# Patient Record
Sex: Male | Born: 1945
Health system: Southern US, Community
[De-identification: ages and names within clinical notes are randomized; demographics above are authoritative.]

## PROBLEM LIST (undated history)

## (undated) DIAGNOSIS — I5022 Chronic systolic (congestive) heart failure: Secondary | ICD-10-CM

## (undated) DIAGNOSIS — R042 Hemoptysis: Secondary | ICD-10-CM

## (undated) DIAGNOSIS — J9620 Acute and chronic respiratory failure, unspecified whether with hypoxia or hypercapnia: Secondary | ICD-10-CM

## (undated) DIAGNOSIS — E119 Type 2 diabetes mellitus without complications: Secondary | ICD-10-CM

## (undated) DIAGNOSIS — J90 Pleural effusion, not elsewhere classified: Secondary | ICD-10-CM

## (undated) DIAGNOSIS — D72829 Elevated white blood cell count, unspecified: Secondary | ICD-10-CM

## (undated) DIAGNOSIS — I472 Ventricular tachycardia, unspecified: Secondary | ICD-10-CM

## (undated) DIAGNOSIS — I739 Peripheral vascular disease, unspecified: Secondary | ICD-10-CM

## (undated) DIAGNOSIS — I2510 Atherosclerotic heart disease of native coronary artery without angina pectoris: Secondary | ICD-10-CM

## (undated) DIAGNOSIS — E785 Hyperlipidemia, unspecified: Secondary | ICD-10-CM

## (undated) DIAGNOSIS — I10 Essential (primary) hypertension: Secondary | ICD-10-CM

## (undated) DIAGNOSIS — I2699 Other pulmonary embolism without acute cor pulmonale: Secondary | ICD-10-CM

## (undated) DIAGNOSIS — I959 Hypotension, unspecified: Secondary | ICD-10-CM

## (undated) DIAGNOSIS — I255 Ischemic cardiomyopathy: Secondary | ICD-10-CM

## (undated) DIAGNOSIS — R7989 Other specified abnormal findings of blood chemistry: Secondary | ICD-10-CM

## (undated) DIAGNOSIS — N200 Calculus of kidney: Secondary | ICD-10-CM

## (undated) DIAGNOSIS — I82401 Acute embolism and thrombosis of unspecified deep veins of right lower extremity: Secondary | ICD-10-CM

## (undated) DIAGNOSIS — I779 Disorder of arteries and arterioles, unspecified: Secondary | ICD-10-CM

## (undated) DIAGNOSIS — M459 Ankylosing spondylitis of unspecified sites in spine: Secondary | ICD-10-CM

---

## 2011-07-17 ENCOUNTER — Inpatient Hospital Stay: Payer: Self-pay | Admitting: Internal Medicine

## 2011-07-17 LAB — CK TOTAL AND CKMB (NOT AT ARMC): CK, Total: 123 U/L (ref 35–232)

## 2011-07-17 LAB — CBC
HCT: 43.1 % (ref 40.0–52.0)
HGB: 14.3 g/dL (ref 13.0–18.0)
RBC: 4.53 10*6/uL (ref 4.40–5.90)
WBC: 13.5 10*3/uL — ABNORMAL HIGH (ref 3.8–10.6)

## 2011-07-17 LAB — PROTIME-INR
INR: 0.9
Prothrombin Time: 12.4 secs (ref 11.5–14.7)

## 2011-07-17 LAB — COMPREHENSIVE METABOLIC PANEL
Albumin: 4.1 g/dL (ref 3.4–5.0)
Alkaline Phosphatase: 102 U/L (ref 50–136)
BUN: 23 mg/dL — ABNORMAL HIGH (ref 7–18)
Bilirubin,Total: 0.4 mg/dL (ref 0.2–1.0)
Calcium, Total: 9.2 mg/dL (ref 8.5–10.1)
Chloride: 105 mmol/L (ref 98–107)
Co2: 27 mmol/L (ref 21–32)
Creatinine: 0.94 mg/dL (ref 0.60–1.30)
EGFR (Non-African Amer.): >60
Osmolality: 292 (ref 275–301)
Potassium: 3.9 mmol/L (ref 3.5–5.1)
Sodium: 143 mmol/L (ref 136–145)
Total Protein: 8.1 g/dL (ref 6.4–8.2)

## 2011-07-17 LAB — APTT: Activated PTT: 26.6 secs (ref 23.6–35.9)

## 2011-07-17 LAB — TROPONIN I: Troponin-I: 0.32 ng/mL — ABNORMAL HIGH

## 2011-07-17 LAB — LIPASE, BLOOD: Lipase: 133 U/L (ref 73–393)

## 2011-07-18 LAB — BASIC METABOLIC PANEL
Anion Gap: 13 (ref 7–16)
Calcium, Total: 8.4 mg/dL — ABNORMAL LOW (ref 8.5–10.1)
Co2: 25 mmol/L (ref 21–32)
Creatinine: 0.87 mg/dL (ref 0.60–1.30)
Osmolality: 286 (ref 275–301)

## 2011-07-18 LAB — CK TOTAL AND CKMB (NOT AT ARMC)
CK, Total: 2934 U/L — ABNORMAL HIGH (ref 35–232)
CK-MB: 406.1 ng/mL — ABNORMAL HIGH (ref 0.5–3.6)
CK-MB: 314.3 ng/mL — ABNORMAL HIGH (ref 0.5–3.6)

## 2011-07-18 LAB — TROPONIN I: Troponin-I: >40 ng/mL

## 2011-08-07 ENCOUNTER — Ambulatory Visit: Payer: Self-pay | Admitting: Internal Medicine

## 2011-08-16 ENCOUNTER — Other Ambulatory Visit: Payer: Self-pay | Admitting: Internal Medicine

## 2011-08-16 LAB — COMPREHENSIVE METABOLIC PANEL
Albumin: 4.1 g/dL (ref 3.4–5.0)
Alkaline Phosphatase: 112 U/L (ref 50–136)
Anion Gap: 6 — ABNORMAL LOW (ref 7–16)
Bilirubin,Total: 0.4 mg/dL (ref 0.2–1.0)
Co2: 25 mmol/L (ref 21–32)
Creatinine: 0.98 mg/dL (ref 0.60–1.30)
EGFR (Non-African Amer.): >60
Glucose: 118 mg/dL — ABNORMAL HIGH (ref 65–99)
Osmolality: 280 (ref 275–301)
SGOT(AST): 31 U/L (ref 15–37)
Sodium: 138 mmol/L (ref 136–145)

## 2011-08-16 LAB — LIPID PANEL
HDL Cholesterol: 47 mg/dL (ref 40–60)
VLDL Cholesterol, Calc: 17 mg/dL (ref 5–40)

## 2012-01-30 ENCOUNTER — Other Ambulatory Visit: Payer: Self-pay | Admitting: Internal Medicine

## 2012-01-30 LAB — BASIC METABOLIC PANEL
Anion Gap: 7 (ref 7–16)
BUN: 26 mg/dL — ABNORMAL HIGH (ref 7–18)
Calcium, Total: 9.3 mg/dL (ref 8.5–10.1)
Co2: 28 mmol/L (ref 21–32)
Creatinine: 1.06 mg/dL (ref 0.60–1.30)
EGFR (African American): >60
EGFR (Non-African Amer.): >60
Osmolality: 293 (ref 275–301)
Potassium: 5 mmol/L (ref 3.5–5.1)
Sodium: 144 mmol/L (ref 136–145)

## 2012-01-30 LAB — LIPID PANEL
HDL Cholesterol: 47 mg/dL (ref 40–60)
Ldl Cholesterol, Calc: 134 mg/dL — ABNORMAL HIGH (ref 0–100)
Triglycerides: 66 mg/dL (ref 0–200)
VLDL Cholesterol, Calc: 13 mg/dL (ref 5–40)

## 2012-01-31 LAB — HEPATIC FUNCTION PANEL A (ARMC)
Alkaline Phosphatase: 127 U/L (ref 50–136)
SGOT(AST): 41 U/L — ABNORMAL HIGH (ref 15–37)
SGPT (ALT): 45 U/L
Total Protein: 8.2 g/dL (ref 6.4–8.2)

## 2012-08-05 ENCOUNTER — Other Ambulatory Visit: Payer: Self-pay | Admitting: Internal Medicine

## 2012-08-05 LAB — LIPID PANEL
Ldl Cholesterol, Calc: 126 mg/dL — ABNORMAL HIGH (ref 0–100)
Triglycerides: 105 mg/dL (ref 0–200)

## 2012-08-05 LAB — CBC WITH DIFFERENTIAL/PLATELET
Eosinophil #: 0.2 10*3/uL (ref 0.0–0.7)
Eosinophil %: 2.4 %
HCT: 42.4 % (ref 40.0–52.0)
HGB: 14.5 g/dL (ref 13.0–18.0)
Lymphocyte #: 1.9 10*3/uL (ref 1.0–3.6)
Lymphocyte %: 25.3 %
MCH: 31.7 pg (ref 26.0–34.0)
MCHC: 34.1 g/dL (ref 32.0–36.0)
Monocyte %: 8.4 %
Neutrophil #: 4.8 10*3/uL (ref 1.4–6.5)
Platelet: 456 10*3/uL — ABNORMAL HIGH (ref 150–440)
RBC: 4.56 10*6/uL (ref 4.40–5.90)

## 2012-08-05 LAB — COMPREHENSIVE METABOLIC PANEL
Alkaline Phosphatase: 146 U/L — ABNORMAL HIGH (ref 50–136)
Anion Gap: 8 (ref 7–16)
BUN: 16 mg/dL (ref 7–18)
Bilirubin,Total: 0.4 mg/dL (ref 0.2–1.0)
Calcium, Total: 9.1 mg/dL (ref 8.5–10.1)
Chloride: 104 mmol/L (ref 98–107)
Co2: 27 mmol/L (ref 21–32)
EGFR (Non-African Amer.): >60
Glucose: 119 mg/dL — ABNORMAL HIGH (ref 65–99)
Potassium: 3.9 mmol/L (ref 3.5–5.1)
SGOT(AST): 31 U/L (ref 15–37)

## 2012-08-06 LAB — PSA: PSA: 1.3 ng/mL (ref 0.0–4.0)

## 2013-03-21 LAB — COMPREHENSIVE METABOLIC PANEL
Alkaline Phosphatase: 131 U/L (ref 50–136)
Anion Gap: 7 (ref 7–16)
Bilirubin,Total: 0.4 mg/dL (ref 0.2–1.0)
Chloride: 107 mmol/L (ref 98–107)
Co2: 23 mmol/L (ref 21–32)
Creatinine: 1.1 mg/dL (ref 0.60–1.30)
EGFR (African American): >60
EGFR (Non-African Amer.): >60
Glucose: 150 mg/dL — ABNORMAL HIGH (ref 65–99)
Osmolality: 278 (ref 275–301)
Potassium: 3.9 mmol/L (ref 3.5–5.1)
SGOT(AST): 38 U/L — ABNORMAL HIGH (ref 15–37)
Total Protein: 8 g/dL (ref 6.4–8.2)

## 2013-03-21 LAB — APTT: Activated PTT: 29.3 secs (ref 23.6–35.9)

## 2013-03-21 LAB — CK TOTAL AND CKMB (NOT AT ARMC)
CK, Total: 249 U/L — ABNORMAL HIGH (ref 35–232)
CK, Total: 195 U/L (ref 35–232)
CK-MB: 1.8 ng/mL (ref 0.5–3.6)

## 2013-03-21 LAB — CBC
HGB: 14.4 g/dL (ref 13.0–18.0)
MCH: 32.2 pg (ref 26.0–34.0)
Platelet: 380 10*3/uL (ref 150–440)
RDW: 13.5 % (ref 11.5–14.5)

## 2013-03-21 LAB — TROPONIN I
Troponin-I: 0.03 ng/mL
Troponin-I: 0.16 ng/mL — ABNORMAL HIGH

## 2013-03-22 ENCOUNTER — Inpatient Hospital Stay: Payer: Self-pay | Admitting: Internal Medicine

## 2013-03-22 LAB — TSH: Thyroid Stimulating Horm: 4.12 u[IU]/mL

## 2013-03-22 LAB — CK TOTAL AND CKMB (NOT AT ARMC): CK-MB: 1.8 ng/mL (ref 0.5–3.6)

## 2013-03-22 LAB — TROPONIN I: Troponin-I: 0.12 ng/mL — ABNORMAL HIGH

## 2013-03-23 LAB — BASIC METABOLIC PANEL
Anion Gap: 7 (ref 7–16)
Calcium, Total: 8.9 mg/dL (ref 8.5–10.1)
Chloride: 106 mmol/L (ref 98–107)
Co2: 24 mmol/L (ref 21–32)
EGFR (African American): >60
Osmolality: 276 (ref 275–301)
Potassium: 4.1 mmol/L (ref 3.5–5.1)
Sodium: 137 mmol/L (ref 136–145)

## 2013-03-23 LAB — CBC WITH DIFFERENTIAL/PLATELET
Basophil #: 0.1 10*3/uL (ref 0.0–0.1)
Basophil %: 0.7 %
Eosinophil #: 0.2 10*3/uL (ref 0.0–0.7)
HCT: 38.8 % — ABNORMAL LOW (ref 40.0–52.0)
HGB: 13.5 g/dL (ref 13.0–18.0)
Lymphocyte #: 3.1 10*3/uL (ref 1.0–3.6)
Lymphocyte %: 28.6 %
MCHC: 34.8 g/dL (ref 32.0–36.0)
MCV: 93 fL (ref 80–100)
Monocyte %: 9.4 %
Neutrophil #: 6.4 10*3/uL (ref 1.4–6.5)
RBC: 4.19 10*6/uL — ABNORMAL LOW (ref 4.40–5.90)

## 2013-03-23 LAB — LIPID PANEL
Ldl Cholesterol, Calc: 93 mg/dL (ref 0–100)
VLDL Cholesterol, Calc: 23 mg/dL (ref 5–40)

## 2013-03-24 LAB — BASIC METABOLIC PANEL
Glucose: 110 mg/dL — ABNORMAL HIGH (ref 65–99)
Osmolality: 277 (ref 275–301)
Potassium: 4.2 mmol/L (ref 3.5–5.1)

## 2013-09-28 DIAGNOSIS — I209 Angina pectoris, unspecified: Secondary | ICD-10-CM | POA: Diagnosis not present

## 2013-09-28 DIAGNOSIS — I509 Heart failure, unspecified: Secondary | ICD-10-CM | POA: Diagnosis not present

## 2013-09-28 DIAGNOSIS — I10 Essential (primary) hypertension: Secondary | ICD-10-CM | POA: Diagnosis not present

## 2013-11-11 DIAGNOSIS — E785 Hyperlipidemia, unspecified: Secondary | ICD-10-CM | POA: Diagnosis not present

## 2013-11-11 DIAGNOSIS — Z7982 Long term (current) use of aspirin: Secondary | ICD-10-CM | POA: Diagnosis not present

## 2013-11-11 DIAGNOSIS — I10 Essential (primary) hypertension: Secondary | ICD-10-CM | POA: Diagnosis not present

## 2013-11-18 DIAGNOSIS — I10 Essential (primary) hypertension: Secondary | ICD-10-CM | POA: Diagnosis not present

## 2013-11-18 DIAGNOSIS — I2510 Atherosclerotic heart disease of native coronary artery without angina pectoris: Secondary | ICD-10-CM | POA: Diagnosis not present

## 2013-11-18 DIAGNOSIS — E785 Hyperlipidemia, unspecified: Secondary | ICD-10-CM | POA: Diagnosis not present

## 2014-03-10 DIAGNOSIS — H8110 Benign paroxysmal vertigo, unspecified ear: Secondary | ICD-10-CM | POA: Diagnosis not present

## 2014-03-10 DIAGNOSIS — R42 Dizziness and giddiness: Secondary | ICD-10-CM | POA: Diagnosis not present

## 2014-03-10 DIAGNOSIS — E785 Hyperlipidemia, unspecified: Secondary | ICD-10-CM | POA: Diagnosis not present

## 2014-03-10 DIAGNOSIS — I10 Essential (primary) hypertension: Secondary | ICD-10-CM | POA: Diagnosis not present

## 2014-03-24 DIAGNOSIS — R42 Dizziness and giddiness: Secondary | ICD-10-CM | POA: Diagnosis not present

## 2014-04-06 ENCOUNTER — Ambulatory Visit: Payer: Self-pay | Admitting: Neurology

## 2014-04-09 DIAGNOSIS — R42 Dizziness and giddiness: Secondary | ICD-10-CM | POA: Diagnosis not present

## 2014-05-06 DIAGNOSIS — R569 Unspecified convulsions: Secondary | ICD-10-CM | POA: Diagnosis not present

## 2014-05-06 DIAGNOSIS — R42 Dizziness and giddiness: Secondary | ICD-10-CM | POA: Diagnosis not present

## 2014-05-24 DIAGNOSIS — I10 Essential (primary) hypertension: Secondary | ICD-10-CM | POA: Diagnosis not present

## 2014-06-01 DIAGNOSIS — I209 Angina pectoris, unspecified: Secondary | ICD-10-CM | POA: Diagnosis not present

## 2014-06-01 DIAGNOSIS — R1013 Epigastric pain: Secondary | ICD-10-CM | POA: Diagnosis not present

## 2014-06-01 DIAGNOSIS — I10 Essential (primary) hypertension: Secondary | ICD-10-CM | POA: Diagnosis not present

## 2014-06-09 DIAGNOSIS — I2510 Atherosclerotic heart disease of native coronary artery without angina pectoris: Secondary | ICD-10-CM | POA: Diagnosis not present

## 2014-06-09 DIAGNOSIS — I429 Cardiomyopathy, unspecified: Secondary | ICD-10-CM | POA: Diagnosis not present

## 2014-06-09 DIAGNOSIS — I200 Unstable angina: Secondary | ICD-10-CM | POA: Diagnosis not present

## 2014-06-09 DIAGNOSIS — R079 Chest pain, unspecified: Secondary | ICD-10-CM | POA: Diagnosis not present

## 2014-06-12 ENCOUNTER — Inpatient Hospital Stay (HOSPITAL_COMMUNITY)
Admission: AD | Admit: 2014-06-12 | Discharge: 2014-06-17 | DRG: 270 | Disposition: A | Discharge status: Home / Self Care | Payer: Medicare Other | Source: Other Acute Inpatient Hospital | Attending: Cardiovascular Disease | Admitting: Cardiovascular Disease

## 2014-06-12 ENCOUNTER — Inpatient Hospital Stay (HOSPITAL_COMMUNITY): Payer: Medicare Other

## 2014-06-12 ENCOUNTER — Encounter (HOSPITAL_COMMUNITY)
Admission: AD | Disposition: A | Discharge status: Home / Self Care | Payer: Medicare Other | Source: Other Acute Inpatient Hospital | Attending: Cardiovascular Disease

## 2014-06-12 ENCOUNTER — Other Ambulatory Visit: Payer: Self-pay

## 2014-06-12 ENCOUNTER — Emergency Department: Payer: Self-pay | Admitting: Emergency Medicine

## 2014-06-12 ENCOUNTER — Encounter (HOSPITAL_COMMUNITY): Payer: Self-pay | Admitting: Cardiology

## 2014-06-12 DIAGNOSIS — J9600 Acute respiratory failure, unspecified whether with hypoxia or hypercapnia: Secondary | ICD-10-CM | POA: Diagnosis not present

## 2014-06-12 DIAGNOSIS — E875 Hyperkalemia: Secondary | ICD-10-CM | POA: Diagnosis present

## 2014-06-12 DIAGNOSIS — K7200 Acute and subacute hepatic failure without coma: Secondary | ICD-10-CM | POA: Diagnosis not present

## 2014-06-12 DIAGNOSIS — Z7982 Long term (current) use of aspirin: Secondary | ICD-10-CM | POA: Diagnosis not present

## 2014-06-12 DIAGNOSIS — M459 Ankylosing spondylitis of unspecified sites in spine: Secondary | ICD-10-CM | POA: Diagnosis present

## 2014-06-12 DIAGNOSIS — Z955 Presence of coronary angioplasty implant and graft: Secondary | ICD-10-CM

## 2014-06-12 DIAGNOSIS — E119 Type 2 diabetes mellitus without complications: Secondary | ICD-10-CM | POA: Diagnosis present

## 2014-06-12 DIAGNOSIS — I252 Old myocardial infarction: Secondary | ICD-10-CM | POA: Diagnosis not present

## 2014-06-12 DIAGNOSIS — K922 Gastrointestinal hemorrhage, unspecified: Secondary | ICD-10-CM | POA: Diagnosis present

## 2014-06-12 DIAGNOSIS — I2582 Chronic total occlusion of coronary artery: Secondary | ICD-10-CM | POA: Diagnosis present

## 2014-06-12 DIAGNOSIS — I213 ST elevation (STEMI) myocardial infarction of unspecified site: Secondary | ICD-10-CM | POA: Diagnosis not present

## 2014-06-12 DIAGNOSIS — Z0189 Encounter for other specified special examinations: Secondary | ICD-10-CM

## 2014-06-12 DIAGNOSIS — R570 Cardiogenic shock: Secondary | ICD-10-CM | POA: Diagnosis present

## 2014-06-12 DIAGNOSIS — I2102 ST elevation (STEMI) myocardial infarction involving left anterior descending coronary artery: Secondary | ICD-10-CM | POA: Diagnosis not present

## 2014-06-12 DIAGNOSIS — E872 Acidosis: Secondary | ICD-10-CM | POA: Diagnosis present

## 2014-06-12 DIAGNOSIS — J9601 Acute respiratory failure with hypoxia: Secondary | ICD-10-CM | POA: Diagnosis not present

## 2014-06-12 DIAGNOSIS — J9602 Acute respiratory failure with hypercapnia: Secondary | ICD-10-CM | POA: Insufficient documentation

## 2014-06-12 DIAGNOSIS — Z452 Encounter for adjustment and management of vascular access device: Secondary | ICD-10-CM | POA: Diagnosis not present

## 2014-06-12 DIAGNOSIS — R100 Acute abdomen: Secondary | ICD-10-CM | POA: Diagnosis not present

## 2014-06-12 DIAGNOSIS — I10 Essential (primary) hypertension: Secondary | ICD-10-CM | POA: Diagnosis present

## 2014-06-12 DIAGNOSIS — I472 Ventricular tachycardia, unspecified: Secondary | ICD-10-CM | POA: Insufficient documentation

## 2014-06-12 DIAGNOSIS — I2510 Atherosclerotic heart disease of native coronary artery without angina pectoris: Secondary | ICD-10-CM | POA: Diagnosis present

## 2014-06-12 DIAGNOSIS — I517 Cardiomegaly: Secondary | ICD-10-CM | POA: Diagnosis not present

## 2014-06-12 DIAGNOSIS — R109 Unspecified abdominal pain: Secondary | ICD-10-CM | POA: Insufficient documentation

## 2014-06-12 DIAGNOSIS — I2129 ST elevation (STEMI) myocardial infarction involving other sites: Secondary | ICD-10-CM | POA: Diagnosis not present

## 2014-06-12 DIAGNOSIS — R61 Generalized hyperhidrosis: Secondary | ICD-10-CM | POA: Diagnosis not present

## 2014-06-12 DIAGNOSIS — E785 Hyperlipidemia, unspecified: Secondary | ICD-10-CM | POA: Diagnosis present

## 2014-06-12 DIAGNOSIS — N200 Calculus of kidney: Secondary | ICD-10-CM | POA: Diagnosis not present

## 2014-06-12 DIAGNOSIS — J810 Acute pulmonary edema: Secondary | ICD-10-CM | POA: Diagnosis not present

## 2014-06-12 DIAGNOSIS — I5021 Acute systolic (congestive) heart failure: Secondary | ICD-10-CM | POA: Diagnosis present

## 2014-06-12 DIAGNOSIS — R001 Bradycardia, unspecified: Secondary | ICD-10-CM | POA: Diagnosis not present

## 2014-06-12 DIAGNOSIS — I2109 ST elevation (STEMI) myocardial infarction involving other coronary artery of anterior wall: Principal | ICD-10-CM | POA: Diagnosis present

## 2014-06-12 DIAGNOSIS — J9690 Respiratory failure, unspecified, unspecified whether with hypoxia or hypercapnia: Secondary | ICD-10-CM

## 2014-06-12 DIAGNOSIS — I270 Primary pulmonary hypertension: Secondary | ICD-10-CM | POA: Diagnosis not present

## 2014-06-12 DIAGNOSIS — J811 Chronic pulmonary edema: Secondary | ICD-10-CM | POA: Diagnosis not present

## 2014-06-12 HISTORY — DX: Hyperlipidemia, unspecified: E78.5

## 2014-06-12 HISTORY — DX: Type 2 diabetes mellitus without complications: E11.9

## 2014-06-12 HISTORY — DX: Essential (primary) hypertension: I10

## 2014-06-12 HISTORY — PX: LEFT HEART CATHETERIZATION WITH CORONARY ANGIOGRAM: SHX5451

## 2014-06-12 LAB — COMPREHENSIVE METABOLIC PANEL
ALBUMIN: 3.9 g/dL (ref 3.4–5.0)
ALBUMIN: 3.7 g/dL (ref 3.5–5.2)
ALK PHOS: 119 U/L — AB
ALT: 74 U/L — AB (ref 0–53)
ANION GAP: 7 (ref 7–16)
AST: 20 U/L (ref 15–37)
AST: 354 U/L — ABNORMAL HIGH (ref 0–37)
Alkaline Phosphatase: 123 U/L — ABNORMAL HIGH (ref 39–117)
Anion gap: 15 (ref 5–15)
BILIRUBIN TOTAL: 0.4 mg/dL (ref 0.2–1.0)
BUN: 20 mg/dL — AB (ref 7–18)
BUN: 19 mg/dL (ref 6–23)
CO2: 19 mEq/L (ref 19–32)
Calcium, Total: 8.8 mg/dL (ref 8.5–10.1)
Calcium: 7.8 mg/dL — ABNORMAL LOW (ref 8.4–10.5)
Chloride: 106 mmol/L (ref 98–107)
Chloride: 104 mEq/L (ref 96–112)
Co2: 23 mmol/L (ref 21–32)
Creatinine, Ser: 1.01 mg/dL (ref 0.50–1.35)
Creatinine: 1.35 mg/dL — ABNORMAL HIGH (ref 0.60–1.30)
EGFR (African American): >60
EGFR (Non-African Amer.): 56 — ABNORMAL LOW
GFR calc Af Amer: 87 mL/min — ABNORMAL LOW (ref >90)
GFR calc non Af Amer: 75 mL/min — ABNORMAL LOW (ref >90)
GLUCOSE: 180 mg/dL — AB (ref 65–99)
Glucose, Bld: 211 mg/dL — ABNORMAL HIGH (ref 70–99)
Osmolality: 279 (ref 275–301)
POTASSIUM: 3.8 mmol/L (ref 3.5–5.1)
POTASSIUM: 4.5 meq/L (ref 3.7–5.3)
SGPT (ALT): 35 U/L
SODIUM: 136 mmol/L (ref 136–145)
SODIUM: 138 meq/L (ref 137–147)
TOTAL PROTEIN: 7.2 g/dL (ref 6.0–8.3)
Total Bilirubin: 0.4 mg/dL (ref 0.3–1.2)
Total Protein: 7.8 g/dL (ref 6.4–8.2)

## 2014-06-12 LAB — CBC WITH DIFFERENTIAL/PLATELET
BASOS ABS: 0 10*3/uL (ref 0.0–0.1)
BASOS PCT: 0 % (ref 0–1)
Basophils Absolute: 0 10*3/uL (ref 0.0–0.1)
Basophils Relative: 0 % (ref 0–1)
EOS ABS: 0 10*3/uL (ref 0.0–0.7)
EOS ABS: 0 10*3/uL (ref 0.0–0.7)
EOS PCT: 0 % (ref 0–5)
Eosinophils Relative: 0 % (ref 0–5)
HCT: 40.4 % (ref 39.0–52.0)
HCT: 37.8 % — ABNORMAL LOW (ref 39.0–52.0)
Hemoglobin: 13.6 g/dL (ref 13.0–17.0)
Hemoglobin: 12.7 g/dL — ABNORMAL LOW (ref 13.0–17.0)
LYMPHS PCT: 7 % — AB (ref 12–46)
Lymphocytes Relative: 4 % — ABNORMAL LOW (ref 12–46)
Lymphs Abs: 0.8 10*3/uL (ref 0.7–4.0)
Lymphs Abs: 1.5 10*3/uL (ref 0.7–4.0)
MCH: 31.7 pg (ref 26.0–34.0)
MCH: 31.4 pg (ref 26.0–34.0)
MCHC: 33.7 g/dL (ref 30.0–36.0)
MCHC: 33.6 g/dL (ref 30.0–36.0)
MCV: 94.2 fL (ref 78.0–100.0)
MCV: 93.3 fL (ref 78.0–100.0)
Monocytes Absolute: 0.6 10*3/uL (ref 0.1–1.0)
Monocytes Absolute: 2 10*3/uL — ABNORMAL HIGH (ref 0.1–1.0)
Monocytes Relative: 3 % (ref 3–12)
Monocytes Relative: 9 % (ref 3–12)
NEUTROS PCT: 93 % — AB (ref 43–77)
Neutro Abs: 18.1 10*3/uL — ABNORMAL HIGH (ref 1.7–7.7)
Neutro Abs: 18.8 10*3/uL — ABNORMAL HIGH (ref 1.7–7.7)
Neutrophils Relative %: 84 % — ABNORMAL HIGH (ref 43–77)
PLATELETS: 422 10*3/uL — AB (ref 150–400)
PLATELETS: 412 10*3/uL — AB (ref 150–400)
RBC: 4.29 MIL/uL (ref 4.22–5.81)
RBC: 4.05 MIL/uL — ABNORMAL LOW (ref 4.22–5.81)
RDW: 13.3 % (ref 11.5–15.5)
RDW: 13.4 % (ref 11.5–15.5)
WBC: 19.6 10*3/uL — ABNORMAL HIGH (ref 4.0–10.5)
WBC: 22.3 10*3/uL — AB (ref 4.0–10.5)

## 2014-06-12 LAB — CBC
HCT: 41.4 % (ref 40.0–52.0)
HCT: 31.1 % — ABNORMAL LOW (ref 39.0–52.0)
HGB: 13.6 g/dL (ref 13.0–18.0)
Hemoglobin: 10.4 g/dL — ABNORMAL LOW (ref 13.0–17.0)
MCH: 31.7 pg (ref 26.0–34.0)
MCH: 31.4 pg (ref 26.0–34.0)
MCHC: 32.9 g/dL (ref 32.0–36.0)
MCHC: 33.4 g/dL (ref 30.0–36.0)
MCV: 96 fL (ref 80–100)
MCV: 94 fL (ref 78.0–100.0)
PLATELETS: 431 10*3/uL (ref 150–440)
Platelets: 326 10*3/uL (ref 150–400)
RBC: 4.3 10*6/uL — ABNORMAL LOW (ref 4.40–5.90)
RBC: 3.31 MIL/uL — ABNORMAL LOW (ref 4.22–5.81)
RDW: 13.3 % (ref 11.5–14.5)
RDW: 13.3 % (ref 11.5–15.5)
WBC: 10.1 10*3/uL (ref 3.8–10.6)
WBC: 17.4 10*3/uL — ABNORMAL HIGH (ref 4.0–10.5)

## 2014-06-12 LAB — GLUCOSE, CAPILLARY
GLUCOSE-CAPILLARY: 237 mg/dL — AB (ref 70–99)
Glucose-Capillary: 242 mg/dL — ABNORMAL HIGH (ref 70–99)
Glucose-Capillary: 240 mg/dL — ABNORMAL HIGH (ref 70–99)

## 2014-06-12 LAB — MRSA PCR SCREENING: MRSA BY PCR: NEGATIVE

## 2014-06-12 LAB — POCT I-STAT 3, ART BLOOD GAS (G3+)
Acid-base deficit: 8 mmol/L — ABNORMAL HIGH (ref 0.0–2.0)
Bicarbonate: 18.9 mEq/L — ABNORMAL LOW (ref 20.0–24.0)
O2 Saturation: 93 %
PCO2 ART: 41.7 mmHg (ref 35.0–45.0)
PH ART: 7.266 — AB (ref 7.350–7.450)
PO2 ART: 78 mmHg — AB (ref 80.0–100.0)
Patient temperature: 98.7
TCO2: 20 mmol/L (ref 0–100)

## 2014-06-12 LAB — BASIC METABOLIC PANEL
Anion gap: 13 (ref 5–15)
BUN: 14 mg/dL (ref 6–23)
CALCIUM: 5.7 mg/dL — AB (ref 8.4–10.5)
CO2: 14 mEq/L — ABNORMAL LOW (ref 19–32)
Chloride: 104 mEq/L (ref 96–112)
Creatinine, Ser: 0.68 mg/dL (ref 0.50–1.35)
GFR calc Af Amer: >90 mL/min (ref >90)
GLUCOSE: 175 mg/dL — AB (ref 70–99)
Potassium: 2.9 mEq/L — CL (ref 3.7–5.3)
SODIUM: 131 meq/L — AB (ref 137–147)

## 2014-06-12 LAB — TROPONIN I
Troponin I: >20 ng/mL (ref <0.30)
Troponin-I: <0.02 ng/mL

## 2014-06-12 LAB — LACTIC ACID, PLASMA: Lactic Acid, Venous: 2.9 mmol/L — ABNORMAL HIGH (ref 0.5–2.2)

## 2014-06-12 LAB — MAGNESIUM: Magnesium: 1.8 mg/dL (ref 1.5–2.5)

## 2014-06-12 LAB — PROTIME-INR
INR: 0.9
Prothrombin Time: 12.1 secs (ref 11.5–14.7)

## 2014-06-12 LAB — PHOSPHORUS: PHOSPHORUS: 3.6 mg/dL (ref 2.3–4.6)

## 2014-06-12 LAB — CK TOTAL AND CKMB (NOT AT ARMC)
CK, TOTAL: 111 U/L (ref 39–308)
CK-MB: 1.8 ng/mL (ref 0.5–3.6)

## 2014-06-12 LAB — APTT: ACTIVATED PTT: 23.6 s (ref 23.6–35.9)

## 2014-06-12 SURGERY — LEFT HEART CATHETERIZATION WITH CORONARY ANGIOGRAM
Anesthesia: LOCAL

## 2014-06-12 MED ORDER — SODIUM CHLORIDE 0.9 % IJ SOLN: 10.0000 mL | INTRAMUSCULAR | Status: DC | PRN

## 2014-06-12 MED ORDER — MIDAZOLAM HCL 2 MG/2ML IJ SOLN
2.0000 mg | INTRAMUSCULAR | Status: DC | PRN
  Administered 2014-06-12: 2 mg via INTRAVENOUS

## 2014-06-12 MED ORDER — MIDAZOLAM HCL 2 MG/2ML IJ SOLN
INTRAMUSCULAR | Status: AC
  Administered 2014-06-12: 14:00:00
  Filled 2014-06-12: qty 2

## 2014-06-12 MED ORDER — DEXTROSE 50 % IV SOLN
INTRAVENOUS | Status: AC
  Administered 2014-06-12: 50 mL via INTRAVENOUS
  Filled 2014-06-12: qty 50

## 2014-06-12 MED ORDER — AMIODARONE LOAD VIA INFUSION
150.0000 mg | Freq: Once | INTRAVENOUS | Status: AC
  Administered 2014-06-12: 150 mg via INTRAVENOUS
  Filled 2014-06-12: qty 83.34

## 2014-06-12 MED ORDER — ETOMIDATE 2 MG/ML IV SOLN
INTRAVENOUS | Status: AC
  Administered 2014-06-12: 20 mg via INTRAVENOUS
  Filled 2014-06-12: qty 20

## 2014-06-12 MED ORDER — FENTANYL BOLUS VIA INFUSION
25.0000 ug | INTRAVENOUS | Status: DC | PRN
  Filled 2014-06-12: qty 25

## 2014-06-12 MED ORDER — SODIUM CHLORIDE 0.9 % IV SOLN
0.2500 mg/kg/h | INTRAVENOUS | Status: DC
  Filled 2014-06-12: qty 250

## 2014-06-12 MED ORDER — CLOPIDOGREL BISULFATE 75 MG PO TABS
75.0000 mg | ORAL_TABLET | Freq: Every day | ORAL | Status: DC
  Administered 2014-06-13 – 2014-06-17 (×5): 75 mg via ORAL
  Filled 2014-06-12 (×6): qty 1

## 2014-06-12 MED ORDER — VASOPRESSIN 20 UNIT/ML IV SOLN
0.0300 [IU]/min | INTRAVENOUS | Status: DC
  Administered 2014-06-12: 0.03 [IU]/min via INTRAVENOUS
  Filled 2014-06-12: qty 2

## 2014-06-12 MED ORDER — AMIODARONE HCL IN DEXTROSE 360-4.14 MG/200ML-% IV SOLN
30.0000 mg/h | INTRAVENOUS | Status: DC
Start: 2014-06-12 — End: 2014-06-14
  Administered 2014-06-13 – 2014-06-14 (×3): 30 mg/h via INTRAVENOUS
  Filled 2014-06-12 (×4): qty 200

## 2014-06-12 MED ORDER — FENTANYL CITRATE 0.05 MG/ML IJ SOLN: 50.0000 ug | Freq: Once | INTRAMUSCULAR | Status: DC

## 2014-06-12 MED ORDER — SODIUM CHLORIDE 0.9 % IV SOLN
1.7500 mg/kg/h | Freq: Once | INTRAVENOUS | Status: AC
  Administered 2014-06-12: 1.75 mg/kg/h via INTRAVENOUS
  Filled 2014-06-12: qty 250

## 2014-06-12 MED ORDER — MIDAZOLAM HCL 2 MG/2ML IJ SOLN
INTRAMUSCULAR | Status: AC
  Filled 2014-06-12: qty 2

## 2014-06-12 MED ORDER — CHLORHEXIDINE GLUCONATE 0.12 % MT SOLN
15.0000 mL | Freq: Two times a day (BID) | OROMUCOSAL | Status: DC
Start: 2014-06-12 — End: 2014-06-14
  Administered 2014-06-12 – 2014-06-13 (×3): 15 mL via OROMUCOSAL
  Filled 2014-06-12 (×3): qty 15

## 2014-06-12 MED ORDER — MAGNESIUM SULFATE 2 GM/50ML IV SOLN: 2.0000 g | Freq: Once | INTRAVENOUS | Status: DC

## 2014-06-12 MED ORDER — PANTOPRAZOLE SODIUM 40 MG IV SOLR: 40.0000 mg | Freq: Two times a day (BID) | INTRAVENOUS | Status: DC

## 2014-06-12 MED ORDER — MIDAZOLAM HCL 2 MG/2ML IJ SOLN
2.0000 mg | Freq: Once | INTRAMUSCULAR | Status: AC
  Administered 2014-06-12: 2 mg via INTRAVENOUS

## 2014-06-12 MED ORDER — INSULIN ASPART 100 UNIT/ML ~~LOC~~ SOLN
10.0000 [IU] | Freq: Once | SUBCUTANEOUS | Status: AC
Start: 2014-06-12 — End: 2014-06-12
  Administered 2014-06-12: 10 [IU] via INTRAVENOUS

## 2014-06-12 MED ORDER — NOREPINEPHRINE BITARTRATE 1 MG/ML IV SOLN
0.0000 ug/min | INTRAVENOUS | Status: DC
  Administered 2014-06-12: 50 ug/min via INTRAVENOUS
  Administered 2014-06-12: 45 ug/min via INTRAVENOUS
  Administered 2014-06-12: 20 ug/min via INTRAVENOUS
  Administered 2014-06-13: 30 ug/min via INTRAVENOUS
  Administered 2014-06-13: 19 ug/min via INTRAVENOUS
  Filled 2014-06-12 (×7): qty 16

## 2014-06-12 MED ORDER — SODIUM BICARBONATE 8.4 % IV SOLN
50.0000 meq | Freq: Once | INTRAVENOUS | Status: AC
  Administered 2014-06-12: 50 meq via INTRAVENOUS
  Filled 2014-06-12: qty 50

## 2014-06-12 MED ORDER — TIROFIBAN HCL IV 5 MG/100ML
0.1500 ug/kg/min | INTRAVENOUS | Status: DC
  Administered 2014-06-12 (×2): 0.15 ug/kg/min via INTRAVENOUS
  Filled 2014-06-12 (×4): qty 100

## 2014-06-12 MED ORDER — FENTANYL CITRATE 0.05 MG/ML IJ SOLN
25.0000 ug/h | INTRAMUSCULAR | Status: DC
  Administered 2014-06-12: 25 ug/h via INTRAVENOUS
  Filled 2014-06-12 (×2): qty 50

## 2014-06-12 MED ORDER — SODIUM BICARBONATE 8.4 % IV SOLN
INTRAVENOUS | Status: AC
  Administered 2014-06-12: 50 meq via INTRAVENOUS
  Filled 2014-06-12: qty 50

## 2014-06-12 MED ORDER — PANTOPRAZOLE SODIUM 40 MG IV SOLR
40.0000 mg | INTRAVENOUS | Status: DC
  Administered 2014-06-12: 40 mg via INTRAVENOUS
  Filled 2014-06-12: qty 40

## 2014-06-12 MED ORDER — AMIODARONE IV BOLUS ONLY 150 MG/100ML: 150.0000 mg | Freq: Once | INTRAVENOUS | Status: DC

## 2014-06-12 MED ORDER — FUROSEMIDE 10 MG/ML IJ SOLN
INTRAMUSCULAR | Status: AC
  Administered 2014-06-12: 80 mg via INTRAVENOUS
  Filled 2014-06-12: qty 8

## 2014-06-12 MED ORDER — MIDAZOLAM HCL 2 MG/2ML IJ SOLN
INTRAMUSCULAR | Status: AC
  Administered 2014-06-12: 2 mg via INTRAVENOUS
  Filled 2014-06-12: qty 2

## 2014-06-12 MED ORDER — FENTANYL CITRATE 0.05 MG/ML IJ SOLN
INTRAMUSCULAR | Status: AC
  Filled 2014-06-12: qty 2

## 2014-06-12 MED ORDER — ATORVASTATIN CALCIUM 80 MG PO TABS
80.0000 mg | ORAL_TABLET | Freq: Every day | ORAL | Status: DC
  Filled 2014-06-12: qty 1

## 2014-06-12 MED ORDER — FENTANYL CITRATE 0.05 MG/ML IJ SOLN
100.0000 ug | Freq: Once | INTRAMUSCULAR | Status: AC
Start: 2014-06-12 — End: 2014-06-12
  Administered 2014-06-12: 100 ug via INTRAVENOUS
  Filled 2014-06-12: qty 2

## 2014-06-12 MED ORDER — INSULIN ASPART 100 UNIT/ML ~~LOC~~ SOLN
0.0000 [IU] | SUBCUTANEOUS | Status: DC
  Administered 2014-06-12 (×3): 3 [IU] via SUBCUTANEOUS
  Administered 2014-06-13: 2 [IU] via SUBCUTANEOUS
  Administered 2014-06-13 (×2): 1 [IU] via SUBCUTANEOUS
  Administered 2014-06-13 (×2): 2 [IU] via SUBCUTANEOUS
  Administered 2014-06-14 (×2): 1 [IU] via SUBCUTANEOUS

## 2014-06-12 MED ORDER — LIDOCAINE HCL (PF) 2 % IJ SOLN: 100.0000 mg | Freq: Once | INTRAMUSCULAR | Status: DC

## 2014-06-12 MED ORDER — FENTANYL CITRATE 0.05 MG/ML IJ SOLN
100.0000 ug | Freq: Once | INTRAMUSCULAR | Status: AC
  Administered 2014-06-12: 100 ug via INTRAVENOUS

## 2014-06-12 MED ORDER — AMIODARONE HCL IN DEXTROSE 360-4.14 MG/200ML-% IV SOLN
60.0000 mg/h | INTRAVENOUS | Status: AC
Start: 2014-06-12 — End: 2014-06-12
  Administered 2014-06-12: 60 mg/h via INTRAVENOUS
  Filled 2014-06-12 (×2): qty 200

## 2014-06-12 MED ORDER — SODIUM CHLORIDE 0.9 % IV BOLUS (SEPSIS)
500.0000 mL | Freq: Once | INTRAVENOUS | Status: AC
  Administered 2014-06-12: 500 mL via INTRAVENOUS

## 2014-06-12 MED ORDER — CALCIUM CHLORIDE 10 % IV SOLN
1.0000 g | Freq: Once | INTRAVENOUS | Status: AC
  Administered 2014-06-12: 1 g via INTRAVENOUS

## 2014-06-12 MED ORDER — DEXTROSE 50 % IV SOLN
1.0000 | Freq: Once | INTRAVENOUS | Status: AC
  Administered 2014-06-12: 50 mL via INTRAVENOUS

## 2014-06-12 MED ORDER — ETOMIDATE 2 MG/ML IV SOLN
20.0000 mg | Freq: Once | INTRAVENOUS | Status: AC
  Administered 2014-06-12: 20 mg via INTRAVENOUS

## 2014-06-12 MED ORDER — ACETAMINOPHEN 325 MG PO TABS
650.0000 mg | ORAL_TABLET | ORAL | Status: DC | PRN
  Filled 2014-06-12: qty 2

## 2014-06-12 MED ORDER — ASPIRIN EC 81 MG PO TBEC: 81.0000 mg | DELAYED_RELEASE_TABLET | Freq: Every day | ORAL | Status: DC

## 2014-06-12 MED ORDER — MAGNESIUM SULFATE 2 GM/50ML IV SOLN
2.0000 g | Freq: Once | INTRAVENOUS | Status: AC
  Administered 2014-06-12: 2 g via INTRAVENOUS

## 2014-06-12 MED ORDER — SODIUM BICARBONATE 8.4 % IV SOLN
50.0000 meq | Freq: Once | INTRAVENOUS | Status: AC
  Administered 2014-06-12: 50 meq via INTRAVENOUS

## 2014-06-12 MED ORDER — SODIUM CHLORIDE 0.9 % IV SOLN
8.0000 mg/h | INTRAVENOUS | Status: DC
  Administered 2014-06-12 – 2014-06-14 (×4): 8 mg/h via INTRAVENOUS
  Filled 2014-06-12 (×8): qty 80

## 2014-06-12 MED ORDER — PANTOPRAZOLE SODIUM 40 MG IV SOLR
40.0000 mg | Freq: Once | INTRAVENOUS | Status: AC
  Administered 2014-06-12: 40 mg via INTRAVENOUS
  Filled 2014-06-12: qty 40

## 2014-06-12 MED ORDER — SUCCINYLCHOLINE CHLORIDE 20 MG/ML IJ SOLN
INTRAMUSCULAR | Status: AC
  Filled 2014-06-12: qty 1

## 2014-06-12 MED ORDER — HEPARIN (PORCINE) IN NACL 100-0.45 UNIT/ML-% IJ SOLN
1050.0000 [IU]/h | INTRAMUSCULAR | Status: DC
  Administered 2014-06-12: 800 [IU]/h via INTRAVENOUS
  Administered 2014-06-13: 950 [IU]/h via INTRAVENOUS
  Filled 2014-06-12 (×3): qty 250

## 2014-06-12 MED ORDER — HEPARIN (PORCINE) IN NACL 100-0.45 UNIT/ML-% IJ SOLN
800.0000 [IU]/h | INTRAMUSCULAR | Status: DC
  Administered 2014-06-12: 800 [IU]/h via INTRAVENOUS
  Filled 2014-06-12 (×2): qty 250

## 2014-06-12 MED ORDER — NOREPINEPHRINE BITARTRATE 1 MG/ML IV SOLN
0.0000 ug/min | Freq: Once | INTRAVENOUS | Status: AC
Start: 2014-06-12 — End: 2014-06-12
  Administered 2014-06-12: 20 ug/min via INTRAVENOUS
  Filled 2014-06-12: qty 4

## 2014-06-12 MED ORDER — CETYLPYRIDINIUM CHLORIDE 0.05 % MT LIQD
7.0000 mL | Freq: Four times a day (QID) | OROMUCOSAL | Status: DC
Start: 2014-06-13 — End: 2014-06-14
  Administered 2014-06-13 – 2014-06-14 (×6): 7 mL via OROMUCOSAL

## 2014-06-12 MED ORDER — SODIUM CHLORIDE 0.9 % IV SOLN
25.0000 ug/h | INTRAVENOUS | Status: DC
  Filled 2014-06-12 (×2): qty 50

## 2014-06-12 MED ORDER — MIDAZOLAM HCL 2 MG/2ML IJ SOLN
1.0000 mg | INTRAMUSCULAR | Status: DC | PRN
  Administered 2014-06-12: 1 mg via INTRAVENOUS
  Filled 2014-06-12: qty 2

## 2014-06-12 MED ORDER — LIDOCAINE HCL (CARDIAC) 20 MG/ML IV SOLN
INTRAVENOUS | Status: AC
  Filled 2014-06-12: qty 5

## 2014-06-12 MED ORDER — HEPARIN (PORCINE) IN NACL 100-0.45 UNIT/ML-% IJ SOLN: 900.0000 [IU]/h | INTRAMUSCULAR | Status: DC

## 2014-06-12 MED ORDER — POTASSIUM CHLORIDE 10 MEQ/50ML IV SOLN
10.0000 meq | INTRAVENOUS | Status: AC
  Administered 2014-06-12 – 2014-06-13 (×4): 10 meq via INTRAVENOUS
  Filled 2014-06-12 (×4): qty 50

## 2014-06-12 MED ORDER — FUROSEMIDE 10 MG/ML IJ SOLN
80.0000 mg | Freq: Once | INTRAMUSCULAR | Status: AC
  Administered 2014-06-12: 80 mg via INTRAVENOUS

## 2014-06-12 MED ORDER — MIDAZOLAM HCL 2 MG/2ML IJ SOLN
2.0000 mg | Freq: Once | INTRAMUSCULAR | Status: AC
  Administered 2014-06-12: 2 mg via INTRAVENOUS
  Filled 2014-06-12: qty 2

## 2014-06-12 MED ORDER — PANTOPRAZOLE SODIUM 40 MG PO TBEC: 80.0000 mg | DELAYED_RELEASE_TABLET | Freq: Every day | ORAL | Status: DC

## 2014-06-12 MED ORDER — ROCURONIUM BROMIDE 50 MG/5ML IV SOLN
INTRAVENOUS | Status: AC
  Filled 2014-06-12: qty 2

## 2014-06-12 MED ORDER — SODIUM CHLORIDE 0.9 % IJ SOLN
10.0000 mL | Freq: Two times a day (BID) | INTRAMUSCULAR | Status: DC
  Administered 2014-06-12: 30 mL
  Administered 2014-06-12 – 2014-06-13 (×2): 10 mL

## 2014-06-12 MED ORDER — FENTANYL CITRATE 0.05 MG/ML IJ SOLN
INTRAMUSCULAR | Status: AC
  Administered 2014-06-12: 100 ug via INTRAVENOUS
  Filled 2014-06-12: qty 2

## 2014-06-12 MED ORDER — MIDAZOLAM HCL 2 MG/2ML IJ SOLN: 1.0000 mg | INTRAMUSCULAR | Status: DC | PRN

## 2014-06-12 MED ORDER — MORPHINE SULFATE 2 MG/ML IJ SOLN: 2.0000 mg | INTRAMUSCULAR | Status: DC | PRN

## 2014-06-12 MED ORDER — DOCUSATE SODIUM 50 MG/5ML PO LIQD
100.0000 mg | Freq: Two times a day (BID) | ORAL | Status: DC | PRN
  Filled 2014-06-12: qty 10

## 2014-06-12 MED ORDER — PROPOFOL 10 MG/ML IV EMUL
5.0000 ug/kg/min | INTRAVENOUS | Status: DC
  Administered 2014-06-12: 10 ug/kg/min via INTRAVENOUS

## 2014-06-12 MED ORDER — SODIUM CHLORIDE 0.9 % IV SOLN: INTRAVENOUS | Status: DC

## 2014-06-12 MED ORDER — PROPOFOL 10 MG/ML IV EMUL
INTRAVENOUS | Status: AC
  Administered 2014-06-12: 10 ug/kg/min via INTRAVENOUS
  Filled 2014-06-12: qty 100

## 2014-06-12 MED ORDER — SODIUM CHLORIDE 0.9 % IV SOLN
1.0000 g | Freq: Once | INTRAVENOUS | Status: DC
  Filled 2014-06-12: qty 10

## 2014-06-12 MED ORDER — FENTANYL CITRATE 0.05 MG/ML IJ SOLN
INTRAMUSCULAR | Status: AC
  Administered 2014-06-12: 100 ug via INTRAVENOUS
  Filled 2014-06-12: qty 4

## 2014-06-12 MED ORDER — SODIUM CHLORIDE 0.9 % IV SOLN
1.0000 g | Freq: Once | INTRAVENOUS | Status: AC
  Administered 2014-06-12: 1 g via INTRAVENOUS
  Filled 2014-06-12: qty 10

## 2014-06-12 MED ORDER — LIDOCAINE HCL (CARDIAC) 20 MG/ML IV SOLN
INTRAVENOUS | Status: AC
  Administered 2014-06-12: 100 mg
  Filled 2014-06-12: qty 5

## 2014-06-12 MED ORDER — ASPIRIN 81 MG PO CHEW: 81.0000 mg | CHEWABLE_TABLET | Freq: Every day | ORAL | Status: DC

## 2014-06-12 MED ORDER — MAGNESIUM SULFATE 2 GM/50ML IV SOLN
INTRAVENOUS | Status: AC
Start: 2014-06-12 — End: 2014-06-13
  Filled 2014-06-12: qty 50

## 2014-06-12 MED ORDER — SODIUM CHLORIDE 0.9 % IJ SOLN
10.0000 mL | Freq: Two times a day (BID) | INTRAMUSCULAR | Status: DC
  Administered 2014-06-12 – 2014-06-13 (×2): 10 mL

## 2014-06-12 MED ORDER — NOREPINEPHRINE BITARTRATE 1 MG/ML IV SOLN
0.0000 ug/min | INTRAVENOUS | Status: DC
  Administered 2014-06-12: 20 ug/min via INTRAVENOUS
  Filled 2014-06-12: qty 4

## 2014-06-12 MED ORDER — ONDANSETRON HCL 4 MG/2ML IJ SOLN: 4.0000 mg | Freq: Four times a day (QID) | INTRAMUSCULAR | Status: DC | PRN

## 2014-06-12 MED FILL — Medication: Qty: 1 | Status: AC

## 2014-06-12 NOTE — Progress Notes (Signed)
  Upon returning from cath lab patient developed sustained ventricular tachycardia in the 140-160 range. Became progressively unstable with worsening respiratory status and hypotension.  He was seen by both myself and Dr. Rexene Agent. Treated with IV amiodarone and lidocaine with no response.   We then sedated him and cardioverted him emergently with 150J synchronized shock. He was intubated by Dr. Titus Mould.  He then developed progressive hypotension and was started on levophed.    I placed a RIJ central line for access and hemodynamic monitoring.  Total CCT 45 minutes not including procedure time.   Kaisei Garner Dullea,MD 4:12 PM

## 2014-06-12 NOTE — Progress Notes (Signed)
eLink Physician-Brief Progress Note Patient Name: ZADEN SAKO DOB: Jan 21, 1946 MRN: [id number]   Date of Service  06/12/2014  HPI/Events of Note  K 2.9  Ca 5.7  eICU Interventions  IV KCL and Ca gluconate ordered     Intervention Category Major Interventions: Electrolyte abnormality - evaluation and management  Asencion Noble 06/12/2014, 7:58 PM

## 2014-06-12 NOTE — Progress Notes (Signed)
Patient vomited approximately 50 ml of gross blood around ETT and OGT. Dr. Joya Gaskins notified and Dr. Jenkins Rouge notified. Order obtained to stop Aggrastat. Heparin level sent. Will continue to monitor.

## 2014-06-12 NOTE — CV Procedure (Signed)
Scott [name] is a 68 y.o. male    [id number] LOCATION:  FACILITY: [facility]  PHYSICIAN: Quay Burow, M.D. Oct 11, 1945   DATE OF PROCEDURE:  06/12/2014  DATE OF DISCHARGE:     CARDIAC CATHETERIZATION     History obtained from chart review. Mr. Rawlins is a 68 year old moderately overweight married Caucasian male who lives in [place]. He presents now with an anterior STEMI for emergency cardiac catheterization. He has a history of prior stenting by Dr. Wilburn Cornelia several years ago to his circumflex coronary artery. He has a history of hypertension, hyperlipidemia and diabetes. He does not smoke. He developed chest pain beginning at 7:30 this morning and went to [facility] where he was diagnosed as having an anterolateral myocardial infarction. He with IV heparin and aspirin and transported to [place]:   The patient was brought to the second floor [facility] cath lab in the postabsorptive state. He was not premedicated . His right groinwas prepped and shaved in usual sterile fashion. Xylocaine 1% was used for local anesthesia. A 6 French sheath was inserted into the right common femoral artery and right common femoral vein using standard Seldinger technique.5 French right and left Judkins diagnostic catheters along with a 5 French pigtail catheter were used for selective coronary angiography and obtaining left heart pressures. The patient was used for the entirety of the case. Retrograde aortic, left ventriculogram and pullback pressures were recorded.   HEMODYNAMICS:    AO SYSTOLIC/AO DIASTOLIC: 696/78   LV SYSTOLIC/LV DIASTOLIC: 938/10  ANGIOGRAPHIC RESULTS:   1. Left main; normal  2. LAD; occluded after the first diagonal branch. 3. Left circumflex; nondominant with a patent stent in the mid AV groove circumflex. There was 50% stenosis in the proximal obtuse marginal branch as well as in the AV groove  circumflex beyond obtuse marginal branch takeoff..  4. Right coronary artery; dominant and free of significant disease 5. Left ventriculography; not performed  IMPRESSION:Mr. Harriger has an occluded proximal LAD with an anterolateral STEMI and cardiogenic shock. He has acute pulmonary edema as well. We will proceed with PCI and stenting using drug-eluting stent, Angiomax and antiplatelet therapy. The patient received 600 mg of Plavix by mouth at [facility]    Procedure description: The patient received Angiomax bolus with an ACT of 372. Total contrast administered the patient was 200 mL. The patient did receive amiodarone bolus and infusion because of brief runs of wide-complex tachycardia. In addition, he received Aggrastat bolus and infusion because of visible thrombus within the stented segment he was initially placed on IV dopamine because of hypotension which resulted in tachycardia and this was later changed to Levophed  with improvement in his blood pressure.   Using a 6 [place] XB LAD 3.5 cm guide catheter along with an 014/190 mm long pro-water guidewire and a 2 mm x 12 mm long balloon predilatation was performed. Antegrade flow was reestablished with a door to balloon time of 24 minutes. The balloon was then upgraded to a 2.5 mm x 15 mm long balloon. There was initially slow flow down the LAD. I then placed a 3.5 mm x 18 mm long Xience alpine drug-eluting stent deployed at 16 atm. I attempted to perform IVUS however the machine had technical difficulties. I then postdilated with a 3.75 mm x 15 mm long noncompliant balloon at 16 atm (3.9 mm) resulting in reduction of a total occlusion to 0% residual with TIMI-3 flow in both  the LAD and diagonal branch. The diagonal branch was a 2 mm vessel and to have a high-grade ostial stenosis.  Because of the patient's hemodynamic instability with severe pulmonary edema, and acidosis I decided to place an intra-aortic balloon pump 1:2. This  resulted in an augmented pressure in the 150 mmHg range. I then tied down titrated to levophed. The patient was acidotic on a nonrebreathing mask and he was switched to BiPAP which resulted in improvement in his oxygenation. He received 80 mg of Lasix IV. A Foley catheter was placed in the Cath Lab. He also received IV morphine  Final impression: Acute anterolateral STEMI secondary to occlusion of the proximal LAD competent by cardiogenic shock, ventricular tachyarrhythmias and acute probably edema. He had successful reperfusion and stenting using a drug-eluting stent, Angiomax and Platelet therapy. His door to balloon time was 24 minutes. He left the lab in stable condition, mentating and conversant with an intra-aortic balloon pump, BiPAP on low-dose levo fed, amiodarone, Angiomax, and Aggrastat. We will get a 2-D echocardiogram for LV function. His pressors and balloon pump will be weaned as tolerated. Ultimately he will need to be treated with beta blocker, ACE inhibitor, high-dose statin drugs. We may need to transition him from Plavix to [place].  Lorretta Harp MD, [facility] 06/12/2014 11:39 AM

## 2014-06-12 NOTE — Progress Notes (Signed)
CRITICAL VALUE ALERT  Critical value received:  K. 2.9, Ca 5.7  Date of notification:  06/12/14  Time of notification:  1950  Critical value read back:Yes.    Nurse who received alert:  Renee Rival   MD notified (1st page):  Dr. Joya Gaskins  Time of first page:  1953  MD notified (2nd page):  Time of second page:  Responding MD:  Dr. Joya Gaskins  Time MD responded:  [phone]

## 2014-06-12 NOTE — H&P (Signed)
Chief Complaint: Chest Pain; Code STEMI  HPI: The patient is a 68 y/o married male with known history of CAD admitted to [facility] as a CODE STEMI. PMH is also significant for HTN, HLD and diabetes. He is a non smoker. Cardiac history is significant for prior MI s/p coronary stenting 3 years ago by Dr. Clayborn Bigness. He also has history of carotid artery stenting.   He was in his usual state of health until developing severe substernal chest pain around 7:30 am today. Pain was not relieved with SL NTG. Patient was initially seen at [facility]. There, EKG demonstrated lateral ST elevations. He was transported urgently to [facility]. En route, he developed bradycardia with HR as low as 28 bpm. He was given atropine x 1 and HR improved. He was also hypotensive and received 500 cc of saline. BP was stabilized.  On arrival to [facility], he had 8/10 ongoing CP.    No past medical history on file.  No past surgical history on file.  Family History  Problem Relation Age of Onset  . Hypertension Mother    Social History:  has no tobacco, alcohol, and drug history on file.  Allergies: No Known Allergies  Medications Prior to Admission  Medication Sig Dispense Refill  . amLODipine (NORVASC) 10 MG tablet Take 10 mg by mouth daily.    Marland Kitchen aspirin EC 81 MG tablet Take 81 mg by mouth daily.    Marland Kitchen esomeprazole (NEXIUM) 40 MG capsule Take 40 mg by mouth every Monday, Wednesday, and Friday.    . hydrALAZINE (APRESOLINE) 25 MG tablet Take 25 mg by mouth 3 (three) times daily.    . indomethacin (INDOCIN SR) 75 MG CR capsule Take 75 mg by mouth 2 (two) times daily with a meal.    . isosorbide mononitrate (IMDUR) 30 MG 24 hr tablet Take 30 mg by mouth daily.    Marland Kitchen losartan (COZAAR) 100 MG tablet Take 100 mg by mouth daily.    . metaxalone (SKELAXIN) 800 MG tablet Take 800 mg by mouth daily.    . Multiple Vitamins-Minerals (MULTIVITAMIN WITH MINERALS) tablet Take 1 tablet by mouth daily.    . rosuvastatin (CRESTOR) 10 MG tablet Take 10  mg by mouth daily.    . meclizine (ANTIVERT) 25 MG tablet Take 25 mg by mouth 3 (three) times daily as needed for dizziness.      No results found for this or any previous visit (from the past 48 hour(s)). No results found.  Review of Systems  Respiratory: Negative for shortness of breath.   Cardiovascular: Positive for chest pain.  All other systems reviewed and are negative.   Height 5\' 8"  (1.727 m), weight 213 lb 13.5 oz (97 kg). Physical Exam  Constitutional: He is oriented to person, place, and time. He appears well-developed and well-nourished. No distress.  Cardiovascular: Normal rate and regular rhythm.  Exam reveals no gallop and no friction rub.   No murmur heard. Respiratory: Effort normal and breath sounds normal. No respiratory distress. He has no wheezes. He has no rales.  Musculoskeletal: He exhibits no edema.  Neurological: He is alert and oriented to person, place, and time.  Skin: Skin is warm and dry.  Psychiatric: He has a normal mood and affect. His behavior is normal.     Assessment/Plan Active Problems:   STEMI (ST elevation myocardial infarction)   Cardiogenic shock  1. Lateral Wall STEMI/Cardiogentic Shock:  Emergent LHC with LAD stenting and insertion of IABP. On  Levophed. Will obtain STAT 2D echo.   2. Acute Pulmonary Edema: IV Lasix given. Respiratory therapy consulted for STAT BiPAP.  SIMMONS, [place] 06/12/2014, 11:16 AM   Agree with note written by Ellen Henri  PAC  Anterolateral wall MI with CGS/APE for acute cath +/- intervention.    Lorretta Harp 06/12/2014 4:23 PM

## 2014-06-12 NOTE — Progress Notes (Addendum)
eLink Physician-Brief Progress Note Patient Name: Scott [name] DOB: 1946-05-28 MRN: [id number]   Date of Service  06/12/2014  HPI/Events of Note  ongong bleeding from upper gi source  eICU Interventions  D/c asa and aggrastat    Cont hep drip Call cards, prob needs gi eval, start PPI drip     Intervention Category Intermediate Interventions: Bleeding - evaluation and treatment with blood products  Asencion Noble 06/12/2014, 9:07 PM

## 2014-06-12 NOTE — Consult Note (Signed)
PULMONARY / CRITICAL CARE MEDICINE   Name: Scott [name] MRN: [id number] DOB: Jan 27, 1946    ADMISSION DATE:  06/12/2014 CONSULTATION DATE:  12/12  REFERRING MD :  Gwenlyn Found   CHIEF COMPLAINT:  Respiratory failure   INITIAL PRESENTATION: 68yo male with hx CAD admitted 12/12 with chest pain as code STEMI from [facility].  Hypotensive, bradycardic with lateral ST elevation on EKG.  He was taken urgently to cath lab and found to have occluded LAD and DES placed, IABP placed. Had marginal respiratory status throughout requiring bipap and PCCM called.  On arrival to ICU had recurrent, symptomatic VT and worsening respiratory status requiring intubation.    STUDIES:  2D echo 12/12>>>  SIGNIFICANT EVENTS: 12/12 cath>> occluded proximal LAD, DES, angiomax 12/12 recurrent VT, shocked, intubated   HISTORY OF PRESENT ILLNESS:   68yo male with hx CAD admitted 12/12 with chest pain as code STEMI from [place].  Hypotensive, bradycardic with lateral ST elevation on EKG.  He was taken urgently to cath lab and found to have occluded LAD and DES placed. Had marginal respiratory status throughout requiring bipap and PCCM called.  On arrival to ICU had recurrent, symptomatic VT and worsening respiratory status requiring intubation.  VT was shocked with return of ST.  Remains hypotensive.    PAST MEDICAL HISTORY :   has a past medical history of Hyperlipidemia; Hypertension; and Diabetes.  has no past surgical history on file. Prior to Admission medications   Medication Sig Start Date End Date Taking? Authorizing Provider  amLODipine (NORVASC) 10 MG tablet Take 10 mg by mouth daily.   Yes Historical Provider, MD  aspirin EC 81 MG tablet Take 81 mg by mouth daily.   Yes Historical Provider, MD  esomeprazole (NEXIUM) 40 MG capsule Take 40 mg by mouth every Monday, Wednesday, and Friday.   Yes Historical Provider, MD  hydrALAZINE (APRESOLINE) 25 MG tablet Take 25 mg by mouth 3 (three) times daily.   Yes  Historical Provider, MD  indomethacin (INDOCIN SR) 75 MG CR capsule Take 75 mg by mouth 2 (two) times daily with a meal.   Yes Historical Provider, MD  isosorbide mononitrate (IMDUR) 30 MG 24 hr tablet Take 30 mg by mouth daily.   Yes Historical Provider, MD  losartan (COZAAR) 100 MG tablet Take 100 mg by mouth daily.   Yes Historical Provider, MD  metaxalone (SKELAXIN) 800 MG tablet Take 800 mg by mouth daily.   Yes Historical Provider, MD  Multiple Vitamins-Minerals (MULTIVITAMIN WITH MINERALS) tablet Take 1 tablet by mouth daily.   Yes Historical Provider, MD  rosuvastatin (CRESTOR) 10 MG tablet Take 10 mg by mouth daily.   Yes Historical Provider, MD  meclizine (ANTIVERT) 25 MG tablet Take 25 mg by mouth 3 (three) times daily as needed for dizziness.    Historical Provider, MD   No Known Allergies  FAMILY HISTORY:  has no family status information on file.  SOCIAL HISTORY:    REVIEW OF SYSTEMS:  Unable.    SUBJECTIVE: coding VT  VITAL SIGNS: Pulse Rate:  [50-98] 50 (12/12 1300) Resp:  [24] 24 (12/12 1300) BP: (70)/(41) 70/41 mmHg (12/12 1300) SpO2:  [93 %] 93 % (12/12 1300) FiO2 (%):  [100 %] 100 % (12/12 1300) Weight:  [213 lb 13.5 oz (97 kg)] 213 lb 13.5 oz (97 kg) (12/12 1100) HEMODYNAMICS:   VENTILATOR SETTINGS: Vent Mode:  [-] PRVC FiO2 (%):  [100 %] 100 % Set Rate:  [24 bmp] 24 bmp Vt Set:  [  600 mL] 600 mL PEEP:  [5 cmH20] 5 cmH20 Plateau Pressure:  [32 cmH20] 32 cmH20 INTAKE / OUTPUT: No intake or output data in the 24 hours ending 06/12/14 1308  PHYSICAL EXAMINATION: General:  Chronically ill appearing male, critically ill  Neuro:  Awake but somnolent on bipap, now sedated on vent post intubation and shock  HEENT:  Mm dry, ETT  Cardiovascular:  s1s2 irreg, now out of VT, ST  Lungs:  resps even non labored on vent, rales, pink frothy sputum  Abdomen:  Round, distended  Musculoskeletal:  Pale, warm and dry, no sig edema, R groin IABP   LABS:  CBC  Recent  Labs Lab 06/12/14 1243  WBC 19.6*  HGB 13.6  HCT 40.4  PLT 422*   Coag's No results for input(s): APTT, INR in the last 168 hours. BMET No results for input(s): NA, K, CL, CO2, BUN, CREATININE, GLUCOSE in the last 168 hours. Electrolytes No results for input(s): CALCIUM, MG, PHOS in the last 168 hours. Sepsis Markers No results for input(s): LATICACIDVEN, PROCALCITON, O2SATVEN in the last 168 hours. ABG No results for input(s): PHART, PCO2ART, PO2ART in the last 168 hours. Liver Enzymes No results for input(s): AST, ALT, ALKPHOS, BILITOT, ALBUMIN in the last 168 hours. Cardiac Enzymes No results for input(s): TROPONINI, PROBNP in the last 168 hours. Glucose No results for input(s): GLUCAP in the last 168 hours.  Imaging No results found.   ASSESSMENT / PLAN:  PULMONARY OETT 12/12>>> Acute respiratory failure - r/t pulm edema in setting STEMI, VT Mixed respiratory/ metabolic acidosis  P:   Vent support 8 cc/kg, rate 24, 100% , peep increase to 10 F/u ABG in 30 min after emergent ETT  PRN BD  Daily SBT unable until hemodynamics , arrhythmia and ph corrected (MV needs) F/u CXR  Lasix to neg balance  CARDIOVASCULAR CVL 12/12 (DB)>>> STEMI - occluded LAD, DES placed 12/12 CAD  VT  Hypotension - cardiogenic shock  P:  S/p cath with stent placement  Cont anticoagulation per cards  Amiodarone  F/u chem  Echo pending  Levophed gtt for MAP >65 Lidocaine gtt per cards Get cvp   RENAL Metabolic acidosis  P:   Stat chem  rx hyperkalemia suspected in setting acidosis (treated in code) Replace electrolytes  Lactic acid If K up still will need bicarb drip  GASTROINTESTINAL No active issue  P:   OG tube to suction  Consider TF if no extubation in am  PPI  Get LFt for risk shock liver  HEMATOLOGIC STEMI P:  Monitor cbc on anticoagulation  Cbc now and in am   INFECTIOUS leukocytosis P:   Monitor wbc, fever curve off abx   ENDOCRINE DM , r/o Rel  AI P:   SSI  Get cortisol  NEUROLOGIC AMS - r/t hypotension in setting STEMI, shock  P:   RASS goal: -1 Fentanyl gtt  No WUA today  FAMILY  - Updates: no family available 12/12 - updated by Dr. Sherrilee Gilles, NP 06/12/2014  1:08 PM Pager: [phone] or [phone]   STAFF NOTE: Linwood Dibbles, MD FACP have personally reviewed patient's available data, including medical history, events of note, physical examination and test results as part of my evaluation. I have discussed with resident/NP and other care providers such as pharmacist, RN and RRT. In addition, I personally evaluated patient and elicited key findings of: distress, moves all ext equally, coarse BS, needs emergent intubation, treating empiric k  in Vt now, keep up high MV, lasix, levo, cvp , line needed, stat pcxr, abg to follow The patient is critically ill with multiple organ systems failure and requires high complexity decision making for assessment and support, frequent evaluation and titration of therapies, application of advanced monitoring technologies and extensive interpretation of multiple databases.   Critical Care Time devoted to patient care services described in this note is85 Minutes. This time reflects time of care of this signee: Merrie Roof, MD FACP. This critical care time does not reflect procedure time, or teaching time or supervisory time of PA/NP/Med student/Med Resident etc but could involve care discussion time. Rest per NP/medical resident whose note is outlined above and that I agree with   Lavon Paganini. Titus Mould, MD, [place] Pgr: [facility] 06/12/2014 1:08 PM

## 2014-06-12 NOTE — Procedures (Signed)
Intubation Procedure Note Scott [name] [id number] 1945/09/26  Procedure: Intubation Indications: Respiratory insufficiency, Vt arresting  Procedure Details Consent: Unable to obtain consent because of emergent medical necessity. Time Out: Verified patient identification, verified procedure, site/side was marked, verified correct patient position, special equipment/implants available, medications/allergies/relevent history reviewed, required imaging and test results available.  Performed  Maximum sterile technique was used including gloves, gown, hand hygiene and mask.  MAC and 4 Glide as severe ankylosing spond stiff neck    Evaluation Hemodynamic Status: BP stable throughout; O2 sats: stable throughout Patient's Current Condition: unstable Complications: No apparent complications Patient did tolerate procedure well. Chest X-ray ordered to verify placement.  CXR: pending.   Scott [name]. 06/12/2014   NOTE stiff neck, no view with any mach blade Glide wroked well

## 2014-06-12 NOTE — Procedures (Signed)
Electrical Cardioversion Procedure Note Scott [name] [id number] 1945-08-05  Procedure: Electrical Cardioversion Indications:  Ventricular Tachycardia  Procedure Details Consent: Unable to obtain consent because of emergent medical necessity. Time Out: Verified patient identification, verified procedure, site/side was marked, verified correct patient position, special equipment/implants available, medications/allergies/relevent history reviewed, required imaging and test results available.  Performed  Patient placed on cardiac monitor, pulse oximetry, supplemental oxygen as necessary.  Sedation given: Etomidate Pacer pads placed anterior and posterior chest.  Cardioverted 1 time(s).  Cardioverted at 150J.  Evaluation Findings: Post procedure EKG shows: NSR Complications: None Patient did tolerate procedure well.   Scott Bickers MD 06/12/2014, 4:13 PM

## 2014-06-12 NOTE — Progress Notes (Signed)
Upon arriving from cath lab, pt started to deteriorate. MD at bedside. Please see MD notes.

## 2014-06-12 NOTE — Progress Notes (Signed)
Orthopedic Tech Progress Note Patient Details:  Scott [name] 1945-11-25 [id number]  Ortho Devices Type of Ortho Device: Knee Immobilizer Ortho Device/Splint Location: rle Ortho Device/Splint Interventions: Application   Hildred Priest 06/12/2014, 12:31 PM

## 2014-06-12 NOTE — Progress Notes (Addendum)
ANTICOAGULATION CONSULT NOTE - Initial Consult  Pharmacy Consult for Tirofiban > heparin  Indication: chest pain/ACS / IABP  No Known Allergies  Patient Measurements: Height: 5\' 8"  (172.7 cm) Weight: 213 lb 13.5 oz (97 kg) IBW/kg (Calculated) : 68.4 Heparin dosing Wt 90kg  Vital Signs: BP: 70/41 mmHg (12/12 1300) Pulse Rate: 50 (12/12 1300)  Labs:  Recent Labs  06/12/14 1243  HGB 13.6  HCT 40.4  PLT 422*  CREATININE 1.01  TROPONINI >20.00*    Estimated Creatinine Clearance: 81.2 mL/min (by C-G formula based on Cr of 1.01).   Medical History: Past Medical History  Diagnosis Date  . Hyperlipidemia   . Hypertension   . Diabetes       Assessment: CC:  known history of CAD - with ongoing CP x1 week worse today >admitted to [facility]> transferred to [facility] as a CODE STEMI Received clop 600mg  x1 at [place]  PMH: MI s/p coronary stenting 3 years ago, DM, HLD  AC: None pta, in lab angiomax - run 4hr post case 0.25 mg/kg/hr end at 1600 > when finished begin heparin drip for IABP 2:1, heparin drip 800 uts/hr large clot burden in stent > started aggrastat at end of case run x18hr at 0.15 mcg/kg/min ( after 42mcg/kg bolus)  cbc stable currently  ID:  Slight bump wbc 19 - acute phase reactant, afebrile, no abx - when finished begin heparin drip for IABP 2:1 CV: Hx CAD with 3 stents - STEMI 12/12 occluded LAD > PCI with DES-angiomax 0.25mg /kg/hr x4hr post cath>  when finished begin heparin drip for IABP 2:1 tirofiban 0.15mcg/kg/min - for large in stent clot burden Runs VT in lab but came out on own - started amio bolus 150 x1 then drip On floor NSVT > shocked out > intubated sedated  Continue amio drip, NE drip, asa81, statin, clop - may change to ticagrelor next week after tirofiban off  Endo:   GI/Nutr:  ppi iv  Neuro: sedated on vent  Renal: Cr stable at [place] 1.38 K 3.8 at [place], [place] unk replaced during VT  Pulm: intubated post STEMI - d/t acute pulm edema during  cath   Other/PTA meds  Best practices: ppi, hep to start,     Goal of Therapy:  Heparin level 0.2-0.5 units/ml - when heparin started Monitor platelets by anticoagulation protocol: Yes   Plan:  angiomax 0.25 mg/kg/hr end 1600 aggrastat 0.36mcg/kg/min x18hr Begin heparin drip 800 uts/hr at 1600 HL at 2200 [place] HL, CBC daily  Bonnita Nasuti Pharm.D. CPP, BCPS Clinical Pharmacist [phone] 06/12/2014 1:33 PM

## 2014-06-12 NOTE — Progress Notes (Signed)
eLink Physician-Brief Progress Note Patient Name: Scott [name] DOB: 02-08-1946 MRN: [id number]   Date of Service  06/12/2014  HPI/Events of Note  Agitation and shock state  eICU Interventions  Add vasopressin, chk cvp, change to PAD protocol d/c propofol, use fentanyl drip and prn versed      Intervention Category Major Interventions: Shock - evaluation and management;Delirium, psychosis, severe agitation - evaluation and management  Asencion Noble 06/12/2014, 3:23 PM

## 2014-06-12 NOTE — Procedures (Signed)
Central Venous Catheter Insertion Procedure Note Scott [name] [id number] 1945/10/25  Procedure: Insertion of Central Venous Catheter Indications: Drug and/or fluid administration  Procedure Details Consent: Risks of procedure as well as the alternatives and risks of each were explained to the (patient/caregiver).  Consent for procedure obtained. and Unable to obtain consent because of emergent medical necessity. Time Out: Verified patient identification, verified procedure, site/side was marked, verified correct patient position, special equipment/implants available, medications/allergies/relevent history reviewed, required imaging and test results available.  Performed  Maximum sterile technique was used including antiseptics, cap, gloves, gown, hand hygiene, mask and sheet. Skin prep: Chlorhexidine; local anesthetic administered A antimicrobial bonded/coated triple lumen catheter was placed in the right internal jugular vein using the Seldinger technique.  Evaluation Blood flow good Complications: No apparent complications Patient did tolerate procedure well. Chest X-ray ordered to verify placement.  CXR: normal.  Scott Bickers MD 06/12/2014, 4:12 PM

## 2014-06-12 NOTE — Progress Notes (Signed)
Echo Lab  2D Echocardiogram completed.  [place], RDCS 06/12/2014 3:21 PM

## 2014-06-13 ENCOUNTER — Inpatient Hospital Stay (HOSPITAL_COMMUNITY): Payer: Medicare Other

## 2014-06-13 DIAGNOSIS — R100 Acute abdomen: Secondary | ICD-10-CM

## 2014-06-13 DIAGNOSIS — R109 Unspecified abdominal pain: Secondary | ICD-10-CM | POA: Insufficient documentation

## 2014-06-13 DIAGNOSIS — J9601 Acute respiratory failure with hypoxia: Secondary | ICD-10-CM

## 2014-06-13 LAB — POCT I-STAT 3, ART BLOOD GAS (G3+)
ACID-BASE DEFICIT: 14 mmol/L — AB (ref 0.0–2.0)
ACID-BASE DEFICIT: 11 mmol/L — AB (ref 0.0–2.0)
Acid-base deficit: 3 mmol/L — ABNORMAL HIGH (ref 0.0–2.0)
BICARBONATE: 17 meq/L — AB (ref 20.0–24.0)
BICARBONATE: 14.9 meq/L — AB (ref 20.0–24.0)
BICARBONATE: 20.5 meq/L (ref 20.0–24.0)
O2 SAT: 89 %
O2 Saturation: 94 %
O2 Saturation: 100 %
PO2 ART: 81 mmHg (ref 80.0–100.0)
PO2 ART: 79 mmHg — AB (ref 80.0–100.0)
PO2 ART: 220 mmHg — AB (ref 80.0–100.0)
Patient temperature: 98.7
Patient temperature: 98.6
TCO2: 19 mmol/L (ref 0–100)
TCO2: 16 mmol/L (ref 0–100)
TCO2: 21 mmol/L (ref 0–100)
pCO2 arterial: 61.6 mmHg (ref 35.0–45.0)
pCO2 arterial: 32.4 mmHg — ABNORMAL LOW (ref 35.0–45.0)
pCO2 arterial: 30.2 mmHg — ABNORMAL LOW (ref 35.0–45.0)
pH, Arterial: 7.048 — CL (ref 7.350–7.450)
pH, Arterial: 7.271 — ABNORMAL LOW (ref 7.350–7.450)
pH, Arterial: 7.441 (ref 7.350–7.450)

## 2014-06-13 LAB — COMPREHENSIVE METABOLIC PANEL
ALBUMIN: 3.3 g/dL — AB (ref 3.5–5.2)
ALK PHOS: 110 U/L (ref 39–117)
ALT: 203 U/L — AB (ref 0–53)
AST: 959 U/L — ABNORMAL HIGH (ref 0–37)
Anion gap: 15 (ref 5–15)
BILIRUBIN TOTAL: 0.5 mg/dL (ref 0.3–1.2)
BUN: 21 mg/dL (ref 6–23)
CHLORIDE: 104 meq/L (ref 96–112)
CO2: 21 mEq/L (ref 19–32)
Calcium: 8.8 mg/dL (ref 8.4–10.5)
Creatinine, Ser: 0.99 mg/dL (ref 0.50–1.35)
GFR calc Af Amer: >90 mL/min (ref >90)
GFR calc non Af Amer: 83 mL/min — ABNORMAL LOW (ref >90)
GLUCOSE: 204 mg/dL — AB (ref 70–99)
POTASSIUM: 4.8 meq/L (ref 3.7–5.3)
SODIUM: 140 meq/L (ref 137–147)
Total Protein: 6.6 g/dL (ref 6.0–8.3)

## 2014-06-13 LAB — CBC WITH DIFFERENTIAL/PLATELET
BASOS PCT: 0 % (ref 0–1)
Basophils Absolute: 0 10*3/uL (ref 0.0–0.1)
EOS ABS: 0 10*3/uL (ref 0.0–0.7)
EOS PCT: 0 % (ref 0–5)
HEMATOCRIT: 37.6 % — AB (ref 39.0–52.0)
Hemoglobin: 12.7 g/dL — ABNORMAL LOW (ref 13.0–17.0)
Lymphocytes Relative: 8 % — ABNORMAL LOW (ref 12–46)
Lymphs Abs: 1.6 10*3/uL (ref 0.7–4.0)
MCH: 31.4 pg (ref 26.0–34.0)
MCHC: 33.8 g/dL (ref 30.0–36.0)
MCV: 93.1 fL (ref 78.0–100.0)
MONO ABS: 2 10*3/uL — AB (ref 0.1–1.0)
MONOS PCT: 10 % (ref 3–12)
Neutro Abs: 16.1 10*3/uL — ABNORMAL HIGH (ref 1.7–7.7)
Neutrophils Relative %: 82 % — ABNORMAL HIGH (ref 43–77)
Platelets: 411 10*3/uL — ABNORMAL HIGH (ref 150–400)
RBC: 4.04 MIL/uL — ABNORMAL LOW (ref 4.22–5.81)
RDW: 13.6 % (ref 11.5–15.5)
WBC: 19.8 10*3/uL — ABNORMAL HIGH (ref 4.0–10.5)

## 2014-06-13 LAB — HEPARIN LEVEL (UNFRACTIONATED)
HEPARIN UNFRACTIONATED: 0.23 [IU]/mL — AB (ref 0.30–0.70)
Heparin Unfractionated: 0.15 IU/mL — ABNORMAL LOW (ref 0.30–0.70)

## 2014-06-13 LAB — CBC
HCT: 34.2 % — ABNORMAL LOW (ref 39.0–52.0)
Hemoglobin: 11.4 g/dL — ABNORMAL LOW (ref 13.0–17.0)
MCH: 31.1 pg (ref 26.0–34.0)
MCHC: 33.3 g/dL (ref 30.0–36.0)
MCV: 93.4 fL (ref 78.0–100.0)
PLATELETS: 343 10*3/uL (ref 150–400)
RBC: 3.66 MIL/uL — AB (ref 4.22–5.81)
RDW: 13.8 % (ref 11.5–15.5)
WBC: 18 10*3/uL — ABNORMAL HIGH (ref 4.0–10.5)

## 2014-06-13 LAB — TSH: TSH: 3.55 u[IU]/mL (ref 0.350–4.500)

## 2014-06-13 LAB — TROPONIN I

## 2014-06-13 LAB — PROTIME-INR
INR: 1.13 (ref 0.00–1.49)
Prothrombin Time: 14.7 seconds (ref 11.6–15.2)

## 2014-06-13 LAB — CARBOXYHEMOGLOBIN
CARBOXYHEMOGLOBIN: 1 % (ref 0.5–1.5)
Methemoglobin: 1.1 % (ref 0.0–1.5)
O2 Saturation: 68.2 %
Total hemoglobin: 11.5 g/dL — ABNORMAL LOW (ref 13.5–18.0)

## 2014-06-13 LAB — GLUCOSE, CAPILLARY
Glucose-Capillary: 197 mg/dL — ABNORMAL HIGH (ref 70–99)
Glucose-Capillary: 232 mg/dL — ABNORMAL HIGH (ref 70–99)
Glucose-Capillary: 162 mg/dL — ABNORMAL HIGH (ref 70–99)
Glucose-Capillary: 122 mg/dL — ABNORMAL HIGH (ref 70–99)
Glucose-Capillary: 193 mg/dL — ABNORMAL HIGH (ref 70–99)
Glucose-Capillary: 141 mg/dL — ABNORMAL HIGH (ref 70–99)

## 2014-06-13 LAB — APTT: aPTT: 55 seconds — ABNORMAL HIGH (ref 24–37)

## 2014-06-13 MED ORDER — FUROSEMIDE 10 MG/ML IJ SOLN
40.0000 mg | Freq: Once | INTRAMUSCULAR | Status: AC
  Administered 2014-06-13: 40 mg via INTRAVENOUS

## 2014-06-13 MED ORDER — DIGOXIN 125 MCG PO TABS
0.1250 mg | ORAL_TABLET | Freq: Every day | ORAL | Status: DC
  Administered 2014-06-13 – 2014-06-17 (×5): 0.125 mg via ORAL
  Filled 2014-06-13 (×5): qty 1

## 2014-06-13 MED ORDER — HYDROCORTISONE NA SUCCINATE PF 100 MG IJ SOLR
50.0000 mg | Freq: Four times a day (QID) | INTRAMUSCULAR | Status: DC
  Administered 2014-06-13 – 2014-06-14 (×4): 50 mg via INTRAVENOUS
  Filled 2014-06-13 (×8): qty 1

## 2014-06-13 NOTE — Progress Notes (Signed)
PULMONARY / CRITICAL CARE MEDICINE   Name: Scott [name] MRN: [id number] DOB: 06/17/1946    ADMISSION DATE:  06/12/2014 CONSULTATION DATE:  12/12  REFERRING MD :  Gwenlyn Found   CHIEF COMPLAINT:  Respiratory failure   INITIAL PRESENTATION: 68yo male with hx CAD admitted 12/12 with chest pain as code STEMI from [place].  Hypotensive, bradycardic with lateral ST elevation on EKG.  He was taken urgently to cath lab and found to have occluded LAD and DES placed, IABP placed. Had marginal respiratory status throughout requiring bipap and PCCM called.  On arrival to ICU had recurrent, symptomatic VT and worsening respiratory status requiring intubation.    STUDIES:  2D echo 12/12>>>20%. The cavity size was severely dilated. Wall thickness was increased in a patternof mild LVH. - Right ventricle: The cavity size was mildly dilated. Systolic function was mildly to moderately reduced.  SIGNIFICANT EVENTS: 12/12 cath>> occluded proximal LAD, DES, angiomax 12/12 recurrent VT, shocked, intubated   SUBJECTIVE: concerns GI bleeding, pressors required  VITAL SIGNS: Temp:  [97.6 F (36.4 C)-99.3 F (37.4 C)] 99.3 F (37.4 C) (12/13 0400) Pulse Rate:  [26-98] 59 (12/13 0700) Resp:  [15-28] 23 (12/13 0700) BP: (38-126)/(17-88) 99/50 mmHg (12/13 0700) SpO2:  [83 %-100 %] 100 % (12/13 0700) FiO2 (%):  [60 %-100 %] 60 % (12/13 0741) Weight:  [97 kg (213 lb 13.5 oz)-103 kg (227 lb 1.2 oz)] 103 kg (227 lb 1.2 oz) (12/12 1600) HEMODYNAMICS: CVP:  [8 mmHg-10 mmHg] 8 mmHg VENTILATOR SETTINGS: Vent Mode:  [-] PRVC FiO2 (%):  [60 %-100 %] 60 % Set Rate:  [24 bmp] 24 bmp Vt Set:  [600 mL] 600 mL PEEP:  [5 cmH20-10 cmH20] 10 cmH20 Plateau Pressure:  [28 cmH20-32 cmH20] 29 cmH20 INTAKE / OUTPUT:  Intake/Output Summary (Last 24 hours) at 06/13/14 0811 Last data filed at 06/13/14 0700  Gross per 24 hour  Intake 3044.16 ml  Output   3690 ml  Net -645.84 ml    PHYSICAL EXAMINATION: General:   Chronically ill appearing male, critically ill  Neuro:  rass 0, alert, follows commands HEENT:  jvd present ETT  Cardiovascular:  s1s2 RRR Lungs:  coarse Abdomen:  Round, distended , no r/g Musculoskeletal:  No edema   LABS:  CBC  Recent Labs Lab 06/12/14 1841 06/12/14 2131 06/13/14 0428  WBC 17.4* 22.3* 19.8*  HGB 10.4* 12.7* 12.7*  HCT 31.1* 37.8* 37.6*  PLT 326 412* 411*   Coag's No results for input(s): APTT, INR in the last 168 hours. BMET  Recent Labs Lab 06/12/14 1243 06/12/14 1841 06/13/14 0428  NA 138 131* 140  K 4.5 2.9* 4.8  CL 104 104 104  CO2 19 14* 21  BUN 19 14 21   CREATININE 1.01 0.68 0.99  GLUCOSE 211* 175* 204*   Electrolytes  Recent Labs Lab 06/12/14 1243 06/12/14 1841 06/13/14 0428  CALCIUM 7.8* 5.7* 8.8  MG 1.8  --   --   PHOS 3.6  --   --    Sepsis Markers  Recent Labs Lab 06/12/14 1514  LATICACIDVEN 2.9*   ABG  Recent Labs Lab 06/12/14 1235 06/12/14 1536 06/13/14 0445  PHART 7.271* 7.266* 7.441  PCO2ART 32.4* 41.7 30.2*  PO2ART 79.0* 78.0* 220.0*   Liver Enzymes  Recent Labs Lab 06/12/14 1243 06/13/14 0428  AST 354* 959*  ALT 74* 203*  ALKPHOS 123* 110  BILITOT 0.4 0.5  ALBUMIN 3.7 3.3*   Cardiac Enzymes  Recent Labs Lab 06/12/14 1243 06/12/14  1830 06/12/14 2131  TROPONINI >20.00* >20.00* >20.00*   Glucose  Recent Labs Lab 06/12/14 1318 06/12/14 1516 06/12/14 2007 06/12/14 2341 06/13/14 0418  GLUCAP 242* 240* 237* 232* 197*    Imaging Dg Chest Port 1 View  06/12/2014   CLINICAL DATA:  68 year old male with ST elevation MI in an intra-aortic balloon pump in place. The balloon pump has been adjusted.  EXAM: PORTABLE CHEST - 1 VIEW  COMPARISON:  Prior chest x-ray obtained earlier today at 14:29 p.m.  FINDINGS: The endotracheal tube remains 4.3 cm above the carina. Stable position of right IJ central venous catheter with the tip overlying the superior cavoatrial junction. The metallic marker of  an intra-aortic balloon pump projects over the transverse aorta. This is likely in the most proximal descending thoracic aorta. Unchanged cardiac and mediastinal contours. Diffuse pulmonary edema is unchanged. No pneumothorax. No acute osseous abnormality.  IMPRESSION: 1. The intra-aortic balloon pump has been advanced. The tip now overlies the transverse aorta and is likely within the most proximal descending thoracic aorta. 2. Otherwise, unchanged chest x-ray.   Electronically Signed   By: Jacqulynn Cadet M.D.   On: 06/12/2014 14:59   Dg Chest Port 1 View  06/12/2014   CLINICAL DATA:  Acute pulmonary edema and respiratory distress. Status post endotracheal tube and central line placement.  EXAM: PORTABLE CHEST - 1 VIEW  COMPARISON:  03/21/2013.  FINDINGS: Endotracheal tube tip projects 5 cm above the carina.  Right internal jugular central venous line tip lies in the lower superior vena cava just above the caval atrial junction. No pneumothorax.  Nasogastric tube passes below the diaphragm and below the included field of view, into the stomach.  There is hazy airspace opacity in a left perihilar and more diffuse central right lung distribution. This is consistent with pulmonary edema, asymmetric.  Cardiac silhouette is normal in size.  IMPRESSION: 1. Endotracheal tube tip projects 5 cm above the carina. 2. Right internal jugular central venous line tip lies in the lower superior vena cava just above the caval atrial junction. 3. Nasogastric tube passes into the stomach. 4. Hazy asymmetric airspace opacity, right greater than left, consistent with pulmonary edema. 5. No pneumothorax.   Electronically Signed   By: Lajean Manes M.D.   On: 06/12/2014 14:49     ASSESSMENT / PLAN:  PULMONARY OETT 12/12>>> Acute respiratory failure - r/t pulm edema in setting STEMI, VT Mixed respiratory/ metabolic acidosis  Ankylosing spond = difficult airway (glide scope mandatory) P:   ABG reviewed, reduce rate Wean  is plan after further peep reduction To peep 5 then wean cpap 5 PS 5 Neg balance goals  CARDIOVASCULAR CVL 12/12 (DB)>>> STEMI - occluded LAD, DES placed 12/12 CAD  VT  Hypotension - cardiogenic shock  P:  S/p cath with stent placement  Cont anticoagulation per cards  Amiodarone per cards, brady noted Levophed gtt for MAP >65 Lidocaine gtt per cards Vaso can remain Balloon pump heparin may need to dc pump and heparin  RENAL Metabolic acidosis  P:   Stat chem  rx hyperkalemia suspected in setting acidosis (treated in code) Replace electrolytes  Lactic acid If K up still will need bicarb drip  GASTROINTESTINAL GI bleed (on multiple anticoagulants, anti plat), abdo distention, shock liver P:   OG tube to suction  KUB PPI drip, maintain Likely need GI consult to determine safety of such important anticoagulants (at some point), no sig drop in hct Cbc following LFt to follow Need  coags PT for innate liver fxn affect  HEMATOLOGIC STEMI, GI bleed (no drop hgb) P:  Monitor cbc q12h plavix important for stent Hep for pump  INFECTIOUS Leukocytosis improved P:   Follow fever curve  ENDOCRINE DM , r/o Rel AI P:   SSI  Get cortisol pending Add stress roids as on levo 30 Get tsh  NEUROLOGIC AMS - r/t hypotension in setting STEMI, shock  P:   RASS goal: -1 Fentanyl gtt  - dc as able WUA  FAMILY  - Updates: no family available 12/13, wife  Global: wean, get cortisol, like to extubate, consider dc hep with pump, plavix important, cbc in afternoon  Ccm time 30 min  Lavon Paganini. Titus Mould, MD, [place] Pgr: [facility] 06/13/2014 8:11 AM

## 2014-06-13 NOTE — Progress Notes (Signed)
Pt made RN aware of trouble with elevated liver enzymes in the past on lipitor. Dr. Wynonia Lawman notified. Held dose tonight per MD. Will defer decision on cholesterol med to primary cardiology.   Dakota Vanwart K, RN

## 2014-06-13 NOTE — Progress Notes (Signed)
Wasted 50 mL of fentanyl gtt with Lillia Abed. Scott [name]

## 2014-06-13 NOTE — Procedures (Signed)
Extubation Procedure Note  Patient Details:   Name: Scott [name] DOB: Nov 02, 1945 MRN: [id number]   Airway Documentation:     Evaluation  O2 sats: stable throughout Complications: No apparent complications Patient did tolerate procedure well. Bilateral Breath Sounds: Rhonchi Suctioning: Airway Yes  Maree Erie Latrice 06/13/2014, 10:56 AM

## 2014-06-13 NOTE — Progress Notes (Signed)
ANTICOAGULATION CONSULT NOTE  Pharmacy Consult for  heparin  Indication:  IABP s/p cath  No Known Allergies  Patient Measurements: Height: 5\' 8"  (172.7 cm) Weight: 227 lb 1.2 oz (103 kg) IBW/kg (Calculated) : 68.4 Heparin dosing Wt 90kg  Vital Signs: Temp: 98.8 F (37.1 C) (12/13 1200) Temp Source: Oral (12/13 1200) BP: 104/43 mmHg (12/13 1500) Pulse Rate: 87 (12/13 1500)  Labs:  Recent Labs  06/12/14 1243 06/12/14 1830 06/12/14 1841 06/12/14 2125 06/12/14 2131 06/13/14 0428 06/13/14 0955 06/13/14 1400  HGB 13.6  --  10.4*  --  12.7* 12.7*  --   --   HCT 40.4  --  31.1*  --  37.8* 37.6*  --   --   PLT 422*  --  326  --  412* 411*  --   --   APTT  --   --   --   --   --   --  55*  --   LABPROT  --   --   --   --   --   --  14.7  --   INR  --   --   --   --   --   --  1.13  --   HEPARINUNFRC  --   --   --  0.15*  --  <0.10*  --  0.23*  CREATININE 1.01  --  0.68  --   --  0.99  --   --   TROPONINI >20.00* >20.00*  --   --  >20.00*  --   --   --     Estimated Creatinine Clearance: 85.3 mL/min (by C-G formula based on Cr of 0.99).  Assessment: 68 yo male with STEMI s/p PCI, now with IABP, on heparin.  Aggrastat  stopped due to hematemesis 12/12 pm now improved and extubated.  Heparin drip 950 uts/hr HL 0.23 at goal, CBC stable.    Goal of Therapy:  Heparin level 0.2-0.5 units/ml  Monitor platelets by anticoagulation protocol: Yes   Plan:  Continue heparin 950 uts/hr  Daily CBC, HL  Bonnita Nasuti Pharm.D. CPP, BCPS Clinical Pharmacist [phone] 06/13/2014 3:19 PM

## 2014-06-13 NOTE — Progress Notes (Signed)
Subjective:   68 y/o with h/o CAD, DM2, carotid stenting. Admitted 12/12 with anterior STEMI. S/p PCI LAD. IABP placed.  Developed VT and respiratory failure post cath. Cardioverted and intubated. Amio started   Was on aggrastat for heavy thrombus burden. Stopped due to oral bleeding.  Remains intubated. Awake on vent. Following commands. Weaning now.  Remains on levophed 26. MAP 70-80s. Occasional runs NSVT on amio.   Echo with EF 20%. IABP placement ok by CXR. CVP 10.     Intake/Output Summary (Last 24 hours) at 06/13/14 1029 Last data filed at 06/13/14 1000  Gross per 24 hour  Intake 3368.21 ml  Output   3925 ml  Net -556.79 ml    Current meds: . amiodarone  150 mg Intravenous Once  . antiseptic oral rinse  7 mL Mouth Rinse QID  . atorvastatin  80 mg Oral q1800  . chlorhexidine  15 mL Mouth Rinse BID  . clopidogrel  75 mg Oral Q breakfast  . fentaNYL  50 mcg Intravenous Once  . hydrocortisone sodium succinate  50 mg Intravenous Q6H  . insulin aspart  0-9 Units Subcutaneous 6 times per day  . lidocaine  100 mg Other Once  . [START ON 06/16/2014] pantoprazole (PROTONIX) IV  40 mg Intravenous Q12H  . sodium chloride  10-40 mL Intracatheter Q12H  . sodium chloride  10-40 mL Intracatheter Q12H   Infusions: . sodium chloride 10 mL/hr at 06/13/14 0800  . amiodarone 30 mg/hr (06/13/14 0800)  . fentaNYL infusion INTRAVENOUS 50 mcg/hr (06/13/14 0900)  . heparin 950 Units/hr (06/13/14 0800)  . norepinephrine (LEVOPHED) Adult infusion 26 mcg/min (06/13/14 0930)  . pantoprozole (PROTONIX) infusion 8 mg/hr (06/13/14 0800)  . vasopressin (PITRESSIN) infusion - *FOR SHOCK* 0.03 Units/min (06/13/14 0800)     Objective:  Blood pressure 100/54, pulse 76, temperature 99.3 F (37.4 C), temperature source Oral, resp. rate 15, height 5\' 8"  (1.727 m), weight 103 kg (227 lb 1.2 oz), SpO2 95 %. Weight change:  CVP 10 Physical Exam: General:  Intubated. Awake following commands. Asking  for ETT to come out.  HEENT: normal Neck: supple. RIJ TLC . Carotids 2+ bilat; no bruits. No lymphadenopathy or thryomegaly appreciated. Cor: PMI nondisplaced. Regular rate & rhythm. No rubs, gallops or murmurs. Lungs: clear Abdomen: soft, nontender, nondistended. No hepatosplenomegaly. No bruits or masses. Good bowel sounds. Extremities: no cyanosis, clubbing, rash, edema IABP site ok.  Neuro: alert & orientedx3, cranial nerves grossly intact. moves all 4 extremities w/o difficulty.   Telemetry: SR occasional NSVT  Lab Results: Basic Metabolic Panel:  Recent Labs Lab 06/12/14 1243 06/12/14 1841 06/13/14 0428  NA 138 131* 140  K 4.5 2.9* 4.8  CL 104 104 104  CO2 19 14* 21  GLUCOSE 211* 175* 204*  BUN 19 14 21   CREATININE 1.01 0.68 0.99  CALCIUM 7.8* 5.7* 8.8  MG 1.8  --   --   PHOS 3.6  --   --    Liver Function Tests:  Recent Labs Lab 06/12/14 1243 06/13/14 0428  AST 354* 959*  ALT 74* 203*  ALKPHOS 123* 110  BILITOT 0.4 0.5  PROT 7.2 6.6  ALBUMIN 3.7 3.3*   No results for input(s): LIPASE, AMYLASE in the last 168 hours. No results for input(s): AMMONIA in the last 168 hours. CBC:  Recent Labs Lab 06/12/14 1243 06/12/14 1841 06/12/14 2131 06/13/14 0428  WBC 19.6* 17.4* 22.3* 19.8*  NEUTROABS 18.1*  --  18.8* 16.1*  HGB 13.6  10.4* 12.7* 12.7*  HCT 40.4 31.1* 37.8* 37.6*  MCV 94.2 94.0 93.3 93.1  PLT 422* 326 412* 411*   Cardiac Enzymes:  Recent Labs Lab 06/12/14 1243 06/12/14 1830 06/12/14 2131  TROPONINI >20.00* >20.00* >20.00*   BNP: Invalid input(s): POCBNP CBG:  Recent Labs Lab 06/12/14 1318 06/12/14 1516 06/12/14 2007 06/12/14 2341 06/13/14 0418  GLUCAP 242* 240* 237* 232* 197*   Microbiology: No results found for: CULT No results for input(s): CULT, SDES in the last 168 hours.  Imaging: Dg Abd 1 View  06/13/2014   CLINICAL DATA:  68 year old male with acute abdominal pain and possible GI bleed  EXAM: ABDOMEN - 1 VIEW   COMPARISON:  Prior chest x-ray obtained earlier today  FINDINGS: The bowel gas pattern is not obstructed. No large free air on the supine radiograph. 3 mm radiopacity projecting over the lower pole of the left renal shadow consistent with nephrolithiasis. Additionally, the gallbladder is dense suggesting multiple small stones or radiopaque sludge. A nasogastric tube is present. The tip is in the gastric antrum. No evidence of large volume ascites. Tubing overlies the right inguinal region. These may represent vascular catheters were tubing external to the patient. Multilevel degenerative disc disease. L2 and L3 compression fractures do not appear acute.  IMPRESSION: 1. Left nephrolithiasis.  3 mm stone overlies the lower pole. 2. Dense gallbladder in the right upper quadrant may reflect underlying sludge and/or small stones. 3. Nasogastric tube within the stomach. The tip is in the gastric antrum. 4. Nonobstructed bowel gas pattern. 5. Chronic appearing L2 and L3 compression fractures. 6. Tubing overlying the right inguinal region may represent femoral vascular catheters or tubing external to the patient.   Electronically Signed   By: Jacqulynn Cadet M.D.   On: 06/13/2014 10:04   Dg Chest Port 1 View  06/13/2014   CLINICAL DATA:  68 year old male with acute pulmonary edema and ST elevation myocardial infarction  EXAM: PORTABLE CHEST - 1 VIEW  COMPARISON:  Prior chest x-ray 06/12/2014  FINDINGS: The endotracheal tube is 4.4 cm above the carina. A right IJ central venous catheter remains in place with the tip overlying the superior cavoatrial junction. The metallic marker of an intra-aortic balloon pump projects over the proximal descending thoracic aorta. Nasogastric tube is present but incompletely imaged. The tip lies off the field of view presumably within the stomach.  Persistent low inspiratory volumes. Improving pulmonary edema. Stable cardiomegaly. No pneumothorax or large effusion. No acute osseous  abnormality.  IMPRESSION: 1. Stable and satisfactory support apparatus. 2. Persistent but improving pulmonary edema.   Electronically Signed   By: Jacqulynn Cadet M.D.   On: 06/13/2014 07:49   Dg Chest Port 1 View  06/12/2014   CLINICAL DATA:  68 year old male with ST elevation MI in an intra-aortic balloon pump in place. The balloon pump has been adjusted.  EXAM: PORTABLE CHEST - 1 VIEW  COMPARISON:  Prior chest x-ray obtained earlier today at 14:29 p.m.  FINDINGS: The endotracheal tube remains 4.3 cm above the carina. Stable position of right IJ central venous catheter with the tip overlying the superior cavoatrial junction. The metallic marker of an intra-aortic balloon pump projects over the transverse aorta. This is likely in the most proximal descending thoracic aorta. Unchanged cardiac and mediastinal contours. Diffuse pulmonary edema is unchanged. No pneumothorax. No acute osseous abnormality.  IMPRESSION: 1. The intra-aortic balloon pump has been advanced. The tip now overlies the transverse aorta and is likely within the most proximal  descending thoracic aorta. 2. Otherwise, unchanged chest x-ray.   Electronically Signed   By: Jacqulynn Cadet M.D.   On: 06/12/2014 14:59   Dg Chest Port 1 View  06/12/2014   CLINICAL DATA:  Acute pulmonary edema and respiratory distress. Status post endotracheal tube and central line placement.  EXAM: PORTABLE CHEST - 1 VIEW  COMPARISON:  03/21/2013.  FINDINGS: Endotracheal tube tip projects 5 cm above the carina.  Right internal jugular central venous line tip lies in the lower superior vena cava just above the caval atrial junction. No pneumothorax.  Nasogastric tube passes below the diaphragm and below the included field of view, into the stomach.  There is hazy airspace opacity in a left perihilar and more diffuse central right lung distribution. This is consistent with pulmonary edema, asymmetric.  Cardiac silhouette is normal in size.  IMPRESSION: 1.  Endotracheal tube tip projects 5 cm above the carina. 2. Right internal jugular central venous line tip lies in the lower superior vena cava just above the caval atrial junction. 3. Nasogastric tube passes into the stomach. 4. Hazy asymmetric airspace opacity, right greater than left, consistent with pulmonary edema. 5. No pneumothorax.   Electronically Signed   By: Lajean Manes M.D.   On: 06/12/2014 14:49     ASSESSMENT:  1. Anterior STEMI s/p LAD stent 12/12 2. CAD 3. Acute systolic HF 4. Ventricular tachycardia s/p DC-CV on 12/12 5. Acute VDRF 6. Oral bleeding 7. Shock liver 8. Ankylosing spondylitis   PLAN/DISCUSSION:  Much improved this am. Weaning from vent. He now has severe LV dysfunction. Would continue IABP for at least 24 more hours. Will try to wean levophed slowly. Continue heparin, ASA, statin and Plavix. Bleeding is likely oral and not GI. CVP ok. Will not diurese further today. Will likely restart lasix in am. Start digoxin. BP too soft for ACE/BB. Continue IV amio today.   The HF team is happy to follow.   The patient is critically ill with multiple organ systems failure and requires high complexity decision making for assessment and support, frequent evaluation and titration of therapies, application of advanced monitoring technologies and extensive interpretation of multiple databases.   Critical Care Time devoted to patient care services described in this note is 40 Minutes.    LOS: 1 day  [name], MD 06/13/2014, 10:29 AM

## 2014-06-13 NOTE — Progress Notes (Addendum)
ANTICOAGULATION CONSULT NOTE  Pharmacy Consult for  heparin  Indication:  IABP s/p cath  No Known Allergies  Patient Measurements: Height: 5\' 8"  (172.7 cm) Weight: 227 lb 1.2 oz (103 kg) IBW/kg (Calculated) : 68.4 Heparin dosing Wt 90kg  Vital Signs: Temp: 97.6 F (36.4 C) (12/12 2000) Temp Source: Axillary (12/12 2000) BP: 107/71 mmHg (12/13 0047) Pulse Rate: 66 (12/13 0047)  Labs:  Recent Labs  06/12/14 1243 06/12/14 1830 06/12/14 1841 06/12/14 2125 06/12/14 2131  HGB 13.6  --  10.4*  --  12.7*  HCT 40.4  --  31.1*  --  37.8*  PLT 422*  --  326  --  412*  HEPARINUNFRC  --   --   --  0.15*  --   CREATININE 1.01  --  0.68  --   --   TROPONINI >20.00* >20.00*  --   --   --     Estimated Creatinine Clearance: 105.6 mL/min (by C-G formula based on Cr of 0.68).  Assessment: 68 yo male with STEMI s/p PCI, now with IABP, for heparin.  Aggrastat and ASA stopped due to hematemesis  Goal of Therapy:  Heparin level 0.2-0.5 units/ml  Monitor platelets by anticoagulation protocol: Yes   Plan:  As Heparin had only been infusing for 5 hours prior to level and was off briefly just prior to blood draw, will continue heparin at current rate for now. Recheck level with AM labs  Phillis Knack, PharmD, BCPS  06/13/2014 1:05 AM     Addendum: Heparin level this morning now < 0 .1.  Bleeding issues have improved per RN.  Hemoglobin is stable.  Will increase heparin 950 units/hr  Check heparin level in 8 hours.  Phillis Knack, PharmD, BCPS 06/13/2014 6:09 AM

## 2014-06-14 ENCOUNTER — Other Ambulatory Visit: Payer: Self-pay

## 2014-06-14 DIAGNOSIS — E872 Acidosis: Secondary | ICD-10-CM

## 2014-06-14 DIAGNOSIS — I2109 ST elevation (STEMI) myocardial infarction involving other coronary artery of anterior wall: Principal | ICD-10-CM

## 2014-06-14 DIAGNOSIS — R570 Cardiogenic shock: Secondary | ICD-10-CM

## 2014-06-14 LAB — POCT ACTIVATED CLOTTING TIME
ACTIVATED CLOTTING TIME: 104 s
Activated Clotting Time: 374 seconds

## 2014-06-14 LAB — COMPREHENSIVE METABOLIC PANEL
ALT: 151 U/L — ABNORMAL HIGH (ref 0–53)
AST: 451 U/L — ABNORMAL HIGH (ref 0–37)
Albumin: 3.3 g/dL — ABNORMAL LOW (ref 3.5–5.2)
Alkaline Phosphatase: 104 U/L (ref 39–117)
Anion gap: 12 (ref 5–15)
BUN: 16 mg/dL (ref 6–23)
CO2: 25 mEq/L (ref 19–32)
Calcium: 8.8 mg/dL (ref 8.4–10.5)
Chloride: 103 mEq/L (ref 96–112)
Creatinine, Ser: 0.94 mg/dL (ref 0.50–1.35)
GFR calc Af Amer: >90 mL/min (ref >90)
GFR calc non Af Amer: 85 mL/min — ABNORMAL LOW (ref >90)
Glucose, Bld: 142 mg/dL — ABNORMAL HIGH (ref 70–99)
Potassium: 3.7 mEq/L (ref 3.7–5.3)
Sodium: 140 mEq/L (ref 137–147)
Total Bilirubin: 0.5 mg/dL (ref 0.3–1.2)
Total Protein: 6.9 g/dL (ref 6.0–8.3)

## 2014-06-14 LAB — CARBOXYHEMOGLOBIN
Carboxyhemoglobin: 0.9 % (ref 0.5–1.5)
Methemoglobin: 0.9 % (ref 0.0–1.5)
O2 Saturation: 65.6 %
Total hemoglobin: 15.4 g/dL (ref 13.5–18.0)

## 2014-06-14 LAB — CBC
HCT: 35.5 % — ABNORMAL LOW (ref 39.0–52.0)
Hemoglobin: 11.7 g/dL — ABNORMAL LOW (ref 13.0–17.0)
MCH: 31 pg (ref 26.0–34.0)
MCHC: 33 g/dL (ref 30.0–36.0)
MCV: 93.9 fL (ref 78.0–100.0)
Platelets: 334 10*3/uL (ref 150–400)
RBC: 3.78 MIL/uL — ABNORMAL LOW (ref 4.22–5.81)
RDW: 13.8 % (ref 11.5–15.5)
WBC: 20.5 10*3/uL — ABNORMAL HIGH (ref 4.0–10.5)

## 2014-06-14 LAB — GLUCOSE, CAPILLARY
GLUCOSE-CAPILLARY: 131 mg/dL — AB (ref 70–99)
GLUCOSE-CAPILLARY: 108 mg/dL — AB (ref 70–99)
Glucose-Capillary: 106 mg/dL — ABNORMAL HIGH (ref 70–99)
Glucose-Capillary: 111 mg/dL — ABNORMAL HIGH (ref 70–99)
Glucose-Capillary: 118 mg/dL — ABNORMAL HIGH (ref 70–99)
Glucose-Capillary: 134 mg/dL — ABNORMAL HIGH (ref 70–99)

## 2014-06-14 LAB — HEPARIN LEVEL (UNFRACTIONATED): Heparin Unfractionated: 0.17 IU/mL — ABNORMAL LOW (ref 0.30–0.70)

## 2014-06-14 LAB — CORTISOL: CORTISOL PLASMA: 44.5 ug/dL

## 2014-06-14 MED ORDER — SPIRONOLACTONE 12.5 MG HALF TABLET
12.5000 mg | ORAL_TABLET | Freq: Every day | ORAL | Status: DC
  Administered 2014-06-14 – 2014-06-17 (×4): 12.5 mg via ORAL
  Filled 2014-06-14 (×4): qty 1

## 2014-06-14 MED ORDER — ROSUVASTATIN CALCIUM 40 MG PO TABS
40.0000 mg | ORAL_TABLET | Freq: Every day | ORAL | Status: DC
  Administered 2014-06-14 – 2014-06-16 (×3): 40 mg via ORAL
  Filled 2014-06-14 (×5): qty 1

## 2014-06-14 MED ORDER — AMIODARONE HCL 200 MG PO TABS
400.0000 mg | ORAL_TABLET | Freq: Two times a day (BID) | ORAL | Status: DC
  Administered 2014-06-14 – 2014-06-17 (×6): 400 mg via ORAL
  Filled 2014-06-14 (×8): qty 2

## 2014-06-14 MED ORDER — HEPARIN SODIUM (PORCINE) 5000 UNIT/ML IJ SOLN
5000.0000 [IU] | Freq: Three times a day (TID) | INTRAMUSCULAR | Status: DC
  Administered 2014-06-14 – 2014-06-17 (×8): 5000 [IU] via SUBCUTANEOUS
  Filled 2014-06-14 (×11): qty 1

## 2014-06-14 MED ORDER — ASPIRIN EC 81 MG PO TBEC
81.0000 mg | DELAYED_RELEASE_TABLET | Freq: Every day | ORAL | Status: DC
  Administered 2014-06-14 – 2014-06-17 (×4): 81 mg via ORAL
  Filled 2014-06-14 (×4): qty 1

## 2014-06-14 MED ORDER — INSULIN ASPART 100 UNIT/ML ~~LOC~~ SOLN: 0.0000 [IU] | Freq: Three times a day (TID) | SUBCUTANEOUS | Status: DC

## 2014-06-14 MED ORDER — INSULIN ASPART 100 UNIT/ML ~~LOC~~ SOLN: 0.0000 [IU] | Freq: Every day | SUBCUTANEOUS | Status: DC

## 2014-06-14 MED ORDER — PANTOPRAZOLE SODIUM 40 MG PO TBEC
40.0000 mg | DELAYED_RELEASE_TABLET | Freq: Every day | ORAL | Status: DC
  Administered 2014-06-15 – 2014-06-17 (×3): 40 mg via ORAL
  Filled 2014-06-14 (×3): qty 1

## 2014-06-14 MED FILL — Furosemide Inj 10 MG/ML: INTRAMUSCULAR | Qty: 4 | Status: AC

## 2014-06-14 MED FILL — Amiodarone HCl Inj 150 MG/3ML (50 MG/ML): INTRAVENOUS | Qty: 3 | Status: AC

## 2014-06-14 MED FILL — Tirofiban HCl in NaCl IV Soln 25 MG/500ML (Base Equiv): INTRAVENOUS | Qty: 100 | Status: AC

## 2014-06-14 MED FILL — Bivalirudin For IV Soln 250 MG: INTRAVENOUS | Qty: 250 | Status: AC

## 2014-06-14 MED FILL — Heparin Sodium (Porcine) 2 Unit/ML in Sodium Chloride 0.9%: INTRAMUSCULAR | Qty: 2000 | Status: AC

## 2014-06-14 MED FILL — Nitroglycerin IV Soln 100 MCG/ML in D5W: INTRA_ARTERIAL | Qty: 10 | Status: AC

## 2014-06-14 MED FILL — Morphine Sulfate Inj 10 MG/ML: INTRAMUSCULAR | Qty: 1 | Status: AC

## 2014-06-14 MED FILL — Lidocaine HCl Local Preservative Free (PF) Inj 1%: INTRAMUSCULAR | Qty: 30 | Status: AC

## 2014-06-14 MED FILL — Sodium Chloride IV Soln 0.9%: INTRAVENOUS | Qty: 50 | Status: AC

## 2014-06-14 MED FILL — Norepinephrine Bitartrate IV Soln 1 MG/ML (Base Equivalent): INTRAVENOUS | Qty: 4 | Status: AC

## 2014-06-14 MED FILL — Dopamine HCl Inj 40 MG/ML: INTRAVENOUS | Qty: 10 | Status: AC

## 2014-06-14 NOTE — Progress Notes (Signed)
PT Cancellation Note  Patient Details Name: CAROLS CLEMENCE MRN: [id number] DOB: [birth year]   Cancelled Treatment:    Reason Eval/Treat Not Completed: Medical issues which prohibited therapy (Pt still on balloon pump.)   Anyi Fels 06/14/2014, 10:44 AM

## 2014-06-14 NOTE — Progress Notes (Signed)
ANTICOAGULATION CONSULT NOTE - Follow Up Consult  Pharmacy Consult for Heparin  Indication: IABP  No Known Allergies  Patient Measurements: Height: 5\' 8"  (172.7 cm) Weight: 227 lb 1.2 oz (103 kg) IBW/kg (Calculated) : 68.4   Vital Signs: Temp: 98.1 F (36.7 C) (12/14 0400) Temp Source: Oral (12/14 0400) BP: 114/68 mmHg (12/14 0400) Pulse Rate: 91 (12/14 0400)  Labs:  Recent Labs  06/12/14 1243 06/12/14 1830 06/12/14 1841  06/12/14 2131 06/13/14 0428 06/13/14 0955 06/13/14 1400 06/13/14 1600 06/14/14 0430  HGB 13.6  --  10.4*  --  12.7* 12.7*  --   --  11.4* 11.7*  HCT 40.4  --  31.1*  --  37.8* 37.6*  --   --  34.2* 35.5*  PLT 422*  --  326  --  412* 411*  --   --  343 334  APTT  --   --   --   --   --   --  55*  --   --   --   LABPROT  --   --   --   --   --   --  14.7  --   --   --   INR  --   --   --   --   --   --  1.13  --   --   --   HEPARINUNFRC  --   --   --   < >  --  <0.10*  --  0.23*  --  0.17*  CREATININE 1.01  --  0.68  --   --  0.99  --   --   --  0.94  TROPONINI >20.00* >20.00*  --   --  >20.00*  --   --   --   --   --   < > = values in this interval not displayed.  Estimated Creatinine Clearance: 89.9 mL/min (by C-G formula based on Cr of 0.94).   Assessment: IABP, slightly sub-therapeutic heparin level, Hgb fairly stable, bleeding issues seem to be resolved.   Goal of Therapy:  Heparin level 0.2-0.5 units/ml Monitor platelets by anticoagulation protocol: Yes   Plan:  -Increase heparin drip to 1050 units/hr -1300 HL -Daily CBC/HL -Monitor for bleeding  Narda Bonds 06/14/2014,5:23 AM

## 2014-06-14 NOTE — Progress Notes (Signed)
Site area right groin, IABP  - rt. femoral artery  and rt venous sheath - rt. femoral vein  Site Prior to Removal:  Level 0  Pressure Applied For 35 MINUTES    Minutes Beginning at:11:05  Manual:   Yes.    Patient Status During Pull:  Stable and Scott [name] at bedside during sheath pull    Post Pull Groin Site:  Level 0  Post Pull Instructions Given:  Yes.    Post Pull Pulses Present:  Yes , by doppler  Dressing Applied:  Yes.    Comments: Sheath pull was done MWRight RT-R

## 2014-06-14 NOTE — Plan of Care (Signed)
Problem: Phase I Progression Outcomes Goal: Vascular site scale level 0 - I Vascular Site Scale Level 0: No bruising/bleeding/hematoma Level I (Mild): Bruising/Ecchymosis, minimal bleeding/ooozing, palpable hematoma < 3 cm Level II (Moderate): Bleeding not affecting hemodynamic parameters, pseudoaneurysm, palpable hematoma > 3 cm Level III (Severe) Bleeding which affects hemodynamic parameters or retroperitoneal hemorrhage  Outcome: Completed/Met Date Met:  06/14/14 Sheath and IABP removed, slight bruising at site.

## 2014-06-14 NOTE — Progress Notes (Signed)
Utilization review complete. Natividad Schlosser RN CCM Case Mgmt phone [phone] 

## 2014-06-14 NOTE — Progress Notes (Signed)
PCCM Interval Note  Follow up with pt s/p extubation on 12/13. He has tolerated, is improving significantly. IABP weaning to begin today. Cardiology managing his issues now. PCCM will sign off. Please call if we can help in any way.   Baltazar Apo, MD, PhD 06/14/2014, 11:17 AM [facility] [phone] or if no answer [phone]

## 2014-06-14 NOTE — Progress Notes (Signed)
Subjective:   68 y/o with h/o CAD, DM2, carotid stenting. Admitted 12/12 with anterior STEMI. S/p PCI LAD. IABP placed.  Developed VT and respiratory failure post cath. Cardioverted and intubated. Amio started   Extubated yesterday.  Feels well. IABP still in on 1:1. On levophed at 21 (weaning). MAPs ~100. . WBC remains high. No fevers  Echo with EF 20%. IABP placement ok by CXR. CVP 10.     Intake/Output Summary (Last 24 hours) at 06/14/14 2025 Last data filed at 06/14/14 0700  Gross per 24 hour  Intake 1902.18 ml  Output   3910 ml  Net -2007.82 ml    Current meds: . amiodarone  150 mg Intravenous Once  . antiseptic oral rinse  7 mL Mouth Rinse QID  . atorvastatin  80 mg Oral q1800  . chlorhexidine  15 mL Mouth Rinse BID  . clopidogrel  75 mg Oral Q breakfast  . digoxin  0.125 mg Oral Daily  . fentaNYL  50 mcg Intravenous Once  . hydrocortisone sodium succinate  50 mg Intravenous Q6H  . insulin aspart  0-9 Units Subcutaneous 6 times per day  . lidocaine  100 mg Other Once  . [START ON 06/16/2014] pantoprazole (PROTONIX) IV  40 mg Intravenous Q12H  . sodium chloride  10-40 mL Intracatheter Q12H  . sodium chloride  10-40 mL Intracatheter Q12H   Infusions: . sodium chloride 10 mL/hr at 06/13/14 0800  . amiodarone 30 mg/hr (06/14/14 0129)  . fentaNYL infusion INTRAVENOUS Stopped (06/13/14 1100)  . heparin 1,050 Units/hr (06/14/14 0535)  . norepinephrine (LEVOPHED) Adult infusion 14 mcg/min (06/14/14 0600)  . pantoprozole (PROTONIX) infusion 8 mg/hr (06/14/14 0401)  . vasopressin (PITRESSIN) infusion - *FOR SHOCK* Stopped (06/13/14 1100)     Objective:  Blood pressure 101/58, pulse 92, temperature 98.2 F (36.8 C), temperature source Oral, resp. rate 21, height 5\' 8"  (1.727 m), weight 99 kg (218 lb 4.1 oz), SpO2 93 %. Weight change: 2 kg (4 lb 6.6 oz) CVP 10 Physical Exam: General: lying in bed awake  HEENT: normal Neck: supple. RIJ TLC . Carotids 2+ bilat; no  bruits. No lymphadenopathy or thryomegaly appreciated. Cor: PMI nondisplaced. Regular rate & rhythm. No rubs, gallops or murmurs. Lungs: clear Abdomen: soft, nontender, nondistended. No hepatosplenomegaly. No bruits or masses. Good bowel sounds. Extremities: no cyanosis, clubbing, rash, edema IABP site ok.  Neuro: alert & orientedx3, cranial nerves grossly intact. moves all 4 extremities w/o difficulty.   Telemetry: SR occasional NSVT. Persistent ST elevation  Lab Results: Basic Metabolic Panel:  Recent Labs Lab 06/12/14 1243 06/12/14 1841 06/13/14 0428 06/14/14 0430  NA 138 131* 140 140  K 4.5 2.9* 4.8 3.7  CL 104 104 104 103  CO2 19 14* 21 25  GLUCOSE 211* 175* 204* 142*  BUN 19 14 21 16   CREATININE 1.01 0.68 0.99 0.94  CALCIUM 7.8* 5.7* 8.8 8.8  MG 1.8  --   --   --   PHOS 3.6  --   --   --    Liver Function Tests:  Recent Labs Lab 06/12/14 1243 06/13/14 0428 06/14/14 0430  AST 354* 959* 451*  ALT 74* 203* 151*  ALKPHOS 123* 110 104  BILITOT 0.4 0.5 0.5  PROT 7.2 6.6 6.9  ALBUMIN 3.7 3.3* 3.3*   No results for input(s): LIPASE, AMYLASE in the last 168 hours. No results for input(s): AMMONIA in the last 168 hours. CBC:  Recent Labs Lab 06/12/14 1243 06/12/14 1841 06/12/14 2131  06/13/14 0428 06/13/14 1600 06/14/14 0430  WBC 19.6* 17.4* 22.3* 19.8* 18.0* 20.5*  NEUTROABS 18.1*  --  18.8* 16.1*  --   --   HGB 13.6 10.4* 12.7* 12.7* 11.4* 11.7*  HCT 40.4 31.1* 37.8* 37.6* 34.2* 35.5*  MCV 94.2 94.0 93.3 93.1 93.4 93.9  PLT 422* 326 412* 411* 343 334   Cardiac Enzymes:  Recent Labs Lab 06/12/14 1243 06/12/14 1830 06/12/14 2131  TROPONINI >20.00* >20.00* >20.00*   BNP: Invalid input(s): POCBNP CBG:  Recent Labs Lab 06/13/14 1139 06/13/14 1601 06/13/14 2050 06/14/14 0014 06/14/14 0409  GLUCAP 162* 122* 141* 111* 131*   Microbiology: No results found for: CULT No results for input(s): CULT, SDES in the last 168 hours.  Imaging: Dg Abd  1 View  06/13/2014   CLINICAL DATA:  68 year old male with acute abdominal pain and possible GI bleed  EXAM: ABDOMEN - 1 VIEW  COMPARISON:  Prior chest x-ray obtained earlier today  FINDINGS: The bowel gas pattern is not obstructed. No large free air on the supine radiograph. 3 mm radiopacity projecting over the lower pole of the left renal shadow consistent with nephrolithiasis. Additionally, the gallbladder is dense suggesting multiple small stones or radiopaque sludge. A nasogastric tube is present. The tip is in the gastric antrum. No evidence of large volume ascites. Tubing overlies the right inguinal region. These may represent vascular catheters were tubing external to the patient. Multilevel degenerative disc disease. L2 and L3 compression fractures do not appear acute.  IMPRESSION: 1. Left nephrolithiasis.  3 mm stone overlies the lower pole. 2. Dense gallbladder in the right upper quadrant may reflect underlying sludge and/or small stones. 3. Nasogastric tube within the stomach. The tip is in the gastric antrum. 4. Nonobstructed bowel gas pattern. 5. Chronic appearing L2 and L3 compression fractures. 6. Tubing overlying the right inguinal region may represent femoral vascular catheters or tubing external to the patient.   Electronically Signed   By: Jacqulynn Cadet M.D.   On: 06/13/2014 10:04   Dg Chest Port 1 View  06/13/2014   CLINICAL DATA:  68 year old male with acute pulmonary edema and ST elevation myocardial infarction  EXAM: PORTABLE CHEST - 1 VIEW  COMPARISON:  Prior chest x-ray 06/12/2014  FINDINGS: The endotracheal tube is 4.4 cm above the carina. A right IJ central venous catheter remains in place with the tip overlying the superior cavoatrial junction. The metallic marker of an intra-aortic balloon pump projects over the proximal descending thoracic aorta. Nasogastric tube is present but incompletely imaged. The tip lies off the field of view presumably within the stomach.  Persistent  low inspiratory volumes. Improving pulmonary edema. Stable cardiomegaly. No pneumothorax or large effusion. No acute osseous abnormality.  IMPRESSION: 1. Stable and satisfactory support apparatus. 2. Persistent but improving pulmonary edema.   Electronically Signed   By: Jacqulynn Cadet M.D.   On: 06/13/2014 07:49   Dg Chest Port 1 View  06/12/2014   CLINICAL DATA:  68 year old male with ST elevation MI in an intra-aortic balloon pump in place. The balloon pump has been adjusted.  EXAM: PORTABLE CHEST - 1 VIEW  COMPARISON:  Prior chest x-ray obtained earlier today at 14:29 p.m.  FINDINGS: The endotracheal tube remains 4.3 cm above the carina. Stable position of right IJ central venous catheter with the tip overlying the superior cavoatrial junction. The metallic marker of an intra-aortic balloon pump projects over the transverse aorta. This is likely in the most proximal descending thoracic aorta. Unchanged  cardiac and mediastinal contours. Diffuse pulmonary edema is unchanged. No pneumothorax. No acute osseous abnormality.  IMPRESSION: 1. The intra-aortic balloon pump has been advanced. The tip now overlies the transverse aorta and is likely within the most proximal descending thoracic aorta. 2. Otherwise, unchanged chest x-ray.   Electronically Signed   By: Jacqulynn Cadet M.D.   On: 06/12/2014 14:59   Dg Chest Port 1 View  06/12/2014   CLINICAL DATA:  Acute pulmonary edema and respiratory distress. Status post endotracheal tube and central line placement.  EXAM: PORTABLE CHEST - 1 VIEW  COMPARISON:  03/21/2013.  FINDINGS: Endotracheal tube tip projects 5 cm above the carina.  Right internal jugular central venous line tip lies in the lower superior vena cava just above the caval atrial junction. No pneumothorax.  Nasogastric tube passes below the diaphragm and below the included field of view, into the stomach.  There is hazy airspace opacity in a left perihilar and more diffuse central right lung  distribution. This is consistent with pulmonary edema, asymmetric.  Cardiac silhouette is normal in size.  IMPRESSION: 1. Endotracheal tube tip projects 5 cm above the carina. 2. Right internal jugular central venous line tip lies in the lower superior vena cava just above the caval atrial junction. 3. Nasogastric tube passes into the stomach. 4. Hazy asymmetric airspace opacity, right greater than left, consistent with pulmonary edema. 5. No pneumothorax.   Electronically Signed   By: Lajean Manes M.D.   On: 06/12/2014 14:49     ASSESSMENT:  1. Anterior STEMI s/p LAD stent 12/12 2. CAD 3. Acute systolic HF 4. Ventricular tachycardia s/p DC-CV on 12/12 5. Acute VDRF 6. Oral bleeding 7. Shock liver 8. Ankylosing spondylitis  9. H/o transamiitis on lipitor in past  PLAN/DISCUSSION:  Much improved this am. He now has severe LV dysfunction. Will attempt to wean IABP and levophed today. Continue heparin, ASA, statin and Plavix.  CVP 9. Will not diurese further today. Continue digoxin. Switch amio to po. BP too soft for ACE/BB. Switch amio to po. Switch lipitor to crestor due to transaminitis with lipitor in past. Start carvedilol in am. Low dose spiro today.    The patient is critically ill with multiple organ systems failure and requires high complexity decision making for assessment and support, frequent evaluation and titration of therapies, application of advanced monitoring technologies and extensive interpretation of multiple databases.   Critical Care Time devoted to patient care services described in this note is 40 Minutes.    LOS: 2 days  [name], MD 06/14/2014, 8:12 AM

## 2014-06-15 LAB — GLUCOSE, CAPILLARY
GLUCOSE-CAPILLARY: 98 mg/dL (ref 70–99)
GLUCOSE-CAPILLARY: 124 mg/dL — AB (ref 70–99)
Glucose-Capillary: 106 mg/dL — ABNORMAL HIGH (ref 70–99)
Glucose-Capillary: 111 mg/dL — ABNORMAL HIGH (ref 70–99)

## 2014-06-15 LAB — CBC
HCT: 33.5 % — ABNORMAL LOW (ref 39.0–52.0)
Hemoglobin: 11.1 g/dL — ABNORMAL LOW (ref 13.0–17.0)
MCH: 31.3 pg (ref 26.0–34.0)
MCHC: 33.1 g/dL (ref 30.0–36.0)
MCV: 94.4 fL (ref 78.0–100.0)
Platelets: 273 10*3/uL (ref 150–400)
RBC: 3.55 MIL/uL — ABNORMAL LOW (ref 4.22–5.81)
RDW: 13.6 % (ref 11.5–15.5)
WBC: 14.5 10*3/uL — ABNORMAL HIGH (ref 4.0–10.5)

## 2014-06-15 LAB — COMPREHENSIVE METABOLIC PANEL
ALK PHOS: 97 U/L (ref 39–117)
ALT: 95 U/L — AB (ref 0–53)
AST: 176 U/L — AB (ref 0–37)
Albumin: 2.9 g/dL — ABNORMAL LOW (ref 3.5–5.2)
Anion gap: 13 (ref 5–15)
BUN: 19 mg/dL (ref 6–23)
CHLORIDE: 104 meq/L (ref 96–112)
CO2: 22 meq/L (ref 19–32)
CREATININE: 0.91 mg/dL (ref 0.50–1.35)
Calcium: 8.8 mg/dL (ref 8.4–10.5)
GFR calc Af Amer: >90 mL/min (ref >90)
GFR, EST NON AFRICAN AMERICAN: 86 mL/min — AB (ref >90)
Glucose, Bld: 109 mg/dL — ABNORMAL HIGH (ref 70–99)
POTASSIUM: 3.7 meq/L (ref 3.7–5.3)
SODIUM: 139 meq/L (ref 137–147)
Total Bilirubin: 0.6 mg/dL (ref 0.3–1.2)
Total Protein: 6.5 g/dL (ref 6.0–8.3)

## 2014-06-15 MED ORDER — CARVEDILOL 3.125 MG PO TABS
3.1250 mg | ORAL_TABLET | Freq: Two times a day (BID) | ORAL | Status: DC
  Administered 2014-06-15 – 2014-06-17 (×4): 3.125 mg via ORAL
  Filled 2014-06-15 (×7): qty 1

## 2014-06-15 MED ORDER — LOSARTAN POTASSIUM 25 MG PO TABS
25.0000 mg | ORAL_TABLET | Freq: Every day | ORAL | Status: DC
  Administered 2014-06-15 – 2014-06-17 (×2): 25 mg via ORAL
  Filled 2014-06-15 (×4): qty 1

## 2014-06-15 NOTE — Plan of Care (Signed)
Problem: Food- and Nutrition-Related Knowledge Deficit (NB-1.1) Goal: Nutrition education Formal process to instruct or train a patient/client in a skill or to impart knowledge to help patients/clients voluntarily manage or modify food choices and eating behavior to maintain or improve health. Outcome: Completed/Met Date Met:  06/15/14 Consult received for [facility] diet education.  When RD entered room pt declined any education stating that this was not his first heart attack and that he had been through all this before.  Offered to answer any questions and/or provide support for diet changes.  Pt and wife did ask a few questions. Wife continues to cook with bacon fat and fry foods. Pt kept his eyes closed during most of conversation. Neither seemed willing to make changes at this time. Encouraged pt to follow up with outpatient RD with questions after d/c.   [place], [place], [place] Pager [phone] After Hours Pager

## 2014-06-15 NOTE — Progress Notes (Signed)
EKG CRITICAL VALUE     12 lead EKG performed.  Critical value noted.  Newman Nickels, RN notified.   Laurell Josephs, [place] 06/15/2014 7:10 AM

## 2014-06-15 NOTE — Progress Notes (Signed)
PT Cancellation Note  Patient Details Name: Scott [name] MRN: [id number] DOB: April 29, 1946   Cancelled Treatment:    Reason Eval/Treat Not Completed: Other (comment) (refused earlier due to tired after walking to bathroom. This PM amb with cardiac rehab.) Will follow up tomorrow.   Caleb Decock 06/15/2014, 3:23 PM

## 2014-06-15 NOTE — Progress Notes (Signed)
I assessed pts need for foley catheter and asked the pt if we could remove the foley. He stated "I don't want you to remove the foley." I educated the risk for infection with keeping the foley in longer he understood. Will try to remove foley at a later time.

## 2014-06-15 NOTE — Care Management Note (Addendum)
    Page 1 of 2   06/16/2014     5:49:59 PM CARE MANAGEMENT NOTE 06/16/2014  Patient:  Scott [name], Scott [name]   Account Number:  [id number]  Date Initiated:  06/14/2014  Documentation initiated by:  [facility]  Subjective/Objective Assessment:   STEMI     Action/Plan:   CM to follow for disposition needs   Anticipated DC Date:  06/17/2014   Anticipated DC Plan:  [place]  CM consult  Other      Choice offered to / List presented to:     DME arranged  VEST - LIFE VEST      DME agency  OTHER - SEE NOTE        Status of service:  Completed, signed off Medicare Important Message given?  YES (If response is "NO", the following Medicare IM given date fields will be blank) Date Medicare IM given:  06/15/2014 Medicare IM given by:  Elissa Hefty Date Additional Medicare IM given:   Additional Medicare IM given by:    Discharge Disposition:  HOME/SELF CARE  Per UR Regulation:  Reviewed for med. necessity/level of care/duration of stay  If discussed at [place] of Stay Meetings, dates discussed:   06/17/2014    Comments:  Mariann Laster RN, BSN, MSHL, CCM  Nurse - Case Manager,  ([facility])  [phone]  06/16/2014 PT RECS:  none Patient to be fitted for LIFE VEST this pm.   12/15  1533 debbie dowell rn,bsn pt will need lifevest per phy assist. form for zoll filled out. [facility]enn w zoll alerted and will process form. poss dc on thurs 12-17.  06/14/2014 1538 Chart review. UR complete. Jonnie Finner RN CCM Case Mgmt phone [phone]

## 2014-06-15 NOTE — Progress Notes (Signed)
CARDIAC REHAB PHASE I   PRE:  Rate/Rhythm: 98 SR    BP: sitting 103/67    SaO2: 86-89 RA, 92 2L  MODE:  Ambulation: 410 ft   POST:  Rate/Rhythm: 113 ST    BP: sitting 118/72     SaO2: 94 2L  Pt SaO2 low in bed upon arrival, still low sitting on EOB. Better with 2L. Able to walk pushing w/c which seemed to support his ankylosing spondylosis. Actually walked much farther than he initially planned. Return to bed and discussed MI, stent, Plavix, restrictions. Voiced understanding. Generally seems flat and grumpy. [place], [place], ACSM 06/15/2014 3:22 PM

## 2014-06-15 NOTE — Progress Notes (Signed)
Subjective:   68 y/o with h/o CAD, DM2, carotid stenting. Admitted 12/12 with anterior STEMI. S/p PCI LAD. IABP placed.  Developed VT and respiratory failure post cath. Cardioverted and intubated. Amio started   Extubated 06/13/14 . Yesterday IABP removed and levophed was weaned off. He was also switched to po amiodarone and spironolactone. Refused foley removal.   Denies SOB/CP. Feels weak.  Echo with EF 20%.      Intake/Output Summary (Last 24 hours) at 06/15/14 0736 Last data filed at 06/15/14 0600  Gross per 24 hour  Intake 1016.97 ml  Output   1825 ml  Net -808.03 ml    Current meds: . amiodarone  400 mg Oral BID  . aspirin EC  81 mg Oral Daily  . clopidogrel  75 mg Oral Q breakfast  . digoxin  0.125 mg Oral Daily  . heparin subcutaneous  5,000 Units Subcutaneous 3 times per day  . insulin aspart  0-5 Units Subcutaneous QHS  . insulin aspart  0-9 Units Subcutaneous TID WC  . pantoprazole  40 mg Oral Daily  . rosuvastatin  40 mg Oral q1800  . spironolactone  12.5 mg Oral Daily   Infusions: . sodium chloride Stopped (06/14/14 1449)     Objective:  Blood pressure 94/46, pulse 88, temperature 98.5 F (36.9 C), temperature source Oral, resp. rate 27, height 5\' 8"  (1.727 m), weight 218 lb 4.1 oz (99 kg), SpO2 93 %. Weight change:   Physical Exam: General: lying in bed awake  HEENT: normal Neck: supple.  Carotids 2+ bilat; no bruits. No lymphadenopathy or thryomegaly appreciated. Cor: PMI nondisplaced. Regular rate & rhythm. No rubs, gallops or murmurs. Lungs: clear on 2 liters.  Abdomen: soft, nontender, nondistended. No hepatosplenomegaly. No bruits or masses. Good bowel sounds. Extremities: no cyanosis, clubbing, rash, edema  R groin site ok. No hematoma or bruit Neuro: alert & orientedx3, cranial nerves grossly intact. moves all 4 extremities w/o difficulty.  GU: Foley.  Telemetry: SR  Lab Results: Basic Metabolic Panel:  Recent Labs Lab 06/12/14 1243  06/12/14 1841 06/13/14 0428 06/14/14 0430 06/15/14 0340  NA 138 131* 140 140 139  K 4.5 2.9* 4.8 3.7 3.7  CL 104 104 104 103 104  CO2 19 14* 21 25 22   GLUCOSE 211* 175* 204* 142* 109*  BUN 19 14 21 16 19   CREATININE 1.01 0.68 0.99 0.94 0.91  CALCIUM 7.8* 5.7* 8.8 8.8 8.8  MG 1.8  --   --   --   --   PHOS 3.6  --   --   --   --    Liver Function Tests:  Recent Labs Lab 06/12/14 1243 06/13/14 0428 06/14/14 0430 06/15/14 0340  AST 354* 959* 451* 176*  ALT 74* 203* 151* 95*  ALKPHOS 123* 110 104 97  BILITOT 0.4 0.5 0.5 0.6  PROT 7.2 6.6 6.9 6.5  ALBUMIN 3.7 3.3* 3.3* 2.9*   No results for input(s): LIPASE, AMYLASE in the last 168 hours. No results for input(s): AMMONIA in the last 168 hours. CBC:  Recent Labs Lab 06/12/14 1243  06/12/14 2131 06/13/14 0428 06/13/14 1600 06/14/14 0430 06/15/14 0340  WBC 19.6*  < > 22.3* 19.8* 18.0* 20.5* 14.5*  NEUTROABS 18.1*  --  18.8* 16.1*  --   --   --   HGB 13.6  < > 12.7* 12.7* 11.4* 11.7* 11.1*  HCT 40.4  < > 37.8* 37.6* 34.2* 35.5* 33.5*  MCV 94.2  < > 93.3 93.1  93.4 93.9 94.4  PLT 422*  < > 412* 411* 343 334 273  < > = values in this interval not displayed. Cardiac Enzymes:  Recent Labs Lab 06/12/14 1243 06/12/14 1830 06/12/14 2131  TROPONINI >20.00* >20.00* >20.00*   BNP: Invalid input(s): POCBNP CBG:  Recent Labs Lab 06/14/14 0409 06/14/14 0815 06/14/14 1145 06/14/14 1721 06/14/14 1948  GLUCAP 131* 134* 106* 108* 118*   Microbiology: No results found for: CULT No results for input(s): CULT, SDES in the last 168 hours.  Imaging: Dg Abd 1 View  06/13/2014   CLINICAL DATA:  68 year old male with acute abdominal pain and possible GI bleed  EXAM: ABDOMEN - 1 VIEW  COMPARISON:  Prior chest x-ray obtained earlier today  FINDINGS: The bowel gas pattern is not obstructed. No large free air on the supine radiograph. 3 mm radiopacity projecting over the lower pole of the left renal shadow consistent with  nephrolithiasis. Additionally, the gallbladder is dense suggesting multiple small stones or radiopaque sludge. A nasogastric tube is present. The tip is in the gastric antrum. No evidence of large volume ascites. Tubing overlies the right inguinal region. These may represent vascular catheters were tubing external to the patient. Multilevel degenerative disc disease. L2 and L3 compression fractures do not appear acute.  IMPRESSION: 1. Left nephrolithiasis.  3 mm stone overlies the lower pole. 2. Dense gallbladder in the right upper quadrant may reflect underlying sludge and/or small stones. 3. Nasogastric tube within the stomach. The tip is in the gastric antrum. 4. Nonobstructed bowel gas pattern. 5. Chronic appearing L2 and L3 compression fractures. 6. Tubing overlying the right inguinal region may represent femoral vascular catheters or tubing external to the patient.   Electronically Signed   By: Jacqulynn Cadet M.D.   On: 06/13/2014 10:04     ASSESSMENT:  1. Anterior STEMI s/p LAD stent 12/12 2. CAD 3. Acute systolic HF 4. Ventricular tachycardia s/p DC-CV on 12/12 5. Acute VDRF 6. Oral bleeding 7. Shock liver 8. Ankylosing spondylitis  9. H/o transamiitis on lipitor in past  PLAN/DISCUSSION:  Much improved this am. He now has severe LV dysfunction. ECHO EF 20% . Continue digoxin. Start  Ace/BB. Will need to consider Life Vest.   Continue  Sub cutaneous heparin, ASA, statin and Plavix.    Maintaining SR. Continue po amiodarone 400 mg twice a day.   Consult cardiac rehab and dietitian.   Transfer to telemetry.    LOS: 3 days  CLEGG,AMY, NP 06/15/2014, 7:36 AM  Patient seen and examined with Darrick Grinder, NP. We discussed all aspects of the encounter. I agree with the assessment and plan as stated above.   He is much improved this am. Can go to tele. Will start low-dose ACE and b-blocker. Long talk about LifeVest and he wants to proceed. Feels weak. Needs PT and CR. Can stop amio  as VT likely reperfusion rhythm. Continue ASA, Plavix, crestor. Remove Foley.   [place] Alashia [place],MD 9:09 AM

## 2014-06-16 LAB — COMPREHENSIVE METABOLIC PANEL
ALBUMIN: 3 g/dL — AB (ref 3.5–5.2)
ALK PHOS: 126 U/L — AB (ref 39–117)
ALT: 70 U/L — ABNORMAL HIGH (ref 0–53)
ANION GAP: 14 (ref 5–15)
AST: 87 U/L — ABNORMAL HIGH (ref 0–37)
BILIRUBIN TOTAL: 0.6 mg/dL (ref 0.3–1.2)
BUN: 20 mg/dL (ref 6–23)
CHLORIDE: 104 meq/L (ref 96–112)
CO2: 21 meq/L (ref 19–32)
Calcium: 9 mg/dL (ref 8.4–10.5)
Creatinine, Ser: 0.86 mg/dL (ref 0.50–1.35)
GFR calc Af Amer: >90 mL/min (ref >90)
GFR, EST NON AFRICAN AMERICAN: 88 mL/min — AB (ref >90)
Glucose, Bld: 87 mg/dL (ref 70–99)
POTASSIUM: 3.5 meq/L — AB (ref 3.7–5.3)
Sodium: 139 mEq/L (ref 137–147)
Total Protein: 6.9 g/dL (ref 6.0–8.3)

## 2014-06-16 LAB — CBC
HEMATOCRIT: 33.8 % — AB (ref 39.0–52.0)
Hemoglobin: 11.2 g/dL — ABNORMAL LOW (ref 13.0–17.0)
MCH: 31.2 pg (ref 26.0–34.0)
MCHC: 33.1 g/dL (ref 30.0–36.0)
MCV: 94.2 fL (ref 78.0–100.0)
Platelets: 318 10*3/uL (ref 150–400)
RBC: 3.59 MIL/uL — AB (ref 4.22–5.81)
RDW: 13.7 % (ref 11.5–15.5)
WBC: 12.4 10*3/uL — AB (ref 4.0–10.5)

## 2014-06-16 LAB — MAGNESIUM: Magnesium: 2.2 mg/dL (ref 1.5–2.5)

## 2014-06-16 LAB — GLUCOSE, CAPILLARY
GLUCOSE-CAPILLARY: 86 mg/dL (ref 70–99)
Glucose-Capillary: 98 mg/dL (ref 70–99)
Glucose-Capillary: 106 mg/dL — ABNORMAL HIGH (ref 70–99)
Glucose-Capillary: 108 mg/dL — ABNORMAL HIGH (ref 70–99)

## 2014-06-16 MED ORDER — POTASSIUM CHLORIDE CRYS ER 20 MEQ PO TBCR
40.0000 meq | EXTENDED_RELEASE_TABLET | ORAL | Status: AC
  Administered 2014-06-16 (×2): 40 meq via ORAL
  Filled 2014-06-16 (×2): qty 2

## 2014-06-16 MED ORDER — INDOMETHACIN ER 75 MG PO CPCR
75.0000 mg | ORAL_CAPSULE | Freq: Two times a day (BID) | ORAL | Status: DC
  Administered 2014-06-16 – 2014-06-17 (×2): 75 mg via ORAL
  Filled 2014-06-16 (×5): qty 1

## 2014-06-16 NOTE — Progress Notes (Signed)
CARDIAC REHAB PHASE I   PRE:  Rate/Rhythm: 85 SR    BP: sitting 106/64    SaO2: 97 2L, 94 RA  MODE:  Ambulation: 380 ft   POST:  Rate/Rhythm: 108 ST    BP: sitting 114/69     SaO2: 97 Ra  Pt able to walk without RW today, steady. Able to go without O2, SaO2 mid to high 90s. Pt SOB toward end of walk, sts this is new for him. Pt reapplied O2 on return to room. He is upset that he has not been able to sleep (which is a problem PTA). Pt sts I can talk to his wife for education. Began ed process, discussing HF and booklet, daily wts, low sodium, ex gl. Pt was listening and gave feedback occasionally. Wife very attentive. I began discussing NTG but pt became irate when discussing calling 911 instead of having his wife drive him. Instructed me to leave the room. Did not have the opportunity to discuss CRPII or set up lifevest video. [phone]  Josephina Shih [place] CES, ACSM 06/16/2014 11:43 AM

## 2014-06-16 NOTE — Progress Notes (Signed)
Subjective:   68 y/o with h/o CAD, DM2, carotid stenting. Admitted 12/12 with anterior STEMI. S/p PCI LAD. IABP placed.  Developed VT and respiratory failure post cath. Cardioverted and intubated. Amio started   Extubated 06/13/14 . On 06/14/14 IABP removed and levophed was weaned off. Yesterday he started on ARB/BB.   Complains of fatigue. Denies SOB.   Echo with EF 20%.    Intake/Output Summary (Last 24 hours) at 06/16/14 1155 Last data filed at 06/16/14 1000  Gross per 24 hour  Intake    780 ml  Output   1425 ml  Net   -645 ml    Current meds: . amiodarone  400 mg Oral BID  . aspirin EC  81 mg Oral Daily  . carvedilol  3.125 mg Oral BID WC  . clopidogrel  75 mg Oral Q breakfast  . digoxin  0.125 mg Oral Daily  . heparin subcutaneous  5,000 Units Subcutaneous 3 times per day  . insulin aspart  0-5 Units Subcutaneous QHS  . insulin aspart  0-9 Units Subcutaneous TID WC  . losartan  25 mg Oral Daily  . pantoprazole  40 mg Oral Daily  . rosuvastatin  40 mg Oral q1800  . spironolactone  12.5 mg Oral Daily   Infusions: . sodium chloride Stopped (06/14/14 1449)     Objective:  Blood pressure 94/54, pulse 100, temperature 98.2 F (36.8 C), temperature source Oral, resp. rate 17, height 5\' 9"  (1.753 m), weight 212 lb 4.9 oz (96.3 kg), SpO2 95 %. Weight change:   Physical Exam: General: Sitting in chair.   HEENT: normal Neck: supple.  Carotids 2+ bilat; no bruits. No lymphadenopathy or thryomegaly appreciated. Cor: PMI nondisplaced. Regular rate & rhythm. No rubs, gallops or murmurs. Lungs: clear on 2 liters.  Abdomen: soft, nontender, nondistended. No hepatosplenomegaly. No bruits or masses. Good bowel sounds. Extremities: no cyanosis, clubbing, rash, edema  R groin site ok. No hematoma or bruit Neuro: alert & orientedx3, cranial nerves grossly intact. moves all 4 extremities w/o difficulty.    Telemetry: SR 80s  Lab Results: Basic Metabolic Panel:  Recent  Labs Lab 06/12/14 1243 06/12/14 1841 06/13/14 0428 06/14/14 0430 06/15/14 0340 06/16/14 0347  NA 138 131* 140 140 139 139  K 4.5 2.9* 4.8 3.7 3.7 3.5*  CL 104 104 104 103 104 104  CO2 19 14* 21 25 22 21   GLUCOSE 211* 175* 204* 142* 109* 87  BUN 19 14 21 16 19 20   CREATININE 1.01 0.68 0.99 0.94 0.91 0.86  CALCIUM 7.8* 5.7* 8.8 8.8 8.8 9.0  MG 1.8  --   --   --   --  2.2  PHOS 3.6  --   --   --   --   --    Liver Function Tests:  Recent Labs Lab 06/12/14 1243 06/13/14 0428 06/14/14 0430 06/15/14 0340 06/16/14 0347  AST 354* 959* 451* 176* 87*  ALT 74* 203* 151* 95* 70*  ALKPHOS 123* 110 104 97 126*  BILITOT 0.4 0.5 0.5 0.6 0.6  PROT 7.2 6.6 6.9 6.5 6.9  ALBUMIN 3.7 3.3* 3.3* 2.9* 3.0*   No results for input(s): LIPASE, AMYLASE in the last 168 hours. No results for input(s): AMMONIA in the last 168 hours. CBC:  Recent Labs Lab 06/12/14 1243  06/12/14 2131 06/13/14 0428 06/13/14 1600 06/14/14 0430 06/15/14 0340 06/16/14 0347  WBC 19.6*  < > 22.3* 19.8* 18.0* 20.5* 14.5* 12.4*  NEUTROABS 18.1*  --  18.8* 16.1*  --   --   --   --   HGB 13.6  < > 12.7* 12.7* 11.4* 11.7* 11.1* 11.2*  HCT 40.4  < > 37.8* 37.6* 34.2* 35.5* 33.5* 33.8*  MCV 94.2  < > 93.3 93.1 93.4 93.9 94.4 94.2  PLT 422*  < > 412* 411* 343 334 273 318  < > = values in this interval not displayed. Cardiac Enzymes:  Recent Labs Lab 06/12/14 1243 06/12/14 1830 06/12/14 2131  TROPONINI >20.00* >20.00* >20.00*   BNP: Invalid input(s): POCBNP CBG:  Recent Labs Lab 06/15/14 1149 06/15/14 1648 06/15/14 2126 06/16/14 0550 06/16/14 1130  GLUCAP 98 111* 124* 98 106*   Microbiology: No results found for: CULT No results for input(s): CULT, SDES in the last 168 hours.  Imaging: No results found.   ASSESSMENT:  1. Anterior STEMI s/p LAD stent 12/12 2. CAD 3. Acute systolic HF 4. Ventricular tachycardia s/p DC-CV on 12/12 5. Acute VDRF 6. Oral bleeding 7. Shock liver 8. Ankylosing  spondylitis  9. H/o transamiitis on lipitor in past  PLAN/DISCUSSION:  He now has severe LV dysfunction. ECHO EF 20%. Continue digoxin. BP soft continue low dose  Arb/BB. Weight down 2 pounds. On 12.5 mg spironolactone daily. Renal function stable. Supplement K.    Will need LifeVest for d/c and it has been requested.   Continue Subcutaneous heparin, ASA, statin and Plavix.    Maintaining SR. Continue po amiodarone 400 mg twice a day.   Continue cardiac rehab.   Anticipate home tomorrow. Will set up follow up in HF clinic next week.    LOS: 4 days  CLEGG,AMY, NP 06/16/2014, 11:55 AM  Patient seen and examined with Darrick Grinder, NP. We discussed all aspects of the encounter. I agree with the assessment and plan as stated above.   Doing well. On good meds. Volume status looks stable. Continue CR. Place LifeVest. Home in am. With Plavix will switch nexium to Protonix  Karsyn Bensimhon,MD 12:23 PM

## 2014-06-16 NOTE — Evaluation (Signed)
Physical Therapy Evaluation Patient Details Name: Scott [name] MRN: [id number] DOB: 08/26/1945 Today's Date: 06/16/2014   History of Present Illness  68 y/o with h/o CAD, DM2, carotid stenting. Admitted 12/12 with anterior STEMI. S/p PCI LAD. IABP placed.  Developed VT and respiratory failure post cath. Cardioverted and intubated. Extubated 06/13/14 . IABP removed 12/14.  Clinical Impression  Pt doing well with mobility and no further PT needed.  Ready for dc from PT standpoint.       Follow Up Recommendations No PT follow up    Equipment Recommendations  None recommended by PT    Recommendations for Other Services       Precautions / Restrictions Precautions Precautions: None Restrictions Weight Bearing Restrictions: No      Mobility  Bed Mobility                  Transfers Overall transfer level: Independent                  Ambulation/Gait Ambulation/Gait assistance: Modified independent (Device/Increase time) Ambulation Distance (Feet): 600 Feet Assistive device: 4-wheeled walker;None       General Gait Details: Only pushing rollator with one hand. Pt able to amb safely without rollator.  Stairs            Wheelchair Mobility    Modified Rankin (Stroke Patients Only)       Balance Overall balance assessment: No apparent balance deficits (not formally assessed)                                           Pertinent Vitals/Pain Pain Assessment: No/denies pain    Home Living Family/patient expects to be discharged to:: Private residence Living Arrangements: Spouse/significant other Available Help at Discharge: Family Type of Home: House Home Access: Stairs to enter Entrance Stairs-Rails: None Entrance Stairs-Number of Steps: 1-2 Home Layout: Multi-level;Able to live on main level with bedroom/bathroom Home Equipment: None      Prior Function Level of Independence: Independent                Hand Dominance        Extremity/Trunk Assessment   Upper Extremity Assessment: Overall WFL for tasks assessed           Lower Extremity Assessment: Overall WFL for tasks assessed         Communication   Communication: No difficulties  Cognition Arousal/Alertness: Awake/alert Behavior During Therapy: WFL for tasks assessed/performed Overall Cognitive Status: Within Functional Limits for tasks assessed                      General Comments      Exercises        Assessment/Plan    PT Assessment Patent does not need any further PT services  PT Diagnosis Difficulty walking   PT Problem List    PT Treatment Interventions     PT Goals (Current goals can be found in the Care Plan section) Acute Rehab PT Goals PT Goal Formulation: All assessment and education complete, DC therapy    Frequency     Barriers to discharge        Co-evaluation               End of Session Equipment Utilized During Treatment: Oxygen Activity Tolerance: Patient tolerated treatment well Patient left: in chair;with call bell/phone  within reach;with family/visitor present Nurse Communication: Mobility status         Time: 0910-0925 PT Time Calculation (min) (ACUTE ONLY): 15 min   Charges:   PT Evaluation $Initial PT Evaluation Tier I: 1 Procedure     PT G Codes:          [name] 03-Jul-2014, 10:06 AM  [name] PT [phone]

## 2014-06-17 LAB — CBC
HEMATOCRIT: 36 % — AB (ref 39.0–52.0)
HEMOGLOBIN: 11.6 g/dL — AB (ref 13.0–17.0)
MCH: 30.8 pg (ref 26.0–34.0)
MCHC: 32.2 g/dL (ref 30.0–36.0)
MCV: 95.5 fL (ref 78.0–100.0)
Platelets: 326 10*3/uL (ref 150–400)
RBC: 3.77 MIL/uL — AB (ref 4.22–5.81)
RDW: 13.8 % (ref 11.5–15.5)
WBC: 10 10*3/uL (ref 4.0–10.5)

## 2014-06-17 LAB — COMPREHENSIVE METABOLIC PANEL
ALK PHOS: 131 U/L — AB (ref 39–117)
ALT: 66 U/L — AB (ref 0–53)
AST: 58 U/L — ABNORMAL HIGH (ref 0–37)
Albumin: 3 g/dL — ABNORMAL LOW (ref 3.5–5.2)
Anion gap: 13 (ref 5–15)
BILIRUBIN TOTAL: 0.6 mg/dL (ref 0.3–1.2)
BUN: 25 mg/dL — AB (ref 6–23)
CHLORIDE: 106 meq/L (ref 96–112)
CO2: 21 meq/L (ref 19–32)
Calcium: 8.9 mg/dL (ref 8.4–10.5)
Creatinine, Ser: 0.93 mg/dL (ref 0.50–1.35)
GFR calc non Af Amer: 86 mL/min — ABNORMAL LOW (ref >90)
Glucose, Bld: 96 mg/dL (ref 70–99)
POTASSIUM: 4.1 meq/L (ref 3.7–5.3)
Sodium: 140 mEq/L (ref 137–147)
TOTAL PROTEIN: 7 g/dL (ref 6.0–8.3)

## 2014-06-17 LAB — GLUCOSE, CAPILLARY
GLUCOSE-CAPILLARY: 102 mg/dL — AB (ref 70–99)
Glucose-Capillary: 90 mg/dL (ref 70–99)

## 2014-06-17 LAB — DIGOXIN LEVEL: Digoxin Level: 0.4 ng/mL — ABNORMAL LOW (ref 0.8–2.0)

## 2014-06-17 MED ORDER — AMIODARONE HCL 200 MG PO TABS: 200.0000 mg | ORAL_TABLET | Freq: Two times a day (BID) | ORAL | Status: DC

## 2014-06-17 MED ORDER — PANTOPRAZOLE SODIUM 40 MG PO TBEC: 40.0000 mg | DELAYED_RELEASE_TABLET | Freq: Every day | ORAL | Status: DC

## 2014-06-17 MED ORDER — CARVEDILOL 3.125 MG PO TABS: 3.1250 mg | ORAL_TABLET | Freq: Two times a day (BID) | ORAL | Status: DC

## 2014-06-17 MED ORDER — SPIRONOLACTONE 25 MG PO TABS: 12.5000 mg | ORAL_TABLET | Freq: Every day | ORAL | Status: DC

## 2014-06-17 MED ORDER — DIGOXIN 125 MCG PO TABS: 0.1250 mg | ORAL_TABLET | Freq: Every day | ORAL | Status: DC

## 2014-06-17 MED ORDER — LOSARTAN POTASSIUM 25 MG PO TABS: 25.0000 mg | ORAL_TABLET | Freq: Every day | ORAL | Status: DC

## 2014-06-17 MED ORDER — CLOPIDOGREL BISULFATE 75 MG PO TABS: 75.0000 mg | ORAL_TABLET | Freq: Every day | ORAL | Status: DC

## 2014-06-17 NOTE — Progress Notes (Signed)
Subjective:   67 y/o with h/o CAD, DM2, carotid stenting. Admitted 12/12 with anterior STEMI. S/p PCI LAD. IABP placed.  Developed VT and respiratory failure post cath. Cardioverted and intubated. Amio started   Extubated 06/13/14 . On 06/14/14 IABP removed and levophed was weaned off. Transferred to tele and HF meds started.   Stable over night. Denies SOB/Orthopnea/dizziness.    Echo with EF 20%.    Intake/Output Summary (Last 24 hours) at 06/17/14 1245 Last data filed at 06/17/14 1100  Gross per 24 hour  Intake   1320 ml  Output    850 ml  Net    470 ml    Current meds: . amiodarone  400 mg Oral BID  . aspirin EC  81 mg Oral Daily  . carvedilol  3.125 mg Oral BID WC  . clopidogrel  75 mg Oral Q breakfast  . digoxin  0.125 mg Oral Daily  . heparin subcutaneous  5,000 Units Subcutaneous 3 times per day  . indomethacin  75 mg Oral BID WC  . insulin aspart  0-5 Units Subcutaneous QHS  . insulin aspart  0-9 Units Subcutaneous TID WC  . losartan  25 mg Oral Daily  . pantoprazole  40 mg Oral Daily  . rosuvastatin  40 mg Oral q1800  . spironolactone  12.5 mg Oral Daily   Infusions: . sodium chloride Stopped (06/14/14 1449)     Objective:  Blood pressure 124/70, pulse 94, temperature 97.5 F (36.4 C), temperature source Oral, resp. rate 20, height 5\' 9"  (1.753 m), weight 212 lb 4.9 oz (96.3 kg), SpO2 95 %. Weight change: -2 lb 4.8 oz (-1.042 kg)  Physical Exam: General: Sitting in chair.  Wife present.  HEENT: normal Neck: supple.  Carotids 2+ bilat; no bruits. No lymphadenopathy or thryomegaly appreciated. Cor: PMI nondisplaced. Regular rate & rhythm. No rubs, gallops or murmurs. Lungs: clear on 2 liters.  Abdomen: soft, nontender, nondistended. No hepatosplenomegaly. No bruits or masses. Good bowel sounds. Extremities: no cyanosis, clubbing, rash, edema  R groin site ok. No hematoma or bruit Neuro: alert & orientedx3, cranial nerves grossly intact. moves all 4  extremities w/o difficulty.    Telemetry: SR 70s  Lab Results: Basic Metabolic Panel:  Recent Labs Lab 06/12/14 1243  06/13/14 0428 06/14/14 0430 06/15/14 0340 06/16/14 0347 06/17/14 0550  NA 138  < > 140 140 139 139 140  K 4.5  < > 4.8 3.7 3.7 3.5* 4.1  CL 104  < > 104 103 104 104 106  CO2 19  < > 21 25 22 21 21   GLUCOSE 211*  < > 204* 142* 109* 87 96  BUN 19  < > 21 16 19 20  25*  CREATININE 1.01  < > 0.99 0.94 0.91 0.86 0.93  CALCIUM 7.8*  < > 8.8 8.8 8.8 9.0 8.9  MG 1.8  --   --   --   --  2.2  --   PHOS 3.6  --   --   --   --   --   --   < > = values in this interval not displayed. Liver Function Tests:  Recent Labs Lab 06/13/14 0428 06/14/14 0430 06/15/14 0340 06/16/14 0347 06/17/14 0550  AST 959* 451* 176* 87* 58*  ALT 203* 151* 95* 70* 66*  ALKPHOS 110 104 97 126* 131*  BILITOT 0.5 0.5 0.6 0.6 0.6  PROT 6.6 6.9 6.5 6.9 7.0  ALBUMIN 3.3* 3.3* 2.9* 3.0* 3.0*  No results for input(s): LIPASE, AMYLASE in the last 168 hours. No results for input(s): AMMONIA in the last 168 hours. CBC:  Recent Labs Lab 06/12/14 1243  06/12/14 2131 06/13/14 0428 06/13/14 1600 06/14/14 0430 06/15/14 0340 06/16/14 0347 06/17/14 0550  WBC 19.6*  < > 22.3* 19.8* 18.0* 20.5* 14.5* 12.4* 10.0  NEUTROABS 18.1*  --  18.8* 16.1*  --   --   --   --   --   HGB 13.6  < > 12.7* 12.7* 11.4* 11.7* 11.1* 11.2* 11.6*  HCT 40.4  < > 37.8* 37.6* 34.2* 35.5* 33.5* 33.8* 36.0*  MCV 94.2  < > 93.3 93.1 93.4 93.9 94.4 94.2 95.5  PLT 422*  < > 412* 411* 343 334 273 318 326  < > = values in this interval not displayed. Cardiac Enzymes:  Recent Labs Lab 06/12/14 1243 06/12/14 1830 06/12/14 2131  TROPONINI >20.00* >20.00* >20.00*   BNP: Invalid input(s): POCBNP CBG:  Recent Labs Lab 06/16/14 0550 06/16/14 1130 06/16/14 1638 06/16/14 2046 06/17/14 0621  GLUCAP 98 106* 86 108* 102*   Microbiology: No results found for: CULT No results for input(s): CULT, SDES in the last  168 hours.  Imaging: No results found.   ASSESSMENT:  1. Anterior STEMI s/p LAD stent 12/12 2. CAD 3. Acute systolic HF 4. Ventricular tachycardia s/p DC-CV on 12/12 5. Acute VDRF 6. Oral bleeding 7. Shock liver 8. Ankylosing spondylitis  9. H/o transamiitis on lipitor in past  PLAN/DISCUSSION:  He now has severe LV dysfunction. ECHO EF 20%. Continue digoxin. BP soft continue low dose  Arb/BB. Weight unchanged. On 12.5 mg spironolactone daily. Renal function stable.     Will need LifeVest for d/c and it has been requested.   Continue Subcutaneous heparin, ASA, statin and Plavix.    Maintaining SR. Continue po amiodarone 200 mg twice a day.    Has follow up in HF clinic 06/22/14 at 10 am.    LOS: 5 days  CLEGG,AMY, NP 06/17/2014, 12:45 PM  Patient seen and examined with Darrick Grinder, NP. We discussed all aspects of the encounter. I agree with the assessment and plan as stated above.   He is ready for d/c today with LifeVest. Will need f/u in clinic next week. Enroll in CR. Can stop amio at outpatient visit if no ectopy.   Masiah Bensimhon,MD 2:15 PM

## 2014-06-17 NOTE — Progress Notes (Signed)
Discharged patient per MD orders. Reviewed home medication list, upcoming doctors appointments and post MI instructions with patient and his wife. They verbalized understanding. IV and telemetry removed. Assisted patient with putting on life vest. NA to take patient down via wheelchair to discharge area.

## 2014-06-18 NOTE — Discharge Summary (Signed)
Advanced Heart Failure Team  Discharge Summary   Patient ID: Scott [name] MRN: [id number], DOB/AGE: 1946-05-21 68 y.o. Admit date: 06/12/2014 D/C date:     06/18/2014   Primary Discharge Diagnoses:  1. Anterior STEMI s/p LAD stent 12/12 2. CAD 3. Acute systolic HF, ICM  4. Ventricular tachycardia s/p DC-CV on 12/12 5. Acute VDRF 6. Oral bleeding 7. Shock liver 8. Ankylosing spondylitis  9. H/o transamiitis on lipitor in past  Hospital Course:  [name] is 68 y/o with h/o CAD, DM2, carotid stenting. Admitted 06/12/2014 with anterior STEMI and taken to the cath lab emergently with Dr. Gwenlyn Found and underwent  PCI to LAD. In the lab LVEDP was very high and BP was low so IABP placed. He was transferred to the CCU. On arrival to CCU, he developed VT and respiratory failure post cath that required emergent cariovesions and short term intubation.He was placed and amiodarone drip and after he was adequately loaded he was  transitioned to po. At the time of discharge he maintained NSR.   He has severe LV dysfunction on ECHO with EF 20%. He was started on low dose carvedilol, spironolactone, however arb was not added due to soft bp. He was discharged with Life Vest and will continue until he has repeat ECHO. He will continue to be followed closely in the HF clinic and has follow up 06/22/14 at 10 am. He has been referred to cardiac rehab.    Discharge Weight Range:  212 pounds Discharge Vitals: Blood pressure 124/70, pulse 94, temperature 97.5 F (36.4 C), temperature source Oral, resp. rate 20, height 5\' 9"  (1.753 m), weight 212 lb 4.9 oz (96.3 kg), SpO2 95 %.  Labs: Lab Results  Component Value Date   WBC 10.0 06/17/2014   HGB 11.6* 06/17/2014   HCT 36.0* 06/17/2014   MCV 95.5 06/17/2014   PLT 326 06/17/2014    Recent Labs Lab 06/17/14 0550  NA 140  K 4.1  CL 106  CO2 21  BUN 25*  CREATININE 0.93  CALCIUM 8.9  PROT 7.0  BILITOT 0.6  ALKPHOS 131*  ALT 66*  AST 58*   GLUCOSE 96   No results found for: CHOL, HDL, LDLCALC, TRIG BNP (last 3 results) No results for input(s): PROBNP in the last 8760 hours.  Diagnostic Studies/Procedures   No results found.  Discharge Medications     Medication List    STOP taking these medications        esomeprazole 40 MG capsule  Commonly known as:  NEXIUM     hydrALAZINE 25 MG tablet  Commonly known as:  APRESOLINE     isosorbide mononitrate 30 MG 24 hr tablet  Commonly known as:  IMDUR     meclizine 25 MG tablet  Commonly known as:  ANTIVERT      TAKE these medications        amiodarone 200 MG tablet  Commonly known as:  PACERONE  Take 1 tablet (200 mg total) by mouth 2 (two) times daily.     amLODipine 10 MG tablet  Commonly known as:  NORVASC  Take 10 mg by mouth daily.     aspirin EC 81 MG tablet  Take 81 mg by mouth daily.     carvedilol 3.125 MG tablet  Commonly known as:  COREG  Take 1 tablet (3.125 mg total) by mouth 2 (two) times daily with a meal.     clopidogrel 75 MG tablet  Commonly known as:  PLAVIX  Take 1 tablet (75 mg total) by mouth daily with breakfast.     digoxin 0.125 MG tablet  Commonly known as:  LANOXIN  Take 1 tablet (0.125 mg total) by mouth daily.     indomethacin 75 MG CR capsule  Commonly known as:  INDOCIN SR  Take 75 mg by mouth 2 (two) times daily with a meal.     losartan 25 MG tablet  Commonly known as:  COZAAR  Take 1 tablet (25 mg total) by mouth daily.     metaxalone 800 MG tablet  Commonly known as:  SKELAXIN  Take 800 mg by mouth daily.     multivitamin with minerals tablet  Take 1 tablet by mouth daily.     pantoprazole 40 MG tablet  Commonly known as:  PROTONIX  Take 1 tablet (40 mg total) by mouth daily.     rosuvastatin 10 MG tablet  Commonly known as:  CRESTOR  Take 10 mg by mouth daily.     spironolactone 25 MG tablet  Commonly known as:  ALDACTONE  Take 0.5 tablets (12.5 mg total) by mouth daily.        Disposition    The patient will be discharged in stable condition to home.     Discharge Instructions    ACE Inhibitor / ARB already ordered    Complete by:  As directed      Diet - low sodium heart healthy    Complete by:  As directed      Heart Failure patients record your daily weight using the same scale at the same time of day    Complete by:  As directed      Increase activity slowly    Complete by:  As directed           Follow-up Information    Follow up with [name], MD On 06/22/2014.   Specialty:  Cardiology   Why:  10 AM in the Advanced Heart Failure Clinic--Gate code 0006-- Please all medications to appt   Contact information:   [place] [address] [phone]         Duration of Discharge Encounter: Greater than 35 minutes   Signed, CLEGG,AMY NP-C  06/18/2014, 2:21 PM   Patient seen and examined with Darrick Grinder, NP. We discussed all aspects of the encounter. I agree with the assessment and plan as stated above. He is much improved. Will need close f/u in HF Clinic. LifeVest has been placed. Will stop amlodipine in Clinic. If no VT on LifeVest can stop amio as well. Will need echo in 3 months for ICD.   Benay Spice 3:48 PM

## 2014-06-22 ENCOUNTER — Ambulatory Visit (HOSPITAL_COMMUNITY)
Admission: RE | Admit: 2014-06-22 | Discharge: 2014-06-22 | Disposition: A | Discharge status: Home / Self Care | Payer: Medicare Other | Source: Ambulatory Visit | Attending: Internal Medicine | Admitting: Internal Medicine

## 2014-06-22 ENCOUNTER — Encounter (HOSPITAL_COMMUNITY): Payer: Self-pay

## 2014-06-22 VITALS — BP 106/62 | HR 80 | Wt 216.8 lb

## 2014-06-22 DIAGNOSIS — E119 Type 2 diabetes mellitus without complications: Secondary | ICD-10-CM | POA: Diagnosis not present

## 2014-06-22 DIAGNOSIS — I5022 Chronic systolic (congestive) heart failure: Secondary | ICD-10-CM | POA: Insufficient documentation

## 2014-06-22 DIAGNOSIS — Z955 Presence of coronary angioplasty implant and graft: Secondary | ICD-10-CM | POA: Diagnosis not present

## 2014-06-22 DIAGNOSIS — Z7901 Long term (current) use of anticoagulants: Secondary | ICD-10-CM | POA: Diagnosis not present

## 2014-06-22 DIAGNOSIS — Z7982 Long term (current) use of aspirin: Secondary | ICD-10-CM | POA: Insufficient documentation

## 2014-06-22 DIAGNOSIS — Z87442 Personal history of urinary calculi: Secondary | ICD-10-CM | POA: Insufficient documentation

## 2014-06-22 DIAGNOSIS — I2510 Atherosclerotic heart disease of native coronary artery without angina pectoris: Secondary | ICD-10-CM | POA: Insufficient documentation

## 2014-06-22 DIAGNOSIS — E785 Hyperlipidemia, unspecified: Secondary | ICD-10-CM | POA: Diagnosis not present

## 2014-06-22 DIAGNOSIS — N189 Chronic kidney disease, unspecified: Secondary | ICD-10-CM | POA: Diagnosis not present

## 2014-06-22 DIAGNOSIS — I252 Old myocardial infarction: Secondary | ICD-10-CM | POA: Diagnosis not present

## 2014-06-22 DIAGNOSIS — M459 Ankylosing spondylitis of unspecified sites in spine: Secondary | ICD-10-CM | POA: Diagnosis not present

## 2014-06-22 DIAGNOSIS — Z79899 Other long term (current) drug therapy: Secondary | ICD-10-CM | POA: Diagnosis not present

## 2014-06-22 DIAGNOSIS — I129 Hypertensive chronic kidney disease with stage 1 through stage 4 chronic kidney disease, or unspecified chronic kidney disease: Secondary | ICD-10-CM | POA: Insufficient documentation

## 2014-06-22 DIAGNOSIS — I2102 ST elevation (STEMI) myocardial infarction involving left anterior descending coronary artery: Secondary | ICD-10-CM | POA: Diagnosis not present

## 2014-06-22 DIAGNOSIS — Z87891 Personal history of nicotine dependence: Secondary | ICD-10-CM | POA: Diagnosis not present

## 2014-06-22 LAB — BASIC METABOLIC PANEL
ANION GAP: 10 (ref 5–15)
BUN: 23 mg/dL (ref 6–23)
CO2: 20 mmol/L (ref 19–32)
Calcium: 9 mg/dL (ref 8.4–10.5)
Chloride: 109 mEq/L (ref 96–112)
Creatinine, Ser: 1.06 mg/dL (ref 0.50–1.35)
GFR calc non Af Amer: 71 mL/min — ABNORMAL LOW (ref >90)
GFR, EST AFRICAN AMERICAN: 83 mL/min — AB (ref >90)
Glucose, Bld: 99 mg/dL (ref 70–99)
POTASSIUM: 4.4 mmol/L (ref 3.5–5.1)
Sodium: 139 mmol/L (ref 135–145)

## 2014-06-22 LAB — CBC
HEMATOCRIT: 36.2 % — AB (ref 39.0–52.0)
HEMOGLOBIN: 11.8 g/dL — AB (ref 13.0–17.0)
MCH: 31.1 pg (ref 26.0–34.0)
MCHC: 32.6 g/dL (ref 30.0–36.0)
MCV: 95.5 fL (ref 78.0–100.0)
Platelets: 467 10*3/uL — ABNORMAL HIGH (ref 150–400)
RBC: 3.79 MIL/uL — AB (ref 4.22–5.81)
RDW: 14 % (ref 11.5–15.5)
WBC: 11.5 10*3/uL — ABNORMAL HIGH (ref 4.0–10.5)

## 2014-06-22 LAB — DIGOXIN LEVEL: DIGOXIN LVL: 0.4 ng/mL — AB (ref 0.8–2.0)

## 2014-06-22 MED ORDER — CARVEDILOL 6.25 MG PO TABS: 6.2500 mg | ORAL_TABLET | Freq: Two times a day (BID) | ORAL | Status: DC

## 2014-06-22 MED ORDER — AMIODARONE HCL 200 MG PO TABS: 200.0000 mg | ORAL_TABLET | Freq: Every day | ORAL | Status: DC

## 2014-06-22 NOTE — Progress Notes (Signed)
Patient ID: Scott [name], male   DOB: [birth year], 68 y.o.   MRN: [id number] PCP: Primary Cardiologist:  HPI:  68 y/o with h/o CAD with stenting of LCX 2013, DM2, carotid stenting and ankylosing spondylitis.. Admitted 06/1214 with anterior STEMI with occluded LAD. Post-cath developed shock and IABP placed. EF 20%. Then developed VT and respiratory failure.    IABP placed. Developed VT and respiratory failure post cath. Cardioverted and intubated. Amio started   Discharged home 12/17. Weight 212. Feeling tired. Mild DOE. No edema, orthopnea or PND. Wearing LifeVest. Weighing daily. Losing about a pound every day. Weight now 208. No dizziness. Not taking lasix   Labs:   12/17 K+ 4.1 Creatinine 0.93  ROS: All systems negative except as listed in HPI, PMH and Problem List.  SH:  History   Social History  . Marital Status: Married    Spouse Name: N/A    Number of Children: N/A  . Years of Education: N/A   Occupational History  . Not on file.   Social History Main Topics  . Smoking status: Former Smoker    Quit date: 06/12/1973  . Smokeless tobacco: Not on file  . Alcohol Use: No  . Drug Use: No  . Sexual Activity: Not on file   Other Topics Concern  . Not on file   Social History Narrative    FH:  Family History  Problem Relation Age of Onset  . Hypertension Mother     Past Medical History  Diagnosis Date  . Hyperlipidemia   . Hypertension   . Diabetes   . Chronic kidney disease     hx kidney stones  . Spondylitis     Current Outpatient Prescriptions  Medication Sig Dispense Refill  . amiodarone (PACERONE) 200 MG tablet Take 1 tablet (200 mg total) by mouth 2 (two) times daily. 60 tablet 6  . amLODipine (NORVASC) 10 MG tablet Take 10 mg by mouth daily.    Marland Kitchen aspirin EC 81 MG tablet Take 81 mg by mouth daily.    . carvedilol (COREG) 3.125 MG tablet Take 1 tablet (3.125 mg total) by mouth 2 (two) times daily with a meal. 60 tablet 6  . clopidogrel  (PLAVIX) 75 MG tablet Take 1 tablet (75 mg total) by mouth daily with breakfast. 30 tablet 6  . digoxin (LANOXIN) 0.125 MG tablet Take 1 tablet (0.125 mg total) by mouth daily. 30 tablet 6  . indomethacin (INDOCIN SR) 75 MG CR capsule Take 75 mg by mouth 2 (two) times daily with a meal.    . losartan (COZAAR) 25 MG tablet Take 1 tablet (25 mg total) by mouth daily. 30 tablet 6  . metaxalone (SKELAXIN) 800 MG tablet Take 800 mg by mouth daily.    . Multiple Vitamins-Minerals (MULTIVITAMIN WITH MINERALS) tablet Take 1 tablet by mouth daily.    . pantoprazole (PROTONIX) 40 MG tablet Take 1 tablet (40 mg total) by mouth daily. 30 tablet 6  . rosuvastatin (CRESTOR) 10 MG tablet Take 10 mg by mouth daily.    Marland Kitchen spironolactone (ALDACTONE) 25 MG tablet Take 0.5 tablets (12.5 mg total) by mouth daily. 30 tablet 6   No current facility-administered medications for this encounter.    Filed Vitals:   06/22/14 1004  BP: 106/62  Pulse: 80  Weight: 216 lb 12.8 oz (98.34 kg)  SpO2: 94%    PHYSICAL EXAM:  General:  Well appearing. No resp difficulty HEENT: normal Neck: supple. JVP 6. Carotids 2+ bilaterally;  no bruits. No lymphadenopathy or thryomegaly appreciated. Cor: PMI normal. Regular rate & rhythm. No rubs, gallops or murmurs. + lifevest on Lungs: clear Abdomen: soft, nontender, nondistended. No hepatosplenomegaly. No bruits or masses. Good bowel sounds. Extremities: no cyanosis, clubbing, rash, edema Neuro: alert & orientedx3, cranial nerves grossly intact. Moves all 4 extremities w/o difficulty. Affect pleasant.   ASSESSMENT & PLAN: 1. Chronic systolic HF. EF 20% 2. CAD s/p Anterior STEMI on 06/12/14 with stenting of LAD 3. VT in setting of STEMI 4. Ankylosing spondylitis.  Looks very good. Will stop amlodipine. Increase carvedilol to 6.25 bid. Wean amio to 200 daily and stop at next visit if no ectopy on LifeVest. Discussed risk of bleeding on indomethacin, ASA and Plavix. He takes  indomethacin for ankylosing spondylitis. Will f/u with his PCP to see if he can switch off NSAIDs. Check labs today.   Refer to cardiac rehab at [facility]. Will need echo in 3 months to decide about ICD.   If swelling or weight going up. He is to call us.   Jasun Jaidy Cottam,MD 10:54 AM

## 2014-06-22 NOTE — Addendum Note (Signed)
Encounter addended by: Scarlette Calico, RN on: 06/22/2014 11:06 AM<BR>     Documentation filed: Medications, Dx Association, Patient Instructions Section, Orders

## 2014-06-22 NOTE — Patient Instructions (Signed)
Stop Amlodipine  Decrease Amiodarone to 200 mg daily  Increase Carvedilol to 6.25 mg Twice daily, you can take your 3.125 mg tablets 2 tabs Twice daily until you run out, then we have sent you in a new prescription for 6.25 mg tablets  Labs today  You have been referred to Cardiac Rehab at [facility], they will contact you to schedule  Your physician recommends that you schedule a follow-up appointment in: 1 month

## 2014-06-26 ENCOUNTER — Telehealth: Payer: Self-pay | Admitting: Cardiology

## 2014-06-26 ENCOUNTER — Observation Stay: Payer: Self-pay | Admitting: Internal Medicine

## 2014-06-26 DIAGNOSIS — I5023 Acute on chronic systolic (congestive) heart failure: Secondary | ICD-10-CM | POA: Diagnosis not present

## 2014-06-26 DIAGNOSIS — I10 Essential (primary) hypertension: Secondary | ICD-10-CM | POA: Diagnosis not present

## 2014-06-26 DIAGNOSIS — Z955 Presence of coronary angioplasty implant and graft: Secondary | ICD-10-CM | POA: Diagnosis not present

## 2014-06-26 DIAGNOSIS — I952 Hypotension due to drugs: Secondary | ICD-10-CM | POA: Diagnosis not present

## 2014-06-26 DIAGNOSIS — M459 Ankylosing spondylitis of unspecified sites in spine: Secondary | ICD-10-CM | POA: Diagnosis not present

## 2014-06-26 DIAGNOSIS — I959 Hypotension, unspecified: Secondary | ICD-10-CM | POA: Diagnosis not present

## 2014-06-26 DIAGNOSIS — I2510 Atherosclerotic heart disease of native coronary artery without angina pectoris: Secondary | ICD-10-CM | POA: Diagnosis not present

## 2014-06-26 DIAGNOSIS — R748 Abnormal levels of other serum enzymes: Secondary | ICD-10-CM | POA: Diagnosis not present

## 2014-06-26 DIAGNOSIS — I2109 ST elevation (STEMI) myocardial infarction involving other coronary artery of anterior wall: Secondary | ICD-10-CM | POA: Diagnosis not present

## 2014-06-26 DIAGNOSIS — Z7902 Long term (current) use of antithrombotics/antiplatelets: Secondary | ICD-10-CM | POA: Diagnosis not present

## 2014-06-26 DIAGNOSIS — J9811 Atelectasis: Secondary | ICD-10-CM | POA: Diagnosis not present

## 2014-06-26 DIAGNOSIS — Z8249 Family history of ischemic heart disease and other diseases of the circulatory system: Secondary | ICD-10-CM | POA: Diagnosis not present

## 2014-06-26 DIAGNOSIS — E782 Mixed hyperlipidemia: Secondary | ICD-10-CM | POA: Diagnosis not present

## 2014-06-26 DIAGNOSIS — I340 Nonrheumatic mitral (valve) insufficiency: Secondary | ICD-10-CM | POA: Diagnosis not present

## 2014-06-26 DIAGNOSIS — I5021 Acute systolic (congestive) heart failure: Secondary | ICD-10-CM | POA: Diagnosis not present

## 2014-06-26 DIAGNOSIS — Z7982 Long term (current) use of aspirin: Secondary | ICD-10-CM | POA: Diagnosis not present

## 2014-06-26 LAB — COMPREHENSIVE METABOLIC PANEL
ALBUMIN: 3.3 g/dL — AB (ref 3.4–5.0)
ALT: 72 U/L — AB
ANION GAP: 9 (ref 7–16)
Alkaline Phosphatase: 173 U/L — ABNORMAL HIGH
BILIRUBIN TOTAL: 0.4 mg/dL (ref 0.2–1.0)
BUN: 23 mg/dL — AB (ref 7–18)
CALCIUM: 8.4 mg/dL — AB (ref 8.5–10.1)
CHLORIDE: 105 mmol/L (ref 98–107)
CREATININE: 1.24 mg/dL (ref 0.60–1.30)
Co2: 22 mmol/L (ref 21–32)
Glucose: 182 mg/dL — ABNORMAL HIGH (ref 65–99)
OSMOLALITY: 280 (ref 275–301)
POTASSIUM: 3.9 mmol/L (ref 3.5–5.1)
SGOT(AST): 36 U/L (ref 15–37)
Sodium: 136 mmol/L (ref 136–145)
Total Protein: 7.6 g/dL (ref 6.4–8.2)

## 2014-06-26 LAB — CK-MB
CK-MB: 4.2 ng/mL — AB (ref 0.5–3.6)
CK-MB: 4.9 ng/mL — AB (ref 0.5–3.6)
CK-MB: 4.7 ng/mL — AB (ref 0.5–3.6)

## 2014-06-26 LAB — TROPONIN I
TROPONIN-I: 2.1 ng/mL — AB
Troponin-I: 2.5 ng/mL — ABNORMAL HIGH
Troponin-I: 2.3 ng/mL — ABNORMAL HIGH

## 2014-06-26 LAB — CBC
HCT: 38.3 % — AB (ref 40.0–52.0)
HGB: 12.6 g/dL — AB (ref 13.0–18.0)
MCH: 32 pg (ref 26.0–34.0)
MCHC: 32.9 g/dL (ref 32.0–36.0)
MCV: 98 fL (ref 80–100)
Platelet: 563 10*3/uL — ABNORMAL HIGH (ref 150–440)
RBC: 3.93 10*6/uL — ABNORMAL LOW (ref 4.40–5.90)
RDW: 13.9 % (ref 11.5–14.5)
WBC: 14.5 10*3/uL — ABNORMAL HIGH (ref 3.8–10.6)

## 2014-06-26 LAB — URINALYSIS, COMPLETE
BACTERIA: NONE SEEN
BILIRUBIN, UR: NEGATIVE
Blood: NEGATIVE
Glucose,UR: 50 mg/dL (ref 0–75)
Nitrite: NEGATIVE
Ph: 5 (ref 4.5–8.0)
Protein: 30
RBC,UR: 11 /HPF (ref 0–5)
SPECIFIC GRAVITY: 1.024 (ref 1.003–1.030)
Squamous Epithelial: NONE SEEN
WBC UR: 9 /HPF (ref 0–5)

## 2014-06-26 LAB — CK TOTAL AND CKMB (NOT AT ARMC)
CK, Total: 78 U/L (ref 39–308)
CK-MB: 3.3 ng/mL (ref 0.5–3.6)

## 2014-06-26 LAB — DIGOXIN LEVEL: Digoxin: 1.2 ng/mL

## 2014-06-26 NOTE — Telephone Encounter (Signed)
Pt's wife called to inform us that his BP had become low with increased dose of coreg.  He became so weak he could hardly walk around.  They have decreased to 3.125 BID.  Today his BP is 86/40 he has taken coreg but I have asked him to hold losartan for today only.  Resume tomorrow unless his BP is low.  His HR is 62 on BP check.    He also has trouble taking a breath with lying down which they attribute to the low BP.  I asked them to call back if this continues despite better BP.  Otherwise they will call DR Bensimhon's  Clinic on Monday.

## 2014-06-27 DIAGNOSIS — R531 Weakness: Secondary | ICD-10-CM | POA: Diagnosis not present

## 2014-06-27 DIAGNOSIS — R42 Dizziness and giddiness: Secondary | ICD-10-CM | POA: Diagnosis not present

## 2014-06-27 DIAGNOSIS — I959 Hypotension, unspecified: Secondary | ICD-10-CM | POA: Diagnosis not present

## 2014-06-27 DIAGNOSIS — I213 ST elevation (STEMI) myocardial infarction of unspecified site: Secondary | ICD-10-CM | POA: Diagnosis not present

## 2014-06-27 LAB — BASIC METABOLIC PANEL
Anion Gap: 6 — ABNORMAL LOW (ref 7–16)
BUN: 19 mg/dL — AB (ref 7–18)
CHLORIDE: 105 mmol/L (ref 98–107)
Calcium, Total: 8.4 mg/dL — ABNORMAL LOW (ref 8.5–10.1)
Co2: 27 mmol/L (ref 21–32)
Creatinine: 1.2 mg/dL (ref 0.60–1.30)
EGFR (Non-African Amer.): >60
GLUCOSE: 96 mg/dL (ref 65–99)
Osmolality: 278 (ref 275–301)
Potassium: 4 mmol/L (ref 3.5–5.1)
Sodium: 138 mmol/L (ref 136–145)

## 2014-06-27 LAB — CBC WITH DIFFERENTIAL/PLATELET
BASOS ABS: 0.1 10*3/uL (ref 0.0–0.1)
BASOS PCT: 0.4 %
EOS ABS: 0.1 10*3/uL (ref 0.0–0.7)
Eosinophil %: 0.8 %
HCT: 36.1 % — ABNORMAL LOW (ref 40.0–52.0)
HGB: 11.8 g/dL — ABNORMAL LOW (ref 13.0–18.0)
LYMPHS PCT: 12.4 %
Lymphocyte #: 1.5 10*3/uL (ref 1.0–3.6)
MCH: 31.9 pg (ref 26.0–34.0)
MCHC: 32.6 g/dL (ref 32.0–36.0)
MCV: 98 fL (ref 80–100)
Monocyte #: 1.1 x10 3/mm — ABNORMAL HIGH (ref 0.2–1.0)
Monocyte %: 9.1 %
Neutrophil #: 9.3 10*3/uL — ABNORMAL HIGH (ref 1.4–6.5)
Neutrophil %: 77.3 %
PLATELETS: 538 10*3/uL — AB (ref 150–440)
RBC: 3.69 10*6/uL — ABNORMAL LOW (ref 4.40–5.90)
RDW: 14 % (ref 11.5–14.5)
WBC: 12 10*3/uL — ABNORMAL HIGH (ref 3.8–10.6)

## 2014-06-28 ENCOUNTER — Other Ambulatory Visit (HOSPITAL_COMMUNITY): Payer: Self-pay

## 2014-06-28 DIAGNOSIS — I213 ST elevation (STEMI) myocardial infarction of unspecified site: Secondary | ICD-10-CM | POA: Diagnosis not present

## 2014-06-28 DIAGNOSIS — R42 Dizziness and giddiness: Secondary | ICD-10-CM | POA: Diagnosis not present

## 2014-06-28 DIAGNOSIS — I959 Hypotension, unspecified: Secondary | ICD-10-CM | POA: Diagnosis not present

## 2014-06-28 DIAGNOSIS — I2109 ST elevation (STEMI) myocardial infarction involving other coronary artery of anterior wall: Secondary | ICD-10-CM | POA: Diagnosis not present

## 2014-06-28 DIAGNOSIS — R531 Weakness: Secondary | ICD-10-CM | POA: Diagnosis not present

## 2014-06-28 DIAGNOSIS — I10 Essential (primary) hypertension: Secondary | ICD-10-CM | POA: Diagnosis not present

## 2014-06-28 MED ORDER — CARVEDILOL 3.125 MG PO TABS: 3.1250 mg | ORAL_TABLET | Freq: Two times a day (BID) | ORAL | Status: DC

## 2014-06-29 DIAGNOSIS — I2109 ST elevation (STEMI) myocardial infarction involving other coronary artery of anterior wall: Secondary | ICD-10-CM | POA: Diagnosis not present

## 2014-06-29 DIAGNOSIS — I5023 Acute on chronic systolic (congestive) heart failure: Secondary | ICD-10-CM | POA: Diagnosis not present

## 2014-06-29 DIAGNOSIS — I429 Cardiomyopathy, unspecified: Secondary | ICD-10-CM | POA: Diagnosis not present

## 2014-06-29 DIAGNOSIS — I2510 Atherosclerotic heart disease of native coronary artery without angina pectoris: Secondary | ICD-10-CM | POA: Diagnosis not present

## 2014-07-08 DIAGNOSIS — I714 Abdominal aortic aneurysm, without rupture: Secondary | ICD-10-CM | POA: Diagnosis not present

## 2014-07-08 DIAGNOSIS — M459 Ankylosing spondylitis of unspecified sites in spine: Secondary | ICD-10-CM | POA: Diagnosis not present

## 2014-07-20 ENCOUNTER — Encounter (HOSPITAL_COMMUNITY): Payer: Self-pay

## 2014-07-20 ENCOUNTER — Telehealth (HOSPITAL_COMMUNITY): Payer: Self-pay | Admitting: Vascular Surgery

## 2014-07-20 ENCOUNTER — Ambulatory Visit (HOSPITAL_COMMUNITY)
Admission: RE | Admit: 2014-07-20 | Discharge: 2014-07-20 | Disposition: A | Discharge status: Home / Self Care | Payer: Medicare Other | Source: Ambulatory Visit | Attending: Internal Medicine | Admitting: Internal Medicine

## 2014-07-20 VITALS — BP 92/58 | HR 74 | Wt 203.8 lb

## 2014-07-20 DIAGNOSIS — I252 Old myocardial infarction: Secondary | ICD-10-CM | POA: Diagnosis not present

## 2014-07-20 DIAGNOSIS — Z7902 Long term (current) use of antithrombotics/antiplatelets: Secondary | ICD-10-CM | POA: Insufficient documentation

## 2014-07-20 DIAGNOSIS — I959 Hypotension, unspecified: Secondary | ICD-10-CM | POA: Diagnosis not present

## 2014-07-20 DIAGNOSIS — Z87891 Personal history of nicotine dependence: Secondary | ICD-10-CM | POA: Diagnosis not present

## 2014-07-20 DIAGNOSIS — Z79899 Other long term (current) drug therapy: Secondary | ICD-10-CM | POA: Insufficient documentation

## 2014-07-20 DIAGNOSIS — E785 Hyperlipidemia, unspecified: Secondary | ICD-10-CM | POA: Insufficient documentation

## 2014-07-20 DIAGNOSIS — I129 Hypertensive chronic kidney disease with stage 1 through stage 4 chronic kidney disease, or unspecified chronic kidney disease: Secondary | ICD-10-CM | POA: Diagnosis not present

## 2014-07-20 DIAGNOSIS — M459 Ankylosing spondylitis of unspecified sites in spine: Secondary | ICD-10-CM | POA: Diagnosis not present

## 2014-07-20 DIAGNOSIS — G4700 Insomnia, unspecified: Secondary | ICD-10-CM | POA: Diagnosis not present

## 2014-07-20 DIAGNOSIS — Z7982 Long term (current) use of aspirin: Secondary | ICD-10-CM | POA: Insufficient documentation

## 2014-07-20 DIAGNOSIS — I2510 Atherosclerotic heart disease of native coronary artery without angina pectoris: Secondary | ICD-10-CM | POA: Insufficient documentation

## 2014-07-20 DIAGNOSIS — I5022 Chronic systolic (congestive) heart failure: Secondary | ICD-10-CM | POA: Insufficient documentation

## 2014-07-20 DIAGNOSIS — N189 Chronic kidney disease, unspecified: Secondary | ICD-10-CM | POA: Diagnosis not present

## 2014-07-20 DIAGNOSIS — E119 Type 2 diabetes mellitus without complications: Secondary | ICD-10-CM | POA: Diagnosis not present

## 2014-07-20 DIAGNOSIS — R0600 Dyspnea, unspecified: Secondary | ICD-10-CM

## 2014-07-20 LAB — BASIC METABOLIC PANEL
ANION GAP: 6 (ref 5–15)
BUN: 10 mg/dL (ref 6–23)
CALCIUM: 9.5 mg/dL (ref 8.4–10.5)
CO2: 25 mmol/L (ref 19–32)
Chloride: 106 mEq/L (ref 96–112)
Creatinine, Ser: 1.23 mg/dL (ref 0.50–1.35)
GFR, EST AFRICAN AMERICAN: 69 mL/min — AB (ref >90)
GFR, EST NON AFRICAN AMERICAN: 59 mL/min — AB (ref >90)
GLUCOSE: 149 mg/dL — AB (ref 70–99)
POTASSIUM: 4.4 mmol/L (ref 3.5–5.1)
SODIUM: 137 mmol/L (ref 135–145)

## 2014-07-20 LAB — BRAIN NATRIURETIC PEPTIDE: B Natriuretic Peptide: 773.6 pg/mL — ABNORMAL HIGH (ref 0.0–100.0)

## 2014-07-20 LAB — DIGOXIN LEVEL: Digoxin Level: 0.5 ng/mL — ABNORMAL LOW (ref 0.8–2.0)

## 2014-07-20 MED ORDER — ZOLPIDEM TARTRATE 10 MG PO TABS: 10.0000 mg | ORAL_TABLET | Freq: Every evening | ORAL | Status: DC | PRN

## 2014-07-20 MED ORDER — DIGOXIN 125 MCG PO TABS: 0.1250 mg | ORAL_TABLET | Freq: Every day | ORAL | Status: DC

## 2014-07-20 MED ORDER — CLOPIDOGREL BISULFATE 75 MG PO TABS: 75.0000 mg | ORAL_TABLET | Freq: Every day | ORAL | Status: DC

## 2014-07-20 MED ORDER — CARVEDILOL 6.25 MG PO TABS: 3.1250 mg | ORAL_TABLET | Freq: Two times a day (BID) | ORAL | Status: DC

## 2014-07-20 MED ORDER — PANTOPRAZOLE SODIUM 40 MG PO TBEC: 40.0000 mg | DELAYED_RELEASE_TABLET | Freq: Every day | ORAL | Status: DC

## 2014-07-20 NOTE — Addendum Note (Signed)
Encounter addended by: Scarlette Calico, RN on: 07/20/2014 10:54 AM<BR>     Documentation filed: Visit Diagnoses, Dx Association, Patient Instructions Section, Orders

## 2014-07-20 NOTE — Telephone Encounter (Signed)
PT wife called they were here this morning.. Wife states [name] told pt to stop taking the Amiodirone but the medication is still on medication list wife needs to clarify.. Please advise

## 2014-07-20 NOTE — Progress Notes (Signed)
Patient ID: Scott [name], male   DOB: 03/16/1946, 69 y.o.   MRN: [id number] PCP: Primary Cardiologist:  HPI:  69 y/o with h/o CAD with stenting of LCX 2013, DM2, carotid stenting and ankylosing spondylitis.. Admitted 06/1214 with anterior STEMI with occluded LAD. Post-cath developed shock and IABP placed. EF 20%. Then developed VT and respiratory failure.    IABP placed. Developed VT and respiratory failure post cath. Cardioverted and intubated. Amio started   Discharged home 12/17. Weight 212.   Follow-up: At last visit on 12/22 we increased carvedilol to 6.25 bid but he was unable to tolerate due to severe fatigue and hypotension with BPs in 80s. Has cut back to 3.125 bid. Was admitted to [facility] and spironolactone stopped. Also saw Dr. Clayborn Bigness on 12/29. Losartan eventually cut in half to 12.5 qhs. Taking BP several times per day. SBP running 84-104 (mostly in low 90s). Feeling tired/lousy. Trying to walk 15 mins bid. No CP.  No edema, orthopnea or PND. Wearing LifeVest. Not sleeping well. Denies PND/orthopnea. Has tried melatonin with no help. Weight down 210-> 197. Poor appetite.   Labs:   12/17 K+ 4.1 Creatinine 0.93 12/28 K+ 4.0 Creatinine 1.20   ROS: All systems negative except as listed in HPI, PMH and Problem List.  SH:  History   Social History  . Marital Status: Married    Spouse Name: N/A    Number of Children: N/A  . Years of Education: N/A   Occupational History  . Not on file.   Social History Main Topics  . Smoking status: Former Smoker    Quit date: 06/12/1973  . Smokeless tobacco: Not on file  . Alcohol Use: No  . Drug Use: No  . Sexual Activity: Not on file   Other Topics Concern  . Not on file   Social History Narrative    FH:  Family History  Problem Relation Age of Onset  . Hypertension Mother     Past Medical History  Diagnosis Date  . Hyperlipidemia   . Hypertension   . Diabetes   . Chronic kidney disease     hx kidney stones  .  Spondylitis     Current Outpatient Prescriptions  Medication Sig Dispense Refill  . amiodarone (PACERONE) 200 MG tablet Take 1 tablet (200 mg total) by mouth daily. 60 tablet 6  . aspirin EC 81 MG tablet Take 81 mg by mouth daily.    . carvedilol (COREG) 3.125 MG tablet Take 1 tablet (3.125 mg total) by mouth 2 (two) times daily with a meal. (Patient taking differently: Take 6.25 mg by mouth 2 (two) times daily with a meal. ) 60 tablet 3  . clopidogrel (PLAVIX) 75 MG tablet Take 1 tablet (75 mg total) by mouth daily with breakfast. 30 tablet 6  . digoxin (LANOXIN) 0.125 MG tablet Take 1 tablet (0.125 mg total) by mouth daily. 30 tablet 6  . losartan (COZAAR) 25 MG tablet Take 1 tablet (25 mg total) by mouth daily. 30 tablet 6  . metaxalone (SKELAXIN) 800 MG tablet Take 800 mg by mouth daily.    . Multiple Vitamins-Minerals (MULTIVITAMIN WITH MINERALS) tablet Take 1 tablet by mouth daily.    . pantoprazole (PROTONIX) 40 MG tablet Take 1 tablet (40 mg total) by mouth daily. 30 tablet 6  . rosuvastatin (CRESTOR) 10 MG tablet Take 10 mg by mouth daily.    Marland Kitchen sulfaSALAzine (AZULFIDINE) 500 MG tablet Take 500 mg by mouth 2 (two) times daily.  No current facility-administered medications for this encounter.    Filed Vitals:   07/20/14 0948  BP: 92/58  Pulse: 74  Weight: 203 lb 12.8 oz (92.443 kg)  SpO2: 96%    PHYSICAL EXAM:  General:  Fatigued appearing. No resp difficulty HEENT: normal Neck: supple. JVP flat. Carotids 2+ bilaterally; no bruits. No lymphadenopathy or thryomegaly appreciated. Cor: PMI normal. Regular rate & rhythm. No rubs, gallops or murmurs. + lifevest on Lungs: clear Abdomen: soft, nontender, nondistended. No hepatosplenomegaly. No bruits or masses. Good bowel sounds. Extremities: no cyanosis, clubbing, rash, edema Neuro: alert & orientedx3, cranial nerves grossly intact. Moves all 4 extremities w/o difficulty. Affect pleasant.   ASSESSMENT & PLAN: 1. Chronic  systolic HF. EF 20% 2. CAD s/p Anterior STEMI on 06/12/14 with stenting of LAD 3. VT in setting of STEMI 4. Ankylosing spondylitis. 5. Insomnia  Currently struggling with low BP and NYHA III symptoms. He has been unable to tolerate and titration of his medicines and in fact is having to cut back due to low BP. I have concern over possible low output state. We will proceed with CPX testing to further evaluate. Volume status looks of of lasix. For now will continue carvedilol 3.125 bid and losartan 12.5 qhs. He is to hold losartan if SBP < 90. Can stop amiodarone. Will need repeat echo 1-2 months. Try ambien to help with sleep. Continue statin and Plavix. Corlanor may be an option.  Check BMET, BNP, digoxin  Total time spent 35 minutes. Over half that time spent discussing above.   Sevin Bensimhon,MD 10:25 AM

## 2014-07-20 NOTE — Patient Instructions (Signed)
We have given you a prescription for Ambien to help you sleep  Labs today  Your physician has recommended that you have a cardiopulmonary stress test (CPX). CPX testing is a non-invasive measurement of heart and lung function. It replaces a traditional treadmill stress test. This type of test provides a tremendous amount of information that relates not only to your present condition but also for future outcomes. This test combines measurements of you ventilation, respiratory gas exchange in the lungs, electrocardiogram (EKG), blood pressure and physical response before, during, and following an exercise protocol.  Your physician recommends that you schedule a follow-up appointment in: 3-4 weeks

## 2014-07-20 NOTE — Telephone Encounter (Signed)
Per Dr Haroldine Laws pt is suppose to d/c Amio, pt's wife is aware and agreeable, med removed from med list, [facility]

## 2014-08-06 ENCOUNTER — Telehealth (HOSPITAL_COMMUNITY): Payer: Self-pay | Admitting: Vascular Surgery

## 2014-08-06 NOTE — Telephone Encounter (Signed)
Pt wife called his pulse rate was 105 on Tues it been running high all week in the mid to high 90s.. Please advise

## 2014-08-06 NOTE — Telephone Encounter (Signed)
Pt wife is concerned about his pulse rate on Tues it was 105 its been running high all week please advise

## 2014-08-06 NOTE — Telephone Encounter (Signed)
Patient was recently DC'd off of amiodarone 2 weeks ago, will forward to MD for review and further instruction.

## 2014-08-08 NOTE — Telephone Encounter (Signed)
Lets bring in for an ECG.

## 2014-08-09 ENCOUNTER — Ambulatory Visit (HOSPITAL_COMMUNITY)
Admission: RE | Admit: 2014-08-09 | Discharge: 2014-08-09 | Disposition: A | Discharge status: Home / Self Care | Payer: Medicare Other | Source: Ambulatory Visit | Attending: Internal Medicine | Admitting: Internal Medicine

## 2014-08-09 DIAGNOSIS — R9431 Abnormal electrocardiogram [ECG] [EKG]: Secondary | ICD-10-CM | POA: Diagnosis not present

## 2014-08-09 NOTE — Telephone Encounter (Signed)
Pt aware.

## 2014-08-17 ENCOUNTER — Ambulatory Visit (HOSPITAL_COMMUNITY)
Admission: RE | Admit: 2014-08-17 | Discharge: 2014-08-17 | Disposition: A | Discharge status: Home / Self Care | Payer: Medicare Other | Source: Ambulatory Visit | Attending: Internal Medicine | Admitting: Internal Medicine

## 2014-08-17 ENCOUNTER — Encounter (HOSPITAL_COMMUNITY): Payer: Self-pay

## 2014-08-17 DIAGNOSIS — Z7982 Long term (current) use of aspirin: Secondary | ICD-10-CM | POA: Diagnosis not present

## 2014-08-17 DIAGNOSIS — Z79899 Other long term (current) drug therapy: Secondary | ICD-10-CM | POA: Diagnosis not present

## 2014-08-17 DIAGNOSIS — N189 Chronic kidney disease, unspecified: Secondary | ICD-10-CM | POA: Diagnosis not present

## 2014-08-17 DIAGNOSIS — I129 Hypertensive chronic kidney disease with stage 1 through stage 4 chronic kidney disease, or unspecified chronic kidney disease: Secondary | ICD-10-CM | POA: Diagnosis not present

## 2014-08-17 DIAGNOSIS — Z7902 Long term (current) use of antithrombotics/antiplatelets: Secondary | ICD-10-CM | POA: Diagnosis not present

## 2014-08-17 DIAGNOSIS — I2510 Atherosclerotic heart disease of native coronary artery without angina pectoris: Secondary | ICD-10-CM | POA: Diagnosis not present

## 2014-08-17 DIAGNOSIS — M459 Ankylosing spondylitis of unspecified sites in spine: Secondary | ICD-10-CM | POA: Insufficient documentation

## 2014-08-17 DIAGNOSIS — E785 Hyperlipidemia, unspecified: Secondary | ICD-10-CM | POA: Insufficient documentation

## 2014-08-17 DIAGNOSIS — I5022 Chronic systolic (congestive) heart failure: Secondary | ICD-10-CM | POA: Diagnosis not present

## 2014-08-17 DIAGNOSIS — I252 Old myocardial infarction: Secondary | ICD-10-CM | POA: Diagnosis not present

## 2014-08-17 DIAGNOSIS — E119 Type 2 diabetes mellitus without complications: Secondary | ICD-10-CM | POA: Insufficient documentation

## 2014-08-17 DIAGNOSIS — Z87891 Personal history of nicotine dependence: Secondary | ICD-10-CM | POA: Insufficient documentation

## 2014-08-17 LAB — BASIC METABOLIC PANEL
ANION GAP: 7 (ref 5–15)
BUN: 17 mg/dL (ref 6–23)
CO2: 25 mmol/L (ref 19–32)
Calcium: 9.1 mg/dL (ref 8.4–10.5)
Chloride: 109 mmol/L (ref 96–112)
Creatinine, Ser: 1.25 mg/dL (ref 0.50–1.35)
GFR calc Af Amer: 68 mL/min — ABNORMAL LOW (ref >90)
GFR calc non Af Amer: 58 mL/min — ABNORMAL LOW (ref >90)
Glucose, Bld: 116 mg/dL — ABNORMAL HIGH (ref 70–99)
POTASSIUM: 4.6 mmol/L (ref 3.5–5.1)
SODIUM: 141 mmol/L (ref 135–145)

## 2014-08-17 MED ORDER — IVABRADINE HCL 5 MG PO TABS: 5.0000 mg | ORAL_TABLET | Freq: Two times a day (BID) | ORAL | Status: DC

## 2014-08-17 MED ORDER — LOSARTAN POTASSIUM 25 MG PO TABS: 12.5000 mg | ORAL_TABLET | Freq: Every day | ORAL | Status: DC

## 2014-08-17 NOTE — Progress Notes (Signed)
Patient ID: Scott [name], male   DOB: 1945-10-12, 69 y.o.   MRN: [id number] PCP: Primary Cardiologist:  HPI:  69 y/o with h/o CAD with stenting of LCX 2013, DM2, carotid stenting and ankylosing spondylitis.. Admitted 06/1214 with anterior STEMI with occluded LAD. Post-cath developed shock and IABP placed. EF 20%. Then developed VT and respiratory failure.    IABP placed. Developed VT and respiratory failure post cath. Cardioverted and intubated. Amio started   Discharged home 12/17. Weight 212.   Previously we increased carvedilol to 6.25 bid but he was unable to tolerate due to severe fatigue and hypotension with BPs in 80s. Has cut back to 3.125 bid.  Follow-up: Says he was doing well until Feb 2 when HR jumped from the 60s to 65-105. Had ECG on Feb 8th which showed NSR at 94. Was having more pain in sternum from ankylosing spondylosis. Sulfasalazine not helping. Started back on 1 indomethacin pill again and HR came back to 70-80s. Stopped indomethacin and HR back up. Continues to wear LifeVest. No firings. SBP. SBP running 84-104 (mostly in low 90s). Holds losartan if BP < 90. No CP.  No edema, orthopnea or PND. Has good days and bad days. When he feels good can walk 15 mins in am and 15 mins in pm. Does that about 3x/week. Weight stable 194-196. Is pending CPX testing. Has not heard from [facility].   Labs:   12/17 K+ 4.1 Creatinine 0.93 12/28 K+ 4.0 Creatinine 1.20   ROS: All systems negative except as listed in HPI, PMH and Problem List.  SH:  History   Social History  . Marital Status: Married    Spouse Name: N/A  . Number of Children: N/A  . Years of Education: N/A   Occupational History  . Not on file.   Social History Main Topics  . Smoking status: Former Smoker    Quit date: 06/12/1973  . Smokeless tobacco: Not on file  . Alcohol Use: No  . Drug Use: No  . Sexual Activity: Not on file   Other Topics Concern  . Not on file   Social History Narrative     FH:  Family History  Problem Relation Age of Onset  . Hypertension Mother     Past Medical History  Diagnosis Date  . Hyperlipidemia   . Hypertension   . Diabetes   . Chronic kidney disease     hx kidney stones  . Spondylitis     Current Outpatient Prescriptions  Medication Sig Dispense Refill  . aspirin EC 81 MG tablet Take 81 mg by mouth daily.    . carvedilol (COREG) 6.25 MG tablet Take 0.5 tablets (3.125 mg total) by mouth 2 (two) times daily. 180 tablet 3  . clopidogrel (PLAVIX) 75 MG tablet Take 1 tablet (75 mg total) by mouth daily with breakfast. 90 tablet 3  . digoxin (LANOXIN) 0.125 MG tablet Take 1 tablet (0.125 mg total) by mouth daily. 90 tablet 3  . losartan (COZAAR) 25 MG tablet Take 1 tablet (25 mg total) by mouth daily. (Patient taking differently: Take 12.5 mg by mouth daily. Only takes when BP is higher then 90 SBP) 30 tablet 6  . metaxalone (SKELAXIN) 800 MG tablet Take 800 mg by mouth daily. As needed    . Multiple Vitamins-Minerals (MULTIVITAMIN WITH MINERALS) tablet Take 1 tablet by mouth daily.    . pantoprazole (PROTONIX) 40 MG tablet Take 1 tablet (40 mg total) by mouth daily. 90 tablet 3  .  rosuvastatin (CRESTOR) 10 MG tablet Take 10 mg by mouth daily.    Marland Kitchen sulfaSALAzine (AZULFIDINE) 500 MG tablet Take 500 mg by mouth 2 (two) times daily.    Marland Kitchen zolpidem (AMBIEN) 10 MG tablet Take 1 tablet (10 mg total) by mouth at bedtime as needed for sleep. 20 tablet 3   No current facility-administered medications for this encounter.    Filed Vitals:   08/17/14 1017  BP: 98/72  Pulse: 104  Weight: 200 lb 12.8 oz (91.082 kg)  SpO2: 93%    PHYSICAL EXAM:  General:  Well appearing. No resp difficulty HEENT: normal Neck: supple. JVP flat. Carotids 2+ bilaterally; no bruits. No lymphadenopathy or thryomegaly appreciated. Cor: PMI normal. Regular rate & rhythm. No rubs, gallops or murmurs. + lifevest on Lungs: clear Abdomen: soft, nontender, nondistended.  No hepatosplenomegaly. No bruits or masses. Good bowel sounds. Extremities: no cyanosis, clubbing, rash, edema Neuro: alert & orientedx3, cranial nerves grossly intact. Moves all 4 extremities w/o difficulty. Affect pleasant.   ASSESSMENT & PLAN: 1. Chronic systolic HF. EF 20% 2. CAD s/p Anterior STEMI on 06/12/14 with stenting of LAD 3. VT in setting of STEMI 4. Ankylosing spondylitis.  Improving slowly but still struggling with low BP and NYHA III symptoms. He has been unable to tolerate and titration of his medicines and in fact is having to cut back due to low BP.  Volume status looks of off lasix. We will proceed with CPX testing to further evaluate.For now will continue carvedilol 3.125 bid and losartan 12.5 qhs. He is to hold losartan if SBP < 90. Continue statin and Plavix.  Suspect HR is up due to pain and we have discussed CV risks benefits of indomethacin and he will try to cut back to 50mg  and use as needed. Will start corlanor 5 bid.   Will repeat echo at next visit. If EF <= 35% will need referral for ICD.   Check BMET.  Total time spent 40 minutes. Over half that time spent discussing above with him and his wife.   Merdith Bensimhon,MD 10:30 AM

## 2014-08-17 NOTE — Patient Instructions (Signed)
Start Corlanor 5 mg Twice daily   Lab today  Your physician has recommended that you have a cardiopulmonary stress test (CPX). CPX testing is a non-invasive measurement of heart and lung function. It replaces a traditional treadmill stress test. This type of test provides a tremendous amount of information that relates not only to your present condition but also for future outcomes. This test combines measurements of you ventilation, respiratory gas exchange in the lungs, electrocardiogram (EKG), blood pressure and physical response before, during, and following an exercise protocol.  Your physician recommends that you schedule a follow-up appointment in: 1 month with echocardiogram

## 2014-08-19 ENCOUNTER — Telehealth (HOSPITAL_COMMUNITY): Payer: Self-pay | Admitting: Vascular Surgery

## 2014-08-19 DIAGNOSIS — G479 Sleep disorder, unspecified: Secondary | ICD-10-CM | POA: Diagnosis not present

## 2014-08-19 NOTE — Telephone Encounter (Signed)
Pt wife called Dr. Haroldine Laws put pt on a small dosage indindomethacin  , pt rhemo doctor does not want him to take this medicaine .Marland Kitchen Please advise

## 2014-08-20 ENCOUNTER — Telehealth (HOSPITAL_COMMUNITY): Payer: Self-pay | Admitting: *Deleted

## 2014-08-20 NOTE — Telephone Encounter (Signed)
Obtained PA for pt's Corlanor, med was approved 07/20/14-08/19/15

## 2014-08-20 NOTE — Telephone Encounter (Signed)
Per Dr Haroldine Laws rheumatologist should handle, spoke w/pt's wife she states pt was on sulfasalazine but they had to stop it this week due to pt unable to tolerate side effects and Dr Jefm Bryant the rhemo was reluctant to put pt on indomethacin, advised I would fax Dr Bensimhon's ov note from 2/16 where he states ok to use prn.  She also states pt was started on Trazodone by pcp to help him relax and sleep and it has really helped so far.  Med list updated and note faxed

## 2014-08-22 ENCOUNTER — Inpatient Hospital Stay: Payer: Self-pay | Admitting: Infectious Diseases

## 2014-08-22 ENCOUNTER — Telehealth: Payer: Self-pay | Admitting: Physician Assistant

## 2014-08-22 DIAGNOSIS — I2510 Atherosclerotic heart disease of native coronary artery without angina pectoris: Secondary | ICD-10-CM | POA: Diagnosis not present

## 2014-08-22 DIAGNOSIS — Z87442 Personal history of urinary calculi: Secondary | ICD-10-CM | POA: Diagnosis not present

## 2014-08-22 DIAGNOSIS — I82401 Acute embolism and thrombosis of unspecified deep veins of right lower extremity: Secondary | ICD-10-CM | POA: Diagnosis present

## 2014-08-22 DIAGNOSIS — E119 Type 2 diabetes mellitus without complications: Secondary | ICD-10-CM | POA: Diagnosis not present

## 2014-08-22 DIAGNOSIS — Z79899 Other long term (current) drug therapy: Secondary | ICD-10-CM | POA: Diagnosis not present

## 2014-08-22 DIAGNOSIS — D72829 Elevated white blood cell count, unspecified: Secondary | ICD-10-CM | POA: Diagnosis not present

## 2014-08-22 DIAGNOSIS — R0602 Shortness of breath: Secondary | ICD-10-CM | POA: Diagnosis not present

## 2014-08-22 DIAGNOSIS — I252 Old myocardial infarction: Secondary | ICD-10-CM | POA: Diagnosis not present

## 2014-08-22 DIAGNOSIS — I25119 Atherosclerotic heart disease of native coronary artery with unspecified angina pectoris: Secondary | ICD-10-CM | POA: Diagnosis present

## 2014-08-22 DIAGNOSIS — Z7902 Long term (current) use of antithrombotics/antiplatelets: Secondary | ICD-10-CM | POA: Diagnosis not present

## 2014-08-22 DIAGNOSIS — I5022 Chronic systolic (congestive) heart failure: Secondary | ICD-10-CM | POA: Diagnosis not present

## 2014-08-22 DIAGNOSIS — Z8249 Family history of ischemic heart disease and other diseases of the circulatory system: Secondary | ICD-10-CM | POA: Diagnosis not present

## 2014-08-22 DIAGNOSIS — I5023 Acute on chronic systolic (congestive) heart failure: Secondary | ICD-10-CM | POA: Diagnosis not present

## 2014-08-22 DIAGNOSIS — E780 Pure hypercholesterolemia: Secondary | ICD-10-CM | POA: Diagnosis present

## 2014-08-22 DIAGNOSIS — Z955 Presence of coronary angioplasty implant and graft: Secondary | ICD-10-CM | POA: Diagnosis not present

## 2014-08-22 DIAGNOSIS — I255 Ischemic cardiomyopathy: Secondary | ICD-10-CM | POA: Diagnosis present

## 2014-08-22 DIAGNOSIS — J189 Pneumonia, unspecified organism: Secondary | ICD-10-CM | POA: Diagnosis present

## 2014-08-22 DIAGNOSIS — I82411 Acute embolism and thrombosis of right femoral vein: Secondary | ICD-10-CM | POA: Diagnosis not present

## 2014-08-22 DIAGNOSIS — N179 Acute kidney failure, unspecified: Secondary | ICD-10-CM | POA: Diagnosis present

## 2014-08-22 DIAGNOSIS — Z7982 Long term (current) use of aspirin: Secondary | ICD-10-CM | POA: Diagnosis not present

## 2014-08-22 DIAGNOSIS — R7989 Other specified abnormal findings of blood chemistry: Secondary | ICD-10-CM | POA: Diagnosis not present

## 2014-08-22 DIAGNOSIS — I313 Pericardial effusion (noninflammatory): Secondary | ICD-10-CM | POA: Diagnosis not present

## 2014-08-22 DIAGNOSIS — I10 Essential (primary) hypertension: Secondary | ICD-10-CM | POA: Diagnosis not present

## 2014-08-22 DIAGNOSIS — I2699 Other pulmonary embolism without acute cor pulmonale: Secondary | ICD-10-CM | POA: Diagnosis not present

## 2014-08-22 DIAGNOSIS — M459 Ankylosing spondylitis of unspecified sites in spine: Secondary | ICD-10-CM | POA: Diagnosis present

## 2014-08-22 NOTE — Telephone Encounter (Signed)
    Was having issues with ankylosing spondylitis pain and not supposed take indocin due to heart issues. He took extra sulfalzine as directed by his rheumatologist which made him sick. He has been increasingly SOB and having weight gain, orthopnea and PND. [facility] is much easier for them to get to and wife was wondering if it was okay to go to that hospital. I told him that was okay and agreed that he go to the ED for evaluation.    Angelena Form PA-C  [facility]

## 2014-08-23 ENCOUNTER — Other Ambulatory Visit: Payer: Self-pay | Admitting: Physician Assistant

## 2014-08-23 DIAGNOSIS — I2699 Other pulmonary embolism without acute cor pulmonale: Secondary | ICD-10-CM | POA: Diagnosis not present

## 2014-08-23 DIAGNOSIS — I2510 Atherosclerotic heart disease of native coronary artery without angina pectoris: Secondary | ICD-10-CM

## 2014-08-23 DIAGNOSIS — I313 Pericardial effusion (noninflammatory): Secondary | ICD-10-CM

## 2014-08-23 DIAGNOSIS — I10 Essential (primary) hypertension: Secondary | ICD-10-CM

## 2014-08-23 DIAGNOSIS — I5022 Chronic systolic (congestive) heart failure: Secondary | ICD-10-CM

## 2014-08-25 DIAGNOSIS — I2699 Other pulmonary embolism without acute cor pulmonale: Secondary | ICD-10-CM | POA: Diagnosis not present

## 2014-08-26 ENCOUNTER — Inpatient Hospital Stay (HOSPITAL_COMMUNITY): Payer: Medicare Other

## 2014-08-26 ENCOUNTER — Other Ambulatory Visit: Payer: Self-pay | Admitting: Physician Assistant

## 2014-08-26 ENCOUNTER — Encounter (HOSPITAL_COMMUNITY): Payer: Self-pay | Admitting: Physician Assistant

## 2014-08-26 ENCOUNTER — Inpatient Hospital Stay (HOSPITAL_COMMUNITY)
Admission: AD | Admit: 2014-08-26 | Discharge: 2014-09-02 | DRG: 166 | Disposition: A | Discharge status: Home / Self Care | Payer: Medicare Other | Source: Other Acute Inpatient Hospital | Attending: Cardiology | Admitting: Cardiology

## 2014-08-26 DIAGNOSIS — Z7902 Long term (current) use of antithrombotics/antiplatelets: Secondary | ICD-10-CM

## 2014-08-26 DIAGNOSIS — E1122 Type 2 diabetes mellitus with diabetic chronic kidney disease: Secondary | ICD-10-CM | POA: Diagnosis present

## 2014-08-26 DIAGNOSIS — I5022 Chronic systolic (congestive) heart failure: Secondary | ICD-10-CM | POA: Diagnosis present

## 2014-08-26 DIAGNOSIS — J90 Pleural effusion, not elsewhere classified: Secondary | ICD-10-CM | POA: Diagnosis not present

## 2014-08-26 DIAGNOSIS — T45515A Adverse effect of anticoagulants, initial encounter: Secondary | ICD-10-CM | POA: Diagnosis present

## 2014-08-26 DIAGNOSIS — I2510 Atherosclerotic heart disease of native coronary artery without angina pectoris: Secondary | ICD-10-CM | POA: Diagnosis present

## 2014-08-26 DIAGNOSIS — Z955 Presence of coronary angioplasty implant and graft: Secondary | ICD-10-CM

## 2014-08-26 DIAGNOSIS — I2699 Other pulmonary embolism without acute cor pulmonale: Principal | ICD-10-CM

## 2014-08-26 DIAGNOSIS — Z7982 Long term (current) use of aspirin: Secondary | ICD-10-CM

## 2014-08-26 DIAGNOSIS — J9621 Acute and chronic respiratory failure with hypoxia: Secondary | ICD-10-CM | POA: Diagnosis not present

## 2014-08-26 DIAGNOSIS — I5023 Acute on chronic systolic (congestive) heart failure: Secondary | ICD-10-CM | POA: Diagnosis not present

## 2014-08-26 DIAGNOSIS — J9601 Acute respiratory failure with hypoxia: Secondary | ICD-10-CM

## 2014-08-26 DIAGNOSIS — R0602 Shortness of breath: Secondary | ICD-10-CM | POA: Diagnosis not present

## 2014-08-26 DIAGNOSIS — I82401 Acute embolism and thrombosis of unspecified deep veins of right lower extremity: Secondary | ICD-10-CM | POA: Diagnosis present

## 2014-08-26 DIAGNOSIS — I82411 Acute embolism and thrombosis of right femoral vein: Secondary | ICD-10-CM | POA: Diagnosis not present

## 2014-08-26 DIAGNOSIS — I129 Hypertensive chronic kidney disease with stage 1 through stage 4 chronic kidney disease, or unspecified chronic kidney disease: Secondary | ICD-10-CM | POA: Diagnosis present

## 2014-08-26 DIAGNOSIS — I252 Old myocardial infarction: Secondary | ICD-10-CM | POA: Diagnosis not present

## 2014-08-26 DIAGNOSIS — I2102 ST elevation (STEMI) myocardial infarction involving left anterior descending coronary artery: Secondary | ICD-10-CM

## 2014-08-26 DIAGNOSIS — E785 Hyperlipidemia, unspecified: Secondary | ICD-10-CM | POA: Diagnosis present

## 2014-08-26 DIAGNOSIS — Z87891 Personal history of nicotine dependence: Secondary | ICD-10-CM

## 2014-08-26 DIAGNOSIS — J810 Acute pulmonary edema: Secondary | ICD-10-CM | POA: Diagnosis present

## 2014-08-26 DIAGNOSIS — R042 Hemoptysis: Secondary | ICD-10-CM | POA: Diagnosis not present

## 2014-08-26 DIAGNOSIS — N189 Chronic kidney disease, unspecified: Secondary | ICD-10-CM | POA: Diagnosis present

## 2014-08-26 DIAGNOSIS — R0902 Hypoxemia: Secondary | ICD-10-CM

## 2014-08-26 DIAGNOSIS — I255 Ischemic cardiomyopathy: Secondary | ICD-10-CM | POA: Diagnosis present

## 2014-08-26 DIAGNOSIS — D72829 Elevated white blood cell count, unspecified: Secondary | ICD-10-CM | POA: Diagnosis not present

## 2014-08-26 DIAGNOSIS — R918 Other nonspecific abnormal finding of lung field: Secondary | ICD-10-CM | POA: Diagnosis not present

## 2014-08-26 HISTORY — DX: Pleural effusion, not elsewhere classified: J90

## 2014-08-26 HISTORY — DX: Disorder of arteries and arterioles, unspecified: I77.9

## 2014-08-26 HISTORY — DX: Acute embolism and thrombosis of unspecified deep veins of right lower extremity: I82.401

## 2014-08-26 HISTORY — DX: Peripheral vascular disease, unspecified: I73.9

## 2014-08-26 HISTORY — DX: Ankylosing spondylitis of unspecified sites in spine: M45.9

## 2014-08-26 HISTORY — DX: Ischemic cardiomyopathy: I25.5

## 2014-08-26 HISTORY — DX: Other pulmonary embolism without acute cor pulmonale: I26.99

## 2014-08-26 HISTORY — DX: Ventricular tachycardia, unspecified: I47.20

## 2014-08-26 HISTORY — DX: Calculus of kidney: N20.0

## 2014-08-26 HISTORY — DX: Atherosclerotic heart disease of native coronary artery without angina pectoris: I25.10

## 2014-08-26 HISTORY — DX: Acute and chronic respiratory failure, unspecified whether with hypoxia or hypercapnia: J96.20

## 2014-08-26 HISTORY — DX: Hypotension, unspecified: I95.9

## 2014-08-26 HISTORY — DX: Chronic systolic (congestive) heart failure: I50.22

## 2014-08-26 HISTORY — DX: Elevated white blood cell count, unspecified: D72.829

## 2014-08-26 HISTORY — DX: Hemoptysis: R04.2

## 2014-08-26 HISTORY — DX: Ventricular tachycardia: I47.2

## 2014-08-26 LAB — BLOOD GAS, ARTERIAL
Acid-base deficit: 0.8 mmol/L (ref 0.0–2.0)
BICARBONATE: 23.9 meq/L (ref 20.0–24.0)
Drawn by: 39866
FIO2: 1 %
O2 Saturation: 99.1 %
PATIENT TEMPERATURE: 98.7
PH ART: 7.355 (ref 7.350–7.450)
PO2 ART: 183 mmHg — AB (ref 80.0–100.0)
TCO2: 25.3 mmol/L (ref 0–100)
pCO2 arterial: 44.1 mmHg (ref 35.0–45.0)

## 2014-08-26 MED ORDER — TRAZODONE HCL 50 MG PO TABS
50.0000 mg | ORAL_TABLET | Freq: Every evening | ORAL | Status: DC | PRN
  Filled 2014-08-26: qty 1

## 2014-08-26 MED ORDER — ONDANSETRON HCL 4 MG/2ML IJ SOLN: 4.0000 mg | Freq: Four times a day (QID) | INTRAMUSCULAR | Status: DC | PRN

## 2014-08-26 MED ORDER — AZITHROMYCIN 250 MG PO TABS
250.0000 mg | ORAL_TABLET | Freq: Every day | ORAL | Status: DC
  Administered 2014-08-27 – 2014-08-30 (×4): 250 mg via ORAL
  Filled 2014-08-26 (×5): qty 1

## 2014-08-26 MED ORDER — DIGOXIN 125 MCG PO TABS
0.1250 mg | ORAL_TABLET | Freq: Every day | ORAL | Status: DC
  Administered 2014-08-27 – 2014-09-02 (×7): 0.125 mg via ORAL
  Filled 2014-08-26 (×7): qty 1

## 2014-08-26 MED ORDER — PANTOPRAZOLE SODIUM 40 MG PO TBEC
40.0000 mg | DELAYED_RELEASE_TABLET | Freq: Every day | ORAL | Status: DC
  Administered 2014-08-26 – 2014-09-02 (×8): 40 mg via ORAL
  Filled 2014-08-26 (×7): qty 1

## 2014-08-26 MED ORDER — ZOLPIDEM TARTRATE 5 MG PO TABS: 10.0000 mg | ORAL_TABLET | Freq: Every evening | ORAL | Status: DC | PRN

## 2014-08-26 MED ORDER — SODIUM CHLORIDE 0.9 % IV SOLN
250.0000 mL | INTRAVENOUS | Status: DC | PRN
Start: 2014-08-26 — End: 2014-09-02

## 2014-08-26 MED ORDER — CARVEDILOL 3.125 MG PO TABS
3.1250 mg | ORAL_TABLET | Freq: Two times a day (BID) | ORAL | Status: DC
  Administered 2014-08-27 – 2014-09-02 (×12): 3.125 mg via ORAL
  Filled 2014-08-26 (×16): qty 1

## 2014-08-26 MED ORDER — ZOLPIDEM TARTRATE 5 MG PO TABS: 5.0000 mg | ORAL_TABLET | Freq: Every evening | ORAL | Status: DC | PRN

## 2014-08-26 MED ORDER — FUROSEMIDE 10 MG/ML IJ SOLN
20.0000 mg | Freq: Once | INTRAMUSCULAR | Status: AC
  Administered 2014-08-26: 20 mg via INTRAVENOUS
  Filled 2014-08-26: qty 2

## 2014-08-26 MED ORDER — SODIUM CHLORIDE 0.9 % IJ SOLN
3.0000 mL | Freq: Two times a day (BID) | INTRAMUSCULAR | Status: DC
  Administered 2014-08-27 – 2014-08-31 (×4): 3 mL via INTRAVENOUS

## 2014-08-26 MED ORDER — HYDROCOD POLST-CHLORPHEN POLST 10-8 MG/5ML PO LQCR: 5.0000 mL | Freq: Two times a day (BID) | ORAL | Status: DC

## 2014-08-26 MED ORDER — HEPARIN (PORCINE) IN NACL 100-0.45 UNIT/ML-% IJ SOLN
1900.0000 [IU]/h | INTRAMUSCULAR | Status: DC
  Administered 2014-08-26: 1800 [IU]/h via INTRAVENOUS
  Filled 2014-08-26 (×2): qty 250

## 2014-08-26 MED ORDER — FUROSEMIDE 40 MG PO TABS
40.0000 mg | ORAL_TABLET | Freq: Every day | ORAL | Status: DC
  Filled 2014-08-26: qty 1

## 2014-08-26 MED ORDER — ROSUVASTATIN CALCIUM 10 MG PO TABS
10.0000 mg | ORAL_TABLET | Freq: Every day | ORAL | Status: DC
  Administered 2014-08-28 – 2014-09-01 (×5): 10 mg via ORAL
  Filled 2014-08-26 (×7): qty 1

## 2014-08-26 MED ORDER — IPRATROPIUM-ALBUTEROL 0.5-2.5 (3) MG/3ML IN SOLN
3.0000 mL | RESPIRATORY_TRACT | Status: DC
  Administered 2014-08-26 – 2014-08-27 (×4): 3 mL via RESPIRATORY_TRACT
  Filled 2014-08-26 (×4): qty 3

## 2014-08-26 MED ORDER — ACETAMINOPHEN 325 MG PO TABS: 650.0000 mg | ORAL_TABLET | ORAL | Status: DC | PRN

## 2014-08-26 MED ORDER — SODIUM CHLORIDE 0.9 % IJ SOLN: 3.0000 mL | INTRAMUSCULAR | Status: DC | PRN

## 2014-08-26 MED ORDER — CLOPIDOGREL BISULFATE 75 MG PO TABS
75.0000 mg | ORAL_TABLET | Freq: Every day | ORAL | Status: DC
  Administered 2014-08-27 – 2014-09-02 (×7): 75 mg via ORAL
  Filled 2014-08-26 (×7): qty 1

## 2014-08-26 MED ORDER — GUAIFENESIN-CODEINE 100-10 MG/5ML PO SOLN
10.0000 mL | ORAL | Status: DC | PRN
  Administered 2014-08-26 – 2014-08-28 (×3): 10 mL via ORAL
  Filled 2014-08-26 (×4): qty 10

## 2014-08-26 MED ORDER — LOSARTAN POTASSIUM 25 MG PO TABS
12.5000 mg | ORAL_TABLET | Freq: Every day | ORAL | Status: DC
  Administered 2014-08-28 – 2014-09-02 (×4): 12.5 mg via ORAL
  Filled 2014-08-26 (×8): qty 0.5

## 2014-08-26 NOTE — Progress Notes (Signed)
Admitted 3S01 VSS pt without complaint

## 2014-08-26 NOTE — Consult Note (Signed)
PULMONARY / CRITICAL CARE MEDICINE   Name: Scott [name] MRN: [id number] DOB: Dec 11, 1945    ADMISSION DATE:  08/26/2014 CONSULTATION DATE:  08/26/14  REFERRING MD :  Christell Faith PA-C  CHIEF COMPLAINT:  hypoxia  INITIAL PRESENTATION: 69 year old male with history of CAD with stenting of LCx 2013 with recent history of STEMI 06/12/2014 s/p PCI to LAD complicated by post-cath shock required IABP then developed VT s/p DCCV, EF 20%, NSTEMI 06/26/2014 treated medically, DM2, acute respiratory failure, carotid stenting, and ankylosing spondylitis who presented to [facility] 08/22/2013 with 5 day history of worsening SOB, weight gain, and was found to have bilateral PE. Transfer to [facility] 2/25. CCM called 2/25 for worsening hypoxia.   STUDIES:  LE dopplers from OSH: Right leg DVT  CT Chest from OSH: bilateral diffuse PEs w/o overt signs of right heart strain ECHO 12/12 >>EF 20%; mid Lventricle and apex is akinetic ECHO 12/21 >> Echo showed EF <20%, severely decreased global systolic LV function, dilated CM, moderate MR/TR  SIGNIFICANT EVENTS: 12/12 >> STEMI s/p PCI to LAD complicated by post-cath shock required IABP then developed VT s/p DCCV 12/26 >> Admitted to [facility] for NSTEMI with peak troponin of 2.50  HISTORY OF PRESENT ILLNESS:  Experienced increased SOB on 2/21and  was found to have bilateral PE @ [facility]. He was started on heparin gtt. On the afternoon of 2/23 BP was in the 70s/50s requiring 250 cc NS bolus and suspension of medication except for digoxin. 2/25 deemed to be mildly fluid overloaded today and was given Lasix 40 mg and requested on behalf of the family and the hospitalist for transfer to [facility]. Initially 92% on 4 L nasal cannula.  At 9 PM on 225, being acutely more hypoxic with saturations dropping to low 80s after returning from restroom requiring transition to nonrebreather and CCM was consulted.  He denies any current chest pain.  He does complain of worsening shortness of breath since  arrival and finds it difficult to breathe even while lying at an incline in bed.   PAST MEDICAL HISTORY :   has a past medical history of Hyperlipidemia; Hypertension; Diabetes; Chronic kidney disease; Spondylitis; CAD (coronary artery disease); and Ischemic cardiomyopathy.  has past surgical history that includes left heart catheterization with coronary angiogram (N/A, 06/12/2014). Prior to Admission medications   Medication Sig Start Date End Date Taking? Authorizing Provider  acetaminophen (TYLENOL) 500 MG tablet Take 1,000 mg by mouth every 6 (six) hours as needed.   Yes Historical Provider, MD  indomethacin (INDOCIN SR) 75 MG CR capsule Take 75 mg by mouth daily.   Yes Historical Provider, MD  metaxalone (SKELAXIN) 800 MG tablet Take 800 mg by mouth daily as needed. As needed   Yes Historical Provider, MD  nitroGLYCERIN (NITROSTAT) 0.3 MG SL tablet Place 0.3 mg under the tongue every 5 (five) minutes as needed for chest pain.   Yes Historical Provider, MD  SIMETHICONE PO Take 1 tablet by mouth daily as needed (bloating). Take 1 chewable tablet of [facility] as needed for bloating.   Yes Historical Provider, MD  aspirin EC 81 MG tablet Take 81 mg by mouth daily.    Historical Provider, MD  carvedilol (COREG) 6.25 MG tablet Take 0.5 tablets (3.125 mg total) by mouth 2 (two) times daily. 07/20/14   [name], MD  clopidogrel (PLAVIX) 75 MG tablet Take 1 tablet (75 mg total) by mouth daily with breakfast. 07/20/14   [name], MD  digoxin (  LANOXIN) 0.125 MG tablet Take 1 tablet (0.125 mg total) by mouth daily. 07/20/14   [name], MD  ivabradine (CORLANOR) 5 MG TABS tablet Take 1 tablet (5 mg total) by mouth 2 (two) times daily with a meal. 08/17/14   [name], MD  losartan (COZAAR) 25 MG tablet Take 0.5 tablets (12.5 mg total) by mouth daily. Only takes when BP is higher then 90 SBP 08/17/14   [name], MD  Multiple Vitamins-Minerals (MULTIVITAMIN  WITH MINERALS) tablet Take 1 tablet by mouth daily.    Historical Provider, MD  pantoprazole (PROTONIX) 40 MG tablet Take 1 tablet (40 mg total) by mouth daily. 07/20/14   [name], MD  rosuvastatin (CRESTOR) 10 MG tablet Take 10 mg by mouth daily.    Historical Provider, MD  traZODone (DESYREL) 100 MG tablet Take 50 mg by mouth at bedtime.    Historical Provider, MD  zolpidem (AMBIEN) 10 MG tablet Take 1 tablet (10 mg total) by mouth at bedtime as needed for sleep. 07/20/14 08/19/14  [name], MD   No Active Allergies  FAMILY HISTORY:  has no family status information on file.  SOCIAL HISTORY:  reports that he quit smoking about 41 years ago. He does not have any smokeless tobacco history on file. He reports that he does not drink alcohol or use illicit drugs.  REVIEW OF SYSTEMS:   General: negative for chills, fever, night sweats or weight changes.  Cardiovascular: positive for edema, and SOB. negative for chest pain, orthopnea, palpitations, paroxysmal nocturnal Dermatological: negative for rash Respiratory: negative for cough or wheezing Urologic: negative for hematuria Abdominal: negative for nausea, vomiting, diarrhea, bright red blood per rectum, melena, or hematemesis Neurologic: negative for visual changes, syncope, or dizziness All other systems reviewed and are otherwise negative except as noted above.  SUBJECTIVE:   VITAL SIGNS: Temp:  [98.7 F (37.1 C)] 98.7 F (37.1 C) (02/25 1928) Pulse Rate:  [97] 97 (02/25 1928) BP: (118)/(69) 118/69 mmHg (02/25 1928) SpO2:  [94 %-98 %] 98 % (02/25 2125) FiO2 (%):  [100 %] 100 % (02/25 2125) Weight:  [205 lb 0.4 oz (93 kg)] 205 lb 0.4 oz (93 kg) (02/25 1937) HEMODYNAMICS:   VENTILATOR SETTINGS: Vent Mode:  [-]  FiO2 (%):  [100 %] 100 % INTAKE / OUTPUT: No intake or output data in the 24 hours ending 08/26/14 2309  PHYSICAL EXAMINATION: General: Sitting on side of bed mild distress Neck: Negative for  carotid bruits. JVD not elevated. Lungs: Diffuse rhonchi b/l. Able to talk to full sentances Heart: tachycardia with regular rhythm. No m/r/g appreciated. Abdomen: Soft, non-tender, non-distended with normoactive bowel sounds. No hepatojugular reflex. No rebound/guarding. No obvious abdominal masses. Msk: Strength and tone appear normal for age. Extremities:  No edema. Distal pedal pulses are 2+ and equal bilaterally. Neuro: Alert and oriented X 3. No focal deficit. No facial asymmetry. Moves all extremities spontaneously.  LABS:  CBC No results for input(s): WBC, HGB, HCT, PLT in the last 168 hours.  BMET No results for input(s): NA, K, CL, CO2, BUN, CREATININE, GLUCOSE in the last 168 hours.  ABG  Recent Labs Lab 08/26/14 2225  PHART 7.355  PCO2ART 44.1  PO2ART 183.0*   Cardiac Enzymes No results for input(s): TROPONINI, PROBNP in the last 168 hours.  Imaging No results found.   ASSESSMENT / PLAN:  PULMONARY OETT A: Bilateral PEs  Hypoxia - likely multifactorial due to PEs, HFrEF, and right pleural effusion.  abg reassuring. EKG w/o acute changes  P:   - Continue supplement oxygen - Attempt CPAP if unable to tolerate continue non-rebreather - IV lasix 20mg  x 1; Apply condom cath - Check cbc, bmet, trop, lactic acid  Olam Idler, MD 08/26/2014, 11:09 PM PGY-2, [facility]  Pulmonary and [place] Pager: [phone]   Attending:  I have seen and examined the patient with nurse practitioner/resident and agree with the note above.   On my exam Mr. Shearn was feeling better after receiving lasix.  He reports no dyspnea, just "fatigue".  He had the sudden onset of right sided chest pain and dyspnea 2 days ago, no fever or purulent cough.  On exam there are a few crackles in the lung bases.    CXR > bilateral diaphragm elevation, unclear if there is a sub-pulmonic effusion there or not  1) Acute  hypoxemic respiratory failure> it is somewhat reassuring that his PaO2 was fairly high on the NRB and he is feeling better after diuresis.  This is primarily due to PE, though I think there is a component of atelectasis from splinting and possibly a pleural effusion on the right.  Perhaps pulmonary edema given his cardiac history. 2) Chronic systolic heart failure 3) Pleural effusion R  Plan: BIPAP PRN Ultrasound chest to look for effusion on R Gentle diuresis Continue heparin for now for provoked PE Would hold off on thrombolytics as he is hemodynamically stable Continue SDU monitoring  [name], MD [place] [facility] Pager: [phone] Cell: [phone] If no response, call [phone]

## 2014-08-26 NOTE — Progress Notes (Signed)
ANTICOAGULATION CONSULT NOTE - Initial Consult  Pharmacy Consult for Heparin Indication:DVT / PE  No Active Allergies  Patient Measurements: Height: 5\' 8"  (172.7 cm) Weight: 205 lb 0.4 oz (93 kg) IBW/kg (Calculated) : 68.4  Vital Signs: Temp: 98.7 F (37.1 C) (02/25 1928) Temp Source: Oral (02/25 1928) BP: 118/69 mmHg (02/25 1928) Pulse Rate: 97 (02/25 1928)  Labs: No results for input(s): HGB, HCT, PLT, APTT, LABPROT, INR, HEPARINUNFRC, CREATININE, CKTOTAL, CKMB, TROPONINI in the last 72 hours.  Estimated Creatinine Clearance: 64.3 mL/min (by C-G formula based on Cr of 1.25).   Medical History: Past Medical History  Diagnosis Date  . Hyperlipidemia   . Hypertension   . Diabetes   . Chronic kidney disease     hx kidney stones  . Spondylitis   . CAD (coronary artery disease)     a. stenting of LCx 2013; b. STEMI 06/12/14 s/p PCI to LAD complicated by post cath shock, VT s/p DCCV, EF 20%; c. NSTEMI 06/26/14 treated medically  . Ischemic cardiomyopathy     a. echo 08/23/2014 EF <20%, dilated CM, mod MR/TR     Assessment: 69 year old male who transferred from [facility] on heparin drip for PE / DVT.  He has been on heparin since Sunday evening.  His heparin level this AM was 0.21 with an increase in rate to 1800 units / hr (after a 1300 unit bolus)  He is currently on heparin at 1800 units / hr.  Some reports of hemoptysis in his chart from [place] with plans to consider an IVC filter if bleeding continues (he did receive a few doses of Coumadin at [facility])  He has a long complicated cardiac history starting in December of 2015 with an anterior STEMI and severe CHF (last EF = 20%) (see cardiology note 2/25)  Goal of Therapy:  Heparin level 0.3-0.7 units/ml Monitor platelets by anticoagulation protocol: Yes   Plan:  Continue heparin at 1800 units / hr Follow up heparin level this evening Daily heparin level / CBC  Thank you. Anette Guarneri,  PharmD [phone]  08/26/2014,8:35 PM

## 2014-08-26 NOTE — Progress Notes (Signed)
eLink Physician-Brief Progress Note Patient Name: Scott [name] DOB: [birth year] MRN: [id number]   Date of Service  08/26/2014  HPI/Events of Note  cough  eICU Interventions  Rob ac 2 tsp q 4 h prn      Intervention Category Minor Interventions: Routine modifications to care plan (e.g. PRN medications for pain, fever)  Christinia Gully 08/26/2014, 9:47 PM

## 2014-08-26 NOTE — H&P (Signed)
History and Physical  Patient ID: Scott [name] MRN: [id number], DOB: 05-18-1946 Date of Encounter: 08/26/2014, 2:46 PM Primary Physician: Hewitt Blade. Sarina Ser, MD  Primary Cardiologist: Dr. Haroldine Laws, MD  Chief Complaint: Worsening SOB x 5 days Reason for Admission: Bilateral PE  HPI: 69 year old male with history of CAD with stenting of LCx 2013 with recent history of STEMI 06/12/2014 s/p PCI to LAD complicated by post-cath shock required IABP then developed VT s/p DCCV, EF 20%, NSTEMI 06/26/2014 treated medically, DM2, acute respiratory failure, carotid stenting, and ankylosing spondylitis who presented to [facility] 08/22/2013 with 5 day history of worsening SOB, weight gain, and was found to have bilateral PE.  He is previously a patient of Dr. Clayborn Bigness, MD, who followed him for his CAD, HTN, and history of WCT which he was "considered for follow up by EP."    He was recently admitted to [facility] 06/12/2014 with anterior STEMI and taken to the cath lab emergently with Dr. Gwenlyn Found and underwent PCI to LAD. In the lab LVEDP was very high and BP was low so IABP placed. He was transferred to the CCU. On arrival to CCU, he developed VT and respiratory failure post cath that required emergent cardiovesions and short term intubation. He was placed on amiodarone drip and after he was adequately loaded he wastransitioned to po. At the time of discharge he maintained NSR.   He has severe LV dysfunction on ECHO with EF 20%. He was started on low dose carvedilol, spironolactone, however arb was not added due to soft bp. He was discharged with Life Vest and will continue until he has repeat ECHO. At his first HF vist with Dr. Haroldine Laws he was feeling tired. Mild DOE. No edema, orthopnea or PND. Wearing LifeVest. Weighing daily. Losing about a pound every day. Weight at that time 208. No dizziness. Not taking lasix. Norvasc was stopped and Coreg was increased to 6.25 mg bid, unfortunately he did not tolerate this  increase 2/2 low BP and he was forced to go back to 3.125 mg bid. He takes indomethacin for his ankylosing sponylitis. On 12/26 he was admitted to [facility] for NSTEMI with peak troponin of 2.50. He was not seen by his primary cardiology group [place]. He was seen by [facility]. His Aldactone was stopped. In follow up with [place] his losartan was cut in half. When he followed back up with his primary cardiologist 07/2014 his SBP running 84-104 (mostly in low 90s). Feeling tired/lousy. Trying to walk 15 mins bid. No CP. No edema, orthopnea or PND. Wearing LifeVest. Not sleeping well. Denies PND/orthopnea. Has tried melatonin with no help. Weight down 210-> 197. In follow up with Dr. Haroldine Laws 08/2014 Says he was doing well until Feb 2 when HR jumped from the 60s to 65-105. Had ECG on Feb 8th which showed NSR at 94. Was having more pain in sternum from ankylosing spondylosis. Sulfasalazine not helping. Started back on indomethacin pill again and HR came back to 70-80s. Stopped indomethacin and HR back up. Continues to wear LifeVest. No firings. SBP. SBP running 84-104 (mostly in low 90s). Holds losartan if BP < 90. No CP. No edema, orthopnea or PND. Has good days and bad days. When he feels good can walk 15 mins in am and 15 mins in pm. Weight stable 194-196. He was started on Colanor 5 mg bid 2/2 elevated HR.   He called the answering service on 2/21 complaining of increased SOB. Was having issues with ankylosing spondylitis  pain and not supposed take indocin due to heart issues, per rheum though Cardio wanted him to take prn. He took extra sulfalzine as directed by his rheumatologist which made him sick. He has been increasingly SOB and having weight gain, orthopnea and PND. He was advised to come to [facility]. He noted trace bilateral swelling along the ankles, o/w no swelling. Some hemoptysis. Weight has remained around 195 to 196. Some bloating. Blood pressure has been slightly high for him (90s/70s). Upon his arrival to [facility] he was  found to have bilateral PE. He was started on heparin gtt. Troponin 0.21-->0.19. WBC 20.1-->17.6. Echo showed EF <20%, severely decreased global systolic LV function, dilated CM, moderate MR/TR. Blood pressures remained soft in the 90s/70s. On the afternoon of 2/23 BP was in the 70s/50s requiring 250 cc NS bolus and suspension of medication except for digoxin. He seemed mildly fluid overloaded today and was given Lasix 40 mg. We were requested on behalf of the family and the hospitalist for transfer to [facility].   Past Medical History  Diagnosis Date  . Hyperlipidemia   . Hypertension   . Diabetes   . Chronic kidney disease     hx kidney stones  . Spondylitis   . CAD (coronary artery disease)     a. stenting of LCx 2013; b. STEMI 06/12/14 s/p PCI to LAD complicated by post cath shock, VT s/p DCCV, EF 20%; c. NSTEMI 06/26/14 treated medically  . Ischemic cardiomyopathy     a. echo 08/23/2014 EF <20%, dilated CM, mod MR/TR     Most Recent Cardiac Studies: Echo 08/23/2014  Summary:  1. Left ventricular ejection fraction, by visual estimation, is <20%.  2. Severely decreased global left ventricular systolic function.  3. Severely increased left ventricular internal cavity size.  4. Moderately enlarged right ventricle.  5. Moderately dilated left atrium.  6. Moderately dilated right atrium.  7. Moderate mitral valve regurgitation.  8. Moderate tricuspid regurgitation.    Surgical History:  Past Surgical History  Procedure Laterality Date  . Left heart catheterization with coronary angiogram N/A 06/12/2014    Procedure: LEFT HEART CATHETERIZATION WITH CORONARY ANGIOGRAM;  Surgeon: Lorretta Harp, MD;  Location: [facility] CATH LAB;  Service: Cardiovascular;  Laterality: N/A;     Home Meds: Prior to Admission medications   Medication Sig Start Date End Date Taking? Authorizing Provider  aspirin EC 81 MG tablet Take 81 mg by mouth daily.    Historical Provider, MD  carvedilol (COREG) 6.25 MG  tablet Take 0.5 tablets (3.125 mg total) by mouth 2 (two) times daily. 07/20/14   [name], MD  clopidogrel (PLAVIX) 75 MG tablet Take 1 tablet (75 mg total) by mouth daily with breakfast. 07/20/14   [name], MD  digoxin (LANOXIN) 0.125 MG tablet Take 1 tablet (0.125 mg total) by mouth daily. 07/20/14   [name], MD  ivabradine (CORLANOR) 5 MG TABS tablet Take 1 tablet (5 mg total) by mouth 2 (two) times daily with a meal. 08/17/14   [name], MD  losartan (COZAAR) 25 MG tablet Take 0.5 tablets (12.5 mg total) by mouth daily. Only takes when BP is higher then 90 SBP 08/17/14   [name], MD  metaxalone (SKELAXIN) 800 MG tablet Take 800 mg by mouth daily. As needed    Historical Provider, MD  Multiple Vitamins-Minerals (MULTIVITAMIN WITH MINERALS) tablet Take 1 tablet by mouth daily.    Historical Provider, MD  pantoprazole (PROTONIX) 40 MG  tablet Take 1 tablet (40 mg total) by mouth daily. 07/20/14   [name], MD  rosuvastatin (CRESTOR) 10 MG tablet Take 10 mg by mouth daily.    Historical Provider, MD  traZODone (DESYREL) 100 MG tablet Take 50 mg by mouth at bedtime.    Historical Provider, MD  zolpidem (AMBIEN) 10 MG tablet Take 1 tablet (10 mg total) by mouth at bedtime as needed for sleep. 07/20/14 08/19/14  [name], MD    Allergies:  Allergies  Allergen Reactions  . Sulfasalazine     History   Social History  . Marital Status: Married    Spouse Name: N/A  . Number of Children: N/A  . Years of Education: N/A   Occupational History  . Not on file.   Social History Main Topics  . Smoking status: Former Smoker    Quit date: 06/12/1973  . Smokeless tobacco: Not on file  . Alcohol Use: No  . Drug Use: No  . Sexual Activity: Not on file   Other Topics Concern  . Not on file   Social History Narrative     Family History  Problem Relation Age of Onset  . Hypertension Mother     Review of Systems: General:  negative for chills, fever, night sweats or weight changes.  Cardiovascular: positive for edema, and SOB. negative for chest pain, orthopnea, palpitations, paroxysmal nocturnal Dermatological: negative for rash Respiratory: negative for cough or wheezing Urologic: negative for hematuria Abdominal: negative for nausea, vomiting, diarrhea, bright red blood per rectum, melena, or hematemesis Neurologic: negative for visual changes, syncope, or dizziness All other systems reviewed and are otherwise negative except as noted above.  Labs:   Lab Results  Component Value Date   WBC 11.5* 06/22/2014   HGB 11.8* 06/22/2014   HCT 36.2* 06/22/2014   MCV 95.5 06/22/2014   PLT 467* 06/22/2014   No results for input(s): NA, K, CL, CO2, BUN, CREATININE, CALCIUM, PROT, BILITOT, ALKPHOS, ALT, AST, GLUCOSE in the last 168 hours.  Invalid input(s): LABALBU No results for input(s): CKTOTAL, CKMB, TROPONINI in the last 72 hours. No results found for: CHOL, HDL, LDLCALC, TRIG No results found for: DDIMER  Radiology/Studies:  No results found.   EKG: NSR, 81 bpm, no st/t changes  Physical Exam: BP: 86/65, P: 78, T: 98.1, R: 29, O2: 96% on 2L [place] General: Well developed, well nourished, in no acute distress. Head: Normocephalic, atraumatic, sclera non-icteric, no xanthomas, nares are without discharge.  Neck: Negative for carotid bruits. JVD not elevated. Lungs: Clear bilaterally to auscultation without wheezes, rales, or rhonchi. Breathing is unlabored. Heart: RRR with S1 S2. No murmurs, rubs, or gallops appreciated. Abdomen: Soft, non-tender, non-distended with normoactive bowel sounds. No hepatomegaly. No rebound/guarding. No obvious abdominal masses. Msk:  Strength and tone appear normal for age. Extremities: No clubbing or cyanosis. No edema.  Distal pedal pulses are 2+ and equal bilaterally. Neuro: Alert and oriented X 3. No focal deficit. No facial asymmetry. Moves all extremities  spontaneously. Psych:  Responds to questions appropriately with a normal affect.    ASSESSMENT AND PLAN:  69 year old male with history of CAD with stenting of LCX 2013 with recent history of STEMI 06/12/2014 s/p PCI to LAD complicated by post-cath shock required IABP then developed VT s/p DCCV, EF 20%, NSTEMI 06/26/2014 treated medically, DM2, acute respiratory failure, carotid stenting, and ankylosing spondylitis who presented to [facility] 08/22/2013 with 5 day history of worsening SOB and was found to  have bilateral PE.  1. Bilateral PE: -Heparin gtt -Continue Warfarin,  use Heparin or lovenox bridge . -Patient with recent PCI of LAD 06/2014, continue Plavix for at least 12 months -No aspirin (no need for triple therapy) -Consider IVC filter -Hyper coag work up -Transfer to [facility]  2. Chronic systolic CHF: SOB improved, appears euvolemic -Echo showed EF <20%, dilated CM, moderate MR/TR (not yet at 3 month window for possible AICD) -Pressures at home have limited titration of medications -BP improved this morning as he did not receive losartan on 2/22 (SBP was greater than 90) -Suspend Coreg 3.125 mg bid 2/2 labile BP (he takes this unless his SBP is less than 90) -Suspend losartan (he takes this unless his SBP is less than 90) -Continue digoxin -He seems mildly fluid overloaded, added Lasix 40 mg po daily  3. CAD s/p stenting as above: -Continue Plavix -Coreg as above -Stop aspirin (triple therapy) -Crestor 10 mg  4. Hypotension: -monitor  Signed, Rosamaria Donn PA-C 08/26/2014, 2:46 PM

## 2014-08-26 NOTE — Progress Notes (Signed)
eLink Physician-Brief Progress Note Patient Name: Scott [name] DOB: 23-Oct-1945 MRN: [id number]   Date of Service  08/26/2014  HPI/Events of Note  resp failure / new dx bilateral pe  eICU Interventions  pCXR ordered     Intervention Category Major Interventions: Respiratory failure - evaluation and management  Christinia Gully 08/26/2014, 9:25 PM

## 2014-08-27 ENCOUNTER — Inpatient Hospital Stay (HOSPITAL_COMMUNITY): Payer: Medicare Other

## 2014-08-27 DIAGNOSIS — J90 Pleural effusion, not elsewhere classified: Secondary | ICD-10-CM

## 2014-08-27 DIAGNOSIS — R0902 Hypoxemia: Secondary | ICD-10-CM | POA: Insufficient documentation

## 2014-08-27 DIAGNOSIS — I5022 Chronic systolic (congestive) heart failure: Secondary | ICD-10-CM

## 2014-08-27 DIAGNOSIS — J9601 Acute respiratory failure with hypoxia: Secondary | ICD-10-CM

## 2014-08-27 LAB — CBC
HCT: 32 % — ABNORMAL LOW (ref 39.0–52.0)
HCT: 33 % — ABNORMAL LOW (ref 39.0–52.0)
Hemoglobin: 10.2 g/dL — ABNORMAL LOW (ref 13.0–17.0)
Hemoglobin: 10.5 g/dL — ABNORMAL LOW (ref 13.0–17.0)
MCH: 29.3 pg (ref 26.0–34.0)
MCH: 29.5 pg (ref 26.0–34.0)
MCHC: 31.9 g/dL (ref 30.0–36.0)
MCHC: 31.8 g/dL (ref 30.0–36.0)
MCV: 92.2 fL (ref 78.0–100.0)
MCV: 92.5 fL (ref 78.0–100.0)
PLATELETS: 416 10*3/uL — AB (ref 150–400)
PLATELETS: 430 10*3/uL — AB (ref 150–400)
RBC: 3.46 MIL/uL — ABNORMAL LOW (ref 4.22–5.81)
RBC: 3.58 MIL/uL — AB (ref 4.22–5.81)
RDW: 16.2 % — ABNORMAL HIGH (ref 11.5–15.5)
RDW: 16.2 % — AB (ref 11.5–15.5)
WBC: 22.1 10*3/uL — ABNORMAL HIGH (ref 4.0–10.5)
WBC: 22.4 10*3/uL — ABNORMAL HIGH (ref 4.0–10.5)

## 2014-08-27 LAB — PROTIME-INR
INR: 1.57 — AB (ref 0.00–1.49)
PROTHROMBIN TIME: 18.9 s — AB (ref 11.6–15.2)

## 2014-08-27 LAB — BASIC METABOLIC PANEL
ANION GAP: 9 (ref 5–15)
BUN: 31 mg/dL — AB (ref 6–23)
CHLORIDE: 101 mmol/L (ref 96–112)
CO2: 24 mmol/L (ref 19–32)
Calcium: 8.3 mg/dL — ABNORMAL LOW (ref 8.4–10.5)
Creatinine, Ser: 1.21 mg/dL (ref 0.50–1.35)
GFR, EST AFRICAN AMERICAN: 70 mL/min — AB (ref >90)
GFR, EST NON AFRICAN AMERICAN: 61 mL/min — AB (ref >90)
Glucose, Bld: 140 mg/dL — ABNORMAL HIGH (ref 70–99)
POTASSIUM: 4.2 mmol/L (ref 3.5–5.1)
Sodium: 134 mmol/L — ABNORMAL LOW (ref 135–145)

## 2014-08-27 LAB — BRAIN NATRIURETIC PEPTIDE: B Natriuretic Peptide: 1971.6 pg/mL — ABNORMAL HIGH (ref 0.0–100.0)

## 2014-08-27 LAB — COMPREHENSIVE METABOLIC PANEL
ALBUMIN: 2.6 g/dL — AB (ref 3.5–5.2)
ALK PHOS: 217 U/L — AB (ref 39–117)
ALT: 30 U/L (ref 0–53)
AST: 25 U/L (ref 0–37)
Anion gap: 9 (ref 5–15)
BUN: 30 mg/dL — AB (ref 6–23)
CO2: 27 mmol/L (ref 19–32)
CREATININE: 1.2 mg/dL (ref 0.50–1.35)
Calcium: 8.2 mg/dL — ABNORMAL LOW (ref 8.4–10.5)
Chloride: 98 mmol/L (ref 96–112)
GFR calc Af Amer: 71 mL/min — ABNORMAL LOW (ref >90)
GFR calc non Af Amer: 61 mL/min — ABNORMAL LOW (ref >90)
Glucose, Bld: 177 mg/dL — ABNORMAL HIGH (ref 70–99)
POTASSIUM: 3.9 mmol/L (ref 3.5–5.1)
Sodium: 134 mmol/L — ABNORMAL LOW (ref 135–145)
TOTAL PROTEIN: 6.1 g/dL (ref 6.0–8.3)
Total Bilirubin: 0.9 mg/dL (ref 0.3–1.2)

## 2014-08-27 LAB — MAGNESIUM: MAGNESIUM: 2 mg/dL (ref 1.5–2.5)

## 2014-08-27 LAB — MRSA PCR SCREENING: MRSA BY PCR: NEGATIVE

## 2014-08-27 LAB — HEPARIN LEVEL (UNFRACTIONATED)
HEPARIN UNFRACTIONATED: 0.29 [IU]/mL — AB (ref 0.30–0.70)
Heparin Unfractionated: 0.14 IU/mL — ABNORMAL LOW (ref 0.30–0.70)
Heparin Unfractionated: 0.4 IU/mL (ref 0.30–0.70)

## 2014-08-27 LAB — TROPONIN I: Troponin I: 0.12 ng/mL — ABNORMAL HIGH (ref <0.031)

## 2014-08-27 LAB — TSH: TSH: 2.341 u[IU]/mL (ref 0.350–4.500)

## 2014-08-27 LAB — DIGOXIN LEVEL: DIGOXIN LVL: 0.6 ng/mL — AB (ref 0.8–2.0)

## 2014-08-27 LAB — APTT: aPTT: 74 seconds — ABNORMAL HIGH (ref 24–37)

## 2014-08-27 MED ORDER — MIDAZOLAM HCL 2 MG/2ML IJ SOLN
INTRAMUSCULAR | Status: AC | PRN
  Administered 2014-08-27: 0.5 mg via INTRAVENOUS

## 2014-08-27 MED ORDER — FUROSEMIDE 10 MG/ML IJ SOLN
20.0000 mg | Freq: Once | INTRAMUSCULAR | Status: AC
  Administered 2014-08-27: 20 mg via INTRAVENOUS
  Filled 2014-08-27: qty 2

## 2014-08-27 MED ORDER — FENTANYL CITRATE 0.05 MG/ML IJ SOLN
INTRAMUSCULAR | Status: AC | PRN
Start: 2014-08-27 — End: 2014-08-27
  Administered 2014-08-27: 25 ug via INTRAVENOUS

## 2014-08-27 MED ORDER — FUROSEMIDE 10 MG/ML IJ SOLN: 40.0000 mg | Freq: Once | INTRAMUSCULAR | Status: DC

## 2014-08-27 MED ORDER — WARFARIN SODIUM 7.5 MG PO TABS
7.5000 mg | ORAL_TABLET | ORAL | Status: AC
  Administered 2014-08-27: 7.5 mg via ORAL
  Filled 2014-08-27: qty 1

## 2014-08-27 MED ORDER — MIDAZOLAM HCL 2 MG/2ML IJ SOLN
INTRAMUSCULAR | Status: AC
  Filled 2014-08-27: qty 2

## 2014-08-27 MED ORDER — IOHEXOL 300 MG/ML  SOLN
100.0000 mL | Freq: Once | INTRAMUSCULAR | Status: AC | PRN
  Administered 2014-08-27: 40 mL via INTRAVENOUS

## 2014-08-27 MED ORDER — WARFARIN - PHARMACIST DOSING INPATIENT
Freq: Every day | Status: DC
  Administered 2014-08-29 – 2014-08-30 (×2)

## 2014-08-27 MED ORDER — LIDOCAINE HCL (PF) 1 % IJ SOLN
INTRAMUSCULAR | Status: AC
  Filled 2014-08-27: qty 10

## 2014-08-27 MED ORDER — IPRATROPIUM-ALBUTEROL 0.5-2.5 (3) MG/3ML IN SOLN: 3.0000 mL | RESPIRATORY_TRACT | Status: DC | PRN

## 2014-08-27 MED ORDER — FENTANYL CITRATE 0.05 MG/ML IJ SOLN
INTRAMUSCULAR | Status: AC
  Filled 2014-08-27: qty 2

## 2014-08-27 MED ORDER — HEPARIN (PORCINE) IN NACL 100-0.45 UNIT/ML-% IJ SOLN
2200.0000 [IU]/h | INTRAMUSCULAR | Status: DC
  Administered 2014-08-27 – 2014-08-29 (×3): 2200 [IU]/h via INTRAVENOUS
  Filled 2014-08-27 (×7): qty 250

## 2014-08-27 NOTE — Progress Notes (Signed)
Spoke to Scott [name] in [employer]. Gave instructions to stop heparin gtt at 10am for possible procedure.

## 2014-08-27 NOTE — Progress Notes (Signed)
Advanced Heart Failure Rounding Note   Subjective:    69 year old male with history of CAD with stenting of LCx 2013 with recent history of STEMI 06/12/2014 s/p PCI to LAD complicated by post-cath shock required IABP then developed VT s/p DCCV, EF 20%, NSTEMI 06/26/2014 treated medically, DM2, acute respiratory failure, carotid stenting, and ankylosing spondylitis who presented to [facility] 08/22/2013 with 5 day history of worsening SOB, weight gain, and was found to have bilateral PE and RLE DVT.  Echo EF 20% with RV strain. Transfer to [facility] 2/25. CCM called 2/25 for worsening hypoxia. On heparin and coumadin.   Feels better. Breathing better. BP more stable. Small amount hemopysis this am. No CP.    Objective:   Weight Range:  Vital Signs:   Temp:  [97.7 F (36.5 C)-98.7 F (37.1 C)] 97.9 F (36.6 C) (02/26 0717) Pulse Rate:  [89-101] 95 (02/26 0717) Resp:  [28-32] 29 (02/26 0717) BP: (98-118)/(63-78) 98/63 mmHg (02/26 0717) SpO2:  [90 %-100 %] 96 % (02/26 0851) FiO2 (%):  [100 %] 100 % (02/26 0400) Weight:  [92.1 kg (203 lb 0.7 oz)-93 kg (205 lb 0.4 oz)] 92.1 kg (203 lb 0.7 oz) (02/26 0400) Last BM Date: 08/26/14  Weight change: Filed Weights   08/26/14 1937 08/27/14 0400  Weight: 93 kg (205 lb 0.4 oz) 92.1 kg (203 lb 0.7 oz)    Intake/Output:   Intake/Output Summary (Last 24 hours) at 08/27/14 0913 Last data filed at 08/27/14 0700  Gross per 24 hour  Intake    219 ml  Output   1375 ml  Net  -1156 ml     Physical Exam: General:  Fatigued appearing. No resp difficulty HEENT: normal Neck: supple. JVP 10-11 . Carotids 2+ bilat; no bruits. No lymphadenopathy or thryomegaly appreciated. Cor: PMI  Laterally displaced. Regular rate & rhythm. No rubs, gallops or murmurs. Lungs: clear Abdomen: soft, nontender, nondistended. No hepatosplenomegaly. No bruits or masses. Good bowel sounds. Extremities: no cyanosis, clubbing, rash, tr-1+ edema Neuro: alert & orientedx3, cranial nerves  grossly intact. moves all 4 extremities w/o difficulty. Affect pleasant  Telemetry: SR 85  Labs: Basic Metabolic Panel:  Recent Labs Lab 08/27/14 0025 08/27/14 0253  NA 134* 134*  K 4.2 3.9  CL 101 98  CO2 24 27  GLUCOSE 140* 177*  BUN 31* 30*  CREATININE 1.21 1.20  CALCIUM 8.3* 8.2*  MG  --  2.0    Liver Function Tests:  Recent Labs Lab 08/27/14 0253  AST 25  ALT 30  ALKPHOS 217*  BILITOT 0.9  PROT 6.1  ALBUMIN 2.6*   No results for input(s): LIPASE, AMYLASE in the last 168 hours. No results for input(s): AMMONIA in the last 168 hours.  CBC:  Recent Labs Lab 08/27/14 0025 08/27/14 0253  WBC 22.4* 22.1*  HGB 10.5* 10.2*  HCT 33.0* 32.0*  MCV 92.2 92.5  PLT 430* 416*    Cardiac Enzymes:  Recent Labs Lab 08/27/14 0025  TROPONINI 0.12*    BNP: BNP (last 3 results)  Recent Labs  07/20/14 1053 08/27/14 0253  BNP 773.6* 1971.6*    ProBNP (last 3 results) No results for input(s): PROBNP in the last 8760 hours.    Other results:    Imaging: Dg Chest Port 1 View  08/27/2014   CLINICAL DATA:  Hypoxia. Respiratory distress. Shortness of breath.  EXAM: PORTABLE CHEST - 1 VIEW  COMPARISON:  Chest 08/22/2014.  CT chest 08/22/2014.  FINDINGS: Shallow inspiration with elevation  of right hemidiaphragm. Focal infiltration in the right lung base medially corresponding to consolidation seen on previous CT. This may represent focal pneumonia. Cardiac enlargement. No pulmonary vascular congestion. No pneumothorax. Left lung is grossly clear.  IMPRESSION: Cardiac enlargement. Persistent airspace infiltration in the right cardiophrenic angle. Elevation of right hemidiaphragm.   Electronically Signed   By: Lucienne Capers M.D.   On: 08/27/2014 02:15      Medications:     Scheduled Medications: . azithromycin  250 mg Oral Daily  . carvedilol  3.125 mg Oral BID WC  . clopidogrel  75 mg Oral Daily  . digoxin  0.125 mg Oral Daily  . furosemide  40 mg  Intravenous Once  . losartan  12.5 mg Oral Daily  . pantoprazole  40 mg Oral Daily  . rosuvastatin  10 mg Oral q1800  . sodium chloride  3 mL Intravenous Q12H  . Warfarin - Pharmacist Dosing Inpatient   Does not apply q1800     Infusions: . heparin 1,900 Units/hr (08/27/14 0600)     PRN Medications:  sodium chloride, acetaminophen, guaiFENesin-codeine, ipratropium-albuterol, ondansetron (ZOFRAN) IV, sodium chloride, traZODone, zolpidem   Assessment:   1. Acute on chronic respiratory failure - multifactorial 2. Acute bilateral PE with significant clot burden with RV strain on echo 3. Chronic systolic HF. EF 20% 4. Acute R DVT 5. CAD with anterior STEMI s/p PCI LAD with DES 06/12/14    -c/p shock and VT requiring IABP and DC-CV 6. Ankylosing spondylitis 7. Hypotension  Plan/Discussion:    More stable this am. With RV strain, large PE burden and residual RLE DVT would place IVC filter at least temporarily. Hold further lasix today. Ok to continue low-dose HF meds as tolerated. Continue heparin/warfarin/plavix.   Appreciate CCM input.     Length of Stay: 1   Romon Devereux 08/27/2014, 9:13 AM  Advanced Heart Failure Team Pager [phone] (M-F; 7a - 4p)  Please contact [facility] for night-coverage after hours (4p -7a ) and weekends on [email or web address]

## 2014-08-27 NOTE — Progress Notes (Signed)
ANTICOAGULATION CONSULT NOTE - Initial Consult  Pharmacy Consult for Coumadin Indication: RLE DVT / Bilateral PE  No Active Allergies  Patient Measurements: Height: 5\' 8"  (172.7 cm) Weight: 203 lb 0.7 oz (92.1 kg) IBW/kg (Calculated) : 68.4  Vital Signs: Temp: 97.7 F (36.5 C) (02/26 0400) Temp Source: Axillary (02/26 0400) BP: 100/67 mmHg (02/26 0400) Pulse Rate: 89 (02/26 0400)  Labs:  Recent Labs  08/27/14 0025 08/27/14 0253  HGB 10.5* 10.2*  HCT 33.0* 32.0*  PLT 430* 416*  HEPARINUNFRC 0.40 0.29*  CREATININE 1.21 1.20  TROPONINI 0.12*  --     Estimated Creatinine Clearance: 66.7 mL/min (by C-G formula based on Cr of 1.2).   Medical History: Past Medical History  Diagnosis Date  . Hyperlipidemia   . Hypertension   . Diabetes   . Chronic kidney disease     hx kidney stones  . Spondylitis   . CAD (coronary artery disease)     a. stenting of LCx 2013; b. STEMI 06/12/14 s/p PCI to LAD complicated by post cath shock, VT s/p DCCV, EF 20%; c. NSTEMI 06/26/14 treated medically  . Ischemic cardiomyopathy     a. echo 08/23/2014 EF <20%, dilated CM, mod MR/TR     Assessment: 69 year old male who transferred from [name] on heparin drip for bilateral PE / RLE DVT. Heparin infusion continues at 1900 units/hr with 6 hour heparin level due at 10:00AM. Now starting Coumadin.  Christell Faith, PA-C reports that INR at [name] on 2/25 was 1.4.  Albumin =2.6    Goal of Therapy:  INR = 2 - 3 Heparin level 0.3-0.7 units/ml Monitor platelets by anticoagulation protocol: Yes   Plan:  Coumadin 7.5 mg this AM (none at 6pm today) Daily PT/INR  Continue heparin drip as previously ordered.  Thank you for allowing pharmacy to be part of this patients care team. Nicole Cella, RPh Clinical Pharmacist Pager: [phone] 08/27/2014,7:04 AM

## 2014-08-27 NOTE — Procedures (Signed)
Successful placement of an infrarenal IVC filter.  No immediate complications.

## 2014-08-27 NOTE — Progress Notes (Signed)
PULMONARY / CRITICAL CARE MEDICINE   Name: Scott [name] MRN: [id number] DOB: March 02, 1946    ADMISSION DATE:  08/26/2014 CONSULTATION DATE:  08/26/14  REFERRING MD :  Christell Faith PA-C  CHIEF COMPLAINT:  hypoxia  INITIAL PRESENTATION: 69 year old male with history of CAD with stenting of LCx 2013 with recent history of STEMI 06/12/2014 s/p PCI to LAD complicated by post-cath shock required IABP then developed VT s/p DCCV, EF 20%, NSTEMI 06/26/2014 treated medically, DM2, acute respiratory failure, carotid stenting, and ankylosing spondylitis who presented to [facility] 08/22/2013 with 5 day history of worsening SOB, weight gain, and was found to have bilateral PE. Transfer to [facility] 2/25. CCM called 2/25 for worsening hypoxia.   STUDIES:  LE dopplers from OSH: Right leg DVT  CT Chest from OSH: bilateral diffuse PEs w/o overt signs of right heart strain ECHO 12/12 >>EF 20%; mid Lventricle and apex is akinetic ECHO 12/21 >> Echo showed EF <20%, severely decreased global systolic LV function, dilated CM, moderate MR/TR  SIGNIFICANT EVENTS: 12/12 >> STEMI s/p PCI to LAD complicated by post-cath shock required IABP then developed VT s/p DCCV 12/26 >> Admitted to [facility] for NSTEMI with peak troponin of 2.50  SUBJECTIVE:  Has cough with blood tinged sputum.  Denies chest pain.  Feels tired.  VITAL SIGNS: Temp:  [97.7 F (36.5 C)-98.7 F (37.1 C)] 97.9 F (36.6 C) (02/26 0717) Pulse Rate:  [89-101] 95 (02/26 0717) Resp:  [28-32] 29 (02/26 0717) BP: (98-118)/(63-78) 98/63 mmHg (02/26 0717) SpO2:  [90 %-100 %] 96 % (02/26 0851) FiO2 (%):  [100 %] 100 % (02/26 0400) Weight:  [203 lb 0.7 oz (92.1 kg)-205 lb 0.4 oz (93 kg)] 203 lb 0.7 oz (92.1 kg) (02/26 0400) INTAKE / OUTPUT:  Intake/Output Summary (Last 24 hours) at 08/27/14 0859 Last data filed at 08/27/14 0700  Gross per 24 hour  Intake    219 ml  Output   1375 ml  Net  -1156 ml    PHYSICAL EXAMINATION: General: appears fatigued HEENT:  no sinus tenderness Lungs: basilar crackles Heart: regular, no murmur Abdomen: Soft, non tender Msk:no edema Neuro: Moves all extremities spontaneously.  LABS:  CBC  Recent Labs Lab 08/27/14 0025 08/27/14 0253  WBC 22.4* 22.1*  HGB 10.5* 10.2*  HCT 33.0* 32.0*  PLT 430* 416*    BMET  Recent Labs Lab 08/27/14 0025 08/27/14 0253  NA 134* 134*  K 4.2 3.9  CL 101 98  CO2 24 27  BUN 31* 30*  CREATININE 1.21 1.20  GLUCOSE 140* 177*    ABG  Recent Labs Lab 08/26/14 2225  PHART 7.355  PCO2ART 44.1  PO2ART 183.0*   Cardiac Enzymes  Recent Labs Lab 08/27/14 0025  TROPONINI 0.12*    Imaging Dg Chest Port 1 View  08/27/2014   CLINICAL DATA:  Hypoxia. Respiratory distress. Shortness of breath.  EXAM: PORTABLE CHEST - 1 VIEW  COMPARISON:  Chest 08/22/2014.  CT chest 08/22/2014.  FINDINGS: Shallow inspiration with elevation of right hemidiaphragm. Focal infiltration in the right lung base medially corresponding to consolidation seen on previous CT. This may represent focal pneumonia. Cardiac enlargement. No pulmonary vascular congestion. No pneumothorax. Left lung is grossly clear.  IMPRESSION: Cardiac enlargement. Persistent airspace infiltration in the right cardiophrenic angle. Elevation of right hemidiaphragm.   Electronically Signed   By: Lucienne Capers M.D.   On: 08/27/2014 02:15     ASSESSMENT / PLAN:  Acute hypoxic respiratory failure from acute PE, pleural effusions,  ATX with hx of chronic systolic CHF. Plan: - IR to do u/s and if significant effusion, then do thoracentesis >> will order tests if fluid sampling done - change duoneb to prn - oxygen to keep SpO2 > 92% - PRN BiPAP - f/u CXR - lasix 40 mg IV x one 2/26 - day 1 zithromax - heparin gtt per pharmacy   Chesley Mires, MD [place] 08/27/2014, 9:04 AM Pager:  [phone] After 3pm call: [phone]

## 2014-08-27 NOTE — Clinical Documentation Improvement (Signed)
WBC this admission noted below.  No specific documentation linking the elevated WBC count to meds.  Currently receiving azithromycin 250 mg po daily.  Component     Latest Ref Rng 08/27/2014 08/27/2014        12:25 AM  2:53 AM  WBC     4.0 - 10.5 K/uL 22.4 (H) 22.1 (H)   Please document the clinical significance, if any, regarding the elevated WBCs.  Thank You, Erling Conte ,RN Clinical Documentation Specialist:  [phone] [place] [employer]

## 2014-08-27 NOTE — Progress Notes (Signed)
Utilization review completed.  

## 2014-08-27 NOTE — Progress Notes (Signed)
Patient ID: Scott [name], male   DOB: May 11, 1946, 69 y.o.   MRN: [id number]  Limited US chest reveals small Rt pleural effusion  Discussed with Dr [place] Bells He was concerned if large effusion would need thoracentesis  Thoracentesis was not performed on small effusion All agree

## 2014-08-27 NOTE — Progress Notes (Addendum)
ANTICOAGULATION CONSULT NOTE - Follow Up Consult  Pharmacy Consult for Heparin/Coumadin Indication: RLE DVT / Bilateral PE  No Active Allergies  Patient Measurements: Height: 5\' 8"  (172.7 cm) Weight: 203 lb 0.7 oz (92.1 kg) IBW/kg (Calculated) : 68.4  Vital Signs: Temp: 97.9 F (36.6 C) (02/26 0717) Temp Source: Oral (02/26 0717) BP: 98/63 mmHg (02/26 0717) Pulse Rate: 95 (02/26 0717)  Labs:  Recent Labs  08/27/14 0025 08/27/14 0253  HGB 10.5* 10.2*  HCT 33.0* 32.0*  PLT 430* 416*  HEPARINUNFRC 0.40 0.29*  CREATININE 1.21 1.20  TROPONINI 0.12*  --     Estimated Creatinine Clearance: 66.7 mL/min (by C-G formula based on Cr of 1.2).   Medical History: Past Medical History  Diagnosis Date  . Hyperlipidemia   . Hypertension   . Diabetes   . Chronic kidney disease     hx kidney stones  . Spondylitis   . CAD (coronary artery disease)     a. stenting of LCx 2013; b. STEMI 06/12/14 s/p PCI to LAD complicated by post cath shock, VT s/p DCCV, EF 20%; c. NSTEMI 06/26/14 treated medically  . Ischemic cardiomyopathy     a. echo 08/23/2014 EF <20%, dilated CM, mod MR/TR     Assessment: 69 year old male admitted 08/26/2014  on transfer from [facility] on heparin drip for bilateral PE / RLE DVT. Pharmacy consulted to dose heparin.  PMH: STEMI 06/12/2014 s/p PCI to LAD complicated by post-cath shock required IABP then developed VT s/p DCCV, EF 20%, NSTEMI 06/26/2014 treated medically, DM2, acute respiratory failure, carotid stenting, and ankylosing spondylitis  Coag: new PE, d1/5d minimum overlap. Heparin infusion continues at 1900 units/hr with 6 hour heparin level due at 10:00AM. Now starting Coumadin.  Christell Faith, PA-C reports that INR at [facility] on 2/25 was 1.4. Albumin =2.6  CV: HF, CAD HR 90s, SBP 90s, 1150 out in last 8h (20IV received 2/25, and again 2/26 this am), additional 40 mg IV now ordered On: coreg 3.125 bid, dig 0.125, losartan 12.5, crestor 10  ID:  CAP 2/25 Azith>>   Goal of Therapy:  INR = 2 - 3 Heparin level 0.3-0.7 units/ml Monitor platelets by anticoagulation protocol: Yes   Plan:  Coumadin 7.5 mg this AM (none at 6pm today) Daily PT/INR  Continue heparin drip as previously ordered, follow up heparin level  Thank you for allowing pharmacy to be a part of this patients care team.  Scott [name] Pharm.D., BCPS, AQ-Cardiology Clinical Pharmacist 08/27/2014 9:01 AM Pager: [phone] Phone: [phone]   11:58 AM Heparin level reported as < goal drawn at 10am, just before heparin turned off 3h prior to IR trip for IVC Filter placement.  Anticipate order to resume heparin post IR will plan on a rate of 2200 units/hr, will schedule heparin level followup 6h after heparin actually resumed. [place]

## 2014-08-27 NOTE — Consult Note (Signed)
Chief Complaint: B PE; R DVT Large clot burden  Referring Physician(s): Dr Haroldine Laws   History of Present Illness: Scott [name] is a 69 y.o. male   Pt with extensive cardiac hx CHF; carotid disease STEMI 06/12/14; NSTEMI 06/26/14 Presented to [place] with wt gain; short of breath Work up reveals B PE and RLE DVT CT from outside facility shows no overt R heart strain per CCM note Started on coumadin and Heparin Transfer to [facility] 08/26/14 Request mad for placement of retrievable inferior vena cava filter per Dr Haroldine Laws Large clot burden with residual RLE DVT   Past Medical History  Diagnosis Date  . Hyperlipidemia   . Hypertension   . Diabetes   . Chronic kidney disease     hx kidney stones  . Spondylitis   . CAD (coronary artery disease)     a. stenting of LCx 2013; b. STEMI 06/12/14 s/p PCI to LAD complicated by post cath shock, VT s/p DCCV, EF 20%; c. NSTEMI 06/26/14 treated medically  . Ischemic cardiomyopathy     a. echo 08/23/2014 EF <20%, dilated CM, mod MR/TR    Past Surgical History  Procedure Laterality Date  . Left heart catheterization with coronary angiogram N/A 06/12/2014    Procedure: LEFT HEART CATHETERIZATION WITH CORONARY ANGIOGRAM;  Surgeon: Lorretta Harp, MD;  Location: [facility] CATH LAB;  Service: Cardiovascular;  Laterality: N/A;    Allergies: Review of patient's allergies indicates no active allergies.  Medications: Prior to Admission medications   Medication Sig Start Date End Date Taking? Authorizing Provider  acetaminophen (TYLENOL) 500 MG tablet Take 1,000 mg by mouth every 6 (six) hours as needed.   Yes Historical Provider, MD  indomethacin (INDOCIN SR) 75 MG CR capsule Take 75 mg by mouth daily.   Yes Historical Provider, MD  metaxalone (SKELAXIN) 800 MG tablet Take 800 mg by mouth daily as needed. As needed   Yes Historical Provider, MD  nitroGLYCERIN (NITROSTAT) 0.3 MG SL tablet Place 0.3 mg under the tongue every 5  (five) minutes as needed for chest pain.   Yes Historical Provider, MD  SIMETHICONE PO Take 1 tablet by mouth daily as needed (bloating). Take 1 chewable tablet of [facility] Gas-[facility] as needed for bloating.   Yes Historical Provider, MD  aspirin EC 81 MG tablet Take 81 mg by mouth daily.    Historical Provider, MD  carvedilol (COREG) 6.25 MG tablet Take 0.5 tablets (3.125 mg total) by mouth 2 (two) times daily. 07/20/14   [name], MD  clopidogrel (PLAVIX) 75 MG tablet Take 1 tablet (75 mg total) by mouth daily with breakfast. 07/20/14   [name], MD  digoxin (LANOXIN) 0.125 MG tablet Take 1 tablet (0.125 mg total) by mouth daily. 07/20/14   [name], MD  ivabradine (CORLANOR) 5 MG TABS tablet Take 1 tablet (5 mg total) by mouth 2 (two) times daily with a meal. 08/17/14   [name], MD  losartan (COZAAR) 25 MG tablet Take 0.5 tablets (12.5 mg total) by mouth daily. Only takes when BP is higher then 90 SBP 08/17/14   [name], MD  Multiple Vitamins-Minerals (MULTIVITAMIN WITH MINERALS) tablet Take 1 tablet by mouth daily.    Historical Provider, MD  pantoprazole (PROTONIX) 40 MG tablet Take 1 tablet (40 mg total) by mouth daily. 07/20/14   [name], MD  rosuvastatin (CRESTOR) 10 MG tablet Take 10 mg by mouth daily.    Historical Provider,  MD  traZODone (DESYREL) 100 MG tablet Take 50 mg by mouth at bedtime.    Historical Provider, MD  zolpidem (AMBIEN) 10 MG tablet Take 1 tablet (10 mg total) by mouth at bedtime as needed for sleep. 07/20/14 08/19/14  [name], MD     Family History  Problem Relation Age of Onset  . Hypertension Mother     History   Social History  . Marital Status: Married    Spouse Name: N/A  . Number of Children: N/A  . Years of Education: N/A   Social History Main Topics  . Smoking status: Former Smoker    Quit date: 06/12/1973  . Smokeless tobacco: Not on file  . Alcohol Use: No  . Drug Use: No  . Sexual  Activity: Not on file   Other Topics Concern  . Not on file   Social History Narrative    Review of Systems: A 12 point ROS discussed and pertinent positives are indicated in the HPI above.  All other systems are negative.  Review of Systems  Constitutional: Positive for activity change and fatigue. Negative for fever.  Respiratory: Positive for cough and shortness of breath.   Cardiovascular: Negative for chest pain.  Gastrointestinal: Positive for nausea.  Neurological: Positive for weakness.  Psychiatric/Behavioral: Negative for behavioral problems and confusion.    Vital Signs: BP 98/63 mmHg  Pulse 95  Temp(Src) 97.9 F (36.6 C) (Oral)  Resp 29  Ht 5\' 8"  (1.727 m)  Wt 92.1 kg (203 lb 0.7 oz)  BMI 30.88 kg/m2  SpO2 96%  Physical Exam  Constitutional: He is oriented to person, place, and time.  Cardiovascular: Normal rate and regular rhythm.   No murmur heard. Pulmonary/Chest: Effort normal. He has wheezes.  Abdominal: Soft. Bowel sounds are normal. There is no tenderness.  Musculoskeletal: Normal range of motion.  Neurological: He is alert and oriented to person, place, and time.  Skin: Skin is warm and dry.  Psychiatric: He has a normal mood and affect. His behavior is normal. Judgment and thought content normal.  Nursing note and vitals reviewed.   Mallampati Score:  MD Evaluation Airway: WNL Heart: WNL Abdomen: WNL Chest/ Lungs: WNL ASA  Classification: 3 Mallampati/Airway Score: One  Imaging: Dg Chest Port 1 View  08/27/2014   CLINICAL DATA:  Hypoxia. Respiratory distress. Shortness of breath.  EXAM: PORTABLE CHEST - 1 VIEW  COMPARISON:  Chest 08/22/2014.  CT chest 08/22/2014.  FINDINGS: Shallow inspiration with elevation of right hemidiaphragm. Focal infiltration in the right lung base medially corresponding to consolidation seen on previous CT. This may represent focal pneumonia. Cardiac enlargement. No pulmonary vascular congestion. No pneumothorax.  Left lung is grossly clear.  IMPRESSION: Cardiac enlargement. Persistent airspace infiltration in the right cardiophrenic angle. Elevation of right hemidiaphragm.   Electronically Signed   By: Lucienne Capers M.D.   On: 08/27/2014 02:15    Labs:  CBC:  Recent Labs  06/17/14 0550 06/22/14 1100 08/27/14 0025 08/27/14 0253  WBC 10.0 11.5* 22.4* 22.1*  HGB 11.6* 11.8* 10.5* 10.2*  HCT 36.0* 36.2* 33.0* 32.0*  PLT 326 467* 430* 416*    COAGS:  Recent Labs  06/13/14 0955  INR 1.13  APTT 55*    BMP:  Recent Labs  07/20/14 1053 08/17/14 1106 08/27/14 0025 08/27/14 0253  NA 137 141 134* 134*  K 4.4 4.6 4.2 3.9  CL 106 109 101 98  CO2 25 25 24 27   GLUCOSE 149* 116* 140* 177*  BUN 10 17 31* 30*  CALCIUM 9.5 9.1 8.3* 8.2*  CREATININE 1.23 1.25 1.21 1.20  GFRNONAA 59* 58* 61* 61*  GFRAA 69* 68* 70* 71*    LIVER FUNCTION TESTS:  Recent Labs  06/15/14 0340 06/16/14 0347 06/17/14 0550 08/27/14 0253  BILITOT 0.6 0.6 0.6 0.9  AST 176* 87* 58* 25  ALT 95* 70* 66* 30  ALKPHOS 97 126* 131* 217*  PROT 6.5 6.9 7.0 6.1  ALBUMIN 2.9* 3.0* 3.0* 2.6*    TUMOR MARKERS: No results for input(s): AFPTM, CEA, CA199, CHROMGRNA in the last 8760 hours.  Assessment and Plan:  Increasing SOB +BPE; R DVT No overt Rt heart strain per CTA outside facility Scheduled for retrievable inferior vena cava filter placement Secondary large clot burden with residual R DVT Pt and wife aware of procedure benefits and risks including but not limited to Infection; bleeding; vessel damage Agreeable to proceed Consent signed andin chart   Thank you for this interesting consult.  I greatly enjoyed meeting Scott [name] and look forward to participating in their care.  Signed: Caleen Taaffe A 08/27/2014, 10:59 AM   I spent a total of 20 minutes  in face to face in clinical consultation, greater than 50% of which was counseling/coordinating care for retrievable IVC  filter

## 2014-08-27 NOTE — Progress Notes (Signed)
ANTICOAGULATION CONSULT NOTE - Follow Up Consult  Pharmacy Consult for heparin Indication: pulmonary embolus and DVT   Labs:  Recent Labs  08/27/14 0025 08/27/14 0253  HGB 10.5* 10.2*  HCT 33.0* 32.0*  PLT 430* 416*  HEPARINUNFRC 0.40 0.29*  CREATININE 1.21  --   TROPONINI 0.12*  --       Assessment: 69yo male now slightly subtherapeutic on heparin after one level at goal.  Goal of Therapy:  Heparin level 0.3-0.7 units/ml   Plan:  Will increase heparin gtt slightly to 1900 units/hr and check level in [place], PharmD, BCPS  08/27/2014,3:47 AM

## 2014-08-27 NOTE — Progress Notes (Signed)
ANTICOAGULATION CONSULT NOTE - Follow Up Consult  Pharmacy Consult for heparin Indication: pulmonary embolus and DVT    Labs:  Recent Labs  08/27/14 0025  HGB 10.5*  HCT 33.0*  PLT 430*  HEPARINUNFRC 0.40     Assessment/Plan:  69yo male therapeutic on heparin after rate increase prior to transfer from [facility]. Will continue gtt at current rate and confirm stable with am labs.   Wynona Neat, PharmD, BCPS  08/27/2014,1:04 AM

## 2014-08-27 NOTE — Progress Notes (Signed)
Pt returned from IR s/p IVC filter. VSS, site soft, CDI, no signs of bleeding. Instructed pt on importance of lying flat for 2 hours, verbalized understanding.

## 2014-08-28 ENCOUNTER — Inpatient Hospital Stay (HOSPITAL_COMMUNITY): Payer: Medicare Other

## 2014-08-28 ENCOUNTER — Encounter (HOSPITAL_COMMUNITY): Payer: Self-pay | Admitting: Family Medicine

## 2014-08-28 DIAGNOSIS — R042 Hemoptysis: Secondary | ICD-10-CM | POA: Insufficient documentation

## 2014-08-28 LAB — PROTIME-INR
INR: 1.77 — ABNORMAL HIGH (ref 0.00–1.49)
Prothrombin Time: 20.8 s — ABNORMAL HIGH (ref 11.6–15.2)

## 2014-08-28 LAB — HEPARIN LEVEL (UNFRACTIONATED)
HEPARIN UNFRACTIONATED: 0.43 [IU]/mL (ref 0.30–0.70)
Heparin Unfractionated: 0.31 [IU]/mL (ref 0.30–0.70)

## 2014-08-28 LAB — BASIC METABOLIC PANEL
Anion gap: 6 (ref 5–15)
BUN: 24 mg/dL — ABNORMAL HIGH (ref 6–23)
CALCIUM: 8.2 mg/dL — AB (ref 8.4–10.5)
CO2: 30 mmol/L (ref 19–32)
CREATININE: 0.99 mg/dL (ref 0.50–1.35)
Chloride: 101 mmol/L (ref 96–112)
GFR calc Af Amer: >90 mL/min (ref >90)
GFR, EST NON AFRICAN AMERICAN: 83 mL/min — AB (ref >90)
Glucose, Bld: 153 mg/dL — ABNORMAL HIGH (ref 70–99)
Potassium: 4 mmol/L (ref 3.5–5.1)
Sodium: 137 mmol/L (ref 135–145)

## 2014-08-28 LAB — CBC
HCT: 32.7 % — ABNORMAL LOW (ref 39.0–52.0)
Hemoglobin: 10.3 g/dL — ABNORMAL LOW (ref 13.0–17.0)
MCH: 28.9 pg (ref 26.0–34.0)
MCHC: 31.5 g/dL (ref 30.0–36.0)
MCV: 91.6 fL (ref 78.0–100.0)
Platelets: 400 K/uL (ref 150–400)
RBC: 3.57 MIL/uL — ABNORMAL LOW (ref 4.22–5.81)
RDW: 16 % — ABNORMAL HIGH (ref 11.5–15.5)
WBC: 18.3 K/uL — ABNORMAL HIGH (ref 4.0–10.5)

## 2014-08-28 MED ORDER — WARFARIN SODIUM 5 MG PO TABS
5.0000 mg | ORAL_TABLET | Freq: Once | ORAL | Status: AC
  Administered 2014-08-28: 5 mg via ORAL
  Filled 2014-08-28: qty 1

## 2014-08-28 NOTE — Progress Notes (Signed)
ANTICOAGULATION CONSULT NOTE - Follow Up Consult  Pharmacy Consult for heparin Indication: pulmonary embolus and DVT  Labs:  Recent Labs  08/27/14 0025 08/27/14 0253 08/27/14 1000 08/28/14 0304  HGB 10.5* 10.2*  --  10.3*  HCT 33.0* 32.0*  --  32.7*  PLT 430* 416*  --  400  APTT  --   --  74*  --   LABPROT  --   --  18.9* 20.8*  INR  --   --  1.57* 1.77*  HEPARINUNFRC 0.40 0.29* 0.14* 0.31  CREATININE 1.21 1.20  --   --   TROPONINI 0.12*  --   --   --      Assessment/Plan:  69yo male therapeutic on heparin after resumed at higher rate though at low end of goal; drawn after ~5hr of infusion so may continue to increase. Will continue gtt at current rate and confirm stable with additional level.   Wynona Neat, PharmD, BCPS  08/28/2014,3:59 AM

## 2014-08-28 NOTE — Progress Notes (Signed)
ANTICOAGULATION CONSULT NOTE - Follow Up Consult  Pharmacy Consult:  Heparin Indication:  Bilateral PE + RLE DVT  No Active Allergies  Patient Measurements: Height: 5\' 8"  (172.7 cm) Weight: 201 lb 4.5 oz (91.3 kg) IBW/kg (Calculated) : 68.4 Heparin Dosing Weight: 87 kg  Vital Signs: Temp: 98.6 F (37 C) (02/27 1300) Temp Source: Oral (02/27 1300) BP: 90/61 mmHg (02/27 1252) Pulse Rate: 98 (02/27 0737)  Labs:  Recent Labs  08/27/14 0025 08/27/14 0253 08/27/14 1000 08/28/14 0304 08/28/14 1012  HGB 10.5* 10.2*  --  10.3*  --   HCT 33.0* 32.0*  --  32.7*  --   PLT 430* 416*  --  400  --   APTT  --   --  74*  --   --   LABPROT  --   --  18.9* 20.8*  --   INR  --   --  1.57* 1.77*  --   HEPARINUNFRC 0.40 0.29* 0.14* 0.31 0.43  CREATININE 1.21 1.20  --  0.99  --   TROPONINI 0.12*  --   --   --   --     Estimated Creatinine Clearance: 80.6 mL/min (by C-G formula based on Cr of 0.99).      Assessment: 89 YOM to continue on IV heparin and Coumadin for new bilateral PE and RLE DVT.  Patient's heparin level is therapeutic.  His INR is trending up towards goal.  Noted documentation of low volume hemoptysis.  He continues on azithromycin for HCAP.   Goal of Therapy:  INR 2-3 Heparin level 0.3-0.7 units/ml  Monitor platelets by anticoagulation protocol: Yes    Plan:  - Continue heparin gtt at 2200 units/hr - Coumadin 5mg  PO today - Daily PT / INR / CBC / HL - F/U resume home meds - Monitor closely for bleeding    Breyanna Valera D. Mina Marble, PharmD, BCPS Pager:  [phone] 08/28/2014, 2:56 PM

## 2014-08-28 NOTE — Progress Notes (Signed)
PULMONARY / CRITICAL CARE MEDICINE   Name: Scott [name] MRN: [id number] DOB: Jun 16, 1946    ADMISSION DATE:  08/26/2014 CONSULTATION DATE:  08/26/14  REFERRING MD :  Christell Faith PA-C  CHIEF COMPLAINT:  hypoxia  INITIAL PRESENTATION: 69 year old male with history of CAD with stenting of LCx 2013 with recent history of STEMI 06/12/2014 s/p PCI to LAD complicated by post-cath shock required IABP then developed VT s/p DCCV, EF 20%, NSTEMI 06/26/2014 treated medically, DM2, acute respiratory failure, carotid stenting, and ankylosing spondylitis who presented to [facility] 08/22/2013 with 5 day history of worsening SOB, weight gain, and was found to have bilateral PE. Transfer to [facility] 2/25. CCM called 2/25 for worsening hypoxia.   STUDIES:  LE dopplers from OSH: Right leg DVT  CT Chest from OSH: bilateral diffuse PEs w/o overt signs of right heart strain ECHO 12/12 >>EF 20%; mid Lventricle and apex is akinetic ECHO 12/21 >> Echo showed EF <20%, severely decreased global systolic LV function, dilated CM, moderate MR/TR  SIGNIFICANT EVENTS: 12/12 >> STEMI s/p PCI to LAD complicated by post-cath shock required IABP then developed VT s/p DCCV 12/26 >> Admitted to [facility] for NSTEMI with peak troponin of 2.50  SUBJECTIVE:  No new events overnight.  Has not required bipap, no increased wob.  CXR today with mild elevation right HD and very small effusion  VITAL SIGNS: Temp:  [97.5 F (36.4 C)-99.3 F (37.4 C)] 98.3 F (36.8 C) (02/27 0737) Pulse Rate:  [90-102] 98 (02/27 0737) Resp:  [18-35] 23 (02/27 0737) BP: (90-110)/(61-78) 100/68 mmHg (02/27 0737) SpO2:  [92 %-97 %] 96 % (02/27 0737) Weight:  [91.3 kg (201 lb 4.5 oz)] 91.3 kg (201 lb 4.5 oz) (02/27 0353) INTAKE / OUTPUT:  Intake/Output Summary (Last 24 hours) at 08/28/14 1132 Last data filed at 08/28/14 0900  Gross per 24 hour  Intake    330 ml  Output   1825 ml  Net  -1495 ml    PHYSICAL EXAMINATION: General: weak appearing male in  nad HEENT: nose without purulence or d/c noted Lungs: upper airway noise noted, decreased bs on right, a few scattered crackles Heart: regular, no murmur Abdomen: Soft, non tender BMW:UXLK LE edema Neuro: awake, alert, moves all 4.   LABS:  CBC  Recent Labs Lab 08/27/14 0025 08/27/14 0253 08/28/14 0304  WBC 22.4* 22.1* 18.3*  HGB 10.5* 10.2* 10.3*  HCT 33.0* 32.0* 32.7*  PLT 430* 416* 400    BMET  Recent Labs Lab 08/27/14 0025 08/27/14 0253 08/28/14 0304  NA 134* 134* 137  K 4.2 3.9 4.0  CL 101 98 101  CO2 24 27 30   BUN 31* 30* 24*  CREATININE 1.21 1.20 0.99  GLUCOSE 140* 177* 153*    ABG  Recent Labs Lab 08/26/14 2225  PHART 7.355  PCO2ART 44.1  PO2ART 183.0*   Cardiac Enzymes  Recent Labs Lab 08/27/14 0025  TROPONINI 0.12*    Imaging [place] Chest  08/27/2014   CLINICAL DATA:  Acute respiratory failure with hypoxia. Pleural effusion.  EXAM: CHEST ULTRASOUND  COMPARISON:  None.  FINDINGS: Ultrasound imaging of the right hemithorax shows the presence of a small right pleural effusion. No plural effusion is seen in the left lower hemithorax.  IMPRESSION: Small right-sided pleural effusion.   Electronically Signed   By: Earle Gell M.D.   On: 08/27/2014 14:27   Ir Ivc Filter Plmt / S&i /img Guid/mod Sed  08/27/2014   INDICATION: History of pulmonary embolism and  right lower extremity DVT. Patient with history of ischemic cardiomyopathy. Request made for placement of an IVC filter to help to prevent additional lower extremity clot from migrating to the lungs and further worsening patient's right-sided heart failure.  EXAM: ULTRASOUND GUIDANCE FOR VASCULAR ACCESS  IVC CATHETERIZATION AND VENOGRAM  IVC FILTER INSERTION  MEDICATIONS: Fentanyl 25 mcg IV; Versed 0.5 mg IV  ANESTHESIA/SEDATION: Sedation Time  7 minutes  CONTRAST:  51mL OMNIPAQUE IOHEXOL 300 MG/ML  SOLN  FLUOROSCOPY TIME:  1 minutes (60.7 mGy)  COMPLICATIONS: None immediate  PROCEDURE: Informed written  consent was obtained from the patient following explanation of the procedure, risks, benefits and alternatives. A time out was performed prior to the initiation of the procedure. Maximal barrier sterile technique utilized including caps, mask, sterile gowns, sterile gloves, large sterile drape, hand hygiene, and Betadine prep.  Under sterile condition and local anesthesia, right common femoral venous access was performed with ultrasound. An ultrasound image was saved and sent to PACS. Over a guidewire, the IVC filter delivery sheath and inner dilator were advanced into the IVC just above the IVC bifurcation. Contrast injection was performed for an IVC venogram.  Through the delivery sheath, a retrievable Denali IVC filter was deployed below the level of the renal veins and above the IVC bifurcation. Limited post deployment venacavagram was performed.  The delivery sheath was removed and hemostasis was obtained with manual compression. A dressing was placed. The patient tolerated the procedure well without immediate post procedural complication.  FINDINGS: The IVC is patent. No evidence of thrombus, stenosis, or occlusion. No variant venous anatomy. Successful placement of the IVC filter below the level of the renal veins.  IMPRESSION: Successful ultrasound and fluoroscopically guided placement of an infrarenal retrievable IVC filter.  PLAN: This IVC filter is potentially retrievable. The patient will be assessed for filter retrieval by Interventional Radiology in approximately 8-12 weeks. Further recommendations regarding filter retrieval, continued surveillance or declaration of device permanence, will be made at that time.   Electronically Signed   By: Sandi Mariscal M.D.   On: 08/27/2014 19:03     ASSESSMENT / PLAN:  Acute hypoxic respiratory failure from acute PE, pleural effusions, ATX with hx of chronic systolic CHF. IVC has been placed.  Effusions are minimal at this point  Plan:  -d/c bipap -OOB

## 2014-08-28 NOTE — Progress Notes (Signed)
Advanced Heart Failure Rounding Note   Subjective:    69 year old male with history of CAD with stenting of LCx 2013 with recent history of STEMI 06/12/2014 s/p PCI to LAD complicated by post-cath shock required IABP then developed VT s/p DCCV, EF 20%, NSTEMI 06/26/2014 treated medically, DM2, acute respiratory failure, carotid stenting, and ankylosing spondylitis who presented to [facility] 08/22/2013 with 5 day history of worsening SOB, weight gain, and was found to have bilateral PE and RLE DVT.  Echo EF 20% with RV strain. Transfer to [facility] 2/25. CCM called 2/25 for worsening hypoxia. On heparin and coumadin.   He is s/p IVC filter yesterday. Feels ok. Still with low volume hemoptysis. Breathing a bit better. SBP 95-105.    Objective:   Weight Range:  Vital Signs:   Temp:  [97.5 F (36.4 C)-98.6 F (37 C)] 98.3 F (36.8 C) (02/27 0737) Pulse Rate:  [90-102] 98 (02/27 0737) Resp:  [18-33] 23 (02/27 0737) BP: (90-110)/(61-78) 100/68 mmHg (02/27 0737) SpO2:  [92 %-97 %] 96 % (02/27 0737) Weight:  [91.3 kg (201 lb 4.5 oz)] 91.3 kg (201 lb 4.5 oz) (02/27 0353) Last BM Date: 08/28/14  Weight change: Filed Weights   08/26/14 1937 08/27/14 0400 08/28/14 0353  Weight: 93 kg (205 lb 0.4 oz) 92.1 kg (203 lb 0.7 oz) 91.3 kg (201 lb 4.5 oz)    Intake/Output:   Intake/Output Summary (Last 24 hours) at 08/28/14 1330 Last data filed at 08/28/14 0900  Gross per 24 hour  Intake    330 ml  Output   1425 ml  Net  -1095 ml     Physical Exam: General:  Sitting in chair coughing HEENT: normal Neck: supple. JVP 9Carotids 2+ bilat; no bruits. No lymphadenopathy or thryomegaly appreciated. Cor: PMI  Laterally displaced. Regular rate & rhythm. No rubs, gallops or murmurs. Lungs: crackles and dull in R base Abdomen: soft, nontender, nondistended. No hepatosplenomegaly. No bruits or masses. Good bowel sounds. Extremities: no cyanosis, clubbing, rash, tr- edema Neuro: alert & orientedx3, cranial nerves  grossly intact. moves all 4 extremities w/o difficulty. Affect pleasant  Telemetry: SR 70-80  Labs: Basic Metabolic Panel:  Recent Labs Lab 08/27/14 0025 08/27/14 0253 08/28/14 0304  NA 134* 134* 137  K 4.2 3.9 4.0  CL 101 98 101  CO2 24 27 30   GLUCOSE 140* 177* 153*  BUN 31* 30* 24*  CREATININE 1.21 1.20 0.99  CALCIUM 8.3* 8.2* 8.2*  MG  --  2.0  --     Liver Function Tests:  Recent Labs Lab 08/27/14 0253  AST 25  ALT 30  ALKPHOS 217*  BILITOT 0.9  PROT 6.1  ALBUMIN 2.6*   No results for input(s): LIPASE, AMYLASE in the last 168 hours. No results for input(s): AMMONIA in the last 168 hours.  CBC:  Recent Labs Lab 08/27/14 0025 08/27/14 0253 08/28/14 0304  WBC 22.4* 22.1* 18.3*  HGB 10.5* 10.2* 10.3*  HCT 33.0* 32.0* 32.7*  MCV 92.2 92.5 91.6  PLT 430* 416* 400    Cardiac Enzymes:  Recent Labs Lab 08/27/14 0025  TROPONINI 0.12*    BNP: BNP (last 3 results)  Recent Labs  07/20/14 1053 08/27/14 0253  BNP 773.6* 1971.6*    ProBNP (last 3 results) No results for input(s): PROBNP in the last 8760 hours.    Other results:    Imaging: [place] Chest  08/27/2014   CLINICAL DATA:  Acute respiratory failure with hypoxia. Pleural effusion.  EXAM: CHEST  ULTRASOUND  COMPARISON:  None.  FINDINGS: Ultrasound imaging of the right hemithorax shows the presence of a small right pleural effusion. No plural effusion is seen in the left lower hemithorax.  IMPRESSION: Small right-sided pleural effusion.   Electronically Signed   By: Earle Gell M.D.   On: 08/27/2014 14:27   Ir Ivc Filter Plmt / S&i /img Guid/mod Sed  08/27/2014   INDICATION: History of pulmonary embolism and right lower extremity DVT. Patient with history of ischemic cardiomyopathy. Request made for placement of an IVC filter to help to prevent additional lower extremity clot from migrating to the lungs and further worsening patient's right-sided heart failure.  EXAM: ULTRASOUND GUIDANCE FOR  VASCULAR ACCESS  IVC CATHETERIZATION AND VENOGRAM  IVC FILTER INSERTION  MEDICATIONS: Fentanyl 25 mcg IV; Versed 0.5 mg IV  ANESTHESIA/SEDATION: Sedation Time  7 minutes  CONTRAST:  75mL OMNIPAQUE IOHEXOL 300 MG/ML  SOLN  FLUOROSCOPY TIME:  1 minutes (16.1 mGy)  COMPLICATIONS: None immediate  PROCEDURE: Informed written consent was obtained from the patient following explanation of the procedure, risks, benefits and alternatives. A time out was performed prior to the initiation of the procedure. Maximal barrier sterile technique utilized including caps, mask, sterile gowns, sterile gloves, large sterile drape, hand hygiene, and Betadine prep.  Under sterile condition and local anesthesia, right common femoral venous access was performed with ultrasound. An ultrasound image was saved and sent to PACS. Over a guidewire, the IVC filter delivery sheath and inner dilator were advanced into the IVC just above the IVC bifurcation. Contrast injection was performed for an IVC venogram.  Through the delivery sheath, a retrievable Denali IVC filter was deployed below the level of the renal veins and above the IVC bifurcation. Limited post deployment venacavagram was performed.  The delivery sheath was removed and hemostasis was obtained with manual compression. A dressing was placed. The patient tolerated the procedure well without immediate post procedural complication.  FINDINGS: The IVC is patent. No evidence of thrombus, stenosis, or occlusion. No variant venous anatomy. Successful placement of the IVC filter below the level of the renal veins.  IMPRESSION: Successful ultrasound and fluoroscopically guided placement of an infrarenal retrievable IVC filter.  PLAN: This IVC filter is potentially retrievable. The patient will be assessed for filter retrieval by Interventional Radiology in approximately 8-12 weeks. Further recommendations regarding filter retrieval, continued surveillance or declaration of device permanence,  will be made at that time.   Electronically Signed   By: Sandi Mariscal M.D.   On: 08/27/2014 19:03   Dg Chest Port 1 View  08/28/2014   CLINICAL DATA:  Pleural effusion  EXAM: PORTABLE CHEST - 1 VIEW  COMPARISON:  08/26/2014  FINDINGS: Elevation of the right hemidiaphragm with small right-sided pleural effusion is again noted. The cardiac shadow remains enlarged. The lungs are otherwise clear.  IMPRESSION: Stable changes on the right.  No acute abnormality is noted.   Electronically Signed   By: Inez Catalina M.D.   On: 08/28/2014 08:04   Dg Chest Port 1 View  08/27/2014   CLINICAL DATA:  Hypoxia. Respiratory distress. Shortness of breath.  EXAM: PORTABLE CHEST - 1 VIEW  COMPARISON:  Chest 08/22/2014.  CT chest 08/22/2014.  FINDINGS: Shallow inspiration with elevation of right hemidiaphragm. Focal infiltration in the right lung base medially corresponding to consolidation seen on previous CT. This may represent focal pneumonia. Cardiac enlargement. No pulmonary vascular congestion. No pneumothorax. Left lung is grossly clear.  IMPRESSION: Cardiac enlargement. Persistent airspace infiltration  in the right cardiophrenic angle. Elevation of right hemidiaphragm.   Electronically Signed   By: Lucienne Capers M.D.   On: 08/27/2014 02:15     Medications:     Scheduled Medications: . azithromycin  250 mg Oral Daily  . carvedilol  3.125 mg Oral BID WC  . clopidogrel  75 mg Oral Daily  . digoxin  0.125 mg Oral Daily  . losartan  12.5 mg Oral Daily  . pantoprazole  40 mg Oral Daily  . rosuvastatin  10 mg Oral q1800  . sodium chloride  3 mL Intravenous Q12H  . Warfarin - Pharmacist Dosing Inpatient   Does not apply q1800    Infusions: . heparin 2,200 Units/hr (08/28/14 0900)    PRN Medications: sodium chloride, acetaminophen, guaiFENesin-codeine, ipratropium-albuterol, ondansetron (ZOFRAN) IV, sodium chloride, traZODone, zolpidem   Assessment:   1. Acute on chronic respiratory failure -  multifactorial 2. Acute bilateral PE with significant clot burden with RV strain on echo    --s/p IVC filter 2/26 3. Chronic systolic HF. EF 20% 4. Acute R DVT 5. CAD with anterior STEMI s/p PCI LAD with DES 06/12/14    -c/p shock and VT requiring IABP and DC-CV 6. Ankylosing spondylitis 7. Hypotension 8. Hemoptysis  Plan/Discussion:    Somewhat improved today but still with hemoptysis - ? Possible pulmonary infarct. INR 1.77. Remains on heparin. Volume status looks ok. Given RV strain would continue to hold diuretics. Repeat CXR. Off ASA. Continue Plavix/warfarin/heparin.  Keep in SDU.   Length of Stay: 2   Scott [name] 08/28/2014, 1:30 PM  Advanced Heart Failure Team Pager [phone] (M-F; 7a - 4p)  Please contact [facility] for night-coverage after hours (4p -7a ) and weekends on [email or web address]

## 2014-08-29 ENCOUNTER — Inpatient Hospital Stay (HOSPITAL_COMMUNITY): Payer: Medicare Other

## 2014-08-29 DIAGNOSIS — J810 Acute pulmonary edema: Secondary | ICD-10-CM

## 2014-08-29 LAB — CBC
HCT: 32.9 % — ABNORMAL LOW (ref 39.0–52.0)
Hemoglobin: 10.6 g/dL — ABNORMAL LOW (ref 13.0–17.0)
MCH: 29.4 pg (ref 26.0–34.0)
MCHC: 32.2 g/dL (ref 30.0–36.0)
MCV: 91.4 fL (ref 78.0–100.0)
PLATELETS: 400 10*3/uL (ref 150–400)
RBC: 3.6 MIL/uL — ABNORMAL LOW (ref 4.22–5.81)
RDW: 16.3 % — AB (ref 11.5–15.5)
WBC: 17.8 10*3/uL — AB (ref 4.0–10.5)

## 2014-08-29 LAB — BASIC METABOLIC PANEL
Anion gap: 9 (ref 5–15)
BUN: 22 mg/dL (ref 6–23)
CALCIUM: 8.7 mg/dL (ref 8.4–10.5)
CHLORIDE: 103 mmol/L (ref 96–112)
CO2: 28 mmol/L (ref 19–32)
Creatinine, Ser: 0.94 mg/dL (ref 0.50–1.35)
GFR calc Af Amer: >90 mL/min (ref >90)
GFR, EST NON AFRICAN AMERICAN: 85 mL/min — AB (ref >90)
Glucose, Bld: 101 mg/dL — ABNORMAL HIGH (ref 70–99)
POTASSIUM: 4.2 mmol/L (ref 3.5–5.1)
SODIUM: 140 mmol/L (ref 135–145)

## 2014-08-29 LAB — HEPARIN LEVEL (UNFRACTIONATED)
HEPARIN UNFRACTIONATED: 0.7 [IU]/mL (ref 0.30–0.70)
HEPARIN UNFRACTIONATED: 0.85 [IU]/mL — AB (ref 0.30–0.70)

## 2014-08-29 LAB — PROTIME-INR
INR: 1.99 — AB (ref 0.00–1.49)
Prothrombin Time: 22.7 seconds — ABNORMAL HIGH (ref 11.6–15.2)

## 2014-08-29 MED ORDER — WARFARIN SODIUM 5 MG PO TABS
5.0000 mg | ORAL_TABLET | Freq: Once | ORAL | Status: DC
  Filled 2014-08-29: qty 1

## 2014-08-29 MED ORDER — FUROSEMIDE 10 MG/ML IJ SOLN
20.0000 mg | Freq: Once | INTRAMUSCULAR | Status: AC
  Administered 2014-08-29: 20 mg via INTRAVENOUS
  Filled 2014-08-29: qty 2

## 2014-08-29 MED ORDER — WARFARIN SODIUM 4 MG PO TABS
4.0000 mg | ORAL_TABLET | Freq: Once | ORAL | Status: AC
  Administered 2014-08-29: 4 mg via ORAL
  Filled 2014-08-29: qty 1

## 2014-08-29 MED ORDER — HEPARIN (PORCINE) IN NACL 100-0.45 UNIT/ML-% IJ SOLN
1900.0000 [IU]/h | INTRAMUSCULAR | Status: DC
  Administered 2014-08-30: 2000 [IU]/h via INTRAVENOUS
  Filled 2014-08-29 (×4): qty 250

## 2014-08-29 NOTE — Progress Notes (Signed)
ANTICOAGULATION CONSULT NOTE - FOLLOW UP    HL = 0.85 (goal 0.3 - 0.7 units/mL) Heparin dosing weight = 87 kg   Assessment: 68 YOM on IV heparin and Coumadin (overlap D# 3/5) for new bilateral PE and RLE DVT.  Heparin level now supra-therapeutic.  No bleeding reported and heparin lab drawn appropriately per RN.     Plan: - Decrease heparin gtt to 2000 units/hr - Check 6 hr HL   Kirke Breach D. Mina Marble, PharmD, BCPS Pager:  [phone] 08/29/2014, 7:00 PM

## 2014-08-29 NOTE — Progress Notes (Signed)
Pt report given to 2W RN, belongs packed to go with patient to new room. VSS-at patients baseline. Pt will transfer to new room on O2 2L/[place].  CMT and elink notified. Will continue to monitor until patient arrives to new room.

## 2014-08-29 NOTE — Progress Notes (Signed)
Advanced Heart Failure Rounding Note   Subjective:    69 year old male with history of CAD with stenting of LCx 2013 with recent history of STEMI 06/12/2014 s/p PCI to LAD complicated by post-cath shock required IABP then developed VT s/p DCCV, EF 20%, NSTEMI 06/26/2014 treated medically, DM2, acute respiratory failure, carotid stenting, and ankylosing spondylitis who presented to [facility] 08/22/2013 with 5 day history of worsening SOB, weight gain, and was found to have bilateral PE and RLE DVT.  Echo EF 20% with RV strain. Transfer to [facility] 2/25. CCM called 2/25 for worsening hypoxia. On heparin and coumadin.   He is s/p IVC filter 2/26. Feels better. No further hemoptysis. Breathing better. INR 1.99. Weight stable.    Objective:   Weight Range:  Vital Signs:   Temp:  [97.4 F (36.3 C)-98.5 F (36.9 C)] 98.4 F (36.9 C) (02/28 1146) Pulse Rate:  [72-80] 72 (02/28 1146) Resp:  [21-28] 23 (02/28 1146) BP: (83-129)/(34-82) 83/57 mmHg (02/28 1146) SpO2:  [91 %-98 %] 98 % (02/28 1146) Weight:  [91.1 kg (200 lb 13.4 oz)] 91.1 kg (200 lb 13.4 oz) (02/28 0336) Last BM Date: 08/28/14  Weight change: Filed Weights   08/27/14 0400 08/28/14 0353 08/29/14 0336  Weight: 92.1 kg (203 lb 0.7 oz) 91.3 kg (201 lb 4.5 oz) 91.1 kg (200 lb 13.4 oz)    Intake/Output:   Intake/Output Summary (Last 24 hours) at 08/29/14 1313 Last data filed at 08/29/14 1100  Gross per 24 hour  Intake    604 ml  Output    850 ml  Net   -246 ml     Physical Exam: General:  Sitting in bed. Mildly tachypneic HEENT: normal Neck: supple. JVP 9Carotids 2+ bilat; no bruits. No lymphadenopathy or thryomegaly appreciated. Cor: PMI  Laterally displaced. Regular rate & rhythm. No rubs, gallops or murmurs. Lungs: clear  Abdomen: soft, nontender, nondistended. No hepatosplenomegaly. No bruits or masses. Good bowel sounds. Extremities: no cyanosis, clubbing, rash, 1-2+ edema Neuro: alert & orientedx3, cranial nerves grossly  intact. moves all 4 extremities w/o difficulty. Affect pleasant  Telemetry: SR 70-80s  Labs: Basic Metabolic Panel:  Recent Labs Lab 08/27/14 0025 08/27/14 0253 08/28/14 0304 08/29/14 0400  NA 134* 134* 137 140  K 4.2 3.9 4.0 4.2  CL 101 98 101 103  CO2 24 27 30 28   GLUCOSE 140* 177* 153* 101*  BUN 31* 30* 24* 22  CREATININE 1.21 1.20 0.99 0.94  CALCIUM 8.3* 8.2* 8.2* 8.7  MG  --  2.0  --   --     Liver Function Tests:  Recent Labs Lab 08/27/14 0253  AST 25  ALT 30  ALKPHOS 217*  BILITOT 0.9  PROT 6.1  ALBUMIN 2.6*   No results for input(s): LIPASE, AMYLASE in the last 168 hours. No results for input(s): AMMONIA in the last 168 hours.  CBC:  Recent Labs Lab 08/27/14 0025 08/27/14 0253 08/28/14 0304 08/29/14 0400  WBC 22.4* 22.1* 18.3* 17.8*  HGB 10.5* 10.2* 10.3* 10.6*  HCT 33.0* 32.0* 32.7* 32.9*  MCV 92.2 92.5 91.6 91.4  PLT 430* 416* 400 400    Cardiac Enzymes:  Recent Labs Lab 08/27/14 0025  TROPONINI 0.12*    BNP: BNP (last 3 results)  Recent Labs  07/20/14 1053 08/27/14 0253  BNP 773.6* 1971.6*    ProBNP (last 3 results) No results for input(s): PROBNP in the last 8760 hours.    Other results:    Imaging: [place] Chest  08/27/2014   CLINICAL DATA:  Acute respiratory failure with hypoxia. Pleural effusion.  EXAM: CHEST ULTRASOUND  COMPARISON:  None.  FINDINGS: Ultrasound imaging of the right hemithorax shows the presence of a small right pleural effusion. No plural effusion is seen in the left lower hemithorax.  IMPRESSION: Small right-sided pleural effusion.   Electronically Signed   By: Earle Gell M.D.   On: 08/27/2014 14:27   Ir Ivc Filter Plmt / S&i /img Guid/mod Sed  08/27/2014   INDICATION: History of pulmonary embolism and right lower extremity DVT. Patient with history of ischemic cardiomyopathy. Request made for placement of an IVC filter to help to prevent additional lower extremity clot from migrating to the lungs and  further worsening patient's right-sided heart failure.  EXAM: ULTRASOUND GUIDANCE FOR VASCULAR ACCESS  IVC CATHETERIZATION AND VENOGRAM  IVC FILTER INSERTION  MEDICATIONS: Fentanyl 25 mcg IV; Versed 0.5 mg IV  ANESTHESIA/SEDATION: Sedation Time  7 minutes  CONTRAST:  85mL OMNIPAQUE IOHEXOL 300 MG/ML  SOLN  FLUOROSCOPY TIME:  1 minutes (28.3 mGy)  COMPLICATIONS: None immediate  PROCEDURE: Informed written consent was obtained from the patient following explanation of the procedure, risks, benefits and alternatives. A time out was performed prior to the initiation of the procedure. Maximal barrier sterile technique utilized including caps, mask, sterile gowns, sterile gloves, large sterile drape, hand hygiene, and Betadine prep.  Under sterile condition and local anesthesia, right common femoral venous access was performed with ultrasound. An ultrasound image was saved and sent to PACS. Over a guidewire, the IVC filter delivery sheath and inner dilator were advanced into the IVC just above the IVC bifurcation. Contrast injection was performed for an IVC venogram.  Through the delivery sheath, a retrievable Denali IVC filter was deployed below the level of the renal veins and above the IVC bifurcation. Limited post deployment venacavagram was performed.  The delivery sheath was removed and hemostasis was obtained with manual compression. A dressing was placed. The patient tolerated the procedure well without immediate post procedural complication.  FINDINGS: The IVC is patent. No evidence of thrombus, stenosis, or occlusion. No variant venous anatomy. Successful placement of the IVC filter below the level of the renal veins.  IMPRESSION: Successful ultrasound and fluoroscopically guided placement of an infrarenal retrievable IVC filter.  PLAN: This IVC filter is potentially retrievable. The patient will be assessed for filter retrieval by Interventional Radiology in approximately 8-12 weeks. Further recommendations  regarding filter retrieval, continued surveillance or declaration of device permanence, will be made at that time.   Electronically Signed   By: Sandi Mariscal M.D.   On: 08/27/2014 19:03   Dg Chest Port 1 View  08/29/2014   CLINICAL DATA:  Hemoptysis.  EXAM: PORTABLE CHEST - 1 VIEW  COMPARISON:  08/28/2014  FINDINGS: The heart is enlarged. There is increased opacity in the right lung, consistent with a fusion and significant basilar opacity, either atelectasis or infiltrate or infarct. Suspect mild interstitial edema. Left lung is essentially clear.  IMPRESSION: 1. Cardiomegaly and mild interstitial edema. 2. Increased opacity in the right upper lobe and increased pleural effusion.   Electronically Signed   By: Nolon Nations M.D.   On: 08/29/2014 09:04   Dg Chest Port 1 View  08/28/2014   CLINICAL DATA:  Pleural effusion  EXAM: PORTABLE CHEST - 1 VIEW  COMPARISON:  08/26/2014  FINDINGS: Elevation of the right hemidiaphragm with small right-sided pleural effusion is again noted. The cardiac shadow remains enlarged. The lungs are otherwise  clear.  IMPRESSION: Stable changes on the right.  No acute abnormality is noted.   Electronically Signed   By: Inez Catalina M.D.   On: 08/28/2014 08:04     Medications:     Scheduled Medications: . azithromycin  250 mg Oral Daily  . carvedilol  3.125 mg Oral BID WC  . clopidogrel  75 mg Oral Daily  . digoxin  0.125 mg Oral Daily  . losartan  12.5 mg Oral Daily  . pantoprazole  40 mg Oral Daily  . rosuvastatin  10 mg Oral q1800  . sodium chloride  3 mL Intravenous Q12H  . warfarin  4 mg Oral ONCE-1800  . Warfarin - Pharmacist Dosing Inpatient   Does not apply q1800    Infusions: . heparin 2,200 Units/hr (08/29/14 1100)    PRN Medications: sodium chloride, acetaminophen, guaiFENesin-codeine, ipratropium-albuterol, ondansetron (ZOFRAN) IV, sodium chloride, traZODone, zolpidem   Assessment:   1. Acute on chronic respiratory failure -  multifactorial 2. Acute bilateral PE with significant clot burden with RV strain on echo    --s/p IVC filter 2/26 3. Chronic systolic HF. EF 20% 4. Acute R DVT 5. CAD with anterior STEMI s/p PCI LAD with DES 06/12/14    -c/p shock and VT requiring IABP and DC-CV 6. Ankylosing spondylitis 7. Hypotension 8. Hemoptysis  Plan/Discussion:    Somewhat improved today but still a bit tenuous. Hemoptysis has resolved. INR now 1.99. Volume status up. Continue heparin/coumadin overlap for another 24 hours. Will give lasix 20 IV today. Can go to tele.   Off ASA. Continue Plavix/warfarin/heparin. Limit indomethacin with anticoagulation.  Length of Stay: 3   [name] MD 08/29/2014, 1:13 PM  Advanced Heart Failure Team Pager [phone] (M-F; [place])  Please contact [facility] for night-coverage after hours (4p -7a ) and weekends on [email or web address]

## 2014-08-29 NOTE — Progress Notes (Signed)
ANTICOAGULATION CONSULT NOTE - Follow Up Consult  Pharmacy Consult:  Heparin Indication:  Bilateral PE + RLE DVT  No Active Allergies  Patient Measurements: Height: 5\' 8"  (172.7 cm) Weight: 200 lb 13.4 oz (91.1 kg) IBW/kg (Calculated) : 68.4  Heparin Dosing Weight: 87 kg  Vital Signs: Temp: 98.4 F (36.9 C) (02/28 1146) Temp Source: Oral (02/28 1146) BP: 83/57 mmHg (02/28 1146) Pulse Rate: 72 (02/28 1146)  Labs:  Recent Labs  08/27/14 0025 08/27/14 0253 08/27/14 1000 08/28/14 0304 08/28/14 1012 08/29/14 0400 08/29/14 0945  HGB 10.5* 10.2*  --  10.3*  --  10.6*  --   HCT 33.0* 32.0*  --  32.7*  --  32.9*  --   PLT 430* 416*  --  400  --  400  --   APTT  --   --  74*  --   --   --   --   LABPROT  --   --  18.9* 20.8*  --  22.7*  --   INR  --   --  1.57* 1.77*  --  1.99*  --   HEPARINUNFRC 0.40 0.29* 0.14* 0.31 0.43  --  0.70  CREATININE 1.21 1.20  --  0.99  --  0.94  --   TROPONINI 0.12*  --   --   --   --   --   --     Estimated Creatinine Clearance: 84.7 mL/min (by C-G formula based on Cr of 0.94).  Assessment: 15 YOM to continue on IV heparin and Coumadin for new bilateral PE and RLE DVT.  Patient's heparin level is therapeutic.  His INR is trending up towards goal.  Noted documentation of low volume hemoptysis.  He continues on azithromycin for HCAP.  H/H stable but low, plt wnl.  No reports of bleeding at this time.    Goal of Therapy:  INR 2-3 Heparin level 0.3-0.7 units/ml  Monitor platelets by anticoagulation protocol: Yes   Plan:  - Continue heparin gtt at 2200 units/hr - Heparin level @ 1600 due to significant rise - Coumadin 4 mg PO x 1 tonight - Daily PT / INR / CBC / HL - Monitor closely for s/sx of bleeding  Hassie Bruce, Pharm. D. Clinical Pharmacy Resident Pager: [phone] Ph: [phone] 08/29/2014 12:15 PM

## 2014-08-30 LAB — BASIC METABOLIC PANEL
Anion gap: 6 (ref 5–15)
BUN: 22 mg/dL (ref 6–23)
CALCIUM: 8.3 mg/dL — AB (ref 8.4–10.5)
CHLORIDE: 101 mmol/L (ref 96–112)
CO2: 33 mmol/L — ABNORMAL HIGH (ref 19–32)
CREATININE: 1.12 mg/dL (ref 0.50–1.35)
GFR calc non Af Amer: 67 mL/min — ABNORMAL LOW (ref >90)
GFR, EST AFRICAN AMERICAN: 77 mL/min — AB (ref >90)
Glucose, Bld: 143 mg/dL — ABNORMAL HIGH (ref 70–99)
Potassium: 3.7 mmol/L (ref 3.5–5.1)
Sodium: 140 mmol/L (ref 135–145)

## 2014-08-30 LAB — HEPARIN LEVEL (UNFRACTIONATED)
HEPARIN UNFRACTIONATED: 0.7 [IU]/mL (ref 0.30–0.70)
Heparin Unfractionated: 0.72 IU/mL — ABNORMAL HIGH (ref 0.30–0.70)
Heparin Unfractionated: >2.2 IU/mL — ABNORMAL HIGH (ref 0.30–0.70)

## 2014-08-30 LAB — PROTIME-INR
INR: 2.11 — ABNORMAL HIGH (ref 0.00–1.49)
PROTHROMBIN TIME: 23.8 s — AB (ref 11.6–15.2)

## 2014-08-30 LAB — CLOSTRIDIUM DIFFICILE BY PCR: Toxigenic C. Difficile by PCR: NEGATIVE

## 2014-08-30 MED ORDER — SPIRONOLACTONE 12.5 MG HALF TABLET
12.5000 mg | ORAL_TABLET | Freq: Every day | ORAL | Status: DC
  Administered 2014-08-30 – 2014-09-02 (×4): 12.5 mg via ORAL
  Filled 2014-08-30 (×4): qty 1

## 2014-08-30 MED ORDER — FUROSEMIDE 10 MG/ML IJ SOLN
20.0000 mg | Freq: Once | INTRAMUSCULAR | Status: AC
  Administered 2014-08-30: 20 mg via INTRAVENOUS
  Filled 2014-08-30: qty 2

## 2014-08-30 MED ORDER — COUMADIN BOOK
Freq: Once | Status: AC
  Administered 2014-08-30: 16:00:00
  Filled 2014-08-30: qty 1

## 2014-08-30 MED ORDER — WARFARIN SODIUM 4 MG PO TABS
4.0000 mg | ORAL_TABLET | Freq: Once | ORAL | Status: AC
  Administered 2014-08-30: 4 mg via ORAL
  Filled 2014-08-30: qty 1

## 2014-08-30 MED ORDER — WARFARIN VIDEO
Freq: Once | Status: AC
  Administered 2014-08-30: 16:00:00

## 2014-08-30 NOTE — Progress Notes (Signed)
ANTICOAGULATION CONSULT NOTE - Follow Up Consult  Pharmacy Consult:  Heparin and Warfarin Indication:  Bilateral PE + RLE DVT  No Active Allergies  Patient Measurements: Height: 5\' 8"  (172.7 cm) Weight: 200 lb 13.4 oz (91.1 kg) IBW/kg (Calculated) : 68.4  Heparin Dosing Weight: 87 kg  Vital Signs: Temp: 97.8 F (36.6 C) (02/29 0440) Temp Source: Oral (02/29 0440) BP: 90/59 mmHg (02/29 0440) Pulse Rate: 71 (02/29 0440)  Labs:  Recent Labs  08/27/14 1000 08/28/14 0304  08/29/14 0400  08/29/14 1816 08/30/14 0042 08/30/14 0416 08/30/14 0654  HGB  --  10.3*  --  10.6*  --   --   --   --   --   HCT  --  32.7*  --  32.9*  --   --   --   --   --   PLT  --  400  --  400  --   --   --   --   --   APTT 74*  --   --   --   --   --   --   --   --   LABPROT 18.9* 20.8*  --  22.7*  --   --   --  23.8*  --   INR 1.57* 1.77*  --  1.99*  --   --   --  2.11*  --   HEPARINUNFRC 0.14* 0.31  < >  --   < > 0.85* 0.70  --  0.72*  CREATININE  --  0.99  --  0.94  --   --   --  1.12  --   < > = values in this interval not displayed.  Estimated Creatinine Clearance: 71.1 mL/min (by C-G formula based on Cr of 1.12).  Assessment: 43 YOM to continue on IV heparin and Coumadin for new bilateral PE and RLE DVT.  Patient's heparin level is therapeutic.  His INR is trending up towards goal.  Noted documentation of low volume hemoptysis.  He continues on azithromycin for HCAP.  H/H stable but low, plt wnl.  No reports of bleeding at this time.    HL 0.72 on heparin 2000 units/hr. INR therapeutic at 2.11. No bleeding is noted.  Goal of Therapy:  INR 2-3 Heparin level 0.3-0.7 units/ml  Monitor platelets by anticoagulation protocol: Yes   Plan:  - Decrease heparin 1900 units/hr - 6h HL - Coumadin 4 mg PO x 1 tonight - Daily INR/CBC/HL - Monitor for s/sx of bleeding  Andrey Cota. Diona Foley, PharmD Clinical Pharmacist Pager [phone]  08/30/2014 9:07 AM

## 2014-08-30 NOTE — Progress Notes (Signed)
Medicare Important Message given? YES  (If response is "NO", the following Medicare IM given date fields will be blank)  Date Medicare IM given: 08/30/14 Medicare IM given by:  Dahlia Client [employer]

## 2014-08-30 NOTE — Progress Notes (Signed)
PULMONARY / CRITICAL CARE MEDICINE   Name: Scott [name] MRN: [id number] DOB: 12/22/1945    ADMISSION DATE:  08/26/2014 CONSULTATION DATE:  08/26/14  REFERRING MD :  Scott Faith PA-C  CHIEF COMPLAINT:  hypoxia  INITIAL PRESENTATION: 69 y/o male with history of CAD with stenting of LCx 2013 with recent history of STEMI 06/12/2014 s/p PCI to LAD complicated by post-cath shock required IABP then developed VT s/p DCCV, EF 20%, NSTEMI 06/26/2014 treated medically, DM2, acute respiratory failure, carotid stenting, and ankylosing spondylitis who presented to [facility] 08/22/2013 with 5 day history of worsening SOB, weight gain, and was found to have bilateral PE / RLE DVT. Transfer to [facility] 2/25. CCM called 2/25 for worsening hypoxia.   STUDIES:  LE dopplers from OSH: Right leg DVT  CT Chest from OSH: bilateral diffuse PEs w/o overt signs of right heart strain ECHO 12/12 >>EF 20%; mid Lventricle and apex is akinetic ECHO 12/21 >> Echo showed EF <20%, severely decreased global systolic LV function, dilated CM, moderate MR/TR  SIGNIFICANT EVENTS: 12/12  STEMI s/p PCI to LAD complicated by post-cath shock required IABP then developed VT s/p DCCV 12/26  Admitted to [facility] for NSTEMI with peak troponin of 2.50 02/26  IVC filter placement  02/27  Not required bipap, CXR with mild elevation of R HD, very small effusion   SUBJECTIVE:  "I feel awful".  Reports tired, did not rest overnight.  Indicates SOB at baseline. Reports hemoptysis improved / resolved.   VITAL SIGNS: Temp:  [97.5 F (36.4 C)-98.6 F (37 C)] 97.8 F (36.6 C) (02/29 0440) Pulse Rate:  [71-86] 71 (02/29 0440) Resp:  [18-23] 20 (02/29 0440) BP: (83-91)/(54-59) 90/59 mmHg (02/29 0440) SpO2:  [91 %-98 %] 94 % (02/29 0440)   INTAKE / OUTPUT:  Intake/Output Summary (Last 24 hours) at 08/30/14 0953 Last data filed at 08/30/14 0900  Gross per 24 hour  Intake    404 ml  Output    375 ml  Net     29 ml    PHYSICAL  EXAMINATION: General: weak appearing male in nad HEENT: nose without purulence or d/c noted Lungs: even/non-labored at rest, lungs bilaterally without wheeze, mild upper airway noise Heart: regular, no murmur Abdomen: Soft, non tender Msk:no acute deformities, 1+ pitting LE edema Neuro: awake, alert, moves all 4.   LABS:  CBC  Recent Labs Lab 08/27/14 0253 08/28/14 0304 08/29/14 0400  WBC 22.1* 18.3* 17.8*  HGB 10.2* 10.3* 10.6*  HCT 32.0* 32.7* 32.9*  PLT 416* 400 400   BMET  Recent Labs Lab 08/28/14 0304 08/29/14 0400 08/30/14 0416  NA 137 140 140  K 4.0 4.2 3.7  CL 101 103 101  CO2 30 28 33*  BUN 24* 22 22  CREATININE 0.99 0.94 1.12  GLUCOSE 153* 101* 143*    Imaging Dg Chest Port 1 View  08/29/2014   CLINICAL DATA:  Hemoptysis.  EXAM: PORTABLE CHEST - 1 VIEW  COMPARISON:  08/28/2014  FINDINGS: The heart is enlarged. There is increased opacity in the right lung, consistent with a fusion and significant basilar opacity, either atelectasis or infiltrate or infarct. Suspect mild interstitial edema. Left lung is essentially clear.  IMPRESSION: 1. Cardiomegaly and mild interstitial edema. 2. Increased opacity in the right upper lobe and increased pleural effusion.   Electronically Signed   By: Nolon Nations M.D.   On: 08/29/2014 09:04     ASSESSMENT / PLAN:  Bilateral PE - on heparin gtt with coumadin  overlap. D5/5 heparin/coumadin.  S/P IVC Filter placement 2/26.  RLE DVT Acute Hypoxic Respiratory Failure - in setting of acute PE, pleural effusions, ATX & chronic sCHF.  Hemoptysis - secondary to anticoagulation, resolved.  Small Bilateral Pleural Effusions - assessed per IR, small volume / not enough for thora RUL Airspace Disease  Systolic CHF   PLAN: -Duoneb PRN -no further BiPAP -Intermittent CXR -Monitor for further hemoptysis -Heparin / coumadin per pharmacy.  Patient will likely need minimum of 6 months duration.   -will need determine timing for  IVC removal per IR  -D4/x zithromax -negative to even balance -PPI  Scott Gens, NP-C [name] Pgr: [phone] or [phone]   Attending  I have seen and examined the patient with nurse practitioner/resident and agree with the note above.   [name] looks great on exam today, lungs clear oxygenating well  This case is a reminder that most people who are critically ill from a PE don't need thrombolytics right away!    Acute PE, provoked> needs 3-6 months of warfarin IVC filter> needs to be removed within 3-6 months Hemoptysis> seems to be resolving, likely some degree of pulmonary infarct Diarrhea > hopefully not c.diff, test pending  I have made arrangements for him to f/u up with me  Scott Awkward, MD [place] [facility] Pager: [phone] Cell: [phone] If no response, call [phone]

## 2014-08-30 NOTE — Progress Notes (Addendum)
Advanced Heart Failure Rounding Note   Subjective:    69 year old male with history of CAD with stenting of LCx 2013 with recent history of STEMI 06/12/2014 s/p PCI to LAD complicated by post-cath shock required IABP then developed VT s/p DCCV, EF 20%, NSTEMI 06/26/2014 treated medically, DM2, acute respiratory failure, carotid stenting, and ankylosing spondylitis who presented to [facility] 08/22/2013 with 5 day history of worsening SOB, weight gain, and was found to have bilateral PE and RLE DVT.  Echo EF 20% with RV strain. Transfer to [facility] 2/25. CCM called 2/25 for worsening hypoxia. On heparin and coumadin.   He is s/p IVC filter 2/26. Feels better. No further hemoptysis. Walking hall slowly. Good diuresis with IV lasix yesterday.    Objective:   Weight Range:  Vital Signs:   Temp:  [97.5 F (36.4 C)-98.6 F (37 C)] 98 F (36.7 C) (02/29 1339) Pulse Rate:  [71-88] 88 (02/29 1339) Resp:  [18-20] 18 (02/29 1339) BP: (87-97)/(51-68) 91/68 mmHg (02/29 1339) SpO2:  [91 %-96 %] 94 % (02/29 1339) Last BM Date: 08/30/14  Weight change: Filed Weights   08/27/14 0400 08/28/14 0353 08/29/14 0336  Weight: 92.1 kg (203 lb 0.7 oz) 91.3 kg (201 lb 4.5 oz) 91.1 kg (200 lb 13.4 oz)    Intake/Output:   Intake/Output Summary (Last 24 hours) at 08/30/14 1544 Last data filed at 08/30/14 1230  Gross per 24 hour  Intake    600 ml  Output    525 ml  Net     75 ml     Physical Exam: General:  Walking hall slowy  HEENT: normal Neck: supple. JVP 9 Carotids 2+ bilat; no bruits. No lymphadenopathy or thryomegaly appreciated. Cor: PMI  Laterally displaced. Regular rate & rhythm. No rubs, gallops or murmurs. Lungs: clear  Abdomen: soft, nontender, nondistended. No hepatosplenomegaly. No bruits or masses. Good bowel sounds. Extremities: no cyanosis, clubbing, rash, 2+ edema Neuro: alert & orientedx3, cranial nerves grossly intact. moves all 4 extremities w/o difficulty. Affect pleasant  Telemetry: SR  70-80s  Labs: Basic Metabolic Panel:  Recent Labs Lab 08/27/14 0025 08/27/14 0253 08/28/14 0304 08/29/14 0400 08/30/14 0416  NA 134* 134* 137 140 140  K 4.2 3.9 4.0 4.2 3.7  CL 101 98 101 103 101  CO2 24 27 30 28  33*  GLUCOSE 140* 177* 153* 101* 143*  BUN 31* 30* 24* 22 22  CREATININE 1.21 1.20 0.99 0.94 1.12  CALCIUM 8.3* 8.2* 8.2* 8.7 8.3*  MG  --  2.0  --   --   --     Liver Function Tests:  Recent Labs Lab 08/27/14 0253  AST 25  ALT 30  ALKPHOS 217*  BILITOT 0.9  PROT 6.1  ALBUMIN 2.6*   No results for input(s): LIPASE, AMYLASE in the last 168 hours. No results for input(s): AMMONIA in the last 168 hours.  CBC:  Recent Labs Lab 08/27/14 0025 08/27/14 0253 08/28/14 0304 08/29/14 0400  WBC 22.4* 22.1* 18.3* 17.8*  HGB 10.5* 10.2* 10.3* 10.6*  HCT 33.0* 32.0* 32.7* 32.9*  MCV 92.2 92.5 91.6 91.4  PLT 430* 416* 400 400    Cardiac Enzymes:  Recent Labs Lab 08/27/14 0025  TROPONINI 0.12*    BNP: BNP (last 3 results)  Recent Labs  07/20/14 1053 08/27/14 0253  BNP 773.6* 1971.6*    ProBNP (last 3 results) No results for input(s): PROBNP in the last 8760 hours.    Other results:    Imaging:  Dg Chest Port 1 View  08/29/2014   CLINICAL DATA:  Hemoptysis.  EXAM: PORTABLE CHEST - 1 VIEW  COMPARISON:  08/28/2014  FINDINGS: The heart is enlarged. There is increased opacity in the right lung, consistent with a fusion and significant basilar opacity, either atelectasis or infiltrate or infarct. Suspect mild interstitial edema. Left lung is essentially clear.  IMPRESSION: 1. Cardiomegaly and mild interstitial edema. 2. Increased opacity in the right upper lobe and increased pleural effusion.   Electronically Signed   By: Nolon Nations M.D.   On: 08/29/2014 09:04     Medications:     Scheduled Medications: . azithromycin  250 mg Oral Daily  . carvedilol  3.125 mg Oral BID WC  . clopidogrel  75 mg Oral Daily  . coumadin book   Does not  apply Once  . digoxin  0.125 mg Oral Daily  . furosemide  20 mg Intravenous Once  . losartan  12.5 mg Oral Daily  . pantoprazole  40 mg Oral Daily  . rosuvastatin  10 mg Oral q1800  . sodium chloride  3 mL Intravenous Q12H  . spironolactone  12.5 mg Oral Daily  . warfarin  4 mg Oral ONCE-1800  . warfarin   Does not apply Once  . Warfarin - Pharmacist Dosing Inpatient   Does not apply q1800    Infusions: . heparin 1,900 Units/hr (08/30/14 1033)    PRN Medications: sodium chloride, acetaminophen, guaiFENesin-codeine, ipratropium-albuterol, ondansetron (ZOFRAN) IV, sodium chloride, traZODone, zolpidem   Assessment:   1. Acute on chronic respiratory failure - multifactorial 2. Acute bilateral PE with significant clot burden with RV strain on echo    --s/p IVC filter 2/26 3. Chronic systolic HF. EF 20% 4. Acute R DVT 5. CAD with anterior STEMI s/p PCI LAD with DES 06/12/14    -c/p shock and VT requiring IABP and DC-CV 6. Ankylosing spondylitis 7. Hypotension 8. Hemoptysis  Plan/Discussion:    Improving slowly. Walking the hall with rest breaks. No CP.  Volume status up. Continue heparin/coumadin overlap for another 24 hours. Will give lasix 20 IV today.   Off ASA. Continue Plavix/warfarin/heparin. Limit indomethacin with anticoagulation.  Has made it clear he wants to be DNR/DNI should anything else happen. Pressors ok.   Length of Stay: 4   [name] MD 08/30/2014, 3:44 PM  Advanced Heart Failure Team Pager [phone] (M-F; 7a - 4p)  Please contact [facility] for night-coverage after hours (4p -7a ) and weekends on [email or web address]

## 2014-08-30 NOTE — Progress Notes (Signed)
ANTICOAGULATION CONSULT NOTE - FOLLOW UP  HL = 0.7 (goal 0.3 - 0.7 units/mL) Heparin dosing weight = 87 kg   Assessment: 68 YOM on IV heparin and Coumadin (overlap D# 4/5) for new bilateral PE and RLE DVT.  Heparin level therapeutic.  No bleeding reported.     Plan: Continue heparin gtt 2000 units/hr Check 6 hr HL to confirm.  Nicole Cella, RPh Clinical Pharmacist Pager: [phone] 08/30/2014, 1:18 AM

## 2014-08-30 NOTE — Progress Notes (Signed)
CARDIAC REHAB PHASE I   PRE:  Rate/Rhythm: 95 SR with OVC's  BP:  Supine:   Sitting: 86/39  Standing:    SaO2: 94 RA  MODE:  Ambulation: 356 ft   POST:  Rate/Rhythm: 93 Pacing  BP:  Supine:   Sitting: 99/62  Standing:    SaO2: 96 RA 1115-1200 Assisted X 1 to ambulate. Pt refused to use walker. He started out holding to IV pole. Pt then started using hand rail in hall. I explained to him that I preferred that he use walker. He sates that the handrail felt more sturdy. Pt was able to walk 356 feet. BP better after walk. He denies any dizziness walking, his only c/o was of tiredness.Pt to recliner after walk with call light in reach and wife present.  Rodney Langton RN 08/30/2014 12:00 PM

## 2014-08-31 ENCOUNTER — Inpatient Hospital Stay (HOSPITAL_COMMUNITY): Payer: Medicare Other

## 2014-08-31 LAB — BASIC METABOLIC PANEL
Anion gap: 11 (ref 5–15)
BUN: 17 mg/dL (ref 6–23)
CO2: 33 mmol/L — ABNORMAL HIGH (ref 19–32)
Calcium: 8 mg/dL — ABNORMAL LOW (ref 8.4–10.5)
Chloride: 98 mmol/L (ref 96–112)
Creatinine, Ser: 1.15 mg/dL (ref 0.50–1.35)
GFR calc Af Amer: 75 mL/min — ABNORMAL LOW (ref >90)
GFR, EST NON AFRICAN AMERICAN: 65 mL/min — AB (ref >90)
GLUCOSE: 101 mg/dL — AB (ref 70–99)
Potassium: 3.6 mmol/L (ref 3.5–5.1)
Sodium: 142 mmol/L (ref 135–145)

## 2014-08-31 LAB — PROTIME-INR
INR: 2.22 — AB (ref 0.00–1.49)
PROTHROMBIN TIME: 24.8 s — AB (ref 11.6–15.2)

## 2014-08-31 MED ORDER — LOPERAMIDE HCL 2 MG PO CAPS
2.0000 mg | ORAL_CAPSULE | ORAL | Status: DC | PRN
  Administered 2014-08-31 – 2014-09-01 (×4): 2 mg via ORAL
  Filled 2014-08-31 (×5): qty 1

## 2014-08-31 MED ORDER — WARFARIN VIDEO
Freq: Once | Status: AC
  Administered 2014-08-31: 16:00:00

## 2014-08-31 MED ORDER — POTASSIUM CHLORIDE CRYS ER 20 MEQ PO TBCR
40.0000 meq | EXTENDED_RELEASE_TABLET | Freq: Once | ORAL | Status: AC
  Administered 2014-08-31: 40 meq via ORAL
  Filled 2014-08-31: qty 2

## 2014-08-31 MED ORDER — FUROSEMIDE 40 MG PO TABS
40.0000 mg | ORAL_TABLET | Freq: Once | ORAL | Status: AC
  Administered 2014-08-31: 40 mg via ORAL
  Filled 2014-08-31: qty 1

## 2014-08-31 MED ORDER — WARFARIN SODIUM 4 MG PO TABS
4.0000 mg | ORAL_TABLET | Freq: Once | ORAL | Status: AC
  Administered 2014-08-31: 4 mg via ORAL
  Filled 2014-08-31: qty 1

## 2014-08-31 NOTE — Progress Notes (Signed)
Advanced Heart Failure Rounding Note   Subjective:    69 year old male with history of CAD with stenting of LCx 2013 with recent history of STEMI 06/12/2014 s/p PCI to LAD complicated by post-cath shock required IABP then developed VT s/p DCCV, EF 20%, NSTEMI 06/26/2014 treated medically, DM2, acute respiratory failure, carotid stenting, and ankylosing spondylitis who presented to [facility] 08/22/2013 with 5 day history of worsening SOB, weight gain, and was found to have bilateral PE and RLE DVT.  Echo EF 20% with RV strain. Transfer to [facility] 2/25. CCM called 2/25 for worsening hypoxia. On heparin and coumadin.   He is s/p IVC filter 2/26. Feels better. No further hemoptysis. Good diuresis with IV lasix yesterday. Weight down 8 pounds (?). INR therapeutic x 48 hours. Heparin off last night. C. Diff negative.   Objective:   Weight Range:  Vital Signs:   Temp:  [97.6 F (36.4 C)-98.2 F (36.8 C)] 97.6 F (36.4 C) (03/01 0446) Pulse Rate:  [82-88] 82 (03/01 0446) Resp:  [18] 18 (03/01 0446) BP: (90-97)/(51-68) 90/66 mmHg (03/01 0446) SpO2:  [94 %-96 %] 96 % (03/01 0446) Weight:  [89.2 kg (196 lb 10.4 oz)-92.625 kg (204 lb 3.2 oz)] 89.2 kg (196 lb 10.4 oz) (03/01 0446) Last BM Date: 08/30/14  Weight change: Filed Weights   08/29/14 0336 08/30/14 1900 08/31/14 0446  Weight: 91.1 kg (200 lb 13.4 oz) 92.625 kg (204 lb 3.2 oz) 89.2 kg (196 lb 10.4 oz)    Intake/Output:   Intake/Output Summary (Last 24 hours) at 08/31/14 0513 Last data filed at 08/30/14 1630  Gross per 24 hour  Intake    600 ml  Output   1050 ml  Net   -450 ml     Physical Exam: General:  Sitting on side of bed HEENT: normal Neck: supple. JVP 6-7 Carotids 2+ bilat; no bruits. No lymphadenopathy or thryomegaly appreciated. Cor: PMI  Laterally displaced. Regular rate & rhythm. No rubs, gallops or murmurs. Lungs: clear  Abdomen: soft, nontender, nondistended. No hepatosplenomegaly. No bruits or masses. Good bowel  sounds. Extremities: no cyanosis, clubbing, rash, 1+ edema Neuro: alert & orientedx3, cranial nerves grossly intact. moves all 4 extremities w/o difficulty. Affect pleasant  Telemetry: SR 70-80s  Labs: Basic Metabolic Panel:  Recent Labs Lab 08/27/14 0025 08/27/14 0253 08/28/14 0304 08/29/14 0400 08/30/14 0416  NA 134* 134* 137 140 140  K 4.2 3.9 4.0 4.2 3.7  CL 101 98 101 103 101  CO2 24 27 30 28  33*  GLUCOSE 140* 177* 153* 101* 143*  BUN 31* 30* 24* 22 22  CREATININE 1.21 1.20 0.99 0.94 1.12  CALCIUM 8.3* 8.2* 8.2* 8.7 8.3*  MG  --  2.0  --   --   --     Liver Function Tests:  Recent Labs Lab 08/27/14 0253  AST 25  ALT 30  ALKPHOS 217*  BILITOT 0.9  PROT 6.1  ALBUMIN 2.6*   No results for input(s): LIPASE, AMYLASE in the last 168 hours. No results for input(s): AMMONIA in the last 168 hours.  CBC:  Recent Labs Lab 08/27/14 0025 08/27/14 0253 08/28/14 0304 08/29/14 0400  WBC 22.4* 22.1* 18.3* 17.8*  HGB 10.5* 10.2* 10.3* 10.6*  HCT 33.0* 32.0* 32.7* 32.9*  MCV 92.2 92.5 91.6 91.4  PLT 430* 416* 400 400    Cardiac Enzymes:  Recent Labs Lab 08/27/14 0025  TROPONINI 0.12*    BNP: BNP (last 3 results)  Recent Labs  07/20/14 1053  08/27/14 0253  BNP 773.6* 1971.6*    ProBNP (last 3 results) No results for input(s): PROBNP in the last 8760 hours.    Other results:    Imaging: Dg Chest Port 1 View  08/29/2014   CLINICAL DATA:  Hemoptysis.  EXAM: PORTABLE CHEST - 1 VIEW  COMPARISON:  08/28/2014  FINDINGS: The heart is enlarged. There is increased opacity in the right lung, consistent with a fusion and significant basilar opacity, either atelectasis or infiltrate or infarct. Suspect mild interstitial edema. Left lung is essentially clear.  IMPRESSION: 1. Cardiomegaly and mild interstitial edema. 2. Increased opacity in the right upper lobe and increased pleural effusion.   Electronically Signed   By: Nolon Nations M.D.   On: 08/29/2014  09:04     Medications:     Scheduled Medications: . azithromycin  250 mg Oral Daily  . carvedilol  3.125 mg Oral BID WC  . clopidogrel  75 mg Oral Daily  . digoxin  0.125 mg Oral Daily  . losartan  12.5 mg Oral Daily  . pantoprazole  40 mg Oral Daily  . rosuvastatin  10 mg Oral q1800  . sodium chloride  3 mL Intravenous Q12H  . spironolactone  12.5 mg Oral Daily  . Warfarin - Pharmacist Dosing Inpatient   Does not apply q1800    Infusions:    PRN Medications: sodium chloride, acetaminophen, guaiFENesin-codeine, ipratropium-albuterol, ondansetron (ZOFRAN) IV, sodium chloride, traZODone, zolpidem   Assessment:   1. Acute on chronic respiratory failure - multifactorial 2. Acute bilateral PE with significant clot burden with RV strain on echo    --s/p IVC filter 2/26 3. Chronic systolic HF. EF 20% 4. Acute R DVT 5. CAD with anterior STEMI s/p PCI LAD with DES 06/12/14    -c/p shock and VT requiring IABP and DC-CV 6. Ankylosing spondylitis 7. Hypotension 8. Hemoptysis  Plan/Discussion:    Improving slowly. Off heparin. Await INR today. Off ASA. Continue Plavix/warfarin. Limit indomethacin with anticoagulation.  Will give one dose oral lasix today (he is not on routinely at home). Continue HF meds. Possibly home tomorrow or more likely Thursday as he gets stronger. Will need cardiac rehab at [facility] due to recent MI.  Has made it clear he wants to be DNR/DNI should anything else happen. Pressors ok. I have changed his chart.   Length of Stay: 5   [name] MD 08/31/2014, 5:13 AM  Advanced Heart Failure Team Pager [phone] (M-F; [place])  Please contact [facility] for night-coverage after hours (4p -7a ) and weekends on [email or web address]

## 2014-08-31 NOTE — Progress Notes (Signed)
1455 Cardiac Rehab Pt has walked 3 times today and has his fourth one planned at 4 pm. He states that it tires him to walk but he is very motivated this admission. We will follow pt to make sure he is walking and to see if we can get him to commit to [place]. CRP in [place]. Deon Pilling, RN 08/31/2014 3:14 PM

## 2014-08-31 NOTE — Progress Notes (Signed)
Utilization review completed.  

## 2014-08-31 NOTE — Progress Notes (Signed)
ANTICOAGULATION CONSULT NOTE - Follow Up Consult  Pharmacy Consult:  Warfarin Indication:  Bilateral PE + RLE DVT  No Active Allergies  Patient Measurements: Height: 5\' 8"  (172.7 cm) Weight: 196 lb 10.4 oz (89.2 kg) IBW/kg (Calculated) : 68.4   Vital Signs: Temp: 97.6 F (36.4 C) (03/01 0446) Temp Source: Oral (03/01 0446) BP: 94/52 mmHg (03/01 0741) Pulse Rate: 90 (03/01 0741)  Labs:  Recent Labs  08/29/14 0400  08/30/14 0042 08/30/14 0416 08/30/14 0654 08/30/14 1735 08/31/14 0427  HGB 10.6*  --   --   --   --   --   --   HCT 32.9*  --   --   --   --   --   --   PLT 400  --   --   --   --   --   --   LABPROT 22.7*  --   --  23.8*  --   --  24.8*  INR 1.99*  --   --  2.11*  --   --  2.22*  HEPARINUNFRC  --   < > 0.70  --  0.72* >2.20*  --   CREATININE 0.94  --   --  1.12  --   --  1.15  < > = values in this interval not displayed.  Estimated Creatinine Clearance: 68.5 mL/min (by C-G formula based on Cr of 1.15).  Assessment: 52 YOM to continue on Coumadin for new bilateral PE and RLE DVT >> heparin D/C yesterday now that INR therapeutic. Noted documentation of low volume hemoptysis, now resolved.  H/H stable but low, plt wnl.  No other reports of bleeding at this time.  INR today 2.22  Goal of Therapy:  INR 2-3 Monitor platelets by anticoagulation protocol: Yes   Plan:  - Coumadin 4 mg PO x 1 tonight - Daily INR/CBC - Monitor for s/sx of bleeding  Drucie Opitz, PharmD Clinical Pharmacy Resident Pager: [phone] 08/31/2014 10:24 AM

## 2014-09-01 LAB — CBC
HCT: 36.2 % — ABNORMAL LOW (ref 39.0–52.0)
HEMOGLOBIN: 11.4 g/dL — AB (ref 13.0–17.0)
MCH: 29.2 pg (ref 26.0–34.0)
MCHC: 31.5 g/dL (ref 30.0–36.0)
MCV: 92.8 fL (ref 78.0–100.0)
PLATELETS: 505 10*3/uL — AB (ref 150–400)
RBC: 3.9 MIL/uL — ABNORMAL LOW (ref 4.22–5.81)
RDW: 16.5 % — ABNORMAL HIGH (ref 11.5–15.5)
WBC: 16 10*3/uL — AB (ref 4.0–10.5)

## 2014-09-01 LAB — BASIC METABOLIC PANEL
ANION GAP: 10 (ref 5–15)
BUN: 16 mg/dL (ref 6–23)
CHLORIDE: 104 mmol/L (ref 96–112)
CO2: 26 mmol/L (ref 19–32)
Calcium: 8.3 mg/dL — ABNORMAL LOW (ref 8.4–10.5)
Creatinine, Ser: 1.09 mg/dL (ref 0.50–1.35)
GFR calc Af Amer: 80 mL/min — ABNORMAL LOW (ref >90)
GFR calc non Af Amer: 69 mL/min — ABNORMAL LOW (ref >90)
Glucose, Bld: 92 mg/dL (ref 70–99)
Potassium: 4.1 mmol/L (ref 3.5–5.1)
SODIUM: 140 mmol/L (ref 135–145)

## 2014-09-01 LAB — PROTIME-INR
INR: 2.3 — ABNORMAL HIGH (ref 0.00–1.49)
Prothrombin Time: 25.5 seconds — ABNORMAL HIGH (ref 11.6–15.2)

## 2014-09-01 MED ORDER — WARFARIN SODIUM 4 MG PO TABS
4.0000 mg | ORAL_TABLET | Freq: Once | ORAL | Status: AC
  Administered 2014-09-01: 4 mg via ORAL
  Filled 2014-09-01 (×2): qty 1

## 2014-09-01 MED ORDER — COUMADIN BOOK
Freq: Once | Status: AC
  Administered 2014-09-01: 10:00:00
  Filled 2014-09-01: qty 1

## 2014-09-01 NOTE — Progress Notes (Addendum)
Advanced Heart Failure Rounding Note   Subjective:    69 year old male with history of CAD with stenting of LCx 2013 with recent history of STEMI 06/12/2014 s/p PCI to LAD complicated by post-cath shock required IABP then developed VT s/p DCCV, EF 20%, NSTEMI 06/26/2014 treated medically, DM2, acute respiratory failure, carotid stenting, and ankylosing spondylitis who presented to [facility] 08/22/2013 with 5 day history of worsening SOB, weight gain, and was found to have bilateral PE and RLE DVT.  Echo EF 20% with RV strain. Transfer to [facility] 2/25. CCM called 2/25 for worsening hypoxia. On heparin and coumadin.   He is s/p IVC filter 2/26. Yesterday he was given po lasix. INR therapeutic.   Denies SOB.    Objective:   Weight Range:  Vital Signs:   Temp:  [97.4 F (36.3 C)-98.1 F (36.7 C)] 97.5 F (36.4 C) (03/02 0415) Pulse Rate:  [79-98] 79 (03/02 0415) Resp:  [18] 18 (03/02 0415) BP: (83-110)/(54-69) 110/60 mmHg (03/02 0516) SpO2:  [91 %-94 %] 91 % (03/02 0415) Weight:  [196 lb 4.8 oz (89.041 kg)] 196 lb 4.8 oz (89.041 kg) (03/02 0415) Last BM Date: 09/01/14  Weight change: Filed Weights   08/30/14 1900 08/31/14 0446 09/01/14 0415  Weight: 204 lb 3.2 oz (92.625 kg) 196 lb 10.4 oz (89.2 kg) 196 lb 4.8 oz (89.041 kg)    Intake/Output:   Intake/Output Summary (Last 24 hours) at 09/01/14 0839 Last data filed at 09/01/14 0600  Gross per 24 hour  Intake    480 ml  Output   2050 ml  Net  -1570 ml     Physical Exam: General:  Sitting on side of bed HEENT: normal Neck: supple. JVP 6-7 Carotids 2+ bilat; no bruits. No lymphadenopathy or thryomegaly appreciated. Cor: PMI  Laterally displaced. Regular rate & rhythm. No rubs, gallops or murmurs. Lungs: clear  Abdomen: soft, nontender, nondistended. No hepatosplenomegaly. No bruits or masses. Good bowel sounds. Extremities: no cyanosis, clubbing, rash,  R and LLE ted hose. LLE trace-1+ edema Neuro: alert & orientedx3, cranial nerves  grossly intact. moves all 4 extremities w/o difficulty. Affect pleasant  Telemetry: SR 70-80s  Labs: Basic Metabolic Panel:  Recent Labs Lab 08/27/14 0253 08/28/14 0304 08/29/14 0400 08/30/14 0416 08/31/14 0427 09/01/14 0427  NA 134* 137 140 140 142 140  K 3.9 4.0 4.2 3.7 3.6 4.1  CL 98 101 103 101 98 104  CO2 27 30 28  33* 33* 26  GLUCOSE 177* 153* 101* 143* 101* 92  BUN 30* 24* 22 22 17 16   CREATININE 1.20 0.99 0.94 1.12 1.15 1.09  CALCIUM 8.2* 8.2* 8.7 8.3* 8.0* 8.3*  MG 2.0  --   --   --   --   --     Liver Function Tests:  Recent Labs Lab 08/27/14 0253  AST 25  ALT 30  ALKPHOS 217*  BILITOT 0.9  PROT 6.1  ALBUMIN 2.6*   No results for input(s): LIPASE, AMYLASE in the last 168 hours. No results for input(s): AMMONIA in the last 168 hours.  CBC:  Recent Labs Lab 08/27/14 0025 08/27/14 0253 08/28/14 0304 08/29/14 0400 09/01/14 0427  WBC 22.4* 22.1* 18.3* 17.8* 16.0*  HGB 10.5* 10.2* 10.3* 10.6* 11.4*  HCT 33.0* 32.0* 32.7* 32.9* 36.2*  MCV 92.2 92.5 91.6 91.4 92.8  PLT 430* 416* 400 400 505*    Cardiac Enzymes:  Recent Labs Lab 08/27/14 0025  TROPONINI 0.12*    BNP: BNP (last 3 results)  Recent Labs  07/20/14 1053 08/27/14 0253  BNP 773.6* 1971.6*    ProBNP (last 3 results) No results for input(s): PROBNP in the last 8760 hours.    Other results:    Imaging: Dg Chest 2 View  08/31/2014   CLINICAL DATA:  Pleural effusion  EXAM: CHEST  2 VIEW  COMPARISON:  08/29/2014  FINDINGS: Cardiac shadow remains enlarged. The left lung remains clear. Persistent right basilar infiltrate with associated small effusion is noted. The effusion appears improved when compared with the prior exam although this is likely positional in nature.  IMPRESSION: Right basilar infiltrate with associated effusion.   Electronically Signed   By: Inez Catalina M.D.   On: 08/31/2014 07:39     Medications:     Scheduled Medications: . carvedilol  3.125 mg Oral  BID WC  . clopidogrel  75 mg Oral Daily  . digoxin  0.125 mg Oral Daily  . losartan  12.5 mg Oral Daily  . pantoprazole  40 mg Oral Daily  . rosuvastatin  10 mg Oral q1800  . sodium chloride  3 mL Intravenous Q12H  . spironolactone  12.5 mg Oral Daily  . Warfarin - Pharmacist Dosing Inpatient   Does not apply q1800    Infusions:    PRN Medications: sodium chloride, acetaminophen, guaiFENesin-codeine, ipratropium-albuterol, loperamide, ondansetron (ZOFRAN) IV, sodium chloride, traZODone, zolpidem   Assessment:   1. Acute on chronic respiratory failure - multifactorial 2. Acute bilateral PE with significant clot burden with RV strain on echo    --s/p IVC filter 2/26 3. Chronic systolic HF. EF 20% 4. Acute R DVT 5. CAD with anterior STEMI s/p PCI LAD with DES 06/12/14    -c/p shock and VT requiring IABP and DC-CV 6. Ankylosing spondylitis 7. Hypotension 8. Hemoptysis 9. DNR/DNI  Plan/Discussion:    S/P IVC 08/27/14 for bilateral PE. Off heparin. Pharmacy dosing coumadin. INR 2.3 Off ASA. Continue Plavix/warfarin.  Off indomethacin with anticoagulation.   Volume status stable. Continue current dose of dig, carvedilol, losartan, and spiro. BP a little soft. Renal function stable.    Will need cardiac rehab at [facility] due to recent MI.  Has made it clear he wants to be DNR/DNI should anything else happen.  Anticipate d/c tomorrow.  Length of Stay: 6   CLEGG,AMY NP-C  09/01/2014, 8:39 AM  Advanced Heart Failure Team Pager [phone] (M-F; 7a - 4p)  Please contact [facility] for night-coverage after hours (4p -7a ) and weekends on [email or web address]  Patient seen with NP, agree with the above note.  Patient is stable today. He walked in the halls yesterday, dyspneic by the time he reaches the end of the hall.  He continues to have loose stools, C diff negative.  Using Imodium.  He had provoked PE, will need 6 months warfarin + Plavix then stop warfarin and resume ASA 81 + Plavix.   Mild hemoptysis likely from PE/component of pulmonary infarction.  Would watch today, plan home tomorrow.  Will need to start cardiac rehab.  No med changes.   Loralie Champagne 09/01/2014 10:09 AM

## 2014-09-01 NOTE — Progress Notes (Signed)
ANTICOAGULATION CONSULT NOTE - Follow Up Consult  Pharmacy Consult:  Warfarin Indication:  Bilateral PE + RLE DVT  No Active Allergies  Patient Measurements: Height: 5\' 8"  (172.7 cm) Weight: 196 lb 4.8 oz (89.041 kg) IBW/kg (Calculated) : 68.4   Vital Signs: Temp: 97.5 F (36.4 C) (03/02 0415) Temp Source: Oral (03/02 0415) BP: 110/60 mmHg (03/02 0516) Pulse Rate: 79 (03/02 0415)  Labs:  Recent Labs  08/30/14 0042 08/30/14 0416 08/30/14 0654 08/30/14 1735 08/31/14 0427 09/01/14 0427  HGB  --   --   --   --   --  11.4*  HCT  --   --   --   --   --  36.2*  PLT  --   --   --   --   --  505*  LABPROT  --  23.8*  --   --  24.8* 25.5*  INR  --  2.11*  --   --  2.22* 2.30*  HEPARINUNFRC 0.70  --  0.72* >2.20*  --   --   CREATININE  --  1.12  --   --  1.15 1.09    Estimated Creatinine Clearance: 72.2 mL/min (by C-G formula based on Cr of 1.09).  Assessment: 89 YOM to continue on Coumadin for new bilateral PE and RLE DVT >> heparin D/C yesterday now that INR therapeutic. Noted documentation of low volume hemoptysis, now resolved.  CBC stable.  No other reports of bleeding at this time.  INR remains therapeutic today.  Goal of Therapy:  INR 2-3 Monitor platelets by anticoagulation protocol: Yes   Plan:  - Coumadin 4 mg PO x 1 tonight - Daily INR/CBC - Monitor for s/sx of bleeding  Drucie Opitz, PharmD Clinical Pharmacy Resident Pager: [phone] 09/01/2014 8:50 AM

## 2014-09-01 NOTE — Progress Notes (Signed)
Medicare Important Message given? YES (If response is "NO", the following Medicare IM given date fields will be blank) Date Medicare IM given:09/01/2014 Medicare IM given by: Kameron Glazebrook 

## 2014-09-01 NOTE — Progress Notes (Addendum)
1150-1215 Cardiac Rehab Pt has walked twice today and is doing fair. He states that he does not feel as well as yesterday, but that he has continued to do his walking. Discussed Outpt CRP in [place], will send referral. Reviewed CHF education with pt and wife. They voice understanding. I encouraged him to get back with his walking at discharge and to follow the guidelines that we gave his previously. They are doing daily weights, following a low sodium diet and I encouraged them to restrict his fluid to 2 liters per day. Pt is open and receptive to the information provided. He is walking independently, cardiac rehab goals met will sign off. Hughes, Lisa G, RN 09/01/2014 12:21 PM   

## 2014-09-02 ENCOUNTER — Telehealth: Payer: Self-pay | Admitting: *Deleted

## 2014-09-02 ENCOUNTER — Encounter (HOSPITAL_COMMUNITY): Payer: Self-pay | Admitting: Physician Assistant

## 2014-09-02 ENCOUNTER — Encounter (HOSPITAL_COMMUNITY): Payer: Medicare Other

## 2014-09-02 LAB — PROTIME-INR
INR: 2.48 — ABNORMAL HIGH (ref 0.00–1.49)
Prothrombin Time: 27 seconds — ABNORMAL HIGH (ref 11.6–15.2)

## 2014-09-02 MED ORDER — WARFARIN SODIUM 4 MG PO TABS: 4.0000 mg | ORAL_TABLET | Freq: Every day | ORAL | Status: DC

## 2014-09-02 MED ORDER — POTASSIUM CHLORIDE ER 20 MEQ PO TBCR: EXTENDED_RELEASE_TABLET | ORAL | Status: DC

## 2014-09-02 MED ORDER — WARFARIN SODIUM 4 MG PO TABS
4.0000 mg | ORAL_TABLET | Freq: Once | ORAL | Status: DC
  Filled 2014-09-02: qty 1

## 2014-09-02 MED ORDER — FUROSEMIDE 40 MG PO TABS: ORAL_TABLET | ORAL | Status: DC

## 2014-09-02 NOTE — Progress Notes (Signed)
Pt denies using BIPAP at home and does not want to try it tonight. No respiratory distress noted.

## 2014-09-02 NOTE — Telephone Encounter (Signed)
-----   Message from Juanito Doom, MD sent at 08/30/2014 12:50 PM EST ----- A,  He is still hospitalized, please ensure hospital f/u with me in 6-8 weeks for PE  Thanks B

## 2014-09-02 NOTE — Discharge Summary (Signed)
Discharge Summary   Patient ID: Scott [name] MRN: [id number], DOB/AGE: Feb 10, 1946 69 y.o. Admit date: 08/26/2014 D/C date:     09/02/2014  Primary Care Provider: No primary care provider on file. Primary Cardiologist: Bensimhon (CHF)  Primary Discharge Diagnoses:  1. Acute on chronic respiratory failure - multifactorial 2. Acute bilateral PE with significant clot burden with RV strain on echo  --s/p retrievable IVC filter 08/27/14 with plans to remove ?3-6 months 3. Acute R DVT 4. Acute on chronic systolic CHF/Ischemic cardiology EF 20% 5. CAD - elevated troponin at outside hospital in setting of PEs - history of stenting of LCx in 2013, anterior STEMI s/p PCI LAD with DES 06/12/14, c/b shock and VT requiring IABP and DCCV, NSTEMI 06/26/2014 treated medically 6. Ankylosing spondylitis 7. Hypotension 8. Hemoptysis possibly due to pulmonary infarct 9. Small right pleural effusion 10. Leukocytosis 11. LIMITED CODE - CPR/shock OK (requests one shock only), DNI, continuing with LifeVest  Secondary Discharge Diagnoses:  1. DM2 2. Carotid artery disease s/p stenting 3. Hypertension 4. Hyperlipidemia 5. H/o VT as above in [facility] Course:  Mr. Pattison is a 69 year old male with complex PMH of CAD (stenting of LCx 2013, recent history of STEMI 06/12/2014 s/p PCI to LAD complicated by post-cath shock requiring IABP then developed VT s/p DCCV/EF 20%, NSTEMI 06/26/2014 treated medically), DM2, acute respiratory failure, carotid stenting, and ankylosing spondylitis who presented to [facility] 08/22/2013 with 5 day history of worsening SOB, weight gain, and was found to have bilateral PE.  To briefly recap his history, he was admitted 06/12/2014 with anterior STEMI and taken to the cath lab emergently and underwent PCI to LAD. LVEDP was very high and BP was low so IABP placed.Post-cath he developed VT and respiratory failure requiring emergent DCCV and short term intubation. He was  placed on amiodarone drip and after he was adequately loaded he wastransitioned to po. EF 20%. CHF meds added. He was discharged with LifeVest. On 12/26 he was admitted to [facility] for NSTEMI with peak troponin of 2.50. Sounds like NSTEMI was managed medically per admit notes. He was not seen by his primary cardiology group [place]. He was seen by [facility] and HF meds further downtitrated due to soft BPs. Corlanor started BID secondary to elevated HR.  He called the answering service 08/22/14 complaining of increased SOB. Was having issues with ankylosing spondylitis pain and not supposed take indocin due to heart issues per rheum though cardio wanted him to take prn. He took extra sulfalzine as directed by his rheumatologist which made him sick. He has been increasingly SOB and having weight gain, orthopnea and PND. He was advised to come to [facility]. Also noted some hemoptysis. Was found to have bilateral PE. He was started on heparin/Warfarin bridge. Aspirin stopped due to risk of bleeding with triple therapy, but Plavix continued given recent PCI 06/2014. Troponin 0.21-->0.19. WBC 20.1-->17.6. Echo showed EF <20%, severely decreased global systolic LV function, dilated CM, moderate MR/TR, reported RV strain Blood pressures remained soft in the 90s/70s. On the afternoon of 2/23 BP was in the 70s/50s requiring 250 cc NS bolus and suspension of medication except for digoxin. Several days later he seemed mildly fluid overloaded and was treated with Lasix. Family requested transfer to [facility] to where his primary cardiologist was ([place]). LE dopplers from OSH: Right leg DVT. Pulmonary also followed along due to worsening hypoxia requiring NRB. They felt this was primarily due to PE, though possible component  of atelectasis from splinting and possibly a pleural effusion on the right. Limited chest US showed small right pleural effusion. Given RV strain, large PE burden and residual RLE DVT, Dr. Vaughan Browner recommended IVC  filter at least temporarily. This was placed by IR 08/27/14. Transient hemoptysis felt possibly secondary to pulmonary infarct. HF meds were continued as tolerated, occasionally receiving low dose IV Lasix. Pulmonology felt this was acute provoked PE and needs 3-6 months of warfarin, and that IVC filter needs to be removed within 3-6 months. Dr. Aundra Dubin recommended that he will need 6 months warfarin + Plavix then stop warfarin and resume ASA 81 + Plavix. Had some diarrhea but C diff negative. Has leukocytosis but this has been improving since admission, may need to be rechecked as outpatient.  Today he is feeling better and volume status is stable. He is off indomethacin with anticoagulation. Dr. Haroldine Laws recommends to continue current dose of dig, carvedilol, losartan, and spiro, resume home corlanor, and add Lasix 40mg  on Monday/Friday with 21meq KCl, hold if weight 196 or less. BP a little soft. Dr. Haroldine Laws has seen and examined the patient today and feels he is stable for discharge. By d/c he is no longer requiring supplemental O2.  Per notes the patient previously made it clear that he wanted to be DNR/DNI should anything else happen, pressors ok. At time of discharge, I was asked by the patient and his family to comment on whether or not he should continue LifeVest. (If a patient is DNR then a LifeVest contradicts this.) In talking further, the patient told me that he did not want to be intubated, but does want attempts at resuscitation including CPR and defibrillation. He said he wanted "one shock and that's it." I told him it can difficult to assure a certain number of shocks in the event of an acute emergency as the person running the code may not necessarily have access to the chart to determine how many shocks a patient has requested  (if outside the [facility] system) - but I have updated his Code status in EPIC to reflect his wishes so that the information is up to date in our system. He does make it  clear he does not want any "heroic measures." Per our discussion for now he elects a limited code status allowing CPR/shock except wants to be DNI. His wife is well aware of his wishes as well and knows to convey these clearly to the team in the event of an emergency. He will continue wearing the LifeVest and I asked him to discuss further plans with Dr. Haroldine Laws at his f/u.  Dr. Anastasia Pall note from 08/30/14 says he has made arrangements to followup with him. Their office does not have an appointments listed for the patient. Dr. Haroldine Laws does not feel they necessarily need to see him, and recommends close f/u in the CHF clinic which is scheduled for 09/10/14.  Regarding followup: - sent message to [facility] APP to help refer to cardiac rehab at [facility] - has INR check scheduled, pharmacy recommending Coumadin 4mg  daily - has f/u with HF Clinic 09/10/14    Discharge Vitals: Blood pressure 97/65, pulse 86, temperature 97.9 F (36.6 C), temperature source Oral, resp. rate 17, height 5\' 8"  (1.727 m), weight 195 lb 5.2 oz (88.6 kg), SpO2 96 %.  Labs: Lab Results  Component Value Date   WBC 16.0* 09/01/2014   HGB 11.4* 09/01/2014   HCT 36.2* 09/01/2014   MCV 92.8 09/01/2014   PLT 505*  09/01/2014    Recent Labs Lab 08/27/14 0253  09/01/14 0427  NA 134*  < > 140  K 3.9  < > 4.1  CL 98  < > 104  CO2 27  < > 26  BUN 30*  < > 16  CREATININE 1.20  < > 1.09  CALCIUM 8.2*  < > 8.3*  PROT 6.1  --   --   BILITOT 0.9  --   --   ALKPHOS 217*  --   --   ALT 30  --   --   AST 25  --   --   GLUCOSE 177*  < > 92  < > = values in this interval not displayed.   Diagnostic Studies/Procedures   LE dopplers from OSH: Right leg DVT  CT Chest from OSH: bilateral diffuse PEs w/o overt signs of right heart strain  Dg Chest 2 View  08/31/2014   CLINICAL DATA:  Pleural effusion  EXAM: CHEST  2 VIEW  COMPARISON:  08/29/2014  FINDINGS: Cardiac shadow remains enlarged. The left lung remains clear. Persistent  right basilar infiltrate with associated small effusion is noted. The effusion appears improved when compared with the prior exam although this is likely positional in nature.  IMPRESSION: Right basilar infiltrate with associated effusion.   Electronically Signed   By: Inez Catalina M.D.   On: 08/31/2014 07:39   [place] Chest  08/27/2014   CLINICAL DATA:  Acute respiratory failure with hypoxia. Pleural effusion.  EXAM: CHEST ULTRASOUND  COMPARISON:  None.  FINDINGS: Ultrasound imaging of the right hemithorax shows the presence of a small right pleural effusion. No plural effusion is seen in the left lower hemithorax.  IMPRESSION: Small right-sided pleural effusion.   Electronically Signed   By: Earle Gell M.D.   On: 08/27/2014 14:27   Ir Ivc Filter Plmt / S&i /img Guid/mod Sed  08/27/2014   INDICATION: History of pulmonary embolism and right lower extremity DVT. Patient with history of ischemic cardiomyopathy. Request made for placement of an IVC filter to help to prevent additional lower extremity clot from migrating to the lungs and further worsening patient's right-sided heart failure.  EXAM: ULTRASOUND GUIDANCE FOR VASCULAR ACCESS  IVC CATHETERIZATION AND VENOGRAM  IVC FILTER INSERTION  MEDICATIONS: Fentanyl 25 mcg IV; Versed 0.5 mg IV  ANESTHESIA/SEDATION: Sedation Time  7 minutes  CONTRAST:  92mL OMNIPAQUE IOHEXOL 300 MG/ML  SOLN  FLUOROSCOPY TIME:  1 minutes (41.9 mGy)  COMPLICATIONS: None immediate  PROCEDURE: Informed written consent was obtained from the patient following explanation of the procedure, risks, benefits and alternatives. A time out was performed prior to the initiation of the procedure. Maximal barrier sterile technique utilized including caps, mask, sterile gowns, sterile gloves, large sterile drape, hand hygiene, and Betadine prep.  Under sterile condition and local anesthesia, right common femoral venous access was performed with ultrasound. An ultrasound image was saved and sent to PACS.  Over a guidewire, the IVC filter delivery sheath and inner dilator were advanced into the IVC just above the IVC bifurcation. Contrast injection was performed for an IVC venogram.  Through the delivery sheath, a retrievable [place] IVC filter was deployed below the level of the renal veins and above the IVC bifurcation. Limited post deployment venacavagram was performed.  The delivery sheath was removed and hemostasis was obtained with manual compression. A dressing was placed. The patient tolerated the procedure well without immediate post procedural complication.  FINDINGS: The IVC is patent. No evidence of  thrombus, stenosis, or occlusion. No variant venous anatomy. Successful placement of the IVC filter below the level of the renal veins.  IMPRESSION: Successful ultrasound and fluoroscopically guided placement of an infrarenal retrievable IVC filter.  PLAN: This IVC filter is potentially retrievable. The patient will be assessed for filter retrieval by Interventional Radiology in approximately 8-12 weeks. Further recommendations regarding filter retrieval, continued surveillance or declaration of device permanence, will be made at that time.   Electronically Signed   By: Sandi Mariscal M.D.   On: 08/27/2014 19:03   Dg Chest Port 1 View  08/29/2014   CLINICAL DATA:  Hemoptysis.  EXAM: PORTABLE CHEST - 1 VIEW  COMPARISON:  08/28/2014  FINDINGS: The heart is enlarged. There is increased opacity in the right lung, consistent with a fusion and significant basilar opacity, either atelectasis or infiltrate or infarct. Suspect mild interstitial edema. Left lung is essentially clear.  IMPRESSION: 1. Cardiomegaly and mild interstitial edema. 2. Increased opacity in the right upper lobe and increased pleural effusion.   Electronically Signed   By: Nolon Nations M.D.   On: 08/29/2014 09:04   Dg Chest Port 1 View  08/28/2014   CLINICAL DATA:  Pleural effusion  EXAM: PORTABLE CHEST - 1 VIEW  COMPARISON:  08/26/2014   FINDINGS: Elevation of the right hemidiaphragm with small right-sided pleural effusion is again noted. The cardiac shadow remains enlarged. The lungs are otherwise clear.  IMPRESSION: Stable changes on the right.  No acute abnormality is noted.   Electronically Signed   By: Inez Catalina M.D.   On: 08/28/2014 08:04   Dg Chest Port 1 View  08/27/2014   CLINICAL DATA:  Hypoxia. Respiratory distress. Shortness of breath.  EXAM: PORTABLE CHEST - 1 VIEW  COMPARISON:  Chest 08/22/2014.  CT chest 08/22/2014.  FINDINGS: Shallow inspiration with elevation of right hemidiaphragm. Focal infiltration in the right lung base medially corresponding to consolidation seen on previous CT. This may represent focal pneumonia. Cardiac enlargement. No pulmonary vascular congestion. No pneumothorax. Left lung is grossly clear.  IMPRESSION: Cardiac enlargement. Persistent airspace infiltration in the right cardiophrenic angle. Elevation of right hemidiaphragm.   Electronically Signed   By: Lucienne Capers M.D.   On: 08/27/2014 02:15    Discharge Medications   Current Discharge Medication List    START taking these medications   Details  furosemide (LASIX) 40 MG tablet Take 1 tablet (40mg ) by mouth on Mondays and Fridays. Hold if weight is 196 lbs or less. Qty: 30 tablet, Refills: 2    potassium chloride 20 MEQ TBCR Take 1 tablet (70meq) by mouth on Mondays and Fridays (with Lasix). Hold if weight is 196 lbs or less. Qty: 30 tablet, Refills: 2    warfarin (COUMADIN) 4 MG tablet Take 1 tablet (4 mg total) by mouth daily. Qty: 30 tablet, Refills: 2      CONTINUE these medications which have NOT CHANGED   Details  acetaminophen (TYLENOL) 500 MG tablet Take 1,000 mg by mouth every 6 (six) hours as needed.    metaxalone (SKELAXIN) 800 MG tablet Take 800 mg by mouth daily as needed. As needed    nitroGLYCERIN (NITROSTAT) 0.3 MG SL tablet Place 0.3 mg under the tongue every 5 (five) minutes as needed for chest pain.      SIMETHICONE PO Take 1 tablet by mouth daily as needed (bloating). Take 1 chewable tablet of [facility] [employer] as needed for bloating.    spironolactone (ALDACTONE) 25 MG tablet Take 12.5 mg  by mouth daily.    carvedilol (COREG) 6.25 MG tablet Take 0.5 tablets (3.125 mg total) by mouth 2 (two) times daily.     clopidogrel (PLAVIX) 75 MG tablet Take 1 tablet (75 mg total) by mouth daily with breakfast.     digoxin (LANOXIN) 0.125 MG tablet Take 1 tablet (0.125 mg total) by mouth daily.     ivabradine (CORLANOR) 5 MG TABS tablet Take 1 tablet (5 mg total) by mouth 2 (two) times daily with a meal.     losartan (COZAAR) 25 MG tablet Take 0.5 tablets (12.5 mg total) by mouth daily. Only takes when BP is higher then 90 SBP     Multiple Vitamins-Minerals (MULTIVITAMIN WITH MINERALS) tablet Take 1 tablet by mouth daily.    pantoprazole (PROTONIX) 40 MG tablet Take 1 tablet (40 mg total) by mouth daily.     rosuvastatin (CRESTOR) 10 MG tablet Take 10 mg by mouth daily.    traZODone (DESYREL) 100 MG tablet Take 50 mg by mouth at bedtime.    zolpidem (AMBIEN) 10 MG tablet Take 1 tablet (10 mg total) by mouth at bedtime as needed for sleep.       STOP taking these medications     indomethacin (INDOCIN SR) 75 MG CR capsule      aspirin EC 81 MG tablet         Disposition   The patient will be discharged in stable condition to home. Discharge Instructions    Amb Referral to Cardiac Rehabilitation    Complete by:  As directed   Pt agrees to Outpt. CRP in [place], will send referral.     Diet - low sodium heart healthy    Complete by:  As directed      Increase activity slowly    Complete by:  As directed   Do not participate in any strenuous activity until cleared by your cardiologist. Keep procedure site clean & dry. If you notice increased pain, swelling, bleeding or pus, call/return!  You may shower, but no soaking baths/hot tubs/pools for 1 week.  Aspirin and indomethacin  have been stopped because of the addition of your blood thinner Coumadin.          Follow-up Information    Follow up with [name], MD On 09/10/2014.   Specialty:  Cardiology   Why:  at 0840 am in the Advanced Heart Failure Clinic--gate code 7000--bring all medications   Contact information:   [place] [address] [phone]       Follow up with [facility].   Specialty:  Cardiology   Why:  Coumadin Clinic Check - 09/08/14 at 11:30am   Contact information:   [address] [place] [phone]        Duration of Discharge Encounter: Greater than 30 minutes including physician and PA time.  Signed, Melina Copa PA-C 09/02/2014, 12:10 PM  Patient seen and examined with Melina Copa, PA-C. We discussed all aspects of the encounter. I agree with the assessment and plan as stated above.   He is ready for d/c today will need close f/u in HF clinic.  Benay Spice 3:35 PM

## 2014-09-02 NOTE — Progress Notes (Addendum)
Advanced Heart Failure Rounding Note   Subjective:    69 year old male with history of CAD with stenting of LCx 2013 with recent history of STEMI 06/12/2014 s/p PCI to LAD complicated by post-cath shock required IABP then developed VT s/p DCCV, EF 20%, NSTEMI 06/26/2014 treated medically, DM2, acute respiratory failure, carotid stenting, and ankylosing spondylitis who presented to [facility] 08/22/2013 with 5 day history of worsening SOB, weight gain, and was found to have bilateral PE and RLE DVT.  Echo EF 20% with RV strain. Transfer to [facility] 2/25. CCM called 2/25 for worsening hypoxia. On heparin and coumadin.   He is s/p IVC filter 2/26.  Feeling better. Weak denies dyspnea.   Objective:   Weight Range:  Vital Signs:   Temp:  [97.8 F (36.6 C)-97.9 F (36.6 C)] 97.9 F (36.6 C) (03/03 0500) Pulse Rate:  [90-91] 91 (03/03 0500) Resp:  [17-18] 17 (03/03 0500) BP: (89-99)/(60-67) 97/65 mmHg (03/03 0500) SpO2:  [92 %-96 %] 96 % (03/03 0500) Weight:  [88.6 kg (195 lb 5.2 oz)] 88.6 kg (195 lb 5.2 oz) (03/03 0700) Last BM Date: 09/01/14  Weight change: Filed Weights   08/31/14 0446 09/01/14 0415 09/02/14 0700  Weight: 89.2 kg (196 lb 10.4 oz) 89.041 kg (196 lb 4.8 oz) 88.6 kg (195 lb 5.2 oz)    Intake/Output:   Intake/Output Summary (Last 24 hours) at 09/02/14 1018 Last data filed at 09/02/14 0741  Gross per 24 hour  Intake    840 ml  Output    100 ml  Net    740 ml     Physical Exam: General:  Sitting on side of bed HEENT: normal Neck: supple. JVP 6-7 Carotids 2+ bilat; no bruits. No lymphadenopathy or thryomegaly appreciated. Cor: PMI  Laterally displaced. Regular rate & rhythm. No rubs, gallops or murmurs. Lungs: clear  Abdomen: soft, nontender, nondistended. No hepatosplenomegaly. No bruits or masses. Good bowel sounds. Extremities: no cyanosis, clubbing, rash,  R and LLE ted hose. LLE trace-1+ edema Neuro: alert & orientedx3, cranial nerves grossly intact. moves all 4  extremities w/o difficulty. Affect pleasant  Telemetry: SR 70-80s  Labs: Basic Metabolic Panel:  Recent Labs Lab 08/27/14 0253 08/28/14 0304 08/29/14 0400 08/30/14 0416 08/31/14 0427 09/01/14 0427  NA 134* 137 140 140 142 140  K 3.9 4.0 4.2 3.7 3.6 4.1  CL 98 101 103 101 98 104  CO2 27 30 28  33* 33* 26  GLUCOSE 177* 153* 101* 143* 101* 92  BUN 30* 24* 22 22 17 16   CREATININE 1.20 0.99 0.94 1.12 1.15 1.09  CALCIUM 8.2* 8.2* 8.7 8.3* 8.0* 8.3*  MG 2.0  --   --   --   --   --     Liver Function Tests:  Recent Labs Lab 08/27/14 0253  AST 25  ALT 30  ALKPHOS 217*  BILITOT 0.9  PROT 6.1  ALBUMIN 2.6*   No results for input(s): LIPASE, AMYLASE in the last 168 hours. No results for input(s): AMMONIA in the last 168 hours.  CBC:  Recent Labs Lab 08/27/14 0025 08/27/14 0253 08/28/14 0304 08/29/14 0400 09/01/14 0427  WBC 22.4* 22.1* 18.3* 17.8* 16.0*  HGB 10.5* 10.2* 10.3* 10.6* 11.4*  HCT 33.0* 32.0* 32.7* 32.9* 36.2*  MCV 92.2 92.5 91.6 91.4 92.8  PLT 430* 416* 400 400 505*    Cardiac Enzymes:  Recent Labs Lab 08/27/14 0025  TROPONINI 0.12*    BNP: BNP (last 3 results)  Recent Labs  07/20/14 1053 08/27/14 0253  BNP 773.6* 1971.6*    ProBNP (last 3 results) No results for input(s): PROBNP in the last 8760 hours.    Other results:    Imaging: No results found.   Medications:     Scheduled Medications: . carvedilol  3.125 mg Oral BID WC  . clopidogrel  75 mg Oral Daily  . digoxin  0.125 mg Oral Daily  . losartan  12.5 mg Oral Daily  . pantoprazole  40 mg Oral Daily  . rosuvastatin  10 mg Oral q1800  . sodium chloride  3 mL Intravenous Q12H  . spironolactone  12.5 mg Oral Daily  . Warfarin - Pharmacist Dosing Inpatient   Does not apply q1800    Infusions:    PRN Medications: sodium chloride, acetaminophen, guaiFENesin-codeine, ipratropium-albuterol, loperamide, ondansetron (ZOFRAN) IV, sodium chloride, traZODone,  zolpidem   Assessment:   1. Acute on chronic respiratory failure - multifactorial 2. Acute bilateral PE with significant clot burden with RV strain on echo    --s/p IVC filter 2/26 3. Chronic systolic HF. EF 20% 4. Acute R DVT 5. CAD with anterior STEMI s/p PCI LAD with DES 06/12/14    -c/p shock and VT requiring IABP and DC-CV 6. Ankylosing spondylitis 7. Hypotension 8. Hemoptysis 9. DNR/DNI  Plan/Discussion:    S/P IVC 08/27/14 for bilateral PE. Off heparin. Pharmacy dosing coumadin. INR 2.5 Off ASA. Continue Plavix/warfarin.  Off indomethacin with anticoagulation. Consider removing IVC filter in 3 months.   Volume status stable. Continue current dose of dig, carvedilol, losartan, and spiro. BP a little soft. Renal function stable. Would add lasix 40 mg on Monday and Friday with kcl 20. Hold if weight 196 or less. Resume corlanor if he has it at home. If not, will get it for him at next office visit.    Will need cardiac rehab at [facility] due to recent MI. F/u next week in HF clinic.   Has made it clear he wants to be DNR/DNI should anything else happen.  D/c today.  Length of Stay: 7   [name] MD  09/02/2014, 10:18 AM  Advanced Heart Failure Team Pager [phone] (M-F; [place])  Please contact [facility] for night-coverage after hours (4p -7a ) and weekends on [email or web address]

## 2014-09-02 NOTE — Progress Notes (Signed)
MD returned phone call.  No new orders received.  Patient still resting in recliner.  RN will continue to monitor patient .

## 2014-09-02 NOTE — Discharge Instructions (Signed)

## 2014-09-02 NOTE — Telephone Encounter (Signed)
[id number] x1 

## 2014-09-02 NOTE — Telephone Encounter (Signed)
Pt called back and Claiborne Billings made appt

## 2014-09-02 NOTE — Progress Notes (Signed)
RN attempt to call Cardiology fellow X2 for patient widened QRS throughout the night.  No return phone call.  Patient resting comfortably in recliner.  RN will continue to monitor patient .

## 2014-09-02 NOTE — Progress Notes (Signed)
ANTICOAGULATION CONSULT NOTE - Follow Up Consult  Pharmacy Consult:  Warfarin Indication:  Bilateral PE + RLE DVT  No Active Allergies  Patient Measurements: Height: 5\' 8"  (172.7 cm) Weight: 195 lb 5.2 oz (88.6 kg) IBW/kg (Calculated) : 68.4   Vital Signs: Temp: 97.9 F (36.6 C) (03/03 0500) Temp Source: Oral (03/03 0500) BP: 97/65 mmHg (03/03 0500) Pulse Rate: 86 (03/03 1040)  Labs:  Recent Labs  08/30/14 1735 08/31/14 0427 09/01/14 0427 09/02/14 0327  HGB  --   --  11.4*  --   HCT  --   --  36.2*  --   PLT  --   --  505*  --   LABPROT  --  24.8* 25.5* 27.0*  INR  --  2.22* 2.30* 2.48*  HEPARINUNFRC >2.20*  --   --   --   CREATININE  --  1.15 1.09  --     Estimated Creatinine Clearance: 72.1 mL/min (by C-G formula based on Cr of 1.09).  Assessment: 69yo male to continue on Coumadin for new bilateral PE and RLE DVT, no longer on heparin w/ therapeutic INR. Noted previous documentation of low volume hemoptysis, now resolved.  CBC stable.  No other reports of bleeding at this time.  INR remains therapeutic today.  Pt expected to be discharged today.  Pt to complete 62mo of warfarin + plavix then >> ASA + plavix  Discharge recommendations: Warfarin 4mg  daily Follow-up in Coumadin Clinic on Monday, 09/06/14  Goal of Therapy:  INR 2-3 Monitor platelets by anticoagulation protocol: Yes   Plan:  - Coumadin 4mg  PO x 1 tonight if remaining inpatient - Daily INR, q72h CBC - Monitor for s/sx of bleeding  Drucie Opitz, PharmD Clinical Pharmacy Resident Pager: [phone] 09/02/2014 12:19 PM

## 2014-09-03 ENCOUNTER — Other Ambulatory Visit: Payer: Self-pay | Admitting: Physician Assistant

## 2014-09-03 ENCOUNTER — Telehealth (HOSPITAL_COMMUNITY): Payer: Self-pay | Admitting: Vascular Surgery

## 2014-09-03 DIAGNOSIS — I509 Heart failure, unspecified: Secondary | ICD-10-CM

## 2014-09-03 NOTE — Telephone Encounter (Signed)
Pt wife called pt bp is low 77/48 pulse 46.Marland Kitchen Please advise

## 2014-09-03 NOTE — Telephone Encounter (Signed)
Spoke with patient's wife c/o patient's BP of 77/48 and pulse 46.  Patient very weak, also having loose stools this past week.  Wife states patient was discharged on Spironolactone and thinks this may be part of problem.  Patient had an adverse reaction to this medication back in December with a low BP and HR and subsequently ended up being hospitalized for a few days to correct this and was taken off of Spironolactone at that time.  Advised to have patient stop this until seen in our clinic next week.  Patient has also not been able to take Corlanor because of low HR, so advised to hold this until we see him next week as well.  For today, instructed patient to also hold lasix, coreg, and losartan, and to drink a couple extra glasses of water and rest with feet elevated.  If vitals become worse or if patient has worsening symptoms advised to go to emergency room.  Will call us Monday with update.  Wife and patient aware and agreeable to plan.  Renee Pain

## 2014-09-08 ENCOUNTER — Ambulatory Visit (INDEPENDENT_AMBULATORY_CARE_PROVIDER_SITE_OTHER): Payer: Medicare Other

## 2014-09-08 DIAGNOSIS — I2699 Other pulmonary embolism without acute cor pulmonale: Secondary | ICD-10-CM

## 2014-09-08 DIAGNOSIS — Z5181 Encounter for therapeutic drug level monitoring: Secondary | ICD-10-CM

## 2014-09-08 LAB — POCT INR: INR: 3.5

## 2014-09-08 NOTE — Patient Instructions (Signed)

## 2014-09-10 ENCOUNTER — Ambulatory Visit (HOSPITAL_BASED_OUTPATIENT_CLINIC_OR_DEPARTMENT_OTHER)
Admit: 2014-09-10 | Discharge: 2014-09-10 | Disposition: A | Discharge status: Home / Self Care | Payer: Medicare Other | Source: Ambulatory Visit | Attending: Internal Medicine | Admitting: Internal Medicine

## 2014-09-10 ENCOUNTER — Inpatient Hospital Stay (HOSPITAL_COMMUNITY)
Admission: AD | Admit: 2014-09-10 | Discharge: 2014-11-02 | DRG: 001 | Disposition: A | Discharge status: Home Health | Payer: Medicare Other | Source: Ambulatory Visit | Attending: Cardiothoracic Surgery | Admitting: Cardiothoracic Surgery

## 2014-09-10 ENCOUNTER — Encounter (HOSPITAL_COMMUNITY)
Admission: AD | Disposition: A | Discharge status: Home Health | Payer: Self-pay | Source: Ambulatory Visit | Attending: Cardiothoracic Surgery

## 2014-09-10 ENCOUNTER — Other Ambulatory Visit (HOSPITAL_COMMUNITY): Payer: Self-pay | Admitting: Adult Health

## 2014-09-10 ENCOUNTER — Encounter (HOSPITAL_COMMUNITY): Payer: Self-pay | Admitting: Oncology

## 2014-09-10 VITALS — BP 92/66 | HR 76 | Wt 203.0 lb

## 2014-09-10 DIAGNOSIS — Y838 Other surgical procedures as the cause of abnormal reaction of the patient, or of later complication, without mention of misadventure at the time of the procedure: Secondary | ICD-10-CM | POA: Diagnosis not present

## 2014-09-10 DIAGNOSIS — I472 Ventricular tachycardia: Secondary | ICD-10-CM | POA: Diagnosis not present

## 2014-09-10 DIAGNOSIS — I2699 Other pulmonary embolism without acute cor pulmonale: Secondary | ICD-10-CM | POA: Insufficient documentation

## 2014-09-10 DIAGNOSIS — I469 Cardiac arrest, cause unspecified: Secondary | ICD-10-CM | POA: Diagnosis not present

## 2014-09-10 DIAGNOSIS — J918 Pleural effusion in other conditions classified elsewhere: Secondary | ICD-10-CM | POA: Diagnosis present

## 2014-09-10 DIAGNOSIS — I350 Nonrheumatic aortic (valve) stenosis: Secondary | ICD-10-CM | POA: Diagnosis not present

## 2014-09-10 DIAGNOSIS — I480 Paroxysmal atrial fibrillation: Secondary | ICD-10-CM | POA: Diagnosis not present

## 2014-09-10 DIAGNOSIS — E861 Hypovolemia: Secondary | ICD-10-CM | POA: Diagnosis not present

## 2014-09-10 DIAGNOSIS — I4891 Unspecified atrial fibrillation: Secondary | ICD-10-CM | POA: Diagnosis not present

## 2014-09-10 DIAGNOSIS — G4700 Insomnia, unspecified: Secondary | ICD-10-CM | POA: Diagnosis not present

## 2014-09-10 DIAGNOSIS — R0600 Dyspnea, unspecified: Secondary | ICD-10-CM | POA: Diagnosis not present

## 2014-09-10 DIAGNOSIS — I97618 Postprocedural hemorrhage and hematoma of a circulatory system organ or structure following other circulatory system procedure: Secondary | ICD-10-CM | POA: Diagnosis not present

## 2014-09-10 DIAGNOSIS — R7989 Other specified abnormal findings of blood chemistry: Secondary | ICD-10-CM

## 2014-09-10 DIAGNOSIS — R531 Weakness: Secondary | ICD-10-CM

## 2014-09-10 DIAGNOSIS — I272 Other secondary pulmonary hypertension: Secondary | ICD-10-CM | POA: Diagnosis present

## 2014-09-10 DIAGNOSIS — I5189 Other ill-defined heart diseases: Secondary | ICD-10-CM | POA: Diagnosis not present

## 2014-09-10 DIAGNOSIS — R040 Epistaxis: Secondary | ICD-10-CM | POA: Diagnosis not present

## 2014-09-10 DIAGNOSIS — Z515 Encounter for palliative care: Secondary | ICD-10-CM

## 2014-09-10 DIAGNOSIS — I252 Old myocardial infarction: Secondary | ICD-10-CM | POA: Diagnosis not present

## 2014-09-10 DIAGNOSIS — E1165 Type 2 diabetes mellitus with hyperglycemia: Secondary | ICD-10-CM | POA: Diagnosis present

## 2014-09-10 DIAGNOSIS — J9811 Atelectasis: Secondary | ICD-10-CM | POA: Diagnosis not present

## 2014-09-10 DIAGNOSIS — J9600 Acute respiratory failure, unspecified whether with hypoxia or hypercapnia: Secondary | ICD-10-CM | POA: Diagnosis not present

## 2014-09-10 DIAGNOSIS — R000 Tachycardia, unspecified: Secondary | ICD-10-CM | POA: Diagnosis not present

## 2014-09-10 DIAGNOSIS — Z01818 Encounter for other preprocedural examination: Secondary | ICD-10-CM | POA: Diagnosis not present

## 2014-09-10 DIAGNOSIS — G4733 Obstructive sleep apnea (adult) (pediatric): Secondary | ICD-10-CM | POA: Diagnosis present

## 2014-09-10 DIAGNOSIS — R918 Other nonspecific abnormal finding of lung field: Secondary | ICD-10-CM | POA: Diagnosis not present

## 2014-09-10 DIAGNOSIS — I82511 Chronic embolism and thrombosis of right femoral vein: Secondary | ICD-10-CM | POA: Diagnosis present

## 2014-09-10 DIAGNOSIS — Z955 Presence of coronary angioplasty implant and graft: Secondary | ICD-10-CM

## 2014-09-10 DIAGNOSIS — Z7902 Long term (current) use of antithrombotics/antiplatelets: Secondary | ICD-10-CM

## 2014-09-10 DIAGNOSIS — G479 Sleep disorder, unspecified: Secondary | ICD-10-CM | POA: Diagnosis not present

## 2014-09-10 DIAGNOSIS — Z86711 Personal history of pulmonary embolism: Secondary | ICD-10-CM | POA: Diagnosis not present

## 2014-09-10 DIAGNOSIS — I2510 Atherosclerotic heart disease of native coronary artery without angina pectoris: Secondary | ICD-10-CM | POA: Diagnosis not present

## 2014-09-10 DIAGNOSIS — N200 Calculus of kidney: Secondary | ICD-10-CM | POA: Diagnosis not present

## 2014-09-10 DIAGNOSIS — R05 Cough: Secondary | ICD-10-CM

## 2014-09-10 DIAGNOSIS — Z95828 Presence of other vascular implants and grafts: Secondary | ICD-10-CM | POA: Diagnosis not present

## 2014-09-10 DIAGNOSIS — J9690 Respiratory failure, unspecified, unspecified whether with hypoxia or hypercapnia: Secondary | ICD-10-CM | POA: Diagnosis not present

## 2014-09-10 DIAGNOSIS — Z9622 Myringotomy tube(s) status: Secondary | ICD-10-CM

## 2014-09-10 DIAGNOSIS — T829XXA Unspecified complication of cardiac and vascular prosthetic device, implant and graft, initial encounter: Secondary | ICD-10-CM

## 2014-09-10 DIAGNOSIS — I5022 Chronic systolic (congestive) heart failure: Secondary | ICD-10-CM

## 2014-09-10 DIAGNOSIS — E7220 Disorder of urea cycle metabolism, unspecified: Secondary | ICD-10-CM | POA: Diagnosis not present

## 2014-09-10 DIAGNOSIS — R570 Cardiogenic shock: Secondary | ICD-10-CM

## 2014-09-10 DIAGNOSIS — J690 Pneumonitis due to inhalation of food and vomit: Secondary | ICD-10-CM | POA: Diagnosis not present

## 2014-09-10 DIAGNOSIS — I749 Embolism and thrombosis of unspecified artery: Secondary | ICD-10-CM | POA: Diagnosis not present

## 2014-09-10 DIAGNOSIS — J181 Lobar pneumonia, unspecified organism: Secondary | ICD-10-CM | POA: Diagnosis not present

## 2014-09-10 DIAGNOSIS — D689 Coagulation defect, unspecified: Secondary | ICD-10-CM | POA: Diagnosis not present

## 2014-09-10 DIAGNOSIS — F419 Anxiety disorder, unspecified: Secondary | ICD-10-CM | POA: Diagnosis not present

## 2014-09-10 DIAGNOSIS — Z452 Encounter for adjustment and management of vascular access device: Secondary | ICD-10-CM

## 2014-09-10 DIAGNOSIS — Z4509 Encounter for adjustment and management of other cardiac device: Secondary | ICD-10-CM | POA: Diagnosis not present

## 2014-09-10 DIAGNOSIS — E876 Hypokalemia: Secondary | ICD-10-CM | POA: Diagnosis not present

## 2014-09-10 DIAGNOSIS — E785 Hyperlipidemia, unspecified: Secondary | ICD-10-CM | POA: Diagnosis present

## 2014-09-10 DIAGNOSIS — R001 Bradycardia, unspecified: Secondary | ICD-10-CM | POA: Diagnosis not present

## 2014-09-10 DIAGNOSIS — Z87891 Personal history of nicotine dependence: Secondary | ICD-10-CM | POA: Diagnosis not present

## 2014-09-10 DIAGNOSIS — D72829 Elevated white blood cell count, unspecified: Secondary | ICD-10-CM

## 2014-09-10 DIAGNOSIS — D473 Essential (hemorrhagic) thrombocythemia: Secondary | ICD-10-CM | POA: Diagnosis not present

## 2014-09-10 DIAGNOSIS — E43 Unspecified severe protein-calorie malnutrition: Secondary | ICD-10-CM | POA: Diagnosis not present

## 2014-09-10 DIAGNOSIS — Z9889 Other specified postprocedural states: Secondary | ICD-10-CM | POA: Diagnosis not present

## 2014-09-10 DIAGNOSIS — J9621 Acute and chronic respiratory failure with hypoxia: Secondary | ICD-10-CM | POA: Diagnosis not present

## 2014-09-10 DIAGNOSIS — I255 Ischemic cardiomyopathy: Secondary | ICD-10-CM | POA: Diagnosis present

## 2014-09-10 DIAGNOSIS — I82401 Acute embolism and thrombosis of unspecified deep veins of right lower extremity: Secondary | ICD-10-CM | POA: Diagnosis not present

## 2014-09-10 DIAGNOSIS — F411 Generalized anxiety disorder: Secondary | ICD-10-CM | POA: Diagnosis not present

## 2014-09-10 DIAGNOSIS — M459 Ankylosing spondylitis of unspecified sites in spine: Secondary | ICD-10-CM

## 2014-09-10 DIAGNOSIS — R042 Hemoptysis: Secondary | ICD-10-CM | POA: Diagnosis not present

## 2014-09-10 DIAGNOSIS — D62 Acute posthemorrhagic anemia: Secondary | ICD-10-CM | POA: Diagnosis not present

## 2014-09-10 DIAGNOSIS — J479 Bronchiectasis, uncomplicated: Secondary | ICD-10-CM | POA: Diagnosis not present

## 2014-09-10 DIAGNOSIS — I314 Cardiac tamponade: Secondary | ICD-10-CM | POA: Diagnosis not present

## 2014-09-10 DIAGNOSIS — R059 Cough, unspecified: Secondary | ICD-10-CM

## 2014-09-10 DIAGNOSIS — J90 Pleural effusion, not elsewhere classified: Secondary | ICD-10-CM

## 2014-09-10 DIAGNOSIS — J9622 Acute and chronic respiratory failure with hypercapnia: Secondary | ICD-10-CM | POA: Diagnosis not present

## 2014-09-10 DIAGNOSIS — R0602 Shortness of breath: Secondary | ICD-10-CM | POA: Diagnosis not present

## 2014-09-10 DIAGNOSIS — Z8249 Family history of ischemic heart disease and other diseases of the circulatory system: Secondary | ICD-10-CM | POA: Diagnosis not present

## 2014-09-10 DIAGNOSIS — I213 ST elevation (STEMI) myocardial infarction of unspecified site: Secondary | ICD-10-CM | POA: Diagnosis not present

## 2014-09-10 DIAGNOSIS — I5023 Acute on chronic systolic (congestive) heart failure: Secondary | ICD-10-CM

## 2014-09-10 DIAGNOSIS — R0902 Hypoxemia: Secondary | ICD-10-CM | POA: Diagnosis not present

## 2014-09-10 DIAGNOSIS — Z79899 Other long term (current) drug therapy: Secondary | ICD-10-CM | POA: Diagnosis not present

## 2014-09-10 DIAGNOSIS — J942 Hemothorax: Secondary | ICD-10-CM | POA: Diagnosis not present

## 2014-09-10 DIAGNOSIS — J939 Pneumothorax, unspecified: Secondary | ICD-10-CM

## 2014-09-10 DIAGNOSIS — I10 Essential (primary) hypertension: Secondary | ICD-10-CM | POA: Diagnosis present

## 2014-09-10 DIAGNOSIS — I509 Heart failure, unspecified: Secondary | ICD-10-CM

## 2014-09-10 DIAGNOSIS — N179 Acute kidney failure, unspecified: Secondary | ICD-10-CM | POA: Diagnosis not present

## 2014-09-10 DIAGNOSIS — J9610 Chronic respiratory failure, unspecified whether with hypoxia or hypercapnia: Secondary | ICD-10-CM | POA: Diagnosis not present

## 2014-09-10 DIAGNOSIS — Z7901 Long term (current) use of anticoagulants: Secondary | ICD-10-CM | POA: Diagnosis not present

## 2014-09-10 DIAGNOSIS — K567 Ileus, unspecified: Secondary | ICD-10-CM

## 2014-09-10 DIAGNOSIS — Z9689 Presence of other specified functional implants: Secondary | ICD-10-CM

## 2014-09-10 DIAGNOSIS — R5381 Other malaise: Secondary | ICD-10-CM | POA: Diagnosis not present

## 2014-09-10 DIAGNOSIS — J189 Pneumonia, unspecified organism: Secondary | ICD-10-CM | POA: Diagnosis not present

## 2014-09-10 DIAGNOSIS — D75838 Other thrombocytosis: Secondary | ICD-10-CM

## 2014-09-10 DIAGNOSIS — Z4659 Encounter for fitting and adjustment of other gastrointestinal appliance and device: Secondary | ICD-10-CM

## 2014-09-10 DIAGNOSIS — K8020 Calculus of gallbladder without cholecystitis without obstruction: Secondary | ICD-10-CM | POA: Diagnosis not present

## 2014-09-10 DIAGNOSIS — K760 Fatty (change of) liver, not elsewhere classified: Secondary | ICD-10-CM | POA: Diagnosis not present

## 2014-09-10 DIAGNOSIS — R0689 Other abnormalities of breathing: Secondary | ICD-10-CM | POA: Diagnosis not present

## 2014-09-10 DIAGNOSIS — Z4682 Encounter for fitting and adjustment of non-vascular catheter: Secondary | ICD-10-CM | POA: Diagnosis not present

## 2014-09-10 DIAGNOSIS — R58 Hemorrhage, not elsewhere classified: Secondary | ICD-10-CM | POA: Diagnosis not present

## 2014-09-10 DIAGNOSIS — J9601 Acute respiratory failure with hypoxia: Secondary | ICD-10-CM | POA: Diagnosis not present

## 2014-09-10 DIAGNOSIS — Z95811 Presence of heart assist device: Secondary | ICD-10-CM | POA: Diagnosis not present

## 2014-09-10 DIAGNOSIS — N390 Urinary tract infection, site not specified: Secondary | ICD-10-CM | POA: Diagnosis not present

## 2014-09-10 DIAGNOSIS — J811 Chronic pulmonary edema: Secondary | ICD-10-CM | POA: Diagnosis not present

## 2014-09-10 DIAGNOSIS — T85598A Other mechanical complication of other gastrointestinal prosthetic devices, implants and grafts, initial encounter: Secondary | ICD-10-CM

## 2014-09-10 DIAGNOSIS — J948 Other specified pleural conditions: Secondary | ICD-10-CM | POA: Diagnosis not present

## 2014-09-10 DIAGNOSIS — E119 Type 2 diabetes mellitus without complications: Secondary | ICD-10-CM | POA: Diagnosis not present

## 2014-09-10 DIAGNOSIS — E874 Mixed disorder of acid-base balance: Secondary | ICD-10-CM | POA: Diagnosis not present

## 2014-09-10 DIAGNOSIS — I2589 Other forms of chronic ischemic heart disease: Secondary | ICD-10-CM | POA: Diagnosis not present

## 2014-09-10 DIAGNOSIS — E872 Acidosis: Secondary | ICD-10-CM | POA: Diagnosis not present

## 2014-09-10 DIAGNOSIS — I517 Cardiomegaly: Secondary | ICD-10-CM | POA: Diagnosis not present

## 2014-09-10 DIAGNOSIS — I8291 Chronic embolism and thrombosis of unspecified vein: Secondary | ICD-10-CM | POA: Diagnosis not present

## 2014-09-10 HISTORY — DX: Elevated white blood cell count, unspecified: D72.829

## 2014-09-10 HISTORY — PX: RIGHT HEART CATHETERIZATION: SHX5447

## 2014-09-10 HISTORY — DX: Other specified abnormal findings of blood chemistry: R79.89

## 2014-09-10 HISTORY — DX: Other thrombocytosis: D75.838

## 2014-09-10 LAB — COMPREHENSIVE METABOLIC PANEL
ALT: 118 U/L — ABNORMAL HIGH (ref 0–53)
AST: 83 U/L — AB (ref 0–37)
Albumin: 2.8 g/dL — ABNORMAL LOW (ref 3.5–5.2)
Alkaline Phosphatase: 322 U/L — ABNORMAL HIGH (ref 39–117)
Anion gap: 9 (ref 5–15)
BUN: 28 mg/dL — AB (ref 6–23)
CALCIUM: 8.6 mg/dL (ref 8.4–10.5)
CHLORIDE: 103 mmol/L (ref 96–112)
CO2: 30 mmol/L (ref 19–32)
CREATININE: 1.35 mg/dL (ref 0.50–1.35)
GFR calc Af Amer: 62 mL/min — ABNORMAL LOW (ref >90)
GFR calc non Af Amer: 53 mL/min — ABNORMAL LOW (ref >90)
GLUCOSE: 127 mg/dL — AB (ref 70–99)
Potassium: 5.5 mmol/L — ABNORMAL HIGH (ref 3.5–5.1)
Sodium: 142 mmol/L (ref 135–145)
Total Bilirubin: 0.8 mg/dL (ref 0.3–1.2)
Total Protein: 7.3 g/dL (ref 6.0–8.3)

## 2014-09-10 LAB — POCT I-STAT 3, VENOUS BLOOD GAS (G3P V)
ACID-BASE EXCESS: 2 mmol/L (ref 0.0–2.0)
ACID-BASE EXCESS: 4 mmol/L — AB (ref 0.0–2.0)
BICARBONATE: 28.7 meq/L — AB (ref 20.0–24.0)
BICARBONATE: 30.2 meq/L — AB (ref 20.0–24.0)
O2 Saturation: 38 %
O2 Saturation: 38 %
TCO2: 30 mmol/L (ref 0–100)
TCO2: 32 mmol/L (ref 0–100)
pCO2, Ven: 51.2 mmHg — ABNORMAL HIGH (ref 45.0–50.0)
pCO2, Ven: 53.6 mmHg — ABNORMAL HIGH (ref 45.0–50.0)
pH, Ven: 7.356 — ABNORMAL HIGH (ref 7.250–7.300)
pH, Ven: 7.358 — ABNORMAL HIGH (ref 7.250–7.300)
pO2, Ven: 24 mmHg — CL (ref 30.0–45.0)
pO2, Ven: 24 mmHg — CL (ref 30.0–45.0)

## 2014-09-10 LAB — PROTIME-INR
INR: 2.77 — ABNORMAL HIGH (ref 0.00–1.49)
PROTHROMBIN TIME: 29.5 s — AB (ref 11.6–15.2)

## 2014-09-10 LAB — CARBOXYHEMOGLOBIN
CARBOXYHEMOGLOBIN: 1.2 % (ref 0.5–1.5)
Methemoglobin: 0.8 % (ref 0.0–1.5)
O2 Saturation: 53.4 %
TOTAL HEMOGLOBIN: 10.4 g/dL — AB (ref 13.5–18.0)

## 2014-09-10 LAB — CBC
HEMATOCRIT: 35.1 % — AB (ref 39.0–52.0)
HEMOGLOBIN: 10.7 g/dL — AB (ref 13.0–17.0)
MCH: 28.2 pg (ref 26.0–34.0)
MCHC: 30.5 g/dL (ref 30.0–36.0)
MCV: 92.6 fL (ref 78.0–100.0)
Platelets: 1013 10*3/uL (ref 150–400)
RBC: 3.79 MIL/uL — ABNORMAL LOW (ref 4.22–5.81)
RDW: 17.5 % — AB (ref 11.5–15.5)
WBC: 13.2 10*3/uL — ABNORMAL HIGH (ref 4.0–10.5)

## 2014-09-10 LAB — BRAIN NATRIURETIC PEPTIDE: B NATRIURETIC PEPTIDE 5: 2173.4 pg/mL — AB (ref 0.0–100.0)

## 2014-09-10 LAB — MAGNESIUM: Magnesium: 2.5 mg/dL (ref 1.5–2.5)

## 2014-09-10 SURGERY — RIGHT HEART CATH
Anesthesia: LOCAL

## 2014-09-10 MED ORDER — ONDANSETRON HCL 4 MG/2ML IJ SOLN: 4.0000 mg | Freq: Four times a day (QID) | INTRAMUSCULAR | Status: DC | PRN

## 2014-09-10 MED ORDER — SODIUM CHLORIDE 0.9 % IJ SOLN: 3.0000 mL | Freq: Two times a day (BID) | INTRAMUSCULAR | Status: DC

## 2014-09-10 MED ORDER — MIDAZOLAM HCL 2 MG/2ML IJ SOLN
INTRAMUSCULAR | Status: AC
  Filled 2014-09-10: qty 2

## 2014-09-10 MED ORDER — SODIUM CHLORIDE 0.9 % IJ SOLN: 3.0000 mL | INTRAMUSCULAR | Status: DC | PRN

## 2014-09-10 MED ORDER — PERFLUTREN LIPID MICROSPHERE
1.0000 mL | INTRAVENOUS | Status: AC | PRN
  Administered 2014-09-10: 1 mL via INTRAVENOUS
  Filled 2014-09-10: qty 10

## 2014-09-10 MED ORDER — WARFARIN - PHARMACIST DOSING INPATIENT
Freq: Every day | Status: DC
  Administered 2014-09-11 – 2014-09-12 (×2)

## 2014-09-10 MED ORDER — SODIUM CHLORIDE 0.9 % IV SOLN: 250.0000 mL | INTRAVENOUS | Status: DC | PRN

## 2014-09-10 MED ORDER — WARFARIN SODIUM 4 MG PO TABS
4.0000 mg | ORAL_TABLET | Freq: Once | ORAL | Status: AC
  Administered 2014-09-10: 4 mg via ORAL
  Filled 2014-09-10: qty 1

## 2014-09-10 MED ORDER — ASPIRIN 81 MG PO CHEW: 81.0000 mg | CHEWABLE_TABLET | ORAL | Status: DC

## 2014-09-10 MED ORDER — FENTANYL CITRATE 0.05 MG/ML IJ SOLN
INTRAMUSCULAR | Status: AC
  Filled 2014-09-10: qty 2

## 2014-09-10 MED ORDER — MILRINONE IN DEXTROSE 20 MG/100ML IV SOLN
0.3000 ug/kg/min | INTRAVENOUS | Status: DC
  Administered 2014-09-10 – 2014-09-22 (×19): 0.25 ug/kg/min via INTRAVENOUS
  Administered 2014-09-23 – 2014-09-24 (×2): 0.3 ug/kg/min via INTRAVENOUS
  Filled 2014-09-10 (×23): qty 100

## 2014-09-10 MED ORDER — ACETAMINOPHEN 325 MG PO TABS
650.0000 mg | ORAL_TABLET | ORAL | Status: DC | PRN
  Administered 2014-09-12 – 2014-09-20 (×6): 650 mg via ORAL
  Filled 2014-09-10 (×6): qty 2

## 2014-09-10 MED ORDER — SODIUM CHLORIDE 0.9 % IV SOLN
250.0000 mL | INTRAVENOUS | Status: DC | PRN
  Administered 2014-09-12 – 2014-09-20 (×3): 250 mL via INTRAVENOUS
  Administered 2014-09-21: 08:00:00 via INTRAVENOUS

## 2014-09-10 MED ORDER — HEPARIN (PORCINE) IN NACL 2-0.9 UNIT/ML-% IJ SOLN
INTRAMUSCULAR | Status: AC
  Filled 2014-09-10: qty 500

## 2014-09-10 MED ORDER — FUROSEMIDE 10 MG/ML IJ SOLN
80.0000 mg | Freq: Two times a day (BID) | INTRAMUSCULAR | Status: DC
  Administered 2014-09-10 – 2014-09-11 (×3): 80 mg via INTRAVENOUS
  Filled 2014-09-10 (×6): qty 8

## 2014-09-10 MED ORDER — SODIUM CHLORIDE 0.9 % IV SOLN: INTRAVENOUS | Status: DC

## 2014-09-10 MED ORDER — PERFLUTREN LIPID MICROSPHERE
INTRAVENOUS | Status: AC
  Filled 2014-09-10: qty 10

## 2014-09-10 MED ORDER — LIDOCAINE HCL (PF) 1 % IJ SOLN
INTRAMUSCULAR | Status: AC
  Filled 2014-09-10: qty 30

## 2014-09-10 MED ORDER — SODIUM CHLORIDE 0.9 % IJ SOLN
3.0000 mL | Freq: Two times a day (BID) | INTRAMUSCULAR | Status: DC
  Administered 2014-09-10 – 2014-09-17 (×8): 3 mL via INTRAVENOUS

## 2014-09-10 NOTE — Consult Note (Signed)
Referring MD: Dr Pierre Bali  PCP:  [name], MD   Reason for Referral: Elevated Platelet count     HPI:  This is a hematology consultation requested to evaluate this man for an elevated platelet count.  69 year old man with known coronary artery disease 3 years status post myocardial infarction and placement of coronary stents. He presented to this hospital on 06/12/2014 with an acute anterior wall myocardial infarction. He was hypotensive and bradycardic. He underwent emergency catheterization and was found to have an occluded left anterior descending artery stent. A new drug eluting stent was placed. He became hypotensive again  after the procedure. Estimated ejection fraction only 20%. He had to be put on an intra-aortic balloon pump until otherwise stable. He also developed ventricular tachycardia. He required intubation and cardioversion. He was readmitted to [facility] on 08/22/2014 with dyspnea and hemoptysis and found to have bilateral pulmonary emboli. He was put on full dose anticoagulation initially with heparin then with warfarin. He was transferred to this institution for further evaluation on February 25.  Lower extremity Doppler showed a right leg DVT. He was severely hypoxic and required a nonrebreather oxygen mask. A prophylactic vena cava filter was placed in view of his large clot burden, right heart strain with concomitant advanced left-sided heart failure. His condition was stabilized and he was discharged on March 3.  He was readmitted this morning after a follow-up visit in the heart failure clinic when he reported increasing shortness of breath. Further evaluation upon interrogation of a LifeVest device showed a 2 minute episode of bradycardia. He was admitted for a right heart catheterization and consideration of placement of a ventricular assist device.  Lab data available in this institution dates back to his initial admission here on  06/12/2014 when at the time of an acute MI his hemoglobin was 13.6, hematocrit 40, white count 19,600, 93% neutrophils, and platelet count 422,000. Since that time, he has had a persistent leukocytosis with peak white count 22,000 and a persistent thrombocytosis. Platelet count up to 505,000 on March 2 and is over 1 million today. Not surprisingly, given the events of the last 3 months, his hemoglobin has drifted down to current value of 10.7 g with MCV 92.6. White count today actually lower than his recent baseline at 13,200.  He has had no fever and and no signs of infection. He had a small right pleural effusion at time of his acute pulmonary embolus 2 weeks ago and current chest radiograph shows a right basilar infiltrate with an associated small effusion which appears improved compared with February 28 study.  He has an underlying rheumatologic disease, ankylosing spondylitis. He has been on a number of anti-inflammatory agents including Enbrel and Indocin over the years.  Strong family history of coronary disease 5 brothers and a sister all died of complications of cardiac disease. Sister also had a colostomy for Crohn's colitis and had a renal transplant for end-stage renal disease. No family history of any bone marrow disorder.       Past Medical History  Diagnosis Date  . Hyperlipidemia   . Hypertension   . Diabetes   . Nephrolithiasis   . Ankylosing spondylitis   . CAD (coronary artery disease)     a. stenting of LCx 2013; b. STEMI 06/12/14 s/p PCI to LAD complicated by post cath shock requiring IABP; VT s/p DCCV, EF 20%; c. NSTEMI 06/26/14 treated medically.  . Ischemic cardiomyopathy     a. echo  08/23/2014 EF <20%, dilated CM, mod MR/TR  . Acute on chronic respiratory failure     a. 08/2014 in setting of PE.  . Bilateral pulmonary embolism     a. 08/2014 - started on Coumadin. Retrievable IVC filter placed 08/27/14 due to RV strain and large clot burden.  . Right leg DVT     a.  08/2014.  Marland Kitchen Chronic systolic CHF (congestive heart failure)   . Hypotension   . Hemoptysis     a. 08/2014 possibly due to pulm infarct/PE.  Marland Kitchen Pleural effusion on right 08/2014 - small  . Leukocytosis   . Carotid artery disease     a. s/p stenting.  . Ventricular tachycardia     a. 06/2014 at time of MI, s/p DCCV.  :  Past Surgical History  Procedure Laterality Date  . Left heart catheterization with coronary angiogram N/A 06/12/2014    Procedure: LEFT HEART CATHETERIZATION WITH CORONARY ANGIOGRAM;  Surgeon: Lorretta Harp, MD;  Location: [facility] CATH LAB;  Service: Cardiovascular;  Laterality: N/A;  :  . [MAR Hold] furosemide  80 mg Intravenous BID  . [MAR Hold] sodium chloride  3 mL Intravenous Q12H  :  No Active Allergies:  Family History: See history of present illness   Problem Relation Age of Onset  . Hypertension Mother   :  History  Married. No children. He denies any history of exposure to toxic chemicals or radiation. Social History  . Marital Status: Married    Spouse Name: N/A  . Number of Children: N/A  . Years of Education: N/A   Occupational History  . Not on file.   Social History Main Topics  . Smoking status: Former Smoker    Quit date: 06/12/1973  . Smokeless tobacco: Not on file  . Alcohol Use: No  . Drug Use: No  . Sexual Activity: Not on file   Other Topics Concern  . Not on file   Social History Narrative  :  ROS: He sees Dr. Jefm Bryant for his ankylosing spondylitis See HPI  Vitals: Filed Vitals:   09/10/14 1330  BP:   Pulse: 46  Temp:   Resp:     PHYSICAL EXAM: General appearance: well nourished caucasian man HEENT: right IJ line; no mass, ulcer, exudate in oropharynx Lymph Nodes:no cervical, supraclavicular, or axillary adenopathy Resp: rales right lung base; left clear Cardio: regular rhythm; no murmur or gallop Vascular: no cyanosis; DP pulses 1+ symmetric; left carotid - no bruit Breasts: GI: abdomen soft, non-tender, no  mass, no organomegaly GU: Extremities: 1+ pedal edema Neurologic: alert, oriented Skin:no ecchymoses or petecchiae  Labs:   Recent Labs  09/10/14 1000  WBC 13.2*  HGB 10.7*  HCT 35.1*  PLT 1013*    Recent Labs  09/10/14 1000  NA 142  K 5.5*  CL 103  CO2 30  GLUCOSE 127*  BUN 28*  CREATININE 1.35  CALCIUM 8.6    Blood smear review: Normochromic normocytic red cells. Neutrophils appear mature. There is a rare myelocyte and I saw a single nucleated red blood cell. Platelets are increased in number,  normal in morphology.  Images Studies/Results: See discussion above  Impression: I believe we are seeing a reactive/secondary thrombocytosis in a man with an underlying chronic inflammatory disorder (ankylosing spondylitis), with baseline elevations of his white count and platelet count now exaggerated by his acute illness with multiple complications as outlined above. There may still be some reactive changes in his right lung with recent pulmonary  emboli/infarction. There may also be a inflammatory component associated with recent placement of a caval filter (foreign body reaction). There may  be an element of reactive thrombocytosis from subacute blood loss over the last 2 months. There is no suspicion at this point that he has an underlying myeloproliferative disorder.  Recommendation: In general, we do not anticoagulate or use platelet lowering drugs for a secondary thrombocytosis. Since he is already on full dose anticoagulation, including antiplatelet agents, this is a moot point so that if he were at risk of thrombosis due to the elevated platelet count he is fully covered. There is no indication to begin platelet lowering drugs.  I have no specific recommendations at this time. I would consider routine iron supplementation. Blood tests for iron and ferritin will be unreliable in the setting of acute illness and inflammation and will not be helpful in making a decision on  whether or not he should be on iron.               Murriel Hopper, MD, [facility]  Hematology-Oncology/Internal Medicine  09/10/2014, 2:10 PM

## 2014-09-10 NOTE — Progress Notes (Signed)
Patient ID: Scott [name], male   DOB: Sep 28, 1945, 69 y.o.   MRN: [id number]  Primary Cardiologist: Dr Haroldine Laws INR : [facility]  HPI: 69 y/o with h/o CAD with stenting of LCX 2013, DM2, carotid stenting and ankylosing spondylitis.. Admitted 06/1214 with anterior STEMI with occluded LAD. Post-cath developed shock and IABP placed. ECHO 06/12/2014. EF 20%. Then developed VT and respiratory failure.  IABP placed. Developed VT and respiratory failure post cath. Cardioverted and intubated. Amio started .Discharged home 12/17. Weight 212.   Admitted to [facility] with dyspnea --> bilateral PE + DVT. Transferred to [facility] for further management. Had IVC filter placed 2//26. Prior to discharge he requested a limited code status allowing CPR/shock except wants to be DNI. His wife is well aware of his wishes as well and knows to convey these clearly to the team in the event of an emergency. He was discharged on coumadin + plavix for 6 months them stop coumadin and resume asa 81 + plavix. Discharge weight was 195 pounds.   Follow-up: Overall doing poorly. Complaining of fatigue. Increased lower extremity edema. Ongoing dyspnea with exertion. On 3/4 had low BP and pulse so he was instructed to stop spiro, hold carvedilol, and corlanor. Weight at home 195-198. On Monday he did not take lasix because his weight was 196 pounds. Yesterday he took 40 mg of lasix and had limited urine outFollow-up:  Now back on carvedilol, corlanor and losartan. Sleeping in a recliner. Wearing Life Vest. Coughing a little blood daily. Denies BRBPR.  Having diarrhea 3-4 times a day. This am while sleeping had 2 min period of near asystole with HR < 20. Sleeping at time  Labs:  12/17 K+ 4.1 Creatinine 0.93 12/28 K+ 4.0 Creatinine 1.20  09/01/2014: K 4.1 Creatinine 1.09   ROS: All systems negative except as listed in HPI, PMH and Problem List.  SH:  History   Social History  . Marital Status: Married    Spouse Name: N/A  . Number  of Children: N/A  . Years of Education: N/A   Occupational History  . Not on file.   Social History Main Topics  . Smoking status: Former Smoker    Quit date: 06/12/1973  . Smokeless tobacco: Not on file  . Alcohol Use: No  . Drug Use: No  . Sexual Activity: Not on file   Other Topics Concern  . Not on file   Social History Narrative    FH:  Family History  Problem Relation Age of Onset  . Hypertension Mother     Past Medical History  Diagnosis Date  . Hyperlipidemia   . Hypertension   . Diabetes   . Nephrolithiasis   . Ankylosing spondylitis   . CAD (coronary artery disease)     a. stenting of LCx 2013; b. STEMI 06/12/14 s/p PCI to LAD complicated by post cath shock requiring IABP; VT s/p DCCV, EF 20%; c. NSTEMI 06/26/14 treated medically.  . Ischemic cardiomyopathy     a. echo 08/23/2014 EF <20%, dilated CM, mod MR/TR  . Acute on chronic respiratory failure     a. 08/2014 in setting of PE.  . Bilateral pulmonary embolism     a. 08/2014 - started on Coumadin. Retrievable IVC filter placed 08/27/14 due to RV strain and large clot burden.  . Right leg DVT     a. 08/2014.  Marland Kitchen Chronic systolic CHF (congestive heart failure)   . Hypotension   . Hemoptysis     a. 08/2014  possibly due to pulm infarct/PE.  Marland Kitchen Pleural effusion on right 08/2014 - small  . Leukocytosis   . Carotid artery disease     a. s/p stenting.  . Ventricular tachycardia     a. 06/2014 at time of MI, s/p DCCV.    Current Outpatient Prescriptions  Medication Sig Dispense Refill  . acetaminophen (TYLENOL) 500 MG tablet Take 1,000 mg by mouth every 6 (six) hours as needed.    . carvedilol (COREG) 6.25 MG tablet Take 0.5 tablets (3.125 mg total) by mouth 2 (two) times daily. 180 tablet 3  . clopidogrel (PLAVIX) 75 MG tablet Take 1 tablet (75 mg total) by mouth daily with breakfast. 90 tablet 3  . digoxin (LANOXIN) 0.125 MG tablet Take 1 tablet (0.125 mg total) by mouth daily. 90 tablet 3  . furosemide  (LASIX) 40 MG tablet Take 1 tablet (40mg ) by mouth on Mondays and Fridays. Hold if weight is 196 lbs or less. 30 tablet 2  . ivabradine (CORLANOR) 5 MG TABS tablet Take 1 tablet (5 mg total) by mouth 2 (two) times daily with a meal. 60 tablet 3  . loperamide (IMODIUM) 1 MG/5ML solution Take 2 mg by mouth as needed for diarrhea or loose stools.    Marland Kitchen losartan (COZAAR) 25 MG tablet Take 0.5 tablets (12.5 mg total) by mouth daily. Only takes when BP is higher then 90 SBP 30 tablet 6  . metaxalone (SKELAXIN) 800 MG tablet Take 800 mg by mouth daily as needed. As needed    . Multiple Vitamins-Minerals (MULTIVITAMIN WITH MINERALS) tablet Take 1 tablet by mouth daily.    . nitroGLYCERIN (NITROSTAT) 0.3 MG SL tablet Place 0.3 mg under the tongue every 5 (five) minutes as needed for chest pain.    . pantoprazole (PROTONIX) 40 MG tablet Take 1 tablet (40 mg total) by mouth daily. 90 tablet 3  . potassium chloride 20 MEQ TBCR Take 1 tablet (38meq) by mouth on Mondays and Fridays (with Lasix). Hold if weight is 196 lbs or less. 30 tablet 2  . rosuvastatin (CRESTOR) 10 MG tablet Take 10 mg by mouth daily.    Marland Kitchen SIMETHICONE PO Take 1 tablet by mouth daily as needed (bloating). Take 1 chewable tablet of [facility] [employer] as needed for bloating.    . traZODone (DESYREL) 100 MG tablet Take 50 mg by mouth at bedtime.    Marland Kitchen warfarin (COUMADIN) 4 MG tablet Take 1 tablet (4 mg total) by mouth daily. 30 tablet 2  . zolpidem (AMBIEN) 10 MG tablet Take 1 tablet (10 mg total) by mouth at bedtime as needed for sleep. 20 tablet 3   No current facility-administered medications for this encounter.    Filed Vitals:   09/10/14 0820  BP: 92/66  Pulse: 76  Weight: 203 lb (92.08 kg)  SpO2: 98%    PHYSICAL EXAM:  General:  Fatigued appearing.  No resp difficulty HEENT: normal Neck: supple. JVP to jaw. Carotids 2+ bilaterally; no bruits. No lymphadenopathy or thryomegaly appreciated. Cor: PMI normal. Regular rate & rhythm.  No rubs, gallops or murmurs. + lifevest on Lungs: clear Abdomen: soft, nontender, nondistended. No hepatosplenomegaly. No bruits or masses. Good bowel sounds. Extremities: no cyanosis, clubbing, rash, R and LLE 3+ edema Neuro: alert & orientedx3, cranial nerves grossly intact. Moves all 4 extremities w/o difficulty. Affect pleasant.   ASSESSMENT & PLAN: 1. Chronic systolic HF. EF 20% noted 06/2014. ICM NYHA IIIb. -Volume status elevated. Will need to diurese with IV lasix. Keep  off spironolactone.  - BP low at home. Stop carvedilol.  - Continue  dig 0.125 mg daily -Stop losartan  daily - Stop ivabradine 5 mg twice a day.  Today we are concerned about low output. Will need RHC to assess hemodynamics. May require home inotropes.  2. CAD s/p Anterior STEMI on 06/12/14 with stenting of LAD- on coumadin + plavix. On statin and bb.  3. 06/12/14 VT in setting of STEMI 4. Ankylosing spondylitis. 5. R DVT- on coumadin  6. Bilateral PE- S/P IVC filter 08/27/2014--->coumadin + plavix for 6 months (until September)  them stop coumadin and resume asa 81 + plavix. 7. Asystole noted from Life Vest this am.  8. Full Code  Will need to admit for diuresis and RHC.  CLEGG,AMY, NP-C 8:22 AM   Patient seen and examined with Darrick Grinder, NP. We discussed all aspects of the encounter. I agree with the assessment and plan as stated above.   He is increasingly ill in the setting of severe systolic HF and recent PE. He has volume overload and appears low out. This am with 2 min period of near asystole likely due to underlying OSA. Will admit to ICU and plan RHC. EP has been consulted to discuss candidacy for device. QRS 142ms so not candidate for CRT unless he will have high burden of RV pacing. He has changed his code status back to FULL CODE.  Shiven Juston Goheen,MD 9:32 AM

## 2014-09-10 NOTE — Progress Notes (Signed)
*  PRELIMINARY RESULTS* Echocardiogram 2D Echocardiogram has been performed.  [name] 09/10/2014, 3:51 PM

## 2014-09-10 NOTE — H&P (Signed)
Advanced Heart Failure Team History and Physical Note    Primary Cardiologist:  Bensimhon  Reason for Admission: HF. Asystole on LifeVest   HPI:    69 y/o with h/o CAD with stenting of LCX 2013, DM2, carotid stenting and ankylosing spondylitis.. Admitted 06/1214 with anterior STEMI with occluded LAD. Post-cath developed shock and IABP placed. ECHO 06/12/2014. EF 20%. Then developed VT and respiratory failure. IABP placed. Developed VT and respiratory failure post cath. Cardioverted and intubated. Amio started .Discharged home 12/17. Weight 212.   I February 2/16 admitted to [facility] with dyspnea --> bilateral PE + DVT. Transferred to [facility] for further management. Started on heparin and warfarin. Had IVC filter placed 2//26.He was discharged on coumadin + plavix. Discharge weight was 195 pounds.   Was seen in HF Clinic this am. Overall doing poorly. Complaining of fatigue. Increased lower extremity edema. Ongoing dyspnea with exertion. On 3/4 had low BP and pulse so he was instructed to stop spiro, hold carvedilol, and corlanor. Weight at home 195-198. On Monday he did not take lasix because his weight was 196 pounds. Yesterday he took 40 mg of lasix and had limited urine outFollow-up: Now back on carvedilol, corlanor and losartan. Sleeping in a recliner. Wearing Life Vest. Coughing a little blood daily. Denies BRBPR. Having diarrhea 3-4 times a day. This am while sleeping had 2 min period of near asystole with HR < 20. Sleeping at time.   Review of Systems: [y] = yes, [ ]  = no   General: Weight gain [ y]; Weight loss [ ] ; Anorexia [ ] ; Fatigue [ y]; Fever [ ] ; Chills [ ] ; Weakness Blue.Reese ]  Cardiac: Chest pain/pressure [ ] ; Resting SOB [ ] ; Exertional SOB [ y]; Orthopnea [ ] ; Pedal Edema Blue.Reese ]; Palpitations [ ] ; Syncope [ ] ; Presyncope [ ] ; Paroxysmal nocturnal dyspnea[ ]   Pulmonary: Cough [ ] ; Wheezing[ ] ; Hemoptysis[ ] ; Sputum [ ] ; Snoring [ ]   GI: Vomiting[ ] ; Dysphagia[ ] ; Melena[ ] ;  Hematochezia [ ] ; Heartburn[ ] ; Abdominal pain [ ] ; Constipation [ ] ; Diarrhea [ ] ; BRBPR [ ]   GU: Hematuria[ ] ; Dysuria [ ] ; Nocturia[ ]   Vascular: Pain in legs with walking [ ] ; Pain in feet with lying flat [ ] ; Non-healing sores [ ] ; Stroke [ ] ; TIA [ ] ; Slurred speech [ ] ;  Neuro: Headaches[ ] ; Vertigo[ ] ; Seizures[ ] ; Paresthesias[ ] ;Blurred vision [ ] ; Diplopia [ ] ; Vision changes [ ]   Ortho/Skin: Arthritis [ y]; Joint pain Blue.Reese ]; Muscle pain [ ] ; Joint swelling [ y]; Back Pain Blue.Reese ]; Rash [ ]   Psych: Depression[y ]; Anxiety[ ]   Heme: Bleeding problems [ ] ; Clotting disorders Blue.Reese ]; Anemia [ ]   Endocrine: Diabetes [ ] ; Thyroid dysfunction[ ]   Home Medications Prior to Admission medications   Medication Sig Start Date End Date Taking? Authorizing Provider  acetaminophen (TYLENOL) 500 MG tablet Take 1,000 mg by mouth every 6 (six) hours as needed.    Historical Provider, MD  carvedilol (COREG) 6.25 MG tablet Take 0.5 tablets (3.125 mg total) by mouth 2 (two) times daily. 07/20/14   [name], MD  clopidogrel (PLAVIX) 75 MG tablet Take 1 tablet (75 mg total) by mouth daily with breakfast. 07/20/14   [name], MD  digoxin (LANOXIN) 0.125 MG tablet Take 1 tablet (0.125 mg total) by mouth daily. 07/20/14   [name], MD  furosemide (LASIX) 40 MG tablet Take 1 tablet (40mg ) by mouth on Mondays and Fridays.  Hold if weight is 196 lbs or less. 09/02/14   Dayna N Dunn, PA-C  ivabradine (CORLANOR) 5 MG TABS tablet Take 1 tablet (5 mg total) by mouth 2 (two) times daily with a meal. 08/17/14   [name], MD  loperamide (IMODIUM) 1 MG/5ML solution Take 2 mg by mouth as needed for diarrhea or loose stools.    Historical Provider, MD  losartan (COZAAR) 25 MG tablet Take 0.5 tablets (12.5 mg total) by mouth daily. Only takes when BP is higher then 90 SBP 08/17/14   [name], MD  metaxalone (SKELAXIN) 800 MG tablet Take 800 mg by mouth daily as needed. As needed     Historical Provider, MD  Multiple Vitamins-Minerals (MULTIVITAMIN WITH MINERALS) tablet Take 1 tablet by mouth daily.    Historical Provider, MD  nitroGLYCERIN (NITROSTAT) 0.3 MG SL tablet Place 0.3 mg under the tongue every 5 (five) minutes as needed for chest pain.    Historical Provider, MD  pantoprazole (PROTONIX) 40 MG tablet Take 1 tablet (40 mg total) by mouth daily. 07/20/14   [name], MD  potassium chloride 20 MEQ TBCR Take 1 tablet (67meq) by mouth on Mondays and Fridays (with Lasix). Hold if weight is 196 lbs or less. 09/02/14   Dayna N Dunn, PA-C  rosuvastatin (CRESTOR) 10 MG tablet Take 10 mg by mouth daily.    Historical Provider, MD  SIMETHICONE PO Take 1 tablet by mouth daily as needed (bloating). Take 1 chewable tablet of [facility] Gas-Ex as needed for bloating.    Historical Provider, MD  traZODone (DESYREL) 100 MG tablet Take 50 mg by mouth at bedtime.    Historical Provider, MD  warfarin (COUMADIN) 4 MG tablet Take 1 tablet (4 mg total) by mouth daily. 09/02/14   Dayna N Dunn, PA-C  zolpidem (AMBIEN) 10 MG tablet Take 1 tablet (10 mg total) by mouth at bedtime as needed for sleep. 07/20/14 09/10/14  [name], MD    Past Medical History: Past Medical History  Diagnosis Date  . Hyperlipidemia   . Hypertension   . Diabetes   . Nephrolithiasis   . Ankylosing spondylitis   . CAD (coronary artery disease)     a. stenting of LCx 2013; b. STEMI 06/12/14 s/p PCI to LAD complicated by post cath shock requiring IABP; VT s/p DCCV, EF 20%; c. NSTEMI 06/26/14 treated medically.  . Ischemic cardiomyopathy     a. echo 08/23/2014 EF <20%, dilated CM, mod MR/TR  . Acute on chronic respiratory failure     a. 08/2014 in setting of PE.  . Bilateral pulmonary embolism     a. 08/2014 - started on Coumadin. Retrievable IVC filter placed 08/27/14 due to RV strain and large clot burden.  . Right leg DVT     a. 08/2014.  Marland Kitchen Chronic systolic CHF (congestive heart failure)   .  Hypotension   . Hemoptysis     a. 08/2014 possibly due to pulm infarct/PE.  Marland Kitchen Pleural effusion on right 08/2014 - small  . Leukocytosis   . Carotid artery disease     a. s/p stenting.  . Ventricular tachycardia     a. 06/2014 at time of MI, s/p DCCV.    Past Surgical History: Past Surgical History  Procedure Laterality Date  . Left heart catheterization with coronary angiogram N/A 06/12/2014    Procedure: LEFT HEART CATHETERIZATION WITH CORONARY ANGIOGRAM;  Surgeon: Lorretta Harp, MD;  [facility] C[facility] LAB;  Service: Cardiovascular;  Laterality: N/A;    Family History: Family History  Problem Relation Age of Onset  . Hypertension Mother     Social History: History   Social History  . Marital Status: Married    Spouse Name: N/A  . Number of Children: N/A  . Years of Education: N/A   Social History Main Topics  . Smoking status: Former Smoker    Quit date: 06/12/1973  . Smokeless tobacco: Not on file  . Alcohol Use: No  . Drug Use: No  . Sexual Activity: Not on file   Other Topics Concern  . Not on file   Social History Narrative    Allergies:  No Active Allergies  Objective:    Vital Signs:   Temp:  [97.6 F (36.4 C)] 97.6 F (36.4 C) (03/11 1200) Pulse Rate:  [39-80] 46 (03/11 1330) Resp:  [19-29] 23 (03/11 1300) BP: (83-106)/(60-88) 106/88 mmHg (03/11 1300) SpO2:  [89 %-99 %] 95 % (03/11 1300) Weight:  [90.5 kg (199 lb 8.3 oz)-92.08 kg (203 lb)] 90.9 kg (200 lb 6.4 oz) (03/11 1131) Last BM Date: 09/10/14 Filed Weights   09/10/14 1130 09/10/14 1131  Weight: 90.5 kg (199 lb 8.3 oz) 90.9 kg (200 lb 6.4 oz)    Physical Exam: General: Fatigued appearing. No resp difficulty HEENT: normal Neck: supple. JVP to jaw. Carotids 2+ bilaterally; no bruits. No lymphadenopathy or thryomegaly appreciated. Cor: PMI normal. Regular rate & rhythm. +s3 + lifevest on Lungs: clear Abdomen: soft, nontender, nondistended. No hepatosplenomegaly. No bruits or  masses. Good bowel sounds. Extremities: no cyanosis, clubbing, rash, R and LLE 3+ edema Neuro: alert & orientedx3, cranial nerves grossly intact. Moves all 4 extremities w/o difficulty. Affect pleasant.  Telemetry: sinus rhythm with alternating periods of tachycardia and bradycardia  Labs: Basic Metabolic Panel:  Recent Labs Lab 09/10/14 1000  NA 142  K 5.5*  CL 103  CO2 30  GLUCOSE 127*  BUN 28*  CREATININE 1.35  CALCIUM 8.6  MG 2.5    Liver Function Tests:  Recent Labs Lab 09/10/14 1000  AST 83*  ALT 118*  ALKPHOS 322*  BILITOT 0.8  PROT 7.3  ALBUMIN 2.8*   No results for input(s): LIPASE, AMYLASE in the last 168 hours. No results for input(s): AMMONIA in the last 168 hours.  CBC:  Recent Labs Lab 09/10/14 1000  WBC 13.2*  HGB 10.7*  HCT 35.1*  MCV 92.6  PLT 1013*    Cardiac Enzymes: No results for input(s): CKTOTAL, CKMB, CKMBINDEX, TROPONINI in the last 168 hours.  BNP: BNP (last 3 results)  Recent Labs  07/20/14 1053 08/27/14 0253 09/10/14 1000  BNP 773.6* 1971.6* 2173.4*    ProBNP (last 3 results) No results for input(s): PROBNP in the last 8760 hours.   CBG: No results for input(s): GLUCAP in the last 168 hours.  Coagulation Studies:  Recent Labs  09/08/14 1129 09/10/14 1000  LABPROT  --  29.5*  INR 3.5 2.77*    Other results: EKG: pending  Imaging:  No results found.      Assessment:   1. A/C systolic HF with cardiogenic shock- EF 20% noted 06/2014. I 2. CAD s/p Anterior STEMI on 06/12/14 with stenting of LAD- on coumadin + plavix. On statin and bb.  3. 06/12/14 VT in setting of STEMI 4. Ankylosing spondylitis. 5. Bilateral PE with R DVT- S/P IVC filter 08/27/2014--->coumadin + plavix  6. Asystole noted from Life Vest this am.  7. Full Code  Plan/Discussion:  He is increasingly ill in the setting of severe systolic HF and recent PE. He has volume overload and appears low output. This am with 2 min  period of near asystole likely due to underlying OSA captured on LifeVest. Will admit to ICU and plan RHC. EP has been consulted to discuss candidacy for device. QRS 151ms so not candidate for CRT unless he will have high burden of RV pacing. He has changed his code status back to FULL CODE.  Neville Bensimhon,MD 2:32 PM  Length of Stay: 0 Advanced Heart Failure Team Pager [phone] (M-F; 7a - 4p)  Please contact [facility] for night-coverage after hours (4p -7a ) and weekends on [email or web address]

## 2014-09-10 NOTE — Progress Notes (Signed)
ANTICOAGULATION CONSULT NOTE - Initial Consult  Pharmacy Consult for Coumadin Indication: hx bilateral PE and RLE DVT  No Active Allergies  Patient Measurements: Height: 5\' 8"  (172.7 cm) Weight: 200 lb 6.4 oz (90.9 kg) IBW/kg (Calculated) : 68.4   Vital Signs: Temp: 97.6 F (36.4 C) (03/11 1200) Temp Source: Oral (03/11 1200) BP: 106/88 mmHg (03/11 1300) Pulse Rate: 46 (03/11 1330)  Labs:  Recent Labs  09/08/14 1129 09/10/14 1000  HGB  --  10.7*  HCT  --  35.1*  PLT  --  1013*  LABPROT  --  29.5*  INR 3.5 2.77*  CREATININE  --  1.35    Estimated Creatinine Clearance: 58.9 mL/min (by C-G formula based on Cr of 1.35).   Medical History: Past Medical History  Diagnosis Date  . Hyperlipidemia   . Hypertension   . Diabetes   . Nephrolithiasis   . Ankylosing spondylitis   . CAD (coronary artery disease)     a. stenting of LCx 2013; b. STEMI 06/12/14 s/p PCI to LAD complicated by post cath shock requiring IABP; VT s/p DCCV, EF 20%; c. NSTEMI 06/26/14 treated medically.  . Ischemic cardiomyopathy     a. echo 08/23/2014 EF <20%, dilated CM, mod MR/TR  . Acute on chronic respiratory failure     a. 08/2014 in setting of PE.  . Bilateral pulmonary embolism     a. 08/2014 - started on Coumadin. Retrievable IVC filter placed 08/27/14 due to RV strain and large clot burden.  . Right leg DVT     a. 08/2014.  Marland Kitchen Chronic systolic CHF (congestive heart failure)   . Hypotension   . Hemoptysis     a. 08/2014 possibly due to pulm infarct/PE.  Marland Kitchen Pleural effusion on right 08/2014 - small  . Leukocytosis   . Carotid artery disease     a. s/p stenting.  . Ventricular tachycardia     a. 06/2014 at time of MI, s/p DCCV.   Assessment: 68yom on coumadin pta for hx recent bilateral PE and RLE DVT s/p IVC filter (08/2014), being admitted with low output heart failure. He had a RHC today and will be started on milrinone. INR is therapeutic at 2.77. AST/ALT mildly elevated.   Home dose: 4  mg daily  Goal of Therapy:  INR 2-3 Monitor platelets by anticoagulation protocol: Yes   Plan:  1) Coumadin 4mg  x 1 2) Daily INR  Deboraha Sprang 09/10/2014,2:42 PM

## 2014-09-10 NOTE — Care Management Note (Addendum)
Page 1 of 2   11/01/2014     3:11:39 PM CARE MANAGEMENT NOTE 11/01/2014  Patient:  Scott [name], Scott [name]   Account Number:  [id number]  Date Initiated:  09/10/2014  Documentation initiated by:  Elissa Hefty  Subjective/Objective Assessment:   adm w heat failure, lifevest     Action/Plan:   lives w wife, pcp dr Jenny Reichmann walker, has lifevest   Anticipated DC Date:  11/02/2014   Anticipated DC Plan:  [place]  CM consult      PAC Choice  [place]   Choice offered to / List presented to:  C-1 Patient   DME arranged  IV PUMP/EQUIPMENT  BIPAP  3-N-1  [place]      DME agency  [place] arranged  HH-1 RN  HH-10 DISEASE MANAGEMENT  HH-3 OT  HH-2 PT      [place].   Status of service:  Completed, signed off Medicare Important Message given?  YES (If response is "NO", the following Medicare IM given date fields will be blank) Date Medicare IM given:  09/13/2014 Medicare IM given by:  Elissa Hefty Date Additional Medicare IM given:  11/01/2014 Additional Medicare IM given by:  Marvetta Gibbons  Discharge Disposition:  [place]  Per UR Regulation:  Reviewed for med. necessity/level of care/duration of stay  If discussed at [place] of Stay Meetings, dates discussed:   09/16/2014  09/21/2014  09/30/2014  10/05/2014  10/07/2014  10/12/2014  10/21/2014  10/26/2014  10/28/2014    Comments:  11/01/14- 1400- Steffanie Dunn Selby Slovacek RN, BSN [phone] Pt for possible d/c home tomorrow- spoke with pt/wife at bedside- would like 3n1 and rw- orders have been placed- [place] orders have also been updated to include INR checks and wound care- spoke with Jermaine with [facility]- DME to be delivered to room prior to discharge - and have updated Miranda with [facility] regarding [place] needs.  10/25/14- 42- Marvetta Gibbons RN, BSN [phone] Spoke with Brenton Grills with [place] who  is working on BIPAP for home- have spoke with both pt/wife at bedside- Jermaine with [facility] has already spoken with them regarding BIPAP for home- will also have HH-RN/PT/OT with [facility] at discharge- questions answered regarding [place] services.  10/22/14- [place] RN, BSN [phone] Noticed pt has order for BIPAP- need settings call made to Amy-HF-NP- who stated to f/u with resp. for settings- spoke with Lattie Haw -RT- regarding BIPAP settings- pt has been on 12/6 no 02 bleed -RA- BIPAP order modified per verbal given by Amy-NP- will need order cosigned in epic for [facility] to process- have spoken with Jermaine with [facility]- pt will need sleep study arranged for BIPAP- for insurance purposes- can be arranged through [place] and done at home. Pt also needs order for DME- rollator and 3n1- NCM to f/u on DME prior to discharge.   10/20/14- 1200- Marvetta Gibbons RN, BSN [phone] Pt advancing well now- plan remains to return home with spouse- with [facility] services- [facility]   10/18/2014 1730 Additional IM given. Jonnie Finner RN CCM Case Mgmt  10-12-14 4pm Luz Lex, RNBSN [phone] Remains on amio and milrinone - PT/OT recommending HH - DME equip recommended - 2 wheeled w/c with sit down seat, 3-n-1 BSC, and tub/shower seat.  CM will continue to follow.  10-08-14 1:45pm Luz Lex, RNBSN -  [phone] Moved to ICU  4/5 due to low MAP and low hemoglobin. Taken Back to the OR 4/5 and 4/6  for recurrent bleed and evacuation of clots from left chest. Currently on milrinone, epi, levo, nitric at 10, lasix drip, and amio. Remains intubated. Awake. Asking why we didn't let him die  10-01-14 9:15am Luz Lex, RNBSN [phone] Decreased alertness - high PCO2 - tx back to ICU on bipap. may need reintubation.  09/29/14- 1430- Marvetta Gibbons RN, BSN [phone] Pt has progressed to 2W off vent- CT has been d/c'd- pt will need [place] orders for discharge- when medically stable. [place] will be with [facility].  09-22-14 [place] Post op from insertion of Lvad on 3-23.  Remains intubated and on multiple.  3/14 1413 debbie dowell rn,bsn spoke w pt and wife. pt for home milrinone. went over [facility]hc agency list. no pref. ref to debbie t w [facility] for home iv milrinone. pt thinks maybe dc on 3-15.

## 2014-09-10 NOTE — CV Procedure (Addendum)
Cardiac Cath Procedure Note:  Indication:  HF  Procedures performed:  1) Right heart catheterization 2) Gordy Councilman placement  Description of procedure:   The risks and indication of the procedure were explained. Consent was signed and placed on the chart. An appropriate timeout was taken prior to the procedure. The right neck was prepped and draped in the routine sterile fashion and anesthetized with 1% local lidocaine.   A 7 FR venous sheath was placed in the right internal jugular vein using a modified Seldinger technique. A standard Swan-Ganz catheter was used for the procedure. After the RHC was complete the Swan catheter tip was placed in the R PA and the sheath was sewn into place and a dressing was applied.   Complications: None apparent.  Findings:  RA = 16 RV = 61/11/21 PA = 61/25 (35) PCW = 23 Fick cardiac output/index = 3.7/1.8 PVR = 2.5 WU Ao sat = 89% PA sat = 38%, 38%  Assessment: 1. Markedly decompensated hemodynamics with low output  Plan/Discussion:  Start milrinone.  [name] MD 1:56 PM

## 2014-09-11 ENCOUNTER — Encounter (HOSPITAL_COMMUNITY): Payer: Self-pay | Admitting: Internal Medicine

## 2014-09-11 ENCOUNTER — Inpatient Hospital Stay (HOSPITAL_COMMUNITY): Payer: Medicare Other

## 2014-09-11 LAB — BASIC METABOLIC PANEL
Anion gap: 3 — ABNORMAL LOW (ref 5–15)
BUN: 23 mg/dL (ref 6–23)
CO2: 38 mmol/L — ABNORMAL HIGH (ref 19–32)
Calcium: 8.1 mg/dL — ABNORMAL LOW (ref 8.4–10.5)
Chloride: 100 mmol/L (ref 96–112)
Creatinine, Ser: 1.16 mg/dL (ref 0.50–1.35)
GFR calc Af Amer: 74 mL/min — ABNORMAL LOW (ref >90)
GFR calc non Af Amer: 64 mL/min — ABNORMAL LOW (ref >90)
Glucose, Bld: 120 mg/dL — ABNORMAL HIGH (ref 70–99)
Potassium: 3.8 mmol/L (ref 3.5–5.1)
Sodium: 141 mmol/L (ref 135–145)

## 2014-09-11 LAB — CARBOXYHEMOGLOBIN
Carboxyhemoglobin: 1.2 % (ref 0.5–1.5)
Methemoglobin: 0.7 % (ref 0.0–1.5)
O2 Saturation: 66.4 %
Total hemoglobin: 10.2 g/dL — ABNORMAL LOW (ref 13.5–18.0)

## 2014-09-11 LAB — PROTIME-INR
INR: 2.8 — ABNORMAL HIGH (ref 0.00–1.49)
PROTHROMBIN TIME: 29.8 s — AB (ref 11.6–15.2)

## 2014-09-11 MED ORDER — WARFARIN SODIUM 4 MG PO TABS
4.0000 mg | ORAL_TABLET | Freq: Once | ORAL | Status: AC
  Administered 2014-09-11: 4 mg via ORAL
  Filled 2014-09-11: qty 1

## 2014-09-11 MED ORDER — WARFARIN VIDEO
Freq: Once | Status: AC
  Administered 2014-09-11: 17:00:00

## 2014-09-11 MED ORDER — POTASSIUM CHLORIDE ER 10 MEQ PO TBCR
20.0000 meq | EXTENDED_RELEASE_TABLET | Freq: Two times a day (BID) | ORAL | Status: DC
  Administered 2014-09-11 – 2014-09-14 (×7): 20 meq via ORAL
  Filled 2014-09-11 (×10): qty 2

## 2014-09-11 NOTE — Progress Notes (Signed)
Patient's swan ganz waveform is dampened. It is 62 " at the hub, as noted previously. Dr. Lavonna Monarch notified and order for [facility] obtained. Will monitor.

## 2014-09-11 NOTE — Progress Notes (Signed)
Scott [name] cath pulled back to approx the 55 mark on cath per recommendations of Dr. Lavonna Monarch. The PAP waveform is reading appropriately at this time. Follow up [place] ordered. Patient tolerated well.

## 2014-09-11 NOTE — Progress Notes (Addendum)
Advanced Heart Failure Rounding Note   Subjective:     Feels much better. Diuresed well on milrinone. Weight down 8 pounds. Breathing better.   RA = 10 PA = 65/28 (39) PCW = 21 Fick cardiac output/index = 5.8/2.8  Seen by hematology and thrombocytosis felt to be reactive.   Objective:   Weight Range:  Vital Signs:   Temp:  [96.8 F (36 C)-98.8 F (37.1 C)] 98.4 F (36.9 C) (03/12 1200) Pulse Rate:  [43-89] 88 (03/12 1200) Resp:  [10-28] 24 (03/12 1200) BP: (83-113)/(49-74) 88/57 mmHg (03/12 1200) SpO2:  [91 %-100 %] 93 % (03/12 1200) Weight:  [87.5 kg (192 lb 14.4 oz)] 87.5 kg (192 lb 14.4 oz) (03/12 0453) Last BM Date: 09/10/14  Weight change: Filed Weights   09/10/14 1130 09/10/14 1131 09/11/14 0453  Weight: 90.5 kg (199 lb 8.3 oz) 90.9 kg (200 lb 6.4 oz) 87.5 kg (192 lb 14.4 oz)    Intake/Output:   Intake/Output Summary (Last 24 hours) at 09/11/14 1316 Last data filed at 09/11/14 1200  Gross per 24 hour  Intake 1382.8 ml  Output   4025 ml  Net -2642.2 ml     Physical Exam: General: sitting in bed No resp difficulty HEENT: normal Neck: supple. RIJ swan. Carotids 2+ bilaterally; no bruits. No lymphadenopathy or thryomegaly appreciated. Cor: PMI laterally displaced. Regular rate & rhythm. +s3 Lungs: clear Abdomen: soft, nontender, nondistended. No hepatosplenomegaly. No bruits or masses. Good bowel sounds. Extremities: no cyanosis, clubbing, rash, R and LLE 2+ edema Neuro: alert & orientedx3, cranial nerves grossly intact. Moves all 4 extremities w/o difficulty. Affect pleasant.  Telemetry: SR  Labs: Basic Metabolic Panel:  Recent Labs Lab 09/10/14 1000 09/11/14 0450  NA 142 141  K 5.5* 3.8  CL 103 100  CO2 30 38*  GLUCOSE 127* 120*  BUN 28* 23  CREATININE 1.35 1.16  CALCIUM 8.6 8.1*  MG 2.5  --     Liver Function Tests:  Recent Labs Lab 09/10/14 1000  AST 83*  ALT 118*  ALKPHOS 322*  BILITOT 0.8  PROT 7.3  ALBUMIN 2.8*   No  results for input(s): LIPASE, AMYLASE in the last 168 hours. No results for input(s): AMMONIA in the last 168 hours.  CBC:  Recent Labs Lab 09/10/14 1000  WBC 13.2*  HGB 10.7*  HCT 35.1*  MCV 92.6  PLT 1013*    Cardiac Enzymes: No results for input(s): CKTOTAL, CKMB, CKMBINDEX, TROPONINI in the last 168 hours.  BNP: BNP (last 3 results)  Recent Labs  07/20/14 1053 08/27/14 0253 09/10/14 1000  BNP 773.6* 1971.6* 2173.4*    ProBNP (last 3 results) No results for input(s): PROBNP in the last 8760 hours.    Other results:   Imaging:  No results found.   Medications:     Scheduled Medications: . furosemide  80 mg Intravenous BID  . sodium chloride  3 mL Intravenous Q12H  . Warfarin - Pharmacist Dosing Inpatient   Does not apply q1800     Infusions: . milrinone 0.25 mcg/kg/min (09/11/14 0800)     PRN Medications:  sodium chloride, acetaminophen, ondansetron (ZOFRAN) IV, sodium chloride   Assessment:   1. A/C systolic HF with cardiogenic shock- EF 20% noted 06/2014. I 2. CAD s/p Anterior STEMI on 06/12/14 with stenting of LAD- on coumadin + plavix. On statin and bb.  3. 06/12/14 VT in setting of STEMI 4. Ankylosing spondylitis. 5. Bilateral PE with R DVT- S/P IVC filter 08/27/2014--->coumadin + plavix  6. Asystole noted from Life Vest 7. LV thrombus 8. Full Code   Plan/Discussion:     Much improved with milrinone. Volume status better but still elevated, Continue IV lasix. Will need home inotropes. Await EP decision on ICD. Continue coumadin for PE and LV thrombus. Will need VAD evaluation. I will ask VAD team to see him Monday and have him meet VAD patient. Place PICC in am. Place TED hose.   I measured swan numbers and cardiac outputs personally with nurse at bedside.    The patient is critically ill with multiple organ systems failure and requires high complexity decision making for assessment and support, frequent evaluation and  titration of therapies, application of advanced monitoring technologies and extensive interpretation of multiple databases.   Critical Care Time devoted to patient care services described in this note is 35 Minutes.    Han Trenee Igoe,MD 1:34 PM      Length of Stay: 1   Shunsuke Melaine Mcphee 09/11/2014, 1:16 PM  Advanced Heart Failure Team Pager [phone] (M-F; 7a - 4p)  Please contact [facility] for night-coverage after hours (4p -7a ) and weekends on [email or web address]

## 2014-09-11 NOTE — Progress Notes (Signed)
ANTICOAGULATION CONSULT NOTE - Follow-Up Consult  Pharmacy Consult for Coumadin Indication: hx bilateral PE and RLE DVT  No Known Allergies  Patient Measurements: Height: 5\' 8"  (172.7 cm) Weight: 192 lb 14.4 oz (87.5 kg) IBW/kg (Calculated) : 68.4   Vital Signs: Temp: 98.4 F (36.9 C) (03/12 1200) Temp Source: Core (Comment) (03/12 1200) BP: 88/57 mmHg (03/12 1200) Pulse Rate: 88 (03/12 1200)  Labs:  Recent Labs  09/10/14 1000 09/11/14 0450  HGB 10.7*  --   HCT 35.1*  --   PLT 1013*  --   LABPROT 29.5* 29.8*  INR 2.77* 2.80*  CREATININE 1.35 1.16    Estimated Creatinine Clearance: 67.3 mL/min (by C-G formula based on Cr of 1.16).   Medical History: Past Medical History  Diagnosis Date  . Hyperlipidemia   . Hypertension   . Diabetes   . Nephrolithiasis   . Ankylosing spondylitis   . CAD (coronary artery disease)     a. stenting of LCx 2013; b. STEMI 06/12/14 s/p PCI to LAD complicated by post cath shock requiring IABP; VT s/p DCCV, EF 20%; c. NSTEMI 06/26/14 treated medically.  . Ischemic cardiomyopathy     a. echo 08/23/2014 EF <20%, dilated CM, mod MR/TR  . Acute on chronic respiratory failure     a. 08/2014 in setting of PE.  . Bilateral pulmonary embolism     a. 08/2014 - started on Coumadin. Retrievable IVC filter placed 08/27/14 due to RV strain and large clot burden.  . Right leg DVT     a. 08/2014.  Marland Kitchen Chronic systolic CHF (congestive heart failure)   . Hypotension   . Hemoptysis     a. 08/2014 possibly due to pulm infarct/PE.  Marland Kitchen Pleural effusion on right 08/2014 - small  . Leukocytosis   . Carotid artery disease     a. s/p stenting.  . Ventricular tachycardia     a. 06/2014 at time of MI, s/p DCCV.  Marland Kitchen Reactive thrombocytosis 09/10/2014  . Leukocytosis 09/10/2014   Assessment: 69yo male on coumadin PTA for hx recent bilateral PE and RLE DVT s/p IVC filter (08/2014), admitted 09/10/2014 with low output heart failure. He had a RHC 3/11 and was started on  milrinone. INR therapeutic at 2.77 on admit, remains therapeutic today.  Home dose: 2mg  Mon and Fri, 4mg  all other days (per recent anticoag note)  Goal of Therapy:  INR 2-3 Monitor platelets by anticoagulation protocol: Yes   Plan:  - Coumadin 4mg  x 1 - Daily INR, Q72h CBC, s/sx of bleeding  Drucie Opitz, PharmD Clinical Pharmacy Resident Pager: [phone] 09/11/2014 1:27 PM

## 2014-09-12 ENCOUNTER — Inpatient Hospital Stay (HOSPITAL_COMMUNITY): Payer: Medicare Other

## 2014-09-12 DIAGNOSIS — R001 Bradycardia, unspecified: Secondary | ICD-10-CM

## 2014-09-12 LAB — BASIC METABOLIC PANEL
Anion gap: 5 (ref 5–15)
BUN: 16 mg/dL (ref 6–23)
CO2: 36 mmol/L — ABNORMAL HIGH (ref 19–32)
Calcium: 7.5 mg/dL — ABNORMAL LOW (ref 8.4–10.5)
Chloride: 99 mmol/L (ref 96–112)
Creatinine, Ser: 1.17 mg/dL (ref 0.50–1.35)
GFR calc Af Amer: 73 mL/min — ABNORMAL LOW (ref >90)
GFR calc non Af Amer: 63 mL/min — ABNORMAL LOW (ref >90)
Glucose, Bld: 107 mg/dL — ABNORMAL HIGH (ref 70–99)
Potassium: 3.5 mmol/L (ref 3.5–5.1)
Sodium: 140 mmol/L (ref 135–145)

## 2014-09-12 LAB — CARBOXYHEMOGLOBIN
Carboxyhemoglobin: 1.3 % (ref 0.5–1.5)
Methemoglobin: 0.8 % (ref 0.0–1.5)
O2 Saturation: 79.3 %
Total hemoglobin: 8.7 g/dL — ABNORMAL LOW (ref 13.5–18.0)

## 2014-09-12 LAB — PROTIME-INR
INR: 2.44 — ABNORMAL HIGH (ref 0.00–1.49)
Prothrombin Time: 26.7 seconds — ABNORMAL HIGH (ref 11.6–15.2)

## 2014-09-12 MED ORDER — ROSUVASTATIN CALCIUM 10 MG PO TABS
10.0000 mg | ORAL_TABLET | Freq: Every day | ORAL | Status: DC
  Administered 2014-09-12 – 2014-10-04 (×21): 10 mg via ORAL
  Filled 2014-09-12 (×24): qty 1

## 2014-09-12 MED ORDER — WARFARIN SODIUM 4 MG PO TABS
4.0000 mg | ORAL_TABLET | Freq: Once | ORAL | Status: AC
  Administered 2014-09-12: 4 mg via ORAL
  Filled 2014-09-12 (×2): qty 1

## 2014-09-12 MED ORDER — TORSEMIDE 20 MG PO TABS
40.0000 mg | ORAL_TABLET | Freq: Every day | ORAL | Status: DC
Start: 2014-09-12 — End: 2014-09-13
  Administered 2014-09-12: 40 mg via ORAL
  Filled 2014-09-12 (×2): qty 2

## 2014-09-12 MED ORDER — POTASSIUM CHLORIDE CRYS ER 20 MEQ PO TBCR
40.0000 meq | EXTENDED_RELEASE_TABLET | Freq: Once | ORAL | Status: AC
  Administered 2014-09-12: 40 meq via ORAL
  Filled 2014-09-12: qty 2

## 2014-09-12 MED ORDER — SODIUM CHLORIDE 0.9 % IJ SOLN
10.0000 mL | Freq: Two times a day (BID) | INTRAMUSCULAR | Status: DC
  Administered 2014-09-12 – 2014-09-14 (×4): 10 mL
  Administered 2014-09-15: 30 mL
  Administered 2014-09-16 – 2014-09-17 (×3): 10 mL
  Administered 2014-09-17: 20 mL
  Administered 2014-09-18 – 2014-09-23 (×7): 10 mL

## 2014-09-12 MED ORDER — SODIUM CHLORIDE 0.9 % IJ SOLN: 10.0000 mL | INTRAMUSCULAR | Status: DC | PRN

## 2014-09-12 MED ORDER — CLOPIDOGREL BISULFATE 75 MG PO TABS
75.0000 mg | ORAL_TABLET | Freq: Every day | ORAL | Status: DC
  Administered 2014-09-12 – 2014-09-15 (×4): 75 mg via ORAL
  Filled 2014-09-12 (×5): qty 1

## 2014-09-12 NOTE — Progress Notes (Signed)
ANTICOAGULATION CONSULT NOTE - Follow-Up Consult  Pharmacy Consult for Coumadin Indication: hx bilateral PE and RLE DVT  No Known Allergies  Patient Measurements: Height: 5\' 8"  (172.7 cm) Weight: 186 lb 8.2 oz (84.6 kg) IBW/kg (Calculated) : 68.4   Vital Signs: Temp: 98.2 F (36.8 C) (03/13 1200) Temp Source: Oral (03/13 1200) BP: 80/55 mmHg (03/13 1500) Pulse Rate: 90 (03/13 1500)  Labs:  Recent Labs  09/10/14 1000 09/11/14 0450 09/12/14 0430  HGB 10.7*  --   --   HCT 35.1*  --   --   PLT 1013*  --   --   LABPROT 29.5* 29.8* 26.7*  INR 2.77* 2.80* 2.44*  CREATININE 1.35 1.16 1.17    Estimated Creatinine Clearance: 65.8 mL/min (by C-G formula based on Cr of 1.17).   Medical History: Past Medical History  Diagnosis Date  . Hyperlipidemia   . Hypertension   . Diabetes   . Nephrolithiasis   . Ankylosing spondylitis   . CAD (coronary artery disease)     a. stenting of LCx 2013; b. STEMI 06/12/14 s/p PCI to LAD complicated by post cath shock requiring IABP; VT s/p DCCV, EF 20%; c. NSTEMI 06/26/14 treated medically.  . Ischemic cardiomyopathy     a. echo 08/23/2014 EF <20%, dilated CM, mod MR/TR  . Acute on chronic respiratory failure     a. 08/2014 in setting of PE.  . Bilateral pulmonary embolism     a. 08/2014 - started on Coumadin. Retrievable IVC filter placed 08/27/14 due to RV strain and large clot burden.  . Right leg DVT     a. 08/2014.  Marland Kitchen Chronic systolic CHF (congestive heart failure)   . Hypotension   . Hemoptysis     a. 08/2014 possibly due to pulm infarct/PE.  Marland Kitchen Pleural effusion on right 08/2014 - small  . Leukocytosis   . Carotid artery disease     a. s/p stenting.  . Ventricular tachycardia     a. 06/2014 at time of MI, s/p DCCV.  Marland Kitchen Reactive thrombocytosis 09/10/2014  . Leukocytosis 09/10/2014   Assessment: 69yo male on coumadin PTA for hx recent bilateral PE and RLE DVT s/p IVC filter (08/2014), admitted 09/10/2014 with low output heart failure.  He had a RHC 3/11 and was started on milrinone. INR therapeutic at 2.77 on admit, remains therapeutic today.  Home dose: 2mg  Mon and Fri, 4mg  all other days (per recent anticoag note)  Goal of Therapy:  INR 2-3 Monitor platelets by anticoagulation protocol: Yes   Plan:  - Coumadin 4mg  x 1 - Daily INR, Q72h CBC, s/sx of bleeding  Drucie Opitz, PharmD Clinical Pharmacy Resident Pager: [phone] 09/12/2014 3:48 PM

## 2014-09-12 NOTE — Progress Notes (Addendum)
Patient ID: Scott [name], male   DOB: 1945-10-30, 69 y.o.   MRN: [id number] Advanced Heart Failure Rounding Note   Subjective:     Feels much better. Diuresed well on milrinone. Weight down another 6 lbs. Breathing better.   RA = 3 PA = 49/21 PCWP not able to wedge Cardiac index 3.14  Seen by hematology and thrombocytosis felt to be reactive.   Objective:   Weight Range:  Vital Signs:   Temp:  [97.7 F (36.5 C)-99.5 F (37.5 C)] 97.9 F (36.6 C) (03/13 0700) Pulse Rate:  [79-94] 82 (03/13 0700) Resp:  [8-32] 26 (03/13 0700) BP: (80-102)/(49-74) 91/62 mmHg (03/13 0700) SpO2:  [91 %-99 %] 95 % (03/13 0700) Weight:  [186 lb 8.2 oz (84.6 kg)] 186 lb 8.2 oz (84.6 kg) (03/13 0410) Last BM Date: 09/11/14  Weight change: Filed Weights   09/10/14 1131 09/11/14 0453 09/12/14 0410  Weight: 200 lb 6.4 oz (90.9 kg) 192 lb 14.4 oz (87.5 kg) 186 lb 8.2 oz (84.6 kg)    Intake/Output:   Intake/Output Summary (Last 24 hours) at 09/12/14 0802 Last data filed at 09/12/14 0700  Gross per 24 hour  Intake 1606.4 ml  Output   3675 ml  Net -2068.6 ml     Physical Exam: General: sitting in bed No resp difficulty HEENT: normal Neck: supple. RIJ swan. No JVD.  Carotids 2+ bilaterally; no bruits. No lymphadenopathy or thryomegaly appreciated. Cor: PMI laterally displaced. Regular rate & rhythm. +s3 Lungs: clear Abdomen: soft, nontender, nondistended. No hepatosplenomegaly. No bruits or masses. Good bowel sounds. Extremities: no cyanosis, clubbing, rash, R and LLE 2+ edema Neuro: alert & orientedx3, cranial nerves grossly intact. Moves all 4 extremities w/o difficulty. Affect pleasant.  Telemetry: SR  Labs: Basic Metabolic Panel:  Recent Labs Lab 09/10/14 1000 09/11/14 0450 09/12/14 0430  NA 142 141 140  K 5.5* 3.8 3.5  CL 103 100 99  CO2 30 38* 36*  GLUCOSE 127* 120* 107*  BUN 28* 23 16  CREATININE 1.35 1.16 1.17  CALCIUM 8.6 8.1* 7.5*  MG 2.5  --   --      Liver Function Tests:  Recent Labs Lab 09/10/14 1000  AST 83*  ALT 118*  ALKPHOS 322*  BILITOT 0.8  PROT 7.3  ALBUMIN 2.8*   No results for input(s): LIPASE, AMYLASE in the last 168 hours. No results for input(s): AMMONIA in the last 168 hours.  CBC:  Recent Labs Lab 09/10/14 1000  WBC 13.2*  HGB 10.7*  HCT 35.1*  MCV 92.6  PLT 1013*    Cardiac Enzymes: No results for input(s): CKTOTAL, CKMB, CKMBINDEX, TROPONINI in the last 168 hours.  BNP: BNP (last 3 results)  Recent Labs  07/20/14 1053 08/27/14 0253 09/10/14 1000  BNP 773.6* 1971.6* 2173.4*    ProBNP (last 3 results) No results for input(s): PROBNP in the last 8760 hours.    Other results:   Imaging: Dg Chest Port 1 View  09/12/2014   CLINICAL DATA:  Central line placement.  EXAM: PORTABLE CHEST - 1 VIEW  COMPARISON:  09/11/2014 at 2236 hour  FINDINGS: The right internal jugular Swan-Ganz catheter has been retracted, tip in the region of the right pulmonary outflow tract. Right lower lobe airspace disease and pleural effusion, not significantly changed from prior exam. Cardiomegaly is unchanged. No pulmonary edema.  IMPRESSION: 1. Tip of the right Swan-Ganz catheter has been retracted, now in the region of the proximal right pulmonary outflow tract. 2. Unchanged  right basilar airspace disease and cardiomegaly.   Electronically Signed   By: Jeb Levering M.D.   On: 09/12/2014 01:57   Dg Chest Port 1 View  09/11/2014   CLINICAL DATA:  Swan-Ganz catheter placement.  Initial encounter.  EXAM: PORTABLE CHEST - 1 VIEW  COMPARISON:  Chest radiograph performed 08/31/2014  FINDINGS: The patient's right IJ Swan-Ganz catheter is seen ending relatively distally overlying the right mid lung.  Right basilar airspace opacity is somewhat better defined than on the prior study, and may reflect pneumonia superimposed on the patient's previously noted pulmonary infarct. A small right pleural effusion is noted. The left  lung is relatively clear. No pneumothorax is seen.  The cardiomediastinal silhouette is enlarged. An external pacing pad is seen on the left. No acute osseous abnormalities are seen.  IMPRESSION: 1. Right IJ Swan-Ganz catheter noted ending relatively distally overlying the right mid lung. Would correlate as to whether this could be retracted slightly, as deemed clinically appropriate. 2. Right basilar airspace opacity is somewhat better defined than on the prior study, and may reflect pneumonia superimposed on the previously noted pulmonary infarct. Small right pleural effusion seen. 3. Cardiomegaly again noted.   Electronically Signed   By: Garald Balding M.D.   On: 09/11/2014 23:02     Medications:     Scheduled Medications: . clopidogrel  75 mg Oral Daily  . potassium chloride  20 mEq Oral BID  . rosuvastatin  10 mg Oral q1800  . sodium chloride  3 mL Intravenous Q12H  . torsemide  40 mg Oral Daily  . Warfarin - Pharmacist Dosing Inpatient   Does not apply q1800    Infusions: . milrinone 0.25 mcg/kg/min (09/11/14 2000)    PRN Medications: sodium chloride, acetaminophen, ondansetron (ZOFRAN) IV, sodium chloride   Assessment:   1. A/C systolic HF with cardiogenic shock- EF 15% with moderate RV dysfunction 3/16 echo.  2. CAD s/p Anterior STEMI on 06/12/14 with stenting of LAD- on coumadin + plavix, statin.   3. 06/12/14 VT in setting of STEMI 4. Ankylosing spondylitis. 5. Bilateral PE with R DVT- S/P IVC filter 08/27/2014--->coumadin + plavix  6. Asystole noted from Life Vest 7. LV thrombus 8. Full Code   Plan/Discussion:    Much improved with milrinone. CVP 3 today and PA pressure down.  Transition to torsemide 40 mg daily. Will need home inotropes, can remove Swan and place PICC today.  Await EP decision on ICD => probably Lifevest until LVAD. Continue coumadin for PE and LV thrombus, INR therpeutic. Will need VAD evaluation => need resolution of LV thrombus.  VAD team to  see Monday and will have him meet VAD patient.    I measured swan numbers and cardiac outputs personally with nurse at bedside.    The patient is critically ill with multiple organ systems failure and requires high complexity decision making for assessment and support, frequent evaluation and titration of therapies, application of advanced monitoring technologies and extensive interpretation of multiple databases.   Critical Care Time devoted to patient care services described in this note is 35 Minutes.  Marcellina Jonsson,MD 8:02 AM  09/12/2014  Advanced Heart Failure Team Pager [phone] (M-F; 7a - 4p)  Please contact [facility] for night-coverage after hours (4p -7a ) and weekends on [email or web address]

## 2014-09-12 NOTE — Progress Notes (Signed)
Peripherally Inserted Central Catheter/Midline Placement  The IV Nurse has discussed with the patient and/or persons authorized to consent for the patient, the purpose of this procedure and the potential benefits and risks involved with this procedure.  The benefits include less needle sticks, lab draws from the catheter and patient may be discharged home with the catheter.  Risks include, but not limited to, infection, bleeding, blood clot (thrombus formation), and puncture of an artery; nerve damage and irregular heat beat.  Alternatives to this procedure were also discussed.  PICC/Midline Placement Documentation  PICC / Midline Double Lumen 57/84/69 PICC Right Basilic 41 cm 0 cm (Active)  Indication for Insertion or Continuance of Line Home intravenous therapies (PICC only) 09/12/2014  1:39 PM  Exposed Catheter (cm) 0 cm 09/12/2014  1:39 PM  Lumen #1 Status Flushed;Saline locked;Blood return noted 09/12/2014  1:39 PM  Lumen #2 Status Flushed;Saline locked;Blood return noted 09/12/2014  1:39 PM  Dressing Change Due 09/19/14 09/12/2014  1:39 PM       Gordan Payment 09/12/2014, 1:40 PM

## 2014-09-12 NOTE — Consult Note (Signed)
Reason for Consult: To consider ICD implantation  Referring Physician: Dr. Gerri Lins is an 69 y.o. male.   HPI: The patient is 69 yo man with a h/o known CAD, s/p MI 2013 who presented with an Anterolateral MI in 12/15. His PCI/stenting was complicated by shock/IABP/VT but he eventually improved and was discharged with a life vest and medical therapy. He has not done well with class 3B/4 CHF symptoms. He has not had a discharge from his life vest. He was admitted with worsening heart failure symptoms and has undergone right heart catheterization and is now going to be started on Milrinone and then LVAD. He has not had sustained VT or any therapy from his Life vest.  PMH: Past Medical History  Diagnosis Date  . Hyperlipidemia   . Hypertension   . Diabetes   . Nephrolithiasis   . Ankylosing spondylitis   . CAD (coronary artery disease)     a. stenting of LCx 2013; b. STEMI 06/12/14 s/p PCI to LAD complicated by post cath shock requiring IABP; VT s/p DCCV, EF 20%; c. NSTEMI 06/26/14 treated medically.  . Ischemic cardiomyopathy     a. echo 08/23/2014 EF <20%, dilated CM, mod MR/TR  . Acute on chronic respiratory failure     a. 08/2014 in setting of PE.  . Bilateral pulmonary embolism     a. 08/2014 - started on Coumadin. Retrievable IVC filter placed 08/27/14 due to RV strain and large clot burden.  . Right leg DVT     a. 08/2014.  Marland Kitchen Chronic systolic CHF (congestive heart failure)   . Hypotension   . Hemoptysis     a. 08/2014 possibly due to pulm infarct/PE.  Marland Kitchen Pleural effusion on right 08/2014 - small  . Leukocytosis   . Carotid artery disease     a. s/p stenting.  . Ventricular tachycardia     a. 06/2014 at time of MI, s/p DCCV.  Marland Kitchen Reactive thrombocytosis 09/10/2014  . Leukocytosis 09/10/2014    PSHX: Past Surgical History  Procedure Laterality Date  . Left heart catheterization with coronary angiogram N/A 06/12/2014    Procedure: LEFT HEART CATHETERIZATION WITH  CORONARY ANGIOGRAM;  Surgeon: Lorretta Harp, MD;  Location: [facility] CATH LAB;  Service: Cardiovascular;  Laterality: N/A;  . Right heart catheterization N/A 09/10/2014    Procedure: RIGHT HEART CATH;  Surgeon: [name], MD;  Location: [facility] CATH LAB;  Service: Cardiovascular;  Laterality: N/A;    FAMHX: Family History  Problem Relation Age of Onset  . Hypertension Mother     Social History:  reports that he quit smoking about 41 years ago. He does not have any smokeless tobacco history on file. He reports that he does not drink alcohol or use illicit drugs.  Allergies: No Known Allergies  Medications: reviewed  Dg Chest Port 1 View  09/12/2014   CLINICAL DATA:  Central line placement.  EXAM: PORTABLE CHEST - 1 VIEW  COMPARISON:  09/11/2014 at 2236 hour  FINDINGS: The right internal jugular Swan-Ganz catheter has been retracted, tip in the region of the right pulmonary outflow tract. Right lower lobe airspace disease and pleural effusion, not significantly changed from prior exam. Cardiomegaly is unchanged. No pulmonary edema.  IMPRESSION: 1. Tip of the right Swan-Ganz catheter has been retracted, now in the region of the proximal right pulmonary outflow tract. 2. Unchanged right basilar airspace disease and cardiomegaly.   Electronically Signed   By: Fonnie Birkenhead.D.  On: 09/12/2014 01:57   Dg Chest Port 1 View  09/11/2014   CLINICAL DATA:  Swan-Ganz catheter placement.  Initial encounter.  EXAM: PORTABLE CHEST - 1 VIEW  COMPARISON:  Chest radiograph performed 08/31/2014  FINDINGS: The patient's right IJ Swan-Ganz catheter is seen ending relatively distally overlying the right mid lung.  Right basilar airspace opacity is somewhat better defined than on the prior study, and may reflect pneumonia superimposed on the patient's previously noted pulmonary infarct. A small right pleural effusion is noted. The left lung is relatively clear. No pneumothorax is seen.  The cardiomediastinal  silhouette is enlarged. An external pacing pad is seen on the left. No acute osseous abnormalities are seen.  IMPRESSION: 1. Right IJ Swan-Ganz catheter noted ending relatively distally overlying the right mid lung. Would correlate as to whether this could be retracted slightly, as deemed clinically appropriate. 2. Right basilar airspace opacity is somewhat better defined than on the prior study, and may reflect pneumonia superimposed on the previously noted pulmonary infarct. Small right pleural effusion seen. 3. Cardiomegaly again noted.   Electronically Signed   By: Garald Balding M.D.   On: 09/11/2014 23:02    ROS  As stated in the HPI and negative for all other systems.  Physical Exam  Vitals:Blood pressure 99/65, pulse 87, temperature 98.2 F (36.8 C), temperature source Core (Comment), resp. rate 22, height 5\' 8"  (1.727 m), weight 186 lb 8.2 oz (84.6 kg), SpO2 99 %.  Stable appearing 69 yo man, NAD HEENT: Unremarkable Neck:  7 cm JVD, no thyromegally Back:  No CVA tenderness Lungs:  Clear with no wheezes HEART:  Regular rate rhythm, no murmurs, no rubs, no clicks Abd:  Flat, positive bowel sounds, no organomegally, no rebound, no guarding Ext:  2 plus pulses, no edema, no cyanosis, no clubbing Skin:  No rashes no nodules Neuro:  CN II through XII intact, motor grossly intact  Tele - nsr  ECG - NSR with QRS 105 ms  Assessment/Plan: 1. Acute on chronic systolic heart failure, EF 20% 2. S/p anterior MI 3 months ago, s/p revascularization 3. Bradycardia documented while sleeping on life vest 4. Initiation of home ionotropic therapy Rec: At this point if patient to be placed with LVAD, I do not think ICD implant is indicated. He has been compliant with his life vest. Agree that no indication for BiV device.   Carleene Overlie TaylorMD 09/12/2014, 8:39 AM

## 2014-09-13 LAB — BASIC METABOLIC PANEL
Anion gap: 9 (ref 5–15)
BUN: 11 mg/dL (ref 6–23)
CO2: 32 mmol/L (ref 19–32)
Calcium: 8 mg/dL — ABNORMAL LOW (ref 8.4–10.5)
Chloride: 99 mmol/L (ref 96–112)
Creatinine, Ser: 0.97 mg/dL (ref 0.50–1.35)
GFR calc Af Amer: >90 mL/min (ref >90)
GFR calc non Af Amer: 84 mL/min — ABNORMAL LOW (ref >90)
Glucose, Bld: 134 mg/dL — ABNORMAL HIGH (ref 70–99)
Potassium: 3.8 mmol/L (ref 3.5–5.1)
Sodium: 140 mmol/L (ref 135–145)

## 2014-09-13 LAB — CBC
HCT: 35.8 % — ABNORMAL LOW (ref 39.0–52.0)
Hemoglobin: 10.9 g/dL — ABNORMAL LOW (ref 13.0–17.0)
MCH: 27.8 pg (ref 26.0–34.0)
MCHC: 30.4 g/dL (ref 30.0–36.0)
MCV: 91.3 fL (ref 78.0–100.0)
Platelets: 743 10*3/uL — ABNORMAL HIGH (ref 150–400)
RBC: 3.92 MIL/uL — ABNORMAL LOW (ref 4.22–5.81)
RDW: 17.1 % — ABNORMAL HIGH (ref 11.5–15.5)
WBC: 12.3 10*3/uL — ABNORMAL HIGH (ref 4.0–10.5)

## 2014-09-13 LAB — PROTIME-INR
INR: 2 — ABNORMAL HIGH (ref 0.00–1.49)
Prothrombin Time: 22.8 seconds — ABNORMAL HIGH (ref 11.6–15.2)

## 2014-09-13 LAB — CARBOXYHEMOGLOBIN
CARBOXYHEMOGLOBIN: 1.4 % (ref 0.5–1.5)
Methemoglobin: 1 % (ref 0.0–1.5)
O2 Saturation: 67.9 %
Total hemoglobin: 11 g/dL — ABNORMAL LOW (ref 13.5–18.0)

## 2014-09-13 LAB — PATHOLOGIST SMEAR REVIEW: PATH REVIEW: INCREASED

## 2014-09-13 MED ORDER — SPIRONOLACTONE 12.5 MG HALF TABLET
12.5000 mg | ORAL_TABLET | Freq: Every day | ORAL | Status: DC
  Administered 2014-09-13 – 2014-09-20 (×8): 12.5 mg via ORAL
  Filled 2014-09-13 (×9): qty 1

## 2014-09-13 MED ORDER — WARFARIN SODIUM 6 MG PO TABS
6.0000 mg | ORAL_TABLET | Freq: Once | ORAL | Status: AC
  Administered 2014-09-13: 6 mg via ORAL
  Filled 2014-09-13: qty 1

## 2014-09-13 MED ORDER — DIGOXIN 125 MCG PO TABS
0.1250 mg | ORAL_TABLET | Freq: Every day | ORAL | Status: DC
  Administered 2014-09-13 – 2014-09-20 (×8): 0.125 mg via ORAL
  Filled 2014-09-13 (×10): qty 1

## 2014-09-13 NOTE — Progress Notes (Signed)
Mechanical Circulatory Support Note  Scott [name] is a 69 y.o. with  has a past medical history of Hyperlipidemia; Hypertension; Diabetes; Nephrolithiasis; Ankylosing spondylitis; CAD (coronary artery disease); Ischemic cardiomyopathy; Acute on chronic respiratory failure; Bilateral pulmonary embolism; Right leg DVT; Chronic systolic CHF (congestive heart failure); Hypotension; Hemoptysis; Pleural effusion on right (08/2014 - small); Leukocytosis; Carotid artery disease; Ventricular tachycardia; Reactive thrombocytosis (09/10/2014); and Leukocytosis (09/10/2014). Currently being evaluated for advanced therapies which include Left Ventricular Assist Device implantation.   Have had lengthy discussion with patient and wife about the risks, benefits, indications and alternatives to Heartmate II VAD placement. I have reviewed in full the following; caregiver role, life-long anticoagulation, complications, life style changes, follow up care, discharge planning, rehabilitation, VAD dressing supplies and driveline management care. The patient was provided with educational resources to review more about the device offered. The patient understands that from this discussion it does not mean that they will receive the device, but that depends on an extensive evaluation process. The patient is aware of the fact that if at anytime they want to stop the evaluation process they can.  He wishes to discuss options regarding survival on device vs. OMM with Dr. Haroldine Laws prior to consenting for evaluation. Will continue to follow up.   I have provided the patient with my contact information in order for them to call back if they have any questions.  Janene Madeira, RN VAD Coordinator   Office: [phone] 24/7 VAD Pager: [phone]

## 2014-09-13 NOTE — Progress Notes (Signed)
Patient ID: Scott [name], male   DOB: January 08, 1946, 69 y.o.   MRN: [id number] Advanced Heart Failure Rounding Note   Subjective:     Yesterday swan was removed and IV lasix was stopped. He transitioned to torsemide. Remains on milrinone @ 0.25 mcg.    Felt better yesterday but today complaining of fatigue. Denies SOB. Weight down 13 pounds. SBPs 90s. Still with occasional low volume hemoptysis  Seen by hematology and thrombocytosis felt to be reactive.   CO-OX 67%.   INR 2.0   Objective:   Weight Range:  Vital Signs:   Temp:  [97.5 F (36.4 C)-98 F (36.7 C)] 97.6 F (36.4 C) (03/14 1149) Pulse Rate:  [56-113] 113 (03/14 1149) Resp:  [19-35] 29 (03/14 1149) BP: (68-101)/(46-73) 91/67 mmHg (03/14 1149) SpO2:  [90 %-99 %] 96 % (03/14 1149) Weight:  [187 lb 9.8 oz (85.1 kg)] 187 lb 9.8 oz (85.1 kg) (03/14 0333) Last BM Date: 09/12/14  Weight change: Filed Weights   09/11/14 0453 09/12/14 0410 09/13/14 0333  Weight: 192 lb 14.4 oz (87.5 kg) 186 lb 8.2 oz (84.6 kg) 187 lb 9.8 oz (85.1 kg)    Intake/Output:   Intake/Output Summary (Last 24 hours) at 09/13/14 1210 Last data filed at 09/13/14 0857  Gross per 24 hour  Intake  838.8 ml  Output    975 ml  Net -136.2 ml     Physical Exam: General: sitting in the chair.  No resp difficulty HEENT: normal Neck: supple. RIJ. JVP ~ 5-6. Marland Kitchen  Carotids 2+ bilaterally; no bruits. No lymphadenopathy or thryomegaly appreciated. Cor: PMI laterally displaced. Tachy regular. +s3 Lungs: clear Abdomen: soft, nontender, nondistended. No hepatosplenomegaly. No bruits or masses. Good bowel sounds. Extremities: no cyanosis, clubbing, rash, no edema.  RUE PICC Neuro: alert & orientedx3, cranial nerves grossly intact. Moves all 4 extremities w/o difficulty. Affect pleasant.  Telemetry: SR  Labs: Basic Metabolic Panel:  Recent Labs Lab 09/10/14 1000 09/11/14 0450 09/12/14 0430 09/13/14 0547  NA 142 141 140 140  K 5.5* 3.8 3.5  3.8  CL 103 100 99 99  CO2 30 38* 36* 32  GLUCOSE 127* 120* 107* 134*  BUN 28* 23 16 11   CREATININE 1.35 1.16 1.17 0.97  CALCIUM 8.6 8.1* 7.5* 8.0*  MG 2.5  --   --   --     Liver Function Tests:  Recent Labs Lab 09/10/14 1000  AST 83*  ALT 118*  ALKPHOS 322*  BILITOT 0.8  PROT 7.3  ALBUMIN 2.8*   No results for input(s): LIPASE, AMYLASE in the last 168 hours. No results for input(s): AMMONIA in the last 168 hours.  CBC:  Recent Labs Lab 09/10/14 1000 09/13/14 0547  WBC 13.2* 12.3*  HGB 10.7* 10.9*  HCT 35.1* 35.8*  MCV 92.6 91.3  PLT 1013* 743*    Cardiac Enzymes: No results for input(s): CKTOTAL, CKMB, CKMBINDEX, TROPONINI in the last 168 hours.  BNP: BNP (last 3 results)  Recent Labs  07/20/14 1053 08/27/14 0253 09/10/14 1000  BNP 773.6* 1971.6* 2173.4*    ProBNP (last 3 results) No results for input(s): PROBNP in the last 8760 hours.    Other results:   Imaging: Dg Chest Port 1 View  09/12/2014   CLINICAL DATA:  Central line placement.  EXAM: PORTABLE CHEST - 1 VIEW  COMPARISON:  09/11/2014 at 2236 hour  FINDINGS: The right internal jugular Swan-Ganz catheter has been retracted, tip in the region of the right pulmonary outflow tract.  Right lower lobe airspace disease and pleural effusion, not significantly changed from prior exam. Cardiomegaly is unchanged. No pulmonary edema.  IMPRESSION: 1. Tip of the right Swan-Ganz catheter has been retracted, now in the region of the proximal right pulmonary outflow tract. 2. Unchanged right basilar airspace disease and cardiomegaly.   Electronically Signed   By: Jeb Levering M.D.   On: 09/12/2014 01:57   Dg Chest Port 1 View  09/11/2014   CLINICAL DATA:  Swan-Ganz catheter placement.  Initial encounter.  EXAM: PORTABLE CHEST - 1 VIEW  COMPARISON:  Chest radiograph performed 08/31/2014  FINDINGS: The patient's right IJ Swan-Ganz catheter is seen ending relatively distally overlying the right mid lung.   Right basilar airspace opacity is somewhat better defined than on the prior study, and may reflect pneumonia superimposed on the patient's previously noted pulmonary infarct. A small right pleural effusion is noted. The left lung is relatively clear. No pneumothorax is seen.  The cardiomediastinal silhouette is enlarged. An external pacing pad is seen on the left. No acute osseous abnormalities are seen.  IMPRESSION: 1. Right IJ Swan-Ganz catheter noted ending relatively distally overlying the right mid lung. Would correlate as to whether this could be retracted slightly, as deemed clinically appropriate. 2. Right basilar airspace opacity is somewhat better defined than on the prior study, and may reflect pneumonia superimposed on the previously noted pulmonary infarct. Small right pleural effusion seen. 3. Cardiomegaly again noted.   Electronically Signed   By: Garald Balding M.D.   On: 09/11/2014 23:02     Medications:     Scheduled Medications: . clopidogrel  75 mg Oral Daily  . potassium chloride  20 mEq Oral BID  . rosuvastatin  10 mg Oral q1800  . sodium chloride  10-40 mL Intracatheter Q12H  . sodium chloride  3 mL Intravenous Q12H  . torsemide  40 mg Oral Daily  . Warfarin - Pharmacist Dosing Inpatient   Does not apply q1800    Infusions: . milrinone 0.25 mcg/kg/min (09/13/14 0007)    PRN Medications: sodium chloride, acetaminophen, ondansetron (ZOFRAN) IV, sodium chloride, sodium chloride   Assessment:   1. A/C systolic HF with cardiogenic shock- EF 15% with moderate RV dysfunction 3/16 echo.  2. CAD s/p Anterior STEMI on 06/12/14 with stenting of LAD- on coumadin + plavix, statin.   3. 06/12/14 VT in setting of STEMI 4. Ankylosing spondylitis. 5. Bilateral PE with R DVT- S/P IVC filter 08/27/2014--->coumadin + plavix  6. Asystole noted from Life Vest 7. LV thrombus 8. Full Code 9. Hemoptysis   Plan/Discussion:    CO-OX ok but feels worse today. Having increased  fatigue. Continue milrinone @ 0.25 mcg. Continue  torsemide 40 mg daily. PICC in place for home milrinone. No bb or arb or spiro. BP soft. Renal function stable.    Await EP decision on ICD => probably Lifevest until LVAD.   Continue coumadin for PE and LV thrombus, INR therpeutic. Having daily hemoptysis. Hemoglobin stable.    Will need VAD evaluation => need resolution of LV thrombus.  VAD team saw today.      CLEGG,AMY,NP-C  12:10 PM  09/13/2014  Advanced Heart Failure Team Pager [phone] (M-F; [place])  Please contact [facility] for night-coverage after hours (4p -7a ) and weekends on [email or web address]  Patient seen and examined with Darrick Grinder, NP. We discussed all aspects of the encounter. I agree with the assessment and plan as stated above.   Feels worse today despite  milrinone. Co-ox 67%. May be a little dry. Will hold torsemide. Start spiro 12.5 and digoxin. Continue coumadin. Long talk about role of VAD versus home inotropes. Will have him meet VAD patient to help with decision. Will need home milrinone.   Have asked EP to re-evaluate for ICD given severe bradycardia/asystole on admit.   Remove RIJ sheath.   Travis Bensimhon,MD 2:01 PM

## 2014-09-13 NOTE — Progress Notes (Signed)
ANTICOAGULATION CONSULT NOTE - Follow-Up Consult  Pharmacy Consult for Coumadin Indication: hx bilateral PE and RLE DVT  No Known Allergies  Patient Measurements: Height: 5\' 8"  (172.7 cm) Weight: 187 lb 9.8 oz (85.1 kg) IBW/kg (Calculated) : 68.4   Vital Signs: Temp: 97.6 F (36.4 C) (03/14 1149) Temp Source: Oral (03/14 1149) BP: 91/67 mmHg (03/14 1149) Pulse Rate: 113 (03/14 1149)  Labs:  Recent Labs  09/11/14 0450 09/12/14 0430 09/13/14 0547  HGB  --   --  10.9*  HCT  --   --  35.8*  PLT  --   --  743*  LABPROT 29.8* 26.7* 22.8*  INR 2.80* 2.44* 2.00*  CREATININE 1.16 1.17 0.97    Estimated Creatinine Clearance: 79.6 mL/min (by C-G formula based on Cr of 0.97).   Medical History: Past Medical History  Diagnosis Date  . Hyperlipidemia   . Hypertension   . Diabetes   . Nephrolithiasis   . Ankylosing spondylitis   . CAD (coronary artery disease)     a. stenting of LCx 2013; b. STEMI 06/12/14 s/p PCI to LAD complicated by post cath shock requiring IABP; VT s/p DCCV, EF 20%; c. NSTEMI 06/26/14 treated medically.  . Ischemic cardiomyopathy     a. echo 08/23/2014 EF <20%, dilated CM, mod MR/TR  . Acute on chronic respiratory failure     a. 08/2014 in setting of PE.  . Bilateral pulmonary embolism     a. 08/2014 - started on Coumadin. Retrievable IVC filter placed 08/27/14 due to RV strain and large clot burden.  . Right leg DVT     a. 08/2014.  Marland Kitchen Chronic systolic CHF (congestive heart failure)   . Hypotension   . Hemoptysis     a. 08/2014 possibly due to pulm infarct/PE.  Marland Kitchen Pleural effusion on right 08/2014 - small  . Leukocytosis   . Carotid artery disease     a. s/p stenting.  . Ventricular tachycardia     a. 06/2014 at time of MI, s/p DCCV.  Marland Kitchen Reactive thrombocytosis 09/10/2014  . Leukocytosis 09/10/2014   Assessment: 69yo male on coumadin PTA for hx recent bilateral PE and RLE DVT s/p IVC filter (08/2014), admitted 09/10/2014 with low output heart failure.  He had a RHC 3/11 and was started on milrinone. INR therapeutic at 2.77 on admit, but has been dropping since on home dose INR 2.0 today  Home dose: 2mg  Mon and Fri, 4mg  all other days (per recent anticoag note)  Goal of Therapy:  INR 2-3 Monitor platelets by anticoagulation protocol: Yes   Plan:  - Coumadin 6mg  x 1 - Daily INR, Q72h CBC, s/sx of bleeding  Bonnita Nasuti Pharm.D. CPP, BCPS Clinical Pharmacist [phone] 09/13/2014 2:03 PM

## 2014-09-14 DIAGNOSIS — I213 ST elevation (STEMI) myocardial infarction of unspecified site: Secondary | ICD-10-CM

## 2014-09-14 LAB — CARBOXYHEMOGLOBIN
Carboxyhemoglobin: 1.1 % (ref 0.5–1.5)
Carboxyhemoglobin: 1.2 % (ref 0.5–1.5)
METHEMOGLOBIN: 0.9 % (ref 0.0–1.5)
Methemoglobin: 0.8 % (ref 0.0–1.5)
O2 Saturation: 51.9 %
O2 Saturation: 57.9 %
TOTAL HEMOGLOBIN: 11.6 g/dL — AB (ref 13.5–18.0)
Total hemoglobin: 12.2 g/dL — ABNORMAL LOW (ref 13.5–18.0)

## 2014-09-14 LAB — BASIC METABOLIC PANEL
Anion gap: 4 — ABNORMAL LOW (ref 5–15)
BUN: 11 mg/dL (ref 6–23)
CO2: 34 mmol/L — ABNORMAL HIGH (ref 19–32)
Calcium: 8.4 mg/dL (ref 8.4–10.5)
Chloride: 101 mmol/L (ref 96–112)
Creatinine, Ser: 0.95 mg/dL (ref 0.50–1.35)
GFR calc non Af Amer: 85 mL/min — ABNORMAL LOW (ref >90)
Glucose, Bld: 139 mg/dL — ABNORMAL HIGH (ref 70–99)
POTASSIUM: 4.4 mmol/L (ref 3.5–5.1)
Sodium: 139 mmol/L (ref 135–145)

## 2014-09-14 LAB — HEPARIN LEVEL (UNFRACTIONATED)
Heparin Unfractionated: 0.11 IU/mL — ABNORMAL LOW (ref 0.30–0.70)
Heparin Unfractionated: 0.5 IU/mL (ref 0.30–0.70)

## 2014-09-14 LAB — PROTIME-INR
INR: 1.72 — ABNORMAL HIGH (ref 0.00–1.49)
Prothrombin Time: 20.3 seconds — ABNORMAL HIGH (ref 11.6–15.2)

## 2014-09-14 MED ORDER — WARFARIN SODIUM 6 MG PO TABS
6.0000 mg | ORAL_TABLET | Freq: Once | ORAL | Status: AC
  Administered 2014-09-14: 6 mg via ORAL
  Filled 2014-09-14: qty 1

## 2014-09-14 MED ORDER — HEPARIN (PORCINE) IN NACL 100-0.45 UNIT/ML-% IJ SOLN
1400.0000 [IU]/h | INTRAMUSCULAR | Status: DC
  Administered 2014-09-14: 1400 [IU]/h via INTRAVENOUS
  Filled 2014-09-14 (×2): qty 250

## 2014-09-14 MED ORDER — HEPARIN (PORCINE) IN NACL 100-0.45 UNIT/ML-% IJ SOLN
1400.0000 [IU]/h | INTRAMUSCULAR | Status: DC
  Administered 2014-09-15: 1600 [IU]/h via INTRAVENOUS
  Administered 2014-09-16: 1400 [IU]/h via INTRAVENOUS
  Filled 2014-09-14 (×7): qty 250

## 2014-09-14 NOTE — Progress Notes (Signed)
ANTICOAGULATION CONSULT NOTE - Follow-Up Consult  Pharmacy Consult for heparin Indication: h/o bilateral PE and RLE DVT  No Known Allergies  Patient Measurements: Height: 5\' 8"  (172.7 cm) Weight: 185 lb 6.5 oz (84.1 kg) IBW/kg (Calculated) : 68.4  Vital Signs: Temp: 97.8 F (36.6 C) (03/15 1900) Temp Source: Oral (03/15 1900) BP: 104/84 mmHg (03/15 2000) Pulse Rate: 110 (03/15 2000)  Labs:  Recent Labs  09/12/14 0430 09/13/14 0547 09/14/14 0410 09/14/14 1330 09/14/14 2014  HGB  --  10.9*  --   --   --   HCT  --  35.8*  --   --   --   PLT  --  743*  --   --   --   LABPROT 26.7* 22.8* 20.3*  --   --   INR 2.44* 2.00* 1.72*  --   --   HEPARINUNFRC  --   --   --  0.11* 0.50  CREATININE 1.17 0.97 0.95  --   --     Estimated Creatinine Clearance: 80.8 mL/min (by C-G formula based on Cr of 0.95).   Medical History: Past Medical History  Diagnosis Date  . Hyperlipidemia   . Hypertension   . Diabetes   . Nephrolithiasis   . Ankylosing spondylitis   . CAD (coronary artery disease)     a. stenting of LCx 2013; b. STEMI 06/12/14 s/p PCI to LAD complicated by post cath shock requiring IABP; VT s/p DCCV, EF 20%; c. NSTEMI 06/26/14 treated medically.  . Ischemic cardiomyopathy     a. echo 08/23/2014 EF <20%, dilated CM, mod MR/TR  . Acute on chronic respiratory failure     a. 08/2014 in setting of PE.  . Bilateral pulmonary embolism     a. 08/2014 - started on Coumadin. Retrievable IVC filter placed 08/27/14 due to RV strain and large clot burden.  . Right leg DVT     a. 08/2014.  Marland Kitchen Chronic systolic CHF (congestive heart failure)   . Hypotension   . Hemoptysis     a. 08/2014 possibly due to pulm infarct/PE.  Marland Kitchen Pleural effusion on right 08/2014 - small  . Leukocytosis   . Carotid artery disease     a. s/p stenting.  . Ventricular tachycardia     a. 06/2014 at time of MI, s/p DCCV.  Marland Kitchen Reactive thrombocytosis 09/10/2014  . Leukocytosis 09/10/2014    Assessment: 69yo male  on warfarin PTA for h/o recent bilateral PE and RLE DVT, s/p IVC filter (08/2014), admitted 09/10/2014 with low output heart failure.  He had a RHC 3/11 and was started on milrinone.  INR therapeutic at 2.77 on admit, but has continuously decreased, INR subtherapeutic today at 1.72.  To bridge with heparin until INR therapeutic. CBC low but stable, PLT high, no bleeding reported.    Repeat heparin level this PM is therapeutic at 0.5 on 1600 units/hr.   Goal of Therapy:  INR 2-3 Heparin level 0.3-0.7 units/ml Monitor platelets by anticoagulation protocol: Yes   Plan:  - Continue Heparin gtt 1600 units/hr  - Daily HL, INR, CBC, s/sx of bleeding  Sloan Leiter, PharmD, BCPS Clinical Pharmacist [phone] 09/14/2014, 9:21 PM

## 2014-09-14 NOTE — Progress Notes (Signed)
cvp 13; unable to document in EPIC flow sheet.   IT aware.

## 2014-09-14 NOTE — Progress Notes (Signed)
Pts.  SBP was in the low 80's for several hours and pt. Slightly sweaty but denies any discomfort, short of breath or chest pain. Desats. <90's placed on O2 at 2l with sats.  95-97%. Dr. Aundra Dubin made aware of pts. Vitals signs will continue to monitor.

## 2014-09-14 NOTE — Progress Notes (Addendum)
ANTICOAGULATION CONSULT NOTE - Follow-Up Consult  Pharmacy Consult for heparin + warfarin Indication: h/o bilateral PE and RLE DVT  No Known Allergies  Patient Measurements: Height: 5\' 8"  (172.7 cm) Weight: 185 lb 6.5 oz (84.1 kg) IBW/kg (Calculated) : 68.4  Vital Signs: Temp: 98.1 F (36.7 C) (03/15 0300) Temp Source: Oral (03/15 0300) BP: 92/70 mmHg (03/15 0700) Pulse Rate: 111 (03/15 0700)  Labs:  Recent Labs  09/12/14 0430 09/13/14 0547 09/14/14 0410  HGB  --  10.9*  --   HCT  --  35.8*  --   PLT  --  743*  --   LABPROT 26.7* 22.8* 20.3*  INR 2.44* 2.00* 1.72*  CREATININE 1.17 0.97 0.95    Estimated Creatinine Clearance: 80.8 mL/min (by C-G formula based on Cr of 0.95).   Medical History: Past Medical History  Diagnosis Date  . Hyperlipidemia   . Hypertension   . Diabetes   . Nephrolithiasis   . Ankylosing spondylitis   . CAD (coronary artery disease)     a. stenting of LCx 2013; b. STEMI 06/12/14 s/p PCI to LAD complicated by post cath shock requiring IABP; VT s/p DCCV, EF 20%; c. NSTEMI 06/26/14 treated medically.  . Ischemic cardiomyopathy     a. echo 08/23/2014 EF <20%, dilated CM, mod MR/TR  . Acute on chronic respiratory failure     a. 08/2014 in setting of PE.  . Bilateral pulmonary embolism     a. 08/2014 - started on Coumadin. Retrievable IVC filter placed 08/27/14 due to RV strain and large clot burden.  . Right leg DVT     a. 08/2014.  Marland Kitchen Chronic systolic CHF (congestive heart failure)   . Hypotension   . Hemoptysis     a. 08/2014 possibly due to pulm infarct/PE.  Marland Kitchen Pleural effusion on right 08/2014 - small  . Leukocytosis   . Carotid artery disease     a. s/p stenting.  . Ventricular tachycardia     a. 06/2014 at time of MI, s/p DCCV.  Marland Kitchen Reactive thrombocytosis 09/10/2014  . Leukocytosis 09/10/2014    Assessment: 69yo male on warfarin PTA for h/o recent bilateral PE and RLE DVT, s/p IVC filter (08/2014), admitted 09/10/2014 with low output  heart failure.  He had a RHC 3/11 and was started on milrinone.  INR therapeutic at 2.77 on admit, but has continuously decreased, INR subtherapeutic today at 1.72.  To bridge with heparin until INR therapeutic. CBC low but stable, PLT high, no bleeding reported.  Note, may need to stop clopidogrel soon if LVAD to be considered in near future.  PTA warfarin: 2mg  Mon and Fri, 4mg  all other days  Goal of Therapy:  INR 2-3 Heparin level 0.3-0.7 units/ml Monitor platelets by anticoagulation protocol: Yes   Plan:  - Heparin gtt 1400 units/hr (no bolus) - Warfarin 6mg  x1 - 6h heparin level - Discontinue heparin gtt when INR > 2 - Daily HL, INR, CBC, s/sx of bleeding  Drucie Opitz, PharmD Clinical Pharmacy Resident Pager: [phone] 09/14/2014 9:33 AM    Addendum: Initial heparin level low at 0.11, will increase to heparin 1600 units/hr.  Recheck in 6 hrs.  Uvaldo Rising, BCPS  Clinical Pharmacist Pager [phone]  09/14/2014 2:55 PM

## 2014-09-14 NOTE — Progress Notes (Signed)
Patient ID: Scott [name], male   DOB: 09-25-1945, 69 y.o.   MRN: [id number] Advanced Heart Failure Rounding Note   Subjective:   Remains on milrinone @ 0.25 mcg.  Weight down 2 pounds.   Last night BP was low transiently and sats down. Feeling better. Denies SOB.   CO-OX 51%> repeat   INR 1.7   Objective:   Weight Range:  Vital Signs:   Temp:  [97.6 F (36.4 C)-98.2 F (36.8 C)] 98.1 F (36.7 C) (03/15 0300) Pulse Rate:  [99-124] 111 (03/15 0700) Resp:  [19-33] 28 (03/15 0700) BP: (83-108)/(45-80) 92/70 mmHg (03/15 0700) SpO2:  [92 %-100 %] 100 % (03/15 0700) Weight:  [185 lb 6.5 oz (84.1 kg)] 185 lb 6.5 oz (84.1 kg) (03/15 0300) Last BM Date: 09/12/14  Weight change: Filed Weights   09/12/14 0410 09/13/14 0333 09/14/14 0300  Weight: 186 lb 8.2 oz (84.6 kg) 187 lb 9.8 oz (85.1 kg) 185 lb 6.5 oz (84.1 kg)    Intake/Output:   Intake/Output Summary (Last 24 hours) at 09/14/14 0918 Last data filed at 09/14/14 0640  Gross per 24 hour  Intake  261.2 ml  Output    500 ml  Net -238.8 ml     Physical Exam: General: sitting in the chair.  No resp difficulty HEENT: normal Neck: supple. RIJ. JVP ~ 5-6. Marland Kitchen  Carotids 2+ bilaterally; no bruits. No lymphadenopathy or thryomegaly appreciated. Cor: PMI laterally displaced. Tachy regular. +s3 Lungs: clear Abdomen: soft, nontender, nondistended. No hepatosplenomegaly. No bruits or masses. Good bowel sounds. Extremities: no cyanosis, clubbing, rash, no edema.  RUE PICC Neuro: alert & orientedx3, cranial nerves grossly intact. Moves all 4 extremities w/o difficulty. Affect pleasant.  Telemetry: ST 115-120   Labs: Basic Metabolic Panel:  Recent Labs Lab 09/10/14 1000 09/11/14 0450 09/12/14 0430 09/13/14 0547 09/14/14 0410  NA 142 141 140 140 139  K 5.5* 3.8 3.5 3.8 4.4  CL 103 100 99 99 101  CO2 30 38* 36* 32 34*  GLUCOSE 127* 120* 107* 134* 139*  BUN 28* 23 16 11 11   CREATININE 1.35 1.16 1.17 0.97 0.95  CALCIUM  8.6 8.1* 7.5* 8.0* 8.4  MG 2.5  --   --   --   --     Liver Function Tests:  Recent Labs Lab 09/10/14 1000  AST 83*  ALT 118*  ALKPHOS 322*  BILITOT 0.8  PROT 7.3  ALBUMIN 2.8*   No results for input(s): LIPASE, AMYLASE in the last 168 hours. No results for input(s): AMMONIA in the last 168 hours.  CBC:  Recent Labs Lab 09/10/14 1000 09/13/14 0547  WBC 13.2* 12.3*  HGB 10.7* 10.9*  HCT 35.1* 35.8*  MCV 92.6 91.3  PLT 1013* 743*    Cardiac Enzymes: No results for input(s): CKTOTAL, CKMB, CKMBINDEX, TROPONINI in the last 168 hours.  BNP: BNP (last 3 results)  Recent Labs  07/20/14 1053 08/27/14 0253 09/10/14 1000  BNP 773.6* 1971.6* 2173.4*    ProBNP (last 3 results) No results for input(s): PROBNP in the last 8760 hours.    Other results:   Imaging: No results found.   Medications:     Scheduled Medications: . clopidogrel  75 mg Oral Daily  . digoxin  0.125 mg Oral Daily  . potassium chloride  20 mEq Oral BID  . rosuvastatin  10 mg Oral q1800  . sodium chloride  10-40 mL Intracatheter Q12H  . sodium chloride  3 mL Intravenous Q12H  .  spironolactone  12.5 mg Oral Daily  . Warfarin - Pharmacist Dosing Inpatient   Does not apply q1800    Infusions: . milrinone 0.25 mcg/kg/min (09/14/14 0640)    PRN Medications: sodium chloride, acetaminophen, ondansetron (ZOFRAN) IV, sodium chloride, sodium chloride   Assessment:   1. A/C systolic HF with cardiogenic shock- EF 15% with moderate RV dysfunction 3/16 echo.  2. CAD s/p Anterior STEMI on 06/12/14 with stenting of LAD- on coumadin + plavix, statin.   3. 06/12/14 VT in setting of STEMI 4. Ankylosing spondylitis. 5. Bilateral PE with R DVT- S/P IVC filter 08/27/2014--->coumadin + plavix  6. Asystole noted from Life Vest 7. LV thrombus 8. Full Code 9. Hemoptysis   Plan/Discussion:    CO-OX down to 51%.   Repeat now.  Weight down 2 pounds. On spiro. May need to stop due to low BP.     PICC in place for home milrinone. No bb or arb or spiro. BP soft. Renal function stable.    Await EP decision on ICD => probably Lifevest until LVAD.   Continue coumadin for PE and LV thrombus, INR 1.7. Starting heparin. Plan to check CBC tomorrow.   Will need VAD evaluation => need resolution of LV thrombus.  VAD team saw today.      CLEGG,AMY,NP-C  9:18 AM  09/14/2014 Advanced Heart Failure Team Pager [phone] (M-F; [place])  Please contact [facility] for night-coverage after hours (4p -7a ) and weekends on [email or web address]  Patient seen and examined with Darrick Grinder, NP. We discussed all aspects of the encounter. I agree with the assessment and plan as stated above.   He remains very tenuous despite milrinone. BP low last night. Very tachycardic and co-ox dropping. He says he is leaning against a VAD but would like to go home and think about it first when he is more stable. I told him that there is a chance that milrinone might not be sufficient to stabilize him and may have to make decisions regarding VAD while he is still in house.  Repeat co-ox today. Check CVPs. Hold torsemide. BP too soft for ACE or b-block. Start heparin for INR < 2.0 in setting of recent PE and LV clot. May need to stop Plavix soon if VAD will be a consideration in very near future.   The patient is critically ill with multiple organ systems failure and requires high complexity decision making for assessment and support, frequent evaluation and titration of therapies, application of advanced monitoring technologies and extensive interpretation of multiple databases.   Critical Care Time personally devoted to patient care services described in this note is 35 Minutes.  Laakea Surina Storts,MD 9:49 AM

## 2014-09-15 LAB — RAPID URINE DRUG SCREEN, HOSP PERFORMED
AMPHETAMINES: NOT DETECTED
BARBITURATES: NOT DETECTED
BENZODIAZEPINES: NOT DETECTED
Cocaine: NOT DETECTED
Opiates: NOT DETECTED
TETRAHYDROCANNABINOL: NOT DETECTED

## 2014-09-15 LAB — LACTATE DEHYDROGENASE: LDH: 213 U/L (ref 94–250)

## 2014-09-15 LAB — URINALYSIS, ROUTINE W REFLEX MICROSCOPIC
BILIRUBIN URINE: NEGATIVE
Glucose, UA: NEGATIVE mg/dL
Ketones, ur: NEGATIVE mg/dL
Nitrite: NEGATIVE
PROTEIN: NEGATIVE mg/dL
Specific Gravity, Urine: 1.017 (ref 1.005–1.030)
UROBILINOGEN UA: 1 mg/dL (ref 0.0–1.0)
pH: 6 (ref 5.0–8.0)

## 2014-09-15 LAB — CBC
HCT: 35.8 % — ABNORMAL LOW (ref 39.0–52.0)
HEMOGLOBIN: 11 g/dL — AB (ref 13.0–17.0)
MCH: 28.1 pg (ref 26.0–34.0)
MCHC: 30.7 g/dL (ref 30.0–36.0)
MCV: 91.6 fL (ref 78.0–100.0)
Platelets: 642 10*3/uL — ABNORMAL HIGH (ref 150–400)
RBC: 3.91 MIL/uL — ABNORMAL LOW (ref 4.22–5.81)
RDW: 17 % — ABNORMAL HIGH (ref 11.5–15.5)
WBC: 12.1 10*3/uL — ABNORMAL HIGH (ref 4.0–10.5)

## 2014-09-15 LAB — BASIC METABOLIC PANEL
ANION GAP: 8 (ref 5–15)
BUN: 11 mg/dL (ref 6–23)
CHLORIDE: 99 mmol/L (ref 96–112)
CO2: 28 mmol/L (ref 19–32)
Calcium: 8.6 mg/dL (ref 8.4–10.5)
Creatinine, Ser: 0.93 mg/dL (ref 0.50–1.35)
GFR calc non Af Amer: 86 mL/min — ABNORMAL LOW (ref >90)
Glucose, Bld: 256 mg/dL — ABNORMAL HIGH (ref 70–99)
Potassium: 4.4 mmol/L (ref 3.5–5.1)
SODIUM: 135 mmol/L (ref 135–145)

## 2014-09-15 LAB — URINE MICROSCOPIC-ADD ON

## 2014-09-15 LAB — HEPARIN LEVEL (UNFRACTIONATED): Heparin Unfractionated: 0.41 IU/mL (ref 0.30–0.70)

## 2014-09-15 LAB — PROTIME-INR
INR: 1.9 — ABNORMAL HIGH (ref 0.00–1.49)
Prothrombin Time: 22 seconds — ABNORMAL HIGH (ref 11.6–15.2)

## 2014-09-15 LAB — CARBOXYHEMOGLOBIN
Carboxyhemoglobin: 1.5 % (ref 0.5–1.5)
Methemoglobin: 1 % (ref 0.0–1.5)
O2 Saturation: 70.7 %
Total hemoglobin: 10.6 g/dL — ABNORMAL LOW (ref 13.5–18.0)

## 2014-09-15 LAB — URIC ACID: URIC ACID, SERUM: 4 mg/dL (ref 4.0–7.8)

## 2014-09-15 LAB — ANTITHROMBIN III: ANTITHROMB III FUNC: 54 % — AB (ref 75–120)

## 2014-09-15 MED ORDER — TRAMADOL HCL 50 MG PO TABS
100.0000 mg | ORAL_TABLET | Freq: Four times a day (QID) | ORAL | Status: DC | PRN
  Administered 2014-09-15 – 2014-10-05 (×8): 100 mg via ORAL
  Filled 2014-09-15 (×8): qty 2

## 2014-09-15 MED ORDER — WARFARIN SODIUM 4 MG PO TABS
4.0000 mg | ORAL_TABLET | Freq: Once | ORAL | Status: AC
  Administered 2014-09-15: 4 mg via ORAL
  Filled 2014-09-15: qty 1

## 2014-09-15 MED ORDER — FUROSEMIDE 40 MG PO TABS
40.0000 mg | ORAL_TABLET | Freq: Every day | ORAL | Status: DC
Start: 2014-09-15 — End: 2014-09-17
  Administered 2014-09-15 – 2014-09-16 (×2): 40 mg via ORAL
  Filled 2014-09-15 (×3): qty 1

## 2014-09-15 NOTE — Progress Notes (Signed)
VAD Team Note:   Received a call from patient's wife requesting a call-back due to some questions pertaining to the VAD evaluation process and the role of the caregiver.   She was concerned that they do not have a strong second caregiver that it may be an exclusion factor for the therapy. Informed her that although two caregivers would be the ideal, she is clearly a devoted caregiver to Mr. Vana and is able to fulfil the role and all it entails and would not be an exclusion.   Patient and his wife provided written consent for LVAD evaluation, Caregiver agreement, PIH release and photo release. Chart review performed and tests/procedures have been ordered to proceed with evaluation. Will continue to follow-up with the patient and his wife while he is inpatient to complete evaluation.

## 2014-09-15 NOTE — Progress Notes (Signed)
Patient ID: Scott [name], male   DOB: 01/05/1946, 69 y.o.   MRN: [id number] Advanced Heart Failure Rounding Note   Subjective:    Remains on milrinone @ 0.25 mcg.  Weight up 2 pounds overnight. Co-ox 71%. INR increasing 1.9. Remains on heparin.  Feels worse today. More dyspneic. Complaining of rib pain. CVP 12  Has agreed to VAD work-up    Objective:   Weight Range:  Vital Signs:   Temp:  [97.1 F (36.2 C)-97.8 F (36.6 C)] 97.6 F (36.4 C) (03/16 1138) Pulse Rate:  [69-113] 103 (03/16 1300) Resp:  [21-34] 33 (03/16 1300) BP: (81-104)/(49-84) 90/61 mmHg (03/16 1300) SpO2:  [95 %-100 %] 97 % (03/16 1300) Weight:  [84.913 kg (187 lb 3.2 oz)] 84.913 kg (187 lb 3.2 oz) (03/16 0300) Last BM Date: 09/14/14  Weight change: Filed Weights   09/13/14 0333 09/14/14 0300 09/15/14 0300  Weight: 85.1 kg (187 lb 9.8 oz) 84.1 kg (185 lb 6.5 oz) 84.913 kg (187 lb 3.2 oz)    Intake/Output:   Intake/Output Summary (Last 24 hours) at 09/15/14 1437 Last data filed at 09/15/14 1300  Gross per 24 hour  Intake 1621.63 ml  Output    150 ml  Net 1471.63 ml     Physical Exam: General: lying in bed  No resp difficulty HEENT: normal Neck: supple. RIJ. JVP ~ 12. .  Carotids 2+ bilaterally; no bruits. No lymphadenopathy or thryomegaly appreciated. Cor: PMI laterally displaced. Tachy regular. +s3 Lungs: clear Abdomen: soft, nontender, nondistended. No hepatosplenomegaly. No bruits or masses. Good bowel sounds. Extremities: no cyanosis, clubbing, rash, no edema.  RUE PICC Neuro: alert & orientedx3, cranial nerves grossly intact. Moves all 4 extremities w/o difficulty. Affect pleasant.  Telemetry: ST 115-120   Labs: Basic Metabolic Panel:  Recent Labs Lab 09/10/14 1000 09/11/14 0450 09/12/14 0430 09/13/14 0547 09/14/14 0410 09/15/14 1000  NA 142 141 140 140 139 135  K 5.5* 3.8 3.5 3.8 4.4 4.4  CL 103 100 99 99 101 99  CO2 30 38* 36* 32 34* 28  GLUCOSE 127* 120* 107* 134*  139* 256*  BUN 28* 23 16 11 11 11   CREATININE 1.35 1.16 1.17 0.97 0.95 0.93  CALCIUM 8.6 8.1* 7.5* 8.0* 8.4 8.6  MG 2.5  --   --   --   --   --     Liver Function Tests:  Recent Labs Lab 09/10/14 1000  AST 83*  ALT 118*  ALKPHOS 322*  BILITOT 0.8  PROT 7.3  ALBUMIN 2.8*   No results for input(s): LIPASE, AMYLASE in the last 168 hours. No results for input(s): AMMONIA in the last 168 hours.  CBC:  Recent Labs Lab 09/10/14 1000 09/13/14 0547 09/15/14 1000  WBC 13.2* 12.3* 12.1*  HGB 10.7* 10.9* 11.0*  HCT 35.1* 35.8* 35.8*  MCV 92.6 91.3 91.6  PLT 1013* 743* 642*    Cardiac Enzymes: No results for input(s): CKTOTAL, CKMB, CKMBINDEX, TROPONINI in the last 168 hours.  BNP: BNP (last 3 results)  Recent Labs  07/20/14 1053 08/27/14 0253 09/10/14 1000  BNP 773.6* 1971.6* 2173.4*    ProBNP (last 3 results) No results for input(s): PROBNP in the last 8760 hours.    Other results:   Imaging: No results found.   Medications:     Scheduled Medications: . clopidogrel  75 mg Oral Daily  . digoxin  0.125 mg Oral Daily  . rosuvastatin  10 mg Oral q1800  . sodium chloride  10-40  mL Intracatheter Q12H  . sodium chloride  3 mL Intravenous Q12H  . spironolactone  12.5 mg Oral Daily  . Warfarin - Pharmacist Dosing Inpatient   Does not apply q1800    Infusions: . heparin 1,600 Units/hr (09/15/14 0000)  . milrinone 0.25 mcg/kg/min (09/15/14 1252)    PRN Medications: sodium chloride, acetaminophen, ondansetron (ZOFRAN) IV, sodium chloride, sodium chloride   Assessment:   1. A/C systolic HF with cardiogenic shock- EF 15% with moderate RV dysfunction 3/16 echo.  2. CAD s/p Anterior STEMI on 06/12/14 with stenting of LAD- on coumadin + plavix, statin.   3. 06/12/14 VT in setting of STEMI 4. Ankylosing spondylitis. 5. Bilateral PE with R DVT- S/P IVC filter 08/27/2014--->coumadin + plavix  6. Asystole noted from Life Vest 7. LV thrombus 8. Full  Code 9. Hemoptysis  Plan/Discussion:    He remains very tenuous despite milrinone. Co-ox ok but he feels poorly and is tachycardic and tachypneic. Will restart lasix.  I think he will need VAD sooner rather than later. He has agreed to proceed with work-up. Dr, [name] to see tomorrow. May need VAD this admission. If that is decision will need to stop warfarin and Plavix.   Rib pain likely from ankylosing spondylitis. Start ultram.   Cardiac rehab to see.   Scott Shantara Goosby,MD  2:37 PM  09/15/2014 Advanced Heart Failure Team Pager [phone] (M-F; 7a - 4p)  Please contact [facility] for night-coverage after hours (4p -7a ) and weekends on [email or web address]

## 2014-09-15 NOTE — Progress Notes (Signed)
ANTICOAGULATION CONSULT NOTE - Follow-Up Consult  Pharmacy Consult for heparin Indication: h/o bilateral PE and RLE DVT  No Known Allergies  Patient Measurements: Height: 5\' 8"  (172.7 cm) Weight: 187 lb 3.2 oz (84.913 kg) IBW/kg (Calculated) : 68.4  Vital Signs: Temp: 97.6 F (36.4 C) (03/16 1138) Temp Source: Oral (03/16 1138) BP: 90/61 mmHg (03/16 1300) Pulse Rate: 103 (03/16 1300)  Labs:  Recent Labs  09/13/14 0547 09/14/14 0410 09/14/14 1330 09/14/14 2014 09/15/14 1000 09/15/14 1124  HGB 10.9*  --   --   --  11.0*  --   HCT 35.8*  --   --   --  35.8*  --   PLT 743*  --   --   --  642*  --   LABPROT 22.8* 20.3*  --   --   --  22.0*  INR 2.00* 1.72*  --   --   --  1.90*  HEPARINUNFRC  --   --  0.11* 0.50  --  0.41  CREATININE 0.97 0.95  --   --  0.93  --     Estimated Creatinine Clearance: 82.9 mL/min (by C-G formula based on Cr of 0.93).   Medical History: Past Medical History  Diagnosis Date  . Hyperlipidemia   . Hypertension   . Diabetes   . Nephrolithiasis   . Ankylosing spondylitis   . CAD (coronary artery disease)     a. stenting of LCx 2013; b. STEMI 06/12/14 s/p PCI to LAD complicated by post cath shock requiring IABP; VT s/p DCCV, EF 20%; c. NSTEMI 06/26/14 treated medically.  . Ischemic cardiomyopathy     a. echo 08/23/2014 EF <20%, dilated CM, mod MR/TR  . Acute on chronic respiratory failure     a. 08/2014 in setting of PE.  . Bilateral pulmonary embolism     a. 08/2014 - started on Coumadin. Retrievable IVC filter placed 08/27/14 due to RV strain and large clot burden.  . Right leg DVT     a. 08/2014.  Marland Kitchen Chronic systolic CHF (congestive heart failure)   . Hypotension   . Hemoptysis     a. 08/2014 possibly due to pulm infarct/PE.  Marland Kitchen Pleural effusion on right 08/2014 - small  . Leukocytosis   . Carotid artery disease     a. s/p stenting.  . Ventricular tachycardia     a. 06/2014 at time of MI, s/p DCCV.  Marland Kitchen Reactive thrombocytosis 09/10/2014   . Leukocytosis 09/10/2014    Assessment: 69yo male on warfarin PTA for h/o recent bilateral PE and RLE DVT, s/p IVC filter (08/2014), admitted 09/10/2014 with low output heart failure with volume overload.  Pharmacy consulted to dose heparin and warfarin.  PMH: CAD s/p LAD stent in 2013, DM, carotid stenting, ankylosing spondylitis, bilateral PE  Anticoagulation:  recent PE (08/2014), INR 2.77 on admit > 1.7 now subtherapeutic, has IVC filter, low volume hemoptysis; heparin level at goal, INR trending up Smear review 3/11: H&H 10.7/35.1, PLT 1013 (thrombocytosis reactive) On warfarin, plavix, heparin gtt PTA dose 2mg  Mon and Fri, 4mg  all other days  Goal of Therapy:  INR 2-3 Heparin level 0.3-0.7 units/ml Monitor platelets by anticoagulation protocol: Yes   Plan:  - Continue Heparin gtt 1600 units/hr  - coumadin 4mg  x 1 - daily HL, INR, CBC, s/sx of bleeding  Thank you for allowing pharmacy to be a part of this patients care team.  Rowe Robert Pharm.D., BCPS, AQ-Cardiology Clinical Pharmacist 09/15/2014 2:01 PM Pager: [phone]  Phone: [phone]

## 2014-09-16 ENCOUNTER — Inpatient Hospital Stay (HOSPITAL_COMMUNITY): Payer: Medicare Other

## 2014-09-16 DIAGNOSIS — Z515 Encounter for palliative care: Secondary | ICD-10-CM

## 2014-09-16 DIAGNOSIS — J90 Pleural effusion, not elsewhere classified: Secondary | ICD-10-CM

## 2014-09-16 DIAGNOSIS — Z452 Encounter for adjustment and management of vascular access device: Secondary | ICD-10-CM

## 2014-09-16 DIAGNOSIS — R531 Weakness: Secondary | ICD-10-CM

## 2014-09-16 DIAGNOSIS — I5023 Acute on chronic systolic (congestive) heart failure: Secondary | ICD-10-CM

## 2014-09-16 LAB — CARBOXYHEMOGLOBIN
CARBOXYHEMOGLOBIN: 1.4 % (ref 0.5–1.5)
CARBOXYHEMOGLOBIN: 1 % (ref 0.5–1.5)
CARBOXYHEMOGLOBIN: 1.2 % (ref 0.5–1.5)
METHEMOGLOBIN: 0.9 % (ref 0.0–1.5)
METHEMOGLOBIN: 0.9 % (ref 0.0–1.5)
Methemoglobin: 0.9 % (ref 0.0–1.5)
O2 SAT: 39.2 %
O2 SAT: 48.1 %
O2 Saturation: 71.5 %
Total hemoglobin: 10.9 g/dL — ABNORMAL LOW (ref 13.5–18.0)
Total hemoglobin: 10.8 g/dL — ABNORMAL LOW (ref 13.5–18.0)
Total hemoglobin: 10 g/dL — ABNORMAL LOW (ref 13.5–18.0)

## 2014-09-16 LAB — PROTEIN, BODY FLUID

## 2014-09-16 LAB — COMPREHENSIVE METABOLIC PANEL
ALT: 62 U/L — AB (ref 0–53)
AST: 46 U/L — ABNORMAL HIGH (ref 0–37)
Albumin: 3 g/dL — ABNORMAL LOW (ref 3.5–5.2)
Alkaline Phosphatase: 226 U/L — ABNORMAL HIGH (ref 39–117)
Anion gap: 8 (ref 5–15)
BILIRUBIN TOTAL: 1.2 mg/dL (ref 0.3–1.2)
BUN: 18 mg/dL (ref 6–23)
CO2: 28 mmol/L (ref 19–32)
CREATININE: 1.17 mg/dL (ref 0.50–1.35)
Calcium: 8.3 mg/dL — ABNORMAL LOW (ref 8.4–10.5)
Chloride: 97 mmol/L (ref 96–112)
GFR, EST AFRICAN AMERICAN: 73 mL/min — AB (ref >90)
GFR, EST NON AFRICAN AMERICAN: 63 mL/min — AB (ref >90)
GLUCOSE: 150 mg/dL — AB (ref 70–99)
Potassium: 4.3 mmol/L (ref 3.5–5.1)
Sodium: 133 mmol/L — ABNORMAL LOW (ref 135–145)
Total Protein: 7.7 g/dL (ref 6.0–8.3)

## 2014-09-16 LAB — BODY FLUID CELL COUNT WITH DIFFERENTIAL
Eos, Fluid: 1 %
LYMPHS FL: 36 %
Monocyte-Macrophage-Serous Fluid: 10 % — ABNORMAL LOW (ref 50–90)
Neutrophil Count, Fluid: 53 % — ABNORMAL HIGH (ref 0–25)
Total Nucleated Cell Count, Fluid: 1260 cu mm — ABNORMAL HIGH (ref 0–1000)

## 2014-09-16 LAB — HEPARIN LEVEL (UNFRACTIONATED)
Heparin Unfractionated: 0.88 IU/mL — ABNORMAL HIGH (ref 0.30–0.70)
Heparin Unfractionated: 0.54 IU/mL (ref 0.30–0.70)

## 2014-09-16 LAB — LACTATE DEHYDROGENASE: LDH: 286 U/L — AB (ref 94–250)

## 2014-09-16 LAB — LIPID PANEL
CHOL/HDL RATIO: 2.6 ratio
Cholesterol: 132 mg/dL (ref 0–200)
HDL: 51 mg/dL (ref >39)
LDL Cholesterol: 72 mg/dL (ref 0–99)
Triglycerides: 45 mg/dL (ref <150)
VLDL: 9 mg/dL (ref 0–40)

## 2014-09-16 LAB — SPIROMETRY WITH GRAPH
FEF 25-75 POST: 1.17 L/s
FEF 25-75 Pre: 1.15 L/sec
FEF2575-%Change-Post: 2 %
FEF2575-%PRED-POST: 46 %
FEF2575-%Pred-Pre: 45 %
FEV1-%Change-Post: 1 %
FEV1-%Pred-Post: 38 %
FEV1-%Pred-Pre: 37 %
FEV1-PRE: 1.22 L
FEV1-Post: 1.24 L
FEV1FVC-%CHANGE-POST: 3 %
FEV1FVC-%Pred-Pre: 105 %
FEV6-%CHANGE-POST: 0 %
FEV6-%PRED-PRE: 36 %
FEV6-%Pred-Post: 36 %
FEV6-PRE: 1.52 L
FEV6-Post: 1.53 L
FEV6FVC-%Pred-Post: 105 %
FEV6FVC-%Pred-Pre: 105 %
FVC-%CHANGE-POST: -2 %
FVC-%Pred-Post: 35 %
FVC-%Pred-Pre: 35 %
FVC-Post: 1.53 L
FVC-Pre: 1.56 L
POST FEV6/FVC RATIO: 100 %
PRE FEV6/FVC RATIO: 100 %
Post FEV1/FVC ratio: 81 %
Pre FEV1/FVC ratio: 78 %

## 2014-09-16 LAB — ALBUMIN, FLUID (OTHER): Albumin, Fluid: 1.3 g/dL

## 2014-09-16 LAB — ABO/RH: ABO/RH(D): A POS

## 2014-09-16 LAB — CBC
HEMATOCRIT: 36.6 % — AB (ref 39.0–52.0)
HEMOGLOBIN: 10.9 g/dL — AB (ref 13.0–17.0)
MCH: 28 pg (ref 26.0–34.0)
MCHC: 29.8 g/dL — ABNORMAL LOW (ref 30.0–36.0)
MCV: 94.1 fL (ref 78.0–100.0)
Platelets: 586 10*3/uL — ABNORMAL HIGH (ref 150–400)
RBC: 3.89 MIL/uL — ABNORMAL LOW (ref 4.22–5.81)
RDW: 17.5 % — AB (ref 11.5–15.5)
WBC: 13.3 10*3/uL — ABNORMAL HIGH (ref 4.0–10.5)

## 2014-09-16 LAB — HEPATITIS B SURFACE ANTIGEN: HEP B S AG: NEGATIVE

## 2014-09-16 LAB — PSA: PSA: 1.26 ng/mL (ref <=4.00)

## 2014-09-16 LAB — TYPE AND SCREEN
ABO/RH(D): A POS
Antibody Screen: NEGATIVE

## 2014-09-16 LAB — BASIC METABOLIC PANEL
Anion gap: 9 (ref 5–15)
BUN: 16 mg/dL (ref 6–23)
CALCIUM: 8.8 mg/dL (ref 8.4–10.5)
CO2: 27 mmol/L (ref 19–32)
CREATININE: 1.3 mg/dL (ref 0.50–1.35)
Chloride: 101 mmol/L (ref 96–112)
GFR calc Af Amer: 64 mL/min — ABNORMAL LOW (ref >90)
GFR, EST NON AFRICAN AMERICAN: 56 mL/min — AB (ref >90)
Glucose, Bld: 157 mg/dL — ABNORMAL HIGH (ref 70–99)
Potassium: 5 mmol/L (ref 3.5–5.1)
Sodium: 137 mmol/L (ref 135–145)

## 2014-09-16 LAB — LACTATE DEHYDROGENASE, PLEURAL OR PERITONEAL FLUID: LD, Fluid: 315 U/L — ABNORMAL HIGH (ref 3–23)

## 2014-09-16 LAB — HEPATITIS C ANTIBODY: HCV Ab: NEGATIVE

## 2014-09-16 LAB — PROTIME-INR
INR: 2.01 — ABNORMAL HIGH (ref 0.00–1.49)
PROTHROMBIN TIME: 22.9 s — AB (ref 11.6–15.2)

## 2014-09-16 LAB — HIV ANTIBODY (ROUTINE TESTING W REFLEX): HIV Screen 4th Generation wRfx: NONREACTIVE

## 2014-09-16 LAB — PREALBUMIN: Prealbumin: 12.5 mg/dL — ABNORMAL LOW (ref 17.0–34.0)

## 2014-09-16 MED ORDER — ADULT MULTIVITAMIN W/MINERALS CH
1.0000 | ORAL_TABLET | Freq: Every day | ORAL | Status: DC
  Administered 2014-09-17 – 2014-09-23 (×6): 1 via ORAL
  Filled 2014-09-16 (×9): qty 1

## 2014-09-16 MED ORDER — DEXTROSE 5 % IV SOLN
2.0000 g | Freq: Three times a day (TID) | INTRAVENOUS | Status: DC
  Administered 2014-09-16 – 2014-09-21 (×15): 2 g via INTRAVENOUS
  Filled 2014-09-16 (×19): qty 2

## 2014-09-16 MED ORDER — SODIUM CHLORIDE 0.9 % IV SOLN: Freq: Once | INTRAVENOUS | Status: DC

## 2014-09-16 MED ORDER — NOREPINEPHRINE BITARTRATE 1 MG/ML IV SOLN
2.0000 ug/min | INTRAVENOUS | Status: DC
  Administered 2014-09-16: 5 ug/min via INTRAVENOUS
  Administered 2014-09-17 – 2014-09-19 (×9): 10 ug/min via INTRAVENOUS
  Filled 2014-09-16 (×10): qty 4

## 2014-09-16 MED ORDER — VANCOMYCIN HCL IN DEXTROSE 1-5 GM/200ML-% IV SOLN
1000.0000 mg | Freq: Two times a day (BID) | INTRAVENOUS | Status: DC
  Administered 2014-09-16 – 2014-09-20 (×10): 1000 mg via INTRAVENOUS
  Filled 2014-09-16 (×14): qty 200

## 2014-09-16 MED ORDER — ALBUTEROL SULFATE (2.5 MG/3ML) 0.083% IN NEBU
2.5000 mg | INHALATION_SOLUTION | Freq: Once | RESPIRATORY_TRACT | Status: AC
  Administered 2014-09-16: 2.5 mg via RESPIRATORY_TRACT

## 2014-09-16 MED ORDER — HEPARIN (PORCINE) IN NACL 100-0.45 UNIT/ML-% IJ SOLN
1800.0000 [IU]/h | INTRAMUSCULAR | Status: DC
  Administered 2014-09-16: 1400 [IU]/h via INTRAVENOUS
  Administered 2014-09-17 – 2014-09-18 (×2): 1500 [IU]/h via INTRAVENOUS
  Administered 2014-09-19: 1800 [IU]/h via INTRAVENOUS
  Administered 2014-09-19: 1700 [IU]/h via INTRAVENOUS
  Filled 2014-09-16 (×9): qty 250

## 2014-09-16 NOTE — Procedures (Signed)
Thoracentesis Procedure Note  Pre-operative Diagnosis: effusion, pleaural, s/p PE  Post-operative Diagnosis: same  Indications: sob, max OR lvad plans  Procedure Details  Consent: Informed consent was obtained. Risks of the procedure were discussed including: infection, bleeding, pain, pneumothorax.  Under sterile conditions the patient was positioned. Betadine solution and sterile drapes were utilized.  1% buffered lidocaine was used to anesthetize the 7th rib space. Fluid was obtained without any difficulties and minimal blood loss.  A dressing was applied to the wound and wound care instructions were provided.   Findings 1350 ml of cloudy pleural dark amber  fluid was obtained. A sample was sent to Pathology for cytogenetics, flow, and cell counts, as well as for infection analysis.  Complications:  None; patient tolerated the procedure well.          Condition: stable  Plan A follow up chest x-ray was ordered. Bed Rest for 1 hours. Tylenol 650 mg. for pain.  Attending Attestation: I performed the procedure.    ffp x 2 prior US After PCXR if neg then heparin  Lavon Paganini. Titus Mould, MD, [place] Pgr: [facility]

## 2014-09-16 NOTE — Progress Notes (Signed)
INITIAL NUTRITION ASSESSMENT  DOCUMENTATION CODES Per approved criteria  -Non-severe (moderate) malnutrition in the context of chronic illness  Pt meets criteria for non-severe moderate malnutrition in the context of chronic illness as evidenced by 11.8% weight loss in 3 months, and mild subcutaneous body fat and muscle mass depletion.  INTERVENTION: Provide Magic Cup BID, each provides 290 kcal, and 9 grams of protein  Provide MVI  NUTRITION DIAGNOSIS: Increased nutrient needs related to LVAD as evidenced by estimated energy needs.   Goal: Pt to meet >/= 90% of estimated energy needs  Monitor:  PO intake, weight trends, labs, I/O's  Reason for Assessment: C/s evaluation for LVAD in setting of advanced heart failure  69 y.o. male  Admitting Dx: Acute on chronic systolic heart failure  ASSESSMENT: 69 y/o with history of CAD with stenting of LCX 2013, DM2, carotid stenting and ankylosing spondylitis. Presented with an anterolateral MI in 12/15 and was discharged with a life vest and medical therapy. Pt was admitted with worsening heart failure symptoms and has undergone right heart catheterization and is now going to be started on Milrinone and then LVAD.   Labs- elevated glucose Wife at bedside, and prepares all meals. Pt confirmed recent weight loss and decreased appetite pta. Per wife, pt has continued to lose weight since MI in December. Wt history shows 11.8 % weight loss in 3 months.  Since MI, wife cut back portion sizes, started reading food labels, and began cooking meals without salt. He has been taking MVI at home. Pt started eating about half of his meals d/t decreased appetite and feeling bloated. Pt's appetite had improved since admission. Current PO intake is >50%.   Because pt's current PO is not 100%, discussed trying [employer]. Wife was concerned that he would gain weight. Explained to wife that pt has increased energy needs at this time, especially with history of  unintentional weight loss. Wife verbalized understanding.  Provide Magic Cup BID and MVI.  Nutrition Focused Physical Exam:  Subcutaneous Fat:  Orbital Region: mild depletion Upper Arm Region: mild depletion Thoracic and Lumbar Region: n/a  Muscle:  Temple Region: mild depletion Clavicle Bone Region: mild depletion Clavicle and Acromion Bone Region: mild depletion Scapular Bone Region: n/a Dorsal Hand: WDL Patellar Region: WDL Anterior Thigh Region: WDL Posterior Calf Region: WDL  Edema: not present    Height: Ht Readings from Last 1 Encounters:  09/10/14 5\' 8"  (1.727 m)    Weight: Wt Readings from Last 1 Encounters:  09/16/14 187 lb (84.823 kg)    Ideal Body Weight: 154 lb (70 kg)  % Ideal Body Weight: 121%  Wt Readings from Last 10 Encounters:  09/16/14 187 lb (84.823 kg)  09/02/14 195 lb 5.2 oz (88.6 kg)  06/17/14 212 lb 4.9 oz (96.3 kg)    Usual Body Weight: 220 lb (before MI 12/15)  % Usual Body Weight: 85%  BMI:  Body mass index is 28.44 kg/(m^2). overweight  Estimated Nutritional Needs: Kcal: 1950-2150 Protein: 105-125 grams Fluid: >/= 1.5L daily  Skin: intact  Diet Order: Diet Heart  EDUCATION NEEDS: -Education needs addressed; Wife thought pt's weight loss was appropriate. Explained how unintentional weight loss over short period of time can be concerning. Also discussed with pt and wife about increased energy needs due to disease state.    Intake/Output Summary (Last 24 hours) at 09/16/14 1339 Last data filed at 09/16/14 0900  Gross per 24 hour  Intake 1369.73 ml  Output    850 ml  Net 519.73 ml    Last BM: 3/17   Labs:   Recent Labs Lab 09/10/14 1000  09/14/14 0410 09/15/14 1000 09/16/14 0359  NA 142  < > 139 135 137  K 5.5*  < > 4.4 4.4 5.0  CL 103  < > 101 99 101  CO2 30  < > 34* 28 27  BUN 28*  < > 11 11 16   CREATININE 1.35  < > 0.95 0.93 1.30  CALCIUM 8.6  < > 8.4 8.6 8.8  MG 2.5  --   --   --   --   GLUCOSE 127*   < > 139* 256* 157*  < > = values in this interval not displayed.  CBG (last 3)  No results for input(s): GLUCAP in the last 72 hours.  Scheduled Meds: . cefTAZidime (FORTAZ)  IV  2 g Intravenous 3 times per day  . digoxin  0.125 mg Oral Daily  . furosemide  40 mg Oral Daily  . rosuvastatin  10 mg Oral q1800  . sodium chloride  10-40 mL Intracatheter Q12H  . sodium chloride  3 mL Intravenous Q12H  . spironolactone  12.5 mg Oral Daily  . vancomycin  1,000 mg Intravenous Q12H    Continuous Infusions: . heparin 1,400 Units/hr (09/16/14 1229)  . milrinone 0.25 mcg/kg/min (09/15/14 1252)    Past Medical History  Diagnosis Date  . Hyperlipidemia   . Hypertension   . Diabetes   . Nephrolithiasis   . Ankylosing spondylitis   . CAD (coronary artery disease)     a. stenting of LCx 2013; b. STEMI 06/12/14 s/p PCI to LAD complicated by post cath shock requiring IABP; VT s/p DCCV, EF 20%; c. NSTEMI 06/26/14 treated medically.  . Ischemic cardiomyopathy     a. echo 08/23/2014 EF <20%, dilated CM, mod MR/TR  . Acute on chronic respiratory failure     a. 08/2014 in setting of PE.  . Bilateral pulmonary embolism     a. 08/2014 - started on Coumadin. Retrievable IVC filter placed 08/27/14 due to RV strain and large clot burden.  . Right leg DVT     a. 08/2014.  Marland Kitchen Chronic systolic CHF (congestive heart failure)   . Hypotension   . Hemoptysis     a. 08/2014 possibly due to pulm infarct/PE.  Marland Kitchen Pleural effusion on right 08/2014 - small  . Leukocytosis   . Carotid artery disease     a. s/p stenting.  . Ventricular tachycardia     a. 06/2014 at time of MI, s/p DCCV.  Marland Kitchen Reactive thrombocytosis 09/10/2014  . Leukocytosis 09/10/2014    Past Surgical History  Procedure Laterality Date  . Left heart catheterization with coronary angiogram N/A 06/12/2014    Procedure: LEFT HEART CATHETERIZATION WITH CORONARY ANGIOGRAM;  Surgeon: Lorretta Harp, MD;  Location[facility] LAB;  Service: Cardiovascular;   Laterality: N/A;  . Right heart catheterization N/A 09/10/2014    Procedure: RIGHT HEART CATH;  Surgeon: [name], MD;  Location: [facility] CATH LAB;  Service: Cardiovascular;  Laterality: N/A;    Wynona Dove, MS Dietetic Intern Pager: [phone]

## 2014-09-16 NOTE — Procedures (Signed)
[place] chest  Right  1. Moderate to large effusion, free flowing, no fibrinous material  Lavon Paganini. Titus Mould, MD, [place] Pgr: [facility]

## 2014-09-16 NOTE — Progress Notes (Signed)
Inpatient Diabetes Program Recommendations  AACE/ADA: New Consensus Statement on Inpatient Glycemic Control (2013)  Target Ranges:  Prepandial:   less than 140 mg/dL      Peak postprandial:   less than 180 mg/dL (1-2 hours)      Critically ill patients:  140 - 180 mg/dL  Results for SHIA, EBER (MRN [id number]) as of 09/16/2014 11:52  Ref. Range 09/13/2014 05:47 09/14/2014 04:10 09/15/2014 10:00 09/15/2014 17:00 09/16/2014 03:59  Glucose Latest Range: 70-99 mg/dL 134 (H) 139 (H) 256 (H)  157 (H)   Consider monitoring CBGs and initiate insulin therapy if necessary. Thank you  Raoul Pitch BSN, RN,CDE Inpatient Diabetes Coordinator [phone] (team pager)

## 2014-09-16 NOTE — Progress Notes (Addendum)
VASCULAR LAB PRELIMINARY  PRELIMINARY  PRELIMINARY  PRELIMINARY  Pre-op Cardiac Surgery  Carotid Findings:  Bilateral:  1-39% ICA stenosis.  Vertebral artery flow is antegrade.      Lower  Extremity Right Left  Brachial  PICC  Triphasic  98  Triphasic   Dorsalis Pedis 127  Triphasic  127  Triphasic   Anterior Tibial    Posterior Tibial 127  Triphasic  131  Triphasic   Ankle/Brachial Indices 1.3 1.34    Lower extremity venous duplex  Right:  DVT noted in a branch of the FV.  There is a patent branch also.  DVT noted in the peroneal vein.  Left:  No evidence of DVT.  Kallee Nam, RVT 09/16/2014, 6:19 PM

## 2014-09-16 NOTE — Progress Notes (Addendum)
Checked on pt in am and social work talking with him. This pm he has had palliative and others. Now with need for thoracentesis and then to start levophed, swan. Will hold for now.  Yves Dill CES, ACSM 2:13 PM 09/16/2014

## 2014-09-16 NOTE — Consult Note (Signed)
Name: Scott [name] MRN: [id number] DOB: 07-27-1945    ADMISSION DATE:  09/10/2014 CONSULTATION DATE:  09/16/2014  REFERRING MD :  Bensimhon  CHIEF COMPLAINT:  SOB  BRIEF PATIENT DESCRIPTION: 69 y.o. M with extensive cardiac hx as well as recent PE / DVT, admitted for SOB due to heart failure, cardiogenic, shock, PE.  Thought to be good candidate for VAD (to be evaluated by VAD team 3/17).  Found to have mod - large R pleural effusion, PCCM consulted for thoracentesis as part of optimization prior to VAD.  SIGNIFICANT EVENTS  3/11 - admit 3/17 - Dr. Everlene Balls to see for VAD, PCCM consulted for right pleural effusion > optimization prior to VAD.  STUDIES:  LE dopplers from OSH: Right leg DVT  CT Chest from OSH: bilateral diffuse PEs w/o overt signs of right heart strain ECHO 12/12 >>EF 20%; mid Lventricle and apex is akinetic ECHO 12/21 >> Echo showed EF <20%, severely decreased global systolic LV function, dilated CM, moderate MR/TR   HISTORY OF PRESENT ILLNESS:  Scott [name] is a 69 y.o. M with PMH as outlined below.  He had recent admission to [facility] (08/26/14 - 09/02/14) for PE / RLE DVT for which he was treated with heparin gtt and coumadin bridge, as well as placement of IVC filter. He presented to HF clinic 3/11 and was doing poorly overall, complained of fatigue, increased LE edema, ongoing DOE.  Admitted by cardiology for further management.  BP remained soft despite milrinone, co-ox dropping.  Felt to be candidate for VAD.  PCCM consulted for mod - large right pleural effusion and possible thoracentesis in order to optimize for VAD.   PAST MEDICAL HISTORY :   has a past medical history of Hyperlipidemia; Hypertension; Diabetes; Nephrolithiasis; Ankylosing spondylitis; CAD (coronary artery disease); Ischemic cardiomyopathy; Acute on chronic respiratory failure; Bilateral pulmonary embolism; Right leg DVT; Chronic systolic CHF (congestive heart failure); Hypotension;  Hemoptysis; Pleural effusion on right (08/2014 - small); Leukocytosis; Carotid artery disease; Ventricular tachycardia; Reactive thrombocytosis (09/10/2014); and Leukocytosis (09/10/2014).  has past surgical history that includes left heart catheterization with coronary angiogram (N/A, 06/12/2014) and right heart catheterization (N/A, 09/10/2014). Prior to Admission medications   Medication Sig Start Date End Date Taking? Authorizing Provider  acetaminophen (TYLENOL) 500 MG tablet Take 1,000 mg by mouth every 6 (six) hours as needed.   Yes Historical Provider, MD  carvedilol (COREG) 6.25 MG tablet Take 0.5 tablets (3.125 mg total) by mouth 2 (two) times daily. 07/20/14  Yes [name], MD  clopidogrel (PLAVIX) 75 MG tablet Take 1 tablet (75 mg total) by mouth daily with breakfast. 07/20/14  Yes [name], MD  digoxin (LANOXIN) 0.125 MG tablet Take 1 tablet (0.125 mg total) by mouth daily. 07/20/14  Yes [name], MD  furosemide (LASIX) 40 MG tablet Take 1 tablet (40mg ) by mouth on Mondays and Fridays. Hold if weight is 196 lbs or less. 09/02/14  Yes Dayna N Dunn, PA-C  ivabradine (CORLANOR) 5 MG TABS tablet Take 1 tablet (5 mg total) by mouth 2 (two) times daily with a meal. 08/17/14  Yes [name], MD  loperamide (IMODIUM) 1 MG/5ML solution Take 2 mg by mouth as needed for diarrhea or loose stools.   Yes Historical Provider, MD  losartan (COZAAR) 25 MG tablet Take 0.5 tablets (12.5 mg total) by mouth daily. Only takes when BP is higher then 90 SBP 08/17/14  Yes [name], MD  metaxalone (SKELAXIN) 800 MG  tablet Take 800 mg by mouth daily as needed. As needed   Yes Historical Provider, MD  Multiple Vitamins-Minerals (MULTIVITAMIN WITH MINERALS) tablet Take 1 tablet by mouth daily.   Yes Historical Provider, MD  pantoprazole (PROTONIX) 40 MG tablet Take 1 tablet (40 mg total) by mouth daily. 07/20/14  Yes [name], MD  potassium chloride 20 MEQ TBCR Take 1  tablet (34meq) by mouth on Mondays and Fridays (with Lasix). Hold if weight is 196 lbs or less. 09/02/14  Yes Dayna N Dunn, PA-C  rosuvastatin (CRESTOR) 10 MG tablet Take 10 mg by mouth daily.   Yes Historical Provider, MD  SIMETHICONE PO Take 1 tablet by mouth daily as needed (bloating). Take 1 chewable tablet of [facility] Gas-Ex as needed for bloating.   Yes Historical Provider, MD  traZODone (DESYREL) 100 MG tablet Take 50 mg by mouth at bedtime.   Yes Historical Provider, MD  warfarin (COUMADIN) 4 MG tablet Take 1 tablet (4 mg total) by mouth daily. Patient taking differently: Take 2-4 mg by mouth daily. Patient takes 2 mg on Monday and Friday. All other days patient takes 4 mg 09/02/14  Yes Dayna N Dunn, PA-C  zolpidem (AMBIEN) 10 MG tablet Take 1 tablet (10 mg total) by mouth at bedtime as needed for sleep. 07/20/14 09/10/14 Yes Shaune Pascal Bensimhon, MD  nitroGLYCERIN (NITROSTAT) 0.3 MG SL tablet Place 0.3 mg under the tongue every 5 (five) minutes as needed for chest pain.    Historical Provider, MD   No Known Allergies  FAMILY HISTORY:  family history includes Hypertension in his mother. SOCIAL HISTORY:  reports that he quit smoking about 41 years ago. He does not have any smokeless tobacco history on file. He reports that he does not drink alcohol or use illicit drugs.  REVIEW OF SYSTEMS:   All negative; except for those that are bolded, which indicate positives.  Constitutional: weight loss, weight gain, night sweats, fevers, chills, fatigue, weakness.  HEENT: headaches, sore throat, sneezing, nasal congestion, post nasal drip, difficulty swallowing, tooth/dental problems, visual complaints, visual changes, ear aches. Neuro: difficulty with speech, weakness, numbness, ataxia. CV:  chest pain, orthopnea, PND, swelling in lower extremities, dizziness, palpitations, syncope.  Resp: cough, hemoptysis, dyspnea on exertion, wheezing. GI  heartburn, indigestion, abdominal pain, nausea, vomiting,  diarrhea, constipation, change in bowel habits, loss of appetite, hematemesis, melena, hematochezia.  GU: dysuria, change in color of urine, urgency or frequency, flank pain, hematuria. MSK: joint pain or swelling, decreased range of motion. Psych: change in mood or affect, depression, anxiety, suicidal ideations, homicidal ideations. Skin: rash, itching, bruising.    SUBJECTIVE:   VITAL SIGNS: Temp:  [97.2 F (36.2 C)-98.4 F (36.9 C)] 97.2 F (36.2 C) (03/17 0800) Pulse Rate:  [97-113] 103 (03/17 0800) Resp:  [15-40] 25 (03/17 0800) BP: (88-109)/(61-88) 99/78 mmHg (03/17 0800) SpO2:  [92 %-99 %] 96 % (03/17 0800) Weight:  [84.823 kg (187 lb)] 84.823 kg (187 lb) (03/17 0300)  PHYSICAL EXAMINATION: General: Chronically ill appearing male, sitting in bed, in NAD. Neuro: A&O x 3, non-focal.  HEENT: [place]/AT. PERRL, sclerae anicteric. Cardiovascular: RRR, no M/R/G.  Lungs: Respirations even and unlabored.  Diminished BS in bases, R > L.  Dullness R base. Abdomen: BS x 4, soft, NT/ND.  Musculoskeletal: No gross deformities, no edema.  Skin: Intact, warm, no rashes.     Recent Labs Lab 09/14/14 0410 09/15/14 1000 09/16/14 0359  NA 139 135 137  K 4.4 4.4 5.0  CL  101 99 101  CO2 34* 28 27  BUN 11 11 16   CREATININE 0.95 0.93 1.30  GLUCOSE 139* 256* 157*    Recent Labs Lab 09/13/14 0547 09/15/14 1000 09/16/14 0359  HGB 10.9* 11.0* 10.9*  HCT 35.8* 35.8* 36.6*  WBC 12.3* 12.1* 13.3*  PLT 743* 642* 586*   Ct Abdomen Wo Contrast  09/16/2014   CLINICAL DATA:  Evaluate for LVAD placement. Evaluate for LVAD placement. Prior myocardial infarction and coronary artery disease.  EXAM: CT CHEST AND ABDOMEN WITHOUT CONTRAST  TECHNIQUE: Multidetector CT imaging of the chest and abdomen was performed following the standard protocol without intravenous contrast.  COMPARISON:  Chest CT 08/22/2014  FINDINGS: CT CHEST FINDINGS  Mediastinum/Nodes: Central venous catheter tip terminates at  the superior cavoatrial junction. Heart remains enlarged. There is peripheral low attenuation along the left ventricular apex. Coronary arterial vascular calcifications. Multiple prominent mediastinal lymph nodes are demonstrated. Additionally there is an enlarged subcarinal lymph node measuring 2 cm.  Lungs/Pleura: The central airways are patent. Interval increase in size of now moderate layering right pleural effusion. Trace left pleural effusion. Increased consolidation involving the right lower lobe and right middle lobes. Patchy ground-glass and consolidative opacities within the left upper and left lower lobes. No pneumothorax. 3 mm calcified left lower lobe pulmonary nodule.  Musculoskeletal: Multilevel degenerative changes. No aggressive or acute appearing osseous lesions.  CT ABDOMEN FINDINGS  Hepatobiliary: Liver is diffusely low in attenuation compatible with hepatic steatosis. Cholelithiasis. No gallbladder wall thickening.  Pancreas: Unremarkable  Spleen: Multi lobular in appearance.  Adrenals/Urinary Tract: Normal bilateral adrenal glands. Bilateral nonobstructing nephrolithiasis measuring up to 7 mm within the interpolar region of the left kidney and 6 mm within the inferior pole of the right kidney.  Stomach/Bowel: Visualized small bowel is unremarkable. Visualize large bowel is unremarkable.  Vascular/Lymphatic: Inferior vena cava filter in place. Normal caliber abdominal aorta. No retroperitoneal lymphadenopathy.  Other: None  Musculoskeletal: Degenerative changes of the visualized lumbar spine without aggressive or acute appearing osseous lesions.  IMPRESSION: 1. Interval increase in size of now moderate right and small left pleural effusions. 2. Interval development as well as increase in size of consolidative opacities involving the right middle and right lower lobes as well as patchy ground-glass and consolidation within the left upper and left lower lobes. Findings, particularly within the  right lung may represent infection, infarction, edema and/or atelectasis. 3. Persistent irregularity of the left ventricular apex potentially representing mural thrombus. 4. Nonspecific enlarged subcarinal lymph node measuring approximately 2 cm. This may potentially be reactive in etiology. Consider evaluation with laboratory analysis and attention on follow-up imaging.   Electronically Signed   By: Lovey Newcomer M.D.   On: 09/16/2014 08:08   Dg Orthopantogram  09/16/2014   CLINICAL DATA:  Preoperative examination prior to placement of a left ventricular assist device.  EXAM: ORTHOPANTOGRAM/PANORAMIC  COMPARISON:  None.  FINDINGS: The mandible appears intact where visualized. There is no lytic nor blastic lesion. The teeth exhibit no acute abnormalities.  IMPRESSION: No acute abnormality of the mandible is demonstrated on this Panorex study.   Electronically Signed   By: David  Martinique   On: 09/16/2014 07:55   Ct Chest Wo Contrast  09/16/2014   CLINICAL DATA:  Evaluate for LVAD placement. Evaluate for LVAD placement. Prior myocardial infarction and coronary artery disease.  EXAM: CT CHEST AND ABDOMEN WITHOUT CONTRAST  TECHNIQUE: Multidetector CT imaging of the chest and abdomen was performed following the standard protocol without intravenous  contrast.  COMPARISON:  Chest CT 08/22/2014  FINDINGS: CT CHEST FINDINGS  Mediastinum/Nodes: Central venous catheter tip terminates at the superior cavoatrial junction. Heart remains enlarged. There is peripheral low attenuation along the left ventricular apex. Coronary arterial vascular calcifications. Multiple prominent mediastinal lymph nodes are demonstrated. Additionally there is an enlarged subcarinal lymph node measuring 2 cm.  Lungs/Pleura: The central airways are patent. Interval increase in size of now moderate layering right pleural effusion. Trace left pleural effusion. Increased consolidation involving the right lower lobe and right middle lobes. Patchy  ground-glass and consolidative opacities within the left upper and left lower lobes. No pneumothorax. 3 mm calcified left lower lobe pulmonary nodule.  Musculoskeletal: Multilevel degenerative changes. No aggressive or acute appearing osseous lesions.  CT ABDOMEN FINDINGS  Hepatobiliary: Liver is diffusely low in attenuation compatible with hepatic steatosis. Cholelithiasis. No gallbladder wall thickening.  Pancreas: Unremarkable  Spleen: Multi lobular in appearance.  Adrenals/Urinary Tract: Normal bilateral adrenal glands. Bilateral nonobstructing nephrolithiasis measuring up to 7 mm within the interpolar region of the left kidney and 6 mm within the inferior pole of the right kidney.  Stomach/Bowel: Visualized small bowel is unremarkable. Visualize large bowel is unremarkable.  Vascular/Lymphatic: Inferior vena cava filter in place. Normal caliber abdominal aorta. No retroperitoneal lymphadenopathy.  Other: None  Musculoskeletal: Degenerative changes of the visualized lumbar spine without aggressive or acute appearing osseous lesions.  IMPRESSION: 1. Interval increase in size of now moderate right and small left pleural effusions. 2. Interval development as well as increase in size of consolidative opacities involving the right middle and right lower lobes as well as patchy ground-glass and consolidation within the left upper and left lower lobes. Findings, particularly within the right lung may represent infection, infarction, edema and/or atelectasis. 3. Persistent irregularity of the left ventricular apex potentially representing mural thrombus. 4. Nonspecific enlarged subcarinal lymph node measuring approximately 2 cm. This may potentially be reactive in etiology. Consider evaluation with laboratory analysis and attention on follow-up imaging.   Electronically Signed   By: Lovey Newcomer M.D.   On: 09/16/2014 08:08   Right Pleural Space 3/17:     ASSESSMENT / PLAN:  Known PE / RLE DVT - on heparin  (coumadin as outpatient) Recs: Continue hepartin gtt in lieu of outpatient Coumadin + Plavix (hold for thoracentesis, see below).  Bilateral pleural effusions R > L - with associated compressive atx on right Recs: Hold heparin temporarily for thoracentesis. FFP x 2 units. Restart heparin ~ 1hr after thoracentesis done. CXR post thoracentesis. Send fluid for routine labs. Continue diuresis as able. Incentive spirometry.   Montey Hora, [facility] Pager: [phone]  or [phone] 09/16/2014, 12:34 PM    STAFF NOTE: I, Merrie Roof, MD FACP have personally reviewed patient's available data, including medical history, events of note, physical examination and test results as part of my evaluation. I have discussed with resident/NP and other care providers such as pharmacist, RN and RRT. In addition, I personally evaluated patient and elicited key findings of: he does have SOB with exertion, the CT is concerning in that iti increasing and compression ATX worse, for LVAD would favor thoracentesis to reduce risk post op resp failure and complications, requires FFP for procedure, have [place] the chest to correlate with CT prior to any FFP, updated wife and pt, d/w CHF service, reduced on exam rt base  Lavon Paganini. Titus Mould, MD, [place] Pgr: [phone] [place] [facility] 09/16/2014 1:59 PM

## 2014-09-16 NOTE — Progress Notes (Addendum)
ANTICOAGULATION CONSULT NOTE - Follow-Up Consult  Pharmacy Consult for heparin Indication: h/o bilateral PE and RLE DVT  No Known Allergies  Patient Measurements: Height: 5\' 8"  (172.7 cm) Weight: 187 lb (84.823 kg) IBW/kg (Calculated) : 68.4  Vital Signs: Temp: 97.2 F (36.2 C) (03/17 0800) Temp Source: Oral (03/17 0800) BP: 99/78 mmHg (03/17 0800) Pulse Rate: 103 (03/17 0800)  Labs:  Recent Labs  09/14/14 0410  09/14/14 2014 09/15/14 1000 09/15/14 1124 09/16/14 0359  HGB  --   --   --  11.0*  --  10.9*  HCT  --   --   --  35.8*  --  36.6*  PLT  --   --   --  642*  --  586*  LABPROT 20.3*  --   --   --  22.0* 22.9*  INR 1.72*  --   --   --  1.90* 2.01*  HEPARINUNFRC  --   < > 0.50  --  0.41 0.88*  CREATININE 0.95  --   --  0.93  --  1.30  < > = values in this interval not displayed.  Estimated Creatinine Clearance: 59.3 mL/min (by C-G formula based on Cr of 1.3).   Medical History: Past Medical History  Diagnosis Date  . Hyperlipidemia   . Hypertension   . Diabetes   . Nephrolithiasis   . Ankylosing spondylitis   . CAD (coronary artery disease)     a. stenting of LCx 2013; b. STEMI 06/12/14 s/p PCI to LAD complicated by post cath shock requiring IABP; VT s/p DCCV, EF 20%; c. NSTEMI 06/26/14 treated medically.  . Ischemic cardiomyopathy     a. echo 08/23/2014 EF <20%, dilated CM, mod MR/TR  . Acute on chronic respiratory failure     a. 08/2014 in setting of PE.  . Bilateral pulmonary embolism     a. 08/2014 - started on Coumadin. Retrievable IVC filter placed 08/27/14 due to RV strain and large clot burden.  . Right leg DVT     a. 08/2014.  Marland Kitchen Chronic systolic CHF (congestive heart failure)   . Hypotension   . Hemoptysis     a. 08/2014 possibly due to pulm infarct/PE.  Marland Kitchen Pleural effusion on right 08/2014 - small  . Leukocytosis   . Carotid artery disease     a. s/p stenting.  . Ventricular tachycardia     a. 06/2014 at time of MI, s/p DCCV.  Marland Kitchen Reactive  thrombocytosis 09/10/2014  . Leukocytosis 09/10/2014    Assessment: 69yo male on warf PTA for h/o recent bilat PE and RLE DVT, s/p IVC filter (08/2014), admitted 3/11 with low output HF with vo overload.  Rx consulted to dose hep/warf.  PMH: CAD s/p LAD stent in 2013, DM, carotid stenting, ankylosing spondylitis, bilateral PE(08/2014), IVC filter  Coag/heme:  Hx PE, IVC filter,  Warfarin/plavix stopped 3/17 On heparin gtt *DES 12/15> bridge antiplt tx; Plan per Ezzie Dural: 2d off plavix then start tirofiban at "renal dose"> 0.075 mcg/kg/min (start Saturday am) PTA Warfarin2mg  Mon and Fri, 4mg  all other days  Infectious Disease  Afebrile, WBC climbing, CT pulmonary hemmorage v infection, starting broad spectrum ABx 3/17 3/17>> vanc 3/17>> ceftaz  Cardiovascular: recent admission 06/2014  STEMI followed by cardiogenic shock requiring IABP; DES to LAD, RHC 3/11>>CI 1.8 and PCWP 23; ECHO 3/12: LVEF 15%, RV mild-mod dilated; Also had brief 2 min of asystole AM 3/11 (noted on lifevest) Admit wt 200lbs; LVAD discussion in progress, possibly early  next week. now 187lbs, I/O 1744/1150; BP 90-100, HR 90-100s, NSR, Coox 39 On milrinone 0.25, furo 40, spiro, dig(level 0.6 on admit), crestor, hold ARB and BB (BP soft) PTA coreg, ivabradine, losartan, plavix, dig , ASA  Endocrinology: gluc ok PTA: sulfasalazine, indomethacin  Gastrointestinal / Nutrition: Heart diet, LFTs slightly elevated  Neurology: alert, GCS 15  Nephrology: CrCl ~60 mL/min, SCr 1.3,  mag 2.5 (3/11) K 5.0  Pulmonary: Desats overnight, started on 2L [place] - improved  Best Practices: heparin  Goal of Therapy:  Heparin level 0.3-0.7 units/ml Monitor platelets by anticoagulation protocol: Yes   Plan:  - Continue Heparin gtt 1400 units/hr  - DC warfarin & clopidogrel - daily HL, INR, CBC, s/sx of bleeding - Plan to start tirofiban on Saturday  Thank you for allowing pharmacy to be a part of this patients care team.  Rowe Robert Pharm.D., BCPS, AQ-Cardiology Clinical Pharmacist 09/16/2014 11:46 AM Pager: [phone] Phone: [phone]  2:04 PM Heparin level at goal.  Noted plan for thoracentesis,follow up plan to resume anticoagulation post.  Kendon Sedeno C  5:17 PM Heparin to resume 4h after thoracentesis completed at 5p.  Will resume at 9pm, and checkheparin level with am labs. Jarion Hawthorne C

## 2014-09-16 NOTE — Progress Notes (Signed)
ANTICOAGULATION CONSULT NOTE - Follow Up Consult  Pharmacy Consult for heparin Indication: h/o PE/DVT  Labs:  Recent Labs  09/14/14 0410  09/14/14 2014 09/15/14 1000 09/15/14 1124 09/16/14 0359  HGB  --   --   --  11.0*  --  10.9*  HCT  --   --   --  35.8*  --  36.6*  PLT  --   --   --  642*  --  586*  LABPROT 20.3*  --   --   --  22.0*  --   INR 1.72*  --   --   --  1.90*  --   HEPARINUNFRC  --   < > 0.50  --  0.41 0.88*  CREATININE 0.95  --   --  0.93  --   --   < > = values in this interval not displayed.   Assessment: 69yo male subtherapeutic on heparin after two levels at goal; lab drawn correctly per RN.  Goal of Therapy:  Heparin level 0.3-0.7 units/ml   Plan:  Will decrease heparin gtt by 2 units/kg/hr to 1400 units/hr and check level in 6hr.  Wynona Neat, PharmD, BCPS  09/16/2014,5:53 AM

## 2014-09-16 NOTE — Consult Note (Signed)
Patient QI:[id number] [name]      DOB: September 04, 1945      ZDG:[id number]     Consult Note from the Palliative Medicine Team at [facility] Requested by: Dr. Haroldine Laws     PCP: [name], MD Reason for Consultation: VAD eval      Phone Number:[phone]  Assessment of patients Current state: I met today with Scott Scott [name] and Scott Scott [name] at bedside. They seem very happy with the information that they have received regarding VAD and have read through the literature. We discussed briefly the VAD and the hopes for improving QOL. He enjoys working on cars and used to enjoy traveling. He tells me that he is hoping for the best with VAD (acknowledging he has no other good options), He seems very realistic in acknowledging risks and that he knows this will hopefully be able to give him more time. They tell me they have already discussed and completed Advance Directives (this is uploaded into EPIC). He would not want "heroics" if interventions are failing or not available. I also informed them that I am here to help with any decisions that they may be faced with such as artificial feeding, dialysis, etc.    Goals of Care: 1.  Code Status: NO Intubation   2. Disposition: To be determined   3. Symptom Management:   1. Weakness/fatigue: Continue management per heart failure team. From palliative standpoint he appears a good candidate for VAD - Scott [name] is very supportive and he has good perspective.   4. Psychosocial: Emotional support provided to patient and Scott [name] at bedside.   5. Spiritual: He does mention in conversation that the Reita Cliche has a plan and he is not worried.    Brief HPI: 69 yo with h/o CAD with stenting of LCX 2013, DM2, carotid stenting and ankylosing spondylitis. Admitted 06/1214 with anterior STEMI with occluded LAD. Post-cath developed shock and IABP placed. ECHO 06/12/2014. EF 20%. Then developed VT and respiratory failure. IABP placed. Developed VT and  respiratory failure post cath. Cardioverted and intubated. Amio started .Discharged home 12/17. Weight 212. February 2/16 admitted to [facility] with dyspnea --> bilateral PE + DVT. Transferred to [facility] for further management. Started on heparin and warfarin. Had IVC filter placed 2//26.He was discharged on coumadin + plavix. Discharge weight was 195 pounds. Admitted d/t 2 min period of near asystole with HR < 20. Sleeping at time. PMH reviewed below.     ROS: + weakness, fatigue    PMH:  Past Medical History  Diagnosis Date  . Hyperlipidemia   . Hypertension   . Diabetes   . Nephrolithiasis   . Ankylosing spondylitis   . CAD (coronary artery disease)     a. stenting of LCx 2013; b. STEMI 06/12/14 s/p PCI to LAD complicated by post cath shock requiring IABP; VT s/p DCCV, EF 20%; c. NSTEMI 06/26/14 treated medically.  . Ischemic cardiomyopathy     a. echo 08/23/2014 EF <20%, dilated CM, mod MR/TR  . Acute on chronic respiratory failure     a. 08/2014 in setting of PE.  . Bilateral pulmonary embolism     a. 08/2014 - started on Coumadin. Retrievable IVC filter placed 08/27/14 due to RV strain and large clot burden.  . Right leg DVT     a. 08/2014.  Marland Kitchen Chronic systolic CHF (congestive heart failure)   . Hypotension   . Hemoptysis     a. 08/2014 possibly due to pulm infarct/PE.  Marland Kitchen  Pleural effusion on right 08/2014 - small  . Leukocytosis   . Carotid artery disease     a. s/p stenting.  . Ventricular tachycardia     a. 06/2014 at time of MI, s/p DCCV.  Marland Kitchen Reactive thrombocytosis 09/10/2014  . Leukocytosis 09/10/2014     PSH: Past Surgical History  Procedure Laterality Date  . Left heart catheterization with coronary angiogram N/A 06/12/2014    Procedure: LEFT HEART CATHETERIZATION WITH CORONARY ANGIOGRAM;  Surgeon: Lorretta Harp, MD;  Location: [facility] CATH LAB;  Service: Cardiovascular;  Laterality: N/A;  . Right heart catheterization N/A 09/10/2014    Procedure: RIGHT HEART CATH;  Surgeon:  [name], MD;  Location: [facility] CATH LAB;  Service: Cardiovascular;  Laterality: N/A;   I have reviewed the FH and SH and  If appropriate update it with new information. No Known Allergies Scheduled Meds: . sodium chloride   Intravenous Once  . cefTAZidime (FORTAZ)  IV  2 g Intravenous 3 times per day  . digoxin  0.125 mg Oral Daily  . furosemide  40 mg Oral Daily  . rosuvastatin  10 mg Oral q1800  . sodium chloride  10-40 mL Intracatheter Q12H  . sodium chloride  3 mL Intravenous Q12H  . spironolactone  12.5 mg Oral Daily  . vancomycin  1,000 mg Intravenous Q12H   Continuous Infusions: . heparin 1,400 Units/hr (09/16/14 1229)  . milrinone 0.25 mcg/kg/min (09/15/14 1252)   PRN Meds:.sodium chloride, acetaminophen, ondansetron (ZOFRAN) IV, sodium chloride, sodium chloride, traMADol    BP 99/78 mmHg  Pulse 103  Temp(Src) 97.3 F (36.3 C) (Oral)  Resp 25  Ht 5' 8"  (1.727 m)  Wt 84.823 kg (187 lb)  BMI 28.44 kg/m2  SpO2 96%   PPS: 30%   Intake/Output Summary (Last 24 hours) at 09/16/14 1401 Last data filed at 09/16/14 0900  Gross per 24 hour  Intake 1336.93 ml  Output    550 ml  Net 786.93 ml    Physical Exam:  General: NAD, sitting up in recliner HEENT: [place]/AT, +JVD, moist mucous membranes Chest: No labored breathing, symmetric CVS: Tachycardic Abdomen: Soft, NT, ND Ext: MAE, no edema, warm to touch Neuro: Alert, oriented x 3  Labs: CBC    Component Value Date/Time   WBC 13.3* 09/16/2014 0359   RBC 3.89* 09/16/2014 0359   HGB 10.9* 09/16/2014 0359   HCT 36.6* 09/16/2014 0359   PLT 586* 09/16/2014 0359   MCV 94.1 09/16/2014 0359   MCH 28.0 09/16/2014 0359   MCHC 29.8* 09/16/2014 0359   RDW 17.5* 09/16/2014 0359   LYMPHSABS 1.6 06/13/2014 0428   MONOABS 2.0* 06/13/2014 0428   EOSABS 0.0 06/13/2014 0428   BASOSABS 0.0 06/13/2014 0428    BMET    Component Value Date/Time   NA 137 09/16/2014 0359   K 5.0 09/16/2014 0359   CL 101 09/16/2014  0359   CO2 27 09/16/2014 0359   GLUCOSE 157* 09/16/2014 0359   BUN 16 09/16/2014 0359   CREATININE 1.30 09/16/2014 0359   CALCIUM 8.8 09/16/2014 0359   GFRNONAA 56* 09/16/2014 0359   GFRAA 64* 09/16/2014 0359    CMP     Component Value Date/Time   NA 137 09/16/2014 0359   K 5.0 09/16/2014 0359   CL 101 09/16/2014 0359   CO2 27 09/16/2014 0359   GLUCOSE 157* 09/16/2014 0359   BUN 16 09/16/2014 0359   CREATININE 1.30 09/16/2014 0359   CALCIUM 8.8 09/16/2014 0359  PROT 7.3 09/10/2014 1000   ALBUMIN 2.8* 09/10/2014 1000   AST 83* 09/10/2014 1000   ALT 118* 09/10/2014 1000   ALKPHOS 322* 09/10/2014 1000   BILITOT 0.8 09/10/2014 1000   GFRNONAA 56* 09/16/2014 0359   GFRAA 64* 09/16/2014 0359    Time In Time Out Total Time Spent with Patient Total Overall Time  1230 1330 87mn 648m    Greater than 50%  of this time was spent counseling and coordinating care related to the above assessment and plan.  AlVinie SillNP Palliative Medicine Team Pager # [phone]-F 8a-5p) Team Phone # [phone]/Weekends)

## 2014-09-16 NOTE — Progress Notes (Signed)
CSW ordered received for LVAD assessment needed for patient.  CSW discussed with Alexis Frock- [facility]. She stated that she would initiate the LVAD assessment for this patient.  CSW will monitor and will be available for support as needed.  Lorie Phenix. Pauline Good, [place]

## 2014-09-16 NOTE — Progress Notes (Signed)
Scott [name] has been discussed with the VAD Medical Review board on 09/13/14. The team feels as if the patient is a good candidate for Destination LVAD therapy. The patient meets criteria for a LVAD implant as listed below:  1)  Has failed to respond to optimal medical management (including beta-blockers and ACE inhibitors if tolerated) for      at least 45 of the last 60 days, or have been balloon dependent for 7 days, or IV inotrope dependent for 14 days; and,       *On Inotropes Milrinone 0.25 mcg/kg/min started 09/10/14  2)  Has a left ventricular ejection fraction (LVEF) < 25% and, have demonstrated functional limitation with a peak                oxygen consumption of <14 ml/kg/min unless balloon pump or inotrope dependent or physically unable to perform       the test.         *EF < 20%  By echo (date)  08/23/14            *CPX (date)  pVO2 ____ RER_____ VE/VCo2 slope_____ *unable to complete d/t milrinone  3)  Social work and palliative care evaluations demonstrate appropriate support system in place for discharge to                  home with a VAD and that end of life discussions have taken place. Both services have expressed no concern               regarding Scott [name] candidacy aside from a lack of second caregiver; however she feels strongly that he is a       good candidate and his wife is dependable and they could reach out to their church and identify a second (see                Scott [name] note for further details).         *Social work consult (date): 09/16/14        *Palliative Care Consult (date): 09/16/14  4)  Primary caretaker identified that can be taught along with the patient how to manage        the VAD equipment.        *Name: Scott [name]  (wife)    5)  Deemed appropriate by our financial coordinator: 09/16/14        Prior approval: No prior-authorization required  6)  VAD Coordinator, Janene Madeira has met with patient and caregiver, shown  them the VAD equipment and                   discussed with the patient and caregiver about lifestyle changes necessary for success on mechanical circulatory         device.        *Met with Scott [name] on 09/13/14.       *Consent for VAD Evaluation/Caregiver Agreement/HIPPA Release/Photo Release signed on 09/15/14  7)  Patient is deteriorating inpatient and the decision has been made by the surgical team to forego the discussion of      transplant with [facility] due to INTERMACS Profile 2   8)  Intermacs profile: 2   INTERMACS 1: Critical cardiogenic shock describes a patient who is "crashing and burning", in which a patient has life-threatening hypotension and rapidly escalating inotropic pressor support, with critical organ hypoperfusion often confirmed by worsening acidosis and lactate levels.  INTERMACS  2: Progressive decline describes a patient who has been demonstrated "dependent" on inotropic support but nonetheless shows signs of continuing deterioration in nutrition, renal function, fluid retention, or other major status indicator. Patient profile 2 can also describe a patient with refractory volume overload, perhaps with evidence of impaired perfusion, in whom inotropic infusions cannot be maintained due to tachyarrhythmias, clinical ischemia, or other intolerance.  INTERMACS 3: Stable but inotrope dependent describes a patient who is clinically stable on mild-moderate doses of intravenous inotropes (or has a temporary circulatory support device) after repeated documentation of failure to wean without symptomatic hypotension, worsening symptoms, or progressive organ dysfunction (usually renal). It is critical to monitor nutrition, renal function, fluid balance, and overall status carefully in order to distinguish between a  patient who is truly stable at Patient Profile 3 and a patient who has unappreciated decline rendering them Patient Profile 2. This patient may be either at  home or in the hospital.   INTERMACS 4: Resting symptoms describes a patient who is at home on oral therapy but frequently has symptoms of congestion at rest or with activities of daily living (ADL). He or she may have orthopnea, shortness of breath during ADL such as dressing or bathing, gastrointestinal symptoms (abdominal discomfort, nausea, poor appetite), disabling ascites or severe lower extremity edema. This patient should be carefully considered for more intensive management and surveillance programs, which may in some cases, reveal poor compliance that would compromise outcomes with any therapy.  .  INTERMACS 5: Exertion Intolerant describes a patient who is comfortable at rest but unable to engage in any activity, living predominantly within the house or housebound. This patient has no congestive symptoms, but may have chronically elevated volume status, frequently with renal dysfunction, and may be characterized as exercise intolerant.   INTERMACS 6: Exertion Limited also describes a patient who is comfortable at rest without evidence of fluid overload, but who is able to do some mild activity. Activities of daily living are comfortable and minor activities outside the home such as visiting friends or going to a restaurant can be performed, but fatigue results within a few minutes of any meaningful physical exertion. This patient has occasional episodes of worsening symptoms and is likely to have had a hospitalization for heart failure within the past year.   INTERMACS 7: Advanced NYHA Class 3 describes a patient who is clinically stable with a reasonable level of comfortable activity, despite history of previous decompensation that is not recent. This patient is usually able to walk more than a block. Any decompensation requiring intravenous diuretics or hospitalization within the previous month should make this person a Patient Profile 6 or lower.    9)  NYHA Class: IV

## 2014-09-16 NOTE — Progress Notes (Addendum)
Patient ID: Scott [name], male   DOB: 12-04-1945, 69 y.o.   MRN: [id number] Advanced Heart Failure Rounding Note   Subjective:    Feels worse today. Remains on milrinone @ 0.25 mcg.  Weight stable. Co-ox 39%. CVP remains 12. Cr up to 1.3  CT C/A/P done as part of VAD w/u reveals significant R pleural effusion and airspace disease - infection versus recent pulmonary infarct from PE.   Objective:   Weight Range:  Vital Signs:   Temp:  [97.2 F (36.2 C)-98.4 F (36.9 C)] 97.3 F (36.3 C) (03/17 1200) Pulse Rate:  [97-112] 104 (03/17 1400) Resp:  [15-38] 20 (03/17 1400) BP: (88-109)/(63-88) 105/79 mmHg (03/17 1400) SpO2:  [90 %-99 %] 99 % (03/17 1400) Weight:  [84.823 kg (187 lb)] 84.823 kg (187 lb) (03/17 0300) Last BM Date: 09/16/14  Weight change: Filed Weights   09/14/14 0300 09/15/14 0300 09/16/14 0300  Weight: 84.1 kg (185 lb 6.5 oz) 84.913 kg (187 lb 3.2 oz) 84.823 kg (187 lb)    Intake/Output:   Intake/Output Summary (Last 24 hours) at 09/16/14 1434 Last data filed at 09/16/14 0900  Gross per 24 hour  Intake 1336.93 ml  Output    550 ml  Net 786.93 ml     Physical Exam: General: lying in bed  No resp difficulty HEENT: normal Neck: supple. RIJ. JVP ~ 12. .  Carotids 2+ bilaterally; no bruits. No lymphadenopathy or thryomegaly appreciated. Cor: PMI laterally displaced. Tachy regular. +s3 Lungs: clear Abdomen: soft, nontender, nondistended. No hepatosplenomegaly. No bruits or masses. Good bowel sounds. Extremities: no cyanosis, clubbing, rash, no edema.  RUE PICC Neuro: alert & orientedx3, cranial nerves grossly intact. Moves all 4 extremities w/o difficulty. Affect pleasant.  Telemetry: ST 100-120   Labs: Basic Metabolic Panel:  Recent Labs Lab 09/10/14 1000  09/12/14 0430 09/13/14 0547 09/14/14 0410 09/15/14 1000 09/16/14 0359  NA 142  < > 140 140 139 135 137  K 5.5*  < > 3.5 3.8 4.4 4.4 5.0  CL 103  < > 99 99 101 99 101  CO2 30  < > 36* 32  34* 28 27  GLUCOSE 127*  < > 107* 134* 139* 256* 157*  BUN 28*  < > 16 11 11 11 16   CREATININE 1.35  < > 1.17 0.97 0.95 0.93 1.30  CALCIUM 8.6  < > 7.5* 8.0* 8.4 8.6 8.8  MG 2.5  --   --   --   --   --   --   < > = values in this interval not displayed.  Liver Function Tests:  Recent Labs Lab 09/10/14 1000  AST 83*  ALT 118*  ALKPHOS 322*  BILITOT 0.8  PROT 7.3  ALBUMIN 2.8*   No results for input(s): LIPASE, AMYLASE in the last 168 hours. No results for input(s): AMMONIA in the last 168 hours.  CBC:  Recent Labs Lab 09/10/14 1000 09/13/14 0547 09/15/14 1000 09/16/14 0359  WBC 13.2* 12.3* 12.1* 13.3*  HGB 10.7* 10.9* 11.0* 10.9*  HCT 35.1* 35.8* 35.8* 36.6*  MCV 92.6 91.3 91.6 94.1  PLT 1013* 743* 642* 586*    Cardiac Enzymes: No results for input(s): CKTOTAL, CKMB, CKMBINDEX, TROPONINI in the last 168 hours.  BNP: BNP (last 3 results)  Recent Labs  07/20/14 1053 08/27/14 0253 09/10/14 1000  BNP 773.6* 1971.6* 2173.4*    ProBNP (last 3 results) No results for input(s): PROBNP in the last 8760 hours.    Other results:  Imaging: Ct Abdomen Wo Contrast  09/16/2014   CLINICAL DATA:  Evaluate for LVAD placement. Evaluate for LVAD placement. Prior myocardial infarction and coronary artery disease.  EXAM: CT CHEST AND ABDOMEN WITHOUT CONTRAST  TECHNIQUE: Multidetector CT imaging of the chest and abdomen was performed following the standard protocol without intravenous contrast.  COMPARISON:  Chest CT 08/22/2014  FINDINGS: CT CHEST FINDINGS  Mediastinum/Nodes: Central venous catheter tip terminates at the superior cavoatrial junction. Heart remains enlarged. There is peripheral low attenuation along the left ventricular apex. Coronary arterial vascular calcifications. Multiple prominent mediastinal lymph nodes are demonstrated. Additionally there is an enlarged subcarinal lymph node measuring 2 cm.  Lungs/Pleura: The central airways are patent. Interval increase  in size of now moderate layering right pleural effusion. Trace left pleural effusion. Increased consolidation involving the right lower lobe and right middle lobes. Patchy ground-glass and consolidative opacities within the left upper and left lower lobes. No pneumothorax. 3 mm calcified left lower lobe pulmonary nodule.  Musculoskeletal: Multilevel degenerative changes. No aggressive or acute appearing osseous lesions.  CT ABDOMEN FINDINGS  Hepatobiliary: Liver is diffusely low in attenuation compatible with hepatic steatosis. Cholelithiasis. No gallbladder wall thickening.  Pancreas: Unremarkable  Spleen: Multi lobular in appearance.  Adrenals/Urinary Tract: Normal bilateral adrenal glands. Bilateral nonobstructing nephrolithiasis measuring up to 7 mm within the interpolar region of the left kidney and 6 mm within the inferior pole of the right kidney.  Stomach/Bowel: Visualized small bowel is unremarkable. Visualize large bowel is unremarkable.  Vascular/Lymphatic: Inferior vena cava filter in place. Normal caliber abdominal aorta. No retroperitoneal lymphadenopathy.  Other: None  Musculoskeletal: Degenerative changes of the visualized lumbar spine without aggressive or acute appearing osseous lesions.  IMPRESSION: 1. Interval increase in size of now moderate right and small left pleural effusions. 2. Interval development as well as increase in size of consolidative opacities involving the right middle and right lower lobes as well as patchy ground-glass and consolidation within the left upper and left lower lobes. Findings, particularly within the right lung may represent infection, infarction, edema and/or atelectasis. 3. Persistent irregularity of the left ventricular apex potentially representing mural thrombus. 4. Nonspecific enlarged subcarinal lymph node measuring approximately 2 cm. This may potentially be reactive in etiology. Consider evaluation with laboratory analysis and attention on follow-up  imaging.   Electronically Signed   By: Lovey Newcomer M.D.   On: 09/16/2014 08:08   Dg Orthopantogram  09/16/2014   CLINICAL DATA:  Preoperative examination prior to placement of a left ventricular assist device.  EXAM: ORTHOPANTOGRAM/PANORAMIC  COMPARISON:  None.  FINDINGS: The mandible appears intact where visualized. There is no lytic nor blastic lesion. The teeth exhibit no acute abnormalities.  IMPRESSION: No acute abnormality of the mandible is demonstrated on this Panorex study.   Electronically Signed   By: David  Martinique   On: 09/16/2014 07:55   Ct Chest Wo Contrast  09/16/2014   CLINICAL DATA:  Evaluate for LVAD placement. Evaluate for LVAD placement. Prior myocardial infarction and coronary artery disease.  EXAM: CT CHEST AND ABDOMEN WITHOUT CONTRAST  TECHNIQUE: Multidetector CT imaging of the chest and abdomen was performed following the standard protocol without intravenous contrast.  COMPARISON:  Chest CT 08/22/2014  FINDINGS: CT CHEST FINDINGS  Mediastinum/Nodes: Central venous catheter tip terminates at the superior cavoatrial junction. Heart remains enlarged. There is peripheral low attenuation along the left ventricular apex. Coronary arterial vascular calcifications. Multiple prominent mediastinal lymph nodes are demonstrated. Additionally there is an enlarged subcarinal lymph node  measuring 2 cm.  Lungs/Pleura: The central airways are patent. Interval increase in size of now moderate layering right pleural effusion. Trace left pleural effusion. Increased consolidation involving the right lower lobe and right middle lobes. Patchy ground-glass and consolidative opacities within the left upper and left lower lobes. No pneumothorax. 3 mm calcified left lower lobe pulmonary nodule.  Musculoskeletal: Multilevel degenerative changes. No aggressive or acute appearing osseous lesions.  CT ABDOMEN FINDINGS  Hepatobiliary: Liver is diffusely low in attenuation compatible with hepatic steatosis.  Cholelithiasis. No gallbladder wall thickening.  Pancreas: Unremarkable  Spleen: Multi lobular in appearance.  Adrenals/Urinary Tract: Normal bilateral adrenal glands. Bilateral nonobstructing nephrolithiasis measuring up to 7 mm within the interpolar region of the left kidney and 6 mm within the inferior pole of the right kidney.  Stomach/Bowel: Visualized small bowel is unremarkable. Visualize large bowel is unremarkable.  Vascular/Lymphatic: Inferior vena cava filter in place. Normal caliber abdominal aorta. No retroperitoneal lymphadenopathy.  Other: None  Musculoskeletal: Degenerative changes of the visualized lumbar spine without aggressive or acute appearing osseous lesions.  IMPRESSION: 1. Interval increase in size of now moderate right and small left pleural effusions. 2. Interval development as well as increase in size of consolidative opacities involving the right middle and right lower lobes as well as patchy ground-glass and consolidation within the left upper and left lower lobes. Findings, particularly within the right lung may represent infection, infarction, edema and/or atelectasis. 3. Persistent irregularity of the left ventricular apex potentially representing mural thrombus. 4. Nonspecific enlarged subcarinal lymph node measuring approximately 2 cm. This may potentially be reactive in etiology. Consider evaluation with laboratory analysis and attention on follow-up imaging.   Electronically Signed   By: Lovey Newcomer M.D.   On: 09/16/2014 08:08     Medications:     Scheduled Medications: . sodium chloride   Intravenous Once  . cefTAZidime (FORTAZ)  IV  2 g Intravenous 3 times per day  . digoxin  0.125 mg Oral Daily  . furosemide  40 mg Oral Daily  . rosuvastatin  10 mg Oral q1800  . sodium chloride  10-40 mL Intracatheter Q12H  . sodium chloride  3 mL Intravenous Q12H  . spironolactone  12.5 mg Oral Daily  . vancomycin  1,000 mg Intravenous Q12H    Infusions: . heparin 1,400  Units/hr (09/16/14 1229)  . milrinone 0.25 mcg/kg/min (09/15/14 1252)    PRN Medications: sodium chloride, acetaminophen, ondansetron (ZOFRAN) IV, sodium chloride, sodium chloride, traMADol   Assessment:   1. A/C systolic HF class IV with cardiogenic shock- EF 15% with moderate RV dysfunction 3/16 echo.  2. CAD s/p Anterior STEMI on 06/12/14 with stenting of LAD- on coumadin + plavix, statin.   3. 06/12/14 VT in setting of STEMI 4. Ankylosing spondylitis. 5. Bilateral PE with R DVT- S/P IVC filter 08/27/2014--->coumadin + plavix  6. Asystole noted from Life Vest 7. LV thrombus 8. Full Code 9. Hemoptysis 10. A/C renal failure 11. Pleural effusion  Plan/Discussion:    He is beginning to deteriorate again despite milrinone. Co-ox down and renal function worse. Will repeat co-ox, if < 50 will move to ICU for levophed support. Will likely need to have swan replaced and even possible IABP support. Will need VAD this admit. Dr. [name] to see.  CCM consulted and will tap effusion after giving FFP. Start broad spectrum abx.   Will stop coumadin and Plavix. Resume heparin after thoracentesis. I discussed anti-platelet regimen with Dr. Burt Knack. He has recent DES  to LAD in 12/15 in setting of STEMI. Will stop Plavix. In 48 hours start renal dose Tirofiban.  The patient is critically ill with multiple organ systems failure and requires high complexity decision making for assessment and support, frequent evaluation and titration of therapies, application of advanced monitoring technologies and extensive interpretation of multiple databases.   Critical Care Time devoted to patient care services described in this note is 35 Minutes.    Scott Charita Lindenberger,MD  2:34 PM  09/16/2014 Advanced Heart Failure Team Pager [phone] (M-F; 7a - 4p)  Please contact [facility] for night-coverage after hours (4p -7a ) and weekends on [email or web address]

## 2014-09-17 ENCOUNTER — Encounter (HOSPITAL_COMMUNITY)
Admission: AD | Disposition: A | Discharge status: Home Health | Payer: Self-pay | Source: Ambulatory Visit | Attending: Cardiothoracic Surgery

## 2014-09-17 ENCOUNTER — Encounter (HOSPITAL_COMMUNITY): Payer: Self-pay | Admitting: Certified Registered Nurse Anesthetist

## 2014-09-17 HISTORY — PX: RIGHT HEART CATHETERIZATION: SHX5447

## 2014-09-17 LAB — SURGICAL PCR SCREEN
MRSA, PCR: NEGATIVE
Staphylococcus aureus: NEGATIVE

## 2014-09-17 LAB — BASIC METABOLIC PANEL
ANION GAP: 6 (ref 5–15)
BUN: 18 mg/dL (ref 6–23)
CALCIUM: 8.5 mg/dL (ref 8.4–10.5)
CO2: 29 mmol/L (ref 19–32)
Chloride: 94 mmol/L — ABNORMAL LOW (ref 96–112)
Creatinine, Ser: 1.19 mg/dL (ref 0.50–1.35)
GFR calc Af Amer: 72 mL/min — ABNORMAL LOW (ref >90)
GFR calc non Af Amer: 62 mL/min — ABNORMAL LOW (ref >90)
GLUCOSE: 291 mg/dL — AB (ref 70–99)
POTASSIUM: 4.3 mmol/L (ref 3.5–5.1)
Sodium: 129 mmol/L — ABNORMAL LOW (ref 135–145)

## 2014-09-17 LAB — LUPUS ANTICOAGULANT PANEL
DRVVT: 39.8 s (ref 0.0–55.1)
PTT LA: 41.6 s (ref 0.0–50.0)

## 2014-09-17 LAB — CBC
HCT: 32.8 % — ABNORMAL LOW (ref 39.0–52.0)
HEMOGLOBIN: 9.9 g/dL — AB (ref 13.0–17.0)
MCH: 28.3 pg (ref 26.0–34.0)
MCHC: 30.2 g/dL (ref 30.0–36.0)
MCV: 93.7 fL (ref 78.0–100.0)
Platelets: 491 10*3/uL — ABNORMAL HIGH (ref 150–400)
RBC: 3.5 MIL/uL — AB (ref 4.22–5.81)
RDW: 17.4 % — ABNORMAL HIGH (ref 11.5–15.5)
WBC: 12.6 10*3/uL — ABNORMAL HIGH (ref 4.0–10.5)

## 2014-09-17 LAB — URINE MICROSCOPIC-ADD ON

## 2014-09-17 LAB — HEPARIN LEVEL (UNFRACTIONATED)
Heparin Unfractionated: 0.2 IU/mL — ABNORMAL LOW (ref 0.30–0.70)
Heparin Unfractionated: 0.48 IU/mL (ref 0.30–0.70)
Heparin Unfractionated: 0.23 IU/mL — ABNORMAL LOW (ref 0.30–0.70)

## 2014-09-17 LAB — CARBOXYHEMOGLOBIN
CARBOXYHEMOGLOBIN: 1.3 % (ref 0.5–1.5)
Carboxyhemoglobin: 1 % (ref 0.5–1.5)
Carboxyhemoglobin: 1.4 % (ref 0.5–1.5)
METHEMOGLOBIN: 0.8 % (ref 0.0–1.5)
METHEMOGLOBIN: 0.9 % (ref 0.0–1.5)
Methemoglobin: 0.9 % (ref 0.0–1.5)
O2 SAT: 52.3 %
O2 Saturation: 44.6 %
O2 Saturation: 68.8 %
TOTAL HEMOGLOBIN: 9.9 g/dL — AB (ref 13.5–18.0)
Total hemoglobin: 11.7 g/dL — ABNORMAL LOW (ref 13.5–18.0)
Total hemoglobin: 10.4 g/dL — ABNORMAL LOW (ref 13.5–18.0)

## 2014-09-17 LAB — POCT I-STAT 3, VENOUS BLOOD GAS (G3P V)
ACID-BASE EXCESS: 6 mmol/L — AB (ref 0.0–2.0)
Acid-Base Excess: 4 mmol/L — ABNORMAL HIGH (ref 0.0–2.0)
Bicarbonate: 30.8 mEq/L — ABNORMAL HIGH (ref 20.0–24.0)
Bicarbonate: 33.6 mEq/L — ABNORMAL HIGH (ref 20.0–24.0)
O2 SAT: 39 %
O2 Saturation: 38 %
PCO2 VEN: 57.6 mmHg — AB (ref 45.0–50.0)
PCO2 VEN: 61.4 mmHg — AB (ref 45.0–50.0)
PH VEN: 7.346 — AB (ref 7.250–7.300)
PO2 VEN: 24 mmHg — AB (ref 30.0–45.0)
TCO2: 33 mmol/L (ref 0–100)
TCO2: 35 mmol/L (ref 0–100)
pH, Ven: 7.337 — ABNORMAL HIGH (ref 7.250–7.300)
pO2, Ven: 25 mmHg — CL (ref 30.0–45.0)

## 2014-09-17 LAB — PREPARE FRESH FROZEN PLASMA
UNIT DIVISION: 0
Unit division: 0

## 2014-09-17 LAB — URINALYSIS, ROUTINE W REFLEX MICROSCOPIC
Bilirubin Urine: NEGATIVE
Glucose, UA: NEGATIVE mg/dL
Ketones, ur: NEGATIVE mg/dL
Leukocytes, UA: NEGATIVE
Nitrite: NEGATIVE
Protein, ur: NEGATIVE mg/dL
Specific Gravity, Urine: 1.015 (ref 1.005–1.030)
Urobilinogen, UA: 0.2 mg/dL (ref 0.0–1.0)
pH: 5.5 (ref 5.0–8.0)

## 2014-09-17 LAB — HEPATITIS B SURFACE ANTIBODY,QUALITATIVE: Hep B S Ab: NONREACTIVE

## 2014-09-17 LAB — PROCALCITONIN: Procalcitonin: 0.19 ng/mL

## 2014-09-17 LAB — PLATELET INHIBITION P2Y12: Platelet Function  P2Y12: 71 [PRU] — ABNORMAL LOW (ref 194–418)

## 2014-09-17 LAB — HEMOGLOBIN A1C
Hgb A1c MFr Bld: 6.8 % — ABNORMAL HIGH (ref 4.8–5.6)
Mean Plasma Glucose: 148 mg/dL

## 2014-09-17 LAB — PROTIME-INR
INR: 1.64 — ABNORMAL HIGH (ref 0.00–1.49)
Prothrombin Time: 19.5 seconds — ABNORMAL HIGH (ref 11.6–15.2)

## 2014-09-17 LAB — AMYLASE, PLEURAL FLUID: Amylase, Pleural Fluid: 31 U/L

## 2014-09-17 LAB — HEPATITIS B CORE ANTIBODY, TOTAL: Hep B Core Total Ab: NEGATIVE

## 2014-09-17 LAB — OCCULT BLOOD X 1 CARD TO LAB, STOOL: FECAL OCCULT BLD: NEGATIVE

## 2014-09-17 SURGERY — RIGHT HEART CATH
Anesthesia: LOCAL

## 2014-09-17 MED ORDER — SODIUM CHLORIDE 0.9 % IJ SOLN
3.0000 mL | Freq: Two times a day (BID) | INTRAMUSCULAR | Status: DC
  Administered 2014-09-18 – 2014-09-20 (×4): 3 mL via INTRAVENOUS

## 2014-09-17 MED ORDER — NITROGLYCERIN 1 MG/10 ML FOR IR/CATH LAB
INTRA_ARTERIAL | Status: AC
  Filled 2014-09-17: qty 10

## 2014-09-17 MED ORDER — FUROSEMIDE 10 MG/ML IJ SOLN
80.0000 mg | Freq: Two times a day (BID) | INTRAMUSCULAR | Status: DC
  Administered 2014-09-17 – 2014-09-18 (×3): 80 mg via INTRAVENOUS
  Filled 2014-09-17 (×5): qty 8

## 2014-09-17 MED ORDER — ACETAMINOPHEN 325 MG PO TABS: 650.0000 mg | ORAL_TABLET | ORAL | Status: DC | PRN

## 2014-09-17 MED ORDER — LIDOCAINE HCL (PF) 1 % IJ SOLN
INTRAMUSCULAR | Status: AC
  Filled 2014-09-17: qty 30

## 2014-09-17 MED ORDER — MIDAZOLAM HCL 2 MG/2ML IJ SOLN
INTRAMUSCULAR | Status: AC
  Filled 2014-09-17: qty 2

## 2014-09-17 MED ORDER — SODIUM CHLORIDE 0.9 % IV SOLN: 250.0000 mL | INTRAVENOUS | Status: DC | PRN

## 2014-09-17 MED ORDER — HEPARIN (PORCINE) IN NACL 2-0.9 UNIT/ML-% IJ SOLN
INTRAMUSCULAR | Status: AC
  Filled 2014-09-17: qty 1000

## 2014-09-17 MED ORDER — SODIUM CHLORIDE 0.9 % IJ SOLN: 3.0000 mL | INTRAMUSCULAR | Status: DC | PRN

## 2014-09-17 MED ORDER — SODIUM CHLORIDE 0.9 % IJ SOLN
3.0000 mL | INTRAMUSCULAR | Status: DC | PRN
Start: 2014-09-17 — End: 2014-09-17

## 2014-09-17 MED ORDER — ONDANSETRON HCL 4 MG/2ML IJ SOLN: 4.0000 mg | Freq: Four times a day (QID) | INTRAMUSCULAR | Status: DC | PRN

## 2014-09-17 MED ORDER — SODIUM CHLORIDE 0.9 % IJ SOLN
3.0000 mL | Freq: Two times a day (BID) | INTRAMUSCULAR | Status: DC
  Administered 2014-09-17: 3 mL via INTRAVENOUS

## 2014-09-17 MED ORDER — ASPIRIN 81 MG PO CHEW
81.0000 mg | CHEWABLE_TABLET | ORAL | Status: AC
  Administered 2014-09-17: 81 mg via ORAL
  Filled 2014-09-17: qty 1

## 2014-09-17 NOTE — CV Procedure (Signed)
Cardiac Cath Procedure Note:  Indication:  Cardiogenic shock despite inotrope support.  Procedures performed:  1) Right heart catheterization 2) Swan ganz placement 3) IABP placement  Description of procedure:   The risks and indication of the procedure were explained. Consent was signed and placed on the chart. An appropriate timeout was taken prior to the procedure. The right neck and right groin areas were prepped and draped in the routine sterile fashion and anesthetized with 1% local lidocaine.   Using ultrasound guidance and a micropuncture kit the RIJ was accessed and a 5FR sheath was placed. The Sheath was then switched over a wire for a 7 FR venous sheath. A standard Swan-Ganz catheter was used for the procedure. The tip of the catheter was positioned in the right PA under fluoro guidance and the sheath was sutures in place.   A 7.5 FR IABP sheath was placed in the right femoral artery using a modified Seldinger technique. A 40 cm IABP was placed over a wire under fluoroscopic guidance. Once appropriate position was confirmed the IABP was flushed and then was sutured in place.  Complications:  None apparent   Findings:  On milrinone 0.25 levophed 10   RA = 15 RV =  56/5/20  PA = 59/30 (39) PCW = 32 with v waves to 40 Fick cardiac output/index = 3.4/1.7 PVR = 2.1 WU Ao sat = 96% PA sat = 38%, 39%  Assessment: 1. Cardiogenic shock with elevated filling pressures  Plan/Discussion:  IABP placed for support. Plan VAD Monday.   [name] MD 3:08 PM

## 2014-09-17 NOTE — Progress Notes (Signed)
[name] is sitting up in chair and says his breathing is improved after thoracentesis. He tells me that surgery is planned for Monday. I explained that I will continue to follow for support. He has no further questions/concerns at this time. They are very hopeful for improved QOL after VAD.    Vinie Sill, NP Palliative Medicine Team Pager # [phone] (M-F 8a-5p) Team Phone # [phone] (Nights/Weekends)

## 2014-09-17 NOTE — Progress Notes (Addendum)
ANTICOAGULATION CONSULT NOTE - Follow Up Consult  Pharmacy Consult for Heparin  Indication: Hx of VTE  No Known Allergies  Patient Measurements: Height: 5\' 8"  (172.7 cm) Weight: 188 lb 7.9 oz (85.5 kg) IBW/kg (Calculated) : 68.4  Vital Signs: Temp: 97.1 F (36.2 C) (03/18 1111) Temp Source: Axillary (03/18 1111) BP: 98/78 mmHg (03/18 1300) Pulse Rate: 110 (03/18 1200)  Labs:  Recent Labs  09/15/14 1000 09/15/14 1124 09/16/14 0359 09/16/14 1235 09/16/14 1700 09/17/14 0425 09/17/14 0500 09/17/14 1200  HGB 11.0*  --  10.9*  --   --  9.9*  --   --   HCT 35.8*  --  36.6*  --   --  32.8*  --   --   PLT 642*  --  586*  --   --  491*  --   --   LABPROT  --  22.0* 22.9*  --   --   --  19.5*  --   INR  --  1.90* 2.01*  --   --   --  1.64*  --   HEPARINUNFRC  --  0.41 0.88* 0.54  --   --  0.20* 0.48  CREATININE 0.93  --  1.30  --  1.17 1.19  --   --     Estimated Creatinine Clearance: 64.9 mL/min (by C-G formula based on Cr of 1.19).  Assessment: 68yom admitted with HF exacerbation - plan LVAD next week.  Recent PE use heparin as a bridge while off warfarin for LVAD.  INR 1.6, Heparin dip 1500 uts/hr HL 0.48 no bleeding.  CBC stable - heparin then off for cath lab - f/u for restart. Recent MI with DES in 12/15 - last dose clop 3/16 plan start tirofiban 3/19 am.  Goal of Therapy:  Heparin level 0.3-0.7 units/ml Monitor platelets by anticoagulation protocol: Yes   Plan:  -restart heparin drip at 1500 units/hr After cath lab -Daily CBC/HL -Monitor for bleeding -start 1/2 dose tirofiban 0.034mcg/kg/min in am   Bonnita Nasuti Pharm.D. CPP, BCPS Clinical Pharmacist [phone] 09/17/2014 2:35 PM    Addendum: Heparin level is 0.23 and therapeutic (goal 0.2-0.5 for IABP). Plan is for LVAD Mon. No bleeding noted.  Continue heparin drip at 1500 units/hr F/u am heparin level  [facility], Pharm.D., BCPS Clinical Pharmacist Pager: [phone] 09/17/2014 10:59 PM

## 2014-09-17 NOTE — Progress Notes (Signed)
ANTICOAGULATION CONSULT NOTE - Follow Up Consult  Pharmacy Consult for Heparin  Indication: Hx of VTE  No Known Allergies  Patient Measurements: Height: 5\' 8"  (172.7 cm) Weight: 188 lb 7.9 oz (85.5 kg) IBW/kg (Calculated) : 68.4  Vital Signs: Temp: 98.5 F (36.9 C) (03/17 2000) Temp Source: Oral (03/17 2000) BP: 88/54 mmHg (03/18 0400) Pulse Rate: 104 (03/18 0400)  Labs:  Recent Labs  09/15/14 1000 09/15/14 1124 09/16/14 0359 09/16/14 1235 09/16/14 1700 09/17/14 0425 09/17/14 0500  HGB 11.0*  --  10.9*  --   --  9.9*  --   HCT 35.8*  --  36.6*  --   --  32.8*  --   PLT 642*  --  586*  --   --  491*  --   LABPROT  --  22.0* 22.9*  --   --   --  19.5*  INR  --  1.90* 2.01*  --   --   --  1.64*  HEPARINUNFRC  --  0.41 0.88* 0.54  --   --  0.20*  CREATININE 0.93  --  1.30  --  1.17  --   --     Estimated Creatinine Clearance: 66.1 mL/min (by C-G formula based on Cr of 1.17).  Assessment: Sub-therapeutic heparin level, no issues per RN.   Goal of Therapy:  Heparin level 0.3-0.7 units/ml Monitor platelets by anticoagulation protocol: Yes   Plan:  -Increase heparin drip to 1500 units/hr -1200 HL -Daily CBC/HL -Monitor for bleeding  Narda Bonds 09/17/2014,5:04 AM

## 2014-09-17 NOTE — Progress Notes (Signed)
Pre-VAD Education Note:  VAD evaluation consent reviewed and signed by pt and wife on 09/15/14. Two VAD patients have met with the Scott [name] and his wife while inpatient and discussed living with the device, pros and cons to aid in decision making.    Initial VAD teaching completed with pt and primary caregiver, Scott [name] (wife). A second caregiver has reportedly been identified Scott [name] # [phone] (reliable neighbor)--requested that we bring him in to do training with VAD Coordinators re: the below.   Scott [name] and caregiver asked questions and had good interaction with VAD coordinator, although at first [name] was hesitant to handle equipment but improved as we got deeper into discussion; all questions answered regarding VAD implant, hospital stay, and what to expect when discharged home. Pt identified wife as primary caregiver. Explained need for 24/7 care when pt discharged home for at least 2 weeks as indicated by his condition; both pt and caregiver verbalized understanding of above.   Explained that LVAD can be implanted for two indications: 1. Bridge to transplant - used for patients who cannot safely wait for heart transplant.  2. Destination therapy - used for patients until end of life or recovery of heart function.  Scott [name] states he would be getting LVAD as destination therapy (until end of life) at this time.   VAD educational packet including "HM II Scott [name] Handbook", "HM II Left Ventricular Assist System" packet, and "[place] HM II Scott [name] Education" reviewed with pt and wife.  Equipment demonstration with HM II training loop included the following:  a) power module  b) system controller  c) universal Char[employer] fundraiser  d) battery clips  e) Batteries  f)  Perc lock  g) Percutaneous lead   Demonstrated:  a) changing power source on system controller from tethered (power module) to untethered (battery) mode  b) changing power source on system controller from  untethered (battery) to tethered (power module) mode  c) how to monitor battery life both on the system controller and on each individual battery  d) changing batteries   Educated Scott [name] and caregiver about the Power Module, Scott [name] Controller including display functions, brief overview of alarms (yellow vs red) and daily maintenance. Asking questions about wearable accessory options and I went over briefly some of the options that we can offer him to see what is most comfortable for him.   Stressed importance of performing home inspection and assuring that only three pronged grounded power outlets can be used for VAD equipment. Scott [name] confirms that his home has electrical outlets that will support VAD equipment when Scott [name] discharged home; master bedroom located on the main floor with a bathroom close-by.   Discussed the following lifestyle changes while living on support:  1. No driving for at least three months and then only if doctor gives permission to do so.  2. No tub baths while pump implanted, and shower only if doctor gives permission.  3. No swimming or submersion in water while implanted with pump.  4. No contact sports or engaging in jumping activities.  5. Always have a backup controller, charged spare batteries, and battery clips nearby at all times in case of emergency.  6. Call the doctor or hospital contact person if any change in how the pump sounds, feels, or works.  7. Plan to sleep only when connected to the power module.  8. Do not sleep on your stomach.  9. Keep a backup system controller, charged batteries, battery clips, and flashlight near you during  sleep in case of electrical power outage.  10. Exit site care including dressing changes, monitoring for infection, and importance of keeping percutaneous lead stabilized at all times.   Stressed importance of caregiver role with dressing changes in keeping with consistent wound care and providing the least risk for  infection of the drive line site.   Reviewed pictures of VAD drive line, site care, dressing changes, and drive line stabilization including securement attachment device and abdominal binder. Discussed with pt and family that they will be required to purchase dressing supplies as long as Scott [name] has the VAD in place and they will obtain the supplies through our clinic.   Reinforced the need for lifelong anticoagulation and the lifestyle changes necessary to be successful. He was on warfarin in the past and has a Coumadin Clinic through [facility] in [place] where they live close by.   Discussed with Dr. [name] this morning survivability through operation and we expanded on this to include discussion surrounding care in the OR, ICU setting while intubated and sedated and average length of stay for patients after this surgery. Discussed importance of pulmonary toilet after surgery and walking as much as he can to ensure timely discharge and optimize his recovery. He has concerns around intubation since he had a bad experience in the recent past and we discussed it at length.   Will continue to follow the Scott [name] through his pre- and post-operative course, identify discharge needs early and continue with the Scott [name] and caregiver education.   Total elapsed time: 90 minutes

## 2014-09-17 NOTE — Progress Notes (Signed)
Inpatient Diabetes Program Recommendations  AACE/ADA: New Consensus Statement on Inpatient Glycemic Control (2013)  Target Ranges:  Prepandial:   less than 140 mg/dL      Peak postprandial:   less than 180 mg/dL (1-2 hours)      Critically ill patients:  140 - 180 mg/dL    Results for MAYO, FAULK (MRN [id number]) as of 09/17/2014 08:29  Ref. Range 09/17/2014 04:25  Glucose Latest Range: 70-99 mg/dL 291 (H)   Diabetes history: DM 2 Outpatient Diabetes medications: None Current orders for Inpatient glycemic control: None  Inpatient Diabetes Program Recommendations Correction (SSI): Lab glucose 291 this am. Please consider placing patient on CBG's and Novolog 0-9 units (sensitive scale) ACHS.  Thanks,  Tama Headings RN, MSN, [facility] Inpatient Diabetes Coordinator Team Pager [phone]

## 2014-09-17 NOTE — Progress Notes (Signed)
Patient ID: Scott [name], male   DOB: Nov 13, 1945, 69 y.o.   MRN: [id number] Advanced Heart Failure Rounding Note   Subjective:    Underwent thoracentesis on 3/17 and started on levophed for low co-ox. Effusion was transudative/inflammatory.  Now on milrinone @ 0.25 mcg/levophed 10.  Weight stable. Co-ox 62%. CVP remains 12. Cr down 1.3 -> 1.2. On broad spectrum abx. INR 1/6 on heparin. CVP 15    Objective:   Weight Range:  Vital Signs:   Temp:  [97.1 F (36.2 C)-98.5 F (36.9 C)] 97.1 F (36.2 C) (03/18 0730) Pulse Rate:  [29-123] 110 (03/18 0730) Resp:  [19-37] 29 (03/18 0730) BP: (53-115)/(32-87) 101/87 mmHg (03/18 0730) SpO2:  [87 %-100 %] 100 % (03/18 0730) Weight:  [85.5 kg (188 lb 7.9 oz)] 85.5 kg (188 lb 7.9 oz) (03/18 0400) Last BM Date: 09/17/14  Weight change: Filed Weights   09/15/14 0300 09/16/14 0300 09/17/14 0400  Weight: 84.913 kg (187 lb 3.2 oz) 84.823 kg (187 lb) 85.5 kg (188 lb 7.9 oz)    Intake/Output:   Intake/Output Summary (Last 24 hours) at 09/17/14 0901 Last data filed at 09/17/14 0900  Gross per 24 hour  Intake 2667.8 ml  Output    675 ml  Net 1992.8 ml     Physical Exam: General: sitting in chair No resp difficulty HEENT: normal Neck: supple.  JVP ~ 15 .  Carotids 2+ bilaterally; no bruits. No lymphadenopathy or thryomegaly appreciated. Cor: PMI laterally displaced. Tachy regular. +s3 Lungs: clear Abdomen: soft, nontender, nondistended. No hepatosplenomegaly. No bruits or masses. Good bowel sounds. Extremities: no cyanosis, clubbing, rash, no edema. Cool   RUE PICC Neuro: alert & orientedx3, cranial nerves grossly intact. Moves all 4 extremities w/o difficulty. Affect pleasant.  Telemetry: ST 100-120   Labs: Basic Metabolic Panel:  Recent Labs Lab 09/10/14 1000  09/14/14 0410 09/15/14 1000 09/16/14 0359 09/16/14 1700 09/17/14 0425  NA 142  < > 139 135 137 133* 129*  K 5.5*  < > 4.4 4.4 5.0 4.3 4.3  CL 103  < > 101 99  101 97 94*  CO2 30  < > 34* 28 27 28 29   GLUCOSE 127*  < > 139* 256* 157* 150* 291*  BUN 28*  < > 11 11 16 18 18   CREATININE 1.35  < > 0.95 0.93 1.30 1.17 1.19  CALCIUM 8.6  < > 8.4 8.6 8.8 8.3* 8.5  MG 2.5  --   --   --   --   --   --   < > = values in this interval not displayed.  Liver Function Tests:  Recent Labs Lab 09/10/14 1000 09/16/14 1700  AST 83* 46*  ALT 118* 62*  ALKPHOS 322* 226*  BILITOT 0.8 1.2  PROT 7.3 7.7  ALBUMIN 2.8* 3.0*   No results for input(s): LIPASE, AMYLASE in the last 168 hours. No results for input(s): AMMONIA in the last 168 hours.  CBC:  Recent Labs Lab 09/10/14 1000 09/13/14 0547 09/15/14 1000 09/16/14 0359 09/17/14 0425  WBC 13.2* 12.3* 12.1* 13.3* 12.6*  HGB 10.7* 10.9* 11.0* 10.9* 9.9*  HCT 35.1* 35.8* 35.8* 36.6* 32.8*  MCV 92.6 91.3 91.6 94.1 93.7  PLT 1013* 743* 642* 586* 491*    Cardiac Enzymes: No results for input(s): CKTOTAL, CKMB, CKMBINDEX, TROPONINI in the last 168 hours.  BNP: BNP (last 3 results)  Recent Labs  07/20/14 1053 08/27/14 0253 09/10/14 1000  BNP 773.6* 1971.6* 2173.4*  ProBNP (last 3 results) No results for input(s): PROBNP in the last 8760 hours.    Other results:   Imaging: Ct Abdomen Wo Contrast  09/16/2014   CLINICAL DATA:  Evaluate for LVAD placement. Evaluate for LVAD placement. Prior myocardial infarction and coronary artery disease.  EXAM: CT CHEST AND ABDOMEN WITHOUT CONTRAST  TECHNIQUE: Multidetector CT imaging of the chest and abdomen was performed following the standard protocol without intravenous contrast.  COMPARISON:  Chest CT 08/22/2014  FINDINGS: CT CHEST FINDINGS  Mediastinum/Nodes: Central venous catheter tip terminates at the superior cavoatrial junction. Heart remains enlarged. There is peripheral low attenuation along the left ventricular apex. Coronary arterial vascular calcifications. Multiple prominent mediastinal lymph nodes are demonstrated. Additionally there is  an enlarged subcarinal lymph node measuring 2 cm.  Lungs/Pleura: The central airways are patent. Interval increase in size of now moderate layering right pleural effusion. Trace left pleural effusion. Increased consolidation involving the right lower lobe and right middle lobes. Patchy ground-glass and consolidative opacities within the left upper and left lower lobes. No pneumothorax. 3 mm calcified left lower lobe pulmonary nodule.  Musculoskeletal: Multilevel degenerative changes. No aggressive or acute appearing osseous lesions.  CT ABDOMEN FINDINGS  Hepatobiliary: Liver is diffusely low in attenuation compatible with hepatic steatosis. Cholelithiasis. No gallbladder wall thickening.  Pancreas: Unremarkable  Spleen: Multi lobular in appearance.  Adrenals/Urinary Tract: Normal bilateral adrenal glands. Bilateral nonobstructing nephrolithiasis measuring up to 7 mm within the interpolar region of the left kidney and 6 mm within the inferior pole of the right kidney.  Stomach/Bowel: Visualized small bowel is unremarkable. Visualize large bowel is unremarkable.  Vascular/Lymphatic: Inferior vena cava filter in place. Normal caliber abdominal aorta. No retroperitoneal lymphadenopathy.  Other: None  Musculoskeletal: Degenerative changes of the visualized lumbar spine without aggressive or acute appearing osseous lesions.  IMPRESSION: 1. Interval increase in size of now moderate right and small left pleural effusions. 2. Interval development as well as increase in size of consolidative opacities involving the right middle and right lower lobes as well as patchy ground-glass and consolidation within the left upper and left lower lobes. Findings, particularly within the right lung may represent infection, infarction, edema and/or atelectasis. 3. Persistent irregularity of the left ventricular apex potentially representing mural thrombus. 4. Nonspecific enlarged subcarinal lymph node measuring approximately 2 cm. This may  potentially be reactive in etiology. Consider evaluation with laboratory analysis and attention on follow-up imaging.   Electronically Signed   By: Lovey Newcomer M.D.   On: 09/16/2014 08:08   Dg Orthopantogram  09/16/2014   CLINICAL DATA:  Preoperative examination prior to placement of a left ventricular assist device.  EXAM: ORTHOPANTOGRAM/PANORAMIC  COMPARISON:  None.  FINDINGS: The mandible appears intact where visualized. There is no lytic nor blastic lesion. The teeth exhibit no acute abnormalities.  IMPRESSION: No acute abnormality of the mandible is demonstrated on this Panorex study.   Electronically Signed   By: David  Martinique   On: 09/16/2014 07:55   Ct Chest Wo Contrast  09/16/2014   CLINICAL DATA:  Evaluate for LVAD placement. Evaluate for LVAD placement. Prior myocardial infarction and coronary artery disease.  EXAM: CT CHEST AND ABDOMEN WITHOUT CONTRAST  TECHNIQUE: Multidetector CT imaging of the chest and abdomen was performed following the standard protocol without intravenous contrast.  COMPARISON:  Chest CT 08/22/2014  FINDINGS: CT CHEST FINDINGS  Mediastinum/Nodes: Central venous catheter tip terminates at the superior cavoatrial junction. Heart remains enlarged. There is peripheral low attenuation along the left  ventricular apex. Coronary arterial vascular calcifications. Multiple prominent mediastinal lymph nodes are demonstrated. Additionally there is an enlarged subcarinal lymph node measuring 2 cm.  Lungs/Pleura: The central airways are patent. Interval increase in size of now moderate layering right pleural effusion. Trace left pleural effusion. Increased consolidation involving the right lower lobe and right middle lobes. Patchy ground-glass and consolidative opacities within the left upper and left lower lobes. No pneumothorax. 3 mm calcified left lower lobe pulmonary nodule.  Musculoskeletal: Multilevel degenerative changes. No aggressive or acute appearing osseous lesions.  CT ABDOMEN  FINDINGS  Hepatobiliary: Liver is diffusely low in attenuation compatible with hepatic steatosis. Cholelithiasis. No gallbladder wall thickening.  Pancreas: Unremarkable  Spleen: Multi lobular in appearance.  Adrenals/Urinary Tract: Normal bilateral adrenal glands. Bilateral nonobstructing nephrolithiasis measuring up to 7 mm within the interpolar region of the left kidney and 6 mm within the inferior pole of the right kidney.  Stomach/Bowel: Visualized small bowel is unremarkable. Visualize large bowel is unremarkable.  Vascular/Lymphatic: Inferior vena cava filter in place. Normal caliber abdominal aorta. No retroperitoneal lymphadenopathy.  Other: None  Musculoskeletal: Degenerative changes of the visualized lumbar spine without aggressive or acute appearing osseous lesions.  IMPRESSION: 1. Interval increase in size of now moderate right and small left pleural effusions. 2. Interval development as well as increase in size of consolidative opacities involving the right middle and right lower lobes as well as patchy ground-glass and consolidation within the left upper and left lower lobes. Findings, particularly within the right lung may represent infection, infarction, edema and/or atelectasis. 3. Persistent irregularity of the left ventricular apex potentially representing mural thrombus. 4. Nonspecific enlarged subcarinal lymph node measuring approximately 2 cm. This may potentially be reactive in etiology. Consider evaluation with laboratory analysis and attention on follow-up imaging.   Electronically Signed   By: Lovey Newcomer M.D.   On: 09/16/2014 08:08   Dg Chest Port 1 View  09/16/2014   CLINICAL DATA:  Status post thoracentesis  EXAM: PORTABLE CHEST - 1 VIEW  COMPARISON:  CT from earlier in the same day  FINDINGS: Cardiac shadow remains enlarged. A right-sided PICC line is again noted in the upper right atrium. The left lung remains clear. A right-sided thoracentesis has been performed. No pneumothorax  is noted. Right basilar infiltrate in the right middle and lower lobes is again identified and stable.  IMPRESSION: No pneumothorax following thoracentesis on the right. Persistent right middle and lower lobe infiltrate is seen.   Electronically Signed   By: Inez Catalina M.D.   On: 09/16/2014 16:41     Medications:     Scheduled Medications: . sodium chloride   Intravenous Once  . cefTAZidime (FORTAZ)  IV  2 g Intravenous 3 times per day  . digoxin  0.125 mg Oral Daily  . furosemide  40 mg Oral Daily  . multivitamin with minerals  1 tablet Oral Daily  . rosuvastatin  10 mg Oral q1800  . sodium chloride  10-40 mL Intracatheter Q12H  . sodium chloride  3 mL Intravenous Q12H  . spironolactone  12.5 mg Oral Daily  . vancomycin  1,000 mg Intravenous Q12H    Infusions: . heparin 1,500 Units/hr (09/17/14 0513)  . milrinone 0.25 mcg/kg/min (09/17/14 0718)  . norepinephrine (LEVOPHED) Adult infusion 10 mcg/min (09/17/14 0718)    PRN Medications: sodium chloride, acetaminophen, ondansetron (ZOFRAN) IV, sodium chloride, sodium chloride, traMADol   Assessment:   1. A/C systolic HF class IV with cardiogenic shock- EF 15% with moderate  RV dysfunction 3/16 echo.  2. CAD s/p Anterior STEMI on 06/12/14 with stenting of LAD- on coumadin + plavix, statin.   3. 06/12/14 VT in setting of STEMI 4. Ankylosing spondylitis. 5. Bilateral PE with R DVT- S/P IVC filter 08/27/2014--->coumadin + plavix  6. Asystole noted from Life Vest 7. LV thrombus 8. Full Code 9. Hemoptysis 10. A/C renal failure 11. Pleural effusion, transudative   Plan/Discussion:    He remains very tenuous despite milrinone and levophed. Volume status up, cool on exam and quite tachycardic. He is tentatively slated for VAD on Monday. Will resume IV diuresis. Continue inotropes and plan Swan +/- IABP (or Impella) today. He is off warfarin and plavix. Continue heparin. Start Tirofiban tomorrow for recent LAD stent 12/15.    Pleural effusion tapped by CCM on 3/17. On broad spectrum abx for infiltrates.   The patient is critically ill with multiple organ systems failure and requires high complexity decision making for assessment and support, frequent evaluation and titration of therapies, application of advanced monitoring technologies and extensive interpretation of multiple databases.   Critical Care Time devoted to patient care services described in this note is 35 Minutes.    Ianmichael Bensimhon,MD  9:01 AM  09/17/2014 Advanced Heart Failure Team Pager [phone] (M-F; 7a - 4p)  Please contact [facility] for night-coverage after hours (4p -7a ) and weekends on [email or web address]

## 2014-09-17 NOTE — Consult Note (Signed)
Name: Scott [name] MRN: [id number] DOB: 1945-12-07    ADMISSION DATE:  09/10/2014 CONSULTATION DATE:  09/17/2014  REFERRING MD :  Bensimhon  CHIEF COMPLAINT:  SOB  BRIEF PATIENT DESCRIPTION: 69 y.o. M with extensive cardiac hx as well as recent PE / DVT, admitted for SOB due to heart failure, cardiogenic, shock, PE.  Thought to be good candidate for VAD (to be evaluated by VAD team 3/17).  Found to have mod - large R pleural effusion, PCCM consulted for thoracentesis as part of optimization prior to VAD.  SIGNIFICANT EVENTS  3/11 - admit 3/17 - Dr. Everlene Balls to see for VAD, PCCM consulted for right pleural effusion > optimization prior to VAD.  STUDIES:  LE dopplers from OSH: Right leg DVT  CT Chest from OSH: bilateral diffuse PEs w/o overt signs of right heart strain ECHO 12/12 >>EF 20%; mid Lventricle and apex is akinetic ECHO 12/21 >> Echo showed EF <20%, severely decreased global systolic LV function, dilated CM, moderate MR/TR   HISTORY OF PRESENT ILLNESS:  Scott [name] is a 69 y.o. M with PMH as outlined below.  He had recent admission to [facility] (08/26/14 - 09/02/14) for PE / RLE DVT for which he was treated with heparin gtt and coumadin bridge, as well as placement of IVC filter. He presented to HF clinic 3/11 and was doing poorly overall, complained of fatigue, increased LE edema, ongoing DOE.  Admitted by cardiology for further management.  BP remained soft despite milrinone, co-ox dropping.  Felt to be candidate for VAD.  PCCM consulted for mod - large right pleural effusion and possible thoracentesis in order to optimize for VAD.  SUBJECTIVE: No events overnight, concerned about cardiac condition, remains on milrinone and levophed  VITAL SIGNS: Temp:  [97.1 F (36.2 C)-98.5 F (36.9 C)] 97.1 F (36.2 C) (03/18 0730) Pulse Rate:  [29-123] 118 (03/18 0950) Resp:  [19-37] 28 (03/18 0800) BP: (53-115)/(32-87) 100/71 mmHg (03/18 0900) SpO2:  [87 %-100 %] 96 % (03/18  0900) Weight:  [85.5 kg (188 lb 7.9 oz)] 85.5 kg (188 lb 7.9 oz) (03/18 0400)  PHYSICAL EXAMINATION: General: Chronically ill appearing male, sitting in bed, in NAD. Neuro: A&O x 3, non-focal.  HEENT: [place]/AT. PERRL, sclerae anicteric. Cardiovascular: RRR, no M/R/G.  Lungs: Respirations even and unlabored.  Diminished BS in bases, R > L.  Dullness R base. Abdomen: BS x 4, soft, NT/ND.  Musculoskeletal: No gross deformities, no edema.  Skin: Intact, warm, no rashes.  Recent Labs Lab 09/16/14 0359 09/16/14 1700 09/17/14 0425  NA 137 133* 129*  K 5.0 4.3 4.3  CL 101 97 94*  CO2 27 28 29   BUN 16 18 18   CREATININE 1.30 1.17 1.19  GLUCOSE 157* 150* 291*    Recent Labs Lab 09/15/14 1000 09/16/14 0359 09/17/14 0425  HGB 11.0* 10.9* 9.9*  HCT 35.8* 36.6* 32.8*  WBC 12.1* 13.3* 12.6*  PLT 642* 586* 491*   Ct Abdomen Wo Contrast  09/16/2014   CLINICAL DATA:  Evaluate for LVAD placement. Evaluate for LVAD placement. Prior myocardial infarction and coronary artery disease.  EXAM: CT CHEST AND ABDOMEN WITHOUT CONTRAST  TECHNIQUE: Multidetector CT imaging of the chest and abdomen was performed following the standard protocol without intravenous contrast.  COMPARISON:  Chest CT 08/22/2014  FINDINGS: CT CHEST FINDINGS  Mediastinum/Nodes: Central venous catheter tip terminates at the superior cavoatrial junction. Heart remains enlarged. There is peripheral low attenuation along the left ventricular apex. Coronary arterial vascular calcifications. Multiple  prominent mediastinal lymph nodes are demonstrated. Additionally there is an enlarged subcarinal lymph node measuring 2 cm.  Lungs/Pleura: The central airways are patent. Interval increase in size of now moderate layering right pleural effusion. Trace left pleural effusion. Increased consolidation involving the right lower lobe and right middle lobes. Patchy ground-glass and consolidative opacities within the left upper and left lower lobes. No  pneumothorax. 3 mm calcified left lower lobe pulmonary nodule.  Musculoskeletal: Multilevel degenerative changes. No aggressive or acute appearing osseous lesions.  CT ABDOMEN FINDINGS  Hepatobiliary: Liver is diffusely low in attenuation compatible with hepatic steatosis. Cholelithiasis. No gallbladder wall thickening.  Pancreas: Unremarkable  Spleen: Multi lobular in appearance.  Adrenals/Urinary Tract: Normal bilateral adrenal glands. Bilateral nonobstructing nephrolithiasis measuring up to 7 mm within the interpolar region of the left kidney and 6 mm within the inferior pole of the right kidney.  Stomach/Bowel: Visualized small bowel is unremarkable. Visualize large bowel is unremarkable.  Vascular/Lymphatic: Inferior vena cava filter in place. Normal caliber abdominal aorta. No retroperitoneal lymphadenopathy.  Other: None  Musculoskeletal: Degenerative changes of the visualized lumbar spine without aggressive or acute appearing osseous lesions.  IMPRESSION: 1. Interval increase in size of now moderate right and small left pleural effusions. 2. Interval development as well as increase in size of consolidative opacities involving the right middle and right lower lobes as well as patchy ground-glass and consolidation within the left upper and left lower lobes. Findings, particularly within the right lung may represent infection, infarction, edema and/or atelectasis. 3. Persistent irregularity of the left ventricular apex potentially representing mural thrombus. 4. Nonspecific enlarged subcarinal lymph node measuring approximately 2 cm. This may potentially be reactive in etiology. Consider evaluation with laboratory analysis and attention on follow-up imaging.   Electronically Signed   By: Lovey Newcomer M.D.   On: 09/16/2014 08:08   Dg Orthopantogram  09/16/2014   CLINICAL DATA:  Preoperative examination prior to placement of a left ventricular assist device.  EXAM: ORTHOPANTOGRAM/PANORAMIC  COMPARISON:  None.   FINDINGS: The mandible appears intact where visualized. There is no lytic nor blastic lesion. The teeth exhibit no acute abnormalities.  IMPRESSION: No acute abnormality of the mandible is demonstrated on this Panorex study.   Electronically Signed   By: David  Martinique   On: 09/16/2014 07:55   Ct Chest Wo Contrast  09/16/2014   CLINICAL DATA:  Evaluate for LVAD placement. Evaluate for LVAD placement. Prior myocardial infarction and coronary artery disease.  EXAM: CT CHEST AND ABDOMEN WITHOUT CONTRAST  TECHNIQUE: Multidetector CT imaging of the chest and abdomen was performed following the standard protocol without intravenous contrast.  COMPARISON:  Chest CT 08/22/2014  FINDINGS: CT CHEST FINDINGS  Mediastinum/Nodes: Central venous catheter tip terminates at the superior cavoatrial junction. Heart remains enlarged. There is peripheral low attenuation along the left ventricular apex. Coronary arterial vascular calcifications. Multiple prominent mediastinal lymph nodes are demonstrated. Additionally there is an enlarged subcarinal lymph node measuring 2 cm.  Lungs/Pleura: The central airways are patent. Interval increase in size of now moderate layering right pleural effusion. Trace left pleural effusion. Increased consolidation involving the right lower lobe and right middle lobes. Patchy ground-glass and consolidative opacities within the left upper and left lower lobes. No pneumothorax. 3 mm calcified left lower lobe pulmonary nodule.  Musculoskeletal: Multilevel degenerative changes. No aggressive or acute appearing osseous lesions.  CT ABDOMEN FINDINGS  Hepatobiliary: Liver is diffusely low in attenuation compatible with hepatic steatosis. Cholelithiasis. No gallbladder wall thickening.  Pancreas: Unremarkable  Spleen: Multi lobular in appearance.  Adrenals/Urinary Tract: Normal bilateral adrenal glands. Bilateral nonobstructing nephrolithiasis measuring up to 7 mm within the interpolar region of the left  kidney and 6 mm within the inferior pole of the right kidney.  Stomach/Bowel: Visualized small bowel is unremarkable. Visualize large bowel is unremarkable.  Vascular/Lymphatic: Inferior vena cava filter in place. Normal caliber abdominal aorta. No retroperitoneal lymphadenopathy.  Other: None  Musculoskeletal: Degenerative changes of the visualized lumbar spine without aggressive or acute appearing osseous lesions.  IMPRESSION: 1. Interval increase in size of now moderate right and small left pleural effusions. 2. Interval development as well as increase in size of consolidative opacities involving the right middle and right lower lobes as well as patchy ground-glass and consolidation within the left upper and left lower lobes. Findings, particularly within the right lung may represent infection, infarction, edema and/or atelectasis. 3. Persistent irregularity of the left ventricular apex potentially representing mural thrombus. 4. Nonspecific enlarged subcarinal lymph node measuring approximately 2 cm. This may potentially be reactive in etiology. Consider evaluation with laboratory analysis and attention on follow-up imaging.   Electronically Signed   By: Lovey Newcomer M.D.   On: 09/16/2014 08:08   Dg Chest Port 1 View  09/16/2014   CLINICAL DATA:  Status post thoracentesis  EXAM: PORTABLE CHEST - 1 VIEW  COMPARISON:  CT from earlier in the same day  FINDINGS: Cardiac shadow remains enlarged. A right-sided PICC line is again noted in the upper right atrium. The left lung remains clear. A right-sided thoracentesis has been performed. No pneumothorax is noted. Right basilar infiltrate in the right middle and lower lobes is again identified and stable.  IMPRESSION: No pneumothorax following thoracentesis on the right. Persistent right middle and lower lobe infiltrate is seen.   Electronically Signed   By: Inez Catalina M.D.   On: 09/16/2014 16:41   Right Pleural Space 3/17:     ASSESSMENT / PLAN:  Known PE  / RLE DVT - on heparin (coumadin as outpatient) Recs: Continue hepartin gtt in lieu of outpatient Coumadin + Plavix (hold for thoracentesis, see below).  Bilateral pleural effusions R > L - with associated compressive atx on right Pleural effusion exudative, likely parapneumonic effusion given WBC ?pulmonary infarct. Recs: Continue abx as ordered. Continue diuresis as able. Incentive spirometry. Continue anticoagulation.  Hypotension: cardiogenic shock. Milrinone and levophed as ordered. PAC today. If PAC shows poor cardiac output then place IABP. LVAD on Monday. Monitor CVP.  PNA vs pulmonary infarct with parapneumonic effusion: Vanc/ceftaz. F/U on culture. No chest tube for now. Monitor with daily CXR.  Renal function intact, hold diureses.  Heme: DVTs documented. Continue heparin Follow coags.  PCCM will continue to follow.  The patient is critically ill with multiple organ systems failure and requires high complexity decision making for assessment and support, frequent evaluation and titration of therapies, application of advanced monitoring technologies and extensive interpretation of multiple databases.   Critical Care Time devoted to patient care services described in this note is  35  Minutes. This time reflects time of care of this signee Dr Jennet Maduro. This critical care time does not reflect procedure time, or teaching time or supervisory time of PA/NP/Med student/Med Resident etc but could involve care discussion time.  Rush Farmer, M.D. [facility] Pulmonary/Critical Care Medicine. Pager: [phone]. After hours pager: [phone].  09/17/2014 9:55 AM

## 2014-09-17 NOTE — Progress Notes (Signed)
COOX from 2130 after initiating Levo at 5 mcg was 71.5%. COOX this AM 3/18 0400 was 44.6% with Levo continuously running at 5 mcg since 2100.  RT will fix times in computer when dayshift supervisor arrives.    Nikki Dom

## 2014-09-18 ENCOUNTER — Encounter (HOSPITAL_COMMUNITY): Payer: Self-pay | Admitting: Internal Medicine

## 2014-09-18 DIAGNOSIS — I82401 Acute embolism and thrombosis of unspecified deep veins of right lower extremity: Secondary | ICD-10-CM

## 2014-09-18 LAB — BASIC METABOLIC PANEL
Anion gap: 7 (ref 5–15)
BUN: 18 mg/dL (ref 6–23)
CO2: 35 mmol/L — ABNORMAL HIGH (ref 19–32)
Calcium: 8.4 mg/dL (ref 8.4–10.5)
Chloride: 92 mmol/L — ABNORMAL LOW (ref 96–112)
Creatinine, Ser: 1.06 mg/dL (ref 0.50–1.35)
GFR calc Af Amer: 83 mL/min — ABNORMAL LOW (ref >90)
GFR calc non Af Amer: 71 mL/min — ABNORMAL LOW (ref >90)
Glucose, Bld: 138 mg/dL — ABNORMAL HIGH (ref 70–99)
Potassium: 3.8 mmol/L (ref 3.5–5.1)
Sodium: 134 mmol/L — ABNORMAL LOW (ref 135–145)

## 2014-09-18 LAB — CBC
HCT: 32.1 % — ABNORMAL LOW (ref 39.0–52.0)
HEMATOCRIT: 27.2 % — AB (ref 39.0–52.0)
HEMATOCRIT: 33.3 % — AB (ref 39.0–52.0)
Hemoglobin: 8.3 g/dL — ABNORMAL LOW (ref 13.0–17.0)
Hemoglobin: 10.1 g/dL — ABNORMAL LOW (ref 13.0–17.0)
Hemoglobin: 9.7 g/dL — ABNORMAL LOW (ref 13.0–17.0)
MCH: 28.4 pg (ref 26.0–34.0)
MCH: 28.4 pg (ref 26.0–34.0)
MCH: 27.8 pg (ref 26.0–34.0)
MCHC: 30.5 g/dL (ref 30.0–36.0)
MCHC: 30.3 g/dL (ref 30.0–36.0)
MCHC: 30.2 g/dL (ref 30.0–36.0)
MCV: 93.2 fL (ref 78.0–100.0)
MCV: 93.5 fL (ref 78.0–100.0)
MCV: 92 fL (ref 78.0–100.0)
PLATELETS: 482 10*3/uL — AB (ref 150–400)
PLATELETS: 406 10*3/uL — AB (ref 150–400)
Platelets: 433 10*3/uL — ABNORMAL HIGH (ref 150–400)
RBC: 2.92 MIL/uL — ABNORMAL LOW (ref 4.22–5.81)
RBC: 3.56 MIL/uL — ABNORMAL LOW (ref 4.22–5.81)
RBC: 3.49 MIL/uL — ABNORMAL LOW (ref 4.22–5.81)
RDW: 17.4 % — ABNORMAL HIGH (ref 11.5–15.5)
RDW: 17.3 % — ABNORMAL HIGH (ref 11.5–15.5)
RDW: 17.4 % — AB (ref 11.5–15.5)
WBC: 15.1 10*3/uL — ABNORMAL HIGH (ref 4.0–10.5)
WBC: 14 10*3/uL — AB (ref 4.0–10.5)
WBC: 13.3 10*3/uL — AB (ref 4.0–10.5)

## 2014-09-18 LAB — CARBOXYHEMOGLOBIN
Carboxyhemoglobin: 1 % (ref 0.5–1.5)
Methemoglobin: 0.8 % (ref 0.0–1.5)
O2 Saturation: 66.8 %
TOTAL HEMOGLOBIN: 9.4 g/dL — AB (ref 13.5–18.0)

## 2014-09-18 LAB — PROTIME-INR
INR: 1.59 — ABNORMAL HIGH (ref 0.00–1.49)
PROTHROMBIN TIME: 19.1 s — AB (ref 11.6–15.2)

## 2014-09-18 LAB — HEPARIN LEVEL (UNFRACTIONATED)
Heparin Unfractionated: 0.34 IU/mL (ref 0.30–0.70)
Heparin Unfractionated: 0.26 IU/mL — ABNORMAL LOW (ref 0.30–0.70)
Heparin Unfractionated: 0.29 IU/mL — ABNORMAL LOW (ref 0.30–0.70)

## 2014-09-18 LAB — PLATELET INHIBITION P2Y12: PLATELET FUNCTION P2Y12: 175 [PRU] — AB (ref 194–418)

## 2014-09-18 LAB — PROCALCITONIN: Procalcitonin: 0.18 ng/mL

## 2014-09-18 MED ORDER — TIROFIBAN HCL IV 5 MG/100ML
0.0750 ug/kg/min | INTRAVENOUS | Status: AC
  Administered 2014-09-18 – 2014-09-19 (×2): 0.075 ug/kg/min via INTRAVENOUS
  Filled 2014-09-18 (×3): qty 100

## 2014-09-18 MED ORDER — FUROSEMIDE 10 MG/ML IJ SOLN
40.0000 mg | Freq: Every day | INTRAMUSCULAR | Status: DC
Start: 2014-09-19 — End: 2014-09-19
  Filled 2014-09-18: qty 4

## 2014-09-18 NOTE — Progress Notes (Signed)
Patient ID: Scott [name], male   DOB: [birth year], 69 y.o.   MRN: [id number] Advanced Heart Failure Rounding Note   Subjective:    -Thoracentesis 3/17. Levophed add to milrinone for low co-ox. Effusion was transudative/inflammatory. Started broad spectrum abx -3/18 recurrent low-output Swan put back in and IABP placed.  Remains on milrinone @ 0.25 mcg/levophed 10. IABP 1:1. Feels better. Co-ox up  to 67%. Creatinine improved. Diuresing well. Weight down 7 pounds.  LE u/s: DVT involving the right femoral vein and right peroneal vein.  Hgb 9.9 -> 8.3. WBC 12.6 -> 15.1 P2Y12  70 -> 175  Swan #s done personally at bedside: CVP 9 PAP 55/23  PCWP 19 Fick CO/CI 4.6/2.3 SVR 398  Objective:   Weight Range:  Vital Signs:   Temp:  [97.1 F (36.2 C)-99.7 F (37.6 C)] 98.6 F (37 C) (03/19 0812) Pulse Rate:  [100-118] 106 (03/19 0812) Resp:  [17-32] 19 (03/19 0812) BP: (83-111)/(31-78) 104/46 mmHg (03/19 0800) SpO2:  [91 %-100 %] 99 % (03/19 0812) Weight:  [82.1 kg (181 lb)-85.5 kg (188 lb 7.9 oz)] 82.1 kg (181 lb) (03/19 0500) Last BM Date: 09/17/14  Weight change: Filed Weights   09/17/14 0400 09/17/14 1152 09/18/14 0500  Weight: 85.5 kg (188 lb 7.9 oz) 85.5 kg (188 lb 7.9 oz) 82.1 kg (181 lb)    Intake/Output:   Intake/Output Summary (Last 24 hours) at 09/18/14 0737 Last data filed at 09/18/14 0700  Gross per 24 hour  Intake 1900.3 ml  Output   5025 ml  Net -3124.7 ml     Physical Exam: General: lying in bed No resp difficulty HEENT: normal Neck: supple.  JVP ~ 8-10 .  Carotids 2+ bilaterally; no bruits. No lymphadenopathy or thryomegaly appreciated. Cor: PMI laterally displaced. Tachy regular. +s3 Lungs: clear Abdomen: soft, nontender, nondistended. No hepatosplenomegaly. No bruits or masses. Good bowel sounds. Extremities: no cyanosis, clubbing, rash, no edema  RFA IABP ok.   RUE PICC Neuro: alert & orientedx3, cranial nerves grossly intact. Moves all 4  extremities w/o difficulty. Affect pleasant.  Telemetry: ST 100-120   Labs: Basic Metabolic Panel:  Recent Labs Lab 09/15/14 1000 09/16/14 0359 09/16/14 1700 09/17/14 0425 09/18/14 0400  NA 135 137 133* 129* 134*  K 4.4 5.0 4.3 4.3 3.8  CL 99 101 97 94* 92*  CO2 28 27 28 29  35*  GLUCOSE 256* 157* 150* 291* 138*  BUN 11 16 18 18 18   CREATININE 0.93 1.30 1.17 1.19 1.06  CALCIUM 8.6 8.8 8.3* 8.5 8.4    Liver Function Tests:  Recent Labs Lab 09/16/14 1700  AST 46*  ALT 62*  ALKPHOS 226*  BILITOT 1.2  PROT 7.7  ALBUMIN 3.0*   No results for input(s): LIPASE, AMYLASE in the last 168 hours. No results for input(s): AMMONIA in the last 168 hours.  CBC:  Recent Labs Lab 09/13/14 0547 09/15/14 1000 09/16/14 0359 09/17/14 0425 09/18/14 0400  WBC 12.3* 12.1* 13.3* 12.6* 15.1*  HGB 10.9* 11.0* 10.9* 9.9* 8.3*  HCT 35.8* 35.8* 36.6* 32.8* 27.2*  MCV 91.3 91.6 94.1 93.7 93.2  PLT 743* 642* 586* 491* 482*    Cardiac Enzymes: No results for input(s): CKTOTAL, CKMB, CKMBINDEX, TROPONINI in the last 168 hours.  BNP: BNP (last 3 results)  Recent Labs  07/20/14 1053 08/27/14 0253 09/10/14 1000  BNP 773.6* 1971.6* 2173.4*    ProBNP (last 3 results) No results for input(s): PROBNP in the last 8760 hours.    Other  results:   Imaging: Dg Chest Port 1 View  09/16/2014   CLINICAL DATA:  Status post thoracentesis  EXAM: PORTABLE CHEST - 1 VIEW  COMPARISON:  CT from earlier in the same day  FINDINGS: Cardiac shadow remains enlarged. A right-sided PICC line is again noted in the upper right atrium. The left lung remains clear. A right-sided thoracentesis has been performed. No pneumothorax is noted. Right basilar infiltrate in the right middle and lower lobes is again identified and stable.  IMPRESSION: No pneumothorax following thoracentesis on the right. Persistent right middle and lower lobe infiltrate is seen.   Electronically Signed   By: Inez Catalina M.D.   On:  09/16/2014 16:41     Medications:     Scheduled Medications: . sodium chloride   Intravenous Once  . cefTAZidime (FORTAZ)  IV  2 g Intravenous 3 times per day  . digoxin  0.125 mg Oral Daily  . furosemide  80 mg Intravenous BID  . multivitamin with minerals  1 tablet Oral Daily  . rosuvastatin  10 mg Oral q1800  . sodium chloride  10-40 mL Intracatheter Q12H  . sodium chloride  3 mL Intravenous Q12H  . sodium chloride  3 mL Intravenous Q12H  . spironolactone  12.5 mg Oral Daily  . vancomycin  1,000 mg Intravenous Q12H    Infusions: . heparin 1,500 Units/hr (09/18/14 0700)  . milrinone 0.25 mcg/kg/min (09/18/14 0700)  . norepinephrine (LEVOPHED) Adult infusion 10 mcg/min (09/18/14 0700)    PRN Medications: sodium chloride, sodium chloride, acetaminophen, ondansetron (ZOFRAN) IV, sodium chloride, sodium chloride, sodium chloride, traMADol   Assessment:   1. A/C systolic HF class IV with cardiogenic shock- EF 15% with moderate RV dysfunction 3/16 echo.  2. CAD s/p Anterior STEMI on 06/12/14 with stenting of LAD- on coumadin + plavix, statin.   3. 06/12/14 VT in setting of STEMI 4. Ankylosing spondylitis. 5. Bilateral PE with R DVT- S/P IVC filter 08/27/2014--->coumadin 6. Asystole noted from Life Vest 7. LV thrombus 8. Full Code 9. Hemoptysis, resolved 10. A/C renal failure, resolved 11. Pleural effusion, transudative   Plan/Discussion:    Much improved with IABP support. Will continue IABP and pressors. Volume status much improved. Renal function normalized. Will hold lasix tonight and switch to lasix 40 IV daily tomorrow. Can change as needed. P2Y12 coming up quickly. Add tirofiban later today. Continue heparin. High risk-VAD early next week.   Has residual RLE DVT. IVC filter in place. Continue heparin.   Pleural effusion tapped by CCM on 3/17. On broad spectrum abx for infiltrates. WBC climbing. Will repeat CBC this afternoon. If WBC climbing will add fungal  coverage.   With dropping hgb will repeat CBC and check Type & Screen   The patient is critically ill with multiple organ systems failure and requires high complexity decision making for assessment and support, frequent evaluation and titration of therapies, application of advanced monitoring technologies and extensive interpretation of multiple databases.   Critical Care Time devoted to patient care services described in this note is 45 Minutes.    Kaikoa Arzu Mcgaughey,MD  9:22 AM  09/18/2014 Advanced Heart Failure Team Pager [phone] (M-F; 7a - 4p)  Please contact [facility] for night-coverage after hours (4p -7a ) and weekends on [email or web address]

## 2014-09-18 NOTE — Progress Notes (Signed)
ANTICOAGULATION CONSULT NOTE - Follow Up Consult  Pharmacy Consult for Heparin Indication: Recent PE  No Known Allergies  Patient Measurements: Height: 5\' 8"  (172.7 cm) Weight: 181 lb (82.1 kg) IBW/kg (Calculated) : 68.4  Vital Signs: Temp: 98.4 F (36.9 C) (03/19 0700) Temp Source: Core (Comment) (03/19 0400) BP: 111/58 mmHg (03/19 0600) Pulse Rate: 105 (03/19 0700)  Labs:  Recent Labs  09/16/14 0359  09/16/14 1700 09/17/14 0425 09/17/14 0500 09/17/14 1200 09/17/14 2206 09/18/14 0400  HGB 10.9*  --   --  9.9*  --   --   --  8.3*  HCT 36.6*  --   --  32.8*  --   --   --  27.2*  PLT 586*  --   --  491*  --   --   --  482*  LABPROT 22.9*  --   --   --  19.5*  --   --  19.1*  INR 2.01*  --   --   --  1.64*  --   --  1.59*  HEPARINUNFRC 0.88*  < >  --   --  0.20* 0.48 0.23* 0.34  CREATININE 1.30  --  1.17 1.19  --   --   --  1.06  < > = values in this interval not displayed.  Estimated Creatinine Clearance: 71.7 mL/min (by C-G formula based on Cr of 1.06).  Assessment: 68yom admitted with HF exacerbation - plan LVAD on 3/21. Recent PE use heparin as a bridge while off warfarin for LVAD. 2/6 PE is main AC reason - goal 0.3-0.7 despite IABP. Recent MI with DES in 12/15. Plan to start tirofiban today. INR 1.59, Heparin gtt @ 1500 uts/hr, HL now subtherapeutic at 0.26. No bleeding. Hgb dropped to 8.3, plts 482.   Goal of Therapy:  Heparin level 0.3-0.7 units/mL for IABP (Once tirofiban started reduce goal to 0.3-0.5) Monitor platelets by anticoagulation protocol: Yes   Plan:  Increase heparin gtt to 1600 units/hr Check HL at 2000  Monitor daily HL, INR, CBC, s/sx of bleeding F/U LVAD plans on 3/21, tirofiban start  Scott [name] 09/18/2014,8:29 AM

## 2014-09-18 NOTE — Progress Notes (Addendum)
ANTICOAGULATION CONSULT NOTE - Follow Up Consult  Pharmacy Consult for Heparin  Indication: Hx of VTE  No Known Allergies  Patient Measurements: Height: 5\' 8"  (172.7 cm) Weight: 181 lb (82.1 kg) IBW/kg (Calculated) : 68.4  Vital Signs: Temp: 99.7 F (37.6 C) (03/19 2200) Temp Source: Core (Comment) (03/19 2000) BP: 99/49 mmHg (03/19 2200) Pulse Rate: 106 (03/19 2200)  Labs:  Recent Labs  09/16/14 0359  09/16/14 1700 09/17/14 0425 09/17/14 0500  09/18/14 0400 09/18/14 1020 09/18/14 1400 09/18/14 2230  HGB 10.9*  --   --  9.9*  --   --  8.3*  --  10.1* 9.7*  HCT 36.6*  --   --  32.8*  --   --  27.2*  --  33.3* 32.1*  PLT 586*  --   --  491*  --   --  482*  --  433* 406*  LABPROT 22.9*  --   --   --  19.5*  --  19.1*  --   --   --   INR 2.01*  --   --   --  1.64*  --  1.59*  --   --   --   HEPARINUNFRC 0.88*  < >  --   --  0.20*  < > 0.34 0.26*  --  0.29*  CREATININE 1.30  --  1.17 1.19  --   --  1.06  --   --   --   < > = values in this interval not displayed.  Estimated Creatinine Clearance: 71.7 mL/min (by C-G formula based on Cr of 1.06).  Assessment: Sub-therapeutic heparin level, no issues per RN.   Goal of Therapy:  Heparin level 0.3-0.7 units/ml Monitor platelets by anticoagulation protocol: Yes   Plan:  -Increase heparin drip to 1700 units/hr -0500 HL -Daily CBC/HL -Monitor for bleeding  Narda Bonds 09/18/2014,11:11 PM  ==================== Addendum 5:12 AM HL this AM is 0.26 -Increase heparin to 1850 units/hr -1200 HL JLedford, PharmD

## 2014-09-19 ENCOUNTER — Inpatient Hospital Stay (HOSPITAL_COMMUNITY): Payer: Medicare Other

## 2014-09-19 DIAGNOSIS — D72829 Elevated white blood cell count, unspecified: Secondary | ICD-10-CM

## 2014-09-19 LAB — CBC
HEMATOCRIT: 31.6 % — AB (ref 39.0–52.0)
HEMOGLOBIN: 9.6 g/dL — AB (ref 13.0–17.0)
MCH: 28.1 pg (ref 26.0–34.0)
MCHC: 30.4 g/dL (ref 30.0–36.0)
MCV: 92.4 fL (ref 78.0–100.0)
PLATELETS: 413 10*3/uL — AB (ref 150–400)
RBC: 3.42 MIL/uL — ABNORMAL LOW (ref 4.22–5.81)
RDW: 17.4 % — AB (ref 11.5–15.5)
WBC: 12.7 10*3/uL — ABNORMAL HIGH (ref 4.0–10.5)

## 2014-09-19 LAB — CARBOXYHEMOGLOBIN
Carboxyhemoglobin: 1.2 % (ref 0.5–1.5)
Methemoglobin: 0.9 % (ref 0.0–1.5)
O2 Saturation: 64.8 %
TOTAL HEMOGLOBIN: 9.9 g/dL — AB (ref 13.5–18.0)

## 2014-09-19 LAB — BASIC METABOLIC PANEL
ANION GAP: 3 — AB (ref 5–15)
BUN: 13 mg/dL (ref 6–23)
CO2: 41 mmol/L (ref 19–32)
CREATININE: 0.95 mg/dL (ref 0.50–1.35)
Calcium: 8.5 mg/dL (ref 8.4–10.5)
Chloride: 91 mmol/L — ABNORMAL LOW (ref 96–112)
GFR calc Af Amer: >90 mL/min (ref >90)
GFR, EST NON AFRICAN AMERICAN: 85 mL/min — AB (ref >90)
GLUCOSE: 123 mg/dL — AB (ref 70–99)
Potassium: 3.4 mmol/L — ABNORMAL LOW (ref 3.5–5.1)
SODIUM: 135 mmol/L (ref 135–145)

## 2014-09-19 LAB — HEPARIN LEVEL (UNFRACTIONATED)
HEPARIN UNFRACTIONATED: 0.62 [IU]/mL (ref 0.30–0.70)
Heparin Unfractionated: 0.26 IU/mL — ABNORMAL LOW (ref 0.30–0.70)
Heparin Unfractionated: 0.36 IU/mL (ref 0.30–0.70)

## 2014-09-19 LAB — PROTIME-INR
INR: 1.35 (ref 0.00–1.49)
Prothrombin Time: 16.8 seconds — ABNORMAL HIGH (ref 11.6–15.2)

## 2014-09-19 MED ORDER — HEPARIN (PORCINE) IN NACL 100-0.45 UNIT/ML-% IJ SOLN
1800.0000 [IU]/h | INTRAMUSCULAR | Status: DC
  Administered 2014-09-20: 1850 [IU]/h via INTRAVENOUS
  Filled 2014-09-19 (×7): qty 250

## 2014-09-19 MED ORDER — IOHEXOL 350 MG/ML SOLN
80.0000 mL | Freq: Once | INTRAVENOUS | Status: AC | PRN
  Administered 2014-09-19: 80 mL via INTRAVENOUS

## 2014-09-19 MED ORDER — POTASSIUM CHLORIDE CRYS ER 20 MEQ PO TBCR
40.0000 meq | EXTENDED_RELEASE_TABLET | Freq: Once | ORAL | Status: AC
  Administered 2014-09-19: 40 meq via ORAL
  Filled 2014-09-19: qty 2

## 2014-09-19 MED ORDER — FUROSEMIDE 10 MG/ML IJ SOLN
40.0000 mg | Freq: Every day | INTRAMUSCULAR | Status: DC
  Administered 2014-09-19 – 2014-09-20 (×2): 40 mg via INTRAVENOUS
  Filled 2014-09-19: qty 4

## 2014-09-19 MED ORDER — POTASSIUM CHLORIDE 10 MEQ/50ML IV SOLN
10.0000 meq | INTRAVENOUS | Status: AC
  Administered 2014-09-19 (×2): 10 meq via INTRAVENOUS
  Filled 2014-09-19 (×2): qty 50

## 2014-09-19 MED ORDER — TIROFIBAN HCL IV 5 MG/100ML
0.0750 ug/kg/min | INTRAVENOUS | Status: AC
  Administered 2014-09-19 – 2014-09-20 (×3): 0.075 ug/kg/min via INTRAVENOUS
  Filled 2014-09-19 (×7): qty 100

## 2014-09-19 NOTE — Progress Notes (Signed)
Patient ID: ADVAIT BUICE, male   DOB: 03/21/1946, 69 y.o.   MRN: [id number] Advanced Heart Failure Rounding Note   Subjective:    -Thoracentesis 3/17. Levophed add to milrinone for low co-ox. Effusion was transudative/inflammatory. Started broad spectrum abx -3/18 recurrent low-output Swan put back in and IABP placed. Pedal pulses palpable today.   Remains on milrinone @ 0.25 mcg/levophed 10. IABP 1:1. Feels better overall. Co-ox 65%. Creatinine normal. Filling pressures near-normal.  Tmax 99.   Off Plavix, tirofiban begun on 3/20.    LE u/s: DVT involving the right femoral vein and right peroneal vein.  Hgb 9.6.  WBC 12.6 -> 15.1 -> 14 -> 12.7 P2Y12  70 -> 175  Swan #s: CVP 7 PAP 52/22  PCWP 19 Fick CI 2.1  Objective:   Weight Range:  Vital Signs:   Temp:  [98.2 F (36.8 C)-100.2 F (37.9 C)] 98.4 F (36.9 C) (03/20 0700) Pulse Rate:  [99-228] 103 (03/20 0100) Resp:  [15-30] 19 (03/20 0700) BP: (92-112)/(46-55) 96/52 mmHg (03/20 0600) SpO2:  [93 %-100 %] 96 % (03/20 0700) Weight:  [178 lb 9.2 oz (81 kg)] 178 lb 9.2 oz (81 kg) (03/20 0500) Last BM Date: 09/17/14  Weight change: Filed Weights   09/17/14 1152 09/18/14 0500 09/19/14 0500  Weight: 188 lb 7.9 oz (85.5 kg) 181 lb (82.1 kg) 178 lb 9.2 oz (81 kg)    Intake/Output:   Intake/Output Summary (Last 24 hours) at 09/19/14 0747 Last data filed at 09/19/14 0700  Gross per 24 hour  Intake 3219.17 ml  Output   4955 ml  Net -1735.83 ml     Physical Exam: General: lying in bed No resp difficulty HEENT: normal Neck: supple.  JVP ~ 8.  Carotids 2+ bilaterally; no bruits. No lymphadenopathy or thryomegaly appreciated. Cor: PMI laterally displaced. Tachy regular. +s3 Lungs: clear Abdomen: soft, nontender, nondistended. No hepatosplenomegaly. No bruits or masses. Good bowel sounds. Extremities: no cyanosis, clubbing, rash, no edema  RFA IABP ok.   RUE PICC Neuro: alert & orientedx3, cranial nerves grossly  intact. Moves all 4 extremities w/o difficulty. Affect pleasant.  Telemetry: ST 100s   Labs: Basic Metabolic Panel:  Recent Labs Lab 09/16/14 0359 09/16/14 1700 09/17/14 0425 09/18/14 0400 09/19/14 0400  NA 137 133* 129* 134* 135  K 5.0 4.3 4.3 3.8 3.4*  CL 101 97 94* 92* 91*  CO2 27 28 29  35* 41*  GLUCOSE 157* 150* 291* 138* 123*  BUN 16 18 18 18 13   CREATININE 1.30 1.17 1.19 1.06 0.95  CALCIUM 8.8 8.3* 8.5 8.4 8.5    Liver Function Tests:  Recent Labs Lab 09/16/14 1700  AST 46*  ALT 62*  ALKPHOS 226*  BILITOT 1.2  PROT 7.7  ALBUMIN 3.0*   No results for input(s): LIPASE, AMYLASE in the last 168 hours. No results for input(s): AMMONIA in the last 168 hours.  CBC:  Recent Labs Lab 09/17/14 0425 09/18/14 0400 09/18/14 1400 09/18/14 2230 09/19/14 0400  WBC 12.6* 15.1* 14.0* 13.3* 12.7*  HGB 9.9* 8.3* 10.1* 9.7* 9.6*  HCT 32.8* 27.2* 33.3* 32.1* 31.6*  MCV 93.7 93.2 93.5 92.0 92.4  PLT 491* 482* 433* 406* 413*    Cardiac Enzymes: No results for input(s): CKTOTAL, CKMB, CKMBINDEX, TROPONINI in the last 168 hours.  BNP: BNP (last 3 results)  Recent Labs  07/20/14 1053 08/27/14 0253 09/10/14 1000  BNP 773.6* 1971.6* 2173.4*    ProBNP (last 3 results) No results for input(s): PROBNP in  the last 8760 hours.    Other results:   Imaging: No results found.   Medications:     Scheduled Medications: . sodium chloride   Intravenous Once  . cefTAZidime (FORTAZ)  IV  2 g Intravenous 3 times per day  . digoxin  0.125 mg Oral Daily  . furosemide  40 mg Intravenous Daily  . multivitamin with minerals  1 tablet Oral Daily  . potassium chloride  10 mEq Intravenous Q1 Hr x 2  . potassium chloride  40 mEq Oral Once  . rosuvastatin  10 mg Oral q1800  . sodium chloride  10-40 mL Intracatheter Q12H  . sodium chloride  3 mL Intravenous Q12H  . sodium chloride  3 mL Intravenous Q12H  . spironolactone  12.5 mg Oral Daily  . vancomycin  1,000 mg  Intravenous Q12H    Infusions: . heparin 1,850 Units/hr (09/19/14 0511)  . milrinone 0.25 mcg/kg/min (09/19/14 0532)  . norepinephrine (LEVOPHED) Adult infusion 10 mcg/min (09/19/14 9826)  . tirofiban 0.075 mcg/kg/min (09/19/14 0400)    PRN Medications: sodium chloride, sodium chloride, acetaminophen, ondansetron (ZOFRAN) IV, sodium chloride, sodium chloride, sodium chloride, traMADol   Assessment:   1. A/C systolic HF class IV with cardiogenic shock- EF 15% with moderate RV dysfunction 3/16 echo.  2. CAD s/p Anterior STEMI on 06/12/14 with stenting of LAD- on coumadin + plavix, statin.   3. 06/12/14 VT in setting of STEMI 4. Ankylosing spondylitis. 5. Bilateral PE with R DVT- S/P IVC filter 08/27/2014--->coumadin 6. Asystole noted from Life Vest 7. LV thrombus 8. Full Code 9. Hemoptysis, resolved 10. A/C renal failure, resolved 11. Pleural effusion, transudative  12. ?PNA  Plan/Discussion:    Much improved with IABP support. Will continue IABP at 1:1 and milrinone/norepinephrine. Volume status much improved. Renal function normalized. Lasix 40 mg IV daily starting today. Can change as needed. Off Plavix and on tirofiban given recent stent. Continue heparin. High risk-VAD early this week, Dr [name] to see today.    Has residual RLE DVT.  IVC filter in place. Continue heparin.   Pleural effusion tapped by CCM on 3/17, possible PNA. On broad spectrum abx for infiltrates. WBCs now coming down, Tmax 99.  Will continue vancomycin/ceftazidime. Repeat CXR today.  PCT not high.   Hemoglobin stable.   The patient is critically ill with multiple organ systems failure and requires high complexity decision making for assessment and support, frequent evaluation and titration of therapies, application of advanced monitoring technologies and extensive interpretation of multiple databases.   Critical Care Time devoted to patient care services described in this note is 35  Minutes.  Ashten Prats,MD  7:47 AM  09/19/2014 Advanced Heart Failure Team Pager [phone] (M-F; 7a - 4p)  Please contact [facility] for night-coverage after hours (4p -7a ) and weekends on [email or web address]

## 2014-09-19 NOTE — Progress Notes (Addendum)
MEDICATION RELATED CONSULT NOTE - FOLLOW UP   Pharmacy Consult for Heparin / Vancomycin & Ceftazidime Indication: Hx of VTE /  R/O PNA  No Known Allergies  Patient Measurements: Height: 5\' 8"  (172.7 cm) Weight: 178 lb 9.2 oz (81 kg) IBW/kg (Calculated) : 68.4  Vital Signs: Temp: 99.7 F (37.6 C) (03/20 1200) Temp Source: Core (Comment) (03/20 0803) BP: 112/90 mmHg (03/20 1000) Intake/Output from previous day: 03/19 0701 - 03/20 0700 In: 3219.2 [P.O.:620; I.V.:1999.2; IV Piggyback:600] Out: 0626 [Urine:4955] Intake/Output from this shift: Total I/O In: 852.4 [P.O.:240; I.V.:312.4; IV Piggyback:300] Out: 625 [Urine:625]  Labs:  Recent Labs  09/16/14 1700  09/17/14 0425 09/18/14 0400 09/18/14 1400 09/18/14 2230 09/19/14 0400  WBC  --   < > 12.6* 15.1* 14.0* 13.3* 12.7*  HGB  --   < > 9.9* 8.3* 10.1* 9.7* 9.6*  HCT  --   < > 32.8* 27.2* 33.3* 32.1* 31.6*  PLT  --   < > 491* 482* 433* 406* 413*  CREATININE 1.17  --  1.19 1.06  --   --  0.95  ALBUMIN 3.0*  --   --   --   --   --   --   PROT 7.7  --   --   --   --   --   --   AST 46*  --   --   --   --   --   --   ALT 62*  --   --   --   --   --   --   ALKPHOS 226*  --   --   --   --   --   --   BILITOT 1.2  --   --   --   --   --   --   < > = values in this interval not displayed. Estimated Creatinine Clearance: 74 mL/min (by C-G formula based on Cr of 0.95).   Microbiology: Recent Results (from the past 720 hour(s))  MRSA PCR Screening     Status: None   Collection Time: 08/27/14  5:39 AM  Result Value Ref Range Status   MRSA by PCR NEGATIVE NEGATIVE Final    Comment:        The GeneXpert MRSA Assay (FDA approved for NASAL specimens only), is one component of a comprehensive MRSA colonization surveillance program. It is not intended to diagnose MRSA infection nor to guide or monitor treatment for MRSA infections.   Clostridium Difficile by PCR     Status: None   Collection Time: 08/30/14 10:35 AM   Result Value Ref Range Status   C difficile by pcr NEGATIVE NEGATIVE Final  Body fluid culture     Status: None (Preliminary result)   Collection Time: 09/16/14  3:45 PM  Result Value Ref Range Status   Specimen Description FLUID RIGHT PLEURAL  Final   Special Requests NONE  Final   Gram Stain   Final    RARE WBC PRESENT,BOTH PMN AND MONONUCLEAR NO ORGANISMS SEEN Performed at [facility]    Culture   Final    NO GROWTH 2 DAYS Performed at [facility]    Report Status PENDING  Incomplete  Surgical pcr screen     Status: None   Collection Time: 09/17/14 11:24 AM  Result Value Ref Range Status   MRSA, PCR NEGATIVE NEGATIVE Final   Staphylococcus aureus NEGATIVE NEGATIVE Final    Comment:  The Xpert SA Assay (FDA approved for NASAL specimens in patients over 35 years of age), is one component of a comprehensive surveillance program.  Test performance has been validated by [facility] for patients greater than or equal to 48 year old. It is not intended to diagnose infection nor to guide or monitor treatment.     Assessment: 68yom admitted with HF exacerbation - plan LVAD on 3/21. Recent PE use heparin as a bridge while off warfarin for LVAD. 2/6 PE is main AC reason - goal 0.3-0.7 despite IABP. Heparin level goal is 0.3-0.5 while on tirofiban. Recent MI with DES in 12/15. INR 1.35, Heparin gtt @ 1850 uts/hr, HL now slightly above goal at 0.62. No bleeding. Hgb dropped to 9.6, plts 413.   Vancomycin and Ceftazidime for possible PNA. Pleural effusion was tapped by CCM on 3/17. Day # 4 for possible PNA. Pt is afebrile, WBC down to 12.7. SCr 0.95, CrCl ~39ml/min.   Goals of Therapy:  Heparin level 0.3-0.5 units/mL  Vancomycin level 15-30mcg/mL Resolution of infection  Plan:  Decrease heparin gtt slightly to 1800 units/hr Check confirmatory HL at 2000  Monitor daily HL, INR, CBC, s/sx of bleeding F/U LVAD plans on 3/21 Continue vancomycin 1g IV  Q12 Continue ceftazidime 2g IV Q8 Monitor renal function, clinical picture, VT prn Check VT tomorrow at 0930 F/U abx LOT  Scott [name],Scott [name] 09/19/2014,1:04 PM   Addendum: Confirmatory heparin level is therapeutic at 0.36 but has dropped from earlier. Increase rate back up to1850 units/hr and follow up AM labs.   Nena Jordan, PharmD, BCPS 09/19/2014, 9:20 PM

## 2014-09-19 NOTE — Progress Notes (Signed)
[facility] ADULT ICU REPLACEMENT PROTOCOL FOR AM LAB REPLACEMENT ONLY  The patient does apply for the [facility] Adult ICU Electrolyte Replacment Protocol based on the criteria listed below:   1. Is GFR >/= 40 ml/min? Yes.    Patient's GFR today is 85 2. Is urine output >/= 0.5 ml/kg/hr for the last 6 hours? Yes.   Patient's UOP is 1.7 ml/kg/hr 3. Is BUN < 60 mg/dL? Yes.    Patient's BUN today is 13 4. Abnormal electrolyte(s): K 3.4 5. Ordered repletion with: per protocol 6. If a panic level lab has been reported, has the CCM MD in charge been notified? No..   Physician:    Ronda Fairly A 09/19/2014 6:07 AM

## 2014-09-20 ENCOUNTER — Encounter (HOSPITAL_COMMUNITY)
Admission: AD | Disposition: A | Discharge status: Home Health | Payer: Self-pay | Source: Ambulatory Visit | Attending: Cardiothoracic Surgery

## 2014-09-20 ENCOUNTER — Encounter (HOSPITAL_COMMUNITY): Payer: Self-pay | Admitting: Anesthesiology

## 2014-09-20 DIAGNOSIS — I2589 Other forms of chronic ischemic heart disease: Secondary | ICD-10-CM

## 2014-09-20 LAB — CARBOXYHEMOGLOBIN
CARBOXYHEMOGLOBIN: 1.1 % (ref 0.5–1.5)
Carboxyhemoglobin: 1.4 % (ref 0.5–1.5)
METHEMOGLOBIN: 0.8 % (ref 0.0–1.5)
Methemoglobin: 1 % (ref 0.0–1.5)
O2 SAT: 55.2 %
O2 SAT: 57.5 %
TOTAL HEMOGLOBIN: 9.5 g/dL — AB (ref 13.5–18.0)
TOTAL HEMOGLOBIN: 10.4 g/dL — AB (ref 13.5–18.0)

## 2014-09-20 LAB — CBC
HEMATOCRIT: 32.6 % — AB (ref 39.0–52.0)
Hemoglobin: 9.8 g/dL — ABNORMAL LOW (ref 13.0–17.0)
MCH: 27.8 pg (ref 26.0–34.0)
MCHC: 30.1 g/dL (ref 30.0–36.0)
MCV: 92.6 fL (ref 78.0–100.0)
PLATELETS: 394 10*3/uL (ref 150–400)
RBC: 3.52 MIL/uL — ABNORMAL LOW (ref 4.22–5.81)
RDW: 17.4 % — ABNORMAL HIGH (ref 11.5–15.5)
WBC: 12.6 10*3/uL — AB (ref 4.0–10.5)

## 2014-09-20 LAB — BODY FLUID CULTURE: CULTURE: NO GROWTH

## 2014-09-20 LAB — VANCOMYCIN, TROUGH: VANCOMYCIN TR: 14.7 ug/mL (ref 10.0–20.0)

## 2014-09-20 LAB — BASIC METABOLIC PANEL
Anion gap: 7 (ref 5–15)
BUN: 11 mg/dL (ref 6–23)
CO2: 36 mmol/L — ABNORMAL HIGH (ref 19–32)
Calcium: 8.6 mg/dL (ref 8.4–10.5)
Chloride: 91 mmol/L — ABNORMAL LOW (ref 96–112)
Creatinine, Ser: 0.88 mg/dL (ref 0.50–1.35)
GFR calc Af Amer: >90 mL/min (ref >90)
GFR calc non Af Amer: 88 mL/min — ABNORMAL LOW (ref >90)
GLUCOSE: 127 mg/dL — AB (ref 70–99)
POTASSIUM: 4 mmol/L (ref 3.5–5.1)
Sodium: 134 mmol/L — ABNORMAL LOW (ref 135–145)

## 2014-09-20 LAB — APTT: aPTT: 120 seconds — ABNORMAL HIGH (ref 24–37)

## 2014-09-20 LAB — PREPARE RBC (CROSSMATCH)

## 2014-09-20 LAB — FACTOR 5 LEIDEN

## 2014-09-20 LAB — PROTIME-INR
INR: 1.28 (ref 0.00–1.49)
INR: 1.25 (ref 0.00–1.49)
PROTHROMBIN TIME: 16.1 s — AB (ref 11.6–15.2)
Prothrombin Time: 15.8 seconds — ABNORMAL HIGH (ref 11.6–15.2)

## 2014-09-20 LAB — HEPARIN LEVEL (UNFRACTIONATED): HEPARIN UNFRACTIONATED: 0.67 [IU]/mL (ref 0.30–0.70)

## 2014-09-20 LAB — MAGNESIUM: MAGNESIUM: 2 mg/dL (ref 1.5–2.5)

## 2014-09-20 SURGERY — INSERTION OF IMPLANTABLE LEFT VENTRICULAR ASSIST DEVICE
Anesthesia: General | Site: Chest

## 2014-09-20 MED ORDER — CHLORHEXIDINE GLUCONATE 4 % EX LIQD
60.0000 mL | Freq: Once | CUTANEOUS | Status: AC
  Administered 2014-09-20: 4 via TOPICAL
  Filled 2014-09-20: qty 60

## 2014-09-20 MED ORDER — FUROSEMIDE 10 MG/ML IJ SOLN
80.0000 mg | Freq: Once | INTRAMUSCULAR | Status: AC
  Administered 2014-09-20: 80 mg via INTRAVENOUS
  Filled 2014-09-20: qty 8

## 2014-09-20 MED ORDER — DEXTROSE 5 % IV SOLN
750.0000 mg | INTRAVENOUS | Status: DC
  Filled 2014-09-20 (×2): qty 750

## 2014-09-20 MED ORDER — VANCOMYCIN HCL 1000 MG IV SOLR
1000.0000 mg | INTRAVENOUS | Status: AC
  Administered 2014-09-21: 1000 mg
  Filled 2014-09-20: qty 1000

## 2014-09-20 MED ORDER — VANCOMYCIN HCL 10 G IV SOLR
1250.0000 mg | INTRAVENOUS | Status: AC
  Administered 2014-09-21: 1250 mg via INTRAVENOUS
  Filled 2014-09-20: qty 1250

## 2014-09-20 MED ORDER — VASOPRESSIN 20 UNIT/ML IV SOLN
0.0400 [IU]/min | INTRAVENOUS | Status: DC
  Filled 2014-09-20: qty 2

## 2014-09-20 MED ORDER — SODIUM CHLORIDE 0.9 % IV SOLN
600.0000 mg | Freq: Once | INTRAVENOUS | Status: AC
  Administered 2014-09-21: 600 mg via INTRAVENOUS
  Filled 2014-09-20: qty 600

## 2014-09-20 MED ORDER — POTASSIUM CHLORIDE CRYS ER 20 MEQ PO TBCR
40.0000 meq | EXTENDED_RELEASE_TABLET | Freq: Once | ORAL | Status: AC
  Administered 2014-09-20: 40 meq via ORAL
  Filled 2014-09-20: qty 2

## 2014-09-20 MED ORDER — FLUCONAZOLE IN SODIUM CHLORIDE 400-0.9 MG/200ML-% IV SOLN
400.0000 mg | INTRAVENOUS | Status: AC
  Administered 2014-09-21: 400 mg via INTRAVENOUS
  Filled 2014-09-20: qty 200

## 2014-09-20 MED ORDER — RIFAMPIN 300 MG PO CAPS: 600.0000 mg | ORAL_CAPSULE | Freq: Once | ORAL | Status: DC

## 2014-09-20 MED ORDER — FENTANYL CITRATE 0.05 MG/ML IJ SOLN
50.0000 ug | INTRAMUSCULAR | Status: DC | PRN
  Administered 2014-09-21 – 2014-09-23 (×2): 50 ug via INTRAVENOUS
  Administered 2014-09-24: 25 ug via INTRAVENOUS
  Filled 2014-09-20 (×3): qty 2

## 2014-09-20 MED ORDER — POTASSIUM CHLORIDE 2 MEQ/ML IV SOLN
80.0000 meq | INTRAVENOUS | Status: DC
  Filled 2014-09-20: qty 40

## 2014-09-20 MED ORDER — DOBUTAMINE IN D5W 4-5 MG/ML-% IV SOLN
2.0000 ug/kg/min | INTRAVENOUS | Status: DC
  Filled 2014-09-20: qty 250

## 2014-09-20 MED ORDER — EPINEPHRINE HCL 1 MG/ML IJ SOLN
0.0000 ug/min | INTRAVENOUS | Status: AC
  Administered 2014-09-21: 2 ug/min via INTRAVENOUS
  Filled 2014-09-20: qty 4

## 2014-09-20 MED ORDER — MAGNESIUM SULFATE 50 % IJ SOLN
40.0000 meq | INTRAMUSCULAR | Status: DC
  Filled 2014-09-20: qty 10

## 2014-09-20 MED ORDER — DEXMEDETOMIDINE HCL IN NACL 400 MCG/100ML IV SOLN
0.1000 ug/kg/h | INTRAVENOUS | Status: AC
  Administered 2014-09-21: 0.2 ug/kg/h via INTRAVENOUS
  Filled 2014-09-20: qty 100

## 2014-09-20 MED ORDER — CEFUROXIME SODIUM 1.5 G IJ SOLR
1.5000 g | INTRAMUSCULAR | Status: AC
  Administered 2014-09-21: .75 g via INTRAVENOUS
  Administered 2014-09-21: 1.5 g via INTRAVENOUS
  Filled 2014-09-20: qty 1.5

## 2014-09-20 MED ORDER — MILRINONE IN DEXTROSE 20 MG/100ML IV SOLN
0.3000 ug/kg/min | INTRAVENOUS | Status: DC
  Filled 2014-09-20: qty 100

## 2014-09-20 MED ORDER — DOPAMINE-DEXTROSE 3.2-5 MG/ML-% IV SOLN
0.0000 ug/kg/min | INTRAVENOUS | Status: DC
  Filled 2014-09-20: qty 250

## 2014-09-20 MED ORDER — BISACODYL 5 MG PO TBEC: 5.0000 mg | DELAYED_RELEASE_TABLET | Freq: Once | ORAL | Status: DC

## 2014-09-20 MED ORDER — FUROSEMIDE 10 MG/ML IJ SOLN
40.0000 mg | Freq: Two times a day (BID) | INTRAMUSCULAR | Status: DC
  Administered 2014-09-20: 40 mg via INTRAVENOUS
  Filled 2014-09-20 (×3): qty 4

## 2014-09-20 MED ORDER — NOREPINEPHRINE BITARTRATE 1 MG/ML IV SOLN
2.0000 ug/min | INTRAVENOUS | Status: DC
  Administered 2014-09-20 – 2014-09-22 (×2): 10 ug/min via INTRAVENOUS
  Filled 2014-09-20 (×3): qty 16

## 2014-09-20 MED ORDER — MUPIROCIN 2 % EX OINT
1.0000 "application " | TOPICAL_OINTMENT | Freq: Two times a day (BID) | CUTANEOUS | Status: DC
  Administered 2014-09-20: 1 via NASAL
  Filled 2014-09-20: qty 22

## 2014-09-20 MED ORDER — METOLAZONE 2.5 MG PO TABS
2.5000 mg | ORAL_TABLET | Freq: Once | ORAL | Status: AC
  Administered 2014-09-20: 2.5 mg via ORAL
  Filled 2014-09-20: qty 1

## 2014-09-20 MED ORDER — SODIUM CHLORIDE 0.9 % IV SOLN
INTRAVENOUS | Status: AC
  Administered 2014-09-21: 69.8 mL/h via INTRAVENOUS
  Filled 2014-09-20: qty 40

## 2014-09-20 MED ORDER — TEMAZEPAM 15 MG PO CAPS
15.0000 mg | ORAL_CAPSULE | Freq: Once | ORAL | Status: AC | PRN
  Administered 2014-09-20: 15 mg via ORAL
  Filled 2014-09-20: qty 1

## 2014-09-20 MED ORDER — SODIUM CHLORIDE 0.9 % IV SOLN
INTRAVENOUS | Status: DC
  Filled 2014-09-20: qty 30

## 2014-09-20 MED ORDER — SODIUM CHLORIDE 0.9 % IV SOLN
INTRAVENOUS | Status: AC
  Administered 2014-09-21: 3.4 [IU]/h via INTRAVENOUS
  Filled 2014-09-20: qty 2.5

## 2014-09-20 MED ORDER — RIFAMPIN 300 MG PO CAPS
600.0000 mg | ORAL_CAPSULE | Freq: Once | ORAL | Status: DC
  Filled 2014-09-20: qty 2

## 2014-09-20 MED ORDER — NITROGLYCERIN IN D5W 200-5 MCG/ML-% IV SOLN
0.0000 ug/min | INTRAVENOUS | Status: DC
  Filled 2014-09-20: qty 250

## 2014-09-20 MED ORDER — PHENYLEPHRINE HCL 10 MG/ML IJ SOLN
0.0000 ug/min | INTRAVENOUS | Status: DC
  Filled 2014-09-20: qty 2

## 2014-09-20 MED ORDER — NOREPINEPHRINE BITARTRATE 1 MG/ML IV SOLN
0.0000 ug/min | INTRAVENOUS | Status: DC
  Filled 2014-09-20: qty 4

## 2014-09-20 MED ORDER — CHLORHEXIDINE GLUCONATE 4 % EX LIQD
60.0000 mL | Freq: Once | CUTANEOUS | Status: AC
  Administered 2014-09-21: 4 via TOPICAL
  Filled 2014-09-20: qty 60

## 2014-09-20 NOTE — Progress Notes (Signed)
MEDICATION RELATED CONSULT NOTE - FOLLOW UP   Pharmacy Consult for Heparin / Vancomycin & Ceftazidime Indication: Hx of VTE /  R/O PNA  No Known Allergies  Patient Measurements: Height: 5\' 8"  (172.7 cm) Weight: 180 lb 8.9 oz (81.9 kg) IBW/kg (Calculated) : 68.4  Vital Signs: Temp: 98.8 F (37.1 C) (03/21 0809) Temp Source: Core (Comment) (03/21 0400) BP: 112/82 mmHg (03/21 0600) Pulse Rate: 110 (03/21 0400) Intake/Output from previous day: 03/20 0701 - 03/21 0700 In: 3397.7 [P.O.:870; I.V.:1797.7; IV Piggyback:600] Out: 2830 [Urine:2830] Intake/Output from this shift: Total I/O In: 324.2 [P.O.:220; I.V.:104.2] Out: 575 [Urine:575]  Labs:  Recent Labs  09/18/14 0400  09/18/14 2230 09/19/14 0400 09/20/14 0420  WBC 15.1*  < > 13.3* 12.7* 12.6*  HGB 8.3*  < > 9.7* 9.6* 9.8*  HCT 27.2*  < > 32.1* 31.6* 32.6*  PLT 482*  < > 406* 413* 394  CREATININE 1.06  --   --  0.95 0.88  MG  --   --   --   --  2.0  < > = values in this interval not displayed. Estimated Creatinine Clearance: 79.9 mL/min (by C-G formula based on Cr of 0.88).   Microbiology: Recent Results (from the past 720 hour(s))  MRSA PCR Screening     Status: None   Collection Time: 08/27/14  5:39 AM  Result Value Ref Range Status   MRSA by PCR NEGATIVE NEGATIVE Final    Comment:        The GeneXpert MRSA Assay (FDA approved for NASAL specimens only), is one component of a comprehensive MRSA colonization surveillance program. It is not intended to diagnose MRSA infection nor to guide or monitor treatment for MRSA infections.   Clostridium Difficile by PCR     Status: None   Collection Time: 08/30/14 10:35 AM  Result Value Ref Range Status   C difficile by pcr NEGATIVE NEGATIVE Final  Body fluid culture     Status: None (Preliminary result)   Collection Time: 09/16/14  3:45 PM  Result Value Ref Range Status   Specimen Description FLUID RIGHT PLEURAL  Final   Special Requests NONE  Final   Gram  Stain   Final    RARE WBC PRESENT,BOTH PMN AND MONONUCLEAR NO ORGANISMS SEEN Performed at [facility]    Culture   Final    NO GROWTH 2 DAYS Performed at [facility]    Report Status PENDING  Incomplete  Surgical pcr screen     Status: None   Collection Time: 09/17/14 11:24 AM  Result Value Ref Range Status   MRSA, PCR NEGATIVE NEGATIVE Final   Staphylococcus aureus NEGATIVE NEGATIVE Final    Comment:        The Xpert SA Assay (FDA approved for NASAL specimens in patients over 43 years of age), is one component of a comprehensive surveillance program.  Test performance has been validated by [facility] for patients greater than or equal to 77 year old. It is not intended to diagnose infection nor to guide or monitor treatment.     Assessment:  69yo male on warf PTA recent bilat PE and RLE DVT, s/p IVC filter (08/2014), admitted 3/11 with low output HF  PMH: CAD s/p LAD stent in 2013, DM, carotid stenting, ankylosing spondylitis, bilateral PE(08/2014), IVC filter  Coag/heme: Hx PE, IVC filter, Warfarin/plavix stopped 3/17 On heparin gtt, *DES 12/15> bridge antiplt tx; Plan per Ezzie Dural: 2d off plavix then start tirofiban at "renal  dose"> 0.075 mcg/kg/min (start Sun - P2y12 70 on 3/18, now 175). Heparin level goal is 0.3-0.5 while on tirofiban.   PTA Warfarin 2mg  Mon and Fri, 4mg  all other days  Infectious Disease  possible PNA. Afebrile, WBC  Down to 12.6, CT pulmonary hemmorage v infection.  3/17 Fluid cx > No growth  3/17 >> vanc, 3/21 VT 14.7 mcg/ml 3/17 >> ceftaz  Goals of Therapy:  Heparin level 0.3-0.5 units/mL Vancomycin level 15-52mcg/mL Resolution of infection  Plan:  Decrease heparin gtt slightly to 1800 units/hr Monitor daily HL, INR, CBC, s/sx of bleeding Continue vancomycin 1g IV Q12, trough at goal Continue ceftazidime 2g IV Q8 Follow up SCr, UOP, cultures, clinical course and adjust as clinically indicated.  Thank you for  allowing pharmacy to be a part of this patients care team.  Rowe Robert Pharm.D., BCPS, AQ-Cardiology Clinical Pharmacist 09/20/2014 10:45 AM Pager: [phone] Phone: [phone]

## 2014-09-20 NOTE — Progress Notes (Signed)
Patient ID: Scott [name], male   DOB: 1946-05-12, 69 y.o.   MRN: [id number] Advanced Heart Failure Rounding Note   Subjective:    -Thoracentesis 3/17. Levophed add to milrinone for low co-ox. Effusion was transudative/inflammatory. Started broad spectrum abx -3/18 recurrent low-output Swan put back in and IABP placed. Pedal pulses palpable today.    Off Plavix, tirofiban started on 3/20.    LE u/s: DVT involving the right femoral vein and right peroneal vein.  Remains on milrinone @ 0.25 mcg/levophed 10. IABP 1:1. Feels better overall. Co-ox 55%. Creatinine normal.  Tmax 99.  Had repeat CT chest with markedly reduced PE burden. Significant R pleural effusion s/p thoracentesis on 3/17. ? PNA  Hgb 9.8  WBC 12.6 -> 15.1 -> 14 -> 12.7-> 12.6  P2Y12  70 -> 175 INR 1.28   Swan #s: CVP 7 PAP 54/22  PCWP 24 Fick CI 2.5    Objective:   Weight Range:  Vital Signs:   Temp:  [98.2 F (36.8 C)-100 F (37.8 C)] 98.4 F (36.9 C) (03/21 0700) Pulse Rate:  [110-115] 110 (03/21 0400) Resp:  [14-30] 20 (03/21 0700) BP: (94-119)/(48-90) 112/82 mmHg (03/21 0600) SpO2:  [88 %-100 %] 96 % (03/21 0700) Weight:  [180 lb 8.9 oz (81.9 kg)] 180 lb 8.9 oz (81.9 kg) (03/21 0500) Last BM Date: 09/20/14  Weight change: Filed Weights   09/18/14 0500 09/19/14 0500 09/20/14 0500  Weight: 181 lb (82.1 kg) 178 lb 9.2 oz (81 kg) 180 lb 8.9 oz (81.9 kg)    Intake/Output:   Intake/Output Summary (Last 24 hours) at 09/20/14 0721 Last data filed at 09/20/14 0700  Gross per 24 hour  Intake 3345.59 ml  Output   2830 ml  Net 515.59 ml     Physical Exam: General: lying in bed No resp difficulty HEENT: normal Neck: supple.  JVP 6-7.  Carotids 2+ bilaterally; no bruits. No lymphadenopathy or thryomegaly appreciated. R neck swan  Cor: PMI laterally displaced. Tachy regular. +s3 Lungs: clear Abdomen: soft, nontender, nondistended. No hepatosplenomegaly. No bruits or masses. Good bowel  sounds. Extremities: no cyanosis, clubbing, rash, no edema  RFA IABP ok.   RUE PICC Neuro: alert & orientedx3, cranial nerves grossly intact. Moves all 4 extremities w/o difficulty. Affect pleasant. GU: Foley   Telemetry: ST 100s   Labs: Basic Metabolic Panel:  Recent Labs Lab 09/16/14 1700 09/17/14 0425 09/18/14 0400 09/19/14 0400 09/20/14 0420  NA 133* 129* 134* 135 134*  K 4.3 4.3 3.8 3.4* 4.0  CL 97 94* 92* 91* 91*  CO2 28 29 35* 41* 36*  GLUCOSE 150* 291* 138* 123* 127*  BUN 18 18 18 13 11   CREATININE 1.17 1.19 1.06 0.95 0.88  CALCIUM 8.3* 8.5 8.4 8.5 8.6  MG  --   --   --   --  2.0    Liver Function Tests:  Recent Labs Lab 09/16/14 1700  AST 46*  ALT 62*  ALKPHOS 226*  BILITOT 1.2  PROT 7.7  ALBUMIN 3.0*   No results for input(s): LIPASE, AMYLASE in the last 168 hours. No results for input(s): AMMONIA in the last 168 hours.  CBC:  Recent Labs Lab 09/18/14 0400 09/18/14 1400 09/18/14 2230 09/19/14 0400 09/20/14 0420  WBC 15.1* 14.0* 13.3* 12.7* 12.6*  HGB 8.3* 10.1* 9.7* 9.6* 9.8*  HCT 27.2* 33.3* 32.1* 31.6* 32.6*  MCV 93.2 93.5 92.0 92.4 92.6  PLT 482* 433* 406* 413* 394    Cardiac Enzymes: No results  for input(s): CKTOTAL, CKMB, CKMBINDEX, TROPONINI in the last 168 hours.  BNP: BNP (last 3 results)  Recent Labs  07/20/14 1053 08/27/14 0253 09/10/14 1000  BNP 773.6* 1971.6* 2173.4*    ProBNP (last 3 results) No results for input(s): PROBNP in the last 8760 hours.    Other results:   Imaging: Ct Angio Chest Pe W/cm &/or Wo Cm  09/19/2014   CLINICAL DATA:  Shortness of breath, recurrent or chronic PE, CHF  EXAM: CT ANGIOGRAPHY CHEST WITH CONTRAST  TECHNIQUE: Multidetector CT imaging of the chest was performed using the standard protocol during bolus administration of intravenous contrast. Multiplanar CT image reconstructions and MIPs were obtained to evaluate the vascular anatomy.  CONTRAST:  37mL OMNIPAQUE IOHEXOL 350 MG/ML SOLN   COMPARISON:  Unenhanced CT chest dated 09/16/2014. CTA chest dated 08/22/2014.  FINDINGS: Small subsegmental pulmonary emboli within the right upper lobe pulmonary artery (series 406/image 39), the right lower lobe pulmonary artery (series 406/images 135 and 153), and the left lower lobe pulmonary artery (series 406/image 151).  Overall clot burden is small and is significantly improved from 08/22/2014. No evidence of right heart strain.  Mediastinum/Nodes: Cardiomegaly. Left ventricular hypertrophy. Again noted is intramural thrombus in the left ventricular apex (series 401/ image 79), although decreased when compared to 08/22/2014. No pericardial effusion.  Left main and 3 vessel coronary atherosclerosis.  Right IJ Swan-Ganz catheter terminates in the proximal right upper lobe pulmonary artery (series 406/image 146).  Intra-aortic balloon pump with radiopaque marker in the descending thoracic aortic arch (series 406/image 87).  Thoracic lymphadenopathy, including:  --11 mm short axis right supraclavicular node (series 401/ image 10)  --11 mm short axis high right paratracheal node (series 401/ image 21)  --12 mm short axis right paratracheal node (series 401/ image 36)  --15 mm short axis subcarinal node (series 401/image 46)  Visualized thyroid is unremarkable.  Lungs/Pleura: Patchy subpleural opacities in the posterior right upper lobe and right middle lobe, suspicious for pneumonia or less likely pulmonary infarcts. Additional patchy right lower lobe opacities, suspicious for pneumonia.  Moderate right pleural effusion, loculated.  Associated hyperdense fluid/hemorrhage with gas in the medial right pleural space (series 401/image 84), worrisome for hemorrhage and possible superinfection with gas-forming organism/empyema (in the absence of intervention). A developing bronchopleural fistula is also possible.  Left lung is notable for patchy opacity in the medial left upper lobe (series 407/ image 54) and small  left pleural effusion.  No pneumothorax.  Upper abdomen: Visualized upper abdomen is notable for lobular splenic parenchyma in the left upper abdomen and artifact related to the patient's intra aortic balloon pump.  Musculoskeletal: Degenerative changes of the visualized thoracolumbar spine.  Review of the MIP images confirms the above findings.  IMPRESSION: Small subsegmental bilateral pulmonary emboli in this patient with known bilateral PEs. Overall clot burden is small and significantly improved from 08/22/2014. No evidence of right heart strain.  Cardiomegaly with left ventricular hypertrophy. Intramural thrombus in the left ventricular apex, decreased when compared to 08/22/2014.  Patchy subpleural opacities in the right upper and middle lobes, suspicious for pneumonia or less likely pulmonary infarcts. Patchy right lower lobe opacities, suspicious for pneumonia.  Moderate right pleural effusion, loculated. Superimposed hemorrhage with gas at the medial right lung base, suggesting superinfection/empyema in the absence of recent intervention. Bronchopleural fistula is less likely but not excluded.  Right IJ Swan-Ganz catheter terminates in the proximal right upper lobe pulmonary artery. Enteric balloon pump with radiopaque marker in the proximal  descending thoracic aortic arch.   Electronically Signed   By: Julian Hy M.D.   On: 09/19/2014 12:27   Dg Chest Port 1v Same Day  09/19/2014   CLINICAL DATA:  Followup pneumonia.  EXAM: PORTABLE CHEST - 1 VIEW SAME DAY  COMPARISON:  09/16/2014  FINDINGS: New right internal jugular Swan-Ganz catheter has its tip in the peripheral right pulmonary artery. No pneumothorax.  Right PICC is stable with its tip in the lower superior vena cava.  Confluent opacity lies in the right mid lung mildly increased in overall extent from the prior study. There is adjacent hazy airspace interstitial opacities extend to the right lung base, also mildly increased from the prior  study. Additional opacity partly obscures the right hemidiaphragm which is consistent with pleural fluid. This has mildly increased  Left lung remains clear.  There is no pulmonary edema.  Cardiac silhouette is mildly enlarged. No mediastinal or hilar masses.  IMPRESSION: 1. There is been mild worsening with the areas of airspace opacity in the right mid to lower lung mild increased since the prior exam. Has also been an in the amount of right pleural fluid. 2. New right internal jugular Swan-Ganz catheter tip projects in the peripheral right pulmonary artery. 3. No pneumothorax.  No other change.   Electronically Signed   By: Lajean Manes M.D.   On: 09/19/2014 09:29     Medications:     Scheduled Medications: . sodium chloride   Intravenous Once  . cefTAZidime (FORTAZ)  IV  2 g Intravenous 3 times per day  . digoxin  0.125 mg Oral Daily  . furosemide  40 mg Intravenous Daily  . multivitamin with minerals  1 tablet Oral Daily  . rosuvastatin  10 mg Oral q1800  . sodium chloride  10-40 mL Intracatheter Q12H  . sodium chloride  3 mL Intravenous Q12H  . sodium chloride  3 mL Intravenous Q12H  . spironolactone  12.5 mg Oral Daily  . vancomycin  1,000 mg Intravenous Q12H    Infusions: . heparin 1,850 Units/hr (09/20/14 0412)  . milrinone 0.25 mcg/kg/min (09/19/14 1953)  . norepinephrine (LEVOPHED) Adult infusion 10 mcg/min (09/20/14 0413)  . tirofiban 0.075 mcg/kg/min (09/19/14 1806)    PRN Medications: sodium chloride, sodium chloride, acetaminophen, ondansetron (ZOFRAN) IV, sodium chloride, sodium chloride, sodium chloride, traMADol   Assessment:   1. A/C systolic HF class IV with cardiogenic shock- EF 15% with moderate RV dysfunction 3/16 echo.  2. CAD s/p Anterior STEMI on 06/12/14 with stenting of LAD- on coumadin + plavix, statin.   3. 06/12/14 VT in setting of STEMI 4. Ankylosing spondylitis. 5. Bilateral PE with R DVT- S/P IVC filter 08/27/2014--->coumadin 6. Asystole noted  from Life Vest 7. LV thrombus 8. Full Code 9. Hemoptysis, resolved 10. A/C renal failure, resolved 11. Pleural effusion, transudative  12. ?PNA  Plan/Discussion:    Much improved with IABP support. Will continue IABP at 1:1 and milrinone 0.25 mcg and norepinephrine at 10.  CO-OX down to 55% Renal function normalized. Weight up 2 pounds. Continue IV lasix.   Off Plavix and on tirofiban given recent stent. Continue heparin. High risk-VAD early this week, Dr [name] to see today.    Has residual RLE DVT.  IVC filter in place. Continue heparin.   Pleural effusion tapped by CCM on 3/17, possible PNA. CT of chest- mod r pleural effusion ? Empyema. RLL opacities. On broad spectrum abx for infiltrates. WBC unchanged.  Tmax 99.  Will continue  vancomycin/ceftazidime.  Hemoglobin stable.   CLEGG,AMY, NP-C  7:21 AM  09/20/2014 Advanced Heart Failure Team Pager [phone] (M-F; 7a - 4p)  Please contact [facility] for night-coverage after hours (4p -7a ) and weekends on [email or web address]  Patient seen and examined with Darrick Grinder, NP. We discussed all aspects of the encounter. I agree with the assessment and plan as stated above.   He is improved on IABP and inotropes. I have reviewed chest CT personally and has decreased clot burden but persistent large R pleural effusion. Swan numbers done personally and volume still up. Will continue IV lasix. Continue heparin and tirofiban. Plan VAD tomorrow. Stop tirofiban at midnight. D/w Drs. VAn Trigt and Bartle. Will remove [employer].    The patient is critically ill with multiple organ systems failure and requires high complexity decision making for assessment and support, frequent evaluation and titration of therapies, application of advanced monitoring technologies and extensive interpretation of multiple databases.   Critical Care Time devoted to patient care services described in this note is 35 Minutes.  Scott Shellie Goettl,MD 6:14  PM  Addendum:  CVP and PCWP climbing despite 40 lasix this am. Now CVP 25 and PCWP 30. Will give lasix 80 and metolazone 2.5  Benay Spice 6:15 PM

## 2014-09-20 NOTE — Consult Note (Signed)
[address]       [place] [address]             [phone]        Scott [name] [place] Medical Record #[id number] Date of Birth: March 23, 1946  Referring: No ref. provider found Primary Care: [name], MD  Chief Complaint:  Shortness of breath, weakness, decreased exercise tolerance  History of Present Illness:     Patient examined, 2-D echocardiogram, previous coronary angiograms, right heart cath data, CT scans of chest all personally reviewed  69 year old Caucasian male diabetic remote smoker with ankylosing spondylitis was admitted to this hospital approximately 2 weeks ago with advanced heart failure from ischemic cardiomyopathy. The patient had an acute MI in December 2015. He presented late and had severe anterior wall damage despite acute PCI with a drug-eluting stent which open the LAD. He required intubation and a balloon pump for support. He was stabilized and discharged home on medical therapy. However the patient is continued to do poorly. He has developed advanced and progressive heart failure. He developed bilateral pulmonary emboli and a large pulmonary infarction of the right middle lobe one month ago. He was placed on heparin and then bridge to Coumadin and an IVC filter was placed. He has been wearing a LifeVest since his initial discharge from the hospital.  The patient re-presented most recently with symptoms of worsening heart failure and was found to be in cardiogenic shock. He was placed on milrinone. Right heart catheterization showed low cardiac with severely elevated filling pressures and CVP. Echocardiogram showed anterior akinesia with EF 20%. RV function appeared mild-moderate reduced but without TR. Despite increasing doses of milrinone and optimization of his fluid balance he continued to worsen and a balloon pump was placed 72 hours ago. This has improved his overall hemodynamics as a temporary measure. Because  conventional therapy to treat the patient's advanced heart failure has not been successful advanced medical therapy with mechanical cardiac support was offered to the patient. He has undergone an extensive evaluation and interview by the VAD team. The patient's neurologic, hepatic, and renal function appear to be intact. The patient has been found have good emotional social support structure from his family. The patient understands the option of VAD would allow him the potential for improved survival and improve quality of life. The patient understands that the VAD implantation would be for destination therapy. The patient understands that he is not currently a candidate for heart transplantation due to his recent pulmonary infarction which would place him at extreme risk for pulmonary infections if he required immunosuppressive drugs.   Current Activity/ Functional Status: The patient's functional status is gradually declined since December when he had his anterior MI.   Zubrod Score: At the time of surgery this patient's most appropriate activity status/level should be described as: []     0    Normal activity, no symptoms []     1    Restricted in physical strenuous activity but ambulatory, able to do out light work []     2    Ambulatory and capable of self care, unable to do work activities, up and about                 more than 50%  Of the time                            []   3    Only limited self care, in bed greater than 50% of waking hours [x]     4    Completely disabled, no self care, confined to bed or chair []     5    Moribund  Past Medical History  Diagnosis Date  . Hyperlipidemia   . Hypertension   . Diabetes   . Nephrolithiasis   . Ankylosing spondylitis   . CAD (coronary artery disease)     a. stenting of LCx 2013; b. STEMI 06/12/14 s/p PCI to LAD complicated by post cath shock requiring IABP; VT s/p DCCV, EF 20%; c. NSTEMI 06/26/14 treated medically.  . Ischemic  cardiomyopathy     a. echo 08/23/2014 EF <20%, dilated CM, mod MR/TR  . Acute on chronic respiratory failure     a. 08/2014 in setting of PE.  . Bilateral pulmonary embolism     a. 08/2014 - started on Coumadin. Retrievable IVC filter placed 08/27/14 due to RV strain and large clot burden.  . Right leg DVT     a. 08/2014.  Marland Kitchen Chronic systolic CHF (congestive heart failure)   . Hypotension   . Hemoptysis     a. 08/2014 possibly due to pulm infarct/PE.  Marland Kitchen Pleural effusion on right 08/2014 - small  . Leukocytosis   . Carotid artery disease     a. s/p stenting.  . Ventricular tachycardia     a. 06/2014 at time of MI, s/p DCCV.  Marland Kitchen Reactive thrombocytosis 09/10/2014  . Leukocytosis 09/10/2014    Past Surgical History  Procedure Laterality Date  . Left heart catheterization with coronary angiogram N/A 06/12/2014    Procedure: LEFT HEART CATHETERIZATION WITH CORONARY ANGIOGRAM;  Surgeon: Lorretta Harp, MD;  Location: [facility] CATH LAB;  Service: Cardiovascular;  Laterality: N/A;  . Right heart catheterization N/A 09/10/2014    Procedure: RIGHT HEART CATH;  Surgeon: [name], MD;  Location: [facility] CATH LAB;  Service: Cardiovascular;  Laterality: N/A;  . Right heart catheterization N/A 09/17/2014    Procedure: RIGHT HEART CATH;  Surgeon: [name], MD;  Location: [facility] CATH LAB;  Service: Cardiovascular;  Laterality: N/A;    History  Smoking status  . Former Smoker  . Quit date: 06/12/1973  Smokeless tobacco  . Not on file    History  Alcohol Use No    History   Social History  . Marital Status: Married    Spouse Name: N/A  . Number of Children: N/A  . Years of Education: N/A   Occupational History  . Not on file.   Social History Main Topics  . Smoking status: Former Smoker    Quit date: 06/12/1973  . Smokeless tobacco: Not on file  . Alcohol Use: No  . Drug Use: No  . Sexual Activity: Not Currently   Other Topics Concern  . Not on file   Social History Narrative      No Known Allergies  Current Facility-Administered Medications  Medication Dose Route Frequency Provider Last Rate Last Dose  . 0.9 %  sodium chloride infusion  250 mL Intravenous PRN Conrad Haliimaile, NP 10 mL/hr at 09/20/14 0421 250 mL at 09/20/14 0421  . 0.9 %  sodium chloride infusion   Intravenous Once Rahul P Desai, PA-C 10 mL/hr at 09/18/14 0700    . 0.9 %  sodium chloride infusion  250 mL Intravenous PRN [name], MD      . acetaminophen (TYLENOL) tablet 650 mg  650  mg Oral Q4H PRN Conrad Wharton, NP   650 mg at 09/20/14 0110  . [START ON 09/21/2014] aminocaproic acid (AMICAR) 10 g in sodium chloride 0.9 % 100 mL infusion   Intravenous To OR Franky Macho, RPH      . bisacodyl (DULCOLAX) EC tablet 5 mg  5 mg Oral Once Ivin Poot, MD      . cefTAZidime (FORTAZ) 2 g in dextrose 5 % 50 mL IVPB  2 g Intravenous 3 times per day [name], MD   2 g at 09/20/14 1122  . [START ON 09/21/2014] cefUROXime (ZINACEF) 1.5 g in dextrose 5 % 50 mL IVPB  1.5 g Intravenous To OR Franky Macho, RPH      . [START ON 09/21/2014] cefUROXime (ZINACEF) 750 mg in dextrose 5 % 50 mL IVPB  750 mg Intravenous To OR Franky Macho, RPH      . chlorhexidine (HIBICLENS) 4 % liquid 4 application  60 mL Topical Once Ivin Poot, MD       And  . Derrill Memo ON 09/21/2014] chlorhexidine (HIBICLENS) 4 % liquid 4 application  60 mL Topical Once Ivin Poot, MD      . Derrill Memo ON 09/21/2014] dexmedetomidine (PRECEDEX) 400 MCG/100ML (4 mcg/mL) infusion  0.1-0.7 mcg/kg/hr Intravenous To OR Franky Macho, RPH      . digoxin (LANOXIN) tablet 0.125 mg  0.125 mg Oral Daily [name], MD   0.125 mg at 09/20/14 0907  . [START ON 09/21/2014] DOBUTamine (DOBUTREX) infusion 4000 mcg/mL  2-10 mcg/kg/min Intravenous To OR Franky Macho, RPH      . [START ON 09/21/2014] DOPamine (INTROPIN) 800 mg in dextrose 5 % 250 mL (3.2 mg/mL) infusion  0-10 mcg/kg/min Intravenous To OR Franky Macho, RPH      . [START ON  09/21/2014] EPINEPHrine (ADRENALIN) 4 mg in dextrose 5 % 250 mL (0.016 mg/mL) infusion  0-10 mcg/min Intravenous To OR Franky Macho, RPH      . fentaNYL (SUBLIMAZE) injection 50 mcg  50 mcg Intravenous Q2H PRN [name], MD      . Derrill Memo ON 09/21/2014] fluconazole (DIFLUCAN) IVPB 400 mg  400 mg Intravenous To OR Franky Macho, RPH      . furosemide (LASIX) injection 40 mg  40 mg Intravenous BID [name], MD      . furosemide (LASIX) injection 80 mg  80 mg Intravenous Once [name], MD      . Derrill Memo ON 09/21/2014] heparin 30,000 units/NS 1000 mL solution for CELLSAVER   Other To Weber, RPH      . heparin ADULT infusion 100 units/mL (25000 units/250 mL)  1,800 Units/hr Intravenous Continuous [name], MD 18 mL/hr at 09/20/14 0854 1,800 Units/hr at 09/20/14 0854  . [START ON 09/21/2014] insulin regular (NOVOLIN R,HUMULIN R) 250 Units in sodium chloride 0.9 % 250 mL (1 Units/mL) infusion   Intravenous To OR Franky Macho, RPH      . [START ON 09/21/2014] magnesium sulfate (IV Push/IM) injection 40 mEq  40 mEq Other To OR Franky Macho, RPH      . metolazone (ZAROXOLYN) tablet 2.5 mg  2.5 mg Oral Once [name], MD      . milrinone [facility]) 20 MG/100ML (0.2 mg/mL) infusion  0.25 mcg/kg/min Intravenous Continuous Shaune Pascal Bensimhon, MD 6.8 mL/hr at 09/20/14 1123 0.25 mcg/kg/min at 09/20/14 1123  . [START ON  09/21/2014] milrinone (PRIMACOR) 20 MG/100ML (0.2 mg/mL) infusion  0.3 mcg/kg/min Intravenous To OR Franky Macho, RPH      . multivitamin with minerals tablet 1 tablet  1 tablet Oral Daily Asencion Islam, RD   1 tablet at 09/20/14 0907  . mupirocin ointment (BACTROBAN) 2 % 1 application  1 application Nasal BID Ivin Poot, MD      . Derrill Memo ON 09/21/2014] nitroGLYCERIN 50 mg in dextrose 5 % 250 mL (0.2 mg/mL) infusion  0-100 mcg/min Intravenous To OR Franky Macho, RPH      . norepinephrine (LEVOPHED) 16 mg in dextrose 5 % 250 mL (0.064 mg/mL)  infusion  2-50 mcg/min Intravenous Titrated [name], MD 9.4 mL/hr at 09/20/14 0413 10 mcg/min at 09/20/14 0413  . [START ON 09/21/2014] norepinephrine (LEVOPHED) 4 mg in dextrose 5 % 250 mL (0.016 mg/mL) infusion  0-12 mcg/min Intravenous To OR Franky Macho, RPH      . ondansetron (ZOFRAN) injection 4 mg  4 mg Intravenous Q6H PRN Amy D Clegg, NP      . Derrill Memo ON 09/21/2014] phenylephrine (NEO-SYNEPHRINE) 20 mg in dextrose 5 % 250 mL (0.08 mg/mL) infusion  0-100 mcg/min Intravenous To OR Franky Macho, RPH      . [START ON 09/21/2014] potassium chloride injection 80 mEq  80 mEq Other To OR Franky Macho, RPH      . [START ON 09/21/2014] rifampin (RIFADIN) 600 mg in sodium chloride 0.9 % 100 mL IVPB  600 mg Intravenous Once Ivin Poot, MD      . rosuvastatin (CRESTOR) tablet 10 mg  10 mg Oral q1800 Larey Dresser, MD   10 mg at 09/19/14 1716  . sodium chloride 0.9 % injection 10-40 mL  10-40 mL Intracatheter Q12H [name], MD   10 mL at 09/19/14 2157  . sodium chloride 0.9 % injection 10-40 mL  10-40 mL Intracatheter PRN [name], MD      . sodium chloride 0.9 % injection 3 mL  3 mL Intravenous Q12H Amy D Clegg, NP   3 mL at 09/17/14 2151  . sodium chloride 0.9 % injection 3 mL  3 mL Intravenous PRN Amy D Clegg, NP      . sodium chloride 0.9 % injection 3 mL  3 mL Intravenous Q12H [name], MD   3 mL at 09/19/14 2158  . sodium chloride 0.9 % injection 3 mL  3 mL Intravenous PRN [name], MD      . spironolactone (ALDACTONE) tablet 12.5 mg  12.5 mg Oral Daily [name], MD   12.5 mg at 09/20/14 0907  . temazepam (RESTORIL) capsule 15 mg  15 mg Oral Once PRN Ivin Poot, MD      . tirofiban (AGGRASTAT) infusion 50 mcg/mL 100 mL  0.075 mcg/kg/min Intravenous Continuous Ivin Poot, MD 7.4 mL/hr at 09/20/14 0900 0.075 mcg/kg/min at 09/20/14 0900  . traMADol (ULTRAM) tablet 100 mg  100 mg Oral Q6H PRN [name], MD   100 mg at  09/20/14 1002  . [START ON 09/21/2014] vancomycin (VANCOCIN) 1,250 mg in sodium chloride 0.9 % 250 mL IVPB  1,250 mg Intravenous To OR Franky Macho, RPH      . vancomycin (VANCOCIN) IVPB 1000 mg/200 mL premix  1,000 mg Intravenous Q12H [name], MD   1,000 mg at 09/20/14 1033  . [START ON 09/21/2014] vancomycin (VANCOCIN) powder 1,000 mg  1,000 mg Other To OR Franky Macho, RPH      . [START ON 09/21/2014] vasopressin (PITRESSIN) 40 Units in sodium chloride 0.9 % 250 mL (0.16 Units/mL) infusion  0.04 Units/min Intravenous To OR Franky Macho, [facility]        Prescriptions prior to admission  Medication Sig Dispense Refill Last Dose  . acetaminophen (TYLENOL) 500 MG tablet Take 1,000 mg by mouth every 6 (six) hours as needed.   09/09/2014 at Unknown time  . carvedilol (COREG) 6.25 MG tablet Take 0.5 tablets (3.125 mg total) by mouth 2 (two) times daily. 180 tablet 3 09/09/2014 at 8p  . clopidogrel (PLAVIX) 75 MG tablet Take 1 tablet (75 mg total) by mouth daily with breakfast. 90 tablet 3 09/10/2014 at Unknown time  . digoxin (LANOXIN) 0.125 MG tablet Take 1 tablet (0.125 mg total) by mouth daily. 90 tablet 3 09/09/2014 at Unknown time  . furosemide (LASIX) 40 MG tablet Take 1 tablet (40mg ) by mouth on Mondays and Fridays. Hold if weight is 196 lbs or less. 30 tablet 2 09/09/2014 at Unknown time  . ivabradine (CORLANOR) 5 MG TABS tablet Take 1 tablet (5 mg total) by mouth 2 (two) times daily with a meal. 60 tablet 3 09/10/2014 at 630a  . loperamide (IMODIUM) 1 MG/5ML solution Take 2 mg by mouth as needed for diarrhea or loose stools.   09/10/2014 at Unknown time  . losartan (COZAAR) 25 MG tablet Take 0.5 tablets (12.5 mg total) by mouth daily. Only takes when BP is higher then 90 SBP 30 tablet 6 09/09/2014 at Unknown time  . metaxalone (SKELAXIN) 800 MG tablet Take 800 mg by mouth daily as needed. As needed   09/09/2014 at Unknown time  . Multiple Vitamins-Minerals (MULTIVITAMIN WITH MINERALS) tablet Take  1 tablet by mouth daily.   09/09/2014 at Unknown time  . pantoprazole (PROTONIX) 40 MG tablet Take 1 tablet (40 mg total) by mouth daily. 90 tablet 3 09/09/2014 at Unknown time  . potassium chloride 20 MEQ TBCR Take 1 tablet (50meq) by mouth on Mondays and Fridays (with Lasix). Hold if weight is 196 lbs or less. 30 tablet 2 Past Week at Unknown time  . rosuvastatin (CRESTOR) 10 MG tablet Take 10 mg by mouth daily.   09/09/2014 at Unknown time  . SIMETHICONE PO Take 1 tablet by mouth daily as needed (bloating). Take 1 chewable tablet of [facility] as needed for bloating.   09/09/2014 at Unknown time  . traZODone (DESYREL) 100 MG tablet Take 50 mg by mouth at bedtime.   09/09/2014 at Unknown time  . warfarin (COUMADIN) 4 MG tablet Take 1 tablet (4 mg total) by mouth daily. (Patient taking differently: Take 2-4 mg by mouth daily. Patient takes 2 mg on Monday and Friday. All other days patient takes 4 mg) 30 tablet 2 09/09/2014 at 6p  . zolpidem (AMBIEN) 10 MG tablet Take 1 tablet (10 mg total) by mouth at bedtime as needed for sleep. 20 tablet 3 09/07/2014 at Unknown time  . nitroGLYCERIN (NITROSTAT) 0.3 MG SL tablet Place 0.3 mg under the tongue every 5 (five) minutes as needed for chest pain.   unknown    Family History  Problem Relation Age of Onset  . Hypertension Mother      Review of Systems:  The patient's Coumadin has been held for the past 5 days and he has been maintained on IV heparin for history of pulmonary embolus. Duplex scan shows residual thrombus in  his right femoral vein. The patient has been off Plavix 5 days.  The patient has ankylosing spondylitis with reduction in FVC and FEV1 but with adequate ABGs and nonsmoking history for 30 years.     Cardiac Review of Systems: Y or N  Chest Pain [  no  ]  Resting SOB [ yes  ] Exertional SOB  [place].Blacker  ]  Vertell Limber [place].Blacker  ]   Pedal Edema [place].Blacker   ]    Palpitations [ yes-atrial arrhythmias as well as bradycardia ] Syncope  [no  ]   Presyncope  [place].Blacker   ]  General Review of Systems: [Y] = yes [  ]=no Constitional: recent weight change [  ]; anorexia [place].Blacker  ]; fatigue [ yes ]; nausea [  ]; night sweats [  ]; fever [  ]; or chills [  ]                                                               Dental: poor dentition[  ]; Last Dentist visit: Every 6 months-Panorex satisfactory  Eye : blurred vision [  ]; diplopia [   ]; vision changes [  ];  Amaurosis fugax[  ]; Resp: cough [  ];  wheezing[  ];  hemoptysis[ yes 1 episode following PE ]; shortness of breath[ yes ]; paroxysmal nocturnal dyspnea[ yes ]; dyspnea on exertion[yes  ]; or orthopnea[yes  ];  GI:  gallstones[  ], vomiting[  ];  dysphagia[  ]; melena[  ];  hematochezia [  ]; heartburn[  ];   Hx of  Colonoscopy[ Hala.Broad ]; GU: kidney stones [  ]; hematuria[  ];   dysuria [  ];  nocturia[  ];  history of     obstruction [  ]; urinary frequency [  ]             Skin: rash, swelling[  ];, hair loss[  ];  peripheral edema[  ];  or itching[  ]; Musculosketetal: myalgias[  ];  joint swelling[  ];  joint erythema[  ];  joint pain[  ];  back pain[  ];  Heme/Lymph: bruising[ yes on Plavix ];  bleeding[  ];  anemia[  ];  Neuro: TIA[  ];  headaches[  ];  stroke[  ];  vertigo[  ];  seizures[  ];   paresthesias[  ];  difficulty walking[  ];  Psych:depression[  ]; anxiety[  ];  Endocrine: diabetes[yes on oral meds  ];  thyroid dysfunction[  ];  Immunizations: Flu [  ]; Pneumococcal[  ];  Other:  Physical Exam: BP 122/60 mmHg  Pulse 110  Temp(Src) 99.3 F (37.4 C) (Core (Comment))  Resp 17  Ht 5\' 8"  (1.727 m)  Wt 180 lb 8.9 oz (81.9 kg)  BMI 27.46 kg/m2  SpO2 96%    General: Thin Caucasian male in CCU on balloon pump no acute distress HEENT: Normocephalic pupils equal , dentition adequate Neck: Supple without JVD, adenopathy, or bruit Chest: Clear to auscultation, symmetrical breath sounds, no rhonchi, no tenderness             or deformity Cardiovascular: Regular rate and rhythm, no  murmur, positive S3 gallop, peripheral pulses  palpable in all extremities Abdomen:  Soft, nontender, no palpable mass or organomegaly Extremities: Warm, well-perfused, no clubbing cyanosis, mild pedal edema  without tenderness,              no venous stasis changes of the legs Rectal/GU: Deferred Neuro: Grossly non--focal and symmetrical throughout Skin: Clean and dry without rash or ulceration   Diagnostic Studies & Laboratory data:     Recent Radiology Findings:   Ct Angio Chest Pe W/cm &/or Wo Cm  09/19/2014   CLINICAL DATA:  Shortness of breath, recurrent or chronic PE, CHF  EXAM: CT ANGIOGRAPHY CHEST WITH CONTRAST  TECHNIQUE: Multidetector CT imaging of the chest was performed using the standard protocol during bolus administration of intravenous contrast. Multiplanar CT image reconstructions and MIPs were obtained to evaluate the vascular anatomy.  CONTRAST:  75mL OMNIPAQUE IOHEXOL 350 MG/ML SOLN  COMPARISON:  Unenhanced CT chest dated 09/16/2014. CTA chest dated 08/22/2014.  FINDINGS: Small subsegmental pulmonary emboli within the right upper lobe pulmonary artery (series 406/image 39), the right lower lobe pulmonary artery (series 406/images 135 and 153), and the left lower lobe pulmonary artery (series 406/image 151).  Overall clot burden is small and is significantly improved from 08/22/2014. No evidence of right heart strain.  Mediastinum/Nodes: Cardiomegaly. Left ventricular hypertrophy. Again noted is intramural thrombus in the left ventricular apex (series 401/ image 79), although decreased when compared to 08/22/2014. No pericardial effusion.  Left main and 3 vessel coronary atherosclerosis.  Right IJ Swan-Ganz catheter terminates in the proximal right upper lobe pulmonary artery (series 406/image 146).  Intra-aortic balloon pump with radiopaque marker in the descending thoracic aortic arch (series 406/image 87).  Thoracic lymphadenopathy, including:  --11 mm short axis right  supraclavicular node (series 401/ image 10)  --11 mm short axis high right paratracheal node (series 401/ image 21)  --12 mm short axis right paratracheal node (series 401/ image 36)  --15 mm short axis subcarinal node (series 401/image 46)  Visualized thyroid is unremarkable.  Lungs/Pleura: Patchy subpleural opacities in the posterior right upper lobe and right middle lobe, suspicious for pneumonia or less likely pulmonary infarcts. Additional patchy right lower lobe opacities, suspicious for pneumonia.  Moderate right pleural effusion, loculated.  Associated hyperdense fluid/hemorrhage with gas in the medial right pleural space (series 401/image 84), worrisome for hemorrhage and possible superinfection with gas-forming organism/empyema (in the absence of intervention). A developing bronchopleural fistula is also possible.  Left lung is notable for patchy opacity in the medial left upper lobe (series 407/ image 54) and small left pleural effusion.  No pneumothorax.  Upper abdomen: Visualized upper abdomen is notable for lobular splenic parenchyma in the left upper abdomen and artifact related to the patient's intra aortic balloon pump.  Musculoskeletal: Degenerative changes of the visualized thoracolumbar spine.  Review of the MIP images confirms the above findings.  IMPRESSION: Small subsegmental bilateral pulmonary emboli in this patient with known bilateral PEs. Overall clot burden is small and significantly improved from 08/22/2014. No evidence of right heart strain.  Cardiomegaly with left ventricular hypertrophy. Intramural thrombus in the left ventricular apex, decreased when compared to 08/22/2014.  Patchy subpleural opacities in the right upper and middle lobes, suspicious for pneumonia or less likely pulmonary infarcts. Patchy right lower lobe opacities, suspicious for pneumonia.  Moderate right pleural effusion, loculated. Superimposed hemorrhage with gas at the medial right lung base, suggesting  superinfection/empyema in the absence of recent intervention. Bronchopleural fistula is less likely but not excluded.  Right IJ  Swan-Ganz catheter terminates in the proximal right upper lobe pulmonary artery. Enteric balloon pump with radiopaque marker in the proximal descending thoracic aortic arch.   Electronically Signed   By: Julian Hy M.D.   On: 09/19/2014 12:27   Dg Chest Port 1v Same Day  09/19/2014   CLINICAL DATA:  Followup pneumonia.  EXAM: PORTABLE CHEST - 1 VIEW SAME DAY  COMPARISON:  09/16/2014  FINDINGS: New right internal jugular Swan-Ganz catheter has its tip in the peripheral right pulmonary artery. No pneumothorax.  Right PICC is stable with its tip in the lower superior vena cava.  Confluent opacity lies in the right mid lung mildly increased in overall extent from the prior study. There is adjacent hazy airspace interstitial opacities extend to the right lung base, also mildly increased from the prior study. Additional opacity partly obscures the right hemidiaphragm which is consistent with pleural fluid. This has mildly increased  Left lung remains clear.  There is no pulmonary edema.  Cardiac silhouette is mildly enlarged. No mediastinal or hilar masses.  IMPRESSION: 1. There is been mild worsening with the areas of airspace opacity in the right mid to lower lung mild increased since the prior exam. Has also been an in the amount of right pleural fluid. 2. New right internal jugular Swan-Ganz catheter tip projects in the peripheral right pulmonary artery. 3. No pneumothorax.  No other change.   Electronically Signed   By: Lajean Manes M.D.   On: 09/19/2014 09:29     I have independently reviewed the above radiologic studies.  Recent Lab Findings: Lab Results  Component Value Date   WBC 12.6* 09/20/2014   HGB 9.8* 09/20/2014   HCT 32.6* 09/20/2014   PLT 394 09/20/2014   GLUCOSE 127* 09/20/2014   CHOL 132 09/16/2014   TRIG 45 09/16/2014   HDL 51 09/16/2014   LDLCALC 72  09/16/2014   ALT 62* 09/16/2014   AST 46* 09/16/2014   NA 134* 09/20/2014   K 4.0 09/20/2014   CL 91* 09/20/2014   CREATININE 0.88 09/20/2014   BUN 11 09/20/2014   CO2 36* 09/20/2014   TSH 2.341 08/27/2014   INR 1.28 09/20/2014   HGBA1C 6.8* 09/16/2014      Assessment / Plan:     Severe ischemic cardiomyopathy, acute on chronic systolic heart failure status post anterior MI December 2014 now on balloon pump and pressors for cardiogenic shock  One month status post pulmonary emboli with pulmonary infarction of right middle lobe-history of bilateral DVT status post IVC filter. Most recent CT scan shows reduced clot burden in pulmonary arteries.  History of atrial and ventricular arrhythmias on LifeVest  Ankylosing spondylitis with restrictive lung disease   The patient has been fully evaluated and found to be appropriate candidate for  DT implantation of HeartMate 2 LVAD. I discussed the procedure, benefits, alternatives, and expected postoperative recovery in detail with the patient and his family. I discussed the risks of the operation to include the risk of stroke, bleeding, transfusion requirement, postoperative multisystem failure, postoperative pulmonary problems including pleural effusion or empyema, and death. He demonstrates his understanding of these issues and agrees to proceed with surgery underwent I feel was an informed consent.   @ME1 @ 09/20/2014 6:10 PM

## 2014-09-21 ENCOUNTER — Encounter (HOSPITAL_COMMUNITY)
Admission: AD | Disposition: A | Discharge status: Home Health | Payer: Self-pay | Source: Ambulatory Visit | Attending: Cardiothoracic Surgery

## 2014-09-21 ENCOUNTER — Inpatient Hospital Stay (HOSPITAL_COMMUNITY): Payer: Medicare Other | Admitting: Certified Registered Nurse Anesthetist

## 2014-09-21 ENCOUNTER — Inpatient Hospital Stay (HOSPITAL_COMMUNITY): Payer: Medicare Other

## 2014-09-21 ENCOUNTER — Encounter (HOSPITAL_COMMUNITY)
Admission: AD | Disposition: A | Discharge status: Home Health | Payer: Medicare Other | Source: Ambulatory Visit | Attending: Cardiothoracic Surgery

## 2014-09-21 ENCOUNTER — Other Ambulatory Visit: Payer: Self-pay

## 2014-09-21 DIAGNOSIS — I509 Heart failure, unspecified: Secondary | ICD-10-CM

## 2014-09-21 HISTORY — PX: TEE WITHOUT CARDIOVERSION: SHX5443

## 2014-09-21 HISTORY — PX: INSERTION OF IMPLANTABLE LEFT VENTRICULAR ASSIST DEVICE: SHX5866

## 2014-09-21 LAB — POCT I-STAT, CHEM 8
BUN: 24 mg/dL — AB (ref 6–23)
BUN: 22 mg/dL (ref 6–23)
BUN: 21 mg/dL (ref 6–23)
BUN: 23 mg/dL (ref 6–23)
BUN: 23 mg/dL (ref 6–23)
BUN: 22 mg/dL (ref 6–23)
CALCIUM ION: 1.02 mmol/L — AB (ref 1.13–1.30)
CHLORIDE: 90 mmol/L — AB (ref 96–112)
CHLORIDE: 91 mmol/L — AB (ref 96–112)
CHLORIDE: 88 mmol/L — AB (ref 96–112)
CHLORIDE: 89 mmol/L — AB (ref 96–112)
CHLORIDE: 91 mmol/L — AB (ref 96–112)
CREATININE: 1 mg/dL (ref 0.50–1.35)
Calcium, Ion: 1.09 mmol/L — ABNORMAL LOW (ref 1.13–1.30)
Calcium, Ion: 1.08 mmol/L — ABNORMAL LOW (ref 1.13–1.30)
Calcium, Ion: 0.98 mmol/L — ABNORMAL LOW (ref 1.13–1.30)
Calcium, Ion: 1.18 mmol/L (ref 1.13–1.30)
Calcium, Ion: 1.13 mmol/L (ref 1.13–1.30)
Chloride: 94 mmol/L — ABNORMAL LOW (ref 96–112)
Creatinine, Ser: 1.1 mg/dL (ref 0.50–1.35)
Creatinine, Ser: 0.9 mg/dL (ref 0.50–1.35)
Creatinine, Ser: 1 mg/dL (ref 0.50–1.35)
Creatinine, Ser: 1 mg/dL (ref 0.50–1.35)
Creatinine, Ser: 1 mg/dL (ref 0.50–1.35)
GLUCOSE: 174 mg/dL — AB (ref 70–99)
GLUCOSE: 144 mg/dL — AB (ref 70–99)
Glucose, Bld: 129 mg/dL — ABNORMAL HIGH (ref 70–99)
Glucose, Bld: 196 mg/dL — ABNORMAL HIGH (ref 70–99)
Glucose, Bld: 212 mg/dL — ABNORMAL HIGH (ref 70–99)
Glucose, Bld: 108 mg/dL — ABNORMAL HIGH (ref 70–99)
HCT: 36 % — ABNORMAL LOW (ref 39.0–52.0)
HCT: 30 % — ABNORMAL LOW (ref 39.0–52.0)
HCT: 27 % — ABNORMAL LOW (ref 39.0–52.0)
HEMATOCRIT: 34 % — AB (ref 39.0–52.0)
HEMATOCRIT: 28 % — AB (ref 39.0–52.0)
HEMATOCRIT: 29 % — AB (ref 39.0–52.0)
Hemoglobin: 12.2 g/dL — ABNORMAL LOW (ref 13.0–17.0)
Hemoglobin: 11.6 g/dL — ABNORMAL LOW (ref 13.0–17.0)
Hemoglobin: 10.2 g/dL — ABNORMAL LOW (ref 13.0–17.0)
Hemoglobin: 9.2 g/dL — ABNORMAL LOW (ref 13.0–17.0)
Hemoglobin: 9.5 g/dL — ABNORMAL LOW (ref 13.0–17.0)
Hemoglobin: 9.9 g/dL — ABNORMAL LOW (ref 13.0–17.0)
POTASSIUM: 4.1 mmol/L (ref 3.5–5.1)
POTASSIUM: 4.1 mmol/L (ref 3.5–5.1)
Potassium: 5.5 mmol/L — ABNORMAL HIGH (ref 3.5–5.1)
Potassium: 4.8 mmol/L (ref 3.5–5.1)
Potassium: 4.3 mmol/L (ref 3.5–5.1)
Potassium: 4.2 mmol/L (ref 3.5–5.1)
SODIUM: 136 mmol/L (ref 135–145)
Sodium: 132 mmol/L — ABNORMAL LOW (ref 135–145)
Sodium: 134 mmol/L — ABNORMAL LOW (ref 135–145)
Sodium: 132 mmol/L — ABNORMAL LOW (ref 135–145)
Sodium: 132 mmol/L — ABNORMAL LOW (ref 135–145)
Sodium: 133 mmol/L — ABNORMAL LOW (ref 135–145)
TCO2: 34 mmol/L (ref 0–100)
TCO2: 30 mmol/L (ref 0–100)
TCO2: 29 mmol/L (ref 0–100)
TCO2: 28 mmol/L (ref 0–100)
TCO2: 27 mmol/L (ref 0–100)
TCO2: 31 mmol/L (ref 0–100)

## 2014-09-21 LAB — CBC
HCT: 35 % — ABNORMAL LOW (ref 39.0–52.0)
HCT: 25.7 % — ABNORMAL LOW (ref 39.0–52.0)
Hemoglobin: 10.7 g/dL — ABNORMAL LOW (ref 13.0–17.0)
Hemoglobin: 8.2 g/dL — ABNORMAL LOW (ref 13.0–17.0)
MCH: 28.2 pg (ref 26.0–34.0)
MCH: 27.9 pg (ref 26.0–34.0)
MCHC: 30.6 g/dL (ref 30.0–36.0)
MCHC: 31.9 g/dL (ref 30.0–36.0)
MCV: 92.3 fL (ref 78.0–100.0)
MCV: 87.4 fL (ref 78.0–100.0)
Platelets: 374 10*3/uL (ref 150–400)
Platelets: 199 10*3/uL (ref 150–400)
RBC: 3.79 MIL/uL — ABNORMAL LOW (ref 4.22–5.81)
RBC: 2.94 MIL/uL — ABNORMAL LOW (ref 4.22–5.81)
RDW: 17.3 % — ABNORMAL HIGH (ref 11.5–15.5)
RDW: 17.4 % — ABNORMAL HIGH (ref 11.5–15.5)
WBC: 13.3 10*3/uL — ABNORMAL HIGH (ref 4.0–10.5)
WBC: 16.8 10*3/uL — ABNORMAL HIGH (ref 4.0–10.5)

## 2014-09-21 LAB — POCT I-STAT 3, ART BLOOD GAS (G3+)
ACID-BASE EXCESS: 11 mmol/L — AB (ref 0.0–2.0)
ACID-BASE EXCESS: 9 mmol/L — AB (ref 0.0–2.0)
Acid-Base Excess: 8 mmol/L — ABNORMAL HIGH (ref 0.0–2.0)
Acid-Base Excess: 13 mmol/L — ABNORMAL HIGH (ref 0.0–2.0)
Acid-Base Excess: 7 mmol/L — ABNORMAL HIGH (ref 0.0–2.0)
Acid-Base Excess: 9 mmol/L — ABNORMAL HIGH (ref 0.0–2.0)
BICARBONATE: 32.3 meq/L — AB (ref 20.0–24.0)
BICARBONATE: 36.2 meq/L — AB (ref 20.0–24.0)
BICARBONATE: 31.7 meq/L — AB (ref 20.0–24.0)
BICARBONATE: 35.4 meq/L — AB (ref 20.0–24.0)
BICARBONATE: 30.9 meq/L — AB (ref 20.0–24.0)
Bicarbonate: 30.8 mEq/L — ABNORMAL HIGH (ref 20.0–24.0)
O2 SAT: 100 %
O2 SAT: 100 %
O2 Saturation: 100 %
O2 Saturation: 100 %
O2 Saturation: 100 %
O2 Saturation: 100 %
PCO2 ART: 31.4 mmHg — AB (ref 35.0–45.0)
PH ART: 7.549 — AB (ref 7.350–7.450)
PH ART: 7.469 — AB (ref 7.350–7.450)
PH ART: 7.6 — AB (ref 7.350–7.450)
PO2 ART: 363 mmHg — AB (ref 80.0–100.0)
PO2 ART: 290 mmHg — AB (ref 80.0–100.0)
PO2 ART: 177 mmHg — AB (ref 80.0–100.0)
Patient temperature: 37.8
Patient temperature: 37.4
TCO2: 34 mmol/L (ref 0–100)
TCO2: 37 mmol/L (ref 0–100)
TCO2: 33 mmol/L (ref 0–100)
TCO2: 37 mmol/L (ref 0–100)
TCO2: 32 mmol/L (ref 0–100)
TCO2: 32 mmol/L (ref 0–100)
pCO2 arterial: 43.4 mmHg (ref 35.0–45.0)
pCO2 arterial: 41.4 mmHg (ref 35.0–45.0)
pCO2 arterial: 44.5 mmHg (ref 35.0–45.0)
pCO2 arterial: 49 mmHg — ABNORMAL HIGH (ref 35.0–45.0)
pCO2 arterial: 31.9 mmHg — ABNORMAL LOW (ref 35.0–45.0)
pH, Arterial: 7.48 — ABNORMAL HIGH (ref 7.350–7.450)
pH, Arterial: 7.461 — ABNORMAL HIGH (ref 7.350–7.450)
pH, Arterial: 7.595 — ABNORMAL HIGH (ref 7.350–7.450)
pO2, Arterial: 203 mmHg — ABNORMAL HIGH (ref 80.0–100.0)
pO2, Arterial: 295 mmHg — ABNORMAL HIGH (ref 80.0–100.0)
pO2, Arterial: 174 mmHg — ABNORMAL HIGH (ref 80.0–100.0)

## 2014-09-21 LAB — BASIC METABOLIC PANEL
Anion gap: 11 (ref 5–15)
Anion gap: 11 (ref 5–15)
Anion gap: 5 (ref 5–15)
Anion gap: 8 (ref 5–15)
BUN: 14 mg/dL (ref 6–23)
BUN: 16 mg/dL (ref 6–23)
BUN: 18 mg/dL (ref 6–23)
BUN: 19 mg/dL (ref 6–23)
CALCIUM: 8.3 mg/dL — AB (ref 8.4–10.5)
CHLORIDE: 98 mmol/L (ref 96–112)
CO2: 28 mmol/L (ref 19–32)
CO2: 35 mmol/L — AB (ref 19–32)
CO2: 37 mmol/L — ABNORMAL HIGH (ref 19–32)
CO2: 38 mmol/L — AB (ref 19–32)
Calcium: 6.5 mg/dL — ABNORMAL LOW (ref 8.4–10.5)
Calcium: 8.1 mg/dL — ABNORMAL LOW (ref 8.4–10.5)
Calcium: 9.2 mg/dL (ref 8.4–10.5)
Chloride: 78 mmol/L — ABNORMAL LOW (ref 96–112)
Chloride: 85 mmol/L — ABNORMAL LOW (ref 96–112)
Chloride: 97 mmol/L (ref 96–112)
Creatinine, Ser: 0.71 mg/dL (ref 0.50–1.35)
Creatinine, Ser: 1.06 mg/dL (ref 0.50–1.35)
Creatinine, Ser: 1.08 mg/dL (ref 0.50–1.35)
Creatinine, Ser: 1.15 mg/dL (ref 0.50–1.35)
GFR calc Af Amer: 75 mL/min — ABNORMAL LOW (ref >90)
GFR calc Af Amer: 81 mL/min — ABNORMAL LOW (ref >90)
GFR calc Af Amer: 83 mL/min — ABNORMAL LOW (ref >90)
GFR calc Af Amer: >90 mL/min (ref >90)
GFR calc non Af Amer: 70 mL/min — ABNORMAL LOW (ref >90)
GFR calc non Af Amer: 71 mL/min — ABNORMAL LOW (ref >90)
GFR calc non Af Amer: >90 mL/min (ref >90)
GFR, EST NON AFRICAN AMERICAN: 65 mL/min — AB (ref >90)
GLUCOSE: 312 mg/dL — AB (ref 70–99)
Glucose, Bld: 165 mg/dL — ABNORMAL HIGH (ref 70–99)
Glucose, Bld: 219 mg/dL — ABNORMAL HIGH (ref 70–99)
Glucose, Bld: 482 mg/dL — ABNORMAL HIGH (ref 70–99)
Potassium: 2.6 mmol/L — CL (ref 3.5–5.1)
Potassium: 3.9 mmol/L (ref 3.5–5.1)
Potassium: 4.6 mmol/L (ref 3.5–5.1)
Potassium: 5.6 mmol/L — ABNORMAL HIGH (ref 3.5–5.1)
Sodium: 126 mmol/L — ABNORMAL LOW (ref 135–145)
Sodium: 131 mmol/L — ABNORMAL LOW (ref 135–145)
Sodium: 136 mmol/L (ref 135–145)
Sodium: 138 mmol/L (ref 135–145)

## 2014-09-21 LAB — CARBOXYHEMOGLOBIN
Carboxyhemoglobin: 1.2 % (ref 0.5–1.5)
Carboxyhemoglobin: 1.4 % (ref 0.5–1.5)
Carboxyhemoglobin: 1.6 % — ABNORMAL HIGH (ref 0.5–1.5)
METHEMOGLOBIN: 0.9 % (ref 0.0–1.5)
Methemoglobin: 0.9 % (ref 0.0–1.5)
Methemoglobin: 1 % (ref 0.0–1.5)
O2 Saturation: 51.2 %
O2 Saturation: 57.3 %
O2 Saturation: 84.4 %
Total hemoglobin: 10.4 g/dL — ABNORMAL LOW (ref 13.5–18.0)
Total hemoglobin: 10.6 g/dL — ABNORMAL LOW (ref 13.5–18.0)
Total hemoglobin: 7.5 g/dL — ABNORMAL LOW (ref 13.5–18.0)

## 2014-09-21 LAB — FIBRINOGEN: Fibrinogen: 544 mg/dL — ABNORMAL HIGH (ref 204–475)

## 2014-09-21 LAB — HEPARIN LEVEL (UNFRACTIONATED): HEPARIN UNFRACTIONATED: 0.56 [IU]/mL (ref 0.30–0.70)

## 2014-09-21 LAB — HEMOGLOBIN AND HEMATOCRIT, BLOOD
HCT: 24 % — ABNORMAL LOW (ref 39.0–52.0)
Hemoglobin: 7.5 g/dL — ABNORMAL LOW (ref 13.0–17.0)

## 2014-09-21 LAB — GLUCOSE, CAPILLARY
GLUCOSE-CAPILLARY: 65 mg/dL — AB (ref 70–99)
Glucose-Capillary: 372 mg/dL — ABNORMAL HIGH (ref 70–99)
Glucose-Capillary: 64 mg/dL — ABNORMAL LOW (ref 70–99)
Glucose-Capillary: 104 mg/dL — ABNORMAL HIGH (ref 70–99)

## 2014-09-21 LAB — PLATELET COUNT: Platelets: 206 10*3/uL (ref 150–400)

## 2014-09-21 LAB — PROTIME-INR
INR: 1.43 (ref 0.00–1.49)
INR: 1.42 (ref 0.00–1.49)
Prothrombin Time: 17.6 seconds — ABNORMAL HIGH (ref 11.6–15.2)
Prothrombin Time: 17.5 seconds — ABNORMAL HIGH (ref 11.6–15.2)

## 2014-09-21 LAB — MAGNESIUM
Magnesium: 1.5 mg/dL (ref 1.5–2.5)
Magnesium: 2.7 mg/dL — ABNORMAL HIGH (ref 1.5–2.5)

## 2014-09-21 LAB — PREPARE RBC (CROSSMATCH)

## 2014-09-21 LAB — APTT: aPTT: 40 seconds — ABNORMAL HIGH (ref 24–37)

## 2014-09-21 SURGERY — INSERTION OF IMPLANTABLE LEFT VENTRICULAR ASSIST DEVICE
Anesthesia: General | Site: Chest

## 2014-09-21 MED ORDER — FLUCONAZOLE IN SODIUM CHLORIDE 400-0.9 MG/200ML-% IV SOLN
400.0000 mg | Freq: Once | INTRAVENOUS | Status: AC
  Administered 2014-09-22: 400 mg via INTRAVENOUS
  Filled 2014-09-21: qty 200

## 2014-09-21 MED ORDER — AMIODARONE HCL IN DEXTROSE 360-4.14 MG/200ML-% IV SOLN
30.0000 mg/h | INTRAVENOUS | Status: AC
  Administered 2014-09-21 – 2014-09-25 (×9): 30 mg/h via INTRAVENOUS
  Filled 2014-09-21 (×3): qty 200
  Filled 2014-09-21: qty 400
  Filled 2014-09-21 (×14): qty 200

## 2014-09-21 MED ORDER — VECURONIUM BROMIDE 10 MG IV SOLR
INTRAVENOUS | Status: DC | PRN
  Administered 2014-09-21: 10 mg via INTRAVENOUS
  Administered 2014-09-21 (×4): 5 mg via INTRAVENOUS

## 2014-09-21 MED ORDER — HEPARIN SODIUM (PORCINE) 1000 UNIT/ML IJ SOLN
INTRAMUSCULAR | Status: AC
  Filled 2014-09-21: qty 1

## 2014-09-21 MED ORDER — LACTATED RINGERS IV SOLN: INTRAVENOUS | Status: DC

## 2014-09-21 MED ORDER — MORPHINE SULFATE 2 MG/ML IJ SOLN
1.0000 mg | INTRAMUSCULAR | Status: DC | PRN
  Administered 2014-09-21 (×2): 4 mg via INTRAVENOUS
  Filled 2014-09-21: qty 1
  Filled 2014-09-21 (×2): qty 2

## 2014-09-21 MED ORDER — MAGNESIUM SULFATE 4 GM/100ML IV SOLN: 4.0000 g | Freq: Once | INTRAVENOUS | Status: AC

## 2014-09-21 MED ORDER — CETYLPYRIDINIUM CHLORIDE 0.05 % MT LIQD
7.0000 mL | Freq: Four times a day (QID) | OROMUCOSAL | Status: DC
  Administered 2014-09-22 – 2014-09-23 (×6): 7 mL via OROMUCOSAL

## 2014-09-21 MED ORDER — RIFAMPIN 300 MG PO CAPS
600.0000 mg | ORAL_CAPSULE | Freq: Once | ORAL | Status: DC
  Filled 2014-09-21 (×2): qty 2

## 2014-09-21 MED ORDER — HEMOSTATIC AGENTS (NO CHARGE) OPTIME
TOPICAL | Status: DC | PRN
  Administered 2014-09-21: 1 via TOPICAL

## 2014-09-21 MED ORDER — ARTIFICIAL TEARS OP OINT
TOPICAL_OINTMENT | OPHTHALMIC | Status: DC | PRN
  Administered 2014-09-21: 1 via OPHTHALMIC

## 2014-09-21 MED ORDER — ETOMIDATE 2 MG/ML IV SOLN
INTRAVENOUS | Status: AC
  Filled 2014-09-21: qty 10

## 2014-09-21 MED ORDER — ASPIRIN EC 325 MG PO TBEC
325.0000 mg | DELAYED_RELEASE_TABLET | Freq: Every day | ORAL | Status: DC
  Administered 2014-09-22 – 2014-10-04 (×13): 325 mg via ORAL
  Filled 2014-09-21 (×14): qty 1

## 2014-09-21 MED ORDER — PANTOPRAZOLE SODIUM 40 MG PO TBEC: 40.0000 mg | DELAYED_RELEASE_TABLET | Freq: Every day | ORAL | Status: DC

## 2014-09-21 MED ORDER — LACTATED RINGERS IV SOLN
INTRAVENOUS | Status: DC | PRN
  Administered 2014-09-21 (×2): via INTRAVENOUS

## 2014-09-21 MED ORDER — FENTANYL CITRATE 0.05 MG/ML IJ SOLN
INTRAMUSCULAR | Status: AC
  Filled 2014-09-21: qty 5

## 2014-09-21 MED ORDER — PROTAMINE SULFATE 10 MG/ML IV SOLN
INTRAVENOUS | Status: AC
  Filled 2014-09-21: qty 25

## 2014-09-21 MED ORDER — DEXTROSE 5 % IV SOLN
1.5000 g | Freq: Two times a day (BID) | INTRAVENOUS | Status: DC
  Administered 2014-09-22 (×3): 1.5 g via INTRAVENOUS
  Filled 2014-09-21 (×4): qty 1.5

## 2014-09-21 MED ORDER — SODIUM CHLORIDE 0.9 % IV SOLN
INTRAVENOUS | Status: DC
Start: 2014-09-21 — End: 2014-10-05
  Administered 2014-09-22 – 2014-09-23 (×2): 20 mL via INTRAVENOUS

## 2014-09-21 MED ORDER — INSULIN REGULAR BOLUS VIA INFUSION
0.0000 [IU] | Freq: Three times a day (TID) | INTRAVENOUS | Status: DC
  Filled 2014-09-21: qty 10

## 2014-09-21 MED ORDER — VANCOMYCIN HCL 1000 MG IV SOLR
INTRAVENOUS | Status: DC | PRN
  Administered 2014-09-21: 1000 mg via TOPICAL

## 2014-09-21 MED ORDER — NOREPINEPHRINE BITARTRATE 1 MG/ML IV SOLN
0.0000 ug/min | INTRAVENOUS | Status: DC
  Filled 2014-09-21: qty 16

## 2014-09-21 MED ORDER — MIDAZOLAM HCL 5 MG/5ML IJ SOLN
INTRAMUSCULAR | Status: DC | PRN
  Administered 2014-09-21: 6 mg via INTRAVENOUS
  Administered 2014-09-21 (×4): 2 mg via INTRAVENOUS
  Administered 2014-09-21: 3 mg via INTRAVENOUS
  Administered 2014-09-21: 1 mg via INTRAVENOUS
  Administered 2014-09-21: 2 mg via INTRAVENOUS

## 2014-09-21 MED ORDER — NITROGLYCERIN IN D5W 200-5 MCG/ML-% IV SOLN: 0.0000 ug/min | INTRAVENOUS | Status: DC

## 2014-09-21 MED ORDER — AMIODARONE LOAD VIA INFUSION
150.0000 mg | Freq: Once | INTRAVENOUS | Status: AC
  Administered 2014-09-21: 150 mg via INTRAVENOUS
  Filled 2014-09-21: qty 83.34

## 2014-09-21 MED ORDER — METOCLOPRAMIDE HCL 5 MG/ML IJ SOLN
10.0000 mg | Freq: Four times a day (QID) | INTRAMUSCULAR | Status: DC
  Administered 2014-09-21 – 2014-09-27 (×23): 10 mg via INTRAVENOUS
  Filled 2014-09-21 (×29): qty 2

## 2014-09-21 MED ORDER — DOCUSATE SODIUM 100 MG PO CAPS
200.0000 mg | ORAL_CAPSULE | Freq: Every day | ORAL | Status: DC
  Administered 2014-09-22 – 2014-10-01 (×4): 200 mg via ORAL
  Filled 2014-09-21 (×11): qty 2

## 2014-09-21 MED ORDER — MAGNESIUM SULFATE 4 GM/100ML IV SOLN
INTRAVENOUS | Status: AC
  Administered 2014-09-21: 4 g
  Filled 2014-09-21: qty 100

## 2014-09-21 MED ORDER — SODIUM CHLORIDE 0.9 % IJ SOLN: 3.0000 mL | Freq: Two times a day (BID) | INTRAMUSCULAR | Status: DC

## 2014-09-21 MED ORDER — AMIODARONE HCL IN DEXTROSE 360-4.14 MG/200ML-% IV SOLN
60.0000 mg/h | INTRAVENOUS | Status: DC
  Administered 2014-09-21 (×2): 60 mg/h via INTRAVENOUS
  Filled 2014-09-21: qty 200

## 2014-09-21 MED ORDER — FAMOTIDINE IN NACL 20-0.9 MG/50ML-% IV SOLN: 20.0000 mg | Freq: Two times a day (BID) | INTRAVENOUS | Status: DC

## 2014-09-21 MED ORDER — ACETAMINOPHEN 160 MG/5ML PO SOLN: 650.0000 mg | Freq: Once | ORAL | Status: AC

## 2014-09-21 MED ORDER — ACETAMINOPHEN 650 MG RE SUPP
650.0000 mg | Freq: Once | RECTAL | Status: AC
  Administered 2014-09-21: 650 mg via RECTAL

## 2014-09-21 MED ORDER — MILRINONE IN DEXTROSE 20 MG/100ML IV SOLN
0.3750 ug/kg/min | INTRAVENOUS | Status: DC
  Filled 2014-09-21: qty 100

## 2014-09-21 MED ORDER — PROTAMINE SULFATE 10 MG/ML IV SOLN
INTRAVENOUS | Status: DC | PRN
Start: 2014-09-21 — End: 2014-09-21
  Administered 2014-09-21: 290 mg via INTRAVENOUS
  Administered 2014-09-21: 10 mg via INTRAVENOUS
  Administered 2014-09-21: 50 mg via INTRAVENOUS

## 2014-09-21 MED ORDER — SODIUM CHLORIDE 0.9 % IR SOLN
Status: DC | PRN
  Administered 2014-09-21: 1000 mL

## 2014-09-21 MED ORDER — DOPAMINE-DEXTROSE 3.2-5 MG/ML-% IV SOLN
0.0000 ug/kg/min | INTRAVENOUS | Status: DC
  Administered 2014-09-22: 3 ug/kg/min via INTRAVENOUS
  Filled 2014-09-21: qty 250

## 2014-09-21 MED ORDER — CALCIUM GLUCONATE 10 % IV SOLN
1.0000 g | Freq: Once | INTRAVENOUS | Status: AC
  Administered 2014-09-21: 1 g via INTRAVENOUS
  Filled 2014-09-21: qty 10

## 2014-09-21 MED ORDER — INSULIN ASPART 100 UNIT/ML ~~LOC~~ SOLN: 0.0000 [IU] | SUBCUTANEOUS | Status: DC

## 2014-09-21 MED ORDER — SODIUM CHLORIDE 0.45 % IV SOLN: INTRAVENOUS | Status: DC | PRN

## 2014-09-21 MED ORDER — BISACODYL 5 MG PO TBEC
10.0000 mg | DELAYED_RELEASE_TABLET | Freq: Every day | ORAL | Status: DC
Start: 2014-09-22 — End: 2014-10-05
  Administered 2014-09-22 – 2014-10-01 (×4): 10 mg via ORAL
  Filled 2014-09-21 (×4): qty 2

## 2014-09-21 MED ORDER — EPINEPHRINE HCL 1 MG/ML IJ SOLN
0.0000 ug/min | INTRAVENOUS | Status: AC
  Filled 2014-09-21: qty 4

## 2014-09-21 MED ORDER — DEXTROSE 50 % IV SOLN
INTRAVENOUS | Status: AC
  Administered 2014-09-21: 50 mL
  Filled 2014-09-21: qty 50

## 2014-09-21 MED ORDER — SODIUM CHLORIDE 0.9 % IJ SOLN: 3.0000 mL | INTRAMUSCULAR | Status: DC | PRN

## 2014-09-21 MED ORDER — MAGNESIUM SULFATE 4 GM/100ML IV SOLN
4.0000 g | Freq: Once | INTRAVENOUS | Status: AC
  Administered 2014-09-21: 4 g via INTRAVENOUS
  Filled 2014-09-21: qty 100

## 2014-09-21 MED ORDER — BISACODYL 10 MG RE SUPP: 10.0000 mg | Freq: Every day | RECTAL | Status: DC

## 2014-09-21 MED ORDER — ASPIRIN 81 MG PO CHEW
324.0000 mg | CHEWABLE_TABLET | Freq: Every day | ORAL | Status: DC
  Filled 2014-09-21: qty 4

## 2014-09-21 MED ORDER — MIDAZOLAM HCL 10 MG/2ML IJ SOLN
INTRAMUSCULAR | Status: AC
  Filled 2014-09-21: qty 2

## 2014-09-21 MED ORDER — POTASSIUM CHLORIDE CRYS ER 20 MEQ PO TBCR
40.0000 meq | EXTENDED_RELEASE_TABLET | Freq: Once | ORAL | Status: AC
Start: 2014-09-21 — End: 2014-09-21
  Administered 2014-09-21: 40 meq via ORAL
  Filled 2014-09-21: qty 2

## 2014-09-21 MED ORDER — SODIUM CHLORIDE 0.9 % IV SOLN: 10.0000 mL/h | Freq: Once | INTRAVENOUS | Status: DC

## 2014-09-21 MED ORDER — SODIUM CHLORIDE 0.9 % IV SOLN
INTRAVENOUS | Status: DC
  Administered 2014-09-21: 1 [IU]/h via INTRAVENOUS
  Administered 2014-09-22: 0.3 [IU]/h via INTRAVENOUS
  Filled 2014-09-21 (×2): qty 2.5

## 2014-09-21 MED ORDER — SUCCINYLCHOLINE CHLORIDE 20 MG/ML IJ SOLN
INTRAMUSCULAR | Status: AC
  Filled 2014-09-21: qty 1

## 2014-09-21 MED ORDER — EPINEPHRINE HCL 0.1 MG/ML IJ SOSY
PREFILLED_SYRINGE | INTRAMUSCULAR | Status: AC
  Filled 2014-09-21: qty 10

## 2014-09-21 MED ORDER — ACETAMINOPHEN 160 MG/5ML PO SOLN
1000.0000 mg | Freq: Four times a day (QID) | ORAL | Status: AC
  Administered 2014-09-21 – 2014-09-22 (×2): 1000 mg
  Filled 2014-09-21 (×2): qty 40.6

## 2014-09-21 MED ORDER — LACTATED RINGERS IV SOLN
INTRAVENOUS | Status: DC
  Administered 2014-09-22 (×2): 20 mL via INTRAVENOUS
  Administered 2014-09-22: 10:00:00 via INTRAVENOUS
  Administered 2014-09-23 (×2): 20 mL via INTRAVENOUS
  Administered 2014-09-25: 02:00:00 via INTRAVENOUS

## 2014-09-21 MED ORDER — ALBUMIN HUMAN 5 % IV SOLN
INTRAVENOUS | Status: DC | PRN
  Administered 2014-09-21: 14:00:00 via INTRAVENOUS

## 2014-09-21 MED ORDER — HEPARIN SODIUM (PORCINE) 1000 UNIT/ML IJ SOLN
INTRAMUSCULAR | Status: DC | PRN
  Administered 2014-09-21: 20000 [IU] via INTRAVENOUS
  Administered 2014-09-21 (×2): 5000 [IU] via INTRAVENOUS

## 2014-09-21 MED ORDER — CHLORHEXIDINE GLUCONATE 0.12 % MT SOLN
15.0000 mL | Freq: Two times a day (BID) | OROMUCOSAL | Status: DC
  Administered 2014-09-21 – 2014-09-23 (×4): 15 mL via OROMUCOSAL
  Filled 2014-09-21 (×4): qty 15

## 2014-09-21 MED ORDER — POTASSIUM CHLORIDE 10 MEQ/50ML IV SOLN: 10.0000 meq | INTRAVENOUS | Status: AC

## 2014-09-21 MED ORDER — MIDAZOLAM HCL 2 MG/2ML IJ SOLN
2.0000 mg | INTRAMUSCULAR | Status: DC | PRN
  Administered 2014-09-21 (×3): 2 mg via INTRAVENOUS
  Filled 2014-09-21 (×4): qty 2

## 2014-09-21 MED ORDER — OXYCODONE HCL 5 MG PO TABS
5.0000 mg | ORAL_TABLET | ORAL | Status: DC | PRN
  Administered 2014-09-22: 10 mg via ORAL
  Filled 2014-09-21: qty 2

## 2014-09-21 MED ORDER — ALBUMIN HUMAN 5 % IV SOLN
250.0000 mL | INTRAVENOUS | Status: AC | PRN
  Administered 2014-09-21 – 2014-09-22 (×6): 250 mL via INTRAVENOUS
  Filled 2014-09-21 (×4): qty 250

## 2014-09-21 MED ORDER — HEMOSTATIC AGENTS (NO CHARGE) OPTIME
TOPICAL | Status: DC | PRN
  Administered 2014-09-21: 3 via TOPICAL

## 2014-09-21 MED ORDER — MORPHINE SULFATE 2 MG/ML IJ SOLN
2.0000 mg | INTRAMUSCULAR | Status: DC | PRN
  Administered 2014-09-22 (×2): 2 mg via INTRAVENOUS
  Filled 2014-09-21 (×2): qty 1

## 2014-09-21 MED ORDER — SODIUM CHLORIDE 0.9 % IV SOLN: 250.0000 mL | INTRAVENOUS | Status: DC

## 2014-09-21 MED ORDER — HEMOSTATIC AGENTS (NO CHARGE) OPTIME
TOPICAL | Status: DC | PRN
  Administered 2014-09-21: 2 via TOPICAL

## 2014-09-21 MED ORDER — LACTATED RINGERS IV SOLN
INTRAVENOUS | Status: DC | PRN
  Administered 2014-09-21 (×2): via INTRAVENOUS

## 2014-09-21 MED ORDER — DEXMEDETOMIDINE HCL IN NACL 400 MCG/100ML IV SOLN: 0.1000 ug/kg/h | INTRAVENOUS | Status: DC

## 2014-09-21 MED ORDER — ASPIRIN 300 MG RE SUPP
300.0000 mg | Freq: Every day | RECTAL | Status: DC
  Filled 2014-09-21 (×2): qty 1

## 2014-09-21 MED ORDER — AMIODARONE HCL IN DEXTROSE 360-4.14 MG/200ML-% IV SOLN
INTRAVENOUS | Status: AC
  Administered 2014-09-21: 60 mg/h via INTRAVENOUS
  Filled 2014-09-21: qty 200

## 2014-09-21 MED ORDER — SODIUM CHLORIDE 0.9 % IJ SOLN
INTRAMUSCULAR | Status: AC
  Filled 2014-09-21: qty 10

## 2014-09-21 MED ORDER — ACETAMINOPHEN 10 MG/ML IV SOLN
1000.0000 mg | Freq: Once | INTRAVENOUS | Status: AC
  Administered 2014-09-21: 1000 mg via INTRAVENOUS
  Filled 2014-09-21: qty 100

## 2014-09-21 MED ORDER — POTASSIUM CHLORIDE CRYS ER 20 MEQ PO TBCR
40.0000 meq | EXTENDED_RELEASE_TABLET | ORAL | Status: AC
  Administered 2014-09-21: 40 meq via ORAL
  Filled 2014-09-21: qty 2

## 2014-09-21 MED ORDER — ACETAMINOPHEN 500 MG PO TABS
1000.0000 mg | ORAL_TABLET | Freq: Four times a day (QID) | ORAL | Status: AC
  Administered 2014-09-22 – 2014-09-26 (×14): 1000 mg via ORAL
  Filled 2014-09-21 (×20): qty 2

## 2014-09-21 MED ORDER — AMIODARONE HCL IN DEXTROSE 360-4.14 MG/200ML-% IV SOLN
60.0000 mg/h | INTRAVENOUS | Status: AC
  Administered 2014-09-21: 30 mg/h via INTRAVENOUS
  Filled 2014-09-21: qty 200

## 2014-09-21 MED ORDER — VANCOMYCIN HCL IN DEXTROSE 1-5 GM/200ML-% IV SOLN
1000.0000 mg | Freq: Two times a day (BID) | INTRAVENOUS | Status: DC
  Administered 2014-09-21 – 2014-09-22 (×2): 1000 mg via INTRAVENOUS
  Filled 2014-09-21 (×3): qty 200

## 2014-09-21 MED ORDER — ROCURONIUM BROMIDE 100 MG/10ML IV SOLN
INTRAVENOUS | Status: DC | PRN
Start: 2014-09-21 — End: 2014-09-21
  Administered 2014-09-21: 50 mg via INTRAVENOUS

## 2014-09-21 MED ORDER — NOREPINEPHRINE BITARTRATE 1 MG/ML IV SOLN
0.0000 ug/min | INTRAVENOUS | Status: DC
Start: 2014-09-21 — End: 2014-09-21
  Filled 2014-09-21: qty 16

## 2014-09-21 MED ORDER — ETOMIDATE 2 MG/ML IV SOLN
INTRAVENOUS | Status: DC | PRN
  Administered 2014-09-21: 18 mg via INTRAVENOUS

## 2014-09-21 MED ORDER — FENTANYL CITRATE 0.05 MG/ML IJ SOLN
INTRAMUSCULAR | Status: DC | PRN
  Administered 2014-09-21: 250 ug via INTRAVENOUS
  Administered 2014-09-21: 50 ug via INTRAVENOUS
  Administered 2014-09-21: 100 ug via INTRAVENOUS

## 2014-09-21 MED ORDER — EPHEDRINE SULFATE 50 MG/ML IJ SOLN
INTRAMUSCULAR | Status: AC
  Filled 2014-09-21: qty 1

## 2014-09-21 MED ORDER — DEXMEDETOMIDINE HCL IN NACL 200 MCG/50ML IV SOLN
0.4000 ug/kg/h | INTRAVENOUS | Status: DC
  Administered 2014-09-21 (×2): 0.7 ug/kg/h via INTRAVENOUS
  Filled 2014-09-21 (×3): qty 50

## 2014-09-21 MED ORDER — PHENYLEPHRINE HCL 10 MG/ML IJ SOLN
INTRAMUSCULAR | Status: DC | PRN
  Administered 2014-09-21 (×2): 40 ug via INTRAVENOUS

## 2014-09-21 MED ORDER — ALUM & MAG HYDROXIDE-SIMETH 200-200-20 MG/5ML PO SUSP
30.0000 mL | Freq: Four times a day (QID) | ORAL | Status: DC | PRN
  Administered 2014-09-21: 30 mL via ORAL
  Filled 2014-09-21: qty 30

## 2014-09-21 MED ORDER — SODIUM CHLORIDE 0.9 % IV SOLN
INTRAVENOUS | Status: DC
  Filled 2014-09-21: qty 2.5

## 2014-09-21 MED ORDER — LACTATED RINGERS IV SOLN: 500.0000 mL | Freq: Once | INTRAVENOUS | Status: AC | PRN

## 2014-09-21 MED ORDER — AMIODARONE HCL IN DEXTROSE 360-4.14 MG/200ML-% IV SOLN
30.0000 mg/h | INTRAVENOUS | Status: DC
  Filled 2014-09-21 (×2): qty 200

## 2014-09-21 MED ORDER — AMIODARONE HCL IN DEXTROSE 360-4.14 MG/200ML-% IV SOLN: 60.0000 mg/h | INTRAVENOUS | Status: DC

## 2014-09-21 MED ORDER — EPHEDRINE SULFATE 50 MG/ML IJ SOLN
INTRAMUSCULAR | Status: DC | PRN
  Administered 2014-09-21: 5 mg via INTRAVENOUS

## 2014-09-21 MED ORDER — SODIUM CHLORIDE 0.45 % IV SOLN: INTRAVENOUS | Status: DC

## 2014-09-21 MED ORDER — DEXTROSE 5 % IV SOLN
0.5000 ug/min | INTRAVENOUS | Status: DC
  Administered 2014-09-22 (×2): 4 ug/min via INTRAVENOUS
  Filled 2014-09-21 (×2): qty 4

## 2014-09-21 MED ORDER — POTASSIUM CHLORIDE 10 MEQ/50ML IV SOLN
10.0000 meq | INTRAVENOUS | Status: AC
  Administered 2014-09-21 (×2): 10 meq via INTRAVENOUS
  Filled 2014-09-21: qty 50

## 2014-09-21 MED ORDER — POTASSIUM CHLORIDE 10 MEQ/50ML IV SOLN
10.0000 meq | INTRAVENOUS | Status: DC
Start: 2014-09-21 — End: 2014-09-21
  Administered 2014-09-21: 10 meq via INTRAVENOUS
  Filled 2014-09-21: qty 50

## 2014-09-21 MED ORDER — ARTIFICIAL TEARS OP OINT
TOPICAL_OINTMENT | OPHTHALMIC | Status: AC
  Filled 2014-09-21: qty 3.5

## 2014-09-21 MED ORDER — ONDANSETRON HCL 4 MG/2ML IJ SOLN
4.0000 mg | Freq: Four times a day (QID) | INTRAMUSCULAR | Status: DC | PRN
Start: 2014-09-21 — End: 2014-10-05

## 2014-09-21 MED ORDER — FAMOTIDINE IN NACL 20-0.9 MG/50ML-% IV SOLN
20.0000 mg | Freq: Two times a day (BID) | INTRAVENOUS | Status: AC
  Administered 2014-09-21 – 2014-09-22 (×2): 20 mg via INTRAVENOUS
  Filled 2014-09-21: qty 50

## 2014-09-21 SURGICAL SUPPLY — 110 items
ADAPTER CARDIO PERF ANTE/RETRO (ADAPTER) IMPLANT
ADAPTER DLP PERFUSION .25INX2I (MISCELLANEOUS) ×8 IMPLANT
ANTEGRADE CPLG (MISCELLANEOUS) IMPLANT
ATTRACTOMAT 16X20 MAGNETIC DRP (DRAPES) IMPLANT
BAG DECANTER FOR FLEXI CONT (MISCELLANEOUS) ×12 IMPLANT
BLADE STERNUM SYSTEM 6 (BLADE) ×4 IMPLANT
BLADE SURG 12 STRL SS (BLADE) ×4 IMPLANT
BLADE SURG 15 STRL LF DISP TIS (BLADE) ×2 IMPLANT
BLADE SURG 15 STRL SS (BLADE) ×2
CANISTER SUCTION 2500CC (MISCELLANEOUS) ×4 IMPLANT
CANNULA ARTERIAL NVNT 3/8 20FR (MISCELLANEOUS) ×4 IMPLANT
CANNULA SUMP PERICARDIAL (CANNULA) ×4 IMPLANT
CANNULA VENOUS LOW PROF 34X46 (CANNULA) ×4 IMPLANT
CATH CPB KIT VANTRIGT (MISCELLANEOUS) IMPLANT
CATH FOLEY 2WAY SLVR  5CC 14FR (CATHETERS) ×2
CATH FOLEY 2WAY SLVR 5CC 14FR (CATHETERS) ×2 IMPLANT
CATH HEART VENT LEFT (CATHETERS) IMPLANT
CATH HYDRAGLIDE XL THORACIC (CATHETERS) ×4 IMPLANT
CATH ROBINSON RED A/P 18FR (CATHETERS) ×8 IMPLANT
CATH THORACIC 28FR (CATHETERS) IMPLANT
CATH THORACIC 36FR RT ANG (CATHETERS) ×12 IMPLANT
CHLORAPREP W/TINT 26ML (MISCELLANEOUS) ×4 IMPLANT
CONN ST 1/4X3/8  BEN (MISCELLANEOUS)
CONN ST 1/4X3/8 BEN (MISCELLANEOUS) IMPLANT
COVER SURGICAL LIGHT HANDLE (MISCELLANEOUS) ×4 IMPLANT
CRADLE DONUT ADULT HEAD (MISCELLANEOUS) ×4 IMPLANT
DRAIN CHANNEL 28F RND 3/8 FF (WOUND CARE) IMPLANT
DRAIN CHANNEL 32F RND 10.7 FF (WOUND CARE) IMPLANT
DRAPE BILATERAL SPLIT (DRAPES) ×4 IMPLANT
DRAPE CV SPLIT W-CLR ANES SCRN (DRAPES) ×4 IMPLANT
DRAPE INCISE IOBAN 66X45 STRL (DRAPES) ×4 IMPLANT
DRAPE SLUSH/WARMER DISC (DRAPES) ×4 IMPLANT
DRSG AQUACEL AG ADV 3.5X14 (GAUZE/BANDAGES/DRESSINGS) ×4 IMPLANT
DRSG OPSITE POSTOP 4X8 (GAUZE/BANDAGES/DRESSINGS) ×4 IMPLANT
ELECT BLADE 4.0 EZ CLEAN MEGAD (MISCELLANEOUS) ×4
ELECT BLADE 6.5 EXT (BLADE) ×4 IMPLANT
ELECT CAUTERY BLADE 6.4 (BLADE) ×4 IMPLANT
ELECT REM PT RETURN 9FT ADLT (ELECTROSURGICAL) ×4
ELECTRODE BLDE 4.0 EZ CLN MEGD (MISCELLANEOUS) ×2 IMPLANT
ELECTRODE REM PT RTRN 9FT ADLT (ELECTROSURGICAL) ×2 IMPLANT
FELT TEFLON 6X6 (MISCELLANEOUS) ×4 IMPLANT
GAUZE SPONGE 4X4 12PLY STRL (GAUZE/BANDAGES/DRESSINGS) ×8 IMPLANT
GLOVE BIO SURGEON STRL SZ7.5 (GLOVE) ×12 IMPLANT
GOWN STRL REUS W/ TWL LRG LVL3 (GOWN DISPOSABLE) ×8 IMPLANT
GOWN STRL REUS W/ TWL XL LVL3 (GOWN DISPOSABLE) ×4 IMPLANT
GOWN STRL REUS W/TWL LRG LVL3 (GOWN DISPOSABLE) ×8
GOWN STRL REUS W/TWL XL LVL3 (GOWN DISPOSABLE) ×4
HEMOSTAT POWDER SURGIFOAM 1G (HEMOSTASIS) ×16 IMPLANT
HEMOSTAT SURGICEL 2X14 (HEMOSTASIS) ×4 IMPLANT
HM II APICAL SEWING RING (Prosthesis & Implant Heart) ×4 IMPLANT
HM II SEALED INFLOW CONDUIT (Prosthesis & Implant Heart) ×4 IMPLANT
HM II SEALED OUTFLOW GRAFT (Prosthesis & Implant Heart) ×4 IMPLANT
HM II VENTR. ASSIST DEVICE BP (Prosthesis & Implant Heart) ×4 IMPLANT
INSERT FOGARTY XLG (MISCELLANEOUS) IMPLANT
KIT BASIN OR (CUSTOM PROCEDURE TRAY) ×4 IMPLANT
KIT ROOM TURNOVER OR (KITS) ×4 IMPLANT
KIT SUCTION CATH 14FR (SUCTIONS) ×4 IMPLANT
LEAD PACING MYOCARDI (MISCELLANEOUS) ×4 IMPLANT
NS IRRIG 1000ML POUR BTL (IV SOLUTION) ×20 IMPLANT
PACK OPEN HEART (CUSTOM PROCEDURE TRAY) ×4 IMPLANT
PAD ARMBOARD 7.5X6 YLW CONV (MISCELLANEOUS) ×8 IMPLANT
PAD DEFIB R2 (MISCELLANEOUS) ×4 IMPLANT
PUNCH AORTIC ROTATE 4.0MM (MISCELLANEOUS) ×4 IMPLANT
PUNCH AORTIC ROTATE 4.5MM 8IN (MISCELLANEOUS) ×4 IMPLANT
SEALANT SURG COSEAL 8ML (VASCULAR PRODUCTS) ×4 IMPLANT
SHEATH AVANTI 11CM 5FR (MISCELLANEOUS) ×4 IMPLANT
SPONGE GAUZE 4X4 12PLY STER LF (GAUZE/BANDAGES/DRESSINGS) ×8 IMPLANT
SPONGE LAP 18X18 X RAY DECT (DISPOSABLE) ×8 IMPLANT
STOPCOCK 4 WAY LG BORE MALE ST (IV SETS) ×4 IMPLANT
SUCKER INTRACARDIAC WEIGHTED (SUCKER) ×4 IMPLANT
SUT ETHIBOND 2 0 SH (SUTURE) ×10
SUT ETHIBOND 2 0 SH 36X2 (SUTURE) ×10 IMPLANT
SUT ETHIBOND 5 LR DA (SUTURE) ×8 IMPLANT
SUT ETHIBOND NAB MH 2-0 36IN (SUTURE) ×72 IMPLANT
SUT PROLENE 3 0 RB 1 (SUTURE) IMPLANT
SUT PROLENE 3 0 SH DA (SUTURE) ×8 IMPLANT
SUT PROLENE 4 0 RB 1 (SUTURE) ×8
SUT PROLENE 4-0 RB1 .5 CRCL 36 (SUTURE) ×8 IMPLANT
SUT PROLENE 5 0 C 1 36 (SUTURE) ×8 IMPLANT
SUT PROLENE 5 0 C1 (SUTURE) ×4 IMPLANT
SUT PROLENE 6 0 C 1 30 (SUTURE) ×24 IMPLANT
SUT SILK  1 MH (SUTURE) ×12
SUT SILK 1 MH (SUTURE) ×12 IMPLANT
SUT SILK 1 TIES 10X30 (SUTURE) ×4 IMPLANT
SUT SILK 2 0 SH CR/8 (SUTURE) ×12 IMPLANT
SUT SILK 3 0 SH CR/8 (SUTURE) ×4 IMPLANT
SUT STEEL 6MS V (SUTURE) ×8 IMPLANT
SUT STEEL SZ 6 DBL 3X14 BALL (SUTURE) ×4 IMPLANT
SUT TEM PAC WIRE 2 0 SH (SUTURE) ×8 IMPLANT
SUT VIC AB 1 CTX 18 (SUTURE) ×8 IMPLANT
SUT VIC AB 1 CTX 36 (SUTURE) ×4
SUT VIC AB 1 CTX36XBRD ANBCTR (SUTURE) ×4 IMPLANT
SUT VIC AB 2-0 CTX 27 (SUTURE) ×8 IMPLANT
SUT VIC AB 3-0 SH 8-18 (SUTURE) ×8 IMPLANT
SUT VIC AB 3-0 X1 27 (SUTURE) ×8 IMPLANT
SUT VICRYL 2 TP 1 (SUTURE) ×4 IMPLANT
SYR 50ML LL SCALE MARK (SYRINGE) ×4 IMPLANT
SYSTEM SAHARA CHEST DRAIN ATS (WOUND CARE) ×4 IMPLANT
TAPE CLOTH SURG 6X10 WHT LF (GAUZE/BANDAGES/DRESSINGS) ×4 IMPLANT
TAPE PAPER 3X10 WHT MICROPORE (GAUZE/BANDAGES/DRESSINGS) ×4 IMPLANT
TOWEL OR 17X24 6PK STRL BLUE (TOWEL DISPOSABLE) ×8 IMPLANT
TOWEL OR 17X26 10 PK STRL BLUE (TOWEL DISPOSABLE) ×8 IMPLANT
TRAY CATH LUMEN 1 20CM STRL (SET/KITS/TRAYS/PACK) ×4 IMPLANT
TRAY FOLEY IC TEMP SENS 14FR (CATHETERS) ×4 IMPLANT
TUBE CONNECTING 12'X1/4 (SUCTIONS) ×1
TUBE CONNECTING 12X1/4 (SUCTIONS) ×3 IMPLANT
UNDERPAD 30X30 INCONTINENT (UNDERPADS AND DIAPERS) ×4 IMPLANT
VENT LEFT HEART 12002 (CATHETERS)
WATER STERILE IRR 1000ML POUR (IV SOLUTION) ×8 IMPLANT
YANKAUER SUCT BULB TIP NO VENT (SUCTIONS) ×4 IMPLANT

## 2014-09-21 NOTE — Progress Notes (Signed)
Hypoglycemic Event  CBG: 64  Treatment: D50 IV 25 mL  Symptoms: None  Follow-up CBG: [id number] CBG Result:  Possible Reasons for Event: Unknown  Comments/MD notified:n/a    Kendra Opitz  Remember to initiate Hypoglycemia Order Set & complete

## 2014-09-21 NOTE — Progress Notes (Signed)
TCTS BRIEF SICU PROGRESS NOTE  Day of Surgery  S/P Procedure(s) (LRB): INSERTION OF IMPLANTABLE LEFT VENTRICULAR ASSIST DEVICE (N/A) TRANSESOPHAGEAL ECHOCARDIOGRAM (TEE) (N/A)   Sedated on vent NSR w/ stable VAD flows and MAP  PA pressures and CVP low Chest tube output low UOP excellent  Plan: Continue routine early postop  Scott [name] 09/21/2014 8:10 PM

## 2014-09-21 NOTE — Progress Notes (Signed)
Pocket drain noted to have audible air-leak appearing sound. MD [name] made aware. No new orders- will observe  Scott [name]

## 2014-09-21 NOTE — Anesthesia Preprocedure Evaluation (Addendum)
Anesthesia Evaluation  Patient identified by MRN, date of birth, ID band Patient awake    Reviewed: Allergy & Precautions, NPO status , Patient's Chart, lab work & pertinent test results, reviewed documented beta blocker date and time   Airway Mallampati: II   Neck ROM: full    Dental  (+) Dental Advisory Given   Pulmonary former smoker,  Pleural effusion on right - pt had thoracentesis 3/17, hx Pulmonary embolus 08/2014 s/p filter placement         Cardiovascular hypertension, Pt. on medications and Pt. on home beta blockers + CAD, + Past MI, + Cardiac Stents, + Peripheral Vascular Disease, +CHF and DVT  .CAD (coronary artery disease)   a. stenting of LCx 2013; b. STEMI 06/12/14 s/p PCI to LAD complicated by post cath shock requiring IABP; VT s/p DCCV, EF 20%; c. NSTEMI 06/26/14 treated medically. .Ischemic cardiomyopathy   a. echo 08/23/2014 EF <20%, dilated CM, mod MR/TR       Neuro/Psych    GI/Hepatic   Endo/Other  diabetes, Type 2  Renal/GU      Musculoskeletal Ankylosing spondylitis.   Abdominal   Peds  Hematology   Anesthesia Other Findings   Reproductive/Obstetrics                           Anesthesia Physical Anesthesia Plan  ASA: IV  Anesthesia Plan: General   Post-op Pain Management:    Induction: Intravenous  Airway Management Planned: Oral ETT  Additional Equipment: Arterial line, PA Cath, 3D TEE, CVP and Ultrasound Guidance Line Placement  Intra-op Plan:   Post-operative Plan: Post-operative intubation/ventilation  Informed Consent: I have reviewed the patients History and Physical, chart, labs and discussed the procedure including the risks, benefits and alternatives for the proposed anesthesia with the patient or authorized representative who has indicated his/her understanding and acceptance.   Dental advisory given  Plan Discussed with: Anesthesiologist  and Surgeon  Anesthesia Plan Comments:         Anesthesia Quick Evaluation

## 2014-09-21 NOTE — Progress Notes (Signed)
The patient was examined and preop studies reviewed. There has been no change from the prior exam and the patient is ready for surgery.  Plan implantation of LVAD in D [place]

## 2014-09-21 NOTE — Anesthesia Procedure Notes (Signed)
Procedure Name: Intubation Date/Time: 09/21/2014 8:08 AM Performed by: Judeth Cornfield T Pre-anesthesia Checklist: Patient identified, Timeout performed, Emergency Drugs available, Suction available and Patient being monitored Patient Re-evaluated:Patient Re-evaluated prior to inductionOxygen Delivery Method: Circle system utilized Preoxygenation: Pre-oxygenation with 100% oxygen Intubation Type: IV induction Ventilation: Mask ventilation without difficulty Grade View: Grade I Tube type: Oral Tube size: 7.0 mm Number of attempts: 1 Airway Equipment and Method: Video-laryngoscopy and Stylet Placement Confirmation: CO2 detector,  positive ETCO2,  ETT inserted through vocal cords under direct vision and breath sounds checked- equal and bilateral Secured at: 23 cm Tube secured with: Tape Dental Injury: Teeth and Oropharynx as per pre-operative assessment

## 2014-09-21 NOTE — Progress Notes (Signed)
Critical lab value- K 2.6. Pt being supplemented. Will continue to monitor.

## 2014-09-21 NOTE — Significant Event (Addendum)
Paged re new onset atrial fibrillation to 160bpm at 12:56am.  Patient was asymptomatic but IABP began tracking poorly and had brief hypotension while 1:1.  IABP switched 1:2.   Now MAP 80 on IABP on 1:2 and levo @ 10 (stable). Patient feels well. Of note, on K check it was 2.6 and he had been given 40PO K.   Impression AF w. RVR in setting of cardiogenic shock, hypokalemia, and pressor use For LVAD in AM  Plan Start amio: 150mg  bolus -->drip Will dose an extra 57meq K PO and give 32meq/hr via PICC x 3 hours Add Mg on to previous draw Recheck Lytes at 5am    5:48am Addendum With above therapies, he is in AV with rates around 130 IABP has been switched back to 1:1 UOP stable at ~30cc/hr and MAPs in mid 70s Cox 51.2, slightly less than goal of 55.  Will avoid uptitration of levophed to meet co-ox goal based desire to avoid worsening AF with RVR. He is to go for LVAD today.

## 2014-09-21 NOTE — Brief Op Note (Signed)
09/10/2014 - 09/21/2014  4:25 PM  PATIENT:  Scott [name]  69 y.o. male  PRE-OPERATIVE DIAGNOSIS:  ICM  POST-OPERATIVE DIAGNOSIS:  ICM  PROCEDURE:  Procedure(s) with comments: INSERTION OF IMPLANTABLE LEFT VENTRICULAR ASSIST DEVICE (N/A) - CIRC ARREST  NITRIC OXIDE TRANSESOPHAGEAL ECHOCARDIOGRAM (TEE) (N/A)  SURGEON:  Surgeon(s) and Role:    * Ivin Poot, MD - Primary       Gaye Pollack MD  assisiting surgeon PHYSICIAN ASSISTANT: n/a  ASSISTANTS: wagoner,T RNFA   ANESTHESIA:   general  EBL:  Total I/O In: 1443 [I.V.:2050; Blood:2131; IV Piggyback:270] Out: 730 [Urine:730]  BLOOD ADMINISTERED: 1 u PRBCs  DRAINS:  MT-2 Pleural tubes -2  Pump pocket drain -1  LOCAL MEDICATIONS USED:  NONE  SPECIMEN:  Excision ol LV apex and LV mural thrombus  DISPOSITION OF SPECIMEN:  PATHOLOGY  COUNTS:  YES  TOURNIQUET:  * No tourniquets in log *  DICTATION: .Dragon Dictation  PLAN OF CARE: Admit to inpatient   PATIENT DISPOSITION:  ICU - intubated and critically ill.   Delay start of Pharmacological VTE agent (>24hrs) due to surgical blood loss or risk of bleeding: yes

## 2014-09-21 NOTE — Progress Notes (Signed)
CSW met with wife in surgical waiting room to provide support. Wife stated she has been updated by staff and feeling well informed. CSW discussed recovery and support and will continue to follow as needed. Raquel Sarna, [place]

## 2014-09-21 NOTE — Anesthesia Postprocedure Evaluation (Signed)
  Anesthesia Post-op Note  Patient: Scott [name]  Procedure(s) Performed: Procedure(s) with comments: INSERTION OF IMPLANTABLE LEFT VENTRICULAR ASSIST DEVICE (N/A) - CIRC ARREST  NITRIC OXIDE TRANSESOPHAGEAL ECHOCARDIOGRAM (TEE) (N/A)  Patient Location: SICU  Anesthesia Type:General  Level of Consciousness: sedated and Patient remains intubated per anesthesia plan  Airway and Oxygen Therapy: Patient remains intubated per anesthesia plan and Patient placed on Ventilator (see vital sign flow sheet for setting)  Post-op Pain: none  Post-op Assessment: Post-op Vital signs reviewed, Patient's Cardiovascular Status Stable and Respiratory Function Stable  Post-op Vital Signs: Reviewed and stable  Last Vitals:  Filed Vitals:   09/21/14 0700  BP: 102/46  Pulse:   Temp:   Resp: 30    Complications: No apparent anesthesia complications

## 2014-09-21 NOTE — Transfer of Care (Signed)
Immediate Anesthesia Transfer of Care Note  Patient: Scott [name]  Procedure(s) Performed: Procedure(s) with comments: INSERTION OF IMPLANTABLE LEFT VENTRICULAR ASSIST DEVICE (N/A) - CIRC ARREST  NITRIC OXIDE TRANSESOPHAGEAL ECHOCARDIOGRAM (TEE) (N/A)  Patient Location: SICU  Anesthesia Type:General  Level of Consciousness: sedated and Patient remains intubated per anesthesia plan  Airway & Oxygen Therapy: Patient remains intubated per anesthesia plan and Patient placed on Ventilator (see vital sign flow sheet for setting)  Post-op Assessment: Report given to RN and Post -op Vital signs reviewed and stable  Post vital signs: Reviewed and stable  Last Vitals:  Filed Vitals:   09/21/14 0700  BP: 102/46  Pulse:   Temp:   Resp: 30    Complications: No apparent anesthesia complications

## 2014-09-21 NOTE — Progress Notes (Signed)
  Echocardiogram Echocardiogram Transesophageal has been performed.  Scott [name] FRANCES 09/21/2014, 2:53 PM

## 2014-09-22 ENCOUNTER — Inpatient Hospital Stay (HOSPITAL_COMMUNITY): Payer: Medicare Other

## 2014-09-22 ENCOUNTER — Encounter (HOSPITAL_COMMUNITY): Payer: Self-pay | Admitting: Cardiothoracic Surgery

## 2014-09-22 DIAGNOSIS — I509 Heart failure, unspecified: Secondary | ICD-10-CM

## 2014-09-22 DIAGNOSIS — I5023 Acute on chronic systolic (congestive) heart failure: Secondary | ICD-10-CM

## 2014-09-22 DIAGNOSIS — Z95811 Presence of heart assist device: Secondary | ICD-10-CM

## 2014-09-22 LAB — CARBOXYHEMOGLOBIN
Carboxyhemoglobin: 1.2 % (ref 0.5–1.5)
Methemoglobin: 1.1 % (ref 0.0–1.5)
O2 Saturation: 65.4 %
Total hemoglobin: 8 g/dL — ABNORMAL LOW (ref 13.5–18.0)

## 2014-09-22 LAB — CBC WITH DIFFERENTIAL/PLATELET
Basophils Absolute: 0 10*3/uL (ref 0.0–0.1)
Basophils Relative: 0 % (ref 0–1)
Eosinophils Absolute: 0.1 10*3/uL (ref 0.0–0.7)
Eosinophils Relative: 1 % (ref 0–5)
HCT: 24.2 % — ABNORMAL LOW (ref 39.0–52.0)
Hemoglobin: 7.8 g/dL — ABNORMAL LOW (ref 13.0–17.0)
Lymphocytes Relative: 9 % — ABNORMAL LOW (ref 12–46)
Lymphs Abs: 1.2 10*3/uL (ref 0.7–4.0)
MCH: 28 pg (ref 26.0–34.0)
MCHC: 32.2 g/dL (ref 30.0–36.0)
MCV: 86.7 fL (ref 78.0–100.0)
Monocytes Absolute: 1.1 10*3/uL — ABNORMAL HIGH (ref 0.1–1.0)
Monocytes Relative: 8 % (ref 3–12)
Neutro Abs: 10.9 10*3/uL — ABNORMAL HIGH (ref 1.7–7.7)
Neutrophils Relative %: 82 % — ABNORMAL HIGH (ref 43–77)
Platelets: 176 10*3/uL (ref 150–400)
RBC: 2.79 MIL/uL — ABNORMAL LOW (ref 4.22–5.81)
RDW: 17.6 % — ABNORMAL HIGH (ref 11.5–15.5)
WBC: 13.3 10*3/uL — ABNORMAL HIGH (ref 4.0–10.5)

## 2014-09-22 LAB — POCT I-STAT 7, (LYTES, BLD GAS, ICA,H+H)
ACID-BASE EXCESS: 12 mmol/L — AB (ref 0.0–2.0)
Bicarbonate: 36 mEq/L — ABNORMAL HIGH (ref 20.0–24.0)
Calcium, Ion: 1.05 mmol/L — ABNORMAL LOW (ref 1.13–1.30)
HEMATOCRIT: 23 % — AB (ref 39.0–52.0)
HEMOGLOBIN: 7.8 g/dL — AB (ref 13.0–17.0)
O2 SAT: 100 %
PCO2 ART: 47.8 mmHg — AB (ref 35.0–45.0)
Patient temperature: 37.8
Potassium: 4.1 mmol/L (ref 3.5–5.1)
SODIUM: 135 mmol/L (ref 135–145)
TCO2: 37 mmol/L (ref 0–100)
pH, Arterial: 7.488 — ABNORMAL HIGH (ref 7.350–7.450)
pO2, Arterial: 385 mmHg — ABNORMAL HIGH (ref 80.0–100.0)

## 2014-09-22 LAB — GLUCOSE, CAPILLARY
GLUCOSE-CAPILLARY: 106 mg/dL — AB (ref 70–99)
GLUCOSE-CAPILLARY: 114 mg/dL — AB (ref 70–99)
GLUCOSE-CAPILLARY: 117 mg/dL — AB (ref 70–99)
GLUCOSE-CAPILLARY: 117 mg/dL — AB (ref 70–99)
GLUCOSE-CAPILLARY: 118 mg/dL — AB (ref 70–99)
GLUCOSE-CAPILLARY: 121 mg/dL — AB (ref 70–99)
GLUCOSE-CAPILLARY: 136 mg/dL — AB (ref 70–99)
GLUCOSE-CAPILLARY: 137 mg/dL — AB (ref 70–99)
GLUCOSE-CAPILLARY: 141 mg/dL — AB (ref 70–99)
GLUCOSE-CAPILLARY: 142 mg/dL — AB (ref 70–99)
GLUCOSE-CAPILLARY: 78 mg/dL (ref 70–99)
GLUCOSE-CAPILLARY: 91 mg/dL (ref 70–99)
Glucose-Capillary: 101 mg/dL — ABNORMAL HIGH (ref 70–99)
Glucose-Capillary: 101 mg/dL — ABNORMAL HIGH (ref 70–99)
Glucose-Capillary: 102 mg/dL — ABNORMAL HIGH (ref 70–99)
Glucose-Capillary: 104 mg/dL — ABNORMAL HIGH (ref 70–99)
Glucose-Capillary: 111 mg/dL — ABNORMAL HIGH (ref 70–99)
Glucose-Capillary: 111 mg/dL — ABNORMAL HIGH (ref 70–99)
Glucose-Capillary: 115 mg/dL — ABNORMAL HIGH (ref 70–99)
Glucose-Capillary: 117 mg/dL — ABNORMAL HIGH (ref 70–99)
Glucose-Capillary: 126 mg/dL — ABNORMAL HIGH (ref 70–99)
Glucose-Capillary: 128 mg/dL — ABNORMAL HIGH (ref 70–99)
Glucose-Capillary: 129 mg/dL — ABNORMAL HIGH (ref 70–99)
Glucose-Capillary: 140 mg/dL — ABNORMAL HIGH (ref 70–99)
Glucose-Capillary: 141 mg/dL — ABNORMAL HIGH (ref 70–99)
Glucose-Capillary: 158 mg/dL — ABNORMAL HIGH (ref 70–99)
Glucose-Capillary: 93 mg/dL (ref 70–99)
Glucose-Capillary: 99 mg/dL (ref 70–99)

## 2014-09-22 LAB — CREATININE, SERUM
Creatinine, Ser: 1.04 mg/dL (ref 0.50–1.35)
GFR calc Af Amer: 84 mL/min — ABNORMAL LOW (ref >90)
GFR calc non Af Amer: 73 mL/min — ABNORMAL LOW (ref >90)

## 2014-09-22 LAB — POCT I-STAT 3, ART BLOOD GAS (G3+)
ACID-BASE EXCESS: 7 mmol/L — AB (ref 0.0–2.0)
Acid-Base Excess: 8 mmol/L — ABNORMAL HIGH (ref 0.0–2.0)
Acid-Base Excess: 7 mmol/L — ABNORMAL HIGH (ref 0.0–2.0)
Acid-Base Excess: 5 mmol/L — ABNORMAL HIGH (ref 0.0–2.0)
Acid-Base Excess: 6 mmol/L — ABNORMAL HIGH (ref 0.0–2.0)
BICARBONATE: 34.5 meq/L — AB (ref 20.0–24.0)
BICARBONATE: 30.1 meq/L — AB (ref 20.0–24.0)
Bicarbonate: 33.6 mEq/L — ABNORMAL HIGH (ref 20.0–24.0)
Bicarbonate: 33.1 mEq/L — ABNORMAL HIGH (ref 20.0–24.0)
Bicarbonate: 33.2 mEq/L — ABNORMAL HIGH (ref 20.0–24.0)
Bicarbonate: 35.1 mEq/L — ABNORMAL HIGH (ref 20.0–24.0)
O2 SAT: 100 %
O2 SAT: 98 %
O2 SAT: 99 %
O2 Saturation: 92 %
O2 Saturation: 99 %
O2 Saturation: 98 %
PCO2 ART: 50.6 mmHg — AB (ref 35.0–45.0)
PCO2 ART: 72.9 mmHg — AB (ref 35.0–45.0)
PH ART: 7.432 (ref 7.350–7.450)
PH ART: 7.364 (ref 7.350–7.450)
PH ART: 7.273 — AB (ref 7.350–7.450)
PO2 ART: 217 mmHg — AB (ref 80.0–100.0)
PO2 ART: 172 mmHg — AB (ref 80.0–100.0)
Patient temperature: 37.2
Patient temperature: 37.3
Patient temperature: 37.5
Patient temperature: 36.4
Patient temperature: 36.9
TCO2: 35 mmol/L (ref 0–100)
TCO2: 35 mmol/L (ref 0–100)
TCO2: 35 mmol/L (ref 0–100)
TCO2: 37 mmol/L (ref 0–100)
TCO2: 32 mmol/L (ref 0–100)
TCO2: 37 mmol/L (ref 0–100)
pCO2 arterial: 58.2 mmHg (ref 35.0–45.0)
pCO2 arterial: 72.3 mmHg (ref 35.0–45.0)
pCO2 arterial: 73.8 mmHg (ref 35.0–45.0)
pCO2 arterial: 66.4 mmHg (ref 35.0–45.0)
pH, Arterial: 7.28 — ABNORMAL LOW (ref 7.350–7.450)
pH, Arterial: 7.218 — ABNORMAL LOW (ref 7.350–7.450)
pH, Arterial: 7.328 — ABNORMAL LOW (ref 7.350–7.450)
pO2, Arterial: 70 mmHg — ABNORMAL LOW (ref 80.0–100.0)
pO2, Arterial: 127 mmHg — ABNORMAL HIGH (ref 80.0–100.0)
pO2, Arterial: 135 mmHg — ABNORMAL HIGH (ref 80.0–100.0)
pO2, Arterial: 145 mmHg — ABNORMAL HIGH (ref 80.0–100.0)

## 2014-09-22 LAB — MAGNESIUM
Magnesium: 2.5 mg/dL (ref 1.5–2.5)
Magnesium: 2.2 mg/dL (ref 1.5–2.5)

## 2014-09-22 LAB — PREPARE FRESH FROZEN PLASMA
UNIT DIVISION: 0
Unit division: 0
Unit division: 0
Unit division: 0
Unit division: 0

## 2014-09-22 LAB — PREPARE PLATELET PHERESIS
Unit division: 0
Unit division: 0

## 2014-09-22 LAB — POCT I-STAT EG7
Acid-Base Excess: 3 mmol/L — ABNORMAL HIGH (ref 0.0–2.0)
BICARBONATE: 28.2 meq/L — AB (ref 20.0–24.0)
Calcium, Ion: 1.11 mmol/L — ABNORMAL LOW (ref 1.13–1.30)
HCT: 25 % — ABNORMAL LOW (ref 39.0–52.0)
HEMOGLOBIN: 8.5 g/dL — AB (ref 13.0–17.0)
O2 SAT: 71 %
PCO2 VEN: 48.3 mmHg (ref 45.0–50.0)
PH VEN: 7.379 — AB (ref 7.250–7.300)
Patient temperature: 38
Potassium: 4 mmol/L (ref 3.5–5.1)
Sodium: 134 mmol/L — ABNORMAL LOW (ref 135–145)
TCO2: 30 mmol/L (ref 0–100)
pO2, Ven: 40 mmHg (ref 30.0–45.0)

## 2014-09-22 LAB — PROTIME-INR
INR: 1.42 (ref 0.00–1.49)
Prothrombin Time: 17.5 seconds — ABNORMAL HIGH (ref 11.6–15.2)

## 2014-09-22 LAB — COMPREHENSIVE METABOLIC PANEL
ALT: 94 U/L — ABNORMAL HIGH (ref 0–53)
AST: 149 U/L — ABNORMAL HIGH (ref 0–37)
Albumin: 3.2 g/dL — ABNORMAL LOW (ref 3.5–5.2)
Alkaline Phosphatase: 107 U/L (ref 39–117)
Anion gap: 8 (ref 5–15)
BUN: 13 mg/dL (ref 6–23)
CO2: 31 mmol/L (ref 19–32)
Calcium: 8.8 mg/dL (ref 8.4–10.5)
Chloride: 98 mmol/L (ref 96–112)
Creatinine, Ser: 0.91 mg/dL (ref 0.50–1.35)
GFR calc Af Amer: >90 mL/min (ref >90)
GFR calc non Af Amer: 86 mL/min — ABNORMAL LOW (ref >90)
Glucose, Bld: 115 mg/dL — ABNORMAL HIGH (ref 70–99)
Potassium: 4 mmol/L (ref 3.5–5.1)
Sodium: 137 mmol/L (ref 135–145)
Total Bilirubin: 3 mg/dL — ABNORMAL HIGH (ref 0.3–1.2)
Total Protein: 5.9 g/dL — ABNORMAL LOW (ref 6.0–8.3)

## 2014-09-22 LAB — POCT I-STAT, CHEM 8
BUN: 16 mg/dL (ref 6–23)
BUN: 17 mg/dL (ref 6–23)
CALCIUM ION: 1.26 mmol/L (ref 1.13–1.30)
CREATININE: 1.1 mg/dL (ref 0.50–1.35)
Calcium, Ion: 1.26 mmol/L (ref 1.13–1.30)
Chloride: 96 mmol/L (ref 96–112)
Chloride: 99 mmol/L (ref 96–112)
Creatinine, Ser: 1.1 mg/dL (ref 0.50–1.35)
Glucose, Bld: 146 mg/dL — ABNORMAL HIGH (ref 70–99)
Glucose, Bld: 111 mg/dL — ABNORMAL HIGH (ref 70–99)
HCT: 30 % — ABNORMAL LOW (ref 39.0–52.0)
HEMATOCRIT: 31 % — AB (ref 39.0–52.0)
Hemoglobin: 10.5 g/dL — ABNORMAL LOW (ref 13.0–17.0)
Hemoglobin: 10.2 g/dL — ABNORMAL LOW (ref 13.0–17.0)
POTASSIUM: 4.4 mmol/L (ref 3.5–5.1)
Potassium: 4.2 mmol/L (ref 3.5–5.1)
Sodium: 137 mmol/L (ref 135–145)
Sodium: 135 mmol/L (ref 135–145)
TCO2: 27 mmol/L (ref 0–100)
TCO2: 26 mmol/L (ref 0–100)

## 2014-09-22 LAB — LACTATE DEHYDROGENASE: LDH: 364 U/L — ABNORMAL HIGH (ref 94–250)

## 2014-09-22 LAB — CBC
HCT: 29.1 % — ABNORMAL LOW (ref 39.0–52.0)
Hemoglobin: 9 g/dL — ABNORMAL LOW (ref 13.0–17.0)
MCH: 27.7 pg (ref 26.0–34.0)
MCHC: 30.9 g/dL (ref 30.0–36.0)
MCV: 89.5 fL (ref 78.0–100.0)
Platelets: 176 10*3/uL (ref 150–400)
RBC: 3.25 MIL/uL — ABNORMAL LOW (ref 4.22–5.81)
RDW: 17.6 % — ABNORMAL HIGH (ref 11.5–15.5)
WBC: 14.9 10*3/uL — ABNORMAL HIGH (ref 4.0–10.5)

## 2014-09-22 LAB — POCT I-STAT GLUCOSE
GLUCOSE: 147 mg/dL — AB (ref 70–99)
OPERATOR ID: 305741

## 2014-09-22 LAB — BRAIN NATRIURETIC PEPTIDE: B Natriuretic Peptide: 1237 pg/mL — ABNORMAL HIGH (ref 0.0–100.0)

## 2014-09-22 LAB — PHOSPHORUS: Phosphorus: 3.6 mg/dL (ref 2.3–4.6)

## 2014-09-22 MED ORDER — ZOLPIDEM TARTRATE 5 MG PO TABS
5.0000 mg | ORAL_TABLET | Freq: Every evening | ORAL | Status: DC | PRN
Start: 2014-09-22 — End: 2014-09-22

## 2014-09-22 MED ORDER — SODIUM CHLORIDE 0.9 % IJ SOLN
10.0000 mL | INTRAMUSCULAR | Status: DC | PRN
  Administered 2014-09-30 – 2014-10-25 (×7): 10 mL
  Administered 2014-10-26: 20 mL
  Administered 2014-10-29: 10 mL
  Filled 2014-09-22 (×9): qty 40

## 2014-09-22 MED ORDER — AMIODARONE IV BOLUS ONLY 150 MG/100ML
150.0000 mg | Freq: Once | INTRAVENOUS | Status: AC
  Administered 2014-09-22: 150 mg via INTRAVENOUS

## 2014-09-22 MED ORDER — FUROSEMIDE 10 MG/ML IJ SOLN: 40.0000 mg | Freq: Once | INTRAMUSCULAR | Status: DC

## 2014-09-22 MED ORDER — SODIUM CHLORIDE 0.9 % IV SOLN
Freq: Once | INTRAVENOUS | Status: AC
  Administered 2014-09-22: 10 mL via INTRAVENOUS

## 2014-09-22 MED ORDER — WARFARIN - PHYSICIAN DOSING INPATIENT: Freq: Every day | Status: DC

## 2014-09-22 MED ORDER — METOPROLOL TARTRATE 1 MG/ML IV SOLN
INTRAVENOUS | Status: AC
Start: 2014-09-22 — End: 2014-09-23
  Filled 2014-09-22: qty 5

## 2014-09-22 MED ORDER — DIPHENHYDRAMINE HCL 50 MG/ML IJ SOLN: 12.5000 mg | Freq: Every evening | INTRAMUSCULAR | Status: DC | PRN

## 2014-09-22 MED ORDER — SODIUM CHLORIDE 0.9 % IJ SOLN
10.0000 mL | Freq: Two times a day (BID) | INTRAMUSCULAR | Status: DC
  Administered 2014-09-24: 20 mL
  Administered 2014-09-25 – 2014-10-06 (×19): 10 mL

## 2014-09-22 MED ORDER — VANCOMYCIN HCL IN DEXTROSE 1-5 GM/200ML-% IV SOLN
1000.0000 mg | Freq: Two times a day (BID) | INTRAVENOUS | Status: AC
  Administered 2014-09-22 – 2014-09-25 (×6): 1000 mg via INTRAVENOUS
  Filled 2014-09-22 (×7): qty 200

## 2014-09-22 MED ORDER — DIGOXIN 0.0625 MG HALF TABLET: 0.0625 mg | ORAL_TABLET | Freq: Every day | ORAL | Status: DC

## 2014-09-22 MED ORDER — SODIUM CHLORIDE 0.9 % IV SOLN
600.0000 mg | Freq: Once | INTRAVENOUS | Status: AC
  Administered 2014-09-22: 600 mg via INTRAVENOUS
  Filled 2014-09-22: qty 600

## 2014-09-22 MED ORDER — DIPHENHYDRAMINE HCL 50 MG/ML IJ SOLN
12.5000 mg | Freq: Every evening | INTRAMUSCULAR | Status: DC | PRN
Start: 2014-09-22 — End: 2014-09-23
  Administered 2014-09-22: 12.5 mg via INTRAVENOUS
  Filled 2014-09-22: qty 1

## 2014-09-22 MED ORDER — DEXMEDETOMIDINE HCL IN NACL 400 MCG/100ML IV SOLN
0.4000 ug/kg/h | INTRAVENOUS | Status: DC
  Administered 2014-09-22: 0.7 ug/kg/h via INTRAVENOUS
  Filled 2014-09-22: qty 100

## 2014-09-22 MED ORDER — POTASSIUM CHLORIDE 10 MEQ/50ML IV SOLN
10.0000 meq | INTRAVENOUS | Status: AC
  Administered 2014-09-22 (×2): 10 meq via INTRAVENOUS

## 2014-09-22 MED ORDER — LEVALBUTEROL HCL 1.25 MG/0.5ML IN NEBU
1.2500 mg | INHALATION_SOLUTION | Freq: Four times a day (QID) | RESPIRATORY_TRACT | Status: DC
  Administered 2014-09-22 – 2014-09-28 (×21): 1.25 mg via RESPIRATORY_TRACT
  Filled 2014-09-22 (×27): qty 0.5

## 2014-09-22 MED ORDER — WARFARIN SODIUM 5 MG PO TABS
5.0000 mg | ORAL_TABLET | Freq: Once | ORAL | Status: AC
  Administered 2014-09-22: 5 mg via ORAL
  Filled 2014-09-22: qty 1

## 2014-09-22 MED ORDER — METOPROLOL TARTRATE 1 MG/ML IV SOLN
2.5000 mg | Freq: Once | INTRAVENOUS | Status: AC
  Administered 2014-09-22: 2.5 mg via INTRAVENOUS

## 2014-09-22 MED FILL — Heparin Sodium (Porcine) Inj 1000 Unit/ML: INTRAMUSCULAR | Qty: 30 | Status: AC

## 2014-09-22 MED FILL — Magnesium Sulfate Inj 50%: INTRAMUSCULAR | Qty: 10 | Status: AC

## 2014-09-22 MED FILL — Vasopressin Inj 20 Unit/ML: INTRAMUSCULAR | Qty: 2.5 | Status: AC

## 2014-09-22 MED FILL — Sodium Chloride IV Soln 0.9%: INTRAVENOUS | Qty: 250 | Status: AC

## 2014-09-22 MED FILL — Potassium Chloride Inj 2 mEq/ML: INTRAVENOUS | Qty: 40 | Status: AC

## 2014-09-22 NOTE — Progress Notes (Signed)
Pt extubated to N/C at 6l N/C with nitric at 3ppm. Lasix admin.  PAS of 39-40 and PADs in upper teens.  Pleural CT removed.  Pt tolerated well.  Levophed off.  Dr. Haroldine Laws rounded on pt.

## 2014-09-22 NOTE — Progress Notes (Signed)
UR Completed.  [phone]  

## 2014-09-22 NOTE — Progress Notes (Signed)
[facility] CLINICAL SOCIAL WORK DOCUMENTATION LVAD (Left Ventricular Assist Device) Psychosocial Screening Please remember that all information is confidential within the members of the VAD team and [facility]  09/16/2014 11:25 AM  Patient:  Scott [name]  MRN:  [id number]  Account:  [id number]  Clinical Social Worker:  Ulla Gallo, LCSW Date/Time Initiated:   09/16/2014 11:25 AM Referral Source:    Zada Girt, VAD Coordinator  Referral Reason:   LVAD Placement  Source of Information:   Patient and wife   PATIENT DEMOGRAPHICS NAME:   Scott [name]     DOB:  May 04, 1946  SS#:  [id number] Address:   [address]  [place]  [place] [address] Home Phone:  [phone]  Cell Phone:    Marital Status:   married     Primary Language:  ENGLISH  FaithSherryll Burger Adherence with Medical Regimen:   compliant  Medication Adherence:   compliant  Physician appointment attendance:   compliant   Do you have a Living Will or Medical POA?  Y Would you like to complete a Living Will and Medical POA prior to surgery?  N Are you currently a DNR?  N Do you have a MOST form?  N Would you like to review one?  Y Do you have goals of care?  Y   Have you had a consult with the palliative care team at [facility]?  N Comment:   Psychological Health Appearance:   Patient was dressed neatly in hospital gown sitting up in chair.  Mental status:   alert and oriented  Eye Contact:   Good  Thought Content:   Coherent and relevant  Speech:   normal volume and tone  Mood:   Patient was interactive with conversation and appropriate  Affect:   Patient was responsive and appropriate for mood  Insight:   Patient appeared to have good insight to his condition  Judgment:   sound  Interaction Style:   Patient appeared to have good insight to his condition   Family/Social Information Who lives in your home? Name9  Fredricka Bonine    Relationship to you9   wife    Do you have a plan for child care if relevant?   n/a  List family members outside the home (parents, friends, pastor, etc..)     Please list people who give you emotional support (family, parents, friends, pastor, etc..) Name3  Pastor Rockoff    Relationship to [employer] pastor    Who is your primary and backup support pre and post-surgery? Explain the relationships i.e. strengths/weakness, etc.:   Delontae Lamm (patient's wife) will be primary caregiver. Patient and wife appear to have good relationship and wife is also retired and able to be primary caregiver 24/7. "We are already together 24/7"  Secondary Caregiver identified:   Wife reports supportive friends from [employer] in [place], [place] and will ask for assistance with back up caregiver role.   Legal Do you currently have any legal issues/problems?   no  Durable POA or Legal Next of Kin:   [name]- wife   Living situation Travel distance to [facility]:  20 minutes  Second Hand Smoke Exposure:   no  Self- Care level:   Independent  Ambulation:   Independent  Transportation:   drives self  Limitations:   Patient denies any limitations other than "can't run due to stent placement in 2013"  Barriers impacting ability to participate in care:  Community Are you active with community agencies/resources?   no  Are you active in a church, synagogue, mosque, or other faith based community?   [place], [place]  What other sources do you have for spiritual support?   "guy friends"  Are you active in any clubs or social organizations?   no  What do you do for fun? hobbies, interests?  sex, working on cars, college basketball, watching the History channel and car/boat racing   Education/Work information What is the last grade of school you completed?  1 year college  Preferred method of learning? (Written, verbal, hands on):   hands on  Do you have any problems with  reading or writing?  no  Are you currently employed? If no, when were you last employed?   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   32 years  If you are not currently working, do you plan to return to work after VAD surgery?   no  If yes, what type of employment do you hope to find?  Are you interested in job training or learning new skills?   no  Did you serve in the [employer]? If so, what branch?   2 years in [place] - [employer]   Financial Information What is your source of income?    SSA & pension  Do you have difficulty meeting your monthly expenses?   no  If yes, which ones?   How do you usually cope with this?    n/a  Primary Health Insurance:    Medicare  Secondary Insurance:   Have you applied for Medicaid?    no  Have you applied for Social Security Disability?    n/a  Do you have prescription coverage?    yes  What are your prescription co-pays?    $40 or less  Are you required to use a certain pharmacy?    [facility]  Do you have a mail order option for your prescriptions?    yes  If yes, what pharmacy do you use for mail order?   Have you ever refused medication due to cost?    no  Discuss monthly cost for dressing supplies post procedure $150-300    discussed and reviewed  Can you budget for this monthly expense?    yes   Medical Information Briefly describe your medical history, surgeries and why you are here for evaluation.    Patient reports his heart disease began in 2013 with a MI and stent placement. In 2014, he had a cath and then another MI/angina/blood clots in 2015. In 2016 he was admitted for CHF and had 13lbs of fluid removed.  Are you able to complete your ADL's?    Independent  Do you have any history of emotional, medical, physical or verbal trauma?    Patient reports he cut off his big toe during a  lawnmower accident.  Do you have any family history of heart problems?    He reports his Father and 4 uncles all had heart disease. His sister and cousins also have  heart related diagnoses.  Do you smoke? If so, what is the amount and frequency?    no- past usage during [employer] but not since then  Do you drink alcohol? If so, how many drinks a day/week?  beer occasionally  Have you ever used illegal drugs or misused medications?    "back in the day" nothing any recent time  If yes, what drugs do you use and how often?   Have you ever been treated for substance abuse?    no  If yes, where and when were you treated?   Are you currently using illegal substances?    no   Mental Health History Have you ever had problems with depression, anxiety or other mental health issues?   Patient states no but states "I am grouchy"  If yes, have you seen a counselor, psychiatrist or therapist?    no  If you are currently experiencing problems are you interested in talking with a professional?    no  Would you be interested in participating in an LVAD support group?  Y LVAD support group for: Patient Caregivers patient Have you or are you taking any medications for anxiety/depression or any mental health concerns?    no  If yes, Please list the medications?    Paient states that he has a prescription for trasodone to sleep  How have you been feeling in the past year?    "pretty good until Novemeber 2015"  How do you handle stressful situations?   Wife reports "gets quiet". Patient stated "work through" "it's what your dealt"  What are your coping strategies? Please List:    Patient reports he goes out to his workshop as an outlet during stressful times.  Have you had any past or current thoughts of suicide?    no  How many hours do you sleep at night?    varies 4hrs?  How is your appetite?   pretty good  PHQ2- Depression Screen:    1  PHQ9 Depression screen (only complete if the PHQ2 is  positive):    Plan for VAD Implementation Do you know and understand what happens during VAD surgery?  Please describe your thoughts:   Patient's understanding of surgery was realistic and accurate.  What do you know about the risks of any major surgery or use of general anesthesia?    Patient appeared to understand the risks of death, stroke and infection with the procedure.  What do you know about the risks and side effects associated with VAD surgery?    Patient verbalizes understanding of the risks.  Explain what will happen right after surgery?    Patient verbalizes understanding of intubation and ICU stay and then transfer to regular floor until discharge home.  Information obtained from:    Patient and wife  What is your plan for transportation for the first 8 weeks post-surgery? (Patients are not recommended to drive post-surgery for 8 weeks)    [name]- wife will provide all transport needs.  Driver: [employer]  Valid license:    yes  Working Conservator, museum/gallery:    Hortencia Pilar and [employer] [id number]  Airbags:  yes Do you plan to disarm the airbags- there is a risk of discharging the device if the airbag were to deploy.   Patient will consider  What do you know about your diet post-surgery?    heart healthy  How do you plan to monitor your medications, current and future?    wife reports she handles all medications  How do you plan to complete ADL's post-surgery Sales executive, dress, etc.)?    Patient reports with assistance of wife until independent  Will it be difficult to ask for help for your caregivers? If so, explain:  no  Please explain what you hope will be improved about your life as a result of receiving the VAD    "normalcy"  "able to walk longer distances"  Please tell me your biggest concern or fear about receiving the VAD?    "stroke or becoming a vegetable as result of surgery"  How do you cope with your concerns/fears?    strong faith  Please explain your  understanding of how your body will change? Are you worried about these changes?    Patient denies any issues  Do you see any barriers to your surgery or follow up? If yes, please explain:   Patient denies any barriers     Understanding of LVAD Surgical procedures and risks:    discussed and reviewed  Electrical need for LVAD:    Patient stated they have 3 prong outlets and aware that power company will be informed of his VAD status and need for electricity.  Safety precautions with LVAD (Water, etc.):   Patient verbalizes understanding of safety precautions and no swimming or baths.  Potential side effects with LVAD:    Discussed and reviewed  Types of Advanced heart failure therapies available:    Discussed and reviewed  LVAD daily self-care (dressing changes, computer check, extra supplies):    Discussed and reviewed  Outpatient follow-up (follow-up in LVAD clinic; monitoring blood thinners)    Discussed and reviewed  Need for emergency planning:    iscussed and reviewed  Expectations for LVAD:    Patient reports he hopes to return to a normal life.  Current level of motivation to prepare for LVAD:    Patient appears motivated for LVAD.  Patient's perception of need for LVAD:    Patient stated he felt the need for LVAD was imminent  Present level of consent for LVAD:    "Ready"  Reasons for seeking LVAD:    "To get back to a normal life"   Psychosocial Protective Factors  Patient has a very involved and supportive wife. He reports strong faith with a supportive Church community. He has both a Living Will and HPOA. he is independent and active man prior to decline and recent hospitalization. He denies any financial concerns and has health insurance. He denies any significant history of trauma. No history of alcohol, tobacco or substance abuse. No depression noted and scored a 1 on the PHQ-2. Psychosocial Risk Factors  Patient only has a primary caregiver although reports that some  church members have offered to assist when needed.  Clinical Intervention: CSW will provide support to caregiver and offer support group. CSW will follow for continued monitoring throughout recovery.   Educated patient/family on the following Caregiver(s) role responsibilities:   discussed and reviewed  Financial planning for LVAD:    discussed and reviewed  Role of Clinical Social Worker:    discussed and reviewed  Signs of depression and anxiety:    discussed and reviewed. No sign present  Support planning for LVAD:    Discussed and reviewed  LVAD process:    Discussed and reviewed  Caregiver contract/agreement:   Contract agreement reviewed with patient and caregiver by LVAD Coordinator  Discussed Referral(s) to:    CSW discussed support group post hospitalization  Community Resources:    Patient and wife have strong support from [employer].  Clinical Impression Recommendations:    Mr. Mineo is a good psychosocial candidate for the LVAD implantation. He verbalized understanding of his complex medical needs and compliance with follow up  post surgery/hospitalization. He has a very supportive wife who is available 24/7 and confident in her caregiving abilities.  He has no risk factors other than lack of secondary caregiver which he feels is not an issue as his Church members are very supportive. He appears to have good coping skills and is motivated to get back his life after VAD surgery. CSW will continue to monitor and provide support throughout his recovery. Raquel Sarna, [place]

## 2014-09-22 NOTE — Progress Notes (Signed)
Called ABG to Dr. Darcey Nora / ph of 7.21 with C02 of 74.  Rate increased to 20 w/ follow-up ABG at 2100.  HR remains in the mid-130s since coughing episode.  150mg  bolus of amiodarone and 2.5mg  IV lopressor ordered.  CXR is pending / PICC is now in place.

## 2014-09-22 NOTE — Progress Notes (Signed)
LVAD interrogation reveals:  Speed:  8800 Flow:  5.1 Power:  5.0 PI:  5.7 Alarms:  none Events: none Fixed speed:  8800  Low speed limit: 8200   LVAD Exit Site:  VAD dressing removed and site care performed using sterile technique. Drive line exit site cleaned with Chlora prep applicators x 2, allowed to dry, and gauze dressing with aquacel strip applied. Exit site with two sutures in place, the velour is fully implanted at exit site. Small amount erythema and serous drainage with no foul odor, no tenderness at site.  Drive line anchor in place. Driveline dressing is being changed daily per sterile technique.

## 2014-09-22 NOTE — Progress Notes (Signed)
CT surgery p.m. Rounds  Patient sitting up in bed extubated on BiPAP for CO2 retention secondary to probable restrictive lung disease from his ankylosing spondylitis. No evidence of diaphragmatic dysfunction on exam  Hemodynamics remained stable, norepinephrine weaned off. Excellent VAD parameters being met. Minimal chest tube drainage with adequate urine output. Hemoglobin 9.0 this p.m.

## 2014-09-22 NOTE — Progress Notes (Signed)
Admitted 09/10/14 with worsening heart failure and cardiogenic shock.  HeartMate II LVAD implated on 09/21/14 by Dr. Darcey Nora as destination therapy VAD.   Vital signs: Temp:  98.6 - 100.2 HR:  82 - 88 Arterial line:  68 - 88 O2 Sat:  96 - 100 Wt in lbs:  173 > 194   LVAD interrogation reveals:  Speed:  8800 Flow: 5.0 Power:  5.0 PI:  5.4 Alarms: none Events:  none Fixed speed:  8800 Low speed limit: 8200  Blood products: OR:  1 unit PC, 2 plts, 3 FFP ICU: 09/22/14 - 1 unit PC  IABP: Out of OR on 1:3 ratio DC'd overnight  Gtts: Dopamine 3  Milrinone .25 Epinephrine 4 Levophed 10 Amiodarone 30  Nitric Oxide:  3.5 PPM  Drive Line: gauze dressing with bloody drainage; attachment device in place    Labs:  LDH trend: 364  INR trend: 1.42  Co-Ox:  65  WBC:  13.3  Hgb:  7.8 (received one unit PC)  Plan/Recommendations as discussed with VAD team:  1.  Weaning vent today; nurse aware pt has fear of intubation/weaning process. 2.  Will change drive line dressing this afternoon and add aquacel silver strip at exit site.

## 2014-09-22 NOTE — Progress Notes (Signed)
Nitric weaned to off

## 2014-09-22 NOTE — Progress Notes (Signed)
CSW met with patient and wife at bedside. Patient extubated and sitting up in bed. CSW provided support and wife states she is doing well and slept well last night. Both patient and wife feel supported by staff and deny any concerns at this time. CSW will continue to provide support throughout recovery. Raquel Sarna, [place]

## 2014-09-22 NOTE — Progress Notes (Signed)
Mr. Anastasi is awake and alert and was recently extubated. He says pain is well controlled. He has no complaints except that he feels stuck to the bed and says he is looking forward to getting out of bed. I encouraged him and said that this is a good sign. I will continue to follow and support.   Vinie Sill, NP Palliative Medicine Team Pager # [phone] (M-F 8a-5p) Team Phone # [phone] (Nights/Weekends)

## 2014-09-22 NOTE — Procedures (Signed)
Extubation Procedure Note  Patient Details:   Name: Scott [name] DOB: April 03, 1946 MRN: [id number]   Airway Documentation:     Evaluation  O2 sats: stable throughout Complications: No apparent complications Patient did tolerate procedure well. Bilateral Breath Sounds: Clear, Diminished Suctioning: Oral, Airway Yes  NIF-26 FVC 674ml ABG called to Dr. Darcey Nora Extubated to 6l/min Vici with 3ppm Nitric  Revonda Standard 09/22/2014, 12:50 PM

## 2014-09-22 NOTE — Progress Notes (Signed)
Dr. Darcey Nora notified of ABG - elevated C02 of 57 with PA sys in the upper 40s and PADs in the upper 20s / order provided to extubate pt if NIF and VC of 500 is met.  Nitric through N/C at 3ppm.  38m lasix IV.  Will continue to monitor.

## 2014-09-22 NOTE — Progress Notes (Signed)
HeartMate 2 Rounding Note  Subjective:    Post Day 1- HMII LVAD   Weaning from vent. Follows commands. Remains on dopamine 3 mcg, epi 4 mcg, milrinone 0. 25 mcg, amio 30 mg, and norepi  10 mcg. This am he received 1UPRBCs.    Swan Numbers  CVP 13 PA pressures in 30s   LDH 364  CO-OX 65%  Hgb 7.8 --had 1UPRBCs.   LVAD INTERROGATION:  HeartMate II LVAD:  Flow 4.9 liters/min, speed 8800, power 5.0, PI 5.3.  No PI events over night.     Objective:    Vital Signs:   Temp:  [98.6 F (37 C)-100.2 F (37.9 C)] 99.3 F (37.4 C) (03/23 0700) Pulse Rate:  [33-120] 88 (03/23 0700) Resp:  [6-26] 12 (03/23 0700) BP: (76-81)/(66-67) 76/66 mmHg (03/23 0325) SpO2:  [96 %-100 %] 100 % (03/23 0700) FiO2 (%):  [50 %] 50 % (03/23 0700) Weight:  [194 lb 7.1 oz (88.2 kg)] 194 lb 7.1 oz (88.2 kg) (03/23 0500) Last BM Date: 09/20/14 Mean arterial Pressure  68  Intake/Output:   Intake/Output Summary (Last 24 hours) at 09/22/14 0725 Last data filed at 09/22/14 0700  Gross per 24 hour  Intake 9360.27 ml  Output   4825 ml  Net 4535.27 ml     Physical Exam: General:  Intubated, awake HEENT: normal Neck: supple. RIJ swan  Carotids 2+ bilat; no bruits. No lymphadenopathy or thryomegaly appreciated. RIJ Swan  Cor: Mechanical heart sounds with LVAD hum present. Sternal dressing 4 mediastinal tubes.  Lungs: clear Abdomen: soft, nontender, nondistended. No hepatosplenomegaly. No bruits or masses. Good bowel sounds. Driveline: Dressing with minimal bloody exudate noted.  Extremities: no cyanosis, clubbing, rash, 1+ edema R and LLE SCDs  RUE A-line  Neuro: Awake follows commands GU: Foley   Telemetry: NSR 80s   Labs: Basic Metabolic Panel:  Recent Labs Lab 09/20/14 0420 09/21/14 0015 09/21/14 0019 09/21/14 0450 09/21/14 0740  09/21/14 1138 09/21/14 1230 09/21/14 1352 09/21/14 1355 09/21/14 1454 09/21/14 1500 09/21/14 1558 09/22/14 0300  NA 134* 138  --  126* 131*  < > 132*  132* 133* 134* 135  --  136 137  K 4.0 2.6*  --  4.6 5.6*  < > 4.3 4.2 4.1 4.0 4.1  --  4.1 4.0  CL 91* 98  --  78* 85*  < > 88* 89* 91*  --   --   --  94* 98  CO2 36* 35*  --  37* 38*  --   --   --   --   --   --   --   --  31  GLUCOSE 127* 219*  --  482* 312*  < > 129* 196* 212*  --   --  147* 108* 115*  BUN 11 14  --  18 19  < > 21 23 23   --   --   --  22 13  CREATININE 0.88 0.71  --  1.08 1.15  < > 0.90 1.00 1.00  --   --   --  1.00 0.91  CALCIUM 8.6 6.5*  --  8.1* 8.3*  --   --   --   --   --   --   --   --  8.8  MG 2.0  --  1.5  --   --   --   --   --   --   --   --   --   --  2.5  PHOS  --   --   --   --   --   --   --   --   --   --   --   --   --  3.6  < > = values in this interval not displayed.  Liver Function Tests:  Recent Labs Lab 09/16/14 1700 09/22/14 0300  AST 46* 149*  ALT 62* 94*  ALKPHOS 226* 107  BILITOT 1.2 3.0*  PROT 7.7 5.9*  ALBUMIN 3.0* 3.2*   No results for input(s): LIPASE, AMYLASE in the last 168 hours. No results for input(s): AMMONIA in the last 168 hours.  CBC:  Recent Labs Lab 09/18/14 2230 09/19/14 0400 09/20/14 0420 09/21/14 0450  09/21/14 1225  09/21/14 1352 09/21/14 1355 09/21/14 1454 09/21/14 1558 09/22/14 0300  WBC 13.3* 12.7* 12.6* 13.3*  --   --   --   --   --   --   --  13.3*  NEUTROABS  --   --   --   --   --   --   --   --   --   --   --  10.9*  HGB 9.7* 9.6* 9.8* 10.7*  < > 7.5*  < > 9.5* 8.5* 7.8* 9.9* 7.8*  HCT 32.1* 31.6* 32.6* 35.0*  < > 24.0*  < > 28.0* 25.0* 23.0* 29.0* 24.2*  MCV 92.0 92.4 92.6 92.3  --   --   --   --   --   --   --  86.7  PLT 406* 413* 394 374  --  206  --   --   --   --   --  176  < > = values in this interval not displayed.  INR:  Recent Labs Lab 09/20/14 0421 09/20/14 1635 09/21/14 0450 09/21/14 1647 09/22/14 0300  INR 1.28 1.25 1.43 1.42 1.42    Other results:  EKG:   Imaging: Dg Chest Port 1 View  09/21/2014   CLINICAL DATA:  Left ventricular assist device placement.  EXAM:  PORTABLE CHEST - 1 VIEW  COMPARISON:  09/21/2014 at 4:35 a.m.  FINDINGS: Endotracheal tube tip 3.6 cm above the carina. Mediastinal drain in place. Right internal jugular Swan-Ganz catheter just into the right pulmonary artery. Bilateral chest tubes are in place. Prior right PICC line is no longer seen. Nasogastric tube terminates in the stomach.  Intra-aortic balloon pump projects at an just below the level of the aortic arch. Left ventricular assist device inflow valve observed, with much of the device below the area of imaging.  Continued airspace opacity at both lung bases, stable. The interstitial edema shown on the prior exam is minimally improved. Stable enlargement of the cardiopericardial silhouette.  No significant pneumothorax. A skin fold projects over the right chest laterally.  IMPRESSION: 1. Interval intubation, bilateral chest tube placement, mediastinal drain placement, Swan-Ganz catheter placement, and LVAD placement. No malpositioning identified. No significant pneumothorax. Intra aortic balloon pump projects at and just below the level of the aortic arch. 2. Stable bibasilar airspace opacities. The underlying interstitial edema is improved.   Electronically Signed   By: Van Clines M.D.   On: 09/21/2014 17:27   Dg Chest Port 1 View  09/21/2014   CLINICAL DATA:  Congestive heart failure  EXAM: PORTABLE CHEST - 1 VIEW  COMPARISON:  09/19/2014  FINDINGS: The right IJ Swan-Ganz catheter has been removed. There is a right upper extremity PICC line extending into the  cavoatrial junction. There is improvement, with partial clearance of central and basilar opacities. Pleural fluid remains visible within the fissure. Cardiomegaly is unchanged.  IMPRESSION: SG catheter removal. Partial clearance of central and basilar airspace opacities. Persistent right pleural effusion.   Electronically Signed   By: Andreas Newport M.D.   On: 09/21/2014 05:03      Medications:     Scheduled  Medications: . sodium chloride   Intravenous Once  . acetaminophen  1,000 mg Oral 4 times per day   Or  . acetaminophen (TYLENOL) oral liquid 160 mg/5 mL  1,000 mg Per Tube 4 times per day  . antiseptic oral rinse  7 mL Mouth Rinse QID  . aspirin EC  325 mg Oral Daily   Or  . aspirin  324 mg Per Tube Daily   Or  . aspirin  300 mg Rectal Daily  . bisacodyl  10 mg Oral Daily   Or  . bisacodyl  10 mg Rectal Daily  . cefUROXime (ZINACEF)  IV  1.5 g Intravenous Q12H  . chlorhexidine  15 mL Mouth Rinse BID  . digoxin  0.125 mg Oral Daily  . docusate sodium  200 mg Oral Daily  . fluconazole (DIFLUCAN) IV  400 mg Intravenous Once  . insulin regular  0-10 Units Intravenous TID WC  . metoCLOPramide (REGLAN) injection  10 mg Intravenous 4 times per day  . multivitamin with minerals  1 tablet Oral Daily  . [START ON 09/23/2014] pantoprazole  40 mg Oral Daily  . rifampin  600 mg Oral Once  . rosuvastatin  10 mg Oral q1800  . sodium chloride  10-40 mL Intracatheter Q12H  . sodium chloride  3 mL Intravenous Q12H  . vancomycin  1,000 mg Intravenous Q12H     Infusions: . sodium chloride 20 mL/hr at 09/22/14 0300  . sodium chloride    . sodium chloride 20 mL (09/22/14 0412)  . amiodarone 30 mg/hr (09/22/14 0700)  . dexmedetomidine 0.7 mcg/kg/hr (09/22/14 0700)  . DOPamine 3 mcg/kg/min (09/22/14 0700)  . epinephrine 4 mcg/min (09/22/14 0700)  . insulin (NOVOLIN-R) infusion 0.8 Units/hr (09/22/14 0700)  . lactated ringers    . lactated ringers    . milrinone 0.25 mcg/kg/min (09/22/14 0700)  . nitroGLYCERIN    . norepinephrine (LEVOPHED) Adult infusion 10 mcg/min (09/22/14 0700)     PRN Medications:  sodium chloride, fentaNYL, midazolam, morphine injection, ondansetron (ZOFRAN) IV, oxyCODONE, sodium chloride, sodium chloride, traMADol   Assessment:  1. S/P HMII LVAD 09/21/14  2.  A/C systolic HF class IV with cardiogenic shock- EF 15% with moderate RV dysfunction 3/16 echo.  3.  CAD  s/p Anterior STEMI on 06/12/14 with stenting of LAD- on coumadin + plavix, statin.  4.  06/12/14 VT in setting of STEMI 5.  Ankylosing spondylitis. 6. Bilateral PE with R DVT- S/P IVC filter 08/27/2014--->coumadin 7.  Asystole noted from Life Vest 8.  LV thrombus 9.  Full Code 10. Hemoptysis, resolved 11. A/C renal failure, resolved 12. Pleural effusion, transudative  13. ?PNA 14. Acute Blood Loss- Anemia- Received 1UPRBCs 3/23  Plan/Discussion:   POD #1  HMII LVAD.    Stable overnight. Received 1UPRBCs. CO-OX ok.   Remains on dopamine 3 mcg, epi 4 mcg, milrinone 0. 25 mcg, amio 30 mg, and norepi  10 mcg. MAPs ok watch closely.    Maintaining NSR. Mag 2.5   I reviewed the LVAD parameters from today, and compared the results to the patient's prior recorded  data.  No programming changes were made.  The LVAD is functioning within specified parameters.  The patient performs LVAD self-test daily.  LVAD interrogation was negative for any significant power changes, alarms or PI events/speed drops.  LVAD equipment check completed and is in good working order.  Back-up equipment present.   LVAD education done on emergency procedures and precautions and reviewed exit site care.  Length of Stay: 12  CLEGG,AMY 09/22/2014, 7:25 AM  VAD Team --- VAD ISSUES ONLY--- Pager [phone] (7am - 7am)  Advanced Heart Failure Team  Pager [phone] (M-F; 7a - 4p)  Please contact [facility] for night-coverage after hours (4p -7a ) and weekends on [email or web address]  Patient seen and examined with Darrick Grinder, NP. We discussed all aspects of the encounter. I agree with the assessment and plan as stated above.   Doing well POD #1 VAD placement. Hemodynamics measured personally and VAD interrogated. Doing well. Hopefull extubate later today. Wean pressors. Diurese as tolerated. Will resume Plavix in 48 hours if ok with TCTS. May be able to increase VAD speed soon.   The patient is critically ill with multiple  organ systems failure and requires high complexity decision making for assessment and support, frequent evaluation and titration of therapies, application of advanced monitoring technologies and extensive interpretation of multiple databases.   Critical Care Time personally devoted to patient care services described in this note is 35 Minutes.  Benay Spice 4:36 PM

## 2014-09-22 NOTE — Op Note (Signed)
NAME:  Scott [name], BELTRE NO.:  [id number]  MEDICAL RECORD NO.:  [id number]  LOCATION:  2S10C                        FACILITY:  [facility]  PHYSICIAN:  Ivin Poot, M.D.  DATE OF BIRTH:  07-02-1946  DATE OF PROCEDURE: DATE OF DISCHARGE:                              OPERATIVE REPORT   OPERATIONS: 1. Implantation of HeartMate II left ventricular assist device. 2. Endarterectomy of left ventricular mural thrombus.  PREOPERATIVE DIAGNOSES:  Acute on chronic systolic heart failure, cardiogenic shock, preop balloon pump.  POSTOPERATIVE DIAGNOSIS:  Acute on chronic systolic heart failure, cardiogenic shock, preop balloon pump.  SURGEON:  Ivin Poot, M.D.  ASSISTING SURGEON:  Gilford Raid, M.D.  ASSISTANT:  Dineen Kid, RNFA.  ANESTHESIA:  General by Albertha Ghee, MD.  INDICATIONS:  The patient is a 69 year old Caucasian male who was admitted to the hospital with acute on chronic systolic heart failure, in cardiogenic shock.  He also had sustained a recent right pulmonary embolism with pulmonary infarction of the right middle lobe.  He was resuscitated and placed on inotropes and underwent right heart cath and echocardiogram.  This demonstrated EF of 10% to 15%, pulmonary hypertension, with elevated wedge pressure and low cardiac index.  He needed dual inotropes for hemodynamic stability.  He continued to deteriorate and required balloon pump placement.  He underwent urgent evaluation for implantable left ventricular assist device for destination therapy indication.  I discussed the procedure of destination therapy, VAD implantation with the patient and his family. I discussed the indications, benefits, alternatives of the procedure as well as the risks including risks of stroke, bleeding, blood transfusion requirement, postoperative pulmonary problems including pleural effusion or pneumonia, postoperative multiorgan system failure, infection, and death.   After reviewing these issues, he demonstrated his understanding and agreed to proceed with surgery under what I felt was an informed consent.  OPERATIVE FINDINGS: 1. Post MI pericarditis with fibrinous exudate over the heart. 2. Severe LV dilatation, RV function fairly well preserved. 3. Minimal-to-trace aortic insufficiency. 4. Successful implantation of HeartMate II left ventricular assist     device. 5. Extraction of large amounts of mural thrombus from the LV apex     during implantation of LVAD.  DESCRIPTION OF PROCEDURE:  The patient was brought from the CCU directly to the OR on balloon pump support.  General anesthesia was induced and the patient was intubated.  Pulmonary artery catheter and radial A-line were placed.  The patient was prepped and draped as a sterile field.  A proper time-out was performed.  A sternal incision was made.  The pericardium was opened and there were large amounts of bloody pericardial effusion.  The epicardial surface of the heart was covered with fibrinous exudate from post MI pericarditis.  First, the sternal elevating retractor was placed with some difficulty because of his ankylosing spondylitis.  A pocket was created for the LVAD pump by taking down the left hemidiaphragm and creating a space beneath the rectus sheath on the left side.  The pocket was extended in the midline into the right side as well under the sternum.  The sternal retractor was placed.  The pericardium was suspended and a counter incision was made to the  right of the umbilicus for the power cable and the exit site was placed beneath the right costal margin.  The tunnelers were then positioned in the these incisions for pulling the power cord to the upper abdomen.  Heparin was administered.  Pursestrings were placed in the ascending aorta and right atrium.  The patient was cannulated after the ACT was documented as being therapeutic.  CO2 was insufflated into the  operative field.  A partial occluding clamp was placed on the ascending aorta.  The patient tolerated this.  An aortotomy was performed for anastomosis of the outflow graft end-to-side to the ascending aorta.  This was constructed with a running 4-0 Prolene.  A light layer of medical adhesive/Coseal was placed around the suture line.  A vascular clamp was placed on the graft as the partial occluding clamp was removed from the aorta.  The graft was then placed to the side.  The patient was then placed on cardiopulmonary bypass.  The heart remained empty beating.  The left ventricle was inspected.  There were large amounts of necrotic material from the old infarct in December 2015, around the apex.  The apical dimple was identified and the heart was positioned under some lap pads.  An 11 blade was made just above and medial to the apical dimple.  A tonsil was used to dilate this incision and through this, a 14-French Foley catheter with a 5 mL balloon was inserted in the LV.  Over the Foley catheter, a coring device was used to excise the left ventricular apex for cannulation of the inflow VAD cannula.  Upon opening the LV apex, there was a large amount of mural thrombus and this was carefully debrided from the endocardium circumferentially.  The ventricle was irrigated with copious amounts of warm saline to remove any particulate matter.  The muscle also was necrotic.  He had a transmural extension, so care was being taken not to debride any of the myocardial wall.  #1 Ethibond pledgeted sutures were placed around the opening in the LV apex.  A total number of sutures 17 were placed.  Silastic-Dacron sleeve was sewn to the LV apex using the pledgeted sutures which were passed through the sewing ring of the Silastic sleeve.  The inner cannula was removed and all the sutures were tied. There was no space between the sutures for later bleeding and a fine layer of medical adhesive/Coseal  was placed around the suture line.  The heart was filled with volume and the HeartMate II pump was brought to the field.  The apical cannula was then inserted in the LV cavity and secured with both the incorporated Ethibond suture, a cable tie, and then #2 Ethibond sutures ties above and below the cable tie.  The heart was decompressed with the outflow connection which was connected to the LV vent.  The driveline was pulled from the intrapericardial space through the counter incision in the abdomen and then out the exit site in the right upper quadrant and connected to the controller.  The heart was filled with volume again and the outflow graft connector was connected to the pump and screwed again tight.  The pump was placed in the pocket with 1 coil of the driveline beneath the pump.  The lungs were inflated after the left pleural cavity was suctioned of fluid and blood.  We also opened the right pleural space at this time to remove the right pleural effusion from the previous pulmonary infarct and to place  a chest tube dependently in the right pleural space.  The fluid was sent for culture.  The lungs were re-expanded and ventilator was resumed on nitric oxide inhaled through the ventilator circuit.  Low-medium dose inotropes were started including epinephrine, Levophed, and milrinone.  Temporary pacing wires were placed on the RV and right atrium.  The heart pump was then started while the outflow graft was clamped at 6000 rpm's. Cardiopulmonary bypass flow rates were reduced and the heart was filled and the clamp was removed from the outflow graft.  A 20-gauge needle was placed for removal of any air.  The cardiopulmonary bypass circuit was then reduced to 1 L and the speed on the VAD pump was increased to 7000, then 8000 rpm's.  We came completely off cardiopulmonary bypass at a VAD speed of 8600.  Echo showed the septum slightly bowed to the right.  RV function appeared to be  good.  CVP and PA pressures were fairly low with a mean pressure of 65 to 70.  The balloon pump was resumed at a 1:3 50% augmentation setting.  The venous cannula was removed.  There was some air which was removed from the circuit with the needle and the outflow graft.  After air was removed, volume was given to the patient and hemodynamics and echo looked very favorable.  The needle was removed.  Protamine was administered without adverse reaction.  The arterial cannula was removed and the pursestrings were tied.  The patient remained stable.  Then, relief guard was then placed in the outflow pump connection and secured with heavy silk sutures.  The pump was placed deep into the pocket and the outflow elbow was secured to the posterior rectus sheath with a #2 Vicryl suture.  Protamine was administered.  There was still some coagulopathy and the patient was given FFP and platelets with improved coagulation function. Chest tubes were placed in both pleural spaces, in the anterior mediastinum, and then the posterior mediastinum, and in the pump pocket. These were all brought out through separate incisions and secured to the skin.  The patient remained hemodynamically stable.  The sternum was closed with interrupted steel wire.  The pectoralis fascia was closed with interrupted #1 Vicryl.  The subcutaneous and skin layers were closed in running Vicryl.  The abdominal counter incision was closed with interrupted Vicryl and subcuticular skin suture and the exit site of the power cord was closed with subcutaneous interrupted Vicryl and 2 interrupted nylon skin sutures.  Sterile dressings were placed.  The chest tubes were all connected to under water-seal Pleur-Evac drainage systems.  The patient remained stable.  The patient was then transported back to the intensive care unit.  Total cardiopulmonary bypass time was 105 minutes.     Ivin Poot, M.D.     PV/MEDQ  D:   09/21/2014  T:  09/22/2014  Job:  [id number]

## 2014-09-22 NOTE — Progress Notes (Signed)
HeartMate 2 Rounding Note  Subjective:   POD #1 VAD implant  (DT)  For acute /chronic systolic HF from ischemic cardiomyopathy and cariogenic shock on preop IABP  Recent PE- with large R pulmonary infarct,necrotic lung/pneumonia DM  HX ventricular, atrial arrhythmia    LVAD INTERROGATION:  HeartMate II LVAD:  Flow 5.1 liters/min, speed 8800 rpm power 4.1, PI 4.5.  Controller intact  Objective:    Vital Signs:   Temp:  [98.6 F (37 C)-100.2 F (37.9 C)] 99.3 F (37.4 C) (03/23 0700) Pulse Rate:  [33-120] 88 (03/23 0700) Resp:  [6-26] 12 (03/23 0700) BP: (76-81)/(66-67) 76/66 mmHg (03/23 0325) SpO2:  [96 %-100 %] 100 % (03/23 0700) FiO2 (%):  [50 %] 50 % (03/23 0752) Weight:  [194 lb 7.1 oz (88.2 kg)] 194 lb 7.1 oz (88.2 kg) (03/23 0500) Last BM Date: 09/20/14 Mean arterial Pressure 70  Intake/Output:   Intake/Output Summary (Last 24 hours) at 09/22/14 0804 Last data filed at 09/22/14 0700  Gross per 24 hour  Intake 9360.27 ml  Output   4825 ml  Net 4535.27 ml     Physical Exam: General:  Sedated on vent HEENT: normal Neck: supple. JVP . Carotids 2+ bilat; no bruits. No lymphadenopathy or thryomegaly appreciated. Cor: Mechanical heart sounds with LVAD hum present. Lungs: coars bs on R Abdomen: soft, nontender, nondistended. No hepatosplenomegaly. No bruits or masses. Good bowel sounds. Driveline: C/D/I; securement device intact and driveline incorporated Extremities: no cyanosis, clubbing, rash, edema- IABP out Neuro: alert on vent   Telemetry:nsr  Labs: Basic Metabolic Panel:  Recent Labs Lab 09/20/14 0420 09/21/14 0015 09/21/14 0019 09/21/14 0450 09/21/14 0740  09/21/14 1138 09/21/14 1230 09/21/14 1352 09/21/14 1355 09/21/14 1454 09/21/14 1500 09/21/14 1558 09/22/14 0300  NA 134* 138  --  126* 131*  < > 132* 132* 133* 134* 135  --  136 137  K 4.0 2.6*  --  4.6 5.6*  < > 4.3 4.2 4.1 4.0 4.1  --  4.1 4.0  CL 91* 98  --  78* 85*  < > 88* 89* 91*   --   --   --  94* 98  CO2 36* 35*  --  37* 38*  --   --   --   --   --   --   --   --  31  GLUCOSE 127* 219*  --  482* 312*  < > 129* 196* 212*  --   --  147* 108* 115*  BUN 11 14  --  18 19  < > 21 23 23   --   --   --  22 13  CREATININE 0.88 0.71  --  1.08 1.15  < > 0.90 1.00 1.00  --   --   --  1.00 0.91  CALCIUM 8.6 6.5*  --  8.1* 8.3*  --   --   --   --   --   --   --   --  8.8  MG 2.0  --  1.5  --   --   --   --   --   --   --   --   --   --  2.5  PHOS  --   --   --   --   --   --   --   --   --   --   --   --   --  3.6  < > =  values in this interval not displayed.  Liver Function Tests:  Recent Labs Lab 09/16/14 1700 09/22/14 0300  AST 46* 149*  ALT 62* 94*  ALKPHOS 226* 107  BILITOT 1.2 3.0*  PROT 7.7 5.9*  ALBUMIN 3.0* 3.2*   No results for input(s): LIPASE, AMYLASE in the last 168 hours. No results for input(s): AMMONIA in the last 168 hours.  CBC:  Recent Labs Lab 09/18/14 2230 09/19/14 0400 09/20/14 0420 09/21/14 0450  09/21/14 1225  09/21/14 1352 09/21/14 1355 09/21/14 1454 09/21/14 1558 09/22/14 0300  WBC 13.3* 12.7* 12.6* 13.3*  --   --   --   --   --   --   --  13.3*  NEUTROABS  --   --   --   --   --   --   --   --   --   --   --  10.9*  HGB 9.7* 9.6* 9.8* 10.7*  < > 7.5*  < > 9.5* 8.5* 7.8* 9.9* 7.8*  HCT 32.1* 31.6* 32.6* 35.0*  < > 24.0*  < > 28.0* 25.0* 23.0* 29.0* 24.2*  MCV 92.0 92.4 92.6 92.3  --   --   --   --   --   --   --  86.7  PLT 406* 413* 394 374  --  206  --   --   --   --   --  176  < > = values in this interval not displayed.  INR:  Recent Labs Lab 09/20/14 0421 09/20/14 1635 09/21/14 0450 09/21/14 1647 09/22/14 0300  INR 1.28 1.25 1.43 1.42 1.42    Other results:  EKG:   Imaging: Dg Chest Port 1 View  09/21/2014   CLINICAL DATA:  Left ventricular assist device placement.  EXAM: PORTABLE CHEST - 1 VIEW  COMPARISON:  09/21/2014 at 4:35 a.m.  FINDINGS: Endotracheal tube tip 3.6 cm above the carina. Mediastinal drain in  place. Right internal jugular Swan-Ganz catheter just into the right pulmonary artery. Bilateral chest tubes are in place. Prior right PICC line is no longer seen. Nasogastric tube terminates in the stomach.  Intra-aortic balloon pump projects at an just below the level of the aortic arch. Left ventricular assist device inflow valve observed, with much of the device below the area of imaging.  Continued airspace opacity at both lung bases, stable. The interstitial edema shown on the prior exam is minimally improved. Stable enlargement of the cardiopericardial silhouette.  No significant pneumothorax. A skin fold projects over the right chest laterally.  IMPRESSION: 1. Interval intubation, bilateral chest tube placement, mediastinal drain placement, Swan-Ganz catheter placement, and LVAD placement. No malpositioning identified. No significant pneumothorax. Intra aortic balloon pump projects at and just below the level of the aortic arch. 2. Stable bibasilar airspace opacities. The underlying interstitial edema is improved.   Electronically Signed   By: Van Clines M.D.   On: 09/21/2014 17:27   Dg Chest Port 1 View  09/21/2014   CLINICAL DATA:  Congestive heart failure  EXAM: PORTABLE CHEST - 1 VIEW  COMPARISON:  09/19/2014  FINDINGS: The right IJ Swan-Ganz catheter has been removed. There is a right upper extremity PICC line extending into the cavoatrial junction. There is improvement, with partial clearance of central and basilar opacities. Pleural fluid remains visible within the fissure. Cardiomegaly is unchanged.  IMPRESSION: SG catheter removal. Partial clearance of central and basilar airspace opacities. Persistent right pleural effusion.   Electronically Signed   By:  Andreas Newport M.D.   On: 09/21/2014 05:03      Medications:     Scheduled Medications: . acetaminophen  1,000 mg Oral 4 times per day   Or  . acetaminophen (TYLENOL) oral liquid 160 mg/5 mL  1,000 mg Per Tube 4 times per  day  . antiseptic oral rinse  7 mL Mouth Rinse QID  . aspirin EC  325 mg Oral Daily   Or  . aspirin  324 mg Per Tube Daily   Or  . aspirin  300 mg Rectal Daily  . bisacodyl  10 mg Oral Daily   Or  . bisacodyl  10 mg Rectal Daily  . cefUROXime (ZINACEF)  IV  1.5 g Intravenous Q12H  . chlorhexidine  15 mL Mouth Rinse BID  . digoxin  0.125 mg Oral Daily  . docusate sodium  200 mg Oral Daily  . fluconazole (DIFLUCAN) IV  400 mg Intravenous Once  . insulin regular  0-10 Units Intravenous TID WC  . metoCLOPramide (REGLAN) injection  10 mg Intravenous 4 times per day  . multivitamin with minerals  1 tablet Oral Daily  . [START ON 09/23/2014] pantoprazole  40 mg Oral Daily  . rifampin  600 mg Oral Once  . rosuvastatin  10 mg Oral q1800  . sodium chloride  10-40 mL Intracatheter Q12H  . sodium chloride  3 mL Intravenous Q12H  . vancomycin  1,000 mg Intravenous Q12H  . warfarin  5 mg Oral ONCE-1800     Infusions: . sodium chloride 20 mL/hr at 09/22/14 0300  . sodium chloride    . sodium chloride 20 mL (09/22/14 0412)  . amiodarone 30 mg/hr (09/22/14 0700)  . dexmedetomidine 0.7 mcg/kg/hr (09/22/14 0700)  . DOPamine 3 mcg/kg/min (09/22/14 0700)  . epinephrine 4 mcg/min (09/22/14 0700)  . insulin (NOVOLIN-R) infusion 0.8 Units/hr (09/22/14 0700)  . lactated ringers    . lactated ringers    . milrinone 0.25 mcg/kg/min (09/22/14 0700)  . nitroGLYCERIN    . norepinephrine (LEVOPHED) Adult infusion 10 mcg/min (09/22/14 0700)     PRN Medications:  sodium chloride, fentaNYL, midazolam, morphine injection, ondansetron (ZOFRAN) IV, oxyCODONE, sodium chloride, sodium chloride, traMADol   Assessment:  Stable hemodynamics- no PI events Nitric oxide weaned off- hope to extubate Start po coumadin this pm- min chest tube output Cont pressors for RV, wean slowly Filling pressures low- hold lasix for now   Plan/Discussion:     I reviewed the LVAD parameters from today, and compared  the results to the patient's prior recorded data.  No programming changes were made.  The LVAD is functioning within specified parameters.  The patient performs LVAD self-test daily.  LVAD interrogation was negative for any significant power changes, alarms or PI events/speed drops.  LVAD equipment check completed and is in good working order.  Back-up equipment present.   LVAD education done on emergency procedures and precautions and reviewed exit site care.  Length of Stay: [place] [facility] 09/22/2014, 8:04 AM  VAD Team --- VAD ISSUES ONLY--- Pager [phone] (7am - 7am)  Advanced Heart Failure Team  Pager [phone] (M-F; 7a - 4p)  Please contact [facility] for night-coverage after hours (4p -7a ) and weekends on [email or web address]

## 2014-09-22 NOTE — Progress Notes (Signed)
PT Cancellation Note  Patient Details Name: Scott [name] MRN: [id number] DOB: December 06, 1945   Cancelled Treatment:    Reason Eval/Treat Not Completed: Other (comment) (Order to begin POD #2.  Will evaluate as able tomorrow.  )Thanks.   Irwin Brakeman F 09/22/2014, 10:25 AM [facility] [phone] (pager)

## 2014-09-22 NOTE — Progress Notes (Signed)
Followup ABG performed at 1400 / Dr. Darcey Nora notified of ph 7.28 and C02 of 70.  Vision Bipap ordered at a rate of 14 with a pressure of 12/6.  ABG to be performed in one hour.  Pt to be on bipap a minimum of 4 hours.

## 2014-09-23 ENCOUNTER — Telehealth: Payer: Self-pay | Admitting: *Deleted

## 2014-09-23 ENCOUNTER — Inpatient Hospital Stay (HOSPITAL_COMMUNITY): Payer: Medicare Other

## 2014-09-23 DIAGNOSIS — I480 Paroxysmal atrial fibrillation: Secondary | ICD-10-CM

## 2014-09-23 LAB — POCT I-STAT, CHEM 8
BUN: 28 mg/dL — ABNORMAL HIGH (ref 6–23)
BUN: 26 mg/dL — ABNORMAL HIGH (ref 6–23)
CALCIUM ION: 1.18 mmol/L (ref 1.13–1.30)
CALCIUM ION: 1.21 mmol/L (ref 1.13–1.30)
Chloride: 96 mmol/L (ref 96–112)
Chloride: 96 mmol/L (ref 96–112)
Creatinine, Ser: 1.2 mg/dL (ref 0.50–1.35)
Creatinine, Ser: 1.3 mg/dL (ref 0.50–1.35)
Glucose, Bld: 147 mg/dL — ABNORMAL HIGH (ref 70–99)
Glucose, Bld: 112 mg/dL — ABNORMAL HIGH (ref 70–99)
HCT: 31 % — ABNORMAL LOW (ref 39.0–52.0)
HEMATOCRIT: 29 % — AB (ref 39.0–52.0)
HEMOGLOBIN: 10.5 g/dL — AB (ref 13.0–17.0)
Hemoglobin: 9.9 g/dL — ABNORMAL LOW (ref 13.0–17.0)
Potassium: 4.9 mmol/L (ref 3.5–5.1)
Potassium: 3.8 mmol/L (ref 3.5–5.1)
Sodium: 135 mmol/L (ref 135–145)
Sodium: 134 mmol/L — ABNORMAL LOW (ref 135–145)
TCO2: 27 mmol/L (ref 0–100)
TCO2: 28 mmol/L (ref 0–100)

## 2014-09-23 LAB — COMPREHENSIVE METABOLIC PANEL
ALT: 168 U/L — ABNORMAL HIGH (ref 0–53)
AST: 246 U/L — ABNORMAL HIGH (ref 0–37)
Albumin: 3 g/dL — ABNORMAL LOW (ref 3.5–5.2)
Alkaline Phosphatase: 124 U/L — ABNORMAL HIGH (ref 39–117)
Anion gap: 9 (ref 5–15)
BUN: 18 mg/dL (ref 6–23)
CO2: 29 mmol/L (ref 19–32)
Calcium: 8.4 mg/dL (ref 8.4–10.5)
Chloride: 98 mmol/L (ref 96–112)
Creatinine, Ser: 1.03 mg/dL (ref 0.50–1.35)
GFR calc Af Amer: 85 mL/min — ABNORMAL LOW (ref >90)
GFR calc non Af Amer: 74 mL/min — ABNORMAL LOW (ref >90)
Glucose, Bld: 136 mg/dL — ABNORMAL HIGH (ref 70–99)
Potassium: 4.3 mmol/L (ref 3.5–5.1)
Sodium: 136 mmol/L (ref 135–145)
Total Bilirubin: 3.4 mg/dL — ABNORMAL HIGH (ref 0.3–1.2)
Total Protein: 6.3 g/dL (ref 6.0–8.3)

## 2014-09-23 LAB — BLOOD GAS, ARTERIAL
Acid-Base Excess: 3.9 mmol/L — ABNORMAL HIGH (ref 0.0–2.0)
Acid-Base Excess: 4 mmol/L — ABNORMAL HIGH (ref 0.0–2.0)
Bicarbonate: 29.8 mEq/L — ABNORMAL HIGH (ref 20.0–24.0)
Bicarbonate: 30.5 mEq/L — ABNORMAL HIGH (ref 20.0–24.0)
Delivery systems: POSITIVE
Delivery systems: POSITIVE
Drawn by: 252031
Drawn by: 313941
Expiratory PAP: 5
FIO2: 0.4 %
FIO2: 0.4 %
Inspiratory PAP: 18
Mode: POSITIVE
O2 Saturation: 99.3 %
O2 Saturation: 99.4 %
PEEP: 5 cmH2O
Patient temperature: 98.6
Patient temperature: 98.6
Pressure control: 13 cmH2O
RATE: 20 resp/min
TCO2: 31.6 mmol/L (ref 0–100)
TCO2: 32.6 mmol/L (ref 0–100)
pCO2 arterial: 61.9 mmHg (ref 35.0–45.0)
pCO2 arterial: 69.9 mmHg (ref 35.0–45.0)
pH, Arterial: 7.303 — ABNORMAL LOW (ref 7.350–7.450)
pH, Arterial: 7.262 — ABNORMAL LOW (ref 7.350–7.450)
pO2, Arterial: 148 mmHg — ABNORMAL HIGH (ref 80.0–100.0)
pO2, Arterial: 146 mmHg — ABNORMAL HIGH (ref 80.0–100.0)

## 2014-09-23 LAB — POCT I-STAT 3, ART BLOOD GAS (G3+)
ACID-BASE EXCESS: 5 mmol/L — AB (ref 0.0–2.0)
ACID-BASE EXCESS: 7 mmol/L — AB (ref 0.0–2.0)
Acid-Base Excess: 3 mmol/L — ABNORMAL HIGH (ref 0.0–2.0)
BICARBONATE: 33.1 meq/L — AB (ref 20.0–24.0)
Bicarbonate: 32.5 mEq/L — ABNORMAL HIGH (ref 20.0–24.0)
Bicarbonate: 34.6 mEq/L — ABNORMAL HIGH (ref 20.0–24.0)
O2 SAT: 98 %
O2 Saturation: 87 %
O2 Saturation: 99 %
PH ART: 7.213 — AB (ref 7.350–7.450)
Patient temperature: 97.7
TCO2: 34 mmol/L (ref 0–100)
TCO2: 36 mmol/L (ref 0–100)
TCO2: 37 mmol/L (ref 0–100)
pCO2 arterial: 63 mmHg (ref 35.0–45.0)
pCO2 arterial: 81.6 mmHg (ref 35.0–45.0)
pCO2 arterial: 68.6 mmHg (ref 35.0–45.0)
pH, Arterial: 7.317 — ABNORMAL LOW (ref 7.350–7.450)
pH, Arterial: 7.311 — ABNORMAL LOW (ref 7.350–7.450)
pO2, Arterial: 115 mmHg — ABNORMAL HIGH (ref 80.0–100.0)
pO2, Arterial: 64 mmHg — ABNORMAL LOW (ref 80.0–100.0)
pO2, Arterial: 151 mmHg — ABNORMAL HIGH (ref 80.0–100.0)

## 2014-09-23 LAB — CBC WITH DIFFERENTIAL/PLATELET
Basophils Absolute: 0 10*3/uL (ref 0.0–0.1)
Basophils Relative: 0 % (ref 0–1)
Eosinophils Absolute: 0 10*3/uL (ref 0.0–0.7)
Eosinophils Relative: 0 % (ref 0–5)
HCT: 28.5 % — ABNORMAL LOW (ref 39.0–52.0)
Hemoglobin: 8.8 g/dL — ABNORMAL LOW (ref 13.0–17.0)
Lymphocytes Relative: 4 % — ABNORMAL LOW (ref 12–46)
Lymphs Abs: 0.7 10*3/uL (ref 0.7–4.0)
MCH: 27.6 pg (ref 26.0–34.0)
MCHC: 30.5 g/dL (ref 30.0–36.0)
MCV: 90.5 fL (ref 78.0–100.0)
Monocytes Absolute: 1.1 10*3/uL — ABNORMAL HIGH (ref 0.1–1.0)
Monocytes Relative: 7 % (ref 3–12)
Neutro Abs: 13.9 10*3/uL — ABNORMAL HIGH (ref 1.7–7.7)
Neutrophils Relative %: 88 % — ABNORMAL HIGH (ref 43–77)
Platelets: 181 10*3/uL (ref 150–400)
RBC: 3.15 MIL/uL — ABNORMAL LOW (ref 4.22–5.81)
RDW: 17.6 % — ABNORMAL HIGH (ref 11.5–15.5)
WBC: 15.8 10*3/uL — ABNORMAL HIGH (ref 4.0–10.5)

## 2014-09-23 LAB — GLUCOSE, CAPILLARY
GLUCOSE-CAPILLARY: 100 mg/dL — AB (ref 70–99)
GLUCOSE-CAPILLARY: 40 mg/dL — AB (ref 70–99)
Glucose-Capillary: 127 mg/dL — ABNORMAL HIGH (ref 70–99)
Glucose-Capillary: 138 mg/dL — ABNORMAL HIGH (ref 70–99)
Glucose-Capillary: 138 mg/dL — ABNORMAL HIGH (ref 70–99)
Glucose-Capillary: 142 mg/dL — ABNORMAL HIGH (ref 70–99)
Glucose-Capillary: 145 mg/dL — ABNORMAL HIGH (ref 70–99)
Glucose-Capillary: 154 mg/dL — ABNORMAL HIGH (ref 70–99)
Glucose-Capillary: 187 mg/dL — ABNORMAL HIGH (ref 70–99)
Glucose-Capillary: 119 mg/dL — ABNORMAL HIGH (ref 70–99)
Glucose-Capillary: 176 mg/dL — ABNORMAL HIGH (ref 70–99)
Glucose-Capillary: 236 mg/dL — ABNORMAL HIGH (ref 70–99)
Glucose-Capillary: 255 mg/dL — ABNORMAL HIGH (ref 70–99)
Glucose-Capillary: 59 mg/dL — ABNORMAL LOW (ref 70–99)

## 2014-09-23 LAB — PHOSPHORUS: Phosphorus: 4.4 mg/dL (ref 2.3–4.6)

## 2014-09-23 LAB — MAGNESIUM: Magnesium: 2.6 mg/dL — ABNORMAL HIGH (ref 1.5–2.5)

## 2014-09-23 LAB — CARBOXYHEMOGLOBIN
Carboxyhemoglobin: 1.5 % (ref 0.5–1.5)
Carboxyhemoglobin: 1.1 % (ref 0.5–1.5)
Methemoglobin: 1 % (ref 0.0–1.5)
Methemoglobin: 0.9 % (ref 0.0–1.5)
O2 Saturation: 73.4 %
O2 Saturation: 80.9 %
Total hemoglobin: 8.6 g/dL — ABNORMAL LOW (ref 13.5–18.0)
Total hemoglobin: 9.2 g/dL — ABNORMAL LOW (ref 13.5–18.0)

## 2014-09-23 LAB — TISSUE CULTURE
Culture: NO GROWTH
Gram Stain: NONE SEEN

## 2014-09-23 LAB — PROTIME-INR
INR: 1.44 (ref 0.00–1.49)
Prothrombin Time: 17.6 seconds — ABNORMAL HIGH (ref 11.6–15.2)

## 2014-09-23 LAB — LACTATE DEHYDROGENASE: LDH: 496 U/L — ABNORMAL HIGH (ref 94–250)

## 2014-09-23 MED ORDER — DEXTROSE 5 % IV SOLN
1.0000 g | Freq: Three times a day (TID) | INTRAVENOUS | Status: DC
  Administered 2014-09-23 – 2014-09-27 (×12): 1 g via INTRAVENOUS
  Filled 2014-09-23 (×15): qty 1

## 2014-09-23 MED ORDER — FUROSEMIDE 10 MG/ML IJ SOLN
40.0000 mg | Freq: Once | INTRAMUSCULAR | Status: AC
  Administered 2014-09-23: 40 mg via INTRAVENOUS

## 2014-09-23 MED ORDER — FUROSEMIDE 10 MG/ML IJ SOLN
4.0000 mg/h | INTRAVENOUS | Status: DC
  Administered 2014-09-23: 6 mg/h via INTRAVENOUS
  Filled 2014-09-23: qty 25

## 2014-09-23 MED ORDER — DEXTROSE 50 % IV SOLN
INTRAVENOUS | Status: AC
  Administered 2014-09-23: 50 mL
  Filled 2014-09-23: qty 50

## 2014-09-23 MED ORDER — WARFARIN - PHYSICIAN DOSING INPATIENT
Freq: Every day | Status: DC
  Administered 2014-09-24 – 2014-09-26 (×3)

## 2014-09-23 MED ORDER — AMIODARONE IV BOLUS ONLY 150 MG/100ML
150.0000 mg | Freq: Once | INTRAVENOUS | Status: AC
  Administered 2014-09-23: 150 mg via INTRAVENOUS

## 2014-09-23 MED ORDER — DILTIAZEM HCL 100 MG IV SOLR
3.0000 mg/h | INTRAVENOUS | Status: DC
  Administered 2014-09-23 – 2014-09-24 (×2): 5 mg/h via INTRAVENOUS
  Filled 2014-09-23: qty 100

## 2014-09-23 MED ORDER — CHLORHEXIDINE GLUCONATE 0.12 % MT SOLN
15.0000 mL | Freq: Two times a day (BID) | OROMUCOSAL | Status: DC
  Administered 2014-09-24 (×2): 15 mL via OROMUCOSAL
  Filled 2014-09-23: qty 15

## 2014-09-23 MED ORDER — LIDOCAINE HCL (PF) 1 % IJ SOLN
INTRAMUSCULAR | Status: AC
  Administered 2014-09-23: 2 mL
  Filled 2014-09-23: qty 5

## 2014-09-23 MED ORDER — INSULIN DETEMIR 100 UNIT/ML ~~LOC~~ SOLN
12.0000 [IU] | Freq: Two times a day (BID) | SUBCUTANEOUS | Status: DC
  Administered 2014-09-23: 12 [IU] via SUBCUTANEOUS
  Filled 2014-09-23 (×2): qty 0.12

## 2014-09-23 MED ORDER — PANTOPRAZOLE SODIUM 40 MG IV SOLR
40.0000 mg | Freq: Every day | INTRAVENOUS | Status: DC
  Administered 2014-09-23 – 2014-09-26 (×4): 40 mg via INTRAVENOUS
  Filled 2014-09-23 (×8): qty 40

## 2014-09-23 MED ORDER — DEXTROSE 50 % IV SOLN
25.0000 mL | Freq: Once | INTRAVENOUS | Status: AC
  Administered 2014-09-23: 25 mL via INTRAVENOUS

## 2014-09-23 MED ORDER — INSULIN ASPART 100 UNIT/ML ~~LOC~~ SOLN
0.0000 [IU] | SUBCUTANEOUS | Status: DC
  Administered 2014-09-23: 2 [IU] via SUBCUTANEOUS
  Administered 2014-09-24: 4 [IU] via SUBCUTANEOUS
  Administered 2014-09-25: 2 [IU] via SUBCUTANEOUS
  Administered 2014-09-25: 4 [IU] via SUBCUTANEOUS
  Administered 2014-09-25 – 2014-09-26 (×2): 2 [IU] via SUBCUTANEOUS
  Administered 2014-09-26: 4 [IU] via SUBCUTANEOUS
  Administered 2014-09-26 – 2014-09-28 (×5): 2 [IU] via SUBCUTANEOUS

## 2014-09-23 MED ORDER — INSULIN DETEMIR 100 UNIT/ML ~~LOC~~ SOLN
6.0000 [IU] | Freq: Two times a day (BID) | SUBCUTANEOUS | Status: DC
  Administered 2014-09-23: 6 [IU] via SUBCUTANEOUS
  Filled 2014-09-23 (×2): qty 0.06

## 2014-09-23 MED ORDER — METOPROLOL TARTRATE 1 MG/ML IV SOLN
2.5000 mg | Freq: Once | INTRAVENOUS | Status: AC
  Administered 2014-09-23: 2.5 mg via INTRAVENOUS

## 2014-09-23 MED ORDER — CETYLPYRIDINIUM CHLORIDE 0.05 % MT LIQD
7.0000 mL | Freq: Two times a day (BID) | OROMUCOSAL | Status: DC
  Administered 2014-09-23 – 2014-09-24 (×3): 7 mL via OROMUCOSAL

## 2014-09-23 MED ORDER — DEXTROSE 50 % IV SOLN
50.0000 mL | Freq: Once | INTRAVENOUS | Status: AC
  Administered 2014-09-23: 50 mL via INTRAVENOUS

## 2014-09-23 MED ORDER — KETOROLAC TROMETHAMINE 15 MG/ML IJ SOLN
15.0000 mg | Freq: Four times a day (QID) | INTRAMUSCULAR | Status: AC | PRN
Start: 2014-09-23 — End: 2014-09-26
  Administered 2014-09-23 – 2014-09-26 (×5): 15 mg via INTRAVENOUS
  Filled 2014-09-23 (×5): qty 1

## 2014-09-23 MED ORDER — MAGNESIUM SULFATE 2 GM/50ML IV SOLN
2.0000 g | Freq: Once | INTRAVENOUS | Status: AC
  Administered 2014-09-23: 2 g via INTRAVENOUS

## 2014-09-23 MED ORDER — WARFARIN SODIUM 5 MG PO TABS
5.0000 mg | ORAL_TABLET | Freq: Every day | ORAL | Status: AC
  Administered 2014-09-23: 5 mg via ORAL
  Filled 2014-09-23: qty 1

## 2014-09-23 MED ORDER — MAGNESIUM SULFATE 2 GM/50ML IV SOLN
INTRAVENOUS | Status: AC
  Filled 2014-09-23: qty 50

## 2014-09-23 MED ORDER — DEXTROSE 50 % IV SOLN
INTRAVENOUS | Status: AC
  Administered 2014-09-23: 25 mL via INTRAVENOUS
  Filled 2014-09-23: qty 50

## 2014-09-23 MED FILL — Mannitol IV Soln 20%: INTRAVENOUS | Qty: 500 | Status: AC

## 2014-09-23 MED FILL — Electrolyte-R (PH 7.4) Solution: INTRAVENOUS | Qty: 4000 | Status: AC

## 2014-09-23 MED FILL — Heparin Sodium (Porcine) Inj 1000 Unit/ML: INTRAMUSCULAR | Qty: 10 | Status: AC

## 2014-09-23 MED FILL — Calcium Chloride Inj 10%: INTRAVENOUS | Qty: 10 | Status: AC

## 2014-09-23 MED FILL — Sodium Bicarbonate IV Soln 8.4%: INTRAVENOUS | Qty: 50 | Status: AC

## 2014-09-23 MED FILL — Sodium Chloride IV Soln 0.9%: INTRAVENOUS | Qty: 3000 | Status: AC

## 2014-09-23 NOTE — Progress Notes (Signed)
CT surgery p.m. Rounds  Patient stood and was out of bed to chair for about an hour He still remains dependent on BiPAP due to restrictive lung disease from ankylosing spondylitis. LVAD function and parameters are satisfactory Mean arterial pressure is elevated-weaning off epinephrine and dopamine-continue milrinone, continue inhaled nitric oxide

## 2014-09-23 NOTE — Progress Notes (Signed)
Hypoglycemic Event  CBG: 40  Treatment: 50 mg D50  Symptoms: Pale and Sweaty  Follow-up CBG: Time:1218 CBG Result:100  Possible Reasons for Event: Inadequate meal intake and Medication regimen: Levemir started  Comments/MD notified:Dr. [name] notified    Scott [name]  Remember to initiate Hypoglycemia Order Set & complete

## 2014-09-23 NOTE — Progress Notes (Signed)
[name] is sitting up in recliner and just finished working with therapy. He says he feels "rough" today - I am sure BiPAP has been difficult for him. Offered encouragement and support. Wife at bedside. Will allow him time to rest. I will continue to follow.   Vinie Sill, NP Palliative Medicine Team Pager # [phone] (M-F 8a-5p) Team Phone # [phone] (Nights/Weekends)

## 2014-09-23 NOTE — Progress Notes (Signed)
CSW met with patient and wife at bedside in the ICU. Patient was sitting up in chair in hospital room. Patient was able to switch from base to battery with no issues as reported by wife. Wife mentioned that she went to [employer] store yesterday inquiring about a cell phone. CSW encouraged her to follow through with obtaining a cell phone as it would benefit patient when not home. Wife agreed. CSW will continue to follow and provide support as needed. Raquel Sarna, [place]

## 2014-09-23 NOTE — Progress Notes (Signed)
Spoke with Dr. [name] regarding increased PA pressure, minimal urine output, increase in ectopy, and overall patient status. Orders received to give Lasix 40mg  IV, increase Milrinone drip to 0.52mcg, increase Nitric to 5ppm, and to obtain i-stat chem 8. Will continue to closely monitor. Richarda Blade RN

## 2014-09-23 NOTE — Progress Notes (Signed)
Current Aline will not pull back blood. MD aware and per RN, ordered to place new aline. RT tried to place new arterial line 2 times by 2 RT's unsuccessfully. RN aware and MD notified.

## 2014-09-23 NOTE — Progress Notes (Signed)
Dr. [name] notified of the following ABG results at 1420: pH 7.26, pCO2 69.9, pO2 146, HCO3 30.5, sO2 99. BiPAP RR increased to 14 at this time. Follow-up ABG drawn at 1645 with the following results: pH 7.311, pCO2 68.6, pO2 151, HCO3 34.6, sO2 99. MD notified, no changes to be made at this time.

## 2014-09-23 NOTE — Progress Notes (Signed)
NUTRITION FOLLOW-UP  DOCUMENTATION CODES Per approved criteria  -Non-severe (moderate) malnutrition in the context of chronic illness  Pt meets criteria for non-severe moderate malnutrition in the context of chronic illness as evidenced by 11.8% weight loss in 3 months, and mild subcutaneous body fat and muscle mass depletion.  INTERVENTION: Continue providing Magic Cup BID, each provides 290 kcal, and 9 grams of protein  Provide MVI  NUTRITION DIAGNOSIS: Increased nutrient needs related to LVAD as evidenced by estimated energy needs; ongoing  Goal: Pt to meet >/= 90% of estimated energy needs; not met  Monitor:  O2 device, diet advancement, PO intake, weight trends, labs, I/O's   ASSESSMENT: 69 y/o with history of CAD with stenting of LCX 2013, DM2, carotid stenting and ankylosing spondylitis. Presented with an anterolateral MI in 12/15 and was discharged with a life vest and medical therapy. Pt was admitted with worsening heart failure symptoms and has undergone right heart catheterization and is now going to be started on Milrinone and then LVAD.   3/22- Echocardiogram transesophageal; insertion of implantable left ventricle assist device 3/23- Extubated on BiPAP with no complications; pleural CT removed 3/24- Remains on BiPAP for CO2 retention secondary to probably restrictive lung disease from ankylosing spondylitis; attempting to wean off BiPAP today  Talked with RN about pt. He has only been taking a few bites of Magic Cup d/t BiPAP.  Continue to provide Magic Cup BID and MVI. Will continue to monitor O2 device and diet advancement.  Labs and medications reviewed:  Elevated glucose  Height: Ht Readings from Last 1 Encounters:  09/10/14 5' 8"  (1.727 m)    Weight: Wt Readings from Last 1 Encounters:  09/23/14 194 lb 7.1 oz (88.2 kg)    BMI:  Body mass index is 29.57 kg/(m^2). overweight  Estimated Nutritional Needs: Kcal: 1950-2150 Protein: 105-125 grams Fluid:  >/= 1.5L daily  Skin: intact  Diet Order: Diet full liquid   Intake/Output Summary (Last 24 hours) at 09/23/14 0926 Last data filed at 09/23/14 0900  Gross per 24 hour  Intake 3363.6 ml  Output   3570 ml  Net -206.4 ml    Last BM: 3/21  Labs:   Recent Labs Lab 09/21/14 2200 09/22/14 0300  09/22/14 1658 09/22/14 1710 09/23/14 0053 09/23/14 0400  NA 136 137  < > 135  --  135 136  K 3.9 4.0  < > 4.2  --  4.9 4.3  CL 97 98  < > 99  --  96 98  CO2 28 31  --   --   --   --  29  BUN 16 13  < > 17  --  28* 18  CREATININE 1.06 0.91  < > 1.10 1.04 1.20 1.03  CALCIUM 9.2 8.8  --   --   --   --  8.4  MG 2.7* 2.5  --   --  2.2  --  2.6*  PHOS  --  3.6  --   --   --   --  4.4  GLUCOSE 165* 115*  < > 111*  --  147* 136*  < > = values in this interval not displayed.  CBG (last 3)   Recent Labs  09/23/14 0420 09/23/14 0530 09/23/14 0650  GLUCAP 142* 187* 145*    Scheduled Meds: . acetaminophen  1,000 mg Oral 4 times per day   Or  . acetaminophen (TYLENOL) oral liquid 160 mg/5 mL  1,000 mg Per Tube 4 times per  day  . antiseptic oral rinse  7 mL Mouth Rinse q12n4p  . aspirin EC  325 mg Oral Daily   Or  . aspirin  324 mg Per Tube Daily  . bisacodyl  10 mg Oral Daily   Or  . bisacodyl  10 mg Rectal Daily  . cefTAZidime (FORTAZ)  IV  1 g Intravenous 3 times per day  . [START ON 09/24/2014] chlorhexidine  15 mL Mouth Rinse BID  . docusate sodium  200 mg Oral Daily  . insulin aspart  0-24 Units Subcutaneous 6 times per day  . insulin detemir  12 Units Subcutaneous BID  . insulin regular  0-10 Units Intravenous TID WC  . levalbuterol  1.25 mg Nebulization Q6H  . metoCLOPramide (REGLAN) injection  10 mg Intravenous 4 times per day  . multivitamin with minerals  1 tablet Oral Daily  . pantoprazole (PROTONIX) IV  40 mg Intravenous Daily  . rosuvastatin  10 mg Oral q1800  . sodium chloride  10-40 mL Intracatheter Q12H  . sodium chloride  10-40 mL Intracatheter Q12H  .  sodium chloride  3 mL Intravenous Q12H  . vancomycin  1,000 mg Intravenous Q12H  . warfarin  5 mg Oral q1800  . Warfarin - Physician Dosing Inpatient   Does not apply q1800    Continuous Infusions: . sodium chloride 20 mL/hr at 09/23/14 0900  . sodium chloride    . sodium chloride 20 mL/hr at 09/23/14 0900  . amiodarone 30 mg/hr (09/23/14 0900)  . DOPamine 2.5 mcg/kg/min (09/23/14 0900)  . epinephrine 1 mcg/min (09/23/14 0900)  . furosemide (LASIX) infusion 6 mg/hr (09/23/14 0900)  . lactated ringers 20 mL/hr at 09/23/14 0900  . lactated ringers    . milrinone 0.3 mcg/kg/min (09/23/14 0900)  . norepinephrine (LEVOPHED) Adult infusion Stopped (09/22/14 1330)     Wynona Dove, MS Dietetic Intern Pager: [phone]

## 2014-09-23 NOTE — Progress Notes (Signed)
HeartMate 2 Rounding Note  Subjective:    Post Day 2- HMII LVAD   Extubated. Struggling with CO2 retention due to stiff chest. Has been on/off bipap. Chest sore. Diuresing well. Weaning pressors. LVAD speed increased to 9000. Episode of AF and IV amio restarted overnight  CVP 10-12  LVAD INTERROGATION:  HeartMate II LVAD:  Flow 5.0 liters/min, speed 8800, power 5.0, PI 5.5  No PI events over night.     Objective:    Vital Signs:   Temp:  [97.2 F (36.2 C)-99.1 F (37.3 C)] 99.1 F (37.3 C) (03/24 1553) Pulse Rate:  [27-133] 79 (03/24 1615) Resp:  [0-37] 13 (03/24 1615) SpO2:  [82 %-100 %] 100 % (03/24 1615) FiO2 (%):  [40 %] 40 % (03/24 1337) Weight:  [194 lb 7.1 oz (88.2 kg)] 194 lb 7.1 oz (88.2 kg) (03/24 0500) Last BM Date: 09/20/14 Mean arterial Pressure  68  Intake/Output:   Intake/Output Summary (Last 24 hours) at 09/23/14 1648 Last data filed at 09/23/14 1600  Gross per 24 hour  Intake 3188.5 ml  Output   3242 ml  Net  -53.5 ml     Physical Exam: General:  Extubated, awake HEENT: normal Neck: supple. RIJ swan  Carotids 2+ bilat; no bruits. No lymphadenopathy or thryomegaly appreciated. RIJ Swan  Cor: Mechanical heart sounds with LVAD hum present. Sternal dressing 4 mediastinal tubes.  Lungs: clear Abdomen: soft, nontender, nondistended. No hepatosplenomegaly. No bruits or masses. Good bowel sounds. Driveline: Dressing with minimal bloody exudate noted.  Extremities: no cyanosis, clubbing, rash, tr edema R and LLE SCDs  RUE A-line  Neuro: Awake follows commands GU: Foley   Telemetry: NSR 80s   Labs: Basic Metabolic Panel:  Recent Labs Lab 09/21/14 0019 09/21/14 0450 09/21/14 0740  09/21/14 2200 09/22/14 0300 09/22/14 1437 09/22/14 1658 09/22/14 1710 09/23/14 0053 09/23/14 0400  NA  --  126* 131*  < > 136 137 137 135  --  135 136  K  --  4.6 5.6*  < > 3.9 4.0 4.4 4.2  --  4.9 4.3  CL  --  78* 85*  < > 97 98 96 99  --  96 98  CO2  --  37* 38*   --  28 31  --   --   --   --  29  GLUCOSE  --  482* 312*  < > 165* 115* 146* 111*  --  147* 136*  BUN  --  18 19  < > 16 13 16 17   --  28* 18  CREATININE  --  1.08 1.15  < > 1.06 0.91 1.10 1.10 1.04 1.20 1.03  CALCIUM  --  8.1* 8.3*  --  9.2 8.8  --   --   --   --  8.4  MG 1.5  --   --   --  2.7* 2.5  --   --  2.2  --  2.6*  PHOS  --   --   --   --   --  3.6  --   --   --   --  4.4  < > = values in this interval not displayed.  Liver Function Tests:  Recent Labs Lab 09/16/14 1700 09/22/14 0300 09/23/14 0400  AST 46* 149* 246*  ALT 62* 94* 168*  ALKPHOS 226* 107 124*  BILITOT 1.2 3.0* 3.4*  PROT 7.7 5.9* 6.3  ALBUMIN 3.0* 3.2* 3.0*   No results for input(s): LIPASE, AMYLASE  in the last 168 hours. No results for input(s): AMMONIA in the last 168 hours.  CBC:  Recent Labs Lab 09/21/14 0450  09/21/14 1225  09/21/14 2200 09/22/14 0300 09/22/14 1437 09/22/14 1658 09/22/14 1710 09/23/14 0053 09/23/14 0400  WBC 13.3*  --   --   --  16.8* 13.3*  --   --  14.9*  --  15.8*  NEUTROABS  --   --   --   --   --  10.9*  --   --   --   --  13.9*  HGB 10.7*  < > 7.5*  < > 8.2* 7.8* 10.5* 10.2* 9.0* 10.5* 8.8*  HCT 35.0*  < > 24.0*  < > 25.7* 24.2* 31.0* 30.0* 29.1* 31.0* 28.5*  MCV 92.3  --   --   --  87.4 86.7  --   --  89.5  --  90.5  PLT 374  --  206  --  199 176  --   --  176  --  181  < > = values in this interval not displayed.  INR:  Recent Labs Lab 09/20/14 1635 09/21/14 0450 09/21/14 1647 09/22/14 0300 09/23/14 0400  INR 1.25 1.43 1.42 1.42 1.44    Other results:    Imaging: Dg Chest Port 1 View  09/23/2014   CLINICAL DATA:  Left ventricular assist device.  EXAM: PORTABLE CHEST - 1 VIEW  COMPARISON:  09/22/2014.  FINDINGS: Luiz Blare scan catheter, mediastinal drainage catheter, right PICC line, bilateral chest tubes in stable position. Left ventricular assist device in stable position. Cardiomegaly. Interim improvement of pulmonary vascular prominence and  interstitial prominence suggesting improving congestive heart failure. Small bilateral pleural effusions with fissural fluid on the right. Bibasilar atelectasis. No pneumothorax.  IMPRESSION: 1. Lines and tubes including bilateral chest tubes in stable position. Left ventricular assist device in stable position. No pneumothorax. 2. Interim partial clearing of congestive heart failure and pulmonary interstitial edema. Persistent small pleural effusions with fissural fluid on the right again noted.   Electronically Signed   By: Marcello Moores  Register   On: 09/23/2014 07:40   Dg Chest Port 1 View  09/22/2014   CLINICAL DATA:  Right PICC placement.  Initial encounter.  EXAM: PORTABLE CHEST - 1 VIEW  COMPARISON:  Chest radiograph performed earlier today at 5:59 a.m.  FINDINGS: The patient's right PICC is noted ending about the cavoatrial junction. A right IJ Swan-Ganz catheter is seen ending overlying the right main pulmonary artery near the pulmonary outflow tract. Bilateral chest tubes are noted in unchanged position. A mediastinal drain is again noted. A left ventricular assist device is partially characterized.  Fissural fluid on the right is stable in appearance. New left mid lung airspace opacity could reflect atelectasis or possibly pneumonia. Right midlung opacity is mildly improved. Retrocardiac airspace opacification is stable in appearance. A small right pleural effusion is again noted. No pneumothorax is identified. Mild vascular congestion is seen.  The cardiomediastinal silhouette is enlarged. The patient is status post median sternotomy.  IMPRESSION: 1. Right PICC noted ending about the cavoatrial junction. 2. New left midlung airspace opacity could reflect atelectasis or possibly pneumonia. 3. Stable appearance to small right pleural effusion, with associated fissural fluid. Interval improvement in right mid lung opacity. Stable retrocardiac opacity may reflect atelectasis. 4. Mild vascular congestion and  cardiomegaly noted.   Electronically Signed   By: Garald Balding M.D.   On: 09/22/2014 17:55   Dg Chest [facility]  09/22/2014   CLINICAL DATA:  Status post LVAD  EXAM: PORTABLE CHEST - 1 VIEW  COMPARISON:  Chest x-ray from yesterday  FINDINGS: The LVAD is minimally visualized, with the outflow port is having a stable appearance.  An aortic balloon pump has been removed. Swan-Ganz catheter from the right, tip directed towards the right main pulmonary artery. Thoracic drains remain. Endotracheal tube tip between the clavicular heads. The gastric suction tube reaches the stomach. There is no evidence of air leak. Bibasilar atelectasis and fissural fluid on the right is unchanged. No pulmonary edema. Stable cardiomegaly.  IMPRESSION: 1. Tubes and remaining line in unchanged position. 2. Unchanged basilar atelectasis and fissural fluid.   Electronically Signed   By: Monte Fantasia M.D.   On: 09/22/2014 08:05   Dg Chest Port 1 View  09/21/2014   CLINICAL DATA:  Left ventricular assist device placement.  EXAM: PORTABLE CHEST - 1 VIEW  COMPARISON:  09/21/2014 at 4:35 a.m.  FINDINGS: Endotracheal tube tip 3.6 cm above the carina. Mediastinal drain in place. Right internal jugular Swan-Ganz catheter just into the right pulmonary artery. Bilateral chest tubes are in place. Prior right PICC line is no longer seen. Nasogastric tube terminates in the stomach.  Intra-aortic balloon pump projects at an just below the level of the aortic arch. Left ventricular assist device inflow valve observed, with much of the device below the area of imaging.  Continued airspace opacity at both lung bases, stable. The interstitial edema shown on the prior exam is minimally improved. Stable enlargement of the cardiopericardial silhouette.  No significant pneumothorax. A skin fold projects over the right chest laterally.  IMPRESSION: 1. Interval intubation, bilateral chest tube placement, mediastinal drain placement, Swan-Ganz catheter  placement, and LVAD placement. No malpositioning identified. No significant pneumothorax. Intra aortic balloon pump projects at and just below the level of the aortic arch. 2. Stable bibasilar airspace opacities. The underlying interstitial edema is improved.   Electronically Signed   By: Van Clines M.D.   On: 09/21/2014 17:27     Medications:     Scheduled Medications: . acetaminophen  1,000 mg Oral 4 times per day   Or  . acetaminophen (TYLENOL) oral liquid 160 mg/5 mL  1,000 mg Per Tube 4 times per day  . antiseptic oral rinse  7 mL Mouth Rinse q12n4p  . aspirin EC  325 mg Oral Daily   Or  . aspirin  324 mg Per Tube Daily  . bisacodyl  10 mg Oral Daily   Or  . bisacodyl  10 mg Rectal Daily  . cefTAZidime (FORTAZ)  IV  1 g Intravenous 3 times per day  . [START ON 09/24/2014] chlorhexidine  15 mL Mouth Rinse BID  . docusate sodium  200 mg Oral Daily  . insulin aspart  0-24 Units Subcutaneous 6 times per day  . insulin detemir  12 Units Subcutaneous BID  . insulin regular  0-10 Units Intravenous TID WC  . levalbuterol  1.25 mg Nebulization Q6H  . metoCLOPramide (REGLAN) injection  10 mg Intravenous 4 times per day  . multivitamin with minerals  1 tablet Oral Daily  . pantoprazole (PROTONIX) IV  40 mg Intravenous Daily  . rosuvastatin  10 mg Oral q1800  . sodium chloride  10-40 mL Intracatheter Q12H  . sodium chloride  10-40 mL Intracatheter Q12H  . sodium chloride  3 mL Intravenous Q12H  . vancomycin  1,000 mg Intravenous Q12H  . warfarin  5 mg Oral q1800  .  Warfarin - Physician Dosing Inpatient   Does not apply q1800    Infusions: . sodium chloride 20 mL/hr at 09/23/14 1600  . sodium chloride    . sodium chloride 20 mL/hr at 09/23/14 1600  . amiodarone 30 mg/hr (09/23/14 1400)  . DOPamine 2 mcg/kg/min (09/23/14 1600)  . epinephrine Stopped (09/23/14 1230)  . furosemide (LASIX) infusion Stopped (09/23/14 1200)  . lactated ringers 20 mL/hr at 09/23/14 1600  .  lactated ringers    . milrinone 0.3 mcg/kg/min (09/23/14 1400)  . norepinephrine (LEVOPHED) Adult infusion Stopped (09/22/14 1330)    PRN Medications: sodium chloride, fentaNYL, ketorolac, midazolam, ondansetron (ZOFRAN) IV, sodium chloride, sodium chloride, sodium chloride, traMADol   Assessment:  1. S/P HMII LVAD 09/21/14  2.  A/C systolic HF class IV with cardiogenic shock- EF 15% with moderate RV dysfunction 3/16 echo.  3.  CAD s/p Anterior STEMI on 06/12/14 with stenting of LAD- on coumadin + plavix, statin.  4.  06/12/14 VT in setting of STEMI 5.  Ankylosing spondylitis. 6. Bilateral PE with R DVT- S/P IVC filter 08/27/2014--->coumadin 7.  Asystole noted from Life Vest 8.  LV thrombus 9.  Full Code 10. Hemoptysis, resolved 11. A/C renal failure, resolved 12. Pleural effusion, transudative  13. ?PNA 14. Acute Blood Loss- Anemia- Received 1UPRBCs 3/23  Plan/Discussion:   POD #2 HMII LVAD.   Overall doing ok. Struggling with CO2 retention. Continue BIPAP as needed. Had episode of AF last night and now back on IV amio. Has diuresed well can cut back lasix. Wean pressors as tolerated.  Will need to restart Plavix soon  The patient is critically ill with multiple organ systems failure and requires high complexity decision making for assessment and support, frequent evaluation and titration of therapies, application of advanced monitoring technologies and extensive interpretation of multiple databases.   Critical Care Time devoted to patient care services described in this note is 35 Minutes.   I reviewed the LVAD parameters from today, and compared the results to the patient's prior recorded data.  No programming changes were made.  The LVAD is functioning within specified parameters.  The patient performs LVAD self-test daily.  LVAD interrogation was negative for any significant power changes, alarms or PI events/speed drops.  LVAD equipment check completed and is in good  working order.  Back-up equipment present.   LVAD education done on emergency procedures and precautions and reviewed exit site care.  Length of Stay: 13  [name]  MD  09/23/2014, 4:48 PM  VAD Team --- VAD ISSUES ONLY--- Pager [phone] (7am - 7am)  Advanced Heart Failure Team  Pager [phone] (M-F; 7a - 4p)  Please contact [facility] for night-coverage after hours (4p -7a ) and weekends on [email or web address]

## 2014-09-23 NOTE — Telephone Encounter (Signed)
Cardiac rehab orders faxed.

## 2014-09-23 NOTE — Progress Notes (Signed)
HeartMate 2 Rounding Note  Subjective:   POD #2 VAD implant  (DT)  For acute /chronic systolic HF from ischemic cardiomyopathy and cardiogenic shock on preop IABP  Recent PE- with large R pulmonary infarct,necrotic    lung/pneumonia- poss empyema Restrictive lung disease- FEV1 1.3 DM  HX ventricular, atrial arrhythmia  C02,  retention treated with BIPAP and improving Elevated PA pressures last pm treated wit increased Nitric oxide,milrinone and pump speed to 9000- lasix drip 6 mg C0-0x this am  73 %   LVAD INTERROGATION:  HeartMate II LVAD:  Flow 5.1 liters/min, speed 9000 rpm power 4.1, PI  6.0  Controller intact  Objective:    Vital Signs:   Temp:  [96.6 F (35.9 C)-99.7 F (37.6 C)] 97.2 F (36.2 C) (03/24 0730) Pulse Rate:  [27-133] 82 (03/24 0730) Resp:  [0-37] 25 (03/24 0730) SpO2:  [82 %-100 %] 100 % (03/24 0730) FiO2 (%):  [40 %-60 %] 40 % (03/24 0600) Weight:  [194 lb 7.1 oz (88.2 kg)] 194 lb 7.1 oz (88.2 kg) (03/24 0500) Last BM Date: 09/20/14 Mean arterial Pressure 70  Intake/Output:   Intake/Output Summary (Last 24 hours) at 09/23/14 0751 Last data filed at 09/23/14 0700  Gross per 24 hour  Intake 3141.53 ml  Output   3175 ml  Net -33.47 ml     Physical Exam: General:  Alert on BIPAP HEENT: normal Neck: supple. JVP . Carotids 2+ bilat; no bruits. No lymphadenopathy or thryomegaly appreciated. Cor: Mechanical heart sounds with LVAD hum present. Lungs: coars bs on R Abdomen: soft, nontender, nondistended. No hepatosplenomegaly. No bruits or masses. Good bowel sounds. Driveline: C/D/I; securement device intact and driveline incorporated Extremities: no cyanosis, clubbing, rash, edema- IABP out Neuro: moves all extremities   Telemetry:nsr  Labs: Basic Metabolic Panel:  Recent Labs Lab 09/20/14 0420 09/21/14 0015 09/21/14 0019 09/21/14 0450 09/21/14 0740  09/22/14 0300 09/22/14 1437 09/22/14 1658 09/22/14 1710 09/23/14 0053 09/23/14 0400   NA 134* 138  --  126* 131*  < > 137 137 135  --  135 136  K 4.0 2.6*  --  4.6 5.6*  < > 4.0 4.4 4.2  --  4.9 4.3  CL 91* 98  --  78* 85*  < > 98 96 99  --  96 98  CO2 36* 35*  --  37* 38*  --  31  --   --   --   --  29  GLUCOSE 127* 219*  --  482* 312*  < > 115* 146* 111*  --  147* 136*  BUN 11 14  --  18 19  < > 13 16 17   --  28* 18  CREATININE 0.88 0.71  --  1.08 1.15  < > 0.91 1.10 1.10 1.04 1.20 1.03  CALCIUM 8.6 6.5*  --  8.1* 8.3*  --  8.8  --   --   --   --  8.4  MG 2.0  --  1.5  --   --   --  2.5  --   --  2.2  --  2.6*  PHOS  --   --   --   --   --   --  3.6  --   --   --   --  4.4  < > = values in this interval not displayed.  Liver Function Tests:  Recent Labs Lab 09/16/14 1700 09/22/14 0300 09/23/14 0400  AST 46* 149* 246*  ALT  62* 94* 168*  ALKPHOS 226* 107 124*  BILITOT 1.2 3.0* 3.4*  PROT 7.7 5.9* 6.3  ALBUMIN 3.0* 3.2* 3.0*   No results for input(s): LIPASE, AMYLASE in the last 168 hours. No results for input(s): AMMONIA in the last 168 hours.  CBC:  Recent Labs Lab 09/20/14 0420 09/21/14 0450  09/21/14 1225  09/22/14 0300 09/22/14 1437 09/22/14 1658 09/22/14 1710 09/23/14 0053 09/23/14 0400  WBC 12.6* 13.3*  --   --   --  13.3*  --   --  14.9*  --  15.8*  NEUTROABS  --   --   --   --   --  10.9*  --   --   --   --  13.9*  HGB 9.8* 10.7*  < > 7.5*  < > 7.8* 10.5* 10.2* 9.0* 10.5* 8.8*  HCT 32.6* 35.0*  < > 24.0*  < > 24.2* 31.0* 30.0* 29.1* 31.0* 28.5*  MCV 92.6 92.3  --   --   --  86.7  --   --  89.5  --  90.5  PLT 394 374  --  206  --  176  --   --  176  --  181  < > = values in this interval not displayed.  INR:  Recent Labs Lab 09/20/14 1635 09/21/14 0450 09/21/14 1647 09/22/14 0300 09/23/14 0400  INR 1.25 1.43 1.42 1.42 1.44    Other results:  EKG:   Imaging: Dg Chest Port 1 View  09/23/2014   CLINICAL DATA:  Left ventricular assist device.  EXAM: PORTABLE CHEST - 1 VIEW  COMPARISON:  09/22/2014.  FINDINGS: Luiz Blare scan catheter,  mediastinal drainage catheter, right PICC line, bilateral chest tubes in stable position. Left ventricular assist device in stable position. Cardiomegaly. Interim improvement of pulmonary vascular prominence and interstitial prominence suggesting improving congestive heart failure. Small bilateral pleural effusions with fissural fluid on the right. Bibasilar atelectasis. No pneumothorax.  IMPRESSION: 1. Lines and tubes including bilateral chest tubes in stable position. Left ventricular assist device in stable position. No pneumothorax. 2. Interim partial clearing of congestive heart failure and pulmonary interstitial edema. Persistent small pleural effusions with fissural fluid on the right again noted.   Electronically Signed   By: Marcello Moores  Register   On: 09/23/2014 07:40   Dg Chest Port 1 View  09/22/2014   CLINICAL DATA:  Right PICC placement.  Initial encounter.  EXAM: PORTABLE CHEST - 1 VIEW  COMPARISON:  Chest radiograph performed earlier today at 5:59 a.m.  FINDINGS: The patient's right PICC is noted ending about the cavoatrial junction. A right IJ Swan-Ganz catheter is seen ending overlying the right main pulmonary artery near the pulmonary outflow tract. Bilateral chest tubes are noted in unchanged position. A mediastinal drain is again noted. A left ventricular assist device is partially characterized.  Fissural fluid on the right is stable in appearance. New left mid lung airspace opacity could reflect atelectasis or possibly pneumonia. Right midlung opacity is mildly improved. Retrocardiac airspace opacification is stable in appearance. A small right pleural effusion is again noted. No pneumothorax is identified. Mild vascular congestion is seen.  The cardiomediastinal silhouette is enlarged. The patient is status post median sternotomy.  IMPRESSION: 1. Right PICC noted ending about the cavoatrial junction. 2. New left midlung airspace opacity could reflect atelectasis or possibly pneumonia. 3. Stable  appearance to small right pleural effusion, with associated fissural fluid. Interval improvement in right mid lung opacity. Stable retrocardiac opacity may reflect  atelectasis. 4. Mild vascular congestion and cardiomegaly noted.   Electronically Signed   By: Garald Balding M.D.   On: 09/22/2014 17:55   Dg Chest Port 1 View  09/22/2014   CLINICAL DATA:  Status post LVAD  EXAM: PORTABLE CHEST - 1 VIEW  COMPARISON:  Chest x-ray from yesterday  FINDINGS: The LVAD is minimally visualized, with the outflow port is having a stable appearance.  An aortic balloon pump has been removed. Swan-Ganz catheter from the right, tip directed towards the right main pulmonary artery. Thoracic drains remain. Endotracheal tube tip between the clavicular heads. The gastric suction tube reaches the stomach. There is no evidence of air leak. Bibasilar atelectasis and fissural fluid on the right is unchanged. No pulmonary edema. Stable cardiomegaly.  IMPRESSION: 1. Tubes and remaining line in unchanged position. 2. Unchanged basilar atelectasis and fissural fluid.   Electronically Signed   By: Monte Fantasia M.D.   On: 09/22/2014 08:05   Dg Chest Port 1 View  09/21/2014   CLINICAL DATA:  Left ventricular assist device placement.  EXAM: PORTABLE CHEST - 1 VIEW  COMPARISON:  09/21/2014 at 4:35 a.m.  FINDINGS: Endotracheal tube tip 3.6 cm above the carina. Mediastinal drain in place. Right internal jugular Swan-Ganz catheter just into the right pulmonary artery. Bilateral chest tubes are in place. Prior right PICC line is no longer seen. Nasogastric tube terminates in the stomach.  Intra-aortic balloon pump projects at an just below the level of the aortic arch. Left ventricular assist device inflow valve observed, with much of the device below the area of imaging.  Continued airspace opacity at both lung bases, stable. The interstitial edema shown on the prior exam is minimally improved. Stable enlargement of the cardiopericardial  silhouette.  No significant pneumothorax. A skin fold projects over the right chest laterally.  IMPRESSION: 1. Interval intubation, bilateral chest tube placement, mediastinal drain placement, Swan-Ganz catheter placement, and LVAD placement. No malpositioning identified. No significant pneumothorax. Intra aortic balloon pump projects at and just below the level of the aortic arch. 2. Stable bibasilar airspace opacities. The underlying interstitial edema is improved.   Electronically Signed   By: Van Clines M.D.   On: 09/21/2014 17:27     Medications:     Scheduled Medications: . acetaminophen  1,000 mg Oral 4 times per day   Or  . acetaminophen (TYLENOL) oral liquid 160 mg/5 mL  1,000 mg Per Tube 4 times per day  . antiseptic oral rinse  7 mL Mouth Rinse QID  . aspirin EC  325 mg Oral Daily   Or  . aspirin  324 mg Per Tube Daily  . bisacodyl  10 mg Oral Daily   Or  . bisacodyl  10 mg Rectal Daily  . cefTAZidime (FORTAZ)  IV  1 g Intravenous 3 times per day  . chlorhexidine  15 mL Mouth Rinse BID  . docusate sodium  200 mg Oral Daily  . insulin aspart  0-24 Units Subcutaneous 6 times per day  . insulin detemir  12 Units Subcutaneous BID  . insulin regular  0-10 Units Intravenous TID WC  . levalbuterol  1.25 mg Nebulization Q6H  . metoCLOPramide (REGLAN) injection  10 mg Intravenous 4 times per day  . multivitamin with minerals  1 tablet Oral Daily  . pantoprazole (PROTONIX) IV  40 mg Intravenous Daily  . rosuvastatin  10 mg Oral q1800  . sodium chloride  10-40 mL Intracatheter Q12H  . sodium chloride  10-40  mL Intracatheter Q12H  . sodium chloride  3 mL Intravenous Q12H  . vancomycin  1,000 mg Intravenous Q12H  . warfarin  5 mg Oral q1800  . Warfarin - Physician Dosing Inpatient   Does not apply q1800    Infusions: . sodium chloride 20 mL/hr at 09/22/14 0800  . sodium chloride    . sodium chloride 20 mL (09/23/14 0174)  . amiodarone 30 mg/hr (09/23/14 0700)  .  DOPamine 2.5 mcg/kg/min (09/23/14 0700)  . epinephrine 2 mcg/min (09/23/14 0700)  . furosemide (LASIX) infusion 6 mg/hr (09/23/14 0700)  . lactated ringers 20 mL (09/22/14 2010)  . lactated ringers    . milrinone 0.3 mcg/kg/min (09/23/14 0700)  . norepinephrine (LEVOPHED) Adult infusion Stopped (09/22/14 1330)    PRN Medications: sodium chloride, fentaNYL, ketorolac, midazolam, ondansetron (ZOFRAN) IV, sodium chloride, sodium chloride, sodium chloride, traMADol   Assessment:  Stable hemodynamics- no PI events C02 retention better- wean bipap po coumadin ordered this pm- min chest tube output Cont pressors for RV, wean slowly Filling pressures better- cont lasix drip   Plan/Discussion:   Will DC PA cath to mobilize OOB to help pulmonary status Cont coumadin Wean epi and dopamine  I reviewed the LVAD parameters from today, and compared the results to the patient's prior recorded data.  No programming changes were made.  The LVAD is functioning within specified parameters.  The patient performs LVAD self-test daily.  LVAD interrogation was negative for any significant power changes, alarms or PI events/speed drops.  LVAD equipment check completed and is in good working order.  Back-up equipment present.   LVAD education done on emergency procedures and precautions and reviewed exit site care.  Length of Stay: Mingus III 09/23/2014, 7:51 AM  VAD Team --- VAD ISSUES ONLY--- Pager [phone] (7am - 7am)  Advanced Heart Failure Team  Pager [phone] (M-F; 7a - 4p)  Please contact [facility] for night-coverage after hours (4p -7a ) and weekends on [email or web address]

## 2014-09-23 NOTE — Progress Notes (Signed)
Updated Dr. [name] on patient status since previous phone call. Discussed vitals, LVAD numbers, map, and urine output. Orders received to start Lasix drip, Amiodarone bolus (patient had a run of vtach, in and out of a-fib, and frequent PACs, PVCs,) decrease Epi drip to 2, decrease Dopamine drip to 2.54mcg, increase Nitric to 8ppm, Magnesium 2gm IVPB, and to increase LVAD speed to 9000. Goal PA pressure numbers to be 40s/20s. If MAP continues to be elevated over 90, may start IV nitroglycerin if needed. Will continue to closely monitor. Richarda Blade RN

## 2014-09-23 NOTE — Evaluation (Signed)
Physical Therapy Evaluation Patient Details Name: Scott [name] MRN: [id number] DOB: 1946/05/27 Today's Date: 09/23/2014   History of Present Illness  69 y.o. M with extensive cardiac hx as well as recent PE / DVT, admitted for SOB due to heart failure, cardiogenic, shock, PE. Thought to be good candidate for VAD (to be evaluated by VAD team 3/17). Found to have mod - large R pleural effusion. LVAD implantation 3/23  Clinical Impression  Pt very pleasant and moving well for first time OOB. Pt very diaphoretic throughout despite cool cloths and temperature turned down. Pt and wife educated for sternal precautions, power source changing and pt able to practice switching to and from battery with min assist and cues for sequence. Pt demonstrated appropriate dexterity for task and good bil LE strength for mobility. Will attempt ambulation next session if RT able to be present to manage nitric. Pt with decreased mobility, activity tolerance and awareness of precautions. Pt will benefit from acute therapy to maximize mobility, function, activity, adherence to precautions and gait for decreased burden of care and increased independence for return home. Pt also educated for back up back and self test. Wife present throughout.    Follow Up Recommendations Home health PT    Equipment Recommendations  Rolling walker with 5" wheels (rollator)    Recommendations for Other Services       Precautions / Restrictions Precautions Precautions: Sternal Precaution Comments: LVAD Restrictions Weight Bearing Restrictions:  (sternal precautions)      Mobility  Bed Mobility Overal bed mobility: Needs Assistance Bed Mobility: Supine to Sit     Supine to sit: HOB elevated;Min assist     General bed mobility comments: cues for sequence and safety to pivot to EOB  Transfers Overall transfer level: Needs assistance   Transfers: Sit to/from Stand;Stand Pivot Transfers Sit to Stand: Min  assist Stand pivot transfers: Min guard       General transfer comment: cues for hand placement, sequence and pivot to chair without holding anything  Ambulation/Gait Ambulation/Gait assistance:  (deferred secondary to lines and nitric at this time)              Science writer    Modified Rankin (Stroke Patients Only)       Balance Overall balance assessment: No apparent balance deficits (not formally assessed)                                           Pertinent Vitals/Pain Pain Assessment: No/denies pain  HR 89 sats 93% on 40% venturi    Home Living Family/patient expects to be discharged to:: Private residence Living Arrangements: Spouse/significant other Available Help at Discharge: Family Type of Home: House Home Access: Stairs to enter Entrance Stairs-Rails: None Entrance Stairs-Number of Steps: 2 Home Layout: Two level;Able to live on main level with bedroom/bathroom Home Equipment: None      Prior Function Level of Independence: Independent               Hand Dominance        Extremity/Trunk Assessment   Upper Extremity Assessment: Defer to OT evaluation           Lower Extremity Assessment: Overall WFL for tasks assessed      Cervical / Trunk Assessment: Normal  Communication   Communication: No difficulties  Cognition Arousal/Alertness: Awake/alert Behavior During Therapy: Flat affect Overall Cognitive Status: Within Functional Limits for tasks assessed                      General Comments      Exercises        Assessment/Plan    PT Assessment Patient needs continued PT services  PT Diagnosis Difficulty walking   PT Problem List Decreased activity tolerance;Decreased knowledge of precautions;Decreased mobility;Decreased knowledge of use of DME  PT Treatment Interventions Gait training;DME instruction;Functional mobility training;Patient/family education;Stair  training;Therapeutic activities;Therapeutic exercise   PT Goals (Current goals can be found in the Care Plan section) Acute Rehab PT Goals Patient Stated Goal: work on cars PT Goal Formulation: With patient/family Time For Goal Achievement: 10/07/14 Potential to Achieve Goals: Good    Frequency Min 3X/week   Barriers to discharge        Co-evaluation PT/OT/SLP Co-Evaluation/Treatment: Yes Reason for Co-Treatment: Complexity of the patient's impairments (multi-system involvement);For patient/therapist safety PT goals addressed during session: Mobility/safety with mobility         End of Session Equipment Utilized During Treatment: Oxygen Activity Tolerance: Patient tolerated treatment well Patient left: in chair;with call bell/phone within reach;with nursing/sitter in room;with family/visitor present Nurse Communication: Mobility status;Precautions         Time: 0539-7673 PT Time Calculation (min) (ACUTE ONLY): 34 min   Charges:   PT Evaluation $Initial PT Evaluation Tier I: 1 Procedure PT Treatments $Therapeutic Activity: 8-22 mins   PT G CodesMelford Aase 09/23/2014, 11:20 AM  Elwyn Reach, [place]

## 2014-09-23 NOTE — Progress Notes (Signed)
Hypoglycemic Event  CBG: 59  Treatment: D50 IV 25 mL  Symptoms: None  Follow-up CBG: Time:1500 CBG Result:111  Possible Reasons for Event: Inadequate meal intake  Comments/MD notified:Levemir dose decreased    Scott [name]  Remember to initiate Hypoglycemia Order Set & complete

## 2014-09-23 NOTE — Evaluation (Signed)
Occupational Therapy Evaluation Patient Details Name: Scott [name] MRN: [id number] DOB: [birth year] Today's Date: 09/23/2014    History of Present Illness 69 y.o. M with extensive cardiac hx as well as recent PE / DVT, admitted for SOB due to heart failure, cardiogenic, shock, PE. Thought to be good candidate for VAD (to be evaluated by VAD team 3/17). Found to have mod - large R pleural effusion. LVAD implantation 3/23   Clinical Impression   Pt was independent prior to admission.  He presents with generalized weakness, decreased activity tolerance and impaired balance interfering with ability to perform ADL and ADL transfers.  Began instruction in testing and switching from outlet to battery power and in sternal precautions.  Pt diaphoretic throughout session. Wife is highly attentive and participating is session. Will follow acutely.    Follow Up Recommendations  Home health OT    Equipment Recommendations  3 in 1 bedside comode    Recommendations for Other Services       Precautions / Restrictions Precautions Precautions: Sternal;Fall Precaution Comments: LVAD      Mobility Bed Mobility Overal bed mobility: Needs Assistance Bed Mobility: Supine to Sit     Supine to sit: HOB elevated;Min assist     General bed mobility comments: cues for sequence and safety to pivot to EOB  Transfers Overall transfer level: Needs assistance   Transfers: Sit to/from Stand;Stand Pivot Transfers Sit to Stand: Min assist Stand pivot transfers: Min guard       General transfer comment: cues for hand placement, sequence and pivot to chair without holding anything    Balance Overall balance assessment: Needs assistance Sitting-balance support: Feet supported Sitting balance-Leahy Scale: Fair       Standing balance-Leahy Scale: Poor                              ADL Overall ADL's : Needs assistance/impaired Eating/Feeding: Set up;Sitting Eating/Feeding  Details (indicate cue type and reason): drink Grooming: Wash/dry face;Set up;Sitting Grooming Details (indicate cue type and reason): pt diaphoretic, wiped own head/face with washcloth Upper Body Bathing: Maximal assistance;Sitting   Lower Body Bathing: Maximal assistance;Sit to/from stand   Upper Body Dressing : Moderate assistance;Sitting   Lower Body Dressing: Maximal assistance;Sit to/from stand                       Vision     Perception     Praxis      Pertinent Vitals/Pain Pain Assessment: No/denies pain     Hand Dominance Right   Extremity/Trunk Assessment Upper Extremity Assessment Upper Extremity Assessment: Generalized weakness   Lower Extremity Assessment Lower Extremity Assessment: Defer to PT evaluation   Cervical / Trunk Assessment Cervical / Trunk Assessment: Normal   Communication Communication Communication: No difficulties   Cognition Arousal/Alertness: Awake/alert Behavior During Therapy: Flat affect Overall Cognitive Status: Within Functional Limits for tasks assessed                     General Comments       Exercises       Shoulder Instructions      Home Living Family/patient expects to be discharged to:: Private residence Living Arrangements: Spouse/significant other Available Help at Discharge: Family;Available 24 hours/day Type of Home: House Home Access: Stairs to enter [employer] of Steps: 2 Entrance Stairs-Rails: None Home Layout: Two level;Able to live on main level with bedroom/bathroom  Bathroom Shower/Tub: Occupational psychologist: Standard     Home Equipment: None          Prior Functioning/Environment Level of Independence: Independent             OT Diagnosis: Generalized weakness   OT Problem List: Decreased strength;Decreased activity tolerance;Impaired balance (sitting and/or standing);Decreased knowledge of use of DME or AE;Cardiopulmonary status limiting  activity   OT Treatment/Interventions: Self-care/ADL training;Energy conservation;Therapeutic activities;Patient/family education;Balance training    OT Goals(Current goals can be found in the care plan section) Acute Rehab OT Goals Patient Stated Goal: work on cars OT Goal Formulation: With patient Time For Goal Achievement: 10/07/14 Potential to Achieve Goals: Good ADL Goals Pt Will Perform Grooming: with supervision;standing Pt Will Perform Upper Body Bathing: with supervision;sitting Pt Will Perform Lower Body Bathing: with supervision;sit to/from stand Pt Will Perform Upper Body Dressing: sitting;with supervision Pt Will Perform Lower Body Dressing: with supervision;sit to/from stand Pt Will Transfer to Toilet: with supervision;ambulating Pt Will Perform Toileting - Clothing Manipulation and hygiene: with supervision;sit to/from stand Additional ADL Goal #1: Pt will utilize energy conservation strategies in ADL and mobility independently.  OT Frequency: Min 2X/week   Barriers to D/C:            Co-evaluation PT/OT/SLP Co-Evaluation/Treatment: Yes Reason for Co-Treatment: Complexity of the patient's impairments (multi-system involvement);For patient/therapist safety PT goals addressed during session: Mobility/safety with mobility OT goals addressed during session: Strengthening/ROM      End of Session Equipment Utilized During Treatment: Oxygen Nurse Communication: Mobility status  Activity Tolerance: Patient limited by fatigue Patient left: in chair;with call bell/phone within reach;with family/visitor present;with nursing/sitter in room   Time: 1024-1059 OT Time Calculation (min): 35 min Charges:  OT General Charges $OT Visit: 1 Procedure OT Evaluation $Initial OT Evaluation Tier I: 1 Procedure G-Codes:    [name] 09/23/2014, 12:34 PM  [phone]

## 2014-09-23 NOTE — Progress Notes (Signed)
Admitted 09/10/14 with worsening heart failure and cardiogenic shock.  HeartMate II LVAD implated on 09/21/14 by Dr. Darcey Nora as destination therapy VAD.   Vital signs: Temp:  96.6 - 99.7 HR:  88 Arterial line:  68 - 88 O2 Sat:  96 - 100 Wt in lbs:  173 > 194 > 194.7   LVAD interrogation reveals:  Speed:  9000 Flow: 5.0 Power:  4.7 PI:  6.3 Alarms: none Events:  none Fixed speed:  9000 Low speed limit: 8400  Blood products: OR:  1 unit PC, 2 plts, 3 FFP ICU: 09/22/14 - 1 unit PC   Gtts: Dopamine 2.5 Milrinone .3 Epinephrine 2 Amiodarone 30 Lasix 6  Nitric Oxide:  6 PPM (weaned from 8 PPM this am)  Drive Line: gauze dressing dry and intac; attachment device in place  ASA:  325 mg started 09/22/14  CVP:  13 > 10   Labs:  LDH trend: 364>496  INR trend: 1.42>1.44 (coumadin 5 mg yesterday)  Co-Ox:  73  WBC:  13.3>15.8  Hgb:  7.8 (received one unit PC)>9.0>8.8  Plan/Recommendations as discussed with VAD team:  1.  PA pressures up last night - speed increased to 9000 and lasix gtt added. Diuresed 1800 cc since gtt started.        2.  6 beat run VT along with intermittent afib last night; amio bolus given, amio gtt remains 30 mg/hr.  3.  Per Dr. Darcey Nora - wean bi-pap as tolerated; OOB to chair; PT/cardiac rehab work with patient.

## 2014-09-24 ENCOUNTER — Inpatient Hospital Stay (HOSPITAL_COMMUNITY): Payer: Medicare Other

## 2014-09-24 LAB — TYPE AND SCREEN
ABO/RH(D): A POS
Antibody Screen: NEGATIVE
Unit division: 0
Unit division: 0
Unit division: 0
Unit division: 0

## 2014-09-24 LAB — CULTURE, RESPIRATORY W GRAM STAIN
Culture: NO GROWTH
Special Requests: NORMAL

## 2014-09-24 LAB — CBC WITH DIFFERENTIAL/PLATELET
Basophils Absolute: 0 10*3/uL (ref 0.0–0.1)
Basophils Relative: 0 % (ref 0–1)
Eosinophils Absolute: 0.1 10*3/uL (ref 0.0–0.7)
Eosinophils Relative: 0 % (ref 0–5)
HCT: 25.7 % — ABNORMAL LOW (ref 39.0–52.0)
Hemoglobin: 8 g/dL — ABNORMAL LOW (ref 13.0–17.0)
Lymphocytes Relative: 5 % — ABNORMAL LOW (ref 12–46)
Lymphs Abs: 0.8 10*3/uL (ref 0.7–4.0)
MCH: 27.6 pg (ref 26.0–34.0)
MCHC: 31.1 g/dL (ref 30.0–36.0)
MCV: 88.6 fL (ref 78.0–100.0)
Monocytes Absolute: 1.1 10*3/uL — ABNORMAL HIGH (ref 0.1–1.0)
Monocytes Relative: 8 % (ref 3–12)
Neutro Abs: 12.8 10*3/uL — ABNORMAL HIGH (ref 1.7–7.7)
Neutrophils Relative %: 87 % — ABNORMAL HIGH (ref 43–77)
Platelets: 183 10*3/uL (ref 150–400)
RBC: 2.9 MIL/uL — ABNORMAL LOW (ref 4.22–5.81)
RDW: 17.2 % — ABNORMAL HIGH (ref 11.5–15.5)
WBC: 14.8 10*3/uL — ABNORMAL HIGH (ref 4.0–10.5)

## 2014-09-24 LAB — POCT I-STAT 3, ART BLOOD GAS (G3+)
ACID-BASE EXCESS: 10 mmol/L — AB (ref 0.0–2.0)
Acid-Base Excess: 5 mmol/L — ABNORMAL HIGH (ref 0.0–2.0)
Bicarbonate: 35.9 mEq/L — ABNORMAL HIGH (ref 20.0–24.0)
Bicarbonate: 31.2 mEq/L — ABNORMAL HIGH (ref 20.0–24.0)
O2 SAT: 97 %
O2 Saturation: 99 %
PH ART: 7.401 (ref 7.350–7.450)
PO2 ART: 131 mmHg — AB (ref 80.0–100.0)
Patient temperature: 97.6
Patient temperature: 97.9
TCO2: 38 mmol/L (ref 0–100)
TCO2: 33 mmol/L (ref 0–100)
pCO2 arterial: 51.2 mmHg — ABNORMAL HIGH (ref 35.0–45.0)
pCO2 arterial: 50.1 mmHg — ABNORMAL HIGH (ref 35.0–45.0)
pH, Arterial: 7.452 — ABNORMAL HIGH (ref 7.350–7.450)
pO2, Arterial: 90 mmHg (ref 80.0–100.0)

## 2014-09-24 LAB — COMPREHENSIVE METABOLIC PANEL
ALT: 162 U/L — ABNORMAL HIGH (ref 0–53)
AST: 170 U/L — ABNORMAL HIGH (ref 0–37)
Albumin: 2.5 g/dL — ABNORMAL LOW (ref 3.5–5.2)
Alkaline Phosphatase: 123 U/L — ABNORMAL HIGH (ref 39–117)
Anion gap: 6 (ref 5–15)
BUN: 30 mg/dL — ABNORMAL HIGH (ref 6–23)
CO2: 32 mmol/L (ref 19–32)
Calcium: 8.4 mg/dL (ref 8.4–10.5)
Chloride: 96 mmol/L (ref 96–112)
Creatinine, Ser: 1.13 mg/dL (ref 0.50–1.35)
GFR calc Af Amer: 76 mL/min — ABNORMAL LOW (ref >90)
GFR calc non Af Amer: 66 mL/min — ABNORMAL LOW (ref >90)
Glucose, Bld: 64 mg/dL — ABNORMAL LOW (ref 70–99)
Potassium: 4.1 mmol/L (ref 3.5–5.1)
Sodium: 134 mmol/L — ABNORMAL LOW (ref 135–145)
Total Bilirubin: 2.2 mg/dL — ABNORMAL HIGH (ref 0.3–1.2)
Total Protein: 5.7 g/dL — ABNORMAL LOW (ref 6.0–8.3)

## 2014-09-24 LAB — GLUCOSE, CAPILLARY
GLUCOSE-CAPILLARY: 137 mg/dL — AB (ref 70–99)
GLUCOSE-CAPILLARY: 273 mg/dL — AB (ref 70–99)
GLUCOSE-CAPILLARY: 75 mg/dL (ref 70–99)
Glucose-Capillary: 111 mg/dL — ABNORMAL HIGH (ref 70–99)
Glucose-Capillary: 88 mg/dL (ref 70–99)
Glucose-Capillary: 92 mg/dL (ref 70–99)
Glucose-Capillary: 115 mg/dL — ABNORMAL HIGH (ref 70–99)
Glucose-Capillary: 173 mg/dL — ABNORMAL HIGH (ref 70–99)

## 2014-09-24 LAB — MAGNESIUM: Magnesium: 2.2 mg/dL (ref 1.5–2.5)

## 2014-09-24 LAB — CARBOXYHEMOGLOBIN
Carboxyhemoglobin: 1.2 % (ref 0.5–1.5)
Carboxyhemoglobin: 1 % (ref 0.5–1.5)
Methemoglobin: 1.2 % (ref 0.0–1.5)
Methemoglobin: 1 % (ref 0.0–1.5)
O2 Saturation: 65.8 %
O2 Saturation: 72.7 %
Total hemoglobin: 8 g/dL — ABNORMAL LOW (ref 13.5–18.0)
Total hemoglobin: 8.3 g/dL — ABNORMAL LOW (ref 13.5–18.0)

## 2014-09-24 LAB — PROTIME-INR
INR: 1.6 — ABNORMAL HIGH (ref 0.00–1.49)
PROTHROMBIN TIME: 19.2 s — AB (ref 11.6–15.2)

## 2014-09-24 LAB — BLOOD GAS, ARTERIAL
Acid-Base Excess: 5.9 mmol/L — ABNORMAL HIGH (ref 0.0–2.0)
Bicarbonate: 31.1 mEq/L — ABNORMAL HIGH (ref 20.0–24.0)
Nitric Oxide: 2
O2 Content: 4 L/min
O2 Saturation: 99.7 %
Patient temperature: 98.6
TCO2: 32.8 mmol/L (ref 0–100)
pCO2 arterial: 56.4 mmHg — ABNORMAL HIGH (ref 35.0–45.0)
pH, Arterial: 7.361 (ref 7.350–7.450)
pO2, Arterial: 183 mmHg — ABNORMAL HIGH (ref 80.0–100.0)

## 2014-09-24 LAB — ACETYLCHOLINE RECEPTOR, BINDING: Acety choline binding ab: <0.03 nmol/L (ref 0.00–0.24)

## 2014-09-24 LAB — PHOSPHORUS: Phosphorus: 2.5 mg/dL (ref 2.3–4.6)

## 2014-09-24 LAB — STRIATED MUSCLE ANTIBODY: Anti-striation Abs: NEGATIVE

## 2014-09-24 LAB — LACTATE DEHYDROGENASE: LDH: 380 U/L — ABNORMAL HIGH (ref 94–250)

## 2014-09-24 MED ORDER — POTASSIUM CHLORIDE 10 MEQ/50ML IV SOLN
10.0000 meq | INTRAVENOUS | Status: AC
  Administered 2014-09-24 (×2): 10 meq via INTRAVENOUS

## 2014-09-24 MED ORDER — TRACE MINERALS CR-CU-F-FE-I-MN-MO-SE-ZN IV SOLN
INTRAVENOUS | Status: AC
  Administered 2014-09-24: 18:00:00 via INTRAVENOUS
  Filled 2014-09-24: qty 720

## 2014-09-24 MED ORDER — DEXTROSE 70 % IV SOLN: INTRAVENOUS | Status: DC

## 2014-09-24 MED ORDER — FAT EMULSION 20 % IV EMUL
240.0000 mL | INTRAVENOUS | Status: AC
  Administered 2014-09-24: 240 mL via INTRAVENOUS
  Filled 2014-09-24: qty 250

## 2014-09-24 MED ORDER — WARFARIN SODIUM 5 MG PO TABS
5.0000 mg | ORAL_TABLET | Freq: Every day | ORAL | Status: DC
  Administered 2014-09-24 – 2014-09-26 (×3): 5 mg via ORAL
  Filled 2014-09-24 (×4): qty 1

## 2014-09-24 MED ORDER — MILRINONE IN DEXTROSE 20 MG/100ML IV SOLN
0.2500 ug/kg/min | INTRAVENOUS | Status: DC
  Administered 2014-09-24: 0.25 ug/kg/min via INTRAVENOUS
  Filled 2014-09-24: qty 100

## 2014-09-24 MED ORDER — DEXTROSE 50 % IV SOLN
25.0000 mL | Freq: Once | INTRAVENOUS | Status: AC
  Administered 2014-09-24: 25 mL via INTRAVENOUS

## 2014-09-24 MED ORDER — DEXTROSE 50 % IV SOLN
INTRAVENOUS | Status: AC
  Administered 2014-09-24: 25 mL via INTRAVENOUS
  Filled 2014-09-24: qty 50

## 2014-09-24 MED ORDER — FUROSEMIDE 10 MG/ML IJ SOLN
40.0000 mg | Freq: Every day | INTRAMUSCULAR | Status: DC
  Administered 2014-09-24: 40 mg via INTRAVENOUS
  Filled 2014-09-24 (×2): qty 4

## 2014-09-24 MED ORDER — SODIUM PHOSPHATE 3 MMOLE/ML IV SOLN
10.0000 mmol | Freq: Once | INTRAVENOUS | Status: AC
  Administered 2014-09-24: 10 mmol via INTRAVENOUS
  Filled 2014-09-24: qty 3.33

## 2014-09-24 MED ORDER — DIGOXIN 0.25 MG/ML IJ SOLN
0.1250 mg | Freq: Every day | INTRAMUSCULAR | Status: DC
  Administered 2014-09-24: 0.125 mg via INTRAVENOUS
  Filled 2014-09-24: qty 0.5

## 2014-09-24 NOTE — Progress Notes (Signed)
OT Cancellation Note  Patient Details Name: Scott [name] MRN: [id number] DOB: Apr 13, 1946   Cancelled Treatment:    Reason Eval/Treat Not Completed: Fatigue/lethargy limiting ability to participate.  Will reattempt.   Darlina Rumpf Pony, OTR/L [phone]  09/24/2014, 3:00 PM

## 2014-09-24 NOTE — Progress Notes (Signed)
HeartMate 2 Rounding Note  Subjective:   POD #3  VAD implant  (DT)  For acute /chronic systolic HF from ischemic cardiomyopathy and cardiogenic shock on preop IABP  Recent PE- with large R pulmonary infarct,necrotic    lung/pneumonia- poss empyema Restrictive lung disease- FEV1 1.3 from ankylosing spondylitis DM  HX ventricular, atrial arrhythmia  C02,  retention treated with BIPAP and improving Patient was able to ambulate with BiPAP 70 feet We'll try to wean BiPAP and inhaled nitric oxide today  Hemodynamics stable-wean off all pressors except for milrinone. Maintaining sinus rhythm. Continue amiodarone, resume preoperative digoxin and wean off IV Cardizem. Pump flows 4.5-5 L. Mixed venous saturation greater than 60%, good urine output. CVP 10-12 cm H2O  Chest x-ray today appears wet-IV Lasix ordered  Chest tubes with minimal output-will remove right pleural tube, we've left pleural tube and pocket drain to suction  LVAD INTERROGATION:  HeartMate II LVAD:  Flow 5.1 liters/min, speed 9000 rpm power 4.1, PI  6.0  Controller intact  Objective:    Vital Signs:   Temp:  [96.9 F (36.1 C)-99.1 F (37.3 C)] 96.9 F (36.1 C) (03/25 1147) Pulse Rate:  [25-130] 78 (03/25 1100) Resp:  [0-32] 32 (03/25 1100) BP: (94)/(81) 94/81 mmHg (03/24 1657) SpO2:  [89 %-100 %] 100 % (03/25 1100) FiO2 (%):  [30 %-40 %] 30 % (03/25 0915) Weight:  [192 lb 7.4 oz (87.3 kg)] 192 lb 7.4 oz (87.3 kg) (03/25 0615) Last BM Date: 09/20/14 Mean arterial Pressure 75  Intake/Output:   Intake/Output Summary (Last 24 hours) at 09/24/14 1159 Last data filed at 09/24/14 1100  Gross per 24 hour  Intake 2756.83 ml  Output   1212 ml  Net 1544.83 ml     Physical Exam: General:  Alert on BIPAP HEENT: normal Neck: supple. JVP . Carotids 2+ bilat; no bruits. No lymphadenopathy or thryomegaly appreciated. Cor: Mechanical heart sounds with LVAD hum present. Lungs: coars bs on R, moving better air  today Abdomen: soft, nontender, nondistended. No hepatosplenomegaly. No bruits or masses. Good bowel sounds. Driveline: C/D/I; securement device intact and driveline incorporated Extremities: warm well perfused,no cyanosis, clubbing, rash, edema- IABP out without hematoma and groin Neuro: moves all extremities   Telemetry:nsr  Labs: Basic Metabolic Panel:  Recent Labs Lab 09/21/14 0740  09/21/14 2200 09/22/14 0300  09/22/14 1658 09/22/14 1710 09/23/14 0053 09/23/14 0400 09/23/14 1642 09/24/14 0445  NA 131*  < > 136 137  < > 135  --  135 136 134* 134*  K 5.6*  < > 3.9 4.0  < > 4.2  --  4.9 4.3 3.8 4.1  CL 85*  < > 97 98  < > 99  --  96 98 96 96  CO2 38*  --  28 31  --   --   --   --  29  --  32  GLUCOSE 312*  < > 165* 115*  < > 111*  --  147* 136* 112* 64*  BUN 19  < > 16 13  < > 17  --  28* 18 26* 30*  CREATININE 1.15  < > 1.06 0.91  < > 1.10 1.04 1.20 1.03 1.30 1.13  CALCIUM 8.3*  --  9.2 8.8  --   --   --   --  8.4  --  8.4  MG  --   --  2.7* 2.5  --   --  2.2  --  2.6*  --  2.2  PHOS  --   --   --  3.6  --   --   --   --  4.4  --  2.5  < > = values in this interval not displayed.  Liver Function Tests:  Recent Labs Lab 09/22/14 0300 09/23/14 0400 09/24/14 0445  AST 149* 246* 170*  ALT 94* 168* 162*  ALKPHOS 107 124* 123*  BILITOT 3.0* 3.4* 2.2*  PROT 5.9* 6.3 5.7*  ALBUMIN 3.2* 3.0* 2.5*   No results for input(s): LIPASE, AMYLASE in the last 168 hours. No results for input(s): AMMONIA in the last 168 hours.  CBC:  Recent Labs Lab 09/21/14 2200 09/22/14 0300  09/22/14 1710 09/23/14 0053 09/23/14 0400 09/23/14 1642 09/24/14 0445  WBC 16.8* 13.3*  --  14.9*  --  15.8*  --  14.8*  NEUTROABS  --  10.9*  --   --   --  13.9*  --  12.8*  HGB 8.2* 7.8*  < > 9.0* 10.5* 8.8* 9.9* 8.0*  HCT 25.7* 24.2*  < > 29.1* 31.0* 28.5* 29.0* 25.7*  MCV 87.4 86.7  --  89.5  --  90.5  --  88.6  PLT 199 176  --  176  --  181  --  183  < > = values in this interval not  displayed.  INR:  Recent Labs Lab 09/21/14 0450 09/21/14 1647 09/22/14 0300 09/23/14 0400 09/24/14 0445  INR 1.43 1.42 1.42 1.44 1.60*    Other results:  EKG:   Imaging: Dg Chest Port 1 View  09/24/2014   CLINICAL DATA:  Left ventricular assist device  EXAM: PORTABLE CHEST - 1 VIEW  COMPARISON:  09/23/2014  FINDINGS: Cardiac shadow remains enlarged. An LVAD is again identified and stable. Swan-Ganz catheter has been removed in the interval as has the mediastinal drain. Thoracostomy catheters remain bilaterally as does a right-sided PICC line and right jugular sheath. Rounded area of density is noted laterally on the right stable from the prior exam and possibly representing some fluid within the fissures. No pneumothorax is seen. No focal infiltrate is noted.  IMPRESSION: Removal of the mediastinal drain when compared with the prior exam.  Stable density in the lateral right lung base. No new focal abnormality is seen.   Electronically Signed   By: Inez Catalina M.D.   On: 09/24/2014 08:14   Dg Chest Port 1 View  09/23/2014   CLINICAL DATA:  Left ventricular assist device.  EXAM: PORTABLE CHEST - 1 VIEW  COMPARISON:  09/22/2014.  FINDINGS: Luiz Blare scan catheter, mediastinal drainage catheter, right PICC line, bilateral chest tubes in stable position. Left ventricular assist device in stable position. Cardiomegaly. Interim improvement of pulmonary vascular prominence and interstitial prominence suggesting improving congestive heart failure. Small bilateral pleural effusions with fissural fluid on the right. Bibasilar atelectasis. No pneumothorax.  IMPRESSION: 1. Lines and tubes including bilateral chest tubes in stable position. Left ventricular assist device in stable position. No pneumothorax. 2. Interim partial clearing of congestive heart failure and pulmonary interstitial edema. Persistent small pleural effusions with fissural fluid on the right again noted.   Electronically Signed   By:  Marcello Moores  Register   On: 09/23/2014 07:40   Dg Chest Port 1 View  09/22/2014   CLINICAL DATA:  Right PICC placement.  Initial encounter.  EXAM: PORTABLE CHEST - 1 VIEW  COMPARISON:  Chest radiograph performed earlier today at 5:59 a.m.  FINDINGS: The patient's right PICC is noted ending about the cavoatrial junction.  A right IJ Swan-Ganz catheter is seen ending overlying the right main pulmonary artery near the pulmonary outflow tract. Bilateral chest tubes are noted in unchanged position. A mediastinal drain is again noted. A left ventricular assist device is partially characterized.  Fissural fluid on the right is stable in appearance. New left mid lung airspace opacity could reflect atelectasis or possibly pneumonia. Right midlung opacity is mildly improved. Retrocardiac airspace opacification is stable in appearance. A small right pleural effusion is again noted. No pneumothorax is identified. Mild vascular congestion is seen.  The cardiomediastinal silhouette is enlarged. The patient is status post median sternotomy.  IMPRESSION: 1. Right PICC noted ending about the cavoatrial junction. 2. New left midlung airspace opacity could reflect atelectasis or possibly pneumonia. 3. Stable appearance to small right pleural effusion, with associated fissural fluid. Interval improvement in right mid lung opacity. Stable retrocardiac opacity may reflect atelectasis. 4. Mild vascular congestion and cardiomegaly noted.   Electronically Signed   By: Garald Balding M.D.   On: 09/22/2014 17:55     Medications:     Scheduled Medications: . acetaminophen  1,000 mg Oral 4 times per day   Or  . acetaminophen (TYLENOL) oral liquid 160 mg/5 mL  1,000 mg Per Tube 4 times per day  . antiseptic oral rinse  7 mL Mouth Rinse q12n4p  . aspirin EC  325 mg Oral Daily   Or  . aspirin  324 mg Per Tube Daily  . bisacodyl  10 mg Oral Daily   Or  . bisacodyl  10 mg Rectal Daily  . cefTAZidime (FORTAZ)  IV  1 g Intravenous 3  times per day  . chlorhexidine  15 mL Mouth Rinse BID  . digoxin  0.125 mg Intravenous Daily  . docusate sodium  200 mg Oral Daily  . furosemide  40 mg Intravenous Daily  . insulin aspart  0-24 Units Subcutaneous 6 times per day  . levalbuterol  1.25 mg Nebulization Q6H  . metoCLOPramide (REGLAN) injection  10 mg Intravenous 4 times per day  . pantoprazole (PROTONIX) IV  40 mg Intravenous Daily  . rosuvastatin  10 mg Oral q1800  . sodium chloride  10-40 mL Intracatheter Q12H  . sodium chloride  10-40 mL Intracatheter Q12H  . sodium chloride  3 mL Intravenous Q12H  . sodium phosphate  Dextrose 5% IVPB  10 mmol Intravenous Once  . vancomycin  1,000 mg Intravenous Q12H  . Warfarin - Physician Dosing Inpatient   Does not apply q1800    Infusions: . sodium chloride 20 mL/hr at 09/24/14 1100  . sodium chloride    . sodium chloride 20 mL/hr at 09/23/14 1900  . amiodarone 30 mg/hr (09/24/14 1100)  . DOPamine Stopped (09/24/14 0900)  . Marland KitchenTPN (CLINIMIX-E) Adult     And  . fat emulsion    . lactated ringers 20 mL/hr at 09/24/14 1100  . lactated ringers    . milrinone 0.3 mcg/kg/min (09/24/14 1100)    PRN Medications: sodium chloride, fentaNYL, ketorolac, midazolam, ondansetron (ZOFRAN) IV, sodium chloride, sodium chloride, sodium chloride, traMADol   Assessment:  Stable hemodynamics- no PI events C02 retention better- wean bipap as tolerated and check blood gas po coumadin ordered, Plavix resumed for LAD PCI Postop ileus, continued BiPAP requirement, we'll start TPN for nutritional needs until ileus resolves and BiPAP not needed Cont milrinone for RV function Daily IV Lasix as needed   Plan/Discussion:   DC Cordis sheath DC right pleural tube Wean BiPAP and  nitric oxide Ambulate with physical therapy INR slowly rising, 1.6 on Coumadin 5 mg daily  I reviewed the LVAD parameters from today, and compared the results to the patient's prior recorded data.  No programming changes  were made.  The LVAD is functioning within specified parameters.  The patient performs LVAD self-test daily.  LVAD interrogation was negative for any significant power changes, alarms or PI events/speed drops.  LVAD equipment check completed and is in good working order.  Back-up equipment present.   LVAD education done on emergency procedures and precautions and reviewed exit site care.  Length of Stay: Duncan III 09/24/2014, 11:59 AM  VAD Team --- VAD ISSUES ONLY--- Pager [phone] (7am - 7am)  Advanced Heart Failure Team  Pager [phone] (M-F; 7a - 4p)  Please contact [facility] for night-coverage after hours (4p -7a ) and weekends on [email or web address]

## 2014-09-24 NOTE — Progress Notes (Signed)
Patient walked by PT, RT, and RN's. Vitals stable.

## 2014-09-24 NOTE — Progress Notes (Signed)
Admitted 09/10/14 with worsening heart failure and cardiogenic shock.  HeartMate II LVAD implated on 09/21/14 by Dr. Darcey Nora as DT VAD.   Vital signs: Temp:  99.1 - 97.6 (axillary) HR:  75 - 83 (last recorded in atrial fib 1800 last PM) Arterial line:  68 - 78 (aline)  O2 Sat:  94 - 100 CVP:   Wt in lbs:  173 > 194 > 194.7 > 192.7   LVAD interrogation reveals:  Speed:  9000 Flow: 5.0 Power:  4.7 PI:  6.3 Alarms: none Events:  none Fixed speed:  9000 Low speed limit: 8400  Blood products: OR:  1 unit PC, 2 plts, 3 FFP ICU: 09/22/14 - 1 unit PC   Gtts: Dopamine 82mcg/kg/min Milrinone 0.32mcg/kg/min Amiodarone 30 mg/hr (plan to convert to PO soon)   Nitric Oxide:  3 PPM this morning on BiPAP.   ASA:  325 mg started 09/22/14  Warfarin: 5 mg started 09/22/14  Plavix: not started back yet (LAD stent 06/2014)  CVP:  8 - 10 today    Labs:  LDH trend: 364 > 496 > 380  INR trend: 1.42 > 1.44 > 1.6   Co-Ox:  73 > 65.8 (1 mcg/kg/min Dopamine, 0.3 mcg/kg/min Milrinone)  WBC:  13.3 > 15.8 > 14.8   Hgb:  7.8 (received one unit PC) > 9.0 > 8.8 > 8.0 today  Drive Line: gauze dressing dry and intact; attachment device in place. To be changed today.   Scott [name] is more awake this morning and his hypercarbia has improved significantly on BiPAP based on ABG. Ambulated today 74' with rehab team, bedside RNs and RTs while on BiPAP. Tolerated well. LFTs/Bilirubin are trending down. Still having some low blood sugars requiring D50 injections (being drawn from arterial line since capillary stick was not reliable yesterday).   Scott [name] and myself have discussed his progress with his wife this morning at the bedside. All questions answered   Plan/Recommendations as discussed with VAD team:  1.  Concern for post-operative ileus and starting TPN today to support nutrition until patient able to tolerate enteral feedings or a diet.        2.  D/C Diltiazem infusion, wean off  Dopamine. Continue Milrinone 0.71mcg/kg/min to support RV early post-op. Plans to convert IV Amiodarone to PO soon per Scott [name].   3.  D/W Scott [name], RT and RN about weaning BiPAP down to attempt to get Scott [name] on nasal cannula--follow up ABGs to be drawn to assess progress. Weaning the NO today to off.   4. PT recommending Home Health PT + 24h supervision/assistance based on today's encounter. Will continue to follow up with team's recommendations as he progresses and continues to improve his mobility.

## 2014-09-24 NOTE — Progress Notes (Signed)
NUTRITION FOLLOW-UP  DOCUMENTATION CODES Per approved criteria  -Non-severe (moderate) malnutrition in the context of chronic illness  Pt meets criteria for non-severe moderate malnutrition in the context of chronic illness as evidenced by 11.8% weight loss in 3 months, and mild subcutaneous body fat and muscle mass depletion.  INTERVENTION: TPN per pharmacy  Recommend Full Liquid diet when able, and provide Magic Cup BID, each contains 290 kcal, and 9 grams of protein  RD to follow  NUTRITION DIAGNOSIS: Increased nutrient needs related to LVAD as evidenced by estimated energy needs; ongoing  Goal: Pt to meet >/= 90% of estimated energy needs; not met  Monitor:  O2 device, diet advancement, PO intake, weight trends, labs, I/O's   ASSESSMENT: 68 y/o with history of CAD with stenting of LCX 2013, DM2, carotid stenting and ankylosing spondylitis. Presented with an anterolateral MI in 12/15 and was discharged with a life vest and medical therapy. Pt was admitted with worsening heart failure symptoms and has undergone right heart catheterization and is now going to be started on Milrinone and then LVAD.   3/22- Echocardiogram transesophageal; insertion of implantable left ventricle assist device 3/23- Extubated on BiPAP with no complications; pleural CT removed 3/24- Remains on BiPAP for CO2 retention secondary to probably restrictive lung disease from ankylosing spondylitis; attempting to wean off BiPAP today 2/25- Consulted for TPN/TNA  Talked with RN about pt, and continues to be unsuccessful with weaning off BiPAP. MD ordered TPN. Wife at bedside. Pt denied any abdominal pain or nausea. He felt hungry, and was eager to start eating. Pt wanted to try soup and Magic Cup. Talked with pt and wife about the importance of receiving full energy intake at this time for recovery via TPN.  Will continue to monitor O2 device and diet advancement.  Labs and medications reviewed:  Elevated  BUN Low Na  Height: Ht Readings from Last 1 Encounters:  09/10/14 5' 8" (1.727 m)    Weight: Wt Readings from Last 1 Encounters:  09/24/14 192 lb 7.4 oz (87.3 kg)    BMI:  Body mass index is 29.27 kg/(m^2). overweight  Estimated Nutritional Needs: Kcal: 1950-2150 Protein: 105-125 grams Fluid: >/= 1.5L daily  Skin: intact  Diet Order: Diet NPO time specified Except for: Sips with Meds TPN (CLINIMIX-E) Adult   Intake/Output Summary (Last 24 hours) at 09/24/14 1335 Last data filed at 09/24/14 1230  Gross per 24 hour  Intake 2479.03 ml  Output   1142 ml  Net 1337.03 ml    Last BM: 3/25  Labs:   Recent Labs Lab 09/22/14 0300  09/22/14 1710  09/23/14 0400 09/23/14 1642 09/24/14 0445  NA 137  < >  --   < > 136 134* 134*  K 4.0  < >  --   < > 4.3 3.8 4.1  CL 98  < >  --   < > 98 96 96  CO2 31  --   --   --  29  --  32  BUN 13  < >  --   < > 18 26* 30*  CREATININE 0.91  < > 1.04  < > 1.03 1.30 1.13  CALCIUM 8.8  --   --   --  8.4  --  8.4  MG 2.5  --  2.2  --  2.6*  --  2.2  PHOS 3.6  --   --   --  4.4  --  2.5  GLUCOSE 115*  < >  --   < >   136* 112* 64*  < > = values in this interval not displayed.  CBG (last 3)   Recent Labs  09/23/14 2349 09/24/14 0802 09/24/14 1145  GLUCAP 75 88 92    Scheduled Meds: . acetaminophen  1,000 mg Oral 4 times per day   Or  . acetaminophen (TYLENOL) oral liquid 160 mg/5 mL  1,000 mg Per Tube 4 times per day  . antiseptic oral rinse  7 mL Mouth Rinse q12n4p  . aspirin EC  325 mg Oral Daily   Or  . aspirin  324 mg Per Tube Daily  . bisacodyl  10 mg Oral Daily   Or  . bisacodyl  10 mg Rectal Daily  . cefTAZidime (FORTAZ)  IV  1 g Intravenous 3 times per day  . chlorhexidine  15 mL Mouth Rinse BID  . docusate sodium  200 mg Oral Daily  . furosemide  40 mg Intravenous Daily  . insulin aspart  0-24 Units Subcutaneous 6 times per day  . levalbuterol  1.25 mg Nebulization Q6H  . metoCLOPramide (REGLAN) injection  10  mg Intravenous 4 times per day  . pantoprazole (PROTONIX) IV  40 mg Intravenous Daily  . rosuvastatin  10 mg Oral q1800  . sodium chloride  10-40 mL Intracatheter Q12H  . sodium chloride  10-40 mL Intracatheter Q12H  . sodium chloride  3 mL Intravenous Q12H  . sodium phosphate  Dextrose 5% IVPB  10 mmol Intravenous Once  . vancomycin  1,000 mg Intravenous Q12H  . Warfarin - Physician Dosing Inpatient   Does not apply q1800    Continuous Infusions: . sodium chloride 20 mL/hr at 09/24/14 1100  . sodium chloride    . sodium chloride 20 mL/hr at 09/23/14 1900  . amiodarone 30 mg/hr (09/24/14 1100)  . DOPamine Stopped (09/24/14 0900)  . Marland KitchenTPN (CLINIMIX-E) Adult     And  . fat emulsion    . lactated ringers 20 mL/hr at 09/24/14 1100  . lactated ringers    . milrinone 0.25 mcg/kg/min (09/24/14 1255)     Wynona Dove, MS Dietetic Intern Pager: [phone]

## 2014-09-24 NOTE — Progress Notes (Signed)
Hypoglycemic Event  CBG: 69 at 0421  Treatment: 1/2 amp D50 given at 0436   Symptoms: none  Follow-up CBG: Time: CBG Result:  Possible Reasons for Event: Lantus given. Not enough oral intake  Comments/MD notified: will notify during rounds     Darcel Smalling, Diona Browner  Remember to initiate Hypoglycemia Order Set & complete

## 2014-09-24 NOTE — Progress Notes (Signed)
HeartMate 2 Rounding Note  Subjective:    Post Day 3- HMII LVAD   Off BiPap this am. All drips weaned except milrinone 0.3 mcg and amiodarone at 30 mg per hour. Now in NSR.  Feeling a little better but complaining of pain. On nasal cannula now. Walked hall this am. CVP 9. Got one dose IV lasix today. Co-ox 66%  LVAD INTERROGATION:  HeartMate II LVAD:  Flow 6.0 liters/min, speed 9000 power 5.2, PI 6.3  No PI events over night.     Objective:    Vital Signs:   Temp:  [96.9 F (36.1 C)-99.1 F (37.3 C)] 96.9 F (36.1 C) (03/25 1147) Pulse Rate:  [25-130] 79 (03/25 1150) Resp:  [0-32] 19 (03/25 1150) BP: (94)/(81) 94/81 mmHg (03/24 1657) SpO2:  [94 %-100 %] 98 % (03/25 1150) FiO2 (%):  [30 %-40 %] 30 % (03/25 0915) Weight:  [192 lb 7.4 oz (87.3 kg)] 192 lb 7.4 oz (87.3 kg) (03/25 0615) Last BM Date: 09/20/14 Mean arterial Pressure  80  Intake/Output:   Intake/Output Summary (Last 24 hours) at 09/24/14 1234 Last data filed at 09/24/14 1100  Gross per 24 hour  Intake 2655.63 ml  Output   1197 ml  Net 1458.63 ml     Physical Exam: General:  Awake, HEENT: normal Neck: supple. RIJ cordis Carotids 2+ bilat; no bruits. No lymphadenopathy or thryomegaly appreciated.  Cor: Mechanical heart sounds with LVAD hum present. Sternal dressing intact.   Lungs: clear Abdomen: soft, nontender, nondistended. No hepatosplenomegaly. No bruits or masses. Good bowel sounds. Driveline: Dressing with minimal bloody exudate noted.  Extremities: no cyanosis, clubbing, rash, tr edema R and LLE SCDs  RUE A-line  Neuro: Awake follows commands GU: Foley   Telemetry: NSR 80s   Labs: Basic Metabolic Panel:  Recent Labs Lab 09/21/14 0740  09/21/14 2200 09/22/14 0300  09/22/14 1658 09/22/14 1710 09/23/14 0053 09/23/14 0400 09/23/14 1642 09/24/14 0445  NA 131*  < > 136 137  < > 135  --  135 136 134* 134*  K 5.6*  < > 3.9 4.0  < > 4.2  --  4.9 4.3 3.8 4.1  CL 85*  < > 97 98  < > 99  --  96  98 96 96  CO2 38*  --  28 31  --   --   --   --  29  --  32  GLUCOSE 312*  < > 165* 115*  < > 111*  --  147* 136* 112* 64*  BUN 19  < > 16 13  < > 17  --  28* 18 26* 30*  CREATININE 1.15  < > 1.06 0.91  < > 1.10 1.04 1.20 1.03 1.30 1.13  CALCIUM 8.3*  --  9.2 8.8  --   --   --   --  8.4  --  8.4  MG  --   --  2.7* 2.5  --   --  2.2  --  2.6*  --  2.2  PHOS  --   --   --  3.6  --   --   --   --  4.4  --  2.5  < > = values in this interval not displayed.  Liver Function Tests:  Recent Labs Lab 09/22/14 0300 09/23/14 0400 09/24/14 0445  AST 149* 246* 170*  ALT 94* 168* 162*  ALKPHOS 107 124* 123*  BILITOT 3.0* 3.4* 2.2*  PROT 5.9* 6.3 5.7*  ALBUMIN  3.2* 3.0* 2.5*   No results for input(s): LIPASE, AMYLASE in the last 168 hours. No results for input(s): AMMONIA in the last 168 hours.  CBC:  Recent Labs Lab 09/21/14 2200 09/22/14 0300  09/22/14 1710 09/23/14 0053 09/23/14 0400 09/23/14 1642 09/24/14 0445  WBC 16.8* 13.3*  --  14.9*  --  15.8*  --  14.8*  NEUTROABS  --  10.9*  --   --   --  13.9*  --  12.8*  HGB 8.2* 7.8*  < > 9.0* 10.5* 8.8* 9.9* 8.0*  HCT 25.7* 24.2*  < > 29.1* 31.0* 28.5* 29.0* 25.7*  MCV 87.4 86.7  --  89.5  --  90.5  --  88.6  PLT 199 176  --  176  --  181  --  183  < > = values in this interval not displayed.  INR:  Recent Labs Lab 09/21/14 0450 09/21/14 1647 09/22/14 0300 09/23/14 0400 09/24/14 0445  INR 1.43 1.42 1.42 1.44 1.60*    Other results:    Imaging: Dg Chest Port 1 View  09/24/2014   CLINICAL DATA:  Left ventricular assist device  EXAM: PORTABLE CHEST - 1 VIEW  COMPARISON:  09/23/2014  FINDINGS: Cardiac shadow remains enlarged. An LVAD is again identified and stable. Swan-Ganz catheter has been removed in the interval as has the mediastinal drain. Thoracostomy catheters remain bilaterally as does a right-sided PICC line and right jugular sheath. Rounded area of density is noted laterally on the right stable from the prior  exam and possibly representing some fluid within the fissures. No pneumothorax is seen. No focal infiltrate is noted.  IMPRESSION: Removal of the mediastinal drain when compared with the prior exam.  Stable density in the lateral right lung base. No new focal abnormality is seen.   Electronically Signed   By: Inez Catalina M.D.   On: 09/24/2014 08:14   Dg Chest Port 1 View  09/23/2014   CLINICAL DATA:  Left ventricular assist device.  EXAM: PORTABLE CHEST - 1 VIEW  COMPARISON:  09/22/2014.  FINDINGS: Luiz Blare scan catheter, mediastinal drainage catheter, right PICC line, bilateral chest tubes in stable position. Left ventricular assist device in stable position. Cardiomegaly. Interim improvement of pulmonary vascular prominence and interstitial prominence suggesting improving congestive heart failure. Small bilateral pleural effusions with fissural fluid on the right. Bibasilar atelectasis. No pneumothorax.  IMPRESSION: 1. Lines and tubes including bilateral chest tubes in stable position. Left ventricular assist device in stable position. No pneumothorax. 2. Interim partial clearing of congestive heart failure and pulmonary interstitial edema. Persistent small pleural effusions with fissural fluid on the right again noted.   Electronically Signed   By: Marcello Moores  Register   On: 09/23/2014 07:40   Dg Chest Port 1 View  09/22/2014   CLINICAL DATA:  Right PICC placement.  Initial encounter.  EXAM: PORTABLE CHEST - 1 VIEW  COMPARISON:  Chest radiograph performed earlier today at 5:59 a.m.  FINDINGS: The patient's right PICC is noted ending about the cavoatrial junction. A right IJ Swan-Ganz catheter is seen ending overlying the right main pulmonary artery near the pulmonary outflow tract. Bilateral chest tubes are noted in unchanged position. A mediastinal drain is again noted. A left ventricular assist device is partially characterized.  Fissural fluid on the right is stable in appearance. New left mid lung airspace  opacity could reflect atelectasis or possibly pneumonia. Right midlung opacity is mildly improved. Retrocardiac airspace opacification is stable in appearance. A small right pleural  effusion is again noted. No pneumothorax is identified. Mild vascular congestion is seen.  The cardiomediastinal silhouette is enlarged. The patient is status post median sternotomy.  IMPRESSION: 1. Right PICC noted ending about the cavoatrial junction. 2. New left midlung airspace opacity could reflect atelectasis or possibly pneumonia. 3. Stable appearance to small right pleural effusion, with associated fissural fluid. Interval improvement in right mid lung opacity. Stable retrocardiac opacity may reflect atelectasis. 4. Mild vascular congestion and cardiomegaly noted.   Electronically Signed   By: Garald Balding M.D.   On: 09/22/2014 17:55     Medications:     Scheduled Medications: . acetaminophen  1,000 mg Oral 4 times per day   Or  . acetaminophen (TYLENOL) oral liquid 160 mg/5 mL  1,000 mg Per Tube 4 times per day  . antiseptic oral rinse  7 mL Mouth Rinse q12n4p  . aspirin EC  325 mg Oral Daily   Or  . aspirin  324 mg Per Tube Daily  . bisacodyl  10 mg Oral Daily   Or  . bisacodyl  10 mg Rectal Daily  . cefTAZidime (FORTAZ)  IV  1 g Intravenous 3 times per day  . chlorhexidine  15 mL Mouth Rinse BID  . digoxin  0.125 mg Intravenous Daily  . docusate sodium  200 mg Oral Daily  . furosemide  40 mg Intravenous Daily  . insulin aspart  0-24 Units Subcutaneous 6 times per day  . levalbuterol  1.25 mg Nebulization Q6H  . metoCLOPramide (REGLAN) injection  10 mg Intravenous 4 times per day  . pantoprazole (PROTONIX) IV  40 mg Intravenous Daily  . rosuvastatin  10 mg Oral q1800  . sodium chloride  10-40 mL Intracatheter Q12H  . sodium chloride  10-40 mL Intracatheter Q12H  . sodium chloride  3 mL Intravenous Q12H  . sodium phosphate  Dextrose 5% IVPB  10 mmol Intravenous Once  . vancomycin  1,000 mg  Intravenous Q12H  . Warfarin - Physician Dosing Inpatient   Does not apply q1800    Infusions: . sodium chloride 20 mL/hr at 09/24/14 1100  . sodium chloride    . sodium chloride 20 mL/hr at 09/23/14 1900  . amiodarone 30 mg/hr (09/24/14 1100)  . DOPamine Stopped (09/24/14 0900)  . Marland KitchenTPN (CLINIMIX-E) Adult     And  . fat emulsion    . lactated ringers 20 mL/hr at 09/24/14 1100  . lactated ringers    . milrinone 0.3 mcg/kg/min (09/24/14 1100)    PRN Medications: sodium chloride, fentaNYL, ketorolac, midazolam, ondansetron (ZOFRAN) IV, sodium chloride, sodium chloride, sodium chloride, traMADol   Assessment:  1. S/P HMII LVAD 09/21/14  2.  A/C systolic HF class IV with cardiogenic shock- EF 15% with moderate RV dysfunction 3/16 echo.  3.  CAD s/p Anterior STEMI on 06/12/14 with stenting of LAD- on coumadin + plavix, statin.  4.  06/12/14 VT in setting of STEMI 5.  Ankylosing spondylitis. 6. Bilateral PE with R DVT- S/P IVC filter 08/27/2014--->coumadin 7.  Asystole noted from Life Vest 8.  LV thrombus 9.  Full Code 10. Hemoptysis, resolved 11. A/C renal failure, resolved 12. Pleural effusion, transudative  13. ?PNA 14. Acute Blood Loss- Anemia- Received 1UPRBCs 3/23  Plan/Discussion:   POD #3 HMII LVAD.   Overall doing better. Off BiPap and on 2 liters [place]. Continue lasix 40 mg daily. Weight down 2 pounds. Renal function stable. CO-OX 65%   Will need to restart Plavix soon  The  patient is critically ill with multiple organ systems failure and requires high complexity decision making for assessment and support, frequent evaluation and titration of therapies, application of advanced monitoring technologies and extensive interpretation of multiple databases.   Critical Care Time devoted to patient care services described in this note is 35 Minutes.   I reviewed the LVAD parameters from today, and compared the results to the patient's prior recorded data.  No programming  changes were made.  The LVAD is functioning within specified parameters.  The patient performs LVAD self-test daily.  LVAD interrogation was negative for any significant power changes, alarms or PI events/speed drops.  LVAD equipment check completed and is in good working order.  Back-up equipment present.   LVAD education done on emergency procedures and precautions and reviewed exit site care.  Length of Stay: 37  CLEGG,AMY  MD  09/24/2014, 12:34 PM  VAD Team --- VAD ISSUES ONLY--- Pager [phone] (7am - 7am)  Advanced Heart Failure Team  Pager [phone] (M-F; 7a - 4p)  Please contact [facility] for night-coverage after hours (4p -7a ) and weekends on [email or web address]   Patient seen and examined with Darrick Grinder, NP. We discussed all aspects of the encounter. I agree with the assessment and plan as stated above.   Doing very well POD#3. Main issue has been CO2 retention from restrictive lung physiology insetting of ankylosing spondylitis. Improving slowly. Agree with daily lasix. Continue IV amiodarone. Can stop digoxin. Wean milrinone to 0.25  Coumadin has been restarted. Will discuss Plavix with Dr. [name].   VAD interrogated personally.   The patient is critically ill with multiple organ systems failure and requires high complexity decision making for assessment and support, frequent evaluation and titration of therapies, application of advanced monitoring technologies and extensive interpretation of multiple databases.   Critical Care Time devoted to patient care services described in this note is 35 Minutes.  Andranik Bensimhon,MD 12:44 PM

## 2014-09-24 NOTE — Progress Notes (Addendum)
PARENTERAL NUTRITION CONSULT NOTE - INITIAL  Pharmacy Consult:  TPN Indication:  Ileus  No Known Allergies  Patient Measurements: Height: 5\' 8"  (172.7 cm) Weight: 192 lb 7.4 oz (87.3 kg) IBW/kg (Calculated) : 68.4  Vital Signs: Temp: 97 F (36.1 C) (03/25 0803) Temp Source: Axillary (03/25 0803) Pulse Rate: 83 (03/25 0800) Intake/Output from previous day: 03/24 0701 - 03/25 0700 In: 3021.6 [P.O.:125; I.V.:2346.6; IV Piggyback:550] Out: 0093 [GHWEX:9371; Chest Tube:290]  Labs:  Recent Labs  09/21/14 1647  09/22/14 0300  09/22/14 1710  09/23/14 0400 09/23/14 1642 09/24/14 0445  WBC  --   < > 13.3*  --  14.9*  --  15.8*  --  14.8*  HGB  --   < > 7.8*  < > 9.0*  < > 8.8* 9.9* 8.0*  HCT  --   < > 24.2*  < > 29.1*  < > 28.5* 29.0* 25.7*  PLT  --   < > 176  --  176  --  181  --  183  APTT 40*  --   --   --   --   --   --   --   --   INR 1.42  --  1.42  --   --   --  1.44  --  1.60*  < > = values in this interval not displayed.   Recent Labs  09/22/14 0300  09/22/14 1710  09/23/14 0400 09/23/14 1642 09/24/14 0445  NA 137  < >  --   < > 136 134* 134*  K 4.0  < >  --   < > 4.3 3.8 4.1  CL 98  < >  --   < > 98 96 96  CO2 31  --   --   --  29  --  32  GLUCOSE 115*  < >  --   < > 136* 112* 64*  BUN 13  < >  --   < > 18 26* 30*  CREATININE 0.91  < > 1.04  < > 1.03 1.30 1.13  CALCIUM 8.8  --   --   --  8.4  --  8.4  MG 2.5  --  2.2  --  2.6*  --  2.2  PHOS 3.6  --   --   --  4.4  --  2.5  PROT 5.9*  --   --   --  6.3  --  5.7*  ALBUMIN 3.2*  --   --   --  3.0*  --  2.5*  AST 149*  --   --   --  246*  --  170*  ALT 94*  --   --   --  168*  --  162*  ALKPHOS 107  --   --   --  124*  --  123*  BILITOT 3.0*  --   --   --  3.4*  --  2.2*  < > = values in this interval not displayed. Estimated Creatinine Clearance: 69.1 mL/min (by C-G formula based on Cr of 1.13).    Recent Labs  09/23/14 1943 09/23/14 2011 09/23/14 2349  GLUCAP 273* 137* 59    Medical  History: Past Medical History  Diagnosis Date  . Hyperlipidemia   . Hypertension   . Diabetes   . Nephrolithiasis   . Ankylosing spondylitis   . CAD (coronary artery disease)     a. stenting of LCx 2013; b. STEMI 06/12/14  s/p PCI to LAD complicated by post cath shock requiring IABP; VT s/p DCCV, EF 20%; c. NSTEMI 06/26/14 treated medically.  . Ischemic cardiomyopathy     a. echo 08/23/2014 EF <20%, dilated CM, mod MR/TR  . Acute on chronic respiratory failure     a. 08/2014 in setting of PE.  . Bilateral pulmonary embolism     a. 08/2014 - started on Coumadin. Retrievable IVC filter placed 08/27/14 due to RV strain and large clot burden.  . Right leg DVT     a. 08/2014.  Marland Kitchen Chronic systolic CHF (congestive heart failure)   . Hypotension   . Hemoptysis     a. 08/2014 possibly due to pulm infarct/PE.  Marland Kitchen Pleural effusion on right 08/2014 - small  . Leukocytosis   . Carotid artery disease     a. s/p stenting.  . Ventricular tachycardia     a. 06/2014 at time of MI, s/p DCCV.  Marland Kitchen Reactive thrombocytosis 09/10/2014  . Leukocytosis 09/10/2014      Insulin Requirements in the past 24 hours:  2 units SSI   Assessment: 73 YOM with history of asystole on LifeVest, CAD, and HF presented on 09/10/14 with fatigue, dyspnea, and LE edema.  He is s/p LVAD on 09/21/14 and was doing well, leading to extubation on 09/22/14.  Discussed with MD the indication for TPN and was informed patient has an ileus.      GI: 11.8% weight loss in 3 months, minimal to no PO intake since admission (15 days) but GI tract intact - bisacodyl, docusate, Reglan, PPI IV, baseline prealbumin 12.5 on 3/16 Endo: DM2 - hypoglycemic 3/22 and 3/24, CBG range 64-273 Lytes: low Na, others WNL (KCL x2 runs ordered) Renal: SCr 1.13 (rnage 0.88-1.3), CrCL 69 ml/min - good UOP 0.7 m/kg/hr Pulm: extubated 3/23 post LVAD, currently on BiPAP - CT drainage decreasing - Xopenex Cards: CAD with stenting / asystole on LifeVest / HF (EF 20%) -  Afib post-op.  VSS, pBNP 1237, CVP 10 - ASA, digoxin, Lasix, Crestor, amiodarone + diltiazem gtts, dopamine and milrinone infusion, need to resume Plavix AC: Coumadin for recent hx B/L PE and DVT in Feb 2016, s/p IVC filter placement on 2/26 - INR 1.6, hgb down to 8, plts WNL Hepatobil: LFTs elevated, tbili elevated at 2.2, TG WNL on 3/16 Neuro: scheduled APAP - GCS 15 ID: Vanc/Fortaz for PNA - afebrile, WBC trending down Best Practices: MC TPN Access: PICC placed 3/23 TPN day#: 0 (3/25 >> )  Current Nutrition:  NPO  Nutritional Goals:  1950-2150 kCal, 105-125 grams of protein per day Anticipate goal: Clinimix E 5/15 at 100 ml/hr + IVFE at 7 ml/hr = 2040 kCal and 120gm protein per day   Plan:  - Clinimix E 5/15 at 30 ml/hr + IVFE at 10 ml/hr - Daily multivitamin and trace elements in TPN - Continue CVTS SSI - NaPhos 10 mmol IV x 1 - Standard TPN labs and orders - Highly recommend a trial of enteral nutrition   Prezley Qadir D. Mina Marble, PharmD, BCPS Pager:  [phone] 09/24/2014, 10:12 AM

## 2014-09-24 NOTE — Progress Notes (Signed)
Nitric turned from 1 to 0. [place] increased from 2 L to 5 L per protocol. RN aware. Vitals stable.

## 2014-09-24 NOTE — Progress Notes (Signed)
Physical Therapy Treatment Patient Details Name: HOLTEN SPANO MRN: [id number] DOB: 1945/11/28 Today's Date: 09/24/2014    History of Present Illness 69 y.o. M with extensive cardiac hx as well as recent PE / DVT, admitted for SOB due to heart failure, cardiogenic, shock, PE. Thought to be good candidate for VAD (to be evaluated by VAD team 3/17). Found to have mod - large R pleural effusion. LVAD implantation 3/23    PT Comments    Pt moving well with ability to tolerate ambulation today despite multiple lines, tubes, and bipap. Pt with positive outlook and eager to progress mobility. Pt with BM during session with total assist for pericare. Wife present throughout and pt able to transfer power sources with mod cueing and min assist with wearing of vest for ambulation. Pt encouraged to perform HEP and continue progressing mobility with nursing as able. Will continue to follow.   Follow Up Recommendations  Home health PT;Supervision/Assistance - 24 hour     Equipment Recommendations       Recommendations for Other Services       Precautions / Restrictions Precautions Precautions: Sternal;Fall Precaution Comments: LVAD, 3 Chest tubes, pacer Restrictions Weight Bearing Restrictions: No    Mobility  Bed Mobility               General bed mobility comments: in chair on arrival  Transfers Overall transfer level: Needs assistance     Sit to Stand: Min assist Stand pivot transfers: Min assist       General transfer comment: cues for hand placement, sequence and pivot chair<>BSC with HHA. Sit to stand x3  Ambulation/Gait Ambulation/Gait assistance: Min assist;+2 safety/equipment Ambulation Distance (Feet): 63 Feet Assistive device: None (pt pushing WC) Gait Pattern/deviations: Step-through pattern;Decreased stride length;Trunk flexed   Gait velocity interpretation: Below normal speed for age/gender General Gait Details: pt with assist of 2 RT, RN, PT, and  tech to manage all equipment, pt pushing WC on bipap and nitric. Cues for posture, looking up and awareness of fatigue with 100% O2 breaths x 4 during gait. HR remained stable and no PI events. Pt rolled back to room   Stairs            Wheelchair Mobility    Modified Rankin (Stroke Patients Only)       Balance Overall balance assessment: Needs assistance   Sitting balance-Leahy Scale: Good       Standing balance-Leahy Scale: Poor                      Cognition Arousal/Alertness: Awake/alert Behavior During Therapy: Flat affect Overall Cognitive Status: Within Functional Limits for tasks assessed                      Exercises      General Comments        Pertinent Vitals/Pain Pain Assessment: No/denies pain    Home Living                      Prior Function            PT Goals (current goals can now be found in the care plan section) Progress towards PT goals: Progressing toward goals    Frequency       PT Plan Current plan remains appropriate    Co-evaluation             End of Session Equipment Utilized During Treatment: Oxygen Activity  Tolerance: Patient tolerated treatment well Patient left: in chair;with call bell/phone within reach;with nursing/sitter in room;with family/visitor present     Time: 0832-0922 PT Time Calculation (min) (ACUTE ONLY): 50 min  Charges:  $Gait Training: 38-52 mins $Therapeutic Activity: 23-37 mins                    G Codes:      Melford Aase 2014/10/06, 9:33 AM Elwyn Reach, [place]

## 2014-09-24 NOTE — Progress Notes (Signed)
MEDICATION RELATED CONSULT NOTE - FOLLOW UP  Pharmacy Consult for Vancomycin & Ceftazidime, Coumadin per MD Indication: Hx of VTE /  R/O PNA  No Known Allergies  Patient Measurements: Height: 5\' 8"  (172.7 cm) Weight: 192 lb 7.4 oz (87.3 kg) IBW/kg (Calculated) : 68.4  Vital Signs: Temp: 97 F (36.1 C) (03/25 0803) Temp Source: Axillary (03/25 0803) Pulse Rate: 77 (03/25 1000) Intake/Output from previous day: 03/24 0701 - 03/25 0700 In: 3021.6 [P.O.:125; I.V.:2346.6; IV Piggyback:550] Out: 9924 [QASTM:1962; Chest Tube:290] Intake/Output from this shift: Total I/O In: 453.7 [I.V.:203.7; IV Piggyback:250] Out: 100 [Urine:90; Chest Tube:10]  Labs:  Recent Labs  09/21/14 1647  09/22/14 0300  09/22/14 1710  09/23/14 0400 09/23/14 1642 09/24/14 0445  WBC  --   < > 13.3*  --  14.9*  --  15.8*  --  14.8*  HGB  --   < > 7.8*  < > 9.0*  < > 8.8* 9.9* 8.0*  HCT  --   < > 24.2*  < > 29.1*  < > 28.5* 29.0* 25.7*  PLT  --   < > 176  --  176  --  181  --  183  APTT 40*  --   --   --   --   --   --   --   --   CREATININE  --   < > 0.91  < > 1.04  < > 1.03 1.30 1.13  MG  --   < > 2.5  --  2.2  --  2.6*  --  2.2  PHOS  --   --  3.6  --   --   --  4.4  --  2.5  ALBUMIN  --   --  3.2*  --   --   --  3.0*  --  2.5*  PROT  --   --  5.9*  --   --   --  6.3  --  5.7*  AST  --   --  149*  --   --   --  246*  --  170*  ALT  --   --  94*  --   --   --  168*  --  162*  ALKPHOS  --   --  107  --   --   --  124*  --  123*  BILITOT  --   --  3.0*  --   --   --  3.4*  --  2.2*  < > = values in this interval not displayed. Estimated Creatinine Clearance: 69.1 mL/min (by C-G formula based on Cr of 1.13).   Microbiology: Recent Results (from the past 720 hour(s))  MRSA PCR Screening     Status: None   Collection Time: 08/27/14  5:39 AM  Result Value Ref Range Status   MRSA by PCR NEGATIVE NEGATIVE Final    Comment:        The GeneXpert MRSA Assay (FDA approved for NASAL specimens only), is  one component of a comprehensive MRSA colonization surveillance program. It is not intended to diagnose MRSA infection nor to guide or monitor treatment for MRSA infections.   Clostridium Difficile by PCR     Status: None   Collection Time: 08/30/14 10:35 AM  Result Value Ref Range Status   C difficile by pcr NEGATIVE NEGATIVE Final  Body fluid culture     Status: None   Collection Time: 09/16/14  3:45 PM  Result Value Ref Range Status   Specimen Description FLUID RIGHT PLEURAL  Final   Special Requests NONE  Final   Gram Stain   Final    RARE WBC PRESENT,BOTH PMN AND MONONUCLEAR NO ORGANISMS SEEN Performed at [facility]    Culture   Final    NO GROWTH 3 DAYS Performed at [facility]    Report Status 09/20/2014 FINAL  Final  Surgical pcr screen     Status: None   Collection Time: 09/17/14 11:24 AM  Result Value Ref Range Status   MRSA, PCR NEGATIVE NEGATIVE Final   Staphylococcus aureus NEGATIVE NEGATIVE Final    Comment:        The Xpert SA Assay (FDA approved for NASAL specimens in patients over 90 years of age), is one component of a comprehensive surveillance program.  Test performance has been validated by [facility] for patients greater than or equal to 53 year old. It is not intended to diagnose infection nor to guide or monitor treatment.   Tissue culture     Status: None   Collection Time: 09/21/14 11:31 AM  Result Value Ref Range Status   Specimen Description TISSUE PERICARDIUM  Final   Special Requests NONE  Final   Gram Stain   Final    NO WBC SEEN NO ORGANISMS SEEN Performed at [facility]    Culture   Final    NO GROWTH 2 DAYS Performed at [facility]    Report Status 09/23/2014 FINAL  Final  Body fluid culture     Status: None (Preliminary result)   Collection Time: 09/21/14  1:13 PM  Result Value Ref Range Status   Specimen Description PLEURAL  Final   Special Requests NONE  Final   Gram Stain    Final    FEW WBC PRESENT,BOTH PMN AND MONONUCLEAR NO ORGANISMS SEEN Performed at [facility]    Culture   Final    NO GROWTH 1 DAY Performed at [facility]    Report Status PENDING  Incomplete  AFB culture with smear     Status: None (Preliminary result)   Collection Time: 09/21/14  1:13 PM  Result Value Ref Range Status   Specimen Description CSF  Final   Special Requests NONE  Final   Acid Fast Smear   Final    NO ACID FAST BACILLI SEEN Performed at [facility]    Culture   Final    CULTURE WILL BE EXAMINED FOR 6 WEEKS BEFORE ISSUING A FINAL REPORT Performed at [facility]    Report Status PENDING  Incomplete  Culture, respiratory (NON-Expectorated)     Status: None   Collection Time: 09/21/14  5:01 PM  Result Value Ref Range Status   Specimen Description TRACHEAL ASPIRATE  Final   Special Requests Normal  Final   Gram Stain   Final    FEW WBC PRESENT,BOTH PMN AND MONONUCLEAR NO SQUAMOUS EPITHELIAL CELLS SEEN NO ORGANISMS SEEN Performed at [facility]    Culture   Final    NO GROWTH 2 DAYS Performed at [facility]    Report Status 09/24/2014 FINAL  Final    Assessment: 69yo male on warf PTA recent bilat PE and RLE DVT, s/p IVC filter (08/2014), admitted 3/11 with low output HF  PMH: CAD s/p LAD stent in 2013, DM, carotid stenting, ankylosing spondylitis, bilateral PE(08/2014), IVC filter  Coag/heme: Hx PE, IVC filter, Warfarin/plavix stopped 3/17 (  P2y12 70 on 3/18, now 175) S/p LVAD 2u PRBC, 3 FFP Per PVT note restart warf 3/23 pm, asa 325 started,  - f/u restart clop INR 1.6 3/25 - continue daily dosing per cvts - no dose entered as of this morning  PTA Warfarin 2mg  Mon and Fri, 4mg  all other days  ID was treated with ceftaz/vanc preop for pna x5 days, increasing wbc 14.8, restarted fortaz/vanc. Current dosing remains appropriate. Stop date in place for 3/26 - if to continue will check vancomycin level.    3/17 Fluid cx > No growth 3/22 pleural - ngtd 3/22 tissue - ngF 3/22 TA - ngtd   3/17 >> vanc, 3/21 VT 14.7 mcg/ml>>>(3/26) 3/17 >> ceftaz >> 3/22, 3/24>> 3/22 >> Zinacef >> [x 48 hrs] 3/22 rifampin x2 complete  CV: recent admission 06/2014 STEMI DES to LAD, 3/18 RHC CI 1.7. IABP 3/18> LVAD ECHO 3/12: LVEF 15%, RV mild-mod dilated; Also had brief 2 min of asystole AM 3/11 (noted on lifevest) Admit wt 200lbs;  Continue lasix IV(off gtt), off dopa/epi, coox 65, cvp 8  Nephrology: SCr 1.1, CrCl ~60ml/min. K 4.1  Pulmonary: intubated post OR- extubate 3/23, continues on bipap NO weaned off 3/23 am  Best Practices: ppi, war   Plan:  Resumed vancomycin 1g IV Q12 x 48 hr (stop time 3/26) - check trough if reordered Fortaz 1g q8h Continue warfarin per PVT F/up restart clopidogrel  Erin Hearing PharmD., BCPS Clinical Pharmacist Pager [phone] 09/24/2014 10:54 AM

## 2014-09-25 ENCOUNTER — Inpatient Hospital Stay (HOSPITAL_COMMUNITY): Payer: Medicare Other

## 2014-09-25 DIAGNOSIS — I480 Paroxysmal atrial fibrillation: Secondary | ICD-10-CM | POA: Insufficient documentation

## 2014-09-25 DIAGNOSIS — Z95811 Presence of heart assist device: Secondary | ICD-10-CM | POA: Insufficient documentation

## 2014-09-25 LAB — POCT I-STAT, CHEM 8
BUN: 37 mg/dL — ABNORMAL HIGH (ref 6–23)
CREATININE: 1.6 mg/dL — AB (ref 0.50–1.35)
Calcium, Ion: 1.11 mmol/L — ABNORMAL LOW (ref 1.13–1.30)
Chloride: 95 mmol/L — ABNORMAL LOW (ref 96–112)
Glucose, Bld: 112 mg/dL — ABNORMAL HIGH (ref 70–99)
HCT: 28 % — ABNORMAL LOW (ref 39.0–52.0)
HEMOGLOBIN: 9.5 g/dL — AB (ref 13.0–17.0)
Potassium: 4 mmol/L (ref 3.5–5.1)
Sodium: 128 mmol/L — ABNORMAL LOW (ref 135–145)
TCO2: 31 mmol/L (ref 0–100)

## 2014-09-25 LAB — COMPREHENSIVE METABOLIC PANEL
ALT: 135 U/L — ABNORMAL HIGH (ref 0–53)
AST: 101 U/L — ABNORMAL HIGH (ref 0–37)
Albumin: 2.4 g/dL — ABNORMAL LOW (ref 3.5–5.2)
Alkaline Phosphatase: 136 U/L — ABNORMAL HIGH (ref 39–117)
Anion gap: 9 (ref 5–15)
BUN: 41 mg/dL — ABNORMAL HIGH (ref 6–23)
CO2: 32 mmol/L (ref 19–32)
Calcium: 8.5 mg/dL (ref 8.4–10.5)
Chloride: 92 mmol/L — ABNORMAL LOW (ref 96–112)
Creatinine, Ser: 1.22 mg/dL (ref 0.50–1.35)
GFR calc Af Amer: 70 mL/min — ABNORMAL LOW (ref >90)
GFR calc non Af Amer: 60 mL/min — ABNORMAL LOW (ref >90)
Glucose, Bld: 115 mg/dL — ABNORMAL HIGH (ref 70–99)
Potassium: 4 mmol/L (ref 3.5–5.1)
Sodium: 133 mmol/L — ABNORMAL LOW (ref 135–145)
Total Bilirubin: 1.6 mg/dL — ABNORMAL HIGH (ref 0.3–1.2)
Total Protein: 5.2 g/dL — ABNORMAL LOW (ref 6.0–8.3)

## 2014-09-25 LAB — BODY FLUID CULTURE: Culture: NO GROWTH

## 2014-09-25 LAB — CBC WITH DIFFERENTIAL/PLATELET
Basophils Absolute: 0 10*3/uL (ref 0.0–0.1)
Basophils Relative: 0 % (ref 0–1)
Eosinophils Absolute: 0.1 10*3/uL (ref 0.0–0.7)
Eosinophils Relative: 1 % (ref 0–5)
HCT: 25.4 % — ABNORMAL LOW (ref 39.0–52.0)
Hemoglobin: 7.9 g/dL — ABNORMAL LOW (ref 13.0–17.0)
Lymphocytes Relative: 6 % — ABNORMAL LOW (ref 12–46)
Lymphs Abs: 0.8 10*3/uL (ref 0.7–4.0)
MCH: 27.6 pg (ref 26.0–34.0)
MCHC: 31.1 g/dL (ref 30.0–36.0)
MCV: 88.8 fL (ref 78.0–100.0)
Monocytes Absolute: 1.2 10*3/uL — ABNORMAL HIGH (ref 0.1–1.0)
Monocytes Relative: 9 % (ref 3–12)
Neutro Abs: 11 10*3/uL — ABNORMAL HIGH (ref 1.7–7.7)
Neutrophils Relative %: 84 % — ABNORMAL HIGH (ref 43–77)
Platelets: 224 10*3/uL (ref 150–400)
RBC: 2.86 MIL/uL — ABNORMAL LOW (ref 4.22–5.81)
RDW: 17.3 % — ABNORMAL HIGH (ref 11.5–15.5)
WBC: 13.1 10*3/uL — ABNORMAL HIGH (ref 4.0–10.5)

## 2014-09-25 LAB — CARBOXYHEMOGLOBIN
Carboxyhemoglobin: 1.2 % (ref 0.5–1.5)
Carboxyhemoglobin: 1 % (ref 0.5–1.5)
Methemoglobin: 0.9 % (ref 0.0–1.5)
Methemoglobin: 0.8 % (ref 0.0–1.5)
O2 Saturation: 60.4 %
O2 Saturation: 54.5 %
Total hemoglobin: 8.8 g/dL — ABNORMAL LOW (ref 13.5–18.0)
Total hemoglobin: 8 g/dL — ABNORMAL LOW (ref 13.5–18.0)

## 2014-09-25 LAB — GLUCOSE, CAPILLARY
GLUCOSE-CAPILLARY: 77 mg/dL (ref 70–99)
GLUCOSE-CAPILLARY: 117 mg/dL — AB (ref 70–99)
GLUCOSE-CAPILLARY: 114 mg/dL — AB (ref 70–99)
Glucose-Capillary: 117 mg/dL — ABNORMAL HIGH (ref 70–99)
Glucose-Capillary: 152 mg/dL — ABNORMAL HIGH (ref 70–99)
Glucose-Capillary: 158 mg/dL — ABNORMAL HIGH (ref 70–99)
Glucose-Capillary: 163 mg/dL — ABNORMAL HIGH (ref 70–99)
Glucose-Capillary: 69 mg/dL — ABNORMAL LOW (ref 70–99)
Glucose-Capillary: 81 mg/dL (ref 70–99)

## 2014-09-25 LAB — BLOOD GAS, ARTERIAL
Acid-Base Excess: 5.6 mmol/L — ABNORMAL HIGH (ref 0.0–2.0)
Bicarbonate: 30.8 mEq/L — ABNORMAL HIGH (ref 20.0–24.0)
O2 Content: 3 L/min
O2 Saturation: 98.6 %
Patient temperature: 98.6
TCO2: 32.5 mmol/L (ref 0–100)
pCO2 arterial: 55.3 mmHg — ABNORMAL HIGH (ref 35.0–45.0)
pH, Arterial: 7.364 (ref 7.350–7.450)
pO2, Arterial: 132 mmHg — ABNORMAL HIGH (ref 80.0–100.0)

## 2014-09-25 LAB — LACTATE DEHYDROGENASE: LDH: 341 U/L — ABNORMAL HIGH (ref 94–250)

## 2014-09-25 LAB — PROTIME-INR
INR: 1.84 — ABNORMAL HIGH (ref 0.00–1.49)
Prothrombin Time: 21.5 seconds — ABNORMAL HIGH (ref 11.6–15.2)

## 2014-09-25 LAB — TRIGLYCERIDES: Triglycerides: 64 mg/dL (ref <150)

## 2014-09-25 LAB — PHOSPHORUS: Phosphorus: 3.6 mg/dL (ref 2.3–4.6)

## 2014-09-25 LAB — MAGNESIUM: Magnesium: 2.2 mg/dL (ref 1.5–2.5)

## 2014-09-25 MED ORDER — TRACE MINERALS CR-CU-F-FE-I-MN-MO-SE-ZN IV SOLN
INTRAVENOUS | Status: AC
  Administered 2014-09-25 (×2): via INTRAVENOUS
  Filled 2014-09-25: qty 1920

## 2014-09-25 MED ORDER — FUROSEMIDE 10 MG/ML IJ SOLN
40.0000 mg | Freq: Two times a day (BID) | INTRAMUSCULAR | Status: DC
  Administered 2014-09-25 – 2014-09-27 (×5): 40 mg via INTRAVENOUS
  Filled 2014-09-25 (×6): qty 4

## 2014-09-25 MED ORDER — AMIODARONE HCL 200 MG PO TABS
400.0000 mg | ORAL_TABLET | Freq: Two times a day (BID) | ORAL | Status: DC
  Administered 2014-09-25 – 2014-09-26 (×4): 400 mg via ORAL
  Filled 2014-09-25 (×6): qty 2

## 2014-09-25 MED ORDER — FLUCONAZOLE 100MG IVPB
100.0000 mg | Freq: Once | INTRAVENOUS | Status: AC
  Administered 2014-09-25: 100 mg via INTRAVENOUS
  Filled 2014-09-25: qty 50

## 2014-09-25 MED ORDER — ALPRAZOLAM 0.5 MG PO TABS
1.0000 mg | ORAL_TABLET | Freq: Every day | ORAL | Status: DC
  Administered 2014-09-25: 1 mg via ORAL
  Filled 2014-09-25: qty 2

## 2014-09-25 MED ORDER — FE FUMARATE-B12-VIT C-FA-IFC PO CAPS
1.0000 | ORAL_CAPSULE | Freq: Three times a day (TID) | ORAL | Status: DC
  Administered 2014-09-25 – 2014-10-04 (×29): 1 via ORAL
  Filled 2014-09-25 (×34): qty 1

## 2014-09-25 MED ORDER — FAT EMULSION 20 % IV EMUL
240.0000 mL | INTRAVENOUS | Status: AC
  Administered 2014-09-25: 240 mL via INTRAVENOUS
  Filled 2014-09-25: qty 250

## 2014-09-25 MED ORDER — FUROSEMIDE 10 MG/ML IJ SOLN
40.0000 mg | Freq: Once | INTRAMUSCULAR | Status: AC
  Administered 2014-09-25: 40 mg via INTRAVENOUS
  Filled 2014-09-25: qty 4

## 2014-09-25 MED ORDER — CETYLPYRIDINIUM CHLORIDE 0.05 % MT LIQD
7.0000 mL | Freq: Two times a day (BID) | OROMUCOSAL | Status: DC
  Administered 2014-09-25 – 2014-09-27 (×5): 7 mL via OROMUCOSAL

## 2014-09-25 MED ORDER — AMIODARONE LOAD VIA INFUSION
150.0000 mg | Freq: Once | INTRAVENOUS | Status: AC
  Administered 2014-09-25: 150 mg via INTRAVENOUS

## 2014-09-25 NOTE — Progress Notes (Signed)
HeartMate 2 Rounding Note  Subjective:   POD #4  VAD implant  (DT)  For acute /chronic systolic HF from ischemic cardiomyopathy and cardiogenic shock on preop IABP  Recent PE- with large R pulmonary infarct,necrotic    lung/pneumonia- poss empyema Restrictive lung disease- FEV1 1.3 from ankylosing spondylitis DM  HX ventricular, atrial arrhythmia Postoperative CO2 retention requiring BiPAP  Patient continues to progress with improved pulmonary status. He tolerated being off BiPAP all night with satisfactory PCO2 this morning. Nitric oxide weaned off. Hemodynamics stable. Co-ox this a.m. greater than 60% and milrinone stopped.  Pump parameters are satisfactory. PI is elevated from fluid shift and diuretics have been increased to Lasix twice a day and pump speed up to 9200 RPM  Minimal chest tube drainage except for pocket drain-we'll remove left pleural tube today  On T NA  For severe protein malnutrition with albumin 2.4 and postoperative ileus. Continued T NA another 24 hours as his po diet is advanced.  Patient had episode of atrial fibrillation treated with amiodarone bolus last p.m. INR continues to slowly increase now 1.8.   HeartMate II LVAD:  Flow 5.1 liters/min, speed 9200 rpm power 4.1, PI  7.0  Controller intact  Objective:    Vital Signs:   Temp:  [96.3 F (35.7 C)-98.1 F (36.7 C)] 96.3 F (35.7 C) (03/26 0805) Pulse Rate:  [31-132] 73 (03/26 1000) Resp:  [11-41] 35 (03/26 1000) SpO2:  [93 %-100 %] 100 % (03/26 1000) Weight:  [195 lb 15.8 oz (88.9 kg)] 195 lb 15.8 oz (88.9 kg) (03/26 0500) Last BM Date: 09/24/14 Mean arterial Pressure 75  Intake/Output:   Intake/Output Summary (Last 24 hours) at 09/25/14 1032 Last data filed at 09/25/14 1000  Gross per 24 hour  Intake 2647.49 ml  Output   2260 ml  Net 387.49 ml     Physical Exam: General:  Alert on nasal cannula HEENT: normal Neck: supple. JVP . Carotids 2+ bilat; no bruits. No lymphadenopathy or  thryomegaly appreciated. Cor: Mechanical heart sounds with LVAD hum present. Lungs: coarse bs on R, moving better air today Abdomen: soft, nontender, nondistended. No hepatosplenomegaly. No bruits or masses. Good bowel sounds. Driveline: C/D/I; securement device intact and driveline incorporated Extremities: warm well perfused,no cyanosis, clubbing, rash, edema- IABP out without hematoma and groin Neuro: moves all extremities   Telemetry:nsr  Labs: Basic Metabolic Panel:  Recent Labs Lab 09/21/14 2200 09/22/14 0300  09/22/14 1710 09/23/14 0053 09/23/14 0400 09/23/14 1642 09/24/14 0445 09/25/14 0410  NA 136 137  < >  --  135 136 134* 134* 133*  K 3.9 4.0  < >  --  4.9 4.3 3.8 4.1 4.0  CL 97 98  < >  --  96 98 96 96 92*  CO2 28 31  --   --   --  29  --  32 32  GLUCOSE 165* 115*  < >  --  147* 136* 112* 64* 115*  BUN 16 13  < >  --  28* 18 26* 30* 41*  CREATININE 1.06 0.91  < > 1.04 1.20 1.03 1.30 1.13 1.22  CALCIUM 9.2 8.8  --   --   --  8.4  --  8.4 8.5  MG 2.7* 2.5  --  2.2  --  2.6*  --  2.2 2.2  PHOS  --  3.6  --   --   --  4.4  --  2.5 3.6  < > = values in this  interval not displayed.  Liver Function Tests:  Recent Labs Lab 09/22/14 0300 09/23/14 0400 09/24/14 0445 09/25/14 0410  AST 149* 246* 170* 101*  ALT 94* 168* 162* 135*  ALKPHOS 107 124* 123* 136*  BILITOT 3.0* 3.4* 2.2* 1.6*  PROT 5.9* 6.3 5.7* 5.2*  ALBUMIN 3.2* 3.0* 2.5* 2.4*   No results for input(s): LIPASE, AMYLASE in the last 168 hours. No results for input(s): AMMONIA in the last 168 hours.  CBC:  Recent Labs Lab 09/22/14 0300  09/22/14 1710 09/23/14 0053 09/23/14 0400 09/23/14 1642 09/24/14 0445 09/25/14 0410  WBC 13.3*  --  14.9*  --  15.8*  --  14.8* 13.1*  NEUTROABS 10.9*  --   --   --  13.9*  --  12.8* 11.0*  HGB 7.8*  < > 9.0* 10.5* 8.8* 9.9* 8.0* 7.9*  HCT 24.2*  < > 29.1* 31.0* 28.5* 29.0* 25.7* 25.4*  MCV 86.7  --  89.5  --  90.5  --  88.6 88.8  PLT 176  --  176  --  181   --  183 224  < > = values in this interval not displayed.  INR:  Recent Labs Lab 09/21/14 1647 09/22/14 0300 09/23/14 0400 09/24/14 0445 09/25/14 0410  INR 1.42 1.42 1.44 1.60* 1.84*    Other results:  EKG:   Imaging: Dg Chest Port 1 View  09/24/2014   CLINICAL DATA:  Left ventricular assist device  EXAM: PORTABLE CHEST - 1 VIEW  COMPARISON:  09/23/2014  FINDINGS: Cardiac shadow remains enlarged. An LVAD is again identified and stable. Swan-Ganz catheter has been removed in the interval as has the mediastinal drain. Thoracostomy catheters remain bilaterally as does a right-sided PICC line and right jugular sheath. Rounded area of density is noted laterally on the right stable from the prior exam and possibly representing some fluid within the fissures. No pneumothorax is seen. No focal infiltrate is noted.  IMPRESSION: Removal of the mediastinal drain when compared with the prior exam.  Stable density in the lateral right lung base. No new focal abnormality is seen.   Electronically Signed   By: Inez Catalina M.D.   On: 09/24/2014 08:14     Medications:     Scheduled Medications: . acetaminophen  1,000 mg Oral 4 times per day   Or  . acetaminophen (TYLENOL) oral liquid 160 mg/5 mL  1,000 mg Per Tube 4 times per day  . amiodarone  400 mg Oral BID  . antiseptic oral rinse  7 mL Mouth Rinse BID  . aspirin EC  325 mg Oral Daily   Or  . aspirin  324 mg Per Tube Daily  . bisacodyl  10 mg Oral Daily   Or  . bisacodyl  10 mg Rectal Daily  . cefTAZidime (FORTAZ)  IV  1 g Intravenous 3 times per day  . docusate sodium  200 mg Oral Daily  . ferrous [id number] C-folic acid  1 capsule Oral TID PC  . furosemide  40 mg Intravenous BID  . insulin aspart  0-24 Units Subcutaneous 6 times per day  . levalbuterol  1.25 mg Nebulization Q6H  . metoCLOPramide (REGLAN) injection  10 mg Intravenous 4 times per day  . pantoprazole (PROTONIX) IV  40 mg Intravenous Daily  . rosuvastatin   10 mg Oral q1800  . sodium chloride  10-40 mL Intracatheter Q12H  . sodium chloride  10-40 mL Intracatheter Q12H  . sodium chloride  3 mL Intravenous Q12H  .  warfarin  5 mg Oral q1800  . Warfarin - Physician Dosing Inpatient   Does not apply q1800    Infusions: . Marland KitchenTPN (CLINIMIX-E) Adult     And  . fat emulsion    . sodium chloride Stopped (09/24/14 1900)  . sodium chloride    . sodium chloride 20 mL/hr at 09/23/14 1900  . amiodarone 30 mg/hr (09/25/14 1000)  . Marland KitchenTPN (CLINIMIX-E) Adult 30 mL/hr at 09/25/14 1000   And  . fat emulsion 240 mL (09/25/14 1000)  . lactated ringers 20 mL/hr at 09/25/14 1000  . lactated ringers    . milrinone Stopped (09/25/14 0430)    PRN Medications: sodium chloride, ketorolac, ondansetron (ZOFRAN) IV, sodium chloride, sodium chloride, sodium chloride, traMADol   Assessment:  Stable hemodynamics- no PI events C02 improved off BiPAP 24 hours Fluid shift with increased vascular volume reflected by increased pulsatility index-will increase Lasix Continue to remove chest tubes leaving only pocket drain  Plan/Discussion:   DC Cordis sheath, DC A-line DC left  pleural tube Ambulate with physical therapy INR slowly rising, 1.8 on Coumadin 5 mg daily Continue TPN for another 24 hours then stop  I reviewed the LVAD parameters from today, and compared the results to the patient's prior recorded data.  No programming changes were made.  The LVAD is functioning within specified parameters.  The patient performs LVAD self-test daily.  LVAD interrogation was negative for any significant power changes, alarms or PI events/speed drops.  LVAD equipment check completed and is in good working order.  Back-up equipment present.   LVAD education done on emergency procedures and precautions and reviewed exit site care.  Length of Stay: Bryan III 09/25/2014, 10:32 AM  VAD Team --- VAD ISSUES ONLY--- Pager [phone] (7am - 7am)  Advanced Heart Failure  Team  Pager [phone] (M-F; 7a - 4p)  Please contact [facility] for night-coverage after hours (4p -7a ) and weekends on [email or web address]

## 2014-09-25 NOTE — Progress Notes (Signed)
HeartMate 2 Rounding Note  Subjective:    Post Day 4 - HMII LVAD   Off BiPap this am. PCO2 much improved. Milrinone turned down yesterday. Co-ox  Pending. Had recurrent AF over night and amio rebolused. Now back in NSR. Feeling better. Had small BM.   LVAD INTERROGATION:  HeartMate II LVAD:  Flow 4.8 liters/min, speed 9000 power 5.1, PI 7.2 No PI events over night.     Objective:    Vital Signs:   Temp:  [96.9 F (36.1 C)-98.1 F (36.7 C)] 97.9 F (36.6 C) (03/25 2000) Pulse Rate:  [31-132] 126 (03/26 0200) Resp:  [16-41] 19 (03/26 0200) SpO2:  [94 %-100 %] 99 % (03/26 0200) FiO2 (%):  [30 %] 30 % (03/25 0915) Weight:  [87.3 kg (192 lb 7.4 oz)] 87.3 kg (192 lb 7.4 oz) (03/25 0615) Last BM Date: 09/24/14 Mean arterial Pressure  80-84  Intake/Output:   Intake/Output Summary (Last 24 hours) at 09/25/14 0231 Last data filed at 09/25/14 0210  Gross per 24 hour  Intake 2663.34 ml  Output   1260 ml  Net 1403.34 ml     Physical Exam: General:  awake, HEENT: normal Neck: supple. RIJ cordis Carotids 2+ bilat; no bruits. No lymphadenopathy or thryomegaly appreciated.  Cor: Mechanical heart sounds with LVAD hum present. Sternal dressing intact.   Lungs: clear Abdomen: soft, nontender, nondistended. No hepatosplenomegaly. No bruits or masses. Good bowel sounds. Driveline: Dressing with minimal bloody exudate noted.  Extremities: no cyanosis, clubbing, rash, tr edema R and LLE SCDs  RUE A-line  Neuro: Awake follows commands GU: Foley   Telemetry: NSR 80s   Labs: Basic Metabolic Panel:  Recent Labs Lab 09/21/14 0740  09/21/14 2200 09/22/14 0300  09/22/14 1658 09/22/14 1710 09/23/14 0053 09/23/14 0400 09/23/14 1642 09/24/14 0445  NA 131*  < > 136 137  < > 135  --  135 136 134* 134*  K 5.6*  < > 3.9 4.0  < > 4.2  --  4.9 4.3 3.8 4.1  CL 85*  < > 97 98  < > 99  --  96 98 96 96  CO2 38*  --  28 31  --   --   --   --  29  --  32  GLUCOSE 312*  < > 165* 115*  < > 111*   --  147* 136* 112* 64*  BUN 19  < > 16 13  < > 17  --  28* 18 26* 30*  CREATININE 1.15  < > 1.06 0.91  < > 1.10 1.04 1.20 1.03 1.30 1.13  CALCIUM 8.3*  --  9.2 8.8  --   --   --   --  8.4  --  8.4  MG  --   --  2.7* 2.5  --   --  2.2  --  2.6*  --  2.2  PHOS  --   --   --  3.6  --   --   --   --  4.4  --  2.5  < > = values in this interval not displayed.  Liver Function Tests:  Recent Labs Lab 09/22/14 0300 09/23/14 0400 09/24/14 0445  AST 149* 246* 170*  ALT 94* 168* 162*  ALKPHOS 107 124* 123*  BILITOT 3.0* 3.4* 2.2*  PROT 5.9* 6.3 5.7*  ALBUMIN 3.2* 3.0* 2.5*   No results for input(s): LIPASE, AMYLASE in the last 168 hours. No results for input(s): AMMONIA in the  last 168 hours.  CBC:  Recent Labs Lab 09/21/14 2200 09/22/14 0300  09/22/14 1710 09/23/14 0053 09/23/14 0400 09/23/14 1642 09/24/14 0445  WBC 16.8* 13.3*  --  14.9*  --  15.8*  --  14.8*  NEUTROABS  --  10.9*  --   --   --  13.9*  --  12.8*  HGB 8.2* 7.8*  < > 9.0* 10.5* 8.8* 9.9* 8.0*  HCT 25.7* 24.2*  < > 29.1* 31.0* 28.5* 29.0* 25.7*  MCV 87.4 86.7  --  89.5  --  90.5  --  88.6  PLT 199 176  --  176  --  181  --  183  < > = values in this interval not displayed.  INR:  Recent Labs Lab 09/21/14 0450 09/21/14 1647 09/22/14 0300 09/23/14 0400 09/24/14 0445  INR 1.43 1.42 1.42 1.44 1.60*    Other results:    Imaging: Dg Chest Port 1 View  09/24/2014   CLINICAL DATA:  Left ventricular assist device  EXAM: PORTABLE CHEST - 1 VIEW  COMPARISON:  09/23/2014  FINDINGS: Cardiac shadow remains enlarged. An LVAD is again identified and stable. Swan-Ganz catheter has been removed in the interval as has the mediastinal drain. Thoracostomy catheters remain bilaterally as does a right-sided PICC line and right jugular sheath. Rounded area of density is noted laterally on the right stable from the prior exam and possibly representing some fluid within the fissures. No pneumothorax is seen. No focal  infiltrate is noted.  IMPRESSION: Removal of the mediastinal drain when compared with the prior exam.  Stable density in the lateral right lung base. No new focal abnormality is seen.   Electronically Signed   By: Inez Catalina M.D.   On: 09/24/2014 08:14   Dg Chest Port 1 View  09/23/2014   CLINICAL DATA:  Left ventricular assist device.  EXAM: PORTABLE CHEST - 1 VIEW  COMPARISON:  09/22/2014.  FINDINGS: Luiz Blare scan catheter, mediastinal drainage catheter, right PICC line, bilateral chest tubes in stable position. Left ventricular assist device in stable position. Cardiomegaly. Interim improvement of pulmonary vascular prominence and interstitial prominence suggesting improving congestive heart failure. Small bilateral pleural effusions with fissural fluid on the right. Bibasilar atelectasis. No pneumothorax.  IMPRESSION: 1. Lines and tubes including bilateral chest tubes in stable position. Left ventricular assist device in stable position. No pneumothorax. 2. Interim partial clearing of congestive heart failure and pulmonary interstitial edema. Persistent small pleural effusions with fissural fluid on the right again noted.   Electronically Signed   By: Marcello Moores  Register   On: 09/23/2014 07:40     Medications:     Scheduled Medications: . acetaminophen  1,000 mg Oral 4 times per day   Or  . acetaminophen (TYLENOL) oral liquid 160 mg/5 mL  1,000 mg Per Tube 4 times per day  . antiseptic oral rinse  7 mL Mouth Rinse q12n4p  . aspirin EC  325 mg Oral Daily   Or  . aspirin  324 mg Per Tube Daily  . bisacodyl  10 mg Oral Daily   Or  . bisacodyl  10 mg Rectal Daily  . cefTAZidime (FORTAZ)  IV  1 g Intravenous 3 times per day  . chlorhexidine  15 mL Mouth Rinse BID  . docusate sodium  200 mg Oral Daily  . furosemide  40 mg Intravenous Daily  . insulin aspart  0-24 Units Subcutaneous 6 times per day  . levalbuterol  1.25 mg Nebulization Q6H  .  metoCLOPramide (REGLAN) injection  10 mg Intravenous 4  times per day  . pantoprazole (PROTONIX) IV  40 mg Intravenous Daily  . rosuvastatin  10 mg Oral q1800  . sodium chloride  10-40 mL Intracatheter Q12H  . sodium chloride  10-40 mL Intracatheter Q12H  . sodium chloride  3 mL Intravenous Q12H  . vancomycin  1,000 mg Intravenous Q12H  . warfarin  5 mg Oral q1800  . Warfarin - Physician Dosing Inpatient   Does not apply q1800    Infusions: . sodium chloride Stopped (09/24/14 1900)  . sodium chloride    . sodium chloride 20 mL/hr at 09/23/14 1900  . amiodarone 30 mg/hr (09/25/14 0210)  . DOPamine Stopped (09/24/14 0900)  . Marland KitchenTPN (CLINIMIX-E) Adult 30 mL/hr at 09/24/14 1800   And  . fat emulsion 240 mL (09/24/14 1800)  . lactated ringers 20 mL/hr at 09/25/14 0157  . lactated ringers    . milrinone 0.25 mcg/kg/min (09/24/14 1936)    PRN Medications: sodium chloride, fentaNYL, ketorolac, midazolam, ondansetron (ZOFRAN) IV, sodium chloride, sodium chloride, sodium chloride, traMADol   Assessment:  1. S/P HMII LVAD 09/21/14  2.  A/C systolic HF class IV with cardiogenic shock- EF 15% with moderate RV dysfunction 3/16 echo.  3.  CAD s/p Anterior STEMI on 06/12/14 with stenting of LAD- on coumadin + plavix, statin.  4.  06/12/14 VT in setting of STEMI 5.  Ankylosing spondylitis. 6. Bilateral PE with R DVT- S/P IVC filter 08/27/2014--->coumadin 7.  Asystole noted from Life Vest 8.  LV thrombus 9.  Full Code 10. Hemoptysis, resolved 11. A/C renal failure, resolved 12. Pleural effusion, transudative  13. ?PNA 14. Acute Blood Loss- Anemia- Received 1UPRBCs 3/23  Plan/Discussion:   POD #4 HMII LVAD.   Doing very well. Main issue has been CO2 retention from restrictive lung physiology insetting of ankylosing spondylitis. Much improved. pco2 now 50.   Diuresing steadily. Agree with daily lasix.  Await co-ox. if 55% or greater can stop milrinone. PI > 7.0. I turned sped up to 9200.   Back on amio for recurrent AF. Now in NSR. Will  continue IV amio.   Coumadin has been restarted. Will discuss Plavix with Dr. [name].  Foley out today  The patient is critically ill with multiple organ systems failure and requires high complexity decision making for assessment and support, frequent evaluation and titration of therapies, application of advanced monitoring technologies and extensive interpretation of multiple databases.   Critical Care Time devoted to patient care services described in this note is 35 Minutes.  I reviewed the LVAD parameters from today, and compared the results to the patient's prior recorded data.  No programming changes were made.  The LVAD is functioning within specified parameters.  The patient performs LVAD self-test daily.  LVAD interrogation was negative for any significant power changes, alarms or PI events/speed drops.  LVAD equipment check completed and is in good working order.  Back-up equipment present.   LVAD education done on emergency procedures and precautions and reviewed exit site care.  Length of Stay: 15  [name]  MD  09/25/2014, 2:31 AM  VAD Team --- VAD ISSUES ONLY--- Pager [phone] (7am - 7am)  Advanced Heart Failure Team  Pager [phone] (M-F; 7a - 4p)  Please contact [facility] for night-coverage after hours (4p -7a ) and weekends on [email or web address]

## 2014-09-25 NOTE — Progress Notes (Signed)
PARENTERAL NUTRITION CONSULT NOTE - FOLLOW UP  Pharmacy Consult:  TPN Indication:  Ileus  No Known Allergies  Patient Measurements: Height: 5\' 8"  (172.7 cm) Weight: 195 lb 15.8 oz (88.9 kg) IBW/kg (Calculated) : 68.4  Vital Signs: Temp: 97.6 F (36.4 C) (03/26 0400) Temp Source: Oral (03/26 0400) Pulse Rate: 75 (03/26 0600) Intake/Output from previous day: 03/25 0701 - 03/26 0700 In: 2671.1 [I.V.:1279.8; IV Piggyback:852; TPN:539.3] Out: 1360 [Urine:1010; Chest Tube:350]  Labs:  Recent Labs  09/23/14 0400 09/23/14 1642 09/24/14 0445 09/25/14 0410  WBC 15.8*  --  14.8* 13.1*  HGB 8.8* 9.9* 8.0* 7.9*  HCT 28.5* 29.0* 25.7* 25.4*  PLT 181  --  183 224  INR 1.44  --  1.60* 1.84*     Recent Labs  09/23/14 0400 09/23/14 1642 09/24/14 0445 09/25/14 0410  NA 136 134* 134* 133*  K 4.3 3.8 4.1 4.0  CL 98 96 96 92*  CO2 29  --  32 32  GLUCOSE 136* 112* 64* 115*  BUN 18 26* 30* 41*  CREATININE 1.03 1.30 1.13 1.22  CALCIUM 8.4  --  8.4 8.5  MG 2.6*  --  2.2 2.2  PHOS 4.4  --  2.5 3.6  PROT 6.3  --  5.7* 5.2*  ALBUMIN 3.0*  --  2.5* 2.4*  AST 246*  --  170* 101*  ALT 168*  --  162* 135*  ALKPHOS 124*  --  123* 136*  BILITOT 3.4*  --  2.2* 1.6*  TRIG  --   --   --  64   Estimated Creatinine Clearance: 64.5 mL/min (by C-G formula based on Cr of 1.22).    Recent Labs  09/25/14 0013 09/25/14 0427 09/25/14 0647  GLUCAP 81 117* 152*     Insulin Requirements in the past 24 hours:  4 units SSI  Assessment: 68 YOM with history of asystole on LifeVest, CAD, and HF presented on 09/10/14 with fatigue, dyspnea, and LE edema.  He is s/p LVAD on 09/21/14 and was doing well, leading to extubation on 09/22/14.  Discussed with MD the indication for TPN and was informed patient has an ileus.      GI: 11.8% weight loss in 3 months, minimal to no PO intake since admission (15 days) but GI tract intact - bisacodyl, docusate, Reglan, PPI IV, baseline prealbumin 12.5 on 3/16.   +BM and off BiPAP Endo: DM2 - hypoglycemic 3/22 and 3/24, CBGs now 81-152 Lytes: low Na/CL, others WNL Renal: SCr up 1.22 (range 0.88-1.3), CrCL 65 ml/min - good UOP 0.5 m/kg/hr Pulm: extubated 3/23 post LVAD, currently on 3L [place] - CT drainage decreasing - Xopenex Cards: CAD with stenting / asystole on LifeVest / HF (EF 20%) - Afib post-op.  VSS, pBNP 1237, CVP 10 - ASA, digoxin, Lasix, Crestor, amiodarone + diltiazem gtts, dopamine and milrinone infusion, need to resume Plavix Anticoag: Coumadin for recent hx B/L PE and DVT in Feb 2016, s/p IVC filter placement on 2/26 - INR 1.84, hgb down to 7.9, plts WNL Hepatobil: LFTs / tbili improving, TG WNL Neuro: scheduled APAP - GCS 15 ID: Vanc/Fortaz for PNA - afebrile, WBC trending down Best Practices: MC TPN Access: PICC placed 3/23 TPN day#: 1 (3/25 >> )  Current Nutrition:  TPN + clear liquid diet  Nutritional Goals:  1950-2150 kCal, 105-125 grams of protein per day Anticipate goal: Clinimix E 5/15 at 100 ml/hr + IVFE at 7 ml/hr = 2040 kCal and 120gm protein per day  Plan:  - Increase Clinimix E 5/15 to 80 ml/hr + continue IVFE at 10 ml/hr.  TPN will provide 1843 kCal and 96 gm protein today. - Daily multivitamin and trace elements in TPN - Continue CVTS SSI - F/U clinical progress to advance PO diet vs enteral nutrition    Curtez Brallier D. Mina Marble, PharmD, BCPS Pager:  [phone] 09/25/2014, 9:16 AM

## 2014-09-26 ENCOUNTER — Inpatient Hospital Stay (HOSPITAL_COMMUNITY): Payer: Medicare Other

## 2014-09-26 LAB — GLUCOSE, CAPILLARY
GLUCOSE-CAPILLARY: 123 mg/dL — AB (ref 70–99)
GLUCOSE-CAPILLARY: 159 mg/dL — AB (ref 70–99)
GLUCOSE-CAPILLARY: 80 mg/dL (ref 70–99)
GLUCOSE-CAPILLARY: 98 mg/dL (ref 70–99)
Glucose-Capillary: 167 mg/dL — ABNORMAL HIGH (ref 70–99)
Glucose-Capillary: 134 mg/dL — ABNORMAL HIGH (ref 70–99)
Glucose-Capillary: 127 mg/dL — ABNORMAL HIGH (ref 70–99)

## 2014-09-26 LAB — POCT I-STAT, CHEM 8
BUN: 37 mg/dL — AB (ref 6–23)
BUN: 32 mg/dL — ABNORMAL HIGH (ref 6–23)
CALCIUM ION: 1.14 mmol/L (ref 1.13–1.30)
CHLORIDE: 88 mmol/L — AB (ref 96–112)
CREATININE: 1.1 mg/dL (ref 0.50–1.35)
Calcium, Ion: 1.18 mmol/L (ref 1.13–1.30)
Chloride: 89 mmol/L — ABNORMAL LOW (ref 96–112)
Creatinine, Ser: 1.3 mg/dL (ref 0.50–1.35)
GLUCOSE: 128 mg/dL — AB (ref 70–99)
Glucose, Bld: 171 mg/dL — ABNORMAL HIGH (ref 70–99)
HCT: 31 % — ABNORMAL LOW (ref 39.0–52.0)
HCT: 31 % — ABNORMAL LOW (ref 39.0–52.0)
Hemoglobin: 10.5 g/dL — ABNORMAL LOW (ref 13.0–17.0)
Hemoglobin: 10.5 g/dL — ABNORMAL LOW (ref 13.0–17.0)
POTASSIUM: 3.5 mmol/L (ref 3.5–5.1)
POTASSIUM: 3.7 mmol/L (ref 3.5–5.1)
Sodium: 135 mmol/L (ref 135–145)
Sodium: 136 mmol/L (ref 135–145)
TCO2: 35 mmol/L (ref 0–100)
TCO2: 34 mmol/L (ref 0–100)

## 2014-09-26 LAB — COMPREHENSIVE METABOLIC PANEL
ALT: 120 U/L — ABNORMAL HIGH (ref 0–53)
AST: 97 U/L — ABNORMAL HIGH (ref 0–37)
Albumin: 2.4 g/dL — ABNORMAL LOW (ref 3.5–5.2)
Alkaline Phosphatase: 155 U/L — ABNORMAL HIGH (ref 39–117)
Anion gap: 3 — ABNORMAL LOW (ref 5–15)
BUN: 35 mg/dL — ABNORMAL HIGH (ref 6–23)
CO2: 38 mmol/L — ABNORMAL HIGH (ref 19–32)
Calcium: 8.1 mg/dL — ABNORMAL LOW (ref 8.4–10.5)
Chloride: 90 mmol/L — ABNORMAL LOW (ref 96–112)
Creatinine, Ser: 1.11 mg/dL (ref 0.50–1.35)
GFR calc Af Amer: 78 mL/min — ABNORMAL LOW (ref >90)
GFR calc non Af Amer: 67 mL/min — ABNORMAL LOW (ref >90)
Glucose, Bld: 487 mg/dL — ABNORMAL HIGH (ref 70–99)
Potassium: 4.2 mmol/L (ref 3.5–5.1)
Sodium: 131 mmol/L — ABNORMAL LOW (ref 135–145)
Total Bilirubin: 1.3 mg/dL — ABNORMAL HIGH (ref 0.3–1.2)
Total Protein: 6 g/dL (ref 6.0–8.3)

## 2014-09-26 LAB — PROTIME-INR
INR: 2.02 — ABNORMAL HIGH (ref 0.00–1.49)
Prothrombin Time: 23 seconds — ABNORMAL HIGH (ref 11.6–15.2)

## 2014-09-26 LAB — LACTATE DEHYDROGENASE: LDH: 375 U/L — ABNORMAL HIGH (ref 94–250)

## 2014-09-26 LAB — CBC WITH DIFFERENTIAL/PLATELET
Basophils Absolute: 0 10*3/uL (ref 0.0–0.1)
Basophils Relative: 0 % (ref 0–1)
Eosinophils Absolute: 0.1 10*3/uL (ref 0.0–0.7)
Eosinophils Relative: 1 % (ref 0–5)
HCT: 28.1 % — ABNORMAL LOW (ref 39.0–52.0)
Hemoglobin: 8.2 g/dL — ABNORMAL LOW (ref 13.0–17.0)
Lymphocytes Relative: 7 % — ABNORMAL LOW (ref 12–46)
Lymphs Abs: 0.9 10*3/uL (ref 0.7–4.0)
MCH: 27.6 pg (ref 26.0–34.0)
MCHC: 29.2 g/dL — ABNORMAL LOW (ref 30.0–36.0)
MCV: 94.6 fL (ref 78.0–100.0)
Monocytes Absolute: 1.1 10*3/uL — ABNORMAL HIGH (ref 0.1–1.0)
Monocytes Relative: 9 % (ref 3–12)
Neutro Abs: 9.4 10*3/uL — ABNORMAL HIGH (ref 1.7–7.7)
Neutrophils Relative %: 83 % — ABNORMAL HIGH (ref 43–77)
Platelets: 280 10*3/uL (ref 150–400)
RBC: 2.97 MIL/uL — ABNORMAL LOW (ref 4.22–5.81)
RDW: 18.2 % — ABNORMAL HIGH (ref 11.5–15.5)
WBC: 11.4 10*3/uL — ABNORMAL HIGH (ref 4.0–10.5)

## 2014-09-26 LAB — CARBOXYHEMOGLOBIN
Carboxyhemoglobin: 1.2 % (ref 0.5–1.5)
Methemoglobin: 0.9 % (ref 0.0–1.5)
O2 Saturation: 56.3 %
Total hemoglobin: 9 g/dL — ABNORMAL LOW (ref 13.5–18.0)

## 2014-09-26 LAB — PHOSPHORUS: Phosphorus: 4.5 mg/dL (ref 2.3–4.6)

## 2014-09-26 LAB — MAGNESIUM: Magnesium: 2.3 mg/dL (ref 1.5–2.5)

## 2014-09-26 MED ORDER — TRAZODONE HCL 50 MG PO TABS
50.0000 mg | ORAL_TABLET | Freq: Every day | ORAL | Status: DC
  Administered 2014-09-26 – 2014-10-04 (×8): 50 mg via ORAL
  Filled 2014-09-26 (×12): qty 1

## 2014-09-26 MED ORDER — MILRINONE IN DEXTROSE 20 MG/100ML IV SOLN: 0.1250 ug/kg/min | INTRAVENOUS | Status: DC

## 2014-09-26 MED ORDER — LOSARTAN POTASSIUM 25 MG PO TABS
12.5000 mg | ORAL_TABLET | Freq: Every day | ORAL | Status: DC
  Administered 2014-09-26 – 2014-09-27 (×2): 12.5 mg via ORAL
  Filled 2014-09-26 (×3): qty 0.5

## 2014-09-26 MED ORDER — MILRINONE IN DEXTROSE 20 MG/100ML IV SOLN
0.1250 ug/kg/min | INTRAVENOUS | Status: DC
  Administered 2014-09-26: 0.125 ug/kg/min via INTRAVENOUS
  Filled 2014-09-26: qty 100

## 2014-09-26 MED ORDER — POTASSIUM CHLORIDE 10 MEQ/50ML IV SOLN
10.0000 meq | INTRAVENOUS | Status: AC
  Administered 2014-09-26 (×3): 10 meq via INTRAVENOUS
  Filled 2014-09-26: qty 50

## 2014-09-26 MED ORDER — POTASSIUM CHLORIDE 10 MEQ/50ML IV SOLN
10.0000 meq | INTRAVENOUS | Status: AC
  Administered 2014-09-26 (×2): 10 meq via INTRAVENOUS
  Filled 2014-09-26: qty 50

## 2014-09-26 NOTE — Progress Notes (Signed)
HeartMate 2 Rounding Note  Subjective:   POD #5  VAD implant  (DT)  For acute /chronic systolic HF from ischemic cardiomyopathy and cardiogenic shock on preop IABP  Recent PE- with large R pulmonary infarct,necrotic    lung/pneumonia- poss empyema Restrictive lung disease- FEV1 1.3 from ankylosing spondylitis DM  HX ventricular, atrial arrhythmia Postoperative CO2 retention requiring BiPAP  Patient continues to progress Co-ox low for 12 hr after milrinone stopped- will resume at low dose Excellent diuresis yesterday Ileus resolved- will adv diet and stop TNA Maintaining NSR Pocket drain approx 200 cc- will leave Finishing 5 days of iv vanc, Zosyn for poos pneumonia/empyema from pulmonary infarct  Pump parameters are satisfactory. PI is improved with diuresis\  MAP now 95-100 so will resume preop low dose losartan No PI events  INR slowly rising - now 2.0  wil cont 5 mg coumadin daily  On T NA  For severe protein malnutrition with albumin 2.4 and postoperative ileus. Stop  T NA today this pm as his po diet is advanced.  Insomnia treated with xanax last pm but pt drowsy this am- will DC and resume trazodone he took preop 50mg   Q hs   HeartMate II LVAD:  Flow 4.8 liters/min, speed 9200 rpm power 4.1, PI  6.0  Controller intact  Objective:    Vital Signs:   Temp:  [96.3 F (35.7 C)-98.5 F (36.9 C)] 97.6 F (36.4 C) (03/27 0700) Pulse Rate:  [52-74] 71 (03/27 0800) Resp:  [18-37] 31 (03/27 0800) SpO2:  [92 %-100 %] 100 % (03/27 0856) Weight:  [191 lb 12.8 oz (87 kg)] 191 lb 12.8 oz (87 kg) (03/27 0500) Last BM Date: 09/26/14 Mean arterial Pressure 75  Intake/Output:   Intake/Output Summary (Last 24 hours) at 09/26/14 0926 Last data filed at 09/26/14 0900  Gross per 24 hour  Intake 2210.1 ml  Output   3950 ml  Net -1739.9 ml     Physical Exam: General:  Alert on nasal cannula HEENT: normal Neck: supple. JVP . Carotids 2+ bilat; no bruits. No lymphadenopathy or  thryomegaly appreciated. Cor: Mechanical heart sounds with LVAD hum present. Lungs: coarse bs on R, moving better air today Abdomen: soft, nontender, nondistended. No hepatosplenomegaly. No bruits or masses. Good bowel sounds. Driveline: C/D/I; securement device intact and driveline incorporated Extremities: warm well perfused,no cyanosis, clubbing, rash, edema- IABP out without hematoma and groin- c/o pain in R middle finger - prob related to a-line Neuro: moves all extremities   Telemetry:nsr  Labs: Basic Metabolic Panel:  Recent Labs Lab 09/22/14 0300  09/22/14 1710  09/23/14 0400  09/24/14 0445 09/25/14 0410 09/25/14 1609 09/26/14 0412 09/26/14 0509  NA 137  < >  --   < > 136  < > 134* 133* 128* 131* 135  K 4.0  < >  --   < > 4.3  < > 4.1 4.0 4.0 4.2 3.5  CL 98  < >  --   < > 98  < > 96 92* 95* 90* 88*  CO2 31  --   --   --  29  --  32 32  --  38*  --   GLUCOSE 115*  < >  --   < > 136*  < > 64* 115* 112* 487* 171*  BUN 13  < >  --   < > 18  < > 30* 41* 37* 35* 37*  CREATININE 0.91  < > 1.04  < > 1.03  < >  1.13 1.22 1.60* 1.11 1.30  CALCIUM 8.8  --   --   --  8.4  --  8.4 8.5  --  8.1*  --   MG 2.5  --  2.2  --  2.6*  --  2.2 2.2  --  2.3  --   PHOS 3.6  --   --   --  4.4  --  2.5 3.6  --  4.5  --   < > = values in this interval not displayed.  Liver Function Tests:  Recent Labs Lab 09/22/14 0300 09/23/14 0400 09/24/14 0445 09/25/14 0410 09/26/14 0412  AST 149* 246* 170* 101* 97*  ALT 94* 168* 162* 135* 120*  ALKPHOS 107 124* 123* 136* 155*  BILITOT 3.0* 3.4* 2.2* 1.6* 1.3*  PROT 5.9* 6.3 5.7* 5.2* 6.0  ALBUMIN 3.2* 3.0* 2.5* 2.4* 2.4*   No results for input(s): LIPASE, AMYLASE in the last 168 hours. No results for input(s): AMMONIA in the last 168 hours.  CBC:  Recent Labs Lab 09/22/14 0300  09/22/14 1710  09/23/14 0400  09/24/14 0445 09/25/14 0410 09/25/14 1609 09/26/14 0412 09/26/14 0509  WBC 13.3*  --  14.9*  --  15.8*  --  14.8* 13.1*  --   11.4*  --   NEUTROABS 10.9*  --   --   --  13.9*  --  12.8* 11.0*  --  9.4*  --   HGB 7.8*  < > 9.0*  < > 8.8*  < > 8.0* 7.9* 9.5* 8.2* 10.5*  HCT 24.2*  < > 29.1*  < > 28.5*  < > 25.7* 25.4* 28.0* 28.1* 31.0*  MCV 86.7  --  89.5  --  90.5  --  88.6 88.8  --  94.6  --   PLT 176  --  176  --  181  --  183 224  --  280  --   < > = values in this interval not displayed.  INR:  Recent Labs Lab 09/22/14 0300 09/23/14 0400 09/24/14 0445 09/25/14 0410 09/26/14 0412  INR 1.42 1.44 1.60* 1.84* 2.02*    Other results:  EKG:   Imaging: Dg Chest Port 1 View  09/26/2014   CLINICAL DATA:  Left ventricular assist device. Reported history of pulmonary infarct.  EXAM: PORTABLE CHEST - 1 VIEW  COMPARISON:  09/25/2014  FINDINGS: Right-sided PICC line in place with tip over the right atrium. Left ventricular assist device partly visualized. Evidence of median sternotomy again noted. Moderate enlargement of the cardiac silhouette is reidentified. Left sided chest tube has been removed. Slight increase in hazy patchy left mid lung zone and retrocardiac airspace opacities. Peripheral right lower lobe airspace opacity is increased in density since previously.  IMPRESSION: Increased patchy left mid lung zone airspace opacity and retrocardiac opacity which could indicate contusion after removal of left-sided chest tube.  Increased density of right lower lobe airspace opacity compatible with reported pulmonary infarct.   Electronically Signed   By: Conchita Paris M.D.   On: 09/26/2014 08:52   Dg Chest Port 1 View  09/25/2014   CLINICAL DATA:  Cardiomyopathy.  Recent pneumonia  EXAM: PORTABLE CHEST - 1 VIEW  COMPARISON:  September 24, 2014  FINDINGS: Right chest tube has been removed. Left chest tube remains. Peripherally inserted central catheter tip is in the superior vena cava. Cordis tip is in the superior vena cava. No pneumothorax. There is persistent consolidation in the right lung base as well as behind the  left heart. There is stable cardiomegaly. There is a left ventricular assist device present, incompletely visualized on the current examination. No adenopathy.  IMPRESSION: Tube and catheter positions as described without pneumothorax. Bibasilar consolidation, stable. No change in cardiomegaly.   Electronically Signed   By: Lowella Grip III M.D.   On: 09/25/2014 12:52     Medications:     Scheduled Medications: . acetaminophen  1,000 mg Oral 4 times per day   Or  . acetaminophen (TYLENOL) oral liquid 160 mg/5 mL  1,000 mg Per Tube 4 times per day  . amiodarone  400 mg Oral BID  . antiseptic oral rinse  7 mL Mouth Rinse BID  . aspirin EC  325 mg Oral Daily   Or  . aspirin  324 mg Per Tube Daily  . bisacodyl  10 mg Oral Daily   Or  . bisacodyl  10 mg Rectal Daily  . cefTAZidime (FORTAZ)  IV  1 g Intravenous 3 times per day  . docusate sodium  200 mg Oral Daily  . ferrous [id number] C-folic acid  1 capsule Oral TID PC  . furosemide  40 mg Intravenous BID  . insulin aspart  0-24 Units Subcutaneous 6 times per day  . levalbuterol  1.25 mg Nebulization Q6H  . losartan  12.5 mg Oral Daily  . metoCLOPramide (REGLAN) injection  10 mg Intravenous 4 times per day  . pantoprazole (PROTONIX) IV  40 mg Intravenous Daily  . potassium chloride  10 mEq Intravenous Q1 Hr x 2  . rosuvastatin  10 mg Oral q1800  . sodium chloride  10-40 mL Intracatheter Q12H  . sodium chloride  10-40 mL Intracatheter Q12H  . sodium chloride  3 mL Intravenous Q12H  . traZODone  50 mg Oral QHS  . warfarin  5 mg Oral q1800  . Warfarin - Physician Dosing Inpatient   Does not apply q1800    Infusions: . Marland KitchenTPN (CLINIMIX-E) Adult 80 mL/hr at 09/26/14 0900   And  . fat emulsion 240 mL (09/26/14 0900)  . sodium chloride Stopped (09/24/14 1900)  . sodium chloride    . sodium chloride 20 mL/hr at 09/23/14 1900  . lactated ringers 10 mL/hr at 09/26/14 0900  . lactated ringers    . milrinone Stopped  (09/25/14 0430)    PRN Medications: sodium chloride, ketorolac, ondansetron (ZOFRAN) IV, sodium chloride, sodium chloride, sodium chloride, traMADol   Assessment:  Stable hemodynamics- no PI events, VAD speed 9400 C02 improved off BiPAP Wt still up- cont bid lasix  Continue to remove chest tubes leaving only pocket drain  Plan/Discussion:     -Ambulate with physical therapy INR slowly rising, 2.0 on Coumadin 5 mg daily stopTPN  Add low dose losartan for inc MAP Add low dose milrinone for low consecutive Co-ox  I reviewed the LVAD parameters from today, and compared the results to the patient's prior recorded data.  No programming changes were made.  The LVAD is functioning within specified parameters.  The patient performs LVAD self-test daily.  LVAD interrogation was negative for any significant power changes, alarms or PI events/speed drops.  LVAD equipment check completed and is in good working order.  Back-up equipment present.   LVAD education done on emergency procedures and precautions and reviewed exit site care.  Length of Stay: [place] III 09/26/2014, 9:26 AM

## 2014-09-26 NOTE — Progress Notes (Signed)
PARENTERAL NUTRITION CONSULT NOTE - FOLLOW UP  Pharmacy Consult:  TPN Indication:  Ileus  No Known Allergies  Patient Measurements: Height: 5\' 8"  (172.7 cm) Weight: 191 lb 12.8 oz (87 kg) IBW/kg (Calculated) : 68.4  Vital Signs: Temp: 97.6 F (36.4 C) (03/27 0400) Temp Source: Oral (03/27 0400) Pulse Rate: 72 (03/27 0700) Intake/Output from previous day: 03/26 0701 - 03/27 0700 In: 2263.5 [I.V.:393.5; IV Piggyback:400; TPN:1470] Out: 2035 [Urine:3950; Chest Tube:270]  Labs:  Recent Labs  09/24/14 0445 09/25/14 0410 09/25/14 1609 09/26/14 0412 09/26/14 0509  WBC 14.8* 13.1*  --  11.4*  --   HGB 8.0* 7.9* 9.5* 8.2* 10.5*  HCT 25.7* 25.4* 28.0* 28.1* 31.0*  PLT 183 224  --  280  --   INR 1.60* 1.84*  --  2.02*  --      Recent Labs  09/24/14 0445 09/25/14 0410 09/25/14 1609 09/26/14 0412 09/26/14 0509  NA 134* 133* 128* 131* 135  K 4.1 4.0 4.0 4.2 3.5  CL 96 92* 95* 90* 88*  CO2 32 32  --  38*  --   GLUCOSE 64* 115* 112* 487* 171*  BUN 30* 41* 37* 35* 37*  CREATININE 1.13 1.22 1.60* 1.11 1.30  CALCIUM 8.4 8.5  --  8.1*  --   MG 2.2 2.2  --  2.3  --   PHOS 2.5 3.6  --  4.5  --   PROT 5.7* 5.2*  --  6.0  --   ALBUMIN 2.5* 2.4*  --  2.4*  --   AST 170* 101*  --  97*  --   ALT 162* 135*  --  120*  --   ALKPHOS 123* 136*  --  155*  --   BILITOT 2.2* 1.6*  --  1.3*  --   TRIG  --  64  --   --   --    Estimated Creatinine Clearance: 59.9 mL/min (by C-G formula based on Cr of 1.3).    Recent Labs  09/25/14 1951 09/25/14 2358 09/26/14 0401  GLUCAP 158* 123* 159*     Insulin Requirements in the past 24 hours:  8 units SSI  Assessment: 44 YOM with history of asystole on LifeVest, CAD, and HF presented on 09/10/14 with fatigue, dyspnea, and LE edema.  He is s/p LVAD on 09/21/14 and was doing well, leading to extubation on 09/22/14.  Discussed with MD the indication for TPN and was informed patient has an ileus.      GI: 11.8% weight loss in 3 months,  minimal to no PO intake since admission (15 days) but GI tract intact - bisacodyl, docusate, Reglan, PPI IV, baseline prealbumin 12.5 on 3/16.  +BM and off BiPAP Endo: DM2 - hypoglycemic 3/22 and 3/24, CBGs now 123-171 (trending up, value of 487 on CMET likely contaminated) Lytes: AM lab likely contaminated, repeat labs - low CL, others WNL (iCa WNL on 3/27) Renal: SCr up 1.3 (range 0.88-1.3), CrCL 65 ml/min - good UOP 1.9 m/kg/hr Pulm: extubated 3/23 post LVAD, decreased to 2L [place], off nitric oxide - CT drainage decreasing - Xopenex Cards: CAD with stenting / asystole on LifeVest / HF (EF 20%) - Afib post-op.  VSS, pBNP 1237, CVP 12 - ASA, Lasix IV, Crestor, amiodarone, need to resume Plavix Anticoag: Coumadin for recent hx B/L PE and DVT in Feb 2016, s/p IVC filter placement on 2/26 - INR 2.02, hgb 10.5, plts WNL Hepatobil: LFTs / tbili improving, TG WNL Neuro: scheduled APAP,  Xanax - GCS 15 ID: Fortaz for PNA, s/p vanc - afebrile, WBC trending down Best Practices: Coumadin, MC TPN Access: PICC placed 3/23 TPN day#: 2 (3/25 >> )  Current Nutrition:  TPN + full liquid diet  Nutritional Goals:  1950-2150 kCal, 105-125 grams of protein per day Anticipate goal: Clinimix E 5/15 at 100 ml/hr + IVFE at 7 ml/hr = 2040 kCal and 120gm protein per day   Plan:  - D/C TPN post this bag per MD - Placed order for RN to reduce TPN to 60ml/hr then d/c tonight at 1800 (RN aware) - D/C TPN labs    Kana Reimann D. Mina Marble, PharmD, BCPS Pager:  [phone] 09/26/2014, 9:36 AM

## 2014-09-26 NOTE — Progress Notes (Signed)
HeartMate 2 Rounding Note  Subjective:    Post Day 5 - HMII LVAD   Felt badly after milrinone stopped yesterday. Was hypothermic. Bair Hugger started. Feeling better now. Appetite improving  Walked x 1 yesterday. VAD speed up to 9400. Diuresed 4 pounds.   LVAD INTERROGATION:  HeartMate II LVAD:  Flow 4.7 liters/min, speed 9400 power 6.0, PI 6.5 No PI events over night.     Objective:    Vital Signs:   Temp:  [96.1 F (35.6 C)-98.5 F (36.9 C)] 96.1 F (35.6 C) (03/27 1200) Pulse Rate:  [52-79] 77 (03/27 1200) Resp:  [18-39] 27 (03/27 1200) SpO2:  [92 %-100 %] 100 % (03/27 1200) Weight:  [87 kg (191 lb 12.8 oz)] 87 kg (191 lb 12.8 oz) (03/27 0500) Last BM Date: 09/26/14 Mean arterial Pressure  84-86  Intake/Output:   Intake/Output Summary (Last 24 hours) at 09/26/14 1232 Last data filed at 09/26/14 1200  Gross per 24 hour  Intake 2285.39 ml  Output   4750 ml  Net -2464.61 ml     Physical Exam: General:  Awake, sitting in chira HEENT: normal Neck: supple. Carotids 2+ bilat; no bruits. No lymphadenopathy or thryomegaly appreciated.  Cor: Mechanical heart sounds with LVAD hum present. Sternal dressing intact.   Lungs: clear Abdomen: soft, nontender, nondistended. No hepatosplenomegaly. No bruits or masses. Good bowel sounds. Driveline: Dressing looks good.  Extremities: no cyanosis, clubbing, rash, tr edema R and LLE SCDs  Neuro: Awake follows commands  Telemetry: NSR 80s   Labs: Basic Metabolic Panel:  Recent Labs Lab 09/22/14 0300  09/22/14 1710  09/23/14 0400  09/24/14 0445 09/25/14 0410 09/25/14 1609 09/26/14 0412 09/26/14 0509  NA 137  < >  --   < > 136  < > 134* 133* 128* 131* 135  K 4.0  < >  --   < > 4.3  < > 4.1 4.0 4.0 4.2 3.5  CL 98  < >  --   < > 98  < > 96 92* 95* 90* 88*  CO2 31  --   --   --  29  --  32 32  --  38*  --   GLUCOSE 115*  < >  --   < > 136*  < > 64* 115* 112* 487* 171*  BUN 13  < >  --   < > 18  < > 30* 41* 37* 35* 37*   CREATININE 0.91  < > 1.04  < > 1.03  < > 1.13 1.22 1.60* 1.11 1.30  CALCIUM 8.8  --   --   --  8.4  --  8.4 8.5  --  8.1*  --   MG 2.5  --  2.2  --  2.6*  --  2.2 2.2  --  2.3  --   PHOS 3.6  --   --   --  4.4  --  2.5 3.6  --  4.5  --   < > = values in this interval not displayed.  Liver Function Tests:  Recent Labs Lab 09/22/14 0300 09/23/14 0400 09/24/14 0445 09/25/14 0410 09/26/14 0412  AST 149* 246* 170* 101* 97*  ALT 94* 168* 162* 135* 120*  ALKPHOS 107 124* 123* 136* 155*  BILITOT 3.0* 3.4* 2.2* 1.6* 1.3*  PROT 5.9* 6.3 5.7* 5.2* 6.0  ALBUMIN 3.2* 3.0* 2.5* 2.4* 2.4*   No results for input(s): LIPASE, AMYLASE in the last 168 hours. No results for input(s): AMMONIA  in the last 168 hours.  CBC:  Recent Labs Lab 09/22/14 0300  09/22/14 1710  09/23/14 0400  09/24/14 0445 09/25/14 0410 09/25/14 1609 09/26/14 0412 09/26/14 0509  WBC 13.3*  --  14.9*  --  15.8*  --  14.8* 13.1*  --  11.4*  --   NEUTROABS 10.9*  --   --   --  13.9*  --  12.8* 11.0*  --  9.4*  --   HGB 7.8*  < > 9.0*  < > 8.8*  < > 8.0* 7.9* 9.5* 8.2* 10.5*  HCT 24.2*  < > 29.1*  < > 28.5*  < > 25.7* 25.4* 28.0* 28.1* 31.0*  MCV 86.7  --  89.5  --  90.5  --  88.6 88.8  --  94.6  --   PLT 176  --  176  --  181  --  183 224  --  280  --   < > = values in this interval not displayed.  INR:  Recent Labs Lab 09/22/14 0300 09/23/14 0400 09/24/14 0445 09/25/14 0410 09/26/14 0412  INR 1.42 1.44 1.60* 1.84* 2.02*    Other results:    Imaging: Dg Chest Port 1 View  09/26/2014   CLINICAL DATA:  Left ventricular assist device. Reported history of pulmonary infarct.  EXAM: PORTABLE CHEST - 1 VIEW  COMPARISON:  09/25/2014  FINDINGS: Right-sided PICC line in place with tip over the right atrium. Left ventricular assist device partly visualized. Evidence of median sternotomy again noted. Moderate enlargement of the cardiac silhouette is reidentified. Left sided chest tube has been removed. Slight  increase in hazy patchy left mid lung zone and retrocardiac airspace opacities. Peripheral right lower lobe airspace opacity is increased in density since previously.  IMPRESSION: Increased patchy left mid lung zone airspace opacity and retrocardiac opacity which could indicate contusion after removal of left-sided chest tube.  Increased density of right lower lobe airspace opacity compatible with reported pulmonary infarct.   Electronically Signed   By: Conchita Paris M.D.   On: 09/26/2014 08:52   Dg Chest Port 1 View  09/25/2014   CLINICAL DATA:  Cardiomyopathy.  Recent pneumonia  EXAM: PORTABLE CHEST - 1 VIEW  COMPARISON:  September 24, 2014  FINDINGS: Right chest tube has been removed. Left chest tube remains. Peripherally inserted central catheter tip is in the superior vena cava. Cordis tip is in the superior vena cava. No pneumothorax. There is persistent consolidation in the right lung base as well as behind the left heart. There is stable cardiomegaly. There is a left ventricular assist device present, incompletely visualized on the current examination. No adenopathy.  IMPRESSION: Tube and catheter positions as described without pneumothorax. Bibasilar consolidation, stable. No change in cardiomegaly.   Electronically Signed   By: Lowella Grip III M.D.   On: 09/25/2014 12:52     Medications:     Scheduled Medications: . acetaminophen  1,000 mg Oral 4 times per day   Or  . acetaminophen (TYLENOL) oral liquid 160 mg/5 mL  1,000 mg Per Tube 4 times per day  . amiodarone  400 mg Oral BID  . antiseptic oral rinse  7 mL Mouth Rinse BID  . aspirin EC  325 mg Oral Daily   Or  . aspirin  324 mg Per Tube Daily  . bisacodyl  10 mg Oral Daily   Or  . bisacodyl  10 mg Rectal Daily  . cefTAZidime (FORTAZ)  IV  1 g Intravenous  3 times per day  . docusate sodium  200 mg Oral Daily  . ferrous [id number] C-folic acid  1 capsule Oral TID PC  . furosemide  40 mg Intravenous BID  . insulin  aspart  0-24 Units Subcutaneous 6 times per day  . levalbuterol  1.25 mg Nebulization Q6H  . losartan  12.5 mg Oral Daily  . metoCLOPramide (REGLAN) injection  10 mg Intravenous 4 times per day  . pantoprazole (PROTONIX) IV  40 mg Intravenous Daily  . rosuvastatin  10 mg Oral q1800  . sodium chloride  10-40 mL Intracatheter Q12H  . sodium chloride  10-40 mL Intracatheter Q12H  . sodium chloride  3 mL Intravenous Q12H  . traZODone  50 mg Oral QHS  . warfarin  5 mg Oral q1800  . Warfarin - Physician Dosing Inpatient   Does not apply q1800    Infusions: . Marland KitchenTPN (CLINIMIX-E) Adult 40 mL/hr at 09/26/14 1200   And  . fat emulsion 240 mL (09/26/14 1200)  . sodium chloride Stopped (09/24/14 1900)  . sodium chloride    . sodium chloride 20 mL/hr at 09/23/14 1900  . lactated ringers 10 mL/hr at 09/26/14 1200  . lactated ringers    . milrinone 0.125 mcg/kg/min (09/26/14 1200)    PRN Medications: sodium chloride, ketorolac, ondansetron (ZOFRAN) IV, sodium chloride, sodium chloride, sodium chloride, traMADol   Assessment:  1. S/P HMII LVAD 09/21/14  2.  A/C systolic HF class IV with cardiogenic shock- EF 15% with moderate RV dysfunction 3/16 echo.  3.  CAD s/p Anterior STEMI on 06/12/14 with stenting of LAD- on coumadin + plavix, statin.  4.  06/12/14 VT in setting of STEMI 5.  Ankylosing spondylitis. 6. Bilateral PE with R DVT- S/P IVC filter 08/27/2014--->coumadin 7.  Asystole noted from Life Vest 8.  LV thrombus 9.  Full Code 10. Hemoptysis, resolved 11. A/C renal failure, resolved 12. Pleural effusion, transudative  13. ?PNA 14. Acute Blood Loss- Anemia- Received 1UPRBCs 3/23  Plan/Discussion:   POD #5 HMII LVAD.   Doing  well. Diuresing steadily. Agree with daily lasix.Didn't do well with milrinone wean yesterday now back on at 0.125. Will continue 1 more day and try to stop tomorrow,  Back on amio for recurrent AF. Now in NSR. Will continue IV amio one more day switch to  po in am. Doing well with diet. TPN being stopped.   On coumadin INR 2.0. Will discuss Plavix with Dr. [name].  I reviewed the LVAD parameters from today, and compared the results to the patient's prior recorded data.  No programming changes were made.  The LVAD is functioning within specified parameters.  The patient performs LVAD self-test daily.  LVAD interrogation was negative for any significant power changes, alarms or PI events/speed drops.  LVAD equipment check completed and is in good working order.  Back-up equipment present.   LVAD education done on emergency procedures and precautions and reviewed exit site care.  Length of Stay: 86  [name]  MD  09/26/2014, 12:32 PM  VAD Team --- VAD ISSUES ONLY--- Pager [phone] (7am - 7am)  Advanced Heart Failure Team  Pager [phone] (M-F; 7a - 4p)  Please contact [facility] for night-coverage after hours (4p -7a ) and weekends on [email or web address]

## 2014-09-27 ENCOUNTER — Inpatient Hospital Stay (HOSPITAL_COMMUNITY): Payer: Medicare Other

## 2014-09-27 LAB — MAGNESIUM: Magnesium: 1.9 mg/dL (ref 1.5–2.5)

## 2014-09-27 LAB — COMPREHENSIVE METABOLIC PANEL
ALT: 96 U/L — ABNORMAL HIGH (ref 0–53)
AST: 81 U/L — ABNORMAL HIGH (ref 0–37)
Albumin: 2.2 g/dL — ABNORMAL LOW (ref 3.5–5.2)
Alkaline Phosphatase: 172 U/L — ABNORMAL HIGH (ref 39–117)
Anion gap: 9 (ref 5–15)
BUN: 27 mg/dL — ABNORMAL HIGH (ref 6–23)
CO2: 39 mmol/L — ABNORMAL HIGH (ref 19–32)
Calcium: 8.2 mg/dL — ABNORMAL LOW (ref 8.4–10.5)
Chloride: 90 mmol/L — ABNORMAL LOW (ref 96–112)
Creatinine, Ser: 0.97 mg/dL (ref 0.50–1.35)
GFR calc Af Amer: >90 mL/min (ref >90)
GFR calc non Af Amer: 84 mL/min — ABNORMAL LOW (ref >90)
Glucose, Bld: 105 mg/dL — ABNORMAL HIGH (ref 70–99)
Potassium: 3.6 mmol/L (ref 3.5–5.1)
Sodium: 138 mmol/L (ref 135–145)
Total Bilirubin: 1.5 mg/dL — ABNORMAL HIGH (ref 0.3–1.2)
Total Protein: 5.7 g/dL — ABNORMAL LOW (ref 6.0–8.3)

## 2014-09-27 LAB — CARBOXYHEMOGLOBIN
Carboxyhemoglobin: 1.6 % — ABNORMAL HIGH (ref 0.5–1.5)
Methemoglobin: 0.9 % (ref 0.0–1.5)
O2 Saturation: 67.9 %
Total hemoglobin: 11.1 g/dL — ABNORMAL LOW (ref 13.5–18.0)

## 2014-09-27 LAB — CBC WITH DIFFERENTIAL/PLATELET
Basophils Absolute: 0.1 10*3/uL (ref 0.0–0.1)
Basophils Relative: 0 % (ref 0–1)
Eosinophils Absolute: 0.2 10*3/uL (ref 0.0–0.7)
Eosinophils Relative: 1 % (ref 0–5)
HCT: 31.2 % — ABNORMAL LOW (ref 39.0–52.0)
Hemoglobin: 9.3 g/dL — ABNORMAL LOW (ref 13.0–17.0)
Lymphocytes Relative: 7 % — ABNORMAL LOW (ref 12–46)
Lymphs Abs: 1 10*3/uL (ref 0.7–4.0)
MCH: 26.9 pg (ref 26.0–34.0)
MCHC: 29.8 g/dL — ABNORMAL LOW (ref 30.0–36.0)
MCV: 90.2 fL (ref 78.0–100.0)
Monocytes Absolute: 1.7 10*3/uL — ABNORMAL HIGH (ref 0.1–1.0)
Monocytes Relative: 11 % (ref 3–12)
Neutro Abs: 12.5 10*3/uL — ABNORMAL HIGH (ref 1.7–7.7)
Neutrophils Relative %: 81 % — ABNORMAL HIGH (ref 43–77)
Platelets: 405 10*3/uL — ABNORMAL HIGH (ref 150–400)
RBC: 3.46 MIL/uL — ABNORMAL LOW (ref 4.22–5.81)
RDW: 17.5 % — ABNORMAL HIGH (ref 11.5–15.5)
WBC: 15.4 10*3/uL — ABNORMAL HIGH (ref 4.0–10.5)

## 2014-09-27 LAB — LACTATE DEHYDROGENASE: LDH: 398 U/L — ABNORMAL HIGH (ref 94–250)

## 2014-09-27 LAB — GLUCOSE, CAPILLARY
GLUCOSE-CAPILLARY: 96 mg/dL (ref 70–99)
GLUCOSE-CAPILLARY: 142 mg/dL — AB (ref 70–99)
GLUCOSE-CAPILLARY: 93 mg/dL (ref 70–99)
GLUCOSE-CAPILLARY: 111 mg/dL — AB (ref 70–99)
Glucose-Capillary: 93 mg/dL (ref 70–99)

## 2014-09-27 LAB — URINE CULTURE
Colony Count: NO GROWTH
Culture: NO GROWTH

## 2014-09-27 LAB — URINALYSIS, ROUTINE W REFLEX MICROSCOPIC
Bilirubin Urine: NEGATIVE
GLUCOSE, UA: NEGATIVE mg/dL
KETONES UR: NEGATIVE mg/dL
Nitrite: NEGATIVE
Protein, ur: NEGATIVE mg/dL
Specific Gravity, Urine: 1.012 (ref 1.005–1.030)
Urobilinogen, UA: 0.2 mg/dL (ref 0.0–1.0)
pH: 6 (ref 5.0–8.0)

## 2014-09-27 LAB — URINE MICROSCOPIC-ADD ON

## 2014-09-27 LAB — PROTIME-INR
INR: 1.81 — ABNORMAL HIGH (ref 0.00–1.49)
Prothrombin Time: 21.2 seconds — ABNORMAL HIGH (ref 11.6–15.2)

## 2014-09-27 MED ORDER — METOCLOPRAMIDE HCL 10 MG PO TABS
10.0000 mg | ORAL_TABLET | Freq: Three times a day (TID) | ORAL | Status: DC
  Administered 2014-09-27 (×2): 10 mg via ORAL
  Filled 2014-09-27 (×6): qty 1

## 2014-09-27 MED ORDER — WARFARIN - PHARMACIST DOSING INPATIENT
Freq: Every day | Status: DC
  Administered 2014-10-01: 18:00:00

## 2014-09-27 MED ORDER — FUROSEMIDE 10 MG/ML IJ SOLN: 40.0000 mg | Freq: Every day | INTRAMUSCULAR | Status: DC

## 2014-09-27 MED ORDER — AMOXICILLIN-POT CLAVULANATE 875-125 MG PO TABS
1.0000 | ORAL_TABLET | Freq: Two times a day (BID) | ORAL | Status: DC
  Administered 2014-09-27 (×2): 1 via ORAL
  Filled 2014-09-27 (×4): qty 1

## 2014-09-27 MED ORDER — AMIODARONE HCL 200 MG PO TABS
200.0000 mg | ORAL_TABLET | Freq: Two times a day (BID) | ORAL | Status: DC
  Administered 2014-09-27 – 2014-10-04 (×16): 200 mg via ORAL
  Filled 2014-09-27 (×20): qty 1

## 2014-09-27 MED ORDER — METOLAZONE 5 MG PO TABS
5.0000 mg | ORAL_TABLET | Freq: Every day | ORAL | Status: DC
  Filled 2014-09-27: qty 1

## 2014-09-27 MED ORDER — POTASSIUM CHLORIDE 10 MEQ/50ML IV SOLN
10.0000 meq | INTRAVENOUS | Status: AC
  Administered 2014-09-27 (×2): 10 meq via INTRAVENOUS
  Filled 2014-09-27 (×3): qty 50

## 2014-09-27 MED ORDER — PANTOPRAZOLE SODIUM 40 MG PO TBEC
40.0000 mg | DELAYED_RELEASE_TABLET | Freq: Every day | ORAL | Status: DC
  Administered 2014-09-27 – 2014-10-04 (×8): 40 mg via ORAL
  Filled 2014-09-27 (×6): qty 1

## 2014-09-27 MED ORDER — POTASSIUM CHLORIDE 10 MEQ/50ML IV SOLN
10.0000 meq | INTRAVENOUS | Status: AC
  Administered 2014-09-27 (×3): 10 meq via INTRAVENOUS
  Filled 2014-09-27 (×2): qty 50

## 2014-09-27 MED ORDER — GABAPENTIN 300 MG PO CAPS
300.0000 mg | ORAL_CAPSULE | Freq: Two times a day (BID) | ORAL | Status: DC
  Administered 2014-09-27 – 2014-10-04 (×15): 300 mg via ORAL
  Filled 2014-09-27 (×20): qty 1

## 2014-09-27 MED ORDER — WARFARIN SODIUM 7.5 MG PO TABS
7.5000 mg | ORAL_TABLET | Freq: Once | ORAL | Status: AC
  Administered 2014-09-27: 7.5 mg via ORAL
  Filled 2014-09-27: qty 1

## 2014-09-27 NOTE — Progress Notes (Signed)
Pt blinds closed and curtain pulled.  Pt wife was found at bedside with pt turned on his side, with pt pulling against side rail.  Educated pt wife and pt of the importance of this RN being able to see his VAD monitor as well as the importance of calling staff for assistance.  Reeducated pt and pt wife of the importance of not pushing or pulling with his arm d/t sternal precautions.  Will continue to monitor pt closely.

## 2014-09-27 NOTE — Progress Notes (Signed)
Admitted 09/10/14 with worsening heart failure and cardiogenic shock.  HeartMate II LVAD implated on 09/21/14 by Dr. Darcey Nora as DT VAD.   Vital signs: Temp:  97.5 - 98.3 (oral) HR:  88 - 97, maintaining NSR Doppler MAP:  78 - 82  O2 Sat:  96 - 100% CVP: 10 - 8   Wt in lbs:  173 > 194 > 194.7 > 192.7   LVAD interrogation reveals:  Speed:  9400 Flow: 5.3 Power:  5 PI:  4.1 Alarms: none Events:  2 PI events in the last 48h Fixed speed:  9400 Low speed limit: 8800  Blood products: OR:  1 unit PC, 2 plts, 3 FFP ICU: 09/22/14 - 1 unit PC   Gtts: None (Milrinone off 1000 09/27/14)  Nitric Oxide:  Off 09/24/14  ASA:  325 mg started 09/22/14  Warfarin: started 09/22/14  Plavix: 09/27/14 Not restarting per Dr. Haroldine Laws  CVP:  8 - 10 today    Labs:  LDH trend: 364 > 496 > 380 > 341 > 375 > 398  INR trend: 1.42 > 1.44 > 1.6 > 1.84 > 2.02 > 1.81  Co-Ox:  67.9% today off Dopamine/Milrinone   WBC:  13.3 > 15.8 > 14.8 > 13.1 > 11.4 > 15.4 (Urine Culture sent)  Hgb:  7.8 (received one unit PC) > 9.0 > 8.8 > 8.0 > 7.9 > 8.2 > 9.3  Drive Line: C/D/I with stabilization device in place. Being changed daily. Will come up today when RN changes it to look at the site.    Plan/Recommendations and Discharge Needs as discussed with VAD team:  1.  Tolerating diet well--TNA off. Continue to push nutrition.   2.  Right hand pain--pain, weakness and some purple discoloration to the right thumb/forefinger/index finger. Arterial line on this side previously. Dr. [name] started Neurontin today. Mr. Turay reports that the pain is improving slowly, but still has a way to go. Making it difficult to practice switching from power sources, but encouraged him to still try to participate as much as he can.   3.  EtCO2 monitor reading 32 on nasal cannula. Continue to push pulmonary toilet and mobility. No longer requiring BiPAP (last used the morning of 09/24/14)   4. WBC elevated today--urine  culture sent, will get a better look at the drive line site today also to assess.   5. Walked the farthest he has so far since surgery this morning. He states that his tolerance for walking is improving. PT recommending home health PT and Rollator walker.   6. Planning on education session with his wife and their second caregiver Friday 10/01/14 @ 0900. Home equipment has been ordered and will begin sterile technique and dressing changes with his wife. Will bring up the discharge binder to start reviewing today as well.

## 2014-09-27 NOTE — Progress Notes (Signed)
HeartMate 2 Rounding Note  Subjective:   POD #6  VAD implant  (DT)  For acute /chronic systolic HF from ischemic cardiomyopathy and cardiogenic shock on preop IABP  Recent PE- with large R pulmonary infarct,necrotic    lung/pneumonia- poss empyema Restrictive lung disease- FEV1 1.3 from ankylosing spondylitis DM  HX ventricular, atrial arrhythmia Postoperative CO2 retention requiring BiPAP- now resolved  Patient continues to progress- walked with PT 200 ft Co-ox limproved  after milrinoneresumed- will cont at low dose Excellent diuresis yesterday- cont iv lasix Ileus resolved-  TNA stopped Maintaining NSR Pocket drain approx 200 cc- will leave Finishing 5 days of iv vanc, Zosyn for poss pneumonia/empyema from pulmonary infarct. Transition to po augmentin  Pump parameters are satisfactory. PI is improved with diuresis\  MAP now 80-90 on  preop low dose losartan  one PI event since surgery  INR slowly rising - now 2.0  wil cont 5 mg coumadin daily  became weak after standing from bedside commode but did not fall  Insomnia treated with xanax last pm but pt drowsy this am- will DC and resume trazodone he took preop 50mg   Q hs   HeartMate II LVAD:  Flow 5.2 liters/min, speed 9400 rpm power 5.1, PI  4.8  Controller intact  Objective:    Vital Signs:   Temp:  [96.1 F (35.6 C)-97.8 F (36.6 C)] 97.8 F (36.6 C) (03/28 0000) Pulse Rate:  [34-97] 95 (03/28 0700) Resp:  [18-39] 36 (03/28 0700) SpO2:  [95 %-100 %] 100 % (03/28 0700) Weight:  [187 lb 2.7 oz (84.9 kg)-190 lb 0.6 oz (86.2 kg)] 187 lb 2.7 oz (84.9 kg) (03/28 0800) Last BM Date: 09/26/14 Mean arterial Pressure 75  Intake/Output:   Intake/Output Summary (Last 24 hours) at 09/27/14 0837 Last data filed at 09/27/14 0700  Gross per 24 hour  Intake 1234.79 ml  Output   4355 ml  Net -3120.21 ml     Physical Exam: General:  Alert on nasal cannula HEENT: normal Neck: supple. JVP . Carotids 2+ bilat; no bruits. No  lymphadenopathy or thryomegaly appreciated. Cor: Mechanical heart sounds with LVAD hum present. Lungs: coarse bs on R, moving better air today Abdomen: soft, nontender, nondistended. No hepatosplenomegaly. No bruits or masses. Good bowel sounds. Driveline: C/D/I; securement device intact and driveline incorporated Extremities: warm well perfused,no cyanosis, clubbing, rash, edema- IABP out without hematoma and groin- c/o pain in R middle finger - prob related to a-line Neuro: moves all extremities   Telemetry:nsr  Labs: Basic Metabolic Panel:  Recent Labs Lab 09/22/14 0300  09/23/14 0400  09/24/14 0445 09/25/14 0410 09/25/14 1609 09/26/14 0412 09/26/14 0509 09/26/14 1713 09/27/14 0430  NA 137  < > 136  < > 134* 133* 128* 131* 135 136 138  K 4.0  < > 4.3  < > 4.1 4.0 4.0 4.2 3.5 3.7 3.6  CL 98  < > 98  < > 96 92* 95* 90* 88* 89* 90*  CO2 31  --  29  --  32 32  --  38*  --   --  39*  GLUCOSE 115*  < > 136*  < > 64* 115* 112* 487* 171* 128* 105*  BUN 13  < > 18  < > 30* 41* 37* 35* 37* 32* 27*  CREATININE 0.91  < > 1.03  < > 1.13 1.22 1.60* 1.11 1.30 1.10 0.97  CALCIUM 8.8  --  8.4  --  8.4 8.5  --  8.1*  --   --  8.2*  MG 2.5  < > 2.6*  --  2.2 2.2  --  2.3  --   --  1.9  PHOS 3.6  --  4.4  --  2.5 3.6  --  4.5  --   --   --   < > = values in this interval not displayed.  Liver Function Tests:  Recent Labs Lab 09/23/14 0400 09/24/14 0445 09/25/14 0410 09/26/14 0412 09/27/14 0430  AST 246* 170* 101* 97* 81*  ALT 168* 162* 135* 120* 96*  ALKPHOS 124* 123* 136* 155* 172*  BILITOT 3.4* 2.2* 1.6* 1.3* 1.5*  PROT 6.3 5.7* 5.2* 6.0 5.7*  ALBUMIN 3.0* 2.5* 2.4* 2.4* 2.2*   No results for input(s): LIPASE, AMYLASE in the last 168 hours. No results for input(s): AMMONIA in the last 168 hours.  CBC:  Recent Labs Lab 09/23/14 0400  09/24/14 0445 09/25/14 0410 09/25/14 1609 09/26/14 0412 09/26/14 0509 09/26/14 1713 09/27/14 0430  WBC 15.8*  --  14.8* 13.1*  --   11.4*  --   --  15.4*  NEUTROABS 13.9*  --  12.8* 11.0*  --  9.4*  --   --  12.5*  HGB 8.8*  < > 8.0* 7.9* 9.5* 8.2* 10.5* 10.5* 9.3*  HCT 28.5*  < > 25.7* 25.4* 28.0* 28.1* 31.0* 31.0* 31.2*  MCV 90.5  --  88.6 88.8  --  94.6  --   --  90.2  PLT 181  --  183 224  --  280  --   --  405*  < > = values in this interval not displayed.  INR:  Recent Labs Lab 09/23/14 0400 09/24/14 0445 09/25/14 0410 09/26/14 0412 09/27/14 0430  INR 1.44 1.60* 1.84* 2.02* 1.81*    Other results:  EKG:   Imaging: Dg Chest Port 1 View  09/27/2014   CLINICAL DATA:  Left ventricular assist device, chronic CHF, elevated white blood cell count.  EXAM: PORTABLE CHEST - 1 VIEW  COMPARISON:  Portable chest x-ray of September 26, 2014  FINDINGS: The cardiopericardial silhouette remains enlarged. A small portion of the left ventricular assist device is visible over the left heart. The pulmonary interstitial markings are mildly prominent but less conspicuous than on yesterday's study. There remain small bilateral pleural effusions with pleural fluid in the minor fissure. The PICC line tip projects over the cavoatrial junction on the right. There are 7 intact sternal wires.  IMPRESSION: 1. Slight interval improvement in the appearance of the pulmonary interstitium bilaterally. There are persistent bilateral pleural effusions. 2. Persistent enlargement of cardiac silhouette. The left ventricular device is present. The pulmonary vascularity is not significantly engorged. 3. Given the findings on the CT scan of March 17th in the right lower hemi thorax suggesting possible empyema, follow-up chest CT scanning would be useful.   Electronically Signed   By: David  Martinique   On: 09/27/2014 08:07   Dg Chest Port 1 View  09/26/2014   CLINICAL DATA:  Left ventricular assist device. Reported history of pulmonary infarct.  EXAM: PORTABLE CHEST - 1 VIEW  COMPARISON:  09/25/2014  FINDINGS: Right-sided PICC line in place with tip over the  right atrium. Left ventricular assist device partly visualized. Evidence of median sternotomy again noted. Moderate enlargement of the cardiac silhouette is reidentified. Left sided chest tube has been removed. Slight increase in hazy patchy left mid lung zone and retrocardiac airspace opacities. Peripheral right lower lobe airspace opacity is increased in density since previously.  IMPRESSION: Increased patchy left mid lung zone airspace opacity and retrocardiac opacity which could indicate contusion after removal of left-sided chest tube.  Increased density of right lower lobe airspace opacity compatible with reported pulmonary infarct.   Electronically Signed   By: Conchita Paris M.D.   On: 09/26/2014 08:52   Dg Chest Port 1 View  09/25/2014   CLINICAL DATA:  Cardiomyopathy.  Recent pneumonia  EXAM: PORTABLE CHEST - 1 VIEW  COMPARISON:  September 24, 2014  FINDINGS: Right chest tube has been removed. Left chest tube remains. Peripherally inserted central catheter tip is in the superior vena cava. Cordis tip is in the superior vena cava. No pneumothorax. There is persistent consolidation in the right lung base as well as behind the left heart. There is stable cardiomegaly. There is a left ventricular assist device present, incompletely visualized on the current examination. No adenopathy.  IMPRESSION: Tube and catheter positions as described without pneumothorax. Bibasilar consolidation, stable. No change in cardiomegaly.   Electronically Signed   By: Lowella Grip III M.D.   On: 09/25/2014 12:52   Dg Abd Portable 1v  09/27/2014   CLINICAL DATA:  Ileus, left ventricular assist device in place.  EXAM: PORTABLE ABDOMEN - 1 VIEW  COMPARISON:  CT scan of the chest, abdomen, and pelvis of September 16, 2014  FINDINGS: There are small gas collections throughout the small bowel. There is a moderate volume of gas within the colon. There is gas in the stomach. There is an inferior vena caval filter present to the right  of L2. A left ventricular assist device projects over the left upper quadrant of the abdomen. There is a thermal probe but likely in the rectum or urinary bladder.  IMPRESSION: There is no evidence of bowel obstruction. A mild generalized ileus is suspected.   Electronically Signed   By: David  Martinique   On: 09/27/2014 08:03     Medications:     Scheduled Medications: . amiodarone  400 mg Oral BID  . antiseptic oral rinse  7 mL Mouth Rinse BID  . aspirin EC  325 mg Oral Daily   Or  . aspirin  324 mg Per Tube Daily  . bisacodyl  10 mg Oral Daily   Or  . bisacodyl  10 mg Rectal Daily  . cefTAZidime (FORTAZ)  IV  1 g Intravenous 3 times per day  . docusate sodium  200 mg Oral Daily  . ferrous [id number] C-folic acid  1 capsule Oral TID PC  . [START ON 09/28/2014] furosemide  40 mg Intravenous Daily  . gabapentin  300 mg Oral BID  . insulin aspart  0-24 Units Subcutaneous 6 times per day  . levalbuterol  1.25 mg Nebulization Q6H  . losartan  12.5 mg Oral Daily  . metoCLOPramide (REGLAN) injection  10 mg Intravenous 4 times per day  . metolazone  5 mg Oral Daily  . pantoprazole (PROTONIX) IV  40 mg Intravenous Daily  . potassium chloride  10 mEq Intravenous Q1 Hr x 3  . potassium chloride  10 mEq Intravenous Q1 Hr x 2  . rosuvastatin  10 mg Oral q1800  . sodium chloride  10-40 mL Intracatheter Q12H  . traZODone  50 mg Oral QHS  . warfarin  5 mg Oral q1800  . Warfarin - Physician Dosing Inpatient   Does not apply q1800    Infusions: . sodium chloride Stopped (09/24/14 1900)  . sodium chloride    . sodium chloride 20 mL/hr  at 09/23/14 1900  . lactated ringers 10 mL/hr at 09/26/14 1800  . lactated ringers    . milrinone 0.125 mcg/kg/min (09/26/14 1800)    PRN Medications: sodium chloride, ondansetron (ZOFRAN) IV, sodium chloride, traMADol   Assessment:  Stable hemodynamics- no PI events, VAD speed 9400 C02 improved off BiPAP Wt still up- cont bid lasix  Continue   Leave  only pocket drain  Plan/Discussion:    DC foley-  Change iv lasix each am  -Ambulate with physical therapy INR slowly rising, 1.8 on Coumadin 5 mg daily cont low dose milrinone for low  Co-ox Start po neurontin for neuritic pain R hand- prob sternotomy stretch injury  I reviewed the LVAD parameters from today, and compared the results to the patient's prior recorded data.  No programming changes were made.  The LVAD is functioning within specified parameters.  The patient performs LVAD self-test daily.  LVAD interrogation was negative for any significant power changes, alarms or PI events/speed drops.  LVAD equipment check completed and is in good working order.  Back-up equipment present.   LVAD education done on emergency procedures and precautions and reviewed exit site care.  Length of Stay: [place] III 09/27/2014, 8:37 AM

## 2014-09-27 NOTE — Progress Notes (Signed)
ANTICOAGULATION CONSULT NOTE - Initial Consult  Pharmacy Consult for Warfarin Indication: LVAD  No Known Allergies  Patient Measurements: Height: 5\' 8"  (172.7 cm) Weight: 187 lb 2.7 oz (84.9 kg) IBW/kg (Calculated) : 68.4  Vital Signs: Temp: 98.3 F (36.8 C) (03/28 0850) Temp Source: Oral (03/28 0850) Pulse Rate: 95 (03/28 0700)  Labs:  Recent Labs  09/25/14 0410  09/26/14 0412 09/26/14 0509 09/26/14 1713 09/27/14 0430  HGB 7.9*  < > 8.2* 10.5* 10.5* 9.3*  HCT 25.4*  < > 28.1* 31.0* 31.0* 31.2*  PLT 224  --  280  --   --  405*  LABPROT 21.5*  --  23.0*  --   --  21.2*  INR 1.84*  --  2.02*  --   --  1.81*  CREATININE 1.22  < > 1.11 1.30 1.10 0.97  < > = values in this interval not displayed.  Estimated Creatinine Clearance: 79.5 mL/min (by C-G formula based on Cr of 0.97).   Medical History: Past Medical History  Diagnosis Date  . Hyperlipidemia   . Hypertension   . Diabetes   . Nephrolithiasis   . Ankylosing spondylitis   . CAD (coronary artery disease)     a. stenting of LCx 2013; b. STEMI 06/12/14 s/p PCI to LAD complicated by post cath shock requiring IABP; VT s/p DCCV, EF 20%; c. NSTEMI 06/26/14 treated medically.  . Ischemic cardiomyopathy     a. echo 08/23/2014 EF <20%, dilated CM, mod MR/TR  . Acute on chronic respiratory failure     a. 08/2014 in setting of PE.  . Bilateral pulmonary embolism     a. 08/2014 - started on Coumadin. Retrievable IVC filter placed 08/27/14 due to RV strain and large clot burden.  . Right leg DVT     a. 08/2014.  Marland Kitchen Chronic systolic CHF (congestive heart failure)   . Hypotension   . Hemoptysis     a. 08/2014 possibly due to pulm infarct/PE.  Marland Kitchen Pleural effusion on right 08/2014 - small  . Leukocytosis   . Carotid artery disease     a. s/p stenting.  . Ventricular tachycardia     a. 06/2014 at time of MI, s/p DCCV.  Marland Kitchen Reactive thrombocytosis 09/10/2014  . Leukocytosis 09/10/2014   Assessment: 69yo male on warf PTA recent  bilat PE and RLE DVT, s/p IVC filter (08/2014), admitted 3/11 with low output HF, LVAD placed 3/22, Pharmacy consulted to dose warfarin 2/28  PMH: CAD s/p LAD stent in 2013, DM, carotid stenting, ankylosing spondylitis, bilateral PE(08/2014), IVC filter  Coag/heme: LVADHx PE, IVC filter, Warfarin/plavix stopped 3/17, warf restarted 3/23, INR labile, now low, h/h trending up, CT output down significantly, amio dose titrating, on PO antibiotics On oral iron, PTA Warfarin 2mg  Mon and Fri, 4mg  all other days  Goal of Therapy:  INR 2-3 Monitor platelets by anticoagulation protocol: Yes   Plan:  Warfarin 7.5 mg PO today Daily INR   Thank you for allowing pharmacy to be a part of this patients care team.  Rowe Robert Pharm.D., BCPS, AQ-Cardiology Clinical Pharmacist 09/27/2014 11:11 AM Pager: [phone] Phone: [phone]

## 2014-09-27 NOTE — Progress Notes (Signed)
LVAD Exit Site:  VAD dressing removed and site care performed using sterile technique. Drive line exit site cleaned with Chlora prep applicators x 2, allowed to dry, and gauze/tape dressing with aquacel strip re-applied. Exit site appears to be healing well and without drainage, erythema, tenderness, odor and warmth. The velour is fully implanted at exit site. Drive line anchor was changed yesterday and remains intact and in good position. Driveline dressing is being changed daily per sterile technique.  Plan will be to start teaching his wife and alternative caregiver sterile technique and dressing changes this Friday. Encouraged wife and nursing staff to assist with learning and allow her to observe as much as she can until our formal training session.

## 2014-09-27 NOTE — Progress Notes (Signed)
HeartMate 2 Rounding Note  Subjective:    Post Day 6 - HMII LVAD   Walking the unit. Feels ok. CVP 1-2.  Occasional PI events.   LVAD INTERROGATION:  HeartMate II LVAD:  Flow 5.0 liters/min, speed 9400 power 5.7, PI 5.1   2 PI events  Objective:    Vital Signs:   Temp:  [96.1 F (35.6 C)-98.3 F (36.8 C)] 98.3 F (36.8 C) (03/28 0850) Pulse Rate:  [34-97] 95 (03/28 0700) Resp:  [18-39] 36 (03/28 0700) SpO2:  [95 %-100 %] 100 % (03/28 0700) Weight:  [187 lb 2.7 oz (84.9 kg)-190 lb 0.6 oz (86.2 kg)] 187 lb 2.7 oz (84.9 kg) (03/28 0800) Last BM Date: 09/26/14 Mean arterial Pressure  75-80  Intake/Output:   Intake/Output Summary (Last 24 hours) at 09/27/14 0850 Last data filed at 09/27/14 0700  Gross per 24 hour  Intake 1234.79 ml  Output   4355 ml  Net -3120.21 ml     Physical Exam: General:  Awake, sitting in chair HEENT: normal Neck: supple. Carotids 2+ bilat; no bruits. No lymphadenopathy or thryomegaly appreciated.  Cor: Mechanical heart sounds with LVAD hum present. Sternal dressing intact.   Lungs: clear Abdomen: soft, nontender, nondistended. No hepatosplenomegaly. No bruits or masses. Good bowel sounds. Driveline: Dressing looks good.  Extremities: no cyanosis, clubbing, rash, no edema Neuro: Awake follows commands  Telemetry: NSR 80-90s   Labs: Basic Metabolic Panel:  Recent Labs Lab 09/22/14 0300  09/23/14 0400  09/24/14 0445 09/25/14 0410 09/25/14 1609 09/26/14 0412 09/26/14 0509 09/26/14 1713 09/27/14 0430  NA 137  < > 136  < > 134* 133* 128* 131* 135 136 138  K 4.0  < > 4.3  < > 4.1 4.0 4.0 4.2 3.5 3.7 3.6  CL 98  < > 98  < > 96 92* 95* 90* 88* 89* 90*  CO2 31  --  29  --  32 32  --  38*  --   --  39*  GLUCOSE 115*  < > 136*  < > 64* 115* 112* 487* 171* 128* 105*  BUN 13  < > 18  < > 30* 41* 37* 35* 37* 32* 27*  CREATININE 0.91  < > 1.03  < > 1.13 1.22 1.60* 1.11 1.30 1.10 0.97  CALCIUM 8.8  --  8.4  --  8.4 8.5  --  8.1*  --   --  8.2*   MG 2.5  < > 2.6*  --  2.2 2.2  --  2.3  --   --  1.9  PHOS 3.6  --  4.4  --  2.5 3.6  --  4.5  --   --   --   < > = values in this interval not displayed.  Liver Function Tests:  Recent Labs Lab 09/23/14 0400 09/24/14 0445 09/25/14 0410 09/26/14 0412 09/27/14 0430  AST 246* 170* 101* 97* 81*  ALT 168* 162* 135* 120* 96*  ALKPHOS 124* 123* 136* 155* 172*  BILITOT 3.4* 2.2* 1.6* 1.3* 1.5*  PROT 6.3 5.7* 5.2* 6.0 5.7*  ALBUMIN 3.0* 2.5* 2.4* 2.4* 2.2*   No results for input(s): LIPASE, AMYLASE in the last 168 hours. No results for input(s): AMMONIA in the last 168 hours.  CBC:  Recent Labs Lab 09/23/14 0400  09/24/14 0445 09/25/14 0410 09/25/14 1609 09/26/14 0412 09/26/14 0509 09/26/14 1713 09/27/14 0430  WBC 15.8*  --  14.8* 13.1*  --  11.4*  --   --  15.4*  NEUTROABS 13.9*  --  12.8* 11.0*  --  9.4*  --   --  12.5*  HGB 8.8*  < > 8.0* 7.9* 9.5* 8.2* 10.5* 10.5* 9.3*  HCT 28.5*  < > 25.7* 25.4* 28.0* 28.1* 31.0* 31.0* 31.2*  MCV 90.5  --  88.6 88.8  --  94.6  --   --  90.2  PLT 181  --  183 224  --  280  --   --  405*  < > = values in this interval not displayed.  INR:  Recent Labs Lab 09/23/14 0400 09/24/14 0445 09/25/14 0410 09/26/14 0412 09/27/14 0430  INR 1.44 1.60* 1.84* 2.02* 1.81*    Other results:    Imaging: Dg Chest Port 1 View  09/27/2014   CLINICAL DATA:  Left ventricular assist device, chronic CHF, elevated white blood cell count.  EXAM: PORTABLE CHEST - 1 VIEW  COMPARISON:  Portable chest x-ray of September 26, 2014  FINDINGS: The cardiopericardial silhouette remains enlarged. A small portion of the left ventricular assist device is visible over the left heart. The pulmonary interstitial markings are mildly prominent but less conspicuous than on yesterday's study. There remain small bilateral pleural effusions with pleural fluid in the minor fissure. The PICC line tip projects over the cavoatrial junction on the right. There are 7 intact sternal  wires.  IMPRESSION: 1. Slight interval improvement in the appearance of the pulmonary interstitium bilaterally. There are persistent bilateral pleural effusions. 2. Persistent enlargement of cardiac silhouette. The left ventricular device is present. The pulmonary vascularity is not significantly engorged. 3. Given the findings on the CT scan of March 17th in the right lower hemi thorax suggesting possible empyema, follow-up chest CT scanning would be useful.   Electronically Signed   By: David  Martinique   On: 09/27/2014 08:07   Dg Chest Port 1 View  09/26/2014   CLINICAL DATA:  Left ventricular assist device. Reported history of pulmonary infarct.  EXAM: PORTABLE CHEST - 1 VIEW  COMPARISON:  09/25/2014  FINDINGS: Right-sided PICC line in place with tip over the right atrium. Left ventricular assist device partly visualized. Evidence of median sternotomy again noted. Moderate enlargement of the cardiac silhouette is reidentified. Left sided chest tube has been removed. Slight increase in hazy patchy left mid lung zone and retrocardiac airspace opacities. Peripheral right lower lobe airspace opacity is increased in density since previously.  IMPRESSION: Increased patchy left mid lung zone airspace opacity and retrocardiac opacity which could indicate contusion after removal of left-sided chest tube.  Increased density of right lower lobe airspace opacity compatible with reported pulmonary infarct.   Electronically Signed   By: Conchita Paris M.D.   On: 09/26/2014 08:52   Dg Chest Port 1 View  09/25/2014   CLINICAL DATA:  Cardiomyopathy.  Recent pneumonia  EXAM: PORTABLE CHEST - 1 VIEW  COMPARISON:  September 24, 2014  FINDINGS: Right chest tube has been removed. Left chest tube remains. Peripherally inserted central catheter tip is in the superior vena cava. Cordis tip is in the superior vena cava. No pneumothorax. There is persistent consolidation in the right lung base as well as behind the left heart. There is  stable cardiomegaly. There is a left ventricular assist device present, incompletely visualized on the current examination. No adenopathy.  IMPRESSION: Tube and catheter positions as described without pneumothorax. Bibasilar consolidation, stable. No change in cardiomegaly.   Electronically Signed   By: Lowella Grip III M.D.   On:  09/25/2014 12:52   Dg Abd Portable 1v  09/27/2014   CLINICAL DATA:  Ileus, left ventricular assist device in place.  EXAM: PORTABLE ABDOMEN - 1 VIEW  COMPARISON:  CT scan of the chest, abdomen, and pelvis of September 16, 2014  FINDINGS: There are small gas collections throughout the small bowel. There is a moderate volume of gas within the colon. There is gas in the stomach. There is an inferior vena caval filter present to the right of L2. A left ventricular assist device projects over the left upper quadrant of the abdomen. There is a thermal probe but likely in the rectum or urinary bladder.  IMPRESSION: There is no evidence of bowel obstruction. A mild generalized ileus is suspected.   Electronically Signed   By: David  Martinique   On: 09/27/2014 08:03     Medications:     Scheduled Medications: . amiodarone  400 mg Oral BID  . amoxicillin-clavulanate  1 tablet Oral Q12H  . antiseptic oral rinse  7 mL Mouth Rinse BID  . aspirin EC  325 mg Oral Daily   Or  . aspirin  324 mg Per Tube Daily  . bisacodyl  10 mg Oral Daily   Or  . bisacodyl  10 mg Rectal Daily  . docusate sodium  200 mg Oral Daily  . ferrous [id number] C-folic acid  1 capsule Oral TID PC  . [START ON 09/28/2014] furosemide  40 mg Intravenous Daily  . gabapentin  300 mg Oral BID  . insulin aspart  0-24 Units Subcutaneous 6 times per day  . levalbuterol  1.25 mg Nebulization Q6H  . losartan  12.5 mg Oral Daily  . metoCLOPramide (REGLAN) injection  10 mg Intravenous 4 times per day  . metolazone  5 mg Oral Daily  . pantoprazole (PROTONIX) IV  40 mg Intravenous Daily  . potassium chloride   10 mEq Intravenous Q1 Hr x 3  . potassium chloride  10 mEq Intravenous Q1 Hr x 2  . rosuvastatin  10 mg Oral q1800  . sodium chloride  10-40 mL Intracatheter Q12H  . traZODone  50 mg Oral QHS  . warfarin  5 mg Oral q1800  . Warfarin - Physician Dosing Inpatient   Does not apply q1800    Infusions: . sodium chloride Stopped (09/24/14 1900)  . sodium chloride    . sodium chloride 20 mL/hr at 09/23/14 1900  . lactated ringers 10 mL/hr at 09/26/14 1800  . lactated ringers    . milrinone 0.125 mcg/kg/min (09/26/14 1800)    PRN Medications: sodium chloride, ondansetron (ZOFRAN) IV, sodium chloride, traMADol   Assessment:  1. S/P HMII LVAD 09/21/14  2.  A/C systolic HF class IV with cardiogenic shock- EF 15% with moderate RV dysfunction 3/16 echo.  3.  CAD s/p Anterior STEMI on 06/12/14 with stenting of LAD- on coumadin + plavix, statin.  4.  06/12/14 VT in setting of STEMI 5.  Ankylosing spondylitis. 6. Bilateral PE with R DVT- S/P IVC filter 08/27/2014--->coumadin 7.  Asystole noted from Life Vest 8.  LV thrombus 9.  Full Code 10. Hemoptysis, resolved 11. A/C renal failure, resolved 12. Pleural effusion, transudative  13. ?PNA 14. Acute Blood Loss- Anemia- Received 1UPRBCs 3/23  Plan/Discussion:   POD #6 HMII LVAD.   Doing  well. Walking the unit. Volume status down. Co-ox good. Will hold lasix. Stop milrinone.   WBC climbing. Check UA and Ucx. Remove Foley.   Back on amio for recurrent AF. Now  in NSR. Switch amio to po  On coumadin INR 1.8. Will adjust. Will no restart Plavix as anterior wall completely necrotic.   Hopefully to 2W tomorrow.  I reviewed the LVAD parameters from today, and compared the results to the patient's prior recorded data.  No programming changes were made.  The LVAD is functioning within specified parameters.  The patient performs LVAD self-test daily.  LVAD interrogation was negative for any significant power changes, alarms or PI events/speed  drops.  LVAD equipment check completed and is in good working order.  Back-up equipment present.   LVAD education done on emergency procedures and precautions and reviewed exit site care.  Length of Stay: 17  [name]  MD  09/27/2014, 8:50 AM  VAD Team --- VAD ISSUES ONLY--- Pager [phone] (7am - 7am)  Advanced Heart Failure Team  Pager [phone] (M-F; 7a - 4p)  Please contact [facility] for night-coverage after hours (4p -7a ) and weekends on [email or web address]

## 2014-09-27 NOTE — Progress Notes (Signed)
Mr. Lader is lying in bed but has already been walking in the hallway this morning according to wife at bedside. He is having trouble sleeping and this is not a new problem for him but has been worse while hospitalized - I agree with trazodone for sleep. They tell me that he has had pain/swelling over the weekend in his right hand and it was blue in appearance but this is much improved today - appears slightly swollen but color appears to be normal and he moves with no deficits. I will continue to follow and offer support.  Vinie Sill, NP Palliative Medicine Team Pager # [phone] (M-F 8a-5p) Team Phone # [phone] (Nights/Weekends)

## 2014-09-27 NOTE — Progress Notes (Signed)
Physical Therapy Treatment Patient Details Name: Scott [name] MRN: [id number] DOB: 1945-08-08 Today's Date: 09/27/2014    History of Present Illness 69 y.o. M with extensive cardiac hx as well as recent PE / DVT, admitted for SOB due to heart failure, cardiogenic, shock, PE. Thought to be good candidate for VAD (to be evaluated by VAD team 3/17). Found to have mod - large R pleural effusion. LVAD implantation 3/23    PT Comments    Pt progressing very well with decreased lines and improved activity tolerance. Pt able to recall all sternal precautions but requires cues to maintain them. Pt able to transfer power sources without physical assist and min cues for safety of lines and positioning a little confusion regarding which lines to connect. Pt with decreased dexterity right hand related to decreased circulation over the weekend posing increased challenge with power change. Pt with total assist to don vest and put batteries in vest. Encouraged continued mobility with nursing and educated for LVAD equipment, back up bag and self-test.  Follow Up Recommendations  Home health PT;Supervision/Assistance - 24 hour     Equipment Recommendations  Rolling walker with 5" wheels (rollator)    Recommendations for Other Services       Precautions / Restrictions Precautions Precautions: Sternal;Fall Precaution Comments: LVAD, Chest tube    Mobility  Bed Mobility Overal bed mobility: Needs Assistance Bed Mobility: Supine to Sit     Supine to sit: HOB elevated;Min assist     General bed mobility comments: cues for sequence with assist to elevate trunk and scoot to EOB without using bil UE  Transfers Overall transfer level: Needs assistance     Sit to Stand: Min assist         General transfer comment: cues for hand placement, sequence and pivot chair<>BSC with HHA. Sit to stand x3  Ambulation/Gait Ambulation/Gait assistance: Min assist Ambulation Distance (Feet): 150  Feet Assistive device: Rolling walker (2 wheeled) Gait Pattern/deviations: Step-through pattern;Decreased stride length     General Gait Details: pt with great improvement of mobility with decreased lines and ability to tolerate gait with sats 88-98% on 6L throughout. Cues for posture, looking up and position in RW   Stairs            Wheelchair Mobility    Modified Rankin (Stroke Patients Only)       Balance                                    Cognition Arousal/Alertness: Awake/alert Behavior During Therapy: WFL for tasks assessed/performed Overall Cognitive Status: Within Functional Limits for tasks assessed       Memory: Decreased short-term memory              Exercises General Exercises - Lower Extremity Long Arc Quad: AROM;Seated;Both;10 reps    General Comments        Pertinent Vitals/Pain Pain Assessment: No/denies pain  sats 88-98% on 6L 96% on 4L at rest    Home Living                      Prior Function            PT Goals (current goals can now be found in the care plan section) Progress towards PT goals: Progressing toward goals    Frequency       PT Plan Current plan remains appropriate  Co-evaluation             End of Session Equipment Utilized During Treatment: Oxygen Activity Tolerance: Patient tolerated treatment well Patient left: in chair;with call bell/phone within reach;with family/visitor present;with nursing/sitter in room     Time: [phone] PT Time Calculation (min) (ACUTE ONLY): 33 min  Charges:  $Gait Training: 8-22 mins $Therapeutic Activity: 8-22 mins                    G Codes:      Melford Aase Oct 20, 2014, 10:28 AM Elwyn Reach, [place]

## 2014-09-27 NOTE — Progress Notes (Signed)
Pt was returning to bed from bedside commode.  This RN and another RN with pt to assist.  Pt appeared to pass out while standing at bedside with the two RNs at bedside.  RNs assisted pt back to bed and pt returned to baseline.  Pt was able to answer all orientation questions appropriately.  Pt did not report any adverse effects.  Will continue to monitor pt closely.

## 2014-09-28 ENCOUNTER — Inpatient Hospital Stay (HOSPITAL_COMMUNITY): Payer: Medicare Other

## 2014-09-28 DIAGNOSIS — I5023 Acute on chronic systolic (congestive) heart failure: Secondary | ICD-10-CM

## 2014-09-28 DIAGNOSIS — Z95811 Presence of heart assist device: Secondary | ICD-10-CM

## 2014-09-28 LAB — COMPREHENSIVE METABOLIC PANEL
ALT: 77 U/L — ABNORMAL HIGH (ref 0–53)
AST: 64 U/L — ABNORMAL HIGH (ref 0–37)
Albumin: 2.1 g/dL — ABNORMAL LOW (ref 3.5–5.2)
Alkaline Phosphatase: 182 U/L — ABNORMAL HIGH (ref 39–117)
Anion gap: 5 (ref 5–15)
BUN: 19 mg/dL (ref 6–23)
CO2: 39 mmol/L — ABNORMAL HIGH (ref 19–32)
Calcium: 8.4 mg/dL (ref 8.4–10.5)
Chloride: 93 mmol/L — ABNORMAL LOW (ref 96–112)
Creatinine, Ser: 0.81 mg/dL (ref 0.50–1.35)
GFR calc Af Amer: >90 mL/min (ref >90)
GFR calc non Af Amer: >90 mL/min (ref >90)
Glucose, Bld: 98 mg/dL (ref 70–99)
Potassium: 4.1 mmol/L (ref 3.5–5.1)
Sodium: 137 mmol/L (ref 135–145)
Total Bilirubin: 1 mg/dL (ref 0.3–1.2)
Total Protein: 5.8 g/dL — ABNORMAL LOW (ref 6.0–8.3)

## 2014-09-28 LAB — CBC
HEMATOCRIT: 30.8 % — AB (ref 39.0–52.0)
Hemoglobin: 9.2 g/dL — ABNORMAL LOW (ref 13.0–17.0)
MCH: 27.5 pg (ref 26.0–34.0)
MCHC: 29.9 g/dL — ABNORMAL LOW (ref 30.0–36.0)
MCV: 91.9 fL (ref 78.0–100.0)
PLATELETS: 511 10*3/uL — AB (ref 150–400)
RBC: 3.35 MIL/uL — ABNORMAL LOW (ref 4.22–5.81)
RDW: 17.8 % — ABNORMAL HIGH (ref 11.5–15.5)
WBC: 17.2 10*3/uL — AB (ref 4.0–10.5)

## 2014-09-28 LAB — CARBOXYHEMOGLOBIN
Carboxyhemoglobin: 1.4 % (ref 0.5–1.5)
Methemoglobin: 0.7 % (ref 0.0–1.5)
O2 Saturation: 62.3 %
Total hemoglobin: 9.4 g/dL — ABNORMAL LOW (ref 13.5–18.0)

## 2014-09-28 LAB — GLUCOSE, CAPILLARY
GLUCOSE-CAPILLARY: 110 mg/dL — AB (ref 70–99)
GLUCOSE-CAPILLARY: 115 mg/dL — AB (ref 70–99)
GLUCOSE-CAPILLARY: 123 mg/dL — AB (ref 70–99)
GLUCOSE-CAPILLARY: 95 mg/dL (ref 70–99)
Glucose-Capillary: 128 mg/dL — ABNORMAL HIGH (ref 70–99)
Glucose-Capillary: 88 mg/dL (ref 70–99)

## 2014-09-28 LAB — PROTIME-INR
INR: 2.25 — AB (ref 0.00–1.49)
PROTHROMBIN TIME: 25 s — AB (ref 11.6–15.2)

## 2014-09-28 LAB — URINE CULTURE
COLONY COUNT: NO GROWTH
Culture: NO GROWTH

## 2014-09-28 LAB — LACTATE DEHYDROGENASE: LDH: 379 U/L — AB (ref 94–250)

## 2014-09-28 LAB — BRAIN NATRIURETIC PEPTIDE: B Natriuretic Peptide: 845.8 pg/mL — ABNORMAL HIGH (ref 0.0–100.0)

## 2014-09-28 LAB — MAGNESIUM: Magnesium: 2.1 mg/dL (ref 1.5–2.5)

## 2014-09-28 MED ORDER — PIPERACILLIN-TAZOBACTAM 3.375 G IVPB
3.3750 g | Freq: Three times a day (TID) | INTRAVENOUS | Status: DC
  Administered 2014-09-28 – 2014-10-05 (×22): 3.375 g via INTRAVENOUS
  Filled 2014-09-28 (×27): qty 50

## 2014-09-28 MED ORDER — MOVING RIGHT ALONG BOOK
Freq: Once | Status: AC
  Administered 2014-09-28: 14:00:00
  Filled 2014-09-28: qty 1

## 2014-09-28 MED ORDER — MAGNESIUM HYDROXIDE 400 MG/5ML PO SUSP: 30.0000 mL | Freq: Every day | ORAL | Status: DC | PRN

## 2014-09-28 MED ORDER — SODIUM CHLORIDE 0.9 % IJ SOLN: 3.0000 mL | INTRAMUSCULAR | Status: DC | PRN

## 2014-09-28 MED ORDER — WARFARIN SODIUM 2.5 MG PO TABS
2.5000 mg | ORAL_TABLET | Freq: Once | ORAL | Status: AC
  Administered 2014-09-28: 2.5 mg via ORAL
  Filled 2014-09-28: qty 1

## 2014-09-28 MED ORDER — POLYETHYLENE GLYCOL 3350 17 G PO PACK
17.0000 g | PACK | Freq: Every day | ORAL | Status: DC
  Administered 2014-09-29 – 2014-10-01 (×2): 17 g via ORAL
  Filled 2014-09-28 (×8): qty 1

## 2014-09-28 MED ORDER — ALBUMIN HUMAN 5 % IV SOLN
12.5000 g | Freq: Once | INTRAVENOUS | Status: AC
  Administered 2014-09-28: 12.5 g via INTRAVENOUS
  Filled 2014-09-28: qty 250

## 2014-09-28 MED ORDER — INSULIN ASPART 100 UNIT/ML ~~LOC~~ SOLN
0.0000 [IU] | Freq: Three times a day (TID) | SUBCUTANEOUS | Status: DC
  Administered 2014-09-29 – 2014-10-04 (×8): 2 [IU] via SUBCUTANEOUS

## 2014-09-28 MED ORDER — SODIUM CHLORIDE 0.9 % IJ SOLN
3.0000 mL | Freq: Two times a day (BID) | INTRAMUSCULAR | Status: DC
  Administered 2014-10-01 – 2014-10-03 (×4): 3 mL via INTRAVENOUS

## 2014-09-28 MED ORDER — SODIUM CHLORIDE 0.9 % IV SOLN: 250.0000 mL | INTRAVENOUS | Status: DC | PRN

## 2014-09-28 NOTE — Progress Notes (Signed)
Utilization review completed.  

## 2014-09-28 NOTE — Progress Notes (Signed)
Pt transferred to 2W25 on batteries. Report given to RN. LVAD equipment and patient's belongings transferred with pt. Wife present for transfer.  Receiving RN at bedside.

## 2014-09-28 NOTE — Progress Notes (Signed)
Occupational Therapy Treatment Patient Details Name: Scott [name] MRN: [id number] DOB: [birth year] Today's Date: 09/28/2014    History of present illness 69 y.o. M with extensive cardiac hx as well as recent PE / DVT, admitted for SOB due to heart failure, cardiogenic, shock, PE. Thought to be good candidate for VAD (to be evaluated by VAD team 3/17). Found to have mod - large R pleural effusion. LVAD implantation 3/23   OT comments  Pt progressing nicely.  He required min A to switch from battery supply to battery due to Rt hand weakness.  Wife able to switch power supply <>  battery with supervision.   Discussed exercises/theraputty with pt.  For hand strengthening,  But he is resistive due to pain.   Wife to bring in loose comfortable clothing so pt can begin ADL training.    Follow Up Recommendations  Home health OT    Equipment Recommendations  3 in 1 bedside comode    Recommendations for Other Services      Precautions / Restrictions Precautions Precautions: Sternal;Fall Precaution Comments: LVAD, Chest tube       Mobility Bed Mobility Overal bed mobility: Needs Assistance Bed Mobility: Sit to Sidelying         Sit to sidelying: Min assist General bed mobility comments: verbal cues for technique and assist to lift LEs   Transfers Overall transfer level: Needs assistance Equipment used: Rolling walker (2 wheeled) Transfers: Sit to/from [facility] Sit to Stand: Supervision Stand pivot transfers: Min guard       General transfer comment: cues for hand placement.  Ambulated ~175'     Balance Overall balance assessment: Needs assistance Sitting-balance support: Feet supported Sitting balance-Leahy Scale: Good     Standing balance support: Bilateral upper extremity supported Standing balance-Leahy Scale: Fair                     ADL                                         General ADL Comments: Pt unable to  cross ankles over knees for LB ADLs.  Pt able to switch from power source to one battery with min A and increased time cue to Rt hand weakness.  He required min verbal cues for sequence.  Wife able to assist switch other battery and switch battery to power supply with supervision        Vision                     Perception     Praxis      Cognition   Behavior During Therapy: [facility] for tasks assessed/performed Overall Cognitive Status: Within Functional Limits for tasks assessed                       Extremity/Trunk Assessment               Exercises     Shoulder Instructions       General Comments      Pertinent Vitals/ Pain       Pain Assessment: No/denies pain  Home Living  Prior Functioning/Environment              Frequency Min 2X/week     Progress Toward Goals  OT Goals(current goals can now be found in the care plan section)  Progress towards OT goals: Progressing toward goals  ADL Goals Pt Will Perform Grooming: with supervision;standing Pt Will Perform Upper Body Bathing: with supervision;sitting Pt Will Perform Lower Body Bathing: with supervision;sit to/from stand Pt Will Perform Upper Body Dressing: sitting;with supervision Pt Will Perform Lower Body Dressing: with supervision;sit to/from stand Pt Will Transfer to Toilet: with supervision;ambulating Pt Will Perform Toileting - Clothing Manipulation and hygiene: with supervision;sit to/from stand Additional ADL Goal #1: Pt will utilize energy conservation strategies in ADL and mobility independently.  Plan Discharge plan remains appropriate    Co-evaluation                 End of Session Equipment Utilized During Treatment: Oxygen   Activity Tolerance Patient tolerated treatment well   Patient Left in bed;with call bell/phone within reach;with family/visitor present;with nursing/sitter in room   Nurse  Communication Mobility status        Time: 1625-1700 OT Time Calculation (min): 35 min  Charges: OT General Charges $OT Visit: 1 Procedure OT Treatments $Therapeutic Activity: 23-37 mins  Leona Pressly M 09/28/2014, 5:05 PM

## 2014-09-28 NOTE — Progress Notes (Signed)
ANTICOAGULATION CONSULT NOTE - Follow Up Consult  Pharmacy Consult for Warfarin Indication: LVAD  No Known Allergies  Patient Measurements: Height: 5\' 8"  (172.7 cm) Weight: 184 lb (83.462 kg) IBW/kg (Calculated) : 68.4  Vital Signs: Temp: 97.7 F (36.5 C) (03/29 0318) Temp Source: Oral (03/29 0318) Pulse Rate: 77 (03/29 0700)  Labs:  Recent Labs  09/26/14 0412  09/26/14 1713 09/27/14 0430 09/28/14 0530  HGB 8.2*  < > 10.5* 9.3* 9.2*  HCT 28.1*  < > 31.0* 31.2* 30.8*  PLT 280  --   --  405* 511*  LABPROT 23.0*  --   --  21.2* 25.0*  INR 2.02*  --   --  1.81* 2.25*  CREATININE 1.11  < > 1.10 0.97 0.81  < > = values in this interval not displayed.  Estimated Creatinine Clearance: 94.4 mL/min (by C-G formula based on Cr of 0.81).   Medical History: Past Medical History  Diagnosis Date  . Hyperlipidemia   . Hypertension   . Diabetes   . Nephrolithiasis   . Ankylosing spondylitis   . CAD (coronary artery disease)     a. stenting of LCx 2013; b. STEMI 06/12/14 s/p PCI to LAD complicated by post cath shock requiring IABP; VT s/p DCCV, EF 20%; c. NSTEMI 06/26/14 treated medically.  . Ischemic cardiomyopathy     a. echo 08/23/2014 EF <20%, dilated CM, mod MR/TR  . Acute on chronic respiratory failure     a. 08/2014 in setting of PE.  . Bilateral pulmonary embolism     a. 08/2014 - started on Coumadin. Retrievable IVC filter placed 08/27/14 due to RV strain and large clot burden.  . Right leg DVT     a. 08/2014.  Marland Kitchen Chronic systolic CHF (congestive heart failure)   . Hypotension   . Hemoptysis     a. 08/2014 possibly due to pulm infarct/PE.  Marland Kitchen Pleural effusion on right 08/2014 - small  . Leukocytosis   . Carotid artery disease     a. s/p stenting.  . Ventricular tachycardia     a. 06/2014 at time of MI, s/p DCCV.  Marland Kitchen Reactive thrombocytosis 09/10/2014  . Leukocytosis 09/10/2014   Assessment: 69yo male on warf PTA recent bilat PE and RLE DVT, s/p IVC filter (08/2014),  admitted 3/11 with low output HF, LVAD placed 3/22, Pharmacy consulted to dose warfarin 2/28  PMH: CAD s/p LAD stent in 2013, DM, carotid stenting, ankylosing spondylitis, bilateral PE(08/2014), IVC filter  Coag/heme: LVADHx PE, IVC filter, Warfarin/plavix stopped 3/17, warf restarted 3/23, INR labile, now at goal, h/h low but stable, pocket drain still with output, to change to mini today, amio dose titrating, on PO antibiotics,  LDH 379   On oral iron, PTA Warfarin 2mg  Mon and Fri, 4mg  all other days   Goal of Therapy:  INR 2-3 Monitor platelets by anticoagulation protocol: Yes   Plan:  Warfarin 2.5 mg PO today, will dose cautiously today because pocket drain still draining, anticipate a higher warfarin need post LVAD with improved perfusion. Daily INR  Thank you for allowing pharmacy to be a part of this patients care team.  Rowe Robert Pharm.D., BCPS, AQ-Cardiology Clinical Pharmacist 09/28/2014 8:19 AM Pager: [phone] Phone: [phone]

## 2014-09-28 NOTE — Progress Notes (Signed)
Drive line dressing change done with sterile technique by patients wife. Patients RN in room. Site pink, small amount of light bloody drainage noted Will continue to monitor patient. Myldred Raju, Bettina Gavia RN

## 2014-09-28 NOTE — Progress Notes (Signed)
Physical Therapy Treatment Patient Details Name: Scott [name] MRN: [id number] DOB: [birth year] Today's Date: 09/28/2014    History of Present Illness 69 y.o. M with extensive cardiac hx as well as recent PE / DVT, admitted for SOB due to heart failure, cardiogenic, shock, PE. Thought to be good candidate for VAD (to be evaluated by VAD team 3/17). Found to have mod - large R pleural effusion. LVAD implantation 3/23    PT Comments    Pt reports he slept well but is tired and fatigued today. Pt continues to require cues to state all needed equipment for power transfers as well as mod assist to complete today secondary to pain in Right hand and pt not wanting to perform unscrewing and transfer of power lines. Pt educated for precautions and transfers with encouragement to continue attempting as well as continued mobility. Will continue to follow.    Follow Up Recommendations  Home health PT;Supervision/Assistance - 24 hour     Equipment Recommendations       Recommendations for Other Services       Precautions / Restrictions Precautions Precautions: Sternal;Fall Precaution Comments: LVAD, Chest tube Restrictions Weight Bearing Restrictions: No    Mobility  Bed Mobility               General bed mobility comments: in chair on arrival  Transfers Overall transfer level: Needs assistance     Sit to Stand: Supervision         General transfer comment: cues for hand placement safety and precautions  Ambulation/Gait Ambulation/Gait assistance: Min assist Ambulation Distance (Feet): 150 Feet Assistive device: Rolling walker (2 wheeled) Gait Pattern/deviations: Step-through pattern;Decreased stride length   Gait velocity interpretation: Below normal speed for age/gender General Gait Details: Maintained gait distance today with 2 standing rests with cues for breathing technique. Pt reporting R hand pain with use today and unable to maintain grip on RW today with  hand falling off of RW x 3 during gait despite sensation in hand   Stairs            Wheelchair Mobility    Modified Rankin (Stroke Patients Only)       Balance Overall balance assessment: Needs assistance   Sitting balance-Leahy Scale: Good       Standing balance-Leahy Scale: Fair                      Cognition Arousal/Alertness: Awake/alert Behavior During Therapy: WFL for tasks assessed/performed Overall Cognitive Status: Within Functional Limits for tasks assessed                      Exercises General Exercises - Lower Extremity Long Arc Quad: AROM;Seated;Both;10 reps;20 reps Hip Flexion/Marching: AROM;Both;20 reps;Seated Toe Raises: AROM;Both;20 reps;Seated Heel Raises: AROM;Both;20 reps;Seated    General Comments        Pertinent Vitals/Pain Pain Assessment: No/denies pain  HR 76-86 On 2L throughout No LVAD events with mobility    Home Living                      Prior Function            PT Goals (current goals can now be found in the care plan section) Progress towards PT goals: Progressing toward goals    Frequency       PT Plan Current plan remains appropriate    Co-evaluation  End of Session Equipment Utilized During Treatment: Oxygen Activity Tolerance: Patient tolerated treatment well Patient left: in chair;with call bell/phone within reach;with nursing/sitter in room     Time: 0735-0804 PT Time Calculation (min) (ACUTE ONLY): 29 min  Charges:  $Gait Training: 8-22 mins $Therapeutic Exercise: 8-22 mins                    G Codes:      Melford Aase October 20, 2014, 10:38 AM Elwyn Reach, [place]

## 2014-09-28 NOTE — Progress Notes (Signed)
HeartMate 2 Rounding Note  Subjective:    Post Day 7 - HMII LVAD   Continues to improve. Off milrinone. Co-ox 62% Walking the unit. CVP low. Getting albumin. Right hand still sore and poor grip.   WBC count continue to climb. UA +  Culture pending. Now on IV zosyn. INR 2.2  LVAD INTERROGATION:  HeartMate II LVAD:  Flow 5.0 liters/min, speed 9400 power 5.7, PI 3.1   No PI events. Multiple PI events last night  Objective:    Vital Signs:   Temp:  [96.3 F (35.7 C)-98.1 F (36.7 C)] 96.3 F (35.7 C) (03/29 0700) Pulse Rate:  [34-101] 34 (03/29 0900) Resp:  [20-36] 32 (03/29 0900) SpO2:  [87 %-100 %] 98 % (03/29 0922) Weight:  [83.462 kg (184 lb)] 83.462 kg (184 lb) (03/29 0600) Last BM Date: 09/27/14 Mean arterial Pressure  70-75  Intake/Output:   Intake/Output Summary (Last 24 hours) at 09/28/14 0940 Last data filed at 09/28/14 0800  Gross per 24 hour  Intake   1060 ml  Output   1660 ml  Net   -600 ml     Physical Exam: General:  Awake, sitting in chair HEENT: normal Neck: supple. Carotids 2+ bilat; no bruits. No lymphadenopathy or thryomegaly appreciated.  Cor: Mechanical heart sounds with LVAD hum present.  Lungs: clear Abdomen: soft, nontender, nondistended. No hepatosplenomegaly. No bruits or masses. Good bowel sounds. Driveline: Dressing looks good.  Extremities: no cyanosis, clubbing, rash, no edema R hand weak. R index finger slightly mottled. Sensation intact Neuro: Awake follows commands  Telemetry: NSR 80-90s   Labs: Basic Metabolic Panel:  Recent Labs Lab 09/22/14 0300  09/23/14 0400  09/24/14 0445 09/25/14 0410  09/26/14 0412 09/26/14 0509 09/26/14 1713 09/27/14 0430 09/28/14 0530  NA 137  < > 136  < > 134* 133*  < > 131* 135 136 138 137  K 4.0  < > 4.3  < > 4.1 4.0  < > 4.2 3.5 3.7 3.6 4.1  CL 98  < > 98  < > 96 92*  < > 90* 88* 89* 90* 93*  CO2 31  --  29  --  32 32  --  38*  --   --  39* 39*  GLUCOSE 115*  < > 136*  < > 64* 115*  < >  487* 171* 128* 105* 98  BUN 13  < > 18  < > 30* 41*  < > 35* 37* 32* 27* 19  CREATININE 0.91  < > 1.03  < > 1.13 1.22  < > 1.11 1.30 1.10 0.97 0.81  CALCIUM 8.8  --  8.4  --  8.4 8.5  --  8.1*  --   --  8.2* 8.4  MG 2.5  < > 2.6*  --  2.2 2.2  --  2.3  --   --  1.9 2.1  PHOS 3.6  --  4.4  --  2.5 3.6  --  4.5  --   --   --   --   < > = values in this interval not displayed.  Liver Function Tests:  Recent Labs Lab 09/24/14 0445 09/25/14 0410 09/26/14 0412 09/27/14 0430 09/28/14 0530  AST 170* 101* 97* 81* 64*  ALT 162* 135* 120* 96* 77*  ALKPHOS 123* 136* 155* 172* 182*  BILITOT 2.2* 1.6* 1.3* 1.5* 1.0  PROT 5.7* 5.2* 6.0 5.7* 5.8*  ALBUMIN 2.5* 2.4* 2.4* 2.2* 2.1*   No results for input(s):  LIPASE, AMYLASE in the last 168 hours. No results for input(s): AMMONIA in the last 168 hours.  CBC:  Recent Labs Lab 09/23/14 0400  09/24/14 0445 09/25/14 0410  09/26/14 0412 09/26/14 0509 09/26/14 1713 09/27/14 0430 09/28/14 0530  WBC 15.8*  --  14.8* 13.1*  --  11.4*  --   --  15.4* 17.2*  NEUTROABS 13.9*  --  12.8* 11.0*  --  9.4*  --   --  12.5*  --   HGB 8.8*  < > 8.0* 7.9*  < > 8.2* 10.5* 10.5* 9.3* 9.2*  HCT 28.5*  < > 25.7* 25.4*  < > 28.1* 31.0* 31.0* 31.2* 30.8*  MCV 90.5  --  88.6 88.8  --  94.6  --   --  90.2 91.9  PLT 181  --  183 224  --  280  --   --  405* 511*  < > = values in this interval not displayed.  INR:  Recent Labs Lab 09/24/14 0445 09/25/14 0410 09/26/14 0412 09/27/14 0430 09/28/14 0530  INR 1.60* 1.84* 2.02* 1.81* 2.25*    Other results:    Imaging: Dg Chest 2 View  09/28/2014   CLINICAL DATA:  69 year old male with left ventricular assist device. Initial encounter.  EXAM: CHEST  2 VIEW  COMPARISON:  09/27/2014 and earlier.  FINDINGS: Upright PA and lateral views of the chest today. LVED in place. Stable cardiomegaly and mediastinal contours. Stable right PICC line. No pneumothorax or pulmonary edema. Since 08/31/2014 improved ventilation  at the right lung base. Residual right lung base opacity may in part reflect fluid trapped in the fissure. Small residual bilateral pleural effusions. No areas of worsening ventilation. IVC filter. Ankylosis of the spine.  IMPRESSION: 1. Small bilateral pleural effusions. Confluent right lung base opacity probably a combination of residual airspace disease and pleural fluid in the minor fissure. 2. LVAD in place, no adverse features identified. No new cardiopulmonary abnormality.   Electronically Signed   By: Genevie Ann M.D.   On: 09/28/2014 07:30   Dg Chest Port 1 View  09/27/2014   CLINICAL DATA:  Left ventricular assist device, chronic CHF, elevated white blood cell count.  EXAM: PORTABLE CHEST - 1 VIEW  COMPARISON:  Portable chest x-ray of September 26, 2014  FINDINGS: The cardiopericardial silhouette remains enlarged. A small portion of the left ventricular assist device is visible over the left heart. The pulmonary interstitial markings are mildly prominent but less conspicuous than on yesterday's study. There remain small bilateral pleural effusions with pleural fluid in the minor fissure. The PICC line tip projects over the cavoatrial junction on the right. There are 7 intact sternal wires.  IMPRESSION: 1. Slight interval improvement in the appearance of the pulmonary interstitium bilaterally. There are persistent bilateral pleural effusions. 2. Persistent enlargement of cardiac silhouette. The left ventricular device is present. The pulmonary vascularity is not significantly engorged. 3. Given the findings on the CT scan of March 17th in the right lower hemi thorax suggesting possible empyema, follow-up chest CT scanning would be useful.   Electronically Signed   By: David  Martinique   On: 09/27/2014 08:07   Dg Abd Portable 1v  09/27/2014   CLINICAL DATA:  Ileus, left ventricular assist device in place.  EXAM: PORTABLE ABDOMEN - 1 VIEW  COMPARISON:  CT scan of the chest, abdomen, and pelvis of September 16, 2014   FINDINGS: There are small gas collections throughout the small bowel. There is a moderate volume  of gas within the colon. There is gas in the stomach. There is an inferior vena caval filter present to the right of L2. A left ventricular assist device projects over the left upper quadrant of the abdomen. There is a thermal probe but likely in the rectum or urinary bladder.  IMPRESSION: There is no evidence of bowel obstruction. A mild generalized ileus is suspected.   Electronically Signed   By: David  Martinique   On: 09/27/2014 08:03     Medications:     Scheduled Medications: . albumin human  12.5 g Intravenous Once  . amiodarone  200 mg Oral BID  . aspirin EC  325 mg Oral Daily   Or  . aspirin  324 mg Per Tube Daily  . bisacodyl  10 mg Oral Daily   Or  . bisacodyl  10 mg Rectal Daily  . docusate sodium  200 mg Oral Daily  . ferrous [id number] C-folic acid  1 capsule Oral TID PC  . gabapentin  300 mg Oral BID  . insulin aspart  0-24 Units Subcutaneous 6 times per day  . levalbuterol  1.25 mg Nebulization Q6H  . pantoprazole  40 mg Oral Daily  . piperacillin-tazobactam (ZOSYN)  IV  3.375 g Intravenous 3 times per day  . rosuvastatin  10 mg Oral q1800  . sodium chloride  10-40 mL Intracatheter Q12H  . traZODone  50 mg Oral QHS  . Warfarin - Pharmacist Dosing Inpatient   Does not apply q1800    Infusions: . sodium chloride Stopped (09/24/14 1900)  . sodium chloride    . sodium chloride 20 mL/hr at 09/23/14 1900  . lactated ringers Stopped (09/27/14 0700)  . lactated ringers      PRN Medications: sodium chloride, ondansetron (ZOFRAN) IV, sodium chloride, traMADol   Assessment:  1. S/P HMII LVAD 09/21/14  2.  A/C systolic HF class IV with cardiogenic shock- EF 15% with moderate RV dysfunction 3/16 echo.  3.  CAD s/p Anterior STEMI on 06/12/14 with stenting of LAD- on coumadin + plavix, statin.  4.  06/12/14 VT in setting of STEMI 5.  Ankylosing spondylitis. 6.  Bilateral PE with R DVT- S/P IVC filter 08/27/2014--->coumadin 7.  Asystole noted from Life Vest 8.  LV thrombus 9.  Full Code 10. Hemoptysis, resolved 11. A/C renal failure, resolved 12. Pleural effusion, transudative  13. ?PNA 14. Acute Blood Loss- Anemia- Received 1UPRBCs 3/23  Plan/Discussion:   POD #7 HMII LVAD.   Doing  well. Walking the unit. Volume status down. Co-ox good. Off lasix and milrinone.   WBC climbing. UA + for UTI. On Zosyn. Await cx  Maintaining NSR on amio.   On coumadin INR 2.2  Will not restart Plavix as anterior wall completely necrotic.   To 2W today. Probable ramp echo tomorrow  I reviewed the LVAD parameters from today, and compared the results to the patient's prior recorded data.  No programming changes were made.  The LVAD is functioning within specified parameters.  The patient performs LVAD self-test daily.  LVAD interrogation was negative for any significant power changes, alarms or PI events/speed drops.  LVAD equipment check completed and is in good working order.  Back-up equipment present.   LVAD education done on emergency procedures and precautions and reviewed exit site care.  Length of Stay: 75  [name]  MD  09/28/2014, 9:40 AM  VAD Team --- VAD ISSUES ONLY--- Pager [phone] (7am - 7am)  Advanced Heart Failure Team  Pager  [phone] (M-F; 7a - 4p)  Please contact [facility] for night-coverage after hours (4p -7a ) and weekends on [email or web address]

## 2014-09-28 NOTE — Progress Notes (Signed)
HeartMate 2 Rounding Note  Subjective:   POD #7   VAD implant  (DT)  For acute /chronic systolic HF from ischemic cardiomyopathy and cardiogenic shock on preop IABP  Recent PE- with large R pulmonary infarct,necrotic    lung/pneumonia- poss empyema Restrictive lung disease- FEV1 1.3 from ankylosing spondylitis DM  HX ventricular, atrial arrhythmia Postoperative CO2 retention requiring BiPAP- now resolved  Patient continues to progress- walking Co-ox limproved   off milrinone   Wt down 6 lbs in 2 days- PI low with several PI events- holding lasix and will give one albumin Ileus resolved-  TNA stopped, reglan stopped Maintaining NSR Pocket drain approx 200 cc- will leave in place WBC cont to rise> 17,000  Urine culture pending Will stop augmentin, start iv Zosyn  Pump parameters are satisfactory. PI is  < 4 MAP now 72    INR slowly rising - now 2.2 - coumadin per PharmD   Insomnia treated with xanax last pm but pt drowsy this am- will DC and resume trazodone he took preop 50mg   Q hs   HeartMate II LVAD:  Flow 4.5 liters/min, speed 9400 rpm power 4.1, PI  2.8  Controller intact  Objective:    Vital Signs:   Temp:  [96.3 F (35.7 C)-98.3 F (36.8 C)] 96.3 F (35.7 C) (03/29 0700) Pulse Rate:  [58-101] 77 (03/29 0700) Resp:  [20-36] 21 (03/29 0700) SpO2:  [90 %-100 %] 98 % (03/29 0700) Weight:  [184 lb (83.462 kg)] 184 lb (83.462 kg) (03/29 0600) Last BM Date: 09/27/14 Mean arterial Pressure 75  Intake/Output:   Intake/Output Summary (Last 24 hours) at 09/28/14 0837 Last data filed at 09/28/14 0600  Gross per 24 hour  Intake    870 ml  Output   1740 ml  Net   -870 ml     Physical Exam: General:  Alert on nasal cannula HEENT: normal Neck: supple. JVP . Carotids 2+ bilat; no bruits. No lymphadenopathy or thryomegaly appreciated. Cor: Mechanical heart sounds with LVAD hum present. Lungs: coarse bs on R, moving better air today Abdomen: soft, nontender,  nondistended. No hepatosplenomegaly. No bruits or masses. Good bowel sounds. Driveline: C/D/I; securement device intact and driveline incorporated Extremities: warm well perfused,no cyanosis, clubbing, rash, edema- IABP out without hematoma and groin- c/o pain in R middle finger - prob related to a-line Neuro: moves all extremities   Telemetry:nsr  Labs: Basic Metabolic Panel:  Recent Labs Lab 09/22/14 0300  09/23/14 0400  09/24/14 0445 09/25/14 0410  09/26/14 0412 09/26/14 0509 09/26/14 1713 09/27/14 0430 09/28/14 0530  NA 137  < > 136  < > 134* 133*  < > 131* 135 136 138 137  K 4.0  < > 4.3  < > 4.1 4.0  < > 4.2 3.5 3.7 3.6 4.1  CL 98  < > 98  < > 96 92*  < > 90* 88* 89* 90* 93*  CO2 31  --  29  --  32 32  --  38*  --   --  39* 39*  GLUCOSE 115*  < > 136*  < > 64* 115*  < > 487* 171* 128* 105* 98  BUN 13  < > 18  < > 30* 41*  < > 35* 37* 32* 27* 19  CREATININE 0.91  < > 1.03  < > 1.13 1.22  < > 1.11 1.30 1.10 0.97 0.81  CALCIUM 8.8  --  8.4  --  8.4 8.5  --  8.1*  --   --  8.2* 8.4  MG 2.5  < > 2.6*  --  2.2 2.2  --  2.3  --   --  1.9 2.1  PHOS 3.6  --  4.4  --  2.5 3.6  --  4.5  --   --   --   --   < > = values in this interval not displayed.  Liver Function Tests:  Recent Labs Lab 09/24/14 0445 09/25/14 0410 09/26/14 0412 09/27/14 0430 09/28/14 0530  AST 170* 101* 97* 81* 64*  ALT 162* 135* 120* 96* 77*  ALKPHOS 123* 136* 155* 172* 182*  BILITOT 2.2* 1.6* 1.3* 1.5* 1.0  PROT 5.7* 5.2* 6.0 5.7* 5.8*  ALBUMIN 2.5* 2.4* 2.4* 2.2* 2.1*   No results for input(s): LIPASE, AMYLASE in the last 168 hours. No results for input(s): AMMONIA in the last 168 hours.  CBC:  Recent Labs Lab 09/23/14 0400  09/24/14 0445 09/25/14 0410  09/26/14 0412 09/26/14 0509 09/26/14 1713 09/27/14 0430 09/28/14 0530  WBC 15.8*  --  14.8* 13.1*  --  11.4*  --   --  15.4* 17.2*  NEUTROABS 13.9*  --  12.8* 11.0*  --  9.4*  --   --  12.5*  --   HGB 8.8*  < > 8.0* 7.9*  < > 8.2*  10.5* 10.5* 9.3* 9.2*  HCT 28.5*  < > 25.7* 25.4*  < > 28.1* 31.0* 31.0* 31.2* 30.8*  MCV 90.5  --  88.6 88.8  --  94.6  --   --  90.2 91.9  PLT 181  --  183 224  --  280  --   --  405* 511*  < > = values in this interval not displayed.  INR:  Recent Labs Lab 09/24/14 0445 09/25/14 0410 09/26/14 0412 09/27/14 0430 09/28/14 0530  INR 1.60* 1.84* 2.02* 1.81* 2.25*    Other results:  EKG:   Imaging: Dg Chest 2 View  09/28/2014   CLINICAL DATA:  69 year old male with left ventricular assist device. Initial encounter.  EXAM: CHEST  2 VIEW  COMPARISON:  09/27/2014 and earlier.  FINDINGS: Upright PA and lateral views of the chest today. LVED in place. Stable cardiomegaly and mediastinal contours. Stable right PICC line. No pneumothorax or pulmonary edema. Since 08/31/2014 improved ventilation at the right lung base. Residual right lung base opacity may in part reflect fluid trapped in the fissure. Small residual bilateral pleural effusions. No areas of worsening ventilation. IVC filter. Ankylosis of the spine.  IMPRESSION: 1. Small bilateral pleural effusions. Confluent right lung base opacity probably a combination of residual airspace disease and pleural fluid in the minor fissure. 2. LVAD in place, no adverse features identified. No new cardiopulmonary abnormality.   Electronically Signed   By: Genevie Ann M.D.   On: 09/28/2014 07:30   Dg Chest Port 1 View  09/27/2014   CLINICAL DATA:  Left ventricular assist device, chronic CHF, elevated white blood cell count.  EXAM: PORTABLE CHEST - 1 VIEW  COMPARISON:  Portable chest x-ray of September 26, 2014  FINDINGS: The cardiopericardial silhouette remains enlarged. A small portion of the left ventricular assist device is visible over the left heart. The pulmonary interstitial markings are mildly prominent but less conspicuous than on yesterday's study. There remain small bilateral pleural effusions with pleural fluid in the minor fissure. The PICC line tip  projects over the cavoatrial junction on the right. There are 7 intact sternal wires.  IMPRESSION: 1. Slight interval improvement in the appearance of the pulmonary interstitium bilaterally. There are persistent bilateral pleural effusions. 2. Persistent enlargement of cardiac silhouette. The left ventricular device is present. The pulmonary vascularity is not significantly engorged. 3. Given the findings on the CT scan of March 17th in the right lower hemi thorax suggesting possible empyema, follow-up chest CT scanning would be useful.   Electronically Signed   By: David  Martinique   On: 09/27/2014 08:07   Dg Abd Portable 1v  09/27/2014   CLINICAL DATA:  Ileus, left ventricular assist device in place.  EXAM: PORTABLE ABDOMEN - 1 VIEW  COMPARISON:  CT scan of the chest, abdomen, and pelvis of September 16, 2014  FINDINGS: There are small gas collections throughout the small bowel. There is a moderate volume of gas within the colon. There is gas in the stomach. There is an inferior vena caval filter present to the right of L2. A left ventricular assist device projects over the left upper quadrant of the abdomen. There is a thermal probe but likely in the rectum or urinary bladder.  IMPRESSION: There is no evidence of bowel obstruction. A mild generalized ileus is suspected.   Electronically Signed   By: David  Martinique   On: 09/27/2014 08:03     Medications:     Scheduled Medications: . albumin human  12.5 g Intravenous Once  . amiodarone  200 mg Oral BID  . aspirin EC  325 mg Oral Daily   Or  . aspirin  324 mg Per Tube Daily  . bisacodyl  10 mg Oral Daily   Or  . bisacodyl  10 mg Rectal Daily  . docusate sodium  200 mg Oral Daily  . ferrous [id number] C-folic acid  1 capsule Oral TID PC  . gabapentin  300 mg Oral BID  . insulin aspart  0-24 Units Subcutaneous 6 times per day  . levalbuterol  1.25 mg Nebulization Q6H  . pantoprazole  40 mg Oral Daily  . piperacillin-tazobactam (ZOSYN)  IV   3.375 g Intravenous 3 times per day  . rosuvastatin  10 mg Oral q1800  . sodium chloride  10-40 mL Intracatheter Q12H  . traZODone  50 mg Oral QHS  . Warfarin - Pharmacist Dosing Inpatient   Does not apply q1800    Infusions: . sodium chloride Stopped (09/24/14 1900)  . sodium chloride    . sodium chloride 20 mL/hr at 09/23/14 1900  . lactated ringers Stopped (09/27/14 0700)  . lactated ringers      PRN Medications: sodium chloride, ondansetron (ZOFRAN) IV, sodium chloride, traMADol   Assessment:  Stable hemodynamics- , VAD speed 9400 C02 improved off BiPAP Wt  At baseline but he is dry- hold lasix Continue pocket drain for  Persistent serosanginous drainage   Plan/Discussion:    Transfer to stepdown Follow leukocytosis and cover with Zosyn while waiting for cultures Cont with VAD teaching    I reviewed the LVAD parameters from today, and compared the results to the patient's prior recorded data.  No programming changes were made.  The LVAD is functioning within specified parameters.  The patient performs LVAD self-test daily.  LVAD interrogation was negative for any significant power changes, alarms or PI events/speed drops.  LVAD equipment check completed and is in good working order.  Back-up equipment present.   LVAD education done on emergency procedures and precautions and reviewed exit site care.  Length of Stay: [place] III 09/28/2014, 8:37  AM     

## 2014-09-29 DIAGNOSIS — Z95811 Presence of heart assist device: Secondary | ICD-10-CM

## 2014-09-29 DIAGNOSIS — I5023 Acute on chronic systolic (congestive) heart failure: Secondary | ICD-10-CM

## 2014-09-29 LAB — CBC
HCT: 29.6 % — ABNORMAL LOW (ref 39.0–52.0)
Hemoglobin: 8.7 g/dL — ABNORMAL LOW (ref 13.0–17.0)
MCH: 27.6 pg (ref 26.0–34.0)
MCHC: 29.4 g/dL — ABNORMAL LOW (ref 30.0–36.0)
MCV: 94 fL (ref 78.0–100.0)
Platelets: 576 10*3/uL — ABNORMAL HIGH (ref 150–400)
RBC: 3.15 MIL/uL — ABNORMAL LOW (ref 4.22–5.81)
RDW: 17.7 % — ABNORMAL HIGH (ref 11.5–15.5)
WBC: 15.6 10*3/uL — AB (ref 4.0–10.5)

## 2014-09-29 LAB — GLUCOSE, CAPILLARY
GLUCOSE-CAPILLARY: 109 mg/dL — AB (ref 70–99)
GLUCOSE-CAPILLARY: 99 mg/dL (ref 70–99)
Glucose-Capillary: 127 mg/dL — ABNORMAL HIGH (ref 70–99)
Glucose-Capillary: 140 mg/dL — ABNORMAL HIGH (ref 70–99)

## 2014-09-29 LAB — PROTIME-INR
INR: 2.47 — AB (ref 0.00–1.49)
PROTHROMBIN TIME: 27 s — AB (ref 11.6–15.2)

## 2014-09-29 LAB — LACTATE DEHYDROGENASE: LDH: 350 U/L — AB (ref 94–250)

## 2014-09-29 MED ORDER — ENSURE PO LIQD: 1.0000 | Freq: Three times a day (TID) | ORAL | Status: DC

## 2014-09-29 MED ORDER — DM-GUAIFENESIN ER 30-600 MG PO TB12
1.0000 | ORAL_TABLET | Freq: Two times a day (BID) | ORAL | Status: DC
  Administered 2014-09-29 – 2014-10-04 (×12): 1 via ORAL
  Filled 2014-09-29 (×16): qty 1

## 2014-09-29 MED ORDER — WARFARIN SODIUM 4 MG PO TABS
4.0000 mg | ORAL_TABLET | Freq: Once | ORAL | Status: AC
  Administered 2014-09-29: 4 mg via ORAL
  Filled 2014-09-29: qty 1

## 2014-09-29 MED ORDER — ENSURE ENLIVE PO LIQD
237.0000 mL | Freq: Three times a day (TID) | ORAL | Status: DC
  Administered 2014-10-02: 237 mL via ORAL

## 2014-09-29 NOTE — Progress Notes (Signed)
Occupational Therapy Treatment Patient Details Name: Scott [name] MRN: [id number] DOB: 10-06-1945 Today's Date: 09/29/2014    History of present illness 69 y.o. M with extensive cardiac hx as well as recent PE / DVT, admitted for SOB due to heart failure, cardiogenic, shock, PE. Thought to be good candidate for VAD (to be evaluated by VAD team 3/17). Found to have mod - large R pleural effusion. LVAD implantation 3/23   OT comments  Pt's mobility and independence in ADL and ADL transfers continues to improve.  Instructed in energy conservation.  Pt is able to access his feet to donn socks, but fatigues donning L sock due to needing to bend over.  Prefers to rely on wife rather than AE.  Pt declining theraputty for R hand, reports improvement in function and color.  Follow Up Recommendations  Home health OT    Equipment Recommendations  3 in 1 bedside comode    Recommendations for Other Services      Precautions / Restrictions Precautions Precautions: Sternal;Fall Precaution Comments: LVAD       Mobility Bed Mobility               General bed mobility comments: pt in chair, returned to chair  Transfers   Equipment used: Rolling walker (2 wheeled)   Sit to Stand: Supervision              Balance                                   ADL Overall ADL's : Needs assistance/impaired     Grooming: Wash/dry hands;Wash/dry face;Oral care;Sitting;Supervision/safety (shaving) Grooming Details (indicate cue type and reason): sat on 3 in 1 at sink             Lower Body Dressing: Minimal assistance;Sit to/from stand Lower Body Dressing Details (indicate cue type and reason): pt able to cross R foot over L knee, bends forward to reach L foot  Toilet Transfer: Supervision/safety;RW;BSC;Ambulation (BSC over toilet)   Toileting- Clothing Manipulation and Hygiene: Supervision/safety;Sit to/from stand       Functional mobility during ADLs:  Supervision/safety;Rolling walker General ADL Comments: Continues to demonstrate weakness and decreased sensation on radial side of R hand, but color and functional use is improving. Pt prefers to continue to use R hand in ADL and declines therapy putty.  Pt able to access feet without AD this date.  May rely on wife for LB dressing if he is fatigued.       Vision                     Perception     Praxis      Cognition   Behavior During Therapy: Flat affect Overall Cognitive Status: Within Functional Limits for tasks assessed                       Extremity/Trunk Assessment               Exercises     Shoulder Instructions       General Comments      Pertinent Vitals/ Pain       Pain Assessment: No/denies pain  Home Living  Prior Functioning/Environment              Frequency Min 2X/week     Progress Toward Goals  OT Goals(current goals can now be found in the care plan section)  Progress towards OT goals: Progressing toward goals  Acute Rehab OT Goals Patient Stated Goal: work on cars  Plan Discharge plan remains appropriate    Co-evaluation                 End of Session Equipment Utilized During Treatment: Oxygen   Activity Tolerance Patient tolerated treatment well   Patient Left in chair;with call bell/phone within reach;with family/visitor present   Nurse Communication          Time: 1610-9604 OT Time Calculation (min): 31 min  Charges: OT General Charges $OT Visit: 1 Procedure OT Treatments $Self Care/Home Management : 23-37 mins  [name] 09/29/2014, 4:07 PM [phone]

## 2014-09-29 NOTE — Progress Notes (Signed)
CARDIAC REHAB PHASE I   PRE:  Rate/Rhythm: 74 SR  BP:  Supine:   Sitting: 76 map  Standing:    SaO2: 99% 2.5L  MODE:  Ambulation: 350 ft   POST:  Rate/Rhythm: 82 SR  BP:  Supine:   Sitting: 78 map  Standing:    SaO2: 94% 2L 0926-1023 Pt walked 350 ft on 2L with rollator and asst x 2 with slow steady gait. Cannot grip rollator with right hand. Sat once to rest on rollator. Denied dizziness. Stated tired after walk and right hand sore. Wife connected pt to batteries since pt cannot grip with right hand. He did put batteries in clips and checked for full charge. Wife put batteries in vest correctly. She seems very comfortable with equipment. Also did system check. Left pt to batteries as pt wanted to wash up at sink and sit on rollator as needed. Wife to assist. Notified pt's RN that he is to batteries still.   Graylon Good, RN BSN  09/29/2014 10:19 AM

## 2014-09-29 NOTE — Progress Notes (Signed)
ANTICOAGULATION CONSULT NOTE - Follow Up Consult  Pharmacy Consult for Warfarin Indication: LVAD  No Known Allergies  Patient Measurements: Height: 5\' 8"  (172.7 cm) Weight: 184 lb 8.4 oz (83.7 kg) IBW/kg (Calculated) : 68.4  Vital Signs:    Labs:  Recent Labs  09/26/14 1713 09/27/14 0430 09/28/14 0530 09/29/14 0530  HGB 10.5* 9.3* 9.2* 8.7*  HCT 31.0* 31.2* 30.8* 29.6*  PLT  --  405* 511* 576*  LABPROT  --  21.2* 25.0* 27.0*  INR  --  1.81* 2.25* 2.47*  CREATININE 1.10 0.97 0.81  --     Estimated Creatinine Clearance: 94.5 mL/min (by C-G formula based on Cr of 0.81).   Medical History: Past Medical History  Diagnosis Date  . Hyperlipidemia   . Hypertension   . Diabetes   . Nephrolithiasis   . Ankylosing spondylitis   . CAD (coronary artery disease)     a. stenting of LCx 2013; b. STEMI 06/12/14 s/p PCI to LAD complicated by post cath shock requiring IABP; VT s/p DCCV, EF 20%; c. NSTEMI 06/26/14 treated medically.  . Ischemic cardiomyopathy     a. echo 08/23/2014 EF <20%, dilated CM, mod MR/TR  . Acute on chronic respiratory failure     a. 08/2014 in setting of PE.  . Bilateral pulmonary embolism     a. 08/2014 - started on Coumadin. Retrievable IVC filter placed 08/27/14 due to RV strain and large clot burden.  . Right leg DVT     a. 08/2014.  Marland Kitchen Chronic systolic CHF (congestive heart failure)   . Hypotension   . Hemoptysis     a. 08/2014 possibly due to pulm infarct/PE.  Marland Kitchen Pleural effusion on right 08/2014 - small  . Leukocytosis   . Carotid artery disease     a. s/p stenting.  . Ventricular tachycardia     a. 06/2014 at time of MI, s/p DCCV.  Marland Kitchen Reactive thrombocytosis 09/10/2014  . Leukocytosis 09/10/2014   Assessment: 69yo male on warf PTA recent bilat PE and RLE DVT, s/p IVC filter (08/2014), admitted 3/11 with low output HF, LVAD placed 3/22, Pharmacy consulted to dose warfarin.  PMH: CAD s/p LAD stent in 2013, DM, carotid stenting, ankylosing  spondylitis, bilateral PE(08/2014), IVC filter  Coag/heme: LVAD h/o PE, IVC filter, Warfarin/plavix stopped 3/17, warf restarted 3/23, INR labile, now at goal, H&H low, slight trend down, minimal drainage from incision site, amio dose titrating, LDH trend down 496>>350  Will not resume Plavix as anterior wall completely necrotic.  On oral iron, PTA Warfarin 2mg  Mon and Fri, 4mg  all other days Suspect warfarin needs will likely change d/t new start amio as well as improved perfusion w/ LVAD.  Goal of Therapy:  INR 2-3 Monitor platelets by anticoagulation protocol: Yes   Plan:  Warfarin 4mg  x1 Daily INR, s/sx of bleeding  Thank you for allowing pharmacy to be a part of this patient's care team.  Drucie Opitz, PharmD Clinical Pharmacy Resident Pager: [phone] 09/29/2014 1:36 PM

## 2014-09-29 NOTE — Progress Notes (Signed)
HeartMate 2 Rounding Note  Subjective:    Post Day 8 - HMII LVAD   Continues to improve. Off milrinone. Walking the unit. Denies CP. SOB. Mild drainage at base of sternal incision.   On Zosyn. WBC falling. UA +   INR 2.2  LVAD INTERROGATION:  HeartMate II LVAD:  Flow 6.0 liters/min, speed 9400 power 6.3, PI 4.5   No PI events. Multiple PI events last night  Objective:    Vital Signs:   Temp:  [97.7 F (36.5 C)-98.6 F (37 C)] 98.6 F (37 C) (03/29 2017) Pulse Rate:  [77] 77 (03/29 2017) Resp:  [25] 25 (03/29 1551) SpO2:  [92 %-98 %] 98 % (03/29 1551) Weight:  [83.7 kg (184 lb 8.4 oz)] 83.7 kg (184 lb 8.4 oz) (03/30 0401) Last BM Date: 09/27/14 Mean arterial Pressure  70-80  Intake/Output:   Intake/Output Summary (Last 24 hours) at 09/29/14 1258 Last data filed at 09/29/14 0900  Gross per 24 hour  Intake    720 ml  Output    760 ml  Net    -40 ml     Physical Exam: General:  Awake, sitting in chair HEENT: normal Neck: supple. Carotids 2+ bilat; no bruits. No lymphadenopathy or thryomegaly appreciated.  Cor: Mechanical heart sounds with LVAD hum present.  Lungs: clear Abdomen: soft, nontender, nondistended. No hepatosplenomegaly. No bruits or masses. Good bowel sounds. Driveline: Dressing looks good.  Extremities: no cyanosis, clubbing, rash, no edema R hand weak. R index finger slightly mottled. Sensation intact Neuro: Awake follows commands  Telemetry: NSR 80-90s   Labs: Basic Metabolic Panel:  Recent Labs Lab 09/23/14 0400  09/24/14 0445 09/25/14 0410  09/26/14 0412 09/26/14 0509 09/26/14 1713 09/27/14 0430 09/28/14 0530  NA 136  < > 134* 133*  < > 131* 135 136 138 137  K 4.3  < > 4.1 4.0  < > 4.2 3.5 3.7 3.6 4.1  CL 98  < > 96 92*  < > 90* 88* 89* 90* 93*  CO2 29  --  32 32  --  38*  --   --  39* 39*  GLUCOSE 136*  < > 64* 115*  < > 487* 171* 128* 105* 98  BUN 18  < > 30* 41*  < > 35* 37* 32* 27* 19  CREATININE 1.03  < > 1.13 1.22  < > 1.11 1.30  1.10 0.97 0.81  CALCIUM 8.4  --  8.4 8.5  --  8.1*  --   --  8.2* 8.4  MG 2.6*  --  2.2 2.2  --  2.3  --   --  1.9 2.1  PHOS 4.4  --  2.5 3.6  --  4.5  --   --   --   --   < > = values in this interval not displayed.  Liver Function Tests:  Recent Labs Lab 09/24/14 0445 09/25/14 0410 09/26/14 0412 09/27/14 0430 09/28/14 0530  AST 170* 101* 97* 81* 64*  ALT 162* 135* 120* 96* 77*  ALKPHOS 123* 136* 155* 172* 182*  BILITOT 2.2* 1.6* 1.3* 1.5* 1.0  PROT 5.7* 5.2* 6.0 5.7* 5.8*  ALBUMIN 2.5* 2.4* 2.4* 2.2* 2.1*   No results for input(s): LIPASE, AMYLASE in the last 168 hours. No results for input(s): AMMONIA in the last 168 hours.  CBC:  Recent Labs Lab 09/23/14 0400  09/24/14 0445 09/25/14 0410  09/26/14 0412 09/26/14 0509 09/26/14 1713 09/27/14 0430 09/28/14 0530 09/29/14 0530  WBC  15.8*  --  14.8* 13.1*  --  11.4*  --   --  15.4* 17.2* 15.6*  NEUTROABS 13.9*  --  12.8* 11.0*  --  9.4*  --   --  12.5*  --   --   HGB 8.8*  < > 8.0* 7.9*  < > 8.2* 10.5* 10.5* 9.3* 9.2* 8.7*  HCT 28.5*  < > 25.7* 25.4*  < > 28.1* 31.0* 31.0* 31.2* 30.8* 29.6*  MCV 90.5  --  88.6 88.8  --  94.6  --   --  90.2 91.9 94.0  PLT 181  --  183 224  --  280  --   --  405* 511* 576*  < > = values in this interval not displayed.  INR:  Recent Labs Lab 09/25/14 0410 09/26/14 0412 09/27/14 0430 09/28/14 0530 09/29/14 0530  INR 1.84* 2.02* 1.81* 2.25* 2.47*    Other results:    Imaging: Dg Chest 2 View  09/28/2014   CLINICAL DATA:  69 year old male with left ventricular assist device. Initial encounter.  EXAM: CHEST  2 VIEW  COMPARISON:  09/27/2014 and earlier.  FINDINGS: Upright PA and lateral views of the chest today. LVED in place. Stable cardiomegaly and mediastinal contours. Stable right PICC line. No pneumothorax or pulmonary edema. Since 08/31/2014 improved ventilation at the right lung base. Residual right lung base opacity may in part reflect fluid trapped in the fissure. Small  residual bilateral pleural effusions. No areas of worsening ventilation. IVC filter. Ankylosis of the spine.  IMPRESSION: 1. Small bilateral pleural effusions. Confluent right lung base opacity probably a combination of residual airspace disease and pleural fluid in the minor fissure. 2. LVAD in place, no adverse features identified. No new cardiopulmonary abnormality.   Electronically Signed   By: Genevie Ann M.D.   On: 09/28/2014 07:30     Medications:     Scheduled Medications: . amiodarone  200 mg Oral BID  . aspirin EC  325 mg Oral Daily   Or  . aspirin  324 mg Per Tube Daily  . bisacodyl  10 mg Oral Daily   Or  . bisacodyl  10 mg Rectal Daily  . dextromethorphan-guaiFENesin  1 tablet Oral BID  . docusate sodium  200 mg Oral Daily  . feeding supplement (ENSURE ENLIVE)  237 mL Oral TID BM  . ferrous [id number] C-folic acid  1 capsule Oral TID PC  . gabapentin  300 mg Oral BID  . insulin aspart  0-24 Units Subcutaneous TID AC & HS  . pantoprazole  40 mg Oral Daily  . piperacillin-tazobactam (ZOSYN)  IV  3.375 g Intravenous 3 times per day  . polyethylene glycol  17 g Oral Daily  . rosuvastatin  10 mg Oral q1800  . sodium chloride  10-40 mL Intracatheter Q12H  . sodium chloride  3 mL Intravenous Q12H  . traZODone  50 mg Oral QHS  . Warfarin - Pharmacist Dosing Inpatient   Does not apply q1800    Infusions: . sodium chloride    . sodium chloride 20 mL/hr at 09/23/14 1900    PRN Medications: sodium chloride, magnesium hydroxide, ondansetron (ZOFRAN) IV, sodium chloride, sodium chloride, traMADol   Assessment:  1. S/P HMII LVAD 09/21/14  2.  A/C systolic HF class IV with cardiogenic shock- EF 15% with moderate RV dysfunction 3/16 echo.  3.  CAD s/p Anterior STEMI on 06/12/14 with stenting of LAD- on coumadin + plavix, statin.  4.  06/12/14 VT in  setting of STEMI 5.  Ankylosing spondylitis. 6. Bilateral PE with R DVT- S/P IVC filter 08/27/2014--->coumadin 7.   Asystole noted from Life Vest 8.  LV thrombus 9.  Full Code 10. Hemoptysis, resolved 11. A/C renal failure, resolved 12. Pleural effusion, transudative  13. ?PNA 14. Acute Blood Loss- Anemia- Received 1UPRBCs 3/23  Plan/Discussion:    POD #8 HMII LVAD.   Doing  well. Walking the unit. Volume status down. Co-ox good. Off lasix and milrinone.   WBC climbing. UA + for UTI. On Zosyn. Await cx. Mild drainage at sternotomy site. Reviewed with Dr. [name]. Likely fat necrosis. No signs infection   Maintaining NSR on amio.   On coumadin INR 2.5  Will not restart Plavix as anterior wall completely necrotic.   Ramp echo tomorrow  I reviewed the LVAD parameters from today, and compared the results to the patient's prior recorded data.  No programming changes were made.  The LVAD is functioning within specified parameters.  The patient performs LVAD self-test daily.  LVAD interrogation was negative for any significant power changes, alarms or PI events/speed drops.  LVAD equipment check completed and is in good working order.  Back-up equipment present.   LVAD education done on emergency procedures and precautions and reviewed exit site care.  Length of Stay: 19  [name]  MD  09/29/2014, 12:58 PM  VAD Team --- VAD ISSUES ONLY--- Pager [phone] (7am - 7am)  Advanced Heart Failure Team  Pager [phone] (M-F; 7a - 4p)  Please contact [facility] for night-coverage after hours (4p -7a ) and weekends on [email or web address]

## 2014-09-29 NOTE — Progress Notes (Signed)
HeartMate 2 Rounding Note  Subjective:   POD #8   VAD implant  (DT)  For acute /chronic systolic HF from ischemic cardiomyopathy and cardiogenic shock on preop IABP  Recent PE- with large R pulmonary infarct,necrotic    lung/pneumonia- poss empyema Restrictive lung disease- FEV1 1.3 from ankylosing spondylitis DM  HX ventricular, atrial arrhythmia Postoperative CO2 retention requiring BiPAP- now resolved  Patient continues to progress- walking Wet cough- no fever , elevated WBC improving back on iv Zosyn  VAD flows and PI improved after lasix stopped- no PI events in 24hrs Ileus resolved-  TNA stopped, reglan stopped Maintaining NSR on po amiodarone Pocket drain 80cc- will DC today  Pump parameters are satisfactory. PI is 4 MAP now 78    INR slowly rising - now 2.2 - coumadin per PharmD   Insomnia treated with xanax last pm but pt drowsy this am- will DC and resume trazodone he took preop 50mg   Q hs   HeartMate II LVAD:  Flow 4.5 liters/min, speed 9400 rpm power 4.1, PI  2.8  Controller intact  Objective:    Vital Signs:   Temp:  [97.6 F (36.4 C)-98.6 F (37 C)] 98.6 F (37 C) (03/29 2017) Pulse Rate:  [34-79] 77 (03/29 2017) Resp:  [25-32] 25 (03/29 1551) SpO2:  [92 %-100 %] 98 % (03/29 1551) Weight:  [184 lb 8.4 oz (83.7 kg)] 184 lb 8.4 oz (83.7 kg) (03/30 0401) Last BM Date: 09/27/14 Mean arterial Pressure 75  Intake/Output:   Intake/Output Summary (Last 24 hours) at 09/29/14 0835 Last data filed at 09/29/14 0600  Gross per 24 hour  Intake    850 ml  Output    760 ml  Net     90 ml     Physical Exam: General:  Alert on nasal cannula HEENT: normal Neck: supple. JVP . Carotids 2+ bilat; no bruits. No lymphadenopathy or thryomegaly appreciated. Cor: Mechanical heart sounds with LVAD hum present. Lungs: coarse bs on R, moving better air today Abdomen: soft, nontender, nondistended. No hepatosplenomegaly. No bruits or masses. Good bowel sounds. Driveline:  C/D/I; securement device intact and driveline incorporated Extremities: warm well perfused,no cyanosis, clubbing, rash, edema- IABP out without hematoma and groin- c/o pain in R middle finger - prob related to a-line Neuro: moves all extremities   Telemetry:nsr  Labs: Basic Metabolic Panel:  Recent Labs Lab 09/23/14 0400  09/24/14 0445 09/25/14 0410  09/26/14 0412 09/26/14 0509 09/26/14 1713 09/27/14 0430 09/28/14 0530  NA 136  < > 134* 133*  < > 131* 135 136 138 137  K 4.3  < > 4.1 4.0  < > 4.2 3.5 3.7 3.6 4.1  CL 98  < > 96 92*  < > 90* 88* 89* 90* 93*  CO2 29  --  32 32  --  38*  --   --  39* 39*  GLUCOSE 136*  < > 64* 115*  < > 487* 171* 128* 105* 98  BUN 18  < > 30* 41*  < > 35* 37* 32* 27* 19  CREATININE 1.03  < > 1.13 1.22  < > 1.11 1.30 1.10 0.97 0.81  CALCIUM 8.4  --  8.4 8.5  --  8.1*  --   --  8.2* 8.4  MG 2.6*  --  2.2 2.2  --  2.3  --   --  1.9 2.1  PHOS 4.4  --  2.5 3.6  --  4.5  --   --   --   --   < > =  values in this interval not displayed.  Liver Function Tests:  Recent Labs Lab 09/24/14 0445 09/25/14 0410 09/26/14 0412 09/27/14 0430 09/28/14 0530  AST 170* 101* 97* 81* 64*  ALT 162* 135* 120* 96* 77*  ALKPHOS 123* 136* 155* 172* 182*  BILITOT 2.2* 1.6* 1.3* 1.5* 1.0  PROT 5.7* 5.2* 6.0 5.7* 5.8*  ALBUMIN 2.5* 2.4* 2.4* 2.2* 2.1*   No results for input(s): LIPASE, AMYLASE in the last 168 hours. No results for input(s): AMMONIA in the last 168 hours.  CBC:  Recent Labs Lab 09/23/14 0400  09/24/14 0445 09/25/14 0410  09/26/14 0412 09/26/14 0509 09/26/14 1713 09/27/14 0430 09/28/14 0530 09/29/14 0530  WBC 15.8*  --  14.8* 13.1*  --  11.4*  --   --  15.4* 17.2* 15.6*  NEUTROABS 13.9*  --  12.8* 11.0*  --  9.4*  --   --  12.5*  --   --   HGB 8.8*  < > 8.0* 7.9*  < > 8.2* 10.5* 10.5* 9.3* 9.2* 8.7*  HCT 28.5*  < > 25.7* 25.4*  < > 28.1* 31.0* 31.0* 31.2* 30.8* 29.6*  MCV 90.5  --  88.6 88.8  --  94.6  --   --  90.2 91.9 94.0  PLT 181   --  183 224  --  280  --   --  405* 511* 576*  < > = values in this interval not displayed.  INR:  Recent Labs Lab 09/25/14 0410 09/26/14 0412 09/27/14 0430 09/28/14 0530 09/29/14 0530  INR 1.84* 2.02* 1.81* 2.25* 2.47*    Other results:  EKG:   Imaging: Dg Chest 2 View  09/28/2014   CLINICAL DATA:  69 year old male with left ventricular assist device. Initial encounter.  EXAM: CHEST  2 VIEW  COMPARISON:  09/27/2014 and earlier.  FINDINGS: Upright PA and lateral views of the chest today. LVED in place. Stable cardiomegaly and mediastinal contours. Stable right PICC line. No pneumothorax or pulmonary edema. Since 08/31/2014 improved ventilation at the right lung base. Residual right lung base opacity may in part reflect fluid trapped in the fissure. Small residual bilateral pleural effusions. No areas of worsening ventilation. IVC filter. Ankylosis of the spine.  IMPRESSION: 1. Small bilateral pleural effusions. Confluent right lung base opacity probably a combination of residual airspace disease and pleural fluid in the minor fissure. 2. LVAD in place, no adverse features identified. No new cardiopulmonary abnormality.   Electronically Signed   By: Genevie Ann M.D.   On: 09/28/2014 07:30     Medications:     Scheduled Medications: . amiodarone  200 mg Oral BID  . aspirin EC  325 mg Oral Daily   Or  . aspirin  324 mg Per Tube Daily  . bisacodyl  10 mg Oral Daily   Or  . bisacodyl  10 mg Rectal Daily  . dextromethorphan-guaiFENesin  1 tablet Oral BID  . docusate sodium  200 mg Oral Daily  . ferrous [id number] C-folic acid  1 capsule Oral TID PC  . gabapentin  300 mg Oral BID  . insulin aspart  0-24 Units Subcutaneous TID AC & HS  . pantoprazole  40 mg Oral Daily  . piperacillin-tazobactam (ZOSYN)  IV  3.375 g Intravenous 3 times per day  . polyethylene glycol  17 g Oral Daily  . rosuvastatin  10 mg Oral q1800  . sodium chloride  10-40 mL Intracatheter Q12H  . sodium  chloride  3 mL Intravenous Q12H  .  traZODone  50 mg Oral QHS  . Warfarin - Pharmacist Dosing Inpatient   Does not apply q1800    Infusions: . sodium chloride    . sodium chloride 20 mL/hr at 09/23/14 1900    PRN Medications: sodium chloride, magnesium hydroxide, ondansetron (ZOFRAN) IV, sodium chloride, sodium chloride, traMADol   Assessment:  Stable hemodynamics- , VAD speed 9400 C02 improved off BiPAP Wt  At baseline  Holding lasix Cont IV Zosyn for possible pneumonia, empyema from past pulmonary infarct  Plan/Discussion:      Cont with VAD teaching Increase mobility- still weak  Coumadin per pharmD goal 2.5-3 DC EPWs just before DC Add Ensure to supp diet    I reviewed the LVAD parameters from today, and compared the results to the patient's prior recorded data.  No programming changes were made.  The LVAD is functioning within specified parameters.  The patient performs LVAD self-test daily.  LVAD interrogation was negative for any significant power changes, alarms or PI events/speed drops.  LVAD equipment check completed and is in good working order.  Back-up equipment present.   LVAD education done on emergency procedures and precautions and reviewed exit site care.  Length of Stay: [place] III 09/29/2014, 8:35 AM

## 2014-09-29 NOTE — Progress Notes (Signed)
NUTRITION FOLLOW UP  DOCUMENTATION CODES Per approved criteria  -Non-severe (moderate) malnutrition in the context of chronic illness  Pt meets criteria for non-severe moderate malnutrition in the context of chronic illness as evidenced by 11.8% weight loss in 3 months, and mild subcutaneous body fat and muscle mass depletion.     Intervention:   -Continue Ensure Enlive po TID, each supplement provides 350 kcal and 20 grams of protein  Nutrition Dx:   Increased nutrient needs related to LVAD as evidenced by estimated energy needs; ongoing  Goal:   Pt to meet >/= 90% of estimated energy needs; not met  Monitor:   PO/supplement intake, labs, weight changes, I/O's  Assessment:   69 y/o with history of CAD with stenting of LCX 2013, DM2, carotid stenting and ankylosing spondylitis. Presented with an anterolateral MI in 12/15 and was discharged with a life vest and medical therapy. Pt was admitted with worsening heart failure symptoms and has undergone right heart catheterization and is now going to be started on Milrinone and then LVAD.   3/22- Echocardiogram transesophageal; insertion of implantable left ventricle assist device 3/23- Extubated on BiPAP with no complications; pleural CT removed 3/24- Remains on BiPAP for CO2 retention secondary to probably restrictive lung disease from ankylosing spondylitis; attempting to wean off BiPAP today 2/25- Consulted for TPN/TNA  RN in dressing change with pt at time of visit.  TPN was d/c on 09/26/14. Pt has been advanced to a Heart Healthy/ Carb modified diet. Tolerating well; noted 50-100% meal completion. Ensure TID was just ordered this AM- RD will monitor for acceptance.  Noted decrease in wt due to IV lasix, which has been d/c.  Labs reviewed. Cl: 89, BUN: 32, Calcium: 8.1, CBGS: 110-127.   Height: Ht Readings from Last 1 Encounters:  09/10/14 5' 8"  (1.727 m)    Weight Status:   Wt Readings from Last 1 Encounters:  09/29/14 184 lb  8.4 oz (83.7 kg)   09/24/14 192 lb 7.4 oz (87.3 kg)       Re-estimated needs:  Kcal: 2000-2200 Protein: 100-110 grams Fluid: 2.0-2.2 L  Skin: closed incisions on chest and rt abdomen  Diet Order: Diet heart healthy/carb modified Room service appropriate?: Yes; Fluid consistency:: Thin   Intake/Output Summary (Last 24 hours) at 09/29/14 1032 Last data filed at 09/29/14 0900  Gross per 24 hour  Intake    720 ml  Output    760 ml  Net    -40 ml    Last BM: 09/27/14   Labs:   Recent Labs Lab 09/24/14 0445 09/25/14 0410  09/26/14 0412  09/26/14 1713 09/27/14 0430 09/28/14 0530  NA 134* 133*  < > 131*  < > 136 138 137  K 4.1 4.0  < > 4.2  < > 3.7 3.6 4.1  CL 96 92*  < > 90*  < > 89* 90* 93*  CO2 32 32  --  38*  --   --  39* 39*  BUN 30* 41*  < > 35*  < > 32* 27* 19  CREATININE 1.13 1.22  < > 1.11  < > 1.10 0.97 0.81  CALCIUM 8.4 8.5  --  8.1*  --   --  8.2* 8.4  MG 2.2 2.2  --  2.3  --   --  1.9 2.1  PHOS 2.5 3.6  --  4.5  --   --   --   --   GLUCOSE 64* 115*  < > 487*  < >  128* 105* 98  < > = values in this interval not displayed.  CBG (last 3)   Recent Labs  09/28/14 1634 09/28/14 2120 09/29/14 0656  GLUCAP 110* 115* 127*    Scheduled Meds: . amiodarone  200 mg Oral BID  . aspirin EC  325 mg Oral Daily   Or  . aspirin  324 mg Per Tube Daily  . bisacodyl  10 mg Oral Daily   Or  . bisacodyl  10 mg Rectal Daily  . dextromethorphan-guaiFENesin  1 tablet Oral BID  . docusate sodium  200 mg Oral Daily  . feeding supplement (ENSURE ENLIVE)  237 mL Oral TID BM  . ferrous [id number] C-folic acid  1 capsule Oral TID PC  . gabapentin  300 mg Oral BID  . insulin aspart  0-24 Units Subcutaneous TID AC & HS  . pantoprazole  40 mg Oral Daily  . piperacillin-tazobactam (ZOSYN)  IV  3.375 g Intravenous 3 times per day  . polyethylene glycol  17 g Oral Daily  . rosuvastatin  10 mg Oral q1800  . sodium chloride  10-40 mL Intracatheter Q12H  . sodium  chloride  3 mL Intravenous Q12H  . traZODone  50 mg Oral QHS  . Warfarin - Pharmacist Dosing Inpatient   Does not apply q1800    Continuous Infusions: . sodium chloride    . sodium chloride 20 mL/hr at 09/23/14 1900    Kayleen Alig A. Jimmye Norman, RD, LDN, CDE Pager: [phone] After hours Pager: [phone]

## 2014-09-29 NOTE — Progress Notes (Signed)
Admitted 09/10/14 with worsening heart failure and cardiogenic shock.  HeartMate II LVAD implated on 09/21/14 by Dr. Darcey Nora as DT VAD.   Vital signs: Temp:  98.6 (oral) HR: 77 - 79, maintaining NSR Doppler MAP:  70 - 86 O2 Sat:  92 - 100% 2 LPM  Wt in lbs:  173 > 194 > 194.7 > 192.7 > 187 > 184   LVAD interrogation reveals:  Speed:  9400 Flow: 5.9 Power:  6.2 PI:  5.0 Alarms: none Events: 1 PI event this morning with PI 2.0 Fixed speed:  9400 Low speed limit: 8800  Blood products: OR:  1 unit PC, 2 plts, 3 FFP ICU: 09/22/14 - 1 unit PC  Nitric Oxide:  Off 09/24/14  ASA:  325 mg started 09/22/14  Warfarin: started 09/22/14  Plavix: 09/27/14 Not restarting per Dr. Haroldine Laws    Labs:  LDH trend: 364 > 496 > 380 > 341 > 375 > 398 > 379 > 350  INR trend: 1.42 > 1.44 > 1.6 > 1.84 > 2.02 > 1.81 > 2.47  Co-Ox:  62.3%   WBC:  13.3 > 15.8 > 14.8 > 13.1 > 11.4 > 15.4 (Urine Culture sent) > 17.2 > 15.6  Hgb:  7.8 (received one unit PC) > 9.0 > 8.8 > 8.0 > 7.9 > 8.2 > 9.3  Drive Line: C/D/I with stabilization device in place. Being changed daily. Changed today with his wife at the bedside. Site looks good, scant thin bloody drainage, small redness, no warmth/odor or tenderness. Wife was able to perform sterile technique, set up sterile dressing field (forgot the silver ribbon) and was able to perform dressing change adequately. Reinforced with her the importance of ChloraPrep and firm scrubbing for at least 15 seconds working inside out at the site. She was able to verbalize to me signs and symptoms of infection.   She correctly changed power sources from PM to batteries (his hand is still very tender and is having a hard time doing it himself currently). They were able to toggle through the display screen and identify the VAD numbers. Discussed the VAD log book and how to correctly fill out and trend their numbers.   Plan/Recommendations and Discharge Needs as discussed with VAD  team:  1.  Tolerating diet well--TNA off. Continue to push nutrition.   2.  Right hand pain--pain, weakness and some purple discoloration to the right thumb/forefinger/index finger. Arterial line on this side previously. Dr. [name] started Neurontin. Mr. Biegel reports that the pain is improving slowly but still making it difficult to switch from power sources, but encouraged him to still try to participate as much as he can.   3.  Still on 2 LPM [place]. Seems a little short of breath in conversing with him, BNP is 845.8 today. Continue to push pulmonary toilet and mobility.   4. WBC >15 today, better than yesterday--urine culture & sputum culture NGTD. Drive line looks good.   5.  PT recommending home health PT and Rollator walker.   6. Planning on education session with his wife and their second caregiver Friday 10/01/14 @ 0900. Home equipment has been maintained by [employer] today; will charge batteries and being up personal equipment Friday.

## 2014-09-29 NOTE — Progress Notes (Signed)
Patient ambulated 350 ft with RW, RN and spouse.  Tolerated well. Dressing applied to MSI per MD orders. Patient also refused ensure today. Notes he prefers Optician, dispensing. RN will leave a note for oncoming shift. RN will continue to monitor. Alfredo Bach RN BSN 09/29/2014 5:47 PM

## 2014-09-30 DIAGNOSIS — I5023 Acute on chronic systolic (congestive) heart failure: Secondary | ICD-10-CM

## 2014-09-30 DIAGNOSIS — E872 Acidosis: Secondary | ICD-10-CM

## 2014-09-30 DIAGNOSIS — J9601 Acute respiratory failure with hypoxia: Secondary | ICD-10-CM

## 2014-09-30 DIAGNOSIS — R0689 Other abnormalities of breathing: Secondary | ICD-10-CM

## 2014-09-30 DIAGNOSIS — Z95811 Presence of heart assist device: Secondary | ICD-10-CM

## 2014-09-30 DIAGNOSIS — R0902 Hypoxemia: Secondary | ICD-10-CM

## 2014-09-30 LAB — BLOOD GAS, ARTERIAL
Acid-Base Excess: 11.8 mmol/L — ABNORMAL HIGH (ref 0.0–2.0)
Acid-Base Excess: 13.6 mmol/L — ABNORMAL HIGH (ref 0.0–2.0)
BICARBONATE: 39.8 meq/L — AB (ref 20.0–24.0)
BICARBONATE: 40.1 meq/L — AB (ref 20.0–24.0)
DRAWN BY: 331001
Delivery systems: POSITIVE
Drawn by: 33100
Expiratory PAP: 8
FIO2: 0.3 %
Inspiratory PAP: 16
MODE: POSITIVE
O2 Content: 4 L/min
O2 SAT: 96.4 %
O2 Saturation: 99.5 %
PCO2 ART: 79.3 mmHg — AB (ref 35.0–45.0)
PO2 ART: 193 mmHg — AB (ref 80.0–100.0)
Patient temperature: 97.4
Patient temperature: 98.6
TCO2: 43 mmol/L (ref 0–100)
TCO2: 42.5 mmol/L (ref 0–100)
pCO2 arterial: 100 mmHg (ref 35.0–45.0)
pH, Arterial: 7.218 — ABNORMAL LOW (ref 7.350–7.450)
pH, Arterial: 7.324 — ABNORMAL LOW (ref 7.350–7.450)
pO2, Arterial: 85.6 mmHg (ref 80.0–100.0)

## 2014-09-30 LAB — CBC
HCT: 29.6 % — ABNORMAL LOW (ref 39.0–52.0)
Hemoglobin: 8.6 g/dL — ABNORMAL LOW (ref 13.0–17.0)
MCH: 27.5 pg (ref 26.0–34.0)
MCHC: 29.1 g/dL — ABNORMAL LOW (ref 30.0–36.0)
MCV: 94.6 fL (ref 78.0–100.0)
Platelets: 659 10*3/uL — ABNORMAL HIGH (ref 150–400)
RBC: 3.13 MIL/uL — AB (ref 4.22–5.81)
RDW: 17.8 % — AB (ref 11.5–15.5)
WBC: 14.5 10*3/uL — AB (ref 4.0–10.5)

## 2014-09-30 LAB — PROTIME-INR
INR: 2.64 — ABNORMAL HIGH (ref 0.00–1.49)
Prothrombin Time: 28.4 seconds — ABNORMAL HIGH (ref 11.6–15.2)

## 2014-09-30 LAB — MAGNESIUM: MAGNESIUM: 2.1 mg/dL (ref 1.5–2.5)

## 2014-09-30 LAB — GLUCOSE, CAPILLARY
GLUCOSE-CAPILLARY: 117 mg/dL — AB (ref 70–99)
GLUCOSE-CAPILLARY: 115 mg/dL — AB (ref 70–99)
Glucose-Capillary: 102 mg/dL — ABNORMAL HIGH (ref 70–99)
Glucose-Capillary: 107 mg/dL — ABNORMAL HIGH (ref 70–99)

## 2014-09-30 LAB — BASIC METABOLIC PANEL
Anion gap: 10 (ref 5–15)
BUN: 12 mg/dL (ref 6–23)
CHLORIDE: 92 mmol/L — AB (ref 96–112)
CO2: 38 mmol/L — ABNORMAL HIGH (ref 19–32)
CREATININE: 0.76 mg/dL (ref 0.50–1.35)
Calcium: 8.7 mg/dL (ref 8.4–10.5)
GFR calc non Af Amer: >90 mL/min (ref >90)
GLUCOSE: 131 mg/dL — AB (ref 70–99)
Potassium: 4.5 mmol/L (ref 3.5–5.1)
SODIUM: 140 mmol/L (ref 135–145)

## 2014-09-30 LAB — AMMONIA: Ammonia: 46 umol/L — ABNORMAL HIGH (ref 11–32)

## 2014-09-30 LAB — CARBOXYHEMOGLOBIN
Carboxyhemoglobin: 1.1 % (ref 0.5–1.5)
Methemoglobin: 1 % (ref 0.0–1.5)
O2 Saturation: 85.1 %
Total hemoglobin: 9.4 g/dL — ABNORMAL LOW (ref 13.5–18.0)

## 2014-09-30 LAB — LACTATE DEHYDROGENASE: LDH: 352 U/L — AB (ref 94–250)

## 2014-09-30 MED ORDER — WARFARIN SODIUM 2 MG PO TABS
2.0000 mg | ORAL_TABLET | Freq: Once | ORAL | Status: AC
  Administered 2014-09-30: 2 mg via ORAL
  Filled 2014-09-30 (×2): qty 1

## 2014-09-30 MED ORDER — WARFARIN SODIUM 2 MG PO TABS
2.0000 mg | ORAL_TABLET | Freq: Once | ORAL | Status: DC
  Filled 2014-09-30: qty 1

## 2014-09-30 NOTE — Progress Notes (Signed)
Physical Therapy Treatment Patient Details Name: Scott [name] MRN: [id number] DOB: 01/25/1946 Today's Date: 09/30/2014    History of Present Illness 69 y.o. M with extensive cardiac hx as well as recent PE / DVT, admitted for SOB due to heart failure, cardiogenic, shock, PE. Thought to be good candidate for VAD (to be evaluated by VAD team 3/17). Found to have mod - large R pleural effusion. LVAD implantation 3/23    PT Comments    Progressing well with LVAD program.  Pt and wife working well as team to switch over to batteries.  Pt tolerated gait well.  Follow Up Recommendations  Home health PT;Supervision/Assistance - 24 hour     Equipment Recommendations  Rolling walker with 5" wheels    Recommendations for Other Services       Precautions / Restrictions Precautions Precautions: Sternal;Fall Precaution Comments: LVAD Restrictions Weight Bearing Restrictions: No    Mobility  Bed Mobility Overal bed mobility: Needs Assistance Bed Mobility: Supine to Sit;Sit to Supine     Supine to sit: Supervision Sit to supine: Supervision      Transfers Overall transfer level: Needs assistance Equipment used: Rolling walker (2 wheeled) Transfers: Sit to/from Stand Sit to Stand: Supervision         General transfer comment: reinforced cues for hand placement  Ambulation/Gait Ambulation/Gait assistance: Supervision Ambulation Distance (Feet): 550 Feet (with 1 standing rest break to attempt SpO2's) Assistive device: Rolling walker (2 wheeled) Gait Pattern/deviations: Step-through pattern Gait velocity: moderate speed   General Gait Details: steady, moderate pace with good posture and light use of UE's   Stairs            Wheelchair Mobility    Modified Rankin (Stroke Patients Only)       Balance   Sitting-balance support: No upper extremity supported Sitting balance-Leahy Scale: Good       Standing balance-Leahy Scale: Fair                      Cognition Arousal/Alertness: Awake/alert Behavior During Therapy: Flat affect Overall Cognitive Status: Within Functional Limits for tasks assessed       Memory: Decreased short-term memory              Exercises      General Comments General comments (skin integrity, edema, etc.): pt and wife sequenced well through the process of change of to batteries.  Remembered back up bag with prompting.      Pertinent Vitals/Pain Pain Assessment: No/denies pain    Home Living                      Prior Function            PT Goals (current goals can now be found in the care plan section) Acute Rehab PT Goals Patient Stated Goal: work on cars PT Goal Formulation: With patient/family Time For Goal Achievement: 10/07/14 Potential to Achieve Goals: Good Progress towards PT goals: Progressing toward goals    Frequency  Min 3X/week    PT Plan Current plan remains appropriate    Co-evaluation             End of Session Equipment Utilized During Treatment: Oxygen Activity Tolerance: Patient tolerated treatment well Patient left: with call bell/phone within reach;with nursing/sitter in room;in bed (for dressing change)     Time: 3244-0102 PT Time Calculation (min) (ACUTE ONLY): 34 min  Charges:  $Gait Training: 23-37 mins  G Codes:      Neils [place], [name] 09/30/2014, 10:48 AM 09/30/2014  Donnella Sham, [place] [phone]  (pager)

## 2014-09-30 NOTE — Significant Event (Signed)
Rapid Response Event Note  Overview: While rounding on 2w alerted to abnormal ABG for this patient Time Called: 1445 Arrival Time: 3614 Event Type: Respiratory, Neurologic  Initial Focused Assessment:  Patient sitting in chair- very lethargic  Skin warm and dry - grey in color - weakly stood to bed with support - will open eyes to loud voice and stimulus but unable to stay awake.  Unable to follow commands of cough or stick out tongue.  Resps rapid and shallow - bil BS with few rhonchi.  LVAD intact.  MAP with doppler 100.  HR 81.   CBG 115. ABG reveals hypercarbic respiratory failure - RN Nego on phone with Dr. Missy Sabins with update on ABG and status.  Wife reports he ate lunch at 1230  - no nausea noted today.      Interventions:  RT here  - placed on Bipap at 16/8 = decreased oxygen to 30% due to pO2 193 on ABG.  Placed on battery pack for LVAD and stat transfer to 2H08.  Dr. Nelda Marseille present in [facility] - handoff to [facility] and PCCM staff.  Wife updated.     Event Summary: Name of Physician Notified: Dr. Missy Sabins at  (prior to RRT)  Name of Consulting Physician Notified: Dr. Nelda Marseille at [place]  Outcome: Transferred (Comment)  Event End Time: 1530  Quin Hoop

## 2014-09-30 NOTE — Progress Notes (Signed)
HeartMate 2 Rounding Note  Subjective:    Post Day 9 - HMII LVAD   Remains off milrinone. Lethargic overnight and worse this am. Awakens then falls right back to sleep. Denies CP. SOB. No fevers or chills.  On Zosyn. WBC falling. UA +  Ucx no growth.   LVAD INTERROGATION:  HeartMate II LVAD:  Flow 6.5 liters/min, speed 9600 power 6.8, PI 3.9   occasional PI events.  Objective:    Vital Signs:   Temp:  [97.4 F (36.3 C)-97.7 F (36.5 C)] 97.7 F (36.5 C) (03/31 1722) Pulse Rate:  [79-81] 79 (03/31 1722) Resp:  [19-28] 20 (03/31 1722) SpO2:  [85 %-100 %] 100 % (03/31 1722) Weight:  [182 lb 5.1 oz (82.7 kg)] 182 lb 5.1 oz (82.7 kg) (03/31 0429) Last BM Date: 09/29/14 Mean arterial Pressure  70-80  Intake/Output:   Intake/Output Summary (Last 24 hours) at 09/30/14 1758 Last data filed at 09/30/14 1249  Gross per 24 hour  Intake    240 ml  Output    701 ml  Net   -461 ml     Physical Exam: General:  sitting in chair. Extremely lethargic but awakens to voice HEENT: normal Neck: supple. Carotids 2+ bilat; no bruits. No lymphadenopathy or thryomegaly appreciated.  Cor: Mechanical heart sounds with LVAD hum present.  Lungs: clear Abdomen: soft, nontender, nondistended. No hepatosplenomegaly. No bruits or masses. Good bowel sounds. Driveline: Dressing looks good.  Extremities: no cyanosis, clubbing, rash, no edema R hand weak. R index finger slightly mottled. Sensation intact Neuro: Lethargic but awakens and follows commands. +asterixis  Telemetry: NSR 80-90s   Labs: Basic Metabolic Panel:  Recent Labs Lab 09/24/14 0445 09/25/14 0410  09/26/14 0412 09/26/14 0509 09/26/14 1713 09/27/14 0430 09/28/14 0530 09/30/14 1150  NA 134* 133*  < > 131* 135 136 138 137 140  K 4.1 4.0  < > 4.2 3.5 3.7 3.6 4.1 4.5  CL 96 92*  < > 90* 88* 89* 90* 93* 92*  CO2 32 32  --  38*  --   --  39* 39* 38*  GLUCOSE 64* 115*  < > 487* 171* 128* 105* 98 131*  BUN 30* 41*  < > 35* 37* 32*  27* 19 12  CREATININE 1.13 1.22  < > 1.11 1.30 1.10 0.97 0.81 0.76  CALCIUM 8.4 8.5  --  8.1*  --   --  8.2* 8.4 8.7  MG 2.2 2.2  --  2.3  --   --  1.9 2.1 2.1  PHOS 2.5 3.6  --  4.5  --   --   --   --   --   < > = values in this interval not displayed.  Liver Function Tests:  Recent Labs Lab 09/24/14 0445 09/25/14 0410 09/26/14 0412 09/27/14 0430 09/28/14 0530  AST 170* 101* 97* 81* 64*  ALT 162* 135* 120* 96* 77*  ALKPHOS 123* 136* 155* 172* 182*  BILITOT 2.2* 1.6* 1.3* 1.5* 1.0  PROT 5.7* 5.2* 6.0 5.7* 5.8*  ALBUMIN 2.5* 2.4* 2.4* 2.2* 2.1*   No results for input(s): LIPASE, AMYLASE in the last 168 hours.  Recent Labs Lab 09/30/14 1445  AMMONIA 46*    CBC:  Recent Labs Lab 09/24/14 0445 09/25/14 0410  09/26/14 0412  09/26/14 1713 09/27/14 0430 09/28/14 0530 09/29/14 0530 09/30/14 0510  WBC 14.8* 13.1*  --  11.4*  --   --  15.4* 17.2* 15.6* 14.5*  NEUTROABS 12.8* 11.0*  --  9.4*  --   --  12.5*  --   --   --   HGB 8.0* 7.9*  < > 8.2*  < > 10.5* 9.3* 9.2* 8.7* 8.6*  HCT 25.7* 25.4*  < > 28.1*  < > 31.0* 31.2* 30.8* 29.6* 29.6*  MCV 88.6 88.8  --  94.6  --   --  90.2 91.9 94.0 94.6  PLT 183 224  --  280  --   --  405* 511* 576* 659*  < > = values in this interval not displayed.  INR:  Recent Labs Lab 09/26/14 0412 09/27/14 0430 09/28/14 0530 09/29/14 0530 09/30/14 0510  INR 2.02* 1.81* 2.25* 2.47* 2.64*    Other results:    Imaging: No results found.   Medications:     Scheduled Medications: . amiodarone  200 mg Oral BID  . aspirin EC  325 mg Oral Daily  . bisacodyl  10 mg Oral Daily  . dextromethorphan-guaiFENesin  1 tablet Oral BID  . docusate sodium  200 mg Oral Daily  . feeding supplement (ENSURE ENLIVE)  237 mL Oral TID BM  . ferrous [id number] C-folic acid  1 capsule Oral TID PC  . gabapentin  300 mg Oral BID  . insulin aspart  0-24 Units Subcutaneous TID AC & HS  . pantoprazole  40 mg Oral Daily  .  piperacillin-tazobactam (ZOSYN)  IV  3.375 g Intravenous 3 times per day  . polyethylene glycol  17 g Oral Daily  . rosuvastatin  10 mg Oral q1800  . sodium chloride  10-40 mL Intracatheter Q12H  . sodium chloride  3 mL Intravenous Q12H  . traZODone  50 mg Oral QHS  . warfarin  2 mg Oral Once  . Warfarin - Pharmacist Dosing Inpatient   Does not apply q1800    Infusions: . sodium chloride    . sodium chloride 20 mL/hr at 09/23/14 1900    PRN Medications: sodium chloride, magnesium hydroxide, ondansetron (ZOFRAN) IV, sodium chloride, sodium chloride, traMADol   Assessment:  1. S/P HMII LVAD 09/21/14  2.  A/C systolic HF class IV with cardiogenic shock- EF 15% with moderate RV dysfunction 3/16 echo.  3.  CAD s/p Anterior STEMI on 06/12/14 with stenting of LAD- on coumadin + plavix, statin.  4.  06/12/14 VT in setting of STEMI 5.  Ankylosing spondylitis. 6. Bilateral PE with R DVT- S/P IVC filter 08/27/2014--->coumadin 7.  Asystole noted from Life Vest 8.  LV thrombus 9.  Full Code 10. Hemoptysis, resolved 11. A/C renal failure, resolved 12. Pleural effusion, transudative  13. ?PNA 14. Acute Blood Loss- Anemia- Received 1UPRBCs 3/23  Plan/Discussion:    POD #9 HMII LVAD.   Very lethargic on exam. Stat ABG obtained and shows severe CO2 retention with pCO2 100. Patient placed on BIPAP and transferred to ICU. Case discussed with Drs. Byrum and Yacoub. Wil ltry to bridge with BiPAP. May need intubation. Weight currently stable off diuretics. Co-ox and ammonia ok.   Maintaining NSR on amio.   On coumadin INR 2.6  Will not restart Plavix as anterior wall completely necrotic.    Ramp echo when improved. Continue 9600 for now. Suspect correct speed is either 9400 or 9600.  The patient is critically ill with multiple organ systems failure and requires high complexity decision making for assessment and support, frequent evaluation and titration of therapies, application of advanced  monitoring technologies and extensive interpretation of multiple databases.   Critical Care Time devoted to patient  care services described in this note is 35 Minutes.   I reviewed the LVAD parameters from today, and compared the results to the patient's prior recorded data.  No programming changes were made.  The LVAD is functioning within specified parameters.  The patient performs LVAD self-test daily.  LVAD interrogation was negative for any significant power changes, alarms or PI events/speed drops.  LVAD equipment check completed and is in good working order.  Back-up equipment present.   LVAD education done on emergency procedures and precautions and reviewed exit site care.  Length of Stay: 20  [name]  MD  09/30/2014, 5:58 PM  VAD Team --- VAD ISSUES ONLY--- Pager [phone] (7am - 7am)  Advanced Heart Failure Team  Pager [phone] (M-F; 7a - 4p)  Please contact [facility] for night-coverage after hours (4p -7a ) and weekends on [email or web address]

## 2014-09-30 NOTE — Progress Notes (Signed)
PULMONARY / CRITICAL CARE MEDICINE   Name: Scott [name] MRN: [id number] DOB: Aug 01, 1945    ADMISSION DATE:  09/10/2014 CONSULTATION DATE:  Initial - 3/18.Marland Kitchen Re-consulted 3/31  REFERRING MD :  Bensimhon   CHIEF COMPLAINT:  Respiratory failure   INITIAL PRESENTATION:  69 y.o. M with extensive cardiac hx as well as recent PE / DVT, admitted for SOB due to heart failure, cardiogenic, shock, PE. Thought to be good candidate for VAD. Found to have mod - large R pleural effusion, PCCM initially consulted 3/18 for thoracentesis as part of optimization prior to VAD. Pt ultimately had VAD placed 3/23.  Was progressing well post op until 3/31 when he developed progressive AMS.  Tx to ICU and PCCM consulted.   STUDIES:  LE dopplers from OSH: Right leg DVT  CT Chest from OSH: bilateral diffuse PEs w/o overt signs of right heart strain ECHO 12/12 >>EF 20%; mid Lventricle and apex is akinetic ECHO 12/21 >> Echo showed EF <20%, severely decreased global systolic LV function, dilated CM, moderate MR/TR  SIGNIFICANT EVENTS: 3/11 admit  3/18 thoracentesis - exudative effusion, likely parapneumonic  3/23 VAD placement  3/31 acute hypercarbic respiratory failure   SUBJECTIVE:  Increasing lethargy throughout the day.  Became obtunded on floor, hypercarbic with PCO2>100.  Tx ICU and placed on bipap, PCCM consulted.   VITAL SIGNS: Temp:  [97.4 F (36.3 C)-97.6 F (36.4 C)] 97.6 F (36.4 C) (03/31 1503) Pulse Rate:  [81] 81 (03/31 1503) Resp:  [19-28] 21 (03/31 0416) SpO2:  [85 %-100 %] 100 % (03/31 1503) Weight:  [182 lb 5.1 oz (82.7 kg)] 182 lb 5.1 oz (82.7 kg) (03/31 0429) HEMODYNAMICS:   VENTILATOR SETTINGS:   INTAKE / OUTPUT:  Intake/Output Summary (Last 24 hours) at 09/30/14 1626 Last data filed at 09/30/14 1249  Gross per 24 hour  Intake    480 ml  Output   1301 ml  Net   -821 ml    PHYSICAL EXAMINATION: General:  acutely ill appearing male Neuro:  Lethargic, arouses to  loud voice, follows commands but does not stay awake long  HEENT:  Mm moist, bipap, no JVD  Cardiovascular:  Mechanical heart sounds with VAD Lungs:  resps even, non labored, slightly tachypneic on bipap, few scattered rhonchi  Abdomen:  Soft, non tender, dressings c/d  Musculoskeletal:  Pale, cool, dry, scant BLE edema   LABS:  CBC  Recent Labs Lab 09/28/14 0530 09/29/14 0530 09/30/14 0510  WBC 17.2* 15.6* 14.5*  HGB 9.2* 8.7* 8.6*  HCT 30.8* 29.6* 29.6*  PLT 511* 576* 659*   Coag's  Recent Labs Lab 09/28/14 0530 09/29/14 0530 09/30/14 0510  INR 2.25* 2.47* 2.64*   BMET  Recent Labs Lab 09/27/14 0430 09/28/14 0530 09/30/14 1150  NA 138 137 140  K 3.6 4.1 4.5  CL 90* 93* 92*  CO2 39* 39* 38*  BUN 27* 19 12  CREATININE 0.97 0.81 0.76  GLUCOSE 105* 98 131*   Electrolytes  Recent Labs Lab 09/24/14 0445 09/25/14 0410 09/26/14 0412 09/27/14 0430 09/28/14 0530 09/30/14 1150  CALCIUM 8.4 8.5 8.1* 8.2* 8.4 8.7  MG 2.2 2.2 2.3 1.9 2.1 2.1  PHOS 2.5 3.6 4.5  --   --   --    Sepsis Markers No results for input(s): LATICACIDVEN, PROCALCITON, O2SATVEN in the last 168 hours. ABG  Recent Labs Lab 09/25/14 0420 09/30/14 1436 09/30/14 1548  PHART 7.364 7.218* 7.324*  PCO2ART 55.3* 100.0* 79.3*  PO2ART 132.0* 193.0* 85.6  Liver Enzymes  Recent Labs Lab 09/26/14 0412 09/27/14 0430 09/28/14 0530  AST 97* 81* 64*  ALT 120* 96* 77*  ALKPHOS 155* 172* 182*  BILITOT 1.3* 1.5* 1.0  ALBUMIN 2.4* 2.2* 2.1*   Cardiac Enzymes No results for input(s): TROPONINI, PROBNP in the last 168 hours. Glucose  Recent Labs Lab 09/29/14 0656 09/29/14 1214 09/29/14 1647 09/29/14 2106 09/30/14 0526 09/30/14 1114  GLUCAP 127* 109* 99 140* 102* 117*    Imaging No results found.   ASSESSMENT / PLAN:  PULMONARY Acute hypercarbic respiratory failure  Known PE  Pleural effusions  P:   Bipap  F/u ABG  F/u CXR  Avoid oversedation  Supplemental O2 as  needed    CARDIOVASCULAR CVL RUE PICC  Acute on chronic sCHF - class IV with cardiogenic shock. EF 15%, now s/p LVAD  S/p LVAD placement 3/23 CAD Bilat PE/DVT  LV thrombus  P:  Per Cards  Now off milrinone, lasix  Cont amiodarone  Coumadin    RENAL Acute on chronic renal failure - resolved P:   F/u chem   GASTROINTESTINAL Hyperammonemia  P:   F/u LFT's  Consider lactulose   HEMATOLOGIC Leukocytosis  Anemia - mild  P:  Cont coumadin per pharmacy  Abx as below   INFECTIOUS UTI  P:   Urine 3/28>>>   Zosyn 3/29>>>  ENDOCRINE Hyperglycemia  P:   SSI   NEUROLOGIC AMS - in setting significant hypercarbia.  Improving slowly with bipap.  P:   Cont bipap as above  Supportive care  Consider lactulose in setting hyperammonemia if continued AMS   Nickolas Madrid, NP  AMS due to hypercarbic respiratory failure.  Started on BiPAP.  CO2 is improving and patient is becoming more awake.  Hold in the ICU.  Will need BiPAP nightly to avoid these frequent episodes of hypercabic failure.  Oxygenation ok.  Ok to intubate if needs be.  Diureses per cards and VAD management per cards.  The patient is critically ill with multiple organ systems failure and requires high complexity decision making for assessment and support, frequent evaluation and titration of therapies, application of advanced monitoring technologies and extensive interpretation of multiple databases.   Critical Care Time devoted to patient care services described in this note is  35  Minutes. This time reflects time of care of this signee Dr Jennet Maduro. This critical care time does not reflect procedure time, or teaching time or supervisory time of PA/NP/Med student/Med Resident etc but could involve care discussion time.  Rush Farmer, M.D. [facility] Pulmonary/Critical Care Medicine. Pager: [phone]. After hours pager: [phone].  09/30/2014  4:26 PM

## 2014-09-30 NOTE — Progress Notes (Signed)
Pt asymptomatic and resting, pt had 20 beat run of ventricular tachycardia, MAP is 86, pt oxygen saturation is 96% on 3 liters of oxygen, fixed speed is 9400, flow is 6.0, pulse index is 4.8, power is 6.3, called LVAD pager at [phone] to report arrhythmia, Cloyde Reams returned my call and advised to continue to monitor pt and call MD if ventricular tachycardia returns or becomes prolonged, pt resting, RN will continue to monitor, pt call bell within reach and bed in lowest position.   Fulton Mole, [place] 09/30/2014

## 2014-09-30 NOTE — Progress Notes (Signed)
Mr. Hoglund is still asleep today - his wife is at bedside. She says this was the first night he has slept in a long time - was not sleeping at home. She says that he has awoken to eat dinner last night and breakfast this morning but even slept through his fingerstick - this is a little concerning but he has awoken since. His right hand is still swollen and bruised but he is having small improvements. Offered support to wife - she seems very comfortable and educated on his care.   Vinie Sill, NP Palliative Medicine Team Pager # [phone] (M-F 8a-5p) Team Phone # [phone] (Nights/Weekends)

## 2014-09-30 NOTE — Progress Notes (Signed)
VAD and surgical teams aware of ABG results.  RT and rapid response present in room, Pt on biPap, preparing to transfer to [facility].  Report called to receiving RN.

## 2014-09-30 NOTE — Progress Notes (Signed)
HeartMate 2 Rounding Note  Subjective:   POD #9   VAD implant  (DT)  For acute /chronic systolic HF from ischemic cardiomyopathy and cardiogenic shock on preop IABP  Recent PE- with large R pulmonary infarct,necrotic    lung/pneumonia- poss empyema Restrictive lung disease- FEV1 1.3 from ankylosing spondylitis DM  HX ventricular, atrial arrhythmia Postoperative CO2 retention requiring BiPAP- now resolved Post op O2 requirement persisits- will repeat CXR tomorrow  Patient continues to progress- walking 350 ft Wet cough- no fever , elevated WBC improving now 14k  back on iv Zosyn  VAD flows and PI improved after lasix stopped- Ileus resolved-  TNA stopped, reglan stopped Maintaining NSR on po amiodarone Pocket drainout      Lower sternal incision with no drainage overnite- cont betadine swab and dry dressing Pump parameters are satisfactory. PI is 5 today off milrinone MAP now 64    INR slowly rising - now 2.2 - coumadin per PharmD   HeartMate II LVAD:  Flow 5.5 liters/min, speed 9400 rpm power 5.1, PI  5.0  Controller intact  Objective:    Vital Signs:   Temp:  [97.4 F (36.3 C)-98.6 F (37 C)] 97.4 F (36.3 C) (03/31 0416) Resp:  [19-28] 21 (03/31 0416) SpO2:  [85 %-99 %] 97 % (03/31 0416) Weight:  [182 lb 5.1 oz (82.7 kg)] 182 lb 5.1 oz (82.7 kg) (03/31 0429) Last BM Date: 09/29/14 Mean arterial Pressure 75  Intake/Output:   Intake/Output Summary (Last 24 hours) at 09/30/14 0837 Last data filed at 09/30/14 0220  Gross per 24 hour  Intake    720 ml  Output   1400 ml  Net   -680 ml     Physical Exam: General:  Alert on nasal cannula HEENT: normal Neck: supple. JVP . Carotids 2+ bilat; no bruits. No lymphadenopathy or thryomegaly appreciated. Cor: Mechanical heart sounds with LVAD hum present. Lungs: coarse bs on R, moving better air today Abdomen: soft, nontender, nondistended. No hepatosplenomegaly. No bruits or masses. Good bowel sounds. Driveline: C/D/I;  securement device intact and driveline incorporated Extremities: warm well perfused,no cyanosis, clubbing, rash, edema- IABP out without hematoma and groin- c/o pain in R middle finger - prob related to a-line Neuro: moves all extremities Sternal incision dry   Telemetry:nsr  Labs: Basic Metabolic Panel:  Recent Labs Lab 09/24/14 0445 09/25/14 0410  09/26/14 0412 09/26/14 0509 09/26/14 1713 09/27/14 0430 09/28/14 0530  NA 134* 133*  < > 131* 135 136 138 137  K 4.1 4.0  < > 4.2 3.5 3.7 3.6 4.1  CL 96 92*  < > 90* 88* 89* 90* 93*  CO2 32 32  --  38*  --   --  39* 39*  GLUCOSE 64* 115*  < > 487* 171* 128* 105* 98  BUN 30* 41*  < > 35* 37* 32* 27* 19  CREATININE 1.13 1.22  < > 1.11 1.30 1.10 0.97 0.81  CALCIUM 8.4 8.5  --  8.1*  --   --  8.2* 8.4  MG 2.2 2.2  --  2.3  --   --  1.9 2.1  PHOS 2.5 3.6  --  4.5  --   --   --   --   < > = values in this interval not displayed.  Liver Function Tests:  Recent Labs Lab 09/24/14 0445 09/25/14 0410 09/26/14 0412 09/27/14 0430 09/28/14 0530  AST 170* 101* 97* 81* 64*  ALT 162* 135* 120* 96* 77*  ALKPHOS  123* 136* 155* 172* 182*  BILITOT 2.2* 1.6* 1.3* 1.5* 1.0  PROT 5.7* 5.2* 6.0 5.7* 5.8*  ALBUMIN 2.5* 2.4* 2.4* 2.2* 2.1*   No results for input(s): LIPASE, AMYLASE in the last 168 hours. No results for input(s): AMMONIA in the last 168 hours.  CBC:  Recent Labs Lab 09/24/14 0445 09/25/14 0410  09/26/14 0412  09/26/14 1713 09/27/14 0430 09/28/14 0530 09/29/14 0530 09/30/14 0510  WBC 14.8* 13.1*  --  11.4*  --   --  15.4* 17.2* 15.6* 14.5*  NEUTROABS 12.8* 11.0*  --  9.4*  --   --  12.5*  --   --   --   HGB 8.0* 7.9*  < > 8.2*  < > 10.5* 9.3* 9.2* 8.7* 8.6*  HCT 25.7* 25.4*  < > 28.1*  < > 31.0* 31.2* 30.8* 29.6* 29.6*  MCV 88.6 88.8  --  94.6  --   --  90.2 91.9 94.0 94.6  PLT 183 224  --  280  --   --  405* 511* 576* 659*  < > = values in this interval not displayed.  INR:  Recent Labs Lab 09/26/14 0412  09/27/14 0430 09/28/14 0530 09/29/14 0530 09/30/14 0510  INR 2.02* 1.81* 2.25* 2.47* 2.64*    Other results:  EKG:   Imaging: No results found.   Medications:     Scheduled Medications: . amiodarone  200 mg Oral BID  . aspirin EC  325 mg Oral Daily   Or  . aspirin  324 mg Per Tube Daily  . bisacodyl  10 mg Oral Daily   Or  . bisacodyl  10 mg Rectal Daily  . dextromethorphan-guaiFENesin  1 tablet Oral BID  . docusate sodium  200 mg Oral Daily  . feeding supplement (ENSURE ENLIVE)  237 mL Oral TID BM  . ferrous [id number] C-folic acid  1 capsule Oral TID PC  . gabapentin  300 mg Oral BID  . insulin aspart  0-24 Units Subcutaneous TID AC & HS  . pantoprazole  40 mg Oral Daily  . piperacillin-tazobactam (ZOSYN)  IV  3.375 g Intravenous 3 times per day  . polyethylene glycol  17 g Oral Daily  . rosuvastatin  10 mg Oral q1800  . sodium chloride  10-40 mL Intracatheter Q12H  . sodium chloride  3 mL Intravenous Q12H  . traZODone  50 mg Oral QHS  . Warfarin - Pharmacist Dosing Inpatient   Does not apply q1800    Infusions: . sodium chloride    . sodium chloride 20 mL/hr at 09/23/14 1900    PRN Medications: sodium chloride, magnesium hydroxide, ondansetron (ZOFRAN) IV, sodium chloride, sodium chloride, traMADol   Assessment:  Stable hemodynamics- , VAD speed 9400 C02 improved off BiPAP- persistent 02 requirement Wt  At baseline  Holding lasix Cont IV Zosyn for possible pneumonia, empyema from past pulmonary infarct  Plan/Discussion:      Cont with VAD teaching Increase mobility- still weak  Coumadin per pharmD goal 2.5-3 DC EPWs just before DC Add Ensure to supp diet Repeat CXR in am    I reviewed the LVAD parameters from today, and compared the results to the patient's prior recorded data.  No programming changes were made.  The LVAD is functioning within specified parameters.  The patient performs LVAD self-test daily.  LVAD interrogation was  negative for any significant power changes, alarms or PI events/speed drops.  LVAD equipment check completed and is in good working order.  Back-up  equipment present.   LVAD education done on emergency procedures and precautions and reviewed exit site care.  Length of Stay: Kirby III 09/30/2014, 8:37 AM

## 2014-09-30 NOTE — Progress Notes (Signed)
Admitted 09/10/14 with worsening heart failure and cardiogenic shock.  HeartMate II LVAD implated on 09/21/14 by Dr. Darcey Nora as DT VAD.   Vital signs: Temp:  97.4 - 98.6 HR:70 - 80, NSR Doppler MAP:  70 - 86 O2 Sat:  85 - 99% (2 LPM > 3LPM > 4LPM)  Wt in lbs:  173 > 194 > 194.7 > 192.7 > 187 > 184 >184 >182   LVAD interrogation reveals:  Speed:  9400 Flow: 5.6 Power:  6.0 PI:  6.0 Alarms: none Events: one PI event Fixed speed:  9400 Low speed limit: 8800  Blood products: OR:  1 unit PC, 2 plts, 3 FFP ICU: 09/22/14 - 1 unit PC  Nitric Oxide:  Off 09/24/14  Milinone: off 09/27/14  ASA:  325 mg started 09/22/14  Warfarin: started 09/22/14  Plavix: 09/27/14 Not restarting per Dr. Haroldine Laws    Labs:  LDH trend: 364 > 496 > 380 > 341 > 375 > 398 > 379 > 350 > 352  INR trend: 1.42 > 1.44 > 1.6 > 1.84 > 2.02 > 1.81 > 2.47 > 2.64  Co-Ox:  62.3%   WBC:  13.3 > 15.8 > 14.8 > 13.1 > 11.4 > 15.4 (Urine Culture sent) > 17.2 > 15.6 > 14.5   Hgb:  7.8 (received one unit PC) > 9.0 > 8.8 > 8.0 > 7.9 > 8.2 > 9.3> 8.7 > 8.6  Drive Line:  VAD dressing removed and site care performed using sterile technique. Sit with two sutures intact, removed outer stitch, skin closed with edges approximated. The velour is fully implanted at exit site.   Wife performed dressing change correctly using Chlora prep applicators x 2, allowed site to dry, and gauze dressing with aquacel strip re-applied. Exit site with brown/serousbloody drainage noted. No redness, tenderness, or foul odor noted. Drive line anchor re-applied. Pt denies fever or chills. Driveline dressing is being changed daily/weekly per sterile technique.      Plan/Recommendations and Discharge Needs as discussed with VAD team:  1.  Tolerating diet well--TNA off. Continue to push nutrition.   2.  Right hand pain--pain, weakness and some purple discoloration to the right thumb/forefinger/index finger. Arterial line on this side  previously. Dr. [name] started Neurontin. Mr. Polanco reports that the pain is improving slowly but still making it difficult to switch from power sources, but encouraged him to still try to participate as much as he can.   3.  O2 titrated from 2 LPM to 4 LPM over last 24 hours. Pt slept soundly form 4 pm throughout night. Awake, ate breakfast this am. Difficult to arouse mid morning, but up and walking with PT without difficulties. Walked 550 feet with one standing rest stop; unable to monitor O2 sat during walk.  4. WBC < 14.5 today, better than yesterday--urine culture negative (final) & sputum culture NGTD.    5.  Had 20 beat run VT last night; MAP 84, pt asymptomatic. Will add bmp and Mag today. Pt on Amio 200 mg bid.  6.  PT recommending home health PT and Rollator walker.   7. Planning on education session with his wife and their second caregiver Friday 10/01/14 @ 0900. Home equipment has been maintained by [employer] today; will charge batteries and being up personal equipment Friday.

## 2014-09-30 NOTE — Progress Notes (Signed)
Transported on BIPAP to 2H08 with Rapid Response team without complications.

## 2014-09-30 NOTE — Progress Notes (Signed)
Noted pts CO2. Will hold ambulation. Yves Dill CES, ACSM 2:54 PM 09/30/2014

## 2014-09-30 NOTE — Progress Notes (Signed)
Pt resting, pt oxygen saturation decreased to 85% on 3 liters of oxygen, upon waking up oxygen saturation increased to 92% on 3 liters after using incentive spirometer, educated pt on using incentive spirometer, within about 4 minutes after falling back to sleep oxygen saturation decreased to 87%, oxygen increased to 4 liters via nasal cannula, pt current oxygen saturation is now 94%, RN will continue to monitor, pt is resting, pt bed in lowest position with call bell in reach.   Fulton Mole, [place] 09/30/2014

## 2014-09-30 NOTE — Progress Notes (Signed)
ANTICOAGULATION CONSULT NOTE - Follow Up Consult  Pharmacy Consult for Warfarin Indication: LVAD  No Known Allergies  Patient Measurements: Height: 5\' 8"  (172.7 cm) Weight: 182 lb 5.1 oz (82.7 kg) (holding telemetry box and heart pump monitor) IBW/kg (Calculated) : 68.4  Vital Signs: Temp: 97.4 F (36.3 C) (03/31 0416) Temp Source: Oral (03/31 0416)  Labs:  Recent Labs  09/28/14 0530 09/29/14 0530 09/30/14 0510  HGB 9.2* 8.7* 8.6*  HCT 30.8* 29.6* 29.6*  PLT 511* 576* 659*  LABPROT 25.0* 27.0* 28.4*  INR 2.25* 2.47* 2.64*  CREATININE 0.81  --   --     Estimated Creatinine Clearance: 94 mL/min (by C-G formula based on Cr of 0.81).   Medical History: Past Medical History  Diagnosis Date  . Hyperlipidemia   . Hypertension   . Diabetes   . Nephrolithiasis   . Ankylosing spondylitis   . CAD (coronary artery disease)     a. stenting of LCx 2013; b. STEMI 06/12/14 s/p PCI to LAD complicated by post cath shock requiring IABP; VT s/p DCCV, EF 20%; c. NSTEMI 06/26/14 treated medically.  . Ischemic cardiomyopathy     a. echo 08/23/2014 EF <20%, dilated CM, mod MR/TR  . Acute on chronic respiratory failure     a. 08/2014 in setting of PE.  . Bilateral pulmonary embolism     a. 08/2014 - started on Coumadin. Retrievable IVC filter placed 08/27/14 due to RV strain and large clot burden.  . Right leg DVT     a. 08/2014.  Marland Kitchen Chronic systolic CHF (congestive heart failure)   . Hypotension   . Hemoptysis     a. 08/2014 possibly due to pulm infarct/PE.  Marland Kitchen Pleural effusion on right 08/2014 - small  . Leukocytosis   . Carotid artery disease     a. s/p stenting.  . Ventricular tachycardia     a. 06/2014 at time of MI, s/p DCCV.  Marland Kitchen Reactive thrombocytosis 09/10/2014  . Leukocytosis 09/10/2014   Assessment: 69yo male on warf PTA recent bilat PE and RLE DVT, s/p IVC filter (08/2014), admitted 3/11 with low output HF, LVAD placed 3/22, Pharmacy consulted to dose warfarin.  PMH: CAD  s/p LAD stent in 2013, DM, carotid stenting, ankylosing spondylitis, bilateral PE(08/2014), IVC filter  Coag/heme: LVAD h/o PE, IVC filter, Warfarin/plavix stopped 3/17, warf restarted 3/23, INR labile, now remains therapeutic, H&H low, slight trend down, pocket drain out, amio dose titrating, LDH trend down 496>>350  Will not resume Plavix as anterior wall completely necrotic.  On oral iron, PTA Warfarin 2mg  Mon and Fri, 4mg  all other days Suspect warfarin needs will likely change d/t new start amio as well as improved perfusion w/ LVAD.  Goal of Therapy:  INR 2-3 Monitor platelets by anticoagulation protocol: Yes   Plan:  Warfarin 2mg  x1 Daily INR, s/sx of bleeding  Thank you for allowing pharmacy to be a part of this patient's care team.  Drucie Opitz, PharmD Clinical Pharmacy Resident Pager: [phone] 09/30/2014 11:32 AM

## 2014-10-01 ENCOUNTER — Inpatient Hospital Stay (HOSPITAL_COMMUNITY): Payer: Medicare Other

## 2014-10-01 DIAGNOSIS — I5022 Chronic systolic (congestive) heart failure: Secondary | ICD-10-CM

## 2014-10-01 DIAGNOSIS — J9622 Acute and chronic respiratory failure with hypercapnia: Secondary | ICD-10-CM

## 2014-10-01 LAB — GLUCOSE, CAPILLARY
GLUCOSE-CAPILLARY: 114 mg/dL — AB (ref 70–99)
Glucose-Capillary: 121 mg/dL — ABNORMAL HIGH (ref 70–99)
Glucose-Capillary: 141 mg/dL — ABNORMAL HIGH (ref 70–99)

## 2014-10-01 LAB — COMPREHENSIVE METABOLIC PANEL
ALBUMIN: 2.4 g/dL — AB (ref 3.5–5.2)
ALK PHOS: 266 U/L — AB (ref 39–117)
ALT: 59 U/L — ABNORMAL HIGH (ref 0–53)
AST: 51 U/L — AB (ref 0–37)
Anion gap: 7 (ref 5–15)
BUN: 10 mg/dL (ref 6–23)
CHLORIDE: 94 mmol/L — AB (ref 96–112)
CO2: 39 mmol/L — ABNORMAL HIGH (ref 19–32)
Calcium: 8.6 mg/dL (ref 8.4–10.5)
Creatinine, Ser: 0.81 mg/dL (ref 0.50–1.35)
GFR calc Af Amer: >90 mL/min (ref >90)
GFR calc non Af Amer: >90 mL/min (ref >90)
Glucose, Bld: 117 mg/dL — ABNORMAL HIGH (ref 70–99)
POTASSIUM: 4.3 mmol/L (ref 3.5–5.1)
Sodium: 140 mmol/L (ref 135–145)
Total Bilirubin: 1 mg/dL (ref 0.3–1.2)
Total Protein: 6.9 g/dL (ref 6.0–8.3)

## 2014-10-01 LAB — CBC
HCT: 31.9 % — ABNORMAL LOW (ref 39.0–52.0)
HEMOGLOBIN: 8.9 g/dL — AB (ref 13.0–17.0)
MCH: 27 pg (ref 26.0–34.0)
MCHC: 27.9 g/dL — AB (ref 30.0–36.0)
MCV: 96.7 fL (ref 78.0–100.0)
Platelets: 812 10*3/uL — ABNORMAL HIGH (ref 150–400)
RBC: 3.3 MIL/uL — AB (ref 4.22–5.81)
RDW: 17.9 % — ABNORMAL HIGH (ref 11.5–15.5)
WBC: 15.3 10*3/uL — ABNORMAL HIGH (ref 4.0–10.5)

## 2014-10-01 LAB — POCT I-STAT 3, ART BLOOD GAS (G3+)
ACID-BASE EXCESS: 17 mmol/L — AB (ref 0.0–2.0)
BICARBONATE: 45.6 meq/L — AB (ref 20.0–24.0)
O2 Saturation: 99 %
PO2 ART: 142 mmHg — AB (ref 80.0–100.0)
TCO2: 48 mmol/L (ref 0–100)
pCO2 arterial: 79.8 mmHg (ref 35.0–45.0)
pH, Arterial: 7.366 (ref 7.350–7.450)

## 2014-10-01 LAB — PROTIME-INR
INR: 2.37 — ABNORMAL HIGH (ref 0.00–1.49)
PROTHROMBIN TIME: 26.1 s — AB (ref 11.6–15.2)

## 2014-10-01 LAB — LACTATE DEHYDROGENASE: LDH: 351 U/L — ABNORMAL HIGH (ref 94–250)

## 2014-10-01 MED ORDER — WARFARIN SODIUM 4 MG PO TABS
4.0000 mg | ORAL_TABLET | Freq: Once | ORAL | Status: AC
Start: 2014-10-01 — End: 2014-10-01
  Administered 2014-10-01: 4 mg via ORAL
  Filled 2014-10-01: qty 1

## 2014-10-01 NOTE — Progress Notes (Signed)
Admitted 09/10/14 with worsening heart failure and cardiogenic shock.  HeartMate II LVAD implated on 09/21/14 by Dr. Darcey Nora as DT VAD.   Vital signs: Temp:  97.4 - 98.6 HR:70 - 80, NSR Doppler MAP:  70 - 86 O2 Sat:  85 - 99% (2 LPM > 3LPM > 4LPM)  Wt in lbs:  173 > 194 > 194.7 > 192.7 > 187 > 184 >184 > 182   LVAD interrogation reveals:  Speed:  9400 Flow: 6.0 Power:  6.0 PI:  6.3 Alarms: none Events: one PI event Fixed speed:  9400 Low speed limit: 8800  Blood products: OR:  1 unit PC, 2 plts, 3 FFP ICU: 09/22/14 - 1 unit PC  Nitric Oxide:  Off 09/24/14  Milinone: off 09/27/14  ASA:  325 mg started 09/22/14  Warfarin: started 09/22/14  Plavix: 09/27/14 Not restarting per Dr. Haroldine Laws    Labs:  LDH trend: 364 > 496 > 380 > 341 > 375 > 398 > 379 > 350 > 352  INR trend: 1.42 > 1.44 > 1.6 > 1.84 > 2.02 > 1.81 > 2.47 > 2.64  Co-Ox:  62.3%   WBC:  13.3 > 15.8 > 14.8 > 13.1 > 11.4 > 15.4 (Urine Culture sent) > 17.2 > 15.6 > 14.5   Hgb:  7.8 (received one unit PC) > 9.0 > 8.8 > 8.0 > 7.9 > 8.2 > 9.3> 8.7 > 8.6  Drive Line:  VAD dressing removed and site care performed using sterile technique. Sit with two sutures intact, removed outer stitch, skin closed with edges approximated. The velour is fully implanted at exit site.   Scott [name] (Back up caregiver) performed dressing change correctly under my supervision using Chlora prep applicators x 2, allowed site to dry, and gauze dressing with aquacel strip re-applied. Exit site with scant brown/serosanguinous drainage noted (less than previous day). Pink at the site with some bloody drainage noted with cleaning. No overt redness, tenderness, or foul odor noted. Drive line anchor re-applied. Pt denies fever or chills. Driveline dressing is being changed daily/weekly per sterile technique.           Plan/Recommendations and Discharge Needs as discussed with VAD team:  1.  Tolerating diet well. Has had BM's with current  regimen.    2.  Right hand pain--pain, weakness and some purple discoloration to the right thumb/forefinger/index finger. Arterial line on this side previously. Dr. [name] started Neurontin. Scott [name] reports that the pain is improving slowly but still making it difficult to switch from power sources, but encouraged him to still try to participate as much as he can.   3.  O2 titrated from 2 LPM to 4 LPM over last 24 hours. Requiring 6 L with ambulation. Much more awake this morning off bipap and ate breakfast this am well.   4. WBC 15.3 today, sl elevated compared to yesterday--urine culture negative (final) & sputum culture NGTD.    5.  No VT reported.   6.  PT recommending home health PT and Rollator walker.   7. See VAD education note for details regarding session today. Wife to continue to perform daily dressing change with staff support/supervision.

## 2014-10-01 NOTE — Progress Notes (Signed)
ANTICOAGULATION CONSULT NOTE - Follow Up Consult  Pharmacy Consult for Warfarin Indication: LVAD  No Known Allergies  Patient Measurements: Height: 5\' 8"  (172.7 cm) Weight: 188 lb 0.8 oz (85.3 kg) IBW/kg (Calculated) : 68.4  Vital Signs: Temp: 97.6 F (36.4 C) (04/01 0800) Temp Source: Oral (04/01 0800) Pulse Rate: 72 (04/01 0841)  Labs:  Recent Labs  09/29/14 0530 09/30/14 0510 09/30/14 1150 10/01/14 0401  HGB 8.7* 8.6*  --  8.9*  HCT 29.6* 29.6*  --  31.9*  PLT 576* 659*  --  812*  LABPROT 27.0* 28.4*  --  26.1*  INR 2.47* 2.64*  --  2.37*  CREATININE  --   --  0.76 0.81    Estimated Creatinine Clearance: 95.4 mL/min (by C-G formula based on Cr of 0.81).   Medical History: Past Medical History  Diagnosis Date  . Hyperlipidemia   . Hypertension   . Diabetes   . Nephrolithiasis   . Ankylosing spondylitis   . CAD (coronary artery disease)     a. stenting of LCx 2013; b. STEMI 06/12/14 s/p PCI to LAD complicated by post cath shock requiring IABP; VT s/p DCCV, EF 20%; c. NSTEMI 06/26/14 treated medically.  . Ischemic cardiomyopathy     a. echo 08/23/2014 EF <20%, dilated CM, mod MR/TR  . Acute on chronic respiratory failure     a. 08/2014 in setting of PE.  . Bilateral pulmonary embolism     a. 08/2014 - started on Coumadin. Retrievable IVC filter placed 08/27/14 due to RV strain and large clot burden.  . Right leg DVT     a. 08/2014.  Marland Kitchen Chronic systolic CHF (congestive heart failure)   . Hypotension   . Hemoptysis     a. 08/2014 possibly due to pulm infarct/PE.  Marland Kitchen Pleural effusion on right 08/2014 - small  . Leukocytosis   . Carotid artery disease     a. s/p stenting.  . Ventricular tachycardia     a. 06/2014 at time of MI, s/p DCCV.  Marland Kitchen Reactive thrombocytosis 09/10/2014  . Leukocytosis 09/10/2014   Assessment: 69yo male on warf PTA recent bilat PE and RLE DVT, s/p IVC filter (08/2014), admitted 3/11 with low output HF, LVAD placed 3/22, Pharmacy consulted to  dose warfarin.  PMH: CAD s/p LAD stent in 2013, DM, carotid stenting, ankylosing spondylitis, bilateral PE(08/2014), IVC filter  Coag/heme: LVAD 3/22 h/o PE 2/16 with IVC filter, s/p MI with PCI LAD 12/15 Warfarin/plavix stopped 3/17 preop - Will not resume Plavix as anterior wall completely necrotic,  warf restarted 3/23, INR remains therapeutic, H&H low stable -On oral iron, pocket drain out, amiodarone po new for post op Afib > SR, LDH trend down 496>>350  Transferred to ICU 3/31 with lethargy placed on BiPap   PTA Warfarin 2mg  Mon and Fri, 4mg  all other days Suspect warfarin needs will likely change d/t new start amio as well as improved perfusion w/ LVAD.  Goal of Therapy:  INR 2-3 Monitor platelets by anticoagulation protocol: Yes   Plan:  Warfarin 4 mg x1 Daily INR, s/sx of bleeding   Bonnita Nasuti Pharm.D. CPP, BCPS Clinical Pharmacist [phone] 10/01/2014 9:38 AM

## 2014-10-01 NOTE — Progress Notes (Signed)
    VAD Teaching Note:   Patient along with Fredricka Bonine (primary caregiver) and Donnamae Jude (neighbor/secondary caregiver) have received VAD teaching and education and verbalize the understanding of the HM II system operation. Mrs. Ledesma has taken the education verification test and passed. We will need to work with Mr. Venuto prior to discharge to complete the test once he is closer to discharge. Discharge date is unknown at this time due to having to return to the ICU for BiPAP. Much improved today and he was able to participate, interact and ask appropriate questions. Continue to allow more opportunities for Mr. Sylla to change connections more often and participate in the equipment as much as possible.   The patient's wife and neighbor have been trained on maintaining the drive line and sterile technique; they have both performed the dressing changes under my supervision adequately with minimal instruction. I have reviewed the daily flow sheet that the patient will record his VAD parameters, weights and driveline dressing status with patient and family.    Patient and his support team have had extensive education about:  1) Who to call for a VAD emergency.  2) What is considered a VAD emergency.  3) What back-up equipment needs to be with him at  all times.  4) How to take care of his driveline and equipment.  5) How to change his system controller.  6) The restrictions he has while living with the pump.  7) How to change power sources.  8) What the critical and advisory alarms mean and the follow-up responses.   Mrs. Lemma feels she is comfortable with providing the necessary care for the VAD system for her husband and understands all requirements for maintenance. Will continue to offer teaching to Mr. Goldsborough with support from the nursing staff and his wife as he improves and gets out of ICU.   Discharge information materials given to patient include the VAD  Resource Booklet, pamphlets re: Power Module maintenance, Alarms for Patients/Caregivers,   All questions have been answered throughout the session. Both caregivers and the patient have performed a system controller exchange with the patient's back up system controller and my mock controller.   Ongoing Education:   1. Will need to discuss warfarin maintenance with dietary/lifestyle changes next week with the two of them.   2. With the support of VAD team and nursing staff, continue to allow Mrs. Bratz to perform daily supervised dressing changes.  3. As Mr. Moorer hand improves in strength and pain, continue to allow him to change power sources.   4. Need to complete patient's competency test.   We will continue to follow up with he and his wife through D/C and bring patient equipment when he moves out of the ICU next week.     Janene Madeira, RN VAD Coordinator   Office: [phone] 24/7 VAD Pager: [phone]

## 2014-10-01 NOTE — Progress Notes (Signed)
   10/01/14 1400  Clinical Encounter Type  Visited With Patient and family together;Health care provider  Visit Type Initial;Psychological support;Spiritual support;Critical Care   Chaplain visited with patient this morning while rounding on the unit. Patient was being visited by his wife. Patient identifies as Sherryll Burger and said his pastor visited with him yesterday. Chaplain affirmed the spiritual support the patient already has and offered his support if the patient ever wanted to talk further in the future. Chaplain will continue to provide emotional and spiritual support for patient and patient's family as needed.  Crimson Beer, Claudius Sis, Chaplain  2:20 PM

## 2014-10-01 NOTE — Progress Notes (Signed)
HeartMate 2 Rounding Note  Subjective:    Post Day 10 - HMII LVAD   Moved to ICU last night for recurrent CO2 retention. Started on BIPAP with improvement then weaned off. Refused BIPAP overnight. This am, more lethargic. Back on Bipap.   Otherwise feels ok..  On Zosyn. WBC falling. UA +  Ucx no growth.   LVAD INTERROGATION:  HeartMate II LVAD:  Flow 6.5 liters/min, speed 9400 power 6.0, PI 4.0   No PI events.  Objective:    Vital Signs:   Temp:  [97.2 F (36.2 C)-98.2 F (36.8 C)] 97.6 F (36.4 C) (04/01 0800) Pulse Rate:  [62-81] 72 (04/01 0841) Resp:  [13-24] 13 (04/01 0841) SpO2:  [88 %-100 %] 88 % (04/01 0841) FiO2 (%):  [30 %] 30 % (04/01 0841) Weight:  [85.3 kg (188 lb 0.8 oz)] 85.3 kg (188 lb 0.8 oz) (04/01 0421) Last BM Date: 09/29/14 Mean arterial Pressure  80-94  Intake/Output:   Intake/Output Summary (Last 24 hours) at 10/01/14 0935 Last data filed at 10/01/14 0800  Gross per 24 hour  Intake    160 ml  Output   1051 ml  Net   -891 ml     Physical Exam: General:  sitting in bed. On bipap lethargic but awakens to voice HEENT: normal Neck: supple. Carotids 2+ bilat; no bruits. No lymphadenopathy or thryomegaly appreciated.  Cor: Mechanical heart sounds with LVAD hum present.  Lungs: clear Abdomen: soft, nontender, nondistended. No hepatosplenomegaly. No bruits or masses. Good bowel sounds. Driveline: Dressing looks good.  Extremities: no cyanosis, clubbing, rash, no edema R hand weak. R index finger slightly mottled. Sensation intact Neuro: Lethargic but awakens and follows commands.  Telemetry: NSR 80-90s   Labs: Basic Metabolic Panel:  Recent Labs Lab 09/25/14 0410  09/26/14 0412  09/26/14 1713 09/27/14 0430 09/28/14 0530 09/30/14 1150 10/01/14 0401  NA 133*  < > 131*  < > 136 138 137 140 140  K 4.0  < > 4.2  < > 3.7 3.6 4.1 4.5 4.3  CL 92*  < > 90*  < > 89* 90* 93* 92* 94*  CO2 32  --  38*  --   --  39* 39* 38* 39*  GLUCOSE 115*  < > 487*   < > 128* 105* 98 131* 117*  BUN 41*  < > 35*  < > 32* 27* 19 12 10   CREATININE 1.22  < > 1.11  < > 1.10 0.97 0.81 0.76 0.81  CALCIUM 8.5  --  8.1*  --   --  8.2* 8.4 8.7 8.6  MG 2.2  --  2.3  --   --  1.9 2.1 2.1  --   PHOS 3.6  --  4.5  --   --   --   --   --   --   < > = values in this interval not displayed.  Liver Function Tests:  Recent Labs Lab 09/25/14 0410 09/26/14 0412 09/27/14 0430 09/28/14 0530 10/01/14 0401  AST 101* 97* 81* 64* 51*  ALT 135* 120* 96* 77* 59*  ALKPHOS 136* 155* 172* 182* 266*  BILITOT 1.6* 1.3* 1.5* 1.0 1.0  PROT 5.2* 6.0 5.7* 5.8* 6.9  ALBUMIN 2.4* 2.4* 2.2* 2.1* 2.4*   No results for input(s): LIPASE, AMYLASE in the last 168 hours.  Recent Labs Lab 09/30/14 1445  AMMONIA 46*    CBC:  Recent Labs Lab 09/25/14 0410  09/26/14 0412  09/27/14 0430 09/28/14 0530  09/29/14 0530 09/30/14 0510 10/01/14 0401  WBC 13.1*  --  11.4*  --  15.4* 17.2* 15.6* 14.5* 15.3*  NEUTROABS 11.0*  --  9.4*  --  12.5*  --   --   --   --   HGB 7.9*  < > 8.2*  < > 9.3* 9.2* 8.7* 8.6* 8.9*  HCT 25.4*  < > 28.1*  < > 31.2* 30.8* 29.6* 29.6* 31.9*  MCV 88.8  --  94.6  --  90.2 91.9 94.0 94.6 96.7  PLT 224  --  280  --  405* 511* 576* 659* 812*  < > = values in this interval not displayed.  INR:  Recent Labs Lab 09/27/14 0430 09/28/14 0530 09/29/14 0530 09/30/14 0510 10/01/14 0401  INR 1.81* 2.25* 2.47* 2.64* 2.37*    Other results:    Imaging: Dg Chest 2 View  10/01/2014   CLINICAL DATA:  Status post placement of left ventricular assist device  EXAM: CHEST  2 VIEW  COMPARISON:  PA and lateral chest of September 28, 2014  FINDINGS: The lungs are less well inflated today. There has been interval development of left lower lobe atelectasis. A small left pleural effusion persists. On the right there is persistent infiltrate in the right middle lobe. The cardiopericardial silhouette remains enlarged. The central pulmonary vascularity is less distinct and there  is mild pulmonary edema. There are 7 intact sternal wires. The left ventricular assist device is unchanged in position. The right-sided PICC line tip projects over the distal portion of the SVC.  IMPRESSION: Interval deterioration in the appearance of the chest with interval development of left lower lobe atelectasis, mild progression in right middle lobe atelectasis/pneumonia, and development of mild pulmonary interstitial edema.   Electronically Signed   By: David  Martinique   On: 10/01/2014 07:58     Medications:     Scheduled Medications: . amiodarone  200 mg Oral BID  . aspirin EC  325 mg Oral Daily  . bisacodyl  10 mg Oral Daily  . dextromethorphan-guaiFENesin  1 tablet Oral BID  . docusate sodium  200 mg Oral Daily  . feeding supplement (ENSURE ENLIVE)  237 mL Oral TID BM  . ferrous CHYIFOYD-X41-OINOMVE C-folic acid  1 capsule Oral TID PC  . gabapentin  300 mg Oral BID  . insulin aspart  0-24 Units Subcutaneous TID AC & HS  . pantoprazole  40 mg Oral Daily  . piperacillin-tazobactam (ZOSYN)  IV  3.375 g Intravenous 3 times per day  . polyethylene glycol  17 g Oral Daily  . rosuvastatin  10 mg Oral q1800  . sodium chloride  10-40 mL Intracatheter Q12H  . sodium chloride  3 mL Intravenous Q12H  . traZODone  50 mg Oral QHS  . warfarin  4 mg Oral ONCE-1800  . Warfarin - Pharmacist Dosing Inpatient   Does not apply q1800    Infusions: . sodium chloride    . sodium chloride 20 mL/hr at 09/23/14 1900    PRN Medications: sodium chloride, magnesium hydroxide, ondansetron (ZOFRAN) IV, sodium chloride, sodium chloride, traMADol   Assessment:    1.  A/C systolic HF class IV with cardiogenic shock- EF 15% with moderate RV dysfunction 3/16 echo.         -S/P HMII LVAD 09/21/14  2.  CAD s/p Anterior STEMI on 06/12/14 with stenting of LAD- on coumadin + plavix, statin.  3. Acute on chronic hypercarbic respiratory failure  4.  06/12/14 VT in setting of  STEMI 5.  Ankylosing  spondylitis. 6. Bilateral PE with R DVT- S/P IVC filter 08/27/2014--->coumadin 7. Pleural effusion, transudative  8. ?PNA 9. Acute Blood Loss- Anemia- Received 1UPRBCs 3/23  Plan/Discussion:    POD #10 HMII LVAD.   Doing well except for recurrent hypercarbic respiratory issues. Improves with Bipap. Discussed with Dr. [name]. Will have him use bipap every night and then try to keep him off during the day. Encourage ambulation and incentive spirometry. Will continue Bipap until noon today and then stop. Check ABG 1 hour after.   Continue Zosyn for 7 day course.   Weight currently stable off diuretics. Co-ox and ammonia ok.  Maintaining NSR on po amio.   On coumadin INR 2.4  Will not restart Plavix as anterior wall completely necrotic.    Ramp echo when improved. Continue 9400 for now  I reviewed the LVAD parameters from today, and compared the results to the patient's prior recorded data.  No programming changes were made.  The LVAD is functioning within specified parameters.  The patient performs LVAD self-test daily.  LVAD interrogation was negative for any significant power changes, alarms or PI events/speed drops.  LVAD equipment check completed and is in good working order.  Back-up equipment present.   LVAD education done on emergency procedures and precautions and reviewed exit site care.  Length of Stay: 21  [name]  MD  10/01/2014, 9:35 AM  VAD Team --- VAD ISSUES ONLY--- Pager [phone] (7am - 7am)  Advanced Heart Failure Team  Pager [phone] (M-F; 7a - 4p)  Please contact [facility] for night-coverage after hours (4p -7a ) and weekends on [email or web address]

## 2014-10-01 NOTE — Progress Notes (Signed)
PULMONARY / CRITICAL CARE MEDICINE   Name: JAQUA CHING MRN: [id number] DOB: 17-Apr-1946    ADMISSION DATE:  09/10/2014 CONSULTATION DATE:  Initial - 3/18.Marland Kitchen Re-consulted 3/31  REFERRING MD :  Bensimhon   CHIEF COMPLAINT:  Respiratory failure   INITIAL PRESENTATION:  69 y.o. M with extensive cardiac hx as well as recent PE / DVT, admitted for SOB due to heart failure, cardiogenic, shock, PE. Thought to be good candidate for VAD. Found to have mod - large R pleural effusion, PCCM initially consulted 3/18 for thoracentesis as part of optimization prior to VAD. Pt ultimately had VAD placed 3/23.  Was progressing well post op until 3/31 when he developed progressive AMS.  Tx to ICU and PCCM consulted.   STUDIES:  LE dopplers from OSH: Right leg DVT  CT Chest from OSH: bilateral diffuse PEs w/o overt signs of right heart strain ECHO 12/12 >>EF 20%; mid Lventricle and apex is akinetic ECHO 12/21 >> Echo showed EF <20%, severely decreased global systolic LV function, dilated CM, moderate MR/TR  SIGNIFICANT EVENTS: 3/11 admit  3/18 thoracentesis - exudative effusion, likely parapneumonic  3/23 VAD placement  3/31 acute hypercarbic respiratory failure   SUBJECTIVE:  Awake and currently off nimvs   VITAL SIGNS: Temp:  [97.2 F (36.2 C)-98.2 F (36.8 C)] 97.6 F (36.4 C) (04/01 0800) Pulse Rate:  [62-81] 72 (04/01 0841) Resp:  [13-24] 13 (04/01 0841) SpO2:  [88 %-100 %] 88 % (04/01 0841) FiO2 (%):  [30 %] 30 % (04/01 0841) Weight:  [188 lb 0.8 oz (85.3 kg)] 188 lb 0.8 oz (85.3 kg) (04/01 0421) HEMODYNAMICS:   VENTILATOR SETTINGS: Vent Mode:  [-]  FiO2 (%):  [30 %] 30 % INTAKE / OUTPUT:  Intake/Output Summary (Last 24 hours) at 10/01/14 1034 Last data filed at 10/01/14 0800  Gross per 24 hour  Intake    160 ml  Output   1051 ml  Net   -891 ml    PHYSICAL EXAMINATION: General:  acutely ill appearing male Neuro:  Awake and interactive  HEENT:  Mm moist, off  bipap Cardiovascular:  Mechanical heart sounds with VAD Lungs:  resps even, non labored, slightly tachypneic on bipap, few scattered rhonchi  Abdomen:  Soft, non tender, dressings c/d  Musculoskeletal:  Pale, cool, dry, scant BLE edema   LABS:  CBC  Recent Labs Lab 09/29/14 0530 09/30/14 0510 10/01/14 0401  WBC 15.6* 14.5* 15.3*  HGB 8.7* 8.6* 8.9*  HCT 29.6* 29.6* 31.9*  PLT 576* 659* 812*   Coag's  Recent Labs Lab 09/29/14 0530 09/30/14 0510 10/01/14 0401  INR 2.47* 2.64* 2.37*   BMET  Recent Labs Lab 09/28/14 0530 09/30/14 1150 10/01/14 0401  NA 137 140 140  K 4.1 4.5 4.3  CL 93* 92* 94*  CO2 39* 38* 39*  BUN 19 12 10   CREATININE 0.81 0.76 0.81  GLUCOSE 98 131* 117*   Electrolytes  Recent Labs Lab 09/25/14 0410 09/26/14 0412 09/27/14 0430 09/28/14 0530 09/30/14 1150 10/01/14 0401  CALCIUM 8.5 8.1* 8.2* 8.4 8.7 8.6  MG 2.2 2.3 1.9 2.1 2.1  --   PHOS 3.6 4.5  --   --   --   --    Sepsis Markers No results for input(s): LATICACIDVEN, PROCALCITON, O2SATVEN in the last 168 hours. ABG  Recent Labs Lab 09/25/14 0420 09/30/14 1436 09/30/14 1548  PHART 7.364 7.218* 7.324*  PCO2ART 55.3* 100.0* 79.3*  PO2ART 132.0* 193.0* 85.6   Liver Enzymes  Recent  Labs Lab 09/27/14 0430 09/28/14 0530 10/01/14 0401  AST 81* 64* 51*  ALT 96* 77* 59*  ALKPHOS 172* 182* 266*  BILITOT 1.5* 1.0 1.0  ALBUMIN 2.2* 2.1* 2.4*   Cardiac Enzymes No results for input(s): TROPONINI, PROBNP in the last 168 hours. Glucose  Recent Labs Lab 09/29/14 1647 09/29/14 2106 09/30/14 0526 09/30/14 1114 09/30/14 1720 09/30/14 2215  GLUCAP 99 140* 102* 117* 115* 107*    Imaging No results found.   ASSESSMENT / PLAN:  PULMONARY Acute hypercarbic respiratory failure, improved after BiPAP Known PE  Pleural effusions  P:   Bipap prn F/u ABG  F/u CXR  Avoid oversedation  Supplemental O2 as needed   CARDIOVASCULAR CVL RUE PICC  Acute on chronic sCHF -  class IV with cardiogenic shock. EF 15%, now s/p LVAD  S/p LVAD placement 3/23 CAD Bilat PE/DVT  LV thrombus  P:  Per Cards  Now off milrinone, lasix  Cont amiodarone  Coumadin   RENAL Acute on chronic renal failure - resolved P:   F/u chem   GASTROINTESTINAL Hyperammonemia  P:   F/u LFT's  Consider lactulose   HEMATOLOGIC Leukocytosis  Anemia - mild  P:  Cont coumadin per pharmacy  Abx as below   INFECTIOUS UTI  P:   Urine 3/28>>> neg  Zosyn 3/29>>>  ENDOCRINE Hyperglycemia  P:   SSI   NEUROLOGIC AMS - in setting significant hypercarbia.  Improving with bipap.  P:   Cont bipap as needed Supportive care  Consider lactulose in setting hyperammonemia if continued AMS    Baltazar Apo, MD, PhD 10/01/2014, 1:54 PM [facility] [phone] or if no answer [phone]

## 2014-10-01 NOTE — Clinical Social Work Note (Signed)
Pt not ready for DC at this time-  VAD social worker will continue to follow.  Domenica Reamer, [place] Social Worker [phone]

## 2014-10-01 NOTE — Progress Notes (Signed)
CARDIAC REHAB PHASE I   PRE:  Rate/Rhythm: 79 SR    BP: sitting 80    SaO2: 100 5L  MODE:  Ambulation: 390 ft   POST:  Rate/Rhythm: 74    BP: sitting 80     SaO2: 97 6L  Pt able to get out of bed with mod assist scooting. Used rollator and 6L O2 to walk 390 ft, assist x2 for equipment. Sat x1 for rest at half way. Sts he was starting to get "fuzzy headed" in his thinking when he sat. Pt focused on slow walking and pursed lip breathing, trying to expel CO2.  Rest standing x1 on return trip. To [facility] with success then recliner. No major c/o. Using right hand more today although allowed his wife to work with the batteries. Will f/u tomorrow. [place], [place], ACSM 10/01/2014 3:32 PM

## 2014-10-01 NOTE — Progress Notes (Signed)
HeartMate 2 Rounding Note  Subjective:   POD #10   VAD implant  (DT)  For acute /chronic systolic HF from ischemic cardiomyopathy and cardiogenic shock on preop IABP  Recent PE- with large R pulmonary infarct,necrotic    lung/pneumonia- poss empyema Restrictive lung disease- FEV1 1.3 from ankylosing spondylitis DM  HX ventricular, atrial arrhythmia Postoperative CO2 retention requiring BiPAP-transferred to ICU for hypercarbia Was taken off BIPAP last night and early this am became lethargic and facid- now back on BIPAP- plan to use BIPAP EVERY NIGHT and off during day for meals and PT-ambulation     VAD flows and PI improved after lasix stopped-no PI events in last 24 hrs Ileus resolved-  TNA stopped, reglan stopped Maintaining NSR on po amiodarone Pocket drainout      Lower sternal incision continues  with no drainage - cont betadine swab and dry dressing Pump parameters are satisfactory. PI is 5 today off milrinone but lower on BIPAP MAP now 85-90    INR slowly rising - now 2.4 - coumadin per PharmD   HeartMate II LVAD:  Flow 5.5 liters/min, speed 9400 rpm power 5.1, PI  4.0  Controller intact  Objective:    Vital Signs:   Temp:  [97.2 F (36.2 C)-98.2 F (36.8 C)] 97.6 F (36.4 C) (04/01 0800) Pulse Rate:  [62-81] 72 (04/01 0841) Resp:  [13-24] 13 (04/01 0841) SpO2:  [88 %-100 %] 88 % (04/01 0841) FiO2 (%):  [30 %] 30 % (04/01 0841) Weight:  [188 lb 0.8 oz (85.3 kg)] 188 lb 0.8 oz (85.3 kg) (04/01 0421) Last BM Date: 09/29/14 Mean arterial Pressure 75  Intake/Output:   Intake/Output Summary (Last 24 hours) at 10/01/14 0846 Last data filed at 10/01/14 0800  Gross per 24 hour  Intake    160 ml  Output   1051 ml  Net   -891 ml     Physical Exam: General:  Sleepy on BIPAP HEENT: normal Neck: supple. JVP . Carotids 2+ bilat; no bruits. No lymphadenopathy or thryomegaly appreciated. Cor: Mechanical heart sounds with LVAD hum present. Lungs: currently on  BIPAP Abdomen: soft, nontender, nondistended. No hepatosplenomegaly. No bruits or masses. Good bowel sounds. Driveline: C/D/I; securement device intact and driveline incorporated Extremities: warm well perfused,no cyanosis, clubbing, rash, edema- IABP out without hematoma and groin- c/o pain in R middle finger - prob related to a-line Neuro: moves all extremities Sternal incision dry   Telemetry:nsr  Labs: Basic Metabolic Panel:  Recent Labs Lab 09/25/14 0410  09/26/14 0412  09/26/14 1713 09/27/14 0430 09/28/14 0530 09/30/14 1150 10/01/14 0401  NA 133*  < > 131*  < > 136 138 137 140 140  K 4.0  < > 4.2  < > 3.7 3.6 4.1 4.5 4.3  CL 92*  < > 90*  < > 89* 90* 93* 92* 94*  CO2 32  --  38*  --   --  39* 39* 38* 39*  GLUCOSE 115*  < > 487*  < > 128* 105* 98 131* 117*  BUN 41*  < > 35*  < > 32* 27* 19 12 10   CREATININE 1.22  < > 1.11  < > 1.10 0.97 0.81 0.76 0.81  CALCIUM 8.5  --  8.1*  --   --  8.2* 8.4 8.7 8.6  MG 2.2  --  2.3  --   --  1.9 2.1 2.1  --   PHOS 3.6  --  4.5  --   --   --   --   --   --   < > =  values in this interval not displayed.  Liver Function Tests:  Recent Labs Lab 09/25/14 0410 09/26/14 0412 09/27/14 0430 09/28/14 0530 10/01/14 0401  AST 101* 97* 81* 64* 51*  ALT 135* 120* 96* 77* 59*  ALKPHOS 136* 155* 172* 182* 266*  BILITOT 1.6* 1.3* 1.5* 1.0 1.0  PROT 5.2* 6.0 5.7* 5.8* 6.9  ALBUMIN 2.4* 2.4* 2.2* 2.1* 2.4*   No results for input(s): LIPASE, AMYLASE in the last 168 hours.  Recent Labs Lab 09/30/14 1445  AMMONIA 46*    CBC:  Recent Labs Lab 09/25/14 0410  09/26/14 0412  09/27/14 0430 09/28/14 0530 09/29/14 0530 09/30/14 0510 10/01/14 0401  WBC 13.1*  --  11.4*  --  15.4* 17.2* 15.6* 14.5* 15.3*  NEUTROABS 11.0*  --  9.4*  --  12.5*  --   --   --   --   HGB 7.9*  < > 8.2*  < > 9.3* 9.2* 8.7* 8.6* 8.9*  HCT 25.4*  < > 28.1*  < > 31.2* 30.8* 29.6* 29.6* 31.9*  MCV 88.8  --  94.6  --  90.2 91.9 94.0 94.6 96.7  PLT 224  --  280   --  405* 511* 576* 659* 812*  < > = values in this interval not displayed.  INR:  Recent Labs Lab 09/27/14 0430 09/28/14 0530 09/29/14 0530 09/30/14 0510 10/01/14 0401  INR 1.81* 2.25* 2.47* 2.64* 2.37*    Other results:  EKG:   Imaging: Dg Chest 2 View  10/01/2014   CLINICAL DATA:  Status post placement of left ventricular assist device  EXAM: CHEST  2 VIEW  COMPARISON:  PA and lateral chest of September 28, 2014  FINDINGS: The lungs are less well inflated today. There has been interval development of left lower lobe atelectasis. A small left pleural effusion persists. On the right there is persistent infiltrate in the right middle lobe. The cardiopericardial silhouette remains enlarged. The central pulmonary vascularity is less distinct and there is mild pulmonary edema. There are 7 intact sternal wires. The left ventricular assist device is unchanged in position. The right-sided PICC line tip projects over the distal portion of the SVC.  IMPRESSION: Interval deterioration in the appearance of the chest with interval development of left lower lobe atelectasis, mild progression in right middle lobe atelectasis/pneumonia, and development of mild pulmonary interstitial edema.   Electronically Signed   By: David  Martinique   On: 10/01/2014 07:58     Medications:     Scheduled Medications: . amiodarone  200 mg Oral BID  . aspirin EC  325 mg Oral Daily  . bisacodyl  10 mg Oral Daily  . dextromethorphan-guaiFENesin  1 tablet Oral BID  . docusate sodium  200 mg Oral Daily  . feeding supplement (ENSURE ENLIVE)  237 mL Oral TID BM  . ferrous [id number] C-folic acid  1 capsule Oral TID PC  . gabapentin  300 mg Oral BID  . insulin aspart  0-24 Units Subcutaneous TID AC & HS  . pantoprazole  40 mg Oral Daily  . piperacillin-tazobactam (ZOSYN)  IV  3.375 g Intravenous 3 times per day  . polyethylene glycol  17 g Oral Daily  . rosuvastatin  10 mg Oral q1800  . sodium chloride  10-40 mL  Intracatheter Q12H  . sodium chloride  3 mL Intravenous Q12H  . traZODone  50 mg Oral QHS  . Warfarin - Pharmacist Dosing Inpatient   Does not apply q1800    Infusions: .  sodium chloride    . sodium chloride 20 mL/hr at 09/23/14 1900    PRN Medications: sodium chloride, magnesium hydroxide, ondansetron (ZOFRAN) IV, sodium chloride, sodium chloride, traMADol   Assessment:  Stable hemodynamics- , VAD speed 9400 C02 improved off BiPAP- persistent 02 requirement Wt  At baseline  Holding lasix Cont IV Zosyn for possible pneumonia, empyema from past pulmonary infarct  Plan/Discussion:     BIPAP until hypercarbia improved Cont with VAD teaching Increase mobility- as BIPAP allows Coumadin per pharmD goal 2.5-3 DC EPWs just before DC Add Ensure to supp diet OOB with BIPAP daily    I reviewed the LVAD parameters from today, and compared the results to the patient's prior recorded data.  No programming changes were made.  The LVAD is functioning within specified parameters.  The patient performs LVAD self-test daily.  LVAD interrogation was negative for any significant power changes, alarms or PI events/speed drops.  LVAD equipment check completed and is in good working order.  Back-up equipment present.   LVAD education done on emergency procedures and precautions and reviewed exit site care.  Length of Stay: [place] III 10/01/2014, 8:46 AM

## 2014-10-02 DIAGNOSIS — J9600 Acute respiratory failure, unspecified whether with hypoxia or hypercapnia: Secondary | ICD-10-CM

## 2014-10-02 LAB — CBC
HCT: 28.1 % — ABNORMAL LOW (ref 39.0–52.0)
HEMOGLOBIN: 7.9 g/dL — AB (ref 13.0–17.0)
MCH: 27.1 pg (ref 26.0–34.0)
MCHC: 28.1 g/dL — AB (ref 30.0–36.0)
MCV: 96.2 fL (ref 78.0–100.0)
Platelets: 821 10*3/uL — ABNORMAL HIGH (ref 150–400)
RBC: 2.92 MIL/uL — ABNORMAL LOW (ref 4.22–5.81)
RDW: 17.6 % — ABNORMAL HIGH (ref 11.5–15.5)
WBC: 13.7 10*3/uL — AB (ref 4.0–10.5)

## 2014-10-02 LAB — GLUCOSE, CAPILLARY
GLUCOSE-CAPILLARY: 77 mg/dL (ref 70–99)
GLUCOSE-CAPILLARY: 102 mg/dL — AB (ref 70–99)
Glucose-Capillary: 149 mg/dL — ABNORMAL HIGH (ref 70–99)
Glucose-Capillary: 134 mg/dL — ABNORMAL HIGH (ref 70–99)

## 2014-10-02 LAB — PROTIME-INR
INR: 2.54 — ABNORMAL HIGH (ref 0.00–1.49)
Prothrombin Time: 27.6 seconds — ABNORMAL HIGH (ref 11.6–15.2)

## 2014-10-02 LAB — LACTATE DEHYDROGENASE: LDH: 311 U/L — ABNORMAL HIGH (ref 94–250)

## 2014-10-02 MED ORDER — LEVALBUTEROL HCL 1.25 MG/0.5ML IN NEBU
1.2500 mg | INHALATION_SOLUTION | Freq: Four times a day (QID) | RESPIRATORY_TRACT | Status: DC
  Administered 2014-10-02 – 2014-10-04 (×7): 1.25 mg via RESPIRATORY_TRACT
  Filled 2014-10-02 (×13): qty 0.5

## 2014-10-02 MED ORDER — FUROSEMIDE 20 MG PO TABS: 20.0000 mg | ORAL_TABLET | Freq: Every day | ORAL | Status: DC

## 2014-10-02 MED ORDER — FUROSEMIDE 20 MG PO TABS
20.0000 mg | ORAL_TABLET | Freq: Two times a day (BID) | ORAL | Status: DC
  Administered 2014-10-02 – 2014-10-04 (×6): 20 mg via ORAL
  Filled 2014-10-02 (×7): qty 1

## 2014-10-02 MED ORDER — WARFARIN SODIUM 4 MG PO TABS
4.0000 mg | ORAL_TABLET | Freq: Once | ORAL | Status: AC
  Administered 2014-10-02: 4 mg via ORAL
  Filled 2014-10-02: qty 1

## 2014-10-02 MED ORDER — BUDESONIDE-FORMOTEROL FUMARATE 160-4.5 MCG/ACT IN AERO
2.0000 | INHALATION_SPRAY | Freq: Two times a day (BID) | RESPIRATORY_TRACT | Status: DC
  Administered 2014-10-02 – 2014-10-04 (×4): 2 via RESPIRATORY_TRACT
  Filled 2014-10-02: qty 6

## 2014-10-02 NOTE — Progress Notes (Signed)
HeartMate 2 Rounding Note  Subjective:    Post Day 11 - HMII LVAD   Moved to ICU 3/31 for recurrent CO2 retention. Improved with Bipap but unable to wean Bipap yesterday. This am doing great. Off bipap. Wide awake.   On Zosyn. WBC falling. Weight stable off lasix. Maintaining NSR on amio.   LVAD INTERROGATION:  HeartMate II LVAD:  Flow 5.9 liters/min, speed 9400 power 6.0, PI 5.4   No PI events.  Objective:    Vital Signs:   Temp:  [96.3 F (35.7 C)-98.8 F (37.1 C)] 97.5 F (36.4 C) (04/02 0825) Pulse Rate:  [54-79] 79 (04/02 0825) Resp:  [19-22] 19 (04/02 0831) SpO2:  [97 %-100 %] 99 % (04/02 0825) Weight:  [85 kg (187 lb 6.3 oz)] 85 kg (187 lb 6.3 oz) (04/02 0347) Last BM Date: 10/01/14 Mean arterial Pressure  78-84  Intake/Output:   Intake/Output Summary (Last 24 hours) at 10/02/14 0908 Last data filed at 10/02/14 0600  Gross per 24 hour  Intake    773 ml  Output    965 ml  Net   -192 ml     Physical Exam: General:  sitting in bed. On bipap lethargic but awakens to voice HEENT: normal Neck: supple. Carotids 2+ bilat; no bruits. No lymphadenopathy or thryomegaly appreciated.  Cor: Mechanical heart sounds with LVAD hum present.  Lungs: clear Abdomen: soft, nontender, nondistended. No hepatosplenomegaly. No bruits or masses. Good bowel sounds. Driveline: Dressing looks good.  Extremities: no cyanosis, clubbing, rash, 1+ edema R hand weak. R index finger slightly mottled. Sensation intact Neuro: Lethargic but awakens and follows commands.  Telemetry: NSR 80-90s   Labs: Basic Metabolic Panel:  Recent Labs Lab 09/26/14 0412  09/26/14 1713 09/27/14 0430 09/28/14 0530 09/30/14 1150 10/01/14 0401  NA 131*  < > 136 138 137 140 140  K 4.2  < > 3.7 3.6 4.1 4.5 4.3  CL 90*  < > 89* 90* 93* 92* 94*  CO2 38*  --   --  39* 39* 38* 39*  GLUCOSE 487*  < > 128* 105* 98 131* 117*  BUN 35*  < > 32* 27* 19 12 10   CREATININE 1.11  < > 1.10 0.97 0.81 0.76 0.81  CALCIUM  8.1*  --   --  8.2* 8.4 8.7 8.6  MG 2.3  --   --  1.9 2.1 2.1  --   PHOS 4.5  --   --   --   --   --   --   < > = values in this interval not displayed.  Liver Function Tests:  Recent Labs Lab 09/26/14 0412 09/27/14 0430 09/28/14 0530 10/01/14 0401  AST 97* 81* 64* 51*  ALT 120* 96* 77* 59*  ALKPHOS 155* 172* 182* 266*  BILITOT 1.3* 1.5* 1.0 1.0  PROT 6.0 5.7* 5.8* 6.9  ALBUMIN 2.4* 2.2* 2.1* 2.4*   No results for input(s): LIPASE, AMYLASE in the last 168 hours.  Recent Labs Lab 09/30/14 1445  AMMONIA 46*    CBC:  Recent Labs Lab 09/26/14 0412  09/27/14 0430 09/28/14 0530 09/29/14 0530 09/30/14 0510 10/01/14 0401 10/02/14 0516  WBC 11.4*  --  15.4* 17.2* 15.6* 14.5* 15.3* 13.7*  NEUTROABS 9.4*  --  12.5*  --   --   --   --   --   HGB 8.2*  < > 9.3* 9.2* 8.7* 8.6* 8.9* 7.9*  HCT 28.1*  < > 31.2* 30.8* 29.6* 29.6* 31.9* 28.1*  MCV 94.6  --  90.2 91.9 94.0 94.6 96.7 96.2  PLT 280  --  405* 511* 576* 659* 812* 821*  < > = values in this interval not displayed.  INR:  Recent Labs Lab 09/28/14 0530 09/29/14 0530 09/30/14 0510 10/01/14 0401 10/02/14 0516  INR 2.25* 2.47* 2.64* 2.37* 2.54*    Other results:    Imaging: Dg Chest 2 View  10/01/2014   CLINICAL DATA:  Status post placement of left ventricular assist device  EXAM: CHEST  2 VIEW  COMPARISON:  PA and lateral chest of September 28, 2014  FINDINGS: The lungs are less well inflated today. There has been interval development of left lower lobe atelectasis. A small left pleural effusion persists. On the right there is persistent infiltrate in the right middle lobe. The cardiopericardial silhouette remains enlarged. The central pulmonary vascularity is less distinct and there is mild pulmonary edema. There are 7 intact sternal wires. The left ventricular assist device is unchanged in position. The right-sided PICC line tip projects over the distal portion of the SVC.  IMPRESSION: Interval deterioration in the  appearance of the chest with interval development of left lower lobe atelectasis, mild progression in right middle lobe atelectasis/pneumonia, and development of mild pulmonary interstitial edema.   Electronically Signed   By: David  Martinique   On: 10/01/2014 07:58     Medications:     Scheduled Medications: . amiodarone  200 mg Oral BID  . aspirin EC  325 mg Oral Daily  . bisacodyl  10 mg Oral Daily  . dextromethorphan-guaiFENesin  1 tablet Oral BID  . docusate sodium  200 mg Oral Daily  . feeding supplement (ENSURE ENLIVE)  237 mL Oral TID BM  . ferrous [id number] C-folic acid  1 capsule Oral TID PC  . gabapentin  300 mg Oral BID  . insulin aspart  0-24 Units Subcutaneous TID AC & HS  . pantoprazole  40 mg Oral Daily  . piperacillin-tazobactam (ZOSYN)  IV  3.375 g Intravenous 3 times per day  . polyethylene glycol  17 g Oral Daily  . rosuvastatin  10 mg Oral q1800  . sodium chloride  10-40 mL Intracatheter Q12H  . sodium chloride  3 mL Intravenous Q12H  . traZODone  50 mg Oral QHS  . Warfarin - Pharmacist Dosing Inpatient   Does not apply q1800    Infusions: . sodium chloride    . sodium chloride 20 mL/hr at 09/23/14 1900    PRN Medications: sodium chloride, magnesium hydroxide, ondansetron (ZOFRAN) IV, sodium chloride, sodium chloride, traMADol   Assessment:    1.  A/C systolic HF class IV with cardiogenic shock- EF 15% with moderate RV dysfunction 3/16 echo.         -S/P HMII LVAD 09/21/14  2.  CAD s/p Anterior STEMI on 06/12/14 with stenting of LAD- on coumadin + plavix, statin.  3. Acute on chronic hypercarbic respiratory failure  4.  06/12/14 VT in setting of STEMI 5.  Ankylosing spondylitis. 6. Bilateral PE with R DVT- S/P IVC filter 08/27/2014--->coumadin 7. Pleural effusion, transudative  8. ?PNA 9. Acute Blood Loss- Anemia- Received 1UPRBCs 3/23  Plan/Discussion:    POD #11 HMII LVAD.   Doing well except for recurrent hypercarbic respiratory  issues. Improves with Bipap but has been hard to wean. Much improved this am. Will have him use bipap every night and then try to keep him off during the day. Encourage ambulation and frequent incentive spirometry. Will also recheck  ammonia and start lactulose as needed though I don't think this is playing a major role. Appreciate PCCM input.   Can got to SDU.   Continue Zosyn for 7 day course.   Weight currently stable off diuretics. Co-ox and ammonia ok.  Maintaining NSR on po amio.   On coumadin INR 2.5    Will not restart Plavix as anterior wall completely necrotic.    Ramp echo likely Monday. Continue 9400 for now  I reviewed the LVAD parameters from today, and compared the results to the patient's prior recorded data.  No programming changes were made.  The LVAD is functioning within specified parameters.  The patient performs LVAD self-test daily.  LVAD interrogation was negative for any significant power changes, alarms or PI events/speed drops.  LVAD equipment check completed and is in good working order.  Back-up equipment present.   LVAD education done on emergency procedures and precautions and reviewed exit site care.  Length of Stay: 22  [name]  MD  10/02/2014, 9:08 AM  VAD Team --- VAD ISSUES ONLY--- Pager [phone] (7am - 7am)  Advanced Heart Failure Team  Pager [phone] (M-F; 7a - 4p)  Please contact [facility] for night-coverage after hours (4p -7a ) and weekends on [email or web address]

## 2014-10-02 NOTE — Progress Notes (Signed)
ANTICOAGULATION CONSULT NOTE - Follow Up Consult  Pharmacy Consult for Coumadin Indication: LVAD  No Known Allergies  Patient Measurements: Height: 5\' 8"  (172.7 cm) Weight: 187 lb 6.3 oz (85 kg) IBW/kg (Calculated) : 68.4  Vital Signs: Temp: 97.5 F (36.4 C) (04/02 0825) Temp Source: Oral (04/02 0825) Pulse Rate: 79 (04/02 0825)  Labs:  Recent Labs  09/30/14 0510 09/30/14 1150 10/01/14 0401 10/02/14 0516  HGB 8.6*  --  8.9* 7.9*  HCT 29.6*  --  31.9* 28.1*  PLT 659*  --  812* 821*  LABPROT 28.4*  --  26.1* 27.6*  INR 2.64*  --  2.37* 2.54*  CREATININE  --  0.76 0.81  --     Estimated Creatinine Clearance: 95.2 mL/min (by C-G formula based on Cr of 0.81).  Assessment: 69yo male on warf PTA recent bilat PE and RLE DVT, s/p IVC filter (08/2014), admitted 3/11 with low output HF, LVAD placed 3/22, Pharmacy consulted to dose warfarin. INR is therapeutic at 2.54. CBC low but stable. No s/s of bleed.  Coumadin PTA: 4mg  daily exc 2mg  Mon/Fri  Goal of Therapy:  INR 2-3 Monitor platelets by anticoagulation protocol: Yes   Plan:  Give coumadin 4mg  x 1 Monitor daily INR, CBC, s/s of bleed  Shyne Resch J 10/02/2014,9:53 AM

## 2014-10-02 NOTE — Progress Notes (Signed)
Patient taken off of BIpap and placed on 4L nasal cannula.  Currently tolerating well.  Sats currently 100%.  Will continue to monitor.

## 2014-10-02 NOTE — Progress Notes (Signed)
HeartMate 2 Rounding Note  Subjective:   POD #11   VAD implant  (DT)  For acute /chronic systolic HF from ischemic cardiomyopathy and cardiogenic shock on preop IABP  Recent PE- with large R pulmonary infarct,necrotic    lung/pneumonia- poss empyema on IV Zosyn- brown sputum Restrictive lung disease- FEV1 1.3 from ankylosing spondylitis DM  HX ventricular, atrial arrhythmia Postoperative CO2 retention requiring BiPAP-transferred to ICU for hypercarbia-appears much improved today  plan to use BIPAP EVERY NIGHT and off during day for meals and PT-ambulation     VAD flows and PI improved after lasix stopped-no PI events in last 24 hrs Ileus resolved-  TNA stopped, reglan stopped Maintaining NSR on po amiodarone Pocket drainout    Bilateral rales with CXR yesterday wet- will resume gentle diuresis 20 po bid  Lower sternal incision continues  with no drainage - cont betadine swab and dry dressing Pump parameters are satisfactory no PI events MAP now 85-    INR slowly rising - now 2.4 - coumadin per PharmD   HeartMate II LVAD:  Flow 5.5 liters/min, speed 9400 rpm power 5.1, PI  5.0  Controller intact  Objective:    Vital Signs:   Temp:  [96.3 F (35.7 C)-98.8 F (37.1 C)] 97.5 F (36.4 C) (04/02 0825) Pulse Rate:  [54-79] 79 (04/02 0825) Resp:  [19-22] 19 (04/02 0831) SpO2:  [97 %-100 %] 99 % (04/02 0825) Weight:  [187 lb 6.3 oz (85 kg)] 187 lb 6.3 oz (85 kg) (04/02 0347) Last BM Date: 10/01/14 Mean arterial Pressure 75  Intake/Output:   Intake/Output Summary (Last 24 hours) at 10/02/14 1134 Last data filed at 10/02/14 0800  Gross per 24 hour  Intake    523 ml  Output   1000 ml  Net   -477 ml     Physical Exam: General:alert off BIPAP HEENT: normal Neck: supple. JVP . Carotids 2+ bilat; no bruits. No lymphadenopathy or thryomegaly appreciated. Cor: Mechanical heart sounds with LVAD hum present. Lungs: bibasilar rales Abdomen: soft, nontender, nondistended. No  hepatosplenomegaly. No bruits or masses. Good bowel sounds. Driveline: C/D/I; securement device intact and driveline incorporated Extremities: warm well perfused,no cyanosis, clubbing, rash, edema- IABP out without hematoma and groin- c/o pain in R middle finger - prob related to a-line Neuro: moves all extremities Sternal incision dry   Telemetry:nsr  Labs: Basic Metabolic Panel:  Recent Labs Lab 09/26/14 0412  09/26/14 1713 09/27/14 0430 09/28/14 0530 09/30/14 1150 10/01/14 0401  NA 131*  < > 136 138 137 140 140  K 4.2  < > 3.7 3.6 4.1 4.5 4.3  CL 90*  < > 89* 90* 93* 92* 94*  CO2 38*  --   --  39* 39* 38* 39*  GLUCOSE 487*  < > 128* 105* 98 131* 117*  BUN 35*  < > 32* 27* 19 12 10   CREATININE 1.11  < > 1.10 0.97 0.81 0.76 0.81  CALCIUM 8.1*  --   --  8.2* 8.4 8.7 8.6  MG 2.3  --   --  1.9 2.1 2.1  --   PHOS 4.5  --   --   --   --   --   --   < > = values in this interval not displayed.  Liver Function Tests:  Recent Labs Lab 09/26/14 0412 09/27/14 0430 09/28/14 0530 10/01/14 0401  AST 97* 81* 64* 51*  ALT 120* 96* 77* 59*  ALKPHOS 155* 172* 182* 266*  BILITOT 1.3*  1.5* 1.0 1.0  PROT 6.0 5.7* 5.8* 6.9  ALBUMIN 2.4* 2.2* 2.1* 2.4*   No results for input(s): LIPASE, AMYLASE in the last 168 hours.  Recent Labs Lab 09/30/14 1445  AMMONIA 46*    CBC:  Recent Labs Lab 09/26/14 0412  09/27/14 0430 09/28/14 0530 09/29/14 0530 09/30/14 0510 10/01/14 0401 10/02/14 0516  WBC 11.4*  --  15.4* 17.2* 15.6* 14.5* 15.3* 13.7*  NEUTROABS 9.4*  --  12.5*  --   --   --   --   --   HGB 8.2*  < > 9.3* 9.2* 8.7* 8.6* 8.9* 7.9*  HCT 28.1*  < > 31.2* 30.8* 29.6* 29.6* 31.9* 28.1*  MCV 94.6  --  90.2 91.9 94.0 94.6 96.7 96.2  PLT 280  --  405* 511* 576* 659* 812* 821*  < > = values in this interval not displayed.  INR:  Recent Labs Lab 09/28/14 0530 09/29/14 0530 09/30/14 0510 10/01/14 0401 10/02/14 0516  INR 2.25* 2.47* 2.64* 2.37* 2.54*    Other  results:  EKG:   Imaging: Dg Chest 2 View  10/01/2014   CLINICAL DATA:  Status post placement of left ventricular assist device  EXAM: CHEST  2 VIEW  COMPARISON:  PA and lateral chest of September 28, 2014  FINDINGS: The lungs are less well inflated today. There has been interval development of left lower lobe atelectasis. A small left pleural effusion persists. On the right there is persistent infiltrate in the right middle lobe. The cardiopericardial silhouette remains enlarged. The central pulmonary vascularity is less distinct and there is mild pulmonary edema. There are 7 intact sternal wires. The left ventricular assist device is unchanged in position. The right-sided PICC line tip projects over the distal portion of the SVC.  IMPRESSION: Interval deterioration in the appearance of the chest with interval development of left lower lobe atelectasis, mild progression in right middle lobe atelectasis/pneumonia, and development of mild pulmonary interstitial edema.   Electronically Signed   By: David  [place]   On: 10/01/2014 07:58     Medications:     Scheduled Medications: . amiodarone  200 mg Oral BID  . aspirin EC  325 mg Oral Daily  . bisacodyl  10 mg Oral Daily  . budesonide-formoterol  2 puff Inhalation BID  . dextromethorphan-guaiFENesin  1 tablet Oral BID  . docusate sodium  200 mg Oral Daily  . ferrous [id number] C-folic acid  1 capsule Oral TID PC  . furosemide  20 mg Oral BID  . gabapentin  300 mg Oral BID  . insulin aspart  0-24 Units Subcutaneous TID AC & HS  . levalbuterol  1.25 mg Nebulization 4 times per day  . pantoprazole  40 mg Oral Daily  . piperacillin-tazobactam (ZOSYN)  IV  3.375 g Intravenous 3 times per day  . polyethylene glycol  17 g Oral Daily  . rosuvastatin  10 mg Oral q1800  . sodium chloride  10-40 mL Intracatheter Q12H  . sodium chloride  3 mL Intravenous Q12H  . traZODone  50 mg Oral QHS  . warfarin  4 mg Oral ONCE-1800  . Warfarin -  Pharmacist Dosing Inpatient   Does not apply q1800    Infusions: . sodium chloride    . sodium chloride 20 mL/hr at 09/23/14 1900    PRN Medications: sodium chloride, magnesium hydroxide, ondansetron (ZOFRAN) IV, sodium chloride, sodium chloride, traMADol   Assessment:  Stable hemodynamics- , VAD speed 9400 C02 improved off BiPAP- persistent  02 requirement  Cont IV Zosyn for possible pneumonia, empyema from past pulmonary infarct  Plan/Discussion:     BIPAP each night Cont with VAD teaching Increase mobility- walked 350 ft yesterday Coumadin per pharmD goal 2.5-3 DC EPWs just before DC Add Ensure to supp diet Liberalize to regular diet to improve PO intake Echo speed study next week   I reviewed the LVAD parameters from today, and compared the results to the patient's prior recorded data.  No programming changes were made.  The LVAD is functioning within specified parameters.  The patient performs LVAD self-test daily.  LVAD interrogation was negative for any significant power changes, alarms or PI events/speed drops.  LVAD equipment check completed and is in good working order.  Back-up equipment present.   LVAD education done on emergency procedures and precautions and reviewed exit site care.  Length of Stay: [place] III 10/02/2014, 11:34 AM

## 2014-10-02 NOTE — Progress Notes (Signed)
CARDIAC REHAB PHASE I   PRE:  Rate/Rhythm: 76 SR    BP: sitting 76     SaO2: 100 4L  MODE:  Ambulation: 660 ft   POST:  Rate/Rhythm: 82 SR    BP: sitting 80     SaO2: 94 2L  Pt moving well. No c/o. Wanted to go on 2L. SaO2 hard/slow to register but seemingly ok. Sat and rested x1 for long distance. Pt changed back to power cord by himself after walking. Encouraged more walking this weekend, will f/u Monday. Left on 2L and notified RN. [place], [place], ACSM 10/02/2014 2:56 PM

## 2014-10-02 NOTE — Progress Notes (Signed)
PULMONARY / CRITICAL CARE MEDICINE   Name: Scott [name] MRN: [id number] DOB: [birth year]    ADMISSION DATE:  09/10/2014 CONSULTATION DATE:  Initial - 3/18.Marland Kitchen Re-consulted 3/31  REFERRING MD :  Bensimhon   CHIEF COMPLAINT:  Respiratory failure   INITIAL PRESENTATION:  69 y.o. M with extensive cardiac hx as well as recent PE / DVT, admitted for SOB due to heart failure, cardiogenic, shock, PE. Thought to be good candidate for VAD. Found to have mod - large R pleural effusion, PCCM initially consulted 3/18 for thoracentesis as part of optimization prior to VAD. Pt ultimately had VAD placed 3/23.  Was progressing well post op until 3/31 when he developed progressive AMS.  Tx to ICU and PCCM consulted.   STUDIES:  LE dopplers from OSH: Right leg DVT  CT Chest from OSH: bilateral diffuse PEs w/o overt signs of right heart strain ECHO 12/12 >>EF 20%; mid Lventricle and apex is akinetic ECHO 12/21 >> Echo showed EF <20%, severely decreased global systolic LV function, dilated CM, moderate MR/TR  SIGNIFICANT EVENTS: 3/11 admit  3/18 thoracentesis - exudative effusion, likely parapneumonic  3/23 VAD placement  3/31 acute hypercarbic respiratory failure   SUBJECTIVE:  Awake and currently off nimvs   VITAL SIGNS: Temp:  [96.3 F (35.7 C)-98.8 F (37.1 C)] 96.8 F (36 C) (04/02 0347) Pulse Rate:  [54-79] 78 (04/02 0600) Resp:  [13-22] 20 (04/02 0404) SpO2:  [88 %-100 %] 99 % (04/02 0600) FiO2 (%):  [30 %] 30 % (04/01 0841) Weight:  [187 lb 6.3 oz (85 kg)] 187 lb 6.3 oz (85 kg) (04/02 0347) HEMODYNAMICS:   VENTILATOR SETTINGS: Vent Mode:  [-]  FiO2 (%):  [30 %] 30 % INTAKE / OUTPUT:  Intake/Output Summary (Last 24 hours) at 10/02/14 0815 Last data filed at 10/02/14 0600  Gross per 24 hour  Intake    773 ml  Output    965 ml  Net   -192 ml    PHYSICAL EXAMINATION: General:  acutely ill appearing male, asleep on NIMVS Neuro:  Awake and interactive when awake HEENT:  Mm  moist, off bipap Cardiovascular:  Mechanical heart sounds with VAD Lungs:  resps even, non labored, slightly tachypneic on bipap, few scattered rhonchi  Abdomen:  Soft, non tender, dressings c/d  Musculoskeletal:  Pale, cool, dry, scant BLE edema   LABS:  CBC  Recent Labs Lab 09/30/14 0510 10/01/14 0401 10/02/14 0516  WBC 14.5* 15.3* 13.7*  HGB 8.6* 8.9* 7.9*  HCT 29.6* 31.9* 28.1*  PLT 659* 812* 821*   Coag's  Recent Labs Lab 09/30/14 0510 10/01/14 0401 10/02/14 0516  INR 2.64* 2.37* 2.54*   BMET  Recent Labs Lab 09/28/14 0530 09/30/14 1150 10/01/14 0401  NA 137 140 140  K 4.1 4.5 4.3  CL 93* 92* 94*  CO2 39* 38* 39*  BUN 19 12 10   CREATININE 0.81 0.76 0.81  GLUCOSE 98 131* 117*   Electrolytes  Recent Labs Lab 09/26/14 0412 09/27/14 0430 09/28/14 0530 09/30/14 1150 10/01/14 0401  CALCIUM 8.1* 8.2* 8.4 8.7 8.6  MG 2.3 1.9 2.1 2.1  --   PHOS 4.5  --   --   --   --    Sepsis Markers No results for input(s): LATICACIDVEN, PROCALCITON, O2SATVEN in the last 168 hours. ABG  Recent Labs Lab 09/30/14 1436 09/30/14 1548 10/01/14 1056  PHART 7.218* 7.324* 7.366  PCO2ART 100.0* 79.3* 79.8*  PO2ART 193.0* 85.6 142.0*   Liver Enzymes  Recent  Labs Lab 09/27/14 0430 09/28/14 0530 10/01/14 0401  AST 81* 64* 51*  ALT 96* 77* 59*  ALKPHOS 172* 182* 266*  BILITOT 1.5* 1.0 1.0  ALBUMIN 2.2* 2.1* 2.4*   Cardiac Enzymes No results for input(s): TROPONINI, PROBNP in the last 168 hours. Glucose  Recent Labs Lab 09/30/14 1114 09/30/14 1720 09/30/14 2215 10/01/14 1142 10/01/14 1541 10/01/14 2139  GLUCAP 117* 115* 107* 121* 141* 114*    Imaging Dg Chest 2 View  10/01/2014   CLINICAL DATA:  Status post placement of left ventricular assist device  EXAM: CHEST  2 VIEW  COMPARISON:  PA and lateral chest of September 28, 2014  FINDINGS: The lungs are less well inflated today. There has been interval development of left lower lobe atelectasis. A small  left pleural effusion persists. On the right there is persistent infiltrate in the right middle lobe. The cardiopericardial silhouette remains enlarged. The central pulmonary vascularity is less distinct and there is mild pulmonary edema. There are 7 intact sternal wires. The left ventricular assist device is unchanged in position. The right-sided PICC line tip projects over the distal portion of the SVC.  IMPRESSION: Interval deterioration in the appearance of the chest with interval development of left lower lobe atelectasis, mild progression in right middle lobe atelectasis/pneumonia, and development of mild pulmonary interstitial edema.   Electronically Signed   By: David  Martinique   On: 10/01/2014 07:58     ASSESSMENT / PLAN:  PULMONARY Acute hypercarbic respiratory failure, improved after BiPAP Known PE  Pleural effusions  P:   Bipap prn e pap 8 working well F/u ABG  F/u CXR  Avoid oversedation  Supplemental O2 as needed   CARDIOVASCULAR CVL RUE PICC  Acute on chronic sCHF - class IV with cardiogenic shock. EF 15%, now s/p LVAD  S/p LVAD placement 3/23 CAD Bilat PE/DVT  LV thrombus  P:  Per Cards  Now off milrinone, lasix  Cont amiodarone  Coumadin   RENAL Lab Results  Component Value Date   CREATININE 0.81 10/01/2014   CREATININE 0.76 09/30/2014   CREATININE 0.81 09/28/2014    Acute on chronic renal failure - resolved P:   F/u chem   GASTROINTESTINAL Hyperammonemia  P:   F/u LFT's  Consider lactulose   HEMATOLOGIC Leukocytosis  Anemia - mild  P:  Cont coumadin per pharmacy  Abx as below   INFECTIOUS UTI  P:   Urine 3/28>>> neg  Zosyn 3/29>>>  ENDOCRINE Hyperglycemia  P:   SSI   NEUROLOGIC AMS - in setting significant hypercarbia.  Improving with bipap.  P:   Cont bipap as needed Supportive care  Consider lactulose in setting hyperammonemia if continued AMS   Consider tx to SDU 4/2   Richardson Landry Raylee Strehl ACNP Maryanna Shape PCCM Pager [phone] till  3 pm If no answer page [phone] 10/02/2014, 8:15 AM

## 2014-10-03 ENCOUNTER — Inpatient Hospital Stay (HOSPITAL_COMMUNITY): Payer: Medicare Other

## 2014-10-03 LAB — GLUCOSE, CAPILLARY
Glucose-Capillary: 127 mg/dL — ABNORMAL HIGH (ref 70–99)
Glucose-Capillary: 102 mg/dL — ABNORMAL HIGH (ref 70–99)
Glucose-Capillary: 138 mg/dL — ABNORMAL HIGH (ref 70–99)
Glucose-Capillary: 110 mg/dL — ABNORMAL HIGH (ref 70–99)

## 2014-10-03 LAB — COMPREHENSIVE METABOLIC PANEL
ALT: 53 U/L (ref 0–53)
AST: 50 U/L — ABNORMAL HIGH (ref 0–37)
Albumin: 2.1 g/dL — ABNORMAL LOW (ref 3.5–5.2)
Alkaline Phosphatase: 216 U/L — ABNORMAL HIGH (ref 39–117)
Anion gap: 9 (ref 5–15)
BUN: 8 mg/dL (ref 6–23)
CO2: 37 mmol/L — ABNORMAL HIGH (ref 19–32)
Calcium: 8.1 mg/dL — ABNORMAL LOW (ref 8.4–10.5)
Chloride: 92 mmol/L — ABNORMAL LOW (ref 96–112)
Creatinine, Ser: 0.69 mg/dL (ref 0.50–1.35)
GFR calc Af Amer: >90 mL/min (ref >90)
GFR calc non Af Amer: >90 mL/min (ref >90)
Glucose, Bld: 108 mg/dL — ABNORMAL HIGH (ref 70–99)
Potassium: 3.7 mmol/L (ref 3.5–5.1)
Sodium: 138 mmol/L (ref 135–145)
Total Bilirubin: 0.9 mg/dL (ref 0.3–1.2)
Total Protein: 5.9 g/dL — ABNORMAL LOW (ref 6.0–8.3)

## 2014-10-03 LAB — CBC
HCT: 27.1 % — ABNORMAL LOW (ref 39.0–52.0)
Hemoglobin: 8 g/dL — ABNORMAL LOW (ref 13.0–17.0)
MCH: 27.5 pg (ref 26.0–34.0)
MCHC: 29.5 g/dL — ABNORMAL LOW (ref 30.0–36.0)
MCV: 93.1 fL (ref 78.0–100.0)
Platelets: 802 10*3/uL — ABNORMAL HIGH (ref 150–400)
RBC: 2.91 MIL/uL — ABNORMAL LOW (ref 4.22–5.81)
RDW: 17.7 % — ABNORMAL HIGH (ref 11.5–15.5)
WBC: 15.3 10*3/uL — ABNORMAL HIGH (ref 4.0–10.5)

## 2014-10-03 LAB — PROTIME-INR
INR: 2.69 — ABNORMAL HIGH (ref 0.00–1.49)
Prothrombin Time: 28.8 seconds — ABNORMAL HIGH (ref 11.6–15.2)

## 2014-10-03 LAB — LACTATE DEHYDROGENASE: LDH: 322 U/L — ABNORMAL HIGH (ref 94–250)

## 2014-10-03 MED ORDER — WARFARIN SODIUM 4 MG PO TABS
4.0000 mg | ORAL_TABLET | Freq: Once | ORAL | Status: AC
Start: 2014-10-03 — End: 2014-10-03
  Administered 2014-10-03: 4 mg via ORAL
  Filled 2014-10-03: qty 1

## 2014-10-03 NOTE — Discharge Instructions (Signed)
Information on my medicine - Coumadin   (Warfarin)  This medication education was reviewed with me or my healthcare representative as part of my discharge preparation.  The pharmacist that spoke with me during my hospital stay was:  Reginia Naas, [facility]  Why was Coumadin prescribed for you? Coumadin was prescribed for you because you have a blood clot or a medical condition that can cause an increased risk of forming blood clots. Blood clots can cause serious health problems by blocking the flow of blood to the heart, lung, or brain. Coumadin can prevent harmful blood clots from forming. As a reminder your indication for Coumadin is:   Blood Clot Prevention After Heart Pump Surgery  What test will check on my response to Coumadin? While on Coumadin (warfarin) you will need to have an INR test regularly to ensure that your dose is keeping you in the desired range. The INR (international normalized ratio) number is calculated from the result of the laboratory test called prothrombin time (PT).  If an INR APPOINTMENT HAS NOT ALREADY BEEN MADE FOR YOU please schedule an appointment to have this lab work done by your health care provider within 7 days. Your INR goal is usually a number between:  2 to 3 or your provider may give you a more narrow range like 2-2.5.  Ask your health care provider during an office visit what your goal INR is.  What  do you need to  know  About  COUMADIN? Take Coumadin (warfarin) exactly as prescribed by your healthcare provider about the same time each day.  DO NOT stop taking without talking to the doctor who prescribed the medication.  Stopping without other blood clot prevention medication to take the place of Coumadin may increase your risk of developing a new clot or stroke.  Get refills before you run out.  What do you do if you miss a dose? If you miss a dose, take it as soon as you remember on the same day then continue your regularly scheduled regimen the next  day.  Do not take two doses of Coumadin at the same time.  Important Safety Information A possible side effect of Coumadin (Warfarin) is an increased risk of bleeding. You should call your healthcare provider right away if you experience any of the following: ? Bleeding from an injury or your nose that does not stop. ? Unusual colored urine (red or dark brown) or unusual colored stools (red or black). ? Unusual bruising for unknown reasons. ? A serious fall or if you hit your head (even if there is no bleeding).  Some foods or medicines interact with Coumadin (warfarin) and might alter your response to warfarin. To help avoid this: ? Eat a balanced diet, maintaining a consistent amount of Vitamin K. ? Notify your provider about major diet changes you plan to make. ? Avoid alcohol or limit your intake to 1 drink for women and 2 drinks for men per day. (1 drink is 5 oz. wine, 12 oz. beer, or 1.5 oz. liquor.)  Make sure that ANY health care provider who prescribes medication for you knows that you are taking Coumadin (warfarin).  Also make sure the healthcare provider who is monitoring your Coumadin knows when you have started a new medication including herbals and non-prescription products.  Coumadin (Warfarin)  Major Drug Interactions  Increased Warfarin Effect Decreased Warfarin Effect  Alcohol (large quantities) Antibiotics (esp. Septra/Bactrim, Flagyl, Cipro) Amiodarone (Cordarone) Aspirin (ASA) Cimetidine (Tagamet) Megestrol (Megace) NSAIDs (ibuprofen,  naproxen, etc.) °Piroxicam (Feldene) °Propafenone (Rythmol SR) °Propranolol (Inderal) °Isoniazid (INH) °Posaconazole (Noxafil) Barbiturates (Phenobarbital) °Carbamazepine (Tegretol) °Chlordiazepoxide (Librium) °Cholestyramine (Questran) °Griseofulvin °Oral Contraceptives °Rifampin °Sucralfate (Carafate) °Vitamin K  ° °Coumadin® (Warfarin) Major Herbal Interactions  °Increased Warfarin Effect Decreased Warfarin Effect   °Garlic °Ginseng °Ginkgo biloba Coenzyme Q10 °Green tea °St. John’s wort   ° °Coumadin® (Warfarin) FOOD Interactions  °Eat a consistent number of servings per week of foods HIGH in Vitamin K °(1 serving = ½ cup)  °Collards (cooked, or boiled & drained) °Kale (cooked, or boiled & drained) °Mustard greens (cooked, or boiled & drained) °Parsley *serving size only = ¼ cup °Spinach (cooked, or boiled & drained) °Swiss chard (cooked, or boiled & drained) °Turnip greens (cooked, or boiled & drained)  °Eat a consistent number of servings per week of foods MEDIUM-HIGH in Vitamin K °(1 serving = 1 cup)  °Asparagus (cooked, or boiled & drained) °Broccoli (cooked, boiled & drained, or raw & chopped) °Brussel sprouts (cooked, or boiled & drained) *serving size only = ½ cup °Lettuce, raw (green leaf, endive, romaine) °Spinach, raw °Turnip greens, raw & chopped  ° °These websites have more information on Coumadin (warfarin):  www.coumadin.com; °www.ahrq.gov/consumer/coumadin.htm; ° ° ° °

## 2014-10-03 NOTE — Progress Notes (Signed)
ANTICOAGULATION CONSULT NOTE - Follow Up Consult  Pharmacy Consult for Coumadin Indication: LVAD  No Known Allergies  Patient Measurements: Height: 5\' 8"  (172.7 cm) Weight: 181 lb 3.5 oz (82.2 kg) IBW/kg (Calculated) : 68.4  Vital Signs: Temp: 97.5 F (36.4 C) (04/03 0815) Temp Source: Oral (04/03 0815) Pulse Rate: 74 (04/03 0815)  Labs:  Recent Labs  09/30/14 1150  10/01/14 0401 10/02/14 0516 10/03/14 0509  HGB  --   < > 8.9* 7.9* 8.0*  HCT  --   --  31.9* 28.1* 27.1*  PLT  --   --  812* 821* 802*  LABPROT  --   --  26.1* 27.6* 28.8*  INR  --   --  2.37* 2.54* 2.69*  CREATININE 0.76  --  0.81  --  0.69  < > = values in this interval not displayed.  Estimated Creatinine Clearance: 94.9 mL/min (by C-G formula based on Cr of 0.69).  Assessment: 69yo male on warf PTA recent bilat PE and RLE DVT, s/p IVC filter (08/2014), admitted 3/11 with low output HF, LVAD placed 3/22, Pharmacy consulted to dose warfarin. INR is therapeutic at 2.69. Hgb low but stable, plts elevated at 802. No s/s of bleed.  Coumadin PTA: 4mg  daily exc 2mg  Mon/Fri  Goal of Therapy:  INR 2-3 Monitor platelets by anticoagulation protocol: Yes   Plan:  Give coumadin 4mg  PO x 1 Monitor daily INR, CBC, s/s of bleed  [name] J 10/03/2014,8:47 AM

## 2014-10-03 NOTE — Progress Notes (Signed)
Pt ambulated 1.5 laps around unit with walker and 02. Stopped to rest twice

## 2014-10-03 NOTE — Progress Notes (Signed)
Patient ID: Scott [name], male   DOB: Aug 23, 1945, 69 y.o.   MRN: [id number] HeartMate 2 Rounding Note  Subjective:    Post Day 12 - HMII LVAD   Moved to ICU 3/31 for recurrent CO2 retention. Improved with Bipap but unable to wean Bipap yesterday. This am doing great. Off bipap. Wide awake.   On Zosyn. Weight stable, getting Lasix 20 bid. Maintaining NSR on amio.   LVAD INTERROGATION:  HeartMate II LVAD:  Flow 6.5 liters/min, speed 9400 power 6.5, PI 4   No PI events.  Objective:    Vital Signs:   Temp:  [97.3 F (36.3 C)-98.4 F (36.9 C)] 97.5 F (36.4 C) (04/03 0815) Pulse Rate:  [74-78] 74 (04/03 0815) Resp:  [18-38] 38 (04/03 0401) SpO2:  [96 %-100 %] 100 % (04/03 0918) FiO2 (%):  [30 %] 30 % (04/03 0102) Weight:  [181 lb 3.5 oz (82.2 kg)] 181 lb 3.5 oz (82.2 kg) (04/03 0415) Last BM Date: 10/03/14 Mean arterial Pressure  78-84  Intake/Output:   Intake/Output Summary (Last 24 hours) at 10/03/14 0929 Last data filed at 10/03/14 0837  Gross per 24 hour  Intake    630 ml  Output    975 ml  Net   -345 ml     Physical Exam: General:  sitting in bed. On bipap lethargic but awakens to voice HEENT: normal Neck: supple. Carotids 2+ bilat; no bruits. No lymphadenopathy or thryomegaly appreciated.  Cor: Mechanical heart sounds with LVAD hum present.  Lungs: clear Abdomen: soft, nontender, nondistended. No hepatosplenomegaly. No bruits or masses. Good bowel sounds. Driveline: Dressing looks good.  Extremities: no cyanosis, clubbing, rash.  No edema.  Neuro: Lethargic but awakens and follows commands.  Telemetry: NSR 80-90s   Labs: Basic Metabolic Panel:  Recent Labs Lab 09/27/14 0430 09/28/14 0530 09/30/14 1150 10/01/14 0401 10/03/14 0509  NA 138 137 140 140 138  K 3.6 4.1 4.5 4.3 3.7  CL 90* 93* 92* 94* 92*  CO2 39* 39* 38* 39* 37*  GLUCOSE 105* 98 131* 117* 108*  BUN 27* 19 12 10 8   CREATININE 0.97 0.81 0.76 0.81 0.69  CALCIUM 8.2* 8.4 8.7 8.6 8.1*   MG 1.9 2.1 2.1  --   --     Liver Function Tests:  Recent Labs Lab 09/27/14 0430 09/28/14 0530 10/01/14 0401 10/03/14 0509  AST 81* 64* 51* 50*  ALT 96* 77* 59* 53  ALKPHOS 172* 182* 266* 216*  BILITOT 1.5* 1.0 1.0 0.9  PROT 5.7* 5.8* 6.9 5.9*  ALBUMIN 2.2* 2.1* 2.4* 2.1*   No results for input(s): LIPASE, AMYLASE in the last 168 hours.  Recent Labs Lab 09/30/14 1445  AMMONIA 46*    CBC:  Recent Labs Lab 09/27/14 0430  09/29/14 0530 09/30/14 0510 10/01/14 0401 10/02/14 0516 10/03/14 0509  WBC 15.4*  < > 15.6* 14.5* 15.3* 13.7* 15.3*  NEUTROABS 12.5*  --   --   --   --   --   --   HGB 9.3*  < > 8.7* 8.6* 8.9* 7.9* 8.0*  HCT 31.2*  < > 29.6* 29.6* 31.9* 28.1* 27.1*  MCV 90.2  < > 94.0 94.6 96.7 96.2 93.1  PLT 405*  < > 576* 659* 812* 821* 802*  < > = values in this interval not displayed.  INR:  Recent Labs Lab 09/29/14 0530 09/30/14 0510 10/01/14 0401 10/02/14 0516 10/03/14 0509  INR 2.47* 2.64* 2.37* 2.54* 2.69*    Other results:  Imaging: Dg Chest Port 1 View  10/03/2014   CLINICAL DATA:  Acute on chronic congestive heart failure. Left ventricular assist device. Pneumonia.  EXAM: PORTABLE CHEST - 1 VIEW  COMPARISON:  10/01/2014  FINDINGS: Left ventricular assist device and right arm PICC line remain in place. Cardiomegaly stable. There is persistent opacity in the left retrocardiac lung base which is unchanged. There is also focal consolidation seen in the lateral right lung base which is unchanged. No pneumothorax visualized.  IMPRESSION: No significant change in left retrocardiac opacity and focal consolidation in the lateral right lung base.   Electronically Signed   By: Earle Gell M.D.   On: 10/03/2014 09:20     Medications:     Scheduled Medications: . amiodarone  200 mg Oral BID  . aspirin EC  325 mg Oral Daily  . bisacodyl  10 mg Oral Daily  . budesonide-formoterol  2 puff Inhalation BID  . dextromethorphan-guaiFENesin  1 tablet  Oral BID  . docusate sodium  200 mg Oral Daily  . ferrous [id number] C-folic acid  1 capsule Oral TID PC  . furosemide  20 mg Oral BID  . gabapentin  300 mg Oral BID  . insulin aspart  0-24 Units Subcutaneous TID AC & HS  . levalbuterol  1.25 mg Nebulization 4 times per day  . pantoprazole  40 mg Oral Daily  . piperacillin-tazobactam (ZOSYN)  IV  3.375 g Intravenous 3 times per day  . polyethylene glycol  17 g Oral Daily  . rosuvastatin  10 mg Oral q1800  . sodium chloride  10-40 mL Intracatheter Q12H  . sodium chloride  3 mL Intravenous Q12H  . traZODone  50 mg Oral QHS  . warfarin  4 mg Oral ONCE-1800  . Warfarin - Pharmacist Dosing Inpatient   Does not apply q1800    Infusions: . sodium chloride    . sodium chloride 20 mL/hr at 09/23/14 1900    PRN Medications: sodium chloride, magnesium hydroxide, ondansetron (ZOFRAN) IV, sodium chloride, sodium chloride, traMADol   Assessment:    1.  A/C systolic HF class IV with cardiogenic shock- EF 15% with moderate RV dysfunction 3/16 echo.         -S/P HMII LVAD 09/21/14  2.  CAD s/p Anterior STEMI on 06/12/14 with stenting of LAD- on coumadin + plavix, statin.  3. Acute on chronic hypercarbic respiratory failure  4.  06/12/14 VT in setting of STEMI 5.  Ankylosing spondylitis. 6. Bilateral PE with R DVT- S/P IVC filter 08/27/2014--->coumadin 7. Pleural effusion, transudative  8. ?PNA 9. Acute Blood Loss- Anemia- Received 1UPRBCs 3/23  Plan/Discussion:    POD #12 HMII LVAD.   Doing well except for recurrent hypercarbic respiratory issues. Improves with Bipap but has been hard to wean. Doing better now, off bipap during the day at this point. Will have him use bipap every night and then try to keep him off during the day. Encourage ambulation and frequent incentive spirometry.  Can got to SDU.   Continue Zosyn for 7 day course.   Weight currently stable to decreased, getting Lasix 20 bid.  Maintaining NSR on po  amio.   On coumadin INR 2.69 and LDH stable.  Will not restart Plavix as anterior wall completely necrotic.    Ramp echo Monday. Continue 9400 rpm for now  I reviewed the LVAD parameters from today, and compared the results to the patient's prior recorded data.  No programming changes were made.  The LVAD is  functioning within specified parameters.  The patient performs LVAD self-test daily.  LVAD interrogation was negative for any significant power changes, alarms or PI events/speed drops.  LVAD equipment check completed and is in good working order.  Back-up equipment present.   LVAD education done on emergency procedures and precautions and reviewed exit site care.  Length of Stay: 59  Loralie Champagne  MD  10/03/2014, 9:29 AM  VAD Team --- VAD ISSUES ONLY--- Pager [phone] (7am - 7am)  Advanced Heart Failure Team  Pager [phone] (M-F; 7a - 4p)  Please contact [facility] for night-coverage after hours (4p -7a ) and weekends on [email or web address]

## 2014-10-04 DIAGNOSIS — I5023 Acute on chronic systolic (congestive) heart failure: Secondary | ICD-10-CM

## 2014-10-04 DIAGNOSIS — Z95811 Presence of heart assist device: Secondary | ICD-10-CM

## 2014-10-04 LAB — BASIC METABOLIC PANEL
Anion gap: 8 (ref 5–15)
BUN: 7 mg/dL (ref 6–23)
CO2: 38 mmol/L — ABNORMAL HIGH (ref 19–32)
Calcium: 8 mg/dL — ABNORMAL LOW (ref 8.4–10.5)
Chloride: 94 mmol/L — ABNORMAL LOW (ref 96–112)
Creatinine, Ser: 0.6 mg/dL (ref 0.50–1.35)
GFR calc Af Amer: >90 mL/min (ref >90)
GFR calc non Af Amer: >90 mL/min (ref >90)
Glucose, Bld: 104 mg/dL — ABNORMAL HIGH (ref 70–99)
POTASSIUM: 3.5 mmol/L (ref 3.5–5.1)
Sodium: 140 mmol/L (ref 135–145)

## 2014-10-04 LAB — GLUCOSE, CAPILLARY
Glucose-Capillary: 94 mg/dL (ref 70–99)
Glucose-Capillary: 160 mg/dL — ABNORMAL HIGH (ref 70–99)
Glucose-Capillary: 134 mg/dL — ABNORMAL HIGH (ref 70–99)
Glucose-Capillary: 150 mg/dL — ABNORMAL HIGH (ref 70–99)

## 2014-10-04 LAB — CBC
HCT: 27.8 % — ABNORMAL LOW (ref 39.0–52.0)
Hemoglobin: 8 g/dL — ABNORMAL LOW (ref 13.0–17.0)
MCH: 27.2 pg (ref 26.0–34.0)
MCHC: 28.8 g/dL — ABNORMAL LOW (ref 30.0–36.0)
MCV: 94.6 fL (ref 78.0–100.0)
Platelets: 889 10*3/uL — ABNORMAL HIGH (ref 150–400)
RBC: 2.94 MIL/uL — AB (ref 4.22–5.81)
RDW: 18.3 % — ABNORMAL HIGH (ref 11.5–15.5)
WBC: 15.8 10*3/uL — AB (ref 4.0–10.5)

## 2014-10-04 LAB — PROTIME-INR
INR: 2.62 — AB (ref 0.00–1.49)
Prothrombin Time: 28.2 seconds — ABNORMAL HIGH (ref 11.6–15.2)

## 2014-10-04 LAB — LACTATE DEHYDROGENASE: LDH: 305 U/L — AB (ref 94–250)

## 2014-10-04 MED ORDER — LEVALBUTEROL HCL 1.25 MG/0.5ML IN NEBU
1.2500 mg | INHALATION_SOLUTION | Freq: Four times a day (QID) | RESPIRATORY_TRACT | Status: DC | PRN
  Filled 2014-10-04: qty 0.5

## 2014-10-04 MED ORDER — WARFARIN SODIUM 2 MG PO TABS
2.0000 mg | ORAL_TABLET | Freq: Once | ORAL | Status: DC
  Filled 2014-10-04: qty 1

## 2014-10-04 NOTE — Progress Notes (Signed)
HeartMate 2 Rounding Note  Subjective:   POD #13   VAD implant  (DT)  For acute /chronic systolic HF from ischemic cardiomyopathy and cardiogenic shock on preop IABP  Recent PE- with large R pulmonary infarct,necrotic    lung/pneumonia- poss empyema on IV Zosyn- brown sputum Restrictive lung disease- FEV1 1.3 from ankylosing spondylitis DM  HX ventricular, atrial arrhythmia Postoperative CO2 retention requiring BiPAP-transferred to ICU for hypercarbia-appears much improved today will cont BIPAP q hs  and off during day for meals and PT-ambulation     VAD flows and PI improved after lasix stopped-no PI events in last 48 hrs Ileus resolved-  TNA stopped, reglan stopped Maintaining NSR on po amiodarone Pocket drainout    Bilateral rales  Resolved and CXR clearing with gentle diuresis Lower sternal incision continues  with no drainage - cont betadine swab and dry dressing Pump parameters are satisfactory no PI events MAP now 85-    INR slowly rising - now 2.6 - coumadin per PharmD   HeartMate II LVAD:  Flow 5.5 liters/min, speed 9400 rpm power 5.1, PI  5.0  Controller intact  Objective:    Vital Signs:   Temp:  [97.5 F (36.4 C)-98.4 F (36.9 C)] 97.9 F (36.6 C) (04/04 0809) Pulse Rate:  [74-94] 80 (04/04 0809) Resp:  [18-35] 35 (04/04 0352) SpO2:  [93 %-100 %] 93 % (04/04 0809) FiO2 (%):  [30 %] 30 % (04/04 0124) Weight:  [188 lb 15 oz (85.7 kg)] 188 lb 15 oz (85.7 kg) (04/04 0313) Last BM Date: 10/03/14 Mean arterial Pressure 78  Intake/Output:   Intake/Output Summary (Last 24 hours) at 10/04/14 0938 Last data filed at 10/04/14 0300  Gross per 24 hour  Intake    700 ml  Output   1200 ml  Net   -500 ml     Physical Exam: General:alert off BIPAP HEENT: normal Neck: supple. JVP . Carotids 2+ bilat; no bruits. No lymphadenopathy or thryomegaly appreciated. Cor: Mechanical heart sounds with LVAD hum present. Lungs: bibasilar rales Abdomen: soft, nontender,  nondistended. No hepatosplenomegaly. No bruits or masses. Good bowel sounds. Driveline: C/D/I; securement device intact and driveline incorporated Extremities: warm well perfused,no cyanosis, clubbing, rash, edema- IABP out without hematoma and groin- c/o pain in R middle finger - prob related to a-line Neuro: moves all extremities Sternal incision dry   Telemetry:nsr  Labs: Basic Metabolic Panel:  Recent Labs Lab 09/28/14 0530 09/30/14 1150 10/01/14 0401 10/03/14 0509 10/04/14 0554  NA 137 140 140 138 140  K 4.1 4.5 4.3 3.7 3.5  CL 93* 92* 94* 92* 94*  CO2 39* 38* 39* 37* 38*  GLUCOSE 98 131* 117* 108* 104*  BUN 19 12 10 8 7   CREATININE 0.81 0.76 0.81 0.69 0.60  CALCIUM 8.4 8.7 8.6 8.1* 8.0*  MG 2.1 2.1  --   --   --     Liver Function Tests:  Recent Labs Lab 09/28/14 0530 10/01/14 0401 10/03/14 0509  AST 64* 51* 50*  ALT 77* 59* 53  ALKPHOS 182* 266* 216*  BILITOT 1.0 1.0 0.9  PROT 5.8* 6.9 5.9*  ALBUMIN 2.1* 2.4* 2.1*   No results for input(s): LIPASE, AMYLASE in the last 168 hours.  Recent Labs Lab 09/30/14 1445  AMMONIA 46*    CBC:  Recent Labs Lab 09/29/14 0530 09/30/14 0510 10/01/14 0401 10/02/14 0516 10/03/14 0509  WBC 15.6* 14.5* 15.3* 13.7* 15.3*  HGB 8.7* 8.6* 8.9* 7.9* 8.0*  HCT 29.6* 29.6* 31.9*  28.1* 27.1*  MCV 94.0 94.6 96.7 96.2 93.1  PLT 576* 659* 812* 821* 802*    INR:  Recent Labs Lab 09/30/14 0510 10/01/14 0401 10/02/14 0516 10/03/14 0509 10/04/14 0554  INR 2.64* 2.37* 2.54* 2.69* 2.62*    Other results:  EKG:   Imaging: Dg Chest Port 1 View  10/03/2014   CLINICAL DATA:  Acute on chronic congestive heart failure. Left ventricular assist device. Pneumonia.  EXAM: PORTABLE CHEST - 1 VIEW  COMPARISON:  10/01/2014  FINDINGS: Left ventricular assist device and right arm PICC line remain in place. Cardiomegaly stable. There is persistent opacity in the left retrocardiac lung base which is unchanged. There is also focal  consolidation seen in the lateral right lung base which is unchanged. No pneumothorax visualized.  IMPRESSION: No significant change in left retrocardiac opacity and focal consolidation in the lateral right lung base.   Electronically Signed   By: Earle Gell M.D.   On: 10/03/2014 09:20     Medications:     Scheduled Medications: . amiodarone  200 mg Oral BID  . aspirin EC  325 mg Oral Daily  . bisacodyl  10 mg Oral Daily  . budesonide-formoterol  2 puff Inhalation BID  . dextromethorphan-guaiFENesin  1 tablet Oral BID  . docusate sodium  200 mg Oral Daily  . ferrous [id number] C-folic acid  1 capsule Oral TID PC  . furosemide  20 mg Oral BID  . gabapentin  300 mg Oral BID  . insulin aspart  0-24 Units Subcutaneous TID AC & HS  . levalbuterol  1.25 mg Nebulization 4 times per day  . pantoprazole  40 mg Oral Daily  . piperacillin-tazobactam (ZOSYN)  IV  3.375 g Intravenous 3 times per day  . polyethylene glycol  17 g Oral Daily  . rosuvastatin  10 mg Oral q1800  . sodium chloride  10-40 mL Intracatheter Q12H  . sodium chloride  3 mL Intravenous Q12H  . traZODone  50 mg Oral QHS  . Warfarin - Pharmacist Dosing Inpatient   Does not apply q1800    Infusions: . sodium chloride    . sodium chloride 20 mL/hr at 09/23/14 1900    PRN Medications: sodium chloride, magnesium hydroxide, ondansetron (ZOFRAN) IV, sodium chloride, sodium chloride, traMADol   Assessment:  Stable hemodynamics- , VAD speed 9400 C02 improved off BiPAP- persistent 02 requirement, cont BIPAP q hs at home  Cont IV Zosyn for possible pneumonia, empyema from past pulmonary infarct  Plan/Discussion:     BIPAP each night Cont with VAD teaching Increase mobility- walked 350 ft yesterday Coumadin per pharmD goal 2.5-3 DC EPWs just before DC Add Ensure to supp diet Liberalize to regular diet to improve PO intake Echo speed study  Today Target date for DC April 8   I reviewed the LVAD parameters  from today, and compared the results to the patient's prior recorded data.  No programming changes were made.  The LVAD is functioning within specified parameters.  The patient performs LVAD self-test daily.  LVAD interrogation was negative for any significant power changes, alarms or PI events/speed drops.  LVAD equipment check completed and is in good working order.  Back-up equipment present.   LVAD education done on emergency procedures and precautions and reviewed exit site care.  Length of Stay: [place] III 10/04/2014, 9:38 AM

## 2014-10-04 NOTE — Progress Notes (Signed)
Drive line dressing change done by patients wife. Patients RN in room, site slightly pink with some drainage noted on old dressing. Will continue to monitor patient. Kalei Mckillop, Bettina Gavia RN

## 2014-10-04 NOTE — Progress Notes (Signed)
Echocardiogram 2D Echocardiogram limited RAMP Study has been performed.  Scott [name] 10/04/2014, 1:39 PM

## 2014-10-04 NOTE — Progress Notes (Signed)
Nursing note Patient arrived to 2 west 23, patients wife placed pt on wall unit power from batteries. Patient was placed on monitor. Patients MAP 88 upon transfer. Pt resting in bed will monitor patient. Wife at bedside./ Tanji Storrs, Bettina Gavia RN

## 2014-10-04 NOTE — Progress Notes (Addendum)
ANTICOAGULATION CONSULT NOTE - Follow Up Consult  Pharmacy Consult for Coumadin Indication: hx PE/DVT, LVAD  No Known Allergies  Patient Measurements: Height: 5\' 8"  (172.7 cm) Weight: 188 lb 15 oz (85.7 kg) IBW/kg (Calculated) : 68.4  Vital Signs: Temp: 97.9 F (36.6 C) (04/04 0809) Temp Source: Oral (04/04 0809) Pulse Rate: 80 (04/04 0809)  Labs:  Recent Labs  10/02/14 0516 10/03/14 0509 10/04/14 0554  HGB 7.9* 8.0*  --   HCT 28.1* 27.1*  --   PLT 821* 802*  --   LABPROT 27.6* 28.8* 28.2*  INR 2.54* 2.69* 2.62*  CREATININE  --  0.69 0.60    Estimated Creatinine Clearance: 96.7 mL/min (by C-G formula based on Cr of 0.6).  Assessment; 68yom on coumadin pta for recent bilateral PE and RLE DVT s/p IVC filter (08/2014), admitted 3/11 with low output heart failure. Coumadin was held and he was bridged with heparin pending his LVAD on 3/22. Coumadin resumed 3/23. INR remains therapeutic at 2.62. He was started on amiodarone post-op which has now been changed to 200mg  PO bid. LDH stable.  Home dose: 4mg  daily except 2mg  on Mon/Fri  Goal of Therapy:  INR 2-3 Monitor platelets by anticoagulation protocol: Yes   Plan:  1) Coumadin 2mg  x 1 tonight 2) INR in AM  Deboraha Sprang 10/04/2014,11:22 AM  Addendum: Damaris Schooner to Dr. Darcey Nora - he is going to pull temporary pacing wires and therefore wants to hold coumadin tonight and resume tomorrow.  Deboraha Sprang 10/04/2014, 2:03 PM

## 2014-10-04 NOTE — Progress Notes (Signed)
Speed  Flow  PI  Power  LVIDD    AI    Aortic openings    MR   TR  Septum     RV   MAP   9400  6.0 5.4 6.3 6.68 cm Mild      5/5 none - trivial   Mild Sl bow to Right Reduced & sl dilated   78  9600  6.9 3.6 6.9 6.69 cm Mild to mod  Mostly opening x closed every 3rd-4th none mild Sl bow to right  Reduced & sl dilated  80  9800  7.1 3.8 7.3 6.36 cm Mild to mod 2:1 opening none mild Sl bow to the right Reduced & sl dilated   10000  7.2 3.9 7.8 5.93 cm  Mild Every other  none  midline  82  10200  7.4 3.8 8.1 6.06 cm  0/5    Midline, pulls to left some     Starting Doppler MAP: 78   Ramp ECHO performed at bedside with Dr. Aundra Dubin and Colletta Maryland.  At completion of ramp study, patients primary and back up controller programmed to the below settings:   Fixed speed: 10,000 RPM Low speed limit: 9400 RPM

## 2014-10-04 NOTE — Progress Notes (Signed)
CARDIAC REHAB PHASE I   PRE:  Rate/Rhythm: 99SR  BP:  Supine:   Sitting: 82 map  Standing:    SaO2: 99%RA  MODE:  Ambulation: 910 ft   POST:  Rate/Rhythm: 110 ST  BP:  Supine:   Sitting: 98 map, 100 map 2 mins   Standing:    SaO2: 93%hall, 91-97% RA room 1335-1430 Pt's wife switched pt to batteries with supervision. Pt did self test. Pt walked 910 ft on RA with rollator and denied dizziness. Back started to bother him. Pt sat once on rollator to rest. Checked sats at 93%RA.  Pt very determined to go farther. Back up bag taken with [place]. BP elevated after walk with MAP at 98-100. Notified pt's RN. Left pt to batteries for bathroom and then for transfer to 2W. Tolerated well.   Graylon Good, RN BSN  10/04/2014 2:21 PM

## 2014-10-04 NOTE — Progress Notes (Signed)
Patient ID: Scott [name], male   DOB: [birth year], 69 y.o.   MRN: [id number] HeartMate 2 Rounding Note  Subjective:    Post Day 13 - HMII LVAD   Moved to ICU 3/31 for recurrent CO2 retention. Improved with Bipap. This am doing great. Off bipap during day, using at night.   On Zosyn for ?PNA x 7 day course. Weight stable, getting Lasix 20 bid. Maintaining NSR on amio.   LVAD INTERROGATION:  HeartMate II LVAD:  Flow 5.6 liters/min, speed 9400 power 6.1, PI 4.8   No PI events.  Objective:    Vital Signs:   Temp:  [97.5 F (36.4 C)-98.4 F (36.9 C)] 97.5 F (36.4 C) (04/04 0313) Pulse Rate:  [74-94] 79 (04/04 0352) Resp:  [18-35] 35 (04/04 0352) SpO2:  [93 %-100 %] 98 % (04/04 0352) FiO2 (%):  [30 %] 30 % (04/04 0124) Weight:  [188 lb 15 oz (85.7 kg)-189 lb 8 oz (85.957 kg)] 188 lb 15 oz (85.7 kg) (04/04 0313) Last BM Date: 10/03/14 Mean arterial Pressure  80s  Intake/Output:   Intake/Output Summary (Last 24 hours) at 10/04/14 0740 Last data filed at 10/04/14 0300  Gross per 24 hour  Intake    940 ml  Output   1350 ml  Net   -410 ml     Physical Exam: General:  sitting in bed. On bipap lethargic but awakens to voice HEENT: normal Neck: supple. Carotids 2+ bilat; no bruits. No lymphadenopathy or thryomegaly appreciated.  Cor: Mechanical heart sounds with LVAD hum present.  Lungs: clear Abdomen: soft, nontender, nondistended. No hepatosplenomegaly. No bruits or masses. Good bowel sounds. Driveline: Dressing looks good.  Extremities: no cyanosis, clubbing, rash.  No edema.  Neuro: Lethargic but awakens and follows commands.  Telemetry: NSR 80-90s   Labs: Basic Metabolic Panel:  Recent Labs Lab 09/28/14 0530 09/30/14 1150 10/01/14 0401 10/03/14 0509 10/04/14 0554  NA 137 140 140 138 140  K 4.1 4.5 4.3 3.7 3.5  CL 93* 92* 94* 92* 94*  CO2 39* 38* 39* 37* 38*  GLUCOSE 98 131* 117* 108* 104*  BUN 19 12 10 8 7   CREATININE 0.81 0.76 0.81 0.69 0.60  CALCIUM  8.4 8.7 8.6 8.1* 8.0*  MG 2.1 2.1  --   --   --     Liver Function Tests:  Recent Labs Lab 09/28/14 0530 10/01/14 0401 10/03/14 0509  AST 64* 51* 50*  ALT 77* 59* 53  ALKPHOS 182* 266* 216*  BILITOT 1.0 1.0 0.9  PROT 5.8* 6.9 5.9*  ALBUMIN 2.1* 2.4* 2.1*   No results for input(s): LIPASE, AMYLASE in the last 168 hours.  Recent Labs Lab 09/30/14 1445  AMMONIA 46*    CBC:  Recent Labs Lab 09/29/14 0530 09/30/14 0510 10/01/14 0401 10/02/14 0516 10/03/14 0509  WBC 15.6* 14.5* 15.3* 13.7* 15.3*  HGB 8.7* 8.6* 8.9* 7.9* 8.0*  HCT 29.6* 29.6* 31.9* 28.1* 27.1*  MCV 94.0 94.6 96.7 96.2 93.1  PLT 576* 659* 812* 821* 802*    INR:  Recent Labs Lab 09/30/14 0510 10/01/14 0401 10/02/14 0516 10/03/14 0509 10/04/14 0554  INR 2.64* 2.37* 2.54* 2.69* 2.62*    Other results:    Imaging: Dg Chest Port 1 View  10/03/2014   CLINICAL DATA:  Acute on chronic congestive heart failure. Left ventricular assist device. Pneumonia.  EXAM: PORTABLE CHEST - 1 VIEW  COMPARISON:  10/01/2014  FINDINGS: Left ventricular assist device and right arm PICC line remain in place.  Cardiomegaly stable. There is persistent opacity in the left retrocardiac lung base which is unchanged. There is also focal consolidation seen in the lateral right lung base which is unchanged. No pneumothorax visualized.  IMPRESSION: No significant change in left retrocardiac opacity and focal consolidation in the lateral right lung base.   Electronically Signed   By: Earle Gell M.D.   On: 10/03/2014 09:20     Medications:     Scheduled Medications: . amiodarone  200 mg Oral BID  . aspirin EC  325 mg Oral Daily  . bisacodyl  10 mg Oral Daily  . budesonide-formoterol  2 puff Inhalation BID  . dextromethorphan-guaiFENesin  1 tablet Oral BID  . docusate sodium  200 mg Oral Daily  . ferrous [id number] C-folic acid  1 capsule Oral TID PC  . furosemide  20 mg Oral BID  . gabapentin  300 mg Oral BID   . insulin aspart  0-24 Units Subcutaneous TID AC & HS  . levalbuterol  1.25 mg Nebulization 4 times per day  . pantoprazole  40 mg Oral Daily  . piperacillin-tazobactam (ZOSYN)  IV  3.375 g Intravenous 3 times per day  . polyethylene glycol  17 g Oral Daily  . rosuvastatin  10 mg Oral q1800  . sodium chloride  10-40 mL Intracatheter Q12H  . sodium chloride  3 mL Intravenous Q12H  . traZODone  50 mg Oral QHS  . Warfarin - Pharmacist Dosing Inpatient   Does not apply q1800    Infusions: . sodium chloride    . sodium chloride 20 mL/hr at 09/23/14 1900    PRN Medications: sodium chloride, magnesium hydroxide, ondansetron (ZOFRAN) IV, sodium chloride, sodium chloride, traMADol   Assessment:    1.  A/C systolic HF class IV with cardiogenic shock- EF 15% with moderate RV dysfunction 3/16 echo.         -S/P HMII LVAD 09/21/14  2.  CAD s/p Anterior STEMI on 06/12/14 with stenting of LAD- on coumadin + plavix, statin.  3. Acute on chronic hypercarbic respiratory failure  4.  06/12/14 VT in setting of STEMI 5.  Ankylosing spondylitis. 6. Bilateral PE with R DVT- S/P IVC filter 08/27/2014--->coumadin 7. Pleural effusion, transudative  8. ?PNA: Zosyn 9. Acute Blood Loss- Anemia- Received 1UPRBCs 3/23  Plan/Discussion:    POD #12 HMII LVAD.   Doing well except for recurrent hypercarbic respiratory issues. Will have him use bipap every night and then try to keep him off during the day (doing well with this). Encourage ambulation and frequent incentive spirometry.   Continue Zosyn for 7 day course.   Weight currently stable, getting Lasix 20 bid.  Maintaining NSR on po amio.   On coumadin and LDH stable.  Will not restart Plavix as anterior wall completely necrotic.    Ramp echo today.  Will check co-ox in am.   May go to 2W.   I reviewed the LVAD parameters from today, and compared the results to the patient's prior recorded data.  No programming changes were made.  The LVAD  is functioning within specified parameters.  The patient performs LVAD self-test daily.  LVAD interrogation was negative for any significant power changes, alarms or PI events/speed drops.  LVAD equipment check completed and is in good working order.  Back-up equipment present.   LVAD education done on emergency procedures and precautions and reviewed exit site care.  Length of Stay: 24  Loralie Champagne  MD  10/04/2014, 7:40 AM  VAD Team ---  VAD ISSUES ONLY--- Pager [phone] (7am - 7am)  Advanced Heart Failure Team  Pager [phone] (M-F; [place])  Please contact [facility] for night-coverage after hours (4p -7a ) and weekends on [email or web address]

## 2014-10-04 NOTE — Progress Notes (Signed)
Physical Therapy Treatment Patient Details Name: Scott [name] MRN: [id number] DOB: 1945-11-24 Today's Date: 10/04/2014    History of Present Illness 69 y.o. M with extensive cardiac hx as well as recent PE / DVT, admitted for SOB due to heart failure, cardiogenic, shock, PE. Thought to be good candidate for VAD (to be evaluated by VAD team 3/17). Found to have mod - large R pleural effusion. LVAD implantation 3/23    PT Comments    Pt has progressed well. Lengthy discussion re: need for RW on discharge (pt was hoping not to need one, but ultimately agreed the advantage of having a seat to rest on when in community would be helpful). Wife concerned re: her ability to fold and load a typical 4 wheeled walker with seat (rollator) and educated on possibility of more typical RW with fold down seat as an option. Will plan to educate pt on this style walker during therapy sessions prior to anticipated d/c Friday 4/8 for final equipment needs.   Follow Up Recommendations  Home health PT;Supervision/Assistance - 24 hour     Equipment Recommendations  Rolling walker with 5" wheels (with fold down seat vs 4 wheeled RW/rollator)    Recommendations for Other Services       Precautions / Restrictions Precautions Precautions: Sternal;Fall Precaution Comments: LVAD; able to state sternal precautions; wife able to recall need for "travel bag" when leaving room    Mobility  Bed Mobility               General bed mobility comments: sitting EOB with wife, RN on arrival and departure  Transfers Overall transfer level: Needs assistance Equipment used: Rolling walker (2 wheeled) Transfers: Sit to/from Stand Sit to Stand: Supervision         General transfer comment: reinforced cues for hand placement; pt prefers hands on hand grips to remind him not to push up with arms; no LOB however educated on safety issues  Ambulation/Gait Ambulation/Gait assistance: Supervision Ambulation  Distance (Feet): 600 Feet (seated rest x 90 seconds; 200 ft) Assistive device: Rolling walker (2 wheeled) Gait Pattern/deviations: Step-through pattern;Decreased stride length;Trunk flexed     General Gait Details: steady; pushes RW too far ahead (although RW set too high); kyphotic posture (pt reports unable to reverse and stand erect due to ankylosing spondilitis)   Stairs            Wheelchair Mobility    Modified Rankin (Stroke Patients Only)       Balance     Sitting balance-Leahy Scale: Good       Standing balance-Leahy Scale: Fair                      Cognition Arousal/Alertness: Awake/alert Behavior During Therapy: Flat affect Overall Cognitive Status: Within Functional Limits for tasks assessed       Memory: Decreased short-term memory              Exercises      General Comments General comments (skin integrity, edema, etc.): Wife switching pt to batteries on arrival (pt's Right hand remains edematous and he reports he cannot grip equipment); wife interrupted after switching one battery by LVAD team needing to borrow his equipment/cart for surgical pt and she lost her place and needed prompting to switch pt to his second battery; no problems returning pt to wall unit on return to room      Pertinent Vitals/Pain Pain Assessment: No/denies pain    Home Living  Prior Function            PT Goals (current goals can now be found in the care plan section) Acute Rehab PT Goals Patient Stated Goal: work on cars PT Goal Formulation: With patient/family Time For Goal Achievement: 10/14/14 Potential to Achieve Goals: Good Progress towards PT goals: Goals met and updated - see care plan    Frequency  Min 3X/week    PT Plan Current plan remains appropriate    Co-evaluation             End of Session Equipment Utilized During Treatment: Oxygen Activity Tolerance: Patient tolerated treatment  well Patient left: with call bell/phone within reach;with family/visitor present (sit EOB)     Time: 0233-4356 PT Time Calculation (min) (ACUTE ONLY): 36 min  Charges:  $Gait Training: 23-37 mins                    G Codes:      Olesya Wike 10-17-14, 9:43 AM Pager [phone]

## 2014-10-04 NOTE — Progress Notes (Signed)
Occupational Therapy Treatment Patient Details Name: Scott Scott [name] MRN: [id number] DOB: 02/11/1946 Today's Date: 10/04/2014    History of present illness 69 y.o. Scott [name] with extensive cardiac hx as well as recent PE / DVT, admitted for SOB due to heart failure, cardiogenic, shock, PE. Thought to be good candidate for VAD (to be evaluated by VAD team 3/17). Found to have mod - large R pleural effusion. LVAD implantation 3/23   OT comments  Pt was instructed in HEP for Rt hand and index finger.  Pt with limited PIP ROM.  Gently PROM and AAROM performed with improvement in ROM.  Pt also instructed in desensitization as it is hypersensitive to touch and provided with therapy ball to begin gentle strengthening.  Pt is very resistant to increasing use of Rt UE stating "I'Scott [name] not ready for this, it's weak".   Pt awaiting drive line dressing change and deferred OOB with OT.  Reviewed safety issues at home.  Pt states he plans to sit on edge of tub to sponge bathe.  Long discussion with he and wife re: tubs being very low surfaces and pt has sternal precautions and is not able to push up with UEs which may place him at risk for fall or injury.  Pt is adamant that he will be fine stating "we've already figured it out... If there's a will there's a way".  Need to practice sit to stand from low surfaces to see if pt is able to perform safely without UE use.  Pt is resistant to practicing.    Follow Up Recommendations  Home health OT    Equipment Recommendations  3 in 1 bedside comode;Tub/shower seat    Recommendations for Other Services      Precautions / Restrictions Precautions Precautions: Sternal;Fall Precaution Comments: LVAD and sternal precautions.       Mobility Bed Mobility                  Transfers                      Balance                                   ADL Overall ADL's : Needs assistance/impaired                                        General ADL Comments: Pt deferred OOB this pm due to plan to change drive line dressing.   Discussed options for RW with pt and wife, and they will follow up with PT Reinforced need for walker with a seat for initial safety.  Pt states he plans to sponge bathe sitting on the edge of the tub at home.  Disucssed that tubs are generally very low which places him at risk for fall or injury.  He insists his tub is higher than the standard tub but then contradicted himself later in conversation.   He is insistent that he can perform sponge bath in this fashion and is unable to problem solve through the safety implications.   Instructed he and wife we would practice sit to stand from low surfaces without UE support to ensure he is able to do this safely.   Also discussed need for a tub seat long term as that is the  recommendation for all LVAD pt's.  Wife states "oh we have a ledge on the tub he can sit on if we need it".  Again explained that the edge of a tub is not a suitable tub seat if the need arises that he should need to sit. Reinforced no showering until dive line seals and MD clears him.       Vision                     Perception     Praxis      Cognition   Behavior During Therapy: Flat affect Overall Cognitive Status: Within Functional Limits for tasks assessed       Memory: Decreased short-term memory               Extremity/Trunk Assessment               Exercises Other Exercises Other Exercises: Pt is still resistant to using Rt hand for activity fully.  He states is weak and it hurts (pins and needles sensation).  PIP limited to -80* flexion actively and -40 passively.  performed gentle passive stretches to PIP with ROM passively and AAROM -15.  Pt provided with therapy ball.  He states he is too weak to use it.  Encouraged him to use it gently and he doesn't have to fully squeeze ball, but it would help if he would start with small amounts of pressure.   Also instructed pt in desensitization techniques.  Instructed him to rub index finger with a wash cloth x 20-30 seconds hourly.   Currently, he only tolerates about 10 seconds    Shoulder Instructions       General Comments      Pertinent Vitals/ Pain       Pain Assessment: No/denies pain  Home Living                                          Prior Functioning/Environment              Frequency Min 2X/week     Progress Toward Goals  OT Goals(current goals can now be found in the care plan section)  Progress towards OT goals: Progressing toward goals  Acute Rehab OT Goals Patient Stated Goal: to go home  OT Goal Formulation: With patient Time For Goal Achievement: 10/18/14 Potential to Achieve Goals: Good ADL Goals Pt Will Perform Grooming: with supervision;standing Pt Will Perform Upper Body Bathing: with supervision;sitting Pt Will Perform Lower Body Bathing: with supervision;sit to/from stand Pt Will Perform Upper Body Dressing: sitting;with supervision Pt Will Perform Lower Body Dressing: with supervision;sit to/from stand Pt Will Transfer to Toilet: with supervision;ambulating Pt Will Perform Toileting - Clothing Manipulation and hygiene: with supervision;sit to/from stand Additional ADL Goal #1: Pt will utilize energy conservation strategies in ADL and mobility independently. Additional ADL Goal #2: Pt will be independet with HEP for Rt hand and with desinsitization exercises   Plan Discharge plan remains appropriate;Equipment recommendations need to be updated;Other (comment) (goals updated )    Co-evaluation                 End of Session     Activity Tolerance Patient tolerated treatment well   Patient Left in bed;with call bell/phone within reach;with family/visitor present   Nurse Communication Other (comment) (status of session )  Time: 6579-0383 OT Time Calculation (min): 18 min  Charges: OT General Charges $OT  Visit: 1 Procedure OT Treatments $Self Care/Home Management : 8-22 mins  Scott Scott [name] 10/04/2014, 3:53 PM

## 2014-10-04 NOTE — Progress Notes (Signed)
Placed pt on same settings that were documented from previous night.

## 2014-10-04 NOTE — Progress Notes (Signed)
CSW met with patient and wife at bedside. Patient shared the events of the past few days and reports he will be moved back to 2W this afternoon. Patient stated he feels some improvement and learning the how to manage the equipment. Wife reports that both she and the back up caregiver (neighbor) have been trained and she feels more confident about all the equipment. CSW spoke of the Buddy program thru the LVAD Support [facility] and both patient and wife are open to it. Patient mentioned that he "does not want to get too involved but would like the number for a Buddy when I go home". Patient shared that he is a quiet private man. He stated that his wife is the talker and she responded "he asks the question and I handle the details". Patient and wife appear to be coping well under stressful conditions and work well together. CSW will continue to follow for support throughout recovery. Raquel Sarna, [place]

## 2014-10-05 ENCOUNTER — Inpatient Hospital Stay (HOSPITAL_COMMUNITY): Payer: Medicare Other

## 2014-10-05 ENCOUNTER — Encounter (HOSPITAL_COMMUNITY)
Admission: AD | Disposition: A | Discharge status: Home Health | Payer: Medicare Other | Source: Ambulatory Visit | Attending: Cardiothoracic Surgery

## 2014-10-05 ENCOUNTER — Inpatient Hospital Stay (HOSPITAL_COMMUNITY): Payer: Medicare Other | Admitting: Certified Registered"

## 2014-10-05 ENCOUNTER — Encounter (HOSPITAL_COMMUNITY): Payer: Self-pay | Admitting: Anesthesiology

## 2014-10-05 DIAGNOSIS — I509 Heart failure, unspecified: Secondary | ICD-10-CM

## 2014-10-05 DIAGNOSIS — T829XXA Unspecified complication of cardiac and vascular prosthetic device, implant and graft, initial encounter: Secondary | ICD-10-CM | POA: Diagnosis present

## 2014-10-05 DIAGNOSIS — J942 Hemothorax: Secondary | ICD-10-CM

## 2014-10-05 DIAGNOSIS — J948 Other specified pleural conditions: Secondary | ICD-10-CM

## 2014-10-05 HISTORY — PX: BEDSIDE EVACUATION OF HEMATOMA: SHX6535

## 2014-10-05 HISTORY — PX: TEE WITHOUT CARDIOVERSION: SHX5443

## 2014-10-05 LAB — CBC
HCT: 23.9 % — ABNORMAL LOW (ref 39.0–52.0)
HCT: 26.7 % — ABNORMAL LOW (ref 39.0–52.0)
HCT: 22.8 % — ABNORMAL LOW (ref 39.0–52.0)
HCT: 26.6 % — ABNORMAL LOW (ref 39.0–52.0)
HEMOGLOBIN: 7.2 g/dL — AB (ref 13.0–17.0)
HEMOGLOBIN: 8.5 g/dL — AB (ref 13.0–17.0)
HEMOGLOBIN: 7.6 g/dL — AB (ref 13.0–17.0)
HEMOGLOBIN: 9.1 g/dL — AB (ref 13.0–17.0)
MCH: 27.8 pg (ref 26.0–34.0)
MCH: 28.1 pg (ref 26.0–34.0)
MCH: 29 pg (ref 26.0–34.0)
MCH: 29.3 pg (ref 26.0–34.0)
MCHC: 30.1 g/dL (ref 30.0–36.0)
MCHC: 31.8 g/dL (ref 30.0–36.0)
MCHC: 33.3 g/dL (ref 30.0–36.0)
MCHC: 34.2 g/dL (ref 30.0–36.0)
MCV: 92.3 fL (ref 78.0–100.0)
MCV: 88.1 fL (ref 78.0–100.0)
MCV: 87 fL (ref 78.0–100.0)
MCV: 85.5 fL (ref 78.0–100.0)
PLATELETS: 828 10*3/uL — AB (ref 150–400)
PLATELETS: 463 10*3/uL — AB (ref 150–400)
PLATELETS: 344 10*3/uL (ref 150–400)
PLATELETS: 309 10*3/uL (ref 150–400)
RBC: 2.59 MIL/uL — AB (ref 4.22–5.81)
RBC: 3.03 MIL/uL — AB (ref 4.22–5.81)
RBC: 2.62 MIL/uL — ABNORMAL LOW (ref 4.22–5.81)
RBC: 3.11 MIL/uL — AB (ref 4.22–5.81)
RDW: 18.5 % — ABNORMAL HIGH (ref 11.5–15.5)
RDW: 16.7 % — ABNORMAL HIGH (ref 11.5–15.5)
RDW: 17.3 % — AB (ref 11.5–15.5)
RDW: 15.8 % — ABNORMAL HIGH (ref 11.5–15.5)
WBC: 26.8 10*3/uL — ABNORMAL HIGH (ref 4.0–10.5)
WBC: 23.1 10*3/uL — AB (ref 4.0–10.5)
WBC: 22.6 10*3/uL — ABNORMAL HIGH (ref 4.0–10.5)
WBC: 18.9 10*3/uL — AB (ref 4.0–10.5)

## 2014-10-05 LAB — BASIC METABOLIC PANEL
ANION GAP: 7 (ref 5–15)
BUN: 10 mg/dL (ref 6–23)
CHLORIDE: 92 mmol/L — AB (ref 96–112)
CO2: 34 mmol/L — AB (ref 19–32)
CREATININE: 0.72 mg/dL (ref 0.50–1.35)
Calcium: 8.2 mg/dL — ABNORMAL LOW (ref 8.4–10.5)
GFR calc Af Amer: >90 mL/min (ref >90)
GFR calc non Af Amer: >90 mL/min (ref >90)
Glucose, Bld: 162 mg/dL — ABNORMAL HIGH (ref 70–99)
Potassium: 3.9 mmol/L (ref 3.5–5.1)
Sodium: 133 mmol/L — ABNORMAL LOW (ref 135–145)

## 2014-10-05 LAB — POCT I-STAT, CHEM 8
BUN: 14 mg/dL (ref 6–23)
BUN: 17 mg/dL (ref 6–23)
BUN: 13 mg/dL (ref 6–23)
BUN: 14 mg/dL (ref 6–23)
CALCIUM ION: 1.02 mmol/L — AB (ref 1.13–1.30)
CALCIUM ION: 1.1 mmol/L — AB (ref 1.13–1.30)
CHLORIDE: 94 mmol/L — AB (ref 96–112)
CREATININE: 0.8 mg/dL (ref 0.50–1.35)
CREATININE: 0.7 mg/dL (ref 0.50–1.35)
Calcium, Ion: 1.11 mmol/L — ABNORMAL LOW (ref 1.13–1.30)
Calcium, Ion: 1.22 mmol/L (ref 1.13–1.30)
Chloride: 93 mmol/L — ABNORMAL LOW (ref 96–112)
Chloride: 94 mmol/L — ABNORMAL LOW (ref 96–112)
Chloride: 99 mmol/L (ref 96–112)
Creatinine, Ser: 0.7 mg/dL (ref 0.50–1.35)
Creatinine, Ser: 0.7 mg/dL (ref 0.50–1.35)
GLUCOSE: 194 mg/dL — AB (ref 70–99)
GLUCOSE: 207 mg/dL — AB (ref 70–99)
GLUCOSE: 122 mg/dL — AB (ref 70–99)
Glucose, Bld: 178 mg/dL — ABNORMAL HIGH (ref 70–99)
HCT: 28 % — ABNORMAL LOW (ref 39.0–52.0)
HCT: 30 % — ABNORMAL LOW (ref 39.0–52.0)
HCT: 29 % — ABNORMAL LOW (ref 39.0–52.0)
HCT: 27 % — ABNORMAL LOW (ref 39.0–52.0)
HEMOGLOBIN: 10.2 g/dL — AB (ref 13.0–17.0)
HEMOGLOBIN: 9.5 g/dL — AB (ref 13.0–17.0)
Hemoglobin: 9.9 g/dL — ABNORMAL LOW (ref 13.0–17.0)
Hemoglobin: 9.2 g/dL — ABNORMAL LOW (ref 13.0–17.0)
Potassium: 4.5 mmol/L (ref 3.5–5.1)
Potassium: 4.9 mmol/L (ref 3.5–5.1)
Potassium: 3.8 mmol/L (ref 3.5–5.1)
Potassium: 4.2 mmol/L (ref 3.5–5.1)
SODIUM: 140 mmol/L (ref 135–145)
Sodium: 136 mmol/L (ref 135–145)
Sodium: 138 mmol/L (ref 135–145)
Sodium: 140 mmol/L (ref 135–145)
TCO2: 31 mmol/L (ref 0–100)
TCO2: 35 mmol/L (ref 0–100)
TCO2: 31 mmol/L (ref 0–100)
TCO2: 27 mmol/L (ref 0–100)

## 2014-10-05 LAB — POCT I-STAT 3, ART BLOOD GAS (G3+)
ACID-BASE EXCESS: 9 mmol/L — AB (ref 0.0–2.0)
ACID-BASE EXCESS: 9 mmol/L — AB (ref 0.0–2.0)
Acid-Base Excess: 13 mmol/L — ABNORMAL HIGH (ref 0.0–2.0)
Acid-Base Excess: 7 mmol/L — ABNORMAL HIGH (ref 0.0–2.0)
BICARBONATE: 34 meq/L — AB (ref 20.0–24.0)
BICARBONATE: 30.9 meq/L — AB (ref 20.0–24.0)
Bicarbonate: 40.5 mEq/L — ABNORMAL HIGH (ref 20.0–24.0)
Bicarbonate: 34.2 mEq/L — ABNORMAL HIGH (ref 20.0–24.0)
O2 SAT: 100 %
O2 Saturation: 100 %
O2 Saturation: 100 %
O2 Saturation: 99 %
PCO2 ART: 60.8 mmHg — AB (ref 35.0–45.0)
PCO2 ART: 39.3 mmHg (ref 35.0–45.0)
PH ART: 7.504 — AB (ref 7.350–7.450)
PO2 ART: 385 mmHg — AB (ref 80.0–100.0)
PO2 ART: 447 mmHg — AB (ref 80.0–100.0)
PO2 ART: 141 mmHg — AB (ref 80.0–100.0)
Patient temperature: 37.3
TCO2: 35 mmol/L (ref 0–100)
TCO2: 42 mmol/L (ref 0–100)
TCO2: 36 mmol/L (ref 0–100)
TCO2: 32 mmol/L (ref 0–100)
pCO2 arterial: 46.5 mmHg — ABNORMAL HIGH (ref 35.0–45.0)
pCO2 arterial: 63.2 mmHg (ref 35.0–45.0)
pH, Arterial: 7.432 (ref 7.350–7.450)
pH, Arterial: 7.472 — ABNORMAL HIGH (ref 7.350–7.450)
pH, Arterial: 7.362 (ref 7.350–7.450)
pO2, Arterial: 197 mmHg — ABNORMAL HIGH (ref 80.0–100.0)

## 2014-10-05 LAB — GLUCOSE, CAPILLARY
GLUCOSE-CAPILLARY: 148 mg/dL — AB (ref 70–99)
GLUCOSE-CAPILLARY: 112 mg/dL — AB (ref 70–99)
GLUCOSE-CAPILLARY: 130 mg/dL — AB (ref 70–99)
GLUCOSE-CAPILLARY: 127 mg/dL — AB (ref 70–99)
Glucose-Capillary: 111 mg/dL — ABNORMAL HIGH (ref 70–99)
Glucose-Capillary: 145 mg/dL — ABNORMAL HIGH (ref 70–99)
Glucose-Capillary: 118 mg/dL — ABNORMAL HIGH (ref 70–99)
Glucose-Capillary: 124 mg/dL — ABNORMAL HIGH (ref 70–99)

## 2014-10-05 LAB — PREPARE RBC (CROSSMATCH)

## 2014-10-05 LAB — BLOOD GAS, ARTERIAL
Acid-Base Excess: 10.7 mmol/L — ABNORMAL HIGH (ref 0.0–2.0)
Bicarbonate: 35.9 mEq/L — ABNORMAL HIGH (ref 20.0–24.0)
Drawn by: 312761
O2 Content: 4 L/min
O2 Saturation: 97.1 %
Patient temperature: 97.6
TCO2: 37.7 mmol/L (ref 0–100)
pCO2 arterial: 57.9 mmHg (ref 35.0–45.0)
pH, Arterial: 7.406 (ref 7.350–7.450)
pO2, Arterial: 88.1 mmHg (ref 80.0–100.0)

## 2014-10-05 LAB — PROTIME-INR
INR: 2.43 — AB (ref 0.00–1.49)
INR: 1.78 — ABNORMAL HIGH (ref 0.00–1.49)
INR: 1.04 (ref 0.00–1.49)
PROTHROMBIN TIME: 26.6 s — AB (ref 11.6–15.2)
Prothrombin Time: 20.8 seconds — ABNORMAL HIGH (ref 11.6–15.2)
Prothrombin Time: 13.7 seconds (ref 11.6–15.2)

## 2014-10-05 LAB — POCT I-STAT 4, (NA,K, GLUC, HGB,HCT)
Glucose, Bld: 149 mg/dL — ABNORMAL HIGH (ref 70–99)
Glucose, Bld: 122 mg/dL — ABNORMAL HIGH (ref 70–99)
HCT: 29 % — ABNORMAL LOW (ref 39.0–52.0)
HEMATOCRIT: 23 % — AB (ref 39.0–52.0)
HEMOGLOBIN: 9.9 g/dL — AB (ref 13.0–17.0)
HEMOGLOBIN: 7.8 g/dL — AB (ref 13.0–17.0)
POTASSIUM: 3.7 mmol/L (ref 3.5–5.1)
Potassium: 3.9 mmol/L (ref 3.5–5.1)
SODIUM: 140 mmol/L (ref 135–145)
Sodium: 141 mmol/L (ref 135–145)

## 2014-10-05 LAB — MAGNESIUM: Magnesium: 1.6 mg/dL (ref 1.5–2.5)

## 2014-10-05 LAB — APTT: aPTT: 34 seconds (ref 24–37)

## 2014-10-05 LAB — CARBOXYHEMOGLOBIN
CARBOXYHEMOGLOBIN: 1.8 % — AB (ref 0.5–1.5)
Methemoglobin: 0.8 % (ref 0.0–1.5)
O2 Saturation: 58.6 %
Total hemoglobin: 7.2 g/dL — ABNORMAL LOW (ref 13.5–18.0)

## 2014-10-05 LAB — FIBRINOGEN: Fibrinogen: 432 mg/dL (ref 204–475)

## 2014-10-05 LAB — CREATININE, SERUM
CREATININE: 0.7 mg/dL (ref 0.50–1.35)
GFR calc Af Amer: >90 mL/min (ref >90)

## 2014-10-05 LAB — DIC (DISSEMINATED INTRAVASCULAR COAGULATION) PANEL
FIBRINOGEN: 313 mg/dL (ref 204–475)
PROTHROMBIN TIME: 23 s — AB (ref 11.6–15.2)
Smear Review: NONE SEEN

## 2014-10-05 LAB — DIC (DISSEMINATED INTRAVASCULAR COAGULATION)PANEL
D-Dimer, Quant: 7.12 ug/mL-FEU — ABNORMAL HIGH (ref 0.00–0.48)
INR: 2.02 — ABNORMAL HIGH (ref 0.00–1.49)
Platelets: 330 10*3/uL (ref 150–400)
aPTT: 38 seconds — ABNORMAL HIGH (ref 24–37)

## 2014-10-05 LAB — LACTATE DEHYDROGENASE: LDH: 371 U/L — ABNORMAL HIGH (ref 94–250)

## 2014-10-05 SURGERY — REDO STERNOTOMY
Anesthesia: General | Site: Chest

## 2014-10-05 MED ORDER — TRAMADOL HCL 50 MG PO TABS
50.0000 mg | ORAL_TABLET | ORAL | Status: DC | PRN
  Administered 2014-10-12 – 2014-10-15 (×2): 50 mg via ORAL
  Administered 2014-10-18: 100 mg via ORAL
  Administered 2014-10-19 – 2014-10-20 (×2): 50 mg via ORAL
  Filled 2014-10-05 (×3): qty 1
  Filled 2014-10-05: qty 2
  Filled 2014-10-05: qty 1

## 2014-10-05 MED ORDER — DOPAMINE-DEXTROSE 3.2-5 MG/ML-% IV SOLN
3.0000 ug/kg/min | INTRAVENOUS | Status: DC
  Administered 2014-10-05: 3 ug/kg/min via INTRAVENOUS

## 2014-10-05 MED ORDER — SODIUM CHLORIDE 0.9 % IV SOLN: 250.0000 mL | INTRAVENOUS | Status: DC

## 2014-10-05 MED ORDER — MORPHINE SULFATE 2 MG/ML IJ SOLN
1.0000 mg | INTRAMUSCULAR | Status: AC | PRN
  Administered 2014-10-06: 4 mg via INTRAVENOUS
  Filled 2014-10-05 (×2): qty 2

## 2014-10-05 MED ORDER — ACETAMINOPHEN 160 MG/5ML PO SOLN
1000.0000 mg | Freq: Four times a day (QID) | ORAL | Status: AC
  Administered 2014-10-05 – 2014-10-10 (×13): 1000 mg
  Filled 2014-10-05 (×11): qty 40.6

## 2014-10-05 MED ORDER — FENTANYL CITRATE 0.05 MG/ML IJ SOLN
INTRAMUSCULAR | Status: AC
  Filled 2014-10-05: qty 5

## 2014-10-05 MED ORDER — PROPOFOL 10 MG/ML IV BOLUS
INTRAVENOUS | Status: AC
  Filled 2014-10-05: qty 20

## 2014-10-05 MED ORDER — LACTATED RINGERS IV SOLN: INTRAVENOUS | Status: DC

## 2014-10-05 MED ORDER — PANTOPRAZOLE SODIUM 40 MG PO TBEC
40.0000 mg | DELAYED_RELEASE_TABLET | Freq: Every day | ORAL | Status: DC
  Filled 2014-10-05: qty 1

## 2014-10-05 MED ORDER — ACETAMINOPHEN 160 MG/5ML PO SOLN: 1000.0000 mg | Freq: Four times a day (QID) | ORAL | Status: DC

## 2014-10-05 MED ORDER — DOCUSATE SODIUM 100 MG PO CAPS: 200.0000 mg | ORAL_CAPSULE | Freq: Every day | ORAL | Status: DC

## 2014-10-05 MED ORDER — MIDAZOLAM HCL 10 MG/2ML IJ SOLN
INTRAMUSCULAR | Status: AC
  Filled 2014-10-05: qty 2

## 2014-10-05 MED ORDER — FLUCONAZOLE IN SODIUM CHLORIDE 400-0.9 MG/200ML-% IV SOLN: 400.0000 mg | Freq: Once | INTRAVENOUS | Status: DC

## 2014-10-05 MED ORDER — ACETAMINOPHEN 160 MG/5ML PO SOLN: 650.0000 mg | Freq: Once | ORAL | Status: AC

## 2014-10-05 MED ORDER — VANCOMYCIN HCL IN DEXTROSE 1-5 GM/200ML-% IV SOLN
1000.0000 mg | Freq: Three times a day (TID) | INTRAVENOUS | Status: DC
  Administered 2014-10-05: 1000 mg via INTRAVENOUS
  Filled 2014-10-05 (×4): qty 200

## 2014-10-05 MED ORDER — SODIUM CHLORIDE 0.9 % IR SOLN
Status: DC | PRN
  Administered 2014-10-05: 1000 mL

## 2014-10-05 MED ORDER — TRAMADOL HCL 50 MG PO TABS: 50.0000 mg | ORAL_TABLET | ORAL | Status: DC | PRN

## 2014-10-05 MED ORDER — ASPIRIN 81 MG PO CHEW: 324.0000 mg | CHEWABLE_TABLET | Freq: Every day | ORAL | Status: DC

## 2014-10-05 MED ORDER — MILRINONE IN DEXTROSE 20 MG/100ML IV SOLN
0.1250 ug/kg/min | INTRAVENOUS | Status: DC
  Administered 2014-10-06 – 2014-10-10 (×7): 0.25 ug/kg/min via INTRAVENOUS
  Administered 2014-10-12: 0.125 ug/kg/min via INTRAVENOUS
  Filled 2014-10-05 (×10): qty 100

## 2014-10-05 MED ORDER — INSULIN REGULAR BOLUS VIA INFUSION
0.0000 [IU] | Freq: Three times a day (TID) | INTRAVENOUS | Status: DC
  Filled 2014-10-05: qty 10

## 2014-10-05 MED ORDER — ONDANSETRON HCL 4 MG/2ML IJ SOLN: 4.0000 mg | Freq: Four times a day (QID) | INTRAMUSCULAR | Status: DC | PRN

## 2014-10-05 MED ORDER — EPHEDRINE SULFATE 50 MG/ML IJ SOLN
INTRAMUSCULAR | Status: AC
  Filled 2014-10-05: qty 1

## 2014-10-05 MED ORDER — SODIUM CHLORIDE 0.9 % IV SOLN
INTRAVENOUS | Status: DC
  Administered 2014-10-05: 01:00:00 via INTRAVENOUS

## 2014-10-05 MED ORDER — LACTATED RINGERS IV SOLN
INTRAVENOUS | Status: DC | PRN
  Administered 2014-10-05 (×2): via INTRAVENOUS

## 2014-10-05 MED ORDER — CEFUROXIME SODIUM 1.5 G IJ SOLR
1.5000 g | Freq: Two times a day (BID) | INTRAMUSCULAR | Status: DC
  Administered 2014-10-05: 1.5 g via INTRAVENOUS
  Filled 2014-10-05: qty 1.5

## 2014-10-05 MED ORDER — SODIUM CHLORIDE 0.9 % IV SOLN: Freq: Once | INTRAVENOUS | Status: AC

## 2014-10-05 MED ORDER — LACTATED RINGERS IV SOLN
INTRAVENOUS | Status: DC
  Administered 2014-10-06: 06:00:00 via INTRAVENOUS

## 2014-10-05 MED ORDER — LIDOCAINE HCL (CARDIAC) 20 MG/ML IV SOLN
INTRAVENOUS | Status: AC
Start: 2014-10-05 — End: 2014-10-05
  Filled 2014-10-05: qty 5

## 2014-10-05 MED ORDER — FENTANYL CITRATE 0.05 MG/ML IJ SOLN
INTRAMUSCULAR | Status: DC | PRN
  Administered 2014-10-05 (×2): 150 ug via INTRAVENOUS

## 2014-10-05 MED ORDER — SODIUM CHLORIDE 0.9 % IV SOLN
Freq: Once | INTRAVENOUS | Status: AC
  Administered 2014-10-05: 22:00:00 via INTRAVENOUS

## 2014-10-05 MED ORDER — DEXMEDETOMIDINE HCL IN NACL 200 MCG/50ML IV SOLN
INTRAVENOUS | Status: AC
  Filled 2014-10-05: qty 50

## 2014-10-05 MED ORDER — ACETAMINOPHEN 650 MG RE SUPP: 650.0000 mg | Freq: Once | RECTAL | Status: AC

## 2014-10-05 MED ORDER — SODIUM CHLORIDE 0.9 % IV SOLN: INTRAVENOUS | Status: DC

## 2014-10-05 MED ORDER — MORPHINE SULFATE 2 MG/ML IJ SOLN: 2.0000 mg | INTRAMUSCULAR | Status: DC | PRN

## 2014-10-05 MED ORDER — POTASSIUM CHLORIDE 2 MEQ/ML IV SOLN
80.0000 meq | INTRAVENOUS | Status: DC
  Filled 2014-10-05 (×2): qty 40

## 2014-10-05 MED ORDER — ACETAMINOPHEN 160 MG/5ML PO SOLN
650.0000 mg | Freq: Once | ORAL | Status: DC
  Filled 2014-10-05: qty 20.3

## 2014-10-05 MED ORDER — SODIUM CHLORIDE 0.9 % IV SOLN
INTRAVENOUS | Status: AC
  Administered 2014-10-05: 4 [IU]/h via INTRAVENOUS
  Filled 2014-10-05: qty 2.5

## 2014-10-05 MED ORDER — VECURONIUM BROMIDE 10 MG IV SOLR
INTRAVENOUS | Status: DC | PRN
  Administered 2014-10-05: 4 mg via INTRAVENOUS

## 2014-10-05 MED ORDER — VANCOMYCIN HCL 10 G IV SOLR
1500.0000 mg | INTRAVENOUS | Status: DC
  Filled 2014-10-05 (×2): qty 1500

## 2014-10-05 MED ORDER — SODIUM CHLORIDE 0.9 % IJ SOLN
OROMUCOSAL | Status: DC | PRN
  Administered 2014-10-05: 4 mL via TOPICAL

## 2014-10-05 MED ORDER — MIDAZOLAM HCL 5 MG/5ML IJ SOLN
INTRAMUSCULAR | Status: DC | PRN
  Administered 2014-10-05: 2 mg via INTRAVENOUS
  Administered 2014-10-05: 4 mg via INTRAVENOUS

## 2014-10-05 MED ORDER — SODIUM CHLORIDE 0.9 % IV SOLN
INTRAVENOUS | Status: DC | PRN
  Administered 2014-10-05 (×2): via INTRAVENOUS

## 2014-10-05 MED ORDER — HEMOSTATIC AGENTS (NO CHARGE) OPTIME
TOPICAL | Status: DC | PRN
  Administered 2014-10-05: 1 via TOPICAL

## 2014-10-05 MED ORDER — BISACODYL 5 MG PO TBEC: 10.0000 mg | DELAYED_RELEASE_TABLET | Freq: Every day | ORAL | Status: DC

## 2014-10-05 MED ORDER — SODIUM CHLORIDE 0.9 % IJ SOLN
3.0000 mL | Freq: Two times a day (BID) | INTRAMUSCULAR | Status: DC
  Administered 2014-10-06: 3 mL via INTRAVENOUS

## 2014-10-05 MED ORDER — SODIUM CHLORIDE 0.45 % IV SOLN: INTRAVENOUS | Status: DC | PRN

## 2014-10-05 MED ORDER — LACTATED RINGERS IV SOLN: 500.0000 mL | Freq: Once | INTRAVENOUS | Status: DC | PRN

## 2014-10-05 MED ORDER — DEXTROSE 5 % IV SOLN
1.5000 g | Freq: Two times a day (BID) | INTRAVENOUS | Status: DC
  Filled 2014-10-05 (×2): qty 1.5

## 2014-10-05 MED ORDER — PHENYLEPHRINE HCL 10 MG/ML IJ SOLN
INTRAMUSCULAR | Status: DC | PRN
  Administered 2014-10-05: 120 ug via INTRAVENOUS

## 2014-10-05 MED ORDER — LIDOCAINE HCL (CARDIAC) 20 MG/ML IV SOLN
INTRAVENOUS | Status: DC | PRN
  Administered 2014-10-05: 50 mg via INTRAVENOUS

## 2014-10-05 MED ORDER — DEXTROSE 5 % IV SOLN
750.0000 mg | INTRAVENOUS | Status: DC
  Filled 2014-10-05 (×2): qty 750

## 2014-10-05 MED ORDER — ETOMIDATE 2 MG/ML IV SOLN
INTRAVENOUS | Status: DC | PRN
  Administered 2014-10-05: 10 mg via INTRAVENOUS

## 2014-10-05 MED ORDER — CHLORHEXIDINE GLUCONATE CLOTH 2 % EX PADS: 6.0000 | MEDICATED_PAD | Freq: Once | CUTANEOUS | Status: DC

## 2014-10-05 MED ORDER — ASPIRIN EC 81 MG PO TBEC
81.0000 mg | DELAYED_RELEASE_TABLET | Freq: Every day | ORAL | Status: DC
Start: 2014-10-05 — End: 2014-10-05
  Filled 2014-10-05: qty 1

## 2014-10-05 MED ORDER — SODIUM CHLORIDE 0.9 % IV SOLN: Freq: Once | INTRAVENOUS | Status: DC

## 2014-10-05 MED ORDER — PLASMA-LYTE 148 IV SOLN
INTRAVENOUS | Status: AC
  Administered 2014-10-05: 500 mL
  Filled 2014-10-05: qty 2.5

## 2014-10-05 MED ORDER — EPINEPHRINE HCL 1 MG/ML IJ SOLN
2.0000 ug/min | INTRAMUSCULAR | Status: DC
  Filled 2014-10-05: qty 4

## 2014-10-05 MED ORDER — DEXMEDETOMIDINE HCL IN NACL 400 MCG/100ML IV SOLN
0.1000 ug/kg/h | INTRAVENOUS | Status: DC
  Administered 2014-10-05 – 2014-10-06 (×5): 0.7 ug/kg/h via INTRAVENOUS
  Administered 2014-10-06: 08:00:00 via INTRAVENOUS
  Administered 2014-10-06 – 2014-10-08 (×9): 0.7 ug/kg/h via INTRAVENOUS
  Administered 2014-10-09: 0.2 ug/kg/h via INTRAVENOUS
  Administered 2014-10-09 (×2): 0.7 ug/kg/h via INTRAVENOUS
  Administered 2014-10-10: 0.2 ug/kg/h via INTRAVENOUS
  Filled 2014-10-05 (×5): qty 100
  Filled 2014-10-05 (×3): qty 50
  Filled 2014-10-05: qty 100
  Filled 2014-10-05: qty 50
  Filled 2014-10-05: qty 100
  Filled 2014-10-05 (×3): qty 50
  Filled 2014-10-05 (×3): qty 100
  Filled 2014-10-05: qty 50

## 2014-10-05 MED ORDER — VANCOMYCIN HCL 1000 MG IV SOLR
INTRAVENOUS | Status: DC | PRN
  Administered 2014-10-05: 1000 mL

## 2014-10-05 MED ORDER — NOREPINEPHRINE BITARTRATE 1 MG/ML IV SOLN
0.0000 ug/min | INTRAVENOUS | Status: DC
  Administered 2014-10-05: 17 ug/min via INTRAVENOUS
  Filled 2014-10-05: qty 4

## 2014-10-05 MED ORDER — COAGULATION FACTOR VIIA RECOMB 1 MG IV SOLR
2.0000 mg | Freq: Once | INTRAVENOUS | Status: AC
  Administered 2014-10-05: 2 mg via INTRAVENOUS
  Filled 2014-10-05: qty 2

## 2014-10-05 MED ORDER — RIFAMPIN 300 MG PO CAPS: 600.0000 mg | ORAL_CAPSULE | Freq: Once | ORAL | Status: DC

## 2014-10-05 MED ORDER — BISACODYL 10 MG RE SUPP: 10.0000 mg | Freq: Every day | RECTAL | Status: DC

## 2014-10-05 MED ORDER — ASPIRIN 300 MG RE SUPP: 300.0000 mg | Freq: Every day | RECTAL | Status: DC

## 2014-10-05 MED ORDER — SUCCINYLCHOLINE CHLORIDE 20 MG/ML IJ SOLN
INTRAMUSCULAR | Status: DC | PRN
  Administered 2014-10-05: 100 mg via INTRAVENOUS

## 2014-10-05 MED ORDER — INSULIN REGULAR HUMAN 100 UNIT/ML IJ SOLN: INTRAMUSCULAR | Status: DC

## 2014-10-05 MED ORDER — ROCURONIUM BROMIDE 50 MG/5ML IV SOLN
INTRAVENOUS | Status: AC
  Filled 2014-10-05: qty 1

## 2014-10-05 MED ORDER — EPINEPHRINE HCL 1 MG/ML IJ SOLN
0.0000 ug/min | INTRAMUSCULAR | Status: AC
  Administered 2014-10-05: 2 ug/min via INTRAVENOUS
  Filled 2014-10-05: qty 4

## 2014-10-05 MED ORDER — SODIUM CHLORIDE 0.9 % IJ SOLN: 3.0000 mL | Freq: Two times a day (BID) | INTRAMUSCULAR | Status: DC

## 2014-10-05 MED ORDER — SODIUM CHLORIDE 0.9 % IJ SOLN
INTRAMUSCULAR | Status: AC
  Filled 2014-10-05: qty 30

## 2014-10-05 MED ORDER — VANCOMYCIN HCL IN DEXTROSE 1-5 GM/200ML-% IV SOLN
1000.0000 mg | Freq: Two times a day (BID) | INTRAVENOUS | Status: DC
  Administered 2014-10-05: 1000 mg via INTRAVENOUS
  Filled 2014-10-05: qty 200

## 2014-10-05 MED ORDER — FLUCONAZOLE IN SODIUM CHLORIDE 400-0.9 MG/200ML-% IV SOLN
400.0000 mg | Freq: Once | INTRAVENOUS | Status: DC
  Filled 2014-10-05: qty 200

## 2014-10-05 MED ORDER — TRAZODONE HCL 50 MG PO TABS
50.0000 mg | ORAL_TABLET | Freq: Every day | ORAL | Status: DC
  Administered 2014-10-06 – 2014-10-07 (×2): 50 mg via ORAL
  Filled 2014-10-05 (×3): qty 1

## 2014-10-05 MED ORDER — FAMOTIDINE IN NACL 20-0.9 MG/50ML-% IV SOLN
20.0000 mg | Freq: Two times a day (BID) | INTRAVENOUS | Status: AC
  Administered 2014-10-05 (×2): 20 mg via INTRAVENOUS
  Filled 2014-10-05: qty 50

## 2014-10-05 MED ORDER — FAMOTIDINE IN NACL 20-0.9 MG/50ML-% IV SOLN: 20.0000 mg | Freq: Two times a day (BID) | INTRAVENOUS | Status: DC

## 2014-10-05 MED ORDER — SODIUM CHLORIDE 0.9 % IV SOLN
INTRAVENOUS | Status: DC
  Filled 2014-10-05 (×2): qty 30

## 2014-10-05 MED ORDER — PANTOPRAZOLE SODIUM 40 MG PO TBEC: 40.0000 mg | DELAYED_RELEASE_TABLET | Freq: Every day | ORAL | Status: DC

## 2014-10-05 MED ORDER — PHENYLEPHRINE HCL 10 MG/ML IJ SOLN
30.0000 ug/min | INTRAVENOUS | Status: AC
  Administered 2014-10-05: 25 ug/min via INTRAVENOUS
  Filled 2014-10-05 (×2): qty 2

## 2014-10-05 MED ORDER — OXYCODONE HCL 5 MG PO TABS: 5.0000 mg | ORAL_TABLET | ORAL | Status: DC | PRN

## 2014-10-05 MED ORDER — ALBUMIN HUMAN 5 % IV SOLN
INTRAVENOUS | Status: DC | PRN
  Administered 2014-10-05: 14:00:00 via INTRAVENOUS

## 2014-10-05 MED ORDER — VANCOMYCIN HCL 1000 MG IV SOLR
INTRAVENOUS | Status: DC
  Filled 2014-10-05: qty 1000

## 2014-10-05 MED ORDER — NOREPINEPHRINE BITARTRATE 1 MG/ML IV SOLN
0.0000 ug/min | INTRAVENOUS | Status: DC
  Administered 2014-10-05: 2 ug/min via INTRAVENOUS
  Filled 2014-10-05 (×3): qty 4

## 2014-10-05 MED ORDER — LIDOCAINE HCL (PF) 1 % IJ SOLN
INTRAMUSCULAR | Status: AC
  Administered 2014-10-05: 10 mL
  Filled 2014-10-05: qty 10

## 2014-10-05 MED ORDER — MIDAZOLAM HCL 2 MG/2ML IJ SOLN: 2.0000 mg | INTRAMUSCULAR | Status: DC | PRN

## 2014-10-05 MED ORDER — ASPIRIN EC 325 MG PO TBEC: 325.0000 mg | DELAYED_RELEASE_TABLET | Freq: Every day | ORAL | Status: DC

## 2014-10-05 MED ORDER — METOPROLOL TARTRATE 12.5 MG HALF TABLET
12.5000 mg | ORAL_TABLET | Freq: Once | ORAL | Status: DC
  Filled 2014-10-05: qty 1

## 2014-10-05 MED ORDER — TEMAZEPAM 15 MG PO CAPS: 15.0000 mg | ORAL_CAPSULE | Freq: Once | ORAL | Status: DC | PRN

## 2014-10-05 MED ORDER — CALCIUM CHLORIDE 10 % IV SOLN
1.0000 g | Freq: Once | INTRAVENOUS | Status: AC
  Administered 2014-10-05: 1 g via INTRAVENOUS

## 2014-10-05 MED ORDER — INSULIN REGULAR BOLUS VIA INFUSION: 0.0000 [IU] | Freq: Three times a day (TID) | INTRAVENOUS | Status: DC

## 2014-10-05 MED ORDER — SODIUM CHLORIDE 0.9 % IV SOLN
INTRAVENOUS | Status: AC
  Administered 2014-10-05: 69.8 mL/h via INTRAVENOUS
  Filled 2014-10-05: qty 40

## 2014-10-05 MED ORDER — SODIUM CHLORIDE 0.9 % IV SOLN
INTRAVENOUS | Status: DC
  Administered 2014-10-06: 0.5 [IU]/h via INTRAVENOUS
  Filled 2014-10-05 (×2): qty 2.5

## 2014-10-05 MED ORDER — NITROGLYCERIN IN D5W 200-5 MCG/ML-% IV SOLN
2.0000 ug/min | INTRAVENOUS | Status: DC
  Filled 2014-10-05 (×2): qty 250

## 2014-10-05 MED ORDER — HEPARIN SODIUM (PORCINE) 1000 UNIT/ML IJ SOLN
INTRAMUSCULAR | Status: AC
  Filled 2014-10-05: qty 1

## 2014-10-05 MED ORDER — PROTAMINE SULFATE 10 MG/ML IV SOLN
INTRAVENOUS | Status: AC
  Filled 2014-10-05: qty 10

## 2014-10-05 MED ORDER — MORPHINE SULFATE 2 MG/ML IJ SOLN: 1.0000 mg | INTRAMUSCULAR | Status: DC | PRN

## 2014-10-05 MED ORDER — DOPAMINE-DEXTROSE 3.2-5 MG/ML-% IV SOLN
0.0000 ug/kg/min | INTRAVENOUS | Status: DC
  Filled 2014-10-05 (×2): qty 250

## 2014-10-05 MED ORDER — LACTATED RINGERS IV SOLN: 500.0000 mL | Freq: Once | INTRAVENOUS | Status: AC | PRN

## 2014-10-05 MED ORDER — DM-GUAIFENESIN ER 30-600 MG PO TB12: 1.0000 | ORAL_TABLET | Freq: Two times a day (BID) | ORAL | Status: DC

## 2014-10-05 MED ORDER — BUDESONIDE-FORMOTEROL FUMARATE 160-4.5 MCG/ACT IN AERO
2.0000 | INHALATION_SPRAY | Freq: Two times a day (BID) | RESPIRATORY_TRACT | Status: DC
  Administered 2014-10-10 – 2014-11-02 (×45): 2 via RESPIRATORY_TRACT
  Filled 2014-10-05 (×3): qty 6

## 2014-10-05 MED ORDER — PHENYLEPHRINE HCL 10 MG/ML IJ SOLN
0.0000 ug/min | INTRAMUSCULAR | Status: DC
  Filled 2014-10-05: qty 2

## 2014-10-05 MED ORDER — PHENYLEPHRINE 40 MCG/ML (10ML) SYRINGE FOR IV PUSH (FOR BLOOD PRESSURE SUPPORT)
PREFILLED_SYRINGE | INTRAVENOUS | Status: AC
  Filled 2014-10-05: qty 10

## 2014-10-05 MED ORDER — MILRINONE IN DEXTROSE 20 MG/100ML IV SOLN
0.1250 ug/kg/min | INTRAVENOUS | Status: AC
  Administered 2014-10-05: 0.25 ug/kg/min via INTRAVENOUS
  Filled 2014-10-05: qty 100

## 2014-10-05 MED ORDER — VECURONIUM BROMIDE 10 MG IV SOLR
INTRAVENOUS | Status: AC
  Filled 2014-10-05: qty 20

## 2014-10-05 MED ORDER — DEXMEDETOMIDINE HCL IN NACL 200 MCG/50ML IV SOLN: 0.1000 ug/kg/h | INTRAVENOUS | Status: DC

## 2014-10-05 MED ORDER — SODIUM CHLORIDE 0.9 % IV SOLN
Freq: Once | INTRAVENOUS | Status: DC
Start: 2014-10-05 — End: 2014-10-07

## 2014-10-05 MED ORDER — NITROGLYCERIN IN D5W 200-5 MCG/ML-% IV SOLN
0.0000 ug/min | INTRAVENOUS | Status: DC
Start: 2014-10-05 — End: 2014-10-05

## 2014-10-05 MED ORDER — LACTATED RINGERS IV SOLN
INTRAVENOUS | Status: DC
  Administered 2014-10-07: 05:00:00 via INTRAVENOUS

## 2014-10-05 MED ORDER — SODIUM CHLORIDE 0.9 % IJ SOLN: 3.0000 mL | INTRAMUSCULAR | Status: DC | PRN

## 2014-10-05 MED ORDER — MAGNESIUM SULFATE 4 GM/100ML IV SOLN: 4.0000 g | Freq: Once | INTRAVENOUS | Status: DC

## 2014-10-05 MED ORDER — FENTANYL CITRATE 0.05 MG/ML IJ SOLN
INTRAMUSCULAR | Status: AC
Start: 2014-10-05 — End: 2014-10-05
  Filled 2014-10-05: qty 5

## 2014-10-05 MED ORDER — RIFAMPIN 300 MG PO CAPS
600.0000 mg | ORAL_CAPSULE | Freq: Once | ORAL | Status: DC
  Filled 2014-10-05: qty 2

## 2014-10-05 MED ORDER — POTASSIUM CHLORIDE 10 MEQ/50ML IV SOLN: 10.0000 meq | INTRAVENOUS | Status: DC

## 2014-10-05 MED ORDER — ONDANSETRON HCL 4 MG/2ML IJ SOLN
4.0000 mg | Freq: Four times a day (QID) | INTRAMUSCULAR | Status: DC | PRN
  Administered 2014-10-11: 4 mg via INTRAVENOUS
  Filled 2014-10-05: qty 2

## 2014-10-05 MED ORDER — DEXMEDETOMIDINE HCL IN NACL 200 MCG/50ML IV SOLN
0.1000 ug/kg/h | INTRAVENOUS | Status: DC
  Filled 2014-10-05: qty 50

## 2014-10-05 MED ORDER — MILRINONE IN DEXTROSE 20 MG/100ML IV SOLN: 0.2500 ug/kg/min | INTRAVENOUS | Status: DC

## 2014-10-05 MED ORDER — ALBUMIN HUMAN 5 % IV SOLN
250.0000 mL | INTRAVENOUS | Status: AC | PRN
  Administered 2014-10-05 (×3): 250 mL via INTRAVENOUS
  Filled 2014-10-05 (×4): qty 250

## 2014-10-05 MED ORDER — ROCURONIUM BROMIDE 100 MG/10ML IV SOLN
INTRAVENOUS | Status: DC | PRN
  Administered 2014-10-05: 50 mg via INTRAVENOUS

## 2014-10-05 MED ORDER — ACETAMINOPHEN 500 MG PO TABS
1000.0000 mg | ORAL_TABLET | Freq: Four times a day (QID) | ORAL | Status: AC
  Administered 2014-10-07 (×3): 1000 mg via ORAL
  Filled 2014-10-05 (×18): qty 2

## 2014-10-05 MED ORDER — MIDAZOLAM HCL 2 MG/2ML IJ SOLN
2.0000 mg | INTRAMUSCULAR | Status: DC | PRN
  Administered 2014-10-05 – 2014-10-08 (×28): 2 mg via INTRAVENOUS
  Filled 2014-10-05 (×28): qty 2

## 2014-10-05 MED ORDER — BISACODYL 5 MG PO TBEC: 5.0000 mg | DELAYED_RELEASE_TABLET | Freq: Once | ORAL | Status: DC

## 2014-10-05 MED ORDER — ACETAMINOPHEN 650 MG RE SUPP: 650.0000 mg | Freq: Once | RECTAL | Status: DC

## 2014-10-05 MED ORDER — EPINEPHRINE HCL 1 MG/ML IJ SOLN
4.0000 ug/min | INTRAVENOUS | Status: DC
  Administered 2014-10-05: 3 ug/min via INTRAVENOUS
  Administered 2014-10-06 – 2014-10-08 (×4): 5 ug/min via INTRAVENOUS
  Filled 2014-10-05 (×8): qty 4

## 2014-10-05 MED ORDER — DEXMEDETOMIDINE HCL IN NACL 400 MCG/100ML IV SOLN
0.1000 ug/kg/h | INTRAVENOUS | Status: AC
  Administered 2014-10-05: 0.4 ug/kg/h via INTRAVENOUS
  Filled 2014-10-05: qty 100

## 2014-10-05 MED ORDER — DEXTROSE 5 % IV SOLN
1.5000 g | INTRAVENOUS | Status: AC
  Administered 2014-10-05: 1.5 g via INTRAVENOUS
  Filled 2014-10-05: qty 1.5

## 2014-10-05 MED ORDER — PANTOPRAZOLE SODIUM 40 MG PO TBEC
40.0000 mg | DELAYED_RELEASE_TABLET | Freq: Every day | ORAL | Status: DC
Start: 2014-10-06 — End: 2014-10-06

## 2014-10-05 MED ORDER — MAGNESIUM SULFATE 50 % IJ SOLN
40.0000 meq | INTRAMUSCULAR | Status: DC
  Filled 2014-10-05 (×2): qty 10

## 2014-10-05 MED ORDER — SODIUM CHLORIDE 0.45 % IV SOLN
INTRAVENOUS | Status: DC | PRN
  Administered 2014-10-05: 20 mL/h via INTRAVENOUS
  Administered 2014-10-06 – 2014-10-08 (×2): via INTRAVENOUS
  Administered 2014-10-09: 20 mL via INTRAVENOUS

## 2014-10-05 MED ORDER — POTASSIUM CHLORIDE 10 MEQ/50ML IV SOLN
10.0000 meq | INTRAVENOUS | Status: AC
  Administered 2014-10-05 (×3): 10 meq via INTRAVENOUS

## 2014-10-05 MED ORDER — LIDOCAINE HCL (PF) 1 % IJ SOLN
INTRAMUSCULAR | Status: AC
  Administered 2014-10-05: 5 mL
  Filled 2014-10-05: qty 5

## 2014-10-05 MED ORDER — ACETAMINOPHEN 500 MG PO TABS: 1000.0000 mg | ORAL_TABLET | Freq: Four times a day (QID) | ORAL | Status: DC

## 2014-10-05 SURGICAL SUPPLY — 102 items
ADAPTER CARDIO PERF ANTE/RETRO (ADAPTER) IMPLANT
APPLIER CLIP 9.375 MED OPEN (MISCELLANEOUS)
APPLIER CLIP 9.375 SM OPEN (CLIP)
ATTRACTOMAT 16X20 MAGNETIC DRP (DRAPES) ×5 IMPLANT
BAG DECANTER FOR FLEXI CONT (MISCELLANEOUS) ×5 IMPLANT
BLADE CORE FAN STRYKER (BLADE) ×5 IMPLANT
BLADE SURG 10 STRL SS (BLADE) ×5 IMPLANT
BLADE SURG 11 STRL SS (BLADE) IMPLANT
BLADE SURG ROTATE 9660 (MISCELLANEOUS) IMPLANT
CANISTER SUCTION 2500CC (MISCELLANEOUS) ×5 IMPLANT
CANNULA GUNDRY RCSP 15FR (MISCELLANEOUS) IMPLANT
CATH THORACIC 28FR (CATHETERS) IMPLANT
CATH THORACIC 28FR RT ANG (CATHETERS) ×5 IMPLANT
CATH THORACIC 36FR RT ANG (CATHETERS) ×10 IMPLANT
CLIP APPLIE 9.375 MED OPEN (MISCELLANEOUS) IMPLANT
CLIP APPLIE 9.375 SM OPEN (CLIP) IMPLANT
CLIP FOGARTY SPRING 6M (CLIP) IMPLANT
CLIP TI MEDIUM 24 (CLIP) IMPLANT
CLIP TI WIDE RED SMALL 24 (CLIP) IMPLANT
CONN 1/2X1/2X1/2  BEN (MISCELLANEOUS) ×2
CONN 1/2X1/2X1/2 BEN (MISCELLANEOUS) ×3 IMPLANT
CONN ST 1/4X3/8  BEN (MISCELLANEOUS) ×4
CONN ST 1/4X3/8 BEN (MISCELLANEOUS) ×6 IMPLANT
CONN Y 3/8X3/8X3/8  BEN (MISCELLANEOUS) ×2
CONN Y 3/8X3/8X3/8 BEN (MISCELLANEOUS) ×3 IMPLANT
COVER SURGICAL LIGHT HANDLE (MISCELLANEOUS) ×10 IMPLANT
CRADLE DONUT ADULT HEAD (MISCELLANEOUS) ×5 IMPLANT
DRAPE CARDIOVASCULAR INCISE (DRAPES) ×2
DRAPE INCISE IOBAN 66X45 STRL (DRAPES) ×5 IMPLANT
DRAPE SRG 135X102X78XABS (DRAPES) ×3 IMPLANT
DRSG AQUACEL AG ADV 3.5X14 (GAUZE/BANDAGES/DRESSINGS) ×5 IMPLANT
ELECT CAUTERY BLADE 6.4 (BLADE) IMPLANT
ELECT REM PT RETURN 9FT ADLT (ELECTROSURGICAL) ×10
ELECTRODE REM PT RTRN 9FT ADLT (ELECTROSURGICAL) ×6 IMPLANT
GAUZE SPONGE 4X4 12PLY STRL (GAUZE/BANDAGES/DRESSINGS) ×10 IMPLANT
GLOVE BIO SURGEON STRL SZ 6 (GLOVE) IMPLANT
GLOVE BIO SURGEON STRL SZ 6.5 (GLOVE) IMPLANT
GLOVE BIO SURGEON STRL SZ7 (GLOVE) IMPLANT
GLOVE BIO SURGEON STRL SZ7.5 (GLOVE) IMPLANT
GLOVE BIO SURGEONS STRL SZ 6.5 (GLOVE)
GLOVE SKINSENSE NS SZ6.5 (GLOVE)
GLOVE SKINSENSE STRL SZ6.5 (GLOVE) IMPLANT
GOWN STRL REUS W/ TWL LRG LVL3 (GOWN DISPOSABLE) ×36 IMPLANT
GOWN STRL REUS W/TWL LRG LVL3 (GOWN DISPOSABLE) ×24
HEMOSTAT POWDER SURGIFOAM 1G (HEMOSTASIS) IMPLANT
HEMOSTAT SURGICEL 2X14 (HEMOSTASIS) IMPLANT
INSERT FOGARTY XLG (MISCELLANEOUS) IMPLANT
KIT BASIN OR (CUSTOM PROCEDURE TRAY) ×5 IMPLANT
KIT ROOM TURNOVER OR (KITS) ×5 IMPLANT
KIT SUCTION CATH 14FR (SUCTIONS) ×5 IMPLANT
KIT VASOVIEW W/TROCAR VH 2000 (KITS) IMPLANT
LINE VENT (MISCELLANEOUS) IMPLANT
NEEDLE PERC 18GX7CM (NEEDLE) ×5 IMPLANT
NS IRRIG 1000ML POUR BTL (IV SOLUTION) ×25 IMPLANT
PACK OPEN HEART (CUSTOM PROCEDURE TRAY) ×5 IMPLANT
PAD ARMBOARD 7.5X6 YLW CONV (MISCELLANEOUS) ×10 IMPLANT
PAD ELECT DEFIB RADIOL ZOLL (MISCELLANEOUS) ×5 IMPLANT
SET CARDIOPLEGIA MPS 5001102 (MISCELLANEOUS) IMPLANT
SHEATH AVANTI 11CM 5FR (MISCELLANEOUS) ×5 IMPLANT
SILICONE THORACIC CATHETER ×5 IMPLANT
SOLUTION ANTI FOG 6CC (MISCELLANEOUS) IMPLANT
SPONGE GAUZE 4X4 12PLY STER LF (GAUZE/BANDAGES/DRESSINGS) ×5 IMPLANT
SPONGE LAP 18X18 X RAY DECT (DISPOSABLE) IMPLANT
SPONGE LAP 4X18 X RAY DECT (DISPOSABLE) IMPLANT
STAPLER VISISTAT 35W (STAPLE) ×10 IMPLANT
STOPCOCK 4 WAY LG BORE MALE ST (IV SETS) IMPLANT
SURGIFLO W/THROMBIN 8M KIT (HEMOSTASIS) ×5 IMPLANT
SUT ETHIBOND 2 0 SH (SUTURE)
SUT ETHIBOND 2 0 SH 36X2 (SUTURE) IMPLANT
SUT ETHILON 3 0 FSL (SUTURE) ×5 IMPLANT
SUT PROLENE 3 0 SH 1 (SUTURE) IMPLANT
SUT PROLENE 4 0 RB 1 (SUTURE)
SUT PROLENE 4-0 RB1 .5 CRCL 36 (SUTURE) IMPLANT
SUT PROLENE 5 0 C 1 36 (SUTURE) IMPLANT
SUT PROLENE 6 0 C 1 30 (SUTURE) IMPLANT
SUT PROLENE 7 0 BV 1 (SUTURE) IMPLANT
SUT PROLENE 7 0 BV1 MDA (SUTURE) IMPLANT
SUT PROLENE 8 0 BV175 6 (SUTURE) IMPLANT
SUT SILK  1 MH (SUTURE) ×10
SUT SILK 1 MH (SUTURE) ×15 IMPLANT
SUT STEEL 6MS V (SUTURE) ×10 IMPLANT
SUT STEEL STERNAL CCS#1 18IN (SUTURE) IMPLANT
SUT STEEL SZ 6 DBL 3X14 BALL (SUTURE) ×10 IMPLANT
SUT VIC AB 1 CTX 18 (SUTURE) ×5 IMPLANT
SUT VIC AB 1 CTX 36 (SUTURE) ×4
SUT VIC AB 1 CTX36XBRD ANBCTR (SUTURE) ×6 IMPLANT
SUT VIC AB 2-0 CT1 36 (SUTURE) IMPLANT
SUT VIC AB 2-0 CTX 27 (SUTURE) ×5 IMPLANT
SUT VIC AB 3-0 SH 27 (SUTURE)
SUT VIC AB 3-0 SH 27X BRD (SUTURE) IMPLANT
SUT VIC AB 3-0 X1 27 (SUTURE) ×10 IMPLANT
SUT VICRYL 4-0 PS2 18IN ABS (SUTURE) IMPLANT
SUTURE E-PAK OPEN HEART (SUTURE) ×5 IMPLANT
SYSTEM SAHARA CHEST DRAIN ATS (WOUND CARE) IMPLANT
TAPE CLOTH SURG 4X10 WHT LF (GAUZE/BANDAGES/DRESSINGS) ×5 IMPLANT
TOWEL OR 17X24 6PK STRL BLUE (TOWEL DISPOSABLE) ×5 IMPLANT
TOWEL OR 17X26 10 PK STRL BLUE (TOWEL DISPOSABLE) ×5 IMPLANT
TRAY CATH LUMEN 1 20CM STRL (SET/KITS/TRAYS/PACK) IMPLANT
TRAY FOLEY IC TEMP SENS 16FR (CATHETERS) ×5 IMPLANT
TUBING INSUFFLATION 10FT LAP (TUBING) IMPLANT
UNDERPAD 30X30 INCONTINENT (UNDERPADS AND DIAPERS) ×5 IMPLANT
WATER STERILE IRR 1000ML POUR (IV SOLUTION) ×10 IMPLANT

## 2014-10-05 NOTE — Progress Notes (Signed)
Pts MT dumped about 179mL within 10 mins and continuously trying to clot, PI dropped as low as high 1 and low 2, MAPs mid 60s, all other vitals WNL, continuously milking chest tube for about 45 minutes so it would not clot off, nurse titrated levophed up to help MAPs, MD Bartle and Heart failure NP at bedside, Nurse notified MD [name], orders obtained to give 1 unit of FFP, 2 units of RBCs, Novoseven, and keep maps greater than 70. Nurse will continue to monitor.

## 2014-10-05 NOTE — Progress Notes (Addendum)
Nursing note  Patient this AM Dizzy/lightheaded, diaphoretic, and pale in color, Pt Sinus tach 100 on monitor, pt sitting on side of bed, pt place in supine position,Patients RN in room with patient. MAP 40 VAD pager paged. PT driveline dressing, with old drainage, covering gauze. Blood sugar 148.0750 VAD team in room, orders received.250 ML ns bolus given as ordered. 0810 Emergency release blood started per MD order 2 RNs to verify. Prior to transfer pt MAP increased to 64. PT transported to 2S13 with VAD team, back up equipment on Batteries. Report given to 2S RN at bedside. Jeffey Janssen, Bettina Gavia RN

## 2014-10-05 NOTE — Anesthesia Postprocedure Evaluation (Signed)
  Anesthesia Post-op Note  Patient: Scott [name]  Procedure(s) Performed: Procedure(s): REDO STERNOTOMY (N/A) TRANSESOPHAGEAL ECHOCARDIOGRAM (TEE) (N/A) EVACUATION OF HEMATOMA  Patient Location: SICU  Anesthesia Type:General  Level of Consciousness: sedated and Patient remains intubated per anesthesia plan  Airway and Oxygen Therapy: Patient remains intubated per anesthesia plan and Patient placed on Ventilator (see vital sign flow sheet for setting)  Post-op Pain: none  Post-op Assessment: Post-op Vital signs reviewed, Patient's Cardiovascular Status Stable and Respiratory Function Stable  Post-op Vital Signs: stable  Last Vitals:  Filed Vitals:   10/05/14 1815  Pulse:   Temp: 36.8 C  Resp: 16    Complications: No apparent anesthesia complications

## 2014-10-05 NOTE — Significant Event (Signed)
Rapid Response Event Note Called by primary RN to assist with VAD at request of Dr. [name] Overview: Time Called: 0031 Arrival Time: 0033 Event Type: Cardiac  Initial Focused Assessment:  Pt is resting comfortably in bed on CPAP, RR 24.  Spoke with Dr. [name] and changed pump speed from 10,000 to 9600 with the new low backup 9200.  PI remained low at 1.6.  Dr. [name] aware & orders rcvd to start NS 75cc/hr. Interventions: Decrease pump speed to 9600 New low backup 9200 Start NS 75cc/hr  Event Summary: Name of Physician Notified: DR. [name] at [phone]  Name of Consulting Physician Notified: Dr. Nelda Marseille at 1450  Outcome: Stayed in room and stabalized  Event End Time: [name], Scott [name]

## 2014-10-05 NOTE — Progress Notes (Signed)
  Echocardiogram Echocardiogram Transesophageal has been performed.  Scott [name] 10/05/2014, 12:09 PM

## 2014-10-05 NOTE — Progress Notes (Signed)
ANTIBIOTIC CONSULT NOTE - FOLLOW UP ANTICOAGULATION NOTE - FOLLOW UP  Pharmacy Consult for Vancomycin and Coumadin Indication: leukocytosis/loculated effusion, hx PE/DVT and LVAD  No Known Allergies  Patient Measurements: Height: 5\' 8"  (172.7 cm) Weight: 190 lb 7.6 oz (86.4 kg) IBW/kg (Calculated) : 68.4  Vital Signs: Temp: 97.5 F (36.4 C) (04/05 0800) Temp Source: Oral (04/05 0800) Pulse Rate: 95 (04/05 0800) Intake/Output from previous day: 04/04 0701 - 04/05 0700 In: 760 [P.O.:640; I.V.:20; IV Piggyback:100] Out: 800 [Urine:800] Intake/Output from this shift:    Labs:  Recent Labs  10/03/14 0509 10/04/14 0554 10/05/14 0530  WBC 15.3* 15.8* 26.8*  HGB 8.0* 8.0* 7.2*  PLT 802* 889* 828*  CREATININE 0.69 0.60 0.72   Estimated Creatinine Clearance: 97.1 mL/min (by C-G formula based on Cr of 0.72).  Assessment: 68yom s/p LVAD 3/22, well known to pharmacy from previous antibiotic and anticoagulation dosing, now going back to the OR today for left hemothorax. INR 2.43 but all anticoagulation will be held for now. His WBC are trending up 15.8-->26.8 and CXR shows enlarging pleural effusion - antibiotics will be broadened to vancomycin and zosyn will be continued. Renal function is stable.   Previously, his vancomycin trough was just slightly below goal on 1g q12. His renal function has since improved.  3/17 Right Pleural fluid>> No growth 3/22 Pericardium tissue>> No growth 3/22 Pleural fluid>> No growth 3/22 Resp>>No growth 3/26 Urine >> No growth 3/28 Urine>> No growth  3/17 >> vancomycin>>3/21, resume 4/5>> 3/17 >> ceftaz >> 3/22, 3/24>> 3/28 3/22 >> Zinacef >> [x 48 hrs] 3/22 rifampin x2 complete 3/28 augmentin>>29 3/29 zosyn>>  Goal of Therapy:  Vancomycin trough level 15-20 mcg/ml  Plan:  1) Vancomycin 1g IV q8 2) Hold coumadin, follow up after OR for resumption of anticoagulation  Deboraha Sprang 10/05/2014,9:43 AM

## 2014-10-05 NOTE — CV Procedure (Signed)
Chest Tube Insertion Procedure Note  Indications:  Clinically significant Hemothorax  Pre-operative Diagnosis: Hemothorax  Post-operative Diagnosis: Clotted Hemothorax  Procedure Details  Informed consent was obtained for the procedure, including sedation.  Risks of lung perforation, hemorrhage, arrhythmia, and adverse drug reaction were discussed.   After sterile skin prep, using standard technique, a 32 French tube was placed in the left lateral 7th rib space.  Findings: None  Estimated Blood Loss:  Minimal         Specimens:  None              Complications:  None; patient tolerated the procedure well.         Disposition: performed at bedside         Condition: unstable  Attending Attestation: I performed the procedure.

## 2014-10-05 NOTE — Progress Notes (Signed)
CT Surgery  Patient examined and echo rerviewed at bedside with Dr Aundra Dubin Will prepare for return to OR for L hemothorax he developed overnight Procedure d/w patient

## 2014-10-05 NOTE — Transfer of Care (Signed)
Immediate Anesthesia Transfer of Care Note  Patient: Scott [name]  Procedure(s) Performed: Procedure(s): REDO STERNOTOMY (N/A) TRANSESOPHAGEAL ECHOCARDIOGRAM (TEE) (N/A) EVACUATION OF HEMATOMA  Patient Location: SICU  Anesthesia Type:General  Level of Consciousness: sedated, unresponsive and Patient remains intubated per anesthesia plan  Airway & Oxygen Therapy: Patient remains intubated per anesthesia plan and Patient placed on Ventilator (see vital sign flow sheet for setting)  Post-op Assessment: Report given to RN and Post -op Vital signs reviewed and stable  Post vital signs: Reviewed and stable  Last Vitals:  Filed Vitals:   10/05/14 1030  Pulse: 104  Temp: 36.4 C  Resp:     Complications: No apparent anesthesia complications

## 2014-10-05 NOTE — Anesthesia Preprocedure Evaluation (Addendum)
Anesthesia Evaluation  Patient identified by MRN, date of birth, ID band Patient awake    Reviewed: Allergy & Precautions, NPO status , Patient's Chart, lab work & pertinent test results  Airway Mallampati: II       Dental  (+) Teeth Intact, Partial Upper   Pulmonary former smoker,  + rhonchi   + decreased breath sounds      Cardiovascular hypertension, Rhythm:Regular Rate:Tachycardia     Neuro/Psych    GI/Hepatic   Endo/Other  diabetes  Renal/GU      Musculoskeletal   Abdominal   Peds  Hematology   Anesthesia Other Findings   Reproductive/Obstetrics                         Anesthesia Physical Anesthesia Plan  ASA: IV and emergent  Anesthesia Plan: General   Post-op Pain Management:    Induction: Intravenous  Airway Management Planned: Oral ETT  Additional Equipment: Arterial line, CVP, PA Cath and 3D TEE  Intra-op Plan:   Post-operative Plan: Possible Post-op intubation/ventilation  Informed Consent: I have reviewed the patients History and Physical, chart, labs and discussed the procedure including the risks, benefits and alternatives for the proposed anesthesia with the patient or authorized representative who has indicated his/her understanding and acceptance.     Plan Discussed with: CRNA and Anesthesiologist  Anesthesia Plan Comments:       Anesthesia Quick Evaluation

## 2014-10-05 NOTE — Progress Notes (Addendum)
Patient ID: Scott [name], male   DOB: 07/26/1945, 69 y.o.   MRN: [id number] HeartMate 2 Rounding Note  Subjective:    Post Day 14 - HMII LVAD   Moved to ICU 3/31 for recurrent CO2 retention. Improved with Bipap. This am doing great. Off bipap during day, using at night.   On Zosyn for ?PNA x 7 day course. Maintaining NSR on amio.   Yesterday, ramp echo done with increase speed to 10000.  Overnight, PI fell.  Speed decreased to 9600 and Lasix was stopped.  MAP down to 50s this morning and patient diaphoretic.  CXR showed large left pleural effusion (new from 4/3), hemoglobin down to 7.2. MAP to 60s after bolus NS.   LVAD INTERROGATION:  HeartMate II LVAD:  Flow 5.6 liters/min, speed 9600 power 6.1, PI 2.0   Multiple PI events  Objective:    Vital Signs:   Temp:  [97.4 F (36.3 C)-98.3 F (36.8 C)] 97.4 F (36.3 C) (04/05 0604) Pulse Rate:  [89-115] 106 (04/05 0604) Resp:  [20-26] 23 (04/05 0604) SpO2:  [91 %-99 %] 99 % (04/05 0604) Weight:  [190 lb 7.6 oz (86.4 kg)] 190 lb 7.6 oz (86.4 kg) (04/05 0420) Last BM Date: 10/04/14 Mean arterial Pressure  80s  Intake/Output:   Intake/Output Summary (Last 24 hours) at 10/05/14 0822 Last data filed at 10/05/14 0530  Gross per 24 hour  Intake    470 ml  Output    325 ml  Net    145 ml     Physical Exam: General: diaphoretic HEENT: normal Neck: supple. JVP 8 cm. Carotids 2+ bilat; no bruits. No lymphadenopathy or thryomegaly appreciated.  Cor: Mechanical heart sounds with LVAD hum present.  Lungs: clear Abdomen: soft, nontender, nondistended. No hepatosplenomegaly. No bruits or masses. Good bowel sounds. Driveline: Dressing looks good.  Extremities: no cyanosis, clubbing, rash.  No edema.  Neuro: Mildly lethargic  Telemetry: NSR 80-90s   Labs: Basic Metabolic Panel:  Recent Labs Lab 09/30/14 1150 10/01/14 0401 10/03/14 0509 10/04/14 0554 10/05/14 0530  NA 140 140 138 140 133*  K 4.5 4.3 3.7 3.5 3.9  CL 92*  94* 92* 94* 92*  CO2 38* 39* 37* 38* 34*  GLUCOSE 131* 117* 108* 104* 162*  BUN 12 10 8 7 10   CREATININE 0.76 0.81 0.69 0.60 0.72  CALCIUM 8.7 8.6 8.1* 8.0* 8.2*  MG 2.1  --   --   --   --     Liver Function Tests:  Recent Labs Lab 10/01/14 0401 10/03/14 0509  AST 51* 50*  ALT 59* 53  ALKPHOS 266* 216*  BILITOT 1.0 0.9  PROT 6.9 5.9*  ALBUMIN 2.4* 2.1*   No results for input(s): LIPASE, AMYLASE in the last 168 hours.  Recent Labs Lab 09/30/14 1445  AMMONIA 46*    CBC:  Recent Labs Lab 10/01/14 0401 10/02/14 0516 10/03/14 0509 10/04/14 0554 10/05/14 0530  WBC 15.3* 13.7* 15.3* 15.8* 26.8*  HGB 8.9* 7.9* 8.0* 8.0* 7.2*  HCT 31.9* 28.1* 27.1* 27.8* 23.9*  MCV 96.7 96.2 93.1 94.6 92.3  PLT 812* 821* 802* 889* 828*    INR:  Recent Labs Lab 10/01/14 0401 10/02/14 0516 10/03/14 0509 10/04/14 0554 10/05/14 0530  INR 2.37* 2.54* 2.69* 2.62* 2.43*    Other results:    Imaging: Dg Chest Port 1 View  10/05/2014   CLINICAL DATA:  Left ventricular assist device  EXAM: PORTABLE CHEST - 1 VIEW  COMPARISON:  10/03/2014  FINDINGS:  Inflow tract from the LVAD visible and unchanged imposition.  Prominent enlargement of the cardiopericardial silhouette worsening and large left pleural effusion with very little aerated lung in the left hemithorax currently.  Stable loculated right pleural effusion along the lung base.  Right-sided PICC line tip: Central right atrium.  IMPRESSION: 1. Significantly enlarging left pleural effusion with very little aerated lung on the left remaining. 2. Persistent loculated right basilar effusion. 3. Persistent enlargement of the cardiopericardial silhouette.   Electronically Signed   By: Van Clines M.D.   On: 10/05/2014 07:56     Medications:     Scheduled Medications: . amiodarone  200 mg Oral BID  . aspirin EC  81 mg Oral Daily  . bisacodyl  10 mg Oral Daily  . budesonide-formoterol  2 puff Inhalation BID  .  dextromethorphan-guaiFENesin  1 tablet Oral BID  . docusate sodium  200 mg Oral Daily  . ferrous [id number] C-folic acid  1 capsule Oral TID PC  . gabapentin  300 mg Oral BID  . insulin aspart  0-24 Units Subcutaneous TID AC & HS  . pantoprazole  40 mg Oral Daily  . piperacillin-tazobactam (ZOSYN)  IV  3.375 g Intravenous 3 times per day  . polyethylene glycol  17 g Oral Daily  . rosuvastatin  10 mg Oral q1800  . sodium chloride  10-40 mL Intracatheter Q12H  . sodium chloride  3 mL Intravenous Q12H  . traZODone  50 mg Oral QHS  . vancomycin  1,000 mg Intravenous Q8H    Infusions: . sodium chloride    . sodium chloride 20 mL/hr at 09/23/14 1900  . sodium chloride 75 mL/hr at 10/05/14 0126    PRN Medications: sodium chloride, levalbuterol, magnesium hydroxide, ondansetron (ZOFRAN) IV, sodium chloride, sodium chloride, traMADol   Assessment:    1.  A/C systolic HF class IV with cardiogenic shock- EF 15% with moderate RV dysfunction 3/16 echo.         -S/P HMII LVAD 09/21/14  2.  CAD s/p Anterior STEMI on 06/12/14 with stenting of LAD- on coumadin + plavix, statin.  3. Acute on chronic hypercarbic respiratory failure  4.  06/12/14 VT in setting of STEMI 5.  Ankylosing spondylitis. 6. Bilateral PE with R DVT- S/P IVC filter 08/27/2014--->coumadin 7. New large left pleural effusion 4/5, concern for hemothorax.  8. ?PNA: Zosyn 9. Acute Blood Loss- Anemia- Received 1UPRBCs 3/23   Plan/Discussion:    POD #13 HMII LVAD.   Concern for hemothorax this morning on left with falling PI, falling hemoglobin, falling BP. - Dr Cyndia Bent to place chest tube.  - Will give 2 units PRBCs.  - Limited echo to rule out pericardial effusion. - Lasix stopped.  - Move back to ICU - Speed back to 9400, will reassess in future.   Afebrile but WBCs up a lot.  May be due to blood in pleural space.  For now, will treat with vanco/Zosyn and get blood cultures.   Maintaining NSR on po amio.    On coumadin and LDH stable.  Will not restart Plavix as anterior wall completely necrotic.    I reviewed the LVAD parameters from today, and compared the results to the patient's prior recorded data.  No programming changes were made.  The LVAD is functioning within specified parameters.  The patient performs LVAD self-test daily.  LVAD interrogation was negative for any significant power changes, alarms or PI events/speed drops.  LVAD equipment check completed and is in good working  order.  Back-up equipment present.   LVAD education done on emergency procedures and precautions and reviewed exit site care.  45 min critical care time.   Length of Stay: 25  Loralie Champagne  MD  10/05/2014, 8:22 AM  VAD Team --- VAD ISSUES ONLY--- Pager [phone] (7am - 7am)  Advanced Heart Failure Team  Pager [phone] (M-F; 7a - 4p)  Please contact [facility] for night-coverage after hours (4p -7a ) and weekends on [email or web address]  Chest tube placed with no return.  Suspect clot in chest.  Echo shows small, underfilled LV.  Appears to be possible thrombus material in lung fields. Getting blood, MAP remains low so will return to OR for hemothorax evaluation. D/w Dr [name].   Loralie Champagne 10/05/2014 9:30 AM

## 2014-10-05 NOTE — Brief Op Note (Addendum)
09/10/2014 - 10/05/2014  1:11 PM  PATIENT:  Scott [name]  69 y.o. male  PRE-OPERATIVE DIAGNOSIS:  Left hemothorax  POST-OPERATIVE DIAGNOSIS:  Left hemothorax  PROCEDURE:  TRANSESOPHAGEAL ECHOCARDIOGRAM (TEE), REDO STERNOTOMY,  EXPLORATION FOR BLEEDING-POST Op LVAD, DRAIN LEFT HEMOTHORAX  FINDINGS: Approximately 2900 cc evacuated from chest as well as numerous clots -- no active bleeding but diffuse oozing from raw surfaces from prior post mi pericarditis  SURGEON:  Surgeon(s) and Role:    * Ivin Poot, MD - Primary  PHYSICIAN ASSISTANT: Lars Pinks PA_C  ANESTHESIA:   general  EBL:  Total I/O In: 3122.7 [I.V.:500; Blood:2622.7] Out: 200 [Urine:200]  BLOOD ADMINISTERED:3 CC PRBC and 500 CC CELLSAVER  DRAINS: 43 French chest tube placed in the right pleural space;36 French chest tube placed in the elft pleural space; 63 French and a Bard drain placed in the mediastinal space; and a drain placed in abdominal pocket   COUNTS CORRECT:  YES  DICTATION: .Dragon Dictation  PLAN OF CARE: Admit to inpatient   PATIENT DISPOSITION:  ICU - intubated and hemodynamically stable.   Delay start of Pharmacological VTE agent (>24hrs) due to surgical blood loss or risk of bleeding: yes- hold coumadin until fri 4-9

## 2014-10-05 NOTE — Progress Notes (Signed)
OT Cancellation Note  Patient Details Name: Scott [name] MRN: [id number] DOB: 1945-11-18   Cancelled Treatment:    Reason Eval/Treat Not Completed: Patient at procedure or test/ unavailable (Pt in OR. Developed L hemothorax last night per MD note.)  [name] 10/05/2014, 1:42 PM

## 2014-10-05 NOTE — Progress Notes (Addendum)
CT surgery note  VAD speed increased to 10,000 rpm this am When patient was placed on BIPAP this hs  PI dropped to 1.5-2 with PI events Pump speed reset at 9600 and  IVF ,45 NS at 75cc/ hr started, lasix stopped

## 2014-10-05 NOTE — Progress Notes (Signed)
  Echocardiogram 2D Echocardiogram has been performed.  Donata Clay 10/05/2014, 9:19 AM

## 2014-10-05 NOTE — Progress Notes (Signed)
10/05/2014 12:30 AM When checking the patient's MAP, the Power Index showed 1.5 and was not improving.  Earlier, the patient's PI would go down when changing positions to sit or stand up and then would go back to 2.5 when he laid back. The patient's MAP was 70.  Also, the patient's oxygen saturation had gone down to the 80s% earlier and he was put on 3L of oxygen and again when put on his BiPap.  Dr. Nils Pyle was informed of the patient's condition and said to have [facility] come to the patient's room to change the speed on the machine and to page him when they arrived so he could inform them how to adjust it. Will continue to monitor the patient. Scott [name]

## 2014-10-05 NOTE — Progress Notes (Signed)
Day of Surgery Procedure(s) (LRB): REDO STERNOTOMY (N/A) TRANSESOPHAGEAL ECHOCARDIOGRAM (TEE) (N/A) EVACUATION OF HEMATOMA Subjective: Patient was taken back to OR 2 weeks postop for L hemothorax  and tamponade physiology on LV- findings of diffuse coagulopathy without bleeding at VAD pump connections to LV and ascending aorta Clotted and non clotted blood removed> 2 L with sig improvement in hemodynamics Objective: Vital signs in last 24 hours: Temp:  [96.3 F (35.7 C)-98.8 F (37.1 C)] 98.8 F (37.1 C) (04/05 1945) Pulse Rate:  [25-115] 91 (04/05 1948) Cardiac Rhythm:  [-] Normal sinus rhythm (04/05 1500) Resp:  [10-41] 19 (04/05 1948) SpO2:  [90 %-100 %] 100 % (04/05 1948) FiO2 (%):  [50 %-60 %] 50 % (04/05 1948) Weight:  [190 lb 7.6 oz (86.4 kg)] 190 lb 7.6 oz (86.4 kg) (04/05 0420)  Hemodynamic parameters for last 24 hours: CO:  [4.3 L/min-4.6 L/min] 4.6 L/min CI:  [2.1 L/min/m2-2.3 L/min/m2] 2.3 L/min/m2  Intake/Output from previous day: 04/04 0701 - 04/05 0700 In: 760 [P.O.:640; I.V.:20; IV Piggyback:100] Out: 800 [Urine:800] Intake/Output this shift: Total I/O In: 380 [Blood:330; IV Piggyback:50] Out: -   Sedated on Vent Lungs clear  Lab Results:  Recent Labs  10/05/14 1200  10/05/14 1700 10/05/14 1735 10/05/14 2012  WBC 23.1*  --  22.6*  --   --   HGB 8.5*  < > 7.6* 7.8* 9.2*  HCT 26.7*  < > 22.8* 23.0* 27.0*  PLT 463*  --  330  344  --   --   < > = values in this interval not displayed. BMET:  Recent Labs  10/04/14 0554 10/05/14 0530  10/05/14 1321  10/05/14 1735 10/05/14 2012  NA 140 133*  < > 140  < > 141 140  K 3.5 3.9  < > 3.8  < > 3.9 4.2  CL 94* 92*  < > 94*  --   --  99  CO2 38* 34*  --   --   --   --   --   GLUCOSE 104* 162*  < > 178*  < > 122* 122*  BUN 7 10  < > 13  --   --  14  CREATININE 0.60 0.72  < > 0.70  --   --  0.70  CALCIUM 8.0* 8.2*  --   --   --   --   --   < > = values in this interval not displayed.  PT/INR:  Recent  Labs  10/05/14 1700  LABPROT 23.0*  INR 2.02*   ABG    Component Value Date/Time   PHART 7.504* 10/05/2014 2013   HCO3 30.9* 10/05/2014 2013   TCO2 32 10/05/2014 2013   ACIDBASEDEF 0.8 08/26/2014 2225   O2SAT 99.0 10/05/2014 2013   CBG (last 3)   Recent Labs  10/05/14 0347 10/05/14 0701 10/05/14 0749  GLUCAP 145* 118* 148*    Assessment/Plan: S/P Procedure(s) (LRB): REDO STERNOTOMY (N/A) TRANSESOPHAGEAL ECHOCARDIOGRAM (TEE) (N/A) EVACUATION OF HEMATOMA Keep sedated Follow coagulopathy- INR still elevated despite multiple units FFP   LOS: 25 days    Tharon Aquas Trigt III 10/05/2014

## 2014-10-05 NOTE — Progress Notes (Signed)
Nurse called VAD pager to report pt not doing well, MAP 40, diaphoretic.  Dr. Aundra Dubin, Darrick Grinder, NP, Dr. Cyndia Bent, and VAD coordinator to patient's room.   Pt awake, responsive, MAP difficult to obtain - unable to hear doppler pulsatility. IVF bolus hung; MAP re-checked low 50's - 64.  LVAD speed dropped from 9600 to 9400 due to frequent PI events. Monitor shows sinus rhythm 90's. 1st unit PCs hung.   Hgb 8.0 > 7.2 WBC  15.8 > 26.8 INR:  2.6 > 2.43 LDH:  305 > 371 Co-ox:  58 ABG:  PCO2: 58   PO2 88   Stat CXR obtained and reviewed by Dr. Aundra Dubin and Dr. Cyndia Bent. Pt emergently transferred to 2S13.    Left chest tube inserted per Dr. Cyndia Bent with no drainage noted.  Stat bedside echo obtained, Dr. Aundra Dubin reviewed with Dr. Darcey Nora. Planned return to OR for re-do sternotomy and re-exploration for bleeding.   Left radial A-line placed per anesthesia. CVP 10.  MAPs:  70's  Rhythm: sinus   Speed:  9200  Flow:  3.7  PI:  2.2  Power:  4.6  Events:  30 PI events 10/04/14;  40 PI events 10/05/14  Alarms:  None   2 units FFP given plus 2nd unit PC's prior to transport to OR. Lab tech and anesthesia attempted to draw peripheral blood cultures - unsuccessful.  Wife at bedside and spoke with patient prior to transfer.  VAD coordinator accompanied pt to OR 15 and remained with patient during procedure.  Pt received 3 units PC's, 500 cell saver.  EBL 2900 with large clot burden evacuated.     VAD coordinator accompanied pt to 2S13 with LVAD setting as noted:  Speed 9200  Flow:  4.6  PI:  4.6  Power:  5.3  Both primary and back up controller with fixed speed 9200 and low speed limit 8600.

## 2014-10-05 NOTE — Anesthesia Procedure Notes (Addendum)
Procedure Name: Intubation Date/Time: 10/05/2014 10:58 AM Performed by: Rush Farmer E Pre-anesthesia Checklist: Patient identified, Emergency Drugs available, Suction available, Patient being monitored and Timeout performed Patient Re-evaluated:Patient Re-evaluated prior to inductionOxygen Delivery Method: Circle system utilized Preoxygenation: Pre-oxygenation with 100% oxygen Intubation Type: IV induction, Rapid sequence and Cricoid Pressure applied Tube type: Oral Tube size: 8.0 mm Number of attempts: 1 Airway Equipment and Method: Video-laryngoscopy Secured at: 22 cm Tube secured with: Tape Dental Injury: Teeth and Oropharynx as per pre-operative assessment     Procedures: Right IJ Gordy Councilman Catheter Insertion:[id number]: The patient was identified and consent obtained.  TO was performed, and full barrier precautions were used.  The skin was anesthetized with lidocaine-4cc plain with 25g needle.  Once the vein was located with the 22 ga. needle using ultrasound guidance , the wire was inserted into the vein.  The wire location was confirmed with ultrasound.  The tissue was dilated and the 8.5 [place] cordis catheter was carefully inserted. Afterwards Gordy Councilman catheter was inserted. PA catheter at 45cm.  The patient tolerated the procedure well.   CE

## 2014-10-06 ENCOUNTER — Inpatient Hospital Stay (HOSPITAL_COMMUNITY): Payer: Medicare Other

## 2014-10-06 ENCOUNTER — Inpatient Hospital Stay (HOSPITAL_COMMUNITY): Payer: Medicare Other | Admitting: Certified Registered"

## 2014-10-06 ENCOUNTER — Encounter (HOSPITAL_COMMUNITY)
Admission: AD | Disposition: A | Discharge status: Home Health | Payer: Medicare Other | Source: Ambulatory Visit | Attending: Cardiothoracic Surgery

## 2014-10-06 ENCOUNTER — Encounter (HOSPITAL_COMMUNITY): Payer: Self-pay | Admitting: Certified Registered"

## 2014-10-06 DIAGNOSIS — J90 Pleural effusion, not elsewhere classified: Secondary | ICD-10-CM

## 2014-10-06 DIAGNOSIS — I97618 Postprocedural hemorrhage and hematoma of a circulatory system organ or structure following other circulatory system procedure: Secondary | ICD-10-CM

## 2014-10-06 HISTORY — PX: VIDEO ASSISTED THORACOSCOPY (VATS)/THOROCOTOMY: SHX6173

## 2014-10-06 LAB — CBC
HCT: 21 % — ABNORMAL LOW (ref 39.0–52.0)
HCT: 22.5 % — ABNORMAL LOW (ref 39.0–52.0)
HCT: 23.7 % — ABNORMAL LOW (ref 39.0–52.0)
HCT: 24.6 % — ABNORMAL LOW (ref 39.0–52.0)
HCT: 28.4 % — ABNORMAL LOW (ref 39.0–52.0)
HCT: 28.7 % — ABNORMAL LOW (ref 39.0–52.0)
HEMATOCRIT: 27 % — AB (ref 39.0–52.0)
HEMOGLOBIN: 8.4 g/dL — AB (ref 13.0–17.0)
Hemoglobin: 7.3 g/dL — ABNORMAL LOW (ref 13.0–17.0)
Hemoglobin: 8 g/dL — ABNORMAL LOW (ref 13.0–17.0)
Hemoglobin: 8.5 g/dL — ABNORMAL LOW (ref 13.0–17.0)
Hemoglobin: 9.2 g/dL — ABNORMAL LOW (ref 13.0–17.0)
Hemoglobin: 9.6 g/dL — ABNORMAL LOW (ref 13.0–17.0)
Hemoglobin: 9.9 g/dL — ABNORMAL LOW (ref 13.0–17.0)
MCH: 29.4 pg (ref 26.0–34.0)
MCH: 29.7 pg (ref 26.0–34.0)
MCH: 29.7 pg (ref 26.0–34.0)
MCH: 29.7 pg (ref 26.0–34.0)
MCH: 29.8 pg (ref 26.0–34.0)
MCH: 29.8 pg (ref 26.0–34.0)
MCH: 30 pg (ref 26.0–34.0)
MCHC: 33.4 g/dL (ref 30.0–36.0)
MCHC: 34.1 g/dL (ref 30.0–36.0)
MCHC: 34.6 g/dL (ref 30.0–36.0)
MCHC: 34.8 g/dL (ref 30.0–36.0)
MCHC: 34.9 g/dL (ref 30.0–36.0)
MCHC: 35.4 g/dL (ref 30.0–36.0)
MCHC: 35.6 g/dL (ref 30.0–36.0)
MCV: 83.6 fL (ref 78.0–100.0)
MCV: 83.7 fL (ref 78.0–100.0)
MCV: 85.3 fL (ref 78.0–100.0)
MCV: 86.3 fL (ref 78.0–100.0)
MCV: 86.4 fL (ref 78.0–100.0)
MCV: 87.4 fL (ref 78.0–100.0)
MCV: 88 fL (ref 78.0–100.0)
Platelets: 159 10*3/uL (ref 150–400)
Platelets: 161 10*3/uL (ref 150–400)
Platelets: 208 10*3/uL (ref 150–400)
Platelets: 214 10*3/uL (ref 150–400)
Platelets: 218 10*3/uL (ref 150–400)
Platelets: 225 K/uL (ref 150–400)
Platelets: 270 10*3/uL (ref 150–400)
RBC: 2.43 MIL/uL — ABNORMAL LOW (ref 4.22–5.81)
RBC: 2.69 MIL/uL — ABNORMAL LOW (ref 4.22–5.81)
RBC: 2.83 MIL/uL — AB (ref 4.22–5.81)
RBC: 2.85 MIL/uL — ABNORMAL LOW (ref 4.22–5.81)
RBC: 3.09 MIL/uL — ABNORMAL LOW (ref 4.22–5.81)
RBC: 3.26 MIL/uL — ABNORMAL LOW (ref 4.22–5.81)
RBC: 3.33 MIL/uL — ABNORMAL LOW (ref 4.22–5.81)
RDW: 15 % (ref 11.5–15.5)
RDW: 15.1 % (ref 11.5–15.5)
RDW: 15.1 % (ref 11.5–15.5)
RDW: 15.1 % (ref 11.5–15.5)
RDW: 15.2 % (ref 11.5–15.5)
RDW: 15.2 % (ref 11.5–15.5)
RDW: 15.4 % (ref 11.5–15.5)
WBC: 18.7 10*3/uL — ABNORMAL HIGH (ref 4.0–10.5)
WBC: 21 K/uL — ABNORMAL HIGH (ref 4.0–10.5)
WBC: 22.3 10*3/uL — ABNORMAL HIGH (ref 4.0–10.5)
WBC: 23.5 10*3/uL — ABNORMAL HIGH (ref 4.0–10.5)
WBC: 23.5 10*3/uL — ABNORMAL HIGH (ref 4.0–10.5)
WBC: 25.9 10*3/uL — ABNORMAL HIGH (ref 4.0–10.5)
WBC: 27.5 10*3/uL — AB (ref 4.0–10.5)

## 2014-10-06 LAB — POCT I-STAT 3, ART BLOOD GAS (G3+)
Acid-Base Excess: 6 mmol/L — ABNORMAL HIGH (ref 0.0–2.0)
Acid-Base Excess: 5 mmol/L — ABNORMAL HIGH (ref 0.0–2.0)
Acid-Base Excess: 4 mmol/L — ABNORMAL HIGH (ref 0.0–2.0)
Acid-Base Excess: 5 mmol/L — ABNORMAL HIGH (ref 0.0–2.0)
BICARBONATE: 28.6 meq/L — AB (ref 20.0–24.0)
Bicarbonate: 31 meq/L — ABNORMAL HIGH (ref 20.0–24.0)
Bicarbonate: 31.9 mEq/L — ABNORMAL HIGH (ref 20.0–24.0)
Bicarbonate: 28.6 mEq/L — ABNORMAL HIGH (ref 20.0–24.0)
O2 Saturation: 100 %
O2 Saturation: 100 %
O2 Saturation: 100 %
O2 Saturation: 100 %
PCO2 ART: 60.3 mmHg — AB (ref 35.0–45.0)
PCO2 ART: 40 mmHg (ref 35.0–45.0)
PH ART: 7.327 — AB (ref 7.350–7.450)
PH ART: 7.463 — AB (ref 7.350–7.450)
PO2 ART: 407 mmHg — AB (ref 80.0–100.0)
Patient temperature: 37.8
Patient temperature: 36.2
Patient temperature: 37.4
TCO2: 32 mmol/L (ref 0–100)
TCO2: 34 mmol/L (ref 0–100)
TCO2: 30 mmol/L (ref 0–100)
TCO2: 30 mmol/L (ref 0–100)
pCO2 arterial: 47.2 mmHg — ABNORMAL HIGH (ref 35.0–45.0)
pCO2 arterial: 41.9 mmHg (ref 35.0–45.0)
pH, Arterial: 7.429 (ref 7.350–7.450)
pH, Arterial: 7.441 (ref 7.350–7.450)
pO2, Arterial: 191 mmHg — ABNORMAL HIGH (ref 80.0–100.0)
pO2, Arterial: 161 mmHg — ABNORMAL HIGH (ref 80.0–100.0)
pO2, Arterial: 198 mmHg — ABNORMAL HIGH (ref 80.0–100.0)

## 2014-10-06 LAB — GLUCOSE, CAPILLARY
GLUCOSE-CAPILLARY: 103 mg/dL — AB (ref 70–99)
Glucose-Capillary: 119 mg/dL — ABNORMAL HIGH (ref 70–99)
Glucose-Capillary: 131 mg/dL — ABNORMAL HIGH (ref 70–99)
Glucose-Capillary: 159 mg/dL — ABNORMAL HIGH (ref 70–99)
Glucose-Capillary: 104 mg/dL — ABNORMAL HIGH (ref 70–99)
Glucose-Capillary: 121 mg/dL — ABNORMAL HIGH (ref 70–99)
Glucose-Capillary: 71 mg/dL (ref 70–99)
Glucose-Capillary: 121 mg/dL — ABNORMAL HIGH (ref 70–99)
Glucose-Capillary: 75 mg/dL (ref 70–99)
Glucose-Capillary: 102 mg/dL — ABNORMAL HIGH (ref 70–99)
Glucose-Capillary: 104 mg/dL — ABNORMAL HIGH (ref 70–99)
Glucose-Capillary: 105 mg/dL — ABNORMAL HIGH (ref 70–99)
Glucose-Capillary: 106 mg/dL — ABNORMAL HIGH (ref 70–99)
Glucose-Capillary: 114 mg/dL — ABNORMAL HIGH (ref 70–99)
Glucose-Capillary: 76 mg/dL (ref 70–99)
Glucose-Capillary: 87 mg/dL (ref 70–99)

## 2014-10-06 LAB — POCT I-STAT 7, (LYTES, BLD GAS, ICA,H+H)
ACID-BASE EXCESS: 3 mmol/L — AB (ref 0.0–2.0)
Bicarbonate: 30.3 mEq/L — ABNORMAL HIGH (ref 20.0–24.0)
Calcium, Ion: 1.26 mmol/L (ref 1.13–1.30)
HEMATOCRIT: 28 % — AB (ref 39.0–52.0)
Hemoglobin: 9.5 g/dL — ABNORMAL LOW (ref 13.0–17.0)
O2 SAT: 100 %
PO2 ART: 293 mmHg — AB (ref 80.0–100.0)
Patient temperature: 37.5
Potassium: 4.5 mmol/L (ref 3.5–5.1)
Sodium: 139 mmol/L (ref 135–145)
TCO2: 32 mmol/L (ref 0–100)
pCO2 arterial: 62.6 mmHg (ref 35.0–45.0)
pH, Arterial: 7.296 — ABNORMAL LOW (ref 7.350–7.450)

## 2014-10-06 LAB — COMPREHENSIVE METABOLIC PANEL
ALT: 17 U/L (ref 0–53)
ALT: 19 U/L (ref 0–53)
AST: 26 U/L (ref 0–37)
AST: 31 U/L (ref 0–37)
Albumin: 3.1 g/dL — ABNORMAL LOW (ref 3.5–5.2)
Albumin: 2.8 g/dL — ABNORMAL LOW (ref 3.5–5.2)
Alkaline Phosphatase: 71 U/L (ref 39–117)
Alkaline Phosphatase: 62 U/L (ref 39–117)
Anion gap: 6 (ref 5–15)
Anion gap: 7 (ref 5–15)
BUN: 16 mg/dL (ref 6–23)
BUN: 18 mg/dL (ref 6–23)
CO2: 29 mmol/L (ref 19–32)
CO2: 29 mmol/L (ref 19–32)
Calcium: 9.1 mg/dL (ref 8.4–10.5)
Calcium: 8.7 mg/dL (ref 8.4–10.5)
Chloride: 104 mmol/L (ref 96–112)
Chloride: 104 mmol/L (ref 96–112)
Creatinine, Ser: 0.88 mg/dL (ref 0.50–1.35)
Creatinine, Ser: 1.03 mg/dL (ref 0.50–1.35)
GFR calc Af Amer: >90 mL/min (ref >90)
GFR calc Af Amer: 85 mL/min — ABNORMAL LOW (ref >90)
GFR calc non Af Amer: 88 mL/min — ABNORMAL LOW (ref >90)
GFR calc non Af Amer: 74 mL/min — ABNORMAL LOW (ref >90)
Glucose, Bld: 71 mg/dL (ref 70–99)
Glucose, Bld: 79 mg/dL (ref 70–99)
Potassium: 3.8 mmol/L (ref 3.5–5.1)
Potassium: 4 mmol/L (ref 3.5–5.1)
Sodium: 139 mmol/L (ref 135–145)
Sodium: 140 mmol/L (ref 135–145)
Total Bilirubin: 2.7 mg/dL — ABNORMAL HIGH (ref 0.3–1.2)
Total Bilirubin: 3.9 mg/dL — ABNORMAL HIGH (ref 0.3–1.2)
Total Protein: 4.8 g/dL — ABNORMAL LOW (ref 6.0–8.3)
Total Protein: 4.5 g/dL — ABNORMAL LOW (ref 6.0–8.3)

## 2014-10-06 LAB — MAGNESIUM
MAGNESIUM: 1.2 mg/dL — AB (ref 1.5–2.5)
Magnesium: 1.5 mg/dL (ref 1.5–2.5)

## 2014-10-06 LAB — BASIC METABOLIC PANEL
Anion gap: 7 (ref 5–15)
BUN: 21 mg/dL (ref 6–23)
CO2: 29 mmol/L (ref 19–32)
Calcium: 8.2 mg/dL — ABNORMAL LOW (ref 8.4–10.5)
Chloride: 103 mmol/L (ref 96–112)
Creatinine, Ser: 0.94 mg/dL (ref 0.50–1.35)
GFR calc Af Amer: >90 mL/min (ref >90)
GFR calc non Af Amer: 85 mL/min — ABNORMAL LOW (ref >90)
Glucose, Bld: 103 mg/dL — ABNORMAL HIGH (ref 70–99)
Potassium: 4 mmol/L (ref 3.5–5.1)
Sodium: 139 mmol/L (ref 135–145)

## 2014-10-06 LAB — CALCIUM, IONIZED: Calcium, Ion: 1.2 mmol/L (ref 1.12–1.32)

## 2014-10-06 LAB — FIBRINOGEN: FIBRINOGEN: 261 mg/dL (ref 204–475)

## 2014-10-06 LAB — POCT I-STAT, CHEM 8
BUN: 17 mg/dL (ref 6–23)
BUN: 21 mg/dL (ref 6–23)
BUN: 21 mg/dL (ref 6–23)
CALCIUM ION: 1.06 mmol/L — AB (ref 1.13–1.30)
CALCIUM ION: 1.22 mmol/L (ref 1.13–1.30)
CHLORIDE: 97 mmol/L (ref 96–112)
CHLORIDE: 98 mmol/L (ref 96–112)
CREATININE: 0.9 mg/dL (ref 0.50–1.35)
Calcium, Ion: 1.35 mmol/L — ABNORMAL HIGH (ref 1.13–1.30)
Chloride: 99 mmol/L (ref 96–112)
Creatinine, Ser: 0.9 mg/dL (ref 0.50–1.35)
Creatinine, Ser: 0.9 mg/dL (ref 0.50–1.35)
Glucose, Bld: 162 mg/dL — ABNORMAL HIGH (ref 70–99)
Glucose, Bld: 79 mg/dL (ref 70–99)
Glucose, Bld: 100 mg/dL — ABNORMAL HIGH (ref 70–99)
HCT: 28 % — ABNORMAL LOW (ref 39.0–52.0)
HCT: 23 % — ABNORMAL LOW (ref 39.0–52.0)
HEMATOCRIT: 29 % — AB (ref 39.0–52.0)
HEMOGLOBIN: 9.9 g/dL — AB (ref 13.0–17.0)
HEMOGLOBIN: 7.8 g/dL — AB (ref 13.0–17.0)
Hemoglobin: 9.5 g/dL — ABNORMAL LOW (ref 13.0–17.0)
POTASSIUM: 4 mmol/L (ref 3.5–5.1)
POTASSIUM: 4.1 mmol/L (ref 3.5–5.1)
POTASSIUM: 5.6 mmol/L — AB (ref 3.5–5.1)
SODIUM: 141 mmol/L (ref 135–145)
Sodium: 139 mmol/L (ref 135–145)
Sodium: 140 mmol/L (ref 135–145)
TCO2: 26 mmol/L (ref 0–100)
TCO2: 27 mmol/L (ref 0–100)
TCO2: 24 mmol/L (ref 0–100)

## 2014-10-06 LAB — PROTIME-INR
INR: 1.1 (ref 0.00–1.49)
INR: 1.26 (ref 0.00–1.49)
PROTHROMBIN TIME: 14.3 s (ref 11.6–15.2)
Prothrombin Time: 16 seconds — ABNORMAL HIGH (ref 11.6–15.2)

## 2014-10-06 LAB — PREPARE FRESH FROZEN PLASMA
Unit division: 0
Unit division: 0
Unit division: 0
Unit division: 0
Unit division: 0

## 2014-10-06 LAB — CARBOXYHEMOGLOBIN
Carboxyhemoglobin: 1.6 % — ABNORMAL HIGH (ref 0.5–1.5)
Carboxyhemoglobin: 2 % — ABNORMAL HIGH (ref 0.5–1.5)
Methemoglobin: 1 % (ref 0.0–1.5)
Methemoglobin: 1 % (ref 0.0–1.5)
O2 SAT: 56.6 %
O2 Saturation: 64.9 %
TOTAL HEMOGLOBIN: 7.4 g/dL — AB (ref 13.5–18.0)
Total hemoglobin: 9.5 g/dL — ABNORMAL LOW (ref 13.5–18.0)

## 2014-10-06 LAB — APTT
aPTT: 34 seconds (ref 24–37)
aPTT: 35 seconds (ref 24–37)

## 2014-10-06 LAB — PREPARE RBC (CROSSMATCH)

## 2014-10-06 SURGERY — VIDEO ASSISTED THORACOSCOPY (VATS)/THOROCOTOMY
Anesthesia: General | Site: Chest

## 2014-10-06 MED ORDER — SODIUM CHLORIDE 0.9 % IV SOLN: Freq: Once | INTRAVENOUS | Status: DC

## 2014-10-06 MED ORDER — ALBUMIN HUMAN 5 % IV SOLN
INTRAVENOUS | Status: DC | PRN
  Administered 2014-10-06 (×2): via INTRAVENOUS

## 2014-10-06 MED ORDER — NOREPINEPHRINE BITARTRATE 1 MG/ML IV SOLN
0.0000 ug/min | INTRAVENOUS | Status: DC
  Administered 2014-10-06: 10 ug/min via INTRAVENOUS
  Administered 2014-10-06: 40 ug/min via INTRAVENOUS
  Administered 2014-10-07: 17 ug/min via INTRAVENOUS
  Administered 2014-10-07: 15 ug/min via INTRAVENOUS
  Administered 2014-10-08: 16 ug/min via INTRAVENOUS
  Administered 2014-10-09: 14 ug/min via INTRAVENOUS
  Administered 2014-10-09: 10 ug/min via INTRAVENOUS
  Filled 2014-10-06 (×8): qty 16

## 2014-10-06 MED ORDER — CALCIUM CHLORIDE 10 % IV SOLN
INTRAVENOUS | Status: DC | PRN
  Administered 2014-10-06: 1 g via INTRAVENOUS

## 2014-10-06 MED ORDER — COAGULATION FACTOR VIIA RECOMB 1 MG IV SOLR
2.0000 mg | Freq: Once | INTRAVENOUS | Status: AC
  Administered 2014-10-06: 2 mg via INTRAVENOUS
  Filled 2014-10-06: qty 2

## 2014-10-06 MED ORDER — MIDAZOLAM HCL 2 MG/2ML IJ SOLN
INTRAMUSCULAR | Status: AC
  Filled 2014-10-06: qty 2

## 2014-10-06 MED ORDER — CEFAZOLIN SODIUM-DEXTROSE 2-3 GM-% IV SOLR
INTRAVENOUS | Status: AC
  Filled 2014-10-06: qty 50

## 2014-10-06 MED ORDER — CALCIUM CHLORIDE 10 % IV SOLN
1.0000 g | Freq: Once | INTRAVENOUS | Status: AC
  Administered 2014-10-06: 1 g via INTRAVENOUS

## 2014-10-06 MED ORDER — CHLORHEXIDINE GLUCONATE 0.12 % MT SOLN
15.0000 mL | Freq: Two times a day (BID) | OROMUCOSAL | Status: DC
  Administered 2014-10-06 – 2014-10-11 (×10): 15 mL via OROMUCOSAL
  Filled 2014-10-06 (×9): qty 15

## 2014-10-06 MED ORDER — ROCURONIUM BROMIDE 50 MG/5ML IV SOLN
INTRAVENOUS | Status: AC
  Filled 2014-10-06: qty 2

## 2014-10-06 MED ORDER — ROCURONIUM BROMIDE 100 MG/10ML IV SOLN
INTRAVENOUS | Status: DC | PRN
  Administered 2014-10-06 (×2): 50 mg via INTRAVENOUS

## 2014-10-06 MED ORDER — ALBUMIN HUMAN 5 % IV SOLN
12.5000 g | Freq: Once | INTRAVENOUS | Status: AC
  Administered 2014-10-06: 12.5 g via INTRAVENOUS

## 2014-10-06 MED ORDER — SODIUM CHLORIDE 0.9 % IV SOLN
1.0000 g/h | INTRAVENOUS | Status: AC
  Administered 2014-10-06: 1 g/h via INTRAVENOUS
  Filled 2014-10-06: qty 20

## 2014-10-06 MED ORDER — AMIODARONE HCL 200 MG PO TABS
200.0000 mg | ORAL_TABLET | Freq: Two times a day (BID) | ORAL | Status: DC
  Administered 2014-10-06 – 2014-10-07 (×3): 200 mg via ORAL
  Filled 2014-10-06 (×4): qty 1

## 2014-10-06 MED ORDER — CETYLPYRIDINIUM CHLORIDE 0.05 % MT LIQD
7.0000 mL | Freq: Four times a day (QID) | OROMUCOSAL | Status: DC
  Administered 2014-10-07 – 2014-10-11 (×17): 7 mL via OROMUCOSAL

## 2014-10-06 MED ORDER — FENTANYL CITRATE 0.05 MG/ML IJ SOLN
INTRAMUSCULAR | Status: AC
  Filled 2014-10-06: qty 5

## 2014-10-06 MED ORDER — SODIUM CHLORIDE 0.9 % IV SOLN
Freq: Once | INTRAVENOUS | Status: AC
  Administered 2014-10-07: 25 mL via INTRAVENOUS

## 2014-10-06 MED ORDER — PIPERACILLIN-TAZOBACTAM 3.375 G IVPB
3.3750 g | Freq: Three times a day (TID) | INTRAVENOUS | Status: AC
  Administered 2014-10-06 – 2014-10-14 (×26): 3.375 g via INTRAVENOUS
  Filled 2014-10-06 (×26): qty 50

## 2014-10-06 MED ORDER — PANTOPRAZOLE SODIUM 40 MG IV SOLR
40.0000 mg | Freq: Every day | INTRAVENOUS | Status: DC
  Administered 2014-10-06 – 2014-10-07 (×2): 40 mg via INTRAVENOUS
  Filled 2014-10-06 (×4): qty 40

## 2014-10-06 MED ORDER — FUROSEMIDE 10 MG/ML IJ SOLN: 20.0000 mg | Freq: Once | INTRAMUSCULAR | Status: DC

## 2014-10-06 MED ORDER — CALCIUM CHLORIDE 10 % IV SOLN
INTRAVENOUS | Status: AC
  Filled 2014-10-06: qty 20

## 2014-10-06 MED ORDER — SODIUM CHLORIDE 0.9 % IV SOLN
40.0000 ug | Freq: Once | INTRAVENOUS | Status: AC
  Administered 2014-10-06: 40 ug via INTRAVENOUS
  Filled 2014-10-06: qty 10

## 2014-10-06 MED ORDER — HEMOSTATIC AGENTS (NO CHARGE) OPTIME
TOPICAL | Status: DC | PRN
  Administered 2014-10-06 (×2): 1 via TOPICAL

## 2014-10-06 MED ORDER — VANCOMYCIN HCL IN DEXTROSE 1-5 GM/200ML-% IV SOLN
1000.0000 mg | Freq: Two times a day (BID) | INTRAVENOUS | Status: DC
  Administered 2014-10-06 – 2014-10-10 (×10): 1000 mg via INTRAVENOUS
  Filled 2014-10-06 (×12): qty 200

## 2014-10-06 MED ORDER — CEFAZOLIN SODIUM 1-5 GM-% IV SOLN
INTRAVENOUS | Status: DC | PRN
  Administered 2014-10-06: 2 g via INTRAVENOUS

## 2014-10-06 MED ORDER — FUROSEMIDE 10 MG/ML IJ SOLN
20.0000 mg | Freq: Once | INTRAMUSCULAR | Status: AC
  Administered 2014-10-06: 20 mg via INTRAVENOUS

## 2014-10-06 MED ORDER — LACTATED RINGERS IV SOLN
INTRAVENOUS | Status: DC | PRN
  Administered 2014-10-06: 06:00:00 via INTRAVENOUS

## 2014-10-06 MED ORDER — 0.9 % SODIUM CHLORIDE (POUR BTL) OPTIME
TOPICAL | Status: DC | PRN
  Administered 2014-10-06: 5000 mL

## 2014-10-06 MED ORDER — FENTANYL CITRATE 0.05 MG/ML IJ SOLN
INTRAMUSCULAR | Status: DC | PRN
  Administered 2014-10-06: 50 ug via INTRAVENOUS

## 2014-10-06 MED ORDER — MIDAZOLAM HCL 5 MG/5ML IJ SOLN
INTRAMUSCULAR | Status: DC | PRN
  Administered 2014-10-06: 2 mg via INTRAVENOUS

## 2014-10-06 MED ORDER — LACTATED RINGERS IV SOLN: INTRAVENOUS | Status: DC | PRN

## 2014-10-06 MED ORDER — DEXMEDETOMIDINE HCL IN NACL 200 MCG/50ML IV SOLN
INTRAVENOUS | Status: AC
  Filled 2014-10-06: qty 50

## 2014-10-06 MED ORDER — DEXTROSE 5 % IV SOLN
1.5000 g | Freq: Two times a day (BID) | INTRAVENOUS | Status: DC
  Filled 2014-10-06 (×2): qty 1.5

## 2014-10-06 MED ORDER — FUROSEMIDE 10 MG/ML IJ SOLN
INTRAMUSCULAR | Status: DC | PRN
  Administered 2014-10-06: 20 mg via INTRAMUSCULAR

## 2014-10-06 MED ORDER — FENTANYL CITRATE 0.05 MG/ML IJ SOLN
50.0000 ug | INTRAMUSCULAR | Status: DC | PRN
  Administered 2014-10-06 (×5): 50 ug via INTRAVENOUS
  Filled 2014-10-06 (×5): qty 2

## 2014-10-06 MED FILL — Sodium Chloride IV Soln 0.9%: INTRAVENOUS | Qty: 2000 | Status: AC

## 2014-10-06 SURGICAL SUPPLY — 102 items
ADAPTER CARDIO PERF ANTE/RETRO (ADAPTER) ×4 IMPLANT
BAG DECANTER FOR FLEXI CONT (MISCELLANEOUS) ×4 IMPLANT
BANDAGE ELASTIC 4 VELCRO ST LF (GAUZE/BANDAGES/DRESSINGS) ×4 IMPLANT
BANDAGE ELASTIC 6 VELCRO ST LF (GAUZE/BANDAGES/DRESSINGS) ×4 IMPLANT
BANDAGE HEMOSTAT MRDH 4X4 STRL (MISCELLANEOUS) ×2 IMPLANT
BASKET HEART  (ORDER IN 25'S) (MISCELLANEOUS) ×1
BASKET HEART (ORDER IN 25'S) (MISCELLANEOUS) ×1
BASKET HEART (ORDER IN 25S) (MISCELLANEOUS) ×2 IMPLANT
BLADE STERNUM SYSTEM 6 (BLADE) ×4 IMPLANT
BLADE SURG 12 STRL SS (BLADE) ×4 IMPLANT
BLADE SURG ROTATE 9660 (MISCELLANEOUS) IMPLANT
BNDG GAUZE ELAST 4 BULKY (GAUZE/BANDAGES/DRESSINGS) ×4 IMPLANT
BNDG HEMOSTAT MRDH 4X4 STRL (MISCELLANEOUS) ×4
CANISTER SUCTION 2500CC (MISCELLANEOUS) ×4 IMPLANT
CANNULA GUNDRY RCSP 15FR (MISCELLANEOUS) ×4 IMPLANT
CATH CPB KIT VANTRIGT (MISCELLANEOUS) IMPLANT
CATH ROBINSON RED A/P 18FR (CATHETERS) ×4 IMPLANT
CATH THORACIC 36FR RT ANG (CATHETERS) ×4 IMPLANT
COVER SURGICAL LIGHT HANDLE (MISCELLANEOUS) ×4 IMPLANT
CRADLE DONUT ADULT HEAD (MISCELLANEOUS) ×4 IMPLANT
DRAIN CHANNEL 32F RND 10.7 FF (WOUND CARE) ×4 IMPLANT
DRAPE CARDIOVASCULAR INCISE (DRAPES) ×2
DRAPE INCISE IOBAN 66X45 STRL (DRAPES) ×4 IMPLANT
DRAPE SLUSH/WARMER DISC (DRAPES) ×4 IMPLANT
DRAPE SRG 135X102X78XABS (DRAPES) ×2 IMPLANT
DRSG AQUACEL AG ADV 3.5X14 (GAUZE/BANDAGES/DRESSINGS) ×4 IMPLANT
ELECT BLADE 4.0 EZ CLEAN MEGAD (MISCELLANEOUS) ×12
ELECT BLADE 6.5 EXT (BLADE) ×4 IMPLANT
ELECT CAUTERY BLADE 6.4 (BLADE) ×4 IMPLANT
ELECT REM PT RETURN 9FT ADLT (ELECTROSURGICAL) ×8
ELECTRODE BLDE 4.0 EZ CLN MEGD (MISCELLANEOUS) ×6 IMPLANT
ELECTRODE REM PT RTRN 9FT ADLT (ELECTROSURGICAL) ×4 IMPLANT
GAUZE SPONGE 4X4 12PLY STRL (GAUZE/BANDAGES/DRESSINGS) ×8 IMPLANT
GLOVE BIO SURGEON STRL SZ7.5 (GLOVE) ×12 IMPLANT
GOWN STRL REUS W/ TWL LRG LVL3 (GOWN DISPOSABLE) ×8 IMPLANT
GOWN STRL REUS W/TWL LRG LVL3 (GOWN DISPOSABLE) ×8
HEMOSTAT POWDER SURGIFOAM 1G (HEMOSTASIS) ×12 IMPLANT
HEMOSTAT SURGICEL 2X14 (HEMOSTASIS) ×4 IMPLANT
INSERT FOGARTY XLG (MISCELLANEOUS) IMPLANT
KIT BASIN OR (CUSTOM PROCEDURE TRAY) ×4 IMPLANT
KIT ROOM TURNOVER OR (KITS) ×4 IMPLANT
KIT SUCTION CATH 14FR (SUCTIONS) ×8 IMPLANT
KIT VASOVIEW W/TROCAR VH 2000 (KITS) ×4 IMPLANT
LEAD PACING MYOCARDI (MISCELLANEOUS) ×4 IMPLANT
MARKER GRAFT CORONARY BYPASS (MISCELLANEOUS) ×12 IMPLANT
MASK FACE 3 LYR ANT FOG FR FLM (MASK) ×2 IMPLANT
MASK SURG FACE ANTI FOG ADLT (MASK) ×2
NS IRRIG 1000ML POUR BTL (IV SOLUTION) ×20 IMPLANT
PACK OPEN HEART (CUSTOM PROCEDURE TRAY) ×4 IMPLANT
PAD ARMBOARD 7.5X6 YLW CONV (MISCELLANEOUS) ×8 IMPLANT
PAD ELECT DEFIB RADIOL ZOLL (MISCELLANEOUS) ×4 IMPLANT
PENCIL BUTTON HOLSTER BLD 10FT (ELECTRODE) ×4 IMPLANT
PUNCH AORTIC ROTATE 4.0MM (MISCELLANEOUS) IMPLANT
PUNCH AORTIC ROTATE 4.5MM 8IN (MISCELLANEOUS) IMPLANT
PUNCH AORTIC ROTATE 5MM 8IN (MISCELLANEOUS) IMPLANT
SPONGE GAUZE 4X4 12PLY STER LF (GAUZE/BANDAGES/DRESSINGS) ×8 IMPLANT
SPONGE TONSIL 1.25 RF SGL STRG (GAUZE/BANDAGES/DRESSINGS) ×4 IMPLANT
SURGIFLO W/THROMBIN 8M KIT (HEMOSTASIS) ×4 IMPLANT
SUT BONE WAX W31G (SUTURE) IMPLANT
SUT CHROMIC 3 0 SH 27 (SUTURE) ×12 IMPLANT
SUT ETHIBOND 2 0 SH (SUTURE) ×6
SUT ETHIBOND 2 0 SH 36X2 (SUTURE) ×6 IMPLANT
SUT ETHILON 3 0 FSL (SUTURE) ×8 IMPLANT
SUT MNCRL AB 4-0 PS2 18 (SUTURE) IMPLANT
SUT PROLENE 3 0 SH DA (SUTURE) ×4 IMPLANT
SUT PROLENE 3 0 SH1 36 (SUTURE) IMPLANT
SUT PROLENE 4 0 RB 1 (SUTURE) ×8
SUT PROLENE 4 0 SH DA (SUTURE) ×8 IMPLANT
SUT PROLENE 4-0 RB1 .5 CRCL 36 (SUTURE) ×8 IMPLANT
SUT PROLENE 5 0 C 1 36 (SUTURE) IMPLANT
SUT PROLENE 6 0 C 1 30 (SUTURE) IMPLANT
SUT PROLENE 6 0 CC (SUTURE) IMPLANT
SUT PROLENE 8 0 BV175 6 (SUTURE) IMPLANT
SUT PROLENE BLUE 7 0 (SUTURE) ×4 IMPLANT
SUT SILK  1 MH (SUTURE) ×8
SUT SILK 1 MH (SUTURE) ×8 IMPLANT
SUT SILK 2 0 SH CR/8 (SUTURE) ×8 IMPLANT
SUT SILK 3 0 SH CR/8 (SUTURE) IMPLANT
SUT STEEL 6MS V (SUTURE) IMPLANT
SUT STEEL SZ 6 DBL 3X14 BALL (SUTURE) IMPLANT
SUT VIC AB 1 CTX 18 (SUTURE) ×4 IMPLANT
SUT VIC AB 1 CTX 36 (SUTURE) ×4
SUT VIC AB 1 CTX36XBRD ANBCTR (SUTURE) ×4 IMPLANT
SUT VIC AB 2-0 CT1 27 (SUTURE)
SUT VIC AB 2-0 CT1 TAPERPNT 27 (SUTURE) IMPLANT
SUT VIC AB 2-0 CTX 27 (SUTURE) ×4 IMPLANT
SUT VIC AB 3-0 SH 8-18 (SUTURE) ×4 IMPLANT
SUT VIC AB 3-0 X1 27 (SUTURE) ×4 IMPLANT
SUT VICRYL 2 TP 1 (SUTURE) ×4 IMPLANT
SUTURE E-PAK OPEN HEART (SUTURE) ×4 IMPLANT
SYSTEM SAHARA CHEST DRAIN ATS (WOUND CARE) ×8 IMPLANT
TAPE CLOTH SURG 4X10 WHT LF (GAUZE/BANDAGES/DRESSINGS) ×8 IMPLANT
TAPE PAPER 2X10 WHT MICROPORE (GAUZE/BANDAGES/DRESSINGS) ×4 IMPLANT
TOWEL OR 17X24 6PK STRL BLUE (TOWEL DISPOSABLE) ×4 IMPLANT
TOWEL OR 17X26 10 PK STRL BLUE (TOWEL DISPOSABLE) ×8 IMPLANT
TRAY FOLEY IC TEMP SENS 16FR (CATHETERS) IMPLANT
TUBE CONNECTING 12'X1/4 (SUCTIONS) ×1
TUBE CONNECTING 12X1/4 (SUCTIONS) ×3 IMPLANT
TUBING INSUFFLATION (TUBING) IMPLANT
UNDERPAD 30X30 INCONTINENT (UNDERPADS AND DIAPERS) IMPLANT
WATER STERILE IRR 1000ML POUR (IV SOLUTION) ×4 IMPLANT
YANKAUER SUCT BULB TIP NO VENT (SUCTIONS) ×4 IMPLANT

## 2014-10-06 NOTE — Progress Notes (Signed)
Pt changed from [facility] wall power to battery for transport to OR.  Wife notified by Dr. [name].  Pt to be reconnected to [facility] wall power by Cloyde Reams, [place] coordinator.  Vista Lawman, RN

## 2014-10-06 NOTE — Progress Notes (Signed)
Anesthesiology Follow-up:  Mr. Giangregorio remains ssdated on the vent. Opens eyes, following commands. Hemodynamically stable since returning from the OR this morning. Total chest tube output 40-80 cc/hr over last 4 hours.  VS: T-37.9 BP 86/76 (80) HR-120 (Sinus tach)  PAP 83/78 CVP 9 CO/CI 6.7/3.3   VAD Parameters: Rate 9200 PI 5.2 Power 5.8 Flow 5.8 lpm  FiO2 0.5 TV-650 RR-16 PEEP 5   PH 7.46 PCO2 40 PO2 198  H/H 8.4/23.7 Plts 161,000 BUN/CR 21/0.94 K-4.0 glucose 103  He appears much more stable since returning from over for L chest exploration and evacuation of hematoma. Bleeding improved, neuro intact, he remains hemodynamically stable, respiratory function stable.  Roberts Gaudy

## 2014-10-06 NOTE — Anesthesia Preprocedure Evaluation (Addendum)
Anesthesia Evaluation  Patient identified by MRN, date of birth, ID bandGeneral Assessment Comment:Sedated on vent  Reviewed: Allergy & Precautions, NPO status , Patient's Chart, lab work & pertinent test results, Unable to perform ROS - Chart review onlyPreop documentation limited or incomplete due to emergent nature of procedure.  History of Anesthesia Complications Negative for: history of anesthetic complications  Airway Mallampati: Intubated       Dental   Pulmonary former smoker, PE (IVC filter) Intubated and ventilated with LVAD L pleural effusion: heme   + decreased breath sounds      Cardiovascular hypertension, + CAD, + Past MI, + Cardiac Stents, + Peripheral Vascular Disease, +CHF and DVT + dysrhythmias Ventricular Tachycardia Rhythm:Regular Rate:Normal  Requiring Levophed, epi, milrinone for hemodynamic support LVAD   Neuro/Psych    GI/Hepatic negative GI ROS, Neg liver ROS,   Endo/Other  diabetes (glu 121)  Renal/GU negative Renal ROS     Musculoskeletal   Abdominal   Peds  Hematology  (+) Blood dyscrasia (Hb 9.2), ,   Anesthesia Other Findings   Reproductive/Obstetrics                           Anesthesia Physical Anesthesia Plan  ASA: IV and emergent  Anesthesia Plan: General   Post-op Pain Management:    Induction: Intravenous and Inhalational  Airway Management Planned: Double Lumen EBT  Additional Equipment: Arterial line, CVP, PA Cath and 3D TEE  Intra-op Plan:   Post-operative Plan: Post-operative intubation/ventilation  Informed Consent: I have reviewed the patients History and Physical, chart, labs and discussed the procedure including the risks, benefits and alternatives for the proposed anesthesia with the patient or authorized representative who has indicated his/her understanding and acceptance.   Only emergency history available  Plan Discussed with:  Anesthesiologist, Surgeon and CRNA  Anesthesia Plan Comments: (Plan routine and existing monitors, GETA with DLT and TEE.)       Anesthesia Quick Evaluation

## 2014-10-06 NOTE — Progress Notes (Signed)
HR now sustaining 130 after recent changes to epi and dopamine.  Dr. [name] notified.  Orders received to discontinue dopamine and increase PEEP to 8.  Changes made, will continue to monitor.  Vista Lawman, RN

## 2014-10-06 NOTE — Progress Notes (Signed)
CT Surgery  AM Hb down to 7.2 Levophed at higher dose this am With evidence of continued bleeding in chest will plan to take patient back to OR    for exploration through Left thoracotomy. I have discussed the events of the night with the patients wife Darreon Lutes and she has given consent to procede with exploration for bleeding through Left thoracotomy  Ivin Poot MD

## 2014-10-06 NOTE — Anesthesia Procedure Notes (Signed)
Procedure Name: Intubation Date/Time: 10/06/2014 6:05 AM Performed by: Manuela Schwartz B Pre-anesthesia Checklist: Patient identified, Emergency Drugs available, Suction available, Patient being monitored and Timeout performed Patient Re-evaluated:Patient Re-evaluated prior to inductionOxygen Delivery Method: Circle system utilized Grade View: Grade I Endobronchial tube: Left, Double lumen EBT, EBT position confirmed by auscultation and EBT position confirmed by fiberoptic bronchoscope and 37 Fr Number of attempts: 1 Airway Equipment and Method: Stylet,  Video-laryngoscopy and Bougie stylet Placement Confirmation: ETT inserted through vocal cords under direct vision,  positive ETCO2 and breath sounds checked- equal and bilateral Tube secured with: Tape Dental Injury: Teeth and Oropharynx as per pre-operative assessment

## 2014-10-06 NOTE — Progress Notes (Signed)
1 Day Post-Op Procedure(s) (LRB): REDO STERNOTOMY (N/A) TRANSESOPHAGEAL ECHOCARDIOGRAM (TEE) (N/A) EVACUATION OF HEMATOMA Subjective: Status post redo sternotomy for evaluation of mediastinal and left pleural hematoma Called to see patient because of low mean arterial pressure and chest x-ray showing new left postoperative pleural effusion. Hemoglobin has been stable for the past 6 hours. LVAD flows are low, cardiac index 2.0. Norepinephrine requirement has increased steadily through the night A new left chest tube was placed with mild drainage of dark bloody fluid. Follow-up chest x-ray shows mild improvement in left pleural effusion.  Objective: Vital signs in last 24 hours: Temp:  [96.3 F (35.7 C)-100.6 F (38.1 C)] 100.2 F (37.9 C) (04/06 0300) Pulse Rate:  [25-128] 103 (04/05 2329) Cardiac Rhythm:  [-] Normal sinus rhythm (04/05 2000) Resp:  [10-41] 22 (04/06 0300) SpO2:  [90 %-100 %] 100 % (04/05 2329) Arterial Line BP: (58-82)/(55-74) 74/69 mmHg (04/06 0300) FiO2 (%):  [50 %-60 %] 50 % (04/05 2329) Weight:  [190 lb 7.6 oz (86.4 kg)] 190 lb 7.6 oz (86.4 kg) (04/05 0420)  Hemodynamic parameters for last 24 hours: PAP: (18-29)/(6-22) 24/16 mmHg CO:  [4.1 L/min-5.1 L/min] 4.1 L/min CI:  [2 L/min/m2-2.5 L/min/m2] 2 L/min/m2  Intake/Output from previous day: 04/05 0701 - 04/06 0700 In: 12600.1 [I.V.:6155.4; Blood:4994.7; IV Piggyback:1450] Out: 0938 [Urine:1050; Blood:1000; Chest Tube:1820] Intake/Output this shift: Total I/O In: 2737.1 [I.V.:783.1; Blood:1654; IV Piggyback:300] Out: 1829 [Urine:245; Chest Tube:1030]  Patient is sedated but responsive moves all extremities Breath sounds diminished on the left side Loud VAD humming sound over left chest Periphery cool Abdomen soft  Lab Results:  Recent Labs  10/05/14 2220 10/06/14 0050 10/06/14 0055  WBC 22.3* 18.7*  --   HGB 8.5* 9.6* 9.5*  HCT 24.6* 28.7* 28.0*  PLT 270 208  --    BMET:  Recent Labs  10/04/14 0554 10/05/14 0530  10/05/14 2012 10/06/14 0055  NA 140 133*  < > 140 139  K 3.5 3.9  < > 4.2 5.6*  CL 94* 92*  < > 99 97  CO2 38* 34*  --   --   --   GLUCOSE 104* 162*  < > 122* 162*  BUN 7 10  < > 14 17  CREATININE 0.60 0.72  < > 0.70 0.90  CALCIUM 8.0* 8.2*  --   --   --   < > = values in this interval not displayed.  PT/INR:  Recent Labs  10/05/14 2000  LABPROT 13.7  INR 1.04   ABG    Component Value Date/Time   PHART 7.504* 10/05/2014 2013   HCO3 30.9* 10/05/2014 2013   TCO2 26 10/06/2014 0055   ACIDBASEDEF 0.8 08/26/2014 2225   O2SAT 64.9 10/06/2014 0050   CBG (last 3)   Recent Labs  10/05/14 1735 10/05/14 1814 10/05/14 1929  GLUCAP 124* 130* 127*    Assessment/Plan: S/P Procedure(s) (LRB): REDO STERNOTOMY (N/A) TRANSESOPHAGEAL ECHOCARDIOGRAM (TEE) (N/A) EVACUATION OF HEMATOMA Patient with suboptimal hemodynamics requiring high-dose norepinephrine Evidence of reaccumulation of hematoma from coagulopathy in left chest Will follow after new chest tube placement but may need reexploration for evacuation of recurrent hematoma despite aggressive blood product replacement and attempts to reverse coagulopathy   Scott [name] 10/06/2014

## 2014-10-06 NOTE — Progress Notes (Signed)
Patient ID: Scott [name], male   DOB: 05-24-1946, 69 y.o.   MRN: [id number]  SICU Evening Rounds:  Hemodynamically stable on milrinone 0.25, epi 5, levo 15, NO 20. Comfortable on vent  CI 4 Pump parameters all look good. CVP 9.  Chest tube output has decreased significantly. The left pleural drainage is thin, serosanguinous.  Hgb was 8.4 this afternoon and received a unit of PRBC's. Repeat pending this pm.  UO ok

## 2014-10-06 NOTE — Progress Notes (Signed)
Notified Dr. [name] of decreasing MAP, flow down to 2.7.  Orders given for amp calcium chloride and albumin.  MD coming to bedside.  Vista Lawman, RN

## 2014-10-06 NOTE — Brief Op Note (Signed)
09/10/2014 - 10/06/2014  8:14 AM  PATIENT:  Scott [name]  69 y.o. male  PRE-OPERATIVE DIAGNOSIS:  Bleeding  POST-OPERATIVE DIAGNOSIS:  Bleeding  PROCEDURE:  LEFT THORACOTOMY for EVACUATION OF LEFT CHEST HEMATOMA, CAUTERIZATION of BLEEDING SITES  SURGEON:  Surgeon(s) and Role:    * Ivin Poot, MD - Primary    * Ivin Poot, MD  PHYSICIAN ASSISTANT: Lars Pinks PA-C   ANESTHESIA:   general  EBL:  Total I/O In: 0211 [I.V.:550; Blood:839; IV Piggyback:250] Out: -   DRAINS: 2 80 French chest tubes placed in the left pleural space   COUNTS CORRECT: YES  DICTATION: .Dragon Dictation  PLAN OF CARE: Admit to inpatient   PATIENT DISPOSITION:  ICU - intubated and hemodynamically stable.   Delay start of Pharmacological VTE agent (>24hrs) due to surgical blood loss or risk of bleeding: yes

## 2014-10-06 NOTE — OR Nursing (Signed)
7:45 - 1st call to SICU, 08:05 - 08:05 - 2nd call to SICU

## 2014-10-06 NOTE — Progress Notes (Addendum)
Dr. [name] called about pt, updated.  Orders received to increase epi to 3, max dose levo 20, start dopamine at 3 and give FFP x2.   Vista Lawman RN

## 2014-10-06 NOTE — Progress Notes (Signed)
Dr. Darcey Nora called VAD pager to report pt is returning to OR for re-exploration due to continued bleeding.  VAD coordinator met team in [place], ICU nurse handed off to VAD coordinator, report given.  Overnight in ICU, pt received 3 units PCs, 2 FFP, 1 plt, 1 cryo, and three Factor VII, and one DDAVP.    MAP: 40's Flow:  --- Speed:  9200 PI:  2.5 Power:  4.6 Events:  4 PI events over last few hours Alarms:  None since intra-op yesterday afternoon  Hgb 8.0 > 7.2 > 8.5 > 7.6 > 9.1 > 8.5 > 9.6 > 7.3 WBC  15.8 > 26.8 > 18.7 > 21.0 INR:  2.6 > 2.43 > 1.78 > 1.0 LDH:  305 > 371 Creat:  .72 > .88 > .90 Co-ox:  58 > 65 > 56  Thoracotomy/VATS performed by Dr. Darcey Nora with EBL 800 ccs and large amount clot removal. Pt received 2 PCs, 2 cryo, and cell saver 300 intra-op.  CXR obtained prior to leaving OR and reviewed by Dr. [name].    VAD coordinator accompanied pt to 2S13 with LVAD setting as noted:  Speed 9200  Flow:  5.7  PI:  6.8  Power:  5.8  Events:  4 PI events  Alarms:  none  Both primary and back up controller with fixed speed 9200 and low speed limit 8600.

## 2014-10-06 NOTE — CV Procedure (Signed)
Intra-operative Transesophageal Echocardiography Report:  Date of Study: 10/05/14 Date of Report:10/06/14  [name] is a 69 year old male with severe  ischemic cardiomyopathy who developed worsening systolic heart failure and cardiogenic shock with an ejection fraction of 20%. He underwent insertion of a HeartMate 2 Left Ventricular Assist device on 09/21/2014. He then developed  bleeding into the mediastinum and the left pleural space and is now brought to the operating room for mediastinal exploration by Dr [name].  The patient was brought to the operating room. Following induction of anesthesia and endotracheal intubation, the transesophageal echocardiography probe was inserted into the esophagus without difficulty.  Impression:  1. Aortic Valve: The aortic valve was trileaflet. The valve was not opening during the exam. There was a jet of aortic insufficiency which originated in the center of the valve and was directed toward the undersurface of the anterior leaflet of the mitral valve. This was graded as 2+  2. Mitral valve: The mitral leaflets were initially distorted due to collapse of the left ventricle. However following resuscitation and volume loading of the left ventricle, the mitral leaflets were better visualized and  appeared to coapt well. There was trace to 1+ mitral insufficiency.  3. Left ventricle: The LV cavity was almost completely  collapsed. The  papillary muscles were touching. There appeared to be extrinsic compression of the LV cavity from the left pleural hematoma and fluid collection. However following evacuation of hematoma and volume resuscitation there was marked improvement in filling of the left ventricle. There was severe global left ventricular hypokinesis. The LVAD inflow cannula was imaged and noted to be at the apex but somewhat directed toward the inter-ventricular septum. There was no turbulent flow into the LVAD cannula.  4. Right ventricle:  Assessment of the right ventricular function initially was very difficult due to distortion from the left ventricular collapse. However following volume resuscitation and restoration of left ventricular filling, the right ventricular function could be adequately assessed and there appeared to be mild to moderate right ventricular dysfunction.  5. Tricuspid valve: The tricuspid leaflets were well visualized and there was trace to 1+ tricuspid insufficiency.  6. Left pleural space: There was a large hematoma and a fluid collection within the left pleural space. This appeared to be impinging upon the left ventricular cavity causing extrinsic compression. There was reduction in the  size of the left pleural fluid collection following evacuation of blood and clots from the left pleural space. However there was persistent  clot and fluid visualized within the left pleural space at the end of the procedure.  7. Mediastinum: There appeared to be fluid within the anterior mediastinum adjacent to the right ventricle.  8. Pericardial space: There did not appear to be noticeable fluid or blood within the pericardial space.  9. Ascending aorta: The ascending aorta was well visualized and the outflow cannula anastomosis to the ascending aorta was imaged. There appeared to be normal  flow into the  ascending aorta from the outflow cannula without excessive turbulence.  Roberts Gaudy, M.D.

## 2014-10-06 NOTE — Progress Notes (Signed)
EKG CRITICAL VALUE     12 lead EKG performed.  Critical value noted. Vickii Penna, RN notified.   Yehuda Mao, CCT 10/06/2014 10:42 AM

## 2014-10-06 NOTE — Progress Notes (Signed)
Echocardiogram Echocardiogram Transesophageal has been performed.  [name] 10/06/2014, 7:24 AM

## 2014-10-06 NOTE — Op Note (Signed)
NAME:  Scott [name], Scott [name] NO.:  [id number]  MEDICAL RECORD NO.:  [id number]  LOCATION:                                 FACILITY:  PHYSICIAN:  Ivin Poot, M.D.  DATE OF BIRTH:  Nov 02, 1945  DATE OF PROCEDURE:  10/05/2014 DATE OF DISCHARGE:                              OPERATIVE REPORT   OPERATION:  Redo sternotomy, reexploration of chest following left ventricular assist device implantation.  SURGEON:  Ivin Poot, M.D.  ASSISTANT:  Lars Pinks, PA-C  PREOPERATIVE DIAGNOSES:  Left hemothorax, cardiac tamponade physiology from delayed bleeding after HeartMate II implantation.  POSTOPERATIVE DIAGNOSES:  Left hemothorax, cardiac tamponade physiology from delayed bleeding after HeartMate II implantation.  ANESTHESIA:  General by Dr. Roberts Gaudy.  INDICATION:  The patient is a 69 year old male with history of ischemic cardiomyopathy, 2 weeks status post implantation of HeartMate II implantation for destination therapy for advanced heart failure and ischemic cardiomyopathy.  He had been doing well and was approaching discharge when he developed decreased blood pressure, drop in hemoglobin from 8.2 down to 7.2, decreased filling of the LVAD, and chest x-ray showing new opacification of left hemithorax from his x-ray 48 hours previously.  The patient was moved to the ICU.  A chest tube was placed in an attempt to drain the presumed hemothorax.  An echocardiogram demonstrated a significant change from the study 24 hours earlier and that the LV volume was significantly reduced, probably from fluid shift from the intrathoracic hemothorax causing tamponade physiology.  I examined the patient and discussed the situation with his cardiologist at the bedside and scheduled him for emergency re-exploration for bleeding and to relieve the tamponade.  I discussed the procedure with the patient's wife, and informed consent was obtained.  OPERATIVE FINDINGS: 1.  Large amounts of clotted and non-clotted blood in the left     hemithorax, the upper abdominal preperitoneal pocket of the LVAD     pump, and along the lateral aspect of the LV. 2. Diffuse raw oozing surfaces from previous post MI pericarditis     adhesions. 3. No active bleeding from the HeartMate II pump cannulation sites to     the LV apex or ascending aorta.  PROCEDURE:  The patient was brought directly from the ICU to the operating room, where general anesthesia was induced.  His mean arterial blood pressure was 50.  His CVP was elevated over 20.  After he was asleep, he underwent placement of pulmonary artery Swan-Ganz catheter and then was prepped and draped as a sterile field.  The patient had a proper time-out performed.  The sternotomy was opened and sternal wires removed.  There was a minimal amount of clotted and non-clotted blood on the right ventricle.  However, elevating the left side of the sternum, it was apparent there was a large collection of clotted and non-clotted blood flow from the left pleural space, and the preperitoneal pump pocket, and on the lateral wall of the left ventricle.  This was carefully and exhaustively removed and drained.  The inflow cannulation connection to the LV apex appeared to be dry without evidence of bleeding.  The outflow graft connection to the ascending aorta was hemostatic  without bleeding.  The pleural space in the pericardium had raw areas from previous adhesions taken down to implant the pump 2 weeks ago.  There was no right pleural effusion.  After evacuation, the clotted and non-clotted blood and starting low- dose inotropes, the RV function improved, VAD flow improved, VAD filling or PI index improved, and cardiac index improved significantly as did the urine output.  A meticulous inspection of the operative field was taken, and there was no active bleeding other than these generalized raw surfaces with some oozing.  New  drains were placed in the anterior and posterior mediastinal pericardial space, both pleural spaces, and underneath the pump in the pump pocket and brought out through separate incisions and was secured to the skin.  The wound was irrigated with copious amounts of warm antibiotic irrigation.  When hemostasis was adequate, the sternum was closed with interrupted steel wire.  The hemodynamics remained stable.  Echocardiogram showed improved LV volumes and improved RV function.  The pectoralis fascia was closed with interrupted #1 Vicryl.  The subcutaneous layer was closed in running Vicryl and the skin was closed with interrupted skin staples.  A femoral venous sheath was placed for IV access if needed.  The driveline exit site was cleaned and a sterile dressing was applied.  Sterile dressings were placed over the incision and chest tube exit sites, and the patient was transferred back to the ICU in critical, but stable condition.     Ivin Poot, M.D.     PV/MEDQ  D:  10/05/2014  T:  10/06/2014  Job:  [id number]

## 2014-10-06 NOTE — Transfer of Care (Signed)
Immediate Anesthesia Transfer of Care Note  Patient: Scott [name]  Procedure(s) Performed: Procedure(s): VIDEO ASSISTED THORACOSCOPY (VATS)/THOROCOTOMY (Left)  Patient Location: SICU  Anesthesia Type:General  Level of Consciousness: sedated  Airway & Oxygen Therapy: Patient remains intubated per anesthesia plan and Patient placed on Ventilator (see vital sign flow sheet for setting)  Post-op Assessment: Report given to RN and Post -op Vital signs reviewed and stable  Post vital signs: Reviewed and stable  Last Vitals:  Filed Vitals:   10/06/14 0925  Pulse: 91  Temp: 36.3 C  Resp: 12    Complications: No apparent anesthesia complications

## 2014-10-06 NOTE — Progress Notes (Signed)
Notiifed Dr. [name] of decreasing UOP (20-30/hr), MAP 60s (max dose levophed), PI 2.3-2.5, CVP 8-10, PAD 11-14.  Current H/H 8.5/24.6.  Orders received to decrease PEEP to 5, give one unit PRBC, get co-ox after blood, stat CXR, increase epi to 4 and max dose of levo now 25.  Will make changes and continue to monitor.  Vista Lawman, RN

## 2014-10-06 NOTE — Progress Notes (Signed)
Notifed Dr. [name] of dropping MAPs, now 58 on 25 levo, VAD numbers as follows: flow 3.4-3.6, PI 2.2-2.3, power 4.6.  Ordered to increase levo to 30 and epi to 5.  Changes made, will continue to monitor.  Vista Lawman, RN

## 2014-10-06 NOTE — Op Note (Signed)
Procedure-placement left chest tube, 32 French  Preoperative and postoperative diagnosis-left pleural effusion, hemothorax  Surgeon-Yeriel Mineo [name] M.D.  Anesthesia-local 1% lidocaine and IV monitored sedation  Operative procedure  The patient was reexplored 14 hours earlier for left hemothorax 2 weeks status post LVAD implantation and was found to have diffuse coagulopathy. The patient had a chest x-ray after chest tubes showed evidence of clotting. This showed a new left pleural effusion. The chest tubes were declotted minimal drainage. A left chest tube was placed in the left anterior axillary line fifth interspace with a 1 inch incision under lidocaine 1% infiltrated into the skin and soft tissue and intercostal muscle.  A 32 French trocar chest tube was then placed posteriorly and directed superiorly into the thorax with drainage of dark blood. This was connected to a underwater seal Pleur-evac drainage system and was sutured to the skin with silk suture. A sterile dressing was applied. A chest x-ray taken showed the chest to be in good position with improvement in the left pleural effusion.  Tharon Aquas trigt M.D.

## 2014-10-06 NOTE — Progress Notes (Addendum)
PI dropping 2.1-2.6, MAPs 50s-60s.  Dr. [name] to bedside; also notified of blue right pointer finger and bilateral great toese.  Orders given for blood and platelets.  Vista Lawman, RN

## 2014-10-06 NOTE — Progress Notes (Addendum)
Patient ID: Scott [name], male   DOB: 18-Sep-1945, 69 y.o.   MRN: [id number] HeartMate 2 Rounding Note  Subjective:    Post Day 14 - HMII LVAD   Moved to ICU  4/5 due to low MAP and low hemoglobin. Taken Back to the OR for recurrent bleed and evacuation of clots from left chest. Coumadin and aspirin stopped.  Early this am he taken back to the OR for recurrent bleed in left chest. Currently on milrinone at 0.25 mcg, epi at 5 mcg, and levo at 16 mcg.   LVAD INTERROGATION:  HeartMate II LVAD:  Flow 4.95 liters/min, speed 9200 power 5.4 , PI 4.4     Objective:    Vital Signs:   Temp:  [96.3 F (35.7 C)-100.6 F (38.1 C)] 98.2 F (36.8 C) (04/06 1215) Pulse Rate:  [25-131] 113 (04/06 1215) Resp:  [10-33] 16 (04/06 1215) BP: (113)/(75) 113/75 mmHg (04/06 0925) SpO2:  [90 %-100 %] 100 % (04/06 1215) Arterial Line BP: (58-100)/(55-84) 81/75 mmHg (04/06 1215) FiO2 (%):  [50 %-100 %] 50 % (04/06 1105) Last BM Date: 10/04/14 Mean arterial Pressure  70-80s  Intake/Output:   Intake/Output Summary (Last 24 hours) at 10/06/14 1218 Last data filed at 10/06/14 1200  Gross per 24 hour  Intake 14336.38 ml  Output   5955 ml  Net 8381.38 ml     Physical Exam: General: Intubated. Pale  HEENT: normal Neck: supple. Carotids 2+ bilat; no bruits. No lymphadenopathy or thryomegaly appreciated. R neck Swan  Cor: Mechanical heart sounds with LVAD hum present. 7 chest tubes in place Lungs: clear Abdomen: soft, nontender, nondistended. No hepatosplenomegaly. No bruits or masses. Good bowel sounds. Driveline: Dressing looks good.  Extremities: no cyanosis, clubbing, rash.  No edema.  Neuro: Sedated on vent.  GU: foley   Telemetry: ST 110s    Labs: Basic Metabolic Panel:  Recent Labs Lab 09/30/14 1150  10/03/14 0509 10/04/14 0554 10/05/14 0530  10/05/14 2000 10/05/14 2012 10/06/14 0055 10/06/14 0315 10/06/14 0621 10/06/14 0926 10/06/14 0932  NA 140  < > 138 140 133*  < >  --   140 139 139 139 140 141  K 4.5  < > 3.7 3.5 3.9  < >  --  4.2 5.6* 3.8 4.5 4.0 4.0  CL 92*  < > 92* 94* 92*  < >  --  99 97 104  --  104 99  CO2 38*  < > 37* 38* 34*  --   --   --   --  29  --  29  --   GLUCOSE 131*  < > 108* 104* 162*  < >  --  122* 162* 71  --  79 79  BUN 12  < > 8 7 10   < >  --  14 17 16   --  18 21  CREATININE 0.76  < > 0.69 0.60 0.72  < > 0.70 0.70 0.90 0.88  --  1.03 0.90  CALCIUM 8.7  < > 8.1* 8.0* 8.2*  --   --   --   --  9.1  --  8.7  --   MG 2.1  --   --   --   --   --  1.6  --   --  1.5  --   --   --   < > = values in this interval not displayed.  Liver Function Tests:  Recent Labs Lab 10/01/14 0401 10/03/14 0509 10/06/14 0315  10/06/14 0926  AST 51* 50* 26 31  ALT 59* 53 17 19  ALKPHOS 266* 216* 71 62  BILITOT 1.0 0.9 2.7* 3.9*  PROT 6.9 5.9* 4.8* 4.5*  ALBUMIN 2.4* 2.1* 3.1* 2.8*   No results for input(s): LIPASE, AMYLASE in the last 168 hours.  Recent Labs Lab 09/30/14 1445  AMMONIA 46*    CBC:  Recent Labs Lab 10/05/14 2220 10/06/14 0050  10/06/14 0315 10/06/14 0240 10/06/14 0719 10/06/14 0926 10/06/14 0932  WBC 22.3* 18.7*  --  21.0*  --  27.5* 25.9*  --   HGB 8.5* 9.6*  < > 7.3* 9.5* 9.2* 9.9* 9.9*  HCT 24.6* 28.7*  < > 21.0* 28.0* 27.0* 28.4* 29.0*  MCV 86.3 88.0  --  86.4  --  87.4 85.3  --   PLT 270 208  --  225  --  214 159  --   < > = values in this interval not displayed.  INR:  Recent Labs Lab 10/05/14 1205 10/05/14 1700 10/05/14 2000 10/06/14 0719 10/06/14 0930  INR 1.78* 2.02* 1.04 1.10 1.26    Other results:    Imaging: Dg Chest Portable 1 View  10/06/2014   CLINICAL DATA:  Pneumothorax post open heart surgery  EXAM: PORTABLE CHEST - 1 VIEW  COMPARISON:  Portable exam 0843 hours compared to 0300 hours  FINDINGS: Tip of endotracheal tube projects 4.3 cm above carina.  Nasogastric tube extends into stomach.  RIGHT jugular Swan-Ganz catheter tip projects over main pulmonary artery.  BILATERAL thoracostomy  tubes and mediastinal drain.  RIGHT arm PICC line tip projects over cavoatrial junction.  LEFT ventricular assist device noted.  Enlargement of cardiac silhouette with pulmonary vascular congestion.  Bibasilar atelectasis more focal on RIGHT unchanged.  Decreased LEFT pleural effusion.  No pneumothorax.  Lateral LEFT chest wall subcutaneous emphysema.  IMPRESSION: Postsurgical changes of median sternotomy and LVAD.  Decreased LEFT pleural effusion.  Bibasilar atelectasis greater on RIGHT.  No pneumothorax.   Electronically Signed   By: Lavonia Dana M.D.   On: 10/06/2014 08:57   Dg Chest Port 1 View  10/06/2014   CLINICAL DATA:  Left-sided chest tube placed.  EXAM: PORTABLE CHEST - 1 VIEW  COMPARISON:  10/05/2014  FINDINGS: Postoperative changes in the mediastinum. Endotracheal tube, enteric tube, Swan-Ganz catheter, right chest tube, right PICC line, and left chest tube unchanged in position. Interval placement of a second left chest tube. Small left pleural effusion is demonstrated. Infiltration or consolidation throughout the left lung and focally in the right lung again demonstrated. No visible pneumothorax.  IMPRESSION: Interval placement of left chest tube. Other appliances unchanged in position. Persistent consolidation in the left lung and focally in the right lung with left pleural fluid demonstrated.   Electronically Signed   By: Lucienne Capers M.D.   On: 10/06/2014 03:19   Dg Chest Port 1 View  10/05/2014   CLINICAL DATA:  Bleeding in chest.  EXAM: PORTABLE CHEST - 1 VIEW  COMPARISON:  10/05/2014  FINDINGS: Endotracheal tube with tip measuring 4.2 cm above the carina. Swan-Ganz catheter with tip over the pulmonary outflow tract. Right PICC catheter with tip over the cavoatrial junction. Bilateral chest tubes. Enteric tube tip is off the field of view but below the left hemidiaphragm. Mediastinal drains.  Shallow inspiration. Diffuse cardiac enlargement. Increasing opacity in the left lung could  represent consolidation or hematoma, given the history of bleeding. Probable small left pleural effusion. Focal consolidation in the right lung base  is unchanged.  IMPRESSION: Increasing opacity in the left lung and developing left pleural effusion. Appliances appear in satisfactory position. Right lung base consolidation unchanged.   Electronically Signed   By: Lucienne Capers M.D.   On: 10/05/2014 23:59   Dg Chest Port 1 View  10/05/2014   CLINICAL DATA:  Left ventricular assist device complication.  EXAM: PORTABLE CHEST - 1 VIEW  COMPARISON:  Same day.  FINDINGS: Stable cardiomegaly. Interval placement of endotracheal tube with distal tip 5 cm above the carina. Left-sided chest tube is noted with tip in left lung apex without evidence of pneumothorax. Loculated effusion seen in left upper lobe laterally is significantly decreased status post tube placement. Right-sided chest tube is noted with tip in apex without evidence of pneumothorax. Right-sided PICC line is noted with distal tip at the expected position of the cavoatrial junction. Left ventricular assist device is again noted and unchanged in position. Lateral right basilar opacity is noted concerning for pneumonia or subsegmental atelectasis. Stable left basilar opacity is noted. Right internal jugular Swan-Ganz catheter is noted with tip directed into right pulmonary artery. Nasogastric tube tip is seen in proximal stomach.  IMPRESSION: Endotracheal tube in grossly good position. Interval placement of bilateral chest tubes without evidence of pneumothorax. Loculated effusion seen along lateral wall of left upper lobe on prior exam is significantly smaller. Left ventricular assist device is unchanged in position.   Electronically Signed   By: Marijo Conception, M.D.   On: 10/05/2014 15:17   Dg Chest Port 1 View  10/05/2014   CLINICAL DATA:  Chest tube in place  EXAM: PORTABLE CHEST - 1 VIEW  COMPARISON:  10/05/2014  FINDINGS: Cardiomegaly again noted.  Right PICC line with tip in right atrium again noted. Stable loculated right lower lateral pleural effusion. Left ventricular assist device is unchanged in position. Left chest tube in place. Stable left retrocardiac opacity. No pneumothorax. Stable loculated left upper pleural effusion. No pneumothorax.  IMPRESSION: Right PICC line with tip in right atrium again noted. Stable loculated right lower lateral pleural effusion. Left ventricular assist device is unchanged in position. Left chest tube in place. Stable left retrocardiac opacity. No pneumothorax. Stable loculated left upper pleural effusion. No pneumothorax.   Electronically Signed   By: Lahoma Crocker M.D.   On: 10/05/2014 09:25   Dg Chest Port 1 View  10/05/2014   CLINICAL DATA:  Left ventricular assist device  EXAM: PORTABLE CHEST - 1 VIEW  COMPARISON:  10/03/2014  FINDINGS: Inflow tract from the LVAD visible and unchanged imposition.  Prominent enlargement of the cardiopericardial silhouette worsening and large left pleural effusion with very little aerated lung in the left hemithorax currently.  Stable loculated right pleural effusion along the lung base.  Right-sided PICC line tip: Central right atrium.  IMPRESSION: 1. Significantly enlarging left pleural effusion with very little aerated lung on the left remaining. 2. Persistent loculated right basilar effusion. 3. Persistent enlargement of the cardiopericardial silhouette.   Electronically Signed   By: Van Clines M.D.   On: 10/05/2014 07:56     Medications:     Scheduled Medications: . sodium chloride   Intravenous Once  . sodium chloride   Intravenous Once  . sodium chloride   Intravenous Once  . sodium chloride   Intravenous Once  . sodium chloride   Intravenous Once  . acetaminophen  1,000 mg Oral 4 times per day   Or  . acetaminophen (TYLENOL) oral liquid 160 mg/5 mL  1,000 mg Per Tube 4 times per day  . acetaminophen (TYLENOL) oral liquid 160 mg/5 mL  650 mg Per Tube Once    Or  . acetaminophen  650 mg Rectal Once  . budesonide-formoterol  2 puff Inhalation BID  . cefUROXime (ZINACEF)  IV  1.5 g Intravenous Q12H  . [MAR Hold] fluconazole (DIFLUCAN) IV  400 mg Intravenous Once  . insulin regular  0-10 Units Intravenous TID WC  . magnesium sulfate  4 g Intravenous Once  . pantoprazole (PROTONIX) IV  40 mg Intravenous q morning - 10a  . [MAR Hold] rifampin  600 mg Oral Once  . sodium chloride  10-40 mL Intracatheter Q12H  . sodium chloride  3 mL Intravenous Q12H  . traZODone  50 mg Oral QHS  . vancomycin  1,000 mg Intravenous Q12H    Infusions: . sodium chloride 20 mL/hr (10/05/14 1500)  . dexmedetomidine 0.7 mcg/kg/hr (10/06/14 1100)  . epinephrine 5 mcg/min (10/06/14 0953)  . insulin (NOVOLIN-R) infusion 0.6 Units/hr (10/06/14 1100)  . lactated ringers 20 mL/hr at 10/06/14 0543  . lactated ringers 20 mL/hr (10/05/14 1500)  . milrinone 0.25 mcg/kg/min (10/06/14 0910)  . norepinephrine (LEVOPHED) Adult infusion 16 mcg/min (10/06/14 1130)    PRN Medications: sodium chloride, albumin human, fentaNYL, levalbuterol, midazolam, ondansetron (ZOFRAN) IV, oxyCODONE, sodium chloride, sodium chloride, sodium chloride, traMADol   Assessment:    1.  A/C systolic HF class IV with cardiogenic shock- EF 15% with moderate RV dysfunction 3/16 echo.         -S/P HMII LVAD 09/21/14  2. CAD s/p Anterior STEMI on 06/12/14 with stenting of LAD- was on coumadin + plavix, statin.  Now holding coumadin, off Plavix.  3. Acute on chronic hypercarbic respiratory failure  4. 06/12/14 VT in setting of STEMI 5. Ankylosing spondylitis. 6. Bilateral PE with R DVT- S/P IVC filter 08/27/2014--->coumadin 7. Acute Blood Loss-  Back to OR 4/5 and 4/6 for recurrent bleed. Received multiple blood products.  Plan/Discussion:    POD #14 HMII LVAD.   Back to OR 4/5 and and again 4/6 this morning for recurrent bleed and evacuation clots. Received multiple blood products. Hemoglobin ok  9.9.  PI improved now that he has had re-evacuation.  Has 7 chest tubes.   Sinus Tach. With history of VT will restart amio 200 mg po twice a day via OG tube.   Off anticoagulants due to recurrent bleed.   Will not restart Plavix as anterior wall completely necrotic.    I reviewed the LVAD parameters from today, and compared the results to the patient's prior recorded data.  No programming changes were made.  The LVAD is functioning within specified parameters.  The patient performs LVAD self-test daily.  LVAD interrogation was negative for any significant power changes, alarms or PI events/speed drops.  LVAD equipment check completed and is in good working order.  Back-up equipment present.   LVAD education done on emergency procedures and precautions and reviewed exit site care.  Length of Stay: 49  Scott [name],AMY  NP-C  10/06/2014, 12:18 PM  VAD Team --- VAD ISSUES ONLY--- Pager [phone] (7am - 7am)  Advanced Heart Failure Team  Pager [phone] (M-F; 7a - 4p)  Please contact [facility] for night-coverage after hours (4p -7a ) and weekends on [email or web address]  Patient seen with NP, agree with the above note.  Patient is back from OR after re-evacuation of clot from left chest with cardiac compression.  PI is improved.  Hemodynamically stable  on pressors with MAP 70s-80s.  Will be following hemoglobin and chest tube drainage closely.    Long-term anticoagulation will be an issue that remains to be resolved.   Will repeat echo tomorrow if remains stable.   Scott [name] 10/06/2014 12:44 PM

## 2014-10-06 NOTE — CV Procedure (Signed)
Intra-operative Transesophageal Echocardiography Report:  [name] is a 69 year old male with severe ischemia ischemic cardiomyopathy who  developed worsening systolic heart failure and cardiogenic shock with an ejection fraction of 20%. He underwent insertion of the HeartMate 2 left ventricular assist device on 09/21/2014. He subsequently underwent bleeding into the mediastinum and left pleural space and was returned to the operating room yesterday for redo sternotomy and evacuation of hematoma. Following surgery yesterday, he returned to the ICU and over the next 12 hours develop progressive hypotension and evidence of continued bleeding into the chest. This morning he was brought back to the operating room by Dr. Lucianne Lei trigt for a left thoracotomy for evacuation of hematoma and exploration of the chest.  The patient was brought to the operating room with an endotracheal tube in place and general anesthesia was induced. The endotracheal tube was then exchanged for a double lumen endotracheal tube. The transesophageal echocardiography probe was inserted by Dr. Annye Asa.  Impression:  1. Aortic valve: The aortic valve was trileaflet. During the course of the procedure the aortic valve could not be seen to open. There was a central jet of aortic insufficiency which was directed towards the undersurface of the anterior leaflet of the mitral valve this was graded as 2+.  2. Mitral valve: There was 1+ to trace mitral insufficiency.  3. Left ventricle: Upon initial evaluation  the left ventricle was severely underfilled with near collapse of the left ventricular cavity. With evacuation of the hematoma and volume resuscitation, the left ventricular volume markedly improved. There was global left ventricular hypokinesis. The inflow cannula of the left ventricular assist device was examined. The device was located at the apex but was oriented towards the interventricular septum but not  abutting the septum. There was no turbulent flow noted within the inflow of the cannula.  4. Right ventricle: Upon initial evaluation the right ventricular function was difficult to assess due to distortion of the left ventricular morphology. Following volume resuscitation and evacuation of the intrathoracic hematoma, the right ventricular function could be assessed and there was noted to be mild to moderate Right ventricular dysfunction with reduced contractility of the RV free wall.  5. Tricuspid valve: Tricuspid valve was examined following volume resuscitation. There was 1+ tricuspid insufficiency.  6. Interatrial septum: Interatrial septum was examined using color Doppler and there was no evidence of patent foramen ovale or atrial septal defect appreciated by color Doppler.  7. Left atrium: Left atrial cavity was examined and there was no evidence of thrombus within the left atrial cavity or left atrial appendage.  8. Pleural space there was extensive hematoma noted within the left pleural space adjacent to the descending aorta. The volume of the L. Pleural hematoma was reduced following evacuation.   9. Pericardial space: There was no intra-pericardial fluid noted.  Roberts Gaudy, MD

## 2014-10-06 NOTE — Progress Notes (Signed)
CSW met with wife at bedside. Wife shared that patient went back to the OR this morning for continued bleeding issue. Patient currently on the vent and was able to open eyes and appeared to attempt communication with his wife. Wife appeared very emotional but stated that she was "coping ok". CSW provided supportive intervention and will continue to follow. Jackie Brennan, LCSW [phone] 

## 2014-10-07 ENCOUNTER — Inpatient Hospital Stay (HOSPITAL_COMMUNITY): Payer: Medicare Other | Admitting: Anesthesiology

## 2014-10-07 ENCOUNTER — Encounter (HOSPITAL_COMMUNITY): Payer: Self-pay | Admitting: Cardiothoracic Surgery

## 2014-10-07 ENCOUNTER — Inpatient Hospital Stay (HOSPITAL_COMMUNITY): Payer: Medicare Other

## 2014-10-07 ENCOUNTER — Encounter: Payer: Self-pay | Admitting: Licensed Clinical Social Worker

## 2014-10-07 DIAGNOSIS — Z95811 Presence of heart assist device: Secondary | ICD-10-CM

## 2014-10-07 DIAGNOSIS — I5023 Acute on chronic systolic (congestive) heart failure: Secondary | ICD-10-CM

## 2014-10-07 DIAGNOSIS — I350 Nonrheumatic aortic (valve) stenosis: Secondary | ICD-10-CM

## 2014-10-07 LAB — CBC
HCT: 25.6 % — ABNORMAL LOW (ref 39.0–52.0)
HCT: 25.2 % — ABNORMAL LOW (ref 39.0–52.0)
Hemoglobin: 8.9 g/dL — ABNORMAL LOW (ref 13.0–17.0)
Hemoglobin: 8.9 g/dL — ABNORMAL LOW (ref 13.0–17.0)
MCH: 29.4 pg (ref 26.0–34.0)
MCH: 30.1 pg (ref 26.0–34.0)
MCHC: 34.8 g/dL (ref 30.0–36.0)
MCHC: 35.3 g/dL (ref 30.0–36.0)
MCV: 84.5 fL (ref 78.0–100.0)
MCV: 85.1 fL (ref 78.0–100.0)
Platelets: 214 10*3/uL (ref 150–400)
Platelets: 193 10*3/uL (ref 150–400)
RBC: 3.03 MIL/uL — ABNORMAL LOW (ref 4.22–5.81)
RBC: 2.96 MIL/uL — ABNORMAL LOW (ref 4.22–5.81)
RDW: 15 % (ref 11.5–15.5)
RDW: 15.4 % (ref 11.5–15.5)
WBC: 26.1 10*3/uL — ABNORMAL HIGH (ref 4.0–10.5)
WBC: 23 10*3/uL — ABNORMAL HIGH (ref 4.0–10.5)

## 2014-10-07 LAB — CBC WITH DIFFERENTIAL/PLATELET
Basophils Absolute: 0 10*3/uL (ref 0.0–0.1)
Basophils Relative: 0 % (ref 0–1)
EOS PCT: 0 % (ref 0–5)
Eosinophils Absolute: 0 10*3/uL (ref 0.0–0.7)
HEMATOCRIT: 24 % — AB (ref 39.0–52.0)
Hemoglobin: 8.5 g/dL — ABNORMAL LOW (ref 13.0–17.0)
LYMPHS PCT: 3 % — AB (ref 12–46)
Lymphs Abs: 0.8 10*3/uL (ref 0.7–4.0)
MCH: 30.1 pg (ref 26.0–34.0)
MCHC: 35.4 g/dL (ref 30.0–36.0)
MCV: 85.1 fL (ref 78.0–100.0)
MONOS PCT: 10 % (ref 3–12)
Monocytes Absolute: 2.6 10*3/uL — ABNORMAL HIGH (ref 0.1–1.0)
Neutro Abs: 22.6 10*3/uL — ABNORMAL HIGH (ref 1.7–7.7)
Neutrophils Relative %: 87 % — ABNORMAL HIGH (ref 43–77)
Platelets: 198 10*3/uL (ref 150–400)
RBC: 2.82 MIL/uL — ABNORMAL LOW (ref 4.22–5.81)
RDW: 15.1 % (ref 11.5–15.5)
WBC: 26 10*3/uL — AB (ref 4.0–10.5)

## 2014-10-07 LAB — POCT I-STAT, CHEM 8
BUN: 21 mg/dL (ref 6–23)
CREATININE: 0.8 mg/dL (ref 0.50–1.35)
Calcium, Ion: 1.17 mmol/L (ref 1.13–1.30)
Chloride: 98 mmol/L (ref 96–112)
Glucose, Bld: 101 mg/dL — ABNORMAL HIGH (ref 70–99)
HEMATOCRIT: 26 % — AB (ref 39.0–52.0)
HEMOGLOBIN: 8.8 g/dL — AB (ref 13.0–17.0)
Potassium: 3.4 mmol/L — ABNORMAL LOW (ref 3.5–5.1)
Sodium: 137 mmol/L (ref 135–145)
TCO2: 27 mmol/L (ref 0–100)

## 2014-10-07 LAB — PREPARE CRYOPRECIPITATE
UNIT DIVISION: 0
Unit division: 0

## 2014-10-07 LAB — PREPARE PLATELET PHERESIS
Unit division: 0
Unit division: 0
Unit division: 0

## 2014-10-07 LAB — COMPREHENSIVE METABOLIC PANEL
ALT: 17 U/L (ref 0–53)
AST: 33 U/L (ref 0–37)
Albumin: 2.2 g/dL — ABNORMAL LOW (ref 3.5–5.2)
Alkaline Phosphatase: 64 U/L (ref 39–117)
Anion gap: 11 (ref 5–15)
BUN: 21 mg/dL (ref 6–23)
CO2: 23 mmol/L (ref 19–32)
Calcium: 8 mg/dL — ABNORMAL LOW (ref 8.4–10.5)
Chloride: 104 mmol/L (ref 96–112)
Creatinine, Ser: 0.89 mg/dL (ref 0.50–1.35)
GFR calc Af Amer: >90 mL/min (ref >90)
GFR calc non Af Amer: 87 mL/min — ABNORMAL LOW (ref >90)
Glucose, Bld: 132 mg/dL — ABNORMAL HIGH (ref 70–99)
Potassium: 4.1 mmol/L (ref 3.5–5.1)
Sodium: 138 mmol/L (ref 135–145)
Total Bilirubin: 2.3 mg/dL — ABNORMAL HIGH (ref 0.3–1.2)
Total Protein: 4.1 g/dL — ABNORMAL LOW (ref 6.0–8.3)

## 2014-10-07 LAB — BLOOD GAS, ARTERIAL
Acid-Base Excess: 4.3 mmol/L — ABNORMAL HIGH (ref 0.0–2.0)
Bicarbonate: 27.8 mEq/L — ABNORMAL HIGH (ref 20.0–24.0)
FIO2: 0.5 %
MECHVT: 600 mL
O2 Saturation: 98.9 %
PEEP: 5 cmH2O
Patient temperature: 98.6
Pressure support: 10 cmH2O
RATE: 16 resp/min
TCO2: 29 mmol/L (ref 0–100)
pCO2 arterial: 38 mmHg (ref 35.0–45.0)
pH, Arterial: 7.478 — ABNORMAL HIGH (ref 7.350–7.450)
pO2, Arterial: 110 mmHg — ABNORMAL HIGH (ref 80.0–100.0)

## 2014-10-07 LAB — POCT I-STAT 3, ART BLOOD GAS (G3+)
Acid-Base Excess: 2 mmol/L (ref 0.0–2.0)
Acid-Base Excess: 8 mmol/L — ABNORMAL HIGH (ref 0.0–2.0)
Bicarbonate: 29.3 mEq/L — ABNORMAL HIGH (ref 20.0–24.0)
Bicarbonate: 31.9 mEq/L — ABNORMAL HIGH (ref 20.0–24.0)
O2 SAT: 99 %
O2 Saturation: 99 %
PCO2 ART: 61.4 mmHg — AB (ref 35.0–45.0)
PH ART: 7.291 — AB (ref 7.350–7.450)
PH ART: 7.526 — AB (ref 7.350–7.450)
PO2 ART: 123 mmHg — AB (ref 80.0–100.0)
Patient temperature: 37.7
TCO2: 31 mmol/L (ref 0–100)
TCO2: 33 mmol/L (ref 0–100)
pCO2 arterial: 38.5 mmHg (ref 35.0–45.0)
pO2, Arterial: 145 mmHg — ABNORMAL HIGH (ref 80.0–100.0)

## 2014-10-07 LAB — GLUCOSE, CAPILLARY
GLUCOSE-CAPILLARY: 123 mg/dL — AB (ref 70–99)
GLUCOSE-CAPILLARY: 126 mg/dL — AB (ref 70–99)
GLUCOSE-CAPILLARY: 126 mg/dL — AB (ref 70–99)
GLUCOSE-CAPILLARY: 131 mg/dL — AB (ref 70–99)
GLUCOSE-CAPILLARY: 136 mg/dL — AB (ref 70–99)
GLUCOSE-CAPILLARY: 99 mg/dL (ref 70–99)
Glucose-Capillary: 133 mg/dL — ABNORMAL HIGH (ref 70–99)
Glucose-Capillary: 119 mg/dL — ABNORMAL HIGH (ref 70–99)
Glucose-Capillary: 82 mg/dL (ref 70–99)
Glucose-Capillary: 127 mg/dL — ABNORMAL HIGH (ref 70–99)
Glucose-Capillary: 144 mg/dL — ABNORMAL HIGH (ref 70–99)
Glucose-Capillary: 131 mg/dL — ABNORMAL HIGH (ref 70–99)
Glucose-Capillary: 130 mg/dL — ABNORMAL HIGH (ref 70–99)
Glucose-Capillary: 153 mg/dL — ABNORMAL HIGH (ref 70–99)
Glucose-Capillary: 135 mg/dL — ABNORMAL HIGH (ref 70–99)
Glucose-Capillary: 118 mg/dL — ABNORMAL HIGH (ref 70–99)
Glucose-Capillary: 104 mg/dL — ABNORMAL HIGH (ref 70–99)
Glucose-Capillary: 117 mg/dL — ABNORMAL HIGH (ref 70–99)
Glucose-Capillary: 110 mg/dL — ABNORMAL HIGH (ref 70–99)
Glucose-Capillary: 96 mg/dL (ref 70–99)

## 2014-10-07 LAB — PROTIME-INR
INR: 1.47 (ref 0.00–1.49)
PROTHROMBIN TIME: 18 s — AB (ref 11.6–15.2)

## 2014-10-07 LAB — LACTATE DEHYDROGENASE: LDH: 275 U/L — ABNORMAL HIGH (ref 94–250)

## 2014-10-07 LAB — CARBOXYHEMOGLOBIN
Carboxyhemoglobin: 1.7 % — ABNORMAL HIGH (ref 0.5–1.5)
Methemoglobin: 1.5 % (ref 0.0–1.5)
O2 SAT: 65.1 %
TOTAL HEMOGLOBIN: 8.5 g/dL — AB (ref 13.5–18.0)

## 2014-10-07 LAB — BLOOD PRODUCT ORDER (VERBAL) VERIFICATION

## 2014-10-07 LAB — PREPARE RBC (CROSSMATCH)

## 2014-10-07 LAB — MAGNESIUM: Magnesium: 1.3 mg/dL — ABNORMAL LOW (ref 1.5–2.5)

## 2014-10-07 MED ORDER — ETOMIDATE 2 MG/ML IV SOLN
INTRAVENOUS | Status: DC | PRN
  Administered 2014-10-07: 20 mg via INTRAVENOUS

## 2014-10-07 MED ORDER — FENTANYL CITRATE 0.05 MG/ML IJ SOLN
50.0000 ug | INTRAMUSCULAR | Status: DC | PRN
  Administered 2014-10-09: 50 ug via INTRAVENOUS
  Administered 2014-10-10: 25 ug via INTRAVENOUS
  Filled 2014-10-07: qty 2

## 2014-10-07 MED ORDER — LACTATED RINGERS IV SOLN
INTRAVENOUS | Status: DC
  Administered 2014-10-07 – 2014-10-09 (×2): via INTRAVENOUS
  Administered 2014-10-14: 20 mL/h via INTRAVENOUS
  Administered 2014-10-19 – 2014-10-22 (×3): via INTRAVENOUS

## 2014-10-07 MED ORDER — AMIODARONE HCL IN DEXTROSE 360-4.14 MG/200ML-% IV SOLN
30.0000 mg/h | INTRAVENOUS | Status: DC
  Administered 2014-10-07 – 2014-10-08 (×4): 30 mg/h via INTRAVENOUS
  Filled 2014-10-07 (×5): qty 200

## 2014-10-07 MED ORDER — INSULIN GLARGINE 100 UNIT/ML ~~LOC~~ SOLN
10.0000 [IU] | Freq: Two times a day (BID) | SUBCUTANEOUS | Status: DC
Start: 2014-10-07 — End: 2014-10-11
  Administered 2014-10-07 – 2014-10-10 (×7): 10 [IU] via SUBCUTANEOUS
  Filled 2014-10-07 (×10): qty 0.1

## 2014-10-07 MED ORDER — SUCCINYLCHOLINE CHLORIDE 20 MG/ML IJ SOLN
INTRAMUSCULAR | Status: DC | PRN
  Administered 2014-10-07: 120 mg via INTRAVENOUS

## 2014-10-07 MED ORDER — SODIUM CHLORIDE 0.9 % IV SOLN
Freq: Once | INTRAVENOUS | Status: AC
  Administered 2014-10-07: 25 mL via INTRAVENOUS

## 2014-10-07 MED ORDER — INSULIN DETEMIR 100 UNIT/ML ~~LOC~~ SOLN: 10.0000 [IU] | Freq: Two times a day (BID) | SUBCUTANEOUS | Status: DC

## 2014-10-07 MED ORDER — SODIUM CHLORIDE 0.9 % IJ SOLN
10.0000 mL | Freq: Two times a day (BID) | INTRAMUSCULAR | Status: DC
  Administered 2014-10-07 – 2014-10-25 (×24): 10 mL via INTRAVENOUS

## 2014-10-07 MED ORDER — SODIUM CHLORIDE 0.9 % IV SOLN
50.0000 ug/h | INTRAVENOUS | Status: DC
  Administered 2014-10-07 – 2014-10-08 (×2): 50 ug/h via INTRAVENOUS
  Filled 2014-10-07 (×3): qty 50

## 2014-10-07 MED ORDER — POTASSIUM CHLORIDE 10 MEQ/50ML IV SOLN
10.0000 meq | INTRAVENOUS | Status: AC | PRN
  Administered 2014-10-07 – 2014-10-08 (×3): 10 meq via INTRAVENOUS
  Filled 2014-10-07: qty 50

## 2014-10-07 MED ORDER — METOCLOPRAMIDE HCL 5 MG/ML IJ SOLN
10.0000 mg | Freq: Four times a day (QID) | INTRAMUSCULAR | Status: DC
  Administered 2014-10-07 – 2014-10-13 (×26): 10 mg via INTRAVENOUS
  Filled 2014-10-07 (×29): qty 2

## 2014-10-07 MED ORDER — MAGNESIUM SULFATE 4 GM/100ML IV SOLN
4.0000 g | Freq: Once | INTRAVENOUS | Status: AC
  Administered 2014-10-07: 4 g via INTRAVENOUS
  Filled 2014-10-07: qty 100

## 2014-10-07 MED ORDER — VITAL 1.5 CAL PO LIQD
1000.0000 mL | ORAL | Status: DC
Start: 2014-10-07 — End: 2014-10-10
  Administered 2014-10-07 – 2014-10-09 (×3): 1000 mL
  Filled 2014-10-07 (×4): qty 1000

## 2014-10-07 MED ORDER — POTASSIUM CHLORIDE 10 MEQ/50ML IV SOLN
10.0000 meq | INTRAVENOUS | Status: AC
  Administered 2014-10-07 (×2): 10 meq via INTRAVENOUS

## 2014-10-07 MED ORDER — ACETAMINOPHEN 160 MG/5ML PO SOLN: 650.0000 mg | Freq: Once | ORAL | Status: AC

## 2014-10-07 MED ORDER — POTASSIUM CHLORIDE 10 MEQ/50ML IV SOLN
INTRAVENOUS | Status: AC
  Administered 2014-10-07: 10 meq
  Filled 2014-10-07: qty 50

## 2014-10-07 MED ORDER — ACETAMINOPHEN 650 MG RE SUPP: 650.0000 mg | Freq: Once | RECTAL | Status: AC

## 2014-10-07 MED ORDER — INSULIN ASPART 100 UNIT/ML ~~LOC~~ SOLN
0.0000 [IU] | SUBCUTANEOUS | Status: DC
  Administered 2014-10-07 – 2014-10-09 (×7): 2 [IU] via SUBCUTANEOUS
  Administered 2014-10-09: 4 [IU] via SUBCUTANEOUS
  Administered 2014-10-09 – 2014-10-13 (×8): 2 [IU] via SUBCUTANEOUS
  Administered 2014-10-13: 4 [IU] via SUBCUTANEOUS

## 2014-10-07 MED ORDER — POTASSIUM CHLORIDE 10 MEQ/50ML IV SOLN: 10.0000 meq | INTRAVENOUS | Status: DC

## 2014-10-07 MED ORDER — AMIODARONE LOAD VIA INFUSION
150.0000 mg | Freq: Once | INTRAVENOUS | Status: DC
  Filled 2014-10-07: qty 83.34

## 2014-10-07 MED ORDER — FUROSEMIDE 10 MG/ML IJ SOLN
20.0000 mg | Freq: Two times a day (BID) | INTRAMUSCULAR | Status: DC
  Administered 2014-10-07 (×2): 20 mg via INTRAVENOUS
  Filled 2014-10-07 (×5): qty 2

## 2014-10-07 MED ORDER — AMIODARONE HCL IN DEXTROSE 360-4.14 MG/200ML-% IV SOLN
30.0000 mg/h | INTRAVENOUS | Status: DC
  Administered 2014-10-07: 150 mg/h via INTRAVENOUS
  Administered 2014-10-07: 30 mg/h via INTRAVENOUS
  Filled 2014-10-07 (×2): qty 200

## 2014-10-07 MED FILL — Sodium Chloride IV Soln 0.9%: INTRAVENOUS | Qty: 2000 | Status: AC

## 2014-10-07 MED FILL — Heparin Sodium (Porcine) Inj 1000 Unit/ML: INTRAMUSCULAR | Qty: 30 | Status: AC

## 2014-10-07 NOTE — Progress Notes (Signed)
CSW met with wife at bedside. Patient intubated and wife reports that he "pulled out the tube earlier this morning". Wife became tearful and questioning "how much more can he take".  She stated "he hates that tube". CSW provided supportive intervention and encouraged wife to take breaks for herself. CSW will continue to follow for supportive intervention. Raquel Sarna, [place]

## 2014-10-07 NOTE — Progress Notes (Signed)
Patient ID: Scott [name], male   DOB: 1946/04/29, 69 y.o.   MRN: [id number] HeartMate 2 Rounding Note  Subjective:    S/P HMII LVAD on 09/20/2013.   Moved to ICU  4/5 due to low MAP and low hemoglobin. Taken Back to the OR 4/5 and 4/6  for recurrent bleed and evacuation of clots from left chest. Off all anticoagulants.    Currently on milrinone at 0.25 mcg, epi at 5 mcg, and levo at 15 mcg. Remains intubated/sedated.    PAP 28/12  CO/CI 5.0/2.8  LVAD INTERROGATION:  HeartMate II LVAD:  Flow 5.4  liters/min, speed 9200 power 6 , PI 6.2     Objective:    Vital Signs:   Temp:  [97.2 F (36.2 C)-100.8 F (38.2 C)] 98.8 F (37.1 C) (04/07 0800) Pulse Rate:  [89-135] 97 (04/07 0800) Resp:  [10-39] 14 (04/07 0800) BP: (113)/(75) 113/75 mmHg (04/06 0925) SpO2:  [95 %-100 %] 98 % (04/07 0800) Arterial Line BP: (53-108)/(48-84) 107/75 mmHg (04/07 0800) FiO2 (%):  [50 %-100 %] 50 % (04/07 0800) Weight:  [205 lb 4 oz (93.1 kg)] 205 lb 4 oz (93.1 kg) (04/07 0500) Last BM Date: 10/04/14 Mean arterial Pressure  70-80s  Intake/Output:   Intake/Output Summary (Last 24 hours) at 10/07/14 0807 Last data filed at 10/07/14 0700  Gross per 24 hour  Intake 5549.66 ml  Output   3695 ml  Net 1854.66 ml     Physical Exam: CVP 11 General: Intubated. Pale  HEENT: normal Neck: supple. Carotids 2+ bilat; no bruits. No lymphadenopathy or thryomegaly appreciated. R neck Swan  Cor: Mechanical heart sounds with LVAD hum present. 7 chest tubes in place Lungs: clear Abdomen: soft, nontender, nondistended. No hepatosplenomegaly. No bruits or masses. Good bowel sounds. Driveline: Dressing looks good.  Extremities: no cyanosis, clubbing, rash.  No edema.  Neuro: Sedated on vent.  GU: foley   Telemetry: ST 110s    Labs: Basic Metabolic Panel:  Recent Labs Lab 09/30/14 1150  10/05/14 0530  10/05/14 2000  10/06/14 0315  10/06/14 0926 10/06/14 0932 10/06/14 1500 10/06/14 1553  10/07/14 0409  NA 140  < > 133*  < >  --   < > 139  < > 140 141 139 140 138  K 4.5  < > 3.9  < >  --   < > 3.8  < > 4.0 4.0 4.0 4.1 4.1  CL 92*  < > 92*  < >  --   < > 104  --  104 99 103 98 104  CO2 38*  < > 34*  --   --   --  29  --  29  --  29  --  23  GLUCOSE 131*  < > 162*  < >  --   < > 71  --  79 79 103* 100* 132*  BUN 12  < > 10  < >  --   < > 16  --  18 21 21 21 21   CREATININE 0.76  < > 0.72  < > 0.70  < > 0.88  --  1.03 0.90 0.94 0.90 0.89  CALCIUM 8.7  < > 8.2*  --   --   --  9.1  --  8.7  --  8.2*  --  8.0*  MG 2.1  --   --   --  1.6  --  1.5  --   --   --  1.2*  --  1.3*  < > = values in this interval not displayed.  Liver Function Tests:  Recent Labs Lab 10/01/14 0401 10/03/14 0509 10/06/14 0315 10/06/14 0926 10/07/14 0409  AST 51* 50* 26 31 33  ALT 59* 53 17 19 17   ALKPHOS 266* 216* 71 62 64  BILITOT 1.0 0.9 2.7* 3.9* 2.3*  PROT 6.9 5.9* 4.8* 4.5* 4.1*  ALBUMIN 2.4* 2.1* 3.1* 2.8* 2.2*   No results for input(s): LIPASE, AMYLASE in the last 168 hours.  Recent Labs Lab 09/30/14 1445  AMMONIA 46*    CBC:  Recent Labs Lab 10/06/14 0719 10/06/14 0926 10/06/14 0932 10/06/14 1500 10/06/14 1553 10/07/14 0200 10/07/14 0409  WBC 27.5* 25.9*  --  23.5*  --  26.1* 26.0*  NEUTROABS  --   --   --   --   --   --  22.6*  HGB 9.2* 9.9* 9.9* 8.4* 7.8* 8.9* 8.5*  HCT 27.0* 28.4* 29.0* 23.7* 23.0* 25.6* 24.0*  MCV 87.4 85.3  --  83.7  --  84.5 85.1  PLT 214 159  --  161  --  214 198    INR:  Recent Labs Lab 10/05/14 1700 10/05/14 2000 10/06/14 0719 10/06/14 0930 10/07/14 0409  INR 2.02* 1.04 1.10 1.26 1.47    Other results:    Imaging: Dg Chest Port 1 View  10/07/2014   CLINICAL DATA:  Post intubation.  Left ventricular assist device.  EXAM: PORTABLE CHEST - 1 VIEW  COMPARISON:  10/06/2014  FINDINGS: Postoperative changes in the mediastinum. Left ventricular assist device present. Endotracheal tube tip measures 4.8 cm above the carinal. Enteric tube  tip is in the left upper quadrant consistent with location in the upper stomach. Bilateral chest tubes and mediastinal drains are present. Swan-Ganz catheter with tip over the pulmonary outflow tract. Right PICC line with tip over the low SVC region. No pneumothorax. Consolidation in the right lung base is slightly increased since previous study. Blunting of the costophrenic angles suggests small effusions.  IMPRESSION: Appliances are unchanged in satisfactory position. Mild increase of consolidation in the right lung base. Probable small pleural effusions.   Electronically Signed   By: Lucienne Capers M.D.   On: 10/07/2014 03:25   Dg Chest Portable 1 View  10/06/2014   CLINICAL DATA:  Pneumothorax post open heart surgery  EXAM: PORTABLE CHEST - 1 VIEW  COMPARISON:  Portable exam 0843 hours compared to 0300 hours  FINDINGS: Tip of endotracheal tube projects 4.3 cm above carina.  Nasogastric tube extends into stomach.  RIGHT jugular Swan-Ganz catheter tip projects over main pulmonary artery.  BILATERAL thoracostomy tubes and mediastinal drain.  RIGHT arm PICC line tip projects over cavoatrial junction.  LEFT ventricular assist device noted.  Enlargement of cardiac silhouette with pulmonary vascular congestion.  Bibasilar atelectasis more focal on RIGHT unchanged.  Decreased LEFT pleural effusion.  No pneumothorax.  Lateral LEFT chest wall subcutaneous emphysema.  IMPRESSION: Postsurgical changes of median sternotomy and LVAD.  Decreased LEFT pleural effusion.  Bibasilar atelectasis greater on RIGHT.  No pneumothorax.   Electronically Signed   By: Lavonia Dana M.D.   On: 10/06/2014 08:57   Dg Chest Port 1 View  10/06/2014   CLINICAL DATA:  Left-sided chest tube placed.  EXAM: PORTABLE CHEST - 1 VIEW  COMPARISON:  10/05/2014  FINDINGS: Postoperative changes in the mediastinum. Endotracheal tube, enteric tube, Swan-Ganz catheter, right chest tube, right PICC line, and left chest tube unchanged in position. Interval  placement of a  second left chest tube. Small left pleural effusion is demonstrated. Infiltration or consolidation throughout the left lung and focally in the right lung again demonstrated. No visible pneumothorax.  IMPRESSION: Interval placement of left chest tube. Other appliances unchanged in position. Persistent consolidation in the left lung and focally in the right lung with left pleural fluid demonstrated.   Electronically Signed   By: Lucienne Capers M.D.   On: 10/06/2014 03:19   Dg Chest Port 1 View  10/05/2014   CLINICAL DATA:  Bleeding in chest.  EXAM: PORTABLE CHEST - 1 VIEW  COMPARISON:  10/05/2014  FINDINGS: Endotracheal tube with tip measuring 4.2 cm above the carina. Swan-Ganz catheter with tip over the pulmonary outflow tract. Right PICC catheter with tip over the cavoatrial junction. Bilateral chest tubes. Enteric tube tip is off the field of view but below the left hemidiaphragm. Mediastinal drains.  Shallow inspiration. Diffuse cardiac enlargement. Increasing opacity in the left lung could represent consolidation or hematoma, given the history of bleeding. Probable small left pleural effusion. Focal consolidation in the right lung base is unchanged.  IMPRESSION: Increasing opacity in the left lung and developing left pleural effusion. Appliances appear in satisfactory position. Right lung base consolidation unchanged.   Electronically Signed   By: Lucienne Capers M.D.   On: 10/05/2014 23:59   Dg Chest Port 1 View  10/05/2014   CLINICAL DATA:  Left ventricular assist device complication.  EXAM: PORTABLE CHEST - 1 VIEW  COMPARISON:  Same day.  FINDINGS: Stable cardiomegaly. Interval placement of endotracheal tube with distal tip 5 cm above the carina. Left-sided chest tube is noted with tip in left lung apex without evidence of pneumothorax. Loculated effusion seen in left upper lobe laterally is significantly decreased status post tube placement. Right-sided chest tube is noted with tip in  apex without evidence of pneumothorax. Right-sided PICC line is noted with distal tip at the expected position of the cavoatrial junction. Left ventricular assist device is again noted and unchanged in position. Lateral right basilar opacity is noted concerning for pneumonia or subsegmental atelectasis. Stable left basilar opacity is noted. Right internal jugular Swan-Ganz catheter is noted with tip directed into right pulmonary artery. Nasogastric tube tip is seen in proximal stomach.  IMPRESSION: Endotracheal tube in grossly good position. Interval placement of bilateral chest tubes without evidence of pneumothorax. Loculated effusion seen along lateral wall of left upper lobe on prior exam is significantly smaller. Left ventricular assist device is unchanged in position.   Electronically Signed   By: Marijo Conception, M.D.   On: 10/05/2014 15:17   Dg Chest Port 1 View  10/05/2014   CLINICAL DATA:  Chest tube in place  EXAM: PORTABLE CHEST - 1 VIEW  COMPARISON:  10/05/2014  FINDINGS: Cardiomegaly again noted. Right PICC line with tip in right atrium again noted. Stable loculated right lower lateral pleural effusion. Left ventricular assist device is unchanged in position. Left chest tube in place. Stable left retrocardiac opacity. No pneumothorax. Stable loculated left upper pleural effusion. No pneumothorax.  IMPRESSION: Right PICC line with tip in right atrium again noted. Stable loculated right lower lateral pleural effusion. Left ventricular assist device is unchanged in position. Left chest tube in place. Stable left retrocardiac opacity. No pneumothorax. Stable loculated left upper pleural effusion. No pneumothorax.   Electronically Signed   By: Lahoma Crocker M.D.   On: 10/05/2014 09:25     Medications:     Scheduled Medications: . acetaminophen  1,000 mg  Oral 4 times per day   Or  . acetaminophen (TYLENOL) oral liquid 160 mg/5 mL  1,000 mg Per Tube 4 times per day  . acetaminophen (TYLENOL) oral  liquid 160 mg/5 mL  650 mg Per Tube Once   Or  . acetaminophen  650 mg Rectal Once  . amiodarone  200 mg Oral BID  . antiseptic oral rinse  7 mL Mouth Rinse QID  . budesonide-formoterol  2 puff Inhalation BID  . chlorhexidine  15 mL Mouth Rinse BID  . furosemide  20 mg Intravenous BID  . insulin glargine  10 Units Subcutaneous BID  . insulin regular  0-10 Units Intravenous TID WC  . metoCLOPramide (REGLAN) injection  10 mg Intravenous 4 times per day  . pantoprazole (PROTONIX) IV  40 mg Intravenous QHS  . piperacillin-tazobactam (ZOSYN)  IV  3.375 g Intravenous 3 times per day  . potassium chloride  10 mEq Intravenous Q1 Hr x 2  . [MAR Hold] rifampin  600 mg Oral Once  . traZODone  50 mg Oral QHS  . vancomycin  1,000 mg Intravenous Q12H    Infusions: . sodium chloride 10 mL/hr at 10/07/14 0800  . dexmedetomidine 0.699 mcg/kg/hr (10/07/14 0800)  . epinephrine 5.013 mcg/min (10/07/14 0800)  . feeding supplement (VITAL 1.5 CAL)    . insulin (NOVOLIN-R) infusion 2.3 Units/hr (10/07/14 0800)  . lactated ringers 20 mL/hr at 10/06/14 0543  . milrinone 0.25 mcg/kg/min (10/07/14 0800)  . norepinephrine (LEVOPHED) Adult infusion 15.04 mcg/min (10/07/14 0800)    PRN Medications: sodium chloride, fentaNYL, levalbuterol, midazolam, ondansetron (ZOFRAN) IV, oxyCODONE, sodium chloride, traMADol   Assessment:    1.  A/C systolic HF class IV with cardiogenic shock- EF 15% with moderate RV dysfunction 3/16 echo.         -S/P HMII LVAD 09/21/14  2. CAD s/p Anterior STEMI on 06/12/14 with stenting of LAD- was on coumadin + plavix, statin.  Now holding coumadin, off Plavix.  3. Acute on chronic hypercarbic respiratory failure  4. 06/12/14 VT in setting of STEMI 5. Ankylosing spondylitis. 6. Bilateral PE with R DVT- S/P IVC filter 08/27/2014--->coumadin 7. Acute Blood Loss into pleural space:  Back to OR 4/5 and 4/6 for recurrent bleed. Received multiple blood products.  Plan/Discussion:    S/P 09/21/2014 HMII LVAD.   Back to OR 4/5 and and again 4/6 this morning for recurrent bleed and evacuation clots. Received multiple blood products. Hemoglobin 8.5 today.   PI improved now that he has had re-evacuation from pleural space.  Has 7 chest tubes.   Sinus Tach. Has history of VT. Continue amio 200 mg po twice a day via OG tube. Mag 1.3 Give 4 grams Mag now.   Off anticoagulants due to recurrent bleed.   Will not restart Plavix as anterior wall completely necrotic.    I reviewed the LVAD parameters from today, and compared the results to the patient's prior recorded data.  No programming changes were made.  The LVAD is functioning within specified parameters.  The patient performs LVAD self-test daily.  LVAD interrogation was negative for any significant power changes, alarms or PI events/speed drops.  LVAD equipment check completed and is in good working order.  Back-up equipment present.   LVAD education done on emergency procedures and precautions and reviewed exit site care.  Length of Stay: 82  CLEGG,AMY  NP-C  10/07/2014, 8:07 AM  VAD Team --- VAD ISSUES ONLY--- Pager [phone] (7am - 7am)  Advanced Heart Failure  Team  Pager [phone] (M-F; [place])  Please contact [facility] for night-coverage after hours (4p -7a ) and weekends on [email or web address]  Patient seen with NP, agree with the above note.  He got 2 units PRBCs last night, to send CBC again this afternoon  PI improved.  MAP 80s-90s.  CVP 10 with CI 3.3.  Afebrile with blood cultures NGTD.  Will continue current abx for now.  No anticoagulation at this point.  LVAD parameters stable.  Echo done today, appearance stable.    Loralie Champagne 10/07/2014 3:26 PM

## 2014-10-07 NOTE — Progress Notes (Signed)
The patient's hemoglobin this morning was 7.2.  Dr. [name] was informed.  Dr. [name] gave the order for 2 Units of blood to be given to the patient and to start him on Protonix 40 mg by mouth daily starting this morning.  Will continue to monitor the patient.  Lupita Dawn

## 2014-10-07 NOTE — Progress Notes (Addendum)
Dr. Cyndia Bent paged with results of ABG.  Per MD, anesthesia paged to re-intubate patient.  Patients wife notified.

## 2014-10-07 NOTE — Anesthesia Procedure Notes (Signed)
Procedure Name: Intubation Date/Time: 10/07/2014 2:51 AM Performed by: Valetta Fuller Pre-anesthesia Checklist: Patient identified, Emergency Drugs available, Suction available, Patient being monitored and Timeout performed Patient Re-evaluated:Patient Re-evaluated prior to inductionOxygen Delivery Method: Ambu bag Preoxygenation: Pre-oxygenation with 100% oxygen Intubation Type: IV induction Laryngoscope Size: Glidescope Grade View: Grade I Tube type: Subglottic suction tube Tube size: 7.5 mm Number of attempts: 1 Airway Equipment and Method: Patient positioned with wedge pillow,  Video-laryngoscopy and Stylet Placement Confirmation: ETT inserted through vocal cords under direct vision,  positive ETCO2 and breath sounds checked- equal and bilateral Secured at: 23 cm Tube secured with: Tape Dental Injury: Teeth and Oropharynx as per pre-operative assessment

## 2014-10-07 NOTE — Progress Notes (Signed)
Echocardiogram 2D Echocardiogram limited has been performed.  Finneus Kaneshiro 10/07/2014, 10:17 AM

## 2014-10-07 NOTE — Progress Notes (Signed)
PT Cancellation Note  Patient Details Name: Scott [name] MRN: [id number] DOB: Aug 03, 1945   Cancelled Treatment:    Reason Eval/Treat Not Completed: Patient not medically ready.  Still needs to be extubated among other issues.  Will try as able 4/8. 10/07/2014  Scott [name], [name] [phone]  (pager)    Scott [name], [name] 10/07/2014, 10:24 AM

## 2014-10-07 NOTE — Progress Notes (Signed)
RT called to patients room for self-extubation. Dr. Cyndia Bent contacted and RT was instructed to place pt on BiPAP with same nitric settings prior to self-extubation. Pt is resting comfortably on BiPAP at this time with all vitals within normal limits. RT will continue to monitor.

## 2014-10-07 NOTE — Progress Notes (Signed)
HeartMate 2 Rounding Note  Subjective:   POD #16   VAD implant  (DT)  For acute /chronic systolic HF from ischemic cardiomyopathy and cardiogenic shock on preop IABP  POD#2 re-exploration for bleeding - coagulopathy/ oozing in pump pocket- all pump anastomoses dry  Prior hx PE- with large R pulmonary infarct,necrotic    lung/pneumonia- poss empyema on IV Zosyn- brown sputum Restrictive lung disease- FEV1 1.3 from ankylosing spondylitis and CO2 retention DM  HX ventricular, atrial arrhythmia Postoperative CO2 retention requiring BiPAP    No sig chest tube output 24 hr  Stable hemodynamics on low dose inotropes 2D echo today with good RV fx, LV decompressed by VAD Not ready for extubation- cont inhaled NO Diuresing today Panda placed and TF nutrition started  holding HeartMate II LVAD:  Flow 5.5 liters/min, speed 9200 rpm power 5.1, PI  5.0  Controller intact  Objective:    Vital Signs:   Temp:  [97.9 F (36.6 C)-100.8 F (38.2 C)] 98.2 F (36.8 C) (04/07 1715) Pulse Rate:  [89-152] 152 (04/07 1700) Resp:  [10-39] 24 (04/07 1715) BP: (97-103)/(74-84) 97/74 mmHg (04/07 1629) SpO2:  [95 %-100 %] 98 % (04/07 1700) Arterial Line BP: (53-120)/(48-88) 75/68 mmHg (04/07 1715) FiO2 (%):  [40 %-50 %] 40 % (04/07 1629) Weight:  [205 lb 4 oz (93.1 kg)] 205 lb 4 oz (93.1 kg) (04/07 0500) Last BM Date: 10/04/14 Mean arterial Pressure 78  Intake/Output:   Intake/Output Summary (Last 24 hours) at 10/07/14 1734 Last data filed at 10/07/14 1700  Gross per 24 hour  Intake 4617.2 ml  Output   2885 ml  Net 1732.2 ml     Physical Exam: General:sedated on vent HEENT: normal Neck: supple. JVP . Carotids 2+ bilat; no bruits. No lymphadenopathy or thryomegaly appreciated. Cor: Mechanical heart sounds with LVAD hum present. Lungs: bibasilar rales Abdomen: soft, nontender, nondistended. No hepatosplenomegaly. No bruits or masses. Good bowel sounds. Driveline: C/D/I; securement device  intact and driveline incorporated Extremities: warm well perfused,no cyanosis, clubbing, rash, edema- Neuro: responsive when sedation wears off Sternal incision dry   Telemetry:nsr  Labs: Basic Metabolic Panel:  Recent Labs Lab 10/05/14 0530  10/05/14 2000  10/06/14 0315  10/06/14 0926 10/06/14 0932 10/06/14 1500 10/06/14 1553 10/07/14 0409 10/07/14 1537  NA 133*  < >  --   < > 139  < > 140 141 139 140 138 137  K 3.9  < >  --   < > 3.8  < > 4.0 4.0 4.0 4.1 4.1 3.4*  CL 92*  < >  --   < > 104  --  104 99 103 98 104 98  CO2 34*  --   --   --  29  --  29  --  29  --  23  --   GLUCOSE 162*  < >  --   < > 71  --  79 79 103* 100* 132* 101*  BUN 10  < >  --   < > 16  --  18 21 21 21 21 21   CREATININE 0.72  < > 0.70  < > 0.88  --  1.03 0.90 0.94 0.90 0.89 0.80  CALCIUM 8.2*  --   --   --  9.1  --  8.7  --  8.2*  --  8.0*  --   MG  --   --  1.6  --  1.5  --   --   --  1.2*  --  1.3*  --   < > = values in this interval not displayed.  Liver Function Tests:  Recent Labs Lab 10/01/14 0401 10/03/14 0509 10/06/14 0315 10/06/14 0926 10/07/14 0409  AST 51* 50* 26 31 33  ALT 59* 53 17 19 17   ALKPHOS 266* 216* 71 62 64  BILITOT 1.0 0.9 2.7* 3.9* 2.3*  PROT 6.9 5.9* 4.8* 4.5* 4.1*  ALBUMIN 2.4* 2.1* 3.1* 2.8* 2.2*   No results for input(s): LIPASE, AMYLASE in the last 168 hours. No results for input(s): AMMONIA in the last 168 hours.  CBC:  Recent Labs Lab 10/06/14 0926  10/06/14 1500 10/06/14 1553 10/07/14 0200 10/07/14 0409 10/07/14 1537 10/07/14 1540  WBC 25.9*  --  23.5*  --  26.1* 26.0*  --  23.0*  NEUTROABS  --   --   --   --   --  22.6*  --   --   HGB 9.9*  < > 8.4* 7.8* 8.9* 8.5* 8.8* 8.9*  HCT 28.4*  < > 23.7* 23.0* 25.6* 24.0* 26.0* 25.2*  MCV 85.3  --  83.7  --  84.5 85.1  --  85.1  PLT 159  --  161  --  214 198  --  193  < > = values in this interval not displayed.  INR:  Recent Labs Lab 10/05/14 1700 10/05/14 2000 10/06/14 0719 10/06/14 0930  10/07/14 0409  INR 2.02* 1.04 1.10 1.26 1.47    Other results:  EKG:   Imaging: Dg Chest Port 1 View  10/07/2014   CLINICAL DATA:  Post intubation.  Left ventricular assist device.  EXAM: PORTABLE CHEST - 1 VIEW  COMPARISON:  10/06/2014  FINDINGS: Postoperative changes in the mediastinum. Left ventricular assist device present. Endotracheal tube tip measures 4.8 cm above the carinal. Enteric tube tip is in the left upper quadrant consistent with location in the upper stomach. Bilateral chest tubes and mediastinal drains are present. Swan-Ganz catheter with tip over the pulmonary outflow tract. Right PICC line with tip over the low SVC region. No pneumothorax. Consolidation in the right lung base is slightly increased since previous study. Blunting of the costophrenic angles suggests small effusions.  IMPRESSION: Appliances are unchanged in satisfactory position. Mild increase of consolidation in the right lung base. Probable small pleural effusions.   Electronically Signed   By: Lucienne Capers M.D.   On: 10/07/2014 03:25   Dg Chest Portable 1 View  10/06/2014   CLINICAL DATA:  Pneumothorax post open heart surgery  EXAM: PORTABLE CHEST - 1 VIEW  COMPARISON:  Portable exam 0843 hours compared to 0300 hours  FINDINGS: Tip of endotracheal tube projects 4.3 cm above carina.  Nasogastric tube extends into stomach.  RIGHT jugular Swan-Ganz catheter tip projects over main pulmonary artery.  BILATERAL thoracostomy tubes and mediastinal drain.  RIGHT arm PICC line tip projects over cavoatrial junction.  LEFT ventricular assist device noted.  Enlargement of cardiac silhouette with pulmonary vascular congestion.  Bibasilar atelectasis more focal on RIGHT unchanged.  Decreased LEFT pleural effusion.  No pneumothorax.  Lateral LEFT chest wall subcutaneous emphysema.  IMPRESSION: Postsurgical changes of median sternotomy and LVAD.  Decreased LEFT pleural effusion.  Bibasilar atelectasis greater on RIGHT.  No  pneumothorax.   Electronically Signed   By: Lavonia Dana M.D.   On: 10/06/2014 08:57   Dg Chest Port 1 View  10/06/2014   CLINICAL DATA:  Left-sided chest tube placed.  EXAM: PORTABLE CHEST - 1 VIEW  COMPARISON:  10/05/2014  FINDINGS: Postoperative changes in the mediastinum. Endotracheal tube, enteric tube, Swan-Ganz catheter, right chest tube, right PICC line, and left chest tube unchanged in position. Interval placement of a second left chest tube. Small left pleural effusion is demonstrated. Infiltration or consolidation throughout the left lung and focally in the right lung again demonstrated. No visible pneumothorax.  IMPRESSION: Interval placement of left chest tube. Other appliances unchanged in position. Persistent consolidation in the left lung and focally in the right lung with left pleural fluid demonstrated.   Electronically Signed   By: Lucienne Capers M.D.   On: 10/06/2014 03:19   Dg Chest Port 1 View  10/05/2014   CLINICAL DATA:  Bleeding in chest.  EXAM: PORTABLE CHEST - 1 VIEW  COMPARISON:  10/05/2014  FINDINGS: Endotracheal tube with tip measuring 4.2 cm above the carina. Swan-Ganz catheter with tip over the pulmonary outflow tract. Right PICC catheter with tip over the cavoatrial junction. Bilateral chest tubes. Enteric tube tip is off the field of view but below the left hemidiaphragm. Mediastinal drains.  Shallow inspiration. Diffuse cardiac enlargement. Increasing opacity in the left lung could represent consolidation or hematoma, given the history of bleeding. Probable small left pleural effusion. Focal consolidation in the right lung base is unchanged.  IMPRESSION: Increasing opacity in the left lung and developing left pleural effusion. Appliances appear in satisfactory position. Right lung base consolidation unchanged.   Electronically Signed   By: Lucienne Capers M.D.   On: 10/05/2014 23:59   Dg Abd Portable 1v  10/07/2014   CLINICAL DATA:  Feeding tube placement  EXAM: PORTABLE  ABDOMEN - 1 VIEW  COMPARISON:  September 27, 2014  FINDINGS: Feeding tube tip is in the stomach. Nasogastric tube tip and side port are also in the stomach. The bowel gas pattern is unremarkable. No obstruction or free air is seen on this supine examination. There is a left ventricular assist device present. There is a filter in the inferior vena cava with the apex directed superiorly at the level of L2.  IMPRESSION: Nasogastric tube tip and side port as well as feeding tube tip are in the stomach region. Bowel gas pattern is unremarkable.   Electronically Signed   By: Lowella Grip III M.D.   On: 10/07/2014 13:16     Medications:     Scheduled Medications: . acetaminophen  1,000 mg Oral 4 times per day   Or  . acetaminophen (TYLENOL) oral liquid 160 mg/5 mL  1,000 mg Per Tube 4 times per day  . acetaminophen (TYLENOL) oral liquid 160 mg/5 mL  650 mg Per Tube Once   Or  . acetaminophen  650 mg Rectal Once  . acetaminophen (TYLENOL) oral liquid 160 mg/5 mL  650 mg Per Tube Once   Or  . acetaminophen  650 mg Rectal Once  . amiodarone  150 mg Intravenous Once  . antiseptic oral rinse  7 mL Mouth Rinse QID  . budesonide-formoterol  2 puff Inhalation BID  . chlorhexidine  15 mL Mouth Rinse BID  . furosemide  20 mg Intravenous BID  . insulin aspart  0-24 Units Subcutaneous 6 times per day  . insulin glargine  10 Units Subcutaneous BID  . metoCLOPramide (REGLAN) injection  10 mg Intravenous 4 times per day  . pantoprazole (PROTONIX) IV  40 mg Intravenous QHS  . piperacillin-tazobactam (ZOSYN)  IV  3.375 g Intravenous 3 times per day  . sodium chloride  10 mL Intravenous Q12H  . traZODone  50  mg Oral QHS  . vancomycin  1,000 mg Intravenous Q12H    Infusions: . sodium chloride 10 mL/hr at 10/07/14 1700  . amiodarone 150 mg/hr (10/07/14 1724)   Followed by  . amiodarone    . dexmedetomidine 0.699 mcg/kg/hr (10/07/14 1700)  . epinephrine 5.013 mcg/min (10/07/14 1700)  . feeding supplement  (VITAL 1.5 CAL) 1,000 mL (10/07/14 1700)  . fentaNYL infusion INTRAVENOUS 50 mcg/hr (10/07/14 1700)  . lactated ringers 20 mL/hr at 10/06/14 0543  . milrinone 0.25 mcg/kg/min (10/07/14 1700)  . norepinephrine (LEVOPHED) Adult infusion 15.04 mcg/min (10/07/14 1700)    PRN Medications: sodium chloride, fentaNYL, levalbuterol, midazolam, ondansetron (ZOFRAN) IV, oxyCODONE, potassium chloride, sodium chloride, traMADol   Assessment:  Stable hemodynamics- , VAD speed 9200 Resting patient today after re-exploration for bleeding  Cont IV Zosyn for possible pneumonia, empyema from past pulmonary infarct Cont vancomycin postop 48 hrs  Plan/Discussion:    Remove drains as indicated Vent wean after 48 hrs diuresis TF nutrition Hold anticoagulation Patients condition updated and plan of care discussed daily with wife    I reviewed the LVAD parameters from today, and compared the results to the patient's prior recorded data.  No programming changes were made.  The LVAD is functioning within specified parameters.  The patient performs LVAD self-test daily.  LVAD interrogation was negative for any significant power changes, alarms or PI events/speed drops.  LVAD equipment check completed and is in good working order.  Back-up equipment present.   LVAD education done on emergency procedures and precautions and reviewed exit site care.  Length of Stay: [place] III 10/07/2014, 5:34 PM

## 2014-10-07 NOTE — Op Note (Signed)
NAME:  Scott [name], Scott [name] NO.:  [id number]  MEDICAL RECORD NO.:  [id number]  LOCATION:  2S13C                        FACILITY:  [facility]  PHYSICIAN:  Ivin Poot, M.D.  DATE OF BIRTH:  [birth year]  DATE OF PROCEDURE:  10/06/2014 DATE OF DISCHARGE:                              OPERATIVE REPORT   OPERATION:  Left anterolateral thoracotomy, evacuation of left chest hematoma, and reexploration for bleeding.  SURGEON:  Ivin Poot, M.D.  ASSISTANT:  Lars Pinks, PA-C.  PREOPERATIVE DIAGNOSES:  Status post implantation of permanent LVAD HeartMate II for advanced ischemic cardiomyopathy and heart failure with postoperative bleeding from coagulopathy 2 weeks following implantation.  POSTOPERATIVE DIAGNOSES:  Status post implantation of permanent LVAD HeartMate II for advanced ischemic cardiomyopathy and heart failure with postoperative bleeding from coagulopathy 2 weeks following implantation.  ANESTHESIA:  General.  INDICATIONS:  The patient recently underwent redo sternotomy and exploration for bleeding after having a left ventricular assist device implanted for advanced heart failure from ischemic cardiomyopathy.  The patient done well until 24 hours previously when he developed dizziness, low blood pressure, and low flows and low filling of his heart pump. Chest x-ray showed a new left hemothorax.  An echocardiogram showed extrinsic compression in the left ventricular chamber.  All these changes occurred over 24-hour period.  The patient had the mediastinal and left pleural hematoma evacuated and he was carefully monitored over the night.  Initially he was doing well but then developed signs of reaccumulation of blood-clot in his left chest and mediastinum indicated by a fall in hemoglobin, fall in mean arterial pressure, and decreased flow rates and filling of the heart pump.  After failing to correct the problems with medications, blood products, and  bedside procedures, I recommended the patient's wife to return the patient for reexploration, this time to a left anterolateral thoracotomy to get a better exposure of the pump pocket in left pleural space.  She understood and agreed to proceed with surgery over a phone consent in the ICU.  OPERATIVE FINDINGS: 1. Reaccumulation of clotted and non-clotted blood left pleural space     in the pump pocket. 2. No active bleeding around the inflow cannula at the LV apex which     was well exposed and inspected. 3. Slow but active ventricle blood from pump pocket in the area of the     anterior rectus sheath which was controlled with electrocautery and     topical hemostatic agents. 4. Improvement in heart pump flow and filling and improvement in blood     pressure after removal of the hematoma from the left pleural space     and the heart pump pocket.  OPERATIVE PROCEDURE:  The patient was brought directly from the ICU to the operating room and placed supine on the operating table.  The patient was intubated but a single-lumen tube was exchanged for double- lumen endotracheal tube by anesthesia.  A transesophageal echo probe was placed by anesthesia.  This showed mild compression of the lateral wall left ventricle by the effusion and thoracic hematoma.  The patient was placed left side up with a shoulder roll.  The hand was positioned by his side.  The  patient was prepped and draped as a sterile field.  A proper time-out was performed.  A small incision was made just lateral to the nipple in the fifth interspace extending to the left midaxillary line.  An incision was carried down the pleural space.  The pleural space was opened. Unfortunately isolation of the left lung was unsuccessful with a double- lumen tube and ventilation had to be held for intervals while the surgical procedure was performed.  There is large amount of clotted and non-clotted blood in the left pleural space.   There was also a fair amount of mainly clotted blood in the heart pump pocket.  This was all carefully removed and irrigated. The ribs were gently spread with a rib retractor.  The inflow cannula site at the LV apex was carefully examined.  There was no bleeding.  This was entirely hemostatic.  After removal of the formed clot from the pump pocket, some trickling of blood and active bleeding in the posterior rectus sheath was noted and this was controlled with cautery and some topical agents of hemostatic material.  The left pleural space and were the diaphragm was taken down off the left chest wall were careful examined.  There was no active bleeding. The pump pocket in the area around the apical cannulae was packed with a dry gauze and observed for several minutes.  There was no evidence of significant recurrence.  Two new additional chest tubes were placed in the left pleural space inferiorly and posteriorly and brought out through separate incisions and secured to the skin with skin sutures.  The ribs were reapproximated with 2 figure-of-eight #2 pericostal Vicryl sutures.  The lungs were re-expanded.  The Vicryl pericostal sutures were then tied.  The chest wall was closed in layers using interrupted #1 Vicryl for the muscle layer.  The subcutaneous layer was closed with a running 2-0 Vicryl and the skin was closed with a subcuticular Vicryl.  The chest tubes were connected to underwater seal suction drainage.  The patient was then turned supine and the double-lumen tube was exchanged for a single-lumen endotracheal tube.  The 2D echo showed improved biventricular function after the procedure.  Hemodynamics showed improvement as well.  The patient then returned directly to the ICU.     Ivin Poot, M.D.     PV/MEDQ  D:  10/06/2014  T:  10/07/2014  Job:  [id number]

## 2014-10-07 NOTE — Progress Notes (Signed)
Admitted 09/10/14 with worsening heart failure and cardiogenic shock.  HeartMate II LVAD implated on 09/21/14 by Dr. Darcey Nora as DT VAD.   Vital signs: Temp:  97.2 - 100.8 F HR: 90 - 135, SR - ST Doppler MAP:  50 - 80 O2 Sat:  95 - 100  Wt in lbs:  173>194>194.7>192.7>187>184>184>182>188>187>181>189>190>205  LVAD interrogation reveals:  Speed:  9200 Flow: 5.0 Power:  5.5 PI:  6.5 Alarms: none Events: none Fixed speed:  9200 Low speed limit: 8600  Blood products: OR:   09/21/14:  1 unit PC, 2 plts, 3 FFP 10/05/14:  3 PC's, 500 cell saver 10/06/14:  2 PCs, 2 cryo, 300 cell saver     ICU:  09/22/14:   1 unit PC 10/05/14:  2 PCs, 2 FFP 10/06/14:  5 PCs, 2 FFP, 1 Plt, 1 cryo, 3 Factro VII, DDAVP  Nitric Oxide:  3 PPM  Current gtts: Milinone:  0.25 mcg/kg/min Epi: 5 Levo: 15  ASA 325 mg:  LD 10/04/14  Warfarin: LD 10/03/14   Labs:  LDH trend: 364>496>380>341>375>398>379 350>352>351>311>322>305>371>275  INR trend: 1.42 > 1.44 > 1.6 > 1.84 > 2.02 > 1.81 > 2.47 > 2.64 >2.4>1.78>2.0>1.0>1.2>1.47  Co-Ox:  62.3% >64>65  WBC:  111.4 > 15.4 (Urine Culture sent) > 17.2 > 15.6 > 14.5>15.8>26.8>18.9>27.5  Hgb:  9.0 > 8.8 > 8.0 > 7.9 > 8.2 > 9.3> 8.7 > 8.6 > 7.6 > 8.5>7.3>9.9>8.4>8.5  Drive Line:  VAD dressing removed and site care performed using sterile technique. One suture remains intact, skin closed,  edges approximated.  Exit site with decreasing erythema, small amount serous drainage, no foul odor. The velour is fully implanted at exit site.   Dressing changed using Chlora prep applicators x 2, allowed site to dry, and gauze dressing with aquacel strip applied. Drive line anchor in place. Driveline dressing is being changed daily per sterile technique.      Plan/Recommendations and Discharge Needs as discussed with VAD team:  1.  Bleeding slowed overnight; received 2 units PCs last night, hemodynamically stable.   2. Pt extubated self early this am; PCO2 60's, reintubated;  plan to leave intubated next 24 hrs per Dr. Darcey Nora  4. WBC 27.5 - urine culture negative (final) & sputum culture NGTD.  Pt on Vanc and Zosyn.   5.  Wt up - Lasix 20 mg IV this am.  6.  Reglan started for ileus prevention per Dr. Darcey Nora.

## 2014-10-07 NOTE — Progress Notes (Signed)
Patient was seen pulling out his ET tube.  RT to bedside, MD paged. Orders received to place patient on Bi-pap and draw an ABG in an hour.  The patient previously has been neuro intact with appropriate judgement and safety awareness.

## 2014-10-07 NOTE — Progress Notes (Signed)
OT Cancellation Note  Patient Details Name: JOFFREY KERCE MRN: [id number] DOB: 1946/04/20   Cancelled Treatment:    Reason Eval/Treat Not Completed: Medical issues which prohibited therapy. Pt intubated, swan-ganz in place.  Will continue to follow.  [name] 10/07/2014, 2:22 PM

## 2014-10-07 NOTE — Progress Notes (Addendum)
NUTRITION FOLLOW UP  Intervention:   Recommend initiate Vital 1.5 @ 20 ml/hr via OGT and increase by 10 ml every 4 hours to goal rate of 50 ml/hr.   30 ml Prostat TID  Tube feeding regimen provides 2100 kcal (100% of needs), 126 grams of protein, and 917 ml of H2O.   Nutrition Dx:   Increased nutrient needs related to LVAD as evidenced by estimated energy needs; ongoing  Inadequate oral intake related to inability to eat as evidenced by NPO  Goal:   Pt to meet >/= 90% of estimated energy needs; not met  Monitor:   Respiratory status, nutrition support initiation, labs, weight changes, I/O's  Assessment:   69 y/o with history of CAD with stenting of LCX 2013, DM2, carotid stenting and ankylosing spondylitis. Presented with an anterolateral MI in 12/15 and was discharged with a life vest and medical therapy. Pt was admitted with worsening heart failure symptoms and has undergone right heart catheterization and is now going to be started on Milrinone and then LVAD.   3/22- Echocardiogram transesophageal; insertion of implantable left ventricle assist device 3/23- Extubated on BiPAP with no complications; pleural CT removed 3/24- Remains on BiPAP for CO2 retention secondary to probably restrictive lung disease from ankylosing spondylitis; attempting to wean off BiPAP today 3/25- Consulted for TPN/TNA 3/27- TPN d/c due to resolved ileus  Prior to intubation, pt was consuming 50-100% of meals on a regular diet. Ensure supplements were ordered, however, pt refused them due to preferring Magic Cups.  Rapid response called on 09/30/14 due to abnormal ABG and was subsequently placed on Bi-Pap.  Chest tube was placed on 10/05/14 due to hemothorax (replaced on 10/06/14). S/p Procedure on 10/05/14, which revealed clotted lt hemothorax:  TRANSESOPHAGEAL ECHOCARDIOGRAM (TEE), REDO STERNOTOMY,  EXPLORATION FOR BLEEDING-POST Op LVAD, DRAIN LEFT HEMOTHORAX  S/p PROCEDURE on 10/06/14:  LEFT THORACOTOMY  for EVACUATION OF LEFT CHEST HEMATOMA, CAUTERIZATION of BLEEDING SITES  Patient is currently intubated on ventilator support. Noted that pt self-extubated on 10/06/14, but was re-intubated earlier this AM. OGT in place.  MV: 10.1 L/min Temp (24hrs), Avg:99.4 F (37.4 C), Min:97.2 F (36.2 C), Max:100.8 F (38.2 C)  Propofol: n/a  Noted wt increase due to edema. Pt is currently receiving IV lasix.   Pt with total of 7 chest tubes. Per chart review, output has been decreasing Noted approximately 510 ml output from all chest tubes in the past 12 hours.   Noted orders for Vital 1.5 @ 30 ml/hr (which provides 1080 kcals and 49 grams protein, which meets 51% of estimated energy needs39% of estimated nutritional needs). TF has not been started yet- RN has placed call to pharmacy to order TF formula.  Labs reviewed. Mg: 1.3, Glucose: 132, Calcium: 8.0. CBGS: 82-126.   Height: Ht Readings from Last 1 Encounters:  09/30/14 5' 8"  (1.727 m)    Weight Status:   Wt Readings from Last 1 Encounters:  10/07/14 205 lb 4 oz (93.1 kg)   09/29/14 184 lb 8.4 oz (83.7 kg)   09/24/14 192 lb 7.4 oz (87.3 kg)           Re-estimated needs:  Kcal: 2102.6 Protein: 125-135 grams Fluid: >2.1 L  Skin: closed lt chest incision, closed rt abdominal incision, +2 generalized edema, +2 RUE edema, non-pitting RLE and LLE edema  Diet Order: Diet NPO time specified   Intake/Output Summary (Last 24 hours) at 10/07/14 0930 Last data filed at 10/07/14 0905  Gross per 24 hour  Intake 5387.28 ml  Output   3530 ml  Net 1857.28 ml    Last BM: 10/04/14   Labs:   Recent Labs Lab 10/06/14 0315  10/06/14 0926  10/06/14 1500 10/06/14 1553 10/07/14 0409  NA 139  < > 140  < > 139 140 138  K 3.8  < > 4.0  < > 4.0 4.1 4.1  CL 104  --  104  < > 103 98 104  CO2 29  --  29  --  29  --  23  BUN 16  --  18  < > 21 21 21   CREATININE 0.88  --  1.03  < > 0.94 0.90 0.89  CALCIUM 9.1  --  8.7  --  8.2*  --   8.0*  MG 1.5  --   --   --  1.2*  --  1.3*  GLUCOSE 71  --  79  < > 103* 100* 132*  < > = values in this interval not displayed.  CBG (last 3)   Recent Labs  10/06/14 2201 10/06/14 2305 10/06/14 2356  GLUCAP 126* 82 123*    Scheduled Meds: . acetaminophen  1,000 mg Oral 4 times per day   Or  . acetaminophen (TYLENOL) oral liquid 160 mg/5 mL  1,000 mg Per Tube 4 times per day  . acetaminophen (TYLENOL) oral liquid 160 mg/5 mL  650 mg Per Tube Once   Or  . acetaminophen  650 mg Rectal Once  . amiodarone  200 mg Oral BID  . antiseptic oral rinse  7 mL Mouth Rinse QID  . budesonide-formoterol  2 puff Inhalation BID  . chlorhexidine  15 mL Mouth Rinse BID  . furosemide  20 mg Intravenous BID  . insulin glargine  10 Units Subcutaneous BID  . insulin regular  0-10 Units Intravenous TID WC  . magnesium sulfate 1 - 4 g bolus IVPB  4 g Intravenous Once  . metoCLOPramide (REGLAN) injection  10 mg Intravenous 4 times per day  . pantoprazole (PROTONIX) IV  40 mg Intravenous QHS  . piperacillin-tazobactam (ZOSYN)  IV  3.375 g Intravenous 3 times per day  . [COMPLETED] potassium chloride  10 mEq Intravenous Q1 Hr x 2  . [MAR Hold] rifampin  600 mg Oral Once  . sodium chloride  10 mL Intravenous Q12H  . traZODone  50 mg Oral QHS  . vancomycin  1,000 mg Intravenous Q12H    Continuous Infusions: . sodium chloride 10 mL/hr at 10/07/14 0901  . dexmedetomidine 0.699 mcg/kg/hr (10/07/14 0901)  . epinephrine 5.013 mcg/min (10/07/14 0901)  . feeding supplement (VITAL 1.5 CAL)    . insulin (NOVOLIN-R) infusion 2 Units/hr (10/07/14 0905)  . lactated ringers 20 mL/hr at 10/06/14 0543  . milrinone 0.25 mcg/kg/min (10/07/14 0901)  . norepinephrine (LEVOPHED) Adult infusion 15.04 mcg/min (10/07/14 0901)    Kinnie Kaupp A. Jimmye Norman, RD, LDN, CDE Pager: [phone] After hours Pager: [phone]

## 2014-10-08 ENCOUNTER — Inpatient Hospital Stay (HOSPITAL_COMMUNITY): Payer: Medicare Other

## 2014-10-08 LAB — TYPE AND SCREEN
ABO/RH(D): A POS
ABO/RH(D): A POS
Antibody Screen: NEGATIVE
Antibody Screen: NEGATIVE
Unit division: 0
Unit division: 0
Unit division: 0
Unit division: 0
Unit division: 0
Unit division: 0
Unit division: 0
Unit division: 0
Unit division: 0
Unit division: 0
Unit division: 0
Unit division: 0
Unit division: 0
Unit division: 0
Unit division: 0
Unit division: 0
Unit division: 0
Unit division: 0
Unit division: 0
Unit division: 0

## 2014-10-08 LAB — POCT I-STAT, CHEM 8
BUN: 18 mg/dL (ref 6–23)
BUN: 15 mg/dL (ref 6–23)
Calcium, Ion: 1.2 mmol/L (ref 1.13–1.30)
Calcium, Ion: 1.14 mmol/L (ref 1.13–1.30)
Chloride: 95 mmol/L — ABNORMAL LOW (ref 96–112)
Chloride: 95 mmol/L — ABNORMAL LOW (ref 96–112)
Creatinine, Ser: 0.7 mg/dL (ref 0.50–1.35)
Creatinine, Ser: 0.7 mg/dL (ref 0.50–1.35)
Glucose, Bld: 144 mg/dL — ABNORMAL HIGH (ref 70–99)
Glucose, Bld: 118 mg/dL — ABNORMAL HIGH (ref 70–99)
HCT: 26 % — ABNORMAL LOW (ref 39.0–52.0)
HCT: 29 % — ABNORMAL LOW (ref 39.0–52.0)
Hemoglobin: 8.8 g/dL — ABNORMAL LOW (ref 13.0–17.0)
Hemoglobin: 9.9 g/dL — ABNORMAL LOW (ref 13.0–17.0)
Potassium: 3.6 mmol/L (ref 3.5–5.1)
Potassium: 3.5 mmol/L (ref 3.5–5.1)
SODIUM: 137 mmol/L (ref 135–145)
Sodium: 136 mmol/L (ref 135–145)
TCO2: 26 mmol/L (ref 0–100)
TCO2: 27 mmol/L (ref 0–100)

## 2014-10-08 LAB — GLUCOSE, CAPILLARY
GLUCOSE-CAPILLARY: 176 mg/dL — AB (ref 70–99)
GLUCOSE-CAPILLARY: 138 mg/dL — AB (ref 70–99)
Glucose-Capillary: 129 mg/dL — ABNORMAL HIGH (ref 70–99)
Glucose-Capillary: 118 mg/dL — ABNORMAL HIGH (ref 70–99)
Glucose-Capillary: 121 mg/dL — ABNORMAL HIGH (ref 70–99)
Glucose-Capillary: 135 mg/dL — ABNORMAL HIGH (ref 70–99)
Glucose-Capillary: 146 mg/dL — ABNORMAL HIGH (ref 70–99)

## 2014-10-08 LAB — CBC
HCT: 26.2 % — ABNORMAL LOW (ref 39.0–52.0)
HCT: 26.4 % — ABNORMAL LOW (ref 39.0–52.0)
Hemoglobin: 8.9 g/dL — ABNORMAL LOW (ref 13.0–17.0)
Hemoglobin: 9 g/dL — ABNORMAL LOW (ref 13.0–17.0)
MCH: 29.7 pg (ref 26.0–34.0)
MCH: 30.5 pg (ref 26.0–34.0)
MCHC: 34 g/dL (ref 30.0–36.0)
MCHC: 34.1 g/dL (ref 30.0–36.0)
MCV: 87.3 fL (ref 78.0–100.0)
MCV: 89.5 fL (ref 78.0–100.0)
Platelets: 182 10*3/uL (ref 150–400)
Platelets: 170 10*3/uL (ref 150–400)
RBC: 3 MIL/uL — ABNORMAL LOW (ref 4.22–5.81)
RBC: 2.95 MIL/uL — ABNORMAL LOW (ref 4.22–5.81)
RDW: 16.2 % — AB (ref 11.5–15.5)
RDW: 16.6 % — ABNORMAL HIGH (ref 11.5–15.5)
WBC: 20.1 10*3/uL — ABNORMAL HIGH (ref 4.0–10.5)
WBC: 21.9 10*3/uL — ABNORMAL HIGH (ref 4.0–10.5)

## 2014-10-08 LAB — BLOOD GAS, ARTERIAL
Acid-Base Excess: 5.2 mmol/L — ABNORMAL HIGH (ref 0.0–2.0)
Bicarbonate: 29.6 mEq/L — ABNORMAL HIGH (ref 20.0–24.0)
Drawn by: 252031
FIO2: 0.4 %
MECHVT: 600 mL
Nitric Oxide: 20
O2 Saturation: 99.2 %
PEEP: 5 cmH2O
Patient temperature: 97.2
RATE: 12 resp/min
TCO2: 31 mmol/L (ref 0–100)
pCO2 arterial: 45 mmHg (ref 35.0–45.0)
pH, Arterial: 7.429 (ref 7.350–7.450)
pO2, Arterial: 125 mmHg — ABNORMAL HIGH (ref 80.0–100.0)

## 2014-10-08 LAB — COMPREHENSIVE METABOLIC PANEL
ALT: 18 U/L (ref 0–53)
AST: 26 U/L (ref 0–37)
Albumin: 1.9 g/dL — ABNORMAL LOW (ref 3.5–5.2)
Alkaline Phosphatase: 92 U/L (ref 39–117)
Anion gap: 4 — ABNORMAL LOW (ref 5–15)
BUN: 16 mg/dL (ref 6–23)
CO2: 31 mmol/L (ref 19–32)
Calcium: 7.7 mg/dL — ABNORMAL LOW (ref 8.4–10.5)
Chloride: 101 mmol/L (ref 96–112)
Creatinine, Ser: 0.62 mg/dL (ref 0.50–1.35)
GFR calc Af Amer: >90 mL/min (ref >90)
GFR calc non Af Amer: >90 mL/min (ref >90)
Glucose, Bld: 141 mg/dL — ABNORMAL HIGH (ref 70–99)
Potassium: 3.6 mmol/L (ref 3.5–5.1)
Sodium: 136 mmol/L (ref 135–145)
Total Bilirubin: 1.4 mg/dL — ABNORMAL HIGH (ref 0.3–1.2)
Total Protein: 4.3 g/dL — ABNORMAL LOW (ref 6.0–8.3)

## 2014-10-08 LAB — VANCOMYCIN, TROUGH: Vancomycin Tr: 16 ug/mL (ref 10.0–20.0)

## 2014-10-08 LAB — CARBOXYHEMOGLOBIN
Carboxyhemoglobin: 1.2 % (ref 0.5–1.5)
Methemoglobin: 1.2 % (ref 0.0–1.5)
O2 SAT: 70.3 %
Total hemoglobin: 9 g/dL — ABNORMAL LOW (ref 13.5–18.0)

## 2014-10-08 LAB — PROTIME-INR
INR: 1.33 (ref 0.00–1.49)
PROTHROMBIN TIME: 16.7 s — AB (ref 11.6–15.2)

## 2014-10-08 LAB — CULTURE, RESPIRATORY W GRAM STAIN
Gram Stain: NONE SEEN
Special Requests: NORMAL

## 2014-10-08 LAB — MAGNESIUM: Magnesium: 1.8 mg/dL (ref 1.5–2.5)

## 2014-10-08 LAB — LACTATE DEHYDROGENASE: LDH: 246 U/L (ref 94–250)

## 2014-10-08 MED ORDER — POTASSIUM CHLORIDE 10 MEQ/50ML IV SOLN
INTRAVENOUS | Status: AC
  Administered 2014-10-08: 10 meq
  Filled 2014-10-08: qty 150

## 2014-10-08 MED ORDER — POTASSIUM CHLORIDE 10 MEQ/50ML IV SOLN
INTRAVENOUS | Status: AC
Start: 2014-10-08 — End: 2014-10-08
  Administered 2014-10-08: 10 meq
  Filled 2014-10-08: qty 100

## 2014-10-08 MED ORDER — POTASSIUM CHLORIDE 10 MEQ/50ML IV SOLN
10.0000 meq | INTRAVENOUS | Status: AC
  Administered 2014-10-08: 10 meq via INTRAVENOUS

## 2014-10-08 MED ORDER — WARFARIN - PHYSICIAN DOSING INPATIENT
Freq: Every day | Status: DC
  Administered 2014-10-08 – 2014-10-16 (×6)

## 2014-10-08 MED ORDER — FUROSEMIDE 10 MG/ML IJ SOLN
4.0000 mg/h | INTRAMUSCULAR | Status: DC
  Administered 2014-10-08: 6 mg/h via INTRAVENOUS
  Administered 2014-10-09: 4 mg/h via INTRAVENOUS
  Filled 2014-10-08 (×3): qty 25

## 2014-10-08 MED ORDER — MAGNESIUM SULFATE 2 GM/50ML IV SOLN
2.0000 g | Freq: Once | INTRAVENOUS | Status: AC
  Administered 2014-10-08: 2 g via INTRAVENOUS
  Filled 2014-10-08: qty 50

## 2014-10-08 MED ORDER — POTASSIUM CHLORIDE 10 MEQ/50ML IV SOLN
10.0000 meq | INTRAVENOUS | Status: AC
  Administered 2014-10-08 (×3): 10 meq via INTRAVENOUS
  Filled 2014-10-08: qty 50

## 2014-10-08 MED ORDER — WARFARIN SODIUM 2.5 MG PO TABS
2.5000 mg | ORAL_TABLET | Freq: Every day | ORAL | Status: DC
  Administered 2014-10-08 – 2014-10-09 (×2): 2.5 mg via ORAL
  Filled 2014-10-08 (×3): qty 1

## 2014-10-08 MED ORDER — IOHEXOL 300 MG/ML  SOLN
50.0000 mL | Freq: Once | INTRAMUSCULAR | Status: AC | PRN
  Administered 2014-10-08: 30 mL

## 2014-10-08 MED ORDER — PANTOPRAZOLE SODIUM 40 MG PO PACK
40.0000 mg | PACK | Freq: Every day | ORAL | Status: DC
  Administered 2014-10-08 – 2014-10-11 (×4): 40 mg
  Filled 2014-10-08 (×5): qty 20

## 2014-10-08 MED ORDER — POTASSIUM CHLORIDE 10 MEQ/50ML IV SOLN
10.0000 meq | INTRAVENOUS | Status: AC
  Administered 2014-10-08 (×2): 10 meq via INTRAVENOUS

## 2014-10-08 MED FILL — Magnesium Sulfate Inj 50%: INTRAMUSCULAR | Qty: 10 | Status: AC

## 2014-10-08 MED FILL — Potassium Chloride Inj 2 mEq/ML: INTRAVENOUS | Qty: 40 | Status: AC

## 2014-10-08 MED FILL — Heparin Sodium (Porcine) Inj 1000 Unit/ML: INTRAMUSCULAR | Qty: 30 | Status: AC

## 2014-10-08 NOTE — Progress Notes (Signed)
[name] is very tearful today in discussion with his husband's progress to me. She is unsure if he has the will and desire to carry on and get back into the mindset of healing and recovering. She informed me that Mr. Stukey was "very angry at her yesterday" and asking her "why didn't she let him die", of which she is bearing this burden today of blame. She has a lot of questions regarding care at home and what she needs to know to look for to know if he is doing poorly. Discussed that we need to take each day at a time and focus on what our goals are day to day. Emotional support provided and will continue to update her with the VAD team.

## 2014-10-08 NOTE — Progress Notes (Signed)
I spoke briefly with Ms. Tempesta and she expresses regret that they decided on LVAD. However, she really regrets that he is re-intubated as he did not want this. We will have to help them if they decide to focus more on comfort and no more aggressive interventions. I will follow up with them on Monday. Please call [phone] for acute palliative needs over the weekend.  Vinie Sill, NP Palliative Medicine Team Pager # [phone] (M-F 8a-5p) Team Phone # [phone] (Nights/Weekends)

## 2014-10-08 NOTE — Progress Notes (Addendum)
Patient ID: Scott [name], male   DOB: Jul 18, 1945, 69 y.o.   MRN: [id number]   HeartMate 2 Rounding Note  Subjective:    S/P HMII LVAD on 09/20/2013.   Moved to ICU  4/5 due to low MAP and low hemoglobin. Taken Back to the OR 4/5 and 4/6  for recurrent bleed and evacuation of clots from left chest. Off all anticoagulants.    Currently on milrinone at 0.25 mcg, epi at 5 mcg, levo at 12, nitric at 10, lasix drip at 6 mg per hour, and amio 30 mg per hour. Stable over night. Chest tube drainage down. Diuresing > 150cc/hr.   Remains intubated. Awake. Asking why we didn't let him die.   PAP 31/10  CO/CI 7.7 / 3.8   LVAD INTERROGATION:  HeartMate II LVAD:  Flow 4.9  liters/min, speed 9200 power 5 , PI 6.3   Rare PI events  Objective:    Vital Signs:   Temp:  [97.3 F (36.3 C)-98.6 F (37 C)] 98.2 F (36.8 C) (04/08 0915) Pulse Rate:  [63-152] 77 (04/08 0915) Resp:  [12-25] 17 (04/08 0915) BP: (89-98)/(68-84) 89/68 mmHg (04/08 0750) SpO2:  [97 %-100 %] 99 % (04/08 0915) Arterial Line BP: (66-120)/(60-88) 98/77 mmHg (04/08 0915) FiO2 (%):  [40 %] 40 % (04/08 0800) Weight:  [204 lb 12.9 oz (92.9 kg)] 204 lb 12.9 oz (92.9 kg) (04/08 0500) Last BM Date: 10/04/14 Mean arterial Pressure  70-80s  Intake/Output:   Intake/Output Summary (Last 24 hours) at 10/08/14 1014 Last data filed at 10/08/14 0900  Gross per 24 hour  Intake 3563.54 ml  Output   2740 ml  Net 823.54 ml     Physical Exam: CVP 12 General: Intubated. Pale  HEENT: normal Neck: supple. Carotids 2+ bilat; no bruits. No lymphadenopathy or thryomegaly appreciated. R neck Swan  Cor: Mechanical heart sounds with LVAD hum present. 7 chest tubes in place Lungs: clear Abdomen: soft, nontender, nondistended. No hepatosplenomegaly. No bruits or masses. Good bowel sounds. Driveline: Dressing looks good.  Extremities: no cyanosis, clubbing, rash.  3+ edema.  Neuro: Sedated on vent.  GU: foley   Telemetry: ST 110s     Labs: Basic Metabolic Panel:  Recent Labs Lab 10/05/14 2000  10/06/14 0315  10/06/14 0926  10/06/14 1500 10/06/14 1553 10/07/14 0409 10/07/14 1537 10/08/14 0400 10/08/14 0410  NA  --   < > 139  < > 140  < > 139 140 138 137 136 137  K  --   < > 3.8  < > 4.0  < > 4.0 4.1 4.1 3.4* 3.6 3.6  CL  --   < > 104  --  104  < > 103 98 104 98 101 95*  CO2  --   --  29  --  29  --  29  --  23  --  31  --   GLUCOSE  --   < > 71  --  79  < > 103* 100* 132* 101* 141* 144*  BUN  --   < > 16  --  18  < > 21 21 21 21 16 18   CREATININE 0.70  < > 0.88  --  1.03  < > 0.94 0.90 0.89 0.80 0.62 0.70  CALCIUM  --   --  9.1  --  8.7  --  8.2*  --  8.0*  --  7.7*  --   MG 1.6  --  1.5  --   --   --  1.2*  --  1.3*  --  1.8  --   < > = values in this interval not displayed.  Liver Function Tests:  Recent Labs Lab 10/03/14 0509 10/06/14 0315 10/06/14 0926 10/07/14 0409 10/08/14 0400  AST 50* 26 31 33 26  ALT 53 17 19 17 18   ALKPHOS 216* 71 62 64 92  BILITOT 0.9 2.7* 3.9* 2.3* 1.4*  PROT 5.9* 4.8* 4.5* 4.1* 4.3*  ALBUMIN 2.1* 3.1* 2.8* 2.2* 1.9*   No results for input(s): LIPASE, AMYLASE in the last 168 hours. No results for input(s): AMMONIA in the last 168 hours.  CBC:  Recent Labs Lab 10/06/14 1500  10/07/14 0200 10/07/14 0409 10/07/14 1537 10/07/14 1540 10/08/14 0400 10/08/14 0410  WBC 23.5*  --  26.1* 26.0*  --  23.0* 20.1*  --   NEUTROABS  --   --   --  22.6*  --   --   --   --   HGB 8.4*  < > 8.9* 8.5* 8.8* 8.9* 8.9* 8.8*  HCT 23.7*  < > 25.6* 24.0* 26.0* 25.2* 26.2* 26.0*  MCV 83.7  --  84.5 85.1  --  85.1 87.3  --   PLT 161  --  214 198  --  193 182  --   < > = values in this interval not displayed.  INR:  Recent Labs Lab 10/05/14 2000 10/06/14 0719 10/06/14 0930 10/07/14 0409 10/08/14 0400  INR 1.04 1.10 1.26 1.47 1.33    Other results:    Imaging: Dg Chest Port 1 View  10/08/2014   CLINICAL DATA:  Left ventricular assist device. Feeding tube  dysfunction.  EXAM: PORTABLE CHEST - 1 VIEW  COMPARISON:  10/07/2014.  10/06/2014.  FINDINGS: Endotracheal tube, NG tube, feeding tube, right PICC line, bilateral chest tubes, Swan-Ganz catheter in stable positions. Feeding tube may be slightly cold in the throat. Left ventricular assist device noted. Prior median sternotomy. Stable cardiomegaly. No pulmonary venous congestion. Persistent atelectasis and/or infiltrate right lung base. No prominent pleural effusion. No pneumothorax.  IMPRESSION: 1. Lines and tubes in stable position. Feeding tube may be slightly coiled in throat. 2. Prior median sternotomy. Left ventricular assist device again noted. Stable cardiomegaly. 3. Persistent atelectasis and/or infiltrate right lung base.   Electronically Signed   By: [place]   On: 10/08/2014 08:04   Dg Chest Port 1 View  10/07/2014   CLINICAL DATA:  Post intubation.  Left ventricular assist device.  EXAM: PORTABLE CHEST - 1 VIEW  COMPARISON:  10/06/2014  FINDINGS: Postoperative changes in the mediastinum. Left ventricular assist device present. Endotracheal tube tip measures 4.8 cm above the carinal. Enteric tube tip is in the left upper quadrant consistent with location in the upper stomach. Bilateral chest tubes and mediastinal drains are present. Swan-Ganz catheter with tip over the pulmonary outflow tract. Right PICC line with tip over the low SVC region. No pneumothorax. Consolidation in the right lung base is slightly increased since previous study. Blunting of the costophrenic angles suggests small effusions.  IMPRESSION: Appliances are unchanged in satisfactory position. Mild increase of consolidation in the right lung base. Probable small pleural effusions.   Electronically Signed   By: Lucienne Capers M.D.   On: 10/07/2014 03:25   Dg Abd Portable 1v  10/08/2014   CLINICAL DATA:  Dysfunctional feeding tube  EXAM: PORTABLE ABDOMEN - 1 VIEW  COMPARISON:  10/07/2014  FINDINGS: Scattered large and small  bowel gas is noted. A left ventricular  assist device is seen. A feeding catheter and nasogastric catheter are again noted and stable. The feeding catheter may be coiled upon itself within the throat proximally given findings on recent chest x-ray. Chest tubes are noted on the left. Postsurgical changes are seen consistent with the given clinical history.  IMPRESSION: Feeding catheter appears within normal limits on this abdominal film. Correlation with the recently obtained chest x-ray suggest some coiling within the throat. Clinical correlation is recommended. Additional imaging may be helpful.   Electronically Signed   By: Inez Catalina M.D.   On: 10/08/2014 08:00   Dg Abd Portable 1v  10/07/2014   CLINICAL DATA:  Feeding tube placement  EXAM: PORTABLE ABDOMEN - 1 VIEW  COMPARISON:  September 27, 2014  FINDINGS: Feeding tube tip is in the stomach. Nasogastric tube tip and side port are also in the stomach. The bowel gas pattern is unremarkable. No obstruction or free air is seen on this supine examination. There is a left ventricular assist device present. There is a filter in the inferior vena cava with the apex directed superiorly at the level of L2.  IMPRESSION: Nasogastric tube tip and side port as well as feeding tube tip are in the stomach region. Bowel gas pattern is unremarkable.   Electronically Signed   By: Lowella Grip III M.D.   On: 10/07/2014 13:16     Medications:     Scheduled Medications: . acetaminophen  1,000 mg Oral 4 times per day   Or  . acetaminophen (TYLENOL) oral liquid 160 mg/5 mL  1,000 mg Per Tube 4 times per day  . acetaminophen (TYLENOL) oral liquid 160 mg/5 mL  650 mg Per Tube Once   Or  . acetaminophen  650 mg Rectal Once  . amiodarone  150 mg Intravenous Once  . antiseptic oral rinse  7 mL Mouth Rinse QID  . budesonide-formoterol  2 puff Inhalation BID  . chlorhexidine  15 mL Mouth Rinse BID  . insulin aspart  0-24 Units Subcutaneous 6 times per day  . insulin  glargine  10 Units Subcutaneous BID  . magnesium sulfate 1 - 4 g bolus IVPB  2 g Intravenous Once  . metoCLOPramide (REGLAN) injection  10 mg Intravenous 4 times per day  . pantoprazole sodium  40 mg Per Tube QHS  . piperacillin-tazobactam (ZOSYN)  IV  3.375 g Intravenous 3 times per day  . potassium chloride  10 mEq Intravenous Q1 Hr x 2  . sodium chloride  10 mL Intravenous Q12H  . vancomycin  1,000 mg Intravenous Q12H  . warfarin  2.5 mg Oral q1800  . Warfarin - Physician Dosing Inpatient   Does not apply q1800    Infusions: . sodium chloride 10 mL/hr at 10/07/14 1900  . amiodarone 30 mg/hr (10/08/14 0933)  . dexmedetomidine 0.7 mcg/kg/hr (10/08/14 0933)  . epinephrine 5 mcg/min (10/08/14 0800)  . feeding supplement (VITAL 1.5 CAL) 1,000 mL (10/07/14 1900)  . fentaNYL infusion INTRAVENOUS 50 mcg/hr (10/08/14 0800)  . furosemide (LASIX) infusion 6 mg/hr (10/08/14 0845)  . lactated ringers 20 mL/hr at 10/06/14 0543  . lactated ringers 20 mL/hr at 10/07/14 1928  . milrinone 0.25 mcg/kg/min (10/07/14 1958)  . norepinephrine (LEVOPHED) Adult infusion 12 mcg/min (10/08/14 0900)    PRN Medications: sodium chloride, fentaNYL, iohexol, levalbuterol, midazolam, ondansetron (ZOFRAN) IV, oxyCODONE, sodium chloride, traMADol   Assessment:    1.  A/C systolic HF class IV with cardiogenic shock- EF 15% with moderate RV dysfunction 3/16  echo.         -S/P HMII LVAD 09/21/14  2. CAD s/p Anterior STEMI on 06/12/14 with stenting of LAD- was on coumadin + plavix, statin.  Now holding coumadin, off Plavix.  3. Acute on chronic hypercarbic respiratory failure  4. 06/12/14 VT in setting of STEMI 5. Ankylosing spondylitis. 6. Bilateral PE with R DVT- S/P IVC filter 08/27/2014--->coumadin 7. Acute Blood Loss into pleural space:  Back to OR 4/5 and 4/6 for recurrent bleed. Received multiple blood products.  Plan/Discussion:   S/P 09/21/2014 HMII LVAD.   Back to OR 4/5 and and again 4/6 this  morning for recurrent bleed and evacuation clots. Received multiple blood products. Hemoglobin 8.8 today.   PI improved now that he has had re-evacuation from pleural space.  Has 7 chest tubes with 3 coming out today.    Sinus Tach. Has history of VT. On amio drip. Mag 1.8. Give 2 grams mag today.  Off anticoagulants due to recurrent bleed.   Will not restart Plavix as anterior wall completely necrotic.    I reviewed the LVAD parameters from today, and compared the results to the patient's prior recorded data.  No programming changes were made.  The LVAD is functioning within specified parameters.  The patient performs LVAD self-test daily.  LVAD interrogation was negative for any significant power changes, alarms or PI events/speed drops.  LVAD equipment check completed and is in good working order.  Back-up equipment present.   LVAD education done on emergency procedures and precautions and reviewed exit site care.  Length of Stay: 80  CLEGG,AMY  NP-C  10/08/2014, 10:14 AM  VAD Team --- VAD ISSUES ONLY--- Pager [phone] (7am - 7am)  Advanced Heart Failure Team  Pager [phone] (M-F; 7a - 4p)  Please contact [facility] for night-coverage after hours (4p -7a ) and weekends on [email or web address]  Patient seen and examined with Darrick Grinder, NP. We discussed all aspects of the encounter. I agree with the assessment and plan as stated above.   Overall stabilizing but markedly volume overloaded. Bleeding seems to have slowed. CT surgery managing chest tubes. Will recheck CBC later today. Transfuse as needed.   Wean pressors as tolerated starting with EPI.   Continue IV lasix. VAD parameters stable.   The patient is critically ill with multiple organ systems failure and requires high complexity decision making for assessment and support, frequent evaluation and titration of therapies, application of advanced monitoring technologies and extensive interpretation of multiple databases.   Critical Care  Time devoted to patient care services described in this note is 35 Minutes.  Rony Bensimhon,MD 12:56 PM

## 2014-10-08 NOTE — Progress Notes (Signed)
ANTIBIOTIC CONSULT NOTE - FOLLOW UP ANTICOAGULATION NOTE - FOLLOW UP  Pharmacy Consult for Vancomycin and Coumadin Indication: leukocytosis/loculated effusion, hx PE/DVT and LVAD  No Known Allergies  Patient Measurements: Height: 5\' 8"  (172.7 cm) Weight: 204 lb 12.9 oz (92.9 kg) IBW/kg (Calculated) : 68.4  Vital Signs: Temp: 99.3 F (37.4 C) (04/08 2215) Temp Source: Core (Comment) (04/08 2200) BP: 81/67 mmHg (04/08 1955) Pulse Rate: 78 (04/08 2215) Intake/Output from previous day: 04/07 0701 - 04/08 0700 In: 3780.8 [I.V.:2160.8; NG/GT:720; IV Piggyback:900] Out: 3275 [Urine:2695; Emesis/NG output:50; Chest Tube:530] Intake/Output from this shift: Total I/O In: 296.4 [I.V.:116.4; NG/GT:130; IV Piggyback:50] Out: 2070 [Urine:2000; Chest Tube:70]  Labs:  Recent Labs  10/07/14 1540 10/08/14 0400 10/08/14 0410 10/08/14 1512 10/08/14 1605  WBC 23.0* 20.1*  --   --  21.9*  HGB 8.9* 8.9* 8.8* 9.9* 9.0*  PLT 193 182  --   --  170  CREATININE  --  0.62 0.70 0.70  --    Estimated Creatinine Clearance: 100.5 mL/min (by C-G formula based on Cr of 0.7).  Assessment: 68yom s/p LVAD 3/22, well known to pharmacy from previous antibiotic and anticoagulation dosing. Has been on vanc poss PNA. Vanc was resumed on 4/5. Cultures have been neg to date. Vanc trough came back therapeutic tonight.   Goal of Therapy:  Vancomycin trough level 15-20 mcg/ml  Plan:   Cont vanc 1g IV q12  Onnie Boer, PharmD Pager: [phone] 10/08/2014 10:48 PM

## 2014-10-08 NOTE — Progress Notes (Signed)
HeartMate 2 Rounding Note  Subjective:   POD #17   VAD implant  (DT)  For acute /chronic systolic HF from ischemic cardiomyopathy and cardiogenic shock on preop IABP  POD#3 re-exploration for bleeding - coagulopathy/ oozing in pump pocket- all pump anastomoses dry  Prior hx PE- with large R pulmonary infarct,necrotic    lung/pneumonia- poss empyema on IV Zosyn- brown sputum Restrictive lung disease- FEV1 1.3 from ankylosing spondylitis and CO2 retention DM  HX ventricular, atrial arrhythmia Postoperative CO2 retention requiring BiPAP    No sig chest tube output in last  24 hr  Stable hemodynamics on low dose inotropes 2D echo  with good RV fx, LV decompressed by VAD Not ready for extubation-  inhaled NO weaned off Diuresing today with lasix drip Panda placed and TF nutrition started Low dose coumadin started  HeartMate II LVAD:  Flow 5.5 liters/min, speed 9200 rpm power 5.1, PI  5.0  Controller intact  Objective:    Vital Signs:   Temp:  [97.3 F (36.3 C)-99.1 F (37.3 C)] 99.1 F (37.3 C) (04/08 1715) Pulse Rate:  [63-89] 80 (04/08 1715) Resp:  [12-25] 21 (04/08 1700) BP: (89-99)/(68-74) 99/71 mmHg (04/08 1625) SpO2:  [97 %-100 %] 98 % (04/08 1715) Arterial Line BP: (66-107)/(60-82) 98/71 mmHg (04/08 1715) FiO2 (%):  [40 %] 40 % (04/08 1625) Weight:  [204 lb 12.9 oz (92.9 kg)] 204 lb 12.9 oz (92.9 kg) (04/08 0500) Last BM Date: 10/04/14 Mean arterial Pressure 78  Intake/Output:   Intake/Output Summary (Last 24 hours) at 10/08/14 1748 Last data filed at 10/08/14 1700  Gross per 24 hour  Intake 4268.4 ml  Output   4415 ml  Net -146.6 ml     Physical Exam: General:sedated on vent HEENT: normal Neck: supple. JVP . Carotids 2+ bilat; no bruits. No lymphadenopathy or thryomegaly appreciated. Cor: Mechanical heart sounds with LVAD hum present. Lungs: bibasilar rales Abdomen: soft, nontender, nondistended. No hepatosplenomegaly. No bruits or masses. Good bowel  sounds. Driveline: C/D/I; securement device intact and driveline incorporated Extremities: warm well perfused,no cyanosis, clubbing, rash, edema- Neuro: responsive when sedation wears off Sternal incision dry   Telemetry:nsr  Labs: Basic Metabolic Panel:  Recent Labs Lab 10/05/14 2000  10/06/14 0315  10/06/14 0926  10/06/14 1500  10/07/14 0409 10/07/14 1537 10/08/14 0400 10/08/14 0410 10/08/14 1512  NA  --   < > 139  < > 140  < > 139  < > 138 137 136 137 136  K  --   < > 3.8  < > 4.0  < > 4.0  < > 4.1 3.4* 3.6 3.6 3.5  CL  --   < > 104  --  104  < > 103  < > 104 98 101 95* 95*  CO2  --   --  29  --  29  --  29  --  23  --  31  --   --   GLUCOSE  --   < > 71  --  79  < > 103*  < > 132* 101* 141* 144* 118*  BUN  --   < > 16  --  18  < > 21  < > 21 21 16 18 15   CREATININE 0.70  < > 0.88  --  1.03  < > 0.94  < > 0.89 0.80 0.62 0.70 0.70  CALCIUM  --   --  9.1  --  8.7  --  8.2*  --  8.0*  --  7.7*  --   --   MG 1.6  --  1.5  --   --   --  1.2*  --  1.3*  --  1.8  --   --   < > = values in this interval not displayed.  Liver Function Tests:  Recent Labs Lab 10/03/14 0509 10/06/14 0315 10/06/14 0926 10/07/14 0409 10/08/14 0400  AST 50* 26 31 33 26  ALT 53 17 19 17 18   ALKPHOS 216* 71 62 64 92  BILITOT 0.9 2.7* 3.9* 2.3* 1.4*  PROT 5.9* 4.8* 4.5* 4.1* 4.3*  ALBUMIN 2.1* 3.1* 2.8* 2.2* 1.9*   No results for input(s): LIPASE, AMYLASE in the last 168 hours. No results for input(s): AMMONIA in the last 168 hours.  CBC:  Recent Labs Lab 10/07/14 0200 10/07/14 0409  10/07/14 1540 10/08/14 0400 10/08/14 0410 10/08/14 1512 10/08/14 1605  WBC 26.1* 26.0*  --  23.0* 20.1*  --   --  21.9*  NEUTROABS  --  22.6*  --   --   --   --   --   --   HGB 8.9* 8.5*  < > 8.9* 8.9* 8.8* 9.9* 9.0*  HCT 25.6* 24.0*  < > 25.2* 26.2* 26.0* 29.0* 26.4*  MCV 84.5 85.1  --  85.1 87.3  --   --  89.5  PLT 214 198  --  193 182  --   --  170  < > = values in this interval not  displayed.  INR:  Recent Labs Lab 10/05/14 2000 10/06/14 0719 10/06/14 0930 10/07/14 0409 10/08/14 0400  INR 1.04 1.10 1.26 1.47 1.33    Other results:  EKG:   Imaging: Dg Abd 1 View  10/08/2014   CLINICAL DATA:  69 year old male for feeding tube advancement.  EXAM: ABDOMEN - 1 VIEW  COMPARISON:  Prior studies  FINDINGS: A small bore feeding tube was advanced, with tip at the duodenal/jejunal junction, confirmed by contrast administration.  IMPRESSION: Advancement of small bore feeding tube with tip at the duodenal/jejunal junction.   Electronically Signed   By: Margarette Canada M.D.   On: 10/08/2014 13:37   Dg Chest Port 1 View  10/08/2014   CLINICAL DATA:  Left ventricular assist device. Feeding tube dysfunction.  EXAM: PORTABLE CHEST - 1 VIEW  COMPARISON:  10/07/2014.  10/06/2014.  FINDINGS: Endotracheal tube, NG tube, feeding tube, right PICC line, bilateral chest tubes, Swan-Ganz catheter in stable positions. Feeding tube may be slightly cold in the throat. Left ventricular assist device noted. Prior median sternotomy. Stable cardiomegaly. No pulmonary venous congestion. Persistent atelectasis and/or infiltrate right lung base. No prominent pleural effusion. No pneumothorax.  IMPRESSION: 1. Lines and tubes in stable position. Feeding tube may be slightly coiled in throat. 2. Prior median sternotomy. Left ventricular assist device again noted. Stable cardiomegaly. 3. Persistent atelectasis and/or infiltrate right lung base.   Electronically Signed   By: Theodosia   On: 10/08/2014 08:04   Dg Chest Port 1 View  10/07/2014   CLINICAL DATA:  Post intubation.  Left ventricular assist device.  EXAM: PORTABLE CHEST - 1 VIEW  COMPARISON:  10/06/2014  FINDINGS: Postoperative changes in the mediastinum. Left ventricular assist device present. Endotracheal tube tip measures 4.8 cm above the carinal. Enteric tube tip is in the left upper quadrant consistent with location in the upper stomach.  Bilateral chest tubes and mediastinal drains are present. Swan-Ganz catheter with tip over the pulmonary outflow  tract. Right PICC line with tip over the low SVC region. No pneumothorax. Consolidation in the right lung base is slightly increased since previous study. Blunting of the costophrenic angles suggests small effusions.  IMPRESSION: Appliances are unchanged in satisfactory position. Mild increase of consolidation in the right lung base. Probable small pleural effusions.   Electronically Signed   By: Lucienne Capers M.D.   On: 10/07/2014 03:25   Dg Abd Portable 1v  10/08/2014   CLINICAL DATA:  Dysfunctional feeding tube  EXAM: PORTABLE ABDOMEN - 1 VIEW  COMPARISON:  10/07/2014  FINDINGS: Scattered large and small bowel gas is noted. A left ventricular assist device is seen. A feeding catheter and nasogastric catheter are again noted and stable. The feeding catheter may be coiled upon itself within the throat proximally given findings on recent chest x-ray. Chest tubes are noted on the left. Postsurgical changes are seen consistent with the given clinical history.  IMPRESSION: Feeding catheter appears within normal limits on this abdominal film. Correlation with the recently obtained chest x-ray suggest some coiling within the throat. Clinical correlation is recommended. Additional imaging may be helpful.   Electronically Signed   By: Inez Catalina M.D.   On: 10/08/2014 08:00   Dg Abd Portable 1v  10/07/2014   CLINICAL DATA:  Feeding tube placement  EXAM: PORTABLE ABDOMEN - 1 VIEW  COMPARISON:  September 27, 2014  FINDINGS: Feeding tube tip is in the stomach. Nasogastric tube tip and side port are also in the stomach. The bowel gas pattern is unremarkable. No obstruction or free air is seen on this supine examination. There is a left ventricular assist device present. There is a filter in the inferior vena cava with the apex directed superiorly at the level of L2.  IMPRESSION: Nasogastric tube tip and side  port as well as feeding tube tip are in the stomach region. Bowel gas pattern is unremarkable.   Electronically Signed   By: Lowella Grip III M.D.   On: 10/07/2014 13:16   Dg Addison Bailey G Tube Plc W/fl-no Rad  10/08/2014   CLINICAL DATA:    NASO G TUBE PLACEMENT WITH FLUORO  Fluoroscopy was utilized by the requesting physician.  No radiographic  interpretation.      Medications:     Scheduled Medications: . acetaminophen  1,000 mg Oral 4 times per day   Or  . acetaminophen (TYLENOL) oral liquid 160 mg/5 mL  1,000 mg Per Tube 4 times per day  . acetaminophen (TYLENOL) oral liquid 160 mg/5 mL  650 mg Per Tube Once   Or  . acetaminophen  650 mg Rectal Once  . amiodarone  150 mg Intravenous Once  . antiseptic oral rinse  7 mL Mouth Rinse QID  . budesonide-formoterol  2 puff Inhalation BID  . chlorhexidine  15 mL Mouth Rinse BID  . insulin aspart  0-24 Units Subcutaneous 6 times per day  . insulin glargine  10 Units Subcutaneous BID  . metoCLOPramide (REGLAN) injection  10 mg Intravenous 4 times per day  . pantoprazole sodium  40 mg Per Tube QHS  . piperacillin-tazobactam (ZOSYN)  IV  3.375 g Intravenous 3 times per day  . potassium chloride  10 mEq Intravenous Q1 Hr x 2  . sodium chloride  10 mL Intravenous Q12H  . vancomycin  1,000 mg Intravenous Q12H  . warfarin  2.5 mg Oral q1800  . Warfarin - Physician Dosing Inpatient   Does not apply q1800    Infusions: .  sodium chloride 10 mL/hr at 10/08/14 1530  . amiodarone 30 mg/hr (10/08/14 1100)  . dexmedetomidine 0.7 mcg/kg/hr (10/08/14 1600)  . epinephrine 5 mcg/min (10/08/14 1530)  . feeding supplement (VITAL 1.5 CAL) 1,000 mL (10/08/14 1000)  . fentaNYL infusion INTRAVENOUS 50 mcg/hr (10/08/14 1400)  . furosemide (LASIX) infusion 6 mg/hr (10/08/14 0845)  . lactated ringers 20 mL/hr at 10/06/14 0543  . lactated ringers 20 mL/hr at 10/07/14 1928  . milrinone 0.25 mcg/kg/min (10/08/14 1300)  . norepinephrine (LEVOPHED) Adult  infusion 12 mcg/min (10/08/14 0900)    PRN Medications: sodium chloride, fentaNYL, levalbuterol, midazolam, ondansetron (ZOFRAN) IV, oxyCODONE, sodium chloride, traMADol   Assessment:  Stable hemodynamics- , VAD speed 9200 Resting patient  after re-exploration for bleeding- removing fluid before vent wean  Cont IV Zosyn for possible pneumonia, empyema from past pulmonary infarct Cont vancomycin postop 48 hrs  Plan/Discussion:    Remove drains as indicated- 3 out today Vent wean after 48 hrs diuresis TF nutrition Resume anticoagulation slowly- inr target 1.5-2.0 Patients condition updated and plan of care discussed daily with wife    I reviewed the LVAD parameters from today, and compared the results to the patient's prior recorded data.  No programming changes were made.  The LVAD is functioning within specified parameters.  The patient performs LVAD self-test daily.  LVAD interrogation was negative for any significant power changes, alarms or PI events/speed drops.  LVAD equipment check completed and is in good working order.  Back-up equipment present.   LVAD education done on emergency procedures and precautions and reviewed exit site care.  Length of Stay: [place] III 10/08/2014, 5:48 PM

## 2014-10-08 NOTE — Progress Notes (Signed)
200cc Fentanyl wasted with Lily Kocher, RN. per MD [name] order to stop Fentanyl after Nitric weaned to 5ppm. Anagha Loseke L

## 2014-10-08 NOTE — Progress Notes (Signed)
PT Cancellation Note  Patient Details Name: Scott [name] MRN: [id number] DOB: [birth year]   Cancelled Treatment:    Reason Eval/Treat Not Completed: Patient not medically ready per RN.  Will try back each day until able to evaluate. 10/08/2014  Donnella Sham, [place] [phone]  (pager)   [name], [name] 10/08/2014, 9:48 AM

## 2014-10-08 NOTE — Progress Notes (Signed)
Admitted 09/10/14 with worsening heart failure and cardiogenic shock.  HeartMate II LVAD implated on 09/21/14 by Dr. Darcey Nora as DT VAD.   Vital signs: Temp: 98.2 - 98.6 F HR: 63 - 80 SR  Doppler MAP:  50 - 80 Arterial BP: 87-103/68 - 78  MAP: 71 - 87 PA Pressures: 29 - 38/11 - 17 CO/CI: 7.7/3.8 O2 Sat:  95 - 100  Wt in lbs:  173>194>194.7>192.7>187>184>184>182>188>187>181>189>190>205  LVAD interrogation reveals:  Speed:  9200 Flow: 4.9 Power: 5.4 PI: 6.5 Alarms: none Events: No PI events since 4/6 in early AM Fixed speed:  9200 Low speed limit: 8600  Blood products: OR:   09/21/14:  1 unit PC, 2 plts, 3 FFP 10/05/14:  3 PC's, 500 cell saver 10/06/14:  2 PCs, 2 cryo, 300 cell saver     ICU:  09/22/14:   1 unit PC 10/05/14:  2 PCs, 2 FFP 10/06/14:  5 PCs, 2 FFP, 1 Plt, 1 cryo, 3 Factro VII, DDAVP  Nitric Oxide: 15 PPM (weaning today)  Current gtts: Milinone:  0.25 mcg/kg/min Epi: 5 Levo: 14  ASA 325 mg:  LD 10/04/14  Warfarin: LD 10/03/14   Labs:  LDH trend: 364>496>380>341>375>398>379 350>352>351>311>322>305 > 371 > 275 > 246  INR trend: 1.42 > 1.44 > 1.6 > 1.84 > 2.02 > 1.81 > 2.47 > 2.64 >2.4>1.78>2.0>1.0>1.2 > 1.47 > 1.33  Co-Ox:  62.3% >64>65 > 70.3  WBC:  111.4 > 15.4 (Urine Culture sent) > 17.2 > 15.6 > 14.5>15.8>26.8>18.9>27.5 > 23.0 >  20.1  Hgb:  9.0 > 8.8 > 8.0 > 7.9 > 8.2 > 9.3> 8.7 > 8.6 > 7.6 > 8.5>7.3>9.9>8.4>8.5 > 8.9  Drive Line:  Dressing clean, dry and intact. Planning on changing with RN later this afternoon.   Plan/Recommendations and Discharge Needs as discussed with VAD team:  1.  D/Cing 3 chest tubes this morning. CT output much improved. No further products transfused since 10/07/14 early AM.   2. Weaning NO this morning. Vent stable, RASS 0 to -1 on current analgesia/sedation. Currently restrained for airway protection.  4. WBC trending down - urine culture negative (final) & sputum culture NGTD.  Pt on Vanc and Zosyn.   5.  Lasix gtt  today at 6mg /h started.  6.  TF started and tolerating well for now.   7. No power elevations observed or sustained. Will continue to monitor while INR low d/t bleeding events.

## 2014-10-08 NOTE — Progress Notes (Signed)
Drive Line:  VAD dressing removed and site care performed using sterile technique. One suture remains intact, skin showing some open areas with one suture out d/t recent swelling and has serous drainage from boarders, edges mostly approximated distal to actual perc lead.  Exit site with decreasing erythema, small amount serous drainage, no foul odor. The velour is fully implanted at exit site.   Dressing changed using Chlora prep applicators x 2, allowed site to dry, and gauze dressing with aquacel strip applied. Drive line anchor in place. Driveline dressing is being changed daily per sterile technique.      Plan/Recommendations and Discharge Needs as discussed with VAD team:   1. Allevyn dressings to blistered areas on R wrist to prevent further maceration. This site was noted to have sloughed skin and serous-white drainage, no odor and appears macerated. Cleansed with sterile saline and 4x4 gauze.   2. Continue to perc lead dressing daily and as needed with drainage.   3. Consider Interdry Ag+ if maceration continues in groins, as there are fluid blisters there presently.

## 2014-10-08 NOTE — Progress Notes (Signed)
UR Completed.  [phone]  

## 2014-10-08 NOTE — Progress Notes (Signed)
OT Cancellation Note  Patient Details Name: Scott [name] MRN: [id number] DOB: [birth year]   Cancelled Treatment:    Reason Eval/Treat Not Completed: Patient not medically ready.Will check back early next week.  [name] 10/08/2014, 10:19 AM  [phone]

## 2014-10-08 NOTE — Progress Notes (Signed)
Patient ID: Scott [name], male   DOB: 1946-07-02, 69 y.o.   MRN: [id number]  SICU Evening Rounds:  Hemodynamically stable today   On Milrinone 0.25, epi 5, levophed 11. NO weaned off today.  CI 3.1  Pump parameters look good  Excellent diuresis with lasix drip.  BMET    Component Value Date/Time   NA 136 10/08/2014 1512   K 3.5 10/08/2014 1512   CL 95* 10/08/2014 1512   CO2 31 10/08/2014 0400   GLUCOSE 118* 10/08/2014 1512   BUN 15 10/08/2014 1512   CREATININE 0.70 10/08/2014 1512   CALCIUM 7.7* 10/08/2014 0400   GFRNONAA >90 10/08/2014 0400   GFRAA >90 10/08/2014 0400    CBC    Component Value Date/Time   WBC 21.9* 10/08/2014 1605   RBC 2.95* 10/08/2014 1605   HGB 9.0* 10/08/2014 1605   HCT 26.4* 10/08/2014 1605   PLT 170 10/08/2014 1605   MCV 89.5 10/08/2014 1605   MCH 30.5 10/08/2014 1605   MCHC 34.1 10/08/2014 1605   RDW 16.6* 10/08/2014 1605   LYMPHSABS 0.8 10/07/2014 0409   MONOABS 2.6* 10/07/2014 0409   EOSABS 0.0 10/07/2014 0409   BASOSABS 0.0 10/07/2014 0409     alert on the vent. Restrained for safety.  Tolerating tube feeds at 50.

## 2014-10-09 ENCOUNTER — Other Ambulatory Visit: Payer: Self-pay

## 2014-10-09 ENCOUNTER — Inpatient Hospital Stay (HOSPITAL_COMMUNITY): Payer: Medicare Other

## 2014-10-09 LAB — GLUCOSE, CAPILLARY
GLUCOSE-CAPILLARY: 117 mg/dL — AB (ref 70–99)
GLUCOSE-CAPILLARY: 172 mg/dL — AB (ref 70–99)
Glucose-Capillary: 140 mg/dL — ABNORMAL HIGH (ref 70–99)
Glucose-Capillary: 140 mg/dL — ABNORMAL HIGH (ref 70–99)
Glucose-Capillary: 104 mg/dL — ABNORMAL HIGH (ref 70–99)
Glucose-Capillary: 139 mg/dL — ABNORMAL HIGH (ref 70–99)

## 2014-10-09 LAB — LACTATE DEHYDROGENASE: LDH: 280 U/L — ABNORMAL HIGH (ref 94–250)

## 2014-10-09 LAB — POCT I-STAT, CHEM 8
BUN: 17 mg/dL (ref 6–23)
CALCIUM ION: 1.15 mmol/L (ref 1.13–1.30)
Chloride: 94 mmol/L — ABNORMAL LOW (ref 96–112)
Creatinine, Ser: 0.7 mg/dL (ref 0.50–1.35)
GLUCOSE: 183 mg/dL — AB (ref 70–99)
HCT: 29 % — ABNORMAL LOW (ref 39.0–52.0)
Hemoglobin: 9.9 g/dL — ABNORMAL LOW (ref 13.0–17.0)
Potassium: 3.5 mmol/L (ref 3.5–5.1)
Sodium: 132 mmol/L — ABNORMAL LOW (ref 135–145)
TCO2: 30 mmol/L (ref 0–100)

## 2014-10-09 LAB — PREPARE FRESH FROZEN PLASMA
Unit division: 0
Unit division: 0
Unit division: 0
Unit division: 0

## 2014-10-09 LAB — POCT I-STAT 3, ART BLOOD GAS (G3+)
ACID-BASE EXCESS: 11 mmol/L — AB (ref 0.0–2.0)
BICARBONATE: 36 meq/L — AB (ref 20.0–24.0)
O2 SAT: 96 %
Patient temperature: 37.6
TCO2: 38 mmol/L (ref 0–100)
pCO2 arterial: 52.3 mmHg — ABNORMAL HIGH (ref 35.0–45.0)
pH, Arterial: 7.448 (ref 7.350–7.450)
pO2, Arterial: 85 mmHg (ref 80.0–100.0)

## 2014-10-09 LAB — CBC
HCT: 27.8 % — ABNORMAL LOW (ref 39.0–52.0)
Hemoglobin: 9.2 g/dL — ABNORMAL LOW (ref 13.0–17.0)
MCH: 29.9 pg (ref 26.0–34.0)
MCHC: 33.1 g/dL (ref 30.0–36.0)
MCV: 90.3 fL (ref 78.0–100.0)
Platelets: 186 10*3/uL (ref 150–400)
RBC: 3.08 MIL/uL — ABNORMAL LOW (ref 4.22–5.81)
RDW: 17.4 % — ABNORMAL HIGH (ref 11.5–15.5)
WBC: 19.9 10*3/uL — ABNORMAL HIGH (ref 4.0–10.5)

## 2014-10-09 LAB — PROTIME-INR
INR: 1.24 (ref 0.00–1.49)
Prothrombin Time: 15.8 seconds — ABNORMAL HIGH (ref 11.6–15.2)

## 2014-10-09 LAB — CARBOXYHEMOGLOBIN
CARBOXYHEMOGLOBIN: 1.1 % (ref 0.5–1.5)
METHEMOGLOBIN: 0.9 % (ref 0.0–1.5)
O2 SAT: 69.6 %
TOTAL HEMOGLOBIN: 10.3 g/dL — AB (ref 13.5–18.0)

## 2014-10-09 LAB — BASIC METABOLIC PANEL
Anion gap: 7 (ref 5–15)
BUN: 14 mg/dL (ref 6–23)
CO2: 32 mmol/L (ref 19–32)
Calcium: 7.4 mg/dL — ABNORMAL LOW (ref 8.4–10.5)
Chloride: 95 mmol/L — ABNORMAL LOW (ref 96–112)
Creatinine, Ser: 0.51 mg/dL (ref 0.50–1.35)
GFR calc Af Amer: >90 mL/min (ref >90)
GFR calc non Af Amer: >90 mL/min (ref >90)
Glucose, Bld: 123 mg/dL — ABNORMAL HIGH (ref 70–99)
Potassium: 4.2 mmol/L (ref 3.5–5.1)
Sodium: 134 mmol/L — ABNORMAL LOW (ref 135–145)

## 2014-10-09 LAB — MAGNESIUM: Magnesium: 1.6 mg/dL (ref 1.5–2.5)

## 2014-10-09 MED ORDER — WARFARIN SODIUM 2.5 MG PO TABS: 2.5000 mg | ORAL_TABLET | Freq: Every day | ORAL | Status: DC

## 2014-10-09 MED ORDER — POTASSIUM CHLORIDE 10 MEQ/50ML IV SOLN
10.0000 meq | INTRAVENOUS | Status: AC
  Administered 2014-10-09 (×5): 10 meq via INTRAVENOUS
  Filled 2014-10-09: qty 50

## 2014-10-09 MED ORDER — POTASSIUM CHLORIDE 10 MEQ/50ML IV SOLN
10.0000 meq | INTRAVENOUS | Status: AC
  Administered 2014-10-09 (×5): 10 meq via INTRAVENOUS

## 2014-10-09 MED ORDER — AMIODARONE LOAD VIA INFUSION
150.0000 mg | Freq: Once | INTRAVENOUS | Status: AC
  Administered 2014-10-09: 150 mg via INTRAVENOUS
  Filled 2014-10-09: qty 83.34

## 2014-10-09 MED ORDER — AMIODARONE HCL IN DEXTROSE 360-4.14 MG/200ML-% IV SOLN
30.0000 mg/h | INTRAVENOUS | Status: DC
  Administered 2014-10-09 – 2014-10-11 (×6): 30 mg/h via INTRAVENOUS
  Filled 2014-10-09 (×14): qty 200

## 2014-10-09 MED ORDER — AMIODARONE HCL IN DEXTROSE 360-4.14 MG/200ML-% IV SOLN
60.0000 mg/h | INTRAVENOUS | Status: AC
  Administered 2014-10-09: 60 mg/h via INTRAVENOUS
  Filled 2014-10-09: qty 200

## 2014-10-09 NOTE — Progress Notes (Signed)
HeartMate 2 Rounding Note  Subjective:   POD #18   VAD implant  (DT)  For acute /chronic systolic HF from ischemic cardiomyopathy and cardiogenic shock on preop IABP  POD#4 re-exploration for bleeding - coagulopathy/ oozing in pump pocket- all pump anastomoses dry  Prior hx PE- with large R pulmonary infarct,necrotic    lung/pneumonia- poss empyema on IV Zosyn- brown sputum Restrictive lung disease- FEV1 1.3 from ankylosing spondylitis and CO2 retention DM  HX ventricular, atrial arrhythmia Postoperative CO2 retention requiring BiPAP  Postoperative coagulopathy associated with INR 2.7  No sig chest tube output in last  24 hr  Stable hemodynamics on medium dose inotropes 2D echo  with good RV fx, LV decompressed by VAD Not ready for extubation-  inhaled NO weaned off,        edema better with diuresis Diuresing today with lasix drip 4 mg Panda placed and TF nutrition started- up to 60/hr Low dose coumadin started 2.5 daily  HeartMate II LVAD:  Flow 4.5 liters/min, speed 9200 rpm power 5.1, PI  5.0  Controller intact  Objective:    Vital Signs:   Temp:  [98.2 F (36.8 C)-99.7 F (37.6 C)] 98.6 F (37 C) (04/09 0945) Pulse Rate:  [29-139] 72 (04/09 0945) Resp:  [12-26] 17 (04/09 0945) BP: (81-99)/(67-74) 81/67 mmHg (04/08 1955) SpO2:  [96 %-100 %] 99 % (04/09 0945) Arterial Line BP: (66-118)/(59-78) 118/75 mmHg (04/09 0945) FiO2 (%):  [40 %] 40 % (04/09 0800) Weight:  [199 lb 11.8 oz (90.6 kg)] 199 lb 11.8 oz (90.6 kg) (04/09 0600) Last BM Date: 10/04/14 Mean arterial Pressure 78  Intake/Output:   Intake/Output Summary (Last 24 hours) at 10/09/14 1028 Last data filed at 10/09/14 1000  Gross per 24 hour  Intake 4293.09 ml  Output   9265 ml  Net -4971.91 ml     Physical Exam: General:sedated on vent HEENT: normal Neck: supple. JVP . Carotids 2+ bilat; no bruits. No lymphadenopathy or thryomegaly appreciated. Cor: Mechanical heart sounds with LVAD hum present. Lungs:  bibasilar rales Abdomen: soft, nontender, nondistended. No hepatosplenomegaly. No bruits or masses. Good bowel sounds. Driveline: C/D/I; securement device intact and driveline incorporated Extremities: warm well perfused,no cyanosis, clubbing, rash, edema- Neuro: responsive when sedation wears off Sternal incision dry   Telemetry:nsr  Labs: Basic Metabolic Panel:  Recent Labs Lab 10/06/14 0315  10/06/14 0926  10/06/14 1500  10/07/14 0409 10/07/14 1537 10/08/14 0400 10/08/14 0410 10/08/14 1512 10/09/14 0037 10/09/14 0625  NA 139  < > 140  < > 139  < > 138 137 136 137 136 132*  --   K 3.8  < > 4.0  < > 4.0  < > 4.1 3.4* 3.6 3.6 3.5 3.5  --   CL 104  --  104  < > 103  < > 104 98 101 95* 95* 94*  --   CO2 29  --  29  --  29  --  23  --  31  --   --   --   --   GLUCOSE 71  --  79  < > 103*  < > 132* 101* 141* 144* 118* 183*  --   BUN 16  --  18  < > 21  < > 21 21 16 18 15 17   --   CREATININE 0.88  --  1.03  < > 0.94  < > 0.89 0.80 0.62 0.70 0.70 0.70  --   CALCIUM 9.1  --  8.7  --  8.2*  --  8.0*  --  7.7*  --   --   --   --   MG 1.5  --   --   --  1.2*  --  1.3*  --  1.8  --   --   --  1.6  < > = values in this interval not displayed.  Liver Function Tests:  Recent Labs Lab 10/03/14 0509 10/06/14 0315 10/06/14 0926 10/07/14 0409 10/08/14 0400  AST 50* 26 31 33 26  ALT 53 17 19 17 18   ALKPHOS 216* 71 62 64 92  BILITOT 0.9 2.7* 3.9* 2.3* 1.4*  PROT 5.9* 4.8* 4.5* 4.1* 4.3*  ALBUMIN 2.1* 3.1* 2.8* 2.2* 1.9*   No results for input(s): LIPASE, AMYLASE in the last 168 hours. No results for input(s): AMMONIA in the last 168 hours.  CBC:  Recent Labs Lab 10/07/14 0200 10/07/14 0409  10/07/14 1540 10/08/14 0400 10/08/14 0410 10/08/14 1512 10/08/14 1605 10/09/14 0037  WBC 26.1* 26.0*  --  23.0* 20.1*  --   --  21.9*  --   NEUTROABS  --  22.6*  --   --   --   --   --   --   --   HGB 8.9* 8.5*  < > 8.9* 8.9* 8.8* 9.9* 9.0* 9.9*  HCT 25.6* 24.0*  < > 25.2* 26.2*  26.0* 29.0* 26.4* 29.0*  MCV 84.5 85.1  --  85.1 87.3  --   --  89.5  --   PLT 214 198  --  193 182  --   --  170  --   < > = values in this interval not displayed.  INR:  Recent Labs Lab 10/06/14 0719 10/06/14 0930 10/07/14 0409 10/08/14 0400 10/09/14 0625  INR 1.10 1.26 1.47 1.33 1.24    Other results:  EKG:   Imaging: Dg Abd 1 View  10/08/2014   CLINICAL DATA:  69 year old male for feeding tube advancement.  EXAM: ABDOMEN - 1 VIEW  COMPARISON:  Prior studies  FINDINGS: A small bore feeding tube was advanced, with tip at the duodenal/jejunal junction, confirmed by contrast administration.  IMPRESSION: Advancement of small bore feeding tube with tip at the duodenal/jejunal junction.   Electronically Signed   By: Margarette Canada M.D.   On: 10/08/2014 13:37   Dg Chest Port 1 View  10/09/2014   CLINICAL DATA:  LVAD placement  EXAM: PORTABLE CHEST - 1 VIEW  COMPARISON:  10/08/2014  FINDINGS: Endotracheal to, NG tube, Swan-Ganz catheter, and right PICC line are unchanged. Right chest tube in place without pneumothorax. There is focus of right lower lobe infiltrate again noted. LVAD device in place noted. There is a chest tube at the left lung base. Interval removal of 2 left chest tubes and 1 left chest tube remaining.  IMPRESSION: 1. Interval removal of 2 left chest tubes without pneumothorax. 2. Right chest tube remains with small right lower lobe infiltrate. No pneumothorax. 3. Otherwise stable support apparatus and overall no significant change.   Electronically Signed   By: Suzy Bouchard M.D.   On: 10/09/2014 08:38   Dg Chest Port 1 View  10/08/2014   CLINICAL DATA:  Left ventricular assist device. Feeding tube dysfunction.  EXAM: PORTABLE CHEST - 1 VIEW  COMPARISON:  10/07/2014.  10/06/2014.  FINDINGS: Endotracheal tube, NG tube, feeding tube, right PICC line, bilateral chest tubes, Swan-Ganz catheter in stable positions. Feeding tube may be slightly cold in  the throat. Left ventricular  assist device noted. Prior median sternotomy. Stable cardiomegaly. No pulmonary venous congestion. Persistent atelectasis and/or infiltrate right lung base. No prominent pleural effusion. No pneumothorax.  IMPRESSION: 1. Lines and tubes in stable position. Feeding tube may be slightly coiled in throat. 2. Prior median sternotomy. Left ventricular assist device again noted. Stable cardiomegaly. 3. Persistent atelectasis and/or infiltrate right lung base.   Electronically Signed   By: Marcello Moores  Register   On: 10/08/2014 08:04   Dg Abd Portable 1v  10/08/2014   CLINICAL DATA:  Dysfunctional feeding tube  EXAM: PORTABLE ABDOMEN - 1 VIEW  COMPARISON:  10/07/2014  FINDINGS: Scattered large and small bowel gas is noted. A left ventricular assist device is seen. A feeding catheter and nasogastric catheter are again noted and stable. The feeding catheter may be coiled upon itself within the throat proximally given findings on recent chest x-ray. Chest tubes are noted on the left. Postsurgical changes are seen consistent with the given clinical history.  IMPRESSION: Feeding catheter appears within normal limits on this abdominal film. Correlation with the recently obtained chest x-ray suggest some coiling within the throat. Clinical correlation is recommended. Additional imaging may be helpful.   Electronically Signed   By: Inez Catalina M.D.   On: 10/08/2014 08:00   Dg Abd Portable 1v  10/07/2014   CLINICAL DATA:  Feeding tube placement  EXAM: PORTABLE ABDOMEN - 1 VIEW  COMPARISON:  September 27, 2014  FINDINGS: Feeding tube tip is in the stomach. Nasogastric tube tip and side port are also in the stomach. The bowel gas pattern is unremarkable. No obstruction or free air is seen on this supine examination. There is a left ventricular assist device present. There is a filter in the inferior vena cava with the apex directed superiorly at the level of L2.  IMPRESSION: Nasogastric tube tip and side port as well as feeding tube tip  are in the stomach region. Bowel gas pattern is unremarkable.   Electronically Signed   By: Lowella Grip III M.D.   On: 10/07/2014 13:16   Dg Addison Bailey G Tube Plc W/fl-no Rad  10/08/2014   CLINICAL DATA:    NASO G TUBE PLACEMENT WITH FLUORO  Fluoroscopy was utilized by the requesting physician.  No radiographic  interpretation.      Medications:     Scheduled Medications: . acetaminophen  1,000 mg Oral 4 times per day   Or  . acetaminophen (TYLENOL) oral liquid 160 mg/5 mL  1,000 mg Per Tube 4 times per day  . acetaminophen (TYLENOL) oral liquid 160 mg/5 mL  650 mg Per Tube Once   Or  . acetaminophen  650 mg Rectal Once  . amiodarone  150 mg Intravenous Once  . antiseptic oral rinse  7 mL Mouth Rinse QID  . budesonide-formoterol  2 puff Inhalation BID  . chlorhexidine  15 mL Mouth Rinse BID  . insulin aspart  0-24 Units Subcutaneous 6 times per day  . insulin glargine  10 Units Subcutaneous BID  . metoCLOPramide (REGLAN) injection  10 mg Intravenous 4 times per day  . pantoprazole sodium  40 mg Per Tube QHS  . piperacillin-tazobactam (ZOSYN)  IV  3.375 g Intravenous 3 times per day  . sodium chloride  10 mL Intravenous Q12H  . vancomycin  1,000 mg Intravenous Q12H  . warfarin  2.5 mg Oral q1800  . Warfarin - Physician Dosing Inpatient   Does not apply q1800    Infusions: .  sodium chloride 20 mL (10/09/14 0517)  . amiodarone 30 mg/hr (10/09/14 0646)  . dexmedetomidine 0.7 mcg/kg/hr (10/09/14 0600)  . epinephrine 5 mcg/min (10/09/14 0600)  . feeding supplement (VITAL 1.5 CAL) 1,000 mL (10/08/14 1830)  . fentaNYL infusion INTRAVENOUS 50 mcg/hr (10/09/14 0600)  . furosemide (LASIX) infusion 4 mg/hr (10/09/14 0600)  . lactated ringers 20 mL/hr at 10/06/14 0543  . lactated ringers 20 mL/hr at 10/09/14 0517  . milrinone 0.25 mcg/kg/min (10/09/14 0600)  . norepinephrine (LEVOPHED) Adult infusion 12 mcg/min (10/09/14 0600)    PRN Medications: sodium chloride, fentaNYL,  levalbuterol, midazolam, ondansetron (ZOFRAN) IV, oxyCODONE, sodium chloride, traMADol   Assessment:  Stable hemodynamics- , VAD speed 9200 Resting patient  after re-exploration for bleeding- removing fluid before vent wean- hopefully will be ready tomorrow  Cont IV Zosyn for possible pneumonia, empyema from past pulmonary infarct Cont vancomycin postop 48 hrs Cont low dose coumadin Plan/Discussion:    Remove drains as indicated- 2 more out today Vent wean after another 24 hr hrs diuresis TF nutrition Resume anticoagulation slowly- inr target 1.5-2.0 Patients condition updated and plan of care discussed daily with wife    I reviewed the LVAD parameters from today, and compared the results to the patient's prior recorded data.  No programming changes were made.  The LVAD is functioning within specified parameters.  The patient performs LVAD self-test daily.  LVAD interrogation was negative for any significant power changes, alarms or PI events/speed drops.  LVAD equipment check completed and is in good working order.  Back-up equipment present.   LVAD education done on emergency procedures and precautions and reviewed exit site care.  Length of Stay: [place] [facility] 10/09/2014, 10:28 AM

## 2014-10-09 NOTE — Progress Notes (Signed)
Patient ID: Scott [name], male   DOB: June 21, 1946, 69 y.o.   MRN: [id number]  SICU Evening Rounds:  Hemodynamics stable today on same drips. PAD 14, CVP 8  Rhythm has been fluctuating between A-fib with RVR 120's, sinus and junctional. Remains on amio.  Diuresing well on lasix drip  BMET    Component Value Date/Time   NA 134* 10/09/2014 1600   K 4.2 10/09/2014 1600   CL 95* 10/09/2014 1600   CO2 32 10/09/2014 1600   GLUCOSE 123* 10/09/2014 1600   BUN 14 10/09/2014 1600   CREATININE 0.51 10/09/2014 1600   CALCIUM 7.4* 10/09/2014 1600   GFRNONAA >90 10/09/2014 1600   GFRAA >90 10/09/2014 1600    Continue current course.

## 2014-10-09 NOTE — Progress Notes (Signed)
Pt heart rate noted to convert from afib 110-130s to SR 70-80s.  PI noted to increase to 8.1 and pt MAP increased to 96.  Paged VAD pager and was referred to Dr. Aundra Dubin.  Paged Dr. Aundra Dubin and advised him of pt status.  Dr. Aundra Dubin advised OK to titrate Epi first then Levo and get EKG.  Returned Epi to previous rate of 4.  EKG indicates NSR.  MAP returned to 76 and PI now 6.3.  Will continue to monitor very closely.

## 2014-10-09 NOTE — Progress Notes (Signed)
PT Cancellation Note  Patient Details Name: Scott [name] MRN: [id number] DOB: [birth year]   Cancelled Treatment:    Reason Eval/Treat Not Completed: Medical issues which prohibited therapy (Nursing states to HOLD PT through [facility])   [place], [place] F 10/09/2014, 9:49 AM [facility] [phone] (pager)

## 2014-10-09 NOTE — Progress Notes (Signed)
Spoke with Dr. Cyndia Bent regarding patient converting to a-fib RVR with rate as high as 160. Order received for Amiodarone bolus and to follow protocol. Updated on lower PIs now that he is in a-fib. Also noted that he has had 3 liters in urine output. Order received to decrease Lasix to 4mg /hr. Follow-up potassium 3.5. Order received to give 5 runs of IV potassium replacement. Will continue to closely monitor. Richarda Blade RN

## 2014-10-09 NOTE — Progress Notes (Signed)
Patient ID: Scott [name], male   DOB: Nov 02, 1945, 69 y.o.   MRN: [id number]  HeartMate 2 Rounding Note  Subjective:    S/P HMII LVAD on 09/20/2013.   Moved to ICU  4/5 due to low MAP and low hemoglobin. Taken Back to the OR 4/5 and 4/6  for recurrent bleed and evacuation of clots from left chest.   Nitric oxide off.  Currently on milrinone at 0.25 mcg, epi at 5 mcg, levo at 10,  lasix drip at 4 mg per hour, and amio 30 mg per hour. Had run atrial fibrillation RVR overnight, converted to NSR with amiodarone bolus.  Diuresed very well yesterday and Lasix gtt decreased. Creatinine stable.  Remains intubated. Awake.   Coumadin restarted yesterday at low dose, aiming for INR 1.5-2.   PAP 26/14, CVP 6 CI 2.3  LVAD INTERROGATION:  HeartMate II LVAD:  Flow 5.1 liters/min, speed 9200 power 5.6 , PI 5.4, no PI events.   Objective:    Vital Signs:   Temp:  [98.4 F (36.9 C)-99.7 F (37.6 C)] 98.8 F (37.1 C) (04/09 1122) Pulse Rate:  [29-139] 72 (04/09 0945) Resp:  [12-26] 17 (04/09 0945) BP: (81-99)/(67-71) 81/67 mmHg (04/08 1955) SpO2:  [96 %-100 %] 99 % (04/09 0945) Arterial Line BP: (66-118)/(59-78) 118/75 mmHg (04/09 0945) FiO2 (%):  [40 %] 40 % (04/09 0800) Weight:  [199 lb 11.8 oz (90.6 kg)] 199 lb 11.8 oz (90.6 kg) (04/09 0600) Last BM Date: 10/04/14 Mean arterial Pressure  70-80s  Intake/Output:   Intake/Output Summary (Last 24 hours) at 10/09/14 1205 Last data filed at 10/09/14 1000  Gross per 24 hour  Intake 3708.69 ml  Output   8800 ml  Net -5091.31 ml     Physical Exam: CVP 12 General: Intubated. Pale  HEENT: normal Neck: supple. Carotids 2+ bilat; no bruits. No lymphadenopathy or thryomegaly appreciated. R neck Swan  Cor: Mechanical heart sounds with LVAD hum present. 7 chest tubes in place Lungs: clear Abdomen: soft, nontender, nondistended. No hepatosplenomegaly. No bruits or masses. Good bowel sounds. Driveline: Dressing looks good.  Extremities: no  cyanosis, clubbing, rash.  3+ edema.  Neuro: Sedated on vent.  GU: foley   Telemetry: ST 110s    Labs: Basic Metabolic Panel:  Recent Labs Lab 10/06/14 0315  10/06/14 0926  10/06/14 1500  10/07/14 0409 10/07/14 1537 10/08/14 0400 10/08/14 0410 10/08/14 1512 10/09/14 0037 10/09/14 0625  NA 139  < > 140  < > 139  < > 138 137 136 137 136 132*  --   K 3.8  < > 4.0  < > 4.0  < > 4.1 3.4* 3.6 3.6 3.5 3.5  --   CL 104  --  104  < > 103  < > 104 98 101 95* 95* 94*  --   CO2 29  --  29  --  29  --  23  --  31  --   --   --   --   GLUCOSE 71  --  79  < > 103*  < > 132* 101* 141* 144* 118* 183*  --   BUN 16  --  18  < > 21  < > 21 21 16 18 15 17   --   CREATININE 0.88  --  1.03  < > 0.94  < > 0.89 0.80 0.62 0.70 0.70 0.70  --   CALCIUM 9.1  --  8.7  --  8.2*  --  8.0*  --  7.7*  --   --   --   --   MG 1.5  --   --   --  1.2*  --  1.3*  --  1.8  --   --   --  1.6  < > = values in this interval not displayed.  Liver Function Tests:  Recent Labs Lab 10/03/14 0509 10/06/14 0315 10/06/14 0926 10/07/14 0409 10/08/14 0400  AST 50* 26 31 33 26  ALT 53 17 19 17 18   ALKPHOS 216* 71 62 64 92  BILITOT 0.9 2.7* 3.9* 2.3* 1.4*  PROT 5.9* 4.8* 4.5* 4.1* 4.3*  ALBUMIN 2.1* 3.1* 2.8* 2.2* 1.9*   No results for input(s): LIPASE, AMYLASE in the last 168 hours. No results for input(s): AMMONIA in the last 168 hours.  CBC:  Recent Labs Lab 10/07/14 0200 10/07/14 0409  10/07/14 1540 10/08/14 0400 10/08/14 0410 10/08/14 1512 10/08/14 1605 10/09/14 0037  WBC 26.1* 26.0*  --  23.0* 20.1*  --   --  21.9*  --   NEUTROABS  --  22.6*  --   --   --   --   --   --   --   HGB 8.9* 8.5*  < > 8.9* 8.9* 8.8* 9.9* 9.0* 9.9*  HCT 25.6* 24.0*  < > 25.2* 26.2* 26.0* 29.0* 26.4* 29.0*  MCV 84.5 85.1  --  85.1 87.3  --   --  89.5  --   PLT 214 198  --  193 182  --   --  170  --   < > = values in this interval not displayed.  INR:  Recent Labs Lab 10/06/14 0719 10/06/14 0930 10/07/14 0409  10/08/14 0400 10/09/14 0625  INR 1.10 1.26 1.47 1.33 1.24    Other results:    Imaging: Dg Abd 1 View  10/08/2014   CLINICAL DATA:  69 year old male for feeding tube advancement.  EXAM: ABDOMEN - 1 VIEW  COMPARISON:  Prior studies  FINDINGS: A small bore feeding tube was advanced, with tip at the duodenal/jejunal junction, confirmed by contrast administration.  IMPRESSION: Advancement of small bore feeding tube with tip at the duodenal/jejunal junction.   Electronically Signed   By: Margarette Canada M.D.   On: 10/08/2014 13:37   Dg Chest Port 1 View  10/09/2014   CLINICAL DATA:  LVAD placement  EXAM: PORTABLE CHEST - 1 VIEW  COMPARISON:  10/08/2014  FINDINGS: Endotracheal to, NG tube, Swan-Ganz catheter, and right PICC line are unchanged. Right chest tube in place without pneumothorax. There is focus of right lower lobe infiltrate again noted. LVAD device in place noted. There is a chest tube at the left lung base. Interval removal of 2 left chest tubes and 1 left chest tube remaining.  IMPRESSION: 1. Interval removal of 2 left chest tubes without pneumothorax. 2. Right chest tube remains with small right lower lobe infiltrate. No pneumothorax. 3. Otherwise stable support apparatus and overall no significant change.   Electronically Signed   By: Suzy Bouchard M.D.   On: 10/09/2014 08:38   Dg Chest Port 1 View  10/08/2014   CLINICAL DATA:  Left ventricular assist device. Feeding tube dysfunction.  EXAM: PORTABLE CHEST - 1 VIEW  COMPARISON:  10/07/2014.  10/06/2014.  FINDINGS: Endotracheal tube, NG tube, feeding tube, right PICC line, bilateral chest tubes, Swan-Ganz catheter in stable positions. Feeding tube may be slightly cold in the throat. Left ventricular assist device noted. Prior median sternotomy. Stable cardiomegaly. No  pulmonary venous congestion. Persistent atelectasis and/or infiltrate right lung base. No prominent pleural effusion. No pneumothorax.  IMPRESSION: 1. Lines and tubes in stable  position. Feeding tube may be slightly coiled in throat. 2. Prior median sternotomy. Left ventricular assist device again noted. Stable cardiomegaly. 3. Persistent atelectasis and/or infiltrate right lung base.   Electronically Signed   By: Marcello Moores  Register   On: 10/08/2014 08:04   Dg Abd Portable 1v  10/08/2014   CLINICAL DATA:  Dysfunctional feeding tube  EXAM: PORTABLE ABDOMEN - 1 VIEW  COMPARISON:  10/07/2014  FINDINGS: Scattered large and small bowel gas is noted. A left ventricular assist device is seen. A feeding catheter and nasogastric catheter are again noted and stable. The feeding catheter may be coiled upon itself within the throat proximally given findings on recent chest x-ray. Chest tubes are noted on the left. Postsurgical changes are seen consistent with the given clinical history.  IMPRESSION: Feeding catheter appears within normal limits on this abdominal film. Correlation with the recently obtained chest x-ray suggest some coiling within the throat. Clinical correlation is recommended. Additional imaging may be helpful.   Electronically Signed   By: Inez Catalina M.D.   On: 10/08/2014 08:00   Dg Abd Portable 1v  10/07/2014   CLINICAL DATA:  Feeding tube placement  EXAM: PORTABLE ABDOMEN - 1 VIEW  COMPARISON:  September 27, 2014  FINDINGS: Feeding tube tip is in the stomach. Nasogastric tube tip and side port are also in the stomach. The bowel gas pattern is unremarkable. No obstruction or free air is seen on this supine examination. There is a left ventricular assist device present. There is a filter in the inferior vena cava with the apex directed superiorly at the level of L2.  IMPRESSION: Nasogastric tube tip and side port as well as feeding tube tip are in the stomach region. Bowel gas pattern is unremarkable.   Electronically Signed   By: Lowella Grip III M.D.   On: 10/07/2014 13:16   Dg Addison Bailey G Tube Plc W/fl-no Rad  10/08/2014   CLINICAL DATA:    NASO G TUBE PLACEMENT WITH FLUORO   Fluoroscopy was utilized by the requesting physician.  No radiographic  interpretation.      Medications:     Scheduled Medications: . acetaminophen  1,000 mg Oral 4 times per day   Or  . acetaminophen (TYLENOL) oral liquid 160 mg/5 mL  1,000 mg Per Tube 4 times per day  . acetaminophen (TYLENOL) oral liquid 160 mg/5 mL  650 mg Per Tube Once   Or  . acetaminophen  650 mg Rectal Once  . amiodarone  150 mg Intravenous Once  . antiseptic oral rinse  7 mL Mouth Rinse QID  . budesonide-formoterol  2 puff Inhalation BID  . chlorhexidine  15 mL Mouth Rinse BID  . insulin aspart  0-24 Units Subcutaneous 6 times per day  . insulin glargine  10 Units Subcutaneous BID  . metoCLOPramide (REGLAN) injection  10 mg Intravenous 4 times per day  . pantoprazole sodium  40 mg Per Tube QHS  . piperacillin-tazobactam (ZOSYN)  IV  3.375 g Intravenous 3 times per day  . potassium chloride  10 mEq Intravenous Q1 Hr x 5  . sodium chloride  10 mL Intravenous Q12H  . vancomycin  1,000 mg Intravenous Q12H  . warfarin  2.5 mg Oral q1800  . Warfarin - Physician Dosing Inpatient   Does not apply q1800    Infusions: . sodium  chloride 20 mL (10/09/14 0517)  . amiodarone 30 mg/hr (10/09/14 0646)  . dexmedetomidine 0.7 mcg/kg/hr (10/09/14 0600)  . epinephrine 5 mcg/min (10/09/14 0600)  . feeding supplement (VITAL 1.5 CAL) 1,000 mL (10/08/14 1830)  . fentaNYL infusion INTRAVENOUS 50 mcg/hr (10/09/14 0600)  . furosemide (LASIX) infusion 4 mg/hr (10/09/14 1128)  . lactated ringers 20 mL/hr at 10/06/14 0543  . lactated ringers 20 mL/hr at 10/09/14 0517  . milrinone 0.25 mcg/kg/min (10/09/14 0600)  . norepinephrine (LEVOPHED) Adult infusion 10 mcg/min (10/09/14 1130)    PRN Medications: sodium chloride, fentaNYL, levalbuterol, midazolam, ondansetron (ZOFRAN) IV, oxyCODONE, sodium chloride, traMADol   Assessment:    1.  A/C systolic HF class IV with cardiogenic shock- EF 15% with moderate RV dysfunction  3/16 echo.         -S/P HMII LVAD 09/21/14  2. CAD s/p Anterior STEMI on 06/12/14 with stenting of LAD- was on coumadin + plavix, statin.  Now holding coumadin, off Plavix.  3. Acute on chronic hypercarbic respiratory failure  4. 06/12/14 VT in setting of STEMI 5. Ankylosing spondylitis. 6. Bilateral PE with R DVT- S/P IVC filter 08/27/2014--->coumadin 7. Acute Blood Loss into pleural space:  Back to OR 4/5 and 4/6 for recurrent bleed. Received multiple blood products.  8. PAF: On amiodarone.  Plan/Discussion:   S/P 09/21/2014 HMII LVAD.   Back to OR 4/5 and and again 4/6 for recurrent bleed and evacuation clots. Received multiple blood products. Hemoglobin 8.8 today.   PI improved now that he has had re-evacuation from pleural space.  Multiple chest tubes, 2 more to come out today.    Will work on weaning epinephrine today.  Off NO.  Possible vent wean tomorrow.  Agree with turning down Lasix gtt, CVP now 6.   H/o VT and atrial fibrillation with RVR briefly last night.  On amiodarone gtt.   Restarted low dose coumadin with goal INR 1.5-2 today.   Will not restart Plavix as anterior wall completely necrotic.   LVAD parameters stable.    I reviewed the LVAD parameters from today, and compared the results to the patient's prior recorded data.  No programming changes were made.  The LVAD is functioning within specified parameters.  The patient performs LVAD self-test daily.  LVAD interrogation was negative for any significant power changes, alarms or PI events/speed drops.  LVAD equipment check completed and is in good working order.  Back-up equipment present.   LVAD education done on emergency procedures and precautions and reviewed exit site care.  Length of Stay: [place]   10/09/2014, 12:05 PM  VAD Team --- VAD ISSUES ONLY--- Pager [phone] (7am - 7am)  Advanced Heart Failure Team  Pager [phone] (M-F; 7a - 4p)  Please contact [facility] for night-coverage after  hours (4p -7a ) and weekends on [email or web address]

## 2014-10-09 NOTE — Progress Notes (Signed)
Orthopedic Tech Progress Note Patient Details:  Scott [name] 08/25/1945 [id number]  Ortho Devices Ortho Device/Splint Location: Bilateral recumbent boots Ortho Device/Splint Interventions: Application   Cammer, Theodoro Parma 10/09/2014, 7:47 PM

## 2014-10-10 ENCOUNTER — Inpatient Hospital Stay (HOSPITAL_COMMUNITY): Payer: Medicare Other

## 2014-10-10 LAB — POCT I-STAT, CHEM 8
BUN: 13 mg/dL (ref 6–23)
CREATININE: 0.6 mg/dL (ref 0.50–1.35)
Calcium, Ion: 1.1 mmol/L — ABNORMAL LOW (ref 1.13–1.30)
Chloride: 90 mmol/L — ABNORMAL LOW (ref 96–112)
Glucose, Bld: 100 mg/dL — ABNORMAL HIGH (ref 70–99)
HEMATOCRIT: 30 % — AB (ref 39.0–52.0)
Hemoglobin: 10.2 g/dL — ABNORMAL LOW (ref 13.0–17.0)
POTASSIUM: 4 mmol/L (ref 3.5–5.1)
Sodium: 135 mmol/L (ref 135–145)
TCO2: 36 mmol/L (ref 0–100)

## 2014-10-10 LAB — COMPREHENSIVE METABOLIC PANEL
ALT: 19 U/L (ref 0–53)
AST: 28 U/L (ref 0–37)
Albumin: 1.7 g/dL — ABNORMAL LOW (ref 3.5–5.2)
Alkaline Phosphatase: 207 U/L — ABNORMAL HIGH (ref 39–117)
Anion gap: 6 (ref 5–15)
BUN: 12 mg/dL (ref 6–23)
CO2: 36 mmol/L — ABNORMAL HIGH (ref 19–32)
Calcium: 7.3 mg/dL — ABNORMAL LOW (ref 8.4–10.5)
Chloride: 92 mmol/L — ABNORMAL LOW (ref 96–112)
Creatinine, Ser: 0.59 mg/dL (ref 0.50–1.35)
GFR calc Af Amer: >90 mL/min (ref >90)
GFR calc non Af Amer: >90 mL/min (ref >90)
Glucose, Bld: 191 mg/dL — ABNORMAL HIGH (ref 70–99)
Potassium: 3.3 mmol/L — ABNORMAL LOW (ref 3.5–5.1)
Sodium: 134 mmol/L — ABNORMAL LOW (ref 135–145)
Total Bilirubin: 1.2 mg/dL (ref 0.3–1.2)
Total Protein: 4.8 g/dL — ABNORMAL LOW (ref 6.0–8.3)

## 2014-10-10 LAB — POCT I-STAT 3, ART BLOOD GAS (G3+)
ACID-BASE EXCESS: 13 mmol/L — AB (ref 0.0–2.0)
ACID-BASE EXCESS: 12 mmol/L — AB (ref 0.0–2.0)
ACID-BASE EXCESS: 12 mmol/L — AB (ref 0.0–2.0)
ACID-BASE EXCESS: 13 mmol/L — AB (ref 0.0–2.0)
ACID-BASE EXCESS: 8 mmol/L — AB (ref 0.0–2.0)
Acid-Base Excess: 8 mmol/L — ABNORMAL HIGH (ref 0.0–2.0)
Acid-Base Excess: 14 mmol/L — ABNORMAL HIGH (ref 0.0–2.0)
Acid-Base Excess: 11 mmol/L — ABNORMAL HIGH (ref 0.0–2.0)
BICARBONATE: 39.5 meq/L — AB (ref 20.0–24.0)
BICARBONATE: 37.2 meq/L — AB (ref 20.0–24.0)
BICARBONATE: 34.9 meq/L — AB (ref 20.0–24.0)
Bicarbonate: 34.2 mEq/L — ABNORMAL HIGH (ref 20.0–24.0)
Bicarbonate: 39.7 mEq/L — ABNORMAL HIGH (ref 20.0–24.0)
Bicarbonate: 38.7 mEq/L — ABNORMAL HIGH (ref 20.0–24.0)
Bicarbonate: 39.7 mEq/L — ABNORMAL HIGH (ref 20.0–24.0)
Bicarbonate: 40.1 mEq/L — ABNORMAL HIGH (ref 20.0–24.0)
O2 SAT: 98 %
O2 SAT: 99 %
O2 SAT: 99 %
O2 SAT: 99 %
O2 Saturation: 99 %
O2 Saturation: 99 %
O2 Saturation: 99 %
O2 Saturation: 97 %
PCO2 ART: 55.8 mmHg — AB (ref 35.0–45.0)
PCO2 ART: 61 mmHg — AB (ref 35.0–45.0)
PCO2 ART: 62.8 mmHg — AB (ref 35.0–45.0)
PCO2 ART: 63.6 mmHg — AB (ref 35.0–45.0)
PH ART: 7.397 (ref 7.350–7.450)
PH ART: 7.43 (ref 7.350–7.450)
PH ART: 7.39 (ref 7.350–7.450)
PO2 ART: 147 mmHg — AB (ref 80.0–100.0)
PO2 ART: 137 mmHg — AB (ref 80.0–100.0)
Patient temperature: 37.3
Patient temperature: 37
Patient temperature: 37.1
Patient temperature: 37
Patient temperature: 36.9
Patient temperature: 37.4
TCO2: 36 mmol/L (ref 0–100)
TCO2: 41 mmol/L (ref 0–100)
TCO2: 42 mmol/L (ref 0–100)
TCO2: 39 mmol/L (ref 0–100)
TCO2: 41 mmol/L (ref 0–100)
TCO2: 42 mmol/L (ref 0–100)
TCO2: 42 mmol/L (ref 0–100)
TCO2: 37 mmol/L (ref 0–100)
pCO2 arterial: 54.9 mmHg — ABNORMAL HIGH (ref 35.0–45.0)
pCO2 arterial: 59.8 mmHg (ref 35.0–45.0)
pCO2 arterial: 61.6 mmHg (ref 35.0–45.0)
pCO2 arterial: 65.4 mmHg (ref 35.0–45.0)
pH, Arterial: 7.466 — ABNORMAL HIGH (ref 7.350–7.450)
pH, Arterial: 7.411 (ref 7.350–7.450)
pH, Arterial: 7.39 (ref 7.350–7.450)
pH, Arterial: 7.415 (ref 7.350–7.450)
pH, Arterial: 7.348 — ABNORMAL LOW (ref 7.350–7.450)
pO2, Arterial: 110 mmHg — ABNORMAL HIGH (ref 80.0–100.0)
pO2, Arterial: 120 mmHg — ABNORMAL HIGH (ref 80.0–100.0)
pO2, Arterial: 139 mmHg — ABNORMAL HIGH (ref 80.0–100.0)
pO2, Arterial: 151 mmHg — ABNORMAL HIGH (ref 80.0–100.0)
pO2, Arterial: 164 mmHg — ABNORMAL HIGH (ref 80.0–100.0)
pO2, Arterial: 105 mmHg — ABNORMAL HIGH (ref 80.0–100.0)

## 2014-10-10 LAB — GLUCOSE, CAPILLARY
GLUCOSE-CAPILLARY: 127 mg/dL — AB (ref 70–99)
GLUCOSE-CAPILLARY: 149 mg/dL — AB (ref 70–99)
GLUCOSE-CAPILLARY: 154 mg/dL — AB (ref 70–99)
Glucose-Capillary: 73 mg/dL (ref 70–99)
Glucose-Capillary: 93 mg/dL (ref 70–99)
Glucose-Capillary: 96 mg/dL (ref 70–99)

## 2014-10-10 LAB — CBC
HCT: 28.7 % — ABNORMAL LOW (ref 39.0–52.0)
HEMOGLOBIN: 9.4 g/dL — AB (ref 13.0–17.0)
MCH: 30.2 pg (ref 26.0–34.0)
MCHC: 32.8 g/dL (ref 30.0–36.0)
MCV: 92.3 fL (ref 78.0–100.0)
PLATELETS: 204 10*3/uL (ref 150–400)
RBC: 3.11 MIL/uL — ABNORMAL LOW (ref 4.22–5.81)
RDW: 17.4 % — AB (ref 11.5–15.5)
WBC: 20 10*3/uL — ABNORMAL HIGH (ref 4.0–10.5)

## 2014-10-10 LAB — PROTIME-INR
INR: 1.24 (ref 0.00–1.49)
PROTHROMBIN TIME: 15.8 s — AB (ref 11.6–15.2)

## 2014-10-10 LAB — CARBOXYHEMOGLOBIN
Carboxyhemoglobin: 1.2 % (ref 0.5–1.5)
METHEMOGLOBIN: 1 % (ref 0.0–1.5)
O2 Saturation: 76.4 %
TOTAL HEMOGLOBIN: 9.4 g/dL — AB (ref 13.5–18.0)

## 2014-10-10 LAB — LACTATE DEHYDROGENASE: LDH: 286 U/L — ABNORMAL HIGH (ref 94–250)

## 2014-10-10 LAB — MAGNESIUM: Magnesium: 1.6 mg/dL (ref 1.5–2.5)

## 2014-10-10 MED ORDER — VITAL 1.5 CAL PO LIQD
1000.0000 mL | ORAL | Status: DC
  Administered 2014-10-10: 1000 mL
  Filled 2014-10-10 (×2): qty 1000

## 2014-10-10 MED ORDER — POTASSIUM CHLORIDE 10 MEQ/50ML IV SOLN
10.0000 meq | INTRAVENOUS | Status: AC
  Administered 2014-10-10 (×3): 10 meq via INTRAVENOUS
  Filled 2014-10-10: qty 50

## 2014-10-10 MED ORDER — MAGNESIUM SULFATE 2 GM/50ML IV SOLN
2.0000 g | Freq: Once | INTRAVENOUS | Status: AC
  Administered 2014-10-10: 2 g via INTRAVENOUS
  Filled 2014-10-10: qty 50

## 2014-10-10 MED ORDER — POTASSIUM CHLORIDE 10 MEQ/50ML IV SOLN
10.0000 meq | INTRAVENOUS | Status: AC
  Administered 2014-10-10 (×3): 10 meq via INTRAVENOUS
  Filled 2014-10-10 (×2): qty 50

## 2014-10-10 MED ORDER — ALBUMIN HUMAN 5 % IV SOLN
12.5000 g | Freq: Once | INTRAVENOUS | Status: AC
  Administered 2014-10-10: 12.5 g via INTRAVENOUS

## 2014-10-10 MED ORDER — AMIODARONE IV BOLUS ONLY 150 MG/100ML
150.0000 mg | Freq: Once | INTRAVENOUS | Status: AC
  Administered 2014-10-10: 150 mg via INTRAVENOUS

## 2014-10-10 MED ORDER — WARFARIN SODIUM 3 MG PO TABS
3.0000 mg | ORAL_TABLET | Freq: Every day | ORAL | Status: DC
  Administered 2014-10-10: 3 mg via ORAL
  Filled 2014-10-10 (×2): qty 1

## 2014-10-10 MED ORDER — POTASSIUM CHLORIDE 10 MEQ/50ML IV SOLN
10.0000 meq | INTRAVENOUS | Status: AC
Start: 2014-10-10 — End: 2014-10-10
  Administered 2014-10-10 (×2): 10 meq via INTRAVENOUS
  Filled 2014-10-10: qty 50

## 2014-10-10 NOTE — Progress Notes (Signed)
Patient ID: Scott [name], male   DOB: 09/06/1945, 69 y.o.   MRN: [id number] HeartMate 2 Rounding Note  Subjective:    S/P HMII LVAD on 09/20/2013.   Moved to ICU  4/5 due to low MAP and low hemoglobin. Taken Back to the OR 4/5 and 4/6  for recurrent bleed and evacuation of clots from left chest.   Nitric oxide off.  Currently on milrinone at 0.25 mcg, epi off, levo at 7,  lasix drip at 4 mg per hour, and amio 30 mg per hour. No further atrial fibrillation.  Diuresed very well yesterday again, CVP 9. Creatinine stable.  Speed turned back up to 9400.   Remains intubated. Awake.   Coumadin restarted at low dose, aiming for INR 1.5-2.   PAP 24/13, CVP 9 CI 3  LVAD INTERROGATION:  HeartMate II LVAD:  Flow 5 liters/min, speed 9400, power 5.7 , PI 6.9, 2 PI events.   Objective:    Vital Signs:   Temp:  [98.4 F (36.9 C)-99.9 F (37.7 C)] 98.6 F (37 C) (04/10 1000) Pulse Rate:  [25-136] 84 (04/10 1000) Resp:  [12-28] 28 (04/10 1000) BP: (75-115)/(65-74) 115/74 mmHg (04/10 0937) SpO2:  [92 %-100 %] 100 % (04/10 1000) Arterial Line BP: (67-124)/(56-76) 124/76 mmHg (04/10 1000) FiO2 (%):  [40 %] 40 % (04/10 0937) Weight:  [192 lb 3.9 oz (87.2 kg)] 192 lb 3.9 oz (87.2 kg) (04/10 0410) Last BM Date: 10/10/14 Mean arterial Pressure  80s  Intake/Output:   Intake/Output Summary (Last 24 hours) at 10/10/14 1100 Last data filed at 10/10/14 1010  Gross per 24 hour  Intake 4716.86 ml  Output   5880 ml  Net -1163.14 ml     Physical Exam: CVP 9 General: Intubated.  HEENT: normal Neck: supple. Carotids 2+ bilat; no bruits. No lymphadenopathy or thryomegaly appreciated. R neck Swan  Cor: Mechanical heart sounds with LVAD hum present. Lungs: clear Abdomen: soft, nontender, nondistended. No hepatosplenomegaly. No bruits or masses. Good bowel sounds. Driveline: Dressing looks good.  Extremities: no cyanosis, clubbing, rash.  1+ edema 1/3 up lower legs bilaterally.  Neuro: Awake on  vent.  GU: foley   Telemetry: NSR 80s   Labs: Basic Metabolic Panel:  Recent Labs Lab 10/06/14 1500  10/07/14 0409  10/08/14 0400 10/08/14 0410 10/08/14 1512 10/09/14 0037 10/09/14 0625 10/09/14 1600 10/10/14 0400 10/10/14 0749  NA 139  < > 138  < > 136 137 136 132*  --  134*  --  134*  K 4.0  < > 4.1  < > 3.6 3.6 3.5 3.5  --  4.2  --  3.3*  CL 103  < > 104  < > 101 95* 95* 94*  --  95*  --  92*  CO2 29  --  23  --  31  --   --   --   --  32  --  36*  GLUCOSE 103*  < > 132*  < > 141* 144* 118* 183*  --  123*  --  191*  BUN 21  < > 21  < > 16 18 15 17   --  14  --  12  CREATININE 0.94  < > 0.89  < > 0.62 0.70 0.70 0.70  --  0.51  --  0.59  CALCIUM 8.2*  --  8.0*  --  7.7*  --   --   --   --  7.4*  --  7.3*  MG 1.2*  --  1.3*  --  1.8  --   --   --  1.6  --  1.6  --   < > = values in this interval not displayed.  Liver Function Tests:  Recent Labs Lab 10/06/14 0315 10/06/14 0926 10/07/14 0409 10/08/14 0400 10/10/14 0749  AST 26 31 33 26 28  ALT 17 19 17 18 19   ALKPHOS 71 62 64 92 207*  BILITOT 2.7* 3.9* 2.3* 1.4* 1.2  PROT 4.8* 4.5* 4.1* 4.3* 4.8*  ALBUMIN 3.1* 2.8* 2.2* 1.9* 1.7*   No results for input(s): LIPASE, AMYLASE in the last 168 hours. No results for input(s): AMMONIA in the last 168 hours.  CBC:  Recent Labs Lab 10/07/14 0409  10/07/14 1540 10/08/14 0400  10/08/14 1512 10/08/14 1605 10/09/14 0037 10/09/14 1200 10/10/14 0400  WBC 26.0*  --  23.0* 20.1*  --   --  21.9*  --  19.9* 20.0*  NEUTROABS 22.6*  --   --   --   --   --   --   --   --   --   HGB 8.5*  < > 8.9* 8.9*  < > 9.9* 9.0* 9.9* 9.2* 9.4*  HCT 24.0*  < > 25.2* 26.2*  < > 29.0* 26.4* 29.0* 27.8* 28.7*  MCV 85.1  --  85.1 87.3  --   --  89.5  --  90.3 92.3  PLT 198  --  193 182  --   --  170  --  186 204  < > = values in this interval not displayed.  INR:  Recent Labs Lab 10/06/14 0930 10/07/14 0409 10/08/14 0400 10/09/14 0625 10/10/14 0400  INR 1.26 1.47 1.33 1.24 1.24     Other results:    Imaging: Dg Chest Port 1 View  10/10/2014   CLINICAL DATA:  68 year old male status post VATS. Initial encounter.  EXAM: PORTABLE CHEST - 1 VIEW  COMPARISON:  10/09/2014 and earlier.  FINDINGS: Portable AP semi upright view at 0557 hours. Stable endotracheal tube. Stable visible NG type tube and enteric feeding tube. Stable right IJ approach Swan-Ganz catheter. Stable right subclavian approach central line. Stable left chest tube. Partially visible LVAD again noted.  Stable cardiac size and mediastinal contours. Mild increased veiling opacity at the right lung base. Slightly improved ventilation at the left lung base. No pneumothorax or overt edema.  IMPRESSION: 1.  Stable lines and tubes.  Partially visible LVAD again noted. 2. No pneumothorax. Mildly worsening ventilation at the right lung base compatible with effusion and atelectasis. Mildly improved ventilation at the left lung base.   Electronically Signed   By: Genevie Ann M.D.   On: 10/10/2014 09:27   Dg Chest Port 1 View  10/09/2014   CLINICAL DATA:  LVAD placement  EXAM: PORTABLE CHEST - 1 VIEW  COMPARISON:  10/08/2014  FINDINGS: Endotracheal to, NG tube, Swan-Ganz catheter, and right PICC line are unchanged. Right chest tube in place without pneumothorax. There is focus of right lower lobe infiltrate again noted. LVAD device in place noted. There is a chest tube at the left lung base. Interval removal of 2 left chest tubes and 1 left chest tube remaining.  IMPRESSION: 1. Interval removal of 2 left chest tubes without pneumothorax. 2. Right chest tube remains with small right lower lobe infiltrate. No pneumothorax. 3. Otherwise stable support apparatus and overall no significant change.   Electronically Signed   By: Suzy Bouchard M.D.   On: 10/09/2014 08:38  Medications:     Scheduled Medications: . acetaminophen  1,000 mg Oral 4 times per day   Or  . acetaminophen (TYLENOL) oral liquid 160 mg/5 mL  1,000 mg Per  Tube 4 times per day  . acetaminophen (TYLENOL) oral liquid 160 mg/5 mL  650 mg Per Tube Once   Or  . acetaminophen  650 mg Rectal Once  . amiodarone  150 mg Intravenous Once  . antiseptic oral rinse  7 mL Mouth Rinse QID  . budesonide-formoterol  2 puff Inhalation BID  . chlorhexidine  15 mL Mouth Rinse BID  . insulin aspart  0-24 Units Subcutaneous 6 times per day  . insulin glargine  10 Units Subcutaneous BID  . magnesium sulfate 1 - 4 g bolus IVPB  2 g Intravenous Once  . metoCLOPramide (REGLAN) injection  10 mg Intravenous 4 times per day  . pantoprazole sodium  40 mg Per Tube QHS  . piperacillin-tazobactam (ZOSYN)  IV  3.375 g Intravenous 3 times per day  . potassium chloride  10 mEq Intravenous Q1 Hr x 2  . potassium chloride  10 mEq Intravenous Q1 Hr x 3  . sodium chloride  10 mL Intravenous Q12H  . vancomycin  1,000 mg Intravenous Q12H  . warfarin  3 mg Oral q1800  . Warfarin - Physician Dosing Inpatient   Does not apply q1800    Infusions: . sodium chloride 20 mL/hr at 10/10/14 0500  . amiodarone 30 mg/hr (10/10/14 0800)  . dexmedetomidine 0.2 mcg/kg/hr (10/10/14 0800)  . epinephrine Stopped (10/10/14 1030)  . feeding supplement (VITAL 1.5 CAL) 1,000 mL (10/10/14 0801)  . fentaNYL infusion INTRAVENOUS 25 mcg/hr (10/10/14 0800)  . furosemide (LASIX) infusion 4 mg/hr (10/10/14 0800)  . lactated ringers 20 mL/hr at 10/06/14 0543  . lactated ringers 20 mL/hr at 10/10/14 0500  . milrinone 0.25 mcg/kg/min (10/10/14 0800)  . norepinephrine (LEVOPHED) Adult infusion 7 mcg/min (10/10/14 1010)    PRN Medications: sodium chloride, fentaNYL, levalbuterol, midazolam, ondansetron (ZOFRAN) IV, oxyCODONE, sodium chloride, traMADol   Assessment:    1.  A/C systolic HF class IV with cardiogenic shock- EF 15% with moderate RV dysfunction 3/16 echo.         -S/P HMII LVAD 09/21/14  2. CAD s/p Anterior STEMI on 06/12/14 with stenting of LAD- was on coumadin + plavix, statin.  Now  holding coumadin, off Plavix.  3. Acute on chronic hypercarbic respiratory failure  4. 06/12/14 VT in setting of STEMI 5. Ankylosing spondylitis. 6. Bilateral PE with R DVT- S/P IVC filter 08/27/2014--->coumadin 7. Acute Blood Loss into pleural space:  Back to OR 4/5 and 4/6 for recurrent bleed. Received multiple blood products.  8. PAF: On amiodarone.  Plan/Discussion:   S/P 09/21/2014 HMII LVAD.   Back to OR 4/5 and and again 4/6 for recurrent bleed and evacuation clots. Received multiple blood products. Hemoglobin 9.4 today.   PI improved now that he has had re-evacuation from pleural space.      Off epinephrine and NO, coming down on norepinephrine.  Good cardiac output.  Continue current Lasix gtt 4 mg/hr to keep around net 1000 cc daily.   Vent weaning today.  Baseline hypercarbia, needed Bipap at night prior.  H/o VT and atrial fibrillation, no further atrial fibrillation.  On amiodarone gtt.   Restarted low dose coumadin with goal INR 1.5-2 today.   Will not restart Plavix as anterior wall completely necrotic.   LVAD parameters stable.    I reviewed the  LVAD parameters from today, and compared the results to the patient's prior recorded data.  No programming changes were made.  The LVAD is functioning within specified parameters.  The patient performs LVAD self-test daily.  LVAD interrogation was negative for any significant power changes, alarms or PI events/speed drops.  LVAD equipment check completed and is in good working order.  Back-up equipment present.   LVAD education done on emergency procedures and precautions and reviewed exit site care.  Length of Stay: [place]   10/10/2014, 11:00 AM  VAD Team --- VAD ISSUES ONLY--- Pager [phone] (7am - 7am)  Advanced Heart Failure Team  Pager [phone] (M-F; 7a - 4p)  Please contact [facility] for night-coverage after hours (4p -7a ) and weekends on [email or web address]

## 2014-10-10 NOTE — Progress Notes (Signed)
Bathed pt and provided pt with back rub and foot rub with Lavender lotion.  Provided ice packs to pt underarms and neck.  Will continue to monitor.

## 2014-10-10 NOTE — Progress Notes (Signed)
Pt was noted to be diaphoretic and anxious.  Pt stated "I just can't handle all this".  Attempted to calm pt down by talking with him.  Checked ABG to ensure that CO2 not elevated and CBG to ensure not dropped.  CO2 noted to be 63.6 and CBG noted to be 96.  Paged Dr. Cyndia Bent and provided update.  No new orders received.

## 2014-10-10 NOTE — Progress Notes (Signed)
HeartMate 2 Rounding Note  Subjective:   POD #19  VAD implant  (DT)  For acute /chronic systolic HF from ischemic cardiomyopathy and cardiogenic shock on preop IABP  POD#5 re-exploration for bleeding - coagulopathy/ oozing in pump pocket- all pump anastomoses dry  Prior hx PE- with large R pulmonary infarct,necrotic    lung/pneumonia- poss empyema on IV Zosyn- brown sputum Restrictive lung disease- FEV1 1.3 from ankylosing spondylitis and CO2 retention DM  HX ventricular, atrial arrhythmia Postoperative CO2 retention requiring BiPAP  Postoperative coagulopathy associated with INR 2.7   350 cc chest tube output in last  24 hr - leave 2 chest tubes Stable hemodynamics on medium dose inotropes 2D echo  with good RV fx, LV decompressed by VAD ready for extubation-  inhaled NO weaned off,        edema better with diuresisost 5 lbs Diuresing today with lasix drip 4 mg Panda placed and TF nutrition started- up to 60/hr- will hold for pending extubation Low dose coumadin started 3 mg daily  HeartMate II LVAD:  Flow 4.5 liters/min, speed 9200 rpm power 5.1, PI  7.0  Controller intact- will increase speed to 9440m  Objective:    Vital Signs:   Temp:  [98.4 F (36.9 C)-99.9 F (37.7 C)] 98.8 F (37.1 C) (04/10 0700) Pulse Rate:  [25-136] 81 (04/10 0700) Resp:  [12-28] 17 (04/10 0700) BP: (75-94)/(65-69) 75/65 mmHg (04/09 1600) SpO2:  [92 %-100 %] 100 % (04/10 0700) Arterial Line BP: (67-121)/(56-76) 114/69 mmHg (04/10 0700) FiO2 (%):  [40 %] 40 % (04/10 0356) Weight:  [192 lb 3.9 oz (87.2 kg)] 192 lb 3.9 oz (87.2 kg) (04/10 0410) Last BM Date: 10/04/14 Mean arterial Pressure 78  Intake/Output:   Intake/Output Summary (Last 24 hours) at 10/10/14 0733 Last data filed at 10/10/14 0700  Gross per 24 hour  Intake 4779.13 ml  Output   5815 ml  Net -1035.87 ml     Physical Exam: General:sedated on vent HEENT: normal Neck: supple. JVP . Carotids 2+ bilat; no bruits. No  lymphadenopathy or thryomegaly appreciated. Cor: Mechanical heart sounds with LVAD hum present. Lungs: bibasilar rales Abdomen: soft, nontender, nondistended. No hepatosplenomegaly. No bruits or masses. Good bowel sounds. Driveline: C/D/I; securement device intact and driveline incorporated Extremities: warm well perfused,no cyanosis, clubbing, rash, edema- Neuro: responsive when sedation wears off Sternal incision dry   Telemetry:nsr  Labs: Basic Metabolic Panel:  Recent Labs Lab 10/06/14 0926  10/06/14 1500  10/07/14 0409  10/08/14 0400 10/08/14 0410 10/08/14 1512 10/09/14 0037 10/09/14 0625 10/09/14 1600 10/10/14 0400  NA 140  < > 139  < > 138  < > 136 137 136 132*  --  134*  --   K 4.0  < > 4.0  < > 4.1  < > 3.6 3.6 3.5 3.5  --  4.2  --   CL 104  < > 103  < > 104  < > 101 95* 95* 94*  --  95*  --   CO2 29  --  29  --  23  --  31  --   --   --   --  32  --   GLUCOSE 79  < > 103*  < > 132*  < > 141* 144* 118* 183*  --  123*  --   BUN 18  < > 21  < > 21  < > 16 18 15 17   --  14  --   CREATININE 1.03  < >  0.94  < > 0.89  < > 0.62 0.70 0.70 0.70  --  0.51  --   CALCIUM 8.7  --  8.2*  --  8.0*  --  7.7*  --   --   --   --  7.4*  --   MG  --   --  1.2*  --  1.3*  --  1.8  --   --   --  1.6  --  1.6  < > = values in this interval not displayed.  Liver Function Tests:  Recent Labs Lab 10/06/14 0315 10/06/14 0926 10/07/14 0409 10/08/14 0400  AST 26 31 33 26  ALT 17 19 17 18   ALKPHOS 71 62 64 92  BILITOT 2.7* 3.9* 2.3* 1.4*  PROT 4.8* 4.5* 4.1* 4.3*  ALBUMIN 3.1* 2.8* 2.2* 1.9*   No results for input(s): LIPASE, AMYLASE in the last 168 hours. No results for input(s): AMMONIA in the last 168 hours.  CBC:  Recent Labs Lab 10/07/14 0409  10/07/14 1540 10/08/14 0400  10/08/14 1512 10/08/14 1605 10/09/14 0037 10/09/14 1200 10/10/14 0400  WBC 26.0*  --  23.0* 20.1*  --   --  21.9*  --  19.9* 20.0*  NEUTROABS 22.6*  --   --   --   --   --   --   --   --   --     HGB 8.5*  < > 8.9* 8.9*  < > 9.9* 9.0* 9.9* 9.2* 9.4*  HCT 24.0*  < > 25.2* 26.2*  < > 29.0* 26.4* 29.0* 27.8* 28.7*  MCV 85.1  --  85.1 87.3  --   --  89.5  --  90.3 92.3  PLT 198  --  193 182  --   --  170  --  186 204  < > = values in this interval not displayed.  INR:  Recent Labs Lab 10/06/14 0930 10/07/14 0409 10/08/14 0400 10/09/14 0625 10/10/14 0400  INR 1.26 1.47 1.33 1.24 1.24    Other results:  EKG:   Imaging: Dg Abd 1 View  10/08/2014   CLINICAL DATA:  69 year old male for feeding tube advancement.  EXAM: ABDOMEN - 1 VIEW  COMPARISON:  Prior studies  FINDINGS: A small bore feeding tube was advanced, with tip at the duodenal/jejunal junction, confirmed by contrast administration.  IMPRESSION: Advancement of small bore feeding tube with tip at the duodenal/jejunal junction.   Electronically Signed   By: Margarette Canada M.D.   On: 10/08/2014 13:37   Dg Chest Port 1 View  10/09/2014   CLINICAL DATA:  LVAD placement  EXAM: PORTABLE CHEST - 1 VIEW  COMPARISON:  10/08/2014  FINDINGS: Endotracheal to, NG tube, Swan-Ganz catheter, and right PICC line are unchanged. Right chest tube in place without pneumothorax. There is focus of right lower lobe infiltrate again noted. LVAD device in place noted. There is a chest tube at the left lung base. Interval removal of 2 left chest tubes and 1 left chest tube remaining.  IMPRESSION: 1. Interval removal of 2 left chest tubes without pneumothorax. 2. Right chest tube remains with small right lower lobe infiltrate. No pneumothorax. 3. Otherwise stable support apparatus and overall no significant change.   Electronically Signed   By: Suzy Bouchard M.D.   On: 10/09/2014 08:38   Dg Addison Bailey G Tube Plc W/fl-no Rad  10/08/2014   CLINICAL DATA:    NASO G TUBE PLACEMENT WITH FLUORO  Fluoroscopy was utilized by  the requesting physician.  No radiographic  interpretation.      Medications:     Scheduled Medications: . acetaminophen  1,000 mg Oral 4  times per day   Or  . acetaminophen (TYLENOL) oral liquid 160 mg/5 mL  1,000 mg Per Tube 4 times per day  . acetaminophen (TYLENOL) oral liquid 160 mg/5 mL  650 mg Per Tube Once   Or  . acetaminophen  650 mg Rectal Once  . amiodarone  150 mg Intravenous Once  . antiseptic oral rinse  7 mL Mouth Rinse QID  . budesonide-formoterol  2 puff Inhalation BID  . chlorhexidine  15 mL Mouth Rinse BID  . insulin aspart  0-24 Units Subcutaneous 6 times per day  . insulin glargine  10 Units Subcutaneous BID  . metoCLOPramide (REGLAN) injection  10 mg Intravenous 4 times per day  . pantoprazole sodium  40 mg Per Tube QHS  . piperacillin-tazobactam (ZOSYN)  IV  3.375 g Intravenous 3 times per day  . potassium chloride  10 mEq Intravenous Q1 Hr x 2  . sodium chloride  10 mL Intravenous Q12H  . vancomycin  1,000 mg Intravenous Q12H  . warfarin  3 mg Oral q1800  . Warfarin - Physician Dosing Inpatient   Does not apply q1800    Infusions: . sodium chloride 20 mL/hr at 10/10/14 0500  . amiodarone 30 mg/hr (10/10/14 0700)  . dexmedetomidine 0.2 mcg/kg/hr (10/10/14 0700)  . epinephrine 4 mcg/min (10/10/14 0700)  . feeding supplement (VITAL 1.5 CAL)    . fentaNYL infusion INTRAVENOUS 25 mcg/hr (10/10/14 0700)  . furosemide (LASIX) infusion 4 mg/hr (10/10/14 0700)  . lactated ringers 20 mL/hr at 10/06/14 0543  . lactated ringers 20 mL/hr at 10/10/14 0500  . milrinone 0.25 mcg/kg/min (10/10/14 0700)  . norepinephrine (LEVOPHED) Adult infusion 10 mcg/min (10/10/14 0700)    PRN Medications: sodium chloride, fentaNYL, levalbuterol, midazolam, ondansetron (ZOFRAN) IV, oxyCODONE, sodium chloride, traMADol   Assessment:  Stable hemodynamics- , VAD speed  n0w 9400 due to large pulse pressure on a-line Resting patient  after re-exploration for bleeding- removing fluid before vent wean- hopefully will be ready later today  Cont IV Zosyn for possible pneumonia, empyema from past pulmonary infarct Cont  vancomycin postop 48 hrs Cont low dose coumadin Plan/Discussion:    Remove drains as indicated- keep pocket and L pleural today Vent wean -  hope to extubate if PCO2 ok TF nutrition Resume anticoagulation slowly- inr target 1.5-2.0, hold ASA Patients condition updated and plan of care discussed daily with wife    I reviewed the LVAD parameters from today, and compared the results to the patient's prior recorded data.  No programming changes were made.  The LVAD is functioning within specified parameters.  The patient performs LVAD self-test daily.  LVAD interrogation was negative for any significant power changes, alarms or PI events/speed drops.  LVAD equipment check completed and is in good working order.  Back-up equipment present.   LVAD education done on emergency procedures and precautions and reviewed exit site care.  Length of Stay: [place] [facility] 10/10/2014, 7:33 AM

## 2014-10-10 NOTE — Progress Notes (Signed)
Wasted 30cc Fentanyl down the sink with Neysa Hotter, RN as witness.  Full bag of Fentanyl hand delivered to Pharmacy.

## 2014-10-10 NOTE — Progress Notes (Signed)
Patient ID: Scott [name], male   DOB: 28-Dec-1945, 69 y.o.   MRN: [id number]  SICU Evening Rounds:  Hemodynamically stable on low dose levophed and milrinone 0.25.  CI 3.8. Pump parameters ok  Extubated this evening and looks good so far. Alert with a strong voice. Will plan to use bipap this evening. Follow up ABG pending.  Excellent urine output on lasix 4. -1291 cc today.   Feeding tube has been pulled back some. Will keep tube feeds off and check CXR for placement.

## 2014-10-10 NOTE — Procedures (Signed)
Extubation Procedure Note  Patient Details:   Name: MERICK KELLEHER DOB: [birth year] MRN: [id number]   Airway Documentation:     Evaluation  O2 sats: stable throughout Complications: No apparent complications Patient did tolerate procedure well. Bilateral Breath Sounds: Clear, Diminished, Rhonchi Suctioning: Oral, Airway Yes   Pt tolerated wean, positive for cuff leak, extubated to 4L [place]. No stridor or dyspnea noted after extubation. RT will continue to monitor.   Mariam Dollar 10/10/2014, 6:52 PM

## 2014-10-11 ENCOUNTER — Inpatient Hospital Stay (HOSPITAL_COMMUNITY): Payer: Medicare Other

## 2014-10-11 DIAGNOSIS — F411 Generalized anxiety disorder: Secondary | ICD-10-CM

## 2014-10-11 DIAGNOSIS — G479 Sleep disorder, unspecified: Secondary | ICD-10-CM

## 2014-10-11 LAB — GLUCOSE, CAPILLARY
GLUCOSE-CAPILLARY: 92 mg/dL (ref 70–99)
GLUCOSE-CAPILLARY: 68 mg/dL — AB (ref 70–99)
Glucose-Capillary: 82 mg/dL (ref 70–99)
Glucose-Capillary: 103 mg/dL — ABNORMAL HIGH (ref 70–99)
Glucose-Capillary: 105 mg/dL — ABNORMAL HIGH (ref 70–99)
Glucose-Capillary: 114 mg/dL — ABNORMAL HIGH (ref 70–99)

## 2014-10-11 LAB — POCT I-STAT 3, ART BLOOD GAS (G3+)
ACID-BASE EXCESS: 12 mmol/L — AB (ref 0.0–2.0)
Acid-Base Excess: 12 mmol/L — ABNORMAL HIGH (ref 0.0–2.0)
BICARBONATE: 39.1 meq/L — AB (ref 20.0–24.0)
Bicarbonate: 39.6 mEq/L — ABNORMAL HIGH (ref 20.0–24.0)
O2 SAT: 99 %
O2 Saturation: 63 %
PO2 ART: 163 mmHg — AB (ref 80.0–100.0)
Patient temperature: 36.4
TCO2: 41 mmol/L (ref 0–100)
TCO2: 42 mmol/L (ref 0–100)
pCO2 arterial: 62.4 mmHg (ref 35.0–45.0)
pCO2 arterial: 72.4 mmHg (ref 35.0–45.0)
pH, Arterial: 7.402 (ref 7.350–7.450)
pH, Arterial: 7.343 — ABNORMAL LOW (ref 7.350–7.450)
pO2, Arterial: 35 mmHg — CL (ref 80.0–100.0)

## 2014-10-11 LAB — COMPREHENSIVE METABOLIC PANEL
ALT: 21 U/L (ref 0–53)
AST: 29 U/L (ref 0–37)
Albumin: 1.9 g/dL — ABNORMAL LOW (ref 3.5–5.2)
Alkaline Phosphatase: 196 U/L — ABNORMAL HIGH (ref 39–117)
Anion gap: 0 — ABNORMAL LOW (ref 5–15)
BUN: 8 mg/dL (ref 6–23)
CO2: 43 mmol/L (ref 19–32)
Calcium: 7.6 mg/dL — ABNORMAL LOW (ref 8.4–10.5)
Chloride: 94 mmol/L — ABNORMAL LOW (ref 96–112)
Creatinine, Ser: 0.58 mg/dL (ref 0.50–1.35)
GFR calc Af Amer: >90 mL/min (ref >90)
GFR calc non Af Amer: >90 mL/min (ref >90)
Glucose, Bld: 90 mg/dL (ref 70–99)
Potassium: 3.4 mmol/L — ABNORMAL LOW (ref 3.5–5.1)
Sodium: 136 mmol/L (ref 135–145)
Total Bilirubin: 1.5 mg/dL — ABNORMAL HIGH (ref 0.3–1.2)
Total Protein: 5 g/dL — ABNORMAL LOW (ref 6.0–8.3)

## 2014-10-11 LAB — CBC
HCT: 28.5 % — ABNORMAL LOW (ref 39.0–52.0)
Hemoglobin: 9.1 g/dL — ABNORMAL LOW (ref 13.0–17.0)
MCH: 30.1 pg (ref 26.0–34.0)
MCHC: 31.9 g/dL (ref 30.0–36.0)
MCV: 94.4 fL (ref 78.0–100.0)
PLATELETS: 252 10*3/uL (ref 150–400)
RBC: 3.02 MIL/uL — ABNORMAL LOW (ref 4.22–5.81)
RDW: 17.3 % — ABNORMAL HIGH (ref 11.5–15.5)
WBC: 18.3 10*3/uL — ABNORMAL HIGH (ref 4.0–10.5)

## 2014-10-11 LAB — CARBOXYHEMOGLOBIN
CARBOXYHEMOGLOBIN: 1.3 % (ref 0.5–1.5)
METHEMOGLOBIN: 1 % (ref 0.0–1.5)
O2 SAT: 84.7 %
Total hemoglobin: 9.3 g/dL — ABNORMAL LOW (ref 13.5–18.0)

## 2014-10-11 LAB — POCT I-STAT, CHEM 8
BUN: 13 mg/dL (ref 6–23)
CALCIUM ION: 1.17 mmol/L (ref 1.13–1.30)
CHLORIDE: 91 mmol/L — AB (ref 96–112)
Creatinine, Ser: 0.8 mg/dL (ref 0.50–1.35)
GLUCOSE: 120 mg/dL — AB (ref 70–99)
HCT: 30 % — ABNORMAL LOW (ref 39.0–52.0)
Hemoglobin: 10.2 g/dL — ABNORMAL LOW (ref 13.0–17.0)
Potassium: 3.6 mmol/L (ref 3.5–5.1)
Sodium: 136 mmol/L (ref 135–145)
TCO2: 31 mmol/L (ref 0–100)

## 2014-10-11 LAB — PROTIME-INR
INR: 1.21 (ref 0.00–1.49)
Prothrombin Time: 15.5 seconds — ABNORMAL HIGH (ref 11.6–15.2)

## 2014-10-11 LAB — LACTATE DEHYDROGENASE: LDH: 312 U/L — ABNORMAL HIGH (ref 94–250)

## 2014-10-11 LAB — MAGNESIUM: Magnesium: 1.8 mg/dL (ref 1.5–2.5)

## 2014-10-11 LAB — CLOSTRIDIUM DIFFICILE BY PCR: Toxigenic C. Difficile by PCR: NEGATIVE

## 2014-10-11 LAB — MRSA PCR SCREENING: MRSA by PCR: NEGATIVE

## 2014-10-11 MED ORDER — CETYLPYRIDINIUM CHLORIDE 0.05 % MT LIQD
7.0000 mL | Freq: Two times a day (BID) | OROMUCOSAL | Status: DC
  Administered 2014-10-11 – 2014-10-13 (×5): 7 mL via OROMUCOSAL

## 2014-10-11 MED ORDER — ACETAZOLAMIDE ER 500 MG PO CP12
500.0000 mg | ORAL_CAPSULE | Freq: Two times a day (BID) | ORAL | Status: DC
  Filled 2014-10-11 (×2): qty 1

## 2014-10-11 MED ORDER — FUROSEMIDE 10 MG/ML IJ SOLN
40.0000 mg | Freq: Every day | INTRAMUSCULAR | Status: DC
  Administered 2014-10-11 – 2014-10-13 (×3): 40 mg via INTRAVENOUS
  Filled 2014-10-11 (×3): qty 4

## 2014-10-11 MED ORDER — POTASSIUM CHLORIDE 10 MEQ/50ML IV SOLN: 10.0000 meq | INTRAVENOUS | Status: DC

## 2014-10-11 MED ORDER — VANCOMYCIN HCL IN DEXTROSE 1-5 GM/200ML-% IV SOLN
1000.0000 mg | Freq: Two times a day (BID) | INTRAVENOUS | Status: AC
  Administered 2014-10-11 – 2014-10-14 (×8): 1000 mg via INTRAVENOUS
  Filled 2014-10-11 (×8): qty 200

## 2014-10-11 MED ORDER — FENTANYL CITRATE 0.05 MG/ML IJ SOLN: 50.0000 ug | Freq: Four times a day (QID) | INTRAMUSCULAR | Status: DC | PRN

## 2014-10-11 MED ORDER — MAGNESIUM SULFATE 2 GM/50ML IV SOLN
2.0000 g | Freq: Once | INTRAVENOUS | Status: AC
  Administered 2014-10-11: 2 g via INTRAVENOUS
  Filled 2014-10-11: qty 50

## 2014-10-11 MED ORDER — POTASSIUM CHLORIDE 10 MEQ/50ML IV SOLN
10.0000 meq | Freq: Once | INTRAVENOUS | Status: AC
  Administered 2014-10-11: 10 meq via INTRAVENOUS
  Filled 2014-10-11: qty 50

## 2014-10-11 MED ORDER — ASPIRIN 81 MG PO CHEW
81.0000 mg | CHEWABLE_TABLET | Freq: Every day | ORAL | Status: DC
  Administered 2014-10-11: 81 mg via ORAL
  Filled 2014-10-11: qty 1

## 2014-10-11 MED ORDER — MAGNESIUM SULFATE IN D5W 10-5 MG/ML-% IV SOLN
1.0000 g | Freq: Once | INTRAVENOUS | Status: DC
  Filled 2014-10-11: qty 100

## 2014-10-11 MED ORDER — POTASSIUM CHLORIDE 10 MEQ/50ML IV SOLN
10.0000 meq | INTRAVENOUS | Status: AC
  Administered 2014-10-11 (×3): 10 meq via INTRAVENOUS
  Filled 2014-10-11: qty 50

## 2014-10-11 MED ORDER — INSULIN GLARGINE 100 UNIT/ML ~~LOC~~ SOLN
10.0000 [IU] | Freq: Every day | SUBCUTANEOUS | Status: DC
  Filled 2014-10-11 (×2): qty 0.1

## 2014-10-11 MED ORDER — POTASSIUM CHLORIDE 10 MEQ/50ML IV SOLN
10.0000 meq | INTRAVENOUS | Status: AC
  Administered 2014-10-11 (×4): 10 meq via INTRAVENOUS
  Filled 2014-10-11 (×4): qty 50

## 2014-10-11 MED ORDER — ACETAZOLAMIDE ER 500 MG PO CP12
500.0000 mg | ORAL_CAPSULE | Freq: Two times a day (BID) | ORAL | Status: AC
Start: 2014-10-11 — End: 2014-10-11
  Administered 2014-10-11: 500 mg via ORAL
  Filled 2014-10-11: qty 1

## 2014-10-11 MED ORDER — DM-GUAIFENESIN ER 30-600 MG PO TB12
1.0000 | ORAL_TABLET | Freq: Two times a day (BID) | ORAL | Status: DC
  Administered 2014-10-11 – 2014-10-13 (×5): 1 via ORAL
  Filled 2014-10-11 (×8): qty 1

## 2014-10-11 MED ORDER — VITAL 1.5 CAL PO LIQD
1000.0000 mL | ORAL | Status: DC
  Administered 2014-10-11: 1000 mL
  Filled 2014-10-11 (×2): qty 1000

## 2014-10-11 MED ORDER — CITALOPRAM HYDROBROMIDE 20 MG PO TABS
20.0000 mg | ORAL_TABLET | Freq: Every day | ORAL | Status: DC
Start: 2014-10-11 — End: 2014-11-02
  Administered 2014-10-11 – 2014-11-02 (×23): 20 mg via ORAL
  Filled 2014-10-11 (×23): qty 1

## 2014-10-11 MED ORDER — WARFARIN SODIUM 4 MG PO TABS
4.0000 mg | ORAL_TABLET | Freq: Every day | ORAL | Status: DC
  Administered 2014-10-11: 4 mg via ORAL
  Filled 2014-10-11 (×2): qty 1

## 2014-10-11 NOTE — Progress Notes (Signed)
Pt aline unable to draw back.  RT called to bedside to assist.  RT unable to draw back from aline hub.  RT able to stick pt for ABG.  RT d/c aline d/t not drawing back.

## 2014-10-11 NOTE — Progress Notes (Signed)
UR Completed.  [phone]  

## 2014-10-11 NOTE — Progress Notes (Addendum)
Patient ID: Scott [name], male   DOB: 1946-03-05, 69 y.o.   MRN: [id number] HeartMate 2 Rounding Note  Subjective:    S/P HMII LVAD on 09/20/2013.   Moved to ICU  4/5 due to low MAP and low hemoglobin. Taken Back to the OR 4/5 and 4/6  for recurrent bleed and evacuation of clots from left chest.   Extubated last night. Now on Bipap. PCO2 stable in 60s. NO, epi and levophed off. Remains on milrinone at 0.25. Lasix 4/hr. Diuresing well.   Feels anxious.    PAP 20/8, CVP 8 Outputs good.   LVAD INTERROGATION:  HeartMate II LVAD:  Flow 5 liters/min, speed 9400, power 5.7 , PI 5.4, 2 PI events.   Objective:    Vital Signs:   Temp:  [97.5 F (36.4 C)-99.1 F (37.3 C)] 97.5 F (36.4 C) (04/11 0500) Pulse Rate:  [45-129] 79 (04/11 0500) Resp:  [17-28] 21 (04/11 0500) BP: (91-122)/(66-75) 91/66 mmHg (04/10 1500) SpO2:  [87 %-100 %] 100 % (04/11 0500) Arterial Line BP: (69-133)/(60-102) 102/71 mmHg (04/11 0206) FiO2 (%):  [40 %-50 %] 50 % (04/11 0253) Weight:  [85.7 kg (188 lb 15 oz)] 85.7 kg (188 lb 15 oz) (04/11 0500) Last BM Date: 10/10/14 Mean arterial Pressure  80s  Intake/Output:   Intake/Output Summary (Last 24 hours) at 10/11/14 0602 Last data filed at 10/11/14 0300  Gross per 24 hour  Intake 2837.04 ml  Output   4585 ml  Net -1747.96 ml     Physical Exam: CVP 8 General: on bipap.  HEENT: normal Neck: supple. Carotids 2+ bilat; no bruits. No lymphadenopathy or thryomegaly appreciated. R neck Swan  Cor: Mechanical heart sounds with LVAD hum present. Lungs: clear Abdomen: soft, nontender, nondistended. No hepatosplenomegaly. No bruits or masses. Good bowel sounds. Driveline: Dressing looks good.  Extremities: no cyanosis, clubbing, rash.  Trace edema Neuro: Awake alert GU: foley   Telemetry: NSR 70-80s   Labs: Basic Metabolic Panel:  Recent Labs Lab 10/07/14 0409  10/08/14 0400  10/09/14 0037 10/09/14 0625 10/09/14 1600 10/10/14 0400 10/10/14 0749  10/10/14 1504 10/11/14 0503  NA 138  < > 136  < > 132*  --  134*  --  134* 135 PENDING  K 4.1  < > 3.6  < > 3.5  --  4.2  --  3.3* 4.0 PENDING  CL 104  < > 101  < > 94*  --  95*  --  92* 90* PENDING  CO2 23  --  31  --   --   --  32  --  36*  --  PENDING  GLUCOSE 132*  < > 141*  < > 183*  --  123*  --  191* 100* 90  BUN 21  < > 16  < > 17  --  14  --  12 13 8   CREATININE 0.89  < > 0.62  < > 0.70  --  0.51  --  0.59 0.60 0.58  CALCIUM 8.0*  --  7.7*  --   --   --  7.4*  --  7.3*  --  7.6*  MG 1.3*  --  1.8  --   --  1.6  --  1.6  --   --  1.8  < > = values in this interval not displayed.  Liver Function Tests:  Recent Labs Lab 10/06/14 0926 10/07/14 0409 10/08/14 0400 10/10/14 0749 10/11/14 0503  AST 31 33 26 28 29  ALT 19 17 18 19 21   ALKPHOS 62 64 92 207* 196*  BILITOT 3.9* 2.3* 1.4* 1.2 1.5*  PROT 4.5* 4.1* 4.3* 4.8* 5.0*  ALBUMIN 2.8* 2.2* 1.9* 1.7* 1.9*   No results for input(s): LIPASE, AMYLASE in the last 168 hours. No results for input(s): AMMONIA in the last 168 hours.  CBC:  Recent Labs Lab 10/07/14 0409  10/08/14 0400  10/08/14 1605 10/09/14 0037 10/09/14 1200 10/10/14 0400 10/10/14 1504 10/11/14 0503  WBC 26.0*  < > 20.1*  --  21.9*  --  19.9* 20.0*  --  18.3*  NEUTROABS 22.6*  --   --   --   --   --   --   --   --   --   HGB 8.5*  < > 8.9*  < > 9.0* 9.9* 9.2* 9.4* 10.2* 9.1*  HCT 24.0*  < > 26.2*  < > 26.4* 29.0* 27.8* 28.7* 30.0* 28.5*  MCV 85.1  < > 87.3  --  89.5  --  90.3 92.3  --  94.4  PLT 198  < > 182  --  170  --  186 204  --  252  < > = values in this interval not displayed.  INR:  Recent Labs Lab 10/07/14 0409 10/08/14 0400 10/09/14 0625 10/10/14 0400 10/11/14 0503  INR 1.47 1.33 1.24 1.24 1.21    Other results:    Imaging: Dg Chest Port 1 View  10/10/2014   CLINICAL DATA:  69 year old male status post VATS. Initial encounter.  EXAM: PORTABLE CHEST - 1 VIEW  COMPARISON:  10/09/2014 and earlier.  FINDINGS: Portable AP semi  upright view at 0557 hours. Stable endotracheal tube. Stable visible NG type tube and enteric feeding tube. Stable right IJ approach Swan-Ganz catheter. Stable right subclavian approach central line. Stable left chest tube. Partially visible LVAD again noted.  Stable cardiac size and mediastinal contours. Mild increased veiling opacity at the right lung base. Slightly improved ventilation at the left lung base. No pneumothorax or overt edema.  IMPRESSION: 1.  Stable lines and tubes.  Partially visible LVAD again noted. 2. No pneumothorax. Mildly worsening ventilation at the right lung base compatible with effusion and atelectasis. Mildly improved ventilation at the left lung base.   Electronically Signed   By: Genevie Ann M.D.   On: 10/10/2014 09:27   Dg Chest Port 1 View  10/09/2014   CLINICAL DATA:  LVAD placement  EXAM: PORTABLE CHEST - 1 VIEW  COMPARISON:  10/08/2014  FINDINGS: Endotracheal to, NG tube, Swan-Ganz catheter, and right PICC line are unchanged. Right chest tube in place without pneumothorax. There is focus of right lower lobe infiltrate again noted. LVAD device in place noted. There is a chest tube at the left lung base. Interval removal of 2 left chest tubes and 1 left chest tube remaining.  IMPRESSION: 1. Interval removal of 2 left chest tubes without pneumothorax. 2. Right chest tube remains with small right lower lobe infiltrate. No pneumothorax. 3. Otherwise stable support apparatus and overall no significant change.   Electronically Signed   By: Suzy Bouchard M.D.   On: 10/09/2014 08:38   Dg Abd Portable 1v  10/10/2014   CLINICAL DATA:  Nasogastric tube placement.  EXAM: PORTABLE ABDOMEN - 1 VIEW  COMPARISON:  October 08, 2014.  FINDINGS: No evidence of bowel obstruction or ileus is noted. Distal tip of feeding tube appears to be in expected position of second portion of duodenum. Left ventricular assistance  device is again noted. Surgical drain is noted. Left-sided chest tube is noted.   IMPRESSION: Distal tip of feeding tube appears to be in expected position of second portion of the duodenum.   Electronically Signed   By: Marijo Conception, M.D.   On: 10/10/2014 21:23     Medications:     Scheduled Medications: . acetaminophen (TYLENOL) oral liquid 160 mg/5 mL  650 mg Per Tube Once   Or  . acetaminophen  650 mg Rectal Once  . amiodarone  150 mg Intravenous Once  . antiseptic oral rinse  7 mL Mouth Rinse QID  . budesonide-formoterol  2 puff Inhalation BID  . chlorhexidine  15 mL Mouth Rinse BID  . insulin aspart  0-24 Units Subcutaneous 6 times per day  . insulin glargine  10 Units Subcutaneous BID  . metoCLOPramide (REGLAN) injection  10 mg Intravenous 4 times per day  . pantoprazole sodium  40 mg Per Tube QHS  . piperacillin-tazobactam (ZOSYN)  IV  3.375 g Intravenous 3 times per day  . sodium chloride  10 mL Intravenous Q12H  . vancomycin  1,000 mg Intravenous Q12H  . warfarin  3 mg Oral q1800  . Warfarin - Physician Dosing Inpatient   Does not apply q1800    Infusions: . sodium chloride 20 mL/hr at 10/10/14 0500  . amiodarone 30 mg/hr (10/11/14 0300)  . feeding supplement (VITAL 1.5 CAL) Stopped (10/10/14 1730)  . furosemide (LASIX) infusion 4 mg/hr (10/11/14 0300)  . lactated ringers 20 mL/hr at 10/06/14 0543  . lactated ringers 20 mL/hr at 10/10/14 0500  . milrinone 0.25 mcg/kg/min (10/11/14 0300)  . norepinephrine (LEVOPHED) Adult infusion Stopped (10/11/14 0100)    PRN Medications: sodium chloride, fentaNYL, levalbuterol, ondansetron (ZOFRAN) IV, oxyCODONE, sodium chloride, traMADol   Assessment:    1.  A/C systolic HF class IV with cardiogenic shock- EF 15% with moderate RV dysfunction 3/16 echo.         -S/P HMII LVAD 09/21/14  2. CAD s/p Anterior STEMI on 06/12/14 with stenting of LAD- was on coumadin + plavix, statin.  Now holding coumadin, off Plavix.  3. Acute on chronic hypercarbic respiratory failure  4. 06/12/14 VT in setting of  STEMI 5. Ankylosing spondylitis. 6. Bilateral PE with R DVT- S/P IVC filter 08/27/2014--->coumadin 7. Acute Blood Loss into pleural space:  Back to OR 4/5 and 4/6 for recurrent bleed. Received multiple blood products.  8. PAF: On amiodarone.  Plan/Discussion:   S/P 09/21/2014 HMII LVAD.   Back to OR 4/5 and and again 4/6 for recurrent bleed and evacuation clots. Received multiple blood products. Hemoglobin 9stable.   PI improved now that he has had re-evacuation from pleural space.      Off vent. On Bipap. PCO2 in upper normal range for him. Continue nightly bipap, Try to take off during the day. Will start acetazolamide to see if it will help waste Co2.   Will cut milrinone to 0.125 and follow co-ox. Can stop lasix gtt. Switch to lasix 40 mg IV daily. Can supplement as need.  H/o VT and atrial fibrillation, no further atrial fibrillation.  On amiodarone gtt.  Supp K and magnesium.   Restarted low dose coumadin with goal INR 1.5-2.   Will not restart Plavix as anterior wall completely necrotic.   LVAD parameters stable.    I reviewed the LVAD parameters from today, and compared the results to the patient's prior recorded data.  No programming changes were made.  The  LVAD is functioning within specified parameters.  The patient performs LVAD self-test daily.  LVAD interrogation was negative for any significant power changes, alarms or PI events/speed drops.  LVAD equipment check completed and is in good working order.  Back-up equipment present.   LVAD education done on emergency procedures and precautions and reviewed exit site care.  The patient is critically ill with multiple organ systems failure and requires high complexity decision making for assessment and support, frequent evaluation and titration of therapies, application of advanced monitoring technologies and extensive interpretation of multiple databases.   Critical Care Time devoted to patient care services described in this note  is 35 Minutes.   Length of Stay: 34  Scott Coline Calkin  MD 10/11/2014, 6:02 AM  VAD Team --- VAD ISSUES ONLY--- Pager [phone] (7am - 7am)  Advanced Heart Failure Team  Pager [phone] (M-F; 7a - 4p)  Please contact [facility] for night-coverage after hours (4p -7a ) and weekends on [email or web address]

## 2014-10-11 NOTE — Evaluation (Signed)
Physical Therapy Evaluation Patient Details Name: Scott [name] MRN: [id number] DOB: 23-Jun-1946 Today's Date: 10/11/2014   History of Present Illness  69 y.o. M with extensive cardiac hx as well as recent PE / DVT, admitted for SOB due to heart failure, cardiogenic, shock, PE. Evaluated by VAD team. Found to have mod - large R pleural effusion. LVAD implantation 09/22/14. Progressed well until developed hemothorax and required return to OR 4/5 and 4/6 for Redo sternotomy, reexploration of chest and removal of hematoma.    Clinical Impression  Patient is s/p above surgeries/complications resulting in functional limitations due to the deficits listed below (see PT Problem List). Pt previously highly motivated for d/c home from hospital. Hopeful he will progress quickly, however if not may need to consider CIR prior to home. Patient will benefit from skilled PT to increase their independence and safety with mobility to allow discharge to the venue listed below.       Follow Up Recommendations Home health PT;Supervision for mobility/OOB    Equipment Recommendations   (? 2 wheeled RW with fold down seat)    Recommendations for Other Services OT consult     Precautions / Restrictions Precautions Precautions: Sternal;Fall Precaution Comments: LVAD      Mobility  Bed Mobility Overal bed mobility: Needs Assistance Bed Mobility: Supine to Sit     Supine to sit: HOB elevated;Mod assist;+2 for physical assistance;+2 for safety/equipment     General bed mobility comments: cues for sequence and safety to pivot to EOB  Transfers Overall transfer level: Needs assistance Equipment used: 2 person hand held assist Transfers: Sit to/from Stand Sit to Stand: Mod assist;+2 physical assistance;+2 safety/equipment Stand pivot transfers: Min assist;+2 physical assistance;+2 safety/equipment       General transfer comment: close guarding without use of RW due to pt's anxiety re: being too  weak to stand and get to chair  Ambulation/Gait             General Gait Details: deferred due to pt reports of fatigue and inability  Stairs            Wheelchair Mobility    Modified Rankin (Stroke Patients Only)       Balance     Sitting balance-Leahy Scale: Fair       Standing balance-Leahy Scale: Poor                               Pertinent Vitals/Pain Pain Assessment: Faces Faces Pain Scale: Hurts even more Pain Location: chest (sternum and chest tube sites) Pain Intervention(s): Limited activity within patient's tolerance;Monitored during session;Premedicated before session;Repositioned    Home Living Family/patient expects to be discharged to:: Private residence Living Arrangements: Spouse/significant other Available Help at Discharge: Family;Available 24 hours/day Type of Home: House Home Access: Stairs to enter Entrance Stairs-Rails: None Entrance Stairs-Number of Steps: 2 Home Layout: Two level;Able to live on main level with bedroom/bathroom Home Equipment: None      Prior Function Level of Independence: Independent               Hand Dominance   Dominant Hand: Right    Extremity/Trunk Assessment   Upper Extremity Assessment: Generalized weakness (limited assessment due to sternal precautions)           Lower Extremity Assessment: Generalized weakness      Cervical / Trunk Assessment: Normal  Communication   Communication: No difficulties  Cognition Arousal/Alertness: Awake/alert Behavior  During Therapy: Flat affect Overall Cognitive Status: Within Functional Limits for tasks assessed                      General Comments      Exercises General Exercises - Lower Extremity Ankle Circles/Pumps: AROM;Both;10 reps Long Arc Quad: AROM;Both;5 reps      Assessment/Plan    PT Assessment Patient needs continued PT services  PT Diagnosis Generalized weakness;Difficulty walking;Acute pain   PT  Problem List Decreased strength;Decreased activity tolerance;Decreased balance;Decreased mobility;Decreased knowledge of use of DME;Decreased knowledge of precautions;Pain  PT Treatment Interventions Gait training;DME instruction;Functional mobility training;Patient/family education;Stair training;Therapeutic activities;Therapeutic exercise   PT Goals (Current goals can be found in the Care Plan section) Acute Rehab PT Goals Patient Stated Goal: to go home  PT Goal Formulation: With patient Time For Goal Achievement: 10/25/14 Potential to Achieve Goals: Good    Frequency Min 3X/week   Barriers to discharge        Co-evaluation               End of Session Equipment Utilized During Treatment:  (gait belt not used due to bil chest tubes and sternal prec) Activity Tolerance: Patient limited by fatigue Patient left: in chair;with call bell/phone within reach;with nursing/sitter in room Nurse Communication: Mobility status         Time: 4599-7741 PT Time Calculation (min) (ACUTE ONLY): 24 min   Charges:   PT Evaluation $Initial PT Evaluation Tier I: 1 Procedure PT Treatments $Therapeutic Activity: 8-22 mins   PT G Codes:        Kriss Ishler 11-08-14, 4:33 PM Pager [phone]

## 2014-10-11 NOTE — Progress Notes (Signed)
Progress Note from the Palliative Medicine Team at [facility]: [name] is lying in bed and has no complaints today. He is still not sleeping well. He says pain is minimal and tolerable at this point. He tells me that he had a bad episode of anxiety but describes this as an isolated incident - he does not appear anxious now. [name] tells me very clearly that he would not want re-intubation or more surgery but [name] hears this but goes on to say that this depends on what the cause and indication for intubation/surgery. There is quite a disconnect between them on this subject. I hope this will not be needed but I will continue to follow and discuss and will also share this information with the rest of the care team.     Objective: No Known Allergies Scheduled Meds: . acetaminophen (TYLENOL) oral liquid 160 mg/5 mL  650 mg Per Tube Once   Or  . acetaminophen  650 mg Rectal Once  . antiseptic oral rinse  7 mL Mouth Rinse QID  . aspirin  81 mg Oral Daily  . budesonide-formoterol  2 puff Inhalation BID  . chlorhexidine  15 mL Mouth Rinse BID  . citalopram  20 mg Oral Daily  . dextromethorphan-guaiFENesin  1 tablet Oral BID  . furosemide  40 mg Intravenous Daily  . insulin aspart  0-24 Units Subcutaneous 6 times per day  . insulin glargine  10 Units Subcutaneous QHS  . metoCLOPramide (REGLAN) injection  10 mg Intravenous 4 times per day  . pantoprazole sodium  40 mg Per Tube QHS  . piperacillin-tazobactam (ZOSYN)  IV  3.375 g Intravenous 3 times per day  . potassium chloride  10 mEq Intravenous Once  . sodium chloride  10 mL Intravenous Q12H  . vancomycin  1,000 mg Intravenous Q12H  . warfarin  4 mg Oral q1800  . Warfarin - Physician Dosing Inpatient   Does not apply q1800   Continuous Infusions: . sodium chloride 20 mL/hr at 10/11/14 1100  . amiodarone 30 mg/hr (10/11/14 1100)  . feeding supplement (VITAL 1.5 CAL) 1,000 mL (10/11/14 1100)  . lactated  ringers 20 mL/hr at 10/11/14 1100  . milrinone 0.125 mcg/kg/min (10/11/14 1100)  . norepinephrine (LEVOPHED) Adult infusion Stopped (10/11/14 0100)   PRN Meds:.sodium chloride, fentaNYL, levalbuterol, ondansetron (ZOFRAN) IV, oxyCODONE, sodium chloride, traMADol  BP 91/66 mmHg  Pulse 79  Temp(Src) 98.8 F (37.1 C) (Core (Comment))  Resp 22  Ht 5\' 8"  (1.727 m)  Wt 85.7 kg (188 lb 15 oz)  BMI 28.73 kg/m2  SpO2 100%   PPS: 30%   Intake/Output Summary (Last 24 hours) at 10/11/14 1145 Last data filed at 10/11/14 1100  Gross per 24 hour  Intake 2497.78 ml  Output   4898 ml  Net -2400.22 ml      LBM: 4/11  Physical Exam:  General: NAD, lying in bed HEENT: [place]/AT, NGT in place, Rt IJ swan Chest: No labored breathing, symmetric CVS: RRR Abdomen: Soft, NT, ND Ext: MAE, trace BLE edema Neuro: Awake, alert, oriented x 3  Labs: CBC    Component Value Date/Time   WBC 18.3* 10/11/2014 0503   RBC 3.02* 10/11/2014 0503   HGB 9.1* 10/11/2014 0503   HCT 28.5* 10/11/2014 0503   PLT 252 10/11/2014 0503   MCV 94.4 10/11/2014 0503   MCH 30.1 10/11/2014 0503   MCHC 31.9 10/11/2014 0503   RDW 17.3* 10/11/2014 0503   LYMPHSABS 0.8 10/07/2014  0409   MONOABS 2.6* 10/07/2014 0409   EOSABS 0.0 10/07/2014 0409   BASOSABS 0.0 10/07/2014 0409    BMET    Component Value Date/Time   NA 136 10/11/2014 0503   K 3.4* 10/11/2014 0503   CL 94* 10/11/2014 0503   CO2 43* 10/11/2014 0503   GLUCOSE 90 10/11/2014 0503   BUN 8 10/11/2014 0503   CREATININE 0.58 10/11/2014 0503   CALCIUM 7.6* 10/11/2014 0503   GFRNONAA >90 10/11/2014 0503   GFRAA >90 10/11/2014 0503    CMP     Component Value Date/Time   NA 136 10/11/2014 0503   K 3.4* 10/11/2014 0503   CL 94* 10/11/2014 0503   CO2 43* 10/11/2014 0503   GLUCOSE 90 10/11/2014 0503   BUN 8 10/11/2014 0503   CREATININE 0.58 10/11/2014 0503   CALCIUM 7.6* 10/11/2014 0503   PROT 5.0* 10/11/2014 0503   ALBUMIN 1.9* 10/11/2014 0503   AST  29 10/11/2014 0503   ALT 21 10/11/2014 0503   ALKPHOS 196* 10/11/2014 0503   BILITOT 1.5* 10/11/2014 0503   GFRNONAA >90 10/11/2014 0503   GFRAA >90 10/11/2014 0503    Assessment and Plan: 1. Code Status: No CPR - he expresses that he would not want intubation or further surgery. I shared this with Dr. Nils Pyle.  2. Symptom Control:  1. Sleep disturbance: Recommend low dose trazodone 25 mg qhs.  2. Anxiety: Agree with celexa initiation.  3. Psycho/Social: Emotional support provided to patient and family.  4. Spiritual: He is supported by his own pastor.  5. Disposition: To be determined. Hopeful for home when stable.    Time In Time Out Total Time Spent with Patient Total Overall Time  1110 1130 21min 2min    Greater than 50%  of this time was spent counseling and coordinating care related to the above assessment and plan.  Vinie Sill, NP Palliative Medicine Team Pager # [phone] (M-F 8a-5p) Team Phone # [phone] (Nights/Weekends)

## 2014-10-11 NOTE — Progress Notes (Signed)
HeartMate 2 Rounding Note  Subjective:   POD #20  VAD implant  (DT)  For acute /chronic systolic HF from ischemic cardiomyopathy and cardiogenic shock on preop IABP  POD#6 re-exploration for bleeding - coagulopathy/ oozing in pump pocket- all pump anastomoses dry  Prior hx PE- with large R pulmonary infarct,necrotic    lung/pneumonia- poss empyema on IV Zosyn- brown sputum Restrictive lung disease- FEV1 1.3 from ankylosing spondylitis and CO2 retention DM  HX ventricular, atrial arrhythmia Postoperative CO2 retention requiring BiPAP  Postoperative coagulopathy associated with INR 2.7   240 cc chest tube output in last  24 hr - leave 2 chest tubes Stable hemodynamics - low dose milrinone only 2D echo  with good RV fx, LV decompressed by VAD  Patient extubated yesterday- PCO2 up due to restrictive thoracic deformity- reduced compliance Tolerated BIPAP over night- wet cough today Diuresing well - lasix drip changed to daily iv dose Panda placed and TF nutrition started- up to 30/hr- post extubation swallow eval pending Low dose coumadin started - now up to 4 mg daily, ECASA at 81 mg   INR goal 1.5 - 2.0  Elevated WBC  cont IV Fortaz and vancomycin- cultures all neg- suspect pulmonary source- will mobilize OOB today  HeartMate II LVAD:  Flow 4.5 liters/min, speed 9400 rpm power 5.1, PI  5.0 , no PI events Controller intact- will increase speed to 9400rpm  Objective:    Vital Signs:   Temp:  [97.5 F (36.4 C)-99.1 F (37.3 C)] 97.7 F (36.5 C) (04/11 0800) Pulse Rate:  [45-129] 77 (04/11 0800) Resp:  [17-28] 21 (04/11 0800) BP: (91-122)/(66-75) 91/66 mmHg (04/10 1500) SpO2:  [87 %-100 %] 99 % (04/11 0800) Arterial Line BP: (69-133)/(60-102) 93/89 mmHg (04/11 0400) FiO2 (%):  [40 %-50 %] 50 % (04/11 0253) Weight:  [188 lb 15 oz (85.7 kg)] 188 lb 15 oz (85.7 kg) (04/11 0500) Last BM Date: 10/10/14 Mean arterial Pressure 78  Intake/Output:   Intake/Output Summary (Last 24  hours) at 10/11/14 0844 Last data filed at 10/11/14 0800  Gross per 24 hour  Intake 2601.99 ml  Output   4875 ml  Net -2273.01 ml     Physical Exam: General:extubated , alert HEENT: normal Neck: supple. JVP . Carotids 2+ bilat; no bruits. No lymphadenopathy or thryomegaly appreciated. Cor: Mechanical heart sounds with LVAD hum present. Lungs: bibasilar scattered rhonchi Abdomen: soft, nontender, nondistended. No hepatosplenomegaly. No bruits or masses. Good bowel sounds. Driveline: C/D/I; securement device intact and driveline incorporated Extremities: warm well perfused,no cyanosis, clubbing, rash, edema- Neuro: responsive when sedation wears off Sternal incision dry   Telemetry:nsr  Labs: Basic Metabolic Panel:  Recent Labs Lab 10/07/14 0409  10/08/14 0400  10/09/14 0037 10/09/14 0625 10/09/14 1600 10/10/14 0400 10/10/14 0749 10/10/14 1504 10/11/14 0503  NA 138  < > 136  < > 132*  --  134*  --  134* 135 136  K 4.1  < > 3.6  < > 3.5  --  4.2  --  3.3* 4.0 3.4*  CL 104  < > 101  < > 94*  --  95*  --  92* 90* 94*  CO2 23  --  31  --   --   --  32  --  36*  --  43*  GLUCOSE 132*  < > 141*  < > 183*  --  123*  --  191* 100* 90  BUN 21  < > 16  < > 17  --  14  --  12 13 8   CREATININE 0.89  < > 0.62  < > 0.70  --  0.51  --  0.59 0.60 0.58  CALCIUM 8.0*  --  7.7*  --   --   --  7.4*  --  7.3*  --  7.6*  MG 1.3*  --  1.8  --   --  1.6  --  1.6  --   --  1.8  < > = values in this interval not displayed.  Liver Function Tests:  Recent Labs Lab 10/06/14 0926 10/07/14 0409 10/08/14 0400 10/10/14 0749 10/11/14 0503  AST 31 33 26 28 29   ALT 19 17 18 19 21   ALKPHOS 62 64 92 207* 196*  BILITOT 3.9* 2.3* 1.4* 1.2 1.5*  PROT 4.5* 4.1* 4.3* 4.8* 5.0*  ALBUMIN 2.8* 2.2* 1.9* 1.7* 1.9*   No results for input(s): LIPASE, AMYLASE in the last 168 hours. No results for input(s): AMMONIA in the last 168 hours.  CBC:  Recent Labs Lab 10/07/14 0409  10/08/14 0400   10/08/14 1605 10/09/14 0037 10/09/14 1200 10/10/14 0400 10/10/14 1504 10/11/14 0503  WBC 26.0*  < > 20.1*  --  21.9*  --  19.9* 20.0*  --  18.3*  NEUTROABS 22.6*  --   --   --   --   --   --   --   --   --   HGB 8.5*  < > 8.9*  < > 9.0* 9.9* 9.2* 9.4* 10.2* 9.1*  HCT 24.0*  < > 26.2*  < > 26.4* 29.0* 27.8* 28.7* 30.0* 28.5*  MCV 85.1  < > 87.3  --  89.5  --  90.3 92.3  --  94.4  PLT 198  < > 182  --  170  --  186 204  --  252  < > = values in this interval not displayed.  INR:  Recent Labs Lab 10/07/14 0409 10/08/14 0400 10/09/14 0625 10/10/14 0400 10/11/14 0503  INR 1.47 1.33 1.24 1.24 1.21    Other results:  EKG:   Imaging: Dg Chest Port 1 View  10/11/2014   CLINICAL DATA:  Followup left ventricular assist device.  EXAM: PORTABLE CHEST - 1 VIEW  COMPARISON:  10/10/2014 and multiple previous  FINDINGS: Soft pre knee tube enters the abdomen. Swan-Ganz catheter placed from a right internal jugular approach has its tip at the pulmonary outflow tract. Right arm PICC has its tip in the right atrium. Left thoracostomy tube is in place. Left ventricular assist device partially visible. Nasogastric tube and endotracheal tube have been removed. There slightly improved aeration in the right lower lobe and slightly worsened aeration in the left lower lobe. Small amount of pleural fluid on the left.  IMPRESSION: Endotracheal tube and nasogastric tube removed.  Left ventricular assist device partially imaged. Left chest tube in place. No pneumothorax. Small amount of pleural fluid on the left.  Right arm PICC tip in the right atrium.  Improved aeration in the right lower lobe. Worsened aeration in the left lower lobe.   Electronically Signed   By: Nelson Chimes M.D.   On: 10/11/2014 08:15   Dg Chest Port 1 View  10/10/2014   CLINICAL DATA:  69 year old male status post VATS. Initial encounter.  EXAM: PORTABLE CHEST - 1 VIEW  COMPARISON:  10/09/2014 and earlier.  FINDINGS: Portable AP semi  upright view at 0557 hours. Stable endotracheal tube. Stable visible NG type tube and enteric feeding  tube. Stable right IJ approach Swan-Ganz catheter. Stable right subclavian approach central line. Stable left chest tube. Partially visible LVAD again noted.  Stable cardiac size and mediastinal contours. Mild increased veiling opacity at the right lung base. Slightly improved ventilation at the left lung base. No pneumothorax or overt edema.  IMPRESSION: 1.  Stable lines and tubes.  Partially visible LVAD again noted. 2. No pneumothorax. Mildly worsening ventilation at the right lung base compatible with effusion and atelectasis. Mildly improved ventilation at the left lung base.   Electronically Signed   By: Genevie Ann M.D.   On: 10/10/2014 09:27   Dg Abd Portable 1v  10/10/2014   CLINICAL DATA:  Nasogastric tube placement.  EXAM: PORTABLE ABDOMEN - 1 VIEW  COMPARISON:  October 08, 2014.  FINDINGS: No evidence of bowel obstruction or ileus is noted. Distal tip of feeding tube appears to be in expected position of second portion of duodenum. Left ventricular assistance device is again noted. Surgical drain is noted. Left-sided chest tube is noted.  IMPRESSION: Distal tip of feeding tube appears to be in expected position of second portion of the duodenum.   Electronically Signed   By: Marijo Conception, M.D.   On: 10/10/2014 21:23     Medications:     Scheduled Medications: . acetaminophen (TYLENOL) oral liquid 160 mg/5 mL  650 mg Per Tube Once   Or  . acetaminophen  650 mg Rectal Once  . acetaZOLAMIDE  500 mg Oral BID  . antiseptic oral rinse  7 mL Mouth Rinse QID  . aspirin  81 mg Oral Daily  . budesonide-formoterol  2 puff Inhalation BID  . chlorhexidine  15 mL Mouth Rinse BID  . dextromethorphan-guaiFENesin  1 tablet Oral BID  . furosemide  40 mg Intravenous Daily  . insulin aspart  0-24 Units Subcutaneous 6 times per day  . insulin glargine  10 Units Subcutaneous QHS  . magnesium sulfate 1 - 4 g  bolus IVPB  1 g Intravenous Once  . metoCLOPramide (REGLAN) injection  10 mg Intravenous 4 times per day  . pantoprazole sodium  40 mg Per Tube QHS  . piperacillin-tazobactam (ZOSYN)  IV  3.375 g Intravenous 3 times per day  . potassium chloride  10 mEq Intravenous Q1 Hr x 4  . potassium chloride  10 mEq Intravenous Q1 Hr x 2  . sodium chloride  10 mL Intravenous Q12H  . warfarin  4 mg Oral q1800  . Warfarin - Physician Dosing Inpatient   Does not apply q1800    Infusions: . sodium chloride 20 mL/hr at 10/10/14 0500  . amiodarone 30 mg/hr (10/11/14 0300)  . feeding supplement (VITAL 1.5 CAL) Stopped (10/10/14 1730)  . lactated ringers 20 mL/hr at 10/10/14 0500  . milrinone 0.125 mcg/kg/min (10/11/14 0800)  . norepinephrine (LEVOPHED) Adult infusion Stopped (10/11/14 0100)    PRN Medications: sodium chloride, fentaNYL, levalbuterol, ondansetron (ZOFRAN) IV, oxyCODONE, sodium chloride, traMADol   Assessment:  Stable hemodynamics- , VAD speed  now 9400  CO-OX >70%  Successfully extubated but needs HS BIPAP Cont IV Zosyn, vanco  for possible pneumonia, empyema from past pulmonary infarct Coumadin dose gradually increased for INR 1.5-2.0 Plan/Discussion:   Mobile OOB with PT Remove drains as indicated- keep pocket and L pleural today Swallow study, cont TF nutrition Resume anticoagulation slowly- inr target 1.5-2.0,add asa 81 mg Patients condition updated and plan of care discussed daily with wife    I reviewed the LVAD parameters from  today, and compared the results to the patient's prior recorded data.  No programming changes were made.  The LVAD is functioning within specified parameters.  The patient performs LVAD self-test daily.  LVAD interrogation was negative for any significant power changes, alarms or PI events/speed drops.  LVAD equipment check completed and is in good working order.  Back-up equipment present.   LVAD education done on emergency procedures and precautions  and reviewed exit site care.  Length of Stay: [place] III 10/11/2014, 8:44 AM

## 2014-10-11 NOTE — Progress Notes (Signed)
RT attempted ABG unsuccessfully. MD aware.

## 2014-10-11 NOTE — Progress Notes (Signed)
CSW met with patient and wife at bedside. Patient extubated, sitting up in hospital bed and talking with staff and wife. Wife reports patient does not remember any of the events from the end of last week and she was relieved. She reports continued improvement and denied any concerns at this time. Patient was tired and was attempting to get some rest. CSW provided support and will continue to be available throughout recovery. Raquel Sarna, [facility].

## 2014-10-11 NOTE — Anesthesia Postprocedure Evaluation (Signed)
  Anesthesia Post-op Note  Patient: Scott [name]  Procedure(s) Performed: Procedure(s): VIDEO ASSISTED THORACOSCOPY (VATS)/THOROCOTOMY (Left)  Patient Location: SICU  Anesthesia Type:General  Level of Consciousness: sedated and Patient remains intubated per anesthesia plan  Airway and Oxygen Therapy: Patient remains intubated per anesthesia plan and Patient placed on Ventilator (see vital sign flow sheet for setting)  Post-op Pain: none  Post-op Assessment: Post-op Vital signs reviewed, Patient's Cardiovascular Status Stable, Respiratory Function Stable, Patent Airway and Pain level controlled  Post-op Vital Signs: stable  Last Vitals:  Filed Vitals:   10/11/14 0500  BP:   Pulse: 79  Temp: 36.4 C  Resp: 21    Complications: No apparent anesthesia complications

## 2014-10-11 NOTE — Progress Notes (Signed)
Speech Language Pathology Treatment:    Patient Details Name: Scott [name] MRN: [id number] DOB: 17-Dec-1945 Today's Date: 10/11/2014    Orders received for FEES per Dr. [name].  Chart reviewed; discussed procedure with Mr. Speiser, who consents to proceed with FEES next date.   [name] 10/11/2014, 2:41 PM

## 2014-10-11 NOTE — Progress Notes (Signed)
Admitted 09/10/14 with worsening heart failure and cardiogenic shock.  HeartMate II LVAD implated on 09/21/14 by Dr. Darcey Nora as DT VAD.   Vital signs: Temp: 97.5 - 99.1 deg F HR: 77 - 100 SR (had episodes over weekend with AF RVR) Doppler MAP:  78 - 86 O2 Sat:  96 - 100% (4L [place], BiPAP QHS)  Wt in lbs:  173 > ... 204 > 192 > 188  LVAD interrogation reveals:  Speed:  9400 Flow: 5.8 Power: 6 PI: 4.1 - 5.2 Alarms: none Events: Few PI events daily  Fixed speed:  9400 Low speed limit: 8800  Blood products: OR:   09/21/14:  1 unit PC, 2 plts, 3 FFP 10/05/14:  3 PC's, 500 cell saver 10/06/14:  2 PCs, 2 cryo, 300 cell saver     ICU:  09/22/14:   1 unit PC 10/05/14:  2 PCs, 2 FFP 10/06/14:  5 PCs, 2 FFP, 1 Plt, 1 cryo, 3 Factro VII, DDAVP  Nitric Oxide: off since Friday 10/11/14  Extubated: 10/10/14 and tolerated BiPAP overnight   Current gtts: Amiodarone 30 mg/h Milinone:  0.125 mcg/kg/min Lasix: 4 mg/h  ASA 81 mg:  Resumed 10/11/14  Warfarin: increasing doses slowly. INR goal 1.5 - 2.0  Labs:  LDH trend: 364 > 496 (peak) >... > 280 > 286 > 312  INR trend: 1.42 > 1.44 > 1.6 > 1.84 > 2.02 > 1.81 > ... > 1.78 >2.0>1.0>1.2 > 1.47 > 1.33  Co-Ox:  62.3% >64>65 > 70.3 > 84.7%  WBC:  11.4 > 15.4 > 17.2 > ...  >27.5 > 23.0 >  20.1 > 18.3  Hgb: ... > 9.9> 8.4 > 8.5 > 8.9  Drive Line:  Dressing clean, dry and intact. His wife has resumed changing dressings over the weekend. Being changed daily   Plan/Recommendations and Discharge Needs as discussed with VAD team:  1.  2 CT's remaining for now until output decreases.   2. Extubated on 4 LPM  maintaining sats. Tolerated BiPAP last night. Trend ABGs as determined by medical team.  4. WBC trending down slowly --> urine culture negative (final), C.Diff PCR negative, BC drawn 10/05/14 NGTD x2 & sputum culture 10/05/14 Rare Gram+ Cocci in pairs (prelim).  Pt on Vanc and Zosyn.   5.  Lasix gtt today at 4 mg/h now. Maintaining > 2L UOP per  shift. Swelling much improved.   6.  TF at goal with additional protein added per Dietary team. Swallow Study planned today. He is doing well with ice chips currently without coughing or choking. Voice is strong.   7. No power elevations observed or sustained. Will continue to monitor with lower INR goal d/t bleeding.   8. Resume PT/OT/Cardiac Rehab to help with strength and recovery. Follow for home health needs (re: CIR, Home PT, equipment, etc.)  9. Pain is controlled on current regimen. Expressed some concerns for long term analgesia needs.

## 2014-10-12 ENCOUNTER — Inpatient Hospital Stay (HOSPITAL_COMMUNITY): Payer: Medicare Other

## 2014-10-12 LAB — POCT I-STAT 3, ART BLOOD GAS (G3+)
ACID-BASE EXCESS: 9 mmol/L — AB (ref 0.0–2.0)
BICARBONATE: 36.6 meq/L — AB (ref 20.0–24.0)
O2 Saturation: 99 %
PH ART: 7.335 — AB (ref 7.350–7.450)
PO2 ART: 129 mmHg — AB (ref 80.0–100.0)
TCO2: 39 mmol/L (ref 0–100)
pCO2 arterial: 68.6 mmHg (ref 35.0–45.0)

## 2014-10-12 LAB — COMPREHENSIVE METABOLIC PANEL
ALT: 23 U/L (ref 0–53)
AST: 37 U/L (ref 0–37)
Albumin: 1.8 g/dL — ABNORMAL LOW (ref 3.5–5.2)
Alkaline Phosphatase: 244 U/L — ABNORMAL HIGH (ref 39–117)
Anion gap: 6 (ref 5–15)
BUN: 14 mg/dL (ref 6–23)
CO2: 38 mmol/L — ABNORMAL HIGH (ref 19–32)
Calcium: 8 mg/dL — ABNORMAL LOW (ref 8.4–10.5)
Chloride: 94 mmol/L — ABNORMAL LOW (ref 96–112)
Creatinine, Ser: 0.81 mg/dL (ref 0.50–1.35)
GFR calc Af Amer: >90 mL/min (ref >90)
GFR calc non Af Amer: >90 mL/min (ref >90)
Glucose, Bld: 127 mg/dL — ABNORMAL HIGH (ref 70–99)
Potassium: 3.3 mmol/L — ABNORMAL LOW (ref 3.5–5.1)
Sodium: 138 mmol/L (ref 135–145)
Total Bilirubin: 1.3 mg/dL — ABNORMAL HIGH (ref 0.3–1.2)
Total Protein: 4.9 g/dL — ABNORMAL LOW (ref 6.0–8.3)

## 2014-10-12 LAB — GLUCOSE, CAPILLARY
GLUCOSE-CAPILLARY: 105 mg/dL — AB (ref 70–99)
GLUCOSE-CAPILLARY: 130 mg/dL — AB (ref 70–99)
GLUCOSE-CAPILLARY: 110 mg/dL — AB (ref 70–99)
Glucose-Capillary: 116 mg/dL — ABNORMAL HIGH (ref 70–99)
Glucose-Capillary: 121 mg/dL — ABNORMAL HIGH (ref 70–99)
Glucose-Capillary: 122 mg/dL — ABNORMAL HIGH (ref 70–99)

## 2014-10-12 LAB — POCT I-STAT, CHEM 8
BUN: 14 mg/dL (ref 6–23)
CHLORIDE: 91 mmol/L — AB (ref 96–112)
Calcium, Ion: 1.1 mmol/L — ABNORMAL LOW (ref 1.13–1.30)
Creatinine, Ser: 0.9 mg/dL (ref 0.50–1.35)
GLUCOSE: 556 mg/dL — AB (ref 70–99)
HCT: 25 % — ABNORMAL LOW (ref 39.0–52.0)
Hemoglobin: 8.5 g/dL — ABNORMAL LOW (ref 13.0–17.0)
POTASSIUM: 3 mmol/L — AB (ref 3.5–5.1)
Sodium: 122 mmol/L — ABNORMAL LOW (ref 135–145)
TCO2: 31 mmol/L (ref 0–100)

## 2014-10-12 LAB — CULTURE, BLOOD (ROUTINE X 2)
CULTURE: NO GROWTH
CULTURE: NO GROWTH

## 2014-10-12 LAB — CARBOXYHEMOGLOBIN
Carboxyhemoglobin: 1.4 % (ref 0.5–1.5)
Methemoglobin: 1 % (ref 0.0–1.5)
O2 SAT: 76.1 %
TOTAL HEMOGLOBIN: 8.1 g/dL — AB (ref 13.5–18.0)

## 2014-10-12 LAB — CBC
HCT: 27.5 % — ABNORMAL LOW (ref 39.0–52.0)
HCT: 29.9 % — ABNORMAL LOW (ref 39.0–52.0)
Hemoglobin: 8.4 g/dL — ABNORMAL LOW (ref 13.0–17.0)
Hemoglobin: 9.1 g/dL — ABNORMAL LOW (ref 13.0–17.0)
MCH: 29.8 pg (ref 26.0–34.0)
MCH: 29.6 pg (ref 26.0–34.0)
MCHC: 30.5 g/dL (ref 30.0–36.0)
MCHC: 30.4 g/dL (ref 30.0–36.0)
MCV: 97.5 fL (ref 78.0–100.0)
MCV: 97.4 fL (ref 78.0–100.0)
PLATELETS: 397 10*3/uL (ref 150–400)
Platelets: 446 10*3/uL — ABNORMAL HIGH (ref 150–400)
RBC: 2.82 MIL/uL — ABNORMAL LOW (ref 4.22–5.81)
RBC: 3.07 MIL/uL — ABNORMAL LOW (ref 4.22–5.81)
RDW: 17.3 % — AB (ref 11.5–15.5)
RDW: 17.4 % — ABNORMAL HIGH (ref 11.5–15.5)
WBC: 14.6 10*3/uL — ABNORMAL HIGH (ref 4.0–10.5)
WBC: 14.9 10*3/uL — ABNORMAL HIGH (ref 4.0–10.5)

## 2014-10-12 LAB — LACTATE DEHYDROGENASE: LDH: 301 U/L — AB (ref 94–250)

## 2014-10-12 LAB — PROTIME-INR
INR: 1.37 (ref 0.00–1.49)
PROTHROMBIN TIME: 17 s — AB (ref 11.6–15.2)

## 2014-10-12 MED ORDER — PANTOPRAZOLE SODIUM 40 MG PO TBEC
40.0000 mg | DELAYED_RELEASE_TABLET | Freq: Every day | ORAL | Status: DC
  Administered 2014-10-13 – 2014-11-01 (×18): 40 mg via ORAL
  Filled 2014-10-12 (×18): qty 1

## 2014-10-12 MED ORDER — ACETAMINOPHEN 160 MG/5ML PO SOLN
325.0000 mg | Freq: Four times a day (QID) | ORAL | Status: DC | PRN
  Filled 2014-10-12: qty 10.2

## 2014-10-12 MED ORDER — FENTANYL CITRATE 0.05 MG/ML IJ SOLN: 25.0000 ug | Freq: Four times a day (QID) | INTRAMUSCULAR | Status: DC | PRN

## 2014-10-12 MED ORDER — POTASSIUM CHLORIDE 10 MEQ/50ML IV SOLN
10.0000 meq | INTRAVENOUS | Status: AC
  Administered 2014-10-12 (×2): 10 meq via INTRAVENOUS
  Filled 2014-10-12 (×2): qty 50

## 2014-10-12 MED ORDER — WARFARIN SODIUM 2.5 MG PO TABS
2.5000 mg | ORAL_TABLET | Freq: Every day | ORAL | Status: DC
  Administered 2014-10-12: 2.5 mg via ORAL
  Filled 2014-10-12 (×2): qty 1

## 2014-10-12 MED ORDER — INSULIN GLARGINE 100 UNIT/ML ~~LOC~~ SOLN
8.0000 [IU] | Freq: Two times a day (BID) | SUBCUTANEOUS | Status: DC
  Administered 2014-10-12: 8 [IU] via SUBCUTANEOUS
  Filled 2014-10-12 (×5): qty 0.08

## 2014-10-12 MED ORDER — OXYCODONE HCL 5 MG/5ML PO SOLN: 5.0000 mg | ORAL | Status: DC | PRN

## 2014-10-12 MED ORDER — POTASSIUM CHLORIDE 10 MEQ/50ML IV SOLN
10.0000 meq | INTRAVENOUS | Status: AC | PRN
  Administered 2014-10-12 – 2014-10-13 (×3): 10 meq via INTRAVENOUS
  Filled 2014-10-12 (×2): qty 50

## 2014-10-12 MED ORDER — RESOURCE THICKENUP CLEAR PO POWD
ORAL | Status: DC | PRN
  Filled 2014-10-12: qty 125

## 2014-10-12 MED ORDER — POTASSIUM CHLORIDE 10 MEQ/50ML IV SOLN
10.0000 meq | INTRAVENOUS | Status: AC | PRN
  Administered 2014-10-12 (×3): 10 meq via INTRAVENOUS
  Filled 2014-10-12: qty 50

## 2014-10-12 NOTE — Progress Notes (Signed)
HeartMate 2 Rounding Note  Subjective:   POD #21  VAD implant  (DT)  For acute /chronic systolic HF from ischemic cardiomyopathy and cardiogenic shock on preop IABP  POD#7 re-exploration for bleeding - coagulopathy/ oozing in pump pocket- all pump anastomoses dry  Prior hx PE- with large R pulmonary infarct,necrotic    lung/pneumonia- poss empyema on IV Zosyn- brown sputum Restrictive lung disease- FEV1 1.3 from ankylosing spondylitis and CO2 retention DM  HX ventricular, atrial arrhythmia Postoperative CO2 retention requiring BiPAP  Postoperative coagulopathy associated with INR 2.7   320 cc chest tube output in last  24 hr - leave 2 chest tubes Stable hemodynamics - low dose milrinone only NSR off epi- stop iv amiodarone 2D echo  with good RV fx, LV decompressed by VAD  Patient extubated 4-10- PCO2 up due to restrictive thoracic deformity- reduced compliance Placed on  BIPAP over night-  cough today more dry Diuresing well - lasix drip changed to daily iv dose Panda placed and TF nutrition started- up to 30/hr- post extubation swallow eval pending Low dose coumadin started -INR goal 1.5 - 2.0.  Anticoagulation difficult situation with hx of DVT and large pulmonary infarct but with severe bleeding into surgical pump pocket with INR 2.7 which required 2 re- explorations to stop Hb down today 9.4 to 8.4, CXR and drains w/o evidence of rebleeding- recheck Hb this pm  Elevated WBC  cont IV Fortaz and vancomycin- cultures all neg- suspect pulmonary source- will mobilize OOB today. WBC better - 14k  HeartMate II LVAD:  Flow 4.5 liters/min, speed 9400 rpm power 6.1, PI  5.0 , no PI events in 12 hrs  Controller intact-   Objective:    Vital Signs:   Temp:  [97.4 F (36.3 C)-98.8 F (37.1 C)] 97.5 F (36.4 C) (04/12 0723) Pulse Rate:  [60-113] 97 (04/12 0700) Resp:  [14-29] 19 (04/12 0700) SpO2:  [96 %-100 %] 100 % (04/12 0700) FiO2 (%):  [40 %] 40 % (04/12 0400) Weight:  [186 lb  11.7 oz (84.7 kg)] 186 lb 11.7 oz (84.7 kg) (04/12 0500) Last BM Date: 10/11/14 Mean arterial Pressure 78  Intake/Output:   Intake/Output Summary (Last 24 hours) at 10/12/14 0929 Last data filed at 10/12/14 0700  Gross per 24 hour  Intake 2436.1 ml  Output   2715 ml  Net -278.9 ml     Physical Exam: General:extubated , alert- sitting up in chair HEENT: normal Neck: supple.  noJVP   no bruits. No lymphadenopathy or thryomegaly appreciated. Cor: Mechanical heart sounds with LVAD hum present. Lungs: bibasilar scattered rhonchi Abdomen: soft, nontender, nondistended. No hepatosplenomegaly. No bruits or masses. Good bowel sounds. Driveline: C/D/I; securement device intact and driveline incorporated Extremities: warm well perfused,no cyanosis, clubbing, rash, edema- Neuro: responsive without focal weakness Sternal incision dry   Telemetry:nsr  Labs: Basic Metabolic Panel:  Recent Labs Lab 10/07/14 0409  10/08/14 0400  10/09/14 0625 10/09/14 1600 10/10/14 0400 10/10/14 0749 10/10/14 1504 10/11/14 0503 10/11/14 1637 10/12/14 0420  NA 138  < > 136  < >  --  134*  --  134* 135 136 136 138  K 4.1  < > 3.6  < >  --  4.2  --  3.3* 4.0 3.4* 3.6 3.3*  CL 104  < > 101  < >  --  95*  --  92* 90* 94* 91* 94*  CO2 23  --  31  --   --  32  --  36*  --  43*  --  38*  GLUCOSE 132*  < > 141*  < >  --  123*  --  191* 100* 90 120* 127*  BUN 21  < > 16  < >  --  14  --  12 13 8 13 14   CREATININE 0.89  < > 0.62  < >  --  0.51  --  0.59 0.60 0.58 0.80 0.81  CALCIUM 8.0*  --  7.7*  --   --  7.4*  --  7.3*  --  7.6*  --  8.0*  MG 1.3*  --  1.8  --  1.6  --  1.6  --   --  1.8  --   --   < > = values in this interval not displayed.  Liver Function Tests:  Recent Labs Lab 10/07/14 0409 10/08/14 0400 10/10/14 0749 10/11/14 0503 10/12/14 0420  AST 33 26 28 29  37  ALT 17 18 19 21 23   ALKPHOS 64 92 207* 196* 244*  BILITOT 2.3* 1.4* 1.2 1.5* 1.3*  PROT 4.1* 4.3* 4.8* 5.0* 4.9*  ALBUMIN  2.2* 1.9* 1.7* 1.9* 1.8*   No results for input(s): LIPASE, AMYLASE in the last 168 hours. No results for input(s): AMMONIA in the last 168 hours.  CBC:  Recent Labs Lab 10/07/14 0409  10/08/14 1605  10/09/14 1200 10/10/14 0400 10/10/14 1504 10/11/14 0503 10/11/14 1637 10/12/14 0420  WBC 26.0*  < > 21.9*  --  19.9* 20.0*  --  18.3*  --  14.6*  NEUTROABS 22.6*  --   --   --   --   --   --   --   --   --   HGB 8.5*  < > 9.0*  < > 9.2* 9.4* 10.2* 9.1* 10.2* 8.4*  HCT 24.0*  < > 26.4*  < > 27.8* 28.7* 30.0* 28.5* 30.0* 27.5*  MCV 85.1  < > 89.5  --  90.3 92.3  --  94.4  --  97.5  PLT 198  < > 170  --  186 204  --  252  --  397  < > = values in this interval not displayed.  INR:  Recent Labs Lab 10/08/14 0400 10/09/14 0625 10/10/14 0400 10/11/14 0503 10/12/14 0420  INR 1.33 1.24 1.24 1.21 1.37    Other results:  EKG:   Imaging: Dg Chest Port 1 View  10/12/2014   CLINICAL DATA:  69 year old male with left ventricular assist device. Initial encounter.  EXAM: PORTABLE CHEST - 1 VIEW  COMPARISON:  For [place] 2016 and earlier.  FINDINGS: Portable AP upright view at 0655 hours. Swan-Ganz catheter removed. Right IJ introducer sheath no longer visible. Stable visualized enteric feeding tube. Stable right side PICC line. Left chest tube and LVAD partially re- identified.  Small to moderate left pleural effusion most apparent in the upper chest. No pneumothorax identified. Stable patchy basilar opacity, greater on the right. Stable cardiac size and mediastinal contours. No overt pulmonary edema.  IMPRESSION: 1. Swan-Ganz catheter removed.  Otherwise, stable lines and tubes. 2. No pneumothorax identified. Small to moderate left pleural effusion. Stable patchy basilar opacity.   Electronically Signed   By: Genevie Ann M.D.   On: 10/12/2014 08:11   Dg Chest Port 1 View  10/11/2014   CLINICAL DATA:  Followup left ventricular assist device.  EXAM: PORTABLE CHEST - 1 VIEW  COMPARISON:  10/10/2014  and multiple previous  FINDINGS: Soft pre knee  tube enters the abdomen. Swan-Ganz catheter placed from a right internal jugular approach has its tip at the pulmonary outflow tract. Right arm PICC has its tip in the right atrium. Left thoracostomy tube is in place. Left ventricular assist device partially visible. Nasogastric tube and endotracheal tube have been removed. There slightly improved aeration in the right lower lobe and slightly worsened aeration in the left lower lobe. Small amount of pleural fluid on the left.  IMPRESSION: Endotracheal tube and nasogastric tube removed.  Left ventricular assist device partially imaged. Left chest tube in place. No pneumothorax. Small amount of pleural fluid on the left.  Right arm PICC tip in the right atrium.  Improved aeration in the right lower lobe. Worsened aeration in the left lower lobe.   Electronically Signed   By: Nelson Chimes M.D.   On: 10/11/2014 08:15   Dg Abd Portable 1v  10/10/2014   CLINICAL DATA:  Nasogastric tube placement.  EXAM: PORTABLE ABDOMEN - 1 VIEW  COMPARISON:  October 08, 2014.  FINDINGS: No evidence of bowel obstruction or ileus is noted. Distal tip of feeding tube appears to be in expected position of second portion of duodenum. Left ventricular assistance device is again noted. Surgical drain is noted. Left-sided chest tube is noted.  IMPRESSION: Distal tip of feeding tube appears to be in expected position of second portion of the duodenum.   Electronically Signed   By: Marijo Conception, M.D.   On: 10/10/2014 21:23     Medications:     Scheduled Medications: . acetaminophen (TYLENOL) oral liquid 160 mg/5 mL  650 mg Per Tube Once   Or  . acetaminophen  650 mg Rectal Once  . antiseptic oral rinse  7 mL Mouth Rinse BID  . budesonide-formoterol  2 puff Inhalation BID  . citalopram  20 mg Oral Daily  . dextromethorphan-guaiFENesin  1 tablet Oral BID  . furosemide  40 mg Intravenous Daily  . insulin aspart  0-24 Units Subcutaneous  6 times per day  . insulin glargine  8 Units Subcutaneous BID  . metoCLOPramide (REGLAN) injection  10 mg Intravenous 4 times per day  . pantoprazole  40 mg Oral QHS  . piperacillin-tazobactam (ZOSYN)  IV  3.375 g Intravenous 3 times per day  . potassium chloride  10 mEq Intravenous Q1 Hr x 2  . sodium chloride  10 mL Intravenous Q12H  . vancomycin  1,000 mg Intravenous Q12H  . warfarin  2.5 mg Oral q1800  . Warfarin - Physician Dosing Inpatient   Does not apply q1800    Infusions: . sodium chloride 20 mL/hr at 10/11/14 1400  . feeding supplement (VITAL 1.5 CAL) 1,000 mL (10/12/14 0700)  . lactated ringers 20 mL/hr at 10/11/14 2000  . milrinone 0.125 mcg/kg/min (10/12/14 0700)  . norepinephrine (LEVOPHED) Adult infusion Stopped (10/11/14 0100)    PRN Medications: sodium chloride, oxyCODONE **AND** acetaminophen, fentaNYL, levalbuterol, ondansetron (ZOFRAN) IV, potassium chloride, sodium chloride, traMADol   Assessment:  Stable hemodynamics- , VAD speed  now 9400  CO-OX >70%  Successfully extubated but needs HS BIPAP CO2 elevated from restrictive lung dz - bicarb lower after a dose of Diamox Cont IV Zosyn, vanco  for possible pneumonia, empyema from past pulmonary infarct Coumadin dose gradually increased for INR 1.5-2.0 Plan/Discussion:   Mobilize  OOB with PT Remove drains as indicated- keep pocket and L pleural today ( 320 cc) Swallow study, cont TF nutrition Resume anticoagulation slowly- inr target 1.5-2.0 for now,add asa  81 mg after 3wks to allow pump pocket space to obliterate and reassess INR goal at that time Patients condition updated and plan of care discussed daily with wife Check Pco2 after off BIPAP sev hours at mid day   I reviewed the LVAD parameters from today, and compared the results to the patient's prior recorded data.  No programming changes were made.  The LVAD is functioning within specified parameters.  The patient performs LVAD self-test daily.  LVAD  interrogation was negative for any significant power changes, alarms or PI events/speed drops.  LVAD equipment check completed and is in good working order.  Back-up equipment present.   LVAD education done on emergency procedures and precautions and reviewed exit site care.  Length of Stay: Ramona III 10/12/2014, 9:29 AM

## 2014-10-12 NOTE — Progress Notes (Signed)
Admitted 09/10/14 with worsening heart failure and cardiogenic shock.  HeartMate II LVAD implated on 09/21/14 by Dr. Darcey Nora as DT VAD.   Vital signs: Temp: 97.4 - 98.8  deg F HR: 60 - 113 SR/ST (had episodes over weekend with AF RVR) Doppler MAP:  76 - 90 O2 Sat:  96 - 100% (4L [place], BiPAP QHS)  Wt in lbs:  173 > ... 204 > 192 > 188 > 186  LVAD interrogation reveals:  Speed:  9400 Flow: 6.3 Power: 5.1 PI: 6.3 Alarms: none Events: rare PI Fixed speed:  9400 Low speed limit: 8800  Blood products: OR:   09/21/14:  1 unit PC, 2 plts, 3 FFP 10/05/14:  3 PC's, 500 cell saver 10/06/14:  2 PCs, 2 cryo, 300 cell saver     ICU:  09/22/14:   1 unit PC 10/05/14:  2 PCs, 2 FFP 10/06/14:  5 PCs, 2 FFP, 1 Plt, 1 cryo, 3 Factro VII, DDAVP  Nitric Oxide: off since Friday 10/11/14  Extubated: 10/10/14 and tolerated BiPAP overnight   Current gtts: Amiodarone 30 mg/h Milinone:  0.125 mcg/kg/min   ASA 81 mg:  Resumed 10/11/14; holding for now with drop in Hgb  Warfarin: increasing doses slowly. INR goal 1.5 - 2.0  Labs:  LDH trend: 364 > 496 (peak) >... > 280 > 286 > 312 > 301  INR trend: 1.78 >2.0>1.0>1.2 > 1.47 > 1.33 >1.24>1.24>1.24>1.37  Co-Ox:  62.3% >64>65 > 70.3 > 84 > 63 > 86%  WBC:  11.4 > 15.4 > 17.2 > ...  >27.5 > 23.0 >  20.1 > 18.3 > 14.6  Hgb: ... > 9.9> 8.4 > 8.5 > 8.9 > 9.1 > 8.4  Drive [place]:  Dressing clean, dry and intact. His wife has resumed changing dressings over the weekend. Being changed daily   Plan/Recommendations and Discharge Needs as discussed with VAD team:  1.  320 cc output from 2 existing CT's; will leave for now per Dr. Darcey Nora.  2. On 4 LPM  maintaining sats. Tolerating BiPAP at night. Trend ABGs as determined by medical team.   4. WBC trending down slowly -- pan cultures negative (final). Pt on Vanc and Zosyn.    6.  TF at goal with additional protein added per Dietary team. FEES scheduled for today.   7. No power elevations observed or  sustained. Will continue to monitor with lower INR goal d/t bleeding. Will re-check Hgb this afternoon; holding ASA for now.  8. Resume PT/OT/Cardiac Rehab to help with strength and recovery. Follow for home health needs (re: CIR, Home PT, equipment, etc.)

## 2014-10-12 NOTE — Progress Notes (Addendum)
Patient ID: Scott [name], male   DOB: [birth year], 69 y.o.   MRN: [id number] HeartMate 2 Rounding Note  Subjective:    S/P HMII LVAD on 09/20/2013.   Moved to ICU  4/5 due to low MAP and low hemoglobin. Taken Back to the OR 4/5 and 4/6  for recurrent bleed and evacuation of clots from left chest.   On 4 liters [place].  Remains on milrinone at 0.125 mcg. Weight down 2 pounds. Hgb down from 10.2 >8.4. Co-ox 76%. Still draining frol pocket drain and left pleural CT. Started on diamox yesterday to help lower bicarb and stimulate respiratory drive.  Complains of fatigue.    LVAD INTERROGATION:  HeartMate II LVAD:  Flow 5.7  liters/min, speed 9400, power 6.0 , PI 4.7, Rare PI   Objective:    Vital Signs:   Temp:  [97.4 F (36.3 C)-98.8 F (37.1 C)] 97.5 F (36.4 C) (04/12 0723) Pulse Rate:  [60-113] 97 (04/12 0700) Resp:  [14-29] 19 (04/12 0700) SpO2:  [96 %-100 %] 100 % (04/12 0700) FiO2 (%):  [40 %] 40 % (04/12 0400) Weight:  [186 lb 11.7 oz (84.7 kg)] 186 lb 11.7 oz (84.7 kg) (04/12 0500) Last BM Date: 10/11/14 Mean arterial Pressure  70s  Intake/Output:   Intake/Output Summary (Last 24 hours) at 10/12/14 0752 Last data filed at 10/12/14 0700  Gross per 24 hour  Intake 2667.9 ml  Output   3145 ml  Net -477.1 ml     Physical Exam: CVP 5 General: Sitting in chair  HEENT: normal Neck: supple. Carotids 2+ bilat; no bruits. No lymphadenopathy or thryomegaly appreciated. R neck Swan  Cor: Mechanical heart sounds with LVAD hum present. Lungs: RML RLL LLL crackles. On 4 liters [place].  Abdomen: soft, nontender, nondistended. No hepatosplenomegaly. No bruits or masses. Good bowel sounds. Driveline: Dressing looks good.  Extremities: no cyanosis, clubbing, rash.  Trace edema Neuro: Awake alert GU: foley   Telemetry: NSR 70-80s   Labs: Basic Metabolic Panel:  Recent Labs Lab 10/07/14 0409  10/08/14 0400  10/09/14 0625 10/09/14 1600 10/10/14 0400 10/10/14 0749  10/10/14 1504 10/11/14 0503 10/11/14 1637 10/12/14 0420  NA 138  < > 136  < >  --  134*  --  134* 135 136 136 138  K 4.1  < > 3.6  < >  --  4.2  --  3.3* 4.0 3.4* 3.6 3.3*  CL 104  < > 101  < >  --  95*  --  92* 90* 94* 91* 94*  CO2 23  --  31  --   --  32  --  36*  --  43*  --  38*  GLUCOSE 132*  < > 141*  < >  --  123*  --  191* 100* 90 120* 127*  BUN 21  < > 16  < >  --  14  --  12 13 8 13 14   CREATININE 0.89  < > 0.62  < >  --  0.51  --  0.59 0.60 0.58 0.80 0.81  CALCIUM 8.0*  --  7.7*  --   --  7.4*  --  7.3*  --  7.6*  --  8.0*  MG 1.3*  --  1.8  --  1.6  --  1.6  --   --  1.8  --   --   < > = values in this interval not displayed.  Liver Function Tests:  Recent Labs  Lab 10/07/14 0409 10/08/14 0400 10/10/14 0749 10/11/14 0503 10/12/14 0420  AST 33 26 28 29  37  ALT 17 18 19 21 23   ALKPHOS 64 92 207* 196* 244*  BILITOT 2.3* 1.4* 1.2 1.5* 1.3*  PROT 4.1* 4.3* 4.8* 5.0* 4.9*  ALBUMIN 2.2* 1.9* 1.7* 1.9* 1.8*   No results for input(s): LIPASE, AMYLASE in the last 168 hours. No results for input(s): AMMONIA in the last 168 hours.  CBC:  Recent Labs Lab 10/07/14 0409  10/08/14 1605  10/09/14 1200 10/10/14 0400 10/10/14 1504 10/11/14 0503 10/11/14 1637 10/12/14 0420  WBC 26.0*  < > 21.9*  --  19.9* 20.0*  --  18.3*  --  14.6*  NEUTROABS 22.6*  --   --   --   --   --   --   --   --   --   HGB 8.5*  < > 9.0*  < > 9.2* 9.4* 10.2* 9.1* 10.2* 8.4*  HCT 24.0*  < > 26.4*  < > 27.8* 28.7* 30.0* 28.5* 30.0* 27.5*  MCV 85.1  < > 89.5  --  90.3 92.3  --  94.4  --  97.5  PLT 198  < > 170  --  186 204  --  252  --  397  < > = values in this interval not displayed.  INR:  Recent Labs Lab 10/08/14 0400 10/09/14 0625 10/10/14 0400 10/11/14 0503 10/12/14 0420  INR 1.33 1.24 1.24 1.21 1.37    Other results:    Imaging: Dg Chest Port 1 View  10/11/2014   CLINICAL DATA:  Followup left ventricular assist device.  EXAM: PORTABLE CHEST - 1 VIEW  COMPARISON:  10/10/2014  and multiple previous  FINDINGS: Soft pre knee tube enters the abdomen. Swan-Ganz catheter placed from a right internal jugular approach has its tip at the pulmonary outflow tract. Right arm PICC has its tip in the right atrium. Left thoracostomy tube is in place. Left ventricular assist device partially visible. Nasogastric tube and endotracheal tube have been removed. There slightly improved aeration in the right lower lobe and slightly worsened aeration in the left lower lobe. Small amount of pleural fluid on the left.  IMPRESSION: Endotracheal tube and nasogastric tube removed.  Left ventricular assist device partially imaged. Left chest tube in place. No pneumothorax. Small amount of pleural fluid on the left.  Right arm PICC tip in the right atrium.  Improved aeration in the right lower lobe. Worsened aeration in the left lower lobe.   Electronically Signed   By: Nelson Chimes M.D.   On: 10/11/2014 08:15   Dg Abd Portable 1v  10/10/2014   CLINICAL DATA:  Nasogastric tube placement.  EXAM: PORTABLE ABDOMEN - 1 VIEW  COMPARISON:  October 08, 2014.  FINDINGS: No evidence of bowel obstruction or ileus is noted. Distal tip of feeding tube appears to be in expected position of second portion of duodenum. Left ventricular assistance device is again noted. Surgical drain is noted. Left-sided chest tube is noted.  IMPRESSION: Distal tip of feeding tube appears to be in expected position of second portion of the duodenum.   Electronically Signed   By: Marijo Conception, M.D.   On: 10/10/2014 21:23     Medications:     Scheduled Medications: . acetaminophen (TYLENOL) oral liquid 160 mg/5 mL  650 mg Per Tube Once   Or  . acetaminophen  650 mg Rectal Once  . antiseptic oral rinse  7 mL  Mouth Rinse BID  . aspirin  81 mg Oral Daily  . budesonide-formoterol  2 puff Inhalation BID  . citalopram  20 mg Oral Daily  . dextromethorphan-guaiFENesin  1 tablet Oral BID  . furosemide  40 mg Intravenous Daily  . insulin  aspart  0-24 Units Subcutaneous 6 times per day  . insulin glargine  10 Units Subcutaneous QHS  . metoCLOPramide (REGLAN) injection  10 mg Intravenous 4 times per day  . pantoprazole sodium  40 mg Per Tube QHS  . piperacillin-tazobactam (ZOSYN)  IV  3.375 g Intravenous 3 times per day  . sodium chloride  10 mL Intravenous Q12H  . vancomycin  1,000 mg Intravenous Q12H  . warfarin  4 mg Oral q1800  . Warfarin - Physician Dosing Inpatient   Does not apply q1800    Infusions: . sodium chloride 20 mL/hr at 10/11/14 1400  . amiodarone 30 mg/hr (10/12/14 0700)  . feeding supplement (VITAL 1.5 CAL) 1,000 mL (10/12/14 0700)  . lactated ringers 20 mL/hr at 10/11/14 2000  . milrinone 0.125 mcg/kg/min (10/12/14 0700)  . norepinephrine (LEVOPHED) Adult infusion Stopped (10/11/14 0100)    PRN Medications: sodium chloride, fentaNYL, levalbuterol, ondansetron (ZOFRAN) IV, oxyCODONE, potassium chloride, sodium chloride, traMADol   Assessment:    1.  A/C systolic HF class IV with cardiogenic shock- EF 15% with moderate RV dysfunction 3/16 echo.         -S/P HMII LVAD 09/21/14  2. CAD s/p Anterior STEMI on 06/12/14 with stenting of LAD- was on coumadin + plavix, statin.  Now holding coumadin, off Plavix.  3. Acute on chronic hypercarbic respiratory failure  4. 06/12/14 VT in setting of STEMI 5. Ankylosing spondylitis. 6. Bilateral PE with R DVT- S/P IVC filter 08/27/2014--->coumadin 7. Acute Blood Loss into pleural space:  Back to OR 4/5 and 4/6 for recurrent bleed. Received multiple blood products.  8. PAF: On amiodarone.  Plan/Discussion:   S/P 09/21/2014 HMII LVAD.   Back to OR 4/5 and and again 4/6 for recurrent bleed and evacuation clots. Received multiple blood products. Hemoglobin trending down today from 10.2>8.4. Will check CBC at 1300. Per Dr Darcey Nora if < 8 will transfuse.   Off vent. On Bipap. PCO2 in upper normal range for him. Continue nightly bipap.  On 4 liters [place].   CO-OX 76%  on  milrinone to 0.125. Consider stopped today. Continue lasix 40 mg daily.   H/o VT and atrial fibrillation, no further atrial fibrillation.  On amiodarone gtt.  Supp K and magnesium.   On coumadin. INR 1.37. Goal INR 1.5-2.   Will not restart Plavix as anterior wall completely necrotic.   LVAD parameters stable.    I reviewed the LVAD parameters from today, and compared the results to the patient's prior recorded data.  No programming changes were made.  The LVAD is functioning within specified parameters.  The patient performs LVAD self-test daily.  LVAD interrogation was negative for any significant power changes, alarms or PI events/speed drops.  LVAD equipment check completed and is in good working order.  Back-up equipment present.   LVAD education done on emergency procedures and precautions and reviewed exit site care.   Length of Stay: 59  CLEGG,AMY  NP-C  10/12/2014, 7:52 AM  VAD Team --- VAD ISSUES ONLY--- Pager [phone] (7am - 7am)  Advanced Heart Failure Team  Pager [phone] (M-F; 7a - 4p)  Please contact [facility] for night-coverage after hours (4p -7a ) and weekends on [email or web address]  Patient seen and examined with Darrick Grinder, NP. We discussed all aspects of the encounter. I agree with the assessment and plan as stated above.   Still with drainage from L chest rube and pocket drain. Hgb down slightly. Co-ox improved. Bicarb decreasing on acetazolamide.   Will stop milrinone. Continue to watch Hgb closely. Continue coumadin. Would hold ASA for now - if Ok with Dr. [name].   Leukocytosis improving on abx.   VAD parameters stable.   The patient is critically ill with multiple organ systems failure and requires high complexity decision making for assessment and support, frequent evaluation and titration of therapies, application of advanced monitoring technologies and extensive interpretation of multiple databases.   Critical Care Time personally devoted to patient  care services described in this note is 35 Minutes.  Hayze Ciarra Braddy,MD 5:52 PM

## 2014-10-12 NOTE — Progress Notes (Signed)
Physical Therapy Treatment Patient Details Name: Scott [name] MRN: [id number] DOB: 02-Aug-1945 Today's Date: 10/12/2014    History of Present Illness 69 y.o. M with extensive cardiac hx as well as recent PE / DVT, admitted for SOB due to heart failure, cardiogenic, shock, PE. Evaluated by VAD team. Found to have mod - large R pleural effusion. LVAD implantation 09/22/14. Progressed well until developed hemothorax and required return to OR 4/5 and 4/6 for Redo sternotomy, reexploration of chest and removal of hematoma.    PT Comments    Pt feels overall fatigued, however was able to ambulate short distances today (6 ft, 45ft). Limited by PI decreasing 6.1 to 2.1 (pt asymptomatic, but returned to sitting) and then 6.1 to 3.7 (again asymptomatic). Pt discouraged and encouragement provided. Noted Lt foot drop that pt reports is new since his last surgeries. No active DF palpable and may need Lt ankle AFO to prevent foot drop/tripping when ambulating. Will continue to reassess.   Follow Up Recommendations  Home health PT;Supervision/Assistance - 24 hour     Equipment Recommendations   (2 wheeled RW with fold down seat)    Recommendations for Other Services       Precautions / Restrictions Precautions Precautions: Sternal;Fall Precaution Comments: LVAD    Mobility  Bed Mobility Overal bed mobility: Needs Assistance Bed Mobility: Supine to Sit     Supine to sit: HOB elevated;Mod assist;+2 for physical assistance;+2 for safety/equipment Sit to supine: Mod assist;+2 for safety/equipment   General bed mobility comments: Pt required min A to move LEs off EOB and assist to lift trunk from bed into upright posture and assist to scoot hips to EOB Rolling avoided due to bil chest tubes and discomfort  Transfers Overall transfer level: Needs assistance Equipment used: Rolling walker (2 wheeled) Transfers: Sit to/from Stand Sit to Stand: Min assist;+2 physical assistance;+2  safety/equipment Stand pivot transfers: Min assist;+2 physical assistance       General transfer comment: Pt requires assist to move into standing and assist to steady   Ambulation/Gait Ambulation/Gait assistance: Min assist;+2 physical assistance;+2 safety/equipment Ambulation Distance (Feet): 6 Feet (seated recovery; 5 ft) Assistive device: Rolling walker (2 wheeled) Gait Pattern/deviations: Step-through pattern;Decreased stride length   Gait velocity interpretation: Below normal speed for age/gender General Gait Details: limited due to PI dropping significantly (pt asymptomatic)   Stairs            Wheelchair Mobility    Modified Rankin (Stroke Patients Only)       Balance   Sitting-balance support: No upper extremity supported Sitting balance-Leahy Scale: Fair     Standing balance support: Bilateral upper extremity supported Standing balance-Leahy Scale: Poor Standing balance comment: reliant on bil. UE support                     Cognition Arousal/Alertness: Awake/alert Behavior During Therapy: Flat affect Overall Cognitive Status: Within Functional Limits for tasks assessed                      Exercises      General Comments General comments (skin integrity, edema, etc.): Wife present during session. First attempt, PI dropped from 6.1 to 2.1. Pt asymptomatic and moved to sitting position. After a seated rest break, pt ambulated again PI dropped from 6.1 to 3.7. Pt moved to seated position. All other vitals remained stable. Pt fatigued at end of session      Pertinent Vitals/Pain Pain Assessment: Faces Faces Pain  Scale: Hurts little more Pain Location: Lt chest Pain Descriptors / Indicators: Aching;Guarding Pain Intervention(s): Limited activity within patient's tolerance;Monitored during session;Repositioned    Home Living Family/patient expects to be discharged to:: Private residence Living Arrangements: Spouse/significant  other Available Help at Discharge: Family;Available 24 hours/day Type of Home: House Home Access: Stairs to enter Entrance Stairs-Rails: None Home Layout: Two level;Able to live on main level with bedroom/bathroom Home Equipment: None      Prior Function Level of Independence: Independent          PT Goals (current goals can now be found in the care plan section) Acute Rehab PT Goals Patient Stated Goal: to go home  Time For Goal Achievement: 10/25/14 Progress towards PT goals: Progressing toward goals    Frequency  Min 3X/week    PT Plan Current plan remains appropriate    Co-evaluation PT/OT/SLP Co-Evaluation/Treatment: Yes Reason for Co-Treatment: Complexity of the patient's impairments (multi-system involvement);For patient/therapist safety PT goals addressed during session: Mobility/safety with mobility;Proper use of DME;Strengthening/ROM;Balance OT goals addressed during session: Strengthening/ROM;ADL's and self-care     End of Session Equipment Utilized During Treatment: Oxygen Activity Tolerance: Patient limited by fatigue Patient left: in chair;with call bell/phone within reach;with family/visitor present     Time: 5945-8592 PT Time Calculation (min) (ACUTE ONLY): 40 min  Charges:  $Gait Training: 8-22 mins $Therapeutic Activity: 8-22 mins                    G Codes:      Eryk Beavers Oct 21, 2014, 3:07 PM

## 2014-10-12 NOTE — Evaluation (Signed)
Occupational Therapy Evaluation Patient Details Name: Scott [name] MRN: [id number] DOB: 1946-06-30 Today's Date: 10/12/2014    History of Present Illness 69 y.o. M with extensive cardiac hx as well as recent PE / DVT, admitted for SOB due to heart failure, cardiogenic, shock, PE. Evaluated by VAD team. Found to have mod - large R pleural effusion. LVAD implantation 09/22/14. Progressed well until developed hemothorax and required return to OR 4/5 and 4/6 for Redo sternotomy, reexploration of chest and removal of hematoma.   Clinical Impression   Pt admitted with above. He demonstrates the below listed deficits and will benefit from continued OT to maximize safety and independence with BADLs.  Pt demonstrates generalized weakness/deconditioning.  Currently, he requires max - total A for BADLs and min A +2 for functional mobility, but fatigues quickly.  PI dropped from 6.1-2.1 with short distance ambulation.  After seated rest break, ambulated ~4' again with PI dropping from 6.1 - 3.7.  All other vitals remained stable.       Follow Up Recommendations  Home health OT (depending on progress)    Equipment Recommendations  3 in 1 bedside comode;Tub/shower seat    Recommendations for Other Services       Precautions / Restrictions Precautions Precautions: Sternal;Fall Precaution Comments: LVAD      Mobility Bed Mobility Overal bed mobility: Needs Assistance Bed Mobility: Supine to Sit     Supine to sit: HOB elevated;Mod assist;+2 for physical assistance;+2 for safety/equipment Sit to supine: Mod assist;+2 for safety/equipment   General bed mobility comments: Pt required min A to move LEs off EOB and assist to lift trunk from bed into upright posture and assist to scoot hips to EOB   Transfers Overall transfer level: Needs assistance Equipment used: Rolling walker (2 wheeled) Transfers: Sit to/from [employer] Sit to Stand: Min assist;+2 physical  assistance Stand pivot transfers: Min assist;+2 physical assistance       General transfer comment: Pt requires assist to move into standing and assist to steady     Balance   Sitting-balance support: No upper extremity supported Sitting balance-Leahy Scale: Fair     Standing balance support: Bilateral upper extremity supported Standing balance-Leahy Scale: Poor Standing balance comment: reliant on bil. UE support                             ADL Overall ADL's : Needs assistance/impaired Eating/Feeding: Set up;Sitting   Grooming: Wash/dry hands;Wash/dry face;Oral care;Brushing hair;Set up;Supervision/safety;Sitting   Upper Body Bathing: Maximal assistance;Sitting   Lower Body Bathing: Maximal assistance;Sit to/from stand   Upper Body Dressing : Total assistance;Sitting   Lower Body Dressing: Total assistance;Sit to/from stand   Toilet Transfer: Minimal assistance;+2 for physical assistance;Ambulation;BSC;RW   Toileting- Clothing Manipulation and Hygiene: Total assistance;Sit to/from stand       Functional mobility during ADLs: Minimal assistance;+2 for physical assistance;Rolling walker General ADL Comments: Pt limited by fatigue as well as multiple lines and tubes      Vision     Perception     Praxis      Pertinent Vitals/Pain Pain Assessment: Faces Faces Pain Scale: Hurts little more Pain Location: Lt chest  Pain Descriptors / Indicators: Aching;Guarding Pain Intervention(s): Monitored during session     Hand Dominance Right   Extremity/Trunk Assessment Upper Extremity Assessment Upper Extremity Assessment: Generalized weakness;RUE deficits/detail RUE Deficits / Details: Pt now demonstrates full AROM Lt index finger.  Pt reports sensation on  tip feels dull, but no longer with pins and needles sensation.   No increased edema    Lower Extremity Assessment Lower Extremity Assessment: Defer to PT evaluation   Cervical / Trunk  Assessment Cervical / Trunk Assessment: Normal   Communication Communication Communication: No difficulties   Cognition Arousal/Alertness: Awake/alert Behavior During Therapy: Flat affect Overall Cognitive Status: Within Functional Limits for tasks assessed                     General Comments       Exercises       Shoulder Instructions      Home Living Family/patient expects to be discharged to:: Private residence Living Arrangements: Spouse/significant other Available Help at Discharge: Family;Available 24 hours/day Type of Home: House Home Access: Stairs to enter [employer] of Steps: 2 Entrance Stairs-Rails: None Home Layout: Two level;Able to live on main level with bedroom/bathroom     Bathroom Shower/Tub: Tub/shower unit Shower/tub characteristics: Architectural technologist: Standard     Home Equipment: None          Prior Functioning/Environment Level of Independence: Independent             OT Diagnosis: Generalized weakness;Acute pain   OT Problem List: Decreased strength;Decreased activity tolerance;Impaired balance (sitting and/or standing);Decreased knowledge of use of DME or AE;Cardiopulmonary status limiting activity   OT Treatment/Interventions: Self-care/ADL training;Energy conservation;Therapeutic activities;Patient/family education;Balance training    OT Goals(Current goals can be found in the care plan section) Acute Rehab OT Goals Patient Stated Goal: to go home  OT Goal Formulation: With patient Time For Goal Achievement: 10/26/14 Potential to Achieve Goals: Good  OT Frequency: Min 2X/week   Barriers to D/C:            Co-evaluation PT/OT/SLP Co-Evaluation/Treatment: Yes Reason for Co-Treatment: Complexity of the patient's impairments (multi-system involvement)   OT goals addressed during session: Strengthening/ROM;ADL's and self-care      End of Session Equipment Utilized During Treatment: Oxygen Nurse  Communication: Mobility status  Activity Tolerance: Patient limited by fatigue Patient left: in bed;with call bell/phone within reach;with family/visitor present   Time: 2902-1115 OT Time Calculation (min): 39 min Charges:  OT General Charges $OT Visit: 1 Procedure OT Evaluation $Initial OT Evaluation Tier I: 1 Procedure G-Codes:    Lucille Passy M 11/06/2014, 1:59 PM

## 2014-10-12 NOTE — Procedures (Addendum)
Objective Swallowing Evaluation: Fiberoptic Endoscopic Evaluation of Swallowing  Patient Details  Name: Scott [name] MRN: [id number] Date of Birth: 1945-08-28  Today's Date: 10/12/2014 Time: SLP Start Time (ACUTE ONLY): 1425-SLP Stop Time (ACUTE ONLY): 1507 SLP Time Calculation (min) (ACUTE ONLY): 42 min  Past Medical History:  Past Medical History  Diagnosis Date  . Hyperlipidemia   . Hypertension   . Diabetes   . Nephrolithiasis   . Ankylosing spondylitis   . CAD (coronary artery disease)     a. stenting of LCx 2013; b. STEMI 06/12/14 s/p PCI to LAD complicated by post cath shock requiring IABP; VT s/p DCCV, EF 20%; c. NSTEMI 06/26/14 treated medically.  . Ischemic cardiomyopathy     a. echo 08/23/2014 EF <20%, dilated CM, mod MR/TR  . Acute on chronic respiratory failure     a. 08/2014 in setting of PE.  . Bilateral pulmonary embolism     a. 08/2014 - started on Coumadin. Retrievable IVC filter placed 08/27/14 due to RV strain and large clot burden.  . Right leg DVT     a. 08/2014.  Marland Kitchen Chronic systolic CHF (congestive heart failure)   . Hypotension   . Hemoptysis     a. 08/2014 possibly due to pulm infarct/PE.  Marland Kitchen Pleural effusion on right 08/2014 - small  . Leukocytosis   . Carotid artery disease     a. s/p stenting.  . Ventricular tachycardia     a. 06/2014 at time of MI, s/p DCCV.  Marland Kitchen Reactive thrombocytosis 09/10/2014  . Leukocytosis 09/10/2014   Past Surgical History:  Past Surgical History  Procedure Laterality Date  . Left heart catheterization with coronary angiogram N/A 06/12/2014    Procedure: LEFT HEART CATHETERIZATION WITH CORONARY ANGIOGRAM;  Surgeon: Lorretta Harp, MD;  Location: [facility] CATH LAB;  Service: Cardiovascular;  Laterality: N/A;  . Right heart catheterization N/A 09/10/2014    Procedure: RIGHT HEART CATH;  Surgeon: [name], MD;  Location: [facility] CATH LAB;  Service: Cardiovascular;  Laterality: N/A;  . Right heart catheterization N/A 09/17/2014     Procedure: RIGHT HEART CATH;  Surgeon: [name], MD;  Location: [facility] CATH LAB;  Service: Cardiovascular;  Laterality: N/A;  . Insertion of implantable left ventricular assist device N/A 09/21/2014    Procedure: INSERTION OF IMPLANTABLE LEFT VENTRICULAR ASSIST DEVICE;  Surgeon: Ivin Poot, MD;  Location: [place];  Service: Open Heart Surgery;  Laterality: N/A;  CIRC ARREST  NITRIC OXIDE  . Tee without cardioversion N/A 09/21/2014    Procedure: TRANSESOPHAGEAL ECHOCARDIOGRAM (TEE);  Surgeon: Ivin Poot, MD;  Location: [place];  Service: Open Heart Surgery;  Laterality: N/A;  . Tee without cardioversion N/A 10/05/2014    Procedure: TRANSESOPHAGEAL ECHOCARDIOGRAM (TEE);  Surgeon: Ivin Poot, MD;  Location: [place];  Service: Open Heart Surgery;  Laterality: N/A;  . Bedside evacuation of hematoma  10/05/2014    Procedure: EVACUATION OF HEMATOMA;  Surgeon: Ivin Poot, MD;  Location: [place];  Service: Open Heart Surgery;;  . Video assisted thoracoscopy (vats)/thorocotomy Left 10/06/2014    Procedure: VIDEO ASSISTED THORACOSCOPY (VATS)/THOROCOTOMY;  Surgeon: Ivin Poot, MD;  Location: [place];  Service: Open Heart Surgery;  Laterality: Left;   HPI:  69 y.o. M with extensive cardiac hx as well as recent PE / DVT, admitted for SOB due to heart failure, cardiogenic, shock, PE. Evaluated by VAD team. Found to have mod - large R pleural effusion. LVAD implantation 09/22/14. Progressed well  until developed hemothorax and required return to OR 4/5 and 4/6 for Redo sternotomy, reexploration of chest and removal of hematoma.   Assessment / Plan / Recommendation CHL IP CLINICAL IMPRESSIONS 10/12/2014  Dysphagia Diagnosis Moderate pharyngeal phase dysphagia  Clinical impression Patient presents with a moderate oropharyngeal dysphagia characterized by base of tongue, laryngeal, and pharyngeal weakness and sensory deficits s/p mulitple intubations and deconditioning, resulting in decreased  laryngeal closure and airway protection. Penetration of both thin and nectar thick liquids noted, not prevented with chin tuck, prevented with use of teaspoon boluses of nectar thick liquid. Multiple swallows aids in clearance of moderate pharyngeal residuals. Aspiration risk moderate, reduced with use of compensatory strategies and conservative diet. Will f/u closely.       CHL IP TREATMENT RECOMMENDATION 10/12/2014  Treatment Plan Recommendations Therapy as outlined in treatment plan below     CHL IP DIET RECOMMENDATION 10/12/2014  Diet Recommendations Dysphagia 2 (Fine chop);Nectar-thick liquid  Liquid Administration via Spoon  Medication Administration Crushed with puree  Compensations Slow rate;Small sips/bites;Multiple dry swallows after each bite/sip  Postural Changes and/or Swallow Maneuvers Seated upright 90 degrees;Upright 30-60 min after meal     CHL IP OTHER RECOMMENDATIONS 10/12/2014  Recommended Consults (None)  Oral Care Recommendations Oral care BID  Other Recommendations Order thickener from pharmacy;Prohibited food (jello, ice cream, thin soups);Remove water pitcher     CHL IP FOLLOW UP RECOMMENDATIONS 10/12/2014  Follow up Recommendations Inpatient Rehab     CHL IP FREQUENCY AND DURATION 10/12/2014  Speech Therapy Frequency (ACUTE ONLY) min 2x/week  Treatment Duration 2 weeks         SLP Swallow Goals No flowsheet data found.  No flowsheet data found.    CHL IP REASON FOR REFERRAL 10/12/2014  Reason for Referral Objectively evaluate swallowing function     CHL IP ORAL PHASE 10/12/2014  Lips (None)  Tongue (None)  Mucous membranes (None)  Nutritional status (None)  Other (None)  Oxygen therapy (None)  Oral Phase WFL  Oral - Pudding Teaspoon (None)  Oral - Pudding Cup (None)  Oral - Honey Teaspoon (None)  Oral - Honey Cup (None)  Oral - Honey Syringe (None)  Oral - Nectar Teaspoon (None)  Oral - Nectar Cup (None)  Oral - Nectar Straw (None)  Oral - Nectar  Syringe (None)  Oral - Ice Chips (None)  Oral - Thin Teaspoon (None)  Oral - Thin Cup (None)  Oral - Thin Straw (None)  Oral - Thin Syringe (None)  Oral - Puree (None)  Oral - Mechanical Soft (None)  Oral - Regular (None)  Oral - Multi-consistency (None)  Oral - Pill (None)  Oral Phase - Comment (None)      CHL IP PHARYNGEAL PHASE 10/12/2014  Pharyngeal Phase Impaired  Pharyngeal - Pudding Teaspoon (None)  Penetration/Aspiration details (pudding teaspoon) (None)  Pharyngeal - Pudding Cup (None)  Penetration/Aspiration details (pudding cup) (None)  Pharyngeal - Honey Teaspoon (None)  Penetration/Aspiration details (honey teaspoon) (None)  Pharyngeal - Honey Cup (None)  Penetration/Aspiration details (honey cup) (None)  Pharyngeal - Honey Syringe (None)  Penetration/Aspiration details (honey syringe) (None)  Pharyngeal - Nectar Teaspoon Delayed swallow initiation;Premature spillage to valleculae;Reduced airway/laryngeal closure;Reduced tongue base retraction;Reduced laryngeal elevation;Reduced anterior laryngeal mobility;Pharyngeal residue - valleculae;Pharyngeal residue - pyriform sinuses  Penetration/Aspiration details (nectar teaspoon) (None)  Pharyngeal - Nectar Cup Delayed swallow initiation;Premature spillage to valleculae;Reduced airway/laryngeal closure;Reduced tongue base retraction;Reduced laryngeal elevation;Reduced anterior laryngeal mobility;Pharyngeal residue - valleculae;Pharyngeal residue - pyriform sinuses  Penetration/Aspiration details (nectar  cup) (None)  Pharyngeal - Nectar Straw (None)  Penetration/Aspiration details (nectar straw) (None)  Pharyngeal - Nectar Syringe (None)  Penetration/Aspiration details (nectar syringe) (None)  Pharyngeal - Ice Chips (None)  Penetration/Aspiration details (ice chips) (None)  Pharyngeal - Thin Teaspoon Delayed swallow initiation;Premature spillage to pyriform sinuses;Reduced tongue base retraction;Reduced airway/laryngeal  closure;Reduced laryngeal elevation;Reduced anterior laryngeal mobility;Pharyngeal residue - valleculae;Pharyngeal residue - pyriform sinuses  Penetration/Aspiration details (thin teaspoon) Material does not enter airway  Pharyngeal - Thin Cup Delayed swallow initiation;Premature spillage to pyriform sinuses;Reduced anterior laryngeal mobility;Reduced laryngeal elevation;Reduced tongue base retraction;Reduced airway/laryngeal closure;Penetration/Aspiration during swallow;Pharyngeal residue - valleculae;Pharyngeal residue - pyriform sinuses  Penetration/Aspiration details (thin cup) Material enters airway, CONTACTS cords and not ejected out  Pharyngeal - Thin Straw (None)  Penetration/Aspiration details (thin straw) (None)  Pharyngeal - Thin Syringe (None)  Penetration/Aspiration details (thin syringe') (None)  Pharyngeal - Puree Delayed swallow initiation;Premature spillage to valleculae;Reduced laryngeal elevation;Reduced anterior laryngeal mobility;Reduced airway/laryngeal closure;Reduced tongue base retraction;Pharyngeal residue - valleculae;Pharyngeal residue - pyriform sinuses  Penetration/Aspiration details (puree) (None)  Pharyngeal - Mechanical Soft (None)  Penetration/Aspiration details (mechanical soft) (None)  Pharyngeal - Regular Delayed swallow initiation;Premature spillage to valleculae;Pharyngeal residue - valleculae;Reduced tongue base retraction;Reduced airway/laryngeal closure;Reduced laryngeal elevation;Reduced anterior laryngeal mobility  Penetration/Aspiration details (regular) (None)  Pharyngeal - Multi-consistency (None)  Penetration/Aspiration details (multi-consistency) (None)  Pharyngeal - Pill (None)  Penetration/Aspiration details (pill) (None)  Pharyngeal Comment (None)     No flowsheet data found.  No flowsheet data found.  [place], CCC-SLP [phone]        Gabriel Rainwater Meryl 10/12/2014, 3:56 PM

## 2014-10-12 NOTE — Progress Notes (Signed)
NUTRITION FOLLOW UP  Intervention:    Recommend increasing Vital 1.5 formula to goal rate of 50 ml/hr with Prostat liquid protein 30 ml TID via tube to provide 2100 kcal, 126 grams of protein, and 917 ml of water RD to follow for nutrition care plan  Nutrition Dx:   Increased nutrient needs related to LVAD as evidenced by estimated energy needs, ongoing  Goal:   Pt to meet >/= 90% of estimated energy needs, currently unmet  Monitor:   TF regimen & tolerance, PO diet advancement, weight, labs, I/O's  Assessment:   69 y/o with history of CAD with stenting of LCX 2013, DM2, carotid stenting and ankylosing spondylitis. Presented with an anterolateral MI in 12/15 and was discharged with a life vest and medical therapy. Pt was admitted with worsening heart failure symptoms and has undergone right heart catheterization and is now going to be started on Milrinone and then LVAD.   Prior to intubation, pt was consuming 50-100% of meals on a regular diet. Ensure supplements were ordered, however, pt refused them due to preferring Magic Cups.    Patient s/p procedures 4/6: LEFT THORACOTOMY  EVACUATION OF LEFT CHEST HEMATOMA RE-EXPLORATION FOR BLEEDING  Patient extubated 4/10.  On nocturnal BiPAP.  Vital 1.5 formula currently infusing at goal rate of 30 ml/hr via Panda tube (tip at the duodenal/jejunal junction) providing 1080 kcals, 49 gm protein, 550 ml of free water.    Speech Path following.  FEES pending for possible PO diet advancement.  Height: Ht Readings from Last 1 Encounters:  09/30/14 5\' 8"  (1.727 m)    Weight Status:   Wt Readings from Last 1 Encounters:  10/12/14 186 lb 11.7 oz (84.7 kg)   09/29/14 184 lb 8.4 oz (83.7 kg)   09/24/14 192 lb 7.4 oz (87.3 kg)            Body mass index is 28.4 kg/(m^2).  Re-estimated needs:  Kcal: 1900-2100 Protein: 125-135 grams Fluid: >2.1 L  Skin: closed lt chest incision, closed rt abdominal incision  Diet Order:  Diet NPO time specified   Intake/Output Summary (Last 24 hours) at 10/12/14 1210 Last data filed at 10/12/14 0700  Gross per 24 hour  Intake 1916.4 ml  Output   1455 ml  Net  461.4 ml    Labs:   Recent Labs Lab 10/09/14 0625  10/10/14 0400 10/10/14 0749  10/11/14 0503 10/11/14 1637 10/12/14 0420  NA  --   < >  --  134*  < > 136 136 138  K  --   < >  --  3.3*  < > 3.4* 3.6 3.3*  CL  --   < >  --  92*  < > 94* 91* 94*  CO2  --   < >  --  36*  --  43*  --  38*  BUN  --   < >  --  12  < > 8 13 14   CREATININE  --   < >  --  0.59  < > 0.58 0.80 0.81  CALCIUM  --   < >  --  7.3*  --  7.6*  --  8.0*  MG 1.6  --  1.6  --   --  1.8  --   --   GLUCOSE  --   < >  --  191*  < > 90 120* 127*  < > = values in this interval not displayed.  CBG (last 3)  Recent Labs  10/12/14 0017 10/12/14 0423 10/12/14 0721  GLUCAP 116* 121* 105*    Scheduled Meds: . acetaminophen (TYLENOL) oral liquid 160 mg/5 mL  650 mg Per Tube Once   Or  . acetaminophen  650 mg Rectal Once  . antiseptic oral rinse  7 mL Mouth Rinse BID  . budesonide-formoterol  2 puff Inhalation BID  . citalopram  20 mg Oral Daily  . dextromethorphan-guaiFENesin  1 tablet Oral BID  . furosemide  40 mg Intravenous Daily  . insulin aspart  0-24 Units Subcutaneous 6 times per day  . insulin glargine  8 Units Subcutaneous BID  . metoCLOPramide (REGLAN) injection  10 mg Intravenous 4 times per day  . pantoprazole  40 mg Oral QHS  . piperacillin-tazobactam (ZOSYN)  IV  3.375 g Intravenous 3 times per day  . potassium chloride  10 mEq Intravenous Q1 Hr x 2  . sodium chloride  10 mL Intravenous Q12H  . vancomycin  1,000 mg Intravenous Q12H  . warfarin  2.5 mg Oral q1800  . Warfarin - Physician Dosing Inpatient   Does not apply q1800    Continuous Infusions: . sodium chloride 20 mL/hr at 10/11/14 1400  . feeding supplement (VITAL 1.5 CAL) 1,000 mL (10/12/14 0700)  . lactated ringers 20 mL/hr at 10/11/14 2000  .  milrinone 0.125 mcg/kg/min (10/12/14 0700)  . norepinephrine (LEVOPHED) Adult infusion Stopped (10/11/14 0100)    Arthur Holms, RD, LDN Pager #: [phone] After-Hours Pager #: [phone]

## 2014-10-12 NOTE — Progress Notes (Signed)
CT surgery p.m. Rounds  Patient had good day, ambulated with physical therapy to the doorway Swallow test adequate for dysphagia II diet with nectar thick liquids, will remove Panda Persistent bloody drainage from left pleural and pocket drain, continuing low-dose Coumadin.   P.m. labs show PCO2 68 after being off BiPAP 12 hours Repeat hemoglobin 9.1, stable. Continue daily at bedtime BiPAP Plan on ambulation tomorrow with battery pack

## 2014-10-12 NOTE — Progress Notes (Signed)
Pt taken off BIPAP and placed on 4 LPM nasal cannula by RN. Vitals stable.

## 2014-10-13 ENCOUNTER — Inpatient Hospital Stay (HOSPITAL_COMMUNITY): Payer: Medicare Other

## 2014-10-13 LAB — POCT I-STAT, CHEM 8
BUN: 16 mg/dL (ref 6–23)
Calcium, Ion: 1.24 mmol/L (ref 1.13–1.30)
Chloride: 93 mmol/L — ABNORMAL LOW (ref 96–112)
Creatinine, Ser: 0.9 mg/dL (ref 0.50–1.35)
Glucose, Bld: 101 mg/dL — ABNORMAL HIGH (ref 70–99)
HEMATOCRIT: 31 % — AB (ref 39.0–52.0)
HEMOGLOBIN: 10.5 g/dL — AB (ref 13.0–17.0)
Potassium: 3.6 mmol/L (ref 3.5–5.1)
SODIUM: 139 mmol/L (ref 135–145)
TCO2: 34 mmol/L (ref 0–100)

## 2014-10-13 LAB — PROTIME-INR
INR: 1.41 (ref 0.00–1.49)
PROTHROMBIN TIME: 17.4 s — AB (ref 11.6–15.2)

## 2014-10-13 LAB — CBC
HCT: 22.1 % — ABNORMAL LOW (ref 39.0–52.0)
HEMATOCRIT: 29.9 % — AB (ref 39.0–52.0)
Hemoglobin: 6.6 g/dL — CL (ref 13.0–17.0)
Hemoglobin: 8.9 g/dL — ABNORMAL LOW (ref 13.0–17.0)
MCH: 29.6 pg (ref 26.0–34.0)
MCH: 29.2 pg (ref 26.0–34.0)
MCHC: 29.9 g/dL — ABNORMAL LOW (ref 30.0–36.0)
MCHC: 29.8 g/dL — AB (ref 30.0–36.0)
MCV: 99.1 fL (ref 78.0–100.0)
MCV: 98 fL (ref 78.0–100.0)
PLATELETS: 482 10*3/uL — AB (ref 150–400)
Platelets: 343 10*3/uL (ref 150–400)
RBC: 2.23 MIL/uL — ABNORMAL LOW (ref 4.22–5.81)
RBC: 3.05 MIL/uL — ABNORMAL LOW (ref 4.22–5.81)
RDW: 17.5 % — ABNORMAL HIGH (ref 11.5–15.5)
RDW: 17.2 % — ABNORMAL HIGH (ref 11.5–15.5)
WBC: 10.3 10*3/uL (ref 4.0–10.5)
WBC: 12.7 10*3/uL — AB (ref 4.0–10.5)

## 2014-10-13 LAB — GLUCOSE, CAPILLARY
GLUCOSE-CAPILLARY: 114 mg/dL — AB (ref 70–99)
Glucose-Capillary: 116 mg/dL — ABNORMAL HIGH (ref 70–99)
Glucose-Capillary: 143 mg/dL — ABNORMAL HIGH (ref 70–99)
Glucose-Capillary: 172 mg/dL — ABNORMAL HIGH (ref 70–99)
Glucose-Capillary: 55 mg/dL — ABNORMAL LOW (ref 70–99)
Glucose-Capillary: 77 mg/dL (ref 70–99)
Glucose-Capillary: 94 mg/dL (ref 70–99)

## 2014-10-13 LAB — CARBOXYHEMOGLOBIN
Carboxyhemoglobin: 1.8 % — ABNORMAL HIGH (ref 0.5–1.5)
METHEMOGLOBIN: 0.9 % (ref 0.0–1.5)
O2 Saturation: 86.9 %
Total hemoglobin: 8.7 g/dL — ABNORMAL LOW (ref 13.5–18.0)

## 2014-10-13 LAB — COMPREHENSIVE METABOLIC PANEL
ALT: 24 U/L (ref 0–53)
AST: 30 U/L (ref 0–37)
Albumin: 1.9 g/dL — ABNORMAL LOW (ref 3.5–5.2)
Alkaline Phosphatase: 254 U/L — ABNORMAL HIGH (ref 39–117)
Anion gap: 8 (ref 5–15)
BUN: 15 mg/dL (ref 6–23)
CO2: 36 mmol/L — ABNORMAL HIGH (ref 19–32)
Calcium: 8.3 mg/dL — ABNORMAL LOW (ref 8.4–10.5)
Chloride: 94 mmol/L — ABNORMAL LOW (ref 96–112)
Creatinine, Ser: 0.85 mg/dL (ref 0.50–1.35)
GFR calc Af Amer: >90 mL/min (ref >90)
GFR calc non Af Amer: 89 mL/min — ABNORMAL LOW (ref >90)
Glucose, Bld: 87 mg/dL (ref 70–99)
Potassium: 4 mmol/L (ref 3.5–5.1)
Sodium: 138 mmol/L (ref 135–145)
Total Bilirubin: 1.5 mg/dL — ABNORMAL HIGH (ref 0.3–1.2)
Total Protein: 5.7 g/dL — ABNORMAL LOW (ref 6.0–8.3)

## 2014-10-13 LAB — LACTATE DEHYDROGENASE: LDH: 332 U/L — ABNORMAL HIGH (ref 94–250)

## 2014-10-13 MED ORDER — DEXTROSE 50 % IV SOLN
25.0000 mL | Freq: Once | INTRAVENOUS | Status: AC
  Administered 2014-10-13: 25 mL via INTRAVENOUS

## 2014-10-13 MED ORDER — DIPHENHYDRAMINE HCL 50 MG/ML IJ SOLN
12.5000 mg | Freq: Once | INTRAMUSCULAR | Status: AC
  Administered 2014-10-13: 12.5 mg via INTRAVENOUS
  Filled 2014-10-13: qty 1

## 2014-10-13 MED ORDER — WARFARIN SODIUM 2.5 MG PO TABS
2.5000 mg | ORAL_TABLET | Freq: Every day | ORAL | Status: DC
  Administered 2014-10-13 – 2014-10-15 (×3): 2.5 mg via ORAL
  Filled 2014-10-13 (×4): qty 1

## 2014-10-13 MED ORDER — POTASSIUM CHLORIDE 10 MEQ/50ML IV SOLN
10.0000 meq | INTRAVENOUS | Status: AC
  Administered 2014-10-13 (×2): 10 meq via INTRAVENOUS
  Filled 2014-10-13: qty 50

## 2014-10-13 MED ORDER — FUROSEMIDE 40 MG PO TABS
40.0000 mg | ORAL_TABLET | Freq: Every day | ORAL | Status: DC
  Administered 2014-10-16 – 2014-10-22 (×6): 40 mg via ORAL
  Filled 2014-10-13 (×10): qty 1

## 2014-10-13 MED ORDER — DEXTROSE 50 % IV SOLN
INTRAVENOUS | Status: AC
  Filled 2014-10-13: qty 50

## 2014-10-13 MED ORDER — WARFARIN SODIUM 3 MG PO TABS
3.0000 mg | ORAL_TABLET | Freq: Every day | ORAL | Status: DC
  Filled 2014-10-13: qty 1

## 2014-10-13 MED ORDER — POTASSIUM CHLORIDE 10 MEQ/50ML IV SOLN
10.0000 meq | INTRAVENOUS | Status: AC
Start: 2014-10-13 — End: 2014-10-13
  Administered 2014-10-13 (×3): 10 meq via INTRAVENOUS
  Filled 2014-10-13: qty 50

## 2014-10-13 NOTE — Progress Notes (Signed)
Exit Care Note:       VAD dressing removed; one suture remains intact, distal incision has dehisced with recent third spacing; I was planning on removing the remaining proximal suture today since he has POD 22, however with noted changes I opted to leave it to prevent further dehiscence. No tissue growth over suture and it is freely mobile without pain to patient. Steri strip applied under sterile conditions to distal site to assist with re-approximation of wound margin. No drainage noted on the old dressing.  Exit site with decreasing erythema, no drainage and large circumferential crust at insertion site around drive line; not adhered to driveline. The velour is fully implanted at exit site. Dressing changed and site care performed using sterile technique. Using Chlora prep applicators x 2, allowed site to dry, and gauze dressing with aquacel strip applied. Drive line anchor in place. Driveline dressing is being changed daily per sterile technique.   Plan/Recommendations and Discharge Needs as discussed with VAD team:   1. Restore to blistered/torn areas that are exposed to frequent tape removals. Restore also applied under anchor.   2. Continue to perc lead dressing daily allowing his wife to participate.   3. D/W Dr. [name] and will leave distal suture another week.

## 2014-10-13 NOTE — Progress Notes (Signed)
Hypoglycemic Event  CBG: 55  Treatment: D50 IV 25 mL  Symptoms: None  Follow-up CBG: [id number] CBG Result:94  Possible Reasons for Event: Inadequate meal intake  Comments/MD notified:n/a hypoglycemia protocol used    Wyline Beady R  Remember to initiate Hypoglycemia Order Set & complete

## 2014-10-13 NOTE — Progress Notes (Signed)
HeartMate 2 Rounding Note  Subjective:   POD #22  VAD implant  (DT)  For acute /chronic systolic HF from ischemic cardiomyopathy and cardiogenic shock on preop IABP  POD#8 re-exploration for bleeding - coagulopathy/ oozing in pump pocket- all pump anastomoses dry  Prior hx PE- with large R pulmonary infarct,necrotic    lung/pneumonia- poss empyema on IV Zosyn- brown sputum Restrictive lung disease- FEV1 1.3 from ankylosing spondylitis and CO2 retention DM  HX ventricular, atrial arrhythmia Postoperative CO2 retention requiring BiPAP  Every HS Postoperative coagulopathy associated with INR 2.7  Patient ambulated 75 feet with physical therapy today, getting stronger   330 cc chest tube output in last  24 hr - leave 2 chest tubes Stable hemodynamics - low dose milrinone  Has been weaned off NSR off iv amiodarone 2D echo  with good RV fx, LV decompressed by VAD  Patient extubated 4-10- PCO2 up due to restrictive thoracic deformity- reduced compliance Placed on  BIPAP over night-  cough today more dry Diuresing well - lasix  changed to daily oral dose Panda tube removed after swallowing evaluation recommended dysphagia diet with nectar thick liquids Low dose coumadin started -INR goal 1.5 - 2.0.  Anticoagulation difficult situation with hx of DVT and large pulmonary infarct but with severe bleeding into surgical pump pocket with INR 2.7 which required 2 re- explorations to stop Hb Remained stable at 9.1  ,CXR and drains w/o evidence of rebleeding- persistent drainage from pocket tube and left pleural tube greater than 300 cc per day so we'll leave drains in place  Elevated WBC  cont IV Fortaz and vancomycin- cultures all neg- suspect pulmonary source- will mobilize OOB today. WBC better - 14k  HeartMate II LVAD:  Flow 4.5 liters/min, speed 9400 rpm power 6.1, PI  5.0 , no PI events in 24 hrs  Controller intact-   Objective:    Vital Signs:   Temp:  [97.4 F (36.3 C)-97.6 F (36.4 C)]  97.4 F (36.3 C) (04/13 1100) Pulse Rate:  [59-119] 81 (04/13 1600) Resp:  [14-39] 37 (04/13 1600) SpO2:  [95 %-100 %] 98 % (04/13 1600) FiO2 (%):  [4 %-40 %] 4 % (04/13 0800) Weight:  [188 lb 11.4 oz (85.6 kg)] 188 lb 11.4 oz (85.6 kg) (04/13 0500) Last BM Date: 10/12/14 Mean arterial Pressure 78  Intake/Output:   Intake/Output Summary (Last 24 hours) at 10/13/14 1802 Last data filed at 10/13/14 1730  Gross per 24 hour  Intake 1103.2 ml  Output   3636 ml  Net -2532.8 ml     Physical Exam: General:extubated , alert- sitting up in chair HEENT: normal Neck: supple.  noJVP   no bruits. No lymphadenopathy or thryomegaly appreciated. Cor: Mechanical heart sounds with LVAD hum present. Lungs: bibasilar scattered rhonchi Abdomen: soft, nontender, nondistended. No hepatosplenomegaly. No bruits or masses. Good bowel sounds. Driveline: C/D/I; securement device intact and driveline incorporated Extremities: warm well perfused,no cyanosis, clubbing, rash, edema- Neuro: responsive without focal weakness Sternal incision dry   Telemetry:nsr  Labs: Basic Metabolic Panel:  Recent Labs Lab 10/07/14 0409  10/08/14 0400  10/09/14 0625 10/09/14 1600 10/10/14 0400 10/10/14 0749  10/11/14 0503 10/11/14 1637 10/12/14 0420 10/12/14 2227 10/13/14 0340 10/13/14 1615  NA 138  < > 136  < >  --  134*  --  134*  < > 136 136 138 122* 138 139  K 4.1  < > 3.6  < >  --  4.2  --  3.3*  < >  3.4* 3.6 3.3* 3.0* 4.0 3.6  CL 104  < > 101  < >  --  95*  --  92*  < > 94* 91* 94* 91* 94* 93*  CO2 23  --  31  --   --  32  --  36*  --  43*  --  38*  --  36*  --   GLUCOSE 132*  < > 141*  < >  --  123*  --  191*  < > 90 120* 127* 556* 87 101*  BUN 21  < > 16  < >  --  14  --  12  < > 8 13 14 14 15 16   CREATININE 0.89  < > 0.62  < >  --  0.51  --  0.59  < > 0.58 0.80 0.81 0.90 0.85 0.90  CALCIUM 8.0*  --  7.7*  --   --  7.4*  --  7.3*  --  7.6*  --  8.0*  --  8.3*  --   MG 1.3*  --  1.8  --  1.6  --  1.6   --   --  1.8  --   --   --   --   --   < > = values in this interval not displayed.  Liver Function Tests:  Recent Labs Lab 10/08/14 0400 10/10/14 0749 10/11/14 0503 10/12/14 0420 10/13/14 0340  AST 26 28 29  37 30  ALT 18 19 21 23 24   ALKPHOS 92 207* 196* 244* 254*  BILITOT 1.4* 1.2 1.5* 1.3* 1.5*  PROT 4.3* 4.8* 5.0* 4.9* 5.7*  ALBUMIN 1.9* 1.7* 1.9* 1.8* 1.9*   No results for input(s): LIPASE, AMYLASE in the last 168 hours. No results for input(s): AMMONIA in the last 168 hours.  CBC:  Recent Labs Lab 10/07/14 0409  10/11/14 0503  10/12/14 0420 10/12/14 1400 10/12/14 2227 10/13/14 0805 10/13/14 0840 10/13/14 1615  WBC 26.0*  < > 18.3*  --  14.6* 14.9*  --  10.3 12.7*  --   NEUTROABS 22.6*  --   --   --   --   --   --   --   --   --   HGB 8.5*  < > 9.1*  < > 8.4* 9.1* 8.5* 6.6* 8.9* 10.5*  HCT 24.0*  < > 28.5*  < > 27.5* 29.9* 25.0* 22.1* 29.9* 31.0*  MCV 85.1  < > 94.4  --  97.5 97.4  --  99.1 98.0  --   PLT 198  < > 252  --  397 446*  --  343 482*  --   < > = values in this interval not displayed.  INR:  Recent Labs Lab 10/09/14 0625 10/10/14 0400 10/11/14 0503 10/12/14 0420 10/13/14 0340  INR 1.24 1.24 1.21 1.37 1.41    Other results:  EKG:   Imaging: Dg Chest Port 1 View  10/13/2014   CLINICAL DATA:  Post LVAD placement.  EXAM: PORTABLE CHEST - 1 VIEW  COMPARISON:  10/12/2014  FINDINGS: Left basilar chest tube remains. No pneumothorax is identified. There is some residual pleural thickening versus effusion on the left. Visualize lateral aspect of the LVAD is stable in appearance. There is stable atelectasis/consolidation at the lateral right lung base. Right PICC line positioning is stable. Cardiomegaly is without change. No overt pulmonary edema.  IMPRESSION: No pneumothorax. Residual pleural thickening versus effusion on the left appears stable. Stable right lower lung  atelectasis/consolidation.   Electronically Signed   By: Aletta Edouard M.D.    On: 10/13/2014 08:16   Dg Chest Port 1 View  10/12/2014   CLINICAL DATA:  69 year old male with left ventricular assist device. Initial encounter.  EXAM: PORTABLE CHEST - 1 VIEW  COMPARISON:  For [place] 2016 and earlier.  FINDINGS: Portable AP upright view at 0655 hours. Swan-Ganz catheter removed. Right IJ introducer sheath no longer visible. Stable visualized enteric feeding tube. Stable right side PICC line. Left chest tube and LVAD partially re- identified.  Small to moderate left pleural effusion most apparent in the upper chest. No pneumothorax identified. Stable patchy basilar opacity, greater on the right. Stable cardiac size and mediastinal contours. No overt pulmonary edema.  IMPRESSION: 1. Swan-Ganz catheter removed.  Otherwise, stable lines and tubes. 2. No pneumothorax identified. Small to moderate left pleural effusion. Stable patchy basilar opacity.   Electronically Signed   By: Genevie Ann M.D.   On: 10/12/2014 08:11     Medications:     Scheduled Medications: . acetaminophen (TYLENOL) oral liquid 160 mg/5 mL  650 mg Per Tube Once   Or  . acetaminophen  650 mg Rectal Once  . antiseptic oral rinse  7 mL Mouth Rinse BID  . budesonide-formoterol  2 puff Inhalation BID  . citalopram  20 mg Oral Daily  . dextromethorphan-guaiFENesin  1 tablet Oral BID  . [START ON 10/15/2014] furosemide  40 mg Oral Daily  . insulin aspart  0-24 Units Subcutaneous 6 times per day  . pantoprazole  40 mg Oral QHS  . piperacillin-tazobactam (ZOSYN)  IV  3.375 g Intravenous 3 times per day  . potassium chloride  10 mEq Intravenous Q1 Hr x 3  . sodium chloride  10 mL Intravenous Q12H  . vancomycin  1,000 mg Intravenous Q12H  . warfarin  2.5 mg Oral q1800  . Warfarin - Physician Dosing Inpatient   Does not apply q1800    Infusions: . sodium chloride 20 mL/hr at 10/11/14 1400  . lactated ringers 20 mL/hr at 10/11/14 2000  . norepinephrine (LEVOPHED) Adult infusion Stopped (10/11/14 0100)    PRN  Medications: sodium chloride, oxyCODONE **AND** acetaminophen, fentaNYL, levalbuterol, ondansetron (ZOFRAN) IV, RESOURCE THICKENUP CLEAR, sodium chloride, traMADol   Assessment:  Stable hemodynamics- , VAD speed  now 9400  CO-OX >70%  Successfully extubated but needs HS BIPAP CO2 elevated from restrictive lung dz - bicarb lower after a dose of Diamox Cont IV Zosyn, vanco  for possible pneumonia, empyema from past pulmonary infarct Coumadin dose gradually increased for INR 1.5-2.0-2.5 mg daily Plan/Discussion:   Mobilize  OOB with PT Remove drains when total  drainage less than 100 cc per day Swallow study, continue dysphagia diet Resume anticoagulation slowly- inr target 1.5-2.0 for now,add asa 81 mg after 3wks to allow pump pocket space to obliterate and reassess INR goal at that time Patients condition updated and plan of care discussed daily with wife    I reviewed the LVAD parameters from today, and compared the results to the patient's prior recorded data.  No programming changes were made.  The LVAD is functioning within specified parameters.  The patient performs LVAD self-test daily.  LVAD interrogation was negative for any significant power changes, alarms or PI events/speed drops.  LVAD equipment check completed and is in good working order.  Back-up equipment present.   LVAD education done on emergency procedures and precautions and reviewed exit site care.  Length of Stay: 64  Tharon Aquas Trigt III 10/13/2014, 6:02 PM

## 2014-10-13 NOTE — Progress Notes (Signed)
Patient ID: Scott [name], male   DOB: Mar 23, 1946, 69 y.o.   MRN: [id number] SICU Evening Rounds:  Hemodynamics stable  CVP 3.  PI 6 Flow 5.3  Urine output good  Chest tube output low.  CBC    Component Value Date/Time   WBC 12.7* 10/13/2014 0840   RBC 3.05* 10/13/2014 0840   HGB 10.5* 10/13/2014 1615   HCT 31.0* 10/13/2014 1615   PLT 482* 10/13/2014 0840   MCV 98.0 10/13/2014 0840   MCH 29.2 10/13/2014 0840   MCHC 29.8* 10/13/2014 0840   RDW 17.2* 10/13/2014 0840   LYMPHSABS 0.8 10/07/2014 0409   MONOABS 2.6* 10/07/2014 0409   EOSABS 0.0 10/07/2014 0409   BASOSABS 0.0 10/07/2014 0409    BMET    Component Value Date/Time   NA 139 10/13/2014 1615   K 3.6 10/13/2014 1615   CL 93* 10/13/2014 1615   CO2 36* 10/13/2014 0340   GLUCOSE 101* 10/13/2014 1615   BUN 16 10/13/2014 1615   CREATININE 0.90 10/13/2014 1615   CALCIUM 8.3* 10/13/2014 0340   GFRNONAA 89* 10/13/2014 0340   GFRAA >90 10/13/2014 0340    Ambulated 75 ft today. Taking some po

## 2014-10-13 NOTE — Progress Notes (Signed)
CSW met with patient and wife at bedside. Patient was sitting up in chair next to bed and reports he was able to take a short walk on the unit. Patient appeared pleased with his progress although hoping to feel the progress soon. Patient's wife was in good spirits with obvious improvement of patient. Both spent time with CSW talking about their love of cars. CSW will continue to follow for support and shared the upcoming support group with both. Raquel Sarna, [place]

## 2014-10-13 NOTE — Progress Notes (Signed)
Inpatient Diabetes Program Recommendations  AACE/ADA: New Consensus Statement on Inpatient Glycemic Control (2013)  Target Ranges:  Prepandial:   less than 140 mg/dL      Peak postprandial:   less than 180 mg/dL (1-2 hours)      Critically ill patients:  140 - 180 mg/dL   Reason for Assessment:   Results for PAVLOS, YON (MRN [id number]) as of 10/13/2014 10:00  Ref. Range 10/12/2014 20:22 10/12/2014 23:59 10/13/2014 04:53 10/13/2014 05:24 10/13/2014 08:05  Glucose-Capillary Latest Ref Range: 70-99 mg/dL 122 (H) 172 (H) 55 (L) 94 77   Please change Novolog to moderate tid with meals and HS.  Thanks,  Adah Perl, RN, BC-ADM Inpatient Diabetes Coordinator Pager [phone] (8a-5p)

## 2014-10-13 NOTE — Progress Notes (Signed)
Patient ID: Scott [name], male   DOB: Apr 04, 1946, 69 y.o.   MRN: [id number] HeartMate 2 Rounding Note  Subjective:    S/P HMII LVAD on 09/20/2013.   Moved to ICU  4/5 due to low MAP and low hemoglobin. Taken Back to the OR 4/5 and 4/6  for recurrent bleed and evacuation of clots from left chest.   On 4 liters [place].  Off milrinone. CO-OX today 86%. Hgb this am 6.6 but repeat 8.9 Still draining from pocket drain and left pleural CT. Weight up 2 pounds.   Ambulated out of the room with PT. Complains of fatigue.    LVAD INTERROGATION:  HeartMate II LVAD:  Flow 5.6  liters/min, speed 9400, power 5.6 , PI 5.6, Rare PI   Objective:    Vital Signs:   Temp:  [97.4 F (36.3 C)-97.8 F (36.6 C)] 97.5 F (36.4 C) (04/13 0804) Pulse Rate:  [59-119] 119 (04/13 0900) Resp:  [14-39] 18 (04/13 0900) SpO2:  [95 %-100 %] 95 % (04/13 0900) FiO2 (%):  [4 %-40 %] 4 % (04/13 0800) Weight:  [188 lb 11.4 oz (85.6 kg)] 188 lb 11.4 oz (85.6 kg) (04/13 0500) Last BM Date: 10/12/14 Mean arterial Pressure  70-80s  Intake/Output:   Intake/Output Summary (Last 24 hours) at 10/13/14 1025 Last data filed at 10/13/14 0900  Gross per 24 hour  Intake  968.8 ml  Output   2650 ml  Net -1681.2 ml     Physical Exam: CVP 3 General: Sitting in chair  HEENT: normal Neck: supple. Carotids 2+ bilat; no bruits. No lymphadenopathy or thryomegaly appreciated. R neck Swan  Cor: Mechanical heart sounds with LVAD hum present. Lungs: RML RLL LLL crackles. On 4 liters [place].  Abdomen: soft, nontender, nondistended. No hepatosplenomegaly. No bruits or masses. Good bowel sounds. Driveline: Dressing looks good.  Extremities: no cyanosis, clubbing, rash.  Trace edema Neuro: Awake alert GU: foley   Telemetry: NSR 70-80s   Labs: Basic Metabolic Panel:  Recent Labs Lab 10/07/14 0409  10/08/14 0400  10/09/14 0625 10/09/14 1600 10/10/14 0400 10/10/14 0749  10/11/14 0503 10/11/14 1637 10/12/14 0420  10/12/14 2227 10/13/14 0340  NA 138  < > 136  < >  --  134*  --  134*  < > 136 136 138 122* 138  K 4.1  < > 3.6  < >  --  4.2  --  3.3*  < > 3.4* 3.6 3.3* 3.0* 4.0  CL 104  < > 101  < >  --  95*  --  92*  < > 94* 91* 94* 91* 94*  CO2 23  --  31  --   --  32  --  36*  --  43*  --  38*  --  36*  GLUCOSE 132*  < > 141*  < >  --  123*  --  191*  < > 90 120* 127* 556* 87  BUN 21  < > 16  < >  --  14  --  12  < > 8 13 14 14 15   CREATININE 0.89  < > 0.62  < >  --  0.51  --  0.59  < > 0.58 0.80 0.81 0.90 0.85  CALCIUM 8.0*  --  7.7*  --   --  7.4*  --  7.3*  --  7.6*  --  8.0*  --  8.3*  MG 1.3*  --  1.8  --  1.6  --  1.6  --   --  1.8  --   --   --   --   < > = values in this interval not displayed.  Liver Function Tests:  Recent Labs Lab 10/08/14 0400 10/10/14 0749 10/11/14 0503 10/12/14 0420 10/13/14 0340  AST 26 28 29  37 30  ALT 18 19 21 23 24   ALKPHOS 92 207* 196* 244* 254*  BILITOT 1.4* 1.2 1.5* 1.3* 1.5*  PROT 4.3* 4.8* 5.0* 4.9* 5.7*  ALBUMIN 1.9* 1.7* 1.9* 1.8* 1.9*   No results for input(s): LIPASE, AMYLASE in the last 168 hours. No results for input(s): AMMONIA in the last 168 hours.  CBC:  Recent Labs Lab 10/07/14 0409  10/11/14 0503  10/12/14 0420 10/12/14 1400 10/12/14 2227 10/13/14 0805 10/13/14 0840  WBC 26.0*  < > 18.3*  --  14.6* 14.9*  --  10.3 12.7*  NEUTROABS 22.6*  --   --   --   --   --   --   --   --   HGB 8.5*  < > 9.1*  < > 8.4* 9.1* 8.5* 6.6* 8.9*  HCT 24.0*  < > 28.5*  < > 27.5* 29.9* 25.0* 22.1* 29.9*  MCV 85.1  < > 94.4  --  97.5 97.4  --  99.1 98.0  PLT 198  < > 252  --  397 446*  --  343 482*  < > = values in this interval not displayed.  INR:  Recent Labs Lab 10/09/14 0625 10/10/14 0400 10/11/14 0503 10/12/14 0420 10/13/14 0340  INR 1.24 1.24 1.21 1.37 1.41    Other results:    Imaging: Dg Chest Port 1 View  10/13/2014   CLINICAL DATA:  Post LVAD placement.  EXAM: PORTABLE CHEST - 1 VIEW  COMPARISON:  10/12/2014  FINDINGS:  Left basilar chest tube remains. No pneumothorax is identified. There is some residual pleural thickening versus effusion on the left. Visualize lateral aspect of the LVAD is stable in appearance. There is stable atelectasis/consolidation at the lateral right lung base. Right PICC line positioning is stable. Cardiomegaly is without change. No overt pulmonary edema.  IMPRESSION: No pneumothorax. Residual pleural thickening versus effusion on the left appears stable. Stable right lower lung atelectasis/consolidation.   Electronically Signed   By: Aletta Edouard M.D.   On: 10/13/2014 08:16   Dg Chest Port 1 View  10/12/2014   CLINICAL DATA:  69 year old male with left ventricular assist device. Initial encounter.  EXAM: PORTABLE CHEST - 1 VIEW  COMPARISON:  For [place] 2016 and earlier.  FINDINGS: Portable AP upright view at 0655 hours. Swan-Ganz catheter removed. Right IJ introducer sheath no longer visible. Stable visualized enteric feeding tube. Stable right side PICC line. Left chest tube and LVAD partially re- identified.  Small to moderate left pleural effusion most apparent in the upper chest. No pneumothorax identified. Stable patchy basilar opacity, greater on the right. Stable cardiac size and mediastinal contours. No overt pulmonary edema.  IMPRESSION: 1. Swan-Ganz catheter removed.  Otherwise, stable lines and tubes. 2. No pneumothorax identified. Small to moderate left pleural effusion. Stable patchy basilar opacity.   Electronically Signed   By: Genevie Ann M.D.   On: 10/12/2014 08:11     Medications:     Scheduled Medications: . acetaminophen (TYLENOL) oral liquid 160 mg/5 mL  650 mg Per Tube Once   Or  . acetaminophen  650 mg Rectal Once  . antiseptic oral rinse  7 mL Mouth Rinse  BID  . budesonide-formoterol  2 puff Inhalation BID  . citalopram  20 mg Oral Daily  . dextromethorphan-guaiFENesin  1 tablet Oral BID  . furosemide  40 mg Intravenous Daily  . insulin aspart  0-24 Units  Subcutaneous 6 times per day  . insulin glargine  8 Units Subcutaneous BID  . metoCLOPramide (REGLAN) injection  10 mg Intravenous 4 times per day  . pantoprazole  40 mg Oral QHS  . piperacillin-tazobactam (ZOSYN)  IV  3.375 g Intravenous 3 times per day  . sodium chloride  10 mL Intravenous Q12H  . vancomycin  1,000 mg Intravenous Q12H  . warfarin  3 mg Oral q1800  . Warfarin - Physician Dosing Inpatient   Does not apply q1800    Infusions: . sodium chloride 20 mL/hr at 10/11/14 1400  . lactated ringers 20 mL/hr at 10/11/14 2000  . norepinephrine (LEVOPHED) Adult infusion Stopped (10/11/14 0100)    PRN Medications: sodium chloride, oxyCODONE **AND** acetaminophen, fentaNYL, levalbuterol, ondansetron (ZOFRAN) IV, RESOURCE THICKENUP CLEAR, sodium chloride, traMADol   Assessment:    1.  A/C systolic HF class IV with cardiogenic shock- EF 15% with moderate RV dysfunction 3/16 echo.         -S/P HMII LVAD 09/21/14  2. CAD s/p Anterior STEMI on 06/12/14 with stenting of LAD- was on coumadin + plavix, statin.  Now holding coumadin, off Plavix.  3. Acute on chronic hypercarbic respiratory failure  4. 06/12/14 VT in setting of STEMI 5. Ankylosing spondylitis. 6. Bilateral PE with R DVT- S/P IVC filter 08/27/2014--->coumadin 7. Acute Blood Loss into pleural space:  Back to OR 4/5 and 4/6 for recurrent bleed. Received multiple blood products.  8. PAF: On amiodarone.  Plan/Discussion:   S/P 09/21/2014 HMII LVAD.   Back to OR 4/5 and and again 4/6 for recurrent bleed and evacuation clots. Received multiple blood products. Hemoglobin  Today 6.6 but repeat 8.9. Per Dr Darcey Nora if < 8 will transfuse.   Off vent. On Bipap. PCO2 in upper normal range for him. Continue nightly bipap.  On 4 liters [place].   CO-OX 86% off milrinone. CVP down to 3. Stop IV lasix. Would start lasix 40 mg po daily on Friday.  Weight up 2 pounds.   H/o VT and atrial fibrillation, no further atrial fibrillation.  On  amiodarone gtt.  Supp K and magnesium.   On coumadin. INR 1.41. Goal INR 1.5-2.   Will not restart Plavix as anterior wall completely necrotic.   LVAD parameters stable.    I reviewed the LVAD parameters from today, and compared the results to the patient's prior recorded data.  No programming changes were made.  The LVAD is functioning within specified parameters.  The patient performs LVAD self-test daily.  LVAD interrogation was negative for any significant power changes, alarms or PI events/speed drops.  LVAD equipment check completed and is in good working order.  Back-up equipment present.   LVAD education done on emergency procedures and precautions and reviewed exit site care.   Length of Stay: 7  CLEGG,AMY  NP-C  10/13/2014, 10:25 AM  VAD Team --- VAD ISSUES ONLY--- Pager [phone] (7am - 7am)  Advanced Heart Failure Team  Pager [phone] (M-F; 7a - 4p)  Please contact [facility] for night-coverage after hours (4p -7a ) and weekends on [email or web address]  Patient seen and examined with Darrick Grinder, NP. We discussed all aspects of the encounter. I agree with the assessment and plan as stated above.   Improving  slowly. CVP down so will stop IV lasix. Hgb stable. VAD parameters stable. Continue with coumadin load. Management of chest tubes per TCTS. Continue acetazolamide to help with respiratory alkalosis.   Benay Spice 5:58 PM

## 2014-10-13 NOTE — Progress Notes (Signed)
Physical Therapy Treatment Patient Details Name: Scott [name] MRN: [id number] DOB: [birth year] Today's Date: 10/13/2014    History of Present Illness 69 y.o. M with extensive cardiac hx as well as recent PE / DVT, admitted for SOB due to heart failure, cardiogenic, shock, PE. Evaluated by VAD team. Found to have mod - large R pleural effusion. LVAD implantation 09/22/14. Progressed well until developed hemothorax and required return to OR 4/5 and 4/6 for Redo sternotomy, reexploration of chest and removal of hematoma.    PT Comments    Great progress today. Pt now using Rt hand to switch his LVAD on/off battery power. Able to ambulate 70 ft with PI stable. Lt foot drop persists, however he was able to clear foot without stumbling.  Follow Up Recommendations  Home health PT;Supervision/Assistance - 24 hour     Equipment Recommendations   (2 wheeled RW with fold down seat)    Recommendations for Other Services       Precautions / Restrictions Precautions Precautions: Sternal;Fall Precaution Comments: LVAD Restrictions Weight Bearing Restrictions:  (sternal precautions)    Mobility  Bed Mobility                  Transfers Overall transfer level: Needs assistance Equipment used: Rolling walker (2 wheeled);None Transfers: Sit to/from [facility] Sit to Stand: +2 physical assistance;+2 safety/equipment;Mod assist Stand pivot transfers: Mod assist;+2 physical assistance;+2 safety/equipment       General transfer comment: Pivoted recliner to [facility] and back prior to ambulation; PI dropped <2.0 and then switched to batteries, portable O2, etc  Ambulation/Gait Ambulation/Gait assistance: Min assist (+3 for equipment/safety (lines and chair follow)) Ambulation Distance (Feet): 70 Feet Assistive device: Rolling walker (2 wheeled) Gait Pattern/deviations: Step-through pattern;Decreased stride length;Trunk flexed     General Gait Details: tolerated  well; RW with seat which reduces his stride length; overall tolerated very well   Stairs            Wheelchair Mobility    Modified Rankin (Stroke Patients Only)       Balance     Sitting balance-Leahy Scale: Fair       Standing balance-Leahy Scale: Poor                      Cognition Arousal/Alertness: Awake/alert Behavior During Therapy: Flat affect Overall Cognitive Status: Within Functional Limits for tasks assessed                      Exercises      General Comments General comments (skin integrity, edema, etc.): PI to/from BSC 6.6 to 4.7; with walking 6.6 to 6.2; pt able to switch himself to batteries with set-up      Pertinent Vitals/Pain Pain Assessment: Faces Faces Pain Scale: Hurts even more Pain Location: chest Pain Intervention(s): Limited activity within patient's tolerance;Monitored during session;Repositioned    Home Living                      Prior Function            PT Goals (current goals can now be found in the care plan section) Acute Rehab PT Goals Patient Stated Goal: to go home  Time For Goal Achievement: 10/25/14 Progress towards PT goals: Progressing toward goals    Frequency  Min 3X/week    PT Plan Current plan remains appropriate    Co-evaluation  End of Session Equipment Utilized During Treatment: Oxygen Activity Tolerance: Patient limited by fatigue Patient left: in chair;with call bell/phone within reach;with family/visitor present;with nursing/sitter in room     Time: 0122-2411 PT Time Calculation (min) (ACUTE ONLY): 41 min  Charges:  $Gait Training: 8-22 mins $Therapeutic Activity: 23-37 mins                    G Codes:      Scott [name] 11-05-14, 11:33 AM Pager [phone]

## 2014-10-13 NOTE — Progress Notes (Signed)
Admitted 09/10/14 with worsening heart failure and cardiogenic shock.  HeartMate II LVAD implated on 09/21/14 by Dr. Darcey Nora as DT VAD.   Vital signs: Temp: 97.4 - 97.5 deg F HR: 60 - 81 SR  Doppler MAP:  75 - 80 O2 Sat:  99 - 100% (4L [facility], .40 BiPAP 2230 - 0600)  Wt in lbs:  173 > ... 204 > 192 > 188 > 186 > 188 (was net -1400 yesterday)  LVAD interrogation reveals:  Speed:  9400 Flow: 5.1 Power: 5.7 PI: 6.3 Alarms: none Events: no PI since 4/10 1500 Fixed speed:  9400 Low speed limit: 8800  Blood products: OR:   09/21/14:  1 unit PC, 2 plts, 3 FFP 10/05/14:  3 PC's, 500 cell saver 10/06/14:  2 PCs, 2 cryo, 300 cell saver     ICU:  09/22/14:   1 unit PC 10/05/14:  2 PCs, 2 FFP 10/06/14:  5 PCs, 2 FFP, 1 Plt, 1 cryo, 3 Factor VII, DDAVP  Nitric Oxide: off since Friday 10/11/14  Extubated: 10/10/14 and tolerating BiPAP overnight   Current gtts: Off    ASA 81 mg:  One dose 10/11/14; holding for now with drop in Hgb  Warfarin: increasing doses slowly. INR goal 1.5 - 2.0  Labs:  LDH trend: 364 > 496 (peak) >... 286 > 312 > 301 > 322  INR trend: 1.78 >2.0>1.0>1.2 > 1.47 > 1.33 >1.24>1.24>1.24 > 1.37 > 1.4  Co-Ox:  62.3% >64>65 > 70.3 > 84 > 63 > 86%  WBC:  11.4 > 15.4 > 17.2 > ...  >27.5 > 23.0 >  20.1 > 18.3 > 14.6 > 12.7  Hgb: ... > 9.9> 8.4 > 8.5 > 8.9 > 9.1 > 8.4 > 8.9  Drive Line:  Dressing clean, dry and intact. His wife has resumed changing dressings over the weekend. Being changed daily. Will plan on changing with her today and plan on D/C'ing remaining suture if approximated (22 days now) since swelling down significantly.   Mr. Cruces is sitting up in recliner this morning. Ate all of his breakfast with exception of his oatmeal. Ambulated with CR to the door into hall yesterday and planning to walk again today. Ready to get cleaned up and get a shave. Mrs. Senn is asking for some more pamphlets explaining alarm management--will bring up to her today.    Plan/Recommendations and Discharge Needs as discussed with VAD team:  1.  330 cc output from remaining 2 CT's; will leave for now per Dr. Darcey Nora.  2. On 4 LPM [place] maintaining sats > 96%--could try weaning to 2 LPM since O2 was > 100 mmHg yesterday on PM ABG. Tolerating BiPAP .40 at night. Trend ABGs as determined by medical team.   4. WBC trending down slowly -- pan cultures negative (final). Pt on Vanc and Zosyn.    6.  Tolerating Dysphagia II diet well this morning. Panda out. Albumin low and will need to continue to push nutrition and supplementation as he will tolerate. Resource thickener at bedside.   7. No power elevations observed or sustained. Will continue to monitor with lower INR goal d/t bleeding risk. Hgb stable today. Holding ASA for now.  8. Resume PT/OT/Cardiac Rehab to help with strength and recovery. Follow for home health needs (re: CIR, Home PT, equipment, etc.)  9. Blood sugar was 55 requiring treatment with D50 this morning at 0500. PM lantus was held yesterday evening but is scheduled BID 10 units. Consider dose adjust vs.  D/C since off continuous TF.

## 2014-10-13 NOTE — Progress Notes (Signed)
Speech Language Pathology Treatment: Dysphagia  Patient Details Name: Scott [name] MRN: [id number] DOB: [birth year] Today's Date: 10/13/2014 Time: 1151-1205 SLP Time Calculation (min) (ACUTE ONLY): 14 min  Assessment / Plan / Recommendation Clinical Impression  Diagnostic PO trial administered to assess tolerance of current diet as PO diet initiated yesterday after FEES.  Instructed pt and family re: proper positioning for PO, safe swallow strategies and reviewed diet recommendations. Family and pt instructed on correct measurements for thickening liquids to nectar consistency.  Pt tolerated puree and nectar thick liquid trials via teaspoon without overt s/s of aspiration. Pt remains aphonic with weak, breathy cough and throat clear which indicates reduced ability to protect the airway. I recommend pt continue on current diet with close supervision.  Cup of fruit cocktail noted at bedside. Family member verbalized that she would drain juice and mash up peaches before given them to the pt. Pt and family verbalized swallow strategies with min A.     HPI HPI: 69 y.o. M with extensive cardiac hx as well as recent PE / DVT, admitted for SOB due to heart failure, cardiogenic, shock, PE. Evaluated by VAD team. Found to have mod - large R pleural effusion. LVAD implantation 09/22/14. Progressed well until developed hemothorax and required return to OR 4/5 and 4/6 for Redo sternotomy, reexploration of chest and removal of hematoma.   Pertinent Vitals Pain Assessment: No/denies pain Faces Pain Scale: Hurts even more Pain Location: chest Pain Intervention(s): Limited activity within patient's tolerance;Monitored during session;Repositioned  SLP Plan       Recommendations Diet recommendations: Dysphagia 2 (fine chop);Nectar-thick liquid Liquids provided via: Cup Medication Administration: Crushed with puree Supervision: Patient able to self feed;Intermittent supervision to cue for compensatory  strategies Postural Changes and/or Swallow Maneuvers: Seated upright 90 degrees;Upright 30-60 min after meal              Oral Care Recommendations: Oral care BID Follow up Recommendations: Inpatient Rehab    GO     Scott [name] Annye Rusk , MS CCC-SLP  10/13/2014, 12:20 PM

## 2014-10-14 ENCOUNTER — Inpatient Hospital Stay (HOSPITAL_COMMUNITY): Payer: Medicare Other

## 2014-10-14 DIAGNOSIS — Z95811 Presence of heart assist device: Secondary | ICD-10-CM

## 2014-10-14 DIAGNOSIS — I5023 Acute on chronic systolic (congestive) heart failure: Secondary | ICD-10-CM

## 2014-10-14 LAB — PROTIME-INR
INR: 1.51 — ABNORMAL HIGH (ref 0.00–1.49)
Prothrombin Time: 18.4 seconds — ABNORMAL HIGH (ref 11.6–15.2)

## 2014-10-14 LAB — CBC
HCT: 29.3 % — ABNORMAL LOW (ref 39.0–52.0)
Hemoglobin: 8.8 g/dL — ABNORMAL LOW (ref 13.0–17.0)
MCH: 29.5 pg (ref 26.0–34.0)
MCHC: 30 g/dL (ref 30.0–36.0)
MCV: 98.3 fL (ref 78.0–100.0)
Platelets: 502 10*3/uL — ABNORMAL HIGH (ref 150–400)
RBC: 2.98 MIL/uL — ABNORMAL LOW (ref 4.22–5.81)
RDW: 17.3 % — ABNORMAL HIGH (ref 11.5–15.5)
WBC: 11.7 10*3/uL — ABNORMAL HIGH (ref 4.0–10.5)

## 2014-10-14 LAB — CARBOXYHEMOGLOBIN
Carboxyhemoglobin: 2 % — ABNORMAL HIGH (ref 0.5–1.5)
Methemoglobin: 1.1 % (ref 0.0–1.5)
O2 Saturation: 87 %
TOTAL HEMOGLOBIN: 8.9 g/dL — AB (ref 13.5–18.0)

## 2014-10-14 LAB — GLUCOSE, CAPILLARY
GLUCOSE-CAPILLARY: 119 mg/dL — AB (ref 70–99)
Glucose-Capillary: 91 mg/dL (ref 70–99)
Glucose-Capillary: 126 mg/dL — ABNORMAL HIGH (ref 70–99)
Glucose-Capillary: 121 mg/dL — ABNORMAL HIGH (ref 70–99)
Glucose-Capillary: 109 mg/dL — ABNORMAL HIGH (ref 70–99)

## 2014-10-14 LAB — COMPREHENSIVE METABOLIC PANEL
ALT: 24 U/L (ref 0–53)
AST: 33 U/L (ref 0–37)
Albumin: 2 g/dL — ABNORMAL LOW (ref 3.5–5.2)
Alkaline Phosphatase: 275 U/L — ABNORMAL HIGH (ref 39–117)
Anion gap: 7 (ref 5–15)
BUN: 13 mg/dL (ref 6–23)
CO2: 38 mmol/L — ABNORMAL HIGH (ref 19–32)
Calcium: 8.6 mg/dL (ref 8.4–10.5)
Chloride: 97 mmol/L (ref 96–112)
Creatinine, Ser: 0.78 mg/dL (ref 0.50–1.35)
GFR calc Af Amer: >90 mL/min (ref >90)
GFR calc non Af Amer: >90 mL/min (ref >90)
Glucose, Bld: 102 mg/dL — ABNORMAL HIGH (ref 70–99)
Potassium: 3.7 mmol/L (ref 3.5–5.1)
Sodium: 142 mmol/L (ref 135–145)
Total Bilirubin: 1.4 mg/dL — ABNORMAL HIGH (ref 0.3–1.2)
Total Protein: 5.1 g/dL — ABNORMAL LOW (ref 6.0–8.3)

## 2014-10-14 LAB — LACTATE DEHYDROGENASE: LDH: 340 U/L — ABNORMAL HIGH (ref 94–250)

## 2014-10-14 MED ORDER — CETYLPYRIDINIUM CHLORIDE 0.05 % MT LIQD
7.0000 mL | Freq: Two times a day (BID) | OROMUCOSAL | Status: DC
  Administered 2014-10-14 – 2014-10-31 (×17): 7 mL via OROMUCOSAL

## 2014-10-14 MED ORDER — POTASSIUM CHLORIDE 10 MEQ/50ML IV SOLN
10.0000 meq | INTRAVENOUS | Status: AC
  Administered 2014-10-14 (×2): 10 meq via INTRAVENOUS
  Filled 2014-10-14 (×2): qty 50

## 2014-10-14 MED ORDER — CHLORHEXIDINE GLUCONATE 0.12 % MT SOLN
15.0000 mL | Freq: Two times a day (BID) | OROMUCOSAL | Status: DC
  Administered 2014-10-14 – 2014-10-31 (×18): 15 mL via OROMUCOSAL
  Filled 2014-10-14 (×41): qty 15

## 2014-10-14 MED ORDER — DEXTROMETHORPHAN POLISTIREX ER 30 MG/5ML PO SUER
30.0000 mg | Freq: Two times a day (BID) | ORAL | Status: DC
  Administered 2014-10-14 – 2014-11-02 (×26): 30 mg via ORAL
  Filled 2014-10-14 (×40): qty 5

## 2014-10-14 MED ORDER — INSULIN ASPART 100 UNIT/ML ~~LOC~~ SOLN
0.0000 [IU] | Freq: Three times a day (TID) | SUBCUTANEOUS | Status: DC
Start: 2014-10-14 — End: 2014-10-23
  Administered 2014-10-14 (×2): 2 [IU] via SUBCUTANEOUS
  Administered 2014-10-15: 3 [IU] via SUBCUTANEOUS
  Administered 2014-10-15 – 2014-10-22 (×6): 2 [IU] via SUBCUTANEOUS

## 2014-10-14 MED ORDER — INSULIN ASPART 100 UNIT/ML ~~LOC~~ SOLN: 0.0000 [IU] | Freq: Every day | SUBCUTANEOUS | Status: DC

## 2014-10-14 MED ORDER — GUAIFENESIN 100 MG/5ML PO SOLN
15.0000 mL | Freq: Four times a day (QID) | ORAL | Status: DC
  Administered 2014-10-14 (×2): 300 mg via ORAL
  Filled 2014-10-14 (×16): qty 15

## 2014-10-14 MED ORDER — FENTANYL CITRATE (PF) 100 MCG/2ML IJ SOLN: 25.0000 ug | Freq: Four times a day (QID) | INTRAMUSCULAR | Status: DC | PRN

## 2014-10-14 MED ORDER — POTASSIUM CHLORIDE 10 MEQ/50ML IV SOLN
10.0000 meq | INTRAVENOUS | Status: AC | PRN
  Administered 2014-10-14 (×3): 10 meq via INTRAVENOUS
  Filled 2014-10-14: qty 50

## 2014-10-14 MED ORDER — TRAZODONE HCL 50 MG PO TABS
50.0000 mg | ORAL_TABLET | Freq: Every day | ORAL | Status: DC
  Administered 2014-10-14 – 2014-10-15 (×2): 50 mg via ORAL
  Filled 2014-10-14 (×3): qty 1

## 2014-10-14 MED ORDER — DEXTROMETHORPHAN POLISTIREX 30 MG/5ML PO LQCR
30.0000 mg | Freq: Two times a day (BID) | ORAL | Status: DC
  Administered 2014-10-14: 30 mg via ORAL
  Filled 2014-10-14 (×2): qty 5

## 2014-10-14 NOTE — Progress Notes (Signed)
Patient ID: Scott [name], male   DOB: [birth year], 69 y.o.   MRN: [id number] HeartMate 2 Rounding Note  Subjective:    S/P HMII LVAD on 09/20/2013.   Moved to ICU  4/5 due to low MAP and low hemoglobin. Taken Back to the OR 4/5 and 4/6  for recurrent bleed and evacuation of clots from left chest.   On 4 liters [place].  Off milrinone. CO-OX today 87%. Hgb 8.8.  Weight down 7 pounds. Ambulated with PT.   Denies SOB.   LVAD INTERROGATION:  HeartMate II LVAD:  Flow 5.2  liters/min, speed 9400, power 6 , PI 6 No PI events.    Objective:    Vital Signs:   Temp:  [97.4 F (36.3 C)-98.8 F (37.1 C)] 97.6 F (36.4 C) (04/14 0358) Pulse Rate:  [67-119] 74 (04/14 0700) Resp:  [15-37] 33 (04/14 0700) SpO2:  [95 %-100 %] 95 % (04/14 0700) FiO2 (%):  [4 %-40 %] 40 % (04/14 0500) Weight:  [181 lb 3.5 oz (82.2 kg)] 181 lb 3.5 oz (82.2 kg) (04/14 0500) Last BM Date: 10/13/14 Mean arterial Pressure  80  Intake/Output:   Intake/Output Summary (Last 24 hours) at 10/14/14 0739 Last data filed at 10/14/14 3295  Gross per 24 hour  Intake   1230 ml  Output   3861 ml  Net  -2631 ml     Physical Exam: CVP 3 General: Sitting in chair  HEENT: normal Neck: supple. Carotids 2+ bilat; no bruits. No lymphadenopathy or thryomegaly appreciated. R neck [place]  Cor: Mechanical heart sounds with LVAD hum present. Lungs: RML RLL LLL crackles. On 4 liters [place].  Abdomen: soft, nontender, nondistended. No hepatosplenomegaly. No bruits or masses. Good bowel sounds. Driveline: Dressing looks good.  Extremities: no cyanosis, clubbing, rash.  Trace edema and R and LLE compression stockings Neuro: Awake alert GU: foley   Telemetry: NSR 70-80s   Labs: Basic Metabolic Panel:  Recent Labs Lab 10/08/14 0400  10/09/14 0625  10/10/14 0400 10/10/14 0749  10/11/14 0503  10/12/14 0420 10/12/14 2227 10/13/14 0340 10/13/14 1615 10/14/14 0400  NA 136  < >  --   < >  --  134*  < > 136  < > 138 122* 138 139  142  K 3.6  < >  --   < >  --  3.3*  < > 3.4*  < > 3.3* 3.0* 4.0 3.6 3.7  CL 101  < >  --   < >  --  92*  < > 94*  < > 94* 91* 94* 93* 97  CO2 31  --   --   < >  --  36*  --  43*  --  38*  --  36*  --  38*  GLUCOSE 141*  < >  --   < >  --  191*  < > 90  < > 127* 556* 87 101* 102*  BUN 16  < >  --   < >  --  12  < > 8  < > 14 14 15 16 13   CREATININE 0.62  < >  --   < >  --  0.59  < > 0.58  < > 0.81 0.90 0.85 0.90 0.78  CALCIUM 7.7*  --   --   < >  --  7.3*  --  7.6*  --  8.0*  --  8.3*  --  8.6  MG 1.8  --  1.6  --  1.6  --   --  1.8  --   --   --   --   --   --   < > = values in this interval not displayed.  Liver Function Tests:  Recent Labs Lab 10/10/14 0749 10/11/14 0503 10/12/14 0420 10/13/14 0340 10/14/14 0400  AST 28 29 37 30 33  ALT 19 21 23 24 24   ALKPHOS 207* 196* 244* 254* 275*  BILITOT 1.2 1.5* 1.3* 1.5* 1.4*  PROT 4.8* 5.0* 4.9* 5.7* 5.1*  ALBUMIN 1.7* 1.9* 1.8* 1.9* 2.0*   No results for input(s): LIPASE, AMYLASE in the last 168 hours. No results for input(s): AMMONIA in the last 168 hours.  CBC:  Recent Labs Lab 10/12/14 0420 10/12/14 1400 10/12/14 2227 10/13/14 0805 10/13/14 0840 10/13/14 1615 10/14/14 0400  WBC 14.6* 14.9*  --  10.3 12.7*  --  11.7*  HGB 8.4* 9.1* 8.5* 6.6* 8.9* 10.5* 8.8*  HCT 27.5* 29.9* 25.0* 22.1* 29.9* 31.0* 29.3*  MCV 97.5 97.4  --  99.1 98.0  --  98.3  PLT 397 446*  --  343 482*  --  502*    INR:  Recent Labs Lab 10/10/14 0400 10/11/14 0503 10/12/14 0420 10/13/14 0340 10/14/14 0400  INR 1.24 1.21 1.37 1.41 1.51*    Other results:    Imaging: Dg Chest Port 1 View  10/13/2014   CLINICAL DATA:  Post LVAD placement.  EXAM: PORTABLE CHEST - 1 VIEW  COMPARISON:  10/12/2014  FINDINGS: Left basilar chest tube remains. No pneumothorax is identified. There is some residual pleural thickening versus effusion on the left. Visualize lateral aspect of the LVAD is stable in appearance. There is stable atelectasis/consolidation  at the lateral right lung base. Right PICC line positioning is stable. Cardiomegaly is without change. No overt pulmonary edema.  IMPRESSION: No pneumothorax. Residual pleural thickening versus effusion on the left appears stable. Stable right lower lung atelectasis/consolidation.   Electronically Signed   By: Aletta Edouard M.D.   On: 10/13/2014 08:16     Medications:     Scheduled Medications: . acetaminophen (TYLENOL) oral liquid 160 mg/5 mL  650 mg Per Tube Once   Or  . acetaminophen  650 mg Rectal Once  . antiseptic oral rinse  7 mL Mouth Rinse BID  . budesonide-formoterol  2 puff Inhalation BID  . citalopram  20 mg Oral Daily  . dextromethorphan-guaiFENesin  1 tablet Oral BID  . [START ON 10/15/2014] furosemide  40 mg Oral Daily  . insulin aspart  0-24 Units Subcutaneous 6 times per day  . pantoprazole  40 mg Oral QHS  . piperacillin-tazobactam (ZOSYN)  IV  3.375 g Intravenous 3 times per day  . sodium chloride  10 mL Intravenous Q12H  . vancomycin  1,000 mg Intravenous Q12H  . warfarin  2.5 mg Oral q1800  . Warfarin - Physician Dosing Inpatient   Does not apply q1800    Infusions: . sodium chloride 20 mL/hr at 10/11/14 1400  . lactated ringers 20 mL/hr at 10/11/14 2000  . norepinephrine (LEVOPHED) Adult infusion Stopped (10/11/14 0100)    PRN Medications: sodium chloride, oxyCODONE **AND** acetaminophen, fentaNYL, levalbuterol, ondansetron (ZOFRAN) IV, potassium chloride, RESOURCE THICKENUP CLEAR, sodium chloride, traMADol   Assessment:    1.  A/C systolic HF class IV with cardiogenic shock- EF 15% with moderate RV dysfunction 3/16 echo.         -S/P HMII LVAD 09/21/14  2. CAD s/p Anterior STEMI on 06/12/14 with  stenting of LAD- was on coumadin + plavix, statin.  Now holding coumadin, off Plavix.  3. Acute on chronic hypercarbic respiratory failure  4. 06/12/14 VT in setting of STEMI 5. Ankylosing spondylitis. 6. Bilateral PE with R DVT- S/P IVC filter  08/27/2014--->coumadin 7. Acute Blood Loss into pleural space:  Back to OR 4/5 and 4/6 for recurrent bleed. Received multiple blood products.  8. PAF: On amiodarone.  Plan/Discussion:   S/P 09/21/2014 HMII LVAD.   Back to OR 4/5 and and again 4/6 for recurrent bleed and evacuation clots. Received multiple blood products. Hemoglobin 8.8 . Per Dr Darcey Nora if < 8 will transfuse.   Continue nightly bipap.  On 4 liters [place]. Having difficulty sleeping. Add home dose trazodone.   CO-OX 87% off milrinone. Hold lasix today and start lasix 40 mg po daily on Friday.    H/o VT and atrial fibrillation, no further atrial fibrillation.  On amiodarone gtt.  Supp K . Check Mag tomorrow.   On coumadin. INR 1.5. Goal INR 1.5-2.   Will not restart Plavix as anterior wall completely necrotic.   LVAD parameters stable.    I reviewed the LVAD parameters from today, and compared the results to the patient's prior recorded data.  No programming changes were made.  The LVAD is functioning within specified parameters.  The patient performs LVAD self-test daily.  LVAD interrogation was negative for any significant power changes, alarms or PI events/speed drops.  LVAD equipment check completed and is in good working order.  Back-up equipment present.   LVAD education done on emergency procedures and precautions and reviewed exit site care.   Length of Stay: 41  CLEGG,AMY  NP-C  10/14/2014, 7:39 AM  VAD Team --- VAD ISSUES ONLY--- Pager [phone] (7am - 7am)  Advanced Heart Failure Team  Pager [phone] (M-F; 7a - 4p)  Please contact [facility] for night-coverage after hours (4p -7a ) and weekends on [email or web address]   Patient seen and examined with Darrick Grinder, NP. We discussed all aspects of the encounter. I agree with the assessment and plan as stated above.   Stable off milrinone. Volume status ok. Hgb stable. Ambulating. INR coming up slowly. VAD parameters ok. Will continue current regimen. With acetazolamide on  board may not need much lsix.   Scott Alliene Klugh,MD 1:51 PM

## 2014-10-14 NOTE — Progress Notes (Signed)
Physical Therapy Treatment Patient Details Name: Scott [name] MRN: [id number] DOB: 12/16/1945 Today's Date: 10/14/2014    History of Present Illness 69 y.o. M with extensive cardiac hx as well as recent PE / DVT, admitted for SOB due to heart failure, cardiogenic, shock, PE. Evaluated by VAD team. Found to have mod - large R pleural effusion. LVAD implantation 09/22/14. Progressed well until developed hemothorax and required return to OR 4/5 and 4/6 for Redo sternotomy, reexploration of chest and removal of hematoma.    PT Comments    Pt progressing well with activity tolerance (although still diminished). PI number stable at 6.0-6.2. Lt ankle dorsiflexion strength has improved. Wife is capable of switching pt from console to battery power and back. Pt required assistance to fully "seat" the connection to one battery due to hand weakness/decr coordination.   Follow Up Recommendations  Home health PT;Supervision/Assistance - 24 hour     Equipment Recommendations   (2 wheeled RW with fold down seat)    Recommendations for Other Services       Precautions / Restrictions Precautions Precautions: Sternal;Fall Precaution Comments: LVAD    Mobility  Bed Mobility               General bed mobility comments: in chair  Transfers Overall transfer level: Needs assistance Equipment used: Rolling walker (2 wheeled);None Transfers: Sit to/from Stand Sit to Stand: +2 physical assistance;+2 safety/equipment;Mod assist         General transfer comment: from recliner; pt with excessive hip flexion and anterior translation, however then needs assist to extend knees and hip to stand; vc not to reach back and use arms/armrest to sit  Ambulation/Gait Ambulation/Gait assistance: Min assist;+2 safety/equipment (and wife pushed chair behind) Ambulation Distance (Feet): 150 Feet Assistive device: Rolling walker (2 wheeled) Gait Pattern/deviations: Step-through pattern;Trunk  flexed;Decreased stride length   Gait velocity interpretation: Below normal speed for age/gender General Gait Details: tolerated well; RW with seat which reduces his stride length; initially on RA however pulse oximeter unable to register and placed pt on 2L O2 as he became SOB; standing rest breaks with pursed lip breathing x 2 (~30 seconds each)   Stairs            Wheelchair Mobility    Modified Rankin (Stroke Patients Only)       Balance     Sitting balance-Leahy Scale: Fair       Standing balance-Leahy Scale: Poor                      Cognition Arousal/Alertness: Awake/alert Behavior During Therapy: Flat affect Overall Cognitive Status: Within Functional Limits for tasks assessed                      Exercises Other Exercises Other Exercises: Pt with 2+ Lt dorsiflexion today. No foot drop during ambulation    General Comments General comments (skin integrity, edema, etc.): PI stable; pt with difficulty switching console to battery (due to limited hand strength); was able to complete battery to console       Pertinent Vitals/Pain Pain Assessment: No/denies pain    Home Living                      Prior Function            PT Goals (current goals can now be found in the care plan section) Acute Rehab PT Goals Patient Stated Goal: to go  home  Time For Goal Achievement: 10/25/14 Progress towards PT goals: Progressing toward goals    Frequency  Min 3X/week    PT Plan Current plan remains appropriate    Co-evaluation             End of Session Equipment Utilized During Treatment: Oxygen Activity Tolerance: Patient limited by fatigue Patient left: in chair;with call bell/phone within reach;with family/visitor present     Time: 0315-9458 PT Time Calculation (min) (ACUTE ONLY): 31 min  Charges:  $Gait Training: 23-37 mins                    G Codes:      Keola Heninger 12-Nov-2014, 2:55 PM Pager [phone]

## 2014-10-14 NOTE — Progress Notes (Signed)
HeartMate 2 Rounding Note  Subjective:   POD #22  VAD implant  (DT)  For acute /chronic systolic HF from ischemic cardiomyopathy and cardiogenic shock on preop IABP  POD#8 re-exploration for bleeding - coagulopathy/ oozing in pump pocket- all pump anastomoses dry  Prior hx PE- with large R pulmonary infarct,necrotic    lung/pneumonia- poss empyema on IV Zosyn- brown sputum Restrictive lung disease- FEV1 1.3 from ankylosing spondylitis and CO2 retention DM  HX ventricular, atrial arrhythmia Postoperative CO2 retention requiring BiPAP  Every HS Postoperative coagulopathy associated with INR 2.7  Patient ambulated 75 feet with physical therapy, getting stronger Co-ox consistently > 75% off mil, will stop daily Co-ox L pleutal and pump pocket drains still remain > 300/day- leave until < 100 cc  2D echo  with good RV fx, LV decompressed by VAD  Patient extubated 4-10- PCO2 up due to restrictive thoracic deformity- reduced compliance Placed on  BIPAP over night-  cough today more dry Diuresing well - lasix  changed to daily oral dose Panda tube removed after swallowing evaluation recommended dysphagia diet with nectar thick liquids Low dose coumadin started -INR goal 1.5 - 2.0.  Anticoagulation difficult situation with hx of DVT and large pulmonary infarct but with severe bleeding into surgical pump pocket with INR 2.7 which required 2 re- explorations to stop Hb Remained stable at 9.1  ,CXR and drains w/o evidence of rebleeding- persistent drainage from pocket tube and left pleural tube greater than 300 cc per day so we'll leave drains in place  Elevated WBC  cont IV Fortaz and vancomycin- cultures all neg- suspect pulmonary source- will mobilize OOB today. WBC better - 12k  HeartMate II LVAD:  Flow 4.5 liters/min, speed 9400 rpm power 6.1, PI  6.0 , no PI events in 24 hrs  Controller intact-   Objective:    Vital Signs:   Temp:  [97.4 F (36.3 C)-98.8 F (37.1 C)] 97.9 F (36.6 C)  (04/14 0820) Pulse Rate:  [67-119] 72 (04/14 0800) Resp:  [15-37] 25 (04/14 0800) SpO2:  [95 %-100 %] 98 % (04/14 0822) FiO2 (%):  [40 %] 40 % (04/14 0500) Weight:  [181 lb 3.5 oz (82.2 kg)] 181 lb 3.5 oz (82.2 kg) (04/14 0500) Last BM Date: 10/14/14 Mean arterial Pressure 78  Intake/Output:   Intake/Output Summary (Last 24 hours) at 10/14/14 0831 Last data filed at 10/14/14 0800  Gross per 24 hour  Intake   1270 ml  Output   3701 ml  Net  -2431 ml     Physical Exam: General:extubated , alert- sitting up in chair HEENT: normal Neck: supple.  noJVP   no bruits. No lymphadenopathy or thryomegaly appreciated. Cor: Mechanical heart sounds with LVAD hum present. Lungs: bibasilar scattered rhonchi Abdomen: soft, nontender, nondistended. No hepatosplenomegaly. No bruits or masses. Good bowel sounds. Driveline: C/D/I; securement device intact and driveline incorporated Extremities: warm well perfused,no cyanosis, clubbing, rash, edema- Neuro: responsive without focal weakness Sternal incision dry   Telemetry:nsr  Labs: Basic Metabolic Panel:  Recent Labs Lab 10/08/14 0400  10/09/14 0625  10/10/14 0400 10/10/14 0749  10/11/14 0503  10/12/14 0420 10/12/14 2227 10/13/14 0340 10/13/14 1615 10/14/14 0400  NA 136  < >  --   < >  --  134*  < > 136  < > 138 122* 138 139 142  K 3.6  < >  --   < >  --  3.3*  < > 3.4*  < > 3.3* 3.0* 4.0  3.6 3.7  CL 101  < >  --   < >  --  92*  < > 94*  < > 94* 91* 94* 93* 97  CO2 31  --   --   < >  --  36*  --  43*  --  38*  --  36*  --  38*  GLUCOSE 141*  < >  --   < >  --  191*  < > 90  < > 127* 556* 87 101* 102*  BUN 16  < >  --   < >  --  12  < > 8  < > 14 14 15 16 13   CREATININE 0.62  < >  --   < >  --  0.59  < > 0.58  < > 0.81 0.90 0.85 0.90 0.78  CALCIUM 7.7*  --   --   < >  --  7.3*  --  7.6*  --  8.0*  --  8.3*  --  8.6  MG 1.8  --  1.6  --  1.6  --   --  1.8  --   --   --   --   --   --   < > = values in this interval not  displayed.  Liver Function Tests:  Recent Labs Lab 10/10/14 0749 10/11/14 0503 10/12/14 0420 10/13/14 0340 10/14/14 0400  AST 28 29 37 30 33  ALT 19 21 23 24 24   ALKPHOS 207* 196* 244* 254* 275*  BILITOT 1.2 1.5* 1.3* 1.5* 1.4*  PROT 4.8* 5.0* 4.9* 5.7* 5.1*  ALBUMIN 1.7* 1.9* 1.8* 1.9* 2.0*   No results for input(s): LIPASE, AMYLASE in the last 168 hours. No results for input(s): AMMONIA in the last 168 hours.  CBC:  Recent Labs Lab 10/12/14 0420 10/12/14 1400 10/12/14 2227 10/13/14 0805 10/13/14 0840 10/13/14 1615 10/14/14 0400  WBC 14.6* 14.9*  --  10.3 12.7*  --  11.7*  HGB 8.4* 9.1* 8.5* 6.6* 8.9* 10.5* 8.8*  HCT 27.5* 29.9* 25.0* 22.1* 29.9* 31.0* 29.3*  MCV 97.5 97.4  --  99.1 98.0  --  98.3  PLT 397 446*  --  343 482*  --  502*    INR:  Recent Labs Lab 10/10/14 0400 10/11/14 0503 10/12/14 0420 10/13/14 0340 10/14/14 0400  INR 1.24 1.21 1.37 1.41 1.51*    Other results:  EKG:   Imaging: Dg Chest Port 1 View  10/14/2014   CLINICAL DATA:  Left ventricular assist device present. Difficulty breathing  EXAM: PORTABLE CHEST - 1 VIEW  COMPARISON:  October 13, 2014  FINDINGS: Central catheter tip is at the cavoatrial junction. No pneumothorax. There is persistent airspace consolidation the right lower lobe lateral. There is also patchy infiltrate in the left base and medial right base, stable. Heart is enlarged with left ventricular assist device present. There are chest tubes on the left side, stable.  IMPRESSION: Tube and catheter positions unchanged without pneumothorax. Left ventricular assist device present. Bibasilar consolidation as well as a focal area of infiltrate/ consolidation in the lateral right base. No new opacity. No change in cardiac silhouette.   Electronically Signed   By: Lowella Grip III M.D.   On: 10/14/2014 08:07   Dg Chest Port 1 View  10/13/2014   CLINICAL DATA:  Post LVAD placement.  EXAM: PORTABLE CHEST - 1 VIEW  COMPARISON:   10/12/2014  FINDINGS: Left basilar chest tube remains. No pneumothorax is  identified. There is some residual pleural thickening versus effusion on the left. Visualize lateral aspect of the LVAD is stable in appearance. There is stable atelectasis/consolidation at the lateral right lung base. Right PICC line positioning is stable. Cardiomegaly is without change. No overt pulmonary edema.  IMPRESSION: No pneumothorax. Residual pleural thickening versus effusion on the left appears stable. Stable right lower lung atelectasis/consolidation.   Electronically Signed   By: Aletta Edouard M.D.   On: 10/13/2014 08:16     Medications:     Scheduled Medications: . acetaminophen (TYLENOL) oral liquid 160 mg/5 mL  650 mg Per Tube Once   Or  . acetaminophen  650 mg Rectal Once  . antiseptic oral rinse  7 mL Mouth Rinse BID  . budesonide-formoterol  2 puff Inhalation BID  . citalopram  20 mg Oral Daily  . dextromethorphan-guaiFENesin  1 tablet Oral BID  . [START ON 10/15/2014] furosemide  40 mg Oral Daily  . insulin aspart  0-15 Units Subcutaneous TID WC  . insulin aspart  0-5 Units Subcutaneous QHS  . pantoprazole  40 mg Oral QHS  . piperacillin-tazobactam (ZOSYN)  IV  3.375 g Intravenous 3 times per day  . potassium chloride  10 mEq Intravenous Q1 Hr x 2  . sodium chloride  10 mL Intravenous Q12H  . traZODone  50 mg Oral QHS  . vancomycin  1,000 mg Intravenous Q12H  . warfarin  2.5 mg Oral q1800  . Warfarin - Physician Dosing Inpatient   Does not apply q1800    Infusions: . sodium chloride 20 mL/hr at 10/11/14 1400  . lactated ringers 20 mL/hr at 10/11/14 2000    PRN Medications: sodium chloride, oxyCODONE **AND** acetaminophen, fentaNYL, levalbuterol, ondansetron (ZOFRAN) IV, potassium chloride, RESOURCE THICKENUP CLEAR, sodium chloride, traMADol   Assessment:  Stable hemodynamics- , VAD speed  now 9400  CO-OX >70%  Successfully extubated but needs HS BIPAP CO2 elevated from restrictive  lung dz - bicarb lower after a dose of Diamox Cont IV Zosyn, vanco  for possible pneumonia, empyema from past pulmonary infarct Coumadin dosed   for INR 1.5-2.0     -2.5 mg daily Plan/Discussion:   Mobilize  OOB with PT Should be ready for tx to stepdown soon  Remove drains when total  drainage less than 100 cc per day Cont coumadin 2.5 /day  Swallow study completed, SLT following continue dysphagia diet   I reviewed the LVAD parameters from today, and compared the results to the patient's prior recorded data.  No programming changes were made.  The LVAD is functioning within specified parameters.  The patient performs LVAD self-test daily.  LVAD interrogation was negative for any significant power changes, alarms or PI events/speed drops.  LVAD equipment check completed and is in good working order.  Back-up equipment present.   LVAD education done on emergency procedures and precautions and reviewed exit site care.  Length of Stay: [place] III 10/14/2014, 8:31 AM

## 2014-10-14 NOTE — Progress Notes (Signed)
CSW met with wife at bedside. Wife reports dressing change in process. Wife states that patient is tired today but making improvements. CSW will continue to follow for support throughout recovery. Raquel Sarna, [place]

## 2014-10-14 NOTE — Progress Notes (Signed)
T[facility] BRIEF SICU PROGRESS NOTE  8 Days Post-Op  S/P Procedure(s) (LRB): VIDEO ASSISTED THORACOSCOPY (VATS)/THOROCOTOMY (Left)   Stable day NSR w/ stable MAP, VAD flows UOP adequate  Plan: Continue current plan  Rexene Alberts 10/14/2014 8:42 PM

## 2014-10-14 NOTE — Progress Notes (Signed)
Speech Language Pathology Treatment: Dysphagia  Patient Details Name: Scott [name] MRN: [id number] DOB: [birth year] Today's Date: 10/14/2014 Time: 8115-7262 SLP Time Calculation (min) (ACUTE ONLY): 11 min  Assessment / Plan / Recommendation Clinical Impression  Diagnostic treatment complete with focus on po trials to assess readiness for diet upgrade. Patient had consumed lunch just prior to SLP arrival, declining further po trials with the exception of ice chips. Instructed patient to complete oral care prior to ice chip trials to increase safety, decreasing risk of an aspiration related infection. Patient able to complete oral care independently. Consumption of ice chips resulted in minimal s/s of decreased airway protection characterized by delayed cough (? Relation). Otherwise, patient with stable vitals and vocal quality which remains significantly dysphonic, perhaps mildly worse than during initial evaluation, likely fatigue. Overall, based on vital signs, patient appears to be tolerating current diet. Instructed patient to continue with current diet, utilizing aspiration precautions and focusing on quick, effortful swallows to aid in improved timing and strength. Will plan for f/u FEES 4/18.    HPI HPI: 69 y.o. M with extensive cardiac hx as well as recent PE / DVT, admitted for SOB due to heart failure, cardiogenic, shock, PE. Evaluated by VAD team. Found to have mod - large R pleural effusion. LVAD implantation 09/22/14. Progressed well until developed hemothorax and required return to OR 4/5 and 4/6 for Redo sternotomy, reexploration of chest and removal of hematoma.   Pertinent Vitals Pain Assessment: No/denies pain  SLP Plan  Continue with current plan of care (plan for FEES 4/15)    Recommendations Diet recommendations: Dysphagia 2 (fine chop);Nectar-thick liquid Liquids provided via: Teaspoon Medication Administration: Crushed with puree Supervision: Patient able to self  feed;Intermittent supervision to cue for compensatory strategies Compensations: Slow rate;Small sips/bites;Multiple dry swallows after each bite/sip Postural Changes and/or Swallow Maneuvers: Seated upright 90 degrees;Upright 30-60 min after meal              Oral Care Recommendations: Oral care BID Follow up Recommendations: Inpatient Rehab Plan: Continue with current plan of care (plan for FEES 4/15)    GO   Gabriel Rainwater MA, CCC-SLP [phone]   Lyndon [place] Meryl 10/14/2014, 3:19 PM

## 2014-10-15 ENCOUNTER — Inpatient Hospital Stay (HOSPITAL_COMMUNITY): Payer: Medicare Other

## 2014-10-15 LAB — COMPREHENSIVE METABOLIC PANEL
ALT: 25 U/L (ref 0–53)
AST: 34 U/L (ref 0–37)
Albumin: 2 g/dL — ABNORMAL LOW (ref 3.5–5.2)
Alkaline Phosphatase: 284 U/L — ABNORMAL HIGH (ref 39–117)
Anion gap: 8 (ref 5–15)
BUN: 10 mg/dL (ref 6–23)
CO2: 34 mmol/L — ABNORMAL HIGH (ref 19–32)
Calcium: 8.5 mg/dL (ref 8.4–10.5)
Chloride: 102 mmol/L (ref 96–112)
Creatinine, Ser: 0.76 mg/dL (ref 0.50–1.35)
GFR calc Af Amer: >90 mL/min (ref >90)
GFR calc non Af Amer: >90 mL/min (ref >90)
Glucose, Bld: 101 mg/dL — ABNORMAL HIGH (ref 70–99)
Potassium: 3.7 mmol/L (ref 3.5–5.1)
Sodium: 144 mmol/L (ref 135–145)
Total Bilirubin: 1.3 mg/dL — ABNORMAL HIGH (ref 0.3–1.2)
Total Protein: 5.5 g/dL — ABNORMAL LOW (ref 6.0–8.3)

## 2014-10-15 LAB — GLUCOSE, CAPILLARY
GLUCOSE-CAPILLARY: 165 mg/dL — AB (ref 70–99)
GLUCOSE-CAPILLARY: 135 mg/dL — AB (ref 70–99)
Glucose-Capillary: 137 mg/dL — ABNORMAL HIGH (ref 70–99)
Glucose-Capillary: 101 mg/dL — ABNORMAL HIGH (ref 70–99)

## 2014-10-15 LAB — CBC
HCT: 29.1 % — ABNORMAL LOW (ref 39.0–52.0)
Hemoglobin: 8.8 g/dL — ABNORMAL LOW (ref 13.0–17.0)
MCH: 29.3 pg (ref 26.0–34.0)
MCHC: 30.2 g/dL (ref 30.0–36.0)
MCV: 97 fL (ref 78.0–100.0)
Platelets: 548 10*3/uL — ABNORMAL HIGH (ref 150–400)
RBC: 3 MIL/uL — ABNORMAL LOW (ref 4.22–5.81)
RDW: 17.5 % — ABNORMAL HIGH (ref 11.5–15.5)
WBC: 12.9 10*3/uL — ABNORMAL HIGH (ref 4.0–10.5)

## 2014-10-15 LAB — PROTIME-INR
INR: 1.49 (ref 0.00–1.49)
PROTHROMBIN TIME: 18.2 s — AB (ref 11.6–15.2)

## 2014-10-15 LAB — MAGNESIUM: Magnesium: 2.1 mg/dL (ref 1.5–2.5)

## 2014-10-15 LAB — LACTATE DEHYDROGENASE: LDH: 394 U/L — AB (ref 94–250)

## 2014-10-15 MED ORDER — POTASSIUM CHLORIDE 10 MEQ/50ML IV SOLN
10.0000 meq | INTRAVENOUS | Status: AC
  Administered 2014-10-15 (×3): 10 meq via INTRAVENOUS

## 2014-10-15 MED ORDER — POTASSIUM CHLORIDE 10 MEQ/50ML IV SOLN
INTRAVENOUS | Status: AC
  Filled 2014-10-15: qty 150

## 2014-10-15 MED ORDER — HYDRALAZINE HCL 20 MG/ML IJ SOLN
10.0000 mg | INTRAMUSCULAR | Status: DC | PRN
  Administered 2014-10-15 – 2014-10-28 (×8): 10 mg via INTRAVENOUS
  Filled 2014-10-15 (×8): qty 1

## 2014-10-15 NOTE — Progress Notes (Signed)
Patient ID: Scott [name], male   DOB: [birth year], 69 y.o.   MRN: [id number] HeartMate 2 Rounding Note  Subjective:    S/P HMII LVAD on 09/20/2013.   Moved to ICU  4/5 due to low MAP and low hemoglobin. Taken Back to the OR 4/5 and 4/6  for recurrent bleed and evacuation of clots from left chest.   On 4 liters [place].  Off milrinone. CO-OX today 87%. Hgb 8.8.  Weight down 7 pounds. Ambulated with PT.   Denies SOB.   LVAD INTERROGATION:  HeartMate II LVAD:  Flow 5.2  liters/min, speed 9400, power 6 , PI 6 No PI events.    Objective:    Vital Signs:   Temp:  [96.7 F (35.9 C)-98.4 F (36.9 C)] 96.7 F (35.9 C) (04/15 0700) Pulse Rate:  [64-99] 92 (04/15 1000) Resp:  [16-34] 25 (04/15 1000) SpO2:  [90 %-100 %] 94 % (04/15 1000) Weight:  [82.3 kg (181 lb 7 oz)] 82.3 kg (181 lb 7 oz) (04/15 0500) Last BM Date: 10/15/14 Mean arterial Pressure  80  Intake/Output:   Intake/Output Summary (Last 24 hours) at 10/15/14 1024 Last data filed at 10/15/14 0900  Gross per 24 hour  Intake   1190 ml  Output   1865 ml  Net   -675 ml     Physical Exam: CVP 3 General: Sitting in chair  HEENT: normal Neck: supple. Carotids 2+ bilat; no bruits. No lymphadenopathy or thryomegaly appreciated. R neck [place]  Cor: Mechanical heart sounds with LVAD hum present. Lungs: RML RLL LLL crackles. On 4 liters [place].  Abdomen: soft, nontender, nondistended. No hepatosplenomegaly. No bruits or masses. Good bowel sounds. Driveline: Dressing looks good.  Extremities: no cyanosis, clubbing, rash.  Trace edema and R and LLE compression stockings Neuro: Awake alert GU: foley   Telemetry: NSR 70-80s   Labs: Basic Metabolic Panel:  Recent Labs Lab 10/09/14 0625  10/10/14 0400  10/11/14 0503  10/12/14 0420 10/12/14 2227 10/13/14 0340 10/13/14 1615 10/14/14 0400 10/15/14 0450  NA  --   < >  --   < > 136  < > 138 122* 138 139 142 144  K  --   < >  --   < > 3.4*  < > 3.3* 3.0* 4.0 3.6 3.7 3.7  CL  --    < >  --   < > 94*  < > 94* 91* 94* 93* 97 102  CO2  --   < >  --   < > 43*  --  38*  --  36*  --  38* 34*  GLUCOSE  --   < >  --   < > 90  < > 127* 556* 87 101* 102* 101*  BUN  --   < >  --   < > 8  < > 14 14 15 16 13 10   CREATININE  --   < >  --   < > 0.58  < > 0.81 0.90 0.85 0.90 0.78 0.76  CALCIUM  --   < >  --   < > 7.6*  --  8.0*  --  8.3*  --  8.6 8.5  MG 1.6  --  1.6  --  1.8  --   --   --   --   --   --  2.1  < > = values in this interval not displayed.  Liver Function Tests:  Recent Labs Lab 10/11/14 0503 10/12/14  9323 10/13/14 0340 10/14/14 0400 10/15/14 0450  AST 29 37 30 33 34  ALT 21 23 24 24 25   ALKPHOS 196* 244* 254* 275* 284*  BILITOT 1.5* 1.3* 1.5* 1.4* 1.3*  PROT 5.0* 4.9* 5.7* 5.1* 5.5*  ALBUMIN 1.9* 1.8* 1.9* 2.0* 2.0*   No results for input(s): LIPASE, AMYLASE in the last 168 hours. No results for input(s): AMMONIA in the last 168 hours.  CBC:  Recent Labs Lab 10/12/14 1400  10/13/14 0805 10/13/14 0840 10/13/14 1615 10/14/14 0400 10/15/14 0450  WBC 14.9*  --  10.3 12.7*  --  11.7* 12.9*  HGB 9.1*  < > 6.6* 8.9* 10.5* 8.8* 8.8*  HCT 29.9*  < > 22.1* 29.9* 31.0* 29.3* 29.1*  MCV 97.4  --  99.1 98.0  --  98.3 97.0  PLT 446*  --  343 482*  --  502* 548*  < > = values in this interval not displayed.  INR:  Recent Labs Lab 10/11/14 0503 10/12/14 0420 10/13/14 0340 10/14/14 0400 10/15/14 0450  INR 1.21 1.37 1.41 1.51* 1.49    Other results:    Imaging: Dg Chest Port 1 View  10/15/2014   CLINICAL DATA:  LEFT ventricular assist device  EXAM: PORTABLE CHEST - 1 VIEW  COMPARISON:  Portable exam 0723 hours compared to 10/14/2014  FINDINGS: LEFT ventricular assist device projects over the inferior aspect of cardiac silhouette LEFT upper quadrant.  RIGHT arm PICC line tip projects over cavoatrial junction.  Enlargement of cardiac silhouette post median sternotomy.  Mediastinal contours and pulmonary vascularity normal.  Slightly increased  consolidation at the lateral RIGHT middle lobe.  Minimal bibasilar atelectasis or infiltrate in lower lobes.  Upper lungs clear.  Small LEFT pleural effusion.  No pneumothorax.  IMPRESSION: Enlargement of cardiac silhouette post LVAD.  Bibasilar atelectasis versus infiltrate with increased infiltrate at RIGHT base.   Electronically Signed   By: Lavonia Dana M.D.   On: 10/15/2014 08:25   Dg Chest Port 1 View  10/14/2014   CLINICAL DATA:  Left ventricular assist device present. Difficulty breathing  EXAM: PORTABLE CHEST - 1 VIEW  COMPARISON:  October 13, 2014  FINDINGS: Central catheter tip is at the cavoatrial junction. No pneumothorax. There is persistent airspace consolidation the right lower lobe lateral. There is also patchy infiltrate in the left base and medial right base, stable. Heart is enlarged with left ventricular assist device present. There are chest tubes on the left side, stable.  IMPRESSION: Tube and catheter positions unchanged without pneumothorax. Left ventricular assist device present. Bibasilar consolidation as well as a focal area of infiltrate/ consolidation in the lateral right base. No new opacity. No change in cardiac silhouette.   Electronically Signed   By: Lowella Grip III M.D.   On: 10/14/2014 08:07     Medications:     Scheduled Medications: . acetaminophen (TYLENOL) oral liquid 160 mg/5 mL  650 mg Per Tube Once   Or  . acetaminophen  650 mg Rectal Once  . antiseptic oral rinse  7 mL Mouth Rinse q12n4p  . budesonide-formoterol  2 puff Inhalation BID  . chlorhexidine  15 mL Mouth Rinse BID  . citalopram  20 mg Oral Daily  . dextromethorphan  30 mg Oral BID  . furosemide  40 mg Oral Daily  . guaiFENesin  15 mL Oral 4 times per day  . insulin aspart  0-15 Units Subcutaneous TID WC  . insulin aspart  0-5 Units Subcutaneous QHS  . pantoprazole  40 mg Oral QHS  . potassium chloride  10 mEq Intravenous Q1 Hr x 3  . sodium chloride  10 mL Intravenous Q12H  .  traZODone  50 mg Oral QHS  . warfarin  2.5 mg Oral q1800  . Warfarin - Physician Dosing Inpatient   Does not apply q1800    Infusions: . lactated ringers 20 mL/hr at 10/15/14 0400    PRN Medications: oxyCODONE **AND** acetaminophen, fentaNYL (SUBLIMAZE) injection, levalbuterol, ondansetron (ZOFRAN) IV, RESOURCE THICKENUP CLEAR, sodium chloride, traMADol   Assessment:    1.  A/C systolic HF class IV with cardiogenic shock- EF 15% with moderate RV dysfunction 3/16 echo.         -S/P HMII LVAD 09/21/14  2. CAD s/p Anterior STEMI on 06/12/14 with stenting of LAD- was on coumadin + plavix, statin.  Now holding coumadin, off Plavix.  3. Acute on chronic hypercarbic respiratory failure  4. 06/12/14 VT in setting of STEMI 5. Ankylosing spondylitis. 6. Bilateral PE with R DVT- S/P IVC filter 08/27/2014--->coumadin 7. Acute Blood Loss into pleural space:  Back to OR 4/5 and 4/6 for recurrent bleed. Received multiple blood products.  8. PAF: On amiodarone.  Plan/Discussion:   S/P 09/21/2014 HMII LVAD.   Back to OR 4/5 and and again 4/6 for recurrent bleed and evacuation clots. Received multiple blood products. Hemoglobin 8.8 . Per Dr Darcey Nora if < 8 will transfuse.   Continue nightly bipap.  On 4 liters [place]. Having difficulty sleeping. Add home dose trazodone.   CO-OX 87% off milrinone. Hold lasix today and start lasix 40 mg po daily on Friday.    H/o VT and atrial fibrillation, no further atrial fibrillation.  On amiodarone gtt.  Supp K . Check Mag tomorrow.   On coumadin. INR 1.5. Goal INR 1.5-2.   Will not restart Plavix as anterior wall completely necrotic.   LVAD parameters stable.    I reviewed the LVAD parameters from today, and compared the results to the patient's prior recorded data.  No programming changes were made.  The LVAD is functioning within specified parameters.  The patient performs LVAD self-test daily.  LVAD interrogation was negative for any significant power  changes, alarms or PI events/speed drops.  LVAD equipment check completed and is in good working order.  Back-up equipment present.   LVAD education done on emergency procedures and precautions and reviewed exit site care.   Length of Stay: [place]  NP-C  10/15/2014, 10:24 AM  VAD Team --- VAD ISSUES ONLY--- Pager [phone] (7am - 7am)  Advanced Heart Failure Team  Pager [phone] (M-F; 7a - 4p)  Please contact [facility] for night-coverage after hours (4p -7a ) and weekends on [email or web address]   Patient seen and examined with Darrick Grinder, NP. We discussed all aspects of the encounter. I agree with the assessment and plan as stated above.   Stable off milrinone. Volume status ok. Hgb stable. Ambulating. INR coming up slowly. VAD parameters ok. Will continue current regimen. Pull Foley.   Management of chest tubes per TCTS,   Virgel Bensimhon,MD 10:24 AM

## 2014-10-15 NOTE — Progress Notes (Signed)
Physical Therapy Treatment Patient Details Name: Scott [name] MRN: [id number] DOB: 11/23/1945 Today's Date: 10/15/2014    History of Present Illness 69 y.o. M with extensive cardiac hx as well as recent PE / DVT, admitted for SOB due to heart failure, cardiogenic, shock, PE. Evaluated by VAD team. Found to have mod - large R pleural effusion. LVAD implantation 09/22/14. Progressed well until developed hemothorax and required return to OR 4/5 and 4/6 for Redo sternotomy, reexploration of chest and removal of hematoma.    PT Comments    Pt's affect remains flat to irritated (especially when attempting to encourage him to increase his ambulation distance). Overall tolerating ambulation better and today was able to manipulate transfer to/from battery power himself (except for managing vest and placing batteries in vest).   Follow Up Recommendations  Home health PT;Supervision/Assistance - 24 hour     Equipment Recommendations  Other (comment) (2 wheeled RW with fold down seat)    Recommendations for Other Services       Precautions / Restrictions Precautions Precautions: Sternal;Fall Precaution Comments: LVAD    Mobility  Bed Mobility Overal bed mobility: Needs Assistance Bed Mobility: Supine to Sit     Supine to sit: Mod assist;HOB elevated     General bed mobility comments: HOB 67, educated to use head/neck flexion and lifting Rt shoulder off mattress using his abdominals  Transfers Overall transfer level: Needs assistance Equipment used: Rolling walker (2 wheeled) Transfers: Sit to/from Stand Sit to Stand: +2 safety/equipment;Min assist;+2 physical assistance         General transfer comment: from ICU bed with seat deflated; contiues to need assist initiating hip/knee extension (he gets almost too much anterior weight shift   Ambulation/Gait Ambulation/Gait assistance: Min guard;+2 safety/equipment Ambulation Distance (Feet): 150 Feet Assistive device:  Rolling walker (2 wheeled) Gait Pattern/deviations: Step-through pattern;Decreased stride length;Trunk flexed   Gait velocity interpretation: Below normal speed for age/gender General Gait Details: on RA with only once standing rest break for ~10 seconds; velocity improved; attempted to get pt to walk farther, (offered to bring chair to him) and he refused   Stairs            Wheelchair Mobility    Modified Rankin (Stroke Patients Only)       Balance     Sitting balance-Leahy Scale: Fair       Standing balance-Leahy Scale: Poor                      Cognition Arousal/Alertness: Awake/alert Behavior During Therapy: Flat affect Overall Cognitive Status: Within Functional Limits for tasks assessed                      Exercises Other Exercises Other Exercises: Pt with 3/5 Lt dorsiflexion today. No foot drop during ambulation    General Comments General comments (skin integrity, edema, etc.): Pt able to independently switch LVAD to/from battery power. Required some assistance with donning vest/placing batteries in pockets.      Pertinent Vitals/Pain Pain Assessment: No/denies pain    Home Living                      Prior Function            PT Goals (current goals can now be found in the care plan section) Acute Rehab PT Goals Patient Stated Goal: to go home  Time For Goal Achievement: 10/25/14 Progress towards PT goals: Progressing toward  goals    Frequency  Min 3X/week    PT Plan Current plan remains appropriate    Co-evaluation PT/OT/SLP Co-Evaluation/Treatment: Yes Reason for Co-Treatment: Complexity of the patient's impairments (multi-system involvement);For patient/therapist safety (multiple lines) PT goals addressed during session: Mobility/safety with mobility;Proper use of DME       End of Session   Activity Tolerance: Patient limited by fatigue Patient left: in chair;with call bell/phone within reach;with  family/visitor present;with chair alarm set     Time: 1302-1330 PT Time Calculation (min) (ACUTE ONLY): 28 min  Charges:  $Gait Training: 8-22 mins                    G Codes:      Kately Graffam Nov 05, 2014, 1:44 PM Pager [phone]

## 2014-10-15 NOTE — Evaluation (Signed)
Occupational Therapy Evaluation Patient Details Name: Scott [name] MRN: [id number] DOB: 1946/03/20 Today's Date: 10/15/2014    History of Present Illness 69 y.o. M with extensive cardiac hx as well as recent PE / DVT, admitted for SOB due to heart failure, cardiogenic, shock, PE. Evaluated by VAD team. Found to have mod - large R pleural effusion. LVAD implantation 09/22/14. Progressed well until developed hemothorax and required return to OR 4/5 and 4/6 for Redo sternotomy, reexploration of chest and removal of hematoma.   Clinical Impression   Pt demonstrates improving endurance.  Encouraged him to begin performing BADLs  Over the weeknd as opportunities to increase activity tolerance.     Follow Up Recommendations  Home health OT    Equipment Recommendations  3 in 1 bedside comode;Tub/shower seat    Recommendations for Other Services       Precautions / Restrictions Precautions Precautions: Sternal;Fall Precaution Comments: LVAD      Mobility Bed Mobility Overal bed mobility: Needs Assistance Bed Mobility: Supine to Sit     Supine to sit: Mod assist;HOB elevated Sit to supine: Mod assist;+2 for safety/equipment   General bed mobility comments: HOB 47, educated to use head/neck flexion and lifting Rt shoulder off mattress using his abdominals  Transfers Overall transfer level: Needs assistance Equipment used: Rolling walker (2 wheeled) Transfers: Sit to/from [employer] Transfers Sit to Stand: +2 safety/equipment;Min assist;+2 physical assistance         General transfer comment: from ICU bed with seat deflated; contiues to need assist initiating hip/knee extension (he gets almost too much anterior weight shift     Balance Overall balance assessment: Needs assistance Sitting-balance support: No upper extremity supported Sitting balance-Leahy Scale: Fair     Standing balance support: Bilateral upper extremity supported Standing balance-Leahy  Scale: Poor                              ADL   Eating/Feeding: Set up;Sitting                       Toilet Transfer: Minimal assistance;+2 for safety/equipment;Ambulation;RW   Toileting- Clothing Manipulation and Hygiene: Maximal assistance;Sit to/from stand       Functional mobility during ADLs: Minimal assistance;+2 for safety/equipment General ADL Comments: Pt with improving endurance.  Encouraged pt to begin doing some of this bathing over the weekend to increase his endurance      Vision     Perception     Praxis      Pertinent Vitals/Pain Pain Assessment: No/denies pain     Hand Dominance     Extremity/Trunk Assessment             Communication     Cognition Arousal/Alertness: Awake/alert Behavior During Therapy: Flat affect Overall Cognitive Status: Within Functional Limits for tasks assessed                     General Comments       Exercises   Other Exercises Other Exercises: Pt with 2+ Lt dorsiflexion today. No foot drop during ambulation (Simultaneous filing. User may not have seen previous data.)   Shoulder Instructions      Home Living  Prior Functioning/Environment               OT Diagnosis:     OT Problem List:     OT Treatment/Interventions:      OT Goals(Current goals can be found in the care plan section) Acute Rehab OT Goals Patient Stated Goal: to go home  OT Goal Formulation: With patient Time For Goal Achievement: 10/26/14 Potential to Achieve Goals: Good  OT Frequency: Min 2X/week   Barriers to D/C:            Co-evaluation PT/OT/SLP Co-Evaluation/Treatment: Yes Reason for Co-Treatment: Complexity of the patient's impairments (multi-system involvement) PT goals addressed during session: Mobility/safety with mobility;Proper use of DME OT goals addressed during session: Strengthening/ROM;ADL's and self-care      End of  Session Equipment Utilized During Treatment: Oxygen Nurse Communication: Mobility status  Activity Tolerance: Patient limited by fatigue Patient left: in chair;with call bell/phone within reach;with chair alarm set;with family/visitor present   Time: 4268-3419 OT Time Calculation (min): 31 min Charges:  OT General Charges $OT Visit: 1 Procedure OT Treatments $Therapeutic Activity: 8-22 mins G-Codes:    Laurelyn Terrero M 2014/11/01, 1:48 PM

## 2014-10-15 NOTE — Progress Notes (Signed)
Patient ID: Scott [name], male   DOB: 06-04-1946, 69 y.o.   MRN: [id number]  HeartMate 2 Rounding Note  Subjective:    Feels ok. Ambulating. Foley out but has not voided yet  Co-ox 87%  Left pleural tube 230 cc/24hrs serous Pocket drain 120 cc/24 hrs serosanguinous  LVAD INTERROGATION:  HeartMate II LVAD:  Flow 5.2 liters/min, speed 9400, power 6, PI 6.   No PI events.  Objective:    Vital Signs:   Temp:  [96.7 F (35.9 C)-97.8 F (36.6 C)] 97.6 F (36.4 C) (04/15 1200) Pulse Rate:  [64-99] 98 (04/15 1200) Resp:  [16-34] 24 (04/15 1200) SpO2:  [91 %-100 %] 94 % (04/15 1200) Weight:  [82.3 kg (181 lb 7 oz)] 82.3 kg (181 lb 7 oz) (04/15 0500) Last BM Date: 10/15/14 Mean arterial Pressure 78-90  Intake/Output:   Intake/Output Summary (Last 24 hours) at 10/15/14 1459 Last data filed at 10/15/14 1400  Gross per 24 hour  Intake   1100 ml  Output   1480 ml  Net   -380 ml     Physical Exam: General:  Well appearing. No resp difficulty, hoarse voice HEENT: normal Neck: supple. JVP . Carotids 2+ bilat; no bruits. No lymphadenopathy or thryomegaly appreciated. Cor: Normal heart sounds with LVAD hum present. Chest incision ok Lungs: clear Abdomen: soft, nontender, nondistended. No hepatosplenomegaly. No bruits or masses. Good bowel sounds. Extremities: no cyanosis, clubbing, rash, edema Neuro: alert & orientedx3, cranial nerves grossly intact. moves all 4 extremities w/o difficulty. Affect pleasant  Telemetry: sinus 98  Labs: Basic Metabolic Panel:  Recent Labs Lab 10/09/14 0625  10/10/14 0400  10/11/14 0503  10/12/14 0420 10/12/14 2227 10/13/14 0340 10/13/14 1615 10/14/14 0400 10/15/14 0450  NA  --   < >  --   < > 136  < > 138 122* 138 139 142 144  K  --   < >  --   < > 3.4*  < > 3.3* 3.0* 4.0 3.6 3.7 3.7  CL  --   < >  --   < > 94*  < > 94* 91* 94* 93* 97 102  CO2  --   < >  --   < > 43*  --  38*  --  36*  --  38* 34*  GLUCOSE  --   < >  --   < > 90   < > 127* 556* 87 101* 102* 101*  BUN  --   < >  --   < > 8  < > 14 14 15 16 13 10   CREATININE  --   < >  --   < > 0.58  < > 0.81 0.90 0.85 0.90 0.78 0.76  CALCIUM  --   < >  --   < > 7.6*  --  8.0*  --  8.3*  --  8.6 8.5  MG 1.6  --  1.6  --  1.8  --   --   --   --   --   --  2.1  < > = values in this interval not displayed.  Liver Function Tests:  Recent Labs Lab 10/11/14 0503 10/12/14 0420 10/13/14 0340 10/14/14 0400 10/15/14 0450  AST 29 37 30 33 34  ALT 21 23 24 24 25   ALKPHOS 196* 244* 254* 275* 284*  BILITOT 1.5* 1.3* 1.5* 1.4* 1.3*  PROT 5.0* 4.9* 5.7* 5.1* 5.5*  ALBUMIN 1.9* 1.8* 1.9* 2.0* 2.0*  No results for input(s): LIPASE, AMYLASE in the last 168 hours. No results for input(s): AMMONIA in the last 168 hours.  CBC:  Recent Labs Lab 10/12/14 1400  10/13/14 0805 10/13/14 0840 10/13/14 1615 10/14/14 0400 10/15/14 0450  WBC 14.9*  --  10.3 12.7*  --  11.7* 12.9*  HGB 9.1*  < > 6.6* 8.9* 10.5* 8.8* 8.8*  HCT 29.9*  < > 22.1* 29.9* 31.0* 29.3* 29.1*  MCV 97.4  --  99.1 98.0  --  98.3 97.0  PLT 446*  --  343 482*  --  502* 548*  < > = values in this interval not displayed.  INR:  Recent Labs Lab 10/11/14 0503 10/12/14 0420 10/13/14 0340 10/14/14 0400 10/15/14 0450  INR 1.21 1.37 1.41 1.51* 1.49   LDH 394, up from 340 yesterday  Other results:  EKG:   Imaging: Dg Chest Port 1 View  10/15/2014   CLINICAL DATA:  LEFT ventricular assist device  EXAM: PORTABLE CHEST - 1 VIEW  COMPARISON:  Portable exam 0723 hours compared to 10/14/2014  FINDINGS: LEFT ventricular assist device projects over the inferior aspect of cardiac silhouette LEFT upper quadrant.  RIGHT arm PICC line tip projects over cavoatrial junction.  Enlargement of cardiac silhouette post median sternotomy.  Mediastinal contours and pulmonary vascularity normal.  Slightly increased consolidation at the lateral RIGHT middle lobe.  Minimal bibasilar atelectasis or infiltrate in lower lobes.   Upper lungs clear.  Small LEFT pleural effusion.  No pneumothorax.  IMPRESSION: Enlargement of cardiac silhouette post LVAD.  Bibasilar atelectasis versus infiltrate with increased infiltrate at RIGHT base.   Electronically Signed   By: Lavonia Dana M.D.   On: 10/15/2014 08:25   Dg Chest Port 1 View  10/14/2014   CLINICAL DATA:  Left ventricular assist device present. Difficulty breathing  EXAM: PORTABLE CHEST - 1 VIEW  COMPARISON:  October 13, 2014  FINDINGS: Central catheter tip is at the cavoatrial junction. No pneumothorax. There is persistent airspace consolidation the right lower lobe lateral. There is also patchy infiltrate in the left base and medial right base, stable. Heart is enlarged with left ventricular assist device present. There are chest tubes on the left side, stable.  IMPRESSION: Tube and catheter positions unchanged without pneumothorax. Left ventricular assist device present. Bibasilar consolidation as well as a focal area of infiltrate/ consolidation in the lateral right base. No new opacity. No change in cardiac silhouette.   Electronically Signed   By: Lowella Grip III M.D.   On: 10/14/2014 08:07      Medications:     Scheduled Medications: . acetaminophen (TYLENOL) oral liquid 160 mg/5 mL  650 mg Per Tube Once   Or  . acetaminophen  650 mg Rectal Once  . antiseptic oral rinse  7 mL Mouth Rinse q12n4p  . budesonide-formoterol  2 puff Inhalation BID  . chlorhexidine  15 mL Mouth Rinse BID  . citalopram  20 mg Oral Daily  . dextromethorphan  30 mg Oral BID  . furosemide  40 mg Oral Daily  . guaiFENesin  15 mL Oral 4 times per day  . insulin aspart  0-15 Units Subcutaneous TID WC  . insulin aspart  0-5 Units Subcutaneous QHS  . pantoprazole  40 mg Oral QHS  . sodium chloride  10 mL Intravenous Q12H  . traZODone  50 mg Oral QHS  . warfarin  2.5 mg Oral q1800  . Warfarin - Physician Dosing Inpatient   Does not apply [phone]  Infusions: . lactated ringers 20  mL/hr at 10/15/14 0400     PRN Medications:  oxyCODONE **AND** acetaminophen, fentaNYL (SUBLIMAZE) injection, levalbuterol, ondansetron (ZOFRAN) IV, RESOURCE THICKENUP CLEAR, sodium chloride, traMADol   Assessment:   POD 23 LVAD POD 8 exploration for bleeding  Hemodynamically stable  Doing well from respiratory standpoint and using Bipap at hs.  Mobilizing well  Tolerating dysphagia diet.  Chest tube output still a little high to remove.  INR 1.5. No change from yesterday. Going slowly.  Plan/Discussion:    Continue current plans.   Keep chest tubes in until < 100 cc per day.  I think he can go out to 2W stepdown.  I reviewed the LVAD parameters from today, and compared the results to the patient's prior recorded data.  No programming changes were made.  The LVAD is functioning within specified parameters.  The patient performs LVAD self-test daily.  LVAD interrogation was negative for any significant power changes, alarms or PI events/speed drops.  LVAD equipment check completed and is in good working order.  Back-up equipment present.   LVAD education done on emergency procedures and precautions and reviewed exit site care.  Length of Stay: [address]  Fernande Boyden [facility] 10/15/2014, 2:59 PM

## 2014-10-16 LAB — COMPREHENSIVE METABOLIC PANEL
ALT: 30 U/L (ref 0–53)
AST: 35 U/L (ref 0–37)
Albumin: 2.1 g/dL — ABNORMAL LOW (ref 3.5–5.2)
Alkaline Phosphatase: 287 U/L — ABNORMAL HIGH (ref 39–117)
Anion gap: 10 (ref 5–15)
BUN: 12 mg/dL (ref 6–23)
CO2: 32 mmol/L (ref 19–32)
Calcium: 8.8 mg/dL (ref 8.4–10.5)
Chloride: 105 mmol/L (ref 96–112)
Creatinine, Ser: 0.82 mg/dL (ref 0.50–1.35)
GFR calc Af Amer: >90 mL/min (ref >90)
GFR calc non Af Amer: >90 mL/min (ref >90)
Glucose, Bld: 107 mg/dL — ABNORMAL HIGH (ref 70–99)
Potassium: 3.6 mmol/L (ref 3.5–5.1)
Sodium: 147 mmol/L — ABNORMAL HIGH (ref 135–145)
Total Bilirubin: 1 mg/dL (ref 0.3–1.2)
Total Protein: 5.6 g/dL — ABNORMAL LOW (ref 6.0–8.3)

## 2014-10-16 LAB — CBC
HCT: 29.8 % — ABNORMAL LOW (ref 39.0–52.0)
Hemoglobin: 9.1 g/dL — ABNORMAL LOW (ref 13.0–17.0)
MCH: 29.6 pg (ref 26.0–34.0)
MCHC: 30.5 g/dL (ref 30.0–36.0)
MCV: 97.1 fL (ref 78.0–100.0)
Platelets: 570 10*3/uL — ABNORMAL HIGH (ref 150–400)
RBC: 3.07 MIL/uL — ABNORMAL LOW (ref 4.22–5.81)
RDW: 18.1 % — ABNORMAL HIGH (ref 11.5–15.5)
WBC: 16.8 10*3/uL — ABNORMAL HIGH (ref 4.0–10.5)

## 2014-10-16 LAB — GLUCOSE, CAPILLARY
GLUCOSE-CAPILLARY: 99 mg/dL (ref 70–99)
Glucose-Capillary: 139 mg/dL — ABNORMAL HIGH (ref 70–99)
Glucose-Capillary: 116 mg/dL — ABNORMAL HIGH (ref 70–99)

## 2014-10-16 LAB — PROTIME-INR
INR: 1.45 (ref 0.00–1.49)
PROTHROMBIN TIME: 17.7 s — AB (ref 11.6–15.2)

## 2014-10-16 LAB — LACTATE DEHYDROGENASE: LDH: 398 U/L — ABNORMAL HIGH (ref 94–250)

## 2014-10-16 MED ORDER — POTASSIUM CHLORIDE 10 MEQ/50ML IV SOLN
10.0000 meq | INTRAVENOUS | Status: AC | PRN
  Administered 2014-10-16 (×3): 10 meq via INTRAVENOUS
  Filled 2014-10-16: qty 50

## 2014-10-16 MED ORDER — WARFARIN SODIUM 4 MG PO TABS
4.0000 mg | ORAL_TABLET | Freq: Once | ORAL | Status: AC
  Administered 2014-10-16: 4 mg via ORAL
  Filled 2014-10-16: qty 1

## 2014-10-16 NOTE — Progress Notes (Signed)
Patient ID: Scott [name], male   DOB: 12-17-1945, 69 y.o.   MRN: [id number] HeartMate 2 Rounding Note  Subjective:    S/P HMII LVAD on 09/20/2013.   Moved to ICU  4/5 due to low MAP and low hemoglobin. Taken Back to the OR 4/5 and 4/6  for recurrent bleed and evacuation of clots from left chest.   Doing well Off milrinone. Hgb stable.   Weight down 2 pounds. Ambulated with PT. Foley out - urinating well. Chest tubes x 2 still draining some. Got one dose hydralazine last night for MAP 102  Denies SOB.   LVAD INTERROGATION:  HeartMate II LVAD:  Flow 4.9  liters/min, speed 9400, power 6 , PI 6.1 No PI events.    Objective:    Vital Signs:   Temp:  [96.6 F (35.9 C)-98.3 F (36.8 C)] 96.6 F (35.9 C) (04/16 0700) Pulse Rate:  [86-124] 124 (04/16 0900) Resp:  [19-33] 19 (04/16 0900) SpO2:  [91 %-100 %] 92 % (04/16 0827) Weight:  [81.6 kg (179 lb 14.3 oz)] 81.6 kg (179 lb 14.3 oz) (04/16 0500) Last BM Date: 10/15/14 Mean arterial Pressure  74-92  Intake/Output:   Intake/Output Summary (Last 24 hours) at 10/16/14 0953 Last data filed at 10/16/14 0900  Gross per 24 hour  Intake    870 ml  Output   1455 ml  Net   -585 ml     Physical Exam: CVP 3 General: Sitting in chair  HEENT: normal Neck: supple. Carotids 2+ bilat; no bruits. No lymphadenopathy or thryomegaly appreciated. R neck Swan  Cor: Mechanical heart sounds with LVAD hum present. Lungs: RML RLL LLL crackles. On 4 liters [place].  Abdomen: soft, nontender, nondistended. No hepatosplenomegaly. No bruits or masses. Good bowel sounds. Driveline: Dressing looks good.  Extremities: no cyanosis, clubbing, rash.  Trace edema and R and LLE compression stockings Neuro: Awake alert GU: foley   Telemetry: NSR 70-80s   Labs: Basic Metabolic Panel:  Recent Labs Lab 10/10/14 0400  10/11/14 0503  10/12/14 0420  10/13/14 0340 10/13/14 1615 10/14/14 0400 10/15/14 0450 10/16/14 0330  NA  --   < > 136  < > 138  < > 138  139 142 144 147*  K  --   < > 3.4*  < > 3.3*  < > 4.0 3.6 3.7 3.7 3.6  CL  --   < > 94*  < > 94*  < > 94* 93* 97 102 105  CO2  --   < > 43*  --  38*  --  36*  --  38* 34* 32  GLUCOSE  --   < > 90  < > 127*  < > 87 101* 102* 101* 107*  BUN  --   < > 8  < > 14  < > 15 16 13 10 12   CREATININE  --   < > 0.58  < > 0.81  < > 0.85 0.90 0.78 0.76 0.82  CALCIUM  --   < > 7.6*  --  8.0*  --  8.3*  --  8.6 8.5 8.8  MG 1.6  --  1.8  --   --   --   --   --   --  2.1  --   < > = values in this interval not displayed.  Liver Function Tests:  Recent Labs Lab 10/12/14 0420 10/13/14 0340 10/14/14 0400 10/15/14 0450 10/16/14 0330  AST 37 30 33 34 35  ALT 23 24 24 25 30   ALKPHOS 244* 254* 275* 284* 287*  BILITOT 1.3* 1.5* 1.4* 1.3* 1.0  PROT 4.9* 5.7* 5.1* 5.5* 5.6*  ALBUMIN 1.8* 1.9* 2.0* 2.0* 2.1*   No results for input(s): LIPASE, AMYLASE in the last 168 hours. No results for input(s): AMMONIA in the last 168 hours.  CBC:  Recent Labs Lab 10/13/14 0805 10/13/14 0840 10/13/14 1615 10/14/14 0400 10/15/14 0450 10/16/14 0330  WBC 10.3 12.7*  --  11.7* 12.9* 16.8*  HGB 6.6* 8.9* 10.5* 8.8* 8.8* 9.1*  HCT 22.1* 29.9* 31.0* 29.3* 29.1* 29.8*  MCV 99.1 98.0  --  98.3 97.0 97.1  PLT 343 482*  --  502* 548* 570*    INR:  Recent Labs Lab 10/12/14 0420 10/13/14 0340 10/14/14 0400 10/15/14 0450 10/16/14 0330  INR 1.37 1.41 1.51* 1.49 1.45    Other results:    Imaging: Dg Chest Port 1 View  10/15/2014   CLINICAL DATA:  LEFT ventricular assist device  EXAM: PORTABLE CHEST - 1 VIEW  COMPARISON:  Portable exam 0723 hours compared to 10/14/2014  FINDINGS: LEFT ventricular assist device projects over the inferior aspect of cardiac silhouette LEFT upper quadrant.  RIGHT arm PICC line tip projects over cavoatrial junction.  Enlargement of cardiac silhouette post median sternotomy.  Mediastinal contours and pulmonary vascularity normal.  Slightly increased consolidation at the lateral RIGHT  middle lobe.  Minimal bibasilar atelectasis or infiltrate in lower lobes.  Upper lungs clear.  Small LEFT pleural effusion.  No pneumothorax.  IMPRESSION: Enlargement of cardiac silhouette post LVAD.  Bibasilar atelectasis versus infiltrate with increased infiltrate at RIGHT base.   Electronically Signed   By: Lavonia Dana M.D.   On: 10/15/2014 08:25     Medications:     Scheduled Medications: . acetaminophen (TYLENOL) oral liquid 160 mg/5 mL  650 mg Per Tube Once   Or  . acetaminophen  650 mg Rectal Once  . antiseptic oral rinse  7 mL Mouth Rinse q12n4p  . budesonide-formoterol  2 puff Inhalation BID  . chlorhexidine  15 mL Mouth Rinse BID  . citalopram  20 mg Oral Daily  . dextromethorphan  30 mg Oral BID  . furosemide  40 mg Oral Daily  . guaiFENesin  15 mL Oral 4 times per day  . insulin aspart  0-15 Units Subcutaneous TID WC  . insulin aspart  0-5 Units Subcutaneous QHS  . pantoprazole  40 mg Oral QHS  . sodium chloride  10 mL Intravenous Q12H  . traZODone  50 mg Oral QHS  . warfarin  2.5 mg Oral q1800  . Warfarin - Physician Dosing Inpatient   Does not apply q1800    Infusions: . lactated ringers 20 mL/hr at 10/16/14 0400    PRN Medications: oxyCODONE **AND** acetaminophen, fentaNYL (SUBLIMAZE) injection, hydrALAZINE, levalbuterol, ondansetron (ZOFRAN) IV, potassium chloride, RESOURCE THICKENUP CLEAR, sodium chloride, traMADol   Assessment:    1.  A/C systolic HF class IV with cardiogenic shock- EF 15% with moderate RV dysfunction 3/16 echo.         -S/P HMII LVAD 09/21/14  2. CAD s/p Anterior STEMI on 06/12/14 with stenting of LAD- was on coumadin + plavix, statin.  Now holding coumadin, off Plavix.  3. Acute on chronic hypercarbic respiratory failure  4. 06/12/14 VT in setting of STEMI 5. Ankylosing spondylitis. 6. Bilateral PE with R DVT- S/P IVC filter 08/27/2014--->coumadin 7. Acute Blood Loss into pleural space:  Back to OR 4/5 and 4/6  for recurrent bleed.  Received multiple blood products.  8. PAF: On amiodarone.  Plan/Discussion:   S/P 09/21/2014 HMII LVAD.   Back to OR 4/5 and and again 4/6 for recurrent bleed and evacuation clots. Received multiple blood products. Hemoglobin trending up.   Continues to progress. MAP up last night but better today.  On coumadin. INR 1.5 -> 1.45. Goal INR 1.5-2. Will ask pharmacy to help with dosing.  Will not restart Plavix as anterior wall completely necrotic.   Will stop trazodone given problems with CO2 retention at night.   Management of chest tubes per TCT. LVAD parameters stable.    I reviewed the LVAD parameters from today, and compared the results to the patient's prior recorded data.  No programming changes were made.  The LVAD is functioning within specified parameters.  The patient performs LVAD self-test daily.  LVAD interrogation was negative for any significant power changes, alarms or PI events/speed drops.  LVAD equipment check completed and is in good working order.  Back-up equipment present.   LVAD education done on emergency procedures and precautions and reviewed exit site care.   Benay Spice 9:53 AM Length of Stay: 63  VAD Team --- VAD ISSUES ONLY--- Pager [phone] (7am - 7am)  Advanced Heart Failure Team  Pager [phone] (M-F; 7a - 4p)  Please contact [facility] for night-coverage after hours (4p -7a ) and weekends on [email or web address]

## 2014-10-17 ENCOUNTER — Inpatient Hospital Stay (HOSPITAL_COMMUNITY): Payer: Medicare Other

## 2014-10-17 LAB — URINALYSIS, ROUTINE W REFLEX MICROSCOPIC
BILIRUBIN URINE: NEGATIVE
Glucose, UA: NEGATIVE mg/dL
KETONES UR: NEGATIVE mg/dL
Leukocytes, UA: NEGATIVE
Nitrite: NEGATIVE
PH: 7.5 (ref 5.0–8.0)
PROTEIN: NEGATIVE mg/dL
Specific Gravity, Urine: 1.013 (ref 1.005–1.030)
Urobilinogen, UA: 0.2 mg/dL (ref 0.0–1.0)

## 2014-10-17 LAB — COMPREHENSIVE METABOLIC PANEL
ALT: 29 U/L (ref 0–53)
AST: 36 U/L (ref 0–37)
Albumin: 2.1 g/dL — ABNORMAL LOW (ref 3.5–5.2)
Alkaline Phosphatase: 291 U/L — ABNORMAL HIGH (ref 39–117)
Anion gap: 7 (ref 5–15)
BUN: 12 mg/dL (ref 6–23)
CO2: 33 mmol/L — ABNORMAL HIGH (ref 19–32)
Calcium: 8.7 mg/dL (ref 8.4–10.5)
Chloride: 108 mmol/L (ref 96–112)
Creatinine, Ser: 0.81 mg/dL (ref 0.50–1.35)
GFR calc Af Amer: >90 mL/min (ref >90)
GFR calc non Af Amer: >90 mL/min (ref >90)
Glucose, Bld: 114 mg/dL — ABNORMAL HIGH (ref 70–99)
Potassium: 3.7 mmol/L (ref 3.5–5.1)
Sodium: 148 mmol/L — ABNORMAL HIGH (ref 135–145)
Total Bilirubin: 1 mg/dL (ref 0.3–1.2)
Total Protein: 5.8 g/dL — ABNORMAL LOW (ref 6.0–8.3)

## 2014-10-17 LAB — URINE MICROSCOPIC-ADD ON

## 2014-10-17 LAB — GLUCOSE, CAPILLARY
GLUCOSE-CAPILLARY: 108 mg/dL — AB (ref 70–99)
Glucose-Capillary: 115 mg/dL — ABNORMAL HIGH (ref 70–99)
Glucose-Capillary: 109 mg/dL — ABNORMAL HIGH (ref 70–99)
Glucose-Capillary: 113 mg/dL — ABNORMAL HIGH (ref 70–99)
Glucose-Capillary: 118 mg/dL — ABNORMAL HIGH (ref 70–99)

## 2014-10-17 LAB — CBC
HCT: 31.4 % — ABNORMAL LOW (ref 39.0–52.0)
Hemoglobin: 9.3 g/dL — ABNORMAL LOW (ref 13.0–17.0)
MCH: 29.2 pg (ref 26.0–34.0)
MCHC: 29.6 g/dL — ABNORMAL LOW (ref 30.0–36.0)
MCV: 98.4 fL (ref 78.0–100.0)
Platelets: 594 10*3/uL — ABNORMAL HIGH (ref 150–400)
RBC: 3.19 MIL/uL — ABNORMAL LOW (ref 4.22–5.81)
RDW: 18.8 % — ABNORMAL HIGH (ref 11.5–15.5)
WBC: 20.6 10*3/uL — ABNORMAL HIGH (ref 4.0–10.5)

## 2014-10-17 LAB — LACTATE DEHYDROGENASE: LDH: 420 U/L — AB (ref 94–250)

## 2014-10-17 LAB — PROTIME-INR
INR: 1.48 (ref 0.00–1.49)
Prothrombin Time: 18.1 seconds — ABNORMAL HIGH (ref 11.6–15.2)

## 2014-10-17 MED ORDER — WARFARIN - PHARMACIST DOSING INPATIENT
Freq: Every day | Status: DC
  Administered 2014-10-18: 18:00:00

## 2014-10-17 MED ORDER — WARFARIN SODIUM 4 MG PO TABS
4.0000 mg | ORAL_TABLET | Freq: Once | ORAL | Status: AC
  Administered 2014-10-17: 4 mg via ORAL
  Filled 2014-10-17: qty 1

## 2014-10-17 MED ORDER — SODIUM CHLORIDE 0.9 % IV BOLUS (SEPSIS)
500.0000 mL | Freq: Once | INTRAVENOUS | Status: AC
  Administered 2014-10-17: 500 mL via INTRAVENOUS

## 2014-10-17 MED ORDER — SODIUM CHLORIDE 0.9 % IV SOLN
1.0000 g | Freq: Three times a day (TID) | INTRAVENOUS | Status: DC
  Administered 2014-10-17 – 2014-10-20 (×9): 1 g via INTRAVENOUS
  Filled 2014-10-17 (×12): qty 1

## 2014-10-17 MED ORDER — POTASSIUM CHLORIDE 10 MEQ/50ML IV SOLN
10.0000 meq | INTRAVENOUS | Status: AC | PRN
  Administered 2014-10-17 (×3): 10 meq via INTRAVENOUS
  Filled 2014-10-17: qty 50

## 2014-10-17 MED ORDER — FLUCONAZOLE 100MG IVPB
100.0000 mg | INTRAVENOUS | Status: DC
  Administered 2014-10-17: 100 mg via INTRAVENOUS
  Filled 2014-10-17 (×2): qty 50

## 2014-10-17 NOTE — Progress Notes (Signed)
ANTIBIOTIC CONSULT NOTE - FOLLOW UP ANTICOAGULATION NOTE - FOLLOW UP  Pharmacy Consult for meropenem and Coumadin Indication: leukocytosis/loculated effusion, hx PE/DVT and LVAD  No Known Allergies  Patient Measurements: Height: 5\' 8"  (172.7 cm) Weight: 173 lb 1 oz (78.5 kg) IBW/kg (Calculated) : 68.4  Vital Signs: Temp: 97.4 F (36.3 C) (04/17 0700) Temp Source: Axillary (04/17 0700) Pulse Rate: 97 (04/17 1000) Intake/Output from previous day: 04/16 0701 - 04/17 0700 In: 1090 [P.O.:560; I.V.:480; IV Piggyback:50] Out: 1497 [Urine:1350; Chest Tube:400] Intake/Output from this shift: Total I/O In: 160 [I.V.:60; IV Piggyback:100] Out: -   Labs:  Recent Labs  10/15/14 0450 10/16/14 0330 10/17/14 0400  WBC 12.9* 16.8* 20.6*  HGB 8.8* 9.1* 9.3*  PLT 548* 570* 594*  CREATININE 0.76 0.82 0.81   Estimated Creatinine Clearance: 86.8 mL/min (by C-G formula based on Cr of 0.81).  Assessment: 68yom s/p LVAD 3/22, well known to pharmacy from previous antibiotic and anticoagulation dosing. He has been on multiple abx since here. WBC is trending up for the past few days. Vanc/zosyn were stopped on 4/14. Scr has remained stable. Team has ordered meropenem empirically now. Cards has also asked rx to help dose coumadin with a goal of 1.5-2 for a hx DVT. He has been on coumadin 2.5mg  for the past couple of days without much movement. MD ordered a dose of 4mg  yesterday and INR is basically the same. Going to repeat 4 mg again today. If no movement, we'll increase dose tomorrow.   Goal of Therapy:  Vancomycin trough level 15-20 mcg/ml INR = 1.5-2  Plan:   Merrem 1g IV q8 Coumadin 4mg  PO x1 Daily INR  Onnie Boer, PharmD Pager: [phone] 10/17/2014 11:02 AM

## 2014-10-17 NOTE — Progress Notes (Signed)
Patient ID: Scott [name], male   DOB: April 23, 1946, 69 y.o.   MRN: [id number] HeartMate 2 Rounding Note  Subjective:    S/P HMII LVAD on 09/20/2013.   Moved to ICU  4/5 due to low MAP and low hemoglobin. Taken Back to the OR 4/5 and 4/6  for recurrent bleed and evacuation of clots from left chest.   Doing well Off milrinone. Hgb stable. On oral lasix.  Weight down another 6 pounds. Ambulated entire unit this am.  Chest tubes x 2 still draining some but reduced. MAP 68-100. Now 74.  Denies SOB. No diarrhea. F/C.   LVAD INTERROGATION:  HeartMate II LVAD:  Flow 5.6  liters/min, speed 9400, power 6 , PI 5.1 1 PI event.    Objective:    Vital Signs:   Temp:  [96.7 F (35.9 C)-97.8 F (36.6 C)] 97.4 F (36.3 C) (04/17 0700) Pulse Rate:  [78-102] 97 (04/17 1000) Resp:  [15-36] 24 (04/17 1000) SpO2:  [93 %-100 %] 97 % (04/17 1000) FiO2 (%):  [40 %] 40 % (04/17 0400) Weight:  [78.5 kg (173 lb 1 oz)] 78.5 kg (173 lb 1 oz) (04/17 0700) Last BM Date: 10/16/14 Mean arterial Pressure  68-100  Intake/Output:   Intake/Output Summary (Last 24 hours) at 10/17/14 1030 Last data filed at 10/17/14 0900  Gross per 24 hour  Intake   1000 ml  Output   1525 ml  Net   -525 ml     Physical Exam:  General: Lying in bed HEENT: normal Neck: supple. Carotids 2+ bilat; no bruits. No lymphadenopathy or thryomegaly appreciated. R neck Swan  Cor: Mechanical heart sounds with LVAD hum present. Lungs: clear On 4 liters [place].  Abdomen: soft, nontender, nondistended. No hepatosplenomegaly. No bruits or masses. Good bowel sounds. Driveline: Dressing looks good.  Extremities: no cyanosis, clubbing, rash.  No edema Neuro: Awake alert   Telemetry: NSR 70-80s   Labs: Basic Metabolic Panel:  Recent Labs Lab 10/11/14 0503  10/13/14 0340 10/13/14 1615 10/14/14 0400 10/15/14 0450 10/16/14 0330 10/17/14 0400  NA 136  < > 138 139 142 144 147* 148*  K 3.4*  < > 4.0 3.6 3.7 3.7 3.6 3.7  CL 94*  < >  94* 93* 97 102 105 108  CO2 43*  < > 36*  --  38* 34* 32 33*  GLUCOSE 90  < > 87 101* 102* 101* 107* 114*  BUN 8  < > 15 16 13 10 12 12   CREATININE 0.58  < > 0.85 0.90 0.78 0.76 0.82 0.81  CALCIUM 7.6*  < > 8.3*  --  8.6 8.5 8.8 8.7  MG 1.8  --   --   --   --  2.1  --   --   < > = values in this interval not displayed.  Liver Function Tests:  Recent Labs Lab 10/13/14 0340 10/14/14 0400 10/15/14 0450 10/16/14 0330 10/17/14 0400  AST 30 33 34 35 36  ALT 24 24 25 30 29   ALKPHOS 254* 275* 284* 287* 291*  BILITOT 1.5* 1.4* 1.3* 1.0 1.0  PROT 5.7* 5.1* 5.5* 5.6* 5.8*  ALBUMIN 1.9* 2.0* 2.0* 2.1* 2.1*   No results for input(s): LIPASE, AMYLASE in the last 168 hours. No results for input(s): AMMONIA in the last 168 hours.  CBC:  Recent Labs Lab 10/13/14 0840 10/13/14 1615 10/14/14 0400 10/15/14 0450 10/16/14 0330 10/17/14 0400  WBC 12.7*  --  11.7* 12.9* 16.8* 20.6*  HGB  8.9* 10.5* 8.8* 8.8* 9.1* 9.3*  HCT 29.9* 31.0* 29.3* 29.1* 29.8* 31.4*  MCV 98.0  --  98.3 97.0 97.1 98.4  PLT 482*  --  502* 548* 570* 594*    INR:  Recent Labs Lab 10/13/14 0340 10/14/14 0400 10/15/14 0450 10/16/14 0330 10/17/14 0400  INR 1.41 1.51* 1.49 1.45 1.48    Other results:    Imaging: No results found.   Medications:     Scheduled Medications: . acetaminophen (TYLENOL) oral liquid 160 mg/5 mL  650 mg Per Tube Once   Or  . acetaminophen  650 mg Rectal Once  . antiseptic oral rinse  7 mL Mouth Rinse q12n4p  . budesonide-formoterol  2 puff Inhalation BID  . chlorhexidine  15 mL Mouth Rinse BID  . citalopram  20 mg Oral Daily  . dextromethorphan  30 mg Oral BID  . furosemide  40 mg Oral Daily  . guaiFENesin  15 mL Oral 4 times per day  . insulin aspart  0-15 Units Subcutaneous TID WC  . insulin aspart  0-5 Units Subcutaneous QHS  . pantoprazole  40 mg Oral QHS  . sodium chloride  10 mL Intravenous Q12H  . Warfarin - Physician Dosing Inpatient   Does not apply q1800     Infusions: . lactated ringers 20 mL/hr at 10/17/14 0400    PRN Medications: oxyCODONE **AND** acetaminophen, fentaNYL (SUBLIMAZE) injection, hydrALAZINE, levalbuterol, ondansetron (ZOFRAN) IV, potassium chloride, RESOURCE THICKENUP CLEAR, sodium chloride, traMADol   Assessment:    1.  A/C systolic HF class IV with cardiogenic shock- EF 15% with moderate RV dysfunction 3/16 echo.         -S/P HMII LVAD 09/21/14  2. CAD s/p Anterior STEMI on 06/12/14 with stenting of LAD- was on coumadin + plavix, statin.  Now holding coumadin, off Plavix.  3. Acute on chronic hypercarbic respiratory failure  4. 06/12/14 VT in setting of STEMI 5. Ankylosing spondylitis. 6. Bilateral PE with R DVT- S/P IVC filter 08/27/2014--->coumadin 7. Acute Blood Loss into pleural space:  Back to OR 4/5 and 4/6 for recurrent bleed. Received multiple blood products.  8. PAF: On amiodarone.  Plan/Discussion:   S/P 09/21/2014 HMII LVAD.   Back to OR 4/5 and and again 4/6 for recurrent bleed and evacuation clots. Received multiple blood products. Hemoglobin trending up.   Continues to progress. Weight way down. Will stop lasix. Give back 500 cc NS.   WBC climbing again quickly. No clear source of infection. Afebrile. Will draw cx and check CXR. Will likely need to restart abx. Vanc stopped 4/14 and zosyn stopped 4/15. Will start meropenem. May need ID consult.   On coumadin. INR stuck at 1.5. Goal INR 1.5-2. Will ask pharmacy to help with dosing.  Will not restart Plavix as anterior wall completely necrotic.   Management of chest tubes per TCTS. Hopefully we can get out today. LVAD parameters stable.    I reviewed the LVAD parameters from today, and compared the results to the patient's prior recorded data.  No programming changes were made.  The LVAD is functioning within specified parameters.  The patient performs LVAD self-test daily.  LVAD interrogation was negative for any significant power changes, alarms  or PI events/speed drops.  LVAD equipment check completed and is in good working order.  Back-up equipment present.   LVAD education done on emergency procedures and precautions and reviewed exit site care.   Pearl Demetra Moya,MD 10:30 AM Length of Stay: 70  VAD Team ---  VAD ISSUES ONLY--- Pager [phone] (7am - 7am)  Advanced Heart Failure Team  Pager [phone] (M-F; [place])  Please contact [facility] for night-coverage after hours (4p -7a ) and weekends on [email or web address]

## 2014-10-18 LAB — PROTIME-INR
INR: 1.72 — ABNORMAL HIGH (ref 0.00–1.49)
Prothrombin Time: 20.3 seconds — ABNORMAL HIGH (ref 11.6–15.2)

## 2014-10-18 LAB — COMPREHENSIVE METABOLIC PANEL
ALT: 29 U/L (ref 0–53)
AST: 32 U/L (ref 0–37)
Albumin: 2.1 g/dL — ABNORMAL LOW (ref 3.5–5.2)
Alkaline Phosphatase: 282 U/L — ABNORMAL HIGH (ref 39–117)
Anion gap: 8 (ref 5–15)
BUN: 12 mg/dL (ref 6–23)
CO2: 30 mmol/L (ref 19–32)
Calcium: 8.7 mg/dL (ref 8.4–10.5)
Chloride: 111 mmol/L (ref 96–112)
Creatinine, Ser: 0.82 mg/dL (ref 0.50–1.35)
GFR calc Af Amer: >90 mL/min (ref >90)
GFR calc non Af Amer: >90 mL/min (ref >90)
Glucose, Bld: 117 mg/dL — ABNORMAL HIGH (ref 70–99)
Potassium: 3.6 mmol/L (ref 3.5–5.1)
Sodium: 149 mmol/L — ABNORMAL HIGH (ref 135–145)
Total Bilirubin: 0.9 mg/dL (ref 0.3–1.2)
Total Protein: 5.8 g/dL — ABNORMAL LOW (ref 6.0–8.3)

## 2014-10-18 LAB — CBC
HCT: 30.8 % — ABNORMAL LOW (ref 39.0–52.0)
Hemoglobin: 9.3 g/dL — ABNORMAL LOW (ref 13.0–17.0)
MCH: 29.9 pg (ref 26.0–34.0)
MCHC: 30.2 g/dL (ref 30.0–36.0)
MCV: 99 fL (ref 78.0–100.0)
Platelets: 532 10*3/uL — ABNORMAL HIGH (ref 150–400)
RBC: 3.11 MIL/uL — ABNORMAL LOW (ref 4.22–5.81)
RDW: 19.2 % — ABNORMAL HIGH (ref 11.5–15.5)
WBC: 22.9 10*3/uL — ABNORMAL HIGH (ref 4.0–10.5)

## 2014-10-18 LAB — LACTATE DEHYDROGENASE: LDH: 414 U/L — ABNORMAL HIGH (ref 94–250)

## 2014-10-18 LAB — GLUCOSE, CAPILLARY
GLUCOSE-CAPILLARY: 89 mg/dL (ref 70–99)
GLUCOSE-CAPILLARY: 104 mg/dL — AB (ref 70–99)
Glucose-Capillary: 103 mg/dL — ABNORMAL HIGH (ref 70–99)
Glucose-Capillary: 119 mg/dL — ABNORMAL HIGH (ref 70–99)

## 2014-10-18 MED ORDER — POTASSIUM CHLORIDE 10 MEQ/50ML IV SOLN
10.0000 meq | INTRAVENOUS | Status: AC
  Administered 2014-10-18 (×3): 10 meq via INTRAVENOUS
  Filled 2014-10-18 (×3): qty 50

## 2014-10-18 MED ORDER — VANCOMYCIN HCL IN DEXTROSE 750-5 MG/150ML-% IV SOLN
750.0000 mg | Freq: Three times a day (TID) | INTRAVENOUS | Status: DC
  Administered 2014-10-18 – 2014-10-20 (×6): 750 mg via INTRAVENOUS
  Filled 2014-10-18 (×9): qty 150

## 2014-10-18 MED ORDER — WARFARIN SODIUM 2.5 MG PO TABS
2.5000 mg | ORAL_TABLET | Freq: Once | ORAL | Status: AC
  Administered 2014-10-18: 2.5 mg via ORAL
  Filled 2014-10-18 (×2): qty 1

## 2014-10-18 MED ORDER — VANCOMYCIN HCL IN DEXTROSE 1-5 GM/200ML-% IV SOLN
1000.0000 mg | Freq: Two times a day (BID) | INTRAVENOUS | Status: DC
  Filled 2014-10-18 (×2): qty 200

## 2014-10-18 MED ORDER — FLUCONAZOLE IN SODIUM CHLORIDE 200-0.9 MG/100ML-% IV SOLN
200.0000 mg | INTRAVENOUS | Status: AC
  Administered 2014-10-18 – 2014-10-19 (×2): 200 mg via INTRAVENOUS
  Filled 2014-10-18 (×3): qty 100

## 2014-10-18 MED ORDER — TRAZODONE HCL 50 MG PO TABS
50.0000 mg | ORAL_TABLET | Freq: Every day | ORAL | Status: DC
  Administered 2014-10-18 – 2014-11-01 (×15): 50 mg via ORAL
  Filled 2014-10-18 (×17): qty 1

## 2014-10-18 NOTE — Progress Notes (Signed)
ANTIBIOTIC/ANTICOAGULATION CONSULT NOTE - INITIAL  Pharmacy Consult for Vancomycin, Coumadin Indication: leukocytosis, LVAD  No Known Allergies  Patient Measurements: Height: 5\' 8"  (172.7 cm) Weight: 173 lb 8 oz (78.7 kg) IBW/kg (Calculated) : 68.4 Adjusted Body Weight: n/a   Labs:  Recent Labs  10/16/14 0330 10/17/14 0400 10/18/14 0401  WBC 16.8* 20.6* 22.9*  HGB 9.1* 9.3* 9.3*  PLT 570* 594* 532*  CREATININE 0.82 0.81 0.82   Estimated Creatinine Clearance: 85.7 mL/min (by C-G formula based on Cr of 0.82). No results for input(s): VANCOTROUGH, VANCOPEAK, VANCORANDOM, GENTTROUGH, GENTPEAK, GENTRANDOM, TOBRATROUGH, TOBRAPEAK, TOBRARND, AMIKACINPEAK, AMIKACINTROU, AMIKACIN in the last 72 hours.   Lab Results  Component Value Date   INR 1.72* 10/18/2014   INR 1.48 10/17/2014   INR 1.45 10/16/2014    Assessment:  69 yo male s/p LVAD, s/p PNA, has had several courses of various antibiotics (see details below), now with rising WBC.  Remains afebrile.  No other sources of elevated WBC identified (no steroids, etc).  Started on meropenem and fluconazole yesterday, pharmacy asked to resume vancomycin today.  3/17 Right Pleural fluid>> No growth 3/22 Pericardium tissue>> No growth 3/22 Pleural fluid>> No growth 3/22 Resp>>No growth 3/26 Urine >> No growth 3/28 Urine>> No growth 4/5 Blood >> ngtd 4/5 Resp >> nl flora 4/17 Blood>>  3/17 >> vanc>>3/21, resume 4/5>>4/14 resume 4/18 3/26 VT 14.7 4/8 VT 16 on 1g q12 3/17 >> ceftaz >> 3/22, 3/24>> 3/28 3/22 >> Zinacef >> [x 48 hrs] 3/22 rifampin x2 complete 3/28 augmentin>>29 3/29 zosyn>> 4/5, resume 4/6>>4/14 4/17 Merrem>>  4/17 Fluconazole > (x 3 doses)  Also on chronic Coumadin for LVAD.  INR goal 1.5-2 given recent bleeding.  INR up to 1.72 after 2 doses of increased Coumadin (4 mg).  Goal of Therapy:  Vancomycin trough level 15-20 mcg/ml INR 2-2.5  Plan:  1. Vancomycin 750 mg IV q 8 hrs. 2. Check vancomycin  trough at steady state as necessary. 3. Increase fluconazole to full treatment dose of 200 mg for remaining doses. 4. Coumadin 2.5 mg po x 1. 5. Daily INR.  Uvaldo Rising, BCPS  Clinical Pharmacist Pager [phone]  10/18/2014 1:08 PM

## 2014-10-18 NOTE — Progress Notes (Signed)
Medicare Important Message given? YES  Date Medicare IM given:  10/18/2014  Medicare IM given by: Lailah Marcelli  

## 2014-10-18 NOTE — Progress Notes (Signed)
PT Cancellation Note  Patient Details Name: MIRIAM KESTLER MRN: [id number] DOB: 26-Aug-1945   Cancelled Treatment:    Reason Eval/Treat Not Completed: Fatigue/lethargy limiting ability to participate   Pt just transferred from 2S to 2W and declined to ambulate due to fatigue (which RN confirmed). Will attempt to see later as schedule permits.   Marika Mahaffy 10/18/2014, 12:13 PM Pager [phone]

## 2014-10-18 NOTE — Progress Notes (Signed)
NUTRITION FOLLOW UP  Intervention:   Magic cup TID with meals, each supplement provides 290 kcal and 9 grams of protein RD to follow for nutrition care plan  Nutrition Dx:   Increased nutrient needs related to LVAD as evidenced by estimated energy needs, ongoing  Goal:   Pt to meet >/= 90% of estimated energy needs, currently unmet  Monitor:   PO & supplemental intake, weight, labs, I/O's  Assessment:   69 y/o with history of CAD with stenting of LCX 2013, DM2, carotid stenting and ankylosing spondylitis. Presented with an anterolateral MI in 12/15 and was discharged with a life vest and medical therapy. Pt was admitted with worsening heart failure symptoms and has undergone right heart catheterization and is now going to be started on Milrinone and then LVAD.   Prior to intubation, pt was consuming 50-100% of meals on a regular diet. Ensure supplements were ordered, however, pt refused them due to preferring Magic Cups.    Patient s/p procedures 4/6: LEFT THORACOTOMY  EVACUATION OF LEFT CHEST HEMATOMA RE-EXPLORATION FOR BLEEDING  Patient extubated 4/10.    Prior to intubation, pt was consuming 50-100% of meals on a regular diet. Ensure supplements were ordered, however, pt refused them due to preferring Magic Cups.   TF (Vital 1.5 formula) discontinued.  S/p FEES 4/12 and 4/18.  SLP recommedning Dys 3, thin liquid diet.  PO intake poor at 20% per flowsheet records.  Would benefit from addition of oral nutrition supplements.  RD to order.   Height: Ht Readings from Last 1 Encounters:  09/30/14 5\' 8"  (1.727 m)    Weight Status:   Wt Readings from Last 1 Encounters:  10/18/14 173 lb 8 oz (78.7 kg)   09/29/14 184 lb 8.4 oz (83.7 kg)   09/24/14 192 lb 7.4 oz (87.3 kg)            Body mass index is 26.39 kg/(m^2).  Re-estimated needs:  Kcal: 1900-2100 Protein: 125-135 grams Fluid: >2.1 L  Skin: closed lt chest incision, closed rt abdominal incision  Diet  Order: DIET DYS 3 Room service appropriate?: Yes; Fluid consistency:: Thin   Intake/Output Summary (Last 24 hours) at 10/18/14 1443 Last data filed at 10/18/14 1100  Gross per 24 hour  Intake   1000 ml  Output   1330 ml  Net   -330 ml    Labs:   Recent Labs Lab 10/15/14 0450 10/16/14 0330 10/17/14 0400 10/18/14 0401  NA 144 147* 148* 149*  K 3.7 3.6 3.7 3.6  CL 102 105 108 111  CO2 34* 32 33* 30  BUN 10 12 12 12   CREATININE 0.76 0.82 0.81 0.82  CALCIUM 8.5 8.8 8.7 8.7  MG 2.1  --   --   --   GLUCOSE 101* 107* 114* 117*    CBG (last 3)   Recent Labs  10/17/14 2156 10/18/14 0756 10/18/14 1206  GLUCAP 118* 103* 89    Scheduled Meds: . acetaminophen (TYLENOL) oral liquid 160 mg/5 mL  650 mg Per Tube Once   Or  . acetaminophen  650 mg Rectal Once  . antiseptic oral rinse  7 mL Mouth Rinse q12n4p  . budesonide-formoterol  2 puff Inhalation BID  . chlorhexidine  15 mL Mouth Rinse BID  . citalopram  20 mg Oral Daily  . dextromethorphan  30 mg Oral BID  . fluconazole (DIFLUCAN) IV  200 mg Intravenous Q24H  . furosemide  40 mg Oral Daily  . insulin aspart  0-15 Units Subcutaneous TID WC  . insulin aspart  0-5 Units Subcutaneous QHS  . meropenem (MERREM) IV  1 g Intravenous Q8H  . pantoprazole  40 mg Oral QHS  . sodium chloride  10 mL Intravenous Q12H  . traZODone  50 mg Oral QHS  . vancomycin  750 mg Intravenous Q8H  . warfarin  2.5 mg Oral ONCE-1800  . Warfarin - Pharmacist Dosing Inpatient   Does not apply q1800    Continuous Infusions: . lactated ringers 20 mL/hr at 10/17/14 0400    Arthur Holms, RD, LDN Pager #: [phone] After-Hours Pager #: [phone]

## 2014-10-18 NOTE — Progress Notes (Signed)
Patient ID: Scott [name], male   DOB: 21-May-1946, 69 y.o.   MRN: [id number] HeartMate 2 Rounding Note  Subjective:    S/P HMII LVAD on 09/20/2013.   Moved to ICU  4/5 due to low MAP and low hemoglobin. Taken Back to the OR 4/5 and 4/6  for recurrent bleed and evacuation of clots from left chest.   Doing well Off milrinone. Hgb stable. Off diuretics. WBC trending up. Started on Vanc, Mero, and fluconazole. Doing well on room air.   Denies SOB. Having difficulty sleeping.   LVAD INTERROGATION:  HeartMate II LVAD:  Flow 4.8  liters/min, speed 9400, power 6, PI 6.5  No PI events.     Objective:    Vital Signs:   Temp:  [96.4 F (35.8 C)-98.4 F (36.9 C)] 97.3 F (36.3 C) (04/18 0759) Pulse Rate:  [89-104] 100 (04/18 0800) Resp:  [20-36] 31 (04/18 0800) SpO2:  [93 %-100 %] 100 % (04/18 0800) FiO2 (%):  [40 %] 40 % (04/18 0017) Weight:  [173 lb 8 oz (78.7 kg)] 173 lb 8 oz (78.7 kg) (04/18 0500) Last BM Date: 10/17/14 Mean arterial Pressure  90-102 Intake/Output:   Intake/Output Summary (Last 24 hours) at 10/18/14 0827 Last data filed at 10/18/14 0800  Gross per 24 hour  Intake   1290 ml  Output   1310 ml  Net    -20 ml     Physical Exam:  General: In chair.  HEENT: normal Neck: supple. Carotids 2+ bilat; no bruits. No lymphadenopathy or thryomegaly appreciated.  Cor: Mechanical heart sounds with LVAD hum present. Lungs: clear On room air.   Abdomen: soft, nontender, nondistended. No hepatosplenomegaly. No bruits or masses. Good bowel sounds. Driveline: Dressing looks good.  Extremities: no cyanosis, clubbing, rash.  No edema RUE PICC Neuro: Awake alert   Telemetry: NSR 90s    Labs: Basic Metabolic Panel:  Recent Labs Lab 10/14/14 0400 10/15/14 0450 10/16/14 0330 10/17/14 0400 10/18/14 0401  NA 142 144 147* 148* 149*  K 3.7 3.7 3.6 3.7 3.6  CL 97 102 105 108 111  CO2 38* 34* 32 33* 30  GLUCOSE 102* 101* 107* 114* 117*  BUN 13 10 12 12 12   CREATININE  0.78 0.76 0.82 0.81 0.82  CALCIUM 8.6 8.5 8.8 8.7 8.7  MG  --  2.1  --   --   --     Liver Function Tests:  Recent Labs Lab 10/14/14 0400 10/15/14 0450 10/16/14 0330 10/17/14 0400 10/18/14 0401  AST 33 34 35 36 32  ALT 24 25 30 29 29   ALKPHOS 275* 284* 287* 291* 282*  BILITOT 1.4* 1.3* 1.0 1.0 0.9  PROT 5.1* 5.5* 5.6* 5.8* 5.8*  ALBUMIN 2.0* 2.0* 2.1* 2.1* 2.1*   No results for input(s): LIPASE, AMYLASE in the last 168 hours. No results for input(s): AMMONIA in the last 168 hours.  CBC:  Recent Labs Lab 10/14/14 0400 10/15/14 0450 10/16/14 0330 10/17/14 0400 10/18/14 0401  WBC 11.7* 12.9* 16.8* 20.6* 22.9*  HGB 8.8* 8.8* 9.1* 9.3* 9.3*  HCT 29.3* 29.1* 29.8* 31.4* 30.8*  MCV 98.3 97.0 97.1 98.4 99.0  PLT 502* 548* 570* 594* 532*    INR:  Recent Labs Lab 10/14/14 0400 10/15/14 0450 10/16/14 0330 10/17/14 0400 10/18/14 0401  INR 1.51* 1.49 1.45 1.48 1.72*    Other results:    Imaging: Dg Chest Port 1 View  10/17/2014   CLINICAL DATA:  Followup left ventricular assist device.  EXAM:  PORTABLE CHEST - 1 VIEW  COMPARISON:  Yesterday.  FINDINGS: Stable enlarged cardiac silhouette, median sternotomy wires and skin clips. The left ventricular assist device is unchanged. Right PICC tip at the superior cavoatrial junction. Decreased airspace opacity in the right lower lung zone and at the left lung base.  IMPRESSION: Decreased bibasilar patchy atelectasis or pneumonia.   Electronically Signed   By: Claudie Revering M.D.   On: 10/17/2014 11:55     Medications:     Scheduled Medications: . acetaminophen (TYLENOL) oral liquid 160 mg/5 mL  650 mg Per Tube Once   Or  . acetaminophen  650 mg Rectal Once  . antiseptic oral rinse  7 mL Mouth Rinse q12n4p  . budesonide-formoterol  2 puff Inhalation BID  . chlorhexidine  15 mL Mouth Rinse BID  . citalopram  20 mg Oral Daily  . dextromethorphan  30 mg Oral BID  . fluconazole (DIFLUCAN) IV  100 mg Intravenous Q24H  .  furosemide  40 mg Oral Daily  . insulin aspart  0-15 Units Subcutaneous TID WC  . insulin aspart  0-5 Units Subcutaneous QHS  . meropenem (MERREM) IV  1 g Intravenous Q8H  . pantoprazole  40 mg Oral QHS  . sodium chloride  10 mL Intravenous Q12H  . vancomycin  750 mg Intravenous Q8H  . warfarin  2.5 mg Oral ONCE-1800  . Warfarin - Pharmacist Dosing Inpatient   Does not apply q1800    Infusions: . lactated ringers 20 mL/hr at 10/17/14 0400    PRN Medications: oxyCODONE **AND** acetaminophen, fentaNYL (SUBLIMAZE) injection, hydrALAZINE, levalbuterol, ondansetron (ZOFRAN) IV, RESOURCE THICKENUP CLEAR, sodium chloride, traMADol   Assessment:    1.  A/C systolic HF class IV with cardiogenic shock- EF 15% with moderate RV dysfunction 3/16 echo.         -S/P HMII LVAD 09/21/14  2. CAD s/p Anterior STEMI on 06/12/14 with stenting of LAD- was on coumadin + plavix, statin.  Now holding coumadin, off Plavix.  3. Acute on chronic hypercarbic respiratory failure  4. 06/12/14 VT in setting of STEMI 5. Ankylosing spondylitis. 6. Bilateral PE with R DVT- S/P IVC filter 08/27/2014--->coumadin 7. Acute Blood Loss into pleural space:  Back to OR 4/5 and 4/6 for recurrent bleed. Received multiple blood products.  8. PAF: On amiodarone.  Plan/Discussion:   S/P 09/21/2014 HMII LVAD.   Back to OR 4/5 and and again 4/6 for recurrent bleed and evacuation clots. Received multiple blood products. Hemoglobin stable 9.3.   Continues to progress. Weight unchanged. Off diuretics.    WBC continues to go up.  No clear source of infection. Afebrile. Blood and urine NGTD. Continue  Vanc, meropenem, and fluconazole. May need ID consult.   On coumadin. INR 1.72. Goal INR 1.5-2. Dr Darcey Nora dosing coumadin.    Will not restart Plavix as anterior wall completely necrotic.   Management of chest tubes per TCTS. Hopefully we can get out today. LVAD parameters stable.    I reviewed the LVAD parameters from today,  and compared the results to the patient's prior recorded data.  No programming changes were made.  The LVAD is functioning within specified parameters.  The patient performs LVAD self-test daily.  LVAD interrogation was negative for any significant power changes, alarms or PI events/speed drops.  LVAD equipment check completed and is in good working order.  Back-up equipment present.   LVAD education done on emergency procedures and precautions and reviewed exit site care.   CLEGG,AMY NP-C  8:27 AM Length of Stay: 84  VAD Team --- VAD ISSUES ONLY--- Pager [phone] (7am - 7am)  Advanced Heart Failure Team  Pager [phone] (M-F; 7a - 4p)  Please contact [facility] for night-coverage after hours (4p -7a ) and weekends on [email or web address]  Patient seen and examined with Darrick Grinder, NP. We discussed all aspects of the encounter. I agree with the assessment and plan as stated above.   WBC still climbing despite meropenem. Dr. Erskine Emery added vancomycin and fluconazole. Left chest tube to be removed today. Volume status stable off lasix. Remaining in NSR.   INR coming up slowly. Hgb stable.   Continue to ambulate.   VAD parameters stable.   Khaliq Bensimhon,MD 9:07 AM

## 2014-10-18 NOTE — Procedures (Signed)
Objective Swallowing Evaluation: Fiberoptic Endoscopic Evaluation of Swallowing  Patient Details  Name: Scott [name] MRN: [id number] Date of Birth: 1945-12-23  Today's Date: 10/18/2014 Time: SLP Start Time (ACUTE ONLY): 1330-SLP Stop Time (ACUTE ONLY): 1401 SLP Time Calculation (min) (ACUTE ONLY): 31 min  Past Medical History:  Past Medical History  Diagnosis Date  . Hyperlipidemia   . Hypertension   . Diabetes   . Nephrolithiasis   . Ankylosing spondylitis   . CAD (coronary artery disease)     a. stenting of LCx 2013; b. STEMI 06/12/14 s/p PCI to LAD complicated by post cath shock requiring IABP; VT s/p DCCV, EF 20%; c. NSTEMI 06/26/14 treated medically.  . Ischemic cardiomyopathy     a. echo 08/23/2014 EF <20%, dilated CM, mod MR/TR  . Acute on chronic respiratory failure     a. 08/2014 in setting of PE.  . Bilateral pulmonary embolism     a. 08/2014 - started on Coumadin. Retrievable IVC filter placed 08/27/14 due to RV strain and large clot burden.  . Right leg DVT     a. 08/2014.  Marland Kitchen Chronic systolic CHF (congestive heart failure)   . Hypotension   . Hemoptysis     a. 08/2014 possibly due to pulm infarct/PE.  Marland Kitchen Pleural effusion on right 08/2014 - small  . Leukocytosis   . Carotid artery disease     a. s/p stenting.  . Ventricular tachycardia     a. 06/2014 at time of MI, s/p DCCV.  Marland Kitchen Reactive thrombocytosis 09/10/2014  . Leukocytosis 09/10/2014   Past Surgical History:  Past Surgical History  Procedure Laterality Date  . Left heart catheterization with coronary angiogram N/A 06/12/2014    Procedure: LEFT HEART CATHETERIZATION WITH CORONARY ANGIOGRAM;  Surgeon: Lorretta Harp, MD;  Location[facility] C[facility] LAB;  Service: Cardiovascular;  Laterality: N/A;  . Right heart catheterization N/A 09/10/2014    Procedure: RIGHT HEART CATH;  Surgeon: [name], MD;  Location: [facility] CATH LAB;  Service: Cardiovascular;  Laterality: N/A;  . Right heart catheterization N/A 09/17/2014     Procedure: RIGHT HEART CATH;  Surgeon: [name], MD;  Location: [facility] CATH LAB;  Service: Cardiovascular;  Laterality: N/A;  . Insertion of implantable left ventricular assist device N/A 09/21/2014    Procedure: INSERTION OF IMPLANTABLE LEFT VENTRICULAR ASSIST DEVICE;  Surgeon: Ivin Poot, MD;  Location: [place];  Service: Open Heart Surgery;  Laterality: N/A;  CIRC ARREST  NITRIC OXIDE  . Tee without cardioversion N/A 09/21/2014    Procedure: TRANSESOPHAGEAL ECHOCARDIOGRAM (TEE);  Surgeon: Ivin Poot, MD;  Location: [place];  Service: Open Heart Surgery;  Laterality: N/A;  . Tee without cardioversion N/A 10/05/2014    Procedure: TRANSESOPHAGEAL ECHOCARDIOGRAM (TEE);  Surgeon: Ivin Poot, MD;  Location: [place];  Service: Open Heart Surgery;  Laterality: N/A;  . Bedside evacuation of hematoma  10/05/2014    Procedure: EVACUATION OF HEMATOMA;  Surgeon: Ivin Poot, MD;  Location: [place];  Service: Open Heart Surgery;;  . Video assisted thoracoscopy (vats)/thorocotomy Left 10/06/2014    Procedure: VIDEO ASSISTED THORACOSCOPY (VATS)/THOROCOTOMY;  Surgeon: Ivin Poot, MD;  Location: [place];  Service: Open Heart Surgery;  Laterality: Left;   HPI:  HPI: 69 y.o. M with extensive cardiac hx as well as recent PE / DVT, admitted for SOB due to heart failure, cardiogenic, shock, PE. Evaluated by VAD team. Found to have mod - large R pleural effusion. LVAD implantation 09/22/14. Progressed  well until developed hemothorax and required return to OR 4/5 and 4/6 for Redo sternotomy, reexploration of chest and removal of hematoma.  No Data Recorded  Assessment / Plan / Recommendation CHL IP CLINICAL IMPRESSIONS 10/18/2014  Dysphagia Diagnosis Mild pharyngeal phase dysphagia   Clinical impression Pt has improved strength and coordination since previous FEES, with trace penetration from straw sips of thin and nectar thick liquids, requiring cues from SLP to cough and clear. Min cues for small,  single cup sips increased pt's ability to adequately protect his airway. Mild vallecular residuals remain with trials of regular textures, although pt continues to endorse lethargy. Recommend advancement to Dys 3 diet to conserve energy and thin liquids via cup, with SLP f/u for tolerance and readiness to advance solids as endurance improves.      CHL IP TREATMENT RECOMMENDATION 10/18/2014  Treatment Plan Recommendations Therapy as outlined in treatment plan below     CHL IP DIET RECOMMENDATION 10/18/2014  Diet Recommendations Dysphagia 3 (Mechanical Soft);Thin liquid  Liquid Administration via Cup;No straw  Medication Administration Whole meds with puree  Compensations Slow rate;Small sips/bites;Multiple dry swallows after each bite/sip  Postural Changes and/or Swallow Maneuvers Seated upright 90 degrees;Upright 30-60 min after meal     CHL IP OTHER RECOMMENDATIONS 10/18/2014  Recommended Consults (None)  Oral Care Recommendations Oral care BID  Other Recommendations (None)     CHL IP FOLLOW UP RECOMMENDATIONS 10/18/2014  Follow up Recommendations Inpatient Rehab     CHL IP FREQUENCY AND DURATION 10/18/2014  Speech Therapy Frequency (ACUTE ONLY) min 2x/week  Treatment Duration 2 weeks     Pertinent Vitals/Pain: n/a     SLP Swallow Goals No flowsheet data found.  No flowsheet data found.    CHL IP REASON FOR REFERRAL 10/18/2014  Reason for Referral Objectively evaluate swallowing function     CHL IP ORAL PHASE 10/18/2014  Lips (None)  Tongue (None)  Mucous membranes (None)  Nutritional status (None)  Other (None)  Oxygen therapy (None)  Oral Phase WFL  Oral - Pudding Teaspoon (None)  Oral - Pudding Cup (None)  Oral - Honey Teaspoon (None)  Oral - Honey Cup (None)  Oral - Honey Syringe (None)  Oral - Nectar Teaspoon (None)  Oral - Nectar Cup (None)  Oral - Nectar Straw (None)  Oral - Nectar Syringe (None)  Oral - Ice Chips (None)  Oral - Thin Teaspoon (None)  Oral -  Thin Cup (None)  Oral - Thin Straw (None)  Oral - Thin Syringe (None)  Oral - Puree (None)  Oral - Mechanical Soft (None)  Oral - Regular (None)  Oral - Multi-consistency (None)  Oral - Pill (None)  Oral Phase - Comment (None)      CHL IP PHARYNGEAL PHASE 10/18/2014  Pharyngeal Phase Impaired  Pharyngeal - Pudding Teaspoon (None)  Penetration/Aspiration details (pudding teaspoon) (None)  Pharyngeal - Pudding Cup (None)  Penetration/Aspiration details (pudding cup) (None)  Pharyngeal - Honey Teaspoon (None)  Penetration/Aspiration details (honey teaspoon) (None)  Pharyngeal - Honey Cup (None)  Penetration/Aspiration details (honey cup) (None)  Pharyngeal - Honey Syringe (None)  Penetration/Aspiration details (honey syringe) (None)  Pharyngeal - Nectar Teaspoon NT  Penetration/Aspiration details (nectar teaspoon) (None)  Pharyngeal - Nectar Cup Premature spillage to valleculae;Reduced airway/laryngeal closure;Reduced tongue base retraction;Reduced laryngeal elevation;Reduced anterior laryngeal mobility;Pharyngeal residue - valleculae;Pharyngeal residue - pyriform sinuses  Penetration/Aspiration details (nectar cup) (None)  Pharyngeal - Nectar Straw Premature spillage to valleculae;Reduced airway/laryngeal closure;Reduced tongue base retraction;Reduced laryngeal elevation;Reduced anterior laryngeal  mobility;Pharyngeal residue - valleculae;Pharyngeal residue - pyriform sinuses;Penetration/Aspiration during swallow  Penetration/Aspiration details (nectar straw) Material enters airway, remains ABOVE vocal cords and not ejected out  Pharyngeal - Nectar Syringe (None)  Penetration/Aspiration details (nectar syringe) (None)  Pharyngeal - Ice Chips (None)  Penetration/Aspiration details (ice chips) (None)  Pharyngeal - Thin Teaspoon NT  Penetration/Aspiration details (thin teaspoon) (None)  Pharyngeal - Thin Cup Reduced anterior laryngeal mobility;Reduced laryngeal elevation;Reduced tongue  base retraction;Reduced airway/laryngeal closure;Pharyngeal residue - valleculae;Pharyngeal residue - pyriform sinuses;Premature spillage to valleculae  Penetration/Aspiration details (thin cup) Material does not enter airway  Pharyngeal - Thin Straw Reduced airway/laryngeal closure;Reduced tongue base retraction;Reduced laryngeal elevation;Reduced anterior laryngeal mobility;Pharyngeal residue - valleculae;Pharyngeal residue - pyriform sinuses;Penetration/Aspiration during swallow;Premature spillage to valleculae  Penetration/Aspiration details (thin straw) Material enters airway, remains ABOVE vocal cords then ejected out  Pharyngeal - Thin Syringe (None)  Penetration/Aspiration details (thin syringe') (None)  Pharyngeal - Puree NT  Penetration/Aspiration details (puree) (None)  Pharyngeal - Mechanical Soft (None)  Penetration/Aspiration details (mechanical soft) (None)  Pharyngeal - Regular Pharyngeal residue - valleculae;Reduced tongue base retraction;Reduced airway/laryngeal closure;Reduced laryngeal elevation;Reduced anterior laryngeal mobility  Penetration/Aspiration details (regular) (None)  Pharyngeal - Multi-consistency (None)  Penetration/Aspiration details (multi-consistency) (None)  Pharyngeal - Pill (None)  Penetration/Aspiration details (pill) (None)  Pharyngeal Comment (None)           Germain Osgood, M.A. CCC-SLP [phone]  Germain Osgood 10/18/2014, 2:25 PM

## 2014-10-19 ENCOUNTER — Inpatient Hospital Stay (HOSPITAL_COMMUNITY): Payer: Medicare Other

## 2014-10-19 LAB — COMPREHENSIVE METABOLIC PANEL
ALT: 30 U/L (ref 0–53)
AST: 38 U/L — ABNORMAL HIGH (ref 0–37)
Albumin: 2.1 g/dL — ABNORMAL LOW (ref 3.5–5.2)
Alkaline Phosphatase: 277 U/L — ABNORMAL HIGH (ref 39–117)
Anion gap: 8 (ref 5–15)
BUN: 11 mg/dL (ref 6–23)
CO2: 30 mmol/L (ref 19–32)
Calcium: 8.4 mg/dL (ref 8.4–10.5)
Chloride: 108 mmol/L (ref 96–112)
Creatinine, Ser: 0.78 mg/dL (ref 0.50–1.35)
GFR calc Af Amer: >90 mL/min (ref >90)
GFR calc non Af Amer: >90 mL/min (ref >90)
Glucose, Bld: 127 mg/dL — ABNORMAL HIGH (ref 70–99)
Potassium: 3.4 mmol/L — ABNORMAL LOW (ref 3.5–5.1)
Sodium: 146 mmol/L — ABNORMAL HIGH (ref 135–145)
Total Bilirubin: 0.7 mg/dL (ref 0.3–1.2)
Total Protein: 5.4 g/dL — ABNORMAL LOW (ref 6.0–8.3)

## 2014-10-19 LAB — PROTIME-INR
INR: 1.81 — ABNORMAL HIGH (ref 0.00–1.49)
Prothrombin Time: 21.2 seconds — ABNORMAL HIGH (ref 11.6–15.2)

## 2014-10-19 LAB — CBC
HCT: 29.9 % — ABNORMAL LOW (ref 39.0–52.0)
Hemoglobin: 9 g/dL — ABNORMAL LOW (ref 13.0–17.0)
MCH: 29.7 pg (ref 26.0–34.0)
MCHC: 30.1 g/dL (ref 30.0–36.0)
MCV: 98.7 fL (ref 78.0–100.0)
Platelets: 441 10*3/uL — ABNORMAL HIGH (ref 150–400)
RBC: 3.03 MIL/uL — ABNORMAL LOW (ref 4.22–5.81)
RDW: 19.4 % — ABNORMAL HIGH (ref 11.5–15.5)
WBC: 21 10*3/uL — ABNORMAL HIGH (ref 4.0–10.5)

## 2014-10-19 LAB — GLUCOSE, CAPILLARY
GLUCOSE-CAPILLARY: 98 mg/dL (ref 70–99)
GLUCOSE-CAPILLARY: 98 mg/dL (ref 70–99)
Glucose-Capillary: 81 mg/dL (ref 70–99)
Glucose-Capillary: 132 mg/dL — ABNORMAL HIGH (ref 70–99)

## 2014-10-19 LAB — LACTATE DEHYDROGENASE: LDH: 409 U/L — AB (ref 94–250)

## 2014-10-19 MED ORDER — POTASSIUM CHLORIDE CRYS ER 20 MEQ PO TBCR
40.0000 meq | EXTENDED_RELEASE_TABLET | Freq: Once | ORAL | Status: AC
  Administered 2014-10-19: 40 meq via ORAL
  Filled 2014-10-19: qty 2

## 2014-10-19 MED ORDER — WARFARIN - PHYSICIAN DOSING INPATIENT
Freq: Every day | Status: DC
  Administered 2014-10-22 – 2014-10-25 (×2)

## 2014-10-19 MED ORDER — WARFARIN SODIUM 1 MG PO TABS
1.0000 mg | ORAL_TABLET | Freq: Once | ORAL | Status: AC
  Administered 2014-10-19: 1 mg via ORAL
  Filled 2014-10-19 (×2): qty 1

## 2014-10-19 NOTE — Progress Notes (Signed)
Physical Therapy Treatment Patient Details Name: Scott [name] MRN: [id number] DOB: [birth year] Today's Date: 10/19/2014    History of Present Illness 69 y.o. M with extensive cardiac hx as well as recent PE / DVT, admitted for SOB due to heart failure, cardiogenic, shock, PE. Evaluated by VAD team. Found to have mod - large R pleural effusion. LVAD implantation 09/22/14. Progressed well until developed hemothorax and required return to OR 4/5 and 4/6 for Redo sternotomy, reexploration of chest and removal of hematoma.    PT Comments    Pt progressing well. He politely reminded me that he has cervical (and other) limitations due to his ankylosing spondylitis and cannot physically see the pockets and latches to attach his batteries to his vest. His wife has demonstrated complete independence with switching between [facility] and battery power. Pt will soon be ready to d/c from acute PT as he is ambulating much better.   Follow Up Recommendations  Home health PT;Supervision/Assistance - 24 hour     Equipment Recommendations  Other (comment) (2 wheeled RW with fold down seat)    Recommendations for Other Services       Precautions / Restrictions Precautions Precautions: Sternal;Fall Precaution Comments: LVAD    Mobility  Bed Mobility Overal bed mobility: Needs Assistance Bed Mobility: Sit to Sidelying         Sit to sidelying: Supervision General bed mobility comments: no cues needed for correct technique  Transfers Overall transfer level: Needs assistance Equipment used: Rolling walker (2 wheeled) Transfers: Sit to/from Stand Sit to Stand: Min guard         General transfer comment: from recliner x 2; pt prefers to put hands on RW to prevent pushing up with hands (and leaves them on RW to prevent reaching back to lower himself); no loss of balance  Ambulation/Gait Ambulation/Gait assistance: Min guard Ambulation Distance (Feet): 220 Feet (seated rest x 2 minutes; 100  ft) Assistive device: Rolling walker (2 wheeled) Gait Pattern/deviations: Step-through pattern;Decreased stride length;Trunk flexed     General Gait Details: pt requested to use 1L [place] O2 despite RA SaO2 97% ("I know I may not need it, but it makes me feel better and I can do more") SaO2 on 1L 98% after walking 258ft   Stairs            Wheelchair Mobility    Modified Rankin (Stroke Patients Only)       Balance     Sitting balance-Leahy Scale: Fair       Standing balance-Leahy Scale: Fair Standing balance comment: stood without UE support (although RW in reach) x 2 minutes while gown and lines adjusted                    Cognition Arousal/Alertness: Awake/alert Behavior During Therapy: Flat affect Overall Cognitive Status: Within Functional Limits for tasks assessed                      Exercises      General Comments General comments (skin integrity, edema, etc.): wife present: pt able to convert to battery power, however needs wife's assist to don vest (around PICC line) and place batteries in pouches. Reports he cannot bend his neck down enough to see pouches (due to ankylosing spondylitis)      Pertinent Vitals/Pain Pain Assessment: No/denies pain    Home Living  Prior Function            PT Goals (current goals can now be found in the care plan section) Acute Rehab PT Goals Patient Stated Goal: to go home  Time For Goal Achievement: 10/25/14 Progress towards PT goals: Progressing toward goals    Frequency  Min 3X/week    PT Plan Current plan remains appropriate    Co-evaluation             End of Session Equipment Utilized During Treatment: Oxygen Activity Tolerance: Patient limited by fatigue Patient left: with call bell/phone within reach;with family/visitor present;in bed     Time: 4103-0131 PT Time Calculation (min) (ACUTE ONLY): 34 min  Charges:  $Gait Training: 8-22 mins $Self  Care/Home Management: 8-22                    G Codes:      Elier Zellars 11/02/2014, 3:03 PM Pager [phone]

## 2014-10-19 NOTE — Progress Notes (Signed)
Speech Language Pathology Treatment: Dysphagia  Patient Details Name: Scott [name] MRN: [id number] DOB: [birth year] Today's Date: 10/19/2014 Time: 2902-1115 SLP Time Calculation (min) (ACUTE ONLY): 10 min  Assessment / Plan / Recommendation Clinical Impression  F/u after yesterday's FEES.  Wife present.  Reviewed results and recs, including avoiding straws, following basic precautions as posted at [facility] for safety.  Mod I cueing for above.  Pt with excellent toleration of thin liquids with no overt indication of penetration/aspiration. Phonation with improving quality/volume.  Recommend advancing diet to regular, thins to allow for increased variety. (Pt requesting oranges and fruits not on Dysphagia 3 diet.)     HPI HPI: 69 y.o. M with extensive cardiac hx as well as recent PE / DVT, admitted for SOB due to heart failure, cardiogenic, shock, PE. Evaluated by VAD team. Found to have mod - large R pleural effusion. LVAD implantation 09/22/14. Progressed well until developed hemothorax and required return to OR 4/5 and 4/6 for Redo sternotomy, reexploration of chest and removal of hematoma.   Pertinent Vitals Pain Assessment: No/denies pain  SLP Plan  Continue with current plan of care    Recommendations Diet recommendations: heart healthy - regular ;Thin liquid Liquids provided via: Cup Medication Administration: Whole meds with puree Supervision: Patient able to self feed;Intermittent supervision to cue for compensatory strategies Compensations: Slow rate;Small sips/bites;Multiple dry swallows after each bite/sip Postural Changes and/or Swallow Maneuvers: Seated upright 90 degrees;Upright 30-60 min after meal              Oral Care Recommendations: Oral care BID Follow up Recommendations: Inpatient Rehab Plan: Continue with current plan of care   Scott [name] L. Scott [name], [place] CCC/SLP Pager [phone]      Scott [name] 10/19/2014, 12:11 PM

## 2014-10-19 NOTE — Progress Notes (Signed)
CARDIAC REHAB PHASE I   PRE:  Rate/Rhythm: 86 SR  BP:  Supine:   Sitting: 80 MAP  Standing:    SaO2: 98%RA  MODE:  Ambulation: 230 ft   POST:  Rate/Rhythm: 106 ST  BP:  Supine:   Sitting: 90 MAP  Standing:    SaO2: 98% 2L 1010-1047 Pt just back from Xray and connected to batteries. Willing to walk. Pt walked 230 ft on 2L oxygen(pt's request) with rolling walker and asst x 1. Back up bag taken. Chest tube intact. Tolerated well. Did not have to sit or rest. To recliner after walk. Back to power source with help getting batteries from vest. Back to room air.    Graylon Good, RN BSN  10/19/2014 10:44 AM

## 2014-10-19 NOTE — Progress Notes (Signed)
Utilization review completed.  

## 2014-10-19 NOTE — Progress Notes (Signed)
VAD driveline dressing change done by wife with RN supervision. Sterile technique adhered to. Site without redness, drainage or swelling. Steri-strip in place.

## 2014-10-19 NOTE — Progress Notes (Signed)
PT Cancellation Note  Patient Details Name: Scott [name] MRN: [id number] DOB: 1945/07/25   Cancelled Treatment:    Reason Eval/Treat Not Completed: Patient at procedure or test/unavailable   Initiating session when RN and transporter arrived to take pt to xray. Will attempt later today   Ramelo Oetken 10/19/2014, 9:35 AM  Pager [phone]

## 2014-10-19 NOTE — Progress Notes (Signed)
Occupational Therapy Treatment Patient Details Name: Scott [name] MRN: [id number] DOB: 06-21-1946 Today's Date: 10/19/2014    History of present illness 69 y.o. M with extensive cardiac hx as well as recent PE / DVT, admitted for SOB due to heart failure, cardiogenic, shock, PE. Evaluated by VAD team. Found to have mod - large R pleural effusion. LVAD implantation 09/22/14. Progressed well until developed hemothorax and required return to OR 4/5 and 4/6 for Redo sternotomy, reexploration of chest and removal of hematoma.   OT comments  Pt performed UB bathing, UB dressing and grooming in sitting and standing at sink with set up to min assist.  He depends on his wife for pericare and LB ADL and wife is agreeable.  Educated pt and wife on benefits of pt performing as much of his ADL as possible to increase activity tolerance.   Follow Up Recommendations  Home health OT    Equipment Recommendations  3 in 1 bedside comode;Tub/shower seat    Recommendations for Other Services      Precautions / Restrictions Precautions Precautions: Sternal;Fall Precaution Comments: LVAD       Mobility Bed Mobility                  Transfers Overall transfer level: Needs assistance Equipment used: Rolling walker (2 wheeled) Transfers: Sit to/from Stand Sit to Stand: Min assist;+2 safety/equipment         General transfer comment: assist to steady and for equipment from chair and 3 in 1    Balance                                   ADL Overall ADL's : Needs assistance/impaired     Grooming: Oral care;Wash/dry hands;Wash/dry face;Sitting;Standing;Set up Grooming Details (indicate cue type and reason): sat on 3 in 1 at sink for face washing, stood to brush teeth Upper Body Bathing: Minimal assitance;Sitting Upper Body Bathing Details (indicate cue type and reason): assisted for back Lower Body Bathing: Total assistance;Sit to/from stand Lower Body Bathing Details  (indicate cue type and reason): refused to attempt, no interest in AE Upper Body Dressing : Sitting;Minimal assistance Upper Body Dressing Details (indicate cue type and reason): assist mostly for lines Lower Body Dressing: Total assistance;Sitting/lateral leans Lower Body Dressing Details (indicate cue type and reason): back limits, no interest in AE Toilet Transfer: Minimal assistance;+2 for safety/equipment;Ambulation;RW;BSC   Toileting- Clothing Manipulation and Hygiene: Total assistance;Sit to/from stand Toileting - Clothing Manipulation Details (indicate cue type and reason): refuses to attempt       General ADL Comments: Pt willing to perform groomin at sink in sitting and standing and UB bathing, otherwise requesting wife do the rest for him.      Vision                     Perception     Praxis      Cognition   Behavior During Therapy: Flat affect Overall Cognitive Status: Within Functional Limits for tasks assessed                       Extremity/Trunk Assessment               Exercises     Shoulder Instructions       General Comments      Pertinent Vitals/ Pain       Pain Assessment:  No/denies pain  Home Living                                          Prior Functioning/Environment              Frequency Min 2X/week     Progress Toward Goals  OT Goals(current goals can now be found in the care plan section)  Progress towards OT goals: Progressing toward goals  Acute Rehab OT Goals Patient Stated Goal: to go home  Time For Goal Achievement: 10/26/14 Potential to Achieve Goals: Good  Plan Discharge plan remains appropriate    Co-evaluation                 End of Session     Activity Tolerance Patient tolerated treatment well   Patient Left in chair;with call bell/phone within reach;with chair alarm set;with family/visitor present   Nurse Communication          Time: 1045-1130 OT  Time Calculation (min): 45 min  Charges: OT General Charges $OT Visit: 1 Procedure OT Treatments $Self Care/Home Management : 38-52 mins  [name] 10/19/2014, 11:54 AM  [phone]

## 2014-10-19 NOTE — Progress Notes (Signed)
Pt ref BIPAP at this time secondary to a nose bleed. RN in the room at the time. No resp distress noted, Pt denies using BIPAP at home. He will call me should he change his mind.

## 2014-10-19 NOTE — Progress Notes (Signed)
Patient had a nose bleed from right nare after "picking at a scab" per wife. She said that it bled for about half an hour. Controlled with a tissue packed into nose. Pt instructed not to pick or blow nose. Will monitor.

## 2014-10-19 NOTE — Progress Notes (Addendum)
Patient ID: Scott [name], male   DOB: February 24, 1946, 69 y.o.   MRN: [id number] HeartMate 2 Rounding Note  Subjective:    S/P HMII LVAD on 09/20/2013.   Moved to ICU  4/5 due to low MAP and low hemoglobin. Taken Back to the OR 4/5 and 4/6  for recurrent bleed and evacuation of clots from left chest.   Doing well Off milrinone. Hgb stable. Off diuretics. WBC trending up over weekend. Started on Vanc, Mero, and fluconazole. WBC now starting to fal.. Denies fever.   Denies SOB. Ambulating with PT and CR. INR 1.8  LVAD INTERROGATION:  HeartMate II LVAD:  Flow 5.8  liters/min, speed 9400, power 6, PI 4.9  No PI events.     Objective:    Vital Signs:   Temp:  [97.3 F (36.3 C)-97.5 F (36.4 C)] 97.5 F (36.4 C) (04/19 0427) Pulse Rate:  [90-99] 99 (04/19 0427) Resp:  [16-32] 16 (04/19 0427) SpO2:  [96 %-100 %] 98 % (04/19 0427) Weight:  [78.7 kg (173 lb 8 oz)] 78.7 kg (173 lb 8 oz) (04/19 0300) Last BM Date: 10/18/14 Mean arterial Pressure  92-98 Intake/Output:   Intake/Output Summary (Last 24 hours) at 10/19/14 0838 Last data filed at 10/19/14 0641  Gross per 24 hour  Intake    280 ml  Output   1385 ml  Net  -1105 ml     Physical Exam:  General: In chair.  HEENT: normal Neck: supple. Carotids 2+ bilat; no bruits. No lymphadenopathy or thryomegaly appreciated.  Cor: Mechanical heart sounds with LVAD hum present. Lungs: clear On room air.   Abdomen: soft, nontender, nondistended. No hepatosplenomegaly. No bruits or masses. Good bowel sounds. Driveline: Dressing looks good.  Extremities: no cyanosis, clubbing, rash.  No edema RUE PICC Neuro: Awake alert   Telemetry: NSR 90s    Labs: Basic Metabolic Panel:  Recent Labs Lab 10/15/14 0450 10/16/14 0330 10/17/14 0400 10/18/14 0401 10/19/14 0450  NA 144 147* 148* 149* 146*  K 3.7 3.6 3.7 3.6 3.4*  CL 102 105 108 111 108  CO2 34* 32 33* 30 30  GLUCOSE 101* 107* 114* 117* 127*  BUN 10 12 12 12 11   CREATININE 0.76  0.82 0.81 0.82 0.78  CALCIUM 8.5 8.8 8.7 8.7 8.4  MG 2.1  --   --   --   --     Liver Function Tests:  Recent Labs Lab 10/15/14 0450 10/16/14 0330 10/17/14 0400 10/18/14 0401 10/19/14 0450  AST 34 35 36 32 38*  ALT 25 30 29 29 30   ALKPHOS 284* 287* 291* 282* 277*  BILITOT 1.3* 1.0 1.0 0.9 0.7  PROT 5.5* 5.6* 5.8* 5.8* 5.4*  ALBUMIN 2.0* 2.1* 2.1* 2.1* 2.1*   No results for input(s): LIPASE, AMYLASE in the last 168 hours. No results for input(s): AMMONIA in the last 168 hours.  CBC:  Recent Labs Lab 10/15/14 0450 10/16/14 0330 10/17/14 0400 10/18/14 0401 10/19/14 0450  WBC 12.9* 16.8* 20.6* 22.9* 21.0*  HGB 8.8* 9.1* 9.3* 9.3* 9.0*  HCT 29.1* 29.8* 31.4* 30.8* 29.9*  MCV 97.0 97.1 98.4 99.0 98.7  PLT 548* 570* 594* 532* 441*    INR:  Recent Labs Lab 10/15/14 0450 10/16/14 0330 10/17/14 0400 10/18/14 0401 10/19/14 0450  INR 1.49 1.45 1.48 1.72* 1.81*    Other results:    Imaging: Dg Chest Port 1 View  10/17/2014   CLINICAL DATA:  Followup left ventricular assist device.  EXAM: PORTABLE CHEST -  1 VIEW  COMPARISON:  Yesterday.  FINDINGS: Stable enlarged cardiac silhouette, median sternotomy wires and skin clips. The left ventricular assist device is unchanged. Right PICC tip at the superior cavoatrial junction. Decreased airspace opacity in the right lower lung zone and at the left lung base.  IMPRESSION: Decreased bibasilar patchy atelectasis or pneumonia.   Electronically Signed   By: Claudie Revering M.D.   On: 10/17/2014 11:55     Medications:     Scheduled Medications: . acetaminophen (TYLENOL) oral liquid 160 mg/5 mL  650 mg Per Tube Once   Or  . acetaminophen  650 mg Rectal Once  . antiseptic oral rinse  7 mL Mouth Rinse q12n4p  . budesonide-formoterol  2 puff Inhalation BID  . chlorhexidine  15 mL Mouth Rinse BID  . citalopram  20 mg Oral Daily  . dextromethorphan  30 mg Oral BID  . fluconazole (DIFLUCAN) IV  200 mg Intravenous Q24H  .  furosemide  40 mg Oral Daily  . insulin aspart  0-15 Units Subcutaneous TID WC  . insulin aspart  0-5 Units Subcutaneous QHS  . meropenem (MERREM) IV  1 g Intravenous Q8H  . pantoprazole  40 mg Oral QHS  . sodium chloride  10 mL Intravenous Q12H  . traZODone  50 mg Oral QHS  . vancomycin  750 mg Intravenous Q8H  . warfarin  1 mg Oral ONCE-1800  . Warfarin - Physician Dosing Inpatient   Does not apply q1800    Infusions: . lactated ringers 20 mL/hr at 10/17/14 0400    PRN Medications: oxyCODONE **AND** acetaminophen, fentaNYL (SUBLIMAZE) injection, hydrALAZINE, levalbuterol, ondansetron (ZOFRAN) IV, RESOURCE THICKENUP CLEAR, sodium chloride, traMADol   Assessment:    1.  A/C systolic HF class IV with cardiogenic shock- EF 15% with moderate RV dysfunction 3/16 echo.         -S/P HMII LVAD 09/21/14  2. CAD s/p Anterior STEMI on 06/12/14 with stenting of LAD-off Plavix.  3. Acute on chronic hypercarbic respiratory failure  4. 06/12/14 VT in setting of STEMI 5. Ankylosing spondylitis. 6. Bilateral PE with R DVT- S/P IVC filter 08/27/2014--->coumadin 7. Acute Blood Loss into pleural space:  Back to OR 4/5 and 4/6 for recurrent bleed. Received multiple blood products.  8. PAF: On amiodarone.  Plan/Discussion:   S/P 09/21/2014 HMII LVAD.   Back to OR 4/5 and and again 4/6 for recurrent bleed and evacuation clots. Received multiple blood products. Hemoglobin stable 9.3.   Continues to progress. Weight unchanged. Off diuretics.    WBC now trending down. No clear source of infection. Afebrile. Blood and urine NGTD. Continue  Vanc, meropenem, and fluconazole.   On coumadin. INR 1.8. Goal INR 1.5-2.  Will not restart Plavix as anterior wall completely necrotic.   Management of chest tube per Dr. [name]. Continue VAD teaching and ambulation.   I reviewed the LVAD parameters from today, and compared the results to the patient's prior recorded data.  No programming changes were made.   The LVAD is functioning within specified parameters.  The patient performs LVAD self-test daily.  LVAD interrogation was negative for any significant power changes, alarms or PI events/speed drops.  LVAD equipment check completed and is in good working order.  Back-up equipment present.   LVAD education done on emergency procedures and precautions and reviewed exit site care.   [name] MD  8:38 AM Length of Stay: 62  VAD Team --- VAD ISSUES ONLY--- Pager [phone] (7am - 7am)  Advanced Heart Failure Team  Pager [phone] (M-F; [place])  Please contact [facility] for night-coverage after hours (4p -7a ) and weekends on [email or web address]

## 2014-10-19 NOTE — Progress Notes (Signed)
VAD coordinator spoke with pt and wife this afternoon. Informed them that plan on changing VAD exit site dressing tomorrow in order to evaluate site.    Also, plan to work with patient and caregiver this week re: discharge teaching.

## 2014-10-20 LAB — GLUCOSE, CAPILLARY
GLUCOSE-CAPILLARY: 99 mg/dL (ref 70–99)
Glucose-Capillary: 97 mg/dL (ref 70–99)
Glucose-Capillary: 144 mg/dL — ABNORMAL HIGH (ref 70–99)
Glucose-Capillary: 110 mg/dL — ABNORMAL HIGH (ref 70–99)

## 2014-10-20 LAB — LACTATE DEHYDROGENASE: LDH: 448 U/L — AB (ref 94–250)

## 2014-10-20 LAB — CBC
HCT: 29.5 % — ABNORMAL LOW (ref 39.0–52.0)
Hemoglobin: 8.9 g/dL — ABNORMAL LOW (ref 13.0–17.0)
MCH: 29.7 pg (ref 26.0–34.0)
MCHC: 30.2 g/dL (ref 30.0–36.0)
MCV: 98.3 fL (ref 78.0–100.0)
Platelets: 402 10*3/uL — ABNORMAL HIGH (ref 150–400)
RBC: 3 MIL/uL — ABNORMAL LOW (ref 4.22–5.81)
RDW: 19.2 % — ABNORMAL HIGH (ref 11.5–15.5)
WBC: 21.6 10*3/uL — ABNORMAL HIGH (ref 4.0–10.5)

## 2014-10-20 LAB — SEDIMENTATION RATE: Sed Rate: 28 mm/hr — ABNORMAL HIGH (ref 0–16)

## 2014-10-20 LAB — PROTIME-INR
INR: 1.87 — ABNORMAL HIGH (ref 0.00–1.49)
Prothrombin Time: 21.7 seconds — ABNORMAL HIGH (ref 11.6–15.2)

## 2014-10-20 MED ORDER — WARFARIN SODIUM 2 MG PO TABS
2.0000 mg | ORAL_TABLET | Freq: Once | ORAL | Status: AC
Start: 2014-10-20 — End: 2014-10-20
  Administered 2014-10-20: 2 mg via ORAL
  Filled 2014-10-20: qty 1

## 2014-10-20 MED ORDER — DEXTROSE 5 % IV SOLN
1.0000 g | Freq: Three times a day (TID) | INTRAVENOUS | Status: DC
  Administered 2014-10-20 – 2014-10-22 (×6): 1 g via INTRAVENOUS
  Filled 2014-10-20 (×8): qty 1

## 2014-10-20 MED ORDER — ACETAMINOPHEN 325 MG PO TABS
325.0000 mg | ORAL_TABLET | Freq: Four times a day (QID) | ORAL | Status: DC | PRN
  Administered 2014-10-23 – 2014-11-02 (×19): 325 mg via ORAL
  Filled 2014-10-20 (×19): qty 1

## 2014-10-20 NOTE — Progress Notes (Signed)
Patient ID: Scott [name], male   DOB: 26-Apr-1946, 69 y.o.   MRN: [id number] HeartMate 2 Rounding Note  Subjective:    S/P HMII LVAD on 09/20/2013.   Moved to ICU  4/5 due to low MAP and low hemoglobin. Taken Back to the OR 4/5 and 4/6  for recurrent bleed and evacuation of clots from left chest.   Doing well Off milrinone. Hgb stable. Off diuretics. WBC trending up over weekend. Started on Vanc, Mero, and fluconazole. WBC remains 21. Denies fever.   Denies SOB. Ambulating with PT and CR. INR 1.87 LDH 409>448  Hgb 8.9   LVAD INTERROGATION:  HeartMate II LVAD:  Flow 5.1  liters/min, speed 9400, power 6, PI 6.8  No PI events.     Objective:    Vital Signs:   Temp:  [97.7 F (36.5 C)-98 F (36.7 C)] 97.7 F (36.5 C) (04/20 0500) Pulse Rate:  [93-103] 103 (04/20 0500) Resp:  [17-18] 18 (04/20 0500) SpO2:  [95 %-99 %] 96 % (04/20 0500) Weight:  [177 lb 0.5 oz (80.3 kg)] 177 lb 0.5 oz (80.3 kg) (04/20 0500) Last BM Date: 10/19/14 Mean arterial Pressure  90-92 Intake/Output:   Intake/Output Summary (Last 24 hours) at 10/20/14 0859 Last data filed at 10/20/14 0403  Gross per 24 hour  Intake      0 ml  Output   1345 ml  Net  -1345 ml     Physical Exam:  General: In bed.   HEENT: normal. R nare packed with 2x2 Neck: supple. Carotids 2+ bilat; no bruits. No lymphadenopathy or thryomegaly appreciated.  Cor: Mechanical heart sounds with LVAD hum present. Lungs: clear On room air.   Abdomen: soft, nontender, nondistended. No hepatosplenomegaly. No bruits or masses. Good bowel sounds. Driveline: Dressing looks good.  Extremities: no cyanosis, clubbing, rash.  No edema RUE PICC Neuro: Awake alert   Telemetry: NSR 90s    Labs: Basic Metabolic Panel:  Recent Labs Lab 10/15/14 0450 10/16/14 0330 10/17/14 0400 10/18/14 0401 10/19/14 0450  NA 144 147* 148* 149* 146*  K 3.7 3.6 3.7 3.6 3.4*  CL 102 105 108 111 108  CO2 34* 32 33* 30 30  GLUCOSE 101* 107* 114* 117*  127*  BUN 10 12 12 12 11   CREATININE 0.76 0.82 0.81 0.82 0.78  CALCIUM 8.5 8.8 8.7 8.7 8.4  MG 2.1  --   --   --   --     Liver Function Tests:  Recent Labs Lab 10/15/14 0450 10/16/14 0330 10/17/14 0400 10/18/14 0401 10/19/14 0450  AST 34 35 36 32 38*  ALT 25 30 29 29 30   ALKPHOS 284* 287* 291* 282* 277*  BILITOT 1.3* 1.0 1.0 0.9 0.7  PROT 5.5* 5.6* 5.8* 5.8* 5.4*  ALBUMIN 2.0* 2.1* 2.1* 2.1* 2.1*   No results for input(s): LIPASE, AMYLASE in the last 168 hours. No results for input(s): AMMONIA in the last 168 hours.  CBC:  Recent Labs Lab 10/16/14 0330 10/17/14 0400 10/18/14 0401 10/19/14 0450 10/20/14 0512  WBC 16.8* 20.6* 22.9* 21.0* 21.6*  HGB 9.1* 9.3* 9.3* 9.0* 8.9*  HCT 29.8* 31.4* 30.8* 29.9* 29.5*  MCV 97.1 98.4 99.0 98.7 98.3  PLT 570* 594* 532* 441* 402*    INR:  Recent Labs Lab 10/16/14 0330 10/17/14 0400 10/18/14 0401 10/19/14 0450 10/20/14 0512  INR 1.45 1.48 1.72* 1.81* 1.87*    Other results:    Imaging: Dg Chest 2 View  10/19/2014   CLINICAL DATA:  Shortness of breath.  EXAM: CHEST  2 VIEW  COMPARISON:  October 17, 2014.  FINDINGS: Stable cardiomegaly. Sternotomy wires are noted. Left ventricular assistance device is again noted and unchanged in position. No pneumothorax is noted. Minimal bilateral pleural effusions are noted. Stable right lower lobe opacity is noted consistent with atelectasis or possibly pneumonia. Right-sided PICC line is unchanged in position with distal tip at the expected position of the cavoatrial junction. Stable mild left basilar opacity is noted as well.  IMPRESSION: Stable bibasilar opacities are noted concerning for pneumonia or atelectasis. Stable minimal bilateral pleural effusions are noted.   Electronically Signed   By: Marijo Conception, M.D.   On: 10/19/2014 11:12     Medications:     Scheduled Medications: . acetaminophen (TYLENOL) oral liquid 160 mg/5 mL  650 mg Per Tube Once   Or  . acetaminophen   650 mg Rectal Once  . antiseptic oral rinse  7 mL Mouth Rinse q12n4p  . budesonide-formoterol  2 puff Inhalation BID  . chlorhexidine  15 mL Mouth Rinse BID  . citalopram  20 mg Oral Daily  . dextromethorphan  30 mg Oral BID  . furosemide  40 mg Oral Daily  . insulin aspart  0-15 Units Subcutaneous TID WC  . insulin aspart  0-5 Units Subcutaneous QHS  . meropenem (MERREM) IV  1 g Intravenous Q8H  . pantoprazole  40 mg Oral QHS  . sodium chloride  10 mL Intravenous Q12H  . traZODone  50 mg Oral QHS  . vancomycin  750 mg Intravenous Q8H  . warfarin  2 mg Oral ONCE-1800  . Warfarin - Physician Dosing Inpatient   Does not apply q1800    Infusions: . lactated ringers 20 mL/hr at 10/19/14 1221    PRN Medications: oxyCODONE **AND** acetaminophen, fentaNYL (SUBLIMAZE) injection, hydrALAZINE, levalbuterol, ondansetron (ZOFRAN) IV, RESOURCE THICKENUP CLEAR, sodium chloride, traMADol   Assessment:    1.  A/C systolic HF class IV with cardiogenic shock- EF 15% with moderate RV dysfunction 3/16 echo.         -S/P HMII LVAD 09/21/14  2. CAD s/p Anterior STEMI on 06/12/14 with stenting of LAD-off Plavix.  3. Acute on chronic hypercarbic respiratory failure  4. 06/12/14 VT in setting of STEMI 5. Ankylosing spondylitis. 6. Bilateral PE with R DVT- S/P IVC filter 08/27/2014--->coumadin 7. Acute Blood Loss into pleural space:  Back to OR 4/5 and 4/6 for recurrent bleed. Received multiple blood products.  8. PAF: On amiodarone.  9. Nose Bleed.-  Plan/Discussion:   S/P 09/21/2014 HMII LVAD.   Back to OR 4/5 and and again 4/6 for recurrent bleed and evacuation clots. Received multiple blood products. Hemoglobin stable 8.9   Continues to progress. Weight up 3 pounds. Off diuretics.    WBC at 21.6  No clear source of infection. Afebrile. Blood and urine NGTD. Continue  Vanc, meropenem, and fluconazole. Consult ID.   On coumadin. INR 1.8. Goal INR 1.5-2. Watch closely had nose bleed over night.  If happens again will need ENT.   Will not restart Plavix as anterior wall completely necrotic.   Management of chest tube per Dr. [name]. Continue VAD teaching and ambulation.   I reviewed the LVAD parameters from today, and compared the results to the patient's prior recorded data.  No programming changes were made.  The LVAD is functioning within specified parameters.  The patient performs LVAD self-test daily.  LVAD interrogation was negative for any significant power changes,  alarms or PI events/speed drops.  LVAD equipment check completed and is in good working order.  Back-up equipment present.   LVAD education done on emergency procedures and precautions and reviewed exit site care.   CLEGG,AMY NP-C  8:59 AM Length of Stay: 41  VAD Team --- VAD ISSUES ONLY--- Pager [phone] (7am - 7am)  Advanced Heart Failure Team  Pager [phone] (M-F; 7a - 4p)  Please contact [facility] for night-coverage after hours (4p -7a ) and weekends on [email or web address]   Patient seen and examined with Darrick Grinder, NP. We discussed all aspects of the encounter. I agree with the assessment and plan as stated above.   Doing well. Some epistaxis overnight. Now resolved. Pocket tube pulled today. Ambulating well. White count remains elevated but no fevers. Have asked ID to see. VAD parameters stable. CXR reviewed personally and stable as well. Continue education. Give lasix as needed.   [place], [place] 9:38 AM

## 2014-10-20 NOTE — Consult Note (Addendum)
[facility]ious Disease     Reason for Consult:leukocytosis    Referring Physician: Dr. Haroldine Laws  Active Problems:   Acute on chronic systolic heart failure, NYHA class 3   Reactive thrombocytosis   Leukocytosis   Encounter for central line placement   Palliative care encounter   Weakness generalized   CHF (congestive heart failure)   LVAD (left ventricular assist device) present   PAF (paroxysmal atrial fibrillation)   Left ventricular assist device (LVAD) complication   Anxiety state   Sleep disturbance   . acetaminophen (TYLENOL) oral liquid 160 mg/5 mL  650 mg Per Tube Once   Or  . acetaminophen  650 mg Rectal Once  . antiseptic oral rinse  7 mL Mouth Rinse q12n4p  . budesonide-formoterol  2 puff Inhalation BID  . chlorhexidine  15 mL Mouth Rinse BID  . citalopram  20 mg Oral Daily  . dextromethorphan  30 mg Oral BID  . furosemide  40 mg Oral Daily  . insulin aspart  0-15 Units Subcutaneous TID WC  . insulin aspart  0-5 Units Subcutaneous QHS  . meropenem (MERREM) IV  1 g Intravenous Q8H  . pantoprazole  40 mg Oral QHS  . sodium chloride  10 mL Intravenous Q12H  . traZODone  50 mg Oral QHS  . vancomycin  750 mg Intravenous Q8H  . warfarin  2 mg Oral ONCE-1800  . Warfarin - Physician Dosing Inpatient   Does not apply q1800    Recommendations: Will narrow to cefepime for now and consider stopping antibiotics, pending cultures, now day 3 ESR   Assessment: He has leukocytosis with no obvious source of infection.  Blood cultures with ngtd, UA without WBCs or concerns, no diarrhea, no SOB or cough, incision sites look good, drive line ok, no signs of mediastinitis.  May be leukemoid reaction.    Antibiotics: Vancomycin, meropenem, fluconazole  HPI: Scott [name] is a 69 y.o. male with severe heart failure s/p LVAD placement on 0/23/3435 complicated by redo sternotomy due to left hemotherax with tamponade on 10/06/14 with persistent  leukocytosis.  Initial WBC after surgery was 13-15 except for once 17,000, then on 4/5 developed leukocytosis of 26,000.  It remained high until 4/13 dropped to 10,000.  And back up on 4/17.  Cultures have been negative including blood and urine.  He was started on vancomycin, zosyn, ceftazidime and now on vancomycin, meropenem and fluconazole.  He feels no different, no sob, no cough, no diarrhea (has been constipated), no urinary discomfort.     Review of Systems: A comprehensive review of systems was negative.  Past Medical History  Diagnosis Date  . Hyperlipidemia   . Hypertension   . Diabetes   . Nephrolithiasis   . Ankylosing spondylitis   . CAD (coronary artery disease)     a. stenting of LCx 2013; b. STEMI 06/12/14 s/p PCI to LAD complicated by post cath shock requiring IABP; VT s/p DCCV, EF 20%; c. NSTEMI 06/26/14 treated medically.  . Ischemic cardiomyopathy     a. echo 08/23/2014 EF <20%, dilated CM, mod MR/TR  . Acute on chronic respiratory failure     a. 08/2014 in setting of PE.  . Bilateral pulmonary embolism     a. 08/2014 - started on Coumadin. Retrievable IVC filter placed 08/27/14 due to RV strain and large clot burden.  . Right leg DVT     a. 08/2014.  Marland Kitchen Chronic systolic CHF (congestive heart failure)   .  Hypotension   . Hemoptysis     a. 08/2014 possibly due to pulm infarct/PE.  Marland Kitchen Pleural effusion on right 08/2014 - small  . Leukocytosis   . Carotid artery disease     a. s/p stenting.  . Ventricular tachycardia     a. 06/2014 at time of MI, s/p DCCV.  Marland Kitchen Reactive thrombocytosis 09/10/2014  . Leukocytosis 09/10/2014    History  Substance Use Topics  . Smoking status: Former Smoker    Quit date: 06/12/1973  . Smokeless tobacco: Not on file  . Alcohol Use: No    Family History  Problem Relation Age of Onset  . Hypertension Mother    No Known Allergies  OBJECTIVE: Blood pressure 91/66, pulse 103, temperature 97.7 F (36.5 C), temperature source Oral, resp.  rate 18, height _0  (1.727 m), weight 177 lb 0.5 oz (80.3 kg), SpO2 96 %. General: awake, alert, nad Skin: no rashes, incision wtihout surrounding erythema, no drainage, no crepitus at sternal site, abdominal sutures without erythema.  Drive line dressing ok.  Lungs: CTA B, anterior exam Cor: LVAD Abdomen: soft, nt, nd, +bs Ext: no edema  Microbiology: Recent Results (from the past 240 hour(s))  MRSA PCR Screening     Status: None   Collection Time: 10/11/14  1:00 PM  Result Value Ref Range Status   MRSA by PCR NEGATIVE NEGATIVE Final    Comment:        The GeneXpert MRSA Assay (FDA approved for NASAL specimens only), is one component of a comprehensive MRSA colonization surveillance program. It is not intended to diagnose MRSA infection nor to guide or monitor treatment for MRSA infections.   Clostridium Difficile by PCR     Status: None   Collection Time: 10/11/14  1:37 PM  Result Value Ref Range Status   C difficile by pcr NEGATIVE NEGATIVE Final  Culture, blood (routine x 2)     Status: None (Preliminary result)   Collection Time: 10/17/14 12:19 PM  Result Value Ref Range Status   Specimen Description BLOOD LEFT HAND  Final   Special Requests BOTTLES DRAWN AEROBIC ONLY 2.5CC  Final   Culture   Final           BLOOD CULTURE RECEIVED NO GROWTH TO DATE CULTURE WILL BE HELD FOR 5 DAYS BEFORE ISSUING A FINAL NEGATIVE REPORT Performed at [facility]    Report Status PENDING  Incomplete  Culture, blood (routine x 2)     Status: None (Preliminary result)   Collection Time: 10/17/14 12:30 PM  Result Value Ref Range Status   Specimen Description BLOOD LEFT ANTECUBITAL  Final   Special Requests BOTTLES DRAWN AEROBIC ONLY 1CC  Final   Culture   Final           BLOOD CULTURE RECEIVED NO GROWTH TO DATE CULTURE WILL BE HELD FOR 5 DAYS BEFORE ISSUING A FINAL NEGATIVE REPORT Performed at [facility]    Report Status PENDING  Incomplete    [name], Herbie [place],  [place] [facility] [email or web address] [phone] pager  [phone] cell 10/20/2014, 9:48 AM

## 2014-10-20 NOTE — Progress Notes (Signed)
Pocket tube and EPW's DC'd per order and unit protocol.  Occlusive dsg to tube site, with pull uneventful.  EPW site unremarkable, CCMD aware.  Pt understands bedrest for one hour.  VSS, see flow sheet.  Will con't plan of care.

## 2014-10-20 NOTE — Progress Notes (Signed)
[place] to offer to walk with pt. Pt just back to bed and wiped out. Looks very sleepy. Stated not up to walking at this time. Wife stated he has had rough day. Encouraged pt to walk later today when feeling better with staff. Will follow up tomorrow. Graylon Good RN BSN 10/20/2014 1:56 PM

## 2014-10-20 NOTE — Progress Notes (Signed)
Exit Care Note:       VAD dressing removed.  Remaining proximal suture is eroding from skin and was removed today. Eschar/hyperkeritanized crust tissue surrounding drive line is deeply adhered to the wound and will not separate.  Distal incision is still un-approximated but less yellow sloughed tissue.  No drainage noted on the old dressing.  The velour is fully implanted at exit site.  Dressing changed and site care performed using sterile technique with Chlora prep applicators x 2, allowed site to dry, and gauze dressing with aquacel strip applied.  Drive line anchor in place.  Driveline dressing is being changed daily per sterile technique.    Plan/Recommendations and Discharge Needs as discussed with VAD team:   1. Continue to perc lead dressing daily allowing his wife to participate.  2. D/W Dr. [name].   3. Several sutures removed from his previous chest tube sites. 4 x 4 dressings applied with tape.

## 2014-10-20 NOTE — Progress Notes (Addendum)
ANTIBIOTIC CONSULT NOTE - INITIAL  Pharmacy Consult for cefepime Indication: rule out pneumonia  No Known Allergies  Patient Measurements: Height: 5\' 8"  (172.7 cm) Weight: 177 lb 0.5 oz (80.3 kg) IBW/kg (Calculated) : 68.4   Vital Signs: Temp: 97.7 F (36.5 C) (04/20 0500) Temp Source: Oral (04/20 0500) Pulse Rate: 103 (04/20 0916) Intake/Output from previous day: 04/19 0701 - 04/20 0700 In: 120 [P.O.:120] Out: 1345 [Urine:1325; Chest Tube:20] Intake/Output from this shift: Total I/O In: 120 [P.O.:120] Out: -   Labs:  Recent Labs  10/18/14 0401 10/19/14 0450 10/20/14 0512  WBC 22.9* 21.0* 21.6*  HGB 9.3* 9.0* 8.9*  PLT 532* 441* 402*  CREATININE 0.82 0.78  --    Estimated Creatinine Clearance: 87.9 mL/min (by C-G formula based on Cr of 0.78). No results for input(s): VANCOTROUGH, VANCOPEAK, VANCORANDOM, GENTTROUGH, GENTPEAK, GENTRANDOM, TOBRATROUGH, TOBRAPEAK, TOBRARND, AMIKACINPEAK, AMIKACINTROU, AMIKACIN in the last 72 hours.   Microbiology: Recent Results (from the past 720 hour(s))  Tissue culture     Status: None   Collection Time: 09/21/14 11:31 AM  Result Value Ref Range Status   Specimen Description TISSUE PERICARDIUM  Final   Special Requests NONE  Final   Gram Stain   Final    NO WBC SEEN NO ORGANISMS SEEN Performed at [facility]    Culture   Final    NO GROWTH 2 DAYS Performed at [facility]    Report Status 09/23/2014 FINAL  Final  Body fluid culture     Status: None   Collection Time: 09/21/14  1:13 PM  Result Value Ref Range Status   Specimen Description PLEURAL  Final   Special Requests NONE  Final   Gram Stain   Final    FEW WBC PRESENT,BOTH PMN AND MONONUCLEAR NO ORGANISMS SEEN Performed at [facility]    Culture   Final    NO GROWTH 3 DAYS Performed at [facility]    Report Status 09/25/2014 FINAL  Final  AFB culture with smear     Status: None (Preliminary result)   Collection Time:  09/21/14  1:13 PM  Result Value Ref Range Status   Specimen Description CSF  Final   Special Requests NONE  Final   Acid Fast Smear   Final    NO ACID FAST BACILLI SEEN Performed at [facility]    Culture   Final    CULTURE WILL BE EXAMINED FOR 6 WEEKS BEFORE ISSUING A FINAL REPORT Performed at [facility]    Report Status PENDING  Incomplete  Culture, respiratory (NON-Expectorated)     Status: None   Collection Time: 09/21/14  5:01 PM  Result Value Ref Range Status   Specimen Description TRACHEAL ASPIRATE  Final   Special Requests Normal  Final   Gram Stain   Final    FEW WBC PRESENT,BOTH PMN AND MONONUCLEAR NO SQUAMOUS EPITHELIAL CELLS SEEN NO ORGANISMS SEEN Performed at [facility]    Culture   Final    NO GROWTH 2 DAYS Performed at [facility]    Report Status 09/24/2014 FINAL  Final  Urine culture     Status: None   Collection Time: 09/25/14  8:14 PM  Result Value Ref Range Status   Specimen Description URINE, RANDOM  Final   Special Requests NONE  Final   Colony Count NO GROWTH Performed at [facility]   Final   Culture NO GROWTH Performed at [facility]  Final   Report Status 09/27/2014 FINAL  Final  Culture, Urine     Status: None   Collection Time: 09/27/14 10:10 AM  Result Value Ref Range Status   Specimen Description URINE, CATHETERIZED  Final   Special Requests NONE  Final   Colony Count NO GROWTH Performed at [facility]   Final   Culture NO GROWTH Performed at [facility]   Final   Report Status 09/28/2014 FINAL  Final  Culture, blood (routine x 2)     Status: None   Collection Time: 10/05/14  7:46 PM  Result Value Ref Range Status   Specimen Description BLOOD LEFT ARM  Final   Special Requests BOTTLES DRAWN AEROBIC AND ANAEROBIC 10CC EACH  Final   Culture   Final    NO GROWTH 5 DAYS Performed at [facility]    Report Status 10/12/2014 FINAL  Final  Culture,  blood (routine x 2)     Status: None   Collection Time: 10/05/14  7:50 PM  Result Value Ref Range Status   Specimen Description BLOOD LEFT ARM  Final   Special Requests BOTTLES DRAWN AEROBIC AND ANAEROBIC 10CCS EACH  Final   Culture   Final    NO GROWTH 5 DAYS Performed at [facility]    Report Status 10/12/2014 FINAL  Final  Culture, respiratory (NON-Expectorated)     Status: None   Collection Time: 10/05/14  8:12 PM  Result Value Ref Range Status   Specimen Description TRACHEAL ASPIRATE  Final   Special Requests Normal  Final   Gram Stain   Final    NO WBC SEEN RARE SQUAMOUS EPITHELIAL CELLS PRESENT RARE GRAM POSITIVE COCCI IN PAIRS Performed at [facility]    Culture   Final    Non-Pathogenic Oropharyngeal-type Flora Isolated. Performed at [facility]    Report Status 10/08/2014 FINAL  Final  MRSA PCR Screening     Status: None   Collection Time: 10/11/14  1:00 PM  Result Value Ref Range Status   MRSA by PCR NEGATIVE NEGATIVE Final    Comment:        The GeneXpert MRSA Assay (FDA approved for NASAL specimens only), is one component of a comprehensive MRSA colonization surveillance program. It is not intended to diagnose MRSA infection nor to guide or monitor treatment for MRSA infections.   Clostridium Difficile by PCR     Status: None   Collection Time: 10/11/14  1:37 PM  Result Value Ref Range Status   C difficile by pcr NEGATIVE NEGATIVE Final  Culture, blood (routine x 2)     Status: None (Preliminary result)   Collection Time: 10/17/14 12:19 PM  Result Value Ref Range Status   Specimen Description BLOOD LEFT HAND  Final   Special Requests BOTTLES DRAWN AEROBIC ONLY 2.5CC  Final   Culture   Final           BLOOD CULTURE RECEIVED NO GROWTH TO DATE CULTURE WILL BE HELD FOR 5 DAYS BEFORE ISSUING A FINAL NEGATIVE REPORT Performed at [facility]    Report Status PENDING  Incomplete  Culture, blood (routine x 2)     Status:  None (Preliminary result)   Collection Time: 10/17/14 12:30 PM  Result Value Ref Range Status   Specimen Description BLOOD LEFT ANTECUBITAL  Final   Special Requests BOTTLES DRAWN AEROBIC ONLY [place]  Final   Culture   Final  BLOOD CULTURE RECEIVED NO GROWTH TO DATE CULTURE WILL BE HELD FOR 5 DAYS BEFORE ISSUING A FINAL NEGATIVE REPORT Performed at [facility]    Report Status PENDING  Incomplete   Assessment: 43 YOM s/p LVAD and s/p PNA who has completed several courses of antibiotics (see details below), who has rising WBC. Today, WBC 21.6, no other source of leukocytosis noted (ie no steroids), afebrile.   vanc 3/17 >>3/21, resume 4/5>>4/14 resume 4/18>>4/20 3/26 VT 14.7 4/8 VT 16 on 1g q12 ceftaz 3/17>> 3/22; 3/24>> 3/28 Zinacef 3/22>>3/24 [x 48 hrs] rifampin 3/22 x2 complete augmentin 3/28>>3/29 zosyn 3/29 >> 4/5, resume 4/6>>4/14 Merrem 4/17>> 4/20 4/17 Fluconazole > (x 3 doses)  Per ID recommendations, to narrow to cefepime. SCr 0.78, est CrCl ~85-72mL/min.  Also on chronic Coumadin for LVAD.  INR goal 1.5-2 given recent bleeding. Patient noted to have significant nose bleed overnight (~8h) looks like only packing was used. Appears to be resolved this am. INR up to 1.82 this am. Will follow dosing closely. CVTS dosing warfarin.  Goal of Therapy:  Proper dosing of antibiotics based on hepatic and renal function INR goal 1.5-2 per MD  Plan:  -cefepime 1g IV q8h -follow clinical progression, further ID recommendations regarding LOT -follow renal function -Warfarin 2mg  per MD tonight  [name] D. Bajbus, PharmD, BCPS Clinical Pharmacist Pager: [phone] 10/20/2014 10:31 AM

## 2014-10-20 NOTE — Progress Notes (Signed)
Patient had blood tainted phlegm after coughing. MD contacted. Per MD, possibly due to post nasal drainage from previous nose bleed, no new orders at this time. Blood will be drawn and checked  in the morning. However if there is any active bleeding RN should call MD. Nose bleed stopped, no active bleeding at this time. Patient encouraged not to blow nose. Will continue to monitor.

## 2014-10-20 NOTE — Progress Notes (Signed)
CSW met with wife and patient at bedside. Patient states he is not feeling as well as he did yesterday mostly due to "some medication I had today and I didn't sleep last night due to a bloody nose". Patient and wife report continued improvement with increased walking distance. Wife states confidence with LVAD training and denies any concerns at the moment. CSW will continue to follow throughout recovery. Raquel Sarna, [place]

## 2014-10-21 ENCOUNTER — Encounter (HOSPITAL_COMMUNITY): Payer: Self-pay | Admitting: *Deleted

## 2014-10-21 ENCOUNTER — Inpatient Hospital Stay: Payer: Medicare Other | Admitting: Pulmonary Disease

## 2014-10-21 ENCOUNTER — Inpatient Hospital Stay (HOSPITAL_COMMUNITY): Payer: Medicare Other

## 2014-10-21 ENCOUNTER — Encounter (HOSPITAL_COMMUNITY): Payer: Self-pay | Admitting: Radiology

## 2014-10-21 LAB — GLUCOSE, CAPILLARY
GLUCOSE-CAPILLARY: 115 mg/dL — AB (ref 70–99)
GLUCOSE-CAPILLARY: 97 mg/dL (ref 70–99)
Glucose-Capillary: 104 mg/dL — ABNORMAL HIGH (ref 70–99)
Glucose-Capillary: 92 mg/dL (ref 70–99)

## 2014-10-21 LAB — PROTIME-INR
INR: 1.77 — ABNORMAL HIGH (ref 0.00–1.49)
PROTHROMBIN TIME: 20.8 s — AB (ref 11.6–15.2)

## 2014-10-21 LAB — BASIC METABOLIC PANEL
Anion gap: 8 (ref 5–15)
BUN: 12 mg/dL (ref 6–23)
CO2: 31 mmol/L (ref 19–32)
Calcium: 8.3 mg/dL — ABNORMAL LOW (ref 8.4–10.5)
Chloride: 104 mmol/L (ref 96–112)
Creatinine, Ser: 0.77 mg/dL (ref 0.50–1.35)
GFR calc Af Amer: >90 mL/min (ref >90)
GFR calc non Af Amer: >90 mL/min (ref >90)
Glucose, Bld: 101 mg/dL — ABNORMAL HIGH (ref 70–99)
Potassium: 3.3 mmol/L — ABNORMAL LOW (ref 3.5–5.1)
Sodium: 143 mmol/L (ref 135–145)

## 2014-10-21 LAB — CBC
HCT: 28.1 % — ABNORMAL LOW (ref 39.0–52.0)
Hemoglobin: 8.8 g/dL — ABNORMAL LOW (ref 13.0–17.0)
MCH: 30.6 pg (ref 26.0–34.0)
MCHC: 31.3 g/dL (ref 30.0–36.0)
MCV: 97.6 fL (ref 78.0–100.0)
Platelets: 363 10*3/uL (ref 150–400)
RBC: 2.88 MIL/uL — ABNORMAL LOW (ref 4.22–5.81)
RDW: 19.1 % — ABNORMAL HIGH (ref 11.5–15.5)
WBC: 18.1 10*3/uL — ABNORMAL HIGH (ref 4.0–10.5)

## 2014-10-21 LAB — LACTATE DEHYDROGENASE: LDH: 408 U/L — ABNORMAL HIGH (ref 94–250)

## 2014-10-21 MED ORDER — POTASSIUM CHLORIDE CRYS ER 20 MEQ PO TBCR
40.0000 meq | EXTENDED_RELEASE_TABLET | Freq: Two times a day (BID) | ORAL | Status: AC
  Administered 2014-10-21 (×2): 40 meq via ORAL
  Filled 2014-10-21 (×2): qty 2

## 2014-10-21 MED ORDER — WARFARIN SODIUM 2 MG PO TABS
2.0000 mg | ORAL_TABLET | Freq: Every day | ORAL | Status: AC
Start: 2014-10-21 — End: 2014-10-21
  Administered 2014-10-21: 2 mg via ORAL
  Filled 2014-10-21: qty 1

## 2014-10-21 NOTE — Progress Notes (Signed)
Speech Language Pathology Treatment: Dysphagia  Patient Details Name: Scott [name] MRN: [id number] DOB: [birth year] Today's Date: 10/21/2014 Time: 4709-6283 SLP Time Calculation (min) (ACUTE ONLY): 10 min  Assessment / Plan / Recommendation Clinical Impression  Pt has made excellent gains with swallow function.  Is now tolerating a regular consistency diet with thin liquids; following basic precautions - lungs are clear.  Swallowing goal met.  No further SLP f/u warranted; education complete with pt and wife.  SLP to sign off.    HPI HPI: 69 y.o. M with extensive cardiac hx as well as recent PE / DVT, admitted for SOB due to heart failure, cardiogenic, shock, PE. Evaluated by VAD team. Found to have mod - large R pleural effusion. LVAD implantation 09/22/14. Progressed well until developed hemothorax and required return to OR 4/5 and 4/6 for Redo sternotomy, reexploration of chest and removal of hematoma.   Pertinent Vitals Pain Assessment: No/denies pain  SLP Plan  All goals met    Recommendations Diet recommendations: Regular;Thin liquid Liquids provided via: Cup Medication Administration: Whole meds with puree Supervision: Patient able to self feed Compensations: Slow rate;Small sips/bites Postural Changes and/or Swallow Maneuvers: Seated upright 90 degrees;Upright 30-60 min after meal              Oral Care Recommendations: Oral care BID Plan: All goals met    GO     Scott [name] 10/21/2014, 3:28 PM

## 2014-10-21 NOTE — Progress Notes (Signed)
Patient ID: Scott [name], male   DOB: 1946-04-17, 69 y.o.   MRN: [id number] HeartMate 2 Rounding Note  Subjective:    S/P HMII LVAD on 09/20/2013.   Senn by ID. Abx reduced. WBC coming down. No fevers or chiils. Feels good. Ambulating. Did not get Bipap last night.   LVAD INTERROGATION:  HeartMate II LVAD:  Flow 5.6  liters/min, speed 9400, power 5.9, PI 5.3  No PI events.     Objective:    Vital Signs:   Temp:  [97.3 F (36.3 C)-97.9 F (36.6 C)] 97.3 F (36.3 C) (04/20 2003) Pulse Rate:  [100-102] 102 (04/20 2003) SpO2:  [97 %] 97 % (04/20 2105) Last BM Date: 10/20/14 Mean arterial Pressure  82-90 Intake/Output:   Intake/Output Summary (Last 24 hours) at 10/21/14 1101 Last data filed at 10/20/14 1858  Gross per 24 hour  Intake    370 ml  Output    600 ml  Net   -230 ml     Physical Exam:  General: In bed.   HEENT: normal. R nare packed with 2x2 Neck: supple. Carotids 2+ bilat; no bruits. No lymphadenopathy or thryomegaly appreciated.  Cor: Mechanical heart sounds with LVAD hum present. Lungs: clear On room air.   Abdomen: soft, nontender, nondistended. No hepatosplenomegaly. No bruits or masses. Good bowel sounds. Driveline: Dressing looks good.  Extremities: no cyanosis, clubbing, rash.  No edema RUE PICC Neuro: Awake alert   Telemetry: NSR 90s    Labs: Basic Metabolic Panel:  Recent Labs Lab 10/15/14 0450 10/16/14 0330 10/17/14 0400 10/18/14 0401 10/19/14 0450 10/21/14 0450  NA 144 147* 148* 149* 146* 143  K 3.7 3.6 3.7 3.6 3.4* 3.3*  CL 102 105 108 111 108 104  CO2 34* 32 33* 30 30 31   GLUCOSE 101* 107* 114* 117* 127* 101*  BUN 10 12 12 12 11 12   CREATININE 0.76 0.82 0.81 0.82 0.78 0.77  CALCIUM 8.5 8.8 8.7 8.7 8.4 8.3*  MG 2.1  --   --   --   --   --     Liver Function Tests:  Recent Labs Lab 10/15/14 0450 10/16/14 0330 10/17/14 0400 10/18/14 0401 10/19/14 0450  AST 34 35 36 32 38*  ALT 25 30 29 29 30   ALKPHOS 284* 287* 291*  282* 277*  BILITOT 1.3* 1.0 1.0 0.9 0.7  PROT 5.5* 5.6* 5.8* 5.8* 5.4*  ALBUMIN 2.0* 2.1* 2.1* 2.1* 2.1*   No results for input(s): LIPASE, AMYLASE in the last 168 hours. No results for input(s): AMMONIA in the last 168 hours.  CBC:  Recent Labs Lab 10/17/14 0400 10/18/14 0401 10/19/14 0450 10/20/14 0512 10/21/14 0450  WBC 20.6* 22.9* 21.0* 21.6* 18.1*  HGB 9.3* 9.3* 9.0* 8.9* 8.8*  HCT 31.4* 30.8* 29.9* 29.5* 28.1*  MCV 98.4 99.0 98.7 98.3 97.6  PLT 594* 532* 441* 402* 363    INR:  Recent Labs Lab 10/17/14 0400 10/18/14 0401 10/19/14 0450 10/20/14 0512 10/21/14 0450  INR 1.48 1.72* 1.81* 1.87* 1.77*    Other results:    Imaging: No results found.   Medications:     Scheduled Medications: . antiseptic oral rinse  7 mL Mouth Rinse q12n4p  . budesonide-formoterol  2 puff Inhalation BID  . ceFEPime (MAXIPIME) IV  1 g Intravenous 3 times per day  . chlorhexidine  15 mL Mouth Rinse BID  . citalopram  20 mg Oral Daily  . dextromethorphan  30 mg Oral BID  . furosemide  40 mg Oral Daily  . insulin aspart  0-15 Units Subcutaneous TID WC  . insulin aspart  0-5 Units Subcutaneous QHS  . pantoprazole  40 mg Oral QHS  . potassium chloride  40 mEq Oral BID  . sodium chloride  10 mL Intravenous Q12H  . traZODone  50 mg Oral QHS  . warfarin  2 mg Oral q1800  . Warfarin - Physician Dosing Inpatient   Does not apply q1800    Infusions: . lactated ringers 20 mL/hr at 10/19/14 1221    PRN Medications: acetaminophen, fentaNYL (SUBLIMAZE) injection, hydrALAZINE, levalbuterol, ondansetron (ZOFRAN) IV, RESOURCE THICKENUP CLEAR, sodium chloride, traMADol   Assessment:    1.  A/C systolic HF class IV with cardiogenic shock- EF 15% with moderate RV dysfunction 3/16 echo.         -S/P HMII LVAD 09/21/14  2. CAD s/p Anterior STEMI on 06/12/14 with stenting of LAD-off Plavix.  3. Acute on chronic hypercarbic respiratory failure  4. 06/12/14 VT in setting of  STEMI 5. Ankylosing spondylitis. 6. Bilateral PE with R DVT- S/P IVC filter 08/27/2014--->coumadin 7. Acute Blood Loss into pleural space:  Back to OR 4/5 and 4/6 for recurrent bleed. Received multiple blood products.  8. PAF: On amiodarone.  9. Nose Bleed.-  Plan/Discussion:   S/P 09/21/2014 HMII LVAD.   Doing well. Appreciate ID input. WBC decreasing.  Volume status looks good. Supp K+.   Needs nightly BiPAP  Continue teaching. Home Monday. Ramp echo tomorrow.   I reviewed the LVAD parameters from today, and compared the results to the patient's prior recorded data.  No programming changes were made.  The LVAD is functioning within specified parameters.  The patient performs LVAD self-test daily.  LVAD interrogation was negative for any significant power changes, alarms or PI events/speed drops.  LVAD equipment check completed and is in good working order.  Back-up equipment present.   LVAD education done on emergency procedures and precautions and reviewed exit site care.   [name] MD 11:01 AM Length of Stay: 11  VAD Team --- VAD ISSUES ONLY--- Pager [phone] (7am - 7am)  Advanced Heart Failure Team  Pager [phone] (M-F; 7a - 4p)  Please contact [facility] for night-coverage after hours (4p -7a ) and weekends on [email or web address]

## 2014-10-21 NOTE — Progress Notes (Signed)
Patient ID: Scott [name], male   DOB: September 03, 1945, 69 y.o.   MRN: [id number] HeartMate 2 Rounding Note  Subjective:    S/P HMII LVAD on 09/20/2013.   Moved to ICU  4/5 due to low MAP and low hemoglobin. Taken Back to the OR 4/5 and 4/6  for recurrent bleed and evacuation of clots from left chest.   Doing well Off milrinone. Hgb stable. Off diuretics. WBC trending up over weekend. Started on Vanc, Mero, and fluconazole. Yesterday antibiotics narrowed to cefepime.  WBC down to 18.    Denies SOB. Ambulating with PT and CR.  INR 1.77 LDH 409>448 >408 Hgb 8.9>8.8   LVAD INTERROGATION:  HeartMate II LVAD:  Flow 5.1  liters/min, speed 9400, power 6, PI 6.8  No PI events.     Objective:    Vital Signs:   Temp:  [97.3 F (36.3 C)-97.9 F (36.6 C)] 97.3 F (36.3 C) (04/20 2003) Pulse Rate:  [100-102] 102 (04/20 2003) SpO2:  [97 %] 97 % (04/20 2105) Last BM Date: 10/20/14 Mean arterial Pressure  82 Intake/Output:   Intake/Output Summary (Last 24 hours) at 10/21/14 1258 Last data filed at 10/21/14 1248  Gross per 24 hour  Intake    370 ml  Output   1100 ml  Net   -730 ml     Physical Exam:  General: In bed.   HEENT: normal. R nare packed with 2x2 Neck: supple. Carotids 2+ bilat; no bruits. No lymphadenopathy or thryomegaly appreciated.  Cor: Mechanical heart sounds with LVAD hum present. Lungs: clear On room air.   Abdomen: soft, nontender, nondistended. No hepatosplenomegaly. No bruits or masses. Good bowel sounds. Driveline: Dressing looks good.  Extremities: no cyanosis, clubbing, rash.  No edema RUE PICC Neuro: Awake alert   Telemetry: NSR 90s    Labs: Basic Metabolic Panel:  Recent Labs Lab 10/15/14 0450 10/16/14 0330 10/17/14 0400 10/18/14 0401 10/19/14 0450 10/21/14 0450  NA 144 147* 148* 149* 146* 143  K 3.7 3.6 3.7 3.6 3.4* 3.3*  CL 102 105 108 111 108 104  CO2 34* 32 33* 30 30 31   GLUCOSE 101* 107* 114* 117* 127* 101*  BUN 10 12 12 12 11 12    CREATININE 0.76 0.82 0.81 0.82 0.78 0.77  CALCIUM 8.5 8.8 8.7 8.7 8.4 8.3*  MG 2.1  --   --   --   --   --     Liver Function Tests:  Recent Labs Lab 10/15/14 0450 10/16/14 0330 10/17/14 0400 10/18/14 0401 10/19/14 0450  AST 34 35 36 32 38*  ALT 25 30 29 29 30   ALKPHOS 284* 287* 291* 282* 277*  BILITOT 1.3* 1.0 1.0 0.9 0.7  PROT 5.5* 5.6* 5.8* 5.8* 5.4*  ALBUMIN 2.0* 2.1* 2.1* 2.1* 2.1*   No results for input(s): LIPASE, AMYLASE in the last 168 hours. No results for input(s): AMMONIA in the last 168 hours.  CBC:  Recent Labs Lab 10/17/14 0400 10/18/14 0401 10/19/14 0450 10/20/14 0512 10/21/14 0450  WBC 20.6* 22.9* 21.0* 21.6* 18.1*  HGB 9.3* 9.3* 9.0* 8.9* 8.8*  HCT 31.4* 30.8* 29.9* 29.5* 28.1*  MCV 98.4 99.0 98.7 98.3 97.6  PLT 594* 532* 441* 402* 363    INR:  Recent Labs Lab 10/17/14 0400 10/18/14 0401 10/19/14 0450 10/20/14 0512 10/21/14 0450  INR 1.48 1.72* 1.81* 1.87* 1.77*    Other results:    Imaging: No results found.   Medications:     Scheduled Medications: . antiseptic  oral rinse  7 mL Mouth Rinse q12n4p  . budesonide-formoterol  2 puff Inhalation BID  . ceFEPime (MAXIPIME) IV  1 g Intravenous 3 times per day  . chlorhexidine  15 mL Mouth Rinse BID  . citalopram  20 mg Oral Daily  . dextromethorphan  30 mg Oral BID  . furosemide  40 mg Oral Daily  . insulin aspart  0-15 Units Subcutaneous TID WC  . insulin aspart  0-5 Units Subcutaneous QHS  . pantoprazole  40 mg Oral QHS  . potassium chloride  40 mEq Oral BID  . sodium chloride  10 mL Intravenous Q12H  . traZODone  50 mg Oral QHS  . warfarin  2 mg Oral q1800  . Warfarin - Physician Dosing Inpatient   Does not apply q1800    Infusions: . lactated ringers 20 mL/hr at 10/19/14 1221    PRN Medications: acetaminophen, fentaNYL (SUBLIMAZE) injection, hydrALAZINE, levalbuterol, ondansetron (ZOFRAN) IV, RESOURCE THICKENUP CLEAR, sodium chloride, traMADol   Assessment:     1.  A/C systolic HF class IV with cardiogenic shock- EF 15% with moderate RV dysfunction 3/16 echo.         -S/P HMII LVAD 09/21/14  2. CAD s/p Anterior STEMI on 06/12/14 with stenting of LAD-off Plavix.  3. Acute on chronic hypercarbic respiratory failure  4. 06/12/14 VT in setting of STEMI 5. Ankylosing spondylitis. 6. Bilateral PE with R DVT- S/P IVC filter 08/27/2014--->coumadin 7. Acute Blood Loss into pleural space:  Back to OR 4/5 and 4/6 for recurrent bleed. Received multiple blood products.  8. PAF: On amiodarone.  9. Nose Bleed.-  Plan/Discussion:   S/P 09/21/2014 HMII LVAD.   Back to OR 4/5 and and again 4/6 for recurrent bleed and evacuation clots. Received multiple blood products. Hemoglobin stable 8.8  Continues to progress. Weight up 3 pounds. Off diuretics.    WBC at 21.6 >18. ID appreciated. Antibiotics narrowed.  On cefepime  On coumadin. INR 1.7. Goal INR 1.5-2. Watch closely had nose bleed over night. If happens again will need ENT.   Will not restart Plavix as anterior wall completely necrotic.    Continue VAD teaching and ambulation. Home Health Orders placed for d/c   Anticipate d/c Monday     I reviewed the LVAD parameters from today, and compared the results to the patient's prior recorded data.  No programming changes were made.  The LVAD is functioning within specified parameters.  The patient performs LVAD self-test daily.  LVAD interrogation was negative for any significant power changes, alarms or PI events/speed drops.  LVAD equipment check completed and is in good working order.  Back-up equipment present.   LVAD education done on emergency procedures and precautions and reviewed exit site care.   CLEGG,AMY NP-C  12:58 PM Length of Stay: 10  VAD Team --- VAD ISSUES ONLY--- Pager [phone] (7am - 7am)  Advanced Heart Failure Team  Pager [phone] (M-F; 7a - 4p)  Please contact [facility] for night-coverage after hours (4p -7a ) and weekends  on [email or web address]   Patient seen and examined with Darrick Grinder, NP. We discussed all aspects of the encounter. I agree with the assessment and plan as stated above.   See my note from earlier today.   Latif Bensimhon,MD 11:56 PM

## 2014-10-21 NOTE — Progress Notes (Signed)
Medicare Important Message given? YES  (If response is "NO", the following Medicare IM given date fields will be blank)  Date Medicare IM given: 10/21/14 Medicare IM given by:  Sage Hammill  

## 2014-10-21 NOTE — Progress Notes (Signed)
Occupational Therapy Treatment Patient Details Name: Scott [name] MRN: [id number] DOB: [birth year] Today's Date: 10/21/2014    History of present illness 69 y.o. M with extensive cardiac hx as well as recent PE / DVT, admitted for SOB due to heart failure, cardiogenic, shock, PE. Evaluated by VAD team. Found to have mod - large R pleural effusion. LVAD implantation 09/22/14. Progressed well until developed hemothorax and required return to OR 4/5 and 4/6 for Redo sternotomy, reexploration of chest and removal of hematoma.   OT comments  This 69 yo male admitted with above making progress with his endurance and reports right doing better. He will continue to benefit from acute OT.  Follow Up Recommendations  Home health OT    Equipment Recommendations  3 in 1 bedside comode;Tub/shower seat       Precautions / Restrictions Precautions Precautions: Sternal;Fall Precaution Comments: LVAD Restrictions Weight Bearing Restrictions: Yes (due to sternal precautions)       Mobility Bed Mobility Overal bed mobility: Modified Independent Bed Mobility: Sit to Supine       Sit to supine: Modified independent (Device/Increase time)   General bed mobility comments: not tested, up in the recliner  Transfers Overall transfer level: Needs assistance Equipment used: Rolling walker (2 wheeled) Transfers: Sit to/from Stand Sit to Stand: Min assist (and VCs for sternal precautions)         General transfer comment: reinforced hand in lap    Balance Overall balance assessment: Needs assistance Sitting-balance support: No upper extremity supported Sitting balance-Leahy Scale: Fair     Standing balance support: No upper extremity supported Standing balance-Leahy Scale: Fair                     ADL Overall ADL's : Needs assistance/impaired                         Toilet Transfer: Supervision/safety;Ambulation;RW (chair>around circle on 2W>bed)                               Cognition   Behavior During Therapy: WFL for tasks assessed/performed Overall Cognitive Status: Within Functional Limits for tasks assessed       Memory:  (VCs needed for sternal precautions for sit<>stand)                 Exercises Other Exercises Other Exercises: Pt reports that RUE still feels weak but better than it was. Offerred to go over some exercises with him, but he politely declined stating that I am just going to use it and it will get better since it already is better than it was. Wife reports that he has been able to use it for change to battery packs for LVAD without issue.            Pertinent Vitals/ Pain       Pain Assessment: No/denies pain         Frequency Min 2X/week     Progress Toward Goals  OT Goals(current goals can now be found in the care plan section)  Progress towards OT goals: Progressing toward goals  Acute Rehab OT Goals Patient Stated Goal: to go home   Plan Discharge plan remains appropriate       End of Session Equipment Utilized During Treatment: Rolling walker   Activity Tolerance Patient tolerated treatment well (Pt reported using O2 with ambulation; however we did  not use it this session and he was 99% post ambulation on RA; pt did report however it is like his security blanket--that he feels better on it.)   Patient Left in bed (with getting his LVAD bandage changed)           Time: 1601-0932 OT Time Calculation (min): 17 min  Charges: OT General Charges $OT Visit: 1 Procedure OT Treatments $Therapeutic Activity: 8-22 mins  Almon Register [phone] 10/21/2014, 4:11 PM

## 2014-10-21 NOTE — Progress Notes (Signed)
Physical Therapy Treatment Patient Details Name: Scott [name] MRN: [id number] DOB: [birth year] Today's Date: 10/21/2014    History of Present Illness 69 y.o. M with extensive cardiac hx as well as recent PE / DVT, admitted for SOB due to heart failure, cardiogenic, shock, PE. Evaluated by VAD team. Found to have mod - large R pleural effusion. LVAD implantation 09/22/14. Progressed well until developed hemothorax and required return to OR 4/5 and 4/6 for Redo sternotomy, reexploration of chest and removal of hematoma.    PT Comments    Pt much improved, following precautions with minimal reinforcement.  Wife and pt well versed on LVAD basics.  Follow Up Recommendations  Home health PT;Supervision/Assistance - 24 hour     Equipment Recommendations  Other (comment)    Recommendations for Other Services       Precautions / Restrictions Precautions Precautions: Sternal;Fall Precaution Comments: LVAD Restrictions Weight Bearing Restrictions: Yes    Mobility  Bed Mobility               General bed mobility comments: not tested, up in the recliner  Transfers Overall transfer level: Needs assistance Equipment used: Rolling walker (2 wheeled) Transfers: Sit to/from Stand Sit to Stand: Supervision         General transfer comment: reinforced hand in lap  Ambulation/Gait Ambulation/Gait assistance: Supervision Ambulation Distance (Feet): 160 Feet Assistive device: Rolling walker (2 wheeled) Gait Pattern/deviations: Step-through pattern Gait velocity: moderate speed Gait velocity interpretation: at or above normal speed for age/gender General Gait Details: light to moderate use of the RW.  Cues for postural adjustment.  Vitals stable on RA   Stairs            Wheelchair Mobility    Modified Rankin (Stroke Patients Only)       Balance Overall balance assessment: Needs assistance Sitting-balance support: No upper extremity supported Sitting  balance-Leahy Scale: Fair     Standing balance support: No upper extremity supported Standing balance-Leahy Scale: Fair                      Cognition Arousal/Alertness: Awake/alert Behavior During Therapy: WFL for tasks assessed/performed Overall Cognitive Status: Within Functional Limits for tasks assessed                      Exercises      General Comments General comments (skin integrity, edema, etc.): Wife reminded me that she knew that you are supposed to pack and take the back up module and batteries..      Pertinent Vitals/Pain Pain Assessment: No/denies pain    Home Living                      Prior Function            PT Goals (current goals can now be found in the care plan section) Acute Rehab PT Goals Patient Stated Goal: to go home  PT Goal Formulation: With patient Time For Goal Achievement: 10/25/14 Potential to Achieve Goals: Good Progress towards PT goals: Progressing toward goals    Frequency  Min 3X/week    PT Plan Current plan remains appropriate    Co-evaluation             End of Session Equipment Utilized During Treatment: Oxygen Activity Tolerance: Patient limited by fatigue Patient left: with call bell/phone within reach;with family/visitor present;in bed     Time: 6387-5643 PT Time Calculation (min) (ACUTE ONLY):  15 min  Charges:  $Gait Training: 8-22 mins                    G Codes:      Retaj Hilbun, [name] 10/21/2014, 12:52 PM 10/21/2014  Donnella Sham, PT [phone]  (pager)

## 2014-10-21 NOTE — Progress Notes (Signed)
Admitted 09/10/14 with worsening heart failure and cardiogenic shock.  HeartMate II LVAD implated on 09/21/14 by Dr. Darcey Nora as DT VAD.   Vital signs: Temp: 97.3 - 97.9 deg F HR:  100 - 102  ST Doppler MAP:  82 - 108 O2 Sat:  97%   Wt in lbs:  173 > ... 204 > 192 > 188 > 186 > 188 > 181 > 181 > 179 > 173 > 173 > 173 > 177  LVAD interrogation reveals:  Speed:  9400 Flow: 5.6 Power: 6.0 PI: 5.3 Alarms: none Events: none Fixed speed:  9400 Low speed limit: 8800  Blood products: OR:   09/21/14:  1 unit PC, 2 plts, 3 FFP 10/05/14:  3 PC's, 500 cell saver 10/06/14:  2 PCs, 2 cryo, 300 cell saver  ICU:  09/22/14:   1 unit PC 10/05/14:  2 PCs, 2 FFP 10/06/14:  5 PCs, 2 FFP, 1 Plt, 1 cryo, 3 Factor VII, DDAVP  Nitric Oxide: off  10/11/14  Extubated: 10/10/14 and tolerating BiPAP overnight   Current gtts: Off   ASA 81 mg:  Last dose (81 mg) 10/11/14  Warfarin:  INR goal 1.5 - 2.0  Labs:  LDH trend: 301>332>340>394>398>420>414>409>448>408  INR trend: 1.72>1.81>1.87>1.77  WBC:  12.9>16.8>20.6>22.9>21.0>21.6>18.1  Hgb: 8.8>9.1>9.3>9.3>9.0>8.9>8.8   Drive Line:   VAD dressing removed and site care performed using sterile technique. Drive line exit site cleaned with Chlora prep applicators x 2, allowed to dry, and gauze dressing with aquacel strip re-applied. Exit site with very little tissue ingrowth noted. Less eschar/hyperkeritanized crust tissue around drive line, seems to be diminishing with aggressive cleansing of site. Distal incision is still un-approximated with small amount yellow drainage noted; no drainage noted on the old dressing; no foul odor noted.   Discharge VAD teaching completed with patient and wife:  The patient has completed a home inspection checklist, this has been reviewed and no unsafe conditions have been identified.  Family has ensured two dedicated grounded, 3-prong outlets with clearly labeled circuit breaker have been established in the bedroom for power  module and Charity fundraiser.  Both patient and caregivers have been trained on the following:  ____ Overview of major warnings and cautions  ____ HM II LVAD overview of system operation  ____    Overview of system components (features and functions) ____ Changing power source ____ Overview of alerts and alarms ____ How to identify an emergency ____ How to handle an emergency including when the pump is running and when the pump has stopped  ____ Changing system controller ____ Maintain emergency contact list  The patient and caregivers have successfully demonstrated:  ____ Changing power source (from batteries to power module, power module to  batteries, and replacing batteries) ____ Perform system controller self test  ____ Perform power module self test  ____ Check and charge batteries  ____ Change system controller  A daily flow sheet with patient  weight, temperature,  flow, speed, power, and PI, along with daily self checks on system controller and power module have been performed by patient and wife during hospitalization and will also be done daily at home.   The caregiver has been trained on percutaneous lead exit site care and dressing changes and has performed dressing changes during patient's hospitalization under nursing supervision. The importance of lead immobilization has been stressed to patient and caregiver using the attachment device. The caregiver has successfully demonstrated the following: ____ Cleansing ____ Dressing ____ Immobilizing lead  The following routine  activities and maintenance have been reviewed with patient and caregiver and both verbalize understanding:  ____ Stressed importance of never disconnecting power from both controller power leads at the same time, and never disconnecting both batteries at the same time, or the pump will stop ____ Plug the power module (PM) or the universal Charity fundraiser (UBC) into properly grounded (3 prong) outlets  dedicated to PM use. Do NOT use adapter (cheater plug) for ungrounded outlets or multiple portable socket outlets (power strips) ____ Do not connect the PM or UBC to an outlet controlled by wall switch or the device may not work ____ The PM's internal battery (that provides limited backup power to the LVAD in the event of AC mains power failure) remains charged as long as the PM is connected to [facility] or DC power ____ The PM's internal battery provides approximately 30 minutes of emergency backup power for the LVAD ____ Transfer from PM to batteries during [facility] mains power failure. The PM has internal backup battery that will power the pump while you transfer to batteries ____ Keep a backup system controller, charged batteries, battery clips, and             flashlight near you during sleep in case of electrical power outage ____ Clean battery, battery clip, and universal battery charger contacts weekly ____ Visually inspect percutaneous lead daily ____ Check cables and connectors when changing power source  ____    Rotate batteries; keep all eight batteries charged ____ Always have backup system controller, battery clips, fully charged batteries, and spare fully charged batteries when traveling ____ Re-calibrate batteries every 70 uses; monitor battery life of 36 months or 360 uses; replace batteries at end of battery life   Identified the following changes in activities of daily living with pump:  ____ No driving for at least six weeks and then only if doctor gives    permission to do so ____ No tub baths while pump implanted, and shower only if doctor gives    permission ____    No swimming or submersion in water while implanted with pump ____    Keep all VAD equipment away from water or moisture ____ Keep all VAD connections clean and dry ____ No contact sports or engage in jumping activities ____ Avoid strong static electricity (touching TV/computer screens, vacuuming) ____ Never have an MRI  while implanted with the pump ____ Never leave or store batteries in extremely hot or cold places (such as   trunk of your car), or the battery life will be shortened ____ Call the doctor or hospital contact person if any change in how the pump            sounds, feels, or works ____ Plan to sleep only when connected to the power module. ____    Keep a backup system controller, charged batteries, battery clips, and             flashlight near you during sleep in case of electrical power outage ____ Do not sleep on your stomach ____ Talk with doctor before any long distance travel plans ____ Patient will need antibiotics prior to any dental procedure; instructed to contact VAD coordinator before any dental procedures (including routine cleaning)  Discharge binder given to patient and include the following:  ____ Discharge instructions sheet ____ List of emergency contacts ____ Vanna [place] card ____    HM II Luggage tags ____ Guide to Percutaneous Lead Care and Equipment Maintenance ____ HM II Alarms for  Patients and their Caregivers ____ HM II Patient Handbook ____ HM II Patient Education Program DVD ____ HM II Information and Emergency Assistance Guide ____ HM II Power Module Alarms, Care, Maintenance ____ Daily diary sheets ____    [facility] VAD Patient Education Booklet ____    Care of the Percutaneous Lead ____    HM II LVAS Emergency Response Checklist   Discharge equipment includes:  ____ Two system controllers with preset: Fixed speed of 9400 RPM Low speed limit 8800 RPM ____ One power module (PM) with 20' patient cable ____ One universal Charity fundraiser (UBC) ____ Eight fully charged batteries ____ Four battery clips ____ One travel case ____ One holster vest  Following notification process completed with: _____   [facility] _____   [employer]    Discussed frequency and importance of INR checks; emphasized importance of maintaining INR goal to prevent clotting and  or bleeding issues with pump.  Patient will have INR managed by [facility] VAD team; current INR goal is 1.5 - 2.0.    Based on information received from [facility] II manufacturer ([employer]) about reported difficulties that have occurred with some patients and caregivers when changing from primary to back up pocket controller. Instructions were provided as follows:        1. Patient should have a caregiver present during system controller exchange or call     911 and VAD pager before attempting to change controller if alone. 2. As part of the weekly safety checklist, the patient and caregiver should review and      understand the steps and instructions involved with replacing the system controller.  3. As part of monthly safety checklist, the patient and caregiver should review and                 understand the system controller alarms and how to resolve them.  4. Updated copy of HM II Guide to Replacing the Running System Controller with the              Backup Controller for Patients and Their Caregiveriven reviewed and given to the              patient.  5. HM II Home Flowsheets given that include the weekly and monthly checks as      noted above.  The patient has completed a proficiency test for the HM II and all questions have been answered. The pt and wife have been instructed to call if any questions, problems, or concerns arise. Pt and caregiver successfully paged VAD coordinator using VAD pager emergency number and have been instructed to use this number only for emergencies. Patient and caregiver asked appropriate questions, had good interaction with VAD coordinator, and verbalized understanding of above instructions.                       Plan/Recommendations and Discharge Needs as discussed with VAD team:  1. Possible discharge Monday 10/25/14.

## 2014-10-21 NOTE — Progress Notes (Signed)
1500 PT walked pt this morning and OT has pt walking now. We will followup tomorrow. Graylon Good RN BSN 10/21/2014 3:09 PM

## 2014-10-21 NOTE — Progress Notes (Signed)
[facility]  Date of Admission:  09/10/2014  Antibiotics: cefepime  Subjective: No diarrhea, no sob, no complaints  Objective: Temp:  [97.3 F (36.3 C)-97.9 F (36.6 C)] 97.3 F (36.3 C) (04/20 2003) Pulse Rate:  [100-103] 102 (04/20 2003) SpO2:  [97 %] 97 % (04/20 2105)  General: awake, alert, nad Skin: no rashes Lungs: CTA B Cor: LVAD Abdomen: soft, nt, nd Ext: no edema Skin - chest incision without erythema, no crepitus  Lab Results Lab Results  Component Value Date   WBC 18.1* 10/21/2014   HGB 8.8* 10/21/2014   HCT 28.1* 10/21/2014   MCV 97.6 10/21/2014   PLT 363 10/21/2014    Lab Results  Component Value Date   CREATININE 0.77 10/21/2014   BUN 12 10/21/2014   NA 143 10/21/2014   K 3.3* 10/21/2014   CL 104 10/21/2014   CO2 31 10/21/2014    Lab Results  Component Value Date   ALT 30 10/19/2014   AST 38* 10/19/2014   ALKPHOS 277* 10/19/2014   BILITOT 0.7 10/19/2014      Microbiology: Recent Results (from the past 240 hour(s))  MRSA PCR Screening     Status: None   Collection Time: 10/11/14  1:00 PM  Result Value Ref Range Status   MRSA by PCR NEGATIVE NEGATIVE Final    Comment:        The GeneXpert MRSA Assay (FDA approved for NASAL specimens only), is one component of a comprehensive MRSA colonization surveillance program. It is not intended to diagnose MRSA infection nor to guide or monitor treatment for MRSA infections.   Clostridium Difficile by PCR     Status: None   Collection Time: 10/11/14  1:37 PM  Result Value Ref Range Status   C difficile by pcr NEGATIVE NEGATIVE Final  Culture, blood (routine x 2)     Status: None (Preliminary result)   Collection Time: 10/17/14 12:19 PM  Result Value Ref Range Status   Specimen Description BLOOD LEFT HAND  Final   Special Requests BOTTLES DRAWN AEROBIC ONLY 2.5CC  Final   Culture   Final           BLOOD CULTURE RECEIVED NO GROWTH TO DATE CULTURE WILL BE HELD  FOR 5 DAYS BEFORE ISSUING A FINAL NEGATIVE REPORT Performed at [facility]    Report Status PENDING  Incomplete  Culture, blood (routine x 2)     Status: None (Preliminary result)   Collection Time: 10/17/14 12:30 PM  Result Value Ref Range Status   Specimen Description BLOOD LEFT ANTECUBITAL  Final   Special Requests BOTTLES DRAWN AEROBIC ONLY 1CC  Final   Culture   Final           BLOOD CULTURE RECEIVED NO GROWTH TO DATE CULTURE WILL BE HELD FOR 5 DAYS BEFORE ISSUING A FINAL NEGATIVE REPORT Performed at [facility]    Report Status PENDING  Incomplete    Studies/Results: Dg Chest 2 View  10/19/2014   CLINICAL DATA:  Shortness of breath.  EXAM: CHEST  2 VIEW  COMPARISON:  October 17, 2014.  FINDINGS: Stable cardiomegaly. Sternotomy wires are noted. Left ventricular assistance device is again noted and unchanged in position. No pneumothorax is noted. Minimal bilateral pleural effusions are noted. Stable right lower lobe opacity is noted consistent with atelectasis or possibly pneumonia. Right-sided PICC line is unchanged in position with distal tip at the expected position of the cavoatrial junction. Stable mild left  basilar opacity is noted as well.  IMPRESSION: Stable bibasilar opacities are noted concerning for pneumonia or atelectasis. Stable minimal bilateral pleural effusions are noted.   Electronically Signed   By: Marijo Conception, M.D.   On: 10/19/2014 11:12    Assessment/Plan:  1) leukocytosis - improved to 18 today.  Antibiotics narrowed to cefepime.  No signs of infection.  ESR unremarkable.   -continue with cefepime for now, follow WBC  Ulyess Muto, Herbie [place], [place] [facility] [place] [email or web address] [phone] pager   [phone] cell 10/21/2014, 8:29 AM

## 2014-10-21 NOTE — Progress Notes (Signed)
CSW met with wife in hospital room. Patient was at [facility]. Wife reports better day today and that patient was able to sleep last night for almost 6 hours. Wife in good spirits noted she will meet with VAD Coordinator to review teaching and she is feeling confident about care needs. Wife appears to be coping well and managing care needs of patient. CSW will continue to follow for support throughout recovery. Raquel Sarna, [place]

## 2014-10-22 DIAGNOSIS — I509 Heart failure, unspecified: Secondary | ICD-10-CM

## 2014-10-22 LAB — BASIC METABOLIC PANEL
Anion gap: 8 (ref 5–15)
BUN: 13 mg/dL (ref 6–23)
CO2: 29 mmol/L (ref 19–32)
Calcium: 8.3 mg/dL — ABNORMAL LOW (ref 8.4–10.5)
Chloride: 105 mmol/L (ref 96–112)
Creatinine, Ser: 0.84 mg/dL (ref 0.50–1.35)
GFR calc Af Amer: >90 mL/min (ref >90)
GFR calc non Af Amer: 89 mL/min — ABNORMAL LOW (ref >90)
Glucose, Bld: 159 mg/dL — ABNORMAL HIGH (ref 70–99)
Potassium: 3.6 mmol/L (ref 3.5–5.1)
Sodium: 142 mmol/L (ref 135–145)

## 2014-10-22 LAB — GLUCOSE, CAPILLARY
GLUCOSE-CAPILLARY: 107 mg/dL — AB (ref 70–99)
Glucose-Capillary: 116 mg/dL — ABNORMAL HIGH (ref 70–99)
Glucose-Capillary: 95 mg/dL (ref 70–99)
Glucose-Capillary: 130 mg/dL — ABNORMAL HIGH (ref 70–99)
Glucose-Capillary: 127 mg/dL — ABNORMAL HIGH (ref 70–99)

## 2014-10-22 LAB — PROTIME-INR
INR: 1.65 — ABNORMAL HIGH (ref 0.00–1.49)
PROTHROMBIN TIME: 19.7 s — AB (ref 11.6–15.2)

## 2014-10-22 LAB — CBC
HCT: 28.6 % — ABNORMAL LOW (ref 39.0–52.0)
Hemoglobin: 8.7 g/dL — ABNORMAL LOW (ref 13.0–17.0)
MCH: 30.1 pg (ref 26.0–34.0)
MCHC: 30.4 g/dL (ref 30.0–36.0)
MCV: 99 fL (ref 78.0–100.0)
Platelets: 328 10*3/uL (ref 150–400)
RBC: 2.89 MIL/uL — ABNORMAL LOW (ref 4.22–5.81)
RDW: 19.2 % — ABNORMAL HIGH (ref 11.5–15.5)
WBC: 17.5 10*3/uL — ABNORMAL HIGH (ref 4.0–10.5)

## 2014-10-22 LAB — LACTATE DEHYDROGENASE: LDH: 408 U/L — AB (ref 94–250)

## 2014-10-22 LAB — MAGNESIUM: Magnesium: 1.9 mg/dL (ref 1.5–2.5)

## 2014-10-22 MED ORDER — POTASSIUM CHLORIDE CRYS ER 20 MEQ PO TBCR
40.0000 meq | EXTENDED_RELEASE_TABLET | Freq: Once | ORAL | Status: AC
  Administered 2014-10-22: 40 meq via ORAL
  Filled 2014-10-22: qty 2

## 2014-10-22 MED ORDER — WARFARIN SODIUM 2.5 MG PO TABS
2.5000 mg | ORAL_TABLET | Freq: Once | ORAL | Status: AC
  Administered 2014-10-22: 2.5 mg via ORAL
  Filled 2014-10-22: qty 1

## 2014-10-22 NOTE — Progress Notes (Signed)
ANTIBIOTIC CONSULT NOTE   Pharmacy Consult for cefepime Indication: rule out pneumonia  No Known Allergies  Patient Measurements: Height: 5\' 8"  (172.7 cm) Weight: 172 lb 13.5 oz (78.4 kg) IBW/kg (Calculated) : 68.4   Vital Signs: Temp: 97.5 F (36.4 C) (04/22 0447) Temp Source: Axillary (04/22 0447) Pulse Rate: 96 (04/22 0447) Intake/Output from previous day: 04/21 0701 - 04/22 0700 In: 200 [P.O.:200] Out: 1650 [Urine:1650] Intake/Output from this shift: Total I/O In: -  Out: 150 [Urine:150]  Labs:  Recent Labs  10/20/14 0512 10/21/14 0450 10/22/14 0505  WBC 21.6* 18.1* 17.5*  HGB 8.9* 8.8* 8.7*  PLT 402* 363 328  CREATININE  --  0.77 0.84   Estimated Creatinine Clearance: 83.7 mL/min (by C-G formula based on Cr of 0.84). No results for input(s): VANCOTROUGH, VANCOPEAK, VANCORANDOM, GENTTROUGH, GENTPEAK, GENTRANDOM, TOBRATROUGH, TOBRAPEAK, TOBRARND, AMIKACINPEAK, AMIKACINTROU, AMIKACIN in the last 72 hours.   Microbiology: Recent Results (from the past 720 hour(s))  Urine culture     Status: None   Collection Time: 09/25/14  8:14 PM  Result Value Ref Range Status   Specimen Description URINE, RANDOM  Final   Special Requests NONE  Final   Colony Count NO GROWTH Performed at [facility]   Final   Culture NO GROWTH Performed at [facility]   Final   Report Status 09/27/2014 FINAL  Final  Culture, Urine     Status: None   Collection Time: 09/27/14 10:10 AM  Result Value Ref Range Status   Specimen Description URINE, CATHETERIZED  Final   Special Requests NONE  Final   Colony Count NO GROWTH Performed at [facility]   Final   Culture NO GROWTH Performed at [facility]   Final   Report Status 09/28/2014 FINAL  Final  Culture, blood (routine x 2)     Status: None   Collection Time: 10/05/14  7:46 PM  Result Value Ref Range Status   Specimen Description BLOOD LEFT ARM  Final   Special Requests BOTTLES DRAWN AEROBIC  AND ANAEROBIC 10CC EACH  Final   Culture   Final    NO GROWTH 5 DAYS Performed at [facility]    Report Status 10/12/2014 FINAL  Final  Culture, blood (routine x 2)     Status: None   Collection Time: 10/05/14  7:50 PM  Result Value Ref Range Status   Specimen Description BLOOD LEFT ARM  Final   Special Requests BOTTLES DRAWN AEROBIC AND ANAEROBIC 10CCS EACH  Final   Culture   Final    NO GROWTH 5 DAYS Performed at [facility]    Report Status 10/12/2014 FINAL  Final  Culture, respiratory (NON-Expectorated)     Status: None   Collection Time: 10/05/14  8:12 PM  Result Value Ref Range Status   Specimen Description TRACHEAL ASPIRATE  Final   Special Requests Normal  Final   Gram Stain   Final    NO WBC SEEN RARE SQUAMOUS EPITHELIAL CELLS PRESENT RARE GRAM POSITIVE COCCI IN PAIRS Performed at [facility]    Culture   Final    Non-Pathogenic Oropharyngeal-type Flora Isolated. Performed at [facility]    Report Status 10/08/2014 FINAL  Final  MRSA PCR Screening     Status: None   Collection Time: 10/11/14  1:00 PM  Result Value Ref Range Status   MRSA by PCR NEGATIVE NEGATIVE Final    Comment:        The  GeneXpert MRSA Assay (FDA approved for NASAL specimens only), is one component of a comprehensive MRSA colonization surveillance program. It is not intended to diagnose MRSA infection nor to guide or monitor treatment for MRSA infections.   Clostridium Difficile by PCR     Status: None   Collection Time: 10/11/14  1:37 PM  Result Value Ref Range Status   C difficile by pcr NEGATIVE NEGATIVE Final  Culture, blood (routine x 2)     Status: None (Preliminary result)   Collection Time: 10/17/14 12:19 PM  Result Value Ref Range Status   Specimen Description BLOOD LEFT HAND  Final   Special Requests BOTTLES DRAWN AEROBIC ONLY 2.5CC  Final   Culture   Final           BLOOD CULTURE RECEIVED NO GROWTH TO DATE CULTURE WILL BE HELD FOR 5  DAYS BEFORE ISSUING A FINAL NEGATIVE REPORT Performed at [facility]    Report Status PENDING  Incomplete  Culture, blood (routine x 2)     Status: None (Preliminary result)   Collection Time: 10/17/14 12:30 PM  Result Value Ref Range Status   Specimen Description BLOOD LEFT ANTECUBITAL  Final   Special Requests BOTTLES DRAWN AEROBIC ONLY 1CC  Final   Culture   Final           BLOOD CULTURE RECEIVED NO GROWTH TO DATE CULTURE WILL BE HELD FOR 5 DAYS BEFORE ISSUING A FINAL NEGATIVE REPORT Performed at [facility]    Report Status PENDING  Incomplete   Assessment: 66 YOM s/p LVAD and s/p PNA who has completed several courses of antibiotics (see details below), who had rising WBC. Today, WBC down 17.5, no other source of leukocytosis noted (ie no steroids), afebrile.   vanc 3/17 >>3/21, resume 4/5>>4/14 resume 4/18>>4/20 3/26 VT 14.7 4/8 VT 16 on 1g q12 ceftaz 3/17>> 3/22; 3/24>> 3/28 Zinacef 3/22>>3/24 [x 48 hrs] rifampin 3/22 x2 complete augmentin 3/28>>3/29 zosyn 3/29 >> 4/5, resume 4/6>>4/14 Merrem 4/17>> 4/20 4/17 Fluconazole > (x 3 doses) Cefepime 4/20>>  Per ID recommendations,have narrowed to cefepime. SCr 0.8, est CrCl ~56mL/min.  Also on chronic Coumadin for LVAD.  INR goal 1.5-2 given recent bleeding. Patient noted to have significant nose bleed overnight (~8h) looks like only packing was used. Appears to be resolved this am. INR up to 1.6 this am. Will follow dosing closely. CVTS dosing warfarin.  Goal of Therapy:  Proper dosing of antibiotics based on hepatic and renal function INR goal 1.5-2 per MD  Plan:  -cefepime 1g IV q8h -follow clinical progression, further ID recommendations regarding LOT -follow renal function -Warfarin 2mg  per MD tonight  Erin Hearing PharmD., BCPS Clinical Pharmacist Pager [phone] 10/22/2014 8:11 AM

## 2014-10-22 NOTE — Progress Notes (Signed)
  Echocardiogram 2D Echocardiogram has been performed.  Donata Clay 10/22/2014, 10:12 AM

## 2014-10-22 NOTE — Progress Notes (Signed)
[place] [facility] for Infectious Disease  Date of Admission:  09/10/2014  Antibiotics: cefepime  Subjective: No diarrhea, no sob, no complaints  Objective: Temp:  [97.5 F (36.4 C)-98 F (36.7 C)] 97.5 F (36.4 C) (04/22 0447) Pulse Rate:  [94-96] 96 (04/22 0447) Resp:  [16-17] 17 (04/22 0447) SpO2:  [98 %-100 %] 98 % (04/22 0447) Weight:  [172 lb 13.5 oz (78.4 kg)] 172 lb 13.5 oz (78.4 kg) (04/22 0447)  General: awake, alert, nad Skin: no rashes Lungs: CTA B Cor: LVAD Abdomen: soft, nt, nd Ext: no edema Skin - chest incision without erythema, no crepitus  Lab Results Lab Results  Component Value Date   WBC 17.5* 10/22/2014   HGB 8.7* 10/22/2014   HCT 28.6* 10/22/2014   MCV 99.0 10/22/2014   PLT 328 10/22/2014    Lab Results  Component Value Date   CREATININE 0.84 10/22/2014   BUN 13 10/22/2014   NA 142 10/22/2014   K 3.6 10/22/2014   CL 105 10/22/2014   CO2 29 10/22/2014    Lab Results  Component Value Date   ALT 30 10/19/2014   AST 38* 10/19/2014   ALKPHOS 277* 10/19/2014   BILITOT 0.7 10/19/2014      Microbiology: Recent Results (from the past 240 hour(s))  Culture, blood (routine x 2)     Status: None (Preliminary result)   Collection Time: 10/17/14 12:19 PM  Result Value Ref Range Status   Specimen Description BLOOD LEFT HAND  Final   Special Requests BOTTLES DRAWN AEROBIC ONLY 2.5CC  Final   Culture   Final           BLOOD CULTURE RECEIVED NO GROWTH TO DATE CULTURE WILL BE HELD FOR 5 DAYS BEFORE ISSUING A FINAL NEGATIVE REPORT Performed at [facility]    Report Status PENDING  Incomplete  Culture, blood (routine x 2)     Status: None (Preliminary result)   Collection Time: 10/17/14 12:30 PM  Result Value Ref Range Status   Specimen Description BLOOD LEFT ANTECUBITAL  Final   Special Requests BOTTLES DRAWN AEROBIC ONLY 1CC  Final   Culture   Final           BLOOD CULTURE RECEIVED NO GROWTH TO DATE CULTURE WILL BE HELD FOR 5 DAYS  BEFORE ISSUING A FINAL NEGATIVE REPORT Performed at [facility]    Report Status PENDING  Incomplete    Studies/Results: Ct Abdomen Wo Contrast  10/21/2014   CLINICAL DATA:  69 year old male status post LVAD placement. Elevated white blood cell count. LVAD placement on 09/22/2014. Most recent surgery performed on 10/07/2014 (re-exploration and evacuation of hematoma). Surgical drain the removed yesterday.  EXAM: CT CHEST AND ABDOMEN WITHOUT CONTRAST  TECHNIQUE: Multidetector CT imaging of the chest and abdomen was performed following the standard protocol without intravenous contrast.  COMPARISON:  Chest CT 09/19/2014.  FINDINGS: CT CHEST FINDINGS  Mediastinum/Lymph Nodes: Left ventricular assist device now noted, with the inflow cannula in the left ventricular apex directed toward the interventricular septum, and the outflow cannula anastomosing to the distal ascending thoracic aorta. A large amount of fluid is noted in the anterior mediastinum adjacent to the outflow cannula, and several small locules of gas are noted in this region, greater than expected for patient nearly 1 month following LVAD placement (and 2 weeks out from the follow-up left anterolateral thoracotomy and hematoma evacuation). This fluid collection in the anterior mediastinum is predominantly low to intermediate attenuation, suggesting some residual  proteinaceous or hemorrhagic contents, however, this is not frankly hemorrhagic. Although the heart remains mildly enlarged, overall cardiac size (both left ventricle and right ventricle) appears significantly improved compared to the prior study from 08/22/2014. There is atherosclerosis of the thoracic aorta, the great vessels of the mediastinum and the coronary arteries, including calcified atherosclerotic plaque in the left main, left anterior descending, left circumflex and right coronary arteries. Coronary artery stents are noted in the left anterior descending and left  circumflex coronary arteries. Numerous borderline enlarged mediastinal and hilar lymph nodes are noted, and are nonspecific, but similar to the prior examination. Esophagus is unremarkable in appearance. No axillary lymphadenopathy. Right upper extremity PICC with tip terminating in the right atrium.  Lungs/Pleura: The complex multilocular right-sided pleural effusion is persistent but has significantly decreased in size,and continues to have some high attenuation material within some of the loculated spaces, as well as several loculations of gas. There is extensive varicose and cystic bronchiectasis in the right lower lobe adjacent to this multilocular pleural effusion, such that a bronchopleural fistula is not excluded. In addition, several of the areas of airspace consolidation noted on the prior study in the right middle lobe have largely improved, however, there is some cavitation in the remaining consolidated lung parenchyma, best appreciated on images 34 and 48 of series 3, which is new compared to the prior study. Small left-sided pleural effusion layering dependently. Small locule of gas in the inferior left hemithorax, may be pleural or may be within the inferior aspect of the left lower lobe (i.e., an additional area of cavitation). Small amount of loculated pleural fluid in the medial aspect of the upper hemithorax anteriorly. Diffuse lower lung predominant bronchial wall thickening and patchy areas of peribronchovascular ground-glass attenuation and micronodularity, presumably infectious.  Musculoskeletal/Soft Tissues: Median sternotomy wires. Midline skin staples anterior to the sternum. Ankylosis throughout the thoracic spine. There are no aggressive appearing lytic or blastic lesions noted in the visualized portions of the skeleton.  CT ABDOMEN FINDINGS  LVAD-Specific Findings: The left ventricular assist device is in a pocket in the anterior aspect of the peritoneal cavity in the left upper  quadrant. The device is surrounded by a moderate to large volume of fluid with multiple locules of gas, best demonstrated by an air-fluid level on image 84 of series 2. As discussed above, the volume of fluid and gas is greater than expected for someone most recently 2 weeks postoperative. There does not appear to be fluid adjacent to the drive line once the drive line exits the anterior abdominal wall within the subcutaneous fat of the right side of the upper abdomen. The more proximal aspects of the drive line are surrounded by fluid in the upper peritoneal cavity.  Hepatobiliary: 6 mm calcified gallstone lying dependently in the gallbladder. No current findings to suggest an acute cholecystitis at this time. The unenhanced appearance of the liver is unremarkable.  Pancreas: Unremarkable.  Spleen: Nodular contour of the spleen.  Otherwise, unremarkable.  Adrenals/Urinary Tract: Bilateral adrenal glands are normal in appearance. Numerous nonobstructive calculi are present within the collecting systems of the kidneys bilaterally, largest of which measure up to 6 mm in the lower pole collecting system of the right kidney. Within the visualized portions of the abdomen there are no ureteral stones, and there is no hydroureteronephrosis to indicate urinary tract obstruction at this time.  Stomach/Bowel: The unenhanced appearance of the visualized stomach, small bowel and colon is unremarkable.  Vascular/Lymphatic: IVC filter in position with  tip terminating below the level of the renal veins. No significant atherosclerotic disease in the abdominal vasculature. No lymphadenopathy noted in the abdomen.  Other: LVAD specific findings, as above. No significant volume of ascites. Other than the gas within the pocket around the LVAD, there is no additional pneumoperitoneum.  Musculoskeletal: There are no aggressive appearing lytic or blastic lesions noted in the visualized portions of the skeleton.  IMPRESSION: 1. LVAD in  position, surrounded by a large amount of gas and fluid. These findings are typically associated with infection of the surrounding pocket, however, given the recent surgical reexploration and yesterday's removal of a surgical drain, there are other potential explanations for these findings. Clinical correlation is recommended. 2. Extensive varicose and cystic bronchiectasis in the posterior aspect of the right lower lobe, adjacent to a complex multilocular pleural fluid collection with some residual pleural gas. These findings remain concerning for potential bronchopleural fistula, with adjacent empyema, however, this size of this collection has significantly decreased compared to the prior examination. 3. Areas of airspace consolidation noted on the prior study in the right middle lobe have improved, however, there is now internal cavitation within these regions in the right middle lobe, which suggests a resolving necrotizing pneumonia 4. In addition, there is a single locule of gas in the base of the left hemithorax. It is uncertain whether or not this is within the pleural space in the midst of a small left pleural effusion, or is within the inferior aspect of the wall left lower lobe lung parenchyma, in which case is could be an additional area of cavitation. 5. Bilateral nonobstructive nephrolithiasis. 6. Additional incidental findings, as detailed above.  These results were called by telephone at the time of interpretation on 10/21/2014 at 1:16 pm to Dr. Ivin Poot , who verbally acknowledged these results.   Electronically Signed   By: Vinnie Langton M.D.   On: 10/21/2014 13:27   Ct Chest Wo Contrast  10/21/2014   CLINICAL DATA:  69 year old male status post LVAD placement. Elevated white blood cell count. LVAD placement on 09/22/2014. Most recent surgery performed on 10/07/2014 (re-exploration and evacuation of hematoma). Surgical drain the removed yesterday.  EXAM: CT CHEST AND ABDOMEN WITHOUT  CONTRAST  TECHNIQUE: Multidetector CT imaging of the chest and abdomen was performed following the standard protocol without intravenous contrast.  COMPARISON:  Chest CT 09/19/2014.  FINDINGS: CT CHEST FINDINGS  Mediastinum/Lymph Nodes: Left ventricular assist device now noted, with the inflow cannula in the left ventricular apex directed toward the interventricular septum, and the outflow cannula anastomosing to the distal ascending thoracic aorta. A large amount of fluid is noted in the anterior mediastinum adjacent to the outflow cannula, and several small locules of gas are noted in this region, greater than expected for patient nearly 1 month following LVAD placement (and 2 weeks out from the follow-up left anterolateral thoracotomy and hematoma evacuation). This fluid collection in the anterior mediastinum is predominantly low to intermediate attenuation, suggesting some residual proteinaceous or hemorrhagic contents, however, this is not frankly hemorrhagic. Although the heart remains mildly enlarged, overall cardiac size (both left ventricle and right ventricle) appears significantly improved compared to the prior study from 08/22/2014. There is atherosclerosis of the thoracic aorta, the great vessels of the mediastinum and the coronary arteries, including calcified atherosclerotic plaque in the left main, left anterior descending, left circumflex and right coronary arteries. Coronary artery stents are noted in the left anterior descending and left circumflex coronary arteries. Numerous borderline enlarged  mediastinal and hilar lymph nodes are noted, and are nonspecific, but similar to the prior examination. Esophagus is unremarkable in appearance. No axillary lymphadenopathy. Right upper extremity PICC with tip terminating in the right atrium.  Lungs/Pleura: The complex multilocular right-sided pleural effusion is persistent but has significantly decreased in size,and continues to have some high attenuation  material within some of the loculated spaces, as well as several loculations of gas. There is extensive varicose and cystic bronchiectasis in the right lower lobe adjacent to this multilocular pleural effusion, such that a bronchopleural fistula is not excluded. In addition, several of the areas of airspace consolidation noted on the prior study in the right middle lobe have largely improved, however, there is some cavitation in the remaining consolidated lung parenchyma, best appreciated on images 34 and 48 of series 3, which is new compared to the prior study. Small left-sided pleural effusion layering dependently. Small locule of gas in the inferior left hemithorax, may be pleural or may be within the inferior aspect of the left lower lobe (i.e., an additional area of cavitation). Small amount of loculated pleural fluid in the medial aspect of the upper hemithorax anteriorly. Diffuse lower lung predominant bronchial wall thickening and patchy areas of peribronchovascular ground-glass attenuation and micronodularity, presumably infectious.  Musculoskeletal/Soft Tissues: Median sternotomy wires. Midline skin staples anterior to the sternum. Ankylosis throughout the thoracic spine. There are no aggressive appearing lytic or blastic lesions noted in the visualized portions of the skeleton.  CT ABDOMEN FINDINGS  LVAD-Specific Findings: The left ventricular assist device is in a pocket in the anterior aspect of the peritoneal cavity in the left upper quadrant. The device is surrounded by a moderate to large volume of fluid with multiple locules of gas, best demonstrated by an air-fluid level on image 84 of series 2. As discussed above, the volume of fluid and gas is greater than expected for someone most recently 2 weeks postoperative. There does not appear to be fluid adjacent to the drive line once the drive line exits the anterior abdominal wall within the subcutaneous fat of the right side of the upper abdomen. The  more proximal aspects of the drive line are surrounded by fluid in the upper peritoneal cavity.  Hepatobiliary: 6 mm calcified gallstone lying dependently in the gallbladder. No current findings to suggest an acute cholecystitis at this time. The unenhanced appearance of the liver is unremarkable.  Pancreas: Unremarkable.  Spleen: Nodular contour of the spleen.  Otherwise, unremarkable.  Adrenals/Urinary Tract: Bilateral adrenal glands are normal in appearance. Numerous nonobstructive calculi are present within the collecting systems of the kidneys bilaterally, largest of which measure up to 6 mm in the lower pole collecting system of the right kidney. Within the visualized portions of the abdomen there are no ureteral stones, and there is no hydroureteronephrosis to indicate urinary tract obstruction at this time.  Stomach/Bowel: The unenhanced appearance of the visualized stomach, small bowel and colon is unremarkable.  Vascular/Lymphatic: IVC filter in position with tip terminating below the level of the renal veins. No significant atherosclerotic disease in the abdominal vasculature. No lymphadenopathy noted in the abdomen.  Other: LVAD specific findings, as above. No significant volume of ascites. Other than the gas within the pocket around the LVAD, there is no additional pneumoperitoneum.  Musculoskeletal: There are no aggressive appearing lytic or blastic lesions noted in the visualized portions of the skeleton.  IMPRESSION: 1. LVAD in position, surrounded by a large amount of gas and fluid. These findings are  typically associated with infection of the surrounding pocket, however, given the recent surgical reexploration and yesterday's removal of a surgical drain, there are other potential explanations for these findings. Clinical correlation is recommended. 2. Extensive varicose and cystic bronchiectasis in the posterior aspect of the right lower lobe, adjacent to a complex multilocular pleural fluid  collection with some residual pleural gas. These findings remain concerning for potential bronchopleural fistula, with adjacent empyema, however, this size of this collection has significantly decreased compared to the prior examination. 3. Areas of airspace consolidation noted on the prior study in the right middle lobe have improved, however, there is now internal cavitation within these regions in the right middle lobe, which suggests a resolving necrotizing pneumonia 4. In addition, there is a single locule of gas in the base of the left hemithorax. It is uncertain whether or not this is within the pleural space in the midst of a small left pleural effusion, or is within the inferior aspect of the wall left lower lobe lung parenchyma, in which case is could be an additional area of cavitation. 5. Bilateral nonobstructive nephrolithiasis. 6. Additional incidental findings, as detailed above.  These results were called by telephone at the time of interpretation on 10/21/2014 at 1:16 pm to Dr. Ivin Poot , who verbally acknowledged these results.   Electronically Signed   By: Vinnie Langton M.D.   On: 10/21/2014 13:27    Assessment/Plan:  1) leukocytosis - improved to 17 today.    No signs of infection.  ESR unremarkable.   -will stop cefepime and observe off of antibiotics  Scharlene Gloss, [place] [facility] [place] [email or web address] [phone] pager   [phone] cell 10/22/2014, 10:08 AM

## 2014-10-22 NOTE — Progress Notes (Signed)
CARDIAC REHAB PHASE I   PRE:  Rate/Rhythm: 98 SR  BP:  Sitting: 82 MAP        SaO2: 96 RA  MODE:  Ambulation: 430 ft   POST:  Rate/Rhythm: 112 ST  BP:  Sitting: 76 MAP         SaO2: 96 RA  Pt ambulated 430 ft on RA, assist x1, steady gait, rollator, IV, tolerated well. Standing rest x1, sitting rest x1. Pt with mild DOE, much improved. Pt states he "feels much stronger than two days ago." No other complaints. Pt returned to chair with call bell within reach.  Pt wife at bedside. Pt remains on battery pack at this time per OT request (at bedside).   [id number]    Lenna Sciara, RN, BSN 10/22/2014 1:48 PM

## 2014-10-22 NOTE — Progress Notes (Signed)
Occupational Therapy Treatment Patient Details Name: Scott [name] MRN: [id number] DOB: Mar 09, 1946 Today's Date: 10/22/2014    History of present illness 69 y.o. M with extensive cardiac hx as well as recent PE / DVT, admitted for SOB due to heart failure, cardiogenic, shock, PE. Evaluated by VAD team. Found to have mod - large R pleural effusion. LVAD implantation 09/22/14. Progressed well until developed hemothorax and required return to OR 4/5 and 4/6 for Redo sternotomy, reexploration of chest and removal of hematoma.   OT comments  This 69 yo male admitted and underwent above presents to acute OT with all education completed with pt and wife on BADLs with a very supportive wife, acute OT will sign off.  Follow Up Recommendations  Home health OT    Equipment Recommendations  3 in 1 bedside comode (rollator)       Precautions / Restrictions Precautions Precautions: Sternal;Fall Precaution Comments: LVAD Restrictions Weight Bearing Restrictions: Yes Other Position/Activity Restrictions: sternal--pt needs VCs       Mobility Bed Mobility Overal bed mobility: Independent Bed Mobility: Sit to Supine         Sit to sidelying: Independent    Transfers Overall transfer level: Needs assistance   Transfers: Sit to/from Stand Sit to Stand: Supervision (reminder to not push up from handles of chair due to spinal precautions)                  ADL                                         General ADL Comments: I left energy conservation handout in room earlier when pt was getting ready to have an echocardiogram completed. His wife reports that she read it and that only about 1/3 of it would really apply to her husband. I explained to pt that one of the big things is that it takes less energy to do something sitting than it does in standing and that if he was completing a task that was going to take a while then he needed to try and sit to do as much of  it as he can. Pt was able to change from battery pack to main power source with only his wife assisting him with handing him the main power source cords. He started to do the black cord first, but then self corrected and said he needed to do the white cord first. Wife felt like it would be good for pt to have a 3n1 espcially at night so he wouldn't be trying to get up in the middle of the night going to the bathroom and getting tangled up in his LVAD cords and she can use it as a shower seat in their walk in shower.                                          Frequency Min 2X/week     Progress Toward Goals  OT Goals(current goals can now be found in the care plan section)  Progress towards OT goals:  (all education completed, and pt/wife without any further questions about BADLs)     Plan Discharge plan remains appropriate       End of Session     Activity Tolerance Patient tolerated treatment well  Patient Left in bed           Time: 0768-0881 OT Time Calculation (min): 21 min  Charges: OT General Charges $OT Visit: 1 Procedure OT Treatments $Self Care/Home Management : 8-22 mins  Almon Register [phone] 10/22/2014, 3:54 PM

## 2014-10-22 NOTE — Progress Notes (Signed)
Patient ID: Scott [name], male   DOB: Mar 25, 1946, 69 y.o.   MRN: [id number] HeartMate 2 Rounding Note  Subjective:    S/P HMII LVAD on 09/20/2013.   Moved to ICU  4/5 due to low MAP and low hemoglobin. Taken Back to the OR 4/5 and 4/6  for recurrent bleed and evacuation of clots from left chest.   Doing well. Ambulating.  Antibiotcis narrowed per ID to cefepime.  WBC down to 17.5.  CT chest reviewed. RML necrotic from previous pulmonary infarct. Residual fluid collection in LVAD pocket and L pleural space  INR 1.6 LDH 409>448 >408>408  Hgb 8.9>8.8 >8.7   LVAD INTERROGATION:  HeartMate II LVAD:  Flow 6.3  liters/min, speed 9400, power 6, PI 4  No PI events.     Objective:    Vital Signs:   Temp:  [97.5 F (36.4 C)-98 F (36.7 C)] 97.5 F (36.4 C) (04/22 0447) Pulse Rate:  [94-96] 96 (04/22 0447) Resp:  [16-17] 17 (04/22 0447) SpO2:  [98 %-100 %] 98 % (04/22 0447) Weight:  [172 lb 13.5 oz (78.4 kg)] 172 lb 13.5 oz (78.4 kg) (04/22 0447) Last BM Date: 10/21/14 Mean arterial Pressure  82 Intake/Output:   Intake/Output Summary (Last 24 hours) at 10/22/14 0803 Last data filed at 10/22/14 0734  Gross per 24 hour  Intake    200 ml  Output   1800 ml  Net  -1600 ml     Physical Exam:  General: In bed.   HEENT: normal. R nare packed with 2x2 Neck: supple. Carotids 2+ bilat; no bruits. No lymphadenopathy or thryomegaly appreciated.  Cor: Mechanical heart sounds with LVAD hum present. Lungs: clear On room air.   Abdomen: soft, nontender, nondistended. No hepatosplenomegaly. No bruits or masses. Good bowel sounds. Driveline: Dressing looks good.  Extremities: no cyanosis, clubbing, rash.  No edema RUE PICC Neuro: Awake alert   Telemetry: NSR 90s    Labs: Basic Metabolic Panel:  Recent Labs Lab 10/17/14 0400 10/18/14 0401 10/19/14 0450 10/21/14 0450 10/22/14 0505  NA 148* 149* 146* 143 142  K 3.7 3.6 3.4* 3.3* 3.6  CL 108 111 108 104 105  CO2 33* 30 30 31 29    GLUCOSE 114* 117* 127* 101* 159*  BUN 12 12 11 12 13   CREATININE 0.81 0.82 0.78 0.77 0.84  CALCIUM 8.7 8.7 8.4 8.3* 8.3*  MG  --   --   --   --  1.9    Liver Function Tests:  Recent Labs Lab 10/16/14 0330 10/17/14 0400 10/18/14 0401 10/19/14 0450  AST 35 36 32 38*  ALT 30 29 29 30   ALKPHOS 287* 291* 282* 277*  BILITOT 1.0 1.0 0.9 0.7  PROT 5.6* 5.8* 5.8* 5.4*  ALBUMIN 2.1* 2.1* 2.1* 2.1*   No results for input(s): LIPASE, AMYLASE in the last 168 hours. No results for input(s): AMMONIA in the last 168 hours.  CBC:  Recent Labs Lab 10/18/14 0401 10/19/14 0450 10/20/14 0512 10/21/14 0450 10/22/14 0505  WBC 22.9* 21.0* 21.6* 18.1* 17.5*  HGB 9.3* 9.0* 8.9* 8.8* 8.7*  HCT 30.8* 29.9* 29.5* 28.1* 28.6*  MCV 99.0 98.7 98.3 97.6 99.0  PLT 532* 441* 402* 363 328    INR:  Recent Labs Lab 10/18/14 0401 10/19/14 0450 10/20/14 0512 10/21/14 0450 10/22/14 0505  INR 1.72* 1.81* 1.87* 1.77* 1.65*    Other results:    Imaging: Ct Abdomen Wo Contrast  10/21/2014   CLINICAL DATA:  69 year old male status post  LVAD placement. Elevated white blood cell count. LVAD placement on 09/22/2014. Most recent surgery performed on 10/07/2014 (re-exploration and evacuation of hematoma). Surgical drain the removed yesterday.  EXAM: CT CHEST AND ABDOMEN WITHOUT CONTRAST  TECHNIQUE: Multidetector CT imaging of the chest and abdomen was performed following the standard protocol without intravenous contrast.  COMPARISON:  Chest CT 09/19/2014.  FINDINGS: CT CHEST FINDINGS  Mediastinum/Lymph Nodes: Left ventricular assist device now noted, with the inflow cannula in the left ventricular apex directed toward the interventricular septum, and the outflow cannula anastomosing to the distal ascending thoracic aorta. A large amount of fluid is noted in the anterior mediastinum adjacent to the outflow cannula, and several small locules of gas are noted in this region, greater than expected for patient  nearly 1 month following LVAD placement (and 2 weeks out from the follow-up left anterolateral thoracotomy and hematoma evacuation). This fluid collection in the anterior mediastinum is predominantly low to intermediate attenuation, suggesting some residual proteinaceous or hemorrhagic contents, however, this is not frankly hemorrhagic. Although the heart remains mildly enlarged, overall cardiac size (both left ventricle and right ventricle) appears significantly improved compared to the prior study from 08/22/2014. There is atherosclerosis of the thoracic aorta, the great vessels of the mediastinum and the coronary arteries, including calcified atherosclerotic plaque in the left main, left anterior descending, left circumflex and right coronary arteries. Coronary artery stents are noted in the left anterior descending and left circumflex coronary arteries. Numerous borderline enlarged mediastinal and hilar lymph nodes are noted, and are nonspecific, but similar to the prior examination. Esophagus is unremarkable in appearance. No axillary lymphadenopathy. Right upper extremity PICC with tip terminating in the right atrium.  Lungs/Pleura: The complex multilocular right-sided pleural effusion is persistent but has significantly decreased in size,and continues to have some high attenuation material within some of the loculated spaces, as well as several loculations of gas. There is extensive varicose and cystic bronchiectasis in the right lower lobe adjacent to this multilocular pleural effusion, such that a bronchopleural fistula is not excluded. In addition, several of the areas of airspace consolidation noted on the prior study in the right middle lobe have largely improved, however, there is some cavitation in the remaining consolidated lung parenchyma, best appreciated on images 34 and 48 of series 3, which is new compared to the prior study. Small left-sided pleural effusion layering dependently. Small locule of  gas in the inferior left hemithorax, may be pleural or may be within the inferior aspect of the left lower lobe (i.e., an additional area of cavitation). Small amount of loculated pleural fluid in the medial aspect of the upper hemithorax anteriorly. Diffuse lower lung predominant bronchial wall thickening and patchy areas of peribronchovascular ground-glass attenuation and micronodularity, presumably infectious.  Musculoskeletal/Soft Tissues: Median sternotomy wires. Midline skin staples anterior to the sternum. Ankylosis throughout the thoracic spine. There are no aggressive appearing lytic or blastic lesions noted in the visualized portions of the skeleton.  CT ABDOMEN FINDINGS  LVAD-Specific Findings: The left ventricular assist device is in a pocket in the anterior aspect of the peritoneal cavity in the left upper quadrant. The device is surrounded by a moderate to large volume of fluid with multiple locules of gas, best demonstrated by an air-fluid level on image 84 of series 2. As discussed above, the volume of fluid and gas is greater than expected for someone most recently 2 weeks postoperative. There does not appear to be fluid adjacent to the drive line once the drive  line exits the anterior abdominal wall within the subcutaneous fat of the right side of the upper abdomen. The more proximal aspects of the drive line are surrounded by fluid in the upper peritoneal cavity.  Hepatobiliary: 6 mm calcified gallstone lying dependently in the gallbladder. No current findings to suggest an acute cholecystitis at this time. The unenhanced appearance of the liver is unremarkable.  Pancreas: Unremarkable.  Spleen: Nodular contour of the spleen.  Otherwise, unremarkable.  Adrenals/Urinary Tract: Bilateral adrenal glands are normal in appearance. Numerous nonobstructive calculi are present within the collecting systems of the kidneys bilaterally, largest of which measure up to 6 mm in the lower pole collecting system  of the right kidney. Within the visualized portions of the abdomen there are no ureteral stones, and there is no hydroureteronephrosis to indicate urinary tract obstruction at this time.  Stomach/Bowel: The unenhanced appearance of the visualized stomach, small bowel and colon is unremarkable.  Vascular/Lymphatic: IVC filter in position with tip terminating below the level of the renal veins. No significant atherosclerotic disease in the abdominal vasculature. No lymphadenopathy noted in the abdomen.  Other: LVAD specific findings, as above. No significant volume of ascites. Other than the gas within the pocket around the LVAD, there is no additional pneumoperitoneum.  Musculoskeletal: There are no aggressive appearing lytic or blastic lesions noted in the visualized portions of the skeleton.  IMPRESSION: 1. LVAD in position, surrounded by a large amount of gas and fluid. These findings are typically associated with infection of the surrounding pocket, however, given the recent surgical reexploration and yesterday's removal of a surgical drain, there are other potential explanations for these findings. Clinical correlation is recommended. 2. Extensive varicose and cystic bronchiectasis in the posterior aspect of the right lower lobe, adjacent to a complex multilocular pleural fluid collection with some residual pleural gas. These findings remain concerning for potential bronchopleural fistula, with adjacent empyema, however, this size of this collection has significantly decreased compared to the prior examination. 3. Areas of airspace consolidation noted on the prior study in the right middle lobe have improved, however, there is now internal cavitation within these regions in the right middle lobe, which suggests a resolving necrotizing pneumonia 4. In addition, there is a single locule of gas in the base of the left hemithorax. It is uncertain whether or not this is within the pleural space in the midst of a  small left pleural effusion, or is within the inferior aspect of the wall left lower lobe lung parenchyma, in which case is could be an additional area of cavitation. 5. Bilateral nonobstructive nephrolithiasis. 6. Additional incidental findings, as detailed above.  These results were called by telephone at the time of interpretation on 10/21/2014 at 1:16 pm to Dr. Ivin Poot , who verbally acknowledged these results.   Electronically Signed   By: Vinnie Langton M.D.   On: 10/21/2014 13:27   Ct Chest Wo Contrast  10/21/2014   CLINICAL DATA:  69 year old male status post LVAD placement. Elevated white blood cell count. LVAD placement on 09/22/2014. Most recent surgery performed on 10/07/2014 (re-exploration and evacuation of hematoma). Surgical drain the removed yesterday.  EXAM: CT CHEST AND ABDOMEN WITHOUT CONTRAST  TECHNIQUE: Multidetector CT imaging of the chest and abdomen was performed following the standard protocol without intravenous contrast.  COMPARISON:  Chest CT 09/19/2014.  FINDINGS: CT CHEST FINDINGS  Mediastinum/Lymph Nodes: Left ventricular assist device now noted, with the inflow cannula in the left ventricular apex directed toward the interventricular  septum, and the outflow cannula anastomosing to the distal ascending thoracic aorta. A large amount of fluid is noted in the anterior mediastinum adjacent to the outflow cannula, and several small locules of gas are noted in this region, greater than expected for patient nearly 1 month following LVAD placement (and 2 weeks out from the follow-up left anterolateral thoracotomy and hematoma evacuation). This fluid collection in the anterior mediastinum is predominantly low to intermediate attenuation, suggesting some residual proteinaceous or hemorrhagic contents, however, this is not frankly hemorrhagic. Although the heart remains mildly enlarged, overall cardiac size (both left ventricle and right ventricle) appears significantly improved  compared to the prior study from 08/22/2014. There is atherosclerosis of the thoracic aorta, the great vessels of the mediastinum and the coronary arteries, including calcified atherosclerotic plaque in the left main, left anterior descending, left circumflex and right coronary arteries. Coronary artery stents are noted in the left anterior descending and left circumflex coronary arteries. Numerous borderline enlarged mediastinal and hilar lymph nodes are noted, and are nonspecific, but similar to the prior examination. Esophagus is unremarkable in appearance. No axillary lymphadenopathy. Right upper extremity PICC with tip terminating in the right atrium.  Lungs/Pleura: The complex multilocular right-sided pleural effusion is persistent but has significantly decreased in size,and continues to have some high attenuation material within some of the loculated spaces, as well as several loculations of gas. There is extensive varicose and cystic bronchiectasis in the right lower lobe adjacent to this multilocular pleural effusion, such that a bronchopleural fistula is not excluded. In addition, several of the areas of airspace consolidation noted on the prior study in the right middle lobe have largely improved, however, there is some cavitation in the remaining consolidated lung parenchyma, best appreciated on images 34 and 48 of series 3, which is new compared to the prior study. Small left-sided pleural effusion layering dependently. Small locule of gas in the inferior left hemithorax, may be pleural or may be within the inferior aspect of the left lower lobe (i.e., an additional area of cavitation). Small amount of loculated pleural fluid in the medial aspect of the upper hemithorax anteriorly. Diffuse lower lung predominant bronchial wall thickening and patchy areas of peribronchovascular ground-glass attenuation and micronodularity, presumably infectious.  Musculoskeletal/Soft Tissues: Median sternotomy wires.  Midline skin staples anterior to the sternum. Ankylosis throughout the thoracic spine. There are no aggressive appearing lytic or blastic lesions noted in the visualized portions of the skeleton.  CT ABDOMEN FINDINGS  LVAD-Specific Findings: The left ventricular assist device is in a pocket in the anterior aspect of the peritoneal cavity in the left upper quadrant. The device is surrounded by a moderate to large volume of fluid with multiple locules of gas, best demonstrated by an air-fluid level on image 84 of series 2. As discussed above, the volume of fluid and gas is greater than expected for someone most recently 2 weeks postoperative. There does not appear to be fluid adjacent to the drive line once the drive line exits the anterior abdominal wall within the subcutaneous fat of the right side of the upper abdomen. The more proximal aspects of the drive line are surrounded by fluid in the upper peritoneal cavity.  Hepatobiliary: 6 mm calcified gallstone lying dependently in the gallbladder. No current findings to suggest an acute cholecystitis at this time. The unenhanced appearance of the liver is unremarkable.  Pancreas: Unremarkable.  Spleen: Nodular contour of the spleen.  Otherwise, unremarkable.  Adrenals/Urinary Tract: Bilateral adrenal glands are normal in  appearance. Numerous nonobstructive calculi are present within the collecting systems of the kidneys bilaterally, largest of which measure up to 6 mm in the lower pole collecting system of the right kidney. Within the visualized portions of the abdomen there are no ureteral stones, and there is no hydroureteronephrosis to indicate urinary tract obstruction at this time.  Stomach/Bowel: The unenhanced appearance of the visualized stomach, small bowel and colon is unremarkable.  Vascular/Lymphatic: IVC filter in position with tip terminating below the level of the renal veins. No significant atherosclerotic disease in the abdominal vasculature. No  lymphadenopathy noted in the abdomen.  Other: LVAD specific findings, as above. No significant volume of ascites. Other than the gas within the pocket around the LVAD, there is no additional pneumoperitoneum.  Musculoskeletal: There are no aggressive appearing lytic or blastic lesions noted in the visualized portions of the skeleton.  IMPRESSION: 1. LVAD in position, surrounded by a large amount of gas and fluid. These findings are typically associated with infection of the surrounding pocket, however, given the recent surgical reexploration and yesterday's removal of a surgical drain, there are other potential explanations for these findings. Clinical correlation is recommended. 2. Extensive varicose and cystic bronchiectasis in the posterior aspect of the right lower lobe, adjacent to a complex multilocular pleural fluid collection with some residual pleural gas. These findings remain concerning for potential bronchopleural fistula, with adjacent empyema, however, this size of this collection has significantly decreased compared to the prior examination. 3. Areas of airspace consolidation noted on the prior study in the right middle lobe have improved, however, there is now internal cavitation within these regions in the right middle lobe, which suggests a resolving necrotizing pneumonia 4. In addition, there is a single locule of gas in the base of the left hemithorax. It is uncertain whether or not this is within the pleural space in the midst of a small left pleural effusion, or is within the inferior aspect of the wall left lower lobe lung parenchyma, in which case is could be an additional area of cavitation. 5. Bilateral nonobstructive nephrolithiasis. 6. Additional incidental findings, as detailed above.  These results were called by telephone at the time of interpretation on 10/21/2014 at 1:16 pm to Dr. Ivin Poot , who verbally acknowledged these results.   Electronically Signed   By: Vinnie Langton  M.D.   On: 10/21/2014 13:27     Medications:     Scheduled Medications: . antiseptic oral rinse  7 mL Mouth Rinse q12n4p  . budesonide-formoterol  2 puff Inhalation BID  . ceFEPime (MAXIPIME) IV  1 g Intravenous 3 times per day  . chlorhexidine  15 mL Mouth Rinse BID  . citalopram  20 mg Oral Daily  . dextromethorphan  30 mg Oral BID  . furosemide  40 mg Oral Daily  . insulin aspart  0-15 Units Subcutaneous TID WC  . insulin aspart  0-5 Units Subcutaneous QHS  . pantoprazole  40 mg Oral QHS  . sodium chloride  10 mL Intravenous Q12H  . traZODone  50 mg Oral QHS  . Warfarin - Physician Dosing Inpatient   Does not apply q1800    Infusions: . lactated ringers 20 mL/hr at 10/21/14 1554    PRN Medications: acetaminophen, fentaNYL (SUBLIMAZE) injection, hydrALAZINE, levalbuterol, ondansetron (ZOFRAN) IV, RESOURCE THICKENUP CLEAR, sodium chloride, traMADol   Assessment:    1.  A/C systolic HF class IV with cardiogenic shock- EF 15% with moderate RV dysfunction 3/16 echo.         -  S/P HMII LVAD 09/21/14  2. CAD s/p Anterior STEMI on 06/12/14 with stenting of LAD-off Plavix.  3. Acute on chronic hypercarbic respiratory failure  4. 06/12/14 VT in setting of STEMI 5. Ankylosing spondylitis. 6. Bilateral PE with R DVT- S/P IVC filter 08/27/2014--->coumadin 7. Acute Blood Loss into pleural space:  Back to OR 4/5 and 4/6 for recurrent bleed. Received multiple blood products.  8. PAF: On amiodarone.  9. Nose Bleed.-  Plan/Discussion:   S/P 09/21/2014 HMII LVAD.   Back to OR 4/5 and and again 4/6 for recurrent bleed and evacuation clots. Received multiple blood products. Hemoglobin stable 8.7  Continues to progress. Weight up 3 pounds. Off diuretics.    WBC at 21.6 >1>17.5 . ID appreciated. Antibiotics narrowed.  On cefepime. Wil review CT with Dr. [name].   On coumadin. INR 1.65. Goal INR 1.5-2. Watch closely had nose bleed over night. If happens again will need ENT.   Will  not restart Plavix as anterior wall completely necrotic.    Continue VAD teaching and ambulation. Home Health Orders placed for d/c . Will need to set up BIPAP. Consult care management.   Anticipate d/c Monday     I reviewed the LVAD parameters from today, and compared the results to the patient's prior recorded data.  No programming changes were made.  The LVAD is functioning within specified parameters.  The patient performs LVAD self-test daily.  LVAD interrogation was negative for any significant power changes, alarms or PI events/speed drops.  LVAD equipment check completed and is in good working order.  Back-up equipment present.   LVAD education done on emergency procedures and precautions and reviewed exit site care.   CLEGG,AMY NP-C  8:03 AM Length of Stay: 11  VAD Team --- VAD ISSUES ONLY--- Pager [phone] (7am - 7am)  Advanced Heart Failure Team  Pager [phone] (M-F; 7a - 4p)  Please contact [facility] for night-coverage after hours (4p -7a ) and weekends on [email or web address]  Patient seen and examined with Darrick Grinder, NP. We discussed all aspects of the encounter. I agree with the assessment and plan as stated above.   Doing well. Will review chest CT further with Dr. [name]. Ramp echo today. Continue to ambulate and educate. Plan d/c home Monday. VAD parameters stable.   Keeshawn Bensimhon,MD 8:27 AM

## 2014-10-22 NOTE — Consult Note (Signed)
PATIENT NAME:  Scott [name], BROUILLET MR#:  [id number] DATE OF BIRTH:  [birth year]  DATE OF CONSULTATION:  03/22/2013   CONSULTING PHYSICIAN:  Isaias Cowman, MD  PRIMARY CARE PHYSICIAN: Dr. Gilford Rile.  CARDIOLOGIST: Dr. Clayborn Bigness.   CHIEF COMPLAINT: Dr. Chest pain.   HISTORY OF PRESENT ILLNESS: The patient is a 69 year old gentleman with known coronary artery disease status post prior coronary stent in the left circumflex coronary artery 07/2011. The patient presented to [facility] Emergency Room with several week history of intermittent chest discomfort unrelated to exertion. On the day of admission, the patient had a prolonged episode of chest pain, took aspirin and presented to [facility] Emergency Room with 4/10 chest pain. The patient did have elevated blood pressure 173/77. Initial troponin was 0.02. The patient was admitted to telemetry. Follow-up troponin was elevated to 0.16. Chest pain has resolved. EKG was nondiagnostic.   PAST MEDICAL HISTORY: 1. Status post coronary stent in left circumflex 07/2011. 2.  Mildly reduced left ventricular function with LV of 40%.  3. Hypertension.  4. Hyperlipidemia.   MEDICATIONS: Aspirin 1 daily, Effient 10 mg  daily,  metoprolol tartrate 25 mg daily, Imdur 30 mg daily. Crestor 5 mg daily, Nexium 40 mg daily, metaxalone 800 mg daily p.r.n., indomethacin 75 mg b.i.d.   SOCIAL HISTORY: The patient currently is married, lives with his wife. Denies tobacco abuse.   FAMILY HISTORY: Positive for coronary artery disease.   REVIEW OF SYSTEMS:  CONSTITUTIONAL: No fever or chills.  EYES: No blurry vision.  EARS: No hearing loss.   RESPIRATORY: No shortness of breath.  CARDIOVASCULAR: Chest pain as described above.  GASTROINTESTINAL: No nausea, vomiting, diarrhea, or constipation.  GENITOURINARY: No dysuria or hematuria.  ENDOCRINE: No polyuria or polydipsia.  HEMATOLOGICAL: No easy bruising or bleeding.  MUSCULOSKELETAL: Positive for ankylosing spondylitis.   NEUROLOGICAL: No focal motor weakness.  PSYCHIATRIC: No depression or anxiety.   PHYSICAL EXAMINATION: VITAL SIGNS: Blood pressure 133/71, pulse 51, respirations 16, temperature 97.6, pulse oximetry 97%.  HEENT: Pupils equal, reactive to light and accommodation.  NECK: Supple without thyromegaly.  LUNGS: Clear.  HEART: Normal JVP. Normal PMI. Regular rate and rhythm. Normal S1, S2. No appreciable gallop, murmur, or rub.  ABDOMEN: Soft and nontender. Pulses were intact bilaterally.  MUSCULOSKELETAL: Normal muscle tone.  NEUROLOGIC: The patient is alert and oriented x3. Motor and sensory both grossly intact.   IMPRESSION: A 69 year old gentleman with known coronary artery disease, who presents with  chest pain consistent with unstable angina with elevated troponin.   RECOMMENDATIONS: 1. Agree with current therapy.  2. Continue Lovenox.  3. Proceed with cardiac catheterization with selective coronary arteriography on 03/23/2013 via left radial artery approach. The risks, benefits, and alternatives were explained and informed written consent obtained.   ____________________________ Isaias Cowman, MD ap:sg D: 03/22/2013 09:22:00 ET T: 03/22/2013 10:24:37 ET JOB#: [id number]  cc: Isaias Cowman, MD, <Dictator> Isaias Cowman MD ELECTRONICALLY SIGNED 04/13/2013 11:24

## 2014-10-22 NOTE — Progress Notes (Signed)
OT Cancellation Note  Patient Details Name: Scott [name] MRN: [id number] DOB: Feb 24, 1946   Cancelled Treatment:    Reason Eval/Treat Not Completed: Patient at procedure or test/ unavailable. Pt currently having a echocardiogram done in his room.  Almon Register [phone] 10/22/2014, 8:57 AM

## 2014-10-22 NOTE — Progress Notes (Signed)
10/22/2014 6:09 PM Pt. Ambulated 311ft. with front-wheel walker.  Did stop x1.  Otherwise tolerated well. Carney Corners

## 2014-10-22 NOTE — Discharge Summary (Signed)
PATIENT NAME:  Scott [name], Scott [name] MR#:  [id number] DATE OF BIRTH:  1946-06-30  DATE OF ADMISSION:  03/22/2013 DATE OF DISCHARGE:  03/24/2013  ADMITTING DIAGNOSIS: Unstable angina.   DISCHARGE DIAGNOSES 1.  Unstable angina with minimal elevation of troponin, not acute coronary syndrome or myocardial infarction per cardiology.  2.  Status post cardiac catheterization 03/23/2013 which revealed 75% to 80% stenosis of proximal left anterior descending artery, patent stent in proximal left circumflex, also cardiomyopathy with ejection fraction of 30%.  3.  Bradycardia due to beta blockers.  4.  Hypertension.  5.  Hyperlipidemia with LDL of 93.   DISCHARGE CONDITION: Stable.   DISCHARGE MEDICATIONS: The patient is to resume since indomethacin 75 mg p.o. twice daily with food, metaxalone 800 mg once daily as needed, Nexium 40 mg daily, aspirin 81 mg p.o. daily, lisinopril 5 mg p.o. daily. New medication nitroglycerin 0.4 mg sublingually every 5 minutes as needed, Crestor 10 mg p.o. daily at bedtime (new dose).  The patient was advised not to take Effient or and metoprolol unless recommended by Dr. Clayborn Bigness.   DIET: Two grams salt, low fat, low cholesterol, regular consistency.   ACTIVITY LIMITATIONS: As tolerated.    FOLLOWUP APPOINTMENT: With Dr. Clayborn Bigness in 1 week after discharge.   CONSULTANTS: Dr. Saralyn Pilar.   RADIOLOGIC STUDIES: Chest x-ray, portable, single view, 03/21/2013 revealed no acute disease of the chest. Cardiac catheterization 03/23/2013 revealing coronary circulation right dominant, proximal LAD 80% stenosis, proximal circumflex 50% stenosis at the site of prior stent left circumflex. There was a tubular 50% stenosis, distal RCA was 50% stenosis and near 75% to 80% stenosis in the proximal LAD involving the takeoff a large DG and 2 septal perforator branches appears to have progressed since cardiac catheterization done in 07/2011. Recommended Lexiscan stress test and if anterior  ischemia is observed then coronary artery bypass grafting. Patient underwent stress test on the 23rd of September 2014, which showed moderately to severely depressed left ventricular function, ejection fraction of 30% to 35%, moderate-sized lateral apical septum defect. No significant ischemia anteriorly or apically noted. Recommend medical therapy for now. Overall there was noted to be low-risk scan. Pharmacological myocardial perfusion study with no significant ischemia, estimated ejection fraction of 30%. Left ventricular global function was moderately reduced. There was no EKG changes concerning for ischemia. There was also no artifact noted in this study.  There was wall motion abnormality noted in globally depressed with lateral hypokinetic wall read by Dr. Clayborn Bigness.    HOSPITAL COURSE:  The patient is a 69 year old Caucasian male with history of coronary artery disease, who presents to the hospital on 03/21/2013 with complaints of chest pressure. Please refer to Dr. Gus Height Patel's admission note on 03/21/2013. On arrival to the hospital, the patient's vital signs showed that he was afebrile with pulse of 30s to 40s, blood pressure 173/77, pulse oximetry was 98% on room air. Physical exam was unremarkable.  The patient's lab data done in the Emergency Room on 03/21/2013 revealed elevation of glucose to 150, otherwise BMP was unremarkable. The patient's liver enzymes showed elevation of AST to 38. The patient's CK total was mildly elevated at 249. CK-MB as well as troponin were within normal limits. On the second set, the patient's troponin was also normal, third set showed troponin elevation to 0.16, fourth set troponin elevation of 0.12 with normal MB as well as CK total fractions. TSH was normal at 4.12. CBC was within normal limits.  Coagulation panel was unremarkable. EKG showed sinus  brady at 57 beats per minute, frequent premature ventricular complexes, left axis deviation, pulmonary disease pattern,  left ventricular hypertrophy with QRS widening was also noted and nonspecific abnormalities. The patient's chest x-ray was unremarkable.   The patient was admitted to the hospital with diagnosis of unstable angina and his cardiac enzymes were cycled. Cardiology consultation was obtained and the patient was seen by Dr. Saralyn Pilar on the 03/22/2013. Dr. Saralyn Pilar felt that the patient needs to proceed to cardiac catheterization with selective coronary arteriography via a left radial artery approach.  The risks and benefits as well as alternatives were explained and informed consent was obtained. The patient underwent cardiac catheterization on 09/22//2014 and as mentioned above it revealed 1-vessel coronary artery disease with 75% to 80% stenosis in the proximal LAD involving the takeoff a large DG and 2 septal perforators, patent stent in proximal left circumflex was noted, tortuous aorta, unable to pass a pigtail catheter via right radial approach for LVG. The patient re-evaluation on 03/24/2013 and underwent a stress test on 03/24/2013. The patient's stress test showed a moderate-sized lateral apical (Dictation Anomaly)fixed defect but no ischemia in anterior or anterior apical area. Medical therapy was recommended. The patient was also noted to have cardiomyopathy with ejection fraction of 30% to 35%. This was discussed with the patient upon discharge. It was felt that the patient did have unstable angina which could have been related to his LAD stenosis; however, medical management was recommended by cardiology. The patient, however, was recommended to discontinue beta blockers due to significant and sustained bradycardia with heart rate in the 30s to 40s even on the day of discharge, 03/24/2013. The patient is to continue aspirin, advance dosage of Crestor as well as nitroglycerin as needed. He also was given a lisinopril prescription which he is to fill and continue to use for his cardiomyopathy. No congestive  heart failure was noted in this patient and his oxygenation remained stable on the day of discharge. The patient was ambulated and since he did not have any chest pains he is being discharged home for further evaluation by Dr. Clayborn Bigness 1 week after discharge.   In regards to hypertension, as mentioned above, the patient was (Dictation Anomaly) initiated on lisinopril and his beta blocker, metoprolol, was stopped. The patient would benefit from re-introduction of beta blocker such as Crestor at very lose doses in the next 1 week after discharge. We did not initiate him here on any beta blockers since his heart rate remained very low; however, he was noted to have elevation of heart rate to 110s intermittently on exertion.   In regards to hyperlipidemia, the patient's lipid panel was rechecked and LDL was found to be 93, total cholesterol was found to be 156, triglycerides 114 and HDL of 40. It was felt that the patient would benefit from advancement of his Crestor dose since we would like to keep the patient's LDL below 70s if possible.   The patient is being discharged in stable condition with the above-mentioned medications and followup. On the day of discharge, the patient's vitals: Temperature is 98.2, pulse 40s to 50s, intermittently as high as 110; respiratory rate was 16 to 18, blood pressure 114/69 and saturation 97% on room air at rest.   TIME SPENT: 40 minutes.    ____________________________ Theodoro Grist, MD rv:cs D: 03/24/2013 17:39:36 ET T: 03/24/2013 18:23:49 ET JOB#: [id number]  cc: Theodoro Grist, MD, <Dictator> Dwayne D. Clayborn Bigness, MD Theodoro Grist MD ELECTRONICALLY SIGNED 04/07/2013 12:46

## 2014-10-22 NOTE — H&P (Signed)
PATIENT NAME:  Scott [name], Scott [name] MR#:  [id number] DATE OF BIRTH:  1945-07-12  DATE OF ADMISSION:  03/21/2013  PRIMARY CARE PHYSICIAN:  None  CARDIOLOGIST:  Dr. Clayborn Bigness  CHIEF COMPLAINT:  Chest pain on and off for the last several weeks.   HISTORY OF PRESENT ILLNESS:  Scott [name] is a 69 year old Caucasian gentleman with history of hypertension, hyperlipidemia and ankylosing spondylitis along with history of coronary artery disease, status post stent placement in left proximal circumflex in January 2013, comes to the Emergency Room not feeling well and complaining of chest pressure on and off for the last several weeks. The patient states he has been taking his nitro, beta blockers, aspirin. He does not have any sublingual nitro to take at home. He came to the Emergency Room with chest pressure of 4 out of 10, nonradiating to the jaws but some elevated blood pressure of 173/77. EKG does not show any acute ST elevation or depression. Shows old changes from sinus bradycardia with some ST-T changes in the past. His cardiac enzymes x 2 done was 0.02, 0.03. Given his symptoms more frequent for the last few days, he is being admitted for further evaluation and management.   PAST MEDICAL HISTORY: 1.  Hypertension.  2.  Hypercholesterolemia.  3.  Ankylosing spondylitis.  4.  Carotid artery stenosis, status post stent placement in the left proximal circumflex 100% stenosis, bare metal stent placed by Dr. Clayborn Bigness. He has EF of around 40% per cardiac cath.   MEDICATIONS: 1.  Nexium 40 mg daily.  2.  Metoprolol tartrate 25 mg daily.  3.  Metaxalone 800 mg p.o. daily as needed.  4.  Indomethacin 75 mg twice a day.  5.  Imdur 30 mg p.o. daily.  6.  Effient 10 mg daily.  7.  Crestor 5 mg daily.  8.  Aspirin 81 mg daily.   ALLERGIES:  No known drug allergies.   FAMILY HISTORY:  Positive for CAD in father, paternal uncle, cousin and brother.   SOCIAL HISTORY:  Nonsmoker, nonalcoholic. Lives with  his wife in [place]. The patient is retired.  REVIEW OF SYSTEMS:  CONSTITUTIONAL:  No fever, no fatigue or weakness.  EYES:  No blurred or double vision, glaucoma or cataracts.  ENT:  No tinnitus, ear pain, hearing loss, positive for dizziness.  RESPIRATORY:  No cough, wheeze, hemoptysis.  CARDIOVASCULAR:  No chest pain, orthopnea, edema. Positive for hypertension.  GASTROINTESTINAL:  No nausea, vomiting, diarrhea, abdominal pain or GERD.  GENITOURINARY:  No dysuria, hematuria or frequency.  ENDOCRINE:  No polyuria, nocturia or thyroid problems.  HEMATOLOGY:  No anemia or easy bruising.  SKIN:  No acne, rash or lesions.  MUSCULOSKELETAL:  Positive for ankylosing spondylitis, arthritis. No swelling or gout of the joints.  NEUROLOGIC:  No CVA, TIA or weakness.  PSYCHIATRIC:  No anxiety, depression or bipolar disorder.  All other systems reviewed and negative.   PHYSICAL EXAMINATION: GENERAL:  The patient is awake, alert, oriented x 3, not in acute distress.  VITAL SIGNS:  Afebrile, pulse is 46. Blood pressure is 173/77. Pulse is 46. Blood pressure is 173/77, sat 98% on room air.  HEENT: Atraumatic, normocephalic. Pupils PERRLA, EOMI. NECK:  Supple. No JVD. No carotid bruit.  RESPIRATORY: Clear to auscultation bilaterally. No rales, rhonchi, respiratory distress or labored breathing.  CARDIOVASCULAR: Both heart sounds are normal. Rate, rhythm is regular. PMI not lateralized. CHEST:  Nontender. EXTREMITIES:  Good pedal pulses, good femoral pulses. No lower extremity edema. ABDOMEN:  Soft,  benign, nontender. No organomegaly. Positive bowel sounds.  NEUROLOGIC:  Grossly intact cranial nerves II through XII. No motor or sensory deficit.  PSYCHIATRIC:  The patient is awake, alert, oriented x 3.  EKG shows marked sinus bradycardia with fusion complexes, left axis deviation, nonspecific ST-T abnormality.  Troponin 0.03, 0.02.INR is 0.9. CBC within normal limits. Comprehensive metabolic panel  within normal limits.   CHEST X-RAY: No acute cardiopulmonary disease.   ASSESSMENT AND PLAN:  69 year old Scott [name] with history of coronary artery disease, status post bare metal stent in January 2013, hyperlipidemia and hypertension, comes in with:  1.  Unstable angina.  The patient has being having chest pain on and off for the last several weeks, more chest pressure. Today with dizziness and diaphoresis. Will admit patient on telemetry floor, continue all home medications except hold off on the beta blockers given severe bradycardia. Will cycle cardiac enzymes x 3, have cardiology see patient in consultation. Will increase Imdur to 30 b.i.d., continue aspirin, Effient and nitroglycerin p.r.n. Will also give treatment dose for Lovenox 1  mg/kg SQ bid 2.  Coronary artery disease, status post bare metal stent and proximal circumflex in January 2013. The patient is on Effient and aspirin, which I will continue. We will continue Crestor p.r.n., nitroglycerin and increase Imdur to 30 mg b.i.d.  3.  Hypertension, on beta blockers. I will hold off on it given bradycardia.  4.  Hyperlipidemia.  Lipid profile done in February 2014 was with LDL of 126. Will not repeat it at this time.  5.  Deep vein thrombosis prophylaxis. The patient is already on Lovenox.  6.  Further workup according to the patient's clinical course. Hospital admission plan was discussed with the patient and the patient's wife.   TIME SPENT:  50 minutes   ____________________________ Gus Height A. Posey Pronto, MD sap:ce D: 03/21/2013 16:36:03 ET T: 03/21/2013 17:05:08 ET JOB#: [id number]  cc: Yailin Biederman A. Posey Pronto, MD, <Dictator> Dwayne D. Clayborn Bigness, MD Ilda Basset MD ELECTRONICALLY SIGNED 03/23/2013 13:13

## 2014-10-22 NOTE — Progress Notes (Signed)
Utilization review completed.  

## 2014-10-22 NOTE — Progress Notes (Signed)
10/22/2014 1100 Pt. Ambulated 677ft. With rolling walker.  Did have to take 1 rest halfway.  [place]

## 2014-10-22 NOTE — Progress Notes (Signed)
Speed  Flow  PI  Power  LVIDD  AI  AoV  Open MR  TR  Septum  RV Fcn MAP  9400 5.8 4.9 6.2 6.25 c mild 5/5 none  mild Sl bow to right Mild-mod hypokinetic 74   9600  5.9 5.0 6.3 6.3 c mild 4-5/5 none  Sl  Mild-mod hypokinetic 82  9800  6.2 4.7 6.9 5.8 c mild 5/5 none trivial Midline to sl L pull Mild-mod hypokinetic   10000  7.2 3.5 7.9 5.8 c  5/5 none                                   Ramp ECHO performed at bedside with Dr. Haroldine Laws and Colletta Maryland, [place] Coordinator. LVAD cannula is well visualized and in good position with the mitral valve.   At completion of ramp study, patients primary and back up controller were programmed at:  Fixed speed: 9800 Low speed limit:9200

## 2014-10-23 LAB — CULTURE, BLOOD (ROUTINE X 2)
CULTURE: NO GROWTH
Culture: NO GROWTH

## 2014-10-23 LAB — GLUCOSE, CAPILLARY
GLUCOSE-CAPILLARY: 100 mg/dL — AB (ref 70–99)
GLUCOSE-CAPILLARY: 162 mg/dL — AB (ref 70–99)
GLUCOSE-CAPILLARY: 128 mg/dL — AB (ref 70–99)
Glucose-Capillary: 120 mg/dL — ABNORMAL HIGH (ref 70–99)

## 2014-10-23 LAB — CBC
HCT: 29.2 % — ABNORMAL LOW (ref 39.0–52.0)
Hemoglobin: 9.1 g/dL — ABNORMAL LOW (ref 13.0–17.0)
MCH: 30.2 pg (ref 26.0–34.0)
MCHC: 31.2 g/dL (ref 30.0–36.0)
MCV: 97 fL (ref 78.0–100.0)
Platelets: 371 10*3/uL (ref 150–400)
RBC: 3.01 MIL/uL — ABNORMAL LOW (ref 4.22–5.81)
RDW: 19.2 % — ABNORMAL HIGH (ref 11.5–15.5)
WBC: 18 10*3/uL — ABNORMAL HIGH (ref 4.0–10.5)

## 2014-10-23 LAB — PROTIME-INR
INR: 1.41 (ref 0.00–1.49)
PROTHROMBIN TIME: 17.4 s — AB (ref 11.6–15.2)

## 2014-10-23 LAB — LACTATE DEHYDROGENASE: LDH: 469 U/L — ABNORMAL HIGH (ref 94–250)

## 2014-10-23 MED ORDER — FUROSEMIDE 40 MG PO TABS
40.0000 mg | ORAL_TABLET | ORAL | Status: DC
  Administered 2014-10-24: 40 mg via ORAL
  Filled 2014-10-23: qty 1

## 2014-10-23 MED ORDER — WARFARIN SODIUM 3 MG PO TABS
3.0000 mg | ORAL_TABLET | Freq: Once | ORAL | Status: AC
  Administered 2014-10-23: 3 mg via ORAL
  Filled 2014-10-23: qty 1

## 2014-10-23 MED ORDER — POTASSIUM CHLORIDE CRYS ER 20 MEQ PO TBCR
40.0000 meq | EXTENDED_RELEASE_TABLET | Freq: Once | ORAL | Status: AC
  Administered 2014-10-23: 40 meq via ORAL
  Filled 2014-10-23: qty 2

## 2014-10-23 MED ORDER — GUAIFENESIN ER 600 MG PO TB12
600.0000 mg | ORAL_TABLET | Freq: Two times a day (BID) | ORAL | Status: DC
  Administered 2014-10-23 – 2014-11-02 (×21): 600 mg via ORAL
  Filled 2014-10-23 (×22): qty 1

## 2014-10-23 NOTE — Progress Notes (Signed)
Pt ambulated 150 ft with RW, one sitting rest.  HR up to 140's during walk.  To chair for lunch with wife at side.  MAP slightly elevated, see flowsheet.  HR sustaining 120's.  WIll con't to monitor closely.

## 2014-10-23 NOTE — Consult Note (Signed)
PATIENT NAME:  Scott [name], Scott [name] MR#:  [id number] DATE OF BIRTH:  1945/08/07  DATE OF CONSULTATION:  06/28/2014  REQUESTING PHYSICIAN:  Frazier Richards, MD  CONSULTING PHYSICIAN:  Corey Skains, MD  REASON FOR CONSULTATION:  1.  Acute non-ST elevation myocardial infarction.  2.  Coronary artery disease.  3.  Chronic systolic dysfunction congestive heart failure.   CHIEF COMPLAINT: "I had chest pain."   HISTORY OF PRESENT ILLNESS:  This 69 year old male who has had a recent episode of acute ST elevation myocardial infarction with anterior myocardial infarction and severe LV systolic dysfunction.  At that time he had rhythm disturbances including ventricular tachycardia and ventricular fibrillation for which he was placed on amiodarone. He has not had any rhythm disturbances although has a life vest on at this time. The patient has been on appropriate medication management after PCI and stent placement of left anterior descending artery for which he is tolerating well.  He has had no evidence of significant new symptoms until the night before admission when he had some weakness, fatigue, shortness of breath, chest pain radiating into his back and some diaphoresis, lasting for several hours. He then was seen in the Emergency Room with no evidence of new EKG changes, but an elevation of troponin of 2.1 consistent with subendocardial myocardial infarction, which did not change his troponin a great deal, although was still consistent with new non-ST elevation myocardial infarction.  He was ambulating well on appropriate medication management with no further evidence of chest discomfort; and after review of further cardiac catheterization showing no evidence of other significant coronary artery disease needing further intervention at the time of cardiac catheterization several weeks prior, could medically manage his current issues.  Currently, he still is stable at this time without further symptoms.  Remainder of review of systems negative for vision change, ringing in the ears, hearing loss, cough, congestion, heartburn, nausea, vomiting, diarrhea, bloody stools, stomach pain, extremity pain, leg weakness, cramping of the buttocks, known blood clots, headaches, blackouts, dizzy spells, nosebleeds, congestion, trouble swallowing, frequent urination, urination at night, muscle weakness, numbness, anxiety, depression, skin lesions or skin rashes.   PAST MEDICAL HISTORY:  1.  Hypertension.  2.  Hyperlipidemia.  3.  Coronary artery disease. 4.  Heart failure.   FAMILY HISTORY: Multiple family members has history of cardiovascular disease and hypertension at an early age.   SOCIAL HISTORY: He currently denies alcohol or tobacco use.   ALLERGIES: As listed.   MEDICATIONS: As listed.   PHYSICAL EXAMINATION:  VITAL SIGNS: Blood pressure is 110/68 bilaterally. Heart rate 72 upright, reclining, and regular.  GENERAL: He is a well-appearing male in no acute distress.  HEENT: No icterus, thyromegaly, ulcers.   CARDIOVASCULAR: Regular rate and rhythm. Normal S1 and S2 with a 2/6 apical murmur consistent with mitral regurgitation. PMI is inferiorly displaced.  Carotid upstroke normal without bruit. Jugular venous pressure is normal.  LUNGS: Have few basilar crackles with normal respirations.  ABDOMEN: Soft, nontender, no apparent significant hepatosplenomegaly or masses. Abdominal aorta is normal size without bruit.  EXTREMITIES: 2+ radial, femoral, dorsal pedal pulses, with no lower extremity edema, cyanosis, clubbing or ulcers.  NEUROLOGIC: He is oriented to time, place, and person, with normal mood and affect.   ASSESSMENT: This is a 69 year old male with known coronary artery disease status post recent ST elevation myocardial infarction, chronic systolic dysfunction congestive heart failure, essential hypertension, mixed hyperlipidemia with acute non-ST elevation myocardial infarction appropriate  at this time for  medication management.   RECOMMENDATIONS:  1.  Continue beta blocker, ACE inhibitor, and nitrates for acute non-ST elevation myocardial infarction, and LV systolic dysfunction. 2.  Plavix and aspirin for further risk reduction in-stent thrombosis.  3.  Mixed hyperlipidemia with atorvastatin and high-intensity.  4.  Ambulation and follow up for any further significant side effects and or symptoms above and possible discharge to home with follow-up with Dr. Clayborn Bigness for further treatment of possible defibrillator placement at appropriate time.     ____________________________ Corey Skains, MD bjk:DT D: 06/29/2014 09:03:25 ET T: 06/29/2014 09:37:17 ET JOB#: [id number]  cc: Corey Skains, MD, <Dictator> Corey Skains MD ELECTRONICALLY SIGNED 06/30/2014 13:48

## 2014-10-23 NOTE — Progress Notes (Signed)
1445 Cardiac Rehab I have attempted to walk pt twice today. This am he refused and states that he had had a bad night and had not slept. He ask me to come back later. When I returned he had walked 150 feet with his wife and took one sitting rest stop. I attempted again at 1445 and he declined. Pt yelled at his wife that he was not going to walk any more today. Deon Pilling, RN 10/23/2014 3:01 PM

## 2014-10-23 NOTE — Progress Notes (Addendum)
Patient ID: DELANE WESSINGER, male   DOB: February 05, 1946, 69 y.o.   MRN: [id number] HeartMate 2 Rounding Note  Subjective:    S/P HMII LVAD on 09/20/2013.   Moved to ICU  4/5 due to low MAP and low hemoglobin. Taken Back to the OR 4/5 and 4/6  for recurrent bleed and evacuation of clots from left chest.    Ramp echo 4/22 and speed increased to 9800  Didn't sleep well. Feels tired.. Off abx. WBC 17.5 -> 18.0    INR 1.6 -> 1.4 LDH 409>448 >408>408 > 469 Hgb 8.9>8.8 >8.7 > 9.1  LVAD INTERROGATION:  HeartMate II LVAD:  Flow 5.2  liters/min, speed 9800, power 6.0, PI 6.3 No PI events.     Objective:    Vital Signs:   Temp:  [98.4 F (36.9 C)-99 F (37.2 C)] 98.4 F (36.9 C) (04/23 0419) Pulse Rate:  [101-109] 109 (04/23 0419) Resp:  [18] 18 (04/23 0419) SpO2:  [94 %-97 %] 94 % (04/23 0419) Weight:  [77.3 kg (170 lb 6.7 oz)] 77.3 kg (170 lb 6.7 oz) (04/23 0419) Last BM Date: 10/22/14 Mean arterial Pressure  80-90s Intake/Output:   Intake/Output Summary (Last 24 hours) at 10/23/14 0921 Last data filed at 10/23/14 1324  Gross per 24 hour  Intake    678 ml  Output   3025 ml  Net  -2347 ml     Physical Exam:  General: Sitting in chair HEENT: normal.  Neck: supple. Carotids 2+ bilat; no bruits. No lymphadenopathy or thryomegaly appreciated.  Cor: Mechanical heart sounds with LVAD hum present. Lungs: clear On room air.   Abdomen: soft, nontender, nondistended. No hepatosplenomegaly. No bruits or masses. Good bowel sounds. Driveline: Dressing looks good.  Extremities: no cyanosis, clubbing, rash.  No edema RUE PICC Neuro: Awake alert   Telemetry: NSR 90s    Labs: Basic Metabolic Panel:  Recent Labs Lab 10/17/14 0400 10/18/14 0401 10/19/14 0450 10/21/14 0450 10/22/14 0505  NA 148* 149* 146* 143 142  K 3.7 3.6 3.4* 3.3* 3.6  CL 108 111 108 104 105  CO2 33* 30 30 31 29   GLUCOSE 114* 117* 127* 101* 159*  BUN 12 12 11 12 13   CREATININE 0.81 0.82 0.78 0.77 0.84   CALCIUM 8.7 8.7 8.4 8.3* 8.3*  MG  --   --   --   --  1.9    Liver Function Tests:  Recent Labs Lab 10/17/14 0400 10/18/14 0401 10/19/14 0450  AST 36 32 38*  ALT 29 29 30   ALKPHOS 291* 282* 277*  BILITOT 1.0 0.9 0.7  PROT 5.8* 5.8* 5.4*  ALBUMIN 2.1* 2.1* 2.1*   No results for input(s): LIPASE, AMYLASE in the last 168 hours. No results for input(s): AMMONIA in the last 168 hours.  CBC:  Recent Labs Lab 10/19/14 0450 10/20/14 0512 10/21/14 0450 10/22/14 0505 10/23/14 0505  WBC 21.0* 21.6* 18.1* 17.5* 18.0*  HGB 9.0* 8.9* 8.8* 8.7* 9.1*  HCT 29.9* 29.5* 28.1* 28.6* 29.2*  MCV 98.7 98.3 97.6 99.0 97.0  PLT 441* 402* 363 328 371    INR:  Recent Labs Lab 10/19/14 0450 10/20/14 0512 10/21/14 0450 10/22/14 0505 10/23/14 0505  INR 1.81* 1.87* 1.77* 1.65* 1.41    Other results:    Imaging: Ct Abdomen Wo Contrast  10/21/2014   CLINICAL DATA:  69 year old male status post LVAD placement. Elevated white blood cell count. LVAD placement on 09/22/2014. Most recent surgery performed on 10/07/2014 (re-exploration and evacuation of hematoma).  Surgical drain the removed yesterday.  EXAM: CT CHEST AND ABDOMEN WITHOUT CONTRAST  TECHNIQUE: Multidetector CT imaging of the chest and abdomen was performed following the standard protocol without intravenous contrast.  COMPARISON:  Chest CT 09/19/2014.  FINDINGS: CT CHEST FINDINGS  Mediastinum/Lymph Nodes: Left ventricular assist device now noted, with the inflow cannula in the left ventricular apex directed toward the interventricular septum, and the outflow cannula anastomosing to the distal ascending thoracic aorta. A large amount of fluid is noted in the anterior mediastinum adjacent to the outflow cannula, and several small locules of gas are noted in this region, greater than expected for patient nearly 1 month following LVAD placement (and 2 weeks out from the follow-up left anterolateral thoracotomy and hematoma evacuation).  This fluid collection in the anterior mediastinum is predominantly low to intermediate attenuation, suggesting some residual proteinaceous or hemorrhagic contents, however, this is not frankly hemorrhagic. Although the heart remains mildly enlarged, overall cardiac size (both left ventricle and right ventricle) appears significantly improved compared to the prior study from 08/22/2014. There is atherosclerosis of the thoracic aorta, the great vessels of the mediastinum and the coronary arteries, including calcified atherosclerotic plaque in the left main, left anterior descending, left circumflex and right coronary arteries. Coronary artery stents are noted in the left anterior descending and left circumflex coronary arteries. Numerous borderline enlarged mediastinal and hilar lymph nodes are noted, and are nonspecific, but similar to the prior examination. Esophagus is unremarkable in appearance. No axillary lymphadenopathy. Right upper extremity PICC with tip terminating in the right atrium.  Lungs/Pleura: The complex multilocular right-sided pleural effusion is persistent but has significantly decreased in size,and continues to have some high attenuation material within some of the loculated spaces, as well as several loculations of gas. There is extensive varicose and cystic bronchiectasis in the right lower lobe adjacent to this multilocular pleural effusion, such that a bronchopleural fistula is not excluded. In addition, several of the areas of airspace consolidation noted on the prior study in the right middle lobe have largely improved, however, there is some cavitation in the remaining consolidated lung parenchyma, best appreciated on images 34 and 48 of series 3, which is new compared to the prior study. Small left-sided pleural effusion layering dependently. Small locule of gas in the inferior left hemithorax, may be pleural or may be within the inferior aspect of the left lower lobe (i.e., an additional  area of cavitation). Small amount of loculated pleural fluid in the medial aspect of the upper hemithorax anteriorly. Diffuse lower lung predominant bronchial wall thickening and patchy areas of peribronchovascular ground-glass attenuation and micronodularity, presumably infectious.  Musculoskeletal/Soft Tissues: Median sternotomy wires. Midline skin staples anterior to the sternum. Ankylosis throughout the thoracic spine. There are no aggressive appearing lytic or blastic lesions noted in the visualized portions of the skeleton.  CT ABDOMEN FINDINGS  LVAD-Specific Findings: The left ventricular assist device is in a pocket in the anterior aspect of the peritoneal cavity in the left upper quadrant. The device is surrounded by a moderate to large volume of fluid with multiple locules of gas, best demonstrated by an air-fluid level on image 84 of series 2. As discussed above, the volume of fluid and gas is greater than expected for someone most recently 2 weeks postoperative. There does not appear to be fluid adjacent to the drive line once the drive line exits the anterior abdominal wall within the subcutaneous fat of the right side of the upper abdomen. The more proximal aspects  of the drive line are surrounded by fluid in the upper peritoneal cavity.  Hepatobiliary: 6 mm calcified gallstone lying dependently in the gallbladder. No current findings to suggest an acute cholecystitis at this time. The unenhanced appearance of the liver is unremarkable.  Pancreas: Unremarkable.  Spleen: Nodular contour of the spleen.  Otherwise, unremarkable.  Adrenals/Urinary Tract: Bilateral adrenal glands are normal in appearance. Numerous nonobstructive calculi are present within the collecting systems of the kidneys bilaterally, largest of which measure up to 6 mm in the lower pole collecting system of the right kidney. Within the visualized portions of the abdomen there are no ureteral stones, and there is no  hydroureteronephrosis to indicate urinary tract obstruction at this time.  Stomach/Bowel: The unenhanced appearance of the visualized stomach, small bowel and colon is unremarkable.  Vascular/Lymphatic: IVC filter in position with tip terminating below the level of the renal veins. No significant atherosclerotic disease in the abdominal vasculature. No lymphadenopathy noted in the abdomen.  Other: LVAD specific findings, as above. No significant volume of ascites. Other than the gas within the pocket around the LVAD, there is no additional pneumoperitoneum.  Musculoskeletal: There are no aggressive appearing lytic or blastic lesions noted in the visualized portions of the skeleton.  IMPRESSION: 1. LVAD in position, surrounded by a large amount of gas and fluid. These findings are typically associated with infection of the surrounding pocket, however, given the recent surgical reexploration and yesterday's removal of a surgical drain, there are other potential explanations for these findings. Clinical correlation is recommended. 2. Extensive varicose and cystic bronchiectasis in the posterior aspect of the right lower lobe, adjacent to a complex multilocular pleural fluid collection with some residual pleural gas. These findings remain concerning for potential bronchopleural fistula, with adjacent empyema, however, this size of this collection has significantly decreased compared to the prior examination. 3. Areas of airspace consolidation noted on the prior study in the right middle lobe have improved, however, there is now internal cavitation within these regions in the right middle lobe, which suggests a resolving necrotizing pneumonia 4. In addition, there is a single locule of gas in the base of the left hemithorax. It is uncertain whether or not this is within the pleural space in the midst of a small left pleural effusion, or is within the inferior aspect of the wall left lower lobe lung parenchyma, in which  case is could be an additional area of cavitation. 5. Bilateral nonobstructive nephrolithiasis. 6. Additional incidental findings, as detailed above.  These results were called by telephone at the time of interpretation on 10/21/2014 at 1:16 pm to Dr. Ivin Poot , who verbally acknowledged these results.   Electronically Signed   By: Vinnie Langton M.D.   On: 10/21/2014 13:27   Ct Chest Wo Contrast  10/21/2014   CLINICAL DATA:  69 year old male status post LVAD placement. Elevated white blood cell count. LVAD placement on 09/22/2014. Most recent surgery performed on 10/07/2014 (re-exploration and evacuation of hematoma). Surgical drain the removed yesterday.  EXAM: CT CHEST AND ABDOMEN WITHOUT CONTRAST  TECHNIQUE: Multidetector CT imaging of the chest and abdomen was performed following the standard protocol without intravenous contrast.  COMPARISON:  Chest CT 09/19/2014.  FINDINGS: CT CHEST FINDINGS  Mediastinum/Lymph Nodes: Left ventricular assist device now noted, with the inflow cannula in the left ventricular apex directed toward the interventricular septum, and the outflow cannula anastomosing to the distal ascending thoracic aorta. A large amount of fluid is noted in the anterior  mediastinum adjacent to the outflow cannula, and several small locules of gas are noted in this region, greater than expected for patient nearly 1 month following LVAD placement (and 2 weeks out from the follow-up left anterolateral thoracotomy and hematoma evacuation). This fluid collection in the anterior mediastinum is predominantly low to intermediate attenuation, suggesting some residual proteinaceous or hemorrhagic contents, however, this is not frankly hemorrhagic. Although the heart remains mildly enlarged, overall cardiac size (both left ventricle and right ventricle) appears significantly improved compared to the prior study from 08/22/2014. There is atherosclerosis of the thoracic aorta, the great vessels of the  mediastinum and the coronary arteries, including calcified atherosclerotic plaque in the left main, left anterior descending, left circumflex and right coronary arteries. Coronary artery stents are noted in the left anterior descending and left circumflex coronary arteries. Numerous borderline enlarged mediastinal and hilar lymph nodes are noted, and are nonspecific, but similar to the prior examination. Esophagus is unremarkable in appearance. No axillary lymphadenopathy. Right upper extremity PICC with tip terminating in the right atrium.  Lungs/Pleura: The complex multilocular right-sided pleural effusion is persistent but has significantly decreased in size,and continues to have some high attenuation material within some of the loculated spaces, as well as several loculations of gas. There is extensive varicose and cystic bronchiectasis in the right lower lobe adjacent to this multilocular pleural effusion, such that a bronchopleural fistula is not excluded. In addition, several of the areas of airspace consolidation noted on the prior study in the right middle lobe have largely improved, however, there is some cavitation in the remaining consolidated lung parenchyma, best appreciated on images 34 and 48 of series 3, which is new compared to the prior study. Small left-sided pleural effusion layering dependently. Small locule of gas in the inferior left hemithorax, may be pleural or may be within the inferior aspect of the left lower lobe (i.e., an additional area of cavitation). Small amount of loculated pleural fluid in the medial aspect of the upper hemithorax anteriorly. Diffuse lower lung predominant bronchial wall thickening and patchy areas of peribronchovascular ground-glass attenuation and micronodularity, presumably infectious.  Musculoskeletal/Soft Tissues: Median sternotomy wires. Midline skin staples anterior to the sternum. Ankylosis throughout the thoracic spine. There are no aggressive appearing  lytic or blastic lesions noted in the visualized portions of the skeleton.  CT ABDOMEN FINDINGS  LVAD-Specific Findings: The left ventricular assist device is in a pocket in the anterior aspect of the peritoneal cavity in the left upper quadrant. The device is surrounded by a moderate to large volume of fluid with multiple locules of gas, best demonstrated by an air-fluid level on image 84 of series 2. As discussed above, the volume of fluid and gas is greater than expected for someone most recently 2 weeks postoperative. There does not appear to be fluid adjacent to the drive line once the drive line exits the anterior abdominal wall within the subcutaneous fat of the right side of the upper abdomen. The more proximal aspects of the drive line are surrounded by fluid in the upper peritoneal cavity.  Hepatobiliary: 6 mm calcified gallstone lying dependently in the gallbladder. No current findings to suggest an acute cholecystitis at this time. The unenhanced appearance of the liver is unremarkable.  Pancreas: Unremarkable.  Spleen: Nodular contour of the spleen.  Otherwise, unremarkable.  Adrenals/Urinary Tract: Bilateral adrenal glands are normal in appearance. Numerous nonobstructive calculi are present within the collecting systems of the kidneys bilaterally, largest of which measure up to 6 mm  in the lower pole collecting system of the right kidney. Within the visualized portions of the abdomen there are no ureteral stones, and there is no hydroureteronephrosis to indicate urinary tract obstruction at this time.  Stomach/Bowel: The unenhanced appearance of the visualized stomach, small bowel and colon is unremarkable.  Vascular/Lymphatic: IVC filter in position with tip terminating below the level of the renal veins. No significant atherosclerotic disease in the abdominal vasculature. No lymphadenopathy noted in the abdomen.  Other: LVAD specific findings, as above. No significant volume of ascites. Other than  the gas within the pocket around the LVAD, there is no additional pneumoperitoneum.  Musculoskeletal: There are no aggressive appearing lytic or blastic lesions noted in the visualized portions of the skeleton.  IMPRESSION: 1. LVAD in position, surrounded by a large amount of gas and fluid. These findings are typically associated with infection of the surrounding pocket, however, given the recent surgical reexploration and yesterday's removal of a surgical drain, there are other potential explanations for these findings. Clinical correlation is recommended. 2. Extensive varicose and cystic bronchiectasis in the posterior aspect of the right lower lobe, adjacent to a complex multilocular pleural fluid collection with some residual pleural gas. These findings remain concerning for potential bronchopleural fistula, with adjacent empyema, however, this size of this collection has significantly decreased compared to the prior examination. 3. Areas of airspace consolidation noted on the prior study in the right middle lobe have improved, however, there is now internal cavitation within these regions in the right middle lobe, which suggests a resolving necrotizing pneumonia 4. In addition, there is a single locule of gas in the base of the left hemithorax. It is uncertain whether or not this is within the pleural space in the midst of a small left pleural effusion, or is within the inferior aspect of the wall left lower lobe lung parenchyma, in which case is could be an additional area of cavitation. 5. Bilateral nonobstructive nephrolithiasis. 6. Additional incidental findings, as detailed above.  These results were called by telephone at the time of interpretation on 10/21/2014 at 1:16 pm to Dr. Ivin Poot , who verbally acknowledged these results.   Electronically Signed   By: Vinnie Langton M.D.   On: 10/21/2014 13:27     Medications:     Scheduled Medications: . antiseptic oral rinse  7 mL Mouth Rinse  q12n4p  . budesonide-formoterol  2 puff Inhalation BID  . chlorhexidine  15 mL Mouth Rinse BID  . citalopram  20 mg Oral Daily  . dextromethorphan  30 mg Oral BID  . furosemide  40 mg Oral Daily  . insulin aspart  0-15 Units Subcutaneous TID WC  . insulin aspart  0-5 Units Subcutaneous QHS  . pantoprazole  40 mg Oral QHS  . sodium chloride  10 mL Intravenous Q12H  . traZODone  50 mg Oral QHS  . Warfarin - Physician Dosing Inpatient   Does not apply q1800    Infusions: . lactated ringers 20 mL/hr at 10/22/14 1835    PRN Medications: acetaminophen, fentaNYL (SUBLIMAZE) injection, hydrALAZINE, levalbuterol, ondansetron (ZOFRAN) IV, RESOURCE THICKENUP CLEAR, sodium chloride, traMADol   Assessment:    1.  A/C systolic HF class IV with cardiogenic shock- EF 15% with moderate RV dysfunction 3/16 echo.         -S/P HMII LVAD 09/21/14  2. CAD s/p Anterior STEMI on 06/12/14 with stenting of LAD-off Plavix.  3. Acute on chronic hypercarbic respiratory failure  4. 06/12/14 VT in  setting of STEMI 5. Ankylosing spondylitis. 6. Bilateral PE with R DVT- S/P IVC filter 08/27/2014--->coumadin 7. Acute Blood Loss into pleural space:  Back to OR 4/5 and 4/6 for recurrent bleed. Received multiple blood products.  8. PAF: On amiodarone.  9. Nose Bleed.-  Plan/Discussion:   S/P 09/21/2014 HMII LVAD.   Back to OR 4/5 and and again 4/6 for recurrent bleed and evacuation clots. Received multiple blood products. Hemoglobin stable.  Didn't sleep well last night. Feels sluggish this am.   VAD speed adjusted yesterday with Ramp ECHO.   On coumadin. INR 1.4. Goal INR 1.5-2.   Will not restart Plavix as anterior wall completely necrotic.    Continue VAD teaching and ambulation. Home Health Orders placed for d/c . Will need to set up BIPAP. Care management consulted.   Anticipate d/c Monday. Need Bipap arranged - qualifies for Bipap WITHOUT sleep study based on pCO2 on ABG.     I reviewed the LVAD  parameters from today, and compared the results to the patient's prior recorded data.  No programming changes were made.  The LVAD is functioning within specified parameters.  The patient performs LVAD self-test daily.  LVAD interrogation was negative for any significant power changes, alarms or PI events/speed drops.  LVAD equipment check completed and is in good working order.  Back-up equipment present.   LVAD education done on emergency procedures and precautions and reviewed exit site care.   [name] MD  9:21 AM Length of Stay: 30  VAD Team --- VAD ISSUES ONLY--- Pager [phone] (7am - 7am)  Advanced Heart Failure Team  Pager [phone] (M-F; 7a - 4p)  Please contact [facility] for night-coverage after hours (4p -7a ) and weekends on [email or web address]

## 2014-10-23 NOTE — H&P (Signed)
PATIENT NAME:  Scott [name], Scott [name] MR#:  [id number] DATE OF BIRTH:  [birth year]  DATE OF ADMISSION:  06/26/2014  PRIMARY CARE PHYSICIAN: Dr. Gilford Rile.  PRIMARY CARDIOLOGIST: Shaune Pascal. Bensimhon, MD  CHIEF COMPLAINT: Weakness and shortness of breath.  HISTORY OF PRESENT ILLNESS: This is a 69 year old male who had an ST-elevation myocardial infarction 2 weeks ago and had stenting done at [facility] by Dr. Gwenlyn Found to his LAD for near-complete occlusion. He presents today to the ED with a complaint of weakness and shortness of breath. The patient states that he has been having these episodes which have been "ebbing and flowing" for the past 2 weeks since his MI, but that today the episode lasted quite a bit longer than normal. Of note, the patient has recently had his blood pressure medications titrated. He is on significant medications for his cardiovascular disease including amiodarone, carvedilol, digoxin, spironolactone, losartan. These medicines have been continually titrated to try to reach optimal dosing by his cardiologist. Three days ago he had his carvedilol dose increased to 6.25 mg. At that point the patient states that he felt like these episodes of weakness and fatigue started occurring more and started lasting longer. Today the episode started around 1:00 in the afternoon and lasted for about an hour and a half. Toward the end of the episode he started experiencing diaphoresis and some lightheadedness, and at that point decided he wanted to come in to the ED to be evaluated. Per his wife, his blood pressures have been running lower since he had his carvedilol dose increased. He had blood pressures running in the systolic 97L range for the past couple of days. The patient decided earlier this morning that he was going to decrease his carvedilol dose back to 3.125 mg b.i.d., which is what he had been taking prior to having it increased a couple of days ago. In the ED, the patient began to feel quite a  bit better. His symptoms seemed to pass, and his blood pressure measured here was borderline normal to a normal range. The ED physician contacted Dr. Gwenlyn Found at [facility], the physician who placed his stent when he had his STEMI a couple of weeks ago, to ask for his recommendation when they found a troponin elevated at 2.1 on the labs done here in the ED today. Dr. Gwenlyn Found recommended that we watch the patient overnight and continue to monitor his enzymes and, if they continue to rise, then he could be transferred to [facility] for intervention. If they remain the same or decreased, then it was likely residual troponin elevation from his STEMI 2 weeks ago. Of note, the patient's ejection fraction after his STEMI a couple of weeks ago was measured at 20%, and he was sent home on a LifeVest, which he still has on at this time. At this point, the ED physician contacted the hospitalists for admission.  PAST MEDICAL HISTORY: Ankylosing spondylitis, coronary artery disease status post ST-elevation MI 2 weeks ago, systolic congestive heart failure.   PAST SURGICAL HISTORY: Significant only for carpal tunnel release in 1987 and his recent heart catheterization with stent placement 2 weeks ago at [facility] by Dr. Gwenlyn Found.   FAMILY HISTORY: Includes coronary artery disease and Crohn disease.   ALLERGIES: No known drug allergies.   SOCIAL HISTORY: The patient lives at home with his wife. He is a never smoker, does not drink any alcohol, and denies any illicit drug use.   CURRENT MEDICATIONS: Amiodarone 200 mg daily,  carvedilol 3.125 mg b.i.d., aspirin 81 mg daily, Plavix 75 mg daily, digoxin 0.125 mg daily, indomethacin 75 mg daily, losartan 25 mg daily, metaxalone 800 mg daily, pantoprazole 40 mg daily, rosuvastatin 10 mg daily, spironolactone 12.5 mg daily.  REVIEW OF SYSTEMS:  CONSTITUTIONAL: Weakness and shortness of breath. The patient denies any fever, denies any significant pain or weight changes.   EYES: No blurring or double vision. No eye pain. No redness.  ENT: No ear pain. No hearing loss. No discharge. No sinus pain. No difficulty swallowing.  RESPIRATORY: No cough. No wheeze. No hemoptysis. The patient endorses mild shortness of breath during the episodes described in HPI. No pain with respiration.  CARDIOVASCULAR: No significant chest pain. The patient endorses orthopnea. The patient endorses very mild lower extremity edema. The patient denies palpitations or syncope.  GASTROINTESTINAL: Denies nausea, vomiting, diarrhea. Denies abdominal pain. Denies hematemesis or melena.  GENITOURINARY: The patient denies dysuria, hematuria, frequency, and incontinence.  HEMATOLOGIC: The patient denies easy bruising. The patient denies bleeding.  MUSCULOSKELETAL: The patient denies significant pain in his joints except for his lower back. The patient denies any significant arthritis except for his lower back. The patient denies gout. The patient denies joint swelling.  NEUROLOGIC: Denies numbness, weakness, dysarthria, headache, or seizures.  PSYCHIATRIC: The patient denies anxiety, depression, or insomnia.   PHYSICAL EXAMINATION:  VITAL SIGNS: Blood pressure 98.2, pulse 76, respirations 18, pulse oximetry 95% on room air, blood pressure 99/69. GENERAL APPEARANCE: The patient appears well nourished, in no acute distress lying in bed.  HEENT: Pupils equal, round, reactive to light. Extraocular movements intact. No scleral icterus. No difficulty hearing. Moist mucosal membranes. The patient has good dentition.  NECK: No thyromegaly. His neck is supple with no masses. It is nontender. There is no cervical lymphadenopathy.  RESPIRATORY: Clear to auscultation bilaterally with no rales, wheezes or rhonchi. His breathing is not labored. He is in no respiratory distress.  CARDIOVASCULAR: He has a regular rate, no murmur. No rubs or gallops. S1 and S2 present, as well as an intermittent S4. The patient has  good distal pulses. He has 1+ pedal edema, greater on the left than on the right.  ABDOMEN: Soft, nontender, nondistended. No masses palpated. No organomegaly palpated. He has good bowel sounds.  MUSCULOSKELETAL: There is no joint swelling. No clubbing, no cyanosis.  SKIN: Warm and dry without rashes or lesions  NEUROLOGICAL: Cranial nerves II through XII are grossly intact. Sensation is grossly intact throughout. There are no focal deficits detected. No dysarthria, no aphasia.  PSYCHIATRIC: The patient is alert and oriented x 3. He is cooperative, demonstrates good insight and good judgment.  PERTINENT LABORATORIES: Include a white blood cell count of 14.5, hemoglobin 12.6, platelets 563,000. Sodium 136, potassium 3.9, chloride 105, bicarbonate 22, creatinine 1.24, BUN 23, glucose 182, calcium of 8.4. Alkaline phosphatase elevated at 173, AST 36, with an ALT mildly elevated at 72. His troponin I was 2.1 on admission.  PERTINENT RADIOGRAPHIC IMAGING: Chest x-ray showed mild interstitial edema and bibasilar atelectasis with a very small left pleural effusion. His EKG showed an old anterolateral infarct consistent with his STEMI 2 weeks ago.   ASSESSMENT AND PLAN: 1.  Acute systolic congestive heart failure. The patient has a very low ejection fraction after his recent myocardial infarction. The patient states that his ejection fraction was measured at 20%. He has a LifeVest in place. He is on appropriate medications at this time. His medications do need titrating due to his  inability to tolerate some of them, as he has been having low blood pressures. We will continue these medications for now, except we will hold his spironolactone due to his low blood pressure. We will also check a digoxin level.  2.  Coronary artery disease. This is status post recent ST-elevation myocardial infarction with stent placement to his left anterior descending. His troponin is elevated here today. We will keep him overnight  to monitor that troponin as per recommendation of Dr. Gwenlyn Found at [facility]. If his troponin continues to rise, he will likely need transfer to [facility] for possible further intervention.  3.  Hypotension, likely due to his recent blood pressure medication increases, including his carvedilol increased to 6.25 mg b.i.d. which occurred 3 days ago. He feels that this increase may be contributing to these episodes of weakness. He already reduced the dose of his carvedilol back to 3.125 mg b.i.d. His hypotension was better in the Emergency Department, running a borderline low blood pressure with systolic blood pressure in the 90s, almost 100s. We will continue the majority of his blood pressure medications here except for his spironolactone as listed above.  4.  Ankylosing spondylitis. This is a longstanding diagnosis. The patient has been on chronic indomethacin. He is weaning this medication due to his recent heart pathology. He has followup scheduled with a specialist for change in treatment of this disease. We will continue his indomethacin at his current dose of 75 mg daily as well as his Skelaxin p.r.n. for his back pain and muscle spasm. 5.  Deep vein thrombosis prophylaxis includes mechanical prophylaxis with sequential compression devices.  CODE STATUS: This patient is full code.  TOTAL TIME SPENT ON THIS ADMISSION: 50 minutes.   ____________________________ Wilford Corner. Jannifer Franklin, MD dfw:ST D: 06/26/2014 18:59:51 ET T: 06/26/2014 21:56:30 ET JOB#: [id number]  cc: Wilford Corner. Jannifer Franklin, MD, <Dictator> Jamason Peckham Fawn Kirk MD ELECTRONICALLY SIGNED 06/27/2014 10:50

## 2014-10-24 ENCOUNTER — Inpatient Hospital Stay (HOSPITAL_COMMUNITY): Payer: Medicare Other

## 2014-10-24 LAB — BASIC METABOLIC PANEL
Anion gap: 6 (ref 5–15)
BUN: 11 mg/dL (ref 6–23)
CO2: 31 mmol/L (ref 19–32)
Calcium: 8.3 mg/dL — ABNORMAL LOW (ref 8.4–10.5)
Chloride: 105 mmol/L (ref 96–112)
Creatinine, Ser: 0.62 mg/dL (ref 0.50–1.35)
GFR calc Af Amer: >90 mL/min (ref >90)
GFR calc non Af Amer: >90 mL/min (ref >90)
Glucose, Bld: 103 mg/dL — ABNORMAL HIGH (ref 70–99)
Potassium: 3.8 mmol/L (ref 3.5–5.1)
Sodium: 142 mmol/L (ref 135–145)

## 2014-10-24 LAB — GLUCOSE, CAPILLARY
GLUCOSE-CAPILLARY: 114 mg/dL — AB (ref 70–99)
Glucose-Capillary: 102 mg/dL — ABNORMAL HIGH (ref 70–99)
Glucose-Capillary: 118 mg/dL — ABNORMAL HIGH (ref 70–99)
Glucose-Capillary: 109 mg/dL — ABNORMAL HIGH (ref 70–99)

## 2014-10-24 LAB — CBC
HCT: 28.3 % — ABNORMAL LOW (ref 39.0–52.0)
Hemoglobin: 8.6 g/dL — ABNORMAL LOW (ref 13.0–17.0)
MCH: 30.1 pg (ref 26.0–34.0)
MCHC: 30.4 g/dL (ref 30.0–36.0)
MCV: 99 fL (ref 78.0–100.0)
Platelets: 331 10*3/uL (ref 150–400)
RBC: 2.86 MIL/uL — ABNORMAL LOW (ref 4.22–5.81)
RDW: 19.4 % — ABNORMAL HIGH (ref 11.5–15.5)
WBC: 12.1 10*3/uL — ABNORMAL HIGH (ref 4.0–10.5)

## 2014-10-24 LAB — LACTATE DEHYDROGENASE: LDH: 459 U/L — ABNORMAL HIGH (ref 94–250)

## 2014-10-24 LAB — MAGNESIUM: Magnesium: 2 mg/dL (ref 1.5–2.5)

## 2014-10-24 LAB — PROTIME-INR
INR: 1.35 (ref 0.00–1.49)
Prothrombin Time: 16.8 seconds — ABNORMAL HIGH (ref 11.6–15.2)

## 2014-10-24 MED ORDER — WARFARIN SODIUM 4 MG PO TABS
4.0000 mg | ORAL_TABLET | ORAL | Status: AC
  Administered 2014-10-24: 4 mg via ORAL
  Filled 2014-10-24: qty 1

## 2014-10-24 MED ORDER — CARVEDILOL 3.125 MG PO TABS
3.1250 mg | ORAL_TABLET | Freq: Two times a day (BID) | ORAL | Status: DC
  Administered 2014-10-24 – 2014-10-26 (×4): 3.125 mg via ORAL
  Filled 2014-10-24 (×6): qty 1

## 2014-10-24 MED ORDER — SPIRONOLACTONE 12.5 MG HALF TABLET
12.5000 mg | ORAL_TABLET | Freq: Every day | ORAL | Status: DC
  Filled 2014-10-24: qty 1

## 2014-10-24 NOTE — Consult Note (Signed)
PCP None (moved about 6 months ago)dr Callwood Acute STEMI s/p cardiac cath with BMS in left circumflex (totally occluded)Integrillin and po effientvytorin spondylitispo indomethacinA1cnexium  dr Clayborn Bigness and pt's wife    Electronic Signatures: Ilda Basset (MD) (Signed on 15-Jan-13 18:54)  Authored   Last Updated: 15-Jan-13 18:56 by Ilda Basset (MD)

## 2014-10-24 NOTE — Consult Note (Signed)
PATIENT NAME:  TKAI, SERFASS MR#:  [id number] DATE OF BIRTH:  [birth year]  DATE OF CONSULTATION:  07/17/2011  REFERRING PHYSICIAN:  Lujean Amel, MD  CONSULTING PHYSICIAN:  Opel Lejeune A. Posey Pronto, MD  PRIMARY CARE PHYSICIAN: None.  REASON FOR CONSULTATION: Medical management. The patient has acute STEMI.   HISTORY OF PRESENT ILLNESS: Mr. Forero is a pleasant 69 year old Caucasian gentleman with history of hypertension not on any medications, hypercholesterolemia, and ankylosing spondylitis who came to the Emergency Room after he started having chest pain which got worse in intensity today after meal and would not get relieved after several hours at home. The patient had been having chest pain on and off for the past one week. He thought it was his acid reflux, however, today the pain started escalating, would not get relieved, came to the Emergency Room and initial EKG showed ST elevation in leads II and III along with ST depression in lateral leads. He was admitted with acute STEMI. He underwent urgent cardiac cath from the ER by Dr. Clayborn Bigness. On preliminary report, the patient did have total circumflex occluded and had bare metal stent placement. He is currently on IV Integrilin and started on p.o. Effient. Internal Medicine was consulted for management of medical problems.   PAST MEDICAL HISTORY:  1. Hyperlipidemia.  2. Ankylosing spondylitis.  3. Hypertension.   MEDICATIONS:  1. Vytorin 10/20 p.o. daily.  2. Nexium 40 mg daily.  3. Metaxalone 800 mg daily.  4. Indomethacin 75 mg b.i.d.  5. Aspirin 81 mg daily.   FAMILY HISTORY: Positive for coronary artery disease in father, paternal uncle, cousin, and brother.   SOCIAL HISTORY: Nonsmoker, nonalcoholic. Lives with his wife in [place]. The patient is retired.   REVIEW OF SYSTEMS: CONSTITUTIONAL: No fever, fatigue, weakness. EYES: No blurred or double vision. ENT: No tinnitus, ear pain, hearing loss. RESPIRATORY: No cough, wheeze,  hemoptysis. CARDIOVASCULAR: Positive for some chest pain, hypertension. GI: No nausea, vomiting, diarrhea, abdominal pain. Positive gastroesophageal reflux disease. GU: No dysuria or hematuria. ENDOCRINE: No polyuria or nocturia. HEMATOLOGY: No anemia or easy bruising. SKIN: No acne or rash. MUSCULOSKELETAL: Positive for arthritis and ankylosing spondylitis. NEUROLOGIC: No cerebrovascular accident or transient ischemic attack. PSYCH: No anxiety or depression. All other systems reviewed and negative.   PHYSICAL EXAMINATION:   GENERAL: The patient is awake, alert, and oriented x3 not in acute distress.   VITAL SIGNS: Afebrile, pulse 40 to 42, blood pressure 122/71, sats are 99% on 3 liters.   HEENT: Atraumatic, normocephalic. Pupils equal, round, and reactive to light and accommodation. Extraocular movements intact. Oral mucosa is moist.   NECK: Supple. No JVD. No carotid bruit.   RESPIRATORY: Clear to auscultation bilaterally. No rales, rhonchi, respiratory distress, or labored breathing.   CARDIOVASCULAR: Both the heart sounds are normal. Rate, rhythm is regular. PMI not lateralized. Chest nontender.   EXTREMITIES: Good pedal pulses. Good femoral pulses. No lower extremity edema.   ABDOMEN: Soft, benign, nontender. No organomegaly. Positive bowel sounds.   NEUROLOGIC: Grossly intact cranial nerves II through XII. No motor or sensory deficits.   PSYCH: The patient is awake, alert, and oriented x3.  LABORATORY, DIAGNOSTIC, AND RADIOLOGICAL DATA: Troponin 0.32. CK total 123. MB 2.9. PT/INR 12.4 and 0.9. CBC within normal limits. Glucose 159, BUN 23, creatinine 0.9. LFTs within normal limits except SGOT of 38. Lipase is 132.   ASSESSMENT: 69 year old Mr. Winrow with:  1. Acute STEMI status post cardiac cath with bare metal stent in circumflex that was totally  occluded. This is preliminary report given by Dr. Clayborn Bigness. The patient currently is on Integrilin and p.o. Effient along with  aspirin.  2. Hyperlipidemia. Continue Vytorin. The patient has been intolerant to Lipitor in the past.  3. Ankylosing spondylitis. On p.o. indomethacin.  4. Hyperglycemia.  5. Gastroesophageal reflux disease. On Nexium.    PLAN:  1. Continue cardiac medications as you are. Beta-blockers are contraindicated given bradycardia. Dr. Clayborn Bigness is aware.  2. Will continue the patient's home medications as before.  3. The patient will need primary care physician at discharge.  4. The patient also is requesting to be set up with a dermatologist in since he moved locally about six months ago.  5. Will check hemoglobin A1c. Start patient on sliding scale insulin. Treat according to the blood sugars while in-house.   Above was discussed with the patient and the patient's wife along with Dr. Clayborn Bigness who is agreeable to it. Further work-up according to the patient's clinical course.   Thank you for the consult. We will follow while the patient is in-house.   TIME SPENT: 50 minutes.   ____________________________ Hart Rochester Posey Pronto, MD sap:drc D: 07/17/2011 18:58:28 ET T: 07/17/2011 19:32:34 ET JOB#: [id number]  cc: Akshay Spang A. Posey Pronto, MD, <Dictator> Ilda Basset MD ELECTRONICALLY SIGNED 07/27/2011 7:28

## 2014-10-24 NOTE — Progress Notes (Addendum)
Patient ID: Scott [name], male   DOB: [birth year], 69 y.o.   MRN: [id number] HeartMate 2 Rounding Note  Subjective:    S/P HMII LVAD on 09/20/2013.   Moved to ICU  4/5 due to low MAP and low hemoglobin. Taken Back to the OR 4/5 and 4/6  for recurrent bleed and evacuation of clots from left chest.    Ramp echo 4/22 and speed increased to 9800  Had bad day yesterday. Was tired and cranky. Only walked once. Feels better today. Off abx. WBC 17.5 -> 18.0 -> 12.5 . 20 beats NSVT overnight. Getting lasix every other day.    INR 1.6 -> 1.35 LDH 409>448 >408>408 > 469 Hgb 8.9>8.8 >8.7 > 9.1  LVAD INTERROGATION:  HeartMate II LVAD:  Flow 6.2  liters/min, speed 9800, power 7.0, PI 5.1 No PI events.     Objective:    Vital Signs:   Temp:  [98.2 F (36.8 C)-98.6 F (37 C)] 98.6 F (37 C) (04/24 0544) Pulse Rate:  [104-108] 104 (04/24 0544) Resp:  [18] 18 (04/24 0544) SpO2:  [93 %-96 %] 96 % (04/24 0820) Weight:  [76.658 kg (169 lb)] 76.658 kg (169 lb) (04/24 0544) Last BM Date: 10/23/14 Mean arterial Pressure  72-92 Intake/Output:   Intake/Output Summary (Last 24 hours) at 10/24/14 0958 Last data filed at 10/24/14 0900  Gross per 24 hour  Intake    420 ml  Output   1051 ml  Net   -631 ml     Physical Exam:  General: Sitting in chair HEENT: normal.  Neck: supple. Carotids 2+ bilat; no bruits. No lymphadenopathy or thryomegaly appreciated.  Cor: Mechanical heart sounds with LVAD hum present. Lungs: clear On room air.   Abdomen: soft, nontender, nondistended. No hepatosplenomegaly. No bruits or masses. Good bowel sounds. Driveline: Dressing looks good.  Extremities: no cyanosis, clubbing, rash.  No edema RUE PICC Neuro: Awake alert   Telemetry: NSR 90s    Labs: Basic Metabolic Panel:  Recent Labs Lab 10/18/14 0401 10/19/14 0450 10/21/14 0450 10/22/14 0505 10/24/14 0603  NA 149* 146* 143 142 142  K 3.6 3.4* 3.3* 3.6 3.8  CL 111 108 104 105 105  CO2 30 30 31  29 31   GLUCOSE 117* 127* 101* 159* 103*  BUN 12 11 12 13 11   CREATININE 0.82 0.78 0.77 0.84 0.62  CALCIUM 8.7 8.4 8.3* 8.3* 8.3*  MG  --   --   --  1.9 2.0    Liver Function Tests:  Recent Labs Lab 10/18/14 0401 10/19/14 0450  AST 32 38*  ALT 29 30  ALKPHOS 282* 277*  BILITOT 0.9 0.7  PROT 5.8* 5.4*  ALBUMIN 2.1* 2.1*   No results for input(s): LIPASE, AMYLASE in the last 168 hours. No results for input(s): AMMONIA in the last 168 hours.  CBC:  Recent Labs Lab 10/20/14 0512 10/21/14 0450 10/22/14 0505 10/23/14 0505 10/24/14 0603  WBC 21.6* 18.1* 17.5* 18.0* 12.1*  HGB 8.9* 8.8* 8.7* 9.1* 8.6*  HCT 29.5* 28.1* 28.6* 29.2* 28.3*  MCV 98.3 97.6 99.0 97.0 99.0  PLT 402* 363 328 371 331    INR:  Recent Labs Lab 10/20/14 0512 10/21/14 0450 10/22/14 0505 10/23/14 0505 10/24/14 0603  INR 1.87* 1.77* 1.65* 1.41 1.35    Other results:    Imaging: No results found.   Medications:     Scheduled Medications: . antiseptic oral rinse  7 mL Mouth Rinse q12n4p  . budesonide-formoterol  2 puff Inhalation  BID  . chlorhexidine  15 mL Mouth Rinse BID  . citalopram  20 mg Oral Daily  . dextromethorphan  30 mg Oral BID  . furosemide  40 mg Oral QODAY  . guaiFENesin  600 mg Oral BID  . pantoprazole  40 mg Oral QHS  . sodium chloride  10 mL Intravenous Q12H  . traZODone  50 mg Oral QHS  . Warfarin - Physician Dosing Inpatient   Does not apply q1800    Infusions: . lactated ringers 20 mL/hr at 10/23/14 2000    PRN Medications: acetaminophen, fentaNYL (SUBLIMAZE) injection, hydrALAZINE, levalbuterol, ondansetron (ZOFRAN) IV, RESOURCE THICKENUP CLEAR, sodium chloride, traMADol   Assessment:    1.  A/C systolic HF class IV with cardiogenic shock- EF 15% with moderate RV dysfunction 3/16 echo.         -S/P HMII LVAD 09/21/14  2. CAD s/p Anterior STEMI on 06/12/14 with stenting of LAD-off Plavix.  3. Acute on chronic hypercarbic respiratory failure  4.  06/12/14 VT in setting of STEMI 5. Ankylosing spondylitis. 6. Bilateral PE with R DVT- S/P IVC filter 08/27/2014--->coumadin 7. Acute Blood Loss into pleural space:  Back to OR 4/5 and 4/6 for recurrent bleed. Received multiple blood products.  8. PAF: On amiodarone.  9. Nose Bleed.-  Plan/Discussion:   S/P 09/21/2014 HMII LVAD.   Back to OR 4/5 and and again 4/6 for recurrent bleed and evacuation clots. Received multiple blood products. Hemoglobin stable 8.7  Doing well. WBC down,  Had episode NSVT. No PI events. Will not turn down VAD speed at this point.   MAPs and HR elevated. Will stop lasix. Start spiro 12.5 and low-dose carvedilol.   On coumadin. INR 1.3. Goal INR 1.5-2. Will d/w Dr. [name]  Will not restart Plavix as anterior wall completely necrotic.    Continue VAD teaching and ambulation. Home Health Orders placed for d/c . Will need to set up BIPAP. Care management consulted.   Anticipate d/c Monday. Need Bipap arranged - qualifies for Bipap WITHOUT sleep study based on pCO2 on ABG.     I reviewed the LVAD parameters from today, and compared the results to the patient's prior recorded data.  No programming changes were made.  The LVAD is functioning within specified parameters.  The patient performs LVAD self-test daily.  LVAD interrogation was negative for any significant power changes, alarms or PI events/speed drops.  LVAD equipment check completed and is in good working order.  Back-up equipment present.   LVAD education done on emergency procedures and precautions and reviewed exit site care.   [name] MD  9:58 AM Length of Stay: 52  VAD Team --- VAD ISSUES ONLY--- Pager [phone] (7am - 7am)  Advanced Heart Failure Team  Pager [phone] (M-F; 7a - 4p)  Please contact [facility] for night-coverage after hours (4p -7a ) and weekends on [email or web address]

## 2014-10-24 NOTE — Consult Note (Signed)
Chief Complaint:   Subjective/Chief Complaint He states to have improved cp . Right groin is doing ok. no bleeding.   VITAL SIGNS/ANCILLARY NOTES: **Vital Signs.:   16-Jan-13 14:09   Vital Signs Type Routine   Temperature Temperature (F) 97.8   Celsius 36.5   Temperature Source oral   Pulse Pulse 61   Pulse source per Dinamap   Respirations Respirations 18   Systolic BP Systolic BP 161   Diastolic BP (mmHg) Diastolic BP (mmHg) 83   Mean BP 97   BP Source Dinamap   Pulse Ox % Pulse Ox % 95   Pulse Ox Activity Level  At rest   Oxygen Delivery Room Air/ 21 %  *Intake and Output.:   Shift 16-Jan-13 15:00   Grand Totals Intake:  610 Output:  1000    Net:  -390 24 Hr.:  -390   Oral Intake      In:  360   IV (Primary)      In:  250   Urine ml     Out:  1000   Length of Stay Totals Intake:  2542.4 Output:  2750    Net:  -207.6   Brief Assessment:   Cardiac Regular  murmur present  -- LE edema  -- JVD  --Gallop    Respiratory normal resp effort  clear BS    Gastrointestinal Normal    Gastrointestinal details normal Soft  Nontender  Nondistended  Bowel sounds normal    Additional Physical Exam Right groin site ok. No swelling or edema or bleeding     Cardiac:  15-Jan-13 15:01    Troponin I 0.32   CK, Total 123   CPK-MB, Serum 2.9  Routine Coag:  15-Jan-13 15:01    Prothrombin 12.4   INR 0.9   Activated PTT (APTT) 26.6  Routine Hem:  15-Jan-13 15:01    WBC (CBC) 13.5   RBC (CBC) 4.53   Hemoglobin (CBC) 14.3   Hematocrit (CBC) 43.1   Platelet Count (CBC) 378   MCV 95   MCH 31.6   MCHC 33.3   RDW 13.1  Routine Chem:  15-Jan-13 15:01    Glucose, Serum 159   BUN 23   Creatinine (comp) 0.94   Sodium, Serum 143   Potassium, Serum 3.9   Chloride, Serum 105   CO2, Serum 27   Calcium (Total), Serum 9.2  Hepatic:  15-Jan-13 15:01    Bilirubin, Total 0.4   Alkaline Phosphatase 102   SGPT (ALT) 41   SGOT (AST) 38   Total Protein, Serum 8.1   Albumin,  Serum 4.1  Routine Chem:  15-Jan-13 15:01    Osmolality (calc) 292   eGFR (African American) >60   eGFR (Non-African American) >60   Anion Gap 11   Lipase 133   Hemoglobin A1c ([facility]) 6.7  Cardiology:  15-Jan-13 15:53    Ventricular Rate 47   Atrial Rate 47   P-R Interval 156   QRS Duration 110   QT 498   QTc 440   P Axis 13   R Axis -38   T Axis 82    16:07    Ventricular Rate 52   Atrial Rate 52   P-R Interval 146   QRS Duration 108   QT 478   QTc 444   P Axis 19   R Axis -36   T Axis 68  Cardiac:  15-Jan-13 22:44    Troponin I > 40.00   CK, Total 2934  CPK-MB, Serum 406.1  Routine Chem:  16-Jan-13 03:10    Glucose, Serum 103   BUN 16   Creatinine (comp) 0.87   Sodium, Serum 143   Potassium, Serum 3.7   Chloride, Serum 105   CO2, Serum 25   Calcium (Total), Serum 8.4   Osmolality (calc) 286   eGFR (African American) >60   eGFR (Non-African American) >60   Anion Gap 13  Cardiac:  16-Jan-13 06:53    Troponin I > 40.00   CK, Total 2541   CPK-MB, Serum 314.3   Radiology Results:  XRay:    15-Jan-13 15:22, Chest Portable Single View   Chest Portable Single View    REASON FOR EXAM:    Chest Pain  COMMENTS:       PROCEDURE: DXR - DXR PORTABLE CHEST SINGLE VIEW  - Jul 17 2011  3:22PM     RESULT: There is no previous exam for comparison.    The lungs are clear. The heart and pulmonary vessels are normal. The bony   and mediastinal structures are unremarkable. There is no effusion. There   is no pneumothorax or evidence of congestive failure.    IMPRESSION:  No acute cardiopulmonary disease.          Verified By: Sundra Aland, M.D., MD  Cardiology:    15-Jan-13 15:53, ECG   Ventricular Rate 47   Atrial Rate 47   P-R Interval 156   QRS Duration 110   QT 498   QTc 440   P Axis 13   R Axis -38   T Axis 82   ECG interpretation    Marked sinus bradycardia  Left axis deviation  Voltage criteria for left ventricular hypertrophy  ST  elevation consider inferior injury or acute infarct  *** ** ** ** * ACUTE MI  ** ** ** **  Abnormal ECG  No previous ECGs available  ----------unconfirmed----------  Confirmed by OVERREAD, NOT ([id number]), editor PEARSON, BARBARA ([id number]) on 07/18/2011 10:42:38 AM   ECG     15-Jan-13 16:07, ECG   Ventricular Rate 52   Atrial Rate 52   P-R Interval 146   QRS Duration 108   QT 478   QTc 444   P Axis 19   R Axis -36   T Axis 68   ECG interpretation    Sinus bradycardia  Left axis deviation  Voltage criteria for left ventricular hypertrophy  ST elevation, consider early repolarization, pericarditis, or injury  Marked ST abnormality, possible lateral subendocardial injury  Abnormal ECG  No previous ECGs available  ----------unconfirmed----------  Confirmed by OVERREAD, NOT ([id number]), editor PEARSON, BARBARA ([id number]) on 07/18/2011 10:42:10 AM   ECG    Assessment/Plan:  Invasive Device Daily Assessment of Necessity:   Does the patient currently have any of the following indwelling devices? none    Assessment/Plan:   Assessment IMP STEMI CAD Malignant HTN Bradycardia Hyperlipidemia Alkylosing Spondylitis Abn Ekg    Plan PLAN F/U Ekgs Repeat troponins Transfered to tele Increased activity D/C lipitor and switch to crestor 5 daily Hold off on b-blockers for now 2nd to brady May add ACE for bp Continue Effient s/p stent Agree with Indocin for arthritis Hopefully d/c home in am   Electronic Signatures: Lujean Amel D (MD)  (Signed 16-Jan-13 20:22)  Authored: Chief Complaint, VITAL SIGNS/ANCILLARY NOTES, Brief Assessment, Lab Results, Radiology Results, Assessment/Plan   Last Updated: 16-Jan-13 20:22 by Lujean Amel D (MD)

## 2014-10-24 NOTE — H&P (Signed)
PATIENT NAME:  Scott [name] MR#:  [id number] DATE OF BIRTH:  [birth year]  DATE OF ADMISSION:  07/17/2011  ER REFERRING PHYSICIAN: Arman Filter, MD   ADMITTING PHYSICIAN: Arlene Genova D. Avante Carneiro, MD   INDICATION: STEMI, appears to be inferior wall.   HISTORY OF PRESENT ILLNESS: Scott [name] is a 69 year old white male with a history of ankylosing spondylitis, hypertension, family history of coronary artery disease, hyperlipidemia, who recently moved here and had not established with a primary doctor. He states that he was in his normal state of health and started having substernal chest pain off and on over the last week or two, but today it started at about lunchtime and became unrelenting. He finally decided to come to the Emergency Room about 2:00 or 2:30, which was about two hours into it.  The pain was midsternal, severe and persistent. There was no nausea, vomiting or fever. The pain waxed and waned but became reasonably persistent. He was seen in the Emergency Room, and subsequently an EKG was done which suggested acute inferior wall myocardial infarction, so Cardiology was then recommended. He presented within two hours of his symptoms starting. He may not have gotten an EKG immediately; it is not clear why, but once the EKG was obtained, then STEMI protocol was activated.   REVIEW OF SYSTEMS: Denies blackout spells or syncope. No nausea, no vomiting. Denies fever, chills, or sweats. No weight loss. No weight gain. No hemoptysis or hematemesis. No bright red blood per rectum.   PAST MEDICAL HISTORY:  1. Ankylosing spondylitis.  2. Hypertension.  3. Hyperlipidemia.   FAMILY HISTORY: Positive for coronary artery disease, hypertension, myocardial infarction.   SOCIAL HISTORY: He is married. He is retired. He is a nonsmoker, denies alcohol consumption.   MEDICATIONS:  1. Vytorin 10/20 mg once a day.  2. Nexium 40 mg a day.  3. Metaxalone 800 mg once a day.  4. Indomethacin 75 mg  b.i.d.   5. Aspirin 81 mg a day.   ALLERGIES: He states none.   PHYSICAL EXAMINATION:  VITAL SIGNS: Blood pressure was 180/80, pulse 55, respiratory rate 16, afebrile.   HEENT: Normocephalic, atraumatic. Pupils are equal and reactive to light.   NECK: Supple, denies significant jugular venous distention, bruits or adenopathy.   LUNGS: Clear to auscultation and percussion. No significant wheeze, rhonchi, or rales.   HEART: Regular rate and rhythm. Positive S4. Systolic ejection murmur at the apex.   ABDOMEN: Exam benign. Positive bowel sounds. No rebound, guarding or tenderness.   EXTREMITIES: Exam was within normal limits, no cyanosis, clubbing, or edema.   NEUROLOGIC: Exam was grossly intact.   SKIN: Exam was normal.   LABORATORY, DIAGNOSTIC AND RADIOLOGICAL DATA:  EKG had normal sinus rhythm, bradycardic, left ventricular hypertrophy, ST elevation inferiorly with reciprocal depressions laterally, incomplete right bundle branch block.  Troponin was 0.32. CK 123, with MB 2.9.  Prothrombin time was normal. PTT was normal. White count 13.5, hemoglobin and hematocrit of 14 and 43, platelet count of 378.  MET-B: Sodium of 143, potassium 3.9, chloride of 105, CO2 27, BUN and creatinine at 23 and 0.94, glucose of 159.  LFTs are unremarkable. Lipase 133.  Hemoglobin A1c was 6.7.  Chest x-ray was essentially negative.  ASSESSMENT:  1. Acute coronary syndrome, STEMI with possible inferior wall myocardial infarction.  2. Malignant hypertension.  3. Ankylosing spondylitis.  4. Hyperlipidemia.  5. Strong family history.  6. Abnormal EKG.   PLAN:  1. I agree with admit. Place on anticoagulation,  institute heparin therapy, nitrates, aspirin. We will hold off on beta blockers because of bradycardia. We will institute  Effient instead of Plavix because he is on Nexium.  2. We will take him directly to the Cardiac Cath Lab for possible  intervention and see if the patient responds to  therapy with the hope of relieving the obstruction with a stent.  3. We will continue other current medications as necessary. We will probably adjust his lipid medicines from Vytorin to, hopefully, Lipitor and Crestor. We will probably add an ACE inhibitor to treat the hypertension.  4. We will consider evaluation of his renal arteries, with the presentation of malignant hypertension.  5. We will base further evaluation on the results of these studies.   ____________________________ Loran Senters Scott Bigness, MD ddc:cbb D: 07/18/2011 11:18:51 ET T: 07/18/2011 11:36:21 ET JOB#: [id number]  cc: Taleshia Scott [name] D. Scott Bigness, MD, <Dictator> Scott Kida MD ELECTRONICALLY SIGNED 07/19/2011 14:18

## 2014-10-24 NOTE — Consult Note (Signed)
Chief Complaint:   Subjective/Chief Complaint He feels well today. Active and walking around well. No cp.   VITAL SIGNS/ANCILLARY NOTES: **Vital Signs.:   17-Jan-13 09:00   Vital Signs Type Routine   Temperature Temperature (F) 98.7   Celsius 37   Temperature Source oral   Pulse Pulse 76   Pulse source per Dinamap   Respirations Respirations 20   Systolic BP Systolic BP 974   Diastolic BP (mmHg) Diastolic BP (mmHg) 71   Mean BP 89   BP Source Dinamap   Pulse Ox % Pulse Ox % 96   Pulse Ox Activity Level  At rest   Oxygen Delivery Room Air/ 21 %  *Intake and Output.:   Daily 17-Jan-13 07:00   Grand Totals Intake:  850 Output:  1700    Net:  -850 24 Hr.:  -850   Oral Intake      In:  600   IV (Primary)      In:  250   Urine ml     Out:  1700   Length of Stay Totals Intake:  2782.4 Output:  3450    Net:  -667.6    08:17   Grand Totals Intake:  0 Output:      Net:  0 24 Hr.:  0   Oral Intake      In:  0   Percentage of Meal Eaten  100    Shift 15:00   Grand Totals Intake:  0 Output:      Net:  0 24 Hr.:  0   Oral Intake      In:  0   Length of Stay Totals Intake:  2782.4 Output:  1638    Net:  -667.6   Brief Assessment:   Cardiac Regular  murmur present  -- LE edema  -- JVD  --Gallop    Respiratory normal resp effort  clear BS    Gastrointestinal Normal    Gastrointestinal details normal Soft  Nontender  Nondistended  Bowel sounds normal    Additional Physical Exam Right groin site ok. No swelling or edema or bleeding   Routine Chem:  16-Jan-13 03:10    Glucose, Serum 103   BUN 16   Creatinine (comp) 0.87   Sodium, Serum 143   Potassium, Serum 3.7   Chloride, Serum 105   CO2, Serum 25   Calcium (Total), Serum 8.4   Osmolality (calc) 286   eGFR (African American) >60   eGFR (Non-African American) >60   Anion Gap 13  Cardiac:  16-Jan-13 06:53    Troponin I > 40.00   CK, Total 2541   CPK-MB, Serum 314.3  Cardiology:  17-Jan-13 07:12     Ventricular Rate 46   Atrial Rate 46   P-R Interval 156   QRS Duration 110   QT 508   QTc 444   P Axis 28   R Axis -41   T Axis -105   Radiology Results: XRay:    15-Jan-13 15:22, Chest Portable Single View   Chest Portable Single View    REASON FOR EXAM:    Chest Pain  COMMENTS:       PROCEDURE: DXR - DXR PORTABLE CHEST SINGLE VIEW  - Jul 17 2011  3:22PM     RESULT: There is no previous exam for comparison.    The lungs are clear. The heart and pulmonary vessels are normal. The bony   and mediastinal structures are unremarkable. There is no effusion. There  is no pneumothorax or evidence of congestive failure.    IMPRESSION:  No acute cardiopulmonary disease.          Verified By: Sundra Aland, M.D., MD  Cardiology:    15-Jan-13 15:53, ECG   Ventricular Rate 47   Atrial Rate 47   P-R Interval 156   QRS Duration 110   QT 498   QTc 440   P Axis 13   R Axis -38   T Axis 82   ECG interpretation    Marked sinus bradycardia  Left axis deviation  Voltage criteria for left ventricular hypertrophy  ST elevation consider inferior injury or acute infarct  *** ** ** ** * ACUTE MI  ** ** ** **  Abnormal ECG  No previous ECGs available  ----------unconfirmed----------  Confirmed by OVERREAD, NOT (100), editor PEARSON, BARBARA ([id number]) on 07/18/2011 10:42:38 AM   ECG     15-Jan-13 16:07, ECG   Ventricular Rate 52   Atrial Rate 52   P-R Interval 146   QRS Duration 108   QT 478   QTc 444   P Axis 19   R Axis -36   T Axis 68   ECG interpretation    Sinus bradycardia  Left axis deviation  Voltage criteria for left ventricular hypertrophy  ST elevation, consider early repolarization, pericarditis, or injury  Marked ST abnormality, possible lateral subendocardial injury  Abnormal ECG  No previous ECGs available  ----------unconfirmed----------  Confirmed by OVERREAD, NOT (100), editor PEARSON, BARBARA ([id number]) on 07/18/2011 10:42:10 AM   ECG     17-Jan-13 07:12,  ECG   Ventricular Rate 46   Atrial Rate 46   P-R Interval 156   QRS Duration 110   QT 508   QTc 444   P Axis 28   R Axis -41   T Axis -105   ECG interpretation    Marked sinus bradycardia  Left axis deviation  Voltage criteria for left ventricular hypertrophy  ST & T wave abnormality, consider inferolateral ischemia  Abnormal ECG  When compared with ECG of 17-Jul-2011 16:07,  ST no longer elevated in Inferior leads  ST less depressed in Lateral leads  T wave inversion more evident in Lateral leads  ----------unconfirmed----------  Confirmed by OVERREAD, NOT (100), editor PEARSON, BARBARA (1) on 07/19/2011 9:11:04 AM   ECG    Assessment/Plan:  Invasive Device Daily Assessment of Necessity:   Does the patient currently have any of the following indwelling devices? none   Assessment/Plan:   Assessment IMP S/P BMS to LCx ASCVD S/P STEMI CAD Malignant HTN Bradycardia Hyperlipidemia Alkylosing Spondylitis Abn Ekg    Plan PLAN EKG today improved Highly elevated troponins Heart track Increased activity D/C lipitor and switch to crestor 5 daily Liver studies/Lipid 30 days Hold off on b-blockers for now 2nd to brady May add ACE for bp Continue Effient s/p stent Agree with Indocin for arthritis D/C home today   Electronic Signatures: Lujean Amel D (MD)  (Signed 17-Jan-13 13:57)  Authored: Chief Complaint, VITAL SIGNS/ANCILLARY NOTES, Brief Assessment, Lab Results, Radiology Results, Assessment/Plan   Last Updated: 17-Jan-13 13:57 by Lujean Amel D (MD)

## 2014-10-24 NOTE — Progress Notes (Signed)
RT placed pt on BiPAP for the night. Pt tolerating well.

## 2014-10-24 NOTE — Progress Notes (Signed)
Patient had 20 beats VTACH.  Patient asymptomatic, resting comfortably.  RN will continue to monitor

## 2014-10-24 NOTE — Progress Notes (Signed)
[facility] for  Coumadin Indication: LVAD / Hx PE  No Known Allergies  Patient Measurements: Height: 5\' 8"  (172.7 cm) Weight: 169 lb (76.658 kg) IBW/kg (Calculated) : 68.4 Adjusted Body Weight: n/a   Labs:  Recent Labs  10/22/14 0505 10/23/14 0505 10/24/14 0603  WBC 17.5* 18.0* 12.1*  HGB 8.7* 9.1* 8.6*  PLT 328 371 331  CREATININE 0.84  --  0.62   Estimated Creatinine Clearance: 87.9 mL/min (by C-G formula based on Cr of 0.62). No results for input(s): VANCOTROUGH, VANCOPEAK, VANCORANDOM, GENTTROUGH, GENTPEAK, GENTRANDOM, TOBRATROUGH, TOBRAPEAK, TOBRARND, AMIKACINPEAK, AMIKACINTROU, AMIKACIN in the last 72 hours.   Lab Results  Component Value Date   INR 1.35 10/24/2014   INR 1.41 10/23/2014   INR 1.65* 10/22/2014    Assessment:  69 yo male s/p LVAD, and recent PE is on chronic warfarin.  INR goal 1.5-2 given recent bleeding.  CBC stable.  Most recently INR 1.87 after 2 days of 4mg  and then INR slowly trended back down to 1.3 today after lower doses.   Plan to d/c home tomorrow would recommend Warfarin 4mg  MF and 2mg  all other days - this is opposite of PTA dosing with lower INR goal.    Goal of Therapy:  INR 1.5-2  Plan:  Warfarin 4mg  x1 now for today's dose Plan d/c home tomorrow on Warfarin 4mg  MF / 2mg  all other days With close follow up in LVAD clinic  [place].D. CPP, BCPS Clinical Pharmacist [phone] 10/24/2014 10:51 AM

## 2014-10-25 LAB — CBC
HCT: 28.2 % — ABNORMAL LOW (ref 39.0–52.0)
Hemoglobin: 8.6 g/dL — ABNORMAL LOW (ref 13.0–17.0)
MCH: 30.1 pg (ref 26.0–34.0)
MCHC: 30.5 g/dL (ref 30.0–36.0)
MCV: 98.6 fL (ref 78.0–100.0)
Platelets: 320 10*3/uL (ref 150–400)
RBC: 2.86 MIL/uL — ABNORMAL LOW (ref 4.22–5.81)
RDW: 18.6 % — ABNORMAL HIGH (ref 11.5–15.5)
WBC: 8 10*3/uL (ref 4.0–10.5)

## 2014-10-25 LAB — PROTIME-INR
INR: 1.24 (ref 0.00–1.49)
Prothrombin Time: 15.7 seconds — ABNORMAL HIGH (ref 11.6–15.2)

## 2014-10-25 LAB — BASIC METABOLIC PANEL
Anion gap: 6 (ref 5–15)
BUN: 15 mg/dL (ref 6–23)
CO2: 31 mmol/L (ref 19–32)
Calcium: 8.1 mg/dL — ABNORMAL LOW (ref 8.4–10.5)
Chloride: 105 mmol/L (ref 96–112)
Creatinine, Ser: 0.68 mg/dL (ref 0.50–1.35)
GFR calc Af Amer: >90 mL/min (ref >90)
GFR calc non Af Amer: >90 mL/min (ref >90)
Glucose, Bld: 116 mg/dL — ABNORMAL HIGH (ref 70–99)
Potassium: 3.2 mmol/L — ABNORMAL LOW (ref 3.5–5.1)
Sodium: 142 mmol/L (ref 135–145)

## 2014-10-25 LAB — HEPARIN LEVEL (UNFRACTIONATED): Heparin Unfractionated: 0.16 IU/mL — ABNORMAL LOW (ref 0.30–0.70)

## 2014-10-25 LAB — LACTATE DEHYDROGENASE: LDH: 437 U/L — AB (ref 94–250)

## 2014-10-25 LAB — GLUCOSE, CAPILLARY
GLUCOSE-CAPILLARY: 84 mg/dL (ref 70–99)
Glucose-Capillary: 99 mg/dL (ref 70–99)
Glucose-Capillary: 84 mg/dL (ref 70–99)
Glucose-Capillary: 93 mg/dL (ref 70–99)

## 2014-10-25 MED ORDER — WARFARIN SODIUM 5 MG PO TABS
5.0000 mg | ORAL_TABLET | Freq: Once | ORAL | Status: AC
  Administered 2014-10-25: 5 mg via ORAL
  Filled 2014-10-25 (×3): qty 1

## 2014-10-25 MED ORDER — SODIUM CHLORIDE 0.9 % IV BOLUS (SEPSIS)
500.0000 mL | Freq: Once | INTRAVENOUS | Status: AC
  Administered 2014-10-25: 500 mL via INTRAVENOUS

## 2014-10-25 MED ORDER — SPIRONOLACTONE 12.5 MG HALF TABLET
12.5000 mg | ORAL_TABLET | Freq: Every day | ORAL | Status: DC
  Administered 2014-10-25 – 2014-10-26 (×2): 12.5 mg via ORAL
  Filled 2014-10-25 (×2): qty 1

## 2014-10-25 MED ORDER — HEPARIN (PORCINE) IN NACL 100-0.45 UNIT/ML-% IJ SOLN
1300.0000 [IU]/h | INTRAMUSCULAR | Status: DC
  Administered 2014-10-25: 1000 [IU]/h via INTRAVENOUS
  Administered 2014-10-26: 1300 [IU]/h via INTRAVENOUS
  Filled 2014-10-25 (×4): qty 250

## 2014-10-25 MED ORDER — SODIUM CHLORIDE 0.9 % IV BOLUS (SEPSIS)
1000.0000 mL | Freq: Once | INTRAVENOUS | Status: AC
  Administered 2014-10-25: 1000 mL via INTRAVENOUS

## 2014-10-25 MED ORDER — HEPARIN BOLUS VIA INFUSION
2500.0000 [IU] | Freq: Once | INTRAVENOUS | Status: AC
  Administered 2014-10-25: 2500 [IU] via INTRAVENOUS
  Filled 2014-10-25: qty 2500

## 2014-10-25 MED ORDER — POTASSIUM CHLORIDE CRYS ER 20 MEQ PO TBCR
40.0000 meq | EXTENDED_RELEASE_TABLET | Freq: Two times a day (BID) | ORAL | Status: AC
  Administered 2014-10-25 – 2014-10-27 (×6): 40 meq via ORAL
  Filled 2014-10-25 (×7): qty 2

## 2014-10-25 NOTE — Progress Notes (Signed)
Medicare Important Message given? YES  (If response is "NO", the following Medicare IM given date fields will be blank)  Date Medicare IM given: 10/25/14 Medicare IM given by:  Everardo Voris  

## 2014-10-25 NOTE — Progress Notes (Signed)
Placed pt on BiPAP for the night. Pt tolerating well.

## 2014-10-25 NOTE — Progress Notes (Signed)
[facility] for  Heparin>>Coumadin Indication: LVAD / Hx PE  No Known Allergies  Patient Measurements: Height: 5\' 8"  (172.7 cm) Weight: 168 lb 14 oz (76.6 kg) IBW/kg (Calculated) : 68.4 Adjusted Body Weight: n/a   Labs:  Recent Labs  10/23/14 0505 10/24/14 0603 10/25/14 0615  WBC 18.0* 12.1* 8.0  HGB 9.1* 8.6* 8.6*  PLT 371 331 320  CREATININE  --  0.62 0.68   Estimated Creatinine Clearance: 87.9 mL/min (by C-G formula based on Cr of 0.68). No results for input(s): VANCOTROUGH, VANCOPEAK, VANCORANDOM, GENTTROUGH, GENTPEAK, GENTRANDOM, TOBRATROUGH, TOBRAPEAK, TOBRARND, AMIKACINPEAK, AMIKACINTROU, AMIKACIN in the last 72 hours.   Lab Results  Component Value Date   INR 1.24 10/25/2014   INR 1.35 10/24/2014   INR 1.41 10/23/2014    Assessment:  69 yo male s/p LVAD, and recent PE is on chronic warfarin.  INR goal 1.5-2 given recent bleeding.  CBC stable.  Most recently INR 1.87 after 2 days of 4mg  and then INR slowly trended back down to 1.2 today after lower doses.    Plan to d/c home today has been cancelled.  D/w Bensimhon and PVT, will start low dose heparin today aiming for low end of goal (0.3-0.5) with slow titration and rate capping off at 1500 units/hr. Looking at previous rates we may not be able to reach therapeutic levels on this low rate. Given bleeding history PVT ok with current plan and will follow up rate adjustments as needed.    Nose bleeds have resolved, hgb stable. Normal BM noted yesterday.  PM HL = 0.16  Goal of Therapy:  INR 1.5-2 Heparin level goal 0.3-0.5  Plan:  Heparin to 1200 units / hr  Follow heparin level at midnight  Thank you. Anette Guarneri, PharmD Pager [phone] 10/25/2014 4:36 PM

## 2014-10-25 NOTE — Progress Notes (Signed)
Admitted 09/10/14 with worsening heart failure and cardiogenic shock.  HeartMate II LVAD implated on 09/21/14 by Dr. Darcey Nora as DT VAD.   Vital signs: Temp: 98.4 deg F HR:  86 SR Doppler MAP:  88 - 100 O2 Sat:  96%   Wt in lbs: 192 > 188 > 186 > 188 > 181 > 181 > 179 > 173 > 173 > 173 > 177>172>170>169>168  LVAD interrogation reveals:  Speed:  9800 Flow: 5.5 Power:  7.0 PI: 5.3 Alarms: none Events: several PI events Fixed speed:  9800 Low speed limit: 9200  Blood products: OR:   09/21/14:  1 unit PC, 2 plts, 3 FFP 10/05/14:  3 PC's, 500 cell saver 10/06/14:  2 PCs, 2 cryo, 300 cell saver  ICU:  09/22/14:   1 unit PC 10/05/14:  2 PCs, 2 FFP 10/06/14:  5 PCs, 2 FFP, 1 Plt, 1 cryo, 3 Factor VII, DDAVP  Nitric Oxide: off  10/11/14  ASA 81 mg:  Last dose (81 mg) 10/11/14  Warfarin:  INR goal 1.5 - 2.0  Labs:  LDH trend: 301>332>340>394>398>420>414>409>448>408>469>459>437  INR trend: 1.72>1.81>1.87>1.77>1.65>1.41>1.35>1.24  WBC:  20.6>22.9>21.0>21.6>18.1>17.5>18.0>12.1>8.0  Hgb: 8.8>9.1>9.3>9.3>9.0>8.9>8.8>8.7>9.1>8.6>8.6   Drive Line:  Gauze dressing dry and intact with attachment device in place. Daily dressing changes are being performed using sterile technique.   Plan/Recommendations and Discharge Needs as discussed with VAD team:  1. INR subtherapeutic; will start Heparin gtt. 2. VAD coordinator paged last night re: PI 3.7 with MAP 80, pt was asymptomatic. Dr. Haroldine Laws updated.

## 2014-10-25 NOTE — Progress Notes (Signed)
[facility] for  Heparin>>Coumadin Indication: LVAD / Hx PE  No Known Allergies  Patient Measurements: Height: 5\' 8"  (172.7 cm) Weight: 168 lb 14 oz (76.6 kg) IBW/kg (Calculated) : 68.4 Adjusted Body Weight: n/a   Labs:  Recent Labs  10/23/14 0505 10/24/14 0603 10/25/14 0615  WBC 18.0* 12.1* 8.0  HGB 9.1* 8.6* 8.6*  PLT 371 331 320  CREATININE  --  0.62 0.68   Estimated Creatinine Clearance: 87.9 mL/min (by C-G formula based on Cr of 0.68). No results for input(s): VANCOTROUGH, VANCOPEAK, VANCORANDOM, GENTTROUGH, GENTPEAK, GENTRANDOM, TOBRATROUGH, TOBRAPEAK, TOBRARND, AMIKACINPEAK, AMIKACINTROU, AMIKACIN in the last 72 hours.   Lab Results  Component Value Date   INR 1.24 10/25/2014   INR 1.35 10/24/2014   INR 1.41 10/23/2014    Assessment:  69 yo male s/p LVAD, and recent PE is on chronic warfarin.  INR goal 1.5-2 given recent bleeding.  CBC stable.  Most recently INR 1.87 after 2 days of 4mg  and then INR slowly trended back down to 1.2 today after lower doses.    Plan to d/c home today has been cancelled.  D/w Bensimhon and PVT, will start low dose heparin today aiming for low end of goal (0.3-0.5) with slow titration and rate capping off at 1500 units/hr. Looking at previous rates we may not be able to reach therapeutic levels on this low rate. Given bleeding history PVT ok with current plan and will follow up rate adjustments as needed.    Nose bleeds have resolved, hgb stable. Normal BM noted yesterday.  Goal of Therapy:  INR 1.5-2 Heparin level goal 0.3-0.5  Plan:  Warfarin 5mg  x1 now for today's dose Start heparin with 2500 unit bolus and rate of 1000/hr (slow titration) - 1600 HL Plan d/c home on Warfarin 4mg  MF / 2mg  all other days when stable With close follow up in LVAD clinic  Erin Hearing PharmD., BCPS Clinical Pharmacist Pager [phone] 10/25/2014 10:17 AM

## 2014-10-25 NOTE — Progress Notes (Signed)
Patient ID: Scott [name], male   DOB: 1946-03-05, 69 y.o.   MRN: [id number] HeartMate 2 Rounding Note  Subjective:    S/P HMII LVAD on 09/20/2013.   Moved to ICU  4/5 due to low MAP and low hemoglobin. Taken Back to the OR 4/5 and 4/6  for recurrent bleed and evacuation of clots from left chest.   Ramp echo 4/22 and speed increased to 9800  Yesterday lasix stopped and started 12.5 spiro. Weight down 1 pound.   Off abx. WBC 17.5 -> 18.0 -> 12.5>8 .  INR 1.6 -> 1.35> 1.25  LDH 437 Hgb 8.6  LVAD INTERROGATION:  HeartMate II LVAD:  Flow 5.6  liters/min, speed 9800, power 6.5  PI 5.3  Had 11 PI events overnight with PI down to 1.8 no arrhythmias noted.   Objective:    Vital Signs:   Temp:  [98.4 F (36.9 C)] 98.4 F (36.9 C) (04/24 2051) SpO2:  [96 %] 96 % (04/24 2253) Weight:  [168 lb 14 oz (76.6 kg)] 168 lb 14 oz (76.6 kg) (04/25 0507) Last BM Date: 10/24/14 Mean arterial Pressure  86-102 Intake/Output:   Intake/Output Summary (Last 24 hours) at 10/25/14 0704 Last data filed at 10/25/14 0500  Gross per 24 hour  Intake    120 ml  Output   1220 ml  Net  -1100 ml     Physical Exam:  General: In bed.  HEENT: normal.  Neck: supple. Carotids 2+ bilat; no bruits. No lymphadenopathy or thryomegaly appreciated.  Cor: Mechanical heart sounds with LVAD hum present. Lungs: RML RLL LLL crackles. On room air.   Abdomen: soft, nontender, nondistended. No hepatosplenomegaly. No bruits or masses. Good bowel sounds. Driveline: Dressing looks good.  Extremities: no cyanosis, clubbing, rash.  No edema RUE PICC Neuro: Awake alert   Telemetry: NSR 90s    Labs: Basic Metabolic Panel:  Recent Labs Lab 10/19/14 0450 10/21/14 0450 10/22/14 0505 10/24/14 0603 10/25/14 0615  NA 146* 143 142 142 142  K 3.4* 3.3* 3.6 3.8 3.2*  CL 108 104 105 105 105  CO2 30 31 29 31 31   GLUCOSE 127* 101* 159* 103* 116*  BUN 11 12 13 11 15   CREATININE 0.78 0.77 0.84 0.62 0.68  CALCIUM 8.4  8.3* 8.3* 8.3* 8.1*  MG  --   --  1.9 2.0  --     Liver Function Tests:  Recent Labs Lab 10/19/14 0450  AST 38*  ALT 30  ALKPHOS 277*  BILITOT 0.7  PROT 5.4*  ALBUMIN 2.1*   No results for input(s): LIPASE, AMYLASE in the last 168 hours. No results for input(s): AMMONIA in the last 168 hours.  CBC:  Recent Labs Lab 10/21/14 0450 10/22/14 0505 10/23/14 0505 10/24/14 0603 10/25/14 0615  WBC 18.1* 17.5* 18.0* 12.1* 8.0  HGB 8.8* 8.7* 9.1* 8.6* 8.6*  HCT 28.1* 28.6* 29.2* 28.3* 28.2*  MCV 97.6 99.0 97.0 99.0 98.6  PLT 363 328 371 331 320    INR:  Recent Labs Lab 10/21/14 0450 10/22/14 0505 10/23/14 0505 10/24/14 0603 10/25/14 0615  INR 1.77* 1.65* 1.41 1.35 1.24    Other results:    Imaging: Dg Chest 2 View  10/24/2014   CLINICAL DATA:  Cough, left ventricular assist device placement  EXAM: CHEST  2 VIEW  COMPARISON:  10/21/2014 CT, chest radiograph 10/19/2014  FINDINGS: Left ventricular assist device reidentified. Right-sided PICC line in place with tip over the cavoatrial junction. Evidence of median sternotomy with skin  staples. Moderate enlargement of the cardiac silhouette is again noted. Left lower lobe presumed granulomas reidentified. Patchy bibasilar airspace opacities and trace pleural effusions are stable. Support apparatus obscures the right upper quadrant.  IMPRESSION: No significant change in patchy bibasilar airspace opacities and trace pleural effusions.   Electronically Signed   By: Conchita Paris M.D.   On: 10/24/2014 10:19     Medications:     Scheduled Medications: . antiseptic oral rinse  7 mL Mouth Rinse q12n4p  . budesonide-formoterol  2 puff Inhalation BID  . carvedilol  3.125 mg Oral BID WC  . chlorhexidine  15 mL Mouth Rinse BID  . citalopram  20 mg Oral Daily  . dextromethorphan  30 mg Oral BID  . guaiFENesin  600 mg Oral BID  . pantoprazole  40 mg Oral QHS  . sodium chloride  10 mL Intravenous Q12H  . spironolactone  12.5  mg Oral Daily  . traZODone  50 mg Oral QHS  . Warfarin - Physician Dosing Inpatient   Does not apply q1800    Infusions: . lactated ringers 20 mL/hr at 10/23/14 2000    PRN Medications: acetaminophen, fentaNYL (SUBLIMAZE) injection, hydrALAZINE, levalbuterol, ondansetron (ZOFRAN) IV, RESOURCE THICKENUP CLEAR, sodium chloride, traMADol   Assessment:    1.  A/C systolic HF class IV with cardiogenic shock- EF 15% with moderate RV dysfunction 3/16 echo.         -S/P HMII LVAD 09/21/14  2. CAD s/p Anterior STEMI on 06/12/14 with stenting of LAD-off Plavix.  3. Acute on chronic hypercarbic respiratory failure  4. 06/12/14 VT in setting of STEMI 5. Ankylosing spondylitis. 6. Bilateral PE with R DVT- S/P IVC filter 08/27/2014--->coumadin 7. Acute Blood Loss into pleural space:  Back to OR 4/5 and 4/6 for recurrent bleed. Received multiple blood products.  8. PAF: On amiodarone.  9. Nose Bleed.-  10. Hypokalemia Plan/Discussion:   S/P 09/21/2014 HMII LVAD.   Back to OR 4/5 and and again 4/6 for recurrent bleed and evacuation clots. Received multiple blood products. Hemoglobin stable 8.7  Doing well. WBC down to 8.  11 PI events this am with PI down to 1.8. No arrhythmias. Will not turn down VAD speed at this point.   MAPs and HR elevated.  Increase 25 mg spiro. Continue low-dose carvedilol.   On coumadin. INR 1.24. Goal INR 1.5-2. Discussed with Dr. [name]. Start heparin. Pharmacy adjusting coumadin.    Will not restart Plavix as anterior wall completely necrotic.    Continue VAD teaching and ambulation. Home Health Orders placed for d/c . Will need to set up BIPAP. Care management consulted.   Hold discharge.  Need Bipap arranged - qualifies for Bipap WITHOUT sleep study based on pCO2 on ABG. Add wound care for mediastinal site.     I reviewed the LVAD parameters from today, and compared the results to the patient's prior recorded data.  No programming changes were made.  The  LVAD is functioning within specified parameters.  The patient performs LVAD self-test daily.  LVAD interrogation was negative for any significant power changes, alarms or PI events/speed drops.  LVAD equipment check completed and is in good working order.  Back-up equipment present.   LVAD education done on emergency procedures and precautions and reviewed exit site care.   CLEGG,AMY NP-C  7:04 AM Length of Stay: 72  VAD Team --- VAD ISSUES ONLY--- Pager [phone] (7am - 7am)  Advanced Heart Failure Team  Pager [phone] (M-F; 7a -  4p)  Please contact [facility] for night-coverage after hours (4p -7a ) and weekends on [email or web address]   Patient seen and examined with Darrick Grinder, NP. We discussed all aspects of the encounter. I agree with the assessment and plan as stated above.   Doing ok. WBC down. Lasix stopped yesterday but PI still low. Will turn speed down to 9600. INR too low for d/c. Will start IV heparin.   Continue to ambulate.   Zyir Remedy Corporan,MD 8:41 AM

## 2014-10-25 NOTE — Progress Notes (Signed)
Scott [name] is sitting up in recliner and says he had an episode of feeling "whoozy" earlier but is beginning to feel better. Noted fluid bolus and initiation of heparin gtt. Wife is at bedside and says that she is prepared and feels good about him coming home he is medically ready. She says that she would like to get him home and says she hopes months down the road they can look back and be glad they went through with this instead of regretting their decision. They seem content that they had to try and are still hopeful to have better days ahead of them. They are still optimistic but are also realistic. Support and encouragement provided.   Vinie Sill, NP Palliative Medicine Team Pager # [phone] (M-F 8a-5p) Team Phone # [phone] (Nights/Weekends)

## 2014-10-25 NOTE — Progress Notes (Signed)
VAD coordinator to patient room; pt c/o lightheadedness and dizziness after walking to bathroom.  MAP 68, Flow 3.9, speed 9800, PI 1.9 - 2.5, Power 5.3. Pt awake, skin warm and dry, assisted back to chair. Dr. Haroldine Laws updated - VAD speed decreased to 9600, NS bolus of 1 liter ordered.  Telemetry shows SR 58 - 60, EKG obtained. Dr. Haroldine Laws in to assess pt - feels pt is dry, drop in HR could be vagal. Will hydrate, monitor, and hold off on PT/OT for today. Nurse at bedside and aware of updated plan.

## 2014-10-25 NOTE — Progress Notes (Signed)
NUTRITION FOLLOW UP  Intervention:   Continue Magic cup TID with meals, each supplement provides 290 kcal and 9 grams of protein RD to follow for nutrition care plan  Nutrition Dx:   Increased nutrient needs related to LVAD as evidenced by estimated energy needs, ongoing  Goal:   Pt to meet >/= 90% of estimated energy needs, progressing  Monitor:   PO & supplemental intake, weight, labs, I/O's  Assessment:   69 y/o with history of CAD with stenting of LCX 2013, DM2, carotid stenting and ankylosing spondylitis. Presented with an anterolateral MI in 12/15 and was discharged with a life vest and medical therapy. Pt was admitted with worsening heart failure symptoms and has undergone right heart catheterization and is now going to be started on Milrinone and then LVAD.   Prior to intubation, pt was consuming 50-100% of meals on a regular diet. Ensure supplements were ordered, however, pt refused them due to preferring Magic Cups.    Patient s/p procedures 4/6: LEFT THORACOTOMY  EVACUATION OF LEFT CHEST HEMATOMA RE-EXPLORATION FOR BLEEDING  Patient extubated 4/10.    Pt continues on a Heart Healthy diet.  PO intake 50-75% per flowsheet records.  Receiving Magic Cup dessert supplements with meals to maximize kcal, protein intake.   Height: Ht Readings from Last 1 Encounters:  09/30/14 5\' 8"  (1.727 m)    Weight Status:   Wt Readings from Last 1 Encounters:  10/25/14 168 lb 14 oz (76.6 kg)   09/29/14 184 lb 8.4 oz (83.7 kg)   09/24/14 192 lb 7.4 oz (87.3 kg)            Body mass index is 25.68 kg/(m^2).  Re-estimated needs:  Kcal: 1900-2100 Protein: 125-135 grams Fluid: >2.1 L  Skin: closed lt chest incision, closed rt abdominal incision  Diet Order: Diet Heart Room service appropriate?: Yes; Fluid consistency:: Thin   Intake/Output Summary (Last 24 hours) at 10/25/14 1138 Last data filed at 10/25/14 1007  Gross per 24 hour  Intake      3 ml  Output    900  ml  Net   -897 ml    Labs:   Recent Labs Lab 10/22/14 0505 10/24/14 0603 10/25/14 0615  NA 142 142 142  K 3.6 3.8 3.2*  CL 105 105 105  CO2 29 31 31   BUN 13 11 15   CREATININE 0.84 0.62 0.68  CALCIUM 8.3* 8.3* 8.1*  MG 1.9 2.0  --   GLUCOSE 159* 103* 116*    CBG (last 3)   Recent Labs  10/24/14 2102 10/25/14 0538 10/25/14 1121  GLUCAP 114* 99 84    Scheduled Meds: . antiseptic oral rinse  7 mL Mouth Rinse q12n4p  . budesonide-formoterol  2 puff Inhalation BID  . carvedilol  3.125 mg Oral BID WC  . chlorhexidine  15 mL Mouth Rinse BID  . citalopram  20 mg Oral Daily  . dextromethorphan  30 mg Oral BID  . guaiFENesin  600 mg Oral BID  . pantoprazole  40 mg Oral QHS  . potassium chloride  40 mEq Oral BID  . sodium chloride  1,000 mL Intravenous Once  . sodium chloride  10 mL Intravenous Q12H  . spironolactone  12.5 mg Oral Daily  . traZODone  50 mg Oral QHS  . warfarin  5 mg Oral ONCE-1800  . Warfarin - Physician Dosing Inpatient   Does not apply q1800    Continuous Infusions: . heparin 1,000 Units/hr (10/25/14 1002)  .  lactated ringers 20 mL/hr at 10/23/14 2000    Arthur Holms, RD, LDN Pager #: [phone] After-Hours Pager #: [phone]

## 2014-10-25 NOTE — Progress Notes (Signed)
PT Cancellation Note  Patient Details Name: Scott [name] MRN: [id number] DOB: 10/09/1945   Cancelled Treatment:    Reason Eval/Treat Not Completed: Patient not medically ready (see LVAD coordinator's note re: dehydration and hold PT today)   Scott [name] 10/25/2014, 11:16 AM Pager [phone]

## 2014-10-26 ENCOUNTER — Inpatient Hospital Stay (HOSPITAL_COMMUNITY): Payer: Medicare Other

## 2014-10-26 LAB — CBC
HCT: 26 % — ABNORMAL LOW (ref 39.0–52.0)
HCT: 29.7 % — ABNORMAL LOW (ref 39.0–52.0)
HCT: 31.3 % — ABNORMAL LOW (ref 39.0–52.0)
Hemoglobin: 7.9 g/dL — ABNORMAL LOW (ref 13.0–17.0)
Hemoglobin: 9.1 g/dL — ABNORMAL LOW (ref 13.0–17.0)
Hemoglobin: 9.5 g/dL — ABNORMAL LOW (ref 13.0–17.0)
MCH: 29.6 pg (ref 26.0–34.0)
MCH: 29.8 pg (ref 26.0–34.0)
MCH: 29.5 pg (ref 26.0–34.0)
MCHC: 30.4 g/dL (ref 30.0–36.0)
MCHC: 30.6 g/dL (ref 30.0–36.0)
MCHC: 30.4 g/dL (ref 30.0–36.0)
MCV: 97.4 fL (ref 78.0–100.0)
MCV: 97.4 fL (ref 78.0–100.0)
MCV: 97.2 fL (ref 78.0–100.0)
PLATELETS: 368 10*3/uL (ref 150–400)
Platelets: 306 10*3/uL (ref 150–400)
Platelets: 377 10*3/uL (ref 150–400)
RBC: 2.67 MIL/uL — ABNORMAL LOW (ref 4.22–5.81)
RBC: 3.05 MIL/uL — ABNORMAL LOW (ref 4.22–5.81)
RBC: 3.22 MIL/uL — ABNORMAL LOW (ref 4.22–5.81)
RDW: 18.5 % — ABNORMAL HIGH (ref 11.5–15.5)
RDW: 18.4 % — AB (ref 11.5–15.5)
RDW: 18.3 % — ABNORMAL HIGH (ref 11.5–15.5)
WBC: 6.8 10*3/uL (ref 4.0–10.5)
WBC: 9.3 10*3/uL (ref 4.0–10.5)
WBC: 10.6 10*3/uL — ABNORMAL HIGH (ref 4.0–10.5)

## 2014-10-26 LAB — BASIC METABOLIC PANEL
Anion gap: 8 (ref 5–15)
BUN: 11 mg/dL (ref 6–23)
CO2: 25 mmol/L (ref 19–32)
Calcium: 8.1 mg/dL — ABNORMAL LOW (ref 8.4–10.5)
Chloride: 106 mmol/L (ref 96–112)
Creatinine, Ser: 0.79 mg/dL (ref 0.50–1.35)
GFR calc Af Amer: >90 mL/min (ref >90)
GFR calc non Af Amer: >90 mL/min (ref >90)
Glucose, Bld: 118 mg/dL — ABNORMAL HIGH (ref 70–99)
Potassium: 4.2 mmol/L (ref 3.5–5.1)
Sodium: 139 mmol/L (ref 135–145)

## 2014-10-26 LAB — PROTIME-INR
INR: 1.22 (ref 0.00–1.49)
PROTHROMBIN TIME: 15.6 s — AB (ref 11.6–15.2)

## 2014-10-26 LAB — HEPARIN LEVEL (UNFRACTIONATED)
Heparin Unfractionated: 0.27 IU/mL — ABNORMAL LOW (ref 0.30–0.70)
Heparin Unfractionated: 0.43 IU/mL (ref 0.30–0.70)
Heparin Unfractionated: 0.59 IU/mL (ref 0.30–0.70)

## 2014-10-26 LAB — GLUCOSE, CAPILLARY
Glucose-Capillary: 109 mg/dL — ABNORMAL HIGH (ref 70–99)
Glucose-Capillary: 86 mg/dL (ref 70–99)
Glucose-Capillary: 113 mg/dL — ABNORMAL HIGH (ref 70–99)
Glucose-Capillary: 89 mg/dL (ref 70–99)

## 2014-10-26 LAB — LACTATE DEHYDROGENASE: LDH: 425 U/L — ABNORMAL HIGH (ref 94–250)

## 2014-10-26 MED ORDER — FUROSEMIDE 10 MG/ML IJ SOLN
20.0000 mg | Freq: Once | INTRAMUSCULAR | Status: AC
  Administered 2014-10-26: 20 mg via INTRAVENOUS
  Filled 2014-10-26: qty 2

## 2014-10-26 MED ORDER — WARFARIN SODIUM 6 MG PO TABS
6.0000 mg | ORAL_TABLET | Freq: Once | ORAL | Status: AC
  Administered 2014-10-26: 6 mg via ORAL
  Filled 2014-10-26: qty 1

## 2014-10-26 MED ORDER — HEPARIN (PORCINE) IN NACL 100-0.45 UNIT/ML-% IJ SOLN
1150.0000 [IU]/h | INTRAMUSCULAR | Status: DC
  Administered 2014-10-27: 1250 [IU]/h via INTRAVENOUS
  Administered 2014-10-27: 1200 [IU]/h via INTRAVENOUS
  Administered 2014-10-28: 1150 [IU]/h via INTRAVENOUS
  Filled 2014-10-26 (×5): qty 250

## 2014-10-26 MED ORDER — FUROSEMIDE 10 MG/ML IJ SOLN: 20.0000 mg | Freq: Once | INTRAMUSCULAR | Status: DC

## 2014-10-26 NOTE — Progress Notes (Addendum)
Physical Therapy Treatment Patient Details Name: Scott [name] MRN: [id number] DOB: 09-14-1945 Today's Date: 10/26/2014    History of Present Illness 69 y.o. M with extensive cardiac hx as well as recent PE / DVT, admitted for SOB due to heart failure, cardiogenic, shock, PE. Evaluated by VAD team. Found to have mod - large R pleural effusion. LVAD implantation 09/22/14. Progressed well until developed hemothorax and required return to OR 4/5 and 4/6 for Redo sternotomy, reexploration of chest and removal of hematoma.    PT Comments    Pt rather fatigued looking today.  Initially PI's up around 6.  Initially pt appeared tired, but moving well.  When stats checked upon continuation of ambulation, PI's were trending down through low 2's to 1.9-2.1.  Pt was returned to room and recliner.  PI's quickly rebounded to 3.9-4.1, then within a minute through 5's to 6.0.  Pt feeling better through this period of time.  Power trending up into the 6's during this time also.   Follow Up Recommendations  Home health PT;Supervision/Assistance - 24 hour     Equipment Recommendations  Other (comment)    Recommendations for Other Services       Precautions / Restrictions Precautions Precautions: Sternal;Fall Precaution Comments: LVAD Restrictions Other Position/Activity Restrictions: sternal--pt needs VCs    Mobility  Bed Mobility               General bed mobility comments: up in the chair  Transfers Overall transfer level: Needs assistance Equipment used: Rolling walker (2 wheeled)   Sit to Stand: Min assist         General transfer comment: reinforced hand in lap: needed min assist from w/c  Ambulation/Gait Ambulation/Gait assistance: Min guard Ambulation Distance (Feet): 90 Feet (70 feet after rest.) Assistive device: Rolling walker (2 wheeled) Gait Pattern/deviations: Step-through pattern Gait velocity: slower Gait velocity interpretation: Below normal speed for  age/gender General Gait Details: moderate use of the RW.  Generally flexed posture.  Worn out looking,.   Stairs            Wheelchair Mobility    Modified Rankin (Stroke Patients Only)       Balance             Standing balance-Leahy Scale: Fair                      Cognition Arousal/Alertness: Awake/alert Behavior During Therapy: WFL for tasks assessed/performed Overall Cognitive Status: Within Functional Limits for tasks assessed                      Exercises      General Comments General comments (skin integrity, edema, etc.): Originally PI around 6.0.  As pt show signs of extreme fatigue looked at PI and it had dropped to 1.9-2.3.  Got pt to room and PI quickly moderated to 3.9-4,1 and withing a minute was trendiing in the 5''s to 6.1.  Power trending up, but just in the 6's.      Pertinent Vitals/Pain Pain Assessment: No/denies pain    Home Living                      Prior Function            PT Goals (current goals can now be found in the care plan section) Acute Rehab PT Goals Patient Stated Goal: to go home  Time For Goal Achievement: 11/01/14 (will continue goals  for 1 more week and reevaluate) Potential to Achieve Goals: Good Progress towards PT goals: Not progressing toward goals - comment (not feeling well today; PI's down)    Frequency  Min 3X/week    PT Plan Current plan remains appropriate    Co-evaluation             End of Session   Activity Tolerance: Patient limited by fatigue;Other (comment) (falling PI's) Patient left: in chair;with call bell/phone within reach;with family/visitor present     Time: 1045-1110 PT Time Calculation (min) (ACUTE ONLY): 25 min  Charges:  $Gait Training: 8-22 mins $Therapeutic Activity: 8-22 mins                    G Codes:      Jashawna Reever, [name] 10/26/2014, 11:56 AM 10/26/2014  Donnella Sham, PT [phone]  (pager)

## 2014-10-26 NOTE — Progress Notes (Signed)
Patient ID: Scott [name], male   DOB: [birth year], 69 y.o.   MRN: [id number] HeartMate 2 Rounding Note  Subjective:    S/P HMII LVAD on 09/20/2013.   Moved to ICU  4/5 due to low MAP and low hemoglobin. Taken Back to the OR 4/5 and 4/6  for recurrent bleed and evacuation of clots from left chest.   Ramp echo 4/22 and speed increased to 9800  Yesterday PI was low. Got 1.5L of IVF. Weight up 5 pounds. Feeling better. Now on heparin for low INR.  Off abx. WBC 17.5 -> 18.0 -> 12.5>8 -> 6.8 INR 1.6 -> 1.35> 1.25 -> 1.22 LDH 437 Hgb 8.6 -> 7.9  LVAD INTERROGATION:  HeartMate II LVAD:  Flow 5.0  liters/min, speed 9600, power 6.0  PI 6.7  Had ~25 PI events overnight. Only 3 this am  Objective:    Vital Signs:   Temp:  [97.9 F (36.6 C)-98.5 F (36.9 C)] 97.9 F (36.6 C) (04/26 0454) Pulse Rate:  [52-90] 52 (04/26 0454) Resp:  [16-18] 16 (04/25 2001) SpO2:  [90 %-96 %] 90 % (04/26 0454) Weight:  [78.5 kg (173 lb 1 oz)] 78.5 kg (173 lb 1 oz) (04/26 0454) Last BM Date: 10/25/14 Mean arterial Pressure  80-102 Intake/Output:   Intake/Output Summary (Last 24 hours) at 10/26/14 0927 Last data filed at 10/26/14 0900  Gross per 24 hour  Intake    123 ml  Output   1425 ml  Net  -1302 ml     Physical Exam:  General: Sitting in chair  HEENT: normal.  Neck: supple. Carotids 2+ bilat; no bruits. No lymphadenopathy or thryomegaly appreciated.  Cor: Mechanical heart sounds with LVAD hum present. Lungs: RML RLL LLL crackles. On room air.   Abdomen: soft, nontender, nondistended. No hepatosplenomegaly. No bruits or masses. Good bowel sounds. Driveline: Dressing looks good.  Extremities: no cyanosis, clubbing, rash.  No edema RUE PICC Neuro: Awake alert   Telemetry: NSR 50-60s    Labs: Basic Metabolic Panel:  Recent Labs Lab 10/21/14 0450 10/22/14 0505 10/24/14 0603 10/25/14 0615  NA 143 142 142 142  K 3.3* 3.6 3.8 3.2*  CL 104 105 105 105  CO2 31 29 31 31   GLUCOSE 101*  159* 103* 116*  BUN 12 13 11 15   CREATININE 0.77 0.84 0.62 0.68  CALCIUM 8.3* 8.3* 8.3* 8.1*  MG  --  1.9 2.0  --     Liver Function Tests: No results for input(s): AST, ALT, ALKPHOS, BILITOT, PROT, ALBUMIN in the last 168 hours. No results for input(s): LIPASE, AMYLASE in the last 168 hours. No results for input(s): AMMONIA in the last 168 hours.  CBC:  Recent Labs Lab 10/22/14 0505 10/23/14 0505 10/24/14 0603 10/25/14 0615 10/26/14 0052  WBC 17.5* 18.0* 12.1* 8.0 6.8  HGB 8.7* 9.1* 8.6* 8.6* 7.9*  HCT 28.6* 29.2* 28.3* 28.2* 26.0*  MCV 99.0 97.0 99.0 98.6 97.4  PLT 328 371 331 320 306    INR:  Recent Labs Lab 10/22/14 0505 10/23/14 0505 10/24/14 0603 10/25/14 0615 10/26/14 0052  INR 1.65* 1.41 1.35 1.24 1.22    Other results:    Imaging: No results found.   Medications:     Scheduled Medications: . antiseptic oral rinse  7 mL Mouth Rinse q12n4p  . budesonide-formoterol  2 puff Inhalation BID  . carvedilol  3.125 mg Oral BID WC  . chlorhexidine  15 mL Mouth Rinse BID  . citalopram  20 mg Oral  Daily  . dextromethorphan  30 mg Oral BID  . guaiFENesin  600 mg Oral BID  . pantoprazole  40 mg Oral QHS  . potassium chloride  40 mEq Oral BID  . sodium chloride  10 mL Intravenous Q12H  . spironolactone  12.5 mg Oral Daily  . traZODone  50 mg Oral QHS  . Warfarin - Physician Dosing Inpatient   Does not apply q1800    Infusions: . heparin 1,300 Units/hr (10/26/14 0800)  . lactated ringers 20 mL/hr at 10/23/14 2000    PRN Medications: acetaminophen, fentaNYL (SUBLIMAZE) injection, hydrALAZINE, levalbuterol, ondansetron (ZOFRAN) IV, RESOURCE THICKENUP CLEAR, sodium chloride, traMADol   Assessment:    1.  A/C systolic HF class IV with cardiogenic shock- EF 15% with moderate RV dysfunction 3/16 echo.         -S/P HMII LVAD 09/21/14  2. CAD s/p Anterior STEMI on 06/12/14 with stenting of LAD-off Plavix.  3. Acute on chronic hypercarbic respiratory  failure  4. 06/12/14 VT in setting of STEMI 5. Ankylosing spondylitis. 6. Bilateral PE with R DVT- S/P IVC filter 08/27/2014--->coumadin 7. Acute Blood Loss into pleural space:  Back to OR 4/5 and 4/6 for recurrent bleed. Received multiple blood products.  8. PAF: On amiodarone.  9. Nose Bleed.-  10. Hypokalemia  Plan/Discussion:    S/P 09/21/2014 HMII LVAD.   Back to OR 4/5 and and again 4/6 for recurrent bleed and evacuation clots. Received multiple blood products.   Multiple PI events yesterday due to hypovolemia. Now improved with fluid resuscitation. Feeling better. VAD parameters much improved. Hemoglobin down slightly but may be dilutional. Will recheck CBC this afternoon as he is on heparin and coumadin. Will let MAP drift up slightly. With bradycardia will hold carvedilol. Supp K+.   Encouraged careful ambulation.     Continue VAD teaching and ambulation. Home Health Orders placed for d/c . Will need to set up BIPAP. Care management consulted.   I reviewed the LVAD parameters from today, and compared the results to the patient's prior recorded data.  No programming changes were made.  The LVAD is functioning within specified parameters.  The patient performs LVAD self-test daily.  LVAD interrogation was negative for any significant power changes, alarms or PI events/speed drops.  LVAD equipment check completed and is in good working order.  Back-up equipment present.   LVAD education done on emergency procedures and precautions and reviewed exit site care.   Tyshaun Alegandro Macnaughton,MD 9:27 AM Length of Stay: 22 VAD Team --- VAD ISSUES ONLY--- Pager [phone] (7am - 7am) Advanced Heart Failure Team  Pager [phone] (M-F; 7a - 4p)  Please contact [facility] for night-coverage after hours (4p -7a ) and weekends on [email or web address]

## 2014-10-26 NOTE — Progress Notes (Signed)
Admitted 09/10/14 with worsening heart failure and cardiogenic shock.  HeartMate II LVAD implated on 09/21/14 by Dr. Darcey Nora as DT VAD.   Vital signs: Temp: 98.6 deg F HR:  52 - 72 SR Doppler MAP:  80 - 105 O2 Sat:  90 - 94 % on room air Wt in lbs:  173 > 173 > 177>172>170>169>168>173  Telemetry:  Sinus brady - sinus rhythm   LVAD interrogation reveals:  Speed:  9600 Flow: 6.0 Power:  6.5 PI: 4.8 Alarms: none Events: 3 today; 20 yesterday Fixed speed:  9600 Low speed limit: 9000  Blood products: OR:   09/21/14:  1 unit PC, 2 plts, 3 FFP 10/05/14:  3 PC's, 500 cell saver 10/06/14:  2 PCs, 2 cryo, 300 cell saver  ICU:  09/22/14:   1 unit PC 10/05/14:  2 PCs, 2 FFP 10/06/14:  5 PCs, 2 FFP, 1 Plt, 1 cryo, 3 Factor VII, DDAVP  Nitric Oxide: off  10/11/14  ASA 81 mg:  Last dose (81 mg) 10/11/14  Warfarin:  INR goal 1.5 - 2.0  Gtts:  Heparin (started 10/26/14)  Labs:  LDH trend: 301>332>340>394>398>420>414>409>448>408>469>459>437>425  INR trend: 1.72>1.81>1.87>1.77>1.65>1.41>1.35>1.24>1.22  WBC:  20.6>22.9>21.0>21.6>18.1>17.5>18.0>12.1>8.0>6.8  Hgb: 8.8>9.1>9.3>9.3>9.0>8.9>8.8>8.7>9.1>8.6>8.6>7.9>9.1   LVAD Exit Site:  VAD dressing removed and site care performed using sterile technique. Drive line exit site cleaned with Chlora prep applicators x 2, allowed to dry, and gauze dressing with aquacel strip re-applied. Exit site with some tissue ingrowth noted. Less eschar/hyperkeritanized crust tissue around drive line, seems to be diminishing with aggressive cleansing of site; wife at bedside and observed cleaning technique with stated understanding. Erythema resolving, no drainage or foul odor noted.     Plan/Recommendations and Discharge Needs as discussed with VAD team:  1. INR remains subtherapeutic, continue heparin gtt. 2. Drop in PI last night with 500 cc bolus per Dr. Haroldine Laws. 3. Drop in PI this am 1.9 - 2.1 while walking with PT; improved with afternoon walk with PI  drop to 3.8. O2 sats 92 - 93. 4. Pt c/o dizziness with standing/walking; orthostatic MAPs obtained by nurses with following results:  Lying:  92   Sitting:  35 (PI 5.8)   Standing:  60 (PI 4.4) 5. Pt c/o SOB while wearing mask during dressing change; O2 sat checked afterwards while pt lying in bed - 92% at rest on room air. 6. Pt has more frequent, congested cough during dressing change; states he is coughing up "white frothy" sputum. 7. Discussed above with team, repeat CBC and 2 view CXR ordered.

## 2014-10-26 NOTE — Progress Notes (Signed)
10/26/2014. 9:18 AM Confirmed with Scott [name], speech therapist that pt. Has been cleared by speech therapy and heart healthy diet with thin liquids is appropriate for patient. Verbal instruction ok to take down dysphagia 2 nectar liquid yellow sign above bed. Pt.updated on plan of care. Will continue to monitor patient.  Lanorris Kalisz, Arville Lime

## 2014-10-26 NOTE — Progress Notes (Addendum)
VAD on call provider [facility] paged due to patient's PI sustaining 1.9-2.5, MAP 80.  Pt's PI baseline today around 6.  She requested to notify Dr. Tempie Hoist - ordered to give 500 cc bolus.  Bolus given.  Pt current PI 5.8, MAP 94.  Will continue to monitor patient closely.  Claudette Stapler, RN

## 2014-10-26 NOTE — Progress Notes (Addendum)
10/26/2014 3:09 PM Nursing note Contacted Zada Girt NP in regards to concerns regarding pt. With continued ambulation intolerance, decreased PI with ambulation, as well as lower o2 saturation trend and intermittent SOB. Molly to discuss with MD and call back with orders if indicated. Pt. Updated on plan of care. Will continue to closely monitor patient.  Received call back. MD to order CXR. Verbal order ok to place re-eval order speech therapy to re-look at swallowing after trouble swallowing pills this am with cough.  Heavan Francom, Arville Lime

## 2014-10-26 NOTE — Progress Notes (Signed)
Utilization review completed.  

## 2014-10-26 NOTE — Progress Notes (Signed)
10/26/2014 11:43 AM Nursing note Zada Girt NP contacted and made aware of decreased PI and increased power needs during ambulation with PT as well as pt. Symptoms. Orthostatic VIS collected per orders. Will continue to closely monitor patient.  Jaremy Nosal, Arville Lime

## 2014-10-26 NOTE — Progress Notes (Signed)
ANTICOAGULATION CONSULT NOTE - Follow Up Consult  Pharmacy Consult for heparin Indication: LVAD and h/o PE   Labs:  Recent Labs  10/24/14 0603 10/25/14 0615 10/25/14 1547 10/26/14 0052  HGB 8.6* 8.6*  --  7.9*  HCT 28.3* 28.2*  --  26.0*  PLT 331 320  --  306  LABPROT 16.8* 15.7*  --  15.6*  INR 1.35 1.24  --  1.22  HEPARINUNFRC  --   --  0.16* 0.27*  CREATININE 0.62 0.68  --   --      Assessment: 69yo male remains subtherapeutic on heparin after rate increase though approaching goal.  Goal of Therapy:  Heparin level 0.3-0.7 units/ml   Plan:  Will increase heparin gtt slightly to 1300 units/hr and check level in 6hr.  Wynona Neat, PharmD, BCPS  10/26/2014,1:36 AM

## 2014-10-26 NOTE — Progress Notes (Signed)
CARDIAC REHAB PHASE I   PRE:  Rate/Rhythm: 77 SR    BP: sitting 84    SaO2: 92-93 RA  MODE:  Ambulation: 150 ft   POST:  Rate/Rhythm: 80 SR    BP: sitting 78     SaO2: 91 RA  Pt sts he feels more tired today than yesterday but willing to walk. Monitored PI during walk. Slowly down to 3.8 then began to come back up. Pt sat to rest at 4.4. SaO2 at sitting rest was 88. Up to 91 with rest. Pt more congested today, coughed with rattling every couple of minutes. PI up to 6.0 and pt walked rest of way back to room. PI down to 4.0 toward end of walk. To recliner. SaO2 91 after walking. BP lower after walk, 78 MAP. Wife took care of batteries. Pt denies dizziness, just tired and weak in general. Pt using arms to support himself more while walking.  [id number]   Josephina Shih Wheatcroft CES, ACSM 10/26/2014 2:54 PM

## 2014-10-26 NOTE — Progress Notes (Addendum)
ANTICOAGULATION CONSULT NOTE - Follow Up Consult  Pharmacy Consult for Heparin Indication: LVAD/Hx of PE  No Known Allergies  Patient Measurements: Height: 5\' 8"  (172.7 cm) Weight: 173 lb 1 oz (78.5 kg) IBW/kg (Calculated) : 68.4 Heparin Dosing Weight: 78.5kg  Vital Signs: Temp: 97.9 F (36.6 C) (04/26 0454) Temp Source: Oral (04/26 0454) Pulse Rate: 72 (04/26 1145)  Labs:  Recent Labs  10/24/14 0603 10/25/14 0615 10/25/14 1547 10/26/14 0052 10/26/14 1005  HGB 8.6* 8.6*  --  7.9*  --   HCT 28.3* 28.2*  --  26.0*  --   PLT 331 320  --  306  --   LABPROT 16.8* 15.7*  --  15.6*  --   INR 1.35 1.24  --  1.22  --   HEPARINUNFRC  --   --  0.16* 0.27* 0.43  CREATININE 0.62 0.68  --   --   --     Estimated Creatinine Clearance: 87.9 mL/min (by C-G formula based on Cr of 0.68).   Medications:  Infusions:  . heparin 1,300 Units/hr (10/26/14 0800)  . lactated ringers 20 mL/hr at 10/23/14 2000    Assessment: 70yoM s/p LVAD on 3/22. Pt w/ history of PE on chronic warfarin PTA. MD dosing warfarin, INR today 1.22 (goal 1.5-2 d/t recent bleed). Pharmacy consulted to manage heparin with low goal of 0.3-0.5 and max rate of 1500 units/hr. HL this AM therapeutic at 0.43. Hgb 7.9, plts 306 - down from yesterday, most likely dilutional. No bleeding noted.   Goal of Therapy:  Heparin level 0.3-0.5 units/ml INR goal 1.5-2  Monitor platelets by anticoagulation protocol: Yes   Plan:  Continue heparin at 1300 units/hr Confirmatory 6hr HL at 1600 Daily HL/CBC Monitor s/sx of bleeding Warfarin dose per MD  Thank you for allowing pharmacy to be part of this patient's care team  [place], Pharm.D Clinical Pharmacy Resident Pager: [phone] 10/26/2014 .2:20 PM   INR trending down continues on heparin at goal at low end. Will give 6mg  of warfarin tonight. Warfarin requirements seem to be increasing as he has not required high doses in the past. Will continue to watch  closely.  Erin Hearing PharmD., BCPS Clinical Pharmacist Pager [phone] 10/26/2014 2:32 PM    ===========================   Addendum: - repeat HL slightly elevated at 0.59 - no bleeding reported    Plan: - decrease heparin gtt to 1250 units/hr - f/u AM labs    Naiyana Barbian D. Mina Marble, PharmD, BCPS Pager:  [phone] 10/26/2014, 8:33 PM

## 2014-10-26 NOTE — Progress Notes (Signed)
CSW met with patient and wife in hospital room. Patient states he is feeling better today than yesterday but still not himself yet. He ambulated with PT today and reports he had a low PI event. Patient and wife spoke of lengthy hospitalization but glad they are here during these events. Patient stated he is "not ready to go home yet". CSW will continue to follow for support throughout recovery. Raquel Sarna, [place]

## 2014-10-27 LAB — GLUCOSE, CAPILLARY
GLUCOSE-CAPILLARY: 120 mg/dL — AB (ref 70–99)
Glucose-Capillary: 96 mg/dL (ref 70–99)
Glucose-Capillary: 103 mg/dL — ABNORMAL HIGH (ref 70–99)

## 2014-10-27 LAB — CBC
HCT: 29.9 % — ABNORMAL LOW (ref 39.0–52.0)
Hemoglobin: 9.2 g/dL — ABNORMAL LOW (ref 13.0–17.0)
MCH: 29.8 pg (ref 26.0–34.0)
MCHC: 30.8 g/dL (ref 30.0–36.0)
MCV: 96.8 fL (ref 78.0–100.0)
Platelets: 338 10*3/uL (ref 150–400)
RBC: 3.09 MIL/uL — ABNORMAL LOW (ref 4.22–5.81)
RDW: 18.2 % — ABNORMAL HIGH (ref 11.5–15.5)
WBC: 9.7 10*3/uL (ref 4.0–10.5)

## 2014-10-27 LAB — HEPARIN LEVEL (UNFRACTIONATED)
Heparin Unfractionated: 0.53 IU/mL (ref 0.30–0.70)
Heparin Unfractionated: 0.61 IU/mL (ref 0.30–0.70)

## 2014-10-27 LAB — BASIC METABOLIC PANEL
Anion gap: 7 (ref 5–15)
BUN: 11 mg/dL (ref 6–23)
CO2: 25 mmol/L (ref 19–32)
Calcium: 8 mg/dL — ABNORMAL LOW (ref 8.4–10.5)
Chloride: 106 mmol/L (ref 96–112)
Creatinine, Ser: 0.63 mg/dL (ref 0.50–1.35)
GFR calc Af Amer: >90 mL/min (ref >90)
GFR calc non Af Amer: >90 mL/min (ref >90)
Glucose, Bld: 98 mg/dL (ref 70–99)
Potassium: 4 mmol/L (ref 3.5–5.1)
Sodium: 138 mmol/L (ref 135–145)

## 2014-10-27 LAB — PROTIME-INR
INR: 1.21 (ref 0.00–1.49)
PROTHROMBIN TIME: 15.4 s — AB (ref 11.6–15.2)

## 2014-10-27 LAB — LACTATE DEHYDROGENASE: LDH: 470 U/L — AB (ref 94–250)

## 2014-10-27 MED ORDER — FUROSEMIDE 20 MG PO TABS
20.0000 mg | ORAL_TABLET | ORAL | Status: DC
  Administered 2014-10-28 – 2014-11-01 (×3): 20 mg via ORAL
  Filled 2014-10-27 (×5): qty 1

## 2014-10-27 MED ORDER — WARFARIN SODIUM 7.5 MG PO TABS
7.5000 mg | ORAL_TABLET | Freq: Once | ORAL | Status: AC
  Administered 2014-10-27: 7.5 mg via ORAL
  Filled 2014-10-27: qty 1

## 2014-10-27 NOTE — Progress Notes (Signed)
Patient ID: Scott [name], male   DOB: 18-Sep-1945, 69 y.o.   MRN: [id number] HeartMate 2 Rounding Note  Subjective:    S/P HMII LVAD on 09/20/2013.   Moved to ICU  4/5 due to low MAP and low hemoglobin. Taken Back to the OR 4/5 and 4/6  for recurrent bleed and evacuation of clots from left chest.   Ramp echo 4/22 and speed increased to 9800  Yesterday PI was low during ambulation. Increased dyspnea. CXR ? Pneumonia LLL RML. Given 20 mg IV lasix. Diuresed 2.1 liters. Now on heparin for low INR. Feeling better.   Off abx. WBC 9.7 INR 1.21 LDH 437>470 Hgb 9.2  LVAD INTERROGATION:  HeartMate II LVAD:  Flow 5.0  liters/min, speed 9600, power 6.0  PI 6.7  Less than 10  PI events overnight.   Objective:    Vital Signs:   Temp:  [97.6 F (36.4 C)-98.6 F (37 C)] 97.6 F (36.4 C) (04/27 0627) Pulse Rate:  [72-91] 91 (04/27 0627) Resp:  [18] 18 (04/27 0627) SpO2:  [90 %-94 %] 90 % (04/27 0627) Weight:  [171 lb 3.2 oz (77.656 kg)] 171 lb 3.2 oz (77.656 kg) (04/27 0627) Last BM Date: 10/26/14 Mean arterial Pressure  80-102 Intake/Output:   Intake/Output Summary (Last 24 hours) at 10/27/14 0757 Last data filed at 10/27/14 0324  Gross per 24 hour  Intake    370 ml  Output   2445 ml  Net  -2075 ml     Physical Exam:  General: Sitting on the side of bed.   HEENT: normal.  Neck: supple. Carotids 2+ bilat; no bruits. No lymphadenopathy or thryomegaly appreciated.  Cor: Mechanical heart sounds with LVAD hum present. Lungs: RML RLL LLL crackles. On room air.   Abdomen: soft, nontender, nondistended. No hepatosplenomegaly. No bruits or masses. Good bowel sounds. Driveline: Dressing looks good.  Extremities: no cyanosis, clubbing, rash.  No edema RUE PICC Neuro: Awake alert   Telemetry: NSR 50-60s    Labs: Basic Metabolic Panel:  Recent Labs Lab 10/22/14 0505 10/24/14 0603 10/25/14 0615 10/26/14 1605 10/27/14 0512  NA 142 142 142 139 138  K 3.6 3.8 3.2* 4.2 4.0  CL  105 105 105 106 106  CO2 29 31 31 25 25   GLUCOSE 159* 103* 116* 118* 98  BUN 13 11 15 11 11   CREATININE 0.84 0.62 0.68 0.79 0.63  CALCIUM 8.3* 8.3* 8.1* 8.1* 8.0*  MG 1.9 2.0  --   --   --     Liver Function Tests: No results for input(s): AST, ALT, ALKPHOS, BILITOT, PROT, ALBUMIN in the last 168 hours. No results for input(s): LIPASE, AMYLASE in the last 168 hours. No results for input(s): AMMONIA in the last 168 hours.  CBC:  Recent Labs Lab 10/25/14 0615 10/26/14 0052 10/26/14 1410 10/26/14 1605 10/27/14 0512  WBC 8.0 6.8 9.3 10.6* 9.7  HGB 8.6* 7.9* 9.1* 9.5* 9.2*  HCT 28.2* 26.0* 29.7* 31.3* 29.9*  MCV 98.6 97.4 97.4 97.2 96.8  PLT 320 306 368 377 338    INR:  Recent Labs Lab 10/23/14 0505 10/24/14 0603 10/25/14 0615 10/26/14 0052 10/27/14 0512  INR 1.41 1.35 1.24 1.22 1.21    Other results:    Imaging: Dg Chest 2 View  10/26/2014   CLINICAL DATA:  Shortness of breath, CHF and left ventricular assist device  EXAM: CHEST  2 VIEW  COMPARISON:  PA and lateral chest of October 24, 2014  FINDINGS: The lungs are  adequately inflated. The interstitial markings remain increased. Confluent patchy densities in the right lower lung persist. There is increasing density in the left retrocardiac region. There are small bilateral pleural effusions The cardiac silhouette remains enlarged. The left ventricular assist device is partially visible. The PICC line tip projects over the distal portion of the SVC. There are 8 intact sternal wires. There is stable left perihilar subsegmental atelectasis.  IMPRESSION: 1. Increased in atelectasis or pneumonia in the left lower lobe with fairly stable similar finding in the right middle lobe. 2. No evidence of pulmonary edema. The heart and visualized portions of the left ventricular assist device are unchanged.   Electronically Signed   By: David  Martinique M.D.   On: 10/26/2014 17:18     Medications:     Scheduled Medications: .  antiseptic oral rinse  7 mL Mouth Rinse q12n4p  . budesonide-formoterol  2 puff Inhalation BID  . chlorhexidine  15 mL Mouth Rinse BID  . citalopram  20 mg Oral Daily  . dextromethorphan  30 mg Oral BID  . guaiFENesin  600 mg Oral BID  . pantoprazole  40 mg Oral QHS  . potassium chloride  40 mEq Oral BID  . sodium chloride  10 mL Intravenous Q12H  . traZODone  50 mg Oral QHS  . Warfarin - Physician Dosing Inpatient   Does not apply q1800    Infusions: . heparin 1,200 Units/hr (10/27/14 0739)  . lactated ringers 20 mL/hr at 10/23/14 2000    PRN Medications: acetaminophen, fentaNYL (SUBLIMAZE) injection, hydrALAZINE, levalbuterol, ondansetron (ZOFRAN) IV, RESOURCE THICKENUP CLEAR, sodium chloride, traMADol   Assessment:    1.  A/C systolic HF class IV with cardiogenic shock- EF 15% with moderate RV dysfunction 3/16 echo.         -S/P HMII LVAD 09/21/14  2. CAD s/p Anterior STEMI on 06/12/14 with stenting of LAD-off Plavix.  3. Acute on chronic hypercarbic respiratory failure  4. 06/12/14 VT in setting of STEMI 5. Ankylosing spondylitis. 6. Bilateral PE with R DVT- S/P IVC filter 08/27/2014--->coumadin 7. Acute Blood Loss into pleural space:  Back to OR 4/5 and 4/6 for recurrent bleed. Received multiple blood products.  8. PAF: On amiodarone.  9. Nose Bleed.-  10. Hypokalemia  Plan/Discussion:    S/P 09/21/2014 HMII LVAD.   Back to OR 4/5 and and again 4/6 for recurrent bleed and evacuation clots. Received multiple blood products.   Yesterday  CXR ? Pneumonia versus HF. Given 20 mg IV lasix. Weight down 2 pounds. Hold diuretics today.    Continue VAD teaching and ambulation. Home Health Orders placed for d/c . Will need to set up BIPAP. Care management consulted.   I reviewed the LVAD parameters from today, and compared the results to the patient's prior recorded data.  No programming changes were made.  The LVAD is functioning within specified parameters.  The patient  performs LVAD self-test daily.  LVAD interrogation was negative for any significant power changes, alarms or PI events/speed drops.  LVAD equipment check completed and is in good working order.  Back-up equipment present.   LVAD education done on emergency procedures and precautions and reviewed exit site care.   CLEGG,AMY NP-C  8:14 AM   Length of Stay: 46 VAD Team --- VAD ISSUES ONLY--- Pager [phone] (7am - 7am) Advanced Heart Failure Team  Pager [phone] (M-F; 7a - 4p)  Please contact [facility] for night-coverage after hours (4p -7a ) and weekends on [email or web address]  Patient  seen and examined with Darrick Grinder, NP. We discussed all aspects of the encounter. I agree with the assessment and plan as stated above.   Much better after diuresis. He has narrow euvolemic window. Will give lasix 20mg  every other day. INR still low. On heparin. Titrating coumadin. VAD parameters improved.   Jamine Bensimhon,MD 8:19 AM

## 2014-10-27 NOTE — Progress Notes (Signed)
ANTICOAGULATION CONSULT NOTE - Follow Up Consult  Pharmacy Consult for heparin Indication: LVAD and h/o PE  Labs:  Recent Labs  10/25/14 0615  10/26/14 0052 10/26/14 1005 10/26/14 1410 10/26/14 1605 10/26/14 1911 10/27/14 0512  HGB 8.6*  --  7.9*  --  9.1* 9.5*  --  9.2*  HCT 28.2*  --  26.0*  --  29.7* 31.3*  --  29.9*  PLT 320  --  306  --  368 377  --  338  LABPROT 15.7*  --  15.6*  --   --   --   --  15.4*  INR 1.24  --  1.22  --   --   --   --  1.21  HEPARINUNFRC  --   < > 0.27* 0.43  --   --  0.59 0.53  CREATININE 0.68  --   --   --   --  0.79  --  0.63  < > = values in this interval not displayed.   Assessment: 69yo male remains slightly supratherapeutic on heparin after rate decrease.  Goal of Therapy:  Heparin level 0.3-0.5 units/ml   Plan:  Will decrease heparin gtt slightly to 1200 units/hr and check level in 6hr.  Wynona Neat, PharmD, BCPS  10/27/2014,7:11 AM

## 2014-10-27 NOTE — Progress Notes (Signed)
ANTICOAGULATION CONSULT NOTE - Follow Up Consult  Pharmacy Consult for Heparin/warfarin Indication: LVAD/Hx of PE  No Known Allergies  Patient Measurements: Height: 5\' 8"  (172.7 cm) Weight: 171 lb 3.2 oz (77.656 kg) IBW/kg (Calculated) : 68.4 Heparin Dosing Weight: 78.5kg  Vital Signs: Temp: 97.6 F (36.4 C) (04/27 0627) Temp Source: Oral (04/27 0627) Pulse Rate: 91 (04/27 0627)  Labs:  Recent Labs  10/25/14 0615  10/26/14 0052 10/26/14 1005 10/26/14 1410 10/26/14 1605 10/26/14 1911 10/27/14 0512  HGB 8.6*  --  7.9*  --  9.1* 9.5*  --  9.2*  HCT 28.2*  --  26.0*  --  29.7* 31.3*  --  29.9*  PLT 320  --  306  --  368 377  --  338  LABPROT 15.7*  --  15.6*  --   --   --   --  15.4*  INR 1.24  --  1.22  --   --   --   --  1.21  HEPARINUNFRC  --   < > 0.27* 0.43  --   --  0.59 0.53  CREATININE 0.68  --   --   --   --  0.79  --  0.63  < > = values in this interval not displayed.  Estimated Creatinine Clearance: 87.9 mL/min (by C-G formula based on Cr of 0.63).   Medications:  Infusions:  . heparin 1,200 Units/hr (10/27/14 0739)  . lactated ringers 20 mL/hr at 10/23/14 2000    Assessment: 84 yoM s/p LVAD on 3/22. Pt w/ history of PE on chronic warfarin PTA.   INR today 1.2 (goal 1.5-2 d/t recent bleed). Pharmacy consulted to manage heparin with low goal of 0.3-0.5 and max rate of 1500 units/hr. HL this AM therapeutic at 0.53. Hgb 9, plts 338, no bleeding noted.   Unsure why INR not responding, patient has not had these requirements during this admission.  Goal of Therapy:  Heparin level 0.3-0.5 units/ml INR goal 1.5-2  Monitor platelets by anticoagulation protocol: Yes   Plan:  Continue heparin at 1200 units/hr Confirmatory 6hr HL at 1400 Daily HL/CBC/INR Monitor s/sx of bleeding Warfarin 7.5mg  tonight  Thank you for allowing pharmacy to be part of this patient's care team  Erin Hearing PharmD., BCPS Clinical Pharmacist Pager [phone] 10/27/2014 1:32  PM

## 2014-10-27 NOTE — Evaluation (Signed)
Clinical/Bedside Swallow Evaluation Patient Details  Name: Scott [name] MRN: [id number] Date of Birth: 12/15/1945  Today's Date: 10/27/2014 Time: SLP Start Time (ACUTE ONLY): 1214 SLP Stop Time (ACUTE ONLY): 1231 SLP Time Calculation (min) (ACUTE ONLY): 17 min  Past Medical History:  Past Medical History  Diagnosis Date  . Hyperlipidemia   . Hypertension   . Diabetes   . Nephrolithiasis   . Ankylosing spondylitis   . CAD (coronary artery disease)     a. stenting of LCx 2013; b. STEMI 06/12/14 s/p PCI to LAD complicated by post cath shock requiring IABP; VT s/p DCCV, EF 20%; c. NSTEMI 06/26/14 treated medically.  . Ischemic cardiomyopathy     a. echo 08/23/2014 EF <20%, dilated CM, mod MR/TR  . Acute on chronic respiratory failure     a. 08/2014 in setting of PE.  . Bilateral pulmonary embolism     a. 08/2014 - started on Coumadin. Retrievable IVC filter placed 08/27/14 due to RV strain and large clot burden.  . Right leg DVT     a. 08/2014.  Marland Kitchen Chronic systolic CHF (congestive heart failure)   . Hypotension   . Hemoptysis     a. 08/2014 possibly due to pulm infarct/PE.  Marland Kitchen Pleural effusion on right 08/2014 - small  . Leukocytosis   . Carotid artery disease     a. s/p stenting.  . Ventricular tachycardia     a. 06/2014 at time of MI, s/p DCCV.  Marland Kitchen Reactive thrombocytosis 09/10/2014  . Leukocytosis 09/10/2014   Past Surgical History:  Past Surgical History  Procedure Laterality Date  . Left heart catheterization with coronary angiogram N/A 06/12/2014    Procedure: LEFT HEART CATHETERIZATION WITH CORONARY ANGIOGRAM;  Surgeon: Lorretta Harp, MD;  Location: [facility] CATH LAB;  Service: Cardiovascular;  Laterality: N/A;  . Right heart catheterization N/A 09/10/2014    Procedure: RIGHT HEART CATH;  Surgeon: [name], MD;  Location: [facility] CATH LAB;  Service: Cardiovascular;  Laterality: N/A;  . Right heart catheterization N/A 09/17/2014    Procedure: RIGHT HEART CATH;  Surgeon:  [name], MD;  Location: [facility] CATH LAB;  Service: Cardiovascular;  Laterality: N/A;  . Insertion of implantable left ventricular assist device N/A 09/21/2014    Procedure: INSERTION OF IMPLANTABLE LEFT VENTRICULAR ASSIST DEVICE;  Surgeon: Ivin Poot, MD;  Location: [place];  Service: Open Heart Surgery;  Laterality: N/A;  CIRC ARREST  NITRIC OXIDE  . Tee without cardioversion N/A 09/21/2014    Procedure: TRANSESOPHAGEAL ECHOCARDIOGRAM (TEE);  Surgeon: Ivin Poot, MD;  Location: [place];  Service: Open Heart Surgery;  Laterality: N/A;  . Tee without cardioversion N/A 10/05/2014    Procedure: TRANSESOPHAGEAL ECHOCARDIOGRAM (TEE);  Surgeon: Ivin Poot, MD;  Location: [place];  Service: Open Heart Surgery;  Laterality: N/A;  . Bedside evacuation of hematoma  10/05/2014    Procedure: EVACUATION OF HEMATOMA;  Surgeon: Ivin Poot, MD;  Location: [place];  Service: Open Heart Surgery;;  . Video assisted thoracoscopy (vats)/thorocotomy Left 10/06/2014    Procedure: VIDEO ASSISTED THORACOSCOPY (VATS)/THOROCOTOMY;  Surgeon: Ivin Poot, MD;  Location: [place];  Service: Open Heart Surgery;  Laterality: Left;   HPI:  69 y.o. M with extensive cardiac hx as well as recent PE / DVT, admitted for SOB due to heart failure, cardiogenic, shock, PE. Evaluated by VAD team. Found to have mod - large R pleural effusion. LVAD implantation 09/22/14. Progressed well until developed hemothorax and required  return to OR 4/5 and 4/6 for Redo sternotomy, reexploration of chest and removal of hematoma.  Intially with dysphagia - followed by SLP services; had two FEES (4/12 and 4/18), the latter revealing improved laryngeal sensation; no aspiration, and diet was progressed to mechanical soft thin liquids.  Pt's diet was ultimately advanced to regular, thin liquids and he was D/Cd from SLP services on 4/21.  New orders received for repeat swallow evaluation due to respiratory status.     Assessment / Plan /  Recommendation Clinical Impression  Repeated bedside swallow eval completed today per new orders.  Assessment completed while pt eating lunch.  Demonstrates sufficient mastication, swift swallow response, and no overt s/s of aspiration.  Pt demonstrates good awareness - he is cognizant of precautions and eats slowly, small quantities.  Reviewed s/s of aspiration; pt/wife verbalized understanding. Pt denies having difficulty with swallowing and does not appear to have regressed; in fact, overall function appears improved since 10/21/14.  There are no clinical indicators that indicate worsening swallow function.  Recommend continuing regular diet with thin liquids; meds whole in puree if large.  No SLP f/u warranted - D/W RN and pt, wife.      Aspiration Risk  Mild    Diet Recommendation Regular;Thin liquid   Liquid Administration via: Cup;No straw Medication Administration: Whole meds with puree Supervision: Patient able to self feed Compensations: Slow rate;Small sips/bites Postural Changes and/or Swallow Maneuvers: Seated upright 90 degrees;Upright 30-60 min after meal    Other  Recommendations Oral Care Recommendations: Oral care BID             SLP Swallow Goals     Swallow Study Prior Functional Status       General HPI: 69 y.o. M with extensive cardiac hx as well as recent PE / DVT, admitted for SOB due to heart failure, cardiogenic, shock, PE. Evaluated by VAD team. Found to have mod - large R pleural effusion. LVAD implantation 09/22/14. Progressed well until developed hemothorax and required return to OR 4/5 and 4/6 for Redo sternotomy, reexploration of chest and removal of hematoma.  Intially with dysphagia - followed by SLP services; had two FEES (4/12 and 4/18), the latter revealing improved laryngeal sensation; no aspiration, and diet was progressed to mechanical soft thin liquids.  Pt's diet was ultimately advanced to regular, thin liquids and he was D/Cd from SLP services on  4/21.  New orders received for repeat swallow evaluation due to respiratory status.   Type of Study: Bedside swallow evaluation Previous Swallow Assessment: FEES 4/12 and 4/18 Diet Prior to this Study: Regular;Thin liquids Temperature Spikes Noted: No Respiratory Status: Room air History of Recent Intubation: Yes Length of Intubations (days): 6 days Date extubated: 10/10/14 Behavior/Cognition: Alert;Cooperative Oral Cavity - Dentition: Adequate natural dentition Self-Feeding Abilities: Able to feed self Patient Positioning: Upright in chair Baseline Vocal Quality: Clear Volitional Cough: Strong Volitional Swallow: Able to elicit    Oral/Motor/Sensory Function Overall Oral Motor/Sensory Function: Appears within functional limits for tasks assessed   Ice Chips Ice chips: Not tested   Thin Liquid Thin Liquid: Within functional limits Presentation: Cup;Self Fed    Nectar Thick Nectar Thick Liquid: Not tested   Honey Thick Honey Thick Liquid: Not tested   Puree Puree: Within functional limits Presentation: Self Fed;Spoon   Solid Scott [name], [place] CCC/SLP Pager [phone]     Solid: Within functional limits Presentation: Self Fed       Scott [name] 10/27/2014,12:43 PM

## 2014-10-27 NOTE — Progress Notes (Signed)
Patient ambulated 150 ft in the hall with moderate help from wife and front wheel walker. PI dropped initially when getting out of bed, but during ambulation, PI remained >5. Patient tolerated fair. Back to chair. Will continue to monitor.   Domingo Dimes RN

## 2014-10-27 NOTE — Progress Notes (Signed)
PT Cancellation Note  Patient Details Name: Scott [name] MRN: [id number] DOB: [birth year]   Cancelled Treatment:    Reason Eval/Treat Not Completed: Pt eating breakfast   Scott [name] 10/27/2014, 8:28 AM Pager [phone]

## 2014-10-27 NOTE — Progress Notes (Signed)
Patient ambulated 150 ft pushing a wheel chair with moderate assistance from wife. Patient Stopped to rest once, with PI 3.6. Denies dizziness or pain. States he is just worn out. PI 4.6 when placed back in the chair. Patient out self back to wall power.   Domingo Dimes RN

## 2014-10-27 NOTE — Progress Notes (Signed)
CARDIAC REHAB PHASE I   PRE:  Rate/Rhythm: 101 ST  BP:  Supine:   Sitting: 86 MAP  Standing:    SaO2: 94%RA  MODE:  Ambulation: 150 ft   POST:  Rate/Rhythm: 106 ST  BP:  Supine:   Sitting: 92 MAP  Standing:    SaO2: 94%RA 1310-1340 Pt ready to walk. Wife put to batteries. Pt walked 150 ft with rolling walker, stopping once to rest. PI at 3.6 when he stopped to rest. He denied dizziness, just tired. PI at 4.1 in room when he sat down. Pt put self back to power source. Tired after walk. To recliner.   Graylon Good, RN BSN  10/27/2014 1:33 PM

## 2014-10-28 LAB — BASIC METABOLIC PANEL
Anion gap: 8 (ref 5–15)
BUN: 7 mg/dL (ref 6–23)
CO2: 24 mmol/L (ref 19–32)
Calcium: 8.1 mg/dL — ABNORMAL LOW (ref 8.4–10.5)
Chloride: 107 mmol/L (ref 96–112)
Creatinine, Ser: 0.58 mg/dL (ref 0.50–1.35)
GFR calc Af Amer: >90 mL/min (ref >90)
GFR calc non Af Amer: >90 mL/min (ref >90)
Glucose, Bld: 93 mg/dL (ref 70–99)
Potassium: 4.4 mmol/L (ref 3.5–5.1)
Sodium: 139 mmol/L (ref 135–145)

## 2014-10-28 LAB — CBC
HCT: 29.8 % — ABNORMAL LOW (ref 39.0–52.0)
Hemoglobin: 9.1 g/dL — ABNORMAL LOW (ref 13.0–17.0)
MCH: 29.4 pg (ref 26.0–34.0)
MCHC: 30.5 g/dL (ref 30.0–36.0)
MCV: 96.1 fL (ref 78.0–100.0)
Platelets: 320 10*3/uL (ref 150–400)
RBC: 3.1 MIL/uL — ABNORMAL LOW (ref 4.22–5.81)
RDW: 18.1 % — ABNORMAL HIGH (ref 11.5–15.5)
WBC: 9.2 10*3/uL (ref 4.0–10.5)

## 2014-10-28 LAB — HEPARIN LEVEL (UNFRACTIONATED): Heparin Unfractionated: 0.52 IU/mL (ref 0.30–0.70)

## 2014-10-28 LAB — GLUCOSE, CAPILLARY
GLUCOSE-CAPILLARY: 116 mg/dL — AB (ref 70–99)
GLUCOSE-CAPILLARY: 103 mg/dL — AB (ref 70–99)
GLUCOSE-CAPILLARY: 103 mg/dL — AB (ref 70–99)
Glucose-Capillary: 156 mg/dL — ABNORMAL HIGH (ref 70–99)
Glucose-Capillary: 99 mg/dL (ref 70–99)

## 2014-10-28 LAB — PROTIME-INR
INR: 1.35 (ref 0.00–1.49)
Prothrombin Time: 16.9 seconds — ABNORMAL HIGH (ref 11.6–15.2)

## 2014-10-28 LAB — LACTATE DEHYDROGENASE: LDH: 445 U/L — ABNORMAL HIGH (ref 94–250)

## 2014-10-28 MED ORDER — SPIRONOLACTONE 12.5 MG HALF TABLET
12.5000 mg | ORAL_TABLET | Freq: Every day | ORAL | Status: DC
  Administered 2014-10-28: 12.5 mg via ORAL
  Filled 2014-10-28 (×2): qty 1

## 2014-10-28 MED ORDER — WARFARIN SODIUM 5 MG PO TABS
5.0000 mg | ORAL_TABLET | Freq: Once | ORAL | Status: AC
  Administered 2014-10-28: 5 mg via ORAL
  Filled 2014-10-28: qty 1

## 2014-10-28 MED ORDER — WARFARIN SODIUM 7.5 MG PO TABS
7.5000 mg | ORAL_TABLET | Freq: Once | ORAL | Status: DC
  Filled 2014-10-28: qty 1

## 2014-10-28 NOTE — Progress Notes (Signed)
PT Cancellation Note  Patient Details Name: Scott [name] MRN: [id number] DOB: [birth year]   Cancelled Treatment:    Reason Eval/Treat Not Completed: Patient at procedure or test/unavailable--pt up walking with Cardiac Rehab and wife. Will attempt to see later today, as schedule permits   Chen Holzman 10/28/2014, 10:31 AM Pager [phone]

## 2014-10-28 NOTE — Progress Notes (Signed)
10/28/2014 1500 Nursing note Pt. Ambulated 150 ft in hallway with RN and on RA. Pt. Did take one rest break during ambulation. Lowest PI was noted at 3.7 during ambulation. Upon return to room. Pt. Wife performed dressing change independently using sterile technique. Pt. Tolerated well.  Oliver Heitzenrater, Arville Lime

## 2014-10-28 NOTE — Progress Notes (Signed)
CARDIAC REHAB PHASE I   PRE:  Rate/Rhythm: 96 SR    BP: sitting 96 MAP    SaO2: 92-93 RA  MODE:  Ambulation: 150 ft   POST:  Rate/Rhythm: 109 ST    BP: sitting 92      SaO2: 88-89 RA  Pt not feeling as well today. C/o wooziness, "my head doesn't feel right". Increased dizziness walking, also more SOB. Sat x1. SaO2 92 RA at rest in hall but 88-89 RA when we returned to room. Pt breathing more rapidly. HR 109 ST. Lowest PI 3.7 during walk. Will f/u, pt in recliner.  [phone]  Josephina Shih Hobgood CES, ACSM 10/28/2014 10:34 AM

## 2014-10-28 NOTE — Progress Notes (Signed)
PT Cancellation Note  Patient Details Name: Scott [name] MRN: [id number] DOB: 12/18/1945   Cancelled Treatment:    Reason Eval/Treat Not Completed: Patient at procedure or test/unavailable--pt bathing. Noted pt walked this afternoon with nursing.   PT will continue to follow and reassess goals next visit.   Carter Kaman 10/28/2014, 4:14 PM  Pager [phone]

## 2014-10-28 NOTE — Progress Notes (Signed)
CSW attempted to visit with patient although patient unavailable. Patient's wife not present at the time of visit. CSW will follow up with VAD Coordinator regarding status and continue to follow for support throughout recovery. Raquel Sarna, [place]

## 2014-10-28 NOTE — Progress Notes (Signed)
ANTICOAGULATION CONSULT NOTE - Follow Up Consult  Pharmacy Consult for Heparin/warfarin Indication: LVAD/Hx of PE  No Known Allergies  Patient Measurements: Height: 5\' 8"  (172.7 cm) Weight: 171 lb 12.8 oz (77.928 kg) IBW/kg (Calculated) : 68.4 Heparin Dosing Weight: 78.5kg  Vital Signs: Temp: 98.6 F (37 C) (04/28 0546) Temp Source: Oral (04/28 0546) Pulse Rate: 102 (04/28 0546)  Labs:  Recent Labs  10/26/14 0052  10/26/14 1605  10/27/14 0512 10/27/14 1526 10/28/14 0414  HGB 7.9*  < > 9.5*  --  9.2*  --  9.1*  HCT 26.0*  < > 31.3*  --  29.9*  --  29.8*  PLT 306  < > 377  --  338  --  320  LABPROT 15.6*  --   --   --  15.4*  --  16.9*  INR 1.22  --   --   --  1.21  --  1.35  HEPARINUNFRC 0.27*  < >  --   < > 0.53 0.61 0.52  CREATININE  --   --  0.79  --  0.63  --  0.58  < > = values in this interval not displayed.  Estimated Creatinine Clearance: 87.9 mL/min (by C-G formula based on Cr of 0.58).   Medications:  Infusions:  . heparin 1,200 Units/hr (10/27/14 2359)  . lactated ringers 20 mL/hr at 10/23/14 2000    Assessment: 81 yoM s/p LVAD on 3/22. Pt w/ history of PE on chronic warfarin PTA.   INR today 1.31 (goal 1.5-2 d/t recent bleed). Pharmacy consulted to manage heparin with low goal of 0.3-0.5 and max rate of 1500 units/hr. HL this AM therapeutic at 0.5. Hgb 9.1(stable) and no bleeding noted.   INR finally responding. Will continue higher warfarin dosing as requirements seem to be much higher.  Goal of Therapy:  Heparin level 0.3-0.5 units/ml INR goal 1.5-2  Monitor platelets by anticoagulation protocol: Yes   Plan:  Reduce heparin to 1150 units/hr Daily HL/CBC/INR Monitor s/sx of bleeding Repeat Warfarin 7.5mg  tonight  Thank you for allowing pharmacy to be part of this patient's care team  Erin Hearing PharmD., BCPS Clinical Pharmacist Pager [phone] 10/28/2014 10:48 AM

## 2014-10-28 NOTE — Progress Notes (Signed)
Patient ID: Scott [name], male   DOB: 1946/06/28, 69 y.o.   MRN: [id number] HeartMate 2 Rounding Note  Subjective:    S/P HMII LVAD on 09/20/2013.   Moved to ICU  4/5 due to low MAP and low hemoglobin. Taken Back to the OR 4/5 and 4/6  for recurrent bleed and evacuation of clots from left chest.   Ramp echo 4/22 and speed increased to 9800  Back on lasix. Weight unchanged. Now on heparin for low INR. Feeling better.   Off abx. WBC 9.2 INR 1.35 LDH 437>470>445  Hgb 9.1  LVAD INTERROGATION:  HeartMate II LVAD:  Flow 5.5  liters/min, speed 9600, power 6.0  PI 6.0  Occasional PI events overnight.   Objective:    Vital Signs:   Temp:  [98.6 F (37 C)-98.7 F (37.1 C)] 98.6 F (37 C) (04/28 0546) Pulse Rate:  [96-103] 102 (04/28 0546) Resp:  [16-18] 18 (04/28 0546) SpO2:  [87 %-95 %] 94 % (04/28 0546) Weight:  [171 lb 12.8 oz (77.928 kg)] 171 lb 12.8 oz (77.928 kg) (04/28 0546) Last BM Date: 10/27/14 Mean arterial Pressure  80-108 Intake/Output:   Intake/Output Summary (Last 24 hours) at 10/28/14 0825 Last data filed at 10/28/14 0640  Gross per 24 hour  Intake    200 ml  Output    878 ml  Net   -678 ml     Physical Exam:  General: Sitting in chair HEENT: normal.  Neck: supple. JVP 8  Carotids 2+ bilat; no bruits. No lymphadenopathy or thryomegaly appreciated.  Cor: Mechanical heart sounds with LVAD hum present. Lungs: Decreased breath sounds. On room air.   Abdomen: soft, nontender, nondistended. No hepatosplenomegaly. No bruits or masses. Good bowel sounds. Driveline: Dressing looks good.  Extremities: no cyanosis, clubbing, rash.  No edema RUE PICC Neuro: Awake alert   Telemetry: NSR 90s    Labs: Basic Metabolic Panel:  Recent Labs Lab 10/22/14 0505 10/24/14 0603 10/25/14 0615 10/26/14 1605 10/27/14 0512 10/28/14 0414  NA 142 142 142 139 138 139  K 3.6 3.8 3.2* 4.2 4.0 4.4  CL 105 105 105 106 106 107  CO2 29 31 31 25 25 24   GLUCOSE 159* 103*  116* 118* 98 93  BUN 13 11 15 11 11 7   CREATININE 0.84 0.62 0.68 0.79 0.63 0.58  CALCIUM 8.3* 8.3* 8.1* 8.1* 8.0* 8.1*  MG 1.9 2.0  --   --   --   --     Liver Function Tests: No results for input(s): AST, ALT, ALKPHOS, BILITOT, PROT, ALBUMIN in the last 168 hours. No results for input(s): LIPASE, AMYLASE in the last 168 hours. No results for input(s): AMMONIA in the last 168 hours.  CBC:  Recent Labs Lab 10/26/14 0052 10/26/14 1410 10/26/14 1605 10/27/14 0512 10/28/14 0414  WBC 6.8 9.3 10.6* 9.7 9.2  HGB 7.9* 9.1* 9.5* 9.2* 9.1*  HCT 26.0* 29.7* 31.3* 29.9* 29.8*  MCV 97.4 97.4 97.2 96.8 96.1  PLT 306 368 377 338 320    INR:  Recent Labs Lab 10/24/14 0603 10/25/14 0615 10/26/14 0052 10/27/14 0512 10/28/14 0414  INR 1.35 1.24 1.22 1.21 1.35    Other results:    Imaging: Dg Chest 2 View  10/26/2014   CLINICAL DATA:  Shortness of breath, CHF and left ventricular assist device  EXAM: CHEST  2 VIEW  COMPARISON:  PA and lateral chest of October 24, 2014  FINDINGS: The lungs are adequately inflated. The interstitial markings remain  increased. Confluent patchy densities in the right lower lung persist. There is increasing density in the left retrocardiac region. There are small bilateral pleural effusions The cardiac silhouette remains enlarged. The left ventricular assist device is partially visible. The PICC line tip projects over the distal portion of the SVC. There are 8 intact sternal wires. There is stable left perihilar subsegmental atelectasis.  IMPRESSION: 1. Increased in atelectasis or pneumonia in the left lower lobe with fairly stable similar finding in the right middle lobe. 2. No evidence of pulmonary edema. The heart and visualized portions of the left ventricular assist device are unchanged.   Electronically Signed   By: David  Martinique M.D.   On: 10/26/2014 17:18     Medications:     Scheduled Medications: . antiseptic oral rinse  7 mL Mouth Rinse q12n4p   . budesonide-formoterol  2 puff Inhalation BID  . chlorhexidine  15 mL Mouth Rinse BID  . citalopram  20 mg Oral Daily  . dextromethorphan  30 mg Oral BID  . furosemide  20 mg Oral Q48H  . guaiFENesin  600 mg Oral BID  . pantoprazole  40 mg Oral QHS  . sodium chloride  10 mL Intravenous Q12H  . traZODone  50 mg Oral QHS  . Warfarin - Physician Dosing Inpatient   Does not apply q1800    Infusions: . heparin 1,200 Units/hr (10/27/14 2359)  . lactated ringers 20 mL/hr at 10/23/14 2000    PRN Medications: acetaminophen, fentaNYL (SUBLIMAZE) injection, hydrALAZINE, levalbuterol, ondansetron (ZOFRAN) IV, RESOURCE THICKENUP CLEAR, sodium chloride, traMADol   Assessment:    1.  A/C systolic HF class IV with cardiogenic shock- EF 15% with moderate RV dysfunction 3/16 echo.         -S/P HMII LVAD 09/21/14  2. CAD s/p Anterior STEMI on 06/12/14 with stenting of LAD-off Plavix.  3. Acute on chronic hypercarbic respiratory failure  4. 06/12/14 VT in setting of STEMI 5. Ankylosing spondylitis. 6. Bilateral PE with R DVT- S/P IVC filter 08/27/2014--->coumadin 7. Acute Blood Loss into pleural space:  Back to OR 4/5 and 4/6 for recurrent bleed. Received multiple blood products.  8. PAF: On amiodarone.  9. Nose Bleed.-  10. Hypokalemia  Plan/Discussion:    S/P 09/21/2014 HMII LVAD.   Back to OR 4/5 and and again 4/6 for recurrent bleed and evacuation clots. Received multiple blood products.   INR today 1.35 . Continue heparin until INR 1.5   MAP high. Start 20 mg po lasix every other day. Add 12.5 spiro.    Continue VAD teaching and ambulation. Home Health Orders placed for d/c . Will need to set up BIPAP. Care management consulted.   I reviewed the LVAD parameters from today, and compared the results to the patient's prior recorded data.  No programming changes were made.  The LVAD is functioning within specified parameters.  The patient performs LVAD self-test daily.  LVAD  interrogation was negative for any significant power changes, alarms or PI events/speed drops.  LVAD equipment check completed and is in good working order.  Back-up equipment present.   LVAD education done on emergency procedures and precautions and reviewed exit site care.   CLEGG,AMY NP-C  8:25 AM   Length of Stay: 64 VAD Team --- VAD ISSUES ONLY--- Pager [phone] (7am - 7am) Advanced Heart Failure Team  Pager [phone] (M-F; 7a - 4p)  Please contact [facility] for night-coverage after hours (4p -7a ) and weekends on [email or web address]   Patient seen  and examined with Darrick Grinder, NP. We discussed all aspects of the encounter. I agree with the assessment and plan as stated above.   Improving slowly. MAPs still up. Agree with adding spiro. INR starting to bump. Stop heparin when INR >=1.5. Continue to ambulate and educate. VAD parameters stable. Home soon.   Charistopher Kentarius [place],MD 1:18 PM

## 2014-10-29 LAB — LACTATE DEHYDROGENASE: LDH: 399 U/L — ABNORMAL HIGH (ref 94–250)

## 2014-10-29 LAB — BASIC METABOLIC PANEL
Anion gap: 7 (ref 5–15)
BUN: 8 mg/dL (ref 6–23)
CO2: 28 mmol/L (ref 19–32)
Calcium: 8 mg/dL — ABNORMAL LOW (ref 8.4–10.5)
Chloride: 106 mmol/L (ref 96–112)
Creatinine, Ser: 0.68 mg/dL (ref 0.50–1.35)
GFR calc Af Amer: >90 mL/min (ref >90)
GFR calc non Af Amer: >90 mL/min (ref >90)
Glucose, Bld: 88 mg/dL (ref 70–99)
Potassium: 3.9 mmol/L (ref 3.5–5.1)
Sodium: 141 mmol/L (ref 135–145)

## 2014-10-29 LAB — CBC
HCT: 29 % — ABNORMAL LOW (ref 39.0–52.0)
Hemoglobin: 8.9 g/dL — ABNORMAL LOW (ref 13.0–17.0)
MCH: 29.5 pg (ref 26.0–34.0)
MCHC: 30.7 g/dL (ref 30.0–36.0)
MCV: 96 fL (ref 78.0–100.0)
Platelets: 365 10*3/uL (ref 150–400)
RBC: 3.02 MIL/uL — ABNORMAL LOW (ref 4.22–5.81)
RDW: 18 % — ABNORMAL HIGH (ref 11.5–15.5)
WBC: 7.9 10*3/uL (ref 4.0–10.5)

## 2014-10-29 LAB — GLUCOSE, CAPILLARY
GLUCOSE-CAPILLARY: 149 mg/dL — AB (ref 70–99)
Glucose-Capillary: 74 mg/dL (ref 70–99)
Glucose-Capillary: 112 mg/dL — ABNORMAL HIGH (ref 70–99)
Glucose-Capillary: 105 mg/dL — ABNORMAL HIGH (ref 70–99)

## 2014-10-29 LAB — HEPARIN LEVEL (UNFRACTIONATED): Heparin Unfractionated: 0.19 IU/mL — ABNORMAL LOW (ref 0.30–0.70)

## 2014-10-29 LAB — PROTIME-INR
INR: 1.55 — ABNORMAL HIGH (ref 0.00–1.49)
Prothrombin Time: 18.7 seconds — ABNORMAL HIGH (ref 11.6–15.2)

## 2014-10-29 MED ORDER — SODIUM CHLORIDE 0.9 % IJ SOLN
10.0000 mL | INTRAMUSCULAR | Status: DC | PRN
  Administered 2014-10-30 (×2): 10 mL
  Administered 2014-10-30 (×2): 20 mL
  Administered 2014-10-31 (×2): 10 mL
  Administered 2014-11-01: 20 mL
  Administered 2014-11-02: 10 mL
  Filled 2014-10-29 (×8): qty 40

## 2014-10-29 MED ORDER — WARFARIN SODIUM 5 MG PO TABS
5.0000 mg | ORAL_TABLET | Freq: Once | ORAL | Status: AC
  Administered 2014-10-29: 5 mg via ORAL
  Filled 2014-10-29: qty 1

## 2014-10-29 NOTE — Progress Notes (Signed)
23 Days Post-Op Procedure(s) (LRB): VIDEO ASSISTED THORACOSCOPY (VATS)/THOROCOTOMY (Left) Subjective: Slept well last night but having some weak spells when he stands up Pump speed is been turned down, diuretic dose reduced Sternal wound continues to heal and skin staples are being removed daily. Antibiotics of been stopped and white count remains normal IV heparin has been stopped as INR has reached 1.5 with goal 1.5- -2.0  Objective: Vital signs in last 24 hours: Temp:  [97.5 F (36.4 C)-98.6 F (37 C)] 98.5 F (36.9 C) (04/29 1318) Pulse Rate:  [91-102] 91 (04/29 1318) Cardiac Rhythm:  [-] Normal sinus rhythm (04/29 0800) Resp:  [17-19] 17 (04/29 1318) SpO2:  [93 %-97 %] 95 % (04/29 1318) Weight:  [170 lb 3.1 oz (77.2 kg)] 170 lb 3.1 oz (77.2 kg) (04/29 0607)  Hemodynamic parameters for last 24 hours:   sinus rhythm afebrile  Intake/Output from previous day: 04/28 0701 - 04/29 0700 In: 342 [P.O.:342] Out: 1425 [Urine:1425] Intake/Output this shift: Total I/O In: 140 [P.O.:120; I.V.:20] Out: -   Alert and comfortable eating lunch Scattered rhonchi 2 exam Sternum stable well-healed  Lab Results:  Recent Labs  10/28/14 0414 10/29/14 0530  WBC 9.2 7.9  HGB 9.1* 8.9*  HCT 29.8* 29.0*  PLT 320 365   BMET:  Recent Labs  10/28/14 0414 10/29/14 0530  NA 139 141  K 4.4 3.9  CL 107 106  CO2 24 28  GLUCOSE 93 88  BUN 7 8  CREATININE 0.58 0.68  CALCIUM 8.1* 8.0*    PT/INR:  Recent Labs  10/29/14 0530  LABPROT 18.7*  INR 1.55*   ABG    Component Value Date/Time   PHART 7.335* 10/12/2014 1538   HCO3 36.6* 10/12/2014 1538   TCO2 34 10/13/2014 1615   ACIDBASEDEF 0.8 08/26/2014 2225   O2SAT 87.0 10/14/2014 0354   CBG (last 3)   Recent Labs  10/28/14 2101 10/29/14 0557 10/29/14 1121  GLUCAP 99 74 149*    Assessment/Plan: S/P Procedure(s) (LRB): VIDEO ASSISTED THORACOSCOPY (VATS)/THOROCOTOMY (Left) Agree with plan to consider inpatient  rehabilitation as the patient still admits to being very weak. 7-10 days  In CIR would probably make a big difference in his ability to transition successfully to home   LOS: 49 days    Tharon Aquas Trigt III 10/29/2014

## 2014-10-29 NOTE — Progress Notes (Signed)
Medicare Important Message given? YES  (If response is "NO", the following Medicare IM given date fields will be blank)  Date Medicare IM given: 10/29/14 Medicare IM given by:  Dahlia Client [employer]

## 2014-10-29 NOTE — Progress Notes (Signed)
Placed pt. On non-invasive.pt. Tolerating well at this time.

## 2014-10-29 NOTE — Progress Notes (Signed)
CARDIAC REHAB PHASE I   PRE:  Rate/Rhythm: 92 SR    BP: lying 104, sitting 90    SaO2: 90 RA  MODE:  Ambulation: 150 ft   POST:  Rate/Rhythm: 106 ST    BP: sitting 90     SaO2: 95 2L  Pt sts he slept well but sts hes feeling rough today. Willing to walk. MAP elevated in bed, 104. Sat on EOB and MAP 90. Pt able to change over to batteries but poor depth perception with black cord, misaligning it.   Pt sts its because he really needs bifocals. I put batteries in vest for him. Pt stood with drop in PI to 3.0, no HR decrease, maintained 96 SR. PI rebounded to 4.4 and pt began walking. Used O2 today due to SAO2 90 RA on EOB. Pt sts he is weak and SOB, legs wobbly. Sat at his normal spot (>1/2 way around circle). Rested 4 min then walked back to room. Pt seems frustrated by his lack of progress, exhausted after walk. Left pt on RA in recliner. He was able to connect to power cord, held cords in air to see better. PI drops usually to 3s then rebounds to 4s walking. 6 range when he sits.  [place], [place], ACSM 10/29/2014 11:34 AM

## 2014-10-29 NOTE — Progress Notes (Signed)
Physical Therapy Treatment Patient Details Name: Scott [name] MRN: [id number] DOB: Jan 10, 1946 Today's Date: 10/29/2014    History of Present Illness 69 y.o. M with extensive cardiac hx as well as recent PE / DVT, admitted for SOB due to heart failure, cardiogenic, shock, PE. Evaluated by VAD team. Found to have mod - large R pleural effusion. LVAD implantation 09/22/14. Progressed well until developed hemothorax and required return to OR 4/5 and 4/6 for Redo sternotomy, reexploration of chest and removal of hematoma.    PT Comments    Vitals: PI dropped 6.0 to 3.8 with first walk. Down to only 4.7 with second walk. 4.2 third walk. 5.0 fourth walk (Pt ambulated 75 ft each time with seated rests between). SaO2 on RA 93% down to 87%; on subsequent walks used 2L O2 with SaO2 90-93%.  Spoke with pt re: Dr. Clayborne Dana recommendation for Inpatient Rehab. Pt is in favor of this plan stating he doesn't feel strong enough, isn't able to walk far enough, and gets too short of breath to go home in wife's care. "she can't handle me like this." Pt has routinely been walking 75 ft, sitting to rest, and walking 75 ft a second time. When asked him to try a 3rd walk after resting, he initially refused. With much encouragement and education that he will only get stronger if he pushes himself, he begrudgingly agreed.   Asked pt if there were ADLs he is unable to do that he wants to do for himself and he said not (wife does many of these for him and he has pointed out previously that she assisted PTA due to his ankylosing spondylosis). Pt encouraged to manage his LVAD equipment himself and he defers to his wife. "She does a good job and I don't have to." He reports it is difficult for him to see connections with his glasses on. He states he removes glasses to read "and I could do that (take glasses off) to manage the lines, but that's too much trouble. Maybe I will at home."     Follow Up Recommendations  Home health PT;Supervision/Assistance - 24 hour     Equipment Recommendations  Other (comment) (2 wheeled RW with fold down seat)    Recommendations for Other Services       Precautions / Restrictions Precautions Precautions: Sternal;Fall Precaution Comments: LVAD Restrictions Other Position/Activity Restrictions: sternal--pt needs VCs; gets angry when cued not to push up from chair; able to state reason for precautions and states "if it hasn't come apart yet, then it's not going to come apart"    Mobility  Bed Mobility               General bed mobility comments: up in the chair  Transfers Overall transfer level: Needs assistance Equipment used: 4-wheeled walker Transfers: Sit to/from Stand Sit to Stand: Supervision         General transfer comment: see comments under precautions re: pushing from chair  Ambulation/Gait Ambulation/Gait assistance: Min guard Ambulation Distance (Feet): 75 Feet (x4 (with seated rest breaks between)) Assistive device: 4-wheeled walker Gait Pattern/deviations: Step-through pattern;Decreased stride length;Trunk flexed Gait velocity: slower Gait velocity interpretation: Below normal speed for age/gender General Gait Details: light use of UEs on rollator; continued flexed posture (as PTA);    Stairs            Wheelchair Mobility    Modified Rankin (Stroke Patients Only)       Balance  Standing balance-Leahy Scale: Fair                      Cognition Arousal/Alertness: Awake/alert Behavior During Therapy: WFL for tasks assessed/performed Overall Cognitive Status: Within Functional Limits for tasks assessed                      Exercises      General Comments        Pertinent Vitals/Pain Pain Assessment: No/denies pain    Home Living                      Prior Function            PT Goals (current goals can now be found in the care plan section) Acute Rehab PT  Goals Patient Stated Goal: to go home  Time For Goal Achievement: 11/01/14 (will continue goals for 1 more week and reevaluate) Potential to Achieve Goals: Good Progress towards PT goals: Progressing toward goals    Frequency  Min 3X/week    PT Plan Current plan remains appropriate    Co-evaluation             End of Session Equipment Utilized During Treatment:  (first 2 walks on RA; last 2 on 2L O2) Activity Tolerance: Patient limited by fatigue Patient left: in chair;with call bell/phone within reach;with family/visitor present (on batteries wanting to go to bathroom; RN aware)     Time: 5809-9833 PT Time Calculation (min) (ACUTE ONLY): 36 min  Charges:  $Gait Training: 23-37 mins                    G Codes:      Jveon Pound Nov 20, 2014, 4:09 PM Pager [phone]

## 2014-10-29 NOTE — Progress Notes (Signed)
Patient ID: Scott [name], male   DOB: 16-Dec-1945, 69 y.o.   MRN: [id number] HeartMate 2 Rounding Note  Subjective:    S/P HMII LVAD on 09/20/2013.   Moved to ICU  4/5 due to low MAP and low hemoglobin. Taken Back to the OR 4/5 and 4/6  for recurrent bleed and evacuation of clots from left chest.   Ramp echo 4/22 and speed increased to 9800  Back on lasix. Weight unchanged. Now on heparin for low INR. Today INR 1.55.  When he stands up PI drops and gets bradycardic  Off abx. WBC 7.9 INR 1.55 LDH 399  Hgb 8.9  LVAD INTERROGATION:  HeartMate II LVAD:  Flow 5.3  liters/min, speed 9600, power 6.0  PI 6.2  Occasional PI events overnight.   Objective:    Vital Signs:   Temp:  [97.5 F (36.4 C)-98.6 F (37 C)] 97.5 F (36.4 C) (04/29 0607) Pulse Rate:  [91-102] 91 (04/29 0607) Resp:  [19] 19 (04/28 1453) SpO2:  [93 %-97 %] 94 % (04/29 0607) Weight:  [170 lb 3.1 oz (77.2 kg)] 170 lb 3.1 oz (77.2 kg) (04/29 0607) Last BM Date: 10/28/14 Mean arterial Pressure  80-108 Intake/Output:   Intake/Output Summary (Last 24 hours) at 10/29/14 0825 Last data filed at 10/29/14 0630  Gross per 24 hour  Intake    342 ml  Output   1425 ml  Net  -1083 ml     Physical Exam:  General: Sitting in chair HEENT: normal.  Neck: supple. JVP 6  Carotids 2+ bilat; no bruits. No lymphadenopathy or thryomegaly appreciated.  Cor: Mechanical heart sounds with LVAD hum present. Lungs: Decreased breath sounds. On room air.   Abdomen: soft, nontender, nondistended. No hepatosplenomegaly. No bruits or masses. Good bowel sounds. Driveline: Dressing looks good.  Extremities: no cyanosis, clubbing, rash.  No edema RUE PICC Neuro: Awake alert   Telemetry: NSR 90s    Labs: Basic Metabolic Panel:  Recent Labs Lab 10/24/14 0603 10/25/14 0615 10/26/14 1605 10/27/14 0512 10/28/14 0414 10/29/14 0530  NA 142 142 139 138 139 141  K 3.8 3.2* 4.2 4.0 4.4 3.9  CL 105 105 106 106 107 106  CO2 31 31 25  25 24 28   GLUCOSE 103* 116* 118* 98 93 88  BUN 11 15 11 11 7 8   CREATININE 0.62 0.68 0.79 0.63 0.58 0.68  CALCIUM 8.3* 8.1* 8.1* 8.0* 8.1* 8.0*  MG 2.0  --   --   --   --   --     Liver Function Tests: No results for input(s): AST, ALT, ALKPHOS, BILITOT, PROT, ALBUMIN in the last 168 hours. No results for input(s): LIPASE, AMYLASE in the last 168 hours. No results for input(s): AMMONIA in the last 168 hours.  CBC:  Recent Labs Lab 10/26/14 1410 10/26/14 1605 10/27/14 0512 10/28/14 0414 10/29/14 0530  WBC 9.3 10.6* 9.7 9.2 7.9  HGB 9.1* 9.5* 9.2* 9.1* 8.9*  HCT 29.7* 31.3* 29.9* 29.8* 29.0*  MCV 97.4 97.2 96.8 96.1 96.0  PLT 368 377 338 320 365    INR:  Recent Labs Lab 10/25/14 0615 10/26/14 0052 10/27/14 0512 10/28/14 0414 10/29/14 0530  INR 1.24 1.22 1.21 1.35 1.55*    Other results:    Imaging: No results found.   Medications:     Scheduled Medications: . antiseptic oral rinse  7 mL Mouth Rinse q12n4p  . budesonide-formoterol  2 puff Inhalation BID  . chlorhexidine  15 mL Mouth Rinse  BID  . citalopram  20 mg Oral Daily  . dextromethorphan  30 mg Oral BID  . furosemide  20 mg Oral Q48H  . guaiFENesin  600 mg Oral BID  . pantoprazole  40 mg Oral QHS  . sodium chloride  10 mL Intravenous Q12H  . spironolactone  12.5 mg Oral Daily  . traZODone  50 mg Oral QHS  . Warfarin - Physician Dosing Inpatient   Does not apply q1800    Infusions: . heparin 1,150 Units/hr (10/28/14 2316)  . lactated ringers 20 mL/hr at 10/23/14 2000    PRN Medications: acetaminophen, fentaNYL (SUBLIMAZE) injection, hydrALAZINE, levalbuterol, ondansetron (ZOFRAN) IV, RESOURCE THICKENUP CLEAR, sodium chloride, traMADol   Assessment:    1.  A/C systolic HF class IV with cardiogenic shock- EF 15% with moderate RV dysfunction 3/16 echo.         -S/P HMII LVAD 09/21/14  2. CAD s/p Anterior STEMI on 06/12/14 with stenting of LAD-off Plavix.  3. Acute on chronic  hypercarbic respiratory failure  4. 06/12/14 VT in setting of STEMI 5. Ankylosing spondylitis. 6. Bilateral PE with R DVT- S/P IVC filter 08/27/2014--->coumadin 7. Acute Blood Loss into pleural space:  Back to OR 4/5 and 4/6 for recurrent bleed. Received multiple blood products.  8. PAF: On amiodarone.  9. Nose Bleed.-  10. Hypokalemia  Plan/Discussion:    S/P 09/21/2014 HMII LVAD.   Back to OR 4/5 and and again 4/6 for recurrent bleed and evacuation clots. Received multiple blood products.   INR today 1.55 . Stop heparin. Coumadin dosing per pharmacy. INR goal - 1.5-2.0   MAP 90. Start 20 mg po lasix every other day. Continue 12.5 spiro daily.  Bradycardia when standing.      Continue VAD teaching and ambulation. Home Health Orders placed for d/c . Will need to set up BIPAP. Care management consulted.   I reviewed the LVAD parameters from today, and compared the results to the patient's prior recorded data.  No programming changes were made.  The LVAD is functioning within specified parameters.  The patient performs LVAD self-test daily.  LVAD interrogation was negative for any significant power changes, alarms or PI events/speed drops.  LVAD equipment check completed and is in good working order.  Back-up equipment present.   LVAD education done on emergency procedures and precautions and reviewed exit site care.   CLEGG,AMY NP-C  8:25 AM   Length of Stay: 31 VAD Team --- VAD ISSUES ONLY--- Pager [phone] (7am - 7am) Advanced Heart Failure Team  Pager [phone] (M-F; 7a - 4p)  Please contact [facility] for night-coverage after hours (4p -7a ) and weekends on [email or web address]   Patient seen and examined with Darrick Grinder, NP. We discussed all aspects of the encounter. I agree with the assessment and plan as stated above.   Remains a bit tenuous. Suspect he is having suction events when he stands up. We previously stopped lasix but quickly developed pulmonary edema. Will turn VAD  speed down to 9400. Stop spiro. Continue lasix every other day. Will consult CIR. INR improving.   Jojo Jerris Fleer,MD 10:48 AM

## 2014-10-29 NOTE — Progress Notes (Addendum)
Pt HR dropped to 38 this AM while standing up to weigh, nonsustained.  Pt stated he felt very weak.  Assisted pt back to bed.  Pt HR currently 92, pt resting comfortably.  VAD provider paged and notified.  Will continue to monitor pt closely.  Claudette Stapler, RN

## 2014-10-29 NOTE — Progress Notes (Signed)
ANTICOAGULATION CONSULT NOTE - Follow Up Consult  Pharmacy Consult for Heparin/warfarin Indication: LVAD/Hx of PE  No Known Allergies  Patient Measurements: Height: 5\' 8"  (172.7 cm) Weight: 170 lb 3.1 oz (77.2 kg) IBW/kg (Calculated) : 68.4 Heparin Dosing Weight: 78.5kg  Vital Signs: Temp: 97.5 F (36.4 C) (04/29 0607) Temp Source: Oral (04/29 0607) Pulse Rate: 91 (04/29 0607)  Labs:  Recent Labs  10/27/14 0512 10/27/14 1526 10/28/14 0414 10/29/14 0530  HGB 9.2*  --  9.1* 8.9*  HCT 29.9*  --  29.8* 29.0*  PLT 338  --  320 365  LABPROT 15.4*  --  16.9* 18.7*  INR 1.21  --  1.35 1.55*  HEPARINUNFRC 0.53 0.61 0.52 0.19*  CREATININE 0.63  --  0.58 0.68    Estimated Creatinine Clearance: 87.9 mL/min (by C-G formula based on Cr of 0.68).   Medications:  Infusions:  . lactated ringers 20 mL/hr at 10/23/14 2000    Assessment: 5 yoM s/p LVAD on 3/22. Pt w/ history of PE on chronic warfarin PTA.   INR today 1.5 (goal 1.5-2 d/t recent bleed). HL low this am but ok per MD to stop heparin today.   INR finally responding. Will continue higher warfarin dosing as requirements seem to be much higher.  Goal of Therapy:  Heparin level 0.3-0.5 units/ml INR goal 1.5-2  Monitor platelets by anticoagulation protocol: Yes   Plan:  D/c heparin Monitor s/sx of bleeding Repeat Warfarin 5mg  tonight  Thank you for allowing pharmacy to be part of this patient's care team  Erin Hearing PharmD., BCPS Clinical Pharmacist Pager [phone] 10/29/2014 8:54 AM

## 2014-10-30 LAB — GLUCOSE, CAPILLARY
GLUCOSE-CAPILLARY: 116 mg/dL — AB (ref 70–99)
GLUCOSE-CAPILLARY: 104 mg/dL — AB (ref 70–99)
Glucose-Capillary: 80 mg/dL (ref 70–99)

## 2014-10-30 LAB — BASIC METABOLIC PANEL
Anion gap: 6 (ref 5–15)
BUN: 10 mg/dL (ref 6–23)
CO2: 30 mmol/L (ref 19–32)
Calcium: 8.3 mg/dL — ABNORMAL LOW (ref 8.4–10.5)
Chloride: 105 mmol/L (ref 96–112)
Creatinine, Ser: 0.67 mg/dL (ref 0.50–1.35)
GFR calc Af Amer: >90 mL/min (ref >90)
GFR calc non Af Amer: >90 mL/min (ref >90)
Glucose, Bld: 88 mg/dL (ref 70–99)
Potassium: 3.9 mmol/L (ref 3.5–5.1)
Sodium: 141 mmol/L (ref 135–145)

## 2014-10-30 LAB — CBC
HCT: 29.4 % — ABNORMAL LOW (ref 39.0–52.0)
Hemoglobin: 9 g/dL — ABNORMAL LOW (ref 13.0–17.0)
MCH: 29.1 pg (ref 26.0–34.0)
MCHC: 30.6 g/dL (ref 30.0–36.0)
MCV: 95.1 fL (ref 78.0–100.0)
Platelets: 329 10*3/uL (ref 150–400)
RBC: 3.09 MIL/uL — ABNORMAL LOW (ref 4.22–5.81)
RDW: 17.6 % — ABNORMAL HIGH (ref 11.5–15.5)
WBC: 8.5 10*3/uL (ref 4.0–10.5)

## 2014-10-30 LAB — PROTIME-INR
INR: 1.63 — ABNORMAL HIGH (ref 0.00–1.49)
Prothrombin Time: 19.5 seconds — ABNORMAL HIGH (ref 11.6–15.2)

## 2014-10-30 MED ORDER — WARFARIN - PHARMACIST DOSING INPATIENT
Freq: Every day | Status: DC
  Administered 2014-10-30 – 2014-10-31 (×2)

## 2014-10-30 MED ORDER — WARFARIN SODIUM 5 MG PO TABS
5.0000 mg | ORAL_TABLET | Freq: Once | ORAL | Status: AC
  Administered 2014-10-30: 5 mg via ORAL
  Filled 2014-10-30: qty 1

## 2014-10-30 NOTE — Progress Notes (Signed)
24 Days Post-Op Procedure(s) (LRB): VIDEO ASSISTED THORACOSCOPY (VATS)/THOROCOTOMY (Left) Subjective: Slept well- walked Remains weak- would benefit from CIR INR at goal- Hb stable MAP 90, nsr  Objective: Vital signs in last 24 hours: Temp:  [98.5 F (36.9 C)-98.6 F (37 C)] 98.6 F (37 C) (04/29 2200) Pulse Rate:  [91-96] 96 (04/29 2336) Cardiac Rhythm:  [-] Normal sinus rhythm (04/30 0828) Resp:  [17] 17 (04/29 2336) SpO2:  [94 %-96 %] 95 % (04/30 0925)  Hemodynamic parameters for last 24 hours:  stable  Intake/Output from previous day: 04/29 0701 - 04/30 0700 In: 620 [P.O.:600; I.V.:20] Out: -  Intake/Output this shift: Total I/O In: 20 [I.V.:20] Out: 401 [Urine:400; Stool:1]  Incisions healing  Lab Results:  Recent Labs  10/29/14 0530 10/30/14 0525  WBC 7.9 8.5  HGB 8.9* 9.0*  HCT 29.0* 29.4*  PLT 365 329   BMET:  Recent Labs  10/29/14 0530 10/30/14 0525  NA 141 141  K 3.9 3.9  CL 106 105  CO2 28 30  GLUCOSE 88 88  BUN 8 10  CREATININE 0.68 0.67  CALCIUM 8.0* 8.3*    PT/INR:  Recent Labs  10/30/14 0525  LABPROT 19.5*  INR 1.63*   ABG    Component Value Date/Time   PHART 7.335* 10/12/2014 1538   HCO3 36.6* 10/12/2014 1538   TCO2 34 10/13/2014 1615   ACIDBASEDEF 0.8 08/26/2014 2225   O2SAT 87.0 10/14/2014 0354   CBG (last 3)   Recent Labs  10/29/14 1611 10/29/14 2159 10/30/14 0654  GLUCAP 112* 105* 80    Assessment/Plan: S/P Procedure(s) (LRB): VIDEO ASSISTED THORACOSCOPY (VATS)/THOROCOTOMY (Left) CIR consult Cont current care   LOS: 50 days    Tharon Aquas Trigt III 10/30/2014

## 2014-10-30 NOTE — Progress Notes (Signed)
CARDIAC REHAB PHASE I   PRE:  Rate/Rhythm: 95 SR  BP:  Supine:   Sitting: 86 MAP   Standing:    SaO2: 91%RA  MODE:  Ambulation: 230 ft   POST:  Rate/Rhythm: 106  BP:  Supine:   Sitting: 88 MAP  Standing:    SaO2: 94% 2L 1045-1110 Pt already to batteries and ready to walk. Walked 230 ft on 2L with rollator and asst x 1. Sat once at 100 ft to rest. Denied dizziness. Pt stated did not know if oxygen really helped or not but for him it was security. Left to batteries at pt's request with RN and wife in room. PI 6.4 before walk. 4.4 in hall sitting and 5.1 when back to room.   Graylon Good, RN BSN  10/30/2014 11:07 AM

## 2014-10-30 NOTE — Progress Notes (Signed)
Patient ID: Scott [name], male   DOB: 1946-03-12, 69 y.o.   MRN: [id number] HeartMate 2 Rounding Note  Subjective:    S/P HMII LVAD on 09/20/2013.   Moved to ICU  4/5 due to low MAP and low hemoglobin. Taken Back to the OR 4/5 and 4/6  for recurrent bleed and evacuation of clots from left chest.   Ramp echo 4/22 and speed increased to 9800  VAD speed turned down to 9400 yesterday due to probable suction events. Feels better. Getting lasix every other day. Remains very weak after prolonged hospital stay. INR 1.6. MAPs 80s  LVAD INTERROGATION:  HeartMate II LVAD:  Flow 5.1  liters/min, speed 9400, power 6.0  PI 6.4  Occasional PI events overnight.   Objective:    Vital Signs:   Temp:  [98.6 F (37 C)] 98.6 F (37 C) (04/29 2200) Pulse Rate:  [95-96] 96 (04/29 2336) Resp:  [17] 17 (04/29 2336) SpO2:  [94 %-96 %] 95 % (04/30 0925) Last BM Date: 10/30/14 Mean arterial Pressure  80-108 Intake/Output:   Intake/Output Summary (Last 24 hours) at 10/30/14 1401 Last data filed at 10/30/14 1300  Gross per 24 hour  Intake    740 ml  Output    401 ml  Net    339 ml     Physical Exam:  General: Sitting in chair HEENT: normal.  Neck: supple. JVP 8-9  Carotids 2+ bilat; no bruits. No lymphadenopathy or thryomegaly appreciated.  Cor: Mechanical heart sounds with LVAD hum present. Lungs: Decreased breath sounds. On room air.   Abdomen: soft, nontender, nondistended. No hepatosplenomegaly. No bruits or masses. Good bowel sounds. Driveline: Dressing looks good.  Extremities: no cyanosis, clubbing, rash.  No edema RUE PICC Neuro: Awake alert   Telemetry: NSR 90-100   Labs: Basic Metabolic Panel:  Recent Labs Lab 10/24/14 0603  10/26/14 1605 10/27/14 0512 10/28/14 0414 10/29/14 0530 10/30/14 0525  NA 142  < > 139 138 139 141 141  K 3.8  < > 4.2 4.0 4.4 3.9 3.9  CL 105  < > 106 106 107 106 105  CO2 31  < > 25 25 24 28 30   GLUCOSE 103*  < > 118* 98 93 88 88  BUN 11  < >  11 11 7 8 10   CREATININE 0.62  < > 0.79 0.63 0.58 0.68 0.67  CALCIUM 8.3*  < > 8.1* 8.0* 8.1* 8.0* 8.3*  MG 2.0  --   --   --   --   --   --   < > = values in this interval not displayed.  Liver Function Tests: No results for input(s): AST, ALT, ALKPHOS, BILITOT, PROT, ALBUMIN in the last 168 hours. No results for input(s): LIPASE, AMYLASE in the last 168 hours. No results for input(s): AMMONIA in the last 168 hours.  CBC:  Recent Labs Lab 10/26/14 1605 10/27/14 0512 10/28/14 0414 10/29/14 0530 10/30/14 0525  WBC 10.6* 9.7 9.2 7.9 8.5  HGB 9.5* 9.2* 9.1* 8.9* 9.0*  HCT 31.3* 29.9* 29.8* 29.0* 29.4*  MCV 97.2 96.8 96.1 96.0 95.1  PLT 377 338 320 365 329    INR:  Recent Labs Lab 10/26/14 0052 10/27/14 0512 10/28/14 0414 10/29/14 0530 10/30/14 0525  INR 1.22 1.21 1.35 1.55* 1.63*    Other results:    Imaging: No results found.   Medications:     Scheduled Medications: . antiseptic oral rinse  7 mL Mouth Rinse q12n4p  . budesonide-formoterol  2 puff Inhalation BID  . chlorhexidine  15 mL Mouth Rinse BID  . citalopram  20 mg Oral Daily  . dextromethorphan  30 mg Oral BID  . furosemide  20 mg Oral Q48H  . guaiFENesin  600 mg Oral BID  . pantoprazole  40 mg Oral QHS  . sodium chloride  10 mL Intravenous Q12H  . traZODone  50 mg Oral QHS  . warfarin  5 mg Oral ONCE-1800  . Warfarin - Pharmacist Dosing Inpatient   Does not apply q1800    Infusions: . lactated ringers 20 mL/hr at 10/23/14 2000    PRN Medications: acetaminophen, fentaNYL (SUBLIMAZE) injection, hydrALAZINE, levalbuterol, ondansetron (ZOFRAN) IV, RESOURCE THICKENUP CLEAR, sodium chloride, sodium chloride, traMADol   Assessment:    1.  A/C systolic HF class IV with cardiogenic shock- EF 15% with moderate RV dysfunction 3/16 echo.         -S/P HMII LVAD 09/21/14  2. CAD s/p Anterior STEMI on 06/12/14 with stenting of LAD-off Plavix.  3. Acute on chronic hypercarbic respiratory failure   4. 06/12/14 VT in setting of STEMI 5. Ankylosing spondylitis. 6. Bilateral PE with R DVT- S/P IVC filter 08/27/2014--->coumadin 7. Acute Blood Loss into pleural space:  Back to OR 4/5 and 4/6 for recurrent bleed. Received multiple blood products.  8. PAF: On amiodarone.  9. Nose Bleed.-  10. Hypokalemia  Plan/Discussion:    S/P 09/21/2014 HMII LVAD. Back to OR 4/5 and and again 4/6 for recurrent bleed and evacuation clots. Received multiple blood products.   INR today 1.65 . Stop heparin. Coumadin dosing per pharmacy. INR goal - 1.5-2.0   MAPs good. VAD speed turned down yesterday due to suction events. Lasix every other day. Weight stable. PI drops to low 4s now when standing but no bradycardia. Will continue to watch. May need to go to 9200.   He is very deconditioned from 50 day hospital stay. Will ask CIR to see.   I reviewed the LVAD parameters from today, and compared the results to the patient's prior recorded data.  No programming changes were made.  The LVAD is functioning within specified parameters.  The patient performs LVAD self-test daily.  LVAD interrogation was negative for any significant power changes, alarms or PI events/speed drops.  LVAD equipment check completed and is in good working order.  Back-up equipment present.   LVAD education done on emergency procedures and precautions and reviewed exit site care.   [name] MD  2:01 PM   Length of Stay: 82 VAD Team --- VAD ISSUES ONLY--- Pager [phone] (7am - 7am) Advanced Heart Failure Team  Pager [phone] (M-F; 7a - 4p)  Please contact [facility] for night-coverage after hours (4p -7a ) and weekends on [email or web address]

## 2014-10-30 NOTE — Progress Notes (Signed)
10/30/2014 3:16 PM Driveline dressing changed per sterile technique by RN.  Driveline anchor also changed. Carney Corners

## 2014-10-30 NOTE — Progress Notes (Signed)
10/30/2014 1400 Pt ambulated 165ft with rolling walker.  2L 02 on.  No rest breaks taken.  Pt. Tolerated well.   Carney Corners

## 2014-10-30 NOTE — Progress Notes (Signed)
ANTICOAGULATION CONSULT NOTE - Follow Up Consult  Pharmacy Consult for warfarin Indication: LVAD/Hx of PE  No Known Allergies  Patient Measurements: Height: 5\' 8"  (172.7 cm) Weight:  (patient refused) IBW/kg (Calculated) : 68.4 Heparin Dosing Weight: 78.5kg  Vital Signs:    Labs:  Recent Labs  10/27/14 1526  10/28/14 0414 10/29/14 0530 10/30/14 0525  HGB  --   < > 9.1* 8.9* 9.0*  HCT  --   --  29.8* 29.0* 29.4*  PLT  --   --  320 365 329  LABPROT  --   --  16.9* 18.7* 19.5*  INR  --   --  1.35 1.55* 1.63*  HEPARINUNFRC 0.61  --  0.52 0.19*  --   CREATININE  --   --  0.58 0.68 0.67  < > = values in this interval not displayed.  Estimated Creatinine Clearance: 87.9 mL/min (by C-G formula based on Cr of 0.67).   Medications:  Infusions:  . lactated ringers 20 mL/hr at 10/23/14 2000    Assessment: 5 yoM s/p LVAD on 3/22. Pt w/ history of PE IVC filter on chronic warfarin PTA.   AC: LVAD, Hx PE, IVC filter,  Warfarin/plavix stopped 3/17preop bridge hep/tirofiban, warf restarted 3/23, did not receive dose per PVT request on 4/4 so he could pull pacer wires, all AC d/c'ed 4/5 as pt had to go back to OR for left hemothorax, hematoma, coagulopathy Warfarin resumed 4/8 (goal 1.5-2),  heparin stopped 4/29 Nose bleed overnight 4/20, no further bleeding noted. After antibiotics stopped required about 5.4 mg daily to get to an INR of 1.5, INR now at goal  PTA Warfarin 2mg  MonFri, 4mg  other days  Goal of Therapy:  INR goal 1.5-2  Monitor platelets by anticoagulation protocol: Yes   Plan:  Monitor s/sx of bleeding Repeat Warfarin 5mg  tonight Daily INR  Thank you for allowing pharmacy to be a part of this patients care team.  Rowe Robert Pharm.D., BCPS, AQ-Cardiology Clinical Pharmacist 10/30/2014 12:31 PM Pager: [phone] Phone: [phone]

## 2014-10-31 LAB — GLUCOSE, CAPILLARY
GLUCOSE-CAPILLARY: 158 mg/dL — AB (ref 70–99)
GLUCOSE-CAPILLARY: 111 mg/dL — AB (ref 70–99)
Glucose-Capillary: 93 mg/dL (ref 70–99)
Glucose-Capillary: 100 mg/dL — ABNORMAL HIGH (ref 70–99)

## 2014-10-31 LAB — PROTIME-INR
INR: 1.87 — AB (ref 0.00–1.49)
Prothrombin Time: 21.7 seconds — ABNORMAL HIGH (ref 11.6–15.2)

## 2014-10-31 MED ORDER — WARFARIN SODIUM 2 MG PO TABS
2.0000 mg | ORAL_TABLET | Freq: Once | ORAL | Status: AC
Start: 2014-10-31 — End: 2014-10-31
  Administered 2014-10-31: 2 mg via ORAL
  Filled 2014-10-31: qty 1

## 2014-10-31 NOTE — Consult Note (Signed)
General Aspect Primary Cardiologist: Dr. Haroldine Laws, MD _______________  69 year old male with history of CAD with stenting of LCX 2013 with recent history of STEMI 06/12/2014 s/p PCI to LAD complicated by post-cath shock required IABP then developed VT s/p DCCV, EF 20%, NSTEMI 06/26/2014 treated medically, DM2, acute respiratory failure, carotid stenting, and ankylosing spondylitis who presented to [facility] 08/22/2013 with 5 day history of worsening SOB and was found to have bilateral PE.  _______________  PMH: 1. CAD with stenting of LCX 2013 with recent history of STEMI 06/12/2014 s/p PCI to LAD complicated by post-cath shock required IABP then developed VT s/p DCCV, EF 20% 2. NSTEMI 06/28/2014 treated medically 3. DM2 4. Acute respiratory failure 5. Carotid stenting 6. Ankylosing spondylitis _______________   Present Illness 69 year old male with the above problem list who presented to [facility] with a 5 day history of increasing SOB and was found to have bilateral PE. He was admitted 06/12/2014 with anterior STEMI and taken to the cath lab emergently with Dr. Gwenlyn Found and underwent?? PCI to LAD. In the lab LVEDP was very high and BP was low so IABP placed.?? He was transferred to the CCU. On arrival to CCU, he developed VT and respiratory failure post cath that required emergent cariovesions and short term intubation. He was placed on amiodarone drip and after he was adequately loaded he was??transitioned to po. At the time of discharge he maintained NSR.   He has severe LV dysfunction on ECHO with EF 20%. He was started on low dose carvedilol, spironolactone, however arb was not added due to soft bp. He was discharged with Life Vest and will continue until he has repeat ECHO. He will continue to be followed closely in the HF clinic and has follow up 06/22/14 at 10 am. He has been referred to cardiac rehab. At his first HF vist with Dr. Haroldine Laws he was Feeling tired. Mild DOE. No edema, orthopnea or PND.  Wearing LifeVest. Weighing daily. Losing about a pound every day. Weight now 208. No dizziness. Not taking lasix. Norvasc was stopped and Coreg was increased to 6.25 mg bid, unfortunately he did not tolerate this increase 2/2 low BP and he was forced to go back to 3.125 mg bid. He takes indomethacin for his ankylosing sponylitis. On 12/26 he was admitted to [facility] for NSTEMI with peak troponin of 2.50. He was not seen by his primary cardiology group [place]. He was seen by [facility]. His Aldactone was stopped. In follow up with [place] his losartan was cut in half. When he followed back up with his primary cardiologist 07/2014 his SBP running 84-104 (mostly in low 90s). Feeling tired/lousy. Trying to walk 15 mins bid. No CP.?? No edema, orthopnea or PND. Wearing LifeVest. Not sleeping well. Denies PND/orthopnea. Has tried melatonin with no help. Weight down 210-> 197. In follow up with Dr. Haroldine Laws 08/2014 Says he was doing well until Feb 2 when HR jumped from the 60s to 65-105. Had ECG on Feb 8th which showed NSR at 94. Was having more pain in sternum from ankylosing spondylosis. Sulfasalazine not helping. Started back on 1 indomethacin pill again and HR came back to 70-80s. Stopped indomethacin and HR back up. Continues to wear LifeVest. No firings. SBP. SBP running 84-104 (mostly in low 90s). Holds losartan if BP < 90. No CP.?? No edema, orthopnea or PND. Has good days and bad days. When he feels good can walk 15 mins in am and 15 mins in pm.  Does that about 3x/week. Weight stable 194-196. Is pending CPX testing. He was started on Colanor 5 mg bid 2/2 elevated HR.   He called the answering service on 2/21 complaining of increased SOB. Was having issues with ankylosing spondylitis pain and not supposed take indocin due to heart issues. He took extra sulfalzine as directed by his rheumatologist which made him sick. He has been increasingly SOB and having weight gain, orthopnea and PND. He was advised to come to [facility]. He noted  trace bilateral swelling along the ankles, o/w no swelling. Some hemoptysis. Weight has remained around 195 to 196. Some bloating. Blood pressure has been slightly high for him (90s/70s). Upon his arrival to [facility] he was found to have bilateral PE. He was started on heparin gtt. Troponin 0.21-->0.19. WBC 20.1-->17.6.   Physical Exam:  GEN no acute distress   HEENT hearing intact to voice   NECK supple   RESP normal resp effort  clear BS   CARD Regular rate and rhythm  No murmur   ABD denies tenderness  soft   EXTR trace non-pitting edema along the ankles   SKIN normal to palpation   NEURO cranial nerves intact   PSYCH alert, A+O to time, place, person, good insight   Review of Systems:  General: No Complaints   Skin: No Complaints   ENT: No Complaints   Eyes: No Complaints   Neck: No Complaints   Respiratory: Frequent cough  Hemopytsis  Short of breath   Cardiovascular: Orthopnea  Edema   Gastrointestinal: No Complaints   Genitourinary: No Complaints   Vascular: No Complaints   Musculoskeletal: No Complaints   Neurologic: No Complaints   Hematologic: No Complaints   Endocrine: No Complaints   Psychiatric: No Complaints   Review of Systems: All other systems were reviewed and found to be negative   Medications/Allergies Reviewed Medications/Allergies reviewed   Family & Social History:  Family and Social History:  Family History Hypertension   Social History negative ETOH, negative Illicit drugs   + Tobacco Prior (greater than 1 year)   Place of Living Home     cardiac stent:    cholecystitis:    cad:    bradycardia:    vertigo:    EF 30%:    kidney stones:    gallstones:    Hypertension:    Hypercholesterolemia:    Ankylosing Spondylitis:    Carotid Stent Placement: Jan 2013  Home Medications: Medication Instructions Status  carvedilol 3.125 mg oral tablet 1 tab(s) orally 2 times a day Active  Crestor 10 mg oral tablet  1 tab(s) orally once a day (at bedtime) Active  nitroglycerin 0.4 mg sublingual tablet 1 tab(s) sublingual every 5 minutes, As Needed, chest pain , As needed, chest pain Active  indomethacin 75 mg oral capsule, extended release 1 cap(s) orally once a day Active  losartan 25 mg oral tablet 0.5 tab(s) orally once a day unless systolic pressure is under 90 Active  ivabradine 5 mg oral tablet 1 tab(s) orally 2 times a day (with meals) Active  zolpidem 10 mg oral tablet 1 tab(s) orally once a day (at bedtime) Active  traZODone 100 mg oral tablet 1 tab(s) orally once a day (at bedtime), As Needed - for Inability to Sleep Active  clopidogrel 75 mg oral tablet 1 tab(s) orally once a day Active  digoxin 125 mcg (0.125 mg) oral tablet 1 tab(s) orally once a day Active  pantoprazole 40 mg oral delayed release tablet 1  tab(s) orally once a day Active  metaxalone 800 mg oral tablet 1 tab(s) orally once a day, As Needed Active  aspirin 81 mg oral tablet 1 tab(s) orally once a day Active   Lab Results:  Hepatic:  22-Feb-16 03:33   Bilirubin, Total 0.7  Alkaline Phosphatase  220  SGPT (ALT) 39  SGOT (AST) 35  Total Protein, Serum 6.9  Albumin, Serum  2.6  Routine Chem:  21-Feb-16 18:17   Creatinine (comp)  1.48  B-Type Natriuretic Peptide [facility])  15441 (Result(s) reported on 22 Aug 2014 at 07:00PM.)  22-Feb-16 03:33   Result Comment TROPONIN - RESULTS VERIFIED BY REPEAT TESTING.  - ELEVATED TROPONIN PREVIOUSLY CALLED AT  - 1904 08/22/14.PMH  Result(s) reported on 23 Aug 2014 at 04:52AM.  Glucose, Serum  121  BUN  38  Creatinine (comp)  1.55  Sodium, Serum 142  Potassium, Serum 4.3  Chloride, Serum  108  CO2, Serum 27  Calcium (Total), Serum 8.5  Osmolality (calc) 293  eGFR (African American)  58  eGFR (Non-African American)  48 (eGFR values <30m/min/1.73 m2 may be an indication of chronic kidney disease (CKD). Calculated eGFR, using the MRDR Study equation, is useful in  patients with  stable renal function. The eGFR calculation will not be reliable in acutely ill patients when serum creatinine is changing rapidly. It is not useful in patients on dialysis. The eGFR calculation may not be applicable to patients at the low and high extremes of body sizes, pregnant women, and vegetarians.)  Anion Gap 7  Cholesterol, Serum 97  Triglycerides, Serum 47  HDL (INHOUSE)  32  VLDL Cholesterol Calculated 9  LDL Cholesterol Calculated 56 (Result(s) reported on 23 Aug 2014 at 05:17AM.)  Magnesium, Serum  2.5 (1.8-2.4 THERAPEUTIC RANGE: 4-7 mg/dL TOXIC: > 10 mg/dL  -----------------------)  Cardiac:  21-Feb-16 18:17   Troponin I  0.24 (0.00-0.05 0.05 ng/mL or less: NEGATIVE  Repeat testing in 3-6 hrs  if clinically indicated. >0.05 ng/mL: POTENTIAL  MYOCARDIAL INJURY. Repeat  testing in 3-6 hrs if  clinically indicated. NOTE: An increase or decrease  of 30% or more on serial  testing suggests a  clinically important change)    22:52   Troponin I  0.21 (0.00-0.05 0.05 ng/mL or less: NEGATIVE  Repeat testing in 3-6 hrs  if clinically indicated. >0.05 ng/mL: POTENTIAL  MYOCARDIAL INJURY. Repeat  testing in 3-6 hrs if  clinically indicated. NOTE: An increase or decrease  of 30% or more on serial  testing suggests a  clinically important change)  22-Feb-16 03:33   Troponin I  0.19 (0.00-0.05 0.05 ng/mL or less: NEGATIVE  Repeat testing in 3-6 hrs  if clinically indicated. >0.05 ng/mL: POTENTIAL  MYOCARDIAL INJURY. Repeat  testing in 3-6 hrs if  clinically indicated. NOTE: An increase or decrease  of 30% or more on serial  testing suggests a  clinically important change)  Routine Hem:  22-Feb-16 03:33   WBC (CBC)  17.6  RBC (CBC)  3.59  Hemoglobin (CBC)  10.7  Hematocrit (CBC)  34.0  Platelet Count (CBC) 317 (Result(s) reported on 23 Aug 2014 at 04:35AM.)  MCV 95  MCH 29.9  MCHC  31.5  RDW  15.6  Segmented Neutrophils 82  Lymphocytes 9  Monocytes 9   Diff Comment 1 ANISOCYTOSIS  Diff Comment 2 POIKILOCYTOSIS  Diff Comment 3 POLYCHROMASIA  Diff Comment 4 NORMAL PLT MORPHOLGY  Result(s) reported on 23 Aug 2014 at 04:35AM.   EKG:  EKG Interp.  by me   Interpretation NSR, 81 bpm, no st/t changes   Radiology Results: XRay:    21-Feb-16 18:44, Chest PA and Lateral  Chest PA and Lateral   REASON FOR EXAM:    Shortness of Breath  COMMENTS:   May transport without cardiac monitor    PROCEDURE: DXR - DXR CHEST PA (OR AP) AND LATERAL  - Aug 22 2014  6:44PM     CLINICAL DATA:  69 year old male with history of shortness of breath  for 2 days. Prior myocardial infarction December 2015.    EXAM:  CHEST - 2 VIEW    COMPARISON:  Multiple prior chest x-ray most recent 06/26/2014,  06/13/2014, plain film pelvis 06/13/2014    FINDINGS:  Cardiomediastinal silhouette unchanged in size and contour,  borderline enlarged. Atherosclerotic calcifications of the aortic  arch.    Re- demonstration tortuosity of the descending thoracic aorta.    No confluent airspace disease, with improved airspace opacities from  the comparison plain film. No pleural effusion or pneumothorax.    No evidence of displaced fracture.    Lateral view demonstrates syndesmophyte formation along the length  of the visualized spine. Osteopenia.     IMPRESSION:  No radiographic evidence of acute cardiopulmonary disease, with  improved aeration compared to the prior plain film.    Atherosclerosis.    Syndesmophyte formation is present throughout the length of the  visualized spine, and a comparison plain film of the pelvis  demonstrates complete ankylosis of the bilateral sacroiliac joints.  These findings can be seen with seropositive arthritides such as  ankylosing spondylitis. If the patient has not yet undergone  rheumatologic evaluation, consideration of a referral may be useful.    Signed,    Dulcy Fanny. Earleen Newport, DO  [facility]    [facility]      Electronically Signed    By: Corrie Mckusick D.O.    On: 08/22/2014 18:51         Verified By: Johny Shears, M.D.,    Sulfasalazine: Resp. Distress  Vital Signs/Nurse's Notes: **Vital Signs.:   22-Feb-16 05:00  Vital Signs Type Routine  Temperature Temperature (F) 98.1  Celsius 36.7  Temperature Source oral  Pulse Pulse 78  Respirations Respirations 29  Systolic BP Systolic BP 86  Diastolic BP (mmHg) Diastolic BP (mmHg) 65  Mean BP 72  Pulse Ox % Pulse Ox % 96  Pulse Ox Activity Level  At rest  Oxygen Delivery 2L    Impression 69 year old male with history of CAD with stenting of LCX 2013 with recent history of STEMI 06/12/2014 s/p PCI to LAD complicated by post-cath shock required IABP then developed VT s/p DCCV, EF 20%, NSTEMI 06/26/2014 treated medically, DM2, acute respiratory failure, carotid stenting, and ankylosing spondylitis who presented to [facility] 08/22/2013 with 5 day history of worsening SOB and was found to have bilateral PE.  1. Bilateral PE: -Heparin gtt -Xarelto or Eliquis -Patient with recent PCI of LAD 06/2014, continue Plavix for at least 12 months would stop aspirin at this time (triple therapy) -Check echo  2. Chronic systolic CHF: -Euvolemic -Check echo as above (not yet at 3 month window for possible AICD) -Pressures at home have limited titration of medications -BP currently soft (90s/70s), which is actually good for him -Restart patient's home Coreg 3.125 mg bid (he takes this unless his SBP is less than 90) -Restart patient's losartan 50 mg daily (he takes this unless his SBP is  less than 90)  3. CAD s/p stenting as above: -Continue Plavix -Coreg as above -Stop aspirin (triple therapy) -Crestor 10 mg  4. Hypotension: -BP is actually improved for him   Electronic Signatures for Addendum Section:  Kathlyn Sacramento (MD) (Signed Addendum [phone] 09:19)  The patient was seen and examined. AGree  with the above. He has known history of CAD s/p recent LAD PCI and severe ischemic cardiomyopathy who presented with worsening dyspnea and was found to have bilateral PE. No murmurs by exam.  Agree with anticoagulation. Can use Xarelto or Eliquis. Continue Plavix but Aspirin can be stopped to minimize risk of bleeding on triple therapy.   Electronic Signatures: Kathlyn Sacramento (MD)  (Signed [phone] 09:19)  Co-Signer: General Aspect/Present Illness, History and Physical Exam, Review of System, Family & Social History, Past Medical History, Home Medications, Labs, EKG , Radiology, Allergies, Vital Signs/Nurse's Notes, Impression/Plan Seylah Wernert M (PA-C)  (Signed 22-Feb-16 09:05)  Authored: General Aspect/Present Illness, History and Physical Exam, Review of System, Family & Social History, Past Medical History, Home Medications, Labs, EKG , Radiology, Allergies, Vital Signs/Nurse's Notes, Impression/Plan   Last Updated: 22-Feb-16 09:19 by Kathlyn Sacramento (MD)

## 2014-10-31 NOTE — Progress Notes (Signed)
10/31/2014 1400 Pt ambulated 1104ft with a rolling walker on RA.  Pt had to stop x1 for a rest.  Otherwise tolerated well. [place]

## 2014-10-31 NOTE — Discharge Summary (Signed)
PATIENT NAME:  Scott [name], Scott [name] MR#:  [id number] DATE OF BIRTH:  05-17-1946  DATE OF ADMISSION:  08/22/2014 DATE OF DISCHARGE:  08/26/2014  DISPOSITION: Transferred to [facility], congestive heart failure unit.  DISCHARGE DIAGNOSES: 1.  Acute pulmonary embolus, bilateral, with residual right lower extremity deep vein thrombosis.  2.  Congestive heart failure with an ejection fraction of 20%, acute on chronic systolic.  3.  Coronary artery disease status post ST-segment elevation myocardial infarction in December.  4.  Leukocytosis, possible pneumonia.   CONSULTING PHYSICIANS: Dr. Rockey Situ with cardiology and Dr. Mortimer Fries with pulmonary.   HISTORY AND HOSPITAL COURSE:  1.  This is a 69 year old gentleman with history of severe CHF following a non-STEMI in December 2016 where he was admitted to [facility]. He follows with Dr. Haroldine Laws as an outpatient. He was admitted with increasing shortness of breath for several days as well as hemoptysis. The patient was found on CT imaging to have multiple bilateral PEs. He was admitted and started on a heparin drip. He was seen by pulmonary as well as cardiology. Placement of an IVC filter was discussed with the family extensively, however, they are hesitant to proceed with this unless it is absolutely necessary. He was started on Coumadin. He continued to have some mild hemoptysis, but his hemoglobin remained stable.  2.  CHF, EF less than 20 as well as with valvular dysfunction. He is on losartan and Coreg and digoxin as an outpatient. His meds were titrated. He was continuously followed closely by Dr. Rockey Situ and Dr. Fletcher Anon of [facility] Cardiology. He will be transferred for higher level of care at [facility] CHF unit.  3.  Coronary artery disease. He has had no chest pain since admission and has been relatively stable. Decision was made to continue his Plavix due to recent stenting. However, his aspirin has been stopped because he is now on heparin and Coumadin. He remains on  a PPI for GI prophylaxis.  4.  Leukocytosis and possible pneumonia on CT of his chest. He was started on February 24th on ceftriaxone and azithromycin. Sputum culture was ordered, but has yet to be obtained. His white count has remained elevated, but it is stable. He has had no fevers.   DISCHARGE MEDICATIONS: Please see [facility] physician transfer form and include:   1.  The patient is on a heparin drip as well as Coumadin. The heparin drip is at a dose of 1800 mL/h. The Coumadin was started on February 24th at 5 mg. 2.  Tylenol 325. 3.  Plavix 75 mg once a day. 4.  Digoxin 0.125 daily. 5.  Nitroglycerin p.r.n. sublingual. 6.  Pantoprazole 40 mg once a day. 7.  Ondansetron 4 mg injection IV p.r.n. nausea.  8.  Crestor 10 mg at bedtime.  9.  Senna 1 tablet b.i.d. for constipation.  10.  Trazodone 100 mg at bedtime p.r.n. insomnia.  11.  Losartan 25 mg once a day.  12.  Coreg 3.125 once a day.  13.  Ceftriaxone 1 gram q. 24.  14.  Azithromycin 250 daily.  15.  Lasix 40 mg daily.  16.  Guaifenesin 600 mg twice a day.  17.  Advair inhaler 1 inhalation b.i.d.  18.  Ambien 5 mg at bedtime.   DIAGNOSTIC DATA: Labs at discharge, on February 25th, include renal function with a creatinine of 1.26 and a BUN of 34. His potassium is 4.5. Other renal labs are normal. White blood count 21,000, hemoglobin 10.7, platelets 393,000. INR 1.4.  Urinalysis done on admission showed 5 white cells.  CODE STATUS: FULL.  DISCHARGE INSTRUCTIONS: The patient's followup will be determined after discharge from [place]: 45 minutes.  ____________________________ Cheral Marker. Ola Spurr, MD dpf:sb D: 08/26/2014 13:44:07 ET T: 08/26/2014 14:05:35 ET JOB#: [id number]  cc: Cheral Marker. Ola Spurr, MD, <Dictator> DAVID Ola Spurr MD ELECTRONICALLY SIGNED 09/02/2014 19:52

## 2014-10-31 NOTE — H&P (Signed)
PATIENT NAME:  Scott [name], Scott [name] MR#:  [id number] DATE OF BIRTH:  25-Dec-1945  DATE OF ADMISSION:  08/22/2014  PRIMARY CARE PHYSICIAN: John B. Sarina Ser, MD   REFERRING PHYSICIAN: Algis Liming. Jimmye Norman, MD   CHIEF COMPLAINT: Worsening shortness of breath for the past 5 days.   HISTORY OF PRESENT ILLNESS: A 69 year old Caucasian male with a history of MI, CAD, and CHF, who presented to the ED with worsening shortness of breath for the past 5 days. The patient is alert, awake, oriented, in no acute distress. Actually, the patient had an MI last December and had a stent by Dr. Gwenlyn Found at [facility]. The patient has had shortness of breath since that time. The patient also was diagnosed with CHF with ejection fraction only 20%. The patient was initially treated with Lasix, but since the patient had low blood pressure, the patient's Lasix was discontinued. The patient usually walks 15 minutes in the morning and 15 minutes in the afternoon, but for the past 3 weeks, the patient has had generalized weakness and less physical activity, and for the past 1 week, the patient has not been able to walk. The patient started to have worsening shortness of breath 5 days ago. In addition, the patient had hemoptysis since then. He described the hemoptysis is fresh blood with some blood clot. The patient denies any leg swelling. No orthopnea or nocturnal dyspnea. The patient said he lost weight (25 pounds) since December. The patient's CAT scan of the chest showed bilateral PE.   PAST MEDICAL HISTORY:  1.  CAD, MI with stent placement.  2.  Chronic systolic CHF, ejection fraction 20%  3.  Ankylosing spondylitis  PAST SURGICAL HISTORY: Carpal tunnel release in 1987, recent heart catheterization and stent placement last December at [facility].   FAMILY HISTORY: CAD, Crohn's disease.   SOCIAL HISTORY: No smoking or drinking or illicit drugs.   ALLERGIES: SULFASALAZINE.   HOME MEDICATIONS: Ambien  10 mg p.o. daily at bedtime, trazodone 100 mg p.o. once a day at bedtime p.r.n., pantoprazole 40 mg p.o. daily, nitroglycerin 0.4 mg 1 tablet sublingual every 5 minutes for chest pain p.r.n., metaxalone 800 mg p.o. once a day p.r.n., losartan 25 mg p.o. 0.5 tablet once a day, ivabradine 5 mg p.o. twice a day, indomethacin 75 mg p.o. daily, digoxin 125 mcg p.o. daily, Crestor 10 mg p.o. at bedtime, Plavix 75 mg p.o. daily, Coreg 3.125 mg p.o. b.i.d., aspirin 81 mg p.o. daily.   REVIEW OF SYSTEMS: CONSTITUTIONAL: The patient denies any fever or chills. No headache, but has dizziness and generalized weakness.  EYES: No double vision or blurred vision.  EARS, NOSE, THROAT: No postnasal drip, slurred speech, or dysphagia.  CARDIOVASCULAR: No chest pain, palpitation, orthopnea, or nocturnal dyspnea. No leg edema.  PULMONARY: Positive for cough, sputum, shortness of breath, and hemoptysis, but no wheezing.  GASTROINTESTINAL: No abdominal pain, nausea, vomiting, or diarrhea. No melena or bloody stool.  GENITOURINARY: No dysuria, hematuria, or incontinence.  SKIN: No rash or jaundice.  NEUROLOGIC: No syncope, loss of consciousness, or seizure.  ENDOCRINE: No polyuria, polydipsia, heat or cold intolerance.  HEMATOLOGIC: No easy bleeding or bleeding.   PHYSICAL EXAMINATION:  VITAL SIGNS: Temperature 97.8, blood pressure 95/69, it was 91/64, pulse 76, O2 saturation 100% on oxygen by nasal cannula.  GENERAL: The patient is alert, awake, oriented, in no acute distress.  HEENT: Pupils round, equal, and reactive to light and accommodation. Moist oral mucosa. Clear oropharynx.  NECK:  Supple. No JVD or carotid bruit. No lymphadenopathy. No thyromegaly.  CARDIOVASCULAR: S1 and S2. Regular rate and rhythm. No murmurs or gallops.  PULMONARY: Bilateral air entry, mild rales on bilateral bases. No wheezing. No use of accessory muscles to breathe.  ABDOMEN: Soft. No distention. No tenderness. No organomegaly. Bowel  sounds present.  EXTREMITIES: Trace edema on bilateral lower extremities. No clubbing or cyanosis. No calf tenderness. Bilateral pedal pulses present.  SKIN: No rash or jaundice.  NEUROLOGY: A and O x 3. No focal deficit. Power 4/5. Sensation intact.   DIAGNOSTIC DATA:  1.  CT angiogram of chest showed multiple bilateral central and segmental pulmonary emboli. No definite heart strain, but diffuse 4-chamber cardiac dilatation present. Irregularity of the apex of the left ventricle may represent thickening or mural thrombus, infarct versus less likely pneumonia in the right lung base.  2.  Chest x-ray: No radiographic evidence of acute cardiopulmonary disease.  3.  BNP 15,441, CK 42, CK-MB 1.2. D-dimer more than 7500. CBC showed WBC 20.1, hemoglobin 12.2, platelets 354,000. Glucose 124, BUN 34, creatinine 1.48, electrolytes normal. Troponin 0.24. INR 1.3.  4.  EKG showed normal sinus rhythm at 81 BPM.   IMPRESSIONS:  1.  Acute bilateral multiple pulmonary embolism.  2.  Elevated troponin, possibly due to demand ischemia.  3.  History of chronic systolic congestive heart failure with ejection fraction 20%.  4.  History of myocardial infarction and coronary artery disease.  5.  Questionable cardiac thrombus. 6.  Leukocytosis, possibly due to reaction.  7.  Acute renal failure.   PLAN OF TREATMENT:  1.  The patient will be admitted to stepdown unit. We will closely monitor the patient's vital signs. Start heparin. The patient was treated with heparin bolus. We will continue heparin drip. Followup PTT.  2.  We will get a echocardiogram to evaluate the patient's cardiac function and possible cardiac thrombus. We will get a cardiology consult from [facility].  3.  For low blood pressure, we will hold Coreg and losartan. No Lasix at this time due to low blood pressure.  4.  For CAD, we will continue aspirin, Plavix, and Crestor.  5.  For acute renal failure, we will monitor the patient's BMP. 6.   Since the patient has bilateral multiple PE, I will check a venous duplex of bilateral lower extremities to rule out DVT.  7.  I discussed the patient's critical condition with the patient and the patient's wife. The patient wants FULL CODE.   TIME SPENT: About 65 minutes.    ____________________________ Demetrios Loll, MD qc:ts D: 08/22/2014 20:48:00 ET T: 08/22/2014 21:14:18 ET JOB#: [id number]  cc: Demetrios Loll, MD, <Dictator> Demetrios Loll MD ELECTRONICALLY SIGNED 08/23/2014 21:22

## 2014-10-31 NOTE — Progress Notes (Signed)
Patient ID: Scott [name], male   DOB: [birth year], 69 y.o.   MRN: [id number] HeartMate 2 Rounding Note  Subjective:    S/P HMII LVAD on 09/20/2013.   Moved to ICU  4/5 due to low MAP and low hemoglobin. Taken Back to the OR 4/5 and 4/6  for recurrent bleed and evacuation of clots from left chest.   Ramp echo 4/22 and speed increased to 9800  VAD speed turned down to 9400 on 4/29 due to probable suction events. Feels better. Getting lasix every other day. Remains very weak after prolonged hospital stay. INR 1.9. MAPs 80s. Weight trending down.   LVAD INTERROGATION:  HeartMate II LVAD:  Flow 4.7  liters/min, speed 9400, power 6.0  PI 7.1 Occasional PI events overnight.   Objective:    Vital Signs:   Temp:  [97.8 F (36.6 C)-98.1 F (36.7 C)] 97.8 F (36.6 C) (05/01 0450) Pulse Rate:  [93-94] 94 (05/01 0450) Resp:  [18-20] 20 (05/01 0450) SpO2:  [93 %-95 %] 95 % (05/01 0749) Weight:  [76.2 kg (167 lb 15.9 oz)] 76.2 kg (167 lb 15.9 oz) (05/01 0450) Last BM Date: 11/29/14 Mean arterial Pressure  80-108 Intake/Output:   Intake/Output Summary (Last 24 hours) at 10/31/14 0941 Last data filed at 10/31/14 0900  Gross per 24 hour  Intake    860 ml  Output    775 ml  Net     85 ml     Physical Exam:  General: Sitting in bed HEENT: normal.  Neck: supple. JVP 8  Carotids 2+ bilat; no bruits. No lymphadenopathy or thryomegaly appreciated.  Cor: Mechanical heart sounds with LVAD hum present. Lungs: Decreased breath sounds. On room air.   Abdomen: soft, nontender, nondistended. No hepatosplenomegaly. No bruits or masses. Good bowel sounds. Driveline: Dressing looks good.  Extremities: no cyanosis, clubbing, rash.  No edema RUE PICC Neuro: Awake alert   Telemetry: NSR 90-100   Labs: Basic Metabolic Panel:  Recent Labs Lab 10/26/14 1605 10/27/14 0512 10/28/14 0414 10/29/14 0530 10/30/14 0525  NA 139 138 139 141 141  K 4.2 4.0 4.4 3.9 3.9  CL 106 106 107 106 105  CO2  25 25 24 28 30   GLUCOSE 118* 98 93 88 88  BUN 11 11 7 8 10   CREATININE 0.79 0.63 0.58 0.68 0.67  CALCIUM 8.1* 8.0* 8.1* 8.0* 8.3*    Liver Function Tests: No results for input(s): AST, ALT, ALKPHOS, BILITOT, PROT, ALBUMIN in the last 168 hours. No results for input(s): LIPASE, AMYLASE in the last 168 hours. No results for input(s): AMMONIA in the last 168 hours.  CBC:  Recent Labs Lab 10/26/14 1605 10/27/14 0512 10/28/14 0414 10/29/14 0530 10/30/14 0525  WBC 10.6* 9.7 9.2 7.9 8.5  HGB 9.5* 9.2* 9.1* 8.9* 9.0*  HCT 31.3* 29.9* 29.8* 29.0* 29.4*  MCV 97.2 96.8 96.1 96.0 95.1  PLT 377 338 320 365 329    INR:  Recent Labs Lab 10/27/14 0512 10/28/14 0414 10/29/14 0530 10/30/14 0525 10/31/14 0604  INR 1.21 1.35 1.55* 1.63* 1.87*    Other results:    Imaging: No results found.   Medications:     Scheduled Medications: . antiseptic oral rinse  7 mL Mouth Rinse q12n4p  . budesonide-formoterol  2 puff Inhalation BID  . chlorhexidine  15 mL Mouth Rinse BID  . citalopram  20 mg Oral Daily  . dextromethorphan  30 mg Oral BID  . furosemide  20 mg Oral Q48H  .  guaiFENesin  600 mg Oral BID  . pantoprazole  40 mg Oral QHS  . sodium chloride  10 mL Intravenous Q12H  . traZODone  50 mg Oral QHS  . Warfarin - Pharmacist Dosing Inpatient   Does not apply q1800    Infusions: . lactated ringers 20 mL/hr at 10/23/14 2000    PRN Medications: acetaminophen, fentaNYL (SUBLIMAZE) injection, hydrALAZINE, levalbuterol, ondansetron (ZOFRAN) IV, RESOURCE THICKENUP CLEAR, sodium chloride, sodium chloride, traMADol   Assessment:    1.  A/C systolic HF class IV with cardiogenic shock- EF 15% with moderate RV dysfunction 3/16 echo.         -S/P HMII LVAD 09/21/14  2. CAD s/p Anterior STEMI on 06/12/14 with stenting of LAD-off Plavix.  3. Acute on chronic hypercarbic respiratory failure  4. 06/12/14 VT in setting of STEMI 5. Ankylosing spondylitis. 6. Bilateral PE with R  DVT- S/P IVC filter 08/27/2014--->coumadin 7. Acute Blood Loss into pleural space:  Back to OR 4/5 and 4/6 for recurrent bleed. Received multiple blood products.  8. PAF: On amiodarone.  9. Nose Bleed.-  10. Hypokalemia  Plan/Discussion:    S/P 09/21/2014 HMII LVAD. Back to OR 4/5 and and again 4/6 for recurrent bleed and evacuation clots. Received multiple blood products.   INR today 1.87 . Coumadin dosing per pharmacy. INR goal - 1.5-2.0   MAPs good. VAD speed turned down 2 days ago due to suction events. Lasix every other day. Weight stable. PI drops to low 4s now when standing but no bradycardia. Will continue to watch. May need to go to 9200.  He is very deconditioned from 50 day hospital stay. Will ask CIR to see. Otherwise home early this week with HHPT.   I reviewed the LVAD parameters from today, and compared the results to the patient's prior recorded data.  No programming changes were made.  The LVAD is functioning within specified parameters.  The patient performs LVAD self-test daily.  LVAD interrogation was negative for any significant power changes, alarms or PI events/speed drops.  LVAD equipment check completed and is in good working order.  Back-up equipment present.   LVAD education done on emergency procedures and precautions and reviewed exit site care.   [name] MD  9:41 AM   Length of Stay: 6 VAD Team --- VAD ISSUES ONLY--- Pager [phone] (7am - 7am) Advanced Heart Failure Team  Pager [phone] (M-F; 7a - 4p)  Please contact [facility] for night-coverage after hours (4p -7a ) and weekends on [email or web address]

## 2014-10-31 NOTE — Progress Notes (Signed)
ANTICOAGULATION CONSULT NOTE - Follow Up Consult  Pharmacy Consult for warfarin Indication: LVAD/Hx of PE  No Known Allergies  Patient Measurements: Height: 5\' 8"  (172.7 cm) Weight: 167 lb 15.9 oz (76.2 kg) IBW/kg (Calculated) : 68.4 Heparin Dosing Weight: 78.5kg  Vital Signs: Temp: 97.8 F (36.6 C) (05/01 0450) Temp Source: Oral (05/01 0450) Pulse Rate: 94 (05/01 0450)  Labs:  Recent Labs  10/29/14 0530 10/30/14 0525 10/31/14 0604  HGB 8.9* 9.0*  --   HCT 29.0* 29.4*  --   PLT 365 329  --   LABPROT 18.7* 19.5* 21.7*  INR 1.55* 1.63* 1.87*  HEPARINUNFRC 0.19*  --   --   CREATININE 0.68 0.67  --     Estimated Creatinine Clearance: 87.9 mL/min (by C-G formula based on Cr of 0.67).   Medications:  Infusions:  . lactated ringers 20 mL/hr at 10/23/14 2000    Assessment: 8 yoM s/p LVAD on 3/22. Pt w/ history of PE IVC filter on chronic warfarin PTA.   AC: LVAD, Hx PE, IVC filter,  Warfarin/plavix stopped 3/17preop bridge hep/tirofiban, warf restarted 3/23, did not receive dose per PVT request on 4/4 so he could pull pacer wires, all AC d/c'ed 4/5 as pt had to go back to OR for left hemothorax, hematoma, coagulopathy Warfarin resumed 4/8 (goal 1.5-2),  heparin stopped 4/29 Nose bleed overnight 4/20, no further bleeding noted. After antibiotics stopped required about 5.4 mg daily to get to an INR of 1.5, INR now at goal and trend is rising.    PTA Warfarin 2mg  MonFri, 4mg  other days  Goal of Therapy:  INR goal 1.5-2  Monitor platelets by anticoagulation protocol: Yes   Plan:  Monitor s/sx of bleeding Warfarin 2-mg tonight Daily INR  Thank you for allowing pharmacy to be a part of this patients care team.  Rowe Robert Pharm.D., BCPS, AQ-Cardiology Clinical Pharmacist 10/31/2014 12:21 PM Pager: [phone] Phone: [phone]

## 2014-11-01 DIAGNOSIS — R5381 Other malaise: Secondary | ICD-10-CM

## 2014-11-01 LAB — PROTIME-INR
INR: 2.1 — ABNORMAL HIGH (ref 0.00–1.49)
Prothrombin Time: 23.7 seconds — ABNORMAL HIGH (ref 11.6–15.2)

## 2014-11-01 LAB — CBC
HCT: 29.4 % — ABNORMAL LOW (ref 39.0–52.0)
Hemoglobin: 9 g/dL — ABNORMAL LOW (ref 13.0–17.0)
MCH: 29 pg (ref 26.0–34.0)
MCHC: 30.6 g/dL (ref 30.0–36.0)
MCV: 94.8 fL (ref 78.0–100.0)
Platelets: 386 10*3/uL (ref 150–400)
RBC: 3.1 MIL/uL — ABNORMAL LOW (ref 4.22–5.81)
RDW: 17.6 % — ABNORMAL HIGH (ref 11.5–15.5)
WBC: 11.8 10*3/uL — ABNORMAL HIGH (ref 4.0–10.5)

## 2014-11-01 LAB — GLUCOSE, CAPILLARY
GLUCOSE-CAPILLARY: 94 mg/dL (ref 70–99)
GLUCOSE-CAPILLARY: 136 mg/dL — AB (ref 70–99)
Glucose-Capillary: 130 mg/dL — ABNORMAL HIGH (ref 70–99)
Glucose-Capillary: 130 mg/dL — ABNORMAL HIGH (ref 70–99)

## 2014-11-01 LAB — LACTATE DEHYDROGENASE: LDH: 399 U/L — ABNORMAL HIGH (ref 98–192)

## 2014-11-01 MED ORDER — WARFARIN SODIUM 1 MG PO TABS
1.5000 mg | ORAL_TABLET | Freq: Once | ORAL | Status: AC
  Administered 2014-11-01: 1.5 mg via ORAL
  Filled 2014-11-01: qty 1

## 2014-11-01 NOTE — Consult Note (Signed)
Physical Medicine and Rehabilitation Consult  Reason for Consult: Deconditioning Referring Physician: Dr. Sung Amabile   HPI: Scott [name] is a 69 y.o. male with history of HTN, DM type 2, Ankylosing spondylitis, B-PE and RLE DVT, recent MI with stenting complicated by shock and VT requiring Life vest. He was admitted via [place] on 09/10/14 with worsening of heart failure and cardiogenic shock. He underwent cardiac cath and advanced attempts at management of fluid overload with unsuccessful. He was felt to be a good VAD candidate with good emotional and social support. He underwent HMII LVAD on 09/20/2013. Post op course complicated by recurrent bleed requiring evacuation on 04/5 and 04/06. Patient treated with antibiotics for leucocytosis and antibiotics narrowed to cefepime by Dr. Linus Salmons with recommendations to d/c antibiotics if cultures negative. Patient has had increased dyspnea due to PNA v/s HF and was treated with IV diuretics.  Repeat swallow evaluations without overt signs of aspiration and patient on regular textures with thin liquids. Patient noted to be deconditioned, needs encouragement for activity and reported non-compliant with sternal precautions. MD recommending CIR for follow up therapy.   Pt lives with wife who assists him at home Pt amb to bathroom without device only RN supervision per pt report  Review of Systems  HENT: Negative.   Respiratory: Positive for cough.   Cardiovascular: Positive for chest pain.  Gastrointestinal: Negative.   Genitourinary: Negative.   Musculoskeletal: Positive for myalgias.  Skin: Negative.   Neurological: Positive for weakness.  Endo/Heme/Allergies: Negative.        Past Medical History  Diagnosis Date  . Hyperlipidemia   . Hypertension   . Diabetes   . Nephrolithiasis   . Ankylosing spondylitis   . CAD (coronary artery disease)     a. stenting of LCx 2013; b. STEMI 06/12/14 s/p PCI to LAD complicated by post cath shock  requiring IABP; VT s/p DCCV, EF 20%; c. NSTEMI 06/26/14 treated medically.  . Ischemic cardiomyopathy     a. echo 08/23/2014 EF <20%, dilated CM, mod MR/TR  . Acute on chronic respiratory failure     a. 08/2014 in setting of PE.  . Bilateral pulmonary embolism     a. 08/2014 - started on Coumadin. Retrievable IVC filter placed 08/27/14 due to RV strain and large clot burden.  . Right leg DVT     a. 08/2014.  Marland Kitchen Chronic systolic CHF (congestive heart failure)   . Hypotension   . Hemoptysis     a. 08/2014 possibly due to pulm infarct/PE.  Marland Kitchen Pleural effusion on right 08/2014 - small  . Leukocytosis   . Carotid artery disease     a. s/p stenting.  . Ventricular tachycardia     a. 06/2014 at time of MI, s/p DCCV.  Marland Kitchen Reactive thrombocytosis 09/10/2014  . Leukocytosis 09/10/2014    Past Surgical History  Procedure Laterality Date  . Left heart catheterization with coronary angiogram N/A 06/12/2014    Procedure: LEFT HEART CATHETERIZATION WITH CORONARY ANGIOGRAM;  Surgeon: Lorretta Harp, MD;  Location: [facility] CATH LAB;  Service: Cardiovascular;  Laterality: N/A;  . Right heart catheterization N/A 09/10/2014    Procedure: RIGHT HEART CATH;  Surgeon: [name], MD;  Location: [facility] CATH LAB;  Service: Cardiovascular;  Laterality: N/A;  . Right heart catheterization N/A 09/17/2014    Procedure: RIGHT HEART CATH;  Surgeon: [name], MD;  Location: [facility] CATH LAB;  Service: Cardiovascular;  Laterality: N/A;  . Insertion of implantable  left ventricular assist device N/A 09/21/2014    Procedure: INSERTION OF IMPLANTABLE LEFT VENTRICULAR ASSIST DEVICE;  Surgeon: Ivin Poot, MD;  Location: [place];  Service: Open Heart Surgery;  Laterality: N/A;  CIRC ARREST  NITRIC OXIDE  . Tee without cardioversion N/A 09/21/2014    Procedure: TRANSESOPHAGEAL ECHOCARDIOGRAM (TEE);  Surgeon: Ivin Poot, MD;  Location: [place];  Service: Open Heart Surgery;  Laterality: N/A;  . Tee without cardioversion N/A  10/05/2014    Procedure: TRANSESOPHAGEAL ECHOCARDIOGRAM (TEE);  Surgeon: Ivin Poot, MD;  Location: [place];  Service: Open Heart Surgery;  Laterality: N/A;  . Bedside evacuation of hematoma  10/05/2014    Procedure: EVACUATION OF HEMATOMA;  Surgeon: Ivin Poot, MD;  Location: [place];  Service: Open Heart Surgery;;  . Video assisted thoracoscopy (vats)/thorocotomy Left 10/06/2014    Procedure: VIDEO ASSISTED THORACOSCOPY (VATS)/THOROCOTOMY;  Surgeon: Ivin Poot, MD;  Location: [place];  Service: Open Heart Surgery;  Laterality: Left;    Family History  Problem Relation Age of Onset  . Hypertension Mother     Social History:  Married. Independent PTA for mobility. Wife assists with ADL due to ankylosis spondylitis. Per  reports that he quit smoking about 41 years ago. He does not have any smokeless tobacco history on file. He reports that he does not drink alcohol or use illicit drugs.    Allergies: No Known Allergies    Medications Prior to Admission  Medication Sig Dispense Refill  . rosuvastatin (CRESTOR) 10 MG tablet Take 10 mg by mouth daily.    Marland Kitchen zolpidem (AMBIEN) 10 MG tablet Take 1 tablet (10 mg total) by mouth at bedtime as needed for sleep. 20 tablet 3  . [DISCONTINUED] acetaminophen (TYLENOL) 500 MG tablet Take 1,000 mg by mouth every 6 (six) hours as needed.    . [DISCONTINUED] carvedilol (COREG) 6.25 MG tablet Take 0.5 tablets (3.125 mg total) by mouth 2 (two) times daily. 180 tablet 3  . [DISCONTINUED] clopidogrel (PLAVIX) 75 MG tablet Take 1 tablet (75 mg total) by mouth daily with breakfast. 90 tablet 3  . [DISCONTINUED] digoxin (LANOXIN) 0.125 MG tablet Take 1 tablet (0.125 mg total) by mouth daily. 90 tablet 3  . [DISCONTINUED] furosemide (LASIX) 40 MG tablet Take 1 tablet (40mg ) by mouth on Mondays and Fridays. Hold if weight is 196 lbs or less. 30 tablet 2  . [DISCONTINUED] ivabradine (CORLANOR) 5 MG TABS tablet Take 1 tablet (5 mg total) by mouth 2 (two) times  daily with a meal. 60 tablet 3  . [DISCONTINUED] loperamide (IMODIUM) 1 MG/5ML solution Take 2 mg by mouth as needed for diarrhea or loose stools.    . [DISCONTINUED] losartan (COZAAR) 25 MG tablet Take 0.5 tablets (12.5 mg total) by mouth daily. Only takes when BP is higher then 90 SBP 30 tablet 6  . [DISCONTINUED] metaxalone (SKELAXIN) 800 MG tablet Take 800 mg by mouth daily as needed. As needed    . [DISCONTINUED] Multiple Vitamins-Minerals (MULTIVITAMIN WITH MINERALS) tablet Take 1 tablet by mouth daily.    . [DISCONTINUED] pantoprazole (PROTONIX) 40 MG tablet Take 1 tablet (40 mg total) by mouth daily. 90 tablet 3  . [DISCONTINUED] potassium chloride 20 MEQ TBCR Take 1 tablet (40meq) by mouth on Mondays and Fridays (with Lasix). Hold if weight is 196 lbs or less. 30 tablet 2  . [DISCONTINUED] SIMETHICONE PO Take 1 tablet by mouth daily as needed (bloating). Take 1 chewable tablet of [facility] [employer] as  needed for bloating.    . [DISCONTINUED] traZODone (DESYREL) 100 MG tablet Take 50 mg by mouth at bedtime.    . [DISCONTINUED] warfarin (COUMADIN) 4 MG tablet Take 1 tablet (4 mg total) by mouth daily. (Patient taking differently: Take 2-4 mg by mouth daily. Patient takes 2 mg on Monday and Friday. All other days patient takes 4 mg) 30 tablet 2  . [DISCONTINUED] nitroGLYCERIN (NITROSTAT) 0.3 MG SL tablet Place 0.3 mg under the tongue every 5 (five) minutes as needed for chest pain.      Home: Home Living Family/patient expects to be discharged to:: Private residence Living Arrangements: Spouse/significant other Available Help at Discharge: Family, Available 24 hours/day Type of Home: House Home Access: Stairs to enter [employer] of Steps: 2 Entrance Stairs-Rails: None Home Layout: Two level, Able to live on main level with bedroom/bathroom Home Equipment: None  Functional History: Prior Function Level of Independence: Independent Functional Status:  Mobility: Bed  Mobility Overal bed mobility: Independent Bed Mobility: Sit to Supine Supine to sit: Mod assist, HOB elevated Sit to supine: Modified independent (Device/Increase time) Sit to sidelying: Independent General bed mobility comments: up in the chair Transfers Overall transfer level: Needs assistance Equipment used: 4-wheeled walker Transfers: Sit to/from Stand Sit to Stand: Supervision Stand pivot transfers: Mod assist, +2 physical assistance, +2 safety/equipment General transfer comment: see comments under precautions re: pushing from chair Ambulation/Gait Ambulation/Gait assistance: Min guard Ambulation Distance (Feet): 75 Feet (x4 (with seated rest breaks between)) Assistive device: 4-wheeled walker General Gait Details: light use of UEs on rollator; continued flexed posture (as PTA);  Gait Pattern/deviations: Step-through pattern, Decreased stride length, Trunk flexed Gait velocity: slower Gait velocity interpretation: Below normal speed for age/gender    ADL: ADL Overall ADL's : Needs assistance/impaired Eating/Feeding: Set up, Sitting Eating/Feeding Details (indicate cue type and reason): drink Grooming: Oral care, Wash/dry hands, Wash/dry face, Sitting, Standing, Set up Grooming Details (indicate cue type and reason): sat on 3 in 1 at sink for face washing, stood to brush teeth Upper Body Bathing: Minimal assitance, Sitting Upper Body Bathing Details (indicate cue type and reason): assisted for back Lower Body Bathing: Total assistance, Sit to/from stand Lower Body Bathing Details (indicate cue type and reason): refused to attempt, no interest in AE Upper Body Dressing : Sitting, Minimal assistance Upper Body Dressing Details (indicate cue type and reason): assist mostly for lines Lower Body Dressing: Total assistance, Sitting/lateral leans Lower Body Dressing Details (indicate cue type and reason): back limits, no interest in AE Toilet Transfer: Supervision/safety,  Ambulation, RW (chair>around circle on 2W>bed) Toileting- Clothing Manipulation and Hygiene: Total assistance, Sit to/from stand Toileting - Clothing Manipulation Details (indicate cue type and reason): refuses to attempt Functional mobility during ADLs: Minimal assistance, +2 for safety/equipment General ADL Comments: I left energy conservation handout in room earlier when pt was getting ready to have an echocardiogram completed. His wife reports that she read it and that only about 1/3 of it would really apply to her husband. I explained to pt that one of the big things is that it takes less energy to do something sitting than it does in standing and that if he was completing a task that was going to take a while then he needed to try and sit to do as much of it as he can. Pt was able to change from battery pack to main power source with only his wife assisting him with handing him the main power source cords. He started  to do the black cord first, but then self corrected and said he needed to do the white cord first. Wife felt like it would be good for pt to have a 3n1 espcially at night so he wouldn't be trying to get up in the middle of the night going to the bathroom and getting tangled up in his LVAD cords and she can use it as a shower seat in their walk in shower.  Cognition: Cognition Overall Cognitive Status: Within Functional Limits for tasks assessed Orientation Level: Oriented X4 Cognition Arousal/Alertness: Awake/alert Behavior During Therapy: WFL for tasks assessed/performed Overall Cognitive Status: Within Functional Limits for tasks assessed Memory:  (VCs needed for sternal precautions for sit<>stand)  Blood pressure 91/66, pulse 93, temperature 97.5 F (36.4 C), temperature source Oral, resp. rate 20, height 5\' 8"  (1.727 m), weight 76.839 kg (169 lb 6.4 oz), SpO2 97 %. Physical Exam  Nursing note and vitals reviewed. Constitutional: He is oriented to person, place, and time. He  appears well-developed and well-nourished.  HENT:  Head: Normocephalic and atraumatic.  Eyes: Conjunctivae and EOM are normal. Pupils are equal, round, and reactive to light.  Neck: Normal range of motion.  Cardiovascular:  LVAD hum  Respiratory: Effort normal. He has wheezes. He has rales.  Tenderness Sternotomy area and Left CT site  GI: Soft. Bowel sounds are normal. He exhibits no distension. There is no tenderness.  Musculoskeletal:  No pain with BUE adn BLE ROM  Neurological: He is alert and oriented to person, place, and time.  Skin: Skin is warm.  Sternotomy site CDI  Psychiatric: He has a normal mood and affect.  Motor 4/5 BUE adn BLE Ttransfers Sit to Stand with Supp , uses arms on arm rest, no chest pain with this  Results for orders placed or performed during the hospital encounter of 09/10/14 (from the past 24 hour(s))  Glucose, capillary     Status: Abnormal   Collection Time: 10/31/14 11:08 AM  Result Value Ref Range   Glucose-Capillary 100 (H) 70 - 99 mg/dL   Comment 1 Notify RN   Glucose, capillary     Status: Abnormal   Collection Time: 10/31/14  4:31 PM  Result Value Ref Range   Glucose-Capillary 158 (H) 70 - 99 mg/dL  Glucose, capillary     Status: Abnormal   Collection Time: 10/31/14  9:30 PM  Result Value Ref Range   Glucose-Capillary 111 (H) 70 - 99 mg/dL  Protime-INR     Status: Abnormal   Collection Time: 11/01/14  5:58 AM  Result Value Ref Range   Prothrombin Time 23.7 (H) 11.6 - 15.2 seconds   INR 2.10 (H) 0.00 - 1.49  Lactate dehydrogenase     Status: Abnormal   Collection Time: 11/01/14  5:58 AM  Result Value Ref Range   LDH 399 (H) 98 - 192 U/L  CBC     Status: Abnormal   Collection Time: 11/01/14  5:58 AM  Result Value Ref Range   WBC 11.8 (H) 4.0 - 10.5 K/uL   RBC 3.10 (L) 4.22 - 5.81 MIL/uL   Hemoglobin 9.0 (L) 13.0 - 17.0 g/dL   HCT 29.4 (L) 39.0 - 52.0 %   MCV 94.8 78.0 - 100.0 fL   MCH 29.0 26.0 - 34.0 pg   MCHC 30.6 30.0 - 36.0  g/dL   RDW 17.6 (H) 11.5 - 15.5 %   Platelets 386 150 - 400 K/uL  Glucose, capillary     Status: None  Collection Time: 11/01/14  6:58 AM  Result Value Ref Range   Glucose-Capillary 94 70 - 99 mg/dL   No results found.  Assessment/Plan: Diagnosis: Debility due to CAD and CHF s/p recent LVAD 1. Does the need for close, 24 hr/day medical supervision in concert with the patient's rehab needs make it unreasonable for this patient to be served in a less intensive setting? No 2. Co-Morbidities requiring supervision/potential complications: ankylosing spondylitis 3. Due to bladder management, bowel management, safety, skin/wound care, disease management, medication administration, pain management and patient education, does the patient require 24 hr/day rehab nursing? Potentially 4. Does the patient require coordinated care of a physician, rehab nurse, PT,OT to address physical and functional deficits in the context of the above medical diagnosis(es)? Potentially Addressing deficits in the following areas: balance, endurance, locomotion, strength, transferring, bowel/bladder control, bathing and dressing 5. Can the patient actively participate in an intensive therapy program of at least 3 hrs of therapy per day at least 5 days per week? Yes 6. The potential for patient to make measurable gains while on inpatient rehab is Limited improvement since already at sup level for mobility 7. Anticipated functional outcomes upon discharge from inpatient rehab are supervision  with PT, supervision with OT, n/a with SLP. 8. Estimated rehab length of stay to reach the above functional goals is: NA 9. Does the patient have adequate social supports and living environment to accommodate these discharge functional goals? Yes 10. Anticipated D/C setting: Home 11. Anticipated post D/C treatments: [place] therapy 12. Overall Rehab/Functional Prognosis: excellent  RECOMMENDATIONS: This patient's condition is appropriate  for continued rehabilitative care in the following setting: [facility] Therapy Patient has agreed to participate in recommended program. Yes Note that insurance prior authorization may be required for reimbursement for recommended care.  Comment: Hi level from mobility standpoint    11/01/2014

## 2014-11-01 NOTE — Progress Notes (Signed)
MAP on 8:00 rounds 112, HR 94. At 0820 pt had coughing spell with expectoration of mucous plug. PI dropped to 2.6, MAP 68, HR 78. PI coming back up, after 5 minutes, 4.0. Pt c/o deep chest discomfort due to coughing, but denies lightheadedness or other symptoms. Resting in chair.

## 2014-11-01 NOTE — Progress Notes (Signed)
26 Days Post-Op Procedure(s) (LRB): VIDEO ASSISTED THORACOSCOPY (VATS)/THOROCOTOMY (Left) Subjective: Looks great Walked 2 laps INR 2.0 Incisions all healing No edema Plan DC home in am Objective: Vital signs in last 24 hours: Temp:  [97.5 F (36.4 C)-98.3 F (36.8 C)] 97.5 F (36.4 C) (05/02 0526) Pulse Rate:  [93-99] 93 (05/02 0526) Cardiac Rhythm:  [-] Normal sinus rhythm (05/02 0754) Resp:  [18-20] 20 (05/01 2306) SpO2:  [97 %] 97 % (05/02 0526) Weight:  [169 lb 6.4 oz (76.839 kg)] 169 lb 6.4 oz (76.839 kg) (05/02 0526)  Hemodynamic parameters for last 24 hours:  stable- nsr  Intake/Output from previous day: 05/01 0701 - 05/02 0700 In: 120 [P.O.:120] Out: 295 [Urine:295] Intake/Output this shift: Total I/O In: 120 [P.O.:120] Out: 225 [Urine:225]  Lungs clear Normal VAD hum  Lab Results:  Recent Labs  10/30/14 0525 11/01/14 0558  WBC 8.5 11.8*  HGB 9.0* 9.0*  HCT 29.4* 29.4*  PLT 329 386   BMET:  Recent Labs  10/30/14 0525  NA 141  K 3.9  CL 105  CO2 30  GLUCOSE 88  BUN 10  CREATININE 0.67  CALCIUM 8.3*    PT/INR:  Recent Labs  11/01/14 0558  LABPROT 23.7*  INR 2.10*   ABG    Component Value Date/Time   PHART 7.335* 10/12/2014 1538   HCO3 36.6* 10/12/2014 1538   TCO2 34 10/13/2014 1615   ACIDBASEDEF 0.8 08/26/2014 2225   O2SAT 87.0 10/14/2014 0354   CBG (last 3)   Recent Labs  10/31/14 1631 10/31/14 2130 11/01/14 0658  GLUCAP 158* 111* 94    Assessment/Plan: S/P Procedure(s) (LRB): VIDEO ASSISTED THORACOSCOPY (VATS)/THOROCOTOMY (Left) DC in am Q hs BIPAP 12/6 pressures   LOS: 52 days    Tharon Aquas Trigt III 11/01/2014

## 2014-11-01 NOTE — Progress Notes (Signed)
Rehab admissions - I met with pt and his wife in follow up to rehab MD consult and explained that rehab MD is recommending home with home health services, as pt is too high level with his mobility. Both pt and his wife were agreeable with this recommendation.  I will now sign off pt's case. Thanks.  Nanetta Batty, PT Rehabilitation Admissions Coordinator [phone]

## 2014-11-01 NOTE — Progress Notes (Signed)
CSW met with patient and  Wife at bedside. Patient and wife reports plans for d/c home tomorrow. Patient spoke about up and down road travelled during this hospitalization and looking forward to d/c home tomorrow. Wife shared she is ready and confident in care of the LVAD and dressing changes.CSW spoke of follow up appointments and opportunities for the support group. Patient verbalized that he has [place] contact numbers and denies any other needs from CSW at the moment. CSW discussed social work availability post hospitalization and will follow up with patient as needed in the [facility]. Patient appears to be motivated for d/c home tomorrow. CSW will be available as needed for d/c and through outpatient clinic. Raquel Sarna, [place]

## 2014-11-01 NOTE — Progress Notes (Signed)
Utilization review completed.  

## 2014-11-01 NOTE — Progress Notes (Signed)
ANTICOAGULATION CONSULT NOTE - Follow Up Consult  Pharmacy Consult for warfarin Indication: LVAD/Hx of PE  No Known Allergies  Patient Measurements: Height: 5\' 8"  (172.7 cm) Weight: 169 lb 6.4 oz (76.839 kg) IBW/kg (Calculated) : 68.4 Heparin Dosing Weight: 78.5kg  Vital Signs: Temp: 97.5 F (36.4 C) (05/02 0526) Temp Source: Oral (05/02 0526) Pulse Rate: 93 (05/02 0526)  Labs:  Recent Labs  10/30/14 0525 10/31/14 0604 11/01/14 0558  HGB 9.0*  --  9.0*  HCT 29.4*  --  29.4*  PLT 329  --  386  LABPROT 19.5* 21.7* 23.7*  INR 1.63* 1.87* 2.10*  CREATININE 0.67  --   --     Estimated Creatinine Clearance: 87.9 mL/min (by C-G formula based on Cr of 0.67).   Medications:  Infusions:  . lactated ringers 20 mL/hr at 10/23/14 2000    Assessment: 56 yoM s/p LVAD on 3/22. Pt w/ history of PE IVC filter on chronic warfarin PTA.   AC: LVAD, Hx PE, IVC filter,  Warfarin/plavix stopped 3/17preop bridge hep/tirofiban, warf restarted 3/23, did not receive dose per PVT request on 4/4 so he could pull pacer wires, all AC d/c'ed 4/5 as pt had to go back to OR for left hemothorax, hematoma, coagulopathy Warfarin resumed 4/8 (goal 1.5-2),  heparin stopped 4/29 Nose bleed overnight 4/20, no further bleeding noted.  After antibiotics stopped required about 5.4 mg daily to get to an INR of 1.5, INR now at goal and continues to trend upward at 2.1 today. At this time expect to d/c patient on 1-2mg  daily, will follow up trend in am.    PTA Warfarin 2mg  MonFri, 4mg  other days  Goal of Therapy:  INR goal 1.5-2  Monitor platelets by anticoagulation protocol: Yes   Plan:  Monitor s/sx of bleeding Warfarin 1.5 mg tonight Daily INR  Thank you for allowing pharmacy to be a part of this patients care team.  Erin Hearing PharmD., BCPS Clinical Pharmacist Pager [phone] 11/01/2014 12:40 PM

## 2014-11-01 NOTE — Progress Notes (Signed)
Patient has ambulate three times with wife. He completes 2 laps around circle each time with rolling walker. He takes a break leaning on counter after each lap. Pulse index did not fall below 4 during walks.  He also sat up at sink for bath.

## 2014-11-01 NOTE — Progress Notes (Signed)
NUTRITION FOLLOW UP  Intervention:   Continue Magic cup TID with meals, each supplement provides 290 kcal and 9 grams of protein RD to follow for nutrition care plan  Nutrition Dx:   Increased nutrient needs related to LVAD as evidenced by estimated energy needs, ongoing  Goal:   Pt to meet >/= 90% of estimated energy needs, met  Monitor:   PO & supplemental intake, weight, labs, I/O's  Assessment:   69 y/o with history of CAD with stenting of LCX 2013, DM2, carotid stenting and ankylosing spondylitis. Presented with an anterolateral MI in 12/15 and was discharged with a life vest and medical therapy. Pt was admitted with worsening heart failure symptoms and has undergone right heart catheterization and is now going to be started on Milrinone and then LVAD.   Prior to intubation, pt was consuming 50-100% of meals on a regular diet. Ensure supplements were ordered, however, pt refused them due to preferring Magic Cups.    Patient s/p procedures 4/6: LEFT THORACOTOMY  EVACUATION OF LEFT CHEST HEMATOMA RE-EXPLORATION FOR BLEEDING  Patient extubated 4/10.    Pt continues on a Heart Healthy diet.  Eating well.  PO intake 75-100% per flowsheet records.  Receiving Magic Cup dessert supplements with meals to maximize kcal, protein intake.   Height: Ht Readings from Last 1 Encounters:  09/30/14 5' 8"  (1.727 m)    Weight Status:   Wt Readings from Last 1 Encounters:  11/01/14 169 lb 6.4 oz (76.839 kg)   09/29/14 184 lb 8.4 oz (83.7 kg)   09/24/14 192 lb 7.4 oz (87.3 kg)            Body mass index is 25.76 kg/(m^2).  Re-estimated needs:  Kcal: 1900-2100 Protein: 125-135 grams Fluid: >2.1 L  Skin: closed lt chest incision, closed rt abdominal incision  Diet Order: Diet Heart Room service appropriate?: Yes; Fluid consistency:: Thin   Intake/Output Summary (Last 24 hours) at 11/01/14 1318 Last data filed at 11/01/14 0900  Gross per 24 hour  Intake    120 ml   Output    445 ml  Net   -325 ml    Labs:   Recent Labs Lab 10/28/14 0414 10/29/14 0530 10/30/14 0525  NA 139 141 141  K 4.4 3.9 3.9  CL 107 106 105  CO2 24 28 30   BUN 7 8 10   CREATININE 0.58 0.68 0.67  CALCIUM 8.1* 8.0* 8.3*  GLUCOSE 93 88 88    CBG (last 3)   Recent Labs  10/31/14 2130 11/01/14 0658 11/01/14 1133  GLUCAP 111* 94 130*    Scheduled Meds: . antiseptic oral rinse  7 mL Mouth Rinse q12n4p  . budesonide-formoterol  2 puff Inhalation BID  . chlorhexidine  15 mL Mouth Rinse BID  . citalopram  20 mg Oral Daily  . dextromethorphan  30 mg Oral BID  . furosemide  20 mg Oral Q48H  . guaiFENesin  600 mg Oral BID  . pantoprazole  40 mg Oral QHS  . sodium chloride  10 mL Intravenous Q12H  . traZODone  50 mg Oral QHS  . warfarin  1.5 mg Oral ONCE-1800  . Warfarin - Pharmacist Dosing Inpatient   Does not apply q1800    Continuous Infusions: . lactated ringers 20 mL/hr at 10/23/14 2000    Arthur Holms, RD, LDN Pager #: [phone] After-Hours Pager #: [phone]

## 2014-11-01 NOTE — Progress Notes (Signed)
Medicare Important Message given? YES  (If response is "NO", the following Medicare IM given date fields will be blank)  Date Medicare IM given: 11/01/14 Medicare IM given by:  Shelma Eiben  

## 2014-11-01 NOTE — Progress Notes (Signed)
RARARDIAC REHAB PHASE I   PRE:  Rate/Rhythm: 96 SR    BP: sitting 90    SaO2: 94 RA  MODE:  Ambulation: 300   POST:  Rate/Rhythm: 110 ST    BP: sitting 88    SaO2: 90 RA then 94 RA  Pt feeling stronger today. Able to walk with rollator independently. Rest x2 due to SOB, leaned on wall. Did not need to sit to rest. Doubled distance, which was one of pts goals. Pt happy with his progress. Pt does not like the rollator, sts its too bulky. Would like a regular RW with a seat, which he sts someone from PT used with him on 2S. I have not seen one of these but I asked CM to look into it. Also maybe PT knows about it. Will f/u. [phone]  Josephina Shih [place] CES, ACSM 11/01/2014 10:09 AM

## 2014-11-01 NOTE — Progress Notes (Signed)
Patient ID: Scott [name], male   DOB: 1946-05-23, 69 y.o.   MRN: [id number] HeartMate 2 Rounding Note  Subjective:    S/P HMII LVAD on 09/20/2013.   Moved to ICU  4/5 due to low MAP and low hemoglobin. Taken Back to the OR 4/5 and 4/6  for recurrent bleed and evacuation of clots from left chest.   Ramp echo 4/22 and speed increased to 9800  VAD speed turned down to 9400 on 4/29 due to probable suction events.   Feels good today. Getting stronger. PI drops to 3.5-4.0 when standing up. No dizziness. Getting lasix every other day. INR 2.1. MAPs 80-90s.    LVAD INTERROGATION:  HeartMate II LVAD:  Flow 5.4  liters/min, speed 9400, power 6.0  PI 6.3 Occasional PI events overnight.   Objective:    Vital Signs:   Temp:  [97.5 F (36.4 C)-98.3 F (36.8 C)] 97.5 F (36.4 C) (05/02 0526) Pulse Rate:  [93-99] 93 (05/02 0526) Resp:  [18-20] 20 (05/01 2306) SpO2:  [97 %] 97 % (05/02 0526) Weight:  [76.839 kg (169 lb 6.4 oz)] 76.839 kg (169 lb 6.4 oz) (05/02 0526) Last BM Date: 10/31/14 Mean arterial Pressure  80-100 Intake/Output:   Intake/Output Summary (Last 24 hours) at 11/01/14 1327 Last data filed at 11/01/14 1325  Gross per 24 hour  Intake    120 ml  Output    745 ml  Net   -625 ml     Physical Exam:  General: Sitting in chair eating HEENT: normal.  Neck: supple. JVP 7 Carotids 2+ bilat; no bruits. No lymphadenopathy or thryomegaly appreciated.  Cor: Mechanical heart sounds with LVAD hum present. Lungs: Decreased breath sounds. On room air.   Abdomen: soft, nontender, nondistended. No hepatosplenomegaly. No bruits or masses. Good bowel sounds. Driveline: Dressing looks good.  Extremities: no cyanosis, clubbing, rash.  No edema RUE PICC Neuro: Awake alert   Telemetry: NSR 90-100   Labs: Basic Metabolic Panel:  Recent Labs Lab 10/26/14 1605 10/27/14 0512 10/28/14 0414 10/29/14 0530 10/30/14 0525  NA 139 138 139 141 141  K 4.2 4.0 4.4 3.9 3.9  CL 106 106  107 106 105  CO2 25 25 24 28 30   GLUCOSE 118* 98 93 88 88  BUN 11 11 7 8 10   CREATININE 0.79 0.63 0.58 0.68 0.67  CALCIUM 8.1* 8.0* 8.1* 8.0* 8.3*    Liver Function Tests: No results for input(s): AST, ALT, ALKPHOS, BILITOT, PROT, ALBUMIN in the last 168 hours. No results for input(s): LIPASE, AMYLASE in the last 168 hours. No results for input(s): AMMONIA in the last 168 hours.  CBC:  Recent Labs Lab 10/27/14 0512 10/28/14 0414 10/29/14 0530 10/30/14 0525 11/01/14 0558  WBC 9.7 9.2 7.9 8.5 11.8*  HGB 9.2* 9.1* 8.9* 9.0* 9.0*  HCT 29.9* 29.8* 29.0* 29.4* 29.4*  MCV 96.8 96.1 96.0 95.1 94.8  PLT 338 320 365 329 386    INR:  Recent Labs Lab 10/28/14 0414 10/29/14 0530 10/30/14 0525 10/31/14 0604 11/01/14 0558  INR 1.35 1.55* 1.63* 1.87* 2.10*    Other results:    Imaging: No results found.   Medications:     Scheduled Medications: . antiseptic oral rinse  7 mL Mouth Rinse q12n4p  . budesonide-formoterol  2 puff Inhalation BID  . chlorhexidine  15 mL Mouth Rinse BID  . citalopram  20 mg Oral Daily  . dextromethorphan  30 mg Oral BID  . furosemide  20 mg Oral Q48H  .  guaiFENesin  600 mg Oral BID  . pantoprazole  40 mg Oral QHS  . sodium chloride  10 mL Intravenous Q12H  . traZODone  50 mg Oral QHS  . warfarin  1.5 mg Oral ONCE-1800  . Warfarin - Pharmacist Dosing Inpatient   Does not apply q1800    Infusions: . lactated ringers 20 mL/hr at 10/23/14 2000    PRN Medications: acetaminophen, fentaNYL (SUBLIMAZE) injection, hydrALAZINE, levalbuterol, ondansetron (ZOFRAN) IV, RESOURCE THICKENUP CLEAR, sodium chloride, sodium chloride, traMADol   Assessment:    1.  A/C systolic HF class IV with cardiogenic shock- EF 15% with moderate RV dysfunction 3/16 echo.         -S/P HMII LVAD 09/21/14  2. CAD s/p Anterior STEMI on 06/12/14 with stenting of LAD-off Plavix.  3. Acute on chronic hypercarbic respiratory failure  4. 06/12/14 VT in setting of  STEMI 5. Ankylosing spondylitis. 6. Bilateral PE with R DVT- S/P IVC filter 08/27/2014--->coumadin 7. Acute Blood Loss into pleural space:  Back to OR 4/5 and 4/6 for recurrent bleed. Received multiple blood products.  8. PAF: On amiodarone.  9. Nose Bleed.-  10. Hypokalemia  Plan/Discussion:    S/P 09/21/2014 HMII LVAD. Back to OR 4/5 and and again 4/6 for recurrent bleed and evacuation clots. Received multiple blood products.   INR today 2.1. Coumadin dosing per pharmacy. INR goal - 1.5-2.0   MAPs good. VAD speed turned down 3 days ago due to suction events. Lasix every other day. Weight stable. PI drops to low 4s now when standing but no bradycardia or dizziness. Will continue to watch. May need to go to 9200.  Seen by CIR and felt to be doing to well.   Plan d/c in am on current regimen if remains stable. D/w Dr. [name] personally.  I reviewed the LVAD parameters from today, and compared the results to the patient's prior recorded data.  No programming changes were made.  The LVAD is functioning within specified parameters.  The patient performs LVAD self-test daily.  LVAD interrogation was negative for any significant power changes, alarms or PI events/speed drops.  LVAD equipment check completed and is in good working order.  Back-up equipment present.   LVAD education done on emergency procedures and precautions and reviewed exit site care.   Scott [name], [name] MD  1:27 PM   Length of Stay: 40 VAD Team --- VAD ISSUES ONLY--- Pager [phone] (7am - 7am) Advanced Heart Failure Team  Pager [phone] (M-F; 7a - 4p)  Please contact [facility] for night-coverage after hours (4p -7a ) and weekends on [email or web address]

## 2014-11-01 NOTE — Progress Notes (Signed)
Admitted 09/10/14 with worsening heart failure and cardiogenic shock.  HeartMate II LVAD implated on 09/21/14 by Dr. Darcey Nora for DT VAD.    LVAD interrogation reveals:  Speed:  9400 Flow: 5.9 Power: 5.7 PI: 6.2 Alarms: none Events: 1 PI today (2.3), 2 yesterday (3.5) Fixed speed:  9400 Low speed limit: 8800  Blood products: OR:   09/21/14:  1 unit PC, 2 plts, 3 FFP 10/05/14:  3 PC's, 500 cell saver 10/06/14:  2 PCs, 2 cryo, 300 cell saver  ICU:  09/22/14:   1 unit PC 10/05/14:  2 PCs, 2 FFP 10/06/14:  5 PCs, 2 FFP, 1 Plt, 1 cryo, 3 Factor VII, DDAVP  Nitric Oxide: off  10/11/14  ASA 81 mg:  Last dose (81 mg) --HELD  Warfarin:  INR goal 1.5 - 2.0  Labs:  LDH trend: 301> ... >469 (peak)> ... > 399  INR trend:1.63 > 1.87 > 2.10  WBC:  20.6>22.9>... > 7.9 > 8.5 > 11.8  Hgb: 8.8> ... > 8.9 > 9.0 > 9.0   LVAD Exit Site:  Maintained by wife with support of nursing staff daily. Anticipate D/C home tomorrow. Patient has adequate dressing supplies. Site improving and healing; will continue to follow in VAD clinic.    Plan/Recommendations and Discharge Needs as discussed with VAD team:  1. Ambulating several laps at a time now with some brief standing breaks. Doing well staying motivated to recondition himself.   2. Home Health arranged with necessary equipment for upcoming D/C home. Will be doing wound care to old chest tube sites with wet-to-dry dressings per Dr. [name]. Will follow closely in the clinic after D/C home with weekly appointments. Discussed S/S of infection with wife and when to call.   3. Discussed PI events with wife, what those numbers mean, what she should be looking for at home and when to call the VAD pager. Verbalized understanding.   4. Appointment set for Thursday 11/11/14 @ 11:00 for first F/U appointment in [place] [facility].

## 2014-11-01 NOTE — Progress Notes (Signed)
PT Cancellation Note  Patient Details Name: Scott [name] MRN: [id number] DOB: 1946-02-27   Cancelled Treatment:    Reason Eval/Treat Not Completed: Patient at procedure or test/unavailable. Pt up walking in hallway with wife. Utilizing 2 wheel walker. Noted likely home tomorrow.   Pt does not want a 4 wheeled walker (they feel it is too cumbersome to get into/out of their car).  He does want a 2 wheeled walker with seat that folds up. (it is a regular, folding, 2 wheeled walker with seat)   Scott [name] 11/01/2014, 4:38 PM Pager [phone]

## 2014-11-02 LAB — PROTIME-INR
INR: 2.05 — ABNORMAL HIGH (ref 0.00–1.49)
PROTHROMBIN TIME: 23.3 s — AB (ref 11.6–15.2)

## 2014-11-02 LAB — BASIC METABOLIC PANEL
Anion gap: 7 (ref 5–15)
BUN: 10 mg/dL (ref 6–20)
CO2: 31 mmol/L (ref 22–32)
Calcium: 8.1 mg/dL — ABNORMAL LOW (ref 8.9–10.3)
Chloride: 103 mmol/L (ref 101–111)
Creatinine, Ser: 0.6 mg/dL — ABNORMAL LOW (ref 0.61–1.24)
GFR calc Af Amer: >60 mL/min (ref >60)
GFR calc non Af Amer: >60 mL/min (ref >60)
Glucose, Bld: 97 mg/dL (ref 70–99)
Potassium: 3.6 mmol/L (ref 3.5–5.1)
Sodium: 141 mmol/L (ref 135–145)

## 2014-11-02 LAB — GLUCOSE, CAPILLARY
GLUCOSE-CAPILLARY: 121 mg/dL — AB (ref 70–99)
Glucose-Capillary: 110 mg/dL — ABNORMAL HIGH (ref 70–99)

## 2014-11-02 MED ORDER — TRAZODONE HCL 50 MG PO TABS
50.0000 mg | ORAL_TABLET | Freq: Every day | ORAL | Status: DC
Start: 2014-11-02 — End: 2014-12-02

## 2014-11-02 MED ORDER — ZOLPIDEM TARTRATE 10 MG PO TABS: 10.0000 mg | ORAL_TABLET | Freq: Every evening | ORAL | Status: DC | PRN

## 2014-11-02 MED ORDER — PANTOPRAZOLE SODIUM 40 MG PO TBEC: 40.0000 mg | DELAYED_RELEASE_TABLET | Freq: Every day | ORAL | Status: DC

## 2014-11-02 MED ORDER — WARFARIN SODIUM 1 MG PO TABS: 1.5000 mg | ORAL_TABLET | Freq: Every day | ORAL | Status: DC

## 2014-11-02 MED ORDER — FUROSEMIDE 20 MG PO TABS: 20.0000 mg | ORAL_TABLET | ORAL | Status: DC

## 2014-11-02 MED ORDER — WARFARIN SODIUM 1 MG PO TABS
1.5000 mg | ORAL_TABLET | Freq: Every day | ORAL | Status: DC
  Filled 2014-11-02: qty 1

## 2014-11-02 MED ORDER — METAXALONE 800 MG PO TABS: 800.0000 mg | ORAL_TABLET | Freq: Every day | ORAL | Status: DC | PRN

## 2014-11-02 MED ORDER — BUDESONIDE-FORMOTEROL FUMARATE 160-4.5 MCG/ACT IN AERO: 2.0000 | INHALATION_SPRAY | Freq: Two times a day (BID) | RESPIRATORY_TRACT | Status: DC

## 2014-11-02 MED ORDER — CITALOPRAM HYDROBROMIDE 20 MG PO TABS: 20.0000 mg | ORAL_TABLET | Freq: Every day | ORAL | Status: DC

## 2014-11-02 NOTE — Discharge Summary (Signed)
Advanced Heart Failure Team  Discharge Summary   Patient ID: Scott [name] MRN: [id number], DOB/AGE: [birth year] 69 y.o. Admit date: 09/10/2014 D/C date:     11/02/2014   Primary Discharge Diagnoses:  1. A/C systolic HF class IV with cardiogenic shock- EF 15% with moderate RV dysfunction 3/16 echo.   -S/P HMII LVAD 09/21/14  2. CAD s/p Anterior STEMI on 06/12/14 with stenting of LAD-off Plavix.  3. Acute on chronic hypercarbic respiratory failure  4. 06/12/14 VT in setting of STEMI 5. Ankylosing spondylitis. 6. Bilateral PE with R DVT- S/P IVC filter 08/27/2014--->coumadin 7. Acute Blood Loss into pleural space: Back to OR 4/5 and 4/6 for recurrent bleed. Received multiple blood products.  8. PAF: On amiodarone.  9. Nose Bleed.-  10. Hypokalemia 11. Anticoagulation- Goal for INR 1.5-2.0  12. R Pleural Effusion- S/P Thoracentesis 09/16/2014    Hospital Course:  Scott [name] is a 69 y/o with h/o CAD with stenting of LCX 2013, DM2, carotid stenting and ankylosing spondylitis.. Admitted 06/1214 with anterior STEMI with occluded LAD and post-cath developed shock and IABP placed. This was complicated by VT and respiratory failue.He also required IABP and was eventually discharged home.    Admitted in 2/16 with large PE and pulmonary infarct. Anticoagulated and IVC filter placed.   He was admitted March 11th from the HF clinic with increased fatigue, dyspnea, and lower extremity edema. He was taken to the cath lab for swan placement and this revealed cardiogenic shock so milrinone was started and he was diuresed with IV lasix.  He initially improved but again declined while on milrinone with mixed venous saturation 38% and worsening renal function so levophed was added. He had increased dyspnea and CXR showed moderate effusion and critical care consulted and he had thoracentesis on the right. He continued to decline despite dual pressors and required IABP. At that point it was  clear he would need advanced therapies.  Dr Lawson Fiscal, [place] team, and palliative care consulted for potential VAD. After the completion of VAD work up and discussion at [facility] as well as presentation to the [facility] transplant listing conference he was approved for LVAD placement for DT.    On March 22 he underwent HMII LVAD for DT. He had immediate post-op bleeding and returned to OR for control.   He then did well and was extubated with all drips weaned off and moved out of the ICU. Unfortunately on April 5th he developed increased dyspnea and hypotension with MAPs in the 50s. He was urgently transferred to ICU. CXR showed large pleural effusion. This required return to the OR for bleeding on April 5th and April 6th and evacuation of numberous clots. He required multiple blood products.  As he improved he was extubated and all drips slowly weaned off and he was transferred out of the ICU.    After his second return to the OR his WBC started trending up and he was placed on broad spectrum antibiotics- vancomycin, meropenem, and fluconazole. He had no clear source of infection ( blood and urine cultures negative). WBC peaked at 26,000. ID consulted with antibiotics narrowed and later stopped as WBC started to trend down.    He also had problems with severe CO2 retention and hypercapnic respirtory failure due to restrictive physiology from his ankylosing spondylitis. He was treated with bipap and then weaned to bipap at night only.   He was carefully placed on Heparin and Coumadin per Dr Georgie Chard. Heparin was stopped after INR was  greater than 1.5 and he continued on coumadin. INR goal for coumadin is 1.5- 2.0. He was not placed on aspirin with recurrent bleeding. He will continue on protonix.  From HF perspective he was carefully diuresed with adjustments made to LVAD speed due to orthostasis and low PI numbers and suspected suction events.  He is being discharged on lasix 20 mg every other day.  At the  time of discharge LVAD speed was dropped to 9200 and with plans for LVAD interrogation next week.     He received extensive LVAD education and his wife will be his primary care giver. [facility] will follow for HHRN and HHPT. He will also continue BiPap nightly. He will follow up next Monday in the LVAD clinic.   LVAD INTERROGATION:  HeartMate II LVAD: Flow 5.1 liters/min, speed 9200, power 6.0 PI 6.8 1 PI over night. PI down to 2.0 at that time.      Procedure   09/10/2014 Swan Placement  RA = 16 RV = 61/11/21  PA = 61/25 (35) PCW = 23 Fick cardiac output/index = 3.7/1.8 PVR = 2.5 WU Ao sat = 89% PA sat = 38%, 38%   Discharge Weight Range: 169 pounds.  Discharge Vitals: Blood pressure 91/66, pulse 93, temperature 97.8 F (36.6 C), temperature source Oral, resp. rate 16, height 5\' 8"  (1.727 m), weight 169 lb 1.5 oz (76.7 kg), SpO2 94 %.  Labs: Lab Results  Component Value Date   WBC 11.8* 11/01/2014   HGB 9.0* 11/01/2014   HCT 29.4* 11/01/2014   MCV 94.8 11/01/2014   PLT 386 11/01/2014    Recent Labs Lab 11/02/14 0345  NA 141  K 3.6  CL 103  CO2 31  BUN 10  CREATININE 0.60*  CALCIUM 8.1*  GLUCOSE 97   Lab Results  Component Value Date   CHOL 132 09/16/2014   HDL 51 09/16/2014   LDLCALC 72 09/16/2014   TRIG 64 09/25/2014   BNP (last 3 results)  Recent Labs  09/10/14 1000 09/22/14 0300 09/28/14 0530  BNP 2173.4* 1237.0* 845.8*    ProBNP (last 3 results) No results for input(s): PROBNP in the last 8760 hours.   Diagnostic Studies/Procedures   No results found.  Discharge Medications     Medication List    TAKE these medications        budesonide-formoterol 160-4.5 MCG/ACT inhaler  Commonly known as:  SYMBICORT  Inhale 2 puffs into the lungs 2 (two) times daily.     citalopram 20 MG tablet  Commonly known as:  CELEXA  Take 1 tablet (20 mg total) by mouth daily.     furosemide 20 MG tablet  Commonly known as:  LASIX  Take 1 tablet (20 mg  total) by mouth every other day.     metaxalone 800 MG tablet  Commonly known as:  SKELAXIN  Take 1 tablet (800 mg total) by mouth daily as needed for muscle spasms.     pantoprazole 40 MG tablet  Commonly known as:  PROTONIX  Take 1 tablet (40 mg total) by mouth at bedtime.     rosuvastatin 10 MG tablet  Commonly known as:  CRESTOR  Take 10 mg by mouth daily.     traZODone 50 MG tablet  Commonly known as:  DESYREL  Take 1 tablet (50 mg total) by mouth at bedtime.     warfarin 1 MG tablet  Commonly known as:  COUMADIN  Take 1.5 tablets (1.5 mg total) by mouth daily at  6 PM.     zolpidem 10 MG tablet  Commonly known as:  AMBIEN  Take 1 tablet (10 mg total) by mouth at bedtime as needed for sleep.        Disposition   The patient will be discharged in stable condition to home.     Discharge Instructions    Amb Referral to Cardiac Rehabilitation    Complete by:  As directed   Congestive Heart Failure: If diagnosis is Heart Failure, patient MUST meet each of the CMS criteria: 1. Left Ventricular Ejection Fraction </= 35% 2. NYHA class II-IV symptoms despite being on optimal heart failure therapy for at least 6 weeks. 3. Stable = have not had a recent (<6 weeks) or planned (<6 months) major cardiovascular hospitalization or procedure  Program Details: - Physician supervised classes - 1-3 classes per week over a 12-18 week period, generally for a total of 36 sessions  Physician Certification: I certify that the above Cardiac Rehabilitation treatment is medically necessary and is medically approved by me for treatment of this patient. The patient is willing and cooperative, able to ambulate and medically stable to participate in exercise rehabilitation. The participant's progress and Individualized Treatment Plan will be reviewed by the Medical Director, Cardiac Rehab staff and as indicated by the Referring/Ordering Physician.  Diagnosis:  Other     Contraindication to ACEI  at discharge    Complete by:  As directed      Diet - low sodium heart healthy    Complete by:  As directed      Heart Failure patients record your daily weight using the same scale at the same time of day    Complete by:  As directed      Increase activity slowly    Complete by:  As directed           Follow-up Information    Follow up with [facility].   Why:  home BIPAP arranged  (RW and 3n1 to be delivered to room prior to discharge)   Contact information:   [address] [place]  [address] [phone]       Follow up with [facility].   Why:  HH-RN/PT/OT arranged (INR checks and drsg changes)   Contact information:   [place] [address] [phone]       Follow up with [name], MD On 11/08/2014.   Specialty:  Cardiology   Why:  LVAD 9:00   Contact information:   [address] [place] [address] [phone]         Duration of Discharge Encounter: Greater than 35 minutes   Signed, CLEGG,AMY NP-C   11/02/2014, 11:41 AM   Patient seen and examined with Darrick Grinder, NP. We discussed all aspects of the encounter. I agree with the assessment and plan as stated above. He is much improved and ready for d/c today. F/u in VAD clinic net week.   Nakeita Styles, [place],MD 7:48 PM

## 2014-11-02 NOTE — Progress Notes (Signed)
NT obtained standing weight this am. Pt felt lightheaded and needed to sit down per NT. Pt's RN in another room. Other RN assessed and pt regained strength and reported feeling better. RN assessed history on VAD screen and it showed PI event where the PI dropped to 2.0. Pt reports feeling very weak, but returning back to normal with rest. Pt MAP this am 78. Pt resting in bed lying down with call bell within reach.  Will continue to monitor closely.   Scott [name]

## 2014-11-02 NOTE — Progress Notes (Signed)
[id number] Education completed with pt and wife re walking, IS, flutter valve, and watching sodium in diet. Encouraged pt to increase distance as he feels up to it. Pt gave permission to refer to [facility] as he may consider program at later time. Reviewed sternal precautions. Pt stated did not need diet info as he has been watching sodium. Graylon Good RN BSN 11/02/2014 11:06 AM

## 2014-11-02 NOTE — Progress Notes (Signed)
ANTICOAGULATION CONSULT NOTE - Follow Up Consult  Pharmacy Consult for warfarin Indication: LVAD/Hx of PE  No Known Allergies  Patient Measurements: Height: 5\' 8"  (172.7 cm) Weight: 169 lb 1.5 oz (76.7 kg) IBW/kg (Calculated) : 68.4 Heparin Dosing Weight: 78.5kg  Vital Signs: Temp: 97.8 F (36.6 C) (05/03 0424) Temp Source: Oral (05/03 0424) Pulse Rate: 93 (05/03 0424)  Labs:  Recent Labs  10/31/14 0604 11/01/14 0558 11/02/14 0345  HGB  --  9.0*  --   HCT  --  29.4*  --   PLT  --  386  --   LABPROT 21.7* 23.7* 23.3*  INR 1.87* 2.10* 2.05*  CREATININE  --   --  0.60*    Estimated Creatinine Clearance: 87.9 mL/min (by C-G formula based on Cr of 0.6).   Medications:  Infusions:  . lactated ringers 20 mL/hr at 10/23/14 2000    Assessment: 56 yoM s/p LVAD on 3/22. Pt w/ history of PE IVC filter on chronic warfarin PTA.   AC: LVAD, Hx PE, IVC filter,  Warfarin/plavix stopped 3/17preop bridge hep/tirofiban, warf restarted 3/23, did not receive dose per PVT request on 4/4 so he could pull pacer wires, all AC d/c'ed 4/5 as pt had to go back to OR for left hemothorax, hematoma, coagulopathy Warfarin resumed 4/8 (goal 1.5-2),  heparin stopped 4/29 Nose bleed overnight 4/20, no further bleeding noted.  After antibiotics stopped required about 5.4 mg daily to get to an INR of 1.5, INR now at goal at 2 today. At this time expect to d/c patient on 1-2mg  daily, will follow up trend in am.    Would consider sending patient home on 1.5mg  daily with INR follow up early next week. Consider 1mg  tablets at d/c.  PTA Warfarin 2mg  MonFri, 4mg  other days  Goal of Therapy:  INR goal 1.5-2  Monitor platelets by anticoagulation protocol: Yes   Plan:  Monitor s/sx of bleeding Warfarin 1.5 mg tonight Daily INR  Thank you for allowing pharmacy to be a part of this patients care team.  Erin Hearing PharmD., BCPS Clinical Pharmacist Pager [phone] 11/02/2014 9:10 AM

## 2014-11-02 NOTE — Progress Notes (Signed)
VAD drive-line dressing changed per wife. Discharge teaching done with pt and wife. Questions answered. Pt discharged home with wife on VAD battery power. VAD wall unit sent with patient. Pt has emergency VAD backup controller and extra batteries.

## 2014-11-02 NOTE — Progress Notes (Signed)
VAD backup controller updated to new speed. Left chest tube site dressing change done with moist to dry saline packing. Scant serous drainage on old dressing.

## 2014-11-02 NOTE — Progress Notes (Signed)
Patient ID: Scott [name], male   DOB: 1945-09-29, 69 y.o.   MRN: [id number] HeartMate 2 Rounding Note  Subjective:    S/P HMII LVAD on 09/20/2013.   Moved to ICU  4/5 due to low MAP and low hemoglobin. Taken Back to the OR 4/5 and 4/6  for recurrent bleed and evacuation of clots from left chest.   Ramp echo 4/22 and speed increased to 9800  VAD speed turned down to 9400 on 4/29 due to probable suction events. Speed turned down this morning by [place].     Had one PI over night  With PI down to 2.0. No  dizziness. Getting lasix every other day. INR 2.0. MAPs 80-90s.    LVAD INTERROGATION:  HeartMate II LVAD:  Flow 5.1  liters/min, speed 9200, power 6.0  PI 6.8  1 PI over night. PI down to 2.0 at that time.    Objective:    Vital Signs:   Temp:  [97.3 F (36.3 C)-98.5 F (36.9 C)] 97.8 F (36.6 C) (05/03 0424) Pulse Rate:  [88-97] 93 (05/03 0424) Resp:  [16-20] 16 (05/03 0424) SpO2:  [93 %-96 %] 93 % (05/03 0424) Weight:  [169 lb 1.5 oz (76.7 kg)] 169 lb 1.5 oz (76.7 kg) (05/03 0424) Last BM Date: 11/01/14 Mean arterial Pressure  80-100 Intake/Output:   Intake/Output Summary (Last 24 hours) at 11/02/14 0903 Last data filed at 11/02/14 0902  Gross per 24 hour  Intake    260 ml  Output   1300 ml  Net  -1040 ml     Physical Exam:  General: Walking in room NAD  HEENT: normal.  Neck: supple. JVP 7 Carotids 2+ bilat; no bruits. No lymphadenopathy or thryomegaly appreciated.  Cor: Mechanical heart sounds with LVAD hum present. Lungs: Decreased breath sounds. On room air.   Abdomen: soft, nontender, nondistended. No hepatosplenomegaly. No bruits or masses. Good bowel sounds. Driveline: Dressing looks good.  Extremities: no cyanosis, clubbing, rash.  No edema RUE PICC Neuro: Awake alert   Telemetry: NSR 90-100   Labs: Basic Metabolic Panel:  Recent Labs Lab 10/27/14 0512 10/28/14 0414 10/29/14 0530 10/30/14 0525 11/02/14 0345  NA 138 139 141 141 141  K  4.0 4.4 3.9 3.9 3.6  CL 106 107 106 105 103  CO2 25 24 28 30 31   GLUCOSE 98 93 88 88 97  BUN 11 7 8 10 10   CREATININE 0.63 0.58 0.68 0.67 0.60*  CALCIUM 8.0* 8.1* 8.0* 8.3* 8.1*    Liver Function Tests: No results for input(s): AST, ALT, ALKPHOS, BILITOT, PROT, ALBUMIN in the last 168 hours. No results for input(s): LIPASE, AMYLASE in the last 168 hours. No results for input(s): AMMONIA in the last 168 hours.  CBC:  Recent Labs Lab 10/27/14 0512 10/28/14 0414 10/29/14 0530 10/30/14 0525 11/01/14 0558  WBC 9.7 9.2 7.9 8.5 11.8*  HGB 9.2* 9.1* 8.9* 9.0* 9.0*  HCT 29.9* 29.8* 29.0* 29.4* 29.4*  MCV 96.8 96.1 96.0 95.1 94.8  PLT 338 320 365 329 386    INR:  Recent Labs Lab 10/29/14 0530 10/30/14 0525 10/31/14 0604 11/01/14 0558 11/02/14 0345  INR 1.55* 1.63* 1.87* 2.10* 2.05*    Other results:    Imaging: No results found.   Medications:     Scheduled Medications: . antiseptic oral rinse  7 mL Mouth Rinse q12n4p  . budesonide-formoterol  2 puff Inhalation BID  . chlorhexidine  15 mL Mouth Rinse BID  . citalopram  20 mg  Oral Daily  . dextromethorphan  30 mg Oral BID  . furosemide  20 mg Oral Q48H  . guaiFENesin  600 mg Oral BID  . pantoprazole  40 mg Oral QHS  . sodium chloride  10 mL Intravenous Q12H  . traZODone  50 mg Oral QHS  . Warfarin - Pharmacist Dosing Inpatient   Does not apply q1800    Infusions: . lactated ringers 20 mL/hr at 10/23/14 2000    PRN Medications: acetaminophen, fentaNYL (SUBLIMAZE) injection, hydrALAZINE, levalbuterol, ondansetron (ZOFRAN) IV, RESOURCE THICKENUP CLEAR, sodium chloride, sodium chloride, traMADol   Assessment:    1.  A/C systolic HF class IV with cardiogenic shock- EF 15% with moderate RV dysfunction 3/16 echo.         -S/P HMII LVAD 09/21/14  2. CAD s/p Anterior STEMI on 06/12/14 with stenting of LAD-off Plavix.  3. Acute on chronic hypercarbic respiratory failure  4. 06/12/14 VT in setting of  STEMI 5. Ankylosing spondylitis. 6. Bilateral PE with R DVT- S/P IVC filter 08/27/2014--->coumadin 7. Acute Blood Loss into pleural space:  Back to OR 4/5 and 4/6 for recurrent bleed. Received multiple blood products.  8. PAF: On amiodarone.  9. Nose Bleed.-  10. Hypokalemia  Plan/Discussion:    S/P 09/21/2014 HMII LVAD. Back to OR 4/5 and and again 4/6 for recurrent bleed and evacuation clots. Received multiple blood products.   INR today 2.0 . Coumadin dosing per pharmacy. INR goal - 1.5-2.0   MAPs good. VAD speed turned down 3 days ago due to suction events. This am Dr Darcey Nora turned him down to 9200. Lasix every other day. Weight stable. Had 1 PI over night with PI down to 2.0 when he stood for the morning weight. .   Plan d/c today.    I reviewed the LVAD parameters from today, and compared the results to the patient's prior recorded data.  No programming changes were made.  The LVAD is functioning within specified parameters.  The patient performs LVAD self-test daily.  LVAD interrogation was negative for any significant power changes, alarms or PI events/speed drops.  LVAD equipment check completed and is in good working order.  Back-up equipment present.   LVAD education done on emergency procedures and precautions and reviewed exit site care.   CLEGG,AMY NP-C  9:03 AM   Length of Stay: 59 VAD Team --- VAD ISSUES ONLY--- Pager [phone] (7am - 7am) Advanced Heart Failure Team  Pager [phone] (M-F; 7a - 4p)  Please contact [facility] for night-coverage after hours (4p -7a ) and weekends on [email or web address]    Patient seen and examined with Darrick Grinder, NP. We discussed all aspects of the encounter. I agree with the assessment and plan as stated above.   Doing well. Ready for d/c home today. Agree with turning speed down to 9200. Continue lasix every other day. Will see in VAD clinic next week. Ok to use skelaxin for ankylosing spondylitis. Avoid NSAIDs or sulfasalazine.    Bensimhon, Lucian,MD 11:15 AM

## 2014-11-03 ENCOUNTER — Telehealth: Payer: Self-pay | Admitting: *Deleted

## 2014-11-03 ENCOUNTER — Telehealth (HOSPITAL_COMMUNITY): Payer: Self-pay | Admitting: *Deleted

## 2014-11-03 DIAGNOSIS — I5021 Acute systolic (congestive) heart failure: Secondary | ICD-10-CM | POA: Diagnosis not present

## 2014-11-03 DIAGNOSIS — Z7901 Long term (current) use of anticoagulants: Secondary | ICD-10-CM | POA: Diagnosis not present

## 2014-11-03 DIAGNOSIS — I255 Ischemic cardiomyopathy: Secondary | ICD-10-CM | POA: Diagnosis not present

## 2014-11-03 DIAGNOSIS — I10 Essential (primary) hypertension: Secondary | ICD-10-CM | POA: Diagnosis not present

## 2014-11-03 DIAGNOSIS — J9620 Acute and chronic respiratory failure, unspecified whether with hypoxia or hypercapnia: Secondary | ICD-10-CM | POA: Diagnosis not present

## 2014-11-03 DIAGNOSIS — I2510 Atherosclerotic heart disease of native coronary artery without angina pectoris: Secondary | ICD-10-CM | POA: Diagnosis not present

## 2014-11-03 DIAGNOSIS — Z95810 Presence of automatic (implantable) cardiac defibrillator: Secondary | ICD-10-CM | POA: Diagnosis not present

## 2014-11-03 DIAGNOSIS — M459 Ankylosing spondylitis of unspecified sites in spine: Secondary | ICD-10-CM | POA: Diagnosis not present

## 2014-11-03 DIAGNOSIS — Z87891 Personal history of nicotine dependence: Secondary | ICD-10-CM | POA: Diagnosis not present

## 2014-11-03 DIAGNOSIS — E119 Type 2 diabetes mellitus without complications: Secondary | ICD-10-CM | POA: Diagnosis not present

## 2014-11-03 DIAGNOSIS — Z438 Encounter for attention to other artificial openings: Secondary | ICD-10-CM | POA: Diagnosis not present

## 2014-11-03 DIAGNOSIS — I2699 Other pulmonary embolism without acute cor pulmonale: Secondary | ICD-10-CM | POA: Diagnosis not present

## 2014-11-03 DIAGNOSIS — I82401 Acute embolism and thrombosis of unspecified deep veins of right lower extremity: Secondary | ICD-10-CM | POA: Diagnosis not present

## 2014-11-03 DIAGNOSIS — Z5181 Encounter for therapeutic drug level monitoring: Secondary | ICD-10-CM | POA: Diagnosis not present

## 2014-11-03 DIAGNOSIS — I480 Paroxysmal atrial fibrillation: Secondary | ICD-10-CM | POA: Diagnosis not present

## 2014-11-03 LAB — AFB CULTURE WITH SMEAR (NOT AT ARMC): Acid Fast Smear: NONE SEEN

## 2014-11-03 LAB — PROTIME-INR: INR: 2.2 — AB (ref <=1.1)

## 2014-11-03 NOTE — Telephone Encounter (Signed)
RN with [facility] reported pt inr results. PT 26.9 INR 2.2 current dose of coumadin is 1.5mg  daily  Callback number [phone]

## 2014-11-03 NOTE — Telephone Encounter (Signed)
Send INR results to VAD coordinators

## 2014-11-03 NOTE — Telephone Encounter (Signed)
See anticoagulation flow sheet.

## 2014-11-03 NOTE — Telephone Encounter (Signed)
Scott [name] has an open wound and has Home Health tending to the wound.  Will recheck in 3 weeks to see if wound is healed.

## 2014-11-03 NOTE — Telephone Encounter (Signed)
RN with [facility] reported pt inr results. PT 26.9 INR 2.2 current dose of coumadin is 1.5mg  daily  Callback number [phone]

## 2014-11-04 ENCOUNTER — Ambulatory Visit (HOSPITAL_COMMUNITY): Payer: Self-pay | Admitting: Infectious Diseases

## 2014-11-04 DIAGNOSIS — Z5181 Encounter for therapeutic drug level monitoring: Secondary | ICD-10-CM

## 2014-11-04 DIAGNOSIS — I2699 Other pulmonary embolism without acute cor pulmonale: Secondary | ICD-10-CM

## 2014-11-04 DIAGNOSIS — Z95811 Presence of heart assist device: Secondary | ICD-10-CM

## 2014-11-05 ENCOUNTER — Telehealth: Payer: Self-pay | Admitting: *Deleted

## 2014-11-05 DIAGNOSIS — I2699 Other pulmonary embolism without acute cor pulmonale: Secondary | ICD-10-CM | POA: Diagnosis not present

## 2014-11-05 DIAGNOSIS — M459 Ankylosing spondylitis of unspecified sites in spine: Secondary | ICD-10-CM | POA: Diagnosis not present

## 2014-11-05 DIAGNOSIS — I255 Ischemic cardiomyopathy: Secondary | ICD-10-CM | POA: Diagnosis not present

## 2014-11-05 DIAGNOSIS — I2510 Atherosclerotic heart disease of native coronary artery without angina pectoris: Secondary | ICD-10-CM | POA: Diagnosis not present

## 2014-11-05 DIAGNOSIS — I5021 Acute systolic (congestive) heart failure: Secondary | ICD-10-CM | POA: Diagnosis not present

## 2014-11-05 DIAGNOSIS — Z438 Encounter for attention to other artificial openings: Secondary | ICD-10-CM | POA: Diagnosis not present

## 2014-11-05 NOTE — Telephone Encounter (Signed)
Spoke with Scott [name] and his wife.  He has an open wound and has Home Health for 9 weeks.  We will touch base again in 6-7 weeks to see how the wound is healing and for expected Home Health discharge date.

## 2014-11-06 DIAGNOSIS — Z438 Encounter for attention to other artificial openings: Secondary | ICD-10-CM | POA: Diagnosis not present

## 2014-11-06 DIAGNOSIS — I2699 Other pulmonary embolism without acute cor pulmonale: Secondary | ICD-10-CM | POA: Diagnosis not present

## 2014-11-06 DIAGNOSIS — I2510 Atherosclerotic heart disease of native coronary artery without angina pectoris: Secondary | ICD-10-CM | POA: Diagnosis not present

## 2014-11-06 DIAGNOSIS — M459 Ankylosing spondylitis of unspecified sites in spine: Secondary | ICD-10-CM | POA: Diagnosis not present

## 2014-11-06 DIAGNOSIS — I5021 Acute systolic (congestive) heart failure: Secondary | ICD-10-CM | POA: Diagnosis not present

## 2014-11-06 DIAGNOSIS — I255 Ischemic cardiomyopathy: Secondary | ICD-10-CM | POA: Diagnosis not present

## 2014-11-08 ENCOUNTER — Encounter (HOSPITAL_COMMUNITY): Payer: Self-pay | Admitting: Infectious Diseases

## 2014-11-08 ENCOUNTER — Other Ambulatory Visit (HOSPITAL_COMMUNITY): Payer: Self-pay | Admitting: *Deleted

## 2014-11-08 ENCOUNTER — Ambulatory Visit (HOSPITAL_COMMUNITY)
Admission: RE | Admit: 2014-11-08 | Discharge: 2014-11-08 | Disposition: A | Discharge status: Home / Self Care | Payer: Medicare Other | Source: Ambulatory Visit | Attending: Internal Medicine | Admitting: Internal Medicine

## 2014-11-08 ENCOUNTER — Encounter: Payer: Self-pay | Admitting: Infectious Diseases

## 2014-11-08 ENCOUNTER — Ambulatory Visit (HOSPITAL_COMMUNITY): Payer: Self-pay | Admitting: *Deleted

## 2014-11-08 ENCOUNTER — Telehealth (HOSPITAL_COMMUNITY): Payer: Self-pay | Admitting: *Deleted

## 2014-11-08 DIAGNOSIS — I951 Orthostatic hypotension: Secondary | ICD-10-CM | POA: Diagnosis not present

## 2014-11-08 DIAGNOSIS — R0600 Dyspnea, unspecified: Secondary | ICD-10-CM | POA: Diagnosis not present

## 2014-11-08 DIAGNOSIS — Z95811 Presence of heart assist device: Secondary | ICD-10-CM

## 2014-11-08 DIAGNOSIS — I5022 Chronic systolic (congestive) heart failure: Secondary | ICD-10-CM | POA: Insufficient documentation

## 2014-11-08 DIAGNOSIS — Z5181 Encounter for therapeutic drug level monitoring: Secondary | ICD-10-CM

## 2014-11-08 DIAGNOSIS — Z95810 Presence of automatic (implantable) cardiac defibrillator: Secondary | ICD-10-CM | POA: Diagnosis not present

## 2014-11-08 DIAGNOSIS — M459 Ankylosing spondylitis of unspecified sites in spine: Secondary | ICD-10-CM | POA: Insufficient documentation

## 2014-11-08 DIAGNOSIS — E785 Hyperlipidemia, unspecified: Secondary | ICD-10-CM | POA: Diagnosis not present

## 2014-11-08 DIAGNOSIS — J449 Chronic obstructive pulmonary disease, unspecified: Secondary | ICD-10-CM | POA: Insufficient documentation

## 2014-11-08 DIAGNOSIS — I2510 Atherosclerotic heart disease of native coronary artery without angina pectoris: Secondary | ICD-10-CM | POA: Insufficient documentation

## 2014-11-08 DIAGNOSIS — Z79899 Other long term (current) drug therapy: Secondary | ICD-10-CM | POA: Insufficient documentation

## 2014-11-08 DIAGNOSIS — Z7901 Long term (current) use of anticoagulants: Secondary | ICD-10-CM | POA: Insufficient documentation

## 2014-11-08 LAB — PROTIME-INR
INR: 1.14 (ref 0.00–1.49)
Prothrombin Time: 14.7 seconds (ref 11.6–15.2)

## 2014-11-08 LAB — BASIC METABOLIC PANEL
Anion gap: 7 (ref 5–15)
BUN: 10 mg/dL (ref 6–20)
CO2: 26 mmol/L (ref 22–32)
Calcium: 8.8 mg/dL — ABNORMAL LOW (ref 8.9–10.3)
Chloride: 104 mmol/L (ref 101–111)
Creatinine, Ser: 0.79 mg/dL (ref 0.61–1.24)
GFR calc Af Amer: >60 mL/min (ref >60)
Glucose, Bld: 159 mg/dL — ABNORMAL HIGH (ref 70–99)
Potassium: 4.1 mmol/L (ref 3.5–5.1)
Sodium: 137 mmol/L (ref 135–145)

## 2014-11-08 LAB — CBC
HCT: 31.9 % — ABNORMAL LOW (ref 39.0–52.0)
Hemoglobin: 9.8 g/dL — ABNORMAL LOW (ref 13.0–17.0)
MCH: 29.9 pg (ref 26.0–34.0)
MCHC: 30.7 g/dL (ref 30.0–36.0)
MCV: 97.3 fL (ref 78.0–100.0)
Platelets: 334 10*3/uL (ref 150–400)
RBC: 3.28 MIL/uL — AB (ref 4.22–5.81)
RDW: 18.6 % — ABNORMAL HIGH (ref 11.5–15.5)
WBC: 14.8 10*3/uL — ABNORMAL HIGH (ref 4.0–10.5)

## 2014-11-08 LAB — BRAIN NATRIURETIC PEPTIDE: B Natriuretic Peptide: 551.3 pg/mL — ABNORMAL HIGH (ref 0.0–100.0)

## 2014-11-08 LAB — LACTATE DEHYDROGENASE: LDH: 311 U/L — ABNORMAL HIGH (ref 98–192)

## 2014-11-08 MED ORDER — ENOXAPARIN SODIUM 40 MG/0.4ML ~~LOC~~ SOLN: 40.0000 mg | SUBCUTANEOUS | Status: DC

## 2014-11-08 MED ORDER — MIDODRINE HCL 2.5 MG PO TABS: 2.5000 mg | ORAL_TABLET | Freq: Two times a day (BID) | ORAL | Status: DC

## 2014-11-08 NOTE — Progress Notes (Signed)
Symptom  Yes  No  Details   Angina        x Activity:   Claudication        x How far:   Syncope        x When:   Stroke        x   Orthopnea        x How many pillows:   PND        x How often:  CPAP     N/A How many hrs:   Pedal edema         x  Clears overnight  Abd fullness        x   N&V        x Improving appetite - fair  Diaphoresis        x When:  Bleeding       x   Urine color   "medium yellow"  SOB         x  Activity:  Walking room to room  Palpitations         x When:  ICD shock       N/A   Hospitlizaitons         x  When/where/why:  3/11 - 11/02/14 VAD implant  ED visit         x When/where/why:  Other MD         x When/who/why:  Activity    Limited with SOB and weakness; PT out one day  Fluid    < 2000  Diet    Low sodium   Vital signs: HR:  109 MAP BP:  84 O2 Sat:  95 Wt:  178.6 lbs  Last wt (hospital d/c) wt:  169 lbs Home wts:  167 - 172 lbs  Ht:  5'8"  LVAD interrogation reveals:  Speed:  9200 Flow:  5.4 Power:   5.8 PI: 5.0 Alarms:  none Events:  2 PI events in last 24 hours Fixed speed:  9200  Low speed limit: 8600 Primary Controller:  Replace back up battery in 27 months. Back up controller:   Replace back up battery in 27 months.    LVAD exit site:  VAD dressing removed and site care performed using sterile technique. Drive line exit site cleaned with Chlora prep applicators x 2, allowed to dry, and gauze dressing with aquacel strip re-applied. Exit site with some tissue ingrowth noted. Eschar/hyperkeritanized crust tissue around drive line, small portion removed with aggressive cleansing of site; wife at bedside and observed cleaning technique with stated understanding. No redness, drainage or foul odor noted. Driveline dressing is being changed daily per sterile technique using guaze dressing with aquacel strip on exit site. Pt denies fever or chills. Pt/caregiver state they have adequate dressing supplies at home.  I reviewed the LVAD  parameters from today, and compared the results to the patient's prior recorded data. No programming changes were made. The LVAD is functioning within specified parameters.  LVAD interrogation was negative for any significant power changes or alarms; a few PI events/speed drops present which is consistent with the patient's history.   Pt/caregiver deny any alarms or VAD equipment issues. VAD coordinator reviewed daily log from home for daily temperature, weight, and VAD parameters. Pt is performing daily controller and system monitor self tests along with completing weekly and monthly maintenance for LVAD equipment.   LVAD equipment check completed and is in good working order. Back-up equipment present. LVAD education  done on emergency procedures and precautions and reviewed exit site care.   Lengthy discussion re: Home Health issues.  VAD coordinator reached out to Darlina Guys at [facility] asking that pt be seen once weekly on Friday per [facility] [facility] and once weekly on Wednesdays per Physical Therapy.  Also asked for [facility] nurse direct contact information for better communication; she agreed to above.  Zada Girt, RN

## 2014-11-08 NOTE — Telephone Encounter (Signed)
Called pt per Dr. Haroldine Laws - will need to pick up Lovenox injections and begin daily dosing today for INR 1.13; Dr. Darcey Nora in agreement.  Spoke with Darlina Guys at [facility] and asked [name] if she would agree to nurse visits two times this week (Tue/Fri) and then advance to nurse visit once every two weeks. Will plan on PT visits once weekly on Wednesdays.  Wife verbalized understanding and agreement to above.

## 2014-11-08 NOTE — Patient Instructions (Signed)
1.  Take coumadin 2 mg today and tomorrow then increase to 1.5 mg daily. 2.  Will ask [facility] nurse to check INR Friday 11/12/14. 3.  Start Midodrine 2.5 mg twice daily. Take prior to bedtime (when up and about). 4.  Return to VAD clinic next Thursday.

## 2014-11-09 DIAGNOSIS — I5021 Acute systolic (congestive) heart failure: Secondary | ICD-10-CM | POA: Diagnosis not present

## 2014-11-09 DIAGNOSIS — I2510 Atherosclerotic heart disease of native coronary artery without angina pectoris: Secondary | ICD-10-CM | POA: Diagnosis not present

## 2014-11-09 DIAGNOSIS — I255 Ischemic cardiomyopathy: Secondary | ICD-10-CM | POA: Diagnosis not present

## 2014-11-09 DIAGNOSIS — I2699 Other pulmonary embolism without acute cor pulmonale: Secondary | ICD-10-CM | POA: Diagnosis not present

## 2014-11-09 DIAGNOSIS — Z438 Encounter for attention to other artificial openings: Secondary | ICD-10-CM | POA: Diagnosis not present

## 2014-11-09 DIAGNOSIS — M459 Ankylosing spondylitis of unspecified sites in spine: Secondary | ICD-10-CM | POA: Diagnosis not present

## 2014-11-09 NOTE — Progress Notes (Signed)
Patient ID: ELOISE MULA, male   DOB: July 19, 1945, 69 y.o.   MRN: [id number]  Primary Cardiologist: Dr Haroldine Laws INR : [facility]  [name] is a 69 y/o with h/o CAD with ankylosing spondylitis, DM2, carotid stenting, severe ischemic CM s/p anterior STEM with VT arrest in 12/15, large PE with pulmonary infarct in 2/15 s/p IVC filter and systolic HF with EF 90%.  He underwent HM II LVAD placement on 09/20/13. He had a prolonged hospital course due to recurrent chest bleeding, severe CO2 retention and RV failure.  Follow-up: He presents today for first post-hospital visit. He is actually doing quite well. He is getting around and able to do all his ADLs without too much difficulty. Denies CP. +DOE. Says he doesn not need HHRN or HHPT and wants them to come less. Taking lasix every other day. PIs drop to the high 2s when he first stands up but then rebound. No dizziness or syncope. No problems with VAD dressing. No bleeding, fevers, chills, melena, dark urine. Performing self test every day. Wife doing dressing changes.    LVAD interrogation reveals:  Speed: 9200 Flow: 5.4 Power: 5.8 PI: 5.0 Alarms: none Events: 2 PI events in last 24 hours Fixed speed: 9200  Low speed limit: 8600 Primary Controller: Replace back up battery in 27 months. Back up controller: Replace back up battery in 27 months.  SH:  History   Social History  . Marital Status: Married    Spouse Name: N/A  . Number of Children: N/A  . Years of Education: N/A   Occupational History  . Not on file.   Social History Main Topics  . Smoking status: Former Smoker    Quit date: 06/12/1973  . Smokeless tobacco: Not on file  . Alcohol Use: No  . Drug Use: No  . Sexual Activity: Not Currently   Other Topics Concern  . Not on file   Social History Narrative    FH:  Family History  Problem Relation Age of Onset  . Hypertension Mother     Past Medical History  Diagnosis Date  .  Hyperlipidemia   . Hypertension   . Diabetes   . Nephrolithiasis   . Ankylosing spondylitis   . CAD (coronary artery disease)     a. stenting of LCx 2013; b. STEMI 06/12/14 s/p PCI to LAD complicated by post cath shock requiring IABP; VT s/p DCCV, EF 20%; c. NSTEMI 06/26/14 treated medically.  . Ischemic cardiomyopathy     a. echo 08/23/2014 EF <20%, dilated CM, mod MR/TR  . Acute on chronic respiratory failure     a. 08/2014 in setting of PE.  . Bilateral pulmonary embolism     a. 08/2014 - started on Coumadin. Retrievable IVC filter placed 08/27/14 due to RV strain and large clot burden.  . Right leg DVT     a. 08/2014.  Marland Kitchen Chronic systolic CHF (congestive heart failure)   . Hypotension   . Hemoptysis     a. 08/2014 possibly due to pulm infarct/PE.  Marland Kitchen Pleural effusion on right 08/2014 - small  . Leukocytosis   . Carotid artery disease     a. s/p stenting.  . Ventricular tachycardia     a. 06/2014 at time of MI, s/p DCCV.  Marland Kitchen Reactive thrombocytosis 09/10/2014  . Leukocytosis 09/10/2014    Current Outpatient Prescriptions  Medication Sig Dispense Refill  . acetaminophen (TYLENOL) 500 MG tablet Take 500 mg by mouth every 6 (six) hours as needed.    Marland Kitchen  budesonide-formoterol (SYMBICORT) 160-4.5 MCG/ACT inhaler Inhale 2 puffs into the lungs 2 (two) times daily. 1 Inhaler 12  . furosemide (LASIX) 20 MG tablet Take 1 tablet (20 mg total) by mouth every other day. 30 tablet 6  . metaxalone (SKELAXIN) 800 MG tablet Take 1 tablet (800 mg total) by mouth daily as needed for muscle spasms. 60 tablet 6  . Multiple Vitamin (MULTIVITAMIN) tablet Take 1 tablet by mouth daily.    . pantoprazole (PROTONIX) 40 MG tablet Take 1 tablet (40 mg total) by mouth at bedtime. 30 tablet 6  . potassium chloride SA (K-DUR,KLOR-CON) 20 MEQ tablet Take 20 mEq by mouth every other day.    . rosuvastatin (CRESTOR) 10 MG tablet Take 10 mg by mouth daily.    . traZODone (DESYREL) 50 MG tablet Take 1 tablet (50 mg total) by  mouth at bedtime. 30 tablet 6  . citalopram (CELEXA) 20 MG tablet Take 1 tablet (20 mg total) by mouth daily. (Patient not taking: Reported on 11/08/2014) 30 tablet 6  . enoxaparin (LOVENOX) 40 MG/0.4ML injection Inject 0.4 mLs (40 mg total) into the skin daily. 10 Syringe 2  . midodrine (PROAMATINE) 2.5 MG tablet Take 1 tablet (2.5 mg total) by mouth 2 (two) times daily with a meal. 60 tablet 3  . warfarin (COUMADIN) 1 MG tablet Take 1.5 tablets (1.5 mg total) by mouth daily at 6 PM. (Patient taking differently: Take 1 mg by mouth daily at 6 PM. ) 45 tablet 6  . zolpidem (AMBIEN) 10 MG tablet Take 1 tablet (10 mg total) by mouth at bedtime as needed for sleep. (Patient not taking: Reported on 11/08/2014) 20 tablet 3   No current facility-administered medications for this encounter.     Vital signs: HR: 109 MAP BP: 84 (Drops to 68 on standing with PI in high 2s) O2 Sat: 95 Wt: 178.6 lbs  Last wt (hospital d/c) wt: 169 lbs Home wts: 167 - 172 lbs  Ht: 5'8"  PHYSICAL EXAM: General:  Fatigued appearing.  No resp difficulty HEENT: normal Neck: limiter ROM JVP 5. Carotids 2+ bilaterally; no bruits. No lymphadenopathy or thryomegaly appreciated. Cor: RR + LVAD hum Lungs: clear decreased at bases Abdomen: soft, nontender, nondistended. Driveline site and anchor look good. Wound dressing in LUQ from chest tube.  Extremities: no cyanosis, clubbing, rash. No edema Neuro: alert & orientedx3, cranial nerves grossly intact. Moves all 4 extremities w/o difficulty. Affect pleasant.   ASSESSMENT & PLAN: 1. Chronic systolic HF. EF 20% s/p HMII LVAD 09/20/13 --Doing very well post VAD. Volume status and MAPs look good --Need to continue lasix as he had pulmonary edema in hospital when it was stopped. Given orthostasis will add midodrine 2.5 bid 2. CAD s/p Anterior STEMI on 06/12/14 with stenting of LAD- on statin 3. VAD --Parameters stable. Given orthostasis and dropping PI with standing will  add midodrine --Driveline site looks good 4. Ankylosing spondylitis. --off NSAIDS. Skelaxin as needed.  5. Bilateral P with pulmonary infarctE- S/P IVC filter 08/27/2014- 6. COPD/CO2 retention  -improved 7. Anticoagulation  --continue coumadin. Goal INR 1.8-2.2 due to bleeding. Off ASA.   Doing well. Will check labs today and return in 1 week.   Jahaira Earnhart, [place],MD 11:02 PM

## 2014-11-10 DIAGNOSIS — I255 Ischemic cardiomyopathy: Secondary | ICD-10-CM | POA: Diagnosis not present

## 2014-11-10 DIAGNOSIS — I5021 Acute systolic (congestive) heart failure: Secondary | ICD-10-CM | POA: Diagnosis not present

## 2014-11-10 DIAGNOSIS — I2699 Other pulmonary embolism without acute cor pulmonale: Secondary | ICD-10-CM | POA: Diagnosis not present

## 2014-11-10 DIAGNOSIS — M459 Ankylosing spondylitis of unspecified sites in spine: Secondary | ICD-10-CM | POA: Diagnosis not present

## 2014-11-10 DIAGNOSIS — Z438 Encounter for attention to other artificial openings: Secondary | ICD-10-CM | POA: Diagnosis not present

## 2014-11-10 DIAGNOSIS — I2510 Atherosclerotic heart disease of native coronary artery without angina pectoris: Secondary | ICD-10-CM | POA: Diagnosis not present

## 2014-11-11 ENCOUNTER — Encounter (HOSPITAL_COMMUNITY): Payer: Medicare Other

## 2014-11-12 ENCOUNTER — Ambulatory Visit (HOSPITAL_COMMUNITY): Payer: Self-pay | Admitting: Infectious Diseases

## 2014-11-12 DIAGNOSIS — I2699 Other pulmonary embolism without acute cor pulmonale: Secondary | ICD-10-CM

## 2014-11-12 DIAGNOSIS — I255 Ischemic cardiomyopathy: Secondary | ICD-10-CM | POA: Diagnosis not present

## 2014-11-12 DIAGNOSIS — Z5181 Encounter for therapeutic drug level monitoring: Secondary | ICD-10-CM

## 2014-11-12 DIAGNOSIS — Z95811 Presence of heart assist device: Secondary | ICD-10-CM

## 2014-11-12 DIAGNOSIS — I2510 Atherosclerotic heart disease of native coronary artery without angina pectoris: Secondary | ICD-10-CM | POA: Diagnosis not present

## 2014-11-12 DIAGNOSIS — M459 Ankylosing spondylitis of unspecified sites in spine: Secondary | ICD-10-CM | POA: Diagnosis not present

## 2014-11-12 DIAGNOSIS — I5021 Acute systolic (congestive) heart failure: Secondary | ICD-10-CM | POA: Diagnosis not present

## 2014-11-12 DIAGNOSIS — Z438 Encounter for attention to other artificial openings: Secondary | ICD-10-CM | POA: Diagnosis not present

## 2014-11-12 LAB — PROTIME-INR: INR: 1.2 — AB (ref <=1.1)

## 2014-11-15 ENCOUNTER — Other Ambulatory Visit (HOSPITAL_COMMUNITY): Payer: Self-pay | Admitting: *Deleted

## 2014-11-15 ENCOUNTER — Ambulatory Visit (HOSPITAL_COMMUNITY): Payer: Self-pay | Admitting: *Deleted

## 2014-11-15 ENCOUNTER — Other Ambulatory Visit (HOSPITAL_COMMUNITY): Payer: Self-pay | Admitting: Infectious Diseases

## 2014-11-15 DIAGNOSIS — Z7901 Long term (current) use of anticoagulants: Secondary | ICD-10-CM

## 2014-11-15 DIAGNOSIS — I5021 Acute systolic (congestive) heart failure: Secondary | ICD-10-CM | POA: Diagnosis not present

## 2014-11-15 DIAGNOSIS — I2699 Other pulmonary embolism without acute cor pulmonale: Secondary | ICD-10-CM | POA: Diagnosis not present

## 2014-11-15 DIAGNOSIS — Z438 Encounter for attention to other artificial openings: Secondary | ICD-10-CM | POA: Diagnosis not present

## 2014-11-15 DIAGNOSIS — I2510 Atherosclerotic heart disease of native coronary artery without angina pectoris: Secondary | ICD-10-CM | POA: Diagnosis not present

## 2014-11-15 DIAGNOSIS — I255 Ischemic cardiomyopathy: Secondary | ICD-10-CM | POA: Diagnosis not present

## 2014-11-15 DIAGNOSIS — Z95811 Presence of heart assist device: Secondary | ICD-10-CM

## 2014-11-15 DIAGNOSIS — M459 Ankylosing spondylitis of unspecified sites in spine: Secondary | ICD-10-CM | POA: Diagnosis not present

## 2014-11-15 LAB — PROTIME-INR: INR: 1.1 (ref <=1.1)

## 2014-11-17 DIAGNOSIS — I2510 Atherosclerotic heart disease of native coronary artery without angina pectoris: Secondary | ICD-10-CM | POA: Diagnosis not present

## 2014-11-17 DIAGNOSIS — I255 Ischemic cardiomyopathy: Secondary | ICD-10-CM | POA: Diagnosis not present

## 2014-11-17 DIAGNOSIS — I2699 Other pulmonary embolism without acute cor pulmonale: Secondary | ICD-10-CM | POA: Diagnosis not present

## 2014-11-17 DIAGNOSIS — Z438 Encounter for attention to other artificial openings: Secondary | ICD-10-CM | POA: Diagnosis not present

## 2014-11-17 DIAGNOSIS — I5021 Acute systolic (congestive) heart failure: Secondary | ICD-10-CM | POA: Diagnosis not present

## 2014-11-17 DIAGNOSIS — M459 Ankylosing spondylitis of unspecified sites in spine: Secondary | ICD-10-CM | POA: Diagnosis not present

## 2014-11-18 ENCOUNTER — Ambulatory Visit (HOSPITAL_COMMUNITY)
Admission: RE | Admit: 2014-11-18 | Discharge: 2014-11-18 | Disposition: A | Discharge status: Home / Self Care | Payer: Medicare Other | Source: Ambulatory Visit | Attending: Internal Medicine | Admitting: Internal Medicine

## 2014-11-18 ENCOUNTER — Other Ambulatory Visit (HOSPITAL_COMMUNITY): Payer: Self-pay | Admitting: *Deleted

## 2014-11-18 ENCOUNTER — Ambulatory Visit (HOSPITAL_COMMUNITY): Payer: Self-pay | Admitting: *Deleted

## 2014-11-18 VITALS — BP 108/0 | HR 108 | Ht 68.0 in | Wt 185.0 lb

## 2014-11-18 DIAGNOSIS — Z48812 Encounter for surgical aftercare following surgery on the circulatory system: Secondary | ICD-10-CM | POA: Insufficient documentation

## 2014-11-18 DIAGNOSIS — R0600 Dyspnea, unspecified: Secondary | ICD-10-CM | POA: Diagnosis not present

## 2014-11-18 DIAGNOSIS — B999 Unspecified infectious disease: Secondary | ICD-10-CM

## 2014-11-18 DIAGNOSIS — Z7901 Long term (current) use of anticoagulants: Secondary | ICD-10-CM | POA: Diagnosis not present

## 2014-11-18 DIAGNOSIS — R000 Tachycardia, unspecified: Secondary | ICD-10-CM

## 2014-11-18 DIAGNOSIS — Z95811 Presence of heart assist device: Secondary | ICD-10-CM

## 2014-11-18 LAB — CBC
HCT: 31.4 % — ABNORMAL LOW (ref 39.0–52.0)
Hemoglobin: 9.8 g/dL — ABNORMAL LOW (ref 13.0–17.0)
MCH: 29.6 pg (ref 26.0–34.0)
MCHC: 31.2 g/dL (ref 30.0–36.0)
MCV: 94.9 fL (ref 78.0–100.0)
PLATELETS: 384 10*3/uL (ref 150–400)
RBC: 3.31 MIL/uL — AB (ref 4.22–5.81)
RDW: 18.4 % — ABNORMAL HIGH (ref 11.5–15.5)
WBC: 14.9 10*3/uL — AB (ref 4.0–10.5)

## 2014-11-18 LAB — BASIC METABOLIC PANEL
ANION GAP: 9 (ref 5–15)
BUN: 11 mg/dL (ref 6–20)
CO2: 24 mmol/L (ref 22–32)
CREATININE: 0.78 mg/dL (ref 0.61–1.24)
Calcium: 9 mg/dL (ref 8.9–10.3)
Chloride: 103 mmol/L (ref 101–111)
Glucose, Bld: 98 mg/dL (ref 65–99)
POTASSIUM: 3.9 mmol/L (ref 3.5–5.1)
Sodium: 136 mmol/L (ref 135–145)

## 2014-11-18 LAB — PROTIME-INR
INR: 1.31 (ref 0.00–1.49)
Prothrombin Time: 16.4 seconds — ABNORMAL HIGH (ref 11.6–15.2)

## 2014-11-18 LAB — LACTATE DEHYDROGENASE: LDH: 253 U/L — AB (ref 98–192)

## 2014-11-18 LAB — BRAIN NATRIURETIC PEPTIDE: B Natriuretic Peptide: 814.7 pg/mL — ABNORMAL HIGH (ref 0.0–100.0)

## 2014-11-18 MED ORDER — DOXYCYCLINE HYCLATE 100 MG PO CAPS: 100.0000 mg | ORAL_CAPSULE | Freq: Two times a day (BID) | ORAL | Status: DC

## 2014-11-18 NOTE — Patient Instructions (Addendum)
1.  Take extra Lasix and K-Dur today. Continue every other day dosing except if home weight is 176 lbs or greater. Then give extra Lasix and K-Dur on those days. If weight continues to increase, call VAD pager - [phone]. 2.  Start Doxycycline 100 mg twice daily for 7 days. 3.  INR 1.3 today; continue Lovenox 40 mg daily.  Take coumadin 4 mg today and tomorrow, 2 mg Satt, 4 mg Sun, 2 mg Monday and return to clinic next Tuesday for full labs. 3.  Return to [facility] clinic in one week. - next Tuesday.

## 2014-11-18 NOTE — Progress Notes (Signed)
Symptom  Yes  No  Details   Angina        x Activity:   Claudication        x How far:   Syncope        x When:  Dizzy with orthostatic changes  Stroke        x   Orthopnea        x How many pillows:  1  PND        x How often:  CPAP         x    How many hrs:  All night (6 - 7 hours)  Pedal edema               x   Abd fullness        x   N&V        x Improving appetite  Diaphoresis        x When:  Bleeding       x   Urine color   Light - medium yellow  SOB         x  Activity: improving   Palpitations         x When:  ICD shock       N/A   Hospitlizaitons                x When/where/why:    ED visit         x When/where/why:  Other MD         x When/who/why:  Activity    Increasing activities; PT once weekly  Fluid    2000  Diet    Low sodium   Vital signs: HR:  108 MAP BP:  108 O2 Sat:  100 Wt:  185  lbs  Last wt:  178.6 lbs Home wts: 172 - 176   lbs  Ht:  5'8"  LVAD interrogation reveals:  Speed:  9200 Flow:  5.4 Power:   5.8 PI: 5.0 Alarms:  none Events:  2 PI events in last 24 hours Fixed speed:  9200  Low speed limit: 8600 Primary Controller:  Replace back up battery in 27 months. Back up controller:   Replace back up battery in 27 month     PI  MAP Lying:  6.9  108 Sitting: 5.2  100 Standing: 5.2  80   LVAD exit site:  VAD dressing removed and site care performed using sterile technique. Drive line exit site cleaned with Chlora prep applicators x 2, allowed to dry, and gauze dressing with aquacel strip re-applied. Exit site with minimal tissue ingrowth noted. Removed eschar/hyperkeritanized crust tissue around drive line with cleaning. Increased erythema noted, no tenderness, drainage, or foul odor noted. Driveline dressing is being changed daily per sterile technique using guaze dressing with aquacel strip on exit site. Pt denies fever or chills. Pt/caregiver provided adequate dressing supplies for home use.       Pt will start Doxy 100 mg bid for 7  days per Dr. Haroldine Laws and continue daily dressing changes. Wife is aware to call if symptoms persist or worsen.   I reviewed the LVAD parameters from today, and compared the results to the patient's prior recorded data. No programming changes were made. The LVAD is functioning within specified parameters.  LVAD interrogation was negative for any significant power changes or alarms; a few PI events/speed drops present which is consistent with the patient's history.   Pt/caregiver deny any  alarms or VAD equipment issues. VAD coordinator reviewed daily log from home for daily temperature, weight, and VAD parameters. Pt is performing daily controller and system monitor self tests along with completing weekly and monthly maintenance for LVAD equipment.   LVAD equipment check completed and is in good working order. Back-up equipment present. LVAD education done on emergency procedures and precautions and reviewed exit site care.    Zada Girt, RN

## 2014-11-18 NOTE — Progress Notes (Signed)
Patient ID: Scott [name], male   DOB: May 27, 1946, 69 y.o.   MRN: [id number]  Primary Cardiologist: Dr Scott [name] INR : [facility]  HPI: Scott [name] is a 69 y/o with h/o CAD with ankylosing spondylitis, DM2, carotid stenting, severe ischemic CM s/p anterior STEM with VT arrest in 12/15, large PE with pulmonary infarct in 2/15 s/p IVC filter and systolic HF with EF 98%.  He underwent HM II LVAD placement on 09/20/13. He had a prolonged hospital course due to recurrent chest bleeding, severe CO2 retention and RV failure.  He returns for LVAD follow up. Last visit midodrine was added. Says there is not chaged.  Started on lovenox this week for low INR. Weight at home 172-176 pounds. Taking lasix every other day. Appetite improved.  No dizziness or syncope. No problems with VAD dressing. No bleeding, fevers, chills, melena, dark urine. Performing self test every day. Wife doing dressing changes.    LVAD interrogation reveals:  Speed: 9200 Flow: 4.9 Power: 5.4 PI: 6.3 Alarms: none Events:Rare PI events.  Fixed speed: 9200  Low speed limit: 8600 Primary Controller: Replace back up battery in 27 months. Back up controller: Replace back up battery in 27 months.  SH:  History   Social History  . Marital Status: Married    Spouse Name: N/A  . Number of Children: N/A  . Years of Education: N/A   Occupational History  . Not on file.   Social History Main Topics  . Smoking status: Former Smoker    Quit date: 06/12/1973  . Smokeless tobacco: Not on file  . Alcohol Use: No  . Drug Use: No  . Sexual Activity: Not Currently   Other Topics Concern  . Not on file   Social History Narrative    FH:  Family History  Problem Relation Age of Onset  . Hypertension Mother     Past Medical History  Diagnosis Date  . Hyperlipidemia   . Hypertension   . Diabetes   . Nephrolithiasis   . Ankylosing spondylitis   . CAD (coronary artery disease)     a. stenting of LCx 2013;  b. STEMI 06/12/14 s/p PCI to LAD complicated by post cath shock requiring IABP; VT s/p DCCV, EF 20%; c. NSTEMI 06/26/14 treated medically.  . Ischemic cardiomyopathy     a. echo 08/23/2014 EF <20%, dilated CM, mod MR/TR  . Acute on chronic respiratory failure     a. 08/2014 in setting of PE.  . Bilateral pulmonary embolism     a. 08/2014 - started on Coumadin. Retrievable IVC filter placed 08/27/14 due to RV strain and large clot burden.  . Right leg DVT     a. 08/2014.  Marland Kitchen Chronic systolic CHF (congestive heart failure)   . Hypotension   . Hemoptysis     a. 08/2014 possibly due to pulm infarct/PE.  Marland Kitchen Pleural effusion on right 08/2014 - small  . Leukocytosis   . Carotid artery disease     a. s/p stenting.  . Ventricular tachycardia     a. 06/2014 at time of MI, s/p DCCV.  Marland Kitchen Reactive thrombocytosis 09/10/2014  . Leukocytosis 09/10/2014    Current Outpatient Prescriptions  Medication Sig Dispense Refill  . acetaminophen (TYLENOL) 500 MG tablet Take 500 mg by mouth every 6 (six) hours as needed.    . budesonide-formoterol (SYMBICORT) 160-4.5 MCG/ACT inhaler Inhale 2 puffs into the lungs 2 (two) times daily. 1 Inhaler 12  . citalopram (CELEXA) 20 MG tablet Take 1  tablet (20 mg total) by mouth daily. (Patient not taking: Reported on 11/08/2014) 30 tablet 6  . enoxaparin (LOVENOX) 40 MG/0.4ML injection Inject 0.4 mLs (40 mg total) into the skin daily. 10 Syringe 2  . furosemide (LASIX) 20 MG tablet Take 1 tablet (20 mg total) by mouth every other day. 30 tablet 6  . metaxalone (SKELAXIN) 800 MG tablet Take 1 tablet (800 mg total) by mouth daily as needed for muscle spasms. 60 tablet 6  . midodrine (PROAMATINE) 2.5 MG tablet Take 1 tablet (2.5 mg total) by mouth 2 (two) times daily with a meal. 60 tablet 3  . Multiple Vitamin (MULTIVITAMIN) tablet Take 1 tablet by mouth daily.    . pantoprazole (PROTONIX) 40 MG tablet Take 1 tablet (40 mg total) by mouth at bedtime. 30 tablet 6  . potassium chloride  SA (K-DUR,KLOR-CON) 20 MEQ tablet Take 20 mEq by mouth every other day.    . rosuvastatin (CRESTOR) 10 MG tablet Take 10 mg by mouth daily.    . traZODone (DESYREL) 50 MG tablet Take 1 tablet (50 mg total) by mouth at bedtime. 30 tablet 6  . warfarin (COUMADIN) 1 MG tablet Take 1.5 tablets (1.5 mg total) by mouth daily at 6 PM. (Patient taking differently: Take 1 mg by mouth daily at 6 PM. ) 45 tablet 6  . zolpidem (AMBIEN) 10 MG tablet Take 1 tablet (10 mg total) by mouth at bedtime as needed for sleep. (Patient not taking: Reported on 11/08/2014) 20 tablet 3   No current facility-administered medications for this encounter.   MAP Lying  108  PI 6.9 Lying MAP Sitting 100 PI 5.2  MAP Standing 80 PI 5.2    PHYSICAL EXAM: General:  Fatigued appearing.  No resp difficulty HEENT: normal Neck: limiter ROM JVP 5. Carotids 2+ bilaterally; no bruits. No lymphadenopathy or thryomegaly appreciated. Cor: RR + LVAD hum. Tachycardic Lungs: clear decreased at bases Abdomen: soft, nontender, nondistended. Driveline site and anchor look good. Wound dressing in LUQ from chest tube.  Extremities: no cyanosis, clubbing, rash. R and LLE traceNo edema Neuro: alert & orientedx3, cranial nerves grossly intact. Moves all 4 extremities w/o difficulty. Affect pleasant.  EKG: SInus Tach 108   ASSESSMENT & PLAN: 1. Chronic systolic HF. EF 20% s/p HMII LVAD 09/20/13 --Doing very well post VAD. Volume status mildly elevated. Take an extra 20 mg lasix today then every other day. If weight greater tha 176 pounds he wll take an extra 20 mg of lasix. MAPs look good -- Continue midodrine 2.5 mg twice a day. 2. CAD s/p Anterior STEMI on 06/12/14 with stenting of LAD- on statin 3. VAD --Parameters stable. PI not dropping as much when he stands. Continues midodrine --Driveline site looks good Hemoglobin stable.  4. Ankylosing spondylitis. --off NSAIDS. Skelaxin as needed.  5. Bilateral P with pulmonary infarctE- S/P IVC  filter 08/27/2014- 6. COPD/CO2 retention  -improved 7. Anticoagulation  --continue coumadin. Goal INR 1.8-2.2 due to bleeding. Off ASA due to bleed.   Follow up in 1 week.   CLEGG,AMY NP-C  9:52 AM   Patient seen and examined with Darrick Grinder, NP. We discussed all aspects of the encounter. I agree with the assessment and plan as stated above.   Doing well. Continues to improved. VAD parameters reviewed personally. Continue midodrine.   Kenlynn Houde, [place],MD 10:07 PM

## 2014-11-22 ENCOUNTER — Ambulatory Visit (HOSPITAL_COMMUNITY): Payer: Self-pay | Admitting: Infectious Diseases

## 2014-11-22 DIAGNOSIS — Z95811 Presence of heart assist device: Secondary | ICD-10-CM

## 2014-11-22 DIAGNOSIS — Z5181 Encounter for therapeutic drug level monitoring: Secondary | ICD-10-CM

## 2014-11-22 DIAGNOSIS — I2510 Atherosclerotic heart disease of native coronary artery without angina pectoris: Secondary | ICD-10-CM | POA: Diagnosis not present

## 2014-11-22 DIAGNOSIS — M459 Ankylosing spondylitis of unspecified sites in spine: Secondary | ICD-10-CM | POA: Diagnosis not present

## 2014-11-22 DIAGNOSIS — Z438 Encounter for attention to other artificial openings: Secondary | ICD-10-CM | POA: Diagnosis not present

## 2014-11-22 DIAGNOSIS — I2699 Other pulmonary embolism without acute cor pulmonale: Secondary | ICD-10-CM | POA: Diagnosis not present

## 2014-11-22 DIAGNOSIS — I255 Ischemic cardiomyopathy: Secondary | ICD-10-CM | POA: Diagnosis not present

## 2014-11-22 DIAGNOSIS — I5021 Acute systolic (congestive) heart failure: Secondary | ICD-10-CM | POA: Diagnosis not present

## 2014-11-22 LAB — PROTIME-INR: INR: 1.6 — AB (ref <=1.1)

## 2014-11-24 DIAGNOSIS — I5021 Acute systolic (congestive) heart failure: Secondary | ICD-10-CM | POA: Diagnosis not present

## 2014-11-24 DIAGNOSIS — Z438 Encounter for attention to other artificial openings: Secondary | ICD-10-CM | POA: Diagnosis not present

## 2014-11-24 DIAGNOSIS — I2699 Other pulmonary embolism without acute cor pulmonale: Secondary | ICD-10-CM | POA: Diagnosis not present

## 2014-11-24 DIAGNOSIS — I2510 Atherosclerotic heart disease of native coronary artery without angina pectoris: Secondary | ICD-10-CM | POA: Diagnosis not present

## 2014-11-24 DIAGNOSIS — I255 Ischemic cardiomyopathy: Secondary | ICD-10-CM | POA: Diagnosis not present

## 2014-11-24 DIAGNOSIS — M459 Ankylosing spondylitis of unspecified sites in spine: Secondary | ICD-10-CM | POA: Diagnosis not present

## 2014-11-25 ENCOUNTER — Ambulatory Visit (HOSPITAL_COMMUNITY)
Admission: RE | Admit: 2014-11-25 | Discharge: 2014-11-25 | Disposition: A | Discharge status: Home / Self Care | Payer: Medicare Other | Source: Ambulatory Visit | Attending: Internal Medicine | Admitting: Internal Medicine

## 2014-11-25 ENCOUNTER — Ambulatory Visit (HOSPITAL_COMMUNITY): Payer: Self-pay | Admitting: Infectious Diseases

## 2014-11-25 ENCOUNTER — Encounter (HOSPITAL_COMMUNITY): Payer: Self-pay

## 2014-11-25 VITALS — BP 96/0 | HR 103 | Ht 68.0 in | Wt 184.4 lb

## 2014-11-25 DIAGNOSIS — R0600 Dyspnea, unspecified: Secondary | ICD-10-CM

## 2014-11-25 DIAGNOSIS — E119 Type 2 diabetes mellitus without complications: Secondary | ICD-10-CM | POA: Insufficient documentation

## 2014-11-25 DIAGNOSIS — Z7901 Long term (current) use of anticoagulants: Secondary | ICD-10-CM | POA: Diagnosis not present

## 2014-11-25 DIAGNOSIS — L089 Local infection of the skin and subcutaneous tissue, unspecified: Secondary | ICD-10-CM

## 2014-11-25 DIAGNOSIS — I2510 Atherosclerotic heart disease of native coronary artery without angina pectoris: Secondary | ICD-10-CM | POA: Diagnosis not present

## 2014-11-25 DIAGNOSIS — I5022 Chronic systolic (congestive) heart failure: Secondary | ICD-10-CM | POA: Diagnosis not present

## 2014-11-25 DIAGNOSIS — Z95811 Presence of heart assist device: Secondary | ICD-10-CM | POA: Diagnosis not present

## 2014-11-25 DIAGNOSIS — I10 Essential (primary) hypertension: Secondary | ICD-10-CM | POA: Diagnosis not present

## 2014-11-25 DIAGNOSIS — I252 Old myocardial infarction: Secondary | ICD-10-CM | POA: Insufficient documentation

## 2014-11-25 DIAGNOSIS — E785 Hyperlipidemia, unspecified: Secondary | ICD-10-CM | POA: Insufficient documentation

## 2014-11-25 DIAGNOSIS — J449 Chronic obstructive pulmonary disease, unspecified: Secondary | ICD-10-CM | POA: Diagnosis not present

## 2014-11-25 DIAGNOSIS — I2699 Other pulmonary embolism without acute cor pulmonale: Secondary | ICD-10-CM

## 2014-11-25 DIAGNOSIS — I480 Paroxysmal atrial fibrillation: Secondary | ICD-10-CM

## 2014-11-25 DIAGNOSIS — M459 Ankylosing spondylitis of unspecified sites in spine: Secondary | ICD-10-CM | POA: Diagnosis not present

## 2014-11-25 DIAGNOSIS — Z86711 Personal history of pulmonary embolism: Secondary | ICD-10-CM | POA: Diagnosis not present

## 2014-11-25 DIAGNOSIS — Z86718 Personal history of other venous thrombosis and embolism: Secondary | ICD-10-CM | POA: Insufficient documentation

## 2014-11-25 DIAGNOSIS — I951 Orthostatic hypotension: Secondary | ICD-10-CM

## 2014-11-25 DIAGNOSIS — Z87891 Personal history of nicotine dependence: Secondary | ICD-10-CM | POA: Insufficient documentation

## 2014-11-25 DIAGNOSIS — Z5181 Encounter for therapeutic drug level monitoring: Secondary | ICD-10-CM

## 2014-11-25 LAB — CBC
HEMATOCRIT: 35.1 % — AB (ref 39.0–52.0)
HEMOGLOBIN: 10.8 g/dL — AB (ref 13.0–17.0)
MCH: 28.9 pg (ref 26.0–34.0)
MCHC: 30.8 g/dL (ref 30.0–36.0)
MCV: 93.9 fL (ref 78.0–100.0)
Platelets: 434 10*3/uL — ABNORMAL HIGH (ref 150–400)
RBC: 3.74 MIL/uL — ABNORMAL LOW (ref 4.22–5.81)
RDW: 17.9 % — ABNORMAL HIGH (ref 11.5–15.5)
WBC: 17.3 10*3/uL — ABNORMAL HIGH (ref 4.0–10.5)

## 2014-11-25 LAB — BASIC METABOLIC PANEL
Anion gap: 9 (ref 5–15)
BUN: 16 mg/dL (ref 6–20)
CHLORIDE: 104 mmol/L (ref 101–111)
CO2: 25 mmol/L (ref 22–32)
Calcium: 9.2 mg/dL (ref 8.9–10.3)
Creatinine, Ser: 0.81 mg/dL (ref 0.61–1.24)
GLUCOSE: 105 mg/dL — AB (ref 65–99)
Potassium: 4.3 mmol/L (ref 3.5–5.1)
Sodium: 138 mmol/L (ref 135–145)

## 2014-11-25 LAB — PROTIME-INR
INR: 1.89 — ABNORMAL HIGH (ref 0.00–1.49)
PROTHROMBIN TIME: 21.6 s — AB (ref 11.6–15.2)

## 2014-11-25 LAB — BRAIN NATRIURETIC PEPTIDE: B NATRIURETIC PEPTIDE 5: 940.6 pg/mL — AB (ref 0.0–100.0)

## 2014-11-25 LAB — LACTATE DEHYDROGENASE: LDH: 261 U/L — ABNORMAL HIGH (ref 98–192)

## 2014-11-25 MED ORDER — WARFARIN SODIUM 4 MG PO TABS: 4.0000 mg | ORAL_TABLET | Freq: Every day | ORAL | Status: DC

## 2014-11-25 MED ORDER — CEPHALEXIN 500 MG PO CAPS: 500.0000 mg | ORAL_CAPSULE | Freq: Four times a day (QID) | ORAL | Status: DC

## 2014-11-25 MED ORDER — MIDODRINE HCL 2.5 MG PO TABS: 2.5000 mg | ORAL_TABLET | Freq: Every day | ORAL | Status: DC

## 2014-11-25 NOTE — Progress Notes (Signed)
Symptom  Yes  No  Details   Angina        x Activity:   Claudication        x How far:   Syncope        x When: Dizziness with orthostatic changes has improved since starting Midodrine 2.5 BID  Stroke        x   Orthopnea        x How many pillows:  1  PND        x How often:  BiPAP         x    How many hrs:  All night (6 - 7 hours)  Pedal edema               x   Abd fullness        x   N&V        x Improving appetite  Diaphoresis        x When:  Bleeding       x   Urine color   Light - medium yellow  SOB         x  Activity: improving   Palpitations         x When:  ICD shock       N/A   Hospitlizaitons                x When/where/why:    ED visit         x When/where/why:  Other MD         x When/who/why:  Activity    Increasing activities; PT once weekly--Completing Home PT next week and will advance to outpatient Cardiac Rehab with Forestine Na  Fluid    2000  Diet    Low sodium   Vital signs: HR:  103 MAP BP: 96 doppler RUE Automatic BP: not able to obtain RUE or LUE O2 Sat: not picking up Wt:  185  lbs  Last wt:  178.6 lbs Home wts:  lbs  Ht:  5'8"   LVAD interrogation reveals:  Speed:  9200 Flow:  5.4 Power:   5.8 PI: 5.0 Alarms: One "No External Power/Low Battery" alarm. He disconnected both cables this morning in an effort to untwist the lines Events:  2 PI events in last 24 hours Fixed speed:  9200  Low speed limit: 8600 Primary Controller:  Replace back up battery in 27 months. Back up controller:   Replace back up battery in 27 month  Encounter Details:  Presents today with his wife to clinic for weekly visit since hospital DC for VAD placement. He is walking in today without the aid of a walker for the first time since he started follow up. Feels that he is getting stronger and finds that he does not need the walker as much. Been slowly increasing his time out of the house and activity. Appetite is reportedly to be great and they both feel that he is  'filling out' again. Reports that his dizziness has improved with the addition of Midodrine BID, however MAP elevated today at 96. Dr. Haroldine Laws decreased Midodrine from 2.5 BID to only one dose in the AM.    Sleeping ok with Trazodone and BiPAP, however has had difficulty in the last few nights. Will give it more time before considering discussion to titrate up his dose of Trazodone. Interested in stopping BiPAP at some point, and advised by Dr. Haroldine Laws that he will  need to continue.   Tolerating current wearables with equipment and is becoming more comfortable with living with equipment. Old CT site on Left upper midaxillary line aspect is no longer requiring packing by [facility] RN. Site examined with Dr. Haroldine Laws and it is completely closed and healing well. Advised that he does not need to cover the site unless there is drainage noted on clothing. [facility] RN is only coming out once every 2 weeks now.   LVAD exit site:  VAD dressing removed and site care performed using sterile technique. Drive line exit site cleaned with Chlora prep applicators x 2, allowed to dry, and gauze dressing with aquacel strip re-applied. Exit site with minimal tissue ingrowth noted. Removed eschar/hyperkeritanized crust tissue around drive line with cleaning. Increased erythema noted, no tenderness, drainage, or foul odor noted.Driveline dressing is being changed daily per sterile technique using guaze dressing with aquacel strip on exit site. Pt denies fever or chills. Drive line site comparison from week to week below. Site not indurated, no tenderness, pain or warmth at the site. Dr. [name] came to see site after I cleansed the site--cultures obtained and put patient on Keflex 500 mg QID until he visits the clinic next week.      11/25/14      11/18/14             I reviewed the LVAD parameters from today, and compared the results to the patient's prior recorded data. No programming changes were made. The LVAD is  functioning within specified parameters.  LVAD interrogation was negative for any significant power changes or alarms; a few PI events/speed drops present which is consistent with the patient's history.   Pt/caregiver deny any alarms or VAD equipment issues. VAD coordinator reviewed daily log from home for daily temperature, weight, and VAD parameters. Pt is performing daily controller and system monitor self tests along with completing weekly and monthly maintenance for LVAD equipment.   LVAD equipment check completed and is in good working order. Back-up equipment present. LVAD education done on emergency procedures and precautions and reviewed exit site care.   F/U in 1 week with VAD Clinic and continue daily dressing changes as d/w Dr. Haroldine Laws and Dr. [name].  Janene Madeira, RN VAD Coordinator  Office: [phone] 24/7 VAD Pager: [phone]

## 2014-11-25 NOTE — Patient Instructions (Addendum)
1. Decrease your Midodrine dose to only 2.5 mg once a day instead of twice a day. Only take in the morning now since your blood pressure was a little high today at 96.   Since we changed your dose, be cautious going from sitting to standing 2. Take lasix if your weight is >176 for now. As you gain weight as you feel better and gain muscle back, we will eventually have to readjust this goal. 3. INR 1.89--Great!!  Next INR check next with your clinic visit   Sunday-4 mg (1 pill)  Monday-4 mg (1 pill)  Tuesday-4 mg (1 pill)  Wednesday-2 mg (1/2 pill)  Thursday-4 mg (1 pill)  Friday-2 mg (1/2 pill)  Saturday- 4 mg (1 pill) 4. Follow up in the clinic in 1 week.  5. We are starting you on Keflex today--500 mg (1 pill) four times a day until we see you next week. This should not impact your coumadin need so continue with the regimen as described above.  6. Continue with daily dressing changes and make sure you still use the silver ribbon. Call with any changes.

## 2014-11-26 NOTE — Progress Notes (Signed)
Patient ID: KEMAURI MUSA, male   DOB: 21-Mar-1946, 69 y.o.   MRN: [id number]  Primary Cardiologist: Dr Haroldine Laws INR : [facility]   [name] is a 69 y/o with h/o CAD with ankylosing spondylitis, DM2, carotid stenting, severe ischemic CM s/p anterior STEM with VT arrest in 12/15, large PE with pulmonary infarct in 2/15 s/p IVC filter and systolic HF with EF 84%.  He underwent HM II LVAD placement on 09/20/13. He had a prolonged hospital course due to recurrent chest bleeding, severe CO2 retention and RV failure.   Follow-up Presents today with his wife to clinic for weekly visit since hospital DC for VAD placement. He continues to improve. Now walking without the aid of a walker. Getting out of the house more. Appetite much better. Denies dyspnea or edema. Wife notices he is filling out.  . Reports that his dizziness has improved with the addition of Midodrine BID, however MAP elevated today at 96. No orthopnea or PND. Having trouble sleeping again.  No problems with VAD dressing. No bleeding, fevers, chills, melena, dark urine. Performing self test every day. Wife doing dressing changes.    LVAD interrogation reveals:  Speed: 9200  Flow: 5.4  Power: 5.8  PI: 5.0  Alarms: One "No External Power/Low Battery" alarm. He disconnected both cables this morning in an effort to untwist the lines  Events: 2 PI events in last 24 hours  Fixed speed: 9200  Low speed limit: 8600  Primary Controller: Replace back up battery in 27 months.  Back up controller: Replace back up battery in 27 month   SH:  History   Social History  . Marital Status: Married    Spouse Name: N/A  . Number of Children: N/A  . Years of Education: N/A   Occupational History  . Not on file.   Social History Main Topics  . Smoking status: Former Smoker    Quit date: 06/12/1973  . Smokeless tobacco: Not on file  . Alcohol Use: No  . Drug Use: No  . Sexual Activity: Not Currently   Other Topics Concern  .  Not on file   Social History Narrative    FH:  Family History  Problem Relation Age of Onset  . Hypertension Mother     Past Medical History  Diagnosis Date  . Hyperlipidemia   . Hypertension   . Diabetes   . Nephrolithiasis   . Ankylosing spondylitis   . CAD (coronary artery disease)     a. stenting of LCx 2013; b. STEMI 06/12/14 s/p PCI to LAD complicated by post cath shock requiring IABP; VT s/p DCCV, EF 20%; c. NSTEMI 06/26/14 treated medically.  . Ischemic cardiomyopathy     a. echo 08/23/2014 EF <20%, dilated CM, mod MR/TR  . Acute on chronic respiratory failure     a. 08/2014 in setting of PE.  . Bilateral pulmonary embolism     a. 08/2014 - started on Coumadin. Retrievable IVC filter placed 08/27/14 due to RV strain and large clot burden.  . Right leg DVT     a. 08/2014.  Marland Kitchen Chronic systolic CHF (congestive heart failure)   . Hypotension   . Hemoptysis     a. 08/2014 possibly due to pulm infarct/PE.  Marland Kitchen Pleural effusion on right 08/2014 - small  . Leukocytosis   . Carotid artery disease     a. s/p stenting.  . Ventricular tachycardia     a. 06/2014 at time of MI, s/p DCCV.  Marland Kitchen  Reactive thrombocytosis 09/10/2014  . Leukocytosis 09/10/2014    Current Outpatient Prescriptions  Medication Sig Dispense Refill  . acetaminophen (TYLENOL) 500 MG tablet Take 500 mg by mouth every 6 (six) hours as needed.    . budesonide-formoterol (SYMBICORT) 160-4.5 MCG/ACT inhaler Inhale 2 puffs into the lungs 2 (two) times daily. 1 Inhaler 12  . enoxaparin (LOVENOX) 40 MG/0.4ML injection Inject 0.4 mLs (40 mg total) into the skin daily. 10 Syringe 2  . furosemide (LASIX) 20 MG tablet Take 1 tablet (20 mg total) by mouth every other day. 30 tablet 6  . loperamide (IMODIUM A-D) 2 MG tablet Take 4 mg by mouth as needed for diarrhea or loose stools.    . metaxalone (SKELAXIN) 800 MG tablet Take 1 tablet (800 mg total) by mouth daily as needed for muscle spasms. 60 tablet 6  . midodrine  (PROAMATINE) 2.5 MG tablet Take 1 tablet (2.5 mg total) by mouth daily. 60 tablet 3  . Multiple Vitamin (MULTIVITAMIN) tablet Take 1 tablet by mouth daily.    . pantoprazole (PROTONIX) 40 MG tablet Take 1 tablet (40 mg total) by mouth at bedtime. 30 tablet 6  . potassium chloride SA (K-DUR,KLOR-CON) 20 MEQ tablet Take 20 mEq by mouth every other day.    . rosuvastatin (CRESTOR) 10 MG tablet Take 10 mg by mouth daily.    . traZODone (DESYREL) 50 MG tablet Take 1 tablet (50 mg total) by mouth at bedtime. 30 tablet 6  . warfarin (COUMADIN) 4 MG tablet Take 1 tablet (4 mg total) by mouth daily at 6 PM. Take 1 pill every day except 1/2 pill Wednesday and Fridays. Or otherwise prescribed. 30 tablet 6  . cephALEXin (KEFLEX) 500 MG capsule Take 1 capsule (500 mg total) by mouth 4 (four) times daily. 40 capsule 0   No current facility-administered medications for this encounter.     Vital signs: HR: 109 MAP BP: 84 (Drops to 68 on standing with PI in high 2s) O2 Sat: 95 Wt: 178.6 lbs  Last wt (hospital d/c) wt: 169 lbs Home wts: 167 - 172 lbs  Ht: 5'8"  PHYSICAL EXAM: General:  Well appearing.  No resp difficulty HEENT: normal Neck: limiter ROM JVP 8. Carotids 2+ bilaterally; no bruits. No lymphadenopathy or thryomegaly appreciated. Cor: RR + LVAD hum Lungs: clear  Abdomen: soft, nontender, nondistended. Driveline site with surrounding erythema Wound dressing in LUQ from chest tube.  Extremities: no cyanosis, clubbing, rash. 1+ edema Neuro: alert & orientedx3, cranial nerves grossly intact. Moves all 4 extremities w/o difficulty. Affect pleasant.   ASSESSMENT & PLAN: 1. Chronic systolic HF. EF 20% s/p HMII LVAD 09/20/13 --Doing very well post VAD. Volume status and MAPs slightly elevated. Will have him take extra lasix today and reviewed use of sliding scale diuretics.  --With elevated MAP. Decrease midodrine to 2.5 daily 2. CAD s/p Anterior STEMI on 06/12/14 with stenting of  LAD- -stable. continue statin 3. VAD --Interrogated personally. Parameters stable.  --Driveline site with superficial infection. Will start Keflex per Dr. [name]. 4. Ankylosing spondylitis. --off NSAIDS. Skelaxin as needed.  5. Bilateral P with pulmonary infarctE- S/P IVC filter 08/27/2014- continue coumadin 6. COPD/CO2 retention  -improved 7. Anticoagulation  --continue coumadin. Goal INR 1.8-2.2 due to bleeding. Off ASA.    Will check labs today and return in 1 week.   Finnlee Guarnieri, [place],MD 9:19 PM

## 2014-11-28 LAB — WOUND CULTURE: Culture: NO GROWTH

## 2014-12-01 ENCOUNTER — Other Ambulatory Visit (HOSPITAL_COMMUNITY): Payer: Self-pay | Admitting: Infectious Diseases

## 2014-12-01 DIAGNOSIS — Z7901 Long term (current) use of anticoagulants: Secondary | ICD-10-CM

## 2014-12-01 DIAGNOSIS — Z95811 Presence of heart assist device: Secondary | ICD-10-CM

## 2014-12-01 DIAGNOSIS — I255 Ischemic cardiomyopathy: Secondary | ICD-10-CM | POA: Diagnosis not present

## 2014-12-01 DIAGNOSIS — Z438 Encounter for attention to other artificial openings: Secondary | ICD-10-CM | POA: Diagnosis not present

## 2014-12-01 DIAGNOSIS — M459 Ankylosing spondylitis of unspecified sites in spine: Secondary | ICD-10-CM | POA: Diagnosis not present

## 2014-12-01 DIAGNOSIS — I2699 Other pulmonary embolism without acute cor pulmonale: Secondary | ICD-10-CM | POA: Diagnosis not present

## 2014-12-01 DIAGNOSIS — I2510 Atherosclerotic heart disease of native coronary artery without angina pectoris: Secondary | ICD-10-CM | POA: Diagnosis not present

## 2014-12-01 DIAGNOSIS — I5021 Acute systolic (congestive) heart failure: Secondary | ICD-10-CM | POA: Diagnosis not present

## 2014-12-02 ENCOUNTER — Encounter (HOSPITAL_COMMUNITY): Payer: Self-pay

## 2014-12-02 ENCOUNTER — Ambulatory Visit (HOSPITAL_COMMUNITY)
Admission: RE | Admit: 2014-12-02 | Discharge: 2014-12-02 | Disposition: A | Discharge status: Home / Self Care | Payer: Medicare Other | Source: Ambulatory Visit | Attending: Cardiology | Admitting: Cardiology

## 2014-12-02 ENCOUNTER — Telehealth (HOSPITAL_COMMUNITY): Payer: Self-pay | Admitting: Infectious Diseases

## 2014-12-02 ENCOUNTER — Ambulatory Visit (HOSPITAL_COMMUNITY): Payer: Self-pay | Admitting: Infectious Diseases

## 2014-12-02 VITALS — BP 88/0 | HR 93 | Ht 68.0 in | Wt 185.2 lb

## 2014-12-02 DIAGNOSIS — I5022 Chronic systolic (congestive) heart failure: Secondary | ICD-10-CM | POA: Insufficient documentation

## 2014-12-02 DIAGNOSIS — I82501 Chronic embolism and thrombosis of unspecified deep veins of right lower extremity: Secondary | ICD-10-CM | POA: Insufficient documentation

## 2014-12-02 DIAGNOSIS — G479 Sleep disorder, unspecified: Secondary | ICD-10-CM

## 2014-12-02 DIAGNOSIS — I2699 Other pulmonary embolism without acute cor pulmonale: Secondary | ICD-10-CM

## 2014-12-02 DIAGNOSIS — Z7901 Long term (current) use of anticoagulants: Secondary | ICD-10-CM | POA: Diagnosis not present

## 2014-12-02 DIAGNOSIS — Z95811 Presence of heart assist device: Secondary | ICD-10-CM

## 2014-12-02 DIAGNOSIS — M459 Ankylosing spondylitis of unspecified sites in spine: Secondary | ICD-10-CM | POA: Insufficient documentation

## 2014-12-02 DIAGNOSIS — I2510 Atherosclerotic heart disease of native coronary artery without angina pectoris: Secondary | ICD-10-CM | POA: Insufficient documentation

## 2014-12-02 DIAGNOSIS — L089 Local infection of the skin and subcutaneous tissue, unspecified: Secondary | ICD-10-CM

## 2014-12-02 DIAGNOSIS — I252 Old myocardial infarction: Secondary | ICD-10-CM | POA: Diagnosis not present

## 2014-12-02 DIAGNOSIS — J449 Chronic obstructive pulmonary disease, unspecified: Secondary | ICD-10-CM | POA: Insufficient documentation

## 2014-12-02 LAB — BASIC METABOLIC PANEL
ANION GAP: 9 (ref 5–15)
BUN: 10 mg/dL (ref 6–20)
CALCIUM: 8.9 mg/dL (ref 8.9–10.3)
CO2: 24 mmol/L (ref 22–32)
CREATININE: 0.82 mg/dL (ref 0.61–1.24)
Chloride: 104 mmol/L (ref 101–111)
GFR calc non Af Amer: >60 mL/min (ref >60)
Glucose, Bld: 124 mg/dL — ABNORMAL HIGH (ref 65–99)
POTASSIUM: 4 mmol/L (ref 3.5–5.1)
SODIUM: 137 mmol/L (ref 135–145)

## 2014-12-02 LAB — MAGNESIUM: MAGNESIUM: 1.9 mg/dL (ref 1.7–2.4)

## 2014-12-02 LAB — CBC
HCT: 32.4 % — ABNORMAL LOW (ref 39.0–52.0)
Hemoglobin: 10.2 g/dL — ABNORMAL LOW (ref 13.0–17.0)
MCH: 29.1 pg (ref 26.0–34.0)
MCHC: 31.5 g/dL (ref 30.0–36.0)
MCV: 92.3 fL (ref 78.0–100.0)
Platelets: 418 10*3/uL — ABNORMAL HIGH (ref 150–400)
RBC: 3.51 MIL/uL — ABNORMAL LOW (ref 4.22–5.81)
RDW: 17.7 % — ABNORMAL HIGH (ref 11.5–15.5)
WBC: 15 10*3/uL — AB (ref 4.0–10.5)

## 2014-12-02 LAB — LACTATE DEHYDROGENASE: LDH: 247 U/L — ABNORMAL HIGH (ref 98–192)

## 2014-12-02 LAB — PROTIME-INR
INR: 2.03 — ABNORMAL HIGH (ref 0.00–1.49)
Prothrombin Time: 22.8 seconds — ABNORMAL HIGH (ref 11.6–15.2)

## 2014-12-02 MED ORDER — CEPHALEXIN 500 MG PO CAPS: 500.0000 mg | ORAL_CAPSULE | Freq: Three times a day (TID) | ORAL | Status: DC

## 2014-12-02 MED ORDER — TRAZODONE HCL 100 MG PO TABS
100.0000 mg | ORAL_TABLET | Freq: Every day | ORAL | Status: DC
Start: 2014-12-02 — End: 2015-07-12

## 2014-12-02 NOTE — Progress Notes (Signed)
Patient ID: Scott [name], male   DOB: 1945-08-26, 69 y.o.   MRN: [id number]  Primary Cardiologist: Dr Haroldine Laws INR : [facility] Scott  HPI: Scott [name] is a 69 y/o with h/o CAD with ankylosing spondylitis, DM2, carotid stenting, severe ischemic CM s/p anterior STEM with VT arrest in 12/15, large PE with pulmonary infarct in 2/15 s/p IVC filter and systolic HF with EF 63%.  He underwent HM II LVAD placement on 09/20/13. He had a prolonged hospital course due to recurrent chest bleeding, severe CO2 retention and RV failure.  Follow-up for LVAD  Last visit midodrine was cut back due to elevated MAP. He was also started on keflex for superficial drive line infection. Overall feeling much better. Denies SOB/PND/Orthopnea. No bleeding problems. Want to start cardiac rehab. Weight at home 174-177 pounds. Appetite improved.   No problems with VAD dressing. No bleeding, fevers, chills, melena, dark urine. Performing self test every day. Wife doing dressing changes.   LVAD interrogation reveals:  Speed: 9200  Flow: 5.4  Power: 5.8  PI: 5.0  Alarms: One "No External Power/Low Battery" alarm. He disconnected both cables this morning in an effort to untwist the lines  Events: 2 PI events in last 24 hours  Fixed speed: 9200  Low speed limit: 8600  Primary Controller: Replace back up battery in 27 months.  Back up controller: Replace back up battery in 27 month   SH:  History   Social History  . Marital Status: Married    Spouse Name: N/A  . Number of Children: N/A  . Years of Education: N/A   Occupational History  . Not on file.   Social History Main Topics  . Smoking status: Former Smoker    Quit date: 06/12/1973  . Smokeless tobacco: Not on file  . Alcohol Use: No  . Drug Use: No  . Sexual Activity: Not Currently   Other Topics Concern  . Not on file   Social History Narrative    FH:  Family History  Problem Relation Age of Onset  . Hypertension Mother     Past Medical  History  Diagnosis Date  . Hyperlipidemia   . Hypertension   . Diabetes   . Nephrolithiasis   . Ankylosing spondylitis   . CAD (coronary artery disease)     a. stenting of LCx 2013; b. STEMI 06/12/14 s/p PCI to LAD complicated by post cath shock requiring IABP; VT s/p DCCV, EF 20%; c. NSTEMI 06/26/14 treated medically.  . Ischemic cardiomyopathy     a. echo 08/23/2014 EF <20%, dilated CM, mod MR/TR  . Acute on chronic respiratory failure     a. 08/2014 in setting of PE.  . Bilateral pulmonary embolism     a. 08/2014 - started on Coumadin. Retrievable IVC filter placed 08/27/14 due to RV strain and large clot burden.  . Right leg DVT     a. 08/2014.  Marland Kitchen Chronic systolic CHF (congestive heart failure)   . Hypotension   . Hemoptysis     a. 08/2014 possibly due to pulm infarct/PE.  Marland Kitchen Pleural effusion on right 08/2014 - small  . Leukocytosis   . Carotid artery disease     a. s/p stenting.  . Ventricular tachycardia     a. 06/2014 at time of MI, s/p DCCV.  Marland Kitchen Reactive thrombocytosis 09/10/2014  . Leukocytosis 09/10/2014    Current Outpatient Prescriptions  Medication Sig Dispense Refill  . acetaminophen (TYLENOL) 500 MG tablet Take 500 mg by mouth every  6 (six) hours as needed.    . budesonide-formoterol (SYMBICORT) 160-4.5 MCG/ACT inhaler Inhale 2 puffs into the lungs 2 (two) times daily. 1 Inhaler 12  . cephALEXin (KEFLEX) 500 MG capsule Take 1 capsule (500 mg total) by mouth 4 (four) times daily. 40 capsule 0  . enoxaparin (LOVENOX) 40 MG/0.4ML injection Inject 0.4 mLs (40 mg total) into the skin daily. 10 Syringe 2  . furosemide (LASIX) 20 MG tablet Take 1 tablet (20 mg total) by mouth every other day. 30 tablet 6  . loperamide (IMODIUM A-D) 2 MG tablet Take 4 mg by mouth as needed for diarrhea or loose stools.    . metaxalone (SKELAXIN) 800 MG tablet Take 1 tablet (800 mg total) by mouth daily as needed for muscle spasms. 60 tablet 6  . midodrine (PROAMATINE) 2.5 MG tablet Take 1 tablet  (2.5 mg total) by mouth daily. 60 tablet 3  . Multiple Vitamin (MULTIVITAMIN) tablet Take 1 tablet by mouth daily.    . pantoprazole (PROTONIX) 40 MG tablet Take 1 tablet (40 mg total) by mouth at bedtime. 30 tablet 6  . potassium chloride SA (K-DUR,KLOR-CON) 20 MEQ tablet Take 20 mEq by mouth every other day.    . rosuvastatin (CRESTOR) 10 MG tablet Take 10 mg by mouth daily.    . traZODone (DESYREL) 50 MG tablet Take 1 tablet (50 mg total) by mouth at bedtime. 30 tablet 6  . warfarin (COUMADIN) 4 MG tablet Take 1 tablet (4 mg total) by mouth daily at 6 PM. Take 1 pill every day except 1/2 pill Wednesday and Fridays. Or otherwise prescribed. 30 tablet 6   No current facility-administered medications for this encounter.     Vital signs: HR: 93 MAP BP: 88 O2 Sat: 97 Wt: 185 lbs   PHYSICAL EXAM: MAP 88 General:  Well appearing.  No resp difficulty. Wife present  HEENT: normal Neck: limiter ROM JVP 5-6. Carotids 2+ bilaterally; no bruits. No lymphadenopathy or thryomegaly appreciated. Cor: RR + LVAD hum Lungs: clear  Abdomen: soft, nontender, nondistended. Driveline site with surrounding erythema. No exudate from driveline.  Extremities: no cyanosis, clubbing, rash. Trace  edema Neuro: alert & orientedx3, cranial nerves grossly intact. Moves all 4 extremities w/o difficulty. Affect pleasant.   ASSESSMENT & PLAN: 1. Chronic systolic HF. EF 20% s/p HMII LVAD 09/20/13 --NYHA II. Volume status stable. Continue current diuretic regimen.  Continue current plan. MAP improved. idodrine to 2.5 daily 2. CAD s/p Anterior STEMI on 06/12/14 with stenting of LAD- -stable. continue statin 3.S/P LVAD HMII for DT 09/20/2013  --Interrogated personally. Parameters stable.  --Driveline site with superficial infection improved. Completing course of keflex. WBC coming down. LDH trending down. Refer to Outpatient Rehab in [place].  4. Ankylosing spondylitis. --off NSAIDS. Skelaxin as needed.   5. Bilateral PE with pulmonary infarct - S/P IVC filter 08/27/2014- continue coumadin 6. COPD/CO2 retention  -improved 7. Anticoagulation  --continue coumadin. Goal INR 1.8-2.2 due to bleeding. Off ASA due to post operative bleed in pleural space. See anticoagulation flow sheet.  8. RLE DVT- on coumadin.    Will check labs today and return in 1 week.   Hill Mackie, NP-C  9:43 AM

## 2014-12-02 NOTE — Patient Instructions (Signed)
1. Your site looks much better today. Continue using the silver ribbon and you can drop down to every other day with dressings now. If you see any changes that look like infection go back to daily dressings and notify us please.  2. No changes to your medications today aside from the Trazodone 100 mg nightly now to help with your sleep.  3. INR 2.0 today so no changes to your coumadin. Continue weekly regimen.  4. I will call you with the rest of your lab work today when it results.  5. Finish your Keflex (antibiotic) and we will see you in 1 week.

## 2014-12-02 NOTE — Telephone Encounter (Signed)
Instructed Scott [name] to continue the Keflex 500 mg doses but to drop down to TID from QID. Continue until we see him next week. Verbalized understanding. Informed her of the results of his other lab work that we performed today being WNL.

## 2014-12-02 NOTE — Progress Notes (Addendum)
Symptom  Yes  No  Details   Angina        x Activity:   Claudication        x How far:   Syncope        x When: Dizziness with orthostatic changes has improved since starting midodrine.   Stroke        x   Orthopnea        x How many pillows:  1  PND        x How often:  BiPAP         x    How many hrs:  All night (6 - 7 hours)  Pedal edema               x   Abd fullness        x   N&V        x Improving appetite  Diaphoresis        x When:  Bleeding       x   Urine color   Light - medium yellow  SOB         x  Activity: improving   Palpitations         x When:  ICD shock       N/A   Hospitlizaitons                x When/where/why:    ED visit         x When/where/why:  Other MD         x When/who/why:  Activity    Increasing activities and reps on exercise. PT once weekly--Completed Home PT--Jane to contact outpatient Cardiac Rehab with [facility].   Fluid    2000  Diet    Low sodium   Vital signs: HR:  93 MAP BP: 88 doppler RUE Automatic BP: not able to obtain RUE or LUE O2 Sat: 97% Wt:  185.2 lbs  Last wt:  184 lbs Home wts: 174 - 177 lbs  Ht:  5'8"   LVAD interrogation reveals:  Speed:  9200 Flow:  5.3 Power: 5.6 PI: 5.2 Alarms: No alarms since last visit Events: no events since last visit   Fixed speed:  9200  Low speed limit: 8600  Primary Controller: Replace back up battery in 27 months. Back up controller:  Replace back up battery in 27 months.   LVAD interrogation negative for any power spikes, speed drops/PI events. No alarms recorded on event log and none reported by patient/caregiver. Back up equipment is present. No adjustments were made to prescribed settings.   Log book reviewed from home and WNL for patient's baseline.   Encounter Details:  Presents today with his wife to clinic for visit s/p hospital DC for VAD placement. No walker needed upon walking to clinic today and gait looks strong and steady. Been slowly increasing his time out of the  house and activity; reports that he still gets winded with activity, however he had that even prior to his experiences with heart failure.   Appetite is normal. Reports that his dizziness has been unchanged since dropping Midodrine dose last visit; no PI events and no adverse effects noted with dose change.   Reporting some pocket pain related to eating dinner in the evening. Relieved by lying down or sitting in the recliner. Not interested in taking pain medication at this time but will let us know if this worsens.   Sleeping  quality has decreased associated to inability to stay asleep. Requesting a dose increase of his Trazodone since he was previously on 100 mg at home prior to the LVAD surgery. D/W Amy and Pharmacy team and will increase dose to 100 mg QHS.   Tolerating current wearables with equipment and is becoming more comfortable with living with equipment.   LVAD exit site:     VAD dressing removed and site care performed using sterile technique. Drive line exit site cleaned with Chlora prep applicators x 2, allowed to dry, and gauze dressing with aquacel strip re-applied. Exit site with good tissue ingrowth noted, however still completing a second course of antibiotics. Improved amount of crusts noted around drive line and were removed with cleaning. Decreased erythema noted to the site compared to last weekand appears to be healing like other CT sites (darker red/purple area encircling site). No tenderness, drainage, induration, warmth or foul odor noted.WBC still elevated but decreasing.   Driveline dressing is being changed daily per sterile technique using guaze dressing with aquacel strip on exit site. Decrease the frequency of dressings to every other day with silver ribbon.   Seen with Darrick Grinder, NP-C and will F/U in 1 week with VAD Clinic to check site in hopes to drop back to Q3d for dressings if he continues to improve.   Janene Madeira, RN VAD Coordinator  Office:  [phone] 24/7 VAD Pager: [phone]

## 2014-12-07 DIAGNOSIS — H25019 Cortical age-related cataract, unspecified eye: Secondary | ICD-10-CM | POA: Diagnosis not present

## 2014-12-07 DIAGNOSIS — H2510 Age-related nuclear cataract, unspecified eye: Secondary | ICD-10-CM | POA: Diagnosis not present

## 2014-12-08 ENCOUNTER — Other Ambulatory Visit (HOSPITAL_COMMUNITY): Payer: Self-pay | Admitting: *Deleted

## 2014-12-08 ENCOUNTER — Ambulatory Visit (HOSPITAL_COMMUNITY): Payer: Self-pay | Admitting: *Deleted

## 2014-12-08 ENCOUNTER — Ambulatory Visit (HOSPITAL_COMMUNITY)
Admission: RE | Admit: 2014-12-08 | Discharge: 2014-12-08 | Disposition: A | Discharge status: Home / Self Care | Payer: Medicare Other | Source: Ambulatory Visit | Attending: Internal Medicine | Admitting: Internal Medicine

## 2014-12-08 VITALS — BP 105/73 | HR 81 | Ht 68.0 in | Wt 185.4 lb

## 2014-12-08 DIAGNOSIS — R0600 Dyspnea, unspecified: Secondary | ICD-10-CM

## 2014-12-08 DIAGNOSIS — I2510 Atherosclerotic heart disease of native coronary artery without angina pectoris: Secondary | ICD-10-CM | POA: Diagnosis not present

## 2014-12-08 DIAGNOSIS — Z7901 Long term (current) use of anticoagulants: Secondary | ICD-10-CM | POA: Diagnosis not present

## 2014-12-08 DIAGNOSIS — Z95811 Presence of heart assist device: Secondary | ICD-10-CM | POA: Diagnosis not present

## 2014-12-08 DIAGNOSIS — I5022 Chronic systolic (congestive) heart failure: Secondary | ICD-10-CM

## 2014-12-08 LAB — CBC
HCT: 35.9 % — ABNORMAL LOW (ref 39.0–52.0)
Hemoglobin: 11 g/dL — ABNORMAL LOW (ref 13.0–17.0)
MCH: 28.6 pg (ref 26.0–34.0)
MCHC: 30.6 g/dL (ref 30.0–36.0)
MCV: 93.5 fL (ref 78.0–100.0)
Platelets: 413 10*3/uL — ABNORMAL HIGH (ref 150–400)
RBC: 3.84 MIL/uL — ABNORMAL LOW (ref 4.22–5.81)
RDW: 17.5 % — ABNORMAL HIGH (ref 11.5–15.5)
WBC: 12 10*3/uL — ABNORMAL HIGH (ref 4.0–10.5)

## 2014-12-08 LAB — BASIC METABOLIC PANEL
Anion gap: 8 (ref 5–15)
BUN: 14 mg/dL (ref 6–20)
CO2: 27 mmol/L (ref 22–32)
CREATININE: 0.87 mg/dL (ref 0.61–1.24)
Calcium: 9.1 mg/dL (ref 8.9–10.3)
Chloride: 103 mmol/L (ref 101–111)
Glucose, Bld: 111 mg/dL — ABNORMAL HIGH (ref 65–99)
POTASSIUM: 4.3 mmol/L (ref 3.5–5.1)
Sodium: 138 mmol/L (ref 135–145)

## 2014-12-08 LAB — BRAIN NATRIURETIC PEPTIDE: B NATRIURETIC PEPTIDE 5: 939.8 pg/mL — AB (ref 0.0–100.0)

## 2014-12-08 LAB — LACTATE DEHYDROGENASE: LDH: 250 U/L — ABNORMAL HIGH (ref 98–192)

## 2014-12-08 LAB — PROTIME-INR
INR: 1.71 — ABNORMAL HIGH (ref 0.00–1.49)
PROTHROMBIN TIME: 20.1 s — AB (ref 11.6–15.2)

## 2014-12-08 LAB — MAGNESIUM: Magnesium: 2 mg/dL (ref 1.7–2.4)

## 2014-12-08 NOTE — Patient Instructions (Signed)
1.  Increase coumadin to 4 mg daily except 2 mg on Friday. Will re-check INR at next clinic visit.  2.  Advance dressing changes to every 3 - 4 days. 3.  Return to [place] clinic two weeks.

## 2014-12-08 NOTE — Progress Notes (Addendum)
Symptom  Yes  No  Details   Angina        x Activity:   Claudication        x How far:   Syncope        x When: Dizziness with orthostatic changes has improved since starting midodrine.   Stroke        x   Orthopnea        x How many pillows:  1  PND        x How often:  BiPAP         x    How many hrs:  All night (5 - 6  hours)  Pedal edema               x   Abd fullness        x   N&V        x Improving appetite  Diaphoresis        x When:  Bleeding       x   Urine color   Light - medium yellow  SOB         x  Activity: improving   Palpitations         x When:  ICD shock       N/A   Hospitlizaitons                x When/where/why:    ED visit         x When/where/why:  Other MD         x When/who/why:  Activity    Increasing; plan to start cardiac rehab next week   Fluid    2000  Diet    Low sodium   Vital signs: HR:  81 MAP BP:  84 Automatic BP:  105/73 (82) O2 Sat: 95% Wt:  185.4 lbs  Last wt:  1845 bs Home wts: 177 - 178 lbs  Ht:  5'8"   LVAD interrogation reveals:  Speed:  9200 Flow:  4.4 Power: 5.1 PI: 5.4 Alarms: No alarms since last visit Events: one PI event  Fixed speed:  9200  Low speed limit: 8600  Primary Controller: Replace back up battery in 26 months. Back up controller:  Replace back up battery in 26 months.   LVAD interrogation negative for any power spikes, speed drops/PI events. No alarms recorded on event log and none reported by patient/caregiver. Back up equipment is present. No adjustments were made to prescribed settings.   Log book reviewed from home and WNL for patient's baseline.    LVAD exit site:   VAD dressing removed and site care performed using sterile technique. Drive line exit site cleaned with Chlora prep applicators x 2, allowed to dry, and gauze dressing with aquacel strip re-applied. Exit site with complete tissue ingrowth, has three more days of second course of Keflex.  Decreased erythema noted, no tenderness, drainage,  or foul odor noted. Will advance dressing changes to twice weekly. Dressings provided for home use.       Pt will: 1.  Increase coumadin to 4 mg daily except 2 mg on Friday. Will re-check INR at next clinic visit.  2.  Advance dressing changes to every 3 - 4 days. 3.  Return to [place] clinic two weeks.

## 2014-12-08 NOTE — Progress Notes (Signed)
Patient ID: EMEKA LINDNER, male   DOB: Mar 06, 1946, 69 y.o.   MRN: [id number]  Primary Cardiologist: Dr Haroldine Laws INR : [facility]  [name] is a 69 y/o with h/o CAD with ankylosing spondylitis, DM2, carotid stenting, severe ischemic CM s/p anterior STEM with VT arrest in 12/15, large PE with pulmonary infarct in 2/15 s/p IVC filter and systolic HF with EF 83%.  He underwent HM II LVAD placement on 09/20/13. He had a prolonged hospital course due to recurrent chest bleeding, severe CO2 retention and RV failure.  Follow-up Presents today with his wife to clinic for weekly visit since hospital DC for VAD placement. He continues to improve. Feeling better and better. Able to ambulate moe. Appetite is great.  Getting out of the house more. Denies dyspnea or edema. Still with arthritic pain. Using Bipap 3-4 hours per night. No edema. Dizziness resolved.   No problems with VAD dressing. No bleeding, fevers, chills, melena, dark urine. Performing self test every day. Wife doing dressing changes.    LVAD interrogation reveals:  Speed: 9200 Flow: 4.4 Power: 5.1 PI: 5.4 Alarms: No alarms since last visit Events: one PI event  Fixed speed: 9200  Low speed limit: 8600  Primary Controller: Replace back up battery in 26 months. Back up controller: Replace back up battery in 26 months.  SH:  History   Social History  . Marital Status: Married    Spouse Name: N/A  . Number of Children: N/A  . Years of Education: N/A   Occupational History  . Not on file.   Social History Main Topics  . Smoking status: Former Smoker    Quit date: 06/12/1973  . Smokeless tobacco: Not on file  . Alcohol Use: No  . Drug Use: No  . Sexual Activity: Not Currently   Other Topics Concern  . Not on file   Social History Narrative    FH:  Family History  Problem Relation Age of Onset  . Hypertension Mother     Past Medical History  Diagnosis Date  . Hyperlipidemia   . Hypertension    . Diabetes   . Nephrolithiasis   . Ankylosing spondylitis   . CAD (coronary artery disease)     a. stenting of LCx 2013; b. STEMI 06/12/14 s/p PCI to LAD complicated by post cath shock requiring IABP; VT s/p DCCV, EF 20%; c. NSTEMI 06/26/14 treated medically.  . Ischemic cardiomyopathy     a. echo 08/23/2014 EF <20%, dilated CM, mod MR/TR  . Acute on chronic respiratory failure     a. 08/2014 in setting of PE.  . Bilateral pulmonary embolism     a. 08/2014 - started on Coumadin. Retrievable IVC filter placed 08/27/14 due to RV strain and large clot burden.  . Right leg DVT     a. 08/2014.  Marland Kitchen Chronic systolic CHF (congestive heart failure)   . Hypotension   . Hemoptysis     a. 08/2014 possibly due to pulm infarct/PE.  Marland Kitchen Pleural effusion on right 08/2014 - small  . Leukocytosis   . Carotid artery disease     a. s/p stenting.  . Ventricular tachycardia     a. 06/2014 at time of MI, s/p DCCV.  Marland Kitchen Reactive thrombocytosis 09/10/2014  . Leukocytosis 09/10/2014    Current Outpatient Prescriptions  Medication Sig Dispense Refill  . acetaminophen (TYLENOL) 500 MG tablet Take 500 mg by mouth every 6 (six) hours as needed.    . cephALEXin (KEFLEX) 500 MG  capsule Take 1 capsule (500 mg total) by mouth 3 (three) times daily. 10 capsule 0  . furosemide (LASIX) 20 MG tablet Take 1 tablet (20 mg total) by mouth every other day. (Patient taking differently: Take 20 mg by mouth every other day. Take if wt equal to or greater than 176 lbs) 30 tablet 6  . metaxalone (SKELAXIN) 800 MG tablet Take 1 tablet (800 mg total) by mouth daily as needed for muscle spasms. 60 tablet 6  . midodrine (PROAMATINE) 2.5 MG tablet Take 1 tablet (2.5 mg total) by mouth daily. 60 tablet 3  . Multiple Vitamin (MULTIVITAMIN) tablet Take 1 tablet by mouth daily.    . pantoprazole (PROTONIX) 40 MG tablet Take 1 tablet (40 mg total) by mouth at bedtime. 30 tablet 6  . potassium chloride SA (K-DUR,KLOR-CON) 20 MEQ tablet Take 20 mEq  by mouth every other day.    . rosuvastatin (CRESTOR) 10 MG tablet Take 10 mg by mouth daily.    . traZODone (DESYREL) 100 MG tablet Take 1 tablet (100 mg total) by mouth at bedtime. 30 tablet 6  . warfarin (COUMADIN) 4 MG tablet Take 1 tablet (4 mg total) by mouth daily at 6 PM. Take 1 pill every day except 1/2 pill Wednesday and Fridays. Or otherwise prescribed. 30 tablet 6  . enoxaparin (LOVENOX) 40 MG/0.4ML injection Inject 0.4 mLs (40 mg total) into the skin daily. (Patient not taking: Reported on 12/02/2014) 10 Syringe 2  . loperamide (IMODIUM A-D) 2 MG tablet Take 4 mg by mouth as needed for diarrhea or loose stools.     No current facility-administered medications for this encounter.     Vital signs: HR: 81 MAP BP: 84 Automatic BP: 105/73 (82) O2 Sat: 95% Wt: 185.4 lbs  Last wt: 184.5 bs Home wts: 177 - 178 lbs  Ht: 5'8"   PHYSICAL EXAM: General:  Well appearing.  No resp difficulty HEENT: normal Neck: limited ROM JVP 7. Carotids 2+ bilaterally; no bruits. No lymphadenopathy or thryomegaly appreciated. Cor: RR + LVAD hum Lungs: clear  Abdomen: soft, nontender, nondistended. Driveline site clear Extremities: no cyanosis, clubbing, rash. tr edema Neuro: alert & orientedx3, cranial nerves grossly intact. Moves all 4 extremities w/o difficulty. Affect pleasant.   ASSESSMENT & PLAN: 1. Chronic systolic HF. EF 20% s/p HMII LVAD 09/20/13 --Doing very well post VAD. Volume status and MAPs much improved.  .  --Continue midodrine 2.5 daily 2. CAD s/p Anterior STEMI on 06/12/14 with stenting of LAD- -stable. continue statin 3. VAD --Interrogated personally. Parameters stable.  --Driveline site looks good 4. Ankylosing spondylitis. --off NSAIDS. Skelaxin as needed.  5. Bilateral P with pulmonary infarctE- S/P IVC filter 08/27/2014- continue coumadin 6. COPD/CO2 retention  -improved. Continue BiPAP 7. Anticoagulation  --continue coumadin. Goal INR 1.8-2.2 due to  bleeding. Off ASA.    Will check labs today and return in 1 week.   Bensimhon, Kilian,MD 6:41 PM

## 2014-12-13 ENCOUNTER — Other Ambulatory Visit: Payer: Self-pay | Admitting: *Deleted

## 2014-12-13 ENCOUNTER — Encounter: Payer: Medicare Other | Attending: Internal Medicine | Admitting: *Deleted

## 2014-12-13 VITALS — Ht 68.5 in | Wt 186.7 lb

## 2014-12-13 DIAGNOSIS — Z95811 Presence of heart assist device: Secondary | ICD-10-CM | POA: Diagnosis not present

## 2014-12-13 DIAGNOSIS — I509 Heart failure, unspecified: Secondary | ICD-10-CM | POA: Insufficient documentation

## 2014-12-13 DIAGNOSIS — I213 ST elevation (STEMI) myocardial infarction of unspecified site: Secondary | ICD-10-CM

## 2014-12-13 DIAGNOSIS — Z9861 Coronary angioplasty status: Secondary | ICD-10-CM

## 2014-12-13 DIAGNOSIS — I252 Old myocardial infarction: Secondary | ICD-10-CM | POA: Insufficient documentation

## 2014-12-13 DIAGNOSIS — Z955 Presence of coronary angioplasty implant and graft: Secondary | ICD-10-CM | POA: Diagnosis not present

## 2014-12-13 NOTE — Progress Notes (Signed)
Cardiac Individual Treatment Plan  Patient Details  Name: Scott [name] MRN: [id number] Date of Birth: 10-Apr-1946 Referring Provider:  [name], MD  Initial Encounter Date: Date: 12/13/14  Visit Diagnosis: ST elevation myocardial infarction (STEMI), unspecified artery  S/P PTCA (percutaneous transluminal coronary angioplasty)  LVAD (left ventricular assist device) present  Patient's Home Medications on Admission:  Current outpatient prescriptions:  .  acetaminophen (TYLENOL) 500 MG tablet, Take 500 mg by mouth every 6 (six) hours as needed., Disp: , Rfl:  .  furosemide (LASIX) 20 MG tablet, Take 1 tablet (20 mg total) by mouth every other day. (Patient taking differently: Take 20 mg by mouth every other day. Take if wt equal to or greater than 176 lbs), Disp: 30 tablet, Rfl: 6 .  metaxalone (SKELAXIN) 800 MG tablet, Take 1 tablet (800 mg total) by mouth daily as needed for muscle spasms., Disp: 60 tablet, Rfl: 6 .  midodrine (PROAMATINE) 2.5 MG tablet, Take 1 tablet (2.5 mg total) by mouth daily., Disp: 60 tablet, Rfl: 3 .  Multiple Vitamin (MULTIVITAMIN) tablet, Take 1 tablet by mouth daily., Disp: , Rfl:  .  pantoprazole (PROTONIX) 40 MG tablet, Take 1 tablet (40 mg total) by mouth at bedtime., Disp: 30 tablet, Rfl: 6 .  potassium chloride SA (K-DUR,KLOR-CON) 20 MEQ tablet, Take 20 mEq by mouth every other day., Disp: , Rfl:  .  rosuvastatin (CRESTOR) 10 MG tablet, Take 10 mg by mouth daily., Disp: , Rfl:  .  traZODone (DESYREL) 100 MG tablet, Take 1 tablet (100 mg total) by mouth at bedtime., Disp: 30 tablet, Rfl: 6 .  warfarin (COUMADIN) 4 MG tablet, Take 1 tablet (4 mg total) by mouth daily at 6 PM. Take 1 pill every day except 1/2 pill Wednesday and Fridays. Or otherwise prescribed., Disp: 30 tablet, Rfl: 6 .  cephALEXin (KEFLEX) 500 MG capsule, Take 1 capsule (500 mg total) by mouth 3 (three) times daily. (Patient not taking: Reported on 12/13/2014), Disp: 10  capsule, Rfl: 0 .  enoxaparin (LOVENOX) 40 MG/0.4ML injection, Inject 0.4 mLs (40 mg total) into the skin daily. (Patient not taking: Reported on 12/02/2014), Disp: 10 Syringe, Rfl: 2 .  loperamide (IMODIUM A-D) 2 MG tablet, Take 4 mg by mouth as needed for diarrhea or loose stools., Disp: , Rfl:   Past Medical History: Past Medical History  Diagnosis Date  . Hyperlipidemia   . Hypertension   . Diabetes   . Nephrolithiasis   . Ankylosing spondylitis   . CAD (coronary artery disease)     a. stenting of LCx 2013; b. STEMI 06/12/14 s/p PCI to LAD complicated by post cath shock requiring IABP; VT s/p DCCV, EF 20%; c. NSTEMI 06/26/14 treated medically.  . Ischemic cardiomyopathy     a. echo 08/23/2014 EF <20%, dilated CM, mod MR/TR  . Acute on chronic respiratory failure     a. 08/2014 in setting of PE.  . Bilateral pulmonary embolism     a. 08/2014 - started on Coumadin. Retrievable IVC filter placed 08/27/14 due to RV strain and large clot burden.  . Right leg DVT     a. 08/2014.  Marland Kitchen Chronic systolic CHF (congestive heart failure)   . Hypotension   . Hemoptysis     a. 08/2014 possibly due to pulm infarct/PE.  Marland Kitchen Pleural effusion on right 08/2014 - small  . Leukocytosis   . Carotid artery disease     a. s/p stenting.  . Ventricular tachycardia  a. 06/2014 at time of MI, s/p DCCV.  Marland Kitchen Reactive thrombocytosis 09/10/2014  . Leukocytosis 09/10/2014    Tobacco Use: History  Smoking status  . Former Smoker  . Quit date: 06/12/1973  Smokeless tobacco  . Not on file    Labs: Recent Review Flowsheet Data    Labs for ITP Cardiac and Pulmonary Rehab Latest Ref Rng 10/12/2014 10/12/2014 10/12/2014 10/13/2014 10/14/2014   PHART 7.350 - 7.450 - 7.335(L) - - -   PCO2ART 35.0 - 45.0 mmHg - 68.6(HH) - - -   HCO3 20.0 - 24.0 mEq/L - 36.6(H) - - -   TCO2 0 - 100 mmol/L - 39 31 34 -   O2SAT - 76.1 99.0 - 86.9 87.0       Exercise Target Goals: Date: 12/13/14  Exercise Program Goal: Individual  exercise prescription set with THRR, safety & activity barriers. Participant demonstrates ability to understand and report RPE using BORG scale, to self-measure pulse accurately, and to acknowledge the importance of the exercise prescription.  Exercise Prescription Goal: Starting with aerobic activity 30 plus minutes a day, 3 days per week for initial exercise prescription. Provide home exercise prescription and guidelines that participant acknowledges understanding prior to discharge.  Activity Barriers & Risk Stratification:     Activity Barriers & Risk Stratification - 12/13/14 1405    Activity Barriers & Risk Stratification   Activity Barriers Arthritis;Back Problems;Neck/Spine Problems   Risk Stratification High      6 Minute Walk:     6 Minute Walk      12/13/14 1516       6 Minute Walk   Phase Initial     Distance 735 feet     Walk Time 6 minutes     Resting HR 97 bpm     Max Ex. HR 112 bpm     RPE 11     Symptoms No        Initial Exercise Prescription:     Initial Exercise Prescription - 12/13/14 1500    Date of Initial Exercise Prescription   Date 12/13/14   Treadmill   MPH 1   Grade 0   Minutes 10   Bike   Level 0.4   Minutes 15   Recumbant Bike   Level 2   Watts 20   Minutes 15   NuStep   Level 2   Watts 30   Minutes 15   Arm Ergometer   Level 1   Watts 5   Minutes 10   Arm/Foot Ergometer   Level 1   Watts 8   Minutes 15   Cybex   Level 1   RPM 30   Minutes 15   Recumbant Elliptical   Level 1   Watts 15   Minutes 15   REL-XR   Level 2   Watts 20   Minutes 15   Prescription Details   Frequency (times per week) 3   Duration Progress to 30 minutes of continuous aerobic without signs/symptoms of physical distress   Intensity   THRR REST +  20   Ratings of Perceived Exertion 11-13   Perceived Dyspnea 2-4   Progression Continue progressive overload as per policy without signs/symptoms or physical distress.   Resistance Training    Training Prescription Yes   Weight 1   Reps 10-12      Exercise Prescription Changes:   Discharge Exercise Prescription:   Nutrition:  Target Goals: Understanding of nutrition guidelines, daily intake of sodium <  1500mg , cholesterol 200mg , calories 30% from fat and 7% or less from saturated fats, daily to have 5 or more servings of fruits and vegetables.  Biometrics:     Pre Biometrics - 12/13/14 1523    Pre Biometrics   Height 5' 8.5" (1.74 m)   Weight 186 lb 11.2 oz (84.687 kg)   Waist Circumference 40 inches   Hip Circumference 42 inches   Waist to Hip Ratio 0.95 %   BMI (Calculated) 28       Nutrition Therapy Plan and Nutrition Goals:   Nutrition Discharge: Rate Your Plate Scores:   Nutrition Goals Re-Evaluation:   Psychosocial: Target Goals: Acknowledge presence or absence of depression, maximize coping skills, provide positive support system. Participant is able to verbalize types and ability to use techniques and skills needed for reducing stress and depression.  Initial Review & Psychosocial Screening:     Initial Psych Review & Screening - 12/13/14 1418    Initial Review   Current issues with Current Sleep Concerns   Family Dynamics   Good Support System? Yes   Comments Does have counselor with LVAD program.      Barriers   Psychosocial barriers to participate in program There are no identifiable barriers or psychosocial needs.;The patient should benefit from training in stress management and relaxation.   Screening Interventions   Interventions Encouraged to exercise      Quality of Life Scores:   PHQ-9:     Recent Review Flowsheet Data    Depression screen [facility] 2/9 12/13/2014   Decreased Interest 2   Down, Depressed, Hopeless 0   PHQ - 2 Score 2   Altered sleeping 2   Tired, decreased energy 3   Change in appetite 0   Feeling bad or failure about yourself  0   Trouble concentrating 0   Moving slowly or fidgety/restless 3    Suicidal thoughts 0   PHQ-9 Score 10   Difficult doing work/chores Very difficult      Psychosocial Evaluation and Intervention:   Psychosocial Re-Evaluation:   Vocational Rehabilitation: Provide vocational rehab assistance to qualifying candidates.   Vocational Rehab Evaluation & Intervention:     Vocational Rehab - 12/13/14 1407    Initial Vocational Rehab Evaluation & Intervention   Assessment shows need for Vocational Rehabilitation No      Education: Education Goals: Education classes will be provided on a weekly basis, covering required topics. Participant will state understanding/return demonstration of topics presented.  Learning Barriers/Preferences:     Learning Barriers/Preferences - 12/13/14 1406    Learning Barriers/Preferences   Learning Barriers Reading   Learning Preferences Video      Education Topics: General Nutrition Guidelines/Fats and Fiber: -Group instruction provided by verbal, written material, models and posters to present the general guidelines for heart healthy nutrition. Gives an explanation and review of dietary fats and fiber.   Controlling Sodium/Reading Food Labels: -Group verbal and written material supporting the discussion of sodium use in heart healthy nutrition. Review and explanation with models, verbal and written materials for utilization of the food label.   Exercise Physiology & Risk Factors: - Group verbal and written instruction with models to review the exercise physiology of the cardiovascular system and associated critical values. Details cardiovascular disease risk factors and the goals associated with each risk factor.   Aerobic Exercise & Resistance Training: - Gives group verbal and written discussion on the health impact of inactivity. On the components of aerobic and resistive training programs and  the benefits of this training and how to safely progress through these programs.   Flexibility, Balance, General  Exercise Guidelines: - Provides group verbal and written instruction on the benefits of flexibility and balance training programs. Provides general exercise guidelines with specific guidelines to those with heart or lung disease. Demonstration and skill practice provided.   Stress Management: - Provides group verbal and written instruction about the health risks of elevated stress, cause of high stress, and healthy ways to reduce stress.   Depression: - Provides group verbal and written instruction on the correlation between heart/lung disease and depressed mood, treatment options, and the stigmas associated with seeking treatment.   Anatomy & Physiology of the Heart: - Group verbal and written instruction and models provide basic cardiac anatomy and physiology, with the coronary electrical and arterial systems. Review of: AMI, Angina, Valve disease, Heart Failure, Cardiac Arrhythmia, Pacemakers, and the ICD.   Cardiac Procedures: - Group verbal and written instruction and models to describe the testing methods done to diagnose heart disease. Reviews the outcomes of the test results. Describes the treatment choices: Medical Management, Angioplasty, or Coronary Bypass Surgery.   Cardiac Medications: - Group verbal and written instruction to review commonly prescribed medications for heart disease. Reviews the medication, class of the drug, and side effects. Includes the steps to properly store meds and maintain the prescription regimen.   Go Sex-Intimacy & Heart Disease, Get SMART - Goal Setting: - Group verbal and written instruction through game format to discuss heart disease and the return to sexual intimacy. Provides group verbal and written material to discuss and apply goal setting through the application of the S.M.A.R.T. Method.   Other Matters of the Heart: - Provides group verbal, written materials and models to describe Heart Failure, Angina, Valve Disease, and Diabetes in the  realm of heart disease. Includes description of the disease process and treatment options available to the cardiac patient.   Exercise & Equipment Safety: - Individual verbal instruction and demonstration of equipment use and safety with use of the equipment.          Most Recent Value   Date  12/13/14   Educator  S [place] Crance   Instruction Review Code  2- meets goals/outcomes      Infection Prevention: - Provides verbal and written material to individual with discussion of infection control including proper hand washing and proper equipment cleaning during exercise session.      Most Recent Value   Date  12/13/14   Educator  S Doyl Bitting   Instruction Review Code  2- meets goals/outcomes      Falls Prevention: - Provides verbal and written material to individual with discussion of falls prevention and safety.      Most Recent Value   Date  12/13/14   Educator  S Aijalon Demuro   Instruction Review Code  2- meets goals/outcomes      Diabetes: - Individual verbal and written instruction to review signs/symptoms of diabetes, desired ranges of glucose level fasting, after meals and with exercise. Advice that pre and post exercise glucose checks will be done for 3 sessions at entry of program.    Knowledge Questionnaire Score:   Personal Goals and Risk Factors at Admission:     Personal Goals and Risk Factors at Admission - 12/13/14 1410    Personal Goals and Risk Factors on Admission    Weight Management No   Increase Aerobic Exercise and Physical Activity Yes   Intervention While in program, learn  and follow the exercise prescription taught. Start at a low level workload and increase workload after able to maintain previous level for 30 minutes. Increase time before increasing intensity.   Develop more efficient breathing techniques such as purse lipped breathing and diaphragmatic breathing; and practicing self-pacing with activity Yes   Intervention While in program, learn and utilize the  specific breathing techniques taught to you. Continue to practice and use the techniques as needed.   Diabetes No   Hypertension No   Lipids Yes   Goal Cholesterol controlled with medications as prescribed, with individualized exercise RX and with personalized nutrition plan. Value goals: LDL < 70mg , HDL > 40mg . Participant states understanding of desired cholesterol values and following prescriptions.   Intervention Provide nutrition & aerobic exercise along with prescribed medications to achieve LDL 70mg , HDL >40mg .      Personal Goals and Risk Factors Review:    Personal Goals Discharge:     Comments: Initial Evaluation

## 2014-12-13 NOTE — Patient Instructions (Signed)
Patient Instructions  Patient Details  Name: Scott [name] MRN: [id number] Date of Birth: 1946-03-25 Referring Provider:  [name], MD  Below are the personal goals you chose as well as exercise and nutrition goals. Our goal is to help you keep on track towards obtaining and maintaining your goals. We will be discussing your progress on these goals with you throughout the program.  Initial Exercise Prescription:     Initial Exercise Prescription - 12/13/14 1500    Date of Initial Exercise Prescription   Date 12/13/14   Treadmill   MPH 1   Grade 0   Minutes 10   Bike   Level 0.4   Minutes 15   Recumbant Bike   Level 2   Watts 20   Minutes 15   NuStep   Level 2   Watts 30   Minutes 15   Arm Ergometer   Level 1   Watts 5   Minutes 10   Arm/Foot Ergometer   Level 1   Watts 8   Minutes 15   Cybex   Level 1   RPM 30   Minutes 15   Recumbant Elliptical   Level 1   Watts 15   Minutes 15   REL-XR   Level 2   Watts 20   Minutes 15   Prescription Details   Frequency (times per week) 3   Duration Progress to 30 minutes of continuous aerobic without signs/symptoms of physical distress   Intensity   THRR REST +  20   Ratings of Perceived Exertion 11-13   Perceived Dyspnea 2-4   Progression Continue progressive overload as per policy without signs/symptoms or physical distress.   Resistance Training   Training Prescription Yes   Weight 1   Reps 10-12      Exercise Goals: Frequency: Be able to perform aerobic exercise three times per week working toward 3-5 days per week.  Intensity: Work with a perceived exertion of 11 (fairly light) - 15 (hard) as tolerated. Follow your new exercise prescription and watch for changes in prescription as you progress with the program. Changes will be reviewed with you when they are made.  Duration: You should be able to do 30 minutes of continuous aerobic exercise in addition to a 5 minute warm-up and a 5 minute  cool-down routine.  Nutrition Goals: Your personal nutrition goals will be established when you do your nutrition analysis with the dietician.  The following are nutrition guidelines to follow: Cholesterol < 200mg /day Sodium < 1500mg /day Fiber: Men over 50 yrs - 30 grams per day  Personal Goals:     Personal Goals and Risk Factors at Admission - 12/13/14 1410    Personal Goals and Risk Factors on Admission    Weight Management No   Increase Aerobic Exercise and Physical Activity Yes   Intervention While in program, learn and follow the exercise prescription taught. Start at a low level workload and increase workload after able to maintain previous level for 30 minutes. Increase time before increasing intensity.   Develop more efficient breathing techniques such as purse lipped breathing and diaphragmatic breathing; and practicing self-pacing with activity Yes   Intervention While in program, learn and utilize the specific breathing techniques taught to you. Continue to practice and use the techniques as needed.   Diabetes No   Hypertension No   Lipids Yes   Goal Cholesterol controlled with medications as prescribed, with individualized exercise RX and with personalized nutrition plan. Value  goals: LDL < 70mg , HDL > 40mg . Participant states understanding of desired cholesterol values and following prescriptions.   Intervention Provide nutrition & aerobic exercise along with prescribed medications to achieve LDL 70mg , HDL >40mg .      Tobacco Use Initial Evaluation: History  Smoking status  . Former Smoker  . Quit date: 06/12/1973  Smokeless tobacco  . Not on file    Copy of goals given to participant.

## 2014-12-14 ENCOUNTER — Encounter: Payer: Medicare Other | Admitting: *Deleted

## 2014-12-14 DIAGNOSIS — I509 Heart failure, unspecified: Secondary | ICD-10-CM | POA: Diagnosis not present

## 2014-12-14 DIAGNOSIS — I213 ST elevation (STEMI) myocardial infarction of unspecified site: Secondary | ICD-10-CM

## 2014-12-14 DIAGNOSIS — Z95811 Presence of heart assist device: Secondary | ICD-10-CM

## 2014-12-14 DIAGNOSIS — I252 Old myocardial infarction: Secondary | ICD-10-CM | POA: Diagnosis not present

## 2014-12-14 DIAGNOSIS — Z9861 Coronary angioplasty status: Secondary | ICD-10-CM

## 2014-12-14 NOTE — Progress Notes (Signed)
Daily Session Note  Patient Details  Name: Scott [name] MRN: [id number] Date of Birth: May 03, 1946 Referring Provider:  [name], MD  Encounter Date: 12/14/2014  Check In:     Session Check In - 12/14/14 1132    Check-In   Staff Present Nyoka Cowden RN;Vaughan Garfinkle Dillard Essex MS, ACSM CEP Exercise Physiologist   ER physicians immediately available to respond to emergencies See telemetry face sheet for immediately available ER MD   Medication changes reported     No   Fall or balance concerns reported    No   Warm-up and Cool-down Performed on first and last piece of equipment   VAD Patient? Yes   VAD patient   Has back up controller? Yes   Has spare charged batteries? Yes   Has battery cables? Yes   Has compatible battery clips? Yes   Pain Assessment   Currently in Pain? No/denies   Multiple Pain Sites No         Goals Met:  Exercise tolerated well Personal goals reviewed No report of cardiac concerns or symptoms Strength training completed today  Goals Unmet:  Not Applicable  Goals Comments:    Dr. Emily Filbert is Medical Director for [facility].

## 2014-12-15 ENCOUNTER — Encounter: Payer: Self-pay | Admitting: *Deleted

## 2014-12-16 DIAGNOSIS — I509 Heart failure, unspecified: Secondary | ICD-10-CM | POA: Diagnosis not present

## 2014-12-16 DIAGNOSIS — I252 Old myocardial infarction: Secondary | ICD-10-CM | POA: Diagnosis not present

## 2014-12-16 NOTE — Progress Notes (Signed)
Daily Session Note  Patient Details  Name: AVON MERGENTHALER MRN: [id number] Date of Birth: October 05, 1945 Referring Provider:  [name], MD  Encounter Date: 12/16/2014  Check In:     Session Check In - 12/16/14 0912    Check-In   Staff Present Lestine Box BS, ACSM EP-C, Exercise Physiologist;Carroll Enterkin RN, BSN   ER physicians immediately available to respond to emergencies See telemetry face sheet for immediately available ER MD   Medication changes reported     No   Fall or balance concerns reported    No   Warm-up and Cool-down Performed on first and last piece of equipment   VAD Patient? No   VAD patient   Has back up controller? Yes   Has spare charged batteries? Yes   Has battery cables? Yes   Has compatible battery clips? Yes   Pain Assessment   Currently in Pain? No/denies         Goals Met:  Proper associated with RPD/PD & O2 Sat Exercise tolerated well No report of cardiac concerns or symptoms Strength training completed today  Goals Unmet:  Not Applicable  Goals Comments:    Dr. Emily Filbert is Medical Director for [facility].

## 2014-12-20 ENCOUNTER — Encounter: Payer: Self-pay | Admitting: *Deleted

## 2014-12-21 ENCOUNTER — Encounter: Payer: Self-pay | Admitting: *Deleted

## 2014-12-21 ENCOUNTER — Encounter: Payer: Medicare Other | Admitting: *Deleted

## 2014-12-21 DIAGNOSIS — I252 Old myocardial infarction: Secondary | ICD-10-CM | POA: Diagnosis not present

## 2014-12-21 DIAGNOSIS — I213 ST elevation (STEMI) myocardial infarction of unspecified site: Secondary | ICD-10-CM

## 2014-12-21 DIAGNOSIS — Z9861 Coronary angioplasty status: Secondary | ICD-10-CM

## 2014-12-21 DIAGNOSIS — Z95811 Presence of heart assist device: Secondary | ICD-10-CM

## 2014-12-21 DIAGNOSIS — I509 Heart failure, unspecified: Secondary | ICD-10-CM | POA: Diagnosis not present

## 2014-12-21 NOTE — Progress Notes (Signed)
Daily Session Note  Patient Details  Name: Scott [name] MRN: [id number] Date of Birth: 1945-12-07 Referring Provider:  [name], MD  Encounter Date: 12/21/2014  Check In:     Session Check In - 12/21/14 1049    Check-In   Staff Present Diane Joya Gaskins RN, Drusilla Kanner MS, ACSM CEP Exercise Physiologist   ER physicians immediately available to respond to emergencies See telemetry face sheet for immediately available ER MD   Medication changes reported     No   Fall or balance concerns reported    No   Warm-up and Cool-down Performed on first and last piece of equipment   VAD Patient? Yes   VAD patient   Has back up controller? Yes   Has spare charged batteries? Yes   Has battery cables? Yes   Has compatible battery clips? Yes   Pain Assessment   Currently in Pain? No/denies   Multiple Pain Sites No           Exercise Prescription Changes - 12/20/14 1600    Exercise Review   Progression Yes   Response to Exercise   Blood Pressure (Admit) 92/0 mmHg  Doppler   Blood Pressure (Exit) 84/0 mmHg   Heart Rate (Admit) 117 bpm   Heart Rate (Exercise) 113 bpm   Heart Rate (Exit) 108 bpm   Rating of Perceived Exertion (Exercise) 13   Symptoms No   Duration Progress to 30 minutes of continuous aerobic without signs/symptoms of physical distress   Intensity --  REST + 20   Progression Continue progressive overload as per policy without signs/symptoms or physical distress.   Resistance Training   Training Prescription Yes   Weight 2lb   Reps 10-15   Recumbant Bike   Level 3   RPM 35   Minutes 10   NuStep   Level 2   Watts 35   Minutes 20      Goals Met:  Independence with exercise equipment Exercise tolerated well Personal goals reviewed Strength training completed today  Goals Unmet:  Not Applicable  Goals Comments:   Dr. Emily Filbert is Medical Director for [place] [facility].

## 2014-12-21 NOTE — Progress Notes (Signed)
Cardiac Individual Treatment Plan  Patient Details  Name: Scott [name] MRN: [id number] Date of Birth: March 07, 1946 Referring Provider: Dr. Candee Furbish Initial Encounter Date:  12/13/2014  Visit Diagnosis: ST elevation myocardial infarction (STEMI), unspecified artery  LVAD (left ventricular assist device) present  Patient's Home Medications on Admission:  Current outpatient prescriptions:  .  acetaminophen (TYLENOL) 500 MG tablet, Take 500 mg by mouth every 6 (six) hours as needed., Disp: , Rfl:  .  cephALEXin (KEFLEX) 500 MG capsule, Take 1 capsule (500 mg total) by mouth 3 (three) times daily. (Patient not taking: Reported on 12/13/2014), Disp: 10 capsule, Rfl: 0 .  enoxaparin (LOVENOX) 40 MG/0.4ML injection, Inject 0.4 mLs (40 mg total) into the skin daily. (Patient not taking: Reported on 12/02/2014), Disp: 10 Syringe, Rfl: 2 .  furosemide (LASIX) 20 MG tablet, Take 1 tablet (20 mg total) by mouth every other day. (Patient taking differently: Take 20 mg by mouth every other day. Take if wt equal to or greater than 176 lbs), Disp: 30 tablet, Rfl: 6 .  loperamide (IMODIUM A-D) 2 MG tablet, Take 4 mg by mouth as needed for diarrhea or loose stools., Disp: , Rfl:  .  metaxalone (SKELAXIN) 800 MG tablet, Take 1 tablet (800 mg total) by mouth daily as needed for muscle spasms., Disp: 60 tablet, Rfl: 6 .  midodrine (PROAMATINE) 2.5 MG tablet, Take 1 tablet (2.5 mg total) by mouth daily., Disp: 60 tablet, Rfl: 3 .  Multiple Vitamin (MULTIVITAMIN) tablet, Take 1 tablet by mouth daily., Disp: , Rfl:  .  pantoprazole (PROTONIX) 40 MG tablet, Take 1 tablet (40 mg total) by mouth at bedtime., Disp: 30 tablet, Rfl: 6 .  potassium chloride SA (K-DUR,KLOR-CON) 20 MEQ tablet, Take 20 mEq by mouth every other day., Disp: , Rfl:  .  rosuvastatin (CRESTOR) 10 MG tablet, Take 10 mg by mouth daily., Disp: , Rfl:  .  traZODone (DESYREL) 100 MG tablet, Take 1 tablet (100 mg total) by mouth at bedtime., Disp:  30 tablet, Rfl: 6 .  warfarin (COUMADIN) 4 MG tablet, Take 1 tablet (4 mg total) by mouth daily at 6 PM. Take 1 pill every day except 1/2 pill Wednesday and Fridays. Or otherwise prescribed., Disp: 30 tablet, Rfl: 6  Past Medical History: Past Medical History  Diagnosis Date  . Hyperlipidemia   . Hypertension   . Diabetes   . Nephrolithiasis   . Ankylosing spondylitis   . CAD (coronary artery disease)     a. stenting of LCx 2013; b. STEMI 06/12/14 s/p PCI to LAD complicated by post cath shock requiring IABP; VT s/p DCCV, EF 20%; c. NSTEMI 06/26/14 treated medically.  . Ischemic cardiomyopathy     a. echo 08/23/2014 EF <20%, dilated CM, mod MR/TR  . Acute on chronic respiratory failure     a. 08/2014 in setting of PE.  . Bilateral pulmonary embolism     a. 08/2014 - started on Coumadin. Retrievable IVC filter placed 08/27/14 due to RV strain and large clot burden.  . Right leg DVT     a. 08/2014.  Marland Kitchen Chronic systolic CHF (congestive heart failure)   . Hypotension   . Hemoptysis     a. 08/2014 possibly due to pulm infarct/PE.  Marland Kitchen Pleural effusion on right 08/2014 - small  . Leukocytosis   . Carotid artery disease     a. s/p stenting.  . Ventricular tachycardia     a. 06/2014 at time of MI, s/p DCCV.  Marland Kitchen  Reactive thrombocytosis 09/10/2014  . Leukocytosis 09/10/2014    Tobacco Use: History  Smoking status  . Former Smoker  . Quit date: 06/12/1973  Smokeless tobacco  . Not on file    Labs: Recent Review Flowsheet Data    Labs for ITP Cardiac and Pulmonary Rehab Latest Ref Rng 10/12/2014 10/12/2014 10/12/2014 10/13/2014 10/14/2014   PHART 7.350 - 7.450 - 7.335(L) - - -   PCO2ART 35.0 - 45.0 mmHg - 68.6(HH) - - -   HCO3 20.0 - 24.0 mEq/L - 36.6(H) - - -   TCO2 0 - 100 mmol/L - 39 31 34 -   O2SAT - 76.1 99.0 - 86.9 87.0       Exercise Target Goals:    Exercise Program Goal: Individual exercise prescription set with THRR, safety & activity barriers. Participant demonstrates ability  to understand and report RPE using BORG scale, to self-measure pulse accurately, and to acknowledge the importance of the exercise prescription.  Exercise Prescription Goal: Starting with aerobic activity 30 plus minutes a day, 3 days per week for initial exercise prescription. Provide home exercise prescription and guidelines that participant acknowledges understanding prior to discharge.  Activity Barriers & Risk Stratification:     Activity Barriers & Risk Stratification - 12/13/14 1405    Activity Barriers & Risk Stratification   Activity Barriers Arthritis;Back Problems;Neck/Spine Problems   Risk Stratification High      6 Minute Walk:     6 Minute Walk      12/13/14 1516       6 Minute Walk   Phase Initial     Distance 735 feet     Walk Time 6 minutes     Resting HR 97 bpm     Max Ex. HR 112 bpm     RPE 11     Symptoms No        Initial Exercise Prescription:     Initial Exercise Prescription - 12/13/14 1500    Date of Initial Exercise Prescription   Date 12/13/14   Treadmill   MPH 1   Grade 0   Minutes 10   Bike   Level 0.4   Minutes 15   Recumbant Bike   Level 2   Watts 20   Minutes 15   NuStep   Level 2   Watts 30   Minutes 15   Arm Ergometer   Level 1   Watts 5   Minutes 10   Arm/Foot Ergometer   Level 1   Watts 8   Minutes 15   Cybex   Level 1   RPM 30   Minutes 15   Recumbant Elliptical   Level 1   Watts 15   Minutes 15   REL-XR   Level 2   Watts 20   Minutes 15   Prescription Details   Frequency (times per week) 3   Duration Progress to 30 minutes of continuous aerobic without signs/symptoms of physical distress   Intensity   THRR REST +  20   Ratings of Perceived Exertion 11-13   Perceived Dyspnea 2-4   Progression Continue progressive overload as per policy without signs/symptoms or physical distress.   Resistance Training   Training Prescription Yes   Weight 1   Reps 10-12      Exercise Prescription Changes:      Exercise Prescription Changes      12/16/14 0900 12/20/14 1600         Exercise Review   Progression  Yes Yes      Response to Exercise   Blood Pressure (Admit)  92/0 mmHg  Doppler      Blood Pressure (Exit)  84/0 mmHg      Heart Rate (Admit)  117 bpm      Heart Rate (Exercise)  113 bpm      Heart Rate (Exit)  108 bpm      Rating of Perceived Exertion (Exercise)  13      Symptoms  No      Duration  Progress to 30 minutes of continuous aerobic without signs/symptoms of physical distress      Intensity  --  REST + 20      Progression  Continue progressive overload as per policy without signs/symptoms or physical distress.      Resistance Training   Training Prescription  Yes      Weight  2lb      Reps  10-15      Recumbant Bike   Level 3 3      RPM 35 35      Minutes  10      NuStep   Level  2      Watts  35      Minutes  20         Discharge Exercise Prescription (Final Exercise Prescription Changes):     Exercise Prescription Changes - 12/20/14 1600    Exercise Review   Progression Yes   Response to Exercise   Blood Pressure (Admit) 92/0 mmHg  Doppler   Blood Pressure (Exit) 84/0 mmHg   Heart Rate (Admit) 117 bpm   Heart Rate (Exercise) 113 bpm   Heart Rate (Exit) 108 bpm   Rating of Perceived Exertion (Exercise) 13   Symptoms No   Duration Progress to 30 minutes of continuous aerobic without signs/symptoms of physical distress   Intensity --  REST + 20   Progression Continue progressive overload as per policy without signs/symptoms or physical distress.   Resistance Training   Training Prescription Yes   Weight 2lb   Reps 10-15   Recumbant Bike   Level 3   RPM 35   Minutes 10   NuStep   Level 2   Watts 35   Minutes 20      Nutrition:  Target Goals: Understanding of nutrition guidelines, daily intake of sodium 1500mg , cholesterol 200mg , calories 30% from fat and 7% or less from saturated fats, daily to have 5 or [name] servings of fruits and  vegetables.  Biometrics:     Pre Biometrics - 12/13/14 1523    Pre Biometrics   Height 5' 8.5" (1.74 m)   Weight 186 lb 11.2 oz (84.687 kg)   Waist Circumference 40 inches   Hip Circumference 42 inches   Waist to Hip Ratio 0.95 %   BMI (Calculated) 28       Nutrition Therapy Plan and Nutrition Goals:     Nutrition Therapy & Goals - 12/21/14 [place] Byran has deferred meeting with RD here as he has had education through the Heart Failure Clinic at [facility]      Nutrition Discharge: Rate Your Plate Scores:   Nutrition Goals Re-Evaluation:   Psychosocial: Target Goals: Acknowledge presence or absence of depression, maximize coping skills, provide positive support system. Participant is able to verbalize types and ability to use techniques and skills needed for reducing stress and depression.  Initial Review & Psychosocial Screening:  Initial Psych Review & Screening - 12/13/14 1418    Initial Review   Current issues with Current Sleep Concerns   Family Dynamics   Good Support System? Yes   Comments Does have counselor with LVAD program.      Barriers   Psychosocial barriers to participate in program There are no identifiable barriers or psychosocial needs.;The patient should benefit from training in stress management and relaxation.   Screening Interventions   Interventions Encouraged to exercise      Quality of Life Scores:     Quality of Life - 12/15/14 1122    Quality of Life Scores   Health/Function Pre 14.73 %   Socioeconomic Pre 23 %   Psych/Spiritual Pre 23.21 %   Family Pre 20.71 %   GLOBAL Pre 18.77 %      PHQ-9:     Recent Review Flowsheet Data    Depression screen [facility] 2/9 12/13/2014   Decreased Interest 2   Down, Depressed, Hopeless 0   PHQ - 2 Score 2   Altered sleeping 2   Tired, decreased energy 3   Change in appetite 0   Feeling bad or failure about yourself  0   Trouble concentrating 0   Moving  slowly or fidgety/restless 3   Suicidal thoughts 0   PHQ-9 Score 10   Difficult doing work/chores Very difficult      Psychosocial Evaluation and Intervention:   Psychosocial Re-Evaluation:   Vocational Rehabilitation: Provide vocational rehab assistance to qualifying candidates.   Vocational Rehab Evaluation & Intervention:     Vocational Rehab - 12/13/14 1407    Initial Vocational Rehab Evaluation & Intervention   Assessment shows need for Vocational Rehabilitation No      Education: Education Goals: Education classes will be provided on a weekly basis, covering required topics. Participant will state understanding/return demonstration of topics presented.  Learning Barriers/Preferences:     Learning Barriers/Preferences - 12/13/14 1406    Learning Barriers/Preferences   Learning Barriers Reading   Learning Preferences Video      Education Topics: General Nutrition Guidelines/Fats and Fiber: -Group instruction provided by verbal, written material, models and posters to present the general guidelines for heart healthy nutrition. Gives an explanation and review of dietary fats and fiber.   Controlling Sodium/Reading Food Labels: -Group verbal and written material supporting the discussion of sodium use in heart healthy nutrition. Review and explanation with models, verbal and written materials for utilization of the food label.   Exercise Physiology & Risk Factors: - Group verbal and written instruction with models to review the exercise physiology of the cardiovascular system and associated critical values. Details cardiovascular disease risk factors and the goals associated with each risk factor.   Aerobic Exercise & Resistance Training: - Gives group verbal and written discussion on the health impact of inactivity. On the components of aerobic and resistive training programs and the benefits of this training and how to safely progress through these  programs.   Flexibility, Balance, General Exercise Guidelines: - Provides group verbal and written instruction on the benefits of flexibility and balance training programs. Provides general exercise guidelines with specific guidelines to those with heart or lung disease. Demonstration and skill practice provided.   Stress Management: - Provides group verbal and written instruction about the health risks of elevated stress, cause of high stress, and healthy ways to reduce stress.   Depression: - Provides group verbal and written instruction on the correlation between heart/lung disease and depressed mood, treatment  options, and the stigmas associated with seeking treatment.   Anatomy & Physiology of the Heart: - Group verbal and written instruction and models provide basic cardiac anatomy and physiology, with the coronary electrical and arterial systems. Review of: AMI, Angina, Valve disease, Heart Failure, Cardiac Arrhythmia, Pacemakers, and the ICD.   Cardiac Procedures: - Group verbal and written instruction and models to describe the testing methods done to diagnose heart disease. Reviews the outcomes of the test results. Describes the treatment choices: Medical Management, Angioplasty, or Coronary Bypass Surgery.   Cardiac Medications: - Group verbal and written instruction to review commonly prescribed medications for heart disease. Reviews the medication, class of the drug, and side effects. Includes the steps to properly store meds and maintain the prescription regimen.   Go Sex-Intimacy & Heart Disease, Get SMART - Goal Setting: - Group verbal and written instruction through game format to discuss heart disease and the return to sexual intimacy. Provides group verbal and written material to discuss and apply goal setting through the application of the S.M.A.R.T. Method.   Other Matters of the Heart: - Provides group verbal, written materials and models to describe Heart  Failure, Angina, Valve Disease, and Diabetes in the realm of heart disease. Includes description of the disease process and treatment options available to the cardiac patient.   Exercise & Equipment Safety: - Individual verbal instruction and demonstration of equipment use and safety with use of the equipment.   Infection Prevention: - Provides verbal and written material to individual with discussion of infection control including proper hand washing and proper equipment cleaning during exercise session.   Falls Prevention: - Provides verbal and written material to individual with discussion of falls prevention and safety.   Diabetes: - Individual verbal and written instruction to review signs/symptoms of diabetes, desired ranges of glucose level fasting, after meals and with exercise. Advice that pre and post exercise glucose checks will be done for 3 sessions at entry of program.    Knowledge Questionnaire Score:     Knowledge Questionnaire Score - 12/21/14 1214    Knowledge Questionnaire Score   Pre Score deferred per participant      Personal Goals and Risk Factors at Admission:     Personal Goals and Risk Factors at Admission - 12/13/14 1410    Personal Goals and Risk Factors on Admission    Weight Management No   Increase Aerobic Exercise and Physical Activity Yes   Intervention While in program, learn and follow the exercise prescription taught. Start at a low level workload and increase workload after able to maintain previous level for 30 minutes. Increase time before increasing intensity.   Develop [name] efficient breathing techniques such as purse lipped breathing and diaphragmatic breathing; and practicing self-pacing with activity Yes   Intervention While in program, learn and utilize the specific breathing techniques taught to you. Continue to practice and use the techniques as needed.   Diabetes No   Hypertension No   Lipids Yes   Goal Cholesterol controlled  with medications as prescribed, with individualized exercise RX and with personalized nutrition plan. Value goals: LDL < 70mg , HDL > 40mg . Participant states understanding of desired cholesterol values and following prescriptions.   Intervention Provide nutrition & aerobic exercise along with prescribed medications to achieve LDL 70mg , HDL >40mg .      Personal Goals and Risk Factors Review:    Personal Goals Discharge:     Comments: 30 day review. Continue with ITP.   New participant to  program

## 2014-12-22 ENCOUNTER — Ambulatory Visit (HOSPITAL_COMMUNITY)
Admission: RE | Admit: 2014-12-22 | Discharge: 2014-12-22 | Disposition: A | Discharge status: Home / Self Care | Payer: Medicare Other | Source: Ambulatory Visit | Attending: Internal Medicine | Admitting: Internal Medicine

## 2014-12-22 ENCOUNTER — Telehealth (HOSPITAL_COMMUNITY): Payer: Self-pay | Admitting: Infectious Diseases

## 2014-12-22 ENCOUNTER — Other Ambulatory Visit (HOSPITAL_COMMUNITY): Payer: Self-pay | Admitting: Infectious Diseases

## 2014-12-22 ENCOUNTER — Encounter (HOSPITAL_COMMUNITY): Payer: Self-pay

## 2014-12-22 ENCOUNTER — Other Ambulatory Visit: Payer: Self-pay | Admitting: *Deleted

## 2014-12-22 ENCOUNTER — Encounter: Payer: Self-pay | Admitting: Licensed Clinical Social Worker

## 2014-12-22 ENCOUNTER — Ambulatory Visit (HOSPITAL_COMMUNITY): Payer: Self-pay | Admitting: Infectious Diseases

## 2014-12-22 VITALS — BP 100/73 | HR 103 | Ht 68.0 in | Wt 190.4 lb

## 2014-12-22 DIAGNOSIS — Z95811 Presence of heart assist device: Secondary | ICD-10-CM

## 2014-12-22 DIAGNOSIS — I5022 Chronic systolic (congestive) heart failure: Secondary | ICD-10-CM

## 2014-12-22 DIAGNOSIS — I2510 Atherosclerotic heart disease of native coronary artery without angina pectoris: Secondary | ICD-10-CM | POA: Insufficient documentation

## 2014-12-22 DIAGNOSIS — J449 Chronic obstructive pulmonary disease, unspecified: Secondary | ICD-10-CM | POA: Insufficient documentation

## 2014-12-22 DIAGNOSIS — R0609 Other forms of dyspnea: Secondary | ICD-10-CM

## 2014-12-22 DIAGNOSIS — I2699 Other pulmonary embolism without acute cor pulmonale: Secondary | ICD-10-CM | POA: Diagnosis not present

## 2014-12-22 DIAGNOSIS — Z7901 Long term (current) use of anticoagulants: Secondary | ICD-10-CM

## 2014-12-22 DIAGNOSIS — Z9861 Coronary angioplasty status: Secondary | ICD-10-CM

## 2014-12-22 DIAGNOSIS — I5041 Acute combined systolic (congestive) and diastolic (congestive) heart failure: Secondary | ICD-10-CM

## 2014-12-22 DIAGNOSIS — Z79899 Other long term (current) drug therapy: Secondary | ICD-10-CM

## 2014-12-22 LAB — CBC
HCT: 32.7 % — ABNORMAL LOW (ref 39.0–52.0)
Hemoglobin: 10.2 g/dL — ABNORMAL LOW (ref 13.0–17.0)
MCH: 28.4 pg (ref 26.0–34.0)
MCHC: 31.2 g/dL (ref 30.0–36.0)
MCV: 91.1 fL (ref 78.0–100.0)
Platelets: 358 10*3/uL (ref 150–400)
RBC: 3.59 MIL/uL — ABNORMAL LOW (ref 4.22–5.81)
RDW: 17.2 % — ABNORMAL HIGH (ref 11.5–15.5)
WBC: 9.4 10*3/uL (ref 4.0–10.5)

## 2014-12-22 LAB — MAGNESIUM: Magnesium: 1.9 mg/dL (ref 1.7–2.4)

## 2014-12-22 LAB — PREALBUMIN: Prealbumin: 17 mg/dL — ABNORMAL LOW (ref 18–38)

## 2014-12-22 LAB — BRAIN NATRIURETIC PEPTIDE: B Natriuretic Peptide: 1126.5 pg/mL — ABNORMAL HIGH (ref 0.0–100.0)

## 2014-12-22 LAB — COMPREHENSIVE METABOLIC PANEL
ALBUMIN: 2.8 g/dL — AB (ref 3.5–5.0)
ALT: 23 U/L (ref 17–63)
AST: 26 U/L (ref 15–41)
Alkaline Phosphatase: 242 U/L — ABNORMAL HIGH (ref 38–126)
Anion gap: 7 (ref 5–15)
BUN: 13 mg/dL (ref 6–20)
CALCIUM: 9.2 mg/dL (ref 8.9–10.3)
CO2: 25 mmol/L (ref 22–32)
CREATININE: 0.92 mg/dL (ref 0.61–1.24)
Chloride: 108 mmol/L (ref 101–111)
GFR calc Af Amer: >60 mL/min (ref >60)
GFR calc non Af Amer: >60 mL/min (ref >60)
Glucose, Bld: 130 mg/dL — ABNORMAL HIGH (ref 65–99)
Potassium: 3.9 mmol/L (ref 3.5–5.1)
Sodium: 140 mmol/L (ref 135–145)
Total Bilirubin: 0.6 mg/dL (ref 0.3–1.2)
Total Protein: 7.5 g/dL (ref 6.5–8.1)

## 2014-12-22 LAB — PROTIME-INR
INR: 1.31 (ref 0.00–1.49)
Prothrombin Time: 16.4 seconds — ABNORMAL HIGH (ref 11.6–15.2)

## 2014-12-22 LAB — LACTATE DEHYDROGENASE: LDH: 229 U/L — AB (ref 98–192)

## 2014-12-22 MED ORDER — POTASSIUM CHLORIDE CRYS ER 20 MEQ PO TBCR: 20.0000 meq | EXTENDED_RELEASE_TABLET | ORAL | Status: DC

## 2014-12-22 MED ORDER — FUROSEMIDE 20 MG PO TABS: 20.0000 mg | ORAL_TABLET | Freq: Every day | ORAL | Status: DC

## 2014-12-22 MED ORDER — FUROSEMIDE 20 MG PO TABS: ORAL_TABLET | ORAL | Status: DC

## 2014-12-22 NOTE — Progress Notes (Signed)
Patient ID: Scott [name], male   DOB: [birth year], 69 y.o.   MRN: [id number]  Primary Cardiologist: Dr Haroldine Laws INR : [facility]  HPI: Scott [name] is a 69 y/o with h/o CAD with ankylosing spondylitis, DM2, carotid stenting, severe ischemic CM s/p anterior STEM with VT arrest in 12/15, large PE with pulmonary infarct in 2/15 s/p IVC filter and systolic HF with EF 48%.  He underwent HM II LVAD placement on 09/20/13. He had a prolonged hospital course due to recurrent chest bleeding, severe CO2 retention and RV failure.  Follow-up Presents today for follow-up. He continues to improve. Feeling better and better. Appetite is good. Denies dyspnea. No edema. Still with arthritic pain. Using Bipap 3-4 hours per night. Weight up a few pounds. Dizziness resolved. Wants to stop midodrine.   No problems with VAD dressing. No bleeding, fevers, chills, melena, dark urine. Performing self test every day. Wife doing dressing changes.    LVAD interrogation reveals:  Speed: 9200 Flow: 4.7 Power: 5.3 PI: 5.6 Alarms: No alarms since last visit Events: two PI events since last visit.  Fixed speed: 9200  Low speed limit: 8600   SH:  History   Social History  . Marital Status: Married    Spouse Name: N/A  . Number of Children: N/A  . Years of Education: N/A   Occupational History  . Not on file.   Social History Main Topics  . Smoking status: Former Smoker    Quit date: 06/12/1973  . Smokeless tobacco: Not on file  . Alcohol Use: No  . Drug Use: No  . Sexual Activity: Not Currently   Other Topics Concern  . Not on file   Social History Narrative    FH:  Family History  Problem Relation Age of Onset  . Hypertension Mother     Past Medical History  Diagnosis Date  . Hyperlipidemia   . Hypertension   . Diabetes   . Nephrolithiasis   . Ankylosing spondylitis   . CAD (coronary artery disease)     a. stenting of LCx 2013; b. STEMI 06/12/14 s/p PCI to LAD complicated by post  cath shock requiring IABP; VT s/p DCCV, EF 20%; c. NSTEMI 06/26/14 treated medically.  . Ischemic cardiomyopathy     a. echo 08/23/2014 EF <20%, dilated CM, mod MR/TR  . Acute on chronic respiratory failure     a. 08/2014 in setting of PE.  . Bilateral pulmonary embolism     a. 08/2014 - started on Coumadin. Retrievable IVC filter placed 08/27/14 due to RV strain and large clot burden.  . Right leg DVT     a. 08/2014.  Marland Kitchen Chronic systolic CHF (congestive heart failure)   . Hypotension   . Hemoptysis     a. 08/2014 possibly due to pulm infarct/PE.  Marland Kitchen Pleural effusion on right 08/2014 - small  . Leukocytosis   . Carotid artery disease     a. s/p stenting.  . Ventricular tachycardia     a. 06/2014 at time of MI, s/p DCCV.  Marland Kitchen Reactive thrombocytosis 09/10/2014  . Leukocytosis 09/10/2014    Current Outpatient Prescriptions  Medication Sig Dispense Refill  . acetaminophen (TYLENOL) 500 MG tablet Take 500 mg by mouth every 6 (six) hours as needed.    . enoxaparin (LOVENOX) 40 MG/0.4ML injection Inject 0.4 mLs (40 mg total) into the skin daily. 10 Syringe 2  . loperamide (IMODIUM A-D) 2 MG tablet Take 4 mg by mouth as needed for diarrhea or  loose stools.    . metaxalone (SKELAXIN) 800 MG tablet Take 1 tablet (800 mg total) by mouth daily as needed for muscle spasms. 60 tablet 6  . Multiple Vitamin (MULTIVITAMIN) tablet Take 1 tablet by mouth daily.    . pantoprazole (PROTONIX) 40 MG tablet Take 1 tablet (40 mg total) by mouth at bedtime. 30 tablet 6  . rosuvastatin (CRESTOR) 10 MG tablet Take 10 mg by mouth daily.    . traZODone (DESYREL) 100 MG tablet Take 1 tablet (100 mg total) by mouth at bedtime. 30 tablet 6  . warfarin (COUMADIN) 4 MG tablet Take 1 tablet (4 mg total) by mouth daily at 6 PM. Take 1 pill every day except 1/2 pill Wednesday and Fridays. Or otherwise prescribed. 30 tablet 6  . furosemide (LASIX) 20 MG tablet Every other day alternate 20 mg (1 pill) with 40 mg (2 pills) 60 tablet 6   . potassium chloride SA (K-DUR,KLOR-CON) 20 MEQ tablet Take 1 tablet (20 mEq total) by mouth as directed. Take 1 pill with 20 mg Lasix daily and 2 pills with 40 mg Lasix daily 60 tablet 6   No current facility-administered medications for this encounter.     Vital signs: HR: 103 MAP BP: 100 Automatic BP: 100/73 (87) O2 Sat: 95% Wt: 190.4 lbs  Last wt: 185.4 lbs Home wts: 180 - 184 lbs  Ht: 5'8"   PHYSICAL EXAM: General:  Well appearing.  No resp difficulty HEENT: normal Neck: limited ROM JVP 10 Carotids 2+ bilaterally; no bruits. No lymphadenopathy or thryomegaly appreciated. Cor: RR + LVAD hum Lungs: clear  Abdomen: soft, nontender, nondistended. Driveline site clear Extremities: no cyanosis, clubbing, rash. tr edema Neuro: alert & orientedx3, cranial nerves grossly intact. Moves all 4 extremities w/o difficulty. Affect pleasant.   ASSESSMENT & PLAN: 1. Chronic systolic HF. EF 20% s/p HMII LVAD 09/20/13 --Doing very well post VAD. Volume status and MAPs much improved.  .  --Can stop midodrine.  --Volume status up. Increase lasix alternating 40 daily with 20 daily.  2. CAD s/p Anterior STEMI on 06/12/14 with stenting of LAD- -stable. continue statin 3. VAD --Interrogated personally. Parameters stable.  --Driveline site looks good 4. Ankylosing spondylitis. --off NSAIDS. Skelaxin as needed.  5. Bilateral P with pulmonary infarctE- S/P IVC filter 08/27/2014- continue coumadin 6. COPD/CO2 retention  -improved. Continue BiPAP 7. Anticoagulation  --INR low as he has been eating greens. Long talk about need to get consistent diet and to maintain INR in goal range to avoid risk of pump trhombosis --continue coumadin. Goal INR 1.8-2.2 due to bleeding. Off ASA.    Aeliana Spates, Draedyn,MD 4:20 PM

## 2014-12-22 NOTE — Patient Instructions (Addendum)
1. Progressed your dressing changes to weekly kits. If you notice any moisture (from sweating or showering) under the dressing change it.   2. Officially OK to drive. Stop your Midodrine.   3. INR is 1.31 today--Start 40 mg Lovenox injections once a day. Recheck your INR on Friday I will call the [facility] today   4. Here are some tips for Showering:  . Avoid getting the driveline exit site dressing wet, and consider planning bathing times around exit site dressing changes. . Use only the shower bag we gave you to shower with and don't get creative with your equipment to shower. Water and electricity do not mix and will cause your pump to stop.   . Sit on a chair or bathing stool in the bathtub or shower stall, and use a basin of warm water and washcloth or sponge to wash. Or, wash at the sink while standing on a towel, so as not to get the floor or bath rug wet. . To wash your hair, try using a hand-held shower wand or sprayer while standing over the kitchen sink or bathtub. . Be sure that all floor surfaces are dry when walking around after bathing, to avoid slipping. . Do not use powder around the exit site dressing. . Anytime your dressing appears wet CHANGE IT!

## 2014-12-22 NOTE — Progress Notes (Signed)
CSW met briefly with patient's wife. She spoke about VAD celebration and how much she enjoyed seeing others and connecting with those living with VAD's. She mentioned that it was helpful to hear the stories of what others have gone through and related to her own story. CSW provided support and encouraged wife to attend support group next month. Raquel Sarna, [place]

## 2014-12-22 NOTE — Telephone Encounter (Signed)
Called and informed Scott [name] that we would be increasing his dose of lasix d/t increased SOB and BNP. He is currently taking 20 mg daily and we will have him now alternate with 20 mg and 40 mg daily. Will also take extra dose of potassium on the days he takes 40 mg. Will arrange with his PCP's office Lisette Grinder) that he will need to come in for repeat BNP/BMP in 1 week on 6/30 to assess electrolytes, kidney function and fluid status. Verbalized understanding. Orders faxed to his office and called to schedule appointment.

## 2014-12-22 NOTE — Progress Notes (Addendum)
Symptom  Yes  No  Details   Angina        x Activity:   Claudication        x How far:   Syncope        x When: Dizziness with orthostatic changes has improved since starting midodrine.   Stroke        x   Orthopnea        x How many pillows:  1  PND        x How often:  BiPAP         x    How many hrs:  All night (5 - 6  hours)  Pedal edema               x   Abd fullness        x   N&V        x Improving appetite  Diaphoresis        x When:  Bleeding       x   Urine color   Light - medium yellow  SOB         x  Activity: improving   Palpitations         x When:  ICD shock       N/A   Hospitlizaitons                x When/where/why:    ED visit         x When/where/why:  Other MD         x When/who/why:  Activity    Increasing every week. Started cardiac rehab 3 weeks ago two times a week.   Fluid    2000  Diet    Low sodium   Vital signs: HR:  103 MAP BP:  100 Automatic BP:  100/73 (87) O2 Sat: 95% Wt:  190.4 lbs  Last wt: 185.4 lbs Home wts: 180 - 184 lbs  Ht:  5'8"   LVAD interrogation reveals:  Speed:  9200 Flow:  4.7 Power: 5.3 PI: 5.6 Alarms: No alarms since last visit Events: two PI events since last visit.  Fixed speed: 9200  Low speed limit: 8600  Primary Controller: Replace back up battery in 25 months. Back up controller:  Replace back up battery in 25 months.   LVAD interrogation negative for any power spikes, speed drops/PI events. No alarms recorded on event log and none reported by patient/caregiver. Back up equipment is present. No adjustments were made to prescribed settings.   Log book reviewed from home and WNL for patient's baseline. He has put on some solid weight with rehab and return of normal diet and is happy with his current weight.   LVAD exit site:  VAD dressing removed and site care performed using sterile technique. Drive line exit site cleaned with Chlora prep applicators x 2, allowed to dry, Sorbaview applied and BioPatch placed.  Exit site with complete tissue ingrowth, completed the course of Keflex prescribed. Decreased WBC from previous draw, no erythema noted, no tenderness, drainage, or foul odor noted. Will advance dressing changes to weekly.  Performed dressing change with [name] and provided them with 1 month supply dressings. Dr. [name] in to assess dressing  Encounter Details:  In today for 2 week follow up. Feels stronger and has a good appetite. Requesting Panorex x-ray disk for his dentist appointment in August (provided). Discussed that he will need prophylactic amoxicillin dosing at  that time. Celebrated 3 months of being on LVAD support today--he completed the [employer] QOL surveys today with the help of his wife. He stated that he would not go through VAD surgery again at this point and "does not feel that this is living." He stopped his Celexa (self-elected) a few months ago but does not claim to have any problems with anxiety or depression; he claims he did this surgery for his wife only. Trailmaking completed correctly in 57m 43s & 6MW 780 feet--needed to stop at 70m 30 seconds for 15 s break d/t SOB.   As above site looks great and other wounds are healing well. He has been taking Midodrine infrequently since he cannot tell a difference on or off of it so Dr. Haroldine Laws stopped it today. Advised that if the dizzy episodes came back up to take not of what his VAD numbers are at those moments and assess for decreased numbers (as he was orthostatic in the past). Claims that he has not had any further dizziness, however one time 3 weeks ago felt 'off'. Stated that the numbers on his pump were all right and at baseline and he just did not feel great for about 1 - 2 hours but it went away and has not returned. Denied any CP/SOB/Palpitations during event and cannot associate it with anything in particular. Advised to monitor.   Have been taking lasix/KCl daily based on weights > 176 which has been daily for about 2 weeks  now--D/W Dr. Haroldine Laws will alternate lasix 20 mg / 40 mg every other day with KCl 20 mEq / 40 mEq. (BNP elevated 1126 today and exhibited SOB with 6MW.) Recheck BMP and BNP in 1 week.   INR 1.3 Discussed importance of consistent diet  Able to shower with weekly dressing advancement and able to drive as D/W Dr. Dennie Fetters he declined to learn how to use the shower bag at this time and feels unready to shower.   RTC in 3 weeks  Janene Madeira, RN VAD Coordinator   Office: [phone] 24/7 [place] Pager: [phone]

## 2014-12-23 ENCOUNTER — Telehealth (HOSPITAL_COMMUNITY): Payer: Self-pay | Admitting: Infectious Diseases

## 2014-12-23 DIAGNOSIS — I252 Old myocardial infarction: Secondary | ICD-10-CM | POA: Diagnosis not present

## 2014-12-23 DIAGNOSIS — I214 Non-ST elevation (NSTEMI) myocardial infarction: Secondary | ICD-10-CM

## 2014-12-23 DIAGNOSIS — I509 Heart failure, unspecified: Secondary | ICD-10-CM | POA: Diagnosis not present

## 2014-12-23 NOTE — Telephone Encounter (Signed)
Called and LM regarding conversation with Dr. Thomes Dinning office yesterday. He would prefer for [name] to have blood work drawn at [facility]. Requested call back so we can schedule and arrange STAT lab work tomorrow and repeat BMP/BNP next week.

## 2014-12-23 NOTE — Progress Notes (Signed)
Daily Session Note  Patient Details  Name: Scott [name] MRN: [id number] Date of Birth: 08/05/1945 Referring Provider:  [name], MD  Encounter Date: 12/23/2014  Check In:     Session Check In - 12/23/14 0958    Check-In   Staff Present Lestine Box BS, ACSM EP-C, Exercise Physiologist;Carroll Enterkin RN, BSN   ER physicians immediately available to respond to emergencies See telemetry face sheet for immediately available ER MD   Medication changes reported     No   Fall or balance concerns reported    No   Warm-up and Cool-down Performed on first and last piece of equipment   VAD Patient? No   VAD patient   Has back up controller? Yes   Has spare charged batteries? Yes   Has battery cables? Yes   Has compatible battery clips? Yes   Pain Assessment   Currently in Pain? No/denies         Goals Met:  Proper associated with RPD/PD & O2 Sat Exercise tolerated well No report of cardiac concerns or symptoms Strength training completed today  Goals Unmet:  Not Applicable  Goals Comments:    Dr. Emily Filbert is Medical Director for [place] and [facility].

## 2014-12-24 ENCOUNTER — Other Ambulatory Visit (HOSPITAL_COMMUNITY): Payer: Self-pay | Admitting: *Deleted

## 2014-12-24 ENCOUNTER — Ambulatory Visit (HOSPITAL_COMMUNITY): Payer: Self-pay | Admitting: *Deleted

## 2014-12-24 ENCOUNTER — Other Ambulatory Visit: Payer: Self-pay | Admitting: *Deleted

## 2014-12-24 ENCOUNTER — Other Ambulatory Visit: Payer: Medicare Other

## 2014-12-24 ENCOUNTER — Ambulatory Visit (HOSPITAL_COMMUNITY)
Admission: RE | Admit: 2014-12-24 | Discharge: 2014-12-24 | Disposition: A | Discharge status: Home / Self Care | Payer: Medicare Other | Source: Ambulatory Visit | Attending: Cardiology | Admitting: Cardiology

## 2014-12-24 ENCOUNTER — Telehealth (HOSPITAL_COMMUNITY): Payer: Self-pay | Admitting: *Deleted

## 2014-12-24 DIAGNOSIS — Z7901 Long term (current) use of anticoagulants: Secondary | ICD-10-CM | POA: Diagnosis not present

## 2014-12-24 DIAGNOSIS — Z95811 Presence of heart assist device: Secondary | ICD-10-CM | POA: Diagnosis not present

## 2014-12-24 DIAGNOSIS — I5022 Chronic systolic (congestive) heart failure: Secondary | ICD-10-CM | POA: Diagnosis not present

## 2014-12-24 DIAGNOSIS — Z5181 Encounter for therapeutic drug level monitoring: Secondary | ICD-10-CM | POA: Diagnosis not present

## 2014-12-24 DIAGNOSIS — R0600 Dyspnea, unspecified: Secondary | ICD-10-CM

## 2014-12-24 DIAGNOSIS — Z79899 Other long term (current) drug therapy: Secondary | ICD-10-CM

## 2014-12-24 LAB — PROTIME-INR
INR: 1.46 (ref 0.00–1.49)
Prothrombin Time: 17.8 seconds — ABNORMAL HIGH (ref 11.6–15.2)

## 2014-12-24 NOTE — Telephone Encounter (Signed)
Wife called to report outside labs (INR) not drawn; problem with orders and paperwork, pt did not want to wait; currently driving to [facility] for INR draw here in VAD clinic. Orders placed, visit scheduled.

## 2014-12-27 ENCOUNTER — Ambulatory Visit (HOSPITAL_COMMUNITY)
Admission: RE | Admit: 2014-12-27 | Discharge: 2014-12-27 | Disposition: A | Discharge status: Home / Self Care | Payer: Medicare Other | Source: Ambulatory Visit | Attending: Internal Medicine | Admitting: Internal Medicine

## 2014-12-27 ENCOUNTER — Ambulatory Visit (HOSPITAL_COMMUNITY): Payer: Self-pay | Admitting: *Deleted

## 2014-12-27 DIAGNOSIS — I5022 Chronic systolic (congestive) heart failure: Secondary | ICD-10-CM | POA: Diagnosis not present

## 2014-12-27 DIAGNOSIS — R0600 Dyspnea, unspecified: Secondary | ICD-10-CM | POA: Insufficient documentation

## 2014-12-27 DIAGNOSIS — Z95811 Presence of heart assist device: Secondary | ICD-10-CM | POA: Insufficient documentation

## 2014-12-27 DIAGNOSIS — Z79899 Other long term (current) drug therapy: Secondary | ICD-10-CM | POA: Diagnosis not present

## 2014-12-27 DIAGNOSIS — Z48812 Encounter for surgical aftercare following surgery on the circulatory system: Secondary | ICD-10-CM | POA: Diagnosis not present

## 2014-12-27 DIAGNOSIS — Z7901 Long term (current) use of anticoagulants: Secondary | ICD-10-CM | POA: Insufficient documentation

## 2014-12-27 LAB — BASIC METABOLIC PANEL
Anion gap: 8 (ref 5–15)
BUN: 9 mg/dL (ref 6–20)
CO2: 25 mmol/L (ref 22–32)
Calcium: 9.1 mg/dL (ref 8.9–10.3)
Chloride: 105 mmol/L (ref 101–111)
Creatinine, Ser: 0.88 mg/dL (ref 0.61–1.24)
Glucose, Bld: 117 mg/dL — ABNORMAL HIGH (ref 65–99)
POTASSIUM: 4 mmol/L (ref 3.5–5.1)
Sodium: 138 mmol/L (ref 135–145)

## 2014-12-27 LAB — PROTIME-INR
INR: 1.76 — AB (ref 0.00–1.49)
PROTHROMBIN TIME: 20.5 s — AB (ref 11.6–15.2)

## 2014-12-27 LAB — BRAIN NATRIURETIC PEPTIDE: B NATRIURETIC PEPTIDE 5: 754.8 pg/mL — AB (ref 0.0–100.0)

## 2014-12-28 ENCOUNTER — Encounter: Payer: Medicare Other | Admitting: *Deleted

## 2014-12-28 DIAGNOSIS — I252 Old myocardial infarction: Secondary | ICD-10-CM | POA: Diagnosis not present

## 2014-12-28 DIAGNOSIS — I509 Heart failure, unspecified: Secondary | ICD-10-CM | POA: Diagnosis not present

## 2014-12-28 DIAGNOSIS — Z95811 Presence of heart assist device: Secondary | ICD-10-CM

## 2014-12-28 DIAGNOSIS — I213 ST elevation (STEMI) myocardial infarction of unspecified site: Secondary | ICD-10-CM

## 2014-12-28 NOTE — Progress Notes (Signed)
Daily Session Note  Patient Details  Name: Scott [name] MRN: [id number] Date of Birth: 01/21/1946 Referring Provider:  Walker, John B III, MD  Encounter Date: 12/28/2014  Check In:     Session Check In - 12/28/14 1105    Check-In   Staff Present Renee MacMillan MS, ACSM CEP Exercise Physiologist;Diane Wright RN, BSN   ER physicians immediately available to respond to emergencies See telemetry face sheet for immediately available ER MD   Medication changes reported     Yes   Fall or balance concerns reported    No   Warm-up and Cool-down Performed on first and last piece of equipment   VAD Patient? Yes   VAD patient   Has back up controller? Yes   Has spare charged batteries? Yes   Has battery cables? Yes   Has compatible battery clips? Yes   Pain Assessment   Currently in Pain? No/denies   Pain Score 0-No pain   Multiple Pain Sites No         Goals Met:  Exercise tolerated well No report of cardiac concerns or symptoms  Goals Unmet:  Not Applicable  Goals Comments:  Meds updated per patient's wife.  Pt had been on lovenox injections for 5 days while trying to get his INR in therapeutic range.  Patient no longer on lovenox.  Coumadin dosage and changes updated in medication section of e-chart.     Dr. Mark Miller is Medical Director for [facility] and [facility]. 

## 2014-12-30 ENCOUNTER — Ambulatory Visit (HOSPITAL_COMMUNITY): Payer: Self-pay | Admitting: *Deleted

## 2014-12-30 ENCOUNTER — Telehealth (HOSPITAL_COMMUNITY): Payer: Self-pay | Admitting: Infectious Diseases

## 2014-12-30 DIAGNOSIS — I252 Old myocardial infarction: Secondary | ICD-10-CM | POA: Diagnosis not present

## 2014-12-30 DIAGNOSIS — I509 Heart failure, unspecified: Secondary | ICD-10-CM

## 2014-12-30 LAB — PROTIME-INR: INR: 1.8 — AB (ref <=1.1)

## 2014-12-30 NOTE — Progress Notes (Signed)
Daily Session Note  Patient Details  Name: ANAKIN VARKEY MRN: [id number] Date of Birth: 28-May-1946 Referring Provider:  [name], MD  Encounter Date: 12/30/2014  Check In:     Session Check In - 12/30/14 2449    Check-In   Staff Present Lestine Box BS, ACSM EP-C, Exercise Physiologist;Carroll Enterkin RN, BSN   ER physicians immediately available to respond to emergencies See telemetry face sheet for immediately available ER MD   Medication changes reported     No   Fall or balance concerns reported    No   Warm-up and Cool-down Performed on first and last piece of equipment   VAD Patient? Yes   VAD patient   Has back up controller? Yes   Has spare charged batteries? Yes   Has battery cables? Yes   Has compatible battery clips? Yes   Pain Assessment   Currently in Pain? No/denies         Goals Met:  Proper associated with RPD/PD & O2 Sat Exercise tolerated well No report of cardiac concerns or symptoms Strength training completed today  Goals Unmet:  Not Applicable  Goals Comments:    Dr. Emily Filbert is Medical Director for [place] and [facility].

## 2014-12-31 NOTE — Telephone Encounter (Signed)
A user error has taken place: charting done on wrong patient and has been corrected.

## 2015-01-04 ENCOUNTER — Encounter: Payer: Medicare Other | Attending: Internal Medicine | Admitting: *Deleted

## 2015-01-04 ENCOUNTER — Ambulatory Visit (HOSPITAL_COMMUNITY): Payer: Self-pay | Admitting: *Deleted

## 2015-01-04 DIAGNOSIS — Z955 Presence of coronary angioplasty implant and graft: Secondary | ICD-10-CM | POA: Insufficient documentation

## 2015-01-04 DIAGNOSIS — Z95811 Presence of heart assist device: Secondary | ICD-10-CM | POA: Insufficient documentation

## 2015-01-04 DIAGNOSIS — I509 Heart failure, unspecified: Secondary | ICD-10-CM | POA: Diagnosis not present

## 2015-01-04 DIAGNOSIS — I252 Old myocardial infarction: Secondary | ICD-10-CM | POA: Insufficient documentation

## 2015-01-04 LAB — PROTIME-INR: INR: 1.7 — AB (ref <=1.1)

## 2015-01-04 NOTE — Progress Notes (Signed)
Daily Session Note  Patient Details  Name: DYSHON PHILBIN MRN: [id number] Date of Birth: March 31, 1946 Referring Provider:  [name], MD  Encounter Date: 01/04/2015  Check In:     Session Check In - 01/04/15 1024    Check-In   Staff Present Candiss Norse MS, ACSM CEP Exercise Physiologist;Diane Mariana Arn, BSN   ER physicians immediately available to respond to emergencies See telemetry face sheet for immediately available ER MD   Medication changes reported     No   Fall or balance concerns reported    No   Warm-up and Cool-down Performed on first and last piece of equipment   VAD Patient? Yes   VAD patient   Has back up controller? Yes   Has spare charged batteries? Yes   Has battery cables? Yes   Has compatible battery clips? Yes   Pain Assessment   Currently in Pain? No/denies   Pain Score 0-No pain   Multiple Pain Sites No         Goals Met:  Exercise tolerated well No report of cardiac concerns or symptoms Strength training completed today  Goals Unmet:  Not Applicable  Goals Comments:    Dr. Emily Filbert is Medical Director for [place] and [facility].

## 2015-01-06 DIAGNOSIS — Z95811 Presence of heart assist device: Secondary | ICD-10-CM | POA: Diagnosis not present

## 2015-01-06 DIAGNOSIS — I252 Old myocardial infarction: Secondary | ICD-10-CM | POA: Diagnosis not present

## 2015-01-06 DIAGNOSIS — I213 ST elevation (STEMI) myocardial infarction of unspecified site: Secondary | ICD-10-CM

## 2015-01-06 DIAGNOSIS — I509 Heart failure, unspecified: Secondary | ICD-10-CM | POA: Diagnosis not present

## 2015-01-06 DIAGNOSIS — Z955 Presence of coronary angioplasty implant and graft: Secondary | ICD-10-CM | POA: Diagnosis not present

## 2015-01-06 NOTE — Progress Notes (Signed)
Daily Session Note  Patient Details  Name: SAMI FROH MRN: [id number] Date of Birth: 06-10-1946 Referring Provider:  [name], MD  Encounter Date: 01/06/2015  Check In:     Session Check In - 01/06/15 0913    Check-In   Staff Present Lestine Box BS, ACSM EP-C, Exercise Physiologist;Carroll Enterkin RN, BSN   ER physicians immediately available to respond to emergencies See telemetry face sheet for immediately available ER MD   Medication changes reported     No   Fall or balance concerns reported    No   Warm-up and Cool-down Performed on first and last piece of equipment   VAD Patient? Yes   VAD patient   Has back up controller? Yes   Has spare charged batteries? Yes   Has battery cables? Yes   Has compatible battery clips? Yes   Pain Assessment   Currently in Pain? No/denies         Goals Met:  Proper associated with RPD/PD & O2 Sat Exercise tolerated well No report of cardiac concerns or symptoms Strength training completed today  Goals Unmet:  Not Applicable  Goals Comments:    Dr. Emily Filbert is Medical Director for [place] and [facility].

## 2015-01-10 ENCOUNTER — Encounter: Payer: Self-pay | Admitting: Internal Medicine

## 2015-01-10 ENCOUNTER — Ambulatory Visit (INDEPENDENT_AMBULATORY_CARE_PROVIDER_SITE_OTHER): Payer: Medicare Other | Admitting: Internal Medicine

## 2015-01-10 VITALS — HR 62 | Ht 69.0 in | Wt 187.0 lb

## 2015-01-10 DIAGNOSIS — I2699 Other pulmonary embolism without acute cor pulmonale: Secondary | ICD-10-CM

## 2015-01-10 DIAGNOSIS — I2510 Atherosclerotic heart disease of native coronary artery without angina pectoris: Secondary | ICD-10-CM

## 2015-01-10 DIAGNOSIS — I82411 Acute embolism and thrombosis of right femoral vein: Secondary | ICD-10-CM

## 2015-01-10 NOTE — Assessment & Plan Note (Signed)
Bilateral pulmonary embolus-provoked Status post IVC filter by interventional radiology [place]. Currently on Coumadin, has been on Coumadin for the past 5 months. Patient with significant heart failure and has a LVAD device follow-up cardiology.  Plan: -Repeat CTA chest with contrast PE protocol, if pulmonary embolus is reducing along with no findings of lower extremity clot burden will need to consider removal of filter. However, given his history of LVAD device and possible propensity to clot with this device, will leave filter removal decision to his cardiologist. -Continue anticoagulation as directed by cardiology

## 2015-01-10 NOTE — Assessment & Plan Note (Signed)
Patient with history of right femoral DVT seen on ultrasound at [facility]. Currently on Coumadin for heart failure and LVAD device.  Plan: -Repeat bilateral lower extremity ultrasound -See plan for pulmonary embolus

## 2015-01-10 NOTE — Addendum Note (Signed)
Addended by: Devona Konig on: 01/10/2015 04:53 PM   Modules accepted: Orders

## 2015-01-10 NOTE — Patient Instructions (Addendum)
Follow up with Dr. Stevenson Clinch in 3 month - CTA chest PE protocol - Bilateral lower extremity venous Doppler - Continue with anticoagulation as dictated by cardiology - We will call you with  results of CAT scan of chest and lower extremity Doppler ultrasounds once results become available

## 2015-01-10 NOTE — Progress Notes (Signed)
Date: 01/10/2015  MRN# [id number] Scott [name] [birth year]  Referring Physician: self referral.  Scott [name] is a 69 y.o. old male seen in consultation for history of bilateral PE, RLE DVT s\p IVC filter.   CC:  Chief Complaint  Patient presents with  . Advice Only    Self referral; Pt has IVC filter and was under the impression that it was to be removed.     HPI:  Patient is a pleasant 69 year old male with a past medical history of coronary artery disease, status post stenting, STEMI status post PCI to LAD, bilateral pulmonary embolus and right lower extremity DVT status post IVC filter placement, seen in consultation today evaluation of IVC filter removal Patient was admitted to [facility] from 08/26/2014 to 09/02/2014, see hospital consult note as stated below by Dr. Lake Bells, for post-cath shock requiring intra-aortic balloon pump, ventricular tachycardia and non-ST elevated MI. During this hospitalization he was noted to have bilateral pulmonary embolus and right lower extremity DVT, he had IVC filter placement done by interventional radiology, he was seen in consultation by pulmonary due to worsening hypoxia, he was placed on intermittent BiPAP eventually weaned off and continued with broncho-dilators. IVC filter was recommended to stay in place for 3-6 months and he was continued on anticoagulation. Patient states today that he is doing well, he has a LVAD in place and is currently on anticoagulation by cardiology. He denies any coughing of blood, worsening shortness of breath, night sweats, weight loss. Today's accompanied by his wife. He states that he had attempted smoking when he was a teenager for about 6 months. He previously worked for a [employer] as An[employer].    Review of Chart by Dr. Stevenson Clinch 69 y/o male with history of CAD with stenting of LCx 2013 with recent history of STEMI 06/12/2014 s/p PCI to LAD complicated by post-cath shock  required IABP then developed VT s/p DCCV, EF 20%, NSTEMI 06/26/2014 treated medically, DM2, acute respiratory failure, carotid stenting, and ankylosing spondylitis who presented to [facility] 08/22/2013 with 5 day history of worsening SOB, weight gain, and was found to have bilateral PE / RLE DVT. Transfer to [facility] 2/25. CCM called 2/25 for worsening hypoxia.    STUDIES:   LE dopplers from OSH: Right leg DVT  CT Chest from OSH: bilateral diffuse PEs w/o overt signs of right heart strain ECHO 12/12 >>EF 20%; mid L ventricle and apex is akinetic ECHO 12/21 >> Echo showed EF <20%, severely decreased global systolic LV function, dilated CM, moderate Scott/TR  SIGNIFICANT EVENTS: 12/12  STEMI s/p PCI to LAD complicated by post-cath shock required IABP then developed VT s/p DCCV 12/26  Admitted to [facility] for NSTEMI with peak troponin of 2.50 02/26  IVC filter placement   02/27  Not required bipap, CXR with mild elevation of R HD, very small effusion    ASSESSMENT / PLAN: Bilateral PE - on heparin gtt with coumadin overlap. D5/5 heparin/coumadin.  S/P IVC Filter placement 2/26.  RLE DVT Acute Hypoxic Respiratory Failure - in setting of acute PE, pleural effusions, ATX & chronic sCHF.   Hemoptysis - secondary to anticoagulation, resolved.   Small Bilateral Pleural Effusions - assessed per IR, small volume / not enough for thora RUL Airspace Disease   Systolic CHF   PLAN: -Duoneb PRN -no further BiPAP -Intermittent CXR -Monitor for further hemoptysis -Heparin / coumadin per pharmacy.  Patient will likely need minimum of 6 months duration.    -will  need determine timing for IVC removal per IR  -D4/x zithromax -negative to even balance -PPI  Scott Gens, NP-C [facility] Pgr: [phone] or [phone]  Attending I have seen and examined the patient with nurse practitioner/resident and agree with the note above.  Scott [name] looks great on exam today, lungs clear oxygenating  well This case is a reminder that most people who are critically ill from a PE that don't need thrombolytics right away!   Acute PE, provoked> needs 3-6 months of warfarin IVC filter> needs to be removed within 3-6 months Hemoptysis> seems to be resolving, likely some degree of pulmonary infarct Diarrhea > hopefully not c.diff, test pending I have made arrangements for him to f/u up with me  [name], MD [facility] Pager: [phone] Cell: [phone] If no response, call [phone]  PMHX:   Past Medical History  Diagnosis Date  . Hyperlipidemia   . Hypertension   . Diabetes   . Nephrolithiasis   . Ankylosing spondylitis   . CAD (coronary artery disease)     a. stenting of LCx 2013; b. STEMI 06/12/14 s/p PCI to LAD complicated by post cath shock requiring IABP; VT s/p DCCV, EF 20%; c. NSTEMI 06/26/14 treated medically.  . Ischemic cardiomyopathy     a. echo 08/23/2014 EF <20%, dilated CM, mod Scott/TR  . Acute on chronic respiratory failure     a. 08/2014 in setting of PE.  . Bilateral pulmonary embolism     a. 08/2014 - started on Coumadin. Retrievable IVC filter placed 08/27/14 due to RV strain and large clot burden.  . Right leg DVT     a. 08/2014.  Marland Kitchen Chronic systolic CHF (congestive heart failure)   . Hypotension   . Hemoptysis     a. 08/2014 possibly due to pulm infarct/PE.  Marland Kitchen Pleural effusion on right 08/2014 - small  . Leukocytosis   . Carotid artery disease     a. s/p stenting.  . Ventricular tachycardia     a. 06/2014 at time of MI, s/p DCCV.  Marland Kitchen Reactive thrombocytosis 09/10/2014  . Leukocytosis 09/10/2014   Surgical Hx:  Past Surgical History  Procedure Laterality Date  . Left heart catheterization with coronary angiogram N/A 06/12/2014    Procedure: LEFT HEART CATHETERIZATION WITH CORONARY ANGIOGRAM;  Surgeon: Lorretta Harp, MD;  Location: [facility] CATH LAB;  Service: Cardiovascular;  Laterality: N/A;  . Right heart catheterization N/A 09/10/2014    Procedure: RIGHT  HEART CATH;  Surgeon: [name], MD;  Location: [facility] CATH LAB;  Service: Cardiovascular;  Laterality: N/A;  . Right heart catheterization N/A 09/17/2014    Procedure: RIGHT HEART CATH;  Surgeon: [name], MD;  Location: [facility] CATH LAB;  Service: Cardiovascular;  Laterality: N/A;  . Insertion of implantable left ventricular assist device N/A 09/21/2014    Procedure: INSERTION OF IMPLANTABLE LEFT VENTRICULAR ASSIST DEVICE;  Surgeon: Ivin Poot, MD;  Location: [place];  Service: Open Heart Surgery;  Laterality: N/A;  CIRC ARREST  NITRIC OXIDE  . Tee without cardioversion N/A 09/21/2014    Procedure: TRANSESOPHAGEAL ECHOCARDIOGRAM (TEE);  Surgeon: Ivin Poot, MD;  Location: [place];  Service: Open Heart Surgery;  Laterality: N/A;  . Tee without cardioversion N/A 10/05/2014    Procedure: TRANSESOPHAGEAL ECHOCARDIOGRAM (TEE);  Surgeon: Ivin Poot, MD;  Location: [place];  Service: Open Heart Surgery;  Laterality: N/A;  . Bedside evacuation of hematoma  10/05/2014    Procedure: EVACUATION OF  HEMATOMA;  Surgeon: Ivin Poot, MD;  Location: [place];  Service: Open Heart Surgery;;  . Video assisted thoracoscopy (vats)/thorocotomy Left 10/06/2014    Procedure: VIDEO ASSISTED THORACOSCOPY (VATS)/THOROCOTOMY;  Surgeon: Ivin Poot, MD;  Location: [place];  Service: Open Heart Surgery;  Laterality: Left;   Family Hx:  Family History  Problem Relation Age of Onset  . Hypertension Mother    Social Hx:   History  Substance Use Topics  . Smoking status: Former Smoker    Quit date: 06/12/1973  . Smokeless tobacco: Not on file  . Alcohol Use: No   Medication:   Current Outpatient Rx  Name  Route  Sig  Dispense  Refill  . acetaminophen (TYLENOL) 500 MG tablet   Oral   Take 500 mg by mouth every 6 (six) hours as needed.         . enoxaparin (LOVENOX) 40 MG/0.4ML injection   Subcutaneous   Inject 0.4 mLs (40 mg total) into the skin daily.   10 Syringe   2   . furosemide (LASIX)  20 MG tablet      Every other day alternate 20 mg (1 pill) with 40 mg (2 pills)   60 tablet   6   . loperamide (IMODIUM A-D) 2 MG tablet   Oral   Take 4 mg by mouth as needed for diarrhea or loose stools.         . metaxalone (SKELAXIN) 800 MG tablet   Oral   Take 1 tablet (800 mg total) by mouth daily as needed for muscle spasms.   60 tablet   6   . pantoprazole (PROTONIX) 40 MG tablet   Oral   Take 1 tablet (40 mg total) by mouth at bedtime.   30 tablet   6   . potassium chloride SA (K-DUR,KLOR-CON) 20 MEQ tablet   Oral   Take 1 tablet (20 mEq total) by mouth as directed. Take 1 pill with 20 mg Lasix daily and 2 pills with 40 mg Lasix daily   60 tablet   6   . rosuvastatin (CRESTOR) 10 MG tablet   Oral   Take 10 mg by mouth daily.         . traZODone (DESYREL) 100 MG tablet   Oral   Take 1 tablet (100 mg total) by mouth at bedtime.   30 tablet   6     No refill needed at this time. Just a change in do ...   . warfarin (COUMADIN) 4 MG tablet   Oral   Take 1 tablet (4 mg total) by mouth daily at 6 PM. Take 1 pill every day except 1/2 pill Wednesday and Fridays. Or otherwise prescribed. Patient taking differently: Take 4 mg by mouth daily at 6 PM. Patient taking one 4 mg tablet every day except Monday, Wed, and Friday.   30 tablet   6     Dose change, no refill at this time please.   . warfarin (COUMADIN) 4 MG tablet   Oral   Take 6 mg by mouth daily. Tues, Thurs, Sat, Sun             Allergies:  Review of patient's allergies indicates no known allergies.  Review of Systems: Gen:  Denies  fever, sweats, chills HEENT: Denies blurred vision, double vision, ear pain, eye pain, hearing loss, nose bleeds, sore throat Cvc:  No dizziness, chest pain or heaviness Resp:   Mild shortness of breath and  mild nonproductive cough Gi: Denies swallowing difficulty, stomach pain, nausea or vomiting, diarrhea, constipation, bowel incontinence Gu:  Denies bladder  incontinence, burning urine Ext:   No Joint pain, stiffness or swelling Skin: No skin rash, easy bruising or bleeding or hives Endoc:  No polyuria, polydipsia , polyphagia or weight change Psych: No depression, insomnia or hallucinations  Other:  All other systems negative  Physical Examination:   VS: BP   Pulse 62  Ht 5\' 9"  (1.753 m)  Wt 187 lb (84.823 kg)  BMI 27.60 kg/m2  SpO2 95%  General Appearance: No distress  Neuro:without focal findings, mental status, speech normal, alert and oriented, cranial nerves 2-12 intact, reflexes normal and symmetric, sensation grossly normal  HEENT: PERRLA, EOM intact, no ptosis, no other lesions noticed; Mallampati 2 Pulmonary: normal breath sounds., diaphragmatic excursion normal.No wheezing, No rales;   Sputum Production:  None CardiovascularNormal S1,S2.  No m/r/g.  Abdominal aorta pulsation normal.    Abdomen: Benign, Soft, non-tender, No masses, hepatosplenomegaly, No lymphadenopathy Renal:  No costovertebral tenderness  GU:  No performed at this time. Endoc: No evident thyromegaly, no signs of acromegaly or Cushing features Skin:   warm, no rashes, no ecchymosis  Extremities: normal, no cyanosis, clubbing, no edema, warm with normal capillary refill. Other findings: Patient wearing LVAD vest   Rad results:  (The following images and results were reviewed by Dr. Stevenson Clinch). CT Chest 08/2014 FINDINGS: Small subsegmental pulmonary emboli within the right upper lobe pulmonary artery (series 406/image 39), the right lower lobe pulmonary artery (series 406/images 135 and 153), and the left lower lobe pulmonary artery (series 406/image 151).  Overall clot burden is small and is significantly improved from 08/22/2014. No evidence of right heart strain.  Mediastinum/Nodes: Cardiomegaly. Left ventricular hypertrophy. Again noted is intramural thrombus in the left ventricular apex (series 401/ image 79), although decreased when compared to  08/22/2014. No pericardial effusion.  Left main and 3 vessel coronary atherosclerosis.  Right IJ Swan-Ganz catheter terminates in the proximal right upper lobe pulmonary artery (series 406/image 146).  Intra-aortic balloon pump with radiopaque marker in the descending thoracic aortic arch (series 406/image 87).  Thoracic lymphadenopathy, including:  --11 mm short axis right supraclavicular node (series 401/ image 10)  --11 mm short axis high right paratracheal node (series 401/ image 21)  --12 mm short axis right paratracheal node (series 401/ image 36)  --15 mm short axis subcarinal node (series 401/image 46)  Visualized thyroid is unremarkable.  Lungs/Pleura: Patchy subpleural opacities in the posterior right upper lobe and right middle lobe, suspicious for pneumonia or less likely pulmonary infarcts. Additional patchy right lower lobe opacities, suspicious for pneumonia.  Moderate right pleural effusion, loculated.  Associated hyperdense fluid/hemorrhage with gas in the medial right pleural space (series 401/image 84), worrisome for hemorrhage and possible superinfection with gas-forming organism/empyema (in the absence of intervention). A developing bronchopleural fistula is also possible.  Left lung is notable for patchy opacity in the medial left upper lobe (series 407/ image 54) and small left pleural effusion.  No pneumothorax.  Upper abdomen: Visualized upper abdomen is notable for lobular splenic parenchyma in the left upper abdomen and artifact related to the patient's intra aortic balloon pump.  Musculoskeletal: Degenerative changes of the visualized thoracolumbar spine.  Review of the MIP images confirms the above findings.  IMPRESSION: Small subsegmental bilateral pulmonary emboli in this patient with known bilateral PEs. Overall clot burden is small and significantly improved from 08/22/2014. No evidence of right heart  strain.  Cardiomegaly with left ventricular hypertrophy. Intramural thrombus in the left ventricular apex, decreased when compared to 08/22/2014.  Patchy subpleural opacities in the right upper and middle lobes, suspicious for pneumonia or less likely pulmonary infarcts. Patchy right lower lobe opacities, suspicious for pneumonia.  Moderate right pleural effusion, loculated. Superimposed hemorrhage with gas at the medial right lung base, suggesting superinfection/empyema in the absence of recent intervention. Bronchopleural fistula is less likely but not excluded.  Right IJ Swan-Ganz catheter terminates in the proximal right upper lobe pulmonary artery. Enteric balloon pump with radiopaque marker in the proximal descending thoracic aortic arch.  Assessment and Plan: 69 year old male seen in consultation for evaluation of IVC filter removal with a history of bilateral pulmonary embolus and right femoral DVT Pulmonary embolus Bilateral pulmonary embolus-provoked Status post IVC filter by interventional radiology [place]. Currently on Coumadin, has been on Coumadin for the past 5 months. Patient with significant heart failure and has a LVAD device follow-up cardiology.  Plan: -Repeat CTA chest with contrast PE protocol, if pulmonary embolus is reducing along with no findings of lower extremity clot burden will need to consider removal of filter. However, given his history of LVAD device and possible propensity to clot with this device, will leave filter removal decision to his cardiologist. -Continue anticoagulation as directed by cardiology  Right femoral vein DVT Patient with history of right femoral DVT seen on ultrasound [facility]. Currently on Coumadin for heart failure and LVAD device.  Plan: -Repeat bilateral lower extremity ultrasound -See plan for pulmonary embolus    Updated Medication List Outpatient Encounter Prescriptions as of 01/10/2015  Medication Sig  .  acetaminophen (TYLENOL) 500 MG tablet Take 500 mg by mouth every 6 (six) hours as needed.  . enoxaparin (LOVENOX) 40 MG/0.4ML injection Inject 0.4 mLs (40 mg total) into the skin daily.  . furosemide (LASIX) 20 MG tablet Every other day alternate 20 mg (1 pill) with 40 mg (2 pills)  . loperamide (IMODIUM A-D) 2 MG tablet Take 4 mg by mouth as needed for diarrhea or loose stools.  . metaxalone (SKELAXIN) 800 MG tablet Take 1 tablet (800 mg total) by mouth daily as needed for muscle spasms.  . pantoprazole (PROTONIX) 40 MG tablet Take 1 tablet (40 mg total) by mouth at bedtime.  . potassium chloride SA (K-DUR,KLOR-CON) 20 MEQ tablet Take 1 tablet (20 mEq total) by mouth as directed. Take 1 pill with 20 mg Lasix daily and 2 pills with 40 mg Lasix daily  . rosuvastatin (CRESTOR) 10 MG tablet Take 10 mg by mouth daily.  . traZODone (DESYREL) 100 MG tablet Take 1 tablet (100 mg total) by mouth at bedtime.  Marland Kitchen warfarin (COUMADIN) 4 MG tablet Take 1 tablet (4 mg total) by mouth daily at 6 PM. Take 1 pill every day except 1/2 pill Wednesday and Fridays. Or otherwise prescribed. (Patient taking differently: Take 4 mg by mouth daily at 6 PM. Patient taking one 4 mg tablet every day except Monday, Wed, and Friday.)  . warfarin (COUMADIN) 4 MG tablet Take 6 mg by mouth daily. Tues, Thurs, Sat, Sun  . [DISCONTINUED] Multiple Vitamin (MULTIVITAMIN) tablet Take 1 tablet by mouth daily.   No facility-administered encounter medications on file as of 01/10/2015.    Orders for this visit: Orders Placed This Encounter  Procedures  . CT Angio Chest PE W/Cm &/Or Wo Cm    Standing Status: Future     Number of Occurrences:      Standing  Expiration Date: 04/11/2016    Order Specific Question:  Reason for Exam (SYMPTOM  OR DIAGNOSIS REQUIRED)    Answer:  pulmonary nodule    Order Specific Question:  Preferred imaging location?    Answer:  [facility]     Thank  you for the consultation and for allowing  [facility] to assist in the care of your patient. Our recommendations are noted above.  Please contact us if we can be of further service.   Vilinda Boehringer, MD [facility] Office Number: [phone]

## 2015-01-11 ENCOUNTER — Encounter: Payer: Medicare Other | Admitting: *Deleted

## 2015-01-11 ENCOUNTER — Encounter: Payer: Self-pay | Admitting: *Deleted

## 2015-01-11 ENCOUNTER — Other Ambulatory Visit: Payer: Self-pay | Admitting: Internal Medicine

## 2015-01-11 DIAGNOSIS — I252 Old myocardial infarction: Secondary | ICD-10-CM | POA: Diagnosis not present

## 2015-01-11 DIAGNOSIS — I82411 Acute embolism and thrombosis of right femoral vein: Secondary | ICD-10-CM

## 2015-01-11 DIAGNOSIS — I509 Heart failure, unspecified: Secondary | ICD-10-CM | POA: Diagnosis not present

## 2015-01-11 DIAGNOSIS — Z95811 Presence of heart assist device: Secondary | ICD-10-CM | POA: Diagnosis not present

## 2015-01-11 DIAGNOSIS — I213 ST elevation (STEMI) myocardial infarction of unspecified site: Secondary | ICD-10-CM

## 2015-01-11 DIAGNOSIS — I82401 Acute embolism and thrombosis of unspecified deep veins of right lower extremity: Secondary | ICD-10-CM

## 2015-01-11 DIAGNOSIS — Z955 Presence of coronary angioplasty implant and graft: Secondary | ICD-10-CM | POA: Diagnosis not present

## 2015-01-11 NOTE — Progress Notes (Signed)
Daily Session Note  Patient Details  Name: Scott [name] MRN: [id number] Date of Birth: [birth year] Referring Provider:  [name], MD  Encounter Date: 01/11/2015  Check In:     Session Check In - 01/11/15 0945    Check-In   Staff Present Candiss Norse MS, ACSM CEP Exercise Physiologist;Diane Mariana Arn, BSN   ER physicians immediately available to respond to emergencies See telemetry face sheet for immediately available ER MD   Medication changes reported     No   Fall or balance concerns reported    No   Warm-up and Cool-down Performed on first and last piece of equipment   VAD Patient? Yes   VAD patient   Has back up controller? Yes   Has spare charged batteries? Yes   Has battery cables? Yes   Has compatible battery clips? Yes   Pain Assessment   Currently in Pain? No/denies   Multiple Pain Sites No           Exercise Prescription Changes - 01/11/15 0600    Exercise Review   Progression Yes   Response to Exercise   Blood Pressure (Admit) 88/0 mmHg  Doppler   Blood Pressure (Exit) 88/0 mmHg   Heart Rate (Admit) 100 bpm   Heart Rate (Exercise) 117 bpm   Heart Rate (Exit) 107 bpm   Rating of Perceived Exertion (Exercise) 12   Symptoms No   Duration Progress to 30 minutes of continuous aerobic without signs/symptoms of physical distress   Intensity THRR unchanged  REST + 20   Progression Continue progressive overload as per policy without signs/symptoms or physical distress.   Resistance Training   Training Prescription Yes   Weight 3   Reps 10-15   Interval Training   Interval Training No   Recumbant Bike   Level 4   RPM 40   Minutes 15   NuStep   Level 2   Watts 35   Minutes 20   Recumbant Elliptical   Level 3   Watts 20   Minutes 20      Goals Met:  Exercise tolerated well No report of cardiac concerns or symptoms Strength training completed today  Goals Unmet:  Not Applicable  Goals Comments:    Dr. Emily Filbert is  Medical Director for [facility] and [facility].

## 2015-01-12 ENCOUNTER — Ambulatory Visit (HOSPITAL_COMMUNITY)
Admission: RE | Admit: 2015-01-12 | Discharge: 2015-01-12 | Disposition: A | Discharge status: Home / Self Care | Payer: Medicare Other | Source: Ambulatory Visit | Attending: Cardiology | Admitting: Cardiology

## 2015-01-12 ENCOUNTER — Encounter: Payer: Self-pay | Admitting: Licensed Clinical Social Worker

## 2015-01-12 ENCOUNTER — Other Ambulatory Visit (HOSPITAL_COMMUNITY): Payer: Self-pay | Admitting: *Deleted

## 2015-01-12 ENCOUNTER — Encounter: Payer: Self-pay | Admitting: Internal Medicine

## 2015-01-12 ENCOUNTER — Ambulatory Visit (HOSPITAL_COMMUNITY): Payer: Self-pay | Admitting: *Deleted

## 2015-01-12 VITALS — BP 98/0 | HR 101 | Ht 68.0 in | Wt 187.2 lb

## 2015-01-12 DIAGNOSIS — R0600 Dyspnea, unspecified: Secondary | ICD-10-CM | POA: Diagnosis not present

## 2015-01-12 DIAGNOSIS — Z87891 Personal history of nicotine dependence: Secondary | ICD-10-CM | POA: Diagnosis not present

## 2015-01-12 DIAGNOSIS — E785 Hyperlipidemia, unspecified: Secondary | ICD-10-CM | POA: Diagnosis not present

## 2015-01-12 DIAGNOSIS — Z86718 Personal history of other venous thrombosis and embolism: Secondary | ICD-10-CM | POA: Insufficient documentation

## 2015-01-12 DIAGNOSIS — I252 Old myocardial infarction: Secondary | ICD-10-CM | POA: Insufficient documentation

## 2015-01-12 DIAGNOSIS — I255 Ischemic cardiomyopathy: Secondary | ICD-10-CM | POA: Insufficient documentation

## 2015-01-12 DIAGNOSIS — I10 Essential (primary) hypertension: Secondary | ICD-10-CM | POA: Diagnosis not present

## 2015-01-12 DIAGNOSIS — I2510 Atherosclerotic heart disease of native coronary artery without angina pectoris: Secondary | ICD-10-CM | POA: Insufficient documentation

## 2015-01-12 DIAGNOSIS — I82411 Acute embolism and thrombosis of right femoral vein: Secondary | ICD-10-CM

## 2015-01-12 DIAGNOSIS — Z95811 Presence of heart assist device: Secondary | ICD-10-CM

## 2015-01-12 DIAGNOSIS — E119 Type 2 diabetes mellitus without complications: Secondary | ICD-10-CM | POA: Insufficient documentation

## 2015-01-12 DIAGNOSIS — Z79899 Other long term (current) drug therapy: Secondary | ICD-10-CM | POA: Diagnosis not present

## 2015-01-12 DIAGNOSIS — M459 Ankylosing spondylitis of unspecified sites in spine: Secondary | ICD-10-CM | POA: Diagnosis not present

## 2015-01-12 DIAGNOSIS — I5022 Chronic systolic (congestive) heart failure: Secondary | ICD-10-CM | POA: Diagnosis not present

## 2015-01-12 DIAGNOSIS — R0902 Hypoxemia: Secondary | ICD-10-CM | POA: Diagnosis not present

## 2015-01-12 DIAGNOSIS — J449 Chronic obstructive pulmonary disease, unspecified: Secondary | ICD-10-CM | POA: Insufficient documentation

## 2015-01-12 DIAGNOSIS — Z7901 Long term (current) use of anticoagulants: Secondary | ICD-10-CM

## 2015-01-12 DIAGNOSIS — Z86711 Personal history of pulmonary embolism: Secondary | ICD-10-CM | POA: Diagnosis not present

## 2015-01-12 LAB — BASIC METABOLIC PANEL
ANION GAP: 7 (ref 5–15)
BUN: 12 mg/dL (ref 6–20)
CO2: 27 mmol/L (ref 22–32)
Calcium: 9.8 mg/dL (ref 8.9–10.3)
Chloride: 106 mmol/L (ref 101–111)
Creatinine, Ser: 1.02 mg/dL (ref 0.61–1.24)
GFR calc Af Amer: >60 mL/min (ref >60)
GLUCOSE: 97 mg/dL (ref 65–99)
POTASSIUM: 4.7 mmol/L (ref 3.5–5.1)
Sodium: 140 mmol/L (ref 135–145)

## 2015-01-12 LAB — CBC
HEMATOCRIT: 38.2 % — AB (ref 39.0–52.0)
Hemoglobin: 11.9 g/dL — ABNORMAL LOW (ref 13.0–17.0)
MCH: 27.7 pg (ref 26.0–34.0)
MCHC: 31.2 g/dL (ref 30.0–36.0)
MCV: 88.8 fL (ref 78.0–100.0)
Platelets: 340 10*3/uL (ref 150–400)
RBC: 4.3 MIL/uL (ref 4.22–5.81)
RDW: 16.8 % — ABNORMAL HIGH (ref 11.5–15.5)
WBC: 10.3 10*3/uL (ref 4.0–10.5)

## 2015-01-12 LAB — BLOOD GAS, ARTERIAL
Acid-Base Excess: 1.4 mmol/L (ref 0.0–2.0)
BICARBONATE: 25.4 meq/L — AB (ref 20.0–24.0)
Drawn by: 129711
FIO2: 0.21 %
O2 Saturation: 96.7 %
PATIENT TEMPERATURE: 98.6
PCO2 ART: 40.4 mmHg (ref 35.0–45.0)
PH ART: 7.416 (ref 7.350–7.450)
PO2 ART: 87.4 mmHg (ref 80.0–100.0)
TCO2: 26.7 mmol/L (ref 0–100)

## 2015-01-12 LAB — PROTIME-INR
INR: 1.97 — ABNORMAL HIGH (ref 0.00–1.49)
Prothrombin Time: 22.3 seconds — ABNORMAL HIGH (ref 11.6–15.2)

## 2015-01-12 LAB — BRAIN NATRIURETIC PEPTIDE: B Natriuretic Peptide: 615 pg/mL — ABNORMAL HIGH (ref 0.0–100.0)

## 2015-01-12 LAB — LACTATE DEHYDROGENASE: LDH: 301 U/L — ABNORMAL HIGH (ref 98–192)

## 2015-01-12 NOTE — Patient Instructions (Signed)
1.  Take coumadin 6 mg today then resume 6 mg daily except 4 mg on Mon/Wed/Fri. Will re-check INR at home next Wednesday. 2.  No other changes in meds. 3.  Will call you with ABG results 4.  Return to [facility] in one month.

## 2015-01-12 NOTE — Progress Notes (Addendum)
Symptom  Yes  No  Details   Angina        x Activity:   Claudication        x How far:   Syncope        x When: Dizziness with orthostatic changes   Stroke        x   Orthopnea        x How many pillows:  1  PND        x How often:  BiPAP               x How many hrs: pt stopped BiPAP  Pedal edema               x   Abd fullness        x   N&V        x Good appetite   Diaphoresis        x When:   Bleeding        x       Nosebleed x 1  Urine color   Light - medium yellow  SOB         x  Activity: continues as before heart failure  Palpitations         x When:  ICD shock       N/A   Hospitlizaitons                x When/where/why:    ED visit         x When/where/why:  Other MD         x        When/who/why: Scott [name]  Activity    Cardiac rehab 2 x week   Fluid    2000  Diet    Low sodium   Vital signs: HR:  101 MAP BP:  98 Automatic BP: 99/84 (93) O2 Sat: 97 % Wt:  187.2  lbs  Last wt:  190.2 lbs Home wts: 176 - 178 lbs  Ht:  5'8"   LVAD interrogation reveals:  Speed:  9200 Flow:  4.4 Power: 5.1 PI: 5.8 Alarms: None Events: rare PI  Fixed speed:  9200  Low speed limit: 8600  Primary Controller: Replace back up battery in 25 months. Back up controller:  Replace back up battery in 25 months.   LVAD interrogation negative for any power spikes, speed drops/PI events. No alarms recorded on event log and none reported by patient/caregiver. Back up equipment is present. No adjustments were made to prescribed settings. Pt/caregiver deny any alarms or VAD equipment issues.   VAD coordinator reviewed daily log from home for daily temperature, weight, and VAD parameters. Pt is performing daily controller and system monitor self tests along with completing weekly and monthly maintenance for LVAD equipment.  LVAD equipment check completed and is in good working order. Back-up equipment present. LVAD education done on emergency procedures and precautions and reviewed exit site  care.    LVAD exit site:   VAD dressing removed and site care performed using sterile technique. Drive line exit site cleaned with Chlora prep applicators x 2, allowed to dry, and gauze dressing with aquacel strip re-applied. Exit site with complete tissue ingrowth, no  Erythema, tenderness, drainage, or foul odor noted. Continue weekly dressing changes; pt does not feel comfortable showering. Dressings provided for home use.   Wife using home INR monitor and performing INR checks weekly on patient.  Patient Instuctions: 1.  Take coumadin 6 mg today then resume 6 mg daily except 4 mg on Mon/Wed/Fri. Will re-check INR at home next Wednesday. 2.  No other changes in meds. 3.  Will call you with ABG results 4.  Return to [facility] in one month.  Patient ID: Scott [name], male   DOB: 1946/05/14, 69 y.o.   MRN: [id number]  Primary Cardiologist: Scott [name] INR : [facility] [place]  HPI: Scott [name] is a 69 y/o with h/o CAD with ankylosing spondylitis, DM2, carotid stenting, severe ischemic CM s/p anterior STEM with VT arrest in 12/15, large PE with pulmonary infarct in 2/15 s/p IVC filter and systolic HF with EF 28%.  He underwent HM II LVAD placement on 09/20/13. He had a prolonged hospital course due to recurrent chest bleeding, severe CO2 retention and RV failure.  Follow-up Presents today for follow-up. He continues to improve. Feeling better and better. Appetite is good. Denies dyspnea. No edema. Still with arthritic pain. He has been off bipap for a couple of weeks without apparent problems. Weight is down 3 lbs.  He is off midodrine. Doing cardiac rehab at [facility] cardiac rehab. High MAP today but MAPs from cardiac rehab have all been good.   No problems with VAD dressing. No bleeding, fevers, chills, melena, dark urine. Performing self test every day. Wife doing dressing changes.   Labs (7/16): K 4.7, creatinine 1.02, LDH 301, INR 1.97, HCT 38.2  LVAD interrogation reveals:  See LVAD  coordinator note above.   SH:  History   Social History  . Marital Status: Married    Spouse Name: N/A  . Number of Children: N/A  . Years of Education: N/A   Occupational History  . Not on file.   Social History Main Topics  . Smoking status: Former Smoker -- 0.30 packs/day for 0 years    Types: Cigarettes    Quit date: 06/12/1969  . 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  . Alcohol Use: No  . Drug Use: No  . Sexual Activity: Not Currently   Other Topics Concern  . Not on file   Social History Narrative    FH:  Family History  Problem Relation Age of Onset  . Hypertension Father   . Heart attack Father   . Heart Problems Sister     Past Medical History  Diagnosis Date  . Hyperlipidemia   . Hypertension   . Diabetes   . Nephrolithiasis   . Ankylosing spondylitis   . CAD (coronary artery disease)     a. stenting of LCx 2013; b. STEMI 06/12/14 s/p PCI to LAD complicated by post cath shock requiring IABP; VT s/p DCCV, EF 20%; c. NSTEMI 06/26/14 treated medically.  . Ischemic cardiomyopathy     a. echo 08/23/2014 EF <20%, dilated CM, mod MR/TR  . Acute on chronic respiratory failure     a. 08/2014 in setting of PE.  . Bilateral pulmonary embolism     a. 08/2014 - started on Coumadin. Retrievable IVC filter placed 08/27/14 due to RV strain and large clot burden.  . Right leg DVT     a. 08/2014.  Marland Kitchen Chronic systolic CHF (congestive heart failure)   . Hypotension   . Hemoptysis     a. 08/2014 possibly due to pulm infarct/PE.  Marland Kitchen Pleural effusion on right 08/2014 - small  . Leukocytosis   . Carotid artery disease  a. s/p stenting.  . Ventricular tachycardia     a. 06/2014 at time of MI, s/p DCCV.  Marland Kitchen Reactive thrombocytosis 09/10/2014  . Leukocytosis 09/10/2014    Current Outpatient Prescriptions  Medication Sig Dispense Refill  . acetaminophen (TYLENOL) 500 MG tablet Take 500 mg by mouth every 6 (six) hours as  needed.    . furosemide (LASIX) 20 MG tablet Every other day alternate 20 mg (1 pill) with 40 mg (2 pills) 60 tablet 6  . metaxalone (SKELAXIN) 800 MG tablet Take 1 tablet (800 mg total) by mouth daily as needed for muscle spasms. 60 tablet 6  . pantoprazole (PROTONIX) 40 MG tablet Take 1 tablet (40 mg total) by mouth at bedtime. 30 tablet 6  . potassium chloride SA (K-DUR,KLOR-CON) 20 MEQ tablet Take 1 tablet (20 mEq total) by mouth as directed. Take 1 pill with 20 mg Lasix daily and 2 pills with 40 mg Lasix daily 60 tablet 6  . rosuvastatin (CRESTOR) 10 MG tablet Take 10 mg by mouth daily.    . traZODone (DESYREL) 100 MG tablet Take 1 tablet (100 mg total) by mouth at bedtime. 30 tablet 6  . warfarin (COUMADIN) 4 MG tablet Take 1 tablet (4 mg total) by mouth daily at 6 PM. Take 1 pill every day except 1/2 pill Wednesday and Fridays. Or otherwise prescribed. (Patient taking differently: Take 4 mg by mouth daily at 6 PM. Patient taking one 4 mg tablet every day except Monday, Wed, and Friday.) 30 tablet 6  . warfarin (COUMADIN) 4 MG tablet Take 6 mg by mouth daily. Tues, Thurs, Sat, Sun    . enoxaparin (LOVENOX) 40 MG/0.4ML injection Inject 0.4 mLs (40 mg total) into the skin daily. (Patient not taking: Reported on 01/12/2015) 10 Syringe 2   No current facility-administered medications for this encounter.     Vital signs: BP 98/0 mmHg  Pulse 101  Ht 5\' 8"  (1.727 m)  Wt 187 lb 3.2 oz (84.913 kg)  BMI 28.47 kg/m2  SpO2 97%   PHYSICAL EXAM: General:  Well appearing.  No resp difficulty HEENT: normal Neck: limited ROM, JVP 7 cm. Carotids 2+ bilaterally; no bruits. No lymphadenopathy or thryomegaly appreciated. Cor: RR + LVAD hum Lungs: clear  Abdomen: soft, nontender, nondistended. Driveline site clear Extremities: no cyanosis, clubbing, rash. No ankle edema Neuro: alert & orientedx3, cranial nerves grossly intact. Moves all 4 extremities w/o difficulty. Affect pleasant.   ASSESSMENT &  PLAN: 1. Chronic systolic HF: Ischemic cardiomyopathy, EF 20% s/p HMII LVAD 09/20/13.  Doing very well post VAD, NYHA class II. He does not appear volume overloaded on exam.  - Continue current Lasix.  Creatinine ok today.  2. CAD s/p Anterior STEMI on 06/12/14 with stenting of LAD: Stable. continue statin 3. VAD: Interrogated personally. Parameters stable.  - Driveline site looks good 4. Ankylosing spondylitis. - off NSAIDS. Skelaxin as needed.  5. Bilateral PE with pulmonary infarct: S/P IVC filter 08/27/2014.  Continue coumadin.  Pulmonary plans repeat CTA chest with bilateral lower extremity venous dopplers.  If this looks ok, plan to remove IVC filter after 6 months total.  6. COPD/CO2 retention:  Improved. He is off bipap.  ABG was done today => 7.42/40/87.  Ok to stay off Bipap.  7. Anticoagulation: INR low as he has been eating greens. Long talk about need to get consistent diet and to maintain INR in goal range to avoid risk of pump thrombosis - Continue coumadin. Goal INR 1.8-2.2 due to  bleeding. Off ASA.    Dalton McLean,MD 01/12/2015

## 2015-01-13 ENCOUNTER — Encounter: Payer: Medicare Other | Admitting: *Deleted

## 2015-01-13 DIAGNOSIS — Z95811 Presence of heart assist device: Secondary | ICD-10-CM | POA: Diagnosis not present

## 2015-01-13 DIAGNOSIS — I509 Heart failure, unspecified: Secondary | ICD-10-CM

## 2015-01-13 DIAGNOSIS — Z9861 Coronary angioplasty status: Secondary | ICD-10-CM

## 2015-01-13 DIAGNOSIS — Z955 Presence of coronary angioplasty implant and graft: Secondary | ICD-10-CM | POA: Diagnosis not present

## 2015-01-13 DIAGNOSIS — I252 Old myocardial infarction: Secondary | ICD-10-CM | POA: Diagnosis not present

## 2015-01-13 NOTE — Progress Notes (Signed)
Daily Session Note  Patient Details  Name: Scott [name] MRN: [id number] Date of Birth: May 19, 1946 Referring Provider:  [name], MD  Encounter Date: 01/13/2015  Check In:     Session Check In - 01/13/15 1054    VAD patient   Has back up controller? Yes   Has spare charged batteries? Yes   Has battery cables? Yes   Has compatible battery clips? Yes         Goals Met:  Proper associated with RPD/PD & O2 Sat  Goals Unmet:  Not Applicable  Goals Comments: Doing well on the Biostep exercise equipment (like a recumbent elliptical).    Dr. Emily Filbert is Medical Director for [facility] and [facility].

## 2015-01-18 ENCOUNTER — Encounter: Payer: Self-pay | Admitting: *Deleted

## 2015-01-18 ENCOUNTER — Telehealth (HOSPITAL_COMMUNITY): Payer: Self-pay | Admitting: *Deleted

## 2015-01-18 ENCOUNTER — Encounter: Payer: Medicare Other | Admitting: *Deleted

## 2015-01-18 DIAGNOSIS — I252 Old myocardial infarction: Secondary | ICD-10-CM | POA: Diagnosis not present

## 2015-01-18 DIAGNOSIS — Z95811 Presence of heart assist device: Secondary | ICD-10-CM

## 2015-01-18 DIAGNOSIS — Z955 Presence of coronary angioplasty implant and graft: Secondary | ICD-10-CM | POA: Diagnosis not present

## 2015-01-18 DIAGNOSIS — I509 Heart failure, unspecified: Secondary | ICD-10-CM | POA: Diagnosis not present

## 2015-01-18 DIAGNOSIS — I213 ST elevation (STEMI) myocardial infarction of unspecified site: Secondary | ICD-10-CM

## 2015-01-18 DIAGNOSIS — Z9861 Coronary angioplasty status: Secondary | ICD-10-CM

## 2015-01-18 NOTE — Telephone Encounter (Signed)
Wife called to report pt missed 4 mg coumadin dose last evening; he is scheduled to take 6 mg today. Instructed her to give pt 8 mg today per Dr. Haroldine Laws and check home INR as scheduled tomorrow. Wife verbalized understanding of same.

## 2015-01-18 NOTE — Progress Notes (Signed)
Daily Session Note  Patient Details  Name: Scott [name] MRN: [id number] Date of Birth: 11/23/1945 Referring Provider:  Walker, John B III, MD  Encounter Date: 01/18/2015  Check In:     Session Check In - 01/18/15 0937    Check-In   Staff Present Renee MacMillan MS, ACSM CEP Exercise Physiologist;Stacey Joyce RRT, RCP Respiratory Therapist   ER physicians immediately available to respond to emergencies See telemetry face sheet for immediately available ER MD   Medication changes reported     No   Fall or balance concerns reported    No   Warm-up and Cool-down Performed on first and last piece of equipment   VAD Patient? Yes   VAD patient   Has back up controller? Yes   Has spare charged batteries? Yes   Has battery cables? Yes   Has compatible battery clips? Yes   Pain Assessment   Currently in Pain? No/denies           Exercise Prescription Changes - 01/18/15 0900    Exercise Review   Progression Yes   Response to Exercise   Blood Pressure (Admit) 88/0 mmHg  Doppler   Symptoms No   Duration Progress to 30 minutes of continuous aerobic without signs/symptoms of physical distress   Intensity THRR unchanged  REST + 20   Progression Continue progressive overload as per policy without signs/symptoms or physical distress.   Resistance Training   Training Prescription Yes   Weight 3   Reps 10-15   Interval Training   Interval Training No   Recumbant Bike   Level 4   RPM 40   Minutes 15   NuStep   Level 2   Watts 35   Minutes 20   Recumbant Elliptical   Level 3   Watts 35   Minutes 20      Goals Met:  Exercise tolerated well Personal goals reviewed No report of cardiac concerns or symptoms Strength training completed today  Goals Unmet:  Not Applicable  Goals Comments:  [name]'s legs were feeling tired today so we lowered his level on the recumbent bike.   Dr. Mark Miller is Medical Director for [facility] and [facility]. 

## 2015-01-18 NOTE — Progress Notes (Signed)
Cardiac Individual Treatment Plan  Patient Details  Name: RYSHAWN SANZONE MRN: [id number] Date of Birth: 1945/10/16 Referring Provider:  [name], MD  Initial Encounter Date:  12/13/2014  Visit Diagnosis: LVAD (left ventricular assist device) present  S/P PTCA (percutaneous transluminal coronary angioplasty)  Patient's Home Medications on Admission:  Current outpatient prescriptions:  .  acetaminophen (TYLENOL) 500 MG tablet, Take 500 mg by mouth every 6 (six) hours as needed., Disp: , Rfl:  .  enoxaparin (LOVENOX) 40 MG/0.4ML injection, Inject 0.4 mLs (40 mg total) into the skin daily. (Patient not taking: Reported on 01/12/2015), Disp: 10 Syringe, Rfl: 2 .  furosemide (LASIX) 20 MG tablet, Every other day alternate 20 mg (1 pill) with 40 mg (2 pills), Disp: 60 tablet, Rfl: 6 .  metaxalone (SKELAXIN) 800 MG tablet, Take 1 tablet (800 mg total) by mouth daily as needed for muscle spasms., Disp: 60 tablet, Rfl: 6 .  midodrine (PROAMATINE) 2.5 MG tablet, , Disp: , Rfl: 3 .  pantoprazole (PROTONIX) 40 MG tablet, Take 1 tablet (40 mg total) by mouth at bedtime., Disp: 30 tablet, Rfl: 6 .  potassium chloride SA (K-DUR,KLOR-CON) 20 MEQ tablet, Take 1 tablet (20 mEq total) by mouth as directed. Take 1 pill with 20 mg Lasix daily and 2 pills with 40 mg Lasix daily, Disp: 60 tablet, Rfl: 6 .  rosuvastatin (CRESTOR) 10 MG tablet, Take 10 mg by mouth daily., Disp: , Rfl:  .  traZODone (DESYREL) 100 MG tablet, Take 1 tablet (100 mg total) by mouth at bedtime., Disp: 30 tablet, Rfl: 6 .  warfarin (COUMADIN) 4 MG tablet, Take 1 tablet (4 mg total) by mouth daily at 6 PM. Take 1 pill every day except 1/2 pill Wednesday and Fridays. Or otherwise prescribed. (Patient taking differently: Take 4 mg by mouth daily at 6 PM. Patient taking one 4 mg tablet every day except Monday, Wed, and Friday.), Disp: 30 tablet, Rfl: 6 .  warfarin (COUMADIN) 4 MG tablet, Take 6 mg by mouth daily. Tues, Thurs,  Sat, Sun, Disp: , Rfl:   Past Medical History: Past Medical History  Diagnosis Date  . Hyperlipidemia   . Hypertension   . Diabetes   . Nephrolithiasis   . Ankylosing spondylitis   . CAD (coronary artery disease)     a. stenting of LCx 2013; b. STEMI 06/12/14 s/p PCI to LAD complicated by post cath shock requiring IABP; VT s/p DCCV, EF 20%; c. NSTEMI 06/26/14 treated medically.  . Ischemic cardiomyopathy     a. echo 08/23/2014 EF <20%, dilated CM, mod MR/TR  . Acute on chronic respiratory failure     a. 08/2014 in setting of PE.  . Bilateral pulmonary embolism     a. 08/2014 - started on Coumadin. Retrievable IVC filter placed 08/27/14 due to RV strain and large clot burden.  . Right leg DVT     a. 08/2014.  Marland Kitchen Chronic systolic CHF (congestive heart failure)   . Hypotension   . Hemoptysis     a. 08/2014 possibly due to pulm infarct/PE.  Marland Kitchen Pleural effusion on right 08/2014 - small  . Leukocytosis   . Carotid artery disease     a. s/p stenting.  . Ventricular tachycardia     a. 06/2014 at time of MI, s/p DCCV.  Marland Kitchen Reactive thrombocytosis 09/10/2014  . Leukocytosis 09/10/2014    Tobacco Use: History  Smoking status  . Former Smoker -- 0.30 packs/day for 0 years  . Types: Cigarettes  .  Quit date: 06/12/1969  Smokeless tobacco  . Never Used    Comment: pt smoked while in [employer] 2-3 cig x 6 months.    Labs: Recent Review Flowsheet Data    Labs for ITP Cardiac and Pulmonary Rehab Latest Ref Rng 10/12/2014 10/12/2014 10/13/2014 10/14/2014 01/12/2015   PHART 7.350 - 7.450 7.335(L) - - - 7.416   PCO2ART 35.0 - 45.0 mmHg 68.6(HH) - - - 40.4   HCO3 20.0 - 24.0 mEq/L 36.6(H) - - - 25.4(H)   TCO2 0 - 100 mmol/L 39 31 34 - 26.7   O2SAT - 99.0 - 86.9 87.0 96.7       Exercise Target Goals:    Exercise Program Goal: Individual exercise prescription set with THRR, safety & activity barriers. Participant demonstrates ability to understand and report RPE using BORG scale, to self-measure  pulse accurately, and to acknowledge the importance of the exercise prescription.  Exercise Prescription Goal: Starting with aerobic activity 30 plus minutes a day, 3 days per week for initial exercise prescription. Provide home exercise prescription and guidelines that participant acknowledges understanding prior to discharge.  Activity Barriers & Risk Stratification:     Activity Barriers & Risk Stratification - 12/13/14 1405    Activity Barriers & Risk Stratification   Activity Barriers Arthritis;Back Problems;Neck/Spine Problems   Risk Stratification High      6 Minute Walk:     6 Minute Walk      12/13/14 1516       6 Minute Walk   Phase Initial     Distance 735 feet     Walk Time 6 minutes     Resting HR 97 bpm     Max Ex. HR 112 bpm     RPE 11     Symptoms No        Initial Exercise Prescription:     Initial Exercise Prescription - 12/13/14 1500    Date of Initial Exercise Prescription   Date 12/13/14   Treadmill   MPH 1   Grade 0   Minutes 10   Bike   Level 0.4   Minutes 15   Recumbant Bike   Level 2   Watts 20   Minutes 15   NuStep   Level 2   Watts 30   Minutes 15   Arm Ergometer   Level 1   Watts 5   Minutes 10   Arm/Foot Ergometer   Level 1   Watts 8   Minutes 15   Cybex   Level 1   RPM 30   Minutes 15   Recumbant Elliptical   Level 1   Watts 15   Minutes 15   REL-XR   Level 2   Watts 20   Minutes 15   Prescription Details   Frequency (times per week) 3   Duration Progress to 30 minutes of continuous aerobic without signs/symptoms of physical distress   Intensity   THRR REST +  20   Ratings of Perceived Exertion 11-13   Perceived Dyspnea 2-4   Progression Continue progressive overload as per policy without signs/symptoms or physical distress.   Resistance Training   Training Prescription Yes   Weight 1   Reps 10-12      Exercise Prescription Changes:     Exercise Prescription Changes      12/16/14 0900 12/20/14  1600 01/11/15 0600 01/18/15 0900     Exercise Review   Progression Yes Yes Yes Yes    Response to  Exercise   Blood Pressure (Admit)  92/0 mmHg  Doppler 88/0 mmHg  Doppler 88/0 mmHg  Doppler    Blood Pressure (Exit)  84/0 mmHg 88/0 mmHg     Heart Rate (Admit)  117 bpm 100 bpm     Heart Rate (Exercise)  113 bpm 117 bpm     Heart Rate (Exit)  108 bpm 107 bpm     Rating of Perceived Exertion (Exercise)  13 12     Symptoms  No No No    Duration  Progress to 30 minutes of continuous aerobic without signs/symptoms of physical distress Progress to 30 minutes of continuous aerobic without signs/symptoms of physical distress Progress to 30 minutes of continuous aerobic without signs/symptoms of physical distress    Intensity  --  REST + 20 THRR unchanged  REST + 20 THRR unchanged  REST + 20    Progression  Continue progressive overload as per policy without signs/symptoms or physical distress. Continue progressive overload as per policy without signs/symptoms or physical distress. Continue progressive overload as per policy without signs/symptoms or physical distress.    Resistance Training   Training Prescription  Yes Yes Yes    Weight  2lb 3 3    Reps  10-15 10-15 10-15    Interval Training   Interval Training   No No    Recumbant Bike   Level 3 3 4 4     RPM 35 35 40 40    Minutes  10 15 15     NuStep   Level  2 2 2     Watts  35 35 35    Minutes  20 20 20     Recumbant Elliptical   Level   3 3    Watts   20 35    Minutes   20 20       Discharge Exercise Prescription (Final Exercise Prescription Changes):     Exercise Prescription Changes - 01/18/15 0900    Exercise Review   Progression Yes   Response to Exercise   Blood Pressure (Admit) 88/0 mmHg  Doppler   Symptoms No   Duration Progress to 30 minutes of continuous aerobic without signs/symptoms of physical distress   Intensity THRR unchanged  REST + 20   Progression Continue progressive overload as per policy without  signs/symptoms or physical distress.   Resistance Training   Training Prescription Yes   Weight 3   Reps 10-15   Interval Training   Interval Training No   Recumbant Bike   Level 4   RPM 40   Minutes 15   NuStep   Level 2   Watts 35   Minutes 20   Recumbant Elliptical   Level 3   Watts 35   Minutes 20      Nutrition:  Target Goals: Understanding of nutrition guidelines, daily intake of sodium 1500mg , cholesterol 200mg , calories 30% from fat and 7% or less from saturated fats, daily to have 5 or more servings of fruits and vegetables.  Biometrics:     Pre Biometrics - 12/13/14 1523    Pre Biometrics   Height 5' 8.5" (1.74 m)   Weight 186 lb 11.2 oz (84.687 kg)   Waist Circumference 40 inches   Hip Circumference 42 inches   Waist to Hip Ratio 0.95 %   BMI (Calculated) 28       Nutrition Therapy Plan and Nutrition Goals:     Nutrition Therapy & Goals - 12/21/14 1213  Nutrition Therapy   Diet Kunio has deferred meeting with RD here as he has had education through the Heart Failure Clinic at [facility]      Nutrition Discharge: Rate Your Plate Scores:   Nutrition Goals Re-Evaluation:   Psychosocial: Target Goals: Acknowledge presence or absence of depression, maximize coping skills, provide positive support system. Participant is able to verbalize types and ability to use techniques and skills needed for reducing stress and depression.  Initial Review & Psychosocial Screening:     Initial Psych Review & Screening - 12/13/14 1418    Initial Review   Current issues with Current Sleep Concerns   Family Dynamics   Good Support System? Yes   Comments Does have counselor with LVAD program.      Barriers   Psychosocial barriers to participate in program There are no identifiable barriers or psychosocial needs.;The patient should benefit from training in stress management and relaxation.   Screening Interventions   Interventions Encouraged to exercise       Quality of Life Scores:     Quality of Life - 12/15/14 1122    Quality of Life Scores   Health/Function Pre 14.73 %   Socioeconomic Pre 23 %   Psych/Spiritual Pre 23.21 %   Family Pre 20.71 %   GLOBAL Pre 18.77 %      PHQ-9:     Recent Review Flowsheet Data    Depression screen [facility] 2/9 12/13/2014   Decreased Interest 2   Down, Depressed, Hopeless 0   PHQ - 2 Score 2   Altered sleeping 2   Tired, decreased energy 3   Change in appetite 0   Feeling bad or failure about yourself  0   Trouble concentrating 0   Moving slowly or fidgety/restless 3   Suicidal thoughts 0   PHQ-9 Score 10   Difficult doing work/chores Very difficult      Psychosocial Evaluation and Intervention:   Psychosocial Re-Evaluation:   Vocational Rehabilitation: Provide vocational rehab assistance to qualifying candidates.   Vocational Rehab Evaluation & Intervention:     Vocational Rehab - 12/13/14 1407    Initial Vocational Rehab Evaluation & Intervention   Assessment shows need for Vocational Rehabilitation No      Education: Education Goals: Education classes will be provided on a weekly basis, covering required topics. Participant will state understanding/return demonstration of topics presented.  Learning Barriers/Preferences:     Learning Barriers/Preferences - 12/13/14 1406    Learning Barriers/Preferences   Learning Barriers Reading   Learning Preferences Video      Education Topics: General Nutrition Guidelines/Fats and Fiber: -Group instruction provided by verbal, written material, models and posters to present the general guidelines for heart healthy nutrition. Gives an explanation and review of dietary fats and fiber.   Controlling Sodium/Reading Food Labels: -Group verbal and written material supporting the discussion of sodium use in heart healthy nutrition. Review and explanation with models, verbal and written materials for utilization of the food  label.   Exercise Physiology & Risk Factors: - Group verbal and written instruction with models to review the exercise physiology of the cardiovascular system and associated critical values. Details cardiovascular disease risk factors and the goals associated with each risk factor.   Aerobic Exercise & Resistance Training: - Gives group verbal and written discussion on the health impact of inactivity. On the components of aerobic and resistive training programs and the benefits of this training and how to safely progress through these programs.  Flexibility, Balance, General Exercise Guidelines: - Provides group verbal and written instruction on the benefits of flexibility and balance training programs. Provides general exercise guidelines with specific guidelines to those with heart or lung disease. Demonstration and skill practice provided.   Stress Management: - Provides group verbal and written instruction about the health risks of elevated stress, cause of high stress, and healthy ways to reduce stress.   Depression: - Provides group verbal and written instruction on the correlation between heart/lung disease and depressed mood, treatment options, and the stigmas associated with seeking treatment.   Anatomy & Physiology of the Heart: - Group verbal and written instruction and models provide basic cardiac anatomy and physiology, with the coronary electrical and arterial systems. Review of: AMI, Angina, Valve disease, Heart Failure, Cardiac Arrhythmia, Pacemakers, and the ICD.   Cardiac Procedures: - Group verbal and written instruction and models to describe the testing methods done to diagnose heart disease. Reviews the outcomes of the test results. Describes the treatment choices: Medical Management, Angioplasty, or Coronary Bypass Surgery.   Cardiac Medications: - Group verbal and written instruction to review commonly prescribed medications for heart disease. Reviews the  medication, class of the drug, and side effects. Includes the steps to properly store meds and maintain the prescription regimen.   Go Sex-Intimacy & Heart Disease, Get SMART - Goal Setting: - Group verbal and written instruction through game format to discuss heart disease and the return to sexual intimacy. Provides group verbal and written material to discuss and apply goal setting through the application of the S.M.A.R.T. Method.   Other Matters of the Heart: - Provides group verbal, written materials and models to describe Heart Failure, Angina, Valve Disease, and Diabetes in the realm of heart disease. Includes description of the disease process and treatment options available to the cardiac patient.          Cardiac Rehab from 01/13/2015 in [facility] Cardiac Rehab   Date  12/16/14   Educator  CE   Instruction Review Code  2- meets goals/outcomes      Exercise & Equipment Safety: - Individual verbal instruction and demonstration of equipment use and safety with use of the equipment.      Cardiac Rehab from 01/13/2015 in [facility] Cardiac Rehab   Date  12/13/14   Educator  S Haydn Cush   Instruction Review Code  2- meets goals/outcomes      Infection Prevention: - Provides verbal and written material to individual with discussion of infection control including proper hand washing and proper equipment cleaning during exercise session.      Cardiac Rehab from 01/13/2015 in [facility] Cardiac Rehab   Date  12/13/14   Educator  S Rhaelyn Giron   Instruction Review Code  2- meets goals/outcomes      Falls Prevention: - Provides verbal and written material to individual with discussion of falls prevention and safety.      Cardiac Rehab from 01/13/2015 in [facility] Cardiac Rehab   Date  12/13/14   Educator  S Elmar Antigua   Instruction Review Code  2- meets goals/outcomes      Diabetes: - Individual verbal and written instruction to review signs/symptoms of diabetes, desired ranges of glucose level fasting, after meals  and with exercise. Advice that pre and post exercise glucose checks will be done for 3 sessions at entry of program.    Knowledge Questionnaire Score:     Knowledge Questionnaire Score - 12/21/14 1214    Knowledge Questionnaire Score   Pre Score  deferred per participant      Personal Goals and Risk Factors at Admission:     Personal Goals and Risk Factors at Admission - 12/13/14 1410    Personal Goals and Risk Factors on Admission    Weight Management No   Increase Aerobic Exercise and Physical Activity Yes   Intervention While in program, learn and follow the exercise prescription taught. Start at a low level workload and increase workload after able to maintain previous level for 30 minutes. Increase time before increasing intensity.   Develop more efficient breathing techniques such as purse lipped breathing and diaphragmatic breathing; and practicing self-pacing with activity Yes   Intervention While in program, learn and utilize the specific breathing techniques taught to you. Continue to practice and use the techniques as needed.   Diabetes No   Hypertension No   Lipids Yes   Goal Cholesterol controlled with medications as prescribed, with individualized exercise RX and with personalized nutrition plan. Value goals: LDL < 70mg , HDL > 40mg . Participant states understanding of desired cholesterol values and following prescriptions.   Intervention Provide nutrition & aerobic exercise along with prescribed medications to achieve LDL 70mg , HDL >40mg .      Personal Goals and Risk Factors Review:      Goals and Risk Factor Review      01/18/15 1504           Increase Aerobic Exercise and Physical Activity   Goals Progress/Improvement seen  Yes       Comments Progressing with exercise prescription       Abnormal Lipids   Goal Cholesterol controlled with medications as prescribed, with individualized exercise RX and with personalized nutrition plan. Value goals: LDL < 70mg ,  HDL > 40mg . Participant states understanding of desired cholesterol values and following prescriptions.       Progress seen towards goals Unknown       Comments no labs to compare          Personal Goals Discharge:     Comments: 30 day review. Continue with ITP.

## 2015-01-19 ENCOUNTER — Ambulatory Visit: Payer: Self-pay | Admitting: Infectious Diseases

## 2015-01-19 ENCOUNTER — Other Ambulatory Visit: Payer: Self-pay | Admitting: *Deleted

## 2015-01-19 DIAGNOSIS — Z95811 Presence of heart assist device: Secondary | ICD-10-CM

## 2015-01-19 DIAGNOSIS — Z5181 Encounter for therapeutic drug level monitoring: Secondary | ICD-10-CM

## 2015-01-19 DIAGNOSIS — I2699 Other pulmonary embolism without acute cor pulmonale: Secondary | ICD-10-CM

## 2015-01-19 DIAGNOSIS — Z9861 Coronary angioplasty status: Secondary | ICD-10-CM

## 2015-01-19 LAB — POCT INR: INR: 1.8

## 2015-01-19 NOTE — Progress Notes (Signed)
CSW met with patient and his wife for 3 month post VAD reassessment. Patient reports medical regimen is going well. He did mention that he is doing the home INR machine and has recently had some issues with his INR "jumping all over". His wife spoke at length about his diet and possibly added tuna contributed to the fluctuations in INR. Patient reports he was recently diagnosed with cataracts although not pursuing surgery yet. Patient reports he is not driving yet because of the cataracts.  His wife provides all the driving needs at the moment. His wife is primary caregiver and seems to be comfortable with dressing chnages and care of patient. Patient reports he is independent with ADL's but added that some things "I can do but is a major effort". He stated "going in the right direction and getting better'. He denies any issues with finances or insurance. He states he has been having some sleep issues (5-6 hrs a night and is tired as a result. He denies any stress although admits that he yells a little when he gets aggravated. He denies any mental health issues or need for any intervention. He scored a 2 on the [facility] and an 8 on the HADS (pre-implant score was a 13). His wife scored an 11 on the HADS (pre-implant score was a 10). Wife states "everything revoles around taking care of him". Wife states he continues to get aggrevated but it is not new and she manages it well. Patient and wife plan to attend Support Group next week. Patient and wife appear to be coping well under the circumstances and reach out for help and assistance when needed. CSW will continue to follow for support and any other needs that may arise. Raquel Sarna, [place]

## 2015-01-20 ENCOUNTER — Telehealth: Payer: Self-pay | Admitting: *Deleted

## 2015-01-20 DIAGNOSIS — I509 Heart failure, unspecified: Secondary | ICD-10-CM | POA: Diagnosis not present

## 2015-01-20 DIAGNOSIS — I252 Old myocardial infarction: Secondary | ICD-10-CM | POA: Diagnosis not present

## 2015-01-20 DIAGNOSIS — Z95811 Presence of heart assist device: Secondary | ICD-10-CM

## 2015-01-20 DIAGNOSIS — Z955 Presence of coronary angioplasty implant and graft: Secondary | ICD-10-CM | POA: Diagnosis not present

## 2015-01-20 NOTE — Telephone Encounter (Signed)
They would prefer not to meet with the Cardiac Rehab registered dietician.

## 2015-01-20 NOTE — Progress Notes (Signed)
Daily Session Note  Patient Details  Name: ANTONIUS HARTLAGE MRN: [id number] Date of Birth: [birth year] Referring Provider:  [name], MD  Encounter Date: 01/20/2015  Check In:     Session Check In - 01/20/15 0904    Check-In   Staff Present Lestine Box BS, ACSM EP-C, Exercise Physiologist;Carroll Enterkin RN, BSN;Other   ER physicians immediately available to respond to emergencies See telemetry face sheet for immediately available ER MD   Medication changes reported     No   Fall or balance concerns reported    No   Warm-up and Cool-down Performed on first and last piece of equipment   VAD Patient? No   VAD patient   Has back up controller? Yes   Has spare charged batteries? Yes   Has battery cables? Yes   Has compatible battery clips? Yes   Pain Assessment   Currently in Pain? No/denies         Goals Met:  Independence with exercise equipment Exercise tolerated well No report of cardiac concerns or symptoms Strength training completed today  Goals Unmet:  Not Applicable  Goals Comments: Progressing well with his exercise prescription.    Dr. Emily Filbert is Medical Director for [place] [facility] and [facility].

## 2015-01-24 ENCOUNTER — Telehealth: Payer: Self-pay | Admitting: *Deleted

## 2015-01-24 ENCOUNTER — Ambulatory Visit
Admission: RE | Admit: 2015-01-24 | Discharge: 2015-01-24 | Disposition: A | Discharge status: Home / Self Care | Payer: Medicare Other | Source: Ambulatory Visit | Attending: Internal Medicine | Admitting: Internal Medicine

## 2015-01-24 DIAGNOSIS — I82401 Acute embolism and thrombosis of unspecified deep veins of right lower extremity: Secondary | ICD-10-CM | POA: Diagnosis not present

## 2015-01-24 DIAGNOSIS — I82411 Acute embolism and thrombosis of right femoral vein: Secondary | ICD-10-CM

## 2015-01-24 NOTE — Telephone Encounter (Signed)
-----   Message from Vilinda Boehringer, MD sent at 01/24/2015  1:36 PM EDT ----- Regarding: U\S results Please let patient know the following results of the U\S of his lower extremities  1. No evidence acute DVT within the right lower extremity. 2. No change to minimal improvement in chronic DVT within one of the paired right femoral veins. 3. No evidence of acute or chronic DVT within the left lower Extremity.  Further details can be discussed at follow up. He will need to discuss with his cardiologist about IVC Filter removal.   Thank you Dr. Stevenson Clinch

## 2015-01-24 NOTE — Telephone Encounter (Signed)
Pt  Aware of U/S result and that he would need to f/u with cardiology for removal of IVC filter.

## 2015-01-25 ENCOUNTER — Encounter: Payer: Medicare Other | Admitting: *Deleted

## 2015-01-25 DIAGNOSIS — I509 Heart failure, unspecified: Secondary | ICD-10-CM | POA: Diagnosis not present

## 2015-01-25 DIAGNOSIS — Z955 Presence of coronary angioplasty implant and graft: Secondary | ICD-10-CM | POA: Diagnosis not present

## 2015-01-25 DIAGNOSIS — I252 Old myocardial infarction: Secondary | ICD-10-CM | POA: Diagnosis not present

## 2015-01-25 DIAGNOSIS — I213 ST elevation (STEMI) myocardial infarction of unspecified site: Secondary | ICD-10-CM

## 2015-01-25 DIAGNOSIS — Z95811 Presence of heart assist device: Secondary | ICD-10-CM

## 2015-01-25 NOTE — Progress Notes (Addendum)
Daily Session Note  Patient Details  Name: JAHDEN SCHARA MRN: [id number] Date of Birth: 01/12/1946 Referring Provider:  [name], MD  Encounter Date: 01/25/2015  Check In:     Session Check In - 01/25/15 1007    Check-In   Staff Present Candiss Norse MS, ACSM CEP Exercise Physiologist;Diane Mariana Arn, BSN   ER physicians immediately available to respond to emergencies See telemetry face sheet for immediately available ER MD   Medication changes reported     No   Fall or balance concerns reported    No   Warm-up and Cool-down Performed on first and last piece of equipment   VAD Patient? Yes   VAD patient   Has back up controller? Yes   Has spare charged batteries? Yes   Has battery cables? Yes   Has compatible battery clips? Yes   Pain Assessment   Currently in Pain? No/denies         Goals Met:  Exercise tolerated well Personal goals reviewed No report of cardiac concerns or symptoms Strength training completed today  Goals Unmet:  Not Applicable  Goals Comments: Siddhartha was able to go for 20 minutes on the recumbent bike today and did very well with it. He was encouraged by this increase in his exercise capacity. Jadis and his wife stated they do not need to meet with the dietitian as they have had extensive education about his diet.  Appointment with dietitian cancelled for today.     Dr. Emily Filbert is Medical Director for [place] and [facility].

## 2015-01-26 ENCOUNTER — Ambulatory Visit (HOSPITAL_COMMUNITY): Payer: Self-pay | Admitting: *Deleted

## 2015-01-26 ENCOUNTER — Other Ambulatory Visit (HOSPITAL_COMMUNITY): Payer: Self-pay | Admitting: *Deleted

## 2015-01-26 DIAGNOSIS — Z7901 Long term (current) use of anticoagulants: Secondary | ICD-10-CM | POA: Diagnosis not present

## 2015-01-26 DIAGNOSIS — Z95811 Presence of heart assist device: Secondary | ICD-10-CM

## 2015-01-26 LAB — PROTIME-INR: INR: 2 — AB (ref <=1.1)

## 2015-01-26 MED ORDER — WARFARIN SODIUM 6 MG PO TABS: 6.0000 mg | ORAL_TABLET | Freq: Every day | ORAL | Status: DC

## 2015-01-27 DIAGNOSIS — Z95811 Presence of heart assist device: Secondary | ICD-10-CM | POA: Diagnosis not present

## 2015-01-27 DIAGNOSIS — I509 Heart failure, unspecified: Secondary | ICD-10-CM

## 2015-01-27 DIAGNOSIS — Z955 Presence of coronary angioplasty implant and graft: Secondary | ICD-10-CM | POA: Diagnosis not present

## 2015-01-27 DIAGNOSIS — I252 Old myocardial infarction: Secondary | ICD-10-CM | POA: Diagnosis not present

## 2015-01-27 NOTE — Progress Notes (Signed)
Daily Session Note  Patient Details  Name: MICHAH MINTON MRN: [id number] Date of Birth: 1945/10/18 Referring Provider:  [name], MD  Encounter Date: 01/27/2015  Check In:     Session Check In - 01/27/15 0959    Check-In   Staff Present Heath Lark RN, BSN, CCRP;Darcella Shiffman BS, ACSM EP-C, Exercise Physiologist;Other   ER physicians immediately available to respond to emergencies See telemetry face sheet for immediately available ER MD   Medication changes reported     No   Fall or balance concerns reported    No   Warm-up and Cool-down Performed on first and last piece of equipment   VAD Patient? Yes   VAD patient   Has back up controller? Yes   Has spare charged batteries? Yes   Has battery cables? Yes   Has compatible battery clips? Yes   Pain Assessment   Currently in Pain? No/denies         Goals Met:  Proper associated with RPD/PD & O2 Sat Independence with exercise equipment Improved SOB with ADL's Exercise tolerated well No report of cardiac concerns or symptoms Strength training completed today  Goals Unmet:  Not Applicable  Goals Comments: Dequann stated that his strength is returning steadily, is ready for the prolonged hot weather to subside so he can get out more.   Dr. Emily Filbert is Medical Director for [place] and [facility].

## 2015-02-01 ENCOUNTER — Encounter: Payer: Medicare Other | Attending: Internal Medicine | Admitting: *Deleted

## 2015-02-01 DIAGNOSIS — I213 ST elevation (STEMI) myocardial infarction of unspecified site: Secondary | ICD-10-CM

## 2015-02-01 DIAGNOSIS — Z95811 Presence of heart assist device: Secondary | ICD-10-CM | POA: Diagnosis not present

## 2015-02-01 DIAGNOSIS — I509 Heart failure, unspecified: Secondary | ICD-10-CM | POA: Diagnosis not present

## 2015-02-01 DIAGNOSIS — I252 Old myocardial infarction: Secondary | ICD-10-CM | POA: Insufficient documentation

## 2015-02-01 DIAGNOSIS — Z955 Presence of coronary angioplasty implant and graft: Secondary | ICD-10-CM | POA: Diagnosis not present

## 2015-02-01 NOTE — Progress Notes (Signed)
Daily Session Note  Patient Details  Name: Scott [name] MRN: [id number] Date of Birth: January 12, 1946 Referring Provider:  [name], MD  Encounter Date: 02/01/2015  Check In:     Session Check In - 02/01/15 1046    Check-In   Staff Present Diane Joya Gaskins RN, Drusilla Kanner MS, ACSM CEP Exercise Physiologist   ER physicians immediately available to respond to emergencies See telemetry face sheet for immediately available ER MD   Medication changes reported     No   Fall or balance concerns reported    No   Warm-up and Cool-down Performed on first and last piece of equipment   VAD Patient? No   VAD patient   Has back up controller? No   Has spare charged batteries? No   Has battery cables? No   Has compatible battery clips? No   Pain Assessment   Currently in Pain? No/denies   Multiple Pain Sites No         Goals Met:  Independence with exercise equipment Exercise tolerated well Personal goals reviewed No report of cardiac concerns or symptoms Strength training completed today  Goals Unmet:  Not Applicable  Goals Comments:    Dr. Emily Filbert is Medical Director for [place] and [facility].

## 2015-02-02 ENCOUNTER — Ambulatory Visit (HOSPITAL_COMMUNITY): Payer: Self-pay | Admitting: *Deleted

## 2015-02-02 LAB — PROTIME-INR: INR: 2 — AB (ref <=1.1)

## 2015-02-03 DIAGNOSIS — Z95811 Presence of heart assist device: Secondary | ICD-10-CM

## 2015-02-03 DIAGNOSIS — I509 Heart failure, unspecified: Secondary | ICD-10-CM | POA: Diagnosis not present

## 2015-02-03 DIAGNOSIS — Z955 Presence of coronary angioplasty implant and graft: Secondary | ICD-10-CM | POA: Diagnosis not present

## 2015-02-03 DIAGNOSIS — I252 Old myocardial infarction: Secondary | ICD-10-CM | POA: Diagnosis not present

## 2015-02-03 NOTE — Progress Notes (Signed)
Daily Session Note  Patient Details  Name: Scott [name] MRN: [id number] Date of Birth: 27-Dec-1945 Referring Provider:  [name], MD  Encounter Date: 02/03/2015  Check In:     Session Check In - 02/03/15 0907    Check-In   Staff Present Lestine Box BS, ACSM EP-C, Exercise Physiologist;Carroll Enterkin RN, BSN   ER physicians immediately available to respond to emergencies See telemetry face sheet for immediately available ER MD   Medication changes reported     No   Fall or balance concerns reported    No   Warm-up and Cool-down Performed on first and last piece of equipment   VAD Patient? Yes   VAD patient   Has back up controller? Yes   Has spare charged batteries? Yes   Has battery cables? Yes   Has compatible battery clips? Yes   Pain Assessment   Currently in Pain? No/denies         Goals Met:  Proper associated with RPD/PD & O2 Sat Exercise tolerated well Personal goals reviewed No report of cardiac concerns or symptoms Strength training completed today  Goals Unmet:  Not Applicable  Goals Comments:    Dr. Emily Filbert is Medical Director for [place] and [facility].

## 2015-02-07 ENCOUNTER — Ambulatory Visit
Admission: RE | Admit: 2015-02-07 | Discharge: 2015-02-07 | Disposition: A | Discharge status: Home / Self Care | Payer: Medicare Other | Source: Ambulatory Visit | Attending: Internal Medicine | Admitting: Internal Medicine

## 2015-02-07 ENCOUNTER — Encounter: Payer: Self-pay | Admitting: *Deleted

## 2015-02-07 DIAGNOSIS — I2699 Other pulmonary embolism without acute cor pulmonale: Secondary | ICD-10-CM

## 2015-02-07 DIAGNOSIS — I2782 Chronic pulmonary embolism: Secondary | ICD-10-CM | POA: Diagnosis not present

## 2015-02-07 DIAGNOSIS — J984 Other disorders of lung: Secondary | ICD-10-CM | POA: Diagnosis not present

## 2015-02-07 DIAGNOSIS — J90 Pleural effusion, not elsewhere classified: Secondary | ICD-10-CM | POA: Diagnosis not present

## 2015-02-07 MED ORDER — IOHEXOL 350 MG/ML SOLN
100.0000 mL | Freq: Once | INTRAVENOUS | Status: AC | PRN
  Administered 2015-02-07: 100 mL via INTRAVENOUS

## 2015-02-08 ENCOUNTER — Encounter: Payer: Medicare Other | Admitting: *Deleted

## 2015-02-08 DIAGNOSIS — I252 Old myocardial infarction: Secondary | ICD-10-CM | POA: Diagnosis not present

## 2015-02-08 DIAGNOSIS — Z95811 Presence of heart assist device: Secondary | ICD-10-CM

## 2015-02-08 DIAGNOSIS — I509 Heart failure, unspecified: Secondary | ICD-10-CM | POA: Diagnosis not present

## 2015-02-08 DIAGNOSIS — I213 ST elevation (STEMI) myocardial infarction of unspecified site: Secondary | ICD-10-CM

## 2015-02-08 DIAGNOSIS — Z955 Presence of coronary angioplasty implant and graft: Secondary | ICD-10-CM | POA: Diagnosis not present

## 2015-02-08 NOTE — Progress Notes (Signed)
Daily Session Note  Patient Details  Name: Scott [name] MRN: [id number] Date of Birth: [birth year] Referring Provider:  [name], MD  Encounter Date: 02/08/2015  Check In:     Session Check In - 02/08/15 1001    Check-In   Staff Present Diane Joya Gaskins RN, Drusilla Kanner MS, ACSM CEP Exercise Physiologist   ER physicians immediately available to respond to emergencies See telemetry face sheet for immediately available ER MD   Medication changes reported     No   Fall or balance concerns reported    No   Warm-up and Cool-down Performed on first and last piece of equipment   VAD Patient? Yes   VAD patient   Has back up controller? Yes   Has spare charged batteries? Yes   Has battery cables? Yes   Has compatible battery clips? Yes   Pain Assessment   Currently in Pain? No/denies   Multiple Pain Sites No           Exercise Prescription Changes - 02/07/15 1100    Exercise Review   Progression Yes   Response to Exercise   Blood Pressure (Admit) 88/0 mmHg  Doppler   Blood Pressure (Exit) 80/0 mmHg   Heart Rate (Admit) 110 bpm   Heart Rate (Exercise) 124 bpm   Heart Rate (Exit) 102 bpm   Rating of Perceived Exertion (Exercise) 13   Symptoms No   Duration Progress to 30 minutes of continuous aerobic without signs/symptoms of physical distress   Intensity THRR unchanged  REST + 20   Progression Continue progressive overload as per policy without signs/symptoms or physical distress.   Resistance Training   Training Prescription Yes   Weight 3   Reps 10-15   Interval Training   Interval Training Yes   Equipment Recumbant Elliptical  REL= BioStep   Recumbant Bike   Level 4   RPM 40   Minutes 15   NuStep   Level 2   Watts 35   Minutes 20   Recumbant Elliptical   Level 3   Watts 25   Minutes 20      Goals Met:  Independence with exercise equipment Exercise tolerated well Personal goals reviewed No report of cardiac concerns or  symptoms Strength training completed today  Goals Unmet:  Not Applicable  Goals Comments: [name] did not want to go up in workload on the BioStep, he will try to go up for a minute or two amidst his base workload.   Dr. Emily Filbert is Medical Director for [place] and [facility].

## 2015-02-09 ENCOUNTER — Ambulatory Visit (HOSPITAL_COMMUNITY)
Admission: RE | Admit: 2015-02-09 | Discharge: 2015-02-09 | Disposition: A | Discharge status: Home / Self Care | Payer: Medicare Other | Source: Ambulatory Visit | Attending: Internal Medicine | Admitting: Internal Medicine

## 2015-02-09 ENCOUNTER — Encounter (HOSPITAL_COMMUNITY): Payer: Self-pay

## 2015-02-09 ENCOUNTER — Other Ambulatory Visit (HOSPITAL_COMMUNITY): Payer: Self-pay | Admitting: Infectious Diseases

## 2015-02-09 ENCOUNTER — Ambulatory Visit (HOSPITAL_COMMUNITY): Payer: Self-pay | Admitting: Infectious Diseases

## 2015-02-09 DIAGNOSIS — I255 Ischemic cardiomyopathy: Secondary | ICD-10-CM | POA: Diagnosis not present

## 2015-02-09 DIAGNOSIS — Z7901 Long term (current) use of anticoagulants: Secondary | ICD-10-CM

## 2015-02-09 DIAGNOSIS — I5022 Chronic systolic (congestive) heart failure: Secondary | ICD-10-CM | POA: Insufficient documentation

## 2015-02-09 DIAGNOSIS — R0609 Other forms of dyspnea: Secondary | ICD-10-CM

## 2015-02-09 DIAGNOSIS — I2510 Atherosclerotic heart disease of native coronary artery without angina pectoris: Secondary | ICD-10-CM | POA: Insufficient documentation

## 2015-02-09 DIAGNOSIS — Z95811 Presence of heart assist device: Secondary | ICD-10-CM | POA: Diagnosis not present

## 2015-02-09 DIAGNOSIS — J449 Chronic obstructive pulmonary disease, unspecified: Secondary | ICD-10-CM | POA: Diagnosis not present

## 2015-02-09 DIAGNOSIS — Z86711 Personal history of pulmonary embolism: Secondary | ICD-10-CM | POA: Insufficient documentation

## 2015-02-09 DIAGNOSIS — M459 Ankylosing spondylitis of unspecified sites in spine: Secondary | ICD-10-CM | POA: Diagnosis not present

## 2015-02-09 DIAGNOSIS — E785 Hyperlipidemia, unspecified: Secondary | ICD-10-CM | POA: Diagnosis not present

## 2015-02-09 DIAGNOSIS — Z8249 Family history of ischemic heart disease and other diseases of the circulatory system: Secondary | ICD-10-CM | POA: Insufficient documentation

## 2015-02-09 DIAGNOSIS — I10 Essential (primary) hypertension: Secondary | ICD-10-CM | POA: Diagnosis not present

## 2015-02-09 DIAGNOSIS — I252 Old myocardial infarction: Secondary | ICD-10-CM | POA: Insufficient documentation

## 2015-02-09 DIAGNOSIS — Z79899 Other long term (current) drug therapy: Secondary | ICD-10-CM | POA: Diagnosis not present

## 2015-02-09 DIAGNOSIS — Z87891 Personal history of nicotine dependence: Secondary | ICD-10-CM | POA: Insufficient documentation

## 2015-02-09 DIAGNOSIS — Z86718 Personal history of other venous thrombosis and embolism: Secondary | ICD-10-CM | POA: Diagnosis not present

## 2015-02-09 DIAGNOSIS — E119 Type 2 diabetes mellitus without complications: Secondary | ICD-10-CM | POA: Diagnosis not present

## 2015-02-09 LAB — COMPREHENSIVE METABOLIC PANEL
ALT: 29 U/L (ref 17–63)
ANION GAP: 7 (ref 5–15)
AST: 38 U/L (ref 15–41)
Albumin: 3.8 g/dL (ref 3.5–5.0)
Alkaline Phosphatase: 176 U/L — ABNORMAL HIGH (ref 38–126)
BUN: 12 mg/dL (ref 6–20)
CALCIUM: 10 mg/dL (ref 8.9–10.3)
CO2: 28 mmol/L (ref 22–32)
Chloride: 102 mmol/L (ref 101–111)
Creatinine, Ser: 1.07 mg/dL (ref 0.61–1.24)
GFR calc non Af Amer: >60 mL/min (ref >60)
Glucose, Bld: 107 mg/dL — ABNORMAL HIGH (ref 65–99)
POTASSIUM: 4.5 mmol/L (ref 3.5–5.1)
SODIUM: 137 mmol/L (ref 135–145)
TOTAL PROTEIN: 8.6 g/dL — AB (ref 6.5–8.1)
Total Bilirubin: 0.8 mg/dL (ref 0.3–1.2)

## 2015-02-09 LAB — CBC
HCT: 41.4 % (ref 39.0–52.0)
Hemoglobin: 13 g/dL (ref 13.0–17.0)
MCH: 27.7 pg (ref 26.0–34.0)
MCHC: 31.4 g/dL (ref 30.0–36.0)
MCV: 88.3 fL (ref 78.0–100.0)
Platelets: 253 10*3/uL (ref 150–400)
RBC: 4.69 MIL/uL (ref 4.22–5.81)
RDW: 16.9 % — ABNORMAL HIGH (ref 11.5–15.5)
WBC: 8.7 10*3/uL (ref 4.0–10.5)

## 2015-02-09 LAB — PROTIME-INR
INR: 1.82 — ABNORMAL HIGH (ref 0.00–1.49)
PROTHROMBIN TIME: 21 s — AB (ref 11.6–15.2)

## 2015-02-09 LAB — LACTATE DEHYDROGENASE: LDH: 312 U/L — ABNORMAL HIGH (ref 98–192)

## 2015-02-09 LAB — BRAIN NATRIURETIC PEPTIDE: B Natriuretic Peptide: 421.8 pg/mL — ABNORMAL HIGH (ref 0.0–100.0)

## 2015-02-09 NOTE — Patient Instructions (Signed)
1. Drive line looks great. Continue with weekly dressing changes. If any moisture noticed under dressing change it.   2. Let us know about cataract surgery whenever you decide to have it done.   3. Coumadin regimen will now be:   6 mg everyday except 4 mg on Mondays and Fridays  Start this new routine today (wednesday) with 6 mg instead of 4 mg.   4. Looking great. Continue doing the exercises and getting stronger.   5. Follow up with [facility] Clinic in 1 month.

## 2015-02-09 NOTE — Progress Notes (Signed)
VAD CLINIC NOTE Patient ID: Scott [name], male   DOB: 09-21-1945, 69 y.o.   MRN: [id number]  Primary Cardiologist: Dr Haroldine Laws INR : [facility]  HPI: Scott [name] is a 69 y/o with h/o CAD with ankylosing spondylitis, DM2, carotid stenting, severe ischemic CM s/p anterior STEM with VT arrest in 12/15, large PE with pulmonary infarct in 2/15 s/p IVC filter and systolic HF with EF 67%.  He underwent HM II LVAD placement on 09/20/13. He had a prolonged hospital course due to recurrent chest bleeding, severe CO2 retention and RV failure.  Follow-up Presents today for follow-up. He is doing very well. Feeling better and better. Appetite is good. Denies dyspnea. No edema. Weight stable. Still with arthritic pain. He is off midodrine. Doing cardiac rehab at [facility] cardiac rehab. Slightly high MAP today but MAPs from cardiac rehab have all been good 70-80s.   No problems with VAD dressing. No bleeding, fevers, chills, melena, dark urine. Performing self test every day. Wife doing dressing changes.   Labs (7/16): K 4.7, creatinine 1.02, LDH 301, INR 1.97, HCT 38.2  LVAD interrogation reveals:  See LVAD coordinator note above.   SH:  Social History   Social History  . Marital Status: Married    Spouse Name: N/A  . Number of Children: N/A  . Years of Education: N/A   Occupational History  . Not on file.   Social History Main Topics  . Smoking status: Former Smoker -- 0.30 packs/day for 0 years    Types: Cigarettes    Quit date: 06/12/1969  . 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  . Alcohol Use: No  . Drug Use: No  . Sexual Activity: Not Currently   Other Topics Concern  . Not on file   Social History Narrative    FH:  Family History  Problem Relation Age of Onset  . Hypertension Father   . Heart attack Father   . Heart Problems Sister     Past Medical History  Diagnosis Date  . Hyperlipidemia   . Hypertension   .  Diabetes   . Nephrolithiasis   . Ankylosing spondylitis   . CAD (coronary artery disease)     a. stenting of LCx 2013; b. STEMI 06/12/14 s/p PCI to LAD complicated by post cath shock requiring IABP; VT s/p DCCV, EF 20%; c. NSTEMI 06/26/14 treated medically.  . Ischemic cardiomyopathy     a. echo 08/23/2014 EF <20%, dilated CM, mod MR/TR  . Acute on chronic respiratory failure     a. 08/2014 in setting of PE.  . Bilateral pulmonary embolism     a. 08/2014 - started on Coumadin. Retrievable IVC filter placed 08/27/14 due to RV strain and large clot burden.  . Right leg DVT     a. 08/2014.  Marland Kitchen Chronic systolic CHF (congestive heart failure)   . Hypotension   . Hemoptysis     a. 08/2014 possibly due to pulm infarct/PE.  Marland Kitchen Pleural effusion on right 08/2014 - small  . Leukocytosis   . Carotid artery disease     a. s/p stenting.  . Ventricular tachycardia     a. 06/2014 at time of MI, s/p DCCV.  Marland Kitchen Reactive thrombocytosis 09/10/2014  . Leukocytosis 09/10/2014    Current Outpatient Prescriptions  Medication Sig Dispense Refill  . amoxicillin (AMOXIL) 500 MG capsule Take 2,000 mg by mouth once.    Marland Kitchen acetaminophen (TYLENOL)  500 MG tablet Take 500 mg by mouth every 6 (six) hours as needed.    . enoxaparin (LOVENOX) 40 MG/0.4ML injection Inject 0.4 mLs (40 mg total) into the skin daily. (Patient not taking: Reported on 01/12/2015) 10 Syringe 2  . furosemide (LASIX) 20 MG tablet Every other day alternate 20 mg (1 pill) with 40 mg (2 pills) 60 tablet 6  . metaxalone (SKELAXIN) 800 MG tablet Take 1 tablet (800 mg total) by mouth daily as needed for muscle spasms. 60 tablet 6  . pantoprazole (PROTONIX) 40 MG tablet Take 1 tablet (40 mg total) by mouth at bedtime. 30 tablet 6  . potassium chloride SA (K-DUR,KLOR-CON) 20 MEQ tablet Take 1 tablet (20 mEq total) by mouth as directed. Take 1 pill with 20 mg Lasix daily and 2 pills with 40 mg Lasix daily 60 tablet 6  . rosuvastatin (CRESTOR) 10 MG tablet Take 10  mg by mouth daily.    . traZODone (DESYREL) 100 MG tablet Take 1 tablet (100 mg total) by mouth at bedtime. 30 tablet 6  . warfarin (COUMADIN) 4 MG tablet Take 1 tablet (4 mg total) by mouth daily at 6 PM. Take 1 pill every day except 1/2 pill Wednesday and Fridays. Or otherwise prescribed. (Patient taking differently: Take 4 mg by mouth daily at 6 PM. Patient taking one 4 mg tablet every day except Monday, Wed, and Friday.) 30 tablet 6  . warfarin (COUMADIN) 6 MG tablet Take 1 tablet (6 mg total) by mouth daily. Or as directed 45 tablet 6   No current facility-administered medications for this encounter.     Vital signs: BP 101/73 mmHg  Pulse 96  Ht 5\' 8"  (1.727 m)  Wt 191 lb (86.637 kg)  BMI 29.05 kg/m2  SpO2 96%   PHYSICAL EXAM: General:  Well appearing.  No resp difficulty HEENT: normal Neck: limited ROM, JVP 6 cm. Carotids 2+ bilaterally; no bruits. No lymphadenopathy or thryomegaly appreciated. Cor: RR + LVAD hum Lungs: clear  Abdomen: soft, nontender, nondistended. Driveline site clear Extremities: no cyanosis, clubbing, rash. No ankle edema Neuro: alert & orientedx3, cranial nerves grossly intact. Moves all 4 extremities w/o difficulty. Affect pleasant.   ASSESSMENT & PLAN: 1. Chronic systolic HF: Ischemic cardiomyopathy, EF 20% s/p HMII LVAD 09/20/13.  Doing very well post VAD, NYHA class II. He does not appear volume overloaded on exam.  - Continue current Lasix.  Creatinine ok today.  2. CAD s/p Anterior STEMI on 06/12/14 with stenting of LAD: Stable. continue statin 3. VAD: Interrogated personally. Parameters stable.  - Driveline site looks good 4. Ankylosing spondylitis. - off NSAIDS. Skelaxin as needed.  5. Bilateral PE with pulmonary infarct: S/P IVC filter 08/27/2014.  Continue coumadin. Repeat CTA chest done this week without residual PE. Planning to remove IVC filter. 6. COPD/CO2 retention:  Improved. He is off bipap.   7. Anticoagulation:  - Continue coumadin.  Goal INR 1.8-2.2 due to bleeding. Off ASA.    Beth[place]hem Langstaff, Nicholi,MD 02/09/2015

## 2015-02-09 NOTE — Progress Notes (Addendum)
Symptom  Yes  No  Details   Angina        x Activity:   Claudication        x How far:   Syncope        x When: Dizziness with orthostatic changes   Stroke        x   Orthopnea        x How many pillows:  1  PND        x How often:  BiPAP               x How many hrs: pt stopped BiPAP  Pedal edema               x   Abd fullness        x   N&V        x Good appetite   Diaphoresis        x When:   Bleeding        x       Nosebleed x 1  Urine color   Light - medium yellow  SOB         x  Activity: continues as before heart failure  Palpitations         x When:  ICD shock       N/A   Hospitlizaitons                x When/where/why:    ED visit         x When/where/why:  Other MD         x        When/who/why: Dr. Stevenson Clinch  Activity    Cardiac rehab 2 x week   Fluid    2000  Diet    Low sodium   Vital signs: HR:  96 MAP BP:  94 Automatic BP: 101/73 (93)  O2 Sat: 96 % Wt:  191 lbs  Last wt:  187.2  lbs Home wts: 181 - 183 lbs  Ht:  5'8"   LVAD interrogation reveals:  Speed:  9200 Flow:  4.2 Power: 5.1 PI: 6.2 Alarms: None Events: having 1 - 6 PI daily (lowest PI recorded 3.5)  Fixed speed:  9200  Low speed limit: 8600  Primary Controller: Replace back up battery in 25 months. Back up controller:  Replace back up battery in 25 months.   LVAD interrogation negative for power spikes, positive for speed drops/PI events. No alarms recorded on event log and none reported by patient/caregiver. Back up equipment is present. No adjustments were made to prescribed settings. Pt/caregiver deny any alarms or VAD equipment issues.   LVAD equipment check completed and is in good working order. Back-up equipment present. LVAD education done on emergency procedures and precautions and reviewed exit site care.   LVAD exit site:  DL intact and dressing without moisture. Encouraged wife to continue weekly dressings. She will perform dressing change today when they get home.   Wife using  home INR monitor and performing INR checks weekly on patient.   Encounter Details:  Reports that he is feeling well today and has been feeling good aside from a moment recently--after CTA he had a moment of feeling very dizzy, nauseated and spinning. Subsided quickly and no further c/o episodes since. Reports that his SOB has been improving, although always present with Ankylosing Spondylitis.   Activity has increased and is getting stronger. He has no interest in  showering with equipment and is content with sponge baths. Cardiac rehab 2x/week at [facility]. Reports sleep has improved since being off BiPAP and with Trazadone 100 mg.   VAD coordinator reviewed daily log from home for daily temperature, weight, and VAD parameters. Pt is performing daily controller and system monitor self tests along with completing weekly and monthly maintenance for LVAD equipment. Also reviewed BP log with cardiac rehab; often BP is in the 70 - 80s. No medication adjustments for BP d/t the risk of him getting orthostatic.   Dr. Haroldine Laws in to see patient today. No medication changes aside from increasing weekly coumadin regimen by adding an additional day of 6 mg (wednesdays). INR 1.82 today (goal 2 - 2.5). Refer to anticoag note for details. D/W he and his wife option to re-visit option for heart transplant candidacy. They both declined d/t his complicated course post-VAD surgery and are pleased overall with how he is doing currently.   He will be having cavities filled on 02/11/15 of which he is taking the prescribed 2000 mg Amoxicillin and is interested in having cataracts removed sometime in November. Advised to notify team when they do it--should not need any changes to anticoagulation regimen as d/w Dr. Haroldine Laws.   RTC in 1 month or sooner as needed.   Janene Madeira, RN VAD Coordinator   Office: [phone] 24/7 VAD Pager: [phone]

## 2015-02-10 DIAGNOSIS — I509 Heart failure, unspecified: Secondary | ICD-10-CM | POA: Diagnosis not present

## 2015-02-10 DIAGNOSIS — Z955 Presence of coronary angioplasty implant and graft: Secondary | ICD-10-CM | POA: Diagnosis not present

## 2015-02-10 DIAGNOSIS — I252 Old myocardial infarction: Secondary | ICD-10-CM | POA: Diagnosis not present

## 2015-02-10 DIAGNOSIS — Z95811 Presence of heart assist device: Secondary | ICD-10-CM | POA: Diagnosis not present

## 2015-02-10 NOTE — Addendum Note (Signed)
Encounter addended by:  Callas, RN on: 02/10/2015 11:14 AM<BR>     Documentation filed: Notes Section

## 2015-02-10 NOTE — Progress Notes (Signed)
Daily Session Note  Patient Details  Name: Scott [name] MRN: [id number] Date of Birth: 01-01-1946 Referring Provider:  [name], MD  Encounter Date: 02/10/2015  Check In:     Session Check In - 02/10/15 0910    Check-In   Staff Present Lestine Box BS, ACSM EP-C, Exercise Physiologist;Carroll Enterkin RN, BSN;Other   ER physicians immediately available to respond to emergencies See telemetry face sheet for immediately available ER MD   Medication changes reported     No   Fall or balance concerns reported    No   Warm-up and Cool-down Performed on first and last piece of equipment   VAD Patient? Yes   VAD patient   Has back up controller? Yes   Has spare charged batteries? Yes   Has battery cables? Yes   Has compatible battery clips? Yes   Pain Assessment   Currently in Pain? No/denies         Goals Met:  Proper associated with RPD/PD & O2 Sat Exercise tolerated well No report of cardiac concerns or symptoms Strength training completed today  Goals Unmet:  Not Applicable  Goals Comments:    Dr. Emily Filbert is Medical Director for [place] and [facility].

## 2015-02-11 ENCOUNTER — Encounter: Payer: Self-pay | Admitting: *Deleted

## 2015-02-11 DIAGNOSIS — Z9861 Coronary angioplasty status: Secondary | ICD-10-CM

## 2015-02-11 DIAGNOSIS — I509 Heart failure, unspecified: Secondary | ICD-10-CM

## 2015-02-11 DIAGNOSIS — Z95811 Presence of heart assist device: Secondary | ICD-10-CM

## 2015-02-11 DIAGNOSIS — I213 ST elevation (STEMI) myocardial infarction of unspecified site: Secondary | ICD-10-CM

## 2015-02-11 NOTE — Progress Notes (Signed)
Cardiac Individual Treatment Plan  Patient Details  Name: Scott [name] MRN: [id number] Date of Birth: [birth year] Referring Provider:  [name], MD  Initial Encounter Date:    Visit Diagnosis: LVAD (left ventricular assist device) present  Acute ST elevation myocardial infarction (STEMI), unspecified artery  Chronic congestive heart failure, unspecified congestive heart failure type  S/P PTCA (percutaneous transluminal coronary angioplasty)  Patient's Home Medications on Admission:  Current outpatient prescriptions:  .  acetaminophen (TYLENOL) 500 MG tablet, Take 500 mg by mouth every 6 (six) hours as needed., Disp: , Rfl:  .  amoxicillin (AMOXIL) 500 MG capsule, Take 2,000 mg by mouth once., Disp: , Rfl:  .  enoxaparin (LOVENOX) 40 MG/0.4ML injection, Inject 0.4 mLs (40 mg total) into the skin daily. (Patient not taking: Reported on 01/12/2015), Disp: 10 Syringe, Rfl: 2 .  furosemide (LASIX) 20 MG tablet, Every other day alternate 20 mg (1 pill) with 40 mg (2 pills), Disp: 60 tablet, Rfl: 6 .  metaxalone (SKELAXIN) 800 MG tablet, Take 1 tablet (800 mg total) by mouth daily as needed for muscle spasms., Disp: 60 tablet, Rfl: 6 .  pantoprazole (PROTONIX) 40 MG tablet, Take 1 tablet (40 mg total) by mouth at bedtime., Disp: 30 tablet, Rfl: 6 .  potassium chloride SA (K-DUR,KLOR-CON) 20 MEQ tablet, Take 1 tablet (20 mEq total) by mouth as directed. Take 1 pill with 20 mg Lasix daily and 2 pills with 40 mg Lasix daily, Disp: 60 tablet, Rfl: 6 .  rosuvastatin (CRESTOR) 10 MG tablet, Take 10 mg by mouth daily., Disp: , Rfl:  .  traZODone (DESYREL) 100 MG tablet, Take 1 tablet (100 mg total) by mouth at bedtime., Disp: 30 tablet, Rfl: 6 .  warfarin (COUMADIN) 4 MG tablet, Take 1 tablet (4 mg total) by mouth daily at 6 PM. Take 1 pill every day except 1/2 pill Wednesday and Fridays. Or otherwise prescribed. (Patient taking differently: Take 4 mg by mouth daily at 6 PM. Patient  taking one 4 mg tablet every day except Monday, Wed, and Friday.), Disp: 30 tablet, Rfl: 6 .  warfarin (COUMADIN) 6 MG tablet, Take 1 tablet (6 mg total) by mouth daily. Or as directed, Disp: 45 tablet, Rfl: 6  Past Medical History: Past Medical History  Diagnosis Date  . Hyperlipidemia   . Hypertension   . Diabetes   . Nephrolithiasis   . Ankylosing spondylitis   . CAD (coronary artery disease)     a. stenting of LCx 2013; b. STEMI 06/12/14 s/p PCI to LAD complicated by post cath shock requiring IABP; VT s/p DCCV, EF 20%; c. NSTEMI 06/26/14 treated medically.  . Ischemic cardiomyopathy     a. echo 08/23/2014 EF <20%, dilated CM, mod MR/TR  . Acute on chronic respiratory failure     a. 08/2014 in setting of PE.  . Bilateral pulmonary embolism     a. 08/2014 - started on Coumadin. Retrievable IVC filter placed 08/27/14 due to RV strain and large clot burden.  . Right leg DVT     a. 08/2014.  Marland Kitchen Chronic systolic CHF (congestive heart failure)   . Hypotension   . Hemoptysis     a. 08/2014 possibly due to pulm infarct/PE.  Marland Kitchen Pleural effusion on right 08/2014 - small  . Leukocytosis   . Carotid artery disease     a. s/p stenting.  . Ventricular tachycardia     a. 06/2014 at time of MI, s/p DCCV.  Marland Kitchen Reactive thrombocytosis 09/10/2014  .  Leukocytosis 09/10/2014    Tobacco Use: History  Smoking status  . Former Smoker -- 0.30 packs/day for 0 years  . Types: Cigarettes  . Quit date: 06/12/1969  Smokeless tobacco  . Never Used    Comment: pt smoked while in [employer] 2-3 cig x 6 months.    Labs: Recent Review Flowsheet Data    Labs for ITP Cardiac and Pulmonary Rehab Latest Ref Rng 10/12/2014 10/12/2014 10/13/2014 10/14/2014 01/12/2015   PHART 7.350 - 7.450 7.335(L) - - - 7.416   PCO2ART 35.0 - 45.0 mmHg 68.6(HH) - - - 40.4   HCO3 20.0 - 24.0 mEq/L 36.6(H) - - - 25.4(H)   TCO2 0 - 100 mmol/L 39 31 34 - 26.7   O2SAT - 99.0 - 86.9 87.0 96.7       Exercise Target Goals:    Exercise  Program Goal: Individual exercise prescription set with THRR, safety & activity barriers. Participant demonstrates ability to understand and report RPE using BORG scale, to self-measure pulse accurately, and to acknowledge the importance of the exercise prescription.  Exercise Prescription Goal: Starting with aerobic activity 30 plus minutes a day, 3 days per week for initial exercise prescription. Provide home exercise prescription and guidelines that participant acknowledges understanding prior to discharge.  Activity Barriers & Risk Stratification:     Activity Barriers & Risk Stratification - 12/13/14 1405    Activity Barriers & Risk Stratification   Activity Barriers Arthritis;Back Problems;Neck/Spine Problems   Risk Stratification High      6 Minute Walk:     6 Minute Walk      12/13/14 1516       6 Minute Walk   Phase Initial     Distance 735 feet     Walk Time 6 minutes     Resting HR 97 bpm     Max Ex. HR 112 bpm     RPE 11     Symptoms No        Initial Exercise Prescription:     Initial Exercise Prescription - 12/13/14 1500    Date of Initial Exercise Prescription   Date 12/13/14   Treadmill   MPH 1   Grade 0   Minutes 10   Bike   Level 0.4   Minutes 15   Recumbant Bike   Level 2   Watts 20   Minutes 15   NuStep   Level 2   Watts 30   Minutes 15   Arm Ergometer   Level 1   Watts 5   Minutes 10   Arm/Foot Ergometer   Level 1   Watts 8   Minutes 15   Cybex   Level 1   RPM 30   Minutes 15   Recumbant Elliptical   Level 1   Watts 15   Minutes 15   REL-XR   Level 2   Watts 20   Minutes 15   Prescription Details   Frequency (times per week) 3   Duration Progress to 30 minutes of continuous aerobic without signs/symptoms of physical distress   Intensity   THRR REST +  20   Ratings of Perceived Exertion 11-13   Perceived Dyspnea 2-4   Progression Continue progressive overload as per policy without signs/symptoms or physical  distress.   Resistance Training   Training Prescription Yes   Weight 1   Reps 10-12      Exercise Prescription Changes:     Exercise Prescription Changes  12/16/14 0900 12/20/14 1600 01/11/15 0600 01/18/15 0900 02/07/15 1100   Exercise Review   Progression Yes Yes Yes Yes Yes   Response to Exercise   Blood Pressure (Admit)  92/0 mmHg  Doppler 88/0 mmHg  Doppler 88/0 mmHg  Doppler 88/0 mmHg  Doppler   Blood Pressure (Exit)  84/0 mmHg 88/0 mmHg  80/0 mmHg   Heart Rate (Admit)  117 bpm 100 bpm  110 bpm   Heart Rate (Exercise)  113 bpm 117 bpm  124 bpm   Heart Rate (Exit)  108 bpm 107 bpm  102 bpm   Rating of Perceived Exertion (Exercise)  13 12  13    Symptoms  No No No No   Duration  Progress to 30 minutes of continuous aerobic without signs/symptoms of physical distress Progress to 30 minutes of continuous aerobic without signs/symptoms of physical distress Progress to 30 minutes of continuous aerobic without signs/symptoms of physical distress Progress to 30 minutes of continuous aerobic without signs/symptoms of physical distress   Intensity  --  REST + 20 THRR unchanged  REST + 20 THRR unchanged  REST + 20 THRR unchanged  REST + 20   Progression  Continue progressive overload as per policy without signs/symptoms or physical distress. Continue progressive overload as per policy without signs/symptoms or physical distress. Continue progressive overload as per policy without signs/symptoms or physical distress. Continue progressive overload as per policy without signs/symptoms or physical distress.   Resistance Training   Training Prescription  Yes Yes Yes Yes   Weight  2lb 3 3 3    Reps  10-15 10-15 10-15 10-15   Interval Training   Interval Training   No No Yes   Equipment     Recumbant Elliptical  REL= BioStep   Recumbant Bike   Level 3 3 4 4 4    RPM 35 35 40 40 40   Minutes  10 15 15 15    NuStep   Level  2 2 2 2    Watts  35 35 35 35   Minutes  20 20 20 20     Recumbant Elliptical   Level   3 3 3    Watts   20 35 25   Minutes   20 20 20      02/10/15 0900           Exercise Review   Progression Yes       Response to Exercise   Blood Pressure (Admit) 88/0 mmHg  Doppler       Blood Pressure (Exit) 80/0 mmHg       Heart Rate (Admit) 110 bpm       Heart Rate (Exercise) 124 bpm       Heart Rate (Exit) 102 bpm       Rating of Perceived Exertion (Exercise) 13       Symptoms No       Duration Progress to 30 minutes of continuous aerobic without signs/symptoms of physical distress       Intensity THRR unchanged  REST + 20       Progression Continue progressive overload as per policy without signs/symptoms or physical distress.       Resistance Training   Training Prescription Yes       Weight 4       Reps 10-15       Interval Training   Interval Training Yes       Equipment Recumbant Elliptical  REL= BioStep  Recumbant Bike   Level 4       RPM 40       Minutes 15       NuStep   Level 2       Watts 35       Minutes 20       Recumbant Elliptical   Level 3       Watts 25       Minutes 20          Discharge Exercise Prescription (Final Exercise Prescription Changes):     Exercise Prescription Changes - 02/10/15 0900    Exercise Review   Progression Yes   Response to Exercise   Blood Pressure (Admit) 88/0 mmHg  Doppler   Blood Pressure (Exit) 80/0 mmHg   Heart Rate (Admit) 110 bpm   Heart Rate (Exercise) 124 bpm   Heart Rate (Exit) 102 bpm   Rating of Perceived Exertion (Exercise) 13   Symptoms No   Duration Progress to 30 minutes of continuous aerobic without signs/symptoms of physical distress   Intensity THRR unchanged  REST + 20   Progression Continue progressive overload as per policy without signs/symptoms or physical distress.   Resistance Training   Training Prescription Yes   Weight 4   Reps 10-15   Interval Training   Interval Training Yes   Equipment Recumbant Elliptical  REL= BioStep   Recumbant  Bike   Level 4   RPM 40   Minutes 15   NuStep   Level 2   Watts 35   Minutes 20   Recumbant Elliptical   Level 3   Watts 25   Minutes 20      Nutrition:  Target Goals: Understanding of nutrition guidelines, daily intake of sodium 1500mg , cholesterol 200mg , calories 30% from fat and 7% or less from saturated fats, daily to have 5 or more servings of fruits and vegetables.  Biometrics:     Pre Biometrics - 12/13/14 1523    Pre Biometrics   Height 5' 8.5" (1.74 m)   Weight 186 lb 11.2 oz (84.687 kg)   Waist Circumference 40 inches   Hip Circumference 42 inches   Waist to Hip Ratio 0.95 %   BMI (Calculated) 28       Nutrition Therapy Plan and Nutrition Goals:     Nutrition Therapy & Goals - 01/25/15 1359    Personal Nutrition Goals   Comments --  Patient does not wish to meet with dietitian at this time.      Nutrition Discharge: Rate Your Plate Scores:   Nutrition Goals Re-Evaluation:     Nutrition Goals Re-Evaluation      01/20/15 1207           Personal Goal #1 Re-Evaluation   Personal Goal #1 Has meet with dieticians in the past and know what to eat for his Coumadin INR PT level blood work to be ok.        Comments Is eating healthy.          Psychosocial: Target Goals: Acknowledge presence or absence of depression, maximize coping skills, provide positive support system. Participant is able to verbalize types and ability to use techniques and skills needed for reducing stress and depression.  Initial Review & Psychosocial Screening:     Initial Psych Review & Screening - 12/13/14 1418    Initial Review   Current issues with Current Sleep Concerns   Family Dynamics   Good Support System? Yes  Comments Does have counselor with LVAD program.      Barriers   Psychosocial barriers to participate in program There are no identifiable barriers or psychosocial needs.;The patient should benefit from training in stress management and relaxation.    Screening Interventions   Interventions Encouraged to exercise      Quality of Life Scores:     Quality of Life - 12/15/14 1122    Quality of Life Scores   Health/Function Pre 14.73 %   Socioeconomic Pre 23 %   Psych/Spiritual Pre 23.21 %   Family Pre 20.71 %   GLOBAL Pre 18.77 %      PHQ-9:     Recent Review Flowsheet Data    Depression screen [facility] 2/9 12/13/2014   Decreased Interest 2   Down, Depressed, Hopeless 0   PHQ - 2 Score 2   Altered sleeping 2   Tired, decreased energy 3   Change in appetite 0   Feeling bad or failure about yourself  0   Trouble concentrating 0   Moving slowly or fidgety/restless 3   Suicidal thoughts 0   PHQ-9 Score 10   Difficult doing work/chores Very difficult      Psychosocial Evaluation and Intervention:   Psychosocial Re-Evaluation:   Vocational Rehabilitation: Provide vocational rehab assistance to qualifying candidates.   Vocational Rehab Evaluation & Intervention:     Vocational Rehab - 12/13/14 1407    Initial Vocational Rehab Evaluation & Intervention   Assessment shows need for Vocational Rehabilitation No      Education: Education Goals: Education classes will be provided on a weekly basis, covering required topics. Participant will state understanding/return demonstration of topics presented.  Learning Barriers/Preferences:     Learning Barriers/Preferences - 12/13/14 1406    Learning Barriers/Preferences   Learning Barriers Reading   Learning Preferences Video      Education Topics: General Nutrition Guidelines/Fats and Fiber: -Group instruction provided by verbal, written material, models and posters to present the general guidelines for heart healthy nutrition. Gives an explanation and review of dietary fats and fiber.   Controlling Sodium/Reading Food Labels: -Group verbal and written material supporting the discussion of sodium use in heart healthy nutrition. Review and explanation with models,  verbal and written materials for utilization of the food label.   Exercise Physiology & Risk Factors: - Group verbal and written instruction with models to review the exercise physiology of the cardiovascular system and associated critical values. Details cardiovascular disease risk factors and the goals associated with each risk factor.   Aerobic Exercise & Resistance Training: - Gives group verbal and written discussion on the health impact of inactivity. On the components of aerobic and resistive training programs and the benefits of this training and how to safely progress through these programs.   Flexibility, Balance, General Exercise Guidelines: - Provides group verbal and written instruction on the benefits of flexibility and balance training programs. Provides general exercise guidelines with specific guidelines to those with heart or lung disease. Demonstration and skill practice provided.   Stress Management: - Provides group verbal and written instruction about the health risks of elevated stress, cause of high stress, and healthy ways to reduce stress.   Depression: - Provides group verbal and written instruction on the correlation between heart/lung disease and depressed mood, treatment options, and the stigmas associated with seeking treatment.   Anatomy & Physiology of the Heart: - Group verbal and written instruction and models provide basic cardiac anatomy and physiology, with the coronary  electrical and arterial systems. Review of: AMI, Angina, Valve disease, Heart Failure, Cardiac Arrhythmia, Pacemakers, and the ICD.   Cardiac Procedures: - Group verbal and written instruction and models to describe the testing methods done to diagnose heart disease. Reviews the outcomes of the test results. Describes the treatment choices: Medical Management, Angioplasty, or Coronary Bypass Surgery.   Cardiac Medications: - Group verbal and written instruction to review commonly  prescribed medications for heart disease. Reviews the medication, class of the drug, and side effects. Includes the steps to properly store meds and maintain the prescription regimen.   Go Sex-Intimacy & Heart Disease, Get SMART - Goal Setting: - Group verbal and written instruction through game format to discuss heart disease and the return to sexual intimacy. Provides group verbal and written material to discuss and apply goal setting through the application of the S.M.A.R.T. Method.   Other Matters of the Heart: - Provides group verbal, written materials and models to describe Heart Failure, Angina, Valve Disease, and Diabetes in the realm of heart disease. Includes description of the disease process and treatment options available to the cardiac patient.          Cardiac Rehab from 01/13/2015 in [facility] Cardiac Rehab   Date  12/16/14   Educator  CE   Instruction Review Code  2- meets goals/outcomes      Exercise & Equipment Safety: - Individual verbal instruction and demonstration of equipment use and safety with use of the equipment.      Cardiac Rehab from 01/13/2015 in [facility] Cardiac Rehab   Date  12/13/14   Educator  S Orlyn Odonoghue   Instruction Review Code  2- meets goals/outcomes      Infection Prevention: - Provides verbal and written material to individual with discussion of infection control including proper hand washing and proper equipment cleaning during exercise session.      Cardiac Rehab from 01/13/2015 in [facility] Cardiac Rehab   Date  12/13/14   Educator  S Alphonse Asbridge   Instruction Review Code  2- meets goals/outcomes      Falls Prevention: - Provides verbal and written material to individual with discussion of falls prevention and safety.      Cardiac Rehab from 01/13/2015 in [facility] Cardiac Rehab   Date  12/13/14   Educator  S Donzel Romack   Instruction Review Code  2- meets goals/outcomes      Diabetes: - Individual verbal and written instruction to review signs/symptoms of diabetes,  desired ranges of glucose level fasting, after meals and with exercise. Advice that pre and post exercise glucose checks will be done for 3 sessions at entry of program.    Knowledge Questionnaire Score:     Knowledge Questionnaire Score - 12/21/14 1214    Knowledge Questionnaire Score   Pre Score deferred per participant      Personal Goals and Risk Factors at Admission:     Personal Goals and Risk Factors at Admission - 12/13/14 1410    Personal Goals and Risk Factors on Admission    Weight Management No   Increase Aerobic Exercise and Physical Activity Yes   Intervention While in program, learn and follow the exercise prescription taught. Start at a low level workload and increase workload after able to maintain previous level for 30 minutes. Increase time before increasing intensity.   Develop more efficient breathing techniques such as purse lipped breathing and diaphragmatic breathing; and practicing self-pacing with activity Yes   Intervention While in program, learn and utilize the  specific breathing techniques taught to you. Continue to practice and use the techniques as needed.   Diabetes No   Hypertension No   Lipids Yes   Goal Cholesterol controlled with medications as prescribed, with individualized exercise RX and with personalized nutrition plan. Value goals: LDL < 70mg , HDL > 40mg . Participant states understanding of desired cholesterol values and following prescriptions.   Intervention Provide nutrition & aerobic exercise along with prescribed medications to achieve LDL 70mg , HDL >40mg .      Personal Goals and Risk Factors Review:      Goals and Risk Factor Review      01/18/15 1504 01/20/15 1208 01/25/15 1011 02/03/15 0919     Increase Aerobic Exercise and Physical Activity   Goals Progress/Improvement seen  Yes Yes Yes Yes    Comments Progressing with exercise prescription Would prefer not to attend the education sessions but only wants to exercise in  Cardiac Rehab.  Climbing stairs is noticeably easier, walking in and out of class and when running errands is easier. Would still like to increase his walking endurance so he can go to the grocery store.  feels much stronger, balance has improved    Abnormal Lipids   Goal Cholesterol controlled with medications as prescribed, with individualized exercise RX and with personalized nutrition plan. Value goals: LDL < 70mg , HDL > 40mg . Participant states understanding of desired cholesterol values and following prescriptions.       Progress seen towards goals Unknown Yes      Comments no labs to compare       Other Goal   Goals Progress/Improvement seen    Yes Yes    Comments   Learning to breathe properly during execise and during the cool down  enjoys everything about the program       Personal Goals Discharge:     Comments: 30 day review  [place] continues to work with exercise prescription progression

## 2015-02-15 ENCOUNTER — Encounter: Payer: Medicare Other | Admitting: *Deleted

## 2015-02-15 DIAGNOSIS — I213 ST elevation (STEMI) myocardial infarction of unspecified site: Secondary | ICD-10-CM

## 2015-02-15 DIAGNOSIS — Z95811 Presence of heart assist device: Secondary | ICD-10-CM

## 2015-02-15 DIAGNOSIS — I252 Old myocardial infarction: Secondary | ICD-10-CM | POA: Diagnosis not present

## 2015-02-15 DIAGNOSIS — I509 Heart failure, unspecified: Secondary | ICD-10-CM | POA: Diagnosis not present

## 2015-02-15 DIAGNOSIS — Z955 Presence of coronary angioplasty implant and graft: Secondary | ICD-10-CM | POA: Diagnosis not present

## 2015-02-15 LAB — POCT INR: INR: 2.1

## 2015-02-15 NOTE — Progress Notes (Signed)
Daily Session Note  Patient Details  Name: Scott [name] MRN: [id number] Date of Birth: 03/25/1946 Referring Provider:  [name], MD  Encounter Date: 02/15/2015  Check In:     Session Check In - 02/15/15 1119    Check-In   Staff Present Damek Ende Joya Gaskins RN, BSN;Other  Bronwen Betters, Exercise Specialist   ER physicians immediately available to respond to emergencies See telemetry face sheet for immediately available ER MD   Medication changes reported     No   Fall or balance concerns reported    No   Warm-up and Cool-down Performed on first and last piece of equipment   VAD Patient? Yes   VAD patient   Has back up controller? Yes   Has spare charged batteries? Yes   Has battery cables? Yes   Has compatible battery clips? Yes   Pain Assessment   Currently in Pain? No/denies   Pain Score 0-No pain   Multiple Pain Sites No         Goals Met:  Exercise tolerated well No report of cardiac concerns or symptoms Strength training completed today  Goals Unmet:  Not Applicable  Goals Comments:    Dr. Emily Filbert is Medical Director for [facility].

## 2015-02-16 ENCOUNTER — Ambulatory Visit (HOSPITAL_COMMUNITY): Payer: Medicare Other | Admitting: Infectious Diseases

## 2015-02-16 ENCOUNTER — Other Ambulatory Visit (HOSPITAL_COMMUNITY): Payer: Medicare Other

## 2015-02-16 ENCOUNTER — Other Ambulatory Visit: Payer: Self-pay | Admitting: *Deleted

## 2015-02-16 DIAGNOSIS — Z95811 Presence of heart assist device: Secondary | ICD-10-CM

## 2015-02-16 DIAGNOSIS — I509 Heart failure, unspecified: Secondary | ICD-10-CM

## 2015-02-16 DIAGNOSIS — Z5181 Encounter for therapeutic drug level monitoring: Secondary | ICD-10-CM

## 2015-02-16 DIAGNOSIS — I2699 Other pulmonary embolism without acute cor pulmonale: Secondary | ICD-10-CM

## 2015-02-16 NOTE — Progress Notes (Signed)
Cardiac Individual Treatment Plan  Patient Details  Name: Scott [name] MRN: [id number] Date of Birth: [birth year] Referring Provider:  [name], MD  Initial Encounter Date:    Visit Diagnosis: LVAD (left ventricular assist device) present  ST elevation myocardial infarction (STEMI), unspecified artery  Patient's Home Medications on Admission:  Current outpatient prescriptions:  .  acetaminophen (TYLENOL) 500 MG tablet, Take 500 mg by mouth every 6 (six) hours as needed., Disp: , Rfl:  .  amoxicillin (AMOXIL) 500 MG capsule, Take 2,000 mg by mouth once., Disp: , Rfl:  .  enoxaparin (LOVENOX) 40 MG/0.4ML injection, Inject 0.4 mLs (40 mg total) into the skin daily. (Patient not taking: Reported on 01/12/2015), Disp: 10 Syringe, Rfl: 2 .  furosemide (LASIX) 20 MG tablet, Every other day alternate 20 mg (1 pill) with 40 mg (2 pills), Disp: 60 tablet, Rfl: 6 .  metaxalone (SKELAXIN) 800 MG tablet, Take 1 tablet (800 mg total) by mouth daily as needed for muscle spasms., Disp: 60 tablet, Rfl: 6 .  pantoprazole (PROTONIX) 40 MG tablet, Take 1 tablet (40 mg total) by mouth at bedtime., Disp: 30 tablet, Rfl: 6 .  potassium chloride SA (K-DUR,KLOR-CON) 20 MEQ tablet, Take 1 tablet (20 mEq total) by mouth as directed. Take 1 pill with 20 mg Lasix daily and 2 pills with 40 mg Lasix daily, Disp: 60 tablet, Rfl: 6 .  rosuvastatin (CRESTOR) 10 MG tablet, Take 10 mg by mouth daily., Disp: , Rfl:  .  traZODone (DESYREL) 100 MG tablet, Take 1 tablet (100 mg total) by mouth at bedtime., Disp: 30 tablet, Rfl: 6 .  warfarin (COUMADIN) 4 MG tablet, Take 1 tablet (4 mg total) by mouth daily at 6 PM. Take 1 pill every day except 1/2 pill Wednesday and Fridays. Or otherwise prescribed. (Patient taking differently: Take 4 mg by mouth daily at 6 PM. Patient taking one 4 mg tablet every day except Monday, Wed, and Friday.), Disp: 30 tablet, Rfl: 6 .  warfarin (COUMADIN) 6 MG tablet, Take 1 tablet (6 mg  total) by mouth daily. Or as directed, Disp: 45 tablet, Rfl: 6  Past Medical History: Past Medical History  Diagnosis Date  . Hyperlipidemia   . Hypertension   . Diabetes   . Nephrolithiasis   . Ankylosing spondylitis   . CAD (coronary artery disease)     a. stenting of LCx 2013; b. STEMI 06/12/14 s/p PCI to LAD complicated by post cath shock requiring IABP; VT s/p DCCV, EF 20%; c. NSTEMI 06/26/14 treated medically.  . Ischemic cardiomyopathy     a. echo 08/23/2014 EF <20%, dilated CM, mod MR/TR  . Acute on chronic respiratory failure     a. 08/2014 in setting of PE.  . Bilateral pulmonary embolism     a. 08/2014 - started on Coumadin. Retrievable IVC filter placed 08/27/14 due to RV strain and large clot burden.  . Right leg DVT     a. 08/2014.  Marland Kitchen Chronic systolic CHF (congestive heart failure)   . Hypotension   . Hemoptysis     a. 08/2014 possibly due to pulm infarct/PE.  Marland Kitchen Pleural effusion on right 08/2014 - small  . Leukocytosis   . Carotid artery disease     a. s/p stenting.  . Ventricular tachycardia     a. 06/2014 at time of MI, s/p DCCV.  Marland Kitchen Reactive thrombocytosis 09/10/2014  . Leukocytosis 09/10/2014    Tobacco Use: History  Smoking status  . Former Smoker --  0.30 packs/day for 0 years  . Types: Cigarettes  . Quit date: 06/12/1969  Smokeless tobacco  . Never Used    Comment: pt smoked while in [employer] 2-3 cig x 6 months.    Labs: Recent Review Flowsheet Data    Labs for ITP Cardiac and Pulmonary Rehab Latest Ref Rng 10/12/2014 10/12/2014 10/13/2014 10/14/2014 01/12/2015   PHART 7.350 - 7.450 7.335(L) - - - 7.416   PCO2ART 35.0 - 45.0 mmHg 68.6(HH) - - - 40.4   HCO3 20.0 - 24.0 mEq/L 36.6(H) - - - 25.4(H)   TCO2 0 - 100 mmol/L 39 31 34 - 26.7   O2SAT - 99.0 - 86.9 87.0 96.7       Exercise Target Goals:    Exercise Program Goal: Individual exercise prescription set with THRR, safety & activity barriers. Participant demonstrates ability to understand and report  RPE using BORG scale, to self-measure pulse accurately, and to acknowledge the importance of the exercise prescription.  Exercise Prescription Goal: Starting with aerobic activity 30 plus minutes a day, 3 days per week for initial exercise prescription. Provide home exercise prescription and guidelines that participant acknowledges understanding prior to discharge.  Activity Barriers & Risk Stratification:     Activity Barriers & Risk Stratification - 12/13/14 1405    Activity Barriers & Risk Stratification   Activity Barriers Arthritis;Back Problems;Neck/Spine Problems   Risk Stratification High      6 Minute Walk:     6 Minute Walk      12/13/14 1516       6 Minute Walk   Phase Initial     Distance 735 feet     Walk Time 6 minutes     Resting HR 97 bpm     Max Ex. HR 112 bpm     RPE 11     Symptoms No        Initial Exercise Prescription:     Initial Exercise Prescription - 12/13/14 1500    Date of Initial Exercise Prescription   Date 12/13/14   Treadmill   MPH 1   Grade 0   Minutes 10   Bike   Level 0.4   Minutes 15   Recumbant Bike   Level 2   Watts 20   Minutes 15   NuStep   Level 2   Watts 30   Minutes 15   Arm Ergometer   Level 1   Watts 5   Minutes 10   Arm/Foot Ergometer   Level 1   Watts 8   Minutes 15   Cybex   Level 1   RPM 30   Minutes 15   Recumbant Elliptical   Level 1   Watts 15   Minutes 15   REL-XR   Level 2   Watts 20   Minutes 15   Prescription Details   Frequency (times per week) 3   Duration Progress to 30 minutes of continuous aerobic without signs/symptoms of physical distress   Intensity   THRR REST +  20   Ratings of Perceived Exertion 11-13   Perceived Dyspnea 2-4   Progression Continue progressive overload as per policy without signs/symptoms or physical distress.   Resistance Training   Training Prescription Yes   Weight 1   Reps 10-12      Exercise Prescription Changes:     Exercise Prescription  Changes      12/16/14 0900 12/20/14 1600 01/11/15 0600 01/18/15 0900 02/07/15 1100   Exercise Review  Progression Yes Yes Yes Yes Yes   Response to Exercise   Blood Pressure (Admit)  92/0 mmHg  Doppler 88/0 mmHg  Doppler 88/0 mmHg  Doppler 88/0 mmHg  Doppler   Blood Pressure (Exit)  84/0 mmHg 88/0 mmHg  80/0 mmHg   Heart Rate (Admit)  117 bpm 100 bpm  110 bpm   Heart Rate (Exercise)  113 bpm 117 bpm  124 bpm   Heart Rate (Exit)  108 bpm 107 bpm  102 bpm   Rating of Perceived Exertion (Exercise)  13 12  13    Symptoms  No No No No   Duration  Progress to 30 minutes of continuous aerobic without signs/symptoms of physical distress Progress to 30 minutes of continuous aerobic without signs/symptoms of physical distress Progress to 30 minutes of continuous aerobic without signs/symptoms of physical distress Progress to 30 minutes of continuous aerobic without signs/symptoms of physical distress   Intensity  --  REST + 20 THRR unchanged  REST + 20 THRR unchanged  REST + 20 THRR unchanged  REST + 20   Progression  Continue progressive overload as per policy without signs/symptoms or physical distress. Continue progressive overload as per policy without signs/symptoms or physical distress. Continue progressive overload as per policy without signs/symptoms or physical distress. Continue progressive overload as per policy without signs/symptoms or physical distress.   Resistance Training   Training Prescription  Yes Yes Yes Yes   Weight  2lb 3 3 3    Reps  10-15 10-15 10-15 10-15   Interval Training   Interval Training   No No Yes   Equipment     Recumbant Elliptical  REL= BioStep   Recumbant Bike   Level 3 3 4 4 4    RPM 35 35 40 40 40   Minutes  10 15 15 15    NuStep   Level  2 2 2 2    Watts  35 35 35 35   Minutes  20 20 20 20    Recumbant Elliptical   Level   3 3 3    Watts   20 35 25   Minutes   20 20 20      02/10/15 0900           Exercise Review   Progression Yes        Response to Exercise   Blood Pressure (Admit) 88/0 mmHg  Doppler       Blood Pressure (Exit) 80/0 mmHg       Heart Rate (Admit) 110 bpm       Heart Rate (Exercise) 124 bpm       Heart Rate (Exit) 102 bpm       Rating of Perceived Exertion (Exercise) 13       Symptoms No       Duration Progress to 30 minutes of continuous aerobic without signs/symptoms of physical distress       Intensity THRR unchanged  REST + 20       Progression Continue progressive overload as per policy without signs/symptoms or physical distress.       Resistance Training   Training Prescription Yes       Weight 4       Reps 10-15       Interval Training   Interval Training Yes       Equipment Recumbant Elliptical  REL= BioStep       Recumbant Bike   Level 4       RPM 40  Minutes 15       NuStep   Level 2       Watts 35       Minutes 20       Recumbant Elliptical   Level 3       Watts 25       Minutes 20          Discharge Exercise Prescription (Final Exercise Prescription Changes):     Exercise Prescription Changes - 02/10/15 0900    Exercise Review   Progression Yes   Response to Exercise   Blood Pressure (Admit) 88/0 mmHg  Doppler   Blood Pressure (Exit) 80/0 mmHg   Heart Rate (Admit) 110 bpm   Heart Rate (Exercise) 124 bpm   Heart Rate (Exit) 102 bpm   Rating of Perceived Exertion (Exercise) 13   Symptoms No   Duration Progress to 30 minutes of continuous aerobic without signs/symptoms of physical distress   Intensity THRR unchanged  REST + 20   Progression Continue progressive overload as per policy without signs/symptoms or physical distress.   Resistance Training   Training Prescription Yes   Weight 4   Reps 10-15   Interval Training   Interval Training Yes   Equipment Recumbant Elliptical  REL= BioStep   Recumbant Bike   Level 4   RPM 40   Minutes 15   NuStep   Level 2   Watts 35   Minutes 20   Recumbant Elliptical   Level 3   Watts 25   Minutes 20       Nutrition:  Target Goals: Understanding of nutrition guidelines, daily intake of sodium 1500mg , cholesterol 200mg , calories 30% from fat and 7% or less from saturated fats, daily to have 5 or more servings of fruits and vegetables.  Biometrics:     Pre Biometrics - 12/13/14 1523    Pre Biometrics   Height 5' 8.5" (1.74 m)   Weight 186 lb 11.2 oz (84.687 kg)   Waist Circumference 40 inches   Hip Circumference 42 inches   Waist to Hip Ratio 0.95 %   BMI (Calculated) 28       Nutrition Therapy Plan and Nutrition Goals:     Nutrition Therapy & Goals - 01/25/15 1359    Personal Nutrition Goals   Comments --  Patient does not wish to meet with dietitian at this time.      Nutrition Discharge: Rate Your Plate Scores:   Nutrition Goals Re-Evaluation:     Nutrition Goals Re-Evaluation      01/20/15 1207           Personal Goal #1 Re-Evaluation   Personal Goal #1 Has meet with dieticians in the past and know what to eat for his Coumadin INR PT level blood work to be ok.        Comments Is eating healthy.          Psychosocial: Target Goals: Acknowledge presence or absence of depression, maximize coping skills, provide positive support system. Participant is able to verbalize types and ability to use techniques and skills needed for reducing stress and depression.  Initial Review & Psychosocial Screening:     Initial Psych Review & Screening - 12/13/14 1418    Initial Review   Current issues with Current Sleep Concerns   Family Dynamics   Good Support System? Yes   Comments Does have counselor with LVAD program.      Barriers   Psychosocial barriers to participate in  program There are no identifiable barriers or psychosocial needs.;The patient should benefit from training in stress management and relaxation.   Screening Interventions   Interventions Encouraged to exercise      Quality of Life Scores:     Quality of Life - 12/15/14 1122    Quality of  Life Scores   Health/Function Pre 14.73 %   Socioeconomic Pre 23 %   Psych/Spiritual Pre 23.21 %   Family Pre 20.71 %   GLOBAL Pre 18.77 %      PHQ-9:     Recent Review Flowsheet Data    Depression screen [facility] 2/9 12/13/2014   Decreased Interest 2   Down, Depressed, Hopeless 0   PHQ - 2 Score 2   Altered sleeping 2   Tired, decreased energy 3   Change in appetite 0   Feeling bad or failure about yourself  0   Trouble concentrating 0   Moving slowly or fidgety/restless 3   Suicidal thoughts 0   PHQ-9 Score 10   Difficult doing work/chores Very difficult      Psychosocial Evaluation and Intervention:   Psychosocial Re-Evaluation:   Vocational Rehabilitation: Provide vocational rehab assistance to qualifying candidates.   Vocational Rehab Evaluation & Intervention:     Vocational Rehab - 12/13/14 1407    Initial Vocational Rehab Evaluation & Intervention   Assessment shows need for Vocational Rehabilitation No      Education: Education Goals: Education classes will be provided on a weekly basis, covering required topics. Participant will state understanding/return demonstration of topics presented.  Learning Barriers/Preferences:     Learning Barriers/Preferences - 12/13/14 1406    Learning Barriers/Preferences   Learning Barriers Reading   Learning Preferences Video      Education Topics: General Nutrition Guidelines/Fats and Fiber: -Group instruction provided by verbal, written material, models and posters to present the general guidelines for heart healthy nutrition. Gives an explanation and review of dietary fats and fiber.   Controlling Sodium/Reading Food Labels: -Group verbal and written material supporting the discussion of sodium use in heart healthy nutrition. Review and explanation with models, verbal and written materials for utilization of the food label.   Exercise Physiology & Risk Factors: - Group verbal and written instruction with  models to review the exercise physiology of the cardiovascular system and associated critical values. Details cardiovascular disease risk factors and the goals associated with each risk factor.   Aerobic Exercise & Resistance Training: - Gives group verbal and written discussion on the health impact of inactivity. On the components of aerobic and resistive training programs and the benefits of this training and how to safely progress through these programs.   Flexibility, Balance, General Exercise Guidelines: - Provides group verbal and written instruction on the benefits of flexibility and balance training programs. Provides general exercise guidelines with specific guidelines to those with heart or lung disease. Demonstration and skill practice provided.   Stress Management: - Provides group verbal and written instruction about the health risks of elevated stress, cause of high stress, and healthy ways to reduce stress.   Depression: - Provides group verbal and written instruction on the correlation between heart/lung disease and depressed mood, treatment options, and the stigmas associated with seeking treatment.   Anatomy & Physiology of the Heart: - Group verbal and written instruction and models provide basic cardiac anatomy and physiology, with the coronary electrical and arterial systems. Review of: AMI, Angina, Valve disease, Heart Failure, Cardiac Arrhythmia, Pacemakers, and the ICD.  Cardiac Procedures: - Group verbal and written instruction and models to describe the testing methods done to diagnose heart disease. Reviews the outcomes of the test results. Describes the treatment choices: Medical Management, Angioplasty, or Coronary Bypass Surgery.   Cardiac Medications: - Group verbal and written instruction to review commonly prescribed medications for heart disease. Reviews the medication, class of the drug, and side effects. Includes the steps to properly store meds and  maintain the prescription regimen.   Go Sex-Intimacy & Heart Disease, Get SMART - Goal Setting: - Group verbal and written instruction through game format to discuss heart disease and the return to sexual intimacy. Provides group verbal and written material to discuss and apply goal setting through the application of the S.M.A.R.T. Method.   Other Matters of the Heart: - Provides group verbal, written materials and models to describe Heart Failure, Angina, Valve Disease, and Diabetes in the realm of heart disease. Includes description of the disease process and treatment options available to the cardiac patient.          Cardiac Rehab from 01/13/2015 in [facility] Cardiac Re[facility]   Date  12/16/14   Educator  CE   Instruction Review Code  2- meets goals/outcomes      Exercise & Equipment Safety: - Individual verbal instruction and demonstration of equipment use and safety with use of the equipment.      Cardiac Rehab from 01/13/2015 in [facility] Cardiac Rehab   Date  12/13/14   Educator  S Bice   Instruction Review Code  2- meets goals/outcomes      Infection Prevention: - Provides verbal and written material to individual with discussion of infection control including proper hand washing and proper equipment cleaning during exercise session.      Cardiac Rehab from 01/13/2015 in [facility] Cardiac Rehab   Date  12/13/14   Educator  S Bice   Instruction Review Code  2- meets goals/outcomes      Falls Prevention: - Provides verbal and written material to individual with discussion of falls prevention and safety.      Cardiac Rehab from 01/13/2015 in [facility] Cardiac Rehab   Date  12/13/14   Educator  S Bice   Instruction Review Code  2- meets goals/outcomes      Diabetes: - Individual verbal and written instruction to review signs/symptoms of diabetes, desired ranges of glucose level fasting, after meals and with exercise. Advice that pre and post exercise glucose checks will be done for 3 sessions  at entry of program.    Knowledge Questionnaire Score:     Knowledge Questionnaire Score - 12/21/14 1214    Knowledge Questionnaire Score   Pre Score deferred per participant      Personal Goals and Risk Factors at Admission:     Personal Goals and Risk Factors at Admission - 12/13/14 1410    Personal Goals and Risk Factors on Admission    Weight Management No   Increase Aerobic Exercise and Physical Activity Yes   Intervention While in program, learn and follow the exercise prescription taught. Start at a low level workload and increase workload after able to maintain previous level for 30 minutes. Increase time before increasing intensity.   Develop more efficient breathing techniques such as purse lipped breathing and diaphragmatic breathing; and practicing self-pacing with activity Yes   Intervention While in program, learn and utilize the specific breathing techniques taught to you. Continue to practice and use the techniques as needed.   Diabetes No  Hypertension No   Lipids Yes   Goal Cholesterol controlled with medications as prescribed, with individualized exercise RX and with personalized nutrition plan. Value goals: LDL < 70mg , HDL > 40mg . Participant states understanding of desired cholesterol values and following prescriptions.   Intervention Provide nutrition & aerobic exercise along with prescribed medications to achieve LDL 70mg , HDL >40mg .      Personal Goals and Risk Factors Review:      Goals and Risk Factor Review      01/18/15 1504 01/20/15 1208 01/25/15 1011 02/03/15 0919     Increase Aerobic Exercise and Physical Activity   Goals Progress/Improvement seen  Yes Yes Yes Yes    Comments Progressing with exercise prescription Would prefer not to attend the education sessions but only wants to exercise in Cardiac Rehab.  Climbing stairs is noticeably easier, walking in and out of class and when running errands is easier. Would still like to increase his  walking endurance so he can go to the grocery store.  feels much stronger, balance has improved    Abnormal Lipids   Goal Cholesterol controlled with medications as prescribed, with individualized exercise RX and with personalized nutrition plan. Value goals: LDL < 70mg , HDL > 40mg . Participant states understanding of desired cholesterol values and following prescriptions.       Progress seen towards goals Unknown Yes      Comments no labs to compare       Other Goal   Goals Progress/Improvement seen    Yes Yes    Comments   Learning to breathe properly during execise and during the cool down  enjoys everything about the program       Personal Goals Discharge:     Comments:Has LVAD. Scott [name] reports he feels better with CR exercising.

## 2015-02-17 DIAGNOSIS — Z95811 Presence of heart assist device: Secondary | ICD-10-CM | POA: Diagnosis not present

## 2015-02-17 DIAGNOSIS — I509 Heart failure, unspecified: Secondary | ICD-10-CM | POA: Diagnosis not present

## 2015-02-17 DIAGNOSIS — I252 Old myocardial infarction: Secondary | ICD-10-CM | POA: Diagnosis not present

## 2015-02-17 DIAGNOSIS — Z955 Presence of coronary angioplasty implant and graft: Secondary | ICD-10-CM | POA: Diagnosis not present

## 2015-02-17 DIAGNOSIS — I213 ST elevation (STEMI) myocardial infarction of unspecified site: Secondary | ICD-10-CM

## 2015-02-17 NOTE — Progress Notes (Signed)
Daily Session Note  Patient Details  Name: Scott [name] MRN: [id number] Date of Birth: 18-Nov-1945 Referring Provider:  [name], MD  Encounter Date: 02/17/2015  Check In:     Session Check In - 02/17/15 0920    Check-In   Staff Present Lestine Box BS, ACSM EP-C, Exercise Physiologist;Carroll Enterkin RN, BSN;Other   ER physicians immediately available to respond to emergencies See telemetry face sheet for immediately available ER MD   Medication changes reported     No   Fall or balance concerns reported    No   Warm-up and Cool-down Performed on first and last piece of equipment   VAD Patient? Yes   VAD patient   Has back up controller? Yes   Has spare charged batteries? Yes   Has battery cables? Yes   Has compatible battery clips? Yes   Pain Assessment   Currently in Pain? No/denies         Goals Met:  Proper associated with RPD/PD & O2 Sat Exercise tolerated well No report of cardiac concerns or symptoms Strength training completed today  Goals Unmet:  Not Applicable  Goals Comments:    Dr. Emily Filbert is Medical Director for [place] and [facility].

## 2015-02-22 ENCOUNTER — Encounter: Payer: Medicare Other | Admitting: *Deleted

## 2015-02-22 DIAGNOSIS — I252 Old myocardial infarction: Secondary | ICD-10-CM | POA: Diagnosis not present

## 2015-02-22 DIAGNOSIS — I213 ST elevation (STEMI) myocardial infarction of unspecified site: Secondary | ICD-10-CM

## 2015-02-22 DIAGNOSIS — Z95811 Presence of heart assist device: Secondary | ICD-10-CM | POA: Diagnosis not present

## 2015-02-22 DIAGNOSIS — Z955 Presence of coronary angioplasty implant and graft: Secondary | ICD-10-CM | POA: Diagnosis not present

## 2015-02-22 DIAGNOSIS — I509 Heart failure, unspecified: Secondary | ICD-10-CM | POA: Diagnosis not present

## 2015-02-22 NOTE — Progress Notes (Signed)
Daily Session Note  Patient Details  Name: Scott [name] MRN: [id number] Date of Birth: 1945-08-26 Referring Provider:  [name], MD  Encounter Date: 02/22/2015  Check In:     Session Check In - 02/22/15 0941    Check-In   Staff Present Chanese Hartsough Joya Gaskins RN, BSN;Renee Dillard Essex MS, ACSM CEP Exercise Physiologist   ER physicians immediately available to respond to emergencies See telemetry face sheet for immediately available ER MD   Medication changes reported     No   Fall or balance concerns reported    No   Warm-up and Cool-down Performed on first and last piece of equipment   VAD Patient? Yes   VAD patient   Has back up controller? Yes   Has spare charged batteries? Yes   Has battery cables? Yes   Has compatible battery clips? Yes   Pain Assessment   Currently in Pain? No/denies   Pain Score 0-No pain   Multiple Pain Sites No           Exercise Prescription Changes - 02/22/15 0900    Exercise Review   Progression Yes   Response to Exercise   Blood Pressure (Admit) --  Doppler   Blood Pressure (Exit) --   Heart Rate (Admit) --   Heart Rate (Exercise) --   Heart Rate (Exit) --   Rating of Perceived Exertion (Exercise) --   Symptoms --   Duration Progress to 30 minutes of continuous aerobic without signs/symptoms of physical distress   Intensity THRR unchanged  REST + 20   Progression Continue progressive overload as per policy without signs/symptoms or physical distress.   Resistance Training   Training Prescription Yes   Weight 4   Reps 10-15   Interval Training   Interval Training Yes   Equipment Recumbant Elliptical  REL= BioStep   Recumbant Bike   Level 6   RPM 50   Minutes 15   NuStep   Level 2   Watts 35   Minutes 20   Recumbant Elliptical   Level 3   Watts 25   Minutes 20   REL-XR   Level 6   Watts 70   Minutes 15      Goals Met:  Exercise tolerated well Personal goals reviewed No report of cardiac concerns or  symptoms Strength training completed today  Goals Unmet:  Not Applicable  Goals Comments:    Dr. Emily Filbert is Medical Director for [facility].

## 2015-02-23 ENCOUNTER — Ambulatory Visit (HOSPITAL_COMMUNITY): Payer: Medicare Other | Admitting: *Deleted

## 2015-02-23 DIAGNOSIS — Z7901 Long term (current) use of anticoagulants: Secondary | ICD-10-CM | POA: Diagnosis not present

## 2015-02-23 LAB — POCT INR: INR: 1.8

## 2015-02-24 DIAGNOSIS — Z95811 Presence of heart assist device: Secondary | ICD-10-CM | POA: Diagnosis not present

## 2015-02-24 DIAGNOSIS — Z955 Presence of coronary angioplasty implant and graft: Secondary | ICD-10-CM | POA: Diagnosis not present

## 2015-02-24 DIAGNOSIS — I509 Heart failure, unspecified: Secondary | ICD-10-CM | POA: Diagnosis not present

## 2015-02-24 DIAGNOSIS — I252 Old myocardial infarction: Secondary | ICD-10-CM | POA: Diagnosis not present

## 2015-02-24 NOTE — Progress Notes (Signed)
Daily Session Note  Patient Details  Name: FLAVIUS REPSHER MRN: [id number] Date of Birth: 1945-07-27 Referring Provider:  [name], MD  Encounter Date: 02/24/2015  Check In:     Session Check In - 02/24/15 0905    Check-In   Staff Present Lestine Box BS, ACSM EP-C, Exercise Physiologist;Carroll Enterkin RN, BSN;Other   ER physicians immediately available to respond to emergencies See telemetry face sheet for immediately available ER MD   Medication changes reported     No   Fall or balance concerns reported    No   Warm-up and Cool-down Performed on first and last piece of equipment   VAD Patient? Yes   VAD patient   Has back up controller? Yes   Has spare charged batteries? Yes   Has battery cables? Yes   Has compatible battery clips? Yes   Pain Assessment   Currently in Pain? No/denies         Goals Met:  Proper associated with RPD/PD & O2 Sat Exercise tolerated well No report of cardiac concerns or symptoms Strength training completed today  Goals Unmet:  Not Applicable  Goals Comments:    Dr. Emily Filbert is Medical Director for [facility] and [facility].

## 2015-03-01 ENCOUNTER — Encounter: Payer: Medicare Other | Admitting: *Deleted

## 2015-03-01 DIAGNOSIS — I252 Old myocardial infarction: Secondary | ICD-10-CM | POA: Diagnosis not present

## 2015-03-01 DIAGNOSIS — Z95811 Presence of heart assist device: Secondary | ICD-10-CM | POA: Diagnosis not present

## 2015-03-01 DIAGNOSIS — I509 Heart failure, unspecified: Secondary | ICD-10-CM | POA: Diagnosis not present

## 2015-03-01 DIAGNOSIS — Z955 Presence of coronary angioplasty implant and graft: Secondary | ICD-10-CM | POA: Diagnosis not present

## 2015-03-01 DIAGNOSIS — I213 ST elevation (STEMI) myocardial infarction of unspecified site: Secondary | ICD-10-CM

## 2015-03-01 NOTE — Progress Notes (Signed)
Daily Session Note  Patient Details  Name: ABRON NEDDO MRN: [id number] Date of Birth: 01-15-1946 Referring Provider:  [name], MD  Encounter Date: 03/01/2015  Check In:     Session Check In - 03/01/15 0929    Check-In   Staff Present Candiss Norse MS, ACSM CEP Exercise Physiologist;Diane Joya Gaskins RN, BSN;Other   ER physicians immediately available to respond to emergencies See telemetry face sheet for immediately available ER MD   Medication changes reported     No   Fall or balance concerns reported    No   Warm-up and Cool-down Performed on first and last piece of equipment   VAD Patient? Yes   VAD patient   Has back up controller? Yes   Has spare charged batteries? Yes   Has battery cables? Yes   Has compatible battery clips? Yes   Pain Assessment   Currently in Pain? No/denies   Multiple Pain Sites No         Goals Met:  Independence with exercise equipment Exercise tolerated well No report of cardiac concerns or symptoms  Goals Unmet:  Not Applicable  Goals Comments: Left early for an appointment and did not complete strength training. Completed all aerobic exercise training.    Dr. Emily Filbert is Medical Director for [facility].

## 2015-03-02 ENCOUNTER — Ambulatory Visit (HOSPITAL_COMMUNITY): Payer: Medicare Other | Admitting: *Deleted

## 2015-03-02 LAB — POCT INR: INR: 3.3

## 2015-03-03 ENCOUNTER — Encounter: Payer: Medicare Other | Attending: Internal Medicine

## 2015-03-03 DIAGNOSIS — I509 Heart failure, unspecified: Secondary | ICD-10-CM | POA: Diagnosis not present

## 2015-03-03 DIAGNOSIS — Z95811 Presence of heart assist device: Secondary | ICD-10-CM | POA: Diagnosis not present

## 2015-03-03 DIAGNOSIS — I252 Old myocardial infarction: Secondary | ICD-10-CM | POA: Diagnosis not present

## 2015-03-03 DIAGNOSIS — Z955 Presence of coronary angioplasty implant and graft: Secondary | ICD-10-CM | POA: Insufficient documentation

## 2015-03-03 NOTE — Progress Notes (Signed)
Daily Session Note  Patient Details  Name: Scott [name] MRN: [id number] Date of Birth: 04/05/1946 Referring Provider:  Bensimhon, Joni R, MD  Encounter Date: 03/03/2015  Check In:     Session Check In - 03/03/15 0928    Check-In   Staff Present Steven Way BS, ACSM EP-C, Exercise Physiologist;Carroll Enterkin RN, BSN;Other   ER physicians immediately available to respond to emergencies See telemetry face sheet for immediately available ER MD   Medication changes reported     No   Fall or balance concerns reported    No   Warm-up and Cool-down Performed on first and last piece of equipment   VAD Patient? Yes   VAD patient   Has back up controller? Yes   Has spare charged batteries? Yes   Has battery cables? Yes   Has compatible battery clips? Yes   Pain Assessment   Currently in Pain? No/denies         Goals Met:  Proper associated with RPD/PD & O2 Sat Exercise tolerated well No report of cardiac concerns or symptoms Strength training completed today  Goals Unmet:  Not Applicable  Goals Comments:   Dr. Mark Miller is Medical Director for [facility] and [facility]. 

## 2015-03-08 ENCOUNTER — Encounter: Payer: Medicare Other | Admitting: *Deleted

## 2015-03-08 DIAGNOSIS — Z9861 Coronary angioplasty status: Secondary | ICD-10-CM

## 2015-03-08 DIAGNOSIS — I509 Heart failure, unspecified: Secondary | ICD-10-CM | POA: Diagnosis not present

## 2015-03-08 DIAGNOSIS — I213 ST elevation (STEMI) myocardial infarction of unspecified site: Secondary | ICD-10-CM

## 2015-03-08 DIAGNOSIS — Z95811 Presence of heart assist device: Secondary | ICD-10-CM | POA: Diagnosis not present

## 2015-03-08 DIAGNOSIS — Z955 Presence of coronary angioplasty implant and graft: Secondary | ICD-10-CM | POA: Diagnosis not present

## 2015-03-08 DIAGNOSIS — I252 Old myocardial infarction: Secondary | ICD-10-CM | POA: Diagnosis not present

## 2015-03-08 NOTE — Progress Notes (Signed)
Daily Session Note  Patient Details  Name: Scott [name] MRN: [id number] Date of Birth: 1945/12/28 Referring Provider:  [name], MD  Encounter Date: 03/08/2015  Check In:     Session Check In - 03/08/15 0911    Check-In   Staff Present Diane Joya Gaskins RN, BSN;Jeffre Enriques Dillard [place] MS, ACSM CEP Exercise Physiologist;Other   ER physicians immediately available to respond to emergencies See telemetry face sheet for immediately available ER MD   Medication changes reported     No   Fall or balance concerns reported    No   Warm-up and Cool-down Performed on first and last piece of equipment   VAD Patient? Yes   VAD patient   Has back up controller? Yes   Has spare charged batteries? Yes   Has battery cables? Yes   Has compatible battery clips? Yes   Pain Assessment   Currently in Pain? No/denies   Multiple Pain Sites No           Exercise Prescription Changes - 03/08/15 0900    Exercise Review   Progression Yes   Response to Exercise   Blood Pressure (Admit) --  Doppler   Duration Progress to 30 minutes of continuous aerobic without signs/symptoms of physical distress   Intensity THRR unchanged  REST + 20   Progression Continue progressive overload as per policy without signs/symptoms or physical distress.   Resistance Training   Training Prescription Yes   Weight 4   Reps 10-15   Interval Training   Interval Training Yes   Equipment Recumbant Elliptical  REL= BioStep   Recumbant Bike   Level 8   RPM 50   Minutes 15   NuStep   Level 4  BioStep   Watts 40   Minutes 20   Recumbant Elliptical   Level 3   RPM 50   Watts 25   Minutes 20   REL-XR   Level 6   Watts 70   Minutes 15      Goals Met:  Independence with exercise equipment Exercise tolerated well Personal goals reviewed No report of cardiac concerns or symptoms Strength training completed today  Goals Unmet:  Not Applicable  Goals Comments: Reviewed individualized exercise  prescription and made increases per departmental policy. Exercise increases were discussed with the patient and they were able to perform the new work loads without issue (no signs or symptoms).     Dr. Emily Filbert is Medical Director for [facility] and [facility].

## 2015-03-09 ENCOUNTER — Ambulatory Visit (HOSPITAL_COMMUNITY): Payer: Medicare Other | Admitting: *Deleted

## 2015-03-09 LAB — POCT INR: INR: 2.2

## 2015-03-10 DIAGNOSIS — I509 Heart failure, unspecified: Secondary | ICD-10-CM | POA: Diagnosis not present

## 2015-03-10 DIAGNOSIS — Z95811 Presence of heart assist device: Secondary | ICD-10-CM | POA: Diagnosis not present

## 2015-03-10 DIAGNOSIS — Z955 Presence of coronary angioplasty implant and graft: Secondary | ICD-10-CM | POA: Diagnosis not present

## 2015-03-10 DIAGNOSIS — I252 Old myocardial infarction: Secondary | ICD-10-CM | POA: Diagnosis not present

## 2015-03-10 NOTE — Progress Notes (Signed)
Daily Session Note  Patient Details  Name: Scott [name] MRN: [id number] Date of Birth: 02-26-1946 Referring Provider:  [name], MD  Encounter Date: 03/10/2015  Check In:     Session Check In - 03/10/15 0954    Check-In   Staff Present Nyoka Cowden RN;Steven Way BS, ACSM EP-C, Exercise Physiologist;Other   ER physicians immediately available to respond to emergencies See telemetry face sheet for immediately available ER MD   Medication changes reported     No   Fall or balance concerns reported    No   Warm-up and Cool-down Performed on first and last piece of equipment   VAD Patient? Yes   VAD patient   Has back up controller? Yes   Has spare charged batteries? Yes   Has battery cables? Yes   Has compatible battery clips? Yes   Pain Assessment   Currently in Pain? No/denies         Goals Met:  Independence with exercise equipment Exercise tolerated well No report of cardiac concerns or symptoms Strength training completed today  Goals Unmet:  Not Applicable  Goals Comments:    Dr. Emily Filbert is Medical Director for [facility].

## 2015-03-14 ENCOUNTER — Encounter: Payer: Self-pay | Admitting: *Deleted

## 2015-03-14 DIAGNOSIS — I213 ST elevation (STEMI) myocardial infarction of unspecified site: Secondary | ICD-10-CM

## 2015-03-14 DIAGNOSIS — Z95811 Presence of heart assist device: Secondary | ICD-10-CM

## 2015-03-14 DIAGNOSIS — Z9861 Coronary angioplasty status: Secondary | ICD-10-CM

## 2015-03-14 NOTE — Progress Notes (Signed)
Cardiac Individual Treatment Plan  Patient Details  Name: Scott [name] MRN: [id number] Date of Birth: [birth year] Referring Provider:  [name], MD  Initial Encounter Date:    Visit Diagnosis: LVAD (left ventricular assist device) present  ST elevation myocardial infarction (STEMI), unspecified artery  S/P PTCA (percutaneous transluminal coronary angioplasty)  Patient's Home Medications on Admission:  Current outpatient prescriptions:  .  acetaminophen (TYLENOL) 500 MG tablet, Take 500 mg by mouth every 6 (six) hours as needed., Disp: , Rfl:  .  amoxicillin (AMOXIL) 500 MG capsule, Take 2,000 mg by mouth once., Disp: , Rfl:  .  enoxaparin (LOVENOX) 40 MG/0.4ML injection, Inject 0.4 mLs (40 mg total) into the skin daily. (Patient not taking: Reported on 01/12/2015), Disp: 10 Syringe, Rfl: 2 .  furosemide (LASIX) 20 MG tablet, Every other day alternate 20 mg (1 pill) with 40 mg (2 pills), Disp: 60 tablet, Rfl: 6 .  metaxalone (SKELAXIN) 800 MG tablet, Take 1 tablet (800 mg total) by mouth daily as needed for muscle spasms., Disp: 60 tablet, Rfl: 6 .  pantoprazole (PROTONIX) 40 MG tablet, Take 1 tablet (40 mg total) by mouth at bedtime., Disp: 30 tablet, Rfl: 6 .  potassium chloride SA (K-DUR,KLOR-CON) 20 MEQ tablet, Take 1 tablet (20 mEq total) by mouth as directed. Take 1 pill with 20 mg Lasix daily and 2 pills with 40 mg Lasix daily, Disp: 60 tablet, Rfl: 6 .  rosuvastatin (CRESTOR) 10 MG tablet, Take 10 mg by mouth daily., Disp: , Rfl:  .  traZODone (DESYREL) 100 MG tablet, Take 1 tablet (100 mg total) by mouth at bedtime., Disp: 30 tablet, Rfl: 6 .  warfarin (COUMADIN) 4 MG tablet, Take 1 tablet (4 mg total) by mouth daily at 6 PM. Take 1 pill every day except 1/2 pill Wednesday and Fridays. Or otherwise prescribed. (Patient taking differently: Take 4 mg by mouth daily at 6 PM. Patient taking one 4 mg tablet every day except Monday, Wed, and Friday.), Disp: 30 tablet, Rfl:  6 .  warfarin (COUMADIN) 6 MG tablet, Take 1 tablet (6 mg total) by mouth daily. Or as directed, Disp: 45 tablet, Rfl: 6  Past Medical History: Past Medical History  Diagnosis Date  . Hyperlipidemia   . Hypertension   . Diabetes   . Nephrolithiasis   . Ankylosing spondylitis   . CAD (coronary artery disease)     a. stenting of LCx 2013; b. STEMI 06/12/14 s/p PCI to LAD complicated by post cath shock requiring IABP; VT s/p DCCV, EF 20%; c. NSTEMI 06/26/14 treated medically.  . Ischemic cardiomyopathy     a. echo 08/23/2014 EF <20%, dilated CM, mod MR/TR  . Acute on chronic respiratory failure     a. 08/2014 in setting of PE.  . Bilateral pulmonary embolism     a. 08/2014 - started on Coumadin. Retrievable IVC filter placed 08/27/14 due to RV strain and large clot burden.  . Right leg DVT     a. 08/2014.  Marland Kitchen Chronic systolic CHF (congestive heart failure)   . Hypotension   . Hemoptysis     a. 08/2014 possibly due to pulm infarct/PE.  Marland Kitchen Pleural effusion on right 08/2014 - small  . Leukocytosis   . Carotid artery disease     a. s/p stenting.  . Ventricular tachycardia     a. 06/2014 at time of MI, s/p DCCV.  Marland Kitchen Reactive thrombocytosis 09/10/2014  . Leukocytosis 09/10/2014    Tobacco Use: History  Smoking status  . Former Smoker -- 0.30 packs/day for 0 years  . Types: Cigarettes  . Quit date: 06/12/1969  Smokeless tobacco  . Never Used    Comment: pt smoked while in [employer] 2-3 cig x 6 months.    Labs: Recent Review Flowsheet Data    Labs for ITP Cardiac and Pulmonary Rehab Latest Ref Rng 10/12/2014 10/12/2014 10/13/2014 10/14/2014 01/12/2015   PHART 7.350 - 7.450 7.335(L) - - - 7.416   PCO2ART 35.0 - 45.0 mmHg 68.6(HH) - - - 40.4   HCO3 20.0 - 24.0 mEq/L 36.6(H) - - - 25.4(H)   TCO2 0 - 100 mmol/L 39 31 34 - 26.7   O2SAT - 99.0 - 86.9 87.0 96.7       Exercise Target Goals:    Exercise Program Goal: Individual exercise prescription set with THRR, safety & activity barriers.  Participant demonstrates ability to understand and report RPE using BORG scale, to self-measure pulse accurately, and to acknowledge the importance of the exercise prescription.  Exercise Prescription Goal: Starting with aerobic activity 30 plus minutes a day, 3 days per week for initial exercise prescription. Provide home exercise prescription and guidelines that participant acknowledges understanding prior to discharge.  Activity Barriers & Risk Stratification:     Activity Barriers & Risk Stratification - 12/13/14 1405    Activity Barriers & Risk Stratification   Activity Barriers Arthritis;Back Problems;Neck/Spine Problems   Risk Stratification High      6 Minute Walk:     6 Minute Walk      12/13/14 1516       6 Minute Walk   Phase Initial     Distance 735 feet     Walk Time 6 minutes     Resting HR 97 bpm     Max Ex. HR 112 bpm     RPE 11     Symptoms No        Initial Exercise Prescription:     Initial Exercise Prescription - 12/13/14 1500    Date of Initial Exercise Prescription   Date 12/13/14   Treadmill   MPH 1   Grade 0   Minutes 10   Bike   Level 0.4   Minutes 15   Recumbant Bike   Level 2   Watts 20   Minutes 15   NuStep   Level 2   Watts 30   Minutes 15   Arm Ergometer   Level 1   Watts 5   Minutes 10   Arm/Foot Ergometer   Level 1   Watts 8   Minutes 15   Cybex   Level 1   RPM 30   Minutes 15   Recumbant Elliptical   Level 1   Watts 15   Minutes 15   REL-XR   Level 2   Watts 20   Minutes 15   Prescription Details   Frequency (times per week) 3   Duration Progress to 30 minutes of continuous aerobic without signs/symptoms of physical distress   Intensity   THRR REST +  20   Ratings of Perceived Exertion 11-13   Perceived Dyspnea 2-4   Progression Continue progressive overload as per policy without signs/symptoms or physical distress.   Resistance Training   Training Prescription Yes   Weight 1   Reps 10-12       Exercise Prescription Changes:     Exercise Prescription Changes      12/16/14 0900 12/20/14 1600 01/11/15 0600 01/18/15 0900  02/07/15 1100   Exercise Review   Progression _0    Response to Exercise   Blood Pressure (Admit)  92/0 mmHg  Doppler 88/0 mmHg  Doppler 88/0 mmHg  Doppler 88/0 mmHg  Doppler   Blood Pressure (Exit)  84/0 mmHg 88/0 mmHg  80/0 mmHg   Heart Rate (Admit)  117 bpm 100 bpm  110 bpm   Heart Rate (Exercise)  113 bpm 117 bpm  124 bpm   Heart Rate (Exit)  108 bpm 107 bpm  102 bpm   Rating of Perceived Exertion (Exercise)  _1 Symptoms  No No No No   Duration  Progress to 30 minutes of continuous aerobic without signs/symptoms of physical distress Progress to 30 minutes of continuous aerobic without signs/symptoms of physical distress Progress to 30 minutes of continuous aerobic without signs/symptoms of physical distress Progress to 30 minutes of continuous aerobic without signs/symptoms of physical distress   Intensity  --  REST + 20 THRR unchanged  REST + 20 THRR unchanged  REST + 20 THRR unchanged  REST + 20   Progression  Continue progressive overload as per policy without signs/symptoms or physical distress. Continue progressive overload as per policy without signs/symptoms or physical distress. Continue progressive overload as per policy without signs/symptoms or physical distress. Continue progressive overload as per policy without signs/symptoms or physical distress.   Resistance Training   Training Prescription  Yes Yes Yes Yes   Weight  2lb _2 Reps  10-15 10-15 10-15 10-15   Interval Training   Interval Training   No No Yes   Equipment     Recumbant Elliptical  REL= BioStep   Recumbant Bike   Level _3 RPM 35 35 40 40 40   Minutes  _4 NuStep   Level  _5 Watts  35 35 35 35   Minutes  _6 Recumbant Elliptical   Level   _7 Watts   20 35 25   Minutes   _8 02/10/15  0900 02/22/15 0900 03/01/15 1000 03/08/15 0900 03/10/15 1300   Exercise Review   Progression _9    Response to Exercise   Blood Pressure (Admit) 88/0 mmHg  Doppler --  Doppler --  Doppler --  Doppler 74/0 mmHg  Doppler   Blood Pressure (Exit) 80/0 mmHg --   72/0 mmHg   Heart Rate (Admit) 110 bpm --   95 bpm   Heart Rate (Exercise) 124 bpm --   128 bpm   Heart Rate (Exit) 102 bpm --   82 bpm   Rating of Perceived Exertion (Exercise) 13 --   12   Symptoms No --   No   Duration Progress to 30 minutes of continuous aerobic without signs/symptoms of physical distress Progress to 30 minutes of continuous aerobic without signs/symptoms of physical distress Progress to 30 minutes of continuous aerobic without signs/symptoms of physical distress Progress to 30 minutes of continuous aerobic without signs/symptoms of physical distress Progress to 30 minutes of continuous aerobic without signs/symptoms of physical distress   Intensity THRR unchanged  REST + 20 THRR unchanged  REST + 20 THRR unchanged  REST + 20 THRR unchanged  REST + 20 THRR unchanged  REST + 20   Progression Continue progressive  overload as per policy without signs/symptoms or physical distress. Continue progressive overload as per policy without signs/symptoms or physical distress. Continue progressive overload as per policy without signs/symptoms or physical distress. Continue progressive overload as per policy without signs/symptoms or physical distress. Continue progressive overload as per policy without signs/symptoms or physical distress.   Resistance Training   Training Prescription _0    Weight _1 Reps 10-15 10-15 10-15 10-15 10-15   Interval Training   Interval Training _2    Equipment Recumbant Elliptical  REL= BioStep Recumbant Elliptical  REL= BioStep Recumbant Elliptical  REL= BioStep Recumbant Elliptical  REL= BioStep Recumbant Elliptical  REL= BioStep    Recumbant Bike   Level _3 RPM 40 50 50 50 50   Minutes _4 NuStep   Level _5 BioStep 4  BioStep 4  BioStep   Watts 80 35 40 40 40   Minutes _6 Recumbant Elliptical   Level _7 RPM   40 50 50   Watts _8 Minutes _9 REL-XR   Level  _10 Watts  70 70 70 70   Minutes  _11 Discharge Exercise Prescription (Final Exercise Prescription Changes):     Exercise Prescription Changes - 03/10/15 1300    Exercise Review   Progression Yes   Response to Exercise   Blood Pressure (Admit) 74/0 mmHg  Doppler   Blood Pressure (Exit) 72/0 mmHg   Heart Rate (Admit) 95 bpm   Heart Rate (Exercise) 128 bpm   Heart Rate (Exit) 82 bpm   Rating of Perceived Exertion (Exercise) 12   Symptoms No   Duration Progress to 30 minutes of continuous aerobic without signs/symptoms of physical distress   Intensity THRR unchanged  REST + 20   Progression Continue progressive overload as per policy without signs/symptoms or physical distress.   Resistance Training   Training Prescription Yes   Weight 4   Reps 10-15   Interval Training   Interval Training Yes   Equipment Recumbant Elliptical  REL= BioStep   Recumbant Bike   Level 8   RPM 50   Minutes 15   NuStep   Level 4  BioStep   Watts 40   Minutes 20   Recumbant Elliptical   Level 3   RPM 50   Watts 25   Minutes 20   REL-XR   Level 6   Watts 70   Minutes 15      Nutrition:  Target Goals: Understanding of nutrition guidelines, daily intake of sodium <1523m, cholesterol <2018m calories 30% from fat and 7% or less from saturated fats, daily to have 5 or more servings of fruits and vegetables.  Biometrics:     Pre Biometrics - 12/13/14 1523    Pre Biometrics   Height 5' 8.5" (1.74 m)   Weight 186 lb 11.2 oz (84.687 kg)   Waist Circumference 40 inches   Hip Circumference 42 inches   Waist to Hip Ratio 0.95 %   BMI (Calculated) 28        Nutrition Therapy Plan and Nutrition Goals:     Nutrition Therapy & Goals - 01/25/15 1359    Personal  Nutrition Goals   Comments --  Patient does not wish to meet with dietitian at this time.      Nutrition Discharge: Rate Your Plate Scores:   Nutrition Goals Re-Evaluation:     Nutrition Goals Re-Evaluation      01/20/15 1207 02/24/15 0956         Personal Goal #1 Re-Evaluation   Personal Goal #1 Has meet with dieticians in the past and know what to eat for his Coumadin INR PT level blood work to be ok.  "the LVAD people take care of suggesting to me what to eat. I am ok and don't need to  meet individually with your(Cardiac Rehab registered )dietician.      Goal Progress Seen  Yes      Comments Is eating healthy.          Psychosocial: Target Goals: Acknowledge presence or absence of depression, maximize coping skills, provide positive support system. Participant is able to verbalize types and ability to use techniques and skills needed for reducing stress and depression.  Initial Review & Psychosocial Screening:     Initial Psych Review & Screening - 12/13/14 1418    Initial Review   Current issues with Current Sleep Concerns   Family Dynamics   Good Support System? Yes   Comments Does have counselor with LVAD program.      Barriers   Psychosocial barriers to participate in program There are no identifiable barriers or psychosocial needs.;The patient should benefit from training in stress management and relaxation.   Screening Interventions   Interventions Encouraged to exercise      Quality of Life Scores:     Quality of Life - 12/15/14 1122    Quality of Life Scores   Health/Function Pre 14.73 %   Socioeconomic Pre 23 %   Psych/Spiritual Pre 23.21 %   Family Pre 20.71 %   GLOBAL Pre 18.77 %      PHQ-9:     Recent Review Flowsheet Data    Depression screen [facility] 2/9 12/13/2014   Decreased Interest 2   Down, Depressed, Hopeless 0   PHQ - 2  Score 2   Altered sleeping 2   Tired, decreased energy 3   Change in appetite 0   Feeling bad or failure about yourself  0   Trouble concentrating 0   Moving slowly or fidgety/restless 3   Suicidal thoughts 0   PHQ-9 Score 10   Difficult doing work/chores Very difficult      Psychosocial Evaluation and Intervention:     Psychosocial Evaluation - 03/08/15 0934    Psychosocial Evaluation & Interventions   Interventions Encouraged to exercise with the program and follow exercise prescription   Comments Counselor met with Mr. B today for psychosocial evaluation.  He is a 69 year old who had a heart attack 10 months ago resulting in an LVAD.  He has a strong support system and is actively involved in his faith community.  He reports to sleeping well and a having a good appetite.  He denies a history of depression or current symptoms, although he admits to struggling with remaining positive since having the LVAD.  He states he is typically in a positive mood and his primary stress is his health issues.  His goals for this program are to increase his stamina and strength.  He reports exercising consistently both here and at home.  Counselor mentioned the possibility of speaking with a counselor for his occasional depressive symptoms  and he reported "it would be a waste of time."     Continued Psychosocial Services Needed --  Mr. B will benefit from the psychoeducational components of this program, especially on depression and stress management.        Psychosocial Re-Evaluation:   Vocational Rehabilitation: Provide vocational rehab assistance to qualifying candidates.   Vocational Rehab Evaluation & Intervention:     Vocational Rehab - 12/13/14 1407    Initial Vocational Rehab Evaluation & Intervention   Assessment shows need for Vocational Rehabilitation No      Education: Education Goals: Education classes will be provided on a weekly basis, covering required topics. Participant  will state understanding/return demonstration of topics presented.  Learning Barriers/Preferences:     Learning Barriers/Preferences - 12/13/14 1406    Learning Barriers/Preferences   Learning Barriers Reading   Learning Preferences Video      Education Topics: General Nutrition Guidelines/Fats and Fiber: -Group instruction provided by verbal, written material, models and posters to present the general guidelines for heart healthy nutrition. Gives an explanation and review of dietary fats and fiber.   Controlling Sodium/Reading Food Labels: -Group verbal and written material supporting the discussion of sodium use in heart healthy nutrition. Review and explanation with models, verbal and written materials for utilization of the food label.   Exercise Physiology & Risk Factors: - Group verbal and written instruction with models to review the exercise physiology of the cardiovascular system and associated critical values. Details cardiovascular disease risk factors and the goals associated with each risk factor.   Aerobic Exercise & Resistance Training: - Gives group verbal and written discussion on the health impact of inactivity. On the components of aerobic and resistive training programs and the benefits of this training and how to safely progress through these programs.   Flexibility, Balance, General Exercise Guidelines: - Provides group verbal and written instruction on the benefits of flexibility and balance training programs. Provides general exercise guidelines with specific guidelines to those with heart or lung disease. Demonstration and skill practice provided.   Stress Management: - Provides group verbal and written instruction about the health risks of elevated stress, cause of high stress, and healthy ways to reduce stress.   Depression: - Provides group verbal and written instruction on the correlation between heart/lung disease and depressed mood, treatment  options, and the stigmas associated with seeking treatment.   Anatomy & Physiology of the Heart: - Group verbal and written instruction and models provide basic cardiac anatomy and physiology, with the coronary electrical and arterial systems. Review of: AMI, Angina, Valve disease, Heart Failure, Cardiac Arrhythmia, Pacemakers, and the ICD.   Cardiac Procedures: - Group verbal and written instruction and models to describe the testing methods done to diagnose heart disease. Reviews the outcomes of the test results. Describes the treatment choices: Medical Management, Angioplasty, or Coronary Bypass Surgery.   Cardiac Medications: - Group verbal and written instruction to review commonly prescribed medications for heart disease. Reviews the medication, class of the drug, and side effects. Includes the steps to properly store meds and maintain the prescription regimen.   Go Sex-Intimacy & Heart Disease, Get SMART - Goal Setting: - Group verbal and written instruction through game format to discuss heart disease and the return to sexual intimacy. Provides group verbal and written material to discuss and apply goal setting through the application of the S.M.A.R.T. Method.   Other Matters of the Heart: - Provides group verbal, written materials and models to describe Heart Failure, Angina,  Valve Disease, and Diabetes in the realm of heart disease. Includes description of the disease process and treatment options available to the cardiac patient.          Cardiac Rehab from 01/13/2015 in [facility] Cardiac Rehab   Date  12/16/14   Educator  CE   Instruction Review Code  2- meets goals/outcomes      Exercise & Equipment Safety: - Individual verbal instruction and demonstration of equipment use and safety with use of the equipment.      Cardiac Rehab from 01/13/2015 in [facility] Cardiac Rehab   Date  12/13/14   Educator  S Makell Drohan   Instruction Review Code  2- meets goals/outcomes      Infection  Prevention: - Provides verbal and written material to individual with discussion of infection control including proper hand washing and proper equipment cleaning during exercise session.      Cardiac Rehab from 01/13/2015 in [facility] Cardiac Rehab   Date  12/13/14   Educator  S Tyden Kann   Instruction Review Code  2- meets goals/outcomes      Falls Prevention: - Provides verbal and written material to individual with discussion of falls prevention and safety.      Cardiac Rehab from 01/13/2015 in [facility] Cardiac Rehab   Date  12/13/14   Educator  S Grant Swager   Instruction Review Code  2- meets goals/outcomes      Diabetes: - Individual verbal and written instruction to review signs/symptoms of diabetes, desired ranges of glucose level fasting, after meals and with exercise. Advice that pre and post exercise glucose checks will be done for 3 sessions at entry of program.    Knowledge Questionnaire Score:     Knowledge Questionnaire Score - 12/21/14 1214    Knowledge Questionnaire Score   Pre Score deferred per participant      Personal Goals and Risk Factors at Admission:     Personal Goals and Risk Factors at Admission - 12/13/14 1410    Personal Goals and Risk Factors on Admission    Weight Management No   Increase Aerobic Exercise and Physical Activity Yes   Intervention While in program, learn and follow the exercise prescription taught. Start at a low level workload and increase workload after able to maintain previous level for 30 minutes. Increase time before increasing intensity.   Develop more efficient breathing techniques such as purse lipped breathing and diaphragmatic breathing; and practicing self-pacing with activity Yes   Intervention While in program, learn and utilize the specific breathing techniques taught to you. Continue to practice and use the techniques as needed.   Diabetes No   Hypertension No   Lipids Yes   Goal Cholesterol controlled with medications as  prescribed, with individualized exercise RX and with personalized nutrition plan. Value goals: LDL < 67m, HDL > 425m Participant states understanding of desired cholesterol values and following prescriptions.   Intervention Provide nutrition & aerobic exercise along with prescribed medications to achieve LDL <7050mHDL >74m64m    Personal Goals and Risk Factors Review:      Goals and Risk Factor Review      01/18/15 1504 01/20/15 1208 01/25/15 1011 02/03/15 0919 02/16/15 1207   Increase Aerobic Exercise and Physical Activity   Goals Progress/Improvement seen  _0    Comments Progressing with exercise prescription Would prefer not to attend the education sessions but only wants to exercise in Cardiac Rehab.  Climbing stairs is noticeably easier, walking in and  out of class and when running errands is easier. Would still like to increase his walking endurance so he can go to the grocery store.  feels much stronger, balance has improved    Abnormal Lipids   Goal Cholesterol controlled with medications as prescribed, with individualized exercise RX and with personalized nutrition plan. Value goals: LDL < 64m, HDL > 443m Participant states understanding of desired cholesterol values and following prescriptions.       Progress seen towards goals Unknown Yes      Comments no labs to compare       Other Goal   Goals Progress/Improvement seen    Yes Yes    Comments   Learning to breathe properly during execise and during the cool down  enjoys everything about the program      02/24/15 0957 03/14/15 1505         Increase Aerobic Exercise and Physical Activity   Goals Progress/Improvement seen  Yes Yes      Comments "I am trying to live longer especially after I went through so much when they put this LVAD in. I did it for my wife since she said she wasn't ready for me to leave her yet". "I am trying to live longer especially after I went through so much when they put this LVAD in.  I did it for my wife since she said she wasn't ready for me to leave her yet".      Abnormal Lipids   Goal  Cholesterol controlled with medications as prescribed, with individualized exercise RX and with personalized nutrition plan. Value goals: LDL < 7070mHDL > 32m21marticipant states understanding of desired cholesterol values and following prescriptions.      Progress seen towards goals  Yes      Comments  no labs to compare         Personal Goals Discharge:     Comments: 30 day review  Continue with ITP   Progression with exercise is helping DaniQuillian Quinceh ADL's.

## 2015-03-15 ENCOUNTER — Encounter: Payer: Medicare Other | Admitting: *Deleted

## 2015-03-15 DIAGNOSIS — I252 Old myocardial infarction: Secondary | ICD-10-CM | POA: Diagnosis not present

## 2015-03-15 DIAGNOSIS — Z955 Presence of coronary angioplasty implant and graft: Secondary | ICD-10-CM | POA: Diagnosis not present

## 2015-03-15 DIAGNOSIS — I509 Heart failure, unspecified: Secondary | ICD-10-CM | POA: Diagnosis not present

## 2015-03-15 DIAGNOSIS — Z95811 Presence of heart assist device: Secondary | ICD-10-CM | POA: Diagnosis not present

## 2015-03-15 NOTE — Progress Notes (Signed)
Daily Session Note  Patient Details  Name: Scott [name] MRN: [id number] Date of Birth: 08-23-1945 Referring Provider:  [name], MD  Encounter Date: 03/15/2015  Check In:     Session Check In - 03/15/15 1021    Check-In   Staff Present Nyoka Cowden RN;Orvella Digiulio Dillard Essex MS, ACSM CEP Exercise Physiologist;Other   ER physicians immediately available to respond to emergencies See telemetry face sheet for immediately available ER MD   Medication changes reported     No   Fall or balance concerns reported    No   Warm-up and Cool-down Performed on first and last piece of equipment   VAD Patient? Yes   VAD patient   Has back up controller? Yes   Has spare charged batteries? Yes   Has battery cables? Yes   Has compatible battery clips? Yes   Pain Assessment   Currently in Pain? No/denies   Multiple Pain Sites No         Goals Met:  Independence with exercise equipment Exercise tolerated well No report of cardiac concerns or symptoms Strength training completed today  Goals Unmet:  Not Applicable  Goals Comments: [name] did very well with exercise today and was in good spirits.    Dr. Emily Filbert is Medical Director for [facility] and [facility].

## 2015-03-16 ENCOUNTER — Other Ambulatory Visit (HOSPITAL_COMMUNITY): Payer: Self-pay | Admitting: *Deleted

## 2015-03-16 ENCOUNTER — Ambulatory Visit (HOSPITAL_COMMUNITY): Payer: Self-pay | Admitting: *Deleted

## 2015-03-16 ENCOUNTER — Ambulatory Visit (HOSPITAL_COMMUNITY)
Admission: RE | Admit: 2015-03-16 | Discharge: 2015-03-16 | Disposition: A | Discharge status: Home / Self Care | Payer: Medicare Other | Source: Ambulatory Visit | Attending: Internal Medicine | Admitting: Internal Medicine

## 2015-03-16 ENCOUNTER — Other Ambulatory Visit: Payer: Self-pay | Admitting: *Deleted

## 2015-03-16 VITALS — BP 109/94 | HR 104 | Ht 68.0 in | Wt 191.8 lb

## 2015-03-16 DIAGNOSIS — B379 Candidiasis, unspecified: Secondary | ICD-10-CM

## 2015-03-16 DIAGNOSIS — Z95811 Presence of heart assist device: Secondary | ICD-10-CM

## 2015-03-16 DIAGNOSIS — Z86718 Personal history of other venous thrombosis and embolism: Secondary | ICD-10-CM | POA: Diagnosis not present

## 2015-03-16 DIAGNOSIS — M459 Ankylosing spondylitis of unspecified sites in spine: Secondary | ICD-10-CM | POA: Insufficient documentation

## 2015-03-16 DIAGNOSIS — I252 Old myocardial infarction: Secondary | ICD-10-CM | POA: Insufficient documentation

## 2015-03-16 DIAGNOSIS — Z7901 Long term (current) use of anticoagulants: Secondary | ICD-10-CM | POA: Insufficient documentation

## 2015-03-16 DIAGNOSIS — Z79899 Other long term (current) drug therapy: Secondary | ICD-10-CM | POA: Insufficient documentation

## 2015-03-16 DIAGNOSIS — I255 Ischemic cardiomyopathy: Secondary | ICD-10-CM | POA: Diagnosis not present

## 2015-03-16 DIAGNOSIS — I2510 Atherosclerotic heart disease of native coronary artery without angina pectoris: Secondary | ICD-10-CM | POA: Insufficient documentation

## 2015-03-16 DIAGNOSIS — Z8249 Family history of ischemic heart disease and other diseases of the circulatory system: Secondary | ICD-10-CM | POA: Diagnosis not present

## 2015-03-16 DIAGNOSIS — E785 Hyperlipidemia, unspecified: Secondary | ICD-10-CM | POA: Insufficient documentation

## 2015-03-16 DIAGNOSIS — I10 Essential (primary) hypertension: Secondary | ICD-10-CM | POA: Insufficient documentation

## 2015-03-16 DIAGNOSIS — B369 Superficial mycosis, unspecified: Secondary | ICD-10-CM | POA: Diagnosis not present

## 2015-03-16 DIAGNOSIS — E119 Type 2 diabetes mellitus without complications: Secondary | ICD-10-CM | POA: Insufficient documentation

## 2015-03-16 DIAGNOSIS — I5022 Chronic systolic (congestive) heart failure: Secondary | ICD-10-CM | POA: Diagnosis not present

## 2015-03-16 DIAGNOSIS — R0600 Dyspnea, unspecified: Secondary | ICD-10-CM

## 2015-03-16 DIAGNOSIS — Z9861 Coronary angioplasty status: Secondary | ICD-10-CM

## 2015-03-16 DIAGNOSIS — Z87891 Personal history of nicotine dependence: Secondary | ICD-10-CM | POA: Insufficient documentation

## 2015-03-16 DIAGNOSIS — J449 Chronic obstructive pulmonary disease, unspecified: Secondary | ICD-10-CM | POA: Diagnosis not present

## 2015-03-16 DIAGNOSIS — Z86711 Personal history of pulmonary embolism: Secondary | ICD-10-CM | POA: Diagnosis not present

## 2015-03-16 DIAGNOSIS — I2699 Other pulmonary embolism without acute cor pulmonale: Secondary | ICD-10-CM

## 2015-03-16 LAB — CBC
HCT: 40.6 % (ref 39.0–52.0)
Hemoglobin: 13 g/dL (ref 13.0–17.0)
MCH: 27.9 pg (ref 26.0–34.0)
MCHC: 32 g/dL (ref 30.0–36.0)
MCV: 87.1 fL (ref 78.0–100.0)
PLATELETS: 228 10*3/uL (ref 150–400)
RBC: 4.66 MIL/uL (ref 4.22–5.81)
RDW: 17.4 % — AB (ref 11.5–15.5)
WBC: 9.2 10*3/uL (ref 4.0–10.5)

## 2015-03-16 LAB — BASIC METABOLIC PANEL
Anion gap: 6 (ref 5–15)
BUN: 14 mg/dL (ref 6–20)
CO2: 25 mmol/L (ref 22–32)
CREATININE: 1.18 mg/dL (ref 0.61–1.24)
Calcium: 9.8 mg/dL (ref 8.9–10.3)
Chloride: 107 mmol/L (ref 101–111)
GFR calc Af Amer: >60 mL/min (ref >60)
GFR calc non Af Amer: >60 mL/min (ref >60)
GLUCOSE: 146 mg/dL — AB (ref 65–99)
Potassium: 4.3 mmol/L (ref 3.5–5.1)
Sodium: 138 mmol/L (ref 135–145)

## 2015-03-16 LAB — PROTIME-INR
INR: 2.02 — ABNORMAL HIGH (ref 0.00–1.49)
Prothrombin Time: 22.8 seconds — ABNORMAL HIGH (ref 11.6–15.2)

## 2015-03-16 LAB — BRAIN NATRIURETIC PEPTIDE: B Natriuretic Peptide: 472.8 pg/mL — ABNORMAL HIGH (ref 0.0–100.0)

## 2015-03-16 LAB — LACTATE DEHYDROGENASE: LDH: 212 U/L — AB (ref 98–192)

## 2015-03-16 LAB — PREALBUMIN: Prealbumin: 22.2 mg/dL (ref 18–38)

## 2015-03-16 MED ORDER — FLUCONAZOLE 200 MG PO TABS
200.0000 mg | ORAL_TABLET | Freq: Once | ORAL | Status: DC
Start: 2015-03-16 — End: 2015-03-30

## 2015-03-16 MED ORDER — NYSTATIN 100000 UNIT/GM EX POWD: Freq: Two times a day (BID) | CUTANEOUS | Status: DC

## 2015-03-16 NOTE — Patient Instructions (Addendum)
1.  Take Diflucan 200 mg today then 100 mg tomorrow and 100 mg Friday. 2.  Continue using daily dressing kits and change twice weekly or as needed to keep dry. 3.  Use Nystatin powder on reddened areas; avoid exit site.  4.  Page VAD coordinator with blood pressures tomorrow. 5.  Return to [place] clinic in one month.

## 2015-03-16 NOTE — Progress Notes (Signed)
Symptom  Yes  No  Details   Angina        x Activity:   Claudication        x How far:   Syncope        x When:   Stroke        x   Orthopnea        x How many pillows:  1  PND        x How often:  CPAP               N/A How many hrs:  Pedal edema               x   Abd fullness        x   N&V        x Good appetite   Diaphoresis        x When:   Bleeding              x   Urine color   Light yellow  SOB                x Activity:   Palpitations         x When:  ICD shock       N/A   Hospitlizaitons                x When/where/why:    ED visit         x When/where/why:  Other MD                x When/who/why:   Activity    Cardiac rehab 2 x week  Fluid    2 liters  Diet    Low sodium   Vital signs: HR:  104 Doppler MAP:  96 Automatic BP:  109/94 (100) O2 Sat: 95 % Wt:  191.8  lbs  Last wt:  191 lbs Home wts: 182 - 185 lbs  Ht:  5'8"   LVAD interrogation reveals:  Speed:  9200 Flow:  4.4 Power: 5.1 PI:  6.1 Alarms: None Events: having 0 - 5 PI daily (lowest PI recorded 2.3)  Fixed speed:  9200  Low speed limit: 8600  Primary Controller: Replace back up battery in 23 months Back up controller:  Replace back up battery in 23 months   LVAD Exit Site:  VAD dressing removed and site care performed using sterile technique. Drive line exit site cleaned with Chlora prep applicators x 2, allowed to dry, and gauze dressing re-applied. Exit site well healed and incorporated. The velour is fully implanted at exit site. Wife reports areas under tape and attachment device developed "heat bumps" @ 1 week ago after patient mowed grass and attended car show on hot day.  Wife had called VAD coordinator and reported increased sweating with [place]aview dressing and the appearance of "a few heat bumps".  Wife was advised to change to daily dressing kit and place gauze dressing for increased absorbency; change twice weekly or as needed to keep dressing dry. Wife last changed gauze dressing  three days ago (Sunday).  Site with large areas of reddened blisters; no tenderness, drainage, or foul odor noted. Drive line anchor intact. Pt denies fever or chills.      Dr. Haroldine Laws and Amy Ninfa Meeker assessed exit site with following recommendations: Fluconazole 200 mg today then 100 mg x 2 days. Will use Nystatin powder (do not get close to exit site)  twice daily. Continue using guaze dressing kits and change twice weekly or as needed to keep site dry.   Pt/caregiver deny any alarms or VAD equipment issues. VAD coordinator reviewed daily log from home for daily temperature, weight, and VAD parameters. Pt is performing daily controller and system monitor self tests along with completing weekly and monthly maintenance for LVAD equipment.   LVAD equipment check completed and is in good working order. Back-up equipment present. LVAD education done on emergency procedures and precautions and reviewed exit site care.    Patient Instructions: 1.  Take Diflucan 200 mg today then 100 mg tomorrow and 100 mg Friday. 2.  Continue using daily dressing kits and change twice weekly or as needed to keep dry. 3.  Use Nystatin powder on reddened areas; avoid exit site.  4.  Page VAD coordinator with blood pressures tomorrow. 5.  Return to [place] [facility] clinic in one month.

## 2015-03-17 ENCOUNTER — Other Ambulatory Visit (HOSPITAL_COMMUNITY): Payer: Self-pay | Admitting: Infectious Diseases

## 2015-03-17 DIAGNOSIS — Z955 Presence of coronary angioplasty implant and graft: Secondary | ICD-10-CM | POA: Diagnosis not present

## 2015-03-17 DIAGNOSIS — B369 Superficial mycosis, unspecified: Secondary | ICD-10-CM | POA: Insufficient documentation

## 2015-03-17 DIAGNOSIS — Z95811 Presence of heart assist device: Secondary | ICD-10-CM

## 2015-03-17 DIAGNOSIS — I252 Old myocardial infarction: Secondary | ICD-10-CM | POA: Diagnosis not present

## 2015-03-17 DIAGNOSIS — I509 Heart failure, unspecified: Secondary | ICD-10-CM | POA: Diagnosis not present

## 2015-03-17 NOTE — Progress Notes (Signed)
Daily Session Note  Patient Details  Name: PHARAOH PIO MRN: [id number] Date of Birth: [birth year] Referring Provider:  [name], MD  Encounter Date: 03/17/2015  Check In:     Session Check In - 03/17/15 0859    Check-In   Staff Present Lestine Box BS, ACSM EP-C, Exercise Physiologist;Carroll Enterkin RN, BSN;Other   ER physicians immediately available to respond to emergencies See telemetry face sheet for immediately available ER MD   Medication changes reported     No   Fall or balance concerns reported    No   Warm-up and Cool-down Performed on first and last piece of equipment   VAD Patient? Yes   VAD patient   Has back up controller? Yes   Has spare charged batteries? Yes   Has battery cables? Yes   Has compatible battery clips? Yes   Pain Assessment   Currently in Pain? No/denies         Goals Met:  Independence with exercise equipment Exercise tolerated well No report of cardiac concerns or symptoms Strength training completed today  Goals Unmet:  Not Applicable  Goals Comments:    Dr. Emily Filbert is Medical Director for [place] and [facility].

## 2015-03-17 NOTE — Progress Notes (Signed)
VAD CLINIC NOTE  Patient ID: Scott [name], male   DOB: 1945-10-15, 69 y.o.   MRN: [id number]  Primary Cardiologist: Dr Haroldine Laws INR : [facility]  HPI: Scott [name] is a 69 y/o with h/o CAD with ankylosing spondylitis, DM2, carotid stenting, severe ischemic CM s/p anterior STEM with VT arrest in 12/15, large PE with pulmonary infarct in 2/15 s/p IVC filter and systolic HF with EF 29%.  He underwent HM II LVAD placement on 09/20/13. He had a prolonged hospital course due to recurrent chest bleeding, severe CO2 retention and RV failure.  Follow-up Presents today for follow-up. He is doing very well. Feeling better and better. Active though still with stiffness from ankylosing spondylitis.  Appetite is good his wife actually now has him on a diet.. Denies dyspnea. No edema.Marland Kitchen He is off midodrine. Doing cardiac rehab at [facility] cardiac rehab. Slightly high MAP today but MAPs from cardiac rehab have all been good 70-80s.   No problems with VAD dressing but has rash at stie. No bleeding, fevers, chills, melena, dark urine. Performing self test every day. Wife doing dressing changes.   Labs (7/16): K 4.7, creatinine 1.02, LDH 301, INR 1.97, HCT 38.2   LVAD interrogation reveals:  Speed: 9200 Flow: 4.4 Power: 5.1 PI: 6.1 Alarms: None Events: having 0 - 5 PI daily (lowest PI recorded 2.3)  Fixed speed: 9200  Low speed limit: 8600  SH:  Social History   Social History  . Marital Status: Married    Spouse Name: N/A  . Number of Children: N/A  . Years of Education: N/A   Occupational History  . Not on file.   Social History Main Topics  . Smoking status: Former Smoker -- 0.30 packs/day for 0 years    Types: Cigarettes    Quit date: 06/12/1969  . 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  . Alcohol Use: No  . Drug Use: No  . Sexual Activity: Not Currently   Other Topics Concern  . Not on file   Social History Narrative     FH:  Family History  Problem Relation Age of Onset  . Hypertension Father   . Heart attack Father   . Heart Problems Sister     Past Medical History  Diagnosis Date  . Hyperlipidemia   . Hypertension   . Diabetes   . Nephrolithiasis   . Ankylosing spondylitis   . CAD (coronary artery disease)     a. stenting of LCx 2013; b. STEMI 06/12/14 s/p PCI to LAD complicated by post cath shock requiring IABP; VT s/p DCCV, EF 20%; c. NSTEMI 06/26/14 treated medically.  . Ischemic cardiomyopathy     a. echo 08/23/2014 EF <20%, dilated CM, mod MR/TR  . Acute on chronic respiratory failure     a. 08/2014 in setting of PE.  . Bilateral pulmonary embolism     a. 08/2014 - started on Coumadin. Retrievable IVC filter placed 08/27/14 due to RV strain and large clot burden.  . Right leg DVT     a. 08/2014.  Marland Kitchen Chronic systolic CHF (congestive heart failure)   . Hypotension   . Hemoptysis     a. 08/2014 possibly due to pulm infarct/PE.  Marland Kitchen Pleural effusion on right 08/2014 - small  . Leukocytosis   . Carotid artery disease     a. s/p stenting.  . Ventricular tachycardia     a. 06/2014 at time  of MI, s/p DCCV.  Marland Kitchen Reactive thrombocytosis 09/10/2014  . Leukocytosis 09/10/2014    Current Outpatient Prescriptions  Medication Sig Dispense Refill  . acetaminophen (TYLENOL) 500 MG tablet Take 500 mg by mouth every 6 (six) hours as needed.    . furosemide (LASIX) 20 MG tablet Every other day alternate 20 mg (1 pill) with 40 mg (2 pills) 60 tablet 6  . metaxalone (SKELAXIN) 800 MG tablet Take 1 tablet (800 mg total) by mouth daily as needed for muscle spasms. 60 tablet 6  . pantoprazole (PROTONIX) 40 MG tablet Take 1 tablet (40 mg total) by mouth at bedtime. 30 tablet 6  . potassium chloride SA (K-DUR,KLOR-CON) 20 MEQ tablet Take 1 tablet (20 mEq total) by mouth as directed. Take 1 pill with 20 mg Lasix daily and 2 pills with 40 mg Lasix daily 60 tablet 6  . rosuvastatin (CRESTOR) 10 MG tablet Take 10 mg  by mouth daily.    . traZODone (DESYREL) 100 MG tablet Take 1 tablet (100 mg total) by mouth at bedtime. 30 tablet 6  . warfarin (COUMADIN) 4 MG tablet Take 1 tablet (4 mg total) by mouth daily at 6 PM. Take 1 pill every day except 1/2 pill Wednesday and Fridays. Or otherwise prescribed. (Patient taking differently: Take 4 mg by mouth daily at 6 PM. Patient taking one 4 mg tablet every day except Monday, Wed, and Friday.) 30 tablet 6  . warfarin (COUMADIN) 6 MG tablet Take 1 tablet (6 mg total) by mouth daily. Or as directed 45 tablet 6  . fluconazole (DIFLUCAN) 200 MG tablet Take 1 tablet (200 mg total) by mouth once. 5 tablet 0  . nystatin (MYCOSTATIN) powder Apply topically 2 (two) times daily. 15 g 1   No current facility-administered medications for this encounter.     Vital signs: BP 109/94 mmHg  Pulse 104  Ht 5\' 8"  (1.727 m)  Wt 191 lb 12.8 oz (87 kg)  BMI 29.17 kg/m2  SpO2 95%   PHYSICAL EXAM: General:  Well appearing.  No resp difficulty HEENT: normal Neck: limited ROM, JVP 5-6 cm. Carotids 2+ bilaterally; no bruits. No lymphadenopathy or thryomegaly appreciated. Cor: RR + LVAD hum Lungs: clear  Abdomen: soft, nontender, nondistended. Driveline site with diffuse surrounding fungal rash Extremities: no cyanosis, clubbing, rash. No ankle edema Neuro: alert & orientedx3, cranial nerves grossly intact. Moves all 4 extremities w/o difficulty. Affect pleasant.   ASSESSMENT & PLAN: 1. Chronic systolic HF: Ischemic cardiomyopathy, EF 20% s/p HMII LVAD 09/20/13.  Doing very well post VAD, NYHA class II. He does not appear volume overloaded on exam.  - Continue current Lasix.  Creatinine ok today.  2. CAD s/p Anterior STEMI on 06/12/14 with stenting of LAD: Stable. continue statin 3. VAD: Interrogated personally. Parameters stable.  - Driveline site has fungal rash. Evaluated by Darrick Grinder NP-C. We will start fluconazole and nystatin powder. 4. Ankylosing spondylitis. - off NSAIDS.  Skelaxin as needed.  5. Bilateral PE with pulmonary infarct: S/P IVC filter 08/27/2014.  Continue coumadin. Repeat CTA chest done this week without residual PE. Planning to remove IVC filter. Discussed with Dr. Anselm Pancoast in IR. Will arrange for next week.  6. COPD/CO2 retention:  Improved. He is off bipap.   7. Anticoagulation:  - Continue coumadin. Goal INR 1.8-2.2 due to bleeding. Off ASA.  8. Fungal rash - as above.    Bensimhon, Jatavian,MD 03/17/2015

## 2015-03-18 ENCOUNTER — Other Ambulatory Visit (HOSPITAL_COMMUNITY): Payer: Self-pay | Admitting: Infectious Diseases

## 2015-03-18 ENCOUNTER — Telehealth: Payer: Self-pay | Admitting: Infectious Diseases

## 2015-03-18 DIAGNOSIS — Z7901 Long term (current) use of anticoagulants: Secondary | ICD-10-CM

## 2015-03-18 DIAGNOSIS — Z86711 Personal history of pulmonary embolism: Secondary | ICD-10-CM

## 2015-03-18 DIAGNOSIS — I2699 Other pulmonary embolism without acute cor pulmonale: Secondary | ICD-10-CM

## 2015-03-18 NOTE — Telephone Encounter (Signed)
Call received regarding INR results of 2.4 after Fluconazole for 3 days. Reports that the skin around his DL site feels much better and much of the itching has subsided. Cannot do procedure for IR next week, so will have her call next Wednesday when they perform another INR with an update as to how the dressing status is. She will continue to use the Microguard powder around the outer periphery of DL site as previously instructed and painted with skin prep.   Procedure for IVC filter retrieval will be on 03/30/15 @ 0800 in [place]. Informed her that she should expect a phone call from [facility] IR team regarding specifics as to when to report, instructions the night before etc. If she does not hear from them by next Wednesday 03/23/15 will let us know for [place] to call IR to remind them to contact patient.   Janene Madeira, RN VAD Coordinator   Office: [phone] 24/7 VAD Pager: [phone]

## 2015-03-21 ENCOUNTER — Ambulatory Visit (HOSPITAL_COMMUNITY): Payer: Medicare Other | Admitting: *Deleted

## 2015-03-21 DIAGNOSIS — Z5181 Encounter for therapeutic drug level monitoring: Secondary | ICD-10-CM

## 2015-03-21 DIAGNOSIS — Z95811 Presence of heart assist device: Secondary | ICD-10-CM

## 2015-03-21 DIAGNOSIS — I2699 Other pulmonary embolism without acute cor pulmonale: Secondary | ICD-10-CM

## 2015-03-21 LAB — POCT INR: INR: 2.4

## 2015-03-22 DIAGNOSIS — I509 Heart failure, unspecified: Secondary | ICD-10-CM | POA: Diagnosis not present

## 2015-03-22 DIAGNOSIS — Z95811 Presence of heart assist device: Secondary | ICD-10-CM | POA: Diagnosis not present

## 2015-03-22 DIAGNOSIS — Z955 Presence of coronary angioplasty implant and graft: Secondary | ICD-10-CM | POA: Diagnosis not present

## 2015-03-22 DIAGNOSIS — I252 Old myocardial infarction: Secondary | ICD-10-CM | POA: Diagnosis not present

## 2015-03-22 NOTE — Progress Notes (Signed)
Daily Session Note  Patient Details  Name: THADDUS MCDOWELL MRN: [id number] Date of Birth: 10-Feb-1946 Referring Provider:  [name], MD  Encounter Date: 03/22/2015  Check In:     Session Check In - 03/22/15 1005    Check-In   Staff Present Candiss Norse MS, ACSM CEP Exercise Physiologist;Diane Joya Gaskins RN, BSN;Other   ER physicians immediately available to respond to emergencies See telemetry face sheet for immediately available ER MD   Medication changes reported     No   Fall or balance concerns reported    No   Warm-up and Cool-down Performed on first and last piece of equipment   VAD Patient? Yes   VAD patient   Has back up controller? Yes   Has spare charged batteries? Yes   Has battery cables? Yes   Has compatible battery clips? Yes   Pain Assessment   Currently in Pain? No/denies         Goals Met:  Independence with exercise equipment Exercise tolerated well No report of cardiac concerns or symptoms Strength training completed today  Goals Unmet:  Not Applicable  Goals Comments:    Dr. Emily Filbert is Medical Director for [place] and [facility].

## 2015-03-23 ENCOUNTER — Telehealth (HOSPITAL_COMMUNITY): Payer: Self-pay | Admitting: *Deleted

## 2015-03-23 ENCOUNTER — Ambulatory Visit (HOSPITAL_COMMUNITY): Payer: Medicare Other | Admitting: *Deleted

## 2015-03-23 DIAGNOSIS — Z7901 Long term (current) use of anticoagulants: Secondary | ICD-10-CM | POA: Diagnosis not present

## 2015-03-23 LAB — POCT INR: INR: 3.3

## 2015-03-23 NOTE — Telephone Encounter (Signed)
Wife called to report LVAD driveline dressing site has improved since last visit. States she has been using nystatin powder and changing gauze dressing every other day.  States blister have cleared up and skin is now "peeling"; no drainage or moisture noted.  Informed her to try next dressing change without powder and monitor site, if redness increases, then return to using powder with dressing changes.    Gave her IR scheduler contact information to call with questions about next week's procedure on 03/30/15 for IVC filter removal.   Pt verbalized understanding of above.

## 2015-03-24 DIAGNOSIS — I509 Heart failure, unspecified: Secondary | ICD-10-CM

## 2015-03-24 DIAGNOSIS — I252 Old myocardial infarction: Secondary | ICD-10-CM | POA: Diagnosis not present

## 2015-03-24 DIAGNOSIS — Z955 Presence of coronary angioplasty implant and graft: Secondary | ICD-10-CM | POA: Diagnosis not present

## 2015-03-24 DIAGNOSIS — Z95811 Presence of heart assist device: Secondary | ICD-10-CM

## 2015-03-24 NOTE — Progress Notes (Signed)
Daily Session Note  Patient Details  Name: AKARI CRYSLER MRN: [id number] Date of Birth: 12/22/1945 Referring Provider:  [name], MD  Encounter Date: 03/24/2015  Check In:     Session Check In - 03/24/15 0939    Check-In   Staff Present Lestine Box BS, ACSM EP-C, Exercise Physiologist;Carroll Enterkin RN, BSN;Other   ER physicians immediately available to respond to emergencies See telemetry face sheet for immediately available ER MD   Medication changes reported     No   Fall or balance concerns reported    No   Warm-up and Cool-down Performed on first and last piece of equipment   VAD Patient? Yes   VAD patient   Has back up controller? Yes   Has spare charged batteries? Yes   Has battery cables? Yes   Has compatible battery clips? Yes   Pain Assessment   Currently in Pain? No/denies         Goals Met:  Independence with exercise equipment Exercise tolerated well No report of cardiac concerns or symptoms Strength training completed today  Goals Unmet:  Not Applicable  Goals Comments:    Dr. Emily Filbert is Medical Director for [place] and [facility].

## 2015-03-24 NOTE — Progress Notes (Signed)
Cardiac Individual Treatment Plan  Patient Details  Name: Scott [name] MRN: [id number] Date of Birth: September 02, 1945 Referring Provider:  [name], MD  Initial Encounter Date:    Visit Diagnosis: No diagnosis found.  Patient's Home Medications on Admission:  Current outpatient prescriptions:  .  acetaminophen (TYLENOL) 500 MG tablet, Take 500 mg by mouth every 6 (six) hours as needed., Disp: , Rfl:  .  fluconazole (DIFLUCAN) 200 MG tablet, Take 1 tablet (200 mg total) by mouth once., Disp: 5 tablet, Rfl: 0 .  furosemide (LASIX) 20 MG tablet, Every other day alternate 20 mg (1 pill) with 40 mg (2 pills), Disp: 60 tablet, Rfl: 6 .  metaxalone (SKELAXIN) 800 MG tablet, Take 1 tablet (800 mg total) by mouth daily as needed for muscle spasms., Disp: 60 tablet, Rfl: 6 .  nystatin (MYCOSTATIN) powder, Apply topically 2 (two) times daily., Disp: 15 g, Rfl: 1 .  pantoprazole (PROTONIX) 40 MG tablet, Take 1 tablet (40 mg total) by mouth at bedtime., Disp: 30 tablet, Rfl: 6 .  potassium chloride SA (K-DUR,KLOR-CON) 20 MEQ tablet, Take 1 tablet (20 mEq total) by mouth as directed. Take 1 pill with 20 mg Lasix daily and 2 pills with 40 mg Lasix daily, Disp: 60 tablet, Rfl: 6 .  rosuvastatin (CRESTOR) 10 MG tablet, Take 10 mg by mouth daily., Disp: , Rfl:  .  traZODone (DESYREL) 100 MG tablet, Take 1 tablet (100 mg total) by mouth at bedtime., Disp: 30 tablet, Rfl: 6 .  warfarin (COUMADIN) 4 MG tablet, Take 1 tablet (4 mg total) by mouth daily at 6 PM. Take 1 pill every day except 1/2 pill Wednesday and Fridays. Or otherwise prescribed. (Patient taking differently: Take 4 mg by mouth daily at 6 PM. Patient taking one 4 mg tablet every day except Monday, Wed, and Friday.), Disp: 30 tablet, Rfl: 6 .  warfarin (COUMADIN) 6 MG tablet, Take 1 tablet (6 mg total) by mouth daily. Or as directed, Disp: 45 tablet, Rfl: 6  Past Medical History: Past Medical History  Diagnosis Date  . Hyperlipidemia    . Hypertension   . Diabetes   . Nephrolithiasis   . Ankylosing spondylitis   . CAD (coronary artery disease)     a. stenting of LCx 2013; b. STEMI 06/12/14 s/p PCI to LAD complicated by post cath shock requiring IABP; VT s/p DCCV, EF 20%; c. NSTEMI 06/26/14 treated medically.  . Ischemic cardiomyopathy     a. echo 08/23/2014 EF <20%, dilated CM, mod MR/TR  . Acute on chronic respiratory failure     a. 08/2014 in setting of PE.  . Bilateral pulmonary embolism     a. 08/2014 - started on Coumadin. Retrievable IVC filter placed 08/27/14 due to RV strain and large clot burden.  . Right leg DVT     a. 08/2014.  Marland Kitchen Chronic systolic CHF (congestive heart failure)   . Hypotension   . Hemoptysis     a. 08/2014 possibly due to pulm infarct/PE.  Marland Kitchen Pleural effusion on right 08/2014 - small  . Leukocytosis   . Carotid artery disease     a. s/p stenting.  . Ventricular tachycardia     a. 06/2014 at time of MI, s/p DCCV.  Marland Kitchen Reactive thrombocytosis 09/10/2014  . Leukocytosis 09/10/2014    Tobacco Use: History  Smoking status  . Former Smoker -- 0.30 packs/day for 0 years  . Types: Cigarettes  . Quit date: 06/12/1969  Smokeless tobacco  .  Never Used    Comment: pt smoked while in [employer] 2-3 cig x 6 months.    Labs: Recent Review Flowsheet Data    Labs for ITP Cardiac and Pulmonary Rehab Latest Ref Rng 10/12/2014 10/12/2014 10/13/2014 10/14/2014 01/12/2015   PHART 7.350 - 7.450 7.335(L) - - - 7.416   PCO2ART 35.0 - 45.0 mmHg 68.6(HH) - - - 40.4   HCO3 20.0 - 24.0 mEq/L 36.6(H) - - - 25.4(H)   TCO2 0 - 100 mmol/L 39 31 34 - 26.7   O2SAT - 99.0 - 86.9 87.0 96.7       Exercise Target Goals:    Exercise Program Goal: Individual exercise prescription set with THRR, safety & activity barriers. Participant demonstrates ability to understand and report RPE using BORG scale, to self-measure pulse accurately, and to acknowledge the importance of the exercise prescription.  Exercise Prescription  Goal: Starting with aerobic activity 30 plus minutes a day, 3 days per week for initial exercise prescription. Provide home exercise prescription and guidelines that participant acknowledges understanding prior to discharge.  Activity Barriers & Risk Stratification:     Activity Barriers & Risk Stratification - 12/13/14 1405    Activity Barriers & Risk Stratification   Activity Barriers Arthritis;Back Problems;Neck/Spine Problems   Risk Stratification High      6 Minute Walk:     6 Minute Walk      12/13/14 1516       6 Minute Walk   Phase Initial     Distance 735 feet     Walk Time 6 minutes     Resting HR 97 bpm     Max Ex. HR 112 bpm     RPE 11     Symptoms No        Initial Exercise Prescription:     Initial Exercise Prescription - 12/13/14 1500    Date of Initial Exercise Prescription   Date 12/13/14   Treadmill   MPH 1   Grade 0   Minutes 10   Bike   Level 0.4   Minutes 15   Recumbant Bike   Level 2   Watts 20   Minutes 15   NuStep   Level 2   Watts 30   Minutes 15   Arm Ergometer   Level 1   Watts 5   Minutes 10   Arm/Foot Ergometer   Level 1   Watts 8   Minutes 15   Cybex   Level 1   RPM 30   Minutes 15   Recumbant Elliptical   Level 1   Watts 15   Minutes 15   REL-XR   Level 2   Watts 20   Minutes 15   Prescription Details   Frequency (times per week) 3   Duration Progress to 30 minutes of continuous aerobic without signs/symptoms of physical distress   Intensity   THRR REST +  20   Ratings of Perceived Exertion 11-13   Perceived Dyspnea 2-4   Progression Continue progressive overload as per policy without signs/symptoms or physical distress.   Resistance Training   Training Prescription Yes   Weight 1   Reps 10-12      Exercise Prescription Changes:     Exercise Prescription Changes      12/16/14 0900 12/20/14 1600 01/11/15 0600 01/18/15 0900 02/07/15 1100   Exercise Review   Progression Yes Yes Yes Yes Yes    Response to Exercise   Blood Pressure (Admit)  92/0  mmHg  Doppler 88/0 mmHg  Doppler 88/0 mmHg  Doppler 88/0 mmHg  Doppler   Blood Pressure (Exit)  84/0 mmHg 88/0 mmHg  80/0 mmHg   Heart Rate (Admit)  117 bpm 100 bpm  110 bpm   Heart Rate (Exercise)  113 bpm 117 bpm  124 bpm   Heart Rate (Exit)  108 bpm 107 bpm  102 bpm   Rating of Perceived Exertion (Exercise)  13 12  13    Symptoms  No No No No   Duration  Progress to 30 minutes of continuous aerobic without signs/symptoms of physical distress Progress to 30 minutes of continuous aerobic without signs/symptoms of physical distress Progress to 30 minutes of continuous aerobic without signs/symptoms of physical distress Progress to 30 minutes of continuous aerobic without signs/symptoms of physical distress   Intensity  --  REST + 20 THRR unchanged  REST + 20 THRR unchanged  REST + 20 THRR unchanged  REST + 20   Progression  Continue progressive overload as per policy without signs/symptoms or physical distress. Continue progressive overload as per policy without signs/symptoms or physical distress. Continue progressive overload as per policy without signs/symptoms or physical distress. Continue progressive overload as per policy without signs/symptoms or physical distress.   Resistance Training   Training Prescription  Yes Yes Yes Yes   Weight  2lb 3 3 3    Reps  10-15 10-15 10-15 10-15   Interval Training   Interval Training   No No Yes   Equipment     Recumbant Elliptical  REL= BioStep   Recumbant Bike   Level 3 3 4 4 4    RPM 35 35 40 40 40   Minutes  10 15 15 15    NuStep   Level  2 2 2 2    Watts  35 35 35 35   Minutes  20 20 20 20    Recumbant Elliptical   Level   3 3 3    Watts   20 35 25   Minutes   20 20 20      02/10/15 0900 02/22/15 0900 03/01/15 1000 03/08/15 0900 03/10/15 1300   Exercise Review   Progression Yes Yes Yes Yes Yes   Response to Exercise   Blood Pressure (Admit) 88/0 mmHg  Doppler --  Doppler --   Doppler --  Doppler 74/0 mmHg  Doppler   Blood Pressure (Exit) 80/0 mmHg --   72/0 mmHg   Heart Rate (Admit) 110 bpm --   95 bpm   Heart Rate (Exercise) 124 bpm --   128 bpm   Heart Rate (Exit) 102 bpm --   82 bpm   Rating of Perceived Exertion (Exercise) 13 --   12   Symptoms No --   No   Duration Progress to 30 minutes of continuous aerobic without signs/symptoms of physical distress Progress to 30 minutes of continuous aerobic without signs/symptoms of physical distress Progress to 30 minutes of continuous aerobic without signs/symptoms of physical distress Progress to 30 minutes of continuous aerobic without signs/symptoms of physical distress Progress to 30 minutes of continuous aerobic without signs/symptoms of physical distress   Intensity THRR unchanged  REST + 20 THRR unchanged  REST + 20 THRR unchanged  REST + 20 THRR unchanged  REST + 20 THRR unchanged  REST + 20   Progression Continue progressive overload as per policy without signs/symptoms or physical distress. Continue progressive overload as per policy without signs/symptoms or physical distress. Continue progressive overload as per policy  without signs/symptoms or physical distress. Continue progressive overload as per policy without signs/symptoms or physical distress. Continue progressive overload as per policy without signs/symptoms or physical distress.   Resistance Training   Training Prescription Yes Yes Yes Yes Yes   Weight 4 4 4 4 4    Reps 10-15 10-15 10-15 10-15 10-15   Interval Training   Interval Training Yes Yes Yes Yes Yes   Equipment Recumbant Elliptical  REL= BioStep Recumbant Elliptical  REL= BioStep Recumbant Elliptical  REL= BioStep Recumbant Elliptical  REL= BioStep Recumbant Elliptical  REL= BioStep   Recumbant Bike   Level 4 6 8 8 8    RPM 40 50 50 50 50   Minutes 15 15 15 15 15    NuStep   Level 2 2 4   BioStep 4  BioStep 4  BioStep   Watts 35 35 40 40 40   Minutes 20 20 20 20 20     Recumbant Elliptical   Level 3 3 3 3 3    RPM   40 50 50   Watts 25 25 25 25 25    Minutes 20 20 20 20 20    REL-XR   Level  6 6 6 6    Watts  70 70 70 70   Minutes  15 15 15 15       Discharge Exercise Prescription (Final Exercise Prescription Changes):     Exercise Prescription Changes - 03/10/15 1300    Exercise Review   Progression Yes   Response to Exercise   Blood Pressure (Admit) 74/0 mmHg  Doppler   Blood Pressure (Exit) 72/0 mmHg   Heart Rate (Admit) 95 bpm   Heart Rate (Exercise) 128 bpm   Heart Rate (Exit) 82 bpm   Rating of Perceived Exertion (Exercise) 12   Symptoms No   Duration Progress to 30 minutes of continuous aerobic without signs/symptoms of physical distress   Intensity THRR unchanged  REST + 20   Progression Continue progressive overload as per policy without signs/symptoms or physical distress.   Resistance Training   Training Prescription Yes   Weight 4   Reps 10-15   Interval Training   Interval Training Yes   Equipment Recumbant Elliptical  REL= BioStep   Recumbant Bike   Level 8   RPM 50   Minutes 15   NuStep   Level 4  BioStep   Watts 40   Minutes 20   Recumbant Elliptical   Level 3   RPM 50   Watts 25   Minutes 20   REL-XR   Level 6   Watts 70   Minutes 15      Nutrition:  Target Goals: Understanding of nutrition guidelines, daily intake of sodium <1524m, cholesterol <2030m calories 30% from fat and 7% or less from saturated fats, daily to have 5 or more servings of fruits and vegetables.  Biometrics:     Pre Biometrics - 12/13/14 1523    Pre Biometrics   Height 5' 8.5" (1.74 m)   Weight 186 lb 11.2 oz (84.687 kg)   Waist Circumference 40 inches   Hip Circumference 42 inches   Waist to Hip Ratio 0.95 %   BMI (Calculated) 28       Nutrition Therapy Plan and Nutrition Goals:     Nutrition Therapy & Goals - 01/25/15 1359    Personal Nutrition Goals   Comments --  Patient does not wish to meet with dietitian at  this time.      Nutrition Discharge: Rate  Your Plate Scores:     Rate Your Plate - 78/29/56 2130    Rate Your Plate Scores   Post Score 70   Post Score % 77.77 %      Nutrition Goals Re-Evaluation:     Nutrition Goals Re-Evaluation      01/20/15 1207 02/24/15 0956 03/24/15 1120       Personal Goal #1 Re-Evaluation   Personal Goal #1 Has meet with dieticians in the past and know what to eat for his Coumadin INR PT level blood work to be ok.  "the LVAD people take care of suggesting to me what to eat. I am ok and don't need to  meet individually with your(Cardiac Rehab registered )dietician.      Goal Progress Seen  Yes Yes     Comments Is eating healthy.  Still prefers not to meet with the dietician. [name] his wife said she is comfortable cooking for him.         Psychosocial: Target Goals: Acknowledge presence or absence of depression, maximize coping skills, provide positive support system. Participant is able to verbalize types and ability to use techniques and skills needed for reducing stress and depression.  Initial Review & Psychosocial Screening:     Initial Psych Review & Screening - 12/13/14 1418    Initial Review   Current issues with Current Sleep Concerns   Family Dynamics   Good Support System? Yes   Comments Does have counselor with LVAD program.      Barriers   Psychosocial barriers to participate in program There are no identifiable barriers or psychosocial needs.;The patient should benefit from training in stress management and relaxation.   Screening Interventions   Interventions Encouraged to exercise      Quality of Life Scores:     Quality of Life - 03/24/15 1123    Quality of Life Scores   Health/Function Post 18.37 %   Health/Function % Change 25 %   Socioeconomic Post 29.64 %   Socioeconomic % Change 29 %   Psych/Spiritual Post 27.43 %   Psych/Spiritual % Change 18 %   Family Post 30 %   Family % Change 49 %   GLOBAL Post 24.09 %    GLOBAL % Change 28 %      PHQ-9:     Recent Review Flowsheet Data    Depression screen [facility] 2/9 03/24/2015 12/13/2014   Decreased Interest 0 2   Down, Depressed, Hopeless 0 0   PHQ - 2 Score 0 2   Altered sleeping 1 2   Tired, decreased energy 2 3   Change in appetite 0 0   Feeling bad or failure about yourself  0 0   Trouble concentrating 0 0   Moving slowly or fidgety/restless 0 3   Suicidal thoughts 0 0   PHQ-9 Score 3 10   Difficult doing work/chores Not difficult at all Very difficult      Psychosocial Evaluation and Intervention:     Psychosocial Evaluation - 03/08/15 0934    Psychosocial Evaluation & Interventions   Interventions Encouraged to exercise with the program and follow exercise prescription   Comments Counselor met with Mr. B today for psychosocial evaluation.  He is a 69 year old who had a heart attack 10 months ago resulting in an LVAD.  He has a strong support system and is actively involved in his faith community.  He reports to sleeping well and a having a good appetite.  He denies  a history of depression or current symptoms, although he admits to struggling with remaining positive since having the LVAD.  He states he is typically in a positive mood and his primary stress is his health issues.  His goals for this program are to increase his stamina and strength.  He reports exercising consistently both here and at home.  Counselor mentioned the possibility of speaking with a counselor for his occasional depressive symptoms and he reported "it would be a waste of time."     Continued Psychosocial Services Needed --  Mr. B will benefit from the psychoeducational components of this program, especially on depression and stress management.        Psychosocial Re-Evaluation:   Vocational Rehabilitation: Provide vocational rehab assistance to qualifying candidates.   Vocational Rehab Evaluation & Intervention:     Vocational Rehab - 12/13/14 1407    Initial  Vocational Rehab Evaluation & Intervention   Assessment shows need for Vocational Rehabilitation No      Education: Education Goals: Education classes will be provided on a weekly basis, covering required topics. Participant will state understanding/return demonstration of topics presented.  Learning Barriers/Preferences:     Learning Barriers/Preferences - 12/13/14 1406    Learning Barriers/Preferences   Learning Barriers Reading   Learning Preferences Video      Education Topics: General Nutrition Guidelines/Fats and Fiber: -Group instruction provided by verbal, written material, models and posters to present the general guidelines for heart healthy nutrition. Gives an explanation and review of dietary fats and fiber.   Controlling Sodium/Reading Food Labels: -Group verbal and written material supporting the discussion of sodium use in heart healthy nutrition. Review and explanation with models, verbal and written materials for utilization of the food label.   Exercise Physiology & Risk Factors: - Group verbal and written instruction with models to review the exercise physiology of the cardiovascular system and associated critical values. Details cardiovascular disease risk factors and the goals associated with each risk factor.   Aerobic Exercise & Resistance Training: - Gives group verbal and written discussion on the health impact of inactivity. On the components of aerobic and resistive training programs and the benefits of this training and how to safely progress through these programs.   Flexibility, Balance, General Exercise Guidelines: - Provides group verbal and written instruction on the benefits of flexibility and balance training programs. Provides general exercise guidelines with specific guidelines to those with heart or lung disease. Demonstration and skill practice provided.   Stress Management: - Provides group verbal and written instruction about the health  risks of elevated stress, cause of high stress, and healthy ways to reduce stress.   Depression: - Provides group verbal and written instruction on the correlation between heart/lung disease and depressed mood, treatment options, and the stigmas associated with seeking treatment.   Anatomy & Physiology of the Heart: - Group verbal and written instruction and models provide basic cardiac anatomy and physiology, with the coronary electrical and arterial systems. Review of: AMI, Angina, Valve disease, Heart Failure, Cardiac Arrhythmia, Pacemakers, and the ICD.   Cardiac Procedures: - Group verbal and written instruction and models to describe the testing methods done to diagnose heart disease. Reviews the outcomes of the test results. Describes the treatment choices: Medical Management, Angioplasty, or Coronary Bypass Surgery.   Cardiac Medications: - Group verbal and written instruction to review commonly prescribed medications for heart disease. Reviews the medication, class of the drug, and side effects. Includes the steps to properly store meds  and maintain the prescription regimen.   Go Sex-Intimacy & Heart Disease, Get SMART - Goal Setting: - Group verbal and written instruction through game format to discuss heart disease and the return to sexual intimacy. Provides group verbal and written material to discuss and apply goal setting through the application of the S.M.A.R.T. Method.   Other Matters of the Heart: - Provides group verbal, written materials and models to describe Heart Failure, Angina, Valve Disease, and Diabetes in the realm of heart disease. Includes description of the disease process and treatment options available to the cardiac patient.          Cardiac Rehab from 01/13/2015 in [facility] Cardiac Rehab   Date  12/16/14   Educator  CE   Instruction Review Code  2- meets goals/outcomes      Exercise & Equipment Safety: - Individual verbal instruction and demonstration  of equipment use and safety with use of the equipment.      Cardiac Rehab from 01/13/2015 in [facility] Cardiac Rehab   Date  12/13/14   Educator  S Bice   Instruction Review Code  2- meets goals/outcomes      Infection Prevention: - Provides verbal and written material to individual with discussion of infection control including proper hand washing and proper equipment cleaning during exercise session.      Cardiac Rehab from 01/13/2015 in [facility] Cardiac Rehab   Date  12/13/14   Educator  S Bice   Instruction Review Code  2- meets goals/outcomes      Falls Prevention: - Provides verbal and written material to individual with discussion of falls prevention and safety.      Cardiac Rehab from 01/13/2015 in [facility] Cardiac Rehab   Date  12/13/14   Educator  S Bice   Instruction Review Code  2- meets goals/outcomes      Diabetes: - Individual verbal and written instruction to review signs/symptoms of diabetes, desired ranges of glucose level fasting, after meals and with exercise. Advice that pre and post exercise glucose checks will be done for 3 sessions at entry of program.    Knowledge Questionnaire Score:     Knowledge Questionnaire Score - 03/24/15 1119    Knowledge Questionnaire Score   Post Score Not done since Jashawn did not want to attend any Cardiac Rehab formal education. He was instructed on each piece of exercise equipment he used i.e Biostep equipment which is like a recumbent elliptical plusincluding small hand weight resistance training.       Personal Goals and Risk Factors at Admission:     Personal Goals and Risk Factors at Admission - 12/13/14 1410    Personal Goals and Risk Factors on Admission    Weight Management No   Increase Aerobic Exercise and Physical Activity Yes   Intervention While in program, learn and follow the exercise prescription taught. Start at a low level workload and increase workload after able to maintain previous level for 30 minutes.  Increase time before increasing intensity.   Develop more efficient breathing techniques such as purse lipped breathing and diaphragmatic breathing; and practicing self-pacing with activity Yes   Intervention While in program, learn and utilize the specific breathing techniques taught to you. Continue to practice and use the techniques as needed.   Diabetes No   Hypertension No   Lipids Yes   Goal Cholesterol controlled with medications as prescribed, with individualized exercise RX and with personalized nutrition plan. Value goals: LDL < 23m, HDL > 438m Participant states  understanding of desired cholesterol values and following prescriptions.   Intervention Provide nutrition & aerobic exercise along with prescribed medications to achieve LDL <97m, HDL >427m      Personal Goals and Risk Factors Review:      Goals and Risk Factor Review      01/18/15 1504 01/20/15 1208 01/25/15 1011 02/03/15 0919 02/16/15 1207   Increase Aerobic Exercise and Physical Activity   Goals Progress/Improvement seen  Yes Yes Yes Yes Yes   Comments Progressing with exercise prescription Would prefer not to attend the education sessions but only wants to exercise in Cardiac Rehab.  Climbing stairs is noticeably easier, walking in and out of class and when running errands is easier. Would still like to increase his walking endurance so he can go to the grocery store.  feels much stronger, balance has improved    Abnormal Lipids   Goal Cholesterol controlled with medications as prescribed, with individualized exercise RX and with personalized nutrition plan. Value goals: LDL < 7041mHDL > 78m55marticipant states understanding of desired cholesterol values and following prescriptions.       Progress seen towards goals Unknown Yes      Comments no labs to compare       Other Goal   Goals Progress/Improvement seen    Yes Yes    Comments   Learning to breathe properly during execise and during the cool down  enjoys  everything about the program      02/24/15 0957 03/14/15 1505 03/24/15 1121       Increase Aerobic Exercise and Physical Activity   Goals Progress/Improvement seen  Yes Yes Yes     Comments "I am trying to live longer especially after I went through so much when they put this LVAD in. I did it for my wife since she said she wasn't ready for me to leave her yet". "I am trying to live longer especially after I went through so much when they put this LVAD in. I did it for my wife since she said she wasn't ready for me to leave her yet". DaniJacaden been able to increase his exercise tolerance since being in Cardiac Rehab. It has helped him.      Abnormal Lipids   Goal  Cholesterol controlled with medications as prescribed, with individualized exercise RX and with personalized nutrition plan. Value goals: LDL < 70mg48mL > 78mg.51mticipant states understanding of desired cholesterol values and following prescriptions.      Progress seen towards goals  Yes      Comments  no labs to compare         Personal Goals Discharge (Final Personal Goals and Risk Factors Review):      Goals and Risk Factor Review - 03/24/15 1121    Increase Aerobic Exercise and Physical Activity   Goals Progress/Improvement seen  Yes   Comments DanielEvereen able to increase his exercise tolerance since being in Cardiac Rehab. It has helped him.        Comments: DanielSherilleted his post PHQ9 and checked "not difficult at all " for "you to do your work , take care of things at home, or get along with other people. DanielJaishawns brings his back up equipment for his LVAD (Left Ventricular Device). He and his wife took classes and are well equipped and knowledgeable to handle any problems. LVAD has been stable and DanielChanlereen stable during Cardiac Rehab.

## 2015-03-28 ENCOUNTER — Telehealth (HOSPITAL_COMMUNITY): Payer: Self-pay | Admitting: Infectious Diseases

## 2015-03-28 ENCOUNTER — Ambulatory Visit (HOSPITAL_COMMUNITY): Payer: Self-pay | Admitting: Infectious Diseases

## 2015-03-28 DIAGNOSIS — Z5181 Encounter for therapeutic drug level monitoring: Secondary | ICD-10-CM

## 2015-03-28 DIAGNOSIS — I2699 Other pulmonary embolism without acute cor pulmonale: Secondary | ICD-10-CM

## 2015-03-28 DIAGNOSIS — Z95811 Presence of heart assist device: Secondary | ICD-10-CM

## 2015-03-28 LAB — POCT INR: INR: 2.1

## 2015-03-28 NOTE — Telephone Encounter (Signed)
Called back regarding message left on VM in our office. Will report on Wednesday to admissions to check in for IVC filter retrieved at 0630. She reports that the dressing site looks generally better however the irritation immediately around the drive line is persisting. We will be looking at the DL site Wednesday after his procedure. She is changing the dressing twice a week. Consider D/C'ing temporarily the CHG sticks while it heals better. She will bring the powder with her this week.   Janene Madeira, RN, BSN, CCRN  VAD Coordinator   Office: [phone] 24/[place] Pager: [phone]

## 2015-03-29 ENCOUNTER — Other Ambulatory Visit: Payer: Self-pay | Admitting: Radiology

## 2015-03-29 DIAGNOSIS — I252 Old myocardial infarction: Secondary | ICD-10-CM | POA: Diagnosis not present

## 2015-03-29 DIAGNOSIS — I509 Heart failure, unspecified: Secondary | ICD-10-CM | POA: Diagnosis not present

## 2015-03-29 DIAGNOSIS — Z95811 Presence of heart assist device: Secondary | ICD-10-CM | POA: Diagnosis not present

## 2015-03-29 DIAGNOSIS — Z955 Presence of coronary angioplasty implant and graft: Secondary | ICD-10-CM | POA: Diagnosis not present

## 2015-03-29 NOTE — Progress Notes (Signed)
Daily Session Note  Patient Details  Name: Scott [name] MRN: [id number] Date of Birth: [birth year] Referring Provider:  [name], MD  Encounter Date: 03/29/2015  Check In:     Session Check In - 03/29/15 1005    Check-In   Staff Present Diane Joya Gaskins RN, BSN;Other;Renee Dillard Essex MS, ACSM CEP Exercise Physiologist   ER physicians immediately available to respond to emergencies See telemetry face sheet for immediately available ER MD   Medication changes reported     No   Fall or balance concerns reported    No   Warm-up and Cool-down Performed on first and last piece of equipment   VAD Patient? Yes   VAD patient   Has back up controller? Yes   Has spare charged batteries? Yes   Has battery cables? Yes   Has compatible battery clips? Yes   Pain Assessment   Currently in Pain? No/denies           Exercise Prescription Changes - 03/29/15 1000    Exercise Review   Progression Yes   Response to Exercise   Blood Pressure (Admit) 74/0 mmHg  Doppler   Blood Pressure (Exit) 72/0 mmHg   Heart Rate (Admit) 95 bpm   Heart Rate (Exercise) 128 bpm   Heart Rate (Exit) 82 bpm   Rating of Perceived Exertion (Exercise) 12   Symptoms No   Duration Progress to 30 minutes of continuous aerobic without signs/symptoms of physical distress   Intensity THRR unchanged  REST + 20   Progression Continue progressive overload as per policy without signs/symptoms or physical distress.   Resistance Training   Training Prescription Yes   Weight 4   Reps 10-15   Interval Training   Interval Training Yes   Equipment Recumbant Elliptical  REL= BioStep   Recumbant Bike   Level 8   RPM 50   Minutes 15   NuStep   Level 4  BioStep   Watts 40   Minutes 20   Recumbant Elliptical   Level 4   RPM 55   Watts 30   Minutes 20   REL-XR   Level 6   Watts 70   Minutes 15      Goals Met:  Independence with exercise equipment Exercise tolerated well No report of cardiac  concerns or symptoms Strength training completed today  Goals Unmet:  Not Applicable  Goals Comments:    Dr. Emily Filbert is Medical Director for [facility] and [facility].

## 2015-03-30 ENCOUNTER — Ambulatory Visit (HOSPITAL_COMMUNITY)
Admission: RE | Admit: 2015-03-30 | Discharge: 2015-03-30 | Disposition: A | Discharge status: Home / Self Care | Payer: Medicare Other | Source: Ambulatory Visit | Attending: Internal Medicine | Admitting: Internal Medicine

## 2015-03-30 ENCOUNTER — Encounter (HOSPITAL_COMMUNITY): Payer: Self-pay

## 2015-03-30 DIAGNOSIS — J449 Chronic obstructive pulmonary disease, unspecified: Secondary | ICD-10-CM | POA: Diagnosis not present

## 2015-03-30 DIAGNOSIS — Z86718 Personal history of other venous thrombosis and embolism: Secondary | ICD-10-CM | POA: Insufficient documentation

## 2015-03-30 DIAGNOSIS — I2510 Atherosclerotic heart disease of native coronary artery without angina pectoris: Secondary | ICD-10-CM | POA: Insufficient documentation

## 2015-03-30 DIAGNOSIS — I2699 Other pulmonary embolism without acute cor pulmonale: Secondary | ICD-10-CM | POA: Diagnosis not present

## 2015-03-30 DIAGNOSIS — I255 Ischemic cardiomyopathy: Secondary | ICD-10-CM | POA: Insufficient documentation

## 2015-03-30 DIAGNOSIS — Z4689 Encounter for fitting and adjustment of other specified devices: Secondary | ICD-10-CM | POA: Diagnosis not present

## 2015-03-30 DIAGNOSIS — Z79899 Other long term (current) drug therapy: Secondary | ICD-10-CM | POA: Insufficient documentation

## 2015-03-30 DIAGNOSIS — Z7901 Long term (current) use of anticoagulants: Secondary | ICD-10-CM | POA: Diagnosis not present

## 2015-03-30 DIAGNOSIS — I5022 Chronic systolic (congestive) heart failure: Secondary | ICD-10-CM | POA: Diagnosis not present

## 2015-03-30 DIAGNOSIS — Z95811 Presence of heart assist device: Secondary | ICD-10-CM | POA: Insufficient documentation

## 2015-03-30 DIAGNOSIS — Z86711 Personal history of pulmonary embolism: Secondary | ICD-10-CM | POA: Insufficient documentation

## 2015-03-30 LAB — BASIC METABOLIC PANEL
ANION GAP: 9 (ref 5–15)
BUN: 17 mg/dL (ref 6–20)
CHLORIDE: 105 mmol/L (ref 101–111)
CO2: 25 mmol/L (ref 22–32)
CREATININE: 0.97 mg/dL (ref 0.61–1.24)
Calcium: 9.6 mg/dL (ref 8.9–10.3)
GFR calc non Af Amer: >60 mL/min (ref >60)
Glucose, Bld: 119 mg/dL — ABNORMAL HIGH (ref 65–99)
POTASSIUM: 4.1 mmol/L (ref 3.5–5.1)
Sodium: 139 mmol/L (ref 135–145)

## 2015-03-30 LAB — CBC
HEMATOCRIT: 40 % (ref 39.0–52.0)
HEMOGLOBIN: 12.9 g/dL — AB (ref 13.0–17.0)
MCH: 28 pg (ref 26.0–34.0)
MCHC: 32.3 g/dL (ref 30.0–36.0)
MCV: 87 fL (ref 78.0–100.0)
PLATELETS: 207 10*3/uL (ref 150–400)
RBC: 4.6 MIL/uL (ref 4.22–5.81)
RDW: 17.5 % — ABNORMAL HIGH (ref 11.5–15.5)
WBC: 8 10*3/uL (ref 4.0–10.5)

## 2015-03-30 LAB — PROTIME-INR
INR: 1.97 — ABNORMAL HIGH (ref 0.00–1.49)
Prothrombin Time: 22.3 seconds — ABNORMAL HIGH (ref 11.6–15.2)

## 2015-03-30 MED ORDER — LIDOCAINE HCL 1 % IJ SOLN
INTRAMUSCULAR | Status: AC
  Filled 2015-03-30: qty 20

## 2015-03-30 MED ORDER — ACETAMINOPHEN 500 MG PO TABS
500.0000 mg | ORAL_TABLET | Freq: Once | ORAL | Status: DC
  Filled 2015-03-30: qty 1

## 2015-03-30 MED ORDER — SODIUM CHLORIDE 0.9 % IV SOLN: INTRAVENOUS | Status: DC

## 2015-03-30 MED ORDER — MIDAZOLAM HCL 2 MG/2ML IJ SOLN
INTRAMUSCULAR | Status: AC
  Filled 2015-03-30: qty 2

## 2015-03-30 MED ORDER — ACETAMINOPHEN 500 MG PO TABS
ORAL_TABLET | ORAL | Status: AC
  Filled 2015-03-30: qty 2

## 2015-03-30 MED ORDER — ACETAMINOPHEN 500 MG PO TABS
1000.0000 mg | ORAL_TABLET | Freq: Once | ORAL | Status: AC
  Administered 2015-03-30: 1000 mg via ORAL

## 2015-03-30 MED ORDER — FENTANYL CITRATE (PF) 100 MCG/2ML IJ SOLN
INTRAMUSCULAR | Status: AC
  Filled 2015-03-30: qty 2

## 2015-03-30 MED ORDER — FENTANYL CITRATE (PF) 100 MCG/2ML IJ SOLN
INTRAMUSCULAR | Status: AC | PRN
  Administered 2015-03-30: 50 ug via INTRAVENOUS

## 2015-03-30 MED ORDER — MIDAZOLAM HCL 2 MG/2ML IJ SOLN
INTRAMUSCULAR | Status: AC | PRN
  Administered 2015-03-30: 1 mg via INTRAVENOUS

## 2015-03-30 NOTE — Sedation Documentation (Signed)
Patient is resting comfortably. 

## 2015-03-30 NOTE — Procedures (Signed)
Post-Procedure Note  Pre-operative Diagnosis: History of chronic PE and has IVC filter       Post-operative Diagnosis: Same   Indications: Filter no longer needed, patient is anticoagulated.  Procedure Details:   IVC venogram performed.  IVC filter removed without complication.    Findings: IVC patent.  Filter removed without complication.    Complications: No immediate      Condition: Stable  EBL:  Minimal  Plan: Recovery for 2 hours.  Resume diet.

## 2015-03-30 NOTE — Discharge Instructions (Signed)
CALL [phone] IF ANY PROBLEMS,QUESTIONS, OR CONCERNS; CALL IF ANY BLEEDING,DRAINAGE,REDNESS,PAIN, OR SWELLING AT SITE AND CALL IF FEVER

## 2015-03-30 NOTE — Progress Notes (Signed)
VAD Coordinator Procedure Note:   Patient underwent IVC filter removal in Interventional Radiology per Dr. Anselm Pancoast. Hemodynamics and VAD parameters monitored by me throughout the procedure. MAPs were obtained with manual BP cuff and correlated with automatic cuff MAP.     Doppler Auto BP (MAP) Flow: PI: Power:      Speed:    MAP Pre-procedure:  9:00  86 97/85 (88)  4.8  6.6  5.4  9:10   96/80 (84)  4.8  6.1  5.3    Sedation Induction:   9:15   98/86 (89)  4.6  6.4  5.2  9:30   92/62 (70)  4.8  6.2  5.3  9:40   90/77 (80)  4.8  5.9  5.3  9:45   89/73 (77)  4.8  5.9  5.4  9:50   90/74 (78)  4.8  6.0  5.4  Post procedure:  10:00   94/78 (81)  4.8  5.9  5.4  Patient tolerated the procedure well. PI drops responded to volume.  After adequate sedation was achieved, pulse ox maintained >92% throughout the remainder of the procedure. MAPs were stable.   Patient Disposition: short stay

## 2015-03-30 NOTE — H&P (Signed)
HPI: Patient with history of bilateral PE with pulmonary infarct and RLE DVT in setting of ischemic cardiomyopathy requiring LVAD S/p IVC filter 08/2014- on coumadin with INR 1.97 today- no signs of bleeding. He denies any chest pain or worsening shortness of breath. He denies any new LE swelling or pain.  Seen by Dr. Haroldine Laws with repeat CTA chest and LE venous duplex no acute thrombus. Request for image guided IVC filter removal today.   The patient has had a H&P performed within the last 30 days, all history, medications, and exam have been reviewed. The patient denies any interval changes since the H&P.  Medications: Prior to Admission medications   Medication Sig Start Date End Date Taking? Authorizing Provider  acetaminophen (TYLENOL) 500 MG tablet Take 1,000 mg by mouth every 6 (six) hours as needed for moderate pain.    Yes Historical Provider, MD  furosemide (LASIX) 20 MG tablet Every other day alternate 20 mg (1 pill) with 40 mg (2 pills) Patient taking differently: Take 20-40 mg by mouth daily. Every other day alternate 20 mg (1 pill) with 40 mg (2 pills) 12/22/14  Yes [name], MD  metaxalone (SKELAXIN) 800 MG tablet Take 1 tablet (800 mg total) by mouth daily as needed for muscle spasms. 11/02/14  Yes Amy D Clegg, NP  pantoprazole (PROTONIX) 40 MG tablet Take 1 tablet (40 mg total) by mouth at bedtime. 11/02/14  Yes Amy D Clegg, NP  potassium chloride SA (K-DUR,KLOR-CON) 20 MEQ tablet Take 1 tablet (20 mEq total) by mouth as directed. Take 1 pill with 20 mg Lasix daily and 2 pills with 40 mg Lasix daily 12/22/14  Yes [name], MD  rosuvastatin (CRESTOR) 10 MG tablet Take 10 mg by mouth at bedtime.    Yes Historical Provider, MD  traZODone (DESYREL) 100 MG tablet Take 1 tablet (100 mg total) by mouth at bedtime. 12/02/14  Yes Amy D Ninfa Meeker, NP  warfarin (COUMADIN) 4 MG tablet Take 1 tablet (4 mg total) by mouth daily at 6 PM. Take 1 pill every day except 1/2 pill Wednesday  and Fridays. Or otherwise prescribed. Patient taking differently: Take 4 mg by mouth as directed. Takes 4mg  every Monday & Friday 11/25/14  Yes [name], MD  warfarin (COUMADIN) 6 MG tablet Take 1 tablet (6 mg total) by mouth daily. Or as directed Patient taking differently: Take 6 mg by mouth as directed. Takes 6mg  daily on Tuesdays, Wendesdays, Thursdays, Saturdays, and Sundays. 01/26/15  Yes Amy D Clegg, NP  amoxicillin (AMOXIL) 500 MG capsule Take 2,000 mg by mouth daily as needed.    Historical Provider, MD  fluconazole (DIFLUCAN) 200 MG tablet Take 1 tablet (200 mg total) by mouth once. Patient not taking: Reported on 03/28/2015 03/16/15   [name], MD  meclizine (ANTIVERT) 25 MG tablet Take 25 mg by mouth 3 (three) times daily as needed (for headache).    Historical Provider, MD  nystatin (MYCOSTATIN) powder Apply topically 2 (two) times daily. Patient not taking: Reported on 03/28/2015 03/16/15   [name], MD     Vital Signs: Pulse 99  Temp(Src) 98.5 F (36.9 C) (Oral)  Resp 18  Ht 5\' 10"  (1.778 m)  Wt 184 lb (83.462 kg)  BMI 26.40 kg/m2  SpO2 97%  Physical Exam  Constitutional: He is oriented to person, place, and time. No distress.  HENT:  Head: Normocephalic and atraumatic.  Cardiovascular: Normal rate and regular rhythm.   lvad  hum   Pulmonary/Chest: Effort normal and breath sounds normal. No respiratory distress. He has no wheezes. He has no rales.  Abdominal: Soft. Bowel sounds are normal.  Musculoskeletal: He exhibits no edema.  Neurological: He is alert and oriented to person, place, and time.  Skin: Skin is warm and dry. He is not diaphoretic.    Mallampati Score:  MD Evaluation Airway: WNL Heart: WNL Abdomen: WNL Chest/ Lungs: WNL ASA  Classification: 3 Mallampati/Airway Score: Two  Labs:  CBC:  Recent Labs  01/12/15 1145 02/09/15 0915 03/16/15 1000 03/30/15 0651  WBC 10.3 8.7 9.2 8.0  HGB 11.9* 13.0 13.0 12.9*  HCT  38.2* 41.4 40.6 40.0  PLT 340 253 228 207    COAGS:  Recent Labs  10/05/14 1205 10/05/14 1700  10/06/14 0719 10/06/14 0930  03/21/15 03/23/15 03/28/15 03/30/15 0651  INR 1.78* 2.02*  < > 1.10 1.26  < > 2.4 3.3 2.1 1.97*  APTT 34 38*  --  34 35  --   --   --   --   --   < > = values in this interval not displayed.  BMP:  Recent Labs  01/12/15 1145 02/09/15 0915 03/16/15 1000 03/30/15 0651  NA 140 137 138 139  K 4.7 4.5 4.3 4.1  CL 106 102 107 105  CO2 27 28 25 25   GLUCOSE 97 107* 146* 119*  BUN 12 12 14 17   CALCIUM 9.8 10.0 9.8 9.6  CREATININE 1.02 1.07 1.18 0.97  GFRNONAA >60 >60 >60 >60  GFRAA >60 >60 >60 >60    LIVER FUNCTION TESTS:  Recent Labs  10/18/14 0401 10/19/14 0450 12/22/14 1050 02/09/15 0915  BILITOT 0.9 0.7 0.6 0.8  AST 32 38* 26 38  ALT 29 30 23 29   ALKPHOS 282* 277* 242* 176*  PROT 5.8* 5.4* 7.5 8.6*  ALBUMIN 2.1* 2.1* 2.8* 3.8    Assessment/Plan:  History of bilateral PE with pulmonary infarct and RLE DVT in setting of ischemic cardiomyopathy requiring LVAD S/p IVC filter 08/2014- on coumadin with INR 1.97 today- no signs of bleeding Seen by Dr. Haroldine Laws with repeat CTA chest and LE venous duplex no acute thrombus Request for image guided IVC filter removal with sedation The patient has been NPO, labs and vitals have been reviewed. Risks and Benefits discussed with the patient including, but not limited to bleeding, infection, contrast induced renal failure, damage to filter or inability to remove filter. All of the patient's questions were answered, patient is agreeable to proceed. Consent signed and in chart. Chronic systolic HF, ischemic cardiomyopathy EF 20% LVAD in place CAD s/p stenting  COPD   Signed: Hedy Jacob 03/30/2015, 8:13 AM

## 2015-03-31 NOTE — Progress Notes (Signed)
VAD dressing removed and site care performed using sterile technique. Drive line exit site cleaned with saline, allowed to dry, and gauze dressing re-applied with aquacel silver on exit site.  Fungal rash remains reddened, but dry and flaking skin Wife has been changing dressing every 2 - 3 days to and using Nystatin powder on fungal rash. Dressing has foul odor, but exit site/silver does; no drainage, tenderness noted.  Exit site well healed and incorporated. The velour is fully implanted at exit site. Drive line anchor intact. Pt denies fever or chills.    Reviewed with Darrick Grinder, NP - instructed wife to increase dressing changes to daily, continue cleaning with Chloraprep and using skin prep under tape areas; she is also to continue using nystatin powder.  She is to call if site worsens or does not improve and bring pt to clinic for wound check.  Pt has VAD clinic appt in two weeks. Wife verbalized understanding of same.

## 2015-04-04 ENCOUNTER — Telehealth (HOSPITAL_COMMUNITY): Payer: Self-pay | Admitting: *Deleted

## 2015-04-04 ENCOUNTER — Ambulatory Visit (HOSPITAL_COMMUNITY): Payer: Medicare Other | Admitting: *Deleted

## 2015-04-04 DIAGNOSIS — Z7901 Long term (current) use of anticoagulants: Secondary | ICD-10-CM

## 2015-04-04 DIAGNOSIS — Z95811 Presence of heart assist device: Secondary | ICD-10-CM

## 2015-04-04 LAB — POCT INR: INR: 2

## 2015-04-04 NOTE — Telephone Encounter (Signed)
Wife called and left message to report VAD driveline site has new "blisters" formation secondary to fungal infection. Called pt/wife per Dr. Haroldine Laws and instructed him to take Diflucan 200 mg daily for three days (wife has supply at home).  Pt will come to clinic Thursday at 1 pm for wound check with INR (offered wound clinic appt Tues or Wednesday of this week, but pt wants Thursday appt for wound check).

## 2015-04-05 ENCOUNTER — Encounter: Payer: Medicare Other | Attending: Internal Medicine

## 2015-04-05 VITALS — Ht 68.5 in | Wt 184.0 lb

## 2015-04-05 DIAGNOSIS — I252 Old myocardial infarction: Secondary | ICD-10-CM | POA: Insufficient documentation

## 2015-04-05 DIAGNOSIS — Z95811 Presence of heart assist device: Secondary | ICD-10-CM | POA: Insufficient documentation

## 2015-04-05 DIAGNOSIS — I509 Heart failure, unspecified: Secondary | ICD-10-CM | POA: Insufficient documentation

## 2015-04-05 DIAGNOSIS — Z955 Presence of coronary angioplasty implant and graft: Secondary | ICD-10-CM | POA: Diagnosis not present

## 2015-04-05 NOTE — Progress Notes (Signed)
Daily Session Note  Patient Details  Name: Scott [name] MRN: [id number] Date of Birth: [birth year] Referring Provider:  [name], MD  Encounter Date: 04/05/2015  Check In:     Session Check In - 04/05/15 1009    Check-In   Staff Present Candiss Norse MS, ACSM CEP Exercise Physiologist;Other;Diane Mariana Arn, BSN   ER physicians immediately available to respond to emergencies See telemetry face sheet for immediately available ER MD   Medication changes reported     No   Fall or balance concerns reported    No   Warm-up and Cool-down Performed on first and last piece of equipment   VAD Patient? Yes   VAD patient   Has back up controller? Yes   Has spare charged batteries? Yes   Has battery cables? Yes   Has compatible battery clips? Yes   Pain Assessment   Currently in Pain? No/denies           Exercise Prescription Changes - 04/05/15 0900    Exercise Review   Progression Yes   Response to Exercise   Blood Pressure (Admit) --   Blood Pressure (Exit) --   Heart Rate (Admit) --   Heart Rate (Exercise) --   Heart Rate (Exit) --   Rating of Perceived Exertion (Exercise) --   Symptoms No   Comments Patient is approaching graduation of the program and home exercise plans were discussed. Details of the patient's exercise prescription and what they need to do in order to continue the prescription and progress with exercise were outlined and the patient verbalized understanding. The patient plans to complete all exercise at home. He does exercise twice a day at 8am and 3pm and walks and does chair aerobics.    Duration Progress to 30 minutes of continuous aerobic without signs/symptoms of physical distress   Intensity THRR unchanged  REST + 20   Progression Continue progressive overload as per policy without signs/symptoms or physical distress.   Resistance Training   Training Prescription Yes   Weight 4   Reps 10-15   Interval Training   Interval Training  Yes   Equipment Recumbant Elliptical  REL= BioStep   Recumbant Bike   Level 8   RPM 50   Minutes 15   NuStep   Level 4  BioStep   Watts 40   Minutes 20   Recumbant Elliptical   Level 4   RPM 55   Watts 30   Minutes 20   REL-XR   Level 6   Watts 70   Minutes 15   Home Exercise Plan   Plans to continue exercise at Home      Goals Met:  Independence with exercise equipment Exercise tolerated well No report of cardiac concerns or symptoms Strength training completed today  Goals Unmet:  Not Applicable  Goals Comments:    Dr. Emily Filbert is Medical Director for [facility] and [facility].

## 2015-04-07 ENCOUNTER — Ambulatory Visit (HOSPITAL_COMMUNITY): Payer: Self-pay | Admitting: *Deleted

## 2015-04-07 ENCOUNTER — Ambulatory Visit (HOSPITAL_COMMUNITY)
Admission: RE | Admit: 2015-04-07 | Discharge: 2015-04-07 | Disposition: A | Discharge status: Home / Self Care | Payer: Medicare Other | Source: Ambulatory Visit | Attending: Cardiology | Admitting: Cardiology

## 2015-04-07 DIAGNOSIS — I5022 Chronic systolic (congestive) heart failure: Secondary | ICD-10-CM

## 2015-04-07 DIAGNOSIS — Z7901 Long term (current) use of anticoagulants: Secondary | ICD-10-CM | POA: Insufficient documentation

## 2015-04-07 DIAGNOSIS — H5213 Myopia, bilateral: Secondary | ICD-10-CM | POA: Diagnosis not present

## 2015-04-07 DIAGNOSIS — Z5181 Encounter for therapeutic drug level monitoring: Secondary | ICD-10-CM | POA: Insufficient documentation

## 2015-04-07 DIAGNOSIS — H2512 Age-related nuclear cataract, left eye: Secondary | ICD-10-CM | POA: Diagnosis not present

## 2015-04-07 DIAGNOSIS — H25012 Cortical age-related cataract, left eye: Secondary | ICD-10-CM | POA: Diagnosis not present

## 2015-04-07 DIAGNOSIS — I708 Atherosclerosis of other arteries: Secondary | ICD-10-CM | POA: Diagnosis not present

## 2015-04-07 LAB — PROTIME-INR
INR: 2.29 — AB (ref 0.00–1.49)
PROTHROMBIN TIME: 25 s — AB (ref 11.6–15.2)

## 2015-04-07 NOTE — Progress Notes (Signed)
Patient presented to clinic for wound check and INR.   Gauze dressing with aquacel silver removed, skin reddened and macerated under dressing and tape border. Wife has been performing daily dressing changes and using nystatin powder as directed.       Scott Grinder, NP in to assess site and recommends to stop using chlorahexadine prep sticks and skin prep. We will switch to Mepilex dressing. Pt finished additional 3 day course of Diflucan (took Mon/Tue/Wed of this week) with "drying of skin blisters" per wife, but overall site has not improved.  Site now with contact dermatitis per Scott Grinder, NP.  Pt requested dermatology referral, will reach out to Dr. Delman Cheadle for appt asap.  Instructed wife to change dressing this Sunday, will see pt back in clinic Wednesday, 04/13/15 for full clinic visit with wound check. Dressing supplies provided to pt. Instructed to call if site does not improve or worsens prior to next visit. Wife verbalized understanding of same.

## 2015-04-08 ENCOUNTER — Telehealth (HOSPITAL_COMMUNITY): Payer: Self-pay | Admitting: Infectious Diseases

## 2015-04-08 DIAGNOSIS — L309 Dermatitis, unspecified: Secondary | ICD-10-CM | POA: Diagnosis not present

## 2015-04-08 DIAGNOSIS — A499 Bacterial infection, unspecified: Secondary | ICD-10-CM | POA: Diagnosis not present

## 2015-04-08 NOTE — Telephone Encounter (Signed)
Called to update [place] about dermatology visit--started him on Keflex 500 mg BID x 10d for secondary infection after contact dermatitis. The dermatologist inspected the contents of the kit and advised to try the weekly kit if it will stick. They will bring him back next week for allergy testing to attempt to determine what [name] is allergic to. [name] reports that the Allevyn dressing has fallen off; she attempted to put a new larger one on over the DL site; reports she also repositioned the anchor lower to avoid the inflamed skin.   They will come next Wednesday to clinic for a wound check and VAD visit.   Janene Madeira, RN VAD Coordinator   Office: [phone] 24/7 VAD Pager: [phone]

## 2015-04-11 ENCOUNTER — Ambulatory Visit (HOSPITAL_COMMUNITY): Payer: Medicare Other | Admitting: Infectious Diseases

## 2015-04-11 DIAGNOSIS — Z95811 Presence of heart assist device: Secondary | ICD-10-CM

## 2015-04-11 DIAGNOSIS — Z5181 Encounter for therapeutic drug level monitoring: Secondary | ICD-10-CM

## 2015-04-11 DIAGNOSIS — I2699 Other pulmonary embolism without acute cor pulmonale: Secondary | ICD-10-CM

## 2015-04-11 DIAGNOSIS — L239 Allergic contact dermatitis, unspecified cause: Secondary | ICD-10-CM | POA: Diagnosis not present

## 2015-04-11 LAB — POCT INR: INR: 3.6

## 2015-04-12 DIAGNOSIS — Z95811 Presence of heart assist device: Secondary | ICD-10-CM | POA: Diagnosis not present

## 2015-04-12 DIAGNOSIS — I509 Heart failure, unspecified: Secondary | ICD-10-CM | POA: Diagnosis not present

## 2015-04-12 DIAGNOSIS — I252 Old myocardial infarction: Secondary | ICD-10-CM | POA: Diagnosis not present

## 2015-04-12 DIAGNOSIS — Z955 Presence of coronary angioplasty implant and graft: Secondary | ICD-10-CM | POA: Diagnosis not present

## 2015-04-12 NOTE — Progress Notes (Signed)
Daily Session Note  Patient Details  Name: Scott [name] MRN: [id number] Date of Birth: 1945-10-31 Referring Provider:  [name], MD  Encounter Date: 04/12/2015  Check In:     Session Check In - 04/12/15 0910    Check-In   Staff Present Other;Diane Joya Gaskins RN, BSN;Renee Dillard Essex MS, ACSM CEP Exercise Physiologist   ER physicians immediately available to respond to emergencies See telemetry face sheet for immediately available ER MD   Medication changes reported     No   Fall or balance concerns reported    No   Warm-up and Cool-down Performed on first and last piece of equipment   VAD Patient? Yes   VAD patient   Has back up controller? Yes   Has spare charged batteries? Yes   Has battery cables? Yes   Has compatible battery clips? Yes   Pain Assessment   Currently in Pain? No/denies           Exercise Prescription Changes - 04/11/15 1400    Exercise Review   Progression Yes   Response to Exercise   Blood Pressure (Admit) 86/0 mmHg   Blood Pressure (Exit) 78/0 mmHg   Heart Rate (Admit) 82 bpm   Heart Rate (Exercise) 130 bpm   Heart Rate (Exit) 94 bpm   Rating of Perceived Exertion (Exercise) 12   Symptoms No   Comments Patient is approaching graduation of the program and home exercise plans were discussed. Details of the patient's exercise prescription and what they need to do in order to continue the prescription and progress with exercise were outlined and the patient verbalized understanding. The patient plans to complete all exercise at home. He does exercise twice a day at 8am and 3pm and walks and does chair aerobics.    Duration Progress to 30 minutes of continuous aerobic without signs/symptoms of physical distress   Intensity THRR unchanged  REST + 20   Progression Continue progressive overload as per policy without signs/symptoms or physical distress.   Resistance Training   Training Prescription Yes   Weight 4   Reps 10-15   Interval  Training   Interval Training Yes   Equipment Recumbant Elliptical  REL= BioStep   Recumbant Bike   Level 8   RPM 50   Minutes 15   NuStep   Level 4  BioStep   Watts 40   Minutes 20   Recumbant Elliptical   Level 4   RPM 55   Watts 30   Minutes 20   REL-XR   Level 6   Watts 70   Minutes 15   Home Exercise Plan   Plans to continue exercise at Home      Goals Met:  Independence with exercise equipment Exercise tolerated well No report of cardiac concerns or symptoms Strength training completed today  Goals Unmet:  Not Applicable  Goals Comments:    Dr. Emily Filbert is Medical Director for [facility] and [facility].

## 2015-04-13 ENCOUNTER — Encounter: Payer: Self-pay | Admitting: *Deleted

## 2015-04-13 ENCOUNTER — Ambulatory Visit (HOSPITAL_COMMUNITY)
Admission: RE | Admit: 2015-04-13 | Discharge: 2015-04-13 | Disposition: A | Discharge status: Home / Self Care | Payer: Medicare Other | Source: Ambulatory Visit | Attending: Cardiology | Admitting: Cardiology

## 2015-04-13 ENCOUNTER — Other Ambulatory Visit (HOSPITAL_COMMUNITY): Payer: Self-pay | Admitting: *Deleted

## 2015-04-13 ENCOUNTER — Encounter: Payer: Self-pay | Admitting: Licensed Clinical Social Worker

## 2015-04-13 ENCOUNTER — Ambulatory Visit (HOSPITAL_COMMUNITY): Payer: Self-pay | Admitting: *Deleted

## 2015-04-13 VITALS — BP 80/0 | HR 70 | Ht 68.0 in | Wt 188.4 lb

## 2015-04-13 DIAGNOSIS — Z79899 Other long term (current) drug therapy: Secondary | ICD-10-CM | POA: Diagnosis not present

## 2015-04-13 DIAGNOSIS — I10 Essential (primary) hypertension: Secondary | ICD-10-CM | POA: Diagnosis not present

## 2015-04-13 DIAGNOSIS — R0600 Dyspnea, unspecified: Secondary | ICD-10-CM

## 2015-04-13 DIAGNOSIS — Z86711 Personal history of pulmonary embolism: Secondary | ICD-10-CM | POA: Diagnosis not present

## 2015-04-13 DIAGNOSIS — I509 Heart failure, unspecified: Secondary | ICD-10-CM

## 2015-04-13 DIAGNOSIS — I5022 Chronic systolic (congestive) heart failure: Secondary | ICD-10-CM | POA: Diagnosis not present

## 2015-04-13 DIAGNOSIS — R21 Rash and other nonspecific skin eruption: Secondary | ICD-10-CM | POA: Insufficient documentation

## 2015-04-13 DIAGNOSIS — Z95811 Presence of heart assist device: Secondary | ICD-10-CM | POA: Insufficient documentation

## 2015-04-13 DIAGNOSIS — Z86718 Personal history of other venous thrombosis and embolism: Secondary | ICD-10-CM | POA: Diagnosis not present

## 2015-04-13 DIAGNOSIS — E119 Type 2 diabetes mellitus without complications: Secondary | ICD-10-CM | POA: Insufficient documentation

## 2015-04-13 DIAGNOSIS — I2510 Atherosclerotic heart disease of native coronary artery without angina pectoris: Secondary | ICD-10-CM | POA: Insufficient documentation

## 2015-04-13 DIAGNOSIS — Z8249 Family history of ischemic heart disease and other diseases of the circulatory system: Secondary | ICD-10-CM | POA: Insufficient documentation

## 2015-04-13 DIAGNOSIS — M459 Ankylosing spondylitis of unspecified sites in spine: Secondary | ICD-10-CM | POA: Insufficient documentation

## 2015-04-13 DIAGNOSIS — I252 Old myocardial infarction: Secondary | ICD-10-CM | POA: Diagnosis not present

## 2015-04-13 DIAGNOSIS — J449 Chronic obstructive pulmonary disease, unspecified: Secondary | ICD-10-CM | POA: Diagnosis not present

## 2015-04-13 DIAGNOSIS — I255 Ischemic cardiomyopathy: Secondary | ICD-10-CM | POA: Diagnosis not present

## 2015-04-13 DIAGNOSIS — Z87891 Personal history of nicotine dependence: Secondary | ICD-10-CM | POA: Insufficient documentation

## 2015-04-13 DIAGNOSIS — Z7901 Long term (current) use of anticoagulants: Secondary | ICD-10-CM | POA: Insufficient documentation

## 2015-04-13 DIAGNOSIS — Z955 Presence of coronary angioplasty implant and graft: Secondary | ICD-10-CM | POA: Diagnosis not present

## 2015-04-13 LAB — BASIC METABOLIC PANEL
ANION GAP: 7 (ref 5–15)
BUN: 16 mg/dL (ref 6–20)
CALCIUM: 9.8 mg/dL (ref 8.9–10.3)
CO2: 27 mmol/L (ref 22–32)
Chloride: 102 mmol/L (ref 101–111)
Creatinine, Ser: 1.07 mg/dL (ref 0.61–1.24)
Glucose, Bld: 113 mg/dL — ABNORMAL HIGH (ref 65–99)
Potassium: 4.7 mmol/L (ref 3.5–5.1)
Sodium: 136 mmol/L (ref 135–145)

## 2015-04-13 LAB — CBC
HCT: 42.8 % (ref 39.0–52.0)
HEMOGLOBIN: 13.9 g/dL (ref 13.0–17.0)
MCH: 28.6 pg (ref 26.0–34.0)
MCHC: 32.5 g/dL (ref 30.0–36.0)
MCV: 88.1 fL (ref 78.0–100.0)
Platelets: 199 10*3/uL (ref 150–400)
RBC: 4.86 MIL/uL (ref 4.22–5.81)
RDW: 17.1 % — ABNORMAL HIGH (ref 11.5–15.5)
WBC: 8.8 10*3/uL (ref 4.0–10.5)

## 2015-04-13 LAB — PROTIME-INR
INR: 2.26 — ABNORMAL HIGH (ref 0.00–1.49)
PROTHROMBIN TIME: 24.8 s — AB (ref 11.6–15.2)

## 2015-04-13 LAB — LACTATE DEHYDROGENASE: LDH: 286 U/L — AB (ref 98–192)

## 2015-04-13 LAB — BRAIN NATRIURETIC PEPTIDE: B NATRIURETIC PEPTIDE 5: 313 pg/mL — AB (ref 0.0–100.0)

## 2015-04-13 NOTE — Progress Notes (Signed)
CSW met with wife in the clinic. Wife spoke at length about recent challenges with infection, multiple MD appointments and scheduled cataract surgery planned for the end of the month. CSW offered support and encouraged wife and patient to attend the LVAD Support Group this month. Wife very hopeful to attend meeting and will call if needs arise. CSW will continue to follow for support as needed. Jackie Brennan, LCSW [phone] 

## 2015-04-13 NOTE — Progress Notes (Signed)
Cardiac Individual Treatment Plan  Patient Details  Name: Scott [name] MRN: [id number] Date of Birth: 04/12/1946 Referring Provider:  No ref. provider found  Initial Encounter Date:    Visit Diagnosis: LVAD (left ventricular assist device) present (HCC)  Congestive heart failure, unspecified congestive heart failure chronicity, unspecified congestive heart failure type (HCC)  Patient's Home Medications on Admission:  Current outpatient prescriptions:  .  acetaminophen (TYLENOL) 500 MG tablet, Take 1,000 mg by mouth every 6 (six) hours as needed for moderate pain. , Disp: , Rfl:  .  cephALEXin (KEFLEX) 500 MG capsule, Take 500 mg by mouth 2 (two) times daily. For 10 days for contact dermatitis, Disp: , Rfl:  .  clobetasol cream (TEMOVATE) 0.05 %, Apply 1 application topically 2 (two) times daily. Apply to affected area twice daily, Disp: , Rfl:  .  furosemide (LASIX) 20 MG tablet, Every other day alternate 20 mg (1 pill) with 40 mg (2 pills), Disp: 60 tablet, Rfl: 6 .  meclizine (ANTIVERT) 25 MG tablet, Take 25 mg by mouth 3 (three) times daily as needed (for headache)., Disp: , Rfl:  .  metaxalone (SKELAXIN) 800 MG tablet, Take 1 tablet (800 mg total) by mouth daily as needed for muscle spasms., Disp: 60 tablet, Rfl: 6 .  pantoprazole (PROTONIX) 40 MG tablet, Take 1 tablet (40 mg total) by mouth at bedtime., Disp: 30 tablet, Rfl: 6 .  potassium chloride SA (K-DUR,KLOR-CON) 20 MEQ tablet, Take 1 tablet (20 mEq total) by mouth as directed. Take 1 pill with 20 mg Lasix daily and 2 pills with 40 mg Lasix daily, Disp: 60 tablet, Rfl: 6 .  rosuvastatin (CRESTOR) 10 MG tablet, Take 10 mg by mouth at bedtime. , Disp: , Rfl:  .  traZODone (DESYREL) 100 MG tablet, Take 1 tablet (100 mg total) by mouth at bedtime., Disp: 30 tablet, Rfl: 6 .  warfarin (COUMADIN) 4 MG tablet, Take 1 tablet (4 mg total) by mouth daily at 6 PM. Take 1 pill every day except 1/2 pill Wednesday and Fridays. Or  otherwise prescribed. (Patient taking differently: Take 4 mg by mouth as directed. Takes 4mg every Monday & Friday), Disp: 30 tablet, Rfl: 6 .  warfarin (COUMADIN) 6 MG tablet, Take 1 tablet (6 mg total) by mouth daily. Or as directed (Patient taking differently: Take 6 mg by mouth as directed. Takes 6mg daily on Tuesdays, Wendesdays, Thursdays, Saturdays, and Sundays.), Disp: 45 tablet, Rfl: 6  Past Medical History: Past Medical History  Diagnosis Date  . Hyperlipidemia   . Hypertension   . Diabetes   . Nephrolithiasis   . Ankylosing spondylitis   . CAD (coronary artery disease)     a. stenting of LCx 2013; b. STEMI 06/12/14 s/p PCI to LAD complicated by post cath shock requiring IABP; VT s/p DCCV, EF 20%; c. NSTEMI 06/26/14 treated medically.  . Ischemic cardiomyopathy     a. echo 08/23/2014 EF <20%, dilated CM, mod MR/TR  . Acute on chronic respiratory failure     a. 08/2014 in setting of PE.  . Bilateral pulmonary embolism     a. 08/2014 - started on Coumadin. Retrievable IVC filter placed 08/27/14 due to RV strain and large clot burden.  . Right leg DVT     a. 08/2014.  . Chronic systolic CHF (congestive heart failure)   . Hypotension   . Hemoptysis     a. 08/2014 possibly due to pulm infarct/PE.  . Pleural effusion on right 08/2014 - small  .   Leukocytosis   . Carotid artery disease     a. s/p stenting.  . Ventricular tachycardia     a. 06/2014 at time of MI, s/p DCCV.  . Reactive thrombocytosis 09/10/2014  . Leukocytosis 09/10/2014    Tobacco Use: History  Smoking status  . Former Smoker -- 0.30 packs/day for 0 years  . Types: Cigarettes  . Quit date: 06/12/1969  Smokeless tobacco  . 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    Labs: Recent Review Flowsheet Data    Labs for ITP Cardiac and Pulmonary Rehab Latest Ref Rng 10/12/2014 10/12/2014 10/13/2014 10/14/2014 01/12/2015   PHART 7.350 - 7.450 7.335(L) - - - 7.416   PCO2ART 35.0 - 45.0 mmHg  68.6(HH) - - - 40.4   HCO3 20.0 - 24.0 mEq/L 36.6(H) - - - 25.4(H)   TCO2 0 - 100 mmol/L 39 31 34 - 26.7   O2SAT - 99.0 - 86.9 87.0 96.7       Exercise Target Goals:    Exercise Program Goal: Individual exercise prescription set with THRR, safety & activity barriers. Participant demonstrates ability to understand and report RPE using BORG scale, to self-measure pulse accurately, and to acknowledge the importance of the exercise prescription.  Exercise Prescription Goal: Starting with aerobic activity 30 plus minutes a day, 3 days per week for initial exercise prescription. Provide home exercise prescription and guidelines that participant acknowledges understanding prior to discharge.  Activity Barriers & Risk Stratification:     Activity Barriers & Risk Stratification - 12/13/14 1405    Activity Barriers & Risk Stratification   Activity Barriers Arthritis;Back Problems;Neck/Spine Problems   Risk Stratification High      6 Minute Walk:     6 Minute Walk      12/13/14 1516 04/05/15 0929     6 Minute Walk   Phase Initial Discharge    Distance 735 feet 1150 feet    Walk Time 6 minutes 6 minutes    Resting HR 97 bpm 82 bpm    Resting BP  78/0 mmHg  Doppler    Max Ex. HR 112 bpm 136 bpm    Max Ex. BP  --  No ex. BP for LVAD    RPE 11 11    Symptoms No No       Initial Exercise Prescription:     Initial Exercise Prescription - 12/13/14 1500    Date of Initial Exercise Prescription   Date 12/13/14   Treadmill   MPH 1   Grade 0   Minutes 10   Bike   Level 0.4   Minutes 15   Recumbant Bike   Level 2   Watts 20   Minutes 15   NuStep   Level 2   Watts 30   Minutes 15   Arm Ergometer   Level 1   Watts 5   Minutes 10   Arm/Foot Ergometer   Level 1   Watts 8   Minutes 15   Cybex   Level 1   RPM 30   Minutes 15   Recumbant Elliptical   Level 1   Watts 15   Minutes 15   REL-XR   Level 2   Watts 20   Minutes 15   Prescription Details    Frequency (times per week) 3   Duration Progress to 30 minutes of continuous aerobic without signs/symptoms of physical distress   Intensity   THRR REST +    20   Ratings of Perceived Exertion 11-13   Perceived Dyspnea 2-4   Progression Continue progressive overload as per policy without signs/symptoms or physical distress.   Resistance Training   Training Prescription Yes   Weight 1   Reps 10-12      Exercise Prescription Changes:     Exercise Prescription Changes      12/16/14 0900 12/20/14 1600 01/11/15 0600 01/18/15 0900 02/07/15 1100   Exercise Review   Progression Yes Yes Yes Yes Yes   Response to Exercise   Blood Pressure (Admit)  92/0 mmHg  Doppler 88/0 mmHg  Doppler 88/0 mmHg  Doppler 88/0 mmHg  Doppler   Blood Pressure (Exit)  84/0 mmHg 88/0 mmHg  80/0 mmHg   Heart Rate (Admit)  117 bpm 100 bpm  110 bpm   Heart Rate (Exercise)  113 bpm 117 bpm  124 bpm   Heart Rate (Exit)  108 bpm 107 bpm  102 bpm   Rating of Perceived Exertion (Exercise)  13 12  13   Symptoms  No No No No   Duration  Progress to 30 minutes of continuous aerobic without signs/symptoms of physical distress Progress to 30 minutes of continuous aerobic without signs/symptoms of physical distress Progress to 30 minutes of continuous aerobic without signs/symptoms of physical distress Progress to 30 minutes of continuous aerobic without signs/symptoms of physical distress   Intensity  --  REST + 20 THRR unchanged  REST + 20 THRR unchanged  REST + 20 THRR unchanged  REST + 20   Progression  Continue progressive overload as per policy without signs/symptoms or physical distress. Continue progressive overload as per policy without signs/symptoms or physical distress. Continue progressive overload as per policy without signs/symptoms or physical distress. Continue progressive overload as per policy without signs/symptoms or physical distress.   Resistance Training   Training Prescription  Yes Yes Yes Yes    Weight  2lb 3 3 3   Reps  10-15 10-15 10-15 10-15   Interval Training   Interval Training   No No Yes   Equipment     Recumbant Elliptical  REL= BioStep   Recumbant Bike   Level 3 3 4 4 4   RPM 35 35 40 40 40   Minutes  10 15 15 15   NuStep   Level  2 2 2 2   Watts  35 35 35 35   Minutes  20 20 20 20   Recumbant Elliptical   Level   3 3 3   Watts   20 35 25   Minutes   20 20 20     02/10/15 0900 02/22/15 0900 03/01/15 1000 03/08/15 0900 03/10/15 1300   Exercise Review   Progression Yes Yes Yes Yes Yes   Response to Exercise   Blood Pressure (Admit) 88/0 mmHg  Doppler --  Doppler --  Doppler --  Doppler 74/0 mmHg  Doppler   Blood Pressure (Exit) 80/0 mmHg --   72/0 mmHg   Heart Rate (Admit) 110 bpm --   95 bpm   Heart Rate (Exercise) 124 bpm --   128 bpm   Heart Rate (Exit) 102 bpm --   82 bpm   Rating of Perceived Exertion (Exercise) 13 --   12   Symptoms No --   No   Duration Progress to 30 minutes of continuous aerobic without signs/symptoms of physical distress Progress to 30 minutes of continuous aerobic without signs/symptoms of physical distress Progress to   30 minutes of continuous aerobic without signs/symptoms of physical distress Progress to 30 minutes of continuous aerobic without signs/symptoms of physical distress Progress to 30 minutes of continuous aerobic without signs/symptoms of physical distress   Intensity THRR unchanged  REST + 20 THRR unchanged  REST + 20 THRR unchanged  REST + 20 THRR unchanged  REST + 20 THRR unchanged  REST + 20   Progression Continue progressive overload as per policy without signs/symptoms or physical distress. Continue progressive overload as per policy without signs/symptoms or physical distress. Continue progressive overload as per policy without signs/symptoms or physical distress. Continue progressive overload as per policy without signs/symptoms or physical distress. Continue progressive overload as per policy without  signs/symptoms or physical distress.   Resistance Training   Training Prescription Yes Yes Yes Yes Yes   Weight 4 4 4 4 4   Reps 10-15 10-15 10-15 10-15 10-15   Interval Training   Interval Training Yes Yes Yes Yes Yes   Equipment Recumbant Elliptical  REL= BioStep Recumbant Elliptical  REL= BioStep Recumbant Elliptical  REL= BioStep Recumbant Elliptical  REL= BioStep Recumbant Elliptical  REL= BioStep   Recumbant Bike   Level 4 6 8 8 8   RPM 40 50 50 50 50   Minutes 15 15 15 15 15   NuStep   Level 2 2 4  BioStep 4  BioStep 4  BioStep   Watts 35 35 40 40 40   Minutes 20 20 20 20 20   Recumbant Elliptical   Level 3 3 3 3 3   RPM   40 50 50   Watts 25 25 25 25 25   Minutes 20 20 20 20 20   REL-XR   Level  6 6 6 6   Watts  70 70 70 70   Minutes  15 15 15 15     03/29/15 1000 04/05/15 0900 04/11/15 1400       Exercise Review   Progression Yes Yes Yes     Response to Exercise   Blood Pressure (Admit) 74/0 mmHg  Doppler -- 86/0 mmHg     Blood Pressure (Exit) 72/0 mmHg -- 78/0 mmHg     Heart Rate (Admit) 95 bpm -- 82 bpm     Heart Rate (Exercise) 128 bpm -- 130 bpm     Heart Rate (Exit) 82 bpm -- 94 bpm     Rating of Perceived Exertion (Exercise) 12 -- 12     Symptoms No No No     Comments  Patient is approaching graduation of the program and home exercise plans were discussed. Details of the patient's exercise prescription and what they need to do in order to continue the prescription and progress with exercise were outlined and the patient verbalized understanding. The patient plans to complete all exercise at home. He does exercise twice a day at 8am and 3pm and walks and does chair aerobics.  Patient is approaching graduation of the program and home exercise plans were discussed. Details of the patient's exercise prescription and what they need to do in order to continue the prescription and progress with exercise were outlined and the patient verbalized understanding. The  patient plans to complete all exercise at home. He does exercise twice a day at 8am and 3pm and walks and does chair aerobics.      Duration Progress to 30 minutes of continuous aerobic without signs/symptoms of physical distress Progress to 30 minutes of continuous aerobic without signs/symptoms   of physical distress Progress to 30 minutes of continuous aerobic without signs/symptoms of physical distress     Intensity THRR unchanged  REST + 20 THRR unchanged  REST + 20 THRR unchanged  REST + 20     Progression Continue progressive overload as per policy without signs/symptoms or physical distress. Continue progressive overload as per policy without signs/symptoms or physical distress. Continue progressive overload as per policy without signs/symptoms or physical distress.     Resistance Training   Training Prescription Yes Yes Yes     Weight _0 Reps 10-15 10-15 10-15     Interval Training   Interval Training Yes Yes Yes     Equipment Recumbant Elliptical  REL= BioStep Recumbant Elliptical  REL= BioStep Recumbant Elliptical  REL= BioStep     Recumbant Bike   Level _1 RPM 50 50 50     Minutes _2 NuStep   Level 4  BioStep 4  BioStep 4  BioStep     Watts 40 40 40     Minutes _3 Recumbant Elliptical   Level _4 RPM 55 55 55     Watts _5 Minutes _6 REL-XR   Level _7 Watts 70 70 70     Minutes _8 Home Exercise Plan   Plans to continue exercise at  [facility]        Discharge Exercise Prescription (Final Exercise Prescription Changes):     Exercise Prescription Changes - 04/11/15 1400    Exercise Review   Progression Yes   Response to Exercise   Blood Pressure (Admit) 86/0 mmHg   Blood Pressure (Exit) 78/0 mmHg   Heart Rate (Admit) 82 bpm   Heart Rate (Exercise) 130 bpm   Heart Rate (Exit) 94 bpm   Rating of Perceived Exertion (Exercise) 12   Symptoms No   Comments Patient is approaching  graduation of the program and home exercise plans were discussed. Details of the patient's exercise prescription and what they need to do in order to continue the prescription and progress with exercise were outlined and the patient verbalized understanding. The patient plans to complete all exercise at home. He does exercise twice a day at 8am and 3pm and walks and does chair aerobics.    Duration Progress to 30 minutes of continuous aerobic without signs/symptoms of physical distress   Intensity THRR unchanged  REST + 20   Progression Continue progressive overload as per policy without signs/symptoms or physical distress.   Resistance Training   Training Prescription Yes   Weight 4   Reps 10-15   Interval Training   Interval Training Yes   Equipment Recumbant Elliptical  REL= BioStep   Recumbant Bike   Level 8   RPM 50   Minutes 15   NuStep   Level 4  BioStep   Watts 40   Minutes 20   Recumbant Elliptical   Level 4   RPM 55   Watts 30   Minutes 20   REL-XR   Level 6   Watts 70   Minutes 15   Home Exercise Plan   Plans to continue exercise at Home      Nutrition:  Target Goals: Understanding of  nutrition guidelines, daily intake of sodium <1500mg, cholesterol <200mg, calories 30% from fat and 7% or less from saturated fats, daily to have 5 or more servings of fruits and vegetables.  Biometrics:     Pre Biometrics - 12/13/14 1523    Pre Biometrics   Height 5' 8.5" (1.74 m)   Weight 186 lb 11.2 oz (84.687 kg)   Waist Circumference 40 inches   Hip Circumference 42 inches   Waist to Hip Ratio 0.95 %   BMI (Calculated) 28         Post Biometrics - 04/05/15 0954     Post  Biometrics   Height 5' 8.5" (1.74 m)   Weight 184 lb (83.462 kg)   Waist Circumference 38.75 inches   Hip Circumference 42 inches   Waist to Hip Ratio 0.92 %   BMI (Calculated) 27.6      Nutrition Therapy Plan and Nutrition Goals:     Nutrition Therapy & Goals - 01/25/15 1359     Personal Nutrition Goals   Comments --  Patient does not wish to meet with dietitian at this time.      Nutrition Discharge: Rate Your Plate Scores:     Rate Your Plate - 03/24/15 1121    Rate Your Plate Scores   Post Score 70   Post Score % 77.77 %      Nutrition Goals Re-Evaluation:     Nutrition Goals Re-Evaluation      01/20/15 1207 02/24/15 0956 03/24/15 1120 04/13/15 1212     Personal Goal #1 Re-Evaluation   Personal Goal #1 Has meet with dieticians in the past and know what to eat for his Coumadin INR PT level blood work to be ok.  "the LVAD people take care of suggesting to me what to eat. I am ok and don't need to  meet individually with your(Cardiac Rehab registered )dietician.      Goal Progress Seen  Yes Yes Yes    Comments Is eating healthy.  Still prefers not to meet with the dietician. Jane his wife said she is comfortable cooking for him.  Greene and his wfie eat healthy.        Psychosocial: Target Goals: Acknowledge presence or absence of depression, maximize coping skills, provide positive support system. Participant is able to verbalize types and ability to use techniques and skills needed for reducing stress and depression.  Initial Review & Psychosocial Screening:     Initial Psych Review & Screening - 12/13/14 1418    Initial Review   Current issues with Current Sleep Concerns   Family Dynamics   Good Support System? Yes   Comments Does have counselor with LVAD program.      Barriers   Psychosocial barriers to participate in program There are no identifiable barriers or psychosocial needs.;The patient should benefit from training in stress management and relaxation.   Screening Interventions   Interventions Encouraged to exercise      Quality of Life Scores:     Quality of Life - 03/24/15 1123    Quality of Life Scores   Health/Function Post 18.37 %   Health/Function % Change 25 %   Socioeconomic Post 29.64 %   Socioeconomic % Change 29 %    Psych/Spiritual Post 27.43 %   Psych/Spiritual % Change 18 %   Family Post 30 %   Family % Change 49 %   GLOBAL Post 24.09 %   GLOBAL % Change 28 %        PHQ-9:     Recent Review Flowsheet Data    Depression screen PHQ 2/9 03/24/2015 12/13/2014   Decreased Interest 0 2   Down, Depressed, Hopeless 0 0   PHQ - 2 Score 0 2   Altered sleeping 1 2   Tired, decreased energy 2 3   Change in appetite 0 0   Feeling bad or failure about yourself  0 0   Trouble concentrating 0 0   Moving slowly or fidgety/restless 0 3   Suicidal thoughts 0 0   PHQ-9 Score 3 10   Difficult doing work/chores Not difficult at all Very difficult      Psychosocial Evaluation and Intervention:     Psychosocial Evaluation - 03/08/15 0934    Psychosocial Evaluation & Interventions   Interventions Encouraged to exercise with the program and follow exercise prescription   Comments Counselor met with Mr. B today for psychosocial evaluation.  He is a 69 year old who had a heart attack 10 months ago resulting in an LVAD.  He has a strong support system and is actively involved in his faith community.  He reports to sleeping well and a having a good appetite.  He denies a history of depression or current symptoms, although he admits to struggling with remaining positive since having the LVAD.  He states he is typically in a positive mood and his primary stress is his health issues.  His goals for this program are to increase his stamina and strength.  He reports exercising consistently both here and at home.  Counselor mentioned the possibility of speaking with a counselor for his occasional depressive symptoms and he reported "it would be a waste of time."     Continued Psychosocial Services Needed --  Mr. B will benefit from the psychoeducational components of this program, especially on depression and stress management.        Psychosocial Re-Evaluation:     Psychosocial Re-Evaluation      04/13/15 1214            Psychosocial Re-Evaluation   Interventions Stress management education       Comments [name] has multiple MD appts and upcoming cataract surgery,          Vocational Rehabilitation: Provide vocational rehab assistance to qualifying candidates.   Vocational Rehab Evaluation & Intervention:     Vocational Rehab - 12/13/14 1407    Initial Vocational Rehab Evaluation & Intervention   Assessment shows need for Vocational Rehabilitation No      Education: Education Goals: Education classes will be provided on a weekly basis, covering required topics. Participant will state understanding/return demonstration of topics presented.  Learning Barriers/Preferences:     Learning Barriers/Preferences - 12/13/14 1406    Learning Barriers/Preferences   Learning Barriers Reading   Learning Preferences Video      Education Topics: General Nutrition Guidelines/Fats and Fiber: -Group instruction provided by verbal, written material, models and posters to present the general guidelines for heart healthy nutrition. Gives an explanation and review of dietary fats and fiber.   Controlling Sodium/Reading Food Labels: -Group verbal and written material supporting the discussion of sodium use in heart healthy nutrition. Review and explanation with models, verbal and written materials for utilization of the food label.   Exercise Physiology & Risk Factors: - Group verbal and written instruction with models to review the exercise physiology of the cardiovascular system and associated critical values. Details cardiovascular disease risk factors and the goals associated with each risk factor.     Aerobic Exercise & Resistance Training: - Gives group verbal and written discussion on the health impact of inactivity. On the components of aerobic and resistive training programs and the benefits of this training and how to safely progress through these programs.   Flexibility, Balance, General  Exercise Guidelines: - Provides group verbal and written instruction on the benefits of flexibility and balance training programs. Provides general exercise guidelines with specific guidelines to those with heart or lung disease. Demonstration and skill practice provided.   Stress Management: - Provides group verbal and written instruction about the health risks of elevated stress, cause of high stress, and healthy ways to reduce stress.   Depression: - Provides group verbal and written instruction on the correlation between heart/lung disease and depressed mood, treatment options, and the stigmas associated with seeking treatment.   Anatomy & Physiology of the Heart: - Group verbal and written instruction and models provide basic cardiac anatomy and physiology, with the coronary electrical and arterial systems. Review of: AMI, Angina, Valve disease, Heart Failure, Cardiac Arrhythmia, Pacemakers, and the ICD.   Cardiac Procedures: - Group verbal and written instruction and models to describe the testing methods done to diagnose heart disease. Reviews the outcomes of the test results. Describes the treatment choices: Medical Management, Angioplasty, or Coronary Bypass Surgery.   Cardiac Medications: - Group verbal and written instruction to review commonly prescribed medications for heart disease. Reviews the medication, class of the drug, and side effects. Includes the steps to properly store meds and maintain the prescription regimen.   Go Sex-Intimacy & Heart Disease, Get SMART - Goal Setting: - Group verbal and written instruction through game format to discuss heart disease and the return to sexual intimacy. Provides group verbal and written material to discuss and apply goal setting through the application of the S.M.A.R.T. Method.   Other Matters of the Heart: - Provides group verbal, written materials and models to describe Heart Failure, Angina, Valve Disease, and Diabetes in the  realm of heart disease. Includes description of the disease process and treatment options available to the cardiac patient.          Cardiac Rehab from 01/13/2015 in [facility]   Date  12/16/14   Educator  CE   Instruction Review Code  2- meets goals/outcomes      Exercise & Equipment Safety: - Individual verbal instruction and demonstration of equipment use and safety with use of the equipment.      Cardiac Rehab from 01/13/2015 in [facility]   Date  12/13/14   Educator  S Bice   Instruction Review Code  2- meets goals/outcomes      Infection Prevention: - Provides verbal and written material to individual with discussion of infection control including proper hand washing and proper equipment cleaning during exercise session.      Cardiac Rehab from 01/13/2015 in [facility]   Date  12/13/14   Educator  S Bice   Instruction Review Code  2- meets goals/outcomes      Falls Prevention: - Provides verbal and written material to individual with discussion of falls prevention and safety.      Cardiac Rehab from 01/13/2015 in [facility]   Date  12/13/14   Educator  S Bice   Instruction Review Code  2- meets goals/outcomes      Diabetes: - Individual verbal and written instruction to review signs/symptoms of diabetes, desired ranges of glucose level fasting, after meals and with exercise. Advice   that pre and post exercise glucose checks will be done for 3 sessions at entry of program.    Knowledge Questionnaire Score:     Knowledge Questionnaire Score - 03/24/15 1119    Knowledge Questionnaire Score   Post Score Not done since Ronen did not want to attend any Cardiac Rehab formal education. He was instructed on each piece of exercise equipment he used i.e Biostep equipment which is like a recumbent elliptical plusincluding small hand weight resistance training.       Personal Goals and Risk Factors at Admission:     Personal Goals and Risk  Factors at Admission - 12/13/14 1410    Personal Goals and Risk Factors on Admission    Weight Management No   Increase Aerobic Exercise and Physical Activity Yes   Intervention While in program, learn and follow the exercise prescription taught. Start at a low level workload and increase workload after able to maintain previous level for 30 minutes. Increase time before increasing intensity.   Develop more efficient breathing techniques such as purse lipped breathing and diaphragmatic breathing; and practicing self-pacing with activity Yes   Intervention While in program, learn and utilize the specific breathing techniques taught to you. Continue to practice and use the techniques as needed.   Diabetes No   Hypertension No   Lipids Yes   Goal Cholesterol controlled with medications as prescribed, with individualized exercise RX and with personalized nutrition plan. Value goals: LDL < 32m, HDL > 460m Participant states understanding of desired cholesterol values and following prescriptions.   Intervention Provide nutrition & aerobic exercise along with prescribed medications to achieve LDL <7031mHDL >85m58m    Personal Goals and Risk Factors Review:      Goals and Risk Factor Review      01/18/15 1504 01/20/15 1208 01/25/15 1011 02/03/15 0919 02/16/15 1207   Increase Aerobic Exercise and Physical Activity   Goals Progress/Improvement seen  _0    Comments Progressing with exercise prescription Would prefer not to attend the education sessions but only wants to exercise in Cardiac Rehab.  Climbing stairs is noticeably easier, walking in and out of class and when running errands is easier. Would still like to increase his walking endurance so he can go to the grocery store.  feels much stronger, balance has improved    Abnormal Lipids   Goal Cholesterol controlled with medications as prescribed, with individualized exercise RX and with personalized nutrition plan. Value  goals: LDL < 70mg9mL > 85mg.9mticipant states understanding of desired cholesterol values and following prescriptions.       Progress seen towards goals Unknown Yes      Comments no labs to compare       Other Goal   Goals Progress/Improvement seen    Yes Yes    Comments   Learning to breathe properly during execise and during the cool down  enjoys everything about the program      02/24/15 0957 03/14/15 1505 03/24/15 1121 04/13/15 1213     Increase Aerobic Exercise and Physical Activity   Goals Progress/Improvement seen  Yes Yes Yes Yes    Comments "I am trying to live longer especially after I went through so much when they put this LVAD in. I did it for my wife since she said she wasn't ready for me to leave her yet". "I am trying to live longer especially after I went through so much when they put this LVAD  in. I did it for my wife since she said she wasn't ready for me to leave her yet". Chee has been able to increase his exercise tolerance since being in Cardiac Rehab. It has helped him.  Marlos reports he does a 30-40 minute workout at home most days incl leg strengthing exercises that his wife helps him with at times she reports.     Abnormal Lipids   Goal  Cholesterol controlled with medications as prescribed, with individualized exercise RX and with personalized nutrition plan. Value goals: LDL < 70mg, HDL > 40mg. Participant states understanding of desired cholesterol values and following prescriptions.      Progress seen towards goals  Yes      Comments  no labs to compare         Personal Goals Discharge (Final Personal Goals and Risk Factors Review):      Goals and Risk Factor Review - 04/13/15 1213    Increase Aerobic Exercise and Physical Activity   Goals Progress/Improvement seen  Yes   Comments Josemiguel reports he does a 30-40 minute workout at home most days incl leg strengthing exercises that his wife helps him with at times she reports.        Comments: [name] has  multiple MD appts and upcoming cataract surgery,  

## 2015-04-14 ENCOUNTER — Encounter (HOSPITAL_COMMUNITY): Payer: Medicare Other

## 2015-04-14 DIAGNOSIS — Z95811 Presence of heart assist device: Secondary | ICD-10-CM | POA: Diagnosis not present

## 2015-04-14 DIAGNOSIS — I509 Heart failure, unspecified: Secondary | ICD-10-CM

## 2015-04-14 DIAGNOSIS — I252 Old myocardial infarction: Secondary | ICD-10-CM | POA: Diagnosis not present

## 2015-04-14 DIAGNOSIS — Z955 Presence of coronary angioplasty implant and graft: Secondary | ICD-10-CM | POA: Diagnosis not present

## 2015-04-14 NOTE — Progress Notes (Signed)
Discharge Summary  Patient Details  Name: Scott [name] MRN: [id number] Date of Birth: Oct 11, 1945 Referring Provider:  [name], MD   Number of Visits: 35/36 sessions since he is getting ready for his cataract surgery.  Reason for Discharge:  Patient reached a stable level of exercise. Patient independent in their exercise.  Smoking History:  History  Smoking status  . Former Smoker -- 0.30 packs/day for 0 years  . Types: Cigarettes  . Quit date: 06/12/1969  Smokeless tobacco  . Never Used    Comment: pt smoked while in [employer] 2-3 cig x 6 months.    Diagnosis:  Congestive heart failure, unspecified congestive heart failure chronicity, unspecified congestive heart failure type [facility])  ADL [facility]:   Initial Exercise Prescription:     Initial Exercise Prescription - 12/13/14 1500    Date of Initial Exercise Prescription   Date 12/13/14   Treadmill   MPH 1   Grade 0   Minutes 10   Bike   Level 0.4   Minutes 15   Recumbant Bike   Level 2   Watts 20   Minutes 15   NuStep   Level 2   Watts 30   Minutes 15   Arm Ergometer   Level 1   Watts 5   Minutes 10   Arm/Foot Ergometer   Level 1   Watts 8   Minutes 15   Cybex   Level 1   RPM 30   Minutes 15   Recumbant Elliptical   Level 1   Watts 15   Minutes 15   REL-XR   Level 2   Watts 20   Minutes 15   Prescription Details   Frequency (times per week) 3   Duration Progress to 30 minutes of continuous aerobic without signs/symptoms of physical distress   Intensity   THRR REST +  20   Ratings of Perceived Exertion 11-13   Perceived Dyspnea 2-4   Progression Continue progressive overload as per policy without signs/symptoms or physical distress.   Resistance Training   Training Prescription Yes   Weight 1   Reps 10-12      Discharge Exercise Prescription (Final Exercise Prescription Changes):     Exercise Prescription Changes - 04/11/15 1400    Exercise Review   Progression Yes    Response to Exercise   Blood Pressure (Admit) 86/0 mmHg   Blood Pressure (Exit) 78/0 mmHg   Heart Rate (Admit) 82 bpm   Heart Rate (Exercise) 130 bpm   Heart Rate (Exit) 94 bpm   Rating of Perceived Exertion (Exercise) 12   Symptoms No   Comments Patient is approaching graduation of the program and home exercise plans were discussed. Details of the patient's exercise prescription and what they need to do in order to continue the prescription and progress with exercise were outlined and the patient verbalized understanding. The patient plans to complete all exercise at home. He does exercise twice a day at 8am and 3pm and walks and does chair aerobics.    Duration Progress to 30 minutes of continuous aerobic without signs/symptoms of physical distress   Intensity THRR unchanged  REST + 20   Progression Continue progressive overload as per policy without signs/symptoms or physical distress.   Resistance Training   Training Prescription Yes   Weight 4   Reps 10-15   Interval Training   Interval Training Yes   Equipment Recumbant Elliptical  REL= BioStep   Recumbant Bike  Level 8   RPM 50   Minutes 15   NuStep   Level 4  BioStep   Watts 40   Minutes 20   Recumbant Elliptical   Level 4   RPM 55   Watts 30   Minutes 20   REL-XR   Level 6   Watts 70   Minutes 15   Home Exercise Plan   Plans to continue exercise at Home      Functional Capacity:     6 Minute Walk      12/13/14 1516 04/05/15 0929     6 Minute Walk   Phase Initial Discharge    Distance 735 feet 1150 feet    Walk Time 6 minutes 6 minutes    Resting HR 97 bpm 82 bpm    Resting BP  78/0 mmHg  Doppler    Max Ex. HR 112 bpm 136 bpm    Max Ex. BP  --  No ex. BP for LVAD    RPE 11 11    Symptoms No No       Psychological, QOL, Others - Outcomes: PHQ 2/9: Depression screen [facility] 2/9 03/24/2015 12/13/2014  Decreased Interest 0 2  Down, Depressed, Hopeless 0 0  PHQ - 2 Score 0 2  Altered sleeping 1  2  Tired, decreased energy 2 3  Change in appetite 0 0  Feeling bad or failure about yourself  0 0  Trouble concentrating 0 0  Moving slowly or fidgety/restless 0 3  Suicidal thoughts 0 0  PHQ-9 Score 3 10  Difficult doing work/chores Not difficult at all Very difficult    Quality of Life:     Quality of Life - 03/24/15 1123    Quality of Life Scores   Health/Function Post 18.37 %   Health/Function % Change 25 %   Socioeconomic Post 29.64 %   Socioeconomic % Change 29 %   Psych/Spiritual Post 27.43 %   Psych/Spiritual % Change 18 %   Family Post 30 %   Family % Change 49 %   GLOBAL Post 24.09 %   GLOBAL % Change 28 %      Personal Goals: Goals established at orientation with interventions provided to work toward goal.     Personal Goals and Risk Factors at Admission - 12/13/14 1410    Personal Goals and Risk Factors on Admission    Weight Management No   Increase Aerobic Exercise and Physical Activity Yes   Intervention While in program, learn and follow the exercise prescription taught. Start at a low level workload and increase workload after able to maintain previous level for 30 minutes. Increase time before increasing intensity.   Develop more efficient breathing techniques such as purse lipped breathing and diaphragmatic breathing; and practicing self-pacing with activity Yes   Intervention While in program, learn and utilize the specific breathing techniques taught to you. Continue to practice and use the techniques as needed.   Diabetes No   Hypertension No   Lipids Yes   Goal Cholesterol controlled with medications as prescribed, with individualized exercise RX and with personalized nutrition plan. Value goals: LDL < 70mg , HDL > 40mg . Participant states understanding of desired cholesterol values and following prescriptions.   Intervention Provide nutrition & aerobic exercise along with prescribed medications to achieve LDL 70mg , HDL >40mg .       Personal  Goals Discharge:     Goals and Risk Factor Review      01/18/15 1504 01/20/15 1208  01/25/15 1011 02/03/15 0919 02/16/15 1207   Increase Aerobic Exercise and Physical Activity   Goals Progress/Improvement seen  Yes Yes Yes Yes Yes   Comments Progressing with exercise prescription Would prefer not to attend the education sessions but only wants to exercise in Cardiac Rehab.  Climbing stairs is noticeably easier, walking in and out of class and when running errands is easier. Would still like to increase his walking endurance so he can go to the grocery store.  feels much stronger, balance has improved    Abnormal Lipids   Goal Cholesterol controlled with medications as prescribed, with individualized exercise RX and with personalized nutrition plan. Value goals: LDL < 70mg , HDL > 40mg . Participant states understanding of desired cholesterol values and following prescriptions.       Progress seen towards goals Unknown Yes      Comments no labs to compare       Other Goal   Goals Progress/Improvement seen    Yes Yes    Comments   Learning to breathe properly during execise and during the cool down  enjoys everything about the program      02/24/15 0957 03/14/15 1505 03/24/15 1121 04/13/15 1213 04/14/15 0913   Increase Aerobic Exercise and Physical Activity   Goals Progress/Improvement seen  Yes Yes Yes Yes Yes   Comments "I am trying to live longer especially after I went through so much when they put this LVAD in. I did it for my wife since she said she wasn't ready for me to leave her yet". "I am trying to live longer especially after I went through so much when they put this LVAD in. I did it for my wife since she said she wasn't ready for me to leave her yet". Trasean has been able to increase his exercise tolerance since being in Cardiac Rehab. It has helped him.  Stylianos reports he does a 30-40 minute workout at home most days incl leg strengthing exercises that his wife helps him with at times she  reports.     Abnormal Lipids   Goal  Cholesterol controlled with medications as prescribed, with individualized exercise RX and with personalized nutrition plan. Value goals: LDL < 70mg , HDL > 40mg . Participant states understanding of desired cholesterol values and following prescriptions.      Progress seen towards goals  Yes   Yes   Comments  no labs to compare         Nutrition & Weight - Outcomes:     Pre Biometrics - 12/13/14 1523    Pre Biometrics   Height 5' 8.5" (1.74 m)   Weight 186 lb 11.2 oz (84.687 kg)   Waist Circumference 40 inches   Hip Circumference 42 inches   Waist to Hip Ratio 0.95 %   BMI (Calculated) 28         Post Biometrics - 04/05/15 0954     Post  Biometrics   Height 5' 8.5" (1.74 m)   Weight 184 lb (83.462 kg)   Waist Circumference 38.75 inches   Hip Circumference 42 inches   Waist to Hip Ratio 0.92 %   BMI (Calculated) 27.6      Nutrition:     Nutrition Therapy & Goals - 01/25/15 1359    Personal Nutrition Goals   Comments --  Patient does not wish to meet with dietitian at this time.      Nutrition Discharge:     Rate Your Plate - 21/19/41 7408  Rate Your Plate Scores   Post Score 70   Post Score % 77.77 %      Education Questionnaire Score:     Knowledge Questionnaire Score - 03/24/15 1119    Knowledge Questionnaire Score   Post Score Not done since Dartanion did not want to attend any Cardiac Rehab formal education. He was instructed on each piece of exercise equipment he used i.e Biostep equipment which is like a recumbent elliptical plusincluding small hand weight resistance training.       Goals reviewed with patient; copy given to patient.

## 2015-04-14 NOTE — Patient Instructions (Signed)
Discharge Instructions  Patient Details  Name: Scott [name] MRN: [id number] Date of Birth: 04/29/1946 Referring Provider:  [name], MD   Number of Visits: 35/36 Getting ready for his upcoming cataract surgery  Reason for Discharge:  Patient reached a stable level of exercise. Patient independent in their exercise.  Smoking History:  History  Smoking status  . Former Smoker -- 0.30 packs/day for 0 years  . Types: Cigarettes  . Quit date: 06/12/1969  Smokeless tobacco  . Never Used    Comment: pt smoked while in [employer] 2-3 cig x 6 months.    Diagnosis:  Congestive heart failure, unspecified congestive heart failure chronicity, unspecified congestive heart failure type [facility])  Initial Exercise Prescription:     Initial Exercise Prescription - 12/13/14 1500    Date of Initial Exercise Prescription   Date 12/13/14   Treadmill   MPH 1   Grade 0   Minutes 10   Bike   Level 0.4   Minutes 15   Recumbant Bike   Level 2   Watts 20   Minutes 15   NuStep   Level 2   Watts 30   Minutes 15   Arm Ergometer   Level 1   Watts 5   Minutes 10   Arm/Foot Ergometer   Level 1   Watts 8   Minutes 15   Cybex   Level 1   RPM 30   Minutes 15   Recumbant Elliptical   Level 1   Watts 15   Minutes 15   REL-XR   Level 2   Watts 20   Minutes 15   Prescription Details   Frequency (times per week) 3   Duration Progress to 30 minutes of continuous aerobic without signs/symptoms of physical distress   Intensity   THRR REST +  20   Ratings of Perceived Exertion 11-13   Perceived Dyspnea 2-4   Progression Continue progressive overload as per policy without signs/symptoms or physical distress.   Resistance Training   Training Prescription Yes   Weight 1   Reps 10-12      Discharge Exercise Prescription (Final Exercise Prescription Changes):     Exercise Prescription Changes - 04/11/15 1400    Exercise Review   Progression Yes   Response to  Exercise   Blood Pressure (Admit) 86/0 mmHg   Blood Pressure (Exit) 78/0 mmHg   Heart Rate (Admit) 82 bpm   Heart Rate (Exercise) 130 bpm   Heart Rate (Exit) 94 bpm   Rating of Perceived Exertion (Exercise) 12   Symptoms No   Comments Patient is approaching graduation of the program and home exercise plans were discussed. Details of the patient's exercise prescription and what they need to do in order to continue the prescription and progress with exercise were outlined and the patient verbalized understanding. The patient plans to complete all exercise at home. He does exercise twice a day at 8am and 3pm and walks and does chair aerobics.    Duration Progress to 30 minutes of continuous aerobic without signs/symptoms of physical distress   Intensity THRR unchanged  REST + 20   Progression Continue progressive overload as per policy without signs/symptoms or physical distress.   Resistance Training   Training Prescription Yes   Weight 4   Reps 10-15   Interval Training   Interval Training Yes   Equipment Recumbant Elliptical  REL= BioStep   Recumbant Bike   Level 8   RPM 50  Minutes 15   NuStep   Level 4  BioStep   Watts 40   Minutes 20   Recumbant Elliptical   Level 4   RPM 55   Watts 30   Minutes 20   REL-XR   Level 6   Watts 70   Minutes 15   Home Exercise Plan   Plans to continue exercise at [place]:     6 Minute Walk      12/13/14 1516 04/05/15 0929     6 Minute Walk   Phase Initial Discharge    Distance 735 feet 1150 feet    Walk Time 6 minutes 6 minutes    Resting HR 97 bpm 82 bpm    Resting BP  78/0 mmHg  Doppler    Max Ex. HR 112 bpm 136 bpm    Max Ex. BP  --  No ex. BP for LVAD    RPE 11 11    Symptoms No No       Quality of Life:     Quality of Life - 03/24/15 1123    Quality of Life Scores   Health/Function Post 18.37 %   Health/Function % Change 25 %   Socioeconomic Post 29.64 %   Socioeconomic % Change 29 %    Psych/Spiritual Post 27.43 %   Psych/Spiritual % Change 18 %   Family Post 30 %   Family % Change 49 %   GLOBAL Post 24.09 %   GLOBAL % Change 28 %      Personal Goals: Goals established at orientation with interventions provided to work toward goal.     Personal Goals and Risk Factors at Admission - 12/13/14 1410    Personal Goals and Risk Factors on Admission    Weight Management No   Increase Aerobic Exercise and Physical Activity Yes   Intervention While in program, learn and follow the exercise prescription taught. Start at a low level workload and increase workload after able to maintain previous level for 30 minutes. Increase time before increasing intensity.   Develop more efficient breathing techniques such as purse lipped breathing and diaphragmatic breathing; and practicing self-pacing with activity Yes   Intervention While in program, learn and utilize the specific breathing techniques taught to you. Continue to practice and use the techniques as needed.   Diabetes No   Hypertension No   Lipids Yes   Goal Cholesterol controlled with medications as prescribed, with individualized exercise RX and with personalized nutrition plan. Value goals: LDL < 70mg , HDL > 40mg . Participant states understanding of desired cholesterol values and following prescriptions.   Intervention Provide nutrition & aerobic exercise along with prescribed medications to achieve LDL 70mg , HDL >40mg .       Personal Goals Discharge:     Goals and Risk Factor Review - 04/14/15 0913    Increase Aerobic Exercise and Physical Activity   Goals Progress/Improvement seen  Yes   Abnormal Lipids   Progress seen towards goals Yes      Nutrition & Weight - Outcomes:     Pre Biometrics - 12/13/14 1523    Pre Biometrics   Height 5' 8.5" (1.74 m)   Weight 186 lb 11.2 oz (84.687 kg)   Waist Circumference 40 inches   Hip Circumference 42 inches   Waist to Hip Ratio 0.95 %   BMI (Calculated) 28          Post Biometrics - 04/05/15 2119  Post  Biometrics   Height 5' 8.5" (1.74 m)   Weight 184 lb (83.462 kg)   Waist Circumference 38.75 inches   Hip Circumference 42 inches   Waist to Hip Ratio 0.92 %   BMI (Calculated) 27.6      Nutrition:     Nutrition Therapy & Goals - 01/25/15 1359    Personal Nutrition Goals   Comments --  Patient does not wish to meet with dietitian at this time.      Nutrition Discharge:     Rate Your Plate - 07/10/30 3557    Rate Your Plate Scores   Post Score 70   Post Score % 77.77 %      Education Questionnaire Score:     Knowledge Questionnaire Score - 03/24/15 1119    Knowledge Questionnaire Score   Post Score Not done since Deontra did not want to attend any Cardiac Rehab formal education. He was instructed on each piece of exercise equipment he used i.e Biostep equipment which is like a recumbent elliptical plusincluding small hand weight resistance training.       Goals reviewed with patient; copy given to patient.

## 2015-04-14 NOTE — Progress Notes (Signed)
Daily Session Note  Patient Details  Name: Scott [name] MRN: [id number] Date of Birth: [birth year] Referring Provider:  [name], MD  Encounter Date: 04/14/2015  Check In:     Session Check In - 04/14/15 0911    Check-In   Staff Present Gerlene Burdock RN, BSN;Steven Way BS, ACSM EP-C, Exercise Physiologist   ER physicians immediately available to respond to emergencies See telemetry face sheet for immediately available ER MD   Medication changes reported     No   Fall or balance concerns reported    No   Warm-up and Cool-down Performed on first and last piece of equipment   VAD Patient? Yes   VAD patient   Has back up controller? Yes   Has spare charged batteries? Yes   Has battery cables? Yes   Has compatible battery clips? Yes   Pain Assessment   Currently in Pain? No/denies         Goals Met:  Proper associated with RPD/PD & O2 Sat Exercise tolerated well  Goals Unmet:  Not Applicable  Goals Comments: Ready for discharge so he can get ready for his upcoming cataract surgery. He is going to finish today 35/36 sessions and [name] requested to make this his last day.    Dr. Emily Filbert is Medical Director for [facility].

## 2015-04-14 NOTE — Progress Notes (Signed)
Cardiac Individual Treatment Plan  Patient Details  Name: Scott [name] MRN: [id number] Date of Birth: [birth year] Referring Provider:  [name], MD  Initial Encounter Date:    Visit Diagnosis: Congestive heart failure, unspecified congestive heart failure chronicity, unspecified congestive heart failure type ([place])  Patient's Home Medications on Admission:  Current outpatient prescriptions:  .  acetaminophen (TYLENOL) 500 MG tablet, Take 1,000 mg by mouth every 6 (six) hours as needed for moderate pain. , Disp: , Rfl:  .  cephALEXin (KEFLEX) 500 MG capsule, Take 500 mg by mouth 2 (two) times daily. For 10 days for contact dermatitis, Disp: , Rfl:  .  clobetasol cream (TEMOVATE) 8.33 %, Apply 1 application topically 2 (two) times daily. Apply to affected area twice daily, Disp: , Rfl:  .  furosemide (LASIX) 20 MG tablet, Every other day alternate 20 mg (1 pill) with 40 mg (2 pills), Disp: 60 tablet, Rfl: 6 .  meclizine (ANTIVERT) 25 MG tablet, Take 25 mg by mouth 3 (three) times daily as needed (for headache)., Disp: , Rfl:  .  metaxalone (SKELAXIN) 800 MG tablet, Take 1 tablet (800 mg total) by mouth daily as needed for muscle spasms., Disp: 60 tablet, Rfl: 6 .  pantoprazole (PROTONIX) 40 MG tablet, Take 1 tablet (40 mg total) by mouth at bedtime., Disp: 30 tablet, Rfl: 6 .  potassium chloride SA (K-DUR,KLOR-CON) 20 MEQ tablet, Take 1 tablet (20 mEq total) by mouth as directed. Take 1 pill with 20 mg Lasix daily and 2 pills with 40 mg Lasix daily, Disp: 60 tablet, Rfl: 6 .  rosuvastatin (CRESTOR) 10 MG tablet, Take 10 mg by mouth at bedtime. , Disp: , Rfl:  .  traZODone (DESYREL) 100 MG tablet, Take 1 tablet (100 mg total) by mouth at bedtime., Disp: 30 tablet, Rfl: 6 .  warfarin (COUMADIN) 4 MG tablet, Take 1 tablet (4 mg total) by mouth daily at 6 PM. Take 1 pill every day except 1/2 pill Wednesday and Fridays. Or otherwise prescribed. (Patient taking differently: Take 4 mg  by mouth as directed. Takes 11m every Monday & Friday), Disp: 30 tablet, Rfl: 6 .  warfarin (COUMADIN) 6 MG tablet, Take 1 tablet (6 mg total) by mouth daily. Or as directed (Patient taking differently: Take 6 mg by mouth as directed. Takes 611mdaily on Tuesdays, Wendesdays, Thursdays, Saturdays, and Sundays.), Disp: 45 tablet, Rfl: 6  Past Medical History: Past Medical History  Diagnosis Date  . Hyperlipidemia   . Hypertension   . Diabetes   . Nephrolithiasis   . Ankylosing spondylitis   . CAD (coronary artery disease)     a. stenting of LCx 2013; b. STEMI 06/12/14 s/p PCI to LAD complicated by post cath shock requiring IABP; VT s/p DCCV, EF 20%; c. NSTEMI 06/26/14 treated medically.  . Ischemic cardiomyopathy     a. echo 08/23/2014 EF <20%, dilated CM, mod MR/TR  . Acute on chronic respiratory failure     a. 08/2014 in setting of PE.  . Bilateral pulmonary embolism     a. 08/2014 - started on Coumadin. Retrievable IVC filter placed 08/27/14 due to RV strain and large clot burden.  . Right leg DVT     a. 08/2014.  . Marland Kitchenhronic systolic CHF (congestive heart failure)   . Hypotension   . Hemoptysis     a. 08/2014 possibly due to pulm infarct/PE.  . Marland Kitchenleural effusion on right 08/2014 - small  . Leukocytosis   . Carotid artery  disease     a. s/p stenting.  . Ventricular tachycardia     a. 06/2014 at time of MI, s/p DCCV.  Marland Kitchen Reactive thrombocytosis 09/10/2014  . Leukocytosis 09/10/2014    Tobacco Use: History  Smoking status  . Former Smoker -- 0.30 packs/day for 0 years  . Types: Cigarettes  . Quit date: 06/12/1969  Smokeless tobacco  . Never Used    Comment: pt smoked while in [employer] 2-3 cig x 6 months.    Labs: Recent Review Flowsheet Data    Labs for ITP Cardiac and Pulmonary Rehab Latest Ref Rng 10/12/2014 10/12/2014 10/13/2014 10/14/2014 01/12/2015   PHART 7.350 - 7.450 7.335(L) - - - 7.416   PCO2ART 35.0 - 45.0 mmHg 68.6(HH) - - - 40.4   HCO3 20.0 - 24.0 mEq/L 36.6(H) - - -  25.4(H)   TCO2 0 - 100 mmol/L 39 31 34 - 26.7   O2SAT - 99.0 - 86.9 87.0 96.7       Exercise Target Goals:    Exercise Program Goal: Individual exercise prescription set with THRR, safety & activity barriers. Participant demonstrates ability to understand and report RPE using BORG scale, to self-measure pulse accurately, and to acknowledge the importance of the exercise prescription.  Exercise Prescription Goal: Starting with aerobic activity 30 plus minutes a day, 3 days per week for initial exercise prescription. Provide home exercise prescription and guidelines that participant acknowledges understanding prior to discharge.  Activity Barriers & Risk Stratification:     Activity Barriers & Risk Stratification - 04/14/15 0911    Activity Barriers & Risk Stratification   Activity Barriers Arthritis;Neck/Spine Problems   Risk Stratification High      6 Minute Walk:     6 Minute Walk      12/13/14 1516 04/05/15 0929     6 Minute Walk   Phase Initial Discharge    Distance 735 feet 1150 feet    Walk Time 6 minutes 6 minutes    Resting HR 97 bpm 82 bpm    Resting BP  78/0 mmHg  Doppler    Max Ex. HR 112 bpm 136 bpm    Max Ex. BP  --  No ex. BP for LVAD    RPE 11 11    Symptoms No No       Initial Exercise Prescription:     Initial Exercise Prescription - 12/13/14 1500    Date of Initial Exercise Prescription   Date 12/13/14   Treadmill   MPH 1   Grade 0   Minutes 10   Bike   Level 0.4   Minutes 15   Recumbant Bike   Level 2   Watts 20   Minutes 15   NuStep   Level 2   Watts 30   Minutes 15   Arm Ergometer   Level 1   Watts 5   Minutes 10   Arm/Foot Ergometer   Level 1   Watts 8   Minutes 15   Cybex   Level 1   RPM 30   Minutes 15   Recumbant Elliptical   Level 1   Watts 15   Minutes 15   REL-XR   Level 2   Watts 20   Minutes 15   Prescription Details   Frequency (times per week) 3   Duration Progress to 30 minutes of continuous  aerobic without signs/symptoms of physical distress   Intensity   THRR REST +  20   Ratings of  Perceived Exertion 11-13   Perceived Dyspnea 2-4   Progression Continue progressive overload as per policy without signs/symptoms or physical distress.   Resistance Training   Training Prescription Yes   Weight 1   Reps 10-12      Exercise Prescription Changes:     Exercise Prescription Changes      12/16/14 0900 12/20/14 1600 01/11/15 0600 01/18/15 0900 02/07/15 1100   Exercise Review   Progression Yes Yes Yes Yes Yes   Response to Exercise   Blood Pressure (Admit)  92/0 mmHg  Doppler 88/0 mmHg  Doppler 88/0 mmHg  Doppler 88/0 mmHg  Doppler   Blood Pressure (Exit)  84/0 mmHg 88/0 mmHg  80/0 mmHg   Heart Rate (Admit)  117 bpm 100 bpm  110 bpm   Heart Rate (Exercise)  113 bpm 117 bpm  124 bpm   Heart Rate (Exit)  108 bpm 107 bpm  102 bpm   Rating of Perceived Exertion (Exercise)  13 12  13    Symptoms  No No No No   Duration  Progress to 30 minutes of continuous aerobic without signs/symptoms of physical distress Progress to 30 minutes of continuous aerobic without signs/symptoms of physical distress Progress to 30 minutes of continuous aerobic without signs/symptoms of physical distress Progress to 30 minutes of continuous aerobic without signs/symptoms of physical distress   Intensity  --  REST + 20 THRR unchanged  REST + 20 THRR unchanged  REST + 20 THRR unchanged  REST + 20   Progression  Continue progressive overload as per policy without signs/symptoms or physical distress. Continue progressive overload as per policy without signs/symptoms or physical distress. Continue progressive overload as per policy without signs/symptoms or physical distress. Continue progressive overload as per policy without signs/symptoms or physical distress.   Resistance Training   Training Prescription  Yes Yes Yes Yes   Weight  2lb 3 3 3    Reps  10-15 10-15 10-15 10-15   Interval Training    Interval Training   No No Yes   Equipment     Recumbant Elliptical  REL= BioStep   Recumbant Bike   Level 3 3 4 4 4    RPM 35 35 40 40 40   Minutes  10 15 15 15    NuStep   Level  2 2 2 2    Watts  35 35 35 35   Minutes  20 20 20 20    Recumbant Elliptical   Level   3 3 3    Watts   20 35 25   Minutes   20 20 20      02/10/15 0900 02/22/15 0900 03/01/15 1000 03/08/15 0900 03/10/15 1300   Exercise Review   Progression Yes Yes Yes Yes Yes   Response to Exercise   Blood Pressure (Admit) 88/0 mmHg  Doppler --  Doppler --  Doppler --  Doppler 74/0 mmHg  Doppler   Blood Pressure (Exit) 80/0 mmHg --   72/0 mmHg   Heart Rate (Admit) 110 bpm --   95 bpm   Heart Rate (Exercise) 124 bpm --   128 bpm   Heart Rate (Exit) 102 bpm --   82 bpm   Rating of Perceived Exertion (Exercise) 13 --   12   Symptoms No --   No   Duration Progress to 30 minutes of continuous aerobic without signs/symptoms of physical distress Progress to 30 minutes of continuous aerobic without signs/symptoms of physical distress Progress to 30 minutes of continuous aerobic  without signs/symptoms of physical distress Progress to 30 minutes of continuous aerobic without signs/symptoms of physical distress Progress to 30 minutes of continuous aerobic without signs/symptoms of physical distress   Intensity THRR unchanged  REST + 20 THRR unchanged  REST + 20 THRR unchanged  REST + 20 THRR unchanged  REST + 20 THRR unchanged  REST + 20   Progression Continue progressive overload as per policy without signs/symptoms or physical distress. Continue progressive overload as per policy without signs/symptoms or physical distress. Continue progressive overload as per policy without signs/symptoms or physical distress. Continue progressive overload as per policy without signs/symptoms or physical distress. Continue progressive overload as per policy without signs/symptoms or physical distress.   Resistance Training   Training Prescription  Yes Yes Yes Yes Yes   Weight 4 4 4 4 4    Reps 10-15 10-15 10-15 10-15 10-15   Interval Training   Interval Training Yes Yes Yes Yes Yes   Equipment Recumbant Elliptical  REL= BioStep Recumbant Elliptical  REL= BioStep Recumbant Elliptical  REL= BioStep Recumbant Elliptical  REL= BioStep Recumbant Elliptical  REL= BioStep   Recumbant Bike   Level 4 6 8 8 8    RPM 40 50 50 50 50   Minutes 15 15 15 15 15    NuStep   Level 2 2 4   BioStep 4  BioStep 4  BioStep   Watts 35 35 40 40 40   Minutes 20 20 20 20 20    Recumbant Elliptical   Level 3 3 3 3 3    RPM   40 50 50   Watts 25 25 25 25 25    Minutes 20 20 20 20 20    REL-XR   Level  6 6 6 6    Watts  70 70 70 70   Minutes  15 15 15 15      03/29/15 1000 04/05/15 0900 04/11/15 1400       Exercise Review   Progression Yes Yes Yes     Response to Exercise   Blood Pressure (Admit) 74/0 mmHg  Doppler -- 86/0 mmHg     Blood Pressure (Exit) 72/0 mmHg -- 78/0 mmHg     Heart Rate (Admit) 95 bpm -- 82 bpm     Heart Rate (Exercise) 128 bpm -- 130 bpm     Heart Rate (Exit) 82 bpm -- 94 bpm     Rating of Perceived Exertion (Exercise) 12 -- 12     Symptoms No No No     Comments  Patient is approaching graduation of the program and home exercise plans were discussed. Details of the patient's exercise prescription and what they need to do in order to continue the prescription and progress with exercise were outlined and the patient verbalized understanding. The patient plans to complete all exercise at home. He does exercise twice a day at 8am and 3pm and walks and does chair aerobics.  Patient is approaching graduation of the program and home exercise plans were discussed. Details of the patient's exercise prescription and what they need to do in order to continue the prescription and progress with exercise were outlined and the patient verbalized understanding. The patient plans to complete all exercise at home. He does exercise twice a day at 8am  and 3pm and walks and does chair aerobics.      Duration Progress to 30 minutes of continuous aerobic without signs/symptoms of physical distress Progress to 30 minutes of continuous aerobic without signs/symptoms of physical distress Progress to  30 minutes of continuous aerobic without signs/symptoms of physical distress     Intensity THRR unchanged  REST + 20 THRR unchanged  REST + 20 THRR unchanged  REST + 20     Progression Continue progressive overload as per policy without signs/symptoms or physical distress. Continue progressive overload as per policy without signs/symptoms or physical distress. Continue progressive overload as per policy without signs/symptoms or physical distress.     Resistance Training   Training Prescription Yes Yes Yes     Weight 4 4 4      Reps 10-15 10-15 10-15     Interval Training   Interval Training Yes Yes Yes     Equipment Recumbant Elliptical  REL= BioStep Recumbant Elliptical  REL= BioStep Recumbant Elliptical  REL= BioStep     Recumbant Bike   Level 8 8 8      RPM 50 50 50     Minutes 15 15 15      NuStep   Level 4  BioStep 4  BioStep 4  BioStep     Watts 40 40 40     Minutes 20 20 20      Recumbant Elliptical   Level 4 4 4      RPM 55 55 55     Watts 30 30 30      Minutes 20 20 20      REL-XR   Level 6 6 6      Watts 70 70 70     Minutes 15 15 15      Home Exercise Plan   Plans to [facility] exercise at  [facility]        Discharge Exercise Prescription (Final Exercise Prescription Changes):     Exercise Prescription Changes - 04/11/15 1400    Exercise Review   Progression Yes   Response to Exercise   Blood Pressure (Admit) 86/0 mmHg   Blood Pressure (Exit) 78/0 mmHg   Heart Rate (Admit) 82 bpm   Heart Rate (Exercise) 130 bpm   Heart Rate (Exit) 94 bpm   Rating of Perceived Exertion (Exercise) 12   Symptoms No   Comments Patient is approaching graduation of the program and home exercise plans were discussed. Details of the patient's  exercise prescription and what they need to do in order to continue the prescription and progress with exercise were outlined and the patient verbalized understanding. The patient plans to complete all exercise at home. He does exercise twice a day at 8am and 3pm and walks and does chair aerobics.    Duration Progress to 30 minutes of continuous aerobic without signs/symptoms of physical distress   Intensity THRR unchanged  REST + 20   Progression Continue progressive overload as per policy without signs/symptoms or physical distress.   Resistance Training   Training Prescription Yes   Weight 4   Reps 10-15   Interval Training   Interval Training Yes   Equipment Recumbant Elliptical  REL= BioStep   Recumbant Bike   Level 8   RPM 50   Minutes 15   NuStep   Level 4  BioStep   Watts 40   Minutes 20   Recumbant Elliptical   Level 4   RPM 55   Watts 30   Minutes 20   REL-XR   Level 6   Watts 70   Minutes 15   Home Exercise Plan   Plans to continue exercise at Home      Nutrition:  Target Goals: Understanding of nutrition guidelines, daily intake of  sodium <1576m, cholesterol <2040m calories 30% from fat and 7% or less from saturated fats, daily to have 5 or more servings of fruits and vegetables.  Biometrics:     Pre Biometrics - 12/13/14 1523    Pre Biometrics   Height 5' 8.5" (1.74 m)   Weight 186 lb 11.2 oz (84.687 kg)   Waist Circumference 40 inches   Hip Circumference 42 inches   Waist to Hip Ratio 0.95 %   BMI (Calculated) 28         Post Biometrics - 04/05/15 0954     Post  Biometrics   Height 5' 8.5" (1.74 m)   Weight 184 lb (83.462 kg)   Waist Circumference 38.75 inches   Hip Circumference 42 inches   Waist to Hip Ratio 0.92 %   BMI (Calculated) 27.6      Nutrition Therapy Plan and Nutrition Goals:     Nutrition Therapy & Goals - 01/25/15 1359    Personal Nutrition Goals   Comments --  Patient does not wish to meet with dietitian at this  time.      Nutrition Discharge: Rate Your Plate Scores:     Rate Your Plate - 0933/83/2911916  Rate Your Plate Scores   Post Score 70   Post Score % 77.77 %      Nutrition Goals Re-Evaluation:     Nutrition Goals Re-Evaluation      01/20/15 1207 02/24/15 0956 03/24/15 1120 04/13/15 1212 04/14/15 0912   Personal Goal #1 Re-Evaluation   Personal Goal #1 Has meet with dieticians in the past and know what to eat for his Coumadin INR PT level blood work to be ok.  "the LVAD people take care of suggesting to me what to eat. I am ok and don't need to  meet individually with your(Cardiac Rehab registered )dietician.   Still eating healthy.    Goal Progress Seen  Yes Yes Yes Yes   Comments Is eating healthy.  Still prefers not to meet with the dietician. JaOpal Sidlesis wife said she is comfortable cooking for him.  Zade and his wfie eat healthy.        Psychosocial: Target Goals: Acknowledge presence or absence of depression, maximize coping skills, provide positive support system. Participant is able to verbalize types and ability to use techniques and skills needed for reducing stress and depression.  Initial Review & Psychosocial Screening:     Initial Psych Review & Screening - 12/13/14 1418    Initial Review   Current issues with Current Sleep Concerns   Family Dynamics   Good Support System? Yes   Comments Does have counselor with LVAD program.      Barriers   Psychosocial barriers to participate in program There are no identifiable barriers or psychosocial needs.;The patient should benefit from training in stress management and relaxation.   Screening Interventions   Interventions Encouraged to exercise      Quality of Life Scores:     Quality of Life - 03/24/15 1123    Quality of Life Scores   Health/Function Post 18.37 %   Health/Function % Change 25 %   Socioeconomic Post 29.64 %   Socioeconomic % Change 29 %   Psych/Spiritual Post 27.43 %   Psych/Spiritual %  Change 18 %   Family Post 30 %   Family % Change 49 %   GLOBAL Post 24.09 %   GLOBAL % Change 28 %      PHQ-9:  Recent Review Flowsheet Data    Depression screen [facility] 2/9 03/24/2015 12/13/2014   Decreased Interest 0 2   Down, Depressed, Hopeless 0 0   PHQ - 2 Score 0 2   Altered sleeping 1 2   Tired, decreased energy 2 3   Change in appetite 0 0   Feeling bad or failure about yourself  0 0   Trouble concentrating 0 0   Moving slowly or fidgety/restless 0 3   Suicidal thoughts 0 0   PHQ-9 Score 3 10   Difficult doing work/chores Not difficult at all Very difficult      Psychosocial Evaluation and Intervention:     Psychosocial Evaluation - 03/08/15 0934    Psychosocial Evaluation & Interventions   Interventions Encouraged to exercise with the program and follow exercise prescription   Comments Counselor met with Mr. B today for psychosocial evaluation.  He is a 69 year old who had a heart attack 10 months ago resulting in an LVAD.  He has a strong support system and is actively involved in his faith community.  He reports to sleeping well and a having a good appetite.  He denies a history of depression or current symptoms, although he admits to struggling with remaining positive since having the LVAD.  He states he is typically in a positive mood and his primary stress is his health issues.  His goals for this program are to increase his stamina and strength.  He reports exercising consistently both here and at home.  Counselor mentioned the possibility of speaking with a counselor for his occasional depressive symptoms and he reported "it would be a waste of time."     Continued Psychosocial Services Needed --  Mr. B will benefit from the psychoeducational components of this program, especially on depression and stress management.        Psychosocial Re-Evaluation:     Psychosocial Re-Evaluation      04/13/15 1214 04/14/15 0913         Psychosocial Re-Evaluation    Interventions Stress management education Stress management education      Comments [name] has multiple MD appts and upcoming cataract surgery,          Vocational Rehabilitation: Provide vocational rehab assistance to qualifying candidates.   Vocational Rehab Evaluation & Intervention:     Vocational Rehab - 04/14/15 0911    Initial Vocational Rehab Evaluation & Intervention   Assessment shows need for Vocational Rehabilitation No      Education: Education Goals: Education classes will be provided on a weekly basis, covering required topics. Participant will state understanding/return demonstration of topics presented.  Learning Barriers/Preferences:     Learning Barriers/Preferences - 04/14/15 0911    Learning Barriers/Preferences   Learning Barriers None   Learning Preferences Video      Education Topics: General Nutrition Guidelines/Fats and Fiber: -Group instruction provided by verbal, written material, models and posters to present the general guidelines for heart healthy nutrition. Gives an explanation and review of dietary fats and fiber.   Controlling Sodium/Reading Food Labels: -Group verbal and written material supporting the discussion of sodium use in heart healthy nutrition. Review and explanation with models, verbal and written materials for utilization of the food label.   Exercise Physiology & Risk Factors: - Group verbal and written instruction with models to review the exercise physiology of the cardiovascular system and associated critical values. Details cardiovascular disease risk factors and the goals associated with each risk factor.   Aerobic  Exercise & Resistance Training: - Gives group verbal and written discussion on the health impact of inactivity. On the components of aerobic and resistive training programs and the benefits of this training and how to safely progress through these programs.   Flexibility, Balance, General Exercise  Guidelines: - Provides group verbal and written instruction on the benefits of flexibility and balance training programs. Provides general exercise guidelines with specific guidelines to those with heart or lung disease. Demonstration and skill practice provided.   Stress Management: - Provides group verbal and written instruction about the health risks of elevated stress, cause of high stress, and healthy ways to reduce stress.   Depression: - Provides group verbal and written instruction on the correlation between heart/lung disease and depressed mood, treatment options, and the stigmas associated with seeking treatment.   Anatomy & Physiology of the Heart: - Group verbal and written instruction and models provide basic cardiac anatomy and physiology, with the coronary electrical and arterial systems. Review of: AMI, Angina, Valve disease, Heart Failure, Cardiac Arrhythmia, Pacemakers, and the ICD.   Cardiac Procedures: - Group verbal and written instruction and models to describe the testing methods done to diagnose heart disease. Reviews the outcomes of the test results. Describes the treatment choices: Medical Management, Angioplasty, or Coronary Bypass Surgery.   Cardiac Medications: - Group verbal and written instruction to review commonly prescribed medications for heart disease. Reviews the medication, class of the drug, and side effects. Includes the steps to properly store meds and maintain the prescription regimen.   Go Sex-Intimacy & Heart Disease, Get SMART - Goal Setting: - Group verbal and written instruction through game format to discuss heart disease and the return to sexual intimacy. Provides group verbal and written material to discuss and apply goal setting through the application of the S.M.A.R.T. Method.   Other Matters of the Heart: - Provides group verbal, written materials and models to describe Heart Failure, Angina, Valve Disease, and Diabetes in the realm of  heart disease. Includes description of the disease process and treatment options available to the cardiac patient.          Cardiac Rehab from 01/13/2015 in [facility] Cardiac Rehab   Date  12/16/14   Educator  CE   Instruction Review Code  2- meets goals/outcomes      Exercise & Equipment Safety: - Individual verbal instruction and demonstration of equipment use and safety with use of the equipment.      Cardiac Rehab from 04/14/2015 in [facility] Cardiac Rehab   Date  04/14/15   Educator  C. Rhyan Wolters,RN   Instruction Review Code  2- meets goals/outcomes      Infection Prevention: - Provides verbal and written material to individual with discussion of infection control including proper hand washing and proper equipment cleaning during exercise session.      Cardiac Rehab from 01/13/2015 in [facility] Cardiac Rehab   Date  12/13/14   Educator  S Bice   Instruction Review Code  2- meets goals/outcomes      Falls Prevention: - Provides verbal and written material to individual with discussion of falls prevention and safety.      Cardiac Rehab from 01/13/2015 in [facility]   Date  12/13/14   Educator  S Bice   Instruction Review Code  2- meets goals/outcomes      Diabetes: - Individual verbal and written instruction to review signs/symptoms of diabetes, desired ranges of glucose level fasting, after meals and with exercise. Advice that  pre and post exercise glucose checks will be done for 3 sessions at entry of program.    Knowledge Questionnaire Score:     Knowledge Questionnaire Score - 03/24/15 1119    Knowledge Questionnaire Score   Post Score Not done since Feliz did not want to attend any Cardiac Rehab formal education. He was instructed on each piece of exercise equipment he used i.e Biostep equipment which is like a recumbent elliptical plusincluding small hand weight resistance training.       Personal Goals and Risk Factors at Admission:     Personal Goals and Risk  Factors at Admission - 12/13/14 1410    Personal Goals and Risk Factors on Admission    Weight Management No   Increase Aerobic Exercise and Physical Activity Yes   Intervention While in program, learn and follow the exercise prescription taught. Start at a low level workload and increase workload after able to maintain previous level for 30 minutes. Increase time before increasing intensity.   Develop more efficient breathing techniques such as purse lipped breathing and diaphragmatic breathing; and practicing self-pacing with activity Yes   Intervention While in program, learn and utilize the specific breathing techniques taught to you. Continue to practice and use the techniques as needed.   Diabetes No   Hypertension No   Lipids Yes   Goal Cholesterol controlled with medications as prescribed, with individualized exercise RX and with personalized nutrition plan. Value goals: LDL < 67m, HDL > 453m Participant states understanding of desired cholesterol values and following prescriptions.   Intervention Provide nutrition & aerobic exercise along with prescribed medications to achieve LDL <7056mHDL >87m47m    Personal Goals and Risk Factors Review:      Goals and Risk Factor Review      01/18/15 1504 01/20/15 1208 01/25/15 1011 02/03/15 0919 02/16/15 1207   Increase Aerobic Exercise and Physical Activity   Goals Progress/Improvement seen  Yes Yes Yes Yes Yes   Comments Progressing with exercise prescription Would prefer not to attend the education sessions but only wants to exercise in Cardiac Rehab.  Climbing stairs is noticeably easier, walking in and out of class and when running errands is easier. Would still like to increase his walking endurance so he can go to the grocery store.  feels much stronger, balance has improved    Abnormal Lipids   Goal Cholesterol controlled with medications as prescribed, with individualized exercise RX and with personalized nutrition plan. Value  goals: LDL < 70mg64mL > 87mg.67mticipant states understanding of desired cholesterol values and following prescriptions.       Progress seen towards goals Unknown Yes      Comments no labs to compare       Other Goal   Goals Progress/Improvement seen    Yes Yes    Comments   Learning to breathe properly during execise and during the cool down  enjoys everything about the program      02/24/15 0957 03/14/15 1505 03/24/15 1121 04/13/15 1213 04/14/15 0913   Increase Aerobic Exercise and Physical Activity   Goals Progress/Improvement seen  Yes Yes Yes Yes Yes   Comments "I am trying to live longer especially after I went through so much when they put this LVAD in. I did it for my wife since she said she wasn't ready for me to leave her yet". "I am trying to live longer especially after I went through so much when they put this LVAD in.  I did it for my wife since she said she wasn't ready for me to leave her yet". Daesean has been able to increase his exercise tolerance since being in Cardiac Rehab. It has helped him.  Kamel reports he does a 30-40 minute workout at home most days incl leg strengthing exercises that his wife helps him with at times she reports.     Abnormal Lipids   Goal  Cholesterol controlled with medications as prescribed, with individualized exercise RX and with personalized nutrition plan. Value goals: LDL < 35m, HDL > 431m Participant states understanding of desired cholesterol values and following prescriptions.      Progress seen towards goals  Yes   Yes   Comments  no labs to compare         Personal Goals Discharge (Final Personal Goals and Risk Factors Review):      Goals and Risk Factor Review - 04/14/15 0913    Increase Aerobic Exercise and Physical Activity   Goals Progress/Improvement seen  Yes   Abnormal Lipids   Progress seen towards goals Yes       Comments: Ready for discharge so he can get ready for his upcoming cataract surgery. He is going to finish  today 35/36 sessions and DaLinna Hoffequested to make this his last day.

## 2015-04-14 NOTE — Patient Instructions (Signed)
1.  No change in coumadin dose; re-check 04/20/15 per home monitor. 2.  Return to [facility] in one month.

## 2015-04-14 NOTE — Progress Notes (Addendum)
Symptom  Yes  No  Details   Angina        x Activity:   Claudication        x How far:   Syncope        x When:   Stroke        x   Orthopnea        x How many pillows:  1  PND        x How often:  BiPAP               N/A How many hrs:   Pedal edema               x   Abd fullness        x   N&V        x Good appetite   Diaphoresis        x When:   Bleeding              x    Urine color   Light - medium yellow  SOB         x  Activity: continues as before heart failure  Palpitations         x When:  ICD shock       N/A   Hospitlizaitons         x        When/where/why:  IVC filter removal 03/30/15   ED visit         x When/where/why:  Other MD         x        When/who/why: 04/08/15 Scott. Delman [name] (derm); 04/11/15 patch test   Activity    Cardiac rehab 2 x week - finishes tomorrow  Fluid    2000  Diet    Low sodium   Vital signs: HR:  70 MAP BP:  80 Automatic BP: does not correlate O2 Sat: 967% Wt:  188.4 lbs  Last wt:  191.8  lbs Home wts: 182 - 183 lbs  Ht:  5'8"   LVAD interrogation reveals:  Speed:  9200 Flow:  4.1 Power: 5.0 PI: 6.6 Alarms: None Events: 0 - 3 PI events  Fixed speed:  9200  Low speed limit: 8600  Primary Controller: Replace back up battery in 22 months. Back up controller:  Replace back up battery in 22 months.   LVAD interrogation negative for power spikes and speed drops/PI events. No alarms recorded on event log and none reported by patient/caregiver. Back up equipment is present. No adjustments were made to prescribed settings. Pt/caregiver deny any alarms or VAD equipment issues.   LVAD equipment check completed and is in good working order. Back-up equipment present. LVAD education done on emergency procedures and precautions and reviewed exit site care.    LVAD exit site:   Pt saw Scott. Delman [name] (dermatolgy) on 04/08/15 with diagnosis of contact dermatitis. Was started on Keflex 500 mg bid for 10 days. Also was given Rx for Clobetasol Propionate  0.05% ointment to be used twice daily; started today. Patch testing done on 04/11/15; pt will return today for preliminary results. He has f/u with Scott. Delman [name] scheduled 04/15/15.   Fungal rash clearing; contact dermatitis better. Exit site and surrounding skin reddened, but dry, and less itching reported by patient.  Dressing changed as per dermatology recommendations. Wife has been using sorbaview dressing without biopatch and cleaning area with saline wipes only. Avoiding chloraprep, skin  prep, and adhesive remover.  She has been changing every 1 - 2 days as necessary in order to keep dressing dry and intact. Exit site remains well healed and incorporated. The velour is fully implanted at exit site. Drive line anchor intact. Pt denies fever or chills.   Supplies given to wife for above dressing routine.     Pt has cataract removal surgery scheduled 04/26/15 and was given the following eye gtts:  Ofloxacin 0.3%, Prednisolone 1%, Ketorolac 0.5%, and Brimonidine 0.2% - all reviewed by Scott. Aundra Dubin and are safe to use.   Patient Instructions:   1.  No change in coumadin dose; re-check 04/20/15 per home monitor. 2.  Return to [facility] in one month.   Scott Girt, RN VAD Coordinator  Office: [phone] 24/7 VAD Pager: [phone]   VAD CLINIC NOTE  Patient ID: Scott [name], male   DOB: 01-26-1946, 69 y.o.   MRN: [id number]  Primary Cardiologist: Scott [name] INR : [facility] [place]  HPI: Scott [name] is a 69 y/o with h/o CAD with ankylosing spondylitis, DM2, carotid stenting, severe ischemic CM s/p anterior STEM with VT arrest in 12/15, large PE with pulmonary infarct in 2/15 s/p IVC filter and systolic HF with EF 60%.  He underwent HM II LVAD placement on 09/20/13. He had a prolonged hospital course due to recurrent chest bleeding, severe CO2 retention and RV failure.  Follow-up Presents today for follow-up. He is doing well. Active though still with stiffness from ankylosing spondylitis.   Appetite is good his wife actually now has him on a diet.  Denies dyspnea. No edema. He is off midodrine without orthostatic symptoms. Completed cardiac rehab at [facility] cardiac rehab. Good MAP.  Main problem is ongoing rash around driveline site.  He was treated for fungal infection without improvement.  He has seen a dermatologist now.  Thought to have possible contact dermatitis with secondary bacterial infection.  He was given a course of Keflex with some improvement.  He was also told to use clobetasol cream which he has not yet started.  He had patch testing to look for allergy.    No problems with VAD dressing but has rash at site as above. No bleeding, fevers, chills, melena, dark urine. Performing self test every day. Wife doing dressing changes.   Labs (7/16): K 4.7, creatinine 1.02, LDH 301, INR 1.97, HCT 38.2 Labs (10/16): HCT 42.8   LVAD interrogation reveals:  See LVAD nurse's note above.   SH:  Social History   Social History  . Marital Status: Married    Spouse Name: N/A  . Number of Children: N/A  . Years of Education: N/A   Occupational History  . Not on file.   Social History Main Topics  . Smoking status: Former Smoker -- 0.30 packs/day for 0 years    Types: Cigarettes    Quit date: 06/12/1969  . 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  . Alcohol Use: No  . Drug Use: No  . Sexual Activity: Not Currently   Other Topics Concern  . Not on file   Social History Narrative    FH:  Family History  Problem Relation Age of Onset  . Hypertension Father   . Heart attack Father   . Heart Problems Sister     Past Medical History  Diagnosis Date  . Hyperlipidemia   . Hypertension   . Diabetes   . Nephrolithiasis   .  Ankylosing spondylitis   . CAD (coronary artery disease)     a. stenting of LCx 2013; b. STEMI 06/12/14 s/p PCI to LAD complicated by post cath shock requiring IABP; VT s/p DCCV, EF 20%; c.  NSTEMI 06/26/14 treated medically.  . Ischemic cardiomyopathy     a. echo 08/23/2014 EF <20%, dilated CM, mod MR/TR  . Acute on chronic respiratory failure     a. 08/2014 in setting of PE.  . Bilateral pulmonary embolism     a. 08/2014 - started on Coumadin. Retrievable IVC filter placed 08/27/14 due to RV strain and large clot burden.  . Right leg DVT     a. 08/2014.  Marland Kitchen Chronic systolic CHF (congestive heart failure)   . Hypotension   . Hemoptysis     a. 08/2014 possibly due to pulm infarct/PE.  Marland Kitchen Pleural effusion on right 08/2014 - small  . Leukocytosis   . Carotid artery disease     a. s/p stenting.  . Ventricular tachycardia     a. 06/2014 at time of MI, s/p DCCV.  Marland Kitchen Reactive thrombocytosis 09/10/2014  . Leukocytosis 09/10/2014    Current Outpatient Prescriptions  Medication Sig Dispense Refill  . acetaminophen (TYLENOL) 500 MG tablet Take 1,000 mg by mouth every 6 (six) hours as needed for moderate pain.     . cephALEXin (KEFLEX) 500 MG capsule Take 500 mg by mouth 2 (two) times daily. For 10 days for contact dermatitis    . clobetasol cream (TEMOVATE) 9.16 % Apply 1 application topically 2 (two) times daily. Apply to affected area twice daily    . furosemide (LASIX) 20 MG tablet Every other day alternate 20 mg (1 pill) with 40 mg (2 pills) 60 tablet 6  . meclizine (ANTIVERT) 25 MG tablet Take 25 mg by mouth 3 (three) times daily as needed (for headache).    . metaxalone (SKELAXIN) 800 MG tablet Take 1 tablet (800 mg total) by mouth daily as needed for muscle spasms. 60 tablet 6  . pantoprazole (PROTONIX) 40 MG tablet Take 1 tablet (40 mg total) by mouth at bedtime. 30 tablet 6  . potassium chloride SA (K-DUR,KLOR-CON) 20 MEQ tablet Take 1 tablet (20 mEq total) by mouth as directed. Take 1 pill with 20 mg Lasix daily and 2 pills with 40 mg Lasix daily 60 tablet 6  . rosuvastatin (CRESTOR) 10 MG tablet Take 10 mg by mouth at bedtime.     . traZODone (DESYREL) 100 MG tablet Take 1 tablet  (100 mg total) by mouth at bedtime. 30 tablet 6  . warfarin (COUMADIN) 4 MG tablet Take 1 tablet (4 mg total) by mouth daily at 6 PM. Take 1 pill every day except 1/2 pill Wednesday and Fridays. Or otherwise prescribed. (Patient taking differently: Take 4 mg by mouth as directed. Takes 4mg  every Monday & Friday) 30 tablet 6  . warfarin (COUMADIN) 6 MG tablet Take 1 tablet (6 mg total) by mouth daily. Or as directed (Patient taking differently: Take 6 mg by mouth as directed. Takes 6mg  daily on Tuesdays, Wendesdays, Thursdays, Saturdays, and Sundays.) 45 tablet 6   No current facility-administered medications for this encounter.     Vital signs: BP 80/0 mmHg  Pulse 70  Ht 5\' 8"  (1.727 m)  Wt 188 lb 6.4 oz (85.458 kg)  BMI 28.65 kg/m2  SpO2 97%   PHYSICAL EXAM: General:  Well appearing.  No resp difficulty HEENT: normal Neck: limited ROM, JVP 5-6 cm. Carotids 2+ bilaterally; no  bruits. No lymphadenopathy or thryomegaly appreciated. Cor: RR + LVAD hum Lungs: clear  Abdomen: soft, nontender, nondistended. Driveline site with diffuse surrounding erythematous rash Extremities: no cyanosis, clubbing, rash. No ankle edema Neuro: alert & orientedx3, cranial nerves grossly intact. Moves all 4 extremities w/o difficulty. Affect pleasant.  ASSESSMENT & PLAN: 1. Chronic systolic HF: Ischemic cardiomyopathy, EF 20% s/p HMII LVAD 09/20/13.  Doing very well post VAD, NYHA class II. He does not appear volume overloaded on exam.  - Continue current Lasix.  BMET sent today.   2. CAD s/p Anterior STEMI on 06/12/14 with stenting of LAD: Stable. Continue statin 3. VAD: Interrogated personally. Parameters stable.  - Driveline site has rash. Treated for fungus without improvement.  Saw dermatology, possible contact dermatitis with secondary bacterial infection.  Improvement with Keflex but still there.  Now will start using clobetasol cream (steroid).  Had patch testing to look for allergen, and will see  dermatology again Friday.  4. Ankylosing spondylitis: Off NSAIDS. Skelaxin as needed.  5. Bilateral PE with pulmonary infarct: IVC filter removed.  On coumadin.  6. COPD/CO2 retention:  Improved. He is off bipap.   7. Anticoagulation: Continue coumadin. Goal INR 1.8-2.2 due to bleeding. Off ASA.   Hrithik Boschee,MD 04/14/2015

## 2015-04-15 DIAGNOSIS — L309 Dermatitis, unspecified: Secondary | ICD-10-CM | POA: Diagnosis not present

## 2015-04-20 ENCOUNTER — Ambulatory Visit (HOSPITAL_COMMUNITY): Payer: Medicare Other | Admitting: *Deleted

## 2015-04-20 DIAGNOSIS — Z7901 Long term (current) use of anticoagulants: Secondary | ICD-10-CM | POA: Diagnosis not present

## 2015-04-20 LAB — POCT INR: INR: 1.9

## 2015-04-26 DIAGNOSIS — H2512 Age-related nuclear cataract, left eye: Secondary | ICD-10-CM | POA: Diagnosis not present

## 2015-04-27 ENCOUNTER — Ambulatory Visit (HOSPITAL_COMMUNITY): Payer: Medicare Other | Admitting: *Deleted

## 2015-04-27 LAB — POCT INR: INR: 1.6

## 2015-04-28 DIAGNOSIS — L249 Irritant contact dermatitis, unspecified cause: Secondary | ICD-10-CM | POA: Diagnosis not present

## 2015-05-03 DIAGNOSIS — H2512 Age-related nuclear cataract, left eye: Secondary | ICD-10-CM | POA: Diagnosis not present

## 2015-05-04 ENCOUNTER — Ambulatory Visit (HOSPITAL_COMMUNITY): Payer: Medicare Other | Admitting: Infectious Diseases

## 2015-05-04 LAB — POCT INR: INR: 2.2

## 2015-05-06 ENCOUNTER — Encounter: Payer: Self-pay | Admitting: Internal Medicine

## 2015-05-11 ENCOUNTER — Ambulatory Visit (HOSPITAL_COMMUNITY): Payer: Self-pay | Admitting: *Deleted

## 2015-05-11 ENCOUNTER — Other Ambulatory Visit (HOSPITAL_COMMUNITY): Payer: Self-pay | Admitting: *Deleted

## 2015-05-11 ENCOUNTER — Ambulatory Visit (HOSPITAL_COMMUNITY)
Admission: RE | Admit: 2015-05-11 | Discharge: 2015-05-11 | Disposition: A | Discharge status: Home / Self Care | Payer: Medicare Other | Source: Ambulatory Visit | Attending: Internal Medicine | Admitting: Internal Medicine

## 2015-05-11 VITALS — BP 100/0 | HR 95 | Ht 68.0 in | Wt 191.4 lb

## 2015-05-11 DIAGNOSIS — Z95811 Presence of heart assist device: Secondary | ICD-10-CM

## 2015-05-11 DIAGNOSIS — J449 Chronic obstructive pulmonary disease, unspecified: Secondary | ICD-10-CM | POA: Diagnosis not present

## 2015-05-11 DIAGNOSIS — H25011 Cortical age-related cataract, right eye: Secondary | ICD-10-CM | POA: Diagnosis not present

## 2015-05-11 DIAGNOSIS — Z87891 Personal history of nicotine dependence: Secondary | ICD-10-CM | POA: Insufficient documentation

## 2015-05-11 DIAGNOSIS — Z7901 Long term (current) use of anticoagulants: Secondary | ICD-10-CM | POA: Diagnosis not present

## 2015-05-11 DIAGNOSIS — I5022 Chronic systolic (congestive) heart failure: Secondary | ICD-10-CM | POA: Diagnosis not present

## 2015-05-11 DIAGNOSIS — M459 Ankylosing spondylitis of unspecified sites in spine: Secondary | ICD-10-CM | POA: Diagnosis not present

## 2015-05-11 DIAGNOSIS — Z79899 Other long term (current) drug therapy: Secondary | ICD-10-CM | POA: Diagnosis not present

## 2015-05-11 DIAGNOSIS — I252 Old myocardial infarction: Secondary | ICD-10-CM | POA: Insufficient documentation

## 2015-05-11 DIAGNOSIS — E785 Hyperlipidemia, unspecified: Secondary | ICD-10-CM | POA: Diagnosis not present

## 2015-05-11 DIAGNOSIS — E119 Type 2 diabetes mellitus without complications: Secondary | ICD-10-CM | POA: Diagnosis not present

## 2015-05-11 DIAGNOSIS — I255 Ischemic cardiomyopathy: Secondary | ICD-10-CM | POA: Diagnosis not present

## 2015-05-11 DIAGNOSIS — Z8249 Family history of ischemic heart disease and other diseases of the circulatory system: Secondary | ICD-10-CM | POA: Diagnosis not present

## 2015-05-11 DIAGNOSIS — I2510 Atherosclerotic heart disease of native coronary artery without angina pectoris: Secondary | ICD-10-CM | POA: Insufficient documentation

## 2015-05-11 DIAGNOSIS — Z86711 Personal history of pulmonary embolism: Secondary | ICD-10-CM | POA: Diagnosis not present

## 2015-05-11 DIAGNOSIS — Z86718 Personal history of other venous thrombosis and embolism: Secondary | ICD-10-CM | POA: Diagnosis not present

## 2015-05-11 DIAGNOSIS — H2511 Age-related nuclear cataract, right eye: Secondary | ICD-10-CM | POA: Diagnosis not present

## 2015-05-11 DIAGNOSIS — I110 Hypertensive heart disease with heart failure: Secondary | ICD-10-CM | POA: Diagnosis not present

## 2015-05-11 LAB — BASIC METABOLIC PANEL
ANION GAP: 9 (ref 5–15)
BUN: 15 mg/dL (ref 6–20)
CO2: 25 mmol/L (ref 22–32)
Calcium: 9.6 mg/dL (ref 8.9–10.3)
Chloride: 103 mmol/L (ref 101–111)
Creatinine, Ser: 1.06 mg/dL (ref 0.61–1.24)
GFR calc Af Amer: >60 mL/min (ref >60)
GFR calc non Af Amer: >60 mL/min (ref >60)
GLUCOSE: 97 mg/dL (ref 65–99)
POTASSIUM: 4.3 mmol/L (ref 3.5–5.1)
Sodium: 137 mmol/L (ref 135–145)

## 2015-05-11 LAB — CBC
HEMATOCRIT: 42.4 % (ref 39.0–52.0)
HEMOGLOBIN: 13.7 g/dL (ref 13.0–17.0)
MCH: 29.6 pg (ref 26.0–34.0)
MCHC: 32.3 g/dL (ref 30.0–36.0)
MCV: 91.6 fL (ref 78.0–100.0)
Platelets: 192 10*3/uL (ref 150–400)
RBC: 4.63 MIL/uL (ref 4.22–5.81)
RDW: 16.5 % — ABNORMAL HIGH (ref 11.5–15.5)
WBC: 9.3 10*3/uL (ref 4.0–10.5)

## 2015-05-11 LAB — LACTATE DEHYDROGENASE: LDH: 291 U/L — ABNORMAL HIGH (ref 98–192)

## 2015-05-11 LAB — PROTIME-INR
INR: 1.87 — ABNORMAL HIGH (ref 0.00–1.49)
Prothrombin Time: 21.4 seconds — ABNORMAL HIGH (ref 11.6–15.2)

## 2015-05-11 NOTE — Patient Instructions (Signed)
1.  No change in medications. 2.  Will call you with coumadin instructions. 3.  Return to [facility] in 2 month.

## 2015-05-11 NOTE — Progress Notes (Signed)
Symptom  Yes  No  Details   Angina        x Activity:   Claudication        x How far:   Syncope        x When:   Stroke        x   Orthopnea        x How many pillows:  1  PND        x How often:  BiPAP               N/A How many hrs:   Pedal edema               x   Abd fullness        x   N&V        x Good appetite   Diaphoresis        x When:   Bleeding              x    Urine color   Light - medium yellow  SOB         x  Activity: continues as before heart failure  Palpitations         x When:  ICD shock       N/A   Hospitlizaitons         x        When/where/why:  04/25/14 left eye cataract removed [place]  ED visit         x When/where/why:  Other MD                x When/who/why:   Activity    Completed rehab; reports increased stamina  Fluid    2000  Diet    Low sodium   Vital signs: HR:  95 MAP BP:  100 Automatic BP: 102/77 (85) O2 Sat: 99 % Wt:  191.4 lbs  Last wt:  188.4 lbs Home wts: 182 - 183 lbs  Ht:  5'8"   LVAD interrogation reveals:  Speed:  9200 Flow:  5.0 Power: 5.6 PI: 6.1 Alarms: low voltage advisories Events: 0 - 3 PI events Fixed speed:  9200  Low speed limit: 8600  Primary Controller: Replace back up battery in 21 months. Back up controller:  Replace back up battery in 21 months.   LVAD interrogation negative for power spikes and speed drops/PI events. No alarms recorded on event log and none reported by patient/caregiver. Back up equipment is present. No adjustments were made to prescribed settings. Pt/caregiver deny any alarms or VAD equipment issues.   LVAD equipment check completed and is in good working order. Back-up equipment present. LVAD education done on emergency procedures and precautions and reviewed exit site care.    LVAD exit site:   Pt saw Dr. Delman Cheadle (dermatolgy) on 04/08/15 with diagnosis of contact dermatitis. Was started on Keflex 500 mg bid for 10 days. Also was given Rx for Clobetasol Propionate 0.05%  ointment to be used twice daily; started today. Patch testing done on 04/11/15; pt will return today for preliminary results. He has f/u with Dr. Delman Cheadle scheduled 04/15/15.   Contact dermatitis resolved. Sterile dressing chages performed per Dr. Maurie Boettcher recommendations using Grier Mitts dressing without biopatch and cleaning area with saline only. Wife has been avoiding chloraprep and adhesive remover and has re-introduced skin prep prior to placing dressing with no skin irritation noted. She will re-introduce Biopatch in the future. Dressing  changes using sterile technique every 1 - 3 days as necessary in order to keep dressing dry and intact. Exit site remains well healed and incorporated. The velour is fully implanted at exit site. Drive line anchor intact. Pt denies fever or chills.   Supplies given to wife for above dressing routine.      Pt had left cataract removal surgery on 04/26/15 with no adverse issues. He will have right cataract removed on 05/17/15. He will continue to use eye gtts:  Ofloxacin 0.3%, Prednisolone 1%, Ketorolac 0.5%, and Brimonidine 0.2% as instructed.    Patient Instructions:  1.  No change in medications. 2.  Will call you with coumadin instructions. 3.  Return to [facility] in 2 month.    Zada Girt, RN VAD Coordinator  Office: [phone] 24/7 VAD Pager: [phone]

## 2015-05-11 NOTE — Progress Notes (Signed)
VAD CLINIC NOTE  Patient ID: Scott [name], male   DOB: 10-Apr-1946, 69 y.o.   MRN: [id number]  Primary Cardiologist: Dr Haroldine Laws INR : [facility] [place]  HPI: Scott [name] is a 69 y/o with h/o CAD with ankylosing spondylitis, DM2, carotid stenting, severe ischemic CM s/p anterior STEM with VT arrest in 12/15, large PE with pulmonary infarct in 2/15 s/p IVC filter and systolic HF with EF 35%.  He underwent HM II LVAD placement on 09/20/13. He had a prolonged hospital course due to recurrent chest bleeding, severe CO2 retention and RV failure.  Follow-up Presents today for follow-up. He is doing well. Active though still with stiffness from ankylosing spondylitis.  Appetite is good his wife actually now has him on a diet.  Denies dyspnea. No edema. He is off midodrine without orthostatic symptoms. Saw Dr. Delman Cheadle due to driveline rash and felt to be contact dermatitis with superinfection. Responded well to clobetasol cream and abx. No using just saline wipes for dressing changes. Takes furosemide 40 alternating with 20 daily. Weight stable.     No bleeding, fevers, chills, melena, dark urine. Performing self test every day. Wife doing dressing changes.   Labs (7/16): K 4.7, creatinine 1.02, LDH 301, INR 1.97, HCT 38.2 Labs (10/16): HCT 42.8   LVAD interrogation reveals:  Speed: 9200 Flow: 5.0 Power: 5.6 PI: 6.1 Alarms: low voltage advisories Events: 0 - 3 PI events Fixed speed: 9200  Low speed limit: 8600  Primary Controller: Replace back up battery in 21 months. Back up controller: Replace back up battery in 21 months.   LVAD interrogation negative for power spikes and speed drops/PI events. No alarms recorded on event log and none reported by patient/caregiver. Back up equipment is present. No adjustments were made to prescribed settings. Pt/caregiver deny any alarms or VAD equipment issues.   SH:  Social History   Social History  . Marital Status: Married    Spouse Name:  N/A  . Number of Children: N/A  . Years of Education: N/A   Occupational History  . Not on file.   Social History Main Topics  . Smoking status: Former Smoker -- 0.30 packs/day for 0 years    Types: Cigarettes    Quit date: 06/12/1969  . 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  . Alcohol Use: No  . Drug Use: No  . Sexual Activity: Not Currently   Other Topics Concern  . Not on file   Social History Narrative    FH:  Family History  Problem Relation Age of Onset  . Hypertension Father   . Heart attack Father   . Heart Problems Sister     Past Medical History  Diagnosis Date  . Hyperlipidemia   . Hypertension   . Diabetes   . Nephrolithiasis   . Ankylosing spondylitis   . CAD (coronary artery disease)     a. stenting of LCx 2013; b. STEMI 06/12/14 s/p PCI to LAD complicated by post cath shock requiring IABP; VT s/p DCCV, EF 20%; c. NSTEMI 06/26/14 treated medically.  . Ischemic cardiomyopathy     a. echo 08/23/2014 EF <20%, dilated CM, mod MR/TR  . Acute on chronic respiratory failure     a. 08/2014 in setting of PE.  . Bilateral pulmonary embolism     a. 08/2014 - started on Coumadin. Retrievable IVC filter placed 08/27/14 due to RV strain and large clot burden.  Scott [name]  Right leg DVT     a. 08/2014.  Scott [name] Chronic systolic CHF (congestive heart failure)   . Hypotension   . Hemoptysis     a. 08/2014 possibly due to pulm infarct/PE.  Scott [name] Pleural effusion on right 08/2014 - small  . Leukocytosis   . Carotid artery disease     a. s/p stenting.  . Ventricular tachycardia     a. 06/2014 at time of MI, s/p DCCV.  Scott [name] Reactive thrombocytosis 09/10/2014  . Leukocytosis 09/10/2014    Current Outpatient Prescriptions  Medication Sig Dispense Refill  . acetaminophen (TYLENOL) 500 MG tablet Take 1,000 mg by mouth every 6 (six) hours as needed for moderate pain.     . clobetasol cream (TEMOVATE) 5.39 % Apply 1 application topically 2  (two) times daily. Apply to affected area twice daily    . furosemide (LASIX) 20 MG tablet Every other day alternate 20 mg (1 pill) with 40 mg (2 pills) 60 tablet 6  . metaxalone (SKELAXIN) 800 MG tablet Take 1 tablet (800 mg total) by mouth daily as needed for muscle spasms. 60 tablet 6  . pantoprazole (PROTONIX) 40 MG tablet Take 1 tablet (40 mg total) by mouth at bedtime. 30 tablet 6  . potassium chloride SA (K-DUR,KLOR-CON) 20 MEQ tablet Take 1 tablet (20 mEq total) by mouth as directed. Take 1 pill with 20 mg Lasix daily and 2 pills with 40 mg Lasix daily 60 tablet 6  . rosuvastatin (CRESTOR) 10 MG tablet Take 10 mg by mouth at bedtime.     . traZODone (DESYREL) 100 MG tablet Take 1 tablet (100 mg total) by mouth at bedtime. 30 tablet 6  . warfarin (COUMADIN) 6 MG tablet Take 1 tablet (6 mg total) by mouth daily. Or as directed 45 tablet 6   No current facility-administered medications for this encounter.   Vital signs: HR: 95 MAP BP: 100 Automatic BP: 102/77 (85) O2 Sat: 99 % Wt: 191.4 lbs  Last wt: 188.4 lbs Home wts: 182 - 183 lbs  Ht: 5'8"    Vital signs: BP 100/0 mmHg  Pulse 95  Ht 5\' 8"  (1.727 m)  Wt 191 lb 6.4 oz (86.818 kg)  BMI 29.11 kg/m2  SpO2 99%   PHYSICAL EXAM: General:  Well appearing.  No resp difficulty HEENT: normal Neck: limited ROM, JVP 6 cm. Carotids 2+ bilaterally; no bruits. No lymphadenopathy or thryomegaly appreciated. Cor: RR + LVAD hum Lungs: clear  Abdomen: soft, nontender, nondistended. Driveline site clear Extremities: no cyanosis, clubbing, rash. No ankle edema Neuro: alert & orientedx3, cranial nerves grossly intact. Moves all 4 extremities w/o difficulty. Affect pleasant.  ASSESSMENT & PLAN: 1. Chronic systolic HF: Ischemic cardiomyopathy, EF 20% s/p HMII LVAD 09/20/13.  Doing very well post VAD, NYHA class I. He does not appear volume overloaded on exam.  - Continue current Lasix.  BMET sent today.  VAD parameters look good.  2.  CAD s/p Anterior STEMI on 06/12/14 with stenting of LAD: Stable. Continue statin 3. VAD: Interrogated personally. Parameters stable.  - Driveline site much improved with clobetasol cream (steroid). Continue current regimen. 4. Ankylosing spondylitis: Off NSAIDS. Skelaxin as needed.  5. Bilateral PE with pulmonary infarct: IVC filter removed.  On coumadin.  6. COPD/CO2 retention:  Improved. He is off bipap.   7. Anticoagulation: Continue coumadin. Goal INR 1.8-2.2 due to bleeding. Off ASA.   Jousha Schwandt, [place],MD 05/11/2015

## 2015-05-17 DIAGNOSIS — H538 Other visual disturbances: Secondary | ICD-10-CM | POA: Diagnosis not present

## 2015-05-17 DIAGNOSIS — H2511 Age-related nuclear cataract, right eye: Secondary | ICD-10-CM | POA: Diagnosis not present

## 2015-05-17 DIAGNOSIS — M75111 Incomplete rotator cuff tear or rupture of right shoulder, not specified as traumatic: Secondary | ICD-10-CM | POA: Diagnosis not present

## 2015-05-18 ENCOUNTER — Ambulatory Visit (HOSPITAL_COMMUNITY): Payer: Medicare Other | Admitting: *Deleted

## 2015-05-18 LAB — POCT INR: INR: 2.8

## 2015-05-25 ENCOUNTER — Ambulatory Visit (HOSPITAL_COMMUNITY): Payer: Medicare Other | Admitting: *Deleted

## 2015-05-25 DIAGNOSIS — H2511 Age-related nuclear cataract, right eye: Secondary | ICD-10-CM | POA: Diagnosis not present

## 2015-05-25 DIAGNOSIS — Z7901 Long term (current) use of anticoagulants: Secondary | ICD-10-CM | POA: Diagnosis not present

## 2015-05-25 LAB — POCT INR: INR: 2.8

## 2015-06-01 ENCOUNTER — Ambulatory Visit (HOSPITAL_COMMUNITY): Payer: Medicare Other | Admitting: *Deleted

## 2015-06-01 LAB — POCT INR: INR: 2.8

## 2015-06-04 ENCOUNTER — Other Ambulatory Visit: Payer: Self-pay | Admitting: Physician Assistant

## 2015-06-06 ENCOUNTER — Other Ambulatory Visit (HOSPITAL_COMMUNITY): Payer: Self-pay | Admitting: *Deleted

## 2015-06-06 DIAGNOSIS — Z7901 Long term (current) use of anticoagulants: Secondary | ICD-10-CM

## 2015-06-06 DIAGNOSIS — Z95811 Presence of heart assist device: Secondary | ICD-10-CM

## 2015-06-06 MED ORDER — WARFARIN SODIUM 4 MG PO TABS: ORAL_TABLET | ORAL | Status: DC

## 2015-06-08 ENCOUNTER — Ambulatory Visit (HOSPITAL_COMMUNITY): Payer: Medicare Other | Admitting: *Deleted

## 2015-06-08 LAB — POCT INR: INR: 2.2

## 2015-06-15 ENCOUNTER — Ambulatory Visit (HOSPITAL_COMMUNITY): Payer: Medicare Other | Admitting: *Deleted

## 2015-06-15 ENCOUNTER — Telehealth: Payer: Self-pay | Admitting: Infectious Diseases

## 2015-06-15 LAB — POCT INR: INR: 2

## 2015-06-15 NOTE — Telephone Encounter (Signed)
Called re: report of recurrent dermatitis. Started using the CHG prep and broke out again. Reported that they will not use them again and just use the saline. Using currently the prescribed ointment from dermatologist.   Also wanted to clarify coumadin dose since INR has been trending down. [name] is unsure of what dose he takes and [name] is not home. Will call them back tomorrow to clarify and determine plan.   Janene Madeira, RN VAD Coordinator   Office: [phone] 24/7 VAD Pager: [phone]

## 2015-06-22 ENCOUNTER — Telehealth (HOSPITAL_COMMUNITY): Payer: Self-pay | Admitting: Infectious Diseases

## 2015-06-22 ENCOUNTER — Ambulatory Visit (HOSPITAL_COMMUNITY): Payer: Medicare Other | Admitting: Infectious Diseases

## 2015-06-22 DIAGNOSIS — Z7901 Long term (current) use of anticoagulants: Secondary | ICD-10-CM | POA: Diagnosis not present

## 2015-06-22 LAB — POCT INR: INR: 1.9

## 2015-06-22 NOTE — Telephone Encounter (Signed)
Call received from [name] re: [name]'s INR. 1.9 today with home POC machine. Dose was increased previous visit. Also updated about his resolved dermatitis around DL/under dressing. Will continue to abstain from CHG prep sticks and skin prep to avoid further break outs.   Will CB with further coumadin instructions after DW Erika.

## 2015-06-29 ENCOUNTER — Ambulatory Visit (HOSPITAL_COMMUNITY): Payer: Medicare Other | Admitting: Infectious Diseases

## 2015-06-29 LAB — POCT INR: INR: 1.9

## 2015-07-06 ENCOUNTER — Ambulatory Visit (HOSPITAL_COMMUNITY): Payer: Medicare Other | Admitting: *Deleted

## 2015-07-06 LAB — POCT INR: INR: 2.1

## 2015-07-12 ENCOUNTER — Other Ambulatory Visit (HOSPITAL_COMMUNITY): Payer: Self-pay | Admitting: *Deleted

## 2015-07-12 ENCOUNTER — Ambulatory Visit (HOSPITAL_COMMUNITY): Payer: Self-pay | Admitting: *Deleted

## 2015-07-12 ENCOUNTER — Encounter (HOSPITAL_COMMUNITY): Payer: Self-pay | Admitting: *Deleted

## 2015-07-12 ENCOUNTER — Ambulatory Visit (HOSPITAL_COMMUNITY)
Admission: RE | Admit: 2015-07-12 | Discharge: 2015-07-12 | Disposition: A | Discharge status: Home / Self Care | Payer: Medicare Other | Source: Ambulatory Visit | Attending: Internal Medicine | Admitting: Internal Medicine

## 2015-07-12 VITALS — BP 90/0 | HR 88 | Ht 68.0 in | Wt 192.6 lb

## 2015-07-12 DIAGNOSIS — G4700 Insomnia, unspecified: Secondary | ICD-10-CM | POA: Diagnosis not present

## 2015-07-12 DIAGNOSIS — I2510 Atherosclerotic heart disease of native coronary artery without angina pectoris: Secondary | ICD-10-CM | POA: Insufficient documentation

## 2015-07-12 DIAGNOSIS — I5022 Chronic systolic (congestive) heart failure: Secondary | ICD-10-CM | POA: Insufficient documentation

## 2015-07-12 DIAGNOSIS — Z87891 Personal history of nicotine dependence: Secondary | ICD-10-CM | POA: Insufficient documentation

## 2015-07-12 DIAGNOSIS — R0609 Other forms of dyspnea: Secondary | ICD-10-CM

## 2015-07-12 DIAGNOSIS — Z7901 Long term (current) use of anticoagulants: Secondary | ICD-10-CM | POA: Insufficient documentation

## 2015-07-12 DIAGNOSIS — I509 Heart failure, unspecified: Secondary | ICD-10-CM

## 2015-07-12 DIAGNOSIS — E872 Acidosis: Secondary | ICD-10-CM | POA: Insufficient documentation

## 2015-07-12 DIAGNOSIS — Z95811 Presence of heart assist device: Secondary | ICD-10-CM

## 2015-07-12 DIAGNOSIS — Z86711 Personal history of pulmonary embolism: Secondary | ICD-10-CM | POA: Insufficient documentation

## 2015-07-12 DIAGNOSIS — I252 Old myocardial infarction: Secondary | ICD-10-CM | POA: Insufficient documentation

## 2015-07-12 DIAGNOSIS — I255 Ischemic cardiomyopathy: Secondary | ICD-10-CM | POA: Insufficient documentation

## 2015-07-12 DIAGNOSIS — Z79899 Other long term (current) drug therapy: Secondary | ICD-10-CM | POA: Diagnosis not present

## 2015-07-12 DIAGNOSIS — I5023 Acute on chronic systolic (congestive) heart failure: Secondary | ICD-10-CM

## 2015-07-12 DIAGNOSIS — E119 Type 2 diabetes mellitus without complications: Secondary | ICD-10-CM | POA: Diagnosis not present

## 2015-07-12 DIAGNOSIS — M459 Ankylosing spondylitis of unspecified sites in spine: Secondary | ICD-10-CM | POA: Insufficient documentation

## 2015-07-12 DIAGNOSIS — I480 Paroxysmal atrial fibrillation: Secondary | ICD-10-CM

## 2015-07-12 DIAGNOSIS — J449 Chronic obstructive pulmonary disease, unspecified: Secondary | ICD-10-CM | POA: Diagnosis not present

## 2015-07-12 DIAGNOSIS — G479 Sleep disorder, unspecified: Secondary | ICD-10-CM | POA: Insufficient documentation

## 2015-07-12 DIAGNOSIS — I5041 Acute combined systolic (congestive) and diastolic (congestive) heart failure: Secondary | ICD-10-CM

## 2015-07-12 LAB — LIPID PANEL
CHOLESTEROL: 157 mg/dL (ref 0–200)
HDL: 42 mg/dL (ref >40)
LDL CALC: 102 mg/dL — AB (ref 0–99)
TRIGLYCERIDES: 66 mg/dL (ref <150)
Total CHOL/HDL Ratio: 3.7 RATIO
VLDL: 13 mg/dL (ref 0–40)

## 2015-07-12 LAB — BASIC METABOLIC PANEL
ANION GAP: 9 (ref 5–15)
BUN: 14 mg/dL (ref 6–20)
CO2: 26 mmol/L (ref 22–32)
Calcium: 9.6 mg/dL (ref 8.9–10.3)
Chloride: 105 mmol/L (ref 101–111)
Creatinine, Ser: 1 mg/dL (ref 0.61–1.24)
GFR calc Af Amer: >60 mL/min (ref >60)
GFR calc non Af Amer: >60 mL/min (ref >60)
GLUCOSE: 111 mg/dL — AB (ref 65–99)
POTASSIUM: 4.1 mmol/L (ref 3.5–5.1)
SODIUM: 140 mmol/L (ref 135–145)

## 2015-07-12 LAB — CBC
HEMATOCRIT: 42.4 % (ref 39.0–52.0)
HEMOGLOBIN: 13.9 g/dL (ref 13.0–17.0)
MCH: 30.4 pg (ref 26.0–34.0)
MCHC: 32.8 g/dL (ref 30.0–36.0)
MCV: 92.8 fL (ref 78.0–100.0)
Platelets: 192 10*3/uL (ref 150–400)
RBC: 4.57 MIL/uL (ref 4.22–5.81)
RDW: 14.5 % (ref 11.5–15.5)
WBC: 9.2 10*3/uL (ref 4.0–10.5)

## 2015-07-12 LAB — LACTATE DEHYDROGENASE: LDH: 255 U/L — ABNORMAL HIGH (ref 98–192)

## 2015-07-12 LAB — PROTIME-INR
INR: 2.35 — AB (ref 0.00–1.49)
PROTHROMBIN TIME: 25.5 s — AB (ref 11.6–15.2)

## 2015-07-12 MED ORDER — TRAZODONE HCL 100 MG PO TABS: 100.0000 mg | ORAL_TABLET | Freq: Every day | ORAL | Status: DC

## 2015-07-12 MED ORDER — ROSUVASTATIN CALCIUM 10 MG PO TABS: 10.0000 mg | ORAL_TABLET | Freq: Every day | ORAL | Status: DC

## 2015-07-12 MED ORDER — POTASSIUM CHLORIDE CRYS ER 20 MEQ PO TBCR: 20.0000 meq | EXTENDED_RELEASE_TABLET | ORAL | Status: DC

## 2015-07-12 MED ORDER — PANTOPRAZOLE SODIUM 40 MG PO TBEC: 40.0000 mg | DELAYED_RELEASE_TABLET | Freq: Every day | ORAL | Status: DC

## 2015-07-12 MED ORDER — FUROSEMIDE 20 MG PO TABS
ORAL_TABLET | ORAL | Status: DC
Start: 2015-07-12 — End: 2016-07-10

## 2015-07-12 NOTE — Progress Notes (Signed)
Symptom  Yes  No  Details   Angina        x Activity:   Claudication        x How far:   Syncope        x When: Orthostatic dizziness (maybe improving a little)  Stroke        x   Orthopnea        x How many pillows:  1  PND        x How often:  BiPAP               N/A How many hrs:   Pedal edema               x   Abd fullness        x   N&V        x Good appetite   Diaphoresis        x When:   Bleeding              x    Urine color   Light - medium yellow  SOB         x  Activity: shoveling dirt in wheel barrow  Palpitations         x When:  ICD shock       N/A   Hospitlizaitons                x When:   ED visit         x When/where/why:  Other MD         x        When/who/why: Has had cataracts removed both eyes  Activity    increasing  Fluid    No limitations (2 lites)  Diet    Low sodium   Vital signs: HR:  88 MAP BP:  90 Automatic BP:  95/62 (80) O2 Sat: 96 % Wt:  192.6 lbs  Last wt:  191.4 lbs Home wts: 182 - 184 lbs  Ht:  5'8"   LVAD interrogation reveals:  Speed:  9200 Flow:  5.0 Power: 5.5 PI: 6.8 Alarms: low voltage advisories Events: 0 - 5 PI events Fixed speed:  9200  Low speed limit: 8600  Primary Controller: Replace back up battery in 19 months. Back up controller:  Replace back up battery in 19 months.   LVAD interrogation negative for power spikes and speed drops/PI events. No alarms recorded on event log and none reported by patient/caregiver. Back up equipment is present. No adjustments were made to prescribed settings. Pt/caregiver deny any alarms or VAD equipment issues.   LVAD equipment check completed and is in good working order. Back-up equipment present. LVAD education done on emergency procedures and precautions and reviewed exit site care.    LVAD exit site:   Contact dermatitis resolved. Sterile dressing change every 3 - 4 days performed by [name] (wife) using Sorbaview dressing without biopatch and cleaning area with saline only. Wife  tried adding chloraprep as cleanser and pt developed dermatitis again, returned to normal saline only, dermatitis resolved.  Dressing changes using sterile technique every 1 - 3 days as necessary in order to keep dressing dry and intact. Exit site remains well healed and incorporated. The velour is fully implanted at exit site. Drive line anchor intact. Pt denies fever or chills.      Patient Instructions:  1.  No change in medications. 2.  Will call you with coumadin  instructions. 3.  Return to [facility] in 2 month. 4.  Bring your VAD equipment (power module with patient cable and Charity fundraiser) for annual maintenance.     Zada Girt, RN VAD Coordinator  Office: [phone] 24/7 VAD Pager: [phone]

## 2015-07-12 NOTE — Progress Notes (Signed)
Patient ID: Scott [name], male   DOB: Sep 29, 1945, 70 y.o.   MRN: [id number]   VAD CLINIC NOTE  Patient ID: Scott [name], male   DOB: Aug 11, 1945, 70 y.o.   MRN: [id number]  Primary Cardiologist: Dr Haroldine Laws INR : [facility]   HPI: Scott [name] is a 70 y/o with h/o CAD with ankylosing spondylitis, DM2, carotid stenting, severe ischemic CM s/p anterior STEM with VT arrest in 12/15, large PE with pulmonary infarct in 2/15 s/p IVC filter and systolic HF with EF [id number].  He underwent HM II LVAD placement on 09/20/13. He had a prolonged hospital course due to recurrent chest bleeding, severe CO2 retention and RV failure.  Follow-up Presents today for follow-up. He is doing well. Active though still with stiffness from ankylosing spondylitis.  Does have mild to moderate exertional dyspnea but this is unchanged. Able to do all his regular activities without too much problem. Weight stable 183-185 Takes furosemide 40 alternating with 20 daily. Would like to cut back to 20 daily.      No bleeding, fevers, chills, melena, dark urine. Performing self test every day. Wife doing dressing changes.   Labs (7/16): K 4.7, creatinine 1.02, LDH 301, INR 1.97, HCT 38.2 Labs (10/16): HCT 42.8   LVAD interrogation reveals:   Speed: 9200 Flow: 5.0 Power: 5.5 PI: 6.8 Alarms: low voltage advisories Events: 0 - 5 PI events Fixed speed: 9200  Low speed limit: 8600   LVAD interrogation negative for power spikes and speed drops/PI events. No alarms recorded on event log and none reported by patient/caregiver. Back up equipment is present. No adjustments were made to prescribed settings. Pt/caregiver deny any alarms or VAD equipment issues.   SH:  Social History   Social History  . Marital Status: Married    Spouse Name: N/A  . Number of Children: N/A  . Years of Education: N/A   Occupational History  . Not on file.   Social History Main Topics  . Smoking status: Former Smoker -- 0.30  packs/day for 0 years    Types: Cigarettes    Quit date: 06/12/1969  . 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  . Alcohol Use: No  . Drug Use: No  . Sexual Activity: Not Currently   Other Topics Concern  . Not on file   Social History Narrative    FH:  Family History  Problem Relation Age of Onset  . Hypertension Father   . Heart attack Father   . Heart Problems Sister     Past Medical History  Diagnosis Date  . Hyperlipidemia   . Hypertension   . Diabetes   . Nephrolithiasis   . Ankylosing spondylitis   . CAD (coronary artery disease)     a. stenting of LCx 2013; b. STEMI 06/12/14 s/p PCI to LAD complicated by post cath shock requiring IABP; VT s/p DCCV, EF 20%; c. NSTEMI 06/26/14 treated medically.  . Ischemic cardiomyopathy     a. echo 08/23/2014 EF <20%, dilated CM, mod MR/TR  . Acute on chronic respiratory failure     a. 08/2014 in setting of PE.  . Bilateral pulmonary embolism     a. 08/2014 - started on Coumadin. Retrievable IVC filter placed 08/27/14 due to RV strain and large clot burden.  . Right leg DVT     a. 08/2014.  Marland Kitchen Chronic systolic CHF (congestive heart failure)   . Hypotension   .  Hemoptysis     a. 08/2014 possibly due to pulm infarct/PE.  Marland Kitchen Pleural effusion on right 08/2014 - small  . Leukocytosis   . Carotid artery disease     a. s/p stenting.  . Ventricular tachycardia     a. 06/2014 at time of MI, s/p DCCV.  Marland Kitchen Reactive thrombocytosis 09/10/2014  . Leukocytosis 09/10/2014    Current Outpatient Prescriptions  Medication Sig Dispense Refill  . acetaminophen (TYLENOL) 500 MG tablet Take 1,000 mg by mouth every 6 (six) hours as needed for moderate pain.     . clobetasol cream (TEMOVATE) [id number] % Apply 1 application topically 2 (two) times daily. Apply to affected area twice daily    . furosemide (LASIX) 20 MG tablet Every other day alternate 20 mg (1 pill) with 40 mg (2 pills) 180 tablet 3  .  metaxalone (SKELAXIN) 800 MG tablet Take 1 tablet (800 mg total) by mouth daily as needed for muscle spasms. 60 tablet 6  . pantoprazole (PROTONIX) 40 MG tablet Take 1 tablet (40 mg total) by mouth at bedtime. 90 tablet 3  . potassium chloride SA (K-DUR,KLOR-CON) 20 MEQ tablet Take 1 tablet (20 mEq total) by mouth as directed. Take 1 pill with 20 mg Lasix daily and 2 pills with 40 mg Lasix daily 180 tablet 3  . rosuvastatin (CRESTOR) 10 MG tablet Take 1 tablet (10 mg total) by mouth at bedtime. 90 tablet 3  . traZODone (DESYREL) 100 MG tablet Take 1 tablet (100 mg total) by mouth at bedtime. 30 tablet 6  . warfarin (COUMADIN) 4 MG tablet Take 4 mg daily or as directed 30 tablet 11  . warfarin (COUMADIN) 6 MG tablet Take 1 tablet (6 mg total) by mouth daily. Or as directed 45 tablet 6   No current facility-administered medications for this encounter.   Vital signs: HR: 88 MAP BP: 90 Automatic BP: 95/62 (80) O2 Sat: 96 % Wt: 192.6 lbs  Last wt: 191.4 lbs Home wts: 182 - 184 lbs  Ht: 5'8"    Vital signs: BP 90/0 mmHg  Pulse 88  Ht 5\' 8"  (1.727 m)  Wt 192 lb 9.6 oz (87.363 kg)  BMI 29.29 kg/m2  SpO2 96%   PHYSICAL EXAM: General:  Well appearing.  No resp difficulty HEENT: normal Neck: limited ROM, JVP 5 cm. Carotids 2+ bilaterally; no bruits. No lymphadenopathy or thryomegaly appreciated. Cor: RRR + LVAD hum Lungs: clear  Abdomen: soft, nontender, nondistended. Driveline site clear Extremities: no cyanosis, clubbing, rash. No ankle edema Neuro: alert & orientedx3, cranial nerves grossly intact. Moves all 4 extremities w/o difficulty. Affect pleasant.  ASSESSMENT & PLAN: 1. Chronic systolic HF: Ischemic cardiomyopathy, EF 20% s/p HMII LVAD 09/20/13.  Doing very well post VAD, NYHA class II. He does not appear volume overloaded on exam.  - Ok to cut torsemide back to 20 daily. We discussed use of sliding scale for increasing weight. BMET sent today.  VAD parameters look  good.  2. CAD s/p Anterior STEMI on 06/12/14 with stenting of LAD: Stable. Continue statin. Off ASA due to bleeding.  3. VAD: Interrogated personally. Parameters stable.  - Driveline site much improved with clobetasol cream (steroid). Continue current regimen. 4. Ankylosing spondylitis: Off NSAIDS. Skelaxin as needed.  5. Bilateral PE with pulmonary infarct: IVC filter removed.  On coumadin.  6. COPD/CO2 retention:  Improved. He is off bipap.   7. Anticoagulation: Continue coumadin. Goal INR 1.8-2.2 due to bleeding. Off ASA.   Bensimhon, Jasdeep,MD 07/12/2015

## 2015-07-12 NOTE — Patient Instructions (Signed)
1.  No change in medications. 2.  Will call you with coumadin instructions. 3.  Return to [facility] in 2 month. 4. Bring your VAD equipment (power module with patient cable and Charity fundraiser) for annual maintenance.

## 2015-07-20 ENCOUNTER — Ambulatory Visit (HOSPITAL_COMMUNITY): Payer: Medicare Other | Admitting: *Deleted

## 2015-07-20 DIAGNOSIS — Z7901 Long term (current) use of anticoagulants: Secondary | ICD-10-CM | POA: Diagnosis not present

## 2015-07-20 LAB — POCT INR: INR: 2.8

## 2015-07-21 ENCOUNTER — Telehealth (HOSPITAL_COMMUNITY): Payer: Self-pay | Admitting: *Deleted

## 2015-07-21 DIAGNOSIS — M542 Cervicalgia: Secondary | ICD-10-CM | POA: Diagnosis not present

## 2015-07-21 DIAGNOSIS — M459 Ankylosing spondylitis of unspecified sites in spine: Secondary | ICD-10-CM | POA: Diagnosis not present

## 2015-07-21 DIAGNOSIS — M25512 Pain in left shoulder: Secondary | ICD-10-CM | POA: Diagnosis not present

## 2015-07-21 DIAGNOSIS — G8929 Other chronic pain: Secondary | ICD-10-CM | POA: Diagnosis not present

## 2015-07-21 NOTE — Telephone Encounter (Signed)
Wife called and left message, pt saw local MD re: shoulder pain. Pt was placed on Tramadol prn for pain.  Scott [name] reports VAD driveline "got tugged on" while pt was working outside with some some bloody drainage noted.   Called pt per Dr. Haroldine Laws and asked that they monitor site, if any redness or tenderness occurs, instructed to call VAD coordinator. Pt verbalized understanding of same.

## 2015-07-23 ENCOUNTER — Encounter: Payer: Self-pay | Admitting: Emergency Medicine

## 2015-07-23 ENCOUNTER — Telehealth (HOSPITAL_COMMUNITY): Payer: Self-pay | Admitting: *Deleted

## 2015-07-23 ENCOUNTER — Emergency Department
Admission: EM | Admit: 2015-07-23 | Discharge: 2015-07-23 | Disposition: A | Discharge status: Home / Self Care | Payer: Medicare Other | Attending: Student | Admitting: Student

## 2015-07-23 DIAGNOSIS — Z79899 Other long term (current) drug therapy: Secondary | ICD-10-CM | POA: Insufficient documentation

## 2015-07-23 DIAGNOSIS — Z95811 Presence of heart assist device: Secondary | ICD-10-CM | POA: Diagnosis not present

## 2015-07-23 DIAGNOSIS — Z87891 Personal history of nicotine dependence: Secondary | ICD-10-CM | POA: Diagnosis not present

## 2015-07-23 DIAGNOSIS — I10 Essential (primary) hypertension: Secondary | ICD-10-CM | POA: Insufficient documentation

## 2015-07-23 DIAGNOSIS — R040 Epistaxis: Secondary | ICD-10-CM | POA: Diagnosis present

## 2015-07-23 DIAGNOSIS — E119 Type 2 diabetes mellitus without complications: Secondary | ICD-10-CM | POA: Diagnosis not present

## 2015-07-23 DIAGNOSIS — Z7901 Long term (current) use of anticoagulants: Secondary | ICD-10-CM | POA: Insufficient documentation

## 2015-07-23 LAB — CBC WITH DIFFERENTIAL/PLATELET
BASOS ABS: 0.1 10*3/uL (ref 0–0.1)
BASOS PCT: 0 %
EOS PCT: 1 %
Eosinophils Absolute: 0.1 10*3/uL (ref 0–0.7)
HCT: 39.3 % — ABNORMAL LOW (ref 40.0–52.0)
Hemoglobin: 13 g/dL (ref 13.0–18.0)
Lymphocytes Relative: 6 %
Lymphs Abs: 0.8 10*3/uL — ABNORMAL LOW (ref 1.0–3.6)
MCH: 29.6 pg (ref 26.0–34.0)
MCHC: 33 g/dL (ref 32.0–36.0)
MCV: 89.7 fL (ref 80.0–100.0)
MONO ABS: 1.1 10*3/uL — AB (ref 0.2–1.0)
Monocytes Relative: 8 %
Neutro Abs: 10.9 10*3/uL — ABNORMAL HIGH (ref 1.4–6.5)
Neutrophils Relative %: 85 %
PLATELETS: 196 10*3/uL (ref 150–440)
RBC: 4.38 MIL/uL — ABNORMAL LOW (ref 4.40–5.90)
RDW: 15.3 % — AB (ref 11.5–14.5)
WBC: 12.9 10*3/uL — ABNORMAL HIGH (ref 3.8–10.6)

## 2015-07-23 LAB — PROTIME-INR
INR: 1.99
Prothrombin Time: 22.5 seconds — ABNORMAL HIGH (ref 11.4–15.0)

## 2015-07-23 LAB — APTT: APTT: 34 s (ref 24–36)

## 2015-07-23 MED ORDER — OXYMETAZOLINE HCL 0.05 % NA SOLN
2.0000 | Freq: Once | NASAL | Status: AC
  Administered 2015-07-23: 2 via NASAL

## 2015-07-23 MED ORDER — OXYMETAZOLINE HCL 0.05 % NA SOLN
NASAL | Status: AC
  Administered 2015-07-23: 2 via NASAL
  Filled 2015-07-23: qty 15

## 2015-07-23 NOTE — ED Notes (Signed)
Pressure taken with doppler at this time.

## 2015-07-23 NOTE — ED Notes (Signed)
Per Elenore Rota, who took VS for patient, patient does have some cardiac function and vitals are able to be obtained. NAD noted at this time. MD made aware of patient's vitals signs. No new orders at this time.

## 2015-07-23 NOTE — ED Notes (Signed)
MD at bedside. 2 sprays Afrin each nare given by MD, nasal clamp applied at this time.

## 2015-07-23 NOTE — ED Notes (Addendum)
Nosebleed since 6 am, has LVAD. Patient has recently used indocin due to pain.

## 2015-07-23 NOTE — ED Notes (Signed)
MD made aware of pt's BP due to LVAD. Pt refused wheelchair to the lobby at this time. No active bleeding noted from patient's nose. Pt ambulatory to the lobby at this time. Pt denies questions/concerns at this time.

## 2015-07-23 NOTE — ED Provider Notes (Addendum)
[facility] Emergency Department Provider Note  ____________________________________________  Time seen: Approximately 7:49 AM  I have reviewed the triage vital signs and the nursing notes.   HISTORY  Chief Complaint Epistaxis    HPI Scott [name] is a 70 y.o. male with ankylosing spondylitis, hypertension, hyperlipidemia, diabetes, coronary artery disease, PE s/p IVC filter on coumadin, cardiomyopathy status post LVAD placement who presents for evaluation of bilateral epistaxis which began approximately 2 hours ago, sudden onset, currently mild, no modifying factors, constant since onset. No nasal trauma. No chest pain, difficulty breathing, vomiting, diarrhea, fevers or chills. The patient has not had problems with nosebleeds since discontinuation of Indocin last year however he reports he has been taking some Indocin over the past week and a half for joint pains.   Past Medical History  Diagnosis Date  . Hyperlipidemia   . Hypertension   . Diabetes ([place])   . Nephrolithiasis   . Ankylosing spondylitis ([place])   . CAD (coronary artery disease)     a. stenting of LCx 2013; b. STEMI 06/12/14 s/p PCI to LAD complicated by post cath shock requiring IABP; VT s/p DCCV, EF 20%; c. NSTEMI 06/26/14 treated medically.  . Ischemic cardiomyopathy     a. echo 08/23/2014 EF <20%, dilated CM, mod MR/TR  . Acute on chronic respiratory failure ([place])     a. 08/2014 in setting of PE.  . Bilateral pulmonary embolism ([place])     a. 08/2014 - started on Coumadin. Retrievable IVC filter placed 08/27/14 due to RV strain and large clot burden.  . Right leg DVT ([place])     a. 08/2014.  Marland Kitchen Chronic systolic CHF (congestive heart failure) ([place])   . Hypotension   . Hemoptysis     a. 08/2014 possibly due to pulm infarct/PE.  Marland Kitchen Pleural effusion on right 08/2014 - small  . Leukocytosis   . Carotid artery disease ([place])     a. s/p stenting.  . Ventricular tachycardia ([place])     a. 06/2014 at  time of MI, s/p DCCV.  Marland Kitchen Reactive thrombocytosis 09/10/2014  . Leukocytosis 09/10/2014    Patient Active Problem List   Diagnosis Date Noted  . Fungal rash of trunk 03/17/2015  . Right femoral vein DVT ([place]) 01/10/2015  . Chronic anticoagulation 12/02/2014  . Anxiety state 10/11/2014  . Sleep disturbance 10/11/2014  . Left ventricular assist device (LVAD) complication [id number]  . LVAD (left ventricular assist device) present ([place])   . PAF (paroxysmal atrial fibrillation) ([place])   . CHF (congestive heart failure) ([place]) 09/21/2014  . Palliative care encounter 09/16/2014  . Weakness generalized 09/16/2014  . Encounter for central line placement   . Asystole ([place]) 09/10/2014  . Acute on chronic systolic CHF (congestive heart failure) ([place]) 09/10/2014  . Acute pulmonary embolism ([place]) 09/10/2014  . Acute on chronic systolic heart failure, NYHA class 3 ([place]) 09/10/2014  . Reactive thrombocytosis 09/10/2014  . Leukocytosis 09/10/2014  . Encounter for therapeutic drug monitoring 09/08/2014  . Cough with hemoptysis   . Acute respiratory failure with hypoxia ([place]) 08/27/2014  . Pleural effusion 08/27/2014  . Hypoxia   . PE (pulmonary embolism) 08/26/2014  . Pulmonary embolus ([place]) 08/26/2014  . CAD in native artery 06/22/2014  . Chronic systolic heart failure ([place]) 06/22/2014  . Abdominal pain, acute   . STEMI (ST elevation myocardial infarction) ([place]) 06/12/2014  . Cardiogenic shock ([place]) 06/12/2014  . VT (ventricular tachycardia) ([place])   . Acute pulmonary edema ([facility])   .  Acute respiratory acidosis   . Encounter for imaging study to confirm orogastric (OG) tube placement   . Respiratory failure ([place])   . ST elevation myocardial infarction involving left anterior descending (LAD) coronary artery [facility])     Past Surgical History  Procedure Laterality Date  . Left heart catheterization with coronary angiogram N/A 06/12/2014    Procedure: LEFT HEART CATHETERIZATION WITH CORONARY  ANGIOGRAM;  Surgeon: Lorretta Harp, MD;  Location: [facility] CATH LAB;  Service: Cardiovascular;  Laterality: N/A;  . Right heart catheterization N/A 09/10/2014    Procedure: RIGHT HEART CATH;  Surgeon: [name], MD;  Location: [facility] CATH LAB;  Service: Cardiovascular;  Laterality: N/A;  . Right heart catheterization N/A 09/17/2014    Procedure: RIGHT HEART CATH;  Surgeon: [name], MD;  Location: [facility] CATH LAB;  Service: Cardiovascular;  Laterality: N/A;  . Insertion of implantable left ventricular assist device N/A 09/21/2014    Procedure: INSERTION OF IMPLANTABLE LEFT VENTRICULAR ASSIST DEVICE;  Surgeon: Ivin Poot, MD;  Location: [place];  Service: Open Heart Surgery;  Laterality: N/A;  CIRC ARREST  NITRIC OXIDE  . Tee without cardioversion N/A 09/21/2014    Procedure: TRANSESOPHAGEAL ECHOCARDIOGRAM (TEE);  Surgeon: Ivin Poot, MD;  Location: [place];  Service: Open Heart Surgery;  Laterality: N/A;  . Tee without cardioversion N/A 10/05/2014    Procedure: TRANSESOPHAGEAL ECHOCARDIOGRAM (TEE);  Surgeon: Ivin Poot, MD;  Location: [place];  Service: Open Heart Surgery;  Laterality: N/A;  . Bedside evacuation of hematoma  10/05/2014    Procedure: EVACUATION OF HEMATOMA;  Surgeon: Ivin Poot, MD;  Location: [place];  Service: Open Heart Surgery;;  . Video assisted thoracoscopy (vats)/thorocotomy Left 10/06/2014    Procedure: VIDEO ASSISTED THORACOSCOPY (VATS)/THOROCOTOMY;  Surgeon: Ivin Poot, MD;  Location: [place];  Service: Open Heart Surgery;  Laterality: Left;    Current Outpatient Rx  Name  Route  Sig  Dispense  Refill  . acetaminophen (TYLENOL) 500 MG tablet   Oral   Take 1,000 mg by mouth every 6 (six) hours as needed for moderate pain.          . clobetasol cream (TEMOVATE) 0.05 %   Topical   Apply 1 application topically 2 (two) times daily. Apply to affected area twice daily         . furosemide (LASIX) 20 MG tablet      Every other day alternate 20 mg (1  pill) with 40 mg (2 pills)   180 tablet   3   . metaxalone (SKELAXIN) 800 MG tablet   Oral   Take 1 tablet (800 mg total) by mouth daily as needed for muscle spasms.   60 tablet   6   . pantoprazole (PROTONIX) 40 MG tablet   Oral   Take 1 tablet (40 mg total) by mouth at bedtime.   90 tablet   3   . potassium chloride SA (K-DUR,KLOR-CON) 20 MEQ tablet   Oral   Take 1 tablet (20 mEq total) by mouth as directed. Take 1 pill with 20 mg Lasix daily and 2 pills with 40 mg Lasix daily   180 tablet   3   . rosuvastatin (CRESTOR) 10 MG tablet   Oral   Take 1 tablet (10 mg total) by mouth at bedtime.   90 tablet   3   . traZODone (DESYREL) 100 MG tablet   Oral   Take 1 tablet (100 mg total) by  mouth at bedtime.   30 tablet   6   . warfarin (COUMADIN) 4 MG tablet      Take 4 mg daily or as directed   30 tablet   11   . warfarin (COUMADIN) 6 MG tablet   Oral   Take 1 tablet (6 mg total) by mouth daily. Or as directed   45 tablet   6     Allergies Review of patient's allergies indicates no known allergies.  Family History  Problem Relation Age of Onset  . Hypertension Father   . Heart attack Father   . Heart Problems Sister     Social History Social History  Substance Use Topics  . Smoking status: Former Smoker -- 0.30 packs/day for 0 years    Types: Cigarettes    Quit date: 06/12/1969  . 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  . Alcohol Use: No    Review of Systems Constitutional: No fever/chills Eyes: No visual changes. ENT: No sore throat. Cardiovascular: Denies chest pain. Respiratory: Denies shortness of breath. Gastrointestinal: No abdominal pain.  No nausea, no vomiting.  No diarrhea.  No constipation. Genitourinary: Negative for dysuria. Musculoskeletal: Negative for back pain. Skin: Negative for rash. Neurological: Negative for headaches, focal weakness or numbness.  10-point ROS  otherwise negative.  ____________________________________________   PHYSICAL EXAM:  VITAL SIGNS: ED Triage Vitals  Enc Vitals Group     BP --      Pulse Rate 07/23/15 0719 106     Resp 07/23/15 0719 18     Temp 07/23/15 0719 98 F (36.7 C)     Temp Source 07/23/15 0719 Oral     SpO2 07/23/15 0719 98 %     Weight --      Height 07/23/15 0719 5\' 10"  (1.778 m)     Head Cir --      Peak Flow --      Pain Score --      Pain Loc --      Pain Edu? --      Excl. in [place]? --     Constitutional: Alert and oriented. Well appearing and in no acute distress. Eyes: Conjunctivae are normal. PERRL. EOMI. Head: Atraumatic. Nose: Very small amount of blood from the nares bilaterally but mostly the mucosa is hyperemic. Mouth/Throat: Mucous membranes are moist.  Oropharynx non-erythematous. No blood in the oropharynx. Neck: No stridor.  Cardiovascular:  LVAD hum is appreciated. Respiratory: Normal respiratory effort.  No retractions. Lungs CTAB. Gastrointestinal: Soft and nontender. No distention.  No CVA tenderness. Genitourinary: deferred Musculoskeletal: No lower extremity tenderness nor edema.  No joint effusions. Neurologic:  Normal speech and language. No gross focal neurologic deficits are appreciated. No gait instability. Skin:  Skin is warm, dry and intact. No rash noted. Psychiatric: Mood and affect are normal. Speech and behavior are normal.  ____________________________________________   LABS (all labs ordered are listed, but only abnormal results are displayed)  Labs Reviewed  CBC WITH DIFFERENTIAL/PLATELET - Abnormal; Notable for the following:    WBC 12.9 (*)    RBC 4.38 (*)    HCT 39.3 (*)    RDW 15.3 (*)    Neutro Abs 10.9 (*)    Lymphs Abs 0.8 (*)    Monocytes Absolute 1.1 (*)    All other components within normal limits  PROTIME-INR - Abnormal; Notable for the following:    Prothrombin Time 22.5 (*)  All other components within normal limits  APTT    ____________________________________________  EKG  none ____________________________________________  RADIOLOGY  none ____________________________________________   PROCEDURES  Procedure(s) performed: None  Critical Care performed: No  ____________________________________________   INITIAL IMPRESSION / ASSESSMENT AND PLAN / ED COURSE  Pertinent labs & imaging results that were available during my care of the patient were reviewed by me and considered in my medical decision making (see chart for details).  Scott [name] is a 70 y.o. male with ankylosing spondylitis, hypertension, hyperlipidemia, diabetes, coronary artery disease, PE s/p IVC filter on coumadin, cardiomyopathy status post LVAD placement who presents for evaluation of bilateral epistaxis. On exam he is generally well-appearing and in no acute distress. He has a very tiny amount of blood in the nares bilaterally but mostly the mucosa appears just hyperemic. There is no blood in the posterior oropharynx. Clinical picture consistent with mild anterior epistaxis. Afrin and nasal clamp applied. We'll reassess. CBC and INR pending. We have discussed complete discontinuation of Indocin.  ----------------------------------------- 8:37 AM on 07/23/2015 ----------------------------------------- Hemoglobin stable at 13.0. INR 1.99. Epistaxes resolved at this time without any additional intervention. DC with return precautions and close PCP follow-up. The patient and his wife at bedside are comfortable with the discharge plan. I suspect the blood pressures obtained in our emergency department are likely spurious but his baseline systolic blood pressure appears to be approximately 90 when it is measured in the heart failure clinic. He currently feels well, denying chest pain, difficulty breathing, or lightheadedness. ____________________________________________   FINAL CLINICAL IMPRESSION(S) / ED DIAGNOSES  Final  diagnoses:  Acute anterior epistaxis      Joanne Gavel, MD 07/23/15 [id number]  Joanne Gavel, MD 07/23/15 [id number]  Joanne Gavel, MD 07/23/15 1010  Joanne Gavel, MD 07/23/15 1011

## 2015-07-23 NOTE — Discharge Instructions (Signed)

## 2015-07-25 NOTE — Telephone Encounter (Signed)
Wife called VAD pager to report pt is having nosebleed. Started @ 6:00 am in one nostril and now is bleeding from both nostrils.  They have tried pressure x 10 mins twice along with ice with no relief. Wife will take pt to [facility] ED.  Dr. Haroldine Laws and Dr. Aundra Dubin notified.

## 2015-07-27 ENCOUNTER — Encounter: Payer: Self-pay | Admitting: Emergency Medicine

## 2015-07-27 ENCOUNTER — Emergency Department
Admission: EM | Admit: 2015-07-27 | Discharge: 2015-07-27 | Disposition: A | Discharge status: Home / Self Care | Payer: Medicare Other | Attending: Emergency Medicine | Admitting: Emergency Medicine

## 2015-07-27 ENCOUNTER — Emergency Department
Admission: EM | Admit: 2015-07-27 | Discharge: 2015-07-27 | Disposition: A | Discharge status: Home / Self Care | Payer: Medicare Other | Source: Home / Self Care | Attending: Emergency Medicine | Admitting: Emergency Medicine

## 2015-07-27 ENCOUNTER — Encounter: Payer: Self-pay | Admitting: *Deleted

## 2015-07-27 DIAGNOSIS — E119 Type 2 diabetes mellitus without complications: Secondary | ICD-10-CM | POA: Insufficient documentation

## 2015-07-27 DIAGNOSIS — Z79899 Other long term (current) drug therapy: Secondary | ICD-10-CM | POA: Insufficient documentation

## 2015-07-27 DIAGNOSIS — Z7901 Long term (current) use of anticoagulants: Secondary | ICD-10-CM

## 2015-07-27 DIAGNOSIS — Z87891 Personal history of nicotine dependence: Secondary | ICD-10-CM | POA: Insufficient documentation

## 2015-07-27 DIAGNOSIS — I10 Essential (primary) hypertension: Secondary | ICD-10-CM | POA: Insufficient documentation

## 2015-07-27 DIAGNOSIS — Z792 Long term (current) use of antibiotics: Secondary | ICD-10-CM

## 2015-07-27 DIAGNOSIS — R040 Epistaxis: Secondary | ICD-10-CM | POA: Diagnosis not present

## 2015-07-27 LAB — CBC
HCT: 36.1 % — ABNORMAL LOW (ref 40.0–52.0)
Hemoglobin: 12.1 g/dL — ABNORMAL LOW (ref 13.0–18.0)
MCH: 30.2 pg (ref 26.0–34.0)
MCHC: 33.5 g/dL (ref 32.0–36.0)
MCV: 90.1 fL (ref 80.0–100.0)
PLATELETS: 198 10*3/uL (ref 150–440)
RBC: 4.01 MIL/uL — AB (ref 4.40–5.90)
RDW: 15.2 % — AB (ref 11.5–14.5)
WBC: 10.3 10*3/uL (ref 3.8–10.6)

## 2015-07-27 LAB — PROTIME-INR
INR: 1.83
PROTHROMBIN TIME: 21.1 s — AB (ref 11.4–15.0)

## 2015-07-27 LAB — POCT INR
INR: 1.8
INR: 1.8

## 2015-07-27 MED ORDER — ONDANSETRON HCL 4 MG/2ML IJ SOLN
4.0000 mg | Freq: Once | INTRAMUSCULAR | Status: AC
  Administered 2015-07-27: 4 mg via INTRAVENOUS
  Filled 2015-07-27: qty 2

## 2015-07-27 MED ORDER — CEPHALEXIN 500 MG PO CAPS: 500.0000 mg | ORAL_CAPSULE | Freq: Two times a day (BID) | ORAL | Status: AC

## 2015-07-27 NOTE — ED Notes (Signed)
Patient is reporting no more bleeding or taste of blood in his throat 15 mins after removal of nasal pressure device.

## 2015-07-27 NOTE — ED Notes (Signed)
Pt to ed with c/o nosebleed that started yesterday. Pt was seen here last night for same and nose was packed.  Pt reports nose is leaking blood, no noted blood at this time. Pt has an LVAD.

## 2015-07-27 NOTE — ED Notes (Signed)
Discussed IV options with patient, AC placement preferred. 

## 2015-07-27 NOTE — ED Notes (Signed)
MD at bedside. 

## 2015-07-27 NOTE — Discharge Instructions (Signed)

## 2015-07-27 NOTE — ED Notes (Signed)
Removed epistaxis device from nose per MD verbal.

## 2015-07-27 NOTE — ED Provider Notes (Signed)
[facility] Emergency Department Provider Note  ____________________________________________  Time seen: Approximately 123 AM  I have reviewed the triage vital signs and the nursing notes.   HISTORY  Chief Complaint Epistaxis    HPI Scott [name] is a 70 y.o. male who comes into the hospital with a nosebleed. The patient was here on 08-13-2022 with the same thing. The patient reports that the aroundmidnight. Since the patient was here on Saturday his wife attempted to clamp his nose but they decided to come into the hospital because it was bleeding much worse than it had previously. The patient reports that the blood is coming from bilateral nares. The patient is on Coumadin and has an LVAD. His wife reports that he had not been feeling well on January 13 so they took him Indocin for a few days between the 14th of the 17th. The patient's rheumatologist told him that Indocin can cause bleeding so told him not to take it anymore and they have not. The patient's last INR was 1.99 on 08-13-2022. The patient feels a little lightheaded but has no pain at this time.   Past Medical History  Diagnosis Date  . Hyperlipidemia   . Hypertension   . Diabetes ([place])   . Nephrolithiasis   . Ankylosing spondylitis ([place])   . CAD (coronary artery disease)     a. stenting of LCx 2013; b. STEMI 06/12/14 s/p PCI to LAD complicated by post cath shock requiring IABP; VT s/p DCCV, EF 20%; c. NSTEMI 06/26/14 treated medically.  . Ischemic cardiomyopathy     a. echo 08/23/2014 EF <20%, dilated CM, mod MR/TR  . Acute on chronic respiratory failure ([place])     a. 08/2014 in setting of PE.  . Bilateral pulmonary embolism ([place])     a. 08/2014 - started on Coumadin. Retrievable IVC filter placed 08/27/14 due to RV strain and large clot burden.  . Right leg DVT ([place])     a. 08/2014.  Marland Kitchen Chronic systolic CHF (congestive heart failure) ([place])   . Hypotension   . Hemoptysis     a. 08/2014  possibly due to pulm infarct/PE.  Marland Kitchen Pleural effusion on right 08/2014 - small  . Leukocytosis   . Carotid artery disease ([place])     a. s/p stenting.  . Ventricular tachycardia ([place])     a. 06/2014 at time of MI, s/p DCCV.  Marland Kitchen Reactive thrombocytosis 09/10/2014  . Leukocytosis 09/10/2014    Patient Active Problem List   Diagnosis Date Noted  . Fungal rash of trunk 03/17/2015  . Right femoral vein DVT ([place]) 01/10/2015  . Chronic anticoagulation 12/02/2014  . Anxiety state 10/11/2014  . Sleep disturbance 10/11/2014  . Left ventricular assist device (LVAD) complication 99991111  . LVAD (left ventricular assist device) present ([place])   . PAF (paroxysmal atrial fibrillation) ([place])   . CHF (congestive heart failure) ([place]) 09/21/2014  . Palliative care encounter 09/16/2014  . Weakness generalized 09/16/2014  . Encounter for central line placement   . Asystole ([place]) 09/10/2014  . Acute on chronic systolic CHF (congestive heart failure) ([place]) 09/10/2014  . Acute pulmonary embolism ([place]) 09/10/2014  . Acute on chronic systolic heart failure, NYHA class 3 ([place]) 09/10/2014  . Reactive thrombocytosis 09/10/2014  . Leukocytosis 09/10/2014  . Encounter for therapeutic drug monitoring 09/08/2014  . Cough with hemoptysis   . Acute respiratory failure with hypoxia ([place]) 08/27/2014  . Pleural effusion 08/27/2014  . Hypoxia   . PE (  pulmonary embolism) 08/26/2014  . Pulmonary embolus ([place]) 08/26/2014  . CAD in native artery 06/22/2014  . Chronic systolic heart failure ([place]) 06/22/2014  . Abdominal pain, acute   . STEMI (ST elevation myocardial infarction) ([place]) 06/12/2014  . Cardiogenic shock ([place]) 06/12/2014  . VT (ventricular tachycardia) ([place])   . Acute pulmonary edema (HCC)   . Acute respiratory acidosis   . Encounter for imaging study to confirm orogastric (OG) tube placement   . Respiratory failure ([place])   . ST elevation myocardial infarction involving left anterior descending (LAD)  coronary artery [facility])     Past Surgical History  Procedure Laterality Date  . Left heart catheterization with coronary angiogram N/A 06/12/2014    Procedure: LEFT HEART CATHETERIZATION WITH CORONARY ANGIOGRAM;  Surgeon: Lorretta Harp, MD;  Location: [facility] C[facility] LAB;  Service: Cardiovascular;  Laterality: N/A;  . Right heart catheterization N/A 09/10/2014    Procedure: RIGHT HEART CATH;  Surgeon: [name], MD;  Location: [facility] CATH LAB;  Service: Cardiovascular;  Laterality: N/A;  . Right heart catheterization N/A 09/17/2014    Procedure: RIGHT HEART CATH;  Surgeon: [name], MD;  Location: [facility] CATH LAB;  Service: Cardiovascular;  Laterality: N/A;  . Insertion of implantable left ventricular assist device N/A 09/21/2014    Procedure: INSERTION OF IMPLANTABLE LEFT VENTRICULAR ASSIST DEVICE;  Surgeon: Ivin Poot, MD;  Location: [place];  Service: Open Heart Surgery;  Laterality: N/A;  CIRC ARREST  NITRIC OXIDE  . Tee without cardioversion N/A 09/21/2014    Procedure: TRANSESOPHAGEAL ECHOCARDIOGRAM (TEE);  Surgeon: Ivin Poot, MD;  Location: [place];  Service: Open Heart Surgery;  Laterality: N/A;  . Tee without cardioversion N/A 10/05/2014    Procedure: TRANSESOPHAGEAL ECHOCARDIOGRAM (TEE);  Surgeon: Ivin Poot, MD;  Location: [place];  Service: Open Heart Surgery;  Laterality: N/A;  . Bedside evacuation of hematoma  10/05/2014    Procedure: EVACUATION OF HEMATOMA;  Surgeon: Ivin Poot, MD;  Location: [place];  Service: Open Heart Surgery;;  . Video assisted thoracoscopy (vats)/thorocotomy Left 10/06/2014    Procedure: VIDEO ASSISTED THORACOSCOPY (VATS)/THOROCOTOMY;  Surgeon: Ivin Poot, MD;  Location: [place];  Service: Open Heart Surgery;  Laterality: Left;    Current Outpatient Rx  Name  Route  Sig  Dispense  Refill  . acetaminophen (TYLENOL) 500 MG tablet   Oral   Take 1,000 mg by mouth every 6 (six) hours as needed for moderate pain.          . furosemide  (LASIX) 20 MG tablet      Every other day alternate 20 mg (1 pill) with 40 mg (2 pills)   180 tablet   3   . pantoprazole (PROTONIX) 40 MG tablet   Oral   Take 1 tablet (40 mg total) by mouth at bedtime.   90 tablet   3   . potassium chloride SA (K-DUR,KLOR-CON) 20 MEQ tablet   Oral   Take 1 tablet (20 mEq total) by mouth as directed. Take 1 pill with 20 mg Lasix daily and 2 pills with 40 mg Lasix daily   180 tablet   3   . rosuvastatin (CRESTOR) 10 MG tablet   Oral   Take 1 tablet (10 mg total) by mouth at bedtime.   90 tablet   3   . traMADol (ULTRAM) 50 MG tablet   Oral   Take 50 mg by mouth every 12 (twelve) hours as needed.         Marland Kitchen  traZODone (DESYREL) 100 MG tablet   Oral   Take 1 tablet (100 mg total) by mouth at bedtime.   30 tablet   6   . warfarin (COUMADIN) 4 MG tablet      Take 4 mg daily or as directed   30 tablet   11   . warfarin (COUMADIN) 6 MG tablet   Oral   Take 1 tablet (6 mg total) by mouth daily. Or as directed   45 tablet   6   . cephALEXin (KEFLEX) 500 MG capsule   Oral   Take 1 capsule (500 mg total) by mouth 2 (two) times daily.   10 capsule   0   . metaxalone (SKELAXIN) 800 MG tablet   Oral   Take 1 tablet (800 mg total) by mouth daily as needed for muscle spasms. Patient not taking: Reported on 07/27/2015   60 tablet   6     Allergies Review of patient's allergies indicates no known allergies.  Family History  Problem Relation Age of Onset  . Hypertension Father   . Heart attack Father   . Heart Problems Sister     Social History Social History  Substance Use Topics  . Smoking status: Former Smoker -- 0.30 packs/day for 0 years    Types: Cigarettes    Quit date: 06/12/1969  . 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  . Alcohol Use: No    Review of Systems Constitutional: No fever/chills Eyes: No visual changes. ENT: No sore throat. Cardiovascular:  Denies chest pain. Respiratory: Denies shortness of breath. Gastrointestinal: No abdominal pain.  No nausea, no vomiting.  No diarrhea.  No constipation. Genitourinary: Negative for dysuria. Musculoskeletal: Negative for back pain. Skin: Negative for rash. Neurological: Negative for headaches, focal weakness or numbness.  10-point ROS otherwise negative.  ____________________________________________   PHYSICAL EXAM:  VITAL SIGNS: ED Triage Vitals  Enc Vitals Group     BP 07/27/15 0116 136/102 mmHg     Pulse Rate 07/27/15 0116 132     Resp 07/27/15 0116 20     Temp --      Temp Source 07/27/15 0116 Oral     SpO2 07/27/15 0116 99 %     Weight 07/27/15 0116 184 lb (83.462 kg)     Height 07/27/15 0116 5\' 10"  (1.778 m)     Head Cir --      Peak Flow --      Pain Score 07/27/15 0117 5     Pain Loc --      Pain Edu? --      Excl. in [place]? --     Constitutional: Alert and oriented. Well appearing and in moderatedistress. Eyes: Conjunctivae are normal. PERRL. EOMI. Head: Atraumatic. Nose: bleeding coming from bilateral nostrils, L>R, not controlled with nasal clamp Mouth/Throat: Mucous membranes are moist.  Blood in oropharynx Cardiovascular: machine like sounds coming from anterior chest Respiratory: Normal respiratory effort.  No retractions. Lungs CTAB. Gastrointestinal: Soft and nontender. No distention. Positive bowel sounds Musculoskeletal: No lower extremity tenderness nor edema.  No joint effusions. Neurologic:  Normal speech and language.  Skin:  Skin is warm, dry and intact.  Psychiatric: Mood and affect are normal. Speech and behavior are normal.  ____________________________________________   LABS (all labs ordered are listed, but only abnormal results are displayed)  Labs Reviewed  PROTIME-INR - Abnormal; Notable for the following:    Prothrombin  Time 21.1 (*)    All other components within normal limits  CBC - Abnormal; Notable for the following:    RBC  4.01 (*)    Hemoglobin 12.1 (*)    HCT 36.1 (*)    RDW 15.2 (*)    All other components within normal limits   ____________________________________________  EKG  none ____________________________________________  RADIOLOGY  none ____________________________________________   PROCEDURES  Procedure(s) performed: None  Critical Care performed: No  ____________________________________________   INITIAL IMPRESSION / ASSESSMENT AND PLAN / ED COURSE  Pertinent labs & imaging results that were available during my care of the patient were reviewed by me and considered in my medical decision making (see chart for details).  This is a 70 year old male who comes into the hospital today with a spontaneous nosebleed. It started while the patient was asleep but he also did have a nosebleed earlier this week. I did place packing in the patient's bilateral nostrils to help control the bleeding. The patient did cough up some clots and have one episode of emesis. We will check the patient's INR as well as his CBC.  The patient's INR is 1.83 and his hemoglobin is 12.1 and his hematocrit is 36.1. The patient did have some resumption of his bleeding from the left nostril initially but we did hold some pressure and the bleeding improved. The patient was here for 2 hours and he did not have any continued bleeding. I feel at this time the patient can be discharged home with his nasal packing. I will give him some Keflex for prophylaxis and have him follow-up with ENT on Thursday. The patient has no further complaints or concerns at this time and he'll be discharged home. ____________________________________________   FINAL CLINICAL IMPRESSION(S) / ED DIAGNOSES  Final diagnoses:  Epistaxis      Loney Hering, MD 07/27/15 [phone]

## 2015-07-27 NOTE — ED Provider Notes (Signed)
[facility] Emergency Department Provider Note   ____________________________________________  Time seen:  I have reviewed the triage vital signs and the triage nursing note.  HISTORY  Chief Complaint Epistaxis   Historian Patient and wife  HPI Scott [name] is a 70 y.o. male with a history of LVAD, on Coumadin, who was seen earlier today for nosebleed and has nasal packing placed. He went home this morning around 5 AM and took a nap and when he woke up he had some bruising around the pack and spit up several bloody clots.  It's currently stopped now. No problem now with hypertension, no chest pain, no trouble breathing, no dizziness or passing out.    Past Medical History  Diagnosis Date  . Hyperlipidemia   . Hypertension   . Diabetes ([place])   . Nephrolithiasis   . Ankylosing spondylitis ([place])   . CAD (coronary artery disease)     a. stenting of LCx 2013; b. STEMI 06/12/14 s/p PCI to LAD complicated by post cath shock requiring IABP; VT s/p DCCV, EF 20%; c. NSTEMI 06/26/14 treated medically.  . Ischemic cardiomyopathy     a. echo 08/23/2014 EF <20%, dilated CM, mod MR/TR  . Acute on chronic respiratory failure ([place])     a. 08/2014 in setting of PE.  . Bilateral pulmonary embolism ([place])     a. 08/2014 - started on Coumadin. Retrievable IVC filter placed 08/27/14 due to RV strain and large clot burden.  . Right leg DVT ([place])     a. 08/2014.  Marland Kitchen Chronic systolic CHF (congestive heart failure) ([place])   . Hypotension   . Hemoptysis     a. 08/2014 possibly due to pulm infarct/PE.  Marland Kitchen Pleural effusion on right 08/2014 - small  . Leukocytosis   . Carotid artery disease ([place])     a. s/p stenting.  . Ventricular tachycardia ([place])     a. 06/2014 at time of MI, s/p DCCV.  Marland Kitchen Reactive thrombocytosis 09/10/2014  . Leukocytosis 09/10/2014    Patient Active Problem List   Diagnosis Date Noted  . Fungal rash of trunk 03/17/2015  . Right femoral vein DVT ([place])  01/10/2015  . Chronic anticoagulation 12/02/2014  . Anxiety state 10/11/2014  . Sleep disturbance 10/11/2014  . Left ventricular assist device (LVAD) complication 99991111  . LVAD (left ventricular assist device) present ([place])   . PAF (paroxysmal atrial fibrillation) ([place])   . CHF (congestive heart failure) ([place]) 09/21/2014  . Palliative care encounter 09/16/2014  . Weakness generalized 09/16/2014  . Encounter for central line placement   . Asystole ([place]) 09/10/2014  . Acute on chronic systolic CHF (congestive heart failure) ([place]) 09/10/2014  . Acute pulmonary embolism ([place]) 09/10/2014  . Acute on chronic systolic heart failure, NYHA class 3 ([place]) 09/10/2014  . Reactive thrombocytosis 09/10/2014  . Leukocytosis 09/10/2014  . Encounter for therapeutic drug monitoring 09/08/2014  . Cough with hemoptysis   . Acute respiratory failure with hypoxia ([place]) 08/27/2014  . Pleural effusion 08/27/2014  . Hypoxia   . PE (pulmonary embolism) 08/26/2014  . Pulmonary embolus ([place]) 08/26/2014  . CAD in native artery 06/22/2014  . Chronic systolic heart failure ([place]) 06/22/2014  . Abdominal pain, acute   . STEMI (ST elevation myocardial infarction) ([place]) 06/12/2014  . Cardiogenic shock ([place]) 06/12/2014  . VT (ventricular tachycardia) ([place])   . Acute pulmonary edema (HCC)   . Acute respiratory acidosis   . Encounter for imaging study to confirm orogastric (OG) tube  placement   . Respiratory failure ([place])   . ST elevation myocardial infarction involving left anterior descending (LAD) coronary artery [facility])     Past Surgical History  Procedure Laterality Date  . Left heart catheterization with coronary angiogram N/A 06/12/2014    Procedure: LEFT HEART CATHETERIZATION WITH CORONARY ANGIOGRAM;  Surgeon: Lorretta Harp, MD;  Location: [facility] CATH LAB;  Service: Cardiovascular;  Laterality: N/A;  . Right heart catheterization N/A 09/10/2014    Procedure: RIGHT HEART CATH;  Surgeon: [name], MD;  Location: [facility] CATH LAB;  Service: Cardiovascular;  Laterality: N/A;  . Right heart catheterization N/A 09/17/2014    Procedure: RIGHT HEART CATH;  Surgeon: [name], MD;  Location: [facility] CATH LAB;  Service: Cardiovascular;  Laterality: N/A;  . Insertion of implantable left ventricular assist device N/A 09/21/2014    Procedure: INSERTION OF IMPLANTABLE LEFT VENTRICULAR ASSIST DEVICE;  Surgeon: Ivin Poot, MD;  Location: [place];  Service: Open Heart Surgery;  Laterality: N/A;  CIRC ARREST  NITRIC OXIDE  . Tee without cardioversion N/A 09/21/2014    Procedure: TRANSESOPHAGEAL ECHOCARDIOGRAM (TEE);  Surgeon: Ivin Poot, MD;  Location: [place];  Service: Open Heart Surgery;  Laterality: N/A;  . Tee without cardioversion N/A 10/05/2014    Procedure: TRANSESOPHAGEAL ECHOCARDIOGRAM (TEE);  Surgeon: Ivin Poot, MD;  Location: [place];  Service: Open Heart Surgery;  Laterality: N/A;  . Bedside evacuation of hematoma  10/05/2014    Procedure: EVACUATION OF HEMATOMA;  Surgeon: Ivin Poot, MD;  Location: [place];  Service: Open Heart Surgery;;  . Video assisted thoracoscopy (vats)/thorocotomy Left 10/06/2014    Procedure: VIDEO ASSISTED THORACOSCOPY (VATS)/THOROCOTOMY;  Surgeon: Ivin Poot, MD;  Location: [place];  Service: Open Heart Surgery;  Laterality: Left;    Current Outpatient Rx  Name  Route  Sig  Dispense  Refill  . acetaminophen (TYLENOL) 500 MG tablet   Oral   Take 1,000 mg by mouth every 6 (six) hours as needed for moderate pain.          . cephALEXin (KEFLEX) 500 MG capsule   Oral   Take 1 capsule (500 mg total) by mouth 2 (two) times daily.   10 capsule   0   . furosemide (LASIX) 20 MG tablet      Every other day alternate 20 mg (1 pill) with 40 mg (2 pills)   180 tablet   3   . metaxalone (SKELAXIN) 800 MG tablet   Oral   Take 1 tablet (800 mg total) by mouth daily as needed for muscle spasms. Patient not taking: Reported on 07/27/2015   60  tablet   6   . pantoprazole (PROTONIX) 40 MG tablet   Oral   Take 1 tablet (40 mg total) by mouth at bedtime.   90 tablet   3   . potassium chloride SA (K-DUR,KLOR-CON) 20 MEQ tablet   Oral   Take 1 tablet (20 mEq total) by mouth as directed. Take 1 pill with 20 mg Lasix daily and 2 pills with 40 mg Lasix daily   180 tablet   3   . rosuvastatin (CRESTOR) 10 MG tablet   Oral   Take 1 tablet (10 mg total) by mouth at bedtime.   90 tablet   3   . traMADol (ULTRAM) 50 MG tablet   Oral   Take 50 mg by mouth every 12 (twelve) hours as needed.         Marland Kitchen  traZODone (DESYREL) 100 MG tablet   Oral   Take 1 tablet (100 mg total) by mouth at bedtime.   30 tablet   6   . warfarin (COUMADIN) 4 MG tablet      Take 4 mg daily or as directed   30 tablet   11   . warfarin (COUMADIN) 6 MG tablet   Oral   Take 1 tablet (6 mg total) by mouth daily. Or as directed   45 tablet   6     Allergies Review of patient's allergies indicates no known allergies.  Family History  Problem Relation Age of Onset  . Hypertension Father   . Heart attack Father   . Heart Problems Sister     Social History Social History  Substance Use Topics  . Smoking status: Former Smoker -- 0.30 packs/day for 0 years    Types: Cigarettes    Quit date: 06/12/1969  . 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  . Alcohol Use: No    Review of Systems  Constitutional: Negative for fever. Eyes:  ENT: Negative for sore throat. Cardiovascular: Negative for chest pain. Respiratory: Negative for shortness of breath. Gastrointestinal:  Genitourinary:  Musculoskeletal: Skin: Negative for rash. Neurological: Negative for headache. 10 point Review of Systems otherwise negative ____________________________________________   PHYSICAL EXAM:  VITAL SIGNS: ED Triage Vitals  Enc Vitals Group     BP --      Pulse Rate 07/27/15 1503 91     Resp 07/27/15  1503 18     Temp 07/27/15 1503 97.8 F (36.6 C)     Temp src --      SpO2 07/27/15 1503 97 %     Weight 07/27/15 1503 184 lb (83.462 kg)     Height --      Head Cir --      Peak Flow --      Pain Score 07/27/15 1504 2     Pain Loc --      Pain Edu? --      Excl. in [place]? --      Constitutional: Alert and oriented. Well appearing and in no distress. Eyes: Conjunctivae are normal. PERRL. Normal extraocular movements. ENT   Head: Normocephalic and atraumatic.   Nose: Two nasal packs in place.   Mouth/Throat: Mucous membranes are moist.   Neck: No stridor. Cardiovascular/Chest: Normal peripheral capillary beds. Respiratory: Normal respiratory effort without tachypnea nor retractions. Gastrointestinal: No distention. Genitourinary/rectal:Deferred Musculoskeletal:  Neurologic:  Normal speech and language. No gross or focal neurologic deficits are appreciated. Skin:  Skin is warm, dry and intact. No rash noted.   ____________________________________________   EKG I, Lisa Roca, MD, the attending physician have personally viewed and interpreted all ECGs.  None ____________________________________________  LABS (pertinent positives/negatives)  INR 1.8  ____________________________________________  RADIOLOGY All Xrays were viewed by me. Imaging interpreted by Radiologist.  None __________________________________________  PROCEDURES  Procedure(s) performed: None  Critical Care performed: None  ____________________________________________   ED COURSE / ASSESSMENT AND PLAN  Pertinent labs & imaging results that were available during my care of the patient were reviewed by me and considered in my medical decision making (see chart for details).   We discussed at length there may be some mild intermittent oozing, and that if he has large amount of bleeding that does not stop, we may change out his packing to something else such as a Rhino  Rocket, but  given the everything is under control at this point in time, we can leave alone.      CONSULTATIONS:   none   Patient / Family / Caregiver informed of clinical course, medical decision-making process, and agree with plan.   I discussed return precautions, follow-up instructions, and discharged instructions with patient and/or family.   ___________________________________________   FINAL CLINICAL IMPRESSION(S) / ED DIAGNOSES   Final diagnoses:  Epistaxis              Note: This dictation was prepared with Dragon dictation. Any transcriptional errors that result from this process are unintentional   Lisa Roca, MD 07/27/15 1744

## 2015-07-27 NOTE — ED Provider Notes (Signed)
Medical screening examination/treatment/procedure(s) were performed by non-physician practitioner and as supervising physician I was immediately available for consultation/collaboration.    Lisa Roca, MD 07/27/15 [phone]

## 2015-07-27 NOTE — ED Notes (Signed)
Pt awakened tonight at [place] with a nosebleed from both nares. Pt is on coumadin and is an LVAD pt.

## 2015-07-27 NOTE — ED Notes (Signed)
Pt presents to ER tonight with active nosebleed and dried blood on his hands along with a plastic nare pincher in place.  He states he tastes blood and has had this bleed for the last 30 minutes.  Pt is on coumadin according to Triage.

## 2015-07-28 ENCOUNTER — Ambulatory Visit (HOSPITAL_COMMUNITY): Payer: Medicare Other | Admitting: Infectious Diseases

## 2015-07-28 ENCOUNTER — Telehealth (HOSPITAL_COMMUNITY): Payer: Self-pay | Admitting: Infectious Diseases

## 2015-07-28 NOTE — Telephone Encounter (Signed)
Called to check in with Scott [name] re: ED visit last PM for epistaxis. Reports that the bleeding improved and subsided while being seen and he is doing better now. Aggravated with nasal packing but has appt for FU with ENT next week.   Will still check INR Friday as planned.   Janene Madeira, RN VAD Coordinator   Office: [phone] 24/7 VAD Pager: [phone]

## 2015-07-28 NOTE — Telephone Encounter (Signed)
Reports that Mr. Posadas has had ongoing nosebleeds since coming home from ED this AM when he had his nare packed. Indocin was started for ankylosing spondylitis and has been about 1 week ago since LD--feels the bleeding was a direct result of that combination with warfarin.   Describes that packing "looks saturated and is dripping with blood" and she is unsure of what she should do--Advised to contact the ENT to see if there is any availability to be seen today in the event they need to re-pack or cauterize; if not and bleeding persists to go to ED for evaluation.   Advised to hold coumadin tonight x 1 with this ongoing bleeding and check INR Friday. INR today 1.8.   She paged me back a short time later to let us know what ENT had said and that they would not cauterize since the packing was just placed. Will be going to ED for FU evaluation at [facility].   Janene Madeira, RN VAD Coordinator   Office: [phone] 24/7 VAD Pager: [phone]

## 2015-07-29 ENCOUNTER — Ambulatory Visit (HOSPITAL_COMMUNITY): Payer: Medicare Other | Admitting: *Deleted

## 2015-07-29 LAB — POCT INR: INR: 1.6

## 2015-08-01 ENCOUNTER — Ambulatory Visit (HOSPITAL_COMMUNITY): Payer: Self-pay | Admitting: Infectious Diseases

## 2015-08-01 DIAGNOSIS — R040 Epistaxis: Secondary | ICD-10-CM | POA: Diagnosis not present

## 2015-08-01 DIAGNOSIS — D65 Disseminated intravascular coagulation [defibrination syndrome]: Secondary | ICD-10-CM | POA: Diagnosis not present

## 2015-08-08 ENCOUNTER — Ambulatory Visit (HOSPITAL_COMMUNITY): Payer: Self-pay | Admitting: Infectious Diseases

## 2015-08-08 ENCOUNTER — Telehealth (HOSPITAL_COMMUNITY): Payer: Self-pay | Admitting: Infectious Diseases

## 2015-08-08 LAB — POCT INR: INR: 2.2

## 2015-08-08 NOTE — Telephone Encounter (Signed)
A user error has taken place: encounter opened in error, closed for administrative reasons.

## 2015-08-15 ENCOUNTER — Ambulatory Visit: Payer: Self-pay | Admitting: Infectious Diseases

## 2015-08-15 DIAGNOSIS — Z7901 Long term (current) use of anticoagulants: Secondary | ICD-10-CM | POA: Diagnosis not present

## 2015-08-15 LAB — POCT INR: INR: 1.7

## 2015-08-22 ENCOUNTER — Ambulatory Visit (HOSPITAL_COMMUNITY): Payer: Self-pay | Admitting: *Deleted

## 2015-08-22 LAB — POCT INR: INR: 2.1

## 2015-08-29 ENCOUNTER — Ambulatory Visit (HOSPITAL_COMMUNITY): Payer: Self-pay | Admitting: *Deleted

## 2015-08-29 LAB — POCT INR: INR: 2.9

## 2015-09-07 ENCOUNTER — Other Ambulatory Visit (HOSPITAL_COMMUNITY): Payer: Self-pay | Admitting: *Deleted

## 2015-09-08 ENCOUNTER — Encounter: Payer: Self-pay | Admitting: Licensed Clinical Social Worker

## 2015-09-08 ENCOUNTER — Other Ambulatory Visit (HOSPITAL_COMMUNITY): Payer: Self-pay | Admitting: *Deleted

## 2015-09-08 ENCOUNTER — Ambulatory Visit (HOSPITAL_COMMUNITY)
Admission: RE | Admit: 2015-09-08 | Discharge: 2015-09-08 | Disposition: A | Discharge status: Home / Self Care | Payer: Medicare Other | Source: Ambulatory Visit | Attending: Internal Medicine | Admitting: Internal Medicine

## 2015-09-08 ENCOUNTER — Ambulatory Visit (HOSPITAL_COMMUNITY): Payer: Self-pay | Admitting: *Deleted

## 2015-09-08 VITALS — BP 80/0 | HR 97 | Ht 68.0 in | Wt 193.4 lb

## 2015-09-08 DIAGNOSIS — I2510 Atherosclerotic heart disease of native coronary artery without angina pectoris: Secondary | ICD-10-CM | POA: Insufficient documentation

## 2015-09-08 DIAGNOSIS — Z87891 Personal history of nicotine dependence: Secondary | ICD-10-CM | POA: Diagnosis not present

## 2015-09-08 DIAGNOSIS — R0600 Dyspnea, unspecified: Secondary | ICD-10-CM | POA: Diagnosis not present

## 2015-09-08 DIAGNOSIS — I5023 Acute on chronic systolic (congestive) heart failure: Secondary | ICD-10-CM | POA: Diagnosis not present

## 2015-09-08 DIAGNOSIS — E119 Type 2 diabetes mellitus without complications: Secondary | ICD-10-CM | POA: Insufficient documentation

## 2015-09-08 DIAGNOSIS — Z86718 Personal history of other venous thrombosis and embolism: Secondary | ICD-10-CM | POA: Diagnosis not present

## 2015-09-08 DIAGNOSIS — I252 Old myocardial infarction: Secondary | ICD-10-CM | POA: Diagnosis not present

## 2015-09-08 DIAGNOSIS — Z7901 Long term (current) use of anticoagulants: Secondary | ICD-10-CM | POA: Diagnosis not present

## 2015-09-08 DIAGNOSIS — G4700 Insomnia, unspecified: Secondary | ICD-10-CM

## 2015-09-08 DIAGNOSIS — Z95811 Presence of heart assist device: Secondary | ICD-10-CM

## 2015-09-08 DIAGNOSIS — J449 Chronic obstructive pulmonary disease, unspecified: Secondary | ICD-10-CM | POA: Diagnosis not present

## 2015-09-08 DIAGNOSIS — M459 Ankylosing spondylitis of unspecified sites in spine: Secondary | ICD-10-CM | POA: Diagnosis not present

## 2015-09-08 DIAGNOSIS — I110 Hypertensive heart disease with heart failure: Secondary | ICD-10-CM | POA: Insufficient documentation

## 2015-09-08 DIAGNOSIS — Z79899 Other long term (current) drug therapy: Secondary | ICD-10-CM | POA: Insufficient documentation

## 2015-09-08 DIAGNOSIS — I255 Ischemic cardiomyopathy: Secondary | ICD-10-CM | POA: Diagnosis not present

## 2015-09-08 DIAGNOSIS — Z955 Presence of coronary angioplasty implant and graft: Secondary | ICD-10-CM | POA: Insufficient documentation

## 2015-09-08 DIAGNOSIS — Z86711 Personal history of pulmonary embolism: Secondary | ICD-10-CM | POA: Diagnosis not present

## 2015-09-08 DIAGNOSIS — Z8249 Family history of ischemic heart disease and other diseases of the circulatory system: Secondary | ICD-10-CM | POA: Insufficient documentation

## 2015-09-08 DIAGNOSIS — I5022 Chronic systolic (congestive) heart failure: Secondary | ICD-10-CM | POA: Diagnosis not present

## 2015-09-08 DIAGNOSIS — G479 Sleep disorder, unspecified: Secondary | ICD-10-CM | POA: Diagnosis not present

## 2015-09-08 DIAGNOSIS — E785 Hyperlipidemia, unspecified: Secondary | ICD-10-CM | POA: Diagnosis not present

## 2015-09-08 LAB — CBC
HEMATOCRIT: 34.5 % — AB (ref 39.0–52.0)
Hemoglobin: 11.2 g/dL — ABNORMAL LOW (ref 13.0–17.0)
MCH: 28.6 pg (ref 26.0–34.0)
MCHC: 32.5 g/dL (ref 30.0–36.0)
MCV: 88.2 fL (ref 78.0–100.0)
PLATELETS: 327 10*3/uL (ref 150–400)
RBC: 3.91 MIL/uL — ABNORMAL LOW (ref 4.22–5.81)
RDW: 14.9 % (ref 11.5–15.5)
WBC: 8.3 10*3/uL (ref 4.0–10.5)

## 2015-09-08 LAB — PREALBUMIN: PREALBUMIN: 23.5 mg/dL (ref 18–38)

## 2015-09-08 LAB — COMPREHENSIVE METABOLIC PANEL
ALBUMIN: 3.7 g/dL (ref 3.5–5.0)
ALT: 24 U/L (ref 17–63)
AST: 31 U/L (ref 15–41)
Alkaline Phosphatase: 159 U/L — ABNORMAL HIGH (ref 38–126)
Anion gap: 10 (ref 5–15)
BUN: 12 mg/dL (ref 6–20)
CHLORIDE: 107 mmol/L (ref 101–111)
CO2: 23 mmol/L (ref 22–32)
CREATININE: 0.97 mg/dL (ref 0.61–1.24)
Calcium: 9.4 mg/dL (ref 8.9–10.3)
GFR calc Af Amer: >60 mL/min (ref >60)
GFR calc non Af Amer: >60 mL/min (ref >60)
GLUCOSE: 80 mg/dL (ref 65–99)
Potassium: 4.3 mmol/L (ref 3.5–5.1)
SODIUM: 140 mmol/L (ref 135–145)
Total Bilirubin: 0.5 mg/dL (ref 0.3–1.2)
Total Protein: 7.7 g/dL (ref 6.5–8.1)

## 2015-09-08 LAB — LACTATE DEHYDROGENASE: LDH: 251 U/L — ABNORMAL HIGH (ref 98–192)

## 2015-09-08 LAB — PROTIME-INR
INR: 2.67 — ABNORMAL HIGH (ref 0.00–1.49)
Prothrombin Time: 28 seconds — ABNORMAL HIGH (ref 11.6–15.2)

## 2015-09-08 LAB — BRAIN NATRIURETIC PEPTIDE: B Natriuretic Peptide: 370.5 pg/mL — ABNORMAL HIGH (ref 0.0–100.0)

## 2015-09-08 NOTE — Patient Instructions (Signed)
1.  No change in medications. 2.  Return to [place] clinic in two months.

## 2015-09-08 NOTE — Progress Notes (Addendum)
Symptom  Yes  No  Details   Angina        x Activity:   Claudication        x How far:   Syncope        x When: Orthostatic dizziness (maybe improving a little)  Stroke        x   Orthopnea        x How many pillows:  1  PND        x How often:  BiPAP               N/A How many hrs:   Pedal edema               x   Abd fullness        x   N&V        x Good appetite; eating full meals   Diaphoresis        x When:   Bleeding        x      x 2 significant nosebleeds   Urine color   Light - medium yellow  SOB         x  Activity: shoveling dirt in wheel barrow  Palpitations         x When:  ICD shock       N/A   Hospitlizaitons                x When:   ED visit         x       x When/where/why:  08/08/2022 and 1/25 for nosebleeds  Other MD         x        When/who/why: Rheumotologist  Activity    increasing  Fluid    No limitations (2 lites)  Diet    Low sodium   Vital signs: HR:  97 MAP BP:  80 Automatic BP: 119/59 (79) O2 Sat: 98 % Wt:  193.4 lbs  Last wt: 192.6  lbs Home wts: 182 - 184 lbs  Ht:  5'8"   LVAD interrogation reveals (Reviewed with Dr Aundra Dubin):  Speed:  9200 Flow:  4.9 Power: 5.4 PI: 6.0 Alarms: low voltage advisories Events:  Rare PI  Fixed speed:  9200  Low speed limit: 8600  Primary Controller: Replace back up battery in 17 months. Back up controller:  Replace back up battery in 19 months.   LVAD interrogation negative for power spikes and speed drops/PI events. No alarms recorded on event log and none reported by patient/caregiver. Back up equipment is present. No adjustments were made to prescribed settings. Pt/caregiver deny any alarms or VAD equipment issues.   LVAD equipment check completed and is in good working order. Back-up equipment present. LVAD education done on emergency procedures and precautions and reviewed exit site care.    LVAD exit site:   Contact dermatitis resolved. Sterile dressing change every 3 - 4 days performed by [name] (wife)  using Sorbaview dressing without biopatch and cleaning area with saline only. Wife tried adding chloraprep as cleanser and pt developed dermatitis again, returned to normal saline only, dermatitis resolved.  Dressing changes using sterile technique every 1 - 3 days as necessary in order to keep dressing dry and intact. Exit site remains well healed and incorporated. The velour is fully implanted at exit site. Drive line anchor intact. Pt denies fever or chills.      Reviewed  and demonstrated the following to patient and caregiver:               Reviewed the steps for replacing the running system controller with the back-up system controller (see patient handbook section 2 or the appropriate pamphlet).             X  Demonstrated (using the mock-driveline and controller) how to connect and disconnect the mock-driveline in back up system controller in a timely manner (less than 10 seconds) with return demonstration by patient and caregiver.              X   Reviewed system controller alarms and troubleshooting including hazard and advisory alarms and accessing alarm history. Re-enforced NOT TO attempt to perform any task displayed on the display screen alone or without calling the VAD pager [phone] (see patient handbook section 5 or the appropriate pamphlet).                       X  Reviewed how to handle an emergency including when the pump is running and when the pump has stopped (see patient handbook section 8). Call 911 first and then the VAD pager at [phone].             X   Reviewed 14-volt lithium ion battery calibration steps (see patient handbook section 3).            X   Reviewed contents of black bag and what must be with patient at all times.            X  Reviewed driveline exit site including cleansing, dressing, and immobilizing with an anchor device to prevent exit site trauma.             X  Reviewed weekly maintenance which includes: Reviewing "Replacing the  [place] with a [employer]" pamphlet. Clean batteries, clips, and battery charger contacts.  Check cables for damages. Rotate batteries; keep all 8 charged.              X  Reviewed monthly maintenance which includes: Reviewing Alarms and Troubleshooting. Check battery manufacturer dates. Check use/charge cycles for each battery; remember to re-calibrate every 70 uses when prompted.              X  Additional pamphlets Guide to Replacing the [place] with the [address] for Patients and Their Caregivers and HM II Alarms for Patients and Their Caregivers given to patient.              X   Batteries Manufacture Date:    Number of uses: Re-calibration  08/13/14 80 - 84 Performed by patient   Annual maintenance completed per [employer] on patient's home power module and universal Charity fundraiser.    Backup system controller 11 volt battery charged during visit.   1 year  Intermacs follow up completed including:  Quality of Life, KCCQ-12; refused Neurocognitive trail making.   Pt completed 1110 feet during 6 minute walk, increased from 6 mos walk of 1025 feet.   Patient Instructions:  1.  No change in medications. 2.  Return to [facility] [facility] clinic in two months. Patient ID: Scott [name], male   DOB: 1946/02/29, 70 y.o.   MRN: [id number]   VAD CLINIC NOTE  Patient ID: Scott [name], male   DOB: 09-28-1945, 70 y.o.   MRN: [id number]  Primary Cardiologist: Dr Haroldine Laws INR : [facility]  HPI:  Scott [name] is a 70 y/o with h/o CAD with ankylosing spondylitis, DM2, carotid stenting, severe ischemic CM s/p anterior STEM with VT arrest in 12/15, large PE with pulmonary infarct in 2/15 s/p IVC filter and systolic HF with EF 123456.  He underwent HM II LVAD placement on 09/20/13. He had a prolonged hospital course due to recurrent chest bleeding, severe CO2 retention and RV failure.  Follow-up Presents today for follow-up. He is doing well. Active  though still with stiffness from ankylosing spondylitis.  Does have mild to moderate exertional dyspnea but this is unchanged. Able to do all his regular activities without too much problem. Weight stable. Takes furosemide 40 alternating with 20 daily.   No bleeding, fevers, chills, melena, dark urine. Performing self test every day. Wife doing dressing changes.   6MW today: 1110 feet.   Labs (7/16): K 4.7, creatinine 1.02, LDH 301, INR 1.97, HCT 38.2 Labs (10/16): HCT 42.8 Labs (3/17): BNP 371, HCT 34.5, LDH 251, creatinine 0.97   LVAD interrogation reveals:  See LVAD nurse's note above.   SH:  Social History   Social History  . Marital Status: Married    Spouse Name: N/A  . Number of Children: N/A  . Years of Education: N/A   Occupational History  . Not on file.   Social History Main Topics  . Smoking status: Former Smoker -- 0.30 packs/day for 0 years    Types: Cigarettes    Quit date: 06/12/1969  . 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  . Alcohol Use: No  . Drug Use: No  . Sexual Activity: Not Currently   Other Topics Concern  . Not on file   Social History Narrative    FH:  Family History  Problem Relation Age of Onset  . Hypertension Father   . Heart attack Father   . Heart Problems Sister     Past Medical History  Diagnosis Date  . Hyperlipidemia   . Hypertension   . Diabetes ([place])   . Nephrolithiasis   . Ankylosing spondylitis ([place])   . CAD (coronary artery disease)     a. stenting of LCx 2013; b. STEMI 06/12/14 s/p PCI to LAD complicated by post cath shock requiring IABP; VT s/p DCCV, EF 20%; c. NSTEMI 06/26/14 treated medically.  . Ischemic cardiomyopathy     a. echo 08/23/2014 EF <20%, dilated CM, mod MR/TR  . Acute on chronic respiratory failure ([place])     a. 08/2014 in setting of PE.  . Bilateral pulmonary embolism ([place])     a. 08/2014 - started on Coumadin. Retrievable IVC filter placed  08/27/14 due to RV strain and large clot burden.  . Right leg DVT ([place])     a. 08/2014.  Marland Kitchen Chronic systolic CHF (congestive heart failure) ([place])   . Hypotension   . Hemoptysis     a. 08/2014 possibly due to pulm infarct/PE.  Marland Kitchen Pleural effusion on right 08/2014 - small  . Leukocytosis   . Carotid artery disease ([place])     a. s/p stenting.  . Ventricular tachycardia ([place])     a. 06/2014 at time of MI, s/p DCCV.  Marland Kitchen Reactive thrombocytosis 09/10/2014  . Leukocytosis 09/10/2014    Current Outpatient Prescriptions  Medication Sig Dispense Refill  . acetaminophen (TYLENOL) 500 MG tablet Take 1,000 mg by mouth every 6 (six) hours as needed for moderate pain.     . furosemide (  LASIX) 20 MG tablet Every other day alternate 20 mg (1 pill) with 40 mg (2 pills) 180 tablet 3  . metaxalone (SKELAXIN) 800 MG tablet Take 1 tablet (800 mg total) by mouth daily as needed for muscle spasms. 60 tablet 6  . pantoprazole (PROTONIX) 40 MG tablet Take 1 tablet (40 mg total) by mouth at bedtime. 90 tablet 3  . potassium chloride SA (K-DUR,KLOR-CON) 20 MEQ tablet Take 1 tablet (20 mEq total) by mouth as directed. Take 1 pill with 20 mg Lasix daily and 2 pills with 40 mg Lasix daily 180 tablet 3  . rosuvastatin (CRESTOR) 10 MG tablet Take 1 tablet (10 mg total) by mouth at bedtime. 90 tablet 3  . traMADol (ULTRAM) 50 MG tablet Take 50 mg by mouth every 12 (twelve) hours as needed.    . traZODone (DESYREL) 100 MG tablet Take 1 tablet (100 mg total) by mouth at bedtime. 30 tablet 6  . warfarin (COUMADIN) 6 MG tablet Take 1 tablet (6 mg total) by mouth daily. Or as directed 45 tablet 6  . warfarin (COUMADIN) 4 MG tablet Take 4 mg daily or as directed (Patient not taking: Reported on 09/08/2015) 30 tablet 11   No current facility-administered medications for this encounter.   Vital signs: BP 80/0 mmHg  Pulse 97  Ht 5\' 8"  (1.727 m)  Wt 193 lb 6.4 oz (87.726 kg)  BMI 29.41 kg/m2  SpO2 98%  MAP 80   PHYSICAL  EXAM: General:  Well appearing.  No resp difficulty HEENT: normal Neck: limited ROM, JVP 5 cm. Carotids 2+ bilaterally; no bruits. No lymphadenopathy or thryomegaly appreciated. Cor: RRR + LVAD hum Lungs: clear  Abdomen: soft, nontender, nondistended. Driveline site clear Extremities: no cyanosis, clubbing, rash. No ankle edema Neuro: alert & orientedx3, cranial nerves grossly intact. Moves all 4 extremities w/o difficulty. Affect pleasant.  ASSESSMENT & PLAN: 1. Chronic systolic HF: Ischemic cardiomyopathy, EF 20% s/p HMII LVAD 09/20/13.  Doing very well post VAD, NYHA class II. He does not appear volume overloaded on exam.  2. CAD s/p Anterior STEMI on 06/12/14 with stenting of LAD: Stable. Continue statin. Off ASA due to bleeding.  3. VAD: Interrogated personally. Parameters stable.  4. Ankylosing spondylitis: Off NSAIDS. Skelaxin as needed.  5. Bilateral PE with pulmonary infarct: IVC filter removed.  On coumadin.  6. COPD/CO2 retention:  Improved. He is off Bipap.   7. Anticoagulation: Continue coumadin. Goal INR 1.8-2.2 due to bleeding. Off ASA.   Dalton McLean,MD 09/09/2015

## 2015-09-15 ENCOUNTER — Ambulatory Visit (HOSPITAL_COMMUNITY): Payer: Self-pay | Admitting: *Deleted

## 2015-09-15 DIAGNOSIS — Z7901 Long term (current) use of anticoagulants: Secondary | ICD-10-CM | POA: Diagnosis not present

## 2015-09-15 LAB — POCT INR: INR: 2.3

## 2015-09-22 ENCOUNTER — Ambulatory Visit (HOSPITAL_COMMUNITY): Payer: Self-pay | Admitting: *Deleted

## 2015-09-22 LAB — POCT INR: INR: 2.2

## 2015-09-28 NOTE — Progress Notes (Signed)
LVAD Reassessment Psychosocial Screening  Date/Time Initiated:  09/08/2015 LVAD Implant date:  09/21/2014 Annual:   XX   37-month      BTT:          DT:    XX   Name:  Scott TullochAddress:  [address] Phone:  [phone]:   Marital Status:  married Faith:  Lutheran Language:  english DOB:  [id number]  Medical & Follow-up Adherence to Medical regimen/INR checks:  compliant Medication adherence:  compliant Physician/Clinic Appointment Attendance:  compliant Do you smoke?  No  Do you drink alcohol? none  Have you ever used illegal drugs or misused medications?  no Any changes to your medical history since receiving the LVAD?  Cataract Surgery Do you have needed supplies for care of LVAD?  Yes, make up our own kits due to allergic reaction Do you have a charge/co-pay for supplies/INR visits?  Has a home INR machine Emergency Plan for electrical outage?  Neighbor/have generator Do you drive?  yes   Advance Directives  Living Will  yes Healthcare POA   Yes- wife Goals of Care   Yes- discussed  Psychological Health Appearance:  Well groomed and neat Mental Status:  Alert and oriented Eye Contact:  good Thought Content:  clear Speech:  Normal volume Mood:  appropriate Affect:  responsive Insight:  appropriate Judgement: sound Interaction Style:  interactive and appropriate with responses  Family/Social Information Who lives in your home? Name:   Relationship:   Scott [name]  wife Other family members/support persons in your life? Name:   Relationship:   Scott [name]  neighbor Caregiving Needs Who is the primary caregiver? Name: Scott SidlesWho is the secondary caregiver? Name: Scott DashSelf-care: independent Ambulation: independent Limitations: none "do everything real slow" Any barriers impacting ability to participate in care? none Who does your driveline site dressing changes?  wife How often are dressings changed?  weekly  Community Are you  active with community agencies/resources/homecare? none Are you active in a church, sGlass blower/designer mosque or other faith based community? [place] Are you active in any clubs or social organizations? none What do you do for fun?  Hobbies?  Interests? Patient responds "those days are over" and wife responds "he likes to polish and play with his cars" Patient has multiple antique cars.  Financial Information What is your source of income? retirement Are you working?  Plan to return? no Do you have difficulty meeting your monthly expenses? no If yes, which ones? Do you rent or own your home? own  Health Insurance Primary Health Insurance:  Medicare Secondary Insurance:  [employer] Prescription plan:   Medicare D      Pharmacy:      Have you ever had to refuse medication due to cost?    Patient denies any issues with meds.  Mental Health History How have you been feeling in the past year? Patient stated improved but not where he thought he would be. He had hoped for more energy. Do you have any concerns with body image with the LVAD implants? No How do you handle stressful situations? "deal with it" Are there any other stressors in your life? Patient responds "my wife" and she responds "I ask for too many kisses" Have you had any past or current thoughts of suicide? no How many hours do you sleep at night? Lousy 5 hours How is your appetite? good Hospital Anxiety and Depression Screen (HADS) Patient score:  10 (previous score 13) Do you need to see a counselor, psychiatrist or therapist?   no Do you attend or wish to attend the LVAD support group? Regular attendance  Please explain how your live has improved as a result of receiving the LVAD?  "If I hadn't received the LVAD I would be dead" Please tell me your biggest concern or fear about living with the LVAD?  none Do you feel you have adequate support from the VAD team?  Yes If not, how can we improve to meet your needs?     Caregiver questions How has your life been affected since your spouse/partner received the LVAD?   He needs a lot more care and support - "we didn't go anywhere the first 6 months" What is your biggest concern or fear about caregiving with an LVAD patient?  "That he gets aggravated" How do you cope with your concerns or fears? "deal with it" Do you have any barriers to caregiving?  none Do you feel you have adequate support from the VAD team? Yes If not, how can we help improve to meet your needs?   Hospital Anxiety and Depression Screen (HADS) Caregiver score: 8 (previously score 10)  Clinical Intervention Needed: CSW will continue to follow for support and continue to encourage attendance at the LVAD Support Group as well as any needs as they arise.   Clinical Impressions/Recommendations: Patient appears to be coping and adjusting to life with an LVAD. He appears to have not met his own expectations of improved health and energy levels with the LVAD implant which appears to frustrate him although he reports able to polish and play with his cars once again. He attends the LVAD support group where both he and his wife seem to find some commonality with others and peer support. Patient and wife appear to have a unique relationship and find comfort with each other.   Louann Liv, [place]

## 2015-09-29 ENCOUNTER — Ambulatory Visit (HOSPITAL_COMMUNITY): Payer: Self-pay | Admitting: *Deleted

## 2015-09-29 LAB — POCT INR: INR: 2.5

## 2015-10-06 ENCOUNTER — Ambulatory Visit (HOSPITAL_COMMUNITY): Payer: Self-pay | Admitting: *Deleted

## 2015-10-06 DIAGNOSIS — G479 Sleep disorder, unspecified: Secondary | ICD-10-CM

## 2015-10-06 DIAGNOSIS — G4700 Insomnia, unspecified: Secondary | ICD-10-CM

## 2015-10-06 LAB — POCT INR: INR: 2.5

## 2015-10-06 MED ORDER — TRAZODONE HCL 150 MG PO TABS: 150.0000 mg | ORAL_TABLET | Freq: Every day | ORAL | Status: DC

## 2015-10-13 ENCOUNTER — Ambulatory Visit (HOSPITAL_COMMUNITY): Payer: Self-pay | Admitting: Unknown Physician Specialty

## 2015-10-13 DIAGNOSIS — G479 Sleep disorder, unspecified: Secondary | ICD-10-CM

## 2015-10-13 DIAGNOSIS — Z7901 Long term (current) use of anticoagulants: Secondary | ICD-10-CM | POA: Diagnosis not present

## 2015-10-13 DIAGNOSIS — G4700 Insomnia, unspecified: Secondary | ICD-10-CM

## 2015-10-13 NOTE — Progress Notes (Signed)
Pts wife states that pt took 150 mg of Trazodone last night and has been sleepy most of the day. Pt's wife was instructed to decrease Trazodone tonight to the normal 100mg  tonight. Pt's wife verbalized understanding of all instructions given.

## 2015-10-19 DIAGNOSIS — M450 Ankylosing spondylitis of multiple sites in spine: Secondary | ICD-10-CM | POA: Diagnosis not present

## 2015-10-19 DIAGNOSIS — M150 Primary generalized (osteo)arthritis: Secondary | ICD-10-CM | POA: Diagnosis not present

## 2015-10-20 ENCOUNTER — Ambulatory Visit (HOSPITAL_COMMUNITY): Payer: Self-pay | Admitting: *Deleted

## 2015-10-20 LAB — POCT INR: INR: 2.8

## 2015-10-27 ENCOUNTER — Ambulatory Visit (HOSPITAL_COMMUNITY): Payer: Self-pay | Admitting: *Deleted

## 2015-10-27 LAB — POCT INR: INR: 2.7

## 2015-10-31 ENCOUNTER — Other Ambulatory Visit (HOSPITAL_COMMUNITY): Payer: Self-pay | Admitting: Unknown Physician Specialty

## 2015-10-31 ENCOUNTER — Ambulatory Visit (HOSPITAL_COMMUNITY)
Admission: RE | Admit: 2015-10-31 | Discharge: 2015-10-31 | Disposition: A | Discharge status: Home / Self Care | Payer: Medicare Other | Source: Ambulatory Visit | Attending: Internal Medicine | Admitting: Internal Medicine

## 2015-10-31 ENCOUNTER — Ambulatory Visit (HOSPITAL_COMMUNITY): Payer: Self-pay | Admitting: Unknown Physician Specialty

## 2015-10-31 VITALS — BP 100/0 | HR 67 | Ht 68.0 in | Wt 191.2 lb

## 2015-10-31 DIAGNOSIS — I252 Old myocardial infarction: Secondary | ICD-10-CM | POA: Insufficient documentation

## 2015-10-31 DIAGNOSIS — Z7901 Long term (current) use of anticoagulants: Secondary | ICD-10-CM | POA: Diagnosis not present

## 2015-10-31 DIAGNOSIS — E119 Type 2 diabetes mellitus without complications: Secondary | ICD-10-CM | POA: Insufficient documentation

## 2015-10-31 DIAGNOSIS — M459 Ankylosing spondylitis of unspecified sites in spine: Secondary | ICD-10-CM | POA: Insufficient documentation

## 2015-10-31 DIAGNOSIS — Z87891 Personal history of nicotine dependence: Secondary | ICD-10-CM | POA: Diagnosis not present

## 2015-10-31 DIAGNOSIS — J449 Chronic obstructive pulmonary disease, unspecified: Secondary | ICD-10-CM | POA: Insufficient documentation

## 2015-10-31 DIAGNOSIS — I5022 Chronic systolic (congestive) heart failure: Secondary | ICD-10-CM | POA: Insufficient documentation

## 2015-10-31 DIAGNOSIS — E785 Hyperlipidemia, unspecified: Secondary | ICD-10-CM | POA: Diagnosis not present

## 2015-10-31 DIAGNOSIS — Z955 Presence of coronary angioplasty implant and graft: Secondary | ICD-10-CM | POA: Insufficient documentation

## 2015-10-31 DIAGNOSIS — Z95811 Presence of heart assist device: Secondary | ICD-10-CM

## 2015-10-31 DIAGNOSIS — I110 Hypertensive heart disease with heart failure: Secondary | ICD-10-CM | POA: Diagnosis not present

## 2015-10-31 DIAGNOSIS — Z79899 Other long term (current) drug therapy: Secondary | ICD-10-CM | POA: Insufficient documentation

## 2015-10-31 DIAGNOSIS — I2510 Atherosclerotic heart disease of native coronary artery without angina pectoris: Secondary | ICD-10-CM | POA: Insufficient documentation

## 2015-10-31 DIAGNOSIS — I255 Ischemic cardiomyopathy: Secondary | ICD-10-CM | POA: Diagnosis not present

## 2015-10-31 DIAGNOSIS — Z86711 Personal history of pulmonary embolism: Secondary | ICD-10-CM | POA: Diagnosis not present

## 2015-10-31 DIAGNOSIS — Z8249 Family history of ischemic heart disease and other diseases of the circulatory system: Secondary | ICD-10-CM | POA: Diagnosis not present

## 2015-10-31 DIAGNOSIS — Z86718 Personal history of other venous thrombosis and embolism: Secondary | ICD-10-CM | POA: Diagnosis not present

## 2015-10-31 LAB — BASIC METABOLIC PANEL
ANION GAP: 9 (ref 5–15)
BUN: 15 mg/dL (ref 6–20)
CALCIUM: 9.5 mg/dL (ref 8.9–10.3)
CO2: 23 mmol/L (ref 22–32)
Chloride: 108 mmol/L (ref 101–111)
Creatinine, Ser: 1.09 mg/dL (ref 0.61–1.24)
GFR calc Af Amer: >60 mL/min (ref >60)
GLUCOSE: 106 mg/dL — AB (ref 65–99)
Potassium: 4.4 mmol/L (ref 3.5–5.1)
SODIUM: 140 mmol/L (ref 135–145)

## 2015-10-31 LAB — PROTIME-INR
INR: 2.02 — AB (ref 0.00–1.49)
PROTHROMBIN TIME: 22.7 s — AB (ref 11.6–15.2)

## 2015-10-31 LAB — CBC
HCT: 40.1 % (ref 39.0–52.0)
Hemoglobin: 12.4 g/dL — ABNORMAL LOW (ref 13.0–17.0)
MCH: 26.4 pg (ref 26.0–34.0)
MCHC: 30.9 g/dL (ref 30.0–36.0)
MCV: 85.3 fL (ref 78.0–100.0)
PLATELETS: 241 10*3/uL (ref 150–400)
RBC: 4.7 MIL/uL (ref 4.22–5.81)
RDW: 16 % — AB (ref 11.5–15.5)
WBC: 8.3 10*3/uL (ref 4.0–10.5)

## 2015-10-31 LAB — LACTATE DEHYDROGENASE: LDH: 285 U/L — ABNORMAL HIGH (ref 98–192)

## 2015-10-31 NOTE — Patient Instructions (Signed)
Patient Instructions:  1.  No change in medications. 2.  Return to [facility] clinic in two months.

## 2015-10-31 NOTE — Progress Notes (Signed)
VAD CLINIC NOTE  Patient ID: Scott [name], male   DOB: 1945/10/28, 70 y.o.   MRN: [id number]  Primary Cardiologist: Dr Haroldine Laws INR : [facility]  HPI: Scott [name] is a 70 y/o with h/o CAD with ankylosing spondylitis, DM2, carotid stenting, severe ischemic CM s/p anterior STEM with VT arrest in 12/15, large PE with pulmonary infarct in 2/15 s/p IVC filter and systolic HF with EF 123456.  He underwent HM II LVAD placement on 09/20/13. He had a prolonged hospital course due to recurrent chest bleeding, severe CO2 retention and RV failure.  Follow-up Presents today for follow-up. He is doing great. Remains very active. Denies CP or SOB.  Still struggling with pain and stiffness from ankylosing spondylitis. Weight stable. Takes furosemide 40 alternating with 20 daily.   Recently rolled over on driveline and had some pain at site. Wife said site was a little red and moist so she switched to daily dressing changes. Now better  No bleeding, fevers, chills, melena, dark urine. Performing self test every day. Wife doing dressing changes.   6MW last visit: 1110 feet.   Labs (7/16): K 4.7, creatinine 1.02, LDH 301, INR 1.97, HCT 38.2 Labs (10/16): HCT 42.8 Labs (3/17): BNP 371, HCT 34.5, LDH 251, creatinine 0.97   LVAD interrogation reveals (Reviewed with Dr Aundra Dubin):  Speed: 9200 Flow: 4.7 Power: 5.3 PI: 6.2 Alarms: low voltage advisories Events: Rare PI  Fixed speed: 9200  Low speed limit: 8600  Primary Controller: Replace back up battery in 15 months. Back up controller: Replace back up battery in 19 months.    SH:  Social History   Social History  . Marital Status: Married    Spouse Name: N/A  . Number of Children: N/A  . Years of Education: N/A   Occupational History  . Not on file.   Social History Main Topics  . Smoking status: Former Smoker -- 0.30 packs/day for 0 years    Types: Cigarettes    Quit date: 06/12/1969  . 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  . Alcohol Use: No  . Drug Use: No  . Sexual Activity: Not Currently   Other Topics Concern  . Not on file   Social History Narrative    FH:  Family History  Problem Relation Age of Onset  . Hypertension Father   . Heart attack Father   . Heart Problems Sister     Past Medical History  Diagnosis Date  . Hyperlipidemia   . Hypertension   . Diabetes ([place])   . Nephrolithiasis   . Ankylosing spondylitis ([place])   . CAD (coronary artery disease)     a. stenting of LCx 2013; b. STEMI 06/12/14 s/p PCI to LAD complicated by post cath shock requiring IABP; VT s/p DCCV, EF 20%; c. NSTEMI 06/26/14 treated medically.  . Ischemic cardiomyopathy     a. echo 08/23/2014 EF <20%, dilated CM, mod MR/TR  . Acute on chronic respiratory failure ([place])     a. 08/2014 in setting of PE.  . Bilateral pulmonary embolism ([place])     a. 08/2014 - started on Coumadin. Retrievable IVC filter placed 08/27/14 due to RV strain and large clot burden.  . Right leg DVT ([place])     a. 08/2014.  Marland Kitchen Chronic systolic CHF (congestive heart failure) ([place])   . Hypotension   . Hemoptysis     a. 08/2014 possibly due to pulm infarct/PE.  Marland Kitchen  Pleural effusion on right 08/2014 - small  . Leukocytosis   . Carotid artery disease ([place])     a. s/p stenting.  . Ventricular tachycardia ([place])     a. 06/2014 at time of MI, s/p DCCV.  Marland Kitchen Reactive thrombocytosis 09/10/2014  . Leukocytosis 09/10/2014    Current Outpatient Prescriptions  Medication Sig Dispense Refill  . acetaminophen (TYLENOL) 500 MG tablet Take 1,000 mg by mouth every 6 (six) hours as needed for moderate pain.     . cyclobenzaprine (FLEXERIL) 5 MG tablet Take 5 mg by mouth 3 (three) times daily as needed for muscle spasms.    . furosemide (LASIX) 20 MG tablet Every other day alternate 20 mg (1 pill) with 40 mg (2 pills) 180 tablet 3  . metaxalone (SKELAXIN) 800 MG tablet Take 1 tablet (800 mg total) by mouth daily  as needed for muscle spasms. 60 tablet 6  . pantoprazole (PROTONIX) 40 MG tablet Take 1 tablet (40 mg total) by mouth at bedtime. 90 tablet 3  . potassium chloride SA (K-DUR,KLOR-CON) 20 MEQ tablet Take 1 tablet (20 mEq total) by mouth as directed. Take 1 pill with 20 mg Lasix daily and 2 pills with 40 mg Lasix daily 180 tablet 3  . rosuvastatin (CRESTOR) 10 MG tablet Take 1 tablet (10 mg total) by mouth at bedtime. 90 tablet 3  . traMADol (ULTRAM) 50 MG tablet Take 50 mg by mouth every 12 (twelve) hours as needed.    . traZODone (DESYREL) 150 MG tablet Take 1 tablet (150 mg total) by mouth at bedtime. 90 tablet 3  . warfarin (COUMADIN) 4 MG tablet Take 4 mg daily or as directed 30 tablet 11  . warfarin (COUMADIN) 6 MG tablet Take 1 tablet (6 mg total) by mouth daily. Or as directed 45 tablet 6   No current facility-administered medications for this encounter.    BP 100/0 mmHg  Pulse 67  Ht 5\' 8"  (1.727 m)  Wt 191 lb 3.2 oz (86.728 kg)  BMI 29.08 kg/m2  SpO2 96%  Vital signs: HR: 67 MAP BP: 100 Automatic BP: 96/78 (84) O2 Sat: 96 % Wt: 191.2 lbs  Last wt: 193.4 lbs Home wts: 182 - 184 lbs  Ht: 5'8"   PHYSICAL EXAM: General:  Well appearing.  No resp difficulty HEENT: normal Neck: limited ROM, JVP flat  Carotids 2+ bilaterally; no bruits. No lymphadenopathy or thryomegaly appreciated. Cor: RRR + LVAD hum Lungs: clear  Abdomen: soft, nontender, nondistended. Driveline site clear Extremities: no cyanosis, clubbing, rash. No ankle edema Neuro: alert & orientedx3, cranial nerves grossly intact. Moves all 4 extremities w/o difficulty. Affect pleasant.  ASSESSMENT & PLAN: 1. Chronic systolic HF: Ischemic cardiomyopathy, EF 20% s/p HMII LVAD 09/20/13.   --Doing very well post VAD, NYHA class II. He does not appear volume overloaded on exam. Weight stable.  2. CAD s/p Anterior STEMI on 06/12/14 with stenting of LAD: Stable. Continue statin. Off ASA due to bleeding.  3. VAD:  Interrogated personally. Parameters stable.  4. Ankylosing spondylitis: Off NSAIDS. Now on tramadol and flexeril.   5. Bilateral PE with pulmonary infarct: IVC filter removed.  On coumadin.  6. COPD/CO2 retention:  Improved. He is off Bipap.   7. Anticoagulation: Continue coumadin. Goal INR 1.8-2.2 due to bleeding. Off ASA.   Bensimhon, Phong,MD 10/31/2015

## 2015-10-31 NOTE — Progress Notes (Signed)
Symptom  Yes  No  Details   Angina        x Activity:   Claudication        x How far:   Syncope        x When: Orthostatic dizziness (maybe improving a little)  Stroke        x   Orthopnea        x How many pillows:  1  PND        x How often:  BiPAP               N/A How many hrs:   Pedal edema               x   Abd fullness        x   N&V        x Good appetite; eating full meals   Diaphoresis        x When:   Bleeding              x 2 significant nosebleeds   Urine color      x Light - medium yellow  SOB       x    Activity: knee bends when working out   Palpitations         x When:  ICD shock       N/A   Hospitlizaitons                x When:   ED visit                x When/where/why:    Other MD                x When/who/why:  Activity    Working in the yard  Fluid    No limitations (2 lites)  Diet    Low sodium-doesn't add salt    Vital signs: HR:  67 MAP BP:  100 Automatic BP: 96/78 (84) O2 Sat: 96 % Wt:  191.2 lbs  Last wt: 193.4  lbs Home wts: 182 - 184 lbs  Ht:  5'8"   LVAD interrogation reveals (Reviewed with Dr Aundra Dubin):  Speed:  9200 Flow:  4.7 Power: 5.3 PI: 6.2 Alarms: low voltage advisories Events:  Rare PI  Fixed speed:  9200  Low speed limit: 8600  Primary Controller: Replace back up battery in 15 months. Back up controller:  Replace back up battery in 19 months.   LVAD interrogation negative for power spikes and speed drops/PI events. No alarms recorded on event log and none reported by patient/caregiver. Back up equipment is present. No adjustments were made to prescribed settings. Pt/caregiver deny any alarms or VAD equipment issues.   LVAD equipment check completed and is in good working order. Back-up equipment present. LVAD education done on emergency procedures and precautions and reviewed exit site care.    LVAD exit site:   Contact dermatitis resolved. Pt's wife called a few days prior stating the pt has had extra drainage at the  driveline site. She was instructed to return to daily dressing changes using gauze. Sterile dressing change every 3 - 4 days performed by [name] (wife) using daily dressing kit with gauze without biopatch and cleaning area with saline only. Wife tried adding chloraprep as cleanser and pt developed dermatitis again, returned to normal saline only, dermatitis resolved.  Dressing changes using sterile technique every 1 - 3 days as necessary  in order to keep dressing dry and intact. Exit site remains well healed and incorporated. Small amount of brown drainage that was dry and appearing to scab. There was evidence of "proud" tissue growth. No redness, swelling, or odor. The velour is fully implanted at exit site. Drive line anchor intact. Pt denies fever or chills.      Patient Instructions:  1.  No change in medications. 2.  Return to [place] clinic in two months.  Robyne Peers, RN VAD coordinator 10/31/2015

## 2015-11-03 ENCOUNTER — Ambulatory Visit (HOSPITAL_COMMUNITY): Payer: Self-pay | Admitting: *Deleted

## 2015-11-03 LAB — POCT INR: INR: 2.8

## 2015-11-09 ENCOUNTER — Encounter (HOSPITAL_COMMUNITY): Payer: Medicare Other

## 2015-11-10 ENCOUNTER — Ambulatory Visit (HOSPITAL_COMMUNITY): Payer: Self-pay | Admitting: *Deleted

## 2015-11-10 DIAGNOSIS — Z7901 Long term (current) use of anticoagulants: Secondary | ICD-10-CM | POA: Diagnosis not present

## 2015-11-10 LAB — POCT INR: INR: 2.5

## 2015-11-17 ENCOUNTER — Ambulatory Visit: Payer: Self-pay | Admitting: Unknown Physician Specialty

## 2015-11-17 LAB — POCT INR: INR: 2.6

## 2015-11-24 ENCOUNTER — Ambulatory Visit: Payer: Self-pay | Admitting: Infectious Diseases

## 2015-11-24 LAB — POCT INR: INR: 2.7

## 2015-12-01 LAB — POCT INR: INR: 2.6

## 2015-12-07 ENCOUNTER — Telehealth (HOSPITAL_COMMUNITY): Payer: Self-pay | Admitting: Infectious Diseases

## 2015-12-07 NOTE — Telephone Encounter (Signed)
Called re: missed POC INR from last week. She reports that she performed the test with results 2.6 however we did not receive communication from [place] / mdINR d/t a problem with their automated reporting system to which the patient's call the results to. Planning on checking it this Thursday.   Janene Madeira, RN VAD Coordinator   Office: [phone] 24/7 VAD Pager: [phone]

## 2015-12-08 ENCOUNTER — Ambulatory Visit (HOSPITAL_COMMUNITY): Payer: Self-pay | Admitting: Infectious Diseases

## 2015-12-08 DIAGNOSIS — Z7901 Long term (current) use of anticoagulants: Secondary | ICD-10-CM | POA: Diagnosis not present

## 2015-12-15 ENCOUNTER — Ambulatory Visit (HOSPITAL_COMMUNITY): Payer: Self-pay | Admitting: Infectious Diseases

## 2015-12-15 LAB — POCT INR: INR: 2.3

## 2015-12-19 DIAGNOSIS — H26499 Other secondary cataract, unspecified eye: Secondary | ICD-10-CM | POA: Diagnosis not present

## 2015-12-19 DIAGNOSIS — H59031 Cystoid macular edema following cataract surgery, right eye: Secondary | ICD-10-CM | POA: Diagnosis not present

## 2015-12-22 ENCOUNTER — Ambulatory Visit (HOSPITAL_COMMUNITY): Payer: Self-pay | Admitting: Infectious Diseases

## 2015-12-22 LAB — POCT INR: INR: 2.4

## 2015-12-27 ENCOUNTER — Encounter: Payer: Self-pay | Admitting: Internal Medicine

## 2016-01-04 ENCOUNTER — Other Ambulatory Visit (HOSPITAL_COMMUNITY): Payer: Self-pay | Admitting: Unknown Physician Specialty

## 2016-01-04 ENCOUNTER — Ambulatory Visit (HOSPITAL_COMMUNITY): Payer: Self-pay | Admitting: *Deleted

## 2016-01-04 ENCOUNTER — Ambulatory Visit (HOSPITAL_COMMUNITY)
Admission: RE | Admit: 2016-01-04 | Discharge: 2016-01-04 | Disposition: A | Discharge status: Home / Self Care | Payer: Medicare Other | Source: Ambulatory Visit | Attending: Internal Medicine | Admitting: Internal Medicine

## 2016-01-04 DIAGNOSIS — Z7901 Long term (current) use of anticoagulants: Secondary | ICD-10-CM

## 2016-01-04 DIAGNOSIS — Z79899 Other long term (current) drug therapy: Secondary | ICD-10-CM | POA: Diagnosis not present

## 2016-01-04 DIAGNOSIS — I5022 Chronic systolic (congestive) heart failure: Secondary | ICD-10-CM | POA: Diagnosis not present

## 2016-01-04 DIAGNOSIS — J449 Chronic obstructive pulmonary disease, unspecified: Secondary | ICD-10-CM | POA: Diagnosis not present

## 2016-01-04 DIAGNOSIS — I110 Hypertensive heart disease with heart failure: Secondary | ICD-10-CM | POA: Diagnosis not present

## 2016-01-04 DIAGNOSIS — E119 Type 2 diabetes mellitus without complications: Secondary | ICD-10-CM | POA: Insufficient documentation

## 2016-01-04 DIAGNOSIS — I255 Ischemic cardiomyopathy: Secondary | ICD-10-CM | POA: Insufficient documentation

## 2016-01-04 DIAGNOSIS — I252 Old myocardial infarction: Secondary | ICD-10-CM | POA: Diagnosis not present

## 2016-01-04 DIAGNOSIS — Z86718 Personal history of other venous thrombosis and embolism: Secondary | ICD-10-CM | POA: Insufficient documentation

## 2016-01-04 DIAGNOSIS — Z955 Presence of coronary angioplasty implant and graft: Secondary | ICD-10-CM | POA: Insufficient documentation

## 2016-01-04 DIAGNOSIS — Z95811 Presence of heart assist device: Secondary | ICD-10-CM

## 2016-01-04 DIAGNOSIS — Z87891 Personal history of nicotine dependence: Secondary | ICD-10-CM | POA: Diagnosis not present

## 2016-01-04 DIAGNOSIS — M459 Ankylosing spondylitis of unspecified sites in spine: Secondary | ICD-10-CM | POA: Insufficient documentation

## 2016-01-04 DIAGNOSIS — E785 Hyperlipidemia, unspecified: Secondary | ICD-10-CM | POA: Diagnosis not present

## 2016-01-04 DIAGNOSIS — Z8249 Family history of ischemic heart disease and other diseases of the circulatory system: Secondary | ICD-10-CM | POA: Diagnosis not present

## 2016-01-04 DIAGNOSIS — Z87442 Personal history of urinary calculi: Secondary | ICD-10-CM | POA: Diagnosis not present

## 2016-01-04 DIAGNOSIS — I2510 Atherosclerotic heart disease of native coronary artery without angina pectoris: Secondary | ICD-10-CM | POA: Insufficient documentation

## 2016-01-04 DIAGNOSIS — Z86711 Personal history of pulmonary embolism: Secondary | ICD-10-CM | POA: Diagnosis not present

## 2016-01-04 LAB — CBC
HCT: 35.9 % — ABNORMAL LOW (ref 39.0–52.0)
Hemoglobin: 11.1 g/dL — ABNORMAL LOW (ref 13.0–17.0)
MCH: 26.7 pg (ref 26.0–34.0)
MCHC: 30.9 g/dL (ref 30.0–36.0)
MCV: 86.3 fL (ref 78.0–100.0)
PLATELETS: 223 10*3/uL (ref 150–400)
RBC: 4.16 MIL/uL — AB (ref 4.22–5.81)
RDW: 17 % — AB (ref 11.5–15.5)
WBC: 7.4 10*3/uL (ref 4.0–10.5)

## 2016-01-04 LAB — BASIC METABOLIC PANEL
Anion gap: 4 — ABNORMAL LOW (ref 5–15)
BUN: 16 mg/dL (ref 6–20)
CALCIUM: 9.3 mg/dL (ref 8.9–10.3)
CHLORIDE: 107 mmol/L (ref 101–111)
CO2: 25 mmol/L (ref 22–32)
CREATININE: 1.04 mg/dL (ref 0.61–1.24)
GFR calc non Af Amer: >60 mL/min (ref >60)
Glucose, Bld: 90 mg/dL (ref 65–99)
Potassium: 4.3 mmol/L (ref 3.5–5.1)
SODIUM: 136 mmol/L (ref 135–145)

## 2016-01-04 LAB — PROTIME-INR
INR: 2.15 — ABNORMAL HIGH (ref 0.00–1.49)
PROTHROMBIN TIME: 23.8 s — AB (ref 11.6–15.2)

## 2016-01-04 LAB — LACTATE DEHYDROGENASE: LDH: 279 U/L — AB (ref 98–192)

## 2016-01-04 NOTE — Patient Instructions (Signed)
1.  No change in meds. 2.  Change dressing as needed to keep site dry and clean. 3.  Return to [place] clinic in 2 months.

## 2016-01-04 NOTE — Progress Notes (Signed)
Patient ID: Scott [name], male   DOB: [birth year], 70 y.o.   MRN: [id number]   VAD CLINIC NOTE  Patient ID: Scott [name], male   DOB: 02/08/1946, 70 y.o.   MRN: [id number]  Primary Cardiologist: Dr Haroldine Laws INR : [facility]  HPI: Scott [name] is a 70 y/o with h/o CAD with ankylosing spondylitis, DM2, carotid stenting, severe ischemic CM s/p anterior STEM with VT arrest in 12/15, large PE with pulmonary infarct in 2/15 s/p IVC filter and systolic HF with EF [id number].  He underwent HM II LVAD placement on 09/20/13. He had a prolonged hospital course due to recurrent chest bleeding, severe CO2 retention and RV failure.  Follow-up Presents today for follow-up. He continues to do very well. Remains very active. Denies CP or SOB.  Using flexeril and tramadol to deal with pain from ankylosing spondylitis. Appetite very good. Weight up maybe a couple of pounds. Takes furosemide 40 alternating with 20 daily. Changes dressing on a daily basis as he is often sweaty from activity.  No bleeding, fevers, chills, melena, dark urine. Performing self test every day. Wife doing dressing changes.   6MW last visit: 1110 feet.   Labs (7/16): K 4.7, creatinine 1.02, LDH 301, INR 1.97, HCT 38.2 Labs (10/16): HCT 42.8 Labs (3/17): BNP 371, HCT 34.5, LDH 251, creatinine 0.97   LVAD interrogation reveals:  Speed: 9200 Flow: 4.6 Power: 5.3 PI: 6.9 Alarms: low voltage advisories Events: 0 - 5 PI events daily  Fixed speed: 9200  Low speed limit: 8600  Primary Controller: Replace back up battery in 13 months. Back up controller: Replace back up battery in 17 months.   LVAD interrogation negative for power spikes and speed drops/PI events. No alarms recorded on event log and none reported by patient/caregiver. Back up equipment is present. No adjustments were made to prescribed settings. Pt/caregiver deny any alarms or VAD equipment issues.   LVAD equipment check completed and is in good working  order. Back-up equipment present. LVAD education done on emergency procedures and precautions and reviewed exit site care.   SH:  Social History   Social History  . Marital Status: Married    Spouse Name: N/A  . Number of Children: N/A  . Years of Education: N/A   Occupational History  . Not on file.   Social History Main Topics  . Smoking status: Former Smoker -- 0.30 packs/day for 0 years    Types: Cigarettes    Quit date: 06/12/1969  . 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  . Alcohol Use: No  . Drug Use: No  . Sexual Activity: Not Currently   Other Topics Concern  . Not on file   Social History Narrative    FH:  Family History  Problem Relation Age of Onset  . Hypertension Father   . Heart attack Father   . Heart Problems Sister     Past Medical History  Diagnosis Date  . Hyperlipidemia   . Hypertension   . Diabetes ([place])   . Nephrolithiasis   . Ankylosing spondylitis ([place])   . CAD (coronary artery disease)     a. stenting of LCx 2013; b. STEMI 06/12/14 s/p PCI to LAD complicated by post cath shock requiring IABP; VT s/p DCCV, EF 20%; c. NSTEMI 06/26/14 treated medically.  . Ischemic cardiomyopathy     a. echo 08/23/2014 EF <20%, dilated CM, mod MR/TR  . Acute on  chronic respiratory failure ([place])     a. 08/2014 in setting of PE.  . Bilateral pulmonary embolism ([place])     a. 08/2014 - started on Coumadin. Retrievable IVC filter placed 08/27/14 due to RV strain and large clot burden.  . Right leg DVT ([place])     a. 08/2014.  Marland Kitchen Chronic systolic CHF (congestive heart failure) ([place])   . Hypotension   . Hemoptysis     a. 08/2014 possibly due to pulm infarct/PE.  Marland Kitchen Pleural effusion on right 08/2014 - small  . Leukocytosis   . Carotid artery disease ([place])     a. s/p stenting.  . Ventricular tachycardia ([place])     a. 06/2014 at time of MI, s/p DCCV.  Marland Kitchen Reactive thrombocytosis 09/10/2014  . Leukocytosis 09/10/2014     Current Outpatient Prescriptions  Medication Sig Dispense Refill  . acetaminophen (TYLENOL) 500 MG tablet Take 1,000 mg by mouth every 6 (six) hours as needed for moderate pain.     . cyclobenzaprine (FLEXERIL) 5 MG tablet Take 5 mg by mouth 3 (three) times daily as needed for muscle spasms.    . furosemide (LASIX) 20 MG tablet Every other day alternate 20 mg (1 pill) with 40 mg (2 pills) 180 tablet 3  . pantoprazole (PROTONIX) 40 MG tablet Take 1 tablet (40 mg total) by mouth at bedtime. 90 tablet 3  . potassium chloride SA (K-DUR,KLOR-CON) 20 MEQ tablet Take 1 tablet (20 mEq total) by mouth as directed. Take 1 pill with 20 mg Lasix daily and 2 pills with 40 mg Lasix daily 180 tablet 3  . rosuvastatin (CRESTOR) 10 MG tablet Take 1 tablet (10 mg total) by mouth at bedtime. 90 tablet 3  . traMADol (ULTRAM) 50 MG tablet Take 50 mg by mouth every 12 (twelve) hours as needed.    . traZODone (DESYREL) 150 MG tablet Take 1 tablet (150 mg total) by mouth at bedtime. (Patient taking differently: Take 100 mg by mouth at bedtime. ) 90 tablet 3  . warfarin (COUMADIN) 6 MG tablet Take 1 tablet (6 mg total) by mouth daily. Or as directed 45 tablet 6  . warfarin (COUMADIN) 4 MG tablet Take 4 mg daily or as directed (Patient not taking: Reported on 01/04/2016) 30 tablet 11   No current facility-administered medications for this encounter.    BP 96/0 mmHg  Pulse 83  Ht 5\' 8"  (1.727 m)  Wt 197 lb 3.2 oz (89.449 kg)  BMI 29.99 kg/m2  SpO2 96%  Vital signs: HR: 83 Doppler modified systolic: 96 Automatic BP: 100/60 (78) O2 Sat: 96 % Wt: 197.2 lbs  Last wt: 191.2 lbs Home wts: 185 - 186 lbs  Ht: 5'8"   PHYSICAL EXAM: General:  Well appearing.  No resp difficulty HEENT: normal Neck: limited ROM, JVP flat  Carotids 2+ bilaterally; no bruits. No lymphadenopathy or thryomegaly appreciated. Cor: RRR + LVAD hum Lungs: clear  Abdomen: soft, nontender, nondistended. Driveline site  clear Extremities: no cyanosis, clubbing, rash. No ankle edema Neuro: alert & orientedx3, cranial nerves grossly intact. Moves all 4 extremities w/o difficulty. Affect pleasant.  ASSESSMENT & PLAN: 1. Chronic systolic HF: Ischemic cardiomyopathy, EF 20% s/p HMII LVAD 09/20/13.   --Doing very well post VAD, NYHA class II. He does not appear volume overloaded on exam. Weight up a few pounds but not fluid. 2. CAD s/p Anterior STEMI on 06/12/14 with stenting of LAD: Stable. Continue statin. Off ASA due to bleeding.  3. VAD: Interrogated personally.  Parameters stable. Daily dressing changes due to high level of activity.  4. Ankylosing spondylitis: Off NSAIDS. Now on tramadol and flexeril.   5. Bilateral PE with pulmonary infarct: IVC filter removed.  On coumadin.  6. COPD/CO2 retention:  Improved. He is off Bipap.   7. Anticoagulation: Continue coumadin. Goal INR 1.8-2.2 due to bleeding. Off ASA. INR today 2.15. LDH stable  RTC in 2 months  Scott [name], [place],MD 01/04/2016

## 2016-01-04 NOTE — Progress Notes (Signed)
Symptom  Yes  No  Details   Angina        x Activity:   Claudication        x How far:   Syncope        x When: Orthostatic dizziness (maybe improving a little)  Stroke        x   Orthopnea        x How many pillows:  1  PND        x How often:  BiPAP               N/A How many hrs:   Pedal edema               x   Abd fullness        x   N&V        x Good appetite; eating full meals   Diaphoresis        x When:   Bleeding              x   Urine color      x Light - medium yellow  SOB       x    Activity: frequently OOB; has to take breaks  Palpitations         x When:  ICD shock       N/A   Hospitlizaitons                x When:   ED visit                x When/where/why:    Other MD                x When/who/why:  Activity    Working in the yard  Fluid    No limitations (2 liters)  Diet    Low sodium-doesn't add salt    Vital signs: HR:  83 Doppler modified systolic:  96 Automatic BP:  100/60 (78) O2 Sat: 96 % Wt:  197.2  lbs  Last wt: 191.2  lbs Home wts:  185 - 186 lbs  Ht:  5'8"   LVAD interrogation reveals:  Speed:  9200 Flow:  4.6 Power: 5.3 PI: 6.9 Alarms: low voltage advisories Events:  0 - 5 PI events daily  Fixed speed:  9200  Low speed limit: 8600  Primary Controller: Replace back up battery in 13 months. Back up controller:  Replace back up battery in 17 months.   LVAD interrogation negative for power spikes and speed drops/PI events. No alarms recorded on event log and none reported by patient/caregiver. Back up equipment is present. No adjustments were made to prescribed settings. Pt/caregiver deny any alarms or VAD equipment issues.   LVAD equipment check completed and is in good working order. Back-up equipment present. LVAD education done on emergency procedures and precautions and reviewed exit site care.    LVAD exit site:   Contact dermatitis resolved. Sterile dressing change every 5 days performed by [name] (wife) using daily dressing kit with  gauze dressing; no biopatch or Aquacel silver; cleaning area with saline only. Wife tried adding chloraprep as cleanser and pt developed dermatitis again, returned to normal saline only, dermatitis resolved.  Dressing changes using sterile technique every 5 days snd as necessary in order to keep dressing dry and intact. Exit site remains well healed and incorporated. Wife reports small amount of brown drainage at each dressing change with no  foul odor. The velour is fully implanted at exit site. Drive line anchor intact. Pt denies fever or chills.     Encounter Details:  Reports that he is feeling well today and has been able to work on cars and perform yard work. Reports that his SOB continues, has to take "frequent breaks" while working; but is baseline with Ankylosing Spondylitis.   Wife reports he tried to increase Trazadone to 150 mg hs to improve sleep, but made him "too drowsy". Returned to Trazadone 100 mg daily and reports sleeping satisfactorily.  Pt reports his MD stopped Skelaxin and started Flexeril 5 mg tid with improved pain relief from Ankylosing Spondylitis.  VAD coordinator reviewed daily log from home for daily temperature, weight, and VAD parameters. Pt is performing daily controller and system monitor self tests along with completing weekly and monthly maintenance for LVAD equipment.    Dr. Haroldine Laws in to see patient today. No medication changes.   INR 2.15 today (goal 2 - 2.5). Refer to anticoag note for details.    LDH 279 >>285  Patient Instructions:  1.  No change in meds. 2.  Change dressing as needed to keep site dry and clean. 3.  Return to [place] clinic in 2 months.  Zada Girt, RN  VAD coordinator 01/04/2016

## 2016-01-05 ENCOUNTER — Telehealth (HOSPITAL_COMMUNITY): Payer: Self-pay | Admitting: Infectious Diseases

## 2016-01-05 DIAGNOSIS — Z7901 Long term (current) use of anticoagulants: Secondary | ICD-10-CM | POA: Diagnosis not present

## 2016-01-05 NOTE — Telephone Encounter (Signed)
Called back re: INR results and all labs performed yesterday as they requested on VAD VM.

## 2016-01-06 ENCOUNTER — Encounter (HOSPITAL_COMMUNITY): Payer: Self-pay | Admitting: Infectious Diseases

## 2016-01-12 ENCOUNTER — Ambulatory Visit (HOSPITAL_COMMUNITY): Payer: Self-pay | Admitting: Unknown Physician Specialty

## 2016-01-12 DIAGNOSIS — I2699 Other pulmonary embolism without acute cor pulmonale: Secondary | ICD-10-CM

## 2016-01-12 DIAGNOSIS — Z95811 Presence of heart assist device: Secondary | ICD-10-CM

## 2016-01-12 DIAGNOSIS — Z5181 Encounter for therapeutic drug level monitoring: Secondary | ICD-10-CM

## 2016-01-12 LAB — POCT INR: INR: 2.2

## 2016-01-19 ENCOUNTER — Ambulatory Visit (HOSPITAL_COMMUNITY): Payer: Self-pay | Admitting: Infectious Diseases

## 2016-01-19 LAB — POCT INR: INR: 2.9

## 2016-01-26 ENCOUNTER — Ambulatory Visit (HOSPITAL_COMMUNITY): Payer: Self-pay | Admitting: Infectious Diseases

## 2016-01-26 LAB — POCT INR: INR: 2.1

## 2016-02-02 ENCOUNTER — Ambulatory Visit (HOSPITAL_COMMUNITY): Payer: Self-pay | Admitting: *Deleted

## 2016-02-02 DIAGNOSIS — Z7901 Long term (current) use of anticoagulants: Secondary | ICD-10-CM

## 2016-02-02 DIAGNOSIS — Z95811 Presence of heart assist device: Secondary | ICD-10-CM

## 2016-02-02 LAB — POCT INR: INR: 2.3

## 2016-02-02 MED ORDER — WARFARIN SODIUM 6 MG PO TABS: 6.0000 mg | ORAL_TABLET | Freq: Every day | ORAL | 6 refills | Status: DC

## 2016-02-09 ENCOUNTER — Ambulatory Visit (HOSPITAL_COMMUNITY): Payer: Self-pay | Admitting: Unknown Physician Specialty

## 2016-02-09 DIAGNOSIS — Z95811 Presence of heart assist device: Secondary | ICD-10-CM

## 2016-02-09 DIAGNOSIS — I2699 Other pulmonary embolism without acute cor pulmonale: Secondary | ICD-10-CM

## 2016-02-09 DIAGNOSIS — Z5181 Encounter for therapeutic drug level monitoring: Secondary | ICD-10-CM

## 2016-02-09 LAB — POCT INR: INR: 2.4

## 2016-02-16 ENCOUNTER — Ambulatory Visit (HOSPITAL_COMMUNITY): Payer: Self-pay | Admitting: *Deleted

## 2016-02-16 LAB — POCT INR: INR: 2.2

## 2016-02-23 ENCOUNTER — Ambulatory Visit (HOSPITAL_COMMUNITY): Payer: Self-pay | Admitting: Unknown Physician Specialty

## 2016-02-23 LAB — POCT INR: INR: 2.4

## 2016-03-01 ENCOUNTER — Ambulatory Visit (HOSPITAL_COMMUNITY): Payer: Self-pay | Admitting: *Deleted

## 2016-03-01 ENCOUNTER — Other Ambulatory Visit (HOSPITAL_COMMUNITY): Payer: Self-pay | Admitting: *Deleted

## 2016-03-01 DIAGNOSIS — Z95811 Presence of heart assist device: Secondary | ICD-10-CM

## 2016-03-01 DIAGNOSIS — Z7901 Long term (current) use of anticoagulants: Secondary | ICD-10-CM | POA: Diagnosis not present

## 2016-03-01 LAB — POCT INR: INR: 2.7

## 2016-03-06 ENCOUNTER — Encounter (HOSPITAL_COMMUNITY): Payer: Medicare Other

## 2016-03-07 ENCOUNTER — Ambulatory Visit (HOSPITAL_COMMUNITY): Payer: Self-pay | Admitting: *Deleted

## 2016-03-07 ENCOUNTER — Ambulatory Visit (HOSPITAL_COMMUNITY)
Admission: RE | Admit: 2016-03-07 | Discharge: 2016-03-07 | Disposition: A | Discharge status: Home / Self Care | Payer: Medicare Other | Source: Ambulatory Visit | Attending: Cardiology | Admitting: Cardiology

## 2016-03-07 VITALS — BP 106/93 | HR 112 | Ht 68.0 in | Wt 192.8 lb

## 2016-03-07 DIAGNOSIS — Z7901 Long term (current) use of anticoagulants: Secondary | ICD-10-CM | POA: Diagnosis not present

## 2016-03-07 DIAGNOSIS — Z86711 Personal history of pulmonary embolism: Secondary | ICD-10-CM | POA: Insufficient documentation

## 2016-03-07 DIAGNOSIS — I255 Ischemic cardiomyopathy: Secondary | ICD-10-CM | POA: Insufficient documentation

## 2016-03-07 DIAGNOSIS — I5022 Chronic systolic (congestive) heart failure: Secondary | ICD-10-CM | POA: Diagnosis not present

## 2016-03-07 DIAGNOSIS — I2699 Other pulmonary embolism without acute cor pulmonale: Secondary | ICD-10-CM | POA: Diagnosis not present

## 2016-03-07 DIAGNOSIS — Z87891 Personal history of nicotine dependence: Secondary | ICD-10-CM | POA: Insufficient documentation

## 2016-03-07 DIAGNOSIS — I2510 Atherosclerotic heart disease of native coronary artery without angina pectoris: Secondary | ICD-10-CM | POA: Diagnosis not present

## 2016-03-07 DIAGNOSIS — I252 Old myocardial infarction: Secondary | ICD-10-CM | POA: Insufficient documentation

## 2016-03-07 DIAGNOSIS — Z95811 Presence of heart assist device: Secondary | ICD-10-CM

## 2016-03-07 DIAGNOSIS — I110 Hypertensive heart disease with heart failure: Secondary | ICD-10-CM | POA: Diagnosis not present

## 2016-03-07 DIAGNOSIS — E119 Type 2 diabetes mellitus without complications: Secondary | ICD-10-CM | POA: Insufficient documentation

## 2016-03-07 DIAGNOSIS — M459 Ankylosing spondylitis of unspecified sites in spine: Secondary | ICD-10-CM | POA: Diagnosis not present

## 2016-03-07 DIAGNOSIS — Z8249 Family history of ischemic heart disease and other diseases of the circulatory system: Secondary | ICD-10-CM | POA: Diagnosis not present

## 2016-03-07 DIAGNOSIS — E872 Acidosis: Secondary | ICD-10-CM | POA: Insufficient documentation

## 2016-03-07 LAB — COMPREHENSIVE METABOLIC PANEL
ALBUMIN: 4.1 g/dL (ref 3.5–5.0)
ALK PHOS: 122 U/L (ref 38–126)
ALT: 20 U/L (ref 17–63)
ANION GAP: 6 (ref 5–15)
AST: 27 U/L (ref 15–41)
BILIRUBIN TOTAL: 0.5 mg/dL (ref 0.3–1.2)
BUN: 19 mg/dL (ref 6–20)
CALCIUM: 9.6 mg/dL (ref 8.9–10.3)
CO2: 24 mmol/L (ref 22–32)
CREATININE: 1.08 mg/dL (ref 0.61–1.24)
Chloride: 106 mmol/L (ref 101–111)
GFR calc Af Amer: >60 mL/min (ref >60)
GFR calc non Af Amer: >60 mL/min (ref >60)
GLUCOSE: 135 mg/dL — AB (ref 65–99)
Potassium: 4.6 mmol/L (ref 3.5–5.1)
Sodium: 136 mmol/L (ref 135–145)
TOTAL PROTEIN: 8.2 g/dL — AB (ref 6.5–8.1)

## 2016-03-07 LAB — PROTIME-INR
INR: 2.31
PROTHROMBIN TIME: 25.8 s — AB (ref 11.4–15.2)

## 2016-03-07 LAB — CBC
HCT: 41.8 % (ref 39.0–52.0)
HEMOGLOBIN: 13.1 g/dL (ref 13.0–17.0)
MCH: 27.2 pg (ref 26.0–34.0)
MCHC: 31.3 g/dL (ref 30.0–36.0)
MCV: 86.9 fL (ref 78.0–100.0)
Platelets: 227 10*3/uL (ref 150–400)
RBC: 4.81 MIL/uL (ref 4.22–5.81)
RDW: 16.4 % — ABNORMAL HIGH (ref 11.5–15.5)
WBC: 10.4 10*3/uL (ref 4.0–10.5)

## 2016-03-07 LAB — LACTATE DEHYDROGENASE: LDH: 272 U/L — ABNORMAL HIGH (ref 98–192)

## 2016-03-07 NOTE — Patient Instructions (Signed)
1.  No change in medications. 2.  Will call you with INR results and coumadin dosing. 3.  Return to [place] clinic in 2 months.

## 2016-03-07 NOTE — Progress Notes (Addendum)
Symptom  Yes  No  Details   Angina        x Activity:   Claudication        x How far:   Syncope        x When: Orthostatic dizziness - unchanged  Stroke        x   Orthopnea        x How many pillows:  1  PND        x How often:  BiPAP               N/A How many hrs:   Pedal edema               x   Abd fullness        x   N&V        x Good appetite; eating full meals   Diaphoresis        x When:   Bleeding              x   Urine color      x Light - medium yellow  SOB       x    Activity: frequently OOB; has to take breaks  Palpitations         x When:  ICD shock       N/A   Hospitlizaitons                x When:   ED visit                x When/where/why:    Other MD                x When/who/why:  Activity    Working in the yard  Fluid    No limitations (2 liters)  Diet    Low sodium-doesn't add salt    Vital signs: HR:  112 Doppler modified systolic:  539; 767 Automatic BP:  106/93 (97); 101/72 (86) O2 Sat: 97 % Wt:  192.8  lbs  Last wt: 197.2  lbs Home wts:  185 - 186 lbs  Ht:  5'8"   LVAD interrogation reveals:  Speed:  9200 Flow:  5.3 Power:  5.3 PI: 6.7 Alarms: low voltage advisories Events:  0 - 5 PI events daily  Fixed speed:  9200  Low speed limit: 8600  Primary Controller: Replace back up battery in 11 months. Back up controller:  Replace back up battery in 13 months.   LVAD interrogation negative for power spikes and speed drops/PI events. No alarms recorded on event log and none reported by patient/caregiver. Back up equipment is present. No adjustments were made to prescribed settings. Pt/caregiver deny any alarms or VAD equipment issues.   LVAD equipment check completed and is in good working order. Back-up equipment present. LVAD education done on emergency procedures and precautions and reviewed exit site care.    LVAD exit site:   Contact dermatitis resolved. Sterile dressing change every 5 days performed by [name] (wife) using daily dressing  kit with gauze dressing; no biopatch or Aquacel silver; cleaning area with saline only. Dressing changes using sterile technique every 5 days snd as necessary in order to keep dressing dry and intact. Exit site remains well healed and incorporated. Wife reports small amount of brown drainage at each dressing change with no foul odor. The velour is fully implanted at exit site. Drive line anchor intact. Pt denies fever  or chills.   Pt/wife provided with 7 daily kits and 5 anchor attachment devices for home use.     Encounter Details:  Reports that he is feeling well today and has been able to work on cars and perform yard work. Reports that his SOB continues, has to take "frequent breaks" while working; increased pain and limitation with Ankylosing Spondylitis.   VAD coordinator reviewed daily log from home for daily temperature, weight, and VAD parameters. Pt is performing daily controller and system monitor self tests along with completing weekly and monthly maintenance for LVAD equipment.    Dr. Haroldine Laws in to see patient today. No medication changes.   INR 2.3 today (goal 2 - 2.5). Refer to anticoag note for details.    LDH  >> 272  Patient Instructions:  1.  No change in medications. 2.  Will call you with INR results and coumadin dosing. 3.  Return to [place] clinic in 2 months.   Zada Girt, RN  VAD coordinator 03/07/2016

## 2016-03-11 NOTE — Progress Notes (Signed)
Patient ID: DARRY ERHARDT, male   DOB: August 30, 1945, 70 y.o.   MRN: [id number]   VAD CLINIC NOTE  Primary Cardiologist: Dr Haroldine Laws INR : [facility]   [name] is a 70 y/o with h/o CAD with ankylosing spondylitis, DM2, carotid stenting, severe ischemic CM s/p anterior STEM with VT arrest in 12/15, large PE with pulmonary infarct in 2/15 s/p IVC filter and systolic HF with EF 123456.  He underwent HM II LVAD placement on 09/20/13. He had a prolonged hospital course due to recurrent chest bleeding, severe CO2 retention and RV failure.  Follow-up Presents today for follow-up. He continues to do very well. Remains active. Denies CP. Chronic dyspnea with moderate exertion. No significant edema. No orthopnea or PND. Feels ankylosing spondylitis pain is getting worse. Appetite good. Weight stable, . Takes furosemide 40 alternating with 20 daily. Changes dressing on a daily basis.  No bleeding, fevers, chills, melena, dark urine. Performing self test every day. Wife doing dressing changes.   6MW last visit: 1110 feet.   Labs (7/16): K 4.7, creatinine 1.02, LDH 301, INR 1.97, HCT 38.2 Labs (10/16): HCT 42.8 Labs (3/17): BNP 371, HCT 34.5, LDH 251, creatinine 0.97  LVAD interrogation reveals:  Speed:  9200 Flow:  5.3 Power:  5.3 PI: 6.7 Alarms: low voltage advisories Events:  0 - 5 PI events daily  Fixed speed:  9200  Low speed limit: 8600  Primary Controller: Replace back up battery in 13 months. Back up controller: Replace back up battery in 17 months.   LVAD interrogation negative for power spikes and speed drops/PI events. No alarms recorded on event log and none reported by patient/caregiver. Back up equipment is present. No adjustments were made to prescribed settings. Pt/caregiver deny any alarms or VAD equipment issues.   LVAD equipment check completed and is in good working order. Back-up equipment present. LVAD education done on emergency procedures and precautions and  reviewed exit site care.   SH:  Social History   Social History  . Marital status: Married    Spouse name: N/A  . Number of children: N/A  . Years of education: N/A   Occupational History  . Not on file.   Social History Main Topics  . Smoking status: Former Smoker    Packs/day: 0.30    Years: 0.00    Types: Cigarettes    Quit date: 06/12/1969  . 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  . Alcohol use No  . Drug use: No  . Sexual activity: Not Currently   Other Topics Concern  . Not on file   Social History Narrative  . No narrative on file    FH:  Family History  Problem Relation Age of Onset  . Hypertension Father   . Heart attack Father   . Heart Problems Sister     Past Medical History:  Diagnosis Date  . Acute on chronic respiratory failure ([place])    a. 08/2014 in setting of PE.  Marland Kitchen Ankylosing spondylitis ([place])   . Bilateral pulmonary embolism ([place])    a. 08/2014 - started on Coumadin. Retrievable IVC filter placed 08/27/14 due to RV strain and large clot burden.  Marland Kitchen CAD (coronary artery disease)    a. stenting of LCx 2013; b. STEMI 06/12/14 s/p PCI to LAD complicated by post cath shock requiring IABP; VT s/p DCCV, EF 20%; c. NSTEMI 06/26/14 treated medically.  . Carotid artery disease (  [place])    a. s/p stenting.  . Chronic systolic CHF (congestive heart failure) ([place])   . Diabetes ([place])   . Hemoptysis    a. 08/2014 possibly due to pulm infarct/PE.  Marland Kitchen Hyperlipidemia   . Hypertension   . Hypotension   . Ischemic cardiomyopathy    a. echo 08/23/2014 EF <20%, dilated CM, mod MR/TR  . Leukocytosis   . Leukocytosis 09/10/2014  . Nephrolithiasis   . Pleural effusion on right 08/2014 - small  . Reactive thrombocytosis 09/10/2014  . Right leg DVT ([place])    a. 08/2014.  Marland Kitchen Ventricular tachycardia ([place])    a. 06/2014 at time of MI, s/p DCCV.    Current Outpatient Prescriptions  Medication Sig Dispense Refill  .  acetaminophen (TYLENOL) 500 MG tablet Take 1,000 mg by mouth every 6 (six) hours as needed for moderate pain.     . cyclobenzaprine (FLEXERIL) 5 MG tablet Take 5 mg by mouth 3 (three) times daily as needed for muscle spasms.    . furosemide (LASIX) 20 MG tablet Every other day alternate 20 mg (1 pill) with 40 mg (2 pills) 180 tablet 3  . pantoprazole (PROTONIX) 40 MG tablet Take 1 tablet (40 mg total) by mouth at bedtime. 90 tablet 3  . potassium chloride SA (K-DUR,KLOR-CON) 20 MEQ tablet Take 1 tablet (20 mEq total) by mouth as directed. Take 1 pill with 20 mg Lasix daily and 2 pills with 40 mg Lasix daily 180 tablet 3  . rosuvastatin (CRESTOR) 10 MG tablet Take 1 tablet (10 mg total) by mouth at bedtime. 90 tablet 3  . traMADol (ULTRAM) 50 MG tablet Take 50 mg by mouth every 12 (twelve) hours as needed.    . traZODone (DESYREL) 150 MG tablet Take 1 tablet (150 mg total) by mouth at bedtime. (Patient taking differently: Take 100 mg by mouth at bedtime. ) 90 tablet 3  . warfarin (COUMADIN) 6 MG tablet Take 1 tablet (6 mg total) by mouth daily. Or as directed 45 tablet 6  . warfarin (COUMADIN) 4 MG tablet Take 4 mg daily or as directed (Patient not taking: Reported on 01/04/2016) 30 tablet 11   No current facility-administered medications for this encounter.    Vital signs: HR:  112 Doppler modified systolic:  [id number]; 123456 Automatic BP:  106/93 (97); 101/72 (86) O2 Sat: 97 % Wt:  192.8  lbs  Last wt: 197.2  lbs Home wts:  185 - 186 lbs  Ht:  5'8"    PHYSICAL EXAM: General:  Well appearing.  No resp difficulty HEENT: normal Neck: limited ROM, JVP flat  Carotids 2+ bilaterally; no bruits. No lymphadenopathy or thryomegaly appreciated. Cor: RRR + LVAD hum Lungs: clear  Abdomen: soft, nontender, nondistended. Driveline site clear Extremities: no cyanosis, clubbing, rash. No ankle edema Neuro: alert & orientedx3, cranial nerves grossly intact. Moves all 4 extremities w/o difficulty. Affect  pleasant.  ASSESSMENT & PLAN: 1. Chronic systolic HF: Ischemic cardiomyopathy, EF 20% s/p HMII LVAD 09/20/13.   --Doing very well post VAD, NYHA class II. He does not appear volume overloaded on exam.  2. CAD s/p Anterior STEMI on 06/12/14 with stenting of LAD: Stable. Continue statin. Off ASA due to bleeding.  3. VAD: Interrogated personally. Parameters stable. Daily dressing changes due to high level of activity.  4. Ankylosing spondylitis: Off NSAIDS. Now on tramadol and flexeril.  Pain worse. Will f/u with Dr. Alexandria Lodge ([facility]) 5. Bilateral PE with pulmonary infarct: IVC filter removed.  On  coumadin.  6. COPD/CO2 retention, post-op:  Improved. He is off Bipap.   7. Anticoagulation: Continue coumadin. Goal INR 1.8-2.2 due to bleeding. Off ASA. INR today 2.31. LDH stable at 272 8. HTN: BP high initially but improved during visit  RTC in 2 months  Bensimhon, Gregrey,MD 03/11/2016

## 2016-03-15 ENCOUNTER — Ambulatory Visit (HOSPITAL_COMMUNITY): Payer: Self-pay | Admitting: Infectious Diseases

## 2016-03-15 LAB — POCT INR: INR: 2.8

## 2016-03-22 ENCOUNTER — Ambulatory Visit (HOSPITAL_COMMUNITY): Payer: Self-pay | Admitting: Unknown Physician Specialty

## 2016-03-22 DIAGNOSIS — Z95811 Presence of heart assist device: Secondary | ICD-10-CM

## 2016-03-22 DIAGNOSIS — Z5181 Encounter for therapeutic drug level monitoring: Secondary | ICD-10-CM

## 2016-03-22 DIAGNOSIS — I2699 Other pulmonary embolism without acute cor pulmonale: Secondary | ICD-10-CM

## 2016-03-22 LAB — POCT INR: INR: 2.7

## 2016-03-29 ENCOUNTER — Ambulatory Visit (HOSPITAL_COMMUNITY): Payer: Self-pay | Admitting: Infectious Diseases

## 2016-03-29 DIAGNOSIS — Z7901 Long term (current) use of anticoagulants: Secondary | ICD-10-CM | POA: Diagnosis not present

## 2016-03-29 LAB — POCT INR: INR: 2.2

## 2016-04-05 ENCOUNTER — Ambulatory Visit (HOSPITAL_COMMUNITY): Payer: Self-pay | Admitting: *Deleted

## 2016-04-05 LAB — POCT INR: INR: 2.2

## 2016-04-13 ENCOUNTER — Ambulatory Visit (HOSPITAL_COMMUNITY): Payer: Self-pay | Admitting: Infectious Diseases

## 2016-04-13 LAB — POCT INR: INR: 2.5

## 2016-04-19 ENCOUNTER — Ambulatory Visit (HOSPITAL_COMMUNITY): Payer: Self-pay | Admitting: Pharmacist

## 2016-04-19 DIAGNOSIS — Z95811 Presence of heart assist device: Secondary | ICD-10-CM

## 2016-04-19 DIAGNOSIS — Z5181 Encounter for therapeutic drug level monitoring: Secondary | ICD-10-CM

## 2016-04-19 LAB — POCT INR: INR: 2.8

## 2016-04-26 ENCOUNTER — Ambulatory Visit (HOSPITAL_COMMUNITY): Payer: Self-pay

## 2016-04-26 DIAGNOSIS — Z95811 Presence of heart assist device: Secondary | ICD-10-CM

## 2016-04-26 DIAGNOSIS — Z5181 Encounter for therapeutic drug level monitoring: Secondary | ICD-10-CM

## 2016-04-26 DIAGNOSIS — Z7901 Long term (current) use of anticoagulants: Secondary | ICD-10-CM | POA: Diagnosis not present

## 2016-04-26 LAB — POCT INR: INR: 2.3

## 2016-04-26 NOTE — Patient Instructions (Signed)
Patient informed of therapeutic INR result (2.3). Patient attests that he continues to follow coumadin regimen with medication and diet. Normal daily routine, nothing further to report. Advised to continue daily regimen and recheck INR next Thursday. Aware and agreeable to plan. No questions/concerns at this time.

## 2016-04-26 NOTE — Progress Notes (Signed)
Patient informed of therapeutic INR result (2.3). Patient attests that he continues to follow coumadin regimen with medication and diet. Normal daily routine, nothing further to report. Advised to continue daily regimen and recheck INR next Thursday. Aware and agreeable to plan. No questions/concerns at this time.  Renee Pain, RN

## 2016-05-03 ENCOUNTER — Ambulatory Visit (HOSPITAL_COMMUNITY): Payer: Self-pay | Admitting: Pharmacist

## 2016-05-03 DIAGNOSIS — Z5181 Encounter for therapeutic drug level monitoring: Secondary | ICD-10-CM

## 2016-05-03 DIAGNOSIS — Z95811 Presence of heart assist device: Secondary | ICD-10-CM

## 2016-05-03 LAB — POCT INR: INR: 2.2

## 2016-05-07 ENCOUNTER — Other Ambulatory Visit (HOSPITAL_COMMUNITY): Payer: Self-pay | Admitting: Infectious Diseases

## 2016-05-07 DIAGNOSIS — I5022 Chronic systolic (congestive) heart failure: Secondary | ICD-10-CM

## 2016-05-07 DIAGNOSIS — Z5181 Encounter for therapeutic drug level monitoring: Secondary | ICD-10-CM

## 2016-05-07 DIAGNOSIS — Z95811 Presence of heart assist device: Secondary | ICD-10-CM

## 2016-05-07 DIAGNOSIS — Z7901 Long term (current) use of anticoagulants: Secondary | ICD-10-CM

## 2016-05-08 ENCOUNTER — Ambulatory Visit (HOSPITAL_COMMUNITY)
Admission: RE | Admit: 2016-05-08 | Discharge: 2016-05-08 | Disposition: A | Discharge status: Home / Self Care | Payer: Medicare Other | Source: Ambulatory Visit | Attending: Internal Medicine | Admitting: Internal Medicine

## 2016-05-08 ENCOUNTER — Ambulatory Visit (HOSPITAL_COMMUNITY): Payer: Self-pay | Admitting: Pharmacist

## 2016-05-08 ENCOUNTER — Ambulatory Visit (HOSPITAL_COMMUNITY)
Admission: RE | Admit: 2016-05-08 | Discharge: 2016-05-08 | Disposition: A | Discharge status: Home / Self Care | Payer: Medicare Other | Source: Ambulatory Visit | Attending: Cardiology | Admitting: Cardiology

## 2016-05-08 DIAGNOSIS — I5022 Chronic systolic (congestive) heart failure: Secondary | ICD-10-CM | POA: Insufficient documentation

## 2016-05-08 DIAGNOSIS — Z7901 Long term (current) use of anticoagulants: Secondary | ICD-10-CM | POA: Diagnosis not present

## 2016-05-08 DIAGNOSIS — R0609 Other forms of dyspnea: Secondary | ICD-10-CM

## 2016-05-08 DIAGNOSIS — I5023 Acute on chronic systolic (congestive) heart failure: Secondary | ICD-10-CM

## 2016-05-08 DIAGNOSIS — J984 Other disorders of lung: Secondary | ICD-10-CM | POA: Diagnosis not present

## 2016-05-08 DIAGNOSIS — Z95811 Presence of heart assist device: Secondary | ICD-10-CM | POA: Insufficient documentation

## 2016-05-08 DIAGNOSIS — Z5181 Encounter for therapeutic drug level monitoring: Secondary | ICD-10-CM

## 2016-05-08 DIAGNOSIS — I5041 Acute combined systolic (congestive) and diastolic (congestive) heart failure: Secondary | ICD-10-CM

## 2016-05-08 DIAGNOSIS — G479 Sleep disorder, unspecified: Secondary | ICD-10-CM

## 2016-05-08 DIAGNOSIS — I480 Paroxysmal atrial fibrillation: Secondary | ICD-10-CM

## 2016-05-08 DIAGNOSIS — Z79899 Other long term (current) drug therapy: Secondary | ICD-10-CM

## 2016-05-08 LAB — CBC
HCT: 37.1 % — ABNORMAL LOW (ref 39.0–52.0)
HEMOGLOBIN: 11.9 g/dL — AB (ref 13.0–17.0)
MCH: 27.9 pg (ref 26.0–34.0)
MCHC: 32.1 g/dL (ref 30.0–36.0)
MCV: 87.1 fL (ref 78.0–100.0)
PLATELETS: 225 10*3/uL (ref 150–400)
RBC: 4.26 MIL/uL (ref 4.22–5.81)
RDW: 16.1 % — ABNORMAL HIGH (ref 11.5–15.5)
WBC: 8.7 10*3/uL (ref 4.0–10.5)

## 2016-05-08 LAB — BASIC METABOLIC PANEL
ANION GAP: 7 (ref 5–15)
BUN: 15 mg/dL (ref 6–20)
CALCIUM: 9.5 mg/dL (ref 8.9–10.3)
CO2: 25 mmol/L (ref 22–32)
CREATININE: 1.09 mg/dL (ref 0.61–1.24)
Chloride: 107 mmol/L (ref 101–111)
GLUCOSE: 103 mg/dL — AB (ref 65–99)
Potassium: 4.1 mmol/L (ref 3.5–5.1)
Sodium: 139 mmol/L (ref 135–145)

## 2016-05-08 LAB — POCT INR: INR: 2.4

## 2016-05-08 LAB — LACTATE DEHYDROGENASE: LDH: 284 U/L — AB (ref 98–192)

## 2016-05-08 MED ORDER — PANTOPRAZOLE SODIUM 40 MG PO TBEC: 40.0000 mg | DELAYED_RELEASE_TABLET | Freq: Every day | ORAL | 3 refills | Status: DC

## 2016-05-08 NOTE — Progress Notes (Signed)
HeartMate II Controller SW Version 7.29 upgrade performed in clinic today to accomodate new recommendations from [employer].  Consent reviewed and signed.  Tem Tech representative Armed forces technical officer.  VAD team performed elective controller exchange.  Patient tolerated controller exchange well, asymptomatic.  Pump restarted as expected with stable VAD parameters on correct prescribed speed of 9200/8600 rpms. Primary: [id number] Both back up batteries have 11 months until replacement of 11v battery.  Renee Pain, RN

## 2016-05-08 NOTE — Patient Instructions (Addendum)
Follow up with echocardiogram in 2 months.  Chest xray today.  Protonix refilled today.  Will refill crestor with lipid panel at next appointment.

## 2016-05-17 ENCOUNTER — Ambulatory Visit (HOSPITAL_COMMUNITY): Payer: Self-pay | Admitting: Pharmacist

## 2016-05-17 DIAGNOSIS — Z5181 Encounter for therapeutic drug level monitoring: Secondary | ICD-10-CM

## 2016-05-17 DIAGNOSIS — Z95811 Presence of heart assist device: Secondary | ICD-10-CM

## 2016-05-17 LAB — POCT INR: INR: 2.2

## 2016-05-24 ENCOUNTER — Ambulatory Visit (HOSPITAL_COMMUNITY): Payer: Self-pay | Admitting: Pharmacist

## 2016-05-24 DIAGNOSIS — Z7901 Long term (current) use of anticoagulants: Secondary | ICD-10-CM | POA: Diagnosis not present

## 2016-05-24 DIAGNOSIS — Z95811 Presence of heart assist device: Secondary | ICD-10-CM

## 2016-05-24 DIAGNOSIS — Z5181 Encounter for therapeutic drug level monitoring: Secondary | ICD-10-CM

## 2016-05-24 LAB — POCT INR: INR: 2.4

## 2016-05-28 IMAGING — CR DG CHEST 1V PORT
1 series · 1 of 1 positions shown · non-contrast
Comparison: 09/11/2014 at 4486 hour

CLINICAL DATA: Central line placement.

EXAM:
PORTABLE CHEST - 1 VIEW

[AP]
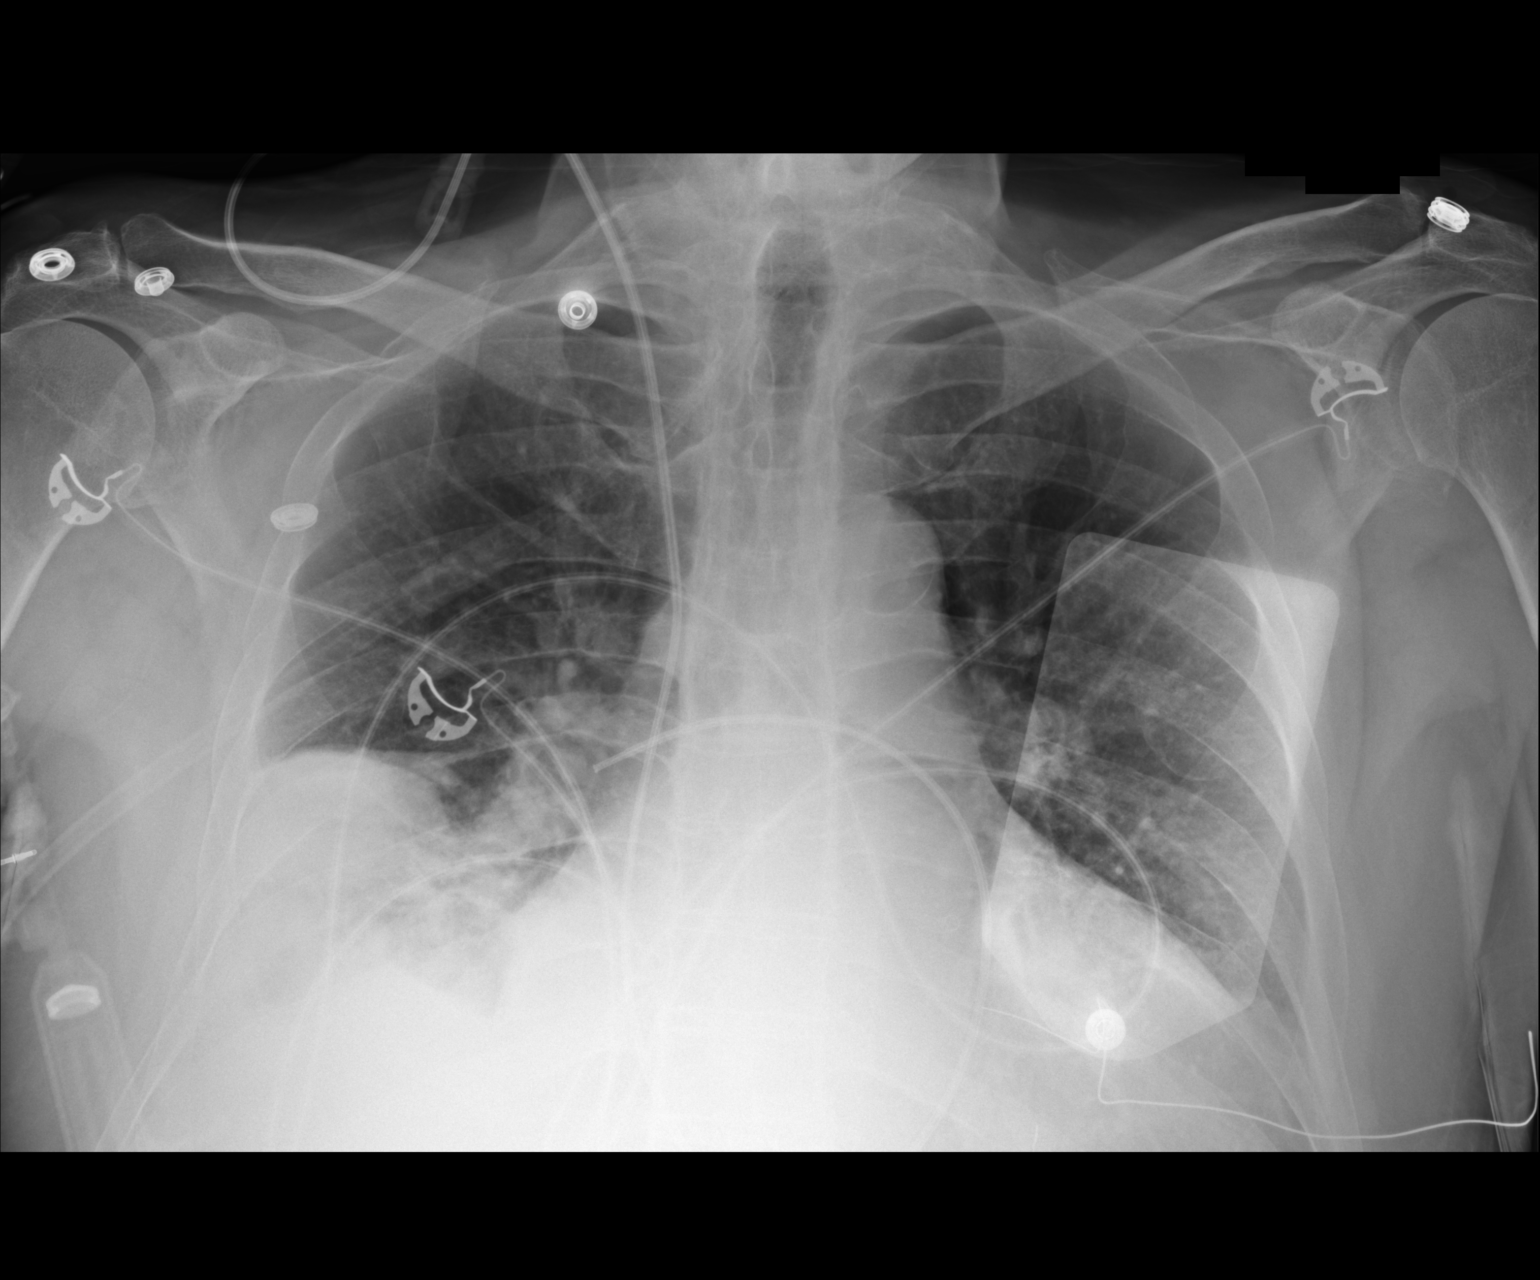

[1 of 1 positions shown; findings below may reference images not displayed]

FINDINGS: The right internal jugular Swan-Ganz catheter has been retracted,
tip in the region of the right pulmonary outflow tract. Right lower
lobe airspace disease and pleural effusion, not significantly
changed from prior exam. Cardiomegaly is unchanged. No pulmonary
edema.
IMPRESSION: 1. Tip of the right Swan-Ganz catheter has been retracted, now in
the region of the proximal right pulmonary outflow tract.
2. Unchanged right basilar airspace disease and cardiomegaly.

## 2016-05-31 ENCOUNTER — Ambulatory Visit (HOSPITAL_COMMUNITY): Payer: Self-pay | Admitting: Pharmacist

## 2016-05-31 DIAGNOSIS — Z5181 Encounter for therapeutic drug level monitoring: Secondary | ICD-10-CM

## 2016-05-31 DIAGNOSIS — Z95811 Presence of heart assist device: Secondary | ICD-10-CM

## 2016-05-31 LAB — POCT INR: INR: 2.6

## 2016-06-07 ENCOUNTER — Ambulatory Visit (HOSPITAL_COMMUNITY): Payer: Self-pay | Admitting: Pharmacist

## 2016-06-07 DIAGNOSIS — Z95811 Presence of heart assist device: Secondary | ICD-10-CM

## 2016-06-07 DIAGNOSIS — Z5181 Encounter for therapeutic drug level monitoring: Secondary | ICD-10-CM

## 2016-06-07 LAB — POCT INR: INR: 2.1

## 2016-06-08 IMAGING — CR DG CHEST 1V PORT
1 series · 1 of 1 positions shown · non-contrast
Comparison: 09/22/2014.

CLINICAL DATA: Left ventricular assist device.

EXAM:
PORTABLE CHEST - 1 VIEW

[AP]
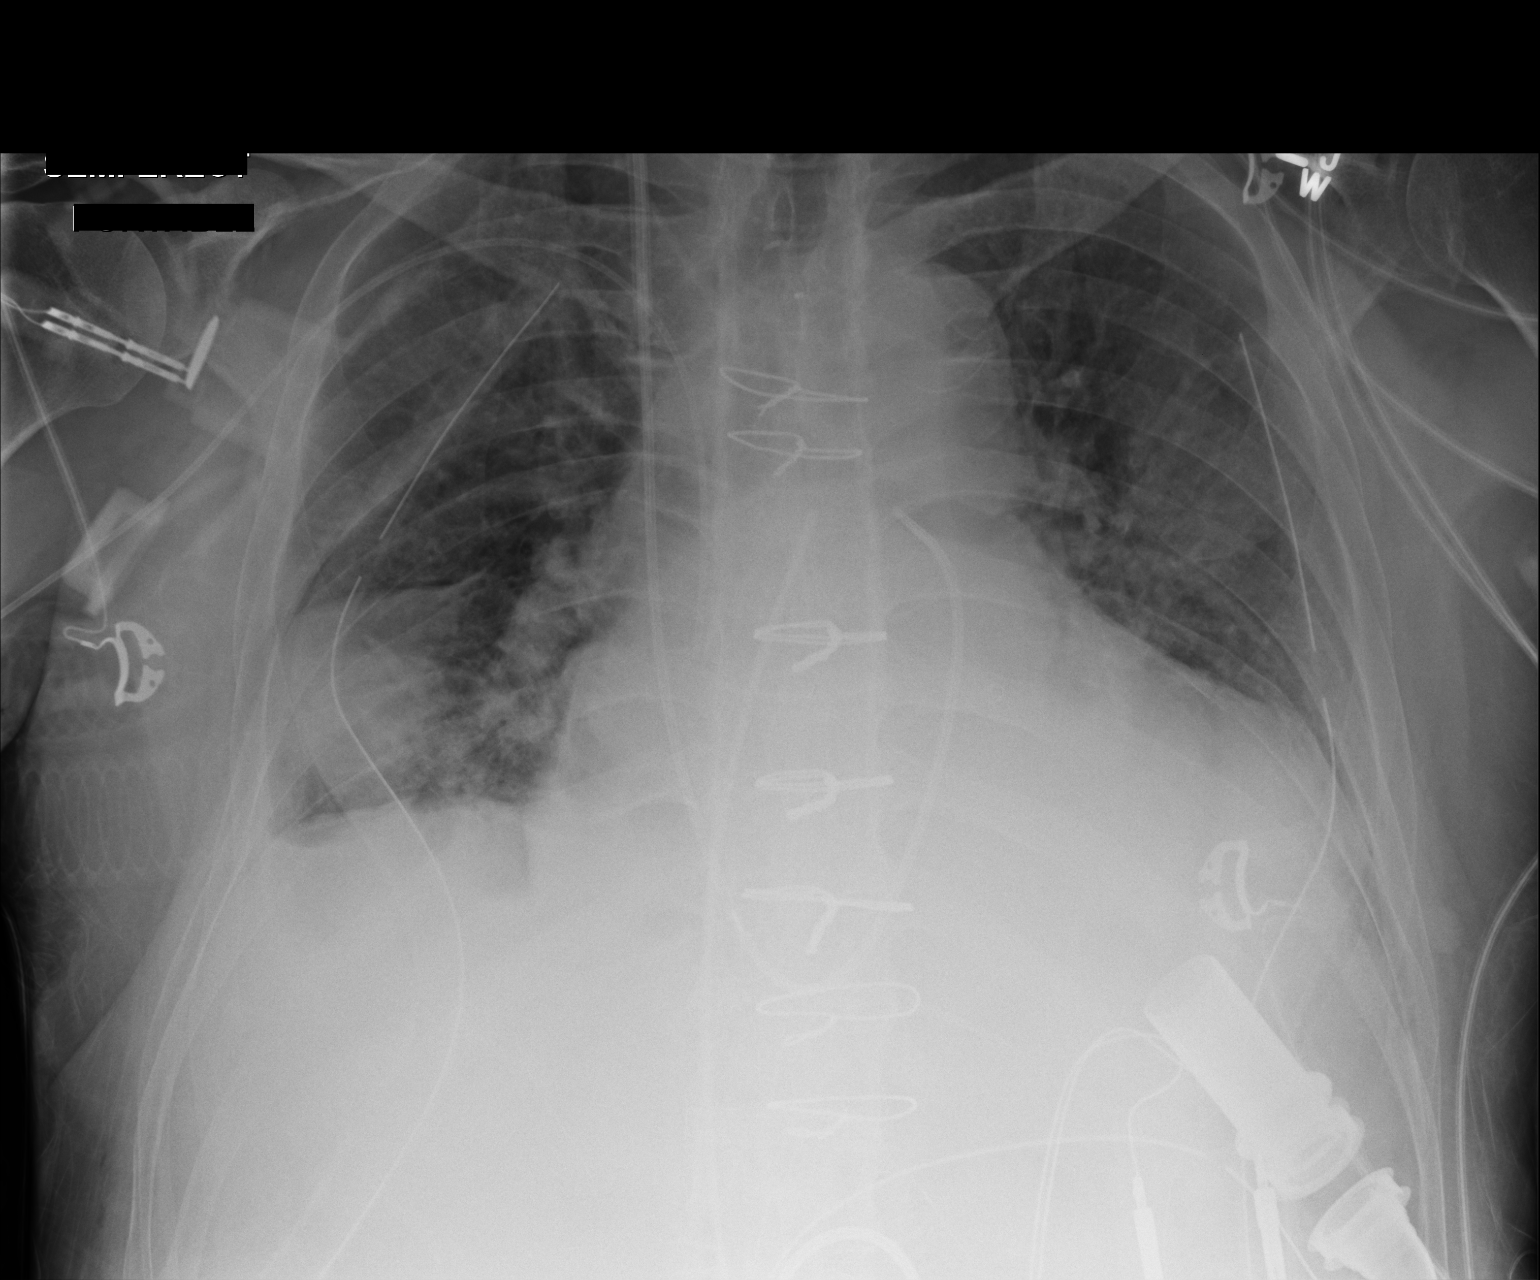

[1 of 1 positions shown; findings below may reference images not displayed]

FINDINGS: Sincere scan catheter, mediastinal drainage catheter, right PICC line,
bilateral chest tubes in stable position. Left ventricular assist
device in stable position. Cardiomegaly. Interim improvement of
pulmonary vascular prominence and interstitial prominence suggesting
improving congestive heart failure. Small bilateral pleural
effusions with fissural fluid on the right. Bibasilar atelectasis.
No pneumothorax.
IMPRESSION: 1. Lines and tubes including bilateral chest tubes in stable
position. Left ventricular assist device in stable position. No
pneumothorax.
2. Interim partial clearing of congestive heart failure and
pulmonary interstitial edema. Persistent small pleural effusions
with fissural fluid on the right again noted.

## 2016-06-09 IMAGING — CR DG CHEST 1V PORT
1 series · 1 of 1 positions shown · non-contrast
Comparison: 09/23/2014

CLINICAL DATA: Left ventricular assist device

EXAM:
PORTABLE CHEST - 1 VIEW

[portable]
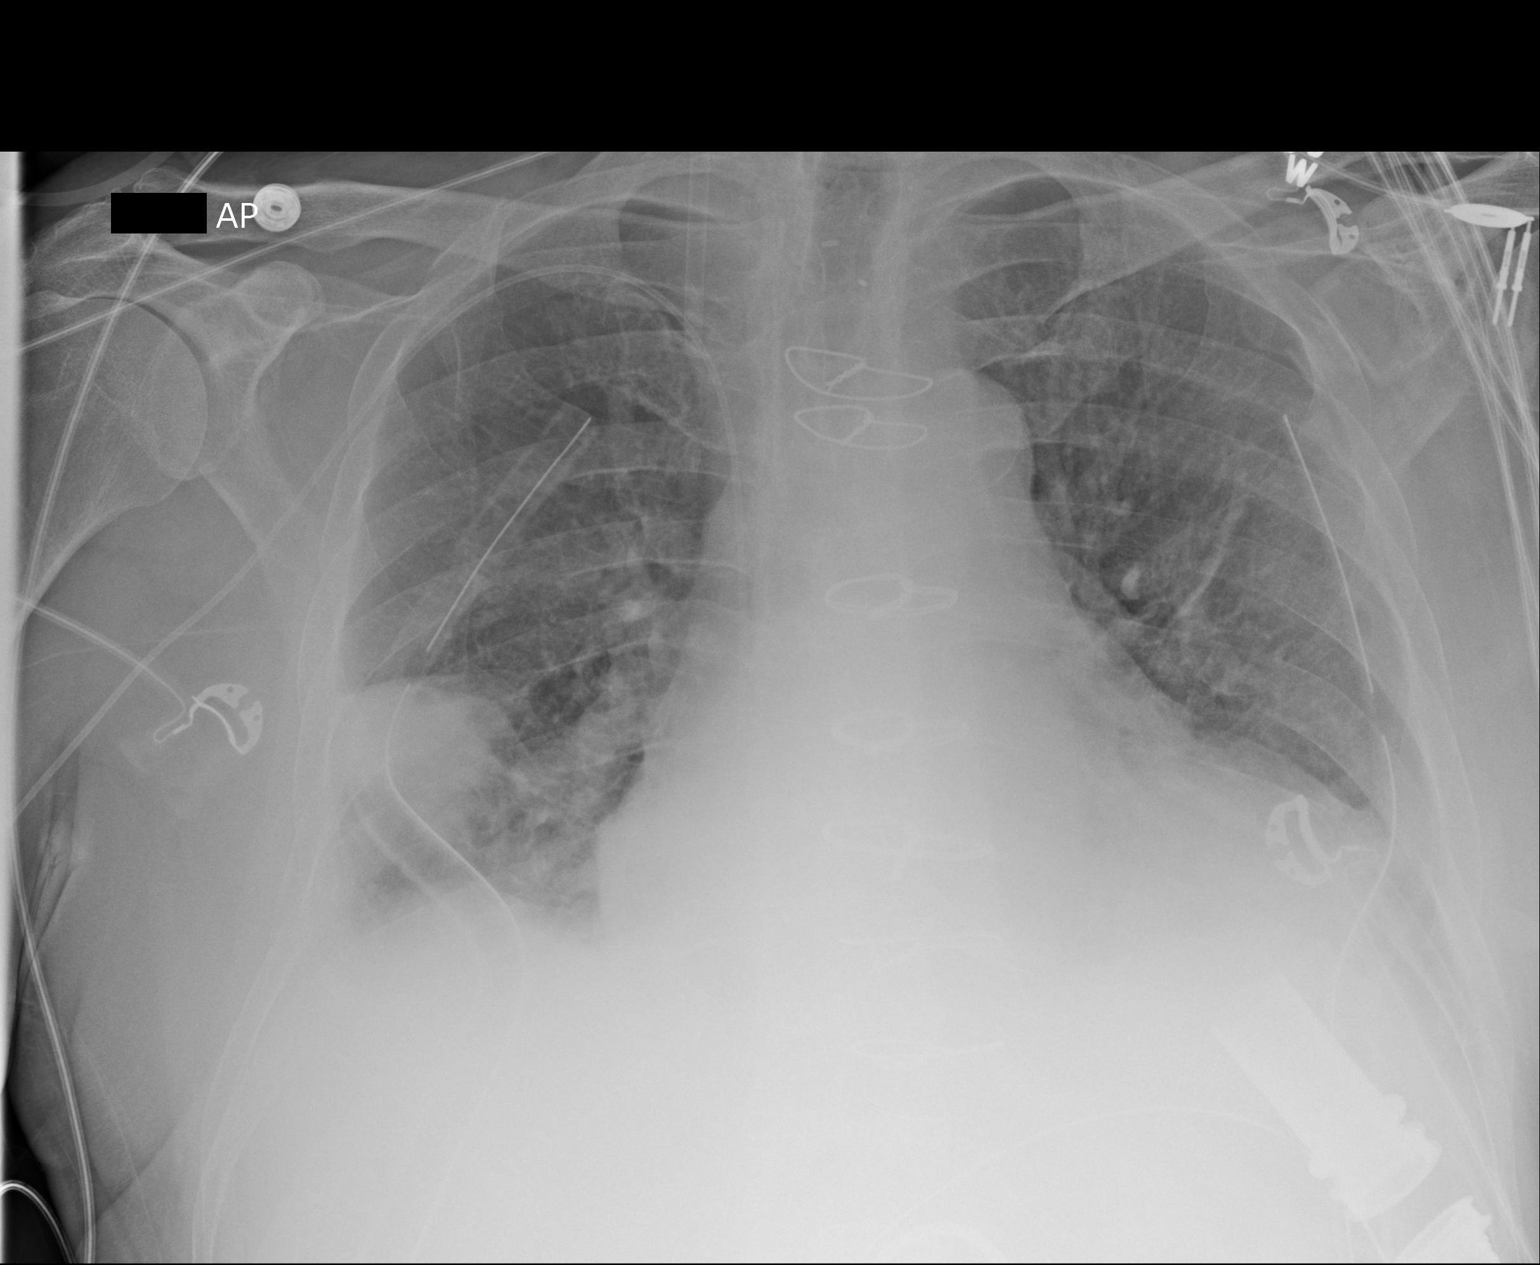

[1 of 1 positions shown; findings below may reference images not displayed]

FINDINGS: Cardiac shadow remains enlarged. An LVAD is again identified and
stable. Swan-Ganz catheter has been removed in the interval as has
the mediastinal drain. Thoracostomy catheters remain bilaterally as
does a right-sided PICC line and right jugular sheath. Rounded area
of density is noted laterally on the right stable from the prior
exam and possibly representing some fluid within the fissures. No
pneumothorax is seen. No focal infiltrate is noted.
IMPRESSION: Removal of the mediastinal drain when compared with the prior exam.

Stable density in the lateral right lung base. No new focal
abnormality is seen.

## 2016-06-10 IMAGING — CR DG CHEST 1V PORT
1 series · 1 of 1 positions shown · non-contrast
Comparison: September 24, 2014

CLINICAL DATA: Cardiomyopathy.  Recent pneumonia

EXAM:
PORTABLE CHEST - 1 VIEW

[AP]
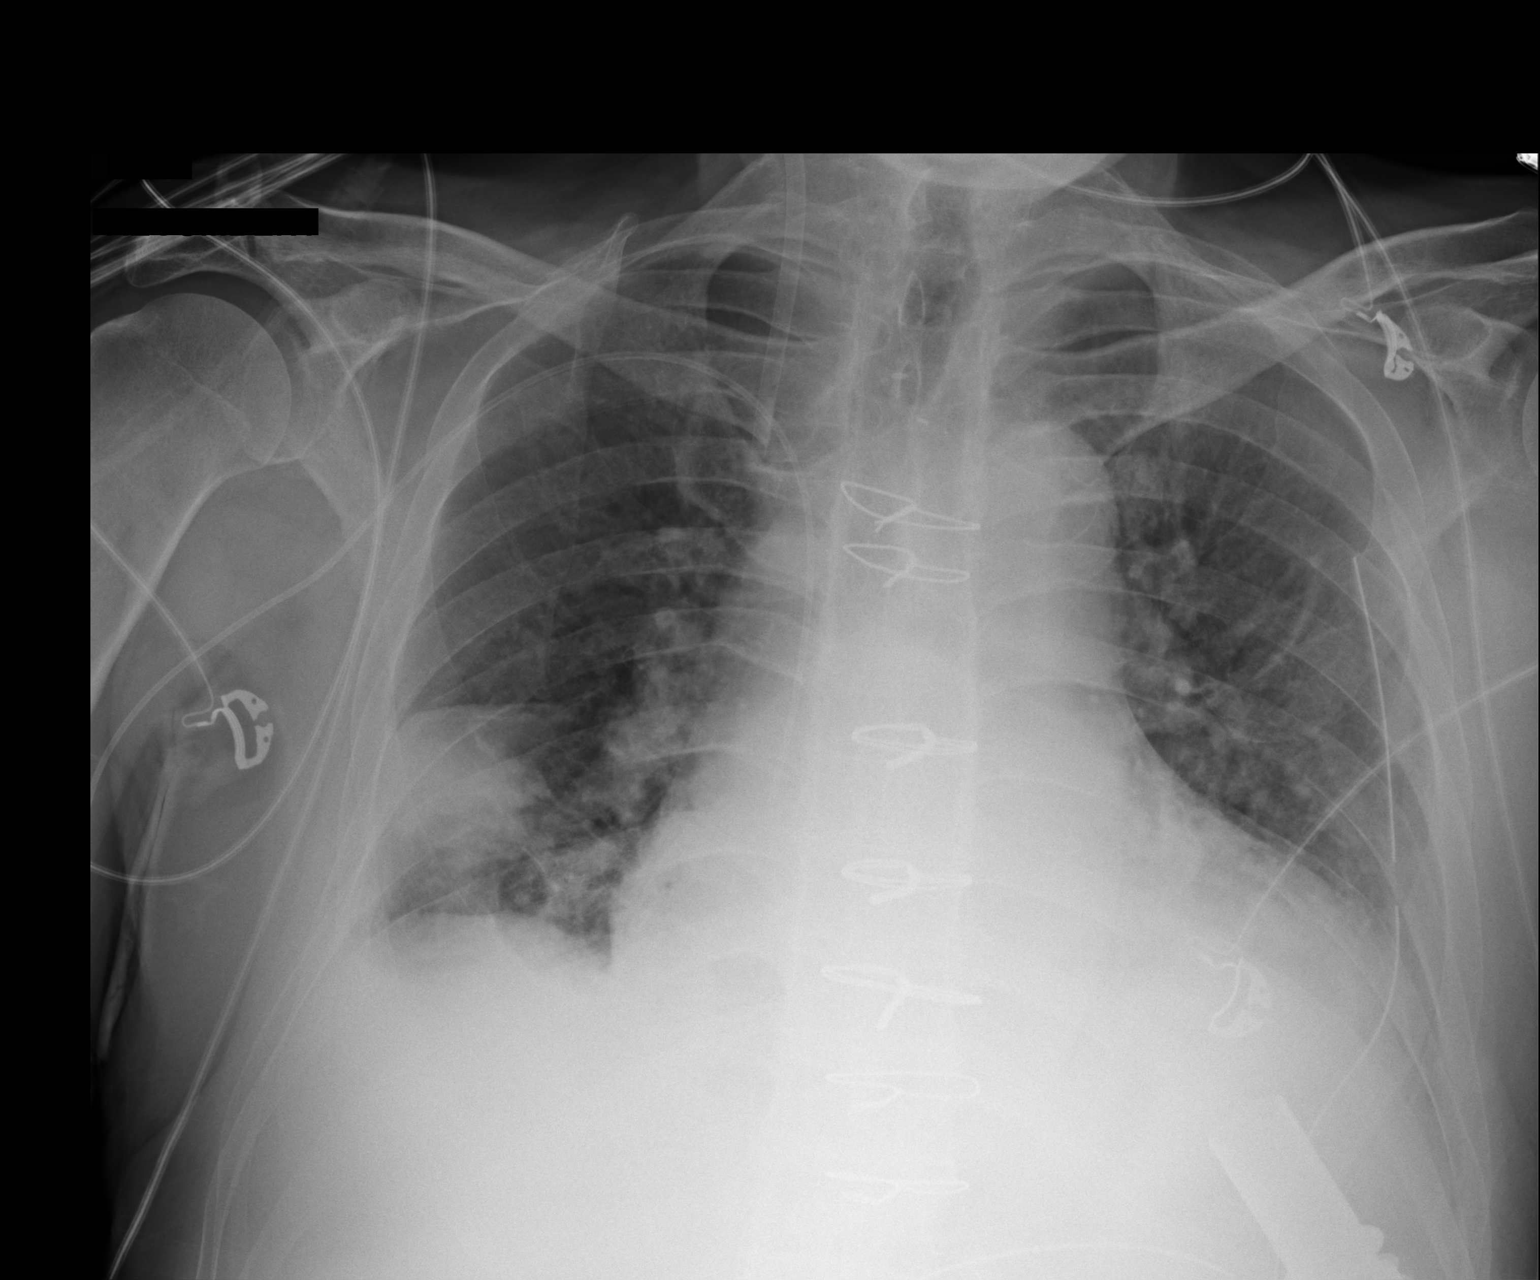

[1 of 1 positions shown; findings below may reference images not displayed]

FINDINGS: Right chest tube has been removed. Left chest tube remains.
Peripherally inserted central catheter tip is in the superior vena
cava. Cordis tip is in the superior vena cava. No pneumothorax.
There is persistent consolidation in the right lung base as well as
behind the left heart. There is stable cardiomegaly. There is a left
ventricular assist device present, incompletely visualized on the
current examination. No adenopathy.
IMPRESSION: Tube and catheter positions as described without pneumothorax.
Bibasilar consolidation, stable. No change in cardiomegaly.

## 2016-06-12 IMAGING — CR DG CHEST 1V PORT
1 series · 1 of 1 positions shown · non-contrast
Comparison: Portable chest x-ray September 26, 2014

CLINICAL DATA: Left ventricular assist device, chronic CHF,
elevated white blood cell count.

EXAM:
PORTABLE CHEST - 1 VIEW

[AP]
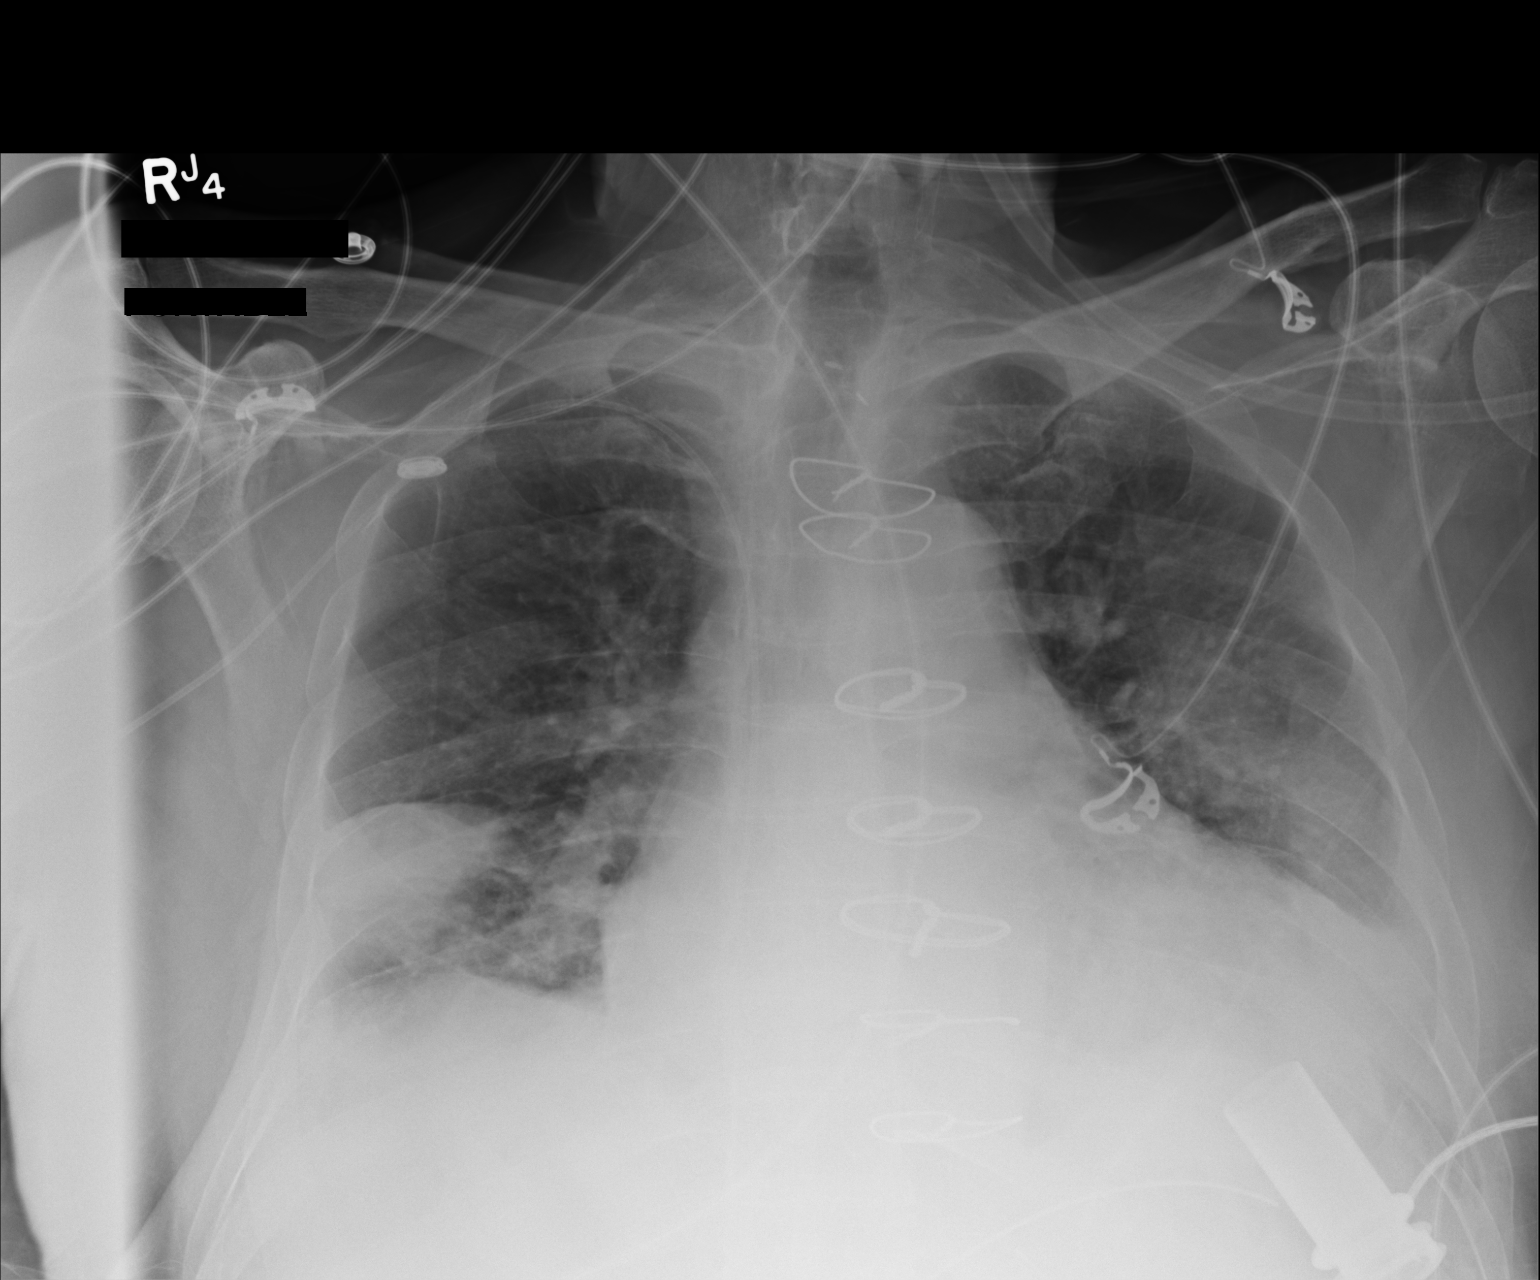

[1 of 1 positions shown; findings below may reference images not displayed]

FINDINGS: The cardiopericardial silhouette remains enlarged. A small portion
of the left ventricular assist device is visible over the left
heart. The pulmonary interstitial markings are mildly prominent but
less conspicuous than on yesterday's study. There remain small
bilateral pleural effusions with pleural fluid in the minor fissure.
The PICC line tip projects over the cavoatrial junction on the
right. There are 7 intact sternal wires.
IMPRESSION: 1. Slight interval improvement in the appearance of the pulmonary
interstitium bilaterally. There are persistent bilateral pleural
effusions.
2. Persistent enlargement of cardiac silhouette. The left
ventricular device is present. The pulmonary vascularity is not
significantly engorged.
3. Given the findings on the CT scan [REDACTED] in the right
lower hemi thorax suggesting possible empyema, follow-up chest CT
scanning would be useful.

## 2016-06-14 ENCOUNTER — Ambulatory Visit (HOSPITAL_COMMUNITY): Payer: Self-pay | Admitting: Pharmacist

## 2016-06-14 DIAGNOSIS — Z95811 Presence of heart assist device: Secondary | ICD-10-CM

## 2016-06-14 DIAGNOSIS — Z5181 Encounter for therapeutic drug level monitoring: Secondary | ICD-10-CM

## 2016-06-14 LAB — POCT INR: INR: 2.4

## 2016-06-21 ENCOUNTER — Ambulatory Visit (HOSPITAL_COMMUNITY): Payer: Self-pay | Admitting: Pharmacist

## 2016-06-21 DIAGNOSIS — Z95811 Presence of heart assist device: Secondary | ICD-10-CM

## 2016-06-21 DIAGNOSIS — Z5181 Encounter for therapeutic drug level monitoring: Secondary | ICD-10-CM

## 2016-06-21 DIAGNOSIS — Z7901 Long term (current) use of anticoagulants: Secondary | ICD-10-CM | POA: Diagnosis not present

## 2016-06-21 LAB — POCT INR: INR: 2.6

## 2016-06-28 ENCOUNTER — Ambulatory Visit (HOSPITAL_COMMUNITY): Payer: Self-pay | Admitting: Unknown Physician Specialty

## 2016-06-28 DIAGNOSIS — Z95811 Presence of heart assist device: Secondary | ICD-10-CM

## 2016-06-28 DIAGNOSIS — Z5181 Encounter for therapeutic drug level monitoring: Secondary | ICD-10-CM

## 2016-06-28 LAB — POCT INR: INR: 2.2

## 2016-07-05 ENCOUNTER — Ambulatory Visit (HOSPITAL_COMMUNITY): Payer: Self-pay | Admitting: Pharmacist

## 2016-07-05 DIAGNOSIS — Z5181 Encounter for therapeutic drug level monitoring: Secondary | ICD-10-CM

## 2016-07-05 DIAGNOSIS — Z95811 Presence of heart assist device: Secondary | ICD-10-CM

## 2016-07-05 LAB — POCT INR: INR: 2.6

## 2016-07-10 ENCOUNTER — Ambulatory Visit (HOSPITAL_COMMUNITY)
Admission: RE | Admit: 2016-07-10 | Discharge: 2016-07-10 | Disposition: A | Discharge status: Home / Self Care | Payer: Medicare Other | Source: Ambulatory Visit | Attending: Cardiology | Admitting: Cardiology

## 2016-07-10 ENCOUNTER — Ambulatory Visit (HOSPITAL_BASED_OUTPATIENT_CLINIC_OR_DEPARTMENT_OTHER)
Admission: RE | Admit: 2016-07-10 | Discharge: 2016-07-10 | Disposition: A | Discharge status: Home / Self Care | Payer: Medicare Other | Source: Ambulatory Visit | Attending: Internal Medicine | Admitting: Internal Medicine

## 2016-07-10 ENCOUNTER — Other Ambulatory Visit (HOSPITAL_COMMUNITY): Payer: Self-pay

## 2016-07-10 DIAGNOSIS — I252 Old myocardial infarction: Secondary | ICD-10-CM | POA: Diagnosis not present

## 2016-07-10 DIAGNOSIS — I5023 Acute on chronic systolic (congestive) heart failure: Secondary | ICD-10-CM

## 2016-07-10 DIAGNOSIS — I5022 Chronic systolic (congestive) heart failure: Secondary | ICD-10-CM

## 2016-07-10 DIAGNOSIS — G479 Sleep disorder, unspecified: Secondary | ICD-10-CM | POA: Diagnosis not present

## 2016-07-10 DIAGNOSIS — R0609 Other forms of dyspnea: Secondary | ICD-10-CM | POA: Diagnosis not present

## 2016-07-10 DIAGNOSIS — E119 Type 2 diabetes mellitus without complications: Secondary | ICD-10-CM | POA: Diagnosis not present

## 2016-07-10 DIAGNOSIS — Z8249 Family history of ischemic heart disease and other diseases of the circulatory system: Secondary | ICD-10-CM | POA: Insufficient documentation

## 2016-07-10 DIAGNOSIS — Z95811 Presence of heart assist device: Secondary | ICD-10-CM

## 2016-07-10 DIAGNOSIS — Z86718 Personal history of other venous thrombosis and embolism: Secondary | ICD-10-CM | POA: Insufficient documentation

## 2016-07-10 DIAGNOSIS — I2510 Atherosclerotic heart disease of native coronary artery without angina pectoris: Secondary | ICD-10-CM | POA: Insufficient documentation

## 2016-07-10 DIAGNOSIS — Z86711 Personal history of pulmonary embolism: Secondary | ICD-10-CM | POA: Diagnosis not present

## 2016-07-10 DIAGNOSIS — I420 Dilated cardiomyopathy: Secondary | ICD-10-CM | POA: Insufficient documentation

## 2016-07-10 DIAGNOSIS — I110 Hypertensive heart disease with heart failure: Secondary | ICD-10-CM | POA: Diagnosis not present

## 2016-07-10 DIAGNOSIS — E785 Hyperlipidemia, unspecified: Secondary | ICD-10-CM | POA: Diagnosis not present

## 2016-07-10 DIAGNOSIS — I2699 Other pulmonary embolism without acute cor pulmonale: Secondary | ICD-10-CM | POA: Insufficient documentation

## 2016-07-10 DIAGNOSIS — I5041 Acute combined systolic (congestive) and diastolic (congestive) heart failure: Secondary | ICD-10-CM | POA: Diagnosis not present

## 2016-07-10 DIAGNOSIS — Z87891 Personal history of nicotine dependence: Secondary | ICD-10-CM | POA: Diagnosis not present

## 2016-07-10 DIAGNOSIS — Z9889 Other specified postprocedural states: Secondary | ICD-10-CM | POA: Diagnosis not present

## 2016-07-10 DIAGNOSIS — Z955 Presence of coronary angioplasty implant and graft: Secondary | ICD-10-CM | POA: Insufficient documentation

## 2016-07-10 DIAGNOSIS — I255 Ischemic cardiomyopathy: Secondary | ICD-10-CM | POA: Insufficient documentation

## 2016-07-10 DIAGNOSIS — Z79899 Other long term (current) drug therapy: Secondary | ICD-10-CM

## 2016-07-10 DIAGNOSIS — Z7901 Long term (current) use of anticoagulants: Secondary | ICD-10-CM | POA: Diagnosis not present

## 2016-07-10 DIAGNOSIS — I480 Paroxysmal atrial fibrillation: Secondary | ICD-10-CM

## 2016-07-10 LAB — CBC
HEMATOCRIT: 39.5 % (ref 39.0–52.0)
Hemoglobin: 12.8 g/dL — ABNORMAL LOW (ref 13.0–17.0)
MCH: 28.3 pg (ref 26.0–34.0)
MCHC: 32.4 g/dL (ref 30.0–36.0)
MCV: 87.2 fL (ref 78.0–100.0)
Platelets: 227 10*3/uL (ref 150–400)
RBC: 4.53 MIL/uL (ref 4.22–5.81)
RDW: 15.7 % — AB (ref 11.5–15.5)
WBC: 8.7 10*3/uL (ref 4.0–10.5)

## 2016-07-10 LAB — BASIC METABOLIC PANEL
Anion gap: 6 (ref 5–15)
BUN: 9 mg/dL (ref 6–20)
CHLORIDE: 106 mmol/L (ref 101–111)
CO2: 27 mmol/L (ref 22–32)
Calcium: 9.4 mg/dL (ref 8.9–10.3)
Creatinine, Ser: 1.17 mg/dL (ref 0.61–1.24)
GFR calc Af Amer: >60 mL/min (ref >60)
GFR calc non Af Amer: >60 mL/min (ref >60)
Glucose, Bld: 115 mg/dL — ABNORMAL HIGH (ref 65–99)
POTASSIUM: 4.3 mmol/L (ref 3.5–5.1)
Sodium: 139 mmol/L (ref 135–145)

## 2016-07-10 LAB — LIPID PANEL
Cholesterol: 155 mg/dL (ref 0–200)
HDL: 48 mg/dL (ref >40)
LDL CALC: 98 mg/dL (ref 0–99)
Total CHOL/HDL Ratio: 3.2 RATIO
Triglycerides: 47 mg/dL (ref <150)
VLDL: 9 mg/dL (ref 0–40)

## 2016-07-10 LAB — LACTATE DEHYDROGENASE: LDH: 296 U/L — AB (ref 98–192)

## 2016-07-10 MED ORDER — CYCLOBENZAPRINE HCL 5 MG PO TABS: 5.0000 mg | ORAL_TABLET | Freq: Three times a day (TID) | ORAL | 1 refills | Status: DC | PRN

## 2016-07-10 MED ORDER — PERFLUTREN LIPID MICROSPHERE
1.0000 mL | INTRAVENOUS | Status: AC | PRN
  Administered 2016-07-10: 3 mL via INTRAVENOUS
  Filled 2016-07-10: qty 10

## 2016-07-10 MED ORDER — ROSUVASTATIN CALCIUM 10 MG PO TABS: 10.0000 mg | ORAL_TABLET | Freq: Every day | ORAL | 3 refills | Status: DC

## 2016-07-10 MED ORDER — WARFARIN SODIUM 6 MG PO TABS: 6.0000 mg | ORAL_TABLET | Freq: Every day | ORAL | 3 refills | Status: DC

## 2016-07-10 MED ORDER — FUROSEMIDE 20 MG PO TABS: ORAL_TABLET | ORAL | 3 refills | Status: DC

## 2016-07-10 MED ORDER — POTASSIUM CHLORIDE CRYS ER 20 MEQ PO TBCR: 20.0000 meq | EXTENDED_RELEASE_TABLET | ORAL | 3 refills | Status: DC

## 2016-07-10 NOTE — Progress Notes (Signed)
  Echocardiogram 2D Echocardiogram with definity has been performed.  Bobbye Charleston 07/10/2016, 10:04 AM

## 2016-07-10 NOTE — Progress Notes (Signed)
Patient presents for 2 month follow up with echocardiogram and labwork (fasting lipid, LDH, BMET, CBC) in [facility] today. Reports no problems with VAD equipment or concerns with drive line.  Vital Signs:  Doppler Pressure: 88 Automatc BP: 111/79 (96) HR: 104 SPO2: 97 % Weight: 195 lbs (with batteries and controller) Last weight: 192.8 (07/03/2016) BMI today: 29.32   VAD interrogation & Equipment Management: Speed: 9200 Flow: 4.5 Power: 5.1 w  PI: 6.7 Alarms: None Events: <5 per day Fixed speed: 9200 Low speed limit: 8600 Primary Controller: Replace back up battery in 9 months.  I reviewed the LVAD parameters from today and compared the results to the patient's prior recorded data. LVAD interrogation was NEGATIVE for significant power changes, NEGATIVE for clinical alarms and STABLE for PI events/speed drops. No programming changes were made and pump is functioning within specified parameters. Pt is performing daily controller and system monitor self tests along with completing weekly and monthly maintenance for LVAD equipment.  LVAD equipment check completed and is in good working order. Back-up equipment present. Charged back up battery and performed self-test on equipment.   Exit Site Care: Drive line is being maintained daily by wife. Dressing was not changed during clinic visit as wife performed dressing change before visit this morning.  Dressing dry and intact.  Stabilization device present and accurately applied. Pt denies fever or chills. Pt states they have adequate dressing supplies at home.    Labs:  Labs (7/16): K 4.7, creatinine 1.02, LDH 301, INR 1.97, HCT 38.2 Labs (10/16): HCT 42.8 Labs (3/17): BNP 371, HCT 34.5, LDH 251, creatinine 0.97 Labs (07/10/2016): fasting lipids WNL, WBC 8.7, H/H 12.8/39.5, platelets 227, K 4.3, Cr 1.17, LDH 296  Symptom Yes No Details  Angina  X Activity:  Claudication  X How far:  Syncope  X When:  Stroke  X   Orthopnea  X How  many pillows:  PND  X How often:  CPAP  X How many hrs:  Pedal edema  X   Abd fullness  X   N&V  X   Diaphoresis  X When:  Bleeding  X   Urine  X clear yellow, adequate UOP  SOB  X Activity:  Palpitations  X When:  ICD shock  X   Hospitlizaitons  X When/where/why:  ED visit  X When/where/why:  Other MD  X When/who/why:  Activity     Fluid     Diet      Encounter Details: No changes to medication today.  Refills for lasix, potassium ,crestor, coumadin 6 mg tablet, flexeril [facility]  Patient supplied with 8 daily kits and 8 anchors.  Medication changes at this encounter: No medication changes.  Plan of care: Follow up 2 months.

## 2016-07-10 NOTE — Patient Instructions (Addendum)
[facility] lasix, crestor, flexeril, potassium, coumadin.  No changes to medication today.  Follow up 2 months.

## 2016-07-11 ENCOUNTER — Encounter (HOSPITAL_COMMUNITY): Payer: Self-pay

## 2016-07-12 ENCOUNTER — Ambulatory Visit (HOSPITAL_COMMUNITY): Payer: Self-pay | Admitting: Pharmacist

## 2016-07-12 DIAGNOSIS — Z5181 Encounter for therapeutic drug level monitoring: Secondary | ICD-10-CM

## 2016-07-12 DIAGNOSIS — Z95811 Presence of heart assist device: Secondary | ICD-10-CM

## 2016-07-12 LAB — POCT INR: INR: 3.5

## 2016-07-14 NOTE — Progress Notes (Signed)
Patient ID: [name], male   DOB: [birth year], 71 y.o.   MRN: [id number]   VAD CLINIC NOTE  Primary Cardiologist: Dr Haroldine Laws INR : [facility] [place]  HPI: Scott [name] is a 71 y/o with h/o CAD with ankylosing spondylitis, DM2, carotid stenting, severe ischemic CM s/p anterior STEM with VT arrest in 12/15, large PE with pulmonary infarct in 2/15 s/p IVC filter and systolic HF with EF 123456.  He underwent HM II LVAD placement on 09/20/13. He had a prolonged hospital course due to recurrent chest bleeding, severe CO2 retention and RV failure.  Follow-up Presents today for follow-up. Here with his wife. He continues to do very well. Remains active. Denies CP. Chronic dyspnea with moderate exertion but this is unchanged. No significant edema. No orthopnea or PND.Cotninues with ankylosing spondylitis pain but tolerates well. Appetite good. Weight stable. Takes furosemide 40 alternating with 20 daily. Changes dressing on a daily basis.  No bleeding, fevers, chills, melena, dark urine. Performing self test every day. Wife doing dressing changes.   6MW recently: 1110 feet.   Labs (7/16): K 4.7, creatinine 1.02, LDH 301, INR 1.97, HCT 38.2 Labs (10/16): HCT 42.8 Labs (3/17): BNP 371, HCT 34.5, LDH 251, creatinine 0.97   VAD interrogation & Equipment Management: Speed: 9200 Flow: 4.5 Power: 5.1 w  PI: 6.7 Alarms: None Events: <5 per day Fixed speed: 9200 Low speed limit: 8600 Primary Controller: Replace back up battery in 9 months.  LVAD interrogation negative for power spikes and speed drops/PI events. No alarms recorded on event log and none reported by patient/caregiver. Back up equipment is present. No adjustments were made to prescribed settings. Pt/caregiver deny any alarms or VAD equipment issues.   LVAD equipment check completed and is in good working order. Back-up equipment present. LVAD education done on emergency procedures and precautions and reviewed exit site care.   SH:    Social History   Social History  . Marital status: Married    Spouse name: N/A  . Number of children: N/A  . Years of education: N/A   Occupational History  . Not on file.   Social History Main Topics  . Smoking status: Former Smoker    Packs/day: 0.30    Years: 0.00    Types: Cigarettes    Quit date: 06/12/1969  . 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  . Alcohol use No  . Drug use: No  . Sexual activity: Not Currently   Other Topics Concern  . Not on file   Social History Narrative  . No narrative on file    FH:  Family History  Problem Relation Age of Onset  . Hypertension Father   . Heart attack Father   . Heart Problems Sister     Past Medical History:  Diagnosis Date  . Acute on chronic respiratory failure ([place])    a. 08/2014 in setting of PE.  Marland Kitchen Ankylosing spondylitis ([place])   . Bilateral pulmonary embolism ([place])    a. 08/2014 - started on Coumadin. Retrievable IVC filter placed 08/27/14 due to RV strain and large clot burden.  Marland Kitchen CAD (coronary artery disease)    a. stenting of LCx 2013; b. STEMI 06/12/14 s/p PCI to LAD complicated by post cath shock requiring IABP; VT s/p DCCV, EF 20%; c. NSTEMI 06/26/14 treated medically.  . Carotid artery disease ([place])    a. s/p stenting.  . Chronic systolic CHF (congestive  heart failure) ([place])   . Diabetes ([place])   . Hemoptysis    a. 08/2014 possibly due to pulm infarct/PE.  Marland Kitchen Hyperlipidemia   . Hypertension   . Hypotension   . Ischemic cardiomyopathy    a. echo 08/23/2014 EF <20%, dilated CM, mod MR/TR  . Leukocytosis   . Leukocytosis 09/10/2014  . Nephrolithiasis   . Pleural effusion on right 08/2014 - small  . Reactive thrombocytosis 09/10/2014  . Right leg DVT ([place])    a. 08/2014.  Marland Kitchen Ventricular tachycardia ([place])    a. 06/2014 at time of MI, s/p DCCV.    Current Outpatient Prescriptions  Medication Sig Dispense Refill  . acetaminophen (TYLENOL) 500 MG  tablet Take 1,000 mg by mouth every 6 (six) hours as needed for moderate pain.     . cyclobenzaprine (FLEXERIL) 5 MG tablet Take 1 tablet (5 mg total) by mouth 3 (three) times daily as needed for muscle spasms. 90 tablet 1  . furosemide (LASIX) 20 MG tablet Every other day alternate 20 mg (1 pill) with 40 mg (2 pills) 270 tablet 3  . pantoprazole (PROTONIX) 40 MG tablet Take 1 tablet (40 mg total) by mouth at bedtime. 90 tablet 3  . potassium chloride SA (K-DUR,KLOR-CON) 20 MEQ tablet Take 1 tablet (20 mEq total) by mouth as directed. Take 1 pill with 20 mg Lasix daily and 2 pills with 40 mg Lasix daily 180 tablet 3  . rosuvastatin (CRESTOR) 10 MG tablet Take 1 tablet (10 mg total) by mouth at bedtime. 90 tablet 3  . traMADol (ULTRAM) 50 MG tablet Take 50 mg by mouth every 12 (twelve) hours as needed.    . traZODone (DESYREL) 150 MG tablet Take 1 tablet (150 mg total) by mouth at bedtime. (Patient taking differently: Take 100 mg by mouth at bedtime. ) 90 tablet 3  . warfarin (COUMADIN) 4 MG tablet Take 4 mg daily or as directed 30 tablet 11  . warfarin (COUMADIN) 6 MG tablet Take 1 tablet (6 mg total) by mouth daily. Or as directed 90 tablet 3   No current facility-administered medications for this encounter.     Vital Signs:  Doppler Pressure: 88 Automatc BP: 111/79 (96) HR: 104 SPO2: 97 % Weight: 195 lbs (with batteries and controller) Last weight: 192.8 (07/03/2016) BMI today: 29.32  PHYSICAL EXAM: General:  Well appearing.  No resp difficulty HEENT: normal Neck: limited ROM, JVP flat  Carotids 2+ bilaterally; no bruits. No lymphadenopathy or thryomegaly appreciated. Cor: RRR + LVAD hum Lungs: clear bilaterally Abdomen: soft, nontender, nondistended. Driveline site clear Extremities: no cyanosis, clubbing, rash or edema Neuro: alert & orientedx3, cranial nerves grossly intact. Moves all 4 extremities w/o difficulty. Affect pleasant.  ASSESSMENT & PLAN: 1. Chronic systolic HF:  Ischemic cardiomyopathy, EF 20% s/p HMII LVAD 09/20/13.   --Continues to do very well post VAD, NYHA class II. He does not appear volume overloaded on exam.  2. CAD s/p Anterior STEMI on 06/12/14 with stenting of LAD:  --Stable. Continue statin. Off ASA due to bleeding.  3. VAD: Interrogated personally. --Parameters stable. Daily dressing changes due to high level of activity.  4. Ankylosing spondylitis: Off NSAIDS. --Now on tramadol and flexeril. Stable. Follows with Dr. Alexandria Lodge ([facility]) 5. Bilateral PE with pulmonary infarct:  --IVC filter removed.  On coumadin.  6. COPD/CO2 retention, post-op:  Improved. He is off Bipap.   7. Anticoagulation: Continue coumadin. Goal INR 1.8-2.2 due to bleeding. Off ASA. INR today 3.5 LDH stable  at 296 8. HTN:  --MAP ok. High-end of normal range. Can titrate anti-HTN as needed.  Scott [name], [place],MD 07/14/2016

## 2016-07-19 DIAGNOSIS — Z7901 Long term (current) use of anticoagulants: Secondary | ICD-10-CM | POA: Diagnosis not present

## 2016-07-19 LAB — POCT INR: INR: 3

## 2016-07-20 ENCOUNTER — Ambulatory Visit (HOSPITAL_COMMUNITY): Payer: Self-pay | Admitting: Pharmacist

## 2016-07-20 DIAGNOSIS — Z5181 Encounter for therapeutic drug level monitoring: Secondary | ICD-10-CM

## 2016-07-20 DIAGNOSIS — Z95811 Presence of heart assist device: Secondary | ICD-10-CM

## 2016-07-26 ENCOUNTER — Ambulatory Visit (HOSPITAL_COMMUNITY): Payer: Self-pay | Admitting: Pharmacist

## 2016-07-26 ENCOUNTER — Telehealth (HOSPITAL_COMMUNITY): Payer: Self-pay | Admitting: Infectious Diseases

## 2016-07-26 ENCOUNTER — Other Ambulatory Visit (HOSPITAL_COMMUNITY): Payer: Self-pay | Admitting: Pharmacist

## 2016-07-26 DIAGNOSIS — Z95811 Presence of heart assist device: Secondary | ICD-10-CM

## 2016-07-26 DIAGNOSIS — G479 Sleep disorder, unspecified: Secondary | ICD-10-CM

## 2016-07-26 DIAGNOSIS — I480 Paroxysmal atrial fibrillation: Secondary | ICD-10-CM

## 2016-07-26 DIAGNOSIS — Z79899 Other long term (current) drug therapy: Secondary | ICD-10-CM

## 2016-07-26 DIAGNOSIS — Z7901 Long term (current) use of anticoagulants: Secondary | ICD-10-CM

## 2016-07-26 DIAGNOSIS — I5023 Acute on chronic systolic (congestive) heart failure: Secondary | ICD-10-CM

## 2016-07-26 DIAGNOSIS — I5041 Acute combined systolic (congestive) and diastolic (congestive) heart failure: Secondary | ICD-10-CM

## 2016-07-26 DIAGNOSIS — R0609 Other forms of dyspnea: Secondary | ICD-10-CM

## 2016-07-26 DIAGNOSIS — Z5181 Encounter for therapeutic drug level monitoring: Secondary | ICD-10-CM

## 2016-07-26 DIAGNOSIS — I5022 Chronic systolic (congestive) heart failure: Secondary | ICD-10-CM

## 2016-07-26 LAB — POCT INR: INR: 2.5

## 2016-07-26 MED ORDER — POTASSIUM CHLORIDE CRYS ER 20 MEQ PO TBCR: EXTENDED_RELEASE_TABLET | ORAL | 3 refills | Status: DC

## 2016-07-26 MED ORDER — FUROSEMIDE 20 MG PO TABS: ORAL_TABLET | ORAL | 3 refills | Status: DC

## 2016-07-26 MED ORDER — WARFARIN SODIUM 6 MG PO TABS: 6.0000 mg | ORAL_TABLET | Freq: Every day | ORAL | 3 refills | Status: DC

## 2016-07-26 MED ORDER — ROSUVASTATIN CALCIUM 10 MG PO TABS: 10.0000 mg | ORAL_TABLET | Freq: Every day | ORAL | 3 refills | Status: DC

## 2016-07-27 ENCOUNTER — Encounter (HOSPITAL_COMMUNITY): Payer: Self-pay | Admitting: Infectious Diseases

## 2016-07-27 NOTE — Telephone Encounter (Signed)
A user error has taken place: charting done on wrong patient and has been corrected.

## 2016-07-31 ENCOUNTER — Telehealth: Payer: Self-pay | Admitting: Licensed Clinical Social Worker

## 2016-08-01 NOTE — Telephone Encounter (Signed)
CSW contacted wife to follow up on concerns raised with VAD Coordinator regarding billing issues. Wife shared recent challenges with insurance and bills dating back to 2016. Wife states she is waiting to hear back and should have some more information by next week. CSW offered assistance and wife will touch base with CSW next week if further intervention needed. Wife also shared concerns about INR results/processes and spoke with various staff members to gain clarification. CSW provided supportive listening and offered assistance when needed. CSW also reminded wife to contact VAD pager 24/7 if issues should arise and she is in need of help. Patient's wife verbalizes understanding and appreciative of the call. CSW continues to be available as needed. Raquel Sarna, [place], [place]

## 2016-08-02 ENCOUNTER — Ambulatory Visit (HOSPITAL_COMMUNITY): Payer: Self-pay | Admitting: Pharmacist

## 2016-08-02 DIAGNOSIS — Z5181 Encounter for therapeutic drug level monitoring: Secondary | ICD-10-CM

## 2016-08-02 DIAGNOSIS — Z95811 Presence of heart assist device: Secondary | ICD-10-CM

## 2016-08-02 LAB — POCT INR: INR: 2.6

## 2016-08-09 ENCOUNTER — Ambulatory Visit (HOSPITAL_COMMUNITY): Payer: Self-pay | Admitting: Pharmacist

## 2016-08-09 DIAGNOSIS — Z5181 Encounter for therapeutic drug level monitoring: Secondary | ICD-10-CM

## 2016-08-09 DIAGNOSIS — Z95811 Presence of heart assist device: Secondary | ICD-10-CM

## 2016-08-09 LAB — POCT INR: INR: 2.6

## 2016-08-16 ENCOUNTER — Ambulatory Visit (HOSPITAL_COMMUNITY): Payer: Self-pay | Admitting: Pharmacist

## 2016-08-16 DIAGNOSIS — Z95811 Presence of heart assist device: Secondary | ICD-10-CM

## 2016-08-16 DIAGNOSIS — Z7901 Long term (current) use of anticoagulants: Secondary | ICD-10-CM | POA: Diagnosis not present

## 2016-08-16 DIAGNOSIS — Z5181 Encounter for therapeutic drug level monitoring: Secondary | ICD-10-CM

## 2016-08-16 LAB — POCT INR: INR: 2.4

## 2016-08-23 ENCOUNTER — Ambulatory Visit (HOSPITAL_COMMUNITY): Payer: Self-pay | Admitting: Pharmacist

## 2016-08-23 DIAGNOSIS — Z5181 Encounter for therapeutic drug level monitoring: Secondary | ICD-10-CM

## 2016-08-23 DIAGNOSIS — Z95811 Presence of heart assist device: Secondary | ICD-10-CM

## 2016-08-23 LAB — POCT INR: INR: 2.6

## 2016-08-30 ENCOUNTER — Ambulatory Visit (HOSPITAL_COMMUNITY): Payer: Self-pay | Admitting: Pharmacist

## 2016-08-30 DIAGNOSIS — Z95811 Presence of heart assist device: Secondary | ICD-10-CM

## 2016-08-30 DIAGNOSIS — Z5181 Encounter for therapeutic drug level monitoring: Secondary | ICD-10-CM

## 2016-08-30 LAB — POCT INR: INR: 2.2

## 2016-09-06 ENCOUNTER — Ambulatory Visit (HOSPITAL_COMMUNITY): Payer: Self-pay | Admitting: Pharmacist

## 2016-09-06 DIAGNOSIS — Z5181 Encounter for therapeutic drug level monitoring: Secondary | ICD-10-CM

## 2016-09-06 DIAGNOSIS — Z95811 Presence of heart assist device: Secondary | ICD-10-CM

## 2016-09-06 LAB — POCT INR: INR: 2.3

## 2016-09-10 ENCOUNTER — Ambulatory Visit (HOSPITAL_COMMUNITY)
Admission: RE | Admit: 2016-09-10 | Discharge: 2016-09-10 | Disposition: A | Discharge status: Home / Self Care | Payer: Medicare Other | Source: Ambulatory Visit | Attending: Internal Medicine | Admitting: Internal Medicine

## 2016-09-10 ENCOUNTER — Ambulatory Visit (HOSPITAL_COMMUNITY): Payer: Self-pay | Admitting: *Deleted

## 2016-09-10 DIAGNOSIS — I110 Hypertensive heart disease with heart failure: Secondary | ICD-10-CM | POA: Diagnosis not present

## 2016-09-10 DIAGNOSIS — M459 Ankylosing spondylitis of unspecified sites in spine: Secondary | ICD-10-CM | POA: Diagnosis not present

## 2016-09-10 DIAGNOSIS — I252 Old myocardial infarction: Secondary | ICD-10-CM | POA: Insufficient documentation

## 2016-09-10 DIAGNOSIS — Z7901 Long term (current) use of anticoagulants: Secondary | ICD-10-CM | POA: Diagnosis not present

## 2016-09-10 DIAGNOSIS — I255 Ischemic cardiomyopathy: Secondary | ICD-10-CM | POA: Diagnosis not present

## 2016-09-10 DIAGNOSIS — Z9889 Other specified postprocedural states: Secondary | ICD-10-CM | POA: Diagnosis not present

## 2016-09-10 DIAGNOSIS — J449 Chronic obstructive pulmonary disease, unspecified: Secondary | ICD-10-CM | POA: Insufficient documentation

## 2016-09-10 DIAGNOSIS — I2699 Other pulmonary embolism without acute cor pulmonale: Secondary | ICD-10-CM | POA: Insufficient documentation

## 2016-09-10 DIAGNOSIS — N529 Male erectile dysfunction, unspecified: Secondary | ICD-10-CM | POA: Diagnosis not present

## 2016-09-10 DIAGNOSIS — E785 Hyperlipidemia, unspecified: Secondary | ICD-10-CM | POA: Insufficient documentation

## 2016-09-10 DIAGNOSIS — I420 Dilated cardiomyopathy: Secondary | ICD-10-CM | POA: Diagnosis not present

## 2016-09-10 DIAGNOSIS — Z95811 Presence of heart assist device: Secondary | ICD-10-CM | POA: Diagnosis not present

## 2016-09-10 DIAGNOSIS — Z87891 Personal history of nicotine dependence: Secondary | ICD-10-CM | POA: Insufficient documentation

## 2016-09-10 DIAGNOSIS — I2510 Atherosclerotic heart disease of native coronary artery without angina pectoris: Secondary | ICD-10-CM | POA: Diagnosis not present

## 2016-09-10 DIAGNOSIS — Z8249 Family history of ischemic heart disease and other diseases of the circulatory system: Secondary | ICD-10-CM | POA: Insufficient documentation

## 2016-09-10 DIAGNOSIS — I5022 Chronic systolic (congestive) heart failure: Secondary | ICD-10-CM | POA: Diagnosis not present

## 2016-09-10 DIAGNOSIS — Z86718 Personal history of other venous thrombosis and embolism: Secondary | ICD-10-CM | POA: Diagnosis not present

## 2016-09-10 DIAGNOSIS — Z86711 Personal history of pulmonary embolism: Secondary | ICD-10-CM | POA: Diagnosis not present

## 2016-09-10 DIAGNOSIS — Z5181 Encounter for therapeutic drug level monitoring: Secondary | ICD-10-CM

## 2016-09-10 DIAGNOSIS — E119 Type 2 diabetes mellitus without complications: Secondary | ICD-10-CM | POA: Insufficient documentation

## 2016-09-10 LAB — CBC
HCT: 42.1 % (ref 39.0–52.0)
HEMOGLOBIN: 13.5 g/dL (ref 13.0–17.0)
MCH: 28.5 pg (ref 26.0–34.0)
MCHC: 32.1 g/dL (ref 30.0–36.0)
MCV: 88.8 fL (ref 78.0–100.0)
Platelets: 226 10*3/uL (ref 150–400)
RBC: 4.74 MIL/uL (ref 4.22–5.81)
RDW: 15.8 % — ABNORMAL HIGH (ref 11.5–15.5)
WBC: 8.6 10*3/uL (ref 4.0–10.5)

## 2016-09-10 LAB — COMPREHENSIVE METABOLIC PANEL
ALK PHOS: 125 U/L (ref 38–126)
ALT: 28 U/L (ref 17–63)
AST: 37 U/L (ref 15–41)
Albumin: 3.9 g/dL (ref 3.5–5.0)
Anion gap: 6 (ref 5–15)
BUN: 16 mg/dL (ref 6–20)
CALCIUM: 9.5 mg/dL (ref 8.9–10.3)
CHLORIDE: 104 mmol/L (ref 101–111)
CO2: 27 mmol/L (ref 22–32)
CREATININE: 1.18 mg/dL (ref 0.61–1.24)
Glucose, Bld: 112 mg/dL — ABNORMAL HIGH (ref 65–99)
Potassium: 4.4 mmol/L (ref 3.5–5.1)
Sodium: 137 mmol/L (ref 135–145)
Total Bilirubin: 0.5 mg/dL (ref 0.3–1.2)
Total Protein: 8.1 g/dL (ref 6.5–8.1)

## 2016-09-10 LAB — PROTIME-INR
INR: 1.92
PROTHROMBIN TIME: 22.2 s — AB (ref 11.4–15.2)

## 2016-09-10 LAB — LACTATE DEHYDROGENASE: LDH: 293 U/L — ABNORMAL HIGH (ref 98–192)

## 2016-09-10 MED ORDER — CYCLOBENZAPRINE HCL 5 MG PO TABS: 5.0000 mg | ORAL_TABLET | Freq: Three times a day (TID) | ORAL | 0 refills | Status: DC | PRN

## 2016-09-10 MED ORDER — SILDENAFIL CITRATE 100 MG PO TABS: 100.0000 mg | ORAL_TABLET | Freq: Every day | ORAL | 3 refills | Status: DC | PRN

## 2016-09-10 NOTE — Progress Notes (Signed)
Patient presents for 2 month  follow up in [facility] today. Reports no problems with VAD equipment or concerns with drive line.  Vital Signs:  Doppler Pressure 94    Automatc BP: 122/79 (93) HR:97   SPO2:97  %  Weight: 195.4 lb w/o eqt Last weight: 195 lb Home weights: 185 lbs   VAD Indication: Destination Therapy    VAD interrogation & Equipment Management: Speed:9200 Flow: 4.3 Power:5.1 w    PI:6.5  Alarms: no clinical alarms Events: 5-10 daily PI events  Fixed speed 9200 Low speed limit: 8600  Primary Controller:  Replace back up battery in 61months. Back up controller:   Replace back up battery in 15 months.  Annual Equipment Maintenance due to be completed at next clinic visit.   I reviewed the LVAD parameters from today and compared the results to the patient's prior recorded data. LVAD interrogation was NEGATIVE for significant power changes, NEGATIVE for clinical alarms and STABLE for PI events/speed drops. No programming changes were made and pump is functioning within specified parameters. Pt is performing daily controller and system monitor self tests along with completing weekly and monthly maintenance for LVAD equipment.  LVAD equipment check completed and is in good working order. Back-up equipment present. Charged back up battery and performed self-test on equipment.   Exit Site Care: Drive line is being maintained weekly  by his wife. Drive line exit site well healed and incorporated. The velour is fully implanted at exit site. Dressing dry and intact. No erythema or drainage. Stabilization device present and accurately applied. Pt denies fever or chills. Pt states they have adequate dressing supplies at home.   Significant Events on VAD Support:  none   BP & Labs:  MAP 94 - Doppler is reflecting MAP  Hgb 13.5 - No S/S of bleeding. Specifically denies melena/BRBPR or nosebleeds.  LDH stable at 293 with established baseline of 250- 320.  Denies tea-colored urine. No power elevations noted on interrogation.   Refills given for flexeril and new prescription given for sildenafil. Plan to bring equipment at next visit for annual maintenance. 8 weekly dressing kits given to patient. Intermacs due at next clinic visit.  Return to clinic in 2 months.   Balinda Quails RN [place] Coordinator   Office: [phone] 24/7 Emergency VAD Pager: [phone]

## 2016-09-10 NOTE — Progress Notes (Signed)
Patient ID: [name], male   DOB: 1945-08-02, 71 y.o.   MRN: [id number]   VAD CLINIC NOTE  Primary Cardiologist: Dr Scott [name] INR : [facility]  HPI: Scott [name] is a 71 y/o with h/o CAD with ankylosing spondylitis, DM2, carotid stenting, severe ischemic CM s/p anterior STEM with VT arrest in 12/15, large PE with pulmonary infarct in 2/15 s/p IVC filter and systolic HF with EF 79%.  He underwent HM II LVAD placement on 09/20/13. He had a prolonged hospital course due to recurrent chest bleeding, severe CO2 retention and RV failure.  Follow-up Presents today for follow-up. Here with his wife. He continues to do very well. Remains active. Working on his cars regularly without a problem Denies CP. Very mild DOE. No significant edema. No orthopnea or PND. Cotninues with ankylosing spondylitis pain but tolerates well.  Weight stable. Takes furosemide 40 alternating with 20 daily. No problems with driveline.  No bleeding, fevers, chills, melena, dark urine. Performing self test every day. Wife doing dressing changes.   6MW recently: 1110 feet.   Labs (7/16): K 4.7, creatinine 1.02, LDH 301, INR 1.97, HCT 38.2 Labs (10/16): HCT 42.8 Labs (3/17): BNP 371, HCT 34.5, LDH 251, creatinine 0.97   VAD Indication: Destination Therapy    VAD interrogation & Equipment Management: Speed:9200 Flow: 4.3 Power:5.1 w PI:6.5  Alarms: no clinical alarms Events: 5-10 daily PI events  Fixed speed 9200 Low speed limit: 8600  Primary Controller: Replace back up battery in 13months. Back up controller: Replace back up battery in 14months.   LVAD interrogation negative for power spikes and speed drops/PI events. No alarms recorded on event log and none reported by patient/caregiver. Back up equipment is present. No adjustments were made to prescribed settings. Pt/caregiver deny any alarms or VAD equipment issues.   LVAD equipment check completed and is in good working order. Back-up  equipment present. LVAD education done on emergency procedures and precautions and reviewed exit site care.   SH:  Social History   Social History  . Marital status: Married    Spouse name: Scott [name]  . Number of children: Scott [name]  . Years of education: Scott [name]   Occupational History  . Not on file.   Social History Main Topics  . Smoking status: Former Smoker    Packs/day: 0.30    Years: 0.00    Types: Cigarettes    Quit date: 06/12/1969  . 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  . Alcohol use No  . Drug use: No  . Sexual activity: Not Currently   Other Topics Concern  . Not on file   Social History Narrative  . No narrative on file    FH:  Family History  Problem Relation Age of Onset  . Hypertension Father   . Heart attack Father   . Heart Problems Sister     Past Medical History:  Diagnosis Date  . Acute on chronic respiratory failure ([place])    a. 08/2014 in setting of PE.  Marland Kitchen Ankylosing spondylitis ([place])   . Bilateral pulmonary embolism ([place])    a. 08/2014 - started on Coumadin. Retrievable IVC filter placed 08/27/14 due to RV strain and large clot burden.  Marland Kitchen CAD (coronary artery disease)    a. stenting of LCx 2013; b. STEMI 06/12/14 s/p PCI to LAD complicated by post cath shock requiring IABP; VT s/p DCCV, EF 20%; c. NSTEMI 06/26/14 treated medically.  Marland Kitchen  Carotid artery disease ([place])    a. s/p stenting.  . Chronic systolic CHF (congestive heart failure) ([place])   . Diabetes ([place])   . Hemoptysis    a. 08/2014 possibly due to pulm infarct/PE.  Marland Kitchen Hyperlipidemia   . Hypertension   . Hypotension   . Ischemic cardiomyopathy    a. echo 08/23/2014 EF <20%, dilated CM, mod MR/TR  . Leukocytosis   . Leukocytosis 09/10/2014  . Nephrolithiasis   . Pleural effusion on right 08/2014 - small  . Reactive thrombocytosis 09/10/2014  . Right leg DVT ([place])    a. 08/2014.  Marland Kitchen Ventricular tachycardia ([place])    a. 06/2014 at time of MI, s/p  DCCV.    Current Outpatient Prescriptions  Medication Sig Dispense Refill  . acetaminophen (TYLENOL) 500 MG tablet Take 1,000 mg by mouth every 6 (six) hours as needed for moderate pain.     . clobetasol cream (TEMOVATE) 2.33 % Apply 1 application topically 2 (two) times daily.    . cyclobenzaprine (FLEXERIL) 5 MG tablet Take 1 tablet (5 mg total) by mouth 3 (three) times daily as needed for muscle spasms. 270 tablet 0  . furosemide (LASIX) 20 MG tablet Every other day alternate 20 mg (1 pill) with 40 mg (2 pills) 135 tablet 3  . pantoprazole (PROTONIX) 40 MG tablet Take 1 tablet (40 mg total) by mouth at bedtime. 90 tablet 3  . potassium chloride SA (K-DUR,KLOR-CON) 20 MEQ tablet Every other day alternate 20 mEq (1 tablet) with 40 mEq (2 tablets) 135 tablet 3  . rosuvastatin (CRESTOR) 10 MG tablet Take 1 tablet (10 mg total) by mouth at bedtime. 90 tablet 3  . traMADol (ULTRAM) 50 MG tablet Take 50 mg by mouth every 12 (twelve) hours as needed.    . traZODone (DESYREL) 150 MG tablet Take 1 tablet (150 mg total) by mouth at bedtime. (Patient taking differently: Take 100 mg by mouth at bedtime. ) 90 tablet 3  . warfarin (COUMADIN) 6 MG tablet Take 1 tablet (6 mg total) by mouth daily. Or as directed 90 tablet 3  . sildenafil (VIAGRA) 100 MG tablet Take 1 tablet (100 mg total) by mouth daily as needed for erectile dysfunction. 10 tablet 3   No current facility-administered medications for this encounter.     Vital Signs:  Doppler Pressure 94                            Automatc BP: 122/79 (93) HR:97  SPO2:97  %  Weight: 195.4 lb w/o eqt Last weight: 195 lb Home weights: 185 lbs   PHYSICAL EXAM: General:  Well appearing.  No resp difficulty. Here with his wife  HEENT: normal Neck: limited ROM, JVP flat  Carotids 2+ bilaterally; no bruits. No lymphadenopathy or thryomegaly appreciated. Cor: RRR + LVAD hum Lungs: clear bilaterally Abdomen: soft, nontender, nondistended. Driveline  site clear Extremities: no cyanosis, clubbing, rash or edema Neuro: alert & orientedx3, cranial nerves grossly intact. Moves all 4 extremities w/o difficulty. Affect pleasant.  ASSESSMENT & PLAN: 1. Chronic systolic HF: Ischemic cardiomyopathy, EF 20% s/p HMII LVAD 09/20/13.   --Continues to do very well post VAD, NYHA class I-II. Volume status looks good  2. CAD s/p Anterior STEMI on 06/12/14 with stenting of LAD:  --Stable. Continue statin. Off ASA due to bleeding.  3. VAD: Interrogated personally. --Parameters stable. Driveline site looks good.  4. Ankylosing spondylitis: Off NSAIDS. --Now on tramadol  and flexeril. Stable. Follows with Dr. Alexandria Lodge ([place]). Says pain control isn't great  But seems to be functioning well  5. Bilateral PE with pulmonary infarct:  --IVC filter removed.  On coumadin.  6. COPD/CO2 retention, post-op:  Improved. He has not needed Bipap since being in the hospital.   7. Anticoagulation: Continue coumadin. Goal INR 1.8-2.2 due to bleeding. Off ASA. INR today 1.93 LDH stable at 293 8. HTN:  --MAP ok. High-end of normal range. But tolerating well. Will not change 9. Erectile dysfunction  --Discussed trial of sildenafil. We discussed risks. We have given him prescription for 100mg  tabs. Can start with 50mg  as move 10 100 mg as needed.    Scott [name], Reice,Scott [name] 09/10/2016

## 2016-09-10 NOTE — Patient Instructions (Addendum)
Return to clinic in 2 months.  Start Sildenafil 100mg  tablets daily as needed.  Bring equipment next visit for annual maintenance.

## 2016-09-13 ENCOUNTER — Ambulatory Visit (HOSPITAL_COMMUNITY): Payer: Self-pay | Admitting: Pharmacist

## 2016-09-13 DIAGNOSIS — Z7901 Long term (current) use of anticoagulants: Secondary | ICD-10-CM | POA: Diagnosis not present

## 2016-09-13 DIAGNOSIS — Z5181 Encounter for therapeutic drug level monitoring: Secondary | ICD-10-CM

## 2016-09-13 DIAGNOSIS — Z95811 Presence of heart assist device: Secondary | ICD-10-CM

## 2016-09-13 LAB — POCT INR: INR: 2.5

## 2016-09-20 ENCOUNTER — Ambulatory Visit (HOSPITAL_COMMUNITY): Payer: Self-pay | Admitting: Pharmacist

## 2016-09-20 DIAGNOSIS — Z5181 Encounter for therapeutic drug level monitoring: Secondary | ICD-10-CM

## 2016-09-20 DIAGNOSIS — Z95811 Presence of heart assist device: Secondary | ICD-10-CM

## 2016-09-20 LAB — POCT INR: INR: 2.6

## 2016-09-27 ENCOUNTER — Ambulatory Visit (HOSPITAL_COMMUNITY): Payer: Self-pay | Admitting: Pharmacist

## 2016-09-27 DIAGNOSIS — Z95811 Presence of heart assist device: Secondary | ICD-10-CM

## 2016-09-27 DIAGNOSIS — Z5181 Encounter for therapeutic drug level monitoring: Secondary | ICD-10-CM

## 2016-09-27 LAB — POCT INR: INR: 2.6

## 2016-10-04 ENCOUNTER — Ambulatory Visit (HOSPITAL_COMMUNITY): Payer: Self-pay | Admitting: Pharmacist

## 2016-10-04 DIAGNOSIS — Z5181 Encounter for therapeutic drug level monitoring: Secondary | ICD-10-CM

## 2016-10-04 DIAGNOSIS — Z95811 Presence of heart assist device: Secondary | ICD-10-CM

## 2016-10-04 LAB — POCT INR: INR: 2.2

## 2016-10-11 ENCOUNTER — Ambulatory Visit (HOSPITAL_COMMUNITY): Payer: Self-pay | Admitting: Pharmacist

## 2016-10-11 DIAGNOSIS — Z5181 Encounter for therapeutic drug level monitoring: Secondary | ICD-10-CM

## 2016-10-11 DIAGNOSIS — Z95811 Presence of heart assist device: Secondary | ICD-10-CM

## 2016-10-11 DIAGNOSIS — Z7901 Long term (current) use of anticoagulants: Secondary | ICD-10-CM | POA: Diagnosis not present

## 2016-10-11 LAB — POCT INR: INR: 2.5

## 2016-10-18 ENCOUNTER — Ambulatory Visit (HOSPITAL_COMMUNITY): Payer: Self-pay | Admitting: Pharmacist

## 2016-10-18 DIAGNOSIS — Z95811 Presence of heart assist device: Secondary | ICD-10-CM

## 2016-10-18 DIAGNOSIS — Z5181 Encounter for therapeutic drug level monitoring: Secondary | ICD-10-CM

## 2016-10-18 LAB — POCT INR: INR: 2.4

## 2016-10-25 ENCOUNTER — Ambulatory Visit (HOSPITAL_COMMUNITY): Payer: Self-pay | Admitting: Pharmacist

## 2016-10-25 DIAGNOSIS — Z95811 Presence of heart assist device: Secondary | ICD-10-CM

## 2016-10-25 DIAGNOSIS — Z5181 Encounter for therapeutic drug level monitoring: Secondary | ICD-10-CM

## 2016-10-25 LAB — POCT INR: INR: 3.1

## 2016-11-01 ENCOUNTER — Ambulatory Visit (HOSPITAL_COMMUNITY): Payer: Self-pay | Admitting: Pharmacist

## 2016-11-01 DIAGNOSIS — Z95811 Presence of heart assist device: Secondary | ICD-10-CM

## 2016-11-01 DIAGNOSIS — Z5181 Encounter for therapeutic drug level monitoring: Secondary | ICD-10-CM

## 2016-11-01 LAB — POCT INR: INR: 2.3

## 2016-11-08 ENCOUNTER — Ambulatory Visit (HOSPITAL_COMMUNITY): Payer: Self-pay | Admitting: Pharmacist

## 2016-11-08 DIAGNOSIS — Z7901 Long term (current) use of anticoagulants: Secondary | ICD-10-CM | POA: Diagnosis not present

## 2016-11-08 DIAGNOSIS — Z5181 Encounter for therapeutic drug level monitoring: Secondary | ICD-10-CM

## 2016-11-08 DIAGNOSIS — Z95811 Presence of heart assist device: Secondary | ICD-10-CM

## 2016-11-08 LAB — POCT INR: INR: 2.6

## 2016-11-12 ENCOUNTER — Other Ambulatory Visit (HOSPITAL_COMMUNITY): Payer: Self-pay | Admitting: Unknown Physician Specialty

## 2016-11-12 ENCOUNTER — Ambulatory Visit (HOSPITAL_COMMUNITY)
Admission: RE | Admit: 2016-11-12 | Discharge: 2016-11-12 | Disposition: A | Discharge status: Home / Self Care | Payer: Medicare Other | Source: Ambulatory Visit | Attending: Cardiology | Admitting: Cardiology

## 2016-11-12 ENCOUNTER — Ambulatory Visit (HOSPITAL_COMMUNITY): Payer: Self-pay | Admitting: Pharmacist

## 2016-11-12 VITALS — BP 104/0 | HR 111 | Wt 193.8 lb

## 2016-11-12 DIAGNOSIS — Z95811 Presence of heart assist device: Secondary | ICD-10-CM

## 2016-11-12 DIAGNOSIS — E119 Type 2 diabetes mellitus without complications: Secondary | ICD-10-CM | POA: Insufficient documentation

## 2016-11-12 DIAGNOSIS — Z87891 Personal history of nicotine dependence: Secondary | ICD-10-CM | POA: Insufficient documentation

## 2016-11-12 DIAGNOSIS — Z955 Presence of coronary angioplasty implant and graft: Secondary | ICD-10-CM | POA: Diagnosis not present

## 2016-11-12 DIAGNOSIS — Z7902 Long term (current) use of antithrombotics/antiplatelets: Secondary | ICD-10-CM | POA: Insufficient documentation

## 2016-11-12 DIAGNOSIS — I10 Essential (primary) hypertension: Secondary | ICD-10-CM

## 2016-11-12 DIAGNOSIS — Z8249 Family history of ischemic heart disease and other diseases of the circulatory system: Secondary | ICD-10-CM | POA: Diagnosis not present

## 2016-11-12 DIAGNOSIS — E785 Hyperlipidemia, unspecified: Secondary | ICD-10-CM | POA: Diagnosis not present

## 2016-11-12 DIAGNOSIS — R29898 Other symptoms and signs involving the musculoskeletal system: Secondary | ICD-10-CM | POA: Diagnosis not present

## 2016-11-12 DIAGNOSIS — J449 Chronic obstructive pulmonary disease, unspecified: Secondary | ICD-10-CM | POA: Insufficient documentation

## 2016-11-12 DIAGNOSIS — Z86718 Personal history of other venous thrombosis and embolism: Secondary | ICD-10-CM | POA: Insufficient documentation

## 2016-11-12 DIAGNOSIS — I110 Hypertensive heart disease with heart failure: Secondary | ICD-10-CM | POA: Diagnosis not present

## 2016-11-12 DIAGNOSIS — E872 Acidosis: Secondary | ICD-10-CM | POA: Insufficient documentation

## 2016-11-12 DIAGNOSIS — N529 Male erectile dysfunction, unspecified: Secondary | ICD-10-CM | POA: Diagnosis not present

## 2016-11-12 DIAGNOSIS — Z7982 Long term (current) use of aspirin: Secondary | ICD-10-CM | POA: Diagnosis not present

## 2016-11-12 DIAGNOSIS — I252 Old myocardial infarction: Secondary | ICD-10-CM | POA: Diagnosis not present

## 2016-11-12 DIAGNOSIS — Z87442 Personal history of urinary calculi: Secondary | ICD-10-CM | POA: Insufficient documentation

## 2016-11-12 DIAGNOSIS — I5022 Chronic systolic (congestive) heart failure: Secondary | ICD-10-CM | POA: Diagnosis not present

## 2016-11-12 DIAGNOSIS — I420 Dilated cardiomyopathy: Secondary | ICD-10-CM | POA: Insufficient documentation

## 2016-11-12 DIAGNOSIS — I255 Ischemic cardiomyopathy: Secondary | ICD-10-CM | POA: Insufficient documentation

## 2016-11-12 DIAGNOSIS — Z86711 Personal history of pulmonary embolism: Secondary | ICD-10-CM | POA: Insufficient documentation

## 2016-11-12 DIAGNOSIS — I2699 Other pulmonary embolism without acute cor pulmonale: Secondary | ICD-10-CM | POA: Insufficient documentation

## 2016-11-12 DIAGNOSIS — Z7901 Long term (current) use of anticoagulants: Secondary | ICD-10-CM

## 2016-11-12 DIAGNOSIS — Z5181 Encounter for therapeutic drug level monitoring: Secondary | ICD-10-CM

## 2016-11-12 DIAGNOSIS — I2510 Atherosclerotic heart disease of native coronary artery without angina pectoris: Secondary | ICD-10-CM | POA: Insufficient documentation

## 2016-11-12 DIAGNOSIS — R200 Anesthesia of skin: Secondary | ICD-10-CM

## 2016-11-12 LAB — BASIC METABOLIC PANEL
Anion gap: 7 (ref 5–15)
BUN: 13 mg/dL (ref 6–20)
CALCIUM: 9.3 mg/dL (ref 8.9–10.3)
CHLORIDE: 106 mmol/L (ref 101–111)
CO2: 24 mmol/L (ref 22–32)
CREATININE: 0.98 mg/dL (ref 0.61–1.24)
GFR calc non Af Amer: >60 mL/min (ref >60)
GLUCOSE: 86 mg/dL (ref 65–99)
Potassium: 4.1 mmol/L (ref 3.5–5.1)
Sodium: 137 mmol/L (ref 135–145)

## 2016-11-12 LAB — PROTIME-INR
INR: 2.29
PROTHROMBIN TIME: 25.6 s — AB (ref 11.4–15.2)

## 2016-11-12 LAB — CBC
HCT: 41.8 % (ref 39.0–52.0)
Hemoglobin: 13.5 g/dL (ref 13.0–17.0)
MCH: 29.3 pg (ref 26.0–34.0)
MCHC: 32.3 g/dL (ref 30.0–36.0)
MCV: 90.7 fL (ref 78.0–100.0)
PLATELETS: 234 10*3/uL (ref 150–400)
RBC: 4.61 MIL/uL (ref 4.22–5.81)
RDW: 16 % — ABNORMAL HIGH (ref 11.5–15.5)
WBC: 8.9 10*3/uL (ref 4.0–10.5)

## 2016-11-12 LAB — LACTATE DEHYDROGENASE: LDH: 272 U/L — ABNORMAL HIGH (ref 98–192)

## 2016-11-12 MED ORDER — ASPIRIN EC 81 MG PO TBEC: 81.0000 mg | DELAYED_RELEASE_TABLET | Freq: Every day | ORAL | 3 refills | Status: DC

## 2016-11-12 NOTE — Progress Notes (Addendum)
Patient presents for 2 month  follow up in [facility] today. Reports no problems with VAD equipment or concerns with drive line.  Vital Signs:  Doppler Pressure 104    Automatc BP: 105/75 (91) HR:111  SPO2:98  %  Weight: 193.8b w/o eqt Last weight: 195.4 lb Home weights: 185 lbs   VAD Indication: Destination Therapy    VAD interrogation & Equipment Management: Speed:9200 Flow: 4.3 Power:5.1 w    PI:6.4  Alarms: no clinical alarms Events: 5-10 daily PI events  Fixed speed 9200 Low speed limit: 8600  Primary Controller:  Expiring today, changed in clinic. New battery is [id number] 27 months.  Back up controller:   Replace back up battery in 15 months.  Annual Equipment Maintenance completed today in clinic. Next annual maintenance May 2019. Pt/caregiver refused new pt cable. Caregiver states that she  I reviewed the LVAD parameters from today and compared the results to the patient's prior recorded data. LVAD interrogation was NEGATIVE for significant power changes, NEGATIVE for clinical alarms and STABLE for PI events/speed drops. No programming changes were made and pump is functioning within specified parameters. Pt is performing daily controller and system monitor self tests along with completing weekly and monthly maintenance for LVAD equipment.  LVAD equipment check completed and is in good working order. Back-up equipment present. Charged back up battery and performed self-test on equipment.   Exit Site Care: Drive line is being maintained weekly  by his wife. Drive line exit site well healed and incorporated. The velour is fully implanted at exit site. Dressing dry and intact. No erythema or drainage. Stabilization device present and accurately applied. Pt denies fever or chills. Pt states they have adequate dressing supplies at home. Given 8 sorbaview dressings in clinic today.   Significant Events on VAD Support:  none   BP & Labs:  MAP 91 -  Doppler is reflecting Modified systolic  Hgb 28.3 - No S/S of bleeding. Specifically denies melena/BRBPR or nosebleeds.  LDH stable at 272 with established baseline of 250- 320. Denies tea-colored urine. No power elevations noted on interrogation.   2 year Intermacs follow up completed including:  Quality of Life, KCCQ-12, and  Pt refused Neurocognitive trail making.   Pt completed 1100 feet during 6 minute walk.  Pt states that in March he was mowing the lawn and his right side went completely numb including his arm and his leg. We will schedule a head CT per Dr. Haroldine Laws.     Back up controller:  11V backup battery charged during this visit.   Plan: 1. Start ASA 81mg . 2. CT of head without contrast. Sent to [facility] for prior auth.  3. Return to clinic in 2 months.   [name] RN [place] Coordinator   Office: [phone] 24/7 Emergency VAD Pager: [phone]

## 2016-11-12 NOTE — Progress Notes (Addendum)
Patient ID: Scott [name], male   DOB: 04-Feb-1946, 71 y.o.   MRN: [id number]   VAD CLINIC NOTE  Primary Cardiologist: Dr Haroldine Laws INR : [facility]  HPI: Scott [name] is a 71 y/o with h/o CAD with ankylosing spondylitis, DM2, carotid stenting, severe ischemic CM s/p anterior STEM with VT arrest in 12/15, large PE with pulmonary infarct in 2/15 s/p IVC filter and systolic HF with EF 95%.  He underwent HM II LVAD placement on 09/20/13. He had a prolonged hospital course due to recurrent chest bleeding, severe CO2 retention and RV failure.  Follow-up Today he returns for LVAD follow up. Overall feeling ok. About 6 weeks ago he had episode or RUE/RLE weakness and the inability to move extremities on the right. Symptoms spontaneously resolved 40 minutes later. He did not seek medical attention.  INR has been therapeutic. He has an INR machine at home. The end of April he reports an episode of low flow /low PI when he was in the bathroom. Says the low flow and low PI resolved after a few minutes. Today he denies SOB/PND/Orthopnea. Active at home. Weight at home 185 pounds. Appetite good. Denies BRBPR. Denies hematuria. Wife changing driveline dressing. No fever or chills.   6MW today 1100 feet.  VAD Indication: Destination Therapy    VAD interrogation & Equipment Management: Speed: 9200 Flow: 4.3  Power:5.1  PI: 6.4   Alarms: no clinical alarms Events: 5-10 PI events   Fixed speed 9200 Low speed limit: 8600  Primary Controller: Replace back up battery in 27months. Back up controller: Replace back up battery in 26months.   LVAD interrogation negative for power spikes and speed drops/PI events. No alarms recorded on event log and none reported by patient/caregiver. Back up equipment is present. No adjustments were made to prescribed settings. Pt/caregiver deny any alarms or VAD equipment issues.   LVAD equipment check completed and is in good working order. Back-up equipment  present. LVAD education done on emergency procedures and precautions and reviewed exit site care.   SH:  Social History   Social History  . Marital status: Married    Spouse name: N/A  . Number of children: N/A  . Years of education: N/A   Occupational History  . Not on file.   Social History Main Topics  . Smoking status: Former Smoker    Packs/day: 0.30    Years: 0.00    Types: Cigarettes    Quit date: 06/12/1969  . 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  . Alcohol use No  . Drug use: No  . Sexual activity: Not Currently   Other Topics Concern  . Not on file   Social History Narrative  . No narrative on file    FH:  Family History  Problem Relation Age of Onset  . Hypertension Father   . Heart attack Father   . Heart Problems Sister     Past Medical History:  Diagnosis Date  . Acute on chronic respiratory failure ([place])    a. 08/2014 in setting of PE.  Marland Kitchen Ankylosing spondylitis ([place])   . Bilateral pulmonary embolism ([place])    a. 08/2014 - started on Coumadin. Retrievable IVC filter placed 08/27/14 due to RV strain and large clot burden.  Marland Kitchen CAD (coronary artery disease)    a. stenting of LCx 2013; b. STEMI 06/12/14 s/p PCI to LAD complicated by post cath shock requiring IABP; VT  s/p DCCV, EF 20%; c. NSTEMI 06/26/14 treated medically.  . Carotid artery disease ([place])    a. s/p stenting.  . Chronic systolic CHF (congestive heart failure) ([place])   . Diabetes ([place])   . Hemoptysis    a. 08/2014 possibly due to pulm infarct/PE.  Marland Kitchen Hyperlipidemia   . Hypertension   . Hypotension   . Ischemic cardiomyopathy    a. echo 08/23/2014 EF <20%, dilated CM, mod MR/TR  . Leukocytosis   . Leukocytosis 09/10/2014  . Nephrolithiasis   . Pleural effusion on right 08/2014 - small  . Reactive thrombocytosis 09/10/2014  . Right leg DVT ([place])    a. 08/2014.  Marland Kitchen Ventricular tachycardia ([place])    a. 06/2014 at time of MI, s/p DCCV.     Current Outpatient Prescriptions  Medication Sig Dispense Refill  . acetaminophen (TYLENOL) 500 MG tablet Take 1,000 mg by mouth every 6 (six) hours as needed for moderate pain.     . clobetasol cream (TEMOVATE) 4.00 % Apply 1 application topically 2 (two) times daily.    . cyclobenzaprine (FLEXERIL) 5 MG tablet Take 1 tablet (5 mg total) by mouth 3 (three) times daily as needed for muscle spasms. 270 tablet 0  . furosemide (LASIX) 20 MG tablet Every other day alternate 20 mg (1 pill) with 40 mg (2 pills) 135 tablet 3  . pantoprazole (PROTONIX) 40 MG tablet Take 1 tablet (40 mg total) by mouth at bedtime. 90 tablet 3  . potassium chloride SA (K-DUR,KLOR-CON) 20 MEQ tablet Every other day alternate 20 mEq (1 tablet) with 40 mEq (2 tablets) 135 tablet 3  . rosuvastatin (CRESTOR) 10 MG tablet Take 1 tablet (10 mg total) by mouth at bedtime. 90 tablet 3  . sildenafil (VIAGRA) 100 MG tablet Take 1 tablet (100 mg total) by mouth daily as needed for erectile dysfunction. 10 tablet 3  . traMADol (ULTRAM) 50 MG tablet Take 50 mg by mouth every 12 (twelve) hours as needed.    . traZODone (DESYREL) 150 MG tablet Take 1 tablet (150 mg total) by mouth at bedtime. (Patient taking differently: Take 100 mg by mouth at bedtime. ) 90 tablet 3  . warfarin (COUMADIN) 6 MG tablet Take 1 tablet (6 mg total) by mouth daily. Or as directed 90 tablet 3  . aspirin EC 81 MG tablet Take 1 tablet (81 mg total) by mouth daily. 90 tablet 3   No current facility-administered medications for this encounter.     Vital Signs:  Doppler Pressure 104                         Automatc BP: 105/75 (91)  HR:111 SPO2:98  %  Weight: 193.8 pounds  Last weight: 195.4 pounds  Home weights: 185 pounds.     Physical Exam: GENERAL: Well appearing, male who presents to clinic today in no acute distress. Wife present HEENT: normal  NECK: Supple, JVP flat .  2+ bilaterally, no bruits.  No lymphadenopathy or thyromegaly  appreciated.   CARDIAC:  Mechanical heart sounds with LVAD hum present.  LUNGS:  Clear to auscultation bilaterally.  ABDOMEN:  Soft, round, nontender, positive bowel sounds x4.     LVAD exit site: well-healed and incorporated.  Dressing dry and intact.  No erythema or drainage.  Stabilization device present and accurately applied.  Driveline dressing is being changed daily per sterile technique. EXTREMITIES:  Warm and dry, no cyanosis, clubbing, rash or edema  NEUROLOGIC:  Alert and oriented x 4.  Gait steady.  No aphasia.  No dysarthria.  Affect pleasant.      ASSESSMENT & PLAN: 1. Chronic systolic HF: Ischemic cardiomyopathy, EF 20% s/p HMII LVAD 09/20/13.  NYHA II. Volume status stable. Continue current dose of lasix.  -2. CAD s/p Anterior STEMI on 06/12/14 with stenting of LAD:  --No CP. Continue statin. Restarting aspirin with possible TIA.  3. VAD: Interrogated personally. -VAD parameters stable. Driveline dressing stable.  On coumadin. Now adding aspirin.  4. Ankylosing spondylitis: Off NSAIDS. --Pain controlled.  5. Bilateral PE with pulmonary infarct:  --IVC filter removed.  On coumadin.  6. COPD/CO2 retention, post-op:  Improved. He has not needed Bipap since being in the hospital.   7. Anticoagulation: Continue coumadin.  INR goal 2-2.5 Todays INR 2.29  Restart aspirin with possible TIA.  8. HTN:  --Map 91. Continue current regimen.  9. Erectile dysfunction  --Continue sildenafil as needed.   10. Transient RUE/RLE weakness-symptoms resolved.  Possible TIA. Start aspirin 81 mg daily. Continue statin. Set up for CTo f head without contrast. Will need to consider adding plavix if he has evidence of stroke.   I reviewed lab work from today. Renal function, CBC, LDH, and INR stable.  Today he was instructed to call 911 if he has further stroke like symptoms.  Follow up in 2 months.  Scott Gervase,NP-C  11/12/2016

## 2016-11-15 ENCOUNTER — Ambulatory Visit (HOSPITAL_COMMUNITY): Payer: Self-pay | Admitting: Pharmacist

## 2016-11-15 DIAGNOSIS — Z5181 Encounter for therapeutic drug level monitoring: Secondary | ICD-10-CM

## 2016-11-15 DIAGNOSIS — Z95811 Presence of heart assist device: Secondary | ICD-10-CM

## 2016-11-15 LAB — POCT INR: INR: 3.3

## 2016-11-15 NOTE — Addendum Note (Signed)
Encounter addended by: Conrad San Jon, NP on: 11/15/2016  2:49 PM<BR>    Actions taken: Sign clinical note

## 2016-11-15 NOTE — Progress Notes (Signed)
Had a long conversation with Mrs. Rowland. She is concerned that the new aspirin that was started on Monday is affecting Mr. Severa's INR. I have advised her that aspirin does not have an effect on INR. Because of the possible TIA that Mr. Sobolewski recently had, he needs to continue to take aspirin and follow my recommendations for a reduced dose of warfarin today. She will recheck his INR on Monday.   Ruta Hinds. Velva Harman, PharmD, BCPS, CPP Clinical Pharmacist Pager: [phone] Phone: [phone] 11/15/2016 2:54 PM

## 2016-11-16 ENCOUNTER — Ambulatory Visit (HOSPITAL_COMMUNITY)
Admission: RE | Admit: 2016-11-16 | Discharge: 2016-11-16 | Disposition: A | Discharge status: Home / Self Care | Payer: Medicare Other | Source: Ambulatory Visit | Attending: Adult Health | Admitting: Adult Health

## 2016-11-16 DIAGNOSIS — I63412 Cerebral infarction due to embolism of left middle cerebral artery: Secondary | ICD-10-CM | POA: Diagnosis not present

## 2016-11-16 DIAGNOSIS — I639 Cerebral infarction, unspecified: Secondary | ICD-10-CM | POA: Insufficient documentation

## 2016-11-16 DIAGNOSIS — Z95811 Presence of heart assist device: Secondary | ICD-10-CM

## 2016-11-16 DIAGNOSIS — R200 Anesthesia of skin: Secondary | ICD-10-CM

## 2016-11-19 ENCOUNTER — Ambulatory Visit (HOSPITAL_COMMUNITY): Payer: Self-pay | Admitting: Pharmacist

## 2016-11-19 DIAGNOSIS — Z95811 Presence of heart assist device: Secondary | ICD-10-CM

## 2016-11-19 DIAGNOSIS — Z5181 Encounter for therapeutic drug level monitoring: Secondary | ICD-10-CM

## 2016-11-19 LAB — POCT INR: INR: 2.4

## 2016-11-21 ENCOUNTER — Other Ambulatory Visit (HOSPITAL_COMMUNITY): Payer: Self-pay | Admitting: *Deleted

## 2016-11-21 DIAGNOSIS — I639 Cerebral infarction, unspecified: Secondary | ICD-10-CM

## 2016-11-22 ENCOUNTER — Other Ambulatory Visit (HOSPITAL_COMMUNITY): Payer: Self-pay | Admitting: *Deleted

## 2016-11-22 ENCOUNTER — Ambulatory Visit (HOSPITAL_COMMUNITY): Payer: Self-pay | Admitting: Pharmacist

## 2016-11-22 DIAGNOSIS — Z95811 Presence of heart assist device: Secondary | ICD-10-CM

## 2016-11-22 DIAGNOSIS — Z5181 Encounter for therapeutic drug level monitoring: Secondary | ICD-10-CM

## 2016-11-22 LAB — POCT INR: INR: 2.7

## 2016-11-23 ENCOUNTER — Ambulatory Visit (HOSPITAL_COMMUNITY): Admission: RE | Admit: 2016-11-23 | Payer: Medicare Other | Source: Ambulatory Visit

## 2016-11-23 ENCOUNTER — Ambulatory Visit (HOSPITAL_COMMUNITY)
Admission: RE | Admit: 2016-11-23 | Discharge: 2016-11-23 | Disposition: A | Discharge status: Home / Self Care | Payer: Medicare Other | Source: Ambulatory Visit | Attending: Internal Medicine | Admitting: Internal Medicine

## 2016-11-23 ENCOUNTER — Ambulatory Visit (HOSPITAL_COMMUNITY): Payer: Medicare Other

## 2016-11-23 ENCOUNTER — Other Ambulatory Visit (HOSPITAL_COMMUNITY): Payer: Self-pay

## 2016-11-23 ENCOUNTER — Other Ambulatory Visit: Payer: Self-pay | Admitting: Internal Medicine

## 2016-11-23 ENCOUNTER — Other Ambulatory Visit (HOSPITAL_COMMUNITY): Payer: Self-pay | Admitting: *Deleted

## 2016-11-23 DIAGNOSIS — J0130 Acute sphenoidal sinusitis, unspecified: Secondary | ICD-10-CM | POA: Diagnosis not present

## 2016-11-23 DIAGNOSIS — M459 Ankylosing spondylitis of unspecified sites in spine: Secondary | ICD-10-CM | POA: Diagnosis not present

## 2016-11-23 DIAGNOSIS — Z09 Encounter for follow-up examination after completed treatment for conditions other than malignant neoplasm: Secondary | ICD-10-CM | POA: Insufficient documentation

## 2016-11-23 DIAGNOSIS — I63532 Cerebral infarction due to unspecified occlusion or stenosis of left posterior cerebral artery: Secondary | ICD-10-CM | POA: Diagnosis not present

## 2016-11-23 DIAGNOSIS — J323 Chronic sphenoidal sinusitis: Secondary | ICD-10-CM | POA: Insufficient documentation

## 2016-11-23 DIAGNOSIS — R930 Abnormal findings on diagnostic imaging of skull and head, not elsewhere classified: Secondary | ICD-10-CM

## 2016-11-23 DIAGNOSIS — G9389 Other specified disorders of brain: Secondary | ICD-10-CM | POA: Diagnosis not present

## 2016-11-23 DIAGNOSIS — I70209 Unspecified atherosclerosis of native arteries of extremities, unspecified extremity: Secondary | ICD-10-CM | POA: Diagnosis not present

## 2016-11-23 MED ORDER — IOPAMIDOL (ISOVUE-370) INJECTION 76%
INTRAVENOUS | Status: AC
  Administered 2016-11-23: 50 mL via INTRAVENOUS
  Filled 2016-11-23: qty 50

## 2016-11-29 ENCOUNTER — Ambulatory Visit (HOSPITAL_COMMUNITY): Payer: Self-pay | Admitting: Pharmacist

## 2016-11-29 DIAGNOSIS — Z95811 Presence of heart assist device: Secondary | ICD-10-CM

## 2016-11-29 DIAGNOSIS — Z5181 Encounter for therapeutic drug level monitoring: Secondary | ICD-10-CM

## 2016-11-29 LAB — POCT INR: INR: 2.1

## 2016-12-06 ENCOUNTER — Ambulatory Visit (HOSPITAL_COMMUNITY): Payer: Self-pay | Admitting: Pharmacist

## 2016-12-06 DIAGNOSIS — Z95811 Presence of heart assist device: Secondary | ICD-10-CM

## 2016-12-06 DIAGNOSIS — Z5181 Encounter for therapeutic drug level monitoring: Secondary | ICD-10-CM

## 2016-12-06 DIAGNOSIS — Z7901 Long term (current) use of anticoagulants: Secondary | ICD-10-CM | POA: Diagnosis not present

## 2016-12-06 LAB — POCT INR: INR: 2.7

## 2016-12-13 ENCOUNTER — Ambulatory Visit (HOSPITAL_COMMUNITY): Payer: Self-pay | Admitting: Pharmacist

## 2016-12-13 ENCOUNTER — Telehealth (HOSPITAL_COMMUNITY): Payer: Self-pay | Admitting: Pharmacist

## 2016-12-13 DIAGNOSIS — Z5181 Encounter for therapeutic drug level monitoring: Secondary | ICD-10-CM

## 2016-12-13 DIAGNOSIS — Z95811 Presence of heart assist device: Secondary | ICD-10-CM

## 2016-12-13 LAB — POCT INR: INR: 2.7

## 2016-12-13 NOTE — Telephone Encounter (Signed)
Encounter opened in error

## 2016-12-20 ENCOUNTER — Ambulatory Visit (HOSPITAL_COMMUNITY): Payer: Self-pay | Admitting: Pharmacist

## 2016-12-20 DIAGNOSIS — Z95811 Presence of heart assist device: Secondary | ICD-10-CM

## 2016-12-20 DIAGNOSIS — Z5181 Encounter for therapeutic drug level monitoring: Secondary | ICD-10-CM

## 2016-12-20 LAB — POCT INR: INR: 2.6

## 2016-12-27 ENCOUNTER — Ambulatory Visit (HOSPITAL_COMMUNITY): Payer: Self-pay | Admitting: Pharmacist

## 2016-12-27 DIAGNOSIS — Z5181 Encounter for therapeutic drug level monitoring: Secondary | ICD-10-CM

## 2016-12-27 DIAGNOSIS — Z95811 Presence of heart assist device: Secondary | ICD-10-CM

## 2016-12-27 LAB — POCT INR: INR: 2.8

## 2017-01-03 ENCOUNTER — Ambulatory Visit (HOSPITAL_COMMUNITY): Payer: Self-pay | Admitting: Pharmacist

## 2017-01-03 DIAGNOSIS — Z7901 Long term (current) use of anticoagulants: Secondary | ICD-10-CM | POA: Diagnosis not present

## 2017-01-03 DIAGNOSIS — Z5181 Encounter for therapeutic drug level monitoring: Secondary | ICD-10-CM

## 2017-01-03 DIAGNOSIS — Z95811 Presence of heart assist device: Secondary | ICD-10-CM

## 2017-01-03 LAB — POCT INR: INR: 2.7

## 2017-01-10 ENCOUNTER — Ambulatory Visit (HOSPITAL_COMMUNITY): Payer: Self-pay | Admitting: Pharmacist

## 2017-01-10 DIAGNOSIS — Z95811 Presence of heart assist device: Secondary | ICD-10-CM

## 2017-01-10 DIAGNOSIS — Z5181 Encounter for therapeutic drug level monitoring: Secondary | ICD-10-CM

## 2017-01-10 LAB — POCT INR: INR: 2.6

## 2017-01-15 ENCOUNTER — Ambulatory Visit (HOSPITAL_COMMUNITY)
Admission: RE | Admit: 2017-01-15 | Discharge: 2017-01-15 | Disposition: A | Discharge status: Home / Self Care | Payer: Medicare Other | Source: Ambulatory Visit | Attending: Cardiology | Admitting: Cardiology

## 2017-01-15 ENCOUNTER — Other Ambulatory Visit (HOSPITAL_COMMUNITY): Payer: Self-pay | Admitting: Unknown Physician Specialty

## 2017-01-15 ENCOUNTER — Ambulatory Visit (HOSPITAL_COMMUNITY): Payer: Self-pay | Admitting: Pharmacist

## 2017-01-15 VITALS — BP 78/0 | HR 111 | Ht 68.0 in | Wt 192.0 lb

## 2017-01-15 DIAGNOSIS — I2699 Other pulmonary embolism without acute cor pulmonale: Secondary | ICD-10-CM | POA: Insufficient documentation

## 2017-01-15 DIAGNOSIS — I252 Old myocardial infarction: Secondary | ICD-10-CM | POA: Diagnosis not present

## 2017-01-15 DIAGNOSIS — Z5181 Encounter for therapeutic drug level monitoring: Secondary | ICD-10-CM

## 2017-01-15 DIAGNOSIS — N529 Male erectile dysfunction, unspecified: Secondary | ICD-10-CM | POA: Insufficient documentation

## 2017-01-15 DIAGNOSIS — Z86718 Personal history of other venous thrombosis and embolism: Secondary | ICD-10-CM | POA: Insufficient documentation

## 2017-01-15 DIAGNOSIS — Z95811 Presence of heart assist device: Secondary | ICD-10-CM

## 2017-01-15 DIAGNOSIS — E785 Hyperlipidemia, unspecified: Secondary | ICD-10-CM | POA: Diagnosis not present

## 2017-01-15 DIAGNOSIS — Z7982 Long term (current) use of aspirin: Secondary | ICD-10-CM | POA: Diagnosis not present

## 2017-01-15 DIAGNOSIS — G479 Sleep disorder, unspecified: Secondary | ICD-10-CM | POA: Diagnosis not present

## 2017-01-15 DIAGNOSIS — I10 Essential (primary) hypertension: Secondary | ICD-10-CM

## 2017-01-15 DIAGNOSIS — Z7901 Long term (current) use of anticoagulants: Secondary | ICD-10-CM | POA: Diagnosis not present

## 2017-01-15 DIAGNOSIS — Z87891 Personal history of nicotine dependence: Secondary | ICD-10-CM | POA: Diagnosis not present

## 2017-01-15 DIAGNOSIS — I2510 Atherosclerotic heart disease of native coronary artery without angina pectoris: Secondary | ICD-10-CM | POA: Diagnosis not present

## 2017-01-15 DIAGNOSIS — E119 Type 2 diabetes mellitus without complications: Secondary | ICD-10-CM | POA: Insufficient documentation

## 2017-01-15 DIAGNOSIS — Z86711 Personal history of pulmonary embolism: Secondary | ICD-10-CM | POA: Insufficient documentation

## 2017-01-15 DIAGNOSIS — I110 Hypertensive heart disease with heart failure: Secondary | ICD-10-CM | POA: Insufficient documentation

## 2017-01-15 DIAGNOSIS — I255 Ischemic cardiomyopathy: Secondary | ICD-10-CM | POA: Insufficient documentation

## 2017-01-15 DIAGNOSIS — J9620 Acute and chronic respiratory failure, unspecified whether with hypoxia or hypercapnia: Secondary | ICD-10-CM | POA: Insufficient documentation

## 2017-01-15 DIAGNOSIS — I5022 Chronic systolic (congestive) heart failure: Secondary | ICD-10-CM | POA: Insufficient documentation

## 2017-01-15 LAB — BASIC METABOLIC PANEL
ANION GAP: 5 (ref 5–15)
BUN: 12 mg/dL (ref 6–20)
CHLORIDE: 102 mmol/L (ref 101–111)
CO2: 28 mmol/L (ref 22–32)
Calcium: 9.2 mg/dL (ref 8.9–10.3)
Creatinine, Ser: 1 mg/dL (ref 0.61–1.24)
GFR calc non Af Amer: >60 mL/min (ref >60)
Glucose, Bld: 120 mg/dL — ABNORMAL HIGH (ref 65–99)
POTASSIUM: 4.2 mmol/L (ref 3.5–5.1)
Sodium: 135 mmol/L (ref 135–145)

## 2017-01-15 LAB — CBC
HEMATOCRIT: 40.7 % (ref 39.0–52.0)
HEMOGLOBIN: 13.4 g/dL (ref 13.0–17.0)
MCH: 29.5 pg (ref 26.0–34.0)
MCHC: 32.9 g/dL (ref 30.0–36.0)
MCV: 89.5 fL (ref 78.0–100.0)
Platelets: 427 10*3/uL — ABNORMAL HIGH (ref 150–400)
RBC: 4.55 MIL/uL (ref 4.22–5.81)
RDW: 15.2 % (ref 11.5–15.5)
WBC: 10.8 10*3/uL — ABNORMAL HIGH (ref 4.0–10.5)

## 2017-01-15 LAB — LACTATE DEHYDROGENASE: LDH: 239 U/L — ABNORMAL HIGH (ref 98–192)

## 2017-01-15 LAB — PROTIME-INR
INR: 2.18
PROTHROMBIN TIME: 24.7 s — AB (ref 11.4–15.2)

## 2017-01-15 MED ORDER — TRAZODONE HCL 100 MG PO TABS: 100.0000 mg | ORAL_TABLET | Freq: Every day | ORAL | 6 refills | Status: DC

## 2017-01-15 MED ORDER — TRAZODONE HCL 100 MG PO TABS: 100.0000 mg | ORAL_TABLET | Freq: Every day | ORAL | 0 refills | Status: DC

## 2017-01-15 NOTE — Progress Notes (Signed)
Patient ID: Scott [name], male   DOB: 12-19-1945, 71 y.o.   MRN: [id number]   VAD CLINIC NOTE  Primary Cardiologist: Dr Haroldine Laws INR : [facility] [place]  [name] is a 71 y/o with h/o CAD with ankylosing spondylitis, DM2, carotid stenting, severe ischemic CM s/p anterior STEM with VT arrest in 12/15, large PE with pulmonary infarct in 2/15 s/p IVC filter and systolic HF with EF 55%.  He underwent HM II LVAD placement on 09/20/13. He had a prolonged hospital course due to recurrent chest bleeding, severe CO2 retention and RV failure.  Today he returns for LVAD follow up.  Overall feeling ok. SOB with inclines. Denies PND/Orthopnea. Weight at home 185-186 pounds. Denies syncope/presyncope. No bleeding problems. Active at home and continues to do yard work. Taking all medication. No hematuria/BRBPR. No fever or chills. He continues on weekly dressing changes.    6MW today 1100 feet.  VAD Indication: Destination Theradpy    VAD interrogation & Equipment Management: Speed: 9200 Flow: 4.7 Power: 5.3  PI:6.1  Alarms: no clinical alarms Events: rate PI events    Fixed speed 9200 Low speed limit: 8600  Primary Controller: Replace back up battery in 69months. Back up controller: Replace back up battery in 4months.   LVAD interrogation negative for power spikes and speed drops/PI events. No alarms recorded on event log and none reported by patient/caregiver. Back up equipment is present. No adjustments were made to prescribed settings. Pt/caregiver deny any alarms or VAD equipment issues.   LVAD equipment check completed and is in good working order. Back-up equipment present. LVAD education done on emergency procedures and precautions and reviewed exit site care.   SH:  Social History   Social History  . Marital status: Married    Spouse name: N/A  . Number of children: N/A  . Years of education: N/A   Occupational History  . Not on file.   Social History Main  Topics  . Smoking status: Former Smoker    Packs/day: 0.30    Years: 0.00    Types: Cigarettes    Quit date: 06/12/1969  . 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  . Alcohol use No  . Drug use: No  . Sexual activity: Not Currently   Other Topics Concern  . Not on file   Social History Narrative  . No narrative on file    FH:  Family History  Problem Relation Age of Onset  . Hypertension Father   . Heart attack Father   . Heart Problems Sister     Past Medical History:  Diagnosis Date  . Acute on chronic respiratory failure ([place])    a. 08/2014 in setting of PE.  Marland Kitchen Ankylosing spondylitis ([place])   . Bilateral pulmonary embolism ([place])    a. 08/2014 - started on Coumadin. Retrievable IVC filter placed 08/27/14 due to RV strain and large clot burden.  Marland Kitchen CAD (coronary artery disease)    a. stenting of LCx 2013; b. STEMI 06/12/14 s/p PCI to LAD complicated by post cath shock requiring IABP; VT s/p DCCV, EF 20%; c. NSTEMI 06/26/14 treated medically.  . Carotid artery disease ([place])    a. s/p stenting.  . Chronic systolic CHF (congestive heart failure) ([place])   . Diabetes ([place])   . Hemoptysis    a. 08/2014 possibly due to pulm infarct/PE.  Marland Kitchen Hyperlipidemia   . Hypertension   . Hypotension   .  Ischemic cardiomyopathy    a. echo 08/23/2014 EF <20%, dilated CM, mod MR/TR  . Leukocytosis   . Leukocytosis 09/10/2014  . Nephrolithiasis   . Pleural effusion on right 08/2014 - small  . Reactive thrombocytosis 09/10/2014  . Right leg DVT ([place])    a. 08/2014.  Marland Kitchen Ventricular tachycardia ([place])    a. 06/2014 at time of MI, s/p DCCV.    Current Outpatient Prescriptions  Medication Sig Dispense Refill  . acetaminophen (TYLENOL) 500 MG tablet Take 1,000 mg by mouth every 6 (six) hours as needed for moderate pain.     Marland Kitchen aspirin EC 81 MG tablet Take 1 tablet (81 mg total) by mouth daily. 90 tablet 3  . cyclobenzaprine (FLEXERIL) 5 MG tablet  Take 1 tablet (5 mg total) by mouth 3 (three) times daily as needed for muscle spasms. 270 tablet 0  . furosemide (LASIX) 20 MG tablet Every other day alternate 20 mg (1 pill) with 40 mg (2 pills) 135 tablet 3  . pantoprazole (PROTONIX) 40 MG tablet Take 1 tablet (40 mg total) by mouth at bedtime. 90 tablet 3  . potassium chloride SA (K-DUR,KLOR-CON) 20 MEQ tablet Every other day alternate 20 mEq (1 tablet) with 40 mEq (2 tablets) 135 tablet 3  . rosuvastatin (CRESTOR) 10 MG tablet Take 1 tablet (10 mg total) by mouth at bedtime. 90 tablet 3  . sildenafil (VIAGRA) 100 MG tablet Take 1 tablet (100 mg total) by mouth daily as needed for erectile dysfunction. 10 tablet 3  . traMADol (ULTRAM) 50 MG tablet Take 50 mg by mouth every 12 (twelve) hours as needed.    . traZODone (DESYREL) 150 MG tablet Take 1 tablet (150 mg total) by mouth at bedtime. (Patient taking differently: Take 100 mg by mouth at bedtime. ) 90 tablet 3  . warfarin (COUMADIN) 4 MG tablet Take 4 mg on Sundays and Thursdays, Take 6 mg all other days    . clobetasol cream (TEMOVATE) 7.10 % Apply 1 application topically 2 (two) times daily.     No current facility-administered medications for this encounter.     Vital Signs:  Doppler Pressure 78                        Automatc BP: 108/72 (89)  HR:111 SPO2:98   Weight: 192 pounds  Last weight: 193 pounds  Home weights: 185-186 pounds.     Physical Exam: GENERAL: Well appearing, male who presents to clinic today in no acute distress. Wife present  HEENT: normal  NECK: Supple, JVP 5-6 .  2+ bilaterally, no bruits.  No lymphadenopathy or thyromegaly appreciated.   CARDIAC:  Mechanical heart sounds with LVAD hum present.  LUNGS:  Clear to auscultation bilaterally.  ABDOMEN:  Soft, round, nontender, positive bowel sounds x4.     LVAD exit site: well-healed and incorporated.  Dressing dry and intact.  No erythema or drainage.  Stabilization device present and accurately  applied.  Driveline dressing is being changed daily per sterile technique. EXTREMITIES:  Warm and dry, no cyanosis, clubbing, rash or edema  NEUROLOGIC:  Alert and oriented x 4.  Gait steady.  No aphasia.  No dysarthria.  Affect pleasant.       ASSESSMENT & PLAN: 1. Chronic systolic HF: Ischemic cardiomyopathy, EF 20% s/p HMII LVAD 09/20/13.  NYHA II. Volume status stable. Continue current dose of lasix.   -2. CAD s/p Anterior STEMI on 06/12/14 with stenting of LAD:  --No  CP. Continue statin. Restarting aspirin with possible TIA.  3. VAD: Interrogated personally. Parameters stable. Continue coumadin + 81 mg aspirin daily.  4. Ankylosing spondylitis: Off NSAIDS. --Pain controlled.  5. Bilateral PE with pulmonary infarct:  --IVC filter removed.  Remains on coumadin.  6. COPD/CO2 retention, post-op:  Improved. He has not needed Bipap since being in the hospital.   7. Anticoagulation: Continue coumadin.  INR goal 2-2.5. INR 2.18.  Continue asa.   8. HTN:  --. Stable.   9. Erectile dysfunction  --Continue sildenafil as needed.   10. Transient RUE/RLE weakness-resolved.   Greater than 50% of the (total minutes 26) visit spent in counseling/coordination of care regarding HF medications and VAD parameters. Also discussed the need for aspirin.   Follow up in 2 months.     Amy Clegg,NP-C  01/15/2017

## 2017-01-15 NOTE — Progress Notes (Signed)
Patient presents for 2 month  follow up in [facility] today. Reports no problems with VAD equipment or concerns with drive line.  Vital Signs:  Doppler Pressure 78   Automatc BP: 108/72 (89) HR:111  SPO2:98  %  Weight: 192 b w/ eqt Last weight: 193.8 lb Home weights: 185 lbs   VAD Indication: Destination Therapy    VAD interrogation & Equipment Management: Speed: 9200 Flow: 4.7 Power:5.3 w    PI:6.1  Alarms: no clinical alarms Events: rare PI events  Fixed speed 9200 Low speed limit: 8600  Primary Controller: 25 mo Back up controller:   Replace back up battery in 13 months.  Annual Equipment Maintenance completed today in clinic. Next annual maintenance May 2019. Pt/caregiver refused new pt cable.   I reviewed the LVAD parameters from today and compared the results to the patient's prior recorded data. LVAD interrogation was NEGATIVE for significant power changes, NEGATIVE for clinical alarms and STABLE for PI events/speed drops. No programming changes were made and pump is functioning within specified parameters. Pt is performing daily controller and system monitor self tests along with completing weekly and monthly maintenance for LVAD equipment.  LVAD equipment check completed and is in good working order. Back-up equipment present. Charged back up battery and performed self-test on equipment.   Exit Site Care: Drive line is being maintained weekly  by his wife. Drive line exit site well healed and incorporated. The velour is fully implanted at exit site. Dressing dry and intact. No erythema or drainage. Stabilization device present and accurately applied. Pt denies fever or chills. Given 8 sorbaview dressings in clinic today. 8 anchors and 8 guaze.   Significant Events on VAD Support:  none   BP & Labs:  MAP 78 - Doppler is reflecting MAP.  Hgb 13.4 - No S/S of bleeding. Specifically denies melena/BRBPR or nosebleeds.  LDH stable at 239 with  established baseline of 250- 320. Denies tea-colored urine. No power elevations noted on interrogation.     Back up controller:  11V backup battery charged during this visit.   Plan: 1. Return to clinic in 2 months.   [name] RN [place] Coordinator   Office: [phone] 24/7 Emergency VAD Pager: [phone]

## 2017-01-24 ENCOUNTER — Ambulatory Visit (HOSPITAL_COMMUNITY): Payer: Self-pay | Admitting: Pharmacist

## 2017-01-24 DIAGNOSIS — Z5181 Encounter for therapeutic drug level monitoring: Secondary | ICD-10-CM

## 2017-01-24 DIAGNOSIS — Z95811 Presence of heart assist device: Secondary | ICD-10-CM

## 2017-01-24 LAB — POCT INR: INR: 2.9

## 2017-01-31 DIAGNOSIS — Z7901 Long term (current) use of anticoagulants: Secondary | ICD-10-CM | POA: Diagnosis not present

## 2017-01-31 LAB — POCT INR: INR: 1.7

## 2017-02-01 ENCOUNTER — Ambulatory Visit (HOSPITAL_COMMUNITY): Payer: Self-pay | Admitting: Pharmacist

## 2017-02-01 DIAGNOSIS — Z95811 Presence of heart assist device: Secondary | ICD-10-CM

## 2017-02-01 DIAGNOSIS — Z5181 Encounter for therapeutic drug level monitoring: Secondary | ICD-10-CM

## 2017-02-07 ENCOUNTER — Ambulatory Visit (HOSPITAL_COMMUNITY): Payer: Self-pay | Admitting: Pharmacist

## 2017-02-07 DIAGNOSIS — Z5181 Encounter for therapeutic drug level monitoring: Secondary | ICD-10-CM

## 2017-02-07 DIAGNOSIS — Z95811 Presence of heart assist device: Secondary | ICD-10-CM

## 2017-02-07 LAB — POCT INR: INR: 2.3

## 2017-02-14 ENCOUNTER — Ambulatory Visit (HOSPITAL_COMMUNITY): Payer: Self-pay | Admitting: Pharmacist

## 2017-02-14 DIAGNOSIS — Z5181 Encounter for therapeutic drug level monitoring: Secondary | ICD-10-CM

## 2017-02-14 DIAGNOSIS — Z95811 Presence of heart assist device: Secondary | ICD-10-CM

## 2017-02-14 LAB — POCT INR: INR: 1.8

## 2017-02-16 ENCOUNTER — Emergency Department (HOSPITAL_COMMUNITY): Payer: Medicare Other

## 2017-02-16 ENCOUNTER — Inpatient Hospital Stay (HOSPITAL_COMMUNITY)
Admission: EM | Admit: 2017-02-16 | Discharge: 2017-02-25 | DRG: 417 | Disposition: A | Discharge status: Home / Self Care | Payer: Medicare Other | Attending: Internal Medicine | Admitting: Internal Medicine

## 2017-02-16 ENCOUNTER — Inpatient Hospital Stay (HOSPITAL_COMMUNITY): Payer: Medicare Other

## 2017-02-16 ENCOUNTER — Encounter (HOSPITAL_COMMUNITY): Payer: Self-pay | Admitting: Emergency Medicine

## 2017-02-16 DIAGNOSIS — I255 Ischemic cardiomyopathy: Secondary | ICD-10-CM | POA: Diagnosis present

## 2017-02-16 DIAGNOSIS — I5022 Chronic systolic (congestive) heart failure: Secondary | ICD-10-CM | POA: Diagnosis present

## 2017-02-16 DIAGNOSIS — K812 Acute cholecystitis with chronic cholecystitis: Secondary | ICD-10-CM | POA: Diagnosis not present

## 2017-02-16 DIAGNOSIS — Z8249 Family history of ischemic heart disease and other diseases of the circulatory system: Secondary | ICD-10-CM

## 2017-02-16 DIAGNOSIS — I110 Hypertensive heart disease with heart failure: Secondary | ICD-10-CM | POA: Diagnosis present

## 2017-02-16 DIAGNOSIS — Z7982 Long term (current) use of aspirin: Secondary | ICD-10-CM

## 2017-02-16 DIAGNOSIS — Z86718 Personal history of other venous thrombosis and embolism: Secondary | ICD-10-CM

## 2017-02-16 DIAGNOSIS — I252 Old myocardial infarction: Secondary | ICD-10-CM | POA: Diagnosis not present

## 2017-02-16 DIAGNOSIS — K851 Biliary acute pancreatitis without necrosis or infection: Secondary | ICD-10-CM | POA: Diagnosis present

## 2017-02-16 DIAGNOSIS — E785 Hyperlipidemia, unspecified: Secondary | ICD-10-CM | POA: Diagnosis present

## 2017-02-16 DIAGNOSIS — Z87891 Personal history of nicotine dependence: Secondary | ICD-10-CM | POA: Diagnosis not present

## 2017-02-16 DIAGNOSIS — I10 Essential (primary) hypertension: Secondary | ICD-10-CM | POA: Diagnosis not present

## 2017-02-16 DIAGNOSIS — Z79899 Other long term (current) drug therapy: Secondary | ICD-10-CM

## 2017-02-16 DIAGNOSIS — K810 Acute cholecystitis: Secondary | ICD-10-CM | POA: Diagnosis present

## 2017-02-16 DIAGNOSIS — E119 Type 2 diabetes mellitus without complications: Secondary | ICD-10-CM | POA: Diagnosis present

## 2017-02-16 DIAGNOSIS — Z95811 Presence of heart assist device: Secondary | ICD-10-CM

## 2017-02-16 DIAGNOSIS — Z955 Presence of coronary angioplasty implant and graft: Secondary | ICD-10-CM | POA: Diagnosis not present

## 2017-02-16 DIAGNOSIS — R918 Other nonspecific abnormal finding of lung field: Secondary | ICD-10-CM | POA: Diagnosis not present

## 2017-02-16 DIAGNOSIS — I2510 Atherosclerotic heart disease of native coronary artery without angina pectoris: Secondary | ICD-10-CM | POA: Diagnosis present

## 2017-02-16 DIAGNOSIS — K8020 Calculus of gallbladder without cholecystitis without obstruction: Secondary | ICD-10-CM | POA: Diagnosis not present

## 2017-02-16 DIAGNOSIS — R1011 Right upper quadrant pain: Secondary | ICD-10-CM

## 2017-02-16 DIAGNOSIS — M459 Ankylosing spondylitis of unspecified sites in spine: Secondary | ICD-10-CM | POA: Diagnosis present

## 2017-02-16 DIAGNOSIS — R109 Unspecified abdominal pain: Secondary | ICD-10-CM | POA: Diagnosis not present

## 2017-02-16 DIAGNOSIS — K567 Ileus, unspecified: Secondary | ICD-10-CM | POA: Diagnosis not present

## 2017-02-16 DIAGNOSIS — I509 Heart failure, unspecified: Secondary | ICD-10-CM | POA: Diagnosis not present

## 2017-02-16 DIAGNOSIS — N200 Calculus of kidney: Secondary | ICD-10-CM | POA: Diagnosis not present

## 2017-02-16 DIAGNOSIS — K9189 Other postprocedural complications and disorders of digestive system: Secondary | ICD-10-CM | POA: Diagnosis not present

## 2017-02-16 LAB — URINALYSIS, ROUTINE W REFLEX MICROSCOPIC
BACTERIA UA: NONE SEEN
BILIRUBIN URINE: NEGATIVE
Glucose, UA: NEGATIVE mg/dL
HGB URINE DIPSTICK: NEGATIVE
KETONES UR: NEGATIVE mg/dL
LEUKOCYTES UA: NEGATIVE
NITRITE: NEGATIVE
PROTEIN: 30 mg/dL — AB
SQUAMOUS EPITHELIAL / LPF: NONE SEEN
Specific Gravity, Urine: 1.021 (ref 1.005–1.030)
pH: 5 (ref 5.0–8.0)

## 2017-02-16 LAB — CBC
HCT: 39.9 % (ref 39.0–52.0)
HEMOGLOBIN: 13.2 g/dL (ref 13.0–17.0)
MCH: 29.6 pg (ref 26.0–34.0)
MCHC: 33.1 g/dL (ref 30.0–36.0)
MCV: 89.5 fL (ref 78.0–100.0)
PLATELETS: 271 10*3/uL (ref 150–400)
RBC: 4.46 MIL/uL (ref 4.22–5.81)
RDW: 15.9 % — AB (ref 11.5–15.5)
WBC: 18.1 10*3/uL — ABNORMAL HIGH (ref 4.0–10.5)

## 2017-02-16 LAB — MRSA PCR SCREENING: MRSA by PCR: NEGATIVE

## 2017-02-16 LAB — COMPREHENSIVE METABOLIC PANEL
ALBUMIN: 3.8 g/dL (ref 3.5–5.0)
ALK PHOS: 332 U/L — AB (ref 38–126)
ALT: 164 U/L — ABNORMAL HIGH (ref 17–63)
AST: 163 U/L — ABNORMAL HIGH (ref 15–41)
Anion gap: 9 (ref 5–15)
BILIRUBIN TOTAL: 2.5 mg/dL — AB (ref 0.3–1.2)
BUN: 15 mg/dL (ref 6–20)
CALCIUM: 9.1 mg/dL (ref 8.9–10.3)
CO2: 24 mmol/L (ref 22–32)
CREATININE: 1.03 mg/dL (ref 0.61–1.24)
Chloride: 103 mmol/L (ref 101–111)
GFR calc Af Amer: >60 mL/min (ref >60)
GFR calc non Af Amer: >60 mL/min (ref >60)
GLUCOSE: 143 mg/dL — AB (ref 65–99)
Potassium: 4.3 mmol/L (ref 3.5–5.1)
SODIUM: 136 mmol/L (ref 135–145)
TOTAL PROTEIN: 7.9 g/dL (ref 6.5–8.1)

## 2017-02-16 LAB — PROTIME-INR
INR: 2.12
INR: 2.24
Prothrombin Time: 24 seconds — ABNORMAL HIGH (ref 11.4–15.2)
Prothrombin Time: 25.2 seconds — ABNORMAL HIGH (ref 11.4–15.2)

## 2017-02-16 LAB — LIPASE, BLOOD: Lipase: 868 U/L — ABNORMAL HIGH (ref 11–51)

## 2017-02-16 MED ORDER — ACETAMINOPHEN 325 MG PO TABS: 650.0000 mg | ORAL_TABLET | ORAL | Status: DC | PRN

## 2017-02-16 MED ORDER — PIPERACILLIN-TAZOBACTAM 3.375 G IVPB 30 MIN
3.3750 g | Freq: Once | INTRAVENOUS | Status: AC
  Administered 2017-02-16: 3.375 g via INTRAVENOUS
  Filled 2017-02-16: qty 50

## 2017-02-16 MED ORDER — TRAZODONE HCL 50 MG PO TABS
100.0000 mg | ORAL_TABLET | Freq: Every day | ORAL | Status: DC
  Administered 2017-02-17 – 2017-02-24 (×8): 100 mg via ORAL
  Filled 2017-02-16 (×8): qty 2

## 2017-02-16 MED ORDER — TRAMADOL HCL 50 MG PO TABS
50.0000 mg | ORAL_TABLET | Freq: Two times a day (BID) | ORAL | Status: DC | PRN
  Administered 2017-02-18 – 2017-02-20 (×3): 50 mg via ORAL
  Filled 2017-02-16 (×3): qty 1

## 2017-02-16 MED ORDER — CYCLOBENZAPRINE HCL 10 MG PO TABS
5.0000 mg | ORAL_TABLET | Freq: Three times a day (TID) | ORAL | Status: DC | PRN
  Administered 2017-02-18 – 2017-02-23 (×7): 5 mg via ORAL
  Filled 2017-02-16 (×9): qty 1

## 2017-02-16 MED ORDER — ASPIRIN EC 81 MG PO TBEC
81.0000 mg | DELAYED_RELEASE_TABLET | Freq: Every day | ORAL | Status: DC
  Administered 2017-02-16 – 2017-02-20 (×5): 81 mg via ORAL
  Filled 2017-02-16 (×5): qty 1

## 2017-02-16 MED ORDER — PIPERACILLIN-TAZOBACTAM 3.375 G IVPB
3.3750 g | Freq: Three times a day (TID) | INTRAVENOUS | Status: AC
  Administered 2017-02-16 – 2017-02-22 (×19): 3.375 g via INTRAVENOUS
  Filled 2017-02-16 (×22): qty 50

## 2017-02-16 MED ORDER — MORPHINE SULFATE (PF) 4 MG/ML IV SOLN
3.0000 mg | Freq: Once | INTRAVENOUS | Status: AC
  Administered 2017-02-16: 3 mg via INTRAVENOUS
  Filled 2017-02-16: qty 1

## 2017-02-16 MED ORDER — ROSUVASTATIN CALCIUM 10 MG PO TABS
10.0000 mg | ORAL_TABLET | Freq: Every day | ORAL | Status: DC
  Administered 2017-02-16 – 2017-02-24 (×8): 10 mg via ORAL
  Filled 2017-02-16 (×8): qty 1

## 2017-02-16 MED ORDER — TECHNETIUM TC 99M MEBROFENIN IV KIT
5.0000 | PACK | Freq: Once | INTRAVENOUS | Status: AC | PRN
  Administered 2017-02-16: 5 via INTRAVENOUS

## 2017-02-16 MED ORDER — ONDANSETRON HCL 4 MG/2ML IJ SOLN
4.0000 mg | Freq: Four times a day (QID) | INTRAMUSCULAR | Status: DC | PRN
Start: 2017-02-16 — End: 2017-02-25

## 2017-02-16 MED ORDER — IOPAMIDOL (ISOVUE-300) INJECTION 61%
INTRAVENOUS | Status: AC
  Administered 2017-02-16: 100 mL
  Filled 2017-02-16: qty 100

## 2017-02-16 MED ORDER — CLOBETASOL PROPIONATE 0.05 % EX CREA
1.0000 "application " | TOPICAL_CREAM | Freq: Two times a day (BID) | CUTANEOUS | Status: DC
  Filled 2017-02-16: qty 15

## 2017-02-16 MED ORDER — PANTOPRAZOLE SODIUM 40 MG IV SOLR
40.0000 mg | INTRAVENOUS | Status: DC
  Administered 2017-02-16 – 2017-02-22 (×7): 40 mg via INTRAVENOUS
  Filled 2017-02-16 (×7): qty 40

## 2017-02-16 NOTE — Consult Note (Signed)
Surgical Consultation Requesting provider: Dr. Haroldine Laws  CC: abdominal pain  HPI: Very nice man with LVAD who presents to ER with several days of post-prandial epigastric and right upper abdominal pain associated with nausea. No emesis. No fevers. No constipation or diarrhea, last bowel movement was yesterday afternoon. This has been intermittently happening for the last month but the last few days it has become much worse.   No Known Allergies  Past Medical History:  Diagnosis Date  . Acute on chronic respiratory failure ([place])    a. 08/2014 in setting of PE.  Marland Kitchen Ankylosing spondylitis ([place])   . Bilateral pulmonary embolism ([place])    a. 08/2014 - started on Coumadin. Retrievable IVC filter placed 08/27/14 due to RV strain and large clot burden.  Marland Kitchen CAD (coronary artery disease)    a. stenting of LCx 2013; b. STEMI 06/12/14 s/p PCI to LAD complicated by post cath shock requiring IABP; VT s/p DCCV, EF 20%; c. NSTEMI 06/26/14 treated medically.  . Carotid artery disease ([place])    a. s/p stenting.  . Chronic systolic CHF (congestive heart failure) ([place])   . Diabetes ([place])   . Hemoptysis    a. 08/2014 possibly due to pulm infarct/PE.  Marland Kitchen Hyperlipidemia   . Hypertension   . Hypotension   . Ischemic cardiomyopathy    a. echo 08/23/2014 EF <20%, dilated CM, mod MR/TR  . Leukocytosis   . Leukocytosis 09/10/2014  . Nephrolithiasis   . Pleural effusion on right 08/2014 - small  . Reactive thrombocytosis 09/10/2014  . Right leg DVT ([place])    a. 08/2014.  Marland Kitchen Ventricular tachycardia ([place])    a. 06/2014 at time of MI, s/p DCCV.    Past Surgical History:  Procedure Laterality Date  . BEDSIDE EVACUATION OF HEMATOMA  10/05/2014   Procedure: EVACUATION OF HEMATOMA;  Surgeon: Ivin Poot, MD;  Location: [place];  Service: Open Heart Surgery;;  . INSERTION OF IMPLANTABLE LEFT VENTRICULAR ASSIST DEVICE N/A 09/21/2014   Procedure: INSERTION OF IMPLANTABLE LEFT VENTRICULAR ASSIST DEVICE;  Surgeon: Ivin Poot, MD;  Location: [place];  Service: Open Heart Surgery;  Laterality: N/A;  CIRC ARREST  NITRIC OXIDE  . LEFT HEART CATHETERIZATION WITH CORONARY ANGIOGRAM N/A 06/12/2014   Procedure: LEFT HEART CATHETERIZATION WITH CORONARY ANGIOGRAM;  Surgeon: Lorretta Harp, MD;  Location: [facility] CATH LAB;  Service: Cardiovascular;  Laterality: N/A;  . RIGHT HEART CATHETERIZATION N/A 09/10/2014   Procedure: RIGHT HEART CATH;  Surgeon: [name], MD;  Location[facility] CATH LAB;  Service: Cardiovascular;  Laterality: N/A;  . RIGHT HEART CATHETERIZATION N/A 09/17/2014   Procedure: RIGHT HEART CATH;  Surgeon: [name], MD;  Location: [facility] CATH LAB;  Service: Cardiovascular;  Laterality: N/A;  . TEE WITHOUT CARDIOVERSION N/A 09/21/2014   Procedure: TRANSESOPHAGEAL ECHOCARDIOGRAM (TEE);  Surgeon: Ivin Poot, MD;  Location: [place];  Service: Open Heart Surgery;  Laterality: N/A;  . TEE WITHOUT CARDIOVERSION N/A 10/05/2014   Procedure: TRANSESOPHAGEAL ECHOCARDIOGRAM (TEE);  Surgeon: Ivin Poot, MD;  Location: [place];  Service: Open Heart Surgery;  Laterality: N/A;  . VIDEO ASSISTED THORACOSCOPY (VATS)/THOROCOTOMY Left 10/06/2014   Procedure: VIDEO ASSISTED THORACOSCOPY (VATS)/THOROCOTOMY;  Surgeon: Ivin Poot, MD;  Location: [place];  Service: Open Heart Surgery;  Laterality: Left;    Family History  Problem Relation Age of Onset  . Hypertension Father   . Heart attack Father   . Heart Problems Sister     Social History  Social History  . Marital status: Married    Spouse name: N/A  . Number of children: N/A  . Years of education: N/A   Social History Main Topics  . Smoking status: Former Smoker    Packs/day: 0.30    Years: 0.00    Types: Cigarettes    Quit date: 06/12/1969  . 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  . Alcohol use No  . Drug use: No  . Sexual activity: Not Currently   Other Topics Concern  . None   Social  History Narrative  . None    No current facility-administered medications on file prior to encounter.    Current Outpatient Prescriptions on File Prior to Encounter  Medication Sig Dispense Refill  . acetaminophen (TYLENOL) 500 MG tablet Take 1,000 mg by mouth every 6 (six) hours as needed for moderate pain.     Marland Kitchen aspirin EC 81 MG tablet Take 1 tablet (81 mg total) by mouth daily. 90 tablet 3  . clobetasol cream (TEMOVATE) 3.53 % Apply 1 application topically 2 (two) times daily.    . cyclobenzaprine (FLEXERIL) 5 MG tablet Take 1 tablet (5 mg total) by mouth 3 (three) times daily as needed for muscle spasms. 270 tablet 0  . furosemide (LASIX) 20 MG tablet Every other day alternate 20 mg (1 pill) with 40 mg (2 pills) 135 tablet 3  . pantoprazole (PROTONIX) 40 MG tablet Take 1 tablet (40 mg total) by mouth at bedtime. 90 tablet 3  . potassium chloride SA (K-DUR,KLOR-CON) 20 MEQ tablet Every other day alternate 20 mEq (1 tablet) with 40 mEq (2 tablets) 135 tablet 3  . rosuvastatin (CRESTOR) 10 MG tablet Take 1 tablet (10 mg total) by mouth at bedtime. 90 tablet 3  . sildenafil (VIAGRA) 100 MG tablet Take 1 tablet (100 mg total) by mouth daily as needed for erectile dysfunction. 10 tablet 3  . traMADol (ULTRAM) 50 MG tablet Take 50 mg by mouth every 12 (twelve) hours as needed.    . traZODone (DESYREL) 100 MG tablet Take 1 tablet (100 mg total) by mouth at bedtime. 30 tablet 6  . warfarin (COUMADIN) 4 MG tablet Take 6 mg (1 and 1/2 tablets) daily      Review of Systems: a complete, 10pt review of systems was completed with pertinent positives and negatives as documented in the HPI.   Physical Exam: Vitals:   02/16/17 0839  BP: 108/84  Pulse: 99  Resp: 19  Temp: 97.6 F (36.4 C)  SpO2: 97%   Gen: A&Ox3, no distress  Head: normocephalic, atraumatic, EOMI, anicteric.  Neck: supple without mass or thyromegaly Chest: unlabored respirations   Cardiovascular: LVAD  Abdomen: soft,  minimally tender, nondistended, lvad line exits right midabdomen Extremities: warm, without edema, no deformities  Neuro: grossly intact Psych: appropriate mood and affect  Skin: warm and dry  CBC Latest Ref Rng & Units 02/16/2017 01/15/2017 11/12/2016  WBC 4.0 - 10.5 K/uL 18.1(H) 10.8(H) 8.9  Hemoglobin 13.0 - 17.0 g/dL 13.2 13.4 13.5  Hematocrit 39.0 - 52.0 % 39.9 40.7 41.8  Platelets 150 - 400 K/uL 271 427(H) 234    CMP Latest Ref Rng & Units 02/16/2017 01/15/2017 11/12/2016  Glucose 65 - 99 mg/dL 143(H) 120(H) 86  BUN 6 - 20 mg/dL 15 12 13   Creatinine 0.61 - 1.24 mg/dL 1.03 1.00 0.98  Sodium 135 - 145 mmol/L 136 135 137  Potassium 3.5 - 5.1 mmol/L 4.3 4.2 4.1  Chloride 101 - 111 mmol/L 103 102 106  CO2 22 - 32 mmol/L 24 28 24   Calcium 8.9 - 10.3 mg/dL 9.1 9.2 9.3  Total Protein 6.5 - 8.1 g/dL 7.9 - -  Total Bilirubin 0.3 - 1.2 mg/dL 2.5(H) - -  Alkaline Phos 38 - 126 U/L 332(H) - -  AST 15 - 41 U/L 163(H) - -  ALT 17 - 63 U/L 164(H) - -   Lipase 868  Lab Results  Component Value Date   INR 2.12 02/16/2017   INR 1.8 02/14/2017   INR 2.3 02/07/2017    Imaging: [place]: IMPRESSION: 1. Gallbladder wall thickening. Sludge and at least 1 stone in the gallbladder. No pericholecystic fluid or Murphy's sign. If there is concern for acute cholecystitis, a HIDA scan could further evaluate. 2. Nonobstructive renal stones identified.  CT A/P IMPRESSION: 1. Stranding adjacent to the pancreatic head and uncinate process is consistent with pancreatitis, correlating with the elevated lipase. 2. The gallbladder is contracted with a thickened wall, at least 1 stone, and mild adjacent stranding. The findings are concerning for cholecystitis. A HIDA scan could further evaluate if the clinical picture is ambiguous. 3. Opacity in the lingula could represent atelectasis or infiltrate. Rounded opacity in the right base has decreased since a previous study, likely representing a small amount of  residual fluid. 4. The patient has an LVAD ascites. 5. The fluid collection anterior to the stomach is much smaller in the interval and no longer contains air. 6. Nonobstructive stones in the kidneys. 7. Colonic diverticulosis without diverticulitis. 8. Atherosclerotic change in the non aneurysmal aorta  A/P: 71yo w multiplie comorbidities including LVAD and chronic anticoagulation now with gallstone pancreatitis. -NPO, supportive management -Trend LFTs/ Lipase/CBC- if improving stone may have passed. If not may need GI consult for ERCP -Once recovered from the pancreatitis will need to address possibility of cholecystectomy   Romana Juniper, MD [facility] Surgery, [place] Pager [phone]

## 2017-02-16 NOTE — ED Notes (Addendum)
Dr Haroldine Laws at bedside, patient remains in ultrasound

## 2017-02-16 NOTE — ED Notes (Signed)
Patient taken to CT with RR RN. Zosyn paused.

## 2017-02-16 NOTE — ED Notes (Signed)
Patient back from ultrasound. General surgery at bedside. Patient aware of POC.

## 2017-02-16 NOTE — ED Notes (Signed)
Dr Alvino Chapel at bedside updating patient on POC

## 2017-02-16 NOTE — H&P (Signed)
Advanced Heart Failure VAD History and Physical Note   Reason for Admission: Ab pain/acute cholecystitis   HPI:     Scott [name] is a 71 y/o with h/o CAD with ankylosing spondylitis, DM2, carotid stenting, severe ischemic CM s/p anterior STEM with VT arrest in 12/15, large PE with pulmonary infarct in 2/15 s/p IVC filter and systolic HF with EF 44%.  He underwent HM II LVAD placement on 09/20/13. He had a prolonged hospital course due to recurrent chest bleeding, severe CO2 retention and RV failure. However has done well subsequently and has been very active without problems.  Has had intermittent biliary colic since July 4. Presented to ER today with 3 days severe post -prandial, ab pain, n/v. Found to have WBC 18k with elevated LFTs and lipase 686 c/w gallstone pancreatitis. U/s completed - results pending. CT pending. Belly currently feeling better    LVAD INTERROGATION:  HeartMate II LVAD:  Flow 4.8 liters/min, speed 9200, power 6.2, PI 5.4    Review of Systems: [y] = yes, [ ]  = no   General: Weight gain [ ] ; Weight loss [ ] ; Anorexia [ y]; Fatigue [ ] ; Fever [ ] ; Chills [ ] ; Weakness [ ]   Cardiac: Chest pain/pressure [ ] ; Resting SOB [ ] ; Exertional SOB [ ] ; Orthopnea [ ] ; Pedal Edema [ ] ; Palpitations [ ] ; Syncope [ ] ; Presyncope [ ] ; Paroxysmal nocturnal dyspnea[ ]   Pulmonary: Cough [ ] ; Wheezing[ ] ; Hemoptysis[ ] ; Sputum [ ] ; Snoring [ ]   GI: Vomiting[ ] ; Dysphagia[ ] ; Melena[ ] ; Hematochezia [ ] ; Heartburn[ ] ; Abdominal pain Blue.Reese ]; Constipation [ ] ; Diarrhea [ ] ; BRBPR [ ]   GU: Hematuria[ ] ; Dysuria [ ] ; Nocturia[ ]   Vascular: Pain in legs with walking [ ] ; Pain in feet with lying flat [ ] ; Non-healing sores [ ] ; Stroke [ ] ; TIA [ ] ; Slurred speech [ ] ;  Neuro: Headaches[ ] ; Vertigo[ ] ; Seizures[ ] ; Paresthesias[ ] ;Blurred vision [ ] ; Diplopia [ ] ; Vision changes [ ]   Ortho/Skin: Arthritis [ y]; Joint pain [ y]; Muscle pain [ ] ; Joint swelling [ ] ; Back Pain [ ] ; Rash [ ]   Psych:  Depression[ ] ; Anxiety[ ]   Heme: Bleeding problems [ ] ; Clotting disorders [ ] ; Anemia [ ]   Endocrine: Diabetes [ ] ; Thyroid dysfunction[ ]     Home Medications Prior to Admission medications   Medication Sig Start Date End Date Taking? Authorizing Provider  acetaminophen (TYLENOL) 500 MG tablet Take 1,000 mg by mouth every 6 (six) hours as needed for moderate pain.     [provider]  aspirin EC 81 MG tablet Take 1 tablet (81 mg total) by mouth daily. 11/12/16   Clegg, Amy D, NP  clobetasol cream (TEMOVATE) 3.15 % Apply 1 application topically 2 (two) times daily.    [provider]  cyclobenzaprine (FLEXERIL) 5 MG tablet Take 1 tablet (5 mg total) by mouth 3 (three) times daily as needed for muscle spasms. 09/10/16   Bensimhon, Shaune Pascal, MD  furosemide (LASIX) 20 MG tablet Every other day alternate 20 mg (1 pill) with 40 mg (2 pills) 07/26/16   Bensimhon, Shaune Pascal, MD  pantoprazole (PROTONIX) 40 MG tablet Take 1 tablet (40 mg total) by mouth at bedtime. 05/08/16   Bensimhon, Shaune Pascal, MD  potassium chloride SA (K-DUR,KLOR-CON) 20 MEQ tablet Every other day alternate 20 mEq (1 tablet) with 40 mEq (2 tablets) 07/26/16   Bensimhon, Shaune Pascal, MD  rosuvastatin (CRESTOR) 10 MG tablet Take 1 tablet (10  mg total) by mouth at bedtime. 07/26/16   Bensimhon, Shaune Pascal, MD  sildenafil (VIAGRA) 100 MG tablet Take 1 tablet (100 mg total) by mouth daily as needed for erectile dysfunction. 09/10/16   Bensimhon, Shaune Pascal, MD  traMADol (ULTRAM) 50 MG tablet Take 50 mg by mouth every 12 (twelve) hours as needed.    [provider]  traZODone (DESYREL) 100 MG tablet Take 1 tablet (100 mg total) by mouth at bedtime. 01/15/17   Clegg, Amy D, NP  warfarin (COUMADIN) 4 MG tablet Take 6 mg (1 and 1/2 tablets) daily    [provider]    Past Medical History: Past Medical History:  Diagnosis Date  . Acute on chronic respiratory failure ([place])    a. 08/2014 in setting of PE.  Marland Kitchen Ankylosing  spondylitis ([place])   . Bilateral pulmonary embolism ([place])    a. 08/2014 - started on Coumadin. Retrievable IVC filter placed 08/27/14 due to RV strain and large clot burden.  Marland Kitchen CAD (coronary artery disease)    a. stenting of LCx 2013; b. STEMI 06/12/14 s/p PCI to LAD complicated by post cath shock requiring IABP; VT s/p DCCV, EF 20%; c. NSTEMI 06/26/14 treated medically.  . Carotid artery disease ([place])    a. s/p stenting.  . Chronic systolic CHF (congestive heart failure) ([place])   . Diabetes ([place])   . Hemoptysis    a. 08/2014 possibly due to pulm infarct/PE.  Marland Kitchen Hyperlipidemia   . Hypertension   . Hypotension   . Ischemic cardiomyopathy    a. echo 08/23/2014 EF <20%, dilated CM, mod MR/TR  . Leukocytosis   . Leukocytosis 09/10/2014  . Nephrolithiasis   . Pleural effusion on right 08/2014 - small  . Reactive thrombocytosis 09/10/2014  . Right leg DVT ([place])    a. 08/2014.  Marland Kitchen Ventricular tachycardia ([place])    a. 06/2014 at time of MI, s/p DCCV.    Past Surgical History: Past Surgical History:  Procedure Laterality Date  . BEDSIDE EVACUATION OF HEMATOMA  10/05/2014   Procedure: EVACUATION OF HEMATOMA;  Surgeon: Ivin Poot, MD;  Location: [place];  Service: Open Heart Surgery;;  . INSERTION OF IMPLANTABLE LEFT VENTRICULAR ASSIST DEVICE N/A 09/21/2014   Procedure: INSERTION OF IMPLANTABLE LEFT VENTRICULAR ASSIST DEVICE;  Surgeon: Ivin Poot, MD;  Location: [place];  Service: Open Heart Surgery;  Laterality: N/A;  CIRC ARREST  NITRIC OXIDE  . LEFT HEART CATHETERIZATION WITH CORONARY ANGIOGRAM N/A 06/12/2014   Procedure: LEFT HEART CATHETERIZATION WITH CORONARY ANGIOGRAM;  Surgeon: Lorretta Harp, MD;  Location: [facility] CATH LAB;  Service: Cardiovascular;  Laterality: N/A;  . RIGHT HEART CATHETERIZATION N/A 09/10/2014   Procedure: RIGHT HEART CATH;  Surgeon: [name], MD;  Location: [facility] CATH LAB;  Service: Cardiovascular;  Laterality: N/A;  . RIGHT HEART CATHETERIZATION N/A 09/17/2014    Procedure: RIGHT HEART CATH;  Surgeon: [name], MD;  Location[facility] CATH LAB;  Service: Cardiovascular;  Laterality: N/A;  . TEE WITHOUT CARDIOVERSION N/A 09/21/2014   Procedure: TRANSESOPHAGEAL ECHOCARDIOGRAM (TEE);  Surgeon: Ivin Poot, MD;  Location: [place];  Service: Open Heart Surgery;  Laterality: N/A;  . TEE WITHOUT CARDIOVERSION N/A 10/05/2014   Procedure: TRANSESOPHAGEAL ECHOCARDIOGRAM (TEE);  Surgeon: Ivin Poot, MD;  Location: [place];  Service: Open Heart Surgery;  Laterality: N/A;  . VIDEO ASSISTED THORACOSCOPY (VATS)/THOROCOTOMY Left 10/06/2014   Procedure: VIDEO ASSISTED THORACOSCOPY (VATS)/THOROCOTOMY;  Surgeon: Ivin Poot, MD;  Location: [place];  Service: Open Heart Surgery;  Laterality: Left;    Family History: Family History  Problem Relation Age of Onset  . Hypertension Father   . Heart attack Father   . Heart Problems Sister     Social History: Social History   Social History  . Marital status: Married    Spouse name: N/A  . Number of children: N/A  . Years of education: N/A   Social History Main Topics  . Smoking status: Former Smoker    Packs/day: 0.30    Years: 0.00    Types: Cigarettes    Quit date: 06/12/1969  . 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  . Alcohol use No  . Drug use: No  . Sexual activity: Not Currently   Other Topics Concern  . None   Social History Narrative  . None    Allergies:  No Known Allergies  Objective:    Vital Signs:   Temp:  [97.6 F (36.4 C)] 97.6 F (36.4 C) (08/18 0839) Pulse Rate:  [99] 99 (08/18 0839) Resp:  [19] 19 (08/18 0839) BP: (108)/(84) 108/84 (08/18 0839) SpO2:  [97 %] 97 % (08/18 0839)   There were no vitals filed for this visit.  Mean arterial Pressure 80s  Physical Exam    General:  Sitting in wheel chair No resp difficulty HEENT: Normal Neck: supple. JVP flat . Carotids 2+ bilat; no bruits. No lymphadenopathy or  thyromegaly appreciated. Cor: Mechanical heart sounds with LVAD hum present. Lungs: Clear Abdomen: soft, mildly tender in RUQ and epigastrum. No rebound , nondistended. No hepatosplenomegaly. No bruits or masses. Good bowel sounds. Driveline: C/D/I; securement device intact and driveline incorporated Extremities: no cyanosis, clubbing, rash, edema Neuro: alert & orientedx3, cranial nerves grossly intact. moves all 4 extremities w/o difficulty. Affect pleasant   Telemetry   NSR Personally reviewed   EKG   pending  Labs    Basic Metabolic Panel:  Recent Labs Lab 02/16/17 0902  NA 136  K 4.3  CL 103  CO2 24  GLUCOSE 143*  BUN 15  CREATININE 1.03  CALCIUM 9.1    Liver Function Tests:  Recent Labs Lab 02/16/17 0902  AST 163*  ALT 164*  ALKPHOS 332*  BILITOT 2.5*  PROT 7.9  ALBUMIN 3.8   No results for input(s): LIPASE, AMYLASE in the last 168 hours. No results for input(s): AMMONIA in the last 168 hours.  CBC:  Recent Labs Lab 02/16/17 0902  WBC 18.1*  HGB 13.2  HCT 39.9  MCV 89.5  PLT 271    Cardiac Enzymes: No results for input(s): CKTOTAL, CKMB, CKMBINDEX, TROPONINI in the last 168 hours.  BNP: BNP (last 3 results) No results for input(s): BNP in the last 8760 hours.  ProBNP (last 3 results) No results for input(s): PROBNP in the last 8760 hours.   CBG: No results for input(s): GLUCAP in the last 168 hours.  Coagulation Studies:  Recent Labs  02/14/17  INR 1.8    Imaging     No results found.    Patient Profile:   71 y/o male with severe iCM and systolic HF s/p HM-II LVAD admitted 8/18 with acute gallstone pancreatitis.    Assessment/Plan:    1. Acute gallstone pancreatitis - start zosyn - u/s results pending - will need CT - consult GSU and possible GI (? Need for ERCP) - Keep npo - Hold warfarin. Start heparin when INR <  2.0  2. Chronic systolic HF - s/p VAD 0/98 - volume status stable - VAD parameters  stable - hold coumadin. Start heparin when INR < 2.0  3. CAD - stable no s/s ischemia  4. Ankylosing spondylitis - stable. Continue pain meds prn    I reviewed the LVAD parameters from today, and compared the results to the patient's prior recorded data.  No programming changes were made.  The LVAD is functioning within specified parameters.  The patient performs LVAD self-test daily.  LVAD interrogation was negative for any significant power changes, alarms or PI events/speed drops.  LVAD equipment check completed and is in good working order.  Back-up equipment present.   LVAD education done on emergency procedures and precautions and reviewed exit site care.  Length of Stay: 0  [name], MD 02/16/2017, 9:46 AM  VAD Team Pager [phone] (7am - 7am) +++VAD ISSUES ONLY+++   Advanced Heart Failure Team Pager [phone] (M-F; [place])  Please contact [facility] for night-coverage after hours (4p -7a ) and weekends on [email or web address] for all non- LVAD Issues

## 2017-02-16 NOTE — ED Notes (Signed)
Patient reports several week history of generalized abdominal pain with bloating. Reports on Wednesday and yesterday had nausea, belching helped relieve. Denies chest pain or sob. Denies fevers of diarrhea. No severe tenderness with palpation of abdomen.

## 2017-02-16 NOTE — ED Notes (Signed)
Patient to X-ray

## 2017-02-16 NOTE — ED Notes (Signed)
Spoke with RRT.  They will be here in 20 min.

## 2017-02-16 NOTE — Progress Notes (Signed)
ANTICOAGULATION CONSULT NOTE - Initial Consult  Pharmacy Consult for Heparin when INR < 2. Indication: LVAD  No Known Allergies  Patient Measurements:   Heparin Dosing Weight:   Vital Signs: Temp: 97.6 F (36.4 C) (08/18 0839) Temp Source: Oral (08/18 0839) BP: 108/84 (08/18 0839) Pulse Rate: 99 (08/18 0839)  Labs:  Recent Labs  02/14/17 02/16/17 0902 02/16/17 0921  HGB  --  13.2  --   HCT  --  39.9  --   PLT  --  271  --   LABPROT  --   --  24.0*  INR 1.8  --  2.12  CREATININE  --  1.03  --     CrCl cannot be calculated (Unknown ideal weight.).   Medical History: Past Medical History:  Diagnosis Date  . Acute on chronic respiratory failure ([place])    a. 08/2014 in setting of PE.  Marland Kitchen Ankylosing spondylitis ([place])   . Bilateral pulmonary embolism ([place])    a. 08/2014 - started on Coumadin. Retrievable IVC filter placed 08/27/14 due to RV strain and large clot burden.  Marland Kitchen CAD (coronary artery disease)    a. stenting of LCx 2013; b. STEMI 06/12/14 s/p PCI to LAD complicated by post cath shock requiring IABP; VT s/p DCCV, EF 20%; c. NSTEMI 06/26/14 treated medically.  . Carotid artery disease ([place])    a. s/p stenting.  . Chronic systolic CHF (congestive heart failure) ([place])   . Diabetes ([place])   . Hemoptysis    a. 08/2014 possibly due to pulm infarct/PE.  Marland Kitchen Hyperlipidemia   . Hypertension   . Hypotension   . Ischemic cardiomyopathy    a. echo 08/23/2014 EF <20%, dilated CM, mod MR/TR  . Leukocytosis   . Leukocytosis 09/10/2014  . Nephrolithiasis   . Pleural effusion on right 08/2014 - small  . Reactive thrombocytosis 09/10/2014  . Right leg DVT ([place])    a. 08/2014.  Marland Kitchen Ventricular tachycardia ([place])    a. 06/2014 at time of MI, s/p DCCV.    Medications:  Infusions:  . piperacillin-tazobactam      Assessment: [name] admitted with gallstone pancreatitis.  INR 2.1 today and Coumadin placed on hold while awaiting surgical input.   Pharmacy asked to begin IV heparin once INR < 2.   Goal of Therapy:  Heparin level 0.3-0.5 Monitor platelets by anticoagulation protocol: Yes   Plan:  1. Recheck INR tonight at 9 PM. 2. Start IV heparin with no bolus once INR < 2.  Will target lower end of heparin goal range.  Uvaldo Rising, BCPS  Clinical Pharmacist Pager [phone]  02/16/2017 11:18 AM

## 2017-02-16 NOTE — ED Triage Notes (Signed)
Pt is an LVAD pt that arrives with 3 days of upper abd pain with nausea. Pt denies any vomiting or diarrhea. Pt states after eating he feels bloated and has pain.

## 2017-02-16 NOTE — ED Provider Notes (Signed)
[facility] DEPT Provider Note   CSN: [id number] Arrival date & time: 02/16/17  6256     History   Chief Complaint Chief Complaint  Patient presents with  . Abdominal Pain    HPI ROBBIE RIDEAUX is a 71 y.o. male.  HPI Patient presents with upper abdominal pain and some distention. His had it worse the last 3 days because had on and off for the last month. Sometimes worsened by eating. Some nausea. Appears to worsen around 3 hours after eating. No diarrhea. No chest pain or shortness of breath. No fevers. He does have an LVAD and is on Coumadin. Over the last month the INR has become subtherapeutic on a couple of measurements.   Past Medical History:  Diagnosis Date  . Acute on chronic respiratory failure ([place])    a. 08/2014 in setting of PE.  Marland Kitchen Ankylosing spondylitis ([place])   . Bilateral pulmonary embolism ([place])    a. 08/2014 - started on Coumadin. Retrievable IVC filter placed 08/27/14 due to RV strain and large clot burden.  Marland Kitchen CAD (coronary artery disease)    a. stenting of LCx 2013; b. STEMI 06/12/14 s/p PCI to LAD complicated by post cath shock requiring IABP; VT s/p DCCV, EF 20%; c. NSTEMI 06/26/14 treated medically.  . Carotid artery disease ([place])    a. s/p stenting.  . Chronic systolic CHF (congestive heart failure) ([place])   . Diabetes ([place])   . Hemoptysis    a. 08/2014 possibly due to pulm infarct/PE.  Marland Kitchen Hyperlipidemia   . Hypertension   . Hypotension   . Ischemic cardiomyopathy    a. echo 08/23/2014 EF <20%, dilated CM, mod MR/TR  . Leukocytosis   . Leukocytosis 09/10/2014  . Nephrolithiasis   . Pleural effusion on right 08/2014 - small  . Reactive thrombocytosis 09/10/2014  . Right leg DVT ([place])    a. 08/2014.  Marland Kitchen Ventricular tachycardia ([place])    a. 06/2014 at time of MI, s/p DCCV.    Patient Active Problem List   Diagnosis Date Noted  . Acute gallstone pancreatitis 02/16/2017  . Fungal rash of trunk 03/17/2015  . Right femoral vein DVT ([place]) 01/10/2015    . Chronic anticoagulation 12/02/2014  . Anxiety state 10/11/2014  . Sleep disturbance 10/11/2014  . Left ventricular assist device (LVAD) complication [id number]  . LVAD (left ventricular assist device) present ([place])   . PAF (paroxysmal atrial fibrillation) ([place])   . CHF (congestive heart failure) ([place]) 09/21/2014  . Palliative care encounter 09/16/2014  . Weakness generalized 09/16/2014  . Encounter for central line placement   . Asystole ([place]) 09/10/2014  . Acute on chronic systolic CHF (congestive heart failure) ([place]) 09/10/2014  . Acute pulmonary embolism ([place]) 09/10/2014  . Acute on chronic systolic heart failure, NYHA class 3 ([place]) 09/10/2014  . Reactive thrombocytosis 09/10/2014  . Leukocytosis 09/10/2014  . Encounter for therapeutic drug monitoring 09/08/2014  . Cough with hemoptysis   . Acute respiratory failure with hypoxia ([place]) 08/27/2014  . Pleural effusion 08/27/2014  . Hypoxia   . PE (pulmonary embolism) 08/26/2014  . Pulmonary embolus ([place]) 08/26/2014  . CAD in native artery 06/22/2014  . Chronic systolic heart failure ([place]) 06/22/2014  . Abdominal pain, acute   . STEMI (ST elevation myocardial infarction) ([place]) 06/12/2014  . Cardiogenic shock ([place]) 06/12/2014  . VT (ventricular tachycardia) ([place])   . Acute pulmonary edema ([facility])   . Acute respiratory acidosis   . Encounter for imaging study to confirm orogastric (OG)  tube placement   . Respiratory failure ([place])   . ST elevation myocardial infarction involving left anterior descending (LAD) coronary artery [facility])     Past Surgical History:  Procedure Laterality Date  . BEDSIDE EVACUATION OF HEMATOMA  10/05/2014   Procedure: EVACUATION OF HEMATOMA;  Surgeon: Ivin Poot, MD;  Location: [facility];  Service: Open Heart Surgery;;  . INSERTION OF IMPLANTABLE LEFT VENTRICULAR ASSIST DEVICE N/A 09/21/2014   Procedure: INSERTION OF IMPLANTABLE LEFT VENTRICULAR ASSIST DEVICE;  Surgeon: Ivin Poot, MD;  Location: [facility];  Service: Open Heart Surgery;  Laterality: N/A;  CIRC ARREST  NITRIC OXIDE  . LEFT HEART CATHETERIZATION WITH CORONARY ANGIOGRAM N/A 06/12/2014   Procedure: LEFT HEART CATHETERIZATION WITH CORONARY ANGIOGRAM;  Surgeon: Lorretta Harp, MD;  Location[facility] CATH LAB;  Service: Cardiovascular;  Laterality: N/A;  . RIGHT HEART CATHETERIZATION N/A 09/10/2014   Procedure: RIGHT HEART CATH;  Surgeon: [name], MD;  Location: [facility] CATH LAB;  Service: Cardiovascular;  Laterality: N/A;  . RIGHT HEART CATHETERIZATION N/A 09/17/2014   Procedure: RIGHT HEART CATH;  Surgeon: [name], MD;  Location: [facility] CATH LAB;  Service: Cardiovascular;  Laterality: N/A;  . TEE WITHOUT CARDIOVERSION N/A 09/21/2014   Procedure: TRANSESOPHAGEAL ECHOCARDIOGRAM (TEE);  Surgeon: Ivin Poot, MD;  Location: [facility];  Service: Open Heart Surgery;  Laterality: N/A;  . TEE WITHOUT CARDIOVERSION N/A 10/05/2014   Procedure: TRANSESOPHAGEAL ECHOCARDIOGRAM (TEE);  Surgeon: Ivin Poot, MD;  Location: [facility];  Service: Open Heart Surgery;  Laterality: N/A;  . VIDEO ASSISTED THORACOSCOPY (VATS)/THOROCOTOMY Left 10/06/2014   Procedure: VIDEO ASSISTED THORACOSCOPY (VATS)/THOROCOTOMY;  Surgeon: Ivin Poot, MD;  Location: [facility];  Service: Open Heart Surgery;  Laterality: Left;       Home Medications    Prior to Admission medications   Medication Sig Start Date End Date Taking? Authorizing Provider  acetaminophen (TYLENOL) 500 MG tablet Take 1,000 mg by mouth every 6 (six) hours as needed for moderate pain.    Yes [provider]  aspirin EC 81 MG tablet Take 1 tablet (81 mg total) by mouth daily. 11/12/16  Yes Clegg, Amy D, NP  furosemide (LASIX) 20 MG tablet Every other day alternate 20 mg (1 pill) with 40 mg (2 pills) 07/26/16  Yes Bensimhon, Shaune Pascal, MD  pantoprazole (PROTONIX) 40 MG tablet Take 1 tablet (40 mg total) by mouth at bedtime. 05/08/16  Yes Bensimhon, Shaune Pascal, MD  potassium chloride SA  (K-DUR,KLOR-CON) 20 MEQ tablet Every other day alternate 20 mEq (1 tablet) with 40 mEq (2 tablets) 07/26/16  Yes Bensimhon, Shaune Pascal, MD  rosuvastatin (CRESTOR) 10 MG tablet Take 1 tablet (10 mg total) by mouth at bedtime. 07/26/16  Yes Bensimhon, Shaune Pascal, MD  traZODone (DESYREL) 100 MG tablet Take 1 tablet (100 mg total) by mouth at bedtime. 01/15/17  Yes Clegg, Amy D, NP  warfarin (COUMADIN) 4 MG tablet Take 6 mg by mouth daily. Take 6 mg (1 and 1/2 tablets) daily   Yes [provider]  clobetasol cream (TEMOVATE) 3.23 % Apply 1 application topically 2 (two) times daily.    [provider]  cyclobenzaprine (FLEXERIL) 5 MG tablet Take 1 tablet (5 mg total) by mouth 3 (three) times daily as needed for muscle spasms. 09/10/16   Bensimhon, Shaune Pascal, MD  sildenafil (VIAGRA) 100 MG tablet Take 1 tablet (100 mg total) by mouth daily as needed for erectile dysfunction. Patient taking differently: Take 50  mg by mouth daily as needed for erectile dysfunction.  09/10/16   Bensimhon, Shaune Pascal, MD  traMADol (ULTRAM) 50 MG tablet Take 50 mg by mouth every 12 (twelve) hours as needed for moderate pain.     [provider]    Family History Family History  Problem Relation Age of Onset  . Hypertension Father   . Heart attack Father   . Heart Problems Sister     Social History Social History  Substance Use Topics  . Smoking status: Former Smoker    Packs/day: 0.30    Years: 0.00    Types: Cigarettes    Quit date: 06/12/1969  . 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  . Alcohol use No     Allergies   Patient has no known allergies.   Review of Systems Review of Systems  Constitutional: Positive for appetite change. Negative for fever.  HENT: Negative for congestion.   Respiratory: Negative for shortness of breath.   Cardiovascular: Negative for chest pain.  Gastrointestinal: Positive for abdominal pain and nausea.    Genitourinary: Negative for flank pain.  Musculoskeletal: Negative for back pain.  Skin: Negative for wound.  Neurological: Negative for seizures.     Physical Exam Updated Vital Signs BP 108/84 (BP Location: Left Arm)   Pulse 99   Temp 97.6 F (36.4 C) (Oral)   Resp 19   SpO2 97%   Physical Exam  Constitutional: He appears well-developed.  HENT:  Head: Normocephalic.  Neck: Neck supple.  Cardiovascular: Normal rate.   Hum from LVAD auscultated  Abdominal: There is tenderness.  Mild distention. Epigastric to right upper quadrant tenderness. No rebound or guarding. Dressing over LVAD wire on right abdomen intact without erythema.  Musculoskeletal: He exhibits no tenderness.  Neurological: He is alert.  Skin: Skin is warm.     ED Treatments / Results  Labs (all labs ordered are listed, but only abnormal results are displayed) Labs Reviewed  LIPASE, BLOOD - Abnormal; Notable for the following:       Result Value   Lipase 868 (*)    All other components within normal limits  COMPREHENSIVE METABOLIC PANEL - Abnormal; Notable for the following:    Glucose, Bld 143 (*)    AST 163 (*)    ALT 164 (*)    Alkaline Phosphatase 332 (*)    Total Bilirubin 2.5 (*)    All other components within normal limits  CBC - Abnormal; Notable for the following:    WBC 18.1 (*)    RDW 15.9 (*)    All other components within normal limits  URINALYSIS, ROUTINE W REFLEX MICROSCOPIC - Abnormal; Notable for the following:    Color, Urine AMBER (*)    Protein, ur 30 (*)    All other components within normal limits  PROTIME-INR - Abnormal; Notable for the following:    Prothrombin Time 24.0 (*)    All other components within normal limits  PROTIME-INR    EKG  EKG Interpretation None       Radiology Dg Chest 2 View  Result Date: 02/16/2017 CLINICAL DATA:  Right upper quadrant pain EXAM: CHEST  2 VIEW COMPARISON:  05/08/2016 FINDINGS: There is no focal parenchymal opacity. There  is no pleural effusion or pneumothorax. There is stable cardiomegaly. There is evidence of a left ventricular assist device. There is prior median sternotomy. The osseous structures are unremarkable. IMPRESSION: No  active cardiopulmonary disease. Electronically Signed   By: Kathreen Devoid   On: 02/16/2017 10:22   US Abdomen Complete  Result Date: 02/16/2017 CLINICAL DATA:  Right upper quadrant pain for 3 weeks. EXAM: ABDOMEN ULTRASOUND COMPLETE COMPARISON:  CT scan October 21, 2014 FINDINGS: Gallbladder: There is gallbladder wall thickening measuring 5 mm. At least 1 discrete 7 mm stone is seen in the gallbladder. I suspect sludge or perhaps other small stones. No pericholecystic fluid or Murphy's sign. Common bile duct: Diameter: 2 mm Liver: The left hepatic lobe was not well seen due to shadowing bowel gas. No focal lesions. Portal vein is patent on color Doppler imaging with normal direction of blood flow towards the liver. IVC: No abnormality visualized. Pancreas: Not well visualized. Spleen: Size and appearance within normal limits. Right Kidney: Length: 13.5 cm. At least 1 discrete 8 mm stone is seen with no hydronephrosis. Left Kidney: Length: 14.3 cm. At least 1 discrete 8 mm stone is identified with no hydronephrosis. Abdominal aorta: No aneurysm visualized. Other findings: None. IMPRESSION: 1. Gallbladder wall thickening. Sludge and at least 1 stone in the gallbladder. No pericholecystic fluid or Murphy's sign. If there is concern for acute cholecystitis, a HIDA scan could further evaluate. 2. Nonobstructive renal stones identified. Electronically Signed   By: Dorise Bullion III M.D   On: 02/16/2017 11:01   Ct Abdomen Pelvis W Contrast  Result Date: 02/16/2017 CLINICAL DATA:  Abdominal pain. EXAM: CT ABDOMEN AND PELVIS WITH CONTRAST TECHNIQUE: Multidetector CT imaging of the abdomen and pelvis was performed using the standard protocol following bolus administration of intravenous contrast. CONTRAST:   12mL ISOVUE-300 IOPAMIDOL (ISOVUE-300) INJECTION 61% COMPARISON:  CT scan October 21, 2014 FINDINGS: Lower chest: There is opacity in the lingula which could represent atelectasis or infiltrate. Rounded opacity in the right lung on series 5, image 9 is at the site of previous fluid, possibly representing a small amount of residual fluid in this region. Recommend attention on follow-up. Cardiomegaly. The patient has an LVAD device. Coronary artery calcifications are identified. Hepatobiliary: The liver is unremarkable. The gallbladder appears thick walled with cholelithiasis. There is some mild stranding adjacent to the gallbladder. The portal vein is patent. Pancreas: There is stranding adjacent to the pancreatic head and uncinate process. Given the elevated LFTs, this is likely due to pancreatitis rather than adjacent duodenitis. Spleen: The irregular spleen contour is stable. No acute splenic abnormalities. Adrenals/Urinary Tract: The adrenal glands are normal. There is a nonobstructive stone in the right kidney and at least 2 on the left. No suspicious renal masses. No ureterectasis or ureteral stones. The bladder is unremarkable. Stomach/Bowel: The stomach is normal. There is stranding near the pancreatic head and third portion the duodenum, thought to arise from the pancreas. The small bowel is otherwise normal. Colonic diverticulosis is seen without diverticulitis. Visualized appendix is normal. Vascular/Lymphatic: Atherosclerotic changes are seen in the non aneurysmal aorta. No adenopathy. Reproductive: Prostate is unremarkable. Other: The fluid collection seen anterior to the stomach on the previous study no longer contains air and is much smaller in the interval measuring 6.4 by a 2.0 cm. No free air. No other fluid collection or free fluid. Musculoskeletal: No acute bony changes. IMPRESSION: 1. Stranding adjacent to the pancreatic head and uncinate process is consistent with pancreatitis, correlating with  the elevated lipase. 2. The gallbladder is contracted with a thickened wall, at least 1 stone, and mild adjacent stranding. The findings are concerning for cholecystitis. A HIDA scan could further  evaluate if the clinical picture is ambiguous. 3. Opacity in the lingula could represent atelectasis or infiltrate. Rounded opacity in the right base has decreased since a previous study, likely representing a small amount of residual fluid. 4. The patient has an LVAD ascites. 5. The fluid collection anterior to the stomach is much smaller in the interval and no longer contains air. 6. Nonobstructive stones in the kidneys. 7. Colonic diverticulosis without diverticulitis. 8. Atherosclerotic change in the non aneurysmal aorta. Aortic Atherosclerosis (ICD10-I70.0). Electronically Signed   By: Dorise Bullion III M.D   On: 02/16/2017 12:02   Dg Abd 2 Views  Result Date: 02/16/2017 CLINICAL DATA:  Right upper quadrant pain EXAM: ABDOMEN - 2 VIEW COMPARISON:  None. FINDINGS: Small bowel dilatation measuring up to 4 cm in diameter with an air-fluid level concerning for a small bowel obstruction. There is no evidence of pneumoperitoneum, portal venous gas or pneumatosis. There are no pathologic calcifications along the expected course of the ureters. The osseous structures are unremarkable. IMPRESSION: Small bowel dilatation measuring up to 4 cm in diameter with an air-fluid level concerning for a small bowel obstruction. Electronically Signed   By: Kathreen Devoid   On: 02/16/2017 10:25    Procedures Procedures (including critical care time)  Medications Ordered in ED Medications  piperacillin-tazobactam (ZOSYN) IVPB 3.375 g (3.375 g Intravenous New Bag/Given 02/16/17 1117)  iopamidol (ISOVUE-300) 61 % injection (100 mLs  Contrast Given 02/16/17 1138)     Initial Impression / Assessment and Plan / ED Course  I have reviewed the triage vital signs and the nursing notes.  Pertinent labs & imaging results that were  available during my care of the patient were reviewed by me and considered in my medical decision making (see chart for details).     Patient with abdominal pain. On chronic LVAD. Discussed with cardiology, who will admit the patient. Ultrasound showed gallstones with possible wall thickening without pericholecystic fluid or tenderness. However LFTs and lipase are elevated. CT scan showed likely a pancreatitis. Will admit. Will be seen by general surgery  Final Clinical Impressions(s) / ED Diagnoses   Final diagnoses:  RUQ pain  Acute gallstone pancreatitis    New Prescriptions New Prescriptions   No medications on file     Davonna Belling, MD 02/16/17 1306

## 2017-02-17 LAB — PROTIME-INR
INR: 2.28
INR: 2.24
PROTHROMBIN TIME: 25.5 s — AB (ref 11.4–15.2)
PROTHROMBIN TIME: 25.2 s — AB (ref 11.4–15.2)

## 2017-02-17 LAB — COMPREHENSIVE METABOLIC PANEL
ALT: 154 U/L — AB (ref 17–63)
AST: 130 U/L — AB (ref 15–41)
Albumin: 3.2 g/dL — ABNORMAL LOW (ref 3.5–5.0)
Alkaline Phosphatase: 270 U/L — ABNORMAL HIGH (ref 38–126)
Anion gap: 10 (ref 5–15)
BUN: 16 mg/dL (ref 6–20)
CALCIUM: 8.9 mg/dL (ref 8.9–10.3)
CHLORIDE: 105 mmol/L (ref 101–111)
CO2: 23 mmol/L (ref 22–32)
CREATININE: 0.92 mg/dL (ref 0.61–1.24)
GFR calc non Af Amer: >60 mL/min (ref >60)
GLUCOSE: 80 mg/dL (ref 65–99)
Potassium: 3.9 mmol/L (ref 3.5–5.1)
SODIUM: 138 mmol/L (ref 135–145)
Total Bilirubin: 1.7 mg/dL — ABNORMAL HIGH (ref 0.3–1.2)
Total Protein: 7.2 g/dL (ref 6.5–8.1)

## 2017-02-17 LAB — CBC
HCT: 37.2 % — ABNORMAL LOW (ref 39.0–52.0)
HEMOGLOBIN: 12.2 g/dL — AB (ref 13.0–17.0)
MCH: 29.5 pg (ref 26.0–34.0)
MCHC: 32.8 g/dL (ref 30.0–36.0)
MCV: 89.9 fL (ref 78.0–100.0)
PLATELETS: 273 10*3/uL (ref 150–400)
RBC: 4.14 MIL/uL — AB (ref 4.22–5.81)
RDW: 16.2 % — ABNORMAL HIGH (ref 11.5–15.5)
WBC: 18.4 10*3/uL — ABNORMAL HIGH (ref 4.0–10.5)

## 2017-02-17 LAB — LACTATE DEHYDROGENASE: LDH: 266 U/L — ABNORMAL HIGH (ref 98–192)

## 2017-02-17 LAB — LIPASE, BLOOD: Lipase: 109 U/L — ABNORMAL HIGH (ref 11–51)

## 2017-02-17 MED ORDER — CLOBETASOL PROPIONATE 0.05 % EX CREA
1.0000 "application " | TOPICAL_CREAM | CUTANEOUS | Status: DC | PRN
  Filled 2017-02-17: qty 15

## 2017-02-17 MED ORDER — DEXTROSE-NACL 5-0.45 % IV SOLN
INTRAVENOUS | Status: DC
  Administered 2017-02-17 – 2017-02-21 (×3): via INTRAVENOUS

## 2017-02-17 NOTE — Progress Notes (Signed)
[facility] Progress Note     Subjective: CC:  Upper abdominal pain significantly improved. Patient is frustrated because he wants to drink/eat. Denies nausea or vomiting but states prior to admission he "came close". Last BM was Friday and was normal.   Objective: Vital signs in last 24 hours: Temp:  [97.4 F (36.3 C)-99.9 F (37.7 C)] 99.1 F (37.3 C) (08/19 0734) Pulse Rate:  [42-99] 69 (08/19 0600) Resp:  [19-29] 20 (08/19 0600) BP: (68-114)/(56-96) 68/56 (08/19 0400) SpO2:  [95 %-99 %] 96 % (08/19 0600) Weight:  [84.1 kg (185 lb 6.5 oz)-84.4 kg (186 lb)] 84.1 kg (185 lb 6.5 oz) (08/19 0600) Last BM Date: 02/15/17  Intake/Output from previous day: 08/18 0701 - 08/19 0700 In: 50 [IV Piggyback:50] Out: 475 [Urine:475] Intake/Output this shift: No intake/output data recorded.  PE: Gen:  Alert, NAD, cooperative HEENT: EOMs in tact, PERRL Card:  Regular rhythm that sounds mechanical, pedal pulses 2+ BL Pulm:  Normal effort, CTAB Abd: Soft, non-tender, non-distended, bowel sounds present but hard to appreciate 2/2 LVAD hum. Skin: warm and dry, no rashes  Psych: A&Ox3   Lab Results:   Recent Labs  02/16/17 0902  WBC 18.1*  HGB 13.2  HCT 39.9  PLT 271   BMET  Recent Labs  02/16/17 0902  NA 136  K 4.3  CL 103  CO2 24  GLUCOSE 143*  BUN 15  CREATININE 1.03  CALCIUM 9.1   PT/INR  Recent Labs  02/16/17 0921 02/16/17 2157  LABPROT 24.0* 25.2*  INR 2.12 2.24   CMP     Component Value Date/Time   NA 136 02/16/2017 0902   NA 138 06/27/2014 0602   K 4.3 02/16/2017 0902   K 4.0 06/27/2014 0602   CL 103 02/16/2017 0902   CL 105 06/27/2014 0602   CO2 24 02/16/2017 0902   CO2 27 06/27/2014 0602   GLUCOSE 143 (H) 02/16/2017 0902   GLUCOSE 96 06/27/2014 0602   BUN 15 02/16/2017 0902   BUN 19 (H) 06/27/2014 0602   CREATININE 1.03 02/16/2017 0902   CREATININE 1.20 06/27/2014 0602   CALCIUM 9.1 02/16/2017 0902   CALCIUM 8.4 (L) 06/27/2014  0602   PROT 7.9 02/16/2017 0902   PROT 7.6 06/26/2014 1425   ALBUMIN 3.8 02/16/2017 0902   ALBUMIN 3.3 (L) 06/26/2014 1425   AST 163 (H) 02/16/2017 0902   AST 36 06/26/2014 1425   ALT 164 (H) 02/16/2017 0902   ALT 72 (H) 06/26/2014 1425   ALKPHOS 332 (H) 02/16/2017 0902   ALKPHOS 173 (H) 06/26/2014 1425   BILITOT 2.5 (H) 02/16/2017 0902   BILITOT 0.4 06/26/2014 1425   GFRNONAA >60 02/16/2017 0902   GFRNONAA >60 06/27/2014 0602   GFRNONAA >60 03/24/2013 0436   GFRAA >60 02/16/2017 0902   GFRAA >60 06/27/2014 0602   GFRAA >60 03/24/2013 0436   Lipase     Component Value Date/Time   LIPASE 868 (H) 02/16/2017 0902   LIPASE 133 07/17/2011 1501       Studies/Results: Dg Chest 2 View  Result Date: 02/16/2017 CLINICAL DATA:  Right upper quadrant pain EXAM: CHEST  2 VIEW COMPARISON:  05/08/2016 FINDINGS: There is no focal parenchymal opacity. There is no pleural effusion or pneumothorax. There is stable cardiomegaly. There is evidence of a left ventricular assist device. There is prior median sternotomy. The osseous structures are unremarkable. IMPRESSION: No active cardiopulmonary disease. Electronically Signed   By: Kathreen Devoid   On: 02/16/2017  10:22   US Abdomen Complete  Result Date: 02/16/2017 CLINICAL DATA:  Right upper quadrant pain for 3 weeks. EXAM: ABDOMEN ULTRASOUND COMPLETE COMPARISON:  CT scan October 21, 2014 FINDINGS: Gallbladder: There is gallbladder wall thickening measuring 5 mm. At least 1 discrete 7 mm stone is seen in the gallbladder. I suspect sludge or perhaps other small stones. No pericholecystic fluid or Murphy's sign. Common bile duct: Diameter: 2 mm Liver: The left hepatic lobe was not well seen due to shadowing bowel gas. No focal lesions. Portal vein is patent on color Doppler imaging with normal direction of blood flow towards the liver. IVC: No abnormality visualized. Pancreas: Not well visualized. Spleen: Size and appearance within normal limits. Right  Kidney: Length: 13.5 cm. At least 1 discrete 8 mm stone is seen with no hydronephrosis. Left Kidney: Length: 14.3 cm. At least 1 discrete 8 mm stone is identified with no hydronephrosis. Abdominal aorta: No aneurysm visualized. Other findings: None. IMPRESSION: 1. Gallbladder wall thickening. Sludge and at least 1 stone in the gallbladder. No pericholecystic fluid or Murphy's sign. If there is concern for acute cholecystitis, a HIDA scan could further evaluate. 2. Nonobstructive renal stones identified. Electronically Signed   By: Dorise Bullion III M.D   On: 02/16/2017 11:01   Ct Abdomen Pelvis W Contrast  Result Date: 02/16/2017 CLINICAL DATA:  Abdominal pain. EXAM: CT ABDOMEN AND PELVIS WITH CONTRAST TECHNIQUE: Multidetector CT imaging of the abdomen and pelvis was performed using the standard protocol following bolus administration of intravenous contrast. CONTRAST:  169mL ISOVUE-300 IOPAMIDOL (ISOVUE-300) INJECTION 61% COMPARISON:  CT scan October 21, 2014 FINDINGS: Lower chest: There is opacity in the lingula which could represent atelectasis or infiltrate. Rounded opacity in the right lung on series 5, image 9 is at the site of previous fluid, possibly representing a small amount of residual fluid in this region. Recommend attention on follow-up. Cardiomegaly. The patient has an LVAD device. Coronary artery calcifications are identified. Hepatobiliary: The liver is unremarkable. The gallbladder appears thick walled with cholelithiasis. There is some mild stranding adjacent to the gallbladder. The portal vein is patent. Pancreas: There is stranding adjacent to the pancreatic head and uncinate process. Given the elevated LFTs, this is likely due to pancreatitis rather than adjacent duodenitis. Spleen: The irregular spleen contour is stable. No acute splenic abnormalities. Adrenals/Urinary Tract: The adrenal glands are normal. There is a nonobstructive stone in the right kidney and at least 2 on the left. No  suspicious renal masses. No ureterectasis or ureteral stones. The bladder is unremarkable. Stomach/Bowel: The stomach is normal. There is stranding near the pancreatic head and third portion the duodenum, thought to arise from the pancreas. The small bowel is otherwise normal. Colonic diverticulosis is seen without diverticulitis. Visualized appendix is normal. Vascular/Lymphatic: Atherosclerotic changes are seen in the non aneurysmal aorta. No adenopathy. Reproductive: Prostate is unremarkable. Other: The fluid collection seen anterior to the stomach on the previous study no longer contains air and is much smaller in the interval measuring 6.4 by a 2.0 cm. No free air. No other fluid collection or free fluid. Musculoskeletal: No acute bony changes. IMPRESSION: 1. Stranding adjacent to the pancreatic head and uncinate process is consistent with pancreatitis, correlating with the elevated lipase. 2. The gallbladder is contracted with a thickened wall, at least 1 stone, and mild adjacent stranding. The findings are concerning for cholecystitis. A HIDA scan could further evaluate if the clinical picture is ambiguous. 3. Opacity in the lingula could represent atelectasis  or infiltrate. Rounded opacity in the right base has decreased since a previous study, likely representing a small amount of residual fluid. 4. The patient has an LVAD ascites. 5. The fluid collection anterior to the stomach is much smaller in the interval and no longer contains air. 6. Nonobstructive stones in the kidneys. 7. Colonic diverticulosis without diverticulitis. 8. Atherosclerotic change in the non aneurysmal aorta. Aortic Atherosclerosis (ICD10-I70.0). Electronically Signed   By: Dorise Bullion III M.D   On: 02/16/2017 12:02   Nm Hepato W/eject Fract  Result Date: 02/16/2017 CLINICAL DATA:  Abdominal pain after eating. EXAM: NUCLEAR MEDICINE HEPATOBILIARY IMAGING TECHNIQUE: Sequential images of the abdomen were obtained out to 60  minutes following intravenous administration of radiopharmaceutical. RADIOPHARMACEUTICALS:  5.2 mCi Tc-27m  Choletec IV COMPARISON:  Ultrasound and CT imaging from earlier today. FINDINGS: There is prompt uptake in the liver with normal excretion the bowel. The gallbladder never filled throughout the study, despite the administration of 3 mg of morphine. No other abnormalities. IMPRESSION: The gallbladder did not fill. Given the findings on recent CT and ultrasound imaging, the findings are consistent with cystic duct obstruction and cholecystitis. These results will be called to the ordering clinician or representative by the Radiologist Assistant, and communication documented in the PACS or zVision Dashboard. Electronically Signed   By: Dorise Bullion III M.D   On: 02/16/2017 18:30   Dg Abd 2 Views  Result Date: 02/16/2017 CLINICAL DATA:  Right upper quadrant pain EXAM: ABDOMEN - 2 VIEW COMPARISON:  None. FINDINGS: Small bowel dilatation measuring up to 4 cm in diameter with an air-fluid level concerning for a small bowel obstruction. There is no evidence of pneumoperitoneum, portal venous gas or pneumatosis. There are no pathologic calcifications along the expected course of the ureters. The osseous structures are unremarkable. IMPRESSION: Small bowel dilatation measuring up to 4 cm in diameter with an air-fluid level concerning for a small bowel obstruction. Electronically Signed   By: Kathreen Devoid   On: 02/16/2017 10:25    Anti-infectives: Anti-infectives    Start     Dose/Rate Route Frequency Ordered Stop   02/16/17 2200  piperacillin-tazobactam (ZOSYN) IVPB 3.375 g     3.375 g 12.5 mL/hr over 240 Minutes Intravenous Every 8 hours 02/16/17 2147     02/16/17 0945  piperacillin-tazobactam (ZOSYN) IVPB 3.375 g     3.375 g 100 mL/hr over 30 Minutes Intravenous  Once 02/16/17 0943 02/16/17 1147       Assessment/Plan Chronic systolic HF s/p LVAD [id number]/9381 Dr. [name] - warfarin held. Echo  07/10/2016 EF 10% CAD Ankylosing spondylitis  HTN HLD DM  Acute gallstone pancreatitis  - Lipase 868, AST 163, ALT 164, AP 332, total bilirubin 2.5; repeat labs this AM pending - NPO, IVF - PRN pain meds and anti-emetics   - if bilirubin remains elevated or increased, GI consult for MRCP/ERCP  Cholecystitis  - HIDA 8/18 no visualization of gallbladder - WBC 18,400 - IV abx   FEN: NPO, IVF  ID: Zosyn 8/18 >> VTE: SCD's, heparin gtt   Plan: IV abx, supportive care, bowel rest. Further surgical planning for possible cholecystectomy this admission once acute pancreatitis resolves.   LOS: 1 day    [facility] 02/17/2017, 7:50 AM Pager: [phone] Consults: [phone] Mon-Fri 7:00 am-4:30 pm Sat-Sun 7:00 am-11:30 am

## 2017-02-17 NOTE — Progress Notes (Signed)
Advanced Heart Failure VAD Team Note  Subjective:    Admitted with acute gallstone pancreatitis.  Feeling better. Wants to eat.  LFTs/lipase coming down.   U/S, CT and HIDA scan all reviewed personally. No evidence of CBD so likely passed stone though there is evidence of ongoing cystic duct obstruction. GSU has seen. Recommending continued bowel rest with possible cholecystectomy vs C-tube this week   LVAD INTERROGATION:  HeartMate II LVAD:  Flow  4.8 liters/min, speed 9200, power 5.0, PI 5.7.    Objective:    Vital Signs:   Temp:  [97.4 F (36.3 C)-99.9 F (37.7 C)] 99.1 F (37.3 C) (08/19 0734) Pulse Rate:  [42-99] 88 (08/19 0805) Resp:  [17-29] 17 (08/19 0805) BP: (68-114)/(56-96) 77/56 (08/19 0805) SpO2:  [95 %-99 %] 98 % (08/19 0805) Weight:  [84.1 kg (185 lb 6.5 oz)-84.4 kg (186 lb)] 84.1 kg (185 lb 6.5 oz) (08/19 0600) Last BM Date: 02/15/17 Mean arterial Pressure 70-80s  Intake/Output:   Intake/Output Summary (Last 24 hours) at 02/17/17 0934 Last data filed at 02/17/17 0200  Gross per 24 hour  Intake               50 ml  Output              475 ml  Net             -425 ml     Physical Exam    General:  Well appearing. No resp difficulty HEENT: normal. Anicteric  Neck: supple. JVP . Carotids 2+ bilat; no bruits. No lymphadenopathy or thyromegaly appreciated. Cor: Mechanical heart sounds with LVAD hum present. Lungs: clear Abdomen: soft, nontender, nondistended. No hepatosplenomegaly. No bruits or masses. Good bowel sounds. Driveline: C/D/I; securement device intact and driveline incorporated Extremities: no cyanosis, clubbing, rash, edema Neuro: alert & orientedx3, cranial nerves grossly intact. moves all 4 extremities w/o difficulty. Affect pleasant   Telemetry   NSR Personally reviewed    Labs   Basic Metabolic Panel:  Recent Labs Lab 02/16/17 0902 02/17/17 0730  NA 136 138  K 4.3 3.9  CL 103 105  CO2 24 23  GLUCOSE 143* 80  BUN 15 16    CREATININE 1.03 0.92  CALCIUM 9.1 8.9    Liver Function Tests:  Recent Labs Lab 02/16/17 0902 02/17/17 0730  AST 163* 130*  ALT 164* 154*  ALKPHOS 332* 270*  BILITOT 2.5* 1.7*  PROT 7.9 7.2  ALBUMIN 3.8 3.2*    Recent Labs Lab 02/16/17 0902 02/17/17 0730  LIPASE 868* 109*   No results for input(s): AMMONIA in the last 168 hours.  CBC:  Recent Labs Lab 02/16/17 0902 02/17/17 0730  WBC 18.1* 18.4*  HGB 13.2 12.2*  HCT 39.9 37.2*  MCV 89.5 89.9  PLT 271 273    INR:  Recent Labs Lab 02/14/17 02/16/17 0921 02/16/17 2157 02/17/17 0730  INR 1.8 2.12 2.24 2.28    Other results:     Imaging   Dg Chest 2 View  Result Date: 02/16/2017 CLINICAL DATA:  Right upper quadrant pain EXAM: CHEST  2 VIEW COMPARISON:  05/08/2016 FINDINGS: There is no focal parenchymal opacity. There is no pleural effusion or pneumothorax. There is stable cardiomegaly. There is evidence of a left ventricular assist device. There is prior median sternotomy. The osseous structures are unremarkable. IMPRESSION: No active cardiopulmonary disease. Electronically Signed   By: Kathreen Devoid   On: 02/16/2017 10:22   US Abdomen Complete  Result Date: 02/16/2017  CLINICAL DATA:  Right upper quadrant pain for 3 weeks. EXAM: ABDOMEN ULTRASOUND COMPLETE COMPARISON:  CT scan October 21, 2014 FINDINGS: Gallbladder: There is gallbladder wall thickening measuring 5 mm. At least 1 discrete 7 mm stone is seen in the gallbladder. I suspect sludge or perhaps other small stones. No pericholecystic fluid or Murphy's sign. Common bile duct: Diameter: 2 mm Liver: The left hepatic lobe was not well seen due to shadowing bowel gas. No focal lesions. Portal vein is patent on color Doppler imaging with normal direction of blood flow towards the liver. IVC: No abnormality visualized. Pancreas: Not well visualized. Spleen: Size and appearance within normal limits. Right Kidney: Length: 13.5 cm. At least 1 discrete 8 mm stone  is seen with no hydronephrosis. Left Kidney: Length: 14.3 cm. At least 1 discrete 8 mm stone is identified with no hydronephrosis. Abdominal aorta: No aneurysm visualized. Other findings: None. IMPRESSION: 1. Gallbladder wall thickening. Sludge and at least 1 stone in the gallbladder. No pericholecystic fluid or Murphy's sign. If there is concern for acute cholecystitis, a HIDA scan could further evaluate. 2. Nonobstructive renal stones identified. Electronically Signed   By: Dorise Bullion III M.D   On: 02/16/2017 11:01   Ct Abdomen Pelvis W Contrast  Result Date: 02/16/2017 CLINICAL DATA:  Abdominal pain. EXAM: CT ABDOMEN AND PELVIS WITH CONTRAST TECHNIQUE: Multidetector CT imaging of the abdomen and pelvis was performed using the standard protocol following bolus administration of intravenous contrast. CONTRAST:  133mL ISOVUE-300 IOPAMIDOL (ISOVUE-300) INJECTION 61% COMPARISON:  CT scan October 21, 2014 FINDINGS: Lower chest: There is opacity in the lingula which could represent atelectasis or infiltrate. Rounded opacity in the right lung on series 5, image 9 is at the site of previous fluid, possibly representing a small amount of residual fluid in this region. Recommend attention on follow-up. Cardiomegaly. The patient has an LVAD device. Coronary artery calcifications are identified. Hepatobiliary: The liver is unremarkable. The gallbladder appears thick walled with cholelithiasis. There is some mild stranding adjacent to the gallbladder. The portal vein is patent. Pancreas: There is stranding adjacent to the pancreatic head and uncinate process. Given the elevated LFTs, this is likely due to pancreatitis rather than adjacent duodenitis. Spleen: The irregular spleen contour is stable. No acute splenic abnormalities. Adrenals/Urinary Tract: The adrenal glands are normal. There is a nonobstructive stone in the right kidney and at least 2 on the left. No suspicious renal masses. No ureterectasis or ureteral  stones. The bladder is unremarkable. Stomach/Bowel: The stomach is normal. There is stranding near the pancreatic head and third portion the duodenum, thought to arise from the pancreas. The small bowel is otherwise normal. Colonic diverticulosis is seen without diverticulitis. Visualized appendix is normal. Vascular/Lymphatic: Atherosclerotic changes are seen in the non aneurysmal aorta. No adenopathy. Reproductive: Prostate is unremarkable. Other: The fluid collection seen anterior to the stomach on the previous study no longer contains air and is much smaller in the interval measuring 6.4 by a 2.0 cm. No free air. No other fluid collection or free fluid. Musculoskeletal: No acute bony changes. IMPRESSION: 1. Stranding adjacent to the pancreatic head and uncinate process is consistent with pancreatitis, correlating with the elevated lipase. 2. The gallbladder is contracted with a thickened wall, at least 1 stone, and mild adjacent stranding. The findings are concerning for cholecystitis. A HIDA scan could further evaluate if the clinical picture is ambiguous. 3. Opacity in the lingula could represent atelectasis or infiltrate. Rounded opacity in the right base has decreased  since a previous study, likely representing a small amount of residual fluid. 4. The patient has an LVAD ascites. 5. The fluid collection anterior to the stomach is much smaller in the interval and no longer contains air. 6. Nonobstructive stones in the kidneys. 7. Colonic diverticulosis without diverticulitis. 8. Atherosclerotic change in the non aneurysmal aorta. Aortic Atherosclerosis (ICD10-I70.0). Electronically Signed   By: Dorise Bullion III M.D   On: 02/16/2017 12:02   Nm Hepato W/eject Fract  Result Date: 02/16/2017 CLINICAL DATA:  Abdominal pain after eating. EXAM: NUCLEAR MEDICINE HEPATOBILIARY IMAGING TECHNIQUE: Sequential images of the abdomen were obtained out to 60 minutes following intravenous administration of  radiopharmaceutical. RADIOPHARMACEUTICALS:  5.2 mCi Tc-39m  Choletec IV COMPARISON:  Ultrasound and CT imaging from earlier today. FINDINGS: There is prompt uptake in the liver with normal excretion the bowel. The gallbladder never filled throughout the study, despite the administration of 3 mg of morphine. No other abnormalities. IMPRESSION: The gallbladder did not fill. Given the findings on recent CT and ultrasound imaging, the findings are consistent with cystic duct obstruction and cholecystitis. These results will be called to the ordering clinician or representative by the Radiologist Assistant, and communication documented in the PACS or zVision Dashboard. Electronically Signed   By: Dorise Bullion III M.D   On: 02/16/2017 18:30   Dg Abd 2 Views  Result Date: 02/16/2017 CLINICAL DATA:  Right upper quadrant pain EXAM: ABDOMEN - 2 VIEW COMPARISON:  None. FINDINGS: Small bowel dilatation measuring up to 4 cm in diameter with an air-fluid level concerning for a small bowel obstruction. There is no evidence of pneumoperitoneum, portal venous gas or pneumatosis. There are no pathologic calcifications along the expected course of the ureters. The osseous structures are unremarkable. IMPRESSION: Small bowel dilatation measuring up to 4 cm in diameter with an air-fluid level concerning for a small bowel obstruction. Electronically Signed   By: Kathreen Devoid   On: 02/16/2017 10:25      Medications:     Scheduled Medications: . aspirin EC  81 mg Oral Daily  . clobetasol cream  1 application Topical BID  . pantoprazole (PROTONIX) IV  40 mg Intravenous Q24H  . rosuvastatin  10 mg Oral QHS  . traZODone  100 mg Oral QHS     Infusions: . piperacillin-tazobactam (ZOSYN)  IV 3.375 g (02/17/17 0625)     PRN Medications:  acetaminophen, cyclobenzaprine, ondansetron (ZOFRAN) IV, traMADol   Patient Profile   71 y/o male with severe iCM and systolic HF s/p HM-II LVAD (3/16) admitted 8/18 with acute  gallstone pancreatitis.     Assessment/Plan:    1. Acute gallstone pancreatitis - symptomatically much improved. Nontender on exam.  LFTs and lipase trending down.  - u/s, CT and HIDA scan all reviewed personally. No evidence of CBD so likely passed stone though there is evidence of ongoing cystic duct obstruction - GSU has seen. Recommending continued bowel rest with possible cholecystectomy vs C-tube this week. Would favor cholecystectomy. D/w Dr. [name] who will d/w surgical team as well  - Hold warfarin. Start heparin when INR < 2.0. D/w PharmdD - Will duve him some D5 1/2NS while NPO - Continue zosyn  2. Chronic systolic HF - s/p VAD 5/39 - volume status stable - VAD parameters stable - hold coumadin. Start heparin when INR < 2.0  3. CAD - stable no s/s ischemia  4. Ankylosing spondylitis - stable. Continue pain meds prn     I reviewed the LVAD  parameters from today, and compared the results to the patient's prior recorded data.  No programming changes were made.  The LVAD is functioning within specified parameters.  The patient performs LVAD self-test daily.  LVAD interrogation was negative for any significant power changes, alarms or PI events/speed drops.  LVAD equipment check completed and is in good working order.  Back-up equipment present.   LVAD education done on emergency procedures and precautions and reviewed exit site care.  Length of Stay: 1  [name], MD 02/17/2017, 9:34 AM  VAD Team --- VAD ISSUES ONLY--- Pager [phone] (7am - 7am)  Advanced Heart Failure Team  Pager [phone] (M-F; 7a - 4p)  Please contact [facility] for night-coverage after hours (4p -7a ) and weekends on [email or web address]

## 2017-02-17 NOTE — Plan of Care (Signed)
Problem: Pain Managment: Goal: General experience of comfort will improve Outcome: Progressing Pt without further c/o RUQ abdominal pain. Remains NPO per orders.  Problem: Activity: Goal: Risk for activity intolerance will decrease Outcome: Completed/Met Date Met: 02/17/17 Pt able to move around room independently with guidance of equipment.   

## 2017-02-17 NOTE — Progress Notes (Signed)
[place] for Heparin when INR < 2. Indication: LVAD  No Known Allergies  Patient Measurements: Height: 5\' 10"  (177.8 cm) Weight: 185 lb 6.5 oz (84.1 kg) IBW/kg (Calculated) : 73 Heparin Dosing Weight:   Vital Signs: Temp: 99.1 F (37.3 C) (08/19 0734) Temp Source: Oral (08/19 0734) BP: 77/56 (08/19 0805) Pulse Rate: 88 (08/19 0805)  Labs:  Recent Labs  02/16/17 0902 02/16/17 0921 02/16/17 2157 02/17/17 0730  HGB 13.2  --   --  12.2*  HCT 39.9  --   --  37.2*  PLT 271  --   --  273  LABPROT  --  24.0* 25.2* 25.5*  INR  --  2.12 2.24 2.28  CREATININE 1.03  --   --  0.92    Estimated Creatinine Clearance: 78.2 mL/min (by C-G formula based on SCr of 0.92 mg/dL).   Medical History: Past Medical History:  Diagnosis Date  . Acute on chronic respiratory failure ([place])    a. 08/2014 in setting of PE.  Marland Kitchen Ankylosing spondylitis ([place])   . Bilateral pulmonary embolism ([place])    a. 08/2014 - started on Coumadin. Retrievable IVC filter placed 08/27/14 due to RV strain and large clot burden.  Marland Kitchen CAD (coronary artery disease)    a. stenting of LCx 2013; b. STEMI 06/12/14 s/p PCI to LAD complicated by post cath shock requiring IABP; VT s/p DCCV, EF 20%; c. NSTEMI 06/26/14 treated medically.  . Carotid artery disease ([place])    a. s/p stenting.  . Chronic systolic CHF (congestive heart failure) ([place])   . Diabetes ([place])   . Hemoptysis    a. 08/2014 possibly due to pulm infarct/PE.  Marland Kitchen Hyperlipidemia   . Hypertension   . Hypotension   . Ischemic cardiomyopathy    a. echo 08/23/2014 EF <20%, dilated CM, mod MR/TR  . Leukocytosis   . Leukocytosis 09/10/2014  . Nephrolithiasis   . Pleural effusion on right 08/2014 - small  . Reactive thrombocytosis 09/10/2014  . Right leg DVT ([place])    a. 08/2014.  Marland Kitchen Ventricular tachycardia ([place])    a. 06/2014 at time of MI, s/p DCCV.    Medications:  Infusions:  . dextrose 5 % and 0.45% NaCl    .  piperacillin-tazobactam (ZOSYN)  IV 3.375 g (02/17/17 2263)    Assessment: [name] admitted with gallstone pancreatitis.  INR 2.28 today and Coumadin placed on hold while awaiting surgical input.  Pharmacy asked to begin IV heparin once INR < 2.  No Coumadin given since admission.    Goal of Therapy:  Heparin level 0.3-0.5 Monitor platelets by anticoagulation protocol: Yes   Plan:  1. Recheck INR tonight at 6 PM. 2. Start IV heparin with no bolus once INR < 2.  Will target lower end of heparin goal range.  Uvaldo Rising, BCPS  Clinical Pharmacist Pager [phone]  02/17/2017 10:26 AM

## 2017-02-18 LAB — CBC
HEMATOCRIT: 38.6 % — AB (ref 39.0–52.0)
HEMOGLOBIN: 12.1 g/dL — AB (ref 13.0–17.0)
MCH: 28.2 pg (ref 26.0–34.0)
MCHC: 31.3 g/dL (ref 30.0–36.0)
MCV: 90 fL (ref 78.0–100.0)
Platelets: 327 10*3/uL (ref 150–400)
RBC: 4.29 MIL/uL (ref 4.22–5.81)
RDW: 16 % — AB (ref 11.5–15.5)
WBC: 17.6 10*3/uL — AB (ref 4.0–10.5)

## 2017-02-18 LAB — PROTIME-INR
INR: 2.16
Prothrombin Time: 24.4 seconds — ABNORMAL HIGH (ref 11.4–15.2)

## 2017-02-18 LAB — LACTATE DEHYDROGENASE: LDH: 264 U/L — ABNORMAL HIGH (ref 98–192)

## 2017-02-18 LAB — COMPREHENSIVE METABOLIC PANEL
ALBUMIN: 3.1 g/dL — AB (ref 3.5–5.0)
ALT: 144 U/L — ABNORMAL HIGH (ref 17–63)
AST: 112 U/L — AB (ref 15–41)
Alkaline Phosphatase: 240 U/L — ABNORMAL HIGH (ref 38–126)
Anion gap: 6 (ref 5–15)
BILIRUBIN TOTAL: 1.5 mg/dL — AB (ref 0.3–1.2)
BUN: 17 mg/dL (ref 6–20)
CHLORIDE: 104 mmol/L (ref 101–111)
CO2: 28 mmol/L (ref 22–32)
Calcium: 8.9 mg/dL (ref 8.9–10.3)
Creatinine, Ser: 1.08 mg/dL (ref 0.61–1.24)
GFR calc Af Amer: >60 mL/min (ref >60)
GFR calc non Af Amer: >60 mL/min (ref >60)
Glucose, Bld: 75 mg/dL (ref 65–99)
POTASSIUM: 4.3 mmol/L (ref 3.5–5.1)
Sodium: 138 mmol/L (ref 135–145)
TOTAL PROTEIN: 7 g/dL (ref 6.5–8.1)

## 2017-02-18 LAB — LIPASE, BLOOD: LIPASE: 99 U/L — AB (ref 11–51)

## 2017-02-18 NOTE — Care Management Note (Signed)
Case Management Note Marvetta Gibbons RN, BSN Unit 4E-Case Manager-- [place] coverage [phone]  Patient Details  Name: CLARICE BONAVENTURE MRN: [id number] Date of Birth: [birth year]  Subjective/Objective:   LVAD pt  Admitted with acute gallstone pancreatitis  --  Bowel rest- surgery following ?? Lap chole later this week             Action/Plan: PTA pt lived at home with spouse- CM to follow for d/c needs pending progress.   Expected Discharge Date:                  Expected Discharge Plan:  [place]  In-House Referral:     Discharge planning Services  CM Consult  Post Acute Care Choice:    Choice offered to:     DME Arranged:    DME Agency:     HH Arranged:    [facility] Agency:     Status of Service:  In process, will continue to follow  If discussed at Long Length of Stay Meetings, dates discussed:    Discharge Disposition:   Additional Comments:  Dawayne Patricia, RN 02/18/2017, 10:43 AM

## 2017-02-18 NOTE — Progress Notes (Signed)
LVAD Coordinator Rounding Note:  Admitted 8/19 due to nausea and vomiting. Pt has acute gallstone pancreatitis.  Heartmate II LVAD implanted on 09/21/14 by PVT under Destination therapy criteria.  Vital signs: HR: 88 Doppler Pressure:85 Automatic BP: 114/84 (93) O2 Sat: 95 Wt: 184lbs     LVAD interrogation reveals:   Speed: 9200 Flow: 4.7 Power: 5.2 PI: 6.2 Alarms: none Events:  Rare PI Hematocrit: 38.6 Fixed speed: 9200 Low speed limit: 8600   Drive Line: To be changed by pts wife.  Labs:  LDH trend: 264  INR trend: 2.16  Anticoagulation Plan: -INR Goal: 2-2.5 -ASA Dose: 81 mg  Blood Products:  none  Device: none  Renal:  -BUN/CRT: 17/1.08   Adverse Events on VAD: -8/19-admitted for  Plan/Recommendations:   1. Awaiting INR to drift to 1.5 for cholecystectomy, possibly Wednesday. 2. Page VAD Coordinator for any equipment issues.  [name] RN, BSN  VAD Coordinator 24/7 pager [phone]

## 2017-02-18 NOTE — Progress Notes (Signed)
Advanced Heart Failure VAD Team Note  Subjective:    Admitted with acute gallstone pancreatitis.    Affect flat. Denies abdominal pain this am. Just "tired and weak". Denies lightheadedness or dizziness. Denies SOB.   U/S, CT and HIDA scan all reviewed personally. No evidence of CBD so likely passed stone though there is evidence of ongoing cystic duct obstruction. [facility]SU has seen. Recommending continued bowel rest with possible cholecystectomy vs C-tube this week   LVAD INTERROGATION:  HeartMate II LVAD:  Flow  5.0 liters/min, speed 9200, power 5.0, PI 5.2. Occasional PI events.   Objective:    Vital Signs:   Temp:  [97.8 F (36.6 C)-100 F (37.8 C)] 100 F (37.8 C) (08/20 0419) Pulse Rate:  [38-100] 91 (08/20 0419) Resp:  [17-28] 25 (08/20 0419) BP: (70-100)/(55-80) 70/55 (08/20 0419) SpO2:  [92 %-98 %] 96 % (08/20 0419) Weight:  [184 lb 8.4 oz (83.7 kg)] 184 lb 8.4 oz (83.7 kg) (08/20 0630) Last BM Date: 02/15/17 Mean arterial Pressure 62 this am.   Intake/Output:   Intake/Output Summary (Last 24 hours) at 02/18/17 0733 Last data filed at 02/18/17 0600  Gross per 24 hour  Intake           799.17 ml  Output             1100 ml  Net          -300.83 ml     Physical Exam    GENERAL: Elderly and fatigued appearing this am. NAD.  HEENT: Normal. NECK: Supple, JVP 6-7 cm. Carotids OK.  CARDIAC:  Mechanical heart sounds with LVAD hum present.  LUNGS:  CTAB, normal effort.  ABDOMEN:  NT, ND, no HSM. No bruits or masses. +BS  LVAD exit site: Well-healed and incorporated. Dressing dry and intact. No erythema or drainage. Stabilization device present and accurately applied. Driveline dressing changed daily per sterile technique. EXTREMITIES:  Warm and dry. No cyanosis, clubbing, rash, or edema.  NEUROLOGIC:  Alert & oriented x 3. Cranial nerves grossly intact. Moves all 4 extremities w/o difficulty. Affect flat.    Telemetry   Personally reviewed, NSR  Labs   Basic  Metabolic Panel:  Recent Labs Lab 02/16/17 0902 02/17/17 0730 02/18/17 0212  NA 136 138 138  K 4.3 3.9 4.3  CL 103 105 104  CO2 24 23 28   GLUCOSE 143* 80 75  BUN 15 16 17   CREATININE 1.03 0.92 1.08  CALCIUM 9.1 8.9 8.9    Liver Function Tests:  Recent Labs Lab 02/16/17 0902 02/17/17 0730 02/18/17 0212  AST 163* 130* 112*  ALT 164* 154* 144*  ALKPHOS 332* 270* 240*  BILITOT 2.5* 1.7* 1.5*  PROT 7.9 7.2 7.0  ALBUMIN 3.8 3.2* 3.1*    Recent Labs Lab 02/16/17 0902 02/17/17 0730 02/18/17 0212  LIPASE 868* 109* 99*   No results for input(s): AMMONIA in the last 168 hours.  CBC:  Recent Labs Lab 02/16/17 0902 02/17/17 0730 02/18/17 0212  WBC 18.1* 18.4* 17.6*  HGB 13.2 12.2* 12.1*  HCT 39.9 37.2* 38.6*  MCV 89.5 89.9 90.0  PLT 271 273 327    INR:  Recent Labs Lab 02/16/17 0921 02/16/17 2157 02/17/17 0730 02/17/17 1812 02/18/17 0212  INR 2.12 2.24 2.28 2.24 2.16    Other results:     Imaging   Dg Chest 2 View  Result Date: 02/16/2017 CLINICAL DATA:  Right upper quadrant pain EXAM: CHEST  2 VIEW COMPARISON:  05/08/2016 FINDINGS: There is  no focal parenchymal opacity. There is no pleural effusion or pneumothorax. There is stable cardiomegaly. There is evidence of a left ventricular assist device. There is prior median sternotomy. The osseous structures are unremarkable. IMPRESSION: No active cardiopulmonary disease. Electronically Signed   By: Kathreen Devoid   On: 02/16/2017 10:22   US Abdomen Complete  Result Date: 02/16/2017 CLINICAL DATA:  Right upper quadrant pain for 3 weeks. EXAM: ABDOMEN ULTRASOUND COMPLETE COMPARISON:  CT scan October 21, 2014 FINDINGS: Gallbladder: There is gallbladder wall thickening measuring 5 mm. At least 1 discrete 7 mm stone is seen in the gallbladder. I suspect sludge or perhaps other small stones. No pericholecystic fluid or Murphy's sign. Common bile duct: Diameter: 2 mm Liver: The left hepatic lobe was not well seen  due to shadowing bowel gas. No focal lesions. Portal vein is patent on color Doppler imaging with normal direction of blood flow towards the liver. IVC: No abnormality visualized. Pancreas: Not well visualized. Spleen: Size and appearance within normal limits. Right Kidney: Length: 13.5 cm. At least 1 discrete 8 mm stone is seen with no hydronephrosis. Left Kidney: Length: 14.3 cm. At least 1 discrete 8 mm stone is identified with no hydronephrosis. Abdominal aorta: No aneurysm visualized. Other findings: None. IMPRESSION: 1. Gallbladder wall thickening. Sludge and at least 1 stone in the gallbladder. No pericholecystic fluid or Murphy's sign. If there is concern for acute cholecystitis, a HIDA scan could further evaluate. 2. Nonobstructive renal stones identified. Electronically Signed   By: Dorise Bullion III M.D   On: 02/16/2017 11:01   Ct Abdomen Pelvis W Contrast  Result Date: 02/16/2017 CLINICAL DATA:  Abdominal pain. EXAM: CT ABDOMEN AND PELVIS WITH CONTRAST TECHNIQUE: Multidetector CT imaging of the abdomen and pelvis was performed using the standard protocol following bolus administration of intravenous contrast. CONTRAST:  167mL ISOVUE-300 IOPAMIDOL (ISOVUE-300) INJECTION 61% COMPARISON:  CT scan October 21, 2014 FINDINGS: Lower chest: There is opacity in the lingula which could represent atelectasis or infiltrate. Rounded opacity in the right lung on series 5, image 9 is at the site of previous fluid, possibly representing a small amount of residual fluid in this region. Recommend attention on follow-up. Cardiomegaly. The patient has an LVAD device. Coronary artery calcifications are identified. Hepatobiliary: The liver is unremarkable. The gallbladder appears thick walled with cholelithiasis. There is some mild stranding adjacent to the gallbladder. The portal vein is patent. Pancreas: There is stranding adjacent to the pancreatic head and uncinate process. Given the elevated LFTs, this is likely due  to pancreatitis rather than adjacent duodenitis. Spleen: The irregular spleen contour is stable. No acute splenic abnormalities. Adrenals/Urinary Tract: The adrenal glands are normal. There is a nonobstructive stone in the right kidney and at least 2 on the left. No suspicious renal masses. No ureterectasis or ureteral stones. The bladder is unremarkable. Stomach/Bowel: The stomach is normal. There is stranding near the pancreatic head and third portion the duodenum, thought to arise from the pancreas. The small bowel is otherwise normal. Colonic diverticulosis is seen without diverticulitis. Visualized appendix is normal. Vascular/Lymphatic: Atherosclerotic changes are seen in the non aneurysmal aorta. No adenopathy. Reproductive: Prostate is unremarkable. Other: The fluid collection seen anterior to the stomach on the previous study no longer contains air and is much smaller in the interval measuring 6.4 by a 2.0 cm. No free air. No other fluid collection or free fluid. Musculoskeletal: No acute bony changes. IMPRESSION: 1. Stranding adjacent to the pancreatic head and uncinate process is  consistent with pancreatitis, correlating with the elevated lipase. 2. The gallbladder is contracted with a thickened wall, at least 1 stone, and mild adjacent stranding. The findings are concerning for cholecystitis. A HIDA scan could further evaluate if the clinical picture is ambiguous. 3. Opacity in the lingula could represent atelectasis or infiltrate. Rounded opacity in the right base has decreased since a previous study, likely representing a small amount of residual fluid. 4. The patient has an LVAD ascites. 5. The fluid collection anterior to the stomach is much smaller in the interval and no longer contains air. 6. Nonobstructive stones in the kidneys. 7. Colonic diverticulosis without diverticulitis. 8. Atherosclerotic change in the non aneurysmal aorta. Aortic Atherosclerosis (ICD10-I70.0). Electronically Signed   By:  Dorise Bullion III M.D   On: 02/16/2017 12:02   Nm Hepato W/eject Fract  Result Date: 02/16/2017 CLINICAL DATA:  Abdominal pain after eating. EXAM: NUCLEAR MEDICINE HEPATOBILIARY IMAGING TECHNIQUE: Sequential images of the abdomen were obtained out to 60 minutes following intravenous administration of radiopharmaceutical. RADIOPHARMACEUTICALS:  5.2 mCi Tc-64m  Choletec IV COMPARISON:  Ultrasound and CT imaging from earlier today. FINDINGS: There is prompt uptake in the liver with normal excretion the bowel. The gallbladder never filled throughout the study, despite the administration of 3 mg of morphine. No other abnormalities. IMPRESSION: The gallbladder did not fill. Given the findings on recent CT and ultrasound imaging, the findings are consistent with cystic duct obstruction and cholecystitis. These results will be called to the ordering clinician or representative by the Radiologist Assistant, and communication documented in the PACS or zVision Dashboard. Electronically Signed   By: Dorise Bullion III M.D   On: 02/16/2017 18:30   Dg Abd 2 Views  Result Date: 02/16/2017 CLINICAL DATA:  Right upper quadrant pain EXAM: ABDOMEN - 2 VIEW COMPARISON:  None. FINDINGS: Small bowel dilatation measuring up to 4 cm in diameter with an air-fluid level concerning for a small bowel obstruction. There is no evidence of pneumoperitoneum, portal venous gas or pneumatosis. There are no pathologic calcifications along the expected course of the ureters. The osseous structures are unremarkable. IMPRESSION: Small bowel dilatation measuring up to 4 cm in diameter with an air-fluid level concerning for a small bowel obstruction. Electronically Signed   By: Kathreen Devoid   On: 02/16/2017 10:25     Medications:     Scheduled Medications: . aspirin EC  81 mg Oral Daily  . pantoprazole (PROTONIX) IV  40 mg Intravenous Q24H  . rosuvastatin  10 mg Oral QHS  . traZODone  100 mg Oral QHS    Infusions: . dextrose 5 %  and 0.45% NaCl 50 mL/hr at 02/18/17 0128  . piperacillin-tazobactam (ZOSYN)  IV 3.375 g (02/18/17 0623)    PRN Medications: acetaminophen, clobetasol cream, cyclobenzaprine, ondansetron (ZOFRAN) IV, traMADol   Patient Profile   71 y/o male with severe iCM and systolic HF s/p HM-II LVAD (3/16) admitted 8/18 with acute gallstone pancreatitis.    Assessment/Plan:    1. Acute gallstone pancreatitis - Symptomatically improved. No tenderness on exam. LFTs and lipase continue to trend down.  - u/s, CT and HIDA scan all previously reviewed personally. No evidence of CBD so likely passed stone though there is evidence of ongoing cystic duct obstruction - [facility] has seen. Recommending continued bowel rest with possible cholecystectomy vs C-tube this week. Would favor cholecystectomy. D/w Dr. [name] who will d/w surgical team as well  - Awaiting word from surgery for plan.  - Coumadin on  hold. Plan heparin when INR <2.0.  - Can give  D5 1/2NS while NPO - Continue zosyn  2. Chronic systolic HF - s/p VAD 5/44 - volume status stable - VAD parameters stable - Coumadin on hold. Plan heparin when INR < 2.0.   3. CAD - Stable. No s/s Ischemia.   4. Ankylosing spondylitis - Stable. Continue pain meds prn.   Awaiting surgery for disposition.   I reviewed the LVAD parameters from today, and compared the results to the patient's prior recorded data.  No programming changes were made.  The LVAD is functioning within specified parameters.  The patient performs LVAD self-test daily.  LVAD interrogation was negative for any significant power changes, alarms or PI events/speed drops.  LVAD equipment check completed and is in good working order.  Back-up equipment present.   LVAD education done on emergency procedures and precautions and reviewed exit site care.   Length of Stay: 2  Annamaria Helling 02/18/2017, 7:33 AM  VAD Team --- VAD ISSUES ONLY--- Pager [phone] (7am -  7am)  Advanced Heart Failure Team  Pager [phone] (M-F; 7a - 4p)  Please contact [facility] for night-coverage after hours (4p -7a ) and weekends on [email or web address]  Patient seen and examined with the above-signed Advanced Practice Provider and/or Housestaff. I personally reviewed laboratory data, imaging studies and relevant notes. I independently examined the patient and formulated the important aspects of the plan. I have edited the note to reflect any of my changes or salient points. I have personally discussed the plan with the patient and/or family.  Symptomatically much improved. LFTs and lipase coming down. HF stable. INR 2.1.  Discussed with [facility] team. Plan for lap chole when INR < 1.5. Likely on Wednesday.  VAD parameters stable.   [name], MD  9:19 AM

## 2017-02-18 NOTE — Progress Notes (Signed)
Subjective/Chief Complaint: Having flatus, npo, no abd pain now   Objective: Vital signs in last 24 hours: Temp:  [97.8 F (36.6 C)-100 F (37.8 C)] 98.5 F (36.9 C) (08/20 0812) Pulse Rate:  [38-100] 91 (08/20 0419) Resp:  [20-28] 25 (08/20 0419) BP: (70-100)/(55-80) 70/55 (08/20 0419) SpO2:  [92 %-98 %] 96 % (08/20 0419) Weight:  [83.7 kg (184 lb 8.4 oz)] 83.7 kg (184 lb 8.4 oz) (08/20 0630) Last BM Date: 02/15/17  Intake/Output from previous day: 08/19 0701 - 08/20 0700 In: 799.2 [P.O.:80; I.V.:569.2; IV Piggyback:150] Out: 1100 [Urine:1100] Intake/Output this shift: No intake/output data recorded.  Resp: clear to auscultation bilaterally GI: vad in place, soft nt/nd bs present  Lab Results:   Recent Labs  02/17/17 0730 02/18/17 0212  WBC 18.4* 17.6*  HGB 12.2* 12.1*  HCT 37.2* 38.6*  PLT 273 327   BMET  Recent Labs  02/17/17 0730 02/18/17 0212  NA 138 138  K 3.9 4.3  CL 105 104  CO2 23 28  GLUCOSE 80 75  BUN 16 17  CREATININE 0.92 1.08  CALCIUM 8.9 8.9   PT/INR  Recent Labs  02/17/17 1812 02/18/17 0212  LABPROT 25.2* 24.4*  INR 2.24 2.16   ABG No results for input(s): PHART, HCO3 in the last 72 hours.  Invalid input(s): PCO2, PO2  Studies/Results: Dg Chest 2 View  Result Date: 02/16/2017 CLINICAL DATA:  Right upper quadrant pain EXAM: CHEST  2 VIEW COMPARISON:  05/08/2016 FINDINGS: There is no focal parenchymal opacity. There is no pleural effusion or pneumothorax. There is stable cardiomegaly. There is evidence of a left ventricular assist device. There is prior median sternotomy. The osseous structures are unremarkable. IMPRESSION: No active cardiopulmonary disease. Electronically Signed   By: Kathreen Devoid   On: 02/16/2017 10:22   US Abdomen Complete  Result Date: 02/16/2017 CLINICAL DATA:  Right upper quadrant pain for 3 weeks. EXAM: ABDOMEN ULTRASOUND COMPLETE COMPARISON:  CT scan October 21, 2014 FINDINGS: Gallbladder: There is  gallbladder wall thickening measuring 5 mm. At least 1 discrete 7 mm stone is seen in the gallbladder. I suspect sludge or perhaps other small stones. No pericholecystic fluid or Murphy's sign. Common bile duct: Diameter: 2 mm Liver: The left hepatic lobe was not well seen due to shadowing bowel gas. No focal lesions. Portal vein is patent on color Doppler imaging with normal direction of blood flow towards the liver. IVC: No abnormality visualized. Pancreas: Not well visualized. Spleen: Size and appearance within normal limits. Right Kidney: Length: 13.5 cm. At least 1 discrete 8 mm stone is seen with no hydronephrosis. Left Kidney: Length: 14.3 cm. At least 1 discrete 8 mm stone is identified with no hydronephrosis. Abdominal aorta: No aneurysm visualized. Other findings: None. IMPRESSION: 1. Gallbladder wall thickening. Sludge and at least 1 stone in the gallbladder. No pericholecystic fluid or Murphy's sign. If there is concern for acute cholecystitis, a HIDA scan could further evaluate. 2. Nonobstructive renal stones identified. Electronically Signed   By: Dorise Bullion III M.D   On: 02/16/2017 11:01   Ct Abdomen Pelvis W Contrast  Result Date: 02/16/2017 CLINICAL DATA:  Abdominal pain. EXAM: CT ABDOMEN AND PELVIS WITH CONTRAST TECHNIQUE: Multidetector CT imaging of the abdomen and pelvis was performed using the standard protocol following bolus administration of intravenous contrast. CONTRAST:  146mL ISOVUE-300 IOPAMIDOL (ISOVUE-300) INJECTION 61% COMPARISON:  CT scan October 21, 2014 FINDINGS: Lower chest: There is opacity in the lingula which could represent atelectasis or  infiltrate. Rounded opacity in the right lung on series 5, image 9 is at the site of previous fluid, possibly representing a small amount of residual fluid in this region. Recommend attention on follow-up. Cardiomegaly. The patient has an LVAD device. Coronary artery calcifications are identified. Hepatobiliary: The liver is  unremarkable. The gallbladder appears thick walled with cholelithiasis. There is some mild stranding adjacent to the gallbladder. The portal vein is patent. Pancreas: There is stranding adjacent to the pancreatic head and uncinate process. Given the elevated LFTs, this is likely due to pancreatitis rather than adjacent duodenitis. Spleen: The irregular spleen contour is stable. No acute splenic abnormalities. Adrenals/Urinary Tract: The adrenal glands are normal. There is a nonobstructive stone in the right kidney and at least 2 on the left. No suspicious renal masses. No ureterectasis or ureteral stones. The bladder is unremarkable. Stomach/Bowel: The stomach is normal. There is stranding near the pancreatic head and third portion the duodenum, thought to arise from the pancreas. The small bowel is otherwise normal. Colonic diverticulosis is seen without diverticulitis. Visualized appendix is normal. Vascular/Lymphatic: Atherosclerotic changes are seen in the non aneurysmal aorta. No adenopathy. Reproductive: Prostate is unremarkable. Other: The fluid collection seen anterior to the stomach on the previous study no longer contains air and is much smaller in the interval measuring 6.4 by a 2.0 cm. No free air. No other fluid collection or free fluid. Musculoskeletal: No acute bony changes. IMPRESSION: 1. Stranding adjacent to the pancreatic head and uncinate process is consistent with pancreatitis, correlating with the elevated lipase. 2. The gallbladder is contracted with a thickened wall, at least 1 stone, and mild adjacent stranding. The findings are concerning for cholecystitis. A HIDA scan could further evaluate if the clinical picture is ambiguous. 3. Opacity in the lingula could represent atelectasis or infiltrate. Rounded opacity in the right base has decreased since a previous study, likely representing a small amount of residual fluid. 4. The patient has an LVAD ascites. 5. The fluid collection anterior to  the stomach is much smaller in the interval and no longer contains air. 6. Nonobstructive stones in the kidneys. 7. Colonic diverticulosis without diverticulitis. 8. Atherosclerotic change in the non aneurysmal aorta. Aortic Atherosclerosis (ICD10-I70.0). Electronically Signed   By: Dorise Bullion III M.D   On: 02/16/2017 12:02   Nm Hepato W/eject Fract  Result Date: 02/16/2017 CLINICAL DATA:  Abdominal pain after eating. EXAM: NUCLEAR MEDICINE HEPATOBILIARY IMAGING TECHNIQUE: Sequential images of the abdomen were obtained out to 60 minutes following intravenous administration of radiopharmaceutical. RADIOPHARMACEUTICALS:  5.2 mCi Tc-11m  Choletec IV COMPARISON:  Ultrasound and CT imaging from earlier today. FINDINGS: There is prompt uptake in the liver with normal excretion the bowel. The gallbladder never filled throughout the study, despite the administration of 3 mg of morphine. No other abnormalities. IMPRESSION: The gallbladder did not fill. Given the findings on recent CT and ultrasound imaging, the findings are consistent with cystic duct obstruction and cholecystitis. These results will be called to the ordering clinician or representative by the Radiologist Assistant, and communication documented in the PACS or zVision Dashboard. Electronically Signed   By: Dorise Bullion III M.D   On: 02/16/2017 18:30   Dg Abd 2 Views  Result Date: 02/16/2017 CLINICAL DATA:  Right upper quadrant pain EXAM: ABDOMEN - 2 VIEW COMPARISON:  None. FINDINGS: Small bowel dilatation measuring up to 4 cm in diameter with an air-fluid level concerning for a small bowel obstruction. There is no evidence of pneumoperitoneum, portal venous  gas or pneumatosis. There are no pathologic calcifications along the expected course of the ureters. The osseous structures are unremarkable. IMPRESSION: Small bowel dilatation measuring up to 4 cm in diameter with an air-fluid level concerning for a small bowel obstruction. Electronically  Signed   By: Kathreen Devoid   On: 02/16/2017 10:25    Anti-infectives: Anti-infectives    Start     Dose/Rate Route Frequency Ordered Stop   02/16/17 2200  piperacillin-tazobactam (ZOSYN) IVPB 3.375 g     3.375 g 12.5 mL/hr over 240 Minutes Intravenous Every 8 hours 02/16/17 2147     02/16/17 0945  piperacillin-tazobactam (ZOSYN) IVPB 3.375 g     3.375 g 100 mL/hr over 30 Minutes Intravenous  Once 02/16/17 5638 02/16/17 1147      Assessment/Plan: Resolving gsp and cholecystitis  It appears he has resolving gsp and cholecystitis by hida. This is all better now.  His inr still above 2 today. He is on abx. I think he needs lap chole for this.  If he just had gsp might be reasonable to discuss ercp with spincterotomy to have decreased procedural risk but with cholecystitis I think surgery better option. I think cholecystostomy would not be definitive and would just delay.  Only reason to do this would be if medically he were in better position at some point in future which I dont think will be case.  I discussed with him lap chole this admission once inr is less than 1.5 through heparin window. There is certainly some risk to surgery especially cardiac, injury to vad/drive line, bleeding. We discussed other risks and recovery as well.  I think fine to let him have clear liquids today and would just make npo after mn pending inr check but I think likely Wednesday given his pancreatitis resolution and inr.  Will plan for then pending conversation with cardiology.   [facility] 02/18/2017

## 2017-02-18 NOTE — Progress Notes (Signed)
[place] for Heparin when INR < 2. Indication: LVAD  No Known Allergies  Patient Measurements: Height: 5\' 10"  (177.8 cm) Weight: 184 lb 8.4 oz (83.7 kg) IBW/kg (Calculated) : 73 Heparin Dosing Weight:   Vital Signs: Temp: 98.5 F (36.9 C) (08/20 0812) Temp Source: Oral (08/20 0419) BP: 70/55 (08/20 0419) Pulse Rate: 91 (08/20 0419)  Labs:  Recent Labs  02/16/17 0902  02/17/17 0730 02/17/17 1812 02/18/17 0212  HGB 13.2  --  12.2*  --  12.1*  HCT 39.9  --  37.2*  --  38.6*  PLT 271  --  273  --  327  LABPROT  --   < > 25.5* 25.2* 24.4*  INR  --   < > 2.28 2.24 2.16  CREATININE 1.03  --  0.92  --  1.08  < > = values in this interval not displayed.  Estimated Creatinine Clearance: 66.7 mL/min (by C-G formula based on SCr of 1.08 mg/dL).   Medical History: Past Medical History:  Diagnosis Date  . Acute on chronic respiratory failure ([place])    a. 08/2014 in setting of PE.  Marland Kitchen Ankylosing spondylitis ([place])   . Bilateral pulmonary embolism ([place])    a. 08/2014 - started on Coumadin. Retrievable IVC filter placed 08/27/14 due to RV strain and large clot burden.  Marland Kitchen CAD (coronary artery disease)    a. stenting of LCx 2013; b. STEMI 06/12/14 s/p PCI to LAD complicated by post cath shock requiring IABP; VT s/p DCCV, EF 20%; c. NSTEMI 06/26/14 treated medically.  . Carotid artery disease ([place])    a. s/p stenting.  . Chronic systolic CHF (congestive heart failure) ([place])   . Diabetes ([place])   . Hemoptysis    a. 08/2014 possibly due to pulm infarct/PE.  Marland Kitchen Hyperlipidemia   . Hypertension   . Hypotension   . Ischemic cardiomyopathy    a. echo 08/23/2014 EF <20%, dilated CM, mod MR/TR  . Leukocytosis   . Leukocytosis 09/10/2014  . Nephrolithiasis   . Pleural effusion on right 08/2014 - small  . Reactive thrombocytosis 09/10/2014  . Right leg DVT ([place])    a. 08/2014.  Marland Kitchen Ventricular tachycardia ([place])    a. 06/2014 at time of MI, s/p DCCV.     Medications:  Infusions:  . dextrose 5 % and 0.45% NaCl 50 mL/hr at 02/18/17 0128  . piperacillin-tazobactam (ZOSYN)  IV 3.375 g (02/18/17 7846)    Assessment: [name] admitted with gallstone pancreatitis.  INR 2.16 slowly drifting down Coumadin placed on hold while awaiting surgery possibly Wed when INR < 1.5  Pharmacy asked to begin IV heparin once INR < 2.  No Coumadin given since admission.    Goal of Therapy:  Heparin level 0.3-0.5 Monitor platelets by anticoagulation protocol: Yes   Plan:  Daily INR Start IV heparin with no bolus once INR < 2 - probably in am  Will target lower end of heparin goal range.    Bonnita Nasuti Pharm.D. CPP, BCPS Clinical Pharmacist [phone] 02/18/2017 9:41 AM

## 2017-02-19 LAB — COMPREHENSIVE METABOLIC PANEL
ALBUMIN: 2.8 g/dL — AB (ref 3.5–5.0)
ALK PHOS: 221 U/L — AB (ref 38–126)
ALT: 120 U/L — ABNORMAL HIGH (ref 17–63)
AST: 82 U/L — AB (ref 15–41)
Anion gap: 7 (ref 5–15)
BILIRUBIN TOTAL: 1.3 mg/dL — AB (ref 0.3–1.2)
BUN: 15 mg/dL (ref 6–20)
CALCIUM: 8.4 mg/dL — AB (ref 8.9–10.3)
CO2: 23 mmol/L (ref 22–32)
Chloride: 105 mmol/L (ref 101–111)
Creatinine, Ser: 0.99 mg/dL (ref 0.61–1.24)
GFR calc Af Amer: >60 mL/min (ref >60)
Glucose, Bld: 97 mg/dL (ref 65–99)
POTASSIUM: 3.5 mmol/L (ref 3.5–5.1)
Sodium: 135 mmol/L (ref 135–145)
TOTAL PROTEIN: 6.9 g/dL (ref 6.5–8.1)

## 2017-02-19 LAB — PROTIME-INR
INR: 2.25
INR: 1.78
Prothrombin Time: 25.3 seconds — ABNORMAL HIGH (ref 11.4–15.2)
Prothrombin Time: 20.9 seconds — ABNORMAL HIGH (ref 11.4–15.2)

## 2017-02-19 LAB — CBC
HEMATOCRIT: 36.3 % — AB (ref 39.0–52.0)
HEMOGLOBIN: 11.8 g/dL — AB (ref 13.0–17.0)
MCH: 28.9 pg (ref 26.0–34.0)
MCHC: 32.5 g/dL (ref 30.0–36.0)
MCV: 89 fL (ref 78.0–100.0)
Platelets: 358 10*3/uL (ref 150–400)
RBC: 4.08 MIL/uL — ABNORMAL LOW (ref 4.22–5.81)
RDW: 15.8 % — AB (ref 11.5–15.5)
WBC: 16.1 10*3/uL — AB (ref 4.0–10.5)

## 2017-02-19 LAB — TYPE AND SCREEN
ABO/RH(D): A POS
Antibody Screen: NEGATIVE

## 2017-02-19 LAB — LACTATE DEHYDROGENASE: LDH: 243 U/L — ABNORMAL HIGH (ref 98–192)

## 2017-02-19 LAB — LIPASE, BLOOD: LIPASE: 71 U/L — AB (ref 11–51)

## 2017-02-19 MED ORDER — PHYTONADIONE 5 MG PO TABS
2.5000 mg | ORAL_TABLET | Freq: Once | ORAL | Status: AC
Start: 2017-02-19 — End: 2017-02-19
  Administered 2017-02-19: 2.5 mg via ORAL
  Filled 2017-02-19: qty 1

## 2017-02-19 MED ORDER — HEPARIN (PORCINE) IN NACL 100-0.45 UNIT/ML-% IJ SOLN
1300.0000 [IU]/h | INTRAMUSCULAR | Status: DC
  Administered 2017-02-19: 1100 [IU]/h via INTRAVENOUS
  Filled 2017-02-19: qty 250

## 2017-02-19 NOTE — Progress Notes (Signed)
[place] for Heparin now that INR < 2. Indication: LVAD  No Known Allergies  Patient Measurements: Height: 5\' 10"  (177.8 cm) Weight: 185 lb 3 oz (84 kg) IBW/kg (Calculated) : 73 Heparin Dosing Weight:   Vital Signs: Temp: 99 F (37.2 C) (08/21 1627) Temp Source: Oral (08/21 1627) BP: 75/56 (08/21 1627) Pulse Rate: 94 (08/21 1627)  Labs:  Recent Labs  02/17/17 0730  02/18/17 0212 02/19/17 0459 02/19/17 1827  HGB 12.2*  --  12.1* 11.8*  --   HCT 37.2*  --  38.6* 36.3*  --   PLT 273  --  327 358  --   LABPROT 25.5*  < > 24.4* 25.3* 20.9*  INR 2.28  < > 2.16 2.25 1.78  CREATININE 0.92  --  1.08 0.99  --   < > = values in this interval not displayed.  Estimated Creatinine Clearance: 72.7 mL/min (by C-G formula based on SCr of 0.99 mg/dL).   Medical History: Past Medical History:  Diagnosis Date  . Acute on chronic respiratory failure ([place])    a. 08/2014 in setting of PE.  Marland Kitchen Ankylosing spondylitis ([place])   . Bilateral pulmonary embolism ([place])    a. 08/2014 - started on Coumadin. Retrievable IVC filter placed 08/27/14 due to RV strain and large clot burden.  Marland Kitchen CAD (coronary artery disease)    a. stenting of LCx 2013; b. STEMI 06/12/14 s/p PCI to LAD complicated by post cath shock requiring IABP; VT s/p DCCV, EF 20%; c. NSTEMI 06/26/14 treated medically.  . Carotid artery disease ([place])    a. s/p stenting.  . Chronic systolic CHF (congestive heart failure) ([place])   . Diabetes ([place])   . Hemoptysis    a. 08/2014 possibly due to pulm infarct/PE.  Marland Kitchen Hyperlipidemia   . Hypertension   . Hypotension   . Ischemic cardiomyopathy    a. echo 08/23/2014 EF <20%, dilated CM, mod MR/TR  . Leukocytosis   . Leukocytosis 09/10/2014  . Nephrolithiasis   . Pleural effusion on right 08/2014 - small  . Reactive thrombocytosis 09/10/2014  . Right leg DVT ([place])    a. 08/2014.  Marland Kitchen Ventricular tachycardia ([place])    a. 06/2014 at time of MI, s/p DCCV.     Medications:  Infusions:  . dextrose 5 % and 0.45% NaCl 50 mL/hr at 02/18/17 0800  . piperacillin-tazobactam (ZOSYN)  IV Stopped (02/19/17 1805)    Assessment: [name] admitted with gallstone pancreatitis.    INR 2.25 slowly drifting down, Coumadin placed on hold while awaiting surgery possibly Wed when INR < 1.5.Pharmacy asked to begin IV heparin once INR < 2 which it is tonight. No Coumadin given since admission.   Goal of Therapy:  Heparin level 0.3-0.5 INR 2-2.5 Monitor platelets by anticoagulation protocol: Yes   Plan:  Start heparin at 1100 units/hr Check 8 hours heparin level  Erin Hearing PharmD., BCPS Clinical Pharmacist Pager [phone] 02/19/2017 7:41 PM

## 2017-02-19 NOTE — Progress Notes (Signed)
Advanced Heart Failure VAD Team Note  Subjective:    Admitted with acute gallstone pancreatitis.    U/S, CT and HIDA scan all reviewed personally. No evidence of CBD so likely passed stone though there is evidence of ongoing cystic duct obstruction. GSU has seen. Recommending continued bowel rest with possible cholecystectomy vs C-tube this week  Per Dr Donne Hazel plan for lap cholecystectomy tomorrow. WBC 16. INR 2.25   Denies SOB. Denies abdominal pain.   LVAD INTERROGATION:  HeartMate II LVAD:  Flow  5 liters/min, speed 9200, power 5, PI 4.9 Occasional PI events.    Objective:    Vital Signs:   Temp:  [97.9 F (36.6 C)-99.2 F (37.3 C)] 98.4 F (36.9 C) (08/21 0826) Pulse Rate:  [79-99] 85 (08/21 0826) Resp:  [19-29] 24 (08/21 0826) BP: (85-98)/(36-85) 90/76 (08/21 0826) SpO2:  [92 %-99 %] 96 % (08/21 0826) Weight:  [185 lb 3 oz (84 kg)] 185 lb 3 oz (84 kg) (08/21 0404) Last BM Date: 02/15/17 Mean arterial Pressure 70-80s   Intake/Output:   Intake/Output Summary (Last 24 hours) at 02/19/17 0917 Last data filed at 02/19/17 0639  Gross per 24 hour  Intake             1250 ml  Output              975 ml  Net              275 ml     Physical Exam    Physical Exam: GENERAL: NAD. Sitting in the chair.  HEENT: normal  NECK: Supple, JVP 6-7. Carotids 2+ bilaterally, no bruits.  No lymphadenopathy or thyromegaly appreciated.   CARDIAC:  Mechanical heart sounds with LVAD hum present.  LUNGS:  Decreased in the bases.   ABDOMEN:  Soft, round, nontender, positive bowel sounds x4.     LVAD exit site: well-healed and incorporated.  Dressing dry and intact.  No erythema or drainage.  Stabilization device present and accurately applied.  Driveline dressing is being changed daily per sterile technique. EXTREMITIES:  Warm and dry, no cyanosis, clubbing, rash or edema  NEUROLOGIC:  Alert and oriented x 4.  Gait steady.  No aphasia.  No dysarthria.  Affect pleasant.        Telemetry   Personally reviewed, NSR  Labs   Basic Metabolic Panel:  Recent Labs Lab 02/16/17 0902 02/17/17 0730 02/18/17 0212 02/19/17 0459  NA 136 138 138 135  K 4.3 3.9 4.3 3.5  CL 103 105 104 105  CO2 24 23 28 23   GLUCOSE 143* 80 75 97  BUN 15 16 17 15   CREATININE 1.03 0.92 1.08 0.99  CALCIUM 9.1 8.9 8.9 8.4*    Liver Function Tests:  Recent Labs Lab 02/16/17 0902 02/17/17 0730 02/18/17 0212 02/19/17 0459  AST 163* 130* 112* 82*  ALT 164* 154* 144* 120*  ALKPHOS 332* 270* 240* 221*  BILITOT 2.5* 1.7* 1.5* 1.3*  PROT 7.9 7.2 7.0 6.9  ALBUMIN 3.8 3.2* 3.1* 2.8*    Recent Labs Lab 02/16/17 0902 02/17/17 0730 02/18/17 0212 02/19/17 0459  LIPASE 868* 109* 99* 71*   No results for input(s): AMMONIA in the last 168 hours.  CBC:  Recent Labs Lab 02/16/17 0902 02/17/17 0730 02/18/17 0212 02/19/17 0459  WBC 18.1* 18.4* 17.6* 16.1*  HGB 13.2 12.2* 12.1* 11.8*  HCT 39.9 37.2* 38.6* 36.3*  MCV 89.5 89.9 90.0 89.0  PLT 271 273 327 358    INR:  Recent  Labs Lab 02/16/17 2157 02/17/17 0730 02/17/17 1812 02/18/17 0212 02/19/17 0459  INR 2.24 2.28 2.24 2.16 2.25    Other results:     Imaging   No results found.   Medications:     Scheduled Medications: . aspirin EC  81 mg Oral Daily  . pantoprazole (PROTONIX) IV  40 mg Intravenous Q24H  . phytonadione  2.5 mg Oral Once  . rosuvastatin  10 mg Oral QHS  . traZODone  100 mg Oral QHS    Infusions: . dextrose 5 % and 0.45% NaCl 50 mL/hr at 02/18/17 0800  . piperacillin-tazobactam (ZOSYN)  IV 3.375 g (02/19/17 0639)    PRN Medications: acetaminophen, clobetasol cream, cyclobenzaprine, ondansetron (ZOFRAN) IV, traMADol   Patient Profile   71 y/o male with severe iCM and systolic HF s/p HM-II LVAD (3/16) admitted 8/18 with acute gallstone pancreatitis.    Assessment/Plan:    1. Acute gallstone pancreatitis - Symptomatically improved. No tenderness on exam. LFTs and  lipase continue to trend down.  - u/s, CT and HIDA scan all previously reviewed personally. No evidence of CBD so likely passed stone though there is evidence of ongoing cystic duct obstruction - Dr Donne Hazel plans for lap cholecystectomy tomorrow.  - D/w Dr. [name] who will d/w surgical team as well  - Coumadin on hold. Plan heparin when INR <2.0. Per Dr Haroldine Laws give 2.5 mg vitamin K today.  - Continue zosyn  2. Chronic systolic HF - s/p VAD 6/76 - HF perspective he is stable.  VAD parameters stable - Coumadin on hold.  -Plan heparin when INR < 2.0. INR 2.25.   3. CAD - Stable. No s/s Ischemia.   4. Ankylosing spondylitis - Stable. Continue pain meds prn.   I reviewed the LVAD parameters from today, and compared the results to the patient's prior recorded data.  No programming changes were made.  The LVAD is functioning within specified parameters.  The patient performs LVAD self-test daily.  LVAD interrogation was negative for any significant power changes, alarms or PI events/speed drops.  LVAD equipment check completed and is in good working order.  Back-up equipment present.   LVAD education done on emergency procedures and precautions and reviewed exit site care.   Length of Stay: 3  Darrick Grinder, NP 02/19/2017, 9:17 AM  VAD Team --- VAD ISSUES ONLY--- Pager [phone] (7am - 7am)  Advanced Heart Failure Team  Pager [phone] (M-F; 7a - 4p)  Please contact [facility] for night-coverage after hours (4p -7a ) and weekends on [email or web address]  Patient seen and examined with Darrick Grinder, NP. We discussed all aspects of the encounter. I agree with the assessment and plan as stated above.   Patient seen and examined with Darrick Grinder, NP. We discussed all aspects of the encounter. I agree with the assessment and plan as stated above.   Symptomatically stable. D/w with GSU. Plan for cholecystectomy tomorrow if INR down ~1.5. Will give 2mg  vitamin K and recheck INR this afternoon. If INR  still up can give FFP in am.   Volume and VAD parameters stable.   [name], MD  1:51 PM

## 2017-02-19 NOTE — Progress Notes (Signed)
[place] for Heparin when INR < 2. Indication: LVAD  No Known Allergies  Patient Measurements: Height: 5\' 10"  (177.8 cm) Weight: 185 lb 3 oz (84 kg) IBW/kg (Calculated) : 73 Heparin Dosing Weight:   Vital Signs: Temp: 98.4 F (36.9 C) (08/21 0826) Temp Source: Oral (08/21 0826) BP: 90/76 (08/21 0826) Pulse Rate: 85 (08/21 0826)  Labs:  Recent Labs  02/17/17 0730 02/17/17 1812 02/18/17 0212 02/19/17 0459  HGB 12.2*  --  12.1* 11.8*  HCT 37.2*  --  38.6* 36.3*  PLT 273  --  327 358  LABPROT 25.5* 25.2* 24.4* 25.3*  INR 2.28 2.24 2.16 2.25  CREATININE 0.92  --  1.08 0.99    Estimated Creatinine Clearance: 72.7 mL/min (by C-G formula based on SCr of 0.99 mg/dL).   Medical History: Past Medical History:  Diagnosis Date  . Acute on chronic respiratory failure ([place])    a. 08/2014 in setting of PE.  Marland Kitchen Ankylosing spondylitis ([place])   . Bilateral pulmonary embolism ([place])    a. 08/2014 - started on Coumadin. Retrievable IVC filter placed 08/27/14 due to RV strain and large clot burden.  Marland Kitchen CAD (coronary artery disease)    a. stenting of LCx 2013; b. STEMI 06/12/14 s/p PCI to LAD complicated by post cath shock requiring IABP; VT s/p DCCV, EF 20%; c. NSTEMI 06/26/14 treated medically.  . Carotid artery disease ([place])    a. s/p stenting.  . Chronic systolic CHF (congestive heart failure) ([place])   . Diabetes ([place])   . Hemoptysis    a. 08/2014 possibly due to pulm infarct/PE.  Marland Kitchen Hyperlipidemia   . Hypertension   . Hypotension   . Ischemic cardiomyopathy    a. echo 08/23/2014 EF <20%, dilated CM, mod MR/TR  . Leukocytosis   . Leukocytosis 09/10/2014  . Nephrolithiasis   . Pleural effusion on right 08/2014 - small  . Reactive thrombocytosis 09/10/2014  . Right leg DVT ([place])    a. 08/2014.  Marland Kitchen Ventricular tachycardia ([place])    a. 06/2014 at time of MI, s/p DCCV.    Medications:  Infusions:  . dextrose 5 % and 0.45% NaCl 50 mL/hr at  02/18/17 0800  . piperacillin-tazobactam (ZOSYN)  IV 3.375 g (02/19/17 4132)    Assessment: [name] admitted with gallstone pancreatitis.  INR 2.25 slowly drifting down Coumadin placed on hold while awaiting surgery possibly Wed when INR < 1.5  Pharmacy asked to begin IV heparin once INR < 2.  No Coumadin given since admission.  Discussed with MD will give vitK 2.5mg pox1 and recheck INR this evening  Goal of Therapy:  Heparin level 0.3-0.5 INR 2-2.5 Monitor platelets by anticoagulation protocol: Yes   Plan:  Vit K 2.5mg  po x1  INR at 1800 Daily INR Start IV heparin with no bolus once INR < 2 - probably in am  Will target lower end of heparin goal range.    Bonnita Nasuti Pharm.D. CPP, BCPS Clinical Pharmacist [phone] 02/19/2017 9:27 AM

## 2017-02-19 NOTE — Progress Notes (Signed)
LVAD Coordinator Rounding Note:  Admitted 8/19 due to nausea and vomiting. Pt has acute gallstone pancreatitis.  Heartmate II LVAD implanted on 09/21/14 by PVT under Destination therapy criteria.  Vital signs: HR: 87 Doppler Pressure:80 Automatic BP: 90/76 (83) O2 Sat: 96 Wt: 184>185lbs     LVAD interrogation reveals:   Speed: 9200 Flow: 4.6 Power: 5.4 PI: 5.3 Alarms: none Events:  Rare PI Hematocrit: 36.6 Fixed speed: 9200 Low speed limit: 8600   Drive Line: To be changed by pts wife.  Labs:  LDH trend: 264>243  INR trend: 2.16>2.25 Anticoagulation Plan: -INR Goal: 2-2.5 -ASA Dose: 81 mg  Blood Products:  none  Device: none  Renal:  -BUN/CRT: 17/1.08   Adverse Events on VAD: -8/19-admitted for acute gallstone pancreatitis  Plan/Recommendations:   1. Awaiting INR to drift to 1.5 for cholecystectomy, likely tomorrow pending INR. VAD Coordinator will accompany pt to the OR. 2. Heart Failure team will admit to reverse INR with Vit K.  3. Page VAD Coordinator for any equipment issues.  [name] RN, BSN  VAD Coordinator 24/7 pager [phone]

## 2017-02-19 NOTE — Progress Notes (Signed)
   Subjective/Chief Complaint: No abd pain, feels fine   Objective: Vital signs in last 24 hours: Temp:  [97.9 F (36.6 C)-99.2 F (37.3 C)] 98.4 F (36.9 C) (08/21 0826) Pulse Rate:  [79-99] 85 (08/21 0826) Resp:  [19-29] 24 (08/21 0826) BP: (85-98)/(36-85) 90/76 (08/21 0826) SpO2:  [92 %-99 %] 96 % (08/21 0826) Weight:  [84 kg (185 lb 3 oz)] 84 kg (185 lb 3 oz) (08/21 0404) Last BM Date: 02/15/17  Intake/Output from previous day: 08/20 0701 - 08/21 0700 In: 1350 [P.O.:150; I.V.:1050; IV Piggyback:150] Out: 975 [Urine:975] Intake/Output this shift: No intake/output data recorded.  GI: soft nt/nd  Lab Results:   Recent Labs  02/18/17 0212 02/19/17 0459  WBC 17.6* 16.1*  HGB 12.1* 11.8*  HCT 38.6* 36.3*  PLT 327 358   BMET  Recent Labs  02/18/17 0212 02/19/17 0459  NA 138 135  K 4.3 3.5  CL 104 105  CO2 28 23  GLUCOSE 75 97  BUN 17 15  CREATININE 1.08 0.99  CALCIUM 8.9 8.4*   PT/INR  Recent Labs  02/18/17 0212 02/19/17 0459  LABPROT 24.4* 25.3*  INR 2.16 2.25   ABG No results for input(s): PHART, HCO3 in the last 72 hours.  Invalid input(s): PCO2, PO2  Studies/Results: No results found.  Anti-infectives: Anti-infectives    Start     Dose/Rate Route Frequency Ordered Stop   02/16/17 2200  piperacillin-tazobactam (ZOSYN) IVPB 3.375 g     3.375 g 12.5 mL/hr over 240 Minutes Intravenous Every 8 hours 02/16/17 2147     02/16/17 0945  piperacillin-tazobactam (ZOSYN) IVPB 3.375 g     3.375 g 100 mL/hr over 30 Minutes Intravenous  Once 02/16/17 4580 02/16/17 1147      Assessment/Plan: Resolving gsp/cholecystitis  I still think needs lap chole. Will plan for tomorrow pending inr as discussed yesterday.   [facility] 02/19/2017

## 2017-02-20 ENCOUNTER — Encounter (HOSPITAL_COMMUNITY)
Admission: EM | Disposition: A | Discharge status: Home / Self Care | Payer: Self-pay | Source: Home / Self Care | Attending: Internal Medicine

## 2017-02-20 ENCOUNTER — Encounter (HOSPITAL_COMMUNITY): Payer: Self-pay

## 2017-02-20 LAB — PREPARE FRESH FROZEN PLASMA
Unit division: 0
Unit division: 0

## 2017-02-20 LAB — CBC
HCT: 34.5 % — ABNORMAL LOW (ref 39.0–52.0)
Hemoglobin: 11 g/dL — ABNORMAL LOW (ref 13.0–17.0)
MCH: 28.6 pg (ref 26.0–34.0)
MCHC: 31.9 g/dL (ref 30.0–36.0)
MCV: 89.6 fL (ref 78.0–100.0)
PLATELETS: 348 10*3/uL (ref 150–400)
RBC: 3.85 MIL/uL — ABNORMAL LOW (ref 4.22–5.81)
RDW: 16 % — AB (ref 11.5–15.5)
WBC: 12.7 10*3/uL — ABNORMAL HIGH (ref 4.0–10.5)

## 2017-02-20 LAB — COMPREHENSIVE METABOLIC PANEL
ALT: 96 U/L — ABNORMAL HIGH (ref 17–63)
ANION GAP: 6 (ref 5–15)
AST: 60 U/L — ABNORMAL HIGH (ref 15–41)
Albumin: 2.7 g/dL — ABNORMAL LOW (ref 3.5–5.0)
Alkaline Phosphatase: 185 U/L — ABNORMAL HIGH (ref 38–126)
BUN: 11 mg/dL (ref 6–20)
CALCIUM: 8.3 mg/dL — AB (ref 8.9–10.3)
CHLORIDE: 105 mmol/L (ref 101–111)
CO2: 24 mmol/L (ref 22–32)
Creatinine, Ser: 0.82 mg/dL (ref 0.61–1.24)
GFR calc non Af Amer: >60 mL/min (ref >60)
Glucose, Bld: 106 mg/dL — ABNORMAL HIGH (ref 65–99)
Potassium: 3.3 mmol/L — ABNORMAL LOW (ref 3.5–5.1)
SODIUM: 135 mmol/L (ref 135–145)
Total Bilirubin: 1.2 mg/dL (ref 0.3–1.2)
Total Protein: 6.4 g/dL — ABNORMAL LOW (ref 6.5–8.1)

## 2017-02-20 LAB — HEPARIN LEVEL (UNFRACTIONATED)
HEPARIN UNFRACTIONATED: 0.15 [IU]/mL — AB (ref 0.30–0.70)
HEPARIN UNFRACTIONATED: 0.16 [IU]/mL — AB (ref 0.30–0.70)

## 2017-02-20 LAB — BPAM FFP
Blood Product Expiration Date: 201808222359
Blood Product Expiration Date: 201808262359
ISSUE DATE / TIME: 201808211014
ISSUE DATE / TIME: 201808211144
Unit Type and Rh: 6200
Unit Type and Rh: 6200

## 2017-02-20 LAB — LIPASE, BLOOD: Lipase: 79 U/L — ABNORMAL HIGH (ref 11–51)

## 2017-02-20 LAB — PROTIME-INR
INR: 1.63
PROTHROMBIN TIME: 19.5 s — AB (ref 11.4–15.2)

## 2017-02-20 LAB — GLUCOSE, CAPILLARY: GLUCOSE-CAPILLARY: 115 mg/dL — AB (ref 65–99)

## 2017-02-20 LAB — LACTATE DEHYDROGENASE: LDH: 211 U/L — ABNORMAL HIGH (ref 98–192)

## 2017-02-20 SURGERY — LAPAROSCOPIC CHOLECYSTECTOMY WITH INTRAOPERATIVE CHOLANGIOGRAM
Anesthesia: General

## 2017-02-20 MED ORDER — POTASSIUM CHLORIDE CRYS ER 20 MEQ PO TBCR
30.0000 meq | EXTENDED_RELEASE_TABLET | Freq: Two times a day (BID) | ORAL | Status: AC
  Administered 2017-02-20 (×2): 30 meq via ORAL
  Filled 2017-02-20 (×2): qty 1

## 2017-02-20 MED ORDER — HEPARIN (PORCINE) IN NACL 100-0.45 UNIT/ML-% IJ SOLN
1500.0000 [IU]/h | INTRAMUSCULAR | Status: DC
  Filled 2017-02-20: qty 250

## 2017-02-20 MED ORDER — PROPOFOL 10 MG/ML IV BOLUS
INTRAVENOUS | Status: AC
  Filled 2017-02-20: qty 20

## 2017-02-20 MED ORDER — MIDAZOLAM HCL 2 MG/2ML IJ SOLN
INTRAMUSCULAR | Status: AC
  Filled 2017-02-20: qty 2

## 2017-02-20 MED ORDER — FENTANYL CITRATE (PF) 250 MCG/5ML IJ SOLN
INTRAMUSCULAR | Status: AC
  Filled 2017-02-20: qty 5

## 2017-02-20 MED ORDER — TRAMADOL HCL 50 MG PO TABS
50.0000 mg | ORAL_TABLET | Freq: Four times a day (QID) | ORAL | Status: DC | PRN
  Administered 2017-02-20 – 2017-02-22 (×2): 50 mg via ORAL
  Administered 2017-02-23: 100 mg via ORAL
  Filled 2017-02-20: qty 2
  Filled 2017-02-20 (×2): qty 1

## 2017-02-20 NOTE — Progress Notes (Signed)
[place] for Heparin while INR < 2. Indication: LVAD  No Known Allergies  Patient Measurements: Height: 5\' 10"  (177.8 cm) Weight: 184 lb 3.2 oz (83.6 kg) IBW/kg (Calculated) : 73 Heparin Dosing Weight: 84 kg   Vital Signs: Temp: 98.9 F (37.2 C) (08/22 1128) Temp Source: Oral (08/22 1128) BP: 94/77 (08/22 1021) Pulse Rate: 80 (08/22 1021)  Labs:  Recent Labs  02/18/17 0212 02/19/17 0459 02/19/17 1827 02/20/17 0443 02/20/17 1311  HGB 12.1* 11.8*  --  11.0*  --   HCT 38.6* 36.3*  --  34.5*  --   PLT 327 358  --  348  --   LABPROT 24.4* 25.3* 20.9* 19.5*  --   INR 2.16 2.25 1.78 1.63  --   HEPARINUNFRC  --   --   --  0.15* 0.16*  CREATININE 1.08 0.99  --  0.82  --     Estimated Creatinine Clearance: 87.8 mL/min (by C-G formula based on SCr of 0.82 mg/dL).   Medical History: Past Medical History:  Diagnosis Date  . Acute on chronic respiratory failure ([place])    a. 08/2014 in setting of PE.  Marland Kitchen Ankylosing spondylitis ([place])   . Bilateral pulmonary embolism ([place])    a. 08/2014 - started on Coumadin. Retrievable IVC filter placed 08/27/14 due to RV strain and large clot burden.  Marland Kitchen CAD (coronary artery disease)    a. stenting of LCx 2013; b. STEMI 06/12/14 s/p PCI to LAD complicated by post cath shock requiring IABP; VT s/p DCCV, EF 20%; c. NSTEMI 06/26/14 treated medically.  . Carotid artery disease ([place])    a. s/p stenting.  . Chronic systolic CHF (congestive heart failure) ([place])   . Diabetes ([place])   . Hemoptysis    a. 08/2014 possibly due to pulm infarct/PE.  Marland Kitchen Hyperlipidemia   . Hypertension   . Hypotension   . Ischemic cardiomyopathy    a. echo 08/23/2014 EF <20%, dilated CM, mod MR/TR  . Leukocytosis   . Leukocytosis 09/10/2014  . Nephrolithiasis   . Pleural effusion on right 08/2014 - small  . Reactive thrombocytosis 09/10/2014  . Right leg DVT ([place])    a. 08/2014.  Marland Kitchen Ventricular tachycardia ([place])    a. 06/2014 at time of  MI, s/p DCCV.    Medications:  Infusions:  . dextrose 5 % and 0.45% NaCl 50 mL/hr at 02/20/17 1100  . heparin    . piperacillin-tazobactam (ZOSYN)  IV 3.375 g (02/20/17 1326)    Assessment: [name] admitted with gallstone pancreatitis. Coumadin placed on hold while awaiting possible surgery Wednesday when INR < 1.5.  INR 1.63 today s/p 2.5 mg PO vitamin K and 1 unit FFP on 8/21. Pharmacy asked to begin IV heparin once INR < 2.   Heparin level is subtherapeutic at 0.16. No issues with heparin infusion overnight per RN. CBC stable and no over s/s bleeding noted.   Goal of Therapy:  Heparin level 0.3-0.5 INR 2-2.5 Monitor platelets by anticoagulation protocol: Yes   Plan:  Increase heparin gtt to 1500 units/hr Check 6 hour heparin level Daily heparin level and CBC Monitor for s/s bleeding  Planning for OR tomorrow  Uvaldo Rising, BCPS  Clinical Pharmacist Pager [phone]  02/20/2017 2:50 PM

## 2017-02-20 NOTE — Progress Notes (Addendum)
Advanced Heart Failure VAD Team Note  Subjective:    Admitted with acute gallstone pancreatitis.    U/S, CT and HIDA scan all reviewed personally. No evidence of CBD so likely passed stone though there is evidence of ongoing cystic duct obstruction. GSU has seen. Recommending continued bowel rest with possible cholecystectomy vs C-tube this week  Denies ab pain. Hungry. Cholecystectomy cancelled due to INR 1.6. No CP or SOB.Weight stable   LVAD INTERROGATION:  HeartMate II LVAD:  Flow  5 liters/min, speed 9200, power 5, PI 4.4 Occasional PI events.    Objective:    Vital Signs:   Temp:  [97.9 F (36.6 C)-99.6 F (37.6 C)] 99 F (37.2 C) (08/22 0324) Pulse Rate:  [82-102] 82 (08/22 0324) Resp:  [14-28] 14 (08/22 0324) BP: (65-117)/(46-78) 83/70 (08/22 0324) SpO2:  [93 %-100 %] 97 % (08/22 0324) Weight:  [83.6 kg (184 lb 3.2 oz)] 83.6 kg (184 lb 3.2 oz) (08/22 0500) Last BM Date: 02/15/17 Mean arterial Pressure 70s   Intake/Output:   Intake/Output Summary (Last 24 hours) at 02/20/17 0730 Last data filed at 02/20/17 0500  Gross per 24 hour  Intake          1729.85 ml  Output             1025 ml  Net           704.85 ml     Physical Exam    Physical Exam: General:  NAD. Sitting in chair.  HEENT: normal  Neck: supple. JVP not elevated.  Carotids 2+ bilat; no bruits. No lymphadenopathy or thryomegaly appreciated. Cor: LVAD hum.  Lungs: Clear. Abdomen: obese soft, nontender, non-distended. No hepatosplenomegaly. No bruits or masses. Good bowel sounds. Driveline site clean. Anchor in place.  Extremities: no cyanosis, clubbing, rash. Warm no edema  Neuro: alert & oriented x 3. No focal deficits. Moves all 4 without problem    Telemetry   Personally reviewed, NSR  Labs   Basic Metabolic Panel:  Recent Labs Lab 02/16/17 0902 02/17/17 0730 02/18/17 0212 02/19/17 0459 02/20/17 0443  NA 136 138 138 135 135  K 4.3 3.9 4.3 3.5 3.3*  CL 103 105 104 105 105  CO2  24 23 28 23 24   GLUCOSE 143* 80 75 97 106*  BUN 15 16 17 15 11   CREATININE 1.03 0.92 1.08 0.99 0.82  CALCIUM 9.1 8.9 8.9 8.4* 8.3*    Liver Function Tests:  Recent Labs Lab 02/16/17 0902 02/17/17 0730 02/18/17 0212 02/19/17 0459 02/20/17 0443  AST 163* 130* 112* 82* 60*  ALT 164* 154* 144* 120* 96*  ALKPHOS 332* 270* 240* 221* 185*  BILITOT 2.5* 1.7* 1.5* 1.3* 1.2  PROT 7.9 7.2 7.0 6.9 6.4*  ALBUMIN 3.8 3.2* 3.1* 2.8* 2.7*    Recent Labs Lab 02/16/17 0902 02/17/17 0730 02/18/17 0212 02/19/17 0459 02/20/17 0443  LIPASE 868* 109* 99* 71* 79*   No results for input(s): AMMONIA in the last 168 hours.  CBC:  Recent Labs Lab 02/16/17 0902 02/17/17 0730 02/18/17 0212 02/19/17 0459 02/20/17 0443  WBC 18.1* 18.4* 17.6* 16.1* 12.7*  HGB 13.2 12.2* 12.1* 11.8* 11.0*  HCT 39.9 37.2* 38.6* 36.3* 34.5*  MCV 89.5 89.9 90.0 89.0 89.6  PLT 271 273 327 358 348    INR:  Recent Labs Lab 02/17/17 1812 02/18/17 0212 02/19/17 0459 02/19/17 1827 02/20/17 0443  INR 2.24 2.16 2.25 1.78 1.63    Other results:     Imaging   No  results found.   Medications:     Scheduled Medications: . aspirin EC  81 mg Oral Daily  . pantoprazole (PROTONIX) IV  40 mg Intravenous Q24H  . rosuvastatin  10 mg Oral QHS  . traZODone  100 mg Oral QHS    Infusions: . dextrose 5 % and 0.45% NaCl 50 mL/hr at 02/19/17 2000  . heparin 1,300 Units/hr (02/20/17 0626)  . piperacillin-tazobactam (ZOSYN)  IV 3.375 g (02/20/17 0543)    PRN Medications: acetaminophen, clobetasol cream, cyclobenzaprine, ondansetron (ZOFRAN) IV, traMADol   Patient Profile   71 y/o male with severe iCM and systolic HF s/p HM-II LVAD (3/16) admitted 8/18 with acute gallstone pancreatitis.    Assessment/Plan:    1. Acute gallstone pancreatitis - Symptomatically improved. No tenderness on exam. LFTs and lipase continue to trend down.  - u/s, CT and HIDA scan all previously reviewed personally. No  evidence of CBD so likely passed stone though there is evidence of ongoing cystic duct obstruction - INR 1.6 Surgery cancelled today. Dr Donne Hazel plans for lap cholecystectomy tomorrow.   - Coumadin on hold. On heparin. Rate adjusted per PharmD. Discussed persoanlly - Continue zosyn  2. Chronic systolic HF - s/p VAD 2/95 - Volume status stable. Weight stable. VAD parameters stable.  - Coumadin on hold. Stable on heparin   3. CAD - Stable. No s/s Ischemia.   4. Ankylosing spondylitis - Stable. Continue pain meds prn.   I reviewed the LVAD parameters from today, and compared the results to the patient's prior recorded data.  No programming changes were made.  The LVAD is functioning within specified parameters.  The patient performs LVAD self-test daily.  LVAD interrogation was negative for any significant power changes, alarms or PI events/speed drops.  LVAD equipment check completed and is in good working order.  Back-up equipment present.   LVAD education done on emergency procedures and precautions and reviewed exit site care.   Length of Stay: 4  [name], MD 02/20/2017, 7:30 AM  VAD Team --- VAD ISSUES ONLY--- Pager [phone] (7am - 7am)  Advanced Heart Failure Team  Pager [phone] (M-F; 7a - 4p)  Please contact [facility] for night-coverage after hours (4p -7a ) and weekends on [email or web address]  Patient seen and examined with Darrick Grinder, NP. We discussed all aspects of the encounter. I agree with the assessment and plan as stated above.   He underwent laprascopic cholecystectomy earlier today. It was a complicated procedure as the GB was adherent to the liver and CBD and the liver was quite friable. He is now back in the CCU. He is weak but awake. HF and BP are stable with arterial line in place. We wil continue abx.   I d/w Dr. Donne Hazel and give the intra-operative bleeding we will hold heparin 24 hours. Restart as soon as possible. VAD parameters are stable.     [name], MD  8:44 PM

## 2017-02-20 NOTE — Progress Notes (Signed)
LVAD Coordinator Rounding Note:  Admitted 8/19 with acute gallstone pancreatitis. Pending lap chole per Dr. Donne Hazel.  Heartmate II LVAD implanted on 09/21/14 by PVT under Destination therapy criteria.  Vital signs: HR: 87 Doppler Pressure: 72 Automatic BP: 83/70 (75) O2 Sat: 95 4LNC Wt: 184>185>184lbs   LVAD interrogation reveals:   Speed: 9200 Flow: 5.1 Power: 5.5 PI: 4.4 Alarms: none Events:  1 PI event overnight Fixed speed: 9200 Low speed limit: 8600   Drive Line: changed weekly by patients wife, [name], on Sundays  Labs:  LDH trend: 264>243>211  INR trend: 2.16>2.25>1.78>1.63  Anticoagulation Plan: -INR Goal: 2-2.5. Goal <1.5 for surgery. -ASA Dose: 81 mg  Blood Products:  none  Device: none  Renal:  -BUN/CRT: 17/1.08, 11/0.82  Plan/Recommendations:   1. Awaiting INR to drift to 1.5 for cholecystectomy, likely tomorrow pending INR. VAD Coordinator will accompany pt to the OR. 2. Page VAD Coordinator for any equipment issues.  Balinda Quails RN, VAD Coordinator 24/7 pager [phone]

## 2017-02-20 NOTE — Progress Notes (Signed)
Notified Scott [name] with heart failure team that order written for heparin gtt to be turned off at 0500 on 8/23 for pt to go to OR

## 2017-02-20 NOTE — Progress Notes (Signed)
   Subjective/Chief Complaint: No complaints, inr 1.65   Objective: Vital signs in last 24 hours: Temp:  [97.9 F (36.6 C)-99.6 F (37.6 C)] 99 F (37.2 C) (08/22 0324) Pulse Rate:  [82-102] 82 (08/22 0324) Resp:  [14-28] 14 (08/22 0324) BP: (65-117)/(46-78) 83/70 (08/22 0324) SpO2:  [93 %-100 %] 97 % (08/22 0324) Weight:  [83.6 kg (184 lb 3.2 oz)] 83.6 kg (184 lb 3.2 oz) (08/22 0500) Last BM Date: 02/15/17  Intake/Output from previous day: 08/21 0701 - 08/22 0700 In: 1729.9 [P.O.:360; I.V.:999.9; Blood:270; IV Piggyback:100] Out: 0263 [Urine:1025] Intake/Output this shift: No intake/output data recorded.  GI: soft nt  Lab Results:   Recent Labs  02/19/17 0459 02/20/17 0443  WBC 16.1* 12.7*  HGB 11.8* 11.0*  HCT 36.3* 34.5*  PLT 358 348   BMET  Recent Labs  02/19/17 0459 02/20/17 0443  NA 135 135  K 3.5 3.3*  CL 105 105  CO2 23 24  GLUCOSE 97 106*  BUN 15 11  CREATININE 0.99 0.82  CALCIUM 8.4* 8.3*   PT/INR  Recent Labs  02/19/17 1827 02/20/17 0443  LABPROT 20.9* 19.5*  INR 1.78 1.63    Anti-infectives: Anti-infectives    Start     Dose/Rate Route Frequency Ordered Stop   02/16/17 2200  piperacillin-tazobactam (ZOSYN) IVPB 3.375 g     3.375 g 12.5 mL/hr over 240 Minutes Intravenous Every 8 hours 02/16/17 2147     02/16/17 0945  piperacillin-tazobactam (ZOSYN) IVPB 3.375 g     3.375 g 100 mL/hr over 30 Minutes Intravenous  Once 02/16/17 7858 02/16/17 1147      Assessment/Plan: Resolving gsp and cholecystitis  inr 1.65 today, this is high risk case already and will wait until below 1.5.  Can have clears today, npo after mn, recheck inr in am, will schedule for tomorrow and stop heparin appropriately. I think with inr curve he should be ready by tomorrow  Rolm Bookbinder 02/20/2017

## 2017-02-20 NOTE — Progress Notes (Signed)
INR 1.63, Dr. Donne Hazel notified.  Surgery cancelled for today, ok to resume heparin at pharmacy recommended dose, Dr. Donne Hazel in to see pt.  Pt agreeable to plan.  Will continue to monitor.

## 2017-02-20 NOTE — Progress Notes (Addendum)
[place] for Heparin now that INR < 2. Indication: LVAD  No Known Allergies  Patient Measurements: Height: 5\' 10"  (177.8 cm) Weight: 185 lb 3 oz (84 kg) IBW/kg (Calculated) : 73 Heparin Dosing Weight: 84 kg   Vital Signs: Temp: 99 F (37.2 C) (08/22 0324) Temp Source: Oral (08/22 0324) BP: 83/70 (08/22 0324) Pulse Rate: 82 (08/22 0324)  Labs:  Recent Labs  02/17/17 0730  02/18/17 0212 02/19/17 0459 02/19/17 1827 02/20/17 0443  HGB 12.2*  --  12.1* 11.8*  --  11.0*  HCT 37.2*  --  38.6* 36.3*  --  34.5*  PLT 273  --  327 358  --  348  LABPROT 25.5*  < > 24.4* 25.3* 20.9*  --   INR 2.28  < > 2.16 2.25 1.78  --   HEPARINUNFRC  --   --   --   --   --  0.15*  CREATININE 0.92  --  1.08 0.99  --   --   < > = values in this interval not displayed.  Estimated Creatinine Clearance: 72.7 mL/min (by C-G formula based on SCr of 0.99 mg/dL).   Medical History: Past Medical History:  Diagnosis Date  . Acute on chronic respiratory failure ([place])    a. 08/2014 in setting of PE.  Marland Kitchen Ankylosing spondylitis ([place])   . Bilateral pulmonary embolism ([place])    a. 08/2014 - started on Coumadin. Retrievable IVC filter placed 08/27/14 due to RV strain and large clot burden.  Marland Kitchen CAD (coronary artery disease)    a. stenting of LCx 2013; b. STEMI 06/12/14 s/p PCI to LAD complicated by post cath shock requiring IABP; VT s/p DCCV, EF 20%; c. NSTEMI 06/26/14 treated medically.  . Carotid artery disease ([place])    a. s/p stenting.  . Chronic systolic CHF (congestive heart failure) ([place])   . Diabetes ([place])   . Hemoptysis    a. 08/2014 possibly due to pulm infarct/PE.  Marland Kitchen Hyperlipidemia   . Hypertension   . Hypotension   . Ischemic cardiomyopathy    a. echo 08/23/2014 EF <20%, dilated CM, mod MR/TR  . Leukocytosis   . Leukocytosis 09/10/2014  . Nephrolithiasis   . Pleural effusion on right 08/2014 - small  . Reactive thrombocytosis 09/10/2014  . Right leg DVT  ([place])    a. 08/2014.  Marland Kitchen Ventricular tachycardia ([place])    a. 06/2014 at time of MI, s/p DCCV.    Medications:  Infusions:  . dextrose 5 % and 0.45% NaCl 50 mL/hr at 02/19/17 2000  . heparin 1,100 Units/hr (02/20/17 0324)  . piperacillin-tazobactam (ZOSYN)  IV 3.375 g (02/19/17 2135)    Assessment: [name] admitted with gallstone pancreatitis. Coumadin placed on hold while awaiting possible surgery Wednesday when INR < 1.5.  INR 1.63 today s/p 2.5 mg PO vitamin K and 1 unit FFP on 8/21. Pharmacy asked to begin IV heparin once INR < 2.   Heparin level is subtherapeutic at 0.15. No issues with heparin infusion overnight per RN. CBC stable and no over s/s bleeding noted.   Goal of Therapy:  Heparin level 0.3-0.5 INR 2-2.5 Monitor platelets by anticoagulation protocol: Yes   Plan:  Increase heparin gtt to 1300 units/hr Check 6 hour heparin level Daily heparin level and CBC Monitor for s/s bleeding  F/u surgical plans   Argie Ramming, PharmD Clinical Pharmacist 02/20/17 5:43 AM

## 2017-02-21 ENCOUNTER — Inpatient Hospital Stay (HOSPITAL_COMMUNITY): Payer: Medicare Other | Admitting: Certified Registered"

## 2017-02-21 ENCOUNTER — Encounter (HOSPITAL_COMMUNITY)
Admission: EM | Disposition: A | Discharge status: Home / Self Care | Payer: Self-pay | Source: Home / Self Care | Attending: Internal Medicine

## 2017-02-21 ENCOUNTER — Encounter (HOSPITAL_COMMUNITY): Payer: Self-pay | Admitting: *Deleted

## 2017-02-21 ENCOUNTER — Inpatient Hospital Stay (HOSPITAL_COMMUNITY): Payer: Medicare Other

## 2017-02-21 HISTORY — PX: CHOLECYSTECTOMY: SHX55

## 2017-02-21 LAB — CBC
HCT: 34.2 % — ABNORMAL LOW (ref 39.0–52.0)
HCT: 32.8 % — ABNORMAL LOW (ref 39.0–52.0)
HEMOGLOBIN: 10.2 g/dL — AB (ref 13.0–17.0)
Hemoglobin: 10.8 g/dL — ABNORMAL LOW (ref 13.0–17.0)
MCH: 28.1 pg (ref 26.0–34.0)
MCH: 27.9 pg (ref 26.0–34.0)
MCHC: 31.6 g/dL (ref 30.0–36.0)
MCHC: 31.1 g/dL (ref 30.0–36.0)
MCV: 89.1 fL (ref 78.0–100.0)
MCV: 89.9 fL (ref 78.0–100.0)
PLATELETS: 414 10*3/uL — AB (ref 150–400)
PLATELETS: 343 10*3/uL (ref 150–400)
RBC: 3.84 MIL/uL — AB (ref 4.22–5.81)
RBC: 3.65 MIL/uL — ABNORMAL LOW (ref 4.22–5.81)
RDW: 15.6 % — AB (ref 11.5–15.5)
RDW: 15.5 % (ref 11.5–15.5)
WBC: 11.9 10*3/uL — AB (ref 4.0–10.5)
WBC: 17 10*3/uL — ABNORMAL HIGH (ref 4.0–10.5)

## 2017-02-21 LAB — POCT I-STAT 4, (NA,K, GLUC, HGB,HCT)
Glucose, Bld: 136 mg/dL — ABNORMAL HIGH (ref 65–99)
HEMATOCRIT: 32 % — AB (ref 39.0–52.0)
Hemoglobin: 10.9 g/dL — ABNORMAL LOW (ref 13.0–17.0)
POTASSIUM: 3.8 mmol/L (ref 3.5–5.1)
SODIUM: 139 mmol/L (ref 135–145)

## 2017-02-21 LAB — COMPREHENSIVE METABOLIC PANEL
ALT: 76 U/L — AB (ref 17–63)
AST: 45 U/L — ABNORMAL HIGH (ref 15–41)
Albumin: 2.7 g/dL — ABNORMAL LOW (ref 3.5–5.0)
Alkaline Phosphatase: 188 U/L — ABNORMAL HIGH (ref 38–126)
Anion gap: 7 (ref 5–15)
BUN: 9 mg/dL (ref 6–20)
CHLORIDE: 105 mmol/L (ref 101–111)
CO2: 23 mmol/L (ref 22–32)
CREATININE: 0.86 mg/dL (ref 0.61–1.24)
Calcium: 8.4 mg/dL — ABNORMAL LOW (ref 8.9–10.3)
GFR calc non Af Amer: >60 mL/min (ref >60)
Glucose, Bld: 104 mg/dL — ABNORMAL HIGH (ref 65–99)
Potassium: 3.7 mmol/L (ref 3.5–5.1)
SODIUM: 135 mmol/L (ref 135–145)
Total Bilirubin: 1 mg/dL (ref 0.3–1.2)
Total Protein: 6.5 g/dL (ref 6.5–8.1)

## 2017-02-21 LAB — PROTIME-INR
INR: 1.47
PROTHROMBIN TIME: 17.9 s — AB (ref 11.4–15.2)

## 2017-02-21 LAB — LACTATE DEHYDROGENASE: LDH: 214 U/L — ABNORMAL HIGH (ref 98–192)

## 2017-02-21 LAB — HEPARIN LEVEL (UNFRACTIONATED)
HEPARIN UNFRACTIONATED: 0.4 [IU]/mL (ref 0.30–0.70)
Heparin Unfractionated: 0.15 IU/mL — ABNORMAL LOW (ref 0.30–0.70)

## 2017-02-21 SURGERY — LAPAROSCOPIC CHOLECYSTECTOMY
Anesthesia: General | Site: Abdomen

## 2017-02-21 MED ORDER — FENTANYL CITRATE (PF) 250 MCG/5ML IJ SOLN
INTRAMUSCULAR | Status: AC
  Filled 2017-02-21: qty 5

## 2017-02-21 MED ORDER — SUGAMMADEX SODIUM 200 MG/2ML IV SOLN
INTRAVENOUS | Status: DC | PRN
  Administered 2017-02-21: 170 mg via INTRAVENOUS

## 2017-02-21 MED ORDER — ETOMIDATE 2 MG/ML IV SOLN
INTRAVENOUS | Status: DC | PRN
  Administered 2017-02-21: 20 mg via INTRAVENOUS

## 2017-02-21 MED ORDER — FENTANYL CITRATE (PF) 100 MCG/2ML IJ SOLN
50.0000 ug | INTRAMUSCULAR | Status: DC | PRN
  Administered 2017-02-21 – 2017-02-22 (×7): 50 ug via INTRAVENOUS
  Filled 2017-02-21 (×7): qty 2

## 2017-02-21 MED ORDER — HEMOSTATIC AGENTS (NO CHARGE) OPTIME
TOPICAL | Status: DC | PRN
  Administered 2017-02-21 (×4): 1 via TOPICAL

## 2017-02-21 MED ORDER — ROCURONIUM BROMIDE 100 MG/10ML IV SOLN
INTRAVENOUS | Status: DC | PRN
  Administered 2017-02-21 (×2): 50 mg via INTRAVENOUS

## 2017-02-21 MED ORDER — OXYCODONE HCL 5 MG/5ML PO SOLN: 5.0000 mg | Freq: Once | ORAL | Status: DC | PRN

## 2017-02-21 MED ORDER — LACTATED RINGERS IV SOLN
INTRAVENOUS | Status: DC
  Administered 2017-02-21 (×2): via INTRAVENOUS

## 2017-02-21 MED ORDER — OXYCODONE HCL 5 MG PO TABS
5.0000 mg | ORAL_TABLET | ORAL | Status: DC | PRN
Start: 2017-02-21 — End: 2017-02-23
  Administered 2017-02-21 – 2017-02-23 (×6): 5 mg via ORAL
  Filled 2017-02-21 (×6): qty 1

## 2017-02-21 MED ORDER — LIDOCAINE 2% (20 MG/ML) 5 ML SYRINGE
INTRAMUSCULAR | Status: AC
  Filled 2017-02-21: qty 5

## 2017-02-21 MED ORDER — PROPOFOL 10 MG/ML IV BOLUS
INTRAVENOUS | Status: AC
  Filled 2017-02-21: qty 20

## 2017-02-21 MED ORDER — CALCIUM CHLORIDE 10 % IV SOLN
INTRAVENOUS | Status: DC | PRN
  Administered 2017-02-21 (×2): 100 mg via INTRAVENOUS

## 2017-02-21 MED ORDER — LIDOCAINE HCL (CARDIAC) 20 MG/ML IV SOLN
INTRAVENOUS | Status: DC | PRN
  Administered 2017-02-21: 60 mg via INTRATRACHEAL

## 2017-02-21 MED ORDER — ONDANSETRON HCL 4 MG/2ML IJ SOLN
INTRAMUSCULAR | Status: DC | PRN
  Administered 2017-02-21: 4 mg via INTRAVENOUS

## 2017-02-21 MED ORDER — FENTANYL CITRATE (PF) 100 MCG/2ML IJ SOLN
INTRAMUSCULAR | Status: AC
  Administered 2017-02-21: 25 ug via INTRAVENOUS
  Filled 2017-02-21: qty 2

## 2017-02-21 MED ORDER — PHENYLEPHRINE HCL 10 MG/ML IJ SOLN
INTRAMUSCULAR | Status: DC | PRN
  Administered 2017-02-21: 30 ug/min via INTRAVENOUS

## 2017-02-21 MED ORDER — ROCURONIUM BROMIDE 10 MG/ML (PF) SYRINGE
PREFILLED_SYRINGE | INTRAVENOUS | Status: AC
  Filled 2017-02-21: qty 5

## 2017-02-21 MED ORDER — IOPAMIDOL (ISOVUE-300) INJECTION 61%
INTRAVENOUS | Status: AC
  Filled 2017-02-21: qty 50

## 2017-02-21 MED ORDER — MORPHINE SULFATE (PF) 4 MG/ML IV SOLN
2.0000 mg | INTRAVENOUS | Status: DC | PRN
  Administered 2017-02-21: 2 mg via INTRAVENOUS
  Filled 2017-02-21: qty 1

## 2017-02-21 MED ORDER — BUPIVACAINE-EPINEPHRINE (PF) 0.25% -1:200000 IJ SOLN
INTRAMUSCULAR | Status: AC
  Filled 2017-02-21: qty 30

## 2017-02-21 MED ORDER — ONDANSETRON HCL 4 MG/2ML IJ SOLN: 4.0000 mg | Freq: Four times a day (QID) | INTRAMUSCULAR | Status: DC | PRN

## 2017-02-21 MED ORDER — 0.9 % SODIUM CHLORIDE (POUR BTL) OPTIME
TOPICAL | Status: DC | PRN
  Administered 2017-02-21: 1000 mL

## 2017-02-21 MED ORDER — MIDAZOLAM HCL 2 MG/2ML IJ SOLN
1.0000 mg | Freq: Once | INTRAMUSCULAR | Status: AC
  Administered 2017-02-21: 1 mg via INTRAVENOUS

## 2017-02-21 MED ORDER — FENTANYL CITRATE (PF) 250 MCG/5ML IJ SOLN
INTRAMUSCULAR | Status: DC | PRN
  Administered 2017-02-21 (×3): 50 ug via INTRAVENOUS

## 2017-02-21 MED ORDER — OXYCODONE HCL 5 MG PO TABS: 5.0000 mg | ORAL_TABLET | Freq: Once | ORAL | Status: DC | PRN

## 2017-02-21 MED ORDER — FENTANYL CITRATE (PF) 100 MCG/2ML IJ SOLN
50.0000 ug | Freq: Once | INTRAMUSCULAR | Status: AC
  Administered 2017-02-21: 50 ug via INTRAVENOUS

## 2017-02-21 MED ORDER — FENTANYL CITRATE (PF) 100 MCG/2ML IJ SOLN
INTRAMUSCULAR | Status: AC
  Administered 2017-02-21: 50 ug via INTRAVENOUS
  Filled 2017-02-21: qty 2

## 2017-02-21 MED ORDER — SODIUM CHLORIDE 0.9 % IR SOLN
Status: DC | PRN
  Administered 2017-02-21: 1000 mL

## 2017-02-21 MED ORDER — FENTANYL CITRATE (PF) 100 MCG/2ML IJ SOLN
25.0000 ug | INTRAMUSCULAR | Status: DC | PRN
  Administered 2017-02-21: 50 ug via INTRAVENOUS
  Administered 2017-02-21 (×2): 25 ug via INTRAVENOUS

## 2017-02-21 MED ORDER — ALBUMIN HUMAN 5 % IV SOLN
INTRAVENOUS | Status: DC | PRN
  Administered 2017-02-21: 11:00:00 via INTRAVENOUS

## 2017-02-21 MED ORDER — MIDAZOLAM HCL 2 MG/2ML IJ SOLN
INTRAMUSCULAR | Status: AC
  Filled 2017-02-21: qty 2

## 2017-02-21 MED ORDER — MIDAZOLAM HCL 2 MG/2ML IJ SOLN
INTRAMUSCULAR | Status: AC
  Administered 2017-02-21: 1 mg via INTRAVENOUS
  Filled 2017-02-21: qty 2

## 2017-02-21 SURGICAL SUPPLY — 46 items
APPLIER CLIP 5 13 M/L LIGAMAX5 (MISCELLANEOUS) ×4
BLADE CLIPPER SURG (BLADE) IMPLANT
CANISTER SUCT 3000ML PPV (MISCELLANEOUS) ×4 IMPLANT
CHLORAPREP W/TINT 26ML (MISCELLANEOUS) ×4 IMPLANT
CLIP APPLIE 5 13 M/L LIGAMAX5 (MISCELLANEOUS) ×2 IMPLANT
CLOSURE WOUND 1/2 X4 (GAUZE/BANDAGES/DRESSINGS) ×1
COVER MAYO STAND STRL (DRAPES) ×4 IMPLANT
COVER SURGICAL LIGHT HANDLE (MISCELLANEOUS) ×4 IMPLANT
CUTTER FLEX LINEAR 45M (STAPLE) ×4 IMPLANT
DERMABOND ADVANCED (GAUZE/BANDAGES/DRESSINGS) ×2
DERMABOND ADVANCED .7 DNX12 (GAUZE/BANDAGES/DRESSINGS) ×2 IMPLANT
DEVICE TROCAR PUNCTURE CLOSURE (ENDOMECHANICALS) ×4 IMPLANT
DRAPE C-ARM 42X72 X-RAY (DRAPES) ×4 IMPLANT
ELECT CAUTERY BLADE 6.4 (BLADE) ×4 IMPLANT
ELECT REM PT RETURN 9FT ADLT (ELECTROSURGICAL) ×4
ELECTRODE REM PT RTRN 9FT ADLT (ELECTROSURGICAL) ×2 IMPLANT
GLOVE BIO SURGEON STRL SZ7 (GLOVE) ×4 IMPLANT
GLOVE BIOGEL PI IND STRL 7.5 (GLOVE) ×2 IMPLANT
GLOVE BIOGEL PI INDICATOR 7.5 (GLOVE) ×2
GOWN STRL REUS W/ TWL LRG LVL3 (GOWN DISPOSABLE) ×6 IMPLANT
GOWN STRL REUS W/TWL LRG LVL3 (GOWN DISPOSABLE) ×6
HEMOSTAT SNOW SURGICEL 2X4 (HEMOSTASIS) ×16 IMPLANT
KIT BASIN OR (CUSTOM PROCEDURE TRAY) ×4 IMPLANT
KIT ROOM TURNOVER OR (KITS) ×4 IMPLANT
NS IRRIG 1000ML POUR BTL (IV SOLUTION) ×4 IMPLANT
PAD ARMBOARD 7.5X6 YLW CONV (MISCELLANEOUS) ×4 IMPLANT
PENCIL BUTTON HOLSTER BLD 10FT (ELECTRODE) ×4 IMPLANT
POUCH RETRIEVAL ECOSAC 10 (ENDOMECHANICALS) ×2 IMPLANT
POUCH RETRIEVAL ECOSAC 10MM (ENDOMECHANICALS) ×2
POUCH SPECIMEN RETRIEVAL 10MM (ENDOMECHANICALS) ×4 IMPLANT
RELOAD STAPLE TA45 3.5 REG BLU (ENDOMECHANICALS) ×4 IMPLANT
SCISSORS LAP 5X35 DISP (ENDOMECHANICALS) ×4 IMPLANT
SET CHOLANGIOGRAPH 5 50 .035 (SET/KITS/TRAYS/PACK) ×4 IMPLANT
SET IRRIG TUBING LAPAROSCOPIC (IRRIGATION / IRRIGATOR) ×4 IMPLANT
SLEEVE ENDOPATH XCEL 5M (ENDOMECHANICALS) ×8 IMPLANT
SPECIMEN JAR SMALL (MISCELLANEOUS) ×4 IMPLANT
STRIP CLOSURE SKIN 1/2X4 (GAUZE/BANDAGES/DRESSINGS) ×3 IMPLANT
SUT MNCRL AB 4-0 PS2 18 (SUTURE) ×8 IMPLANT
SUT VICRYL 0 UR6 27IN ABS (SUTURE) ×8 IMPLANT
TOWEL OR 17X24 6PK STRL BLUE (TOWEL DISPOSABLE) ×4 IMPLANT
TOWEL OR 17X26 10 PK STRL BLUE (TOWEL DISPOSABLE) ×4 IMPLANT
TRAY LAPAROSCOPIC MC (CUSTOM PROCEDURE TRAY) ×4 IMPLANT
TROCAR BLADELESS 12MM (ENDOMECHANICALS) ×4 IMPLANT
TROCAR XCEL BLUNT TIP 100MML (ENDOMECHANICALS) ×4 IMPLANT
TROCAR XCEL NON-BLD 5MMX100MML (ENDOMECHANICALS) ×8 IMPLANT
TUBING INSUFFLATION (TUBING) ×4 IMPLANT

## 2017-02-21 NOTE — Anesthesia Procedure Notes (Signed)
Procedure Name: Intubation Date/Time: 02/21/2017 10:20 AM Performed by: Lance Coon Pre-anesthesia Checklist: Patient identified, Emergency Drugs available, Suction available, Patient being monitored and Timeout performed Patient Re-evaluated:Patient Re-evaluated prior to induction Oxygen Delivery Method: Circle system utilized Preoxygenation: Pre-oxygenation with 100% oxygen Induction Type: IV induction Ventilation: Mask ventilation without difficulty Laryngoscope Size: Glidescope and 4 Grade View: Grade I Tube type: Oral Tube size: 7.5 mm Number of attempts: 1 Airway Equipment and Method: Stylet and Video-laryngoscopy Placement Confirmation: ETT inserted through vocal cords under direct vision,  positive ETCO2 and breath sounds checked- equal and bilateral Secured at: 21 cm Tube secured with: Tape Dental Injury: Teeth and Oropharynx as per pre-operative assessment

## 2017-02-21 NOTE — Progress Notes (Signed)
LVAD Coordinator Rounding Note:  Admitted 8/19 with acute gallstone pancreatitis. Scheduled for lap chole per Dr. Donne Hazel today.  Heartmate II LVAD implanted on 09/21/14 by PVT under Destination therapy criteria.  Vital signs: HR: 87 Doppler Pressure: 78 Automatic BP: 96/68 (75) O2 Sat: 97 RA Wt: 184>185>184>186lbs   LVAD interrogation reveals:   Speed: 9200 Flow: 4.9 Power: 5.5 PI: 4.5 Alarms: none Events:  none Fixed speed: 9200 Low speed limit: 8600   Drive Line: changed weekly by patients wife, [name], on Sundays  Labs:  LDH trend: 264>243>211>214  INR trend: 2.16>2.25>1.78>1.63>1.47  Anticoagulation Plan: -INR Goal: 2-2.5. Goal <1.5 for surgery. -ASA Dose: 81 mg  Blood Products:  none  Device: none  Renal:  -BUN/CRT: 17/1.08, 11/0.82  Plan/Recommendations:   1. Lap cholecystectomy today. VAD Coordinator will accompany pt to the OR. 2. Page VAD Coordinator for any equipment issues.   [name] RN, VAD Coordinator 24/7 pager [phone]

## 2017-02-21 NOTE — Op Note (Signed)
Preoperative diagnosis: Gallstone pancreatitis and cholecystitis Postoperative diagnosis: Same as above Procedure: Laparoscopic cholecystectomy Surgeon: Dr. Serita Grammes Asst.: Dr. Greer Pickerel Anesthesia: Gen. Estimated blood loss:150 mL Specimens gallbladder to pathology Complications: None Drains: None Sponge and needle count was correct 2 and an operation Disposition to recovery stable  Indications: This is a 71 year old male who has a ventricular assist device in place. He has had a recent episode of gallstone pancreatitis which is resolved. He also appears to have cholecystitis on a HIDA scan. We have been waiting for his INR to become less than 1.5. I discussed a laparoscopic cholecystectomy with him.  Procedure: After informed consent was obtained he was taken to the operating room. He was on antibiotics. Sequential compression devices were in place. His heparin had been stopped prior to surgery. His INR was less than 1.5. The ventricular system device coordinator was present with this the entire time. He was placed under general anesthesia without complication. He was then prepped and draped in the standard sterile surgical fashion. He received some volume and when his cardiac function was adequate we began the case. A surgical timeout was then performed.  I will infiltrated Marcaine below the umbilicus. I made a vertical incision and carried this to the fascia with cautery. I grasped the fascia with Kocher clamps. I entered the fascia sharply and the peritoneum bluntly. I placed a 0 Vicryl pursestring suture through the fascia. A Hassan trocar was placed and the abdomen was insufflated to 15 mmHg pressure. He tolerated this well. I then used fluoroscopy to identify the drive line from his of ventricular assist device. I then placed 3 further 5 mm trocars in the epigastrium and right side of the abdomen. His gallbladder was very scarred into his liver and had evidence of acute  cholecystitis. This was grasped and retracted cephalad the first time the very friable liver was injured and this was cauterized until this was not bleeding any more. Eventually I was able to retract the gallbladder cephalad. I ended up placing an additional 5 mm trocar on the right side to retract the stomach as I could not see down to the common bile duct and triangle area. I was able to with some difficulty dissect the triangle. His gallbladder essentially was fused to his common bile duct. I was able to identify cystic artery as well. Once I had obtained a critical view of safety I elected to divide the cystic duct are basically the base of the gallbladder right near the common bile duct with the GIA stapler. I sized the epigastric trocar to a 12 mm trocar. I then inserted a GIA stapler and divided this. This was hemostatic. I then used clips to secure the artery and divided that as well. The gallbladder was fused to the liver and with some difficulty I was able to remove the gallbladder from the liver. His liver was friable and I had to cauterize this and number of times. I did enter into the gallbladder and spilled some stones. These were all evacuated at the completion. Eventually the gallbladder was removed from the liver bed and placed in an ecosac and removed it. I cauterized the liver bed. I placed several pieces of surgicel snow in the liver bed as well and this all appeared hemostatic upon completion. I will ask cardiology if we can hold his heparin for 24 hours. I then removed the [facility] trocar. I tied down my pursestring and placed 2 additional 0 Vicryl sutures and obliterating this  defect. Also close the epigastric defect with 0 Vicryl sutures. The remaining trocars were removed. These were all closed with 4-0 Monocryl and glue. He tolerated this well and will be transferred to the ICU in stable condition.

## 2017-02-21 NOTE — Transfer of Care (Signed)
Immediate Anesthesia Transfer of Care Note  Patient: Scott [name]  Procedure(s) Performed: Procedure(s): LAPAROSCOPIC CHOLECYSTECTOMY (N/A)  Patient Location: PACU  Anesthesia Type:General  Level of Consciousness: awake and patient cooperative  Airway & Oxygen Therapy: Patient Spontanous Breathing  Post-op Assessment: Report given to RN and Post -op Vital signs reviewed and stable  Post vital signs: Reviewed and stable  Last Vitals:  Vitals:   02/21/17 0930 02/21/17 1229  BP:    Pulse:    Resp: 19   Temp:  36.4 C  SpO2:      Last Pain:  Vitals:   02/21/17 0752  TempSrc: Oral  PainSc:       Patients Stated Pain Goal: 0 (16/10/96 0454)  Complications: No apparent anesthesia complications

## 2017-02-21 NOTE — Progress Notes (Signed)
   Subjective/Chief Complaint: No complaints, ready for surgery   Objective: Vital signs in last 24 hours: Temp:  [97.8 F (36.6 C)-98.9 F (37.2 C)] 98.3 F (36.8 C) (08/23 0500) Pulse Rate:  [80-88] 80 (08/22 2338) Resp:  [20-25] 23 (08/23 0518) BP: (81-100)/(63-77) 83/69 (08/23 0518) SpO2:  [95 %-100 %] 95 % (08/23 0518) Weight:  [84.7 kg (186 lb 11.7 oz)] 84.7 kg (186 lb 11.7 oz) (08/23 0500) Last BM Date: 02/15/17  Intake/Output from previous day: 08/22 0701 - 08/23 0700 In: 2142.4 [P.O.:720; I.V.:1222.4; IV Piggyback:200] Out: 1400 [Urine:1400] Intake/Output this shift: No intake/output data recorded.  GI: soft nt  Lab Results:   Recent Labs  02/20/17 0443 02/21/17 0541  WBC 12.7* 11.9*  HGB 11.0* 10.8*  HCT 34.5* 34.2*  PLT 348 414*   BMET  Recent Labs  02/20/17 0443 02/21/17 0541  NA 135 135  K 3.3* 3.7  CL 105 105  CO2 24 23  GLUCOSE 106* 104*  BUN 11 9  CREATININE 0.82 0.86  CALCIUM 8.3* 8.4*   PT/INR  Recent Labs  02/20/17 0443 02/21/17 0541  LABPROT 19.5* 17.9*  INR 1.63 1.47   ABG No results for input(s): PHART, HCO3 in the last 72 hours.  Invalid input(s): PCO2, PO2  Studies/Results: No results found.  Anti-infectives: Anti-infectives    Start     Dose/Rate Route Frequency Ordered Stop   02/16/17 2200  piperacillin-tazobactam (ZOSYN) IVPB 3.375 g     3.375 g 12.5 mL/hr over 240 Minutes Intravenous Every 8 hours 02/16/17 2147     02/16/17 0945  piperacillin-tazobactam (ZOSYN) IVPB 3.375 g     3.375 g 100 mL/hr over 30 Minutes Intravenous  Once 02/16/17 0943 02/16/17 1147      Assessment/Plan: Resolved gsp/cholecystitis  inr < 1.5 today, will proceed with lap chole  [facility] 02/21/2017

## 2017-02-21 NOTE — Progress Notes (Signed)
[place] for Heparin while INR < 2. Indication: LVAD  No Known Allergies  Patient Measurements: Height: 5\' 10"  (177.8 cm) Weight: 184 lb 3.2 oz (83.6 kg) IBW/kg (Calculated) : 73 Heparin Dosing Weight: 84 kg   Vital Signs: Temp: 98.2 F (36.8 C) (08/22 2343) Temp Source: Oral (08/22 2343) BP: 100/63 (08/22 2338) Pulse Rate: 80 (08/22 2338)  Labs:  Recent Labs  02/18/17 0212 02/19/17 0459 02/19/17 1827 02/20/17 0443 02/20/17 1311 02/20/17 2353  HGB 12.1* 11.8*  --  11.0*  --   --   HCT 38.6* 36.3*  --  34.5*  --   --   PLT 327 358  --  348  --   --   LABPROT 24.4* 25.3* 20.9* 19.5*  --   --   INR 2.16 2.25 1.78 1.63  --   --   HEPARINUNFRC  --   --   --  0.15* 0.16* 0.40  CREATININE 1.08 0.99  --  0.82  --   --     Estimated Creatinine Clearance: 87.8 mL/min (by C-G formula based on SCr of 0.82 mg/dL).   Medical History: Past Medical History:  Diagnosis Date  . Acute on chronic respiratory failure ([place])    a. 08/2014 in setting of PE.  Marland Kitchen Ankylosing spondylitis ([place])   . Bilateral pulmonary embolism ([place])    a. 08/2014 - started on Coumadin. Retrievable IVC filter placed 08/27/14 due to RV strain and large clot burden.  Marland Kitchen CAD (coronary artery disease)    a. stenting of LCx 2013; b. STEMI 06/12/14 s/p PCI to LAD complicated by post cath shock requiring IABP; VT s/p DCCV, EF 20%; c. NSTEMI 06/26/14 treated medically.  . Carotid artery disease ([place])    a. s/p stenting.  . Chronic systolic CHF (congestive heart failure) ([place])   . Diabetes ([place])   . Hemoptysis    a. 08/2014 possibly due to pulm infarct/PE.  Marland Kitchen Hyperlipidemia   . Hypertension   . Hypotension   . Ischemic cardiomyopathy    a. echo 08/23/2014 EF <20%, dilated CM, mod MR/TR  . Leukocytosis   . Leukocytosis 09/10/2014  . Nephrolithiasis   . Pleural effusion on right 08/2014 - small  . Reactive thrombocytosis 09/10/2014  . Right leg DVT ([place])    a. 08/2014.  Marland Kitchen  Ventricular tachycardia ([place])    a. 06/2014 at time of MI, s/p DCCV.    Medications:  Infusions:  . dextrose 5 % and 0.45% NaCl 50 mL/hr at 02/20/17 1700  . heparin 1,500 Units/hr (02/20/17 1700)  . piperacillin-tazobactam (ZOSYN)  IV 3.375 g (02/20/17 2138)    Assessment: [name] admitted with gallstone pancreatitis. Coumadin placed on hold while awaiting possible surgery Wednesday when INR < 1.5.  INR 1.63 on 8/22 s/p 2.5 mg PO vitamin K and 1 unit FFP from 8/21. Pharmacy asked to begin IV heparin once INR < 2.   Heparin level is therapeutic at 0.40. CBC stable from 8/22 and no bleeding noted.   Goal of Therapy:  Heparin level 0.3-0.5 INR 2-2.5 Monitor platelets by anticoagulation protocol: Yes   Plan:  Continue heparin gtt at 1500 units/hr Check 6 hour heparin level Daily heparin level and CBC Monitor for s/s bleeding   Argie Ramming, PharmD Clinical Pharmacist 02/21/17 1:09 AM

## 2017-02-21 NOTE — Anesthesia Preprocedure Evaluation (Signed)
Anesthesia Evaluation  Patient identified by MRN, date of birth, ID band Patient awake    Reviewed: Allergy & Precautions, H&P , NPO status , Patient's Chart, lab work & pertinent test results  Airway Mallampati: III   Neck ROM: limited    Dental   Pulmonary former smoker,    breath sounds clear to auscultation       Cardiovascular hypertension, + CAD, + Past MI, + Peripheral Vascular Disease and +CHF  + dysrhythmias  Rhythm:regular Rate:Normal  LVAD placed 2016. EF 10%   Neuro/Psych    GI/Hepatic   Endo/Other  diabetes, Type 2  Renal/GU      Musculoskeletal Ankylosing spondylitis   Abdominal   Peds  Hematology   Anesthesia Other Findings   Reproductive/Obstetrics                             Anesthesia Physical Anesthesia Plan  ASA: IV  Anesthesia Plan: General   Post-op Pain Management:    Induction: Intravenous  PONV Risk Score and Plan: 3 and Ondansetron, Dexamethasone, Midazolam and Treatment may vary due to age or medical condition  Airway Management Planned: Oral ETT  Additional Equipment: Arterial line  Intra-op Plan:   Post-operative Plan: Extubation in OR  Informed Consent: I have reviewed the patients History and Physical, chart, labs and discussed the procedure including the risks, benefits and alternatives for the proposed anesthesia with the patient or authorized representative who has indicated his/her understanding and acceptance.     Plan Discussed with: CRNA, Anesthesiologist and Surgeon  Anesthesia Plan Comments:         Anesthesia Quick Evaluation

## 2017-02-22 ENCOUNTER — Encounter (HOSPITAL_COMMUNITY): Payer: Self-pay | Admitting: General Surgery

## 2017-02-22 LAB — COMPREHENSIVE METABOLIC PANEL
ALBUMIN: 2.5 g/dL — AB (ref 3.5–5.0)
ALK PHOS: 169 U/L — AB (ref 38–126)
ALT: 66 U/L — AB (ref 17–63)
AST: 54 U/L — ABNORMAL HIGH (ref 15–41)
Anion gap: 9 (ref 5–15)
BILIRUBIN TOTAL: 1.1 mg/dL (ref 0.3–1.2)
BUN: 7 mg/dL (ref 6–20)
CALCIUM: 8.5 mg/dL — AB (ref 8.9–10.3)
CO2: 23 mmol/L (ref 22–32)
CREATININE: 0.85 mg/dL (ref 0.61–1.24)
Chloride: 105 mmol/L (ref 101–111)
GFR calc Af Amer: >60 mL/min (ref >60)
GFR calc non Af Amer: >60 mL/min (ref >60)
GLUCOSE: 104 mg/dL — AB (ref 65–99)
Potassium: 3.9 mmol/L (ref 3.5–5.1)
SODIUM: 137 mmol/L (ref 135–145)
TOTAL PROTEIN: 6 g/dL — AB (ref 6.5–8.1)

## 2017-02-22 LAB — HEPARIN LEVEL (UNFRACTIONATED): Heparin Unfractionated: <0.1 IU/mL — ABNORMAL LOW (ref 0.30–0.70)

## 2017-02-22 LAB — CBC
HCT: 32.6 % — ABNORMAL LOW (ref 39.0–52.0)
HCT: 34.3 % — ABNORMAL LOW (ref 39.0–52.0)
Hemoglobin: 10.2 g/dL — ABNORMAL LOW (ref 13.0–17.0)
Hemoglobin: 10.8 g/dL — ABNORMAL LOW (ref 13.0–17.0)
MCH: 28.2 pg (ref 26.0–34.0)
MCH: 28.3 pg (ref 26.0–34.0)
MCHC: 31.3 g/dL (ref 30.0–36.0)
MCHC: 31.5 g/dL (ref 30.0–36.0)
MCV: 90.1 fL (ref 78.0–100.0)
MCV: 89.8 fL (ref 78.0–100.0)
PLATELETS: 362 10*3/uL (ref 150–400)
Platelets: 376 10*3/uL (ref 150–400)
RBC: 3.62 MIL/uL — AB (ref 4.22–5.81)
RBC: 3.82 MIL/uL — ABNORMAL LOW (ref 4.22–5.81)
RDW: 15.6 % — AB (ref 11.5–15.5)
RDW: 15.6 % — ABNORMAL HIGH (ref 11.5–15.5)
WBC: 13 10*3/uL — AB (ref 4.0–10.5)
WBC: 13.9 10*3/uL — ABNORMAL HIGH (ref 4.0–10.5)

## 2017-02-22 LAB — PROTIME-INR
INR: 1.41
Prothrombin Time: 17.4 seconds — ABNORMAL HIGH (ref 11.4–15.2)

## 2017-02-22 LAB — LACTATE DEHYDROGENASE: LDH: 245 U/L — ABNORMAL HIGH (ref 98–192)

## 2017-02-22 MED ORDER — WARFARIN SODIUM 7.5 MG PO TABS
7.5000 mg | ORAL_TABLET | Freq: Once | ORAL | Status: AC
  Administered 2017-02-22: 7.5 mg via ORAL
  Filled 2017-02-22: qty 1

## 2017-02-22 MED ORDER — ASPIRIN EC 81 MG PO TBEC
81.0000 mg | DELAYED_RELEASE_TABLET | Freq: Every day | ORAL | Status: DC
  Administered 2017-02-22 – 2017-02-25 (×4): 81 mg via ORAL
  Filled 2017-02-22 (×4): qty 1

## 2017-02-22 MED ORDER — HEPARIN (PORCINE) IN NACL 100-0.45 UNIT/ML-% IJ SOLN
1400.0000 [IU]/h | INTRAMUSCULAR | Status: DC
  Administered 2017-02-22: 1400 [IU]/h via INTRAVENOUS
  Filled 2017-02-22: qty 250

## 2017-02-22 MED ORDER — WARFARIN - PHARMACIST DOSING INPATIENT
Freq: Every day | Status: DC
  Administered 2017-02-22 – 2017-02-24 (×2)

## 2017-02-22 MED ORDER — HEPARIN (PORCINE) IN NACL 100-0.45 UNIT/ML-% IJ SOLN
1500.0000 [IU]/h | INTRAMUSCULAR | Status: DC
  Administered 2017-02-22 – 2017-02-24 (×4): 1500 [IU]/h via INTRAVENOUS
  Filled 2017-02-22 (×3): qty 250

## 2017-02-22 NOTE — Progress Notes (Signed)
ANTICOAGULATION CONSULT NOTE - Follow Up Consult  Pharmacy Consult for Heparin while INR < 2 Indication: LVAD  No Known Allergies  Patient Measurements: Height: 5\' 10"  (177.8 cm) Weight: 187 lb 13.3 oz (85.2 kg) (Per pt: weigh without controller ) IBW/kg (Calculated) : 73 Heparin Dosing Weight: 84 kg   Vital Signs: Temp: 98.8 F (37.1 C) (08/24 1929) Temp Source: Oral (08/24 1929) BP: 111/67 (08/24 1929) Pulse Rate: 107 (08/24 1929)  Labs:  Recent Labs  02/20/17 0443  02/21/17 0541  02/21/17 1852 02/22/17 0350 02/22/17 1806  HGB 11.0*  --  10.8*  < > 10.2* 10.2* 10.8*  HCT 34.5*  --  34.2*  < > 32.8* 32.6* 34.3*  PLT 348  --  414*  --  343 362 376  LABPROT 19.5*  --  17.9*  --   --  17.4*  --   INR 1.63  --  1.47  --   --  1.41  --   HEPARINUNFRC 0.15*  < > 0.15*  --   --  <0.10* <0.10*  CREATININE 0.82  --  0.86  --   --  0.85  --   < > = values in this interval not displayed.  Estimated Creatinine Clearance: 84.7 mL/min (by C-G formula based on SCr of 0.85 mg/dL).  Medications: Heparin @ 1400 units/hr  Assessment: 71yom with LVAD resumed on heparin today after lap chole for gallstone pancreatitis/cholecystitis. Initial heparin level is < 0.10. Spoke to RN who said his IV site was oozing around 1730 and heparin was stopped "for a few minutes" while another site was obtained. Will increase the rate conservatively.   Goal of Therapy:  Heparin level close to 0.3 Monitor platelets by anticoagulation protocol: Yes   Plan:  1) Increase heparin to 1500 units/hr 2) Check another heparin level in 8 hours  Nena Jordan, PharmD, BCPS  02/22/2017 7:33 PM

## 2017-02-22 NOTE — Anesthesia Postprocedure Evaluation (Signed)
Anesthesia Post Note  Patient: Scott [name]  Procedure(s) Performed: Procedure(s) (LRB): LAPAROSCOPIC CHOLECYSTECTOMY (N/A)     Patient location during evaluation: PACU Anesthesia Type: General Level of consciousness: awake and alert and patient cooperative Pain management: pain level controlled Vital Signs Assessment: post-procedure vital signs reviewed and stable Respiratory status: spontaneous breathing and respiratory function stable Cardiovascular status: stable Anesthetic complications: no    Last Vitals:  Vitals:   02/22/17 0700 02/22/17 0743  BP:    Pulse: 85   Resp: (!) 29   Temp:  (!) 36.4 C  SpO2: 94%     Last Pain:  Vitals:   02/22/17 0743  TempSrc: Oral  PainSc:                  [place] S

## 2017-02-22 NOTE — Progress Notes (Signed)
Advanced Heart Failure VAD Team Note  Subjective:    Admitted with acute gallstone pancreatitis.    U/S, CT and HIDA scan all reviewed personally. No evidence of CBD so likely passed stone though there is evidence of ongoing cystic duct obstruction. GSU has seen. Recommending continued bowel rest with possible cholecystectomy vs C-tube this week  S/P lap cholecystectomy 8/23  Mild discomfort. Denies SOB. INR 1.4.    LVAD INTERROGATION:  HeartMate II LVAD:  Flow  4.6liters/min, speed 9200, power 5.2  PI 5.2 No PI events over night.     Objective:    Vital Signs:   Temp:  [97.5 F (36.4 C)-98.7 F (37.1 C)] 97.5 F (36.4 C) (08/24 0743) Pulse Rate:  [76-100] 85 (08/24 0700) Resp:  [2-31] 29 (08/24 0700) BP: (76-83)/(59-68) 76/65 (08/23 1329) SpO2:  [86 %-100 %] 94 % (08/24 0700) Arterial Line BP: (68-89)/(63-76) 71/65 (08/24 0700) Weight:  [186 lb (84.4 kg)-187 lb 13.3 oz (85.2 kg)] 187 lb 13.3 oz (85.2 kg) (08/24 0600) Last BM Date:  (Prior to surgery) Mean arterial Pressure70s    Intake/Output:   Intake/Output Summary (Last 24 hours) at 02/22/17 0806 Last data filed at 02/22/17 0700  Gross per 24 hour  Intake             2920 ml  Output             1150 ml  Net             1770 ml     Physical Exam     Physical Exam: GENERAL: Well appearing, male. Sitting in the chair.  HEENT: normal  NECK: Supple, JVP 6-7 .  2+ bilaterally, no bruits.  No lymphadenopathy or thyromegaly appreciated.   CARDIAC:  Mechanical heart sounds with LVAD hum present.  LUNGS:  Clear to auscultation bilaterally.  ABDOMEN:  Soft, round, nontender, positive bowel sounds x4.     LVAD exit site: well-healed and incorporated.  Dressing dry and intact.  No erythema or drainage.  Stabilization device present and accurately applied.  Driveline dressing is being changed daily per sterile technique. EXTREMITIES:  Warm and dry, no cyanosis, clubbing, rash or edema  NEUROLOGIC:  Alert and oriented x  4.  Gait steady.  No aphasia.  No dysarthria.  Affect pleasant.       Telemetry   Personally reviewed NSR.   Labs   Basic Metabolic Panel:  Recent Labs Lab 02/18/17 0212 02/19/17 0459 02/20/17 0443 02/21/17 0541 02/21/17 1210 02/22/17 0350  NA 138 135 135 135 139 137  K 4.3 3.5 3.3* 3.7 3.8 3.9  CL 104 105 105 105  --  105  CO2 28 23 24 23   --  23  GLUCOSE 75 97 106* 104* 136* 104*  BUN 17 15 11 9   --  7  CREATININE 1.08 0.99 0.82 0.86  --  0.85  CALCIUM 8.9 8.4* 8.3* 8.4*  --  8.5*    Liver Function Tests:  Recent Labs Lab 02/18/17 0212 02/19/17 0459 02/20/17 0443 02/21/17 0541 02/22/17 0350  AST 112* 82* 60* 45* 54*  ALT 144* 120* 96* 76* 66*  ALKPHOS 240* 221* 185* 188* 169*  BILITOT 1.5* 1.3* 1.2 1.0 1.1  PROT 7.0 6.9 6.4* 6.5 6.0*  ALBUMIN 3.1* 2.8* 2.7* 2.7* 2.5*    Recent Labs Lab 02/16/17 0902 02/17/17 0730 02/18/17 0212 02/19/17 0459 02/20/17 0443  LIPASE 868* 109* 99* 71* 79*   No results for input(s): AMMONIA in the last  168 hours.  CBC:  Recent Labs Lab 02/19/17 0459 02/20/17 0443 02/21/17 0541 02/21/17 1210 02/21/17 1852 02/22/17 0350  WBC 16.1* 12.7* 11.9*  --  17.0* 13.0*  HGB 11.8* 11.0* 10.8* 10.9* 10.2* 10.2*  HCT 36.3* 34.5* 34.2* 32.0* 32.8* 32.6*  MCV 89.0 89.6 89.1  --  89.9 90.1  PLT 358 348 414*  --  343 362    INR:  Recent Labs Lab 02/19/17 0459 02/19/17 1827 02/20/17 0443 02/21/17 0541 02/22/17 0350  INR 2.25 1.78 1.63 1.47 1.41    Other results:     Imaging   Dg C-arm 1-60 Min-no Report  Result Date: 02/21/2017 Fluoroscopy was utilized by the requesting physician.  No radiographic interpretation.     Medications:     Scheduled Medications: . pantoprazole (PROTONIX) IV  40 mg Intravenous Q24H  . rosuvastatin  10 mg Oral QHS  . traZODone  100 mg Oral QHS    Infusions: . dextrose 5 % and 0.45% NaCl 50 mL/hr at 02/22/17 0700  . piperacillin-tazobactam (ZOSYN)  IV 3.375 g (02/22/17  0525)    PRN Medications: acetaminophen, clobetasol cream, cyclobenzaprine, fentaNYL (SUBLIMAZE) injection, ondansetron (ZOFRAN) IV, oxyCODONE, traMADol   Patient Profile   71 y/o male with severe iCM and systolic HF s/p HM-II LVAD (3/16) admitted 8/18 with acute gallstone pancreatitis.    Assessment/Plan:    1. Acute gallstone pancreatitis - Symptomatically improved. No tenderness on exam. LFTs and lipase continue to trend down.  - u/s, CT and HIDA scan all previously reviewed personally. No evidence of CBD so likely passed stone though there is evidence of ongoing cystic duct obstruction - S/P lap cholecystectomy. Remove A line  Restart heparin at 1200 today. Hgb stable. Discussed with pharm D.   Last dose of zosyn today.   2. Chronic systolic HF - s/p VAD 5/36 -Volume status stable. VAD parameters stable.   - Restart heparin drip later today. Coumadin to restart later tonight.   3. CAD - Stable. No S/S ischemia.    4. Ankylosing spondylitis - Stable. Continue pain meds prn.   I reviewed the LVAD parameters from today, and compared the results to the patient's prior recorded data.  No programming changes were made.  The LVAD is functioning within specified parameters.  The patient performs LVAD self-test daily.  LVAD interrogation was negative for any significant power changes, alarms or PI events/speed drops.  LVAD equipment check completed and is in good working order.  Back-up equipment present.   LVAD education done on emergency procedures and precautions and reviewed exit site care.   Transfer to SDU.   Length of Stay: [place], NP 02/22/2017, 8:06 AM  VAD Team --- VAD ISSUES ONLY--- Pager [phone] (7am - 7am)  Advanced Heart Failure Team  Pager [phone] (M-F; 7a - 4p)  Please contact [facility] for night-coverage after hours (4p -7a ) and weekends on [email or web address]  Patient seen and examined with Darrick Grinder, NP. We discussed all aspects of the encounter.  I agree with the assessment and plan as stated above.   Doing much better today. Less ab pain. Hungry and wants to eat. Good bowel sounds on exam. Volume status stable. VAD parameters ok.   Will advance diet slowly. Discussed with GSU and ok to start heparin later today.   [name], MD  6:14 PM

## 2017-02-22 NOTE — Progress Notes (Signed)
LVAD Coordinator Rounding Note:  Admitted 8/19 with acute gallstone pancreatitis.  Heartmate II LVAD implanted on 09/21/14 by PVT under Destination therapy criteria.  S/p Lap chole yesterday. Pt is doing well. Sitting up in the chair.   Vital signs: HR: 88 Doppler Pressure: not done Automatic BP: 100/81 (89) O2 Sat: 94 RA Wt: 184>185>184>186>187lbs   LVAD interrogation reveals:   Speed: 9200 Flow: 4.6 Power: 5.4 PI: 5.9 Alarms: none Events:  none Fixed speed: 9200 Low speed limit: 8600   Drive Line: changed weekly by patients wife, [name], on Sundays  Labs:  LDH trend: 264>243>211>214>245  INR trend: 2.16>2.25>1.78>1.63>1.47>1.41  Anticoagulation Plan: -INR Goal: 2-2.5. Restarting tonight -ASA Dose: 81 mg restarting today  Blood Products:  none  Device: none  Renal:  -BUN/CRT: 17/1.08, 11/0.82   Plan/Recommendations:   1. Will restart Heparin at noon today. 2. Pt will transfer to stepdown today.  3. Page VAD Coordinator for any equipment issues.   [name] RN, VAD Coordinator 24/7 pager [phone]

## 2017-02-22 NOTE — Care Management Note (Signed)
Case Management Note  Patient Details  Name: Scott [name] MRN: [id number] Date of Birth: 05/30/1946  Subjective/Objective:  LVAD pt  Admitted with acute gallstone, pancreatitis , s/p lap chole, pta indep, from home with wife.                    Action/Plan: NCM will follow for dc needs.   Expected Discharge Date:                  Expected Discharge Plan:  [place]  In-House Referral:     Discharge planning Services  CM Consult  Post Acute Care Choice:    Choice offered to:     DME Arranged:    DME Agency:     HH Arranged:    [place] Agency:     Status of Service:  In process, will continue to follow  If discussed at Long Length of Stay Meetings, dates discussed:    Additional Comments:  Zenon Mayo, RN 02/22/2017, 11:07 AM

## 2017-02-22 NOTE — Progress Notes (Signed)
[place] for Heparin while INR < 2; Restart Coumadin Indication: LVAD  No Known Allergies  Patient Measurements: Height: 5\' 10"  (177.8 cm) Weight: 187 lb 13.3 oz (85.2 kg) (Per pt: weigh without controller ) IBW/kg (Calculated) : 73 Heparin Dosing Weight: 84 kg   Vital Signs: Temp: 97.5 F (36.4 C) (08/24 0743) Temp Source: Oral (08/24 0743) Pulse Rate: 85 (08/24 0700)  Labs:  Recent Labs  02/20/17 0443  02/20/17 2353 02/21/17 0541 02/21/17 1210 02/21/17 1852 02/22/17 0350  HGB 11.0*  --   --  10.8* 10.9* 10.2* 10.2*  HCT 34.5*  --   --  34.2* 32.0* 32.8* 32.6*  PLT 348  --   --  414*  --  343 362  LABPROT 19.5*  --   --  17.9*  --   --  17.4*  INR 1.63  --   --  1.47  --   --  1.41  HEPARINUNFRC 0.15*  < > 0.40 0.15*  --   --  <0.10*  CREATININE 0.82  --   --  0.86  --   --  0.85  < > = values in this interval not displayed.  Estimated Creatinine Clearance: 84.7 mL/min (by C-G formula based on SCr of 0.85 mg/dL).   Medical History: Past Medical History:  Diagnosis Date  . Acute on chronic respiratory failure ([place])    a. 08/2014 in setting of PE.  Marland Kitchen Ankylosing spondylitis ([place])   . Bilateral pulmonary embolism ([place])    a. 08/2014 - started on Coumadin. Retrievable IVC filter placed 08/27/14 due to RV strain and large clot burden.  Marland Kitchen CAD (coronary artery disease)    a. stenting of LCx 2013; b. STEMI 06/12/14 s/p PCI to LAD complicated by post cath shock requiring IABP; VT s/p DCCV, EF 20%; c. NSTEMI 06/26/14 treated medically.  . Carotid artery disease ([place])    a. s/p stenting.  . Chronic systolic CHF (congestive heart failure) ([place])   . Diabetes ([place])   . Hemoptysis    a. 08/2014 possibly due to pulm infarct/PE.  Marland Kitchen Hyperlipidemia   . Hypertension   . Hypotension   . Ischemic cardiomyopathy    a. echo 08/23/2014 EF <20%, dilated CM, mod MR/TR  . Leukocytosis   . Leukocytosis 09/10/2014  . Nephrolithiasis   . Pleural  effusion on right 08/2014 - small  . Reactive thrombocytosis 09/10/2014  . Right leg DVT ([place])    a. 08/2014.  Marland Kitchen Ventricular tachycardia ([place])    a. 06/2014 at time of MI, s/p DCCV.    Medications:  Infusions:  . dextrose 5 % and 0.45% NaCl 50 mL/hr at 02/22/17 0700  . heparin    . piperacillin-tazobactam (ZOSYN)  IV 3.375 g (02/22/17 0525)    Assessment: [name] admitted with gallstone pancreatitis. Coumadin placed on hold while awaiting surgery.  S/p OR Thurs 8/23  Per Dr. Donne Hazel and Dr [name], okay to resume IV heparin today at noon.  Will also resume Coumadin this evening.  Spoke with Dr. [name], will aim to keep heparin level low ~ 0.3.  INR remains subtherapeutic at 1.41, did receive 2.5 mg vit K on 8/21. CBC stable and no overt s/s bleeding noted.   Goal of Therapy:  Heparin level close to 0.3 INR 2-2.5 Monitor platelets by anticoagulation protocol: Yes   Plan:  Restart heparin gtt at 1400 units/hr at noon today. Check 6  hour heparin level Daily heparin level and CBC Coumadin 7.5 mg x 1 tonight. Monitor for s/s bleeding    Uvaldo Rising, BCPS  Clinical Pharmacist Pager [phone]  02/22/2017 8:38 AM

## 2017-02-22 NOTE — Progress Notes (Signed)
Transferred -in from [place] by wheelchair  Awake and alert.

## 2017-02-22 NOTE — Progress Notes (Signed)
1 Day Post-Op   Subjective/Chief Complaint: Feels well this am, no n/v, sitting in chair   Objective: Vital signs in last 24 hours: Temp:  [97.6 F (36.4 C)-98.7 F (37.1 C)] 98.7 F (37.1 C) (08/24 0400) Pulse Rate:  [76-100] 85 (08/24 0700) Resp:  [2-31] 29 (08/24 0700) BP: (76-96)/(59-68) 76/65 (08/23 1329) SpO2:  [86 %-100 %] 94 % (08/24 0700) Arterial Line BP: (68-89)/(63-76) 71/65 (08/24 0700) Weight:  [84.4 kg (186 lb)-85.2 kg (187 lb 13.3 oz)] 85.2 kg (187 lb 13.3 oz) (08/24 0600) Last BM Date:  (Prior to surgery)  Intake/Output from previous day: 08/23 0701 - 08/24 0700 In: 2920 [P.O.:170; I.V.:2350; IV Piggyback:400] Out: 1150 [Urine:1000; Blood:150] Intake/Output this shift: No intake/output data recorded.  GI: approp tender, soft, some bs, incisions clean  Lab Results:   Recent Labs  02/21/17 1852 02/22/17 0350  WBC 17.0* 13.0*  HGB 10.2* 10.2*  HCT 32.8* 32.6*  PLT 343 362   BMET  Recent Labs  02/21/17 0541 02/21/17 1210 02/22/17 0350  NA 135 139 137  K 3.7 3.8 3.9  CL 105  --  105  CO2 23  --  23  GLUCOSE 104* 136* 104*  BUN 9  --  7  CREATININE 0.86  --  0.85  CALCIUM 8.4*  --  8.5*   PT/INR  Recent Labs  02/21/17 0541 02/22/17 0350  LABPROT 17.9* 17.4*  INR 1.47 1.41   ABG No results for input(s): PHART, HCO3 in the last 72 hours.  Invalid input(s): PCO2, PO2  Studies/Results: Dg C-arm 1-60 Min-no Report  Result Date: 02/21/2017 Fluoroscopy was utilized by the requesting physician.  No radiographic interpretation.    Anti-infectives: Anti-infectives    Start     Dose/Rate Route Frequency Ordered Stop   02/16/17 2200  piperacillin-tazobactam (ZOSYN) IVPB 3.375 g     3.375 g 12.5 mL/hr over 240 Minutes Intravenous Every 8 hours 02/16/17 2147     02/16/17 0945  piperacillin-tazobactam (ZOSYN) IVPB 3.375 g     3.375 g 100 mL/hr over 30 Minutes Intravenous  Once 02/16/17 0943 02/16/17 1147      Assessment/Plan: POD 1  lap chole  1. Fulls, advance as tolerated 2. Follow lfts (I could not do cholangiogram due to length of duct- they continue to improve) 3. hct stable, can restart heparin today at noon 24 hours after surgery, no bolus 4. Does not need more than 24 hours postop abx from my standpoint, will leave up to cards when to stop   [facility] 02/22/2017

## 2017-02-23 ENCOUNTER — Inpatient Hospital Stay (HOSPITAL_COMMUNITY): Payer: Medicare Other

## 2017-02-23 LAB — CBC
HCT: 32.1 % — ABNORMAL LOW (ref 39.0–52.0)
HEMOGLOBIN: 10.2 g/dL — AB (ref 13.0–17.0)
MCH: 28.5 pg (ref 26.0–34.0)
MCHC: 31.8 g/dL (ref 30.0–36.0)
MCV: 89.7 fL (ref 78.0–100.0)
PLATELETS: 368 10*3/uL (ref 150–400)
RBC: 3.58 MIL/uL — ABNORMAL LOW (ref 4.22–5.81)
RDW: 15.5 % (ref 11.5–15.5)
WBC: 12.7 10*3/uL — ABNORMAL HIGH (ref 4.0–10.5)

## 2017-02-23 LAB — COMPREHENSIVE METABOLIC PANEL
ALBUMIN: 2.7 g/dL — AB (ref 3.5–5.0)
ALK PHOS: 159 U/L — AB (ref 38–126)
ALT: 57 U/L (ref 17–63)
ANION GAP: 6 (ref 5–15)
AST: 41 U/L (ref 15–41)
BILIRUBIN TOTAL: 0.8 mg/dL (ref 0.3–1.2)
BUN: 10 mg/dL (ref 6–20)
CALCIUM: 8.3 mg/dL — AB (ref 8.9–10.3)
CO2: 28 mmol/L (ref 22–32)
CREATININE: 0.89 mg/dL (ref 0.61–1.24)
Chloride: 104 mmol/L (ref 101–111)
GFR calc Af Amer: >60 mL/min (ref >60)
GFR calc non Af Amer: >60 mL/min (ref >60)
GLUCOSE: 127 mg/dL — AB (ref 65–99)
Potassium: 3.8 mmol/L (ref 3.5–5.1)
Sodium: 138 mmol/L (ref 135–145)
TOTAL PROTEIN: 6.3 g/dL — AB (ref 6.5–8.1)

## 2017-02-23 LAB — LACTATE DEHYDROGENASE: LDH: 198 U/L — AB (ref 98–192)

## 2017-02-23 LAB — HEPARIN LEVEL (UNFRACTIONATED)
Heparin Unfractionated: 0.35 IU/mL (ref 0.30–0.70)
Heparin Unfractionated: 0.37 IU/mL (ref 0.30–0.70)

## 2017-02-23 LAB — PROTIME-INR
INR: 1.55
Prothrombin Time: 18.7 seconds — ABNORMAL HIGH (ref 11.4–15.2)

## 2017-02-23 MED ORDER — PANTOPRAZOLE SODIUM 40 MG PO TBEC
40.0000 mg | DELAYED_RELEASE_TABLET | Freq: Every day | ORAL | Status: DC
  Administered 2017-02-23 – 2017-02-25 (×3): 40 mg via ORAL
  Filled 2017-02-23 (×3): qty 1

## 2017-02-23 MED ORDER — METOCLOPRAMIDE HCL 5 MG/ML IJ SOLN
10.0000 mg | Freq: Three times a day (TID) | INTRAMUSCULAR | Status: DC
  Administered 2017-02-23: 10 mg via INTRAVENOUS
  Filled 2017-02-23 (×2): qty 2

## 2017-02-23 MED ORDER — WARFARIN SODIUM 7.5 MG PO TABS
7.5000 mg | ORAL_TABLET | Freq: Once | ORAL | Status: AC
  Administered 2017-02-23: 7.5 mg via ORAL
  Filled 2017-02-23: qty 1

## 2017-02-23 NOTE — Discharge Instructions (Signed)
Please arrive at least 30 min before your appointment to complete your check in paperwork.  If you are unable to arrive 30 min prior to your appointment time we may have to cancel or reschedule you. ° °LAPAROSCOPIC SURGERY: POST OP INSTRUCTIONS  °1. DIET: Follow a light bland diet the first 24 hours after arrival home, such as soup, liquids, crackers, etc. Be sure to include lots of fluids daily. Avoid fast food or heavy meals as your are more likely to get nauseated. Eat a low fat the next few days after surgery.  °2. Take your usually prescribed home medications unless otherwise directed. °3. PAIN CONTROL:  °1. Pain is best controlled by a usual combination of three different methods TOGETHER:  °1. Ice/Heat °2. Over the counter pain medication °3. Prescription pain medication °2. Most patients will experience some swelling and bruising around the incisions. Ice packs or heating pads (30-60 minutes up to 6 times a day) will help. Use ice for the first few days to help decrease swelling and bruising, then switch to heat to help relax tight/sore spots and speed recovery. Some people prefer to use ice alone, heat alone, alternating between ice & heat. Experiment to what works for you. Swelling and bruising can take several weeks to resolve.  °3. It is helpful to take an over-the-counter pain medication regularly for the first few weeks. Choose one of the following that works best for you:  °1. Naproxen (Aleve, etc) Two 220mg tabs twice a day °2. Ibuprofen (Advil, etc) Three 200mg tabs four times a day (every meal & bedtime) °3. Acetaminophen (Tylenol, etc) 500-650mg four times a day (every meal & bedtime) °4. A prescription for pain medication (such as oxycodone, hydrocodone, etc) should be given to you upon discharge. Take your pain medication as prescribed.  °1. If you are having problems/concerns with the prescription medicine (does not control pain, nausea, vomiting, rash, itching, etc), please call us [phone]  [phone] to see if we need to switch you to a different pain medicine that will work better for you and/or control your side effect better. °2. If you need a refill on your pain medication, please contact your pharmacy. They will contact our office to request authorization. Prescriptions will not be filled after 5 pm or on week-ends. °4. Avoid getting constipated. Between the surgery and the pain medications, it is common to experience some constipation. Increasing fluid intake and taking a fiber supplement (such as Metamucil, Citrucel, FiberCon, MiraLax, etc) 1-2 times a day regularly will usually help prevent this problem from occurring. A mild laxative (prune juice, Milk of Magnesia, MiraLax, etc) should be taken according to package directions if there are no bowel movements after 48 hours.  °5. Watch out for diarrhea. If you have many loose bowel movements, simplify your diet to bland foods & liquids for a few days. Stop any stool softeners and decrease your fiber supplement. Switching to mild anti-diarrheal medications (Kayopectate, Pepto Bismol) can help. If this worsens or does not improve, please call us. °6. Wash / shower every day. You may shower over the dressings as they are waterproof. Continue to shower over incision(s) after the dressing is off. °7. Remove your waterproof bandages 5 days after surgery. You may leave the incision open to air. You may replace a dressing/Band-Aid to cover the incision for comfort if you wish.  °8. ACTIVITIES as tolerated:  °1. You may resume regular (light) daily activities beginning the next day--such as daily self-care, walking, climbing stairs--gradually   increasing activities as tolerated. If you can walk 30 minutes without difficulty, it is safe to try more intense activity such as jogging, treadmill, bicycling, low-impact aerobics, swimming, etc. 2. Save the most intensive and strenuous activity for last such as sit-ups, heavy lifting, contact sports, etc Refrain  from any heavy lifting or straining until you are off narcotics for pain control.  3. DO NOT PUSH THROUGH PAIN. Let pain be your guide: If it hurts to do something, don't do it. Pain is your body warning you to avoid that activity for another week until the pain goes down. 4. You may drive when you are no longer taking prescription pain medication, you can comfortably wear a seatbelt, and you can safely maneuver your car and apply brakes. 5. You may have sexual intercourse when it is comfortable.  9. FOLLOW UP in our office  1. Please call [facility] at [phone] to set up an appointment to see your surgeon in the office for a follow-up appointment approximately 2-3 weeks after your surgery. 2. Make sure that you call for this appointment the day you arrive home to insure a convenient appointment time.      10. IF YOU HAVE DISABILITY OR FAMILY LEAVE FORMS, BRING THEM TO THE               OFFICE FOR PROCESSING.   WHEN TO CALL us [phone]:  1. Poor pain control 2. Reactions / problems with new medications (rash/itching, nausea, etc)  3. Fever over 101.5 F (38.5 C) 4. Inability to urinate 5. Nausea and/or vomiting 6. Worsening swelling or bruising 7. Continued bleeding from incision. 8. Increased pain, redness, or drainage from the incision  The clinic staff is available to answer your questions during regular business hours (8:30am-5pm). Please dont hesitate to call and ask to speak to one of our nurses for clinical concerns.  If you have a medical emergency, go to the nearest emergency room or call 911.  A surgeon from [facility] is always on call at the [facility], [address], [place], [place], [place] [address] ?  MAIN: [phone] ? TOLL FREE: [phone] ?  FAX [phone]  [email or web address]    Laparoscopic Cholecystectomy, Care After This sheet gives you information about how to care for yourself  after your procedure. Your health care provider may also give you more specific instructions. If you have problems or questions, contact your health care provider. What can I expect after the procedure? After the procedure, it is common to have:  Pain at your incision sites. You will be given medicines to control this pain.  Mild nausea or vomiting.  Bloating and possible shoulder pain from the air-like gas that was used during the procedure.  Follow these instructions at home: Incision care   Follow instructions from your health care provider about how to take care of your incisions. Make sure you: ? Wash your hands with soap and water before you change your bandage (dressing). If soap and water are not available, use hand sanitizer. ? Change your dressing as told by your health care provider. ? Leave stitches (sutures), skin glue, or adhesive strips in place. These skin closures may need to be in place for 2 weeks or longer. If adhesive strip edges start to loosen and curl up, you may trim the loose edges. Do not remove adhesive strips completely unless your health care provider tells you to do that.  Do not take baths, swim, or use a hot tub until your health care provider approves. Ask your health care provider if you can take showers. You may only be allowed to take sponge baths for bathing.  Check your incision area every day for signs of infection. Check for: ? More redness, swelling, or pain. ? More fluid or blood. ? Warmth. ? Pus or a bad smell. Activity  Do not drive or use heavy machinery while taking prescription pain medicine.  Do not lift anything that is heavier than 10 lb (4.5 kg) until your health care provider approves.  Do not play contact sports until your health care provider approves.  Do not drive for 24 hours if you were given a medicine to help you relax (sedative).  Rest as needed. Do not return to work or school until your health care provider  approves. General instructions  Take over-the-counter and prescription medicines only as told by your health care provider.  To prevent or treat constipation while you are taking prescription pain medicine, your health care provider may recommend that you: ? Drink enough fluid to keep your urine clear or pale yellow. ? Take over-the-counter or prescription medicines. ? Eat foods that are high in fiber, such as fresh fruits and vegetables, whole grains, and beans. ? Limit foods that are high in fat and processed sugars, such as fried and sweet foods. Contact a health care provider if:  You develop a rash.  You have more redness, swelling, or pain around your incisions.  You have more fluid or blood coming from your incisions.  Your incisions feel warm to the touch.  You have pus or a bad smell coming from your incisions.  You have a fever.  One or more of your incisions breaks open. Get help right away if:  You have trouble breathing.  You have chest pain.  You have increasing pain in your shoulders.  You faint or feel dizzy when you stand.  You have severe pain in your abdomen.  You have nausea or vomiting that lasts for more than one day.  You have leg pain. This information is not intended to replace advice given to you by your health care provider. Make sure you discuss any questions you have with your health care provider. Document Released: 06/18/2005 Document Revised: 01/07/2016 Document Reviewed: 12/05/2015 Elsevier Interactive Patient Education  2017 [employer].

## 2017-02-23 NOTE — Progress Notes (Signed)
ANTICOAGULATION CONSULT NOTE - Follow Up Consult  Pharmacy Consult for Heparin while INR < 2 Indication: LVAD  Allergies  Allergen Reactions  . Biopatch [Chlorhexidine]     Patient Measurements: Height: 5\' 10"  (177.8 cm) Weight: 186 lb 11.7 oz (84.7 kg) IBW/kg (Calculated) : 73 Heparin Dosing Weight: 84 kg   Vital Signs: Temp: 98.3 F (36.8 C) (08/25 1216) Temp Source: Oral (08/25 1216) BP: 109/58 (08/25 0759) Pulse Rate: 106 (08/25 0759)  Labs:  Recent Labs  02/21/17 0541  02/22/17 0350 02/22/17 1806 02/23/17 0207 02/23/17 1155  HGB 10.8*  < > 10.2* 10.8* 10.2*  --   HCT 34.2*  < > 32.6* 34.3* 32.1*  --   PLT 414*  < > 362 376 368  --   LABPROT 17.9*  --  17.4*  --  18.7*  --   INR 1.47  --  1.41  --  1.55  --   HEPARINUNFRC 0.15*  --  <0.10* <0.10* 0.35 0.37  CREATININE 0.86  --  0.85  --  0.89  --   < > = values in this interval not displayed.  Estimated Creatinine Clearance: 80.9 mL/min (by C-G formula based on SCr of 0.89 mg/dL).   Assessment: 71yom with LVAD resumed on heparin  after lap chole for gallstone pancreatitis/cholecystitis.  Spoke to RN who said his IV site was oozing around 1730 and heparin was stopped "for a few minutes" while another site was obtained. - Now resolved and site clean Heparin drip 1500 uts/hr HL -.37 at goal INR 1.55 - s/p hold x6 days and Vit K 2.5mg  po x1   Goal of Therapy:  Heparin level close to 0.3 Monitor platelets by anticoagulation protocol: Yes   Plan:  Continue heparin to 1500 units/hr Warfarin 7.5mg  po x1 -  Daily HL, CBC, Protime  Bonnita Nasuti Pharm.D. CPP, BCPS Clinical Pharmacist [phone] 02/23/2017 1:53 PM

## 2017-02-23 NOTE — Progress Notes (Signed)
ANTICOAGULATION CONSULT NOTE - Follow Up Consult  Pharmacy Consult for heparin Indication: LVAD  Labs:  Recent Labs  02/21/17 0541  02/22/17 0350 02/22/17 1806 02/23/17 0207  HGB 10.8*  < > 10.2* 10.8* 10.2*  HCT 34.2*  < > 32.6* 34.3* 32.1*  PLT 414*  < > 362 376 368  LABPROT 17.9*  --  17.4*  --  18.7*  INR 1.47  --  1.41  --  1.55  HEPARINUNFRC 0.15*  --  <0.10* <0.10* 0.35  CREATININE 0.86  --  0.85  --  0.89  < > = values in this interval not displayed.   Assessment/Plan:  71yo male therapeutic on heparin after dose adjustment. Will continue gtt at current rate and confirm stable with additional level.   Wynona Neat, PharmD, BCPS  02/23/2017,3:03 AM

## 2017-02-23 NOTE — Plan of Care (Signed)
Problem: Pain Managment: Goal: General experience of comfort will improve Outcome: Progressing Pain medication converted to PO  Problem: Nutrition: Goal: Adequate nutrition will be maintained Outcome: Progressing Ate soft diet today and tolerated no nausea or vomiting  Problem: Bowel/Gastric: Goal: Will not experience complications related to bowel motility Outcome: Not Progressing Needs addressed in AM  Comments: Patient planning on home in the next day or two. Pain controlled with po analgesia

## 2017-02-23 NOTE — Progress Notes (Signed)
2 Days Post-Op    CC:  Abdominal pain  Subjective: From our standpoint he looks fine, up walking some in unit.  Tolerating diet, port sites all look good.    Objective: Vital signs in last 24 hours: Temp:  [97.6 F (36.4 C)-99 F (37.2 C)] 98.5 F (36.9 C) (08/25 0759) Pulse Rate:  [95-171] 106 (08/25 0759) Resp:  [16-27] 16 (08/25 0300) BP: (82-111)/(51-71) 109/58 (08/25 0759) SpO2:  [78 %-100 %] 100 % (08/25 0759) Arterial Line BP: (88-96)/(73-76) 96/76 (08/24 1000) Weight:  [84.7 kg (186 lb 11.7 oz)] 84.7 kg (186 lb 11.7 oz) (08/25 0555) Last BM Date: 02/23/17 120 PO recorded 452 IV 375 urine BM x 1 Afebrile VSS CMP OK - WBC 12.7- H/H stable, platelets are normal INR 1.55 NO films Intake/Output from previous day: 08/24 0701 - 08/25 0700 In: 452.9 [P.O.:120; I.V.:282.9; IV Piggyback:50] Out: 375 [Urine:375] Intake/Output this shift: No intake/output data recorded.  General appearance: alert, cooperative and no distress GI: soft, sore, tolerating diet, + BM, sites all look good.  Lab Results:   Recent Labs  02/22/17 1806 02/23/17 0207  WBC 13.9* 12.7*  HGB 10.8* 10.2*  HCT 34.3* 32.1*  PLT 376 368    BMET  Recent Labs  02/22/17 0350 02/23/17 0207  NA 137 138  K 3.9 3.8  CL 105 104  CO2 23 28  GLUCOSE 104* 127*  BUN 7 10  CREATININE 0.85 0.89  CALCIUM 8.5* 8.3*   PT/INR  Recent Labs  02/22/17 0350 02/23/17 0207  LABPROT 17.4* 18.7*  INR 1.41 1.55     Recent Labs Lab 02/19/17 0459 02/20/17 0443 02/21/17 0541 02/22/17 0350 02/23/17 0207  AST 82* 60* 45* 54* 41  ALT 120* 96* 76* 66* 57  ALKPHOS 221* 185* 188* 169* 159*  BILITOT 1.3* 1.2 1.0 1.1 0.8  PROT 6.9 6.4* 6.5 6.0* 6.3*  ALBUMIN 2.8* 2.7* 2.7* 2.5* 2.7*     Lipase     Component Value Date/Time   LIPASE 79 (H) 02/20/2017 0443   LIPASE 133 07/17/2011 1501     Medications: . aspirin EC  81 mg Oral Daily  . pantoprazole (PROTONIX) IV  40 mg Intravenous Q24H  .  rosuvastatin  10 mg Oral QHS  . traZODone  100 mg Oral QHS  . Warfarin - Pharmacist Dosing Inpatient   Does not apply q1800   . heparin 1,500 Units/hr (02/23/17 0541)   Anti-infectives    Start     Dose/Rate Route Frequency Ordered Stop   02/16/17 2200  piperacillin-tazobactam (ZOSYN) IVPB 3.375 g     3.375 g 12.5 mL/hr over 240 Minutes Intravenous Every 8 hours 02/16/17 2147 02/22/17 2359   02/16/17 0945  piperacillin-tazobactam (ZOSYN) IVPB 3.375 g     3.375 g 100 mL/hr over 30 Minutes Intravenous  Once 02/16/17 0943 02/16/17 1147      Assessment/Plan Gallstone pancreatitis/cholecystitis S/p laparoscopic cholecystectomy 02/21/17, Dr. Donne Hazel POD 2 Chronic systolic heart failure - S/P LVAD 08/2013 Chronic anticoagulation CAD Ankylosing spondylitis FEN:  Soft diet ID: Zosyn 8/18-8/24/18  DVT:  Heparin drip/coumadin restarted yesterday  Plan:  Doing well from our standpoint.  Follow up is in the AVS from our standpoint he can go when [facility] with Cardiology.     LOS: 7 days    Scott [name] 02/23/2017 [phone]

## 2017-02-23 NOTE — Progress Notes (Addendum)
Advanced Heart Failure VAD Team Note  Subjective:    Admitted with acute gallstone pancreatitis.    S/P lap cholecystectomy 8/23  Feels bloated. Belly distended. Had small BM. Walking halls. Remains on heparin/coumadin.    LVAD INTERROGATION:  HeartMate II LVAD:  Flow  4.7 liters/min, speed 9200, power 5.0  PI 5.5    Objective:    Vital Signs:   Temp:  [97.6 F (36.4 C)-99 F (37.2 C)] 98.5 F (36.9 C) (08/25 0759) Pulse Rate:  [95-107] 106 (08/25 0759) Resp:  [16-27] 16 (08/25 0300) BP: (82-111)/(51-71) 109/58 (08/25 0759) SpO2:  [98 %-100 %] 100 % (08/25 0759) Weight:  [84.7 kg (186 lb 11.7 oz)] 84.7 kg (186 lb 11.7 oz) (08/25 0555) Last BM Date: 02/23/17 Mean arterial Pressure 70-80s    Intake/Output:   Intake/Output Summary (Last 24 hours) at 02/23/17 1026 Last data filed at 02/23/17 0400  Gross per 24 hour  Intake           212.08 ml  Output              275 ml  Net           -62.92 ml     Physical Exam     Physical Exam: General:  NAD.  HEENT: normal  Neck: supple. JVP not elevated.  Carotids 2+ bilat; no bruits. No lymphadenopathy or thryomegaly appreciated. Cor: LVAD hum.  Lungs: Clear. Abdomen: surgical scars look good. soft, minimally tender, +distended. No hepatosplenomegaly. No bruits or masses. Good bowel sounds. Driveline site clean. Anchor in place.  Extremities: no cyanosis, clubbing, rash. Warm no edema  Neuro: alert & oriented x 3. No focal deficits. Moves all 4 without problem    Telemetry   Personally reviewed  NSR.   Labs   Basic Metabolic Panel:  Recent Labs Lab 02/19/17 0459 02/20/17 0443 02/21/17 0541 02/21/17 1210 02/22/17 0350 02/23/17 0207  NA 135 135 135 139 137 138  K 3.5 3.3* 3.7 3.8 3.9 3.8  CL 105 105 105  --  105 104  CO2 23 24 23   --  23 28  GLUCOSE 97 106* 104* 136* 104* 127*  BUN 15 11 9   --  7 10  CREATININE 0.99 0.82 0.86  --  0.85 0.89  CALCIUM 8.4* 8.3* 8.4*  --  8.5* 8.3*    Liver Function  Tests:  Recent Labs Lab 02/19/17 0459 02/20/17 0443 02/21/17 0541 02/22/17 0350 02/23/17 0207  AST 82* 60* 45* 54* 41  ALT 120* 96* 76* 66* 57  ALKPHOS 221* 185* 188* 169* 159*  BILITOT 1.3* 1.2 1.0 1.1 0.8  PROT 6.9 6.4* 6.5 6.0* 6.3*  ALBUMIN 2.8* 2.7* 2.7* 2.5* 2.7*    Recent Labs Lab 02/17/17 0730 02/18/17 0212 02/19/17 0459 02/20/17 0443  LIPASE 109* 99* 71* 79*   No results for input(s): AMMONIA in the last 168 hours.  CBC:  Recent Labs Lab 02/21/17 0541 02/21/17 1210 02/21/17 1852 02/22/17 0350 02/22/17 1806 02/23/17 0207  WBC 11.9*  --  17.0* 13.0* 13.9* 12.7*  HGB 10.8* 10.9* 10.2* 10.2* 10.8* 10.2*  HCT 34.2* 32.0* 32.8* 32.6* 34.3* 32.1*  MCV 89.1  --  89.9 90.1 89.8 89.7  PLT 414*  --  343 362 376 368    INR:  Recent Labs Lab 02/19/17 1827 02/20/17 0443 02/21/17 0541 02/22/17 0350 02/23/17 0207  INR 1.78 1.63 1.47 1.41 1.55    Other results:     Imaging   Dg C-arm 1-60  Min-no Report  Result Date: 02/21/2017 Fluoroscopy was utilized by the requesting physician.  No radiographic interpretation.     Medications:     Scheduled Medications: . aspirin EC  81 mg Oral Daily  . pantoprazole (PROTONIX) IV  40 mg Intravenous Q24H  . rosuvastatin  10 mg Oral QHS  . traZODone  100 mg Oral QHS  . Warfarin - Pharmacist Dosing Inpatient   Does not apply q1800    Infusions: . heparin 1,500 Units/hr (02/23/17 0541)    PRN Medications: acetaminophen, clobetasol cream, cyclobenzaprine, ondansetron (ZOFRAN) IV, oxyCODONE, traMADol   Patient Profile   71 y/o male with severe iCM and systolic HF s/p HM-II LVAD (3/16) admitted 8/18 with acute gallstone pancreatitis.    Assessment/Plan:    1. Acute gallstone pancreatitis - u/s, CT and HIDA scan all previously reviewed personally. No evidence of CBD so likely passed stone though there is evidence of ongoing cystic duct obstruction - S/P lap cholecystectomy 8/23 - [facility] has seen.  progressing well. Off zosyn. Advancing diet   2. Post-op ileus - Start Reglan - Minimize narcotics - Ambulate - D/W [facility]  3. Chronic systolic HF - s/p VAD 8/36 -Volume status stable. VAD parameters stable -On heparin/coumadin. hgb stable 10.2. INR 1.55. D/w PharmD  4. CAD - Stable. No S/S ischemia.    5. Ankylosing spondylitis - Stable. Continue pain meds prn.   I reviewed the LVAD parameters from today, and compared the results to the patient's prior recorded data.  No programming changes were made.  The LVAD is functioning within specified parameters.  The patient performs LVAD self-test daily.  LVAD interrogation was negative for any significant power changes, alarms or PI events/speed drops.  LVAD equipment check completed and is in good working order.  Back-up equipment present.   LVAD education done on emergency procedures and precautions and reviewed exit site care.    Length of Stay: 7  [name], MD 02/23/2017, 10:26 AM  VAD Team --- VAD ISSUES ONLY--- Pager [phone] (7am - 7am)  Advanced Heart Failure Team  Pager [phone] (M-F; 7a - 4p)  Please contact [facility] for night-coverage after hours (4p -7a ) and weekends on [email or web address]

## 2017-02-24 DIAGNOSIS — K9189 Other postprocedural complications and disorders of digestive system: Secondary | ICD-10-CM

## 2017-02-24 DIAGNOSIS — K567 Ileus, unspecified: Secondary | ICD-10-CM

## 2017-02-24 LAB — PROTIME-INR
INR: 1.7
Prothrombin Time: 20.2 seconds — ABNORMAL HIGH (ref 11.4–15.2)

## 2017-02-24 LAB — HEPARIN LEVEL (UNFRACTIONATED): HEPARIN UNFRACTIONATED: 0.36 [IU]/mL (ref 0.30–0.70)

## 2017-02-24 LAB — CBC
HCT: 33.2 % — ABNORMAL LOW (ref 39.0–52.0)
Hemoglobin: 10.4 g/dL — ABNORMAL LOW (ref 13.0–17.0)
MCH: 28.6 pg (ref 26.0–34.0)
MCHC: 31.3 g/dL (ref 30.0–36.0)
MCV: 91.2 fL (ref 78.0–100.0)
PLATELETS: 443 10*3/uL — AB (ref 150–400)
RBC: 3.64 MIL/uL — ABNORMAL LOW (ref 4.22–5.81)
RDW: 16 % — AB (ref 11.5–15.5)
WBC: 13.2 10*3/uL — AB (ref 4.0–10.5)

## 2017-02-24 LAB — LACTATE DEHYDROGENASE: LDH: 204 U/L — AB (ref 98–192)

## 2017-02-24 MED ORDER — SACCHAROMYCES BOULARDII 250 MG PO CAPS
250.0000 mg | ORAL_CAPSULE | Freq: Two times a day (BID) | ORAL | Status: DC
  Administered 2017-02-24 – 2017-02-25 (×3): 250 mg via ORAL
  Filled 2017-02-24 (×3): qty 1

## 2017-02-24 MED ORDER — WARFARIN SODIUM 7.5 MG PO TABS
7.5000 mg | ORAL_TABLET | Freq: Once | ORAL | Status: AC
  Administered 2017-02-24: 7.5 mg via ORAL
  Filled 2017-02-24: qty 1

## 2017-02-24 NOTE — Progress Notes (Signed)
Advanced Heart Failure VAD Team Note  Subjective:    Admitted with acute gallstone pancreatitis.    S/P lap cholecystectomy 8/23  Ab bloating improved. Now having loose stools. Reglan stopped and Florastar started. Appetite good. Remains on heparin/coumadin.    LVAD INTERROGATION:  HeartMate II LVAD:  Flow  4.9 liters/min, speed 9200, power 5.4  PI 5.1  Personally reviewed    Objective:    Vital Signs:   Temp:  [97.9 F (36.6 C)-98.7 F (37.1 C)] 98.4 F (36.9 C) (08/26 1210) Pulse Rate:  [80-112] 80 (08/26 1210) Resp:  [23-25] 23 (08/26 0740) BP: (87-96)/(67-79) 92/67 (08/26 1210) SpO2:  [98 %-100 %] 100 % (08/26 1210) Weight:  [84.5 kg (186 lb 4.6 oz)] 84.5 kg (186 lb 4.6 oz) (08/26 0537) Last BM Date: 02/23/17 Mean arterial Pressure 70s    Intake/Output:   Intake/Output Summary (Last 24 hours) at 02/24/17 1307 Last data filed at 02/24/17 1200  Gross per 24 hour  Intake              555 ml  Output                0 ml  Net              555 ml     Physical Exam     Physical Exam: General:  Lying in bed. NAD.  HEENT: normal  Neck: supple. JVP not elevated.  Carotids 2+ bilat; no bruits. No lymphadenopathy or thryomegaly appreciated. Cor: LVAD hum.  Lungs: Clear. Abdomen: obese soft, nontender, minimally distended. No hepatosplenomegaly. No bruits or masses. Hypoactive bowel sounds. Driveline site clean. Anchor in place.  Mild blistering around umbilicus Extremities: no cyanosis, clubbing, rash. Warm no edema  Neuro: alert & oriented x 3. No focal deficits. Moves all 4 without problem     Telemetry   Personally reviewed  NSR 70s.   Labs   Basic Metabolic Panel:  Recent Labs Lab 02/19/17 0459 02/20/17 0443 02/21/17 0541 02/21/17 1210 02/22/17 0350 02/23/17 0207  NA 135 135 135 139 137 138  K 3.5 3.3* 3.7 3.8 3.9 3.8  CL 105 105 105  --  105 104  CO2 23 24 23   --  23 28  GLUCOSE 97 106* 104* 136* 104* 127*  BUN 15 11 9   --  7 10  CREATININE  0.99 0.82 0.86  --  0.85 0.89  CALCIUM 8.4* 8.3* 8.4*  --  8.5* 8.3*    Liver Function Tests:  Recent Labs Lab 02/19/17 0459 02/20/17 0443 02/21/17 0541 02/22/17 0350 02/23/17 0207  AST 82* 60* 45* 54* 41  ALT 120* 96* 76* 66* 57  ALKPHOS 221* 185* 188* 169* 159*  BILITOT 1.3* 1.2 1.0 1.1 0.8  PROT 6.9 6.4* 6.5 6.0* 6.3*  ALBUMIN 2.8* 2.7* 2.7* 2.5* 2.7*    Recent Labs Lab 02/18/17 0212 02/19/17 0459 02/20/17 0443  LIPASE 99* 71* 79*   No results for input(s): AMMONIA in the last 168 hours.  CBC:  Recent Labs Lab 02/21/17 1852 02/22/17 0350 02/22/17 1806 02/23/17 0207 02/24/17 0202  WBC 17.0* 13.0* 13.9* 12.7* 13.2*  HGB 10.2* 10.2* 10.8* 10.2* 10.4*  HCT 32.8* 32.6* 34.3* 32.1* 33.2*  MCV 89.9 90.1 89.8 89.7 91.2  PLT 343 362 376 368 443*    INR:  Recent Labs Lab 02/20/17 0443 02/21/17 0541 02/22/17 0350 02/23/17 0207 02/24/17 0202  INR 1.63 1.47 1.41 1.55 1.70    Other results:  Imaging   Dg Chest Port 1 View  Result Date: 02/23/2017 CLINICAL DATA:  Left ventricular assist device. EXAM: PORTABLE CHEST 1 VIEW COMPARISON:  02/16/2017 and 05/08/2016 FINDINGS: New subcutaneous gas in the right chest. No evidence for a pneumothorax. Decreased lung volumes. Again noted are streaky densities at the right lung base compatible with scarring. Heart size remains enlarged with median sternotomy wires. LVAD is partially visualized. Atherosclerotic calcifications at the aortic arch. IMPRESSION: Decreased lung volumes. Chronic streaky densities in the right lower chest compatible with scarring. Small amount of subcutaneous gas in the right chest. Negative for pneumothorax. Electronically Signed   By: Markus Daft M.D.   On: 02/23/2017 11:35     Medications:     Scheduled Medications: . aspirin EC  81 mg Oral Daily  . pantoprazole  40 mg Oral Daily  . rosuvastatin  10 mg Oral QHS  . saccharomyces boulardii  250 mg Oral BID  . traZODone  100 mg Oral  QHS  . warfarin  7.5 mg Oral ONCE-1800  . Warfarin - Pharmacist Dosing Inpatient   Does not apply q1800    Infusions: . heparin 1,500 Units/hr (02/24/17 1200)    PRN Medications: acetaminophen, clobetasol cream, cyclobenzaprine, ondansetron (ZOFRAN) IV, traMADol   Patient Profile   71 y/o male with severe iCM and systolic HF s/p HM-II LVAD (3/16) admitted 8/18 with acute gallstone pancreatitis.    Assessment/Plan:    1. Acute gallstone pancreatitis - u/s, CT and HIDA scan all previously reviewed personally. No evidence of CBD so likely passed stone though there is evidence of ongoing cystic duct obstruction - S/P lap cholecystectomy 8/23 - [facility] has seen. progressing well. Off zosyn. Advancing diet   2. Post-op ileus - improved. Off reglan. Started Radiographer, therapeutic - D/w [facility]  3. Chronic systolic HF - s/p VAD 3/53 -Volume status stable. VAD parameters stable -On heparin/coumadin. hgb stable 10.4  INR 1.70. D/w PharmD -Check LDH in am   4. CAD - Stable. No S/S ischemia.    5. Ankylosing spondylitis - Stable. Continue pain meds prn.   I reviewed the LVAD parameters from today, and compared the results to the patient's prior recorded data.  No programming changes were made.  The LVAD is functioning within specified parameters.  The patient performs LVAD self-test daily.  LVAD interrogation was negative for any significant power changes, alarms or PI events/speed drops.  LVAD equipment check completed and is in good working order.  Back-up equipment present.   LVAD education done on emergency procedures and precautions and reviewed exit site care.    Length of Stay: 8  [name], MD 02/24/2017, 1:07 PM  VAD Team --- VAD ISSUES ONLY--- Pager [phone] (7am - 7am)  Advanced Heart Failure Team  Pager [phone] (M-F; 7a - 4p)  Please contact [facility] for night-coverage after hours (4p -7a ) and weekends on [email or web address]

## 2017-02-24 NOTE — Progress Notes (Signed)
3 Days Post-Op    CC: Abdominal pain   Subjective: No real change, still a bit distended.  He has had multiple BM's yesterday, but they are all liquid.  No issue with BM prior to this Admit.  He has some blister over the umbilical site under the steri strips. I removed the steri strips and the blisters are intact. I told the wife to clean with soap and water only.  No creams.  The steristrip above that are fine.    Objective: Vital signs in last 24 hours: Temp:  [97.9 F (36.6 C)-98.7 F (37.1 C)] 98.5 F (36.9 C) (08/26 0737) Pulse Rate:  [82-112] 82 (08/26 0740) Resp:  [23-25] 23 (08/26 0740) BP: (87-96)/(70-79) 92/74 (08/26 0740) SpO2:  [98 %-100 %] 98 % (08/26 0740) Weight:  [84.5 kg (186 lb 4.6 oz)] 84.5 kg (186 lb 4.6 oz) (08/26 0537) Last BM Date: 02/23/17 PO not recorded 360 IV Voided x 1 recorded BM x 1 recorded Afebrile, VSS WBC still trending up INR 1.7 CXR yesterday: Decreased lung volumes.  Chronic streaky densities in the right lower chest compatible with scarring.  Small amount of subcutaneous gas in the right chest. Negative for pneumothorax.   Intake/Output from previous day: 08/25 0701 - 08/26 0700 In: 360 [I.V.:360] Out: -  Intake/Output this shift: Total I/O In: 15 [I.V.:15] Out: -   General appearance: alert, cooperative and no distress GI: still distended some but he thinks it is better.  Sites OK main port has some blisters as noted above.    Lab Results:   Recent Labs  02/23/17 0207 02/24/17 0202  WBC 12.7* 13.2*  HGB 10.2* 10.4*  HCT 32.1* 33.2*  PLT 368 443*    BMET  Recent Labs  02/22/17 0350 02/23/17 0207  NA 137 138  K 3.9 3.8  CL 105 104  CO2 23 28  GLUCOSE 104* 127*  BUN 7 10  CREATININE 0.85 0.89  CALCIUM 8.5* 8.3*   PT/INR  Recent Labs  02/23/17 0207 02/24/17 0202  LABPROT 18.7* 20.2*  INR 1.55 1.70     Recent Labs Lab 02/19/17 0459 02/20/17 0443 02/21/17 0541 02/22/17 0350 02/23/17 0207  AST 82*  60* 45* 54* 41  ALT 120* 96* 76* 66* 57  ALKPHOS 221* 185* 188* 169* 159*  BILITOT 1.3* 1.2 1.0 1.1 0.8  PROT 6.9 6.4* 6.5 6.0* 6.3*  ALBUMIN 2.8* 2.7* 2.7* 2.5* 2.7*     Lipase     Component Value Date/Time   LIPASE 79 (H) 02/20/2017 0443   LIPASE 133 07/17/2011 1501     Medications: . aspirin EC  81 mg Oral Daily  . metoCLOPramide (REGLAN) injection  10 mg Intravenous Q8H  . pantoprazole  40 mg Oral Daily  . rosuvastatin  10 mg Oral QHS  . traZODone  100 mg Oral QHS  . Warfarin - Pharmacist Dosing Inpatient   Does not apply q1800   . heparin 1,500 Units/hr (02/24/17 0800)    Assessment/Plan Gallstone pancreatitis/cholecystitis S/p laparoscopic cholecystectomy 02/21/17, Dr. Donne Hazel POD 2 Chronic systolic heart failure - S/P LVAD 08/2013 Chronic anticoagulation CAD Ankylosing spondylitis FEN:  Soft diet ID: Zosyn 8/18-8/24/18  DVT:  Heparin drip/coumadin restarted  - INR 1.7 today      LOS: 8 days    Cashae Weich 02/24/2017 [phone]

## 2017-02-24 NOTE — Progress Notes (Signed)
As per wife driveline dressing was done last Thursday during surgery and just applied anchor.

## 2017-02-24 NOTE — Progress Notes (Signed)
ANTICOAGULATION CONSULT NOTE - Follow Up Consult  Pharmacy Consult for Heparin > warfarin Indication: LVAD  Allergies  Allergen Reactions  . Biopatch [Chlorhexidine]     Patient Measurements: Height: 5\' 10"  (177.8 cm) Weight: 186 lb 4.6 oz (84.5 kg) IBW/kg (Calculated) : 73 Heparin Dosing Weight: 84 kg   Vital Signs: Temp: 98.4 F (36.9 C) (08/26 1210) Temp Source: Oral (08/26 1210) BP: 92/67 (08/26 1210) Pulse Rate: 80 (08/26 1210)  Labs:  Recent Labs  02/22/17 0350 02/22/17 1806 02/23/17 0207 02/23/17 1155 02/24/17 0202  HGB 10.2* 10.8* 10.2*  --  10.4*  HCT 32.6* 34.3* 32.1*  --  33.2*  PLT 362 376 368  --  443*  LABPROT 17.4*  --  18.7*  --  20.2*  INR 1.41  --  1.55  --  1.70  HEPARINUNFRC <0.10* <0.10* 0.35 0.37 0.36  CREATININE 0.85  --  0.89  --   --     Estimated Creatinine Clearance: 80.9 mL/min (by C-G formula based on SCr of 0.89 mg/dL).   Assessment: 71yom with LVAD resumed on heparin  after lap chole for gallstone pancreatitis/cholecystitis. Heparin drip 1500 uts/hr HL 0.36 at goal INR 1.7 slowly rising -  s/p hold x6 days and Vit K 2.5mg  po x1   Goal of Therapy:  Heparin level close to 0.3 INr 2-2.5 Monitor platelets by anticoagulation protocol: Yes   Plan:  Continue heparin to 1500 units/hr Warfarin 7.5mg  po x1 repeat  Daily HL, CBC, Protime  Bonnita Nasuti Pharm.D. CPP, BCPS Clinical Pharmacist [phone] 02/24/2017 12:12 PM

## 2017-02-25 LAB — CBC
HCT: 31 % — ABNORMAL LOW (ref 39.0–52.0)
HEMOGLOBIN: 9.7 g/dL — AB (ref 13.0–17.0)
MCH: 28.6 pg (ref 26.0–34.0)
MCHC: 31.3 g/dL (ref 30.0–36.0)
MCV: 91.4 fL (ref 78.0–100.0)
PLATELETS: 456 10*3/uL — AB (ref 150–400)
RBC: 3.39 MIL/uL — ABNORMAL LOW (ref 4.22–5.81)
RDW: 16.2 % — AB (ref 11.5–15.5)
WBC: 14 10*3/uL — ABNORMAL HIGH (ref 4.0–10.5)

## 2017-02-25 LAB — BASIC METABOLIC PANEL
Anion gap: 5 (ref 5–15)
BUN: 11 mg/dL (ref 6–20)
CALCIUM: 8.1 mg/dL — AB (ref 8.9–10.3)
CO2: 27 mmol/L (ref 22–32)
CREATININE: 0.87 mg/dL (ref 0.61–1.24)
Chloride: 106 mmol/L (ref 101–111)
GFR calc Af Amer: >60 mL/min (ref >60)
GFR calc non Af Amer: >60 mL/min (ref >60)
Glucose, Bld: 107 mg/dL — ABNORMAL HIGH (ref 65–99)
POTASSIUM: 3.2 mmol/L — AB (ref 3.5–5.1)
Sodium: 138 mmol/L (ref 135–145)

## 2017-02-25 LAB — HEPARIN LEVEL (UNFRACTIONATED): HEPARIN UNFRACTIONATED: 0.32 [IU]/mL (ref 0.30–0.70)

## 2017-02-25 LAB — PROTIME-INR
INR: 2.26
Prothrombin Time: 25.3 seconds — ABNORMAL HIGH (ref 11.4–15.2)

## 2017-02-25 LAB — LACTATE DEHYDROGENASE: LDH: 180 U/L (ref 98–192)

## 2017-02-25 MED ORDER — SACCHAROMYCES BOULARDII 250 MG PO CAPS: 250.0000 mg | ORAL_CAPSULE | Freq: Two times a day (BID) | ORAL | 3 refills | Status: DC

## 2017-02-25 MED ORDER — WARFARIN SODIUM 4 MG PO TABS: ORAL_TABLET | ORAL | 0 refills | Status: DC

## 2017-02-25 MED ORDER — POTASSIUM CHLORIDE CRYS ER 20 MEQ PO TBCR
60.0000 meq | EXTENDED_RELEASE_TABLET | Freq: Once | ORAL | Status: DC
  Filled 2017-02-25: qty 3

## 2017-02-25 NOTE — Progress Notes (Signed)
Patient to be discharged today. Patient and wife state they have adequate dressing supplies at home. All home equipment will be sent with him.  Will f/u in clinic next week.  Balinda Quails RN, VAD Coordinator 24/7 pager [phone]

## 2017-02-25 NOTE — Discharge Summary (Signed)
Advanced Heart Failure Team  Discharge Summary   Patient ID: Scott [name] MRN: [id number], DOB/AGE: [birth year] 72 y.o. Admit date: 02/16/2017 D/C date:     02/25/2017   Primary Discharge Diagnoses:  1. Acute gallstone pancreatitis s/p lap chole 02/21/17 2. Post-op ileus 3. Chronic systolic HF s/p LVAD 12/9483 4. CAD 5. Ankylosing spondylitis  Hospital Course:   Scott [name] is a 71 y/o with h/o CAD with ankylosing spondylitis, DM2, carotid stenting, severe ischemic CM s/p anterior STEM with VT arrest in 12/15, large PE with pulmonary infarct in 2/15 s/p IVC filter and systolic HF with EF 46%.  Admitted from [facility] 02/16/17 with 3 days severe post-prandial ABD pain and N/V. WBC 18k and lipase 686.   [place], CT, and HIDA scan with no evidence of CBD so thought stone had passed. There was evidence of ongoing cystid duct obstruction, so [facility] recommended lap chole.   Pt underwent lap chole 02/21/17 after coumadin held for several days. Post op course complicated by illeus which slowly resolved. VAD parameters remains stable this admission.   INR trended up slowly but took large jump from 1.7 -> 2.2 on day of discharge. He will take 4 mg coumadin daily x 2, then return to daily dose of 6 mg daily starting 02/27/17.  He will be discharged to home today in stable condition with instructions to return should he have any worsening symptoms.   HeartMate II LVAD:  Flow 4.8 liters/min, speed 9200, power 5.0 PI 5.4  Back up speed: 8600  Discharge Weight: 187 lbs Discharge Vitals: Blood pressure 96/83, pulse 98, temperature 98.5 F (36.9 C), temperature source Oral, resp. rate (!) 27, height 5\' 10"  (1.778 m), weight 187 lb 4.8 oz (85 kg), SpO2 96 %.  Labs: Lab Results  Component Value Date   WBC 14.0 (H) 02/25/2017   HGB 9.7 (L) 02/25/2017   HCT 31.0 (L) 02/25/2017   MCV 91.4 02/25/2017   PLT 456 (H) 02/25/2017     Recent Labs Lab 02/23/17 0207 02/25/17 0143  NA 138 138  K 3.8 3.2*  CL 104 106   CO2 28 27  BUN 10 11  CREATININE 0.89 0.87  CALCIUM 8.3* 8.1*  PROT 6.3*  --   BILITOT 0.8  --   ALKPHOS 159*  --   ALT 57  --   AST 41  --   GLUCOSE 127* 107*   Lab Results  Component Value Date   CHOL 155 07/10/2016   HDL 48 07/10/2016   LDLCALC 98 07/10/2016   TRIG 47 07/10/2016   BNP (last 3 results) No results for input(s): BNP in the last 8760 hours.  ProBNP (last 3 results) No results for input(s): PROBNP in the last 8760 hours.   Diagnostic Studies/Procedures   No results found.  Discharge Medications   Allergies as of 02/25/2017      Reactions   Biopatch [chlorhexidine]       Medication List    TAKE these medications   acetaminophen 500 MG tablet Commonly known as:  TYLENOL Take 1,000 mg by mouth every 6 (six) hours as needed for moderate pain.   aspirin EC 81 MG tablet Take 1 tablet (81 mg total) by mouth daily.   clobetasol cream 0.05 % Commonly known as:  TEMOVATE Apply 1 application topically 2 (two) times daily.   cyclobenzaprine 5 MG tablet Commonly known as:  FLEXERIL Take 1 tablet (5 mg total) by mouth 3 (three) times daily as needed for muscle spasms.  furosemide 20 MG tablet Commonly known as:  LASIX Every other day alternate 20 mg (1 pill) with 40 mg (2 pills)   pantoprazole 40 MG tablet Commonly known as:  PROTONIX Take 1 tablet (40 mg total) by mouth at bedtime.   potassium chloride SA 20 MEQ tablet Commonly known as:  K-DUR,KLOR-CON Every other day alternate 20 mEq (1 tablet) with 40 mEq (2 tablets)   rosuvastatin 10 MG tablet Commonly known as:  CRESTOR Take 1 tablet (10 mg total) by mouth at bedtime.   saccharomyces boulardii 250 MG capsule Commonly known as:  FLORASTOR Take 1 capsule (250 mg total) by mouth 2 (two) times daily.   sildenafil 100 MG tablet Commonly known as:  VIAGRA Take 1 tablet (100 mg total) by mouth daily as needed for erectile dysfunction. What changed:  how much to take   traMADol 50 MG  tablet Commonly known as:  ULTRAM Take 50 mg by mouth every 12 (twelve) hours as needed for moderate pain.   traZODone 100 MG tablet Commonly known as:  DESYREL Take 1 tablet (100 mg total) by mouth at bedtime.   warfarin 4 MG tablet Commonly known as:  COUMADIN Take 4 mg today, 02/25/17 and 02/26/17. Resume 6 mg daily dosing on 02/27/17 What changed:  how much to take  how to take this  when to take this  additional instructions            Discharge Care Instructions        Start     Ordered   02/25/17 0000  warfarin (COUMADIN) 4 MG tablet    Question:  Supervising Provider  Answer:  Scott [name], SPACE   02/25/17 1101   02/25/17 0000  saccharomyces boulardii (FLORASTOR) 250 MG capsule  2 times daily    Question:  Supervising Provider  Answer:  LOMAX, POEHLER   02/25/17 1101   02/25/17 0000  Diet - low sodium heart healthy     02/25/17 1101   02/25/17 0000  Increase activity slowly     02/25/17 1101   02/25/17 0000  Heart Failure patients record your daily weight using the same scale at the same time of day     02/25/17 1101   02/25/17 0000  Discharge instructions    Comments:  Please send INR on Thursday, 02/28/17   02/25/17 1101   02/25/17 0000  (HEART FAILURE PATIENTS) Call MD:  Anytime you have any of the following symptoms: 1) 3 pound weight gain in 24 hours or 5 pounds in 1 week 2) shortness of breath, with or without a dry hacking cough 3) swelling in the hands, feet or stomach 4) if you have to sleep on extra pillows at night in order to breathe.     02/25/17 1101      Disposition   The patient will be discharged in stable condition to home. Discharge Instructions    (HEART FAILURE PATIENTS) Call MD:  Anytime you have any of the following symptoms: 1) 3 pound weight gain in 24 hours or 5 pounds in 1 week 2) shortness of breath, with or without a dry hacking cough 3) swelling in the hands, feet or stomach 4) if you have to sleep on extra pillows at  night in order to breathe.    Complete by:  As directed    Diet - low sodium heart healthy    Complete by:  As directed    Discharge instructions    Complete by:  As directed    Please send INR on Thursday, 02/28/17   Heart Failure patients record your daily weight using the same scale at the same time of day    Complete by:  As directed    Increase activity slowly    Complete by:  As directed      Follow-up Information    Rolm Bookbinder, MD. Call.   Specialty:  General Surgery Why:  as soon as possible to schedule post-operative appointment in upcoming 2-4 weeks. Contact information: [address] [place] [address] [phone]        [name], MD Follow up on 03/06/2017.   Specialty:  Cardiology Why:  at 0900 for post hospital follow up. The code for parking is 7002. Contact information: [address] [place] [address] [phone]             Duration of Discharge Encounter: Greater than 35 minutes   Signed, Annamaria Helling  02/25/2017, 11:01 AM  Patient seen and examined with the above-signed Advanced Practice Provider and/or Housestaff. I personally reviewed laboratory data, imaging studies and relevant notes. I independently examined the patient and formulated the important aspects of the plan. I have edited the note to reflect any of my changes or salient points. I have personally discussed the plan with the patient and/or family.  Agree. He is ready for d/c. F/u in VAD Clinic  [name], MD  11:05 AM

## 2017-02-25 NOTE — Care Management Note (Signed)
Case Management Note Previous Note Completed by Tomi Bamberger  Patient Details  Name: Scott [name] MRN: [id number] Date of Birth: [birth year]  Subjective/Objective:  LVAD pt  Admitted with acute gallstone, pancreatitis , s/p lap chole, pta indep, from home with wife.                    Action/Plan: NCM will follow for dc needs.   Expected Discharge Date:                  Expected Discharge Plan:  [place]  In-House Referral:     Discharge planning Services  CM Consult  Post Acute Care Choice:    Choice offered to:     DME Arranged:    DME Agency:     HH Arranged:    [place] Agency:     Status of Service:  In process, will continue to follow  If discussed at Long Length of Stay Meetings, dates discussed:    Additional Comments: 02/25/2017 Pt to discharge home today- wife is at bedside.  Pt alert and oriented during assessment.  Pt states he is completely independent at home and doesn't need HH nor DME.  Pt is very active with HF clinic and denied barriers to obtaining/paying for medications as prescribed. No CM needs determined prior to discharge. Maryclare Labrador, RN 02/25/2017, 10:03 AM

## 2017-02-25 NOTE — Progress Notes (Signed)
ANTICOAGULATION CONSULT NOTE - Follow Up Consult  Pharmacy Consult for Heparin > Warfarin Indication: LVAD  Allergies  Allergen Reactions  . Biopatch [Chlorhexidine]     Patient Measurements: Height: 5\' 10"  (177.8 cm) Weight: 187 lb 4.8 oz (85 kg) IBW/kg (Calculated) : 73 Heparin Dosing Weight: 84 kg   Vital Signs: Temp: 98.5 F (36.9 C) (08/27 0800) Temp Source: Oral (08/27 0800) BP: 96/83 (08/27 0800) Pulse Rate: 98 (08/27 0800)  Labs:  Recent Labs  02/23/17 0207 02/23/17 1155 02/24/17 0202 02/25/17 0143  HGB 10.2*  --  10.4* 9.7*  HCT 32.1*  --  33.2* 31.0*  PLT 368  --  443* 456*  LABPROT 18.7*  --  20.2* 25.3*  INR 1.55  --  1.70 2.26  HEPARINUNFRC 0.35 0.37 0.36 0.32  CREATININE 0.89  --   --  0.87    Estimated Creatinine Clearance: 82.7 mL/min (by C-G formula based on SCr of 0.87 mg/dL).  Medications: Heparin @ 1500 units/hr  Assessment: 71yom with LVAD resumed on heparin  after lap chole for gallstone pancreatitis/cholecystitis. Heparin level is therapeutic 0.32 but will discontinue today as INR is finally therapeutic 2.26. Warfarin was resumed on 8/24 after holding x 6 days and vitamin k 2.5mg . He received 3 boosted doses of 7.5mg . Anticipate that INR will continue to trend up so will decrease dose for a few days then back to home dose.  PTA dose: 6mg  daily  Goal of Therapy:  Heparin level close to 0.3 INr 2-2.5 Monitor platelets by anticoagulation protocol: Yes   Plan:  1) Stop heparin 2) Discussed discharge regimen with HF team, warfarin 4mg  x 2 days then resume 6mg  daily  Nena Jordan, PharmD, BCPS 02/25/2017 10:16 AM

## 2017-02-25 NOTE — Progress Notes (Signed)
4 Days Post-Op   Subjective/Chief Complaint: 4-5 loose stools yesterday, no n/v, doing fine   Objective: Vital signs in last 24 hours: Temp:  [97.9 F (36.6 C)-98.8 F (37.1 C)] 98.2 F (36.8 C) (08/27 0339) Pulse Rate:  [80-99] 99 (08/26 2341) Resp:  [23-27] 27 (08/27 0339) BP: (82-96)/(47-83) 91/77 (08/27 0339) SpO2:  [95 %-100 %] 96 % (08/27 0339) Weight:  [85 kg (187 lb 4.8 oz)] 85 kg (187 lb 4.8 oz) (08/27 0339) Last BM Date: 02/24/17  Intake/Output from previous day: 08/26 0701 - 08/27 0700 In: 690 [P.O.:390; I.V.:300] Out: 4 [Urine:2; Stool:2] Intake/Output this shift: No intake/output data recorded.  GI: soft approp tender incisions clean some blistering at umbilicus from strips now off  Lab Results:   Recent Labs  02/24/17 0202 02/25/17 0143  WBC 13.2* 14.0*  HGB 10.4* 9.7*  HCT 33.2* 31.0*  PLT 443* 456*   BMET  Recent Labs  02/23/17 0207 02/25/17 0143  NA 138 138  K 3.8 3.2*  CL 104 106  CO2 28 27  GLUCOSE 127* 107*  BUN 10 11  CREATININE 0.89 0.87  CALCIUM 8.3* 8.1*   PT/INR  Recent Labs  02/24/17 0202 02/25/17 0143  LABPROT 20.2* 25.3*  INR 1.70 2.26    Anti-infectives: Anti-infectives    Start     Dose/Rate Route Frequency Ordered Stop   02/16/17 2200  piperacillin-tazobactam (ZOSYN) IVPB 3.375 g     3.375 g 12.5 mL/hr over 240 Minutes Intravenous Every 8 hours 02/16/17 2147 02/22/17 2359   02/16/17 0945  piperacillin-tazobactam (ZOSYN) IVPB 3.375 g     3.375 g 100 mL/hr over 30 Minutes Intravenous  Once 02/16/17 0943 02/16/17 1147      Assessment/Plan: POD 3 lap chole  Doing well, would not do anything else with loose stools right now the vast majority of this will just resolve with time Will continue to follow while here hct a little lower will follow  [facility] 02/25/2017

## 2017-02-25 NOTE — Progress Notes (Signed)
Advanced Heart Failure VAD Team Note  Subjective:    Admitted with acute gallstone pancreatitis.    S/P lap cholecystectomy 8/23  No complaints this am. Worried about his INR coming up to fast and tells [place] we need to decrease dose tonight.  Denies lightheadedness or pain. Denies SOB.   Continues to have loose stools but per surgery should just follow. Will improve.   Remains on heparin/coumadin. INR 2.26 this am. Will stop heparin.    LVAD INTERROGATION:  HeartMate II LVAD:  Flow 4.8 liters/min, speed 9200, power 5.0 PI 5.4    Objective:    Vital Signs:   Temp:  [97.9 F (36.6 C)-98.8 F (37.1 C)] 98.2 F (36.8 C) (08/27 0339) Pulse Rate:  [80-99] 99 (08/26 2341) Resp:  [27] 27 (08/27 0339) BP: (82-96)/(47-83) 91/77 (08/27 0339) SpO2:  [95 %-100 %] 96 % (08/27 0339) Weight:  [187 lb 4.8 oz (85 kg)] 187 lb 4.8 oz (85 kg) (08/27 0339) Last BM Date: 02/24/17 Mean arterial Pressure 70-80s  Intake/Output:   Intake/Output Summary (Last 24 hours) at 02/25/17 0756 Last data filed at 02/25/17 0400  Gross per 24 hour  Intake              690 ml  Output                4 ml  Net              686 ml     Physical Exam    Physical Exam: GENERAL: Elderly and fatigued.  NAD.  HEENT: Normal. NECK: Supple, JVP 6-7 cm. Carotids OK.  CARDIAC:  Mechanical heart sounds with LVAD hum present.  LUNGS:  CTAB, normal effort.  ABDOMEN:  NT, ND, no HSM. No bruits or masses. +BS  LVAD exit site: Well-healed and incorporated. Dressing dry and intact. No erythema or drainage. Stabilization device present and accurately applied. Driveline dressing changed daily per sterile technique. EXTREMITIES:  Warm and dry. No cyanosis, clubbing, rash, or edema.  NEUROLOGIC:  Alert & oriented x 3. Cranial nerves grossly intact. Moves all 4 extremities w/o difficulty. Affect pleasant     Telemetry   Personally reviewed, NSR 70s     Labs   Basic Metabolic Panel:  Recent Labs Lab 02/20/17 0443  02/21/17 0541 02/21/17 1210 02/22/17 0350 02/23/17 0207 02/25/17 0143  NA 135 135 139 137 138 138  K 3.3* 3.7 3.8 3.9 3.8 3.2*  CL 105 105  --  105 104 106  CO2 24 23  --  23 28 27   GLUCOSE 106* 104* 136* 104* 127* 107*  BUN 11 9  --  7 10 11   CREATININE 0.82 0.86  --  0.85 0.89 0.87  CALCIUM 8.3* 8.4*  --  8.5* 8.3* 8.1*    Liver Function Tests:  Recent Labs Lab 02/19/17 0459 02/20/17 0443 02/21/17 0541 02/22/17 0350 02/23/17 0207  AST 82* 60* 45* 54* 41  ALT 120* 96* 76* 66* 57  ALKPHOS 221* 185* 188* 169* 159*  BILITOT 1.3* 1.2 1.0 1.1 0.8  PROT 6.9 6.4* 6.5 6.0* 6.3*  ALBUMIN 2.8* 2.7* 2.7* 2.5* 2.7*    Recent Labs Lab 02/19/17 0459 02/20/17 0443  LIPASE 71* 79*   No results for input(s): AMMONIA in the last 168 hours.  CBC:  Recent Labs Lab 02/22/17 0350 02/22/17 1806 02/23/17 0207 02/24/17 0202 02/25/17 0143  WBC 13.0* 13.9* 12.7* 13.2* 14.0*  HGB 10.2* 10.8* 10.2* 10.4* 9.7*  HCT 32.6* 34.3* 32.1*  33.2* 31.0*  MCV 90.1 89.8 89.7 91.2 91.4  PLT 362 376 368 443* 456*    INR:  Recent Labs Lab 02/21/17 0541 02/22/17 0350 02/23/17 0207 02/24/17 0202 02/25/17 0143  INR 1.47 1.41 1.55 1.70 2.26    Other results:     Imaging   No results found.   Medications:     Scheduled Medications: . aspirin EC  81 mg Oral Daily  . pantoprazole  40 mg Oral Daily  . rosuvastatin  10 mg Oral QHS  . saccharomyces boulardii  250 mg Oral BID  . traZODone  100 mg Oral QHS  . Warfarin - Pharmacist Dosing Inpatient   Does not apply q1800    Infusions:   PRN Medications: acetaminophen, clobetasol cream, cyclobenzaprine, ondansetron (ZOFRAN) IV, traMADol   Patient Profile   71 y/o male with severe iCM and systolic HF s/p HM-II LVAD (3/16) admitted 8/18 with acute gallstone pancreatitis.   Assessment/Plan:    1. Acute gallstone pancreatitis - u/s, CT and HIDA scan all previously reviewed personally. No evidence of CBD so likely passed  stone though there is evidence of ongoing cystic duct obstruction - S/P lap cholecystectomy 8/23 - GSU has seen. progressing well. Off zosyn. Advancing diet.    2. Post-op ileus - Improved. Off reglan. Started Radiographer, therapeutic - D/w GSU. Now having loose stools.   3. Chronic systolic HF - s/p VAD 3/53 -Volume status stable on exam. VAD parameters stable. Personally reviewed.  -On heparin/coumadin. INR 2.26. Stop heparin. Hgb 9.7. Slightly down from yesterday.  - LDH 180.    4. CAD - Stable. No s/s ischemia.     5. Ankylosing spondylitis - Stable. Continue pain meds prn.   Improving. Will discuss dispo with MD.   I reviewed the LVAD parameters from today, and compared the results to the patient's prior recorded data.  No programming changes were made.  The LVAD is functioning within specified parameters.  The patient performs LVAD self-test daily.  LVAD interrogation was negative for any significant power changes, alarms or PI events/speed drops.  LVAD equipment check completed and is in good working order.  Back-up equipment present.   LVAD education done on emergency procedures and precautions and reviewed exit site care.   Length of Stay: [address]  Annamaria Helling 02/25/2017, 7:56 AM  VAD Team --- VAD ISSUES ONLY--- Pager [phone] (7am - 7am)  Advanced Heart Failure Team  Pager [phone] (M-F; 7a - 4p)  Please contact [facility] for night-coverage after hours (4p -7a ) and weekends on [email or web address]   Patient seen and examined with the above-signed Advanced Practice Provider and/or Housestaff. I personally reviewed laboratory data, imaging studies and relevant notes. I independently examined the patient and formulated the important aspects of the plan. I have edited the note to reflect any of my changes or salient points. I have personally discussed the plan with the patient and/or family.  Still with some loose stools but getting better. Able to keep down po intake. CHF  stable. VAD parameters stable. INR 2.2 Ok for d/c home today. Warfarin dosing discussed with PharmD.   [name], MD  11:04 AM

## 2017-02-28 ENCOUNTER — Ambulatory Visit (HOSPITAL_COMMUNITY): Payer: Self-pay | Admitting: Pharmacist

## 2017-02-28 DIAGNOSIS — Z7901 Long term (current) use of anticoagulants: Secondary | ICD-10-CM | POA: Diagnosis not present

## 2017-02-28 DIAGNOSIS — Z5181 Encounter for therapeutic drug level monitoring: Secondary | ICD-10-CM

## 2017-02-28 DIAGNOSIS — Z95811 Presence of heart assist device: Secondary | ICD-10-CM

## 2017-02-28 LAB — POCT INR: INR: 4.1

## 2017-03-05 ENCOUNTER — Other Ambulatory Visit (HOSPITAL_COMMUNITY): Payer: Self-pay | Admitting: Unknown Physician Specialty

## 2017-03-05 ENCOUNTER — Other Ambulatory Visit (HOSPITAL_COMMUNITY): Payer: Self-pay | Admitting: *Deleted

## 2017-03-05 DIAGNOSIS — Z95811 Presence of heart assist device: Secondary | ICD-10-CM

## 2017-03-05 DIAGNOSIS — Z7901 Long term (current) use of anticoagulants: Secondary | ICD-10-CM

## 2017-03-06 ENCOUNTER — Ambulatory Visit (HOSPITAL_COMMUNITY): Payer: Self-pay | Admitting: Pharmacist

## 2017-03-06 ENCOUNTER — Ambulatory Visit (HOSPITAL_COMMUNITY)
Admission: RE | Admit: 2017-03-06 | Discharge: 2017-03-06 | Disposition: A | Discharge status: Home / Self Care | Payer: Medicare Other | Source: Ambulatory Visit | Attending: Internal Medicine | Admitting: Internal Medicine

## 2017-03-06 VITALS — BP 96/71 | HR 96 | Ht 68.0 in | Wt 186.2 lb

## 2017-03-06 DIAGNOSIS — Z95820 Peripheral vascular angioplasty status with implants and grafts: Secondary | ICD-10-CM | POA: Insufficient documentation

## 2017-03-06 DIAGNOSIS — Z95810 Presence of automatic (implantable) cardiac defibrillator: Secondary | ICD-10-CM | POA: Insufficient documentation

## 2017-03-06 DIAGNOSIS — J9620 Acute and chronic respiratory failure, unspecified whether with hypoxia or hypercapnia: Secondary | ICD-10-CM | POA: Diagnosis not present

## 2017-03-06 DIAGNOSIS — E785 Hyperlipidemia, unspecified: Secondary | ICD-10-CM | POA: Insufficient documentation

## 2017-03-06 DIAGNOSIS — Z87891 Personal history of nicotine dependence: Secondary | ICD-10-CM | POA: Diagnosis not present

## 2017-03-06 DIAGNOSIS — Z5181 Encounter for therapeutic drug level monitoring: Secondary | ICD-10-CM

## 2017-03-06 DIAGNOSIS — E872 Acidosis: Secondary | ICD-10-CM | POA: Diagnosis not present

## 2017-03-06 DIAGNOSIS — Z95811 Presence of heart assist device: Secondary | ICD-10-CM

## 2017-03-06 DIAGNOSIS — Z86711 Personal history of pulmonary embolism: Secondary | ICD-10-CM | POA: Insufficient documentation

## 2017-03-06 DIAGNOSIS — I2510 Atherosclerotic heart disease of native coronary artery without angina pectoris: Secondary | ICD-10-CM | POA: Insufficient documentation

## 2017-03-06 DIAGNOSIS — Z8249 Family history of ischemic heart disease and other diseases of the circulatory system: Secondary | ICD-10-CM | POA: Diagnosis not present

## 2017-03-06 DIAGNOSIS — Z9889 Other specified postprocedural states: Secondary | ICD-10-CM | POA: Insufficient documentation

## 2017-03-06 DIAGNOSIS — I5022 Chronic systolic (congestive) heart failure: Secondary | ICD-10-CM | POA: Diagnosis not present

## 2017-03-06 DIAGNOSIS — E119 Type 2 diabetes mellitus without complications: Secondary | ICD-10-CM | POA: Insufficient documentation

## 2017-03-06 DIAGNOSIS — N529 Male erectile dysfunction, unspecified: Secondary | ICD-10-CM | POA: Insufficient documentation

## 2017-03-06 DIAGNOSIS — Z7901 Long term (current) use of anticoagulants: Secondary | ICD-10-CM | POA: Insufficient documentation

## 2017-03-06 DIAGNOSIS — M459 Ankylosing spondylitis of unspecified sites in spine: Secondary | ICD-10-CM | POA: Diagnosis not present

## 2017-03-06 DIAGNOSIS — K851 Biliary acute pancreatitis without necrosis or infection: Secondary | ICD-10-CM

## 2017-03-06 DIAGNOSIS — M452 Ankylosing spondylitis of cervical region: Secondary | ICD-10-CM | POA: Diagnosis not present

## 2017-03-06 DIAGNOSIS — I110 Hypertensive heart disease with heart failure: Secondary | ICD-10-CM | POA: Insufficient documentation

## 2017-03-06 DIAGNOSIS — I420 Dilated cardiomyopathy: Secondary | ICD-10-CM | POA: Diagnosis not present

## 2017-03-06 DIAGNOSIS — I10 Essential (primary) hypertension: Secondary | ICD-10-CM

## 2017-03-06 DIAGNOSIS — I255 Ischemic cardiomyopathy: Secondary | ICD-10-CM | POA: Diagnosis not present

## 2017-03-06 DIAGNOSIS — Z7982 Long term (current) use of aspirin: Secondary | ICD-10-CM | POA: Insufficient documentation

## 2017-03-06 DIAGNOSIS — Z9049 Acquired absence of other specified parts of digestive tract: Secondary | ICD-10-CM | POA: Diagnosis not present

## 2017-03-06 DIAGNOSIS — I252 Old myocardial infarction: Secondary | ICD-10-CM | POA: Insufficient documentation

## 2017-03-06 DIAGNOSIS — Z87442 Personal history of urinary calculi: Secondary | ICD-10-CM | POA: Diagnosis not present

## 2017-03-06 DIAGNOSIS — Z86718 Personal history of other venous thrombosis and embolism: Secondary | ICD-10-CM | POA: Diagnosis not present

## 2017-03-06 LAB — CBC
HEMATOCRIT: 37.7 % — AB (ref 39.0–52.0)
HEMOGLOBIN: 11.9 g/dL — AB (ref 13.0–17.0)
MCH: 28.4 pg (ref 26.0–34.0)
MCHC: 31.6 g/dL (ref 30.0–36.0)
MCV: 90 fL (ref 78.0–100.0)
Platelets: 471 10*3/uL — ABNORMAL HIGH (ref 150–400)
RBC: 4.19 MIL/uL — AB (ref 4.22–5.81)
RDW: 15.6 % — ABNORMAL HIGH (ref 11.5–15.5)
WBC: 9.1 10*3/uL (ref 4.0–10.5)

## 2017-03-06 LAB — COMPREHENSIVE METABOLIC PANEL
ALBUMIN: 3.2 g/dL — AB (ref 3.5–5.0)
ALK PHOS: 138 U/L — AB (ref 38–126)
ALT: 35 U/L (ref 17–63)
ANION GAP: 7 (ref 5–15)
AST: 44 U/L — ABNORMAL HIGH (ref 15–41)
BUN: 12 mg/dL (ref 6–20)
CALCIUM: 9.2 mg/dL (ref 8.9–10.3)
CO2: 23 mmol/L (ref 22–32)
Chloride: 106 mmol/L (ref 101–111)
Creatinine, Ser: 1.09 mg/dL (ref 0.61–1.24)
GFR calc Af Amer: >60 mL/min (ref >60)
GFR calc non Af Amer: >60 mL/min (ref >60)
GLUCOSE: 173 mg/dL — AB (ref 65–99)
Potassium: 4.4 mmol/L (ref 3.5–5.1)
SODIUM: 136 mmol/L (ref 135–145)
Total Bilirubin: 0.4 mg/dL (ref 0.3–1.2)
Total Protein: 7.7 g/dL (ref 6.5–8.1)

## 2017-03-06 LAB — LACTATE DEHYDROGENASE: LDH: 239 U/L — ABNORMAL HIGH (ref 98–192)

## 2017-03-06 LAB — PREALBUMIN: Prealbumin: 22.1 mg/dL (ref 18–38)

## 2017-03-06 LAB — PROTIME-INR
INR: 2.06
PROTHROMBIN TIME: 23 s — AB (ref 11.4–15.2)

## 2017-03-06 MED ORDER — TRAMADOL HCL 50 MG PO TABS: 50.0000 mg | ORAL_TABLET | Freq: Two times a day (BID) | ORAL | 1 refills | Status: DC | PRN

## 2017-03-06 NOTE — Progress Notes (Addendum)
Patient presents for 2 hospital  follow up in [facility] today. Reports no problems with VAD equipment or concerns with drive line. Pt states that he is weak and tired today.   Vital Signs:  Doppler Pressure 86   Automatc BP: 96/71 (87) HR:96 SPO2:98  %  Weight: 186.2 b w/ eqt Last weight: 192 lb Home weights: 185 lbs   VAD Indication: Destination Therapy    VAD interrogation & Equipment Management: Speed: 9200 Flow: 4.8 Power:5.3 w    PI:6.5  Alarms: pt has no external power on 8/18-pt was in the ED at this time and does not know how this happened Events: rare PI events  Fixed speed 9200 Low speed limit: 8600  Primary Controller: 23 mo Back up controller:   Replace back up battery in 11 months.  Annual Equipment Maintenance completed today in clinic. Next annual maintenance May 2019. Pt/caregiver refused new pt cable.   I reviewed the LVAD parameters from today and compared the results to the patient's prior recorded data. LVAD interrogation was NEGATIVE for significant power changes, NEGATIVE for clinical alarms and STABLE for PI events/speed drops. No programming changes were made and pump is functioning within specified parameters. Pt is performing daily controller and system monitor self tests along with completing weekly and monthly maintenance for LVAD equipment.  LVAD equipment check completed and is in good working order. Back-up equipment present. Charged back up battery and performed self-test on equipment.   Exit Site Care: Drive line is being maintained weekly  by his wife. Drive line exit site well healed and incorporated. The velour is fully implanted at exit site. Dressing dry and intact. No erythema or drainage. Stabilization device present and accurately applied. Pt denies fever or chills. Given 8 anchors and 2 daily kits and 2 weekly kits.   Significant Events on VAD Support:  none   BP & Labs:  MAP 86 - Doppler is reflecting MAP.  Hgb  11.9 - No S/S of bleeding. Specifically denies melena/BRBPR or nosebleeds.  LDH stable at 239 with established baseline of 250- 320. Denies tea-colored urine. No power elevations noted on interrogation.   2.5 year Intermacs follow up completed including:  Quality of Life, KCCQ-12. Neurocognitive trail making deferred becauase pt states that he cannot see that well enough to complete trail making.   6 minute walk deferred as pt is recovering from gallbladder surgery.  Back up controller:  11V backup battery charged during this visit.   Plan: 1. Refill printed and signed for Tramadol today.  2. Order box of [place] dressings for next visit. 3. Return to clinic in 2 months.   [name] RN [place] Coordinator   Office: [phone] 24/7 Emergency VAD Pager: [phone]

## 2017-03-10 NOTE — Progress Notes (Signed)
Patient ID: Scott [name], male   DOB: 05-20-1946, 71 y.o.   MRN: [id number]   VAD CLINIC NOTE  Primary Cardiologist: Dr Haroldine Laws INR : [facility] [place]  HPI: Scott [name] is a 71 y/o with h/o CAD with ankylosing spondylitis, DM2, carotid stenting, severe ischemic CM s/p anterior STEM with VT arrest in 12/15, large PE with pulmonary infarct in 2/15 s/p IVC filter and systolic HF with EF 28%.  He underwent HM II LVAD placement on 09/20/13. He had a prolonged hospital course due to recurrent chest bleeding, severe CO2 retention and RV failure.  Recently admitted with acute gallstone pancreatitis. Underwent lap chole 02/21/17  Follow-up Presents today for post-hospital f/u. Says that when he got home had some significant edema but took his diuretics and improved. Now back to baseline. Appetite improving. Denies SOB or CP. Bowels regular. No feveres or chills. No problems with driveline. No orthopnea or PND. Cotninues with ankylosing spondylitis pain but  tramadol helps.  Weight stable.   No bleeding, fevers, chills, melena, dark urine. Performing self test every day. Wife doing dressing changes.   VAD Indication: Destination Therapy    VAD interrogation & Equipment Management: Speed: 9200 Flow: 4.8 Power:5.3 w PI:6.5  Alarms: pt has no external power on 8/18-pt was in the ED at this time and does not know how this happened Events: rare PI events  Fixed speed 9200 Low speed limit: 8600  Primary Controller: 23 mo Back up controller: Replace back up battery in 78months.   LVAD interrogation negative for power spikes and speed drops/PI events. No alarms recorded on event log and none reported by patient/caregiver. Back up equipment is present. No adjustments were made to prescribed settings. Pt/caregiver deny any alarms or VAD equipment issues.   LVAD equipment check completed and is in good working order. Back-up equipment present. LVAD education done on emergency procedures  and precautions and reviewed exit site care.    Labs (7/16): K 4.7, creatinine 1.02, LDH 301, INR 1.97, HCT 38.2 Labs (10/16): HCT 42.8 Labs (3/17): BNP 371, HCT 34.5, LDH 251, creatinine 0.97   SH:  Social History   Social History  . Marital status: Married    Spouse name: N/A  . Number of children: N/A  . Years of education: N/A   Occupational History  . retired    Social History Main Topics  . Smoking status: Former Smoker    Packs/day: 0.30    Years: 0.00    Types: Cigarettes    Quit date: 06/12/1969  . 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  . Alcohol use No  . Drug use: No  . Sexual activity: Not Currently   Other Topics Concern  . Not on file   Social History Narrative  . No narrative on file    FH:  Family History  Problem Relation Age of Onset  . Hypertension Father   . Heart attack Father   . Heart Problems Sister     Past Medical History:  Diagnosis Date  . Acute on chronic respiratory failure ([place])    a. 08/2014 in setting of PE.  Marland Kitchen Ankylosing spondylitis ([place])   . Bilateral pulmonary embolism ([place])    a. 08/2014 - started on Coumadin. Retrievable IVC filter placed 08/27/14 due to RV strain and large clot burden.  Marland Kitchen CAD (coronary artery disease)    a. stenting of LCx 2013; b. STEMI 06/12/14 s/p PCI to  LAD complicated by post cath shock requiring IABP; VT s/p DCCV, EF 20%; c. NSTEMI 06/26/14 treated medically.  . Carotid artery disease ([place])    a. s/p stenting.  . Chronic systolic CHF (congestive heart failure) ([place])   . Diabetes ([place])   . Hemoptysis    a. 08/2014 possibly due to pulm infarct/PE.  Marland Kitchen Hyperlipidemia   . Hypertension   . Hypotension   . Ischemic cardiomyopathy    a. echo 08/23/2014 EF <20%, dilated CM, mod MR/TR  . Leukocytosis   . Leukocytosis 09/10/2014  . Nephrolithiasis   . Pleural effusion on right 08/2014 - small  . Reactive thrombocytosis 09/10/2014  . Right leg DVT  ([place])    a. 08/2014.  Marland Kitchen Ventricular tachycardia ([place])    a. 06/2014 at time of MI, s/p DCCV.    Current Outpatient Prescriptions  Medication Sig Dispense Refill  . acetaminophen (TYLENOL) 500 MG tablet Take 1,000 mg by mouth every 6 (six) hours as needed for moderate pain.     Marland Kitchen aspirin EC 81 MG tablet Take 1 tablet (81 mg total) by mouth daily. 90 tablet 3  . cyclobenzaprine (FLEXERIL) 5 MG tablet Take 1 tablet (5 mg total) by mouth 3 (three) times daily as needed for muscle spasms. 270 tablet 0  . furosemide (LASIX) 20 MG tablet Every other day alternate 20 mg (1 pill) with 40 mg (2 pills) 135 tablet 3  . pantoprazole (PROTONIX) 40 MG tablet Take 1 tablet (40 mg total) by mouth at bedtime. 90 tablet 3  . potassium chloride SA (K-DUR,KLOR-CON) 20 MEQ tablet Every other day alternate 20 mEq (1 tablet) with 40 mEq (2 tablets) 135 tablet 3  . rosuvastatin (CRESTOR) 10 MG tablet Take 1 tablet (10 mg total) by mouth at bedtime. 90 tablet 3  . sildenafil (VIAGRA) 100 MG tablet Take 1 tablet (100 mg total) by mouth daily as needed for erectile dysfunction. (Patient taking differently: Take 50 mg by mouth daily as needed for erectile dysfunction. ) 10 tablet 3  . traMADol (ULTRAM) 50 MG tablet Take 1 tablet (50 mg total) by mouth every 12 (twelve) hours as needed for moderate pain. 60 tablet 1  . traZODone (DESYREL) 100 MG tablet Take 1 tablet (100 mg total) by mouth at bedtime. 30 tablet 6  . warfarin (COUMADIN) 6 MG tablet Take 6 mg by mouth daily.    . clobetasol cream (TEMOVATE) 1.61 % Apply 1 application topically 2 (two) times daily.     No current facility-administered medications for this encounter.     Vital Signs:  Doppler Pressure 86                Automatc BP: 96/71 (87) HR:96 SPO2:98  %  Weight: 186.2 b w/ eqt Last weight: 192 lb Home weights: 185 lbs   PHYSICAL EXAM: General:  NAD.  HEENT: normal  Neck: supple. JVP 6-7.  Carotids 2+ bilat; no bruits. No lymphadenopathy  or thryomegaly appreciated. Cor: LVAD hum.  Lungs: Clear. Abdomen: obese soft, nontender, non-distended. Surgical scars healing well.  No hepatosplenomegaly. No bruits or masses. Good bowel sounds. Driveline site clean. Anchor in place.  Extremities: no cyanosis, clubbing, rash. Warm no edema  Neuro: alert & oriented x 3. No focal deficits. Moves all 4 without problem    ASSESSMENT & PLAN: 1. Chronic systolic HF: Ischemic cardiomyopathy, EF 20% s/p HMII LVAD 09/20/13.   - Remains stable. NYHA class I-II. Volume status looks good  2. CAD s/p Anterior  STEMI on 06/12/14 with stenting of LAD:  --Stable no s/s ischemia. Continue statin. Off ASA due to bleeding.  3. VAD: Interrogated personally. - VAD interrogated personally. Parameters stable - Driveline site looks good. - Wife doing daily dressing changes 4. Ankylosing spondylitis: Off NSAIDS. - Still struggling with pain. Tramadol refilled  5. Gallstone pancreatitis - s/p lap chole 02/21/17. Recovering well 6. Anticoagulation: - Continue coumadin. Goal INR 1.8-2.2 due to bleeding. Off ASA. INR today 2.08 LDH stable at 239 - Discussed with PharmD 7. HTN:  - Blood pressure well controlled. Continue current regimen. 8. Erectile dysfunction  - Has PRN sildenafil  Total time spent 35 minutes. Over half that time spent discussing above.    Bensimhon, Tamar,MD 03/10/2017

## 2017-03-13 ENCOUNTER — Telehealth (HOSPITAL_COMMUNITY): Payer: Self-pay | Admitting: *Deleted

## 2017-03-13 ENCOUNTER — Telehealth (HOSPITAL_COMMUNITY): Payer: Self-pay | Admitting: Surgery
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
## 2017-03-14 ENCOUNTER — Ambulatory Visit (HOSPITAL_COMMUNITY): Payer: Self-pay | Admitting: Pharmacist

## 2017-03-14 DIAGNOSIS — Z5181 Encounter for therapeutic drug level monitoring: Secondary | ICD-10-CM

## 2017-03-14 DIAGNOSIS — Z95811 Presence of heart assist device: Secondary | ICD-10-CM

## 2017-03-14 LAB — POCT INR: INR: 2.4

## 2017-03-19 ENCOUNTER — Encounter (HOSPITAL_COMMUNITY): Payer: Medicare Other

## 2017-03-21 ENCOUNTER — Ambulatory Visit (HOSPITAL_COMMUNITY): Payer: Self-pay | Admitting: Pharmacist

## 2017-03-21 DIAGNOSIS — Z5181 Encounter for therapeutic drug level monitoring: Secondary | ICD-10-CM

## 2017-03-21 DIAGNOSIS — Z95811 Presence of heart assist device: Secondary | ICD-10-CM

## 2017-03-21 LAB — POCT INR: INR: 2.8

## 2017-03-25 ENCOUNTER — Telehealth (HOSPITAL_COMMUNITY): Payer: Self-pay | Admitting: *Deleted

## 2017-03-25 ENCOUNTER — Other Ambulatory Visit (HOSPITAL_COMMUNITY): Payer: Self-pay | Admitting: *Deleted

## 2017-03-25 DIAGNOSIS — Z95811 Presence of heart assist device: Secondary | ICD-10-CM

## 2017-03-25 NOTE — Telephone Encounter (Signed)
Patient made aware of [employer] Device Correction which applies to [employer] XS PT test strips that are used with all [employer] patient self-testing instruments. Plans made to have INR checked at our clinic or local laboratory until replacement test strips are delivered. Informed patient that md-INR will be contacting them with further details and instructions.   Lesley Wilson RN, VAD Coordinator 24/7 pager [phone]   

## 2017-03-28 ENCOUNTER — Other Ambulatory Visit (HOSPITAL_COMMUNITY): Payer: Medicare Other

## 2017-03-28 ENCOUNTER — Ambulatory Visit (HOSPITAL_COMMUNITY)
Admission: RE | Admit: 2017-03-28 | Discharge: 2017-03-28 | Disposition: A | Discharge status: Home / Self Care | Payer: Medicare Other | Source: Ambulatory Visit | Attending: Internal Medicine | Admitting: Internal Medicine

## 2017-03-28 DIAGNOSIS — Z95811 Presence of heart assist device: Secondary | ICD-10-CM | POA: Diagnosis not present

## 2017-03-28 DIAGNOSIS — Z7901 Long term (current) use of anticoagulants: Secondary | ICD-10-CM | POA: Diagnosis not present

## 2017-03-28 LAB — PROTIME-INR
INR: 2.07
PROTHROMBIN TIME: 23.1 s — AB (ref 11.4–15.2)

## 2017-03-29 ENCOUNTER — Ambulatory Visit (HOSPITAL_COMMUNITY): Payer: Self-pay | Admitting: Pharmacist

## 2017-03-29 ENCOUNTER — Other Ambulatory Visit (HOSPITAL_COMMUNITY): Payer: Medicare Other

## 2017-03-29 DIAGNOSIS — Z5181 Encounter for therapeutic drug level monitoring: Secondary | ICD-10-CM

## 2017-03-29 DIAGNOSIS — Z95811 Presence of heart assist device: Secondary | ICD-10-CM

## 2017-04-02 ENCOUNTER — Telehealth (HOSPITAL_COMMUNITY): Payer: Self-pay | Admitting: Unknown Physician Specialty

## 2017-04-02 NOTE — Telephone Encounter (Signed)
Wife called to inform us that [employer] called and all of [employer] INR test strips have been recalled. Appt made for INR at heart failure clinic for this Thursday 10/4.  [name] RN, BSN VAD Coordinator Pager [phone] (7am - 7am)

## 2017-04-04 ENCOUNTER — Ambulatory Visit (HOSPITAL_COMMUNITY)
Admission: RE | Admit: 2017-04-04 | Discharge: 2017-04-04 | Disposition: A | Discharge status: Home / Self Care | Payer: Medicare Other | Source: Ambulatory Visit | Attending: Cardiology | Admitting: Cardiology

## 2017-04-04 ENCOUNTER — Ambulatory Visit (HOSPITAL_COMMUNITY): Payer: Self-pay | Admitting: Pharmacist

## 2017-04-04 ENCOUNTER — Other Ambulatory Visit (HOSPITAL_COMMUNITY): Payer: Self-pay | Admitting: *Deleted

## 2017-04-04 DIAGNOSIS — Z5181 Encounter for therapeutic drug level monitoring: Secondary | ICD-10-CM

## 2017-04-04 DIAGNOSIS — Z7901 Long term (current) use of anticoagulants: Secondary | ICD-10-CM | POA: Diagnosis not present

## 2017-04-04 DIAGNOSIS — Z95811 Presence of heart assist device: Secondary | ICD-10-CM | POA: Diagnosis not present

## 2017-04-04 LAB — PROTIME-INR
INR: 2.19
Prothrombin Time: 24.2 s — ABNORMAL HIGH (ref 11.4–15.2)

## 2017-04-11 ENCOUNTER — Other Ambulatory Visit (HOSPITAL_COMMUNITY): Payer: Self-pay | Admitting: *Deleted

## 2017-04-11 ENCOUNTER — Ambulatory Visit (HOSPITAL_COMMUNITY): Payer: Self-pay | Admitting: Pharmacist

## 2017-04-11 ENCOUNTER — Ambulatory Visit (HOSPITAL_COMMUNITY)
Admission: RE | Admit: 2017-04-11 | Discharge: 2017-04-11 | Disposition: A | Discharge status: Home / Self Care | Payer: Medicare Other | Source: Ambulatory Visit | Attending: Cardiology | Admitting: Cardiology

## 2017-04-11 DIAGNOSIS — Z7901 Long term (current) use of anticoagulants: Secondary | ICD-10-CM | POA: Diagnosis not present

## 2017-04-11 DIAGNOSIS — Z95811 Presence of heart assist device: Secondary | ICD-10-CM | POA: Diagnosis not present

## 2017-04-11 DIAGNOSIS — Z5181 Encounter for therapeutic drug level monitoring: Secondary | ICD-10-CM

## 2017-04-11 LAB — PROTIME-INR
INR: 2.32
Prothrombin Time: 25.3 seconds — ABNORMAL HIGH (ref 11.4–15.2)

## 2017-04-18 ENCOUNTER — Ambulatory Visit (HOSPITAL_COMMUNITY): Payer: Self-pay | Admitting: Pharmacist

## 2017-04-18 ENCOUNTER — Ambulatory Visit (HOSPITAL_COMMUNITY)
Admission: RE | Admit: 2017-04-18 | Discharge: 2017-04-18 | Disposition: A | Discharge status: Home / Self Care | Payer: Medicare Other | Source: Ambulatory Visit | Attending: Cardiology | Admitting: Cardiology

## 2017-04-18 DIAGNOSIS — Z95811 Presence of heart assist device: Secondary | ICD-10-CM | POA: Insufficient documentation

## 2017-04-18 DIAGNOSIS — Z5181 Encounter for therapeutic drug level monitoring: Secondary | ICD-10-CM | POA: Diagnosis not present

## 2017-04-18 LAB — PROTIME-INR
INR: 2.26
Prothrombin Time: 24.7 seconds — ABNORMAL HIGH (ref 11.4–15.2)

## 2017-04-25 ENCOUNTER — Other Ambulatory Visit (HOSPITAL_COMMUNITY): Payer: Medicare Other

## 2017-04-25 ENCOUNTER — Ambulatory Visit (HOSPITAL_COMMUNITY)
Admission: RE | Admit: 2017-04-25 | Discharge: 2017-04-25 | Disposition: A | Discharge status: Home / Self Care | Payer: Medicare Other | Source: Ambulatory Visit | Attending: Internal Medicine | Admitting: Internal Medicine

## 2017-04-25 ENCOUNTER — Ambulatory Visit (HOSPITAL_COMMUNITY): Payer: Self-pay | Admitting: Unknown Physician Specialty

## 2017-04-25 DIAGNOSIS — Z95811 Presence of heart assist device: Secondary | ICD-10-CM

## 2017-04-25 DIAGNOSIS — Z5181 Encounter for therapeutic drug level monitoring: Secondary | ICD-10-CM | POA: Diagnosis not present

## 2017-04-25 DIAGNOSIS — Z7901 Long term (current) use of anticoagulants: Secondary | ICD-10-CM | POA: Diagnosis not present

## 2017-04-25 LAB — PROTIME-INR
INR: 1.91
PROTHROMBIN TIME: 21.7 s — AB (ref 11.4–15.2)

## 2017-05-06 ENCOUNTER — Encounter (HOSPITAL_COMMUNITY): Payer: Self-pay

## 2017-05-06 ENCOUNTER — Ambulatory Visit (HOSPITAL_COMMUNITY)
Admission: RE | Admit: 2017-05-06 | Discharge: 2017-05-06 | Disposition: A | Discharge status: Home / Self Care | Payer: Medicare Other | Source: Ambulatory Visit | Attending: Cardiology | Admitting: Cardiology

## 2017-05-06 ENCOUNTER — Ambulatory Visit (HOSPITAL_COMMUNITY): Payer: Self-pay | Admitting: Pharmacist

## 2017-05-06 VITALS — BP 91/54 | HR 85 | Resp 16 | Ht 68.0 in | Wt 191.4 lb

## 2017-05-06 DIAGNOSIS — Z86718 Personal history of other venous thrombosis and embolism: Secondary | ICD-10-CM | POA: Diagnosis not present

## 2017-05-06 DIAGNOSIS — Z95811 Presence of heart assist device: Secondary | ICD-10-CM

## 2017-05-06 DIAGNOSIS — I2510 Atherosclerotic heart disease of native coronary artery without angina pectoris: Secondary | ICD-10-CM | POA: Diagnosis not present

## 2017-05-06 DIAGNOSIS — Z9049 Acquired absence of other specified parts of digestive tract: Secondary | ICD-10-CM | POA: Insufficient documentation

## 2017-05-06 DIAGNOSIS — E119 Type 2 diabetes mellitus without complications: Secondary | ICD-10-CM | POA: Diagnosis not present

## 2017-05-06 DIAGNOSIS — Z87442 Personal history of urinary calculi: Secondary | ICD-10-CM | POA: Insufficient documentation

## 2017-05-06 DIAGNOSIS — Z8249 Family history of ischemic heart disease and other diseases of the circulatory system: Secondary | ICD-10-CM | POA: Diagnosis not present

## 2017-05-06 DIAGNOSIS — I5020 Unspecified systolic (congestive) heart failure: Secondary | ICD-10-CM | POA: Diagnosis present

## 2017-05-06 DIAGNOSIS — M459 Ankylosing spondylitis of unspecified sites in spine: Secondary | ICD-10-CM | POA: Diagnosis not present

## 2017-05-06 DIAGNOSIS — I255 Ischemic cardiomyopathy: Secondary | ICD-10-CM | POA: Diagnosis not present

## 2017-05-06 DIAGNOSIS — Z86711 Personal history of pulmonary embolism: Secondary | ICD-10-CM | POA: Diagnosis not present

## 2017-05-06 DIAGNOSIS — E785 Hyperlipidemia, unspecified: Secondary | ICD-10-CM | POA: Diagnosis not present

## 2017-05-06 DIAGNOSIS — Z5181 Encounter for therapeutic drug level monitoring: Secondary | ICD-10-CM

## 2017-05-06 DIAGNOSIS — Z87891 Personal history of nicotine dependence: Secondary | ICD-10-CM | POA: Diagnosis not present

## 2017-05-06 DIAGNOSIS — M452 Ankylosing spondylitis of cervical region: Secondary | ICD-10-CM | POA: Diagnosis not present

## 2017-05-06 DIAGNOSIS — I5022 Chronic systolic (congestive) heart failure: Secondary | ICD-10-CM | POA: Diagnosis not present

## 2017-05-06 DIAGNOSIS — Z7901 Long term (current) use of anticoagulants: Secondary | ICD-10-CM

## 2017-05-06 DIAGNOSIS — I252 Old myocardial infarction: Secondary | ICD-10-CM | POA: Diagnosis not present

## 2017-05-06 DIAGNOSIS — I110 Hypertensive heart disease with heart failure: Secondary | ICD-10-CM | POA: Diagnosis not present

## 2017-05-06 DIAGNOSIS — N529 Male erectile dysfunction, unspecified: Secondary | ICD-10-CM | POA: Diagnosis not present

## 2017-05-06 DIAGNOSIS — Z79899 Other long term (current) drug therapy: Secondary | ICD-10-CM | POA: Diagnosis not present

## 2017-05-06 DIAGNOSIS — T829XXD Unspecified complication of cardiac and vascular prosthetic device, implant and graft, subsequent encounter: Secondary | ICD-10-CM

## 2017-05-06 LAB — BASIC METABOLIC PANEL
Anion gap: 6 (ref 5–15)
BUN: 15 mg/dL (ref 6–20)
CALCIUM: 9 mg/dL (ref 8.9–10.3)
CHLORIDE: 109 mmol/L (ref 101–111)
CO2: 23 mmol/L (ref 22–32)
CREATININE: 1.01 mg/dL (ref 0.61–1.24)
GFR calc non Af Amer: >60 mL/min (ref >60)
Glucose, Bld: 89 mg/dL (ref 65–99)
POTASSIUM: 4.4 mmol/L (ref 3.5–5.1)
SODIUM: 138 mmol/L (ref 135–145)

## 2017-05-06 LAB — CBC
HCT: 37.6 % — ABNORMAL LOW (ref 39.0–52.0)
HEMOGLOBIN: 11.7 g/dL — AB (ref 13.0–17.0)
MCH: 27.3 pg (ref 26.0–34.0)
MCHC: 31.1 g/dL (ref 30.0–36.0)
MCV: 87.9 fL (ref 78.0–100.0)
Platelets: 316 10*3/uL (ref 150–400)
RBC: 4.28 MIL/uL (ref 4.22–5.81)
RDW: 15.7 % — ABNORMAL HIGH (ref 11.5–15.5)
WBC: 7.1 10*3/uL (ref 4.0–10.5)

## 2017-05-06 LAB — PROTIME-INR
INR: 2.31
Prothrombin Time: 25.2 seconds — ABNORMAL HIGH (ref 11.4–15.2)

## 2017-05-06 LAB — LACTATE DEHYDROGENASE: LDH: 275 U/L — ABNORMAL HIGH (ref 98–192)

## 2017-05-06 NOTE — Progress Notes (Signed)
Patient presents for 2 month  follow up in [facility] today. Reports no problems with VAD equipment or concerns with drive line.  Vital Signs:  Doppler Pressure 90   Automatc BP: 91/54 (73) HR:85   SPO2:96  %  Weight: 191.4 lb w/o eqt Last weight: 186.2 lb Home weights: 185 lbs   VAD Indication: Destination Therapy- patient choice    VAD interrogation & Equipment Management: Speed:9200 Flow: 4.3 Power:5.0 w    PI:6.2  Alarms: no clinical alarms Events: none  Fixed speed 9200 Low speed limit: 8600  Primary Controller:  Replace back up battery in 17months. Back up controller:   Replace back up battery in 11 months.  Annual Equipment Maintenance on UBC/PM was performed on 08/2017.   I reviewed the LVAD parameters from today and compared the results to the patient's prior recorded data. LVAD interrogation was NEGATIVE for significant power changes, NEGATIVE for clinical alarms and STABLE for PI events/speed drops. No programming changes were made and pump is functioning within specified parameters. Pt is performing daily controller and system monitor self tests along with completing weekly and monthly maintenance for LVAD equipment.  LVAD equipment check completed and is in good working order. Back-up equipment present. Charged back up battery and performed self-test on equipment.   Exit Site Care: Drive line is being maintained weekly  by [name]. Drive line exit site well healed and incorporated. The velour is fully implanted at exit site. Dressing dry and intact. No erythema or drainage. Stabilization device present and accurately applied. Pt denies fever or chills. Pt states they have adequate dressing supplies at home. 10 dressing kits given.  Significant Events on VAD Support:  02/21/17> lap chole  Device:none   BP & Labs:  MAP92 - Doppler is reflecting modified systolic  Hgb 79.8 - No S/S of bleeding. Specifically denies melena/BRBPR or nosebleeds.  LDH  stable at 275 with established baseline of 250- 320. Denies tea-colored urine. No power elevations noted on interrogation.   Plan: 1. Check lipid panel at next visit 2. Replace 8 external batteries in February. 3. RTC in 2 months.  Balinda Quails RN [place] Coordinator   Office: [phone] 24/7 Emergency VAD Pager: [phone]

## 2017-05-06 NOTE — Progress Notes (Signed)
Patient ID: Scott [name], male   DOB: 23-Mar-1946, 71 y.o.   MRN: [id number]   VAD CLINIC NOTE  Primary Cardiologist: Dr Haroldine Laws INR : [facility]  HPI: Scott [name] is a 70 y/o with h/o CAD with ankylosing spondylitis, DM2, carotid stenting, severe ischemic CM s/p anterior STEM with VT arrest in 12/15, large PE with pulmonary infarct in 2/15 s/p IVC filter and systolic HF with EF 09%.  He underwent HM II LVAD placement on 09/20/13. He had a prolonged hospital course due to recurrent chest bleeding, severe CO2 retention and RV failure.  In 8/18 admitted with acute gallstone pancreatitis. Underwent lap chole 02/21/17  Follow-up Presents today for routine VAD f/u. Doing very well. Stays active without any CP or SOB. No edema. Has gained weight back after GB surgery and now back to baseline 185 pounds. No edema, orthopnea or PND. No problems with driveline or VAD alarms. No neuro symptoms.  No bleeding, fevers, chills, melena, dark urine. Performing self test every day. Wife doing dressing changes.    VAD Indication: Destination Therapy- patient choice    VAD interrogation & Equipment Management: Speed:9200 Flow: 4.3 Power:5.0 w PI:6.2  Alarms: no clinical alarms Events: none  Fixed speed 9200 Low speed limit: 8600  Primary Controller: Replace back up battery in 40month. Back up controller: Replace back up battery in 137month  Annual Equipment Maintenance on UBC/PM was performed on 08/2017.    LVAD interrogation negative for power spikes and speed drops/PI events. No alarms recorded on event log and none reported by patient/caregiver. Back up equipment is present. No adjustments were made to prescribed settings. Pt/caregiver deny any alarms or VAD equipment issues.   LVAD equipment check completed and is in good working order. Back-up equipment present. LVAD education done on emergency procedures and precautions and reviewed exit site care.    Labs (7/16): K  4.7, creatinine 1.02, LDH 301, INR 1.97, HCT 38.2 Labs (10/16): HCT 42.8 Labs (3/17): BNP 371, HCT 34.5, LDH 251, creatinine 0.97   SH:  Social History   Socioeconomic History  . Marital status: Married    Spouse name: Not on file  . Number of children: Not on file  . Years of education: Not on file  . Highest education level: Not on file  Social Needs  . Financial resource strain: Not on file  . Food insecurity - worry: Not on file  . Food insecurity - inability: Not on file  . Transportation needs - medical: Not on file  . Transportation needs - non-medical: Not on file  Occupational History  . Occupation: retired  Tobacco Use  . Smoking status: Former Smoker    Packs/day: 0.30    Years: 0.00    Pack years: 0.00    Types: Cigarettes    Last attempt to quit: 06/12/1969    Years since quitting: 47.9  . Smokeless tobacco: Never Used  . 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  Substance and Sexual Activity  . Alcohol use: No    Alcohol/week: 0.0 oz  . Drug use: No  . Sexual activity: Not Currently  Other Topics Concern  . Not on file  Social History Narrative  . Not on file    FH:  Family History  Problem Relation Age of Onset  . Hypertension Father   . Heart attack Father   . Heart Problems Sister     Past Medical History:  Diagnosis Date  . Acute on chronic  respiratory failure ([place])    a. 08/2014 in setting of PE.  Marland Kitchen Ankylosing spondylitis ([place])   . Bilateral pulmonary embolism ([place])    a. 08/2014 - started on Coumadin. Retrievable IVC filter placed 08/27/14 due to RV strain and large clot burden.  Marland Kitchen CAD (coronary artery disease)    a. stenting of LCx 2013; b. STEMI 06/12/14 s/p PCI to LAD complicated by post cath shock requiring IABP; VT s/p DCCV, EF 20%; c. NSTEMI 06/26/14 treated medically.  . Carotid artery disease ([place])    a. s/p stenting.  . Chronic systolic CHF (congestive heart failure) ([place])   . Diabetes ([place])   .  Hemoptysis    a. 08/2014 possibly due to pulm infarct/PE.  Marland Kitchen Hyperlipidemia   . Hypertension   . Hypotension   . Ischemic cardiomyopathy    a. echo 08/23/2014 EF <20%, dilated CM, mod MR/TR  . Leukocytosis   . Leukocytosis 09/10/2014  . Nephrolithiasis   . Pleural effusion on right 08/2014 - small  . Reactive thrombocytosis 09/10/2014  . Right leg DVT ([place])    a. 08/2014.  Marland Kitchen Ventricular tachycardia ([place])    a. 06/2014 at time of MI, s/p DCCV.    Current Outpatient Medications  Medication Sig Dispense Refill  . acetaminophen (TYLENOL) 500 MG tablet Take 1,000 mg by mouth every 6 (six) hours as needed for moderate pain.     Marland Kitchen aspirin EC 81 MG tablet Take 1 tablet (81 mg total) by mouth daily. 90 tablet 3  . clobetasol cream (TEMOVATE) 6.07 % Apply 1 application topically 2 (two) times daily.    . cyclobenzaprine (FLEXERIL) 5 MG tablet Take 1 tablet (5 mg total) by mouth 3 (three) times daily as needed for muscle spasms. 270 tablet 0  . furosemide (LASIX) 20 MG tablet Every other day alternate 20 mg (1 pill) with 40 mg (2 pills) 135 tablet 3  . pantoprazole (PROTONIX) 40 MG tablet Take 1 tablet (40 mg total) by mouth at bedtime. 90 tablet 3  . potassium chloride SA (K-DUR,KLOR-CON) 20 MEQ tablet Every other day alternate 20 mEq (1 tablet) with 40 mEq (2 tablets) 135 tablet 3  . rosuvastatin (CRESTOR) 10 MG tablet Take 1 tablet (10 mg total) by mouth at bedtime. 90 tablet 3  . sildenafil (VIAGRA) 100 MG tablet Take 1 tablet (100 mg total) by mouth daily as needed for erectile dysfunction. (Patient taking differently: Take 50 mg by mouth daily as needed for erectile dysfunction. ) 10 tablet 3  . traMADol (ULTRAM) 50 MG tablet Take 1 tablet (50 mg total) by mouth every 12 (twelve) hours as needed for moderate pain. 60 tablet 1  . traZODone (DESYREL) 100 MG tablet Take 1 tablet (100 mg total) by mouth at bedtime. 30 tablet 6  . warfarin (COUMADIN) 6 MG tablet Take 6 mg by mouth daily.     No  current facility-administered medications for this encounter.     Vital Signs:  Doppler Pressure 90                Automatc BP: 91/54 (73) HR:85  SPO2:96  %  Weight: 191.4 lb w/o eqt Last weight: 186.2 lb Home weights: 185 lbs   PHYSICAL EXAM: General:  NAD.  HEENT: normal  Neck: supple. JVP not elevated.  Carotids 2+ bilat; no bruits. No lymphadenopathy or thryomegaly appreciated. Cor: LVAD hum.  Lungs: Clear. Abdomen: soft, nontender, non-distended. No hepatosplenomegaly. No bruits or masses. Good bowel sounds. Driveline site clean.  Anchor in place.  Extremities: no cyanosis, clubbing, rash. Warm no edema  Neuro: alert & oriented x 3. No focal deficits. Moves all 4 without problem    ASSESSMENT & PLAN: 1. Chronic systolic HF: Ischemic cardiomyopathy, EF 20% s/p HMII LVAD 09/20/13.   - Doing well remains NYHA I-II with VAD support. Volume status stable. - We again discussed possible transplant referral and he refuses.  2. CAD s/p Anterior STEMI on 06/12/14 with stenting of LAD:  - Stable no s/s ischemia. Continue statin. Off ASA due to bleeding.  3. VAD: Interrogated personally. - VAD interrogated personally. Parameters stable. - Driveline site looks clear. - Wife doing daily dressing changes 4. Ankylosing spondylitis: Off NSAIDS. - Still struggling with pain. Follows with rheumatology. Uses tramadol. Avoids NSAIDs as much as possible. 5. Gallstone pancreatitis - s/p lap chole 02/21/17. Recovered. 6. Anticoagulation: - Continue coumadin. Goal INR 1.8-2.2 due to bleeding. Off ASA. INR today 2.3  LDH stable at 275 - Discussed use of home INR kit with him and PharmD 7. HTN:  - Blood pressure well controlled. Continue current regimen. 8. Erectile dysfunction  - Has PRN sildenafil  Total time spent 35 minutes. Over half that time spent discussing above.    Termaine Roupp, [place],MD 05/06/2017

## 2017-05-13 ENCOUNTER — Other Ambulatory Visit (HOSPITAL_COMMUNITY): Payer: Self-pay | Admitting: *Deleted

## 2017-05-13 DIAGNOSIS — Z95811 Presence of heart assist device: Secondary | ICD-10-CM

## 2017-05-13 DIAGNOSIS — Z7901 Long term (current) use of anticoagulants: Secondary | ICD-10-CM

## 2017-05-15 ENCOUNTER — Ambulatory Visit (HOSPITAL_COMMUNITY): Payer: Self-pay | Admitting: Pharmacist

## 2017-05-15 ENCOUNTER — Ambulatory Visit (HOSPITAL_COMMUNITY)
Admission: RE | Admit: 2017-05-15 | Discharge: 2017-05-15 | Disposition: A | Discharge status: Home / Self Care | Payer: Medicare Other | Source: Ambulatory Visit | Attending: Internal Medicine | Admitting: Internal Medicine

## 2017-05-15 DIAGNOSIS — Z7901 Long term (current) use of anticoagulants: Secondary | ICD-10-CM | POA: Diagnosis not present

## 2017-05-15 DIAGNOSIS — Z5181 Encounter for therapeutic drug level monitoring: Secondary | ICD-10-CM

## 2017-05-15 DIAGNOSIS — Z95811 Presence of heart assist device: Secondary | ICD-10-CM | POA: Insufficient documentation

## 2017-05-15 LAB — PROTIME-INR
INR: 2.25
PROTHROMBIN TIME: 24.7 s — AB (ref 11.4–15.2)

## 2017-05-27 ENCOUNTER — Ambulatory Visit (HOSPITAL_COMMUNITY)
Admission: RE | Admit: 2017-05-27 | Discharge: 2017-05-27 | Disposition: A | Discharge status: Home / Self Care | Payer: Medicare Other | Source: Ambulatory Visit | Attending: Cardiology | Admitting: Cardiology

## 2017-05-27 ENCOUNTER — Ambulatory Visit (HOSPITAL_COMMUNITY): Payer: Self-pay | Admitting: Pharmacist

## 2017-05-27 DIAGNOSIS — Z95811 Presence of heart assist device: Secondary | ICD-10-CM

## 2017-05-27 DIAGNOSIS — Z5181 Encounter for therapeutic drug level monitoring: Secondary | ICD-10-CM | POA: Insufficient documentation

## 2017-05-27 LAB — PROTIME-INR
INR: 1.74
Prothrombin Time: 20.2 seconds — ABNORMAL HIGH (ref 11.4–15.2)

## 2017-05-30 ENCOUNTER — Other Ambulatory Visit (HOSPITAL_COMMUNITY): Payer: Self-pay | Admitting: *Deleted

## 2017-05-30 DIAGNOSIS — Z7901 Long term (current) use of anticoagulants: Secondary | ICD-10-CM

## 2017-05-30 DIAGNOSIS — Z95811 Presence of heart assist device: Secondary | ICD-10-CM

## 2017-06-03 ENCOUNTER — Ambulatory Visit (HOSPITAL_COMMUNITY): Payer: Self-pay | Admitting: Pharmacist

## 2017-06-03 ENCOUNTER — Ambulatory Visit (HOSPITAL_COMMUNITY)
Admission: RE | Admit: 2017-06-03 | Discharge: 2017-06-03 | Disposition: A | Discharge status: Home / Self Care | Payer: Medicare Other | Source: Ambulatory Visit | Attending: Cardiology | Admitting: Cardiology

## 2017-06-03 DIAGNOSIS — Z95811 Presence of heart assist device: Secondary | ICD-10-CM | POA: Insufficient documentation

## 2017-06-03 DIAGNOSIS — Z5181 Encounter for therapeutic drug level monitoring: Secondary | ICD-10-CM

## 2017-06-03 DIAGNOSIS — Z7901 Long term (current) use of anticoagulants: Secondary | ICD-10-CM | POA: Insufficient documentation

## 2017-06-03 LAB — PROTIME-INR
INR: 2.24
Prothrombin Time: 24.6 seconds — ABNORMAL HIGH (ref 11.4–15.2)

## 2017-06-10 ENCOUNTER — Other Ambulatory Visit (HOSPITAL_COMMUNITY): Payer: Medicare Other

## 2017-06-12 ENCOUNTER — Ambulatory Visit (HOSPITAL_COMMUNITY)
Admission: RE | Admit: 2017-06-12 | Discharge: 2017-06-12 | Disposition: A | Discharge status: Home / Self Care | Payer: Medicare Other | Source: Ambulatory Visit | Attending: Internal Medicine | Admitting: Internal Medicine

## 2017-06-12 ENCOUNTER — Ambulatory Visit (HOSPITAL_COMMUNITY): Payer: Self-pay | Admitting: Pharmacist

## 2017-06-12 DIAGNOSIS — Z5181 Encounter for therapeutic drug level monitoring: Secondary | ICD-10-CM

## 2017-06-12 DIAGNOSIS — Z95811 Presence of heart assist device: Secondary | ICD-10-CM

## 2017-06-12 LAB — PROTIME-INR
INR: 2.12
Prothrombin Time: 23.5 seconds — ABNORMAL HIGH (ref 11.4–15.2)

## 2017-06-20 ENCOUNTER — Ambulatory Visit (HOSPITAL_COMMUNITY): Payer: Self-pay | Admitting: Pharmacist

## 2017-06-20 ENCOUNTER — Ambulatory Visit (HOSPITAL_COMMUNITY)
Admission: RE | Admit: 2017-06-20 | Discharge: 2017-06-20 | Disposition: A | Discharge status: Home / Self Care | Payer: Medicare Other | Source: Ambulatory Visit | Attending: Cardiology | Admitting: Cardiology

## 2017-06-20 DIAGNOSIS — Z5181 Encounter for therapeutic drug level monitoring: Secondary | ICD-10-CM

## 2017-06-20 DIAGNOSIS — Z95811 Presence of heart assist device: Secondary | ICD-10-CM

## 2017-06-20 LAB — PROTIME-INR
INR: 2.27
Prothrombin Time: 24.9 seconds — ABNORMAL HIGH (ref 11.4–15.2)

## 2017-06-28 ENCOUNTER — Ambulatory Visit (HOSPITAL_COMMUNITY): Payer: Self-pay | Admitting: Pharmacist

## 2017-06-28 ENCOUNTER — Ambulatory Visit (HOSPITAL_COMMUNITY)
Admission: RE | Admit: 2017-06-28 | Discharge: 2017-06-28 | Disposition: A | Discharge status: Home / Self Care | Payer: Medicare Other | Source: Ambulatory Visit | Attending: Internal Medicine | Admitting: Internal Medicine

## 2017-06-28 DIAGNOSIS — Z95811 Presence of heart assist device: Secondary | ICD-10-CM

## 2017-06-28 DIAGNOSIS — Z5181 Encounter for therapeutic drug level monitoring: Secondary | ICD-10-CM | POA: Insufficient documentation

## 2017-06-28 LAB — PROTIME-INR
INR: 2.01
Prothrombin Time: 22.6 seconds — ABNORMAL HIGH (ref 11.4–15.2)

## 2017-07-09 ENCOUNTER — Ambulatory Visit (HOSPITAL_COMMUNITY): Payer: Self-pay | Admitting: Pharmacist

## 2017-07-09 ENCOUNTER — Encounter (HOSPITAL_COMMUNITY): Payer: Self-pay

## 2017-07-09 ENCOUNTER — Ambulatory Visit (HOSPITAL_COMMUNITY)
Admission: RE | Admit: 2017-07-09 | Discharge: 2017-07-09 | Disposition: A | Discharge status: Home / Self Care | Payer: Medicare Other | Source: Ambulatory Visit | Attending: Cardiology | Admitting: Cardiology

## 2017-07-09 ENCOUNTER — Other Ambulatory Visit (HOSPITAL_COMMUNITY): Payer: Self-pay | Admitting: *Deleted

## 2017-07-09 VITALS — BP 92/57 | HR 81 | Resp 16 | Ht 68.0 in | Wt 195.4 lb

## 2017-07-09 DIAGNOSIS — Z7982 Long term (current) use of aspirin: Secondary | ICD-10-CM | POA: Diagnosis not present

## 2017-07-09 DIAGNOSIS — Z79891 Long term (current) use of opiate analgesic: Secondary | ICD-10-CM | POA: Insufficient documentation

## 2017-07-09 DIAGNOSIS — Z515 Encounter for palliative care: Secondary | ICD-10-CM | POA: Diagnosis not present

## 2017-07-09 DIAGNOSIS — I480 Paroxysmal atrial fibrillation: Secondary | ICD-10-CM

## 2017-07-09 DIAGNOSIS — I2510 Atherosclerotic heart disease of native coronary artery without angina pectoris: Secondary | ICD-10-CM | POA: Insufficient documentation

## 2017-07-09 DIAGNOSIS — I110 Hypertensive heart disease with heart failure: Secondary | ICD-10-CM | POA: Insufficient documentation

## 2017-07-09 DIAGNOSIS — Z7901 Long term (current) use of anticoagulants: Secondary | ICD-10-CM | POA: Diagnosis not present

## 2017-07-09 DIAGNOSIS — I509 Heart failure, unspecified: Secondary | ICD-10-CM

## 2017-07-09 DIAGNOSIS — I5022 Chronic systolic (congestive) heart failure: Secondary | ICD-10-CM | POA: Insufficient documentation

## 2017-07-09 DIAGNOSIS — Z95811 Presence of heart assist device: Secondary | ICD-10-CM

## 2017-07-09 DIAGNOSIS — E119 Type 2 diabetes mellitus without complications: Secondary | ICD-10-CM | POA: Diagnosis not present

## 2017-07-09 DIAGNOSIS — G479 Sleep disorder, unspecified: Secondary | ICD-10-CM

## 2017-07-09 DIAGNOSIS — I252 Old myocardial infarction: Secondary | ICD-10-CM | POA: Insufficient documentation

## 2017-07-09 DIAGNOSIS — I5023 Acute on chronic systolic (congestive) heart failure: Secondary | ICD-10-CM | POA: Diagnosis not present

## 2017-07-09 DIAGNOSIS — N529 Male erectile dysfunction, unspecified: Secondary | ICD-10-CM | POA: Diagnosis not present

## 2017-07-09 DIAGNOSIS — Z79899 Other long term (current) drug therapy: Secondary | ICD-10-CM | POA: Insufficient documentation

## 2017-07-09 DIAGNOSIS — R0609 Other forms of dyspnea: Secondary | ICD-10-CM

## 2017-07-09 DIAGNOSIS — I255 Ischemic cardiomyopathy: Secondary | ICD-10-CM | POA: Diagnosis not present

## 2017-07-09 DIAGNOSIS — M459 Ankylosing spondylitis of unspecified sites in spine: Secondary | ICD-10-CM | POA: Diagnosis not present

## 2017-07-09 DIAGNOSIS — I5041 Acute combined systolic (congestive) and diastolic (congestive) heart failure: Secondary | ICD-10-CM

## 2017-07-09 DIAGNOSIS — Z5181 Encounter for therapeutic drug level monitoring: Secondary | ICD-10-CM

## 2017-07-09 LAB — CBC
HCT: 39 % (ref 39.0–52.0)
Hemoglobin: 11.7 g/dL — ABNORMAL LOW (ref 13.0–17.0)
MCH: 25.9 pg — AB (ref 26.0–34.0)
MCHC: 30 g/dL (ref 30.0–36.0)
MCV: 86.5 fL (ref 78.0–100.0)
PLATELETS: 354 10*3/uL (ref 150–400)
RBC: 4.51 MIL/uL (ref 4.22–5.81)
RDW: 16.3 % — AB (ref 11.5–15.5)
WBC: 7.3 10*3/uL (ref 4.0–10.5)

## 2017-07-09 LAB — PROTIME-INR
INR: 2.14
PROTHROMBIN TIME: 23.7 s — AB (ref 11.4–15.2)

## 2017-07-09 LAB — BASIC METABOLIC PANEL
Anion gap: 8 (ref 5–15)
BUN: 15 mg/dL (ref 6–20)
CALCIUM: 9.1 mg/dL (ref 8.9–10.3)
CO2: 26 mmol/L (ref 22–32)
CREATININE: 1.09 mg/dL (ref 0.61–1.24)
Chloride: 103 mmol/L (ref 101–111)
GFR calc Af Amer: >60 mL/min (ref >60)
Glucose, Bld: 106 mg/dL — ABNORMAL HIGH (ref 65–99)
Potassium: 4.4 mmol/L (ref 3.5–5.1)
SODIUM: 137 mmol/L (ref 135–145)

## 2017-07-09 LAB — LIPID PANEL
CHOL/HDL RATIO: 3.1 ratio
Cholesterol: 143 mg/dL (ref 0–200)
HDL: 46 mg/dL (ref >40)
LDL CALC: 88 mg/dL (ref 0–99)
Triglycerides: 46 mg/dL (ref <150)
VLDL: 9 mg/dL (ref 0–40)

## 2017-07-09 LAB — LACTATE DEHYDROGENASE: LDH: 343 U/L — ABNORMAL HIGH (ref 98–192)

## 2017-07-09 MED ORDER — WARFARIN SODIUM 6 MG PO TABS: 6.0000 mg | ORAL_TABLET | Freq: Every day | ORAL | 6 refills | Status: DC

## 2017-07-09 MED ORDER — TRAMADOL HCL 50 MG PO TABS: 50.0000 mg | ORAL_TABLET | Freq: Two times a day (BID) | ORAL | 1 refills | Status: DC | PRN

## 2017-07-09 MED ORDER — POTASSIUM CHLORIDE CRYS ER 20 MEQ PO TBCR: EXTENDED_RELEASE_TABLET | ORAL | 3 refills | Status: DC

## 2017-07-09 MED ORDER — PANTOPRAZOLE SODIUM 40 MG PO TBEC: 40.0000 mg | DELAYED_RELEASE_TABLET | Freq: Every day | ORAL | 3 refills | Status: DC

## 2017-07-09 MED ORDER — CYCLOBENZAPRINE HCL 5 MG PO TABS: 5.0000 mg | ORAL_TABLET | Freq: Three times a day (TID) | ORAL | 3 refills | Status: DC | PRN

## 2017-07-09 MED ORDER — ROSUVASTATIN CALCIUM 10 MG PO TABS: 10.0000 mg | ORAL_TABLET | Freq: Every day | ORAL | 3 refills | Status: DC

## 2017-07-09 MED ORDER — TRAZODONE HCL 100 MG PO TABS: 100.0000 mg | ORAL_TABLET | Freq: Every day | ORAL | 6 refills | Status: DC

## 2017-07-09 MED ORDER — FUROSEMIDE 20 MG PO TABS: ORAL_TABLET | ORAL | 3 refills | Status: DC

## 2017-07-09 NOTE — Progress Notes (Signed)
Patient ID: SAAHAS HIDROGO, male   DOB: 1946-01-15, 72 y.o.   MRN: [id number]   VAD CLINIC NOTE  Primary Cardiologist: Dr Haroldine Laws INR : [facility]   HPI: Scott [name] is a 72 y/o with h/o CAD with ankylosing spondylitis, DM2, carotid stenting, severe ischemic CM s/p anterior STEM with VT arrest in 12/15, large PE with pulmonary infarct in 2/15 s/p IVC filter and systolic HF with EF 31%.  He underwent HM II LVAD placement on 09/20/13. He had a prolonged hospital course due to recurrent chest bleeding, severe CO2 retention and RV failure.  In 8/18 admitted with acute gallstone pancreatitis. Underwent lap chole 02/21/17  Follow-up Pt presents today for regular follow up. Doing well overall. Denies CP or SOB. Weight up 4 lbs, but states it is stable at home.  Denies edema, orthopnea, or PND. Hasn't had any problems with driveline or VAD alarms. No neuro symptoms.  Specifically denies bleeding, fevers, chills, melena, dark urine, or DOE. Taking all medications as directed. Performing self test daily. Wife performs dressing changes and assists with medication.   VAD Indication: Destination Therapy- patient choice    VAD interrogation & Equipment Management: Speed:9200 Flow: 4.7 Power: 5.3 w PI: 6.4  Alarms: No clinical alarms Events: None  Fixed speed 9200 Low speed limit: 8600  Primary Controller: Replace back up battery in 19 months. Back up controller: Replace back up battery in 64months.  Annual Equipment Maintenance on UBC/PM was performed on 08/2017.   LVAD interrogation negative for power spikes and speed drops/PI events. No alarms recorded on event log and none reported by patient/caregiver. Back up equipment is present. No adjustments were made to prescribed settings. Pt/caregiver deny any alarms or VAD equipment issues.   LVAD equipment check completed and is in good working order. Back-up equipment present. LVAD education done on emergency procedures and  precautions and reviewed exit site care.   SH:  Social History   Socioeconomic History  . Marital status: Married    Spouse name: Not on file  . Number of children: Not on file  . Years of education: Not on file  . Highest education level: Not on file  Social Needs  . Financial resource strain: Not on file  . Food insecurity - worry: Not on file  . Food insecurity - inability: Not on file  . Transportation needs - medical: Not on file  . Transportation needs - non-medical: Not on file  Occupational History  . Occupation: retired  Tobacco Use  . Smoking status: Former Smoker    Packs/day: 0.30    Years: 0.00    Pack years: 0.00    Types: Cigarettes    Last attempt to quit: 06/12/1969    Years since quitting: 48.1  . Smokeless tobacco: Never Used  . 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  Substance and Sexual Activity  . Alcohol use: No    Alcohol/week: 0.0 oz  . Drug use: No  . Sexual activity: Not Currently  Other Topics Concern  . Not on file  Social History Narrative  . Not on file    FH:  Family History  Problem Relation Age of Onset  . Hypertension Father   . Heart attack Father   . Heart Problems Sister     Past Medical History:  Diagnosis Date  . Acute on chronic respiratory failure ([place])    a. 08/2014 in setting of PE.  Marland Kitchen Ankylosing spondylitis ([place])   . Bilateral  pulmonary embolism ([place])    a. 08/2014 - started on Coumadin. Retrievable IVC filter placed 08/27/14 due to RV strain and large clot burden.  Marland Kitchen CAD (coronary artery disease)    a. stenting of LCx 2013; b. STEMI 06/12/14 s/p PCI to LAD complicated by post cath shock requiring IABP; VT s/p DCCV, EF 20%; c. NSTEMI 06/26/14 treated medically.  . Carotid artery disease ([place])    a. s/p stenting.  . Chronic systolic CHF (congestive heart failure) ([place])   . Diabetes ([place])   . Hemoptysis    a. 08/2014 possibly due to pulm infarct/PE.  Marland Kitchen Hyperlipidemia   . Hypertension   .  Hypotension   . Ischemic cardiomyopathy    a. echo 08/23/2014 EF <20%, dilated CM, mod MR/TR  . Leukocytosis   . Leukocytosis 09/10/2014  . Nephrolithiasis   . Pleural effusion on right 08/2014 - small  . Reactive thrombocytosis 09/10/2014  . Right leg DVT ([place])    a. 08/2014.  Marland Kitchen Ventricular tachycardia ([place])    a. 06/2014 at time of MI, s/p DCCV.   Current Outpatient Medications  Medication Sig Dispense Refill  . acetaminophen (TYLENOL) 500 MG tablet Take 1,000 mg by mouth every 6 (six) hours as needed for moderate pain.     Marland Kitchen aspirin EC 81 MG tablet Take 1 tablet (81 mg total) by mouth daily. 90 tablet 3  . clobetasol cream (TEMOVATE) 4.27 % Apply 1 application topically 2 (two) times daily.    . cyclobenzaprine (FLEXERIL) 5 MG tablet Take 1 tablet (5 mg total) by mouth 3 (three) times daily as needed for muscle spasms. 270 tablet 3  . furosemide (LASIX) 20 MG tablet Every other day alternate 20 mg (1 pill) with 40 mg (2 pills) 135 tablet 3  . pantoprazole (PROTONIX) 40 MG tablet Take 1 tablet (40 mg total) by mouth at bedtime. 90 tablet 3  . potassium chloride SA (K-DUR,KLOR-CON) 20 MEQ tablet Every other day alternate 20 mEq (1 tablet) with 40 mEq (2 tablets) 135 tablet 3  . rosuvastatin (CRESTOR) 10 MG tablet Take 1 tablet (10 mg total) by mouth at bedtime. 90 tablet 3  . sildenafil (VIAGRA) 100 MG tablet Take 1 tablet (100 mg total) by mouth daily as needed for erectile dysfunction. (Patient taking differently: Take 50 mg by mouth daily as needed for erectile dysfunction. ) 10 tablet 3  . traMADol (ULTRAM) 50 MG tablet Take 1 tablet (50 mg total) by mouth every 12 (twelve) hours as needed for moderate pain. 60 tablet 1  . traZODone (DESYREL) 100 MG tablet Take 1 tablet (100 mg total) by mouth at bedtime. 90 tablet 6  . warfarin (COUMADIN) 6 MG tablet Take 1 tablet (6 mg total) by mouth daily. 90 tablet 6   No current facility-administered medications for this encounter.    Vital Signs:   Doppler Pressure: 96 Automatc BP: 92/57 (66) HR: 81 SPO2: 98%  Weight: 195.6 lb w/o eqt Last weight: 191.6 lbs Home weights: 190 lbs  Wt Readings from Last 3 Encounters:  07/09/17 195 lb 6.4 oz (88.6 kg)  05/06/17 191 lb 6.4 oz (86.8 kg)  03/06/17 186 lb 3.2 oz (84.5 kg)   PHYSICAL EXAM: GENERAL: Well appearing this am. NAD.  HEENT: Normal. NECK: Supple, JVP 7-8 cm. Carotids OK.  CARDIAC:  Mechanical heart sounds with LVAD hum present.  LUNGS:  CTAB, normal effort.  ABDOMEN:  NT, ND, no HSM. No bruits or masses. +BS  LVAD exit site: Well-healed  and incorporated. Dressing dry and intact. No erythema or drainage. Stabilization device present and accurately applied. Driveline dressing changed daily per sterile technique. EXTREMITIES:  Warm and dry. No cyanosis, clubbing, rash, or edema.  NEUROLOGIC:  Alert & oriented x 3. Cranial nerves grossly intact. Moves all 4 extremities w/o difficulty. Affect pleasant     ASSESSMENT & PLAN: 1. Chronic systolic HF: Ischemic cardiomyopathy, EF 20% s/p HMII LVAD 09/20/13.   - NYHA I-II with VAD support - Volume status stable.  - We have previously and continually discussed possible transplant referral and he refuses.  2. CAD s/p Anterior STEMI on 06/12/14 with stenting of LAD:  - No s/s of ischemia.    - Continue statin. Off ASA due to bleeding.  3. VAD:  - VAD interrogated personally in clinic. Parameters stable with no clinic alarms. 1-5 PI events daily.  - Driveline site stable with Wife doing daily dressing changes.  - LDH mildly elevated 275 -> 343.   No s/s pump thrombosis. INRs have been therapeutic.  4. Ankylosing spondylitis: Off NSAIDS. - Still struggling with pain. Follows with rheumatology. Uses tramadol. Avoids NSAIDs as much as possible. 5. Gallstone pancreatitis - s/p lap chole 02/21/17. Resolved. . 6. Anticoagulation: - Continue coumadin. Goal INR 1.8-2.2 due to bleeding. Off ASA. - INR 2.1 today. LDH mildly up as above.  Repeat next week. Discussed with VAD coordinator. 7. HTN:  - Stable on current regimen.  8. Erectile dysfunction  - Has PRN sildenafil  Doing well overall. Mild uptrend of LDH. No s/s of thrombosis. No low flows, dark urine, or SOB. Repeat labs next week.   Shirley Friar, PA-C  07/09/2017  Greater than 50% of the 40 minute visit was spent in counseling/coordination of care regarding disease state education, salt/fluid restriction, sliding scale diuretics, and medication compliance.

## 2017-07-09 NOTE — Progress Notes (Signed)
Patient presents for 2 month  follow up in [facility] today. Reports no problems with VAD equipment or concerns with drive line.  Vital Signs:  Doppler Pressure 96    Automatc BP: 92/57 (66) HR:81   SPO2:98  %  Weight: 195.4 lb w/o eqt Last weight: 191.4 lb Home weights: 190-195 lbs   VAD Indication: Destination Therapy patient choice    VAD interrogation & Equipment Management: Speed:9200 Flow: 4.7 Power:5.3 w    PI:6.4  Alarms: no clinical alarms Events: 1-5 PI events per day  Fixed speed 9200 Low speed limit: 8600  Primary Controller:  Replace back up battery in 3months. .  Annual Equipment Maintenance on UBC/PM was performed on 08/2017.   I reviewed the LVAD parameters from today and compared the results to the patient's prior recorded data. LVAD interrogation was NEGATIVE for significant power changes, NEGATIVE for clinical alarms and STABLE for PI events/speed drops. No programming changes were made and pump is functioning within specified parameters. Pt is performing daily controller and system monitor self tests along with completing weekly and monthly maintenance for LVAD equipment.  LVAD equipment check completed and is in good working order. Back-up equipment present. Charged back up battery and performed self-test on equipment.   Exit Site Care: Drive line is being maintained weekly  by his wife, [name]. Drive line exit site well healed and incorporated. The velour is fully implanted at exit site. Dressing dry and intact. No erythema or drainage. Stabilization device present and accurately applied. Pt denies fever or chills. Pt states they have adequate dressing supplies at home.   Significant Events on VAD Support:  02/21/17> lap chole  Device:none   BP & Labs:  MAP96 - Doppler is reflecting modified systolic  Hgb 67.1 - No S/S of bleeding. Specifically denies melena/BRBPR or nosebleeds.  LDH elevated at 343 with established baseline of 250-  320. Denies tea-colored urine. No power elevations noted on interrogation.   Plan: 1. RTC in 1 week for inr/ldh 2. RTC in 2 months. 3. Replace external batteries at next visit with annual maintenance.  Balinda Quails RN [place] Coordinator   Office: [phone] 24/7 Emergency VAD Pager: [phone]

## 2017-07-12 ENCOUNTER — Other Ambulatory Visit (HOSPITAL_COMMUNITY): Payer: Self-pay | Admitting: Unknown Physician Specialty

## 2017-07-12 DIAGNOSIS — Z7901 Long term (current) use of anticoagulants: Secondary | ICD-10-CM

## 2017-07-12 DIAGNOSIS — Z95811 Presence of heart assist device: Secondary | ICD-10-CM

## 2017-07-16 ENCOUNTER — Ambulatory Visit (HOSPITAL_COMMUNITY)
Admission: RE | Admit: 2017-07-16 | Discharge: 2017-07-16 | Disposition: A | Discharge status: Home / Self Care | Payer: Medicare Other | Source: Ambulatory Visit | Attending: Cardiology | Admitting: Cardiology

## 2017-07-16 ENCOUNTER — Ambulatory Visit (HOSPITAL_COMMUNITY): Payer: Self-pay | Admitting: Pharmacist

## 2017-07-16 DIAGNOSIS — Z95811 Presence of heart assist device: Secondary | ICD-10-CM | POA: Diagnosis not present

## 2017-07-16 DIAGNOSIS — Z7901 Long term (current) use of anticoagulants: Secondary | ICD-10-CM | POA: Diagnosis not present

## 2017-07-16 DIAGNOSIS — Z5181 Encounter for therapeutic drug level monitoring: Secondary | ICD-10-CM

## 2017-07-16 LAB — LACTATE DEHYDROGENASE: LDH: 348 U/L — ABNORMAL HIGH (ref 98–192)

## 2017-07-16 LAB — PROTIME-INR
INR: 2.1
Prothrombin Time: 23.4 seconds — ABNORMAL HIGH (ref 11.4–15.2)

## 2017-07-17 DIAGNOSIS — Z7901 Long term (current) use of anticoagulants: Secondary | ICD-10-CM | POA: Diagnosis not present

## 2017-07-25 ENCOUNTER — Ambulatory Visit (HOSPITAL_COMMUNITY): Payer: Self-pay | Admitting: Pharmacist

## 2017-07-25 ENCOUNTER — Ambulatory Visit (HOSPITAL_COMMUNITY)
Admission: RE | Admit: 2017-07-25 | Discharge: 2017-07-25 | Disposition: A | Discharge status: Home / Self Care | Payer: Medicare Other | Source: Ambulatory Visit | Attending: Internal Medicine | Admitting: Internal Medicine

## 2017-07-25 DIAGNOSIS — Z95811 Presence of heart assist device: Secondary | ICD-10-CM | POA: Diagnosis not present

## 2017-07-25 DIAGNOSIS — Z48812 Encounter for surgical aftercare following surgery on the circulatory system: Secondary | ICD-10-CM | POA: Insufficient documentation

## 2017-07-25 DIAGNOSIS — Z5181 Encounter for therapeutic drug level monitoring: Secondary | ICD-10-CM | POA: Insufficient documentation

## 2017-07-25 LAB — PROTIME-INR
INR: 2.3
PROTHROMBIN TIME: 25.1 s — AB (ref 11.4–15.2)

## 2017-07-25 LAB — LACTATE DEHYDROGENASE: LDH: 357 U/L — AB (ref 98–192)

## 2017-08-02 ENCOUNTER — Ambulatory Visit (HOSPITAL_COMMUNITY)
Admission: RE | Admit: 2017-08-02 | Discharge: 2017-08-02 | Disposition: A | Discharge status: Home / Self Care | Payer: Medicare Other | Source: Ambulatory Visit | Attending: Cardiology | Admitting: Cardiology

## 2017-08-02 ENCOUNTER — Ambulatory Visit (HOSPITAL_COMMUNITY): Payer: Self-pay | Admitting: Pharmacist

## 2017-08-02 DIAGNOSIS — Z5181 Encounter for therapeutic drug level monitoring: Secondary | ICD-10-CM | POA: Diagnosis not present

## 2017-08-02 DIAGNOSIS — Z95811 Presence of heart assist device: Secondary | ICD-10-CM

## 2017-08-02 DIAGNOSIS — Z7901 Long term (current) use of anticoagulants: Secondary | ICD-10-CM | POA: Diagnosis not present

## 2017-08-02 LAB — PROTIME-INR
INR: 2.34
PROTHROMBIN TIME: 25.4 s — AB (ref 11.4–15.2)

## 2017-08-05 ENCOUNTER — Other Ambulatory Visit (HOSPITAL_COMMUNITY): Payer: Medicare Other

## 2017-08-12 ENCOUNTER — Ambulatory Visit (HOSPITAL_COMMUNITY)
Admission: RE | Admit: 2017-08-12 | Discharge: 2017-08-12 | Disposition: A | Discharge status: Home / Self Care | Payer: Medicare Other | Source: Ambulatory Visit | Attending: Internal Medicine | Admitting: Internal Medicine

## 2017-08-12 ENCOUNTER — Ambulatory Visit (HOSPITAL_COMMUNITY): Payer: Self-pay | Admitting: Pharmacist

## 2017-08-12 DIAGNOSIS — Z5181 Encounter for therapeutic drug level monitoring: Secondary | ICD-10-CM

## 2017-08-12 DIAGNOSIS — Z95811 Presence of heart assist device: Secondary | ICD-10-CM

## 2017-08-12 LAB — PROTIME-INR
INR: 1.99
PROTHROMBIN TIME: 22.4 s — AB (ref 11.4–15.2)

## 2017-08-12 LAB — LACTATE DEHYDROGENASE: LDH: 377 U/L — AB (ref 98–192)

## 2017-08-16 ENCOUNTER — Other Ambulatory Visit (HOSPITAL_COMMUNITY): Payer: Self-pay | Admitting: Unknown Physician Specialty

## 2017-08-16 DIAGNOSIS — Z7901 Long term (current) use of anticoagulants: Secondary | ICD-10-CM

## 2017-08-16 DIAGNOSIS — Z95811 Presence of heart assist device: Secondary | ICD-10-CM

## 2017-08-19 ENCOUNTER — Ambulatory Visit (HOSPITAL_COMMUNITY): Payer: Self-pay | Admitting: Pharmacist

## 2017-08-19 ENCOUNTER — Inpatient Hospital Stay (HOSPITAL_COMMUNITY)
Admission: AD | Admit: 2017-08-19 | Discharge: 2017-08-21 | DRG: 948 | Disposition: A | Discharge status: Home / Self Care | Payer: Medicare Other | Source: Ambulatory Visit | Attending: Internal Medicine | Admitting: Internal Medicine

## 2017-08-19 ENCOUNTER — Other Ambulatory Visit: Payer: Self-pay

## 2017-08-19 ENCOUNTER — Ambulatory Visit (HOSPITAL_COMMUNITY)
Admission: RE | Admit: 2017-08-19 | Discharge: 2017-08-19 | Disposition: A | Discharge status: Home / Self Care | Payer: Medicare Other | Source: Ambulatory Visit | Attending: Internal Medicine | Admitting: Internal Medicine

## 2017-08-19 DIAGNOSIS — Z8674 Personal history of sudden cardiac arrest: Secondary | ICD-10-CM

## 2017-08-19 DIAGNOSIS — I110 Hypertensive heart disease with heart failure: Secondary | ICD-10-CM | POA: Diagnosis present

## 2017-08-19 DIAGNOSIS — Z95828 Presence of other vascular implants and grafts: Secondary | ICD-10-CM

## 2017-08-19 DIAGNOSIS — Z7901 Long term (current) use of anticoagulants: Secondary | ICD-10-CM

## 2017-08-19 DIAGNOSIS — I420 Dilated cardiomyopathy: Secondary | ICD-10-CM | POA: Diagnosis present

## 2017-08-19 DIAGNOSIS — Z4509 Encounter for adjustment and management of other cardiac device: Secondary | ICD-10-CM

## 2017-08-19 DIAGNOSIS — Z5181 Encounter for therapeutic drug level monitoring: Secondary | ICD-10-CM

## 2017-08-19 DIAGNOSIS — E785 Hyperlipidemia, unspecified: Secondary | ICD-10-CM | POA: Diagnosis present

## 2017-08-19 DIAGNOSIS — Z95811 Presence of heart assist device: Secondary | ICD-10-CM | POA: Diagnosis not present

## 2017-08-19 DIAGNOSIS — Z79899 Other long term (current) drug therapy: Secondary | ICD-10-CM | POA: Diagnosis not present

## 2017-08-19 DIAGNOSIS — R740 Nonspecific elevation of levels of transaminase and lactic acid dehydrogenase [LDH]: Secondary | ICD-10-CM | POA: Diagnosis not present

## 2017-08-19 DIAGNOSIS — E119 Type 2 diabetes mellitus without complications: Secondary | ICD-10-CM | POA: Diagnosis present

## 2017-08-19 DIAGNOSIS — M459 Ankylosing spondylitis of unspecified sites in spine: Secondary | ICD-10-CM | POA: Diagnosis present

## 2017-08-19 DIAGNOSIS — Z7982 Long term (current) use of aspirin: Secondary | ICD-10-CM

## 2017-08-19 DIAGNOSIS — I252 Old myocardial infarction: Secondary | ICD-10-CM

## 2017-08-19 DIAGNOSIS — I255 Ischemic cardiomyopathy: Secondary | ICD-10-CM | POA: Diagnosis present

## 2017-08-19 DIAGNOSIS — Z86711 Personal history of pulmonary embolism: Secondary | ICD-10-CM

## 2017-08-19 DIAGNOSIS — Z91048 Other nonmedicinal substance allergy status: Secondary | ICD-10-CM | POA: Diagnosis not present

## 2017-08-19 DIAGNOSIS — I2510 Atherosclerotic heart disease of native coronary artery without angina pectoris: Secondary | ICD-10-CM | POA: Diagnosis present

## 2017-08-19 DIAGNOSIS — I5022 Chronic systolic (congestive) heart failure: Secondary | ICD-10-CM | POA: Diagnosis not present

## 2017-08-19 DIAGNOSIS — Z8249 Family history of ischemic heart disease and other diseases of the circulatory system: Secondary | ICD-10-CM

## 2017-08-19 DIAGNOSIS — Z955 Presence of coronary angioplasty implant and graft: Secondary | ICD-10-CM | POA: Diagnosis not present

## 2017-08-19 DIAGNOSIS — Z86718 Personal history of other venous thrombosis and embolism: Secondary | ICD-10-CM

## 2017-08-19 DIAGNOSIS — T829XXA Unspecified complication of cardiac and vascular prosthetic device, implant and graft, initial encounter: Secondary | ICD-10-CM | POA: Diagnosis present

## 2017-08-19 DIAGNOSIS — R7402 Elevation of levels of lactic acid dehydrogenase (LDH): Secondary | ICD-10-CM

## 2017-08-19 LAB — BASIC METABOLIC PANEL
Anion gap: 11 (ref 5–15)
BUN: 18 mg/dL (ref 6–20)
CO2: 23 mmol/L (ref 22–32)
CREATININE: 1.13 mg/dL (ref 0.61–1.24)
Calcium: 8.9 mg/dL (ref 8.9–10.3)
Chloride: 104 mmol/L (ref 101–111)
GFR calc Af Amer: >60 mL/min (ref >60)
Glucose, Bld: 114 mg/dL — ABNORMAL HIGH (ref 65–99)
POTASSIUM: 4 mmol/L (ref 3.5–5.1)
Sodium: 138 mmol/L (ref 135–145)

## 2017-08-19 LAB — MRSA PCR SCREENING: MRSA BY PCR: NEGATIVE

## 2017-08-19 LAB — PROTIME-INR
INR: 2.26
Prothrombin Time: 24.8 seconds — ABNORMAL HIGH (ref 11.4–15.2)

## 2017-08-19 LAB — LACTATE DEHYDROGENASE: LDH: 780 U/L — AB (ref 98–192)

## 2017-08-19 MED ORDER — ONDANSETRON HCL 4 MG/2ML IJ SOLN: 4.0000 mg | Freq: Four times a day (QID) | INTRAMUSCULAR | Status: DC | PRN

## 2017-08-19 MED ORDER — ACETAMINOPHEN 325 MG PO TABS: 650.0000 mg | ORAL_TABLET | ORAL | Status: DC | PRN

## 2017-08-19 MED ORDER — SODIUM CHLORIDE 0.9 % IV SOLN
0.1000 mg/kg/h | INTRAVENOUS | Status: DC
  Administered 2017-08-19: 0.15 mg/kg/h via INTRAVENOUS
  Filled 2017-08-19: qty 250

## 2017-08-19 MED ORDER — CYCLOBENZAPRINE HCL 10 MG PO TABS: 5.0000 mg | ORAL_TABLET | Freq: Three times a day (TID) | ORAL | Status: DC | PRN

## 2017-08-19 MED ORDER — TRAZODONE HCL 50 MG PO TABS
100.0000 mg | ORAL_TABLET | Freq: Every day | ORAL | Status: DC
  Administered 2017-08-19 – 2017-08-20 (×2): 100 mg via ORAL
  Filled 2017-08-19 (×2): qty 2

## 2017-08-19 MED ORDER — TRAMADOL HCL 50 MG PO TABS: 50.0000 mg | ORAL_TABLET | Freq: Two times a day (BID) | ORAL | Status: DC | PRN

## 2017-08-19 MED ORDER — ACETAMINOPHEN 500 MG PO TABS: 1000.0000 mg | ORAL_TABLET | Freq: Four times a day (QID) | ORAL | Status: DC | PRN

## 2017-08-19 MED ORDER — PANTOPRAZOLE SODIUM 40 MG PO TBEC
40.0000 mg | DELAYED_RELEASE_TABLET | Freq: Every day | ORAL | Status: DC
  Administered 2017-08-19 – 2017-08-20 (×2): 40 mg via ORAL
  Filled 2017-08-19 (×2): qty 1

## 2017-08-19 MED ORDER — ROSUVASTATIN CALCIUM 10 MG PO TABS
10.0000 mg | ORAL_TABLET | Freq: Every day | ORAL | Status: DC
  Administered 2017-08-19 – 2017-08-20 (×2): 10 mg via ORAL
  Filled 2017-08-19 (×2): qty 1

## 2017-08-19 MED ORDER — POTASSIUM CHLORIDE CRYS ER 20 MEQ PO TBCR
40.0000 meq | EXTENDED_RELEASE_TABLET | Freq: Every day | ORAL | Status: DC
  Administered 2017-08-20 – 2017-08-21 (×2): 40 meq via ORAL
  Filled 2017-08-19 (×2): qty 2

## 2017-08-19 MED ORDER — ASPIRIN EC 81 MG PO TBEC
81.0000 mg | DELAYED_RELEASE_TABLET | Freq: Every day | ORAL | Status: DC
  Administered 2017-08-20 – 2017-08-21 (×2): 81 mg via ORAL
  Filled 2017-08-19 (×2): qty 1

## 2017-08-19 NOTE — Progress Notes (Signed)
ANTICOAGULATION CONSULT NOTE - Initial Consult  Pharmacy Consult for bivalirudin Indication: concern for pump thrombosis  Allergies  Allergen Reactions  . Biopatch [Chlorhexidine]     Patient Measurements: Height: 5' 10.5" (179.1 cm) Weight: 181 lb 14.1 oz (82.5 kg) IBW/kg (Calculated) : 74.15  Vital Signs: Temp: 98.3 F (36.8 C) (02/18 1916) Temp Source: Oral (02/18 1916) BP: 84/70 (02/18 1916) Pulse Rate: 68 (02/18 1916)  Labs: Recent Labs    08/19/17 1540 08/19/17 1845  LABPROT 24.8*  --   INR 2.26  --   CREATININE  --  1.13    Estimated Creatinine Clearance: 64.8 mL/min (by C-G formula based on SCr of 1.13 mg/dL).   Medical History: Past Medical History:  Diagnosis Date  . Acute on chronic respiratory failure ([place])    a. 08/2014 in setting of PE.  Marland Kitchen Ankylosing spondylitis ([place])   . Bilateral pulmonary embolism ([place])    a. 08/2014 - started on Coumadin. Retrievable IVC filter placed 08/27/14 due to RV strain and large clot burden.  Marland Kitchen CAD (coronary artery disease)    a. stenting of LCx 2013; b. STEMI 06/12/14 s/p PCI to LAD complicated by post cath shock requiring IABP; VT s/p DCCV, EF 20%; c. NSTEMI 06/26/14 treated medically.  . Carotid artery disease ([place])    a. s/p stenting.  . Chronic systolic CHF (congestive heart failure) ([place])   . Diabetes ([place])   . Hemoptysis    a. 08/2014 possibly due to pulm infarct/PE.  Marland Kitchen Hyperlipidemia   . Hypertension   . Hypotension   . Ischemic cardiomyopathy    a. echo 08/23/2014 EF <20%, dilated CM, mod MR/TR  . Leukocytosis   . Leukocytosis 09/10/2014  . Nephrolithiasis   . Pleural effusion on right 08/2014 - small  . Reactive thrombocytosis 09/10/2014  . Right leg DVT ([place])    a. 08/2014.  Marland Kitchen Ventricular tachycardia ([place])    a. 06/2014 at time of MI, s/p DCCV.    Medications:  Scheduled:  . aspirin EC  81 mg Oral Daily  . pantoprazole  40 mg Oral QHS  . [START ON 08/20/2017] potassium chloride SA  40 mEq Oral Daily  .  rosuvastatin  10 mg Oral QHS  . traZODone  100 mg Oral QHS    Assessment: 36 yom with history of LVAD (HMII LVAD 08/2013) found to have increasing LDH from 377 to 780. INR is therapeutic at 2.26 today. Home warfarin regimen is 6 mg daily (goal 2-2.5).   Renal function is stable today (Scr 1.13, CrCl ~64 mL/min). No signs/symptoms of bleeding. Plan to start bivalirudin for possible pump thrombosis.   Goal of Therapy:  aPTT 50-85 seconds Monitor platelets by anticoagulation protocol: Yes   Plan:  Start bivalirudin infusion at 0.15 mg/kg/hr  Obtain aPTT two hours after start of infusion Monitor daily aPTT, CBC, and INR  Doylene Canard, PharmD Clinical Pharmacist  Pager: [phone] Clinical Phone for 08/19/2017 until 3:30pm: [phone] If after 3:30pm, please call main pharmacy at [phone] 08/19/2017,8:10 PM

## 2017-08-19 NOTE — H&P (Signed)
Advanced Heart Failure VAD History and Physical Note   PCP-Cardiologist: No primary care provider on file.   Reason for Admission: VAD Complication  HPI:    [name] is a 72 year old with a history of CAD with ankylosing spondylitis, DM2, carotid stenting, ICM s/p anterior stemi with VT arrest 06/2014, PE with pulmonary infarct IVC filter 4431, chronic systolic heart failure, gallstones  S/P lap cholecystectomy, and HMII LVAD 08/2013.   Today he presented to [facility] for routine lab work. LDH with significant rise from 377>780.  Overall feeling fine. Denies PND/Orthopnea. Has ongoing SOB with exertion.  Appetite ok. No fever or chills. Weight at home has been 185-187 pounds. Says urine has been yellow. Denies BRBPR. Has had some bleeding around driveline.  Taking all medications.  LVAD INTERROGATION:  HeartMate II LVAD:  Flow 4.3 liters/min, speed 9200, power 5.7, PI 5.5 .     Review of Systems: [y] = yes, [ ]  = no   General: Weight gain [ ] ; Weight loss [ ] ; Anorexia [ ] ; Fatigue [ ] ; Fever [ ] ; Chills [ ] ; Weakness [ ]   Cardiac: Chest pain/pressure [ ] ; Resting SOB [ ] ; Exertional SOB [ Y]; Orthopnea [ ] ; Pedal Edema [ ] ; Palpitations [ ] ; Syncope [ ] ; Presyncope [ ] ; Paroxysmal nocturnal dyspnea[ ]   Pulmonary: Cough [ ] ; Wheezing[ ] ; Hemoptysis[ ] ; Sputum [ ] ; Snoring [ ]   GI: Vomiting[ ] ; Dysphagia[ ] ; Melena[ ] ; Hematochezia [ ] ; Heartburn[ ] ; Abdominal pain [ ] ; Constipation [ ] ; Diarrhea [ ] ; BRBPR [ ]   GU: Hematuria[ ] ; Dysuria [ ] ; Nocturia[ ]   Vascular: Pain in legs with walking [ ] ; Pain in feet with lying flat [ ] ; Non-healing sores [ ] ; Stroke [ ] ; TIA [ ] ; Slurred speech [ ] ;  Neuro: Headaches[ ] ; Vertigo[ ] ; Seizures[ ] ; Paresthesias[ ] ;Blurred vision [ ] ; Diplopia [ ] ; Vision changes [ ]   Ortho/Skin: Arthritis [ ] ; Joint pain [ Y]; Muscle pain [ ] ; Joint swelling [ ] ; Back Pain [ Y]; Rash [ ]   Psych: Depression[ ] ; Anxiety[ ]   Heme: Bleeding problems [ ] ;  Clotting disorders [ ] ; Anemia [ ]   Endocrine: Diabetes [ ] ; Thyroid dysfunction[ ]     Home Medications Prior to Admission medications   Medication Sig Start Date End Date Taking? Authorizing Provider  acetaminophen (TYLENOL) 500 MG tablet Take 1,000 mg by mouth every 6 (six) hours as needed for moderate pain.     [provider]  aspirin EC 81 MG tablet Take 1 tablet (81 mg total) by mouth daily. 11/12/16   Clegg, Amy D, NP  clobetasol cream (TEMOVATE) 5.40 % Apply 1 application topically 2 (two) times daily.    [provider]  cyclobenzaprine (FLEXERIL) 5 MG tablet Take 1 tablet (5 mg total) by mouth 3 (three) times daily as needed for muscle spasms. 07/09/17   Loleta Frommelt, Shaune Pascal, MD  furosemide (LASIX) 20 MG tablet Every other day alternate 20 mg (1 pill) with 40 mg (2 pills) 07/09/17   Kimley Apsey, Shaune Pascal, MD  pantoprazole (PROTONIX) 40 MG tablet Take 1 tablet (40 mg total) by mouth at bedtime. 07/09/17   Arley Salamone, Shaune Pascal, MD  potassium chloride SA (K-DUR,KLOR-CON) 20 MEQ tablet Every other day alternate 20 mEq (1 tablet) with 40 mEq (2 tablets) 07/09/17   Nethan Caudillo, Shaune Pascal, MD  rosuvastatin (CRESTOR) 10 MG tablet Take 1 tablet (10 mg total) by mouth at bedtime. 07/09/17   Shelonda Saxe,  Shaune Pascal, MD  sildenafil (VIAGRA) 100 MG tablet Take 1 tablet (100 mg total) by mouth daily as needed for erectile dysfunction. Patient taking differently: Take 50 mg by mouth daily as needed for erectile dysfunction.  09/10/16   Dayveon Halley, Shaune Pascal, MD  traMADol (ULTRAM) 50 MG tablet Take 1 tablet (50 mg total) by mouth every 12 (twelve) hours as needed for moderate pain. 07/09/17   Shirley Friar, PA-C  traZODone (DESYREL) 100 MG tablet Take 1 tablet (100 mg total) by mouth at bedtime. 07/09/17   Mckynleigh Mussell, Shaune Pascal, MD  warfarin (COUMADIN) 6 MG tablet Take 1 tablet (6 mg total) by mouth daily. 07/09/17   Ohm Dentler, Shaune Pascal, MD    Past Medical History: Past Medical History:  Diagnosis Date    . Acute on chronic respiratory failure ([place])    a. 08/2014 in setting of PE.  Marland Kitchen Ankylosing spondylitis ([place])   . Bilateral pulmonary embolism ([place])    a. 08/2014 - started on Coumadin. Retrievable IVC filter placed 08/27/14 due to RV strain and large clot burden.  Marland Kitchen CAD (coronary artery disease)    a. stenting of LCx 2013; b. STEMI 06/12/14 s/p PCI to LAD complicated by post cath shock requiring IABP; VT s/p DCCV, EF 20%; c. NSTEMI 06/26/14 treated medically.  . Carotid artery disease ([place])    a. s/p stenting.  . Chronic systolic CHF (congestive heart failure) ([place])   . Diabetes ([place])   . Hemoptysis    a. 08/2014 possibly due to pulm infarct/PE.  Marland Kitchen Hyperlipidemia   . Hypertension   . Hypotension   . Ischemic cardiomyopathy    a. echo 08/23/2014 EF <20%, dilated CM, mod MR/TR  . Leukocytosis   . Leukocytosis 09/10/2014  . Nephrolithiasis   . Pleural effusion on right 08/2014 - small  . Reactive thrombocytosis 09/10/2014  . Right leg DVT ([place])    a. 08/2014.  Marland Kitchen Ventricular tachycardia ([place])    a. 06/2014 at time of MI, s/p DCCV.    Past Surgical History: Past Surgical History:  Procedure Laterality Date  . BEDSIDE EVACUATION OF HEMATOMA  10/05/2014   Procedure: EVACUATION OF HEMATOMA;  Surgeon: Ivin Poot, MD;  Location: [facility];  Service: Open Heart Surgery;;  . CHOLECYSTECTOMY N/A 02/21/2017   Procedure: LAPAROSCOPIC CHOLECYSTECTOMY;  Surgeon: Rolm Bookbinder, MD;  Location: [facility];  Service: General;  Laterality: N/A;  . INSERTION OF IMPLANTABLE LEFT VENTRICULAR ASSIST DEVICE N/A 09/21/2014   Procedure: INSERTION OF IMPLANTABLE LEFT VENTRICULAR ASSIST DEVICE;  Surgeon: Ivin Poot, MD;  Location: [facility];  Service: Open Heart Surgery;  Laterality: N/A;  CIRC ARREST  NITRIC OXIDE  . LEFT HEART CATHETERIZATION WITH CORONARY ANGIOGRAM N/A 06/12/2014   Procedure: LEFT HEART CATHETERIZATION WITH CORONARY ANGIOGRAM;  Surgeon: Lorretta Harp, MD;  Location: [facility] CATH LAB;  Service:  Cardiovascular;  Laterality: N/A;  . RIGHT HEART CATHETERIZATION N/A 09/10/2014   Procedure: RIGHT HEART CATH;  Surgeon: [name], MD;  Location: [facility] CATH LAB;  Service: Cardiovascular;  Laterality: N/A;  . RIGHT HEART CATHETERIZATION N/A 09/17/2014   Procedure: RIGHT HEART CATH;  Surgeon: [name], MD;  Location: [facility] CATH LAB;  Service: Cardiovascular;  Laterality: N/A;  . TEE WITHOUT CARDIOVERSION N/A 09/21/2014   Procedure: TRANSESOPHAGEAL ECHOCARDIOGRAM (TEE);  Surgeon: Ivin Poot, MD;  Location: [facility];  Service: Open Heart Surgery;  Laterality: N/A;  . TEE WITHOUT CARDIOVERSION N/A 10/05/2014   Procedure: TRANSESOPHAGEAL ECHOCARDIOGRAM (TEE);  Surgeon: Collier Salina  [name], MD;  Location: [facility];  Service: Open Heart Surgery;  Laterality: N/A;  . VIDEO ASSISTED THORACOSCOPY (VATS)/THOROCOTOMY Left 10/06/2014   Procedure: VIDEO ASSISTED THORACOSCOPY (VATS)/THOROCOTOMY;  Surgeon: Ivin Poot, MD;  Location: [facility];  Service: Open Heart Surgery;  Laterality: Left;    Family History: Family History  Problem Relation Age of Onset  . Hypertension Father   . Heart attack Father   . Heart Problems Sister     Social History: Social History   Socioeconomic History  . Marital status: Married    Spouse name: Not on file  . Number of children: Not on file  . Years of education: Not on file  . Highest education level: Not on file  Social Needs  . Financial resource strain: Not on file  . Food insecurity - worry: Not on file  . Food insecurity - inability: Not on file  . Transportation needs - medical: Not on file  . Transportation needs - non-medical: Not on file  Occupational History  . Occupation: retired  Tobacco Use  . Smoking status: Former Smoker    Packs/day: 0.30    Years: 0.00    Pack years: 0.00    Types: Cigarettes    Last attempt to quit: 06/12/1969    Years since quitting: 48.2  . Smokeless tobacco: Never Used  . 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  Substance and Sexual Activity  . Alcohol use: No    Alcohol/week: 0.0 oz  . Drug use: No  . Sexual activity: Not Currently  Other Topics Concern  . Not on file  Social History Narrative  . Not on file    Allergies:  Allergies  Allergen Reactions  . Biopatch [Chlorhexidine]     Objective:    Vital Signs:   BP: ()/()  Arterial Line BP: ()/()    There were no vitals filed for this visit.  Mean arterial Pressure  Physical Exam    General:  Well appearing. No resp difficulty HEENT: Normal Neck: supple. JVP 5-6 . Carotids 2+ bilat; no bruits. No lymphadenopathy or thyromegaly appreciated. Cor: Mechanical heart sounds with LVAD hum present. Lungs: Clear Abdomen: soft, nontender, nondistended. No hepatosplenomegaly. No bruits or masses. Good bowel sounds. Driveline: C/D/I; securement device intact and driveline incorporated Extremities: no cyanosis, clubbing, rash, edema Neuro: alert & orientedx3, cranial nerves grossly intact. moves all 4 extremities w/o difficulty. Affect pleasant   Telemetry     EKG     Labs    Basic Metabolic Panel: No results for input(s): NA, K, CL, CO2, GLUCOSE, BUN, CREATININE, CALCIUM, MG, PHOS in the last 168 hours.  Liver Function Tests: No results for input(s): AST, ALT, ALKPHOS, BILITOT, PROT, ALBUMIN in the last 168 hours. No results for input(s): LIPASE, AMYLASE in the last 168 hours. No results for input(s): AMMONIA in the last 168 hours.  CBC: No results for input(s): WBC, NEUTROABS, HGB, HCT, MCV, PLT in the last 168 hours.  Cardiac Enzymes: No results for input(s): CKTOTAL, CKMB, CKMBINDEX, TROPONINI in the last 168 hours.  BNP: BNP (last 3 results) No results for input(s): BNP in the last 8760 hours.  ProBNP (last 3 results) No results for input(s): PROBNP in the last 8760 hours.   CBG: No results for input(s): GLUCAP in the last 168 hours.  Coagulation Studies: Recent Labs     08/19/17 1540  LABPROT 24.8*  INR 2.26    Other results: EKG: pending  Imaging     No results found.    Patient Profile:  [name] is a 72 year old with a history of CAD with ankylosing spondylitis, DM2, carotid stenting, ICM s/p anterior stemi with VT arrest 06/2014, PE with pulmonary infarct IVC filter 4742, chronic systolic heart failure, gallstones  S/P lap cholecystectomy, and HMII LVAD 08/2013.   Admitted with elevated LDH and concern for pump thrombosis.    Assessment/Plan:    1. LVAD HMII Complication - HMII placed 2015  LDH Results for MD, SMOLA (MRN [id number]) as of 08/19/2017 17:09  Ref. Range 07/16/2017 09:07 07/25/2017 09:05 08/02/2017 09:01 08/12/2017 09:06 08/19/2017 15:40  LDH Latest Ref Range: 98 - 192 U/L 348 (H) 357 (H)  377 (H) 780 (H)  Pharmacy consulted for Bival. Follow daily LDH. He understands if LDH does not come we may need to consider pump exchange.  INR goal 2-2.5. He has been therapeutic except had INR 1.7 on 11/26 otherwise therapeutic.  Continue current speed 9200. VAD parameters ok. No power spikes.   2. Chronic Systolic Heart Failure , ICM  Volume status stable. Continue lasix 40 mg daily and alternate with 20 mg.  No bb or arb with soft maps.   3. CAD  Cotinue statin. No aspirin due to bleeding.   4. Ankylosing Spondylitis Continue current pain regimen.   Admit to [facility]     I reviewed the LVAD parameters from today, and compared the results to the patient's prior recorded data.  No programming changes were made.  The LVAD is functioning within specified parameters.  The patient performs LVAD self-test daily.  LVAD interrogation was negative for any significant power changes, alarms or PI events/speed drops.  LVAD equipment check completed and is in good working order.  Back-up equipment present.   LVAD education done on emergency procedures and precautions and reviewed exit site care.  Length of Stay: 0  Darrick Grinder,  NP 08/19/2017, 4:49 PM  VAD Team Pager [phone] (7am - 7am) +++VAD ISSUES ONLY+++   Advanced Heart Failure Team Pager [phone] (M-F; [place])  Please contact [facility] for night-coverage after hours (4p -7a ) and weekends on [email or web address] for all non- LVAD Issues   72 y/o male ws/p LVAD being admitted for presumed pump thrombosis with markedly elevated. LDH. No evidence of HF. On exam pump sounds normal. No evidence of dark urine or power spikes. Will admit for IV bivalirudin. Follow LDH closely. Consider ramp echo to assess for LV unloading tomorrow.   VAD interrogated personally. Parameters stable.  Discussed with PharmD personally.  [name], MD  5:26 PM

## 2017-08-20 ENCOUNTER — Encounter (HOSPITAL_COMMUNITY): Payer: Self-pay

## 2017-08-20 DIAGNOSIS — R740 Nonspecific elevation of levels of transaminase and lactic acid dehydrogenase [LDH]: Secondary | ICD-10-CM

## 2017-08-20 DIAGNOSIS — R7402 Elevation of levels of lactic acid dehydrogenase (LDH): Secondary | ICD-10-CM

## 2017-08-20 LAB — BASIC METABOLIC PANEL
Anion gap: 10 (ref 5–15)
BUN: 16 mg/dL (ref 6–20)
CALCIUM: 9 mg/dL (ref 8.9–10.3)
CO2: 23 mmol/L (ref 22–32)
CREATININE: 0.98 mg/dL (ref 0.61–1.24)
Chloride: 105 mmol/L (ref 101–111)
Glucose, Bld: 99 mg/dL (ref 65–99)
Potassium: 3.9 mmol/L (ref 3.5–5.1)
SODIUM: 138 mmol/L (ref 135–145)

## 2017-08-20 LAB — CBC
HCT: 37.1 % — ABNORMAL LOW (ref 39.0–52.0)
HEMOGLOBIN: 11.5 g/dL — AB (ref 13.0–17.0)
MCH: 26.8 pg (ref 26.0–34.0)
MCHC: 31 g/dL (ref 30.0–36.0)
MCV: 86.5 fL (ref 78.0–100.0)
PLATELETS: 312 10*3/uL (ref 150–400)
RBC: 4.29 MIL/uL (ref 4.22–5.81)
RDW: 16.4 % — AB (ref 11.5–15.5)
WBC: 8.1 10*3/uL (ref 4.0–10.5)

## 2017-08-20 LAB — APTT
APTT: 77 s — AB (ref 24–36)
APTT: 73 s — AB (ref 24–36)
aPTT: 80 seconds — ABNORMAL HIGH (ref 24–36)
aPTT: 97 seconds — ABNORMAL HIGH (ref 24–36)

## 2017-08-20 LAB — PROTIME-INR
INR: 3.7
PROTHROMBIN TIME: 36.4 s — AB (ref 11.4–15.2)

## 2017-08-20 LAB — LACTATE DEHYDROGENASE: LDH: 313 U/L — AB (ref 98–192)

## 2017-08-20 MED ORDER — FUROSEMIDE 40 MG PO TABS
40.0000 mg | ORAL_TABLET | ORAL | Status: DC
  Administered 2017-08-21: 40 mg via ORAL
  Filled 2017-08-20: qty 1

## 2017-08-20 MED ORDER — SODIUM CHLORIDE 0.9 % IV SOLN
0.0600 mg/kg/h | INTRAVENOUS | Status: DC
  Filled 2017-08-20 (×2): qty 250

## 2017-08-20 MED ORDER — WARFARIN SODIUM 3 MG PO TABS
6.0000 mg | ORAL_TABLET | Freq: Once | ORAL | Status: AC
  Administered 2017-08-20: 6 mg via ORAL
  Filled 2017-08-20: qty 2

## 2017-08-20 MED ORDER — WARFARIN - PHARMACIST DOSING INPATIENT: Freq: Every day | Status: DC

## 2017-08-20 MED ORDER — FUROSEMIDE 20 MG PO TABS
20.0000 mg | ORAL_TABLET | ORAL | Status: DC
  Administered 2017-08-20: 20 mg via ORAL
  Filled 2017-08-20 (×2): qty 1

## 2017-08-20 NOTE — Progress Notes (Signed)
[place] for bivalirudin, resume Coumadin Indication: concern for pump thrombosis  Allergies  Allergen Reactions  . Biopatch [Chlorhexidine]     Patient Measurements: Height: 5' 10.5" (179.1 cm) Weight: 185 lb 10 oz (84.2 kg) IBW/kg (Calculated) : 74.15  Vital Signs: Temp: 98.2 F (36.8 C) (02/19 0726) Temp Source: Oral (02/19 0726) BP: 86/69 (02/19 0726) Pulse Rate: 67 (02/19 0726)  Labs: Recent Labs    08/19/17 1540 08/19/17 1845 08/19/17 2330 08/20/17 0251 08/20/17 0806  HGB  --   --   --  11.5*  --   HCT  --   --   --  37.1*  --   PLT  --   --   --  312  --   APTT  --   --  97* 80* 77*  LABPROT 24.8*  --   --  36.4*  --   INR 2.26  --   --  3.70  --   CREATININE  --  1.13  --  0.98  --     Estimated Creatinine Clearance: 74.7 mL/min (by C-G formula based on SCr of 0.98 mg/dL).  Assessment: 72 yo male with LVAD and possible pump thrombosis for Angiomax.  LDH with dramatic drop today.  No overt bleeding or complication noted.  INR up to 3.7 but falsely elevated from lab interference with bivalirudin infusion.  PTT currently 77.  Pharmacy asked to resume Coumadin today.  PTA dose 6 mg daily.  Goal of Therapy:  APTT 50-60 seconds per discussion with Dr. Haroldine Laws INR goal 2-2.5 Monitor platelets by anticoagulation protocol: Yes   Plan:  Decrease bivalirudin 0.08 mg/kg/hr Recheck in 8 hrs. Coumadin 6 mg x 1 tonight. Daily INR  Uvaldo Rising, BCPS  Clinical Pharmacist Pager [phone]  08/20/2017 9:39 AM

## 2017-08-20 NOTE — Progress Notes (Signed)
[place] for bivalirudin, resume Coumadin Indication: concern for pump thrombosis  Allergies  Allergen Reactions  . Biopatch [Chlorhexidine] Dermatitis and Rash    Patient Measurements: Height: 5' 10.5" (179.1 cm) Weight: 185 lb 10 oz (84.2 kg) IBW/kg (Calculated) : 74.15  Vital Signs: Temp: 98.1 F (36.7 C) (02/19 1936) Temp Source: Oral (02/19 1936) BP: 81/63 (02/19 1936) Pulse Rate: 78 (02/19 1936)  Labs: Recent Labs    08/19/17 1540 08/19/17 1845  08/20/17 0251 08/20/17 0806 08/20/17 1831  HGB  --   --   --  11.5*  --   --   HCT  --   --   --  37.1*  --   --   PLT  --   --   --  312  --   --   APTT  --   --    < > 80* 77* 73*  LABPROT 24.8*  --   --  36.4*  --   --   INR 2.26  --   --  3.70  --   --   CREATININE  --  1.13  --  0.98  --   --    < > = values in this interval not displayed.    Estimated Creatinine Clearance: 74.7 mL/min (by C-G formula based on SCr of 0.98 mg/dL).  Assessment: 72 yo male with LVAD and possible pump thrombosis for Angiomax.  LDH with dramatic drop today.  No overt bleeding or complication noted.  INR up to 3.7 but falsely elevated from lab interference with bivalirudin infusion.   PTT currently 73 sec slightly > new goal with drip in LDH and concern for bleeding.  Bivalirudin drip rate currently 0.08 mg/kg/hr.    Pharmacy asked to resume Coumadin today.  PTA dose 6 mg daily.  Goal of Therapy:  APTT 50-60 seconds per discussion with Dr. Haroldine Laws INR goal 2-2.5 Monitor platelets by anticoagulation protocol: Yes   Plan:  Decrease bivalirudin 0.06 mg/kg/hr Daily aptt, INR  Bonnita Nasuti Pharm.D. CPP, BCPS Clinical Pharmacist [phone] 08/20/2017 8:02 PM

## 2017-08-20 NOTE — Plan of Care (Signed)
Continue current care plan 

## 2017-08-20 NOTE — Progress Notes (Addendum)
[place] for bivalirudin Indication: concern for pump thrombosis  Allergies  Allergen Reactions  . Biopatch [Chlorhexidine]     Patient Measurements: Height: 5' 10.5" (179.1 cm) Weight: 181 lb 14.1 oz (82.5 kg) IBW/kg (Calculated) : 74.15  Vital Signs: Temp: 98.1 F (36.7 C) (02/18 2326) Temp Source: Oral (02/18 2326) BP: 85/74 (02/18 2326) Pulse Rate: 77 (02/18 2326)  Labs: Recent Labs    08/19/17 1540 08/19/17 1845 08/19/17 2330  APTT  --   --  97*  LABPROT 24.8*  --   --   INR 2.26  --   --   CREATININE  --  1.13  --     Estimated Creatinine Clearance: 64.8 mL/min (by C-G formula based on SCr of 1.13 mg/dL).  Assessment: 72 yo male with LVAD and possible pump thrombosis for Angiomax Goal of Therapy:  aPTT 50-85 seconds Monitor platelets by anticoagulation protocol: Yes   Plan:  Decrease bivalirudin 0.1 mg/kg/hr Follow-up am labs.   Phillis Knack, PharmD, BCPS  08/20/2017,12:16 AM   Addendum: Recheck aPTT 80 No change to Bivalirudin for now Recheck aPTT at 0800  Phillis Knack, PharmD, BCPS 08/20/2017 4:23 AM

## 2017-08-20 NOTE — Progress Notes (Signed)
.   Advanced Heart Failure VAD Team Note  PCP-Cardiologist: No primary care provider on file.   Subjective:    Admitted with elevated LDH suggestive of possible pump thrombus  Now on bivalirudin. No bleeding. Denies CP or SOB. No dark urine or HF symptoms. No power spikes  Neck and back sore  LDH back down to 313. INR 3.70   LVAD INTERROGATION:  HeartMate II LVAD:  Flow 4.1 liters/min, speed 9200, power 5.0, PI 5.7.  No power spikes   Objective:    Vital Signs:   Temp:  [98.1 F (36.7 C)-98.9 F (37.2 C)] 98.2 F (36.8 C) (02/19 0547) Pulse Rate:  [68-81] 75 (02/19 0547) Resp:  [20-26] 26 (02/19 0547) BP: (84-116)/(70-74) 92/72 (02/19 0547) SpO2:  [96 %-99 %] 99 % (02/19 0547) Weight:  [82.5 kg (181 lb 14.1 oz)-84.2 kg (185 lb 10 oz)] 84.2 kg (185 lb 10 oz) (02/19 0500) Last BM Date: 08/19/17 Mean arterial Pressure 70-80s  Intake/Output:   Intake/Output Summary (Last 24 hours) at 08/20/2017 0641 Last data filed at 08/20/2017 0022 Gross per 24 hour  Intake -  Output 400 ml  Net -400 ml     Physical Exam    General:  NAD.  HEENT: normal  Neck: supple. JVP not elevated.  Carotids 2+ bilat; no bruits. No lymphadenopathy or thryomegaly appreciated. Cor: LVAD hum.  Lungs: Clear. Abdomen: soft, nontender, non-distended. No hepatosplenomegaly. No bruits or masses. Good bowel sounds. Driveline site clean. Anchor in place.  Extremities: no cyanosis, clubbing, rash. Warm no edema  Neuro: alert & oriented x 3. No focal deficits. Moves all 4 without problem    Telemetry   NSR 70s Personally reviewed   EKG    N/a   Labs   Basic Metabolic Panel: Recent Labs  Lab 08/19/17 1845 08/20/17 0251  NA 138 138  K 4.0 3.9  CL 104 105  CO2 23 23  GLUCOSE 114* 99  BUN 18 16  CREATININE 1.13 0.98  CALCIUM 8.9 9.0    Liver Function Tests: No results for input(s): AST, ALT, ALKPHOS, BILITOT, PROT, ALBUMIN in the last 168 hours. No results for input(s): LIPASE,  AMYLASE in the last 168 hours. No results for input(s): AMMONIA in the last 168 hours.  CBC: Recent Labs  Lab 08/20/17 0251  WBC 8.1  HGB 11.5*  HCT 37.1*  MCV 86.5  PLT 312    INR: Recent Labs  Lab 08/19/17 1540 08/20/17 0251  INR 2.26 3.70    Other results:  EKG:    Imaging    No results found.   Medications:     Scheduled Medications: . aspirin EC  81 mg Oral Daily  . pantoprazole  40 mg Oral QHS  . potassium chloride SA  40 mEq Oral Daily  . rosuvastatin  10 mg Oral QHS  . traZODone  100 mg Oral QHS     Infusions: . bivalirudin (ANGIOMAX) infusion 0.5 mg/mL (Non-ACS indications) 0.1 mg/kg/hr (08/20/17 0022)     PRN Medications:  acetaminophen, cyclobenzaprine, ondansetron (ZOFRAN) IV, traMADol   Patient Profile   Scott [name] is a 72 year old with a history of CAD with ankylosing spondylitis, DM2, carotid stenting, ICM s/p anterior stemi with VT arrest 06/2014, PE with pulmonary infarct IVC filter 6160, chronic systolic heart failure, gallstones  S/P lap cholecystectomy, and HMII LVAD 08/2013.   Admitted with elevated LDH and concern for pump thrombosis.    Assessment/Plan:    1. LVAD HMII Complication -  HMII placed 2015 / elevated LDH - LDH back down to 313 today on bivalirudin. Suspect lab error yesterday - No clinical evidence of pump thrombosis - Given rising trend prior to admit will continue bival one more day. With INR 3.7 will discuss lowering dose with PharmD - no bleeding currently - VAD interrogated personally. Parameters stable.   2. Chronic Systolic Heart Failure , ICM  -Volume status looks good. No overload. . Continue lasix 40 mg daily and alternate with 20 mg.  - No bb or arb with soft maps.   3. CAD  - No s/s ischemia. Continue statin. No aspirin due to bleeding.   4. Ankylosing Spondylitis -Continue current pain regimen.  - encourage ambulation    Length of Stay: 1  [name], MD 08/20/2017, 6:41  AM  VAD Team --- VAD ISSUES ONLY--- Pager [phone] (7am - 7am)  Advanced Heart Failure Team  Pager [phone] (M-F; 7a - 4p)  Please contact [facility] for night-coverage after hours (4p -7a ) and weekends on [email or web address]

## 2017-08-20 NOTE — Progress Notes (Signed)
LVAD Coordinator Rounding Note:  Admitted 08/19/17 with acute rise in LDH, suspected pump thrombus.  Heartmate II LVAD implanted on 09/21/14 by PVT under Destination therapy criteria.  LDH is down this morning to 313 from 780 yesterday.  Vital signs: HR: 66 Doppler Pressure: 72 Automatic BP: 86/69 (76) O2 Sat: 98 RA Wt: 185lbs   LVAD interrogation reveals:   Speed: 9200 Flow: 3.8 Power: 4.8 PI: 5.4 Alarms: none Events:  4-5 daily Fixed speed: 9200 Low speed limit: 8600   Drive Line: changed weekly by patients wife, [name], on Sundays  Labs:  LDH trend: 780>313  INR trend: 2.26>3.70  Anticoagulation Plan: -INR Goal: 2-2.5. -ASA Dose: 81 mg   Gtts: Bival 0.08 mg/kg/hr  Blood Products:  none  Device: None  Pt is asking about his Lasix dose for today. Pt states that he rotates 20/40 daily. Today is a 20 mg dose. I have spoke with Darrick Grinder, NP about this issue. She will address.  Plan/Recommendations:   1. Page VAD Coordinator for any equipment issues or concerns.   [name] RN, VAD Coordinator 24/7 pager [phone]

## 2017-08-21 ENCOUNTER — Other Ambulatory Visit (HOSPITAL_COMMUNITY): Payer: Self-pay | Admitting: *Deleted

## 2017-08-21 DIAGNOSIS — Z95811 Presence of heart assist device: Secondary | ICD-10-CM

## 2017-08-21 LAB — BASIC METABOLIC PANEL
ANION GAP: 9 (ref 5–15)
BUN: 18 mg/dL (ref 6–20)
CALCIUM: 8.9 mg/dL (ref 8.9–10.3)
CO2: 24 mmol/L (ref 22–32)
CREATININE: 0.98 mg/dL (ref 0.61–1.24)
Chloride: 105 mmol/L (ref 101–111)
GFR calc non Af Amer: >60 mL/min (ref >60)
Glucose, Bld: 99 mg/dL (ref 65–99)
Potassium: 4 mmol/L (ref 3.5–5.1)
SODIUM: 138 mmol/L (ref 135–145)

## 2017-08-21 LAB — CBC
HCT: 36.5 % — ABNORMAL LOW (ref 39.0–52.0)
Hemoglobin: 11.2 g/dL — ABNORMAL LOW (ref 13.0–17.0)
MCH: 26.5 pg (ref 26.0–34.0)
MCHC: 30.7 g/dL (ref 30.0–36.0)
MCV: 86.5 fL (ref 78.0–100.0)
Platelets: 317 10*3/uL (ref 150–400)
RBC: 4.22 MIL/uL (ref 4.22–5.81)
RDW: 16.2 % — ABNORMAL HIGH (ref 11.5–15.5)
WBC: 8.5 10*3/uL (ref 4.0–10.5)

## 2017-08-21 LAB — PROTIME-INR
INR: 2.78
INR: 1.99
PROTHROMBIN TIME: 29.1 s — AB (ref 11.4–15.2)
Prothrombin Time: 22.4 seconds — ABNORMAL HIGH (ref 11.4–15.2)

## 2017-08-21 LAB — LACTATE DEHYDROGENASE: LDH: 297 U/L — AB (ref 98–192)

## 2017-08-21 LAB — APTT: APTT: 69 s — AB (ref 24–36)

## 2017-08-21 MED ORDER — WARFARIN SODIUM 6 MG PO TABS: ORAL_TABLET | ORAL | 6 refills | Status: DC

## 2017-08-21 MED ORDER — SODIUM CHLORIDE 0.9 % IV SOLN
0.0400 mg/kg/h | INTRAVENOUS | Status: DC
  Administered 2017-08-21: 0.04 mg/kg/h via INTRAVENOUS
  Filled 2017-08-21: qty 250

## 2017-08-21 NOTE — Progress Notes (Signed)
.   Advanced Heart Failure VAD Team Note  PCP-Cardiologist: No primary care provider on file.   Subjective:    Admitted with elevated LDH suggestive of possible pump thrombus  Remains on Bival. LDH 313>297. No bleeding. No SOB or power spikes. No dark urine.   Feels great. Wants to go home.   LVAD INTERROGATION:  HeartMate II LVAD:  Flow 3.7 liters/min, speed 9200, power 4.8, PI 6.6 PI 0-5 events. No power spikes   Objective:    Vital Signs:   Temp:  [97.5 F (36.4 C)-98.2 F (36.8 C)] 97.5 F (36.4 C) (02/20 0516) Pulse Rate:  [68-78] 72 (02/19 2345) Resp:  [14-21] 21 (02/19 2345) BP: (68-135)/(40-69) 135/40 (02/20 0516) SpO2:  [97 %-99 %] 97 % (02/19 2345) Weight:  [186 lb 4.6 oz (84.5 kg)] 186 lb 4.6 oz (84.5 kg) (02/20 0500) Last BM Date: 08/19/17 Mean arterial Pressure 70-80s   Intake/Output:   Intake/Output Summary (Last 24 hours) at 08/21/2017 0731 Last data filed at 08/21/2017 7824 Gross per 24 hour  Intake 567.34 ml  Output 1000 ml  Net -432.66 ml     Physical Exam    Physical Exam: GENERAL: Well appearing, male who presents to clinic today in no acute distress. HEENT: normal  NECK: Supple, JVP  .  2+ bilaterally, no bruits.  No lymphadenopathy or thyromegaly appreciated.   CARDIAC:  Mechanical heart sounds with LVAD hum present.  LUNGS:  Clear to auscultation bilaterally.  ABDOMEN:  Soft, round, nontender, positive bowel sounds x4.     LVAD exit site: well-healed and incorporated.  Dressing dry and intact.  No erythema or drainage.  Stabilization device present and accurately applied.  Driveline dressing is being changed daily per sterile technique. EXTREMITIES:  Warm and dry, no cyanosis, clubbing, rash or edema  NEUROLOGIC:  Alert and oriented x 4.  Gait steady.  No aphasia.  No dysarthria.  Affect pleasant.       Telemetry   NSR 70s personally reviewed.    EKG    N/a   Labs   Basic Metabolic Panel: Recent Labs  Lab 08/19/17 1845  08/20/17 0251 08/21/17 0231  NA 138 138 138  K 4.0 3.9 4.0  CL 104 105 105  CO2 23 23 24   GLUCOSE 114* 99 99  BUN 18 16 18   CREATININE 1.13 0.98 0.98  CALCIUM 8.9 9.0 8.9    Liver Function Tests: No results for input(s): AST, ALT, ALKPHOS, BILITOT, PROT, ALBUMIN in the last 168 hours. No results for input(s): LIPASE, AMYLASE in the last 168 hours. No results for input(s): AMMONIA in the last 168 hours.  CBC: Recent Labs  Lab 08/20/17 0251 08/21/17 0231  WBC 8.1 8.5  HGB 11.5* 11.2*  HCT 37.1* 36.5*  MCV 86.5 86.5  PLT 312 317    INR: Recent Labs  Lab 08/19/17 1540 08/20/17 0251 08/21/17 0231  INR 2.26 3.70 2.78    Other results:  EKG:    Imaging   No results found.   Medications:     Scheduled Medications: . aspirin EC  81 mg Oral Daily  . furosemide  20 mg Oral Q48H   And  . furosemide  40 mg Oral Q48H  . pantoprazole  40 mg Oral QHS  . potassium chloride SA  40 mEq Oral Daily  . rosuvastatin  10 mg Oral QHS  . traZODone  100 mg Oral QHS  . Warfarin - Pharmacist Dosing Inpatient   Does not apply [phone]  Infusions: . bivalirudin (ANGIOMAX) infusion 0.5 mg/mL (Non-ACS indications) 0.04 mg/kg/hr (08/21/17 0722)    PRN Medications: acetaminophen, cyclobenzaprine, ondansetron (ZOFRAN) IV, traMADol   Patient Profile   [name] is a 72 year old with a history of CAD with ankylosing spondylitis, DM2, carotid stenting, ICM s/p anterior stemi with VT arrest 06/2014, PE with pulmonary infarct IVC filter 3276, chronic systolic heart failure, gallstones  S/P lap cholecystectomy, and HMII LVAD 08/2013.   Admitted with elevated LDH and concern for pump thrombosis.    Assessment/Plan:    1. LVAD HMII Complication - HMII placed 2015 / elevated LDH - LDH trending down 313>297   No clinical evidence of pump thrombosis. Stop Bival  - Given rising trend prior to admit will continue bival one more day.  - INR 2.78.  -  no bleeding currently -  Vad parameters stable.   2. Chronic Systolic Heart Failure , ICM  - Continue lasix 40 mg daily and alternate with 20 mg.  - No bb or arb with soft maps.   3. CAD  - No s/s ischemia. Continue statin. No aspirin due to bleeding.   4. Ankylosing Spondylitis -Continue current pain regimen.  - encourage ambulation   Stop Bival. Recheck INR in 4 hours. Possible d/c later today.    Length of Stay: 2  Darrick Grinder, NP 08/21/2017, 7:31 AM  VAD Team --- VAD ISSUES ONLY--- Pager [phone] (7am - 7am)  Advanced Heart Failure Team  Pager [phone] (M-F; 7a - 4p)  Please contact [facility] for night-coverage after hours (4p -7a ) and weekends on [email or web address]  Patient seen and examined with Darrick Grinder, NP. We discussed all aspects of the encounter. I agree with the assessment and plan as stated above.   He is doing well LDH continues to drop on bival. No clinical evidence of pump thrombosis. Will stop bival and check INR in 4 hours. If therapeutic can go home today. VAD interrogated personally. Parameters stable.  [name], MD  9:18 AM

## 2017-08-21 NOTE — Progress Notes (Addendum)
[place] for  Coumadin Indication: concern for pump thrombosis  Allergies  Allergen Reactions  . Biopatch [Chlorhexidine] Dermatitis and Rash    Patient Measurements: Height: 5' 10.5" (179.1 cm) Weight: 186 lb 4.6 oz (84.5 kg)(weighed without controller scale B ) IBW/kg (Calculated) : 74.15  Vital Signs: Temp: 97.6 F (36.4 C) (02/20 1114) Temp Source: Oral (02/20 1114) BP: 90/76 (02/20 1114) Pulse Rate: 70 (02/20 1114)  Labs: Recent Labs    08/19/17 1845  08/20/17 0251 08/20/17 0806 08/20/17 1831 08/21/17 0231 08/21/17 1135  HGB  --   --  11.5*  --   --  11.2*  --   HCT  --   --  37.1*  --   --  36.5*  --   PLT  --   --  312  --   --  317  --   APTT  --    < > 80* 77* 73* 69*  --   LABPROT  --   --  36.4*  --   --  29.1* 22.4*  INR  --   --  3.70  --   --  2.78 1.99  CREATININE 1.13  --  0.98  --   --  0.98  --    < > = values in this interval not displayed.    Estimated Creatinine Clearance: 74.7 mL/min (by C-G formula based on SCr of 0.98 mg/dL).  Assessment: 72 yo male with LVAD and possible pump thrombosis for Angiomax.  LDH with dramatic drop today.  No overt bleeding or complication noted.  INR up to 3.7 but falsely elevated from lab interference with bivalirudin infusion.   PTT currently 73 sec slightly > new goal with drip in LDH and concern for bleeding.  Bivalirudin drip rate currently 0.08 mg/kg/hr.    Pharmacy asked to resume Coumadin 2/19.  PTA dose 6 mg daily.  Coumadin dose held 2/18 due to possibility of procedures needed.  AM INR 2.78 while on bivalirudin.  Bivalirudin stopped today ~ 8 AM.  INR close to 4 hrs later down to 1.99.  LDH continues to trend down.  Goal of Therapy:  INR goal 2-2.5 Monitor platelets by anticoagulation protocol: Yes   Plan:  Continue Coumadin 9 mg x 1 tonight, then resume 6 mg daily. Recheck INR Friday as outpatient.  Uvaldo Rising, BCPS  Clinical Pharmacist Pager  [phone]  08/21/2017 12:35 PM

## 2017-08-21 NOTE — Progress Notes (Signed)
LVAD Coordinator Rounding Note:  Admitted 08/19/17 with acute rise in LDH, suspected pump thrombus.  Heartmate II LVAD implanted on 09/21/14 by PVT under Destination therapy criteria.  LDH down yesterday morning to 313 from 780; continues to drift down today. Pt has no HF symptoms, urine is clear, and no power elevations noted in VAD parameter history.  Vital signs: HR: 72 Doppler Pressure: 80 Automatic BP:  135/40 (70) O2 Sat: 97 RA Wt: 185>186 lbs   LVAD interrogation reveals:   Speed: 9200 Flow: --- to 3.8 Power: 4.8 PI:  6.2 Alarms: none Events:  4-5 daily Fixed speed: 9200 Low speed limit: 8600   Drive Line: changed weekly by patients wife, [name], on Sundays  Labs:  LDH trend: 780>313>297  INR trend: 2.26>3.70>2.78  Anticoagulation Plan: -INR Goal: 2-2.5. -ASA Dose: 81 mg   Gtts: Bival 0.08 mg/kg/hr - off at 7:30 this am  Blood Products:  none  Device: None  Significant events on LVAD:  - 10/05/14>>left hemothorax with tamponade post LVAD implant. Re-do sternotomy with evacuation hematoma - 10/06/14>>left thoracotomy with evacuation of hematoma and re-exploration for bleeding - 03/30/15>>developed significant contact dermatitis around drive line exit site; dermatology consulted. Clean                           site with saline only, S[place]av[place] dressing only with no biopatch - 03/30/15>> IVC filter removed (pre VAD PE, IVC filter no longer needed since anticoagulated) - 07/23/15>>epistaxis requiring ED visits - 07/27/15>>epistaxis requiring ED visits - 02/21/17>>lap chole   Plan/Recommendations:  1. Stop Bival gtt and re-check INR in 4 hours per Darrick Grinder, NP. Possible discharge home today. 2. Discharge INR either Friday or Monday based on post BiVal INR.  3. Page VAD Coordinator for any equipment issues or concerns.   Zada Girt RN, VAD Coordinator 24/7 pager [phone]

## 2017-08-21 NOTE — Discharge Summary (Addendum)
Advanced Heart Failure Team  Discharge Summary   Patient ID: Scott [name] MRN: [id number], DOB/AGE: 1946-02-22 72 y.o. Admit date: 08/19/2017 D/C date:     08/21/2017   Primary Discharge Diagnoses:  1. LVAD HMII Complication - HMII placed 2015 / elevated LDH concerning for pump thrombosis LDH [phone] On coumadin and aspirin 2. Chronic Systolic HF, ICM 3. CAD 4. Ankylosing Spondylitis   Hospital Course:  Scott [name] is a 72 year old with a history of CAD with ankylosing spondylitis, DM2, carotid stenting, ICM s/p anterior stemi with VT arrest 06/2014, PE with pulmonary infarct IVC filter 7510, chronic systolic heart failure, gallstones S/P lap cholecystectomy, and HMII LVAD 08/2013.   Admitted with elevated LDH and concern for pump thrombosis. Admitted from [place] clinic with elevated LDH 780. He had no HF symptoms, INR had been therapeutic, and VAD parameters were stable. He was started on bivalirudin. LDH went down [phone]. Bivalirudin was stopped with INR check 4 hours later. INR was stable at  2.   From HF perspective he remained stable. INR on the day of discharge was 2. Plan to repeat INR in a few day. He will  continue to be followed closely in the VAD clinic.    LVAD INTERROGATION:  HeartMate II LVAD:  Flow 3.7 liters/min, speed 9200, power 4.8, PI 6.6 PI 0-5 events. No power spikes   Discharge Weight: 186 pounds  Discharge Vitals: Blood pressure 90/76, pulse 70, temperature 97.6 F (36.4 C), temperature source Oral, resp. rate 16, height 5' 10.5" (1.791 m), weight 186 lb 4.6 oz (84.5 kg), SpO2 95 %.  Labs: Lab Results  Component Value Date   WBC 8.5 08/21/2017   HGB 11.2 (L) 08/21/2017   HCT 36.5 (L) 08/21/2017   MCV 86.5 08/21/2017   PLT 317 08/21/2017    Recent Labs  Lab 08/21/17 0231  NA 138  K 4.0  CL 105  CO2 24  BUN 18  CREATININE 0.98  CALCIUM 8.9  GLUCOSE 99   Lab Results  Component Value Date   CHOL 143 07/09/2017   HDL 46 07/09/2017    LDLCALC 88 07/09/2017   TRIG 46 07/09/2017   BNP (last 3 results) No results for input(s): BNP in the last 8760 hours.  ProBNP (last 3 results) No results for input(s): PROBNP in the last 8760 hours.   Diagnostic Studies/Procedures   No results found.  Discharge Medications   Allergies as of 08/21/2017      Reactions   Biopatch [chlorhexidine] Dermatitis, Rash      Medication List    TAKE these medications   acetaminophen 500 MG tablet Commonly known as:  TYLENOL Take 1,000 mg by mouth every 6 (six) hours as needed for moderate pain.   aspirin EC 81 MG tablet Take 1 tablet (81 mg total) by mouth daily.   clobetasol cream 0.05 % Commonly known as:  TEMOVATE Apply 1 application topically 2 (two) times daily as needed (rash).   cyclobenzaprine 5 MG tablet Commonly known as:  FLEXERIL Take 1 tablet (5 mg total) by mouth 3 (three) times daily as needed for muscle spasms.   furosemide 20 MG tablet Commonly known as:  LASIX Every other day alternate 20 mg (1 pill) with 40 mg (2 pills)   pantoprazole 40 MG tablet Commonly known as:  PROTONIX Take 1 tablet (40 mg total) by mouth at bedtime.   potassium chloride SA 20 MEQ tablet Commonly known as:  K-DUR,KLOR-CON Every other day alternate 20  mEq (1 tablet) with 40 mEq (2 tablets)   rosuvastatin 10 MG tablet Commonly known as:  CRESTOR Take 1 tablet (10 mg total) by mouth at bedtime.   sildenafil 100 MG tablet Commonly known as:  VIAGRA Take 1 tablet (100 mg total) by mouth daily as needed for erectile dysfunction. What changed:  how much to take   traMADol 50 MG tablet Commonly known as:  ULTRAM Take 1 tablet (50 mg total) by mouth every 12 (twelve) hours as needed for moderate pain.   traZODone 100 MG tablet Commonly known as:  DESYREL Take 1 tablet (100 mg total) by mouth at bedtime.   warfarin 6 MG tablet Commonly known as:  COUMADIN Take as directed. If you are unsure how to take this medication, talk  to your nurse or doctor. Original instructions:  Take 9 mg 2/20 then resume 6 mg daily What changed:    how much to take  how to take this  when to take this  additional instructions       Disposition   The patient will be discharged in stable condition to home. Discharge Instructions    Diet - low sodium heart healthy   Complete by:  As directed    Increase activity slowly   Complete by:  As directed    Page VAD Coordinator at [phone]  Notify for: any VAD alarms, sustained elevations of power >10 watts, sustained drop in Pulse Index <3   Complete by:  As directed    Notify for:   any VAD alarms sustained elevations of power >10 watts sustained drop in Pulse Index <3     Speed Settings:   Complete by:  As directed    Fixed 9200 RPM Low 8600 RPM     Follow-up Information    Bensimhon, Shaune Pascal, MD Follow up on 08/23/2017.   Specialty:  Cardiology Why:  at 1000 INR check  Contact information: [place] [address] [phone]             Duration of Discharge Encounter: Greater than 35 minutes   Signed, Darrick Grinder  08/21/2017, 12:47 PM   Patient seen and examined with Darrick Grinder, NP. We discussed all aspects of the encounter. I agree with the assessment and plan as stated above.   LDH improved with bival infusion. No other s/s of pump thrombosis. INR therapeutic. Can go home today with close f/u in VAD clinic. VAD interrogated personally. Parameters stable.  [name] MD

## 2017-08-23 ENCOUNTER — Ambulatory Visit (HOSPITAL_COMMUNITY): Payer: Self-pay | Admitting: Pharmacist

## 2017-08-23 ENCOUNTER — Ambulatory Visit (HOSPITAL_COMMUNITY)
Admission: RE | Admit: 2017-08-23 | Discharge: 2017-08-23 | Disposition: A | Discharge status: Home / Self Care | Payer: Medicare Other | Source: Ambulatory Visit | Attending: Cardiology | Admitting: Cardiology

## 2017-08-23 ENCOUNTER — Other Ambulatory Visit (HOSPITAL_COMMUNITY): Payer: Self-pay | Admitting: Unknown Physician Specialty

## 2017-08-23 DIAGNOSIS — Z95811 Presence of heart assist device: Secondary | ICD-10-CM

## 2017-08-23 DIAGNOSIS — Z7901 Long term (current) use of anticoagulants: Secondary | ICD-10-CM

## 2017-08-23 DIAGNOSIS — Z5181 Encounter for therapeutic drug level monitoring: Secondary | ICD-10-CM

## 2017-08-23 LAB — PROTIME-INR
INR: 2.06
PROTHROMBIN TIME: 23.1 s — AB (ref 11.4–15.2)

## 2017-08-23 LAB — LACTATE DEHYDROGENASE: LDH: 311 U/L — AB (ref 98–192)

## 2017-08-29 ENCOUNTER — Other Ambulatory Visit (HOSPITAL_COMMUNITY): Payer: Medicare Other

## 2017-08-30 ENCOUNTER — Ambulatory Visit (HOSPITAL_COMMUNITY): Payer: Self-pay | Admitting: Pharmacist

## 2017-08-30 ENCOUNTER — Ambulatory Visit (HOSPITAL_COMMUNITY)
Admission: RE | Admit: 2017-08-30 | Discharge: 2017-08-30 | Disposition: A | Discharge status: Home / Self Care | Payer: Medicare Other | Source: Ambulatory Visit | Attending: Internal Medicine | Admitting: Internal Medicine

## 2017-08-30 DIAGNOSIS — I5022 Chronic systolic (congestive) heart failure: Secondary | ICD-10-CM | POA: Diagnosis not present

## 2017-08-30 DIAGNOSIS — Z7901 Long term (current) use of anticoagulants: Secondary | ICD-10-CM | POA: Diagnosis not present

## 2017-08-30 DIAGNOSIS — Z95811 Presence of heart assist device: Secondary | ICD-10-CM | POA: Diagnosis not present

## 2017-08-30 DIAGNOSIS — Z5181 Encounter for therapeutic drug level monitoring: Secondary | ICD-10-CM

## 2017-08-30 LAB — PROTIME-INR
INR: 2.58
Prothrombin Time: 27.5 seconds — ABNORMAL HIGH (ref 11.4–15.2)

## 2017-08-30 LAB — LACTATE DEHYDROGENASE: LDH: 319 U/L — AB (ref 98–192)

## 2017-09-09 ENCOUNTER — Other Ambulatory Visit (HOSPITAL_COMMUNITY): Payer: Self-pay | Admitting: Unknown Physician Specialty

## 2017-09-09 DIAGNOSIS — Z7901 Long term (current) use of anticoagulants: Secondary | ICD-10-CM

## 2017-09-09 DIAGNOSIS — Z95811 Presence of heart assist device: Secondary | ICD-10-CM

## 2017-09-10 ENCOUNTER — Encounter (HOSPITAL_COMMUNITY): Payer: Self-pay

## 2017-09-10 ENCOUNTER — Ambulatory Visit (HOSPITAL_COMMUNITY)
Admission: RE | Admit: 2017-09-10 | Discharge: 2017-09-10 | Disposition: A | Discharge status: Home / Self Care | Payer: Medicare Other | Source: Ambulatory Visit | Attending: Cardiology | Admitting: Cardiology

## 2017-09-10 ENCOUNTER — Ambulatory Visit (HOSPITAL_COMMUNITY): Payer: Self-pay | Admitting: Pharmacist

## 2017-09-10 VITALS — BP 96/0 | HR 104 | Ht 68.0 in | Wt 196.2 lb

## 2017-09-10 DIAGNOSIS — Z7901 Long term (current) use of anticoagulants: Secondary | ICD-10-CM | POA: Diagnosis not present

## 2017-09-10 DIAGNOSIS — Z87891 Personal history of nicotine dependence: Secondary | ICD-10-CM | POA: Insufficient documentation

## 2017-09-10 DIAGNOSIS — I5022 Chronic systolic (congestive) heart failure: Secondary | ICD-10-CM | POA: Diagnosis not present

## 2017-09-10 DIAGNOSIS — M452 Ankylosing spondylitis of cervical region: Secondary | ICD-10-CM

## 2017-09-10 DIAGNOSIS — Z7982 Long term (current) use of aspirin: Secondary | ICD-10-CM | POA: Diagnosis not present

## 2017-09-10 DIAGNOSIS — I252 Old myocardial infarction: Secondary | ICD-10-CM | POA: Diagnosis not present

## 2017-09-10 DIAGNOSIS — Z95811 Presence of heart assist device: Secondary | ICD-10-CM

## 2017-09-10 DIAGNOSIS — I10 Essential (primary) hypertension: Secondary | ICD-10-CM | POA: Diagnosis not present

## 2017-09-10 DIAGNOSIS — Z79899 Other long term (current) drug therapy: Secondary | ICD-10-CM | POA: Insufficient documentation

## 2017-09-10 DIAGNOSIS — E785 Hyperlipidemia, unspecified: Secondary | ICD-10-CM | POA: Insufficient documentation

## 2017-09-10 DIAGNOSIS — Z5181 Encounter for therapeutic drug level monitoring: Secondary | ICD-10-CM

## 2017-09-10 DIAGNOSIS — Z86711 Personal history of pulmonary embolism: Secondary | ICD-10-CM | POA: Insufficient documentation

## 2017-09-10 DIAGNOSIS — E119 Type 2 diabetes mellitus without complications: Secondary | ICD-10-CM | POA: Insufficient documentation

## 2017-09-10 DIAGNOSIS — I2510 Atherosclerotic heart disease of native coronary artery without angina pectoris: Secondary | ICD-10-CM | POA: Diagnosis not present

## 2017-09-10 DIAGNOSIS — I110 Hypertensive heart disease with heart failure: Secondary | ICD-10-CM | POA: Diagnosis not present

## 2017-09-10 DIAGNOSIS — Z86718 Personal history of other venous thrombosis and embolism: Secondary | ICD-10-CM | POA: Insufficient documentation

## 2017-09-10 LAB — COMPREHENSIVE METABOLIC PANEL
ALK PHOS: 108 U/L (ref 38–126)
ALT: 19 U/L (ref 17–63)
AST: 31 U/L (ref 15–41)
Albumin: 4 g/dL (ref 3.5–5.0)
Anion gap: 7 (ref 5–15)
BUN: 14 mg/dL (ref 6–20)
CALCIUM: 9.3 mg/dL (ref 8.9–10.3)
CO2: 26 mmol/L (ref 22–32)
Chloride: 104 mmol/L (ref 101–111)
Creatinine, Ser: 1.15 mg/dL (ref 0.61–1.24)
Glucose, Bld: 132 mg/dL — ABNORMAL HIGH (ref 65–99)
Potassium: 4.1 mmol/L (ref 3.5–5.1)
Sodium: 137 mmol/L (ref 135–145)
Total Bilirubin: 0.5 mg/dL (ref 0.3–1.2)
Total Protein: 8.2 g/dL — ABNORMAL HIGH (ref 6.5–8.1)

## 2017-09-10 LAB — CBC
HCT: 39.6 % (ref 39.0–52.0)
Hemoglobin: 12.3 g/dL — ABNORMAL LOW (ref 13.0–17.0)
MCH: 26.9 pg (ref 26.0–34.0)
MCHC: 31.1 g/dL (ref 30.0–36.0)
MCV: 86.5 fL (ref 78.0–100.0)
PLATELETS: 365 10*3/uL (ref 150–400)
RBC: 4.58 MIL/uL (ref 4.22–5.81)
RDW: 16.3 % — ABNORMAL HIGH (ref 11.5–15.5)
WBC: 8.1 10*3/uL (ref 4.0–10.5)

## 2017-09-10 LAB — LACTATE DEHYDROGENASE: LDH: 336 U/L — AB (ref 98–192)

## 2017-09-10 LAB — PROTIME-INR
INR: 2.88
PROTHROMBIN TIME: 30 s — AB (ref 11.4–15.2)

## 2017-09-10 NOTE — Progress Notes (Signed)
Patient ID: Scott [name], male   DOB: October 04, 1945, 72 y.o.   MRN: [id number]   VAD CLINIC NOTE  Primary Cardiologist: Dr Haroldine Laws INR : [facility] [place]  [name] is a 72 y/o with h/o CAD with ankylosing spondylitis, DM2, carotid stenting, severe ischemic CM s/p anterior STEM with VT arrest in 12/15, large PE with pulmonary infarct in 2/15 s/p IVC filter and systolic HF with EF 35%.  He underwent HM II LVAD placement on 09/20/13. He had a prolonged hospital course due to recurrent chest bleeding, severe CO2 retention and RV failure.  In 8/18 admitted with acute gallstone pancreatitis. Underwent lap chole 02/21/17.   Admitted 2/18 with elevated LDH. He was started on bivalirudin. LDH went down [phone]. Bivalirudin was stopped with INR check 4 hours later. INR was stable at  2.  Today he returns for post hospital follow up. Overall feeling fine. Denies SOB/PND/Orthopnea. Mild dyspnea with steps. Appetite ok. No fever or chills. Weight at home has been stable. No BRBPR.  Taking all medications  VAD Indication: Destination Therapy- patient choice    VAD interrogation & Equipment Management:  VAD interrogation & Equipment Management: Speed:9200 Flow: 5.6 Power:5.7 w PI:5.7  Alarms: no clinical alarms Events: 3-5 PI events per day  Fixed speed 9200 Low speed limit: 8600  Primary Controller: Replace back up battery in 17 months.  Annual Equipment Maintenance on UBC/PM was performed on 08/2017.   LVAD interrogation negative for power spikes and speed drops/PI events. No alarms recorded on event log and none reported by patient/caregiver. Back up equipment is present. No adjustments were made to prescribed settings. Pt/caregiver deny any alarms or VAD equipment issues.   LVAD equipment check completed and is in good working order. Back-up equipment present. LVAD education done on emergency procedures and precautions and reviewed exit site care.   SH:  Social History    Socioeconomic History  . Marital status: Married    Spouse name: Not on file  . Number of children: Not on file  . Years of education: Not on file  . Highest education level: Not on file  Social Needs  . Financial resource strain: Not on file  . Food insecurity - worry: Not on file  . Food insecurity - inability: Not on file  . Transportation needs - medical: Not on file  . Transportation needs - non-medical: Not on file  Occupational History  . Occupation: retired  Tobacco Use  . Smoking status: Former Smoker    Packs/day: 0.30    Years: 0.00    Pack years: 0.00    Types: Cigarettes    Last attempt to quit: 06/12/1969    Years since quitting: 48.2  . Smokeless tobacco: Never Used  . 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  Substance and Sexual Activity  . Alcohol use: No    Alcohol/week: 0.0 oz  . Drug use: No  . Sexual activity: Not Currently  Other Topics Concern  . Not on file  Social History Narrative  . Not on file    FH:  Family History  Problem Relation Age of Onset  . Hypertension Father   . Heart attack Father   . Heart Problems Sister     Past Medical History:  Diagnosis Date  . Acute on chronic respiratory failure ([place])    a. 08/2014 in setting of PE.  Marland Kitchen Ankylosing spondylitis ([place])   . Bilateral pulmonary embolism ([place])    a. 08/2014 -  started on Coumadin. Retrievable IVC filter placed 08/27/14 due to RV strain and large clot burden.  Marland Kitchen CAD (coronary artery disease)    a. stenting of LCx 2013; b. STEMI 06/12/14 s/p PCI to LAD complicated by post cath shock requiring IABP; VT s/p DCCV, EF 20%; c. NSTEMI 06/26/14 treated medically.  . Carotid artery disease ([place])    a. s/p stenting.  . Chronic systolic CHF (congestive heart failure) ([place])   . Diabetes ([place])   . Hemoptysis    a. 08/2014 possibly due to pulm infarct/PE.  Marland Kitchen Hyperlipidemia   . Hypertension   . Hypotension   . Ischemic cardiomyopathy    a. echo 08/23/2014 EF  <20%, dilated CM, mod MR/TR  . Leukocytosis   . Leukocytosis 09/10/2014  . Nephrolithiasis   . Pleural effusion on right 08/2014 - small  . Reactive thrombocytosis 09/10/2014  . Right leg DVT ([place])    a. 08/2014.  Marland Kitchen Ventricular tachycardia ([place])    a. 06/2014 at time of MI, s/p DCCV.   Current Outpatient Medications  Medication Sig Dispense Refill  . acetaminophen (TYLENOL) 500 MG tablet Take 1,000 mg by mouth every 6 (six) hours as needed for moderate pain.     Marland Kitchen aspirin EC 81 MG tablet Take 1 tablet (81 mg total) by mouth daily. 90 tablet 3  . clobetasol cream (TEMOVATE) 1.51 % Apply 1 application topically 2 (two) times daily as needed (rash).     . cyclobenzaprine (FLEXERIL) 5 MG tablet Take 1 tablet (5 mg total) by mouth 3 (three) times daily as needed for muscle spasms. 270 tablet 3  . furosemide (LASIX) 20 MG tablet Every other day alternate 20 mg (1 pill) with 40 mg (2 pills) 135 tablet 3  . pantoprazole (PROTONIX) 40 MG tablet Take 1 tablet (40 mg total) by mouth at bedtime. 90 tablet 3  . potassium chloride SA (K-DUR,KLOR-CON) 20 MEQ tablet Every other day alternate 20 mEq (1 tablet) with 40 mEq (2 tablets) 135 tablet 3  . rosuvastatin (CRESTOR) 10 MG tablet Take 1 tablet (10 mg total) by mouth at bedtime. 90 tablet 3  . sildenafil (VIAGRA) 100 MG tablet Take 1 tablet (100 mg total) by mouth daily as needed for erectile dysfunction. (Patient taking differently: Take 50 mg by mouth daily as needed for erectile dysfunction. ) 10 tablet 3  . traMADol (ULTRAM) 50 MG tablet Take 1 tablet (50 mg total) by mouth every 12 (twelve) hours as needed for moderate pain. 60 tablet 1  . traZODone (DESYREL) 100 MG tablet Take 1 tablet (100 mg total) by mouth at bedtime. 90 tablet 6  . warfarin (COUMADIN) 6 MG tablet Take 1 tablet (6 mg) daily except 1 and 1/2 tablets (9 mg) on Friday.     No current facility-administered medications for this encounter.    Vital Signs:  Vitals:   09/10/17 0913  09/10/17 0914  BP: 98/81 (!) 96/0  Pulse: (!) 104   SpO2: 98%     Wt Readings from Last 3 Encounters:  09/10/17 196 lb 3.2 oz (89 kg)  08/21/17 186 lb 4.6 oz (84.5 kg)  07/09/17 195 lb 6.4 oz (88.6 kg)   PHYSICAL EXAM: Physical Exam: GENERAL: Well appearing, male who presents to clinic today in no acute distress.  HEENT: normal  NECK: Supple, JVP 5-6  .  2+ bilaterally, no bruits.  No lymphadenopathy or thyromegaly appreciated.   CARDIAC:  Mechanical heart sounds with LVAD hum present.  LUNGS:  Clear  to auscultation bilaterally.  ABDOMEN:  Soft, round, nontender, positive bowel sounds x4.     LVAD exit site: well-healed and incorporated.  Dressing dry and intact.  No erythema or drainage.  Stabilization device present and accurately applied.  Driveline dressing is being changed daily per sterile technique. EXTREMITIES:  Warm and dry, no cyanosis, clubbing, rash or edema  NEUROLOGIC:  Alert and oriented x 4.  Gait steady.  No aphasia.  No dysarthria.  Affect pleasant.      ASSESSMENT & PLAN: 1. Chronic systolic HF: Ischemic cardiomyopathy, EF 20% s/p HMII LVAD 09/20/13.   - NYHA II with VAD support. Functionally doing well. He refuses transplant evaluation.  - Volume status stable. Continue current regimen.   2. CAD s/p Anterior STEMI on 06/12/14 with stenting of LAD:  - No s/s ischemia    - Continue statin. Off ASA due to bleeding.  3. VAD:  Had admit in February for elevated LDH. Todays LDH is stable. Pump parameters stable.  Off asa. On coumadin.  -Results for JAKARIUS, FLAMENCO (MRN [id number]) as of 09/10/2017 17:21  Ref. Range 08/20/2017 02:51 08/20/2017 08:06 08/20/2017 18:31 08/21/2017 02:31 08/21/2017 11:35 08/23/2017 09:29 08/30/2017 09:27 09/10/2017 08:57  LDH Latest Ref Range: 98 - 192 U/L 313 (H)   297 (H)  311 (H) 319 (H) 336 (H)   4. Ankylosing spondylitis: Off NSAIDS. -  Follows with rheumatology. Uses tramadol. Avoids NSAIDs as much as possible. Pain has been  controlled.  5. Gallstone pancreatitis - s/p lap chole 02/21/17. Resolved. . 6. Anticoagulation: - on coumadin. INR stable.  7. HTN:  -stable continue current regimen.  8. Erectile dysfunction  - Has PRN sildenafil  Greater than 50% of the (total minutes 25) visit spent in counseling/coordination of care regarding the above.   Darrick Grinder, NP  09/10/2017

## 2017-09-10 NOTE — Progress Notes (Signed)
Patient presents for 1 month  follow up in [facility] today. Reports no problems with VAD equipment or concerns with drive line.  Vital Signs:  Doppler Pressure 96    Automatc BP: 98/81 (90) HR:104  SPO2:98  %  Weight: 196.2 lb w/o eqt Last weight: 195.4 lb Home weights: 190-195 lbs   VAD Indication: Destination Therapy patient choice    VAD interrogation & Equipment Management: Speed:9200 Flow: 5.6 Power:5.7 w    PI:5.7  Alarms: no clinical alarms Events: 3-5 PI events per day  Fixed speed 9200 Low speed limit: 8600  Primary Controller:  Replace back up battery in 17 months. .  Annual Equipment Maintenance on UBC/PM was performed on 08/2017.   I reviewed the LVAD parameters from today and compared the results to the patient's prior recorded data. LVAD interrogation was NEGATIVE for significant power changes, NEGATIVE for clinical alarms and STABLE for PI events/speed drops. No programming changes were made and pump is functioning within specified parameters. Pt is performing daily controller and system monitor self tests along with completing weekly and monthly maintenance for LVAD equipment.  LVAD equipment check completed and is in good working order. Back-up equipment present. Charged back up battery and performed self-test on equipment.   Exit Site Care: Drive line is being maintained weekly  by his wife, [name]. Drive line exit site well healed and incorporated. The velour is fully implanted at exit site. Dressing dry and intact. No erythema or drainage. Stabilization device present and accurately applied. Pt denies fever or chills. Pt states they have adequate dressing supplies at home. 15 Sorbaview dressings and 9 anchors were given to the pt today.   Significant Events on VAD Support:  02/21/17> lap chole  Device:none  3 year Intermacs follow up completed including:  Quality of Life, KCCQ-12. Pt unable to complete Neurocognitive trail making due to vision  impairments.   Pt completed 1200 feet during 6 minute walk.  Back up controller:  11V backup battery charged during this visit.    BP & Labs:  MAP 90 - Doppler is reflecting modified systolic  Hgb 76.8 - No S/S of bleeding. Specifically denies melena/BRBPR or nosebleeds.  LDH is slowly rising at 336 with established baseline of 250- 320. Denies tea-colored urine. No power elevations noted on interrogation. We will continue to check this weekly with his INR.  Plan: 1. RTC in 1 week for inr/ldh 2. RTC in 2 months.   [name] RN [place] Coordinator   Office: [phone] 24/7 Emergency VAD Pager: [phone]

## 2017-09-12 DIAGNOSIS — H26499 Other secondary cataract, unspecified eye: Secondary | ICD-10-CM | POA: Diagnosis not present

## 2017-09-13 ENCOUNTER — Other Ambulatory Visit (HOSPITAL_COMMUNITY): Payer: Self-pay | Admitting: Unknown Physician Specialty

## 2017-09-13 DIAGNOSIS — Z95811 Presence of heart assist device: Secondary | ICD-10-CM

## 2017-09-13 DIAGNOSIS — Z7901 Long term (current) use of anticoagulants: Secondary | ICD-10-CM

## 2017-09-18 ENCOUNTER — Ambulatory Visit (HOSPITAL_COMMUNITY): Payer: Self-pay | Admitting: Pharmacist

## 2017-09-18 ENCOUNTER — Ambulatory Visit (HOSPITAL_COMMUNITY)
Admission: RE | Admit: 2017-09-18 | Discharge: 2017-09-18 | Disposition: A | Discharge status: Home / Self Care | Payer: Medicare Other | Source: Ambulatory Visit | Attending: Cardiology | Admitting: Cardiology

## 2017-09-18 DIAGNOSIS — I5023 Acute on chronic systolic (congestive) heart failure: Secondary | ICD-10-CM | POA: Diagnosis not present

## 2017-09-18 DIAGNOSIS — Z95811 Presence of heart assist device: Secondary | ICD-10-CM | POA: Diagnosis not present

## 2017-09-18 DIAGNOSIS — Z48812 Encounter for surgical aftercare following surgery on the circulatory system: Secondary | ICD-10-CM | POA: Insufficient documentation

## 2017-09-18 DIAGNOSIS — Z7901 Long term (current) use of anticoagulants: Secondary | ICD-10-CM | POA: Diagnosis not present

## 2017-09-18 DIAGNOSIS — Z5181 Encounter for therapeutic drug level monitoring: Secondary | ICD-10-CM

## 2017-09-18 LAB — LACTATE DEHYDROGENASE: LDH: 344 U/L — AB (ref 98–192)

## 2017-09-18 LAB — PROTIME-INR
INR: 2.85
PROTHROMBIN TIME: 29.7 s — AB (ref 11.4–15.2)

## 2017-09-18 NOTE — Addendum Note (Signed)
Encounter addended by: Lezlie Octave, RN on: 09/18/2017 9:47 AM  Actions taken: Visit diagnoses modified, Order list changed, Diagnosis association updated

## 2017-09-26 ENCOUNTER — Ambulatory Visit (HOSPITAL_COMMUNITY)
Admission: RE | Admit: 2017-09-26 | Discharge: 2017-09-26 | Disposition: A | Discharge status: Home / Self Care | Payer: Medicare Other | Source: Ambulatory Visit | Attending: Cardiology | Admitting: Cardiology

## 2017-09-26 ENCOUNTER — Ambulatory Visit (HOSPITAL_COMMUNITY): Payer: Self-pay | Admitting: Pharmacist

## 2017-09-26 DIAGNOSIS — Z5181 Encounter for therapeutic drug level monitoring: Secondary | ICD-10-CM | POA: Insufficient documentation

## 2017-09-26 DIAGNOSIS — Z95811 Presence of heart assist device: Secondary | ICD-10-CM

## 2017-09-26 LAB — PROTIME-INR
INR: 2.56
PROTHROMBIN TIME: 27.3 s — AB (ref 11.4–15.2)

## 2017-10-04 ENCOUNTER — Ambulatory Visit (HOSPITAL_COMMUNITY): Payer: Self-pay | Admitting: Pharmacist

## 2017-10-04 ENCOUNTER — Ambulatory Visit (HOSPITAL_COMMUNITY)
Admission: RE | Admit: 2017-10-04 | Discharge: 2017-10-04 | Disposition: A | Discharge status: Home / Self Care | Payer: Medicare Other | Source: Ambulatory Visit | Attending: Cardiology | Admitting: Cardiology

## 2017-10-04 ENCOUNTER — Other Ambulatory Visit (HOSPITAL_COMMUNITY): Payer: Self-pay | Admitting: *Deleted

## 2017-10-04 ENCOUNTER — Telehealth (HOSPITAL_COMMUNITY): Payer: Self-pay | Admitting: Pharmacist

## 2017-10-04 DIAGNOSIS — Z5181 Encounter for therapeutic drug level monitoring: Secondary | ICD-10-CM

## 2017-10-04 DIAGNOSIS — Z95811 Presence of heart assist device: Secondary | ICD-10-CM | POA: Diagnosis not present

## 2017-10-04 DIAGNOSIS — Z7901 Long term (current) use of anticoagulants: Secondary | ICD-10-CM | POA: Diagnosis not present

## 2017-10-04 DIAGNOSIS — Z961 Presence of intraocular lens: Secondary | ICD-10-CM | POA: Diagnosis not present

## 2017-10-04 DIAGNOSIS — H26491 Other secondary cataract, right eye: Secondary | ICD-10-CM | POA: Diagnosis not present

## 2017-10-04 DIAGNOSIS — H179 Unspecified corneal scar and opacity: Secondary | ICD-10-CM | POA: Diagnosis not present

## 2017-10-04 DIAGNOSIS — H26493 Other secondary cataract, bilateral: Secondary | ICD-10-CM | POA: Diagnosis not present

## 2017-10-04 DIAGNOSIS — I708 Atherosclerosis of other arteries: Secondary | ICD-10-CM | POA: Diagnosis not present

## 2017-10-04 LAB — PROTIME-INR
INR: 2.85
PROTHROMBIN TIME: 29.7 s — AB (ref 11.4–15.2)

## 2017-10-04 LAB — LACTATE DEHYDROGENASE: LDH: 365 U/L — AB (ref 98–192)

## 2017-10-04 MED ORDER — CLOPIDOGREL BISULFATE 75 MG PO TABS: 75.0000 mg | ORAL_TABLET | Freq: Every day | ORAL | 3 refills | Status: DC

## 2017-10-04 MED ORDER — CLOPIDOGREL BISULFATE 75 MG PO TABS: 75.0000 mg | ORAL_TABLET | Freq: Every day | ORAL | 0 refills | Status: DC

## 2017-10-04 NOTE — Telephone Encounter (Signed)
After discussion with Dr. Haroldine Laws, with slowly rising LDH, will add clopidogrel 75 mg daily. Discussed with Mr. And Mrs. Wieck who verified understanding.   Ruta Hinds. Velva Harman, PharmD, BCPS, CPP Clinical Pharmacist Phone: [phone] 10/04/2017 12:34 PM

## 2017-10-14 ENCOUNTER — Ambulatory Visit (HOSPITAL_COMMUNITY): Payer: Self-pay | Admitting: Pharmacist

## 2017-10-14 ENCOUNTER — Ambulatory Visit (HOSPITAL_COMMUNITY)
Admission: RE | Admit: 2017-10-14 | Discharge: 2017-10-14 | Disposition: A | Discharge status: Home / Self Care | Payer: Medicare Other | Source: Ambulatory Visit | Attending: Cardiology | Admitting: Cardiology

## 2017-10-14 DIAGNOSIS — Z95811 Presence of heart assist device: Secondary | ICD-10-CM

## 2017-10-14 DIAGNOSIS — Z5181 Encounter for therapeutic drug level monitoring: Secondary | ICD-10-CM | POA: Diagnosis not present

## 2017-10-14 LAB — PROTIME-INR
INR: 2.29
PROTHROMBIN TIME: 25.1 s — AB (ref 11.4–15.2)

## 2017-10-14 LAB — LACTATE DEHYDROGENASE: LDH: 343 U/L — ABNORMAL HIGH (ref 98–192)

## 2017-10-21 ENCOUNTER — Ambulatory Visit (HOSPITAL_COMMUNITY): Payer: Self-pay | Admitting: Pharmacist

## 2017-10-21 ENCOUNTER — Ambulatory Visit (HOSPITAL_COMMUNITY)
Admission: RE | Admit: 2017-10-21 | Discharge: 2017-10-21 | Disposition: A | Discharge status: Home / Self Care | Payer: Medicare Other | Source: Ambulatory Visit | Attending: Internal Medicine | Admitting: Internal Medicine

## 2017-10-21 DIAGNOSIS — Z5181 Encounter for therapeutic drug level monitoring: Secondary | ICD-10-CM | POA: Diagnosis not present

## 2017-10-21 DIAGNOSIS — H26492 Other secondary cataract, left eye: Secondary | ICD-10-CM | POA: Diagnosis not present

## 2017-10-21 DIAGNOSIS — Z95811 Presence of heart assist device: Secondary | ICD-10-CM | POA: Insufficient documentation

## 2017-10-21 LAB — PROTIME-INR
INR: 2.3
Prothrombin Time: 25.1 seconds — ABNORMAL HIGH (ref 11.4–15.2)

## 2017-10-29 ENCOUNTER — Other Ambulatory Visit (HOSPITAL_COMMUNITY): Payer: Self-pay | Admitting: Unknown Physician Specialty

## 2017-10-29 DIAGNOSIS — Z7901 Long term (current) use of anticoagulants: Secondary | ICD-10-CM

## 2017-10-29 DIAGNOSIS — Z95811 Presence of heart assist device: Secondary | ICD-10-CM

## 2017-10-30 ENCOUNTER — Ambulatory Visit (HOSPITAL_COMMUNITY): Payer: Self-pay | Admitting: Pharmacist

## 2017-10-30 ENCOUNTER — Ambulatory Visit (HOSPITAL_COMMUNITY)
Admission: RE | Admit: 2017-10-30 | Discharge: 2017-10-30 | Disposition: A | Discharge status: Home / Self Care | Payer: Medicare Other | Source: Ambulatory Visit | Attending: Internal Medicine | Admitting: Internal Medicine

## 2017-10-30 DIAGNOSIS — Z95811 Presence of heart assist device: Secondary | ICD-10-CM | POA: Insufficient documentation

## 2017-10-30 DIAGNOSIS — Z48812 Encounter for surgical aftercare following surgery on the circulatory system: Secondary | ICD-10-CM | POA: Diagnosis not present

## 2017-10-30 DIAGNOSIS — Z5181 Encounter for therapeutic drug level monitoring: Secondary | ICD-10-CM

## 2017-10-30 DIAGNOSIS — Z7901 Long term (current) use of anticoagulants: Secondary | ICD-10-CM | POA: Insufficient documentation

## 2017-10-30 LAB — LACTATE DEHYDROGENASE: LDH: 355 U/L — ABNORMAL HIGH (ref 98–192)

## 2017-10-30 LAB — PROTIME-INR
INR: 2.49
Prothrombin Time: 26.7 seconds — ABNORMAL HIGH (ref 11.4–15.2)

## 2017-11-11 ENCOUNTER — Ambulatory Visit (HOSPITAL_COMMUNITY): Payer: Self-pay | Admitting: Pharmacist

## 2017-11-11 ENCOUNTER — Ambulatory Visit (HOSPITAL_COMMUNITY)
Admission: RE | Admit: 2017-11-11 | Discharge: 2017-11-11 | Disposition: A | Discharge status: Home / Self Care | Payer: Medicare Other | Source: Ambulatory Visit | Attending: Cardiology | Admitting: Cardiology

## 2017-11-11 ENCOUNTER — Other Ambulatory Visit (HOSPITAL_COMMUNITY): Payer: Self-pay | Admitting: *Deleted

## 2017-11-11 VITALS — BP 98/0 | HR 106 | Ht 68.0 in | Wt 194.6 lb

## 2017-11-11 DIAGNOSIS — I110 Hypertensive heart disease with heart failure: Secondary | ICD-10-CM | POA: Diagnosis not present

## 2017-11-11 DIAGNOSIS — Z7901 Long term (current) use of anticoagulants: Secondary | ICD-10-CM | POA: Insufficient documentation

## 2017-11-11 DIAGNOSIS — Z95828 Presence of other vascular implants and grafts: Secondary | ICD-10-CM | POA: Diagnosis not present

## 2017-11-11 DIAGNOSIS — Z7902 Long term (current) use of antithrombotics/antiplatelets: Secondary | ICD-10-CM | POA: Insufficient documentation

## 2017-11-11 DIAGNOSIS — I509 Heart failure, unspecified: Secondary | ICD-10-CM | POA: Diagnosis not present

## 2017-11-11 DIAGNOSIS — Z7982 Long term (current) use of aspirin: Secondary | ICD-10-CM | POA: Insufficient documentation

## 2017-11-11 DIAGNOSIS — I5043 Acute on chronic combined systolic (congestive) and diastolic (congestive) heart failure: Secondary | ICD-10-CM | POA: Insufficient documentation

## 2017-11-11 DIAGNOSIS — E785 Hyperlipidemia, unspecified: Secondary | ICD-10-CM | POA: Diagnosis not present

## 2017-11-11 DIAGNOSIS — Z95811 Presence of heart assist device: Secondary | ICD-10-CM

## 2017-11-11 DIAGNOSIS — I2510 Atherosclerotic heart disease of native coronary artery without angina pectoris: Secondary | ICD-10-CM | POA: Insufficient documentation

## 2017-11-11 DIAGNOSIS — M459 Ankylosing spondylitis of unspecified sites in spine: Secondary | ICD-10-CM | POA: Diagnosis not present

## 2017-11-11 DIAGNOSIS — E119 Type 2 diabetes mellitus without complications: Secondary | ICD-10-CM | POA: Insufficient documentation

## 2017-11-11 DIAGNOSIS — Z86711 Personal history of pulmonary embolism: Secondary | ICD-10-CM | POA: Diagnosis not present

## 2017-11-11 DIAGNOSIS — Z8249 Family history of ischemic heart disease and other diseases of the circulatory system: Secondary | ICD-10-CM | POA: Diagnosis not present

## 2017-11-11 DIAGNOSIS — I252 Old myocardial infarction: Secondary | ICD-10-CM | POA: Diagnosis not present

## 2017-11-11 DIAGNOSIS — Z79899 Other long term (current) drug therapy: Secondary | ICD-10-CM | POA: Insufficient documentation

## 2017-11-11 DIAGNOSIS — Z87891 Personal history of nicotine dependence: Secondary | ICD-10-CM | POA: Insufficient documentation

## 2017-11-11 DIAGNOSIS — I255 Ischemic cardiomyopathy: Secondary | ICD-10-CM | POA: Insufficient documentation

## 2017-11-11 DIAGNOSIS — Z86718 Personal history of other venous thrombosis and embolism: Secondary | ICD-10-CM | POA: Insufficient documentation

## 2017-11-11 DIAGNOSIS — N529 Male erectile dysfunction, unspecified: Secondary | ICD-10-CM | POA: Insufficient documentation

## 2017-11-11 DIAGNOSIS — Z5181 Encounter for therapeutic drug level monitoring: Secondary | ICD-10-CM

## 2017-11-11 LAB — BASIC METABOLIC PANEL
ANION GAP: 7 (ref 5–15)
BUN: 13 mg/dL (ref 6–20)
CHLORIDE: 104 mmol/L (ref 101–111)
CO2: 27 mmol/L (ref 22–32)
Calcium: 9.3 mg/dL (ref 8.9–10.3)
Creatinine, Ser: 1.14 mg/dL (ref 0.61–1.24)
GFR calc Af Amer: >60 mL/min (ref >60)
Glucose, Bld: 101 mg/dL — ABNORMAL HIGH (ref 65–99)
POTASSIUM: 4.7 mmol/L (ref 3.5–5.1)
SODIUM: 138 mmol/L (ref 135–145)

## 2017-11-11 LAB — CBC
HEMATOCRIT: 38.2 % — AB (ref 39.0–52.0)
HEMOGLOBIN: 11.9 g/dL — AB (ref 13.0–17.0)
MCH: 26.9 pg (ref 26.0–34.0)
MCHC: 31.2 g/dL (ref 30.0–36.0)
MCV: 86.2 fL (ref 78.0–100.0)
Platelets: 371 10*3/uL (ref 150–400)
RBC: 4.43 MIL/uL (ref 4.22–5.81)
RDW: 17.4 % — ABNORMAL HIGH (ref 11.5–15.5)
WBC: 9.4 10*3/uL (ref 4.0–10.5)

## 2017-11-11 LAB — LACTATE DEHYDROGENASE: LDH: 330 U/L — ABNORMAL HIGH (ref 98–192)

## 2017-11-11 LAB — PROTIME-INR
INR: 2.61
Prothrombin Time: 27.7 seconds — ABNORMAL HIGH (ref 11.4–15.2)

## 2017-11-11 NOTE — Progress Notes (Signed)
Patient presents for 2 month  follow up in [facility] today. Reports no problems with VAD equipment or concerns with drive line.  Vital Signs:  Doppler Pressure  98   Automatc BP:  102/72 (89) HR: 106 SPO2: 97%  Weight: 194.6  lb w/o eqt Last weight: 196.2 lb Home weights: 186 - 188 lbs   VAD Indication: Destination Therapy patient choice    VAD interrogation & Equipment Management: Speed:9200 Flow: 4.9 Power: 6.0 w    PI: 0-5  Alarms: no clinical alarms Events: 3-5 PI events per day  Fixed speed 9200 Low speed limit: 8600  Primary Controller:  Replace back up battery in 15 months.  Annual Equipment Maintenance on UBC/PM was performed on 08/2017.   I reviewed the LVAD parameters from today and compared the results to the patient's prior recorded data. LVAD interrogation was NEGATIVE for significant power changes, NEGATIVE for clinical alarms and STABLE for PI events/speed drops. No programming changes were made and pump is functioning within specified parameters. Pt is performing daily controller and system monitor self tests along with completing weekly and monthly maintenance for LVAD equipment.  LVAD equipment check completed and is in good working order. Back-up equipment present.   Exit Site Care: Drive line is being maintained weekly  by his wife, [name]. Drive line exit site well healed and incorporated. The velour is fully implanted at exit site. Dressing dry and intact. No erythema or drainage. Stabilization device present and accurately applied. Pt denies fever or chills. 12 Sorbaview dressings and 10 anchors were given to the pt today.   Significant Events on VAD Support:  02/21/17> lap chole  Device:none   BP & Labs:  MAP 98 - Doppler is reflecting modified systolic  Hgb 43.1 - No S/S of bleeding. Specifically denies melena/BRBPR or nosebleeds.  LDH is stable at 330 with established baseline of 250- 320. Denies tea-colored urine. No power  elevations noted on interrogation. We will continue to check this weekly with his INR.  Patient Instructions:  1. RTC in 1 week for inr/ldh 2. RTC in 2 months.   Zada Girt RN [place] Coordinator   Office: [phone] 24/7 Emergency VAD Pager: [phone]

## 2017-11-11 NOTE — Progress Notes (Signed)
Patient ID: Scott [name], male   DOB: 06-25-1946, 72 y.o.   MRN: [id number]   VAD CLINIC NOTE  Primary Cardiologist: Dr Haroldine Laws INR : [facility] [place]  HPI: Scott [name] is a 72 y/o with h/o CAD with ankylosing spondylitis, DM2, carotid stenting, severe ischemic CM s/p anterior STEM with VT arrest in 12/15, large PE with pulmonary infarct in 2/15 s/p IVC filter and systolic HF with EF 78%.  Scott [name] underwent HM II LVAD placement on 09/20/13. Scott [name] had a prolonged hospital course due to recurrent chest bleeding, severe CO2 retention and RV failure.  In 8/18 admitted with acute gallstone pancreatitis. Underwent lap chole 02/21/17.   Admitted 2/18 with elevated LDH. Scott [name] was started on bivalirudin. LDH went down [phone]. Bivalirudin was stopped with INR check 4 hours later. INR was stable at  2.  Scott [name] presents today for regular follow up. Overall feeling fine. Working out in the yard and being very active. Denies DOE/PND/orthopnea. No lightheadedness or dizziness. No CP. Gets occasional SOB with steps. Appetite OK. Doesn't need any extra lasix. Occasional cuts back during the summer months when Scott [name] is sweating a lot. Denies bleeding or dark urine. No fevers or chills. Taking all medications as directed.   VAD Indication: Destination Therapy- Patient choice.   VAD interrogation & Equipment Management: Speed: 9200 Flow: 4.9 Power: 6.0 PI: 5.9  Alarms: One power, several low voltatge Events: 0-5 events daily.  Fixed speed 9200 Low speed limit: 8600  Primary Controller: Replace back up battery in 15 months.  Annual Equipment Maintenance on UBC/PM was performed on 08/2017.  Denies LVAD alarms.  Denies driveline trauma, erythema or drainage.  Denies ICD shocks. Reports taking Coumadin as prescribed and adherence to anticoagulation based dietary restrictions.  Denies bright red blood per rectum or melena, no dark urine or hematuria.    I reviewed the LVAD parameters from today, and compared the  results to the patient's prior recorded data.  No programming changes were made.  The LVAD is functioning within specified parameters.  The patient performs LVAD self-test daily.  LVAD interrogation was negative for any significant power changes, alarms or PI events/speed drops.  LVAD equipment check completed and is in good working order.  Back-up equipment present.   LVAD education done on emergency procedures and precautions and reviewed exit site care.  Review of systems complete and found to be negative unless listed in HPI.    SH:  Social History   Socioeconomic History  . Marital status: Married    Spouse name: Not on file  . Number of children: Not on file  . Years of education: Not on file  . Highest education level: Not on file  Occupational History  . Occupation: retired  Scientific laboratory technician  . Financial resource strain: Not on file  . Food insecurity:    Worry: Not on file    Inability: Not on file  . Transportation needs:    Medical: Not on file    Non-medical: Not on file  Tobacco Use  . Smoking status: Former Smoker    Packs/day: 0.30    Years: 0.00    Pack years: 0.00    Types: Cigarettes    Last attempt to quit: 06/12/1969    Years since quitting: 48.4  . Smokeless tobacco: Never Used  . 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  Substance and Sexual Activity  . Alcohol use: No    Alcohol/week: 0.0 oz  . Drug use:  No  . Sexual activity: Not Currently  Lifestyle  . Physical activity:    Days per week: Not on file    Minutes per session: Not on file  . Stress: Not on file  Relationships  . Social connections:    Talks on phone: Not on file    Gets together: Not on file    Attends religious service: Not on file    Active member of club or organization: Not on file    Attends meetings of clubs or organizations: Not on file    Relationship status: Not on file  . Intimate partner violence:    Fear of current or ex partner: Not on file     Emotionally abused: Not on file    Physically abused: Not on file    Forced sexual activity: Not on file  Other Topics Concern  . Not on file  Social History Narrative  . Not on file    FH:  Family History  Problem Relation Age of Onset  . Hypertension Father   . Heart attack Father   . Heart Problems Sister     Past Medical History:  Diagnosis Date  . Acute on chronic respiratory failure ([place])    a. 08/2014 in setting of PE.  Marland Kitchen Ankylosing spondylitis ([place])   . Bilateral pulmonary embolism ([place])    a. 08/2014 - started on Coumadin. Retrievable IVC filter placed 08/27/14 due to RV strain and large clot burden.  Marland Kitchen CAD (coronary artery disease)    a. stenting of LCx 2013; b. STEMI 06/12/14 s/p PCI to LAD complicated by post cath shock requiring IABP; VT s/p DCCV, EF 20%; c. NSTEMI 06/26/14 treated medically.  . Carotid artery disease ([place])    a. s/p stenting.  . Chronic systolic CHF (congestive heart failure) ([place])   . Diabetes ([place])   . Hemoptysis    a. 08/2014 possibly due to pulm infarct/PE.  Marland Kitchen Hyperlipidemia   . Hypertension   . Hypotension   . Ischemic cardiomyopathy    a. echo 08/23/2014 EF <20%, dilated CM, mod MR/TR  . Leukocytosis   . Leukocytosis 09/10/2014  . Nephrolithiasis   . Pleural effusion on right 08/2014 - small  . Reactive thrombocytosis 09/10/2014  . Right leg DVT ([place])    a. 08/2014.  Marland Kitchen Ventricular tachycardia ([place])    a. 06/2014 at time of MI, s/p DCCV.   Current Outpatient Medications  Medication Sig Dispense Refill  . acetaminophen (TYLENOL) 500 MG tablet Take 1,000 mg by mouth every 6 (six) hours as needed for moderate pain.     Marland Kitchen aspirin EC 81 MG tablet Take 1 tablet (81 mg total) by mouth daily. 90 tablet 3  . clobetasol cream (TEMOVATE) 8.11 % Apply 1 application topically 2 (two) times daily as needed (rash).     . clopidogrel (PLAVIX) 75 MG tablet Take 1 tablet (75 mg total) by mouth daily. 90 tablet 3  . cyclobenzaprine (FLEXERIL) 5 MG tablet  Take 1 tablet (5 mg total) by mouth 3 (three) times daily as needed for muscle spasms. 270 tablet 3  . furosemide (LASIX) 20 MG tablet Every other day alternate 20 mg (1 pill) with 40 mg (2 pills) 135 tablet 3  . pantoprazole (PROTONIX) 40 MG tablet Take 1 tablet (40 mg total) by mouth at bedtime. 90 tablet 3  . potassium chloride SA (K-DUR,KLOR-CON) 20 MEQ tablet Every other day alternate 20 mEq (1 tablet) with 40 mEq (2 tablets) 135 tablet 3  .  rosuvastatin (CRESTOR) 10 MG tablet Take 1 tablet (10 mg total) by mouth at bedtime. 90 tablet 3  . sildenafil (VIAGRA) 100 MG tablet Take 1 tablet (100 mg total) by mouth daily as needed for erectile dysfunction. (Patient taking differently: Take 50 mg by mouth daily as needed for erectile dysfunction. ) 10 tablet 3  . traMADol (ULTRAM) 50 MG tablet Take 1 tablet (50 mg total) by mouth every 12 (twelve) hours as needed for moderate pain. 60 tablet 1  . traZODone (DESYREL) 100 MG tablet Take 1 tablet (100 mg total) by mouth at bedtime. 90 tablet 6  . warfarin (COUMADIN) 6 MG tablet Take 1 tablet (6 mg) daily except 1 and 1/2 tablets (9 mg) on Friday.     No current facility-administered medications for this encounter.    Vital Signs:  Vitals:   11/11/17 1019 11/11/17 1021  BP: 102/72 (!) 98/0  Pulse: (!) 106   SpO2: 97%     Wt Readings from Last 3 Encounters:  11/11/17 194 lb 9.6 oz (88.3 kg)  09/10/17 196 lb 3.2 oz (89 kg)  08/21/17 186 lb 4.6 oz (84.5 kg)   PHYSICAL EXAM: GENERAL: Well appearing this am. NAD.  HEENT: Normal. NECK: Supple, JVP 7-8 cm. Carotids OK.  CARDIAC:  Mechanical heart sounds with LVAD hum present.  LUNGS:  CTAB, normal effort.  ABDOMEN:  NT, ND, no HSM. No bruits or masses. +BS  LVAD exit site: Well-healed and incorporated. Dressing dry and intact. No erythema or drainage. Stabilization device present and accurately applied. Driveline dressing changed daily per sterile technique. EXTREMITIES:  Warm and dry. No  cyanosis, clubbing, rash, or edema.  NEUROLOGIC:  Alert & oriented x 3. Cranial nerves grossly intact. Moves all 4 extremities w/o difficulty. Affect pleasant      ASSESSMENT & PLAN: 1. Chronic systolic HF: Ischemic cardiomyopathy, EF 20% s/p HMII LVAD 09/20/13.   - NYHA II with VAD support.  - Volume status stable on exam - Continue lasix 40 mg daily alternating with 20 mg daily.   - Functionally stable. Refuses transplant evaluation.  - Reinforced fluid restriction to < 2 L daily, sodium restriction to less than 2000 mg daily, and the importance of daily weights.    2. CAD s/p Anterior STEMI on 06/12/14 with stenting of LAD:  - No s/s of ischemia.    - Continue statin. Off ASA due to bleeding.  3. VAD:  Had admit in 08/2017 for elevated LDH.  - LDH 330 (Was 355)  - Interrogated device personally. Parameters stable.  - Off asa. On coumadin.  4. Ankylosing spondylitis: Off NSAIDS. -  Follows with rheumatology. Uses tramadol. Avoids NSAIDs as much as possible. - Pain has been well controlled. No change to current plan.   5. Gallstone pancreatitis - s/p lap chole 02/21/17. No further.  6. Anticoagulation: - On coumadin.  - INR 2.61. Discussed dosing with Pharm D-Personally.   7. HTN:  - Continue current regimen as above.  8. Erectile dysfunction  - Has PRN sildenafil. - No change to current plan.    Doing well overall. Stable labs today. RTC 2 months.   Shirley Friar, PA-C  11/11/2017   Patient seen and examined with the above-signed Advanced Practice Provider and/or Housestaff. I personally reviewed laboratory data, imaging studies and relevant notes. I independently examined the patient and formulated the important aspects of the plan. I have edited the note to reflect any of my changes or salient points. I have  personally discussed the plan with the patient and/or family.  Scott [name] is doing very well with VAD support. NYHA I-II. MAPs ok. LDH now coming down on plavix. No  bleeding. VAD interrogated personally. Parameters stable. INR 2.6 Discussed dosing with PharmD personally.  On exam  General:  NAD.  HEENT: normal  Neck: supple. JVP not elevated.  Carotids 2+ bilat; no bruits. No lymphadenopathy or thryomegaly appreciated. Cor: LVAD hum.  Lungs: Clear. Abdomen: soft, nontender, non-distended. No hepatosplenomegaly. No bruits or masses. Good bowel sounds. Driveline site clean. Anchor in place.  Extremities: no cyanosis, clubbing, rash. Warm no edema  Neuro: alert & oriented x 3. No focal deficits. Moves all 4 without problem   Continue current therapy. F/u in 3 months.   [name], MD  6:13 PM

## 2017-11-11 NOTE — Patient Instructions (Signed)
1. No change in medications. 2.

## 2017-11-15 ENCOUNTER — Other Ambulatory Visit (HOSPITAL_COMMUNITY): Payer: Self-pay | Admitting: Unknown Physician Specialty

## 2017-11-15 DIAGNOSIS — Z95811 Presence of heart assist device: Secondary | ICD-10-CM

## 2017-11-15 DIAGNOSIS — Z7901 Long term (current) use of anticoagulants: Secondary | ICD-10-CM

## 2017-11-20 ENCOUNTER — Ambulatory Visit (HOSPITAL_COMMUNITY)
Admission: RE | Admit: 2017-11-20 | Discharge: 2017-11-20 | Disposition: A | Discharge status: Home / Self Care | Payer: Medicare Other | Source: Ambulatory Visit | Attending: Internal Medicine | Admitting: Internal Medicine

## 2017-11-20 ENCOUNTER — Ambulatory Visit (HOSPITAL_COMMUNITY): Payer: Self-pay | Admitting: Pharmacist

## 2017-11-20 DIAGNOSIS — Z7901 Long term (current) use of anticoagulants: Secondary | ICD-10-CM | POA: Diagnosis not present

## 2017-11-20 DIAGNOSIS — Z95811 Presence of heart assist device: Secondary | ICD-10-CM | POA: Diagnosis not present

## 2017-11-20 DIAGNOSIS — Z5181 Encounter for therapeutic drug level monitoring: Secondary | ICD-10-CM

## 2017-11-20 LAB — PROTIME-INR
INR: 2.98
Prothrombin Time: 30.7 seconds — ABNORMAL HIGH (ref 11.4–15.2)

## 2017-11-20 LAB — LACTATE DEHYDROGENASE: LDH: 289 U/L — AB (ref 98–192)

## 2017-11-20 NOTE — Patient Instructions (Addendum)
Left vm, will call back with appt information  Patients wife returned call and aware of instructions, will return for labs 6/5 @ 930

## 2017-12-03 ENCOUNTER — Other Ambulatory Visit (HOSPITAL_COMMUNITY): Payer: Self-pay | Admitting: *Deleted

## 2017-12-03 DIAGNOSIS — Z7901 Long term (current) use of anticoagulants: Secondary | ICD-10-CM

## 2017-12-03 DIAGNOSIS — Z95811 Presence of heart assist device: Secondary | ICD-10-CM

## 2017-12-04 ENCOUNTER — Ambulatory Visit (HOSPITAL_COMMUNITY): Payer: Self-pay | Admitting: Pharmacist

## 2017-12-04 ENCOUNTER — Ambulatory Visit (HOSPITAL_COMMUNITY)
Admission: RE | Admit: 2017-12-04 | Discharge: 2017-12-04 | Disposition: A | Discharge status: Home / Self Care | Payer: Medicare Other | Source: Ambulatory Visit | Attending: Internal Medicine | Admitting: Internal Medicine

## 2017-12-04 DIAGNOSIS — Z95811 Presence of heart assist device: Secondary | ICD-10-CM

## 2017-12-04 DIAGNOSIS — Z7901 Long term (current) use of anticoagulants: Secondary | ICD-10-CM

## 2017-12-04 DIAGNOSIS — Z5181 Encounter for therapeutic drug level monitoring: Secondary | ICD-10-CM

## 2017-12-04 LAB — PROTIME-INR
INR: 2.75
PROTHROMBIN TIME: 28.9 s — AB (ref 11.4–15.2)

## 2017-12-04 LAB — LACTATE DEHYDROGENASE: LDH: 312 U/L — AB (ref 98–192)

## 2017-12-18 ENCOUNTER — Other Ambulatory Visit (HOSPITAL_COMMUNITY): Payer: Self-pay | Admitting: Unknown Physician Specialty

## 2017-12-18 ENCOUNTER — Ambulatory Visit (HOSPITAL_COMMUNITY): Payer: Self-pay | Admitting: Pharmacist

## 2017-12-18 ENCOUNTER — Ambulatory Visit (HOSPITAL_COMMUNITY)
Admission: RE | Admit: 2017-12-18 | Discharge: 2017-12-18 | Disposition: A | Discharge status: Home / Self Care | Payer: Medicare Other | Source: Ambulatory Visit | Attending: Cardiology | Admitting: Cardiology

## 2017-12-18 DIAGNOSIS — Z95811 Presence of heart assist device: Secondary | ICD-10-CM

## 2017-12-18 DIAGNOSIS — Z7901 Long term (current) use of anticoagulants: Secondary | ICD-10-CM | POA: Diagnosis not present

## 2017-12-18 DIAGNOSIS — Z5181 Encounter for therapeutic drug level monitoring: Secondary | ICD-10-CM

## 2017-12-18 LAB — PROTIME-INR
INR: 2.32
Prothrombin Time: 25.3 seconds — ABNORMAL HIGH (ref 11.4–15.2)

## 2017-12-18 LAB — LACTATE DEHYDROGENASE: LDH: 294 U/L — AB (ref 98–192)

## 2017-12-27 ENCOUNTER — Other Ambulatory Visit (HOSPITAL_COMMUNITY): Payer: Self-pay | Admitting: Unknown Physician Specialty

## 2017-12-27 DIAGNOSIS — Z95811 Presence of heart assist device: Secondary | ICD-10-CM

## 2017-12-27 DIAGNOSIS — Z7901 Long term (current) use of anticoagulants: Secondary | ICD-10-CM

## 2018-01-01 ENCOUNTER — Ambulatory Visit (HOSPITAL_COMMUNITY)
Admission: RE | Admit: 2018-01-01 | Discharge: 2018-01-01 | Disposition: A | Discharge status: Home / Self Care | Payer: Medicare Other | Source: Ambulatory Visit | Attending: Internal Medicine | Admitting: Internal Medicine

## 2018-01-01 ENCOUNTER — Ambulatory Visit (HOSPITAL_COMMUNITY): Payer: Self-pay | Admitting: Pharmacist

## 2018-01-01 DIAGNOSIS — Z5181 Encounter for therapeutic drug level monitoring: Secondary | ICD-10-CM

## 2018-01-01 DIAGNOSIS — Z95811 Presence of heart assist device: Secondary | ICD-10-CM

## 2018-01-01 DIAGNOSIS — Z7901 Long term (current) use of anticoagulants: Secondary | ICD-10-CM | POA: Diagnosis not present

## 2018-01-01 LAB — PROTIME-INR
INR: 2.33
PROTHROMBIN TIME: 25.4 s — AB (ref 11.4–15.2)

## 2018-01-01 LAB — LACTATE DEHYDROGENASE: LDH: 341 U/L — ABNORMAL HIGH (ref 98–192)

## 2018-01-13 ENCOUNTER — Ambulatory Visit (HOSPITAL_COMMUNITY): Payer: Self-pay | Admitting: Pharmacist

## 2018-01-13 ENCOUNTER — Ambulatory Visit (HOSPITAL_COMMUNITY)
Admission: RE | Admit: 2018-01-13 | Discharge: 2018-01-13 | Disposition: A | Discharge status: Home / Self Care | Payer: Medicare Other | Source: Ambulatory Visit | Attending: Cardiology | Admitting: Cardiology

## 2018-01-13 DIAGNOSIS — Z7901 Long term (current) use of anticoagulants: Secondary | ICD-10-CM

## 2018-01-13 DIAGNOSIS — Z95811 Presence of heart assist device: Secondary | ICD-10-CM

## 2018-01-13 DIAGNOSIS — Z5181 Encounter for therapeutic drug level monitoring: Secondary | ICD-10-CM

## 2018-01-13 LAB — LACTATE DEHYDROGENASE: LDH: 301 U/L — ABNORMAL HIGH (ref 98–192)

## 2018-01-13 LAB — PROTIME-INR
INR: 2.39
Prothrombin Time: 25.9 seconds — ABNORMAL HIGH (ref 11.4–15.2)

## 2018-01-29 ENCOUNTER — Ambulatory Visit (HOSPITAL_COMMUNITY)
Admission: RE | Admit: 2018-01-29 | Discharge: 2018-01-29 | Disposition: A | Discharge status: Home / Self Care | Payer: Medicare Other | Source: Ambulatory Visit | Attending: Cardiology | Admitting: Cardiology

## 2018-01-29 ENCOUNTER — Other Ambulatory Visit (HOSPITAL_COMMUNITY): Payer: Self-pay | Admitting: *Deleted

## 2018-01-29 ENCOUNTER — Ambulatory Visit (HOSPITAL_COMMUNITY): Payer: Self-pay | Admitting: Pharmacist

## 2018-01-29 DIAGNOSIS — Z7901 Long term (current) use of anticoagulants: Secondary | ICD-10-CM | POA: Insufficient documentation

## 2018-01-29 DIAGNOSIS — Z5181 Encounter for therapeutic drug level monitoring: Secondary | ICD-10-CM

## 2018-01-29 DIAGNOSIS — Z95811 Presence of heart assist device: Secondary | ICD-10-CM | POA: Insufficient documentation

## 2018-01-29 LAB — LACTATE DEHYDROGENASE: LDH: 295 U/L — ABNORMAL HIGH (ref 98–192)

## 2018-01-29 LAB — PROTIME-INR
INR: 2.35
Prothrombin Time: 25.5 seconds — ABNORMAL HIGH (ref 11.4–15.2)

## 2018-02-10 ENCOUNTER — Other Ambulatory Visit (HOSPITAL_COMMUNITY): Payer: Self-pay | Admitting: *Deleted

## 2018-02-10 DIAGNOSIS — Z95811 Presence of heart assist device: Secondary | ICD-10-CM

## 2018-02-10 DIAGNOSIS — I5043 Acute on chronic combined systolic (congestive) and diastolic (congestive) heart failure: Secondary | ICD-10-CM

## 2018-02-10 DIAGNOSIS — Z7901 Long term (current) use of anticoagulants: Secondary | ICD-10-CM

## 2018-02-11 ENCOUNTER — Ambulatory Visit (HOSPITAL_COMMUNITY): Payer: Self-pay | Admitting: Pharmacist

## 2018-02-11 ENCOUNTER — Ambulatory Visit (HOSPITAL_COMMUNITY)
Admission: RE | Admit: 2018-02-11 | Discharge: 2018-02-11 | Disposition: A | Discharge status: Home / Self Care | Payer: Medicare Other | Source: Ambulatory Visit | Attending: Internal Medicine | Admitting: Internal Medicine

## 2018-02-11 ENCOUNTER — Encounter (HOSPITAL_COMMUNITY): Payer: Self-pay

## 2018-02-11 VITALS — BP 107/67 | HR 103 | Ht 68.0 in | Wt 194.2 lb

## 2018-02-11 DIAGNOSIS — I10 Essential (primary) hypertension: Secondary | ICD-10-CM | POA: Diagnosis not present

## 2018-02-11 DIAGNOSIS — Z86718 Personal history of other venous thrombosis and embolism: Secondary | ICD-10-CM | POA: Insufficient documentation

## 2018-02-11 DIAGNOSIS — E119 Type 2 diabetes mellitus without complications: Secondary | ICD-10-CM | POA: Diagnosis not present

## 2018-02-11 DIAGNOSIS — Z8249 Family history of ischemic heart disease and other diseases of the circulatory system: Secondary | ICD-10-CM | POA: Insufficient documentation

## 2018-02-11 DIAGNOSIS — Z95828 Presence of other vascular implants and grafts: Secondary | ICD-10-CM | POA: Insufficient documentation

## 2018-02-11 DIAGNOSIS — Z95811 Presence of heart assist device: Secondary | ICD-10-CM

## 2018-02-11 DIAGNOSIS — I255 Ischemic cardiomyopathy: Secondary | ICD-10-CM | POA: Diagnosis not present

## 2018-02-11 DIAGNOSIS — Z955 Presence of coronary angioplasty implant and graft: Secondary | ICD-10-CM | POA: Diagnosis not present

## 2018-02-11 DIAGNOSIS — Z86711 Personal history of pulmonary embolism: Secondary | ICD-10-CM | POA: Insufficient documentation

## 2018-02-11 DIAGNOSIS — I252 Old myocardial infarction: Secondary | ICD-10-CM | POA: Insufficient documentation

## 2018-02-11 DIAGNOSIS — Z79899 Other long term (current) drug therapy: Secondary | ICD-10-CM | POA: Diagnosis not present

## 2018-02-11 DIAGNOSIS — Z7982 Long term (current) use of aspirin: Secondary | ICD-10-CM | POA: Diagnosis not present

## 2018-02-11 DIAGNOSIS — G8929 Other chronic pain: Secondary | ICD-10-CM | POA: Diagnosis not present

## 2018-02-11 DIAGNOSIS — I5022 Chronic systolic (congestive) heart failure: Secondary | ICD-10-CM | POA: Insufficient documentation

## 2018-02-11 DIAGNOSIS — I5043 Acute on chronic combined systolic (congestive) and diastolic (congestive) heart failure: Secondary | ICD-10-CM | POA: Diagnosis not present

## 2018-02-11 DIAGNOSIS — E785 Hyperlipidemia, unspecified: Secondary | ICD-10-CM | POA: Diagnosis not present

## 2018-02-11 DIAGNOSIS — Z7902 Long term (current) use of antithrombotics/antiplatelets: Secondary | ICD-10-CM | POA: Insufficient documentation

## 2018-02-11 DIAGNOSIS — Z5181 Encounter for therapeutic drug level monitoring: Secondary | ICD-10-CM

## 2018-02-11 DIAGNOSIS — Z7901 Long term (current) use of anticoagulants: Secondary | ICD-10-CM | POA: Insufficient documentation

## 2018-02-11 DIAGNOSIS — I110 Hypertensive heart disease with heart failure: Secondary | ICD-10-CM | POA: Diagnosis not present

## 2018-02-11 DIAGNOSIS — Z87891 Personal history of nicotine dependence: Secondary | ICD-10-CM | POA: Insufficient documentation

## 2018-02-11 DIAGNOSIS — I2510 Atherosclerotic heart disease of native coronary artery without angina pectoris: Secondary | ICD-10-CM | POA: Insufficient documentation

## 2018-02-11 LAB — BASIC METABOLIC PANEL
Anion gap: 8 (ref 5–15)
BUN: 16 mg/dL (ref 8–23)
CALCIUM: 9.2 mg/dL (ref 8.9–10.3)
CHLORIDE: 104 mmol/L (ref 98–111)
CO2: 27 mmol/L (ref 22–32)
Creatinine, Ser: 1.06 mg/dL (ref 0.61–1.24)
GFR calc Af Amer: >60 mL/min (ref >60)
GFR calc non Af Amer: >60 mL/min (ref >60)
GLUCOSE: 122 mg/dL — AB (ref 70–99)
Potassium: 3.8 mmol/L (ref 3.5–5.1)
Sodium: 139 mmol/L (ref 135–145)

## 2018-02-11 LAB — CBC
HEMATOCRIT: 39.6 % (ref 39.0–52.0)
Hemoglobin: 12 g/dL — ABNORMAL LOW (ref 13.0–17.0)
MCH: 26.1 pg (ref 26.0–34.0)
MCHC: 30.3 g/dL (ref 30.0–36.0)
MCV: 86.3 fL (ref 78.0–100.0)
Platelets: 387 10*3/uL (ref 150–400)
RBC: 4.59 MIL/uL (ref 4.22–5.81)
RDW: 17 % — ABNORMAL HIGH (ref 11.5–15.5)
WBC: 9.6 10*3/uL (ref 4.0–10.5)

## 2018-02-11 LAB — PROTIME-INR
INR: 2.49
PROTHROMBIN TIME: 26.7 s — AB (ref 11.4–15.2)

## 2018-02-11 LAB — LACTATE DEHYDROGENASE: LDH: 288 U/L — ABNORMAL HIGH (ref 98–192)

## 2018-02-11 MED ORDER — TRAMADOL HCL 50 MG PO TABS: 100.0000 mg | ORAL_TABLET | Freq: Four times a day (QID) | ORAL | 3 refills | Status: DC | PRN

## 2018-02-11 NOTE — Progress Notes (Signed)
Patient presents for 3 month  follow up in [facility] today with wife. Reports no problems with VAD equipment or concerns with drive line.  Vital Signs:  Doppler Pressure  98  Automatc BP:  107/67 (79) HR: 103 SPO2: 97%  Weight: 194.2  lb w/o eqt Last weight: 194.6 lbs   VAD Indication: Destination Therapy patient choice    VAD interrogation & Equipment Management: Speed:9200 Flow: 4.7 Power: 5.2w    PI: 6.1  Alarms: low voltage x 1 Events: 3-5 PI events per day  Fixed speed 9200 Low speed limit: 8600  Primary Controller:  Replace back up battery in 12 months Back up Controller: Pt did not bring to clinic today  Annual Equipment Maintenance on UBC/PM was performed on 08/2017.   I reviewed the LVAD parameters from today and compared the results to the patient's prior recorded data. LVAD interrogation was NEGATIVE for significant power changes, NEGATIVE for clinical alarms and STABLE for PI events/speed drops. No programming changes were made and pump is functioning within specified parameters. Pt is performing daily controller and system monitor self tests along with completing weekly and monthly maintenance for LVAD equipment.  LVAD equipment check completed and is in good working order. Back-up equipment not present.   Exit Site Care: Drive line is being maintained weekly  by his wife, [name]. Drive line exit site well healed and incorporated. The velour is fully implanted at exit site. Dressing dry and intact. No erythema or drainage. Stabilization device present and accurately applied. Pt denies fever or chills. 24 Sorbaview dressings and 20 anchors were given to the pt today.   Significant Events on VAD Support:  02/21/17> lap chole  Device:none   BP & Labs:  MAP 98 - Doppler is reflecting modified systolic  Hgb 34.1 - No S/S of bleeding. Specifically denies melena/BRBPR or nosebleeds.  LDH is stable at 288 with established baseline of 250- 320. Denies  tea-colored urine. No power elevations noted on interrogation. We will continue to check this weekly with his INR.  3.5 year Intermacs follow up completed including:  Quality of Life, KCCQ-12, and Neurocognitive trail making.   Pt completed  feet during 6 minute walk.  Back up controller:  11V backup battery charged by wife every 6 months.    Patient Instructions:  1. No change in meds 2. PharmD will contact you with INR and coumadin dosing 3. Return to [facility] in 3 months  [place] Coordinator   Office: [phone] 24/7 Emergency VAD Pager: [phone]

## 2018-02-11 NOTE — Progress Notes (Signed)
Patient ID: IVAL PACER, male   DOB: 08-30-1945, 72 y.o.   MRN: [id number]   VAD CLINIC NOTE  Primary Cardiologist: Dr Haroldine Laws INR : [facility] [place]  HPI: Scott [name] is a 72 y/o male with h/o CAD with ankylosing spondylitis, DM2, carotid stenting, severe ischemic CM s/p anterior STEM with VT arrest in 12/15, large PE with pulmonary infarct in 2/15 s/p IVC filter and systolic HF with EF 38%.  He underwent HM II LVAD placement on 09/20/13. He had a prolonged hospital course due to recurrent chest bleeding, severe CO2 retention and RV failure.  In 8/18 admitted with acute gallstone pancreatitis. Underwent lap chole 02/21/17.   Admitted 2/18 with elevated LDH. He was started on bivalirudin. LDH went down [phone].   He presents today for regular follow up. Continues to do well. Remains active with yardwork and with his cars without any difficulty. Denies orthopnea or PND. No fevers, chills or problems with driveline. No bleeding, melena or neuro symptoms. No VAD alarms. Taking all meds as prescribed. Still struggles with arthritis due to his ankylosing spondylitis. Weight chart reviewed and weights very stable.    VAD Indication: Destination Therapy patient choice    VAD interrogation & Equipment Management: Speed:9200 Flow: 4.9 Power: 6.0 w PI: 0-5  Alarms: no clinical alarms Events: 3-5 PI events per day  Fixed speed 9200 Low speed limit: 8600  Primary Controller: Replace back up battery in 15 months.  Annual Equipment Maintenance on UBC/PM was performed on 08/2017.  I reviewed the LVAD parameters from todayand compared the results to the patient's prior recorded data.LVAD interrogation was NEGATIVEfor significant power changes, NEGATIVEfor clinicalalarms and STABLEfor PI events/speed drops. No programming changes were madeand pump is functioning within specified parameters. Pt is performing daily controller and system monitor self tests along with completing  weekly and monthly maintenance for LVAD equipment.  LVAD equipment check completed and is in good working order. Back-up equipment present.   Review of systems complete and found to be negative unless listed in HPI.    SH:  Social History   Socioeconomic History  . Marital status: Married    Spouse name: Not on file  . Number of children: Not on file  . Years of education: Not on file  . Highest education level: Not on file  Occupational History  . Occupation: retired  Scientific laboratory technician  . Financial resource strain: Not on file  . Food insecurity:    Worry: Not on file    Inability: Not on file  . Transportation needs:    Medical: Not on file    Non-medical: Not on file  Tobacco Use  . Smoking status: Former Smoker    Packs/day: 0.30    Years: 0.00    Pack years: 0.00    Types: Cigarettes    Last attempt to quit: 06/12/1969    Years since quitting: 48.7  . Smokeless tobacco: Never Used  . 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  Substance and Sexual Activity  . Alcohol use: No    Alcohol/week: 0.0 standard drinks  . Drug use: No  . Sexual activity: Not Currently  Lifestyle  . Physical activity:    Days per week: Not on file    Minutes per session: Not on file  . Stress: Not on file  Relationships  . Social connections:    Talks on phone: Not on file    Gets together: Not on file    Attends  religious service: Not on file    Active member of club or organization: Not on file    Attends meetings of clubs or organizations: Not on file    Relationship status: Not on file  . Intimate partner violence:    Fear of current or ex partner: Not on file    Emotionally abused: Not on file    Physically abused: Not on file    Forced sexual activity: Not on file  Other Topics Concern  . Not on file  Social History Narrative  . Not on file    FH:  Family History  Problem Relation Age of Onset  . Hypertension Father   . Heart attack Father   .  Heart Problems Sister     Past Medical History:  Diagnosis Date  . Acute on chronic respiratory failure ([place])    a. 08/2014 in setting of PE.  Marland Kitchen Ankylosing spondylitis ([place])   . Bilateral pulmonary embolism ([place])    a. 08/2014 - started on Coumadin. Retrievable IVC filter placed 08/27/14 due to RV strain and large clot burden.  Marland Kitchen CAD (coronary artery disease)    a. stenting of LCx 2013; b. STEMI 06/12/14 s/p PCI to LAD complicated by post cath shock requiring IABP; VT s/p DCCV, EF 20%; c. NSTEMI 06/26/14 treated medically.  . Carotid artery disease ([place])    a. s/p stenting.  . Chronic systolic CHF (congestive heart failure) ([place])   . Diabetes ([place])   . Hemoptysis    a. 08/2014 possibly due to pulm infarct/PE.  Marland Kitchen Hyperlipidemia   . Hypertension   . Hypotension   . Ischemic cardiomyopathy    a. echo 08/23/2014 EF <20%, dilated CM, mod MR/TR  . Leukocytosis   . Leukocytosis 09/10/2014  . Nephrolithiasis   . Pleural effusion on right 08/2014 - small  . Reactive thrombocytosis 09/10/2014  . Right leg DVT ([place])    a. 08/2014.  Marland Kitchen Ventricular tachycardia ([place])    a. 06/2014 at time of MI, s/p DCCV.   Current Outpatient Medications  Medication Sig Dispense Refill  . acetaminophen (TYLENOL) 500 MG tablet Take 1,000 mg by mouth every 6 (six) hours as needed for moderate pain.     Marland Kitchen aspirin EC 81 MG tablet Take 1 tablet (81 mg total) by mouth daily. 90 tablet 3  . clopidogrel (PLAVIX) 75 MG tablet Take 1 tablet (75 mg total) by mouth daily. 90 tablet 3  . cyclobenzaprine (FLEXERIL) 5 MG tablet Take 1 tablet (5 mg total) by mouth 3 (three) times daily as needed for muscle spasms. 270 tablet 3  . furosemide (LASIX) 20 MG tablet Every other day alternate 20 mg (1 pill) with 40 mg (2 pills) 135 tablet 3  . pantoprazole (PROTONIX) 40 MG tablet Take 1 tablet (40 mg total) by mouth at bedtime. 90 tablet 3  . potassium chloride SA (K-DUR,KLOR-CON) 20 MEQ tablet Every other day alternate 20 mEq (1 tablet)  with 40 mEq (2 tablets) 135 tablet 3  . rosuvastatin (CRESTOR) 10 MG tablet Take 1 tablet (10 mg total) by mouth at bedtime. 90 tablet 3  . sildenafil (VIAGRA) 100 MG tablet Take 1 tablet (100 mg total) by mouth daily as needed for erectile dysfunction. (Patient taking differently: Take 50 mg by mouth daily as needed for erectile dysfunction. ) 10 tablet 3  . traMADol (ULTRAM) 50 MG tablet Take 2 tablets (100 mg total) by mouth every 6 (six) hours as needed for moderate pain. 120 tablet  3  . traZODone (DESYREL) 100 MG tablet Take 1 tablet (100 mg total) by mouth at bedtime. 90 tablet 6  . warfarin (COUMADIN) 6 MG tablet Take 1 tablet (6 mg) daily except 1 and 1/2 tablets (9 mg) on Friday.    . clobetasol cream (TEMOVATE) 7.71 % Apply 1 application topically 2 (two) times daily as needed (rash).      No current facility-administered medications for this encounter.    Vital Signs:  Doppler Pressure  98               Automatc BP:  102/72 (89) HR: 106 SPO2: 97%  Weight: 194.6  lb w/o eqt Last weight: 196.2 lb Home weights: 186 - 188 lbs   Wt Readings from Last 3 Encounters:  11/11/17 88.3 kg (194 lb 9.6 oz)  09/10/17 89 kg (196 lb 3.2 oz)  08/21/17 84.5 kg (186 lb 4.6 oz)   PHYSICAL EXAM: General:  NAD.  HEENT: normal  Neck: supple. JVP not elevated.  Carotids 2+ bilat; no bruits. No lymphadenopathy or thryomegaly appreciated. Cor: LVAD hum.  Lungs: Clear. Abdomen: soft, nontender, non-distended. No hepatosplenomegaly. No bruits or masses. Good bowel sounds. Driveline site clean. Anchor in place.  Extremities: no cyanosis, clubbing, rash. Warm no edema  Neuro: alert & oriented x 3. No focal deficits. Moves all 4 without problem    ASSESSMENT & PLAN: 1. Chronic systolic HF: Ischemic cardiomyopathy, EF 20% s/p HMII LVAD 09/20/13.   - Remains NYHA I-II with VAD support.  - Volume status looks good.  - Continue lasix 40 mg daily alternating with 20 mg daily. Keeping good track of  daily weights - Refuses transplant evaluation.  2. CAD s/p Anterior STEMI on 06/12/14 with stenting of LAD:  - No s/s of ischemia- .    - Continue statin. Off ASA due to bleeding.  3. VAD:  - Had admit in 08/2017 for elevated LDH.  - LDH looks good at 288 - VAD interrogated personally. Parameters stable. - Off asa. On coumadin.  - INR 2.49. No bleeding. Personally reviewed 4. Ankylosing spondylitis: Off NSAIDS. -  Follows with rheumatology. Uses tramadol. Avoids NSAIDs as much as possible. - Pain has been well controlled. No change to current plan.   5. Gallstone pancreatitis - s/p lap chole 02/21/17. No further.  6. Anticoagulation: - On coumadin.  - INR 2.49. No bleeding. Personally reviewed.   7. HTN:  - MAPs well controlled. Continue current regimen.   Scott Bickers, MD  02/11/2018

## 2018-02-21 ENCOUNTER — Other Ambulatory Visit (HOSPITAL_COMMUNITY): Payer: Self-pay | Admitting: Unknown Physician Specialty

## 2018-02-21 DIAGNOSIS — Z7901 Long term (current) use of anticoagulants: Secondary | ICD-10-CM

## 2018-02-21 DIAGNOSIS — Z95811 Presence of heart assist device: Secondary | ICD-10-CM

## 2018-02-26 ENCOUNTER — Ambulatory Visit (HOSPITAL_COMMUNITY): Payer: Self-pay | Admitting: Pharmacist

## 2018-02-26 ENCOUNTER — Ambulatory Visit (HOSPITAL_COMMUNITY)
Admission: RE | Admit: 2018-02-26 | Discharge: 2018-02-26 | Disposition: A | Discharge status: Home / Self Care | Payer: Medicare Other | Source: Ambulatory Visit | Attending: Internal Medicine | Admitting: Internal Medicine

## 2018-02-26 DIAGNOSIS — Z95811 Presence of heart assist device: Secondary | ICD-10-CM

## 2018-02-26 DIAGNOSIS — Z7901 Long term (current) use of anticoagulants: Secondary | ICD-10-CM | POA: Insufficient documentation

## 2018-02-26 DIAGNOSIS — Z5181 Encounter for therapeutic drug level monitoring: Secondary | ICD-10-CM

## 2018-02-26 LAB — LACTATE DEHYDROGENASE: LDH: 256 U/L — ABNORMAL HIGH (ref 98–192)

## 2018-02-26 LAB — PROTIME-INR
INR: 2.47
PROTHROMBIN TIME: 26.5 s — AB (ref 11.4–15.2)

## 2018-03-07 ENCOUNTER — Other Ambulatory Visit (HOSPITAL_COMMUNITY): Payer: Self-pay | Admitting: Unknown Physician Specialty

## 2018-03-07 DIAGNOSIS — Z95811 Presence of heart assist device: Secondary | ICD-10-CM

## 2018-03-07 DIAGNOSIS — Z7901 Long term (current) use of anticoagulants: Secondary | ICD-10-CM

## 2018-03-12 ENCOUNTER — Ambulatory Visit (HOSPITAL_COMMUNITY): Payer: Self-pay | Admitting: Pharmacist

## 2018-03-12 ENCOUNTER — Ambulatory Visit (HOSPITAL_COMMUNITY)
Admission: RE | Admit: 2018-03-12 | Discharge: 2018-03-12 | Disposition: A | Discharge status: Home / Self Care | Payer: Medicare Other | Source: Ambulatory Visit | Attending: Cardiology | Admitting: Cardiology

## 2018-03-12 DIAGNOSIS — Z95811 Presence of heart assist device: Secondary | ICD-10-CM | POA: Insufficient documentation

## 2018-03-12 DIAGNOSIS — Z7901 Long term (current) use of anticoagulants: Secondary | ICD-10-CM | POA: Diagnosis not present

## 2018-03-12 DIAGNOSIS — Z5181 Encounter for therapeutic drug level monitoring: Secondary | ICD-10-CM

## 2018-03-12 LAB — PROTIME-INR
INR: 2.64
Prothrombin Time: 28 seconds — ABNORMAL HIGH (ref 11.4–15.2)

## 2018-03-12 LAB — LACTATE DEHYDROGENASE: LDH: 255 U/L — AB (ref 98–192)

## 2018-03-26 ENCOUNTER — Ambulatory Visit (HOSPITAL_COMMUNITY)
Admission: RE | Admit: 2018-03-26 | Discharge: 2018-03-26 | Disposition: A | Discharge status: Home / Self Care | Payer: Medicare Other | Source: Ambulatory Visit | Attending: Internal Medicine | Admitting: Internal Medicine

## 2018-03-26 ENCOUNTER — Ambulatory Visit (HOSPITAL_COMMUNITY): Payer: Self-pay | Admitting: Pharmacist

## 2018-03-26 ENCOUNTER — Other Ambulatory Visit (HOSPITAL_COMMUNITY): Payer: Medicare Other

## 2018-03-26 DIAGNOSIS — Z95811 Presence of heart assist device: Secondary | ICD-10-CM | POA: Insufficient documentation

## 2018-03-26 DIAGNOSIS — Z5181 Encounter for therapeutic drug level monitoring: Secondary | ICD-10-CM

## 2018-03-26 LAB — LACTATE DEHYDROGENASE: LDH: 250 U/L — ABNORMAL HIGH (ref 98–192)

## 2018-03-26 LAB — PROTIME-INR
INR: 2.99
Prothrombin Time: 30.8 s — ABNORMAL HIGH (ref 11.4–15.2)

## 2018-04-04 ENCOUNTER — Other Ambulatory Visit (HOSPITAL_COMMUNITY): Payer: Self-pay | Admitting: Unknown Physician Specialty

## 2018-04-04 DIAGNOSIS — Z95811 Presence of heart assist device: Secondary | ICD-10-CM

## 2018-04-04 DIAGNOSIS — Z7901 Long term (current) use of anticoagulants: Secondary | ICD-10-CM

## 2018-04-09 ENCOUNTER — Ambulatory Visit (HOSPITAL_COMMUNITY)
Admission: RE | Admit: 2018-04-09 | Discharge: 2018-04-09 | Disposition: A | Discharge status: Home / Self Care | Payer: Medicare Other | Source: Ambulatory Visit | Attending: Internal Medicine | Admitting: Internal Medicine

## 2018-04-09 ENCOUNTER — Ambulatory Visit (HOSPITAL_COMMUNITY): Payer: Self-pay | Admitting: Pharmacist

## 2018-04-09 ENCOUNTER — Other Ambulatory Visit (HOSPITAL_COMMUNITY): Payer: Self-pay | Admitting: Unknown Physician Specialty

## 2018-04-09 DIAGNOSIS — Z95811 Presence of heart assist device: Secondary | ICD-10-CM

## 2018-04-09 DIAGNOSIS — Z7901 Long term (current) use of anticoagulants: Secondary | ICD-10-CM

## 2018-04-09 DIAGNOSIS — Z5181 Encounter for therapeutic drug level monitoring: Secondary | ICD-10-CM

## 2018-04-09 LAB — PROTIME-INR
INR: 2.57
PROTHROMBIN TIME: 27.4 s — AB (ref 11.4–15.2)

## 2018-04-09 LAB — LACTATE DEHYDROGENASE: LDH: 245 U/L — AB (ref 98–192)

## 2018-04-17 ENCOUNTER — Other Ambulatory Visit (HOSPITAL_COMMUNITY): Payer: Self-pay | Admitting: *Deleted

## 2018-04-17 DIAGNOSIS — Z95811 Presence of heart assist device: Secondary | ICD-10-CM

## 2018-04-17 DIAGNOSIS — Z7901 Long term (current) use of anticoagulants: Secondary | ICD-10-CM

## 2018-04-17 DIAGNOSIS — Z5181 Encounter for therapeutic drug level monitoring: Secondary | ICD-10-CM

## 2018-04-21 ENCOUNTER — Other Ambulatory Visit (HOSPITAL_COMMUNITY): Payer: Self-pay | Admitting: *Deleted

## 2018-04-21 ENCOUNTER — Ambulatory Visit (HOSPITAL_COMMUNITY)
Admission: RE | Admit: 2018-04-21 | Discharge: 2018-04-21 | Disposition: A | Discharge status: Home / Self Care | Payer: Medicare Other | Source: Ambulatory Visit | Attending: Internal Medicine | Admitting: Internal Medicine

## 2018-04-21 DIAGNOSIS — Z7901 Long term (current) use of anticoagulants: Secondary | ICD-10-CM | POA: Diagnosis not present

## 2018-04-21 DIAGNOSIS — Z95811 Presence of heart assist device: Secondary | ICD-10-CM

## 2018-04-21 DIAGNOSIS — Z5181 Encounter for therapeutic drug level monitoring: Secondary | ICD-10-CM | POA: Diagnosis not present

## 2018-04-21 LAB — PROTIME-INR
INR: 3.26
PROTHROMBIN TIME: 32.7 s — AB (ref 11.4–15.2)

## 2018-04-21 LAB — LACTATE DEHYDROGENASE: LDH: 250 U/L — ABNORMAL HIGH (ref 98–192)

## 2018-04-22 ENCOUNTER — Ambulatory Visit (HOSPITAL_COMMUNITY): Payer: Self-pay | Admitting: Pharmacist

## 2018-04-22 DIAGNOSIS — Z95811 Presence of heart assist device: Secondary | ICD-10-CM

## 2018-04-22 DIAGNOSIS — Z5181 Encounter for therapeutic drug level monitoring: Secondary | ICD-10-CM

## 2018-05-04 ENCOUNTER — Encounter (HOSPITAL_COMMUNITY): Payer: Self-pay | Admitting: Emergency Medicine

## 2018-05-04 ENCOUNTER — Emergency Department (HOSPITAL_COMMUNITY)
Admission: EM | Admit: 2018-05-04 | Discharge: 2018-05-04 | Disposition: A | Discharge status: Home / Self Care | Payer: Medicare Other | Attending: Emergency Medicine | Admitting: Emergency Medicine

## 2018-05-04 ENCOUNTER — Emergency Department (HOSPITAL_COMMUNITY): Payer: Medicare Other

## 2018-05-04 DIAGNOSIS — Z95811 Presence of heart assist device: Secondary | ICD-10-CM | POA: Diagnosis not present

## 2018-05-04 DIAGNOSIS — I2510 Atherosclerotic heart disease of native coronary artery without angina pectoris: Secondary | ICD-10-CM | POA: Insufficient documentation

## 2018-05-04 DIAGNOSIS — Z7901 Long term (current) use of anticoagulants: Secondary | ICD-10-CM | POA: Diagnosis not present

## 2018-05-04 DIAGNOSIS — I5022 Chronic systolic (congestive) heart failure: Secondary | ICD-10-CM | POA: Insufficient documentation

## 2018-05-04 DIAGNOSIS — R82998 Other abnormal findings in urine: Secondary | ICD-10-CM

## 2018-05-04 DIAGNOSIS — R319 Hematuria, unspecified: Secondary | ICD-10-CM | POA: Insufficient documentation

## 2018-05-04 DIAGNOSIS — I110 Hypertensive heart disease with heart failure: Secondary | ICD-10-CM | POA: Insufficient documentation

## 2018-05-04 DIAGNOSIS — E119 Type 2 diabetes mellitus without complications: Secondary | ICD-10-CM | POA: Insufficient documentation

## 2018-05-04 DIAGNOSIS — Z87891 Personal history of nicotine dependence: Secondary | ICD-10-CM | POA: Insufficient documentation

## 2018-05-04 DIAGNOSIS — Z79899 Other long term (current) drug therapy: Secondary | ICD-10-CM | POA: Diagnosis not present

## 2018-05-04 DIAGNOSIS — Z7982 Long term (current) use of aspirin: Secondary | ICD-10-CM | POA: Diagnosis not present

## 2018-05-04 DIAGNOSIS — Z7902 Long term (current) use of antithrombotics/antiplatelets: Secondary | ICD-10-CM | POA: Insufficient documentation

## 2018-05-04 DIAGNOSIS — N200 Calculus of kidney: Secondary | ICD-10-CM | POA: Diagnosis not present

## 2018-05-04 LAB — CBC
HCT: 39.5 % (ref 39.0–52.0)
HEMOGLOBIN: 11.6 g/dL — AB (ref 13.0–17.0)
MCH: 24.2 pg — AB (ref 26.0–34.0)
MCHC: 29.4 g/dL — AB (ref 30.0–36.0)
MCV: 82.3 fL (ref 80.0–100.0)
PLATELETS: 415 10*3/uL — AB (ref 150–400)
RBC: 4.8 MIL/uL (ref 4.22–5.81)
RDW: 16.5 % — AB (ref 11.5–15.5)
WBC: 10.6 10*3/uL — ABNORMAL HIGH (ref 4.0–10.5)
nRBC: 0 % (ref 0.0–0.2)

## 2018-05-04 LAB — BASIC METABOLIC PANEL
Anion gap: 11 (ref 5–15)
BUN: 14 mg/dL (ref 8–23)
CALCIUM: 9.4 mg/dL (ref 8.9–10.3)
CHLORIDE: 105 mmol/L (ref 98–111)
CO2: 22 mmol/L (ref 22–32)
CREATININE: 1.19 mg/dL (ref 0.61–1.24)
GFR calc non Af Amer: >60 mL/min (ref >60)
Glucose, Bld: 125 mg/dL — ABNORMAL HIGH (ref 70–99)
Potassium: 4.8 mmol/L (ref 3.5–5.1)
SODIUM: 138 mmol/L (ref 135–145)

## 2018-05-04 LAB — URINALYSIS, MICROSCOPIC (REFLEX)
Bacteria, UA: NONE SEEN
Squamous Epithelial / LPF: NONE SEEN (ref 0–5)
WBC UA: NONE SEEN WBC/hpf (ref 0–5)

## 2018-05-04 LAB — LACTATE DEHYDROGENASE: LDH: 230 U/L — AB (ref 98–192)

## 2018-05-04 LAB — URINALYSIS, ROUTINE W REFLEX MICROSCOPIC
Bilirubin Urine: NEGATIVE
GLUCOSE, UA: NEGATIVE mg/dL
KETONES UR: NEGATIVE mg/dL
Leukocytes, UA: NEGATIVE
Nitrite: NEGATIVE
PROTEIN: NEGATIVE mg/dL
Specific Gravity, Urine: 1.02 (ref 1.005–1.030)
pH: 6 (ref 5.0–8.0)

## 2018-05-04 LAB — PROTIME-INR
INR: 2.99
PROTHROMBIN TIME: 30.6 s — AB (ref 11.4–15.2)

## 2018-05-04 NOTE — ED Triage Notes (Signed)
Pt is an LVAD pt, reports he thought he noticed some blood in his urine yesterday, started noticing it again today so he called dr bensimohn who advised that he come to the ED. Pt denies pain or urinary symptoms

## 2018-05-04 NOTE — ED Notes (Signed)
Rapid Response LVAD battery pack at bedside.

## 2018-05-04 NOTE — Consult Note (Addendum)
Advanced Heart Failure VAD Consult Note   PCP-Cardiologist: Olevia Westervelt Requesting: Davonna Belling, MD (ED)  Reason for Consult: Dark urine  HPI:     Scott [name] is a 72 y/o male with h/o CAD with ankylosing spondylitis, DM2, carotid stenting, nephrolithiasis, severe ischemic CM s/p anterior STEM with VT arrest in 12/15, large PE with pulmonary infarct in 2/15 s/p IVC filter and systolic HF with EF 40%.  He underwent HM II LVAD placement on 09/20/13. He had a prolonged hospital course due to recurrent chest bleeding, severe CO2 retention and RV failure.  In 8/18 admitted with acute gallstone pancreatitis. Underwent lap chole 02/21/17.   Admitted 2/18 with elevated LDH. He was started on bivalirudin. LDH went down [phone].   He called the VAD coordinator on call today with 2 days of dark urine and was asked to come to the ER. Says urine has been brown for 2 days and was worse this am. Denies fevers, chills, flank pain, dysuria or pump alarms. No bleed. Says not drinking or eating as much lately. INR was 3.3 about 1 week ago.   In ER, pump parameters normal. MAPs 94. LDH 230. WBC 10.6 Hgb 11.6. Urine sample in ER slightly dark but yellow  VAD Indication: Destination Therapy patient choice   VAD interrogation & Equipment Management: Speed:9200 Flow:4.2 Power:6.0w PI:5.0  Alarms: no clinical alarms Events: 0-3 PI events per day (stable) No power spikes  Fixed speed 9200 Low speed limit: 8600  VAD interrogated personally. Parameters stable.   Review of Systems: [y] = yes, [ ]  = no   General: Weight gain [ ] ; Weight loss [ ] ; Anorexia [ ] ; Fatigue [ ] ; Fever [ ] ; Chills [ ] ; Weakness [ ]   Cardiac: Chest pain/pressure [ ] ; Resting SOB [ ] ; Exertional SOB [ ] ; Orthopnea [ ] ; Pedal Edema [ ] ; Palpitations [ ] ; Syncope [ ] ; Presyncope [ ] ; Paroxysmal nocturnal dyspnea[ ]   Pulmonary: Cough [ ] ; Wheezing[ ] ; Hemoptysis[ ] ; Sputum [ ] ; Snoring [ ]   GI: Vomiting[ ] ;  Dysphagia[ ] ; Melena[ ] ; Hematochezia [ ] ; Heartburn[ ] ; Abdominal pain [ ] ; Constipation [ ] ; Diarrhea [ ] ; BRBPR [ ]   GU: Hematuria[ ] ; Dysuria [ ] ; Nocturia[ ]   Vascular: Pain in legs with walking [ ] ; Pain in feet with lying flat [ ] ; Non-healing sores [ ] ; Stroke [ ] ; TIA [ ] ; Slurred speech [ ] ;  Neuro: Headaches[ ] ; Vertigo[ ] ; Seizures[ ] ; Paresthesias[ ] ;Blurred vision [ ] ; Diplopia [ ] ; Vision changes [ ]   Ortho/Skin: Arthritis Blue.Reese ]; Joint pain Blue.Reese ]; Muscle pain [ ] ; Joint swelling [ ] ; Back Pain [ ] ; Rash [ ]   Psych: Depression[ ] ; Anxiety[ ]   Heme: Bleeding problems [ ] ; Clotting disorders [ ] ; Anemia [ ]   Endocrine: Diabetes [ ] ; Thyroid dysfunction[ ]     Home Medications Prior to Admission medications   Medication Sig Start Date End Date Taking? Authorizing Provider  acetaminophen (TYLENOL) 500 MG tablet Take 1,000 mg by mouth every 6 (six) hours as needed for moderate pain.     [provider]  aspirin EC 81 MG tablet Take 1 tablet (81 mg total) by mouth daily. 11/12/16   Clegg, Amy D, NP  clobetasol cream (TEMOVATE) 4.40 % Apply 1 application topically 2 (two) times daily as needed (rash).     [provider]  clopidogrel (PLAVIX) 75 MG tablet Take 1 tablet (75 mg total) by mouth daily. 10/04/17   Jamear Carbonneau, Shaune Pascal, MD  cyclobenzaprine (FLEXERIL)  5 MG tablet Take 1 tablet (5 mg total) by mouth 3 (three) times daily as needed for muscle spasms. 07/09/17   Toba Claudio, Shaune Pascal, MD  furosemide (LASIX) 20 MG tablet Every other day alternate 20 mg (1 pill) with 40 mg (2 pills) 07/09/17   Zollie Clemence, Shaune Pascal, MD  pantoprazole (PROTONIX) 40 MG tablet Take 1 tablet (40 mg total) by mouth at bedtime. 07/09/17   Ilea Hilton, Shaune Pascal, MD  potassium chloride SA (K-DUR,KLOR-CON) 20 MEQ tablet Every other day alternate 20 mEq (1 tablet) with 40 mEq (2 tablets) 07/09/17   Aisling Emigh, Shaune Pascal, MD  rosuvastatin (CRESTOR) 10 MG tablet Take 1 tablet (10 mg total) by mouth at bedtime. 07/09/17    Cyruss Arata, Shaune Pascal, MD  sildenafil (VIAGRA) 100 MG tablet Take 1 tablet (100 mg total) by mouth daily as needed for erectile dysfunction. Patient taking differently: Take 50 mg by mouth daily as needed for erectile dysfunction.  09/10/16   Leeba Barbe, Shaune Pascal, MD  traMADol (ULTRAM) 50 MG tablet Take 2 tablets (100 mg total) by mouth every 6 (six) hours as needed for moderate pain. 02/11/18   Tenoch Mcclure, Shaune Pascal, MD  traZODone (DESYREL) 100 MG tablet Take 1 tablet (100 mg total) by mouth at bedtime. 07/09/17   Miyoshi Ligas, Shaune Pascal, MD  warfarin (COUMADIN) 6 MG tablet Take 1 tablet (6 mg) daily except 1 and 1/2 tablets (9 mg) on Friday.    [provider]    Past Medical History: Past Medical History:  Diagnosis Date  . Acute on chronic respiratory failure ([place])    a. 08/2014 in setting of PE.  Marland Kitchen Ankylosing spondylitis ([place])   . Bilateral pulmonary embolism ([place])    a. 08/2014 - started on Coumadin. Retrievable IVC filter placed 08/27/14 due to RV strain and large clot burden.  Marland Kitchen CAD (coronary artery disease)    a. stenting of LCx 2013; b. STEMI 06/12/14 s/p PCI to LAD complicated by post cath shock requiring IABP; VT s/p DCCV, EF 20%; c. NSTEMI 06/26/14 treated medically.  . Carotid artery disease ([place])    a. s/p stenting.  . Chronic systolic CHF (congestive heart failure) ([place])   . Diabetes ([place])   . Hemoptysis    a. 08/2014 possibly due to pulm infarct/PE.  Marland Kitchen Hyperlipidemia   . Hypertension   . Hypotension   . Ischemic cardiomyopathy    a. echo 08/23/2014 EF <20%, dilated CM, mod MR/TR  . Leukocytosis   . Leukocytosis 09/10/2014  . Nephrolithiasis   . Pleural effusion on right 08/2014 - small  . Reactive thrombocytosis 09/10/2014  . Right leg DVT ([place])    a. 08/2014.  Marland Kitchen Ventricular tachycardia ([place])    a. 06/2014 at time of MI, s/p DCCV.    Past Surgical History: Past Surgical History:  Procedure Laterality Date  . BEDSIDE EVACUATION OF HEMATOMA  10/05/2014   Procedure:  EVACUATION OF HEMATOMA;  Surgeon: Ivin Poot, MD;  Location: [place];  Service: Open Heart Surgery;;  . CHOLECYSTECTOMY N/A 02/21/2017   Procedure: LAPAROSCOPIC CHOLECYSTECTOMY;  Surgeon: Rolm Bookbinder, MD;  Location: [place];  Service: General;  Laterality: N/A;  . INSERTION OF IMPLANTABLE LEFT VENTRICULAR ASSIST DEVICE N/A 09/21/2014   Procedure: INSERTION OF IMPLANTABLE LEFT VENTRICULAR ASSIST DEVICE;  Surgeon: Ivin Poot, MD;  Location: [place];  Service: Open Heart Surgery;  Laterality: N/A;  CIRC ARREST  NITRIC OXIDE  . LEFT HEART CATHETERIZATION WITH CORONARY ANGIOGRAM N/A 06/12/2014   Procedure: LEFT HEART  CATHETERIZATION WITH CORONARY ANGIOGRAM;  Surgeon: Lorretta Harp, MD;  Location: [facility] CATH LAB;  Service: Cardiovascular;  Laterality: N/A;  . RIGHT HEART CATHETERIZATION N/A 09/10/2014   Procedure: RIGHT HEART CATH;  Surgeon: [name], MD;  Location: [facility] CATH LAB;  Service: Cardiovascular;  Laterality: N/A;  . RIGHT HEART CATHETERIZATION N/A 09/17/2014   Procedure: RIGHT HEART CATH;  Surgeon: [name], MD;  Location: [facility] CATH LAB;  Service: Cardiovascular;  Laterality: N/A;  . TEE WITHOUT CARDIOVERSION N/A 09/21/2014   Procedure: TRANSESOPHAGEAL ECHOCARDIOGRAM (TEE);  Surgeon: Ivin Poot, MD;  Location: [place];  Service: Open Heart Surgery;  Laterality: N/A;  . TEE WITHOUT CARDIOVERSION N/A 10/05/2014   Procedure: TRANSESOPHAGEAL ECHOCARDIOGRAM (TEE);  Surgeon: Ivin Poot, MD;  Location: [place];  Service: Open Heart Surgery;  Laterality: N/A;  . VIDEO ASSISTED THORACOSCOPY (VATS)/THOROCOTOMY Left 10/06/2014   Procedure: VIDEO ASSISTED THORACOSCOPY (VATS)/THOROCOTOMY;  Surgeon: Ivin Poot, MD;  Location: [place];  Service: Open Heart Surgery;  Laterality: Left;    Family History: Family History  Problem Relation Age of Onset  . Hypertension Father   . Heart attack Father   . Heart Problems Sister     Social History: Social History    Socioeconomic History  . Marital status: Married    Spouse name: Not on file  . Number of children: Not on file  . Years of education: Not on file  . Highest education level: Not on file  Occupational History  . Occupation: retired  Scientific laboratory technician  . Financial resource strain: Not on file  . Food insecurity:    Worry: Not on file    Inability: Not on file  . Transportation needs:    Medical: Not on file    Non-medical: Not on file  Tobacco Use  . Smoking status: Former Smoker    Packs/day: 0.30    Years: 0.00    Pack years: 0.00    Types: Cigarettes    Last attempt to quit: 06/12/1969    Years since quitting: 48.9  . Smokeless tobacco: Never Used  . 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  Substance and Sexual Activity  . Alcohol use: No    Alcohol/week: 0.0 standard drinks  . Drug use: No  . Sexual activity: Not Currently  Lifestyle  . Physical activity:    Days per week: Not on file    Minutes per session: Not on file  . Stress: Not on file  Relationships  . Social connections:    Talks on phone: Not on file    Gets together: Not on file    Attends religious service: Not on file    Active member of club or organization: Not on file    Attends meetings of clubs or organizations: Not on file    Relationship status: Not on file  Other Topics Concern  . Not on file  Social History Narrative  . Not on file    Allergies:  Allergies  Allergen Reactions  . Biopatch [Chlorhexidine] Dermatitis and Rash    Objective:    Vital Signs:   Temp:  [98 F (36.7 C)] 98 F (36.7 C) (11/03 1336) Pulse Rate:  [110] 110 (11/03 1336) Resp:  [15] 15 (11/03 1336) SpO2:  [99 %] 99 % (11/03 1336)   Mean arterial Pressure 94  Physical Exam    General:  Well appearing. No resp difficulty HEENT: Normal Neck: supple. JVP . Carotids  2+ bilat; no bruits. No lymphadenopathy or thyromegaly appreciated. Cor: Mechanical heart sounds with LVAD hum  present. Lungs: Clear Abdomen: soft, nontender, nondistended. No hepatosplenomegaly. No bruits or masses. Good bowel sounds. Driveline: C/D/I; securement device intact and driveline incorporated Extremities: no cyanosis, clubbing, rash, edema Neuro: alert & orientedx3, cranial nerves grossly intact. moves all 4 extremities w/o difficulty. Affect pleasant   Telemetry   NSR 90-100 Personally reviewed   EKG   n/a  Labs    Basic Metabolic Panel: No results for input(s): NA, K, CL, CO2, GLUCOSE, BUN, CREATININE, CALCIUM, MG, PHOS in the last 168 hours.  Liver Function Tests: No results for input(s): AST, ALT, ALKPHOS, BILITOT, PROT, ALBUMIN in the last 168 hours. No results for input(s): LIPASE, AMYLASE in the last 168 hours. No results for input(s): AMMONIA in the last 168 hours.  CBC: Recent Labs  Lab 05/04/18 1338  WBC 10.6*  HGB 11.6*  HCT 39.5  MCV 82.3  PLT 415*    Cardiac Enzymes: No results for input(s): CKTOTAL, CKMB, CKMBINDEX, TROPONINI in the last 168 hours.  BNP: BNP (last 3 results) No results for input(s): BNP in the last 8760 hours.  ProBNP (last 3 results) No results for input(s): PROBNP in the last 8760 hours.   CBG: No results for input(s): GLUCAP in the last 168 hours.  Coagulation Studies: No results for input(s): LABPROT, INR in the last 72 hours.   Assessment/Plan:     1. Dark urine - he has h/o LVAD pump thrombosis but LDH 230 is very reassuring. VAD interrogated personally. Parameters stable.No power spikes on VAD  - urine now appears clear. Ddx includes: volume depletion, hematuria, UTI, kidney stone - currently asymptomatic - will check INR, UA and urine cytology  2. Chronic HF s/p HM-II LVAD - stable NYHA II - volume status looks good - MAPs ok   3. CAD  - stable no s/s ischemia  Await results of UA and INR. If stable can go home and we will f/u in [facility]. D/w DR. Pickering at Bedside.   I reviewed the LVAD  parameters from today, and compared the results to the patient's prior recorded data.  No programming changes were made.  The LVAD is functioning within specified parameters.  The patient performs LVAD self-test daily.  LVAD interrogation was negative for any significant power changes, alarms or PI events/speed drops.  LVAD equipment check completed and is in good working order.  Back-up equipment present.   LVAD education done on emergency procedures and precautions and reviewed exit site care.  Length of Stay: 0  [name], MD 05/04/2018, 2:57 PM  VAD Team Pager [phone] (7am - 7am) +++VAD ISSUES ONLY+++   Advanced Heart Failure Team Pager [phone] (M-F; [place])  Please contact [facility] for night-coverage after hours (4p -7a ) and weekends on [email or web address] for all non- LVAD Issues  Addendum:  UA with large blood. No infection. Will get CT stone protocol to further evaluate. Cytology sent. May need outpatient cysto.  [name], MD  3:42 PM

## 2018-05-04 NOTE — Discharge Instructions (Addendum)
You have a small stone in your renal pelvis (opening from kidney into the ureter). This could potentially be causing the blood in your urine. There is no specific intervention for this. Generally the bleeding from the urogenital tract is pretty minimal although it can look dramatic. Keep taking your medications as prescribed. The plan is to monitor this. If it persists beyond the next week, worsens or you develop other symptoms then you need to be re-evaluated.

## 2018-05-04 NOTE — ED Provider Notes (Addendum)
[place] EMERGENCY DEPARTMENT Provider Note   CSN: [id number] Arrival date & time: 05/04/18  1329     History   Chief Complaint Chief Complaint  Patient presents with  . Hematuria    HPI Scott [name] is a 72 y.o. male.  HPI Patient has dark urine starting yesterday.  States it is brown.  Thinks he thinks it could be blood.  Has not seen actual blood though.  He has an LVAD and is on Coumadin.  No lightheadedness dizziness.  No fevers or chills.  No dysuria.  Around 11 days ago his INR was mildly elevated.  Has had some adjustments since then but has not had a recheck.  Dr. Haroldine Laws is in to see the patient. Past Medical History:  Diagnosis Date  . Acute on chronic respiratory failure ([place])    a. 08/2014 in setting of PE.  Marland Kitchen Ankylosing spondylitis ([place])   . Bilateral pulmonary embolism ([place])    a. 08/2014 - started on Coumadin. Retrievable IVC filter placed 08/27/14 due to RV strain and large clot burden.  Marland Kitchen CAD (coronary artery disease)    a. stenting of LCx 2013; b. STEMI 06/12/14 s/p PCI to LAD complicated by post cath shock requiring IABP; VT s/p DCCV, EF 20%; c. NSTEMI 06/26/14 treated medically.  . Carotid artery disease ([place])    a. s/p stenting.  . Chronic systolic CHF (congestive heart failure) ([place])   . Diabetes ([place])   . Hemoptysis    a. 08/2014 possibly due to pulm infarct/PE.  Marland Kitchen Hyperlipidemia   . Hypertension   . Hypotension   . Ischemic cardiomyopathy    a. echo 08/23/2014 EF <20%, dilated CM, mod MR/TR  . Leukocytosis   . Leukocytosis 09/10/2014  . Nephrolithiasis   . Pleural effusion on right 08/2014 - small  . Reactive thrombocytosis 09/10/2014  . Right leg DVT ([place])    a. 08/2014.  Marland Kitchen Ventricular tachycardia ([place])    a. 06/2014 at time of MI, s/p DCCV.    Patient Active Problem List   Diagnosis Date Noted  . Elevated LDH   . Fungal rash of trunk 03/17/2015  . Right femoral vein DVT ([place]) 01/10/2015  . Chronic anticoagulation  12/02/2014  . Anxiety state 10/11/2014  . Sleep disturbance 10/11/2014  . LVAD (left ventricular assist device) present ([place])   . PAF (paroxysmal atrial fibrillation) ([place])   . CHF (congestive heart failure) ([place]) 09/21/2014  . Palliative care encounter 09/16/2014  . Weakness generalized 09/16/2014  . Encounter for therapeutic drug monitoring 09/08/2014  . Cough with hemoptysis   . Hypoxia   . PE (pulmonary embolism) 08/26/2014  . Pulmonary embolus ([place]) 08/26/2014  . CAD in native artery 06/22/2014  . Chronic systolic heart failure ([place]) 06/22/2014  . Abdominal pain, acute   . STEMI (ST elevation myocardial infarction) ([place]) 06/12/2014  . VT (ventricular tachycardia) ([place])   . ST elevation myocardial infarction involving left anterior descending (LAD) coronary artery [facility])     Past Surgical History:  Procedure Laterality Date  . BEDSIDE EVACUATION OF HEMATOMA  10/05/2014   Procedure: EVACUATION OF HEMATOMA;  Surgeon: Ivin Poot, MD;  Location: [facility];  Service: Open Heart Surgery;;  . CHOLECYSTECTOMY N/A 02/21/2017   Procedure: LAPAROSCOPIC CHOLECYSTECTOMY;  Surgeon: Rolm Bookbinder, MD;  Location: [place] [facility];  Service: General;  Laterality: N/A;  . INSERTION OF IMPLANTABLE LEFT VENTRICULAR ASSIST DEVICE N/A 09/21/2014   Procedure: INSERTION OF IMPLANTABLE LEFT VENTRICULAR ASSIST DEVICE;  Surgeon:  Ivin Poot, MD;  Location: [place];  Service: Open Heart Surgery;  Laterality: N/A;  CIRC ARREST  NITRIC OXIDE  . LEFT HEART CATHETERIZATION WITH CORONARY ANGIOGRAM N/A 06/12/2014   Procedure: LEFT HEART CATHETERIZATION WITH CORONARY ANGIOGRAM;  Surgeon: Lorretta Harp, MD;  Location: [facility] CATH LAB;  Service: Cardiovascular;  Laterality: N/A;  . RIGHT HEART CATHETERIZATION N/A 09/10/2014   Procedure: RIGHT HEART CATH;  Surgeon: [name], MD;  Location: [facility] CATH LAB;  Service: Cardiovascular;  Laterality: N/A;  . RIGHT HEART CATHETERIZATION N/A 09/17/2014   Procedure: RIGHT HEART  CATH;  Surgeon: [name], MD;  Location: [facility] CATH LAB;  Service: Cardiovascular;  Laterality: N/A;  . TEE WITHOUT CARDIOVERSION N/A 09/21/2014   Procedure: TRANSESOPHAGEAL ECHOCARDIOGRAM (TEE);  Surgeon: Ivin Poot, MD;  Location: [place];  Service: Open Heart Surgery;  Laterality: N/A;  . TEE WITHOUT CARDIOVERSION N/A 10/05/2014   Procedure: TRANSESOPHAGEAL ECHOCARDIOGRAM (TEE);  Surgeon: Ivin Poot, MD;  Location: [place];  Service: Open Heart Surgery;  Laterality: N/A;  . VIDEO ASSISTED THORACOSCOPY (VATS)/THOROCOTOMY Left 10/06/2014   Procedure: VIDEO ASSISTED THORACOSCOPY (VATS)/THOROCOTOMY;  Surgeon: Ivin Poot, MD;  Location: [place];  Service: Open Heart Surgery;  Laterality: Left;        Home Medications    Prior to Admission medications   Medication Sig Start Date End Date Taking? Authorizing Provider  acetaminophen (TYLENOL) 500 MG tablet Take 1,000 mg by mouth every 6 (six) hours as needed for moderate pain.    Yes [provider]  aspirin EC 81 MG tablet Take 1 tablet (81 mg total) by mouth daily. 11/12/16  Yes Clegg, Amy D, NP  clobetasol cream (TEMOVATE) 4.00 % Apply 1 application topically 2 (two) times daily as needed (rash).    Yes [provider]  clopidogrel (PLAVIX) 75 MG tablet Take 1 tablet (75 mg total) by mouth daily. 10/04/17  Yes Bensimhon, Shaune Pascal, MD  cyclobenzaprine (FLEXERIL) 5 MG tablet Take 1 tablet (5 mg total) by mouth 3 (three) times daily as needed for muscle spasms. Patient taking differently: Take 5 mg by mouth daily as needed for muscle spasms.  07/09/17  Yes Bensimhon, Shaune Pascal, MD  furosemide (LASIX) 20 MG tablet Every other day alternate 20 mg (1 pill) with 40 mg (2 pills) 07/09/17  Yes Bensimhon, Shaune Pascal, MD  pantoprazole (PROTONIX) 40 MG tablet Take 1 tablet (40 mg total) by mouth at bedtime. 07/09/17  Yes Bensimhon, Shaune Pascal, MD  potassium chloride SA (K-DUR,KLOR-CON) 20 MEQ tablet Every other day alternate 20 mEq (1  tablet) with 40 mEq (2 tablets) 07/09/17  Yes Bensimhon, Shaune Pascal, MD  rosuvastatin (CRESTOR) 10 MG tablet Take 1 tablet (10 mg total) by mouth at bedtime. 07/09/17  Yes Bensimhon, Shaune Pascal, MD  traMADol (ULTRAM) 50 MG tablet Take 2 tablets (100 mg total) by mouth every 6 (six) hours as needed for moderate pain. 02/11/18  Yes Bensimhon, Shaune Pascal, MD  traZODone (DESYREL) 100 MG tablet Take 1 tablet (100 mg total) by mouth at bedtime. 07/09/17  Yes Bensimhon, Shaune Pascal, MD  warfarin (COUMADIN) 6 MG tablet Take 6 mg by mouth See admin instructions. Take 1 tablet (6 mg) daily except 1 and 1/2 tablets (9 mg) on wednesday   Yes [provider]  sildenafil (VIAGRA) 100 MG tablet Take 1 tablet (100 mg total) by mouth daily as needed for erectile dysfunction. Patient taking differently: Take 50 mg by mouth daily as  needed for erectile dysfunction.  09/10/16   Bensimhon, Shaune Pascal, MD    Family History Family History  Problem Relation Age of Onset  . Hypertension Father   . Heart attack Father   . Heart Problems Sister     Social History Social History   Tobacco Use  . Smoking status: Former Smoker    Packs/day: 0.30    Years: 0.00    Pack years: 0.00    Types: Cigarettes    Last attempt to quit: 06/12/1969    Years since quitting: 48.9  . Smokeless tobacco: Never Used  . 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  Substance Use Topics  . Alcohol use: No    Alcohol/week: 0.0 standard drinks  . Drug use: No     Allergies   Biopatch [chlorhexidine]   Review of Systems Review of Systems  Constitutional: Negative for appetite change and fever.  Respiratory: Negative for shortness of breath.   Gastrointestinal: Negative for abdominal pain.  Genitourinary: Negative for decreased urine volume and flank pain.  Musculoskeletal: Negative for back pain.  Neurological: Negative for weakness.  Psychiatric/Behavioral: Negative for confusion.     Physical  Exam Updated Vital Signs Pulse (!) 110   Temp 98 F (36.7 C) (Oral)   Resp 15   SpO2 99%   Physical Exam  Constitutional: He appears well-developed.  HENT:  Head: Normocephalic.  Eyes: Pupils are equal, round, and reactive to light.  Neck: Neck supple.  Cardiovascular:  LVAD hum  Pulmonary/Chest: Effort normal.  Abdominal: There is no tenderness.  Musculoskeletal: He exhibits no tenderness.  Neurological: He is alert.  Skin: Skin is warm.     ED Treatments / Results  Labs (all labs ordered are listed, but only abnormal results are displayed) Labs Reviewed  URINALYSIS, ROUTINE W REFLEX MICROSCOPIC - Abnormal; Notable for the following components:      Result Value   Color, Urine YELLOW (*)    APPearance CLEAR (*)    Hgb urine dipstick LARGE (*)    All other components within normal limits  CBC - Abnormal; Notable for the following components:   WBC 10.6 (*)    Hemoglobin 11.6 (*)    MCH 24.2 (*)    MCHC 29.4 (*)    RDW 16.5 (*)    Platelets 415 (*)    All other components within normal limits  BASIC METABOLIC PANEL - Abnormal; Notable for the following components:   Glucose, Bld 125 (*)    All other components within normal limits  LACTATE DEHYDROGENASE - Abnormal; Notable for the following components:   LDH 230 (*)    All other components within normal limits  PROTIME-INR - Abnormal; Notable for the following components:   Prothrombin Time 30.6 (*)    All other components within normal limits  URINALYSIS, MICROSCOPIC (REFLEX)  CYTOLOGY - NON PAP    EKG None  Radiology No results found.  Procedures Procedures (including critical care time)  Medications Ordered in ED Medications - No data to display   Initial Impression / Assessment and Plan / ED Course  I have reviewed the triage vital signs and the nursing notes.  Pertinent labs & imaging results that were available during my care of the patient were reviewed by me and considered in my medical  decision making (see chart for details).     Patient with dark urine.  Has been seen by cardiology.  Urinalysis and INR pending.  Care  turned over to Dr. Wilson Singer.   3:42 PM Urinalysis and INR returned.  Does have some hematuria.  Discussed with Dr. Haroldine Laws.  Will get stone protocol CT with likely discharge home after   Final Clinical Impressions(s) / ED Diagnoses   Final diagnoses:  Hematuria, unspecified type  LVAD (left ventricular assist device) present [facility])    ED Discharge Orders    None       Davonna Belling, MD 05/04/18 1508    Davonna Belling, MD 05/04/18 [phone]

## 2018-05-04 NOTE — ED Notes (Signed)
Signature pad unavailable.  Pt stable, ambulatory, and verbalizes understanding of d/c instructions.

## 2018-05-06 ENCOUNTER — Telehealth (HOSPITAL_COMMUNITY): Payer: Self-pay | Admitting: *Deleted

## 2018-05-06 ENCOUNTER — Encounter (HOSPITAL_COMMUNITY): Payer: Medicare Other

## 2018-05-06 DIAGNOSIS — N200 Calculus of kidney: Secondary | ICD-10-CM | POA: Diagnosis not present

## 2018-05-06 DIAGNOSIS — R310 Gross hematuria: Secondary | ICD-10-CM | POA: Diagnosis not present

## 2018-05-06 NOTE — Telephone Encounter (Signed)
Spoke with Mickel Baas at [facility] regarding Scott [name] referral to be seen. Per Mickel Baas, office is booked up for the next 4-6 weeks, but they would be able to see him today at 2pm. Faxed requested documents.   Spoke with Scott [name] and notified him of appointment. Patient confirmed that he would be able to go today. Provided patient with [facility] office phone number, and address.   Emerson Monte RN [place] Coordinator  Office: [phone]  24/7 Pager: [phone]

## 2018-05-09 ENCOUNTER — Other Ambulatory Visit (HOSPITAL_COMMUNITY): Payer: Self-pay | Admitting: *Deleted

## 2018-05-09 DIAGNOSIS — Z95811 Presence of heart assist device: Secondary | ICD-10-CM

## 2018-05-09 DIAGNOSIS — Z5181 Encounter for therapeutic drug level monitoring: Secondary | ICD-10-CM

## 2018-05-09 DIAGNOSIS — Z7901 Long term (current) use of anticoagulants: Secondary | ICD-10-CM

## 2018-05-12 ENCOUNTER — Ambulatory Visit (HOSPITAL_COMMUNITY)
Admission: RE | Admit: 2018-05-12 | Discharge: 2018-05-12 | Disposition: A | Discharge status: Home / Self Care | Payer: Medicare Other | Source: Ambulatory Visit | Attending: Cardiology | Admitting: Cardiology

## 2018-05-12 ENCOUNTER — Encounter (HOSPITAL_COMMUNITY): Payer: Self-pay

## 2018-05-12 VITALS — BP 101/76 | HR 106 | Ht 68.0 in | Wt 195.4 lb

## 2018-05-12 DIAGNOSIS — N200 Calculus of kidney: Secondary | ICD-10-CM | POA: Insufficient documentation

## 2018-05-12 DIAGNOSIS — G479 Sleep disorder, unspecified: Secondary | ICD-10-CM | POA: Diagnosis not present

## 2018-05-12 DIAGNOSIS — R0609 Other forms of dyspnea: Secondary | ICD-10-CM

## 2018-05-12 DIAGNOSIS — Z87891 Personal history of nicotine dependence: Secondary | ICD-10-CM | POA: Insufficient documentation

## 2018-05-12 DIAGNOSIS — Z86718 Personal history of other venous thrombosis and embolism: Secondary | ICD-10-CM | POA: Insufficient documentation

## 2018-05-12 DIAGNOSIS — I5023 Acute on chronic systolic (congestive) heart failure: Secondary | ICD-10-CM

## 2018-05-12 DIAGNOSIS — I252 Old myocardial infarction: Secondary | ICD-10-CM | POA: Insufficient documentation

## 2018-05-12 DIAGNOSIS — Z5181 Encounter for therapeutic drug level monitoring: Secondary | ICD-10-CM | POA: Diagnosis not present

## 2018-05-12 DIAGNOSIS — Z95811 Presence of heart assist device: Secondary | ICD-10-CM | POA: Insufficient documentation

## 2018-05-12 DIAGNOSIS — M459 Ankylosing spondylitis of unspecified sites in spine: Secondary | ICD-10-CM | POA: Diagnosis not present

## 2018-05-12 DIAGNOSIS — I110 Hypertensive heart disease with heart failure: Secondary | ICD-10-CM | POA: Diagnosis not present

## 2018-05-12 DIAGNOSIS — E119 Type 2 diabetes mellitus without complications: Secondary | ICD-10-CM | POA: Insufficient documentation

## 2018-05-12 DIAGNOSIS — Z86711 Personal history of pulmonary embolism: Secondary | ICD-10-CM | POA: Diagnosis not present

## 2018-05-12 DIAGNOSIS — I255 Ischemic cardiomyopathy: Secondary | ICD-10-CM | POA: Insufficient documentation

## 2018-05-12 DIAGNOSIS — I5022 Chronic systolic (congestive) heart failure: Secondary | ICD-10-CM | POA: Insufficient documentation

## 2018-05-12 DIAGNOSIS — Z79899 Other long term (current) drug therapy: Secondary | ICD-10-CM | POA: Insufficient documentation

## 2018-05-12 DIAGNOSIS — Z7901 Long term (current) use of anticoagulants: Secondary | ICD-10-CM | POA: Diagnosis not present

## 2018-05-12 DIAGNOSIS — I5041 Acute combined systolic (congestive) and diastolic (congestive) heart failure: Secondary | ICD-10-CM | POA: Diagnosis not present

## 2018-05-12 DIAGNOSIS — E785 Hyperlipidemia, unspecified: Secondary | ICD-10-CM | POA: Insufficient documentation

## 2018-05-12 DIAGNOSIS — Z955 Presence of coronary angioplasty implant and graft: Secondary | ICD-10-CM | POA: Insufficient documentation

## 2018-05-12 DIAGNOSIS — I2510 Atherosclerotic heart disease of native coronary artery without angina pectoris: Secondary | ICD-10-CM | POA: Insufficient documentation

## 2018-05-12 DIAGNOSIS — Z7982 Long term (current) use of aspirin: Secondary | ICD-10-CM | POA: Diagnosis not present

## 2018-05-12 LAB — COMPREHENSIVE METABOLIC PANEL
ALK PHOS: 103 U/L (ref 38–126)
ALT: 15 U/L (ref 0–44)
ANION GAP: 7 (ref 5–15)
AST: 24 U/L (ref 15–41)
Albumin: 4 g/dL (ref 3.5–5.0)
BUN: 15 mg/dL (ref 8–23)
CALCIUM: 9.2 mg/dL (ref 8.9–10.3)
CO2: 24 mmol/L (ref 22–32)
CREATININE: 1.12 mg/dL (ref 0.61–1.24)
Chloride: 107 mmol/L (ref 98–111)
Glucose, Bld: 87 mg/dL (ref 70–99)
Potassium: 3.9 mmol/L (ref 3.5–5.1)
Sodium: 138 mmol/L (ref 135–145)
TOTAL PROTEIN: 8.5 g/dL — AB (ref 6.5–8.1)
Total Bilirubin: 0.7 mg/dL (ref 0.3–1.2)

## 2018-05-12 LAB — CBC
HCT: 38.6 % — ABNORMAL LOW (ref 39.0–52.0)
Hemoglobin: 11.3 g/dL — ABNORMAL LOW (ref 13.0–17.0)
MCH: 24.1 pg — ABNORMAL LOW (ref 26.0–34.0)
MCHC: 29.3 g/dL — ABNORMAL LOW (ref 30.0–36.0)
MCV: 82.5 fL (ref 80.0–100.0)
Platelets: 406 10*3/uL — ABNORMAL HIGH (ref 150–400)
RBC: 4.68 MIL/uL (ref 4.22–5.81)
RDW: 16.9 % — AB (ref 11.5–15.5)
WBC: 9.2 10*3/uL (ref 4.0–10.5)
nRBC: 0 % (ref 0.0–0.2)

## 2018-05-12 LAB — LACTATE DEHYDROGENASE: LDH: 214 U/L — AB (ref 98–192)

## 2018-05-12 LAB — PREALBUMIN: PREALBUMIN: 17.7 mg/dL — AB (ref 18–38)

## 2018-05-12 LAB — PROTIME-INR
INR: 2.7
Prothrombin Time: 28.3 seconds — ABNORMAL HIGH (ref 11.4–15.2)

## 2018-05-12 MED ORDER — WARFARIN SODIUM 6 MG PO TABS: 6.0000 mg | ORAL_TABLET | ORAL | 3 refills | Status: DC

## 2018-05-12 MED ORDER — ROSUVASTATIN CALCIUM 10 MG PO TABS: 10.0000 mg | ORAL_TABLET | Freq: Every day | ORAL | 3 refills | Status: DC

## 2018-05-12 MED ORDER — PANTOPRAZOLE SODIUM 40 MG PO TBEC: 40.0000 mg | DELAYED_RELEASE_TABLET | Freq: Every day | ORAL | 3 refills | Status: DC

## 2018-05-12 NOTE — Progress Notes (Signed)
Patient presents for 3 month  follow up in [facility] today with wife. Pt seen in ED on 05/04/18 after calling VAD pager to report "dark urine".  Reports no problems with VAD equipment or concerns with drive line.  Pt seen in ED with CT diagnosed kidney stone. Appt with [facility] scheduled for 05/06/18.  Pt saw Dr. Tresa Moore on 05/06/18 and was told he does have kidney stones that are too large to pass, but no intervention required unless they become painful or cause hematuria significant for drop in Hgb. Wife says they performed a scope in office and everything else looked fine. They have f/u with Dr. Tresa Moore in 6 months.  Per Darrick Grinder, NP we will be adding monthly CBCs to patient's OP lab schedule to monitor for anemia.   Vital Signs:  Doppler Pressure  90 Automatc BP:  101/76 (87) HR: 106 SPO2: 96%  Weight: 195.4 lb w/o eqt Last weight: 194.2 lbs Home weights: 185 - 188 lbs   VAD Indication: Destination Therapy patient choice    VAD interrogation & Equipment Management: Speed:9200 Flow: 4.7 Power: 5.2w    PI: 6.3  Alarms: none Events: 5 - 10 PI events per day  Fixed speed 9200 Low speed limit: 8600  Primary Controller:  Replace back up battery in 9 months Back up Controller: Pt did not bring to clinic today  Annual Equipment Maintenance on UBC/PM was performed on 08/2017.   I reviewed the LVAD parameters from today and compared the results to the patient's prior recorded data. LVAD interrogation was NEGATIVE for significant power changes, NEGATIVE for clinical alarms and STABLE for PI events/speed drops. No programming changes were made and pump is functioning within specified parameters. Pt is performing daily controller and system monitor self tests along with completing weekly and monthly maintenance for LVAD equipment.  LVAD equipment check completed and is in good working order. Back-up equipment not present.   Exit Site Care: Drive line is being maintained  weekly  by his wife, [name]. Drive line exit site well healed and incorporated. The velour is fully implanted at exit site. Dressing dry and intact. No erythema or drainage. Stabilization device present and accurately applied. Pt denies fever or chills. 24 Sorbaview dressings, 4 daily dressing kits, and  20 anchors were given to the pt today.   Significant Events on VAD Support:  02/21/17> lap chole  Device:none   BP & Labs:  MAP 90 - Doppler is reflecting MAP  Hgb 11.3 - No S/S of bleeding. Specifically denies melena/BRBPR or nosebleeds.  LDH pending; established baseline of 250- 320. Denies tea-colored urine. No power elevations noted on interrogation. We will continue to check this with his INR.    Patient Instructions:  1. No change in meds 2. PharmD will contact you with INR and coumadin dosing 3. Return to [facility] in 3 months  [place] Coordinator   Office: [phone] 24/7 Emergency VAD Pager: [phone]

## 2018-05-12 NOTE — Progress Notes (Signed)
Patient ID: Scott [name], male   DOB: February 11, 1946, 72 y.o.   MRN: [id number]   VAD CLINIC NOTE Patient presents for 3 month  follow up in [facility] today with wife. Pt seen in ED on 05/04/18 after calling VAD pager to report "dark urine".  Reports no problems with VAD equipment or concerns with drive line.  Pt seen in ED with CT diagnosed kidney stone. Appt with [facility] scheduled for 05/06/18.  Pt saw Dr. Tresa Moore on 05/06/18 and was told he does have kidney stones that are too large to pass, but no intervention required unless they become painful or cause hematuria significant for drop in Hgb. Wife says they performed a scope in office and everything else looked fine. They have f/u with Dr. Tresa Moore in 6 months.  Per Darrick Grinder, NP we will be adding monthly CBCs to patient's OP lab schedule to monitor for anemia.   Vital Signs:  Doppler Pressure  90 Automatc BP:  101/76 (87) HR: 106 SPO2: 96%  Weight: 195.4 lb w/o eqt Last weight: 194.2 lbs Home weights: 185 - 188 lbs   VAD Indication: Destination Therapy patient choice    VAD interrogation & Equipment Management: Speed:9200 Flow: 4.7 Power: 5.2w PI: 6.3  Alarms: none Events: 5 - 10 PI events per day  Fixed speed 9200 Low speed limit: 8600  Primary Controller: Replace back up battery in 9 months Back up Controller: Pt did not bring to clinic today  Annual Equipment Maintenance on UBC/PM was performed on 08/2017.  I reviewed the LVAD parameters from todayand compared the results to the patient's prior recorded data.LVAD interrogation was NEGATIVEfor significant power changes, NEGATIVEfor clinicalalarms and STABLEfor PI events/speed drops. No programming changes were madeand pump is functioning within specified parameters. Pt is performing daily controller and system monitor self tests along with completing weekly and monthly maintenance for LVAD equipment.  LVAD equipment check completed and  is in good working order. Back-up equipment not present.   Exit Site Care: Drive line is being maintained weekly by his wife, [name]. Drive line exit site well healed and incorporated. The velour is fully implanted at exit site. Dressing dry and intact. No erythema or drainage. Stabilization device present and accurately applied. Pt denies fever or chills. 24 Sorbaview dressings, 4 daily dressing kits, and  20 anchors were given to the pt today.   Significant Events on VAD Support: 02/21/17> lap chole  Device:none   BP &Labs:  MAP 90- Doppler is reflecting MAP  Hgb 11.3 - No S/S of bleeding. Specifically denies melena/BRBPR or nosebleeds.  LDH pending; established baseline of 250- 320. Denies tea-colored urine. No power elevations noted on interrogation. We will continue to check this with his INR.    Patient Instructions:  1. No change in meds 2. PharmD will contact you with INR and coumadin dosing 3. Return to [facility] in 3 months  [place] Coordinator   Office: [phone] 24/7 Emergency VAD Pager: [phone]         Electronically signed by Lezlie Octave, RN at 05/12/2018 10:50 AM     Primary Cardiologist: Dr Haroldine Laws INR : [facility]  HPI: Scott [name] is a 72 y/o male with h/o CAD with ankylosing spondylitis, DM2, carotid stenting, severe ischemic CM s/p anterior STEM with VT arrest in 12/15, large PE with pulmonary infarct in 2/15 s/p IVC filter and systolic HF with EF 29%.  He underwent HM II LVAD placement on 09/20/13. He had a  prolonged hospital course due to recurrent chest bleeding, severe CO2 retention and RV failure.  In 8/18 admitted with acute gallstone pancreatitis. Underwent lap chole 02/21/17.   Admitted 2/18 with elevated LDH. He was started on bivalirudin. LDH went down [phone].   Today he returns for 3 month follow up. Since the last visit he was evaluated in the ED for hematuria. He has CT that showed stable bilateral kidney  stones that were to large to pass. He was referred to urology and had a cystoscope that was ok. Overall feeling fine. Denies SOB/PND/Orthopnea. Appetite ok. No fever or chills. Denies dysuria. Weight at home has been stable. Taking all medications.    VAD Indication: Destination Therapy patient choice    VAD interrogation & Equipment Management: See vad coordinator note above.  Fixed speed 9200 Low speed limit: 8600 Primary Controller: Replace back up battery in 15 months.  Annual Equipment Maintenance on UBC/PM was performed on 08/2017.  I reviewed the LVAD parameters from todayand compared the results to the patient's prior recorded data.LVAD interrogation was NEGATIVEfor significant power changes, NEGATIVEfor clinicalalarms and STABLEfor PI events/speed drops. No programming changes were madeand pump is functioning within specified parameters. Pt is performing daily controller and system monitor self tests along with completing weekly and monthly maintenance for LVAD equipment.  LVAD equipment check completed and is in good working order. Back-up equipment present.   Review of systems complete and found to be negative unless listed in HPI.    SH:  Social History   Socioeconomic History  . Marital status: Married    Spouse name: Not on file  . Number of children: Not on file  . Years of education: Not on file  . Highest education level: Not on file  Occupational History  . Occupation: retired  Scientific laboratory technician  . Financial resource strain: Not on file  . Food insecurity:    Worry: Not on file    Inability: Not on file  . Transportation needs:    Medical: Not on file    Non-medical: Not on file  Tobacco Use  . Smoking status: Former Smoker    Packs/day: 0.30    Years: 0.00    Pack years: 0.00    Types: Cigarettes    Last attempt to quit: 06/12/1969    Years since quitting: 48.9  . Smokeless tobacco: Never Used  . 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  Substance and Sexual Activity  . Alcohol use: No    Alcohol/week: 0.0 standard drinks  . Drug use: No  . Sexual activity: Not Currently  Lifestyle  . Physical activity:    Days per week: Not on file    Minutes per session: Not on file  . Stress: Not on file  Relationships  . Social connections:    Talks on phone: Not on file    Gets together: Not on file    Attends religious service: Not on file    Active member of club or organization: Not on file    Attends meetings of clubs or organizations: Not on file    Relationship status: Not on file  . Intimate partner violence:    Fear of current or ex partner: Not on file    Emotionally abused: Not on file    Physically abused: Not on file    Forced sexual activity: Not on file  Other Topics Concern  . Not on file  Social History Narrative  .  Not on file    FH:  Family History  Problem Relation Age of Onset  . Hypertension Father   . Heart attack Father   . Heart Problems Sister     Past Medical History:  Diagnosis Date  . Acute on chronic respiratory failure ([place])    a. 08/2014 in setting of PE.  Marland Kitchen Ankylosing spondylitis ([place])   . Bilateral pulmonary embolism ([place])    a. 08/2014 - started on Coumadin. Retrievable IVC filter placed 08/27/14 due to RV strain and large clot burden.  Marland Kitchen CAD (coronary artery disease)    a. stenting of LCx 2013; b. STEMI 06/12/14 s/p PCI to LAD complicated by post cath shock requiring IABP; VT s/p DCCV, EF 20%; c. NSTEMI 06/26/14 treated medically.  . Carotid artery disease ([place])    a. s/p stenting.  . Chronic systolic CHF (congestive heart failure) ([place])   . Diabetes ([place])   . Hemoptysis    a. 08/2014 possibly due to pulm infarct/PE.  Marland Kitchen Hyperlipidemia   . Hypertension   . Hypotension   . Ischemic cardiomyopathy    a. echo 08/23/2014 EF <20%, dilated CM, mod MR/TR  . Leukocytosis   . Leukocytosis 09/10/2014  . Nephrolithiasis   . Pleural effusion on right 08/2014 - small   . Reactive thrombocytosis 09/10/2014  . Right leg DVT ([place])    a. 08/2014.  Marland Kitchen Ventricular tachycardia ([place])    a. 06/2014 at time of MI, s/p DCCV.   Current Outpatient Medications  Medication Sig Dispense Refill  . acetaminophen (TYLENOL) 500 MG tablet Take 1,000 mg by mouth every 6 (six) hours as needed for moderate pain.     Marland Kitchen aspirin EC 81 MG tablet Take 1 tablet (81 mg total) by mouth daily. 90 tablet 3  . clobetasol cream (TEMOVATE) 8.84 % Apply 1 application topically 2 (two) times daily as needed (rash).     . clopidogrel (PLAVIX) 75 MG tablet Take 1 tablet (75 mg total) by mouth daily. 90 tablet 3  . cyclobenzaprine (FLEXERIL) 5 MG tablet Take 1 tablet (5 mg total) by mouth 3 (three) times daily as needed for muscle spasms. (Patient taking differently: Take 5 mg by mouth daily as needed for muscle spasms. ) 270 tablet 3  . furosemide (LASIX) 20 MG tablet Every other day alternate 20 mg (1 pill) with 40 mg (2 pills) 135 tablet 3  . pantoprazole (PROTONIX) 40 MG tablet Take 1 tablet (40 mg total) by mouth at bedtime. 90 tablet 3  . potassium chloride SA (K-DUR,KLOR-CON) 20 MEQ tablet Every other day alternate 20 mEq (1 tablet) with 40 mEq (2 tablets) 135 tablet 3  . rosuvastatin (CRESTOR) 10 MG tablet Take 1 tablet (10 mg total) by mouth at bedtime. 90 tablet 3  . sildenafil (VIAGRA) 100 MG tablet Take 1 tablet (100 mg total) by mouth daily as needed for erectile dysfunction. (Patient not taking: Reported on 05/04/2018) 10 tablet 3  . traMADol (ULTRAM) 50 MG tablet Take 2 tablets (100 mg total) by mouth every 6 (six) hours as needed for moderate pain. 120 tablet 3  . traZODone (DESYREL) 100 MG tablet Take 1 tablet (100 mg total) by mouth at bedtime. 90 tablet 6  . warfarin (COUMADIN) 6 MG tablet Take 6 mg by mouth See admin instructions. Take 1 tablet (6 mg) daily except 1 and 1/2 tablets (9 mg) on wednesday     No current facility-administered medications for this encounter.    Vital  Signs:  Doppler Pressure  98               Automatc BP:  102/72 (89) HR: 106 SPO2: 97%  Weight: 194.6  lb w/o eqt Last weight: 196.2 lb Home weights: 186 - 188 lbs   Wt Readings from Last 3 Encounters:  02/11/18 88.1 kg (194 lb 3.2 oz)  11/11/17 88.3 kg (194 lb 9.6 oz)  09/10/17 89 kg (196 lb 3.2 oz)   Physical Exam: GENERAL: Well appearing, male who presents to clinic today in no acute distress. HEENT: normal  NECK: Supple, JVP 5-6   .  2+ bilaterally, no bruits.  No lymphadenopathy or thyromegaly appreciated.   CARDIAC:  Mechanical heart sounds with LVAD hum present.  LUNGS:  Clear to auscultation bilaterally.  ABDOMEN:  Soft, round, nontender, positive bowel sounds x4.     LVAD exit site: well-healed and incorporated.  Dressing dry and intact.  No erythema or drainage.  Stabilization device present and accurately applied.  Driveline dressing is being changed daily per sterile technique. EXTREMITIES:  Warm and dry, no cyanosis, clubbing, rash or edema  NEUROLOGIC:  Alert and oriented x 4.  Gait steady.  No aphasia.  No dysarthria.  Affect pleasant.      ASSESSMENT & PLAN: 1. Chronic systolic HF: Ischemic cardiomyopathy, EF 20% s/p HMII LVAD 09/20/13.   -NYHA I with VAD support.  - Volume status stable.  Continue current dose of lasix.   - Refuses transplant evaluation.  2. CAD s/p Anterior STEMI on 06/12/14 with stenting of LAD:  -No S/S ischemia. .    - Continue statin. Off ASA due to bleeding.  3. VAD:  - Had admit in 08/2017 for elevated LDH.  - LDH stable 214.  - VAD interrogated personally. Parameters stable. - Off asa. On coumadin.  - INR stable 2.7.  4. Ankylosing spondylitis: Off NSAIDS. -  Follows with rheumatology. Uses tramadol. Avoids NSAIDs as much as possible. - Pain has been well controlled. No change to current plan.   5. Gallstone pancreatitis - s/p lap chole 02/21/17. No further.  6. Anticoagulation: - On coumadin.  - INR 2.7   No bleeding.  Personally reviewed.   7. HTN:  - Stable. No change.  8. Kidney Stones  CT with renal calculi bilaterally. No intervention needed at this time.  Now followed by Dr Tresa Moore at [facility]. Plan for 6 month follow up with cystoscope.  - Check CBC every few weeks to make sure hgb not dropping.   Check CBC, BMET, INR, LDH Today . Coumadin dosing discussed with pharmacy.   Follow up in 3 months.    Darrick Grinder, NP  05/12/2018

## 2018-05-26 ENCOUNTER — Other Ambulatory Visit (HOSPITAL_COMMUNITY): Payer: Self-pay | Admitting: Unknown Physician Specialty

## 2018-05-26 DIAGNOSIS — Z95811 Presence of heart assist device: Secondary | ICD-10-CM

## 2018-05-26 DIAGNOSIS — Z7901 Long term (current) use of anticoagulants: Secondary | ICD-10-CM

## 2018-05-27 ENCOUNTER — Ambulatory Visit (HOSPITAL_COMMUNITY)
Admission: RE | Admit: 2018-05-27 | Discharge: 2018-05-27 | Disposition: A | Discharge status: Home / Self Care | Payer: Medicare Other | Source: Ambulatory Visit | Attending: Internal Medicine | Admitting: Internal Medicine

## 2018-05-27 ENCOUNTER — Ambulatory Visit (HOSPITAL_COMMUNITY): Payer: Self-pay | Admitting: Pharmacist

## 2018-05-27 DIAGNOSIS — Z5181 Encounter for therapeutic drug level monitoring: Secondary | ICD-10-CM

## 2018-05-27 DIAGNOSIS — Z7901 Long term (current) use of anticoagulants: Secondary | ICD-10-CM

## 2018-05-27 DIAGNOSIS — Z95811 Presence of heart assist device: Secondary | ICD-10-CM

## 2018-05-27 LAB — CBC
HCT: 37.3 % — ABNORMAL LOW (ref 39.0–52.0)
Hemoglobin: 10.9 g/dL — ABNORMAL LOW (ref 13.0–17.0)
MCH: 24.1 pg — ABNORMAL LOW (ref 26.0–34.0)
MCHC: 29.2 g/dL — ABNORMAL LOW (ref 30.0–36.0)
MCV: 82.5 fL (ref 80.0–100.0)
NRBC: 0 % (ref 0.0–0.2)
PLATELETS: 427 10*3/uL — AB (ref 150–400)
RBC: 4.52 MIL/uL (ref 4.22–5.81)
RDW: 17.2 % — AB (ref 11.5–15.5)
WBC: 10.5 10*3/uL (ref 4.0–10.5)

## 2018-05-27 LAB — PROTIME-INR
INR: 2.49
PROTHROMBIN TIME: 26.6 s — AB (ref 11.4–15.2)

## 2018-05-27 LAB — LACTATE DEHYDROGENASE: LDH: 213 U/L — AB (ref 98–192)

## 2018-06-09 ENCOUNTER — Other Ambulatory Visit (HOSPITAL_COMMUNITY): Payer: Self-pay | Admitting: Unknown Physician Specialty

## 2018-06-09 DIAGNOSIS — Z7901 Long term (current) use of anticoagulants: Secondary | ICD-10-CM

## 2018-06-09 DIAGNOSIS — Z95811 Presence of heart assist device: Secondary | ICD-10-CM

## 2018-06-11 ENCOUNTER — Ambulatory Visit (HOSPITAL_COMMUNITY)
Admission: RE | Admit: 2018-06-11 | Discharge: 2018-06-11 | Disposition: A | Discharge status: Home / Self Care | Payer: Medicare Other | Source: Ambulatory Visit | Attending: Internal Medicine | Admitting: Internal Medicine

## 2018-06-11 ENCOUNTER — Ambulatory Visit (HOSPITAL_COMMUNITY): Payer: Self-pay | Admitting: Pharmacist

## 2018-06-11 DIAGNOSIS — Z5181 Encounter for therapeutic drug level monitoring: Secondary | ICD-10-CM

## 2018-06-11 DIAGNOSIS — Z7901 Long term (current) use of anticoagulants: Secondary | ICD-10-CM | POA: Insufficient documentation

## 2018-06-11 DIAGNOSIS — Z95811 Presence of heart assist device: Secondary | ICD-10-CM | POA: Diagnosis not present

## 2018-06-11 LAB — CBC
HCT: 38.4 % — ABNORMAL LOW (ref 39.0–52.0)
Hemoglobin: 10.8 g/dL — ABNORMAL LOW (ref 13.0–17.0)
MCH: 23.1 pg — ABNORMAL LOW (ref 26.0–34.0)
MCHC: 28.1 g/dL — AB (ref 30.0–36.0)
MCV: 82.2 fL (ref 80.0–100.0)
PLATELETS: 434 10*3/uL — AB (ref 150–400)
RBC: 4.67 MIL/uL (ref 4.22–5.81)
RDW: 17.3 % — ABNORMAL HIGH (ref 11.5–15.5)
WBC: 7.4 10*3/uL (ref 4.0–10.5)
nRBC: 0 % (ref 0.0–0.2)

## 2018-06-11 LAB — LACTATE DEHYDROGENASE: LDH: 226 U/L — ABNORMAL HIGH (ref 98–192)

## 2018-06-11 LAB — PROTIME-INR
INR: 2.76
PROTHROMBIN TIME: 28.8 s — AB (ref 11.4–15.2)

## 2018-06-11 NOTE — Addendum Note (Signed)
Encounter addended by: Christinia [place], RN on: 06/11/2018 9:54 AM  Actions taken: Order list changed, Diagnosis association updated

## 2018-06-14 ENCOUNTER — Emergency Department (HOSPITAL_COMMUNITY)
Admission: EM | Admit: 2018-06-14 | Discharge: 2018-06-14 | Disposition: A | Discharge status: Home / Self Care | Payer: Medicare Other | Attending: Emergency Medicine | Admitting: Emergency Medicine

## 2018-06-14 ENCOUNTER — Encounter (HOSPITAL_COMMUNITY): Payer: Self-pay

## 2018-06-14 DIAGNOSIS — I5022 Chronic systolic (congestive) heart failure: Secondary | ICD-10-CM | POA: Insufficient documentation

## 2018-06-14 DIAGNOSIS — I110 Hypertensive heart disease with heart failure: Secondary | ICD-10-CM | POA: Insufficient documentation

## 2018-06-14 DIAGNOSIS — E119 Type 2 diabetes mellitus without complications: Secondary | ICD-10-CM | POA: Diagnosis not present

## 2018-06-14 DIAGNOSIS — Z7901 Long term (current) use of anticoagulants: Secondary | ICD-10-CM | POA: Diagnosis not present

## 2018-06-14 DIAGNOSIS — K068 Other specified disorders of gingiva and edentulous alveolar ridge: Secondary | ICD-10-CM | POA: Insufficient documentation

## 2018-06-14 DIAGNOSIS — Z79899 Other long term (current) drug therapy: Secondary | ICD-10-CM | POA: Diagnosis not present

## 2018-06-14 DIAGNOSIS — Z7982 Long term (current) use of aspirin: Secondary | ICD-10-CM | POA: Insufficient documentation

## 2018-06-14 DIAGNOSIS — Z7902 Long term (current) use of antithrombotics/antiplatelets: Secondary | ICD-10-CM | POA: Insufficient documentation

## 2018-06-14 DIAGNOSIS — I2510 Atherosclerotic heart disease of native coronary artery without angina pectoris: Secondary | ICD-10-CM | POA: Diagnosis not present

## 2018-06-14 DIAGNOSIS — Z87891 Personal history of nicotine dependence: Secondary | ICD-10-CM | POA: Diagnosis not present

## 2018-06-14 DIAGNOSIS — R000 Tachycardia, unspecified: Secondary | ICD-10-CM | POA: Diagnosis not present

## 2018-06-14 DIAGNOSIS — R9431 Abnormal electrocardiogram [ECG] [EKG]: Secondary | ICD-10-CM | POA: Diagnosis not present

## 2018-06-14 LAB — CBC WITH DIFFERENTIAL/PLATELET
Abs Immature Granulocytes: 0.04 10*3/uL (ref 0.00–0.07)
Basophils Absolute: 0.1 10*3/uL (ref 0.0–0.1)
Basophils Relative: 1 %
Eosinophils Absolute: 0.1 10*3/uL (ref 0.0–0.5)
Eosinophils Relative: 1 %
HCT: 36.8 % — ABNORMAL LOW (ref 39.0–52.0)
Hemoglobin: 10.8 g/dL — ABNORMAL LOW (ref 13.0–17.0)
IMMATURE GRANULOCYTES: 0 %
Lymphocytes Relative: 5 %
Lymphs Abs: 0.6 10*3/uL — ABNORMAL LOW (ref 0.7–4.0)
MCH: 23.6 pg — ABNORMAL LOW (ref 26.0–34.0)
MCHC: 29.3 g/dL — ABNORMAL LOW (ref 30.0–36.0)
MCV: 80.5 fL (ref 80.0–100.0)
Monocytes Absolute: 0.6 10*3/uL (ref 0.1–1.0)
Monocytes Relative: 5 %
Neutro Abs: 9.8 10*3/uL — ABNORMAL HIGH (ref 1.7–7.7)
Neutrophils Relative %: 88 %
Platelets: 392 10*3/uL (ref 150–400)
RBC: 4.57 MIL/uL (ref 4.22–5.81)
RDW: 17.3 % — ABNORMAL HIGH (ref 11.5–15.5)
WBC: 11.1 10*3/uL — ABNORMAL HIGH (ref 4.0–10.5)
nRBC: 0 % (ref 0.0–0.2)

## 2018-06-14 LAB — BASIC METABOLIC PANEL
ANION GAP: 11 (ref 5–15)
BUN: 15 mg/dL (ref 8–23)
CO2: 21 mmol/L — ABNORMAL LOW (ref 22–32)
Calcium: 9.1 mg/dL (ref 8.9–10.3)
Chloride: 104 mmol/L (ref 98–111)
Creatinine, Ser: 1.01 mg/dL (ref 0.61–1.24)
GFR calc Af Amer: >60 mL/min (ref >60)
GFR calc non Af Amer: >60 mL/min (ref >60)
GLUCOSE: 193 mg/dL — AB (ref 70–99)
POTASSIUM: 4.2 mmol/L (ref 3.5–5.1)
Sodium: 136 mmol/L (ref 135–145)

## 2018-06-14 LAB — TYPE AND SCREEN
ABO/RH(D): A POS
Antibody Screen: NEGATIVE

## 2018-06-14 LAB — PROTIME-INR
INR: 2.27
Prothrombin Time: 24.8 seconds — ABNORMAL HIGH (ref 11.4–15.2)

## 2018-06-14 MED ORDER — "TRANEXAMIC ACID 5% ORAL SOLUTION "
10.0000 mL | Freq: Once | ORAL | Status: AC
  Administered 2018-06-14: 10 mL via OROMUCOSAL
  Filled 2018-06-14: qty 10

## 2018-06-14 NOTE — ED Provider Notes (Signed)
[facility]ERGENCY DEPARTMENT Provider Note   CSN: [id number] Arrival date & time: 06/14/18  0759     History   Chief Complaint Chief Complaint  Patient presents with  . Bleeding/Bruising    HPI Scott [name] is a 72 y.o. male with LVAD, warfarin use presenting today with upper gingival bleeding that began at 5 PM yesterday.  Patient denies injury or trauma or associated pain.  Patient states that bleeding has been sparse but constant since 5 PM, states that the blood has clotted on his upper gingiva however still has occasional small amount of bleeding.  Patient had CBC, LDH and PT/INR checked on 06/11/2018 all of which are to be at patient's baseline.  Patient is without additional complaint today denies chest pain, denies concern with LVAD or additional concerns at this time.  Patient denies any other bleeding.  HPI  Past Medical History:  Diagnosis Date  . Acute on chronic respiratory failure ([place])    a. 08/2014 in setting of PE.  Marland Kitchen Ankylosing spondylitis ([place])   . Bilateral pulmonary embolism ([place])    a. 08/2014 - started on Coumadin. Retrievable IVC filter placed 08/27/14 due to RV strain and large clot burden.  Marland Kitchen CAD (coronary artery disease)    a. stenting of LCx 2013; b. STEMI 06/12/14 s/p PCI to LAD complicated by post cath shock requiring IABP; VT s/p DCCV, EF 20%; c. NSTEMI 06/26/14 treated medically.  . Carotid artery disease ([place])    a. s/p stenting.  . Chronic systolic CHF (congestive heart failure) ([place])   . Diabetes ([place])   . Hemoptysis    a. 08/2014 possibly due to pulm infarct/PE.  Marland Kitchen Hyperlipidemia   . Hypertension   . Hypotension   . Ischemic cardiomyopathy    a. echo 08/23/2014 EF <20%, dilated CM, mod MR/TR  . Leukocytosis   . Leukocytosis 09/10/2014  . Nephrolithiasis   . Pleural effusion on right 08/2014 - small  . Reactive thrombocytosis 09/10/2014  . Right leg DVT ([place])    a. 08/2014.  Marland Kitchen Ventricular tachycardia ([place])    a. 06/2014 at time of MI, s/p DCCV.    Patient Active Problem List   Diagnosis Date Noted  . Elevated LDH   . Fungal rash of trunk 03/17/2015  . Right femoral vein DVT ([place]) 01/10/2015  . Chronic anticoagulation 12/02/2014  . Anxiety state 10/11/2014  . Sleep disturbance 10/11/2014  . LVAD (left ventricular assist device) present ([place])   . PAF (paroxysmal atrial fibrillation) ([place])   . CHF (congestive heart failure) ([place]) 09/21/2014  . Palliative care encounter 09/16/2014  . Weakness generalized 09/16/2014  . Encounter for therapeutic drug monitoring 09/08/2014  . Cough with hemoptysis   . Hypoxia   . PE (pulmonary embolism) 08/26/2014  . Pulmonary embolus ([place]) 08/26/2014  . CAD in native artery 06/22/2014  . Chronic systolic heart failure ([place]) 06/22/2014  . Abdominal pain, acute   . STEMI (ST elevation myocardial infarction) ([place]) 06/12/2014  . VT (ventricular tachycardia) ([place])   . ST elevation myocardial infarction involving left anterior descending (LAD) coronary artery [facility])     Past Surgical History:  Procedure Laterality Date  . BEDSIDE EVACUATION OF HEMATOMA  10/05/2014   Procedure: EVACUATION OF HEMATOMA;  Surgeon: Ivin Poot, MD;  Location: [facility];  Service: Open Heart Surgery;;  . CHOLECYSTECTOMY N/A 02/21/2017   Procedure: LAPAROSCOPIC CHOLECYSTECTOMY;  Surgeon: Rolm Bookbinder, MD;  Location: [facility];  Service: General;  Laterality: N/A;  .  INSERTION OF IMPLANTABLE LEFT VENTRICULAR ASSIST DEVICE N/A 09/21/2014   Procedure: INSERTION OF IMPLANTABLE LEFT VENTRICULAR ASSIST DEVICE;  Surgeon: Ivin Poot, MD;  Location: [facility];  Service: Open Heart Surgery;  Laterality: N/A;  CIRC ARREST  NITRIC OXIDE  . LEFT HEART CATHETERIZATION WITH CORONARY ANGIOGRAM N/A 06/12/2014   Procedure: LEFT HEART CATHETERIZATION WITH CORONARY ANGIOGRAM;  Surgeon: Lorretta Harp, MD;  Location: [facility] CATH LAB;  Service: Cardiovascular;  Laterality: N/A;  . RIGHT HEART CATHETERIZATION  N/A 09/10/2014   Procedure: RIGHT HEART CATH;  Surgeon: [name], MD;  Location: [facility] CATH LAB;  Service: Cardiovascular;  Laterality: N/A;  . RIGHT HEART CATHETERIZATION N/A 09/17/2014   Procedure: RIGHT HEART CATH;  Surgeon: [name], MD;  Location: [facility] CATH LAB;  Service: Cardiovascular;  Laterality: N/A;  . TEE WITHOUT CARDIOVERSION N/A 09/21/2014   Procedure: TRANSESOPHAGEAL ECHOCARDIOGRAM (TEE);  Surgeon: Ivin Poot, MD;  Location: [facility];  Service: Open Heart Surgery;  Laterality: N/A;  . TEE WITHOUT CARDIOVERSION N/A 10/05/2014   Procedure: TRANSESOPHAGEAL ECHOCARDIOGRAM (TEE);  Surgeon: Ivin Poot, MD;  Location: [facility];  Service: Open Heart Surgery;  Laterality: N/A;  . VIDEO ASSISTED THORACOSCOPY (VATS)/THOROCOTOMY Left 10/06/2014   Procedure: VIDEO ASSISTED THORACOSCOPY (VATS)/THOROCOTOMY;  Surgeon: Ivin Poot, MD;  Location: [facility];  Service: Open Heart Surgery;  Laterality: Left;        Home Medications    Prior to Admission medications   Medication Sig Start Date End Date Taking? Authorizing Provider  acetaminophen (TYLENOL) 500 MG tablet Take 1,000 mg by mouth every 6 (six) hours as needed for moderate pain.    Yes [provider]  aspirin EC 81 MG tablet Take 1 tablet (81 mg total) by mouth daily. 11/12/16  Yes Clegg, Amy D, NP  clopidogrel (PLAVIX) 75 MG tablet Take 1 tablet (75 mg total) by mouth daily. 10/04/17  Yes Bensimhon, Shaune Pascal, MD  cyclobenzaprine (FLEXERIL) 5 MG tablet Take 1 tablet (5 mg total) by mouth 3 (three) times daily as needed for muscle spasms. Patient taking differently: Take 5 mg by mouth daily as needed for muscle spasms.  07/09/17  Yes Bensimhon, Shaune Pascal, MD  furosemide (LASIX) 20 MG tablet Every other day alternate 20 mg (1 pill) with 40 mg (2 pills) Patient taking differently: Take 20-40 mg by mouth See admin instructions. Every other day alternate 20 mg (1 pill) with 40 mg (2 pills) 07/09/17  Yes Bensimhon, Shaune Pascal, MD    pantoprazole (PROTONIX) 40 MG tablet Take 1 tablet (40 mg total) by mouth at bedtime. 05/12/18  Yes Clegg, Amy D, NP  potassium chloride SA (K-DUR,KLOR-CON) 20 MEQ tablet Every other day alternate 20 mEq (1 tablet) with 40 mEq (2 tablets) Patient taking differently: Take 20-40 mEq by mouth See admin instructions. Every other day alternate 20 mEq (1 tablet) with 40 mEq (2 tablets) 07/09/17  Yes Bensimhon, Shaune Pascal, MD  rosuvastatin (CRESTOR) 10 MG tablet Take 1 tablet (10 mg total) by mouth at bedtime. 05/12/18  Yes Clegg, Amy D, NP  traMADol (ULTRAM) 50 MG tablet Take 2 tablets (100 mg total) by mouth every 6 (six) hours as needed for moderate pain. Patient taking differently: Take 50 mg by mouth every 6 (six) hours as needed for moderate pain.  02/11/18  Yes Bensimhon, Shaune Pascal, MD  traZODone (DESYREL) 100 MG tablet Take 1 tablet (100 mg total) by mouth at bedtime. 07/09/17  Yes Bensimhon, Shaune Pascal, MD  warfarin (COUMADIN) 6 MG tablet Take 1 tablet (6 mg total) by mouth See admin instructions. Take 1 tablet (6 mg) daily except 1 and 1/2 tablets (9 mg) on wednesday Patient taking differently: Take 6 mg by mouth daily.  05/12/18  Yes Clegg, Amy D, NP  sildenafil (VIAGRA) 100 MG tablet Take 1 tablet (100 mg total) by mouth daily as needed for erectile dysfunction. Patient not taking: Reported on 06/14/2018 09/10/16   Bensimhon, Shaune Pascal, MD    Family History Family History  Problem Relation Age of Onset  . Hypertension Father   . Heart attack Father   . Heart Problems Sister     Social History Social History   Tobacco Use  . Smoking status: Former Smoker    Packs/day: 0.30    Years: 0.00    Pack years: 0.00    Types: Cigarettes    Last attempt to quit: 06/12/1969    Years since quitting: 49.0  . Smokeless tobacco: Never Used  . 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  Substance Use Topics  . Alcohol use: No    Alcohol/week: 0.0 standard drinks  . Drug use: No      Allergies   Biopatch [chlorhexidine]   Review of Systems Review of Systems  Constitutional: Negative.  Negative for chills and fever.  Respiratory: Negative.  Negative for cough and shortness of breath.   Cardiovascular: Negative.  Negative for chest pain.  Gastrointestinal: Negative.  Negative for abdominal pain, nausea and vomiting.  Skin: Negative.  Negative for color change and wound.  Neurological: Negative.  Negative for dizziness, syncope, weakness, light-headedness and headaches.  Hematological: Bruises/bleeds easily (Gingival bleeding).  All other systems reviewed and are negative.  Physical Exam Updated Vital Signs Pulse 73   Temp 98.2 F (36.8 C) (Oral)   Resp 18   SpO2 97%   Physical Exam Constitutional:      General: He is not in acute distress.    Appearance: He is well-developed.  HENT:     Head: Normocephalic and atraumatic.     Right Ear: External ear normal.     Left Ear: External ear normal.     Nose: Nose normal.     Mouth/Throat:     Pharynx: Oropharynx is clear. Uvula midline.      Comments: Superior gingival bleeding present, blood clot present to anterior upper. Eyes:     General: Vision grossly intact. Gaze aligned appropriately.     Pupils: Pupils are equal, round, and reactive to light.  Neck:     Musculoskeletal: Full passive range of motion without pain, normal range of motion and neck supple.     Trachea: Trachea normal. No tracheal deviation.  Cardiovascular:     Comments: LVAD in place, machine hum present. Pulmonary:     Effort: Pulmonary effort is normal. No respiratory distress.     Breath sounds: Normal breath sounds and air entry.  Chest:     Chest wall: No deformity, tenderness or crepitus.  Abdominal:     Palpations: Abdomen is soft.     Tenderness: There is no abdominal tenderness. There is no guarding or rebound.  Musculoskeletal: Normal range of motion.     Comments: Patient moving all extremities spontaneously  without difficulty.  Steady gait without assistance.  Skin:    General: Skin is warm and dry.     Capillary Refill: Capillary refill takes less than 2 seconds.  Neurological:  Mental Status: He is alert.     GCS: GCS eye subscore is 4. GCS verbal subscore is 5. GCS motor subscore is 6.     Comments: Speech is clear and goal oriented, follows commands Major Cranial nerves without deficit, no facial droop Moves extremities without ataxia, coordination intact Normal gait  Psychiatric:        Behavior: Behavior normal.     ED Treatments / Results  Labs (all labs ordered are listed, but only abnormal results are displayed) Labs Reviewed  CBC WITH DIFFERENTIAL/PLATELET - Abnormal; Notable for the following components:      Result Value   WBC 11.1 (*)    Hemoglobin 10.8 (*)    HCT 36.8 (*)    MCH 23.6 (*)    MCHC 29.3 (*)    RDW 17.3 (*)    Neutro Abs 9.8 (*)    Lymphs Abs 0.6 (*)    All other components within normal limits  BASIC METABOLIC PANEL - Abnormal; Notable for the following components:   CO2 21 (*)    Glucose, Bld 193 (*)    All other components within normal limits  PROTIME-INR - Abnormal; Notable for the following components:   Prothrombin Time 24.8 (*)    All other components within normal limits  TYPE AND SCREEN    EKG EKG Interpretation  Date/Time:  Saturday June 14 2018 08:10:30 EST Ventricular Rate:  113 PR Interval:    QRS Duration: 132 QT Interval:  347 QTC Calculation: 472 R Axis:   -102 Text Interpretation:  Sinus tachycardia Multiform ventricular premature complexes Probable left atrial enlargement Nonspecific IVCD with LAD Probable inferior infarct, old Abnormal lateral Q waves Confirmed by Dene Gentry [phone]) on 06/14/2018 8:15:08 AM   Radiology No results found.  Procedures Procedures (including critical care time)  Medications Ordered in ED Medications  tranexamic acid 5% oral solution for E.D. (10 mLs Mouth/Throat Given  06/14/18 1035)     Initial Impression / Assessment and Plan / ED Course  I have reviewed the triage vital signs and the nursing notes.  Pertinent labs & imaging results that were available during my care of the patient were reviewed by me and considered in my medical decision making (see chart for details).    52:64 AM: 72-year-old male with LVAD and warfarin use presenting today for upper gingival bleeding, clot present to upper gingival margin, well-appearing and in no acute distress.  No other bleeding or complaints at this time.  No symptoms of anemia at this time.  CBC, BMP, PT/INR and type and screen has been ordered. Case discussed with Dr. Francia Greaves who agrees with work-up at this time. ------------------------------------------ Hemoglobin 10.8, baseline per previous lab results INR 2.27 Prothrombin time 24.8 Platelet count 392 EKG without acute findings reviewed by Dr. Francia Greaves ------------------------------------------- 9:15 AM: Discussion with LVAD team, Sarah, discussed case and results.  She advises that patient may be discharged at this time to follow-up outpatient clinic.  Patient being seen by Dr. Francia Greaves. -------------------------------------------- BMP nonacute Type and screen a positive  Patient given TXA mouth rinse by nursing staff.  Following rinse patient was reevaluated, no gingival bleeding is present.  Patient states that he is feeling well and wishes to be discharged at this time.  At discharge patient is afebrile, not tachycardic, well-appearing and in no acute distress.  No active bleeding present.  At this time there does not appear to be any evidence of an acute emergency medical condition and the patient appears  stable for discharge with appropriate outpatient follow up. Diagnosis was discussed with patient who verbalizes understanding of care plan and is agreeable to discharge. I have discussed return precautions with patient and wife at bedside who verbalize  understanding of return precautions. Patient strongly encouraged to follow-up with their PCP next week, LVAD clinic on Monday and the dentist next week. All questions answered.  Patient was independently seen and evaluated by Dr. Francia Greaves during this visit who agrees with plan of care, discharge and follow-up outpatient at this time.  Note: Portions of this report may have been transcribed using voice recognition software. Every effort was made to ensure accuracy; however, inadvertent computerized transcription errors may still be present.  Final Clinical Impressions(s) / ED Diagnoses   Final diagnoses:  Gingival bleeding    ED Discharge Orders    None       Gari Crown 06/14/18 1053    Valarie Merino, MD 06/15/18 [phone]

## 2018-06-14 NOTE — ED Triage Notes (Addendum)
Pt to ER with c/o bleeding from top portion of mouth; pt denies CP and states he has chronic issues with SOB; pt stated that bleeding started at 5p yesterday and been continuous since then; pt is on Coumadin daily; states that his INR was 2.7 on Wed

## 2018-06-14 NOTE — ED Notes (Signed)
Pt discharged from ED; instructions provided; Pt encouraged to return to ED if symptoms worsen and to f/u with PCP; Pt verbalized understanding of all instructions 

## 2018-06-14 NOTE — Discharge Instructions (Signed)
You have been diagnosed today with gingival bleeding.  At this time there does not appear to be the presence of an emergent medical condition, however there is always the potential for conditions to change. Please read and follow the below instructions.  Please return to the Emergency Department immediately for any new or worsening symptoms. Please be sure to follow up with your Primary Care Provider next week regarding your visit today; please call their office to schedule an appointment even if you are feeling better for a follow-up visit. Please follow-up with your LVAD specialist on Monday. Please follow-up with a dentist next week for further evaluation of your gingiva. Please avoid flossing or rough brushing of the teeth or gums for the next few days.  Get help right away if: You are very weak. You are short of breath. You have pain in your abdomen or chest. You are dizzy or feel faint. You have bleeding that does not stop You have trouble concentrating. You have bloody or black, tarry stools. You vomit repeatedly or you vomit up blood. Any other new or concerning symptoms  Please read the additional information packets attached to your discharge summary.  Do not take your medicine if  develop an itchy rash, swelling in your mouth or lips, or difficulty breathing.

## 2018-06-17 ENCOUNTER — Telehealth (HOSPITAL_COMMUNITY): Payer: Self-pay

## 2018-06-17 ENCOUNTER — Telehealth (HOSPITAL_COMMUNITY): Payer: Self-pay | Admitting: Unknown Physician Specialty

## 2018-06-17 NOTE — Telephone Encounter (Signed)
OPENED IN ERROR

## 2018-06-17 NOTE — Telephone Encounter (Signed)
Request for information received from [facility] in reference to medication Cyclobenzaprine 5mg  TID.  PA requested as Baclofen non formulary. Called [employer], on formulary are Baclofen and Tizanidine HCL if provider wants to try an alternative.  Can call [employer] at [phone].

## 2018-06-20 ENCOUNTER — Other Ambulatory Visit (HOSPITAL_COMMUNITY): Payer: Self-pay | Admitting: *Deleted

## 2018-06-20 DIAGNOSIS — Z7901 Long term (current) use of anticoagulants: Secondary | ICD-10-CM

## 2018-06-20 DIAGNOSIS — Z95811 Presence of heart assist device: Secondary | ICD-10-CM

## 2018-06-26 ENCOUNTER — Ambulatory Visit (HOSPITAL_COMMUNITY): Payer: Self-pay | Admitting: Pharmacist

## 2018-06-26 ENCOUNTER — Ambulatory Visit (HOSPITAL_COMMUNITY)
Admission: RE | Admit: 2018-06-26 | Discharge: 2018-06-26 | Disposition: A | Discharge status: Home / Self Care | Payer: Medicare Other | Source: Ambulatory Visit | Attending: Internal Medicine | Admitting: Internal Medicine

## 2018-06-26 DIAGNOSIS — Z95811 Presence of heart assist device: Secondary | ICD-10-CM

## 2018-06-26 DIAGNOSIS — Z7901 Long term (current) use of anticoagulants: Secondary | ICD-10-CM | POA: Diagnosis not present

## 2018-06-26 DIAGNOSIS — Z5181 Encounter for therapeutic drug level monitoring: Secondary | ICD-10-CM

## 2018-06-26 LAB — PROTIME-INR
INR: 2.79
Prothrombin Time: 29 seconds — ABNORMAL HIGH (ref 11.4–15.2)

## 2018-06-26 LAB — LACTATE DEHYDROGENASE: LDH: 240 U/L — ABNORMAL HIGH (ref 98–192)

## 2018-07-04 ENCOUNTER — Other Ambulatory Visit (HOSPITAL_COMMUNITY): Payer: Self-pay | Admitting: Unknown Physician Specialty

## 2018-07-04 DIAGNOSIS — Z95811 Presence of heart assist device: Secondary | ICD-10-CM

## 2018-07-04 DIAGNOSIS — Z7901 Long term (current) use of anticoagulants: Secondary | ICD-10-CM

## 2018-07-10 ENCOUNTER — Ambulatory Visit (HOSPITAL_COMMUNITY)
Admission: RE | Admit: 2018-07-10 | Discharge: 2018-07-10 | Disposition: A | Discharge status: Home / Self Care | Payer: Medicare Other | Source: Ambulatory Visit | Attending: Internal Medicine | Admitting: Internal Medicine

## 2018-07-10 ENCOUNTER — Ambulatory Visit (HOSPITAL_COMMUNITY): Payer: Self-pay | Admitting: Pharmacist

## 2018-07-10 DIAGNOSIS — Z95811 Presence of heart assist device: Secondary | ICD-10-CM

## 2018-07-10 DIAGNOSIS — Z5181 Encounter for therapeutic drug level monitoring: Secondary | ICD-10-CM

## 2018-07-10 DIAGNOSIS — Z7901 Long term (current) use of anticoagulants: Secondary | ICD-10-CM | POA: Insufficient documentation

## 2018-07-10 LAB — PROTIME-INR
INR: 2.85
Prothrombin Time: 29.5 seconds — ABNORMAL HIGH (ref 11.4–15.2)

## 2018-07-10 LAB — LACTATE DEHYDROGENASE: LDH: 245 U/L — ABNORMAL HIGH (ref 98–192)

## 2018-07-18 ENCOUNTER — Other Ambulatory Visit (HOSPITAL_COMMUNITY): Payer: Self-pay | Admitting: Unknown Physician Specialty

## 2018-07-18 DIAGNOSIS — Z7901 Long term (current) use of anticoagulants: Secondary | ICD-10-CM

## 2018-07-18 DIAGNOSIS — Z95811 Presence of heart assist device: Secondary | ICD-10-CM

## 2018-07-23 ENCOUNTER — Ambulatory Visit (HOSPITAL_COMMUNITY): Payer: Self-pay | Admitting: Pharmacist

## 2018-07-23 ENCOUNTER — Ambulatory Visit (HOSPITAL_COMMUNITY)
Admission: RE | Admit: 2018-07-23 | Discharge: 2018-07-23 | Disposition: A | Discharge status: Home / Self Care | Payer: Medicare Other | Source: Ambulatory Visit | Attending: Cardiology | Admitting: Cardiology

## 2018-07-23 DIAGNOSIS — Z5181 Encounter for therapeutic drug level monitoring: Secondary | ICD-10-CM

## 2018-07-23 DIAGNOSIS — Z95811 Presence of heart assist device: Secondary | ICD-10-CM

## 2018-07-23 DIAGNOSIS — Z7901 Long term (current) use of anticoagulants: Secondary | ICD-10-CM | POA: Diagnosis not present

## 2018-07-23 LAB — LACTATE DEHYDROGENASE: LDH: 309 U/L — ABNORMAL HIGH (ref 98–192)

## 2018-07-23 LAB — PROTIME-INR
INR: 2.53
Prothrombin Time: 26.9 seconds — ABNORMAL HIGH (ref 11.4–15.2)

## 2018-07-25 ENCOUNTER — Other Ambulatory Visit (HOSPITAL_COMMUNITY): Payer: Self-pay | Admitting: Unknown Physician Specialty

## 2018-07-25 DIAGNOSIS — Z7901 Long term (current) use of anticoagulants: Secondary | ICD-10-CM

## 2018-07-25 DIAGNOSIS — Z95811 Presence of heart assist device: Secondary | ICD-10-CM

## 2018-07-30 ENCOUNTER — Ambulatory Visit (HOSPITAL_COMMUNITY): Payer: Self-pay | Admitting: Pharmacist

## 2018-07-30 ENCOUNTER — Ambulatory Visit (HOSPITAL_COMMUNITY)
Admission: RE | Admit: 2018-07-30 | Discharge: 2018-07-30 | Disposition: A | Discharge status: Home / Self Care | Payer: Medicare Other | Source: Ambulatory Visit | Attending: Internal Medicine | Admitting: Internal Medicine

## 2018-07-30 DIAGNOSIS — Z95811 Presence of heart assist device: Secondary | ICD-10-CM | POA: Diagnosis not present

## 2018-07-30 DIAGNOSIS — Z7901 Long term (current) use of anticoagulants: Secondary | ICD-10-CM

## 2018-07-30 DIAGNOSIS — Z5181 Encounter for therapeutic drug level monitoring: Secondary | ICD-10-CM

## 2018-07-30 LAB — PROTIME-INR
INR: 2.53
PROTHROMBIN TIME: 26.9 s — AB (ref 11.4–15.2)

## 2018-07-30 LAB — LACTATE DEHYDROGENASE: LDH: 401 U/L — ABNORMAL HIGH (ref 98–192)

## 2018-08-01 ENCOUNTER — Other Ambulatory Visit (HOSPITAL_COMMUNITY): Payer: Self-pay | Admitting: Unknown Physician Specialty

## 2018-08-01 DIAGNOSIS — Z95811 Presence of heart assist device: Secondary | ICD-10-CM

## 2018-08-01 DIAGNOSIS — Z7901 Long term (current) use of anticoagulants: Secondary | ICD-10-CM

## 2018-08-04 ENCOUNTER — Ambulatory Visit (HOSPITAL_COMMUNITY): Payer: Self-pay | Admitting: Pharmacist

## 2018-08-04 ENCOUNTER — Ambulatory Visit (HOSPITAL_COMMUNITY)
Admission: RE | Admit: 2018-08-04 | Discharge: 2018-08-04 | Disposition: A | Discharge status: Home / Self Care | Payer: Medicare Other | Source: Ambulatory Visit | Attending: Internal Medicine | Admitting: Internal Medicine

## 2018-08-04 DIAGNOSIS — I5023 Acute on chronic systolic (congestive) heart failure: Secondary | ICD-10-CM

## 2018-08-04 DIAGNOSIS — Z45010 Encounter for checking and testing of cardiac pacemaker pulse generator [battery]: Secondary | ICD-10-CM | POA: Diagnosis not present

## 2018-08-04 DIAGNOSIS — I480 Paroxysmal atrial fibrillation: Secondary | ICD-10-CM

## 2018-08-04 DIAGNOSIS — G479 Sleep disorder, unspecified: Secondary | ICD-10-CM

## 2018-08-04 DIAGNOSIS — Z7901 Long term (current) use of anticoagulants: Secondary | ICD-10-CM | POA: Diagnosis not present

## 2018-08-04 DIAGNOSIS — Z79899 Other long term (current) drug therapy: Secondary | ICD-10-CM

## 2018-08-04 DIAGNOSIS — I5041 Acute combined systolic (congestive) and diastolic (congestive) heart failure: Secondary | ICD-10-CM

## 2018-08-04 DIAGNOSIS — R0609 Other forms of dyspnea: Secondary | ICD-10-CM | POA: Diagnosis not present

## 2018-08-04 DIAGNOSIS — Z95811 Presence of heart assist device: Secondary | ICD-10-CM

## 2018-08-04 DIAGNOSIS — I5022 Chronic systolic (congestive) heart failure: Secondary | ICD-10-CM | POA: Diagnosis not present

## 2018-08-04 DIAGNOSIS — Z5181 Encounter for therapeutic drug level monitoring: Secondary | ICD-10-CM

## 2018-08-04 LAB — CBC
HEMATOCRIT: 37 % — AB (ref 39.0–52.0)
Hemoglobin: 10.5 g/dL — ABNORMAL LOW (ref 13.0–17.0)
MCH: 23.2 pg — ABNORMAL LOW (ref 26.0–34.0)
MCHC: 28.4 g/dL — ABNORMAL LOW (ref 30.0–36.0)
MCV: 81.9 fL (ref 80.0–100.0)
Platelets: 427 10*3/uL — ABNORMAL HIGH (ref 150–400)
RBC: 4.52 MIL/uL (ref 4.22–5.81)
RDW: 18.6 % — AB (ref 11.5–15.5)
WBC: 9.1 10*3/uL (ref 4.0–10.5)
nRBC: 0 % (ref 0.0–0.2)

## 2018-08-04 LAB — BASIC METABOLIC PANEL
Anion gap: 9 (ref 5–15)
BUN: 19 mg/dL (ref 8–23)
CALCIUM: 9.2 mg/dL (ref 8.9–10.3)
CO2: 23 mmol/L (ref 22–32)
Chloride: 109 mmol/L (ref 98–111)
Creatinine, Ser: 1.21 mg/dL (ref 0.61–1.24)
GFR calc Af Amer: >60 mL/min (ref >60)
GLUCOSE: 93 mg/dL (ref 70–99)
Potassium: 4.2 mmol/L (ref 3.5–5.1)
Sodium: 141 mmol/L (ref 135–145)

## 2018-08-04 LAB — LACTATE DEHYDROGENASE: LDH: 375 U/L — ABNORMAL HIGH (ref 98–192)

## 2018-08-04 LAB — PROTIME-INR
INR: 2.67
Prothrombin Time: 28 seconds — ABNORMAL HIGH (ref 11.4–15.2)

## 2018-08-04 MED ORDER — TRAZODONE HCL 100 MG PO TABS: 100.0000 mg | ORAL_TABLET | Freq: Every day | ORAL | 6 refills | Status: DC

## 2018-08-04 MED ORDER — FUROSEMIDE 20 MG PO TABS: ORAL_TABLET | ORAL | 3 refills | Status: DC

## 2018-08-04 MED ORDER — CYCLOBENZAPRINE HCL 5 MG PO TABS: 5.0000 mg | ORAL_TABLET | Freq: Three times a day (TID) | ORAL | 3 refills | Status: DC | PRN

## 2018-08-04 MED ORDER — POTASSIUM CHLORIDE CRYS ER 20 MEQ PO TBCR: EXTENDED_RELEASE_TABLET | ORAL | 3 refills | Status: DC

## 2018-08-04 MED ORDER — CLOPIDOGREL BISULFATE 75 MG PO TABS: 75.0000 mg | ORAL_TABLET | Freq: Every day | ORAL | 3 refills | Status: DC

## 2018-08-04 NOTE — Progress Notes (Signed)
Patient ID: Scott [name], male   DOB: Aug 19, 1945, 73 y.o.   MRN: [id number]     Primary Cardiologist: Dr Haroldine Laws INR : [facility]  HPI: Scott [name] is a 73 y/o male with h/o CAD with ankylosing spondylitis, DM2, carotid stenting, severe ischemic CM s/p anterior STEM with VT arrest in 12/15, large PE with pulmonary infarct in 2/15 s/p IVC filter and systolic HF with EF 71%.  He underwent HM II LVAD placement on 09/20/13. He had a prolonged hospital course due to recurrent chest bleeding, severe CO2 retention and RV failure.  In 8/18 admitted with acute gallstone pancreatitis. Underwent lap chole 02/21/17.   Admitted 2/18 with elevated LDH. He was started on bivalirudin. LDH went down [phone].   Today he returns for 3 month VAD follow up. Overall feeling fine. Mild dyspnea with exertion but no change. He has noticed increased flow with exercise. Denies SOB/PND/Orthopnea. No bleeding issues. Appetite ok. No fever or chills. Weight at home 186-188 pounds. Taking all medications. No VAD alarms. No neuro symptoms.    VAD Indication: Destination Therapy patient choice    VAD interrogation & Equipment Management: Speed:9200 Flow: 4.8 Power: 5.3w PI: 6.0  Alarms: none Events: 10 - 15 PI events per day  Fixed speed 9200 Low speed limit: 8600  Review of systems complete and found to be negative unless listed in HPI.    SH:  Social History   Socioeconomic History  . Marital status: Married    Spouse name: Not on file  . Number of children: Not on file  . Years of education: Not on file  . Highest education level: Not on file  Occupational History  . Occupation: retired  Scientific laboratory technician  . Financial resource strain: Not on file  . Food insecurity:    Worry: Not on file    Inability: Not on file  . Transportation needs:    Medical: Not on file    Non-medical: Not on file  Tobacco Use  . Smoking status: Former Smoker    Packs/day: 0.30    Years: 0.00    Pack  years: 0.00    Types: Cigarettes    Last attempt to quit: 06/12/1969    Years since quitting: 49.1  . Smokeless tobacco: Never Used  . 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  Substance and Sexual Activity  . Alcohol use: No    Alcohol/week: 0.0 standard drinks  . Drug use: No  . Sexual activity: Not Currently  Lifestyle  . Physical activity:    Days per week: Not on file    Minutes per session: Not on file  . Stress: Not on file  Relationships  . Social connections:    Talks on phone: Not on file    Gets together: Not on file    Attends religious service: Not on file    Active member of club or organization: Not on file    Attends meetings of clubs or organizations: Not on file    Relationship status: Not on file  . Intimate partner violence:    Fear of current or ex partner: Not on file    Emotionally abused: Not on file    Physically abused: Not on file    Forced sexual activity: Not on file  Other Topics Concern  . Not on file  Social History Narrative  . Not on file    FH:  Family History  Problem Relation Age of Onset  .  Hypertension Father   . Heart attack Father   . Heart Problems Sister     Past Medical History:  Diagnosis Date  . Acute on chronic respiratory failure ([place])    a. 08/2014 in setting of PE.  Marland Kitchen Ankylosing spondylitis ([place])   . Bilateral pulmonary embolism ([place])    a. 08/2014 - started on Coumadin. Retrievable IVC filter placed 08/27/14 due to RV strain and large clot burden.  Marland Kitchen CAD (coronary artery disease)    a. stenting of LCx 2013; b. STEMI 06/12/14 s/p PCI to LAD complicated by post cath shock requiring IABP; VT s/p DCCV, EF 20%; c. NSTEMI 06/26/14 treated medically.  . Carotid artery disease ([place])    a. s/p stenting.  . Chronic systolic CHF (congestive heart failure) ([place])   . Diabetes ([place])   . Hemoptysis    a. 08/2014 possibly due to pulm infarct/PE.  Marland Kitchen Hyperlipidemia   . Hypertension   . Hypotension   .  Ischemic cardiomyopathy    a. echo 08/23/2014 EF <20%, dilated CM, mod MR/TR  . Leukocytosis   . Leukocytosis 09/10/2014  . Nephrolithiasis   . Pleural effusion on right 08/2014 - small  . Reactive thrombocytosis 09/10/2014  . Right leg DVT ([place])    a. 08/2014.  Marland Kitchen Ventricular tachycardia ([place])    a. 06/2014 at time of MI, s/p DCCV.   Current Outpatient Medications  Medication Sig Dispense Refill  . acetaminophen (TYLENOL) 500 MG tablet Take 1,000 mg by mouth every 6 (six) hours as needed for moderate pain.     Marland Kitchen aspirin EC 81 MG tablet Take 1 tablet (81 mg total) by mouth daily. 90 tablet 3  . clopidogrel (PLAVIX) 75 MG tablet Take 1 tablet (75 mg total) by mouth daily. 90 tablet 3  . cyclobenzaprine (FLEXERIL) 5 MG tablet Take 1 tablet (5 mg total) by mouth 3 (three) times daily as needed for muscle spasms. 270 tablet 3  . furosemide (LASIX) 20 MG tablet Every other day alternate 20 mg (1 pill) with 40 mg (2 pills) 135 tablet 3  . pantoprazole (PROTONIX) 40 MG tablet Take 1 tablet (40 mg total) by mouth at bedtime. 90 tablet 3  . potassium chloride SA (K-DUR,KLOR-CON) 20 MEQ tablet Every other day alternate 20 mEq (1 tablet) with 40 mEq (2 tablets) 135 tablet 3  . rosuvastatin (CRESTOR) 10 MG tablet Take 1 tablet (10 mg total) by mouth at bedtime. 90 tablet 3  . traMADol (ULTRAM) 50 MG tablet Take 2 tablets (100 mg total) by mouth every 6 (six) hours as needed for moderate pain. (Patient taking differently: Take 50 mg by mouth every 6 (six) hours as needed for moderate pain. ) 120 tablet 3  . traZODone (DESYREL) 100 MG tablet Take 1 tablet (100 mg total) by mouth at bedtime. 90 tablet 6  . warfarin (COUMADIN) 6 MG tablet Take 1 tablet (6 mg total) by mouth See admin instructions. Take 1 tablet (6 mg) daily except 1 and 1/2 tablets (9 mg) on wednesday (Patient taking differently: Take 6 mg by mouth daily. Warfarin 9mg  Wed and 6mg  all other days) 120 tablet 3  . sildenafil (VIAGRA) 100 MG tablet  Take 1 tablet (100 mg total) by mouth daily as needed for erectile dysfunction. (Patient not taking: Reported on 06/14/2018) 10 tablet 3   No current facility-administered medications for this encounter.    Vital Signs:  Doppler Pressure: 94 Automatc BP:  100/73 (84) HR: 99 SPO2: 96%  Weight:  196.2 lb w/o eqt Last weight: 195.4 lbs Home weights: 185 - 188 lbs   Wt Readings from Last 3 Encounters:  08/04/18 89 kg (196 lb 3.2 oz)  05/12/18 88.6 kg (195 lb 6.4 oz)  02/11/18 88.1 kg (194 lb 3.2 oz)   Physical Exam: GENERAL: Well appearing, male who presents to clinic today in no acute distress. HEENT: normal  NECK: Supple, JVP 5-6.  2+ bilaterally, no bruits.  No lymphadenopathy or thyromegaly appreciated.   CARDIAC:  Mechanical heart sounds with LVAD hum present.  LUNGS:  Clear to auscultation bilaterally.  ABDOMEN:  Soft, round, nontender, positive bowel sounds x4.     LVAD exit site: well-healed and incorporated.  Dressing dry and intact.  No erythema or drainage.  Stabilization device present and accurately applied.  Driveline dressing is being changed daily per sterile technique. EXTREMITIES:  Warm and dry, no cyanosis, clubbing, rash or edema  NEUROLOGIC:  Alert and oriented x 4.  Gait steady.  No aphasia.  No dysarthria.  Affect pleasant.     ASSESSMENT & PLAN: 1. Chronic systolic HF: Ischemic cardiomyopathy, EF 20% s/p HMII LVAD 09/20/13.   - NYHA I-II with VAD support.   - Volume status stable.  Continue current dose of lasix.   - Refuses transplant evaluation.  2. CAD s/p Anterior STEMI on 06/12/14 with stenting of LAD:  No s/s ischemia.    - Continue statin. Off ASA due to bleeding.  3. VAD:  - Had admit in 08/2017 for elevated LDH.  - LDH down from previous 401>375 - VAD interrogated personally. Parameters stable.  - Off asa. On coumadin.  - INR stable 2.67.   4. Ankylosing spondylitis: Off NSAIDS. -  Follows with rheumatology. Uses tramadol. Avoids NSAIDs as  much as possible. - Pain has been well controlled. No change to current plan.   5. Gallstone pancreatitis - s/p lap chole 02/21/17. No further.  6. Anticoagulation: - On coumadin.  -INR stable 2.7.  -No bleeding.  7. HTN:  Stable.  8. Kidney Stones  CT with renal calculi bilaterally. No intervention needed at this time.  Now followed by Dr Tresa Moore at [facility]. Plan for 6 month follow up with cystoscope.   Follow up in 3 months. Discussed potential s/s of pump thrombosis.    Darrick Grinder, NP  08/04/2018   Patient seen and examined with the above-signed Advanced Practice Provider and/or Housestaff. I personally reviewed laboratory data, imaging studies and relevant notes. I independently examined the patient and formulated the important aspects of the plan. I have edited the note to reflect any of my changes or salient points. I have personally discussed the plan with the patient and/or family.  Overall stable. NYHA II. Volume status looks good. LDH trending down slowly. No clinical evidence of pump thrombosis. No dark urine or power spikes on pump. MAPs ok. VAD interrogated personally. Parameters stable. INR 2.7. Discussed dosing with PharmD personally.  Total time spent 35 minutes. Over half that time spent discussing above.   [name], MD  1:06 PM

## 2018-08-04 NOTE — Progress Notes (Signed)
Patient presents for 3 month  follow up in [facility] today with wife. Reports no problems with VAD equipment or concerns with drive line.  Pt states that he has been feeling good since his last visit. He states that his controllers beeping has gotten much quieter and he has trouble hearing it when his batteries are low. Self test performed and the speaker on the back of controller checked and is clean. We may change the controller pending pts LDH.    Vital Signs:  Doppler Pressure: 94 Automatc BP:  100/73 (84) HR: 99 SPO2: 96%  Weight: 196.2 lb w/o eqt Last weight: 195.4 lbs Home weights: 185 - 188 lbs   VAD Indication: Destination Therapy patient choice    VAD interrogation & Equipment Management: Speed:9200 Flow: 4.8 Power: 5.3w    PI: 6.0  Alarms: none Events: 10 - 15 PI events per day  Fixed speed 9200 Low speed limit: 8600  Primary Controller: Controller changed to [id number] today. Pt tolerated well. Back-up battery expiring in 26 mo Back up Controller: Back up battery changed today in clinic. Expiring in 26 mo  Annual Equipment Maintenance on UBC/PM was performed on 08/2017.   I reviewed the LVAD parameters from today and compared the results to the patient's prior recorded data. LVAD interrogation was NEGATIVE for significant power changes, NEGATIVE for clinical alarms and STABLE for PI events/speed drops. No programming changes were made and pump is functioning within specified parameters. Pt is performing daily controller and system monitor self tests along with completing weekly and monthly maintenance for LVAD equipment.  LVAD equipment check completed and is in good working order. Back-up equipment not present.   Exit Site Care: Drive line is being maintained weekly  by his wife, [name]. Drive line exit site well healed and incorporated. The velour is fully implanted at exit site. Dressing dry and intact. Slight erythema, scant amount of brown drainage.  Stabilization device present and accurately applied. Pt denies fever or chills. 24 Sorbaview dressings, 5 daily dressing kits, and  15 anchors were given to the pt today.   Significant Events on VAD Support:  02/21/17> lap chole  Device:none   BP & Labs:  MAP 94 - Doppler is reflecting Modified  Hgb 10.5 - No S/S of bleeding. Specifically denies melena/BRBPR or nosebleeds.  LDH 375; established baseline of 250- 320. Denies tea-colored urine. No power elevations noted on interrogation. We will continue to check this with his INR.    Patient Instructions:  1. No change in meds 2. Continue same Coumadin dose.  3. Return to [facility] in 3 months for 4 year intermacs and annual maintenance; and in 2 weeks for INR check.  [name] RN [place] Coordinator   Office: [phone] 24/7 Emergency VAD Pager: [phone]

## 2018-08-15 ENCOUNTER — Other Ambulatory Visit (HOSPITAL_COMMUNITY): Payer: Self-pay | Admitting: *Deleted

## 2018-08-15 DIAGNOSIS — Z5181 Encounter for therapeutic drug level monitoring: Secondary | ICD-10-CM

## 2018-08-15 DIAGNOSIS — Z95811 Presence of heart assist device: Secondary | ICD-10-CM

## 2018-08-15 DIAGNOSIS — Z7901 Long term (current) use of anticoagulants: Secondary | ICD-10-CM

## 2018-08-18 ENCOUNTER — Ambulatory Visit (HOSPITAL_COMMUNITY)
Admission: RE | Admit: 2018-08-18 | Discharge: 2018-08-18 | Disposition: A | Discharge status: Home / Self Care | Payer: Medicare Other | Source: Ambulatory Visit | Attending: Internal Medicine | Admitting: Internal Medicine

## 2018-08-18 ENCOUNTER — Ambulatory Visit (HOSPITAL_COMMUNITY): Payer: Self-pay | Admitting: Pharmacist

## 2018-08-18 DIAGNOSIS — Z7901 Long term (current) use of anticoagulants: Secondary | ICD-10-CM | POA: Insufficient documentation

## 2018-08-18 DIAGNOSIS — Z95811 Presence of heart assist device: Secondary | ICD-10-CM | POA: Diagnosis not present

## 2018-08-18 DIAGNOSIS — Z5181 Encounter for therapeutic drug level monitoring: Secondary | ICD-10-CM | POA: Insufficient documentation

## 2018-08-18 LAB — PROTIME-INR
INR: 3.01
PROTHROMBIN TIME: 30.8 s — AB (ref 11.4–15.2)

## 2018-08-18 LAB — LACTATE DEHYDROGENASE: LDH: 303 U/L — ABNORMAL HIGH (ref 98–192)

## 2018-08-29 ENCOUNTER — Other Ambulatory Visit (HOSPITAL_COMMUNITY): Payer: Self-pay | Admitting: *Deleted

## 2018-08-29 DIAGNOSIS — Z95811 Presence of heart assist device: Secondary | ICD-10-CM

## 2018-08-29 DIAGNOSIS — Z7901 Long term (current) use of anticoagulants: Secondary | ICD-10-CM

## 2018-09-01 ENCOUNTER — Ambulatory Visit (HOSPITAL_COMMUNITY)
Admission: RE | Admit: 2018-09-01 | Discharge: 2018-09-01 | Disposition: A | Discharge status: Home / Self Care | Payer: Medicare Other | Source: Ambulatory Visit | Attending: Internal Medicine | Admitting: Internal Medicine

## 2018-09-01 ENCOUNTER — Ambulatory Visit (HOSPITAL_COMMUNITY): Payer: Self-pay | Admitting: Pharmacist

## 2018-09-01 DIAGNOSIS — Z95811 Presence of heart assist device: Secondary | ICD-10-CM | POA: Diagnosis not present

## 2018-09-01 DIAGNOSIS — Z5181 Encounter for therapeutic drug level monitoring: Secondary | ICD-10-CM

## 2018-09-01 DIAGNOSIS — Z7901 Long term (current) use of anticoagulants: Secondary | ICD-10-CM | POA: Insufficient documentation

## 2018-09-01 LAB — PROTIME-INR
INR: 2.5 — ABNORMAL HIGH (ref 0.8–1.2)
Prothrombin Time: 26.6 seconds — ABNORMAL HIGH (ref 11.4–15.2)

## 2018-09-01 LAB — LACTATE DEHYDROGENASE: LDH: 337 U/L — ABNORMAL HIGH (ref 98–192)

## 2018-09-12 ENCOUNTER — Other Ambulatory Visit (HOSPITAL_COMMUNITY): Payer: Self-pay | Admitting: Unknown Physician Specialty

## 2018-09-12 DIAGNOSIS — Z7901 Long term (current) use of anticoagulants: Secondary | ICD-10-CM

## 2018-09-12 DIAGNOSIS — Z95811 Presence of heart assist device: Secondary | ICD-10-CM

## 2018-09-15 ENCOUNTER — Other Ambulatory Visit: Payer: Self-pay

## 2018-09-15 ENCOUNTER — Ambulatory Visit (HOSPITAL_COMMUNITY)
Admission: RE | Admit: 2018-09-15 | Discharge: 2018-09-15 | Disposition: A | Discharge status: Home / Self Care | Payer: Medicare Other | Source: Ambulatory Visit | Attending: Cardiology | Admitting: Cardiology

## 2018-09-15 ENCOUNTER — Ambulatory Visit (HOSPITAL_COMMUNITY): Payer: Self-pay | Admitting: Pharmacist

## 2018-09-15 DIAGNOSIS — Z7901 Long term (current) use of anticoagulants: Secondary | ICD-10-CM | POA: Diagnosis not present

## 2018-09-15 DIAGNOSIS — Z5181 Encounter for therapeutic drug level monitoring: Secondary | ICD-10-CM

## 2018-09-15 DIAGNOSIS — Z95811 Presence of heart assist device: Secondary | ICD-10-CM | POA: Diagnosis not present

## 2018-09-15 LAB — PROTIME-INR
INR: 3.3 — ABNORMAL HIGH (ref 0.8–1.2)
Prothrombin Time: 33 seconds — ABNORMAL HIGH (ref 11.4–15.2)

## 2018-09-15 LAB — LACTATE DEHYDROGENASE: LDH: 262 U/L — ABNORMAL HIGH (ref 98–192)

## 2018-09-17 ENCOUNTER — Telehealth (HOSPITAL_COMMUNITY): Payer: Self-pay | Admitting: Licensed Clinical Social Worker

## 2018-09-17 NOTE — Telephone Encounter (Signed)
CSW contacted patient to assure food, medications and confirmation of VAD pager number if concerns or emergency arise. Patient instructed to stay home and use of proper hygiene and call if needed.  Patient informed that VAD team will call the day before any upcoming appointments with instructions due to current CoVid 19 outbreak. Patient verbalizes understanding and denies any current concerns. Jackie Meeghan Skipper, LCSW, CCSW-MCS [phone]  

## 2018-09-25 ENCOUNTER — Telehealth (HOSPITAL_COMMUNITY): Payer: Self-pay | Admitting: Licensed Clinical Social Worker

## 2018-09-25 NOTE — Telephone Encounter (Signed)
CSW contacted caregiver to follow up on status with coronavirus and if anything needed. Patient/Caregiver instructed to stay home and use of proper hygiene and call if needed. Patient denies any concerns at this time and verbalizes understanding of process for contacting VAD Coordinators if needed. CSW continues to follow as needed. Raquel Sarna, [place], [place]

## 2018-09-26 ENCOUNTER — Other Ambulatory Visit (HOSPITAL_COMMUNITY): Payer: Self-pay | Admitting: Unknown Physician Specialty

## 2018-09-26 DIAGNOSIS — Z95811 Presence of heart assist device: Secondary | ICD-10-CM

## 2018-09-26 DIAGNOSIS — Z7901 Long term (current) use of anticoagulants: Secondary | ICD-10-CM

## 2018-09-29 ENCOUNTER — Ambulatory Visit (HOSPITAL_COMMUNITY): Payer: Self-pay | Admitting: Pharmacist

## 2018-09-29 ENCOUNTER — Other Ambulatory Visit: Payer: Self-pay

## 2018-09-29 ENCOUNTER — Ambulatory Visit (HOSPITAL_COMMUNITY)
Admission: RE | Admit: 2018-09-29 | Discharge: 2018-09-29 | Disposition: A | Discharge status: Home / Self Care | Payer: Medicare Other | Source: Ambulatory Visit | Attending: Cardiology | Admitting: Cardiology

## 2018-09-29 DIAGNOSIS — Z95811 Presence of heart assist device: Secondary | ICD-10-CM | POA: Insufficient documentation

## 2018-09-29 DIAGNOSIS — Z7901 Long term (current) use of anticoagulants: Secondary | ICD-10-CM | POA: Diagnosis not present

## 2018-09-29 DIAGNOSIS — Z5181 Encounter for therapeutic drug level monitoring: Secondary | ICD-10-CM

## 2018-09-29 LAB — PROTIME-INR
INR: 2.2 — ABNORMAL HIGH (ref 0.8–1.2)
Prothrombin Time: 24.4 seconds — ABNORMAL HIGH (ref 11.4–15.2)

## 2018-09-29 LAB — LACTATE DEHYDROGENASE: LDH: 302 U/L — ABNORMAL HIGH (ref 98–192)

## 2018-10-10 ENCOUNTER — Other Ambulatory Visit (HOSPITAL_COMMUNITY): Payer: Self-pay | Admitting: *Deleted

## 2018-10-10 DIAGNOSIS — Z7901 Long term (current) use of anticoagulants: Secondary | ICD-10-CM

## 2018-10-10 DIAGNOSIS — Z95811 Presence of heart assist device: Secondary | ICD-10-CM

## 2018-10-13 ENCOUNTER — Ambulatory Visit (HOSPITAL_COMMUNITY): Payer: Self-pay | Admitting: Pharmacist

## 2018-10-13 ENCOUNTER — Other Ambulatory Visit: Payer: Self-pay

## 2018-10-13 ENCOUNTER — Ambulatory Visit (HOSPITAL_COMMUNITY)
Admission: RE | Admit: 2018-10-13 | Discharge: 2018-10-13 | Disposition: A | Discharge status: Home / Self Care | Payer: Medicare Other | Source: Ambulatory Visit | Attending: Cardiology | Admitting: Cardiology

## 2018-10-13 DIAGNOSIS — Z7901 Long term (current) use of anticoagulants: Secondary | ICD-10-CM | POA: Insufficient documentation

## 2018-10-13 DIAGNOSIS — Z95811 Presence of heart assist device: Secondary | ICD-10-CM | POA: Diagnosis not present

## 2018-10-13 DIAGNOSIS — Z5181 Encounter for therapeutic drug level monitoring: Secondary | ICD-10-CM

## 2018-10-13 LAB — PROTIME-INR
INR: 2.8 — ABNORMAL HIGH (ref 0.8–1.2)
Prothrombin Time: 28.8 seconds — ABNORMAL HIGH (ref 11.4–15.2)

## 2018-10-13 LAB — LACTATE DEHYDROGENASE: LDH: 275 U/L — ABNORMAL HIGH (ref 98–192)

## 2018-10-23 ENCOUNTER — Other Ambulatory Visit (HOSPITAL_COMMUNITY): Payer: Self-pay | Admitting: *Deleted

## 2018-10-23 DIAGNOSIS — Z95811 Presence of heart assist device: Secondary | ICD-10-CM

## 2018-10-23 DIAGNOSIS — Z7901 Long term (current) use of anticoagulants: Secondary | ICD-10-CM

## 2018-10-27 ENCOUNTER — Ambulatory Visit (HOSPITAL_COMMUNITY)
Admission: RE | Admit: 2018-10-27 | Discharge: 2018-10-27 | Disposition: A | Discharge status: Home / Self Care | Payer: Medicare Other | Source: Ambulatory Visit | Attending: Cardiology | Admitting: Cardiology

## 2018-10-27 ENCOUNTER — Ambulatory Visit (HOSPITAL_COMMUNITY): Payer: Self-pay | Admitting: Pharmacist

## 2018-10-27 ENCOUNTER — Telehealth (HOSPITAL_COMMUNITY): Payer: Self-pay | Admitting: Licensed Clinical Social Worker

## 2018-10-27 ENCOUNTER — Other Ambulatory Visit: Payer: Self-pay

## 2018-10-27 DIAGNOSIS — Z7901 Long term (current) use of anticoagulants: Secondary | ICD-10-CM | POA: Diagnosis not present

## 2018-10-27 DIAGNOSIS — Z95811 Presence of heart assist device: Secondary | ICD-10-CM | POA: Diagnosis not present

## 2018-10-27 DIAGNOSIS — Z5181 Encounter for therapeutic drug level monitoring: Secondary | ICD-10-CM

## 2018-10-27 LAB — COMPREHENSIVE METABOLIC PANEL
ALT: 19 U/L (ref 0–44)
AST: 25 U/L (ref 15–41)
Albumin: 4 g/dL (ref 3.5–5.0)
Alkaline Phosphatase: 115 U/L (ref 38–126)
Anion gap: 8 (ref 5–15)
BUN: 15 mg/dL (ref 8–23)
CO2: 24 mmol/L (ref 22–32)
Calcium: 9.4 mg/dL (ref 8.9–10.3)
Chloride: 106 mmol/L (ref 98–111)
Creatinine, Ser: 1.12 mg/dL (ref 0.61–1.24)
GFR calc Af Amer: >60 mL/min (ref >60)
GFR calc non Af Amer: >60 mL/min (ref >60)
Glucose, Bld: 124 mg/dL — ABNORMAL HIGH (ref 70–99)
Potassium: 4.2 mmol/L (ref 3.5–5.1)
Sodium: 138 mmol/L (ref 135–145)
Total Bilirubin: 0.7 mg/dL (ref 0.3–1.2)
Total Protein: 8.2 g/dL — ABNORMAL HIGH (ref 6.5–8.1)

## 2018-10-27 LAB — CBC
HCT: 37.2 % — ABNORMAL LOW (ref 39.0–52.0)
Hemoglobin: 11.1 g/dL — ABNORMAL LOW (ref 13.0–17.0)
MCH: 23.5 pg — ABNORMAL LOW (ref 26.0–34.0)
MCHC: 29.8 g/dL — ABNORMAL LOW (ref 30.0–36.0)
MCV: 78.6 fL — ABNORMAL LOW (ref 80.0–100.0)
Platelets: 418 10*3/uL — ABNORMAL HIGH (ref 150–400)
RBC: 4.73 MIL/uL (ref 4.22–5.81)
RDW: 18.5 % — ABNORMAL HIGH (ref 11.5–15.5)
WBC: 9 10*3/uL (ref 4.0–10.5)
nRBC: 0 % (ref 0.0–0.2)

## 2018-10-27 LAB — LACTATE DEHYDROGENASE: LDH: 306 U/L — ABNORMAL HIGH (ref 98–192)

## 2018-10-27 LAB — PROTIME-INR
INR: 2.8 — ABNORMAL HIGH (ref 0.8–1.2)
Prothrombin Time: 29.4 seconds — ABNORMAL HIGH (ref 11.4–15.2)

## 2018-10-27 LAB — PREALBUMIN: Prealbumin: 18 mg/dL (ref 18–38)

## 2018-10-27 NOTE — Telephone Encounter (Signed)
CSW contacted caregiver to follow up on status with coronavirus and if anything needed. Patient instructed to stay home and use of proper hygiene and call if needed. CSW shared information about LVAD Support Group meeting virtually on May 4th although wife reports unable to access on computer. Patient denies any concerns at this time and verbalizes understanding of process for contacting VAD Coordinators if needed. CSW continues to follow as needed. Raquel Sarna, [place], [place]

## 2018-10-29 ENCOUNTER — Telehealth (HOSPITAL_COMMUNITY): Payer: Self-pay | Admitting: *Deleted

## 2018-10-30 NOTE — Telephone Encounter (Signed)
Pt's wife returned call about upcoming [facility] appointment scheduled 11/03/18. Pt reports he is feeling fine without any issues at this time, based on COVID precautions attempted to re-schedule appt to a virtual visit, pt refused.  Wife asked that the next lab appointment for INR and LDH be changed to 11/10/18 - done.  Next f/u VAD Clinic appointment re-scheduled for 12/15/18 and patient will need to bring home VAD equipment to that visit for annual maintenance. Wife verbalized understanding of same.   Zada Girt RN, [place] Coordinator [phone]

## 2018-11-03 ENCOUNTER — Inpatient Hospital Stay (HOSPITAL_COMMUNITY): Admission: RE | Admit: 2018-11-03 | Payer: Medicare Other | Source: Ambulatory Visit

## 2018-11-06 ENCOUNTER — Other Ambulatory Visit (HOSPITAL_COMMUNITY): Payer: Self-pay | Admitting: *Deleted

## 2018-11-06 DIAGNOSIS — Z7901 Long term (current) use of anticoagulants: Secondary | ICD-10-CM

## 2018-11-06 DIAGNOSIS — I5043 Acute on chronic combined systolic (congestive) and diastolic (congestive) heart failure: Secondary | ICD-10-CM

## 2018-11-06 DIAGNOSIS — Z95811 Presence of heart assist device: Secondary | ICD-10-CM

## 2018-11-10 ENCOUNTER — Ambulatory Visit (HOSPITAL_COMMUNITY)
Admission: RE | Admit: 2018-11-10 | Discharge: 2018-11-10 | Disposition: A | Discharge status: Home / Self Care | Payer: Medicare Other | Source: Ambulatory Visit | Attending: Cardiology | Admitting: Cardiology

## 2018-11-10 ENCOUNTER — Other Ambulatory Visit: Payer: Self-pay

## 2018-11-10 ENCOUNTER — Ambulatory Visit (HOSPITAL_COMMUNITY): Payer: Self-pay | Admitting: Pharmacist

## 2018-11-10 DIAGNOSIS — Z7901 Long term (current) use of anticoagulants: Secondary | ICD-10-CM | POA: Insufficient documentation

## 2018-11-10 DIAGNOSIS — I5043 Acute on chronic combined systolic (congestive) and diastolic (congestive) heart failure: Secondary | ICD-10-CM | POA: Diagnosis not present

## 2018-11-10 DIAGNOSIS — Z95811 Presence of heart assist device: Secondary | ICD-10-CM | POA: Diagnosis not present

## 2018-11-10 DIAGNOSIS — Z5181 Encounter for therapeutic drug level monitoring: Secondary | ICD-10-CM

## 2018-11-10 LAB — CBC
HCT: 36.8 % — ABNORMAL LOW (ref 39.0–52.0)
Hemoglobin: 10.8 g/dL — ABNORMAL LOW (ref 13.0–17.0)
MCH: 23.3 pg — ABNORMAL LOW (ref 26.0–34.0)
MCHC: 29.3 g/dL — ABNORMAL LOW (ref 30.0–36.0)
MCV: 79.3 fL — ABNORMAL LOW (ref 80.0–100.0)
Platelets: 397 10*3/uL (ref 150–400)
RBC: 4.64 MIL/uL (ref 4.22–5.81)
RDW: 18.8 % — ABNORMAL HIGH (ref 11.5–15.5)
WBC: 10 10*3/uL (ref 4.0–10.5)
nRBC: 0 % (ref 0.0–0.2)

## 2018-11-10 LAB — COMPREHENSIVE METABOLIC PANEL
ALT: 18 U/L (ref 0–44)
AST: 26 U/L (ref 15–41)
Albumin: 4 g/dL (ref 3.5–5.0)
Alkaline Phosphatase: 112 U/L (ref 38–126)
Anion gap: 9 (ref 5–15)
BUN: 16 mg/dL (ref 8–23)
CO2: 24 mmol/L (ref 22–32)
Calcium: 9.2 mg/dL (ref 8.9–10.3)
Chloride: 105 mmol/L (ref 98–111)
Creatinine, Ser: 1.14 mg/dL (ref 0.61–1.24)
GFR calc Af Amer: >60 mL/min (ref >60)
GFR calc non Af Amer: >60 mL/min (ref >60)
Glucose, Bld: 128 mg/dL — ABNORMAL HIGH (ref 70–99)
Potassium: 4 mmol/L (ref 3.5–5.1)
Sodium: 138 mmol/L (ref 135–145)
Total Bilirubin: 0.7 mg/dL (ref 0.3–1.2)
Total Protein: 8.2 g/dL — ABNORMAL HIGH (ref 6.5–8.1)

## 2018-11-10 LAB — LACTATE DEHYDROGENASE: LDH: 330 U/L — ABNORMAL HIGH (ref 98–192)

## 2018-11-10 LAB — PROTIME-INR
INR: 2.7 — ABNORMAL HIGH (ref 0.8–1.2)
Prothrombin Time: 28.4 seconds — ABNORMAL HIGH (ref 11.4–15.2)

## 2018-11-10 LAB — PREALBUMIN: Prealbumin: 16.7 mg/dL — ABNORMAL LOW (ref 18–38)

## 2018-11-10 NOTE — Progress Notes (Signed)
Pt here for lab only visit.   Pt completed 4 yr Intermacs questionnaires including: EQ-5D-3L and KCCQ-12.  Pt provided with dressing supplies including 12 anchors, 15 Sorbaview dressings, tape, adhesive remover.   Zada Girt RN, [place] Coordinator [phone]

## 2018-11-10 NOTE — Addendum Note (Signed)
Encounter addended by: Lezlie Octave, RN on: 11/10/2018 11:49 AM  Actions taken: Clinical Note Signed

## 2018-11-21 ENCOUNTER — Other Ambulatory Visit (HOSPITAL_COMMUNITY): Payer: Self-pay | Admitting: *Deleted

## 2018-11-21 DIAGNOSIS — Z95811 Presence of heart assist device: Secondary | ICD-10-CM

## 2018-11-21 DIAGNOSIS — Z7901 Long term (current) use of anticoagulants: Secondary | ICD-10-CM

## 2018-11-25 ENCOUNTER — Ambulatory Visit (HOSPITAL_COMMUNITY): Payer: Self-pay | Admitting: Pharmacist

## 2018-11-25 ENCOUNTER — Other Ambulatory Visit: Payer: Self-pay

## 2018-11-25 ENCOUNTER — Ambulatory Visit (HOSPITAL_COMMUNITY)
Admission: RE | Admit: 2018-11-25 | Discharge: 2018-11-25 | Disposition: A | Discharge status: Home / Self Care | Payer: Medicare Other | Source: Ambulatory Visit | Attending: Cardiology | Admitting: Cardiology

## 2018-11-25 DIAGNOSIS — Z95811 Presence of heart assist device: Secondary | ICD-10-CM

## 2018-11-25 DIAGNOSIS — Z5181 Encounter for therapeutic drug level monitoring: Secondary | ICD-10-CM

## 2018-11-25 DIAGNOSIS — Z7901 Long term (current) use of anticoagulants: Secondary | ICD-10-CM | POA: Diagnosis not present

## 2018-11-25 LAB — PROTIME-INR
INR: 2.5 — ABNORMAL HIGH (ref 0.8–1.2)
Prothrombin Time: 26.7 seconds — ABNORMAL HIGH (ref 11.4–15.2)

## 2018-11-25 LAB — LACTATE DEHYDROGENASE: LDH: 383 U/L — ABNORMAL HIGH (ref 98–192)

## 2018-11-28 ENCOUNTER — Other Ambulatory Visit (HOSPITAL_COMMUNITY): Payer: Self-pay | Admitting: *Deleted

## 2018-11-28 DIAGNOSIS — Z7901 Long term (current) use of anticoagulants: Secondary | ICD-10-CM

## 2018-11-28 DIAGNOSIS — Z95811 Presence of heart assist device: Secondary | ICD-10-CM

## 2018-12-04 ENCOUNTER — Other Ambulatory Visit: Payer: Self-pay

## 2018-12-04 ENCOUNTER — Ambulatory Visit (HOSPITAL_COMMUNITY)
Admission: RE | Admit: 2018-12-04 | Discharge: 2018-12-04 | Disposition: A | Discharge status: Home / Self Care | Payer: Medicare Other | Source: Ambulatory Visit | Attending: Internal Medicine | Admitting: Internal Medicine

## 2018-12-04 ENCOUNTER — Ambulatory Visit (HOSPITAL_COMMUNITY): Payer: Self-pay | Admitting: Pharmacist

## 2018-12-04 DIAGNOSIS — Z95811 Presence of heart assist device: Secondary | ICD-10-CM | POA: Diagnosis not present

## 2018-12-04 DIAGNOSIS — Z7901 Long term (current) use of anticoagulants: Secondary | ICD-10-CM | POA: Insufficient documentation

## 2018-12-04 DIAGNOSIS — Z5181 Encounter for therapeutic drug level monitoring: Secondary | ICD-10-CM

## 2018-12-04 LAB — PROTIME-INR
INR: 2.5 — ABNORMAL HIGH (ref 0.8–1.2)
Prothrombin Time: 26.5 seconds — ABNORMAL HIGH (ref 11.4–15.2)

## 2018-12-04 LAB — LACTATE DEHYDROGENASE: LDH: 368 U/L — ABNORMAL HIGH (ref 98–192)

## 2018-12-08 ENCOUNTER — Encounter (HOSPITAL_COMMUNITY): Payer: Medicare Other

## 2018-12-12 ENCOUNTER — Other Ambulatory Visit (HOSPITAL_COMMUNITY): Payer: Self-pay | Admitting: Unknown Physician Specialty

## 2018-12-12 DIAGNOSIS — Z95811 Presence of heart assist device: Secondary | ICD-10-CM

## 2018-12-12 DIAGNOSIS — Z7901 Long term (current) use of anticoagulants: Secondary | ICD-10-CM

## 2018-12-15 ENCOUNTER — Other Ambulatory Visit: Payer: Self-pay

## 2018-12-15 ENCOUNTER — Encounter (HOSPITAL_COMMUNITY): Payer: Self-pay

## 2018-12-15 ENCOUNTER — Ambulatory Visit (HOSPITAL_COMMUNITY)
Admission: RE | Admit: 2018-12-15 | Discharge: 2018-12-15 | Disposition: A | Discharge status: Home / Self Care | Payer: Medicare Other | Source: Ambulatory Visit | Attending: Internal Medicine | Admitting: Internal Medicine

## 2018-12-15 ENCOUNTER — Ambulatory Visit (HOSPITAL_COMMUNITY): Payer: Self-pay | Admitting: Pharmacist

## 2018-12-15 DIAGNOSIS — E119 Type 2 diabetes mellitus without complications: Secondary | ICD-10-CM | POA: Diagnosis not present

## 2018-12-15 DIAGNOSIS — Z5181 Encounter for therapeutic drug level monitoring: Secondary | ICD-10-CM

## 2018-12-15 DIAGNOSIS — I252 Old myocardial infarction: Secondary | ICD-10-CM | POA: Insufficient documentation

## 2018-12-15 DIAGNOSIS — N200 Calculus of kidney: Secondary | ICD-10-CM | POA: Insufficient documentation

## 2018-12-15 DIAGNOSIS — Z8249 Family history of ischemic heart disease and other diseases of the circulatory system: Secondary | ICD-10-CM | POA: Insufficient documentation

## 2018-12-15 DIAGNOSIS — Z7902 Long term (current) use of antithrombotics/antiplatelets: Secondary | ICD-10-CM | POA: Diagnosis not present

## 2018-12-15 DIAGNOSIS — Z87891 Personal history of nicotine dependence: Secondary | ICD-10-CM | POA: Diagnosis not present

## 2018-12-15 DIAGNOSIS — Z7982 Long term (current) use of aspirin: Secondary | ICD-10-CM | POA: Insufficient documentation

## 2018-12-15 DIAGNOSIS — Z79899 Other long term (current) drug therapy: Secondary | ICD-10-CM | POA: Insufficient documentation

## 2018-12-15 DIAGNOSIS — E785 Hyperlipidemia, unspecified: Secondary | ICD-10-CM | POA: Insufficient documentation

## 2018-12-15 DIAGNOSIS — I5022 Chronic systolic (congestive) heart failure: Secondary | ICD-10-CM | POA: Diagnosis present

## 2018-12-15 DIAGNOSIS — Z95811 Presence of heart assist device: Secondary | ICD-10-CM

## 2018-12-15 DIAGNOSIS — I255 Ischemic cardiomyopathy: Secondary | ICD-10-CM | POA: Diagnosis not present

## 2018-12-15 DIAGNOSIS — I10 Essential (primary) hypertension: Secondary | ICD-10-CM

## 2018-12-15 DIAGNOSIS — M459 Ankylosing spondylitis of unspecified sites in spine: Secondary | ICD-10-CM | POA: Insufficient documentation

## 2018-12-15 DIAGNOSIS — Z7901 Long term (current) use of anticoagulants: Secondary | ICD-10-CM | POA: Insufficient documentation

## 2018-12-15 DIAGNOSIS — Z955 Presence of coronary angioplasty implant and graft: Secondary | ICD-10-CM | POA: Diagnosis not present

## 2018-12-15 DIAGNOSIS — Z86711 Personal history of pulmonary embolism: Secondary | ICD-10-CM | POA: Diagnosis not present

## 2018-12-15 DIAGNOSIS — Z86718 Personal history of other venous thrombosis and embolism: Secondary | ICD-10-CM | POA: Diagnosis not present

## 2018-12-15 DIAGNOSIS — Z9049 Acquired absence of other specified parts of digestive tract: Secondary | ICD-10-CM | POA: Insufficient documentation

## 2018-12-15 DIAGNOSIS — I110 Hypertensive heart disease with heart failure: Secondary | ICD-10-CM | POA: Insufficient documentation

## 2018-12-15 DIAGNOSIS — I2510 Atherosclerotic heart disease of native coronary artery without angina pectoris: Secondary | ICD-10-CM | POA: Insufficient documentation

## 2018-12-15 LAB — CBC
HCT: 39.4 % (ref 39.0–52.0)
Hemoglobin: 11.6 g/dL — ABNORMAL LOW (ref 13.0–17.0)
MCH: 23.8 pg — ABNORMAL LOW (ref 26.0–34.0)
MCHC: 29.4 g/dL — ABNORMAL LOW (ref 30.0–36.0)
MCV: 80.9 fL (ref 80.0–100.0)
Platelets: 422 10*3/uL — ABNORMAL HIGH (ref 150–400)
RBC: 4.87 MIL/uL (ref 4.22–5.81)
RDW: 19.5 % — ABNORMAL HIGH (ref 11.5–15.5)
WBC: 8.6 10*3/uL (ref 4.0–10.5)
nRBC: 0 % (ref 0.0–0.2)

## 2018-12-15 LAB — BASIC METABOLIC PANEL
Anion gap: 10 (ref 5–15)
BUN: 15 mg/dL (ref 8–23)
CO2: 24 mmol/L (ref 22–32)
Calcium: 9.2 mg/dL (ref 8.9–10.3)
Chloride: 102 mmol/L (ref 98–111)
Creatinine, Ser: 1.11 mg/dL (ref 0.61–1.24)
GFR calc Af Amer: >60 mL/min (ref >60)
GFR calc non Af Amer: >60 mL/min (ref >60)
Glucose, Bld: 86 mg/dL (ref 70–99)
Potassium: 4 mmol/L (ref 3.5–5.1)
Sodium: 136 mmol/L (ref 135–145)

## 2018-12-15 LAB — PROTIME-INR
INR: 2.8 — ABNORMAL HIGH (ref 0.8–1.2)
Prothrombin Time: 29.4 seconds — ABNORMAL HIGH (ref 11.4–15.2)

## 2018-12-15 LAB — LACTATE DEHYDROGENASE: LDH: 381 U/L — ABNORMAL HIGH (ref 98–192)

## 2018-12-15 NOTE — Progress Notes (Addendum)
Patient presents for 3 month  follow up in [facility] today with wife. Reports no problems with VAD equipment or concerns with drive line.  Pt states that he has been feeling good since his last visit. Denies shortness of breath, edema, dark urine, and bleeding.    Vital Signs:  Doppler Pressure: 100 Automatc BP:  106/76 (87) HR: 100 SPO2: 97%  Weight: 195.0 lb w/o eqt Last weight: 196.2 lbs Home weights: 185 - 188 lbs   VAD Indication: Destination Therapy patient choice    VAD interrogation & Equipment Management: Speed:9200 Flow: 4.5 Power: 5.2w    PI: 6.7  Alarms: few low voltage Events: 5-10 PI events per day  Fixed speed 9200 Low speed limit: 8600  Primary back-up battery expiring in 22 months Back up Controller: Back up battery changed today in clinic. Expiring in 22 mo  Annual Equipment Maintenance on UBC/PM was performed on 12/2018.   I reviewed the LVAD parameters from today and compared the results to the patient's prior recorded data. LVAD interrogation was NEGATIVE for significant power changes, NEGATIVE for clinical alarms and STABLE for PI events/speed drops. No programming changes were made and pump is functioning within specified parameters. Pt is performing daily controller and system monitor self tests along with completing weekly and monthly maintenance for LVAD equipment.  LVAD equipment check completed and is in good working order. Back-up equipment not present.   Exit Site Care: Drive line is being maintained weekly  by his wife, [name]. Drive line exit site well healed and incorporated. The velour is fully implanted at exit site. Dressing dry and intact. Slight erythema, scant amount of brown drainage. Stabilization device present and accurately applied. Pt denies fever or chills. 1 box 4x4 gauze provided. Wife states they have adequate dressing supplies otherwise.    Significant Events on VAD Support:  02/21/17> lap  chole  Device:none   BP & Labs:  MAP 100 - Doppler is reflecting modified systolic  Hgb 75.8 - No S/S of bleeding. Specifically denies melena/BRBPR or nosebleeds.  LDH 381; established baseline of 250- 320. Denies tea-colored urine. No power elevations noted on interrogation. We will continue to check this with his INR.    Batteries Manufacture Date: Number of uses: Re-calibration  04/2017 99-100 Performed by patient   Annual maintenance completed per [employer] on patient's home power module and Electrical engineer. Per patient and wife they do not want/need a new patient cable.   Backup system controller 11 volt battery charged during visit.    Patient Instructions:  1. No medication changes today. 2. Lattie Haw will be in touch regarding Coumadin dosing. Return for INR in 2 weeks. 3. Return to [facility] in 3 months.    Emerson Monte RN [place] Coordinator  Office: [phone]  24/7 Pager: [phone]

## 2018-12-15 NOTE — Progress Notes (Signed)
Patient ID: Scott [name], male   DOB: 09-05-1945, 73 y.o.   MRN: [id number]     Primary Cardiologist: Dr Haroldine Laws INR : [facility]  HPI: Scott [name] is a 73 y/o male with h/o CAD with ankylosing spondylitis, DM2, carotid stenting, severe ischemic CM s/p anterior STEM with VT arrest in 12/15, large PE with pulmonary infarct in 2/15 s/p IVC filter and systolic HF with EF 95%.  He underwent HM II LVAD placement on 09/20/13. He had a prolonged hospital course due to recurrent chest bleeding, severe CO2 retention and RV failure.  In 8/18 admitted with acute gallstone pancreatitis. Underwent lap chole 02/21/17.   Admitted 2/18 with elevated LDH. He was started on bivalirudin. LDH went down [phone].   Today he returns for 3 month VAD follow up. Feels very good. Remains active no significant DOE. Denies orthopnea or PND. No fevers, chills or problems with driveline. No bleeding, melena or neuro symptoms. No VAD alarms. Taking all meds as prescribed. Follwos weight and BP very closely and they are stable   VAD Indication: Destination Therapy patient choice    VAD interrogation & Equipment Management: Speed:9200 Flow: 4.5 Power: 5.2w PI: 6.7  Alarms: few low voltage Events: 5-10 PI events per day  Fixed speed 9200 Low speed limit: 8600  Primary back-up battery expiring in 22 months Back up Controller: Back up battery changed today in clinic. Expiring in 22 mo  Annual Equipment Maintenance on UBC/PM was performed on 12/2018. Review of systems complete and found to be negative unless listed in HPI.    SH:  Social History   Socioeconomic History  . Marital status: Married    Spouse name: Not on file  . Number of children: Not on file  . Years of education: Not on file  . Highest education level: Not on file  Occupational History  . Occupation: retired  Scientific laboratory technician  . Financial resource strain: Not on file  . Food insecurity    Worry: Not on file   Inability: Not on file  . Transportation needs    Medical: Not on file    Non-medical: Not on file  Tobacco Use  . Smoking status: Former Smoker    Packs/day: 0.30    Years: 0.00    Pack years: 0.00    Types: Cigarettes    Quit date: 06/12/1969    Years since quitting: 49.5  . Smokeless tobacco: Never Used  . 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  Substance and Sexual Activity  . Alcohol use: No    Alcohol/week: 0.0 standard drinks  . Drug use: No  . Sexual activity: Not Currently  Lifestyle  . Physical activity    Days per week: Not on file    Minutes per session: Not on file  . Stress: Not on file  Relationships  . Social Herbalist on phone: Not on file    Gets together: Not on file    Attends religious service: Not on file    Active member of club or organization: Not on file    Attends meetings of clubs or organizations: Not on file    Relationship status: Not on file  . Intimate partner violence    Fear of current or ex partner: Not on file    Emotionally abused: Not on file    Physically abused: Not on file    Forced sexual activity: Not on file  Other Topics Concern  .  Not on file  Social History Narrative  . Not on file    FH:  Family History  Problem Relation Age of Onset  . Hypertension Father   . Heart attack Father   . Heart Problems Sister     Past Medical History:  Diagnosis Date  . Acute on chronic respiratory failure ([place])    a. 08/2014 in setting of PE.  Marland Kitchen Ankylosing spondylitis ([place])   . Bilateral pulmonary embolism ([place])    a. 08/2014 - started on Coumadin. Retrievable IVC filter placed 08/27/14 due to RV strain and large clot burden.  Marland Kitchen CAD (coronary artery disease)    a. stenting of LCx 2013; b. STEMI 06/12/14 s/p PCI to LAD complicated by post cath shock requiring IABP; VT s/p DCCV, EF 20%; c. NSTEMI 06/26/14 treated medically.  . Carotid artery disease ([place])    a. s/p stenting.  . Chronic systolic  CHF (congestive heart failure) ([place])   . Diabetes ([place])   . Hemoptysis    a. 08/2014 possibly due to pulm infarct/PE.  Marland Kitchen Hyperlipidemia   . Hypertension   . Hypotension   . Ischemic cardiomyopathy    a. echo 08/23/2014 EF <20%, dilated CM, mod MR/TR  . Leukocytosis   . Leukocytosis 09/10/2014  . Nephrolithiasis   . Pleural effusion on right 08/2014 - small  . Reactive thrombocytosis 09/10/2014  . Right leg DVT ([place])    a. 08/2014.  Marland Kitchen Ventricular tachycardia ([place])    a. 06/2014 at time of MI, s/p DCCV.   Current Outpatient Medications  Medication Sig Dispense Refill  . acetaminophen (TYLENOL) 500 MG tablet Take 1,000 mg by mouth every 6 (six) hours as needed for moderate pain.     Marland Kitchen aspirin EC 81 MG tablet Take 1 tablet (81 mg total) by mouth daily. 90 tablet 3  . clopidogrel (PLAVIX) 75 MG tablet Take 1 tablet (75 mg total) by mouth daily. 90 tablet 3  . cyclobenzaprine (FLEXERIL) 5 MG tablet Take 1 tablet (5 mg total) by mouth 3 (three) times daily as needed for muscle spasms. 270 tablet 3  . furosemide (LASIX) 20 MG tablet Every other day alternate 20 mg (1 pill) with 40 mg (2 pills) 135 tablet 3  . pantoprazole (PROTONIX) 40 MG tablet Take 1 tablet (40 mg total) by mouth at bedtime. 90 tablet 3  . potassium chloride SA (K-DUR,KLOR-CON) 20 MEQ tablet Every other day alternate 20 mEq (1 tablet) with 40 mEq (2 tablets) 135 tablet 3  . rosuvastatin (CRESTOR) 10 MG tablet Take 1 tablet (10 mg total) by mouth at bedtime. 90 tablet 3  . traMADol (ULTRAM) 50 MG tablet Take 2 tablets (100 mg total) by mouth every 6 (six) hours as needed for moderate pain. (Patient taking differently: Take 50 mg by mouth every 6 (six) hours as needed for moderate pain. ) 120 tablet 3  . traZODone (DESYREL) 100 MG tablet Take 1 tablet (100 mg total) by mouth at bedtime. 90 tablet 6  . warfarin (COUMADIN) 6 MG tablet Take 1 tablet (6 mg total) by mouth See admin instructions. Take 1 tablet (6 mg) daily except 1 and  1/2 tablets (9 mg) on wednesday (Patient taking differently: Take 6 mg by mouth daily. Warfarin  6mg  daily or as directed by HF clinic) 120 tablet 3  . sildenafil (VIAGRA) 100 MG tablet Take 1 tablet (100 mg total) by mouth daily as needed for erectile dysfunction. (Patient not taking: Reported on 06/14/2018) 10 tablet 3  No current facility-administered medications for this encounter.     Vital Signs:  Doppler Pressure: 100 Automatc BP:  106/76 (87) HR: 100 SPO2: 97%  Weight: 195.0 lb w/o eqt Last weight: 196.2 lbs Home weights: 185 - 188 lbs    Wt Readings from Last 3 Encounters:  12/15/18 88.5 kg (195 lb)  08/04/18 89 kg (196 lb 3.2 oz)  05/12/18 88.6 kg (195 lb 6.4 oz)   Physical Exam: General:  NAD.  HEENT: normal  Neck: supple. JVP not elevated.  Carotids 2+ bilat; no bruits. No lymphadenopathy or thryomegaly appreciated. Cor: LVAD hum.  Lungs: Clear. Abdomen: soft, nontender, non-distended. No hepatosplenomegaly. No bruits or masses. Good bowel sounds. Driveline site clean. Anchor in place.  Extremities: no cyanosis, clubbing, rash. Warm no edema  Neuro: alert & oriented x 3. No focal deficits. Moves all 4 without problem   ASSESSMENT & PLAN:  1. Chronic systolic HF: Ischemic cardiomyopathy, EF 20% s/p HMII LVAD 09/20/13.   - Doing very well over 5 years out. NYHA I-II with VAD support.   - Volume status stable.  Continue current dose of lasix.   - Refuses transplant evaluation.  2. CAD s/p Anterior STEMI on 06/12/14 with stenting of LAD:  - No s/s ischemia - Continue statin. Off ASA due to bleeding.  3. VAD management:  - Had admit in 08/2017 for elevated LDH.  - LDH stabilized 330-380. No other signs/sx of pump thrombosis - VAD interrogated personally. Parameters stable. - Off asa. On coumadin.  - INR stable 2.8 Discussed dosing with PharmD personally..   4. Ankylosing spondylitis: Off NSAIDS. -  Follows with rheumatology. Uses tramadol. Avoids NSAIDs as  much as possible. - Pain has been well controlled. No change to current plan.   5. Gallstone pancreatitis - s/p lap chole 02/21/17. No further.  6. Anticoagulation: - On coumadin.  -INR stable 2.8 -No bleeding.  7. HTN:  -Blood pressure well controlled. Continue current regimen.Marland Kitchen  8. Kidney Stones  - CT with renal calculi bilaterally. No intervention needed at this time.  Now followed by Dr Tresa Moore at [facility]. - Continue f/u  Total time spent 35 minutes. Over half that time spent discussing above.   [name], MD  12/15/2018

## 2018-12-26 ENCOUNTER — Other Ambulatory Visit (HOSPITAL_COMMUNITY): Payer: Self-pay | Admitting: *Deleted

## 2018-12-26 DIAGNOSIS — Z7901 Long term (current) use of anticoagulants: Secondary | ICD-10-CM

## 2018-12-26 DIAGNOSIS — Z95811 Presence of heart assist device: Secondary | ICD-10-CM

## 2018-12-29 ENCOUNTER — Other Ambulatory Visit: Payer: Self-pay

## 2018-12-29 ENCOUNTER — Ambulatory Visit (HOSPITAL_COMMUNITY)
Admission: RE | Admit: 2018-12-29 | Discharge: 2018-12-29 | Disposition: A | Discharge status: Home / Self Care | Payer: Medicare Other | Source: Ambulatory Visit | Attending: Cardiology | Admitting: Cardiology

## 2018-12-29 ENCOUNTER — Other Ambulatory Visit (HOSPITAL_COMMUNITY): Payer: Self-pay | Admitting: Unknown Physician Specialty

## 2018-12-29 ENCOUNTER — Ambulatory Visit (HOSPITAL_COMMUNITY): Payer: Self-pay | Admitting: Pharmacist

## 2018-12-29 DIAGNOSIS — Z95811 Presence of heart assist device: Secondary | ICD-10-CM | POA: Insufficient documentation

## 2018-12-29 DIAGNOSIS — Z5181 Encounter for therapeutic drug level monitoring: Secondary | ICD-10-CM

## 2018-12-29 DIAGNOSIS — Z7901 Long term (current) use of anticoagulants: Secondary | ICD-10-CM

## 2018-12-29 LAB — PROTIME-INR
INR: 2.6 — ABNORMAL HIGH (ref 0.8–1.2)
Prothrombin Time: 27.5 seconds — ABNORMAL HIGH (ref 11.4–15.2)

## 2018-12-29 LAB — LACTATE DEHYDROGENASE: LDH: 518 U/L — ABNORMAL HIGH (ref 98–192)

## 2018-12-31 ENCOUNTER — Telehealth (HOSPITAL_COMMUNITY): Payer: Self-pay | Admitting: Internal Medicine

## 2018-12-31 NOTE — Telephone Encounter (Signed)
COVID Screening completed. ° °-[facility] °

## 2019-01-01 ENCOUNTER — Other Ambulatory Visit: Payer: Self-pay

## 2019-01-01 ENCOUNTER — Ambulatory Visit (HOSPITAL_COMMUNITY): Payer: Self-pay | Admitting: Pharmacist

## 2019-01-01 ENCOUNTER — Ambulatory Visit (HOSPITAL_COMMUNITY)
Admission: RE | Admit: 2019-01-01 | Discharge: 2019-01-01 | Disposition: A | Discharge status: Home / Self Care | Payer: Medicare Other | Source: Ambulatory Visit | Attending: Internal Medicine | Admitting: Internal Medicine

## 2019-01-01 DIAGNOSIS — Z5181 Encounter for therapeutic drug level monitoring: Secondary | ICD-10-CM

## 2019-01-01 DIAGNOSIS — Z7901 Long term (current) use of anticoagulants: Secondary | ICD-10-CM | POA: Insufficient documentation

## 2019-01-01 DIAGNOSIS — Z95811 Presence of heart assist device: Secondary | ICD-10-CM | POA: Diagnosis not present

## 2019-01-01 LAB — PROTIME-INR
INR: 2.7 — ABNORMAL HIGH (ref 0.8–1.2)
Prothrombin Time: 28.3 seconds — ABNORMAL HIGH (ref 11.4–15.2)

## 2019-01-01 LAB — LACTATE DEHYDROGENASE: LDH: 366 U/L — ABNORMAL HIGH (ref 98–192)

## 2019-01-09 ENCOUNTER — Other Ambulatory Visit (HOSPITAL_COMMUNITY): Payer: Self-pay | Admitting: *Deleted

## 2019-01-09 DIAGNOSIS — Z7901 Long term (current) use of anticoagulants: Secondary | ICD-10-CM

## 2019-01-09 DIAGNOSIS — Z95811 Presence of heart assist device: Secondary | ICD-10-CM

## 2019-01-13 DIAGNOSIS — N200 Calculus of kidney: Secondary | ICD-10-CM | POA: Diagnosis not present

## 2019-01-13 DIAGNOSIS — R310 Gross hematuria: Secondary | ICD-10-CM | POA: Diagnosis not present

## 2019-01-13 DIAGNOSIS — N5201 Erectile dysfunction due to arterial insufficiency: Secondary | ICD-10-CM | POA: Diagnosis not present

## 2019-01-15 ENCOUNTER — Other Ambulatory Visit: Payer: Self-pay

## 2019-01-15 ENCOUNTER — Ambulatory Visit (HOSPITAL_COMMUNITY)
Admission: RE | Admit: 2019-01-15 | Discharge: 2019-01-15 | Disposition: A | Discharge status: Home / Self Care | Payer: Medicare Other | Source: Ambulatory Visit | Attending: Internal Medicine | Admitting: Internal Medicine

## 2019-01-15 ENCOUNTER — Ambulatory Visit (HOSPITAL_COMMUNITY): Payer: Self-pay | Admitting: Pharmacist

## 2019-01-15 DIAGNOSIS — Z95811 Presence of heart assist device: Secondary | ICD-10-CM | POA: Insufficient documentation

## 2019-01-15 DIAGNOSIS — Z7901 Long term (current) use of anticoagulants: Secondary | ICD-10-CM | POA: Insufficient documentation

## 2019-01-15 DIAGNOSIS — Z5181 Encounter for therapeutic drug level monitoring: Secondary | ICD-10-CM

## 2019-01-15 LAB — PROTIME-INR
INR: 2.3 — ABNORMAL HIGH (ref 0.8–1.2)
Prothrombin Time: 24.8 seconds — ABNORMAL HIGH (ref 11.4–15.2)

## 2019-01-15 LAB — LACTATE DEHYDROGENASE: LDH: 384 U/L — ABNORMAL HIGH (ref 98–192)

## 2019-01-23 ENCOUNTER — Other Ambulatory Visit (HOSPITAL_COMMUNITY): Payer: Self-pay | Admitting: *Deleted

## 2019-01-23 DIAGNOSIS — Z7901 Long term (current) use of anticoagulants: Secondary | ICD-10-CM

## 2019-01-23 DIAGNOSIS — Z95811 Presence of heart assist device: Secondary | ICD-10-CM

## 2019-01-29 ENCOUNTER — Ambulatory Visit (HOSPITAL_COMMUNITY): Payer: Self-pay | Admitting: Pharmacist

## 2019-01-29 ENCOUNTER — Other Ambulatory Visit: Payer: Self-pay

## 2019-01-29 ENCOUNTER — Ambulatory Visit (HOSPITAL_COMMUNITY)
Admission: RE | Admit: 2019-01-29 | Discharge: 2019-01-29 | Disposition: A | Discharge status: Home / Self Care | Payer: Medicare Other | Source: Ambulatory Visit | Attending: Cardiology | Admitting: Cardiology

## 2019-01-29 DIAGNOSIS — Z7901 Long term (current) use of anticoagulants: Secondary | ICD-10-CM | POA: Insufficient documentation

## 2019-01-29 DIAGNOSIS — Z95811 Presence of heart assist device: Secondary | ICD-10-CM | POA: Insufficient documentation

## 2019-01-29 DIAGNOSIS — Z5181 Encounter for therapeutic drug level monitoring: Secondary | ICD-10-CM

## 2019-01-29 LAB — PROTIME-INR
INR: 2.5 — ABNORMAL HIGH (ref 0.8–1.2)
Prothrombin Time: 26.5 seconds — ABNORMAL HIGH (ref 11.4–15.2)

## 2019-01-29 LAB — LACTATE DEHYDROGENASE: LDH: 320 U/L — ABNORMAL HIGH (ref 98–192)

## 2019-02-06 ENCOUNTER — Other Ambulatory Visit (HOSPITAL_COMMUNITY): Payer: Self-pay | Admitting: *Deleted

## 2019-02-06 DIAGNOSIS — Z7901 Long term (current) use of anticoagulants: Secondary | ICD-10-CM

## 2019-02-06 DIAGNOSIS — Z95811 Presence of heart assist device: Secondary | ICD-10-CM

## 2019-02-12 ENCOUNTER — Ambulatory Visit (HOSPITAL_COMMUNITY)
Admission: RE | Admit: 2019-02-12 | Discharge: 2019-02-12 | Disposition: A | Discharge status: Home / Self Care | Payer: Medicare Other | Source: Ambulatory Visit | Attending: Internal Medicine | Admitting: Internal Medicine

## 2019-02-12 ENCOUNTER — Other Ambulatory Visit: Payer: Self-pay

## 2019-02-12 ENCOUNTER — Ambulatory Visit (HOSPITAL_COMMUNITY): Payer: Self-pay | Admitting: Pharmacist

## 2019-02-12 DIAGNOSIS — Z5181 Encounter for therapeutic drug level monitoring: Secondary | ICD-10-CM

## 2019-02-12 DIAGNOSIS — Z7901 Long term (current) use of anticoagulants: Secondary | ICD-10-CM | POA: Insufficient documentation

## 2019-02-12 DIAGNOSIS — Z95811 Presence of heart assist device: Secondary | ICD-10-CM | POA: Diagnosis not present

## 2019-02-12 LAB — LACTATE DEHYDROGENASE: LDH: 332 U/L — ABNORMAL HIGH (ref 98–192)

## 2019-02-12 LAB — PROTIME-INR
INR: 2.6 — ABNORMAL HIGH (ref 0.8–1.2)
Prothrombin Time: 27.5 seconds — ABNORMAL HIGH (ref 11.4–15.2)

## 2019-02-19 ENCOUNTER — Other Ambulatory Visit (HOSPITAL_COMMUNITY): Payer: Self-pay | Admitting: *Deleted

## 2019-02-19 DIAGNOSIS — Z7901 Long term (current) use of anticoagulants: Secondary | ICD-10-CM

## 2019-02-19 DIAGNOSIS — Z95811 Presence of heart assist device: Secondary | ICD-10-CM

## 2019-02-26 ENCOUNTER — Other Ambulatory Visit: Payer: Self-pay

## 2019-02-26 ENCOUNTER — Ambulatory Visit (HOSPITAL_COMMUNITY): Payer: Self-pay | Admitting: Pharmacist

## 2019-02-26 ENCOUNTER — Ambulatory Visit (HOSPITAL_COMMUNITY)
Admission: RE | Admit: 2019-02-26 | Discharge: 2019-02-26 | Disposition: A | Discharge status: Home / Self Care | Payer: Medicare Other | Source: Ambulatory Visit | Attending: Internal Medicine | Admitting: Internal Medicine

## 2019-02-26 DIAGNOSIS — Z95811 Presence of heart assist device: Secondary | ICD-10-CM

## 2019-02-26 DIAGNOSIS — Z5181 Encounter for therapeutic drug level monitoring: Secondary | ICD-10-CM

## 2019-02-26 DIAGNOSIS — Z7901 Long term (current) use of anticoagulants: Secondary | ICD-10-CM | POA: Insufficient documentation

## 2019-02-26 LAB — PROTIME-INR
INR: 2.4 — AB (ref <=1.1)
INR: 2.4 — ABNORMAL HIGH (ref 0.8–1.2)
Prothrombin Time: 25.9 seconds — ABNORMAL HIGH (ref 11.4–15.2)

## 2019-02-26 LAB — LACTATE DEHYDROGENASE: LDH: 327 U/L — ABNORMAL HIGH (ref 98–192)

## 2019-02-26 NOTE — Progress Notes (Signed)
LVAD INR 

## 2019-03-13 ENCOUNTER — Other Ambulatory Visit (HOSPITAL_COMMUNITY): Payer: Self-pay | Admitting: *Deleted

## 2019-03-13 DIAGNOSIS — Z95811 Presence of heart assist device: Secondary | ICD-10-CM

## 2019-03-13 DIAGNOSIS — Z7901 Long term (current) use of anticoagulants: Secondary | ICD-10-CM

## 2019-03-16 ENCOUNTER — Ambulatory Visit (HOSPITAL_COMMUNITY)
Admission: RE | Admit: 2019-03-16 | Discharge: 2019-03-16 | Disposition: A | Discharge status: Home / Self Care | Payer: Medicare Other | Source: Ambulatory Visit | Attending: Internal Medicine | Admitting: Internal Medicine

## 2019-03-16 ENCOUNTER — Ambulatory Visit (HOSPITAL_COMMUNITY): Payer: Self-pay | Admitting: Pharmacist

## 2019-03-16 ENCOUNTER — Encounter (HOSPITAL_COMMUNITY): Payer: Self-pay

## 2019-03-16 ENCOUNTER — Other Ambulatory Visit: Payer: Self-pay

## 2019-03-16 VITALS — BP 100/0 | HR 98 | Ht 68.0 in | Wt 194.0 lb

## 2019-03-16 DIAGNOSIS — I252 Old myocardial infarction: Secondary | ICD-10-CM | POA: Diagnosis not present

## 2019-03-16 DIAGNOSIS — I2510 Atherosclerotic heart disease of native coronary artery without angina pectoris: Secondary | ICD-10-CM | POA: Diagnosis not present

## 2019-03-16 DIAGNOSIS — N200 Calculus of kidney: Secondary | ICD-10-CM | POA: Diagnosis not present

## 2019-03-16 DIAGNOSIS — M459 Ankylosing spondylitis of unspecified sites in spine: Secondary | ICD-10-CM | POA: Insufficient documentation

## 2019-03-16 DIAGNOSIS — Z8249 Family history of ischemic heart disease and other diseases of the circulatory system: Secondary | ICD-10-CM | POA: Insufficient documentation

## 2019-03-16 DIAGNOSIS — Z95811 Presence of heart assist device: Secondary | ICD-10-CM | POA: Insufficient documentation

## 2019-03-16 DIAGNOSIS — Z7982 Long term (current) use of aspirin: Secondary | ICD-10-CM | POA: Diagnosis not present

## 2019-03-16 DIAGNOSIS — Z87891 Personal history of nicotine dependence: Secondary | ICD-10-CM | POA: Diagnosis not present

## 2019-03-16 DIAGNOSIS — I5022 Chronic systolic (congestive) heart failure: Secondary | ICD-10-CM | POA: Diagnosis not present

## 2019-03-16 DIAGNOSIS — I255 Ischemic cardiomyopathy: Secondary | ICD-10-CM | POA: Insufficient documentation

## 2019-03-16 DIAGNOSIS — Z86718 Personal history of other venous thrombosis and embolism: Secondary | ICD-10-CM | POA: Insufficient documentation

## 2019-03-16 DIAGNOSIS — Z5181 Encounter for therapeutic drug level monitoring: Secondary | ICD-10-CM

## 2019-03-16 DIAGNOSIS — Z86711 Personal history of pulmonary embolism: Secondary | ICD-10-CM | POA: Diagnosis not present

## 2019-03-16 DIAGNOSIS — I10 Essential (primary) hypertension: Secondary | ICD-10-CM

## 2019-03-16 DIAGNOSIS — Z955 Presence of coronary angioplasty implant and graft: Secondary | ICD-10-CM | POA: Insufficient documentation

## 2019-03-16 DIAGNOSIS — Z8674 Personal history of sudden cardiac arrest: Secondary | ICD-10-CM | POA: Diagnosis not present

## 2019-03-16 DIAGNOSIS — Z7901 Long term (current) use of anticoagulants: Secondary | ICD-10-CM | POA: Diagnosis not present

## 2019-03-16 DIAGNOSIS — E785 Hyperlipidemia, unspecified: Secondary | ICD-10-CM | POA: Insufficient documentation

## 2019-03-16 DIAGNOSIS — E119 Type 2 diabetes mellitus without complications: Secondary | ICD-10-CM | POA: Diagnosis not present

## 2019-03-16 DIAGNOSIS — Z79899 Other long term (current) drug therapy: Secondary | ICD-10-CM | POA: Diagnosis not present

## 2019-03-16 DIAGNOSIS — I110 Hypertensive heart disease with heart failure: Secondary | ICD-10-CM | POA: Insufficient documentation

## 2019-03-16 LAB — COMPREHENSIVE METABOLIC PANEL
ALT: 18 U/L (ref 0–44)
AST: 26 U/L (ref 15–41)
Albumin: 4.2 g/dL (ref 3.5–5.0)
Alkaline Phosphatase: 121 U/L (ref 38–126)
Anion gap: 10 (ref 5–15)
BUN: 15 mg/dL (ref 8–23)
CO2: 23 mmol/L (ref 22–32)
Calcium: 9.3 mg/dL (ref 8.9–10.3)
Chloride: 105 mmol/L (ref 98–111)
Creatinine, Ser: 1.08 mg/dL (ref 0.61–1.24)
GFR calc Af Amer: >60 mL/min (ref >60)
GFR calc non Af Amer: >60 mL/min (ref >60)
Glucose, Bld: 79 mg/dL (ref 70–99)
Potassium: 4.5 mmol/L (ref 3.5–5.1)
Sodium: 138 mmol/L (ref 135–145)
Total Bilirubin: 0.6 mg/dL (ref 0.3–1.2)
Total Protein: 8.8 g/dL — ABNORMAL HIGH (ref 6.5–8.1)

## 2019-03-16 LAB — CBC
HCT: 40.8 % (ref 39.0–52.0)
Hemoglobin: 12 g/dL — ABNORMAL LOW (ref 13.0–17.0)
MCH: 23.9 pg — ABNORMAL LOW (ref 26.0–34.0)
MCHC: 29.4 g/dL — ABNORMAL LOW (ref 30.0–36.0)
MCV: 81.1 fL (ref 80.0–100.0)
Platelets: 457 10*3/uL — ABNORMAL HIGH (ref 150–400)
RBC: 5.03 MIL/uL (ref 4.22–5.81)
RDW: 18.2 % — ABNORMAL HIGH (ref 11.5–15.5)
WBC: 10.3 10*3/uL (ref 4.0–10.5)
nRBC: 0 % (ref 0.0–0.2)

## 2019-03-16 LAB — PROTIME-INR
INR: 3 — ABNORMAL HIGH (ref 0.8–1.2)
Prothrombin Time: 30.6 seconds — ABNORMAL HIGH (ref 11.4–15.2)

## 2019-03-16 LAB — LACTATE DEHYDROGENASE: LDH: 308 U/L — ABNORMAL HIGH (ref 98–192)

## 2019-03-16 LAB — IRON AND TIBC
Iron: 32 ug/dL — ABNORMAL LOW (ref 45–182)
Saturation Ratios: 6 % — ABNORMAL LOW (ref 17.9–39.5)
TIBC: 496 ug/dL — ABNORMAL HIGH (ref 250–450)
UIBC: 464 ug/dL

## 2019-03-16 LAB — FERRITIN: Ferritin: 13 ng/mL — ABNORMAL LOW (ref 24–336)

## 2019-03-16 LAB — PREALBUMIN: Prealbumin: 17.9 mg/dL — ABNORMAL LOW (ref 18–38)

## 2019-03-16 LAB — VITAMIN B12: Vitamin B-12: 267 pg/mL (ref 180–914)

## 2019-03-16 LAB — FOLATE: Folate: 12.5 ng/mL (ref >5.9)

## 2019-03-16 NOTE — Progress Notes (Signed)
Patient ID: Scott [name], male   DOB: 05-14-1946, 73 y.o.   MRN: [id number]     Primary Cardiologist: Dr Haroldine Laws INR : [facility]  HPI: Scott [name] is a 73 y/o male with h/o CAD with ankylosing spondylitis, DM2, carotid stenting, severe ischemic CM s/p anterior STEM with VT arrest in 12/15, large PE with pulmonary infarct in 2/15 s/p IVC filter and systolic HF with EF [id number].  He underwent HM II LVAD placement on 09/20/13. He had a prolonged hospital course due to recurrent chest bleeding, severe CO2 retention and RV failure.  In 8/18 admitted with acute gallstone pancreatitis. Underwent lap chole 02/21/17.   Admitted 2/18 with elevated LDH. He was started on bivalirudin. LDH went down [phone].   Today he returns for 3 month VAD follow up. He is here with his wife. Continues to feel well. Does all his activities without too much difficulty. Denies orthopnea or PND. No fevers, chills or problems with driveline. No bleeding, melena or neuro symptoms. No VAD alarms. Taking all meds as prescribed. Follows weight very closely and weight always within 1-2 pounds.    VAD Indication: Destination Therapy patient choice    VAD interrogation & Equipment Management: Speed:9200 Flow: 6.2 Power: 6.1w PI: 5.7  Alarms: few low voltage Events: 3-5 PI events per day  Fixed speed 9200 Low speed limit: 8600  Primary back-up battery expiring in 19 months Back up Controller: Expiring in 19 mo  Annual Equipment Maintenance on UBC/PM was performed on 12/2018.  SH:  Social History   Socioeconomic History  . Marital status: Married    Spouse name: Not on file  . Number of children: Not on file  . Years of education: Not on file  . Highest education level: Not on file  Occupational History  . Occupation: retired  Scientific laboratory technician  . Financial resource strain: Not on file  . Food insecurity    Worry: Not on file    Inability: Not on file  . Transportation needs    Medical: Not on  file    Non-medical: Not on file  Tobacco Use  . Smoking status: Former Smoker    Packs/day: 0.30    Years: 0.00    Pack years: 0.00    Types: Cigarettes    Quit date: 06/12/1969    Years since quitting: 49.7  . Smokeless tobacco: Never Used  . 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  Substance and Sexual Activity  . Alcohol use: No    Alcohol/week: 0.0 standard drinks  . Drug use: No  . Sexual activity: Not Currently  Lifestyle  . Physical activity    Days per week: Not on file    Minutes per session: Not on file  . Stress: Not on file  Relationships  . Social Herbalist on phone: Not on file    Gets together: Not on file    Attends religious service: Not on file    Active member of club or organization: Not on file    Attends meetings of clubs or organizations: Not on file    Relationship status: Not on file  . Intimate partner violence    Fear of current or ex partner: Not on file    Emotionally abused: Not on file    Physically abused: Not on file    Forced sexual activity: Not on file  Other Topics Concern  . Not on file  Social History Narrative  .  Not on file    FH:  Family History  Problem Relation Age of Onset  . Hypertension Father   . Heart attack Father   . Heart Problems Sister     Past Medical History:  Diagnosis Date  . Acute on chronic respiratory failure ([place])    a. 08/2014 in setting of PE.  Marland Kitchen Ankylosing spondylitis ([place])   . Bilateral pulmonary embolism ([place])    a. 08/2014 - started on Coumadin. Retrievable IVC filter placed 08/27/14 due to RV strain and large clot burden.  Marland Kitchen CAD (coronary artery disease)    a. stenting of LCx 2013; b. STEMI 06/12/14 s/p PCI to LAD complicated by post cath shock requiring IABP; VT s/p DCCV, EF 20%; c. NSTEMI 06/26/14 treated medically.  . Carotid artery disease ([place])    a. s/p stenting.  . Chronic systolic CHF (congestive heart failure) ([place])   . Diabetes ([place])   .  Hemoptysis    a. 08/2014 possibly due to pulm infarct/PE.  Marland Kitchen Hyperlipidemia   . Hypertension   . Hypotension   . Ischemic cardiomyopathy    a. echo 08/23/2014 EF <20%, dilated CM, mod MR/TR  . Leukocytosis   . Leukocytosis 09/10/2014  . Nephrolithiasis   . Pleural effusion on right 08/2014 - small  . Reactive thrombocytosis 09/10/2014  . Right leg DVT ([place])    a. 08/2014.  Marland Kitchen Ventricular tachycardia ([place])    a. 06/2014 at time of MI, s/p DCCV.   Current Outpatient Medications  Medication Sig Dispense Refill  . acetaminophen (TYLENOL) 500 MG tablet Take 1,000 mg by mouth every 6 (six) hours as needed for moderate pain.     Marland Kitchen aspirin EC 81 MG tablet Take 1 tablet (81 mg total) by mouth daily. 90 tablet 3  . clopidogrel (PLAVIX) 75 MG tablet Take 1 tablet (75 mg total) by mouth daily. 90 tablet 3  . cyclobenzaprine (FLEXERIL) 5 MG tablet Take 1 tablet (5 mg total) by mouth 3 (three) times daily as needed for muscle spasms. 270 tablet 3  . furosemide (LASIX) 20 MG tablet Every other day alternate 20 mg (1 pill) with 40 mg (2 pills) 135 tablet 3  . pantoprazole (PROTONIX) 40 MG tablet Take 1 tablet (40 mg total) by mouth at bedtime. 90 tablet 3  . potassium chloride SA (K-DUR,KLOR-CON) 20 MEQ tablet Every other day alternate 20 mEq (1 tablet) with 40 mEq (2 tablets) 135 tablet 3  . rosuvastatin (CRESTOR) 10 MG tablet Take 1 tablet (10 mg total) by mouth at bedtime. 90 tablet 3  . traMADol (ULTRAM) 50 MG tablet Take 2 tablets (100 mg total) by mouth every 6 (six) hours as needed for moderate pain. (Patient taking differently: Take 50 mg by mouth every 6 (six) hours as needed for moderate pain. ) 120 tablet 3  . traZODone (DESYREL) 100 MG tablet Take 1 tablet (100 mg total) by mouth at bedtime. 90 tablet 6  . warfarin (COUMADIN) 6 MG tablet Take 1 tablet (6 mg total) by mouth See admin instructions. Take 1 tablet (6 mg) daily except 1 and 1/2 tablets (9 mg) on wednesday (Patient taking differently:  Take 6 mg by mouth daily. Warfarin  6mg  daily or as directed by HF clinic) 120 tablet 3  . sildenafil (VIAGRA) 100 MG tablet Take 1 tablet (100 mg total) by mouth daily as needed for erectile dysfunction. (Patient not taking: Reported on 06/14/2018) 10 tablet 3   No current facility-administered medications for this encounter.  Vital Signs:  Doppler Pressure: 100 Automatc BP:  101/79 (87) HR: 98 SPO2: 98%  Weight: 194.0 lb w/o eqt Last weight: 195 lbs Home weights: 185 - 188 lbs  Wt Readings from Last 3 Encounters:  03/16/19 88 kg (194 lb)  12/15/18 88.5 kg (195 lb)  08/04/18 89 kg (196 lb 3.2 oz)   Physical Exam: General:  NAD.  HEENT: normal  Neck: supple. JVP not elevated.  Carotids 2+ bilat; no bruits. No lymphadenopathy or thryomegaly appreciated. Cor: LVAD hum.  Lungs: Clear. Abdomen:  soft, nontender, non-distended. No hepatosplenomegaly. No bruits or masses. Good bowel sounds. Driveline site clean. Anchor in place.  Extremities: no cyanosis, clubbing, rash. Warm no edema  Neuro: alert & oriented x 3. No focal deficits. Moves all 4 without problem    ASSESSMENT & PLAN:  1. Chronic systolic HF: Ischemic cardiomyopathy, EF 20% s/p HMII LVAD 09/20/13.   - Doing very well over 5.5 years out.  - NYHA I-II with VAD support.   - Volume status looks good.  Continue current dose of lasix.   - Continue to refuse transplant evaluation.   2. CAD s/p Anterior STEMI on 06/12/14 with stenting of LAD:  - No s/s ischemia - Continue statin. Off ASA due to bleeding.   3. VAD management:  - Had admit in 08/2017 for elevated LDH.  - LDH stabilized 300-380. No other signs/sx of pump thrombosis -VAD interrogated personally. Parameters stable.. - Off asa. On coumadin.  - Hgb 12.0 - INR 3.0 Discussed dosing with PharmD personally. - LDH 308   4. Ankylosing spondylitis: Off NSAIDS. -  Follows with rheumatology. Uses tramadol. Avoids NSAIDs as much as possible. -  Pain  remains well controlled   5. HTN:  -Blood pressure well controlled. Continue current regimen.  6. Kidney Stones  - CT with renal calculi bilaterally. No intervention needed at this time.  Now followed by Dr Tresa Moore at [facility]. - Continue f/u  Total time spent 35 minutes. Over half that time spent discussing above.  Erlinda Hong, MD  03/16/2019

## 2019-03-16 NOTE — Progress Notes (Signed)
Patient presents for 3 month follow up in [facility] today with wife. Reports no problems with VAD equipment or concerns with drive line.  Pt states that he has been feeling good since his last visit. Denies shortness of breath, edema, dark urine, and bleeding.   Vital Signs:  Doppler Pressure: 100 Automatc BP:  101/79 (87) HR: 98 SPO2: 98%  Weight: 194.0 lb w/o eqt Last weight: 195 lbs Home weights: 185 - 188 lbs   VAD Indication: Destination Therapy patient choice    VAD interrogation & Equipment Management: Speed:9200 Flow: 6.2 Power: 6.1w    PI: 5.7  Alarms: few low voltage Events: 3-5 PI events per day  Fixed speed 9200 Low speed limit: 8600  Primary back-up battery expiring in 19 months Back up Controller: Expiring in 19 mo  Annual Equipment Maintenance on UBC/PM was performed on 12/2018.   I reviewed the LVAD parameters from today and compared the results to the patient's prior recorded data. LVAD interrogation was NEGATIVE for significant power changes, NEGATIVE for clinical alarms and STABLE for PI events/speed drops. No programming changes were made and pump is functioning within specified parameters. Pt is performing daily controller and system monitor self tests along with completing weekly and monthly maintenance for LVAD equipment.  LVAD equipment check completed and is in good working order. Back-up equipment not present.   Exit Site Care: Drive line is being maintained weekly  by his wife, [name]. Drive line exit site well healed and incorporated. The velour is fully implanted at exit site. Dressing dry and intact. Slight erythema, scant amount of brown drainage. Stabilization device present and accurately applied. Pt denies fever or chills. Given 12 anchors, 12 sorbaview dressings and 1 box of saline wipes.   Significant Events on VAD Support:  02/21/17> lap chole  Device:none   BP & Labs:  MAP 100 - Doppler is reflecting modified  systolic  Hgb 12 - No S/S of bleeding. Specifically denies melena/BRBPR or nosebleeds.  LDH 308 established baseline of 250- 320. Denies tea-colored urine. No power elevations noted on interrogation. We will continue to check this with his INR.     Patient Instructions:  1. No medication changes today. 2. Return to clinic in 2 weeks for labs and 3 months for full visit.   [name] RN [place] Coordinator  Office: [phone]  24/7 Pager: [phone]

## 2019-03-16 NOTE — Progress Notes (Signed)
LVAD INR 

## 2019-03-26 ENCOUNTER — Other Ambulatory Visit (HOSPITAL_COMMUNITY): Payer: Self-pay | Admitting: *Deleted

## 2019-03-26 DIAGNOSIS — Z95811 Presence of heart assist device: Secondary | ICD-10-CM

## 2019-03-26 DIAGNOSIS — Z7901 Long term (current) use of anticoagulants: Secondary | ICD-10-CM

## 2019-04-01 ENCOUNTER — Other Ambulatory Visit: Payer: Self-pay

## 2019-04-01 ENCOUNTER — Ambulatory Visit (HOSPITAL_COMMUNITY)
Admission: RE | Admit: 2019-04-01 | Discharge: 2019-04-01 | Disposition: A | Discharge status: Home / Self Care | Payer: Medicare Other | Source: Ambulatory Visit | Attending: Internal Medicine | Admitting: Internal Medicine

## 2019-04-01 ENCOUNTER — Ambulatory Visit (HOSPITAL_COMMUNITY): Payer: Self-pay | Admitting: Pharmacist

## 2019-04-01 DIAGNOSIS — Z95811 Presence of heart assist device: Secondary | ICD-10-CM

## 2019-04-01 DIAGNOSIS — Z5181 Encounter for therapeutic drug level monitoring: Secondary | ICD-10-CM

## 2019-04-01 DIAGNOSIS — Z7901 Long term (current) use of anticoagulants: Secondary | ICD-10-CM | POA: Insufficient documentation

## 2019-04-01 LAB — LACTATE DEHYDROGENASE: LDH: 307 U/L — ABNORMAL HIGH (ref 98–192)

## 2019-04-01 LAB — PROTIME-INR
INR: 3.2 — ABNORMAL HIGH (ref 0.8–1.2)
Prothrombin Time: 31.9 seconds — ABNORMAL HIGH (ref 11.4–15.2)

## 2019-04-01 NOTE — Progress Notes (Signed)
LVAD INR 

## 2019-04-02 ENCOUNTER — Other Ambulatory Visit (HOSPITAL_COMMUNITY): Payer: Self-pay | Admitting: Unknown Physician Specialty

## 2019-04-02 DIAGNOSIS — D649 Anemia, unspecified: Secondary | ICD-10-CM

## 2019-04-10 ENCOUNTER — Other Ambulatory Visit (HOSPITAL_COMMUNITY): Payer: Self-pay | Admitting: *Deleted

## 2019-04-10 DIAGNOSIS — Z95811 Presence of heart assist device: Secondary | ICD-10-CM

## 2019-04-10 DIAGNOSIS — Z7901 Long term (current) use of anticoagulants: Secondary | ICD-10-CM

## 2019-04-15 ENCOUNTER — Other Ambulatory Visit: Payer: Self-pay

## 2019-04-15 ENCOUNTER — Ambulatory Visit (HOSPITAL_COMMUNITY)
Admission: RE | Admit: 2019-04-15 | Discharge: 2019-04-15 | Disposition: A | Discharge status: Home / Self Care | Payer: Medicare Other | Source: Ambulatory Visit | Attending: Internal Medicine | Admitting: Internal Medicine

## 2019-04-15 ENCOUNTER — Ambulatory Visit (HOSPITAL_COMMUNITY): Payer: Self-pay | Admitting: Pharmacist

## 2019-04-15 DIAGNOSIS — Z5181 Encounter for therapeutic drug level monitoring: Secondary | ICD-10-CM

## 2019-04-15 DIAGNOSIS — Z7901 Long term (current) use of anticoagulants: Secondary | ICD-10-CM | POA: Insufficient documentation

## 2019-04-15 DIAGNOSIS — Z95811 Presence of heart assist device: Secondary | ICD-10-CM | POA: Diagnosis not present

## 2019-04-15 DIAGNOSIS — D649 Anemia, unspecified: Secondary | ICD-10-CM | POA: Diagnosis not present

## 2019-04-15 LAB — PROTIME-INR
INR: 2.8 — ABNORMAL HIGH (ref 0.8–1.2)
Prothrombin Time: 29.1 seconds — ABNORMAL HIGH (ref 11.4–15.2)

## 2019-04-15 LAB — LACTATE DEHYDROGENASE: LDH: 345 U/L — ABNORMAL HIGH (ref 98–192)

## 2019-04-15 MED ORDER — SODIUM CHLORIDE 0.9 % IV SOLN
510.0000 mg | INTRAVENOUS | Status: DC
  Administered 2019-04-15: 510 mg via INTRAVENOUS
  Filled 2019-04-15: qty 17

## 2019-04-15 NOTE — Progress Notes (Signed)
LVAD INR 

## 2019-04-22 ENCOUNTER — Other Ambulatory Visit: Payer: Self-pay

## 2019-04-22 ENCOUNTER — Ambulatory Visit (HOSPITAL_COMMUNITY)
Admission: RE | Admit: 2019-04-22 | Discharge: 2019-04-22 | Disposition: A | Discharge status: Home / Self Care | Payer: Medicare Other | Source: Ambulatory Visit | Attending: Internal Medicine | Admitting: Internal Medicine

## 2019-04-22 DIAGNOSIS — D649 Anemia, unspecified: Secondary | ICD-10-CM | POA: Diagnosis not present

## 2019-04-22 MED ORDER — SODIUM CHLORIDE 0.9 % IV SOLN
510.0000 mg | INTRAVENOUS | Status: DC
  Administered 2019-04-22: 510 mg via INTRAVENOUS
  Filled 2019-04-22: qty 510

## 2019-04-24 ENCOUNTER — Other Ambulatory Visit (HOSPITAL_COMMUNITY): Payer: Self-pay | Admitting: Unknown Physician Specialty

## 2019-04-24 DIAGNOSIS — Z7901 Long term (current) use of anticoagulants: Secondary | ICD-10-CM

## 2019-04-24 DIAGNOSIS — Z95811 Presence of heart assist device: Secondary | ICD-10-CM

## 2019-04-29 ENCOUNTER — Other Ambulatory Visit: Payer: Self-pay

## 2019-04-29 ENCOUNTER — Ambulatory Visit (HOSPITAL_COMMUNITY): Payer: Self-pay | Admitting: Pharmacist

## 2019-04-29 ENCOUNTER — Ambulatory Visit (HOSPITAL_COMMUNITY)
Admission: RE | Admit: 2019-04-29 | Discharge: 2019-04-29 | Disposition: A | Discharge status: Home / Self Care | Payer: Medicare Other | Source: Ambulatory Visit | Attending: Internal Medicine | Admitting: Internal Medicine

## 2019-04-29 DIAGNOSIS — Z95811 Presence of heart assist device: Secondary | ICD-10-CM | POA: Diagnosis not present

## 2019-04-29 DIAGNOSIS — Z7901 Long term (current) use of anticoagulants: Secondary | ICD-10-CM | POA: Diagnosis not present

## 2019-04-29 DIAGNOSIS — Z5181 Encounter for therapeutic drug level monitoring: Secondary | ICD-10-CM

## 2019-04-29 LAB — PROTIME-INR
INR: 2.5 — ABNORMAL HIGH (ref 0.8–1.2)
Prothrombin Time: 26.7 seconds — ABNORMAL HIGH (ref 11.4–15.2)

## 2019-04-29 LAB — LACTATE DEHYDROGENASE: LDH: 323 U/L — ABNORMAL HIGH (ref 98–192)

## 2019-04-29 NOTE — Progress Notes (Signed)
LVAD INR 

## 2019-05-07 ENCOUNTER — Other Ambulatory Visit (HOSPITAL_COMMUNITY): Payer: Self-pay | Admitting: *Deleted

## 2019-05-07 DIAGNOSIS — Z7901 Long term (current) use of anticoagulants: Secondary | ICD-10-CM

## 2019-05-07 DIAGNOSIS — Z95811 Presence of heart assist device: Secondary | ICD-10-CM

## 2019-05-13 ENCOUNTER — Ambulatory Visit (HOSPITAL_COMMUNITY)
Admission: RE | Admit: 2019-05-13 | Discharge: 2019-05-13 | Disposition: A | Discharge status: Home / Self Care | Payer: Medicare Other | Source: Ambulatory Visit | Attending: Cardiology | Admitting: Cardiology

## 2019-05-13 ENCOUNTER — Other Ambulatory Visit: Payer: Self-pay

## 2019-05-13 ENCOUNTER — Ambulatory Visit (HOSPITAL_COMMUNITY): Payer: Self-pay | Admitting: Pharmacist

## 2019-05-13 DIAGNOSIS — Z95811 Presence of heart assist device: Secondary | ICD-10-CM | POA: Insufficient documentation

## 2019-05-13 DIAGNOSIS — Z5181 Encounter for therapeutic drug level monitoring: Secondary | ICD-10-CM

## 2019-05-13 DIAGNOSIS — Z7901 Long term (current) use of anticoagulants: Secondary | ICD-10-CM | POA: Diagnosis not present

## 2019-05-13 LAB — LACTATE DEHYDROGENASE: LDH: 365 U/L — ABNORMAL HIGH (ref 98–192)

## 2019-05-13 LAB — PROTIME-INR
INR: 2.7 — ABNORMAL HIGH (ref 0.8–1.2)
Prothrombin Time: 28.1 seconds — ABNORMAL HIGH (ref 11.4–15.2)

## 2019-05-13 NOTE — Progress Notes (Signed)
LVAD INR 

## 2019-05-22 ENCOUNTER — Other Ambulatory Visit (HOSPITAL_COMMUNITY): Payer: Self-pay | Admitting: *Deleted

## 2019-05-22 DIAGNOSIS — Z95811 Presence of heart assist device: Secondary | ICD-10-CM

## 2019-05-22 DIAGNOSIS — Z7901 Long term (current) use of anticoagulants: Secondary | ICD-10-CM

## 2019-05-25 ENCOUNTER — Telehealth (HOSPITAL_COMMUNITY): Payer: Self-pay | Admitting: *Deleted

## 2019-05-25 DIAGNOSIS — Z95811 Presence of heart assist device: Secondary | ICD-10-CM

## 2019-05-25 DIAGNOSIS — T827XXA Infection and inflammatory reaction due to other cardiac and vascular devices, implants and grafts, initial encounter: Secondary | ICD-10-CM

## 2019-05-25 MED ORDER — DOXYCYCLINE HYCLATE 50 MG PO CAPS: 100.0000 mg | ORAL_CAPSULE | Freq: Two times a day (BID) | ORAL | 0 refills | Status: DC

## 2019-05-25 NOTE — Telephone Encounter (Signed)
Wife called to ask for extra weekly dressing kits this Wednesday when patient comes for lab work.  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 After trauma, she reports site has bloody drainage, she switches to daily dressing kits and changes daily, then every 2 - 3 days until site is healed. She says he then pulls DL again and she starts over. This episode as she tries to extend dressing changes to 2 - 3 days, she reports a foul odor.   Dr. Haroldine Laws updated, Rx sent to local pharmacy for Doxy 100 mg twice daily x 7 days.   Called pt and [name] to inform of above; cautioned on sunlight effects with Doxy. Asked [name] to call after pt completes antibiotic if no improvement or symptoms worsen. She verbalizes agreement to same.   Updated Audry Riles, PhamrD due to potential interaction with warfarin dosing.   Zada Girt RN, [place] Coordinator [phone]

## 2019-05-27 ENCOUNTER — Ambulatory Visit (HOSPITAL_COMMUNITY)
Admission: RE | Admit: 2019-05-27 | Discharge: 2019-05-27 | Disposition: A | Discharge status: Home / Self Care | Payer: Medicare Other | Source: Ambulatory Visit | Attending: Internal Medicine | Admitting: Internal Medicine

## 2019-05-27 ENCOUNTER — Ambulatory Visit (HOSPITAL_COMMUNITY): Payer: Self-pay | Admitting: Pharmacist

## 2019-05-27 ENCOUNTER — Other Ambulatory Visit: Payer: Self-pay

## 2019-05-27 DIAGNOSIS — Z95811 Presence of heart assist device: Secondary | ICD-10-CM

## 2019-05-27 DIAGNOSIS — Z7901 Long term (current) use of anticoagulants: Secondary | ICD-10-CM | POA: Insufficient documentation

## 2019-05-27 DIAGNOSIS — Z5181 Encounter for therapeutic drug level monitoring: Secondary | ICD-10-CM

## 2019-05-27 LAB — PROTIME-INR
INR: 2.6 — ABNORMAL HIGH (ref 0.8–1.2)
Prothrombin Time: 27.4 seconds — ABNORMAL HIGH (ref 11.4–15.2)

## 2019-05-27 LAB — LACTATE DEHYDROGENASE: LDH: 307 U/L — ABNORMAL HIGH (ref 98–192)

## 2019-05-27 NOTE — Progress Notes (Signed)
LVAD INR 

## 2019-06-04 ENCOUNTER — Other Ambulatory Visit (HOSPITAL_COMMUNITY): Payer: Self-pay | Admitting: Adult Health

## 2019-06-04 DIAGNOSIS — I5041 Acute combined systolic (congestive) and diastolic (congestive) heart failure: Secondary | ICD-10-CM

## 2019-06-04 DIAGNOSIS — Z95811 Presence of heart assist device: Secondary | ICD-10-CM

## 2019-06-04 DIAGNOSIS — I5023 Acute on chronic systolic (congestive) heart failure: Secondary | ICD-10-CM

## 2019-06-04 DIAGNOSIS — Z79899 Other long term (current) drug therapy: Secondary | ICD-10-CM

## 2019-06-04 DIAGNOSIS — I2510 Atherosclerotic heart disease of native coronary artery without angina pectoris: Secondary | ICD-10-CM

## 2019-06-04 DIAGNOSIS — R0609 Other forms of dyspnea: Secondary | ICD-10-CM

## 2019-06-12 ENCOUNTER — Other Ambulatory Visit (HOSPITAL_COMMUNITY): Payer: Self-pay | Admitting: Unknown Physician Specialty

## 2019-06-12 ENCOUNTER — Other Ambulatory Visit: Payer: Self-pay | Admitting: Unknown Physician Specialty

## 2019-06-12 DIAGNOSIS — Z95811 Presence of heart assist device: Secondary | ICD-10-CM

## 2019-06-12 DIAGNOSIS — Z7901 Long term (current) use of anticoagulants: Secondary | ICD-10-CM

## 2019-06-12 NOTE — Progress Notes (Signed)
error 

## 2019-06-15 ENCOUNTER — Other Ambulatory Visit: Payer: Self-pay

## 2019-06-15 ENCOUNTER — Ambulatory Visit (HOSPITAL_COMMUNITY): Payer: Self-pay | Admitting: Pharmacist

## 2019-06-15 ENCOUNTER — Encounter (HOSPITAL_COMMUNITY): Payer: Self-pay | Admitting: *Deleted

## 2019-06-15 ENCOUNTER — Encounter (HOSPITAL_COMMUNITY): Payer: Self-pay

## 2019-06-15 ENCOUNTER — Ambulatory Visit (HOSPITAL_COMMUNITY)
Admission: RE | Admit: 2019-06-15 | Discharge: 2019-06-15 | Disposition: A | Discharge status: Home / Self Care | Payer: Medicare Other | Source: Ambulatory Visit | Attending: Cardiology | Admitting: Cardiology

## 2019-06-15 VITALS — BP 112/61 | HR 107 | Temp 98.1°F | Ht 68.0 in | Wt 198.0 lb

## 2019-06-15 DIAGNOSIS — R0600 Dyspnea, unspecified: Secondary | ICD-10-CM | POA: Diagnosis not present

## 2019-06-15 DIAGNOSIS — I5022 Chronic systolic (congestive) heart failure: Secondary | ICD-10-CM | POA: Insufficient documentation

## 2019-06-15 DIAGNOSIS — Z95811 Presence of heart assist device: Secondary | ICD-10-CM | POA: Insufficient documentation

## 2019-06-15 DIAGNOSIS — I110 Hypertensive heart disease with heart failure: Secondary | ICD-10-CM | POA: Insufficient documentation

## 2019-06-15 DIAGNOSIS — R0609 Other forms of dyspnea: Secondary | ICD-10-CM

## 2019-06-15 DIAGNOSIS — I5041 Acute combined systolic (congestive) and diastolic (congestive) heart failure: Secondary | ICD-10-CM

## 2019-06-15 DIAGNOSIS — I2510 Atherosclerotic heart disease of native coronary artery without angina pectoris: Secondary | ICD-10-CM | POA: Insufficient documentation

## 2019-06-15 DIAGNOSIS — I252 Old myocardial infarction: Secondary | ICD-10-CM | POA: Insufficient documentation

## 2019-06-15 DIAGNOSIS — I255 Ischemic cardiomyopathy: Secondary | ICD-10-CM | POA: Insufficient documentation

## 2019-06-15 DIAGNOSIS — Z5181 Encounter for therapeutic drug level monitoring: Secondary | ICD-10-CM

## 2019-06-15 DIAGNOSIS — Z8249 Family history of ischemic heart disease and other diseases of the circulatory system: Secondary | ICD-10-CM | POA: Diagnosis not present

## 2019-06-15 DIAGNOSIS — E785 Hyperlipidemia, unspecified: Secondary | ICD-10-CM | POA: Diagnosis not present

## 2019-06-15 DIAGNOSIS — Z7982 Long term (current) use of aspirin: Secondary | ICD-10-CM | POA: Diagnosis not present

## 2019-06-15 DIAGNOSIS — M459 Ankylosing spondylitis of unspecified sites in spine: Secondary | ICD-10-CM | POA: Diagnosis not present

## 2019-06-15 DIAGNOSIS — Z8674 Personal history of sudden cardiac arrest: Secondary | ICD-10-CM | POA: Diagnosis not present

## 2019-06-15 DIAGNOSIS — E119 Type 2 diabetes mellitus without complications: Secondary | ICD-10-CM | POA: Diagnosis not present

## 2019-06-15 DIAGNOSIS — Z955 Presence of coronary angioplasty implant and graft: Secondary | ICD-10-CM | POA: Insufficient documentation

## 2019-06-15 DIAGNOSIS — Z79899 Other long term (current) drug therapy: Secondary | ICD-10-CM | POA: Diagnosis not present

## 2019-06-15 DIAGNOSIS — I480 Paroxysmal atrial fibrillation: Secondary | ICD-10-CM

## 2019-06-15 DIAGNOSIS — Z7901 Long term (current) use of anticoagulants: Secondary | ICD-10-CM | POA: Insufficient documentation

## 2019-06-15 DIAGNOSIS — Z86711 Personal history of pulmonary embolism: Secondary | ICD-10-CM | POA: Diagnosis not present

## 2019-06-15 DIAGNOSIS — G479 Sleep disorder, unspecified: Secondary | ICD-10-CM | POA: Diagnosis not present

## 2019-06-15 DIAGNOSIS — N200 Calculus of kidney: Secondary | ICD-10-CM | POA: Diagnosis not present

## 2019-06-15 DIAGNOSIS — Z7902 Long term (current) use of antithrombotics/antiplatelets: Secondary | ICD-10-CM | POA: Insufficient documentation

## 2019-06-15 DIAGNOSIS — G8929 Other chronic pain: Secondary | ICD-10-CM

## 2019-06-15 DIAGNOSIS — I5023 Acute on chronic systolic (congestive) heart failure: Secondary | ICD-10-CM

## 2019-06-15 DIAGNOSIS — Z86718 Personal history of other venous thrombosis and embolism: Secondary | ICD-10-CM | POA: Diagnosis not present

## 2019-06-15 DIAGNOSIS — I5043 Acute on chronic combined systolic (congestive) and diastolic (congestive) heart failure: Secondary | ICD-10-CM

## 2019-06-15 DIAGNOSIS — Z87891 Personal history of nicotine dependence: Secondary | ICD-10-CM | POA: Insufficient documentation

## 2019-06-15 LAB — BASIC METABOLIC PANEL
Anion gap: 9 (ref 5–15)
BUN: 16 mg/dL (ref 8–23)
CO2: 25 mmol/L (ref 22–32)
Calcium: 9.3 mg/dL (ref 8.9–10.3)
Chloride: 105 mmol/L (ref 98–111)
Creatinine, Ser: 1.05 mg/dL (ref 0.61–1.24)
GFR calc Af Amer: >60 mL/min (ref >60)
GFR calc non Af Amer: >60 mL/min (ref >60)
Glucose, Bld: 92 mg/dL (ref 70–99)
Potassium: 4.2 mmol/L (ref 3.5–5.1)
Sodium: 139 mmol/L (ref 135–145)

## 2019-06-15 LAB — CBC
HCT: 46 % (ref 39.0–52.0)
Hemoglobin: 14.7 g/dL (ref 13.0–17.0)
MCH: 28.7 pg (ref 26.0–34.0)
MCHC: 32 g/dL (ref 30.0–36.0)
MCV: 89.7 fL (ref 80.0–100.0)
Platelets: 282 10*3/uL (ref 150–400)
RBC: 5.13 MIL/uL (ref 4.22–5.81)
RDW: 21 % — ABNORMAL HIGH (ref 11.5–15.5)
WBC: 9 10*3/uL (ref 4.0–10.5)
nRBC: 0 % (ref 0.0–0.2)

## 2019-06-15 LAB — PROTIME-INR
INR: 2.7 — ABNORMAL HIGH (ref 0.8–1.2)
Prothrombin Time: 28.4 seconds — ABNORMAL HIGH (ref 11.4–15.2)

## 2019-06-15 LAB — LACTATE DEHYDROGENASE: LDH: 285 U/L — ABNORMAL HIGH (ref 98–192)

## 2019-06-15 MED ORDER — PANTOPRAZOLE SODIUM 40 MG PO TBEC: 40.0000 mg | DELAYED_RELEASE_TABLET | Freq: Every day | ORAL | 3 refills | Status: DC

## 2019-06-15 MED ORDER — WARFARIN SODIUM 6 MG PO TABS: ORAL_TABLET | ORAL | 3 refills | Status: DC

## 2019-06-15 MED ORDER — FUROSEMIDE 20 MG PO TABS: 20.0000 mg | ORAL_TABLET | Freq: Every day | ORAL | 3 refills | Status: DC

## 2019-06-15 MED ORDER — ROSUVASTATIN CALCIUM 10 MG PO TABS: 10.0000 mg | ORAL_TABLET | Freq: Every day | ORAL | 3 refills | Status: DC

## 2019-06-15 MED ORDER — TRAZODONE HCL 100 MG PO TABS: 100.0000 mg | ORAL_TABLET | Freq: Every day | ORAL | 3 refills | Status: DC

## 2019-06-15 NOTE — Progress Notes (Signed)
Patient presents for 3 month follow up in [facility] today with wife. Reports no problems with VAD equipment or concerns with drive line.  Pt reports he and his wife have been following Covid restrictions and staying home. Pt discussed upcoming Covid vaccine with Dr. Haroldine Laws at length.  Pt has increased # of PI events on history; he is taking Lasix 20 mg alternating with 40 mg. He does confirm episodes of dizziness and lightheadedness, but feels this is chronic in nature. Dr. Haroldine Laws performed physical exam and asked patient to decrease Lasix to 20 mg daily, may take 40 mg as needed if pt has weight gain of > 2 lbs overnight. Wife and pt verbalized understanding of same.  Pt has jury duty summons and is asking for letter to excuse him due to Covid restrictions. Dr. Haroldine Laws agrees, letter will be sent.   Vital Signs:  Doppler Pressure: 100 Automatc BP:  101/79 (87) HR: 98 SPO2: 98%  Weight: 198.0 lb w/o eqt Last weight: 194 lbs  VAD Indication: Destination Therapy patient choice    VAD interrogation & Equipment Management: Speed:9200 Flow: 5.0 Power:  5.3 w    PI: 6.2  Alarms: few low voltage Events: 5 - 15 PI events daily  Fixed speed 9200 Low speed limit: 8600  Primary back-up battery expiring in 16 months Back up Controller: Expiring in 39mo  Annual Equipment Maintenance on UBC/PM was performed on 12/2018.   I reviewed the LVAD parameters from today and compared the results to the patient's prior recorded data. LVAD interrogation was NEGATIVE for significant power changes, NEGATIVE for clinical alarms and STABLE for PI events/speed drops. No programming changes were made and pump is functioning within specified parameters. Pt is performing daily controller and system monitor self tests along with completing weekly and monthly maintenance for LVAD equipment.  LVAD equipment check completed and is in good working order. Back-up equipment not present.   Exit Site  Care: Drive line is being maintained weekly  by his wife, [name]. Drive line exit site well healed and incorporated. The velour is fully implanted at exit site. Dressing dry and intact. Slight erythema, scant amount of brown drainage. Stabilization device present and accurately applied. Pt denies fever or chills. Given 15 anchors, 15 sorbaview dressings, 1 box of saline wipes, 1 box of adhesive remover, and silk tape.   Significant Events on VAD Support:  02/21/17> lap chole  Device:none   BP & Labs:  MAP 106 - Doppler is reflecting modified systolic  Hgb [id number] - No S/S of bleeding. Specifically denies melena/BRBPR or nosebleeds.  LDH pending;  established baseline of 250- 320. Pt denies tea-colored urine. No power elevations noted on interrogation. We will continue to check this with his INR.     Patient Instructions:  1. Decrease Lasix to 20 mg daily. 2. PharmD will call you with INR results and warfarin dosing. 3. Return to [facility] in 3 months with 5 year Intermacs and annual maintenance.  Zada Girt RN [place] Coordinator  Office: [phone]  24/7 Pager: [phone]

## 2019-06-15 NOTE — Patient Instructions (Addendum)
1. Decrease Lasix to 20 mg daily. 2. PharmD will call you with INR results and warfarin dosing. 3. Return to [facility] in 3 months with 5 year Intermacs and annual maintenance.

## 2019-06-15 NOTE — Progress Notes (Signed)
Patient ID: Scott [name], male   DOB: [birth year], 73 y.o.   MRN: [id number]     Primary Cardiologist: Dr Haroldine Laws INR : [facility] [place]  HPI: Scott [name] is a 73 y/o male with h/o CAD with ankylosing spondylitis, DM2, carotid stenting, severe ischemic CM s/p anterior STEM with VT arrest in 12/15, large PE with pulmonary infarct in 2/15 s/p IVC filter and systolic HF with EF 123456.  He underwent HM II LVAD placement on 09/20/13. He had a prolonged hospital course due to recurrent chest bleeding, severe CO2 retention and RV failure.  In 8/18 admitted with acute gallstone pancreatitis. Underwent lap chole 02/21/17.   Admitted 2/18 with elevated LDH. He was started on bivalirudin. LDH went down [phone].   Today he returns for 3 month VAD follow up. He is here with his wife. Continues to do very well.  Active. Denies CP or significant SOB. Taking lasix 20 daily alternating with 40 daily. No edema. Dizzy at times but thinks it is related to his previous MI. No falls. Weight stable 187-188. Denies orthopnea or PND. No fevers, chills or problems with driveline. No bleeding, melena or neuro symptoms. No VAD alarms. Taking all meds as prescribed.    VAD Indication: Destination Therapy patient choice    VAD interrogation & Equipment Management: Speed:9200 Flow: 5.0 Power:  5.3 w PI: 6.2  Alarms: few low voltage Events: 5 - 15 PI events daily  Fixed speed 9200 Low speed limit: 8600  Primary back-up battery expiring in 16 months Back up Controller: Expiring in 70mo  Annual Equipment Maintenance on UBC/PM was performed on 12/2018.  I reviewed the LVAD parameters from todayand compared the results to the patient's prior recorded data.LVAD interrogation was NEGATIVEfor significant power changes, NEGATIVEfor clinicalalarms and STABLEfor PI events/speed drops. No programming changes were madeand pump is functioning within specified parameters. Pt is performing daily controller  and system monitor self tests along with completing weekly and monthly maintenance for LVAD equipment.   SH:  Social History   Socioeconomic History  . Marital status: Married    Spouse name: Not on file  . Number of children: Not on file  . Years of education: Not on file  . Highest education level: Not on file  Occupational History  . Occupation: retired  Tobacco Use  . Smoking status: Former Smoker    Packs/day: 0.30    Years: 0.00    Pack years: 0.00    Types: Cigarettes    Quit date: 06/12/1969    Years since quitting: 50.0  . Smokeless tobacco: Never Used  . 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  Substance and Sexual Activity  . Alcohol use: No    Alcohol/week: 0.0 standard drinks  . Drug use: No  . Sexual activity: Not Currently  Other Topics Concern  . Not on file  Social History Narrative  . Not on file   Social Determinants of Health   Financial Resource Strain:   . Difficulty of Paying Living Expenses: Not on file  Food Insecurity:   . Worried About Charity fundraiser in the Last Year: Not on file  . Ran Out of Food in the Last Year: Not on file  Transportation Needs:   . Lack of Transportation (Medical): Not on file  . Lack of Transportation (Non-Medical): Not on file  Physical Activity:   . Days of Exercise per Week: Not on file  . Minutes of Exercise per Session: Not  on file  Stress:   . Feeling of Stress : Not on file  Social Connections:   . Frequency of Communication with Friends and Family: Not on file  . Frequency of Social Gatherings with Friends and Family: Not on file  . Attends Religious Services: Not on file  . Active Member of Clubs or Organizations: Not on file  . Attends Archivist Meetings: Not on file  . Marital Status: Not on file  Intimate Partner Violence:   . Fear of Current or Ex-Partner: Not on file  . Emotionally Abused: Not on file  . Physically Abused: Not on file  . Sexually  Abused: Not on file    FH:  Family History  Problem Relation Age of Onset  . Hypertension Father   . Heart attack Father   . Heart Problems Sister     Past Medical History:  Diagnosis Date  . Acute on chronic respiratory failure ([place])    a. 08/2014 in setting of PE.  Marland Kitchen Ankylosing spondylitis ([place])   . Bilateral pulmonary embolism ([place])    a. 08/2014 - started on Coumadin. Retrievable IVC filter placed 08/27/14 due to RV strain and large clot burden.  Marland Kitchen CAD (coronary artery disease)    a. stenting of LCx 2013; b. STEMI 06/12/14 s/p PCI to LAD complicated by post cath shock requiring IABP; VT s/p DCCV, EF 20%; c. NSTEMI 06/26/14 treated medically.  . Carotid artery disease ([place])    a. s/p stenting.  . Chronic systolic CHF (congestive heart failure) ([place])   . Diabetes ([place])   . Hemoptysis    a. 08/2014 possibly due to pulm infarct/PE.  Marland Kitchen Hyperlipidemia   . Hypertension   . Hypotension   . Ischemic cardiomyopathy    a. echo 08/23/2014 EF <20%, dilated CM, mod MR/TR  . Leukocytosis   . Leukocytosis 09/10/2014  . Nephrolithiasis   . Pleural effusion on right 08/2014 - small  . Reactive thrombocytosis 09/10/2014  . Right leg DVT ([place])    a. 08/2014.  Marland Kitchen Ventricular tachycardia ([place])    a. 06/2014 at time of MI, s/p DCCV.   Current Outpatient Medications  Medication Sig Dispense Refill  . acetaminophen (TYLENOL) 500 MG tablet Take 1,000 mg by mouth every 6 (six) hours as needed for moderate pain.     Marland Kitchen aspirin EC 81 MG tablet Take 1 tablet (81 mg total) by mouth daily. 90 tablet 3  . clopidogrel (PLAVIX) 75 MG tablet Take 1 tablet (75 mg total) by mouth daily. 90 tablet 3  . cyclobenzaprine (FLEXERIL) 5 MG tablet Take 1 tablet (5 mg total) by mouth 3 (three) times daily as needed for muscle spasms. 270 tablet 3  . pantoprazole (PROTONIX) 40 MG tablet Take 1 tablet (40 mg total) by mouth at bedtime. 90 tablet 3  . potassium chloride SA (K-DUR,KLOR-CON) 20 MEQ tablet Every other day  alternate 20 mEq (1 tablet) with 40 mEq (2 tablets) 135 tablet 3  . rosuvastatin (CRESTOR) 10 MG tablet Take 1 tablet (10 mg total) by mouth at bedtime. 90 tablet 3  . sildenafil (VIAGRA) 100 MG tablet Take 1 tablet (100 mg total) by mouth daily as needed for erectile dysfunction. 10 tablet 3  . traMADol (ULTRAM) 50 MG tablet Take 2 tablets (100 mg total) by mouth every 6 (six) hours as needed for moderate pain. (Patient taking differently: Take 50 mg by mouth every 6 (six) hours as needed for moderate pain. ) 120 tablet 3  .  traZODone (DESYREL) 100 MG tablet Take 1 tablet (100 mg total) by mouth at bedtime. 90 tablet 3  . warfarin (JANTOVEN) 6 MG tablet TAKE 1 TABLET DAILY EXCEPT TAKE 1 AND 1/2 TABLETS (9  MG) ON WEDNESDAY 100 tablet 3  . furosemide (LASIX) 20 MG tablet Take 1 tablet (20 mg total) by mouth daily. Take 20 mg daily; may take 40 mg if weight gain 120 tablet 3   No current facility-administered medications for this encounter.     Vital Signs:  Doppler Pressure: 100 Automatc BP:  101/79 (87) HR: 98 SPO2: 98%  Weight: 198.0 lb w/o eqt Last weight: 194 lbs Home weights: 187 - 188 lbs  Wt Readings from Last 3 Encounters:  06/15/19 89.8 kg (198 lb)  04/22/19 84.8 kg (187 lb)  04/15/19 84.8 kg (187 lb)   Physical Exam: General:  NAD.  HEENT: normal  Neck: supple. JVP not elevated.  Carotids 2+ bilat; no bruits. No lymphadenopathy or thryomegaly appreciated. Cor: LVAD hum.  Lungs: Clear. Abdomen:soft, nontender, non-distended. No hepatosplenomegaly. No bruits or masses. Good bowel sounds. Driveline site clean. Anchor in place.  Extremities: no cyanosis, clubbing, rash. Warm no edema  Neuro: alert & oriented x 3. No focal deficits. Moves all 4 without problem     ASSESSMENT & PLAN:  1. Chronic systolic HF: Ischemic cardiomyopathy, EF 20% s/p HMII LVAD 09/20/13.   - Doing very well over 5.5 years out.  - NYHA I-II with VAD support.   - Volume status looks good.   May be a bit on the dry side. Decrease lasix to 20 daily (if not working can use 40 qod) - Has refused transplant w/u in past and now age prohibitve  2. CAD s/p Anterior STEMI on 06/12/14 with stenting of LAD:  - No s/s ischemia - Continue statin. Off ASA due to bleeding.   3. VAD management:  - Had admit in 08/2017 for elevated LDH.  - LDH stabilized 300-380. No other signs/sx of pump thrombosis - VAD interrogated personally. Parameters stable. Occasional PI events. Change lasix as above - Off asa. On coumadin.  - Hgb 14.7 - INR 2.7  Discussed dosing with PharmD personally. - LDH 285   4. Ankylosing spondylitis: Off NSAIDS. -  Follows with rheumatology. Uses tramadol. Avoids NSAIDs as much as possible. -  Pain remains well controlled   5. HTN:  - Blood pressure well controlled. Continue current regimen.  6. Kidney Stones  - CT with renal calculi bilaterally. Asymptomatic. No intervention needed at this time.  Now followed by Dr Tresa Moore at [facility]. - Continue f/u  Total time spent 35 minutes. Over half that time spent discussing above.  Erlinda Hong, MD  06/15/2019

## 2019-06-15 NOTE — Progress Notes (Signed)
LVAD INR 

## 2019-06-24 ENCOUNTER — Other Ambulatory Visit (HOSPITAL_COMMUNITY): Payer: Self-pay | Admitting: Unknown Physician Specialty

## 2019-06-24 DIAGNOSIS — Z95811 Presence of heart assist device: Secondary | ICD-10-CM

## 2019-06-24 DIAGNOSIS — Z7901 Long term (current) use of anticoagulants: Secondary | ICD-10-CM

## 2019-07-01 ENCOUNTER — Ambulatory Visit (HOSPITAL_COMMUNITY): Payer: Self-pay | Admitting: Pharmacist

## 2019-07-01 ENCOUNTER — Ambulatory Visit (HOSPITAL_COMMUNITY)
Admission: RE | Admit: 2019-07-01 | Discharge: 2019-07-01 | Disposition: A | Discharge status: Home / Self Care | Payer: Medicare Other | Source: Ambulatory Visit | Attending: Cardiology | Admitting: Cardiology

## 2019-07-01 ENCOUNTER — Other Ambulatory Visit: Payer: Self-pay

## 2019-07-01 DIAGNOSIS — Z7901 Long term (current) use of anticoagulants: Secondary | ICD-10-CM | POA: Insufficient documentation

## 2019-07-01 DIAGNOSIS — Z5181 Encounter for therapeutic drug level monitoring: Secondary | ICD-10-CM

## 2019-07-01 DIAGNOSIS — Z95811 Presence of heart assist device: Secondary | ICD-10-CM | POA: Diagnosis present

## 2019-07-01 LAB — PROTIME-INR
INR: 2.8 — ABNORMAL HIGH (ref 0.8–1.2)
Prothrombin Time: 29 seconds — ABNORMAL HIGH (ref 11.4–15.2)

## 2019-07-01 LAB — LACTATE DEHYDROGENASE: LDH: 274 U/L — ABNORMAL HIGH (ref 98–192)

## 2019-07-01 NOTE — Progress Notes (Signed)
LVAD INR 

## 2019-07-09 ENCOUNTER — Other Ambulatory Visit (HOSPITAL_COMMUNITY): Payer: Self-pay | Admitting: *Deleted

## 2019-07-09 DIAGNOSIS — Z95811 Presence of heart assist device: Secondary | ICD-10-CM

## 2019-07-09 DIAGNOSIS — Z7901 Long term (current) use of anticoagulants: Secondary | ICD-10-CM

## 2019-07-15 ENCOUNTER — Other Ambulatory Visit: Payer: Self-pay

## 2019-07-15 ENCOUNTER — Ambulatory Visit (HOSPITAL_COMMUNITY): Payer: Self-pay | Admitting: Pharmacist

## 2019-07-15 ENCOUNTER — Ambulatory Visit (HOSPITAL_COMMUNITY)
Admission: RE | Admit: 2019-07-15 | Discharge: 2019-07-15 | Disposition: A | Discharge status: Home / Self Care | Payer: Medicare Other | Source: Ambulatory Visit | Attending: Internal Medicine | Admitting: Internal Medicine

## 2019-07-15 DIAGNOSIS — Z95811 Presence of heart assist device: Secondary | ICD-10-CM | POA: Diagnosis not present

## 2019-07-15 DIAGNOSIS — Z7901 Long term (current) use of anticoagulants: Secondary | ICD-10-CM | POA: Insufficient documentation

## 2019-07-15 DIAGNOSIS — Z5181 Encounter for therapeutic drug level monitoring: Secondary | ICD-10-CM

## 2019-07-15 LAB — LACTATE DEHYDROGENASE: LDH: 296 U/L — ABNORMAL HIGH (ref 98–192)

## 2019-07-15 LAB — PROTIME-INR
INR: 2.9 — ABNORMAL HIGH (ref 0.8–1.2)
Prothrombin Time: 30.1 seconds — ABNORMAL HIGH (ref 11.4–15.2)

## 2019-07-15 NOTE — Progress Notes (Signed)
LVAD INR 

## 2019-07-16 ENCOUNTER — Telehealth (HOSPITAL_COMMUNITY): Payer: Self-pay | Admitting: Licensed Clinical Social Worker

## 2019-07-16 NOTE — Telephone Encounter (Signed)
CSW contacted patient/caregiver to assure food, medications and confirmation of VAD pager number if concerns or emergency arise. CSW unable to leave message.  CSW continues to follow as needed. Raquel Sarna, [place], [place]

## 2019-07-24 ENCOUNTER — Other Ambulatory Visit (HOSPITAL_COMMUNITY): Payer: Self-pay | Admitting: *Deleted

## 2019-07-24 DIAGNOSIS — Z7901 Long term (current) use of anticoagulants: Secondary | ICD-10-CM

## 2019-07-24 DIAGNOSIS — Z95811 Presence of heart assist device: Secondary | ICD-10-CM

## 2019-07-29 ENCOUNTER — Ambulatory Visit (HOSPITAL_COMMUNITY): Payer: Self-pay | Admitting: Pharmacist

## 2019-07-29 ENCOUNTER — Other Ambulatory Visit: Payer: Self-pay

## 2019-07-29 ENCOUNTER — Ambulatory Visit (HOSPITAL_COMMUNITY)
Admission: RE | Admit: 2019-07-29 | Discharge: 2019-07-29 | Disposition: A | Discharge status: Home / Self Care | Payer: Medicare Other | Source: Ambulatory Visit | Attending: Internal Medicine | Admitting: Internal Medicine

## 2019-07-29 DIAGNOSIS — Z95811 Presence of heart assist device: Secondary | ICD-10-CM | POA: Diagnosis not present

## 2019-07-29 DIAGNOSIS — Z7901 Long term (current) use of anticoagulants: Secondary | ICD-10-CM | POA: Insufficient documentation

## 2019-07-29 DIAGNOSIS — Z5181 Encounter for therapeutic drug level monitoring: Secondary | ICD-10-CM

## 2019-07-29 LAB — LACTATE DEHYDROGENASE: LDH: 278 U/L — ABNORMAL HIGH (ref 98–192)

## 2019-07-29 LAB — PROTIME-INR
INR: 2.6 — ABNORMAL HIGH (ref 0.8–1.2)
Prothrombin Time: 28 seconds — ABNORMAL HIGH (ref 11.4–15.2)

## 2019-07-29 NOTE — Progress Notes (Signed)
LVAD INR 

## 2019-08-07 ENCOUNTER — Other Ambulatory Visit (HOSPITAL_COMMUNITY): Payer: Self-pay | Admitting: Unknown Physician Specialty

## 2019-08-07 DIAGNOSIS — Z95811 Presence of heart assist device: Secondary | ICD-10-CM

## 2019-08-07 DIAGNOSIS — Z7901 Long term (current) use of anticoagulants: Secondary | ICD-10-CM

## 2019-08-12 ENCOUNTER — Ambulatory Visit (HOSPITAL_COMMUNITY): Payer: Self-pay | Admitting: Pharmacist

## 2019-08-12 ENCOUNTER — Ambulatory Visit (HOSPITAL_COMMUNITY)
Admission: RE | Admit: 2019-08-12 | Discharge: 2019-08-12 | Disposition: A | Discharge status: Home / Self Care | Payer: Medicare Other | Source: Ambulatory Visit | Attending: Internal Medicine | Admitting: Internal Medicine

## 2019-08-12 ENCOUNTER — Other Ambulatory Visit: Payer: Self-pay

## 2019-08-12 DIAGNOSIS — Z95811 Presence of heart assist device: Secondary | ICD-10-CM | POA: Diagnosis not present

## 2019-08-12 DIAGNOSIS — Z5181 Encounter for therapeutic drug level monitoring: Secondary | ICD-10-CM

## 2019-08-12 DIAGNOSIS — Z7901 Long term (current) use of anticoagulants: Secondary | ICD-10-CM | POA: Insufficient documentation

## 2019-08-12 LAB — PROTIME-INR
INR: 2.7 — ABNORMAL HIGH (ref 0.8–1.2)
Prothrombin Time: 29 seconds — ABNORMAL HIGH (ref 11.4–15.2)

## 2019-08-12 LAB — LACTATE DEHYDROGENASE: LDH: 259 U/L — ABNORMAL HIGH (ref 98–192)

## 2019-08-12 NOTE — Progress Notes (Signed)
LVAD INR 

## 2019-08-21 ENCOUNTER — Other Ambulatory Visit (HOSPITAL_COMMUNITY): Payer: Self-pay | Admitting: *Deleted

## 2019-08-21 DIAGNOSIS — Z95811 Presence of heart assist device: Secondary | ICD-10-CM

## 2019-08-21 DIAGNOSIS — Z7901 Long term (current) use of anticoagulants: Secondary | ICD-10-CM

## 2019-08-26 ENCOUNTER — Ambulatory Visit (HOSPITAL_COMMUNITY)
Admission: RE | Admit: 2019-08-26 | Discharge: 2019-08-26 | Disposition: A | Discharge status: Home / Self Care | Payer: Medicare Other | Source: Ambulatory Visit | Attending: Cardiology | Admitting: Cardiology

## 2019-08-26 ENCOUNTER — Other Ambulatory Visit: Payer: Self-pay

## 2019-08-26 ENCOUNTER — Ambulatory Visit (HOSPITAL_COMMUNITY): Payer: Self-pay | Admitting: Pharmacist

## 2019-08-26 DIAGNOSIS — Z5181 Encounter for therapeutic drug level monitoring: Secondary | ICD-10-CM | POA: Insufficient documentation

## 2019-08-26 DIAGNOSIS — Z95811 Presence of heart assist device: Secondary | ICD-10-CM

## 2019-08-26 DIAGNOSIS — Z7901 Long term (current) use of anticoagulants: Secondary | ICD-10-CM | POA: Diagnosis not present

## 2019-08-26 LAB — PROTIME-INR
INR: 2.9 — ABNORMAL HIGH (ref 0.8–1.2)
Prothrombin Time: 30.6 seconds — ABNORMAL HIGH (ref 11.4–15.2)

## 2019-08-26 LAB — LACTATE DEHYDROGENASE: LDH: 258 U/L — ABNORMAL HIGH (ref 98–192)

## 2019-08-26 NOTE — Progress Notes (Signed)
LVAD INR 

## 2019-09-11 ENCOUNTER — Other Ambulatory Visit: Payer: Self-pay | Admitting: *Deleted

## 2019-09-11 ENCOUNTER — Other Ambulatory Visit (HOSPITAL_COMMUNITY): Payer: Self-pay | Admitting: *Deleted

## 2019-09-11 DIAGNOSIS — Z7901 Long term (current) use of anticoagulants: Secondary | ICD-10-CM

## 2019-09-11 DIAGNOSIS — Z95811 Presence of heart assist device: Secondary | ICD-10-CM

## 2019-09-14 ENCOUNTER — Encounter (HOSPITAL_COMMUNITY): Payer: Self-pay

## 2019-09-14 ENCOUNTER — Other Ambulatory Visit: Payer: Self-pay

## 2019-09-14 ENCOUNTER — Ambulatory Visit (HOSPITAL_COMMUNITY): Payer: Self-pay | Admitting: Pharmacist

## 2019-09-14 ENCOUNTER — Ambulatory Visit (HOSPITAL_COMMUNITY)
Admission: RE | Admit: 2019-09-14 | Discharge: 2019-09-14 | Disposition: A | Discharge status: Home / Self Care | Payer: Medicare Other | Source: Ambulatory Visit | Attending: Internal Medicine | Admitting: Internal Medicine

## 2019-09-14 VITALS — BP 115/74 | HR 98 | Temp 97.9°F | Ht 68.0 in | Wt 196.2 lb

## 2019-09-14 DIAGNOSIS — T827XXA Infection and inflammatory reaction due to other cardiac and vascular devices, implants and grafts, initial encounter: Secondary | ICD-10-CM

## 2019-09-14 DIAGNOSIS — E119 Type 2 diabetes mellitus without complications: Secondary | ICD-10-CM | POA: Diagnosis not present

## 2019-09-14 DIAGNOSIS — I5023 Acute on chronic systolic (congestive) heart failure: Secondary | ICD-10-CM | POA: Diagnosis not present

## 2019-09-14 DIAGNOSIS — Z86718 Personal history of other venous thrombosis and embolism: Secondary | ICD-10-CM | POA: Insufficient documentation

## 2019-09-14 DIAGNOSIS — Z86711 Personal history of pulmonary embolism: Secondary | ICD-10-CM | POA: Diagnosis not present

## 2019-09-14 DIAGNOSIS — I5022 Chronic systolic (congestive) heart failure: Secondary | ICD-10-CM | POA: Diagnosis not present

## 2019-09-14 DIAGNOSIS — I5041 Acute combined systolic (congestive) and diastolic (congestive) heart failure: Secondary | ICD-10-CM | POA: Diagnosis not present

## 2019-09-14 DIAGNOSIS — G479 Sleep disorder, unspecified: Secondary | ICD-10-CM | POA: Diagnosis not present

## 2019-09-14 DIAGNOSIS — Z955 Presence of coronary angioplasty implant and graft: Secondary | ICD-10-CM | POA: Diagnosis not present

## 2019-09-14 DIAGNOSIS — Z87891 Personal history of nicotine dependence: Secondary | ICD-10-CM | POA: Insufficient documentation

## 2019-09-14 DIAGNOSIS — I255 Ischemic cardiomyopathy: Secondary | ICD-10-CM | POA: Insufficient documentation

## 2019-09-14 DIAGNOSIS — Z7901 Long term (current) use of anticoagulants: Secondary | ICD-10-CM | POA: Insufficient documentation

## 2019-09-14 DIAGNOSIS — Z95811 Presence of heart assist device: Secondary | ICD-10-CM | POA: Insufficient documentation

## 2019-09-14 DIAGNOSIS — Z79899 Other long term (current) drug therapy: Secondary | ICD-10-CM | POA: Diagnosis not present

## 2019-09-14 DIAGNOSIS — E785 Hyperlipidemia, unspecified: Secondary | ICD-10-CM | POA: Diagnosis not present

## 2019-09-14 DIAGNOSIS — Z8249 Family history of ischemic heart disease and other diseases of the circulatory system: Secondary | ICD-10-CM | POA: Diagnosis not present

## 2019-09-14 DIAGNOSIS — I252 Old myocardial infarction: Secondary | ICD-10-CM | POA: Insufficient documentation

## 2019-09-14 DIAGNOSIS — M459 Ankylosing spondylitis of unspecified sites in spine: Secondary | ICD-10-CM | POA: Diagnosis not present

## 2019-09-14 DIAGNOSIS — I110 Hypertensive heart disease with heart failure: Secondary | ICD-10-CM | POA: Insufficient documentation

## 2019-09-14 DIAGNOSIS — I2510 Atherosclerotic heart disease of native coronary artery without angina pectoris: Secondary | ICD-10-CM | POA: Insufficient documentation

## 2019-09-14 DIAGNOSIS — Z7902 Long term (current) use of antithrombotics/antiplatelets: Secondary | ICD-10-CM | POA: Insufficient documentation

## 2019-09-14 DIAGNOSIS — Z8674 Personal history of sudden cardiac arrest: Secondary | ICD-10-CM | POA: Insufficient documentation

## 2019-09-14 DIAGNOSIS — Z7982 Long term (current) use of aspirin: Secondary | ICD-10-CM | POA: Diagnosis not present

## 2019-09-14 DIAGNOSIS — R0600 Dyspnea, unspecified: Secondary | ICD-10-CM

## 2019-09-14 DIAGNOSIS — Z5181 Encounter for therapeutic drug level monitoring: Secondary | ICD-10-CM

## 2019-09-14 DIAGNOSIS — N200 Calculus of kidney: Secondary | ICD-10-CM | POA: Diagnosis not present

## 2019-09-14 LAB — CBC
HCT: 45.8 % (ref 39.0–52.0)
Hemoglobin: 14.2 g/dL (ref 13.0–17.0)
MCH: 29.2 pg (ref 26.0–34.0)
MCHC: 31 g/dL (ref 30.0–36.0)
MCV: 94.2 fL (ref 80.0–100.0)
Platelets: 345 10*3/uL (ref 150–400)
RBC: 4.86 MIL/uL (ref 4.22–5.81)
RDW: 15.6 % — ABNORMAL HIGH (ref 11.5–15.5)
WBC: 8.2 10*3/uL (ref 4.0–10.5)
nRBC: 0 % (ref 0.0–0.2)

## 2019-09-14 LAB — COMPREHENSIVE METABOLIC PANEL
ALT: 20 U/L (ref 0–44)
AST: 24 U/L (ref 15–41)
Albumin: 3.9 g/dL (ref 3.5–5.0)
Alkaline Phosphatase: 113 U/L (ref 38–126)
Anion gap: 10 (ref 5–15)
BUN: 15 mg/dL (ref 8–23)
CO2: 25 mmol/L (ref 22–32)
Calcium: 9.3 mg/dL (ref 8.9–10.3)
Chloride: 104 mmol/L (ref 98–111)
Creatinine, Ser: 1.11 mg/dL (ref 0.61–1.24)
GFR calc Af Amer: >60 mL/min (ref >60)
GFR calc non Af Amer: >60 mL/min (ref >60)
Glucose, Bld: 101 mg/dL — ABNORMAL HIGH (ref 70–99)
Potassium: 4.4 mmol/L (ref 3.5–5.1)
Sodium: 139 mmol/L (ref 135–145)
Total Bilirubin: 1 mg/dL (ref 0.3–1.2)
Total Protein: 8.3 g/dL — ABNORMAL HIGH (ref 6.5–8.1)

## 2019-09-14 LAB — IRON AND TIBC
Iron: 42 ug/dL — ABNORMAL LOW (ref 45–182)
Saturation Ratios: 8 % — ABNORMAL LOW (ref 17.9–39.5)
TIBC: 504 ug/dL — ABNORMAL HIGH (ref 250–450)
UIBC: 462 ug/dL

## 2019-09-14 LAB — PROTIME-INR
INR: 2.4 — ABNORMAL HIGH (ref 0.8–1.2)
Prothrombin Time: 25.9 seconds — ABNORMAL HIGH (ref 11.4–15.2)

## 2019-09-14 LAB — VITAMIN B12: Vitamin B-12: 422 pg/mL (ref 180–914)

## 2019-09-14 LAB — LACTATE DEHYDROGENASE: LDH: 235 U/L — ABNORMAL HIGH (ref 98–192)

## 2019-09-14 LAB — FERRITIN: Ferritin: 22 ng/mL — ABNORMAL LOW (ref 24–336)

## 2019-09-14 LAB — PREALBUMIN: Prealbumin: 18.5 mg/dL (ref 18–38)

## 2019-09-14 LAB — FOLATE: Folate: 13.5 ng/mL (ref >5.9)

## 2019-09-14 MED ORDER — DOXYCYCLINE HYCLATE 50 MG PO CAPS: 100.0000 mg | ORAL_CAPSULE | Freq: Two times a day (BID) | ORAL | 0 refills | Status: DC

## 2019-09-14 MED ORDER — CLOPIDOGREL BISULFATE 75 MG PO TABS: 75.0000 mg | ORAL_TABLET | Freq: Every day | ORAL | 3 refills | Status: DC

## 2019-09-14 NOTE — Progress Notes (Signed)
Patient ID: Scott [name], male   DOB: 21-May-1946, 74 y.o.   MRN: [id number]     Primary Cardiologist: Dr Haroldine Laws INR : [facility] [place]  HPI: Scott [name] is a 74 y/o male with h/o CAD with ankylosing spondylitis, DM2, carotid stenting, severe ischemic CM s/p anterior STEM with VT arrest in 12/15, large PE with pulmonary infarct in 2/15 s/p IVC filter and systolic HF with EF 123456.  He underwent HM II LVAD placement on 09/20/13. He had a prolonged hospital course due to recurrent chest bleeding, severe CO2 retention and RV failure.  In 8/18 admitted with acute gallstone pancreatitis. Underwent lap chole 02/21/17.   Admitted 2/18 with elevated LDH. He was started on bivalirudin. LDH went down [phone].   Today he returns for VAD follow up. He is here with his wife. Continues to do very well. Able to do all activities without too much difficulty. Over the weekend had some driveline trauma when he was changing the oil on one of his cars. Sites has mild bleeding. No purulence. Weight stable. Denies orthopnea or PND. No fevers, chills or problems with driveline. No bleeding, melena or neuro symptoms. No VAD alarms. Taking all meds as prescribed.   VAD Indication: Destination Therapy patient choice    VAD interrogation & Equipment Management: Speed:9200 Flow: 4.4 Power:  5.2 w PI: 6.8  Alarms: few low voltage - "fell asleep on batteries" Events: 0 - 5 PI events daily  Fixed speed 9200 Low speed limit: 8600  Primary controller back-up battery expiring in 13 months Back up controller back-up battery expiring in 13 months  Annual Equipment Maintenance on UBC/PM was performed on 12/2018.  I reviewed the LVAD parameters from todayand compared the results to the patient's prior recorded data.LVAD interrogation was NEGATIVEfor significant power changes, NEGATIVEfor clinicalalarms and STABLEfor PI events/speed drops. No programming changes were madeand pump is functioning within  specified parameters. Pt is performing daily controller and system monitor self tests along with completing weekly and monthly maintenance for LVAD equipment.   SH:  Social History   Socioeconomic History  . Marital status: Married    Spouse name: Not on file  . Number of children: Not on file  . Years of education: Not on file  . Highest education level: Not on file  Occupational History  . Occupation: retired  Tobacco Use  . Smoking status: Former Smoker    Packs/day: 0.30    Years: 0.00    Pack years: 0.00    Types: Cigarettes    Quit date: 06/12/1969    Years since quitting: 50.2  . Smokeless tobacco: Never Used  . 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  Substance and Sexual Activity  . Alcohol use: No    Alcohol/week: 0.0 standard drinks  . Drug use: No  . Sexual activity: Not Currently  Other Topics Concern  . Not on file  Social History Narrative  . Not on file   Social Determinants of Health   Financial Resource Strain:   . Difficulty of Paying Living Expenses:   Food Insecurity:   . Worried About Charity fundraiser in the Last Year:   . Arboriculturist in the Last Year:   Transportation Needs:   . Film/video editor (Medical):   Marland Kitchen Lack of Transportation (Non-Medical):   Physical Activity:   . Days of Exercise per Week:   . Minutes of Exercise per Session:   Stress:   .  Feeling of Stress :   Social Connections:   . Frequency of Communication with Friends and Family:   . Frequency of Social Gatherings with Friends and Family:   . Attends Religious Services:   . Active Member of Clubs or Organizations:   . Attends Archivist Meetings:   Marland Kitchen Marital Status:   Intimate Partner Violence:   . Fear of Current or Ex-Partner:   . Emotionally Abused:   Marland Kitchen Physically Abused:   . Sexually Abused:     FH:  Family History  Problem Relation Age of Onset  . Hypertension Father   . Heart attack Father   . Heart Problems  Sister     Past Medical History:  Diagnosis Date  . Acute on chronic respiratory failure ([place])    a. 08/2014 in setting of PE.  Marland Kitchen Ankylosing spondylitis ([place])   . Bilateral pulmonary embolism ([place])    a. 08/2014 - started on Coumadin. Retrievable IVC filter placed 08/27/14 due to RV strain and large clot burden.  Marland Kitchen CAD (coronary artery disease)    a. stenting of LCx 2013; b. STEMI 06/12/14 s/p PCI to LAD complicated by post cath shock requiring IABP; VT s/p DCCV, EF 20%; c. NSTEMI 06/26/14 treated medically.  . Carotid artery disease ([place])    a. s/p stenting.  . Chronic systolic CHF (congestive heart failure) ([place])   . Diabetes ([place])   . Hemoptysis    a. 08/2014 possibly due to pulm infarct/PE.  Marland Kitchen Hyperlipidemia   . Hypertension   . Hypotension   . Ischemic cardiomyopathy    a. echo 08/23/2014 EF <20%, dilated CM, mod MR/TR  . Leukocytosis   . Leukocytosis 09/10/2014  . Nephrolithiasis   . Pleural effusion on right 08/2014 - small  . Reactive thrombocytosis 09/10/2014  . Right leg DVT ([place])    a. 08/2014.  Marland Kitchen Ventricular tachycardia ([place])    a. 06/2014 at time of MI, s/p DCCV.   Current Outpatient Medications  Medication Sig Dispense Refill  . acetaminophen (TYLENOL) 500 MG tablet Take 1,000 mg by mouth every 6 (six) hours as needed for moderate pain.     Marland Kitchen aspirin EC 81 MG tablet Take 1 tablet (81 mg total) by mouth daily. 90 tablet 3  . clopidogrel (PLAVIX) 75 MG tablet Take 1 tablet (75 mg total) by mouth daily. 90 tablet 3  . cyclobenzaprine (FLEXERIL) 5 MG tablet Take 1 tablet (5 mg total) by mouth 3 (three) times daily as needed for muscle spasms. 270 tablet 3  . furosemide (LASIX) 20 MG tablet Take 1 tablet (20 mg total) by mouth daily. Take 20 mg daily; may take 40 mg if weight gain 120 tablet 3  . pantoprazole (PROTONIX) 40 MG tablet Take 1 tablet (40 mg total) by mouth at bedtime. 90 tablet 3  . potassium chloride SA (K-DUR,KLOR-CON) 20 MEQ tablet Every other day alternate 20  mEq (1 tablet) with 40 mEq (2 tablets) 135 tablet 3  . rosuvastatin (CRESTOR) 10 MG tablet Take 1 tablet (10 mg total) by mouth at bedtime. 90 tablet 3  . sildenafil (VIAGRA) 100 MG tablet Take 1 tablet (100 mg total) by mouth daily as needed for erectile dysfunction. 10 tablet 3  . traMADol (ULTRAM) 50 MG tablet Take 2 tablets (100 mg total) by mouth every 6 (six) hours as needed for moderate pain. (Patient taking differently: Take 50 mg by mouth every 6 (six) hours as needed for moderate pain. ) 120 tablet 3  .  traZODone (DESYREL) 100 MG tablet Take 1 tablet (100 mg total) by mouth at bedtime. 90 tablet 3  . warfarin (JANTOVEN) 6 MG tablet TAKE 1 TABLET DAILY EXCEPT TAKE 1 AND 1/2 TABLETS (9  MG) ON WEDNESDAY 100 tablet 3  . doxycycline (VIBRAMYCIN) 50 MG capsule Take 2 capsules (100 mg total) by mouth 2 (two) times daily. 28 capsule 0   No current facility-administered medications for this encounter.   Vital Signs:  Temp: 97.9 Doppler Pressure: 100 Automatc BP:  115/74 (90) HR: 98 SPO2: 96% RA  Weight: 196.2 lb w/o eqt Last weight: 198 lbs  Wt Readings from Last 3 Encounters:  09/14/19 89 kg (196 lb 3.2 oz)  06/15/19 89.8 kg (198 lb)  04/22/19 84.8 kg (187 lb)   Physical Exam: General:  NAD.  HEENT: normal  Neck: supple. JVP not elevated.  Carotids 2+ bilat; no bruits. No lymphadenopathy or thryomegaly appreciated. Cor: LVAD hum.  Lungs: Clear. Abdomen: obese soft, nontender, non-distended. No hepatosplenomegaly. No bruits or masses. Good bowel sounds. Driveline site with minimal bloody drainage. No erythema or purulence Anchor in place.  Extremities: no cyanosis, clubbing, rash. Warm no edema  Neuro: alert & oriented x 3. No focal deficits. Moves all 4 without problem    ASSESSMENT & PLAN:  1. Chronic systolic HF: Ischemic cardiomyopathy, EF 20% s/p HMII LVAD 09/20/13.   - Doing very well. Now 6 years out.  - NYHA I-II with VAD support  - Volume status looks good.  -  Refused transplant w/u  2. CAD s/p Anterior STEMI on 06/12/14 with stenting of LAD:  - No s/s ischemia  - Continue statin. Off ASA due to bleeding.   3. VAD management:  - Had admit in 08/2017 for elevated LDH.  - LDH stabilized 300-380. No other signs/sx of pump thrombosis - VAD interrogated personally. Parameters stable. - Off asa. On coumadin.  - Hgb 14.2 - INR 2.4. Discussed dosing with PharmD personally. - LDH 235 - DL trauma. Start prophylactic doxy 100 bid x 7 days. Follow closely. Notify use with fever, chills, purulent discharge or pain.    4. Ankylosing spondylitis: Off NSAIDS. -  Follows with rheumatology. Uses tramadol. Avoids NSAIDs as much as possible. -  Pain reasonable well controlled   5. HTN:  - MAPs ok  6. Kidney Stones  - CT with renal calculi bilaterally. Asymptomatic. No intervention needed at this time.  Now followed by Dr Tresa Moore at [facility]. - No change  Total time spent 35 minutes. Over half that time spent discussing above.    Scott Bickers, MD  09/14/2019

## 2019-09-14 NOTE — Progress Notes (Signed)
Patient presents for 3 month follow up in [facility] today with wife. Reports no problems with VAD equipment or concerns with drive line.  Wife reports patient experienced drive line trauma on 07/14/19 with improvement with daily dressing changes. He again had drive line trauma on 09/03/19 with large amount bloody drainage. She has been using daily dressing kits to keep site clean and dry. Dr. Haroldine Laws assessed; will start Doxycycline 100 mg twice daily for 7 days. Audry Riles, PharmD updated.  Vital Signs:  Temp: 97.9 Doppler Pressure: 100 Automatc BP:  115/74 (90) HR: 98 SPO2: 96% RA  Weight: 196.2 lb w/o eqt Last weight: 198 lbs  VAD Indication: Destination Therapy patient choice    VAD interrogation & Equipment Management: Speed:9200 Flow: 4.4 Power:  5.2 w    PI: 6.8  Alarms: few low voltage - "fell asleep on batteries" Events: 0 - 5 PI events daily  Fixed speed 9200 Low speed limit: 8600  Primary controller back-up battery expiring in 13 months Back up controller back-up battery expiring in 13 months  Annual Equipment Maintenance on UBC/PM was performed on 12/2018.   I reviewed the LVAD parameters from today and compared the results to the patient's prior recorded data. LVAD interrogation was NEGATIVE for significant power changes, NEGATIVE for clinical alarms and STABLE for PI events/speed drops. No programming changes were made and pump is functioning within specified parameters. Pt is performing daily controller and system monitor self tests along with completing weekly and monthly maintenance for LVAD equipment.  LVAD equipment check completed and is in good working order. Back-up equipment not present.   Exit Site Care: Drive line is being maintained weekly  by his wife, [name]. Drive line exit site well healed and incorporated. The velour is fully implanted at exit site. Dressing dry and intact. Small amount bloody drainage noted; Stabilization device present  and accurately applied. Pt denies fever or chills. Given 20 anchors, 20 sorbaview dressings, 1 box of adhesive remover, 7 daily dressing kits.    Significant Events on VAD Support:  02/21/17> lap chole  Device:none   BP & Labs:  MAP 100 - Doppler is reflecting MAP  Hgb 14.2 - No S/S of bleeding. Specifically denies melena/BRBPR or nosebleeds.  LDH 235 and is within established baseline of 250- 320. Pt denies tea-colored urine. No power elevations noted on interrogation. We will continue to check this with his INR.   5 year Intermacs follow up completed including:  Quality of Life, KCCQ-12; pt refusedNeurocognitive trail making and 6 minute walk.    Back up controller: 11V backup battery charged at home prior to this visit.    Patient Instructions:  1. Start Doxy 100 mg twice daily for 14 days.  2. Return for labs in two weeks. PharmD will call you today with INR results and warfarin dosing.  3. Return to [facility] in 3 months and bring home equipment for annual maintenance.  Zada Girt RN [place] Coordinator  Office: [phone]  24/7 Pager: [phone]

## 2019-09-14 NOTE — Progress Notes (Signed)
LVAD INR 

## 2019-09-21 ENCOUNTER — Telehealth (HOSPITAL_COMMUNITY): Payer: Self-pay | Admitting: *Deleted

## 2019-09-21 ENCOUNTER — Other Ambulatory Visit (HOSPITAL_COMMUNITY): Payer: Self-pay | Admitting: *Deleted

## 2019-09-21 DIAGNOSIS — E611 Iron deficiency: Secondary | ICD-10-CM

## 2019-09-21 NOTE — Telephone Encounter (Signed)
Called patient and wife per Audry Riles, PharmD and informed them he will need one dose of IV iron.  Both say he has had before and understands the process.  IV iron infusion scheduled:  Wednesday, September 30, 2019 at 10:00 am - pt will report to Admissions prior to appt time  Pt has VAD Clinic labs INR and LDH scheduled same day at 9:30.  Pt and wife verbalized understanding of above.  Zada Girt RN, [place] Coordinator [phone]

## 2019-09-22 DIAGNOSIS — L409 Psoriasis, unspecified: Secondary | ICD-10-CM | POA: Diagnosis not present

## 2019-09-22 DIAGNOSIS — L821 Other seborrheic keratosis: Secondary | ICD-10-CM | POA: Diagnosis not present

## 2019-09-22 DIAGNOSIS — D2230 Melanocytic nevi of unspecified part of face: Secondary | ICD-10-CM | POA: Diagnosis not present

## 2019-09-22 DIAGNOSIS — Z23 Encounter for immunization: Secondary | ICD-10-CM | POA: Diagnosis not present

## 2019-09-25 ENCOUNTER — Other Ambulatory Visit (HOSPITAL_COMMUNITY): Payer: Self-pay | Admitting: Unknown Physician Specialty

## 2019-09-25 DIAGNOSIS — Z95811 Presence of heart assist device: Secondary | ICD-10-CM

## 2019-09-25 DIAGNOSIS — Z7901 Long term (current) use of anticoagulants: Secondary | ICD-10-CM

## 2019-09-30 ENCOUNTER — Ambulatory Visit (HOSPITAL_COMMUNITY)
Admission: RE | Admit: 2019-09-30 | Discharge: 2019-09-30 | Disposition: A | Discharge status: Home / Self Care | Payer: Medicare Other | Source: Ambulatory Visit | Attending: Internal Medicine | Admitting: Internal Medicine

## 2019-09-30 ENCOUNTER — Other Ambulatory Visit: Payer: Self-pay

## 2019-09-30 ENCOUNTER — Ambulatory Visit (HOSPITAL_COMMUNITY): Payer: Self-pay | Admitting: Pharmacist

## 2019-09-30 ENCOUNTER — Ambulatory Visit (HOSPITAL_COMMUNITY)
Admission: RE | Admit: 2019-09-30 | Discharge: 2019-09-30 | Disposition: A | Discharge status: Home / Self Care | Payer: Medicare Other | Source: Ambulatory Visit | Attending: Cardiology | Admitting: Cardiology

## 2019-09-30 DIAGNOSIS — Z95811 Presence of heart assist device: Secondary | ICD-10-CM | POA: Insufficient documentation

## 2019-09-30 DIAGNOSIS — Z5181 Encounter for therapeutic drug level monitoring: Secondary | ICD-10-CM

## 2019-09-30 DIAGNOSIS — Z7901 Long term (current) use of anticoagulants: Secondary | ICD-10-CM | POA: Diagnosis not present

## 2019-09-30 DIAGNOSIS — E611 Iron deficiency: Secondary | ICD-10-CM

## 2019-09-30 LAB — PROTIME-INR
INR: 2.8 — ABNORMAL HIGH (ref 0.8–1.2)
Prothrombin Time: 29.4 seconds — ABNORMAL HIGH (ref 11.4–15.2)

## 2019-09-30 LAB — LACTATE DEHYDROGENASE: LDH: 274 U/L — ABNORMAL HIGH (ref 98–192)

## 2019-09-30 MED ORDER — SODIUM CHLORIDE 0.9 % IV SOLN
510.0000 mg | Freq: Once | INTRAVENOUS | Status: AC
  Administered 2019-09-30: 510 mg via INTRAVENOUS
  Filled 2019-09-30: qty 17

## 2019-09-30 NOTE — Progress Notes (Signed)
LVAD INR 

## 2019-10-09 ENCOUNTER — Other Ambulatory Visit (HOSPITAL_COMMUNITY): Payer: Self-pay | Admitting: *Deleted

## 2019-10-09 DIAGNOSIS — Z7901 Long term (current) use of anticoagulants: Secondary | ICD-10-CM

## 2019-10-09 DIAGNOSIS — Z95811 Presence of heart assist device: Secondary | ICD-10-CM

## 2019-10-14 ENCOUNTER — Ambulatory Visit (HOSPITAL_COMMUNITY)
Admission: RE | Admit: 2019-10-14 | Discharge: 2019-10-14 | Disposition: A | Discharge status: Home / Self Care | Payer: Medicare Other | Source: Ambulatory Visit | Attending: Cardiology | Admitting: Cardiology

## 2019-10-14 ENCOUNTER — Ambulatory Visit (HOSPITAL_COMMUNITY): Payer: Self-pay | Admitting: Pharmacist

## 2019-10-14 ENCOUNTER — Other Ambulatory Visit: Payer: Self-pay

## 2019-10-14 DIAGNOSIS — Z5181 Encounter for therapeutic drug level monitoring: Secondary | ICD-10-CM

## 2019-10-14 DIAGNOSIS — Z95811 Presence of heart assist device: Secondary | ICD-10-CM | POA: Diagnosis not present

## 2019-10-14 DIAGNOSIS — Z7901 Long term (current) use of anticoagulants: Secondary | ICD-10-CM | POA: Diagnosis not present

## 2019-10-14 LAB — PROTIME-INR
INR: 2.7 — ABNORMAL HIGH (ref 0.8–1.2)
Prothrombin Time: 28.4 seconds — ABNORMAL HIGH (ref 11.4–15.2)

## 2019-10-14 LAB — LACTATE DEHYDROGENASE: LDH: 293 U/L — ABNORMAL HIGH (ref 98–192)

## 2019-10-14 NOTE — Progress Notes (Signed)
LVAD INR 

## 2019-10-23 ENCOUNTER — Other Ambulatory Visit (HOSPITAL_COMMUNITY): Payer: Self-pay | Admitting: *Deleted

## 2019-10-23 DIAGNOSIS — Z7901 Long term (current) use of anticoagulants: Secondary | ICD-10-CM

## 2019-10-23 DIAGNOSIS — Z95811 Presence of heart assist device: Secondary | ICD-10-CM

## 2019-10-26 ENCOUNTER — Other Ambulatory Visit: Payer: Self-pay

## 2019-10-26 ENCOUNTER — Ambulatory Visit (HOSPITAL_COMMUNITY)
Admission: RE | Admit: 2019-10-26 | Discharge: 2019-10-26 | Disposition: A | Discharge status: Home / Self Care | Payer: Medicare Other | Source: Ambulatory Visit | Attending: Cardiology | Admitting: Cardiology

## 2019-10-26 ENCOUNTER — Ambulatory Visit (HOSPITAL_COMMUNITY)
Admission: RE | Admit: 2019-10-26 | Discharge: 2019-10-26 | Disposition: A | Discharge status: Home / Self Care | Payer: Medicare Other | Source: Ambulatory Visit | Attending: Internal Medicine | Admitting: Internal Medicine

## 2019-10-26 ENCOUNTER — Other Ambulatory Visit (HOSPITAL_COMMUNITY): Payer: Self-pay | Admitting: *Deleted

## 2019-10-26 ENCOUNTER — Ambulatory Visit (HOSPITAL_COMMUNITY): Payer: Self-pay | Admitting: Pharmacist

## 2019-10-26 ENCOUNTER — Encounter (HOSPITAL_COMMUNITY): Payer: Self-pay

## 2019-10-26 DIAGNOSIS — Z7901 Long term (current) use of anticoagulants: Secondary | ICD-10-CM | POA: Diagnosis not present

## 2019-10-26 DIAGNOSIS — Z95811 Presence of heart assist device: Secondary | ICD-10-CM

## 2019-10-26 DIAGNOSIS — Z955 Presence of coronary angioplasty implant and graft: Secondary | ICD-10-CM | POA: Diagnosis not present

## 2019-10-26 DIAGNOSIS — M459 Ankylosing spondylitis of unspecified sites in spine: Secondary | ICD-10-CM | POA: Insufficient documentation

## 2019-10-26 DIAGNOSIS — N200 Calculus of kidney: Secondary | ICD-10-CM | POA: Diagnosis not present

## 2019-10-26 DIAGNOSIS — I252 Old myocardial infarction: Secondary | ICD-10-CM | POA: Diagnosis not present

## 2019-10-26 DIAGNOSIS — I110 Hypertensive heart disease with heart failure: Secondary | ICD-10-CM | POA: Insufficient documentation

## 2019-10-26 DIAGNOSIS — Z79899 Other long term (current) drug therapy: Secondary | ICD-10-CM | POA: Diagnosis not present

## 2019-10-26 DIAGNOSIS — I255 Ischemic cardiomyopathy: Secondary | ICD-10-CM | POA: Insufficient documentation

## 2019-10-26 DIAGNOSIS — Z7982 Long term (current) use of aspirin: Secondary | ICD-10-CM | POA: Diagnosis not present

## 2019-10-26 DIAGNOSIS — I5023 Acute on chronic systolic (congestive) heart failure: Secondary | ICD-10-CM

## 2019-10-26 DIAGNOSIS — Z86711 Personal history of pulmonary embolism: Secondary | ICD-10-CM | POA: Insufficient documentation

## 2019-10-26 DIAGNOSIS — Z8249 Family history of ischemic heart disease and other diseases of the circulatory system: Secondary | ICD-10-CM | POA: Diagnosis not present

## 2019-10-26 DIAGNOSIS — G479 Sleep disorder, unspecified: Secondary | ICD-10-CM

## 2019-10-26 DIAGNOSIS — E119 Type 2 diabetes mellitus without complications: Secondary | ICD-10-CM | POA: Insufficient documentation

## 2019-10-26 DIAGNOSIS — Z86718 Personal history of other venous thrombosis and embolism: Secondary | ICD-10-CM | POA: Diagnosis not present

## 2019-10-26 DIAGNOSIS — Z8674 Personal history of sudden cardiac arrest: Secondary | ICD-10-CM | POA: Diagnosis not present

## 2019-10-26 DIAGNOSIS — I5022 Chronic systolic (congestive) heart failure: Secondary | ICD-10-CM | POA: Diagnosis not present

## 2019-10-26 DIAGNOSIS — Z87891 Personal history of nicotine dependence: Secondary | ICD-10-CM | POA: Diagnosis not present

## 2019-10-26 DIAGNOSIS — I2510 Atherosclerotic heart disease of native coronary artery without angina pectoris: Secondary | ICD-10-CM | POA: Diagnosis not present

## 2019-10-26 DIAGNOSIS — E785 Hyperlipidemia, unspecified: Secondary | ICD-10-CM | POA: Diagnosis not present

## 2019-10-26 DIAGNOSIS — Z5181 Encounter for therapeutic drug level monitoring: Secondary | ICD-10-CM

## 2019-10-26 LAB — PROTIME-INR
INR: 2.4 — ABNORMAL HIGH (ref 0.8–1.2)
Prothrombin Time: 26.3 seconds — ABNORMAL HIGH (ref 11.4–15.2)

## 2019-10-26 LAB — LACTATE DEHYDROGENASE: LDH: 332 U/L — ABNORMAL HIGH (ref 98–192)

## 2019-10-26 MED ORDER — TRAZODONE HCL 100 MG PO TABS: 100.0000 mg | ORAL_TABLET | Freq: Every day | ORAL | 3 refills | Status: DC

## 2019-10-26 NOTE — Progress Notes (Signed)
LVAD INR 

## 2019-10-26 NOTE — Progress Notes (Signed)
Patient presents for sick visit in [facility] today with wife. Reports no problems with VAD equipment or concerns with drive line.  Pt called VAD pager over weekend (Saturday, 10/24/19) to report elevated flows on VAD parameters. Reported seeing flows 7 , 8 , 9 , 10, 11, +++. At the time he reported power was up 6.8 ("usually in the 5 range), PI was baseline 5.4, no VAD alarms noted. He reported he felt "the same as usual". Advised patient per Dr. Haroldine Laws to continue to monitor, if power becomes double digit, call the VAD coordinator. Also, if patient became symptomatic, VAD alarms occur, or flows remained in double digits, he was to call VAD pager. If pt remains stable, he was instructed to come to clinic Monday for INR, LDH and appointment. Pt verbalized agreement to same.   Called pt this am and he says his flows returned to normal on Sunday. He never had VAD alarms and continued to feel "fine".   VAD parameters reviewed, brief power elevations on the 24th to 7.8; no double digits captured. No power elevations noted, PI and speed remained wnl.   Wife reports drive line continues to have bloody drainage even after completing Doxy @ 1 month ago She reports changing dressing every three days using gauze dressing kit. Reports if she waits 4 days, then blood soaks through dressing. Site assessed - see below.    Vital Signs:  Temp: 98.2 Doppler Pressure: 84; 88 Automatc BP:  99/59 (79) HR: 92 SPO2: 97% RA  Weight: 196.4 lb w/o eqt Last weight: 196.2 lbs  VAD Indication: Destination Therapy patient choice    VAD interrogation & Equipment Management: Speed:9200 Flow: 4.6 Power:  6.5w    PI: 5.2  Alarms: none Events: 0 - 5 PI events daily  Fixed speed 9200 Low speed limit: 8600  Primary controller back-up battery expiring in 12 months Back up controller back-up battery expiring in 12 months  Annual Equipment Maintenance on UBC/PM was performed on 12/2018.   I reviewed the  LVAD parameters from today and compared the results to the patient's prior recorded data. LVAD interrogation was NEGATIVE for significant power changes, NEGATIVE for clinical alarms and STABLE for PI events/speed drops. No programming changes were made and pump is functioning within specified parameters. Pt is performing daily controller and system monitor self tests along with completing weekly and monthly maintenance for LVAD equipment.  LVAD equipment check completed and is in good working order. Back-up equipment not present.   Exit Site Care: Drive line is being maintained every three days by his wife, [name]. Existing VAD dressing removed and site care performed using sterile technique. Drive line exit site cleaned with sterile saline wipes x 2, allowed to dry, and gauze dressing without silver strip re-applied. Exit site healed and incorporated, the velour is fully implanted at exit site. Exit site with bright red hypergranulation tissue with small amount bloody drainage noted on dressing. Applied silver nitrate to hypergranulation tissue.  No tenderness or foul odor noted; small rash noted where paper tape had been holding dressing. [name] switched to silk tape and says rash has improved.  Drive line anchor re-applied. Pt denies fever or chills. Provided  Patient with 13 daily dressing kits and silk tape.  Significant Events on VAD Support:  02/21/17> lap chole  Device:none  BP & Labs:  MAP 84 - Doppler is reflecting MAP  Hgb not done at this visit - No S/S of bleeding. Specifically denies melena/BRBPR or nosebleeds.  LDH 332 and is within established baseline of 250- 380. Pt denies tea-colored urine. No power elevations noted on interrogation. We will continue to check this with his INR.    Patient Instructions:  1. No change in medications. 2. If DL site hypergranulation tissue does not resolve, we will re-assess next INR visit. [name] will call us if this will be necessary. 3. Keep  scheduled appointment in [facility] on 12/07/19. 4. [name] (PharmD) spoke with patient re: INR and warfarin dosing.  5. Per Dr. Haroldine Laws, increase INR goal to 2.3 - 3.0 - updated Audry Riles, PharmD.   Zada Girt RN [place] Coordinator  Office: [phone]  24/7 Pager: [phone]

## 2019-10-27 NOTE — Progress Notes (Signed)
Patient ID: Scott [name], male   DOB: [birth year], 74 y.o.   MRN: [id number]     Primary Cardiologist: Dr Haroldine Laws INR : [facility]  HPI: Scott [name] is a 74 y/o male with h/o CAD with ankylosing spondylitis, DM2, carotid stenting, severe ischemic CM s/p anterior STEM with VT arrest in 12/15, large PE with pulmonary infarct in 2/15 s/p IVC filter and systolic HF with EF 14%.  He underwent HM II LVAD placement on 09/20/13. He had a prolonged hospital course due to recurrent chest bleeding, severe CO2 retention and RV failure.  In 8/18 admitted with acute gallstone pancreatitis. Underwent lap chole 02/21/17.   Admitted 2/18 with elevated LDH. He was started on bivalirudin. LDH went down [phone].   He presents today for sick visit. He called over the weekend to report elevated flows on VAD parameters. Reported seeing flows 7 , 8 , 9 , 10, 11, +++. At the time he reported power was up 6.8 ("usually in the 5 range), PI was baseline 5.4, no VAD alarms noted. Denies any symptoms associated with it - said he was just looking at numbers. Denies dark urine or missing any coumadin doses. Otherwise continues to feel very good. Doing all activities without limitation. Denies orthopnea or PND. No fevers, chills or problems with driveline. No bleeding, melena or neuro symptoms. Taking all meds as prescribed.   Wife reports drive line continues to have bloody drainage even after completing Doxy @ 1 month ago She reports changing dressing every three days using gauze dressing kit. Reports if she waits 4 days, then blood soaks through dressing. Site assessed - see below.   VAD Indication: Destination Therapy patient choice    VAD interrogation & Equipment Management: Speed:9200 Flow: 4.6 Power:  6.5w PI: 5.2  Alarms: none Events: 0 - 5 PI events daily  Fixed speed 9200 Low speed limit: 8600  Primary controller back-up battery expiring in 12 months Back up controller back-up battery  expiring in 12 months  Annual Equipment Maintenance on UBC/PM was performed on 12/2018.  I reviewed the LVAD parameters from todayand compared the results to the patient's prior recorded data.LVAD interrogation was NEGATIVEfor significant power changes, NEGATIVEfor clinicalalarms and STABLEfor PI events/speed drops. No programming changes were madeand pump is functioning within specified parameters. Pt is performing daily controller and system monitor self tests along with completing weekly and monthly maintenance for LVAD equipment.   SH:  Social History   Socioeconomic History  . Marital status: Married    Spouse name: Not on file  . Number of children: Not on file  . Years of education: Not on file  . Highest education level: Not on file  Occupational History  . Occupation: retired  Tobacco Use  . Smoking status: Former Smoker    Packs/day: 0.30    Years: 0.00    Pack years: 0.00    Types: Cigarettes    Quit date: 06/12/1969    Years since quitting: 50.4  . Smokeless tobacco: Never Used  . 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  Substance and Sexual Activity  . Alcohol use: No    Alcohol/week: 0.0 standard drinks  . Drug use: No  . Sexual activity: Not Currently  Other Topics Concern  . Not on file  Social History Narrative  . Not on file   Social Determinants of Health   Financial Resource Strain:   . Difficulty of Paying Living Expenses:   Food Insecurity:   .  Worried About Charity fundraiser in the Last Year:   . Arboriculturist in the Last Year:   Transportation Needs:   . Film/video editor (Medical):   Marland Kitchen Lack of Transportation (Non-Medical):   Physical Activity:   . Days of Exercise per Week:   . Minutes of Exercise per Session:   Stress:   . Feeling of Stress :   Social Connections:   . Frequency of Communication with Friends and Family:   . Frequency of Social Gatherings with Friends and Family:   . Attends  Religious Services:   . Active Member of Clubs or Organizations:   . Attends Archivist Meetings:   Marland Kitchen Marital Status:   Intimate Partner Violence:   . Fear of Current or Ex-Partner:   . Emotionally Abused:   Marland Kitchen Physically Abused:   . Sexually Abused:     FH:  Family History  Problem Relation Age of Onset  . Hypertension Father   . Heart attack Father   . Heart Problems Sister     Past Medical History:  Diagnosis Date  . Acute on chronic respiratory failure ([place])    a. 08/2014 in setting of PE.  Marland Kitchen Ankylosing spondylitis ([place])   . Bilateral pulmonary embolism ([place])    a. 08/2014 - started on Coumadin. Retrievable IVC filter placed 08/27/14 due to RV strain and large clot burden.  Marland Kitchen CAD (coronary artery disease)    a. stenting of LCx 2013; b. STEMI 06/12/14 s/p PCI to LAD complicated by post cath shock requiring IABP; VT s/p DCCV, EF 20%; c. NSTEMI 06/26/14 treated medically.  . Carotid artery disease ([place])    a. s/p stenting.  . Chronic systolic CHF (congestive heart failure) ([place])   . Diabetes ([place])   . Hemoptysis    a. 08/2014 possibly due to pulm infarct/PE.  Marland Kitchen Hyperlipidemia   . Hypertension   . Hypotension   . Ischemic cardiomyopathy    a. echo 08/23/2014 EF <20%, dilated CM, mod MR/TR  . Leukocytosis   . Leukocytosis 09/10/2014  . Nephrolithiasis   . Pleural effusion on right 08/2014 - small  . Reactive thrombocytosis 09/10/2014  . Right leg DVT ([place])    a. 08/2014.  Marland Kitchen Ventricular tachycardia ([place])    a. 06/2014 at time of MI, s/p DCCV.   Current Outpatient Medications  Medication Sig Dispense Refill  . acetaminophen (TYLENOL) 500 MG tablet Take 1,000 mg by mouth every 6 (six) hours as needed for moderate pain.     Marland Kitchen aspirin EC 81 MG tablet Take 1 tablet (81 mg total) by mouth daily. 90 tablet 3  . clopidogrel (PLAVIX) 75 MG tablet Take 1 tablet (75 mg total) by mouth daily. 90 tablet 3  . cyclobenzaprine (FLEXERIL) 5 MG tablet Take 1 tablet (5 mg total) by  mouth 3 (three) times daily as needed for muscle spasms. 270 tablet 3  . doxycycline (VIBRAMYCIN) 50 MG capsule Take 2 capsules (100 mg total) by mouth 2 (two) times daily. 28 capsule 0  . furosemide (LASIX) 20 MG tablet Take 1 tablet (20 mg total) by mouth daily. Take 20 mg daily; may take 40 mg if weight gain 120 tablet 3  . pantoprazole (PROTONIX) 40 MG tablet Take 1 tablet (40 mg total) by mouth at bedtime. 90 tablet 3  . potassium chloride SA (K-DUR,KLOR-CON) 20 MEQ tablet Every other day alternate 20 mEq (1 tablet) with 40 mEq (2 tablets) 135 tablet 3  .  rosuvastatin (CRESTOR) 10 MG tablet Take 1 tablet (10 mg total) by mouth at bedtime. 90 tablet 3  . sildenafil (VIAGRA) 100 MG tablet Take 1 tablet (100 mg total) by mouth daily as needed for erectile dysfunction. 10 tablet 3  . traMADol (ULTRAM) 50 MG tablet Take 2 tablets (100 mg total) by mouth every 6 (six) hours as needed for moderate pain. (Patient taking differently: Take 50 mg by mouth every 6 (six) hours as needed for moderate pain. ) 120 tablet 3  . traZODone (DESYREL) 100 MG tablet Take 1 tablet (100 mg total) by mouth at bedtime. 90 tablet 3  . warfarin (JANTOVEN) 6 MG tablet TAKE 1 TABLET DAILY EXCEPT TAKE 1 AND 1/2 TABLETS (9  MG) ON WEDNESDAY 100 tablet 3   No current facility-administered medications for this encounter.    Vital Signs:  Temp: 98.2 Doppler Pressure: 84; 88 Automatc BP:  99/59 (79) HR: 92 SPO2: 97% RA  Weight: 196.4 lb w/o eqt Last weight: 196.2 lbs  Wt Readings from Last 3 Encounters:  09/30/19 84.4 kg (186 lb)  09/14/19 89 kg (196 lb 3.2 oz)  06/15/19 89.8 kg (198 lb)   Physical Exam: General:  NAD.  HEENT: normal  Neck: supple. JVP not elevated.  Carotids 2+ bilat; no bruits. No lymphadenopathy or thryomegaly appreciated. Cor: LVAD hum.  Lungs: Clear. Abdomen:  soft, nontender, non-distended. No hepatosplenomegaly. No bruits or masses. Good bowel sounds. Driveline site clean. Anchor in  place.  Extremities: no cyanosis, clubbing, rash. Warm no edema  Neuro: alert & oriented x 3. No focal deficits. Moves all 4 without problem    ASSESSMENT & PLAN:  1. Chronic systolic HF: Ischemic cardiomyopathy, EF 20% s/p HMII LVAD 09/20/13.   - Doing very well. Now over 6 years out.  - Stable NYHA I-II with VAD support  - Volume status looks good.  - Refused transplant w/u. Now age prohibited  2. CAD s/p Anterior STEMI on 06/12/14 with stenting of LAD:  - No s/s ischemia - Continue statin/ASA  3. VAD management:  - Had admit in 08/2017 for elevated LDH.  - LDH stabilized 300-380 - Presented today for flow elevations over w/e. Parameters today are normal with no power spikes on interrogation. Pump sounds fine. LDH stable 332. ? Transient/passed clot. Follow closely.  - Off asa. On coumadin.  - INR 2.4.  Will try to keep INR 2.3- 2.8.  - Continue DAPT - Small amount of blood drainage from DL. VAD coordinator addressed. Site ok. No evidence infection.    4. Ankylosing spondylitis: Off NSAIDS. -  Follows with rheumatology. Uses tramadol. Avoids NSAIDs as much as possible. -  Pain reasonable well controlled  - No change  5. HTN:  - MAPs ok   6. Kidney Stones  - CT with renal calculi bilaterally. Asymptomatic. No intervention needed at this time.  Now followed by Dr Tresa Moore at [facility]. - No change  Total time spent 35 minutes. Over half that time spent discussing above.   [name], MD  10/27/2019

## 2019-10-28 ENCOUNTER — Inpatient Hospital Stay (HOSPITAL_COMMUNITY): Admission: RE | Admit: 2019-10-28 | Payer: Medicare Other | Source: Ambulatory Visit

## 2019-11-05 ENCOUNTER — Other Ambulatory Visit (HOSPITAL_COMMUNITY): Payer: Self-pay | Admitting: Unknown Physician Specialty

## 2019-11-05 DIAGNOSIS — Z7901 Long term (current) use of anticoagulants: Secondary | ICD-10-CM

## 2019-11-05 DIAGNOSIS — Z95811 Presence of heart assist device: Secondary | ICD-10-CM

## 2019-11-11 ENCOUNTER — Ambulatory Visit (HOSPITAL_COMMUNITY): Payer: Self-pay | Admitting: Pharmacist

## 2019-11-11 ENCOUNTER — Ambulatory Visit (HOSPITAL_COMMUNITY)
Admission: RE | Admit: 2019-11-11 | Discharge: 2019-11-11 | Disposition: A | Discharge status: Home / Self Care | Payer: Medicare Other | Source: Ambulatory Visit | Attending: Internal Medicine | Admitting: Internal Medicine

## 2019-11-11 ENCOUNTER — Other Ambulatory Visit (HOSPITAL_COMMUNITY): Payer: Medicare Other

## 2019-11-11 ENCOUNTER — Other Ambulatory Visit: Payer: Self-pay

## 2019-11-11 DIAGNOSIS — Z95811 Presence of heart assist device: Secondary | ICD-10-CM

## 2019-11-11 DIAGNOSIS — Z7901 Long term (current) use of anticoagulants: Secondary | ICD-10-CM | POA: Insufficient documentation

## 2019-11-11 DIAGNOSIS — T827XXA Infection and inflammatory reaction due to other cardiac and vascular devices, implants and grafts, initial encounter: Secondary | ICD-10-CM | POA: Insufficient documentation

## 2019-11-11 DIAGNOSIS — Z4801 Encounter for change or removal of surgical wound dressing: Secondary | ICD-10-CM | POA: Diagnosis not present

## 2019-11-11 DIAGNOSIS — Z5181 Encounter for therapeutic drug level monitoring: Secondary | ICD-10-CM

## 2019-11-11 LAB — LACTATE DEHYDROGENASE: LDH: 360 U/L — ABNORMAL HIGH (ref 98–192)

## 2019-11-11 LAB — PROTIME-INR
INR: 2.4 — ABNORMAL HIGH (ref 0.8–1.2)
Prothrombin Time: 24.9 seconds — ABNORMAL HIGH (ref 11.4–15.2)

## 2019-11-11 NOTE — Progress Notes (Signed)
LVAD INR 

## 2019-11-11 NOTE — Addendum Note (Signed)
Encounter addended by: Lezlie Octave, RN on: 11/11/2019 11:17 AM  Actions taken: Clinical Note Signed

## 2019-11-11 NOTE — Addendum Note (Signed)
Encounter addended by: Lezlie Octave, RN on: 11/11/2019 10:30 AM  Actions taken: Visit diagnoses modified, Order list changed, Diagnosis association updated

## 2019-11-11 NOTE — Progress Notes (Signed)
Pt presents for labs and drive line exit dressing change.   Wife reports she has been changing every three days with "some" bloody drainage. Current dressing noted to be saturated with bright red bloody drainage. Dr. Darcey Nora in to assess drive line site.   Exit Site Care: Existing VAD dressing removed and site care performed using sterile technique. Drive line exit site cleaned with sterile saline wipes x 2, allowed to dry.   Dr. [name] performed dressing change. Exit site healed and incorporated, the velour is fully implanted at exit site. Exit site with bright red hypergranulation tissue (improved from two weeks ago) with large amount bloody drainage noted on dressing. Site tunneled @ 0.5 cm, wound culture obtained.  Applied silver nitrate to hypergranulation tissue.  No tenderness or foul odor noted. DL site covered with Xeroform strip, gauze dressing, and silk tape; drive line anchor re-applied. Pt denies fever or chills. Provided  Patient with 7 daily dressing kits and Xeroform gauze for home use.    Dr. Darcey Nora instructed pt and [name] to hold Aspirin until DL site healed. She may perform dressing changes every other day using Xeroform gauze around exit site. Change more often if any drainage noted on dressing. Pt will return in 2 weeks for labs and re-check of DL site if not completely healed. Pt and [name] know if site does not improve or worsens, they are to call for clinic visit sooner.   Zada Girt RN, [place] Coordinator [phone]

## 2019-11-12 ENCOUNTER — Other Ambulatory Visit (HOSPITAL_COMMUNITY): Payer: Self-pay | Admitting: *Deleted

## 2019-11-12 ENCOUNTER — Other Ambulatory Visit: Payer: Self-pay

## 2019-11-12 ENCOUNTER — Ambulatory Visit (HOSPITAL_COMMUNITY)
Admission: RE | Admit: 2019-11-12 | Discharge: 2019-11-12 | Disposition: A | Discharge status: Home / Self Care | Payer: Medicare Other | Source: Ambulatory Visit | Attending: Cardiology | Admitting: Cardiology

## 2019-11-12 ENCOUNTER — Other Ambulatory Visit (HOSPITAL_COMMUNITY): Payer: Self-pay | Admitting: Unknown Physician Specialty

## 2019-11-12 DIAGNOSIS — T827XXA Infection and inflammatory reaction due to other cardiac and vascular devices, implants and grafts, initial encounter: Secondary | ICD-10-CM

## 2019-11-12 DIAGNOSIS — Z95811 Presence of heart assist device: Secondary | ICD-10-CM | POA: Insufficient documentation

## 2019-11-12 DIAGNOSIS — Y838 Other surgical procedures as the cause of abnormal reaction of the patient, or of later complication, without mention of misadventure at the time of the procedure: Secondary | ICD-10-CM | POA: Insufficient documentation

## 2019-11-12 DIAGNOSIS — Z7901 Long term (current) use of anticoagulants: Secondary | ICD-10-CM

## 2019-11-12 LAB — CBC
HCT: 48 % (ref 39.0–52.0)
Hemoglobin: 15.2 g/dL (ref 13.0–17.0)
MCH: 30 pg (ref 26.0–34.0)
MCHC: 31.7 g/dL (ref 30.0–36.0)
MCV: 94.9 fL (ref 80.0–100.0)
Platelets: 348 10*3/uL (ref 150–400)
RBC: 5.06 MIL/uL (ref 4.22–5.81)
RDW: 16 % — ABNORMAL HIGH (ref 11.5–15.5)
WBC: 11.6 10*3/uL — ABNORMAL HIGH (ref 4.0–10.5)
nRBC: 0 % (ref 0.0–0.2)

## 2019-11-12 LAB — PROTIME-INR
INR: 2.4 — ABNORMAL HIGH (ref 0.8–1.2)
Prothrombin Time: 25.2 seconds — ABNORMAL HIGH (ref 11.4–15.2)

## 2019-11-12 LAB — LACTATE DEHYDROGENASE: LDH: 306 U/L — ABNORMAL HIGH (ref 98–192)

## 2019-11-12 NOTE — Progress Notes (Signed)
Pt presents for labs and drive line exit dressing change.   Pt had multiple pages due to driveline bleeding. Wife states that she had to change the driveline dressing last night at 7p and then again this morning at 5am due to the dressing being saturated.   Upon arrival to clinic pt had towels around site, the dressing was soaked w/blood.  Exit Site Care: Existing VAD dressing removed and site care performed using sterile technique. There were several blood clots around the driveline. driveline actively bleeding. Silver nitrate was used to cauterized around the driveline. Driveline tunnels approx 2-3 cm. Once cauterized there was an area of active bleeding at 3 oclock on the site. Silver nitrate applied to this area. MRDH applied inside the driveline and on the outside. No further bleeding at the site. Drive line exit site cleaned with sterile saline wipes x 2, allowed to dry. Split gauze applied with pressure dressing. Will observe pt for an hour to assess for any further bleeding/    Pt/caregiver instructed to leave dressing in place until he returns to clinic on Monday. Unless saturated, please page VAD pager.   [name] RN, [place] Coordinator [phone]

## 2019-11-14 LAB — AEROBIC CULTURE W GRAM STAIN (SUPERFICIAL SPECIMEN): Gram Stain: NONE SEEN

## 2019-11-16 ENCOUNTER — Ambulatory Visit (HOSPITAL_COMMUNITY): Payer: Self-pay | Admitting: Pharmacist

## 2019-11-16 ENCOUNTER — Ambulatory Visit (HOSPITAL_COMMUNITY)
Admission: RE | Admit: 2019-11-16 | Discharge: 2019-11-16 | Disposition: A | Discharge status: Home / Self Care | Payer: Medicare Other | Source: Ambulatory Visit | Attending: Internal Medicine | Admitting: Internal Medicine

## 2019-11-16 ENCOUNTER — Telehealth (HOSPITAL_COMMUNITY): Payer: Self-pay | Admitting: *Deleted

## 2019-11-16 ENCOUNTER — Other Ambulatory Visit: Payer: Self-pay

## 2019-11-16 DIAGNOSIS — T827XXA Infection and inflammatory reaction due to other cardiac and vascular devices, implants and grafts, initial encounter: Secondary | ICD-10-CM

## 2019-11-16 DIAGNOSIS — Z95811 Presence of heart assist device: Secondary | ICD-10-CM

## 2019-11-16 DIAGNOSIS — Z7901 Long term (current) use of anticoagulants: Secondary | ICD-10-CM | POA: Diagnosis not present

## 2019-11-16 DIAGNOSIS — Z5181 Encounter for therapeutic drug level monitoring: Secondary | ICD-10-CM

## 2019-11-16 LAB — PROTIME-INR
INR: 2.4 — ABNORMAL HIGH (ref 0.8–1.2)
Prothrombin Time: 25 seconds — ABNORMAL HIGH (ref 11.4–15.2)

## 2019-11-16 LAB — LACTATE DEHYDROGENASE: LDH: 263 U/L — ABNORMAL HIGH (ref 98–192)

## 2019-11-16 MED ORDER — DOXYCYCLINE HYCLATE 50 MG PO CAPS: 100.0000 mg | ORAL_CAPSULE | Freq: Two times a day (BID) | ORAL | 0 refills | Status: DC

## 2019-11-16 NOTE — Progress Notes (Signed)
Pt presents for labs and drive line exit dressing change with wife.   Last dressing change was performed here last Thursday. Dressing has remained and is C/D/I.   Exit Site Care: Existing VAD dressing removed and site care performed using sterile technique. Dressing with dark bloody drainage, no hypergranulated tissue noted. Exit site with some redness around site, no tenderness, or foul odor noted. Drive line exit site cleaned with sterile saline wipes x 2, allowed to dry. Small guaze on exit site covered with guaze dressing. Wife applied topical ointment on skin rash around dressing. Asked wife to perform every other day dressing changes for now. Will review with Dr. Darcey Nora and call with any changes/additional instructions.        Zada Girt RN, [place] Coordinator [phone]

## 2019-11-16 NOTE — Addendum Note (Signed)
Encounter addended by: Lezlie Octave, RN on: 11/16/2019 2:15 PM  Actions taken: Clinical Note Signed

## 2019-11-16 NOTE — Progress Notes (Signed)
LVAD INR 

## 2019-11-16 NOTE — Telephone Encounter (Signed)
Called patient per Dr. Darcey Nora. Instructed pt per Dr. Darcey Nora to start Doxy 100 mg bid for 10 days.   Also, instructed to continue every other day dressing changes through this week and advance as drainage allows starting next week. Wife verbalized understanding of same.   Zada Girt RN, [place] Coordinator [phone]

## 2019-11-17 ENCOUNTER — Telehealth (HOSPITAL_COMMUNITY): Payer: Self-pay | Admitting: *Deleted

## 2019-11-17 NOTE — Telephone Encounter (Signed)
Called patient's wife per Dr. Haroldine Laws. Pt may re-start aspirin 81 mg daily. [name] verbalized understanding of same.  Zada Girt RN, [place] Coordinator [phone]

## 2019-11-25 ENCOUNTER — Other Ambulatory Visit (HOSPITAL_COMMUNITY): Payer: Medicare Other

## 2019-11-26 ENCOUNTER — Other Ambulatory Visit: Payer: Self-pay

## 2019-11-26 ENCOUNTER — Ambulatory Visit (HOSPITAL_COMMUNITY)
Admission: RE | Admit: 2019-11-26 | Discharge: 2019-11-26 | Disposition: A | Discharge status: Home / Self Care | Payer: Medicare Other | Source: Ambulatory Visit | Attending: Cardiology | Admitting: Cardiology

## 2019-11-26 ENCOUNTER — Ambulatory Visit (HOSPITAL_COMMUNITY): Payer: Self-pay | Admitting: Pharmacist

## 2019-11-26 ENCOUNTER — Other Ambulatory Visit (HOSPITAL_COMMUNITY): Payer: Self-pay | Admitting: *Deleted

## 2019-11-26 DIAGNOSIS — Z7901 Long term (current) use of anticoagulants: Secondary | ICD-10-CM | POA: Diagnosis not present

## 2019-11-26 DIAGNOSIS — Z95811 Presence of heart assist device: Secondary | ICD-10-CM

## 2019-11-26 DIAGNOSIS — Z5181 Encounter for therapeutic drug level monitoring: Secondary | ICD-10-CM

## 2019-11-26 LAB — LACTATE DEHYDROGENASE: LDH: 315 U/L — ABNORMAL HIGH (ref 98–192)

## 2019-11-26 LAB — PROTIME-INR
INR: 2.9 — ABNORMAL HIGH (ref 0.8–1.2)
Prothrombin Time: 29.1 seconds — ABNORMAL HIGH (ref 11.4–15.2)

## 2019-11-26 NOTE — Progress Notes (Signed)
LVAD INR 

## 2019-12-02 ENCOUNTER — Encounter (HOSPITAL_COMMUNITY): Payer: Medicare Other

## 2019-12-07 ENCOUNTER — Encounter (HOSPITAL_COMMUNITY): Payer: Medicare Other

## 2019-12-08 ENCOUNTER — Other Ambulatory Visit (HOSPITAL_COMMUNITY): Payer: Self-pay | Admitting: *Deleted

## 2019-12-08 DIAGNOSIS — Z7901 Long term (current) use of anticoagulants: Secondary | ICD-10-CM

## 2019-12-08 DIAGNOSIS — Z95811 Presence of heart assist device: Secondary | ICD-10-CM

## 2019-12-08 DIAGNOSIS — E611 Iron deficiency: Secondary | ICD-10-CM

## 2019-12-08 DIAGNOSIS — I5023 Acute on chronic systolic (congestive) heart failure: Secondary | ICD-10-CM

## 2019-12-09 ENCOUNTER — Encounter (HOSPITAL_COMMUNITY): Payer: Self-pay

## 2019-12-09 ENCOUNTER — Ambulatory Visit (HOSPITAL_COMMUNITY): Payer: Self-pay | Admitting: Pharmacist

## 2019-12-09 ENCOUNTER — Ambulatory Visit (HOSPITAL_COMMUNITY)
Admission: RE | Admit: 2019-12-09 | Discharge: 2019-12-09 | Disposition: A | Discharge status: Home / Self Care | Payer: Medicare Other | Source: Ambulatory Visit | Attending: Cardiology | Admitting: Cardiology

## 2019-12-09 ENCOUNTER — Other Ambulatory Visit: Payer: Self-pay

## 2019-12-09 VITALS — BP 95/65 | HR 81 | Temp 97.5°F | Ht 68.0 in | Wt 195.8 lb

## 2019-12-09 DIAGNOSIS — G479 Sleep disorder, unspecified: Secondary | ICD-10-CM | POA: Diagnosis not present

## 2019-12-09 DIAGNOSIS — I5023 Acute on chronic systolic (congestive) heart failure: Secondary | ICD-10-CM | POA: Insufficient documentation

## 2019-12-09 DIAGNOSIS — Z95811 Presence of heart assist device: Secondary | ICD-10-CM

## 2019-12-09 DIAGNOSIS — Z5181 Encounter for therapeutic drug level monitoring: Secondary | ICD-10-CM

## 2019-12-09 DIAGNOSIS — Z7901 Long term (current) use of anticoagulants: Secondary | ICD-10-CM | POA: Insufficient documentation

## 2019-12-09 DIAGNOSIS — T827XXA Infection and inflammatory reaction due to other cardiac and vascular devices, implants and grafts, initial encounter: Secondary | ICD-10-CM

## 2019-12-09 DIAGNOSIS — E611 Iron deficiency: Secondary | ICD-10-CM | POA: Insufficient documentation

## 2019-12-09 DIAGNOSIS — Z79811 Long term (current) use of aromatase inhibitors: Secondary | ICD-10-CM | POA: Diagnosis not present

## 2019-12-09 LAB — BASIC METABOLIC PANEL
Anion gap: 9 (ref 5–15)
BUN: 13 mg/dL (ref 8–23)
CO2: 25 mmol/L (ref 22–32)
Calcium: 8.9 mg/dL (ref 8.9–10.3)
Chloride: 104 mmol/L (ref 98–111)
Creatinine, Ser: 1.07 mg/dL (ref 0.61–1.24)
GFR calc Af Amer: >60 mL/min (ref >60)
GFR calc non Af Amer: >60 mL/min (ref >60)
Glucose, Bld: 76 mg/dL (ref 70–99)
Potassium: 4.1 mmol/L (ref 3.5–5.1)
Sodium: 138 mmol/L (ref 135–145)

## 2019-12-09 LAB — PROTIME-INR
INR: 1.9 — ABNORMAL HIGH (ref 0.8–1.2)
Prothrombin Time: 21 seconds — ABNORMAL HIGH (ref 11.4–15.2)

## 2019-12-09 LAB — CBC
HCT: 45 % (ref 39.0–52.0)
Hemoglobin: 14 g/dL (ref 13.0–17.0)
MCH: 29.9 pg (ref 26.0–34.0)
MCHC: 31.1 g/dL (ref 30.0–36.0)
MCV: 95.9 fL (ref 80.0–100.0)
Platelets: 288 10*3/uL (ref 150–400)
RBC: 4.69 MIL/uL (ref 4.22–5.81)
RDW: 15.4 % (ref 11.5–15.5)
WBC: 10.2 10*3/uL (ref 4.0–10.5)
nRBC: 0 % (ref 0.0–0.2)

## 2019-12-09 LAB — LACTATE DEHYDROGENASE: LDH: 352 U/L — ABNORMAL HIGH (ref 98–192)

## 2019-12-09 MED ORDER — TRAZODONE HCL 100 MG PO TABS: 200.0000 mg | ORAL_TABLET | Freq: Every day | ORAL | 3 refills | Status: DC

## 2019-12-09 MED ORDER — CEPHALEXIN 500 MG PO CAPS: 500.0000 mg | ORAL_CAPSULE | Freq: Three times a day (TID) | ORAL | 0 refills | Status: DC

## 2019-12-09 NOTE — Progress Notes (Signed)
Patient presents for 2 month follow up in [facility] today with wife. Reports no problems with VAD equipment and reports DL drainage has decreased. Wife has advanced to weekly dressing change.   Pt very drowsy today; chief complaint is fatigue due to lack of sleep. Pt reports he has only been getting 2 hrs of sleep each night. He falls asleep easily, but wakes in two hours and "can't go back to sleep". Sleeping through night has been a chronic problem, but he usually sleeps 6 hrs, takes Flexeril and could go back to sleep. Pt has tried Tylenol PM and melatonin in past, pt says "didn't work". He has taken Ambien in the past, but wife reports confusion and "too many side effects" from that drug. Pt currently taking Trazadone 100 mg q hs; pt reports it "doesn't work". Dr. Haroldine Laws increased Trazadone to 200 mg q hs, Rx sent.   Dr. Haroldine Laws asked for home sleep study for pt. Pt would like to wait and see if above medication change helps. Asked them to update VAD Coordinator when and if he is willing to have home sleep study performed.   Wife reports DL site drainage has decreased and she advanced to weekly dressing change this week. She does report reddened area around site. She says this type rash "comes and goes" on his skin especially with heat. She will switch to gauze dressings for better absorption. Dr. Haroldine Laws assessed, will start Keflex 500 mg tid for 7 days.   Wife and patient report VAD flows have been above baseline at times at 5 - 7; powers are sometimes elevated to same range. See below.  Vital Signs:  Temp: 97.5 Doppler Pressure: 82 Automatc BP:  95/65 (79) HR: 81 SPO2: 96% RA  Weight: 195.8 lb w/o eqt Last weight: 196.4 lbs  VAD Indication: Destination Therapy patient choice    VAD interrogation & Equipment Management: Speed: 9200 Flow: 5.7 - 7.4 Power:  5.2 - 7.0 w    PI: 5.0  Alarms: none Events: rare  Fixed speed 9200 Low speed limit: 8600  Primary controller  back-up battery expiring in 10 months Back up controller back-up battery expiring in 10 months  Annual Equipment Maintenance on UBC/PM was performed today on 12/09/19.  I reviewed the LVAD parameters from today and compared the results to the patient's prior recorded data. LVAD interrogation was NEGATIVE for significant power changes, NEGATIVE for clinical alarms and STABLE for PI events/speed drops. No programming changes were made and pump is functioning within specified parameters. Pt is performing daily controller and system monitor self tests along with completing weekly and monthly maintenance for LVAD equipment.  LVAD equipment check completed and is in good working order. Back-up equipment present.   Exit Site Care: Drive line is being maintained weekly  by his wife, [name]. Drive line exit site well healed and incorporated. The velour is fully implanted at exit site. Dressing dry and intact. Stabilization device present and accurately applied. Pt denies fever or chills. 16 anchors, sorbaview dressings, 1 box of adhesive remover, 7 daily dressing kits provided for home use.    Significant Events on VAD Support:  02/21/17> lap chole  Device:none   BP & Labs:  MAP 82 - Doppler is reflecting MAP  Hgb 15.2 - No S/S of bleeding. Specifically denies melena/BRBPR or nosebleeds.  LDH 352 and is within established baseline of 250- 360. Pt denies tea-colored urine. No power elevations noted on interrogation. We will continue to check this with his  INR.    Batteries Manufacture Date: Number of uses: Re-calibration  04/11/17 175 - 180 Performed by patient   Annual maintenance completed per [employer] on patient's home power module and Electrical engineer.    Backup system controller 11 volt battery charged at home on regular basis.    Patient Instructions:  1. Increase Trazadone to 200 mg nightly. 2. Keflex 500 mg three times daily for redness around drive line site. 3. DL site  check next lab visit on 12/23/19 4. Return to [facility] in 3 mos for 5.5 yr [place] RN [place] Coordinator  Office: [phone]  24/7 Pager: [phone]

## 2019-12-09 NOTE — Progress Notes (Signed)
LVAD INR 

## 2019-12-09 NOTE — Patient Instructions (Signed)
1. Increase Trazadone to 200 mg nightly. 2. Keflex 500 mg three times daily for redness around drive line site. 3. DL site check next lab visit on 12/23/19 4. Return to [facility] in 3 mos for 5.5 yr [place]

## 2019-12-10 ENCOUNTER — Telehealth: Payer: Self-pay | Admitting: *Deleted

## 2019-12-10 ENCOUNTER — Telehealth (HOSPITAL_COMMUNITY): Payer: Self-pay | Admitting: *Deleted

## 2019-12-10 DIAGNOSIS — T827XXA Infection and inflammatory reaction due to other cardiac and vascular devices, implants and grafts, initial encounter: Secondary | ICD-10-CM

## 2019-12-10 MED ORDER — CEPHALEXIN 500 MG PO CAPS: 500.0000 mg | ORAL_CAPSULE | Freq: Three times a day (TID) | ORAL | 0 refills | Status: DC

## 2019-12-10 NOTE — Telephone Encounter (Signed)
Wife called to ask if Keflex could be sent to [facility]; [facility] does not have in stock. Rx re-sent to [facility].   Zada Girt RN, VAD [place]

## 2019-12-13 NOTE — Progress Notes (Signed)
Patient ID: ZION TA, male   DOB: [birth year], 74 y.o.   MRN: [id number]     Primary Cardiologist: Dr Haroldine Laws INR : [facility] [place]  HPI: Scott [name] is a 74 y/o male with h/o CAD with ankylosing spondylitis, DM2, carotid stenting, severe ischemic CM s/p anterior STEM with VT arrest in 12/15, large PE with pulmonary infarct in 2/15 s/p IVC filter and systolic HF with EF 08%.  He underwent HM II LVAD placement on 09/20/13. He had a prolonged hospital course due to recurrent chest bleeding, severe CO2 retention and RV failure.  In 8/18 admitted with acute gallstone pancreatitis. Underwent lap chole 02/21/17.   Admitted 2/18 with elevated LDH. He was started on bivalirudin. LDH went down [phone].   He presents today for routine visit. Says he has been very fatigued. Not sleeping well at all. Only gets a few hours per night. Denies snoring. Taking trazodone 100 qhs. Falls asleep but then gets up quickly. Also having some redness around DL. No fevers or chills. Denies orthopnea or PND.Marland Kitchen No bleeding, melena or neuro symptoms. No VAD alarms. Taking all meds as prescribed.   VAD Indication: Destination Therapy patient choice    VAD interrogation & Equipment Management: Speed: 9200 Flow: 5.7 - 7.4 Power:  5.2 - 7.0 w PI: 5.0  Alarms: none Events: rare  Fixed speed 9200 Low speed limit: 8600  Primary controller back-up battery expiring in 10 months Back up controller back-up battery expiring in 10 months  Annual Equipment Maintenance on UBC/PM was performed today on 12/09/19.   SH:  Social History   Socioeconomic History   Marital status: Married    Spouse name: Not on file   Number of children: Not on file   Years of education: Not on file   Highest education level: Not on file  Occupational History   Occupation: retired  Tobacco Use   Smoking status: Former Smoker    Packs/day: 0.30    Years: 0.00    Pack years: 0.00    Types: Cigarettes    Quit date:  06/12/1969    Years since quitting: 50.5   Smokeless tobacco: Never Used   Tobacco comment: pt smoked while in [employer] 2-3 cig x 6 months.  Vaping Use   Vaping Use: Former  Substance and Sexual Activity   Alcohol use: No    Alcohol/week: 0.0 standard drinks   Drug use: No   Sexual activity: Not Currently  Other Topics Concern   Not on file  Social History Narrative   Not on file   Social Determinants of Health   Financial Resource Strain:    Difficulty of Paying Living Expenses:   Food Insecurity:    Worried About Charity fundraiser in the Last Year:    Arboriculturist in the Last Year:   Transportation Needs:    Film/video editor (Medical):    Lack of Transportation (Non-Medical):   Physical Activity:    Days of Exercise per Week:    Minutes of Exercise per Session:   Stress:    Feeling of Stress :   Social Connections:    Frequency of Communication with Friends and Family:    Frequency of Social Gatherings with Friends and Family:    Attends Religious Services:    Active Member of Clubs or Organizations:    Attends Archivist Meetings:    Marital Status:   Intimate Partner Violence:    Fear of Current or Ex-Partner:  Emotionally Abused:    Physically Abused:    Sexually Abused:     FH:  Family History  Problem Relation Age of Onset   Hypertension Father    Heart attack Father    Heart Problems Sister     Past Medical History:  Diagnosis Date   Acute on chronic respiratory failure ([place])    a. 08/2014 in setting of PE.   Ankylosing spondylitis ([place])    Bilateral pulmonary embolism ([place])    a. 08/2014 - started on Coumadin. Retrievable IVC filter placed 08/27/14 due to RV strain and large clot burden.   CAD (coronary artery disease)    a. stenting of LCx 2013; b. STEMI 06/12/14 s/p PCI to LAD complicated by post cath shock requiring IABP; VT s/p DCCV, EF 20%; c. NSTEMI 06/26/14 treated medically.   Carotid  artery disease ([place])    a. s/p stenting.   Chronic systolic CHF (congestive heart failure) ([place])    Diabetes ([place])    Hemoptysis    a. 08/2014 possibly due to pulm infarct/PE.   Hyperlipidemia    Hypertension    Hypotension    Ischemic cardiomyopathy    a. echo 08/23/2014 EF <20%, dilated CM, mod MR/TR   Leukocytosis    Leukocytosis 09/10/2014   Nephrolithiasis    Pleural effusion on right 08/2014 - small   Reactive thrombocytosis 09/10/2014   Right leg DVT ([place])    a. 08/2014.   Ventricular tachycardia ([place])    a. 06/2014 at time of MI, s/p DCCV.   Current Outpatient Medications  Medication Sig Dispense Refill   acetaminophen (TYLENOL) 500 MG tablet Take 1,000 mg by mouth every 6 (six) hours as needed for moderate pain.      aspirin EC 81 MG tablet Take 1 tablet (81 mg total) by mouth daily. 90 tablet 3   clopidogrel (PLAVIX) 75 MG tablet Take 1 tablet (75 mg total) by mouth daily. 90 tablet 3   cyclobenzaprine (FLEXERIL) 5 MG tablet Take 1 tablet (5 mg total) by mouth 3 (three) times daily as needed for muscle spasms. 270 tablet 3   furosemide (LASIX) 20 MG tablet Take 1 tablet (20 mg total) by mouth daily. Take 20 mg daily; may take 40 mg if weight gain 120 tablet 3   pantoprazole (PROTONIX) 40 MG tablet Take 1 tablet (40 mg total) by mouth at bedtime. 90 tablet 3   potassium chloride SA (K-DUR,KLOR-CON) 20 MEQ tablet Every other day alternate 20 mEq (1 tablet) with 40 mEq (2 tablets) 135 tablet 3   rosuvastatin (CRESTOR) 10 MG tablet Take 1 tablet (10 mg total) by mouth at bedtime. 90 tablet 3   sildenafil (VIAGRA) 100 MG tablet Take 1 tablet (100 mg total) by mouth daily as needed for erectile dysfunction. 10 tablet 3   traMADol (ULTRAM) 50 MG tablet Take 2 tablets (100 mg total) by mouth every 6 (six) hours as needed for moderate pain. (Patient taking differently: Take 50 mg by mouth every 6 (six) hours as needed for moderate pain. ) 120 tablet 3   traZODone  (DESYREL) 100 MG tablet Take 2 tablets (200 mg total) by mouth at bedtime. 90 tablet 3   warfarin (JANTOVEN) 6 MG tablet TAKE 1 TABLET DAILY EXCEPT TAKE 1 AND 1/2 TABLETS (9  MG) ON WEDNESDAY 100 tablet 3   cephALEXin (KEFLEX) 500 MG capsule Take 1 capsule (500 mg total) by mouth 3 (three) times daily. 21 capsule 0   No current facility-administered medications  for this encounter.    Vital Signs:  Temp: 97.5 Doppler Pressure: 82 Automatc BP:  95/65 (79) HR: 81 SPO2: 96% RA  Weight: 195.8 lb w/o eqt Last weight: 196.4 lbs  Wt Readings from Last 3 Encounters:  12/09/19 88.8 kg (195 lb 12.8 oz)  09/30/19 84.4 kg (186 lb)  09/14/19 89 kg (196 lb 3.2 oz)   Physical Exam: General:  NAD. Very fatigued HEENT: normal  Neck: supple. JVP not elevated.  Carotids 2+ bilat; no bruits. No lymphadenopathy or thryomegaly appreciated. Cor: LVAD hum.  Lungs: Clear. Abdomen: obese soft, nontender, non-distended. No hepatosplenomegaly. No bruits or masses. Good bowel sounds. Driveline site with mild erythmea. Anchor in place.  Extremities: no cyanosis, clubbing, rash. Warm no edema  Neuro: alert & oriented x 3. No focal deficits. Moves all 4 without problem     ASSESSMENT & PLAN:  1. Chronic systolic HF: Ischemic cardiomyopathy, EF 20% s/p HMII LVAD 09/20/13.   - Doing well. Now over 6 years out.  - has been stable NYHA I-II with VAD support but now much more fatigued - Volume status looks good.  - Refused transplant w/u. Now age prohibited  2. Fatigue/insomnia - increase trazodone to 200mg  qhs - if no improvement with treatment of inaomnia witl get sleep study  3. CAD s/p Anterior STEMI on 06/12/14 with stenting of LAD:  - No s/s ishcemia - Continue statin/ASA  4 VAD management:  - Had admit in 08/2017 for elevated LDH.  - LDH stabilized 300-380. 352 today - Hgb 14.0 - Off asa. On coumadin.  - INR 1.9.  Will try to keep INR 2.3- 2.8. Discussed dosing with PharmD personally. -  Continue DAPT - Mile DL erythema. Start Keflex - VAD interrogated personally. Parameters stable.   5. Ankylosing spondylitis: Off NSAIDS. -  Follows with rheumatology. Uses tramadol. Avoids NSAIDs as much as possible. -  Pain reasonable well controlled  - No change  5. HTN:  - MAPs ok    6. Kidney Stones  - CT with renal calculi bilaterally. Asymptomatic. No intervention needed at this time.  Now followed by Dr Tresa Moore at [facility]. - No change  Total time spent 35 minutes. Over half that time spent discussing above.    Scott Bickers, MD  12/13/2019

## 2019-12-14 ENCOUNTER — Telehealth (HOSPITAL_COMMUNITY): Payer: Self-pay | Admitting: Licensed Clinical Social Worker

## 2019-12-14 NOTE — Telephone Encounter (Signed)
CSW contacted patient to inform of LVAD Support Group meeting. Patient states he plans to attend and looking forward to seeing everyone.  Jackie Tannah Dreyfuss, LCSW, CCSW-MCS [phone]  

## 2019-12-17 NOTE — Telephone Encounter (Signed)
Opened in error

## 2019-12-18 ENCOUNTER — Other Ambulatory Visit (HOSPITAL_COMMUNITY): Payer: Self-pay | Admitting: *Deleted

## 2019-12-18 DIAGNOSIS — Z95811 Presence of heart assist device: Secondary | ICD-10-CM

## 2019-12-18 DIAGNOSIS — Z7901 Long term (current) use of anticoagulants: Secondary | ICD-10-CM

## 2019-12-23 ENCOUNTER — Ambulatory Visit (HOSPITAL_COMMUNITY)
Admission: RE | Admit: 2019-12-23 | Discharge: 2019-12-23 | Disposition: A | Discharge status: Home / Self Care | Payer: Medicare Other | Source: Ambulatory Visit | Attending: Internal Medicine | Admitting: Internal Medicine

## 2019-12-23 ENCOUNTER — Ambulatory Visit (HOSPITAL_COMMUNITY): Payer: Self-pay | Admitting: Pharmacist

## 2019-12-23 ENCOUNTER — Other Ambulatory Visit: Payer: Self-pay

## 2019-12-23 ENCOUNTER — Telehealth (HOSPITAL_COMMUNITY): Payer: Self-pay | Admitting: *Deleted

## 2019-12-23 DIAGNOSIS — I480 Paroxysmal atrial fibrillation: Secondary | ICD-10-CM

## 2019-12-23 DIAGNOSIS — I5020 Unspecified systolic (congestive) heart failure: Secondary | ICD-10-CM

## 2019-12-23 DIAGNOSIS — Z95811 Presence of heart assist device: Secondary | ICD-10-CM | POA: Insufficient documentation

## 2019-12-23 DIAGNOSIS — I5023 Acute on chronic systolic (congestive) heart failure: Secondary | ICD-10-CM

## 2019-12-23 DIAGNOSIS — R0609 Other forms of dyspnea: Secondary | ICD-10-CM

## 2019-12-23 DIAGNOSIS — Z7901 Long term (current) use of anticoagulants: Secondary | ICD-10-CM | POA: Diagnosis not present

## 2019-12-23 DIAGNOSIS — Z79899 Other long term (current) drug therapy: Secondary | ICD-10-CM

## 2019-12-23 DIAGNOSIS — I5041 Acute combined systolic (congestive) and diastolic (congestive) heart failure: Secondary | ICD-10-CM

## 2019-12-23 DIAGNOSIS — G479 Sleep disorder, unspecified: Secondary | ICD-10-CM

## 2019-12-23 DIAGNOSIS — Y838 Other surgical procedures as the cause of abnormal reaction of the patient, or of later complication, without mention of misadventure at the time of the procedure: Secondary | ICD-10-CM | POA: Insufficient documentation

## 2019-12-23 DIAGNOSIS — T827XXA Infection and inflammatory reaction due to other cardiac and vascular devices, implants and grafts, initial encounter: Secondary | ICD-10-CM | POA: Diagnosis not present

## 2019-12-23 DIAGNOSIS — I5022 Chronic systolic (congestive) heart failure: Secondary | ICD-10-CM

## 2019-12-23 LAB — PROTIME-INR
INR: 2.3 — ABNORMAL HIGH (ref 0.8–1.2)
Prothrombin Time: 24.4 seconds — ABNORMAL HIGH (ref 11.4–15.2)

## 2019-12-23 LAB — LACTATE DEHYDROGENASE: LDH: 399 U/L — ABNORMAL HIGH (ref 98–192)

## 2019-12-23 MED ORDER — POTASSIUM CHLORIDE CRYS ER 20 MEQ PO TBCR: EXTENDED_RELEASE_TABLET | ORAL | 3 refills | Status: DC

## 2019-12-23 MED ORDER — CEPHALEXIN 500 MG PO CAPS: 500.0000 mg | ORAL_CAPSULE | Freq: Three times a day (TID) | ORAL | 0 refills | Status: DC

## 2019-12-23 MED ORDER — FUROSEMIDE 20 MG PO TABS: 20.0000 mg | ORAL_TABLET | Freq: Every day | ORAL | 3 refills | Status: DC

## 2019-12-23 NOTE — Telephone Encounter (Signed)
Called patient and [name] per Dr. [name] with following instructions:  1. Re-start Keflex 500 mg three times daily and continue until drive line culture results from today. Rx sent to local pharmacy.  2. Daily dressing changes and pack tunneled area (under drive line) with 1/2 inch Iodoform gauze. She may obtain at local drug store.   3. If she needs demonstration on packing please return to clinic for demonstration and will provide necessary supplies.  4. Will plan on doing wound checks every two weeks until site heals.  [name] verbalized understanding of above and feels comfortable performing dressing changes with packing. She will call VAD coordinator if any questions, concerns, or comments.  Zada Girt RN, [place] Coordinator [phone]

## 2019-12-23 NOTE — Progress Notes (Signed)
Pt presents for labs and drive line exit dressing change with wife.   Patient completed course of Keflex on 12/18/19. Wife reports initial decrease in drainage, but "seems to have increased over last few days". She has been changing dressing every three days and does report "sometimes" the drainage has foul odor. She does report skin rash has cleared up since last visit. Last dressing change was performed 3 days ago (Sunday).  Exit Site Care: Existing VAD dressing removed and site care performed using sterile technique. Dressing with small smount dark bloody drainage with slight foul odor; no hypergranulated tissue noted. Exit site with some redness around site, no tenderness noted; rash much improved. Drive line exit site cleaned with sterile saline wipes x 2, allowed to dry. Wound culture obtained. Very tight tunneling of 7 cm on bottom side of drive line. Covered with guaze dressing and silk tape. Pt does not tolerate silver strip, CHG sponge, or skin prep. Asked wife to perform every other day dressing changes for now. Will review with Dr. Darcey Nora and call with any changes/additional instructions.  Provided patient with 7 daly dressing kits and 7 anchors for home use.     Zada Girt RN, [place] Coordinator [phone]

## 2019-12-23 NOTE — Progress Notes (Signed)
LVAD INR 

## 2019-12-23 NOTE — Addendum Note (Signed)
Encounter addended by: Lezlie Octave, RN on: 12/23/2019 11:35 AM  Actions taken: Visit diagnoses modified, Pharmacy for encounter modified, Order list changed, Diagnosis association updated, Clinical Note Signed

## 2019-12-28 LAB — AEROBIC/ANAEROBIC CULTURE W GRAM STAIN (SURGICAL/DEEP WOUND): Gram Stain: NONE SEEN

## 2020-01-01 ENCOUNTER — Other Ambulatory Visit (HOSPITAL_COMMUNITY): Payer: Self-pay | Admitting: *Deleted

## 2020-01-01 DIAGNOSIS — Z95811 Presence of heart assist device: Secondary | ICD-10-CM

## 2020-01-01 DIAGNOSIS — Z7901 Long term (current) use of anticoagulants: Secondary | ICD-10-CM

## 2020-01-06 ENCOUNTER — Ambulatory Visit (HOSPITAL_COMMUNITY)
Admission: RE | Admit: 2020-01-06 | Discharge: 2020-01-06 | Disposition: A | Discharge status: Home / Self Care | Payer: Medicare Other | Source: Ambulatory Visit | Attending: Internal Medicine | Admitting: Internal Medicine

## 2020-01-06 ENCOUNTER — Ambulatory Visit (HOSPITAL_COMMUNITY): Payer: Self-pay | Admitting: Pharmacist

## 2020-01-06 ENCOUNTER — Other Ambulatory Visit: Payer: Self-pay

## 2020-01-06 DIAGNOSIS — T827XXA Infection and inflammatory reaction due to other cardiac and vascular devices, implants and grafts, initial encounter: Secondary | ICD-10-CM | POA: Insufficient documentation

## 2020-01-06 DIAGNOSIS — Z95811 Presence of heart assist device: Secondary | ICD-10-CM | POA: Insufficient documentation

## 2020-01-06 DIAGNOSIS — R0609 Other forms of dyspnea: Secondary | ICD-10-CM

## 2020-01-06 DIAGNOSIS — R0600 Dyspnea, unspecified: Secondary | ICD-10-CM | POA: Insufficient documentation

## 2020-01-06 DIAGNOSIS — Z4801 Encounter for change or removal of surgical wound dressing: Secondary | ICD-10-CM | POA: Insufficient documentation

## 2020-01-06 DIAGNOSIS — Z7901 Long term (current) use of anticoagulants: Secondary | ICD-10-CM | POA: Diagnosis not present

## 2020-01-06 LAB — LACTATE DEHYDROGENASE: LDH: 368 U/L — ABNORMAL HIGH (ref 98–192)

## 2020-01-06 LAB — PROTIME-INR
INR: 2.2 — ABNORMAL HIGH (ref 0.8–1.2)
Prothrombin Time: 23.9 seconds — ABNORMAL HIGH (ref 11.4–15.2)

## 2020-01-06 MED ORDER — CEPHALEXIN 500 MG PO CAPS: 500.0000 mg | ORAL_CAPSULE | Freq: Three times a day (TID) | ORAL | 4 refills | Status: DC

## 2020-01-06 NOTE — Progress Notes (Signed)
Pt presents for labs and drive line exit dressing change with wife.   Patient completed course of Keflex on 01/02/20. Wife reports drainage has lessened; minimal bloody drainage at times. She does report skin rash has cleared up since last visit. Last dressing change was performed on Monday.  Exit Site Care: Existing VAD dressing removed and site care performed using sterile technique. Dressing with small smount dark bloody drainage; no foul odor; no hypergranulated tissue noted. Exit site with no redness around site, no tenderness noted; rash much improved. Drive line exit site cleaned with sterile saline wipes x 2, allowed to dry. Packed site with Calcium Alginate strip per Dr [name]. Very tight tunneling of 4 cm on bottom side of drive line. Covered with guaze dressing and silk tape. Pt does not tolerate silver strip, CHG sponge, or skin prep. Asked wife to perform every other day dressing changes for now.  Provided patient with 14 daily dressing kits, calcium alginate sheets, sterile cups, and sterile scissors for home use.     Reviewed the above with Dr [name]. Will continue Keflex 500 mg TID as this time.   Pt to return to clinic in 2 weeks for labs and dressing change.   Emerson Monte RN [place] Coordinator  Office: [phone]  24/7 Pager: [phone]

## 2020-01-06 NOTE — Addendum Note (Signed)
Encounter addended by: Mertha Baars, RN on: 01/06/2020 1:14 PM  Actions taken: Order list changed, Diagnosis association updated, Clinical Note Signed

## 2020-01-06 NOTE — Progress Notes (Signed)
LVAD INR 

## 2020-01-15 ENCOUNTER — Other Ambulatory Visit (HOSPITAL_COMMUNITY): Payer: Self-pay | Admitting: Unknown Physician Specialty

## 2020-01-15 DIAGNOSIS — Z7901 Long term (current) use of anticoagulants: Secondary | ICD-10-CM

## 2020-01-15 DIAGNOSIS — Z95811 Presence of heart assist device: Secondary | ICD-10-CM

## 2020-01-19 ENCOUNTER — Other Ambulatory Visit (HOSPITAL_COMMUNITY): Payer: Self-pay | Admitting: *Deleted

## 2020-01-19 DIAGNOSIS — Z95811 Presence of heart assist device: Secondary | ICD-10-CM

## 2020-01-19 DIAGNOSIS — Z7901 Long term (current) use of anticoagulants: Secondary | ICD-10-CM

## 2020-01-20 ENCOUNTER — Ambulatory Visit (HOSPITAL_COMMUNITY): Payer: Self-pay | Admitting: Pharmacist

## 2020-01-20 ENCOUNTER — Ambulatory Visit (HOSPITAL_COMMUNITY)
Admission: RE | Admit: 2020-01-20 | Discharge: 2020-01-20 | Disposition: A | Discharge status: Home / Self Care | Payer: Medicare Other | Source: Ambulatory Visit | Attending: Internal Medicine | Admitting: Internal Medicine

## 2020-01-20 ENCOUNTER — Other Ambulatory Visit: Payer: Self-pay

## 2020-01-20 DIAGNOSIS — Z95811 Presence of heart assist device: Secondary | ICD-10-CM | POA: Insufficient documentation

## 2020-01-20 DIAGNOSIS — Z7901 Long term (current) use of anticoagulants: Secondary | ICD-10-CM | POA: Insufficient documentation

## 2020-01-20 DIAGNOSIS — Z4801 Encounter for change or removal of surgical wound dressing: Secondary | ICD-10-CM | POA: Insufficient documentation

## 2020-01-20 LAB — PROTIME-INR
INR: 2.3 — ABNORMAL HIGH (ref 0.8–1.2)
Prothrombin Time: 24.3 seconds — ABNORMAL HIGH (ref 11.4–15.2)

## 2020-01-20 LAB — LACTATE DEHYDROGENASE: LDH: 311 U/L — ABNORMAL HIGH (ref 98–192)

## 2020-01-20 NOTE — Progress Notes (Signed)
Pt presents for labs and drive line exit dressing change with wife.   Patient currently taking Keflex tid. Performing dressing changes every other day.  Exit Site Care: Existing VAD dressing removed and site care performed using sterile technique. Dressing with scant amount brown/bloody drainage; no foul odor; small area of hypergranulated tissue noted - silver nitrate used on this area. Exit site with no redness around site, no tenderness noted; no rash. Drive line exit site cleaned with sterile saline wipes x 2, allowed to dry. Packed site with Calcium Alginate strip per Dr [name]. Very tight tunneling of 5 cm on bottom side of drive line. Covered with gauze dressing and silk tape. Pt does not tolerate silver strip, CHG sponge, or skin prep. Asked wife to perform every other day dressing changes for now. Patient has sufficient dressing supplies at home.  Pt to return to clinic in 2 weeks for labs, wife would for [place] to assess the driveline dressing at this time.   [name] RN [place] Coordinator  Office: [phone]  24/7 Pager: [phone]

## 2020-01-20 NOTE — Progress Notes (Signed)
LVAD INR 

## 2020-01-25 ENCOUNTER — Telehealth (HOSPITAL_COMMUNITY): Payer: Self-pay | Admitting: *Deleted

## 2020-01-25 ENCOUNTER — Other Ambulatory Visit (HOSPITAL_COMMUNITY): Payer: Self-pay | Admitting: *Deleted

## 2020-01-25 DIAGNOSIS — Z7901 Long term (current) use of anticoagulants: Secondary | ICD-10-CM

## 2020-01-25 DIAGNOSIS — Z95811 Presence of heart assist device: Secondary | ICD-10-CM

## 2020-01-25 NOTE — Telephone Encounter (Signed)
Pt's wife called to report the calcium alginate packing "broke off" during one of his home dressing changes and she has been unable "to get it out". He continues to have intermittent bloody drainage from site. Advised she can come tomorrow for dressing change in [facility] or wait until Wednesday when Dr. Darcey Nora returns for him to evaluate. She prefers tomorrow. Appt made; [name] verbalized understanding of same.  Zada Girt RN, [place] Coordinator [phone]

## 2020-01-26 ENCOUNTER — Other Ambulatory Visit: Payer: Self-pay

## 2020-01-26 ENCOUNTER — Ambulatory Visit (HOSPITAL_COMMUNITY)
Admission: RE | Admit: 2020-01-26 | Discharge: 2020-01-26 | Disposition: A | Discharge status: Home / Self Care | Payer: Medicare Other | Source: Ambulatory Visit | Attending: Cardiology | Admitting: Cardiology

## 2020-01-26 ENCOUNTER — Ambulatory Visit (HOSPITAL_COMMUNITY): Payer: Self-pay | Admitting: Pharmacist

## 2020-01-26 DIAGNOSIS — Z7901 Long term (current) use of anticoagulants: Secondary | ICD-10-CM | POA: Insufficient documentation

## 2020-01-26 DIAGNOSIS — Z95811 Presence of heart assist device: Secondary | ICD-10-CM | POA: Diagnosis not present

## 2020-01-26 LAB — LACTATE DEHYDROGENASE: LDH: 324 U/L — ABNORMAL HIGH (ref 98–192)

## 2020-01-26 LAB — PROTIME-INR
INR: 2.5 — ABNORMAL HIGH (ref 0.8–1.2)
Prothrombin Time: 26.1 seconds — ABNORMAL HIGH (ref 11.4–15.2)

## 2020-01-26 NOTE — Progress Notes (Signed)
Pt presents for labs and drive line exit dressing change with wife. Wife reports she removed partial amount of Calcium Alginate strip and is worried there may be residual in DL site she has been unable to remove.    Exit Site Care: Existing VAD dressing removed and site care performed using sterile technique. Dressing with scant amount brown/bloody drainage; no foul odor. Exit site with no redness around site, no tenderness noted; no rash, no hypergranulated tissue noted around site. Unable to retrieve any Calcium Alginate from site. Very tight tunnel of 4.5 cm. Drive line exit site cleaned with sterile saline wipes x 2, allowed to dry. Lightly packed with Iodoform strip.  Asked wife to perform every other day dressing changes for now. Patient has plain 1/4 inch packing at home that she will use. Provided patient with 14 daily kits for home use.   Audry Riles, PharmD will call you with INR results and warfarin dosing. Return for INR per her instructions.    Zada Girt RN [place] Coordinator  Office: [phone]  24/7 Pager: [phone]

## 2020-01-26 NOTE — Progress Notes (Signed)
LVAD INR 

## 2020-02-03 ENCOUNTER — Other Ambulatory Visit (HOSPITAL_COMMUNITY): Payer: Medicare Other

## 2020-02-04 ENCOUNTER — Other Ambulatory Visit (HOSPITAL_COMMUNITY): Payer: Self-pay | Admitting: *Deleted

## 2020-02-04 DIAGNOSIS — Z7901 Long term (current) use of anticoagulants: Secondary | ICD-10-CM

## 2020-02-04 DIAGNOSIS — Z95811 Presence of heart assist device: Secondary | ICD-10-CM

## 2020-02-10 ENCOUNTER — Ambulatory Visit (HOSPITAL_COMMUNITY): Payer: Self-pay | Admitting: Pharmacist

## 2020-02-10 ENCOUNTER — Other Ambulatory Visit: Payer: Self-pay

## 2020-02-10 ENCOUNTER — Ambulatory Visit (HOSPITAL_COMMUNITY)
Admission: RE | Admit: 2020-02-10 | Discharge: 2020-02-10 | Disposition: A | Discharge status: Home / Self Care | Payer: Medicare Other | Source: Ambulatory Visit | Attending: Internal Medicine | Admitting: Internal Medicine

## 2020-02-10 ENCOUNTER — Encounter (HOSPITAL_COMMUNITY): Payer: Medicare Other

## 2020-02-10 DIAGNOSIS — Z7901 Long term (current) use of anticoagulants: Secondary | ICD-10-CM | POA: Diagnosis not present

## 2020-02-10 DIAGNOSIS — Z95811 Presence of heart assist device: Secondary | ICD-10-CM | POA: Diagnosis not present

## 2020-02-10 LAB — PROTIME-INR
INR: 2.1 — ABNORMAL HIGH (ref 0.8–1.2)
Prothrombin Time: 22.4 seconds — ABNORMAL HIGH (ref 11.4–15.2)

## 2020-02-10 LAB — LACTATE DEHYDROGENASE: LDH: 329 U/L — ABNORMAL HIGH (ref 98–192)

## 2020-02-10 NOTE — Progress Notes (Signed)
LVAD INR 

## 2020-02-12 DIAGNOSIS — Z23 Encounter for immunization: Secondary | ICD-10-CM | POA: Diagnosis not present

## 2020-02-17 ENCOUNTER — Other Ambulatory Visit (HOSPITAL_COMMUNITY): Payer: Self-pay | Admitting: *Deleted

## 2020-02-17 DIAGNOSIS — Z95811 Presence of heart assist device: Secondary | ICD-10-CM

## 2020-02-17 DIAGNOSIS — Z7901 Long term (current) use of anticoagulants: Secondary | ICD-10-CM

## 2020-02-24 ENCOUNTER — Other Ambulatory Visit (HOSPITAL_COMMUNITY): Payer: Medicare Other

## 2020-02-29 ENCOUNTER — Other Ambulatory Visit (HOSPITAL_COMMUNITY): Payer: Self-pay | Admitting: Unknown Physician Specialty

## 2020-02-29 DIAGNOSIS — Z95811 Presence of heart assist device: Secondary | ICD-10-CM

## 2020-02-29 DIAGNOSIS — Z7901 Long term (current) use of anticoagulants: Secondary | ICD-10-CM

## 2020-03-02 ENCOUNTER — Ambulatory Visit (HOSPITAL_COMMUNITY)
Admission: RE | Admit: 2020-03-02 | Discharge: 2020-03-02 | Disposition: A | Discharge status: Home / Self Care | Payer: Medicare Other | Source: Ambulatory Visit | Attending: Cardiology | Admitting: Cardiology

## 2020-03-02 ENCOUNTER — Other Ambulatory Visit (HOSPITAL_COMMUNITY): Payer: Self-pay | Admitting: Unknown Physician Specialty

## 2020-03-02 ENCOUNTER — Other Ambulatory Visit: Payer: Self-pay

## 2020-03-02 ENCOUNTER — Ambulatory Visit (HOSPITAL_COMMUNITY): Payer: Self-pay | Admitting: Pharmacist

## 2020-03-02 VITALS — BP 94/0 | Ht 68.0 in | Wt 196.0 lb

## 2020-03-02 DIAGNOSIS — Z955 Presence of coronary angioplasty implant and graft: Secondary | ICD-10-CM | POA: Diagnosis not present

## 2020-03-02 DIAGNOSIS — Z79899 Other long term (current) drug therapy: Secondary | ICD-10-CM | POA: Insufficient documentation

## 2020-03-02 DIAGNOSIS — I252 Old myocardial infarction: Secondary | ICD-10-CM | POA: Insufficient documentation

## 2020-03-02 DIAGNOSIS — Z7982 Long term (current) use of aspirin: Secondary | ICD-10-CM | POA: Insufficient documentation

## 2020-03-02 DIAGNOSIS — I110 Hypertensive heart disease with heart failure: Secondary | ICD-10-CM | POA: Insufficient documentation

## 2020-03-02 DIAGNOSIS — Z7901 Long term (current) use of anticoagulants: Secondary | ICD-10-CM | POA: Diagnosis not present

## 2020-03-02 DIAGNOSIS — E119 Type 2 diabetes mellitus without complications: Secondary | ICD-10-CM | POA: Insufficient documentation

## 2020-03-02 DIAGNOSIS — M459 Ankylosing spondylitis of unspecified sites in spine: Secondary | ICD-10-CM | POA: Insufficient documentation

## 2020-03-02 DIAGNOSIS — I255 Ischemic cardiomyopathy: Secondary | ICD-10-CM | POA: Diagnosis not present

## 2020-03-02 DIAGNOSIS — G479 Sleep disorder, unspecified: Secondary | ICD-10-CM | POA: Diagnosis not present

## 2020-03-02 DIAGNOSIS — Z86711 Personal history of pulmonary embolism: Secondary | ICD-10-CM | POA: Insufficient documentation

## 2020-03-02 DIAGNOSIS — Z86718 Personal history of other venous thrombosis and embolism: Secondary | ICD-10-CM | POA: Diagnosis not present

## 2020-03-02 DIAGNOSIS — F329 Major depressive disorder, single episode, unspecified: Secondary | ICD-10-CM | POA: Insufficient documentation

## 2020-03-02 DIAGNOSIS — G4700 Insomnia, unspecified: Secondary | ICD-10-CM | POA: Insufficient documentation

## 2020-03-02 DIAGNOSIS — Z95811 Presence of heart assist device: Secondary | ICD-10-CM | POA: Diagnosis not present

## 2020-03-02 DIAGNOSIS — E785 Hyperlipidemia, unspecified: Secondary | ICD-10-CM | POA: Diagnosis not present

## 2020-03-02 DIAGNOSIS — N200 Calculus of kidney: Secondary | ICD-10-CM | POA: Insufficient documentation

## 2020-03-02 DIAGNOSIS — Z87891 Personal history of nicotine dependence: Secondary | ICD-10-CM | POA: Diagnosis not present

## 2020-03-02 DIAGNOSIS — I2510 Atherosclerotic heart disease of native coronary artery without angina pectoris: Secondary | ICD-10-CM

## 2020-03-02 DIAGNOSIS — I5022 Chronic systolic (congestive) heart failure: Secondary | ICD-10-CM | POA: Diagnosis not present

## 2020-03-02 DIAGNOSIS — Z7902 Long term (current) use of antithrombotics/antiplatelets: Secondary | ICD-10-CM | POA: Insufficient documentation

## 2020-03-02 DIAGNOSIS — T827XXA Infection and inflammatory reaction due to other cardiac and vascular devices, implants and grafts, initial encounter: Secondary | ICD-10-CM

## 2020-03-02 LAB — COMPREHENSIVE METABOLIC PANEL
ALT: 25 U/L (ref 0–44)
AST: 33 U/L (ref 15–41)
Albumin: 4 g/dL (ref 3.5–5.0)
Alkaline Phosphatase: 104 U/L (ref 38–126)
Anion gap: 9 (ref 5–15)
BUN: 15 mg/dL (ref 8–23)
CO2: 25 mmol/L (ref 22–32)
Calcium: 9.2 mg/dL (ref 8.9–10.3)
Chloride: 102 mmol/L (ref 98–111)
Creatinine, Ser: 1.05 mg/dL (ref 0.61–1.24)
GFR calc Af Amer: >60 mL/min (ref >60)
GFR calc non Af Amer: >60 mL/min (ref >60)
Glucose, Bld: 89 mg/dL (ref 70–99)
Potassium: 4.2 mmol/L (ref 3.5–5.1)
Sodium: 136 mmol/L (ref 135–145)
Total Bilirubin: 1 mg/dL (ref 0.3–1.2)
Total Protein: 8.2 g/dL — ABNORMAL HIGH (ref 6.5–8.1)

## 2020-03-02 LAB — PREALBUMIN: Prealbumin: 19.5 mg/dL (ref 18–38)

## 2020-03-02 LAB — PROTIME-INR
INR: 2.5 — ABNORMAL HIGH (ref 0.8–1.2)
Prothrombin Time: 25.9 seconds — ABNORMAL HIGH (ref 11.4–15.2)

## 2020-03-02 LAB — CBC
HCT: 43.9 % (ref 39.0–52.0)
Hemoglobin: 13.9 g/dL (ref 13.0–17.0)
MCH: 29.7 pg (ref 26.0–34.0)
MCHC: 31.7 g/dL (ref 30.0–36.0)
MCV: 93.8 fL (ref 80.0–100.0)
Platelets: 315 10*3/uL (ref 150–400)
RBC: 4.68 MIL/uL (ref 4.22–5.81)
RDW: 15 % (ref 11.5–15.5)
WBC: 9.3 10*3/uL (ref 4.0–10.5)
nRBC: 0 % (ref 0.0–0.2)

## 2020-03-02 LAB — LACTATE DEHYDROGENASE: LDH: 336 U/L — ABNORMAL HIGH (ref 98–192)

## 2020-03-02 NOTE — Progress Notes (Signed)
Patient presents for 2 month follow up in [facility] today with wife. Reports no problems with VAD equipment and reports DL drainage has decreased and has no bleeding since Thursday.   Wife reports DL site drainage has decreased and she advanced to every other day. Wife reports no further bleeding. Dr. Haroldine Laws informed wife/pt to continue Keflex until driveline is incorporated again. [name] is no longer packing the site.  Wife and patient report VAD flows have been above baseline at times at 5 - 7; powers are sometimes elevated to same range. See below.  Pt continues to complain about lack of sleep. Pt is asking if he can stop Crestor for a bit as he read that it can cause sleep disturbance. Dr. Haroldine Laws informed pt that he can stop Crestor for up to to a month if he would like to trial and see if his sleep improves.  Vital Signs:  Temp: 98.4 Doppler Pressure: 94 Automatc BP:  107/60 (76) HR: 104 SPO2: 95% RA  Weight: 196 lb w/o eqt Last weight: 195.8 lbs  VAD Indication: Destination Therapy patient choice    VAD interrogation & Equipment Management: Speed: 9200 Flow: 5.0 Power:  5.4w    PI: 6.2  Alarms: none Events: rare  Fixed speed 9200 Low speed limit: 8600  Primary controller back-up battery expiring in 7 months Back up controller back-up battery expiring in 7 months  Annual Equipment Maintenance on UBC/PM was performed today on 12/09/19.  I reviewed the LVAD parameters from today and compared the results to the patient's prior recorded data. LVAD interrogation was NEGATIVE for significant power changes, NEGATIVE for clinical alarms and STABLE for PI events/speed drops. No programming changes were made and pump is functioning within specified parameters. Pt is performing daily controller and system monitor self tests along with completing weekly and monthly maintenance for LVAD equipment.  LVAD equipment check completed and is in good working order. Back-up equipment  present.   Exit Site Care: Drive line is being maintained weekly  by his wife, [name].Dressing dry and intact. Stabilization device present and accurately applied. Pt denies fever or chills. 15 anchors, 28 daily dressing kits provided for home use.    Significant Events on VAD Support:  02/21/17> lap chole  Device:none   BP & Labs:  MAP 94 - Doppler is reflecting Modified systolic  Hgb 48.5 - No S/S of bleeding. Specifically denies melena/BRBPR or nosebleeds.  LDH 336 and is within established baseline of 250- 360. Pt denies tea-colored urine. No power elevations noted on interrogation. We will continue to check this with his INR.   5.5 year [place] follow up completed including:  Quality of Life and KCCQ-12. Pt declined Neurocognitive trail making due to "visual problems."  Pt completed 600 feet during 6 minute walk.  [facility] Cardiomyopathy Questionnaire  KCCQ-12 03/02/2020  1 a. Ability to shower/bathe Quite a bit limited  1 b. Ability to walk 1 block Moderately limited  1 c. Ability to hurry/jog Extremely limited  2. Edema feet/ankles/legs Never over the past 2 weeks  3. Limited by fatigue All of the time  4. Limited by dyspnea All of the time  5. Sitting up / on 3+ pillows Never over the past 2 weeks  6. Limited enjoyment of life Limited quite a bit  7. Rest of life w/ symptoms Mostly dissatisfied  8 a. Participation in hobbies Limited quite a bit  8 b. Participation in chores Limited quite a bit  8 c. Visiting family/friends  Moderately limited      Patient Instructions:  1. No changes in medications 2. Return to [facility] in 3 mos  [name] RN [place] Coordinator  Office: [phone]  24/7 Pager: [phone]

## 2020-03-02 NOTE — Progress Notes (Signed)
LVAD INR 

## 2020-03-02 NOTE — Progress Notes (Signed)
Patient ID: Scott [name], male   DOB: May 22, 1946, 74 y.o.   MRN: [id number]     Primary Cardiologist: Dr Haroldine Laws INR : [facility] [place]  HPI: Scott [name] is a 74 y/o male with h/o CAD with ankylosing spondylitis, DM2, carotid stenting, severe ischemic CM s/p anterior STEM with VT arrest in 12/15, large PE with pulmonary infarct in 2/15 s/p IVC filter and systolic HF with EF 54%.  He underwent HM II LVAD placement on 09/20/13. He had a prolonged hospital course due to recurrent chest bleeding, severe CO2 retention and RV failure.  In 8/18 admitted with acute gallstone pancreatitis. Underwent lap chole 02/21/17.   Admitted 2/18 with elevated LDH. He was started on bivalirudin. LDH went down [phone].   He presents today for routine visit. Here with his weight. Overall stable. Struggles with his arthritis pain. Has had one episode of bleeding from driveline site but overall it has done well. He continues on Keflex. Having trouble sleeping and thinks it may be due to Crestor. Depressed over Covid and the way the country is going. Denies orthopnea or PND. No fevers, or chills.. No bleeding, melena or neuro symptoms. No VAD alarms. Taking all meds as prescribed. Weight stable 188.    VAD Indication: Destination Therapy patient choice    VAD interrogation & Equipment Management: Speed: 9200 Flow: 5.0 Power:  5.4w PI: 6.2  Alarms: none Events: rare  Fixed speed 9200 Low speed limit: 8600  Primary controller back-up battery expiring in 7 months Back up controller back-up battery expiring in 7 months  Annual Equipment Maintenance on UBC/PM was performed today on 12/09/19.   SH:  Social History   Socioeconomic History   Marital status: Married    Spouse name: Not on file   Number of children: Not on file   Years of education: Not on file   Highest education level: Not on file  Occupational History   Occupation: retired  Tobacco Use   Smoking status: Former Smoker     Packs/day: 0.30    Years: 0.00    Pack years: 0.00    Types: Cigarettes    Quit date: 06/12/1969    Years since quitting: 50.7   Smokeless tobacco: Never Used   Tobacco comment: pt smoked while in [employer] 2-3 cig x 6 months.  Vaping Use   Vaping Use: Former  Substance and Sexual Activity   Alcohol use: No    Alcohol/week: 0.0 standard drinks   Drug use: No   Sexual activity: Not Currently  Other Topics Concern   Not on file  Social History Narrative   Not on file   Social Determinants of Health   Financial Resource Strain:    Difficulty of Paying Living Expenses: Not on file  Food Insecurity:    Worried About Charity fundraiser in the Last Year: Not on file   [employer] of Food in the Last Year: Not on file  Transportation Needs:    Lack of Transportation (Medical): Not on file   Lack of Transportation (Non-Medical): Not on file  Physical Activity:    Days of Exercise per Week: Not on file   Minutes of Exercise per Session: Not on file  Stress:    Feeling of Stress : Not on file  Social Connections:    Frequency of Communication with Friends and Family: Not on file   Frequency of Social Gatherings with Friends and Family: Not on file   Attends Religious Services: Not on file  Active Member of Clubs or Organizations: Not on file   Attends Archivist Meetings: Not on file   Marital Status: Not on file  Intimate Partner Violence:    Fear of Current or Ex-Partner: Not on file   Emotionally Abused: Not on file   Physically Abused: Not on file   Sexually Abused: Not on file    FH:  Family History  Problem Relation Age of Onset   Hypertension Father    Heart attack Father    Heart Problems Sister     Past Medical History:  Diagnosis Date   Acute on chronic respiratory failure ([place])    a. 08/2014 in setting of PE.   Ankylosing spondylitis ([place])    Bilateral pulmonary embolism ([place])    a. 08/2014 - started on Coumadin.  Retrievable IVC filter placed 08/27/14 due to RV strain and large clot burden.   CAD (coronary artery disease)    a. stenting of LCx 2013; b. STEMI 06/12/14 s/p PCI to LAD complicated by post cath shock requiring IABP; VT s/p DCCV, EF 20%; c. NSTEMI 06/26/14 treated medically.   Carotid artery disease ([place])    a. s/p stenting.   Chronic systolic CHF (congestive heart failure) ([place])    Diabetes ([place])    Hemoptysis    a. 08/2014 possibly due to pulm infarct/PE.   Hyperlipidemia    Hypertension    Hypotension    Ischemic cardiomyopathy    a. echo 08/23/2014 EF <20%, dilated CM, mod MR/TR   Leukocytosis    Leukocytosis 09/10/2014   Nephrolithiasis    Pleural effusion on right 08/2014 - small   Reactive thrombocytosis 09/10/2014   Right leg DVT ([place])    a. 08/2014.   Ventricular tachycardia ([place])    a. 06/2014 at time of MI, s/p DCCV.   Current Outpatient Medications  Medication Sig Dispense Refill   acetaminophen (TYLENOL) 500 MG tablet Take 1,000 mg by mouth every 6 (six) hours as needed for moderate pain.      aspirin EC 81 MG tablet Take 1 tablet (81 mg total) by mouth daily. 90 tablet 3   cephALEXin (KEFLEX) 500 MG capsule Take 1 capsule (500 mg total) by mouth 3 (three) times daily. 60 capsule 4   clopidogrel (PLAVIX) 75 MG tablet Take 1 tablet (75 mg total) by mouth daily. 90 tablet 3   cyclobenzaprine (FLEXERIL) 5 MG tablet Take 1 tablet (5 mg total) by mouth 3 (three) times daily as needed for muscle spasms. 270 tablet 3   furosemide (LASIX) 20 MG tablet Take 1 tablet (20 mg total) by mouth daily. Take 20 mg daily; may take 40 mg if weight gain 120 tablet 3   pantoprazole (PROTONIX) 40 MG tablet Take 1 tablet (40 mg total) by mouth at bedtime. 90 tablet 3   potassium chloride SA (KLOR-CON) 20 MEQ tablet Every other day alternate 20 mEq (1 tablet) with 40 mEq (2 tablets) 135 tablet 3   rosuvastatin (CRESTOR) 10 MG tablet Take 1 tablet (10 mg total) by mouth at  bedtime. 90 tablet 3   sildenafil (VIAGRA) 100 MG tablet Take 1 tablet (100 mg total) by mouth daily as needed for erectile dysfunction. 10 tablet 3   traMADol (ULTRAM) 50 MG tablet Take 2 tablets (100 mg total) by mouth every 6 (six) hours as needed for moderate pain. (Patient taking differently: Take 50 mg by mouth every 6 (six) hours as needed for moderate pain. ) 120 tablet 3  traZODone (DESYREL) 100 MG tablet Take 2 tablets (200 mg total) by mouth at bedtime. (Patient taking differently: Take 150 mg by mouth at bedtime. ) 90 tablet 3   warfarin (JANTOVEN) 6 MG tablet TAKE 1 TABLET DAILY EXCEPT TAKE 1 AND 1/2 TABLETS (9  MG) ON WEDNESDAY 100 tablet 3   No current facility-administered medications for this encounter.    Vital Signs:  Temp: 98.4 Doppler Pressure: 94 Automatc BP:  107/60 (76) HR: 104 SPO2: 95% RA  Weight: 196 lb w/o eqt Last weight: 195.8 lbs   Wt Readings from Last 3 Encounters:  03/02/20 88.9 kg (196 lb)  12/09/19 88.8 kg (195 lb 12.8 oz)  09/30/19 84.4 kg (186 lb)   Physical Exam: General:  NAD.  HEENT: normal  Neck: supple. JVP not elevated.  Carotids 2+ bilat; no bruits. No lymphadenopathy or thryomegaly appreciated. Cor: LVAD hum.  Lungs: Clear. Abdomen: soft, nontender, non-distended. No hepatosplenomegaly. No bruits or masses. Good bowel sounds. Driveline site clean. Anchor in place.  Extremities: no cyanosis, clubbing, rash. Warm no edema  Neuro: alert & oriented x 3. No focal deficits. Moves all 4 without problem    ASSESSMENT & PLAN:  1. Chronic systolic HF: Ischemic cardiomyopathy, EF 20% s/p HMII LVAD 09/20/13.   - Doing well. Now over 6 years out.  - Stable NYHA II. Volume status ok.  - Refused transplant w/u. Now age prohibited  2. Fatigue/insomnia - continue trazodone - wants to do a trial of holding crestor and see if that helps. Ok by me.   3. CAD s/p Anterior STEMI on 06/12/14 with stenting of LAD:  - No s/s ischemia  - Off  ASA. Holding statin to see if helps with insomnia for a few weeks.   4 VAD management:  - Had admit in 08/2017 for elevated LDH.  - LDH stabilized 300-380. [phone] today - Hgb 13.9 - Off asa. On coumadin.  - INR 2.5.  Will try to keep INR 2.3- 2.8. Discussed dosing with PharmD personally. - Continue Keflex for mild DL irritation - VAD interrogated personally. Parameters stable.   5. Ankylosing spondylitis: Off NSAIDS. -  Follows with rheumatology. Uses tramadol. Avoids NSAIDs as much as possible. -  Pain reasonably well controlled  - No change  5. HTN:  - Blood pressure well controlled. Continue current regimen.  6. Kidney Stones  - CT with renal calculi bilaterally. Asymptomatic. No intervention needed at this time.  Now followed by Dr Tresa Moore at [facility]. - No change  7. Depression - situational - consider SSRI  Total time spent 35 minutes. Over half that time spent discussing above.    [name], MD  03/02/2020

## 2020-03-09 ENCOUNTER — Other Ambulatory Visit (HOSPITAL_COMMUNITY): Payer: Self-pay | Admitting: *Deleted

## 2020-03-09 DIAGNOSIS — Z7901 Long term (current) use of anticoagulants: Secondary | ICD-10-CM

## 2020-03-09 DIAGNOSIS — Z95811 Presence of heart assist device: Secondary | ICD-10-CM

## 2020-03-11 DIAGNOSIS — Z23 Encounter for immunization: Secondary | ICD-10-CM | POA: Diagnosis not present

## 2020-03-16 ENCOUNTER — Other Ambulatory Visit: Payer: Self-pay

## 2020-03-16 ENCOUNTER — Ambulatory Visit (HOSPITAL_COMMUNITY): Payer: Self-pay | Admitting: Pharmacist

## 2020-03-16 ENCOUNTER — Ambulatory Visit (HOSPITAL_COMMUNITY)
Admission: RE | Admit: 2020-03-16 | Discharge: 2020-03-16 | Disposition: A | Discharge status: Home / Self Care | Payer: Medicare Other | Source: Ambulatory Visit | Attending: Cardiology | Admitting: Cardiology

## 2020-03-16 DIAGNOSIS — Z95811 Presence of heart assist device: Secondary | ICD-10-CM | POA: Diagnosis not present

## 2020-03-16 DIAGNOSIS — Z7901 Long term (current) use of anticoagulants: Secondary | ICD-10-CM | POA: Insufficient documentation

## 2020-03-16 LAB — PROTIME-INR
INR: 2.1 — ABNORMAL HIGH (ref 0.8–1.2)
Prothrombin Time: 22.9 seconds — ABNORMAL HIGH (ref 11.4–15.2)

## 2020-03-16 LAB — LACTATE DEHYDROGENASE: LDH: 296 U/L — ABNORMAL HIGH (ref 98–192)

## 2020-03-16 NOTE — Progress Notes (Signed)
LVAD INR 

## 2020-03-18 DIAGNOSIS — D1801 Hemangioma of skin and subcutaneous tissue: Secondary | ICD-10-CM | POA: Diagnosis not present

## 2020-03-18 DIAGNOSIS — S80812A Abrasion, left lower leg, initial encounter: Secondary | ICD-10-CM | POA: Diagnosis not present

## 2020-03-18 DIAGNOSIS — L219 Seborrheic dermatitis, unspecified: Secondary | ICD-10-CM | POA: Diagnosis not present

## 2020-03-23 ENCOUNTER — Other Ambulatory Visit (HOSPITAL_COMMUNITY): Payer: Self-pay | Admitting: *Deleted

## 2020-03-23 DIAGNOSIS — Z7901 Long term (current) use of anticoagulants: Secondary | ICD-10-CM

## 2020-03-23 DIAGNOSIS — Z95811 Presence of heart assist device: Secondary | ICD-10-CM

## 2020-03-31 ENCOUNTER — Ambulatory Visit (HOSPITAL_COMMUNITY): Payer: Self-pay | Admitting: Pharmacist

## 2020-03-31 ENCOUNTER — Other Ambulatory Visit: Payer: Self-pay

## 2020-03-31 ENCOUNTER — Ambulatory Visit (HOSPITAL_COMMUNITY)
Admission: RE | Admit: 2020-03-31 | Discharge: 2020-03-31 | Disposition: A | Discharge status: Home / Self Care | Payer: Medicare Other | Source: Ambulatory Visit | Attending: Cardiology | Admitting: Cardiology

## 2020-03-31 DIAGNOSIS — Z7901 Long term (current) use of anticoagulants: Secondary | ICD-10-CM

## 2020-03-31 DIAGNOSIS — Z95811 Presence of heart assist device: Secondary | ICD-10-CM | POA: Diagnosis not present

## 2020-03-31 LAB — LACTATE DEHYDROGENASE: LDH: 284 U/L — ABNORMAL HIGH (ref 98–192)

## 2020-03-31 LAB — PROTIME-INR
INR: 2.2 — ABNORMAL HIGH (ref 0.8–1.2)
Prothrombin Time: 24 seconds — ABNORMAL HIGH (ref 11.4–15.2)

## 2020-03-31 NOTE — Progress Notes (Signed)
LVAD INR 

## 2020-04-08 ENCOUNTER — Other Ambulatory Visit (HOSPITAL_COMMUNITY): Payer: Self-pay | Admitting: Unknown Physician Specialty

## 2020-04-08 DIAGNOSIS — Z95811 Presence of heart assist device: Secondary | ICD-10-CM

## 2020-04-08 DIAGNOSIS — Z7901 Long term (current) use of anticoagulants: Secondary | ICD-10-CM

## 2020-04-14 ENCOUNTER — Ambulatory Visit (HOSPITAL_COMMUNITY)
Admission: RE | Admit: 2020-04-14 | Discharge: 2020-04-14 | Disposition: A | Discharge status: Home / Self Care | Payer: Medicare Other | Source: Ambulatory Visit | Attending: Internal Medicine | Admitting: Internal Medicine

## 2020-04-14 ENCOUNTER — Encounter: Payer: Self-pay | Admitting: Unknown Physician Specialty

## 2020-04-14 ENCOUNTER — Ambulatory Visit (HOSPITAL_COMMUNITY): Payer: Self-pay | Admitting: Pharmacist

## 2020-04-14 ENCOUNTER — Other Ambulatory Visit: Payer: Self-pay

## 2020-04-14 DIAGNOSIS — Z95811 Presence of heart assist device: Secondary | ICD-10-CM | POA: Diagnosis not present

## 2020-04-14 DIAGNOSIS — Z7901 Long term (current) use of anticoagulants: Secondary | ICD-10-CM

## 2020-04-14 LAB — PROTIME-INR
INR: 2.1 — ABNORMAL HIGH (ref 0.8–1.2)
Prothrombin Time: 23 seconds — ABNORMAL HIGH (ref 11.4–15.2)

## 2020-04-14 LAB — LACTATE DEHYDROGENASE: LDH: 371 U/L — ABNORMAL HIGH (ref 98–192)

## 2020-04-14 NOTE — Progress Notes (Signed)
LVAD INR 

## 2020-04-15 ENCOUNTER — Telehealth (HOSPITAL_COMMUNITY): Payer: Self-pay | Admitting: Unknown Physician Specialty

## 2020-04-15 DIAGNOSIS — Z95811 Presence of heart assist device: Secondary | ICD-10-CM

## 2020-04-15 DIAGNOSIS — Z7901 Long term (current) use of anticoagulants: Secondary | ICD-10-CM

## 2020-04-15 NOTE — Addendum Note (Signed)
Addended by: [name] B on: 04/15/2020 09:47 AM   Modules accepted: Orders

## 2020-04-15 NOTE — Telephone Encounter (Signed)
Received call from pts wife this morning stating that the pt has increased drainage from driveline that has a foul odor. Pt spontaneoulsy stopped his antibiotics on 9/21. Wife instructed to restart antibiotics today and if drainage does not improve over the weekend to call us Monday as we will need to culture the driveline. The pt has a lab appt on Thursday-if drainage does not clear we will need assess the driveline.   Wife also reports that the pt is no longer taking Crestor-pt states that he feels much better since stopping Crestor.   Dr. Haroldine Laws aware of the above.  [name] RN, BSN VAD Coordinator 24/7 Pager [phone]

## 2020-04-21 ENCOUNTER — Other Ambulatory Visit: Payer: Self-pay

## 2020-04-21 ENCOUNTER — Ambulatory Visit (HOSPITAL_COMMUNITY): Payer: Self-pay | Admitting: Pharmacist

## 2020-04-21 ENCOUNTER — Ambulatory Visit (HOSPITAL_COMMUNITY)
Admission: RE | Admit: 2020-04-21 | Discharge: 2020-04-21 | Disposition: A | Discharge status: Home / Self Care | Payer: Medicare Other | Source: Ambulatory Visit | Attending: Cardiology | Admitting: Cardiology

## 2020-04-21 DIAGNOSIS — I2510 Atherosclerotic heart disease of native coronary artery without angina pectoris: Secondary | ICD-10-CM

## 2020-04-21 DIAGNOSIS — Z4801 Encounter for change or removal of surgical wound dressing: Secondary | ICD-10-CM | POA: Diagnosis not present

## 2020-04-21 DIAGNOSIS — Z7901 Long term (current) use of anticoagulants: Secondary | ICD-10-CM | POA: Diagnosis not present

## 2020-04-21 DIAGNOSIS — I5041 Acute combined systolic (congestive) and diastolic (congestive) heart failure: Secondary | ICD-10-CM

## 2020-04-21 DIAGNOSIS — Y838 Other surgical procedures as the cause of abnormal reaction of the patient, or of later complication, without mention of misadventure at the time of the procedure: Secondary | ICD-10-CM | POA: Insufficient documentation

## 2020-04-21 DIAGNOSIS — R0609 Other forms of dyspnea: Secondary | ICD-10-CM

## 2020-04-21 DIAGNOSIS — G479 Sleep disorder, unspecified: Secondary | ICD-10-CM

## 2020-04-21 DIAGNOSIS — Z95811 Presence of heart assist device: Secondary | ICD-10-CM | POA: Insufficient documentation

## 2020-04-21 DIAGNOSIS — T827XXA Infection and inflammatory reaction due to other cardiac and vascular devices, implants and grafts, initial encounter: Secondary | ICD-10-CM | POA: Diagnosis not present

## 2020-04-21 DIAGNOSIS — Z79899 Other long term (current) drug therapy: Secondary | ICD-10-CM

## 2020-04-21 DIAGNOSIS — I5023 Acute on chronic systolic (congestive) heart failure: Secondary | ICD-10-CM

## 2020-04-21 LAB — PROTIME-INR
INR: 2.3 — ABNORMAL HIGH (ref 0.8–1.2)
Prothrombin Time: 24.8 seconds — ABNORMAL HIGH (ref 11.4–15.2)

## 2020-04-21 LAB — LACTATE DEHYDROGENASE: LDH: 294 U/L — ABNORMAL HIGH (ref 98–192)

## 2020-04-21 MED ORDER — CEPHALEXIN 500 MG PO CAPS: 500.0000 mg | ORAL_CAPSULE | Freq: Three times a day (TID) | ORAL | 6 refills | Status: DC

## 2020-04-21 NOTE — Progress Notes (Signed)
Pt and wife present to VAD clinic for labs and drive line wound check.  Wife reports pt stopped Keflex on 03/03/20 because he didn't want to take anymore. She reported no drainage, redness, tenderness noted at DL exit site at that time. She advanced to weekly dressing changes with no issues until on 04/04/20 she noted "bloody" drainage. She thinks he may have "bumped" it. She noted on 04/14/20 increased "snotty drainage", redness, and foul odor. She re-started Keflex 500 mg tid and reports drainage and odor has "gotten better". She is doing every other day dressing changes with bloody drainage and no odor. She does express concern over returned "proud flesh".    Existing VAD dressing removed and site care performed using sterile technique. Exit site with old bloody drainage, slight redness around site, no tenderness noted; no rash, small amount hypergranulated tissue noted. Drive line exit site cleaned with sterile saline x 2, allowed to dry, and gauze dressing with 2 x 2's  re-applied. Drive line anchor re-applied. Pt denies fever or chills.     Darrick Grinder, NP in to assess site and applied silver nitrate on hypergranulated tissue. Continue every other day dressing changes; may advance as drainage improves. Contact VAD coordinator if any further issues with drive line site.   Provided pt with 14 daily dressing kits and 7 extra anchors for home use.  Zada Girt RN, [place] Coordinator [phone]

## 2020-04-21 NOTE — Addendum Note (Signed)
Encounter addended by: Lezlie Octave, RN on: 04/21/2020 10:28 AM  Actions taken: Order list changed, Diagnosis association updated

## 2020-04-21 NOTE — Addendum Note (Signed)
Encounter addended by: Lezlie Octave, RN on: 04/21/2020 11:11 AM  Actions taken: Clinical Note Signed

## 2020-04-21 NOTE — Progress Notes (Signed)
LVAD INR 

## 2020-04-29 ENCOUNTER — Other Ambulatory Visit (HOSPITAL_COMMUNITY): Payer: Self-pay | Admitting: Unknown Physician Specialty

## 2020-04-29 DIAGNOSIS — Z7901 Long term (current) use of anticoagulants: Secondary | ICD-10-CM

## 2020-04-29 DIAGNOSIS — Z95811 Presence of heart assist device: Secondary | ICD-10-CM

## 2020-05-04 ENCOUNTER — Other Ambulatory Visit (HOSPITAL_COMMUNITY): Payer: Medicare Other

## 2020-05-05 ENCOUNTER — Ambulatory Visit (HOSPITAL_COMMUNITY): Payer: Self-pay | Admitting: Pharmacist

## 2020-05-05 ENCOUNTER — Other Ambulatory Visit: Payer: Self-pay

## 2020-05-05 ENCOUNTER — Ambulatory Visit (HOSPITAL_COMMUNITY)
Admission: RE | Admit: 2020-05-05 | Discharge: 2020-05-05 | Disposition: A | Discharge status: Home / Self Care | Payer: Medicare Other | Source: Ambulatory Visit | Attending: Cardiology | Admitting: Cardiology

## 2020-05-05 DIAGNOSIS — Z95811 Presence of heart assist device: Secondary | ICD-10-CM | POA: Diagnosis not present

## 2020-05-05 DIAGNOSIS — Z7901 Long term (current) use of anticoagulants: Secondary | ICD-10-CM | POA: Diagnosis not present

## 2020-05-05 LAB — PROTIME-INR
INR: 2.9 — ABNORMAL HIGH (ref 0.8–1.2)
Prothrombin Time: 29.3 seconds — ABNORMAL HIGH (ref 11.4–15.2)

## 2020-05-05 LAB — LACTATE DEHYDROGENASE: LDH: 292 U/L — ABNORMAL HIGH (ref 98–192)

## 2020-05-05 NOTE — Progress Notes (Signed)
LVAD INR 

## 2020-05-18 ENCOUNTER — Ambulatory Visit (HOSPITAL_COMMUNITY): Payer: Self-pay | Admitting: Pharmacist

## 2020-05-18 ENCOUNTER — Ambulatory Visit (HOSPITAL_COMMUNITY)
Admission: RE | Admit: 2020-05-18 | Discharge: 2020-05-18 | Disposition: A | Discharge status: Home / Self Care | Payer: Medicare Other | Source: Ambulatory Visit | Attending: Internal Medicine | Admitting: Internal Medicine

## 2020-05-18 ENCOUNTER — Other Ambulatory Visit (HOSPITAL_COMMUNITY): Payer: Medicare Other

## 2020-05-18 ENCOUNTER — Other Ambulatory Visit: Payer: Self-pay

## 2020-05-18 ENCOUNTER — Other Ambulatory Visit (HOSPITAL_COMMUNITY): Payer: Self-pay | Admitting: *Deleted

## 2020-05-18 DIAGNOSIS — Z7901 Long term (current) use of anticoagulants: Secondary | ICD-10-CM

## 2020-05-18 DIAGNOSIS — Z95811 Presence of heart assist device: Secondary | ICD-10-CM

## 2020-05-18 LAB — PROTIME-INR
INR: 2.5 — ABNORMAL HIGH (ref 0.8–1.2)
Prothrombin Time: 26.3 seconds — ABNORMAL HIGH (ref 11.4–15.2)

## 2020-05-18 LAB — LACTATE DEHYDROGENASE: LDH: 328 U/L — ABNORMAL HIGH (ref 98–192)

## 2020-05-18 NOTE — Progress Notes (Signed)
LVAD INR 

## 2020-05-29 NOTE — Progress Notes (Signed)
Patient ID: Scott [name], male   DOB: Aug 25, 1945, 74 y.o.   MRN: [id number]     Primary Cardiologist: Dr Haroldine Laws INR : [facility] [place]  [name] is a 74 y/o male with h/o CAD with ankylosing spondylitis, DM2, carotid stenting, severe ischemic CM s/p anterior STEM with VT arrest in 12/15, large PE with pulmonary infarct in 2/15 s/p IVC filter and systolic HF with EF 35%.  He underwent HM II LVAD placement on 09/20/13. He had a prolonged hospital course due to recurrent chest bleeding, severe CO2 retention and RV failure.  In 8/18 admitted with acute gallstone pancreatitis. Underwent lap chole 02/21/17.   Admitted 2/18 with elevated LDH. He was started on bivalirudin. LDH went down [phone].   He presents today for routine visit. Here with his wife.  He is in a bad mood. Says he isn't sleeping well. Doesn't know why. Fatigued and cranky. Continues with arthritis pain, Denies orthopnea or PND. No fevers, chills or problems with driveline. No bleeding, melena or neuro symptoms. No VAD alarms. Taking all meds as prescribed.    VAD Indication: Destination Therapy patient choice    VAD interrogation & Equipment Management: Speed: 9200 Flow: 4.8 Power:  5.4w PI: 6.9  Alarms: multiple LV Events: rare  Fixed speed 9200 Low speed limit: 8600  Primary controller: Expired replaced [id number]. Replace back-up battery in 30 months Back up controller: Expired replaced [id number]. Back-up battery expiring in 32 months  Annual Equipment Maintenance on UBC/PM was performed today on 12/09/19.   SH:  Social History   Socioeconomic History  . Marital status: Married    Spouse name: Not on file  . Number of children: Not on file  . Years of education: Not on file  . Highest education level: Not on file  Occupational History  . Occupation: retired  Tobacco Use  . Smoking status: Former Smoker    Packs/day: 0.30    Years: 0.00    Pack years: 0.00    Types: Cigarettes     Quit date: 06/12/1969    Years since quitting: 50.9  . Smokeless tobacco: Never Used  . 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  Vaping Use  . Vaping Use: Former  Substance and Sexual Activity  . Alcohol use: No    Alcohol/week: 0.0 standard drinks  . Drug use: No  . Sexual activity: Not Currently  Other Topics Concern  . Not on file  Social History Narrative  . Not on file   Social Determinants of Health   Financial Resource Strain:   . Difficulty of Paying Living Expenses: Not on file  Food Insecurity:   . Worried About Charity fundraiser in the Last Year: Not on file  . Ran Out of Food in the Last Year: Not on file  Transportation Needs:   . Lack of Transportation (Medical): Not on file  . Lack of Transportation (Non-Medical): Not on file  Physical Activity:   . Days of Exercise per Week: Not on file  . Minutes of Exercise per Session: Not on file  Stress:   . Feeling of Stress : Not on file  Social Connections:   . Frequency of Communication with Friends and Family: Not on file  . Frequency of Social Gatherings with Friends and Family: Not on file  . Attends Religious Services: Not on file  . Active Member of Clubs or Organizations: Not on file  . Attends Archivist Meetings: Not  on file  . Marital Status: Not on file  Intimate Partner Violence:   . Fear of Current or Ex-Partner: Not on file  . Emotionally Abused: Not on file  . Physically Abused: Not on file  . Sexually Abused: Not on file    FH:  Family History  Problem Relation Age of Onset  . Hypertension Father   . Heart attack Father   . Heart Problems Sister     Past Medical History:  Diagnosis Date  . Acute on chronic respiratory failure ([place])    a. 08/2014 in setting of PE.  Marland Kitchen Ankylosing spondylitis ([place])   . Bilateral pulmonary embolism ([place])    a. 08/2014 - started on Coumadin. Retrievable IVC filter placed 08/27/14 due to RV strain and large clot burden.  Marland Kitchen  CAD (coronary artery disease)    a. stenting of LCx 2013; b. STEMI 06/12/14 s/p PCI to LAD complicated by post cath shock requiring IABP; VT s/p DCCV, EF 20%; c. NSTEMI 06/26/14 treated medically.  . Carotid artery disease ([place])    a. s/p stenting.  . Chronic systolic CHF (congestive heart failure) ([place])   . Diabetes ([place])   . Hemoptysis    a. 08/2014 possibly due to pulm infarct/PE.  Marland Kitchen Hyperlipidemia   . Hypertension   . Hypotension   . Ischemic cardiomyopathy    a. echo 08/23/2014 EF <20%, dilated CM, mod MR/TR  . Leukocytosis   . Leukocytosis 09/10/2014  . Nephrolithiasis   . Pleural effusion on right 08/2014 - small  . Reactive thrombocytosis 09/10/2014  . Right leg DVT ([place])    a. 08/2014.  Marland Kitchen Ventricular tachycardia ([place])    a. 06/2014 at time of MI, s/p DCCV.   Current Outpatient Medications  Medication Sig Dispense Refill  . acetaminophen (TYLENOL) 500 MG tablet Take 1,000 mg by mouth every 6 (six) hours as needed for moderate pain.     Marland Kitchen aspirin EC 81 MG tablet Take 1 tablet (81 mg total) by mouth daily. 90 tablet 3  . cephALEXin (KEFLEX) 500 MG capsule Take 1 capsule (500 mg total) by mouth 3 (three) times daily. 90 capsule 6  . clopidogrel (PLAVIX) 75 MG tablet Take 1 tablet (75 mg total) by mouth daily. 90 tablet 3  . cyclobenzaprine (FLEXERIL) 5 MG tablet Take 1 tablet (5 mg total) by mouth 3 (three) times daily as needed for muscle spasms. 270 tablet 3  . furosemide (LASIX) 20 MG tablet Take 1 tablet (20 mg total) by mouth daily. Take 20 mg daily; may take 40 mg if weight gain 120 tablet 3  . pantoprazole (PROTONIX) 40 MG tablet Take 1 tablet (40 mg total) by mouth at bedtime. 90 tablet 3  . potassium chloride SA (KLOR-CON) 20 MEQ tablet Every other day alternate 20 mEq (1 tablet) with 40 mEq (2 tablets) 135 tablet 3  . sildenafil (VIAGRA) 100 MG tablet Take 1 tablet (100 mg total) by mouth daily as needed for erectile dysfunction. 10 tablet 3  . traMADol (ULTRAM) 50 MG  tablet Take 2 tablets (100 mg total) by mouth every 6 (six) hours as needed for moderate pain. (Patient taking differently: Take 50 mg by mouth every 6 (six) hours as needed for moderate pain. ) 120 tablet 3  . traZODone (DESYREL) 100 MG tablet Take 2 tablets (200 mg total) by mouth at bedtime. (Patient taking differently: Take 150 mg by mouth at bedtime. ) 90 tablet 3  . warfarin (JANTOVEN) 6 MG tablet TAKE  1 TABLET DAILY EXCEPT TAKE 1 AND 1/2 TABLETS (9  MG) ON WEDNESDAY 100 tablet 3   No current facility-administered medications for this encounter.    Vital Signs:  Doppler Pressure: 92 Automatc BP:  101/82 (89) HR: 80 SPO2: 97% RA  Weight: 198.2 lb w/o eqt Last weight: 196 lbs  Wt Readings from Last 3 Encounters:  03/02/20 88.9 kg (196 lb)  12/09/19 88.8 kg (195 lb 12.8 oz)  09/30/19 84.4 kg (186 lb)   Physical Exam: General:  NAD.  HEENT: normal  Neck: supple. JVP not elevated.  Carotids 2+ bilat; no bruits. No lymphadenopathy or thryomegaly appreciated. Cor: LVAD hum.  Lungs: Clear. Abdomen: soft, nontender, non-distended. No hepatosplenomegaly. No bruits or masses. Good bowel sounds. Driveline site clean. Anchor in place.  Extremities: no cyanosis, clubbing, rash. Warm no edema  Neuro: alert & oriented x 3. No focal deficits. Moves all 4 without problem     ASSESSMENT & PLAN:  1. Chronic systolic HF: Ischemic cardiomyopathy, EF 20% s/p HMII LVAD 09/20/13.   - Physically stable but seems increasingly irritable and frustrated. Now over 6 years out.  - NYHA II. Volume status ok    2. Fatigue/insomnia - continue trazodone - unclear etiology and says he doesn't want to discuss further today.   3. CAD s/p Anterior STEMI on 06/12/14 with stenting of LAD:  - No s/s ischemia - Off ASA. Holding statin to see if helps with insomnia for a few weeks.   4 VAD management:  - Had admit in 08/2017 for elevated LDH.  - LDH stabilized 300-380. 330 today - Hgb 14.3 - Off asa. On  coumadin.  - INR 2.3.  Will try to keep INR 2.3- 2.8. Discussed dosing with PharmD personally. - Continue Keflex for mild DL irritation. No change - VAD interrogated personally. Parameters stable.   5. Ankylosing spondylitis: Off NSAIDS. -  No longer following with rheumatology. Uses tramadol. Avoids NSAIDs as much as possible. -  No change  5. HTN:  - Blood pressure well controlled. Continue current regimen.  6. Kidney Stones  - CT with renal calculi bilaterally. Asymptomatic. No intervention needed at this time.  Now followed by Dr Tresa Moore at [facility]. - No change  7. Depression - situational. Seems worse today - refuses SSRI  Total time spent 45 minutes. Over half that time spent discussing above.    [name], MD  05/29/2020

## 2020-05-30 ENCOUNTER — Other Ambulatory Visit (HOSPITAL_COMMUNITY): Payer: Self-pay | Admitting: Unknown Physician Specialty

## 2020-05-30 DIAGNOSIS — Z95811 Presence of heart assist device: Secondary | ICD-10-CM

## 2020-05-30 DIAGNOSIS — Z7901 Long term (current) use of anticoagulants: Secondary | ICD-10-CM

## 2020-06-01 ENCOUNTER — Ambulatory Visit (HOSPITAL_COMMUNITY)
Admission: RE | Admit: 2020-06-01 | Discharge: 2020-06-01 | Disposition: A | Discharge status: Home / Self Care | Payer: Medicare Other | Source: Ambulatory Visit | Attending: Internal Medicine | Admitting: Internal Medicine

## 2020-06-01 ENCOUNTER — Ambulatory Visit (HOSPITAL_COMMUNITY): Payer: Self-pay | Admitting: Pharmacist

## 2020-06-01 ENCOUNTER — Other Ambulatory Visit: Payer: Self-pay

## 2020-06-01 DIAGNOSIS — Z955 Presence of coronary angioplasty implant and graft: Secondary | ICD-10-CM | POA: Insufficient documentation

## 2020-06-01 DIAGNOSIS — Z95811 Presence of heart assist device: Secondary | ICD-10-CM

## 2020-06-01 DIAGNOSIS — Z452 Encounter for adjustment and management of vascular access device: Secondary | ICD-10-CM | POA: Insufficient documentation

## 2020-06-01 DIAGNOSIS — I5041 Acute combined systolic (congestive) and diastolic (congestive) heart failure: Secondary | ICD-10-CM

## 2020-06-01 DIAGNOSIS — G479 Sleep disorder, unspecified: Secondary | ICD-10-CM

## 2020-06-01 DIAGNOSIS — Z86711 Personal history of pulmonary embolism: Secondary | ICD-10-CM | POA: Diagnosis not present

## 2020-06-01 DIAGNOSIS — I2510 Atherosclerotic heart disease of native coronary artery without angina pectoris: Secondary | ICD-10-CM | POA: Diagnosis not present

## 2020-06-01 DIAGNOSIS — I110 Hypertensive heart disease with heart failure: Secondary | ICD-10-CM | POA: Insufficient documentation

## 2020-06-01 DIAGNOSIS — Z8674 Personal history of sudden cardiac arrest: Secondary | ICD-10-CM | POA: Insufficient documentation

## 2020-06-01 DIAGNOSIS — R0609 Other forms of dyspnea: Secondary | ICD-10-CM

## 2020-06-01 DIAGNOSIS — M459 Ankylosing spondylitis of unspecified sites in spine: Secondary | ICD-10-CM | POA: Insufficient documentation

## 2020-06-01 DIAGNOSIS — Z87891 Personal history of nicotine dependence: Secondary | ICD-10-CM | POA: Diagnosis not present

## 2020-06-01 DIAGNOSIS — I252 Old myocardial infarction: Secondary | ICD-10-CM | POA: Diagnosis not present

## 2020-06-01 DIAGNOSIS — Z86718 Personal history of other venous thrombosis and embolism: Secondary | ICD-10-CM | POA: Insufficient documentation

## 2020-06-01 DIAGNOSIS — Z8249 Family history of ischemic heart disease and other diseases of the circulatory system: Secondary | ICD-10-CM | POA: Insufficient documentation

## 2020-06-01 DIAGNOSIS — G4700 Insomnia, unspecified: Secondary | ICD-10-CM | POA: Insufficient documentation

## 2020-06-01 DIAGNOSIS — E119 Type 2 diabetes mellitus without complications: Secondary | ICD-10-CM | POA: Insufficient documentation

## 2020-06-01 DIAGNOSIS — I5023 Acute on chronic systolic (congestive) heart failure: Secondary | ICD-10-CM | POA: Diagnosis not present

## 2020-06-01 DIAGNOSIS — I255 Ischemic cardiomyopathy: Secondary | ICD-10-CM | POA: Diagnosis not present

## 2020-06-01 DIAGNOSIS — I5022 Chronic systolic (congestive) heart failure: Secondary | ICD-10-CM | POA: Insufficient documentation

## 2020-06-01 DIAGNOSIS — Z79899 Other long term (current) drug therapy: Secondary | ICD-10-CM | POA: Insufficient documentation

## 2020-06-01 DIAGNOSIS — N200 Calculus of kidney: Secondary | ICD-10-CM | POA: Diagnosis not present

## 2020-06-01 DIAGNOSIS — R0600 Dyspnea, unspecified: Secondary | ICD-10-CM | POA: Diagnosis not present

## 2020-06-01 DIAGNOSIS — Z7901 Long term (current) use of anticoagulants: Secondary | ICD-10-CM | POA: Diagnosis not present

## 2020-06-01 DIAGNOSIS — Z7902 Long term (current) use of antithrombotics/antiplatelets: Secondary | ICD-10-CM | POA: Diagnosis not present

## 2020-06-01 DIAGNOSIS — F32A Depression, unspecified: Secondary | ICD-10-CM | POA: Insufficient documentation

## 2020-06-01 DIAGNOSIS — Z7982 Long term (current) use of aspirin: Secondary | ICD-10-CM | POA: Diagnosis not present

## 2020-06-01 LAB — CBC
HCT: 44.8 % (ref 39.0–52.0)
Hemoglobin: 14.3 g/dL (ref 13.0–17.0)
MCH: 28.9 pg (ref 26.0–34.0)
MCHC: 31.9 g/dL (ref 30.0–36.0)
MCV: 90.5 fL (ref 80.0–100.0)
Platelets: 321 10*3/uL (ref 150–400)
RBC: 4.95 MIL/uL (ref 4.22–5.81)
RDW: 14.8 % (ref 11.5–15.5)
WBC: 8.7 10*3/uL (ref 4.0–10.5)
nRBC: 0 % (ref 0.0–0.2)

## 2020-06-01 LAB — BASIC METABOLIC PANEL
Anion gap: 11 (ref 5–15)
BUN: 15 mg/dL (ref 8–23)
CO2: 23 mmol/L (ref 22–32)
Calcium: 9.3 mg/dL (ref 8.9–10.3)
Chloride: 104 mmol/L (ref 98–111)
Creatinine, Ser: 0.91 mg/dL (ref 0.61–1.24)
GFR, Estimated: >60 mL/min (ref >60)
Glucose, Bld: 108 mg/dL — ABNORMAL HIGH (ref 70–99)
Potassium: 4.4 mmol/L (ref 3.5–5.1)
Sodium: 138 mmol/L (ref 135–145)

## 2020-06-01 LAB — LACTATE DEHYDROGENASE: LDH: 330 U/L — ABNORMAL HIGH (ref 98–192)

## 2020-06-01 LAB — PROTIME-INR
INR: 2.3 — ABNORMAL HIGH (ref 0.8–1.2)
Prothrombin Time: 24.3 seconds — ABNORMAL HIGH (ref 11.4–15.2)

## 2020-06-01 MED ORDER — WARFARIN SODIUM 6 MG PO TABS: ORAL_TABLET | ORAL | 11 refills | Status: DC

## 2020-06-01 MED ORDER — PANTOPRAZOLE SODIUM 40 MG PO TBEC: 40.0000 mg | DELAYED_RELEASE_TABLET | Freq: Every day | ORAL | 11 refills | Status: DC

## 2020-06-01 MED ORDER — CYCLOBENZAPRINE HCL 5 MG PO TABS: 5.0000 mg | ORAL_TABLET | Freq: Three times a day (TID) | ORAL | 3 refills | Status: DC | PRN

## 2020-06-01 NOTE — Patient Instructions (Signed)
1. No change in medications 2. Return to clinic in 3 months

## 2020-06-01 NOTE — Progress Notes (Signed)
Patient presents for 3 month follow up in [facility] today with wife. Reports no problems with VAD equipment and reports DL drainage has decreased and has no bleeding since Thursday.   Wife reports DL site drainage has decreased and she advanced to every 3 days. Wife reports no further bleeding.   Pt continues to complain about lack of sleep. Pt stopped Crestor for a bit as he read that it can cause sleep disturbance. Pt states that this did not help but he did not want to restart the Crestor. Pt refused any help or further medications for his sleep problems today.  Pt was provided with refills for Warfarin, Protonix, and Flexeril today.   Vital Signs:  Doppler Pressure: 92 Automatc BP:  101/82 (89) HR: 80 SPO2: 97% RA  Weight: 198.2 lb w/o eqt Last weight: 196 lbs  VAD Indication: Destination Therapy patient choice    VAD interrogation & Equipment Management: Speed: 9200 Flow: 4.8 Power:  5.4w    PI: 6.9  Alarms: multiple LV Events: rare  Fixed speed 9200 Low speed limit: 8600  Primary controller: Expired replaced [id number]. Replace back-up battery in 30 months Back up controller: Expired replaced [id number]. Back-up battery expiring in 32 months  Annual Equipment Maintenance on UBC/PM was performed today on 12/09/19.  I reviewed the LVAD parameters from today and compared the results to the patient's prior recorded data. LVAD interrogation was NEGATIVE for significant power changes, NEGATIVE for clinical alarms and STABLE for PI events/speed drops. No programming changes were made and pump is functioning within specified parameters. Pt is performing daily controller and system monitor self tests along with completing weekly and monthly maintenance for LVAD equipment.  LVAD equipment check completed and is in good working order. Back-up equipment present.   Exit Site Care: Drive line is being maintained weekly  by his wife, [name].Dressing dry and intact. Stabilization  device present and accurately applied. Pt denies fever or chills. 25 anchors, 28 daily dressing kits provided for home use.    Significant Events on VAD Support:  02/21/17> lap chole  Device:none   BP & Labs:  MAP 92 - Doppler is reflecting Modified systolic  Hgb 79.7 - No S/S of bleeding. Specifically denies melena/BRBPR or nosebleeds.  LDH 330 and is within established baseline of 250- 360. Pt denies tea-colored urine. No power elevations noted on interrogation. We will continue to check this with his INR.   Patient Instructions:  1. No changes in medications 2. Return to [facility] in 3 mos  [name] RN [place] Coordinator  Office: [phone]  24/7 Pager: [phone]

## 2020-06-01 NOTE — Progress Notes (Signed)
LVAD INR 

## 2020-06-10 ENCOUNTER — Other Ambulatory Visit (HOSPITAL_COMMUNITY): Payer: Self-pay | Admitting: Unknown Physician Specialty

## 2020-06-10 DIAGNOSIS — Z7901 Long term (current) use of anticoagulants: Secondary | ICD-10-CM

## 2020-06-10 DIAGNOSIS — Z95811 Presence of heart assist device: Secondary | ICD-10-CM

## 2020-06-15 ENCOUNTER — Other Ambulatory Visit: Payer: Self-pay

## 2020-06-15 ENCOUNTER — Ambulatory Visit (HOSPITAL_COMMUNITY): Payer: Self-pay | Admitting: Pharmacist

## 2020-06-15 ENCOUNTER — Ambulatory Visit (HOSPITAL_COMMUNITY)
Admission: RE | Admit: 2020-06-15 | Discharge: 2020-06-15 | Disposition: A | Discharge status: Home / Self Care | Payer: Medicare Other | Source: Ambulatory Visit | Attending: Internal Medicine | Admitting: Internal Medicine

## 2020-06-15 DIAGNOSIS — Z95811 Presence of heart assist device: Secondary | ICD-10-CM | POA: Diagnosis not present

## 2020-06-15 DIAGNOSIS — Z7901 Long term (current) use of anticoagulants: Secondary | ICD-10-CM | POA: Diagnosis not present

## 2020-06-15 LAB — PROTIME-INR
INR: 2.6 — ABNORMAL HIGH (ref 0.8–1.2)
Prothrombin Time: 26.6 seconds — ABNORMAL HIGH (ref 11.4–15.2)

## 2020-06-15 LAB — LACTATE DEHYDROGENASE: LDH: 330 U/L — ABNORMAL HIGH (ref 98–192)

## 2020-06-15 NOTE — Progress Notes (Signed)
LVAD INR 

## 2020-06-23 ENCOUNTER — Other Ambulatory Visit (HOSPITAL_COMMUNITY): Payer: Self-pay | Admitting: Unknown Physician Specialty

## 2020-06-23 DIAGNOSIS — Z95811 Presence of heart assist device: Secondary | ICD-10-CM

## 2020-06-23 DIAGNOSIS — Z7901 Long term (current) use of anticoagulants: Secondary | ICD-10-CM

## 2020-06-29 ENCOUNTER — Ambulatory Visit (HOSPITAL_COMMUNITY): Payer: Self-pay | Admitting: Pharmacist

## 2020-06-29 ENCOUNTER — Ambulatory Visit (HOSPITAL_COMMUNITY)
Admission: RE | Admit: 2020-06-29 | Discharge: 2020-06-29 | Disposition: A | Discharge status: Home / Self Care | Payer: Medicare Other | Source: Ambulatory Visit | Attending: Cardiology | Admitting: Cardiology

## 2020-06-29 ENCOUNTER — Other Ambulatory Visit: Payer: Self-pay

## 2020-06-29 DIAGNOSIS — Z7901 Long term (current) use of anticoagulants: Secondary | ICD-10-CM | POA: Diagnosis not present

## 2020-06-29 DIAGNOSIS — Z95811 Presence of heart assist device: Secondary | ICD-10-CM | POA: Insufficient documentation

## 2020-06-29 LAB — PROTIME-INR
INR: 2.6 — ABNORMAL HIGH (ref 0.8–1.2)
Prothrombin Time: 26.6 seconds — ABNORMAL HIGH (ref 11.4–15.2)

## 2020-06-29 LAB — LACTATE DEHYDROGENASE: LDH: 305 U/L — ABNORMAL HIGH (ref 98–192)

## 2020-06-29 NOTE — Progress Notes (Signed)
LVAD INR 

## 2020-06-30 ENCOUNTER — Other Ambulatory Visit (HOSPITAL_COMMUNITY): Payer: Self-pay | Admitting: *Deleted

## 2020-06-30 DIAGNOSIS — G8929 Other chronic pain: Secondary | ICD-10-CM

## 2020-06-30 MED ORDER — CYCLOBENZAPRINE HCL 5 MG PO TABS: 5.0000 mg | ORAL_TABLET | Freq: Three times a day (TID) | ORAL | 5 refills | Status: DC | PRN

## 2020-07-07 ENCOUNTER — Other Ambulatory Visit (HOSPITAL_COMMUNITY): Payer: Self-pay | Admitting: Unknown Physician Specialty

## 2020-07-07 DIAGNOSIS — Z7901 Long term (current) use of anticoagulants: Secondary | ICD-10-CM

## 2020-07-07 DIAGNOSIS — Z95811 Presence of heart assist device: Secondary | ICD-10-CM

## 2020-07-20 ENCOUNTER — Other Ambulatory Visit: Payer: Self-pay

## 2020-07-20 ENCOUNTER — Ambulatory Visit (HOSPITAL_COMMUNITY): Payer: Self-pay | Admitting: Pharmacist

## 2020-07-20 ENCOUNTER — Ambulatory Visit (HOSPITAL_COMMUNITY)
Admission: RE | Admit: 2020-07-20 | Discharge: 2020-07-20 | Disposition: A | Discharge status: Home / Self Care | Payer: Medicare Other | Source: Ambulatory Visit | Attending: Cardiology | Admitting: Cardiology

## 2020-07-20 DIAGNOSIS — Z7901 Long term (current) use of anticoagulants: Secondary | ICD-10-CM | POA: Diagnosis not present

## 2020-07-20 DIAGNOSIS — Z95811 Presence of heart assist device: Secondary | ICD-10-CM | POA: Diagnosis not present

## 2020-07-20 LAB — PROTIME-INR
INR: 2.4 — ABNORMAL HIGH (ref 0.8–1.2)
Prothrombin Time: 25.4 seconds — ABNORMAL HIGH (ref 11.4–15.2)

## 2020-07-20 LAB — LACTATE DEHYDROGENASE: LDH: 275 U/L — ABNORMAL HIGH (ref 98–192)

## 2020-07-20 NOTE — Progress Notes (Signed)
LVAD INR 

## 2020-08-05 ENCOUNTER — Other Ambulatory Visit (HOSPITAL_COMMUNITY): Payer: Self-pay | Admitting: *Deleted

## 2020-08-05 DIAGNOSIS — Z7901 Long term (current) use of anticoagulants: Secondary | ICD-10-CM

## 2020-08-05 DIAGNOSIS — Z95811 Presence of heart assist device: Secondary | ICD-10-CM

## 2020-08-10 ENCOUNTER — Other Ambulatory Visit: Payer: Self-pay

## 2020-08-10 ENCOUNTER — Ambulatory Visit (HOSPITAL_COMMUNITY): Payer: Self-pay | Admitting: Pharmacist

## 2020-08-10 ENCOUNTER — Ambulatory Visit (HOSPITAL_COMMUNITY)
Admission: RE | Admit: 2020-08-10 | Discharge: 2020-08-10 | Disposition: A | Discharge status: Home / Self Care | Payer: Medicare Other | Source: Ambulatory Visit | Attending: Internal Medicine | Admitting: Internal Medicine

## 2020-08-10 DIAGNOSIS — Z95811 Presence of heart assist device: Secondary | ICD-10-CM | POA: Insufficient documentation

## 2020-08-10 DIAGNOSIS — Z7901 Long term (current) use of anticoagulants: Secondary | ICD-10-CM | POA: Diagnosis not present

## 2020-08-10 DIAGNOSIS — Z5181 Encounter for therapeutic drug level monitoring: Secondary | ICD-10-CM | POA: Insufficient documentation

## 2020-08-10 LAB — PROTIME-INR
INR: 2.7 — ABNORMAL HIGH (ref 0.8–1.2)
Prothrombin Time: 28 seconds — ABNORMAL HIGH (ref 11.4–15.2)

## 2020-08-10 LAB — LACTATE DEHYDROGENASE: LDH: 305 U/L — ABNORMAL HIGH (ref 98–192)

## 2020-08-10 NOTE — Progress Notes (Signed)
LVAD INR 

## 2020-08-29 ENCOUNTER — Other Ambulatory Visit (HOSPITAL_COMMUNITY): Payer: Self-pay | Admitting: *Deleted

## 2020-08-29 DIAGNOSIS — Z7901 Long term (current) use of anticoagulants: Secondary | ICD-10-CM

## 2020-08-29 DIAGNOSIS — Z95811 Presence of heart assist device: Secondary | ICD-10-CM

## 2020-08-30 ENCOUNTER — Ambulatory Visit (HOSPITAL_COMMUNITY)
Admission: RE | Admit: 2020-08-30 | Discharge: 2020-08-30 | Disposition: A | Discharge status: Home / Self Care | Payer: Medicare Other | Source: Ambulatory Visit | Attending: Cardiology | Admitting: Cardiology

## 2020-08-30 ENCOUNTER — Ambulatory Visit (HOSPITAL_COMMUNITY): Payer: Self-pay | Admitting: Pharmacist

## 2020-08-30 ENCOUNTER — Other Ambulatory Visit: Payer: Self-pay

## 2020-08-30 DIAGNOSIS — I2510 Atherosclerotic heart disease of native coronary artery without angina pectoris: Secondary | ICD-10-CM | POA: Insufficient documentation

## 2020-08-30 DIAGNOSIS — I5022 Chronic systolic (congestive) heart failure: Secondary | ICD-10-CM | POA: Diagnosis not present

## 2020-08-30 DIAGNOSIS — Z87891 Personal history of nicotine dependence: Secondary | ICD-10-CM | POA: Insufficient documentation

## 2020-08-30 DIAGNOSIS — Z452 Encounter for adjustment and management of vascular access device: Secondary | ICD-10-CM | POA: Insufficient documentation

## 2020-08-30 DIAGNOSIS — Z7901 Long term (current) use of anticoagulants: Secondary | ICD-10-CM | POA: Diagnosis not present

## 2020-08-30 DIAGNOSIS — E119 Type 2 diabetes mellitus without complications: Secondary | ICD-10-CM | POA: Diagnosis not present

## 2020-08-30 DIAGNOSIS — Z8674 Personal history of sudden cardiac arrest: Secondary | ICD-10-CM | POA: Diagnosis not present

## 2020-08-30 DIAGNOSIS — Z86711 Personal history of pulmonary embolism: Secondary | ICD-10-CM | POA: Insufficient documentation

## 2020-08-30 DIAGNOSIS — Z95811 Presence of heart assist device: Secondary | ICD-10-CM | POA: Diagnosis not present

## 2020-08-30 DIAGNOSIS — Z7902 Long term (current) use of antithrombotics/antiplatelets: Secondary | ICD-10-CM | POA: Diagnosis not present

## 2020-08-30 DIAGNOSIS — G4700 Insomnia, unspecified: Secondary | ICD-10-CM | POA: Insufficient documentation

## 2020-08-30 DIAGNOSIS — N200 Calculus of kidney: Secondary | ICD-10-CM | POA: Diagnosis not present

## 2020-08-30 DIAGNOSIS — G8929 Other chronic pain: Secondary | ICD-10-CM

## 2020-08-30 DIAGNOSIS — M459 Ankylosing spondylitis of unspecified sites in spine: Secondary | ICD-10-CM | POA: Diagnosis not present

## 2020-08-30 DIAGNOSIS — T827XXA Infection and inflammatory reaction due to other cardiac and vascular devices, implants and grafts, initial encounter: Secondary | ICD-10-CM

## 2020-08-30 DIAGNOSIS — Z79899 Other long term (current) drug therapy: Secondary | ICD-10-CM | POA: Diagnosis not present

## 2020-08-30 DIAGNOSIS — I255 Ischemic cardiomyopathy: Secondary | ICD-10-CM | POA: Diagnosis not present

## 2020-08-30 DIAGNOSIS — Z86718 Personal history of other venous thrombosis and embolism: Secondary | ICD-10-CM | POA: Insufficient documentation

## 2020-08-30 DIAGNOSIS — I110 Hypertensive heart disease with heart failure: Secondary | ICD-10-CM | POA: Insufficient documentation

## 2020-08-30 DIAGNOSIS — F4321 Adjustment disorder with depressed mood: Secondary | ICD-10-CM | POA: Diagnosis not present

## 2020-08-30 DIAGNOSIS — Z8249 Family history of ischemic heart disease and other diseases of the circulatory system: Secondary | ICD-10-CM | POA: Diagnosis not present

## 2020-08-30 DIAGNOSIS — Z955 Presence of coronary angioplasty implant and graft: Secondary | ICD-10-CM | POA: Diagnosis not present

## 2020-08-30 LAB — COMPREHENSIVE METABOLIC PANEL
ALT: 20 U/L (ref 0–44)
AST: 29 U/L (ref 15–41)
Albumin: 4 g/dL (ref 3.5–5.0)
Alkaline Phosphatase: 91 U/L (ref 38–126)
Anion gap: 9 (ref 5–15)
BUN: 15 mg/dL (ref 8–23)
CO2: 25 mmol/L (ref 22–32)
Calcium: 9.4 mg/dL (ref 8.9–10.3)
Chloride: 104 mmol/L (ref 98–111)
Creatinine, Ser: 1.09 mg/dL (ref 0.61–1.24)
GFR, Estimated: >60 mL/min (ref >60)
Glucose, Bld: 94 mg/dL (ref 70–99)
Potassium: 4.1 mmol/L (ref 3.5–5.1)
Sodium: 138 mmol/L (ref 135–145)
Total Bilirubin: 0.6 mg/dL (ref 0.3–1.2)
Total Protein: 8.3 g/dL — ABNORMAL HIGH (ref 6.5–8.1)

## 2020-08-30 LAB — PROTIME-INR
INR: 2.8 — ABNORMAL HIGH (ref 0.8–1.2)
Prothrombin Time: 28.9 seconds — ABNORMAL HIGH (ref 11.4–15.2)

## 2020-08-30 LAB — CBC
HCT: 42.4 % (ref 39.0–52.0)
Hemoglobin: 13.9 g/dL (ref 13.0–17.0)
MCH: 28.8 pg (ref 26.0–34.0)
MCHC: 32.8 g/dL (ref 30.0–36.0)
MCV: 88 fL (ref 80.0–100.0)
Platelets: 327 10*3/uL (ref 150–400)
RBC: 4.82 MIL/uL (ref 4.22–5.81)
RDW: 15.3 % (ref 11.5–15.5)
WBC: 7.9 10*3/uL (ref 4.0–10.5)
nRBC: 0 % (ref 0.0–0.2)

## 2020-08-30 LAB — LACTATE DEHYDROGENASE: LDH: 353 U/L — ABNORMAL HIGH (ref 98–192)

## 2020-08-30 LAB — PREALBUMIN: Prealbumin: 18.9 mg/dL (ref 18–38)

## 2020-08-30 MED ORDER — CEPHALEXIN 500 MG PO CAPS: 500.0000 mg | ORAL_CAPSULE | Freq: Two times a day (BID) | ORAL | 6 refills | Status: DC

## 2020-08-30 MED ORDER — SILDENAFIL CITRATE 100 MG PO TABS: 100.0000 mg | ORAL_TABLET | Freq: Every day | ORAL | 3 refills | Status: DC | PRN

## 2020-08-30 MED ORDER — CLOPIDOGREL BISULFATE 75 MG PO TABS: 75.0000 mg | ORAL_TABLET | Freq: Every day | ORAL | 3 refills | Status: DC

## 2020-08-30 MED ORDER — TRAMADOL HCL 50 MG PO TABS: 100.0000 mg | ORAL_TABLET | Freq: Four times a day (QID) | ORAL | 3 refills | Status: DC | PRN

## 2020-08-30 NOTE — Progress Notes (Signed)
Patient presents for 3 month follow up with 6 year Intermacs and annual maintenance in [facility] today with wife. Reports no problems with VAD equipment and reports DL drainage has decreased and has minimal bleeding.   Denies lightheadedness, dizziness, or falls. Reports shortness of breath with activity, which is not new for him. Reports he is sleeping better.   Wife reports DL site drainage has decreased and she advanced to twice weekly. Wife reports occasional bleeding noted. Pt refused to allow VAD coordinator to change VAD dressing today. Pt's wife requesting Keflex be decreased to twice a day. Dr Haroldine Laws in agreement.   Pt was provided with refills for Plavix, Sildenafil, and Tramadol per Dr Haroldine Laws.  Annual maintenance today per biomed. Pt did not want a new patient cable at this time. He was provided with 8 new batteries today.   Vital Signs:  Doppler Pressure: 92 Automatc BP:  111/65 (85)  HR: 80 SR w/ occasional PACs SPO2: 97% RA  Weight: 195.6 lb w/o eqt Last weight: 198.2 lbs  VAD Indication: Destination Therapy patient choice    VAD interrogation & Equipment Management: Speed: 9200 Flow: 4.8 Power:  5.4w    PI: 6.5  Alarms: multiple LV Events: rare   Fixed speed 9200 Low speed limit: 8600  Primary controller: Replace back-up battery in 29 months Back up controller: Replace back-up battery in 30 months  Annual Equipment Maintenance on UBC/PM was performed today on 08/30/2020.  I reviewed the LVAD parameters from today and compared the results to the patient's prior recorded data. LVAD interrogation was NEGATIVE for significant power changes, NEGATIVE for clinical alarms and STABLE for PI events/speed drops. No programming changes were made and pump is functioning within specified parameters. Pt is performing daily controller and system monitor self tests along with completing weekly and monthly maintenance for LVAD equipment.  LVAD equipment check  completed and is in good working order. Back-up equipment present.   Exit Site Care: Drive line is being maintained weekly  by his wife, [name].Dressing dry and intact. Stabilization device present and accurately applied. Pt denies fever or chills. 20 anchors and several sorbaview dressings provided for home use.   Significant Events on VAD Support:  02/21/17> lap chole  Device:none   BP & Labs:  MAP 92 - Doppler is reflecting Modified systolic  Hgb 10.2 - No S/S of bleeding. Specifically denies melena/BRBPR or nosebleeds.  LDH 353 and is within established baseline of 250- 360. Pt denies tea-colored urine. No power elevations noted on interrogation. We will continue to check this with his INR.   6 year Intermacs follow up completed including:  Quality of Life, KCCQ-12, and Neurocognitive trail making.   Pt completed 900 feet during 6 minute walk.  Back up controller: 11V backup battery charged during this visit.   Batteries Manufacture Date: Number of uses: Re-calibration  04/11/17 262 Performed by patient   Annual maintenance completed per Biomed on patient's home power module and Electrical engineer.    8 new batteries provided to patient today.   [place] Cardiomyopathy Questionnaire  KCCQ-12 08/30/2020 03/02/2020  1 a. Ability to shower/bathe Slightly limited Quite a bit limited  1 b. Ability to walk 1 block Quite a bit limited Moderately limited  1 c. Ability to hurry/jog Extremely limited Extremely limited  2. Edema feet/ankles/legs Never over the past 2 weeks Never over the past 2 weeks  3. Limited by fatigue All of the time All of the time  4. Limited  by dyspnea All of the time All of the time  5. Sitting up / on 3+ pillows Never over the past 2 weeks Never over the past 2 weeks  6. Limited enjoyment of life Moderately limited Limited quite a bit  7. Rest of life w/ symptoms Mostly dissatisfied Mostly dissatisfied  8 a. Participation in hobbies Limited  quite a bit Limited quite a bit  8 b. Participation in chores Limited quite a bit Limited quite a bit  8 c. Visiting family/friends Moderately limited Moderately limited    Patient Instructions:  1. No medication changes 2. Coumadin doing per Ander Purpura PharmD 3. Return to clinic in 3 months  Emerson Monte RN [place] Coordinator  Office: [phone]  24/7 Pager: [phone]

## 2020-08-30 NOTE — Progress Notes (Signed)
LVAD INR 

## 2020-08-30 NOTE — Patient Instructions (Signed)
1. No medication changes 2. Coumadin doing per Ander Purpura PharmD 3. Return to clinic in 3 months

## 2020-08-31 NOTE — Progress Notes (Signed)
Patient ID: Scott [name], male   DOB: [birth year], 75 y.o.   MRN: [id number]     Primary Cardiologist: Dr Haroldine Laws INR : [facility]  HPI: Scott [name] is a 75 y/o male with h/o CAD with ankylosing spondylitis, DM2, carotid stenting, severe ischemic CM s/p anterior STEM with VT arrest in 12/15, large PE with pulmonary infarct in 2/15 s/p IVC filter and systolic HF with EF 08%.  He underwent HM II LVAD placement on 09/20/13. He had a prolonged hospital course due to recurrent chest bleeding, severe CO2 retention and RV failure.  In 8/18 admitted with acute gallstone pancreatitis. Underwent lap chole 02/21/17.   Admitted 2/18 with elevated LDH. He was started on bivalirudin. LDH went down [phone].   He presents today for routine visit. Here with his wife. Says he is doing pretty well. Feels better than last visit. Remains on keflex 3x/day for DL infection. Wife reports that drainage is improved and now doing dressing changes 2x/week. No fevers or chills. Denies orthopnea or PND. No bleeding, melena or neuro symptoms. No VAD alarms. Taking all meds as prescribed.    VAD Indication: Destination Therapy patient choice    VAD interrogation & Equipment Management: Speed: 9200 Flow: 4.8 Power:  5.4w PI: 6.5  Alarms: multiple LV Events: rare   Fixed speed 9200 Low speed limit: 8600  Primary controller: Replace back-up battery in 29 months Back up controller: Replace back-up battery in 30 months   SH:  Social History   Socioeconomic History  . Marital status: Married    Spouse name: Not on file  . Number of children: Not on file  . Years of education: Not on file  . Highest education level: Not on file  Occupational History  . Occupation: retired  Tobacco Use  . Smoking status: Former Smoker    Packs/day: 0.30    Years: 0.00    Pack years: 0.00    Types: Cigarettes    Quit date: 06/12/1969    Years since quitting: 51.2  . Smokeless tobacco: Never Used  .  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  Vaping Use  . Vaping Use: Former  Substance and Sexual Activity  . Alcohol use: No    Alcohol/week: 0.0 standard drinks  . Drug use: No  . Sexual activity: Not Currently  Other Topics Concern  . Not on file  Social History Narrative  . Not on file   Social Determinants of Health   Financial Resource Strain: Not on file  Food Insecurity: Not on file  Transportation Needs: Not on file  Physical Activity: Not on file  Stress: Not on file  Social Connections: Not on file  Intimate Partner Violence: Not on file    FH:  Family History  Problem Relation Age of Onset  . Hypertension Father   . Heart attack Father   . Heart Problems Sister     Past Medical History:  Diagnosis Date  . Acute on chronic respiratory failure ([place])    a. 08/2014 in setting of PE.  Marland Kitchen Ankylosing spondylitis ([place])   . Bilateral pulmonary embolism ([place])    a. 08/2014 - started on Coumadin. Retrievable IVC filter placed 08/27/14 due to RV strain and large clot burden.  Marland Kitchen CAD (coronary artery disease)    a. stenting of LCx 2013; b. STEMI 06/12/14 s/p PCI to LAD complicated by post cath shock requiring IABP; VT s/p DCCV, EF 20%; c. NSTEMI 06/26/14 treated medically.  Marland Kitchen  Carotid artery disease ([place])    a. s/p stenting.  . Chronic systolic CHF (congestive heart failure) ([place])   . Diabetes ([place])   . Hemoptysis    a. 08/2014 possibly due to pulm infarct/PE.  Marland Kitchen Hyperlipidemia   . Hypertension   . Hypotension   . Ischemic cardiomyopathy    a. echo 08/23/2014 EF <20%, dilated CM, mod MR/TR  . Leukocytosis   . Leukocytosis 09/10/2014  . Nephrolithiasis   . Pleural effusion on right 08/2014 - small  . Reactive thrombocytosis 09/10/2014  . Right leg DVT ([place])    a. 08/2014.  Marland Kitchen Ventricular tachycardia ([place])    a. 06/2014 at time of MI, s/p DCCV.   Current Outpatient Medications  Medication Sig Dispense Refill  . acetaminophen (TYLENOL) 500 MG  tablet Take 1,000 mg by mouth every 6 (six) hours as needed for moderate pain.     Marland Kitchen aspirin EC 81 MG tablet Take 1 tablet (81 mg total) by mouth daily. 90 tablet 3  . cyclobenzaprine (FLEXERIL) 5 MG tablet Take 1 tablet (5 mg total) by mouth 3 (three) times daily as needed for muscle spasms. 90 tablet 5  . furosemide (LASIX) 20 MG tablet Take 1 tablet (20 mg total) by mouth daily. Take 20 mg daily; may take 40 mg if weight gain 120 tablet 3  . pantoprazole (PROTONIX) 40 MG tablet Take 1 tablet (40 mg total) by mouth at bedtime. 90 tablet 11  . potassium chloride SA (KLOR-CON) 20 MEQ tablet Every other day alternate 20 mEq (1 tablet) with 40 mEq (2 tablets) 135 tablet 3  . traZODone (DESYREL) 100 MG tablet Take 2 tablets (200 mg total) by mouth at bedtime. (Patient taking differently: Take 150 mg by mouth at bedtime.) 90 tablet 3  . warfarin (JANTOVEN) 6 MG tablet TAKE 1 TABLET DAILY EXCEPT TAKE 1 AND 1/2 TABLETS (9  MG) ON WEDNESDAY 100 tablet 11  . cephALEXin (KEFLEX) 500 MG capsule Take 1 capsule (500 mg total) by mouth 2 (two) times daily. 90 capsule 6  . clopidogrel (PLAVIX) 75 MG tablet Take 1 tablet (75 mg total) by mouth daily. 90 tablet 3  . sildenafil (VIAGRA) 100 MG tablet Take 1 tablet (100 mg total) by mouth daily as needed for erectile dysfunction. 10 tablet 3  . traMADol (ULTRAM) 50 MG tablet Take 2 tablets (100 mg total) by mouth every 6 (six) hours as needed for moderate pain. 120 tablet 3   No current facility-administered medications for this encounter.    Vital Signs:  Doppler Pressure: 92 Automatc BP:  111/65 (85)  HR: 80 SR w/ occasional PACs SPO2: 97% RA  Weight: 195.6 lb w/o eqt Last weight: 198.2 lbs  Wt Readings from Last 3 Encounters:  06/01/20 89.9 kg (198 lb 3.2 oz)  03/02/20 88.9 kg (196 lb)  12/09/19 88.8 kg (195 lb 12.8 oz)   Physical Exam: General:  NAD.  HEENT: normal  Neck: supple. JVP not elevated.  Carotids 2+ bilat; no bruits. No  lymphadenopathy or thryomegaly appreciated. Cor: LVAD hum.  Lungs: Clear. Abdomen: soft, nontender, non-distended. No hepatosplenomegaly. No bruits or masses. Good bowel sounds. Driveline site clean. Anchor in place.  Extremities: no cyanosis, clubbing, rash. Warm no edema  Neuro: alert & oriented x 3. No focal deficits. Moves all 4 without problem    ASSESSMENT & PLAN:  1. Chronic systolic HF: Ischemic cardiomyopathy, EF 20% s/p HMII LVAD 09/20/13.   - Doing well with VAD support Remains NYHA  II. Now 7 years out - Volume status and MAPs ok continue current regimen  2. CAD s/p Anterior STEMI on 06/12/14 with stenting of LAD:  - No s/s ischemia - Off ASA. He has stopped statin as he feels it was contributing to his insomnia  3. DL infection - improving. Will drop Keflex to bid  4 VAD management:  - Had admit in 08/2017 for elevated LDH.  - LDH stabilized 300-380. 330-> 353 today - Hgb 13.9 - Off asa. On coumadin.  - INR 2.7.  Will try to keep INR 2.3- 2.8. Discussed dosing with PharmD personally. - Continue Keflex as above - VAD interrogated personally. Parameters stable.   5. Ankylosing spondylitis: Off NSAIDS. -  No longer following with rheumatology. Uses tramadol. Avoids NSAIDs as much as possible. -  Stable  6. HTN:  - MAPs look good  7. Kidney Stones  - CT with renal calculi bilaterally. Asymptomatic. No intervention needed at this time.  Now followed by Dr Tresa Moore at [facility]. - No change  8. Depression - situational. Seems better today - has refused SSRI  Total time spent 45 minutes. Over half that time spent discussing above.   [name], MD  08/31/2020

## 2020-09-14 ENCOUNTER — Other Ambulatory Visit (HOSPITAL_COMMUNITY): Payer: Self-pay | Admitting: *Deleted

## 2020-09-14 DIAGNOSIS — Z7901 Long term (current) use of anticoagulants: Secondary | ICD-10-CM

## 2020-09-14 DIAGNOSIS — Z95811 Presence of heart assist device: Secondary | ICD-10-CM

## 2020-09-21 ENCOUNTER — Ambulatory Visit (HOSPITAL_COMMUNITY): Payer: Self-pay | Admitting: Pharmacist

## 2020-09-21 ENCOUNTER — Ambulatory Visit (HOSPITAL_COMMUNITY)
Admission: RE | Admit: 2020-09-21 | Discharge: 2020-09-21 | Disposition: A | Discharge status: Home / Self Care | Payer: Medicare Other | Source: Ambulatory Visit | Attending: Internal Medicine | Admitting: Internal Medicine

## 2020-09-21 ENCOUNTER — Other Ambulatory Visit: Payer: Self-pay

## 2020-09-21 DIAGNOSIS — Z7901 Long term (current) use of anticoagulants: Secondary | ICD-10-CM | POA: Diagnosis not present

## 2020-09-21 DIAGNOSIS — Z95811 Presence of heart assist device: Secondary | ICD-10-CM | POA: Insufficient documentation

## 2020-09-21 LAB — LACTATE DEHYDROGENASE: LDH: 361 U/L — ABNORMAL HIGH (ref 98–192)

## 2020-09-21 LAB — PROTIME-INR
INR: 2.4 — ABNORMAL HIGH (ref 0.8–1.2)
Prothrombin Time: 25.5 seconds — ABNORMAL HIGH (ref 11.4–15.2)

## 2020-09-21 NOTE — Progress Notes (Signed)
LVAD INR 

## 2020-10-07 ENCOUNTER — Other Ambulatory Visit (HOSPITAL_COMMUNITY): Payer: Self-pay | Admitting: *Deleted

## 2020-10-07 DIAGNOSIS — Z7901 Long term (current) use of anticoagulants: Secondary | ICD-10-CM

## 2020-10-07 DIAGNOSIS — Z95811 Presence of heart assist device: Secondary | ICD-10-CM

## 2020-10-11 ENCOUNTER — Other Ambulatory Visit (HOSPITAL_COMMUNITY): Payer: Self-pay

## 2020-10-12 ENCOUNTER — Ambulatory Visit (HOSPITAL_COMMUNITY)
Admission: RE | Admit: 2020-10-12 | Discharge: 2020-10-12 | Disposition: A | Discharge status: Home / Self Care | Payer: Medicare Other | Source: Ambulatory Visit | Attending: Internal Medicine | Admitting: Internal Medicine

## 2020-10-12 ENCOUNTER — Ambulatory Visit (HOSPITAL_COMMUNITY): Payer: Self-pay | Admitting: Pharmacist

## 2020-10-12 ENCOUNTER — Other Ambulatory Visit: Payer: Self-pay

## 2020-10-12 DIAGNOSIS — Z7901 Long term (current) use of anticoagulants: Secondary | ICD-10-CM | POA: Diagnosis not present

## 2020-10-12 DIAGNOSIS — Z95811 Presence of heart assist device: Secondary | ICD-10-CM | POA: Insufficient documentation

## 2020-10-12 LAB — PROTIME-INR
INR: 3.2 — ABNORMAL HIGH (ref 0.8–1.2)
Prothrombin Time: 33 seconds — ABNORMAL HIGH (ref 11.4–15.2)

## 2020-10-12 LAB — LACTATE DEHYDROGENASE: LDH: 337 U/L — ABNORMAL HIGH (ref 98–192)

## 2020-10-12 NOTE — Progress Notes (Signed)
LVAD INR 

## 2020-10-24 ENCOUNTER — Telehealth (HOSPITAL_COMMUNITY): Payer: Self-pay | Admitting: Licensed Clinical Social Worker

## 2020-10-24 NOTE — Telephone Encounter (Signed)
CSW contacted patient to remind of tomorrow's LVAD Support Group meeting at 1pm. Jackie Preston Weill, LCSW, CCSW-MCS [phone]  

## 2020-10-27 ENCOUNTER — Other Ambulatory Visit (HOSPITAL_COMMUNITY): Payer: Self-pay | Admitting: *Deleted

## 2020-10-27 DIAGNOSIS — Z7901 Long term (current) use of anticoagulants: Secondary | ICD-10-CM

## 2020-10-27 DIAGNOSIS — Z95811 Presence of heart assist device: Secondary | ICD-10-CM

## 2020-11-01 DIAGNOSIS — L409 Psoriasis, unspecified: Secondary | ICD-10-CM | POA: Diagnosis not present

## 2020-11-02 ENCOUNTER — Ambulatory Visit (HOSPITAL_COMMUNITY): Payer: Self-pay | Admitting: Pharmacist

## 2020-11-02 ENCOUNTER — Ambulatory Visit (HOSPITAL_COMMUNITY)
Admission: RE | Admit: 2020-11-02 | Discharge: 2020-11-02 | Disposition: A | Discharge status: Home / Self Care | Payer: Medicare Other | Source: Ambulatory Visit | Attending: Internal Medicine | Admitting: Internal Medicine

## 2020-11-02 ENCOUNTER — Other Ambulatory Visit: Payer: Self-pay

## 2020-11-02 DIAGNOSIS — Z7901 Long term (current) use of anticoagulants: Secondary | ICD-10-CM | POA: Diagnosis not present

## 2020-11-02 DIAGNOSIS — Z95811 Presence of heart assist device: Secondary | ICD-10-CM | POA: Diagnosis not present

## 2020-11-02 LAB — LACTATE DEHYDROGENASE: LDH: 423 U/L — ABNORMAL HIGH (ref 98–192)

## 2020-11-02 LAB — PROTIME-INR
INR: 2.7 — ABNORMAL HIGH (ref 0.8–1.2)
Prothrombin Time: 28.8 seconds — ABNORMAL HIGH (ref 11.4–15.2)

## 2020-11-02 NOTE — Progress Notes (Signed)
LVAD INR 

## 2020-11-18 ENCOUNTER — Other Ambulatory Visit (HOSPITAL_COMMUNITY): Payer: Self-pay | Admitting: *Deleted

## 2020-11-18 DIAGNOSIS — Z95811 Presence of heart assist device: Secondary | ICD-10-CM

## 2020-11-18 DIAGNOSIS — Z7901 Long term (current) use of anticoagulants: Secondary | ICD-10-CM

## 2020-11-23 ENCOUNTER — Ambulatory Visit (HOSPITAL_COMMUNITY): Payer: Self-pay

## 2020-11-23 ENCOUNTER — Other Ambulatory Visit: Payer: Self-pay

## 2020-11-23 ENCOUNTER — Ambulatory Visit (HOSPITAL_COMMUNITY)
Admission: RE | Admit: 2020-11-23 | Discharge: 2020-11-23 | Disposition: A | Discharge status: Home / Self Care | Payer: Medicare Other | Source: Ambulatory Visit | Attending: Internal Medicine | Admitting: Internal Medicine

## 2020-11-23 DIAGNOSIS — Z95811 Presence of heart assist device: Secondary | ICD-10-CM | POA: Insufficient documentation

## 2020-11-23 DIAGNOSIS — Z7901 Long term (current) use of anticoagulants: Secondary | ICD-10-CM | POA: Diagnosis not present

## 2020-11-23 LAB — LACTATE DEHYDROGENASE: LDH: 359 U/L — ABNORMAL HIGH (ref 98–192)

## 2020-11-23 LAB — PROTIME-INR
INR: 2.6 — ABNORMAL HIGH (ref 0.8–1.2)
Prothrombin Time: 27.7 seconds — ABNORMAL HIGH (ref 11.4–15.2)

## 2020-11-23 NOTE — Progress Notes (Signed)
LVAD INR 

## 2020-12-05 ENCOUNTER — Other Ambulatory Visit (HOSPITAL_COMMUNITY): Payer: Self-pay | Admitting: *Deleted

## 2020-12-05 DIAGNOSIS — Z7901 Long term (current) use of anticoagulants: Secondary | ICD-10-CM

## 2020-12-05 DIAGNOSIS — Z95811 Presence of heart assist device: Secondary | ICD-10-CM

## 2020-12-06 ENCOUNTER — Other Ambulatory Visit: Payer: Self-pay

## 2020-12-06 ENCOUNTER — Ambulatory Visit (HOSPITAL_COMMUNITY): Payer: Self-pay

## 2020-12-06 ENCOUNTER — Encounter (HOSPITAL_COMMUNITY): Payer: Self-pay

## 2020-12-06 ENCOUNTER — Ambulatory Visit (HOSPITAL_COMMUNITY)
Admission: RE | Admit: 2020-12-06 | Discharge: 2020-12-06 | Disposition: A | Discharge status: Home / Self Care | Payer: Medicare Other | Source: Ambulatory Visit | Attending: Cardiology | Admitting: Cardiology

## 2020-12-06 VITALS — BP 102/76 | HR 90 | Wt 196.2 lb

## 2020-12-06 DIAGNOSIS — Z87891 Personal history of nicotine dependence: Secondary | ICD-10-CM | POA: Insufficient documentation

## 2020-12-06 DIAGNOSIS — Z7901 Long term (current) use of anticoagulants: Secondary | ICD-10-CM | POA: Diagnosis not present

## 2020-12-06 DIAGNOSIS — I255 Ischemic cardiomyopathy: Secondary | ICD-10-CM | POA: Insufficient documentation

## 2020-12-06 DIAGNOSIS — Z8674 Personal history of sudden cardiac arrest: Secondary | ICD-10-CM | POA: Insufficient documentation

## 2020-12-06 DIAGNOSIS — Z8249 Family history of ischemic heart disease and other diseases of the circulatory system: Secondary | ICD-10-CM | POA: Diagnosis not present

## 2020-12-06 DIAGNOSIS — Z86718 Personal history of other venous thrombosis and embolism: Secondary | ICD-10-CM | POA: Insufficient documentation

## 2020-12-06 DIAGNOSIS — Z7902 Long term (current) use of antithrombotics/antiplatelets: Secondary | ICD-10-CM | POA: Diagnosis not present

## 2020-12-06 DIAGNOSIS — M459 Ankylosing spondylitis of unspecified sites in spine: Secondary | ICD-10-CM | POA: Diagnosis not present

## 2020-12-06 DIAGNOSIS — Z91128 Patient's intentional underdosing of medication regimen for other reason: Secondary | ICD-10-CM | POA: Insufficient documentation

## 2020-12-06 DIAGNOSIS — I252 Old myocardial infarction: Secondary | ICD-10-CM | POA: Diagnosis not present

## 2020-12-06 DIAGNOSIS — E119 Type 2 diabetes mellitus without complications: Secondary | ICD-10-CM | POA: Diagnosis not present

## 2020-12-06 DIAGNOSIS — I2510 Atherosclerotic heart disease of native coronary artery without angina pectoris: Secondary | ICD-10-CM | POA: Insufficient documentation

## 2020-12-06 DIAGNOSIS — T82598D Other mechanical complication of other cardiac and vascular devices and implants, subsequent encounter: Secondary | ICD-10-CM | POA: Diagnosis not present

## 2020-12-06 DIAGNOSIS — Z95811 Presence of heart assist device: Secondary | ICD-10-CM | POA: Diagnosis not present

## 2020-12-06 DIAGNOSIS — Y838 Other surgical procedures as the cause of abnormal reaction of the patient, or of later complication, without mention of misadventure at the time of the procedure: Secondary | ICD-10-CM | POA: Insufficient documentation

## 2020-12-06 DIAGNOSIS — I110 Hypertensive heart disease with heart failure: Secondary | ICD-10-CM | POA: Diagnosis not present

## 2020-12-06 DIAGNOSIS — Z79899 Other long term (current) drug therapy: Secondary | ICD-10-CM | POA: Diagnosis not present

## 2020-12-06 DIAGNOSIS — F4321 Adjustment disorder with depressed mood: Secondary | ICD-10-CM | POA: Insufficient documentation

## 2020-12-06 DIAGNOSIS — Z86711 Personal history of pulmonary embolism: Secondary | ICD-10-CM | POA: Diagnosis not present

## 2020-12-06 DIAGNOSIS — N200 Calculus of kidney: Secondary | ICD-10-CM | POA: Diagnosis not present

## 2020-12-06 DIAGNOSIS — Z452 Encounter for adjustment and management of vascular access device: Secondary | ICD-10-CM | POA: Diagnosis not present

## 2020-12-06 DIAGNOSIS — Z955 Presence of coronary angioplasty implant and graft: Secondary | ICD-10-CM | POA: Insufficient documentation

## 2020-12-06 DIAGNOSIS — Z5181 Encounter for therapeutic drug level monitoring: Secondary | ICD-10-CM | POA: Insufficient documentation

## 2020-12-06 DIAGNOSIS — I5022 Chronic systolic (congestive) heart failure: Secondary | ICD-10-CM | POA: Diagnosis not present

## 2020-12-06 DIAGNOSIS — T827XXA Infection and inflammatory reaction due to other cardiac and vascular devices, implants and grafts, initial encounter: Secondary | ICD-10-CM

## 2020-12-06 LAB — BASIC METABOLIC PANEL
Anion gap: 7 (ref 5–15)
BUN: 15 mg/dL (ref 8–23)
CO2: 25 mmol/L (ref 22–32)
Calcium: 9.3 mg/dL (ref 8.9–10.3)
Chloride: 104 mmol/L (ref 98–111)
Creatinine, Ser: 1.04 mg/dL (ref 0.61–1.24)
GFR, Estimated: >60 mL/min (ref >60)
Glucose, Bld: 104 mg/dL — ABNORMAL HIGH (ref 70–99)
Potassium: 4.4 mmol/L (ref 3.5–5.1)
Sodium: 136 mmol/L (ref 135–145)

## 2020-12-06 LAB — CBC
HCT: 41.6 % (ref 39.0–52.0)
Hemoglobin: 13 g/dL (ref 13.0–17.0)
MCH: 27.8 pg (ref 26.0–34.0)
MCHC: 31.3 g/dL (ref 30.0–36.0)
MCV: 89.1 fL (ref 80.0–100.0)
Platelets: 366 10*3/uL (ref 150–400)
RBC: 4.67 MIL/uL (ref 4.22–5.81)
RDW: 15.7 % — ABNORMAL HIGH (ref 11.5–15.5)
WBC: 7.9 10*3/uL (ref 4.0–10.5)
nRBC: 0 % (ref 0.0–0.2)

## 2020-12-06 LAB — PROTIME-INR
INR: 2.3 — ABNORMAL HIGH (ref 0.8–1.2)
Prothrombin Time: 24.9 seconds — ABNORMAL HIGH (ref 11.4–15.2)

## 2020-12-06 LAB — LACTATE DEHYDROGENASE: LDH: 266 U/L — ABNORMAL HIGH (ref 98–192)

## 2020-12-06 MED ORDER — CEPHALEXIN 500 MG PO CAPS: 500.0000 mg | ORAL_CAPSULE | Freq: Two times a day (BID) | ORAL | 6 refills | Status: DC

## 2020-12-06 NOTE — Patient Instructions (Signed)
1. No medication changes 2. Coumadin doing per [facility] PharmD 3. Return to clinic in 3 months

## 2020-12-06 NOTE — Progress Notes (Signed)
LVAD INR 

## 2020-12-06 NOTE — Progress Notes (Signed)
Patient presents for 3 month follow up in [facility] today with wife. Reports no problems with VAD equipment and reports DL drainage has decreased and has minimal bleeding.   Denies lightheadedness, dizziness, or falls. Reports shortness of breath with activity, which is not new for him. Reports he is still sleeping poorly.   Reports occasional blood in his urine that he attributes to kidney stones. Denies blood in stool.   Wife reports she is changing DL dressing every 5 days. He will occasionally have bloody drainage after he is active working outside. Some redness noted if he has drainage, but this resolves with through cleaning. Taking Keflex BID.   Pt was provided with refills for Keflex and Trazodone today.   Vital Signs:  Doppler Pressure: 90 Automatc BP: 102/76 (87)  HR: 90 SR w/ occasional PACs SPO2: 96% RA  Weight: 196.2 lb w/o eqt Last weight: 195.6 lbs  VAD Indication: Destination Therapy patient choice    VAD interrogation & Equipment Management: Speed: 9200 Flow: 4.5 Power:  5.2w    PI: 6.1  Alarms: multiple LV advisories Events: rare   Fixed speed 9200 Low speed limit: 8600  Primary controller: Replace back-up battery in 26 months Back up controller: Replace back-up battery in 30 months   Annual Equipment Maintenance on UBC/PM was performed today on 08/30/2020.  I reviewed the LVAD parameters from today and compared the results to the patient's prior recorded data. LVAD interrogation was NEGATIVE for significant power changes, NEGATIVE for clinical alarms and STABLE for PI events/speed drops. No programming changes were made and pump is functioning within specified parameters. Pt is performing daily controller and system monitor self tests along with completing weekly and monthly maintenance for LVAD equipment.  LVAD equipment check completed and is in good working order. Back-up equipment present.   Exit Site Care: Drive line is being maintained  weekly  by his wife, [name].Dressing dry and intact. Stabilization device present and accurately applied. Pt denies fever or chills. 15 anchors, 15 sorbaview dressings, saline wipes, and adhesive remover wipes  provided for home use.   Significant Events on VAD Support:  02/21/17> lap chole  Device:none   BP & Labs:  MAP 90 - Doppler is reflecting map  Hgb 13.0 - No S/S of bleeding. Specifically denies melena/BRBPR or nosebleeds.  LDH  and is within established baseline of 250- 360. Pt denies tea-colored urine. No power elevations noted on interrogation. We will continue to check this with his INR.    Patient Instructions:  1. No medication changes 2. Coumadin doing per [facility] PharmD 3. Return to clinic in 3 months  Emerson Monte RN [place] Coordinator  Office: [phone]  24/7 Pager: [phone]

## 2020-12-06 NOTE — Addendum Note (Signed)
Encounter addended by: [name], MD on: 12/06/2020 11:57 AM  Actions taken: Clinical Note Signed, Level of Service modified, Visit diagnoses modified, Charge Capture section accepted

## 2020-12-06 NOTE — Progress Notes (Addendum)
Patient ID: LAKSH HINNERS, male   DOB: 21-Aug-1945, 75 y.o.   MRN: [id number]     Primary Cardiologist: Dr Haroldine Laws INR : [facility]  [name] is a 75 y/o male with h/o CAD with ankylosing spondylitis, DM2, carotid stenting, severe ischemic CM s/p anterior STEM with VT arrest in 12/15, large PE with pulmonary infarct in 2/15 s/p IVC filter and systolic HF with EF 16%.  He underwent HM II LVAD placement on 09/20/13. He had a prolonged hospital course due to recurrent chest bleeding, severe CO2 retention and RV failure.  In 8/18 admitted with acute gallstone pancreatitis. Underwent lap chole 02/21/17.   Admitted 2/18 with elevated LDH. He was started on bivalirudin. LDH went down [phone].   He presents today for routine visit. Here with his wife. Doing pretty well. Able to do all ADLs without problem. Denies CP, edema or undue SOB. Denies orthopnea or PND. No fevers, chills or problems with driveline. No bleeding, melena or neuro symptoms. No VAD alarms. Taking all meds as prescribed. Occasional bloody drainage from DL exit site. Remains on Keflex.    VAD Indication: Destination Therapy patient choice    VAD interrogation & Equipment Management: Speed: 9200 Flow: 4.5 Power:  5.2w PI: 6.1  Alarms: multiple LV advisories Events: rare   Fixed speed 9200 Low speed limit: 8600  Primary controller: Replace back-up battery in 26 months Back up controller: Replace back-up battery in 30 months   Annual Equipment Maintenance on UBC/PM was performed today on 08/30/2020.  I reviewed the LVAD parameters from todayand compared the results to the patient's prior recorded data.LVAD interrogation was NEGATIVEfor significant power changes, NEGATIVEfor clinicalalarms and STABLEfor PI events/speed drops. No programming changes were madeand pump is functioning within specified parameters. Pt is performing daily controller and system monitor self tests along with completing  weekly and monthly maintenance for LVAD equipment.  LVAD equipment check completed and is in good working order. Back-up equipment present.  SH:  Social History   Socioeconomic History  . Marital status: Married    Spouse name: Not on file  . Number of children: Not on file  . Years of education: Not on file  . Highest education level: Not on file  Occupational History  . Occupation: retired  Tobacco Use  . Smoking status: Former Smoker    Packs/day: 0.30    Years: 0.00    Pack years: 0.00    Types: Cigarettes    Quit date: 06/12/1969    Years since quitting: 51.5  . Smokeless tobacco: Never Used  . 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  Vaping Use  . Vaping Use: Former  Substance and Sexual Activity  . Alcohol use: No    Alcohol/week: 0.0 standard drinks  . Drug use: No  . Sexual activity: Not Currently  Other Topics Concern  . Not on file  Social History Narrative  . Not on file   Social Determinants of Health   Financial Resource Strain: Not on file  Food Insecurity: Not on file  Transportation Needs: Not on file  Physical Activity: Not on file  Stress: Not on file  Social Connections: Not on file  Intimate Partner Violence: Not on file    FH:  Family History  Problem Relation Age of Onset  . Hypertension Father   . Heart attack Father   . Heart Problems Sister     Past Medical History:  Diagnosis Date  . Acute on  chronic respiratory failure ([place])    a. 08/2014 in setting of PE.  Marland Kitchen Ankylosing spondylitis ([place])   . Bilateral pulmonary embolism ([place])    a. 08/2014 - started on Coumadin. Retrievable IVC filter placed 08/27/14 due to RV strain and large clot burden.  Marland Kitchen CAD (coronary artery disease)    a. stenting of LCx 2013; b. STEMI 06/12/14 s/p PCI to LAD complicated by post cath shock requiring IABP; VT s/p DCCV, EF 20%; c. NSTEMI 06/26/14 treated medically.  . Carotid artery disease ([place])    a. s/p stenting.  . Chronic  systolic CHF (congestive heart failure) ([place])   . Diabetes ([place])   . Hemoptysis    a. 08/2014 possibly due to pulm infarct/PE.  Marland Kitchen Hyperlipidemia   . Hypertension   . Hypotension   . Ischemic cardiomyopathy    a. echo 08/23/2014 EF <20%, dilated CM, mod MR/TR  . Leukocytosis   . Leukocytosis 09/10/2014  . Nephrolithiasis   . Pleural effusion on right 08/2014 - small  . Reactive thrombocytosis 09/10/2014  . Right leg DVT ([place])    a. 08/2014.  Marland Kitchen Ventricular tachycardia ([place])    a. 06/2014 at time of MI, s/p DCCV.   Current Outpatient Medications  Medication Sig Dispense Refill  . acetaminophen (TYLENOL) 500 MG tablet Take 1,000 mg by mouth every 6 (six) hours as needed for moderate pain.     Marland Kitchen aspirin EC 81 MG tablet Take 1 tablet (81 mg total) by mouth daily. 90 tablet 3  . clopidogrel (PLAVIX) 75 MG tablet Take 1 tablet (75 mg total) by mouth daily. 90 tablet 3  . cyclobenzaprine (FLEXERIL) 5 MG tablet Take 1 tablet (5 mg total) by mouth 3 (three) times daily as needed for muscle spasms. 90 tablet 5  . furosemide (LASIX) 20 MG tablet Take 1 tablet (20 mg total) by mouth daily. Take 20 mg daily; may take 40 mg if weight gain 120 tablet 3  . pantoprazole (PROTONIX) 40 MG tablet Take 1 tablet (40 mg total) by mouth at bedtime. 90 tablet 11  . potassium chloride SA (KLOR-CON) 20 MEQ tablet Every other day alternate 20 mEq (1 tablet) with 40 mEq (2 tablets) 135 tablet 3  . sildenafil (VIAGRA) 100 MG tablet Take 1 tablet (100 mg total) by mouth daily as needed for erectile dysfunction. 10 tablet 3  . traMADol (ULTRAM) 50 MG tablet Take 2 tablets (100 mg total) by mouth every 6 (six) hours as needed for moderate pain. 120 tablet 3  . traZODone (DESYREL) 100 MG tablet Take 2 tablets (200 mg total) by mouth at bedtime. (Patient taking differently: Take 150 mg by mouth at bedtime.) 90 tablet 3  . warfarin (JANTOVEN) 6 MG tablet TAKE 1 TABLET DAILY EXCEPT TAKE 1 AND 1/2 TABLETS (9  MG) ON WEDNESDAY 100  tablet 11  . cephALEXin (KEFLEX) 500 MG capsule Take 1 capsule (500 mg total) by mouth 2 (two) times daily. 90 capsule 6   No current facility-administered medications for this encounter.    Vital Signs:  Doppler Pressure: 90 Automatc BP: 102/76 (87)  HR: 90 SR w/ occasional PACs SPO2: 96% RA  Weight: 196.2 lb w/o eqt Last weight: 195.6 lbs   Wt Readings from Last 3 Encounters:  12/06/20 89 kg (196 lb 3.2 oz)  06/01/20 89.9 kg (198 lb 3.2 oz)  03/02/20 88.9 kg (196 lb)   Physical Exam: General:  NAD.  HEENT: normal  Neck: supple. JVP not elevated.  Carotids  2+ bilat; no bruits. No lymphadenopathy or thryomegaly appreciated. Cor: LVAD hum.  Lungs: Clear. Abdomen:  soft, nontender, non-distended. No hepatosplenomegaly. No bruits or masses. Good bowel sounds. Driveline site clean. Anchor in place.  Extremities: no cyanosis, clubbing, rash. Warm no edema  Neuro: alert & oriented x 3. No focal deficits. Moves all 4 without problem     ASSESSMENT & PLAN:  1. Chronic systolic HF: Ischemic cardiomyopathy, EF 20% s/p HMII LVAD 09/20/13.   - Doing well with VAD support Remains NYHA II. Now 7 years out - NYHA II. Volume status and MAPs look good -continue current regimen  2. CAD s/p Anterior STEMI on 06/12/14 with stenting of LAD:  - No s/s ischemia - Off ASA. He has stopped statin as he feels it was contributing to his insomnia  3. DL infection, chronic - site ok today. Seems to be under control. Continue Keflex  4 VAD management:  - Had admit in 08/2017 for elevated LDH.  - LDH stabilized 300-380 range. Concern has been for chronic pump clot. LDH 266 today - Hgb 13.0 - Off asa. On coumadin.  - INR 2.3.  Will try to keep INR 2.3- 2.8. Discussed dosing with PharmD personally. - Continue Keflex as above - VA.vadi   5. Ankylosing spondylitis: Off NSAIDS. -  No longer following with rheumatology. Uses tramadol. Avoids NSAIDs as much as possible. -  Stable  6. HTN:  -  MAPs look good  7. Kidney Stones  - CT with renal calculi bilaterally. Asymptomatic. No intervention needed at this time.  Now followed by Dr Tresa Moore at [facility]. - No change  8. Depression - situational. Seems better lately - has refused SSRI  Total time spent 45 minutes. Over half that time spent discussing above.    [name], MD  12/06/2020

## 2020-12-23 ENCOUNTER — Other Ambulatory Visit (HOSPITAL_COMMUNITY): Payer: Self-pay | Admitting: Unknown Physician Specialty

## 2020-12-23 DIAGNOSIS — Z95811 Presence of heart assist device: Secondary | ICD-10-CM

## 2020-12-23 DIAGNOSIS — Z7901 Long term (current) use of anticoagulants: Secondary | ICD-10-CM

## 2020-12-28 ENCOUNTER — Ambulatory Visit (HOSPITAL_COMMUNITY)
Admission: RE | Admit: 2020-12-28 | Discharge: 2020-12-28 | Disposition: A | Discharge status: Home / Self Care | Payer: Medicare Other | Source: Ambulatory Visit | Attending: Internal Medicine | Admitting: Internal Medicine

## 2020-12-28 ENCOUNTER — Ambulatory Visit (HOSPITAL_COMMUNITY): Payer: Self-pay

## 2020-12-28 ENCOUNTER — Other Ambulatory Visit: Payer: Self-pay

## 2020-12-28 DIAGNOSIS — Z7901 Long term (current) use of anticoagulants: Secondary | ICD-10-CM | POA: Diagnosis not present

## 2020-12-28 DIAGNOSIS — Z95811 Presence of heart assist device: Secondary | ICD-10-CM | POA: Insufficient documentation

## 2020-12-28 DIAGNOSIS — Z5181 Encounter for therapeutic drug level monitoring: Secondary | ICD-10-CM | POA: Insufficient documentation

## 2020-12-28 LAB — LACTATE DEHYDROGENASE: LDH: 257 U/L — ABNORMAL HIGH (ref 98–192)

## 2020-12-28 LAB — PROTIME-INR
INR: 2.7 — ABNORMAL HIGH (ref 0.8–1.2)
Prothrombin Time: 28.5 seconds — ABNORMAL HIGH (ref 11.4–15.2)

## 2020-12-28 NOTE — Progress Notes (Signed)
LVAD INR 

## 2021-01-13 ENCOUNTER — Other Ambulatory Visit (HOSPITAL_COMMUNITY): Payer: Self-pay | Admitting: Unknown Physician Specialty

## 2021-01-13 DIAGNOSIS — Z7901 Long term (current) use of anticoagulants: Secondary | ICD-10-CM

## 2021-01-13 DIAGNOSIS — Z95811 Presence of heart assist device: Secondary | ICD-10-CM

## 2021-01-18 ENCOUNTER — Ambulatory Visit (HOSPITAL_COMMUNITY): Payer: Self-pay

## 2021-01-18 ENCOUNTER — Other Ambulatory Visit: Payer: Self-pay

## 2021-01-18 ENCOUNTER — Ambulatory Visit (HOSPITAL_COMMUNITY)
Admission: RE | Admit: 2021-01-18 | Discharge: 2021-01-18 | Disposition: A | Discharge status: Home / Self Care | Payer: Medicare Other | Source: Ambulatory Visit | Attending: Cardiology | Admitting: Cardiology

## 2021-01-18 DIAGNOSIS — Z95811 Presence of heart assist device: Secondary | ICD-10-CM | POA: Diagnosis not present

## 2021-01-18 DIAGNOSIS — Z7901 Long term (current) use of anticoagulants: Secondary | ICD-10-CM | POA: Insufficient documentation

## 2021-01-18 LAB — PROTIME-INR
INR: 2.6 — ABNORMAL HIGH (ref 0.8–1.2)
Prothrombin Time: 28 seconds — ABNORMAL HIGH (ref 11.4–15.2)

## 2021-01-18 LAB — LACTATE DEHYDROGENASE: LDH: 303 U/L — ABNORMAL HIGH (ref 98–192)

## 2021-01-18 NOTE — Progress Notes (Signed)
LVAD INR 

## 2021-02-03 ENCOUNTER — Other Ambulatory Visit (HOSPITAL_COMMUNITY): Payer: Self-pay | Admitting: Unknown Physician Specialty

## 2021-02-03 DIAGNOSIS — Z95811 Presence of heart assist device: Secondary | ICD-10-CM

## 2021-02-03 DIAGNOSIS — Z7901 Long term (current) use of anticoagulants: Secondary | ICD-10-CM

## 2021-02-07 ENCOUNTER — Other Ambulatory Visit (HOSPITAL_COMMUNITY): Payer: Self-pay | Admitting: Internal Medicine

## 2021-02-07 DIAGNOSIS — T827XXA Infection and inflammatory reaction due to other cardiac and vascular devices, implants and grafts, initial encounter: Secondary | ICD-10-CM

## 2021-02-07 DIAGNOSIS — G479 Sleep disorder, unspecified: Secondary | ICD-10-CM

## 2021-02-08 ENCOUNTER — Telehealth (HOSPITAL_COMMUNITY): Payer: Self-pay | Admitting: *Deleted

## 2021-02-08 ENCOUNTER — Ambulatory Visit (HOSPITAL_COMMUNITY): Payer: Self-pay | Admitting: Pharmacist

## 2021-02-08 ENCOUNTER — Ambulatory Visit (HOSPITAL_COMMUNITY)
Admission: RE | Admit: 2021-02-08 | Discharge: 2021-02-08 | Disposition: A | Discharge status: Home / Self Care | Payer: Medicare Other | Source: Ambulatory Visit | Attending: Internal Medicine | Admitting: Internal Medicine

## 2021-02-08 ENCOUNTER — Other Ambulatory Visit: Payer: Self-pay

## 2021-02-08 DIAGNOSIS — Z7901 Long term (current) use of anticoagulants: Secondary | ICD-10-CM | POA: Diagnosis not present

## 2021-02-08 DIAGNOSIS — Z95811 Presence of heart assist device: Secondary | ICD-10-CM | POA: Insufficient documentation

## 2021-02-08 DIAGNOSIS — Z09 Encounter for follow-up examination after completed treatment for conditions other than malignant neoplasm: Secondary | ICD-10-CM | POA: Insufficient documentation

## 2021-02-08 LAB — PROTIME-INR
INR: 2.8 — ABNORMAL HIGH (ref 0.8–1.2)
Prothrombin Time: 29.7 seconds — ABNORMAL HIGH (ref 11.4–15.2)

## 2021-02-08 LAB — LACTATE DEHYDROGENASE: LDH: 419 U/L — ABNORMAL HIGH (ref 98–192)

## 2021-02-08 NOTE — Telephone Encounter (Signed)
Pt had INR and LDH drawn today. LDH 419 (up from 303 last week.) Spoke with patient- denies signs of infection, issues with VAD, or dark urine. Discussed with Dr Haroldine Laws- will plan for repeat labs next week.   Pt aware of lab appt next week for repeat labs. Advised him to call VAD coordinators if signs of infection, dark urine, or any issues with VAD occur. He verbalized understanding.   Emerson Monte RN [place] Coordinator  Office: [phone]  24/7 Pager: [phone]

## 2021-02-08 NOTE — Progress Notes (Signed)
LVAD INR 

## 2021-02-09 ENCOUNTER — Other Ambulatory Visit (HOSPITAL_COMMUNITY): Payer: Self-pay | Admitting: *Deleted

## 2021-02-09 DIAGNOSIS — Z95811 Presence of heart assist device: Secondary | ICD-10-CM

## 2021-02-09 DIAGNOSIS — Z7901 Long term (current) use of anticoagulants: Secondary | ICD-10-CM

## 2021-02-15 ENCOUNTER — Ambulatory Visit (HOSPITAL_COMMUNITY)
Admission: RE | Admit: 2021-02-15 | Discharge: 2021-02-15 | Disposition: A | Discharge status: Home / Self Care | Payer: Medicare Other | Source: Ambulatory Visit | Attending: Cardiology | Admitting: Cardiology

## 2021-02-15 ENCOUNTER — Other Ambulatory Visit: Payer: Self-pay

## 2021-02-15 ENCOUNTER — Ambulatory Visit (HOSPITAL_COMMUNITY): Payer: Self-pay | Admitting: Pharmacist

## 2021-02-15 DIAGNOSIS — Z7901 Long term (current) use of anticoagulants: Secondary | ICD-10-CM | POA: Insufficient documentation

## 2021-02-15 DIAGNOSIS — Z95811 Presence of heart assist device: Secondary | ICD-10-CM | POA: Diagnosis not present

## 2021-02-15 LAB — LACTATE DEHYDROGENASE: LDH: 350 U/L — ABNORMAL HIGH (ref 98–192)

## 2021-02-15 LAB — PROTIME-INR
INR: 2.3 — ABNORMAL HIGH (ref 0.8–1.2)
Prothrombin Time: 25.1 seconds — ABNORMAL HIGH (ref 11.4–15.2)

## 2021-02-15 NOTE — Progress Notes (Signed)
LVAD INR 

## 2021-02-24 ENCOUNTER — Other Ambulatory Visit (HOSPITAL_COMMUNITY): Payer: Self-pay | Admitting: Unknown Physician Specialty

## 2021-02-24 DIAGNOSIS — Z95811 Presence of heart assist device: Secondary | ICD-10-CM

## 2021-02-24 DIAGNOSIS — Z7901 Long term (current) use of anticoagulants: Secondary | ICD-10-CM

## 2021-03-01 ENCOUNTER — Other Ambulatory Visit (HOSPITAL_COMMUNITY): Payer: Medicare Other

## 2021-03-01 ENCOUNTER — Ambulatory Visit (HOSPITAL_COMMUNITY): Payer: Self-pay | Admitting: Pharmacist

## 2021-03-01 ENCOUNTER — Other Ambulatory Visit: Payer: Self-pay

## 2021-03-01 ENCOUNTER — Ambulatory Visit (HOSPITAL_COMMUNITY)
Admission: RE | Admit: 2021-03-01 | Discharge: 2021-03-01 | Disposition: A | Discharge status: Home / Self Care | Payer: Medicare Other | Source: Ambulatory Visit | Attending: Cardiology | Admitting: Cardiology

## 2021-03-01 DIAGNOSIS — Z7901 Long term (current) use of anticoagulants: Secondary | ICD-10-CM | POA: Diagnosis not present

## 2021-03-01 DIAGNOSIS — Z95811 Presence of heart assist device: Secondary | ICD-10-CM | POA: Diagnosis not present

## 2021-03-01 LAB — LACTATE DEHYDROGENASE: LDH: 369 U/L — ABNORMAL HIGH (ref 98–192)

## 2021-03-01 LAB — PROTIME-INR
INR: 2.7 — ABNORMAL HIGH (ref 0.8–1.2)
Prothrombin Time: 28.9 seconds — ABNORMAL HIGH (ref 11.4–15.2)

## 2021-03-01 NOTE — Progress Notes (Signed)
LVAD INR 

## 2021-03-14 ENCOUNTER — Other Ambulatory Visit (HOSPITAL_COMMUNITY): Payer: Self-pay | Admitting: Unknown Physician Specialty

## 2021-03-14 DIAGNOSIS — Z95811 Presence of heart assist device: Secondary | ICD-10-CM

## 2021-03-14 DIAGNOSIS — I255 Ischemic cardiomyopathy: Secondary | ICD-10-CM

## 2021-03-14 DIAGNOSIS — Z7901 Long term (current) use of anticoagulants: Secondary | ICD-10-CM

## 2021-03-15 ENCOUNTER — Other Ambulatory Visit: Payer: Self-pay

## 2021-03-15 ENCOUNTER — Ambulatory Visit (HOSPITAL_COMMUNITY): Payer: Self-pay | Admitting: Pharmacist

## 2021-03-15 ENCOUNTER — Ambulatory Visit (HOSPITAL_COMMUNITY)
Admission: RE | Admit: 2021-03-15 | Discharge: 2021-03-15 | Disposition: A | Discharge status: Home / Self Care | Payer: Medicare Other | Source: Ambulatory Visit | Attending: Cardiology | Admitting: Cardiology

## 2021-03-15 VITALS — BP 81/48 | HR 71 | Temp 97.6°F | Ht 68.0 in | Wt 195.0 lb

## 2021-03-15 DIAGNOSIS — R0600 Dyspnea, unspecified: Secondary | ICD-10-CM | POA: Diagnosis not present

## 2021-03-15 DIAGNOSIS — G479 Sleep disorder, unspecified: Secondary | ICD-10-CM | POA: Diagnosis not present

## 2021-03-15 DIAGNOSIS — I480 Paroxysmal atrial fibrillation: Secondary | ICD-10-CM

## 2021-03-15 DIAGNOSIS — R21 Rash and other nonspecific skin eruption: Secondary | ICD-10-CM | POA: Diagnosis not present

## 2021-03-15 DIAGNOSIS — Z79899 Other long term (current) drug therapy: Secondary | ICD-10-CM | POA: Diagnosis not present

## 2021-03-15 DIAGNOSIS — I5023 Acute on chronic systolic (congestive) heart failure: Secondary | ICD-10-CM

## 2021-03-15 DIAGNOSIS — Z7901 Long term (current) use of anticoagulants: Secondary | ICD-10-CM

## 2021-03-15 DIAGNOSIS — I255 Ischemic cardiomyopathy: Secondary | ICD-10-CM | POA: Diagnosis not present

## 2021-03-15 DIAGNOSIS — Z95811 Presence of heart assist device: Secondary | ICD-10-CM | POA: Diagnosis not present

## 2021-03-15 DIAGNOSIS — I5041 Acute combined systolic (congestive) and diastolic (congestive) heart failure: Secondary | ICD-10-CM | POA: Diagnosis not present

## 2021-03-15 DIAGNOSIS — R0609 Other forms of dyspnea: Secondary | ICD-10-CM

## 2021-03-15 DIAGNOSIS — I5022 Chronic systolic (congestive) heart failure: Secondary | ICD-10-CM | POA: Diagnosis not present

## 2021-03-15 LAB — LIPID PANEL
Cholesterol: 210 mg/dL — ABNORMAL HIGH (ref 0–200)
HDL: 41 mg/dL (ref >40)
LDL Cholesterol: 160 mg/dL — ABNORMAL HIGH (ref 0–99)
Total CHOL/HDL Ratio: 5.1 RATIO
Triglycerides: 43 mg/dL (ref <150)
VLDL: 9 mg/dL (ref 0–40)

## 2021-03-15 LAB — CBC
HCT: 43.1 % (ref 39.0–52.0)
Hemoglobin: 13.4 g/dL (ref 13.0–17.0)
MCH: 27.3 pg (ref 26.0–34.0)
MCHC: 31.1 g/dL (ref 30.0–36.0)
MCV: 87.8 fL (ref 80.0–100.0)
Platelets: 370 10*3/uL (ref 150–400)
RBC: 4.91 MIL/uL (ref 4.22–5.81)
RDW: 15.9 % — ABNORMAL HIGH (ref 11.5–15.5)
WBC: 8.2 10*3/uL (ref 4.0–10.5)
nRBC: 0 % (ref 0.0–0.2)

## 2021-03-15 LAB — LACTATE DEHYDROGENASE: LDH: 274 U/L — ABNORMAL HIGH (ref 98–192)

## 2021-03-15 LAB — COMPREHENSIVE METABOLIC PANEL
ALT: 25 U/L (ref 0–44)
AST: 30 U/L (ref 15–41)
Albumin: 4 g/dL (ref 3.5–5.0)
Alkaline Phosphatase: 101 U/L (ref 38–126)
Anion gap: 8 (ref 5–15)
BUN: 15 mg/dL (ref 8–23)
CO2: 24 mmol/L (ref 22–32)
Calcium: 9 mg/dL (ref 8.9–10.3)
Chloride: 103 mmol/L (ref 98–111)
Creatinine, Ser: 1.05 mg/dL (ref 0.61–1.24)
GFR, Estimated: >60 mL/min (ref >60)
Glucose, Bld: 104 mg/dL — ABNORMAL HIGH (ref 70–99)
Potassium: 4.3 mmol/L (ref 3.5–5.1)
Sodium: 135 mmol/L (ref 135–145)
Total Bilirubin: 0.9 mg/dL (ref 0.3–1.2)
Total Protein: 8 g/dL (ref 6.5–8.1)

## 2021-03-15 LAB — PROTIME-INR
INR: 2.6 — ABNORMAL HIGH (ref 0.8–1.2)
Prothrombin Time: 27.7 seconds — ABNORMAL HIGH (ref 11.4–15.2)

## 2021-03-15 MED ORDER — CLOBETASOL PROPIONATE 0.05 % EX OINT: 1.0000 "application " | TOPICAL_OINTMENT | Freq: Two times a day (BID) | CUTANEOUS | 2 refills | Status: DC

## 2021-03-15 MED ORDER — POTASSIUM CHLORIDE CRYS ER 20 MEQ PO TBCR: EXTENDED_RELEASE_TABLET | ORAL | 3 refills | Status: DC

## 2021-03-15 MED ORDER — FUROSEMIDE 20 MG PO TABS
20.0000 mg | ORAL_TABLET | Freq: Every day | ORAL | 3 refills | Status: DC
Start: 2021-03-15 — End: 2021-10-27

## 2021-03-15 NOTE — Patient Instructions (Signed)
No change in medications. PharmD will call you with INR results and warfarin dosing. Return to [facility] in 3 months.

## 2021-03-15 NOTE — Progress Notes (Signed)
LVAD INR 

## 2021-03-15 NOTE — Progress Notes (Signed)
Patient presents for 3 month follow up and 6.5 yr Intermacs in [facility] today with wife. Reports no problems with VAD equipment or drive line.   Patient reports he feels "pretty good" overall and denies any specific complaints.   Wife requested fasting lipid panel today. Dr. Haroldine Laws discussed at length several lipid therapies; pt will think about these.   Vital Signs:  Temp: 97.6 Doppler Pressure: 70 Automatc BP: 87/48 (75) HR: 71 SPO2: 97% RA   Weight: 195 lb w/o eqt Last weight: 196.2 lbs  VAD Indication: Destination Therapy patient choice     VAD interrogation & Equipment Management: Speed: 9200 Flow: 4.5 Power:  5.2w    PI: 6.2   Alarms: multiple LV advisories Events: rare   Fixed speed 9200 Low speed limit: 8600   Primary controller: Replace back-up battery in 26 months Back up controller: Replace back-up battery in 21 months   Annual Equipment Maintenance on UBC/PM was performed on 08/30/2020.   I reviewed the LVAD parameters from today and compared the results to the patient's prior recorded data. LVAD interrogation was NEGATIVE for significant power changes, NEGATIVE for clinical alarms and STABLE for PI events/speed drops. No programming changes were made and pump is functioning within specified parameters. Pt is performing daily controller and system monitor self tests along with completing weekly and monthly maintenance for LVAD equipment.   LVAD equipment check completed and is in good working order. Back-up equipment present.    Exit Site Care: Drive line is being maintained weekly or as needed by his wife using daily dressing kits. Dressing dry and intact. Stabilization device present and accurately applied. Pt denies fever or chills. 15 anchors and 14 daily dressing kits provided for home use.   Significant Events on VAD Support:  02/21/17> lap chole   Device: none   BP & Labs:  MAP 70 - Doppler is reflecting map   Hgb 13.4 - No S/S of bleeding.  Specifically denies melena/BRBPR or nosebleeds.   LDH 274 and is within established baseline of 250- 360. Pt denies tea-colored urine. No power elevations noted on interrogation. We will continue to check this with his INR.   6.5 year Intermacs follow up completed including:  Quality of Life and KCCQ-12. Pt did not attempt Neurocognitive trail making due to "vision" problems.   Pt unable to perform 6 minute walk.  [place] Cardiomyopathy Questionnaire  KCCQ-12 03/15/2021 08/30/2020 03/02/2020  1 a. Ability to shower/bathe Quite a bit limited Slightly limited Quite a bit limited  1 b. Ability to walk 1 block Quite a bit limited Quite a bit limited Moderately limited  1 c. Ability to hurry/jog Extremely limited Extremely limited Extremely limited  2. Edema feet/ankles/legs Never over the past 2 weeks Never over the past 2 weeks Never over the past 2 weeks  3. Limited by fatigue Several times a day All of the time All of the time  4. Limited by dyspnea Several times a day All of the time All of the time  5. Sitting up / on 3+ pillows Never over the past 2 weeks Never over the past 2 weeks Never over the past 2 weeks  6. Limited enjoyment of life Moderately limited Moderately limited Limited quite a bit  7. Rest of life w/ symptoms Mostly dissatisfied Mostly dissatisfied Mostly dissatisfied  8 a. Participation in hobbies Limited quite a bit Limited quite a bit Limited quite a bit  8 b. Participation in chores Limited quite a bit  Limited quite a bit Limited quite a bit  8 c. Visiting family/friends Moderately limited Moderately limited Moderately limited      Back up controller: 11V backup battery charged yesterday per wife; she reports charging once per month.    Patient Instructions:  No change in medications. PharmD will call you with INR results and warfarin dosing. Return to [facility] in 3 months.   Zada Girt RN [place] Coordinator  Office: [phone]  24/7 Pager: [phone]

## 2021-03-19 NOTE — Progress Notes (Signed)
Patient ID: Scott [name], male   DOB: 09-10-1945, 75 y.o.   MRN: [id number]     Primary Cardiologist: Dr Haroldine Laws INR : [facility]  HPI:  Scott [name] is a 75 y/o male with h/o CAD with ankylosing spondylitis, DM2, carotid stenting, severe ischemic CM s/p anterior STEM with VT arrest in 12/15, large PE with pulmonary infarct in 2/15 s/p IVC filter and systolic HF with EF [id number].  He underwent HM II LVAD placement on 09/20/13. He had a prolonged hospital course due to recurrent chest bleeding, severe CO2 retention and RV failure.  In 8/18 admitted with acute gallstone pancreatitis. Underwent lap chole 02/21/17.   Admitted 2/18 with elevated LDH. He was started on bivalirudin. LDH went down [phone].   He presents today for routine visit. Here with his wife. Remains stable. Able to do all activities without limitation. DL site improved with Keflex. Says he can't tolerate statin and prefers not to take. Denies orthopnea or PND. No fevers, chills or problems with driveline. No bleeding, melena or neuro symptoms. No VAD alarms.    VAD Indication: Destination Therapy patient choice     VAD interrogation & Equipment Management: Speed: 9200 Flow: 4.5 Power:  5.2w    PI: 6.2   Alarms: multiple LV advisories Events: rare   Fixed speed 9200 Low speed limit: 8600   Primary controller: Replace back-up battery in 26 months Back up controller: Replace back-up battery in 21 months    Annual Equipment Maintenance on UBC/PM was performed on 08/30/2020.   I reviewed the LVAD parameters from today and compared the results to the patient's prior recorded data. LVAD interrogation was NEGATIVE for significant power changes, NEGATIVE for clinical alarms and STABLE for PI events/speed drops. No programming changes were made and pump is functioning within specified parameters. Pt is performing daily controller and system monitor self tests along with completing weekly and monthly maintenance for LVAD  equipment.   LVAD equipment check completed and is in good working order. Back-up equipment present.  SH:  Social History   Socioeconomic History   Marital status: Married    Spouse name: Not on file   Number of children: Not on file   Years of education: Not on file   Highest education level: Not on file  Occupational History   Occupation: retired  Tobacco Use   Smoking status: Former    Packs/day: 0.30    Years: 0.00    Pack years: 0.00    Types: Cigarettes    Quit date: 06/12/1969    Years since quitting: 51.8   Smokeless tobacco: Never   Tobacco comments:    pt smoked while in [employer] 2-3 cig x 6 months.  Vaping Use   Vaping Use: Former  Substance and Sexual Activity   Alcohol use: No    Alcohol/week: 0.0 standard drinks   Drug use: No   Sexual activity: Not Currently  Other Topics Concern   Not on file  Social History Narrative   Not on file   Social Determinants of Health   Financial Resource Strain: Not on file  Food Insecurity: Not on file  Transportation Needs: Not on file  Physical Activity: Not on file  Stress: Not on file  Social Connections: Not on file  Intimate Partner Violence: Not on file    FH:  Family History  Problem Relation Age of Onset   Hypertension Father    Heart attack Father    Heart Problems Sister  Past Medical History:  Diagnosis Date   Acute on chronic respiratory failure ([place])    a. 08/2014 in setting of PE.   Ankylosing spondylitis ([place])    Bilateral pulmonary embolism ([place])    a. 08/2014 - started on Coumadin. Retrievable IVC filter placed 08/27/14 due to RV strain and large clot burden.   CAD (coronary artery disease)    a. stenting of LCx 2013; b. STEMI 06/12/14 s/p PCI to LAD complicated by post cath shock requiring IABP; VT s/p DCCV, EF 20%; c. NSTEMI 06/26/14 treated medically.   Carotid artery disease ([place])    a. s/p stenting.   Chronic systolic CHF (congestive heart failure) ([place])    Diabetes ([place])     Hemoptysis    a. 08/2014 possibly due to pulm infarct/PE.   Hyperlipidemia    Hypertension    Hypotension    Ischemic cardiomyopathy    a. echo 08/23/2014 EF <20%, dilated CM, mod MR/TR   Leukocytosis    Leukocytosis 09/10/2014   Nephrolithiasis    Pleural effusion on right 08/2014 - small   Reactive thrombocytosis 09/10/2014   Right leg DVT ([place])    a. 08/2014.   Ventricular tachycardia ([place])    a. 06/2014 at time of MI, s/p DCCV.   Current Outpatient Medications  Medication Sig Dispense Refill   acetaminophen (TYLENOL) 500 MG tablet Take 1,000 mg by mouth every 6 (six) hours as needed for moderate pain.      aspirin EC 81 MG tablet Take 1 tablet (81 mg total) by mouth daily. 90 tablet 3   cephALEXin (KEFLEX) 500 MG capsule Take 1 capsule (500 mg total) by mouth 2 (two) times daily. 90 capsule 6   clobetasol ointment (TEMOVATE) [id number] % Apply 1 application topically 2 (two) times daily. 30 g 2   clopidogrel (PLAVIX) 75 MG tablet Take 1 tablet (75 mg total) by mouth daily. 90 tablet 3   cyclobenzaprine (FLEXERIL) 5 MG tablet Take 1 tablet (5 mg total) by mouth 3 (three) times daily as needed for muscle spasms. 90 tablet 5   pantoprazole (PROTONIX) 40 MG tablet Take 1 tablet (40 mg total) by mouth at bedtime. 90 tablet 11   sildenafil (VIAGRA) 100 MG tablet Take 1 tablet (100 mg total) by mouth daily as needed for erectile dysfunction. 10 tablet 3   traMADol (ULTRAM) 50 MG tablet Take 2 tablets (100 mg total) by mouth every 6 (six) hours as needed for moderate pain. 120 tablet 3   traZODone (DESYREL) 100 MG tablet TAKE 2 TABLETS (200 MG TOTAL) BY MOUTH AT BEDTIME. 90 tablet 3   warfarin (JANTOVEN) 6 MG tablet TAKE 1 TABLET DAILY EXCEPT TAKE 1 AND 1/2 TABLETS (9  MG) ON WEDNESDAY 100 tablet 11   furosemide (LASIX) 20 MG tablet Take 1 tablet (20 mg total) by mouth daily. Take 20 mg daily; may take 40 mg if weight gain 120 tablet 3   potassium chloride SA (KLOR-CON) 20 MEQ tablet Every other day  alternate 20 mEq (1 tablet) with 40 mEq (2 tablets) 135 tablet 3   No current facility-administered medications for this encounter.     Vital Signs:  Temp: 97.6 Doppler Pressure: 70 Automatc BP: 87/48 (75) HR: 71 SPO2: 97% RA   Weight: 195 lb w/o eqt Last weight: 196.2 lbs   Wt Readings from Last 3 Encounters:  03/15/21 88.5 kg (195 lb)  12/06/20 89 kg (196 lb 3.2 oz)  06/01/20 89.9 kg (198 lb 3.2 oz)  Physical Exam: General:  NAD.  HEENT: normal  Neck: supple. JVP not elevated.  Carotids 2+ bilat; no bruits. No lymphadenopathy or thryomegaly appreciated. Cor: LVAD hum.  Lungs: Clear. Abdomen  soft, nontender, non-distended. No hepatosplenomegaly. No bruits or masses. Good bowel sounds. Driveline site clean. Anchor in place.  Extremities: no cyanosis, clubbing, rash. Warm no edema  Neuro: alert & oriented x 3. No focal deficits. Moves all 4 without problem   ASSESSMENT & PLAN:  1. Chronic systolic HF: Ischemic cardiomyopathy, EF 20% s/p HMII LVAD 09/20/13.   - Doing well. NYHA II I. Now 7.5 years out - Volume status and MAPs look good. Weight stable  2. CAD s/p Anterior STEMI on 06/12/14 with stenting of LAD:  - No s/s ischemia - Off ASA. He has stopped statin he says he cannot tolerate. We discussed several options including injectable agents but he is not interested. We will check lipids with labs today.   3. DL infection, chronic - site looks good today. Continue Keflex  4 VAD management:  - Had admit in 08/2017 for elevated LDH.  - LDH stabilized 300-380 range. Concern has been for chronic pump clot. LDH 274 today - Hgb 13.4 - Off asa. On coumadin.  - INR 2.6.  Will try to keep INR 2.3- 2.8. Discussed dosing with PharmD personally. - Continue Keflex as above - VAD interrogated personally. Parameters stable.   5. Ankylosing spondylitis: Off NSAIDS. -  No longer following with rheumatology. Uses tramadol. Avoids NSAIDs as much as possible. -  No change  6.  HTN:  - MAPs ok   7. Kidney Stones  - CT with renal calculi bilaterally. Asymptomatic. No intervention needed at this time.  Now followed by Dr Tresa Moore at [facility]. - No change  8. Depression - situational. Seems better lately. Mostly just grumpy.  - has refused SSRI - no change.   Total time spent 35 minutes. Over half that time spent discussing above.    [name], MD  03/19/2021

## 2021-03-31 ENCOUNTER — Other Ambulatory Visit (HOSPITAL_COMMUNITY): Payer: Self-pay | Admitting: Unknown Physician Specialty

## 2021-03-31 DIAGNOSIS — Z95811 Presence of heart assist device: Secondary | ICD-10-CM

## 2021-03-31 DIAGNOSIS — Z7901 Long term (current) use of anticoagulants: Secondary | ICD-10-CM

## 2021-04-05 ENCOUNTER — Ambulatory Visit (HOSPITAL_COMMUNITY): Payer: Self-pay | Admitting: Pharmacist

## 2021-04-05 ENCOUNTER — Other Ambulatory Visit: Payer: Self-pay

## 2021-04-05 ENCOUNTER — Ambulatory Visit (HOSPITAL_COMMUNITY)
Admission: RE | Admit: 2021-04-05 | Discharge: 2021-04-05 | Disposition: A | Discharge status: Home / Self Care | Payer: Medicare Other | Source: Ambulatory Visit | Attending: Cardiology | Admitting: Cardiology

## 2021-04-05 DIAGNOSIS — Z95811 Presence of heart assist device: Secondary | ICD-10-CM | POA: Diagnosis not present

## 2021-04-05 DIAGNOSIS — Z7901 Long term (current) use of anticoagulants: Secondary | ICD-10-CM | POA: Diagnosis not present

## 2021-04-05 LAB — PROTIME-INR
INR: 3.1 — ABNORMAL HIGH (ref 0.8–1.2)
Prothrombin Time: 31.7 seconds — ABNORMAL HIGH (ref 11.4–15.2)

## 2021-04-05 LAB — LACTATE DEHYDROGENASE: LDH: 345 U/L — ABNORMAL HIGH (ref 98–192)

## 2021-04-05 NOTE — Progress Notes (Signed)
LVAD INR 

## 2021-04-18 ENCOUNTER — Other Ambulatory Visit (HOSPITAL_COMMUNITY): Payer: Self-pay

## 2021-04-20 ENCOUNTER — Other Ambulatory Visit (HOSPITAL_COMMUNITY): Payer: Self-pay | Admitting: *Deleted

## 2021-04-20 DIAGNOSIS — Z7901 Long term (current) use of anticoagulants: Secondary | ICD-10-CM

## 2021-04-20 DIAGNOSIS — Z95811 Presence of heart assist device: Secondary | ICD-10-CM

## 2021-04-25 ENCOUNTER — Other Ambulatory Visit: Payer: Self-pay

## 2021-04-25 ENCOUNTER — Ambulatory Visit (HOSPITAL_COMMUNITY)
Admission: RE | Admit: 2021-04-25 | Discharge: 2021-04-25 | Disposition: A | Discharge status: Home / Self Care | Payer: Medicare Other | Source: Ambulatory Visit | Attending: Cardiology | Admitting: Cardiology

## 2021-04-25 ENCOUNTER — Ambulatory Visit (HOSPITAL_COMMUNITY): Payer: Self-pay | Admitting: Pharmacist

## 2021-04-25 DIAGNOSIS — Z95811 Presence of heart assist device: Secondary | ICD-10-CM | POA: Insufficient documentation

## 2021-04-25 DIAGNOSIS — Z7901 Long term (current) use of anticoagulants: Secondary | ICD-10-CM | POA: Insufficient documentation

## 2021-04-25 LAB — LACTATE DEHYDROGENASE: LDH: 298 U/L — ABNORMAL HIGH (ref 98–192)

## 2021-04-25 LAB — PROTIME-INR
INR: 3.1 — ABNORMAL HIGH (ref 0.8–1.2)
Prothrombin Time: 31.6 seconds — ABNORMAL HIGH (ref 11.4–15.2)

## 2021-04-25 NOTE — Progress Notes (Signed)
LVAD INR 

## 2021-05-11 ENCOUNTER — Other Ambulatory Visit (HOSPITAL_COMMUNITY): Payer: Self-pay | Admitting: *Deleted

## 2021-05-11 DIAGNOSIS — Z7901 Long term (current) use of anticoagulants: Secondary | ICD-10-CM

## 2021-05-11 DIAGNOSIS — Z95811 Presence of heart assist device: Secondary | ICD-10-CM

## 2021-05-17 ENCOUNTER — Other Ambulatory Visit: Payer: Self-pay

## 2021-05-17 ENCOUNTER — Ambulatory Visit (HOSPITAL_COMMUNITY)
Admission: RE | Admit: 2021-05-17 | Discharge: 2021-05-17 | Disposition: A | Discharge status: Home / Self Care | Payer: Medicare Other | Source: Ambulatory Visit | Attending: Cardiology | Admitting: Cardiology

## 2021-05-17 ENCOUNTER — Ambulatory Visit (HOSPITAL_COMMUNITY): Payer: Self-pay | Admitting: Pharmacist

## 2021-05-17 DIAGNOSIS — Z7901 Long term (current) use of anticoagulants: Secondary | ICD-10-CM | POA: Insufficient documentation

## 2021-05-17 DIAGNOSIS — Z95811 Presence of heart assist device: Secondary | ICD-10-CM | POA: Insufficient documentation

## 2021-05-17 LAB — LACTATE DEHYDROGENASE: LDH: 268 U/L — ABNORMAL HIGH (ref 98–192)

## 2021-05-17 LAB — PROTIME-INR
INR: 3 — ABNORMAL HIGH (ref 0.8–1.2)
Prothrombin Time: 31.3 seconds — ABNORMAL HIGH (ref 11.4–15.2)

## 2021-05-17 NOTE — Progress Notes (Signed)
LVAD INR 

## 2021-05-26 ENCOUNTER — Other Ambulatory Visit (HOSPITAL_COMMUNITY): Payer: Self-pay | Admitting: *Deleted

## 2021-05-26 DIAGNOSIS — Z95811 Presence of heart assist device: Secondary | ICD-10-CM

## 2021-05-26 DIAGNOSIS — Z7901 Long term (current) use of anticoagulants: Secondary | ICD-10-CM

## 2021-06-01 ENCOUNTER — Ambulatory Visit (HOSPITAL_COMMUNITY): Payer: Self-pay | Admitting: Pharmacist

## 2021-06-01 ENCOUNTER — Ambulatory Visit (HOSPITAL_COMMUNITY)
Admission: RE | Admit: 2021-06-01 | Discharge: 2021-06-01 | Disposition: A | Discharge status: Home / Self Care | Payer: Medicare Other | Source: Ambulatory Visit | Attending: Internal Medicine | Admitting: Internal Medicine

## 2021-06-01 ENCOUNTER — Other Ambulatory Visit: Payer: Self-pay

## 2021-06-01 DIAGNOSIS — Z95811 Presence of heart assist device: Secondary | ICD-10-CM | POA: Insufficient documentation

## 2021-06-01 DIAGNOSIS — Z5181 Encounter for therapeutic drug level monitoring: Secondary | ICD-10-CM | POA: Insufficient documentation

## 2021-06-01 DIAGNOSIS — Z7901 Long term (current) use of anticoagulants: Secondary | ICD-10-CM | POA: Diagnosis not present

## 2021-06-01 LAB — PROTIME-INR
INR: 2.7 — ABNORMAL HIGH (ref 0.8–1.2)
Prothrombin Time: 28.9 seconds — ABNORMAL HIGH (ref 11.4–15.2)

## 2021-06-01 LAB — LACTATE DEHYDROGENASE: LDH: 347 U/L — ABNORMAL HIGH (ref 98–192)

## 2021-06-01 NOTE — Progress Notes (Signed)
LVAD INR 

## 2021-06-12 ENCOUNTER — Other Ambulatory Visit (HOSPITAL_COMMUNITY): Payer: Self-pay | Admitting: *Deleted

## 2021-06-12 DIAGNOSIS — Z7901 Long term (current) use of anticoagulants: Secondary | ICD-10-CM

## 2021-06-12 DIAGNOSIS — Z95811 Presence of heart assist device: Secondary | ICD-10-CM

## 2021-06-14 ENCOUNTER — Ambulatory Visit (HOSPITAL_COMMUNITY)
Admission: RE | Admit: 2021-06-14 | Discharge: 2021-06-14 | Disposition: A | Discharge status: Home / Self Care | Payer: Medicare Other | Source: Ambulatory Visit | Attending: Cardiology | Admitting: Cardiology

## 2021-06-14 ENCOUNTER — Encounter (HOSPITAL_COMMUNITY): Payer: Self-pay

## 2021-06-14 ENCOUNTER — Other Ambulatory Visit: Payer: Self-pay

## 2021-06-14 ENCOUNTER — Ambulatory Visit (HOSPITAL_COMMUNITY): Payer: Self-pay | Admitting: Pharmacist

## 2021-06-14 DIAGNOSIS — I255 Ischemic cardiomyopathy: Secondary | ICD-10-CM | POA: Diagnosis not present

## 2021-06-14 DIAGNOSIS — I252 Old myocardial infarction: Secondary | ICD-10-CM | POA: Diagnosis not present

## 2021-06-14 DIAGNOSIS — Z79899 Other long term (current) drug therapy: Secondary | ICD-10-CM | POA: Insufficient documentation

## 2021-06-14 DIAGNOSIS — E119 Type 2 diabetes mellitus without complications: Secondary | ICD-10-CM | POA: Diagnosis not present

## 2021-06-14 DIAGNOSIS — T8140XA Infection following a procedure, unspecified, initial encounter: Secondary | ICD-10-CM | POA: Diagnosis not present

## 2021-06-14 DIAGNOSIS — Z95811 Presence of heart assist device: Secondary | ICD-10-CM | POA: Diagnosis not present

## 2021-06-14 DIAGNOSIS — F32A Depression, unspecified: Secondary | ICD-10-CM | POA: Diagnosis not present

## 2021-06-14 DIAGNOSIS — I110 Hypertensive heart disease with heart failure: Secondary | ICD-10-CM | POA: Insufficient documentation

## 2021-06-14 DIAGNOSIS — Z955 Presence of coronary angioplasty implant and graft: Secondary | ICD-10-CM | POA: Insufficient documentation

## 2021-06-14 DIAGNOSIS — I2510 Atherosclerotic heart disease of native coronary artery without angina pectoris: Secondary | ICD-10-CM | POA: Diagnosis not present

## 2021-06-14 DIAGNOSIS — M459 Ankylosing spondylitis of unspecified sites in spine: Secondary | ICD-10-CM | POA: Insufficient documentation

## 2021-06-14 DIAGNOSIS — I5041 Acute combined systolic (congestive) and diastolic (congestive) heart failure: Secondary | ICD-10-CM

## 2021-06-14 DIAGNOSIS — R0609 Other forms of dyspnea: Secondary | ICD-10-CM

## 2021-06-14 DIAGNOSIS — G479 Sleep disorder, unspecified: Secondary | ICD-10-CM

## 2021-06-14 DIAGNOSIS — Z7901 Long term (current) use of anticoagulants: Secondary | ICD-10-CM | POA: Insufficient documentation

## 2021-06-14 DIAGNOSIS — N200 Calculus of kidney: Secondary | ICD-10-CM | POA: Diagnosis not present

## 2021-06-14 DIAGNOSIS — I5022 Chronic systolic (congestive) heart failure: Secondary | ICD-10-CM | POA: Insufficient documentation

## 2021-06-14 DIAGNOSIS — Z8674 Personal history of sudden cardiac arrest: Secondary | ICD-10-CM | POA: Insufficient documentation

## 2021-06-14 DIAGNOSIS — Y838 Other surgical procedures as the cause of abnormal reaction of the patient, or of later complication, without mention of misadventure at the time of the procedure: Secondary | ICD-10-CM | POA: Diagnosis not present

## 2021-06-14 DIAGNOSIS — Z86711 Personal history of pulmonary embolism: Secondary | ICD-10-CM | POA: Diagnosis not present

## 2021-06-14 DIAGNOSIS — I5023 Acute on chronic systolic (congestive) heart failure: Secondary | ICD-10-CM

## 2021-06-14 LAB — CBC
HCT: 38.3 % — ABNORMAL LOW (ref 39.0–52.0)
Hemoglobin: 11.8 g/dL — ABNORMAL LOW (ref 13.0–17.0)
MCH: 25.8 pg — ABNORMAL LOW (ref 26.0–34.0)
MCHC: 30.8 g/dL (ref 30.0–36.0)
MCV: 83.6 fL (ref 80.0–100.0)
Platelets: 397 10*3/uL (ref 150–400)
RBC: 4.58 MIL/uL (ref 4.22–5.81)
RDW: 16.7 % — ABNORMAL HIGH (ref 11.5–15.5)
WBC: 9.4 10*3/uL (ref 4.0–10.5)
nRBC: 0 % (ref 0.0–0.2)

## 2021-06-14 LAB — PROTIME-INR
INR: 3 — ABNORMAL HIGH (ref 0.8–1.2)
Prothrombin Time: 30.7 seconds — ABNORMAL HIGH (ref 11.4–15.2)

## 2021-06-14 LAB — BASIC METABOLIC PANEL
Anion gap: 6 (ref 5–15)
BUN: 16 mg/dL (ref 8–23)
CO2: 26 mmol/L (ref 22–32)
Calcium: 9.4 mg/dL (ref 8.9–10.3)
Chloride: 106 mmol/L (ref 98–111)
Creatinine, Ser: 1.11 mg/dL (ref 0.61–1.24)
GFR, Estimated: >60 mL/min (ref >60)
Glucose, Bld: 96 mg/dL (ref 70–99)
Potassium: 4.5 mmol/L (ref 3.5–5.1)
Sodium: 138 mmol/L (ref 135–145)

## 2021-06-14 LAB — LACTATE DEHYDROGENASE: LDH: 288 U/L — ABNORMAL HIGH (ref 98–192)

## 2021-06-14 MED ORDER — WARFARIN SODIUM 6 MG PO TABS
ORAL_TABLET | ORAL | 11 refills | Status: DC
Start: 2021-06-14 — End: 2021-06-16

## 2021-06-14 MED ORDER — PANTOPRAZOLE SODIUM 40 MG PO TBEC: 40.0000 mg | DELAYED_RELEASE_TABLET | Freq: Every day | ORAL | 3 refills | Status: DC

## 2021-06-14 NOTE — Patient Instructions (Addendum)
No medication changes today  2. Coumadin dosing per Ander Purpura PharmD 3. Return to [place] [facility] in 3 months with 7 year Intermacs with annual maintenance. Please bring back up bag, battery charger, batteries, and power module for maintenance.

## 2021-06-14 NOTE — Progress Notes (Signed)
Patient ID: Scott [name], male   DOB: 02-28-1946, 75 y.o.   MRN: [id number]     Primary Cardiologist: Dr Scott [name] INR : [facility]  HPI:  Scott [name] is a 75 y/o male with h/o CAD with ankylosing spondylitis, DM2, carotid stenting, severe ischemic CM s/p anterior STEM with VT arrest in 12/15, large PE with pulmonary infarct in 2/15 s/p IVC filter and systolic HF with EF 40%.  He underwent HM II LVAD placement on 09/20/13. He had a prolonged hospital course due to recurrent chest bleeding, severe CO2 retention and RV failure.  In 8/18 admitted with acute gallstone pancreatitis. Underwent lap chole 02/21/17.   Admitted 2/18 with elevated LDH. He was started on bivalirudin. LDH went down [phone].   He presents today for routine visit. Here with his wife. Doing well. Main issue is pain from ankylosing spondylitis. Having occasional hematuria from kidney stones. Has f/u with Urology. Denies orthopnea or PND. No fevers, chills or problems with driveline. No melena or neuro symptoms. No VAD alarms. Taking all meds as prescribed.      VAD Indication: Destination Therapy patient choice     VAD interrogation & Equipment Management: Speed: 9200 Flow: 5.5 Power:  5.8w    PI: 5.6   Alarms: multiple LV advisories Events: rare   Fixed speed 9200 Low speed limit: 8600   Primary controller: Replace back-up battery in 20 months Back up controller: Replace back-up battery in 21 months    Annual Equipment Maintenance on UBC/PM was performed on 08/30/2020.   I reviewed the LVAD parameters from today and compared the results to the patient's prior recorded data. LVAD interrogation was NEGATIVE for significant power changes, NEGATIVE for clinical alarms and STABLE for PI events/speed drops. No programming changes were made and pump is functioning within specified parameters. Pt is performing daily controller and system monitor self tests along with completing weekly and monthly maintenance  for LVAD equipment.   LVAD equipment check completed and is in good working order. Back-up equipment present  SH:  Social History   Socioeconomic History   Marital status: Married    Spouse name: Not on file   Number of children: Not on file   Years of education: Not on file   Highest education level: Not on file  Occupational History   Occupation: retired  Tobacco Use   Smoking status: Former    Packs/day: 0.30    Years: 0.00    Pack years: 0.00    Types: Cigarettes    Quit date: 06/12/1969    Years since quitting: 52.0   Smokeless tobacco: Never   Tobacco comments:    pt smoked while in [employer] 2-3 cig x 6 months.  Vaping Use   Vaping Use: Former  Substance and Sexual Activity   Alcohol use: No    Alcohol/week: 0.0 standard drinks   Drug use: No   Sexual activity: Not Currently  Other Topics Concern   Not on file  Social History Narrative   Not on file   Social Determinants of Health   Financial Resource Strain: Not on file  Food Insecurity: Not on file  Transportation Needs: Not on file  Physical Activity: Not on file  Stress: Not on file  Social Connections: Not on file  Intimate Partner Violence: Not on file    FH:  Family History  Problem Relation Age of Onset   Hypertension Father    Heart attack Father    Heart Problems Sister  Past Medical History:  Diagnosis Date   Acute on chronic respiratory failure ([place])    a. 08/2014 in setting of PE.   Ankylosing spondylitis ([place])    Bilateral pulmonary embolism ([place])    a. 08/2014 - started on Coumadin. Retrievable IVC filter placed 08/27/14 due to RV strain and large clot burden.   CAD (coronary artery disease)    a. stenting of LCx 2013; b. STEMI 06/12/14 s/p PCI to LAD complicated by post cath shock requiring IABP; VT s/p DCCV, EF 20%; c. NSTEMI 06/26/14 treated medically.   Carotid artery disease ([place])    a. s/p stenting.   Chronic systolic CHF (congestive heart failure) ([place])    Diabetes ([place])     Hemoptysis    a. 08/2014 possibly due to pulm infarct/PE.   Hyperlipidemia    Hypertension    Hypotension    Ischemic cardiomyopathy    a. echo 08/23/2014 EF <20%, dilated CM, mod MR/TR   Leukocytosis    Leukocytosis 09/10/2014   Nephrolithiasis    Pleural effusion on right 08/2014 - small   Reactive thrombocytosis 09/10/2014   Right leg DVT ([place])    a. 08/2014.   Ventricular tachycardia    a. 06/2014 at time of MI, s/p DCCV.   Current Outpatient Medications  Medication Sig Dispense Refill   acetaminophen (TYLENOL) 500 MG tablet Take 1,000 mg by mouth every 6 (six) hours as needed for moderate pain.      aspirin EC 81 MG tablet Take 1 tablet (81 mg total) by mouth daily. 90 tablet 3   cephALEXin (KEFLEX) 500 MG capsule Take 1 capsule (500 mg total) by mouth 2 (two) times daily. 90 capsule 6   clobetasol ointment (TEMOVATE) 8.34 % Apply 1 application topically 2 (two) times daily. 30 g 2   clopidogrel (PLAVIX) 75 MG tablet Take 1 tablet (75 mg total) by mouth daily. 90 tablet 3   cyclobenzaprine (FLEXERIL) 5 MG tablet Take 1 tablet (5 mg total) by mouth 3 (three) times daily as needed for muscle spasms. 90 tablet 5   furosemide (LASIX) 20 MG tablet Take 1 tablet (20 mg total) by mouth daily. Take 20 mg daily; may take 40 mg if weight gain 120 tablet 3   potassium chloride SA (KLOR-CON) 20 MEQ tablet Every other day alternate 20 mEq (1 tablet) with 40 mEq (2 tablets) (Patient taking differently: Every other day alternate 20 mEq (1 tablet) with 40 mEq (2 tablets)) 135 tablet 3   rosuvastatin (CRESTOR) 10 MG tablet Take 10 mg by mouth 3 (three) times a week.     sildenafil (VIAGRA) 100 MG tablet Take 1 tablet (100 mg total) by mouth daily as needed for erectile dysfunction. 10 tablet 3   traMADol (ULTRAM) 50 MG tablet Take 2 tablets (100 mg total) by mouth every 6 (six) hours as needed for moderate pain. 120 tablet 3   traZODone (DESYREL) 100 MG tablet TAKE 2 TABLETS (200 MG TOTAL) BY MOUTH  AT BEDTIME. 90 tablet 3   pantoprazole (PROTONIX) 40 MG tablet Take 1 tablet (40 mg total) by mouth at bedtime. 90 tablet 3   warfarin (JANTOVEN) 6 MG tablet TAKE 1 TABLET DAILY EXCEPT TAKE 1 AND 1/2 TABLETS (9  MG) ON WEDNESDAY 100 tablet 11   No current facility-administered medications for this encounter.       Vital Signs:  Temp: 97.9 Doppler Pressure: 110 Automatc BP: 101/82 (92) HR: 89 SPO2: 98% RA   Weight: 196.6 lb w/o  eqt Last weight: 195 lbs   Wt Readings from Last 3 Encounters:  06/14/21 89.2 kg (196 lb 9.6 oz)  03/15/21 88.5 kg (195 lb)  12/06/20 89 kg (196 lb 3.2 oz)   Physical Exam: General:  NAD.  HEENT: normal  Neck: supple. JVP not elevated.  Carotids 2+ bilat; no bruits. No lymphadenopathy or thryomegaly appreciated. Cor: LVAD hum.  Lungs: Clear. Abdomen: soft, nontender, non-distended. No hepatosplenomegaly. No bruits or masses. Good bowel sounds. Driveline site clean. Anchor in place.  Extremities: no cyanosis, clubbing, rash. Warm no edema  Neuro: alert & oriented x 3. No focal deficits. Moves all 4 without problem    ASSESSMENT & PLAN:  1. Chronic systolic HF: Ischemic cardiomyopathy, EF 20% s/p HMII LVAD 09/20/13.   - Doing well. NYHA II I. Now  over 7.5 years out - Volume status and MAPs look stable. Continue current regimen  2. CAD s/p Anterior STEMI on 06/12/14 with stenting of LAD:  - No s/s ischemia - Off ASA.  - Previously stopped statin but now back on crestor 3x/week  3. DL infection, chronic - Site looks good today. Continue Keflex  4 VAD management:  - Had admit in 08/2017 for elevated LDH.  - LDH stabilized 300-380 range. Concern has been for chronic pump clot.  - LDH 288 today - Hgb 13.4 -> 11.8 today. No s/s bleeding. MCV 83. Check iron stores - Off asa. On coumadin.  - INR 3.0  Will try to keep INR 2.3- 2.8. Discussed dosing with PharmD personally. - Continue Keflex as above for DL - VAD interrogated personally. Parameters  stable.   5. Ankylosing spondylitis: Off NSAIDS. -  No longer following with rheumatology. Uses tramadol. Avoids NSAIDs as much as possible. -  No change  6. HTN:  -MAPs ok   7. Kidney Stones  - CT with renal calculi bilaterally. Had some recent hematuria  Now followed by Dr Tresa Moore at [facility]. - Has f/u soon   8. Depression - situational. Seems better lately. Mostly just grumpy.  - has refused SSRI - no change.   Total time spent 35 minutes. Over half that time spent discussing above.    Scott Bickers, MD  06/14/2021

## 2021-06-14 NOTE — Progress Notes (Signed)
Patient presents for 3 month follow up in [facility] today with wife. Reports no problems with VAD equipment or drive line.   Patient reports he feels "pretty good" overall and denies any specific complaints, other than feeling tired from lack of sleep.    Reports occasional blood in his urine due to 4 large kidney stones that "are too big to pass on their own." He has regular follow up with his urologist Dr Tresa Moore.   Hgb 11.8 today (down from 13.4 three months ago.) Denies blood in stool. MCV 83.3 (down from 87.8). Per Dr Haroldine Laws will check anemia panel with next lab draw.   Wife reports she is changing drive line dressing every 5 days using daily kit. Reports occasional scab formation with small amount of bloody drainage. He is taking Keflex 500 mg TID. Provided with 14 daily kits, and 15 anchors today for home use.   Wife reports he has restarted Crestor three times per week based off conversation he had last visit with Dr Haroldine Laws.  Vital Signs:  Temp: 97.9 Doppler Pressure: 110 Automatc BP: 101/82 (92) HR: 89 SPO2: 98% RA   Weight: 196.6 lb w/o eqt Last weight: 195 lbs  VAD Indication: Destination Therapy patient choice     VAD interrogation & Equipment Management: Speed: 9200 Flow: 5.5 Power:  5.8w    PI: 5.6   Alarms: multiple LV advisories Events: rare   Fixed speed 9200 Low speed limit: 8600   Primary controller: Replace back-up battery in 20 months Back up controller: Replace back-up battery in 21 months   Annual Equipment Maintenance on UBC/PM was performed on 08/30/2020.   I reviewed the LVAD parameters from today and compared the results to the patient's prior recorded data. LVAD interrogation was NEGATIVE for significant power changes, NEGATIVE for clinical alarms and STABLE for PI events/speed drops. No programming changes were made and pump is functioning within specified parameters. Pt is performing daily controller and system monitor self tests along with  completing weekly and monthly maintenance for LVAD equipment.   LVAD equipment check completed and is in good working order. Back-up equipment present.    Exit Site Care: Drive line is being maintained weekly or as needed by his wife using daily dressing kits. Dressing dry and intact. Stabilization device present and accurately applied. Pt denies fever or chills. 15 anchors and 14 daily dressing kits provided for home use.   Significant Events on VAD Support:  02/21/17> lap chole   Device: none   BP & Labs:  MAP 110 - Doppler is reflecting modified systolic   Hgb 16.8 - No S/S of bleeding. Specifically denies melena/BRBPR or nosebleeds.   LDH 288 and is within established baseline of 250- 360. Pt denies tea-colored urine. No power elevations noted on interrogation. We will continue to check this with his INR.   Patient Instructions:  No medication changes today  2. Coumadin dosing per Ander Purpura PharmD 3. Return to [place] [facility] in 3 months with 7 year Intermacs with annual maintenance. Please bring back up bag, battery charger, batteries, and power module for maintenance.  4. Per Dr Haroldine Laws will obtain anemia panel with next INR/LDH draw   Emerson Monte RN [place] Coordinator  Office: [phone]  24/7 Pager: [phone]

## 2021-06-14 NOTE — Progress Notes (Signed)
LVAD INR 

## 2021-06-15 ENCOUNTER — Other Ambulatory Visit (HOSPITAL_COMMUNITY): Payer: Self-pay | Admitting: Internal Medicine

## 2021-06-15 ENCOUNTER — Other Ambulatory Visit (HOSPITAL_COMMUNITY): Payer: Self-pay

## 2021-06-15 DIAGNOSIS — Z7901 Long term (current) use of anticoagulants: Secondary | ICD-10-CM

## 2021-06-15 DIAGNOSIS — Z95811 Presence of heart assist device: Secondary | ICD-10-CM

## 2021-06-15 DIAGNOSIS — R0609 Other forms of dyspnea: Secondary | ICD-10-CM

## 2021-06-15 DIAGNOSIS — G479 Sleep disorder, unspecified: Secondary | ICD-10-CM

## 2021-06-15 DIAGNOSIS — I2510 Atherosclerotic heart disease of native coronary artery without angina pectoris: Secondary | ICD-10-CM

## 2021-06-15 DIAGNOSIS — I5023 Acute on chronic systolic (congestive) heart failure: Secondary | ICD-10-CM

## 2021-06-16 ENCOUNTER — Other Ambulatory Visit (HOSPITAL_COMMUNITY): Payer: Self-pay

## 2021-06-21 ENCOUNTER — Encounter (HOSPITAL_COMMUNITY): Payer: Medicare Other

## 2021-06-29 ENCOUNTER — Other Ambulatory Visit (HOSPITAL_COMMUNITY): Payer: Self-pay | Admitting: *Deleted

## 2021-06-29 DIAGNOSIS — D508 Other iron deficiency anemias: Secondary | ICD-10-CM

## 2021-06-29 DIAGNOSIS — I5023 Acute on chronic systolic (congestive) heart failure: Secondary | ICD-10-CM

## 2021-06-29 DIAGNOSIS — Z7901 Long term (current) use of anticoagulants: Secondary | ICD-10-CM

## 2021-06-29 DIAGNOSIS — Z95811 Presence of heart assist device: Secondary | ICD-10-CM

## 2021-06-29 DIAGNOSIS — E611 Iron deficiency: Secondary | ICD-10-CM

## 2021-07-05 ENCOUNTER — Other Ambulatory Visit: Payer: Self-pay

## 2021-07-05 ENCOUNTER — Other Ambulatory Visit (HOSPITAL_COMMUNITY): Payer: Self-pay | Admitting: *Deleted

## 2021-07-05 ENCOUNTER — Ambulatory Visit (HOSPITAL_COMMUNITY): Payer: Self-pay | Admitting: Pharmacist

## 2021-07-05 ENCOUNTER — Ambulatory Visit (HOSPITAL_COMMUNITY)
Admission: RE | Admit: 2021-07-05 | Discharge: 2021-07-05 | Disposition: A | Discharge status: Home / Self Care | Payer: Medicare Other | Source: Ambulatory Visit | Attending: Cardiology | Admitting: Cardiology

## 2021-07-05 DIAGNOSIS — Z5181 Encounter for therapeutic drug level monitoring: Secondary | ICD-10-CM | POA: Insufficient documentation

## 2021-07-05 DIAGNOSIS — I5023 Acute on chronic systolic (congestive) heart failure: Secondary | ICD-10-CM | POA: Diagnosis not present

## 2021-07-05 DIAGNOSIS — Z95811 Presence of heart assist device: Secondary | ICD-10-CM

## 2021-07-05 DIAGNOSIS — R6889 Other general symptoms and signs: Secondary | ICD-10-CM

## 2021-07-05 DIAGNOSIS — Z7901 Long term (current) use of anticoagulants: Secondary | ICD-10-CM

## 2021-07-05 DIAGNOSIS — E611 Iron deficiency: Secondary | ICD-10-CM

## 2021-07-05 DIAGNOSIS — D508 Other iron deficiency anemias: Secondary | ICD-10-CM | POA: Insufficient documentation

## 2021-07-05 LAB — VITAMIN B12: Vitamin B-12: 346 pg/mL (ref 180–914)

## 2021-07-05 LAB — FOLATE: Folate: 11.4 ng/mL (ref >5.9)

## 2021-07-05 LAB — PROTIME-INR
INR: 2.4 — ABNORMAL HIGH (ref 0.8–1.2)
Prothrombin Time: 26.5 seconds — ABNORMAL HIGH (ref 11.4–15.2)

## 2021-07-05 LAB — IRON AND TIBC
Iron: 27 ug/dL — ABNORMAL LOW (ref 45–182)
Saturation Ratios: 5 % — ABNORMAL LOW (ref 17.9–39.5)
TIBC: 533 ug/dL — ABNORMAL HIGH (ref 250–450)
UIBC: 506 ug/dL

## 2021-07-05 LAB — LACTATE DEHYDROGENASE: LDH: 272 U/L — ABNORMAL HIGH (ref 98–192)

## 2021-07-05 LAB — FERRITIN: Ferritin: 14 ng/mL — ABNORMAL LOW (ref 24–336)

## 2021-07-05 NOTE — Progress Notes (Signed)
LVAD INR 

## 2021-07-06 ENCOUNTER — Other Ambulatory Visit (HOSPITAL_COMMUNITY): Payer: Self-pay | Admitting: Unknown Physician Specialty

## 2021-07-06 DIAGNOSIS — E611 Iron deficiency: Secondary | ICD-10-CM

## 2021-07-11 ENCOUNTER — Inpatient Hospital Stay (HOSPITAL_COMMUNITY): Admission: RE | Admit: 2021-07-11 | Payer: Medicare Other | Source: Ambulatory Visit

## 2021-07-11 ENCOUNTER — Encounter (HOSPITAL_COMMUNITY): Payer: Self-pay

## 2021-07-18 ENCOUNTER — Encounter (HOSPITAL_COMMUNITY): Payer: Medicare Other

## 2021-07-19 ENCOUNTER — Other Ambulatory Visit (HOSPITAL_COMMUNITY): Payer: Self-pay | Admitting: *Deleted

## 2021-07-19 DIAGNOSIS — Z95811 Presence of heart assist device: Secondary | ICD-10-CM

## 2021-07-19 DIAGNOSIS — I5023 Acute on chronic systolic (congestive) heart failure: Secondary | ICD-10-CM

## 2021-07-19 DIAGNOSIS — Z7901 Long term (current) use of anticoagulants: Secondary | ICD-10-CM

## 2021-07-20 ENCOUNTER — Encounter (HOSPITAL_COMMUNITY)
Admission: RE | Admit: 2021-07-20 | Discharge: 2021-07-20 | Disposition: A | Discharge status: Home / Self Care | Payer: Medicare Other | Source: Ambulatory Visit | Attending: Internal Medicine | Admitting: Internal Medicine

## 2021-07-20 ENCOUNTER — Other Ambulatory Visit: Payer: Self-pay

## 2021-07-20 DIAGNOSIS — E611 Iron deficiency: Secondary | ICD-10-CM | POA: Insufficient documentation

## 2021-07-20 MED ORDER — SODIUM CHLORIDE 0.9 % IV SOLN
510.0000 mg | INTRAVENOUS | Status: DC
  Administered 2021-07-20: 510 mg via INTRAVENOUS
  Filled 2021-07-20: qty 510

## 2021-07-26 ENCOUNTER — Other Ambulatory Visit: Payer: Self-pay

## 2021-07-26 ENCOUNTER — Ambulatory Visit (HOSPITAL_COMMUNITY): Payer: Self-pay | Admitting: Pharmacist

## 2021-07-26 ENCOUNTER — Ambulatory Visit (HOSPITAL_COMMUNITY)
Admission: RE | Admit: 2021-07-26 | Discharge: 2021-07-26 | Disposition: A | Discharge status: Home / Self Care | Payer: Medicare Other | Source: Ambulatory Visit | Attending: Cardiology | Admitting: Cardiology

## 2021-07-26 DIAGNOSIS — Z95811 Presence of heart assist device: Secondary | ICD-10-CM | POA: Insufficient documentation

## 2021-07-26 DIAGNOSIS — Z7901 Long term (current) use of anticoagulants: Secondary | ICD-10-CM | POA: Insufficient documentation

## 2021-07-26 DIAGNOSIS — I5023 Acute on chronic systolic (congestive) heart failure: Secondary | ICD-10-CM | POA: Insufficient documentation

## 2021-07-26 LAB — PROTIME-INR
INR: 2.2 — ABNORMAL HIGH (ref 0.8–1.2)
Prothrombin Time: 24 seconds — ABNORMAL HIGH (ref 11.4–15.2)

## 2021-07-26 LAB — LACTATE DEHYDROGENASE: LDH: 315 U/L — ABNORMAL HIGH (ref 98–192)

## 2021-07-26 NOTE — Progress Notes (Signed)
LVAD INR 

## 2021-07-27 ENCOUNTER — Encounter (HOSPITAL_COMMUNITY)
Admission: RE | Admit: 2021-07-27 | Discharge: 2021-07-27 | Disposition: A | Discharge status: Home / Self Care | Payer: Medicare Other | Source: Ambulatory Visit | Attending: Internal Medicine | Admitting: Internal Medicine

## 2021-07-27 DIAGNOSIS — E611 Iron deficiency: Secondary | ICD-10-CM | POA: Diagnosis not present

## 2021-07-27 MED ORDER — SODIUM CHLORIDE 0.9 % IV SOLN
510.0000 mg | INTRAVENOUS | Status: DC
  Administered 2021-07-27: 510 mg via INTRAVENOUS
  Filled 2021-07-27: qty 510

## 2021-08-11 ENCOUNTER — Other Ambulatory Visit (HOSPITAL_COMMUNITY): Payer: Self-pay | Admitting: Unknown Physician Specialty

## 2021-08-11 DIAGNOSIS — Z7901 Long term (current) use of anticoagulants: Secondary | ICD-10-CM

## 2021-08-11 DIAGNOSIS — Z95811 Presence of heart assist device: Secondary | ICD-10-CM

## 2021-08-16 ENCOUNTER — Other Ambulatory Visit: Payer: Self-pay

## 2021-08-16 ENCOUNTER — Ambulatory Visit (HOSPITAL_COMMUNITY)
Admission: RE | Admit: 2021-08-16 | Discharge: 2021-08-16 | Disposition: A | Discharge status: Home / Self Care | Payer: Medicare Other | Source: Ambulatory Visit | Attending: Internal Medicine | Admitting: Internal Medicine

## 2021-08-16 ENCOUNTER — Ambulatory Visit (HOSPITAL_COMMUNITY): Payer: Self-pay | Admitting: Pharmacist

## 2021-08-16 DIAGNOSIS — Z7901 Long term (current) use of anticoagulants: Secondary | ICD-10-CM | POA: Insufficient documentation

## 2021-08-16 DIAGNOSIS — Z95811 Presence of heart assist device: Secondary | ICD-10-CM | POA: Diagnosis not present

## 2021-08-16 LAB — PROTIME-INR
INR: 2.3 — ABNORMAL HIGH (ref 0.8–1.2)
Prothrombin Time: 25.2 seconds — ABNORMAL HIGH (ref 11.4–15.2)

## 2021-08-16 LAB — LACTATE DEHYDROGENASE: LDH: 391 U/L — ABNORMAL HIGH (ref 98–192)

## 2021-08-16 NOTE — Progress Notes (Signed)
LVAD INR 

## 2021-09-12 ENCOUNTER — Other Ambulatory Visit (HOSPITAL_COMMUNITY): Payer: Self-pay | Admitting: Unknown Physician Specialty

## 2021-09-12 DIAGNOSIS — Z95811 Presence of heart assist device: Secondary | ICD-10-CM

## 2021-09-12 DIAGNOSIS — Z7901 Long term (current) use of anticoagulants: Secondary | ICD-10-CM

## 2021-09-13 ENCOUNTER — Ambulatory Visit (HOSPITAL_COMMUNITY)
Admission: RE | Admit: 2021-09-13 | Discharge: 2021-09-13 | Disposition: A | Discharge status: Home / Self Care | Payer: Medicare Other | Source: Ambulatory Visit | Attending: Internal Medicine | Admitting: Internal Medicine

## 2021-09-13 ENCOUNTER — Ambulatory Visit (HOSPITAL_COMMUNITY): Payer: Self-pay | Admitting: Pharmacist

## 2021-09-13 ENCOUNTER — Other Ambulatory Visit: Payer: Self-pay

## 2021-09-13 VITALS — BP 98/0 | HR 94 | Ht 68.0 in | Wt 197.4 lb

## 2021-09-13 DIAGNOSIS — Z95811 Presence of heart assist device: Secondary | ICD-10-CM | POA: Diagnosis not present

## 2021-09-13 DIAGNOSIS — Z955 Presence of coronary angioplasty implant and graft: Secondary | ICD-10-CM | POA: Insufficient documentation

## 2021-09-13 DIAGNOSIS — E119 Type 2 diabetes mellitus without complications: Secondary | ICD-10-CM | POA: Diagnosis not present

## 2021-09-13 DIAGNOSIS — I110 Hypertensive heart disease with heart failure: Secondary | ICD-10-CM | POA: Diagnosis not present

## 2021-09-13 DIAGNOSIS — F4321 Adjustment disorder with depressed mood: Secondary | ICD-10-CM | POA: Insufficient documentation

## 2021-09-13 DIAGNOSIS — Z8674 Personal history of sudden cardiac arrest: Secondary | ICD-10-CM | POA: Diagnosis not present

## 2021-09-13 DIAGNOSIS — D649 Anemia, unspecified: Secondary | ICD-10-CM

## 2021-09-13 DIAGNOSIS — M459 Ankylosing spondylitis of unspecified sites in spine: Secondary | ICD-10-CM | POA: Insufficient documentation

## 2021-09-13 DIAGNOSIS — Z86711 Personal history of pulmonary embolism: Secondary | ICD-10-CM | POA: Diagnosis not present

## 2021-09-13 DIAGNOSIS — N200 Calculus of kidney: Secondary | ICD-10-CM | POA: Insufficient documentation

## 2021-09-13 DIAGNOSIS — Z7901 Long term (current) use of anticoagulants: Secondary | ICD-10-CM | POA: Diagnosis not present

## 2021-09-13 DIAGNOSIS — I2510 Atherosclerotic heart disease of native coronary artery without angina pectoris: Secondary | ICD-10-CM | POA: Insufficient documentation

## 2021-09-13 DIAGNOSIS — Z79899 Other long term (current) drug therapy: Secondary | ICD-10-CM | POA: Insufficient documentation

## 2021-09-13 DIAGNOSIS — G479 Sleep disorder, unspecified: Secondary | ICD-10-CM | POA: Diagnosis not present

## 2021-09-13 DIAGNOSIS — M1990 Unspecified osteoarthritis, unspecified site: Secondary | ICD-10-CM | POA: Insufficient documentation

## 2021-09-13 DIAGNOSIS — I5022 Chronic systolic (congestive) heart failure: Secondary | ICD-10-CM | POA: Diagnosis not present

## 2021-09-13 DIAGNOSIS — I252 Old myocardial infarction: Secondary | ICD-10-CM | POA: Insufficient documentation

## 2021-09-13 DIAGNOSIS — I255 Ischemic cardiomyopathy: Secondary | ICD-10-CM | POA: Insufficient documentation

## 2021-09-13 LAB — COMPREHENSIVE METABOLIC PANEL
ALT: 28 U/L (ref 0–44)
AST: 31 U/L (ref 15–41)
Albumin: 4.3 g/dL (ref 3.5–5.0)
Alkaline Phosphatase: 135 U/L — ABNORMAL HIGH (ref 38–126)
Anion gap: 10 (ref 5–15)
BUN: 17 mg/dL (ref 8–23)
CO2: 27 mmol/L (ref 22–32)
Calcium: 9.8 mg/dL (ref 8.9–10.3)
Chloride: 100 mmol/L (ref 98–111)
Creatinine, Ser: 1.11 mg/dL (ref 0.61–1.24)
GFR, Estimated: >60 mL/min (ref >60)
Glucose, Bld: 106 mg/dL — ABNORMAL HIGH (ref 70–99)
Potassium: 4.7 mmol/L (ref 3.5–5.1)
Sodium: 137 mmol/L (ref 135–145)
Total Bilirubin: 0.9 mg/dL (ref 0.3–1.2)
Total Protein: 8.8 g/dL — ABNORMAL HIGH (ref 6.5–8.1)

## 2021-09-13 LAB — IRON AND TIBC
Iron: 57 ug/dL (ref 45–182)
Saturation Ratios: 12 % — ABNORMAL LOW (ref 17.9–39.5)
TIBC: 469 ug/dL — ABNORMAL HIGH (ref 250–450)
UIBC: 412 ug/dL

## 2021-09-13 LAB — CBC
HCT: 43.3 % (ref 39.0–52.0)
Hemoglobin: 14.2 g/dL (ref 13.0–17.0)
MCH: 28.9 pg (ref 26.0–34.0)
MCHC: 32.8 g/dL (ref 30.0–36.0)
MCV: 88 fL (ref 80.0–100.0)
Platelets: 351 10*3/uL (ref 150–400)
RBC: 4.92 MIL/uL (ref 4.22–5.81)
RDW: 20.3 % — ABNORMAL HIGH (ref 11.5–15.5)
WBC: 8.8 10*3/uL (ref 4.0–10.5)
nRBC: 0 % (ref 0.0–0.2)

## 2021-09-13 LAB — PROTIME-INR
INR: 3 — ABNORMAL HIGH (ref 0.8–1.2)
Prothrombin Time: 31.4 seconds — ABNORMAL HIGH (ref 11.4–15.2)

## 2021-09-13 LAB — FOLATE: Folate: 11.5 ng/mL (ref >5.9)

## 2021-09-13 LAB — PREALBUMIN: Prealbumin: 17.4 mg/dL — ABNORMAL LOW (ref 18–38)

## 2021-09-13 LAB — FERRITIN: Ferritin: 38 ng/mL (ref 24–336)

## 2021-09-13 LAB — LACTATE DEHYDROGENASE: LDH: 249 U/L — ABNORMAL HIGH (ref 98–192)

## 2021-09-13 LAB — VITAMIN B12: Vitamin B-12: 339 pg/mL (ref 180–914)

## 2021-09-13 NOTE — Progress Notes (Signed)
Patient presents for 3 month follow up  with 7 year intermacs and annual maintenance in [facility] today with wife. Reports no problems with VAD equipment or drive line.  ? ?Patient reports he feels "pretty good" overall and denies any specific complaints, other than feeling tired from lack of sleep.   ? ?Pt states that he does have pain from his spondylitis. Pt has 4 kidney stones but they are not causing him any issues currently. ? ?Vital Signs:  ?Doppler Pressure: 98 ?Automatc BP: 107/83 (91) ?HR: 94 ?SPO2: 96% RA ?  ?Weight: 197.4 lb w/o eqt ?Last weight: 196.6 lbs ? ?VAD Indication: ?Destination Therapy patient choice   ?  ?VAD interrogation & Equipment Management: ?Speed: 9200 ?Flow: 5.2 ?Power:  5.6w    ?PI: 5.6 ?  ?Alarms: multiple LV advisories ?Events: rare  ? ?Fixed speed 9200 ?Low speed limit: 8600 ?  ?Primary controller: Replace back-up battery in 17 months ?Back up controller: Replace back-up battery in 15 months  ? ?Annual Equipment Maintenance on UBC/PM was performed on 08/30/2020. ?  ?I reviewed the LVAD parameters from today and compared the results to the patient's prior recorded data. LVAD interrogation was NEGATIVE for significant power changes, NEGATIVE for clinical alarms and STABLE for PI events/speed drops. No programming changes were made and pump is functioning within specified parameters. Pt is performing daily controller and system monitor self tests along with completing weekly and monthly maintenance for LVAD equipment. ?  ?LVAD equipment check completed and is in good working order. Back-up equipment present.  ?  ?Exit Site Care: ?Drive line is being maintained weekly or as needed by his wife using daily dressing kits. Dressing dry and intact. Stabilization device present and accurately applied. Pt denies fever or chills. 15 anchors and 21 daily dressing kits provided for home use.  ? ?Significant Events on VAD Support:  ?02/21/17> lap chole ?  ?Device: none ? ? ?BP & Labs:  ?MAP 98 -  Doppler is reflecting modified systolic ?  ?Hgb 14.2 - No S/S of bleeding. Specifically denies melena/BRBPR or nosebleeds. ?  ?LDH 288 and is within established baseline of 250- 360. Pt denies tea-colored urine. No power elevations noted on interrogation. We will continue to check this with his INR.  ? ? ?Batteries Manufacture Date: Number of uses: Re-calibration  ?04/22/20 104 Performed by patient  ? ?Annual maintenance completed per Biomed on patient?s home power module and Electrical engineer.   ? ?Backup system controller 11 volt battery charged during visit. ?  ?7 year Intermacs follow up completed including:  Quality of Life, KCCQ-12, and Neurocognitive trail making.  ? ?Pt completed 920 feet during 6 minute walk. ? ?[facility] Cardiomyopathy Questionnaire  ?KCCQ-12 09/13/2021 03/15/2021 08/30/2020  ?1 a. Ability to shower/bathe Moderately limited Quite a bit limited Slightly limited  ?1 b. Ability to walk 1 block Slightly limited Quite a bit limited Quite a bit limited  ?1 c. Ability to hurry/jog Extremely limited Extremely limited Extremely limited  ?2. Edema feet/ankles/legs Never over the past 2 weeks Never over the past 2 weeks Never over the past 2 weeks  ?3. Limited by fatigue Several times a day Several times a day All of the time  ?4. Limited by dyspnea All of the time Several times a day All of the time  ?5. Sitting up / on 3+ pillows Never over the past 2 weeks Never over the past 2 weeks Never over the past 2 weeks  ?6. Limited enjoyment  of life Limited quite a bit Moderately limited Moderately limited  ?7. Rest of life w/ symptoms Mostly dissatisfied Mostly dissatisfied Mostly dissatisfied  ?8 a. Participation in hobbies Limited quite a bit Limited quite a bit Limited quite a bit  ?8 b. Participation in chores Severely limited Limited quite a bit Limited quite a bit  ?8 c. Visiting family/friends Limited quite a bit Moderately limited Moderately limited  ?  ? ?Patient Goals: ?Pt states he wants  "to get up every single day."  ? ?Patient Instructions:  ?May take Lasix when you need it ?Pt will need 1 dose of Fereheme; orders placed. Number given  ?3. Coumadin dosing per Ander Purpura PharmD ?4. Return to [employer] clinic in 3 months  ? ? ? ?[name] RN ?VAD Coordinator  ?Office: [phone]  ?24/7 Pager: [phone]  ? ? ?

## 2021-09-13 NOTE — Progress Notes (Addendum)
Patient ID: Scott [name], male   DOB: 05/28/1946, 76 y.o.   MRN: [id number] ? ? ? ?LVAD CLINIC NOTE ? ?Primary Cardiologist: Dr Haroldine Laws ?INR : [place] ? ?HPI: ? ?Scott [name] is a 76 y/o male with h/o CAD with ankylosing spondylitis, DM2, carotid stenting, severe ischemic CM s/p anterior STEM with VT arrest in 12/15, large PE with pulmonary infarct in 2/15 s/p IVC filter and systolic HF with EF 62%. ? ?He underwent HM II LVAD placement on 09/20/13. He had a prolonged hospital course due to recurrent chest bleeding, severe CO2 retention and RV failure. ? ?In 8/18 admitted with acute gallstone pancreatitis. Underwent lap chole 02/21/17.  ? ?Admitted 2/18 with elevated LDH. He was started on bivalirudin. LDH went down [phone].  ? ?He presents today for routine visit. Here with his wife. Doing well. Continues to struggle with ankylosing spondylitis. Continues with DOE. No DOE, orthopnea or PND. Weight stable. No fevers, chills or problems with driveline. No bleeding, melena or neuro symptoms. No VAD alarms. Taking all meds as prescribed.  ? ?  ?VAD Indication: ?Destination Therapy patient choice   ?  ?VAD interrogation & Equipment Management: ?Speed: 9200 ?Flow: 5.2 ?Power:  5.6w    ?PI: 5.6 ?  ?Alarms: multiple LV advisories ?Events: rare  ? ?Fixed speed 9200 ?Low speed limit: 8600 ?  ?Primary controller: Replace back-up battery in 17 months ?Back up controller: Replace back-up battery in 15 months  ?  ?Annual Equipment Maintenance on UBC/PM was performed on 08/30/2020. ?  ?I reviewed the LVAD parameters from today and compared the results to the patient's prior recorded data. LVAD interrogation was NEGATIVE for significant power changes, NEGATIVE for clinical alarms and STABLE for PI events/speed drops. No programming changes were made and pump is functioning within specified parameters. Pt is performing daily controller and system monitor self tests along with completing weekly and monthly maintenance for  LVAD equipment. ? ?SH:  ?Social History  ? ?Socioeconomic History  ? Marital status: Married  ?  Spouse name: Not on file  ? Number of children: Not on file  ? Years of education: Not on file  ? Highest education level: Not on file  ?Occupational History  ? Occupation: retired  ?Tobacco Use  ? Smoking status: Former  ?  Packs/day: 0.30  ?  Years: 0.00  ?  Pack years: 0.00  ?  Types: Cigarettes  ?  Quit date: 06/12/1969  ?  Years since quitting: 52.2  ? Smokeless tobacco: Never  ? Tobacco comments:  ?  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  ?Vaping Use  ? Vaping Use: Former  ?Substance and Sexual Activity  ? Alcohol use: No  ?  Alcohol/week: 0.0 standard drinks  ? Drug use: No  ? Sexual activity: Not Currently  ?Other Topics Concern  ? Not on file  ?Social History Narrative  ? Not on file  ? ?Social Determinants of Health  ? ?Financial Resource Strain: Not on file  ?Food Insecurity: Not on file  ?Transportation Needs: Not on file  ?Physical Activity: Not on file  ?Stress: Not on file  ?Social Connections: Not on file  ?Intimate Partner Violence: Not on file  ? ? ?FH:  ?Family History  ?Problem Relation Age of Onset  ? Hypertension Father   ? Heart attack Father   ? Heart Problems Sister   ? ? ?Past Medical History:  ?Diagnosis Date  ? Acute on chronic respiratory failure ([place])   ?  a. 08/2014 in setting of PE.  ? Ankylosing spondylitis ([place])   ? Bilateral pulmonary embolism ([facility])   ? a. 08/2014 - started on Coumadin. Retrievable IVC filter placed 08/27/14 due to RV strain and large clot burden.  ? CAD (coronary artery disease)   ? a. stenting of LCx 2013; b. STEMI 06/12/14 s/p PCI to LAD complicated by post cath shock requiring IABP; VT s/p DCCV, EF 20%; c. NSTEMI 06/26/14 treated medically.  ? Carotid artery disease ([place])   ? a. s/p stenting.  ? Chronic systolic CHF (congestive heart failure) ([place])   ? Diabetes ([place])   ? Hemoptysis   ? a. 08/2014 possibly due to pulm infarct/PE.  ? Hyperlipidemia   ?  Hypertension   ? Hypotension   ? Ischemic cardiomyopathy   ? a. echo 08/23/2014 EF <20%, dilated CM, mod MR/TR  ? Leukocytosis   ? Leukocytosis 09/10/2014  ? Nephrolithiasis   ? Pleural effusion on right 08/2014 - small  ? Reactive thrombocytosis 09/10/2014  ? Right leg DVT ([place])   ? a. 08/2014.  ? Ventricular tachycardia   ? a. 06/2014 at time of MI, s/p DCCV.  ? ?Current Outpatient Medications  ?Medication Sig Dispense Refill  ? acetaminophen (TYLENOL) 500 MG tablet Take 1,000 mg by mouth every 6 (six) hours as needed for moderate pain.     ? aspirin EC 81 MG tablet Take 1 tablet (81 mg total) by mouth daily. 90 tablet 3  ? cephALEXin (KEFLEX) 500 MG capsule Take 1 capsule (500 mg total) by mouth 2 (two) times daily. 90 capsule 6  ? clobetasol ointment (TEMOVATE) 9.32 % Apply 1 application topically 2 (two) times daily. 30 g 2  ? clopidogrel (PLAVIX) 75 MG tablet Take 1 tablet (75 mg total) by mouth daily. 90 tablet 3  ? cyclobenzaprine (FLEXERIL) 5 MG tablet Take 1 tablet (5 mg total) by mouth 3 (three) times daily as needed for muscle spasms. 90 tablet 5  ? furosemide (LASIX) 20 MG tablet Take 1 tablet (20 mg total) by mouth daily. Take 20 mg daily; may take 40 mg if weight gain 120 tablet 3  ? pantoprazole (PROTONIX) 40 MG tablet Take 1 tablet (40 mg total) by mouth at bedtime. 90 tablet 3  ? potassium chloride SA (KLOR-CON) 20 MEQ tablet Every other day alternate 20 mEq (1 tablet) with 40 mEq (2 tablets) (Patient taking differently: 20 mEq daily. Every other day alternate 20 mEq (1 tablet) with 40 mEq (2 tablets)) 135 tablet 3  ? rosuvastatin (CRESTOR) 10 MG tablet Take 10 mg by mouth 3 (three) times a week.    ? sildenafil (VIAGRA) 100 MG tablet Take 1 tablet (100 mg total) by mouth daily as needed for erectile dysfunction. 10 tablet 3  ? traMADol (ULTRAM) 50 MG tablet Take 2 tablets (100 mg total) by mouth every 6 (six) hours as needed for moderate pain. 120 tablet 3  ? traZODone (DESYREL) 100 MG tablet TAKE 2  TABLETS (200 MG TOTAL) BY MOUTH AT BEDTIME. 90 tablet 3  ? warfarin (JANTOVEN) 6 MG tablet TAKE 1 TABLET DAILY ,      EXCEPT TAKE 1 AND 1/2      TABLETS ('9MG'$ ) ON WEDNESDAY. 100 tablet 11  ? ?No current facility-administered medications for this encounter.  ? ? ? ?Vital Signs:  ?Doppler Pressure: 98 ?Automatc BP: 107/83 (91) ?HR: 94 ?SPO2: 96% RA ?  ?Weight: 197.4 lb w/o eqt ?Last weight: 196.6 lbs ?  ?Wt Readings  from Last 3 Encounters:  ?09/13/21 89.5 kg (197 lb 6.4 oz)  ?07/27/21 85.7 kg (189 lb)  ?06/14/21 89.2 kg (196 lb 9.6 oz)  ? ?Physical Exam: ?General:  NAD.  ?HEENT: normal  ?Neck: supple. JVP not elevated.  Carotids 2+ bilat; no bruits. No lymphadenopathy or thryomegaly appreciated. ?Cor: LVAD hum.  ?Lungs: Clear. ?Abdomen: soft, nontender, non-distended. No hepatosplenomegaly. No bruits or masses. Good bowel sounds. Driveline site clean. Anchor in place.  ?Extremities: no cyanosis, clubbing, rash. Warm no edema  ?Neuro: alert & oriented x 3. No focal deficits. Moves all 4 without problem  ? ?ASSESSMENT & PLAN: ? ?1. Chronic systolic HF: Ischemic cardiomyopathy, EF 20% s/p HMII LVAD 09/20/13.   ?- Stable NYHA II. Limited mostly by arthritis. Now 8 years out ?- Volume status and MAPs stable. Continue current regimen.  ? ?2. CAD s/p Anterior STEMI on 06/12/14 with stenting of LAD:  ?- No s/s ischemia ?- Off ASA.  ?- Previously stopped statin but now back on crestor 3x/week. No change ? ?3. DL infection, chronic ?- Site looks good today Continue Keflex ? ?4 VAD management:  ?- Had admit in 08/2017 for elevated LDH.  ?- LDH stabilized 300-380 range. Concern has been for chronic pump clot.  ?- LDH 249 today ?- Hgb 14.2 ?- Off asa. On coumadin.  ?- INR 3.0  Will try to keep INR 2.3- 2.8. Discussed dosing with PharmD personally. ?- Continue Keflex as above for DL ?- VAD interrogated personally. Parameters stable. ?  ?5. Ankylosing spondylitis: Off NSAIDS. ?-  No longer following with rheumatology. Uses tramadol.  Avoids NSAIDs as much as possible. ?-  No change ? ?6. HTN:  ?- MAPs ok ? ?7. Kidney Stones  ?- CT with renal calculi bilaterally. Had some recent hematuria  Now followed by Dr Tresa Moore at [facility]. ?- Has f/u so

## 2021-09-13 NOTE — Progress Notes (Signed)
LVAD INR 

## 2021-09-13 NOTE — Patient Instructions (Signed)
May take Lasix when you need it ?2. Coumadin dosing per Ander Purpura PharmD ?3. Return to [place] [facility] in 3 months  ?

## 2021-09-14 ENCOUNTER — Other Ambulatory Visit (HOSPITAL_COMMUNITY): Payer: Self-pay | Admitting: Unknown Physician Specialty

## 2021-09-14 MED ORDER — TRAZODONE HCL 100 MG PO TABS: 200.0000 mg | ORAL_TABLET | Freq: Every day | ORAL | 3 refills | Status: DC

## 2021-09-14 MED ORDER — CLOPIDOGREL BISULFATE 75 MG PO TABS: 75.0000 mg | ORAL_TABLET | Freq: Every day | ORAL | 3 refills | Status: DC

## 2021-09-14 NOTE — Addendum Note (Signed)
Encounter addended by: Christinia Gully, RN on: 09/14/2021 3:29 PM ? Actions taken: Pharmacy for encounter modified, Order list changed, Diagnosis association updated

## 2021-09-27 ENCOUNTER — Other Ambulatory Visit: Payer: Self-pay

## 2021-09-27 ENCOUNTER — Ambulatory Visit (HOSPITAL_COMMUNITY)
Admission: RE | Admit: 2021-09-27 | Discharge: 2021-09-27 | Disposition: A | Discharge status: Home / Self Care | Payer: Medicare Other | Source: Ambulatory Visit | Attending: Internal Medicine | Admitting: Internal Medicine

## 2021-09-27 DIAGNOSIS — D649 Anemia, unspecified: Secondary | ICD-10-CM | POA: Insufficient documentation

## 2021-09-27 MED ORDER — SODIUM CHLORIDE 0.9 % IV SOLN
510.0000 mg | Freq: Once | INTRAVENOUS | Status: AC
  Administered 2021-09-27: 510 mg via INTRAVENOUS
  Filled 2021-09-27: qty 510

## 2021-09-29 ENCOUNTER — Other Ambulatory Visit (HOSPITAL_COMMUNITY): Payer: Self-pay | Admitting: Unknown Physician Specialty

## 2021-09-29 DIAGNOSIS — Z95811 Presence of heart assist device: Secondary | ICD-10-CM

## 2021-09-29 DIAGNOSIS — Z7901 Long term (current) use of anticoagulants: Secondary | ICD-10-CM

## 2021-10-04 ENCOUNTER — Ambulatory Visit (HOSPITAL_COMMUNITY)
Admission: RE | Admit: 2021-10-04 | Discharge: 2021-10-04 | Disposition: A | Discharge status: Home / Self Care | Payer: Medicare Other | Source: Ambulatory Visit | Attending: Cardiology | Admitting: Cardiology

## 2021-10-04 ENCOUNTER — Ambulatory Visit (HOSPITAL_COMMUNITY): Payer: Self-pay | Admitting: Pharmacist

## 2021-10-04 DIAGNOSIS — Z95811 Presence of heart assist device: Secondary | ICD-10-CM | POA: Diagnosis not present

## 2021-10-04 DIAGNOSIS — Z7901 Long term (current) use of anticoagulants: Secondary | ICD-10-CM | POA: Insufficient documentation

## 2021-10-04 LAB — PROTIME-INR
INR: 3.3 — ABNORMAL HIGH (ref 0.8–1.2)
Prothrombin Time: 32.9 seconds — ABNORMAL HIGH (ref 11.4–15.2)

## 2021-10-04 LAB — LACTATE DEHYDROGENASE: LDH: 443 U/L — ABNORMAL HIGH (ref 98–192)

## 2021-10-04 NOTE — Progress Notes (Signed)
LVAD INR 

## 2021-10-16 ENCOUNTER — Ambulatory Visit (HOSPITAL_COMMUNITY)
Admission: RE | Admit: 2021-10-16 | Discharge: 2021-10-16 | Disposition: A | Discharge status: Home / Self Care | Payer: Medicare Other | Source: Ambulatory Visit | Attending: Cardiology | Admitting: Cardiology

## 2021-10-16 ENCOUNTER — Encounter (HOSPITAL_COMMUNITY): Payer: Self-pay

## 2021-10-16 ENCOUNTER — Ambulatory Visit (HOSPITAL_COMMUNITY)
Admission: RE | Admit: 2021-10-16 | Discharge: 2021-10-16 | Disposition: A | Discharge status: Home / Self Care | Payer: Medicare Other | Source: Ambulatory Visit | Attending: Internal Medicine | Admitting: Internal Medicine

## 2021-10-16 ENCOUNTER — Other Ambulatory Visit (HOSPITAL_COMMUNITY): Payer: Self-pay | Admitting: *Deleted

## 2021-10-16 ENCOUNTER — Ambulatory Visit (HOSPITAL_COMMUNITY): Payer: Self-pay | Admitting: Pharmacist

## 2021-10-16 VITALS — BP 94/53 | HR 75 | Temp 97.4°F

## 2021-10-16 DIAGNOSIS — R042 Hemoptysis: Secondary | ICD-10-CM

## 2021-10-16 DIAGNOSIS — I5022 Chronic systolic (congestive) heart failure: Secondary | ICD-10-CM

## 2021-10-16 DIAGNOSIS — Z95811 Presence of heart assist device: Secondary | ICD-10-CM

## 2021-10-16 DIAGNOSIS — Z7901 Long term (current) use of anticoagulants: Secondary | ICD-10-CM | POA: Diagnosis not present

## 2021-10-16 DIAGNOSIS — I517 Cardiomegaly: Secondary | ICD-10-CM | POA: Diagnosis not present

## 2021-10-16 LAB — CBC
HCT: 43.9 % (ref 39.0–52.0)
Hemoglobin: 14 g/dL (ref 13.0–17.0)
MCH: 29.2 pg (ref 26.0–34.0)
MCHC: 31.9 g/dL (ref 30.0–36.0)
MCV: 91.6 fL (ref 80.0–100.0)
Platelets: 267 10*3/uL (ref 150–400)
RBC: 4.79 MIL/uL (ref 4.22–5.81)
RDW: 19.9 % — ABNORMAL HIGH (ref 11.5–15.5)
WBC: 7.2 10*3/uL (ref 4.0–10.5)
nRBC: 0 % (ref 0.0–0.2)

## 2021-10-16 LAB — BASIC METABOLIC PANEL
Anion gap: 8 (ref 5–15)
BUN: 12 mg/dL (ref 8–23)
CO2: 25 mmol/L (ref 22–32)
Calcium: 9.3 mg/dL (ref 8.9–10.3)
Chloride: 107 mmol/L (ref 98–111)
Creatinine, Ser: 1.06 mg/dL (ref 0.61–1.24)
GFR, Estimated: >60 mL/min (ref >60)
Glucose, Bld: 136 mg/dL — ABNORMAL HIGH (ref 70–99)
Potassium: 4.4 mmol/L (ref 3.5–5.1)
Sodium: 140 mmol/L (ref 135–145)

## 2021-10-16 LAB — PROTIME-INR
INR: 2.7 — ABNORMAL HIGH (ref 0.8–1.2)
Prothrombin Time: 28.1 seconds — ABNORMAL HIGH (ref 11.4–15.2)

## 2021-10-16 LAB — LACTATE DEHYDROGENASE: LDH: 328 U/L — ABNORMAL HIGH (ref 98–192)

## 2021-10-16 NOTE — Progress Notes (Signed)
Patient ID: Scott [name], male   DOB: 04-Jan-1946, 76 y.o.   MRN: [id number] ? ? ? ?LVAD CLINIC NOTE ? ?Primary Cardiologist: Dr Haroldine Laws ?INR : [place] ? ?HPI: ?Scott [name] is a 76 y/o male with h/o CAD with ankylosing spondylitis, DM2, carotid stenting, severe ischemic CM s/p anterior STEM with VT arrest in 12/15, large PE with pulmonary infarct in 2/15 s/p IVC filter and systolic HF with EF 38%. ? ?He underwent HM II LVAD placement on 09/20/13. He had a prolonged hospital course due to recurrent chest bleeding, severe CO2 retention and RV failure. ? ?In 8/18 admitted with acute gallstone pancreatitis. Underwent lap chole 02/21/17.  ? ?Admitted 2/18 with elevated LDH. He was started on bivalirudin. LDH went down [id number].  ? ?He presents for an acute visit. Over the last 48-72 hours he has had a small amount hemoptysis. Says he coughed and had some blood on the tissue.  No other complaints. No dizziness/nausea.  fine. Denies SOB/PND/Orthopnea. Appetite ok. No fever or chills. Weight at home has been stable. INR on 4/5 3.3 . Taking all medications. ?  ?VAD Indication: ?Destination Therapy patient choice   ?  ?VAD interrogation & Equipment Management: ?Speed: 9200 ?Flow: 4.8  ?Power:  5.4w    ?PI: 4.7  ?  ?Alarms: Low vollt hazard 4/14, 4/16, 4/18  ?Events: rare  ? ?Fixed speed 9200 ?Low speed limit: 8600 ?  ?Primary controller: Replace back-up battery in 17 months ?Back up controller: Replace back-up battery in 15 months  ?  ?Annual Equipment Maintenance on UBC/PM was performed on 08/30/2020. ?  ?I reviewed the LVAD parameters from today and compared the results to the patient's prior recorded data. LVAD interrogation was NEGATIVE for significant power changes, NEGATIVE for clinical alarms and STABLE for PI events/speed drops. No programming changes were made and pump is functioning within specified parameters. Pt is performing daily controller and system monitor self tests along with completing weekly and  monthly maintenance for LVAD equipment. ? ?SH:  ?Social History  ? ?Socioeconomic History  ? Marital status: Married  ?  Spouse name: Not on file  ? Number of children: Not on file  ? Years of education: Not on file  ? Highest education level: Not on file  ?Occupational History  ? Occupation: retired  ?Tobacco Use  ? Smoking status: Former  ?  Packs/day: 0.30  ?  Years: 0.00  ?  Pack years: 0.00  ?  Types: Cigarettes  ?  Quit date: 06/12/1969  ?  Years since quitting: 52.3  ? Smokeless tobacco: Never  ? Tobacco comments:  ?  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  ?Vaping Use  ? Vaping Use: Former  ?Substance and Sexual Activity  ? Alcohol use: No  ?  Alcohol/week: 0.0 standard drinks  ? Drug use: No  ? Sexual activity: Not Currently  ?Other Topics Concern  ? Not on file  ?Social History Narrative  ? Not on file  ? ?Social Determinants of Health  ? ?Financial Resource Strain: Not on file  ?Food Insecurity: Not on file  ?Transportation Needs: Not on file  ?Physical Activity: Not on file  ?Stress: Not on file  ?Social Connections: Not on file  ?Intimate Partner Violence: Not on file  ? ? ?FH:  ?Family History  ?Problem Relation Age of Onset  ? Hypertension Father   ? Heart attack Father   ? Heart Problems Sister   ? ? ?Past Medical History:  ?  Diagnosis Date  ? Acute on chronic respiratory failure ([place])   ? a. 08/2014 in setting of PE.  ? Ankylosing spondylitis ([place])   ? Bilateral pulmonary embolism (HCC)   ? a. 08/2014 - started on Coumadin. Retrievable IVC filter placed 08/27/14 due to RV strain and large clot burden.  ? CAD (coronary artery disease)   ? a. stenting of LCx 2013; b. STEMI 06/12/14 s/p PCI to LAD complicated by post cath shock requiring IABP; VT s/p DCCV, EF 20%; c. NSTEMI 06/26/14 treated medically.  ? Carotid artery disease ([place])   ? a. s/p stenting.  ? Chronic systolic CHF (congestive heart failure) ([place])   ? Diabetes ([place])   ? Hemoptysis   ? a. 08/2014 possibly due to pulm infarct/PE.  ?  Hyperlipidemia   ? Hypertension   ? Hypotension   ? Ischemic cardiomyopathy   ? a. echo 08/23/2014 EF <20%, dilated CM, mod MR/TR  ? Leukocytosis   ? Leukocytosis 09/10/2014  ? Nephrolithiasis   ? Pleural effusion on right 08/2014 - small  ? Reactive thrombocytosis 09/10/2014  ? Right leg DVT ([place])   ? a. 08/2014.  ? Ventricular tachycardia ([place])   ? a. 06/2014 at time of MI, s/p DCCV.  ? ?Current Outpatient Medications  ?Medication Sig Dispense Refill  ? acetaminophen (TYLENOL) 500 MG tablet Take 1,000 mg by mouth every 6 (six) hours as needed for moderate pain.     ? aspirin EC 81 MG tablet Take 1 tablet (81 mg total) by mouth daily. 90 tablet 3  ? cephALEXin (KEFLEX) 500 MG capsule Take 1 capsule (500 mg total) by mouth 2 (two) times daily. 90 capsule 6  ? clobetasol ointment (TEMOVATE) 0.86 % Apply 1 application topically 2 (two) times daily. 30 g 2  ? clopidogrel (PLAVIX) 75 MG tablet Take 1 tablet (75 mg total) by mouth daily. 90 tablet 3  ? cyclobenzaprine (FLEXERIL) 5 MG tablet Take 1 tablet (5 mg total) by mouth 3 (three) times daily as needed for muscle spasms. 90 tablet 5  ? furosemide (LASIX) 20 MG tablet Take 1 tablet (20 mg total) by mouth daily. Take 20 mg daily; may take 40 mg if weight gain 120 tablet 3  ? pantoprazole (PROTONIX) 40 MG tablet Take 1 tablet (40 mg total) by mouth at bedtime. 90 tablet 3  ? potassium chloride SA (KLOR-CON) 20 MEQ tablet Every other day alternate 20 mEq (1 tablet) with 40 mEq (2 tablets) (Patient taking differently: 20 mEq daily. Every other day alternate 20 mEq (1 tablet) with 40 mEq (2 tablets)) 135 tablet 3  ? rosuvastatin (CRESTOR) 10 MG tablet Take 10 mg by mouth 3 (three) times a week.    ? sildenafil (VIAGRA) 100 MG tablet Take 1 tablet (100 mg total) by mouth daily as needed for erectile dysfunction. 10 tablet 3  ? traMADol (ULTRAM) 50 MG tablet Take 2 tablets (100 mg total) by mouth every 6 (six) hours as needed for moderate pain. 120 tablet 3  ? traZODone  (DESYREL) 100 MG tablet Take 2 tablets (200 mg total) by mouth at bedtime. 180 tablet 3  ? warfarin (JANTOVEN) 6 MG tablet TAKE 1 TABLET DAILY ,      EXCEPT TAKE 1 AND 1/2      TABLETS ('9MG'$ ) ON WEDNESDAY. 100 tablet 11  ? ?No current facility-administered medications for this encounter.  ? ? ?Vitals:  ? 10/16/21 1037  ?BP: (!) 94/53  ?Pulse: 75  ?Temp: (!) 97.4 ?F (  36.3 ?C)  ?SpO2: 95%  ? ? Weight  ?Wt Readings from Last 3 Encounters:  ?09/13/21 89.5 kg (197 lb 6.4 oz)  ?07/27/21 85.7 kg (189 lb)  ?06/14/21 89.2 kg (196 lb 9.6 oz)  ? ?Physical Exam: ?GENERAL: Well appearing, male who presents to clinic today in no acute distress. ?HEENT: normal  ?NECK: Supple, JVP  .  2+ bilaterally, no bruits.  No lymphadenopathy or thyromegaly appreciated.   ?CARDIAC:  Mechanical heart sounds with LVAD hum present.  ?LUNGS:  Clear to auscultation bilaterally.  ?ABDOMEN:  Soft, round, nontender, positive bowel sounds x4.     ?LVAD exit site: well-healed and incorporated.  Dressing dry and intact.  No erythema or drainage.  Stabilization device present and accurately applied.  Driveline dressing is being changed daily per sterile technique. ?EXTREMITIES:  Warm and dry, no cyanosis, clubbing, rash or edema  ?NEUROLOGIC:  Alert and oriented x 4.  Gait steady.  No aphasia.  No dysarthria.  Affect pleasant.    ? ? ?ASSESSMENT & PLAN: ? ?1. Chronic systolic HF: Ischemic cardiomyopathy, EF 20% s/p HMII LVAD 09/20/13.   ?- Stable NYHA II. Limited mostly by arthritis. Now 8 years out ?- Volume status and MAPs stable. Continue current regimen.  ? ?2. CAD s/p Anterior STEMI on 06/12/14 with stenting of LAD:  ?- No s/s ischemia ?- Off ASA.  ?- Previously stopped statin but now back on crestor 3x/week. No change ? ?3. DL infection, chronic ?- Having small amount of bloody exudate ?-His wife is changing the dressing every 3 days.  ?4 VAD management:  ?- Had admit in 08/2017 for elevated LDH.  ?- LDH stabilized 300-380 range. Concern has been for  chronic pump clot.  ?- Check LDH/INR  ?- Off asa. On coumadin.  ?-  Will try to keep INR 2.3- 2.8. Discussed dosing with PharmD personally. Check INR ?- Continue Keflex as above for DL ?- VAD interrogated personally.

## 2021-10-16 NOTE — Progress Notes (Addendum)
Patient presents for sick visit in [facility] today with wife. Reports no problems with VAD equipment.  ? ?Patient reports he feels "pretty good" other than feeling tired from lack of sleep. Reports he has not slept since Saturday. [name] is asking about tapering off Trazodone, and starting pt on Valerian Root for sleep. Discussed with [name] PharmD- recommends decreasing Trazodone by 50 mg every 1-2 weeks until able to stop if he does not wish to take this anymore. Med interaction check completed with Valerian root- no interactions noted. Per Ander Purpura pt may take this if he wishes ? ?Presents to clinic today with hemoptysis since Saturday. Denies shortness of breath, night sweats, fevers, or chills. Reports occasional lightheadedness when turning quickly. Chest xray obtained and reviewed by Dr Haroldine Laws.  ? ?Impression:  ?Overall improved aeration when compared to the prior plain film of ?2018, with persisting basilar linear atelectasis/scarring, and no ?definite evidence of acute cardiopulmonary disease. ?  ?Surgical changes of median sternotomy and LVAD ? ?Pt to call VAD coordinators on Wednesday to let us know if he is still experiencing hemoptysis. If still bleeding will schedule for CT chest for further evaluation. Pt and wife verbalized understanding. INR 2.7 today. Hgb stable at 14.0.  ? ?Per [name] pt's drive line with increased bloody drainage, and she is having to change dressing every 3 days. He is taking Keflex 500 mg BID. They both attribute drainage to pt being restless at night and not sleeping well. They declined VAD coordinator to assess drive line today.  ? ?Vital Signs:  ?Temp: 97.4 ?Doppler Pressure:  ?Automatc BP: 94/53 (76) ?HR: 75 ?SPO2: 95% RA ?  ?Weight: 197.4 lb w/o eqt ?Last weight: 196.6 lbs ? ?VAD Indication: ?Destination Therapy patient choice   ?  ?VAD interrogation & Equipment Management: ?Speed: 9200 ?Flow: 4.8 ?Power:  5.4w    ?PI: 4.7 ?  ?Alarms: multiple LV advisories. Low voltage  hazards 4/8, 4/14/ 4/16.  ?Events: rare  ? ?Fixed speed 9200 ?Low speed limit: 8600 ?  ?Primary controller: Replace back-up battery in 16 months ?Back up controller: Replace back-up battery in 15 months  ? ?Annual Equipment Maintenance on UBC/PM was performed on 08/30/2021. ?  ?I reviewed the LVAD parameters from today and compared the results to the patient's prior recorded data. LVAD interrogation was NEGATIVE for significant power changes, NEGATIVE for clinical alarms and STABLE for PI events/speed drops. No programming changes were made and pump is functioning within specified parameters. Pt is performing daily controller and system monitor self tests along with completing weekly and monthly maintenance for LVAD equipment. ?  ?LVAD equipment check completed and is in good working order. Back-up equipment present.  ?  ?Exit Site Care: ?Drive line is being maintained weekly or as needed by his wife using daily dressing kits. Dressing dry and intact. Stabilization device present and accurately applied. Pt denies fever or chills. Pt has adequate supplies for home use.  ? ?Significant Events on VAD Support:  ?02/21/17> lap chole ?  ?Device: none ? ? ?BP & Labs:  ?MAP  - Doppler is reflecting modified systolic ?  ?Hgb 14.0 - No S/S of bleeding. Specifically denies melena/BRBPR or nosebleeds. ?  ?LDH and is within established baseline of 250- 360. Pt denies tea-colored urine. No power elevations noted on interrogation. We will continue to check this with his INR.  ? ?Patient Instructions:  ?Per [name] PharmD- she recommends decreasing Trazodone by 50 mg every 1-2 weeks until able to stop if you  do not wish to take this anymore ?Per Ander Purpura PharmD- you may take Valerian Root for sleep. Typical dose is 400-600 mg before bed, so would recommend starting off with a 450 mg or 500 mg tablet ?Coumadin dosing per Ander Purpura PharmD ?4.   Return to [place] clinic for previously scheduled appt ?5.   Call VAD coordinators on Wednesday to  let us know if bloody sputum has resolved ? ?Emerson Monte RN ?VAD Coordinator  ?Office: [phone]  ?24/7 Pager: [phone]  ? ? ? ? ?

## 2021-10-16 NOTE — Patient Instructions (Addendum)
Per Ander Purpura PharmD- she recommends decreasing Trazodone by 50 mg every 1-2 weeks until able to stop if you do not wish to take this anymore ?Per Ander Purpura PharmD- you may take Valerian Root for sleep. Typical dose is 400-600 mg before bed, so would recommend starting off with a 450 mg or 500 mg tablet ?Coumadin dosing per Ander Purpura PharmD ?4.   Return to [place] clinic for previously scheduled appt ?5.   Call VAD coordinators on Wednesday to let us know if bloody sputum has resolved ? ?

## 2021-10-16 NOTE — Progress Notes (Signed)
LVAD INR 

## 2021-10-18 ENCOUNTER — Telehealth (HOSPITAL_COMMUNITY): Payer: Self-pay | Admitting: *Deleted

## 2021-10-18 NOTE — Telephone Encounter (Signed)
Received call from patient's wife [name] regarding hemoptysis. Reports this has resolved over the last few days. Also reports drive line is also improving, and she is now able to change dressing every 4 days. Advised if hemoptysis reoccurs to please notify VAD coordinators. She verbalized understanding.  ? ?Emerson Monte RN ?VAD Coordinator  ?Office: [phone]  ?24/7 Pager: [phone]  ? ?

## 2021-10-25 ENCOUNTER — Other Ambulatory Visit (HOSPITAL_COMMUNITY): Payer: Medicare Other

## 2021-10-27 ENCOUNTER — Telehealth (HOSPITAL_COMMUNITY): Payer: Self-pay | Admitting: *Deleted

## 2021-10-27 ENCOUNTER — Other Ambulatory Visit (HOSPITAL_COMMUNITY): Payer: Medicare Other

## 2021-10-27 ENCOUNTER — Other Ambulatory Visit (HOSPITAL_COMMUNITY): Payer: Self-pay | Admitting: *Deleted

## 2021-10-27 DIAGNOSIS — I5041 Acute combined systolic (congestive) and diastolic (congestive) heart failure: Secondary | ICD-10-CM

## 2021-10-27 DIAGNOSIS — Z7901 Long term (current) use of anticoagulants: Secondary | ICD-10-CM

## 2021-10-27 DIAGNOSIS — Z95811 Presence of heart assist device: Secondary | ICD-10-CM

## 2021-10-27 DIAGNOSIS — Z79899 Other long term (current) drug therapy: Secondary | ICD-10-CM

## 2021-10-27 DIAGNOSIS — G479 Sleep disorder, unspecified: Secondary | ICD-10-CM

## 2021-10-27 DIAGNOSIS — I5022 Chronic systolic (congestive) heart failure: Secondary | ICD-10-CM

## 2021-10-27 DIAGNOSIS — I480 Paroxysmal atrial fibrillation: Secondary | ICD-10-CM

## 2021-10-27 DIAGNOSIS — I5023 Acute on chronic systolic (congestive) heart failure: Secondary | ICD-10-CM

## 2021-10-27 MED ORDER — FUROSEMIDE 20 MG PO TABS: 20.0000 mg | ORAL_TABLET | Freq: Every day | ORAL | 3 refills | Status: DC

## 2021-10-27 NOTE — Telephone Encounter (Signed)
Wife called VAD Clinic to report patient has had "three dashes" on his VAD controller at times without alarms. She doesn't know what this means. Explained it is indicating potential Low Flow state and LOW FLOW alarms could start being heard. She does report patient has not been eating and drinking as much as usual. Patient himself reports he is "so tired and weak". ? ?Wife attributes above to his "coming off Trazadone" and not sleeping for last two nights. He has weaned down to "half tablet at night". Wife reports he has been having "cold sweats, restlessness, and low body temperature - 94, 95".  ? ?Pt says he is no longer coughing up blood as reported last visit. He just feels tired and weak. Denies any fevers or chills. Says his DL exit site continues to have bloody drainage and wife is changing every few days.  ? ?Updated Dr. Haroldine Laws. Advised patient to come to ED now for labs, full assessment, and IV fluid. He does not want to come at this point. Informed him to hold Lasix, increase fluid and salt intake. Full clinic appt made for Monday at 9:00 am. If symptoms become worse or do not improve he is to come to [facility] ED along with calling VAD pager. Pt and wife verbalized understanding of same. ? ?Zada Girt RN, VAD Coordinator ?[phone] ?24/7 VAD Pager: [phone] ?

## 2021-10-27 NOTE — Addendum Note (Signed)
Addended by: Zada Girt B on: 10/27/2021 01:42 PM ? ? Modules accepted: Orders ? ?

## 2021-10-30 ENCOUNTER — Inpatient Hospital Stay (HOSPITAL_COMMUNITY)
Admission: EM | Admit: 2021-10-30 | Discharge: 2021-11-03 | DRG: 308 | Disposition: A | Discharge status: Home Health | Payer: Medicare Other | Source: Ambulatory Visit | Attending: Internal Medicine | Admitting: Internal Medicine

## 2021-10-30 ENCOUNTER — Inpatient Hospital Stay: Payer: Self-pay

## 2021-10-30 ENCOUNTER — Ambulatory Visit (HOSPITAL_COMMUNITY)
Admission: RE | Admit: 2021-10-30 | Discharge: 2021-10-30 | Disposition: A | Discharge status: Home / Self Care | Payer: Medicare Other | Source: Ambulatory Visit | Attending: Cardiology | Admitting: Cardiology

## 2021-10-30 DIAGNOSIS — I2510 Atherosclerotic heart disease of native coronary artery without angina pectoris: Secondary | ICD-10-CM | POA: Diagnosis present

## 2021-10-30 DIAGNOSIS — I252 Old myocardial infarction: Secondary | ICD-10-CM

## 2021-10-30 DIAGNOSIS — R570 Cardiogenic shock: Secondary | ICD-10-CM | POA: Diagnosis present

## 2021-10-30 DIAGNOSIS — E119 Type 2 diabetes mellitus without complications: Secondary | ICD-10-CM | POA: Diagnosis present

## 2021-10-30 DIAGNOSIS — E785 Hyperlipidemia, unspecified: Secondary | ICD-10-CM | POA: Diagnosis present

## 2021-10-30 DIAGNOSIS — N179 Acute kidney failure, unspecified: Secondary | ICD-10-CM | POA: Diagnosis present

## 2021-10-30 DIAGNOSIS — Z8674 Personal history of sudden cardiac arrest: Secondary | ICD-10-CM

## 2021-10-30 DIAGNOSIS — Z7901 Long term (current) use of anticoagulants: Secondary | ICD-10-CM

## 2021-10-30 DIAGNOSIS — M1990 Unspecified osteoarthritis, unspecified site: Secondary | ICD-10-CM | POA: Diagnosis present

## 2021-10-30 DIAGNOSIS — Z7902 Long term (current) use of antithrombotics/antiplatelets: Secondary | ICD-10-CM | POA: Diagnosis not present

## 2021-10-30 DIAGNOSIS — Z95811 Presence of heart assist device: Secondary | ICD-10-CM

## 2021-10-30 DIAGNOSIS — F4321 Adjustment disorder with depressed mood: Secondary | ICD-10-CM | POA: Diagnosis present

## 2021-10-30 DIAGNOSIS — M459 Ankylosing spondylitis of unspecified sites in spine: Secondary | ICD-10-CM | POA: Diagnosis present

## 2021-10-30 DIAGNOSIS — I5041 Acute combined systolic (congestive) and diastolic (congestive) heart failure: Secondary | ICD-10-CM

## 2021-10-30 DIAGNOSIS — I110 Hypertensive heart disease with heart failure: Secondary | ICD-10-CM | POA: Diagnosis present

## 2021-10-30 DIAGNOSIS — G479 Sleep disorder, unspecified: Secondary | ICD-10-CM

## 2021-10-30 DIAGNOSIS — Z86711 Personal history of pulmonary embolism: Secondary | ICD-10-CM

## 2021-10-30 DIAGNOSIS — I5022 Chronic systolic (congestive) heart failure: Secondary | ICD-10-CM | POA: Diagnosis not present

## 2021-10-30 DIAGNOSIS — I255 Ischemic cardiomyopathy: Secondary | ICD-10-CM | POA: Diagnosis present

## 2021-10-30 DIAGNOSIS — Z87442 Personal history of urinary calculi: Secondary | ICD-10-CM

## 2021-10-30 DIAGNOSIS — R0609 Other forms of dyspnea: Secondary | ICD-10-CM

## 2021-10-30 DIAGNOSIS — I5023 Acute on chronic systolic (congestive) heart failure: Secondary | ICD-10-CM | POA: Diagnosis present

## 2021-10-30 DIAGNOSIS — N200 Calculus of kidney: Secondary | ICD-10-CM | POA: Diagnosis present

## 2021-10-30 DIAGNOSIS — Y838 Other surgical procedures as the cause of abnormal reaction of the patient, or of later complication, without mention of misadventure at the time of the procedure: Secondary | ICD-10-CM | POA: Diagnosis present

## 2021-10-30 DIAGNOSIS — I472 Ventricular tachycardia, unspecified: Secondary | ICD-10-CM | POA: Diagnosis not present

## 2021-10-30 DIAGNOSIS — Z79899 Other long term (current) drug therapy: Secondary | ICD-10-CM | POA: Diagnosis not present

## 2021-10-30 DIAGNOSIS — Z95828 Presence of other vascular implants and grafts: Secondary | ICD-10-CM

## 2021-10-30 DIAGNOSIS — Z955 Presence of coronary angioplasty implant and graft: Secondary | ICD-10-CM | POA: Diagnosis not present

## 2021-10-30 DIAGNOSIS — Z7982 Long term (current) use of aspirin: Secondary | ICD-10-CM | POA: Diagnosis not present

## 2021-10-30 DIAGNOSIS — I493 Ventricular premature depolarization: Secondary | ICD-10-CM | POA: Diagnosis not present

## 2021-10-30 DIAGNOSIS — T827XXD Infection and inflammatory reaction due to other cardiac and vascular devices, implants and grafts, subsequent encounter: Secondary | ICD-10-CM | POA: Diagnosis not present

## 2021-10-30 DIAGNOSIS — Z888 Allergy status to other drugs, medicaments and biological substances status: Secondary | ICD-10-CM | POA: Diagnosis not present

## 2021-10-30 DIAGNOSIS — Z86718 Personal history of other venous thrombosis and embolism: Secondary | ICD-10-CM

## 2021-10-30 DIAGNOSIS — Z8249 Family history of ischemic heart disease and other diseases of the circulatory system: Secondary | ICD-10-CM

## 2021-10-30 LAB — LACTIC ACID, PLASMA
Lactic Acid, Venous: 6 mmol/L (ref 0.5–1.9)
Lactic Acid, Venous: 2.9 mmol/L (ref 0.5–1.9)

## 2021-10-30 LAB — BASIC METABOLIC PANEL
Anion gap: 10 (ref 5–15)
BUN: 100 mg/dL — ABNORMAL HIGH (ref 8–23)
CO2: 23 mmol/L (ref 22–32)
Calcium: 9.4 mg/dL (ref 8.9–10.3)
Chloride: 105 mmol/L (ref 98–111)
Creatinine, Ser: 1.98 mg/dL — ABNORMAL HIGH (ref 0.61–1.24)
GFR, Estimated: 35 mL/min — ABNORMAL LOW (ref >60)
Glucose, Bld: 127 mg/dL — ABNORMAL HIGH (ref 70–99)
Potassium: 5.8 mmol/L — ABNORMAL HIGH (ref 3.5–5.1)
Sodium: 138 mmol/L (ref 135–145)

## 2021-10-30 LAB — POCT I-STAT 7, (LYTES, BLD GAS, ICA,H+H)
Acid-base deficit: 6 mmol/L — ABNORMAL HIGH (ref 0.0–2.0)
Bicarbonate: 22.8 mmol/L (ref 20.0–28.0)
Calcium, Ion: 1.27 mmol/L (ref 1.15–1.40)
HCT: 43 % (ref 39.0–52.0)
Hemoglobin: 14.6 g/dL (ref 13.0–17.0)
O2 Saturation: 100 %
Potassium: 5.5 mmol/L — ABNORMAL HIGH (ref 3.5–5.1)
Sodium: 134 mmol/L — ABNORMAL LOW (ref 135–145)
TCO2: 25 mmol/L (ref 22–32)
pCO2 arterial: 55.9 mmHg — ABNORMAL HIGH (ref 32–48)
pH, Arterial: 7.219 — ABNORMAL LOW (ref 7.35–7.45)
pO2, Arterial: 358 mmHg — ABNORMAL HIGH (ref 83–108)

## 2021-10-30 LAB — POCT I-STAT, CHEM 8
BUN: 97 mg/dL — ABNORMAL HIGH (ref 8–23)
Calcium, Ion: 1.3 mmol/L (ref 1.15–1.40)
Chloride: 101 mmol/L (ref 98–111)
Creatinine, Ser: 1.7 mg/dL — ABNORMAL HIGH (ref 0.61–1.24)
Glucose, Bld: 103 mg/dL — ABNORMAL HIGH (ref 70–99)
HCT: 42 % (ref 39.0–52.0)
Hemoglobin: 14.3 g/dL (ref 13.0–17.0)
Potassium: 5 mmol/L (ref 3.5–5.1)
Sodium: 138 mmol/L (ref 135–145)
TCO2: 32 mmol/L (ref 22–32)

## 2021-10-30 LAB — COMPREHENSIVE METABOLIC PANEL
ALT: 1170 U/L — ABNORMAL HIGH (ref 0–44)
AST: 1106 U/L — ABNORMAL HIGH (ref 15–41)
Albumin: 4.2 g/dL (ref 3.5–5.0)
Alkaline Phosphatase: 142 U/L — ABNORMAL HIGH (ref 38–126)
Anion gap: 16 — ABNORMAL HIGH (ref 5–15)
BUN: 107 mg/dL — ABNORMAL HIGH (ref 8–23)
CO2: 20 mmol/L — ABNORMAL LOW (ref 22–32)
Calcium: 10 mg/dL (ref 8.9–10.3)
Chloride: 99 mmol/L (ref 98–111)
Creatinine, Ser: 2.69 mg/dL — ABNORMAL HIGH (ref 0.61–1.24)
GFR, Estimated: 24 mL/min — ABNORMAL LOW (ref >60)
Glucose, Bld: 122 mg/dL — ABNORMAL HIGH (ref 70–99)
Potassium: 5.4 mmol/L — ABNORMAL HIGH (ref 3.5–5.1)
Sodium: 135 mmol/L (ref 135–145)
Total Bilirubin: 4.6 mg/dL — ABNORMAL HIGH (ref 0.3–1.2)
Total Protein: 7.9 g/dL (ref 6.5–8.1)

## 2021-10-30 LAB — MAGNESIUM: Magnesium: 2.8 mg/dL — ABNORMAL HIGH (ref 1.7–2.4)

## 2021-10-30 LAB — PROTIME-INR
INR: >10 (ref 0.8–1.2)
INR: 4 — ABNORMAL HIGH (ref 0.8–1.2)
Prothrombin Time: >90 seconds — ABNORMAL HIGH (ref 11.4–15.2)
Prothrombin Time: 38.6 seconds — ABNORMAL HIGH (ref 11.4–15.2)

## 2021-10-30 LAB — CBC
HCT: 50.2 % (ref 39.0–52.0)
HCT: 42.5 % (ref 39.0–52.0)
Hemoglobin: 15.4 g/dL (ref 13.0–17.0)
Hemoglobin: 13.1 g/dL (ref 13.0–17.0)
MCH: 29.4 pg (ref 26.0–34.0)
MCH: 28.9 pg (ref 26.0–34.0)
MCHC: 30.7 g/dL (ref 30.0–36.0)
MCHC: 30.8 g/dL (ref 30.0–36.0)
MCV: 95.8 fL (ref 80.0–100.0)
MCV: 93.6 fL (ref 80.0–100.0)
Platelets: 226 10*3/uL (ref 150–400)
Platelets: 167 10*3/uL (ref 150–400)
RBC: 5.24 MIL/uL (ref 4.22–5.81)
RBC: 4.54 MIL/uL (ref 4.22–5.81)
RDW: 20.2 % — ABNORMAL HIGH (ref 11.5–15.5)
RDW: 19.6 % — ABNORMAL HIGH (ref 11.5–15.5)
WBC: 15.3 10*3/uL — ABNORMAL HIGH (ref 4.0–10.5)
WBC: 15.5 10*3/uL — ABNORMAL HIGH (ref 4.0–10.5)
nRBC: 3.3 % — ABNORMAL HIGH (ref 0.0–0.2)
nRBC: 1.8 % — ABNORMAL HIGH (ref 0.0–0.2)

## 2021-10-30 LAB — LACTATE DEHYDROGENASE
LDH: 1093 U/L — ABNORMAL HIGH (ref 98–192)
LDH: 1263 U/L — ABNORMAL HIGH (ref 98–192)

## 2021-10-30 LAB — MRSA NEXT GEN BY PCR, NASAL: MRSA by PCR Next Gen: NOT DETECTED

## 2021-10-30 MED ORDER — PANTOPRAZOLE SODIUM 40 MG PO TBEC
40.0000 mg | DELAYED_RELEASE_TABLET | Freq: Every day | ORAL | Status: DC
  Administered 2021-10-31 – 2021-11-03 (×4): 40 mg via ORAL
  Filled 2021-10-30 (×4): qty 1

## 2021-10-30 MED ORDER — LACTATED RINGERS IV BOLUS
500.0000 mL | Freq: Once | INTRAVENOUS | Status: AC
  Administered 2021-10-30: 500 mL via INTRAVENOUS

## 2021-10-30 MED ORDER — ASPIRIN EC 81 MG PO TBEC
81.0000 mg | DELAYED_RELEASE_TABLET | Freq: Every day | ORAL | Status: DC
  Administered 2021-10-31 – 2021-11-03 (×4): 81 mg via ORAL
  Filled 2021-10-30 (×4): qty 1

## 2021-10-30 MED ORDER — MIDAZOLAM HCL 2 MG/2ML IJ SOLN
INTRAMUSCULAR | Status: AC | PRN
  Administered 2021-10-30: 1 mg via INTRAVENOUS

## 2021-10-30 MED ORDER — VITAMIN K1 10 MG/ML IJ SOLN
2.0000 mg | Freq: Once | INTRAVENOUS | Status: AC
  Administered 2021-10-30: 2 mg via INTRAVENOUS
  Filled 2021-10-30: qty 0.2

## 2021-10-30 MED ORDER — NOREPINEPHRINE 4 MG/250ML-% IV SOLN: 2.0000 ug/min | INTRAVENOUS | Status: DC

## 2021-10-30 MED ORDER — ONDANSETRON HCL 4 MG/2ML IJ SOLN
4.0000 mg | Freq: Four times a day (QID) | INTRAMUSCULAR | Status: DC | PRN
Start: 2021-10-30 — End: 2021-11-03

## 2021-10-30 MED ORDER — ETOMIDATE 2 MG/ML IV SOLN
INTRAVENOUS | Status: AC | PRN
  Administered 2021-10-30: 16 mg via INTRAVENOUS

## 2021-10-30 MED ORDER — SODIUM CHLORIDE 0.9% IV SOLUTION: Freq: Once | INTRAVENOUS | Status: AC

## 2021-10-30 MED ORDER — AMIODARONE HCL IN DEXTROSE 360-4.14 MG/200ML-% IV SOLN
30.0000 mg/h | INTRAVENOUS | Status: DC
  Administered 2021-10-31 – 2021-11-01 (×3): 30 mg/h via INTRAVENOUS
  Filled 2021-10-30 (×3): qty 200

## 2021-10-30 MED ORDER — LACTATED RINGERS IV SOLN
INTRAVENOUS | Status: AC | PRN
Start: 2021-10-30 — End: 2021-10-30
  Administered 2021-10-30: 999 mL/h via INTRAVENOUS

## 2021-10-30 MED ORDER — SODIUM CHLORIDE 0.9 % IV SOLN
250.0000 mL | INTRAVENOUS | Status: DC
Start: 2021-10-30 — End: 2021-11-03

## 2021-10-30 MED ORDER — CEPHALEXIN 500 MG PO CAPS
500.0000 mg | ORAL_CAPSULE | Freq: Two times a day (BID) | ORAL | Status: DC
  Administered 2021-10-30 – 2021-11-03 (×8): 500 mg via ORAL
  Filled 2021-10-30 (×10): qty 1

## 2021-10-30 MED ORDER — SODIUM ZIRCONIUM CYCLOSILICATE 10 G PO PACK
10.0000 g | PACK | Freq: Once | ORAL | Status: DC
  Filled 2021-10-30: qty 1

## 2021-10-30 MED ORDER — ORAL CARE MOUTH RINSE
15.0000 mL | Freq: Two times a day (BID) | OROMUCOSAL | Status: DC
  Administered 2021-10-31: 15 mL via OROMUCOSAL

## 2021-10-30 MED ORDER — MILRINONE LACTATE IN DEXTROSE 20-5 MG/100ML-% IV SOLN
0.1250 ug/kg/min | INTRAVENOUS | Status: DC
  Administered 2021-10-30 – 2021-11-01 (×3): 0.125 ug/kg/min via INTRAVENOUS
  Filled 2021-10-30 (×4): qty 100

## 2021-10-30 MED ORDER — CYCLOBENZAPRINE HCL 10 MG PO TABS
5.0000 mg | ORAL_TABLET | Freq: Two times a day (BID) | ORAL | Status: DC | PRN
  Administered 2021-10-31: 5 mg via ORAL
  Filled 2021-10-30 (×2): qty 1

## 2021-10-30 MED ORDER — SODIUM CHLORIDE 0.9% FLUSH: 10.0000 mL | INTRAVENOUS | Status: DC | PRN

## 2021-10-30 MED ORDER — PHENYLEPHRINE 80 MCG/ML (10ML) SYRINGE FOR IV PUSH (FOR BLOOD PRESSURE SUPPORT)
PREFILLED_SYRINGE | INTRAVENOUS | Status: AC | PRN
  Administered 2021-10-30: 320 ug via INTRAVENOUS
  Administered 2021-10-30: 240 ug via INTRAVENOUS

## 2021-10-30 MED ORDER — AMIODARONE HCL IN DEXTROSE 360-4.14 MG/200ML-% IV SOLN
60.0000 mg/h | INTRAVENOUS | Status: AC
  Administered 2021-10-30: 60 mg/h via INTRAVENOUS
  Filled 2021-10-30: qty 200

## 2021-10-30 MED ORDER — ACETAMINOPHEN 325 MG PO TABS: 650.0000 mg | ORAL_TABLET | ORAL | Status: DC | PRN

## 2021-10-30 MED ORDER — TRAMADOL HCL 50 MG PO TABS: 50.0000 mg | ORAL_TABLET | Freq: Two times a day (BID) | ORAL | Status: DC | PRN

## 2021-10-30 MED ORDER — SODIUM CHLORIDE 0.9% FLUSH
10.0000 mL | Freq: Two times a day (BID) | INTRAVENOUS | Status: DC
  Administered 2021-10-30 – 2021-11-02 (×4): 10 mL

## 2021-10-30 MED ORDER — MIDAZOLAM HCL 2 MG/2ML IJ SOLN
INTRAMUSCULAR | Status: AC
  Filled 2021-10-30: qty 2

## 2021-10-30 NOTE — H&P (Addendum)
?Advanced Heart Failure VAD History and Physical Note  ? ?PCP-Cardiologist: None  ? ?Reason for Admission: VT ? ?HPI:   ? ?Scott [name] is a 76 y/o male with h/o CAD with ankylosing spondylitis, DM2, carotid stenting, severe ischemic CM s/p anterior STEM with VT arrest in 12/15, large PE with pulmonary infarct in 2/15 s/p IVC filter and sHMII LVAD.  ? ?Started feeling bad over the weekend. Complaining of weakness. Presented to [facility] with VT.VT 270s.  Moved urgently to ED. Underwent emergent cardioversion x1. Retuned to SR with frequent PVCs.  ? ? ?LVAD INTERROGATION:  ?HeartMate II LVAD:  Flow 4.5 liters/min, speed 92300, power 5.3, PI 4.4.   ? ? ?Review of Systems: [y] = yes, '[ ]'$  = no  ? ?General: Weight gain '[ ]'$ ; Weight loss '[ ]'$ ; Anorexia '[ ]'$ ; Fatigue [Y ]; Fever '[ ]'$ ; Chills '[ ]'$ ; Weakness [Y ]  ?Cardiac: Chest pain/pressure '[ ]'$ ; Resting SOB '[ ]'$ ; Exertional SOB '[ ]'$ ; Orthopnea '[ ]'$ ; Pedal Edema '[ ]'$ ; Palpitations '[ ]'$ ; Syncope '[ ]'$ ; Presyncope '[ ]'$ ; Paroxysmal nocturnal dyspnea'[ ]'$   ?Pulmonary: Cough '[ ]'$ ; Wheezing'[ ]'$ ; Hemoptysis'[ ]'$ ; Sputum '[ ]'$ ; Snoring '[ ]'$   ?GI: Vomiting'[ ]'$ ; Dysphagia'[ ]'$ ; Melena'[ ]'$ ; Hematochezia '[ ]'$ ; Heartburn'[ ]'$ ; Abdominal pain '[ ]'$ ; Constipation '[ ]'$ ; Diarrhea '[ ]'$ ; BRBPR '[ ]'$   ?GU: Hematuria'[ ]'$ ; Dysuria '[ ]'$ ; Nocturia'[ ]'$   ?Vascular: Pain in legs with walking '[ ]'$ ; Pain in feet with lying flat '[ ]'$ ; Non-healing sores '[ ]'$ ; Stroke '[ ]'$ ; TIA '[ ]'$ ; Slurred speech '[ ]'$ ;  ?Neuro: Headaches'[ ]'$ ; Vertigo'[ ]'$ ; Seizures'[ ]'$ ; Paresthesias'[ ]'$ ;Blurred vision '[ ]'$ ; Diplopia '[ ]'$ ; Vision changes '[ ]'$   ?Ortho/Skin: Arthritis [ Y]; Joint pain [ Y]; Muscle pain '[ ]'$ ; Joint swelling '[ ]'$ ; Back Pain [ Y]; Rash '[ ]'$   ?Psych: Depression[ Y]; Anxiety'[ ]'$   ?Heme: Bleeding problems '[ ]'$ ; Clotting disorders '[ ]'$ ; Anemia '[ ]'$   ?Endocrine: Diabetes '[ ]'$ ; Thyroid dysfunction'[ ]'$  ?  ? ?Home Medications ?Prior to Admission medications   ?Medication Sig Start Date End Date Taking? Authorizing Provider  ?acetaminophen (TYLENOL) 500 MG tablet Take 1,000 mg by  mouth every 6 (six) hours as needed for moderate pain.     [provider]  ?aspirin EC 81 MG tablet Take 1 tablet (81 mg total) by mouth daily. 11/12/16   Clegg, Amy D, NP  ?cephALEXin (KEFLEX) 500 MG capsule Take 1 capsule (500 mg total) by mouth 2 (two) times daily. 12/06/20   Jahna Liebert, Shaune Pascal, MD  ?clobetasol ointment (TEMOVATE) 7.82 % Apply 1 application topically 2 (two) times daily. 03/15/21   Jaianna Nicoll, Shaune Pascal, MD  ?clopidogrel (PLAVIX) 75 MG tablet Take 1 tablet (75 mg total) by mouth daily. 09/14/21   Trena Dunavan, Shaune Pascal, MD  ?cyclobenzaprine (FLEXERIL) 5 MG tablet Take 1 tablet (5 mg total) by mouth 3 (three) times daily as needed for muscle spasms. 06/30/20   Arliss Frisina, Shaune Pascal, MD  ?furosemide (LASIX) 20 MG tablet Take 1 tablet (20 mg total) by mouth daily. Take 20 mg daily; may take 40 mg if weight gain. HOLD AS OF 10/27/21 10/27/21   Ahmya Bernick, Shaune Pascal, MD  ?pantoprazole (PROTONIX) 40 MG tablet Take 1 tablet (40 mg total) by mouth at bedtime. 06/14/21   Loye Reininger, Shaune Pascal, MD  ?potassium chloride SA (KLOR-CON) 20 MEQ tablet Every other day alternate 20 mEq (1 tablet) with 40 mEq (2 tablets) ?Patient taking differently: 20 mEq daily. Every other day alternate 20  mEq (1 tablet) with 40 mEq (2 tablets) 03/15/21   Lorn Butcher, Shaune Pascal, MD  ?rosuvastatin (CRESTOR) 10 MG tablet Take 10 mg by mouth 3 (three) times a week.    [provider]  ?sildenafil (VIAGRA) 100 MG tablet Take 1 tablet (100 mg total) by mouth daily as needed for erectile dysfunction. 08/30/20   Truxton Stupka, Shaune Pascal, MD  ?traMADol (ULTRAM) 50 MG tablet Take 2 tablets (100 mg total) by mouth every 6 (six) hours as needed for moderate pain. 08/30/20   Zelie Asbill, Shaune Pascal, MD  ?traZODone (DESYREL) 100 MG tablet Take 2 tablets (200 mg total) by mouth at bedtime. 09/14/21   Kordelia Severin, Shaune Pascal, MD  ?warfarin (JANTOVEN) 6 MG tablet TAKE 1 TABLET DAILY ,      EXCEPT TAKE 1 AND 1/2      TABLETS ('9MG'$ ) ON WEDNESDAY. 06/16/21   Vanita Cannell,  Shaune Pascal, MD  ? ? ?Past Medical History: ?Past Medical History:  ?Diagnosis Date  ? Acute on chronic respiratory failure ([place])   ? a. 08/2014 in setting of PE.  ? Ankylosing spondylitis ([place])   ? Bilateral pulmonary embolism (HCC)   ? a. 08/2014 - started on Coumadin. Retrievable IVC filter placed 08/27/14 due to RV strain and large clot burden.  ? CAD (coronary artery disease)   ? a. stenting of LCx 2013; b. STEMI 06/12/14 s/p PCI to LAD complicated by post cath shock requiring IABP; VT s/p DCCV, EF 20%; c. NSTEMI 06/26/14 treated medically.  ? Carotid artery disease ([place])   ? a. s/p stenting.  ? Chronic systolic CHF (congestive heart failure) ([place])   ? Diabetes ([place])   ? Hemoptysis   ? a. 08/2014 possibly due to pulm infarct/PE.  ? Hyperlipidemia   ? Hypertension   ? Hypotension   ? Ischemic cardiomyopathy   ? a. echo 08/23/2014 EF <20%, dilated CM, mod MR/TR  ? Leukocytosis   ? Leukocytosis 09/10/2014  ? Nephrolithiasis   ? Pleural effusion on right 08/2014 - small  ? Reactive thrombocytosis 09/10/2014  ? Right leg DVT ([place])   ? a. 08/2014.  ? Ventricular tachycardia ([place])   ? a. 06/2014 at time of MI, s/p DCCV.  ? ? ?Past Surgical History: ?Past Surgical History:  ?Procedure Laterality Date  ? BEDSIDE EVACUATION OF HEMATOMA  10/05/2014  ? Procedure: EVACUATION OF HEMATOMA;  Surgeon: Ivin Poot, MD;  Location: [place];  Service: Open Heart Surgery;;  ? CHOLECYSTECTOMY N/A 02/21/2017  ? Procedure: LAPAROSCOPIC CHOLECYSTECTOMY;  Surgeon: Rolm Bookbinder, MD;  Location: [facility];  Service: General;  Laterality: N/A;  ? INSERTION OF IMPLANTABLE LEFT VENTRICULAR ASSIST DEVICE N/A 09/21/2014  ? Procedure: INSERTION OF IMPLANTABLE LEFT VENTRICULAR ASSIST DEVICE;  Surgeon: Ivin Poot, MD;  Location: [place];  Service: Open Heart Surgery;  Laterality: N/A;  CIRC ARREST ? ?NITRIC OXIDE  ? LEFT HEART CATHETERIZATION WITH CORONARY ANGIOGRAM N/A 06/12/2014  ? Procedure: LEFT HEART CATHETERIZATION WITH CORONARY ANGIOGRAM;  Surgeon:  Lorretta Harp, MD;  Location: [facility] CATH LAB;  Service: Cardiovascular;  Laterality: N/A;  ? RIGHT HEART CATHETERIZATION N/A 09/10/2014  ? Procedure: RIGHT HEART CATH;  Surgeon: [name], MD;  Location: [facility] CATH LAB;  Service: Cardiovascular;  Laterality: N/A;  ? RIGHT HEART CATHETERIZATION N/A 09/17/2014  ? Procedure: RIGHT HEART CATH;  Surgeon: [name], MD;  Location: [facility] CATH LAB;  Service: Cardiovascular;  Laterality: N/A;  ? TEE WITHOUT CARDIOVERSION N/A 09/21/2014  ? Procedure: TRANSESOPHAGEAL ECHOCARDIOGRAM (TEE);  Surgeon: Ivin Poot, MD;  Location: [place];  Service: Open Heart Surgery;  Laterality: N/A;  ? TEE WITHOUT CARDIOVERSION N/A 10/05/2014  ? Procedure: TRANSESOPHAGEAL ECHOCARDIOGRAM (TEE);  Surgeon: Ivin Poot, MD;  Location: [place];  Service: Open Heart Surgery;  Laterality: N/A;  ? VIDEO ASSISTED THORACOSCOPY (VATS)/THOROCOTOMY Left 10/06/2014  ? Procedure: VIDEO ASSISTED THORACOSCOPY (VATS)/THOROCOTOMY;  Surgeon: Ivin Poot, MD;  Location: [place];  Service: Open Heart Surgery;  Laterality: Left;  ? ? ?Family History: ?Family History  ?Problem Relation Age of Onset  ? Hypertension Father   ? Heart attack Father   ? Heart Problems Sister   ? ? ?Social History: ?Social History  ? ?Socioeconomic History  ? Marital status: Married  ?  Spouse name: Not on file  ? Number of children: Not on file  ? Years of education: Not on file  ? Highest education level: Not on file  ?Occupational History  ? Occupation: retired  ?Tobacco Use  ? Smoking status: Former  ?  Packs/day: 0.30  ?  Years: 0.00  ?  Pack years: 0.00  ?  Types: Cigarettes  ?  Quit date: 06/12/1969  ?  Years since quitting: 52.4  ? Smokeless tobacco: Never  ? Tobacco comments:  ?  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  ?Vaping Use  ? Vaping Use: Former  ?Substance and Sexual Activity  ? Alcohol use: No  ?  Alcohol/week: 0.0 standard drinks  ? Drug use: No  ? Sexual activity: Not Currently  ?Other Topics Concern   ? Not on file  ?Social History Narrative  ? Not on file  ? ?Social Determinants of Health  ? ?Financial Resource Strain: Not on file  ?Food Insecurity: Not on file  ?Transportation Needs: Not on file

## 2021-10-30 NOTE — Progress Notes (Signed)
ANTICOAGULATION CONSULT NOTE - Initial Consult ? ?Pharmacy Consult for warfarin ?Indication:  LVAD ? ?Allergies  ?Allergen Reactions  ? Biopatch [Chlorhexidine] Dermatitis and Rash  ? ? ?Patient Measurements: ?Weight: 90 kg (198 lb 6.6 oz) ? ? ?Vital Signs: ?Temp: 94.3 ?F (34.6 ?C) (05/01 1215) ?Temp Source: Axillary (05/01 1215) ?BP: 114/102 (05/01 1200) ?Pulse Rate: 68 (05/01 1230) ? ?Labs: ?Recent Labs  ?  10/30/21 ?0909 10/30/21 ?1051  ?HGB 15.4 14.6  ?HCT 50.2 43.0  ?PLT 226  --   ?LABPROT >90.0*  --   ?INR >10.0*  --   ?CREATININE 2.69*  --   ? ? ?Estimated Creatinine Clearance: 26.6 mL/min (A) (by C-G formula based on SCr of 2.69 mg/dL (H)). ? ? ?Medical History: ?Past Medical History:  ?Diagnosis Date  ? Acute on chronic respiratory failure ([place])   ? a. 08/2014 in setting of PE.  ? Ankylosing spondylitis ([place])   ? Bilateral pulmonary embolism ([facility])   ? a. 08/2014 - started on Coumadin. Retrievable IVC filter placed 08/27/14 due to RV strain and large clot burden.  ? CAD (coronary artery disease)   ? a. stenting of LCx 2013; b. STEMI 06/12/14 s/p PCI to LAD complicated by post cath shock requiring IABP; VT s/p DCCV, EF 20%; c. NSTEMI 06/26/14 treated medically.  ? Carotid artery disease ([place])   ? a. s/p stenting.  ? Chronic systolic CHF (congestive heart failure) ([place])   ? Diabetes ([place])   ? Hemoptysis   ? a. 08/2014 possibly due to pulm infarct/PE.  ? Hyperlipidemia   ? Hypertension   ? Hypotension   ? Ischemic cardiomyopathy   ? a. echo 08/23/2014 EF <20%, dilated CM, mod MR/TR  ? Leukocytosis   ? Leukocytosis 09/10/2014  ? Nephrolithiasis   ? Pleural effusion on right 08/2014 - small  ? Reactive thrombocytosis 09/10/2014  ? Right leg DVT ([place])   ? a. 08/2014.  ? Ventricular tachycardia ([place])   ? a. 06/2014 at time of MI, s/p DCCV.  ? ? ? ?Assessment: ?75yom with Hx HFrEF s/p LCAD  HM2 2015.  He presented today in VT > SR s/p DCCV.   ?INR elevated > 10, LDH elevated 1090, elevated LFTs.  Will ive FFP and vitamin K  '2mg'$  iv and follow up labs  ? ?Goal of Therapy:  ?INR 2-2.5 ?Monitor platelets by anticoagulation protocol: Yes ?  ?Plan:  ?Holding warfarin for now ?Daily CBC, Protime ? ?Bonnita Nasuti Pharm.D. CPP, BCPS ?Clinical Pharmacist ?[phone] ?10/30/2021 1:52 PM  ? ? ? ?

## 2021-10-30 NOTE — ED Provider Notes (Signed)
[facility] EMERGENCY DEPARTMENT Provider Note   CSN: [id number] Arrival date & time: 10/30/21  4098     History  No chief complaint on file.   Scott [name] is a 76 y.o. male.  HPI 76 year old male with a history of an LVAD presents with ventricular tachycardia.  History is mostly from the staff that accompanies him from the cardiology clinic.  Has been feeling poorly for the whole weekend and then was found to be in ventricular tachycardia when he showed up to the clinic.  Here he seems a little confused according to people that know him well and denies dyspnea or chest pain.  Home Medications Prior to Admission medications   Medication Sig Start Date End Date Taking? Authorizing Provider  acetaminophen (TYLENOL) 500 MG tablet Take 1,000 mg by mouth every 6 (six) hours as needed for moderate pain.    Yes [provider]  aspirin EC 81 MG tablet Take 1 tablet (81 mg total) by mouth daily. 11/12/16  Yes Clegg, Amy D, NP  cephALEXin (KEFLEX) 500 MG capsule Take 1 capsule (500 mg total) by mouth 2 (two) times daily. 12/06/20  Yes Bensimhon, Bevelyn Buckles, MD  clobetasol ointment (TEMOVATE) 0.05 % Apply 1 application topically 2 (two) times daily. 03/15/21  Yes Bensimhon, Bevelyn Buckles, MD  clopidogrel (PLAVIX) 75 MG tablet Take 1 tablet (75 mg total) by mouth daily. 09/14/21  Yes Bensimhon, Bevelyn Buckles, MD  cyclobenzaprine (FLEXERIL) 5 MG tablet Take 1 tablet (5 mg total) by mouth 3 (three) times daily as needed for muscle spasms. 06/30/20  Yes Bensimhon, Bevelyn Buckles, MD  furosemide (LASIX) 20 MG tablet Take 1 tablet (20 mg total) by mouth daily. Take 20 mg daily; may take 40 mg if weight gain. HOLD AS OF 10/27/21 10/27/21  Yes Bensimhon, Bevelyn Buckles, MD  pantoprazole (PROTONIX) 40 MG tablet Take 1 tablet (40 mg total) by mouth at bedtime. Patient taking differently: Take 40 mg by mouth daily. 06/14/21  Yes Bensimhon, Bevelyn Buckles, MD  potassium chloride SA (KLOR-CON) 20 MEQ tablet  Every other day alternate 20 mEq (1 tablet) with 40 mEq (2 tablets) Patient taking differently: 20 mEq daily. Every other day alternate 20 mEq (1 tablet) with 40 mEq (2 tablets) 03/15/21  Yes Bensimhon, Bevelyn Buckles, MD  rosuvastatin (CRESTOR) 10 MG tablet Take 10 mg by mouth 3 (three) times a week.   Yes [provider]  traMADol (ULTRAM) 50 MG tablet Take 2 tablets (100 mg total) by mouth every 6 (six) hours as needed for moderate pain. 08/30/20  Yes Bensimhon, Bevelyn Buckles, MD  traZODone (DESYREL) 100 MG tablet Take 2 tablets (200 mg total) by mouth at bedtime. 09/14/21  Yes Bensimhon, Bevelyn Buckles, MD  warfarin (JANTOVEN) 6 MG tablet TAKE 1 TABLET DAILY ,      EXCEPT TAKE 1 AND 1/2      TABLETS (9MG ) ON WEDNESDAY. Patient taking differently: TAKE 1 TABLET DAILY 06/16/21  Yes Bensimhon, Bevelyn Buckles, MD  sildenafil (VIAGRA) 100 MG tablet Take 1 tablet (100 mg total) by mouth daily as needed for erectile dysfunction. Patient not taking: Reported on 10/30/2021 08/30/20   Bensimhon, Bevelyn Buckles, MD      Allergies    Biopatch [chlorhexidine]    Review of Systems   Review of Systems  Unable to perform ROS: Mental status change   Physical Exam Updated Vital Signs BP (!) 108/94   Pulse 79   Resp (!) 39   Wt 90  kg   SpO2 99%   BMI 30.17 kg/m  Physical Exam Vitals and nursing note reviewed.  Constitutional:      Appearance: He is well-developed. He is ill-appearing.  HENT:     Head: Normocephalic and atraumatic.  Cardiovascular:     Comments: LVAD hum Pulmonary:     Effort: Pulmonary effort is normal.     Breath sounds: Normal breath sounds.  Abdominal:     General: There is no distension.  Skin:    General: Skin is warm and dry.  Neurological:     Mental Status: He is alert.    ED Results / Procedures / Treatments   Labs (all labs ordered are listed, but only abnormal results are displayed)   EKG None  Radiology [place] EKG SITE RITE  Result Date: 10/30/2021 If Site Rite image not  attached, placement could not be confirmed due to current cardiac rhythm.   Procedures .Sedation  Date/Time: 10/30/2021 10:03 AM Performed by: Pricilla Loveless, MD Authorized by: Pricilla Loveless, MD   Consent:    Consent obtained:  Emergent situation Universal protocol:    Immediately prior to procedure, a time out was called: yes   Pre-sedation assessment:    Time since last food or drink:  Unknown   NPO status caution: urgency dictates proceeding with non-ideal NPO status     ASA classification: class 4 - patient with severe systemic disease that is a constant threat to life     Mallampati score:  II - soft palate, uvula, fauces visible   Pre-sedation assessments completed and reviewed: airway patency, cardiovascular function, hydration status, mental status, nausea/vomiting, pain level, respiratory function and temperature   Immediate pre-procedure details:    Reviewed: vital signs, relevant labs/tests and NPO status     Verified: bag valve mask available, emergency equipment available, intubation equipment available, IV patency confirmed, oxygen available and suction available   Procedure details (see MAR for exact dosages):    Preoxygenation:  Nonrebreather mask   Sedation:  Etomidate and midazolam   Intended level of sedation: deep   Intra-procedure monitoring:  Blood pressure monitoring, cardiac monitor, continuous pulse oximetry and frequent vital sign checks   Intra-procedure events: respiratory depression     Intra-procedure management:  Airway repositioning and BVM ventilation   Total Provider sedation time (minutes):  15 Post-procedure details:    Attendance: Constant attendance by certified staff until patient recovered   .Critical Care Performed by: Pricilla Loveless, MD Authorized by: Pricilla Loveless, MD   Critical care provider statement:    Critical care time (minutes):  30   Critical care time was exclusive of:  Separately billable procedures and treating other  patients   Critical care was necessary to treat or prevent imminent or life-threatening deterioration of the following conditions:  Circulatory failure and respiratory failure   Critical care was time spent personally by me on the following activities:  Development of treatment plan with patient or surrogate, discussions with consultants, evaluation of patient's response to treatment, examination of patient, ordering and review of laboratory studies, ordering and review of radiographic studies, ordering and performing treatments and interventions, pulse oximetry, re-evaluation of patient's condition and review of old charts    Medications Ordered in ED Medications  midazolam (VERSED) 2 MG/2ML injection (has no administration in time range)  acetaminophen (TYLENOL) tablet 650 mg (has no administration in time range)  ondansetron (ZOFRAN) injection 4 mg (has no administration in time range)  amiodarone (NEXTERONE PREMIX) 360-4.14 MG/200ML-% (1.8 mg/mL)  IV infusion (has no administration in time range)    Followed by  amiodarone (NEXTERONE PREMIX) 360-4.14 MG/200ML-% (1.8 mg/mL) IV infusion (has no administration in time range)  0.9 %  sodium chloride infusion (has no administration in time range)  norepinephrine (LEVOPHED) 4mg  in (0.016 mg/mL) premix infusion (has no administration in time range)  phytonadione (VITAMIN K) 2 mg in dextrose 5 % 50 mL IVPB (has no administration in time range)  0.9 %  sodium chloride infusion (Manually program via Guardrails IV Fluids) (has no administration in time range)  milrinone (PRIMACOR) 20 MG/100 ML (0.2 mg/mL) infusion (0.125 mcg/kg/min  90 kg Intravenous New Bag/Given 10/30/21 1116)  lactated ringers bolus 500 mL (has no administration in time range)  lactated ringers infusion (999 mL/hr Intravenous New Bag/Given 10/30/21 0938)  midazolam (VERSED) injection (1 mg Intravenous Given 10/30/21 0938)  etomidate (AMIDATE) injection (16 mg Intravenous Given  10/30/21 0940)  PHENYLephrine 80 mcg/ml in normal saline Adult IV Push Syringe (For Blood Pressure Support) (320 mcg Intravenous Given 10/30/21 3244)    ED Course/ Medical Decision Making/ A&P                            Patient presents in ventricular tachycardia.  He also appears a little altered according to staff that knows him.  Otherwise he is protecting his airway.  Labs are not available but clearly on the monitor he is in ventricular tachycardia.  Due to urgency, he was sedated and cardioverted with emergent consent.  Unfortunately it appears that the nurse gave 8 mL and not 8 mg of etomidate and so he was sedated for quite some time.  However he was successfully bag-valve-mask ventilated through this and was starting to wake up and breathe on his own on a nonrebreather.  He did convert out of ventricular tachycardia.  Cardiology decided to take him to the ICU for further care.  Is more awake and seems to be protecting his airway but will need close ICU monitoring.         Final Clinical Impression(s) / ED Diagnoses Final diagnoses:  V-tach [facility])    Rx / DC Orders ED Discharge Orders     None         Pricilla Loveless, MD 10/30/21 1120

## 2021-10-30 NOTE — Progress Notes (Signed)
RT at bedside for a conscious sedation procedure.  Patient did require manual ventilation for approx. 10 minutes.  Patient placed on a NRB mask at 100% 15L once the patient's breathing resumed to a normal level. ?

## 2021-10-30 NOTE — Progress Notes (Signed)
?  Remains in NSR.  ? ?More alert now. Off Bipap.  ? ?Renal function improving but K remains high. Will give lokelma.  ? ?INR improved after FFP and vit K ? ?MAPs ok.  ? ?Discussed with him and his wife. Would want intubation if needed. ? ?VAD interrogated personally. Parameters stable. ? ? ?Additional CCT 35 min.  ? ?[name], MD  ?6:22 PM ? ?

## 2021-10-30 NOTE — Plan of Care (Signed)
?  Problem: Education: ?Goal: Knowledge of General Education information will improve ?Description: Including pain rating scale, medication(s)/side effects and non-pharmacologic comfort measures ?Outcome: Progressing ?  ?Problem: Health Behavior/Discharge Planning: ?Goal: Ability to manage health-related needs will improve ?Outcome: Progressing ?  ?Problem: Clinical Measurements: ?Goal: Ability to maintain clinical measurements within normal limits will improve ?Outcome: Progressing ?Goal: Will remain free from infection ?Outcome: Progressing ?Goal: Diagnostic test results will improve ?Outcome: Progressing ?Goal: Respiratory complications will improve ?Outcome: Progressing ?Goal: Cardiovascular complication will be avoided ?Outcome: Progressing ?  ?Problem: Activity: ?Goal: Risk for activity intolerance will decrease ?Outcome: Progressing ?  ?Problem: Nutrition: ?Goal: Adequate nutrition will be maintained ?Outcome: Progressing ?  ?Problem: Coping: ?Goal: Level of anxiety will decrease ?Outcome: Progressing ?  ?Problem: Elimination: ?Goal: Will not experience complications related to bowel motility ?Outcome: Progressing ?Goal: Will not experience complications related to urinary retention ?Outcome: Progressing ?  ?Problem: Pain Managment: ?Goal: General experience of comfort will improve ?Outcome: Progressing ?  ?Problem: Safety: ?Goal: Ability to remain free from injury will improve ?Outcome: Progressing ?  ?Problem: Skin Integrity: ?Goal: Risk for impaired skin integrity will decrease ?Outcome: Progressing ?  ?Problem: Cardiac: ?Goal: LVAD will function as expected and patient will experience no clinical alarms ?Outcome: Progressing ?  ?

## 2021-10-30 NOTE — Procedures (Signed)
RT assisted with patient transport from ED to 2B84 without complications. ?

## 2021-10-30 NOTE — Progress Notes (Signed)
VAD Coordinator Procedure Note:  ? ?Pt undergoing emergent cardioversion in the ED per Dr. Haroldine Laws for VT @ 290. Hemodynamics and VAD parameters monitored by myself, DR Bensimhon and ED team throughout the procedure. Blood pressures were obtained with automatic cuff on left arm. ? ? Time: Doppler Auto  ?BP Flow PI Power Speed  ?Pre-procedure:  0569  79/54 (61) 4.5 2.1 5.1 9200  ?         ?         ?Sedation Induction: 0943  81/50(60) 4.3 2.1 5.1 9200  ?After shocked at 200j 0954  138/97 ?(108) 4.7 4.3 5.3 9200  ?         ?         ?Recovery Area: 1024 94  5.1 5.0 5.5 9200  ?         ? ? ?Patient tolerated the procedure well. VAD Coordinator accompanied and remained with patient in ED.   ? ? ?Patient Disposition: Pt transported to [facility] on batteries with Dr Haroldine Laws and RT and myself. Handoff given to [place], [place]. ? ?[name] RN, BSN ?VAD Coordinator ?24/7 Pager [phone] ? ? ?

## 2021-10-30 NOTE — Progress Notes (Signed)
Peripherally Inserted Central Catheter Placement ? ?The IV Nurse has discussed with the patient and/or persons authorized to consent for the patient, the purpose of this procedure and the potential benefits and risks involved with this procedure.  The benefits include less needle sticks, lab draws from the catheter, and the patient may be discharged home with the catheter. Risks include, but not limited to, infection, bleeding, blood clot (thrombus formation), and puncture of an artery; nerve damage and irregular heartbeat and possibility to perform a PICC exchange if needed/ordered by physician.  Alternatives to this procedure were also discussed.  Bard Power PICC patient education guide, fact sheet on infection prevention and patient information card has been provided to patient /or left at bedside.  PICC placed by Rosalio Macadamia, RN ? ?PICC Placement Documentation  ?PICC Double Lumen 89/37/34 Right Basilic 43 cm 2 cm (Active)  ?Indication for Insertion or Continuance of Line Vasoactive infusions 10/30/21 2136  ?Exposed Catheter (cm) 2 cm 10/30/21 2136  ?Site Assessment Clean;Dry;Intact 10/30/21 2136  ?Lumen #1 Status Flushed;Saline locked;Blood return noted 10/30/21 2136  ?Lumen #2 Status Flushed;Saline locked;Blood return noted 10/30/21 2136  ?Dressing Type Securing device;Transparent 10/30/21 2136  ?Dressing Status Antimicrobial disc in place 10/30/21 2136  ?Safety Lock Not Applicable 28/76/81 1572  ?Line Care Connections checked and tightened 10/30/21 2136  ?Line Adjustment (NICU/IV Team Only) No 10/30/21 2136  ?Dressing Intervention New dressing 10/30/21 2136  ?Dressing Change Due 11/06/21 10/30/21 2136  ? ? ? ? ? ?Scott [name], Nicolette Bang ?10/30/2021, 9:37 PM ? ?

## 2021-10-30 NOTE — Progress Notes (Signed)
Pt arrived to clinic in [facility], pt is confused, jaundice in appearance. Unable to auscultate doppler. Pt placed on the monitor, VT at 290. [place] called. ED charge nurse notified. Will take pt to ED, Dr Haroldine Laws will meet [place] there. Pts visit will be changed to lab only. ? ?[name] RN, BSN ?VAD Coordinator ?24/7 Pager [phone] ? ?

## 2021-10-30 NOTE — Addendum Note (Signed)
Addended by: [name] B on: 10/30/2021 08:50 AM ? ? Modules accepted: Orders ? ?

## 2021-10-31 ENCOUNTER — Inpatient Hospital Stay (HOSPITAL_COMMUNITY): Payer: Medicare Other

## 2021-10-31 DIAGNOSIS — I5022 Chronic systolic (congestive) heart failure: Secondary | ICD-10-CM

## 2021-10-31 LAB — BASIC METABOLIC PANEL
Anion gap: 7 (ref 5–15)
BUN: 81 mg/dL — ABNORMAL HIGH (ref 8–23)
CO2: 27 mmol/L (ref 22–32)
Calcium: 9.5 mg/dL (ref 8.9–10.3)
Chloride: 104 mmol/L (ref 98–111)
Creatinine, Ser: 1.6 mg/dL — ABNORMAL HIGH (ref 0.61–1.24)
GFR, Estimated: 45 mL/min — ABNORMAL LOW (ref >60)
Glucose, Bld: 121 mg/dL — ABNORMAL HIGH (ref 70–99)
Potassium: 4.9 mmol/L (ref 3.5–5.1)
Sodium: 138 mmol/L (ref 135–145)

## 2021-10-31 LAB — PREPARE FRESH FROZEN PLASMA
Unit division: 0
Unit division: 0

## 2021-10-31 LAB — CBC
HCT: 42.9 % (ref 39.0–52.0)
Hemoglobin: 13.4 g/dL (ref 13.0–17.0)
MCH: 29.3 pg (ref 26.0–34.0)
MCHC: 31.2 g/dL (ref 30.0–36.0)
MCV: 93.9 fL (ref 80.0–100.0)
Platelets: 157 10*3/uL (ref 150–400)
RBC: 4.57 MIL/uL (ref 4.22–5.81)
RDW: 19.8 % — ABNORMAL HIGH (ref 11.5–15.5)
WBC: 12.9 10*3/uL — ABNORMAL HIGH (ref 4.0–10.5)
nRBC: 1.9 % — ABNORMAL HIGH (ref 0.0–0.2)

## 2021-10-31 LAB — COOXEMETRY PANEL
Carboxyhemoglobin: 1.8 % — ABNORMAL HIGH (ref 0.5–1.5)
Methemoglobin: <0.7 % (ref 0.0–1.5)
O2 Saturation: 74.6 %
Total hemoglobin: 13.4 g/dL (ref 12.0–16.0)

## 2021-10-31 LAB — ECHOCARDIOGRAM COMPLETE
Area-P 1/2: 6.65 cm2
Height: 68 in
S' Lateral: 6.7 cm
Weight: 3132.3 oz

## 2021-10-31 LAB — BPAM FFP
Blood Product Expiration Date: 202305062359
Blood Product Expiration Date: 202305062359
ISSUE DATE / TIME: 202305011144
ISSUE DATE / TIME: 202305011144
Unit Type and Rh: 6200
Unit Type and Rh: 6200

## 2021-10-31 LAB — LACTATE DEHYDROGENASE: LDH: 719 U/L — ABNORMAL HIGH (ref 98–192)

## 2021-10-31 LAB — PROTIME-INR
INR: 4.8 (ref 0.8–1.2)
Prothrombin Time: 44.3 seconds — ABNORMAL HIGH (ref 11.4–15.2)

## 2021-10-31 LAB — PATHOLOGIST SMEAR REVIEW

## 2021-10-31 LAB — MAGNESIUM: Magnesium: 2.5 mg/dL — ABNORMAL HIGH (ref 1.7–2.4)

## 2021-10-31 MED ORDER — PERFLUTREN LIPID MICROSPHERE
1.0000 mL | INTRAVENOUS | Status: AC | PRN
  Administered 2021-10-31: 6 mL via INTRAVENOUS
  Filled 2021-10-31: qty 10

## 2021-10-31 MED ORDER — SODIUM CHLORIDE 0.9% FLUSH: 10.0000 mL | INTRAVENOUS | Status: DC | PRN

## 2021-10-31 MED ORDER — FUROSEMIDE 10 MG/ML IJ SOLN
40.0000 mg | Freq: Once | INTRAMUSCULAR | Status: AC
  Administered 2021-10-31: 40 mg via INTRAVENOUS
  Filled 2021-10-31: qty 4

## 2021-10-31 MED ORDER — SODIUM CHLORIDE 0.9% FLUSH
10.0000 mL | Freq: Two times a day (BID) | INTRAVENOUS | Status: DC
  Administered 2021-10-31 – 2021-11-02 (×4): 10 mL

## 2021-10-31 MED ORDER — ROSUVASTATIN CALCIUM 5 MG PO TABS: 10.0000 mg | ORAL_TABLET | Freq: Every day | ORAL | Status: DC

## 2021-10-31 NOTE — Progress Notes (Addendum)
ANTICOAGULATION CONSULT NOTE ? ?Pharmacy Consult for warfarin ?Indication:  LVAD ? ?Allergies  ?Allergen Reactions  ? Biopatch [Chlorhexidine] Dermatitis and Rash  ? ? ?Patient Measurements: ?Height: '5\' 8"'$  (172.7 cm) ?Weight: 88.8 kg (195 lb 12.3 oz) ?IBW/kg (Calculated) : 68.4 ? ? ?Vital Signs: ?Temp: 97.7 ?F (36.5 ?C) (05/02 0400) ?Temp Source: Oral (05/02 0400) ?BP: 98/85 (05/02 0300) ?Pulse Rate: 79 (05/02 0700) ? ?Labs: ?Recent Labs  ?  10/30/21 ?0909 10/30/21 ?1051 10/30/21 ?1615 10/30/21 ?2222 10/31/21 ?0600  ?HGB 15.4   < > 13.1 14.3 13.4  ?HCT 50.2   < > 42.5 42.0 42.9  ?PLT 226  --  167  --  157  ?LABPROT >90.0*  --  38.6*  --   --   ?INR >10.0*  --  4.0*  --   --   ?CREATININE 2.69*  --  1.98* 1.70* 1.60*  ? < > = values in this interval not displayed.  ? ? ? ?Estimated Creatinine Clearance: 44.6 mL/min (A) (by C-G formula based on SCr of 1.6 mg/dL (H)). ? ? ?Medical History: ?Past Medical History:  ?Diagnosis Date  ? Acute on chronic respiratory failure ([place])   ? a. 08/2014 in setting of PE.  ? Ankylosing spondylitis ([place])   ? Bilateral pulmonary embolism (H[facility])   ? a. 08/2014 - started on Coumadin. Retrievable IVC filter placed 08/27/14 due to RV strain and large clot burden.  ? CAD (coronary artery disease)   ? a. stenting of LCx 2013; b. STEMI 06/12/14 s/p PCI to LAD complicated by post cath shock requiring IABP; VT s/p DCCV, EF 20%; c. NSTEMI 06/26/14 treated medically.  ? Carotid artery disease ([place])   ? a. s/p stenting.  ? Chronic systolic CHF (congestive heart failure) ([place])   ? Diabetes ([place])   ? Hemoptysis   ? a. 08/2014 possibly due to pulm infarct/PE.  ? Hyperlipidemia   ? Hypertension   ? Hypotension   ? Ischemic cardiomyopathy   ? a. echo 08/23/2014 EF <20%, dilated CM, mod MR/TR  ? Leukocytosis   ? Leukocytosis 09/10/2014  ? Nephrolithiasis   ? Pleural effusion on right 08/2014 - small  ? Reactive thrombocytosis 09/10/2014  ? Right leg DVT ([place])   ? a. 08/2014.  ? Ventricular tachycardia ([place])   ? a.  06/2014 at time of MI, s/p DCCV.  ? ? ? ?Assessment: ?75yom with Hx HFrEF s/p LCAD  HM2 2015.  He presented today in VT > SR s/p DCCV.   ?INR elevated > 10 on admit, LDH elevated 1090, elevated LFTs.  Now post FFP and vitamin K '2mg'$ . INR dipped to 4 yesterday but now up to 4.8 this am. No overt bleeding issues noted.  ? ?Of note patient was also on aspirin and plavix due to concern over stroke prior to admission. Aspirin continued on admission, plavix currently on hold. LDH trended down to 719. CBC stable overnight.  ? ?Goal of Therapy:  ?INR 2-2.5 ?Monitor platelets by anticoagulation protocol: Yes ?  ?Plan:  ?INR elevated - hold warfarin/AC for now ? ?Erin Hearing PharmD., BCPS ?Clinical Pharmacist ?10/31/2021 7:29 AM ? ?

## 2021-10-31 NOTE — Evaluation (Signed)
Physical Therapy Evaluation ?Patient Details ?Name: Scott [name] ?MRN: [id number] ?DOB: 02/11/1946 ?Today's Date: 10/31/2021 ? ?History of Present Illness ? Patient is a 76 y/o male who presents on 5/1 from [facility]; found to be in VT with HR in 270s bpm s/p emergent DCCV. PMH includes LVAD HMII (2016), DVT/PE, HF, CAD, DM, HTN, Ankylosing spondylitis  ?Clinical Impression ? Patient presents with sleepiness, generalized weakness and impaired mobility s/p above. Pt is from home with wife and is independent for ADls/IADLs at baseline. Today, pt reports feeling tired due to not sleeping last night. Agreeable to OOB to chair with min guard assist for support/safety. VSS on 02 throughout activity. Encouraged OOB to chair for all meals and will progress ambulation when pt less fatigued. Anticipating pt will progress well with mobility with increased activity. Will follow acutely to maximize independence and mobility prior to return home. ? ?   ? ?Recommendations for follow up therapy are one component of a multi-disciplinary discharge planning process, led by the attending physician.  Recommendations may be updated based on patient status, additional functional criteria and insurance authorization. ? ?Follow Up Recommendations No PT follow up (pending progress) ? ?  ?Assistance Recommended at Discharge Intermittent Supervision/Assistance  ?Patient can return home with the following ? Help with stairs or ramp for entrance;Assistance with cooking/housework;A little help with walking and/or transfers;A little help with bathing/dressing/bathroom;Assist for transportation ? ?  ?Equipment Recommendations None recommended by PT  ?Recommendations for Other Services ?    ?  ?Functional Status Assessment Patient has had a recent decline in their functional status and demonstrates the ability to make significant improvements in function in a reasonable and predictable amount of time.  ? ?  ?Precautions / Restrictions  Precautions ?Precautions: Other (comment);Fall ?Precaution Comments: LVAD ?Restrictions ?Weight Bearing Restrictions: No  ? ?  ? ?Mobility ? Bed Mobility ?Overal bed mobility: Needs Assistance ?Bed Mobility: Supine to Sit ?  ?  ?Supine to sit: HOB elevated, Min guard ?  ?  ?General bed mobility comments: No assist needed, use of rails. ?  ? ?Transfers ?Overall transfer level: Needs assistance ?Equipment used: None ?Transfers: Sit to/from Stand, Bed to chair/wheelchair/BSC ?Sit to Stand: Min guard ?Stand pivot transfers: Min guard ?  ?  ?  ?  ?General transfer comment: Min guard for safety. Stood from EOB x1, SPT bed to chair with min guard assist to manage lines. ?  ? ?Ambulation/Gait ?  ?  ?  ?  ?  ?  ?  ?General Gait Details: Deferred due to being sleepy. ? ?Stairs ?  ?  ?  ?  ?  ? ?Wheelchair Mobility ?  ? ?Modified Rankin (Stroke Patients Only) ?  ? ?  ? ?Balance Overall balance assessment: Needs assistance ?Sitting-balance support: Feet supported, No upper extremity supported ?Sitting balance-Leahy Scale: Good ?  ?  ?Standing balance support: During functional activity ?Standing balance-Leahy Scale: Fair ?  ?  ?  ?  ?  ?  ?  ?  ?  ?  ?  ?  ?   ? ? ? ?Pertinent Vitals/Pain Pain Assessment ?Pain Assessment: No/denies pain  ? ? ?Home Living Family/patient expects to be discharged to:: Private residence ?Living Arrangements: Spouse/significant other ?Available Help at Discharge: Family;Available 24 hours/day ?Type of Home: House ?Home Access: Stairs to enter ?Entrance Stairs-Rails: None ?Entrance Stairs-Number of Steps: 2 ?  ?Home Layout: Two level;Able to live on main level with bedroom/bathroom ?Home Equipment: Rolling Walker (2 wheels);BSC/3in1 ?   ?  ?  Prior Function Prior Level of Function : Independent/Modified Independent ?  ?  ?  ?  ?  ?  ?Mobility Comments: Drives, mows lawn. ?ADLs Comments: independent ?  ? ? ?Hand Dominance  ? Dominant Hand: Right ? ?  ?Extremity/Trunk Assessment  ? Upper Extremity  Assessment ?Upper Extremity Assessment: Defer to OT evaluation ?  ? ?Lower Extremity Assessment ?Lower Extremity Assessment: Generalized weakness (but functional) ?  ? ?Cervical / Trunk Assessment ?Cervical / Trunk Assessment: Other exceptions ?Cervical / Trunk Exceptions: Hx of Ankylosing spondylitis  ?Communication  ? Communication: No difficulties  ?Cognition Arousal/Alertness: Lethargic ?Behavior During Therapy: [facility] for tasks assessed/performed ?Overall Cognitive Status: Within Functional Limits for tasks assessed ?  ?  ?  ?  ?  ?  ?  ?  ?  ?  ?  ?  ?  ?  ?  ?  ?General Comments: Reports feeling sleepy ?  ?  ? ?  ?General Comments General comments (skin integrity, edema, etc.): Wife present during session. VSS on 02. ? ?  ?Exercises    ? ?Assessment/Plan  ?  ?PT Assessment Patient needs continued PT services  ?PT Problem List Decreased strength;Decreased mobility;Decreased cognition;Decreased activity tolerance ? ?   ?  ?PT Treatment Interventions Therapeutic exercise;Patient/family education;Therapeutic activities;Functional mobility training;Balance training;Stair training;Gait training;DME instruction   ? ?PT Goals (Current goals can be found in the Care Plan section)  ?Acute Rehab PT Goals ?Patient Stated Goal: feel better ?PT Goal Formulation: With patient ?Time For Goal Achievement: 11/14/21 ?Potential to Achieve Goals: Good ? ?  ?Frequency Min 3X/week ?  ? ? ?Co-evaluation   ?  ?  ?  ?  ? ? ?  ?AM-PAC PT "6 Clicks" Mobility  ?Outcome Measure Help needed turning from your back to your side while in a flat bed without using bedrails?: A Little ?Help needed moving from lying on your back to sitting on the side of a flat bed without using bedrails?: A Little ?Help needed moving to and from a bed to a chair (including a wheelchair)?: A Little ?Help needed standing up from a chair using your arms (e.g., wheelchair or bedside chair)?: A Little ?Help needed to walk in hospital room?: A Little ?Help needed  climbing 3-5 steps with a railing? : A Little ?6 Click Score: 18 ? ?  ?End of Session Equipment Utilized During Treatment: Oxygen ?Activity Tolerance: Patient tolerated treatment well;Other (comment) (sleepy) ?Patient left: in chair;with call bell/phone within reach;with chair alarm set;with family/visitor present ?Nurse Communication: Mobility status ?PT Visit Diagnosis: Difficulty in walking, not elsewhere classified (R26.2);Muscle weakness (generalized) (M62.81) ?  ? ?Time: 4259-5638 ?PT Time Calculation (min) (ACUTE ONLY): 22 min ? ? ?Charges:   PT Evaluation ?$PT Eval Moderate Complexity: 1 Mod ?  ?  ?   ? ? ?Marisa Severin, PT, DPT ?Acute Rehabilitation Services ?Secure chat preferred ?Office [phone] ? ? ? ? ?[place] ?10/31/2021, 1:46 PM ? ?

## 2021-10-31 NOTE — Progress Notes (Addendum)
LVAD Coordinator Rounding Note: ? ?Admitted 10/30/21 due to VT 270s requiring DCCV x 1.   ? ?Started feeling bad over the weekend. Complaining of weakness. Presented to [place] clinic with VT. VT 270s.  Moved urgently to ED. Underwent emergent cardioversion x1. Returned to [place] with frequent PVCs. Received 2 FFP and Vitamin K for INR > 10 on admission.  ? ?HM II LVAD implanted on 09/21/14 by Dr [name] under destination therapy criteria. ? ?Pt laying in bed resting upon my arrival. Pt's wife [name] at bedside. Pt states he feels better this morning, but is still very tired. Remains in SR 70s.  ? ?RUA PICC placed last night. CVP 13. Echo completed- RV severely reduced. Continue Milrinone 0.125 mcg/kg/hr. ? ?Drive line dressing change every 3 days. Saline only. Pt's wife to change dressing today.  ? ?Vital signs: ?Temp: 96.2 ?HR: 72 ?Doppler Pressure: 86 ?Automatic BP: not documented ?O2 Sat: 98% on 2L ?Wt: 195.7> lb   ? ? ?LVAD interrogation reveals:  ?Speed: 9200 ?Flow: 5.5 ?Power:  5.8 w ?PI: 4.7  ? ?Alarms: none ?Events:  rare  ? ?Fixed speed: 9200 ?Low speed limit: 8600 ? ? ?Drive Line: Dressing clean, dry, and intact. Anchor correctly applied. Maintained every 3 days by patient's wife at home. Continue every 3 day dressing changes using daily kit, cleansing with saline only. Dressing change per bedside RN or patient's wife [name]. Dressing change due today.  ? ?Labs:  ?LDH trend: 2979>892 ? ?INR trend: 10.0>4.8 ? ?Creatinine trend: 1.98>1.60 ? ?Anticoagulation Plan: ?-INR Goal: 2.0 - 3.0 ?-ASA Dose: 81 mg daily ? ?Blood Products:  ?- 10/30/21 >> 2 FFP ? ?Arrythmias: VT 270s on admission. DCCV x 1. Started on Amiodarone gtt.  ? ?Gtts: ?Amiodarone 30 mg/hr ?Milrinone 0.125 mcg/kg/hr ? ?Plan/Recommendations:  ?1. Page VAD coordinator with any VAD equipment or drive line issues ?2. Drive line dressing changes every 3 days using daily kit, with saline only.  ? ?Emerson Monte RN ?VAD Coordinator  ?Office: [phone]  ?24/7  Pager: [phone]  ? ? ? ?

## 2021-10-31 NOTE — Progress Notes (Signed)
Pt has PRN BIPAP orders, no distress noted. Pt on 2L [place]  ?

## 2021-10-31 NOTE — Progress Notes (Addendum)
?Advanced Heart Failure VAD Team Note ? ?PCP-Cardiologist: None  ? ?Subjective:   ? ?5/1: Admitted w/ VT, s/p emergent DCCV. ? ?Remains on amio gtt 30/hr. No further VT overnight.  ?K 4.9 ?Mg 2.5 ? ?On Milrinone 0.125. Co-ox pending. CVP 16  ? ?Lactic acid clearing, 6.0>>2.9 ? ?LDH 1,263>>719  ? ?AKI improving, SCr 1.98>>1.70>>1.60  ? ?INR trending down post Vit K and FFP, 10>>4.0>>pending. Hgb stable. No bleeding.  ? ?Echo pending.  ? ?More alert today. Denies CP. Feels mildly SOB. No other complaints.  ? ?LVAD INTERROGATION:  ?HeartMate II LVAD:   ?Flow 6.0 liters/min, speed 9200, power 5.9, PI 4.7.  No PI events ? ?Objective:   ? ?Vital Signs:   ?Temp:  [94 ?F (34.4 ?C)-97.7 ?F (36.5 ?C)] 97.7 ?F (36.5 ?C) (05/02 0400) ?Pulse Rate:  [56-290] 75 (05/02 0600) ?Resp:  [9-42] 17 (05/02 0600) ?BP: (53-114)/(28-102) 98/85 (05/02 0300) ?SpO2:  [83 %-100 %] 95 % (05/02 0600) ?FiO2 (%):  [50 %] 50 % (05/01 1440) ?Weight:  [88.8 kg-90 kg] 88.8 kg (05/02 0600) ?Last BM Date :  (PTA) ?Mean arterial Pressure 80s-90s ? ?Intake/Output:  ? ?Intake/Output Summary (Last 24 hours) at 10/31/2021 0713 ?Last data filed at 10/31/2021 0600 ?Gross per 24 hour  ?Intake 2260.67 ml  ?Output 1410 ml  ?Net 850.67 ml  ?  ? ?Physical Exam  ?  ?CVP 16  ?General:  Well appearing, sitting up in bed. No resp difficulty ?HEENT: normal ?Neck: supple. JVP elevated to jaw . Carotids 2+ bilat; no bruits. No lymphadenopathy or thyromegaly appreciated. ?Cor: Mechanical heart sounds with LVAD hum present. ?Lungs: clear ?Abdomen: soft, nontender, nondistended. No hepatosplenomegaly. No bruits or masses. Good bowel sounds. ?Driveline: C/D/I; securement device intact and driveline incorporated ?Extremities: no cyanosis, clubbing, rash, edema, trace b/l LEE, + RUE PICC  ?Neuro: alert & orientedx3, cranial nerves grossly intact. moves all 4 extremities w/o difficulty. Affect pleasant ? ? ?Telemetry  ? ?NSR 70s. No further VT  ? ?EKG  ?  ?N/a ? ?Labs  ? ?Basic  Metabolic Panel: ?Recent Labs  ?Lab 10/30/21 ?0909 10/30/21 ?1051 10/30/21 ?1615 10/30/21 ?2222 10/31/21 ?0600  ?NA 135 134* 138 138 138  ?K 5.4* 5.5* 5.8* 5.0 4.9  ?CL 99  --  105 101 104  ?CO2 20*  --  23  --  27  ?GLUCOSE 122*  --  127* 103* 121*  ?BUN 107*  --  100* 97* 81*  ?CREATININE 2.69*  --  1.98* 1.70* 1.60*  ?CALCIUM 10.0  --  9.4  --  9.5  ?MG 2.8*  --   --   --  2.5*  ? ? ?Liver Function Tests: ?Recent Labs  ?Lab 10/30/21 ?0909  ?AST 1,106*  ?ALT 1,170*  ?ALKPHOS 142*  ?BILITOT 4.6*  ?PROT 7.9  ?ALBUMIN 4.2  ? ?No results for input(s): LIPASE, AMYLASE in the last 168 hours. ?No results for input(s): AMMONIA in the last 168 hours. ? ?CBC: ?Recent Labs  ?Lab 10/30/21 ?0909 10/30/21 ?1051 10/30/21 ?1615 10/30/21 ?2222 10/31/21 ?0600  ?WBC 15.3*  --  15.5*  --  12.9*  ?HGB 15.4 14.6 13.1 14.3 13.4  ?HCT 50.2 43.0 42.5 42.0 42.9  ?MCV 95.8  --  93.6  --  93.9  ?PLT 226  --  167  --  157  ? ? ?INR: ?Recent Labs  ?Lab 10/30/21 ?0909 10/30/21 ?1615  ?INR >10.0* 4.0*  ? ? ?Other results: ?EKG:  ? ? ?Imaging  ? ?[place] EKG  SITE RITE ? ?Result Date: 10/30/2021 ?If [employer] not attached, placement could not be confirmed due to current cardiac rhythm.  ? ? ?Medications:   ? ? ?Scheduled Medications: ? aspirin EC  81 mg Oral Daily  ? cephALEXin  500 mg Oral Q12H  ? mouth rinse  15 mL Mouth Rinse BID  ? pantoprazole  40 mg Oral Daily  ? sodium chloride flush  10-40 mL Intracatheter Q12H  ? sodium zirconium cyclosilicate  10 g Oral Once  ? ? ?Infusions: ? sodium chloride Stopped (10/30/21 1447)  ? amiodarone Stopped (10/31/21 0556)  ? milrinone Stopped (10/31/21 0556)  ? norepinephrine (LEVOPHED) Adult infusion Stopped (10/30/21 1146)  ? ? ?PRN Medications: ?acetaminophen, cyclobenzaprine, ondansetron (ZOFRAN) IV, sodium chloride flush, traMADol ? ? ?Patient Profile  ? ?76 y/o male with h/o CAD with ankylosing spondylitis, DM2, carotid stenting, severe ischemic CM s/p anterior STEM with VT arrest in 12/15, large PE  with pulmonary infarct in 2/15 s/p IVC filter and sHMII LVAD. ? ?Presented to clinic with weakness and AMS for several days. Found to be in VT at 290. BP low. PI low on VAD. Moved emergently to ER, Then underwent emergent DC-CV. ? ?Assessment/Plan:   ? ?1. VT ?-VT 290s-->Underwent emergent cardioversion x1.  ?-No further VT on tele ?-Continue Amio gtt to complete load, then PO 400 mg bid  ?-Keep K > 4.0 and Mg > 2.0  ?  ?2.  Chronic systolic HF: Ischemic cardiomyopathy, EF 20% s/p HMII LVAD 09/20/13.   ?-Limited mostly by arthritis Now 8 years out ?-Admitted w/ shock/ lactic acidosis in setting of sustained VT ?-On milrinone 0.125. Co-ox pending. LA clearing, 6.0>2.9. Continue milrinone @ current rate ?-CVP 16, Gentle diuresis IV Lasix 40 mg x 1 ?-Check Echo  ?  ?2. CAD s/p Anterior STEMI on 06/12/14 with stenting of LAD:  ?- No recent CP  ?- Off ASA.  ?- Previously stopped statin but now back on crestor 3x/week. No change ?  ?3. DL infection, chronic ?-Having small amount of bloody exudate ?-His wife is changing the dressing every 3 days.  ?-on chronic Keflex  ?  ?4 VAD management:  ?- Had admit in 08/2017 for elevated LDH.  ?- LDH stabilized 300-380 range. Concern has been for chronic pump clot.  ?- LDH this admit 1,263>>719 ?- Off asa. On coumadin. INR >10 on admit>>VitK and FFT>>4.0>>pending ?- Will try to keep INR 2.3- 2.8. Discussed dosing with PharmD personally.  ?- Continue Keflex as above for DL ?- VAD interrogated personally. Parameters stable. ?  ?5. Ankylosing spondylitis: Off NSAIDS. ?-  No longer following with rheumatology. Uses tramadol. Avoids NSAIDs as much as possible. ?-  No change ?   ?6.. Kidney Stones  ?- CT with renal calculi bilaterally. Had some recent hematuria  Now followed by Dr Tresa Moore at [facility]. ?- Has f/u soon  ?  ?7. Depression ?- situational.  ?- has refused SSRI ?- stable ? ?8. AKI ?- BL SCr ~1.1 ?- 1.9 on admit, in setting of VT/hypotension  ?- improving, SCr down to 1.60  today  ?- continue to monitor  ? ? ? ?I reviewed the LVAD parameters from today, and compared the results to the patient's prior recorded data.  No programming changes were made.  The LVAD is functioning within specified parameters.  The patient performs LVAD self-test daily.  LVAD interrogation was negative for any significant power changes, alarms or PI events/speed drops.  LVAD equipment check completed and  is in good working order.  Back-up equipment present.   LVAD education done on emergency procedures and precautions and reviewed exit site care. ? ?Length of Stay: 1 ? ?Lyda Jester, PA-C ?10/31/2021, 7:13 AM ? ?VAD Team --- VAD ISSUES ONLY--- ?Pager [phone] (7am - 7am) ? ?Advanced Heart Failure Team  ?Pager [phone] (M-F; 7a - 5p)  ?Please contact [facility] for night-coverage after hours (5p -7a ) and weekends on [email or web address] ? ?Agree with above.  ? ?Remains very fatigued but communicative. On amio and milrinone. No further VT. Co-ox improved. Lactic acid clearing. CVP 15. Renal function improving slowly. INR 4.8 ? ?General:  Weak appearing  ?HEENT: normal  ?Neck: supple. JVP to jaw  Carotids 2+ bilat; no bruits. No lymphadenopathy or thryomegaly appreciated. ?Cor: LVAD hum.  ?Lungs: Clear. ?Abdomen: obese soft, nontender, non-distended. No hepatosplenomegaly. No bruits or masses. Good bowel sounds. Driveline site clean. Anchor in place.  ?Extremities: no cyanosis, clubbing, rash. Warm tr edema  ?Neuro: alert & oriented x 3. No focal deficits. Moves all 4 without problem  ? ?Shock resolving. Will continue milrinone support.  Echo today with LVEF < 20% RV severely HK. Septum midline. CVP 15. Will diurese gently. Hold warfarin. Continue amio.  ? ?PT/OT to see. Keep in ICU  ? ?VAD interrogated personally. Parameters stable. ? ?CRITICAL CARE ?Performed by: [name] ? ?Total critical care time: 35 minutes ? ?Critical care time was exclusive of separately billable procedures and treating other  patients. ? ?Critical care was necessary to treat or prevent imminent or life-threatening deterioration. ? ?Critical care was time spent personally by me (independent of midlevel providers or residents) on the f

## 2021-10-31 NOTE — TOC CM/SW Note (Signed)
.. ?  Transition of Care (TOC) Screening Note ? ? ?Patient Details  ?Name: Scott [name] ?Date of Birth: 1945/10/05 ? ? ?Transition of Care [facility]) CM/SW Contact:    ?Marcheta Grammes Rexene Alberts, RN ?Phone Number: ?10/31/2021, 5:09 PM ? ? ? ?Transition of Care Department [facility]) has reviewed patient and will continue to follow for dc needs. We will continue to monitor patient advancement through interdisciplinary progression rounds. Waiting PT/OT recommendations. Jonnie Finner RN3 CCM, Heart Failure TOC CM [phone]  ?  ?

## 2021-11-01 LAB — CBC
HCT: 42 % (ref 39.0–52.0)
Hemoglobin: 13 g/dL (ref 13.0–17.0)
MCH: 29.3 pg (ref 26.0–34.0)
MCHC: 31 g/dL (ref 30.0–36.0)
MCV: 94.8 fL (ref 80.0–100.0)
Platelets: 161 10*3/uL (ref 150–400)
RBC: 4.43 MIL/uL (ref 4.22–5.81)
RDW: 19.5 % — ABNORMAL HIGH (ref 11.5–15.5)
WBC: 10.4 10*3/uL (ref 4.0–10.5)
nRBC: 1.3 % — ABNORMAL HIGH (ref 0.0–0.2)

## 2021-11-01 LAB — BASIC METABOLIC PANEL
Anion gap: 7 (ref 5–15)
BUN: 48 mg/dL — ABNORMAL HIGH (ref 8–23)
CO2: 33 mmol/L — ABNORMAL HIGH (ref 22–32)
Calcium: 9 mg/dL (ref 8.9–10.3)
Chloride: 101 mmol/L (ref 98–111)
Creatinine, Ser: 1.16 mg/dL (ref 0.61–1.24)
GFR, Estimated: >60 mL/min (ref >60)
Glucose, Bld: 111 mg/dL — ABNORMAL HIGH (ref 70–99)
Potassium: 4.5 mmol/L (ref 3.5–5.1)
Sodium: 141 mmol/L (ref 135–145)

## 2021-11-01 LAB — COOXEMETRY PANEL
Carboxyhemoglobin: 2.3 % — ABNORMAL HIGH (ref 0.5–1.5)
Methemoglobin: <0.7 % (ref 0.0–1.5)
O2 Saturation: 71 %
Total hemoglobin: 13.4 g/dL (ref 12.0–16.0)

## 2021-11-01 LAB — LACTATE DEHYDROGENASE: LDH: 565 U/L — ABNORMAL HIGH (ref 98–192)

## 2021-11-01 LAB — HEMOGLOBIN A1C
Hgb A1c MFr Bld: 6.1 % — ABNORMAL HIGH (ref 4.8–5.6)
Mean Plasma Glucose: 128.37 mg/dL

## 2021-11-01 LAB — MAGNESIUM: Magnesium: 2.2 mg/dL (ref 1.7–2.4)

## 2021-11-01 LAB — PROTIME-INR
INR: 4.1 (ref 0.8–1.2)
Prothrombin Time: 39.6 seconds — ABNORMAL HIGH (ref 11.4–15.2)

## 2021-11-01 MED ORDER — FUROSEMIDE 10 MG/ML IJ SOLN: 40.0000 mg | Freq: Once | INTRAMUSCULAR | Status: DC

## 2021-11-01 MED ORDER — FUROSEMIDE 10 MG/ML IJ SOLN: 40.0000 mg | Freq: Every day | INTRAMUSCULAR | Status: DC

## 2021-11-01 MED ORDER — AMIODARONE HCL 200 MG PO TABS
400.0000 mg | ORAL_TABLET | Freq: Two times a day (BID) | ORAL | Status: DC
Start: 2021-11-01 — End: 2021-11-03
  Administered 2021-11-01 – 2021-11-03 (×5): 400 mg via ORAL
  Filled 2021-11-01 (×5): qty 2

## 2021-11-01 MED ORDER — FUROSEMIDE 10 MG/ML IJ SOLN
40.0000 mg | Freq: Once | INTRAMUSCULAR | Status: AC
  Administered 2021-11-01: 40 mg via INTRAVENOUS
  Filled 2021-11-01: qty 4

## 2021-11-01 MED ORDER — BOOST / RESOURCE BREEZE PO LIQD CUSTOM
1.0000 | Freq: Two times a day (BID) | ORAL | Status: AC
  Administered 2021-11-01 (×2): 1 via ORAL

## 2021-11-01 NOTE — Plan of Care (Signed)
?  Problem: Education: ?Goal: Knowledge of General Education information will improve ?Description: Including pain rating scale, medication(s)/side effects and non-pharmacologic comfort measures ?Outcome: Progressing ?  ?Problem: Health Behavior/Discharge Planning: ?Goal: Ability to manage health-related needs will improve ?Outcome: Progressing ?  ?Problem: Clinical Measurements: ?Goal: Ability to maintain clinical measurements within normal limits will improve ?Outcome: Progressing ?Goal: Will remain free from infection ?Outcome: Progressing ?Goal: Diagnostic test results will improve ?Outcome: Progressing ?Goal: Respiratory complications will improve ?Outcome: Progressing ?Goal: Cardiovascular complication will be avoided ?Outcome: Progressing ?  ?Problem: Activity: ?Goal: Risk for activity intolerance will decrease ?Outcome: Progressing ?  ?Problem: Nutrition: ?Goal: Adequate nutrition will be maintained ?Outcome: Progressing ?  ?Problem: Coping: ?Goal: Level of anxiety will decrease ?Outcome: Progressing ?  ?Problem: Elimination: ?Goal: Will not experience complications related to bowel motility ?Outcome: Progressing ?Goal: Will not experience complications related to urinary retention ?Outcome: Progressing ?  ?Problem: Pain Managment: ?Goal: General experience of comfort will improve ?Outcome: Progressing ?  ?Problem: Safety: ?Goal: Ability to remain free from injury will improve ?Outcome: Progressing ?  ?Problem: Skin Integrity: ?Goal: Risk for impaired skin integrity will decrease ?Outcome: Progressing ?  ?Problem: Cardiac: ?Goal: LVAD will function as expected and patient will experience no clinical alarms ?Outcome: Progressing ?  ?

## 2021-11-01 NOTE — Progress Notes (Addendum)
?Advanced Heart Failure VAD Team Note ? ?PCP-Cardiologist: None  ? ?Subjective:   ? ?5/1: Admitted w/ VT w/ shock, s/p emergent DCCV. ? ?Remains on amio gtt 30/hr. Several brief runs of NSVT on tele, longest 6 beats. ?K 4.5 ?Mg 2.2 ? ?Remains on Milrinone 0.125. Co-ox pending.  ? ?LDH 1,263>>719>>565  ? ?3.3L in UOP w/ IV Lasix yesterday. CVP 13. No resting dyspnea.  ? ?AKI resolved. SCr 1.9 on admit. Down to 1.16 today.  ? ?INR pending.  ? ?Echo LVEF < 20% RV severely HK. Septum midline.  ? ?Feeling better. Less fatigued appearing. Sitting up eating breakfast. No complaints currently  ?  ? ?LVAD INTERROGATION:  ?HeartMate II LVAD:   ?Flow 5.7 liters/min, speed 9200, power 5.8, PI 4.4.  No PI events ? ?Objective:   ? ?Vital Signs:   ?Temp:  [96.2 ?F (35.7 ?C)-100 ?F (37.8 ?C)] 100 ?F (37.8 ?C) (05/02 1901) ?Pulse Rate:  [63-99] 70 (05/03 0700) ?Resp:  [13-30] 21 (05/03 0700) ?SpO2:  [64 %-100 %] 92 % (05/03 0700) ?Weight:  [88.5 kg] 88.5 kg (05/03 0500) ?Last BM Date : 10/31/21 ?Mean arterial Pressure 80s-90s ? ?Intake/Output:  ? ?Intake/Output Summary (Last 24 hours) at 11/01/2021 0713 ?Last data filed at 11/01/2021 0700 ?Gross per 24 hour  ?Intake 697.43 ml  ?Output 3325 ml  ?Net -2627.57 ml  ?  ? ?Physical Exam  ? ?CVP 13 ?General:  well appearing, NAD.  ?HEENT: normal ?Neck: supple. JVD 13 cm . Carotids 2+ bilat; no bruits. No lymphadenopathy or thyromegaly appreciated. ?Cor: Mechanical heart sounds with LVAD hum present. ?Lungs: clear, no wheezing  ?Abdomen: soft, nontender, nondistended. No hepatosplenomegaly. No bruits or masses. Good bowel sounds. ?Driveline: C/D/I; securement device intact and driveline incorporated ?Extremities: no cyanosis, clubbing, rash, edema, trace b/l LEE, + RUE PICC  ?Neuro: alert & orientedx3, cranial nerves grossly intact. moves all 4 extremities w/o difficulty. Affect pleasant ? ? ?Telemetry  ? ?NSR 70s. Several brief runs NSVT 3-6 beats  ? ?EKG  ?  ?N/a ? ?Labs  ? ?Basic Metabolic  Panel: ?Recent Labs  ?Lab 10/30/21 ?0909 10/30/21 ?1051 10/30/21 ?1615 10/30/21 ?2222 10/31/21 ?0600 11/01/21 ?0430  ?NA 135 134* 138 138 138 141  ?K 5.4* 5.5* 5.8* 5.0 4.9 4.5  ?CL 99  --  105 101 104 101  ?CO2 20*  --  23  --  27 33*  ?GLUCOSE 122*  --  127* 103* 121* 111*  ?BUN 107*  --  100* 97* 81* 48*  ?CREATININE 2.69*  --  1.98* 1.70* 1.60* 1.16  ?CALCIUM 10.0  --  9.4  --  9.5 9.0  ?MG 2.8*  --   --   --  2.5* 2.2  ? ? ?Liver Function Tests: ?Recent Labs  ?Lab 10/30/21 ?0909  ?AST 1,106*  ?ALT 1,170*  ?ALKPHOS 142*  ?BILITOT 4.6*  ?PROT 7.9  ?ALBUMIN 4.2  ? ?No results for input(s): LIPASE, AMYLASE in the last 168 hours. ?No results for input(s): AMMONIA in the last 168 hours. ? ?CBC: ?Recent Labs  ?Lab 10/30/21 ?0909 10/30/21 ?1051 10/30/21 ?1615 10/30/21 ?2222 10/31/21 ?0600 11/01/21 ?0430  ?WBC 15.3*  --  15.5*  --  12.9* 10.4  ?HGB 15.4 14.6 13.1 14.3 13.4 13.0  ?HCT 50.2 43.0 42.5 42.0 42.9 42.0  ?MCV 95.8  --  93.6  --  93.9 94.8  ?PLT 226  --  167  --  157 161  ? ? ?INR: ?Recent Labs  ?Lab 10/30/21 ?6301  10/30/21 ?1615 10/31/21 ?0600  ?INR >10.0* 4.0* 4.8*  ? ? ?Other results: ?EKG:  ? ? ?Imaging  ? ?ECHOCARDIOGRAM COMPLETE ? ?Result Date: 10/31/2021 ?   ECHOCARDIOGRAM REPORT   Patient Name:   Scott [name] Date of Exam: 10/31/2021 Medical Rec #:  [id number]           Height:       68.0 in Accession #:    [id number]          Weight:       195.8 lb Date of Birth:  11/22/1945           BSA:          2.025 m? Patient Age:    76 years            BP:           00/00 mmHg Patient Gender: M                   HR:           73 bpm. Exam Location:  Inpatient Procedure: 2D Echo, Cardiac Doppler, Color Doppler and Intracardiac            Opacification Agent Indications:    CHF, Tachycardia  History:        Patient has prior history of Echocardiogram examinations, most                 recent 07/20/2016. CHF, CAD; Arrythmias:Tachycardia.  Sonographer:    Jefferey Pica Referring Phys: [place]  1. Left ventricular ejection fraction, by estimation, is <20%. The left ventricle has severely decreased function. The left ventricle demonstrates global hypokinesis. The left ventricular internal cavity size was severely dilated. Left ventricular diastolic parameters are indeterminate.  2. S/p HMII LVAD. Inflow cannula now well visualized  3. Right ventricular systolic function is severely reduced. The right ventricular size is moderately enlarged.  4. Left atrial size was moderately dilated.  5. Right atrial size was severely dilated.  6. The mitral valve is normal in structure. Mild mitral valve regurgitation.  7. The aortic valve was not well visualized. Aortic valve regurgitation is mild to moderate. No AV opening seen  8. Aortic dilatation noted. There is dilatation of the ascending aorta, measuring 43 mm. FINDINGS  Left Ventricle: Left ventricular ejection fraction, by estimation, is <20%. The left ventricle has severely decreased function. The left ventricle demonstrates global hypokinesis. The left ventricular internal cavity size was severely dilated. There is no left ventricular hypertrophy. Left ventricular diastolic parameters are indeterminate. Right Ventricle: The right ventricular size is moderately enlarged. Right vetricular wall thickness was not well visualized. Right ventricular systolic function is severely reduced. Left Atrium: Left atrial size was moderately dilated. Right Atrium: Right atrial size was severely dilated. Pericardium: The pericardium was not well visualized. Mitral Valve: The mitral valve is normal in structure. Mild mitral valve regurgitation. Tricuspid Valve: The tricuspid valve is normal in structure. Tricuspid valve regurgitation is trivial. Aortic Valve: The aortic valve was not well visualized. Aortic valve regurgitation is mild to moderate. Pulmonic Valve: The pulmonic valve was not well visualized. Pulmonic valve regurgitation is trivial. Aorta: The aortic  root is normal in size and structure and aortic dilatation noted. There is dilatation of the ascending aorta, measuring 43 mm. IAS/Shunts: The interatrial septum was not well visualized.  LEFT VENTRICLE PLAX 2D LVIDd:         7.35 cm LVIDs:  6.70 cm LV PW:         1.10 cm LV IVS:        1.05 cm LVOT diam:     2.10 cm LVOT Area:     3.46 cm?  LEFT ATRIUM             Index        RIGHT ATRIUM           Index LA diam:        5.20 cm 2.57 cm/m?   RA Area:     28.40 cm? LA Vol (A2C):   99.3 ml 49.03 ml/m?  RA Volume:   96.20 ml  47.50 ml/m? LA Vol (A4C):   77.6 ml 38.32 ml/m? LA Biplane Vol: 89.2 ml 44.04 ml/m?                        PULMONIC VALVE AORTA                 PV Vmax:       0.55 m/s Ao Root diam: 3.95 cm PV Vmean:      29.600 cm/s Ao Asc diam:  4.30 cm PV VTI:        0.078 m                       PV Peak grad:  1.2 mmHg                       PV Mean grad:  0.0 mmHg  MITRAL VALVE               TRICUSPID VALVE MV Area (PHT): 6.65 cm?    TR Peak grad:   15.1 mmHg MV Decel Time: 114 msec    TR Vmax:        194.00 cm/s MV E velocity: 58.70 cm/s                            SHUNTS                            Systemic Diam: 2.10 cm Oswaldo Milian MD Electronically signed by Oswaldo Milian MD Signature Date/Time: 10/31/2021/11:07:09 AM    Final   ? ?[place] EKG SITE RITE ? ?Result Date: 10/30/2021 ?If [employer] not attached, placement could not be confirmed due to current cardiac rhythm.  ? ? ?Medications:   ? ? ?Scheduled Medications: ? aspirin EC  81 mg Oral Daily  ? cephALEXin  500 mg Oral Q12H  ? mouth rinse  15 mL Mouth Rinse BID  ? pantoprazole  40 mg Oral Daily  ? sodium chloride flush  10-40 mL Intracatheter Q12H  ? sodium chloride flush  10-40 mL Intracatheter Q12H  ? ? ?Infusions: ? sodium chloride Stopped (10/30/21 1447)  ? amiodarone 30 mg/hr (11/01/21 0701)  ? milrinone 0.125 mcg/kg/min (11/01/21 0701)  ? norepinephrine (LEVOPHED) Adult infusion Stopped (10/30/21 1146)  ? ? ?PRN  Medications: ?acetaminophen, cyclobenzaprine, ondansetron (ZOFRAN) IV, sodium chloride flush, sodium chloride flush, traMADol ? ? ?Patient Profile  ? ?76 y/o male with h/o CAD with ankylosing spondylitis, DM2, caroti

## 2021-11-01 NOTE — Progress Notes (Signed)
LVAD Coordinator Rounding Note: ? ?Admitted 10/30/21 due to VT 270s requiring DCCV x 1.   ? ?Started feeling bad over the weekend. Complaining of weakness. Presented to [facility] with VT. VT 270s.  Moved urgently to ED. Underwent emergent cardioversion x1. Returned to [facility] with frequent PVCs. Received 2 FFP and Vitamin K for INR > 10 on admission.  ? ?HM II LVAD implanted on 09/21/14 by Dr [name] under destination therapy criteria. ? ?Pt sitting up in bed asleep upon my arrival.  Remains in SR 70s.  ? ?Echo completed- RV severely reduced. Milrinone 0.125 mcg/kg/hr. ? ?Drive line dressing change every 3 days. Saline only. Pt's wife to change dressing.  ? ?Vital signs: ?Temp: 100 ?HR: 70 ?Doppler Pressure: 88 ?Automatic BP: not documented ?O2 Sat: 92% on RA ?Wt: 195.7>195.1 lb   ? ? ?LVAD interrogation reveals:  ?Speed: 9200 ?Flow: 5.8 ?Power:  5.9w ?PI: 4.4  ? ?Alarms: none ?Events:  3 yesterday; none today ? ?Fixed speed: 9200 ?Low speed limit: 8600 ? ? ?Drive Line: Dressing clean, dry, and intact. Anchor correctly applied. Maintained every 3 days by patient's wife at home. Continue every 3 day dressing changes using daily kit, cleansing with saline only. Dressing change per bedside RN or patient's wife Scott [name]. Dressing change due 11/04/21.  ? ?Labs:  ?LDH trend: [phone] ? ?INR trend: 10.0>4.8>4.1 ? ?Creatinine trend: 1.98>1.60>1.16 ? ?Anticoagulation Plan: ?-INR Goal: 2.0 - 3.0 ?-ASA Dose: 81 mg daily ? ?Blood Products:  ?- 10/30/21 >> 2 FFP ? ?Arrythmias: VT 270s on admission. DCCV x 1. Started on Amiodarone gtt.  ? ?Gtts: ?Amiodarone 30 mg/hr-off transitioned to PO 11/01/21 400 bid ?Milrinone 0.125 mcg/kg/hr ? ?Plan/Recommendations:  ?1. [place] VAD coordinator with any VAD equipment or drive line issues ?2. Drive line dressing changes every 3 days using daily kit, with saline only.  ? ?[name] RN ?VAD Coordinator  ?Office: [phone]  ?24/7 Pager: [phone]  ? ? ? ?

## 2021-11-01 NOTE — Evaluation (Signed)
Occupational Therapy Evaluation ?Patient Details ?Name: Scott [name] ?MRN: [id number] ?DOB: 12-14-1945 ?Today's Date: 11/01/2021 ? ? ?History of Present Illness Patient is a 76 y/o male who presents on 5/1 from [facility]; found to be in VT with HR in 270s bpm s/p emergent DCCV. PMH includes LVAD HMII (2016), DVT/PE, HF, CAD, DM, HTN, Ankylosing spondylitis  ? ?Clinical Impression ?  ?Patient admitted for the diagnosis above.  Up until 2 weeks prior to admission, the patient was active, and needed little to no assist with ADL/IADL, or mobility.  He lives with his spouse who is able to assist as needed.  Primary deficit is fatigue, and the remaining deficits are listed below.  Currently he is needing up to [place] for ADL completion from a sit/stand level, and Min Guard for mobility.  OT will continue efforts in the acute setting, but no post acute OT is anticipated.  Possibly [place] or outpatient rehab could be needed, depending on progress.    ?   ? ?Recommendations for follow up therapy are one component of a multi-disciplinary discharge planning process, led by the attending physician.  Recommendations may be updated based on patient status, additional functional criteria and insurance authorization.  ? ?Follow Up Recommendations ? No OT follow up  ?  ?Assistance Recommended at Discharge Intermittent Supervision/Assistance  ?Patient can return home with the following Assistance with cooking/housework;Direct supervision/assist for medications management;Assist for transportation ? ?  ?Functional Status Assessment ? Patient has had a recent decline in their functional status and demonstrates the ability to make significant improvements in function in a reasonable and predictable amount of time.  ?Equipment Recommendations ? None recommended by OT  ?  ?Recommendations for Other Services   ? ? ?  ?Precautions / Restrictions Precautions ?Precautions: Fall ?Precaution Comments: LVAD for 7 years ?Restrictions ?Weight  Bearing Restrictions: No  ? ?  ? ?Mobility Bed Mobility ?Overal bed mobility: Needs Assistance ?  ?  ?  ?  ?  ?  ?General bed mobility comments: up in recliner ?Patient Response: Cooperative ? ?Transfers ?Overall transfer level: Needs assistance ?  ?Transfers: Sit to/from Stand ?Sit to Stand: Supervision ?  ?  ?  ?  ?  ?  ?  ? ?  ?Balance Overall balance assessment: Needs assistance ?Sitting-balance support: Feet supported ?Sitting balance-Leahy Scale: Good ?  ?  ?Standing balance support: No upper extremity supported ?Standing balance-Leahy Scale: Fair ?  ?  ?  ?  ?  ?  ?  ?  ?  ?  ?  ?  ?   ? ?ADL either performed or assessed with clinical judgement  ? ?ADL Overall ADL's : Needs assistance/impaired ?  ?  ?  ?  ?Upper Body Bathing: Set up;Sitting ?  ?  ?  ?Upper Body Dressing : Minimal assistance;Sitting ?  ?Lower Body Dressing: Minimal assistance;Sit to/from stand ?  ?Toilet Transfer: Min guard;Rolling walker (2 wheels) ?  ?  ?  ?  ?  ?  ?   ? ? ? ?Vision Ability to See in Adequate Light: 1 Impaired ?Patient Visual Report: No change from baseline ?Additional Comments: reports basline difficulties  ?   ?Perception Perception ?Perception: Within Functional Limits ?  ?Praxis Praxis ?Praxis: Intact ?  ? ?Pertinent Vitals/Pain Pain Assessment ?Pain Assessment: No/denies pain  ? ? ? ?Hand Dominance Right ?  ?Extremity/Trunk Assessment Upper Extremity Assessment ?Upper Extremity Assessment: Overall WFL for tasks assessed ?  ?Lower Extremity Assessment ?Lower Extremity Assessment:  Defer to PT evaluation ?  ?  ?  ?Communication Communication ?Communication: No difficulties ?  ?Cognition Arousal/Alertness: Awake/alert ?Behavior During Therapy: Flat affect ?Overall Cognitive Status: Within Functional Limits for tasks assessed ?  ?  ?  ?  ?  ?  ?  ?  ?  ?  ?  ?  ?  ?  ?  ?  ?General Comments: Remains tired, slow to respond, but appropriate. ?  ?  ?General Comments   VSS on O2 ? ?  ?Exercises   ?  ?Shoulder Instructions     ? ? ?Home Living Family/patient expects to be discharged to:: Private residence ?Living Arrangements: Spouse/significant other ?Available Help at Discharge: Family;Available 24 hours/day ?Type of Home: House ?Home Access: Stairs to enter ?Entrance Stairs-Number of Steps: 2 ?Entrance Stairs-Rails: None ?Home Layout: Two level;Able to live on main level with bedroom/bathroom ?  ?  ?Bathroom Shower/Tub: Tub/shower unit ?  ?Bathroom Toilet: Standard ?  ?  ?Home Equipment: Rolling Walker (2 wheels);BSC/3in1 ?  ?  ?  ? ?  ?Prior Functioning/Environment Prior Level of Function : Independent/Modified Independent ?  ?  ?  ?  ?  ?  ?Mobility Comments: Drives, mows lawn. ?ADLs Comments: Patient typically perfroms sink baths, spouse assists with washing his hair, but he is able to complete the bulk without assist. ?  ? ?  ?  ?OT Problem List: Impaired balance (sitting and/or standing);Decreased activity tolerance ?  ?   ?OT Treatment/Interventions: Self-care/ADL training;Balance training;Therapeutic activities;Energy conservation  ?  ?OT Goals(Current goals can be found in the care plan section) Acute Rehab OT Goals ?Patient Stated Goal: Return home ?OT Goal Formulation: With patient ?Time For Goal Achievement: 11/15/21 ?Potential to Achieve Goals: Good ?ADL Goals ?Pt Will Perform Grooming: with set-up;standing ?Pt Will Perform Upper Body Dressing: with set-up;sitting ?Pt Will Perform Lower Body Dressing: with set-up;sit to/from stand ?Pt Will Transfer to Toilet: with modified independence;ambulating;regular height toilet  ?OT Frequency: Min 2X/week ?  ? ?Co-evaluation   ?  ?  ?  ?  ? ?  ?AM-PAC OT "6 Clicks" Daily Activity     ?Outcome Measure Help from another person eating meals?: None ?Help from another person taking care of personal grooming?: None ?Help from another person toileting, which includes using toliet, bedpan, or urinal?: A Little ?Help from another person bathing (including washing, rinsing, drying)?: A  Little ?Help from another person to put on and taking off regular upper body clothing?: A Little ?Help from another person to put on and taking off regular lower body clothing?: A Little ?6 Click Score: 20 ?  ?End of Session Equipment Utilized During Treatment: Oxygen ?Nurse Communication: Mobility status ? ?Activity Tolerance: Patient tolerated treatment well ?Patient left: in chair;with call bell/phone within reach;with family/visitor present ? ?OT Visit Diagnosis: Unsteadiness on feet (R26.81)  ?              ?Time: 3354-5625 ?OT Time Calculation (min): 14 min ?Charges:  OT General Charges ?$OT Visit: 1 Visit ?OT Evaluation ?$OT Eval Moderate Complexity: 1 Mod ? ?11/01/2021 ? ?RP, OTR/L ? ?Acute Rehabilitation Services ? ?Office:  [phone] ? ? ?Boaz Berisha D Rene Gonsoulin ?11/01/2021, 11:06 AM ?

## 2021-11-01 NOTE — Progress Notes (Signed)
Physical Therapy Treatment ?Patient Details ?Name: Scott [name] ?MRN: [id number] ?DOB: 06-Oct-1945 ?Today's Date: 11/01/2021 ? ? ?History of Present Illness Patient is a 76 y/o male who presents on 5/1 from [facility]; found to be in VT with HR in 270s bpm s/p emergent DCCV. PMH includes LVAD HMII (2016), DVT/PE, HF, CAD, DM, HTN, Ankylosing spondylitis ? ?  ?PT Comments  ? ? Pt progressing steadily towards his physical therapy goals, exhibiting improved activity tolerance and ambulation distance. Pt spouse assisting in transferring pt from wall power to batteries. Pt ambulating 270 ft with a walker at a min guard assist level; requires one seated rest break. SpO2 98% on 2L O2, HR 74-79. Pt demonstrates decreased cardiopulmonary endurance and balance deficits. Could benefit from follow up PT to address. ?   ?Recommendations for follow up therapy are one component of a multi-disciplinary discharge planning process, led by the attending physician.  Recommendations may be updated based on patient status, additional functional criteria and insurance authorization. ? ?Follow Up Recommendations ? Home health PT ?  ?  ?Assistance Recommended at Discharge Intermittent Supervision/Assistance  ?Patient can return home with the following Help with stairs or ramp for entrance;Assistance with cooking/housework;A little help with walking and/or transfers;A little help with bathing/dressing/bathroom;Assist for transportation ?  ?Equipment Recommendations ? None recommended by PT  ?  ?Recommendations for Other Services   ? ? ?  ?Precautions / Restrictions Precautions ?Precautions: Other (comment);Fall ?Precaution Comments: LVAD ?Restrictions ?Weight Bearing Restrictions: No  ?  ? ?Mobility ? Bed Mobility ?Overal bed mobility: Needs Assistance ?Bed Mobility: Supine to Sit ?  ?  ?Supine to sit: Supervision ?  ?  ?  ?  ? ?Transfers ?Overall transfer level: Needs assistance ?Equipment used: None ?Transfers: Sit to/from Stand ?Sit to  Stand: Min guard ?  ?  ?  ?  ?  ?General transfer comment: Min guard for safety ?  ? ?Ambulation/Gait ?Ambulation/Gait assistance: Min guard ?Gait Distance (Feet): 270 Feet ?Assistive device: Rolling walker (2 wheels) ?Gait Pattern/deviations: Step-through pattern, Decreased stride length, Trunk flexed ?Gait velocity: decreased ?  ?  ?General Gait Details: cues for upright posture, slow pace, min guard for safety. pt requiring one seated rest break ? ? ?Stairs ?  ?  ?  ?  ?  ? ? ?Wheelchair Mobility ?  ? ?Modified Rankin (Stroke Patients Only) ?  ? ? ?  ?Balance Overall balance assessment: Needs assistance ?Sitting-balance support: Feet supported, No upper extremity supported ?Sitting balance-Leahy Scale: Good ?  ?  ?Standing balance support: During functional activity ?Standing balance-Leahy Scale: Fair ?  ?  ?  ?  ?  ?  ?  ?  ?  ?  ?  ?  ?  ? ?  ?Cognition Arousal/Alertness: Awake/alert ?Behavior During Therapy: Flat affect ?Overall Cognitive Status: Within Functional Limits for tasks assessed ?  ?  ?  ?  ?  ?  ?  ?  ?  ?  ?  ?  ?  ?  ?  ?  ?  ?  ?  ? ?  ?Exercises General Exercises - Lower Extremity ?Ankle Circles/Pumps: Both, 20 reps, Seated ?Long Arc Quad: Both, 10 reps, Seated ? ?  ?General Comments   ?  ?  ? ?Pertinent Vitals/Pain Pain Assessment ?Pain Assessment: No/denies pain  ? ? ?Home Living   ?  ?  ?  ?  ?  ?  ?  ?  ?  ?   ?  ?  Prior Function    ?  ?  ?   ? ?PT Goals (current goals can now be found in the care plan section) Acute Rehab PT Goals ?Patient Stated Goal: feel better ?Potential to Achieve Goals: Good ?Progress towards PT goals: Progressing toward goals ? ?  ?Frequency ? ? ? Min 3X/week ? ? ? ?  ?PT Plan Discharge plan needs to be updated  ? ? ?Co-evaluation   ?  ?  ?  ?  ? ?  ?AM-PAC PT "6 Clicks" Mobility   ?Outcome Measure ? Help needed turning from your back to your side while in a flat bed without using bedrails?: None ?Help needed moving from lying on your back to sitting on the side of a  flat bed without using bedrails?: A Little ?Help needed moving to and from a bed to a chair (including a wheelchair)?: A Little ?Help needed standing up from a chair using your arms (e.g., wheelchair or bedside chair)?: A Little ?Help needed to walk in hospital room?: A Little ?Help needed climbing 3-5 steps with a railing? : A Lot ?6 Click Score: 18 ? ?  ?End of Session Equipment Utilized During Treatment: Oxygen ?Activity Tolerance: Patient tolerated treatment well ?Patient left: in chair;with call bell/phone within reach ?Nurse Communication: Mobility status ?PT Visit Diagnosis: Difficulty in walking, not elsewhere classified (R26.2);Muscle weakness (generalized) (M62.81) ?  ? ? ?Time: 6213-0865 ?PT Time Calculation (min) (ACUTE ONLY): 34 min ? ?Charges:  $Gait Training: 8-22 mins ?$Therapeutic Activity: 8-22 mins          ?          ? ?Wyona Almas, PT, DPT ?Acute Rehabilitation Services ?Pager [phone] ?Office [phone] ? ? ? ?[name] ?11/01/2021, 10:39 AM ? ?

## 2021-11-02 LAB — LACTATE DEHYDROGENASE: LDH: 477 U/L — ABNORMAL HIGH (ref 98–192)

## 2021-11-02 LAB — CBC
HCT: 40.3 % (ref 39.0–52.0)
Hemoglobin: 12.4 g/dL — ABNORMAL LOW (ref 13.0–17.0)
MCH: 28.9 pg (ref 26.0–34.0)
MCHC: 30.8 g/dL (ref 30.0–36.0)
MCV: 93.9 fL (ref 80.0–100.0)
Platelets: 163 10*3/uL (ref 150–400)
RBC: 4.29 MIL/uL (ref 4.22–5.81)
RDW: 19.5 % — ABNORMAL HIGH (ref 11.5–15.5)
WBC: 10.4 10*3/uL (ref 4.0–10.5)
nRBC: 0.6 % — ABNORMAL HIGH (ref 0.0–0.2)

## 2021-11-02 LAB — BASIC METABOLIC PANEL
Anion gap: 5 (ref 5–15)
BUN: 32 mg/dL — ABNORMAL HIGH (ref 8–23)
CO2: 36 mmol/L — ABNORMAL HIGH (ref 22–32)
Calcium: 8.7 mg/dL — ABNORMAL LOW (ref 8.9–10.3)
Chloride: 100 mmol/L (ref 98–111)
Creatinine, Ser: 1.03 mg/dL (ref 0.61–1.24)
GFR, Estimated: >60 mL/min (ref >60)
Glucose, Bld: 101 mg/dL — ABNORMAL HIGH (ref 70–99)
Potassium: 4.1 mmol/L (ref 3.5–5.1)
Sodium: 141 mmol/L (ref 135–145)

## 2021-11-02 LAB — MAGNESIUM: Magnesium: 2 mg/dL (ref 1.7–2.4)

## 2021-11-02 LAB — PROTIME-INR
INR: 3.9 — ABNORMAL HIGH (ref 0.8–1.2)
Prothrombin Time: 37.8 seconds — ABNORMAL HIGH (ref 11.4–15.2)

## 2021-11-02 LAB — COOXEMETRY PANEL
Carboxyhemoglobin: 2 % — ABNORMAL HIGH (ref 0.5–1.5)
Methemoglobin: <0.7 % (ref 0.0–1.5)
O2 Saturation: 71.5 %
Total hemoglobin: 12.8 g/dL (ref 12.0–16.0)

## 2021-11-02 MED ORDER — ENSURE ENLIVE PO LIQD
237.0000 mL | Freq: Two times a day (BID) | ORAL | Status: AC
  Administered 2021-11-02 (×2): 237 mL via ORAL

## 2021-11-02 MED ORDER — POTASSIUM CHLORIDE CRYS ER 20 MEQ PO TBCR
40.0000 meq | EXTENDED_RELEASE_TABLET | Freq: Once | ORAL | Status: AC
  Administered 2021-11-02: 40 meq via ORAL
  Filled 2021-11-02: qty 2

## 2021-11-02 MED ORDER — FUROSEMIDE 10 MG/ML IJ SOLN
80.0000 mg | Freq: Once | INTRAMUSCULAR | Status: AC
  Administered 2021-11-02: 80 mg via INTRAVENOUS
  Filled 2021-11-02: qty 8

## 2021-11-02 NOTE — Progress Notes (Signed)
Physical Therapy Treatment ?Patient Details ?Name: Scott [name] ?MRN: [id number] ?DOB: [birth year] ?Today's Date: 11/02/2021 ? ? ?History of Present Illness Patient is a 76 y/o male who presents on 5/1 from [place] clinic; found to be in VT with HR in 270s bpm s/p emergent DCCV. PMH includes LVAD HMII (2016), DVT/PE, HF, CAD, DM, HTN, Ankylosing spondylitis ? ?  ?PT Comments  ? ? Pt tolerates treatment well, ambulating for household and limited community distances. Pt is able to mobilize without physical assistance but continues to benefit from UE support to maintain balance. Pt will benefit from frequent bouts of ambulation in an effort to continue to improve strength and restore the pt's prior level of function.   ?Recommendations for follow up therapy are one component of a multi-disciplinary discharge planning process, led by the attending physician.  Recommendations may be updated based on patient status, additional functional criteria and insurance authorization. ? ?Follow Up Recommendations ? Home health PT ?  ?  ?Assistance Recommended at Discharge Intermittent Supervision/Assistance  ?Patient can return home with the following Help with stairs or ramp for entrance;Assistance with cooking/housework;A little help with walking and/or transfers;A little help with bathing/dressing/bathroom;Assist for transportation ?  ?Equipment Recommendations ? None recommended by PT  ?  ?Recommendations for Other Services   ? ? ?  ?Precautions / Restrictions Precautions ?Precautions: Fall ?Precaution Comments: LVAD for 7 years ?Restrictions ?Weight Bearing Restrictions: No  ?  ? ?Mobility ? Bed Mobility ?Overal bed mobility: Needs Assistance ?Bed Mobility: Supine to Sit ?  ?  ?Supine to sit: Supervision ?  ?  ?  ?  ? ?Transfers ?Overall transfer level: Needs assistance ?Equipment used: Rolling walker (2 wheels) ?Transfers: Sit to/from Stand ?Sit to Stand: Supervision ?  ?  ?  ?  ?  ?  ?  ? ?Ambulation/Gait ?Ambulation/Gait  assistance: Supervision ?Gait Distance (Feet): 400 Feet ?Assistive device: Rolling walker (2 wheels) ?Gait Pattern/deviations: Step-through pattern, Trunk flexed ?Gait velocity: reduced ?Gait velocity interpretation: 1.31 - 2.62 ft/sec, indicative of limited community ambulator ?  ?General Gait Details: slowed step-through gait ? ? ?Stairs ?  ?  ?  ?  ?  ? ? ?Wheelchair Mobility ?  ? ?Modified Rankin (Stroke Patients Only) ?  ? ? ?  ?Balance Overall balance assessment: Needs assistance ?Sitting-balance support: No upper extremity supported, Feet supported ?Sitting balance-Leahy Scale: Good ?  ?  ?Standing balance support: Single extremity supported, Reliant on assistive device for balance ?Standing balance-Leahy Scale: Poor ?  ?  ?  ?  ?  ?  ?  ?  ?  ?  ?  ?  ?  ? ?  ?Cognition Arousal/Alertness: Awake/alert ?Behavior During Therapy: Flat affect ?Overall Cognitive Status: Within Functional Limits for tasks assessed ?  ?  ?  ?  ?  ?  ?  ?  ?  ?  ?  ?  ?  ?  ?  ?  ?  ?  ?  ? ?  ?Exercises   ? ?  ?General Comments General comments (skin integrity, edema, etc.): VSS on RA ?  ?  ? ?Pertinent Vitals/Pain Pain Assessment ?Pain Assessment: No/denies pain  ? ? ?Home Living   ?  ?  ?  ?  ?  ?  ?  ?  ?  ?   ?  ?Prior Function    ?  ?  ?   ? ?PT Goals (current goals can now be found in the care plan section)  Acute Rehab PT Goals ?Patient Stated Goal: feel better ?Progress towards PT goals: Progressing toward goals ? ?  ?Frequency ? ? ? Min 3X/week ? ? ? ?  ?PT Plan Current plan remains appropriate  ? ? ?Co-evaluation   ?  ?  ?  ?  ? ?  ?AM-PAC PT "6 Clicks" Mobility   ?Outcome Measure ? Help needed turning from your back to your side while in a flat bed without using bedrails?: None ?Help needed moving from lying on your back to sitting on the side of a flat bed without using bedrails?: A Little ?Help needed moving to and from a bed to a chair (including a wheelchair)?: A Little ?Help needed standing up from a chair using your  arms (e.g., wheelchair or bedside chair)?: A Little ?Help needed to walk in hospital room?: A Little ?Help needed climbing 3-5 steps with a railing? : A Lot ?6 Click Score: 18 ? ?  ?End of Session   ?Activity Tolerance: Patient tolerated treatment well ?Patient left: in bed;with call bell/phone within reach;with family/visitor present ?Nurse Communication: Mobility status ?PT Visit Diagnosis: Difficulty in walking, not elsewhere classified (R26.2);Muscle weakness (generalized) (M62.81) ?  ? ? ?Time: 3013-1438 ?PT Time Calculation (min) (ACUTE ONLY): 23 min ? ?Charges:  $Gait Training: 8-22 mins ?$Therapeutic Activity: 8-22 mins          ?          ? ?Zenaida Niece, PT, DPT ?Acute Rehabilitation ?Pager: [phone] ?Office [phone] ? ? ? ?Zenaida Niece ?11/02/2021, 3:57 PM ? ?

## 2021-11-02 NOTE — Progress Notes (Signed)
BIPAP PRN no distress noted at this time. ?

## 2021-11-02 NOTE — Progress Notes (Addendum)
?Advanced Heart Failure VAD Team Note ? ?PCP-Cardiologist: Dr. Haroldine Laws  ? ?Subjective:   ? ?5/1: Admitted w/ VT w/ shock, s/p emergent DCCV. ? ?Completed amio load. Now on PO. No further sustained VT.  ?K 4.1 ?Mg 2.0  ? ?On Milrinone 0.125. Co-ox 72%.  ? ?2.2L in UOP yesterday. CVP still ~13. No dyspnea. SCr stable 1.03.  ? ?LDH 1,263>>719>>565 >>477  ? ?INR trending down, 4.1>>3.9  ? ?Echo LVEF < 20% RV severely HK. Septum midline.  ? ?Feels ok today. Ambulated w/ PT yesterday. Felt tired but no dyspnea.  ?  ? ?LVAD INTERROGATION:  ?HeartMate II LVAD:   ?Flow 6.7 liters/min, speed 9200, power 6.2, PI 4.2.  No PI events ? ?Objective:   ? ?Vital Signs:   ?Temp:  [97.8 ?F (36.6 ?C)-98.5 ?F (36.9 ?C)] 97.8 ?F (36.6 ?C) (05/03 2234) ?Pulse Rate:  [67-109] 67 (05/04 0400) ?Resp:  [16-28] 20 (05/04 0700) ?BP: (74-79)/(51-56) 74/56 (05/03 2200) ?SpO2:  [91 %-98 %] 97 % (05/04 0400) ?Weight:  [85.8 kg] 85.8 kg (05/04 0500) ?Last BM Date : 10/31/21 ?Mean arterial Pressure 80s-90s ? ?Intake/Output:  ? ?Intake/Output Summary (Last 24 hours) at 11/02/2021 0802 ?Last data filed at 11/02/2021 0700 ?Gross per 24 hour  ?Intake 387.41 ml  ?Output 2250 ml  ?Net -1862.59 ml  ?  ? ?Physical Exam  ? ?CVP 13 ?General:  well appearing, sitting up in bed. No distress   ?HEENT: normal ?Neck: supple. JVD 12 cm . Carotids 2+ bilat; no bruits. No lymphadenopathy or thyromegaly appreciated. ?Cor: Mechanical heart sounds with LVAD hum present. ?Lungs: CTAB   ?Abdomen: soft, nontender, nondistended. No hepatosplenomegaly. No bruits or masses. Good bowel sounds. ?Driveline: C/D/I; securement device intact and driveline incorporated ?Extremities: no cyanosis, clubbing, rash, edema, + RUE PICC  ?Neuro: alert & orientedx3, cranial nerves grossly intact. moves all 4 extremities w/o difficulty. Affect pleasant ? ? ?Telemetry  ? ?NSR 70s. 3 brief runs NSVT 3-6 beats  ? ?EKG  ?  ?N/a ? ?Labs  ? ?Basic Metabolic Panel: ?Recent Labs  ?Lab 10/30/21 ?0909  10/30/21 ?1051 10/30/21 ?1615 10/30/21 ?2222 10/31/21 ?0600 11/01/21 ?0430 11/02/21 ?0422  ?NA 135   < > 138 138 138 141 141  ?K 5.4*   < > 5.8* 5.0 4.9 4.5 4.1  ?CL 99  --  105 101 104 101 100  ?CO2 20*  --  23  --  27 33* 36*  ?GLUCOSE 122*  --  127* 103* 121* 111* 101*  ?BUN 107*  --  100* 97* 81* 48* 32*  ?CREATININE 2.69*  --  1.98* 1.70* 1.60* 1.16 1.03  ?CALCIUM 10.0  --  9.4  --  9.5 9.0 8.7*  ?MG 2.8*  --   --   --  2.5* 2.2 2.0  ? < > = values in this interval not displayed.  ? ? ?Liver Function Tests: ?Recent Labs  ?Lab 10/30/21 ?0909  ?AST 1,106*  ?ALT 1,170*  ?ALKPHOS 142*  ?BILITOT 4.6*  ?PROT 7.9  ?ALBUMIN 4.2  ? ?No results for input(s): LIPASE, AMYLASE in the last 168 hours. ?No results for input(s): AMMONIA in the last 168 hours. ? ?CBC: ?Recent Labs  ?Lab 10/30/21 ?0909 10/30/21 ?1051 10/30/21 ?1615 10/30/21 ?2222 10/31/21 ?0600 11/01/21 ?0430 11/02/21 ?0422  ?WBC 15.3*  --  15.5*  --  12.9* 10.4 10.4  ?HGB 15.4   < > 13.1 14.3 13.4 13.0 12.4*  ?HCT 50.2   < > 42.5 42.0 42.9 42.0 40.3  ?  MCV 95.8  --  93.6  --  93.9 94.8 93.9  ?PLT 226  --  167  --  157 161 163  ? < > = values in this interval not displayed.  ? ? ?INR: ?Recent Labs  ?Lab 10/30/21 ?0909 10/30/21 ?1615 10/31/21 ?0600 11/01/21 ?0430 11/02/21 ?0422  ?INR >10.0* 4.0* 4.8* 4.1* 3.9*  ? ? ?Other results: ? ? ? ?Imaging  ? ?ECHOCARDIOGRAM COMPLETE ? ?Result Date: 10/31/2021 ?   ECHOCARDIOGRAM REPORT   Patient Name:   Scott [name] Date of Exam: 10/31/2021 Medical Rec #:  [id number]           Height:       68.0 in Accession #:    [id number]          Weight:       195.8 lb Date of Birth:  02-15-1946           BSA:          2.025 m? Patient Age:    76 years            BP:           00/00 mmHg Patient Gender: M                   HR:           73 bpm. Exam Location:  Inpatient Procedure: 2D Echo, Cardiac Doppler, Color Doppler and Intracardiac            Opacification Agent Indications:    CHF, Tachycardia  History:        Patient has prior  history of Echocardiogram examinations, most                 recent 07/20/2016. CHF, CAD; Arrythmias:Tachycardia.  Sonographer:    Jefferey Pica Referring Phys: [place]  1. Left ventricular ejection fraction, by estimation, is <20%. The left ventricle has severely decreased function. The left ventricle demonstrates global hypokinesis. The left ventricular internal cavity size was severely dilated. Left ventricular diastolic parameters are indeterminate.  2. S/p HMII LVAD. Inflow cannula now well visualized  3. Right ventricular systolic function is severely reduced. The right ventricular size is moderately enlarged.  4. Left atrial size was moderately dilated.  5. Right atrial size was severely dilated.  6. The mitral valve is normal in structure. Mild mitral valve regurgitation.  7. The aortic valve was not well visualized. Aortic valve regurgitation is mild to moderate. No AV opening seen  8. Aortic dilatation noted. There is dilatation of the ascending aorta, measuring 43 mm. FINDINGS  Left Ventricle: Left ventricular ejection fraction, by estimation, is <20%. The left ventricle has severely decreased function. The left ventricle demonstrates global hypokinesis. The left ventricular internal cavity size was severely dilated. There is no left ventricular hypertrophy. Left ventricular diastolic parameters are indeterminate. Right Ventricle: The right ventricular size is moderately enlarged. Right vetricular wall thickness was not well visualized. Right ventricular systolic function is severely reduced. Left Atrium: Left atrial size was moderately dilated. Right Atrium: Right atrial size was severely dilated. Pericardium: The pericardium was not well visualized. Mitral Valve: The mitral valve is normal in structure. Mild mitral valve regurgitation. Tricuspid Valve: The tricuspid valve is normal in structure. Tricuspid valve regurgitation is trivial. Aortic Valve: The aortic valve was  not well visualized. Aortic valve regurgitation is mild to moderate. Pulmonic Valve: The pulmonic valve was not well visualized. Pulmonic valve regurgitation is trivial.  Aorta: The aortic root is normal in size and structure and aortic dilatation noted. There is dilatation of the ascending aorta, measuring 43 mm. IAS/Shunts: The interatrial septum was not well visualized.  LEFT VENTRICLE PLAX 2D LVIDd:         7.35 cm LVIDs:         6.70 cm LV PW:         1.10 cm LV IVS:        1.05 cm LVOT diam:     2.10 cm LVOT Area:     3.46 cm?  LEFT ATRIUM             Index        RIGHT ATRIUM           Index LA diam:        5.20 cm 2.57 cm/m?   RA Area:     28.40 cm? LA Vol (A2C):   99.3 ml 49.03 ml/m?  RA Volume:   96.20 ml  47.50 ml/m? LA Vol (A4C):   77.6 ml 38.32 ml/m? LA Biplane Vol: 89.2 ml 44.04 ml/m?                        PULMONIC VALVE AORTA                 PV Vmax:       0.55 m/s Ao Root diam: 3.95 cm PV Vmean:      29.600 cm/s Ao Asc diam:  4.30 cm PV VTI:        0.078 m                       PV Peak grad:  1.2 mmHg                       PV Mean grad:  0.0 mmHg  MITRAL VALVE               TRICUSPID VALVE MV Area (PHT): 6.65 cm?    TR Peak grad:   15.1 mmHg MV Decel Time: 114 msec    TR Vmax:        194.00 cm/s MV E velocity: 58.70 cm/s                            SHUNTS                            Systemic Diam: 2.10 cm Oswaldo Milian MD Electronically signed by Oswaldo Milian MD Signature Date/Time: 10/31/2021/11:07:09 AM    Final    ? ? ?Medications:   ? ? ?Scheduled Medications: ? amiodarone  400 mg Oral BID  ? aspirin EC  81 mg Oral Daily  ? cephALEXin  500 mg Oral Q12H  ? furosemide  40 mg Intravenous Daily  ? mouth rinse  15 mL Mouth Rinse BID  ? pantoprazole  40 mg Oral Daily  ? sodium chloride flush  10-40 mL Intracatheter Q12H  ? sodium chloride flush  10-40 mL Intracatheter Q12H  ? ? ?Infusions: ? sodium chloride Stopped (10/30/21 1447)  ? milrinone 0.125 mcg/kg/min (11/02/21 0700)  ?  norepinephrine (LEVOPHED) Adult infusion Stopped (10/30/21 1146)  ? ? ?PRN Medications: ?acetaminophen, cyclobenzaprine, ondansetron (ZOFRAN) IV, sodium chloride flush, sodium chloride flush, traMADol ? ? ?Patient Prof

## 2021-11-02 NOTE — Progress Notes (Signed)
ANTICOAGULATION CONSULT NOTE ? ?Pharmacy Consult for warfarin ?Indication:  LVAD ? ?Allergies  ?Allergen Reactions  ? Biopatch [Chlorhexidine] Dermatitis and Rash  ? ? ?Patient Measurements: ?Height: '5\' 8"'$  (172.7 cm) ?Weight: 85.8 kg (189 lb 2.5 oz) ?IBW/kg (Calculated) : 68.4 ? ? ?Vital Signs: ?Temp: 97.8 ?F (36.6 ?C) (05/03 2234) ?Temp Source: Oral (05/03 2234) ?BP: 74/56 (05/03 2200) ?Pulse Rate: 67 (05/04 0400) ? ?Labs: ?Recent Labs  ?  10/31/21 ?0600 11/01/21 ?0430 11/02/21 ?0422  ?HGB 13.4 13.0 12.4*  ?HCT 42.9 42.0 40.3  ?PLT 157 161 163  ?LABPROT 44.3* 39.6* 37.8*  ?INR 4.8* 4.1* 3.9*  ?CREATININE 1.60* 1.16 1.03  ? ? ? ?Estimated Creatinine Clearance: 68.1 mL/min (by C-G formula based on SCr of 1.03 mg/dL). ? ? ?Medical History: ?Past Medical History:  ?Diagnosis Date  ? Acute on chronic respiratory failure ([place])   ? a. 08/2014 in setting of PE.  ? Ankylosing spondylitis ([place])   ? Bilateral pulmonary embolism (H[facility])   ? a. 08/2014 - started on Coumadin. Retrievable IVC filter placed 08/27/14 due to RV strain and large clot burden.  ? CAD (coronary artery disease)   ? a. stenting of LCx 2013; b. STEMI 06/12/14 s/p PCI to LAD complicated by post cath shock requiring IABP; VT s/p DCCV, EF 20%; c. NSTEMI 06/26/14 treated medically.  ? Carotid artery disease ([place])   ? a. s/p stenting.  ? Chronic systolic CHF (congestive heart failure) ([place])   ? Diabetes ([place])   ? Hemoptysis   ? a. 08/2014 possibly due to pulm infarct/PE.  ? Hyperlipidemia   ? Hypertension   ? Hypotension   ? Ischemic cardiomyopathy   ? a. echo 08/23/2014 EF <20%, dilated CM, mod MR/TR  ? Leukocytosis   ? Leukocytosis 09/10/2014  ? Nephrolithiasis   ? Pleural effusion on right 08/2014 - small  ? Reactive thrombocytosis 09/10/2014  ? Right leg DVT ([place])   ? a. 08/2014.  ? Ventricular tachycardia ([place])   ? a. 06/2014 at time of MI, s/p DCCV.  ? ? ? ?Assessment: ?75yom with Hx HFrEF s/p LCAD  HM2 2015.  He presented today in VT > SR s/p DCCV.   ?INR elevated  > 10 on admit, LDH elevated 1090, elevated LFTs.  Now post FFP and vitamin K '2mg'$ . INR down to 3.9 today, holding warfarin. ? ?Of note patient was also on aspirin and plavix due to concern over stroke prior to admission. Aspirin continued on admission, plavix currently on hold. LDH trended down to 477. CBC stable overnight.  ? ?Goal of Therapy:  ?INR 2-2.5 ?Monitor platelets by anticoagulation protocol: Yes ?  ?Plan:  ?INR elevated - hold warfarin/AC for now ? ?Scott [name], PharmD, BCPS, BCCP ?Clinical Pharmacist ?[phone] ?Please check AMION for all [place] numbers ?11/02/2021 ? ? ?

## 2021-11-02 NOTE — Progress Notes (Signed)
LVAD Coordinator Rounding Note: ? ?Admitted 10/30/21 due to VT 270s requiring DCCV x 1.   ? ?Started feeling bad over the weekend. Complaining of weakness. Presented to [place] clinic with VT. VT 270s.  Moved urgently to ED. Underwent emergent cardioversion x1. Returned to [place] with frequent PVCs. Received 2 FFP and Vitamin K for INR > 10 on admission.  ? ?HM II LVAD implanted on 09/21/14 by Dr [name] under destination therapy criteria. ? ?Pt sitting up in bed upon my arrival. Denies complaints. Wife at bedside. Remains in SR 70s. Pt and wife requesting he no longer receive Trazodone. Medication no longer on medication list.  ? ?Echo completed- RV severely reduced. Milrinone stopped yesterday.  ? ?Drive line dressing change every 3 days. Saline only. Pt's wife to change dressing.  ? ?May transfer to [facility] when bed available.  ? ?Vital signs: ?Temp: 96.8 ?HR: 74 ?Doppler Pressure: 86 ?Automatic BP: not documented ?O2 Sat: 95% on RA ?Wt: 195.7>195.1>189.2 lb   ? ? ?LVAD interrogation reveals:  ?Speed: 9200 ?Flow: 6.1 ?Power: 6.0 w ?PI: 4.3  ? ?Alarms: none ?Events:  none ? ?Fixed speed: 9200 ?Low speed limit: 8600 ? ? ?Drive Line: Dressing clean, dry, and intact. Anchor correctly applied. Maintained every 3 days by patient's wife at home. Continue every 3 day dressing changes using daily kit, cleansing with saline only. Dressing change per bedside RN or patient's wife [name]. Dressing change due 11/04/21.  ? ?Labs:  ?LDH trend: [phone] ? ?INR trend: 10.0>4.8>4.1>3.9 ? ?Creatinine trend: 1.98>1.60>1.16>1.03 ? ?Anticoagulation Plan: ?-INR Goal: 2.0 - 3.0 ?-ASA Dose: 81 mg daily ? ?Blood Products:  ?- 10/30/21 >> 2 FFP ? ?Arrythmias: VT 270s on admission. DCCV x 1. Started on Amiodarone gtt.  ? ?Gtts: ?Amiodarone 30 mg/hr-off transitioned to PO 11/01/21 400 bid ?Milrinone 0.125 mcg/kg/hr- off 11/01/21 ? ?Plan/Recommendations:  ?1. [place] VAD coordinator with any VAD equipment or drive line issues ?2. Drive line dressing changes  every 3 days using daily kit, with saline only.  ? ?Emerson Monte RN ?VAD Coordinator  ?Office: [phone]  ?24/7 Pager: [phone]  ?

## 2021-11-03 LAB — COOXEMETRY PANEL
Carboxyhemoglobin: 2.1 % — ABNORMAL HIGH (ref 0.5–1.5)
Methemoglobin: 0.9 % (ref 0.0–1.5)
O2 Saturation: 65.8 %
Total hemoglobin: 13.8 g/dL (ref 12.0–16.0)

## 2021-11-03 LAB — BASIC METABOLIC PANEL
Anion gap: 8 (ref 5–15)
BUN: 26 mg/dL — ABNORMAL HIGH (ref 8–23)
CO2: 35 mmol/L — ABNORMAL HIGH (ref 22–32)
Calcium: 8.9 mg/dL (ref 8.9–10.3)
Chloride: 99 mmol/L (ref 98–111)
Creatinine, Ser: 1.08 mg/dL (ref 0.61–1.24)
GFR, Estimated: >60 mL/min (ref >60)
Glucose, Bld: 169 mg/dL — ABNORMAL HIGH (ref 70–99)
Potassium: 4.2 mmol/L (ref 3.5–5.1)
Sodium: 142 mmol/L (ref 135–145)

## 2021-11-03 LAB — CBC
HCT: 42.8 % (ref 39.0–52.0)
Hemoglobin: 13.2 g/dL (ref 13.0–17.0)
MCH: 29.2 pg (ref 26.0–34.0)
MCHC: 30.8 g/dL (ref 30.0–36.0)
MCV: 94.7 fL (ref 80.0–100.0)
Platelets: 183 10*3/uL (ref 150–400)
RBC: 4.52 MIL/uL (ref 4.22–5.81)
RDW: 19.6 % — ABNORMAL HIGH (ref 11.5–15.5)
WBC: 12.2 10*3/uL — ABNORMAL HIGH (ref 4.0–10.5)
nRBC: 0.4 % — ABNORMAL HIGH (ref 0.0–0.2)

## 2021-11-03 LAB — MAGNESIUM: Magnesium: 2 mg/dL (ref 1.7–2.4)

## 2021-11-03 LAB — PROTIME-INR
INR: 2.8 — ABNORMAL HIGH (ref 0.8–1.2)
Prothrombin Time: 29.1 seconds — ABNORMAL HIGH (ref 11.4–15.2)

## 2021-11-03 LAB — LACTATE DEHYDROGENASE: LDH: 491 U/L — ABNORMAL HIGH (ref 98–192)

## 2021-11-03 MED ORDER — WARFARIN SODIUM 6 MG PO TABS: ORAL_TABLET | ORAL | 1 refills | Status: DC

## 2021-11-03 MED ORDER — FUROSEMIDE 10 MG/ML IJ SOLN
40.0000 mg | Freq: Once | INTRAMUSCULAR | Status: AC
  Administered 2021-11-03: 40 mg via INTRAVENOUS
  Filled 2021-11-03: qty 4

## 2021-11-03 MED ORDER — POTASSIUM CHLORIDE CRYS ER 20 MEQ PO TBCR
20.0000 meq | EXTENDED_RELEASE_TABLET | Freq: Once | ORAL | Status: AC
  Administered 2021-11-03: 20 meq via ORAL
  Filled 2021-11-03: qty 1

## 2021-11-03 MED ORDER — AMIODARONE HCL 200 MG PO TABS: 400.0000 mg | ORAL_TABLET | Freq: Two times a day (BID) | ORAL | 1 refills | Status: DC

## 2021-11-03 NOTE — Progress Notes (Addendum)
LVAD Coordinator Rounding Note: ? ?Admitted 10/30/21 due to VT 270s requiring DCCV x 1.   ? ?Started feeling bad over the weekend. Complaining of weakness. Presented to [place] clinic with VT. VT 270s.  Moved urgently to ED. Underwent emergent cardioversion x1. Returned to [place] with frequent PVCs. Received 2 FFP and Vitamin K for INR > 10 on admission.  ? ?HM II LVAD implanted on 09/21/14 by Dr [name] under destination therapy criteria. ? ?Pt walking to the bathroom upon my arrival. Denies complaints. Wife at bedside. Remains in SR 70s.  ? ?Drive line dressing change every 3 days. Saline only. Pt's wife to change dressing.  ? ?For discharge home today per Dr Haroldine Laws. VAD clinic f/u scheduled 11/10/21 at 10:00. Discharging home with [facility].  ? ?Vital signs: ?Temp: 97.6 ?HR: 81 ?Doppler Pressure: 82 ?Automatic BP: 96/85 (91) ?O2 Sat: 95% on RA ?Wt: 195.7>195.1>189.2>186.5 lb   ? ? ?LVAD interrogation reveals:  ?Speed: 9200 ?Flow: 5.9 ?Power: 6.0 w ?PI: 4.8  ? ?Alarms: none ?Events:  none ? ?Fixed speed: 9200 ?Low speed limit: 8600 ? ? ?Drive Line: Dressing clean, dry, and intact. Anchor correctly applied. Maintained every 3 days by patient's wife at home. Continue every 3 day dressing changes using daily kit, cleansing with saline only. Dressing change per bedside RN or patient's wife [name]. Dressing change due 11/04/21.  ? ?Labs:  ?LDH trend: 1263>719>565>477>491 ? ?INR trend: 10.0>4.8>4.1>3.9>2.8 ? ?Creatinine trend: 1.98>1.60>1.16>1.03>1.08 ? ?Anticoagulation Plan: ?-INR Goal: 2.0 - 3.0 ?-ASA Dose: 81 mg daily ? ?Blood Products:  ?- 10/30/21 >> 2 FFP ? ?Arrythmias: VT 270s on admission. DCCV x 1. Started on Amiodarone gtt.  ? ?Gtts: ?Amiodarone 30 mg/hr-off transitioned to PO 11/01/21 400 bid ?Milrinone 0.125 mcg/kg/hr- off 11/01/21 ? ?Plan/Recommendations:  ?1. Page VAD coordinator with any VAD equipment or drive line issues ?2. Drive line dressing changes every 3 days using daily kit, with saline only.  ?3. For discharge  home today per Dr Haroldine Laws. VAD clinic f/u scheduled 11/10/21 at 10:00.  ? ?Emerson Monte RN ?VAD Coordinator  ?Office: [phone]  ?24/7 Pager: [phone]  ?

## 2021-11-03 NOTE — Progress Notes (Signed)
OT Cancellation Note ? ?Patient Details ?Name: Scott [name] ?MRN: [id number] ?DOB: 1946-01-18 ? ? ?Cancelled Treatment:    Reason Eval/Treat Not Completed: Patient discharging home.   ? ?Thena Devora D Arlisa Leclere ?11/03/2021, 1:46 PM ?

## 2021-11-03 NOTE — Progress Notes (Signed)
ANTICOAGULATION CONSULT NOTE ? ?Pharmacy Consult for warfarin ?Indication:  LVAD ? ?Allergies  ?Allergen Reactions  ? Biopatch [Chlorhexidine] Dermatitis and Rash  ? ? ?Patient Measurements: ?Height: '5\' 8"'$  (172.7 cm) ?Weight: 84.6 kg (186 lb 8.2 oz) ?IBW/kg (Calculated) : 68.4 ? ? ?Vital Signs: ?Temp: 97.6 ?F (36.4 ?C) (05/05 9371) ?Temp Source: Axillary (05/05 6967) ?BP: 96/85 (05/05 8938) ?Pulse Rate: 75 (05/05 0812) ? ?Labs: ?Recent Labs  ?  11/01/21 ?0430 11/02/21 ?0422 11/03/21 ?1017  ?HGB 13.0 12.4* 13.2  ?HCT 42.0 40.3 42.8  ?PLT 161 163 183  ?LABPROT 39.6* 37.8* 29.1*  ?INR 4.1* 3.9* 2.8*  ?CREATININE 1.16 1.03 1.08  ? ? ? ?Estimated Creatinine Clearance: 64.5 mL/min (by C-G formula based on SCr of 1.08 mg/dL). ? ? ?Medical History: ?Past Medical History:  ?Diagnosis Date  ? Acute on chronic respiratory failure ([place])   ? a. 08/2014 in setting of PE.  ? Ankylosing spondylitis ([place])   ? Bilateral pulmonary embolism (HCC)   ? a. 08/2014 - started on Coumadin. Retrievable IVC filter placed 08/27/14 due to RV strain and large clot burden.  ? CAD (coronary artery disease)   ? a. stenting of LCx 2013; b. STEMI 06/12/14 s/p PCI to LAD complicated by post cath shock requiring IABP; VT s/p DCCV, EF 20%; c. NSTEMI 06/26/14 treated medically.  ? Carotid artery disease ([place])   ? a. s/p stenting.  ? Chronic systolic CHF (congestive heart failure) ([place])   ? Diabetes ([place])   ? Hemoptysis   ? a. 08/2014 possibly due to pulm infarct/PE.  ? Hyperlipidemia   ? Hypertension   ? Hypotension   ? Ischemic cardiomyopathy   ? a. echo 08/23/2014 EF <20%, dilated CM, mod MR/TR  ? Leukocytosis   ? Leukocytosis 09/10/2014  ? Nephrolithiasis   ? Pleural effusion on right 08/2014 - small  ? Reactive thrombocytosis 09/10/2014  ? Right leg DVT ([place])   ? a. 08/2014.  ? Ventricular tachycardia ([place])   ? a. 06/2014 at time of MI, s/p DCCV.  ? ? ? ?Assessment: ?75yom with Hx HFrEF s/p LCAD  HM2 2015.  He presented today in VT > SR s/p DCCV.   ?INR  elevated > 10 on admit, LDH elevated 1090, elevated LFTs.  Now post FFP and vitamin K '2mg'$ . INR down to 3.9 today, holding warfarin. ? ?Of note patient was also on aspirin and plavix due to concern over stroke prior to admission. Aspirin continued on admission, plavix currently on hold. LDH trended down to 477. CBC stable overnight.  ? ?INR starting to trend down after holding dosing and given nutritional supplements the past couple days. Current INR is down to 2.8. Will restart warfarin today at discharge.  ? ?Of note we started amiodarone '400mg'$  bid this admission. Will decrease warfarin dose at discharge to '6mg'$  daily and notify the clinic of changes.  ? ?Goal of Therapy:  ?INR 2.3-3 per clinic records ?Monitor platelets by anticoagulation protocol: Yes ?  ?Plan:  ?Change Warfarin '6mg'$  daily to start tonight at discharge ?Noted patient on triple therapy -verified by wife, will need to watch closely for any signs/symptoms of bleeding ?Follow up labs in [place] ? ?Erin Hearing PharmD., BCPS ?Clinical Pharmacist ?11/03/2021 11:06 AM ? ?

## 2021-11-03 NOTE — Progress Notes (Addendum)
?Advanced Heart Failure VAD Team Note ? ?PCP-Cardiologist: Dr. Haroldine Laws  ? ?Subjective:   ? ?5/1: Admitted w/ VT w/ shock, s/p emergent DCCV. ?05/04: Milrinone off ? ? ?CO-OX 66% off milrinone. ? ?No CVP set up. 3L UOP yesterday + 1 unmeasured void. Weight down 3 lb. ? ?Scr stable.  ? ?No recurrent VT. ? ?Feeling well. Denies dyspnea wants to go home.  ? ? ?LDH 1,263>>719>>565 >>477>>491 ? ?INR trending down, 4.1>>3.9>>2.8 ? ?Echo LVEF < 20% RV severely HK. Septum midline.  ?  ? ?LVAD INTERROGATION:  ?HeartMate II LVAD:   ?Flow 6 liters/min, speed 9200, power 6, PI 4.9.  No alarms. ? ?Objective:   ? ?Vital Signs:   ?Temp:  [97.6 ?F (36.4 ?C)-97.7 ?F (36.5 ?C)] 97.6 ?F (36.4 ?C) (05/05 4970) ?Pulse Rate:  [65-80] 75 (05/05 0812) ?Resp:  [14-34] 22 (05/05 2637) ?BP: (92-96)/(79-85) 96/85 (05/05 8588) ?SpO2:  [93 %-98 %] 93 % (05/05 0812) ?Weight:  [84.6 kg] 84.6 kg (05/05 0636) ?Last BM Date : 11/02/21 ?Mean arterial Pressure 80s-90s ? ?Intake/Output:  ? ?Intake/Output Summary (Last 24 hours) at 11/03/2021 1103 ?Last data filed at 11/03/2021 0636 ?Gross per 24 hour  ?Intake 240 ml  ?Output 2875 ml  ?Net -2635 ml  ?  ? ?Physical Exam  ? ?Physical Exam: ?GENERAL: No distress. Lying in bed. ?HEENT: normal  ?NECK: Supple, JVP 10 cm.  2+ bilaterally, no bruits.  ?CARDIAC:  Mechanical heart sounds with LVAD hum present.  ?LUNGS:  Clear to auscultation bilaterally.  ?ABDOMEN:  Soft, round, nontender, positive bowel sounds x4.     ?LVAD exit site: well-healed and incorporated.  Dressing dry and intact.  No erythema or drainage.  Stabilization device present and accurately applied.  Driveline dressing is being changed daily per sterile technique. ?EXTREMITIES:  Warm and dry, no cyanosis, clubbing, rash or edem, + RUE PICC ?NEUROLOGIC:  Alert and oriented x 4.  Gait steady.  No aphasia.  No dysarthria.  Affect pleasant.    ? ? ? ?Telemetry  ? ?SR 70s-80s, no VT ? ?EKG  ?  ?N/a ? ?Labs  ? ?Basic Metabolic Panel: ?Recent Labs   ?Lab 10/30/21 ?0909 10/30/21 ?1051 10/30/21 ?1615 10/30/21 ?2222 10/31/21 ?0600 11/01/21 ?0430 11/02/21 ?0422 11/03/21 ?5027  ?NA 135   < > 138 138 138 141 141 142  ?K 5.4*   < > 5.8* 5.0 4.9 4.5 4.1 4.2  ?CL 99  --  105 101 104 101 100 99  ?CO2 20*  --  23  --  27 33* 36* 35*  ?GLUCOSE 122*  --  127* 103* 121* 111* 101* 169*  ?BUN 107*  --  100* 97* 81* 48* 32* 26*  ?CREATININE 2.69*  --  1.98* 1.70* 1.60* 1.16 1.03 1.08  ?CALCIUM 10.0  --  9.4  --  9.5 9.0 8.7* 8.9  ?MG 2.8*  --   --   --  2.5* 2.2 2.0 2.0  ? < > = values in this interval not displayed.  ? ? ?Liver Function Tests: ?Recent Labs  ?Lab 10/30/21 ?0909  ?AST 1,106*  ?ALT 1,170*  ?ALKPHOS 142*  ?BILITOT 4.6*  ?PROT 7.9  ?ALBUMIN 4.2  ? ?No results for input(s): LIPASE, AMYLASE in the last 168 hours. ?No results for input(s): AMMONIA in the last 168 hours. ? ?CBC: ?Recent Labs  ?Lab 10/30/21 ?1615 10/30/21 ?2222 10/31/21 ?0600 11/01/21 ?0430 11/02/21 ?0422 11/03/21 ?7412  ?WBC 15.5*  --  12.9* 10.4 10.4 12.2*  ?HGB 13.1  14.3 13.4 13.0 12.4* 13.2  ?HCT 42.5 42.0 42.9 42.0 40.3 42.8  ?MCV 93.6  --  93.9 94.8 93.9 94.7  ?PLT 167  --  157 161 163 183  ? ? ?INR: ?Recent Labs  ?Lab 10/30/21 ?1615 10/31/21 ?0600 11/01/21 ?0430 11/02/21 ?0422 11/03/21 ?2947  ?INR 4.0* 4.8* 4.1* 3.9* 2.8*  ? ? ?Other results: ? ? ? ?Imaging  ? ?No results found. ? ? ?Medications:   ? ? ?Scheduled Medications: ? amiodarone  400 mg Oral BID  ? aspirin EC  81 mg Oral Daily  ? cephALEXin  500 mg Oral Q12H  ? mouth rinse  15 mL Mouth Rinse BID  ? pantoprazole  40 mg Oral Daily  ? sodium chloride flush  10-40 mL Intracatheter Q12H  ? sodium chloride flush  10-40 mL Intracatheter Q12H  ? ? ?Infusions: ? sodium chloride Stopped (10/30/21 1447)  ? ? ?PRN Medications: ?acetaminophen, cyclobenzaprine, ondansetron (ZOFRAN) IV, sodium chloride flush, sodium chloride flush, traMADol ? ? ?Patient Profile  ? ?76 y/o male with h/o CAD with ankylosing spondylitis, DM2, carotid stenting, severe  ischemic CM s/p anterior STEM with VT arrest in 12/15, large PE with pulmonary infarct in 2/15 s/p IVC filter and sHMII LVAD. ? ?Presented to clinic with weakness and AMS for several days. Found to be in VT at 290. BP low. PI low on VAD. Moved emergently to ER, Then underwent emergent DC-CV. ? ?Assessment/Plan:   ? ?1. VT ?-VT 290s-->Underwent emergent cardioversion x1.  ?-No further sustained VT on tele.  ?-Continue PO amio 400 mg bid, can reduce dose at f/u next week ?-Keep K > 4.0 and Mg > 2.0  ?  ?2.  Acute on chronic systolic HF -> cardiogenic shock: Ischemic cardiomyopathy, EF 20% s/p HMII LVAD 09/20/13.   ?-Admitted w/ shock/ lactic acidosis in setting of sustained VT ?-Echo LVEF < 20% RV severely HK. Septum midline. ?-CO-OX stable off milrinone.  ?-Volume slightly elevated on exam. Give one more dose IV lasix prior to discharge. ?-Can resume 20 mg furosemide daily at home ? ?3. CAD s/p Anterior STEMI on 06/12/14 with stenting of LAD:  ?- No recent CP  ?- Off ASA.  ?- Previously stopped statin but now back on crestor 3x/week. No change ?  ?4. DL infection, chronic ?-stable ?  ?5. VAD management:  ?- Had admit in 08/2017 for elevated LDH.  ?- LDH stabilized 300-380 range. Concern has been for chronic pump clot.  ?- LDH this admit 1,263>>719>>565>>477  ?- On aspirin and plavix. On coumadin. INR >10 on admit>>VitK and FFT>>4.8>>3.9>>2.8 ?- Will try to keep INR 2.3- 2.8. Discussed with PharmD. ?- Continue Keflex as above for DL ?- VAD interrogated personally. Parameters stable. ?  ?6. AKI ?- Now resolved  ?- BL SCr ~1.1 ?- 1.9 on admit, in setting of VT/hypotension  ?- SCr 1.08 today  ?- continue to monitor  ? ?Okay for discharge later today ? ?PT/OT have seen. HH PT recommended at discharge. ? ? ?I reviewed the LVAD parameters from today, and compared the results to the patient's prior recorded data.  No programming changes were made.  The LVAD is functioning within specified parameters.  The patient performs LVAD  self-test daily.  LVAD interrogation was negative for any significant power changes, alarms or PI events/speed drops.  LVAD equipment check completed and is in good working order.  Back-up equipment present.   LVAD education done on emergency procedures and precautions and reviewed exit site care. ? ?  Length of Stay: 4 ? ?FINCH, LINDSAY N, PA-C ?11/03/2021, 11:03 AM ? ?VAD Team --- VAD ISSUES ONLY--- ?Pager [phone] (7am - 7am) ? ?Advanced Heart Failure Team  ?Pager [phone] (M-F; 7a - 5p)  ?Please contact [facility] for night-coverage after hours (5p -7a ) and weekends on [email or web address] ? ? ?Patient seen and examined with the above-signed Advanced Practice Provider and/or Housestaff. I personally reviewed laboratory data, imaging studies and relevant notes. I independently examined the patient and formulated the important aspects of the plan. I have edited the note to reflect any of my changes or salient points. I have personally discussed the plan with the patient and/or family. ? ?More alert. Able to ambulate. Renal function improved. Rhythm stable on po amio. MAPs ok.  ?LDH 491. INR 2.8  ? ?General:  NAD.  ?HEENT: normal  ?Neck: supple. JVP 9-10 Carotids 2+ bilat; no bruits. No lymphadenopathy or thryomegaly appreciated. ?Cor: LVAD hum.  ?Lungs: Clear. ?Abdomen: obese soft, nontender, non-distended. No hepatosplenomegaly. No bruits or masses. Good bowel sounds. Driveline site clean. Anchor in place.  ?Extremities: no cyanosis, clubbing, rash. Warm no edema  ?Neuro: alert & oriented x 3. No focal deficits. Moves all 4 without problem  ? ?Much improved. VT quiescent. Shock resolved. Long discussion about possible ICD. Will defer for now until the outpatient setting. Will watch closely for low flows on VAD as indicator of recurrent VT. VAD interrogated personally. Parameters stable. ? ?Home today.  ? ?[name], MD  ?7:46 PM ? ? ? ? ? ? ?  ?

## 2021-11-03 NOTE — Plan of Care (Signed)
?  Problem: Education: ?Goal: Knowledge of General Education information will improve ?Description: Including pain rating scale, medication(s)/side effects and non-pharmacologic comfort measures ?Outcome: Adequate for Discharge ?  ?Problem: Health Behavior/Discharge Planning: ?Goal: Ability to manage health-related needs will improve ?Outcome: Adequate for Discharge ?  ?Problem: Clinical Measurements: ?Goal: Ability to maintain clinical measurements within normal limits will improve ?Outcome: Adequate for Discharge ?Goal: Will remain free from infection ?Outcome: Adequate for Discharge ?Goal: Diagnostic test results will improve ?Outcome: Adequate for Discharge ?Goal: Respiratory complications will improve ?Outcome: Adequate for Discharge ?Goal: Cardiovascular complication will be avoided ?Outcome: Adequate for Discharge ?  ?Problem: Activity: ?Goal: Risk for activity intolerance will decrease ?Outcome: Adequate for Discharge ?  ?Problem: Nutrition: ?Goal: Adequate nutrition will be maintained ?Outcome: Adequate for Discharge ?  ?Problem: Coping: ?Goal: Level of anxiety will decrease ?Outcome: Adequate for Discharge ?  ?Problem: Elimination: ?Goal: Will not experience complications related to bowel motility ?Outcome: Adequate for Discharge ?Goal: Will not experience complications related to urinary retention ?Outcome: Adequate for Discharge ?  ?Problem: Pain Managment: ?Goal: General experience of comfort will improve ?Outcome: Adequate for Discharge ?  ?Problem: Safety: ?Goal: Ability to remain free from injury will improve ?Outcome: Adequate for Discharge ?  ?Problem: Skin Integrity: ?Goal: Risk for impaired skin integrity will decrease ?Outcome: Adequate for Discharge ?  ?Problem: Cardiac: ?Goal: LVAD will function as expected and patient will experience no clinical alarms ?Outcome: Adequate for Discharge ?  ?Problem: Acute Rehab PT Goals(only PT should resolve) ?Goal: Pt Will Go Supine/Side To Sit ?Outcome:  Adequate for Discharge ?Goal: Pt Will Go Sit To Supine/Side ?Outcome: Adequate for Discharge ?Goal: Patient Will Transfer Sit To/From Stand ?Outcome: Adequate for Discharge ?Goal: Pt Will Ambulate ?Outcome: Adequate for Discharge ?Goal: Pt Will Go Up/Down Stairs ?Outcome: Adequate for Discharge ?  ?Problem: Acute Rehab OT Goals (only OT should resolve) ?Goal: Pt. Will Perform Grooming ?Outcome: Adequate for Discharge ?Goal: Pt. Will Perform Upper Body Dressing ?Outcome: Adequate for Discharge ?Goal: Pt. Will Perform Lower Body Dressing ?Outcome: Adequate for Discharge ?Goal: Pt. Will Transfer To Toilet ?Outcome: Adequate for Discharge ?  ?

## 2021-11-03 NOTE — Progress Notes (Signed)
This nurse educated patient and spouse on procedure. HOB less than 45*. Held breath upon line removal held pressure for 5+ min, instructed patient to remain in bed for 54mn, and monitor and report any bleeding noted. Pressure drsg applied. Instructed to keep drsg CDI for 24 hrs. Wife VU.  Notified nurse that PICC has been removed. HFran Lowes RN VAST ?

## 2021-11-03 NOTE — Discharge Summary (Addendum)
Advanced Heart Failure Team ? ?Discharge Summary  ? ?Patient ID: Scott [name] ?MRN: [id number], DOB/AGE: 1946-04-18 76 y.o. Admit date: 10/30/2021 ?D/C date:     11/03/2021  ? ?Primary Discharge Diagnoses:  ?VT ?Acute on chronic systolic CHF/Cardiogenic shock ?CAD ?Chronic driveline infection ?HM II LVAD ?AKI ? ? ?Hospital Course: ? ?Scott [name] is a 76 y/o male with h/o CAD with ankylosing spondylitis, DM2, carotid stenting, severe ischemic CM s/p anterior STEM with VT arrest in 12/15, large PE with pulmonary infarct in 2/15 s/p IVC filter and sHMII LVAD.  ?  ?Started feeling bad over the weekend. Complaining of weakness. Presented to [facility] 05/01 with VT.VT 270s.  Moved urgently to ED. Underwent emergent cardioversion x1. Retuned to SR with frequent PVCs. Moved to ICU and started on amiodarone drip>>later converted to PO. Developed lactic acidosis and cardiogenic shock as a result of VT. Required inotrope support with milrinone which was successfully weaned. Diuresed with IV lasix. Had AKI with recovery in renal function prior to discharge.  ? ?Evaluated by PT/OT. HH PT recommended at discharge, arranged. ? ?Has f/u in VAD clinic next week. ? ?Hospital Course by Problem: ? 1. VT ?-VT 290s-->Underwent emergent cardioversion x1.  ?-No further sustained VT on tele.  ?-Continue PO amio 400 mg bid, can reduce dose at f/u next week ?-Keep K > 4.0 and Mg > 2.0  ?  ?2.  Acute on chronic systolic HF -> cardiogenic shock: Ischemic cardiomyopathy, EF 20% s/p HMII LVAD 09/20/13.   ?-Admitted w/ shock/ lactic acidosis in setting of sustained VT ?-Echo LVEF < 20% RV severely HK. Septum midline. ?-CO-OX stable off milrinone.  ?-Volume slightly elevated on exam. Diuresed with IV lasix. Gave one more dose IV lasix day of discharge. ?-Can resume 20 mg furosemide daily at home ?  ?3. CAD s/p Anterior STEMI on 06/12/14 with stenting of LAD:  ?- No recent CP  ?- Off ASA.  ?- Previously stopped statin but now back on crestor 3x/week. No  change ?  ?4. DL infection, chronic ?-stable ?  ?5. VAD management:  ?- Had admit in 08/2017 for elevated LDH.  ?- LDH stabilized 300-380 range. Concern has been for chronic pump clot.  ?- LDH this admit 1,263>>719>>565>>477  ?- On aspirin and plavix. On coumadin. INR >10 on admit>>VitK and FFT>>4.8>>3.9>>2.8 ?- Will try to keep INR 2.3- 2.8. Discussed warfarin dosing with PharmD. ?- Continue Keflex as above for DL ?- VAD interrogated personally. Parameters stable. ?  ?6. AKI ?- Now resolved  ?- BL SCr ~1.1 ?- 1.9 on admit, in setting of VT/hypotension  ?- SCr 1.08 today  ? ?Discharge Vitals: Blood pressure (!) 110/93, pulse 75, temperature 97.6 ?F (36.4 ?C), temperature source Axillary, resp. rate 20, height '5\' 8"'$  (1.727 m), weight 84.6 kg, SpO2 94 %. ? ?Labs: ?Lab Results  ?Component Value Date  ? WBC 12.2 (H) 11/03/2021  ? HGB 13.2 11/03/2021  ? HCT 42.8 11/03/2021  ? MCV 94.7 11/03/2021  ? PLT 183 11/03/2021  ?  ?Recent Labs  ?Lab 10/30/21 ?0909 10/30/21 ?1051 11/03/21 ?0450  ?NA 135   < > 142  ?K 5.4*   < > 4.2  ?CL 99   < > 99  ?CO2 20*   < > 35*  ?BUN 107*   < > 26*  ?CREATININE 2.69*   < > 1.08  ?CALCIUM 10.0   < > 8.9  ?PROT 7.9  --   --   ?BILITOT 4.6*  --   --   ?  ALKPHOS 142*  --   --   ?ALT 1,170*  --   --   ?AST 1,106*  --   --   ?GLUCOSE 122*   < > 169*  ? < > = values in this interval not displayed.  ? ?Lab Results  ?Component Value Date  ? CHOL 210 (H) 03/15/2021  ? HDL 41 03/15/2021  ? LDLCALC 160 (H) 03/15/2021  ? TRIG 43 03/15/2021  ? ?BNP (last 3 results) ?No results for input(s): BNP in the last 8760 hours. ? ?ProBNP (last 3 results) ?No results for input(s): PROBNP in the last 8760 hours. ? ? ?Diagnostic Studies/Procedures  ? ?Echo, 10/31/21: ?IMPRESSIONS  ? 1. Left ventricular ejection fraction, by estimation, is <20%. The left ventricle has severely decreased function. The left ventricle demonstrates global hypokinesis. The left ventricular internal cavity size was severely  ?dilated. Left  ventricular diastolic parameters are indeterminate.  ? 2. S/p HMII LVAD. Inflow cannula now well visualized  ? 3. Right ventricular systolic function is severely reduced. The right ventricular size is moderately enlarged.  ? 4. Left atrial size was moderately dilated.  ? 5. Right atrial size was severely dilated.  ? 6. The mitral valve is normal in structure. Mild mitral valve regurgitation.  ? 7. The aortic valve was not well visualized. Aortic valve regurgitation is mild to moderate. No AV opening seen  ? 8. Aortic dilatation noted. There is dilatation of the ascending aorta, measuring 43 mm.  ? ?Discharge Medications  ? ?Allergies as of 11/03/2021   ? ?   Reactions  ? Biopatch [chlorhexidine] Dermatitis, Rash  ? ?  ? ?  ?Medication List  ?  ? ?STOP taking these medications   ? ?sildenafil 100 MG tablet ?Commonly known as: VIAGRA ?  ? ?  ? ?TAKE these medications   ? ?acetaminophen 500 MG tablet ?Commonly known as: TYLENOL ?Take 1,000 mg by mouth every 6 (six) hours as needed for moderate pain. ?  ?amiodarone 200 MG tablet ?Commonly known as: PACERONE ?Take 2 tablets (400 mg total) by mouth 2 (two) times daily. ?  ?aspirin EC 81 MG tablet ?Take 1 tablet (81 mg total) by mouth daily. ?  ?cephALEXin 500 MG capsule ?Commonly known as: Keflex ?Take 1 capsule (500 mg total) by mouth 2 (two) times daily. ?  ?clobetasol ointment 0.05 % ?Commonly known as: TEMOVATE ?Apply 1 application topically 2 (two) times daily. ?  ?clopidogrel 75 MG tablet ?Commonly known as: PLAVIX ?Take 1 tablet (75 mg total) by mouth daily. ?  ?cyclobenzaprine 5 MG tablet ?Commonly known as: FLEXERIL ?Take 1 tablet (5 mg total) by mouth 3 (three) times daily as needed for muscle spasms. ?  ?furosemide 20 MG tablet ?Commonly known as: LASIX ?Take 1 tablet (20 mg total) by mouth daily. Take 20 mg daily; may take 40 mg if weight gain. HOLD AS OF 10/27/21 ?  ?pantoprazole 40 MG tablet ?Commonly known as: PROTONIX ?Take 1 tablet (40 mg total) by mouth at  bedtime. ?What changed: when to take this ?  ?potassium chloride SA 20 MEQ tablet ?Commonly known as: KLOR-CON M ?Every other day alternate 20 mEq (1 tablet) with 40 mEq (2 tablets) ?What changed:  ?how much to take ?when to take this ?  ?rosuvastatin 10 MG tablet ?Commonly known as: CRESTOR ?Take 10 mg by mouth 3 (three) times a week. ?  ?traMADol 50 MG tablet ?Commonly known as: ULTRAM ?Take 2 tablets (100 mg total) by mouth every 6 (  six) hours as needed for moderate pain. ?  ?traZODone 100 MG tablet ?Commonly known as: DESYREL ?Take 2 tablets (200 mg total) by mouth at bedtime. ?  ?warfarin 6 MG tablet ?Commonly known as: Jantoven ?Take as directed. If you are unsure how to take this medication, talk to your nurse or doctor. ?Original instructions: TAKE 1 TABLET DAILY ?  ? ?  ? ?  ?  ? ? ?  ?Durable Medical Equipment  ?(From admission, onward)  ?  ? ? ?  ? ?  Start     Ordered  ? 11/03/21 1323  Heart failure home health orders  (Heart failure home health orders / Face to face)  Once       ?Comments: Heart Failure Follow-up Care:  ?Verify follow-up appointments per Patient Discharge Instructions. Confirm transportation arranged. ?Reconcile home medications with discharge medication list. Remove discontinued medications from use. Assist patient/caregiver to manage medications using pill box. ?Reinforce low sodium food selection ?Assessments: ?Vital signs and oxygen saturation at each visit. ?Assess home environment for safety concerns, caregiver support and availability of low-sodium foods. ?Environmental education officer, PT/OT, Dietitian, and CNA based on assessments. ?Perform comprehensive cardiopulmonary assessment. Notify MD for any change in condition or weight gain of 3 pounds in one day or 5 pounds in one week with symptoms. ?Daily Weights and Symptom Monitoring: ?Ensure patient has access to scales. ?Teach patient/caregiver to weigh daily before breakfast and after voiding using same scale and record.    ?Teach  patient/caregiver to track weight and symptoms and when to notify Provider. ?Activity: ?Develop individualized activity plan with patient/caregiver. ?  ?Question Answer Comment  ?Heart Failure Follow-up Care

## 2021-11-03 NOTE — TOC Initial Note (Addendum)
Transition of Care (TOC) - Initial/Assessment Note  ? ? ?Patient Details  ?Name: JAKELL TRUSTY ?MRN: [id number] ?Date of Birth: Feb 21, 1946 ? ?Transition of Care [facility]) CM/SW Contact:    ?Marcheta Grammes Rexene Alberts, RN ?Phone Number: [phone] ?11/03/2021, 1:01 PM ? ?Clinical Narrative:                 ?HF TOC CM spoke to pt and wife at bedside. Offered choice and wife agreeable to [facility] for [place]. Will need HH RN/PT orders with [facility]. Contacted Elizebeth Koller, Cory with new referral. Has RW and scale at home. Wife at home to assist with care ? ? ?Expected Discharge Plan: [place] ?Barriers to Discharge: No Barriers Identified ? ? ?Patient Goals and CMS Choice ?Patient states their goals for this hospitalization and ongoing recovery are:: wants to remain independent ?CMS Medicare.gov Compare Post Acute Care list provided to:: Patient ?Choice offered to / list presented to : Patient ? ?Expected Discharge Plan and Services ?Expected Discharge Plan: [place] ?  ?Discharge Planning Services: CM Consult ?Post Acute Care Choice: Home Health ?Living arrangements for the past 2 months: [place] ?Expected Discharge Date: 11/03/21               ?  ?  ?  ?  ?  ?  ?  ?  ?  ?  ? ?Prior Living Arrangements/Services ?Living arrangements for the past 2 months: [employer] ?Lives with:: Spouse ?Patient language and need for interpreter reviewed:: Yes ?       ?Need for Family Participation in Patient Care: Yes (Comment) ?Care giver support system in place?: Yes (comment) ?Current home services: DME (LVAD, rolling walker) ?Criminal Activity/Legal Involvement Pertinent to Current Situation/Hospitalization: No - Comment as needed ? ?Activities of Daily Living ?  ?  ? ?Permission Sought/Granted ?Permission sought to share information with : Case Manager, Family Supports, PCP ?Permission granted to share information with : Yes, Verbal Permission Granted ? Share Information with NAME:  [name] ? Permission granted to share info w AGENCY: [place] ? Permission granted to share info w Relationship: wife ? Permission granted to share info w Contact Information: [phone] ? ?Emotional Assessment ?  ?Attitude/Demeanor/Rapport: Engaged ?Affect (typically observed): Accepting ?Orientation: : Oriented to Self, Oriented to Place, Oriented to  Time, Oriented to Situation ?  ?Psych Involvement: No (comment) ? ?Admission diagnosis:  V-tach [facility]) [I47.20] ?Ventricular tachycardia ([place]) [I47.20] ?Patient Active Problem List  ? Diagnosis Date Noted  ? V-tach ([place]) 10/30/2021  ? Ventricular tachycardia ([place]) 10/30/2021  ? Elevated LDH   ? Fungal rash of trunk 03/17/2015  ? Right femoral vein DVT ([place]) 01/10/2015  ? Chronic anticoagulation 12/02/2014  ? Anxiety state 10/11/2014  ? Sleep disturbance 10/11/2014  ? LVAD (left ventricular assist device) present ([place])   ? PAF (paroxysmal atrial fibrillation) ([place])   ? CHF (congestive heart failure) ([place]) 09/21/2014  ? Palliative care encounter 09/16/2014  ? Weakness generalized 09/16/2014  ? Encounter for therapeutic drug monitoring 09/08/2014  ? Cough with hemoptysis   ? Hypoxia   ? PE (pulmonary embolism) 08/26/2014  ? Pulmonary embolus ([place]) 08/26/2014  ? CAD in native artery 06/22/2014  ? Chronic systolic heart failure ([place]) 06/22/2014  ? Abdominal pain, acute   ? STEMI (ST elevation myocardial infarction) ([place]) 06/12/2014  ? VT (ventricular tachycardia) ([place])   ? ST elevation myocardial infarction involving left anterior descending (LAD) coronary artery ([place])   ? ?  PCP:  [name], MD ?Pharmacy:   ?[id number] [place] [facility] [address] ?[address] [place]?Phone: [phone]: [phone]? ?[facility] [place]?Phone: [phone]: [phone]? ?[facility] ?[address]?Phone: [phone]: [phone] ? ? ? ? ?Social Determinants of Health (SDOH) Interventions ?  ? ?Readmission Risk Interventions ?   ? View : No data to display.  ?  ?  ?  ? ? ? ?

## 2021-11-03 NOTE — Plan of Care (Signed)
?  Problem: Education: ?Goal: Knowledge of General Education information will improve ?Description: Including pain rating scale, medication(s)/side effects and non-pharmacologic comfort measures ?Outcome: Progressing ?  ?Problem: Health Behavior/Discharge Planning: ?Goal: Ability to manage health-related needs will improve ?Outcome: Progressing ?  ?Problem: Clinical Measurements: ?Goal: Ability to maintain clinical measurements within normal limits will improve ?Outcome: Progressing ?Goal: Will remain free from infection ?Outcome: Progressing ?Goal: Diagnostic test results will improve ?Outcome: Progressing ?Goal: Respiratory complications will improve ?Outcome: Progressing ?Goal: Cardiovascular complication will be avoided ?Outcome: Progressing ?  ?Problem: Activity: ?Goal: Risk for activity intolerance will decrease ?Outcome: Progressing ?  ?Problem: Nutrition: ?Goal: Adequate nutrition will be maintained ?Outcome: Progressing ?  ?Problem: Coping: ?Goal: Level of anxiety will decrease ?Outcome: Progressing ?  ?Problem: Elimination: ?Goal: Will not experience complications related to bowel motility ?Outcome: Progressing ?Goal: Will not experience complications related to urinary retention ?Outcome: Progressing ?  ?Problem: Pain Managment: ?Goal: General experience of comfort will improve ?Outcome: Progressing ?  ?Problem: Safety: ?Goal: Ability to remain free from injury will improve ?Outcome: Progressing ?  ?Problem: Skin Integrity: ?Goal: Risk for impaired skin integrity will decrease ?Outcome: Progressing ?  ?Problem: Cardiac: ?Goal: LVAD will function as expected and patient will experience no clinical alarms ?Outcome: Progressing ?  ?

## 2021-11-03 NOTE — Progress Notes (Signed)
Patient given discharge instructions and stated understanding. 

## 2021-11-03 NOTE — Care Management Important Message (Signed)
Important Message ? ?Patient Details  ?Name: Scott [name] ?MRN: [id number] ?Date of Birth: 1946/05/20 ? ? ?Medicare Important Message Given:  Yes ? ? ? ? ?[name] ?11/03/2021, 4:06 PM ?

## 2021-11-07 ENCOUNTER — Other Ambulatory Visit (HOSPITAL_COMMUNITY): Payer: Medicare Other

## 2021-11-09 ENCOUNTER — Other Ambulatory Visit (HOSPITAL_COMMUNITY): Payer: Self-pay | Admitting: *Deleted

## 2021-11-09 DIAGNOSIS — Z7901 Long term (current) use of anticoagulants: Secondary | ICD-10-CM

## 2021-11-09 DIAGNOSIS — I5041 Acute combined systolic (congestive) and diastolic (congestive) heart failure: Secondary | ICD-10-CM

## 2021-11-09 DIAGNOSIS — Z95811 Presence of heart assist device: Secondary | ICD-10-CM

## 2021-11-10 ENCOUNTER — Encounter (HOSPITAL_COMMUNITY): Payer: Self-pay | Admitting: Internal Medicine

## 2021-11-10 ENCOUNTER — Inpatient Hospital Stay (HOSPITAL_COMMUNITY)
Admission: EM | Admit: 2021-11-10 | Discharge: 2021-11-16 | DRG: 227 | Disposition: A | Discharge status: Home / Self Care | Payer: Medicare Other | Attending: Internal Medicine | Admitting: Internal Medicine

## 2021-11-10 ENCOUNTER — Encounter (HOSPITAL_COMMUNITY): Payer: Self-pay

## 2021-11-10 ENCOUNTER — Other Ambulatory Visit: Payer: Self-pay

## 2021-11-10 ENCOUNTER — Ambulatory Visit (HOSPITAL_COMMUNITY)
Admit: 2021-11-10 | Discharge: 2021-11-10 | Disposition: A | Discharge status: Home / Self Care | Payer: Medicare Other | Attending: Cardiology | Admitting: Cardiology

## 2021-11-10 DIAGNOSIS — Z79899 Other long term (current) drug therapy: Secondary | ICD-10-CM | POA: Diagnosis not present

## 2021-11-10 DIAGNOSIS — E875 Hyperkalemia: Secondary | ICD-10-CM | POA: Diagnosis present

## 2021-11-10 DIAGNOSIS — Z7982 Long term (current) use of aspirin: Secondary | ICD-10-CM

## 2021-11-10 DIAGNOSIS — Z8249 Family history of ischemic heart disease and other diseases of the circulatory system: Secondary | ICD-10-CM | POA: Diagnosis not present

## 2021-11-10 DIAGNOSIS — Z87442 Personal history of urinary calculi: Secondary | ICD-10-CM

## 2021-11-10 DIAGNOSIS — E1122 Type 2 diabetes mellitus with diabetic chronic kidney disease: Secondary | ICD-10-CM | POA: Diagnosis present

## 2021-11-10 DIAGNOSIS — N182 Chronic kidney disease, stage 2 (mild): Secondary | ICD-10-CM | POA: Diagnosis present

## 2021-11-10 DIAGNOSIS — I5022 Chronic systolic (congestive) heart failure: Secondary | ICD-10-CM | POA: Diagnosis not present

## 2021-11-10 DIAGNOSIS — J90 Pleural effusion, not elsewhere classified: Secondary | ICD-10-CM | POA: Diagnosis not present

## 2021-11-10 DIAGNOSIS — I130 Hypertensive heart and chronic kidney disease with heart failure and stage 1 through stage 4 chronic kidney disease, or unspecified chronic kidney disease: Secondary | ICD-10-CM | POA: Diagnosis present

## 2021-11-10 DIAGNOSIS — I700 Atherosclerosis of aorta: Secondary | ICD-10-CM | POA: Diagnosis not present

## 2021-11-10 DIAGNOSIS — N179 Acute kidney failure, unspecified: Secondary | ICD-10-CM | POA: Diagnosis present

## 2021-11-10 DIAGNOSIS — I472 Ventricular tachycardia, unspecified: Secondary | ICD-10-CM | POA: Diagnosis not present

## 2021-11-10 DIAGNOSIS — Z95811 Presence of heart assist device: Secondary | ICD-10-CM | POA: Diagnosis not present

## 2021-11-10 DIAGNOSIS — E1151 Type 2 diabetes mellitus with diabetic peripheral angiopathy without gangrene: Secondary | ICD-10-CM | POA: Diagnosis present

## 2021-11-10 DIAGNOSIS — Z86718 Personal history of other venous thrombosis and embolism: Secondary | ICD-10-CM | POA: Diagnosis not present

## 2021-11-10 DIAGNOSIS — I252 Old myocardial infarction: Secondary | ICD-10-CM

## 2021-11-10 DIAGNOSIS — I2510 Atherosclerotic heart disease of native coronary artery without angina pectoris: Secondary | ICD-10-CM | POA: Diagnosis not present

## 2021-11-10 DIAGNOSIS — I5041 Acute combined systolic (congestive) and diastolic (congestive) heart failure: Secondary | ICD-10-CM

## 2021-11-10 DIAGNOSIS — I517 Cardiomegaly: Secondary | ICD-10-CM | POA: Diagnosis not present

## 2021-11-10 DIAGNOSIS — I493 Ventricular premature depolarization: Secondary | ICD-10-CM | POA: Diagnosis present

## 2021-11-10 DIAGNOSIS — R0609 Other forms of dyspnea: Secondary | ICD-10-CM

## 2021-11-10 DIAGNOSIS — R791 Abnormal coagulation profile: Secondary | ICD-10-CM | POA: Diagnosis present

## 2021-11-10 DIAGNOSIS — R9431 Abnormal electrocardiogram [ECG] [EKG]: Secondary | ICD-10-CM | POA: Diagnosis not present

## 2021-11-10 DIAGNOSIS — Z95828 Presence of other vascular implants and grafts: Secondary | ICD-10-CM

## 2021-11-10 DIAGNOSIS — Z7901 Long term (current) use of anticoagulants: Secondary | ICD-10-CM | POA: Diagnosis not present

## 2021-11-10 DIAGNOSIS — G479 Sleep disorder, unspecified: Secondary | ICD-10-CM

## 2021-11-10 DIAGNOSIS — I442 Atrioventricular block, complete: Secondary | ICD-10-CM | POA: Diagnosis present

## 2021-11-10 DIAGNOSIS — Z7902 Long term (current) use of antithrombotics/antiplatelets: Secondary | ICD-10-CM

## 2021-11-10 DIAGNOSIS — I5023 Acute on chronic systolic (congestive) heart failure: Secondary | ICD-10-CM

## 2021-11-10 DIAGNOSIS — I255 Ischemic cardiomyopathy: Secondary | ICD-10-CM | POA: Diagnosis not present

## 2021-11-10 DIAGNOSIS — I480 Paroxysmal atrial fibrillation: Secondary | ICD-10-CM

## 2021-11-10 DIAGNOSIS — Z86711 Personal history of pulmonary embolism: Secondary | ICD-10-CM

## 2021-11-10 DIAGNOSIS — Z955 Presence of coronary angioplasty implant and graft: Secondary | ICD-10-CM

## 2021-11-10 DIAGNOSIS — Z9049 Acquired absence of other specified parts of digestive tract: Secondary | ICD-10-CM

## 2021-11-10 DIAGNOSIS — I4901 Ventricular fibrillation: Secondary | ICD-10-CM | POA: Diagnosis present

## 2021-11-10 DIAGNOSIS — I420 Dilated cardiomyopathy: Secondary | ICD-10-CM | POA: Diagnosis present

## 2021-11-10 DIAGNOSIS — Z888 Allergy status to other drugs, medicaments and biological substances status: Secondary | ICD-10-CM

## 2021-11-10 DIAGNOSIS — Z87891 Personal history of nicotine dependence: Secondary | ICD-10-CM

## 2021-11-10 DIAGNOSIS — Z8674 Personal history of sudden cardiac arrest: Secondary | ICD-10-CM

## 2021-11-10 DIAGNOSIS — J984 Other disorders of lung: Secondary | ICD-10-CM | POA: Diagnosis not present

## 2021-11-10 DIAGNOSIS — E785 Hyperlipidemia, unspecified: Secondary | ICD-10-CM | POA: Diagnosis present

## 2021-11-10 LAB — CBC
HCT: 54 % — ABNORMAL HIGH (ref 39.0–52.0)
Hemoglobin: 16.2 g/dL (ref 13.0–17.0)
MCH: 29.2 pg (ref 26.0–34.0)
MCHC: 30 g/dL (ref 30.0–36.0)
MCV: 97.5 fL (ref 80.0–100.0)
Platelets: 356 10*3/uL (ref 150–400)
RBC: 5.54 MIL/uL (ref 4.22–5.81)
RDW: 20.9 % — ABNORMAL HIGH (ref 11.5–15.5)
WBC: 11.7 10*3/uL — ABNORMAL HIGH (ref 4.0–10.5)
nRBC: 0 % (ref 0.0–0.2)

## 2021-11-10 LAB — COMPREHENSIVE METABOLIC PANEL
ALT: 108 U/L — ABNORMAL HIGH (ref 0–44)
AST: 53 U/L — ABNORMAL HIGH (ref 15–41)
Albumin: 3.5 g/dL (ref 3.5–5.0)
Alkaline Phosphatase: 140 U/L — ABNORMAL HIGH (ref 38–126)
Anion gap: 9 (ref 5–15)
BUN: 32 mg/dL — ABNORMAL HIGH (ref 8–23)
CO2: 24 mmol/L (ref 22–32)
Calcium: 8.9 mg/dL (ref 8.9–10.3)
Chloride: 108 mmol/L (ref 98–111)
Creatinine, Ser: 1.69 mg/dL — ABNORMAL HIGH (ref 0.61–1.24)
GFR, Estimated: 42 mL/min — ABNORMAL LOW (ref >60)
Glucose, Bld: 133 mg/dL — ABNORMAL HIGH (ref 70–99)
Potassium: 5.8 mmol/L — ABNORMAL HIGH (ref 3.5–5.1)
Sodium: 141 mmol/L (ref 135–145)
Total Bilirubin: 2.2 mg/dL — ABNORMAL HIGH (ref 0.3–1.2)
Total Protein: 7.1 g/dL (ref 6.5–8.1)

## 2021-11-10 LAB — CBC WITH DIFFERENTIAL/PLATELET
Abs Immature Granulocytes: 0.08 10*3/uL — ABNORMAL HIGH (ref 0.00–0.07)
Basophils Absolute: 0 10*3/uL (ref 0.0–0.1)
Basophils Relative: 0 %
Eosinophils Absolute: 0 10*3/uL (ref 0.0–0.5)
Eosinophils Relative: 0 %
HCT: 43.6 % (ref 39.0–52.0)
Hemoglobin: 13.6 g/dL (ref 13.0–17.0)
Immature Granulocytes: 1 %
Lymphocytes Relative: 3 %
Lymphs Abs: 0.4 10*3/uL — ABNORMAL LOW (ref 0.7–4.0)
MCH: 29.7 pg (ref 26.0–34.0)
MCHC: 31.2 g/dL (ref 30.0–36.0)
MCV: 95.2 fL (ref 80.0–100.0)
Monocytes Absolute: 1.2 10*3/uL — ABNORMAL HIGH (ref 0.1–1.0)
Monocytes Relative: 9 %
Neutro Abs: 11 10*3/uL — ABNORMAL HIGH (ref 1.7–7.7)
Neutrophils Relative %: 87 %
Platelets: 340 10*3/uL (ref 150–400)
RBC: 4.58 MIL/uL (ref 4.22–5.81)
RDW: 20 % — ABNORMAL HIGH (ref 11.5–15.5)
WBC: 12.7 10*3/uL — ABNORMAL HIGH (ref 4.0–10.5)
nRBC: 0 % (ref 0.0–0.2)

## 2021-11-10 LAB — BASIC METABOLIC PANEL
Anion gap: 8 (ref 5–15)
BUN: 29 mg/dL — ABNORMAL HIGH (ref 8–23)
CO2: 24 mmol/L (ref 22–32)
Calcium: 9 mg/dL (ref 8.9–10.3)
Chloride: 108 mmol/L (ref 98–111)
Creatinine, Ser: 1.41 mg/dL — ABNORMAL HIGH (ref 0.61–1.24)
GFR, Estimated: 52 mL/min — ABNORMAL LOW (ref >60)
Glucose, Bld: 115 mg/dL — ABNORMAL HIGH (ref 70–99)
Potassium: 4.8 mmol/L (ref 3.5–5.1)
Sodium: 140 mmol/L (ref 135–145)

## 2021-11-10 LAB — I-STAT CHEM 8, ED
BUN: 59 mg/dL — ABNORMAL HIGH (ref 8–23)
Calcium, Ion: 0.94 mmol/L — ABNORMAL LOW (ref 1.15–1.40)
Chloride: 108 mmol/L (ref 98–111)
Creatinine, Ser: 1.7 mg/dL — ABNORMAL HIGH (ref 0.61–1.24)
Glucose, Bld: 142 mg/dL — ABNORMAL HIGH (ref 70–99)
HCT: 55 % — ABNORMAL HIGH (ref 39.0–52.0)
Hemoglobin: 18.7 g/dL — ABNORMAL HIGH (ref 13.0–17.0)
Potassium: 8.4 mmol/L (ref 3.5–5.1)
Sodium: 137 mmol/L (ref 135–145)
TCO2: 24 mmol/L (ref 22–32)

## 2021-11-10 LAB — LACTIC ACID, PLASMA
Lactic Acid, Venous: 2.3 mmol/L (ref 0.5–1.9)
Lactic Acid, Venous: 2.1 mmol/L (ref 0.5–1.9)

## 2021-11-10 LAB — I-STAT ARTERIAL BLOOD GAS, ED
Acid-base deficit: 3 mmol/L — ABNORMAL HIGH (ref 0.0–2.0)
Bicarbonate: 23.3 mmol/L (ref 20.0–28.0)
Calcium, Ion: 1.21 mmol/L (ref 1.15–1.40)
HCT: 44 % (ref 39.0–52.0)
Hemoglobin: 15 g/dL (ref 13.0–17.0)
O2 Saturation: 100 %
Patient temperature: 97
Potassium: 5.1 mmol/L (ref 3.5–5.1)
Sodium: 140 mmol/L (ref 135–145)
TCO2: 25 mmol/L (ref 22–32)
pCO2 arterial: 41.3 mmHg (ref 32–48)
pH, Arterial: 7.354 (ref 7.35–7.45)
pO2, Arterial: 268 mmHg — ABNORMAL HIGH (ref 83–108)

## 2021-11-10 LAB — PROTIME-INR
INR: 8.2 (ref 0.8–1.2)
INR: 4.1 (ref 0.8–1.2)
Prothrombin Time: 67.5 seconds — ABNORMAL HIGH (ref 11.4–15.2)
Prothrombin Time: 39.6 seconds — ABNORMAL HIGH (ref 11.4–15.2)

## 2021-11-10 LAB — LACTATE DEHYDROGENASE: LDH: 437 U/L — ABNORMAL HIGH (ref 98–192)

## 2021-11-10 LAB — GLUCOSE, CAPILLARY: Glucose-Capillary: 98 mg/dL (ref 70–99)

## 2021-11-10 LAB — MRSA NEXT GEN BY PCR, NASAL: MRSA by PCR Next Gen: NOT DETECTED

## 2021-11-10 LAB — MAGNESIUM: Magnesium: 2.1 mg/dL (ref 1.7–2.4)

## 2021-11-10 MED ORDER — ETOMIDATE 2 MG/ML IV SOLN: 10.0000 mg | Freq: Once | INTRAVENOUS | Status: DC

## 2021-11-10 MED ORDER — SODIUM CHLORIDE 0.9% FLUSH
3.0000 mL | Freq: Two times a day (BID) | INTRAVENOUS | Status: DC
  Administered 2021-11-10 – 2021-11-15 (×9): 3 mL via INTRAVENOUS

## 2021-11-10 MED ORDER — SODIUM CHLORIDE 0.9 % IV SOLN
250.0000 mL | INTRAVENOUS | Status: DC | PRN
  Administered 2021-11-14: 250 mL via INTRAVENOUS

## 2021-11-10 MED ORDER — PANTOPRAZOLE SODIUM 40 MG PO TBEC
40.0000 mg | DELAYED_RELEASE_TABLET | Freq: Every day | ORAL | Status: DC
  Administered 2021-11-10 – 2021-11-16 (×7): 40 mg via ORAL
  Filled 2021-11-10 (×7): qty 1

## 2021-11-10 MED ORDER — MIDAZOLAM HCL 2 MG/2ML IJ SOLN
INTRAMUSCULAR | Status: AC
  Filled 2021-11-10: qty 2

## 2021-11-10 MED ORDER — ATROPINE SULFATE 1 MG/ML IJ SOLN
INTRAMUSCULAR | Status: AC | PRN
Start: 2021-11-10 — End: 2021-11-10
  Administered 2021-11-10 (×2): .5 mg via INTRAVENOUS

## 2021-11-10 MED ORDER — SODIUM CHLORIDE 0.9% FLUSH: 3.0000 mL | INTRAVENOUS | Status: DC | PRN

## 2021-11-10 MED ORDER — PHENYLEPHRINE HCL (PRESSORS) 10 MG/ML IV SOLN
INTRAVENOUS | Status: AC | PRN
  Administered 2021-11-10: 80 ug via INTRAVENOUS

## 2021-11-10 MED ORDER — SODIUM ZIRCONIUM CYCLOSILICATE 10 G PO PACK
10.0000 g | PACK | Freq: Once | ORAL | Status: AC
  Administered 2021-11-10: 10 g via ORAL
  Filled 2021-11-10: qty 1

## 2021-11-10 MED ORDER — ACETAMINOPHEN 325 MG PO TABS
650.0000 mg | ORAL_TABLET | ORAL | Status: DC | PRN
  Filled 2021-11-10: qty 2

## 2021-11-10 MED ORDER — ASPIRIN EC 81 MG PO TBEC
81.0000 mg | DELAYED_RELEASE_TABLET | Freq: Every day | ORAL | Status: DC
  Administered 2021-11-11 – 2021-11-16 (×6): 81 mg via ORAL
  Filled 2021-11-10 (×7): qty 1

## 2021-11-10 MED ORDER — VITAMIN K1 10 MG/ML IJ SOLN
2.0000 mg | Freq: Once | INTRAVENOUS | Status: AC
  Administered 2021-11-10: 2 mg via INTRAVENOUS
  Filled 2021-11-10: qty 0.2

## 2021-11-10 MED ORDER — ETOMIDATE 2 MG/ML IV SOLN
INTRAVENOUS | Status: AC | PRN
  Administered 2021-11-10: 10 mg via INTRAVENOUS

## 2021-11-10 MED ORDER — DOPAMINE-DEXTROSE 3.2-5 MG/ML-% IV SOLN
1.0000 ug/kg/min | INTRAVENOUS | Status: DC
  Administered 2021-11-10: 5 ug/kg/min via INTRAVENOUS
  Administered 2021-11-11: 4 ug/kg/min via INTRAVENOUS
  Filled 2021-11-10: qty 250

## 2021-11-10 MED ORDER — CEPHALEXIN 500 MG PO CAPS
500.0000 mg | ORAL_CAPSULE | Freq: Two times a day (BID) | ORAL | Status: DC
  Administered 2021-11-10 – 2021-11-16 (×12): 500 mg via ORAL
  Filled 2021-11-10 (×12): qty 1

## 2021-11-10 MED ORDER — ONDANSETRON HCL 4 MG/2ML IJ SOLN: 4.0000 mg | Freq: Four times a day (QID) | INTRAMUSCULAR | Status: DC | PRN

## 2021-11-10 MED ORDER — SODIUM CHLORIDE 0.9% IV SOLUTION: Freq: Once | INTRAVENOUS | Status: AC

## 2021-11-10 MED ORDER — MIDAZOLAM HCL 2 MG/2ML IJ SOLN
INTRAMUSCULAR | Status: AC | PRN
  Administered 2021-11-10: .5 mg via INTRAVENOUS

## 2021-11-10 MED ORDER — MIDAZOLAM HCL 2 MG/2ML IJ SOLN: 0.5000 mg | Freq: Once | INTRAMUSCULAR | Status: DC

## 2021-11-10 MED ORDER — ROSUVASTATIN CALCIUM 5 MG PO TABS
10.0000 mg | ORAL_TABLET | ORAL | Status: DC
  Administered 2021-11-10 – 2021-11-15 (×3): 10 mg via ORAL
  Filled 2021-11-10 (×3): qty 2

## 2021-11-10 NOTE — Progress Notes (Addendum)
LVAD Coordinator ED Encounter ? ?Scott [name] a 76 y.o. male that presented to [facility] ER today due to VT. He has a past medical history  has a past medical history of Acute on chronic respiratory failure ([place]), Ankylosing spondylitis ([place]), Bilateral pulmonary embolism ([place]), CAD (coronary artery disease), Carotid artery disease ([place]), Chronic systolic CHF (congestive heart failure) ([place]), Diabetes ([place]), Hemoptysis, Hyperlipidemia, Hypertension, Hypotension, Ischemic cardiomyopathy, Leukocytosis, Leukocytosis (09/10/2014), Nephrolithiasis, Pleural effusion on right (08/2014 - small), Reactive thrombocytosis (09/10/2014), Right leg DVT ([place]), and Ventricular tachycardia ([place])..  ? ?LVAD is a HM II and was implanted on 09/21/14 by Dr [name] for destination therapy.  ? ?Pt presented to clinic this morning after calling VAD coordinator last night for low PI and flow ---. Pt denies lightheadedness, dizziness, falls, shortness of breath, signs of bleeding, or feeling poorly. Pt is slightly jaundice. Placed pt on monitor: VT rate 180s - 220s. Transported pt to ER for emergent cardioversion.  ? ?Labs collected in ER- lactic, CBC, CMET, INR, LDH, and Mag.  ? ?Received Neosynephrine, Etomidate, and Versed for cardioversion. DCCV at 200 j x 1 at 0834. Pt lethargic post cardioversion. Placed on Bipap. ABG drawn- WNL.  ? ?Post cardioversion pt in complete heart block 30s - 40s. Atropine 1 mg IV x 2 given. Minimal response noted. Per Dr Haroldine Laws start Dopamine @ 5 mcg/kg/min.  ? ?Plan to admit to [place] once bed available.  ? ?Vital signs: ?HR: 192 - 232 VT.     Post DVVC: CHB 30s - 46s  ?Doppler MAP: 80, then 98 post DCCV ?Automated BP: 119/105 (111) post cardioversion ?O2 Sat: UTO- Placed on bipap. O2 100% on ABG ? ?LVAD interrogation reveals:  ?Speed: 9200 ?Flow: 3.1 or - - - ?Power: 4.4 w  ?PI: 5.3 ? ?Alarms: multiple LOW FLOW alarms since 2300 yesterday ? ?Drive Line: Existing VAD dressing removed and site care performed  using sterile technique. Drive line exit site cleaned with SALINE wipe x 2, allowed to dry, and gauze dressing WITHOUT silver strip applied. Exit site healed and incorporated, the velour is fully implanted at exit site. Small amount of brown sticky drainage. Slight redness, no tenderness, foul odor or rash noted. Drive line anchor re-applied. Will need daily dressing changes using saline only, no silver strip. Dressing to be changed daily by pt's wife or bedside nurse. Next dressing change due 11/11/21.  ? ?Significant Events with LVAD: ?Recent admission 10/30/21 - 11/03/21 for VT and cardiogenic shock.  ? ?Updated VAD Providers (Dr Haroldine Laws) about the above. No LVAD issues and pump is functioning as expected. Able to independently manage LVAD equipment. No LVAD needs at this time.  ? ?Emerson Monte RN ?VAD Coordinator  ?Office: [phone]  ?24/7 Pager: [phone]  ? ? ?

## 2021-11-10 NOTE — Consult Note (Addendum)
?Cardiology Consultation:  ? ?Patient ID: Scott [name] ?MRN: [id number]; DOB: 16-Apr-1946 ? ?Admit date: 11/10/2021 ?Date of Consult: 11/10/2021 ? ?PCP:  [name], MD ?  ?[facility] Providers ?Cardiologist:  None  ?Advanced Heart Failure:  [name], MD  ? ? ?Patient Profile:  ? ?Scott [name] is a 76 y.o. male with a hx of CAD anterior STEMI with VT arrest in 12/15, large PE with pulmonary infarct in 2/15 s/p IVC filter, severe ICM > HMII LVAD (09/21/2014), ankylosig spondylitis, DM, , PVD s/p carotid stent,  who is being seen 11/10/2021 for the evaluation of recurrent VT at the request of Dr. Haroldine Laws. ? ?History of Present Illness:  ? ?Mr. Kisiel 10/30/21 went to the VAD clinic reported over the weekend not feeling well, was in VT 270's and brought urgently to the ER and cardioverted > SR w/PVCs, admitted and started on Amio gtt, required Bipap and electrolyte management with his K in the 5's tx with lokelma ?LFTs were markedly abnormal, INR >10, H/H stable and AKI, and shock, all of which felt 2/2 likely days of VT. ?Numbers improved, able to get of pressor, inotrope, diuresed,  and transitioned to PO amio and able to discharge 11/03/21 ? ?TODAY  ?on arrival to the LVAD clinic he was again in VT 200's, initial rhythm/EKG is VF and again brought to the ER and emergently cardioverted > SB/heart block.  Started on dopamine ?K+ was 5.8 treated with lokelma ?INR 8.1 planned for FFP and Vit K ?AKI/CKD ?Pending recovery of HR/conduction to  resume amio ? ?EP is asked to the case for consideration of ICD ? ?LABS ?K+ 8.4 (I-stat) > 5.1 (from ABG) > 5.8 lab draw ?Mag 2.1 ?BUN/Creat 32/1.69 ?AST 52 ?ALT 108 ?Lactic acid 2.3 ?WBC 12.7 ?H/H 13/43 ?Plts 340 ?INR 8.2 ? ?On Dopamine 5 ? ?The patient reports that generally never does he feel well, and neither this time or the last did he have an overt sense of anything going on or clear change in how he felt. ?His wife is very involved and  knowledgeable, monitors him closely, reports that the only indication of anything unusual last time was intermittently they got a --- reading for pump flow on his VAD rather then a numerical reading. ? ?Yesterday afternoon > evening noted again persistently a --- on his VAD pump flow.  Again without any overt notable clinical change, perhaps a little more  tired only  ? ?He has not fainted ? ? ?Past Medical History:  ?Diagnosis Date  ? Acute on chronic respiratory failure ([place])   ? a. 08/2014 in setting of PE.  ? Ankylosing spondylitis ([place])   ? Bilateral pulmonary embolism (H[facility])   ? a. 08/2014 - started on Coumadin. Retrievable IVC filter placed 08/27/14 due to RV strain and large clot burden.  ? CAD (coronary artery disease)   ? a. stenting of LCx 2013; b. STEMI 06/12/14 s/p PCI to LAD complicated by post cath shock requiring IABP; VT s/p DCCV, EF 20%; c. NSTEMI 06/26/14 treated medically.  ? Carotid artery disease ([place])   ? a. s/p stenting.  ? Chronic systolic CHF (congestive heart failure) ([place])   ? Diabetes ([place])   ? Hemoptysis   ? a. 08/2014 possibly due to pulm infarct/PE.  ? Hyperlipidemia   ? Hypertension   ? Hypotension   ? Ischemic cardiomyopathy   ? a. echo 08/23/2014 EF <20%, dilated CM, mod MR/TR  ? Leukocytosis   ? Leukocytosis 09/10/2014  ?  Nephrolithiasis   ? Pleural effusion on right 08/2014 - small  ? Reactive thrombocytosis 09/10/2014  ? Right leg DVT ([place])   ? a. 08/2014.  ? Ventricular tachycardia ([place])   ? a. 06/2014 at time of MI, s/p DCCV.  ? ? ?Past Surgical History:  ?Procedure Laterality Date  ? BEDSIDE EVACUATION OF HEMATOMA  10/05/2014  ? Procedure: EVACUATION OF HEMATOMA;  Surgeon: Ivin Poot, MD;  Location: [place];  Service: Open Heart Surgery;;  ? CHOLECYSTECTOMY N/A 02/21/2017  ? Procedure: LAPAROSCOPIC CHOLECYSTECTOMY;  Surgeon: Rolm Bookbinder, MD;  Location: [place];  Service: General;  Laterality: N/A;  ? INSERTION OF IMPLANTABLE LEFT VENTRICULAR ASSIST DEVICE N/A 09/21/2014  ?  Procedure: INSERTION OF IMPLANTABLE LEFT VENTRICULAR ASSIST DEVICE;  Surgeon: Ivin Poot, MD;  Location: [place];  Service: Open Heart Surgery;  Laterality: N/A;  CIRC ARREST ? ?NITRIC OXIDE  ? LEFT HEART CATHETERIZATION WITH CORONARY ANGIOGRAM N/A 06/12/2014  ? Procedure: LEFT HEART CATHETERIZATION WITH CORONARY ANGIOGRAM;  Surgeon: Lorretta Harp, MD;  Location: [facility] CATH LAB;  Service: Cardiovascular;  Laterality: N/A;  ? RIGHT HEART CATHETERIZATION N/A 09/10/2014  ? Procedure: RIGHT HEART CATH;  Surgeon: [name], MD;  Location: [facility] CATH LAB;  Service: Cardiovascular;  Laterality: N/A;  ? RIGHT HEART CATHETERIZATION N/A 09/17/2014  ? Procedure: RIGHT HEART CATH;  Surgeon: [name], MD;  Location: [facility] CATH LAB;  Service: Cardiovascular;  Laterality: N/A;  ? TEE WITHOUT CARDIOVERSION N/A 09/21/2014  ? Procedure: TRANSESOPHAGEAL ECHOCARDIOGRAM (TEE);  Surgeon: Ivin Poot, MD;  Location: [place];  Service: Open Heart Surgery;  Laterality: N/A;  ? TEE WITHOUT CARDIOVERSION N/A 10/05/2014  ? Procedure: TRANSESOPHAGEAL ECHOCARDIOGRAM (TEE);  Surgeon: Ivin Poot, MD;  Location: [place];  Service: Open Heart Surgery;  Laterality: N/A;  ? VIDEO ASSISTED THORACOSCOPY (VATS)/THOROCOTOMY Left 10/06/2014  ? Procedure: VIDEO ASSISTED THORACOSCOPY (VATS)/THOROCOTOMY;  Surgeon: Ivin Poot, MD;  Location: [place];  Service: Open Heart Surgery;  Laterality: Left;  ?  ? ?Home Medications:  ?Prior to Admission medications   ?Medication Sig Start Date End Date Taking? Authorizing Provider  ?acetaminophen (TYLENOL) 500 MG tablet Take 1,000 mg by mouth every 6 (six) hours as needed for moderate pain.    Yes [provider]  ?amiodarone (PACERONE) 200 MG tablet Take 2 tablets (400 mg total) by mouth 2 (two) times daily. 11/03/21  Yes Joette Catching, PA-C  ?aspirin EC 81 MG tablet Take 1 tablet (81 mg total) by mouth daily. 11/12/16  Yes Clegg, Amy D, NP  ?cephALEXin (KEFLEX) 500 MG capsule Take 1  capsule (500 mg total) by mouth 2 (two) times daily. 12/06/20  Yes Bensimhon, Shaune Pascal, MD  ?clobetasol ointment (TEMOVATE) 2.99 % Apply 1 application topically 2 (two) times daily. 03/15/21  Yes Bensimhon, Shaune Pascal, MD  ?clopidogrel (PLAVIX) 75 MG tablet Take 1 tablet (75 mg total) by mouth daily. 09/14/21  Yes Bensimhon, Shaune Pascal, MD  ?cyclobenzaprine (FLEXERIL) 5 MG tablet Take 1 tablet (5 mg total) by mouth 3 (three) times daily as needed for muscle spasms. 06/30/20  Yes Bensimhon, Shaune Pascal, MD  ?furosemide (LASIX) 20 MG tablet Take 1 tablet (20 mg total) by mouth daily. Take 20 mg daily; may take 40 mg if weight gain. HOLD AS OF 10/27/21 ?Patient taking differently: Take 20-40 mg by mouth daily as needed for fluid. 20 mg daily, may take 40 mg if needed for fluid buildup 10/27/21  Yes Bensimhon, Shaune Pascal,  MD  ?pantoprazole (PROTONIX) 40 MG tablet Take 1 tablet (40 mg total) by mouth at bedtime. ?Patient taking differently: Take 40 mg by mouth daily. 06/14/21  Yes Bensimhon, Shaune Pascal, MD  ?rosuvastatin (CRESTOR) 10 MG tablet Take 10 mg by mouth every Monday, Wednesday, and Friday.   Yes [provider]  ?traMADol (ULTRAM) 50 MG tablet Take 2 tablets (100 mg total) by mouth every 6 (six) hours as needed for moderate pain. ?Patient taking differently: Take 50 mg by mouth daily as needed for moderate pain. 08/30/20  Yes Bensimhon, Shaune Pascal, MD  ?warfarin (JANTOVEN) 6 MG tablet TAKE 1 TABLET DAILY ?Patient taking differently: Take 6 mg by mouth every evening. 11/03/21  Yes Joette Catching, PA-C  ?potassium chloride SA (KLOR-CON) 20 MEQ tablet Every other day alternate 20 mEq (1 tablet) with 40 mEq (2 tablets) ?Patient taking differently: 20-40 mEq See admin instructions. 20 mEq daily WITH furosemide (take 40 mEq if taking 40 mg of furosemide) 03/15/21   Bensimhon, Shaune Pascal, MD  ?traZODone (DESYREL) 100 MG tablet Take 2 tablets (200 mg total) by mouth at bedtime. ?Patient not taking: Reported on 11/10/2021 09/14/21    Bensimhon, Shaune Pascal, MD  ? ? ?Inpatient Medications: ?Scheduled Meds: ? sodium chloride   Intravenous Once  ? aspirin EC  81 mg Oral Daily  ? cephALEXin  500 mg Oral BID  ? etomidate  10 mg Intravenous Once  ? midazolam

## 2021-11-10 NOTE — Progress Notes (Signed)
?  Transition of Care (TOC) Screening Note ? ? ?Patient Details  ?Name: Scott [name] ?Date of Birth: 29-Apr-1946 ? ? ?Transition of Care (TOC) CM/SW Contact:    ?Ravi Tuccillo, LCSW ?Phone Number: ?11/10/2021, 3:40 PM ? ? ? ?Transition of Care Department [facility]) has reviewed patient and no TOC needs have been identified at this time. We will continue to monitor patient advancement through interdisciplinary progression rounds. Patient will benefit from PT/OT consult for disposition recommendations. If new patient transition needs arise, please place a TOC consult. ?  ?

## 2021-11-10 NOTE — H&P (Addendum)
?    ?Advanced Heart Failure VAD History and Physical Note  ? ?PCP-Cardiologist: None  ? ?Reason for Admission: VT ? ?HPI:   ?Scott [name] is a 76 y/o male with h/o CAD with ankylosing spondylitis, DM2, carotid stenting, severe ischemic CM s/p anterior STEM with VT arrest in 12/15, large PE with pulmonary infarct in 2/15 s/p IVC filter and HMII LVAD.  ? ?Admitted 10/30/21 with VT. Underwent emergent DC-CV with restoration SR. Loaded on amiodarone. Developed lactic acidosis and cardiogenic shock as a result of VT. Required inotrope support with milrinone which was successfully weaned. Diuresed with IV lasix. Had AKI with recovery in renal function prior to discharge. Discharged on amio 400 mg twice a dat.  ? ?Returned to [place] clinic today. Back in VT with rates 200s. Taken to the ED. Given IV fluids. Underwent shock x1--> bradycardic. Started on dopamine and given atropine x1.  ? ?INR 8.1 K 5.8 Creatinine 1.7 ? ?LVAD INTERROGATION:  ?HeartMate II LVAD:  Flow 4.3  liters/min, speed 9200, power 5.1, PI 4.7  Multiple low flows.  ? ? ?Review of Systems: [y] = yes, '[ ]'$  = no  ? ?General: Weight gain '[ ]'$ ; Weight loss '[ ]'$ ; Anorexia '[ ]'$ ; Fatigue [ Y]; Fever '[ ]'$ ; Chills '[ ]'$ ; Weakness [Y ]  ?Cardiac: Chest pain/pressure '[ ]'$ ; Resting SOB '[ ]'$ ; Exertional SOB '[ ]'$ ; Orthopnea '[ ]'$ ; Pedal Edema '[ ]'$ ; Palpitations '[ ]'$ ; Syncope '[ ]'$ ; Presyncope '[ ]'$ ; Paroxysmal nocturnal dyspnea'[ ]'$   ?Pulmonary: Cough '[ ]'$ ; Wheezing'[ ]'$ ; Hemoptysis'[ ]'$ ; Sputum '[ ]'$ ; Snoring '[ ]'$   ?GI: Vomiting'[ ]'$ ; Dysphagia'[ ]'$ ; Melena'[ ]'$ ; Hematochezia '[ ]'$ ; Heartburn'[ ]'$ ; Abdominal pain '[ ]'$ ; Constipation '[ ]'$ ; Diarrhea '[ ]'$ ; BRBPR '[ ]'$   ?GU: Hematuria'[ ]'$ ; Dysuria '[ ]'$ ; Nocturia'[ ]'$   ?Vascular: Pain in legs with walking '[ ]'$ ; Pain in feet with lying flat '[ ]'$ ; Non-healing sores '[ ]'$ ; Stroke '[ ]'$ ; TIA '[ ]'$ ; Slurred speech '[ ]'$ ;  ?Neuro: Headaches'[ ]'$ ; Vertigo'[ ]'$ ; Seizures'[ ]'$ ; Paresthesias'[ ]'$ ;Blurred vision '[ ]'$ ; Diplopia '[ ]'$ ; Vision changes '[ ]'$   ?Ortho/Skin: Arthritis '[ ]'$ ; Joint pain '[ ]'$ Y; Muscle pain '[ ]'$ ;  Joint swelling '[ ]'$ ; Back Pain [ Y]; Rash '[ ]'$   ?Psych: Depression'[ ]'$ ; Anxiety'[ ]'$   ?Heme: Bleeding problems '[ ]'$ ; Clotting disorders '[ ]'$ ; Anemia '[ ]'$   ?Endocrine: Diabetes '[ ]'$ ; Thyroid dysfunction'[ ]'$  ?  ? ?Home Medications ?Prior to Admission medications   ?Medication Sig Start Date End Date Taking? Authorizing Provider  ?acetaminophen (TYLENOL) 500 MG tablet Take 1,000 mg by mouth every 6 (six) hours as needed for moderate pain.     [provider]  ?amiodarone (PACERONE) 200 MG tablet Take 2 tablets (400 mg total) by mouth 2 (two) times daily. 11/03/21   Joette Catching, PA-C  ?aspirin EC 81 MG tablet Take 1 tablet (81 mg total) by mouth daily. 11/12/16   Clegg, Amy D, NP  ?cephALEXin (KEFLEX) 500 MG capsule Take 1 capsule (500 mg total) by mouth 2 (two) times daily. 12/06/20   Burk Hoctor, Shaune Pascal, MD  ?clobetasol ointment (TEMOVATE) 5.17 % Apply 1 application topically 2 (two) times daily. 03/15/21   Monzerrat Wellen, Shaune Pascal, MD  ?clopidogrel (PLAVIX) 75 MG tablet Take 1 tablet (75 mg total) by mouth daily. 09/14/21   Jakel Alphin, Shaune Pascal, MD  ?cyclobenzaprine (FLEXERIL) 5 MG tablet Take 1 tablet (5 mg total) by mouth 3 (three) times daily as needed for muscle spasms. 06/30/20   Jocelynne Duquette, Shaune Pascal,  MD  ?furosemide (LASIX) 20 MG tablet Take 1 tablet (20 mg total) by mouth daily. Take 20 mg daily; may take 40 mg if weight gain. HOLD AS OF 10/27/21 10/27/21   Banessa Mao, Shaune Pascal, MD  ?pantoprazole (PROTONIX) 40 MG tablet Take 1 tablet (40 mg total) by mouth at bedtime. ?Patient taking differently: Take 40 mg by mouth daily. 06/14/21   Elic Vencill, Shaune Pascal, MD  ?potassium chloride SA (KLOR-CON) 20 MEQ tablet Every other day alternate 20 mEq (1 tablet) with 40 mEq (2 tablets) ?Patient taking differently: 20 mEq daily. Every other day alternate 20 mEq (1 tablet) with 40 mEq (2 tablets) 03/15/21   Edrei Norgaard, Shaune Pascal, MD  ?rosuvastatin (CRESTOR) 10 MG tablet Take 10 mg by mouth 3 (three) times a week.    [provider]  ?traMADol (ULTRAM) 50 MG tablet Take 2 tablets (100 mg total) by mouth every 6 (six) hours as needed for moderate pain. 08/30/20   Lamarr Feenstra, Shaune Pascal, MD  ?traZODone (DESYREL) 100 MG tablet Take 2 tablets (200 mg total) by mouth at bedtime. 09/14/21   Loyce Klasen, Shaune Pascal, MD  ?warfarin (JANTOVEN) 6 MG tablet TAKE 1 TABLET DAILY 11/03/21   Joette Catching, PA-C  ? ? ?Past Medical History: ?Past Medical History:  ?Diagnosis Date  ? Acute on chronic respiratory failure ([place])   ? a. 08/2014 in setting of PE.  ? Ankylosing spondylitis ([place])   ? Bilateral pulmonary embolism ([facility])   ? a. 08/2014 - started on Coumadin. Retrievable IVC filter placed 08/27/14 due to RV strain and large clot burden.  ? CAD (coronary artery disease)   ? a. stenting of LCx 2013; b. STEMI 06/12/14 s/p PCI to LAD complicated by post cath shock requiring IABP; VT s/p DCCV, EF 20%; c. NSTEMI 06/26/14 treated medically.  ? Carotid artery disease ([place])   ? a. s/p stenting.  ? Chronic systolic CHF (congestive heart failure) ([place])   ? Diabetes ([place])   ? Hemoptysis   ? a. 08/2014 possibly due to pulm infarct/PE.  ? Hyperlipidemia   ? Hypertension   ? Hypotension   ? Ischemic cardiomyopathy   ? a. echo 08/23/2014 EF <20%, dilated CM, mod MR/TR  ? Leukocytosis   ? Leukocytosis 09/10/2014  ? Nephrolithiasis   ? Pleural effusion on right 08/2014 - small  ? Reactive thrombocytosis 09/10/2014  ? Right leg DVT ([place])   ? a. 08/2014.  ? Ventricular tachycardia ([place])   ? a. 06/2014 at time of MI, s/p DCCV.  ? ? ?Past Surgical History: ?Past Surgical History:  ?Procedure Laterality Date  ? BEDSIDE EVACUATION OF HEMATOMA  10/05/2014  ? Procedure: EVACUATION OF HEMATOMA;  Surgeon: Ivin Poot, MD;  Location: [place];  Service: Open Heart Surgery;;  ? CHOLECYSTECTOMY N/A 02/21/2017  ? Procedure: LAPAROSCOPIC CHOLECYSTECTOMY;  Surgeon: Rolm Bookbinder, MD;  Location: [place];  Service: General;  Laterality: N/A;  ? INSERTION OF IMPLANTABLE LEFT VENTRICULAR  ASSIST DEVICE N/A 09/21/2014  ? Procedure: INSERTION OF IMPLANTABLE LEFT VENTRICULAR ASSIST DEVICE;  Surgeon: Ivin Poot, MD;  Location: [place];  Service: Open Heart Surgery;  Laterality: N/A;  CIRC ARREST ? ?NITRIC OXIDE  ? LEFT HEART CATHETERIZATION WITH CORONARY ANGIOGRAM N/A 06/12/2014  ? Procedure: LEFT HEART CATHETERIZATION WITH CORONARY ANGIOGRAM;  Surgeon: Lorretta Harp, MD;  Location[facility] CATH LAB;  Service: Cardiovascular;  Laterality: N/A;  ? RIGHT HEART CATHETERIZATION N/A 09/10/2014  ? Procedure: RIGHT HEART CATH;  Surgeon: [name], MD;  Location: [place] CATH LAB;  Service: Cardiovascular;  Laterality: N/A;  ? RIGHT HEART CATHETERIZATION N/A 09/17/2014  ? Procedure: RIGHT HEART CATH;  Surgeon: [name], MD;  Location: [facility] CATH LAB;  Service: Cardiovascular;  Laterality: N/A;  ? TEE WITHOUT CARDIOVERSION N/A 09/21/2014  ? Procedure: TRANSESOPHAGEAL ECHOCARDIOGRAM (TEE);  Surgeon: Ivin Poot, MD;  Location: [place];  Service: Open Heart Surgery;  Laterality: N/A;  ? TEE WITHOUT CARDIOVERSION N/A 10/05/2014  ? Procedure: TRANSESOPHAGEAL ECHOCARDIOGRAM (TEE);  Surgeon: Ivin Poot, MD;  Location: [place];  Service: Open Heart Surgery;  Laterality: N/A;  ? VIDEO ASSISTED THORACOSCOPY (VATS)/THOROCOTOMY Left 10/06/2014  ? Procedure: VIDEO ASSISTED THORACOSCOPY (VATS)/THOROCOTOMY;  Surgeon: Ivin Poot, MD;  Location: [place];  Service: Open Heart Surgery;  Laterality: Left;  ? ? ?Family History: ?Family History  ?Problem Relation Age of Onset  ? Hypertension Father   ? Heart attack Father   ? Heart Problems Sister   ? ? ?Social History: ?Social History  ? ?Socioeconomic History  ? Marital status: Married  ?  Spouse name: Not on file  ? Number of children: Not on file  ? Years of education: Not on file  ? Highest education level: Not on file  ?Occupational History  ? Occupation: retired  ?Tobacco Use  ? Smoking status: Former  ?  Packs/day: 0.30  ?  Years: 0.00  ?  Pack years: 0.00  ?   Types: Cigarettes  ?  Quit date: 06/12/1969  ?  Years since quitting: 52.4  ? Smokeless tobacco: Never  ? Tobacco comments:  ?  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  ?Vaping Use  ? Vaping Use: Former  ?Su

## 2021-11-10 NOTE — ED Notes (Signed)
Cardiologist & EDP Gilford Raid, this RN NT & pharmacist at bedside. LVAD cart/batteries with Pt & cardiac monitor showing is V-fib. 20 g PIV with blood return is in [facility]. LR fluids hanging now. Team is ready to cardiovert once RT arrives. ?

## 2021-11-10 NOTE — Sedation Documentation (Addendum)
RT arrived ?

## 2021-11-10 NOTE — Sedation Documentation (Signed)
Pt shocked 200J by cardiologist.  ?

## 2021-11-10 NOTE — ED Triage Notes (Signed)
Pt was brought in from [place] clinic for & was in V-Fib that required cardioversion. A/Ox4 upon arrival, wife with him plus LVAD coordinator. ?

## 2021-11-10 NOTE — Progress Notes (Signed)
?  11/10/2021 at 1600  ? ?Contacted LVAD coordinator Cloyde Reams, RN to ask about bedside LVAD wall monitor intermittently displaying dashes in place of pump flow.  It was explained that dashes can appear when there is an interruption in flow.  Benismhon, MD was notified. No new orders at this time. ? ? ?- Tawana Scale, RN. ?

## 2021-11-10 NOTE — ED Provider Notes (Signed)
?[name] ?Provider Note ? ? ?CSN: [id number] ?Arrival date & time: 11/10/21  0815 ? ?  ? ?History ? ?Chief Complaint  ?Patient presents with  ? V-Fib from LVAD clinic  ? ? ?Scott [name] is a 76 y.o. male. ? ?Pt is a 76 yo male with a pmhx significant for hyperlipidemia, htn, dm, ankylosing spondylitis, CAD, ischemic CM, bilateral PE, and CHF.  He is a LVAD patient and started feeling bad this am.  Pt was in the hospital last week for VT and was d/c on 5/5.  PT went to the LVAD clinic this am and was sent over here as he was in VT.  Upon arrival, pt was met by myself and the LVAD team. ? ? ?  ? ?Home Medications ?Prior to Admission medications   ?Medication Sig Start Date End Date Taking? Authorizing Provider  ?acetaminophen (TYLENOL) 500 MG tablet Take 1,000 mg by mouth every 6 (six) hours as needed for moderate pain.    Yes [provider]  ?amiodarone (PACERONE) 200 MG tablet Take 2 tablets (400 mg total) by mouth 2 (two) times daily. 11/03/21  Yes Joette Catching, PA-C  ?aspirin EC 81 MG tablet Take 1 tablet (81 mg total) by mouth daily. 11/12/16  Yes Clegg, Amy D, NP  ?cephALEXin (KEFLEX) 500 MG capsule Take 1 capsule (500 mg total) by mouth 2 (two) times daily. 12/06/20  Yes Bensimhon, Shaune Pascal, MD  ?clobetasol ointment (TEMOVATE) 1.60 % Apply 1 application topically 2 (two) times daily. 03/15/21  Yes Bensimhon, Shaune Pascal, MD  ?clopidogrel (PLAVIX) 75 MG tablet Take 1 tablet (75 mg total) by mouth daily. 09/14/21  Yes Bensimhon, Shaune Pascal, MD  ?cyclobenzaprine (FLEXERIL) 5 MG tablet Take 1 tablet (5 mg total) by mouth 3 (three) times daily as needed for muscle spasms. 06/30/20  Yes Bensimhon, Shaune Pascal, MD  ?furosemide (LASIX) 20 MG tablet Take 1 tablet (20 mg total) by mouth daily. Take 20 mg daily; may take 40 mg if weight gain. HOLD AS OF 10/27/21 ?Patient taking differently: Take 20-40 mg by mouth daily as needed for fluid. 20 mg daily, may take 40 mg if needed for fluid  buildup 10/27/21  Yes Bensimhon, Shaune Pascal, MD  ?pantoprazole (PROTONIX) 40 MG tablet Take 1 tablet (40 mg total) by mouth at bedtime. ?Patient taking differently: Take 40 mg by mouth daily. 06/14/21  Yes Bensimhon, Shaune Pascal, MD  ?rosuvastatin (CRESTOR) 10 MG tablet Take 10 mg by mouth every Monday, Wednesday, and Friday.   Yes [provider]  ?traMADol (ULTRAM) 50 MG tablet Take 2 tablets (100 mg total) by mouth every 6 (six) hours as needed for moderate pain. ?Patient taking differently: Take 50 mg by mouth daily as needed for moderate pain. 08/30/20  Yes Bensimhon, Shaune Pascal, MD  ?warfarin (JANTOVEN) 6 MG tablet TAKE 1 TABLET DAILY ?Patient taking differently: Take 6 mg by mouth every evening. 11/03/21  Yes Joette Catching, PA-C  ?potassium chloride SA (KLOR-CON) 20 MEQ tablet Every other day alternate 20 mEq (1 tablet) with 40 mEq (2 tablets) ?Patient taking differently: 20-40 mEq See admin instructions. 20 mEq daily WITH furosemide (take 40 mEq if taking 40 mg of furosemide) 03/15/21   Bensimhon, Shaune Pascal, MD  ?traZODone (DESYREL) 100 MG tablet Take 2 tablets (200 mg total) by mouth at bedtime. ?Patient not taking: Reported on 11/10/2021 09/14/21   Bensimhon, Shaune Pascal, MD  ?   ? ?Allergies    ?Desyrel [  trazodone], Other, and Biopatch [chlorhexidine]   ? ?Review of Systems   ?Review of Systems  ?Neurological:  Positive for weakness.  ?All other systems reviewed and are negative. ? ?Physical Exam ?Updated Vital Signs ?BP 97/84 (BP Location: Right Arm)   Pulse (!) 40   Temp (!) 97.5 ?F (36.4 ?C) (Axillary)   Resp 19   SpO2 92%  ?Physical Exam ?Vitals and nursing note reviewed.  ?Constitutional:   ?   General: He is in acute distress.  ?   Appearance: He is ill-appearing.  ?HENT:  ?   Head: Normocephalic and atraumatic.  ?   Right Ear: External ear normal.  ?   Left Ear: External ear normal.  ?   Nose: Nose normal.  ?   Mouth/Throat:  ?   Mouth: Mucous membranes are dry.  ?Eyes:  ?   Extraocular  Movements: Extraocular movements intact.  ?   Pupils: Pupils are equal, round, and reactive to light.  ?Cardiovascular:  ?   Rate and Rhythm: Tachycardia present.  ?   Comments: LVAD hum ?Pulmonary:  ?   Effort: Pulmonary effort is normal.  ?   Breath sounds: Normal breath sounds.  ?Abdominal:  ?   General: Abdomen is flat. Bowel sounds are normal.  ?   Palpations: Abdomen is soft.  ?Musculoskeletal:     ?   General: Normal range of motion.  ?   Cervical back: Normal range of motion and neck supple.  ?Skin: ?   General: Skin is warm.  ?   Capillary Refill: Capillary refill takes less than 2 seconds.  ?Neurological:  ?   General: No focal deficit present.  ?   Mental Status: He is alert and oriented to person, place, and time.  ?Psychiatric:     ?   Mood and Affect: Mood normal.     ?   Behavior: Behavior normal.  ? ? ?ED Results / Procedures / Treatments   ?Labs ?(all labs ordered are listed, but only abnormal results are displayed) ?Labs Reviewed  ?CBC WITH DIFFERENTIAL/PLATELET - Abnormal; Notable for the following components:  ?    Result Value  ? WBC 12.7 (*)   ? RDW 20.0 (*)   ? Neutro Abs 11.0 (*)   ? Lymphs Abs 0.4 (*)   ? Monocytes Absolute 1.2 (*)   ? Abs Immature Granulocytes 0.08 (*)   ? All other components within normal limits  ?COMPREHENSIVE METABOLIC PANEL - Abnormal; Notable for the following components:  ? Potassium 5.8 (*)   ? Glucose, Bld 133 (*)   ? BUN 32 (*)   ? Creatinine, Ser 1.69 (*)   ? AST 53 (*)   ? ALT 108 (*)   ? Alkaline Phosphatase 140 (*)   ? Total Bilirubin 2.2 (*)   ? GFR, Estimated 42 (*)   ? All other components within normal limits  ?LACTATE DEHYDROGENASE - Abnormal; Notable for the following components:  ? LDH 437 (*)   ? All other components within normal limits  ?PROTIME-INR - Abnormal; Notable for the following components:  ? Prothrombin Time 67.5 (*)   ? INR 8.2 (*)   ? All other components within normal limits  ?I-STAT CHEM 8, ED - Abnormal; Notable for the following  components:  ? Potassium 8.4 (*)   ? BUN 59 (*)   ? Creatinine, Ser 1.70 (*)   ? Glucose, Bld 142 (*)   ? Calcium, Ion 0.94 (*)   ? Hemoglobin 18.7 (*)   ?  HCT 55.0 (*)   ? All other components within normal limits  ?I-STAT ARTERIAL BLOOD GAS, ED - Abnormal; Notable for the following components:  ? pO2, Arterial 268 (*)   ? Acid-base deficit 3.0 (*)   ? All other components within normal limits  ?MRSA NEXT GEN BY PCR, NASAL  ?MAGNESIUM  ?GLUCOSE, CAPILLARY  ?LACTIC ACID, PLASMA  ?LACTIC ACID, PLASMA  ?PREPARE FRESH FROZEN PLASMA  ? ? ?EKG ?EKG Interpretation ? ?Date/Time:  Friday Nov 10 2021 08:35:03 EDT ?Ventricular Rate:  37 ?PR Interval:  64 ?QRS Duration: 155 ?QT Interval:  484 ?QTC Calculation: 380 ?R Axis:   268 ?Text Interpretation: 2nd hb vs complete after cardioversion Confirmed by Isla Pence [phone]) on 11/10/2021 12:02:18 PM ? ?Radiology ?No results found. ? ?Procedures ?.Sedation ? ?Date/Time: 11/10/2021 12:07 PM ?Performed by: Isla Pence, MD ?Authorized by: Isla Pence, MD  ? ?Consent:  ?  Consent obtained:  Verbal ?  Consent given by:  Patient ?  Risks discussed:  Respiratory compromise necessitating ventilatory assistance and intubation ?Universal protocol:  ?  Immediately prior to procedure, a time out was called: yes   ?  Patient identity confirmed:  Verbally with patient ?Indications:  ?  Procedure performed:  Cardioversion ?Pre-sedation assessment:  ?  Time since last food or drink:  5 ?  ASA classification: class 4 - patient with severe systemic disease that is a constant threat to life   ?  Mouth opening:  2 finger widths ?  Mallampati score:  III - soft palate, base of uvula visible ?  Neck mobility: reduced   ?  Pre-sedation assessments completed and reviewed: airway patency, cardiovascular function, hydration status, mental status, nausea/vomiting, pain level, respiratory function and temperature   ?Immediate pre-procedure details:  ?  Reassessment: Patient reassessed immediately  prior to procedure   ?  Verified: bag valve mask available, emergency equipment available, intubation equipment available, IV patency confirmed and oxygen available   ?Procedure details (see MAR for exact dosages):  ?  P

## 2021-11-10 NOTE — CV Procedure (Signed)
? ?   DIRECT CURRENT CARDIOVERSION ? ?NAME:  Scott [name]   MRN: [id number] ?DOB:  01/19/1946   ADMIT DATE: 11/10/2021 ? ? ?INDICATIONS: VT/VF ? ? ?PROCEDURE:  ? ?Informed consent was obtained prior to the procedure. The risks, benefits and alternatives for the procedure were discussed and the patient comprehended these risks. Once an appropriate time out was taken, the patient had the defibrillator pads placed in the anterior and posterior position. The patient then underwent sedation by Dr. Gilford Raid. Once an appropriate level of sedation was achieved, the patient received a single biphasic, synchronized 200J shock with prompt conversion to CHB with junctional escape rhythm  No apparent complications. ? ?[name], MD  ?2:49 PM ? ?

## 2021-11-10 NOTE — Progress Notes (Signed)
ANTICOAGULATION CONSULT NOTE - Initial Consult ? ?Pharmacy Consult for heparin ?Indication:  LVAD ? ?Allergies  ?Allergen Reactions  ? Desyrel [Trazodone] Other (See Comments)  ?  insomnia  ? Other Dermatitis  ?  Adhesive  ? Biopatch [Chlorhexidine] Dermatitis and Rash  ? ? ?Patient Measurements: ?  ?Heparin Dosing Weight: 84kg ? ?Vital Signs: ?Temp: 98.2 ?F (36.8 ?C) (05/12 1544) ?Temp Source: Oral (05/12 1544) ?BP: 111/88 (05/12 1535) ?Pulse Rate: 36 (05/12 1544) ? ?Labs: ?Recent Labs  ?  11/10/21 ?1610 11/10/21 ?9604 11/10/21 ?5409 11/10/21 ?1001  ?HGB 16.2 18.7* 15.0 13.6  ?HCT 54.0* 55.0* 44.0 43.6  ?PLT 356  --   --  340  ?LABPROT  --   --   --  67.5*  ?INR  --   --   --  8.2*  ?CREATININE  --  1.70*  --  1.69*  ? ? ?Estimated Creatinine Clearance: 40.6 mL/min (A) (by C-G formula based on SCr of 1.69 mg/dL (H)). ? ? ?Medical History: ?Past Medical History:  ?Diagnosis Date  ? Acute on chronic respiratory failure ([place])   ? a. 08/2014 in setting of PE.  ? Ankylosing spondylitis ([place])   ? Bilateral pulmonary embolism ([facility])   ? a. 08/2014 - started on Coumadin. Retrievable IVC filter placed 08/27/14 due to RV strain and large clot burden.  ? CAD (coronary artery disease)   ? a. stenting of LCx 2013; b. STEMI 06/12/14 s/p PCI to LAD complicated by post cath shock requiring IABP; VT s/p DCCV, EF 20%; c. NSTEMI 06/26/14 treated medically.  ? Carotid artery disease ([place])   ? a. s/p stenting.  ? Chronic systolic CHF (congestive heart failure) ([place])   ? Diabetes ([place])   ? Hemoptysis   ? a. 08/2014 possibly due to pulm infarct/PE.  ? Hyperlipidemia   ? Hypertension   ? Hypotension   ? Ischemic cardiomyopathy   ? a. echo 08/23/2014 EF <20%, dilated CM, mod MR/TR  ? Leukocytosis   ? Leukocytosis 09/10/2014  ? Nephrolithiasis   ? Pleural effusion on right 08/2014 - small  ? Reactive thrombocytosis 09/10/2014  ? Right leg DVT ([place])   ? a. 08/2014.  ? Ventricular tachycardia ([place])   ? a. 06/2014 at time of MI, s/p DCCV.   ? ?Assessment: ?46 yoM with HM2 LVAD admitted with VT. Pt is onw arfarin PTA with INR >8 on admit. CBC and LDH stable. Pharmacy to start heparin once INR <1.8. ? ? ?Plan:  ?Hold warfarin ?Heparin once INr <1.8 ? ?Arrie Senate, PharmD, BCPS, BCCP ?Clinical Pharmacist ?[phone] ?Please check AMION for all [place] numbers ?11/10/2021 ? ? ? ?

## 2021-11-10 NOTE — Sedation Documentation (Addendum)
Pt placed on CPAP

## 2021-11-11 DIAGNOSIS — I472 Ventricular tachycardia, unspecified: Secondary | ICD-10-CM | POA: Diagnosis not present

## 2021-11-11 DIAGNOSIS — I5022 Chronic systolic (congestive) heart failure: Secondary | ICD-10-CM | POA: Diagnosis not present

## 2021-11-11 DIAGNOSIS — Z95811 Presence of heart assist device: Secondary | ICD-10-CM | POA: Diagnosis not present

## 2021-11-11 LAB — BASIC METABOLIC PANEL
Anion gap: 6 (ref 5–15)
BUN: 25 mg/dL — ABNORMAL HIGH (ref 8–23)
CO2: 24 mmol/L (ref 22–32)
Calcium: 8.7 mg/dL — ABNORMAL LOW (ref 8.9–10.3)
Chloride: 106 mmol/L (ref 98–111)
Creatinine, Ser: 1.14 mg/dL (ref 0.61–1.24)
GFR, Estimated: >60 mL/min (ref >60)
Glucose, Bld: 116 mg/dL — ABNORMAL HIGH (ref 70–99)
Potassium: 4.4 mmol/L (ref 3.5–5.1)
Sodium: 136 mmol/L (ref 135–145)

## 2021-11-11 LAB — CBC
HCT: 41.4 % (ref 39.0–52.0)
Hemoglobin: 13.2 g/dL (ref 13.0–17.0)
MCH: 29.9 pg (ref 26.0–34.0)
MCHC: 31.9 g/dL (ref 30.0–36.0)
MCV: 93.9 fL (ref 80.0–100.0)
Platelets: 327 10*3/uL (ref 150–400)
RBC: 4.41 MIL/uL (ref 4.22–5.81)
RDW: 19.6 % — ABNORMAL HIGH (ref 11.5–15.5)
WBC: 13.3 10*3/uL — ABNORMAL HIGH (ref 4.0–10.5)
nRBC: 0 % (ref 0.0–0.2)

## 2021-11-11 LAB — BPAM FFP
Blood Product Expiration Date: 202305132359
ISSUE DATE / TIME: 202305121155
Unit Type and Rh: 6200

## 2021-11-11 LAB — LACTATE DEHYDROGENASE: LDH: 395 U/L — ABNORMAL HIGH (ref 98–192)

## 2021-11-11 LAB — PROTIME-INR
INR: 2.6 — ABNORMAL HIGH (ref 0.8–1.2)
Prothrombin Time: 27.6 seconds — ABNORMAL HIGH (ref 11.4–15.2)

## 2021-11-11 LAB — PREPARE FRESH FROZEN PLASMA

## 2021-11-11 NOTE — Progress Notes (Addendum)
ANTICOAGULATION CONSULT NOTE ? ?Pharmacy Consult for heparin ?Indication:  LVAD ? ?Allergies  ?Allergen Reactions  ? Desyrel [Trazodone] Other (See Comments)  ?  insomnia  ? Other Dermatitis  ?  Adhesive  ? Biopatch [Chlorhexidine] Dermatitis and Rash  ? ? ?Patient Measurements: ?Weight: 88.9 kg (195 lb 15.8 oz) ?Heparin Dosing Weight: 84kg ? ?Vital Signs: ?Temp: 97.8 ?F (36.6 ?C) (05/13 0730) ?Temp Source: Oral (05/13 0730) ?BP: 96/81 (05/13 0600) ?Pulse Rate: 45 (05/13 0800) ? ?Labs: ?Recent Labs  ?  11/10/21 ?2671 11/10/21 ?2458 11/10/21 ?0998 11/10/21 ?1001 11/10/21 ?1608 11/11/21 ?0305  ?HGB 16.2   < > 15.0 13.6  --  13.2  ?HCT 54.0*   < > 44.0 43.6  --  41.4  ?PLT 356  --   --  340  --  327  ?LABPROT  --   --   --  67.5* 39.6* 27.6*  ?INR  --   --   --  8.2* 4.1* 2.6*  ?CREATININE  --    < >  --  1.69* 1.41* 1.14  ? < > = values in this interval not displayed.  ? ? ? ?Estimated Creatinine Clearance: 61.6 mL/min (by C-G formula based on SCr of 1.14 mg/dL). ? ? ?Medical History: ?Past Medical History:  ?Diagnosis Date  ? Acute on chronic respiratory failure ([place])   ? a. 08/2014 in setting of PE.  ? Ankylosing spondylitis ([place])   ? Bilateral pulmonary embolism ([facility])   ? a. 08/2014 - started on Coumadin. Retrievable IVC filter placed 08/27/14 due to RV strain and large clot burden.  ? CAD (coronary artery disease)   ? a. stenting of LCx 2013; b. STEMI 06/12/14 s/p PCI to LAD complicated by post cath shock requiring IABP; VT s/p DCCV, EF 20%; c. NSTEMI 06/26/14 treated medically.  ? Carotid artery disease ([place])   ? a. s/p stenting.  ? Chronic systolic CHF (congestive heart failure) ([place])   ? Diabetes ([place])   ? Hemoptysis   ? a. 08/2014 possibly due to pulm infarct/PE.  ? Hyperlipidemia   ? Hypertension   ? Hypotension   ? Ischemic cardiomyopathy   ? a. echo 08/23/2014 EF <20%, dilated CM, mod MR/TR  ? Leukocytosis   ? Leukocytosis 09/10/2014  ? Nephrolithiasis   ? Pleural effusion on right 08/2014 - small  ? Reactive  thrombocytosis 09/10/2014  ? Right leg DVT ([place])   ? a. 08/2014.  ? Ventricular tachycardia ([place])   ? a. 06/2014 at time of MI, s/p DCCV.  ? ?Assessment: ?61 yoM with HM2 LVAD admitted with VT. Pt is onw arfarin PTA with INR >8 on admit. CBC stable, LDH trending down. Pharmacy to start heparin once INR <1.8. ? ?INR 4.1 > 2.6 ? ?Plan:  ?Hold warfarin ?Heparin once INR <1.8 ? ?Laurey Arrow, PharmD ?PGY1 Pharmacy Resident ?11/11/2021  8:14 AM ? ?Please check [email or web address] for unit-specific pharmacy phone numbers. ? ?

## 2021-11-11 NOTE — Progress Notes (Signed)
Patient ID: Scott [name], male   DOB: 1945/10/06, 76 y.o.   MRN: [id number] ?  ?Advanced Heart Failure VAD Team Note ? ?PCP-Cardiologist: None  ? ?Subjective:   ? ?5/12: VT with DCCV, CHB after DCCV.  Amiodarone stopped and dopamine begun.  ? ?He is on dopamine 5 this morning, MAP 80s-90s.  In CHB with HR upper 30s-40s.   ? ?Feels better, no complaints.  No low flow alarms on LVAD.  ? ?LVAD INTERROGATION:  ?HeartMate 2 LVAD:   ?Flow 4.3 liters/min, speed 9200, power 5.1, PI 5.2.  No PI events or low flow alarms.   ? ?Objective:   ? ?Vital Signs:   ?Temp:  [97.5 ?F (36.4 ?C)-98.6 ?F (37 ?C)] 97.8 ?F (36.6 ?C) (05/13 0730) ?Pulse Rate:  [32-158] 45 (05/13 0800) ?Resp:  [13-28] 16 (05/13 0800) ?BP: (73-130)/(51-100) 96/81 (05/13 0600) ?SpO2:  [76 %-99 %] 98 % (05/13 0800) ?Weight:  [88.9 kg] 88.9 kg (05/13 0620) ?Last BM Date : 11/10/21 ?Mean arterial Pressure 80s-90s ? ?Intake/Output:  ? ?Intake/Output Summary (Last 24 hours) at 11/11/2021 0847 ?Last data filed at 11/11/2021 0800 ?Gross per 24 hour  ?Intake 908.82 ml  ?Output 450 ml  ?Net 458.82 ml  ?  ? ?Physical Exam  ?  ?General:  Well appearing. No resp difficulty ?HEENT: normal ?Neck: supple. JVP 8. Carotids 2+ bilat; no bruits. No lymphadenopathy or thyromegaly appreciated. ?Cor: Mechanical heart sounds with LVAD hum present. ?Lungs: clear ?Abdomen: soft, nontender, nondistended. No hepatosplenomegaly. No bruits or masses. Good bowel sounds. ?Driveline: C/D/I; securement device intact and driveline incorporated ?Extremities: no cyanosis, clubbing, rash, edema ?Neuro: alert & orientedx3, cranial nerves grossly intact. moves all 4 extremities w/o difficulty. Affect pleasant ? ? ?Telemetry  ? ?CHB upper 30s-40s, narrow complex escape.  Personally reviewed ? ?Labs  ? ?Basic Metabolic Panel: ?Recent Labs  ?Lab 11/10/21 ?0836 11/10/21 ?0903 11/10/21 ?1001 11/10/21 ?1608 11/11/21 ?0305  ?NA 137 140 141 140 136  ?K 8.4* 5.1 5.8* 4.8 4.4  ?CL 108  --  108 108 106   ?CO2  --   --  '24 24 24  '$ ?GLUCOSE 142*  --  133* 115* 116*  ?BUN 59*  --  32* 29* 25*  ?CREATININE 1.70*  --  1.69* 1.41* 1.14  ?CALCIUM  --   --  8.9 9.0 8.7*  ?MG  --   --  2.1  --   --   ? ? ?Liver Function Tests: ?Recent Labs  ?Lab 11/10/21 ?1001  ?AST 53*  ?ALT 108*  ?ALKPHOS 140*  ?BILITOT 2.2*  ?PROT 7.1  ?ALBUMIN 3.5  ? ?No results for input(s): LIPASE, AMYLASE in the last 168 hours. ?No results for input(s): AMMONIA in the last 168 hours. ? ?CBC: ?Recent Labs  ?Lab 11/10/21 ?3825 11/10/21 ?0539 11/10/21 ?7673 11/10/21 ?1001 11/11/21 ?0305  ?WBC 11.7*  --   --  12.7* 13.3*  ?NEUTROABS  --   --   --  11.0*  --   ?HGB 16.2 18.7* 15.0 13.6 13.2  ?HCT 54.0* 55.0* 44.0 43.6 41.4  ?MCV 97.5  --   --  95.2 93.9  ?PLT 356  --   --  340 327  ? ? ?INR: ?Recent Labs  ?Lab 11/10/21 ?1001 11/10/21 ?1608 11/11/21 ?0305  ?INR 8.2* 4.1* 2.6*  ? ? ?Other results: ?EKG:  ? ? ?Imaging  ? ?No results found. ? ? ?Medications:   ? ? ?Scheduled Medications: ? aspirin EC  81 mg Oral Daily  ?  cephALEXin  500 mg Oral BID  ? etomidate  10 mg Intravenous Once  ? midazolam  0.5 mg Intravenous Once  ? pantoprazole  40 mg Oral Daily  ? rosuvastatin  10 mg Oral Once per day on Mon Wed Fri  ? sodium chloride flush  3 mL Intravenous Q12H  ? ? ?Infusions: ? sodium chloride    ? DOPamine 5 mcg/kg/min (11/11/21 0800)  ? ? ?PRN Medications: ?sodium chloride, acetaminophen, ondansetron (ZOFRAN) IV, sodium chloride flush ? ? ?Assessment/Plan:   ? ?1. VT: Recently admitted with VT @ 290 bpm. Cardioversion x1. Loaded on amiodarone.  ?- Presented 5/12 in recurrent VT. Urgent cardioversion 200J x1---> CHB.  ?- No further VT.  ?- EP has seen, plan for dual chamber ICD placement.  ?- On dopamine for CHB (see below).  Will wean down slowly to make sure HR/MAP tolerate.  ? ?2. CHB: Post-DCCV.  He remains in CHB today, HR upper 30s-40s.  MAP stable, creatinine down to 1.14.  No complaints, no dizziness.  No low flow alarms on LVAD interrogation.  ?-  Monitor for now.  ?- Wean down dopamine slowly, drop to 4 mcg/kg/min now.    ?- Discussed with EP, will get dual chamber ICD.  ?  ?3. Chronic HFrEF with HMII LVAD, ICM.  ?-  Echo LVEF < 20% RV severely HK. Septum midline. ?- Volume status stable.  ?- no ARB/BB.  ?- Creatinine down to 1.14.  ?  ?4. LVAD HMII--> Destination Therapy Implanted 2015 ?- No further low flows/PI events post-DCCV from VT despite CHB.  ?- Follow daily LDH, INR ?- INR down to 2.6.  ?- Holding coumadin and plavix for PPM, will cover with heparin gtt when INR < 2.  ?- On keflex for chronic DL infection prophylaxis.  ?  ?5. Hyperkalemia  ?- Resolved.  ?  ?6. AKI on CKD Stage II ?- Creatinine on admit 1.7  ?- Now down to 1.14.  ?  ? ?I reviewed the LVAD parameters from today, and compared the results to the patient's prior recorded data.  No programming changes were made.  The LVAD is functioning within specified parameters.  The patient performs LVAD self-test daily.  LVAD interrogation was negative for any significant power changes, alarms or PI events/speed drops.  LVAD equipment check completed and is in good working order.  Back-up equipment present.   LVAD education done on emergency procedures and precautions and reviewed exit site care. ? ?Length of Stay: 1 ? ?Loralie Champagne, MD ?11/11/2021, 8:47 AM ? ?VAD Team --- VAD ISSUES ONLY--- ?Pager [phone] (7am - 7am) ? ?Advanced Heart Failure Team  ?Pager [phone] (M-F; 7a - 5p)  ?Please contact [facility] for night-coverage after hours (5p -7a ) and weekends on [email or web address] ? ?  ?

## 2021-11-12 DIAGNOSIS — Z95811 Presence of heart assist device: Secondary | ICD-10-CM | POA: Diagnosis not present

## 2021-11-12 DIAGNOSIS — I472 Ventricular tachycardia, unspecified: Secondary | ICD-10-CM | POA: Diagnosis not present

## 2021-11-12 DIAGNOSIS — I5022 Chronic systolic (congestive) heart failure: Secondary | ICD-10-CM | POA: Diagnosis not present

## 2021-11-12 LAB — BASIC METABOLIC PANEL
Anion gap: 6 (ref 5–15)
BUN: 21 mg/dL (ref 8–23)
CO2: 25 mmol/L (ref 22–32)
Calcium: 8.7 mg/dL — ABNORMAL LOW (ref 8.9–10.3)
Chloride: 104 mmol/L (ref 98–111)
Creatinine, Ser: 1.05 mg/dL (ref 0.61–1.24)
GFR, Estimated: >60 mL/min (ref >60)
Glucose, Bld: 128 mg/dL — ABNORMAL HIGH (ref 70–99)
Potassium: 4.6 mmol/L (ref 3.5–5.1)
Sodium: 135 mmol/L (ref 135–145)

## 2021-11-12 LAB — PROTIME-INR
INR: 2.6 — ABNORMAL HIGH (ref 0.8–1.2)
Prothrombin Time: 27.8 seconds — ABNORMAL HIGH (ref 11.4–15.2)

## 2021-11-12 LAB — CBC
HCT: 41 % (ref 39.0–52.0)
Hemoglobin: 12.8 g/dL — ABNORMAL LOW (ref 13.0–17.0)
MCH: 29.8 pg (ref 26.0–34.0)
MCHC: 31.2 g/dL (ref 30.0–36.0)
MCV: 95.3 fL (ref 80.0–100.0)
Platelets: 284 10*3/uL (ref 150–400)
RBC: 4.3 MIL/uL (ref 4.22–5.81)
RDW: 19.2 % — ABNORMAL HIGH (ref 11.5–15.5)
WBC: 11.4 10*3/uL — ABNORMAL HIGH (ref 4.0–10.5)
nRBC: 0 % (ref 0.0–0.2)

## 2021-11-12 LAB — LACTATE DEHYDROGENASE: LDH: 393 U/L — ABNORMAL HIGH (ref 98–192)

## 2021-11-12 NOTE — Progress Notes (Addendum)
ANTICOAGULATION CONSULT NOTE ? ?Pharmacy Consult for heparin ?Indication:  LVAD ? ?Allergies  ?Allergen Reactions  ? Desyrel [Trazodone] Other (See Comments)  ?  insomnia  ? Other Dermatitis  ?  Adhesive  ? Biopatch [Chlorhexidine] Dermatitis and Rash  ? ? ?Patient Measurements: ?Weight: 89.7 kg (197 lb 12 oz) ?Heparin Dosing Weight: 84kg ? ?Vital Signs: ?Temp: 97.8 ?F (36.6 ?C) (05/14 0746) ?Temp Source: Oral (05/14 0746) ?BP: 94/76 (05/14 8527) ?Pulse Rate: 38 (05/14 0833) ? ?Labs: ?Recent Labs  ?  11/10/21 ?1001 11/10/21 ?1608 11/11/21 ?0305 11/12/21 ?0117  ?HGB 13.6  --  13.2 12.8*  ?HCT 43.6  --  41.4 41.0  ?PLT 340  --  327 284  ?LABPROT 67.5* 39.6* 27.6* 27.8*  ?INR 8.2* 4.1* 2.6* 2.6*  ?CREATININE 1.69* 1.41* 1.14 1.05  ? ? ? ?Estimated Creatinine Clearance: 67.1 mL/min (by C-G formula based on SCr of 1.05 mg/dL). ? ? ?Medical History: ?Past Medical History:  ?Diagnosis Date  ? Acute on chronic respiratory failure ([place])   ? a. 08/2014 in setting of PE.  ? Ankylosing spondylitis ([place])   ? Bilateral pulmonary embolism ([facility])   ? a. 08/2014 - started on Coumadin. Retrievable IVC filter placed 08/27/14 due to RV strain and large clot burden.  ? CAD (coronary artery disease)   ? a. stenting of LCx 2013; b. STEMI 06/12/14 s/p PCI to LAD complicated by post cath shock requiring IABP; VT s/p DCCV, EF 20%; c. NSTEMI 06/26/14 treated medically.  ? Carotid artery disease ([place])   ? a. s/p stenting.  ? Chronic systolic CHF (congestive heart failure) ([place])   ? Diabetes ([place])   ? Hemoptysis   ? a. 08/2014 possibly due to pulm infarct/PE.  ? Hyperlipidemia   ? Hypertension   ? Hypotension   ? Ischemic cardiomyopathy   ? a. echo 08/23/2014 EF <20%, dilated CM, mod MR/TR  ? Leukocytosis   ? Leukocytosis 09/10/2014  ? Nephrolithiasis   ? Pleural effusion on right 08/2014 - small  ? Reactive thrombocytosis 09/10/2014  ? Right leg DVT ([place])   ? a. 08/2014.  ? Ventricular tachycardia ([place])   ? a. 06/2014 at time of MI, s/p DCCV.   ? ?Assessment: ?36 yoM with HM2 LVAD admitted with VT. Pt is onw arfarin PTA with INR >8 on admit. Hgb down 13.3 > 11.4, plt stable. LDH stable. Pharmacy to start heparin once INR <1.8. ? ?INR still 2.6 ? ?Plan:  ?Hold warfarin ?Heparin once INR <1.8 ? ?Laurey Arrow, PharmD ?PGY1 Pharmacy Resident ?11/12/2021  9:22 AM ? ?Please check [email or web address] for unit-specific pharmacy phone numbers. ? ?

## 2021-11-12 NOTE — Progress Notes (Addendum)
Patient ID: Scott [name], male   DOB: [birth year], 76 y.o.   MRN: [id number] ?  ?Advanced Heart Failure VAD Team Note ? ?PCP-Cardiologist: None  ? ?Subjective:   ? ?5/12: VT with DCCV, CHB after DCCV.  Amiodarone stopped and dopamine begun.  ? ?He is on dopamine 4 this morning, MAP 80s.  In CHB with HR around 40 bpm.   ? ?Feels better, no complaints.  No low flow alarms on LVAD.  ? ?LVAD INTERROGATION:  ?HeartMate 2 LVAD:   ?Flow 4.4 liters/min, speed 9200, power 5.1, PI 5.0.  3 PI events.   ? ?Objective:   ? ?Vital Signs:   ?Temp:  [97.5 ?F (36.4 ?C)-98.2 ?F (36.8 ?C)] 97.8 ?F (36.6 ?C) (05/14 0746) ?Pulse Rate:  [33-136] 38 (05/14 0833) ?Resp:  [11-27] 22 (05/14 6295) ?BP: (74-121)/(64-88) 94/76 (05/14 2841) ?SpO2:  [87 %-99 %] 97 % (05/14 0833) ?Weight:  [89.7 kg] 89.7 kg (05/14 0600) ?Last BM Date : 11/11/21 ?Mean arterial Pressure 80s ? ?Intake/Output:  ? ?Intake/Output Summary (Last 24 hours) at 11/12/2021 0926 ?Last data filed at 11/12/2021 0700 ?Gross per 24 hour  ?Intake 627.11 ml  ?Output 800 ml  ?Net -172.89 ml  ?  ? ?Physical Exam  ?  ?General: Well appearing this am. NAD.  ?HEENT: Normal. ?Neck: Supple, JVP 7-8 cm. Carotids OK.  ?Cardiac:  Mechanical heart sounds with LVAD hum present.  ?Lungs:  CTAB, normal effort.  ?Abdomen:  NT, ND, no HSM. No bruits or masses. +BS  ?LVAD exit site: Well-healed and incorporated. Dressing dry and intact. No erythema or drainage. Stabilization device present and accurately applied. Driveline dressing changed daily per sterile technique. ?Extremities:  Warm and dry. No cyanosis, clubbing, rash, or edema.  ?Neuro:  Alert & oriented x 3. Cranial nerves grossly intact. Moves all 4 extremities w/o difficulty. Affect pleasant   ? ?Telemetry  ? ?CHB rate around 40, narrow complex escape.  Personally reviewed ? ?Labs  ? ?Basic Metabolic Panel: ?Recent Labs  ?Lab 11/10/21 ?0836 11/10/21 ?0903 11/10/21 ?1001 11/10/21 ?1001 11/10/21 ?1608 11/11/21 ?0305 11/12/21 ?0117  ?NA 137  140 141  --  140 136 135  ?K 8.4* 5.1 5.8*  --  4.8 4.4 4.6  ?CL 108  --  108  --  108 106 104  ?CO2  --   --  24  --  '24 24 25  '$ ?GLUCOSE 142*  --  133*  --  115* 116* 128*  ?BUN 59*  --  32*  --  29* 25* 21  ?CREATININE 1.70*  --  1.69*  --  1.41* 1.14 1.05  ?CALCIUM  --   --  8.9   < > 9.0 8.7* 8.7*  ?MG  --   --  2.1  --   --   --   --   ? < > = values in this interval not displayed.  ? ? ?Liver Function Tests: ?Recent Labs  ?Lab 11/10/21 ?1001  ?AST 53*  ?ALT 108*  ?ALKPHOS 140*  ?BILITOT 2.2*  ?PROT 7.1  ?ALBUMIN 3.5  ? ?No results for input(s): LIPASE, AMYLASE in the last 168 hours. ?No results for input(s): AMMONIA in the last 168 hours. ? ?CBC: ?Recent Labs  ?Lab 11/10/21 ?3244 11/10/21 ?0102 11/10/21 ?7253 11/10/21 ?1001 11/11/21 ?0305 11/12/21 ?0117  ?WBC 11.7*  --   --  12.7* 13.3* 11.4*  ?NEUTROABS  --   --   --  11.0*  --   --   ?HGB  16.2 18.7* 15.0 13.6 13.2 12.8*  ?HCT 54.0* 55.0* 44.0 43.6 41.4 41.0  ?MCV 97.5  --   --  95.2 93.9 95.3  ?PLT 356  --   --  340 327 284  ? ? ?INR: ?Recent Labs  ?Lab 11/10/21 ?1001 11/10/21 ?1608 11/11/21 ?0305 11/12/21 ?0117  ?INR 8.2* 4.1* 2.6* 2.6*  ? ? ?Other results: ?EKG:  ? ? ?Imaging  ? ?No results found. ? ? ?Medications:   ? ? ?Scheduled Medications: ? aspirin EC  81 mg Oral Daily  ? cephALEXin  500 mg Oral BID  ? etomidate  10 mg Intravenous Once  ? midazolam  0.5 mg Intravenous Once  ? pantoprazole  40 mg Oral Daily  ? rosuvastatin  10 mg Oral Once per day on Mon Wed Fri  ? sodium chloride flush  3 mL Intravenous Q12H  ? ? ?Infusions: ? sodium chloride    ? DOPamine 4 mcg/kg/min (11/12/21 0700)  ? ? ?PRN Medications: ?sodium chloride, acetaminophen, ondansetron (ZOFRAN) IV, sodium chloride flush ? ? ?Assessment/Plan:   ? ?1. VT: Recently admitted with VT @ 290 bpm. Cardioversion x1. Loaded on amiodarone.  ?- Presented 5/12 in recurrent VT. Urgent cardioversion 200J x1---> CHB.  ?- No further VT.  ?- EP has seen, tentative plan for dual chamber ICD placement.   ?- On dopamine for CHB (see below).  Will wean down slowly to make sure HR/MAP tolerate, decrease to 2 today.   ? ?2. CHB: Post-DCCV.  He remains in CHB today, HR around 40.  MAP stable, creatinine down to 1.05.  No complaints, no dizziness.  No low flow alarms on LVAD interrogation.  ?- Monitor for now.  ?- Wean down dopamine slowly, drop to 2 mcg/kg/min now.    ?- Discussed with EP yesterday, tentative plan for dual chamber ICD. May or may not be tomorrow per Dr. Quentin Ore.  ?  ?3. Chronic HFrEF with HMII LVAD, ICM.  ?-  Echo LVEF < 20% RV severely HK. Septum midline. ?- Volume status stable.  ?- no ARB/BB.  ?- Creatinine down to 1.05.  ?  ?4. LVAD HMII--> Destination Therapy Implanted 2015 ?- No further low flows/PI events post-DCCV from VT despite CHB.  ?- Follow daily LDH, INR ?- INR 2.6.   ?- Holding coumadin and plavix for PPM, will cover with heparin gtt when INR < 2.  ?- On keflex for chronic DL infection prophylaxis.  ?  ?5. Hyperkalemia  ?- Resolved.  ?  ?6. AKI on CKD Stage II ?- Creatinine on admit 1.7  ?- Now down to 1.05.  ?  ? ?I reviewed the LVAD parameters from today, and compared the results to the patient's prior recorded data.  No programming changes were made.  The LVAD is functioning within specified parameters.  The patient performs LVAD self-test daily.  LVAD interrogation was negative for any significant power changes, alarms or PI events/speed drops.  LVAD equipment check completed and is in good working order.  Back-up equipment present.   LVAD education done on emergency procedures and precautions and reviewed exit site care. ? ?Length of Stay: 2 ? ?Loralie Champagne, MD ?11/12/2021, 9:26 AM ? ?VAD Team --- VAD ISSUES ONLY--- ?Pager [phone] (7am - 7am) ? ?Advanced Heart Failure Team  ?Pager [phone] (M-F; 7a - 5p)  ?Please contact [facility] for night-coverage after hours (5p -7a ) and weekends on [email or web address] ? ?  ?

## 2021-11-12 NOTE — Progress Notes (Signed)
? ?EP Progress Note ? ?Patient Name: Scott [name] ?Date of Encounter: 11/12/2021 ? ?[facility] Cardiologist: None  ? ?Subjective  ? ?NAEO. ?No further VT/VF episodes.  ?Remains in CHB. ? ?Inpatient Medications  ?  ?Scheduled Meds: ? aspirin EC  81 mg Oral Daily  ? cephALEXin  500 mg Oral BID  ? etomidate  10 mg Intravenous Once  ? midazolam  0.5 mg Intravenous Once  ? pantoprazole  40 mg Oral Daily  ? rosuvastatin  10 mg Oral Once per day on Mon Wed Fri  ? sodium chloride flush  3 mL Intravenous Q12H  ? ?Continuous Infusions: ? sodium chloride    ? DOPamine 2 mcg/kg/min (11/12/21 1000)  ? ?PRN Meds: ?sodium chloride, acetaminophen, ondansetron (ZOFRAN) IV, sodium chloride flush  ? ?Vital Signs  ?  ?Vitals:  ? 11/12/21 0833 11/12/21 0900 11/12/21 0955 11/12/21 1000  ?BP: 94/76  103/82 91/79  ?Pulse: (!) 38 (!) 41    ?Resp: (!) 22 (!) 21 (!) 21 (!) 21  ?Temp:      ?TempSrc:      ?SpO2: 97% 96%    ?Weight:      ? ? ?Intake/Output Summary (Last 24 hours) at 11/12/2021 1028 ?Last data filed at 11/12/2021 1000 ?Gross per 24 hour  ?Intake 645.72 ml  ?Output 1000 ml  ?Net -354.28 ml  ? ? ?  11/12/2021  ?  6:00 AM 11/11/2021  ?  6:20 AM 11/03/2021  ?  6:36 AM  ?Last 3 Weights  ?Weight (lbs) 197 lb 12 oz 195 lb 15.8 oz 186 lb 8.2 oz  ?Weight (kg) 89.7 kg 88.9 kg 84.6 kg  ?   ? ?Telemetry  ?  ?CHB - Personally Reviewed ? ?ECG  ?  ?Personally Reviewed ? ?Physical Exam  ? ?GEN: No acute distress.   ?Neck: No JVD ?Cardiac: VAD hum. Warm. ?Respiratory: Clear to auscultation bilaterally. ?GI: Soft, nontender, non-distended  ?MS: No edema; No deformity. ?Neuro:  Nonfocal  ?Psych: Normal affect  ? ?Labs  ?  ?High Sensitivity Troponin:  No results for input(s): TROPONINIHS in the last 720 hours.   ?Chemistry ?Recent Labs  ?Lab 11/10/21 ?1001 11/10/21 ?1608 11/11/21 ?0305 11/12/21 ?0117  ?NA 141 140 136 135  ?K 5.8* 4.8 4.4 4.6  ?CL 108 108 106 104  ?CO2 '24 24 24 25  '$ ?GLUCOSE 133* 115* 116* 128*  ?BUN 32* 29* 25* 21  ?CREATININE  1.69* 1.41* 1.14 1.05  ?CALCIUM 8.9 9.0 8.7* 8.7*  ?MG 2.1  --   --   --   ?PROT 7.1  --   --   --   ?ALBUMIN 3.5  --   --   --   ?AST 53*  --   --   --   ?ALT 108*  --   --   --   ?ALKPHOS 140*  --   --   --   ?BILITOT 2.2*  --   --   --   ?GFRNONAA 42* 52* >60 >60  ?ANIONGAP '9 8 6 6  '$ ?  ?Lipids No results for input(s): CHOL, TRIG, HDL, LABVLDL, LDLCALC, CHOLHDL in the last 168 hours.  ?Hematology ?Recent Labs  ?Lab 11/10/21 ?1001 11/11/21 ?0305 11/12/21 ?0117  ?WBC 12.7* 13.3* 11.4*  ?RBC 4.58 4.41 4.30  ?HGB 13.6 13.2 12.8*  ?HCT 43.6 41.4 41.0  ?MCV 95.2 93.9 95.3  ?MCH 29.7 29.9 29.8  ?MCHC 31.2 31.9 31.2  ?RDW 20.0* 19.6* 19.2*  ?PLT 340 327 284  ? ?  Thyroid No results for input(s): TSH, FREET4 in the last 168 hours.  ?BNPNo results for input(s): BNP, PROBNP in the last 168 hours.  ?DDimer No results for input(s): DDIMER in the last 168 hours.  ? ?Radiology  ?  ?No results found. ? ? ?Assessment & Plan  ?  ?Scott [name] is a 76 y.o. male with a hx of CAD anterior STEMI with VT arrest in 12/15, large PE with pulmonary infarct in 2/15 s/p IVC filter, severe ICM > HMII LVAD (09/21/2014), ankylosig spondylitis, DM, , PVD s/p carotid stent,  who was seen 11/10/2021 for the evaluation of recurrent VT at the request of Dr. Haroldine Laws. ? ?#VT/VF ?Quiescent on amiodarone. ?INR 2.6 now. ?Tentative plan for ICD early this week. Keep NPO in case procedure can be performed tomorrow. ? ?#CHB ?Plan for DDD ICD as above. ? ? ? ?For questions or updates, please contact [place] ?Please consult [email or web address] for contact info under  ? ?  ?   ?Signed, ?Vickie Epley, MD  ?11/12/2021, 10:28 AM   ? ?

## 2021-11-13 DIAGNOSIS — Z95811 Presence of heart assist device: Secondary | ICD-10-CM | POA: Diagnosis not present

## 2021-11-13 DIAGNOSIS — I472 Ventricular tachycardia, unspecified: Secondary | ICD-10-CM | POA: Diagnosis not present

## 2021-11-13 DIAGNOSIS — I5022 Chronic systolic (congestive) heart failure: Secondary | ICD-10-CM | POA: Diagnosis not present

## 2021-11-13 DIAGNOSIS — E875 Hyperkalemia: Secondary | ICD-10-CM | POA: Diagnosis not present

## 2021-11-13 LAB — CBC
HCT: 41.7 % (ref 39.0–52.0)
Hemoglobin: 13.2 g/dL (ref 13.0–17.0)
MCH: 30 pg (ref 26.0–34.0)
MCHC: 31.7 g/dL (ref 30.0–36.0)
MCV: 94.8 fL (ref 80.0–100.0)
Platelets: 287 10*3/uL (ref 150–400)
RBC: 4.4 MIL/uL (ref 4.22–5.81)
RDW: 19.1 % — ABNORMAL HIGH (ref 11.5–15.5)
WBC: 10.4 10*3/uL (ref 4.0–10.5)
nRBC: 0 % (ref 0.0–0.2)

## 2021-11-13 LAB — BASIC METABOLIC PANEL
Anion gap: 5 (ref 5–15)
BUN: 16 mg/dL (ref 8–23)
CO2: 27 mmol/L (ref 22–32)
Calcium: 8.7 mg/dL — ABNORMAL LOW (ref 8.9–10.3)
Chloride: 104 mmol/L (ref 98–111)
Creatinine, Ser: 0.86 mg/dL (ref 0.61–1.24)
GFR, Estimated: >60 mL/min (ref >60)
Glucose, Bld: 85 mg/dL (ref 70–99)
Potassium: 4.3 mmol/L (ref 3.5–5.1)
Sodium: 136 mmol/L (ref 135–145)

## 2021-11-13 LAB — PROTIME-INR
INR: 2.5 — ABNORMAL HIGH (ref 0.8–1.2)
Prothrombin Time: 26.7 seconds — ABNORMAL HIGH (ref 11.4–15.2)

## 2021-11-13 LAB — SURGICAL PCR SCREEN
MRSA, PCR: NEGATIVE
Staphylococcus aureus: NEGATIVE

## 2021-11-13 LAB — LACTATE DEHYDROGENASE: LDH: 380 U/L — ABNORMAL HIGH (ref 98–192)

## 2021-11-13 MED ORDER — FENTANYL CITRATE PF 50 MCG/ML IJ SOSY
PREFILLED_SYRINGE | INTRAMUSCULAR | Status: AC
  Administered 2021-11-13 (×2): 50 ug
  Filled 2021-11-13: qty 2

## 2021-11-13 MED ORDER — FLUMAZENIL 0.5 MG/5ML IV SOLN
INTRAVENOUS | Status: AC
  Filled 2021-11-13: qty 5

## 2021-11-13 MED ORDER — MIDAZOLAM HCL 2 MG/2ML IJ SOLN
INTRAMUSCULAR | Status: AC
  Administered 2021-11-13: 1 mg
  Administered 2021-11-13: 2 mg
  Filled 2021-11-13: qty 2

## 2021-11-13 MED ORDER — ATROPINE SULFATE 1 MG/10ML IJ SOSY
PREFILLED_SYRINGE | INTRAMUSCULAR | Status: AC
  Filled 2021-11-13: qty 10

## 2021-11-13 MED ORDER — PHENYLEPHRINE 80 MCG/ML (10ML) SYRINGE FOR IV PUSH (FOR BLOOD PRESSURE SUPPORT)
PREFILLED_SYRINGE | INTRAVENOUS | Status: AC
  Administered 2021-11-13 (×2): 80 ug
  Administered 2021-11-13 (×2): 160 ug
  Filled 2021-11-13: qty 20

## 2021-11-13 MED ORDER — MIDAZOLAM HCL 2 MG/2ML IJ SOLN
INTRAMUSCULAR | Status: AC
  Filled 2021-11-13: qty 2

## 2021-11-13 MED ORDER — ETOMIDATE 2 MG/ML IV SOLN
INTRAVENOUS | Status: AC
  Filled 2021-11-13: qty 10

## 2021-11-13 MED ORDER — NALOXONE HCL 0.4 MG/ML IJ SOLN
INTRAMUSCULAR | Status: AC
  Administered 2021-11-13: 0.2 mg
  Filled 2021-11-13: qty 1

## 2021-11-13 NOTE — Progress Notes (Signed)
ANTICOAGULATION CONSULT NOTE ? ?Pharmacy Consult for heparin ?Indication:  LVAD ? ?Allergies  ?Allergen Reactions  ? Desyrel [Trazodone] Other (See Comments)  ?  insomnia  ? Other Dermatitis  ?  Adhesive  ? Biopatch [Chlorhexidine] Dermatitis and Rash  ? ? ?Patient Measurements: ?Height: '5\' 8"'$  (172.7 cm) ?Weight: 90.4 kg (199 lb 4.7 oz) ?IBW/kg (Calculated) : 68.4 ?Heparin Dosing Weight: 84kg ? ?Vital Signs: ?Temp: 97.7 ?F (36.5 ?C) (05/15 0830) ?Temp Source: Oral (05/15 0830) ?BP: 89/74 (05/15 0800) ?Pulse Rate: 66 (05/15 1200) ? ?Labs: ?Recent Labs  ?  11/11/21 ?0305 11/12/21 ?9622 11/13/21 ?2979  ?HGB 13.2 12.8* 13.2  ?HCT 41.4 41.0 41.7  ?PLT 327 284 287  ?LABPROT 27.6* 27.8* 26.7*  ?INR 2.6* 2.6* 2.5*  ?CREATININE 1.14 1.05 0.86  ? ? ? ?Estimated Creatinine Clearance: 82.3 mL/min (by C-G formula based on SCr of 0.86 mg/dL). ? ? ?Medical History: ?Past Medical History:  ?Diagnosis Date  ? Acute on chronic respiratory failure ([place])   ? a. 08/2014 in setting of PE.  ? Ankylosing spondylitis ([place])   ? Bilateral pulmonary embolism ([facility])   ? a. 08/2014 - started on Coumadin. Retrievable IVC filter placed 08/27/14 due to RV strain and large clot burden.  ? CAD (coronary artery disease)   ? a. stenting of LCx 2013; b. STEMI 06/12/14 s/p PCI to LAD complicated by post cath shock requiring IABP; VT s/p DCCV, EF 20%; c. NSTEMI 06/26/14 treated medically.  ? Carotid artery disease ([place])   ? a. s/p stenting.  ? Chronic systolic CHF (congestive heart failure) ([place])   ? Diabetes ([place])   ? Hemoptysis   ? a. 08/2014 possibly due to pulm infarct/PE.  ? Hyperlipidemia   ? Hypertension   ? Hypotension   ? Ischemic cardiomyopathy   ? a. echo 08/23/2014 EF <20%, dilated CM, mod MR/TR  ? Leukocytosis   ? Leukocytosis 09/10/2014  ? Nephrolithiasis   ? Pleural effusion on right 08/2014 - small  ? Reactive thrombocytosis 09/10/2014  ? Right leg DVT ([place])   ? a. 08/2014.  ? Ventricular tachycardia ([place])   ? a. 06/2014 at time of MI, s/p DCCV.   ? ?Assessment: ?8 yoM with HM2 LVAD admitted with VT. Pt is onw arfarin PTA with INR >8 on admit. Hgb down 13.3 > 11.4, plt stable. LDH stable. Pharmacy to start heparin once INR <1.8. ? ?INR still elevated at 2.5. Plan for ICD tomorrow. Hold anticoagulation for now.  ? ?Plan:  ?Hold warfarin ?Heparin once INR <1.8 ? ?Erin Hearing PharmD., BCPS ?Clinical Pharmacist ?11/13/2021 12:22 PM ? ?

## 2021-11-13 NOTE — Progress Notes (Signed)
RT NOTE: patient resting comfortably this AM with no respiratory distress noted.  Patient has a dream station machine on standby at bedside.  Will change bipap order from continuous due to patient not needing bipap at this time.  Will continue to monitor.  ?

## 2021-11-13 NOTE — Progress Notes (Signed)
LVAD Coordinator Rounding Note: ? ?Admitted 11/10/21 to Dr. Haroldine Laws due to VT.  ? ?HM II LVAD implanted on 09/21/14 by Dr. Darcey Nora under Destination Therapy criteria. ? ?Pt sitting up in bed, nurse at bedside, obtaining stand up weight. Pt out of bed without assistance. Denies complaints.  ? ?Probable ICD implant tomorrow. Remains in CHB with rate low 40's. ? ?Vital signs: ?Temp: 97.7 ?HR: 41 ?Doppler Pressure: 86 ?Automatic BP:  89/74 (80) ?O2 Sat: 95% on 2 L/[place] ?Wt: [phone]   ? ? ?LVAD interrogation reveals:  ?Speed: 9200 ?Flow: 4.7 ?Power:  5.3w ?PI:4.0  ? ?Alarms: none ?Events:  2 PI events yesterday ? ?Fixed speed: 9200 ?Low speed limit: 8600 ? ? ?Drive Line:  Dressing clean, dry, and intact. Anchor correctly applied. Maintained every 3 days by patient's wife at home. Continue every 3 day dressing changes using daily kit, cleansing with saline only. Dressing change per bedside RN or patient's wife [name]. Dressing change due 11/13/21.  ? ?Labs:  ?LDH trend:437>395>393>380 ? ?INR trend: 8.2>4.1>2.6>2.6>2.5 ? ?Anticoagulation Plan: ?-INR Goal:  2.0 - 3.0 ?-ASA Dose: 81 mg daily ? ?Blood Products:  ?- 11/10/21>> one unit FFP ? ?Device: N/A ? ?Arrythmias:  ?- 5/12//23 admitted with VT>>emergent DC-CV  ?- 11/10/21 complete heart block/bradycardia >>started on Dopamine gtt ? ? ? ?Plan/Recommendations:  ?Call VAD pager if any issues with VAD equipment or drive line site. ?VAD Coordinator will accompany and remain with patient for ICD placement.  ? ? ?Zada Girt RN, VAD Coordinator ?24/7 VAD pager: [phone] ?  ?

## 2021-11-13 NOTE — CV Procedure (Signed)
? ?   DIRECT CURRENT CARDIOVERSION ? ?NAME:  Scott [name]   MRN: [id number] ?DOB:  [birth year]   ADMIT DATE: 11/10/2021 ? ? ?INDICATIONS: Ventricular tachycardia ? ? ?PROCEDURE:  ? ?Emergent consent. Once an appropriate time out was taken, the patient had the defibrillator pads placed in the anterior and posterior position. The patient then underwent sedation by the CCM service. Once an appropriate level of sedation was achieved, the patient received a single biphasic, synchronized 200J shock with prompt conversion to sinus rhythm with CHB.  ? ?[name], MD  ?8:21 PM ? ?

## 2021-11-13 NOTE — Progress Notes (Signed)
? ?  Patient developed recurrent VT at 230-235 bpm.  ? ?BP soft. Flows decreased on VAD.  ? ?I met Dr. Duwayne Heck from CCM at bedside ? ?Decision made to perform electrical cardioversion.  ? ?Emergency equipment in place. Pads placed in anterior posterior position.  ? ?Time out performed. ? ?Dr. Duwayne Heck did conscious sedation with versed and fentanyl. ? ?Patient received IVF and several pushes of neosynephrine for support.  ? ?Successful DC-CV with 200J biphasic synchronized energy. Converted back to sinus with CHB in 30-40s.  ? ?Patient slow to wake up so we bagged him for a bit and gave low-dose narcan. Placed on Bipap.  ? ?Pressure supported with IVF and Neo as needed.   ? ?CRITICAL CARE ?Performed by: [name] ? ?Total critical care time: 45 minutes ? ?Critical care time was exclusive of separately billable procedures and treating other patients. ? ?Critical care was necessary to treat or prevent imminent or life-threatening deterioration. ? ?Critical care was time spent personally by me (independent of midlevel providers or residents) on the following activities: development of treatment plan with patient and/or surrogate as well as nursing, discussions with consultants, evaluation of patient's response to treatment, examination of patient, obtaining history from patient or surrogate, ordering and performing treatments and interventions, ordering and review of laboratory studies, ordering and review of radiographic studies, pulse oximetry and re-evaluation of patient's condition. ? ?[name], MD  ?8:21 PM ? ?

## 2021-11-13 NOTE — Progress Notes (Addendum)
Called to bedside for assistance with conscious sedation for cardioversion for patient in VT.   ? ?Preoxygenated with [place] and 15L via passive oxygenation via bag mask.   ? ?Versed '2mg'$ , fentanyl 30mg, followed by additional '1mg'$  versed, and 554m fentanyl.  ? ?Successfully cardioverted, followed by some light bag mask ventilation for assistance with volumes.   ?Patient given 0.2 narcan, responsive to stimuli, waking up but non verbal, and breathing well, bipap for continued assistance with volumes.   ? ?No desaturations during procedure.  ? ?Neosynephrine pushes given for hypotension intermittently by Dr. BeHaroldine Laws ? ?

## 2021-11-13 NOTE — Progress Notes (Signed)
RT at bedside for conscious sedation. Patient on 7L [place]. Tolerated procedure well. ETCO2 32. Placed patient on BIPAP 14/6 with 7L O2 bleed in. RT will continue to monitor. ?

## 2021-11-13 NOTE — Progress Notes (Addendum)
Patient ID: Scott [name], male   DOB: March 07, 1946, 76 y.o.   MRN: [id number] ?  ?Advanced Heart Failure VAD Team Note ? ?PCP-Cardiologist: None  ? ?Subjective:   ? ?5/12: VT with DCCV, CHB after DCCV.  Amiodarone stopped and dopamine begun. ?5/14 Dopamine weaned to 2 mcg.   ? ? ?No complaints.  ? ?LVAD INTERROGATION:  ?HeartMate 2 LVAD:   ?Flow 4.3 liters/min, speed 9200, power 5. PI 4. Rare PI events.  ? ?Objective:   ? ?Vital Signs:   ?Temp:  [97.4 ?F (36.3 ?C)-98.7 ?F (37.1 ?C)] 97.4 ?F (36.3 ?C) (05/15 0400) ?Pulse Rate:  [29-141] 32 (05/15 0600) ?Resp:  [14-25] 18 (05/15 0700) ?BP: (82-175)/(54-152) 82/70 (05/15 0700) ?SpO2:  [89 %-100 %] 95 % (05/15 0600) ?Weight:  [89.7 kg] 89.7 kg (05/15 0500) ?Last BM Date : 11/11/21 ?Mean arterial Pressure 70s  ? ?Intake/Output:  ? ?Intake/Output Summary (Last 24 hours) at 11/13/2021 0756 ?Last data filed at 11/13/2021 0600 ?Gross per 24 hour  ?Intake 561.98 ml  ?Output 600 ml  ?Net -38.02 ml  ?  ? ?Physical Exam  ?  ?Physical Exam: ?GENERAL: No acute distress. ?HEENT: normal  ?NECK: Supple, JVP flat  .  2+ bilaterally, no bruits.  No lymphadenopathy or thyromegaly appreciated.   ?CARDIAC:  Mechanical heart sounds with LVAD hum present.  ?LUNGS:  Clear to auscultation bilaterally. On 2 liters.  ?ABDOMEN:  Soft, round, nontender, positive bowel sounds x4.     ?LVAD exit site: well-healed and incorporated.  Dressing dry and intact.  No erythema or drainage.  Stabilization device present and accurately applied.  Driveline dressing is being changed daily per sterile technique. ?EXTREMITIES:  Warm and dry, no cyanosis, clubbing, rash or edema  ?NEUROLOGIC:  Alert and oriented x 3.    No aphasia.  No dysarthria.  Affect pleasant.    ? ?Telemetry  ?CHB 30-40s with PVCs  ? ?Labs  ? ?Basic Metabolic Panel: ?Recent Labs  ?Lab 11/10/21 ?1001 11/10/21 ?1608 11/11/21 ?0305 11/12/21 ?2536 11/13/21 ?6440  ?NA 141 140 136 135 136  ?K 5.8* 4.8 4.4 4.6 4.3  ?CL 108 108 106 104 104  ?CO2  '24 24 24 25 27  '$ ?GLUCOSE 133* 115* 116* 128* 85  ?BUN 32* 29* 25* 21 16  ?CREATININE 1.69* 1.41* 1.14 1.05 0.86  ?CALCIUM 8.9 9.0 8.7* 8.7* 8.7*  ?MG 2.1  --   --   --   --   ? ? ?Liver Function Tests: ?Recent Labs  ?Lab 11/10/21 ?1001  ?AST 53*  ?ALT 108*  ?ALKPHOS 140*  ?BILITOT 2.2*  ?PROT 7.1  ?ALBUMIN 3.5  ? ?No results for input(s): LIPASE, AMYLASE in the last 168 hours. ?No results for input(s): AMMONIA in the last 168 hours. ? ?CBC: ?Recent Labs  ?Lab 11/10/21 ?3474 11/10/21 ?2595 11/10/21 ?6387 11/10/21 ?1001 11/11/21 ?0305 11/12/21 ?5643 11/13/21 ?3295  ?WBC 11.7*  --   --  12.7* 13.3* 11.4* 10.4  ?NEUTROABS  --   --   --  11.0*  --   --   --   ?HGB 16.2   < > 15.0 13.6 13.2 12.8* 13.2  ?HCT 54.0*   < > 44.0 43.6 41.4 41.0 41.7  ?MCV 97.5  --   --  95.2 93.9 95.3 94.8  ?PLT 356  --   --  340 327 284 287  ? < > = values in this interval not displayed.  ? ? ?INR: ?Recent Labs  ?Lab 11/10/21 ?1001 11/10/21 ?  1608 11/11/21 ?0305 11/12/21 ?6073 11/13/21 ?7106  ?INR 8.2* 4.1* 2.6* 2.6* 2.5*  ? ? ?Other results: ?EKG:  ? ? ?Imaging  ? ?No results found. ? ? ?Medications:   ? ? ?Scheduled Medications: ? aspirin EC  81 mg Oral Daily  ? cephALEXin  500 mg Oral BID  ? etomidate  10 mg Intravenous Once  ? midazolam  0.5 mg Intravenous Once  ? pantoprazole  40 mg Oral Daily  ? rosuvastatin  10 mg Oral Once per day on Mon Wed Fri  ? sodium chloride flush  3 mL Intravenous Q12H  ? ? ?Infusions: ? sodium chloride    ? DOPamine 2 mcg/kg/min (11/13/21 0600)  ? ? ?PRN Medications: ?sodium chloride, acetaminophen, ondansetron (ZOFRAN) IV, sodium chloride flush ? ? ?Assessment/Plan:   ? ?1. VT: Recently admitted with VT @ 290 bpm. Cardioversion x1. Loaded on amiodarone.  ?- Presented 5/12 in recurrent VT. Urgent cardioversion 200J x1---> CHB.  ?- No further VT.  ?- EP has seen, tentative plan for dual chamber ICD placement.  ? ?2. CHB: Post-DCCV.  He remains in CHB today, HR around 40.  MAP stable, creatinine down to 1.05.  No  complaints, no dizziness.  No low flow alarms on LVAD interrogation.  ?- Monitor for now. Cut back  dopamine to 1 mcg.     ?- Discussed with EP, tentative plan for dual chamber ICD on Tuesday per Dr. Curt Bears.   ?  ?3. Chronic HFrEF with HMII LVAD, ICM.  ?-  Echo LVEF < 20% RV severely HK. Septum midline. ?-Volume status stable.  ?- no ARB/BB.  ?- Renal function stable.   ?  ?4. LVAD HMII--> Destination Therapy Implanted 2015 ?- No further low flows/PI events post-DCCV from VT despite CHB.  ?- Follow daily LDH, INR.  ?- LDH stable.  ?- INR 2.5    ?- Holding coumadin and plavix for PPM, will cover with heparin gtt when INR < 2.  ?- On keflex for chronic DL infection prophylaxis.  ?  ?5. Hyperkalemia  ?- Resolved.  ?  ?6. AKI on CKD Stage II ?- Creatinine on admit 1.7  ?- Resolved.  ?  ?I reviewed the LVAD parameters from today, and compared the results to the patient's prior recorded data.  No programming changes were made.  The LVAD is functioning within specified parameters.  The patient performs LVAD self-test daily.  LVAD interrogation was negative for any significant power changes, alarms or PI events/speed drops.  LVAD equipment check completed and is in good working order.  Back-up equipment present.   LVAD education done on emergency procedures and precautions and reviewed exit site care. ? ?Length of Stay: 3 ? ?Darrick Grinder, NP ?11/13/2021, 7:56 AM ? ?VAD Team --- VAD ISSUES ONLY--- ?Pager [phone] (7am - 7am) ? ?Advanced Heart Failure Team  ?Pager [phone] (M-F; 7a - 5p)  ?Please contact [facility] for night-coverage after hours (5p -7a ) and weekends on [email or web address] ? ?Patient seen and examined with the above-signed Advanced Practice Provider and/or Housestaff. I personally reviewed laboratory data, imaging studies and relevant notes. I independently examined the patient and formulated the important aspects of the plan. I have edited the note to reflect any of my changes or salient points. I have personally  discussed the plan with the patient and/or family. ? ?Remains in CHB. Feels ok. Denies CP or SOB. No further VT/VF. AKI resolved. Volume status ok. INR 2.5 ? ?General:  NAD.  ?HEENT: normal  ?Neck:  supple. JVP not elevated.  Carotids 2+ bilat; no bruits. No lymphadenopathy or thryomegaly appreciated. ?Cor: LVAD hum.  ?Lungs: Clear. ?Abdomen: soft, nontender, non-distended. No hepatosplenomegaly. No bruits or masses. Good bowel sounds. Driveline site clean. Anchor in place.  ?Extremities: no cyanosis, clubbing, rash. Warm no edema  ?Neuro: alert & oriented x 3. No focal deficits. Moves all 4 without problem  ? ?Remains in [facility]. Shock has resolved. Plan for ICD tomorrow. VAD interrogated personally. Parameters stable. ? ?[name], MD  ?12:52 PM ? ? ?  ?

## 2021-11-13 NOTE — Progress Notes (Addendum)
? ?Electrophysiology Rounding Note ? ?Patient Name: DEQUANN VANDERVELDEN ?Date of Encounter: 11/13/2021 ? ?Primary Cardiologist: None ?Electrophysiologist: None ? ? ?Subjective  ? ?NAEO. "Ready to get this over with" ? ?Inpatient Medications  ?  ?Scheduled Meds: ? aspirin EC  81 mg Oral Daily  ? cephALEXin  500 mg Oral BID  ? etomidate  10 mg Intravenous Once  ? midazolam  0.5 mg Intravenous Once  ? pantoprazole  40 mg Oral Daily  ? rosuvastatin  10 mg Oral Once per day on Mon Wed Fri  ? sodium chloride flush  3 mL Intravenous Q12H  ? ?Continuous Infusions: ? sodium chloride    ? DOPamine 2 mcg/kg/min (11/13/21 0600)  ? ?PRN Meds: ?sodium chloride, acetaminophen, ondansetron (ZOFRAN) IV, sodium chloride flush  ? ?Vital Signs  ?  ?Vitals:  ? 11/13/21 0300 11/13/21 0400 11/13/21 0500 11/13/21 0600  ?BP:  106/90  98/87  ?Pulse: (!) 57 (!) 58 66 (!) 32  ?Resp: '19 17 15 17  '$ ?Temp:  (!) 97.4 ?F (36.3 ?C)    ?TempSrc:  Axillary    ?SpO2: 93% 93% (!) 89% 95%  ?Weight:   89.7 kg   ? ? ?Intake/Output Summary (Last 24 hours) at 11/13/2021 0709 ?Last data filed at 11/13/2021 0600 ?Gross per 24 hour  ?Intake 561.98 ml  ?Output 600 ml  ?Net -38.02 ml  ? ?Filed Weights  ? 11/11/21 0620 11/12/21 0600 11/13/21 0500  ?Weight: 88.9 kg 89.7 kg 89.7 kg  ? ? ?Physical Exam  ?  ?GEN- The patient is well appearing, alert and oriented x 3 today.   ?Head- normocephalic, atraumatic ?Eyes-  Sclera clear, conjunctiva pink ?Ears- hearing intact ?Oropharynx- clear ?Neck- supple ?Lungs- Clear to ausculation bilaterally, normal work of breathing ?Heart- LVAD hum ?GI- soft, NT, ND, + BS ?Extremities- no clubbing or cyanosis. No edema ?Skin- no rash or lesion ?Psych- euthymic mood, full affect ?Neuro- strength and sensation are intact ? ?Labs  ?  ?CBC ?Recent Labs  ?  11/10/21 ?1001 11/11/21 ?0305 11/12/21 ?6203 11/13/21 ?5597  ?WBC 12.7*   < > 11.4* 10.4  ?NEUTROABS 11.0*  --   --   --   ?HGB 13.6   < > 12.8* 13.2  ?HCT 43.6   < > 41.0 41.7  ?MCV 95.2    < > 95.3 94.8  ?PLT 340   < > 284 287  ? < > = values in this interval not displayed.  ? ?Basic Metabolic Panel ?Recent Labs  ?  11/10/21 ?1001 11/10/21 ?1608 11/12/21 ?4163 11/13/21 ?8453  ?NA 141   < > 135 136  ?K 5.8*   < > 4.6 4.3  ?CL 108   < > 104 104  ?CO2 24   < > 25 27  ?GLUCOSE 133*   < > 128* 85  ?BUN 32*   < > 21 16  ?CREATININE 1.69*   < > 1.05 0.86  ?CALCIUM 8.9   < > 8.7* 8.7*  ?MG 2.1  --   --   --   ? < > = values in this interval not displayed.  ? ?Liver Function Tests ?Recent Labs  ?  11/10/21 ?1001  ?AST 53*  ?ALT 108*  ?ALKPHOS 140*  ?BILITOT 2.2*  ?PROT 7.1  ?ALBUMIN 3.5  ? ?No results for input(s): LIPASE, AMYLASE in the last 72 hours. ?Cardiac Enzymes ?No results for input(s): CKTOTAL, CKMB, CKMBINDEX, TROPONINI in the last 72 hours. ? ? ?Telemetry  ?  ?CHB  40s, periods of sinus brady in the 50s. (personally reviewed) ? ?Radiology  ?  ?No results found. ? ?Patient Profile  ?   ?NAYTHEN HEIKKILA is a 76 y.o. male with a hx of CAD anterior STEMI with VT arrest in 12/15, large PE with pulmonary infarct in 2/15 s/p IVC filter, severe ICM > HMII LVAD (09/21/2014), ankylosig spondylitis, DM, , PVD s/p carotid stent,  who was seen 11/10/2021 for the evaluation of recurrent VT at the request of Dr. Haroldine Laws. ? ?Assessment & Plan  ?  ?VT/VF ?Quiescent on amiodarone. ?INR 2.5 this am. . ?Tentative plan for ICD tomorrow. Clears after midnight and clear breakfast then NPO ?  ?2. Intermittent CHB ?Plan for DDD ICD as above ? ?3. Chronic systolic CHF ?4. LVAD ?AHF team managing.  ? ?Dr. Curt Bears has seen. Tentatively plan for ICD 5/16. ? ?For questions or updates, please contact [place] ?Please consult [email or web address] for contact info under Cardiology/STEMI. ? ?Signed, ?Shirley Friar, PA-C  ?11/13/2021, 7:09 AM  ? ?I have seen and examined this patient with Oda Kilts.  Agree with above, note added to reflect my findings.  Currently feeling well.  Frustrated that he remains in the  hospital.  Ready for ICD implant tomorrow. ? ?GEN: Well nourished, well developed, in no acute distress  ?HEENT: normal  ?Neck: no JVD, carotid bruits, or masses ?Cardiac: Bradycardic, LVAD hum; no murmurs, rubs, or gallops,no edema  ?Respiratory:  clear to auscultation bilaterally, normal work of breathing ?GI: soft, nontender, nondistended, + BS ?MS: no deformity or atrophy  ?Skin: warm and dry ?Neuro:  Strength and sensation are intact ?Psych: euthymic mood, full affect  ? ?Complete heart block: Patient Bayani Renteria need dual-chamber ICD.  We Deklynn Charlet plan for tomorrow.  We Kamylle Axelson keep n.p.o. after midnight tonight. ?Ventricular fibrillation: We Hommer Cunliffe need to be loaded on amiodarone after ICD implant. ?Chronic systolic heart failure: Post LVAD.  Plan per heart failure team. ? ?Khylin Gutridge M. Lashaun Krapf MD ?11/13/2021 ?2:00 PM  ?

## 2021-11-14 ENCOUNTER — Encounter (HOSPITAL_COMMUNITY)
Admission: EM | Disposition: A | Discharge status: Home / Self Care | Payer: Self-pay | Source: Home / Self Care | Attending: Internal Medicine

## 2021-11-14 DIAGNOSIS — I255 Ischemic cardiomyopathy: Secondary | ICD-10-CM | POA: Diagnosis not present

## 2021-11-14 DIAGNOSIS — E875 Hyperkalemia: Secondary | ICD-10-CM | POA: Diagnosis not present

## 2021-11-14 DIAGNOSIS — Z95811 Presence of heart assist device: Secondary | ICD-10-CM | POA: Diagnosis not present

## 2021-11-14 DIAGNOSIS — I472 Ventricular tachycardia, unspecified: Secondary | ICD-10-CM | POA: Diagnosis not present

## 2021-11-14 DIAGNOSIS — I5022 Chronic systolic (congestive) heart failure: Secondary | ICD-10-CM | POA: Diagnosis not present

## 2021-11-14 HISTORY — PX: ICD IMPLANT: EP1208

## 2021-11-14 LAB — CBC
HCT: 40.7 % (ref 39.0–52.0)
Hemoglobin: 12.6 g/dL — ABNORMAL LOW (ref 13.0–17.0)
MCH: 29.9 pg (ref 26.0–34.0)
MCHC: 31 g/dL (ref 30.0–36.0)
MCV: 96.4 fL (ref 80.0–100.0)
Platelets: 282 10*3/uL (ref 150–400)
RBC: 4.22 MIL/uL (ref 4.22–5.81)
RDW: 19.2 % — ABNORMAL HIGH (ref 11.5–15.5)
WBC: 9.1 10*3/uL (ref 4.0–10.5)
nRBC: 0 % (ref 0.0–0.2)

## 2021-11-14 LAB — BASIC METABOLIC PANEL
Anion gap: 6 (ref 5–15)
BUN: 15 mg/dL (ref 8–23)
CO2: 25 mmol/L (ref 22–32)
Calcium: 8.2 mg/dL — ABNORMAL LOW (ref 8.9–10.3)
Chloride: 105 mmol/L (ref 98–111)
Creatinine, Ser: 0.88 mg/dL (ref 0.61–1.24)
GFR, Estimated: >60 mL/min (ref >60)
Glucose, Bld: 74 mg/dL (ref 70–99)
Potassium: 4.4 mmol/L (ref 3.5–5.1)
Sodium: 136 mmol/L (ref 135–145)

## 2021-11-14 LAB — HEPATIC FUNCTION PANEL
ALT: 52 U/L — ABNORMAL HIGH (ref 0–44)
AST: 33 U/L (ref 15–41)
Albumin: 2.9 g/dL — ABNORMAL LOW (ref 3.5–5.0)
Alkaline Phosphatase: 121 U/L (ref 38–126)
Bilirubin, Direct: 0.7 mg/dL — ABNORMAL HIGH (ref 0.0–0.2)
Indirect Bilirubin: 1 mg/dL — ABNORMAL HIGH (ref 0.3–0.9)
Total Bilirubin: 1.7 mg/dL — ABNORMAL HIGH (ref 0.3–1.2)
Total Protein: 6.2 g/dL — ABNORMAL LOW (ref 6.5–8.1)

## 2021-11-14 LAB — LACTATE DEHYDROGENASE: LDH: 345 U/L — ABNORMAL HIGH (ref 98–192)

## 2021-11-14 LAB — PROTIME-INR
INR: 2.2 — ABNORMAL HIGH (ref 0.8–1.2)
Prothrombin Time: 24.2 seconds — ABNORMAL HIGH (ref 11.4–15.2)

## 2021-11-14 SURGERY — ICD IMPLANT

## 2021-11-14 MED ORDER — HEPARIN (PORCINE) IN NACL 1000-0.9 UT/500ML-% IV SOLN
INTRAVENOUS | Status: DC | PRN
  Administered 2021-11-14: 500 mL

## 2021-11-14 MED ORDER — HEPARIN (PORCINE) IN NACL 1000-0.9 UT/500ML-% IV SOLN
INTRAVENOUS | Status: AC
  Filled 2021-11-14: qty 500

## 2021-11-14 MED ORDER — LIDOCAINE HCL (PF) 1 % IJ SOLN
INTRAMUSCULAR | Status: DC | PRN
  Administered 2021-11-14: 90 mL

## 2021-11-14 MED ORDER — CEFAZOLIN SODIUM-DEXTROSE 2-4 GM/100ML-% IV SOLN
2.0000 g | INTRAVENOUS | Status: AC
  Administered 2021-11-14: 2 g via INTRAVENOUS

## 2021-11-14 MED ORDER — SODIUM CHLORIDE 0.9 % IV SOLN
80.0000 mg | INTRAVENOUS | Status: AC
  Administered 2021-11-14: 80 mg
  Filled 2021-11-14: qty 2

## 2021-11-14 MED ORDER — FENTANYL CITRATE PF 50 MCG/ML IJ SOSY
50.0000 ug | PREFILLED_SYRINGE | Freq: Once | INTRAMUSCULAR | Status: AC
  Administered 2021-11-14: 50 ug via INTRAVENOUS

## 2021-11-14 MED ORDER — SODIUM CHLORIDE 0.9 % IV SOLN: INTRAVENOUS | Status: DC

## 2021-11-14 MED ORDER — AMIODARONE HCL IN DEXTROSE 360-4.14 MG/200ML-% IV SOLN
30.0000 mg/h | INTRAVENOUS | Status: AC
  Administered 2021-11-15 – 2021-11-16 (×3): 30 mg/h via INTRAVENOUS
  Filled 2021-11-14 (×3): qty 200

## 2021-11-14 MED ORDER — FENTANYL CITRATE PF 50 MCG/ML IJ SOSY
25.0000 ug | PREFILLED_SYRINGE | INTRAMUSCULAR | Status: DC | PRN
  Administered 2021-11-14: 25 ug via INTRAVENOUS
  Filled 2021-11-14: qty 1

## 2021-11-14 MED ORDER — AMIODARONE HCL IN DEXTROSE 360-4.14 MG/200ML-% IV SOLN
60.0000 mg/h | INTRAVENOUS | Status: AC
  Administered 2021-11-14: 60 mg/h via INTRAVENOUS
  Filled 2021-11-14: qty 200

## 2021-11-14 MED ORDER — TRAMADOL HCL 50 MG PO TABS
50.0000 mg | ORAL_TABLET | Freq: Four times a day (QID) | ORAL | Status: DC | PRN
  Administered 2021-11-14 – 2021-11-15 (×2): 50 mg via ORAL
  Filled 2021-11-14 (×2): qty 1

## 2021-11-14 MED ORDER — CEFAZOLIN SODIUM-DEXTROSE 1-4 GM/50ML-% IV SOLN
1.0000 g | Freq: Four times a day (QID) | INTRAVENOUS | Status: AC
  Administered 2021-11-14 – 2021-11-15 (×3): 1 g via INTRAVENOUS
  Filled 2021-11-14 (×4): qty 50

## 2021-11-14 MED ORDER — LIDOCAINE HCL (PF) 1 % IJ SOLN
INTRAMUSCULAR | Status: AC
  Filled 2021-11-14: qty 30

## 2021-11-14 MED ORDER — AMIODARONE HCL IN DEXTROSE 360-4.14 MG/200ML-% IV SOLN
INTRAVENOUS | Status: AC | PRN
Start: 2021-11-14 — End: 2021-11-14
  Administered 2021-11-14: 60 mg/h via INTRAVENOUS

## 2021-11-14 MED ORDER — SODIUM CHLORIDE 0.9 % IV SOLN
INTRAVENOUS | Status: AC
  Filled 2021-11-14: qty 2

## 2021-11-14 MED ORDER — MIDAZOLAM HCL 2 MG/2ML IJ SOLN
INTRAMUSCULAR | Status: AC
  Filled 2021-11-14: qty 4

## 2021-11-14 MED ORDER — AMIODARONE LOAD VIA INFUSION
INTRAVENOUS | Status: DC | PRN
Start: 2021-11-14 — End: 2021-11-14
  Administered 2021-11-14: 150 mg via INTRAVENOUS

## 2021-11-14 MED ORDER — MIDAZOLAM HCL 2 MG/2ML IJ SOLN
2.0000 mg | Freq: Once | INTRAMUSCULAR | Status: AC
  Administered 2021-11-14: 2 mg via INTRAVENOUS

## 2021-11-14 MED ORDER — FENTANYL CITRATE PF 50 MCG/ML IJ SOSY
PREFILLED_SYRINGE | INTRAMUSCULAR | Status: AC
  Filled 2021-11-14: qty 2

## 2021-11-14 MED ORDER — ONDANSETRON HCL 4 MG/2ML IJ SOLN: 4.0000 mg | Freq: Four times a day (QID) | INTRAMUSCULAR | Status: DC | PRN

## 2021-11-14 MED ORDER — ACETAMINOPHEN 325 MG PO TABS
325.0000 mg | ORAL_TABLET | ORAL | Status: DC | PRN
  Administered 2021-11-15: 650 mg via ORAL
  Filled 2021-11-14: qty 2

## 2021-11-14 MED ORDER — CEFAZOLIN SODIUM-DEXTROSE 2-4 GM/100ML-% IV SOLN
INTRAVENOUS | Status: AC
  Filled 2021-11-14: qty 100

## 2021-11-14 SURGICAL SUPPLY — 15 items
CABLE SURGICAL S-101-97-12 (CABLE) ×2 IMPLANT
CATH RIGHTSITE C315HIS02 (CATHETERS) ×1 IMPLANT
ICD CLARIA MRI DTMA1D4 (ICD Generator) ×1 IMPLANT
LEAD CAPSURE NOVUS 5076-52CM (Lead) ×1 IMPLANT
LEAD SELECT SECURE 3830 383069 (Lead) IMPLANT
LEAD SPRINT QUAT SEC 6935M-62 (Lead) ×1 IMPLANT
MAT PREVALON FULL STRYKER (MISCELLANEOUS) ×1 IMPLANT
PAD DEFIB RADIO PHYSIO CONN (PAD) ×2 IMPLANT
SELECT SECURE 3830 383069 (Lead) ×2 IMPLANT
SHEATH 7FR PRELUDE SNAP 13 (SHEATH) ×2 IMPLANT
SHEATH 9FR PRELUDE SNAP 13 (SHEATH) ×1 IMPLANT
SHEATH PROBE COVER 6X72 (BAG) ×1 IMPLANT
SLITTER 6232ADJ (MISCELLANEOUS) ×1 IMPLANT
TRAY PACEMAKER INSERTION (PACKS) ×2 IMPLANT
WIRE HI TORQ VERSACORE-J 145CM (WIRE) ×1 IMPLANT

## 2021-11-14 NOTE — Progress Notes (Signed)
? ?  Patient with recurrent VT/VF at 230-240 range. MAPS low.  ? ?Given IVF and NEO pushes and airway support.  ? ?Underwent emergent DCCV back to sinus with CHB. EP aand CCM also at bedside.  ? ?Additional CCT 40 mins.  ? ?[name], MD  ?4:41 PM ? ?

## 2021-11-14 NOTE — Interval H&P Note (Signed)
History and Physical Interval Note: ? ?11/14/2021 ?1:10 PM ? ?Scott [name]  has presented today for surgery, with the diagnosis of vt - heart block.  The various methods of treatment have been discussed with the patient and family. After consideration of risks, benefits and other options for treatment, the patient has consented to  Procedure(s): ?ICD IMPLANT (N/A) as a surgical intervention.  The patient's history has been reviewed, patient examined, no change in status, stable for surgery.  I have reviewed the patient's chart and labs.  Questions were answered to the patient's satisfaction.   ? ? ?Scott [name] Scott [name] ? ?ICD Criteria ? ?Current LVEF:<20%. Within 12 months prior to implant: Yes  ? ?Heart failure history: Yes, Class IV ? ?Cardiomyopathy history: Yes, Ischemic Cardiomyopathy - Prior MI. ? ?Atrial Fibrillation/Atrial Flutter: No. ? ?Ventricular tachycardia history: Yes, Hemodynamic instability present. VT Type: Sustained Ventricular Tachycardia - Monomorphic. ? ?Cardiac arrest history: Yes, Ventricular Fibrillation. ? ?History of syndromes with risk of sudden death: No. ? ?Previous ICD: No. ? ?Current ICD indication: Secondary ? ?PPM indication: Yes. Pacing type: Ventricular. Greater than 40% RV pacing requirement anticipated. Indication: Complete Heart Block ? ?Class I or II Bradycardia indication present: Yes ? ?Beta Blocker therapy for 3 or more months: No, medical reason. ? ?Ace Inhibitor/ARB therapy for 3 or more months: No, medical reason. ? ? ?I have seen Scott [name] is a 76 y.o. malepre-procedural and has been referred by Bens[place] for consideration of ICD implant for primary prevention of sudden death.  The patient's chart has been reviewed and they meet criteria for ICD implant.  I have had a thorough discussion with the patient reviewing options.  The patient and their family (if available) have had opportunities to ask questions and have them answered. The patient and I have  decided together through the [place] Support Tool to implant ICD at this time.  Risks, benefits, alternatives to ICD implantation were discussed in detail with the patient today. The patient  understands that the risks include but are not limited to bleeding, infection, pneumothorax, perforation, tamponade, vascular damage, renal failure, MI, stroke, death, inappropriate shocks, and lead dislodgement and wishes to proceed.   ?

## 2021-11-14 NOTE — Progress Notes (Signed)
VAD Coordinator Procedure Note:  ? ?VAD Coordinator met patient in 2H19. Pt undergoing BiV ICD implant per Dr. Curt Bears. Hemodynamics and VAD parameters monitored by myself and EP nurse throughout the procedure. Blood pressures were obtained with automatic cuff on left arm and correlated with Doppler MAP. ? ? ? Time: Doppler Auto  ?BP Flow PI Power Speed  ?Pre-procedure:          ? 16:00 118 127/109 (117) 3.9 6.8 5.0 9200  ? 16:15  127/109 (116) 4.0 6.5 5.0   ? 16:30  116/105 (111) 4.2 5.9 5.1   ? 16:45  113/98 (105) 4.6 5.5 5.3   ? 17:00  105/93 (98) 4.3 5.2 5.1   ? 17:15 84 - - - 4.4 4.8 5.2   ? 17:30  100/86 (93) 4.5 4.8 5.1   ? 17:45  103/91 (98) 4.3 5.1 5.1   ? 18:00  105/91 (98) 4.8 5.3 5.3   ? 18:15  106/90 (96) 4.8 5.3 5.4   ? ? ?Patient tolerated the procedure well. VAD Coordinator accompanied and remained with patient during procedure.  ? ?Patient Disposition: 5B01 ? ?Zada Girt RN, VAD Coordinator ?24/7 VAD Pager: [phone] ? ?

## 2021-11-14 NOTE — Progress Notes (Signed)
ANTICOAGULATION CONSULT NOTE ? ?Pharmacy Consult for heparin ?Indication:  LVAD ? ?Allergies  ?Allergen Reactions  ? Desyrel [Trazodone] Other (See Comments)  ?  insomnia  ? Other Dermatitis  ?  Adhesive  ? Biopatch [Chlorhexidine] Dermatitis and Rash  ? ? ?Patient Measurements: ?Height: '5\' 8"'$  (172.7 cm) ?Weight: 90.4 kg (199 lb 4.7 oz) ?IBW/kg (Calculated) : 68.4 ?Heparin Dosing Weight: 84kg ? ?Vital Signs: ?Temp: 97.5 ?F (36.4 ?C) (05/16 0000) ?Temp Source: Oral (05/16 0000) ?BP: 104/93 (05/16 0600) ?Pulse Rate: 57 (05/16 0700) ? ?Labs: ?Recent Labs  ?  11/12/21 ?9528 11/13/21 ?4132 11/14/21 ?0255  ?HGB 12.8* 13.2 12.6*  ?HCT 41.0 41.7 40.7  ?PLT 284 287 282  ?LABPROT 27.8* 26.7* 24.2*  ?INR 2.6* 2.5* 2.2*  ?CREATININE 1.05 0.86 0.88  ? ? ? ?Estimated Creatinine Clearance: 80.4 mL/min (by C-G formula based on SCr of 0.88 mg/dL). ? ? ?Medical History: ?Past Medical History:  ?Diagnosis Date  ? Acute on chronic respiratory failure ([place])   ? a. 08/2014 in setting of PE.  ? Ankylosing spondylitis ([place])   ? Bilateral pulmonary embolism ([facility])   ? a. 08/2014 - started on Coumadin. Retrievable IVC filter placed 08/27/14 due to RV strain and large clot burden.  ? CAD (coronary artery disease)   ? a. stenting of LCx 2013; b. STEMI 06/12/14 s/p PCI to LAD complicated by post cath shock requiring IABP; VT s/p DCCV, EF 20%; c. NSTEMI 06/26/14 treated medically.  ? Carotid artery disease ([place])   ? a. s/p stenting.  ? Chronic systolic CHF (congestive heart failure) ([place])   ? Diabetes ([place])   ? Hemoptysis   ? a. 08/2014 possibly due to pulm infarct/PE.  ? Hyperlipidemia   ? Hypertension   ? Hypotension   ? Ischemic cardiomyopathy   ? a. echo 08/23/2014 EF <20%, dilated CM, mod MR/TR  ? Leukocytosis   ? Leukocytosis 09/10/2014  ? Nephrolithiasis   ? Pleural effusion on right 08/2014 - small  ? Reactive thrombocytosis 09/10/2014  ? Right leg DVT ([place])   ? a. 08/2014.  ? Ventricular tachycardia ([place])   ? a. 06/2014 at time of MI, s/p DCCV.   ? ?Assessment: ?17 yoM with HM2 LVAD admitted with VT. Pt is onw arfarin PTA with INR >8 on admit. Hgb down 13.3 > 11.4, plt stable. LDH stable. Pharmacy to start heparin once INR <1.8. ? ?INR remains therapeutic at 2.2 - no indication for parenteral anticoagulation. CBC and LDH stable. ? ?Plan:  ?Hold warfarin ?Heparin once INR <1.8 ? ?Arrie Senate, PharmD, BCPS, BCCP ?Clinical Pharmacist ?[phone] ?Please check AMION for all [place] numbers ?11/14/2021 ? ?

## 2021-11-14 NOTE — Progress Notes (Signed)
LVAD Coordinator Rounding Note: ? ?Admitted 11/10/21 to Dr. Haroldine Laws due to VT.  ? ?HM II LVAD implanted on 09/21/14 by Dr. Darcey Nora under Destination Therapy criteria. ? ?Patient up in chair at bedside with wife sitting beside him. He is awake and alert, ready for ICD implant later today. He does remember going into VT last night which required a cardioversion per Dr. Haroldine Laws.  ? ? ?Vital signs: ?Temp: 98.1 ?HR: 39 ?Doppler Pressure:  92 ?Automatic BP:  103/89 (96) ?O2 Sat: 90% on 2 L/[place] ?Wt: [phone]   ? ? ?LVAD interrogation reveals:  ?Speed: 9200 ?Flow: 4.3 ?Power:  5.3w ?PI: 5.0 ? ?Alarms: none ?Events:  2 PI events yesterday ? ?Fixed speed: 9200 ?Low speed limit: 8600 ? ? ?Drive Line:  Dressing clean, dry, and intact. Anchor correctly applied. Maintained every 3 days by patient's wife at home. Wife says she wants to do daily dressing changes at this time. Order changed to daily dressing changes using daily kit, cleansing with saline only. Dressing change per bedside RN or patient's wife Scott [name]. Dressing change due 11/14/21.  ? ?Labs:  ?LDH trend:437>395>393>380>345 ? ?INR trend: 8.2>4.1>2.6>2.6>2.5>2.2 ? ?Anticoagulation Plan: ?-INR Goal:  2.0 - 3.0 ?-ASA Dose: 81 mg daily ? ?Blood Products:  ?- 11/10/21>> one unit FFP ? ?Device: N/A ? ?Arrythmias:  ?- 5/12//23 admitted with VT>>emergent DC-CV  ?- 11/10/21 complete heart block/bradycardia >>started on Dopamine gtt ? ? ? ?Plan/Recommendations:  ?Call VAD pager if any issues with VAD equipment or drive line site. ?VAD Coordinator will accompany and remain with patient for ICD placement.  ? ? ?Zada Girt RN, VAD Coordinator ?24/7 VAD pager: [phone] ?  ?

## 2021-11-14 NOTE — Progress Notes (Signed)
?  Dr Haroldine Laws at bedside.  ?Recurrent VT rate 260s  ? ?Maps down to 30s.  ? ?Given IV fluids and Neo 240 mcg NEO.  ? ?Sedated 2 mg versed + fentanyl 50 mcg . Shock 200 J x1. ---> CHB 40s  ? ? ?EP and CCM in the room  ? ? ?Taysen Bushart NP-C  ?3:18 PM ? ? ? ?

## 2021-11-14 NOTE — Progress Notes (Addendum)
? ?Electrophysiology Rounding Note ? ?Patient Name: Scott [name] ?Date of Encounter: 11/14/2021 ? ?Primary Cardiologist: None ?Electrophysiologist: None ? ? ?Subjective  ? ?Recurrent VT and defib over night. OK this am.  ? ?Inpatient Medications  ?  ?Scheduled Meds: ? aspirin EC  81 mg Oral Daily  ? atropine      ? cephALEXin  500 mg Oral BID  ? gentamicin irrigation  80 mg Irrigation On Call  ? pantoprazole  40 mg Oral Daily  ? rosuvastatin  10 mg Oral Once per day on Mon Wed Fri  ? sodium chloride flush  3 mL Intravenous Q12H  ? ?Continuous Infusions: ? sodium chloride    ? sodium chloride 50 mL/hr at 11/14/21 0618  ? sodium chloride 50 mL/hr at 11/14/21 0616  ?  ceFAZolin (ANCEF) IV    ? DOPamine Stopped (11/13/21 1949)  ? ?PRN Meds: ?sodium chloride, acetaminophen, ondansetron (ZOFRAN) IV, sodium chloride flush  ? ?Vital Signs  ?  ?Vitals:  ? 11/14/21 0530 11/14/21 0600 11/14/21 0630 11/14/21 0700  ?BP:  (!) 104/93    ?Pulse: (!) 32   (!) 57  ?Resp: 12 (!) '24 13 20  '$ ?Temp:      ?TempSrc:      ?SpO2: 99%   91%  ?Weight:      ?Height:      ? ? ?Intake/Output Summary (Last 24 hours) at 11/14/2021 0724 ?Last data filed at 11/14/2021 0030 ?Gross per 24 hour  ?Intake 580.83 ml  ?Output 200 ml  ?Net 380.83 ml  ? ?Filed Weights  ? 11/13/21 0500 11/13/21 0800 11/14/21 0500  ?Weight: 89.7 kg 90.4 kg 90.4 kg  ? ? ?Physical Exam  ? ?General: Pleasant, NAD. No resp difficulty ?Psych: Normal affect. ?HEENT:  Normal, without mass or lesion.         ?Neck: Supple, no bruits or JVD. Carotids 2+. No lymphadenopathy/thyromegaly appreciated. ?Heart: LVAD hum. ?Lungs:  Resp regular and unlabored, CTA. ?Abdomen: Soft, non-tender, non-distended, No HSM, BS + x 4.   ?Extremities: No clubbing, cyanosis or edema. DP/PT/Radials 2+ and equal bilaterally. ?Neuro: Alert and oriented X 3. Moves all extremities spontaneously.  ? ?Labs  ?  ?CBC ?Recent Labs  ?  11/13/21 ?4650 11/14/21 ?0255  ?WBC 10.4 9.1  ?HGB 13.2 12.6*  ?HCT 41.7 40.7   ?MCV 94.8 96.4  ?PLT 287 282  ? ?Basic Metabolic Panel ?Recent Labs  ?  11/13/21 ?3546 11/14/21 ?0255  ?NA 136 136  ?K 4.3 4.4  ?CL 104 105  ?CO2 27 25  ?GLUCOSE 85 74  ?BUN 16 15  ?CREATININE 0.86 0.88  ?CALCIUM 8.7* 8.2*  ? ?Liver Function Tests ?Recent Labs  ?  11/14/21 ?0255  ?AST 33  ?ALT 52*  ?ALKPHOS 121  ?BILITOT 1.7*  ?PROT 6.2*  ?ALBUMIN 2.9*  ? ?No results for input(s): LIPASE, AMYLASE in the last 72 hours. ?Cardiac Enzymes ?No results for input(s): CKTOTAL, CKMB, CKMBINDEX, TROPONINI in the last 72 hours. ? ? ?Telemetry  ?  ?CHBs 30-40s, personally reviewed ? ?Radiology  ?  ?No results found. ? ?Patient Profile  ?   ?Scott [name] is a 76 y.o. male with a hx of CAD anterior STEMI with VT arrest in 12/15, large PE with pulmonary infarct in 2/15 s/p IVC filter, severe ICM > HMII LVAD (09/21/2014), ankylosig spondylitis, DM, , PVD s/p carotid stent,  who was seen 11/10/2021 for the evaluation of recurrent VT at the request of Dr. Haroldine Laws. ? ?Assessment &  Plan  ?  ?VT/VF ?INR 2.2 this am.  ?Recurrent overnight.  ?Yuji Walth need to resume amiodarone s/p ICD ?On board for ICD this afternoon.  ?Explained risks, benefits, and alternatives to ICD implantation, including but not limited to bleeding, infection, pneumothorax, pericardial effusion, lead dislodgement, heart attack, stroke, or death.  Pt verbalized understanding and agrees to proceed  ?  ?2. Intermittent CHB ?Plan for DDD ICD as above ? ?3. Chronic systolic CHF ?4. LVAD ?AHF team managing.  ? ?Dr. Curt Bears has seen. ICD this afternoon.  ? ?For questions or updates, please contact [place] ?Please consult [email or web address] for contact info under Cardiology/STEMI. ? ?Signed, ?Shirley Friar, PA-C  ?11/14/2021, 7:24 AM  ? ?I have seen and examined this patient with Oda Kilts.  Agree with above, note added to reflect my findings.  Feeling fatigued today but overall no complaints.  Did have VT overnight last night requiring  cardioversion. ? ?GEN: Well nourished, well developed, in no acute distress  ?HEENT: normal  ?Neck: no JVD, carotid bruits, or masses ?Cardiac: LVAD hum; no murmurs, rubs, or gallops,no edema  ?Respiratory:  clear to auscultation bilaterally, normal work of breathing ?GI: soft, nontender, nondistended, + BS ?MS: no deformity or atrophy  ?Skin: warm and dry ?Neuro:  Strength and sensation are intact ?Psych: euthymic mood, full affect  ? ?Ventricular tachycardia/fibrillation: Has had multiple episodes.  He is in complete heart block and thus Reiley Keisler need pacing prior to initiation of therapy.  Based on the patient's overall prognosis, and VT storm, this is a salvage procedure.  We did discuss with the patient's wife that the patient may have ventricular tachycardia during the ICD implant, and there is a chance that he Elbony Mcclimans not be able to be defibrillated into sinus rhythm.  Despite that, due to his VT, we Yulianna Folse plan for ICD therapy.  Risk and benefits were discussed which include bleeding, tamponade, infection, pneumothorax, among others.  He understands these risks and is agreed to the procedure.  We Barbar Brede start amiodarone after implant of his device.  Once the ICD lead is in, we Tifanny Dollens start IV amiodarone. ?Complete heart block: Plan for dual-chamber ICD today as above. ?Chronic systolic heart failure: Status post HeartMate 2 LVAD.  Plan per heart failure cardiology. ? ?Corine Solorio M. Terriah Reggio MD ?11/14/2021 ?1:07 PM  ?

## 2021-11-14 NOTE — Progress Notes (Signed)
Pt stataes he does not want to wear cpap tonight.  RT will continue to monitor. ?

## 2021-11-14 NOTE — H&P (View-Only) (Signed)
? ?Electrophysiology Rounding Note ? ?Patient Name: Scott [name] ?Date of Encounter: 11/14/2021 ? ?Primary Cardiologist: None ?Electrophysiologist: None ? ? ?Subjective  ? ?Recurrent VT and defib over night. OK this am.  ? ?Inpatient Medications  ?  ?Scheduled Meds: ? aspirin EC  81 mg Oral Daily  ? atropine      ? cephALEXin  500 mg Oral BID  ? gentamicin irrigation  80 mg Irrigation On Call  ? pantoprazole  40 mg Oral Daily  ? rosuvastatin  10 mg Oral Once per day on Mon Wed Fri  ? sodium chloride flush  3 mL Intravenous Q12H  ? ?Continuous Infusions: ? sodium chloride    ? sodium chloride 50 mL/hr at 11/14/21 0618  ? sodium chloride 50 mL/hr at 11/14/21 0616  ?  ceFAZolin (ANCEF) IV    ? DOPamine Stopped (11/13/21 1949)  ? ?PRN Meds: ?sodium chloride, acetaminophen, ondansetron (ZOFRAN) IV, sodium chloride flush  ? ?Vital Signs  ?  ?Vitals:  ? 11/14/21 0530 11/14/21 0600 11/14/21 0630 11/14/21 0700  ?BP:  (!) 104/93    ?Pulse: (!) 32   (!) 57  ?Resp: 12 (!) '24 13 20  '$ ?Temp:      ?TempSrc:      ?SpO2: 99%   91%  ?Weight:      ?Height:      ? ? ?Intake/Output Summary (Last 24 hours) at 11/14/2021 0724 ?Last data filed at 11/14/2021 0030 ?Gross per 24 hour  ?Intake 580.83 ml  ?Output 200 ml  ?Net 380.83 ml  ? ?Filed Weights  ? 11/13/21 0500 11/13/21 0800 11/14/21 0500  ?Weight: 89.7 kg 90.4 kg 90.4 kg  ? ? ?Physical Exam  ? ?General: Pleasant, NAD. No resp difficulty ?Psych: Normal affect. ?HEENT:  Normal, without mass or lesion.         ?Neck: Supple, no bruits or JVD. Carotids 2+. No lymphadenopathy/thyromegaly appreciated. ?Heart: LVAD hum. ?Lungs:  Resp regular and unlabored, CTA. ?Abdomen: Soft, non-tender, non-distended, No HSM, BS + x 4.   ?Extremities: No clubbing, cyanosis or edema. DP/PT/Radials 2+ and equal bilaterally. ?Neuro: Alert and oriented X 3. Moves all extremities spontaneously.  ? ?Labs  ?  ?CBC ?Recent Labs  ?  11/13/21 ?0354 11/14/21 ?0255  ?WBC 10.4 9.1  ?HGB 13.2 12.6*  ?HCT 41.7 40.7   ?MCV 94.8 96.4  ?PLT 287 282  ? ?Basic Metabolic Panel ?Recent Labs  ?  11/13/21 ?6568 11/14/21 ?0255  ?NA 136 136  ?K 4.3 4.4  ?CL 104 105  ?CO2 27 25  ?GLUCOSE 85 74  ?BUN 16 15  ?CREATININE 0.86 0.88  ?CALCIUM 8.7* 8.2*  ? ?Liver Function Tests ?Recent Labs  ?  11/14/21 ?0255  ?AST 33  ?ALT 52*  ?ALKPHOS 121  ?BILITOT 1.7*  ?PROT 6.2*  ?ALBUMIN 2.9*  ? ?No results for input(s): LIPASE, AMYLASE in the last 72 hours. ?Cardiac Enzymes ?No results for input(s): CKTOTAL, CKMB, CKMBINDEX, TROPONINI in the last 72 hours. ? ? ?Telemetry  ?  ?CHBs 30-40s, personally reviewed ? ?Radiology  ?  ?No results found. ? ?Patient Profile  ?   ?Scott [name] is a 76 y.o. male with a hx of CAD anterior STEMI with VT arrest in 12/15, large PE with pulmonary infarct in 2/15 s/p IVC filter, severe ICM > HMII LVAD (09/21/2014), ankylosig spondylitis, DM, , PVD s/p carotid stent,  who was seen 11/10/2021 for the evaluation of recurrent VT at the request of Dr. Haroldine Laws. ? ?Assessment &  Plan  ?  ?VT/VF ?INR 2.2 this am.  ?Recurrent overnight.  ?Sebron Mcmahill need to resume amiodarone s/p ICD ?On board for ICD this afternoon.  ?Explained risks, benefits, and alternatives to ICD implantation, including but not limited to bleeding, infection, pneumothorax, pericardial effusion, lead dislodgement, heart attack, stroke, or death.  Pt verbalized understanding and agrees to proceed  ?  ?2. Intermittent CHB ?Plan for DDD ICD as above ? ?3. Chronic systolic CHF ?4. LVAD ?AHF team managing.  ? ?Dr. Curt Bears has seen. ICD this afternoon.  ? ?For questions or updates, please contact [place] ?Please consult [email or web address] for contact info under Cardiology/STEMI. ? ?Signed, ?Shirley Friar, PA-C  ?11/14/2021, 7:24 AM  ? ?I have seen and examined this patient with Oda Kilts.  Agree with above, note added to reflect my findings.  Feeling fatigued today but overall no complaints.  Did have VT overnight last night requiring  cardioversion. ? ?GEN: Well nourished, well developed, in no acute distress  ?HEENT: normal  ?Neck: no JVD, carotid bruits, or masses ?Cardiac: LVAD hum; no murmurs, rubs, or gallops,no edema  ?Respiratory:  clear to auscultation bilaterally, normal work of breathing ?GI: soft, nontender, nondistended, + BS ?MS: no deformity or atrophy  ?Skin: warm and dry ?Neuro:  Strength and sensation are intact ?Psych: euthymic mood, full affect  ? ?Ventricular tachycardia: Has had multiple episodes.  He is in complete heart block and thus Norvell Caswell need pacing prior to initiation of therapy.  Due to his VT, we Anastasio Wogan plan for ICD therapy.  Risk and benefits were discussed which include bleeding, tamponade, infection, pneumothorax, among others.  He understands these risks and is agreed to the procedure.  We Lecia Esperanza start amiodarone after implant of his device. ?2.  Complete heart block: Plan for dual-chamber ICD today as above. ?3.  Chronic systolic heart failure: Status post HeartMate 2 LVAD.  Plan per heart failure cardiology. ? ? ?Deshaun Schou M. Sherial Ebrahim MD ?11/14/2021 ?1:07 PM  ?

## 2021-11-14 NOTE — CV Procedure (Signed)
? ?   DIRECT CURRENT CARDIOVERSION ? ?NAME:  Scott [name]   MRN: [id number] ?DOB:  [birth year]   ADMIT DATE: 11/10/2021 ? ? ?INDICATIONS: Ventricular tachycardia/fibrillation  ? ? ?PROCEDURE:  ? ?Emergent consent with wife in the room. Once an appropriate time out was taken, the patient had the defibrillator pads placed in the anterior and posterior position. The patient then underwent conscious sedation with CCM present.  Once an appropriate level of sedation was achieved, the patient received a single biphasic, synchronized 200J shock with prompt conversion to sinus rhythm with CHB. No apparent complications. ? ?[name], MD  ?4:41 PM  ?

## 2021-11-14 NOTE — Progress Notes (Signed)
Patient ID: Scott [name], male   DOB: [birth year], 76 y.o.   MRN: [id number] ?  ?Advanced Heart Failure VAD Team Note ? ?PCP-Cardiologist: None  ? ?Subjective:   ? ?5/12: VT with DCCV, CHB after DCCV.  Amiodarone stopped and dopamine begun. ?5/14 Dopamine weaned to 2 mcg.   ?5/15 Recurrent VT -> DC-CV ? ?Had VT overnight and had emergent DC-CV.  ? ?Remains in CHB in 30-40s. MAPs ok ? ?Feels weak. No dyspnea. No orthopnea or PND.  ? ?LVAD INTERROGATION:  ?HeartMate 2 LVAD:   ?Flow 4.0 liters/min, speed 9200, power 5.0. PI 6.1 ? ?Objective:   ? ?Vital Signs:   ?Temp:  [97.4 ?F (36.3 ?C)-97.7 ?F (36.5 ?C)] 97.4 ?F (36.3 ?C) (05/16 0744) ?Pulse Rate:  [27-134] 57 (05/16 0700) ?Resp:  [9-30] 20 (05/16 0700) ?BP: (81-124)/(62-102) 104/93 (05/16 0600) ?SpO2:  [69 %-100 %] 91 % (05/16 0700) ?Weight:  [90.4 kg] 90.4 kg (05/16 0500) ?Last BM Date : 11/11/21 ?Mean arterial Pressure 80-90s ? ?Intake/Output:  ? ?Intake/Output Summary (Last 24 hours) at 11/14/2021 0826 ?Last data filed at 11/14/2021 0030 ?Gross per 24 hour  ?Intake 331.48 ml  ?Output 200 ml  ?Net 131.48 ml  ? ?  ? ?Physical Exam  ?  ?General:  NAD.  ?HEENT: normal  ?Neck: supple. JVP not elevated.  Carotids 2+ bilat; no bruits. No lymphadenopathy or thryomegaly appreciated. ?Cor: LVAD hum.  ?Lungs: Clear. ?Abdomen: soft, nontender, non-distended. No hepatosplenomegaly. No bruits or masses. Good bowel sounds. Driveline site clean. Anchor in place.  ?Extremities: no cyanosis, clubbing, rash. Warm no edema  ?Neuro: alert & oriented x 3. No focal deficits. Moves all 4 without problem  ? ?Telemetry  ? ?Sinus with CHB 30-40 Personally reviewed ? ? ?Labs  ? ?Basic Metabolic Panel: ?Recent Labs  ?Lab 11/10/21 ?1001 11/10/21 ?1608 11/11/21 ?0305 11/12/21 ?1660 11/13/21 ?6301 11/14/21 ?0255  ?NA 141 140 136 135 136 136  ?K 5.8* 4.8 4.4 4.6 4.3 4.4  ?CL 108 108 106 104 104 105  ?CO2 '24 24 24 25 27 25  '$ ?GLUCOSE 133* 115* 116* 128* 85 74  ?BUN 32* 29* 25* '21 16 15   '$ ?CREATININE 1.69* 1.41* 1.14 1.05 0.86 0.88  ?CALCIUM 8.9 9.0 8.7* 8.7* 8.7* 8.2*  ?MG 2.1  --   --   --   --   --   ? ? ? ?Liver Function Tests: ?Recent Labs  ?Lab 11/10/21 ?1001 11/14/21 ?0255  ?AST 53* 33  ?ALT 108* 52*  ?ALKPHOS 140* 121  ?BILITOT 2.2* 1.7*  ?PROT 7.1 6.2*  ?ALBUMIN 3.5 2.9*  ? ? ?No results for input(s): LIPASE, AMYLASE in the last 168 hours. ?No results for input(s): AMMONIA in the last 168 hours. ? ?CBC: ?Recent Labs  ?Lab 11/10/21 ?1001 11/11/21 ?0305 11/12/21 ?6010 11/13/21 ?9323 11/14/21 ?0255  ?WBC 12.7* 13.3* 11.4* 10.4 9.1  ?NEUTROABS 11.0*  --   --   --   --   ?HGB 13.6 13.2 12.8* 13.2 12.6*  ?HCT 43.6 41.4 41.0 41.7 40.7  ?MCV 95.2 93.9 95.3 94.8 96.4  ?PLT 340 327 284 287 282  ? ? ? ?INR: ?Recent Labs  ?Lab 11/10/21 ?1608 11/11/21 ?0305 11/12/21 ?5573 11/13/21 ?2202 11/14/21 ?0255  ?INR 4.1* 2.6* 2.6* 2.5* 2.2*  ? ? ? ?Other results: ? ? ? ?Imaging  ? ?No results found. ? ? ?Medications:   ? ? ?Scheduled Medications: ? aspirin EC  81 mg Oral Daily  ? cephALEXin  500 mg Oral  BID  ? gentamicin irrigation  80 mg Irrigation On Call  ? pantoprazole  40 mg Oral Daily  ? rosuvastatin  10 mg Oral Once per day on Mon Wed Fri  ? sodium chloride flush  3 mL Intravenous Q12H  ? ? ?Infusions: ? sodium chloride    ? sodium chloride 50 mL/hr at 11/14/21 0618  ? sodium chloride 50 mL/hr at 11/14/21 0616  ?  ceFAZolin (ANCEF) IV    ? DOPamine Stopped (11/13/21 1949)  ? ? ?PRN Medications: ?sodium chloride, acetaminophen, ondansetron (ZOFRAN) IV, sodium chloride flush ? ? ?Assessment/Plan:   ? ?1. VT: Recently admitted with VT @ 290 bpm. Cardioversion x1. Loaded on amiodarone.  ?- Presented 5/12 in recurrent VT. Urgent cardioversion 200J x1---> CHB.  ?- Repeat VT 5/15 -> DC-CV ?- EP has seen, plan for dual chamber ICD placement today ?- Off amio due to CHB. Resume post implant ?- Keep K > 4.0 Mg > 2.0  ? ? ?2. CHB: Post-DCCV.  He remains in CHB today, HR around 40.  MAP stable, creatinine down to  1.05.  No complaints, no dizziness.  No low flow alarms on LVAD interrogation.  ?- Monitor for now. Cut back  dopamine to 1 mcg.     ?- Discussed with EP, plan for dual chamber ICD on Tuesday per Dr. Curt Bears.   ?  ?3. Chronic HFrEF with HMII LVAD, ICM.  ?-  Echo LVEF < 20% RV severely HK. Septum midline. ?-Volume status stable.  ?- no ARB/BB.  ?- Renal function stable ?  ?4. LVAD HMII--> Destination Therapy Implanted 2015 ?- No further low flows/PI events post-DCCV from VT despite CHB.  ?- Follow daily LDH, INR.  ?- LDH 345 ?- INR 2.2  ?- Holding coumadin and plavix for PPM, will cover with heparin gtt when INR < 2.  ?- On keflex for chronic DL infection prophylaxis.  ?  ?5. Hyperkalemia  ?- Resolved.  ?  ?6. AKI on CKD Stage II ?- Creatinine on admit 1.7  ?- Resolved. Scr 0.88 today ?  ?I reviewed the LVAD parameters from today, and compared the results to the patient's prior recorded data.  No programming changes were made.  The LVAD is functioning within specified parameters.  The patient performs LVAD self-test daily.  LVAD interrogation was negative for any significant power changes, alarms or PI events/speed drops.  LVAD equipment check completed and is in good working order.  Back-up equipment present.   LVAD education done on emergency procedures and precautions and reviewed exit site care. ? ?CRITICAL CARE ?Performed by: [name] ? ?Total critical care time: 35 minutes ? ?Critical care time was exclusive of separately billable procedures and treating other patients. ? ?Critical care was necessary to treat or prevent imminent or life-threatening deterioration. ? ?Critical care was time spent personally by me (independent of midlevel providers or residents) on the following activities: development of treatment plan with patient and/or surrogate as well as nursing, discussions with consultants, evaluation of patient's response to treatment, examination of patient, obtaining history from patient or  surrogate, ordering and performing treatments and interventions, ordering and review of laboratory studies, ordering and review of radiographic studies, pulse oximetry and re-evaluation of patient's condition. ? ? ?Length of Stay: 4 ? ?[name], MD ?11/14/2021, 8:26 AM ? ?VAD Team --- VAD ISSUES ONLY--- ?Pager [phone] (7am - 7am) ? ?Advanced Heart Failure Team  ?Pager [phone] (M-F; 7a - 5p)  ?Please contact [facility] for night-coverage after hours (  5p -7a ) and weekends on [email or web address] ? ? ? ?  ?

## 2021-11-15 ENCOUNTER — Encounter (HOSPITAL_COMMUNITY): Payer: Self-pay | Admitting: Cardiology

## 2021-11-15 ENCOUNTER — Inpatient Hospital Stay (HOSPITAL_COMMUNITY): Payer: Medicare Other

## 2021-11-15 DIAGNOSIS — I472 Ventricular tachycardia, unspecified: Secondary | ICD-10-CM | POA: Diagnosis not present

## 2021-11-15 LAB — CBC
HCT: 40.5 % (ref 39.0–52.0)
Hemoglobin: 12.5 g/dL — ABNORMAL LOW (ref 13.0–17.0)
MCH: 29.7 pg (ref 26.0–34.0)
MCHC: 30.9 g/dL (ref 30.0–36.0)
MCV: 96.2 fL (ref 80.0–100.0)
Platelets: 278 10*3/uL (ref 150–400)
RBC: 4.21 MIL/uL — ABNORMAL LOW (ref 4.22–5.81)
RDW: 19.4 % — ABNORMAL HIGH (ref 11.5–15.5)
WBC: 10.3 10*3/uL (ref 4.0–10.5)
nRBC: 0 % (ref 0.0–0.2)

## 2021-11-15 LAB — PROTIME-INR
INR: 1.9 — ABNORMAL HIGH (ref 0.8–1.2)
Prothrombin Time: 21.8 seconds — ABNORMAL HIGH (ref 11.4–15.2)

## 2021-11-15 LAB — LACTATE DEHYDROGENASE: LDH: 330 U/L — ABNORMAL HIGH (ref 98–192)

## 2021-11-15 LAB — MAGNESIUM: Magnesium: 1.7 mg/dL (ref 1.7–2.4)

## 2021-11-15 LAB — BASIC METABOLIC PANEL
Anion gap: 7 (ref 5–15)
BUN: 14 mg/dL (ref 8–23)
CO2: 26 mmol/L (ref 22–32)
Calcium: 8.2 mg/dL — ABNORMAL LOW (ref 8.9–10.3)
Chloride: 103 mmol/L (ref 98–111)
Creatinine, Ser: 0.98 mg/dL (ref 0.61–1.24)
GFR, Estimated: >60 mL/min (ref >60)
Glucose, Bld: 149 mg/dL — ABNORMAL HIGH (ref 70–99)
Potassium: 3.8 mmol/L (ref 3.5–5.1)
Sodium: 136 mmol/L (ref 135–145)

## 2021-11-15 MED ORDER — MAGNESIUM SULFATE 4 GM/100ML IV SOLN
4.0000 g | Freq: Once | INTRAVENOUS | Status: AC
  Administered 2021-11-15: 4 g via INTRAVENOUS
  Filled 2021-11-15: qty 100

## 2021-11-15 MED ORDER — FUROSEMIDE 10 MG/ML IJ SOLN
20.0000 mg | Freq: Once | INTRAMUSCULAR | Status: AC
  Administered 2021-11-15: 20 mg via INTRAVENOUS
  Filled 2021-11-15: qty 2

## 2021-11-15 MED ORDER — WARFARIN SODIUM 3 MG PO TABS
6.0000 mg | ORAL_TABLET | Freq: Once | ORAL | Status: AC
  Administered 2021-11-15: 6 mg via ORAL
  Filled 2021-11-15: qty 2

## 2021-11-15 MED ORDER — CYCLOBENZAPRINE HCL 10 MG PO TABS: 5.0000 mg | ORAL_TABLET | Freq: Three times a day (TID) | ORAL | Status: DC | PRN

## 2021-11-15 MED ORDER — POTASSIUM CHLORIDE CRYS ER 20 MEQ PO TBCR
40.0000 meq | EXTENDED_RELEASE_TABLET | Freq: Once | ORAL | Status: AC
  Administered 2021-11-15: 40 meq via ORAL
  Filled 2021-11-15: qty 2

## 2021-11-15 MED ORDER — WARFARIN - PHARMACIST DOSING INPATIENT: Freq: Every day | Status: DC

## 2021-11-15 MED ORDER — POTASSIUM CHLORIDE CRYS ER 20 MEQ PO TBCR
40.0000 meq | EXTENDED_RELEASE_TABLET | Freq: Once | ORAL | Status: DC
  Filled 2021-11-15: qty 2

## 2021-11-15 NOTE — Progress Notes (Signed)
ANTICOAGULATION CONSULT NOTE ? ?Pharmacy Consult for warfarin ?Indication:  LVAD ? ?Allergies  ?Allergen Reactions  ? Desyrel [Trazodone] Other (See Comments)  ?  insomnia  ? Other Dermatitis  ?  Adhesive  ? Biopatch [Chlorhexidine] Dermatitis and Rash  ? ? ?Patient Measurements: ?Height: '5\' 8"'$  (172.7 cm) ?Weight: 92.3 kg (203 lb 7.8 oz) ?IBW/kg (Calculated) : 68.4 ?Heparin Dosing Weight: 84kg ? ?Vital Signs: ?Temp: 97.7 ?F (36.5 ?C) (05/17 0400) ?Temp Source: Oral (05/17 0400) ?BP: 104/93 (05/17 0700) ?Pulse Rate: 80 (05/17 0700) ? ?Labs: ?Recent Labs  ?  11/13/21 ?0932 11/14/21 ?0255 11/15/21 ?0602  ?HGB 13.2 12.6* 12.5*  ?HCT 41.7 40.7 40.5  ?PLT 287 282 278  ?LABPROT 26.7* 24.2* 21.8*  ?INR 2.5* 2.2* 1.9*  ?CREATININE 0.86 0.88 0.98  ? ? ? ?Estimated Creatinine Clearance: 73 mL/min (by C-G formula based on SCr of 0.98 mg/dL). ? ? ?Medical History: ?Past Medical History:  ?Diagnosis Date  ? Acute on chronic respiratory failure ([place])   ? a. 08/2014 in setting of PE.  ? Ankylosing spondylitis ([place])   ? Bilateral pulmonary embolism ([facility])   ? a. 08/2014 - started on Coumadin. Retrievable IVC filter placed 08/27/14 due to RV strain and large clot burden.  ? CAD (coronary artery disease)   ? a. stenting of LCx 2013; b. STEMI 06/12/14 s/p PCI to LAD complicated by post cath shock requiring IABP; VT s/p DCCV, EF 20%; c. NSTEMI 06/26/14 treated medically.  ? Carotid artery disease ([place])   ? a. s/p stenting.  ? Chronic systolic CHF (congestive heart failure) ([place])   ? Diabetes ([place])   ? Hemoptysis   ? a. 08/2014 possibly due to pulm infarct/PE.  ? Hyperlipidemia   ? Hypertension   ? Hypotension   ? Ischemic cardiomyopathy   ? a. echo 08/23/2014 EF <20%, dilated CM, mod MR/TR  ? Leukocytosis   ? Leukocytosis 09/10/2014  ? Nephrolithiasis   ? Pleural effusion on right 08/2014 - small  ? Reactive thrombocytosis 09/10/2014  ? Right leg DVT ([place])   ? a. 08/2014.  ? Ventricular tachycardia ([place])   ? a. 06/2014 at time of MI, s/p DCCV.   ? ?Assessment: ?54 yoM with HM2 LVAD admitted with VT. Pt is onw arfarin PTA with INR >8 on admit. Hgb down 13.3 > 11.4, plt stable. LDH stable. Pharmacy to start heparin once INR <1.8. ? ?INR now below goal at 1.9. CBC and LDH stable overnight. Patient now s/p ICD placement yesterday.  ? ?Discussed anticoagulation with EP/HF team this morning. Ok to resume warfarin tonight, will have to reassess starting heparin in am depending on INR.  ? ?Plan:  ?Warfarin '6mg'$  tonight ?Daily INR ? ?Erin Hearing PharmD., BCPS ?Clinical Pharmacist ?11/15/2021 7:45 AM ? ?

## 2021-11-15 NOTE — Progress Notes (Addendum)
Patient ID: Scott [name], male   DOB: [birth year], 76 y.o.   MRN: [id number] ?  ?Advanced Heart Failure VAD Team Note ? ?PCP-Cardiologist: None  ? ?Subjective:   ? ?5/12: VT with DCCV, CHB after DCCV.  Amiodarone stopped and dopamine begun. ?5/14 Dopamine weaned to 2 mcg.   ?5/15 Recurrent VT -> DC-CV ?5/16 Recurrent VT-->DC-CV/ SP Dual Chamber MDT ICD  ? ?Feels ok. Pacing well.  ? ? ?LVAD INTERROGATION:  ?HeartMate 2 LVAD:   ?Flow 4.4 liters/min, speed 9200, power 5.0. PI 5.6  ? ?Objective:   ? ?Vital Signs:   ?Temp:  [97.4 ?F (36.3 ?C)-98.4 ?F (36.9 ?C)] 97.7 ?F (36.5 ?C) (05/17 0400) ?Pulse Rate:  [0-91] 80 (05/17 0700) ?Resp:  [7-33] 21 (05/17 0700) ?BP: (41-130)/(29-109) 104/93 (05/17 0700) ?SpO2:  [90 %-100 %] 96 % (05/17 0700) ?Weight:  [92.3 kg] 92.3 kg (05/17 0450) ?Last BM Date : 11/11/21 ?Mean arterial Pressure 90s  ? ?Intake/Output:  ? ?Intake/Output Summary (Last 24 hours) at 11/15/2021 0726 ?Last data filed at 11/15/2021 0700 ?Gross per 24 hour  ?Intake 958.53 ml  ?Output 800 ml  ?Net 158.53 ml  ?  ? ?Physical Exam  ?  ? ?GENERAL: No acute distress. Sittign in the chair.  ?HEENT: normal  ?NECK: Supple, JVP  5-6.  2+ bilaterally, no bruits.  No lymphadenopathy or thyromegaly appreciated.   ?CARDIAC:  Mechanical heart sounds with LVAD hum present. L upper chest steri strips/dressing.  ?LUNGS:  Clear to auscultation bilaterally.  ?ABDOMEN:  Soft, round, nontender, positive bowel sounds x4.     ?LVAD exit site: well-healed and incorporated.  Dressing dry and intact.  No erythema or drainage.  Stabilization device present and accurately applied.  Driveline dressing is being changed daily per sterile technique. ?EXTREMITIES:  Warm and dry, no cyanosis, clubbing, rash or edema  ?NEUROLOGIC:  Alert and oriented x 3.    No aphasia.  No dysarthria.  Affect pleasant.    ? ? ?Telemetry  ? ? ?AV paced intermittently in the 80s  ? ?Labs  ? ?Basic Metabolic Panel: ?Recent Labs  ?Lab 11/10/21 ?1001 11/10/21 ?1608  11/11/21 ?0305 11/12/21 ?0814 11/13/21 ?4818 11/14/21 ?0255 11/15/21 ?0602  ?NA 141   < > 136 135 136 136 136  ?K 5.8*   < > 4.4 4.6 4.3 4.4 3.8  ?CL 108   < > 106 104 104 105 103  ?CO2 24   < > '24 25 27 25 26  '$ ?GLUCOSE 133*   < > 116* 128* 85 74 149*  ?BUN 32*   < > 25* '21 16 15 14  '$ ?CREATININE 1.69*   < > 1.14 1.05 0.86 0.88 0.98  ?CALCIUM 8.9   < > 8.7* 8.7* 8.7* 8.2* 8.2*  ?MG 2.1  --   --   --   --   --  1.7  ? < > = values in this interval not displayed.  ? ? ?Liver Function Tests: ?Recent Labs  ?Lab 11/10/21 ?1001 11/14/21 ?0255  ?AST 53* 33  ?ALT 108* 52*  ?ALKPHOS 140* 121  ?BILITOT 2.2* 1.7*  ?PROT 7.1 6.2*  ?ALBUMIN 3.5 2.9*  ? ?No results for input(s): LIPASE, AMYLASE in the last 168 hours. ?No results for input(s): AMMONIA in the last 168 hours. ? ?CBC: ?Recent Labs  ?Lab 11/10/21 ?1001 11/11/21 ?0305 11/12/21 ?5631 11/13/21 ?4970 11/14/21 ?0255 11/15/21 ?0602  ?WBC 12.7* 13.3* 11.4* 10.4 9.1 10.3  ?NEUTROABS 11.0*  --   --   --   --   --   ?  HGB 13.6 13.2 12.8* 13.2 12.6* 12.5*  ?HCT 43.6 41.4 41.0 41.7 40.7 40.5  ?MCV 95.2 93.9 95.3 94.8 96.4 96.2  ?PLT 340 327 284 287 282 278  ? ? ?INR: ?Recent Labs  ?Lab 11/11/21 ?0305 11/12/21 ?8563 11/13/21 ?1497 11/14/21 ?0255 11/15/21 ?0602  ?INR 2.6* 2.6* 2.5* 2.2* 1.9*  ? ? ?Other results: ? ? ? ?Imaging  ? ?DG Chest 2 View ? ?Result Date: 11/15/2021 ?CLINICAL DATA:  Cardiac device in situ EXAM: CHEST - 2 VIEW COMPARISON:  10/16/2021 FINDINGS: Interval placement of left subclavian AICD, leads extending towards the right atrium and right ventricle. No pneumothorax. Relatively low lung volumes with probable bilateral pleural effusions, new since previous. Patchy airspace opacities in the right mid lung and at the left lung base, new since previous. LVAD partially visualized. Stable cardiomegaly.  Aortic Atherosclerosis (ICD10-170.0). Sternotomy wires. IMPRESSION: 1. Interval left subclavian AICD placement without pneumothorax. 2. New bilateral pleural effusions,  right mid lung and left basilar airspace disease. Electronically Signed   By: Lucrezia Europe M.D.   On: 11/15/2021 06:55  ? ?EP PPM/ICD IMPLANT ? ?Result Date: 11/14/2021 ?SURGEON: Will Curt Bears, MD PREPROCEDURE DIAGNOSES: 1.  Ischemic cardiomyopathy. 2. New York Heart Association class III, heart failure chronically. 3.  Complete heart block 4.  Ventricular tachycardia/fibrillation POSTPROCEDURE DIAGNOSES: 1.  Ischemic cardiomyopathy. 2. New York Heart Association class III heart failure chronically. 3.  Complete heart block 4.  Ventricular tachycardia/fibrillation PROCEDURES: 1. Biventricular ICD implantation. INTRODUCTION:  Scott [name] is a 76 y.o. male with a ischemic  CM (EF less than 20% %), NYHA Class III CHF, and LBBB QRS morophology.  He presented to the hospital with VT storm and complete heart block.  He presents for ICD implantation. DESCRIPTION OF PROCEDURE: Informed written consent was obtained and the patient was brought to the electrophysiology lab in the fasting state. The patient was adequately sedated with intravenous Versed, and fentanyl as outlined in the nursing report. The patient's left chest was prepped and draped in the usual sterile fashion by the EP lab staff. The skin overlying the left deltopectoral region was infiltrated with lidocaine for local analgesia. A 5-cm incision was made over the left deltopectoral region. A left subcutaneous defibrillator pocket was fashioned using a combination of sharp and blunt dissection. Electrocautery was used to assure hemostasis. RA/RV Lead Placement: The left axillary vein was cannulated with fluoroscopic visualization. No contrast was required for this endeavor. Through the left axillary vein, a Medtronic model [id number] (serial #[id number]) right atrial lead and a Medtronic model [id number] (serial number [id number]) right ventricular defibrillator lead were advanced with fluoroscopic visualization into the right atrial appendage and right  ventricular apex positions respectively. Initial atrial lead P-waves measured 2.4 mV with an impedance of 704 ohms and a threshold of 0.8 volts at 0.5 milliseconds. The right ventricular lead R-wave measured 8.6 mV with impedance of 614 ohms and a threshold of 0.7 volts at 0.5 milliseconds. Left bundle Lead Placement: A Medtronic left bundle guide was advanced through the left axillary vein into the right ventricle.  A Medtronic model [id number] (serial number [id number] ) lead was advanced into the left bundle position.  In this location, the left ventricular lead R-waves measured 3 mV with impedance of 705 ohms and a threshold of 1 volt at 0.5 milliseconds. The left bundle guide was therefore removed. All three leads were secured to the pectoralis fascia using #2 silk suture over the suture sleeves. The pocket then  irrigated with copious gentamicin solution. The leads were then connected to a Medtronic model [employer] CRT-D MRI SureScan (serial Number [id number]) device. The defibrillator was placed into the pocket. The pocket was then closed in 3 layers with 2.0 Vicryl suture for the subcutaneous and 3.0 Vicryl suture subcuticular layers. Steri-Strips and a sterile dressing were then applied. DFT testing was not performed today. The procedure was therefore considered completed. EBL<71m. There were no early apparhent complications. CONCLUSIONS: 1.  Ischemic cardiomyopathy 2.  Complete heart block 3.  Ventricular tachycardia/fibrillation 4. Successful biventricular ICD implantation. 5. No early apparent complications.   ? ? ?Medications:   ? ? ?Scheduled Medications: ? aspirin EC  81 mg Oral Daily  ? cephALEXin  500 mg Oral BID  ? pantoprazole  40 mg Oral Daily  ? potassium chloride  40 mEq Oral Once  ? rosuvastatin  10 mg Oral Once per day on Mon Wed Fri  ? sodium chloride flush  3 mL Intravenous Q12H  ? ? ?Infusions: ? sodium chloride 10 mL/hr at 11/15/21 0700  ? amiodarone 30 mg/hr (11/15/21 0700)  ?  ceFAZolin  (ANCEF) IV Stopped (11/15/21 0341)  ? DOPamine Stopped (11/13/21 1949)  ? magnesium sulfate bolus IVPB    ? ? ?PRN Medications: ?sodium chloride, acetaminophen, cyclobenzaprine, fentaNYL (SUBLIMAZE) injection, o

## 2021-11-15 NOTE — Progress Notes (Addendum)
? ?Electrophysiology Rounding Note ? ?Patient Name: Scott [name] ?Date of Encounter: 11/15/2021 ? ?Primary Cardiologist: Dr. Haroldine Laws  ?Electrophysiologist: Dr. Curt Bears ? ? ?Subjective  ? ?NAEO. Stable CXR. Can already tell a difference in how he feels. ? ?Inpatient Medications  ?  ?Scheduled Meds: ? aspirin EC  81 mg Oral Daily  ? cephALEXin  500 mg Oral BID  ? pantoprazole  40 mg Oral Daily  ? rosuvastatin  10 mg Oral Once per day on Mon Wed Fri  ? sodium chloride flush  3 mL Intravenous Q12H  ? ?Continuous Infusions: ? sodium chloride 10 mL/hr at 11/15/21 0700  ? amiodarone 30 mg/hr (11/15/21 0700)  ?  ceFAZolin (ANCEF) IV Stopped (11/15/21 0341)  ? DOPamine Stopped (11/13/21 1949)  ? ?PRN Meds: ?sodium chloride, acetaminophen, fentaNYL (SUBLIMAZE) injection, ondansetron (ZOFRAN) IV, sodium chloride flush, traMADol  ? ?Vital Signs  ?  ?Vitals:  ? 11/15/21 0400 11/15/21 0450 11/15/21 0603 11/15/21 0700  ?BP: (!) 111/97  91/69 (!) 104/93  ?Pulse: 80  81 80  ?Resp: (!) 31  (!) 28 (!) 21  ?Temp: 97.7 ?F (36.5 ?C)     ?TempSrc: Oral     ?SpO2: 96%  94% 96%  ?Weight:  92.3 kg    ?Height:      ? ? ?Intake/Output Summary (Last 24 hours) at 11/15/2021 0714 ?Last data filed at 11/15/2021 0700 ?Gross per 24 hour  ?Intake 958.53 ml  ?Output 800 ml  ?Net 158.53 ml  ? ?Filed Weights  ? 11/13/21 0800 11/14/21 0500 11/15/21 0450  ?Weight: 90.4 kg 90.4 kg 92.3 kg  ? ? ?Physical Exam  ?  ?GEN- The patient is well appearing, alert and oriented x 3 today.   ?Head- normocephalic, atraumatic ?Eyes-  Sclera clear, conjunctiva pink ?Ears- hearing intact ?Oropharynx- clear ?Neck- supple ?Lungs- Clear to ausculation bilaterally, normal work of breathing ?Heart- Regular rate and rhythm, no murmurs, rubs or gallops ?GI- soft, NT, ND, + BS ?Extremities- no clubbing or cyanosis. No edema ?Skin- no rash or lesion ?Psych- euthymic mood, full affect ?Neuro- strength and sensation are intact ? ?Labs  ?  ?CBC ?Recent Labs  ?  11/14/21 ?0255  11/15/21 ?0602  ?WBC 9.1 10.3  ?HGB 12.6* 12.5*  ?HCT 40.7 40.5  ?MCV 96.4 96.2  ?PLT 282 278  ? ?Basic Metabolic Panel ?Recent Labs  ?  11/14/21 ?0255 11/15/21 ?0602  ?NA 136 136  ?K 4.4 3.8  ?CL 105 103  ?CO2 25 26  ?GLUCOSE 74 149*  ?BUN 15 14  ?CREATININE 0.88 0.98  ?CALCIUM 8.2* 8.2*  ?MG  --  1.7  ? ?Liver Function Tests ?Recent Labs  ?  11/14/21 ?0255  ?AST 33  ?ALT 52*  ?ALKPHOS 121  ?BILITOT 1.7*  ?PROT 6.2*  ?ALBUMIN 2.9*  ? ?No results for input(s): LIPASE, AMYLASE in the last 72 hours. ?Cardiac Enzymes ?No results for input(s): CKTOTAL, CKMB, CKMBINDEX, TROPONINI in the last 72 hours. ? ? ?Telemetry  ?  ?AV dual paced most of the night, 80 bpm (personally reviewed) ? ?Radiology  ?  ?DG Chest 2 View ? ?Result Date: 11/15/2021 ?CLINICAL DATA:  Cardiac device in situ EXAM: CHEST - 2 VIEW COMPARISON:  10/16/2021 FINDINGS: Interval placement of left subclavian AICD, leads extending towards the right atrium and right ventricle. No pneumothorax. Relatively low lung volumes with probable bilateral pleural effusions, new since previous. Patchy airspace opacities in the right mid lung and at the left lung base, new since previous. LVAD  partially visualized. Stable cardiomegaly.  Aortic Atherosclerosis (ICD10-170.0). Sternotomy wires. IMPRESSION: 1. Interval left subclavian AICD placement without pneumothorax. 2. New bilateral pleural effusions, right mid lung and left basilar airspace disease. Electronically Signed   By: Lucrezia Europe M.D.   On: 11/15/2021 06:55  ? ?EP PPM/ICD IMPLANT ? ?Result Date: 11/14/2021 ?SURGEON: Lovell Nuttall Curt Bears, MD PREPROCEDURE DIAGNOSES: 1.  Ischemic cardiomyopathy. 2. New York Heart Association class III, heart failure chronically. 3.  Complete heart block 4.  Ventricular tachycardia/fibrillation POSTPROCEDURE DIAGNOSES: 1.  Ischemic cardiomyopathy. 2. New York Heart Association class III heart failure chronically. 3.  Complete heart block 4.  Ventricular tachycardia/fibrillation PROCEDURES: 1.  Biventricular ICD implantation. INTRODUCTION:  [name] is a 76 y.o. male with a ischemic  CM (EF less than 20% %), NYHA Class III CHF, and LBBB QRS morophology.  He presented to the hospital with VT storm and complete heart block.  He presents for ICD implantation. DESCRIPTION OF PROCEDURE: Informed written consent was obtained and the patient was brought to the electrophysiology lab in the fasting state. The patient was adequately sedated with intravenous Versed, and fentanyl as outlined in the nursing report. The patient's left chest was prepped and draped in the usual sterile fashion by the EP lab staff. The skin overlying the left deltopectoral region was infiltrated with lidocaine for local analgesia. A 5-cm incision was made over the left deltopectoral region. A left subcutaneous defibrillator pocket was fashioned using a combination of sharp and blunt dissection. Electrocautery was used to assure hemostasis. RA/RV Lead Placement: The left axillary vein was cannulated with fluoroscopic visualization. No contrast was required for this endeavor. Through the left axillary vein, a Medtronic model [id number] (serial #PJN ADM 700 V) right atrial lead and a Medtronic model [id number] (serial number [id number]) right ventricular defibrillator lead were advanced with fluoroscopic visualization into the right atrial appendage and right ventricular apex positions respectively. Initial atrial lead P-waves measured 2.4 mV with an impedance of 704 ohms and a threshold of 0.8 volts at 0.5 milliseconds. The right ventricular lead R-wave measured 8.6 mV with impedance of 614 ohms and a threshold of 0.7 volts at 0.5 milliseconds. Left bundle Lead Placement: A Medtronic left bundle guide was advanced through the left axillary vein into the right ventricle.  A Medtronic model [id number] (serial number [id number] ) lead was advanced into the left bundle position.  In this location, the left ventricular lead R-waves measured 3 mV  with impedance of 705 ohms and a threshold of 1 volt at 0.5 milliseconds. The left bundle guide was therefore removed. All three leads were secured to the pectoralis fascia using #2 silk suture over the suture sleeves. The pocket then irrigated with copious gentamicin solution. The leads were then connected to a Medtronic model [employer] CRT-D MRI SureScan (serial Number [id number]) device. The defibrillator was placed into the pocket. The pocket was then closed in 3 layers with 2.0 Vicryl suture for the subcutaneous and 3.0 Vicryl suture subcuticular layers. Steri-Strips and a sterile dressing were then applied. DFT testing was not performed today. The procedure was therefore considered completed. EBL<65m. There were no early apparhent complications. CONCLUSIONS: 1.  Ischemic cardiomyopathy 2.  Complete heart block 3.  Ventricular tachycardia/fibrillation 4. Successful biventricular ICD implantation. 5. No early apparent complications.   ? ?Patient Profile  ?   ?DJOEZIAH VOITis a 76y.o. male with a hx of CAD anterior STEMI with VT arrest in 12/15, large PE with pulmonary infarct in  2/15 s/p IVC filter, severe ICM > HMII LVAD (09/21/2014), ankylosig spondylitis, DM, , PVD s/p carotid stent,  who was seen 11/10/2021 for the evaluation of recurrent VT at the request of Dr. Haroldine Laws. ? ?Assessment & Plan  ?  ?VT/VF ?S/p Dual chamber MDT ICD with Left bundle pacing 11/14/2021 ?No further overnight ?Continue IV amiodarone for load.  ?Wound care and arm restrictions reviewed and placed in AVS.  ?Usual follow up Chijioke Lasser be in place ?  ?2. Intermittent CHB ?Now s/p DDD ICD  ?  ?3. Chronic systolic CHF ?4. LVAD ?AHF team managing.  ? ? ?For questions or updates, please contact [place] ?Please consult [email or web address] for contact info under Cardiology/STEMI. ? ?Signed, ?Shirley Friar, PA-C  ?11/15/2021, 7:14 AM  ? ?I have seen and examined this patient with Oda Kilts.  Agree with above, note added to  reflect my findings.  Patient feeling much improved post ICD implant.  No further ventricular arrhythmias. ? ?GEN: Well nourished, well developed, in no acute distress  ?HEENT: normal  ?Neck: no JVD, ca

## 2021-11-15 NOTE — Discharge Instructions (Signed)
After Your ICD ?(Implantable Cardiac Defibrillator) ? ? ?You have a Medtronic ICD ? ?ACTIVITY ?Do not lift your arm above shoulder height for 1 week after your procedure. After 7 days, you may progress as below.  ?You should remove your sling 24 hours after your procedure, unless otherwise instructed by your provider.  ? ? ? Wednesday Nov 22, 2021  Thursday Nov 23, 2021 Friday Nov 24, 2021 Saturday Nov 25, 2021  ? ?Do not lift, push, pull, or carry anything over 10 pounds with the affected arm until 6 weeks (Wednesday December 27, 2021 ) after your procedure.  ? ?You may drive AFTER your wound check, unless you have been told otherwise by your provider.  ? ?Ask your healthcare provider when you can go back to work ? ? ?INCISION/Dressing ?If you are on a blood thinner such as Coumadin, Xarelto, Eliquis, Plavix, or Pradaxa please confirm with your provider when this should be resumed.  ? ?If large square, outer bandage is left in place, this can be removed after 24 hours from your procedure. Do not remove steri-strips or glue as below.  ? ?Monitor your defibrillator site for redness, swelling, and drainage. Call the device clinic at [phone] if you experience these symptoms or fever/chills. ? ?If your incision is sealed with Steri-strips or staples, you may shower 7 days after your procedure or when told by your provider. Do not remove the steri-strips or let the shower hit directly on your site. You may wash around your site with soap and water.   ? ?If you were discharged in a sling, please do not wear this during the day more than 48 hours after your surgery unless otherwise instructed. This may increase the risk of stiffness and soreness in your shoulder.  ? ?Avoid lotions, ointments, or perfumes over your incision until it is well-healed. ? ?You may use a hot tub or a pool AFTER your wound check appointment if the incision is completely closed. ? ?Your ICD is designed to protect you from life threatening heart  rhythms. Because of this, you may receive a shock.  ? ?1 shock with no symptoms:  Call the office during business hours. ?1 shock with symptoms (chest pain, chest pressure, dizziness, lightheadedness, shortness of breath, overall feeling unwell):  Call 911. ?If you experience 2 or more shocks in 24 hours:  Call 911. ?If you receive a shock, you should not drive for 6 months per the [place] DMV IF you receive appropriate therapy from your ICD.  ? ?ICD Alerts:  Some alerts are vibratory and others beep. These are NOT emergencies. Please call our office to let us know. If this occurs at night or on weekends, it can wait until the next business day. Send a remote transmission. ? ?If your device is capable of reading fluid status (for heart failure), you will be offered monthly monitoring to review this with you.  ? ?DEVICE MANAGEMENT ?Remote monitoring is used to monitor your ICD from home. This monitoring is scheduled every 91 days by our office. It allows [place] to keep an eye on the functioning of your device to ensure it is working properly. You will routinely see your Electrophysiologist annually (more often if necessary).  ? ?You should receive your ID card for your new device in 4-8 weeks. Keep this card with you at all times once received. Consider wearing a medical alert bracelet or necklace. ? ?Your ICD  may be MRI compatible. This will be discussed at your next  office visit/wound check.  You should avoid contact with strong electric or magnetic fields.  ? ?Do not use amateur (ham) radio equipment or electric (arc) welding torches. MP3 player headphones with magnets should not be used. Some devices are safe to use if held at least 12 inches (30 cm) from your defibrillator. These include power tools, lawn mowers, and speakers. If you are unsure if something is safe to use, ask your health care provider. ? ?When using your cell phone, hold it to the ear that is on the opposite side from the defibrillator. Do not leave  your cell phone in a pocket over the defibrillator. ? ?You may safely use electric blankets, heating pads, computers, and microwave ovens. ? ?Call the office right away if: ?You have chest pain. ?You feel more than one shock. ?You feel more short of breath than you have felt before. ?You feel more light-headed than you have felt before. ?Your incision starts to open up. ? ?This information is not intended to replace advice given to you by your health care provider. Make sure you discuss any questions you have with your health care provider. ? ? ?  ?

## 2021-11-15 NOTE — Progress Notes (Signed)
LVAD Coordinator Rounding Note: ? ?Admitted 11/10/21 to Dr. Haroldine Laws due to VT.  ? ?HM II LVAD implanted on 09/21/14 by Dr. Darcey Nora under Destination Therapy criteria. ? ?Patient up in chair at bedside with wife sitting beside him. He is awake and alert, and denies complaints.  ? ?S/p dual chamber MDT ICD placement yesterday. AV paced 80. Dressing clean, dry, intact. Small amount of bruising noted at site, no hematoma present. Pt aware of restrictions per EP.  ? ?Remains on IV Amiodarone after episodes of VT/VF yesterday. Will continue IV Amiodarone load at least 2 days per Dr Curt Bears. Dopamine stopped this morning.  ? ?Vital signs: ?Temp: 97.7 ?HR: AV paced 80 ?Doppler Pressure: 80 ?Automatic BP:  100/85 (86) ?O2 Sat: 94% on RA ?Wt: 196>197>197>199>199>203.4 lbs   ? ?LVAD interrogation reveals:  ?Speed: 9200 ?Flow: 5.0 ?Power:  5.5w ?PI: 4.8 ? ?Alarms: none ?Events: none ? ?Fixed speed: 9200 ?Low speed limit: 8600 ? ? ?Drive Line:  Dressing clean, dry, and intact. Anchor correctly applied. Maintained every 3 days by patient's wife at home. Wife says she wants to do daily dressing changes at this time. Order changed to daily dressing changes using daily kit, cleansing with saline only. Dressing change per bedside RN or patient's wife [name]. Dressing change due 11/16/21.  ? ?Labs:  ?LDH trend:437>395>393>380>345>330 ? ?INR trend: 8.2>4.1>2.6>2.6>2.5>2.2>1.9 ? ?Anticoagulation Plan: ?-INR Goal:  2.0 - 3.0 ?-ASA Dose: 81 mg daily ? ?Blood Products:  ?- 11/10/21>> one unit FFP ? ?Device: N/A ? ?Arrythmias:  ?- 5/12//23 admitted with VT>>emergent DC-CV  ?- 11/10/21 complete heart block/bradycardia >>started on Dopamine gtt ?- 5/15 Recurrent VT -> DC-CV ?- 5/16 Recurrent VT-->DC-CV/ SP Dual Chamber MDT ICD  ? ?Drips: ?Amiodarone 30 mg/hr ?Dopamine -- stopped 11/15/21 ? ?Plan/Recommendations:  ?Call VAD pager if any issues with VAD equipment or drive line site. ?Daily drive line dressing changes per pt's wife or bedside  RN. ? ?Emerson Monte RN ?VAD Coordinator  ?Office: [phone]  ?24/7 Pager: [phone]  ? ?  ? ?

## 2021-11-16 DIAGNOSIS — Z95811 Presence of heart assist device: Secondary | ICD-10-CM | POA: Diagnosis not present

## 2021-11-16 DIAGNOSIS — E875 Hyperkalemia: Secondary | ICD-10-CM | POA: Diagnosis not present

## 2021-11-16 DIAGNOSIS — I472 Ventricular tachycardia, unspecified: Secondary | ICD-10-CM | POA: Diagnosis not present

## 2021-11-16 DIAGNOSIS — I5022 Chronic systolic (congestive) heart failure: Secondary | ICD-10-CM | POA: Diagnosis not present

## 2021-11-16 LAB — BASIC METABOLIC PANEL
Anion gap: 4 — ABNORMAL LOW (ref 5–15)
BUN: 11 mg/dL (ref 8–23)
CO2: 29 mmol/L (ref 22–32)
Calcium: 7.9 mg/dL — ABNORMAL LOW (ref 8.9–10.3)
Chloride: 103 mmol/L (ref 98–111)
Creatinine, Ser: 0.88 mg/dL (ref 0.61–1.24)
GFR, Estimated: >60 mL/min (ref >60)
Glucose, Bld: 96 mg/dL (ref 70–99)
Potassium: 3.9 mmol/L (ref 3.5–5.1)
Sodium: 136 mmol/L (ref 135–145)

## 2021-11-16 LAB — CBC
HCT: 40.3 % (ref 39.0–52.0)
Hemoglobin: 12.5 g/dL — ABNORMAL LOW (ref 13.0–17.0)
MCH: 29.5 pg (ref 26.0–34.0)
MCHC: 31 g/dL (ref 30.0–36.0)
MCV: 95 fL (ref 80.0–100.0)
Platelets: 263 10*3/uL (ref 150–400)
RBC: 4.24 MIL/uL (ref 4.22–5.81)
RDW: 19.5 % — ABNORMAL HIGH (ref 11.5–15.5)
WBC: 10.6 10*3/uL — ABNORMAL HIGH (ref 4.0–10.5)
nRBC: 0 % (ref 0.0–0.2)

## 2021-11-16 LAB — MAGNESIUM: Magnesium: 2 mg/dL (ref 1.7–2.4)

## 2021-11-16 LAB — PROTIME-INR
INR: 2 — ABNORMAL HIGH (ref 0.8–1.2)
Prothrombin Time: 22.2 seconds — ABNORMAL HIGH (ref 11.4–15.2)

## 2021-11-16 LAB — LACTATE DEHYDROGENASE: LDH: 337 U/L — ABNORMAL HIGH (ref 98–192)

## 2021-11-16 MED ORDER — FUROSEMIDE 20 MG PO TABS: 40.0000 mg | ORAL_TABLET | Freq: Every day | ORAL | 3 refills | Status: DC

## 2021-11-16 MED ORDER — POTASSIUM CHLORIDE CRYS ER 20 MEQ PO TBCR
40.0000 meq | EXTENDED_RELEASE_TABLET | Freq: Once | ORAL | Status: AC
  Administered 2021-11-16: 40 meq via ORAL
  Filled 2021-11-16: qty 2

## 2021-11-16 MED ORDER — WARFARIN SODIUM 6 MG PO TABS: ORAL_TABLET | ORAL | 1 refills | Status: DC

## 2021-11-16 MED ORDER — AMIODARONE HCL 200 MG PO TABS
400.0000 mg | ORAL_TABLET | Freq: Two times a day (BID) | ORAL | Status: DC
  Administered 2021-11-16: 400 mg via ORAL
  Filled 2021-11-16: qty 2

## 2021-11-16 MED ORDER — FUROSEMIDE 10 MG/ML IJ SOLN
20.0000 mg | Freq: Once | INTRAMUSCULAR | Status: AC
  Administered 2021-11-16: 20 mg via INTRAVENOUS
  Filled 2021-11-16: qty 2

## 2021-11-16 MED ORDER — CLOPIDOGREL BISULFATE 75 MG PO TABS: 75.0000 mg | ORAL_TABLET | Freq: Every day | ORAL | 3 refills | Status: DC

## 2021-11-16 NOTE — Progress Notes (Addendum)
Electrophysiology Rounding Note  Patient Name: Scott [name] Date of Encounter: 11/16/2021  Primary Cardiologist: Dr. Haroldine Laws  Electrophysiologist: Dr. Curt Bears   Subjective   NAEO. No further VT. Feels great and wants to go home.  Inpatient Medications    Scheduled Meds:  aspirin EC  81 mg Oral Daily   cephALEXin  500 mg Oral BID   pantoprazole  40 mg Oral Daily   rosuvastatin  10 mg Oral Once per day on Mon Wed Fri   sodium chloride flush  3 mL Intravenous Q12H   Warfarin - Pharmacist Dosing Inpatient   Does not apply q1600   Continuous Infusions:  sodium chloride Stopped (11/15/21 0812)   amiodarone 30 mg/hr (11/16/21 0600)   PRN Meds: sodium chloride, acetaminophen, cyclobenzaprine, fentaNYL (SUBLIMAZE) injection, ondansetron (ZOFRAN) IV, sodium chloride flush, traMADol   Vital Signs    Vitals:   11/15/21 1625 11/15/21 1928 11/15/21 2325 11/16/21 0403  BP: (!) 123/99 103/88 98/86 (!) 102/91  Pulse:  80 81 81  Resp: (!) 28 (!) '27 20 20  '$ Temp:  97.7 F (36.5 C) 98.2 F (36.8 C) 97.7 F (36.5 C)  TempSrc:  Oral Oral Oral  SpO2:  97% 96% 94%  Weight:    91.4 kg  Height:        Intake/Output Summary (Last 24 hours) at 11/16/2021 0752 Last data filed at 11/16/2021 0600 Gross per 24 hour  Intake 462.32 ml  Output 1550 ml  Net -1087.68 ml   Filed Weights   11/14/21 0500 11/15/21 0450 11/16/21 0403  Weight: 90.4 kg 92.3 kg 91.4 kg    Physical Exam    GEN- The patient is well appearing, alert and oriented x 3 today.   Head- normocephalic, atraumatic Eyes-  Sclera clear, conjunctiva pink Ears- hearing intact Oropharynx- clear Neck- supple Lungs- Clear to ausculation bilaterally, normal work of breathing Heart- Regular rate and rhythm, no murmurs, rubs or gallops GI- soft, NT, ND, + BS Extremities- no clubbing or cyanosis. No edema Skin- no rash or lesion Psych- euthymic mood, full affect Neuro- strength and sensation are intact  Labs     CBC Recent Labs    11/15/21 0602 11/16/21 0119  WBC 10.3 10.6*  HGB 12.5* 12.5*  HCT 40.5 40.3  MCV 96.2 95.0  PLT 278 831   Basic Metabolic Panel Recent Labs    11/15/21 0602 11/16/21 0119  NA 136 136  K 3.8 3.9  CL 103 103  CO2 26 29  GLUCOSE 149* 96  BUN 14 11  CREATININE 0.98 0.88  CALCIUM 8.2* 7.9*  MG 1.7 2.0   Liver Function Tests Recent Labs    11/14/21 0255  AST 33  ALT 52*  ALKPHOS 121  BILITOT 1.7*  PROT 6.2*  ALBUMIN 2.9*   No results for input(s): LIPASE, AMYLASE in the last 72 hours. Cardiac Enzymes No results for input(s): CKTOTAL, CKMB, CKMBINDEX, TROPONINI in the last 72 hours.   Telemetry    AV dual pacing 80s (personally reviewed)  Radiology    DG Chest 2 View  Result Date: 11/15/2021 CLINICAL DATA:  Cardiac device in situ EXAM: CHEST - 2 VIEW COMPARISON:  10/16/2021 FINDINGS: Interval placement of left subclavian AICD, leads extending towards the right atrium and right ventricle. No pneumothorax. Relatively low lung volumes with probable bilateral pleural effusions, new since previous. Patchy airspace opacities in the right mid lung and at the left lung base, new since previous. LVAD partially visualized. Stable cardiomegaly.  Aortic  Atherosclerosis (ICD10-170.0). Sternotomy wires. IMPRESSION: 1. Interval left subclavian AICD placement without pneumothorax. 2. New bilateral pleural effusions, right mid lung and left basilar airspace disease. Electronically Signed   By: Lucrezia Europe M.D.   On: 11/15/2021 06:55   EP PPM/ICD IMPLANT  Result Date: 11/14/2021 SURGEON: Allegra Lai, MD PREPROCEDURE DIAGNOSES: 1.  Ischemic cardiomyopathy. 2. New York Heart Association class III, heart failure chronically. 3.  Complete heart block 4.  Ventricular tachycardia/fibrillation POSTPROCEDURE DIAGNOSES: 1.  Ischemic cardiomyopathy. 2. New York Heart Association class III heart failure chronically. 3.  Complete heart block 4.  Ventricular  tachycardia/fibrillation PROCEDURES: 1. Biventricular ICD implantation. INTRODUCTION:  Scott [name] is a 76 y.o. male with a ischemic  CM (EF less than 20% %), NYHA Class III CHF, and LBBB QRS morophology.  He presented to the hospital with VT storm and complete heart block.  He presents for ICD implantation. DESCRIPTION OF PROCEDURE: Informed written consent was obtained and the patient was brought to the electrophysiology lab in the fasting state. The patient was adequately sedated with intravenous Versed, and fentanyl as outlined in the nursing report. The patient's left chest was prepped and draped in the usual sterile fashion by the EP lab staff. The skin overlying the left deltopectoral region was infiltrated with lidocaine for local analgesia. A 5-cm incision was made over the left deltopectoral region. A left subcutaneous defibrillator pocket was fashioned using a combination of sharp and blunt dissection. Electrocautery was used to assure hemostasis. RA/RV Lead Placement: The left axillary vein was cannulated with fluoroscopic visualization. No contrast was required for this endeavor. Through the left axillary vein, a Medtronic model [id number] (serial #PJN ADM 700 V) right atrial lead and a Medtronic model [id number] (serial number [id number]) right ventricular defibrillator lead were advanced with fluoroscopic visualization into the right atrial appendage and right ventricular apex positions respectively. Initial atrial lead P-waves measured 2.4 mV with an impedance of 704 ohms and a threshold of 0.8 volts at 0.5 milliseconds. The right ventricular lead R-wave measured 8.6 mV with impedance of 614 ohms and a threshold of 0.7 volts at 0.5 milliseconds. Left bundle Lead Placement: A Medtronic left bundle guide was advanced through the left axillary vein into the right ventricle.  A Medtronic model [id number] (serial number [id number] ) lead was advanced into the left bundle position.  In this location, the left  ventricular lead R-waves measured 3 mV with impedance of 705 ohms and a threshold of 1 volt at 0.5 milliseconds. The left bundle guide was therefore removed. All three leads were secured to the pectoralis fascia using #2 silk suture over the suture sleeves. The pocket then irrigated with copious gentamicin solution. The leads were then connected to a Medtronic model [employer] CRT-D MRI SureScan (serial Number [id number]) device. The defibrillator was placed into the pocket. The pocket was then closed in 3 layers with 2.0 Vicryl suture for the subcutaneous and 3.0 Vicryl suture subcuticular layers. Steri-Strips and a sterile dressing were then applied. DFT testing was not performed today. The procedure was therefore considered completed. EBL<57m. There were no early apparhent complications. CONCLUSIONS: 1.  Ischemic cardiomyopathy 2.  Complete heart block 3.  Ventricular tachycardia/fibrillation 4. Successful biventricular ICD implantation. 5. No early apparent complications.    Patient Profile     DABRON NEDDOis a 76y.o. male with a hx of CAD anterior STEMI with VT arrest in 12/15, large PE with pulmonary infarct in 2/15 s/p IVC filter, severe ICM >  HMII LVAD (09/21/2014), ankylosig spondylitis, DM, , PVD s/p carotid stent,  who was seen 11/10/2021 for the evaluation of recurrent VT at the request of Dr. Haroldine Laws.  Assessment & Plan    VT/VF S/p Dual chamber MDT ICD with Left bundle pacing 11/14/2021 No further overnight Pt anxious to go home. As VT is quiescent, Criss Bartles transition to amiodarone 400 mg BID and taper.  Wound care and arm restrictions reviewed and placed in AVS.  Usual follow up is in place   2. Intermittent CHB Now s/p DDD ICD    3. Chronic systolic CHF 4. LVAD AHF team managing.  Dr. Curt Bears has seen. Pt currently only has 1 shock set in VF zone for rates greater than 250 bpm. Gurnoor Ursua discuss with HF team if additional desired at this time.   For questions or updates,  please contact [place] Please consult [email or web address] for contact info under Cardiology/STEMI.  Signed, Shirley Friar, PA-C  11/16/2021, 7:52 AM   I have seen and examined this patient with Oda Kilts.  Agree with above, note added to reflect my findings.  Patient feeling well.  No further arrhythmias overnight.  GEN: Well nourished, well developed, in no acute distress  HEENT: normal  Neck: no JVD, carotid bruits, or masses Cardiac: RRR; no murmurs, rubs, or gallops,no edema  Respiratory:  clear to auscultation bilaterally, normal work of breathing GI: soft, nontender, nondistended, + BS MS: no deformity or atrophy  Skin: warm and dry, device site well healed Neuro:  Strength and sensation are intact Psych: euthymic mood, full affect   Ventricular tachycardia: Status post Medtronic ICD with atrial and left bundle lead.  Has fortunately required no VT therapy.  We Benett Swoyer reduce his VF zone to 230 bpm.  We Lorie Melichar continue his current bag of IV amiodarone and then can switch him to oral amiodarone. Complete heart block: Status post ICD as above.  Device functioning appropriately. Chronic systolic heart failure: Patient post LVAD.  Managing per primary team.  Riannah Stagner M. Sencere Symonette MD 11/16/2021 9:36 AM

## 2021-11-16 NOTE — Care Management Important Message (Signed)
Important Message  Patient Details  Name: Scott [name] MRN: [id number] Date of Birth: 1946/06/04   Medicare Important Message Given:  Yes  Patient left prior to IM delivery will mail to the patient home address.    Ruthe Roemer 11/16/2021, 12:42 PM

## 2021-11-16 NOTE — Progress Notes (Signed)
[place] for warfarin Indication:  LVAD  Allergies  Allergen Reactions   Desyrel [Trazodone] Other (See Comments)    insomnia   Other Dermatitis    Adhesive   Biopatch [Chlorhexidine] Dermatitis and Rash    Patient Measurements: Height: '5\' 8"'$  (172.7 cm) Weight: 91.4 kg (201 lb 8 oz) IBW/kg (Calculated) : 68.4 Heparin Dosing Weight: 84kg  Vital Signs: Temp: 98.2 F (36.8 C) (05/18 0753) Temp Source: Oral (05/18 0403) BP: 102/91 (05/18 0403) Pulse Rate: 81 (05/18 0403)  Labs: Recent Labs    11/14/21 0255 11/15/21 0602 11/16/21 0119  HGB 12.6* 12.5* 12.5*  HCT 40.7 40.5 40.3  PLT 282 278 263  LABPROT 24.2* 21.8* 22.2*  INR 2.2* 1.9* 2.0*  CREATININE 0.88 0.98 0.88     Estimated Creatinine Clearance: 80.8 mL/min (by C-G formula based on SCr of 0.88 mg/dL).   Medical History: Past Medical History:  Diagnosis Date   Acute on chronic respiratory failure ([place])    a. 08/2014 in setting of PE.   Ankylosing spondylitis ([place])    Bilateral pulmonary embolism ([place])    a. 08/2014 - started on Coumadin. Retrievable IVC filter placed 08/27/14 due to RV strain and large clot burden.   CAD (coronary artery disease)    a. stenting of LCx 2013; b. STEMI 06/12/14 s/p PCI to LAD complicated by post cath shock requiring IABP; VT s/p DCCV, EF 20%; c. NSTEMI 06/26/14 treated medically.   Carotid artery disease ([place])    a. s/p stenting.   Chronic systolic CHF (congestive heart failure) ([place])    Diabetes ([place])    Hemoptysis    a. 08/2014 possibly due to pulm infarct/PE.   Hyperlipidemia    Hypertension    Hypotension    Ischemic cardiomyopathy    a. echo 08/23/2014 EF <20%, dilated CM, mod MR/TR   Leukocytosis    Leukocytosis 09/10/2014   Nephrolithiasis    Pleural effusion on right 08/2014 - small   Reactive thrombocytosis 09/10/2014   Right leg DVT ([place])    a. 08/2014.   Ventricular tachycardia ([place])    a. 06/2014 at time of MI, s/p DCCV.    Assessment: 64 yoM with HM2 LVAD admitted with VT. Pt is onw arfarin PTA with INR >8 on admit.   INR 2.0 this am after starting back warfarin last night.  CBC and LDH stable overnight. Patient now s/p ICD placement 5/16.    Plan:  Discharge plan discussed with outpatient LVAD pharmacist and patient/wife  Will discharge on amiodarone 400 bid for at least another week Warfarin '6mg'$  on TuThSa and '3mg'$  all other days Appt next Tuesday 5/23  Erin Hearing PharmD., BCPS Clinical Pharmacist 11/16/2021 10:01 AM

## 2021-11-16 NOTE — Progress Notes (Signed)
Adjusted patients VT/VF zones.   ATP only for HRs > 207 ATP during charging and shock x 1 for HRs > 231 bpm  [employer]" [place], [place]  11/16/2021 10:16 AM

## 2021-11-16 NOTE — Progress Notes (Addendum)
Patient ID: Scott [name], male   DOB: 07/05/1945, 76 y.o.   MRN: [id number]   Advanced Heart Failure VAD Team Note  PCP-Cardiologist: None   Subjective:    5/12: VT with DCCV, CHB after DCCV.  Amiodarone stopped and dopamine begun. 5/14 Dopamine weaned to 2 mcg.   5/15 Recurrent VT -> DC-CV 5/16 Recurrent VT-->DC-CV/ SP Dual Chamber MDT ICD   No further VT on tele.   Feels well. OOB sitting up in chair. No complaints. Denies dyspnea. Wants to go home.   INR 2.0   Wife at bedside    LVAD INTERROGATION:  HeartMate 2 LVAD:   Flow 5.0 liters/min, speed 9200, power 5.4. PI 5.1   Objective:    Vital Signs:   Temp:  [97.7 F (36.5 C)-98.2 F (36.8 C)] 98.2 F (36.8 C) (05/18 0753) Pulse Rate:  [80-81] 81 (05/18 0403) Resp:  [20-28] 20 (05/18 0753) BP: (98-123)/(86-99) 102/91 (05/18 0403) SpO2:  [94 %-97 %] 94 % (05/18 0403) Weight:  [91.4 kg] 91.4 kg (05/18 0403) Last BM Date : 11/11/21 Mean arterial Pressure 92  Intake/Output:   Intake/Output Summary (Last 24 hours) at 11/16/2021 0909 Last data filed at 11/16/2021 0600 Gross per 24 hour  Intake 377.57 ml  Output 1550 ml  Net -1172.43 ml     Physical Exam    GENERAL: Well appearing, sitting up in chair. NAD  HEENT: normal  NECK: Supple, JVP 8 cm  2+ bilaterally, no bruits.  No lymphadenopathy or thyromegaly appreciated.   CARDIAC:  Mechanical heart sounds with LVAD hum present. L upper chest steri strips/dressing.  LUNGS:  CTAB, no wheezing  ABDOMEN:  Soft, round, nontender, positive bowel sounds x4.     LVAD exit site: well-healed and incorporated.  Dressing dry and intact.  No erythema or drainage.  Stabilization device present and accurately applied.  Driveline dressing is being changed daily per sterile technique. EXTREMITIES:  Warm and dry, no cyanosis, clubbing, rash or 1+ b/l pretibial and ankle edema  NEUROLOGIC:  Alert and oriented x 3.    No aphasia.  No dysarthria.  Affect pleasant.     Telemetry     AV paced intermittently in the 80s, no further VT   Labs   Basic Metabolic Panel: Recent Labs  Lab 11/10/21 1001 11/10/21 1608 11/12/21 0117 11/13/21 0417 11/14/21 0255 11/15/21 0602 11/16/21 0119  NA 141   < > 135 136 136 136 136  K 5.8*   < > 4.6 4.3 4.4 3.8 3.9  CL 108   < > 104 104 105 103 103  CO2 24   < > '25 27 25 26 29  '$ GLUCOSE 133*   < > 128* 85 74 149* 96  BUN 32*   < > '21 16 15 14 11  '$ CREATININE 1.69*   < > 1.05 0.86 0.88 0.98 0.88  CALCIUM 8.9   < > 8.7* 8.7* 8.2* 8.2* 7.9*  MG 2.1  --   --   --   --  1.7 2.0   < > = values in this interval not displayed.    Liver Function Tests: Recent Labs  Lab 11/10/21 1001 11/14/21 0255  AST 53* 33  ALT 108* 52*  ALKPHOS 140* 121  BILITOT 2.2* 1.7*  PROT 7.1 6.2*  ALBUMIN 3.5 2.9*   No results for input(s): LIPASE, AMYLASE in the last 168 hours. No results for input(s): AMMONIA in the last 168 hours.  CBC: Recent Labs  Lab 11/10/21  1001 11/11/21 0305 11/12/21 0117 11/13/21 0417 11/14/21 0255 11/15/21 0602 11/16/21 0119  WBC 12.7*   < > 11.4* 10.4 9.1 10.3 10.6*  NEUTROABS 11.0*  --   --   --   --   --   --   HGB 13.6   < > 12.8* 13.2 12.6* 12.5* 12.5*  HCT 43.6   < > 41.0 41.7 40.7 40.5 40.3  MCV 95.2   < > 95.3 94.8 96.4 96.2 95.0  PLT 340   < > 284 287 282 278 263   < > = values in this interval not displayed.    INR: Recent Labs  Lab 11/12/21 0117 11/13/21 0417 11/14/21 0255 11/15/21 0602 11/16/21 0119  INR 2.6* 2.5* 2.2* 1.9* 2.0*    Other results:    Imaging   DG Chest 2 View  Result Date: 11/15/2021 CLINICAL DATA:  Cardiac device in situ EXAM: CHEST - 2 VIEW COMPARISON:  10/16/2021 FINDINGS: Interval placement of left subclavian AICD, leads extending towards the right atrium and right ventricle. No pneumothorax. Relatively low lung volumes with probable bilateral pleural effusions, new since previous. Patchy airspace opacities in the right mid lung and at the left lung base, new  since previous. LVAD partially visualized. Stable cardiomegaly.  Aortic Atherosclerosis (ICD10-170.0). Sternotomy wires. IMPRESSION: 1. Interval left subclavian AICD placement without pneumothorax. 2. New bilateral pleural effusions, right mid lung and left basilar airspace disease. Electronically Signed   By: Lucrezia Europe M.D.   On: 11/15/2021 06:55   EP PPM/ICD IMPLANT  Result Date: 11/14/2021 SURGEON: Allegra Lai, MD PREPROCEDURE DIAGNOSES: 1.  Ischemic cardiomyopathy. 2. New [place] Heart Association class III, heart failure chronically. 3.  Complete heart block 4.  Ventricular tachycardia/fibrillation POSTPROCEDURE DIAGNOSES: 1.  Ischemic cardiomyopathy. 2. New York Heart Association class III heart failure chronically. 3.  Complete heart block 4.  Ventricular tachycardia/fibrillation PROCEDURES: 1. Biventricular ICD implantation. INTRODUCTION:  Scott [name] is a 76 y.o. male with a ischemic  CM (EF less than 20% %), NYHA Class III CHF, and LBBB QRS morophology.  He presented to the hospital with VT storm and complete heart block.  He presents for ICD implantation. DESCRIPTION OF PROCEDURE: Informed written consent was obtained and the patient was brought to the electrophysiology lab in the fasting state. The patient was adequately sedated with intravenous Versed, and fentanyl as outlined in the nursing report. The patient's left chest was prepped and draped in the usual sterile fashion by the EP lab staff. The skin overlying the left deltopectoral region was infiltrated with lidocaine for local analgesia. A 5-cm incision was made over the left deltopectoral region. A left subcutaneous defibrillator pocket was fashioned using a combination of sharp and blunt dissection. Electrocautery was used to assure hemostasis. RA/RV Lead Placement: The left axillary vein was cannulated with fluoroscopic visualization. No contrast was required for this endeavor. Through the left axillary vein, a Medtronic model  [id number] (serial #[id number]) right atrial lead and a Medtronic model [id number] (serial number [id number]) right ventricular defibrillator lead were advanced with fluoroscopic visualization into the right atrial appendage and right ventricular apex positions respectively. Initial atrial lead P-waves measured 2.4 mV with an impedance of 704 ohms and a threshold of 0.8 volts at 0.5 milliseconds. The right ventricular lead R-wave measured 8.6 mV with impedance of 614 ohms and a threshold of 0.7 volts at 0.5 milliseconds. Left bundle Lead Placement: A Medtronic left bundle guide was advanced through the left axillary vein  into the right ventricle.  A Medtronic model [id number] (serial number [id number] ) lead was advanced into the left bundle position.  In this location, the left ventricular lead R-waves measured 3 mV with impedance of 705 ohms and a threshold of 1 volt at 0.5 milliseconds. The left bundle guide was therefore removed. All three leads were secured to the pectoralis fascia using #2 silk suture over the suture sleeves. The pocket then irrigated with copious gentamicin solution. The leads were then connected to a Medtronic model [employer] CRT-D MRI SureScan (serial Number [id number]) device. The defibrillator was placed into the pocket. The pocket was then closed in 3 layers with 2.0 Vicryl suture for the subcutaneous and 3.0 Vicryl suture subcuticular layers. Steri-Strips and a sterile dressing were then applied. DFT testing was not performed today. The procedure was therefore considered completed. EBL<28m. There were no early apparhent complications. CONCLUSIONS: 1.  Ischemic cardiomyopathy 2.  Complete heart block 3.  Ventricular tachycardia/fibrillation 4. Successful biventricular ICD implantation. 5. No early apparent complications.     Medications:     Scheduled Medications:  amiodarone  400 mg Oral BID   aspirin EC  81 mg Oral Daily   cephALEXin  500 mg Oral BID   pantoprazole  40 mg Oral  Daily   rosuvastatin  10 mg Oral Once per day on Mon Wed Fri   sodium chloride flush  3 mL Intravenous Q12H   Warfarin - Pharmacist Dosing Inpatient   Does not apply q1600    Infusions:  sodium chloride Stopped (11/15/21 0812)   amiodarone 30 mg/hr (11/16/21 0600)    PRN Medications: sodium chloride, acetaminophen, cyclobenzaprine, fentaNYL (SUBLIMAZE) injection, ondansetron (ZOFRAN) IV, sodium chloride flush, traMADol   Assessment/Plan:    1. VT: Recently admitted with VT @ 290 bpm. Cardioversion x1. Loaded on amiodarone.  - Presented 5/12 in recurrent VT. Urgent cardioversion 200J x1---> CHB.  - Repeat VT 5/15 -> DC-CV - Repeat DC-CV 5/16 -->DC-CV  - S/P Dual Chamber MDT ICD  - Loaded w/ IV amio. EP transitioning to PO 400 mg bid today  - Keep K > 4.0 Mg > 2.0  - No driving for 6 months.  - Restrictions discussed by EP.   2. CHB: Post-DCCV.  - S/P Dual Chamber ICD. A-V paced    3. Chronic HFrEF with HMII LVAD, ICM.  -  Echo LVEF < 20% RV severely HK. Septum midline. - Mild fluid overload on exam. IV Lasix 20 mg x 1 + 40 mEq of KCl   - no ARB/BB.  - Renal function stable   4. LVAD HMII--> Destination Therapy Implanted 2015 - No further low flows/PI events post-DCCV from VT despite CHB.  - Follow daily LDH, INR.  - LDH 337 - INR 2.0. Coumadin tonight. No heparin - On keflex for chronic DL infection prophylaxis.    5. Hyperkalemia  - Resolved.    6. AKI on CKD Stage II - Creatinine on admit 1.7  - Resolved.     I reviewed the LVAD parameters from today, and compared the results to the patient's prior recorded data.  No programming changes were made.  The LVAD is functioning within specified parameters.  The patient performs LVAD self-test daily.  LVAD interrogation was negative for any significant power changes, alarms or PI events/speed drops.  LVAD equipment check completed and is in good working order.  Back-up equipment present.   LVAD education done on emergency  procedures and precautions and reviewed exit site  care.   Length of Stay: [address] [place] 11/16/2021, 9:09 AM  VAD Team --- VAD ISSUES ONLY--- Pager [phone] (7am - 7am)  Advanced Heart Failure Team  Pager [phone] (M-F; 7a - 5p)  Please contact [facility] for night-coverage after hours (5p -7a ) and weekends on [email or web address]  Patient seen and examined with the above-signed Advanced Practice Provider and/or Housestaff. I personally reviewed laboratory data, imaging studies and relevant notes. I independently examined the patient and formulated the important aspects of the plan. I have edited the note to reflect any of my changes or salient points. I have personally discussed the plan with the patient and/or family.  Doing well. Pacing. No further VT. Diuresed well with IV lasix yesterday.   Denies SOB, orthopnea or PND.  INR 2.0  General:  NAD.  HEENT: normal  Neck: supple. JVP not elevated.  Carotids 2+ bilat; no bruits. No lymphadenopathy or thryomegaly appreciated. Cor: LVAD hum.  Lungs: Clear. Abdomen:soft, nontender, non-distended. No hepatosplenomegaly. No bruits or masses. Good bowel sounds. Driveline site clean. Anchor in place.  Extremities: no cyanosis, clubbing, rash. Warm no edema  Neuro: alert & oriented x 3. No focal deficits. Moves all 4 without problem   Looks good today. D/w EP and VT/VF therapy zones adjusted. VAD interrogated personally. Parameters stable.  Discussed warfarin dosing with PharmD personally.  [place] for d/c today.  [name], MD  11:23 AM

## 2021-11-16 NOTE — Progress Notes (Signed)
LVAD Coordinator Rounding Note:  Admitted 11/10/21 to Dr. Haroldine Laws due to VT.   HM II LVAD implanted on 09/21/14 by Dr. Darcey Nora under Destination Therapy criteria.  Patient lying in bed watching TV. He is awake and alert, and denies complaints.   AV paced 80. Left pectoral dressing clean, dry, intact with small soft hematoma under dressing.   Dr Curt Bears and Oda Kilts, PA in room. Pt wanting to go home, discussed with EP team.    Vital signs: Temp: 98.2 HR: AV paced 80 Doppler Pressure: 92 Automatic BP:  102/91 (96) O2 Sat: 94% on RA Wt: 196>197>197>199>199>203.4>201.5 lbs    LVAD interrogation reveals:  Speed: 9200 Flow: 5.0 Power:  5.4w PI: 5.1  Alarms: none Events: none  Fixed speed: 9200 Low speed limit: 8600   Drive Line:  Dressing clean, dry, and intact. Anchor correctly applied. Wife says she wants to do daily dressing changes at this time. Daily dressing changes using daily kit, cleansing with saline only. Dressing change per bedside RN or patient's wife [name]. Dressing change due 11/16/21.   Labs:  LDH trend:437>395>393>380>345>330>337  INR trend: 8.2>4.1>2.6>2.6>2.5>2.2>1.9>2.0  Anticoagulation Plan: -INR Goal:  2.0 - 3.0 -ASA Dose: 81 mg daily  Blood Products:  - 11/10/21>> one unit FFP  Device: N/A  Arrythmias:  - 5/12//23 admitted with VT>>emergent DC-CV  - 11/10/21 complete heart block/bradycardia >>started on Dopamine gtt - 5/15 Recurrent VT -> DC-CV - 5/16 Recurrent VT-->DC-CV/ SP Dual Chamber MDT ICD   Drips: Amiodarone 30 mg/hr   Plan/Recommendations:  Call VAD pager if any issues with VAD equipment or drive line site. Daily drive line dressing changes per pt's wife or bedside RN.  Zada Girt RN [place] Coordinator  Office: [phone]  24/7 Pager: [phone]

## 2021-11-16 NOTE — Discharge Summary (Addendum)
Advanced Heart Failure Team  Discharge Summary   Patient ID: Scott [name] MRN: [id number], DOB/AGE: [birth year] 76 y.o. Admit date: 11/10/2021 D/C date:     11/16/2021   Primary Discharge Diagnoses:  1. VT/VF 2. Complete Heart Block 3. Chronic HFrEF with HMII LVAD 4.LVAD HMII 5. Hyperkalemia  6. AKI on CKD Stage II  Hospital Course:   Scott [name] is a 76 y/o male with h/o CAD with ankylosing spondylitis, DM2, carotid stenting, severe ischemic CM s/p anterior STEM with VT arrest in 12/15, large PE with pulmonary infarct in 2/15 s/p IVC filter and HMII LVAD.    Admitted 10/30/21 with VT. Underwent emergent DC-CV with restoration SR. Loaded on amiodarone. Developed lactic acidosis and cardiogenic shock as a result of VT. Required inotrope support with milrinone which was successfully weaned. Diuresed with IV lasix. Had AKI with recovery in renal function prior to discharge. Discharged on amio 400 mg twice a dat.    Admitted with recurrent VT. Given IV fluids. Underwent shock x1 complicated by CHB and hypotension. On admit INR supra therapeutic. Given FFP and vitamin K. Placed on dopamine with gradual improvement. Dopamine later weaned off. Remained in CHB. EP consulted. Had dual chamber ICD placed. After implant he was placed on amiodarone drip and transitioned to amio 400 mg twice a day.   VAD parameters followed. LDH/INR stable at discharge. See below for detailed d/c. He will continue to be followed closely in the VAD clinic. He has follow up next week in the VAD clinic.   1. VT: Recently admitted with VT @ 290 bpm. Cardioversion x1. Loaded on amiodarone.  - Presented 5/12 in recurrent VT. Urgent cardioversion 200J x1---> CHB.  - Repeat VT 5/15 -> DC-CV - Repeat DC-CV 5/16 -->DC-CV  - S/P Dual Chamber MDT ICD  - Post implant placed on IV amiodarone then transitioned to amio 400 mg twice a day.  - Keep K > 4.0 Mg > 2.0  - No driving for 6 months.  - Restrictions discussed by EP.    2.  CHB: Post-DCCV.  He remains in CHB today, HR around 40.  MAP stable, creatinine down to 1.05.  No complaints, no dizziness.  No low flow alarms on LVAD interrogation.  - Monitor for now. Cut back  dopamine to 1 mcg.     - S/P Dual Chamber ICD -He has follow up with EP.    3. Chronic HFrEF with HMII LVAD, ICM.  -  Echo LVEF < 20% RV severely HK. Septum midline. - Given dose of IV lasix prior to d/c. Tomorrow he will start lasix 40 mg daily.  - no ARB/BB.  - Renal function stable   4. LVAD HMII--> Destination Therapy Implanted 2015 - No further low flows/PI events post-DCCV from VT despite CHB.  - Follow daily LDH, INR.  - LDH followed closely and remained stable.  - INR 2. Pharmacy dosed coumadin. INR check next week.  - On keflex for chronic DL infection prophylaxis.    5. Hyperkalemia  - Resolved.    6. AKI on CKD Stage II - Creatinine on admit 1.7  - Resolved.   LVAD Interrogation HM II:   Speed: 9200    Flow: 5     PI: 4.8     Power:   5    Back-up speed:       Discharge Vitals: Blood pressure (!) 102/91, pulse 81, temperature 98.2 F (36.8 C), resp. rate 20, height '5\' 8"'$  (1.727 m), weight 91.4  kg, SpO2 94 %.  Labs: Lab Results  Component Value Date   WBC 10.6 (H) 11/16/2021   HGB 12.5 (L) 11/16/2021   HCT 40.3 11/16/2021   MCV 95.0 11/16/2021   PLT 263 11/16/2021    Recent Labs  Lab 11/14/21 0255 11/15/21 0602 11/16/21 0119  NA 136   < > 136  K 4.4   < > 3.9  CL 105   < > 103  CO2 25   < > 29  BUN 15   < > 11  CREATININE 0.88   < > 0.88  CALCIUM 8.2*   < > 7.9*  PROT 6.2*  --   --   BILITOT 1.7*  --   --   ALKPHOS 121  --   --   ALT 52*  --   --   AST 33  --   --   GLUCOSE 74   < > 96   < > = values in this interval not displayed.   Lab Results  Component Value Date   CHOL 210 (H) 03/15/2021   HDL 41 03/15/2021   LDLCALC 160 (H) 03/15/2021   TRIG 43 03/15/2021   BNP (last 3 results) No results for input(s): BNP in the last 8760 hours.  ProBNP  (last 3 results) No results for input(s): PROBNP in the last 8760 hours.   Diagnostic Studies/Procedures   DG Chest 2 View  Result Date: 11/15/2021 CLINICAL DATA:  Cardiac device in situ EXAM: CHEST - 2 VIEW COMPARISON:  10/16/2021 FINDINGS: Interval placement of left subclavian AICD, leads extending towards the right atrium and right ventricle. No pneumothorax. Relatively low lung volumes with probable bilateral pleural effusions, new since previous. Patchy airspace opacities in the right mid lung and at the left lung base, new since previous. LVAD partially visualized. Stable cardiomegaly.  Aortic Atherosclerosis (ICD10-170.0). Sternotomy wires. IMPRESSION: 1. Interval left subclavian AICD placement without pneumothorax. 2. New bilateral pleural effusions, right mid lung and left basilar airspace disease. Electronically Signed   By: Lucrezia Europe M.D.   On: 11/15/2021 06:55   EP PPM/ICD IMPLANT  Result Date: 11/14/2021 SURGEON: Allegra Lai, MD PREPROCEDURE DIAGNOSES: 1.  Ischemic cardiomyopathy. 2. New York Heart Association class III, heart failure chronically. 3.  Complete heart block 4.  Ventricular tachycardia/fibrillation POSTPROCEDURE DIAGNOSES: 1.  Ischemic cardiomyopathy. 2. New York Heart Association class III heart failure chronically. 3.  Complete heart block 4.  Ventricular tachycardia/fibrillation PROCEDURES: 1. Biventricular ICD implantation. INTRODUCTION:  Scott [name] is a 76 y.o. male with a ischemic  CM (EF less than 20% %), NYHA Class III CHF, and LBBB QRS morophology.  He presented to the hospital with VT storm and complete heart block.  He presents for ICD implantation. DESCRIPTION OF PROCEDURE: Informed written consent was obtained and the patient was brought to the electrophysiology lab in the fasting state. The patient was adequately sedated with intravenous Versed, and fentanyl as outlined in the nursing report. The patient's left chest was prepped and draped in the usual  sterile fashion by the EP lab staff. The skin overlying the left deltopectoral region was infiltrated with lidocaine for local analgesia. A 5-cm incision was made over the left deltopectoral region. A left subcutaneous defibrillator pocket was fashioned using a combination of sharp and blunt dissection. Electrocautery was used to assure hemostasis. RA/RV Lead Placement: The left axillary vein was cannulated with fluoroscopic visualization. No contrast was required for this endeavor. Through the left axillary vein, a Medtronic model  5076-52 (serial #[id number]) right atrial lead and a Medtronic model [id number] (serial number [id number]) right ventricular defibrillator lead were advanced with fluoroscopic visualization into the right atrial appendage and right ventricular apex positions respectively. Initial atrial lead P-waves measured 2.4 mV with an impedance of 704 ohms and a threshold of 0.8 volts at 0.5 milliseconds. The right ventricular lead R-wave measured 8.6 mV with impedance of 614 ohms and a threshold of 0.7 volts at 0.5 milliseconds. Left bundle Lead Placement: A Medtronic left bundle guide was advanced through the left axillary vein into the right ventricle.  A Medtronic model [id number] (serial number [id number] ) lead was advanced into the left bundle position.  In this location, the left ventricular lead R-waves measured 3 mV with impedance of 705 ohms and a threshold of 1 volt at 0.5 milliseconds. The left bundle guide was therefore removed. All three leads were secured to the pectoralis fascia using #2 silk suture over the suture sleeves. The pocket then irrigated with copious gentamicin solution. The leads were then connected to a Medtronic model [employer] CRT-D MRI SureScan (serial Number [id number]) device. The defibrillator was placed into the pocket. The pocket was then closed in 3 layers with 2.0 Vicryl suture for the subcutaneous and 3.0 Vicryl suture subcuticular layers. Steri-Strips and a  sterile dressing were then applied. DFT testing was not performed today. The procedure was therefore considered completed. EBL<4m. There were no early apparhent complications. CONCLUSIONS: 1.  Ischemic cardiomyopathy 2.  Complete heart block 3.  Ventricular tachycardia/fibrillation 4. Successful biventricular ICD implantation. 5. No early apparent complications.    Discharge Medications   Allergies as of 11/16/2021       Reactions   Desyrel [trazodone] Other (See Comments)   insomnia   Other Dermatitis   Adhesive   Biopatch [chlorhexidine] Dermatitis, Rash        Medication List     TAKE these medications    acetaminophen 500 MG tablet Commonly known as: TYLENOL Take 1,000 mg by mouth every 6 (six) hours as needed for moderate pain.   amiodarone 200 MG tablet Commonly known as: PACERONE Take 2 tablets (400 mg total) by mouth 2 (two) times daily.   aspirin EC 81 MG tablet Take 1 tablet (81 mg total) by mouth daily.   cephALEXin 500 MG capsule Commonly known as: Keflex Take 1 capsule (500 mg total) by mouth 2 (two) times daily.   clobetasol ointment 0.05 % Commonly known as: TEMOVATE Apply 1 application topically 2 (two) times daily.   clopidogrel 75 MG tablet Commonly known as: PLAVIX Take 1 tablet (75 mg total) by mouth daily. Start taking on: Nov 19, 2021 What changed: These instructions start on Nov 19, 2021. If you are unsure what to do until then, ask your doctor or other care provider.   cyclobenzaprine 5 MG tablet Commonly known as: FLEXERIL Take 1 tablet (5 mg total) by mouth 3 (three) times daily as needed for muscle spasms.   furosemide 20 MG tablet Commonly known as: LASIX Take 2 tablets (40 mg total) by mouth daily. Take 40 mg daily Start taking on: Nov 17, 2021 What changed:  how much to take additional instructions   pantoprazole 40 MG tablet Commonly known as: PROTONIX Take 1 tablet (40 mg total) by mouth at bedtime. What changed: when to  take this   potassium chloride SA 20 MEQ tablet Commonly known as: KLOR-CON M Every other day alternate 20 mEq (1 tablet) with  40 mEq (2 tablets) What changed:  how much to take when to take this additional instructions   rosuvastatin 10 MG tablet Commonly known as: CRESTOR Take 10 mg by mouth every Monday, Wednesday, and Friday.   traMADol 50 MG tablet Commonly known as: ULTRAM Take 2 tablets (100 mg total) by mouth every 6 (six) hours as needed for moderate pain. What changed:  how much to take when to take this   traZODone 100 MG tablet Commonly known as: DESYREL Take 2 tablets (200 mg total) by mouth at bedtime.   warfarin 6 MG tablet Commonly known as: Jantoven Take as directed. If you are unsure how to take this medication, talk to your nurse or doctor. Original instructions: TAKE 1 tab Tues, Thus, Sat and 1/2 tab all other days What changed: additional instructions               Discharge Care Instructions  (From admission, onward)           Start     Ordered   11/16/21 0000  Discharge wound care:       Comments: Keep L uppper chest dressing dry. Do not remove steri strips   11/16/21 1028            Disposition   The patient will be discharged in stable condition to home. Discharge Instructions     (HEART FAILURE PATIENTS) Call MD:  Anytime you have any of the following symptoms: 1) 3 pound weight gain in 24 hours or 5 pounds in 1 week 2) shortness of breath, with or without a dry hacking cough 3) swelling in the hands, feet or stomach 4) if you have to sleep on extra pillows at night in order to breathe.   Complete by: As directed    Diet - low sodium heart healthy   Complete by: As directed    Discharge wound care:   Complete by: As directed    Keep L uppper chest dressing dry. Do not remove steri strips   Driving Restrictions   Complete by: As directed    By [place] law no  driving 6 months due to VT   Increase activity slowly   Complete  by: As directed    Page VAD Coordinator at [phone]  Notify for: any VAD alarms, sustained elevations of power >10 watts, sustained drop in Pulse Index <3   Complete by: As directed    Notify for:  any VAD alarms sustained elevations of power >10 watts sustained drop in Pulse Index <3     Speed Settings:   Complete by: As directed    Fixed 9200 RPM Low 8600 RPM       Follow-up Information     [facility] up on 11/21/2021.   Specialty: Cardiology Why: at 900. VAD Clinic Contact information: [address] [id number] [place] [phone]                  Duration of Discharge Encounter: Greater than 35 minutes   Signed, Darrick Grinder NP-C  11/16/2021, 10:28 AM  Patient seen and examined with the above-signed Advanced Practice Provider and/or Housestaff. I personally reviewed laboratory data, imaging studies and relevant notes. I independently examined the patient and formulated the important aspects of the plan. I have edited the note to reflect any of my changes or salient points. I have personally discussed the plan with the patient and/or family.  Much  improved with biV pacing. No further VT. ICD zones programmed. Weed for d/c.  [name], MD  4:41 PM

## 2021-11-20 ENCOUNTER — Other Ambulatory Visit (HOSPITAL_COMMUNITY): Payer: Self-pay | Admitting: *Deleted

## 2021-11-20 DIAGNOSIS — Z7901 Long term (current) use of anticoagulants: Secondary | ICD-10-CM

## 2021-11-20 DIAGNOSIS — Z95811 Presence of heart assist device: Secondary | ICD-10-CM

## 2021-11-20 DIAGNOSIS — Z79899 Other long term (current) drug therapy: Secondary | ICD-10-CM

## 2021-11-21 ENCOUNTER — Ambulatory Visit (HOSPITAL_COMMUNITY)
Admit: 2021-11-21 | Discharge: 2021-11-21 | Disposition: A | Discharge status: Home / Self Care | Payer: Medicare Other | Attending: Cardiology | Admitting: Cardiology

## 2021-11-21 ENCOUNTER — Ambulatory Visit (HOSPITAL_COMMUNITY): Payer: Self-pay | Admitting: Pharmacist

## 2021-11-21 ENCOUNTER — Encounter (HOSPITAL_COMMUNITY): Payer: Self-pay

## 2021-11-21 VITALS — BP 105/76 | HR 80 | Temp 97.1°F | Ht 68.0 in | Wt 195.8 lb

## 2021-11-21 DIAGNOSIS — I255 Ischemic cardiomyopathy: Secondary | ICD-10-CM | POA: Diagnosis not present

## 2021-11-21 DIAGNOSIS — M459 Ankylosing spondylitis of unspecified sites in spine: Secondary | ICD-10-CM | POA: Insufficient documentation

## 2021-11-21 DIAGNOSIS — I5022 Chronic systolic (congestive) heart failure: Secondary | ICD-10-CM | POA: Diagnosis not present

## 2021-11-21 DIAGNOSIS — I252 Old myocardial infarction: Secondary | ICD-10-CM | POA: Insufficient documentation

## 2021-11-21 DIAGNOSIS — N200 Calculus of kidney: Secondary | ICD-10-CM | POA: Insufficient documentation

## 2021-11-21 DIAGNOSIS — Z95811 Presence of heart assist device: Secondary | ICD-10-CM | POA: Insufficient documentation

## 2021-11-21 DIAGNOSIS — Z8674 Personal history of sudden cardiac arrest: Secondary | ICD-10-CM | POA: Diagnosis not present

## 2021-11-21 DIAGNOSIS — Z79899 Other long term (current) drug therapy: Secondary | ICD-10-CM | POA: Insufficient documentation

## 2021-11-21 DIAGNOSIS — I2510 Atherosclerotic heart disease of native coronary artery without angina pectoris: Secondary | ICD-10-CM | POA: Diagnosis not present

## 2021-11-21 DIAGNOSIS — I110 Hypertensive heart disease with heart failure: Secondary | ICD-10-CM | POA: Insufficient documentation

## 2021-11-21 DIAGNOSIS — I472 Ventricular tachycardia, unspecified: Secondary | ICD-10-CM

## 2021-11-21 DIAGNOSIS — Z955 Presence of coronary angioplasty implant and graft: Secondary | ICD-10-CM | POA: Insufficient documentation

## 2021-11-21 DIAGNOSIS — I447 Left bundle-branch block, unspecified: Secondary | ICD-10-CM | POA: Insufficient documentation

## 2021-11-21 DIAGNOSIS — Z7901 Long term (current) use of anticoagulants: Secondary | ICD-10-CM

## 2021-11-21 DIAGNOSIS — T827XXA Infection and inflammatory reaction due to other cardiac and vascular devices, implants and grafts, initial encounter: Secondary | ICD-10-CM

## 2021-11-21 LAB — BASIC METABOLIC PANEL
Anion gap: 6 (ref 5–15)
BUN: 19 mg/dL (ref 8–23)
CO2: 31 mmol/L (ref 22–32)
Calcium: 9.3 mg/dL (ref 8.9–10.3)
Chloride: 103 mmol/L (ref 98–111)
Creatinine, Ser: 1.15 mg/dL (ref 0.61–1.24)
GFR, Estimated: >60 mL/min (ref >60)
Glucose, Bld: 107 mg/dL — ABNORMAL HIGH (ref 70–99)
Potassium: 4.4 mmol/L (ref 3.5–5.1)
Sodium: 140 mmol/L (ref 135–145)

## 2021-11-21 LAB — PROTIME-INR
INR: 2.6 — ABNORMAL HIGH (ref 0.8–1.2)
Prothrombin Time: 27.3 seconds — ABNORMAL HIGH (ref 11.4–15.2)

## 2021-11-21 LAB — MAGNESIUM: Magnesium: 2.2 mg/dL (ref 1.7–2.4)

## 2021-11-21 LAB — LACTATE DEHYDROGENASE: LDH: 353 U/L — ABNORMAL HIGH (ref 98–192)

## 2021-11-21 LAB — TSH: TSH: 9.182 u[IU]/mL — ABNORMAL HIGH (ref 0.350–4.500)

## 2021-11-21 MED ORDER — AMIODARONE HCL 200 MG PO TABS: 200.0000 mg | ORAL_TABLET | Freq: Two times a day (BID) | ORAL | 1 refills | Status: DC

## 2021-11-21 MED ORDER — CEPHALEXIN 500 MG PO CAPS: 500.0000 mg | ORAL_CAPSULE | Freq: Two times a day (BID) | ORAL | 3 refills | Status: DC

## 2021-11-21 NOTE — Patient Instructions (Addendum)
Decrease Amiodarone to 200 mg twice daily. Take Lasix 80 mg daily x two doses then back to 40 mg daily. PharmD will call you with INR and warfarin dosing. Return to [facility] in 6 weeks.

## 2021-11-21 NOTE — Progress Notes (Addendum)
Patient ID: Scott [name], male   DOB: October 19, 1945, 76 y.o.   MRN: [id number]    LVAD CLINIC NOTE  Primary Cardiologist: Dr Haroldine Laws INR : [facility] [place]  HPI: Scott [name] is a 76 y/o male with h/o CAD with ankylosing spondylitis, DM2, carotid stenting, severe ischemic CM s/p anterior STEM with VT arrest in 12/15, large PE with pulmonary infarct in 2/15 s/p IVC filter and systolic HF with EF 24%.  He underwent HM II LVAD placement on 09/20/13. He had a prolonged hospital course due to recurrent chest bleeding, severe CO2 retention and RV failure.  In 8/18 admitted with acute gallstone pancreatitis. Underwent lap chole 02/21/17.   Admitted 2/18 with elevated LDH. He was started on bivalirudin. LDH went down [phone].   Admitted 10/30/21 with VT/VF with shock. Underwent emergent DC-CV. Required inotrope support with milrinone which was successfully weaned. Diuresed with IV lasix. Had AKI with recovery in renal function prior to discharge.   Admitted with recurrent VT. Given IV fluids. Underwent shock x1 complicated by CHB and hypotension. Developed several more episodes of VF while in hospital requiring DC-CV. Remained in CHB. EP consulted. Had CRT-D ICD placed with LBBB lead. D/d on po amio  Here for post-hospital f/u. Feeling much better. No VT or ICD shocks. Having some LE edema. Taking lasix 40 daily. Denies orthopnea or PND. No fevers, chills or problems with driveline. No bleeding, melena or neuro symptoms. No VAD alarms. Taking all meds as prescribed.   VAD Indication: Destination Therapy patient choice     VAD interrogation & Equipment Management: Speed: 9200 Flow:  4.4 Power:  5.1w    PI: 5..8   Alarms: none Events: rare   Fixed speed 9200 Low speed limit: 8600   Primary controller: Replace back-up battery in 16 months Back up controller: Replace back-up battery in 14 months    Annual Equipment Maintenance on UBC/PM was performed on 08/30/2021.     I reviewed the LVAD  parameters from today and compared the results to the patient's prior recorded data. LVAD interrogation was NEGATIVE for significant power changes, NEGATIVE for clinical alarms and STABLE for PI events/speed drops. No programming changes were made and pump is functioning within specified parameters. Pt is performing daily controller and system monitor self tests along with completing weekly and monthly maintenance for LVAD equipment.  SH:  Social History   Socioeconomic History   Marital status: Married    Spouse name: Not on file   Number of children: Not on file   Years of education: Not on file   Highest education level: Not on file  Occupational History   Occupation: retired  Tobacco Use   Smoking status: Former    Packs/day: 0.30    Years: 0.00    Pack years: 0.00    Types: Cigarettes    Quit date: 06/12/1969    Years since quitting: 52.4   Smokeless tobacco: Never   Tobacco comments:    pt smoked while in [employer] 2-3 cig x 6 months.  Vaping Use   Vaping Use: Former  Substance and Sexual Activity   Alcohol use: No    Alcohol/week: 0.0 standard drinks   Drug use: No   Sexual activity: Not Currently  Other Topics Concern   Not on file  Social History Narrative   Not on file   Social Determinants of Health   Financial Resource Strain: Not on file  Food Insecurity: Not on file  Transportation Needs: Not on file  Physical  Activity: Not on file  Stress: Not on file  Social Connections: Not on file  Intimate Partner Violence: Not on file    FH:  Family History  Problem Relation Age of Onset   Hypertension Father    Heart attack Father    Heart Problems Sister     Past Medical History:  Diagnosis Date   Acute on chronic respiratory failure ([place])    a. 08/2014 in setting of PE.   Ankylosing spondylitis ([place])    Bilateral pulmonary embolism ([place])    a. 08/2014 - started on Coumadin. Retrievable IVC filter placed 08/27/14 due to RV strain and large clot burden.    CAD (coronary artery disease)    a. stenting of LCx 2013; b. STEMI 06/12/14 s/p PCI to LAD complicated by post cath shock requiring IABP; VT s/p DCCV, EF 20%; c. NSTEMI 06/26/14 treated medically.   Carotid artery disease ([place])    a. s/p stenting.   Chronic systolic CHF (congestive heart failure) ([place])    Diabetes ([place])    Hemoptysis    a. 08/2014 possibly due to pulm infarct/PE.   Hyperlipidemia    Hypertension    Hypotension    Ischemic cardiomyopathy    a. echo 08/23/2014 EF <20%, dilated CM, mod MR/TR   Leukocytosis    Leukocytosis 09/10/2014   Nephrolithiasis    Pleural effusion on right 08/2014 - small   Reactive thrombocytosis 09/10/2014   Right leg DVT ([place])    a. 08/2014.   Ventricular tachycardia ([place])    a. 06/2014 at time of MI, s/p DCCV.   Current Outpatient Medications  Medication Sig Dispense Refill   acetaminophen (TYLENOL) 500 MG tablet Take 1,000 mg by mouth every 6 (six) hours as needed for moderate pain.      amiodarone (PACERONE) 200 MG tablet Take 2 tablets (400 mg total) by mouth 2 (two) times daily. 120 tablet 1   aspirin EC 81 MG tablet Take 1 tablet (81 mg total) by mouth daily. 90 tablet 3   cephALEXin (KEFLEX) 500 MG capsule Take 1 capsule (500 mg total) by mouth 2 (two) times daily. 90 capsule 6   clobetasol ointment (TEMOVATE) 7.41 % Apply 1 application topically 2 (two) times daily. 30 g 2   clopidogrel (PLAVIX) 75 MG tablet Take 1 tablet (75 mg total) by mouth daily. 90 tablet 3   cyclobenzaprine (FLEXERIL) 5 MG tablet Take 1 tablet (5 mg total) by mouth 3 (three) times daily as needed for muscle spasms. 90 tablet 5   furosemide (LASIX) 20 MG tablet Take 2 tablets (40 mg total) by mouth daily. Take 40 mg daily 120 tablet 3   pantoprazole (PROTONIX) 40 MG tablet Take 1 tablet (40 mg total) by mouth at bedtime. (Patient taking differently: Take 40 mg by mouth daily.) 90 tablet 3   potassium chloride SA (KLOR-CON) 20 MEQ tablet Every other day alternate 20 mEq  (1 tablet) with 40 mEq (2 tablets) (Patient taking differently: 40 mEq See admin instructions. 20 mEq daily WITH furosemide (take 40 mEq if taking 40 mg of furosemide)) 135 tablet 3   rosuvastatin (CRESTOR) 10 MG tablet Take 10 mg by mouth every Monday, Wednesday, and Friday.     traMADol (ULTRAM) 50 MG tablet Take 2 tablets (100 mg total) by mouth every 6 (six) hours as needed for moderate pain. (Patient taking differently: Take 50 mg by mouth daily as needed for moderate pain.) 120 tablet 3   warfarin (JANTOVEN) 6 MG tablet  TAKE 1 tab Tues, Thus, Sat and 1/2 tab all other days 30 tablet 1   No current facility-administered medications for this encounter.    Vital Signs:  Temp: 97.1 Doppler Pressure: 88 Automatc BP:  105/76 (88) HR: 80 SPO2: 97% RA   Weight: 195.8 lbs w/o eqt Last weight: 201 lbs Home weights: 199 - 193 lbs     Weight  Wt Readings from Last 3 Encounters:  11/16/21 91.4 kg (201 lb 8 oz)  11/03/21 84.6 kg (186 lb 8.2 oz)  09/13/21 89.5 kg (197 lb 6.4 oz)   Physical Exam: General:  NAD.  HEENT: normal  Neck: supple. JVP not elevated.  Carotids 2+ bilat; no bruits. No lymphadenopathy or thryomegaly appreciated. Cor: LVAD hum.  Lungs: Clear. Abdomen: obese soft, nontender, non-distended. No hepatosplenomegaly. No bruits or masses. Good bowel sounds. Driveline site clean. Anchor in place.  Extremities: no cyanosis, clubbing, rash. Warm no 1-2+ pedal edema  Neuro: alert & oriented x 3. No focal deficits. Moves all 4 without problem   ASSESSMENT & PLAN:  1. VT/VF c/b CHB - now much improved with CRT-D with LBBB lead 5/23 - no further VT - Continue po amio. Decrease to 200 bid - keep K > 4.0. Mg > 2.0  2. Chronic systolic HF: Ischemic cardiomyopathy, EF 20% s/p HMII LVAD 09/20/13.   - Stable NYHA II  - MAPs ok. Volume status mildly elevated. Double lasix to 80 daily x 2 days. Keep elevated and wrapped.   3. CAD s/p Anterior STEMI on 06/12/14 with stenting of  LAD:  - No s/s ischemia - Off ASA.  - Previously stopped statin but now back on crestor 3x/week. No change  4. DL infection, chronic -Site looks good. -His wife is changing the dressing every 3 days.   5 VAD management:  - Had admit in 08/2017 for elevated LDH.  - LDH stabilized 300-380 range. Concern has been for chronic pump clot.  - Check LDH/INR  - Off asa. On coumadin.  -  Will try to keep INR 2.3- 2.8. Discussed dosing with PharmD personally. Check INR - Continue Keflex as above for DL - VAD interrogated personally. Parameters stable.   6. Ankylosing spondylitis: Off NSAIDS. -  No longer following with rheumatology. Uses tramadol. Avoids NSAIDs as much as possible. -  No change  7. HTN:  - MAPs ok  8. Kidney Stones  - CT with renal calculi bilaterally. Had some recent hematuria  Now followed by Dr Tresa Moore at [facility]. - Has f/u soon     [name], MD  11/21/2021

## 2021-11-21 NOTE — Progress Notes (Signed)
Patient presents for hospital discharge follow up in [facility] today with wife. Reports no problems with VAD equipment or drive line.   Patient arrived ambulatory, no assistance required. He says he is "feeling better than I did".  Patient denies any palpitations, ICD shocks, or syncope since discharge home. Medtronic device interrogated with verbal interpretation from [place] (Medtronic rep) of no VT since discharge home. EKG obtained and reviewed by Dr. Haroldine Laws. Patient has f/u with EP on 11/24/21.   Patient says he still has "fluid on board" with LE swelling. Wife reports he has been taking Lasix 40 mg daily since discharge home. Home weights have been 199 - 193 lbs this am. Discussed with Dr. Haroldine Laws, pt will take Lasix 80 mg daily for two days then return to 40 mg daily.   Pt reports he has been hearing intermittent beeping from one of his batteries during wear. Provided him with new set of battery clips, asked him to dispose of older ones.   Wife reports he accidentally pulled his power module onto floor and cracked it at DL insertion site. Provided patient with new MPU. [name], RN, VAD Coordinator demonstrated MPU to patient and wife with verbalized understanding of same.    Vital Signs:  Temp: 97.1 Doppler Pressure: 88 Automatc BP:  105/76 (88) HR: 80 SPO2: 97% RA   Weight: 195.8 lbs w/o eqt Last weight: 201 lbs Home weights: 199 - 193 lbs  VAD Indication: Destination Therapy patient choice     VAD interrogation & Equipment Management: Speed: 9200 Flow:  4.4 Power:  5.1w    PI: 5..8   Alarms: none Events: rare   Fixed speed 9200 Low speed limit: 8600   Primary controller: Replace back-up battery in 16 months Back up controller: Replace back-up battery in 14 months   Annual Equipment Maintenance on UBC/PM was performed on 08/30/2021.   I reviewed the LVAD parameters from today and compared the results to the patient's prior recorded data. LVAD interrogation  was NEGATIVE for significant power changes, NEGATIVE for clinical alarms and STABLE for PI events/speed drops. No programming changes were made and pump is functioning within specified parameters. Pt is performing daily controller and system monitor self tests along with completing weekly and monthly maintenance for LVAD equipment.   LVAD equipment check completed and is in good working order. Back-up equipment present.    Exit Site Care: Drive line is being maintained daily or as needed by his wife using daily dressing kits. Dressing dry and intact. Stabilization device present and accurately applied. Pt denies fever or chills. 14 anchors and 14 daily dressing kits provided for home use.   Device: Medtronic BiV - Therapy: on 250 bpm -  Pacing:  DDD80 -  Last check:  11/15/21  BP & Labs:  MAP 88 - Doppler is reflecting MAP   Hgb not drawn today  - No S/S of bleeding. Specifically denies melena/BRBPR or nosebleeds.   LDH 353 -  and is within established baseline of 250- 360. Pt denies tea-colored urine. No power elevations noted on interrogation. We will continue to check this with his INR.    Patient Instructions:  Decrease Amiodarone to 200 mg twice daily. Take Lasix 80 mg daily x two doses then back to 40 mg daily. PharmD will call you with INR and warfarin dosing. Return to [facility] in 6 weeks.  Zada Girt RN [place] Coordinator  Office: [phone]  24/7 Pager: [phone]

## 2021-11-21 NOTE — Progress Notes (Signed)
LVAD INR 

## 2021-11-22 ENCOUNTER — Telehealth (HOSPITAL_COMMUNITY): Payer: Self-pay | Admitting: *Deleted

## 2021-11-22 LAB — T4: T4, Total: 10.1 ug/dL (ref 4.5–12.0)

## 2021-11-22 NOTE — Telephone Encounter (Signed)
Called patient per Dr. Haroldine Laws and left message. Based on elevated TSH with normal T4 levels drawn yesterday Dr. Haroldine Laws wants to repeat these labs in 1 month. Patients' amiodarone was decreased at visit yesterday to 200 mg twice daily. Re-scheduled next f/u appt to December 21, 2021 at 9:00. Asked pt/wife to call if they needed to re-schedule.  Zada Girt RN, [place] Coordinator [phone]

## 2021-11-24 ENCOUNTER — Ambulatory Visit (INDEPENDENT_AMBULATORY_CARE_PROVIDER_SITE_OTHER): Payer: Medicare Other | Admitting: Student

## 2021-11-24 ENCOUNTER — Encounter: Payer: Self-pay | Admitting: Student

## 2021-11-24 VITALS — Ht 70.0 in | Wt 190.0 lb

## 2021-11-24 DIAGNOSIS — Z95811 Presence of heart assist device: Secondary | ICD-10-CM

## 2021-11-24 DIAGNOSIS — Z79899 Other long term (current) drug therapy: Secondary | ICD-10-CM

## 2021-11-24 DIAGNOSIS — I472 Ventricular tachycardia, unspecified: Secondary | ICD-10-CM

## 2021-11-24 DIAGNOSIS — I2510 Atherosclerotic heart disease of native coronary artery without angina pectoris: Secondary | ICD-10-CM

## 2021-11-24 LAB — CUP PACEART INCLINIC DEVICE CHECK
Battery Remaining Longevity: 98 mo
Battery Voltage: 3.11 V
Brady Statistic AP VP Percent: 99.73 %
Brady Statistic AP VS Percent: 0.04 %
Brady Statistic AS VP Percent: 0.23 %
Brady Statistic AS VS Percent: 0 %
Brady Statistic RA Percent Paced: 99.68 %
Brady Statistic RV Percent Paced: 99.8 %
Date Time Interrogation Session: 20230526091702
HighPow Impedance: 57 Ohm
Implantable Lead Implant Date: 20230516
Implantable Lead Implant Date: 20230516
Implantable Lead Implant Date: 20230516
Implantable Lead Location: 753859
Implantable Lead Location: 753860
Implantable Lead Location: 753860
Implantable Lead Model: 3830
Implantable Lead Model: 5076
Implantable Pulse Generator Implant Date: 20230516
Lead Channel Impedance Value: 323 Ohm
Lead Channel Impedance Value: 323 Ohm
Lead Channel Impedance Value: 399 Ohm
Lead Channel Impedance Value: 494 Ohm
Lead Channel Impedance Value: 513 Ohm
Lead Channel Impedance Value: 646 Ohm
Lead Channel Pacing Threshold Amplitude: 0.875 V
Lead Channel Pacing Threshold Pulse Width: 0.4 ms
Lead Channel Sensing Intrinsic Amplitude: 1.25 mV
Lead Channel Sensing Intrinsic Amplitude: 2.625 mV
Lead Channel Sensing Intrinsic Amplitude: 7.125 mV
Lead Channel Setting Pacing Amplitude: 0.5 V
Lead Channel Setting Pacing Amplitude: 2 V
Lead Channel Setting Pacing Amplitude: 2.5 V
Lead Channel Setting Pacing Pulse Width: 0.03 ms
Lead Channel Setting Pacing Pulse Width: 0.4 ms
Lead Channel Setting Sensing Sensitivity: 0.3 mV

## 2021-11-24 NOTE — Progress Notes (Addendum)
Electrophysiology Office Note Date: 11/24/2021  ID:  Scott [name], DOB [birth year], MRN [id number]  PCP: [name], MD Primary Cardiologist: None Electrophysiologist: Will Meredith Leeds, MD   CC: Routine ICD follow-up  Scott [name] is a 76 y.o. male seen today for Will Meredith Leeds, MD for post hospital follow up.    Admitted 5/12 - 5/18 with VT/VF. Had similar admission earlier in the month requiring milrinone. Underwent ICD implantation with left bundle area pacing on 11/14/2021.  Weaned to po amiodarone and VT remained quiescent.  Since discharge from hospital the patient reports doing well overall. No ICD shocks. SOB at baseline. Followed very closely by the VAD team. Device has been alarming due to the way his VT/VF is programmed.   Device History: Medtronic  CRT (Left bundle)  ICD implanted 11/14/2021 for VT/VF , chronic systolic CHF History of AAD therapy: Yes; currently on amiodarone    Past Medical History:  Diagnosis Date   Acute on chronic respiratory failure ([place])    a. 08/2014 in setting of PE.   Ankylosing spondylitis ([place])    Bilateral pulmonary embolism ([place])    a. 08/2014 - started on Coumadin. Retrievable IVC filter placed 08/27/14 due to RV strain and large clot burden.   CAD (coronary artery disease)    a. stenting of LCx 2013; b. STEMI 06/12/14 s/p PCI to LAD complicated by post cath shock requiring IABP; VT s/p DCCV, EF 20%; c. NSTEMI 06/26/14 treated medically.   Carotid artery disease ([place])    a. s/p stenting.   Chronic systolic CHF (congestive heart failure) ([place])    Diabetes ([place])    Hemoptysis    a. 08/2014 possibly due to pulm infarct/PE.   Hyperlipidemia    Hypertension    Hypotension    Ischemic cardiomyopathy    a. echo 08/23/2014 EF <20%, dilated CM, mod MR/TR   Leukocytosis    Leukocytosis 09/10/2014   Nephrolithiasis    Pleural effusion on right 08/2014 - small   Reactive thrombocytosis 09/10/2014   Right leg DVT ([place])     a. 08/2014.   Ventricular tachycardia ([place])    a. 06/2014 at time of MI, s/p DCCV.   Past Surgical History:  Procedure Laterality Date   BEDSIDE EVACUATION OF HEMATOMA  10/05/2014   Procedure: EVACUATION OF HEMATOMA;  Surgeon: Ivin Poot, MD;  Location: [facility];  Service: Open Heart Surgery;;   CHOLECYSTECTOMY N/A 02/21/2017   Procedure: LAPAROSCOPIC CHOLECYSTECTOMY;  Surgeon: Rolm Bookbinder, MD;  Location: [facility];  Service: General;  Laterality: N/A;   ICD IMPLANT N/A 11/14/2021   Procedure: ICD IMPLANT;  Surgeon: Constance Haw, MD;  Location: [facility] CV [facility];  Service: Cardiovascular;  Laterality: N/A;   INSERTION OF IMPLANTABLE LEFT VENTRICULAR ASSIST DEVICE N/A 09/21/2014   Procedure: INSERTION OF IMPLANTABLE LEFT VENTRICULAR ASSIST DEVICE;  Surgeon: Ivin Poot, MD;  Location: [facility];  Service: Open Heart Surgery;  Laterality: N/A;  CIRC ARREST  NITRIC OXIDE   LEFT HEART CATHETERIZATION WITH CORONARY ANGIOGRAM N/A 06/12/2014   Procedure: LEFT HEART CATHETERIZATION WITH CORONARY ANGIOGRAM;  Surgeon: Lorretta Harp, MD;  Location: [facility] CATH LAB;  Service: Cardiovascular;  Laterality: N/A;   RIGHT HEART CATHETERIZATION N/A 09/10/2014   Procedure: RIGHT HEART CATH;  Surgeon: [name], MD;  Location: [facility] CATH LAB;  Service: Cardiovascular;  Laterality: N/A;   RIGHT HEART CATHETERIZATION N/A 09/17/2014   Procedure: RIGHT HEART CATH;  Surgeon: Shaune Pascal Bensimhon,  MD;  Location: [facility] CATH LAB;  Service: Cardiovascular;  Laterality: N/A;   TEE WITHOUT CARDIOVERSION N/A 09/21/2014   Procedure: TRANSESOPHAGEAL ECHOCARDIOGRAM (TEE);  Surgeon: Ivin Poot, MD;  Location: [facility];  Service: Open Heart Surgery;  Laterality: N/A;   TEE WITHOUT CARDIOVERSION N/A 10/05/2014   Procedure: TRANSESOPHAGEAL ECHOCARDIOGRAM (TEE);  Surgeon: Ivin Poot, MD;  Location: [facility];  Service: Open Heart Surgery;  Laterality: N/A;   VIDEO ASSISTED THORACOSCOPY (VATS)/THOROCOTOMY Left 10/06/2014    Procedure: VIDEO ASSISTED THORACOSCOPY (VATS)/THOROCOTOMY;  Surgeon: Ivin Poot, MD;  Location: [facility] [facility];  Service: Open Heart Surgery;  Laterality: Left;    Current Outpatient Medications  Medication Sig Dispense Refill   acetaminophen (TYLENOL) 500 MG tablet Take 1,000 mg by mouth every 6 (six) hours as needed for moderate pain.      amiodarone (PACERONE) 200 MG tablet Take 1 tablet (200 mg total) by mouth 2 (two) times daily. 120 tablet 1   aspirin EC 81 MG tablet Take 1 tablet (81 mg total) by mouth daily. 90 tablet 3   cephALEXin (KEFLEX) 500 MG capsule Take 1 capsule (500 mg total) by mouth 2 (two) times daily. 270 capsule 3   clobetasol ointment (TEMOVATE) 6.76 % Apply 1 application topically 2 (two) times daily. 30 g 2   clopidogrel (PLAVIX) 75 MG tablet Take 1 tablet (75 mg total) by mouth daily. 90 tablet 3   cyclobenzaprine (FLEXERIL) 5 MG tablet Take 1 tablet (5 mg total) by mouth 3 (three) times daily as needed for muscle spasms. 90 tablet 5   furosemide (LASIX) 20 MG tablet Take 2 tablets (40 mg total) by mouth daily. Take 40 mg daily 120 tablet 3   pantoprazole (PROTONIX) 40 MG tablet Take 1 tablet (40 mg total) by mouth at bedtime. (Patient taking differently: Take 40 mg by mouth daily.) 90 tablet 3   potassium chloride SA (KLOR-CON) 20 MEQ tablet Every other day alternate 20 mEq (1 tablet) with 40 mEq (2 tablets) (Patient taking differently: 40 mEq See admin instructions. 20 mEq daily WITH furosemide (take 40 mEq if taking 40 mg of furosemide)) 135 tablet 3   rosuvastatin (CRESTOR) 10 MG tablet Take 10 mg by mouth every Monday, Wednesday, and Friday.     traMADol (ULTRAM) 50 MG tablet Take 2 tablets (100 mg total) by mouth every 6 (six) hours as needed for moderate pain. (Patient taking differently: Take 50 mg by mouth daily as needed for moderate pain.) 120 tablet 3   warfarin (JANTOVEN) 6 MG tablet TAKE 1 tab Tues, Thus, Sat and 1/2 tab all other days 30 tablet 1   No current  facility-administered medications for this visit.    Allergies:   Biopatch [chlorhexidine], Desyrel [trazodone], and Other   Social History: Social History   Socioeconomic History   Marital status: Married    Spouse name: Not on file   Number of children: Not on file   Years of education: Not on file   Highest education level: Not on file  Occupational History   Occupation: retired  Tobacco Use   Smoking status: Former    Packs/day: 0.30    Years: 0.00    Pack years: 0.00    Types: Cigarettes    Quit date: 06/12/1969    Years since quitting: 52.4   Smokeless tobacco: Never   Tobacco comments:    pt smoked while in [employer] 2-3 cig x 6 months.  Vaping Use   Vaping Use: Former  Substance and Sexual Activity   Alcohol use: No    Alcohol/week: 0.0 standard drinks   Drug use: No   Sexual activity: Not Currently  Other Topics Concern   Not on file  Social History Narrative   Not on file   Social Determinants of Health   Financial Resource Strain: Not on file  Food Insecurity: Not on file  Transportation Needs: Not on file  Physical Activity: Not on file  Stress: Not on file  Social Connections: Not on file  Intimate Partner Violence: Not on file    Family History: Family History  Problem Relation Age of Onset   Hypertension Father    Heart attack Father    Heart Problems Sister     Review of Systems: All other systems reviewed and are otherwise negative except as noted above.   Physical Exam: Vitals:   11/24/21 0841  Weight: 190 lb (86.2 kg)  Height: '5\' 10"'$  (1.778 m)     GEN- The patient is well appearing, alert and oriented x 3 today.   HEENT: normocephalic, atraumatic; sclera clear, conjunctiva pink; hearing intact; oropharynx clear; neck supple, no JVP Lymph- no cervical lymphadenopathy Lungs- Clear to ausculation bilaterally, normal work of breathing.  No wheezes, rales, rhonchi Heart- Regular rate and rhythm, no murmurs, rubs or gallops, PMI not  laterally displaced GI- soft, non-tender, non-distended, bowel sounds present, no hepatosplenomegaly Extremities- no clubbing or cyanosis. No edema; DP/PT/radial pulses 2+ bilaterally MS- no significant deformity or atrophy Skin- warm and dry, no rash or lesion; ICD pocket well healed Psych- euthymic mood, full affect Neuro- strength and sensation are intact  ICD interrogation- reviewed in detail today,  See PACEART report  EKG:  EKG is no ordered today.  Recent Labs: 11/14/2021: ALT 52 11/16/2021: Hemoglobin 12.5; Platelets 263 11/21/2021: BUN 19; Creatinine, Ser 1.15; Magnesium 2.2; Potassium 4.4; Sodium 140; TSH 9.182   Wt Readings from Last 3 Encounters:  11/24/21 190 lb (86.2 kg)  11/21/21 195 lb 12.8 oz (88.8 kg)  11/16/21 201 lb 8 oz (91.4 kg)     Other studies Reviewed: Additional studies/ records that were reviewed today include: Previous EP office notes.   Assessment and Plan:  1.  Vt/VF s/p Medtronic CRT-D  euvolemic today Stable on an appropriate medical regimen Normal ICD function See Pace Art report No changes today Steri-strips removed today and wound stable. Small amount of swelling around pocket but no obvious hematoma. Pt to keep close eye and alert [place] to any change.   2. Chronic systolic CHF S/p HMII LVAD 09/20/13 Managed by CHF/LVAD team  3. CAD No s/s ischemia  Current medicines are reviewed at length with the patient today.    Disposition:   Follow up with Dr. Curt Bears in 3 months   Signed, Shirley Friar, PA-C  11/24/2021 9:22 AM  [facility] [address] [place] [address] [phone] (office) [phone] (fax)

## 2021-11-25 ENCOUNTER — Other Ambulatory Visit (HOSPITAL_COMMUNITY): Payer: Self-pay | Admitting: Physician Assistant

## 2021-11-25 DIAGNOSIS — Z95811 Presence of heart assist device: Secondary | ICD-10-CM

## 2021-11-30 ENCOUNTER — Other Ambulatory Visit (HOSPITAL_COMMUNITY): Payer: Medicare Other

## 2021-11-30 ENCOUNTER — Other Ambulatory Visit (HOSPITAL_COMMUNITY): Payer: Self-pay | Admitting: Unknown Physician Specialty

## 2021-11-30 DIAGNOSIS — Z7901 Long term (current) use of anticoagulants: Secondary | ICD-10-CM

## 2021-11-30 DIAGNOSIS — Z95811 Presence of heart assist device: Secondary | ICD-10-CM

## 2021-12-01 ENCOUNTER — Encounter (HOSPITAL_COMMUNITY): Payer: Medicare Other

## 2021-12-06 ENCOUNTER — Ambulatory Visit (HOSPITAL_COMMUNITY): Payer: Self-pay | Admitting: Pharmacist

## 2021-12-06 ENCOUNTER — Ambulatory Visit (HOSPITAL_COMMUNITY)
Admission: RE | Admit: 2021-12-06 | Discharge: 2021-12-06 | Disposition: A | Discharge status: Home / Self Care | Payer: Medicare Other | Source: Ambulatory Visit | Attending: Cardiology | Admitting: Cardiology

## 2021-12-06 DIAGNOSIS — Z5181 Encounter for therapeutic drug level monitoring: Secondary | ICD-10-CM | POA: Insufficient documentation

## 2021-12-06 DIAGNOSIS — Z95811 Presence of heart assist device: Secondary | ICD-10-CM | POA: Diagnosis not present

## 2021-12-06 DIAGNOSIS — Z7901 Long term (current) use of anticoagulants: Secondary | ICD-10-CM | POA: Diagnosis not present

## 2021-12-06 LAB — PROTIME-INR
INR: 3.7 — ABNORMAL HIGH (ref 0.8–1.2)
Prothrombin Time: 36.2 seconds — ABNORMAL HIGH (ref 11.4–15.2)

## 2021-12-06 NOTE — Progress Notes (Signed)
Received call from pt's wife this morning reporting that patient has a wound on his right elbow that has been bleeding since Sunday. Pt accidentally dislodged scab on elbow on Sunday when he scratched the area and since has been bleeding. [name] reports that she has changed the bandage several times, and has tried to apply pressure with a tape dressing, and has applied some type of "coagulant sheet" to the site. Advised that if wound is still bleeding pt may need to be seen by PCP or urgent care to assess wound. Pt does not have PCP, and does not want to go to urgent care. Requesting to come to clinic today for INR check.   Pt came to clinic for INR- 3.7 today. Per [name]: INR > goal (per DB goal is 2.3-3.0), had some bleeding from wound on arm, so they took 3 mg on Tuesday instead of 6 mg. Amiodarone recently started. Instructed to  HOLD warfarin tonight then decrease current regimen to 6 mg Tues/Sat and 3 mg all other days.   VAD coordinator assessed wound while pt here for labs. Slight ooze noted around outer boarder of wound, but scab mostly intact over wound bed. Site cleansed with saline wipe, and bandaid x 2 applied. Instructed pt's wife to leave dressing in place unless bleeding through bandaid. If continues to drain heavily pt will need to be seen at urgent care. She verbalized understanding.     Emerson Monte RN [place] Coordinator  Office: [phone]  24/7 Pager: [phone]

## 2021-12-06 NOTE — Addendum Note (Signed)
Encounter addended by: Mertha Baars, RN on: 12/06/2021 1:53 PM  Actions taken: Clinical Note Signed

## 2021-12-06 NOTE — Progress Notes (Signed)
LVAD INR 

## 2021-12-07 ENCOUNTER — Other Ambulatory Visit (HOSPITAL_COMMUNITY): Payer: Medicare Other

## 2021-12-13 ENCOUNTER — Encounter (HOSPITAL_COMMUNITY): Payer: Medicare Other

## 2021-12-14 ENCOUNTER — Other Ambulatory Visit (HOSPITAL_COMMUNITY): Payer: Self-pay | Admitting: *Deleted

## 2021-12-14 ENCOUNTER — Other Ambulatory Visit (HOSPITAL_COMMUNITY): Payer: Self-pay | Admitting: Physician Assistant

## 2021-12-14 ENCOUNTER — Telehealth (HOSPITAL_COMMUNITY): Payer: Self-pay | Admitting: *Deleted

## 2021-12-14 DIAGNOSIS — Z95811 Presence of heart assist device: Secondary | ICD-10-CM

## 2021-12-14 DIAGNOSIS — Z7901 Long term (current) use of anticoagulants: Secondary | ICD-10-CM

## 2021-12-14 DIAGNOSIS — G479 Sleep disorder, unspecified: Secondary | ICD-10-CM

## 2021-12-14 DIAGNOSIS — R0609 Other forms of dyspnea: Secondary | ICD-10-CM

## 2021-12-14 DIAGNOSIS — I5023 Acute on chronic systolic (congestive) heart failure: Secondary | ICD-10-CM

## 2021-12-14 DIAGNOSIS — I2510 Atherosclerotic heart disease of native coronary artery without angina pectoris: Secondary | ICD-10-CM

## 2021-12-14 NOTE — Telephone Encounter (Signed)
Received call from pt's wife reporting pt has had a nosebleed for the last 30 min. They have tried packing his nose, and once they pull packing out he starts re-bleeding. Instructed her to hold firm pressure on the bridge of his nose with an ice pack for 30 - 45 minutes without placing packing in his nose. If he is still bleeding after that he may call VAD coordinator for further instructions. She verbalized understanding.   They would like to come for INR check tomorrow. Scheduled in lab at [facility] 12/15/21.   Emerson Monte RN [place] Coordinator  Office: [phone]  24/7 Pager: [phone]

## 2021-12-15 ENCOUNTER — Ambulatory Visit (HOSPITAL_COMMUNITY): Payer: Self-pay | Admitting: Pharmacist

## 2021-12-15 ENCOUNTER — Ambulatory Visit (HOSPITAL_COMMUNITY)
Admission: RE | Admit: 2021-12-15 | Discharge: 2021-12-15 | Disposition: A | Discharge status: Home / Self Care | Payer: Medicare Other | Source: Ambulatory Visit | Attending: Internal Medicine | Admitting: Internal Medicine

## 2021-12-15 DIAGNOSIS — Z7901 Long term (current) use of anticoagulants: Secondary | ICD-10-CM | POA: Insufficient documentation

## 2021-12-15 DIAGNOSIS — Z95811 Presence of heart assist device: Secondary | ICD-10-CM | POA: Diagnosis not present

## 2021-12-15 LAB — PROTIME-INR
INR: 3.1 — ABNORMAL HIGH (ref 0.8–1.2)
Prothrombin Time: 31.6 seconds — ABNORMAL HIGH (ref 11.4–15.2)

## 2021-12-15 LAB — LACTATE DEHYDROGENASE: LDH: 334 U/L — ABNORMAL HIGH (ref 98–192)

## 2021-12-15 NOTE — Progress Notes (Signed)
LVAD INR 

## 2021-12-16 ENCOUNTER — Emergency Department
Admission: EM | Admit: 2021-12-16 | Discharge: 2021-12-16 | Disposition: A | Discharge status: Home / Self Care | Payer: Medicare Other | Source: Home / Self Care | Attending: Emergency Medicine | Admitting: Emergency Medicine

## 2021-12-16 ENCOUNTER — Other Ambulatory Visit: Payer: Self-pay

## 2021-12-16 ENCOUNTER — Emergency Department
Admission: EM | Admit: 2021-12-16 | Discharge: 2021-12-16 | Disposition: A | Discharge status: Home / Self Care | Payer: Medicare Other | Attending: Emergency Medicine | Admitting: Emergency Medicine

## 2021-12-16 ENCOUNTER — Emergency Department
Admission: EM | Admit: 2021-12-16 | Discharge: 2021-12-17 | Disposition: A | Discharge status: Home / Self Care | Payer: Medicare Other | Source: Home / Self Care | Attending: Emergency Medicine | Admitting: Emergency Medicine

## 2021-12-16 DIAGNOSIS — I110 Hypertensive heart disease with heart failure: Secondary | ICD-10-CM | POA: Insufficient documentation

## 2021-12-16 DIAGNOSIS — E119 Type 2 diabetes mellitus without complications: Secondary | ICD-10-CM | POA: Insufficient documentation

## 2021-12-16 DIAGNOSIS — Z7901 Long term (current) use of anticoagulants: Secondary | ICD-10-CM | POA: Diagnosis not present

## 2021-12-16 DIAGNOSIS — I5022 Chronic systolic (congestive) heart failure: Secondary | ICD-10-CM | POA: Insufficient documentation

## 2021-12-16 DIAGNOSIS — R040 Epistaxis: Secondary | ICD-10-CM | POA: Insufficient documentation

## 2021-12-16 DIAGNOSIS — I2510 Atherosclerotic heart disease of native coronary artery without angina pectoris: Secondary | ICD-10-CM | POA: Insufficient documentation

## 2021-12-16 DIAGNOSIS — Z95811 Presence of heart assist device: Secondary | ICD-10-CM

## 2021-12-16 LAB — CBC WITH DIFFERENTIAL/PLATELET
Abs Immature Granulocytes: 0.06 10*3/uL (ref 0.00–0.07)
Basophils Absolute: 0.1 10*3/uL (ref 0.0–0.1)
Basophils Relative: 1 %
Eosinophils Absolute: 0.2 10*3/uL (ref 0.0–0.5)
Eosinophils Relative: 2 %
HCT: 40.6 % (ref 39.0–52.0)
Hemoglobin: 12.4 g/dL — ABNORMAL LOW (ref 13.0–17.0)
Immature Granulocytes: 1 %
Lymphocytes Relative: 7 %
Lymphs Abs: 0.6 10*3/uL — ABNORMAL LOW (ref 0.7–4.0)
MCH: 29.6 pg (ref 26.0–34.0)
MCHC: 30.5 g/dL (ref 30.0–36.0)
MCV: 96.9 fL (ref 80.0–100.0)
Monocytes Absolute: 1 10*3/uL (ref 0.1–1.0)
Monocytes Relative: 11 %
Neutro Abs: 7.2 10*3/uL (ref 1.7–7.7)
Neutrophils Relative %: 78 %
Platelets: 355 10*3/uL (ref 150–400)
RBC: 4.19 MIL/uL — ABNORMAL LOW (ref 4.22–5.81)
RDW: 18.8 % — ABNORMAL HIGH (ref 11.5–15.5)
WBC: 9.1 10*3/uL (ref 4.0–10.5)
nRBC: 0 % (ref 0.0–0.2)

## 2021-12-16 LAB — COMPREHENSIVE METABOLIC PANEL
ALT: 22 U/L (ref 0–44)
AST: 33 U/L (ref 15–41)
Albumin: 3.7 g/dL (ref 3.5–5.0)
Alkaline Phosphatase: 141 U/L — ABNORMAL HIGH (ref 38–126)
Anion gap: 8 (ref 5–15)
BUN: 23 mg/dL (ref 8–23)
CO2: 26 mmol/L (ref 22–32)
Calcium: 9.5 mg/dL (ref 8.9–10.3)
Chloride: 106 mmol/L (ref 98–111)
Creatinine, Ser: 0.92 mg/dL (ref 0.61–1.24)
GFR, Estimated: >60 mL/min (ref >60)
Glucose, Bld: 157 mg/dL — ABNORMAL HIGH (ref 70–99)
Potassium: 4.2 mmol/L (ref 3.5–5.1)
Sodium: 140 mmol/L (ref 135–145)
Total Bilirubin: 1.9 mg/dL — ABNORMAL HIGH (ref 0.3–1.2)
Total Protein: 7.8 g/dL (ref 6.5–8.1)

## 2021-12-16 LAB — PROTIME-INR
INR: 2.9 — ABNORMAL HIGH (ref 0.8–1.2)
Prothrombin Time: 30.4 seconds — ABNORMAL HIGH (ref 11.4–15.2)

## 2021-12-16 MED ORDER — OXYMETAZOLINE HCL 0.05 % NA SOLN
1.0000 | Freq: Once | NASAL | Status: AC
  Administered 2021-12-16: 1 via NASAL
  Filled 2021-12-16: qty 30

## 2021-12-16 MED ORDER — PHENYLEPHRINE HCL 0.5 % NA SOLN
1.0000 [drp] | Freq: Once | NASAL | Status: AC
  Administered 2021-12-17: 1 [drp] via NASAL
  Filled 2021-12-16: qty 15

## 2021-12-16 MED ORDER — LIDOCAINE-EPINEPHRINE 2 %-1:100000 IJ SOLN
20.0000 mL | Freq: Once | INTRAMUSCULAR | Status: AC
  Administered 2021-12-16: 20 mL
  Filled 2021-12-16: qty 1

## 2021-12-16 MED ORDER — TRANEXAMIC ACID FOR EPISTAXIS
500.0000 mg | Freq: Once | TOPICAL | Status: AC
  Administered 2021-12-16: 500 mg via TOPICAL
  Filled 2021-12-16: qty 10

## 2021-12-16 MED ORDER — TRANEXAMIC ACID FOR EPISTAXIS
500.0000 mg | Freq: Once | TOPICAL | Status: AC
  Administered 2021-12-17: 500 mg via TOPICAL
  Filled 2021-12-16: qty 10

## 2021-12-16 NOTE — ED Notes (Signed)
TXA at bedside for provider.

## 2021-12-16 NOTE — ED Notes (Signed)
ED Provider at bedside. 

## 2021-12-16 NOTE — ED Notes (Signed)
Reviewed d/c instructions with pt and wife, pt sat up and had scant drip from L nares, md notified, md at bedside, no bleeding noted, pt states that he is ready to go home, pt from dpt via wc

## 2021-12-16 NOTE — ED Provider Notes (Signed)
[facility] Provider Note    Event Date/Time   First MD Initiated Contact with Patient 12/16/21 1138     (approximate)   History   Epistaxis   HPI  WILMORE HOLSOMBACK is a 76 y.o. male who presents to the ED for evaluation of Epistaxis   I just saw this patient a few hours ago for the same.  He is anticoagulated on Coumadin due to an LVAD, ICD in place due to V. tach.  He came in earlier this morning due to left-sided anterior epistaxis, resolved with nasal clamping, Afrin and lido with epi.  Did not require packing.  Patient returns to the ED a couple hours after being discharged due to rebleeding.  He reports he sneezed at home, apparently dislodged a clot and began having rebleeding of a similar nature to the left side.  Did not have the clamp or Afrin readily available so he just used a cloth towel over his nose and returns to the ED.  By the time he has brought back and I reevaluate the patient, this bleeding has stopped.  Physical Exam   Triage Vital Signs: ED Triage Vitals  Enc Vitals Group     BP --      Pulse Rate 12/16/21 1113 80     Resp 12/16/21 1113 18     Temp 12/16/21 1113 98.9 F (37.2 C)     Temp Source 12/16/21 1113 Axillary     SpO2 12/16/21 1113 96 %     Weight 12/16/21 1112 184 lb (83.5 kg)     Height 12/16/21 1112 '5\' 10"'$  (1.778 m)     Head Circumference --      Peak Flow --      Pain Score 12/16/21 1112 0     Pain Loc --      Pain Edu? --      Excl. in [place]? --     Most recent vital signs: Vitals:   12/16/21 1113 12/16/21 1255  Pulse: 80 81  Resp: 18 18  Temp: 98.9 F (37.2 C)   SpO2: 96% 97%    General: Awake, no distress.  Dried blood around the left nare.  Clot midway back overlying the middle turbinates without any active bleeding.  No posterior oropharyngeal bleeding. CV:  Good peripheral perfusion.  Resp:  Normal effort.  Abd:  No distention.  MSK:  No deformity noted.  Neuro:  No focal deficits  appreciated. Other:     ED Results / Procedures / Treatments   Labs (all labs ordered are listed, but only abnormal results are displayed) Labs Reviewed - No data to display  EKG   RADIOLOGY   Official radiology report(s): No results found.  PROCEDURES and INTERVENTIONS:  Procedures  Medications  tranexamic acid (CYKLOKAPRON) 1000 MG/10ML topical solution 500 mg (500 mg Topical Given by Other 12/16/21 1200)     IMPRESSION / MDM / ASSESSMENT AND PLAN / ED COURSE  I reviewed the triage vital signs and the nursing notes.  Differential diagnosis includes, but is not limited to, anterior epistaxis, posterior epistaxis, blood loss anemia, nasal septal hematoma, trauma  {Patient presents with symptoms of an acute illness or injury that is potentially life-threatening.  Patient again presents with rebleeding of anterior epistaxis a few hours after I saw him for the same.  By the time I evaluate him, the bleeding has stopped and I do not actually see any actively bleeding during this second visit.  I  applied TXA with an atomizer to the bilateral nares to hopefully reduce any rebleeding.  He is observed for another 2 hours without rebleeding.  Discharged home.  I considered having him try to blow out the clot that I could visualize from his middle turbinate to see if he had anything I could cauterize.  But I am concerned that dislodging a clot in the setting of effective hemostasis without certainty of being able to cauterize anything would be an appropriate.   Clinical Course as of 12/16/21 1522  Sat Dec 16, 2021  1243 Reassessed. No re-bleeding. Again discussed management at home and what to do if he re-bleeds [DS]  1251 He's ready to go and requesting dc [DS]    Clinical Course User Index [DS] Vladimir Crofts, MD     FINAL CLINICAL IMPRESSION(S) / ED DIAGNOSES   Final diagnoses:  Left-sided epistaxis  Epistaxis  Anterior epistaxis  Anticoagulated     Rx / DC Orders    ED Discharge Orders     None        Note:  This document was prepared using Dragon voice recognition software and may include unintentional dictation errors.   Vladimir Crofts, MD 12/16/21 [phone]

## 2021-12-16 NOTE — ED Triage Notes (Signed)
To room 25 with c/o nosebleed. Seen here x 2 today for same. Worried about going to bed with nose clamp on. LVAD pt. On coumadin. States bleeding has stopped intermittently throughout day.

## 2021-12-16 NOTE — ED Notes (Signed)
Pt in bed, meds admin by md, nose clamp placed, cleaned pt's hands and face.

## 2021-12-16 NOTE — ED Triage Notes (Signed)
Patient to ER via POV with intermittent nose bleed x4 days. Patient has LVAD in place, states he had his INR checked yesterday and it was 3.1. Nose started bleeding again this morning. Patient also has new pacemaker/ defibrillator. Denies other symptoms.

## 2021-12-16 NOTE — ED Notes (Signed)
No bleeding noted. Pt denies drainage in the back of his throat.

## 2021-12-16 NOTE — ED Provider Notes (Signed)
[facility] Provider Note    Event Date/Time   First MD Initiated Contact with Patient 12/16/21 0715     (approximate)   History   Epistaxis   HPI  Scott [name] is a 76 y.o. male who presents to the ED for evaluation of Epistaxis   I reviewed outpatient cardiology clinic visit from 5/26.  LVAD patient with a HeartMate 2 who had an ICD placed 1 month ago due to V. tach.  He is anticoagulated on Coumadin with a goal INR of 2.3-2.8.  Patient presents to the ED this morning with his wife for evaluation of epistaxis.  They report intermittent epistaxis at home since Wednesday, 3 days ago.  They have been able to manage with pinching the nose and compression at home, but this morning it was more persistent for a matter of hours and so they presented to the ED for assistance in evaluation.  His INR was checked as an outpatient yesterday with the results of 3.1.  Improving/downtrending from 3.7 10 days ago.  Patient denies any syncopal episodes, dizziness, trauma or injuries, denies any additional trauma.  Denies any shortness of breath, increased swelling or other acute concerns beyond his epistaxis.  No other bleeding such as melena, hematochezia, bleeding gums or hematuria.   Physical Exam   Triage Vital Signs: ED Triage Vitals [12/16/21 0709]  Enc Vitals Group     BP      Pulse Rate 61     Resp 17     Temp 99.5 F (37.5 C)     Temp src      SpO2 95 %     Weight      Height '5\' 10"'$  (1.778 m)     Head Circumference      Peak Flow      Pain Score 0     Pain Loc      Pain Edu?      Excl. in [place]?     Most recent vital signs: Vitals:   12/16/21 0709  Pulse: 61  Resp: 17  Temp: 99.5 F (37.5 C)  SpO2: 95%    General: Awake, no distress.  Sitting up in bed, conversational and well-appearing.  Good color.  Occasional hacking of dark blood into a towel from home. CV:  Good peripheral perfusion.  Resp:  Normal effort.  Abd:  No distention.   MSK:  No deformity noted.  Neuro:  No focal deficits appreciated. Other:  Slow oozing of dark blood from the left nare.  Has a small dark clot to his posterior oropharynx without active posterior bleeding.   ED Results / Procedures / Treatments   Labs (all labs ordered are listed, but only abnormal results are displayed) Labs Reviewed  CBC WITH DIFFERENTIAL/PLATELET - Abnormal; Notable for the following components:      Result Value   RBC 4.19 (*)    Hemoglobin 12.4 (*)    RDW 18.8 (*)    Lymphs Abs 0.6 (*)    All other components within normal limits  PROTIME-INR - Abnormal; Notable for the following components:   Prothrombin Time 30.4 (*)    INR 2.9 (*)    All other components within normal limits  COMPREHENSIVE METABOLIC PANEL    EKG  RADIOLOGY   Official radiology report(s): No results found.  PROCEDURES and INTERVENTIONS:  Procedures  Medications  oxymetazoline (AFRIN) 0.05 % nasal spray 1 spray (1 spray Each Nare Given by Other 12/16/21 0734)  lidocaine-EPINEPHrine (XYLOCAINE W/EPI) 2 %-1:100000 (with pres) injection 20 mL (20 mLs Infiltration Given by Other 12/16/21 0734)     IMPRESSION / MDM / ASSESSMENT AND PLAN / ED COURSE  I reviewed the triage vital signs and the nursing notes.  Differential diagnosis includes, but is not limited to, blood loss anemia, anterior epistaxis, posterior epistaxis, trauma, nasal septal hematoma  {Patient presents with symptoms of an acute illness or injury that is potentially life-threatening.  Anticoagulated patient with an LVAD presents with anterior left-sided epistaxis.  He looks systemically well.  Slow bleed from the left anterior nare.  Provided mixture of lido with epi and Afrin with nasal clamping and resolution of his bleeding.  Clinical Course as of 12/16/21 1140  Sat Dec 16, 2021  0740 Afrin/lido with epi applied [DS]  0177 Judson Roch, [place] coordinator for [facility].  We discussed patient's presentation, improving INR  management.  She is appreciative of care and will let his physician know that he is here. [DS]  0848 Clamp removed.  No bleeding. [DS]  9390 Reassessed.  Looks well.  No bleeding.  We discussed management at home and appropriate return precautions for the ED.  He has a nasal clamp and I gave them the bottle of Afrin to go home with.  We discussed when to use it and how to use it. [DS]    Clinical Course User Index [DS] Vladimir Crofts, MD     FINAL CLINICAL IMPRESSION(S) / ED DIAGNOSES   Final diagnoses:  None     Rx / DC Orders   ED Discharge Orders     None        Note:  This document was prepared using Dragon voice recognition software and may include unintentional dictation errors.   Vladimir Crofts, MD 12/16/21 [phone]

## 2021-12-16 NOTE — ED Triage Notes (Signed)
Pt here for a nose bleed- pt was seen earlier for same- pt has LVAD and new pacemaker/defibrillator

## 2021-12-16 NOTE — ED Notes (Signed)
Clamp removed by md, no bleeding noted, pt denies anything dripping down the back of his throat.

## 2021-12-17 DIAGNOSIS — R040 Epistaxis: Secondary | ICD-10-CM | POA: Diagnosis not present

## 2021-12-17 NOTE — ED Provider Notes (Signed)
[facility] Provider Note    Event Date/Time   First MD Initiated Contact with Patient 12/16/21 2340     (approximate)   History   Epistaxis   HPI  Scott [name] is a 76 y.o. male with an LVAD and on Coumadin who presents for evaluation of epistaxis.  Patient was seen here earlier today twice for the same which resolved with Afrin and topical TXA.  Reports that he went home and this evening it started trickling again.  They tried Afrin and clamping with improvement but patient reports that he was concerned about going to sleep with a clamp on his nose.     Past Medical History:  Diagnosis Date   Acute on chronic respiratory failure ([place])    a. 08/2014 in setting of PE.   Ankylosing spondylitis ([place])    Bilateral pulmonary embolism ([place])    a. 08/2014 - started on Coumadin. Retrievable IVC filter placed 08/27/14 due to RV strain and large clot burden.   CAD (coronary artery disease)    a. stenting of LCx 2013; b. STEMI 06/12/14 s/p PCI to LAD complicated by post cath shock requiring IABP; VT s/p DCCV, EF 20%; c. NSTEMI 06/26/14 treated medically.   Carotid artery disease ([place])    a. s/p stenting.   Chronic systolic CHF (congestive heart failure) ([place])    Diabetes ([place])    Hemoptysis    a. 08/2014 possibly due to pulm infarct/PE.   Hyperlipidemia    Hypertension    Hypotension    Ischemic cardiomyopathy    a. echo 08/23/2014 EF <20%, dilated CM, mod MR/TR   Leukocytosis    Leukocytosis 09/10/2014   Nephrolithiasis    Pleural effusion on right 08/2014 - small   Reactive thrombocytosis 09/10/2014   Right leg DVT ([place])    a. 08/2014.   Ventricular tachycardia ([place])    a. 06/2014 at time of MI, s/p DCCV.    Past Surgical History:  Procedure Laterality Date   BEDSIDE EVACUATION OF HEMATOMA  10/05/2014   Procedure: EVACUATION OF HEMATOMA;  Surgeon: Ivin Poot, MD;  Location: [facility];  Service: Open Heart Surgery;;   CHOLECYSTECTOMY N/A 02/21/2017    Procedure: LAPAROSCOPIC CHOLECYSTECTOMY;  Surgeon: Rolm Bookbinder, MD;  Location: [facility];  Service: General;  Laterality: N/A;   ICD IMPLANT N/A 11/14/2021   Procedure: ICD IMPLANT;  Surgeon: Constance Haw, MD;  Location: [facility] CV LAB;  Service: Cardiovascular;  Laterality: N/A;   INSERTION OF IMPLANTABLE LEFT VENTRICULAR ASSIST DEVICE N/A 09/21/2014   Procedure: INSERTION OF IMPLANTABLE LEFT VENTRICULAR ASSIST DEVICE;  Surgeon: Ivin Poot, MD;  Location: [facility];  Service: Open Heart Surgery;  Laterality: N/A;  CIRC ARREST  NITRIC OXIDE   LEFT HEART CATHETERIZATION WITH CORONARY ANGIOGRAM N/A 06/12/2014   Procedure: LEFT HEART CATHETERIZATION WITH CORONARY ANGIOGRAM;  Surgeon: Lorretta Harp, MD;  Location: [facility] CATH LAB;  Service: Cardiovascular;  Laterality: N/A;   RIGHT HEART CATHETERIZATION N/A 09/10/2014   Procedure: RIGHT HEART CATH;  Surgeon: [name], MD;  Location: [facility] CATH LAB;  Service: Cardiovascular;  Laterality: N/A;   RIGHT HEART CATHETERIZATION N/A 09/17/2014   Procedure: RIGHT HEART CATH;  Surgeon: [name], MD;  Location: [facility] CATH LAB;  Service: Cardiovascular;  Laterality: N/A;   TEE WITHOUT CARDIOVERSION N/A 09/21/2014   Procedure: TRANSESOPHAGEAL ECHOCARDIOGRAM (TEE);  Surgeon: Ivin Poot, MD;  Location: [facility];  Service: Open Heart Surgery;  Laterality: N/A;  TEE WITHOUT CARDIOVERSION N/A 10/05/2014   Procedure: TRANSESOPHAGEAL ECHOCARDIOGRAM (TEE);  Surgeon: Ivin Poot, MD;  Location: [facility] [facility];  Service: Open Heart Surgery;  Laterality: N/A;   VIDEO ASSISTED THORACOSCOPY (VATS)/THOROCOTOMY Left 10/06/2014   Procedure: VIDEO ASSISTED THORACOSCOPY (VATS)/THOROCOTOMY;  Surgeon: Ivin Poot, MD;  Location: [facility];  Service: Open Heart Surgery;  Laterality: Left;     Physical Exam   Triage Vital Signs: ED Triage Vitals  Enc Vitals Group     BP --      Pulse Rate 12/16/21 2311 78     Resp 12/16/21 2311 18     Temp 12/16/21 2311  98.4 F (36.9 C)     Temp Source 12/16/21 2311 Axillary     SpO2 12/16/21 2311 97 %     Weight --      Height --      Head Circumference --      Peak Flow --      Pain Score 12/16/21 2312 0     Pain Loc --      Pain Edu? --      Excl. in [place]? --     Most recent vital signs: Vitals:   12/16/21 2311  Pulse: 78  Resp: 18  Temp: 98.4 F (36.9 C)  SpO2: 97%     Constitutional: Alert and oriented. Well appearing and in no apparent distress. HEENT:      Head: Normocephalic and atraumatic.         Eyes: Conjunctivae are normal. Sclera is non-icteric.       Nose: small abrasion on the L nare with no active bleeding       Mouth/Throat: Mucous membranes are moist.       Neck: Supple with no signs of meningismus. Cardiovascular: Regular rate and rhythm.  Respiratory: Normal respiratory effort.  Musculoskeletal:  No edema, cyanosis, or erythema of extremities. Neurologic: Normal speech and language. Face is symmetric. Moving all extremities. No gross focal neurologic deficits are appreciated. Skin: Skin is warm, dry and intact. No rash noted. Psychiatric: Mood and affect are normal. Speech and behavior are normal.  ED Results / Procedures / Treatments   Labs (all labs ordered are listed, but only abnormal results are displayed) Labs Reviewed - No data to display   EKG  none   RADIOLOGY none   PROCEDURES:  Critical Care performed: No  .Epistaxis Management  Date/Time: 12/17/2021 1:52 AM  Performed by: Rudene Re, MD Authorized by: Rudene Re, MD   Consent:    Consent obtained:  Verbal   Consent given by:  Patient   Risks, benefits, and alternatives were discussed: yes     Risks discussed:  Bleeding, nasal injury and pain   Alternatives discussed:  Delayed treatment and alternative treatment Procedure details:    Treatment method:  Thrombin   Treatment complexity:  Limited   Treatment episode: recurring   Post-procedure details:     Assessment:  Bleeding stopped   Procedure completion:  Tolerated    IMPRESSION / MDM / ASSESSMENT AND PLAN / ED COURSE  I reviewed the triage vital signs and the nursing notes.  76 y.o. male with an LVAD and on Coumadin who presents for evaluation of epistaxis.  Patient has a small abrasion with a clot in place and no active bleeding on the left nare.  Topical Afrin followed by topical TXA with clamp was applied.  Clamp was removed 30 minutes later and patient was monitored for 1.5 hours without  recurrent bleeding.  Patient stable for discharge home.  Referral placed to ENT.  Discuss home care and my standard return precautions   MEDICATIONS GIVEN IN ED: Medications  phenylephrine (NEO-SYNEPHRINE) 0.5 % nasal solution 1 drop (1 drop Each Nare Given by Other 12/17/21 0005)  tranexamic acid (CYKLOKAPRON) 1000 MG/10ML topical solution 500 mg (500 mg Topical Given by Other 12/17/21 0005)    EMR reviewed including records from his last visit with his cardiologist for his LVAD from May 2023    FINAL CLINICAL IMPRESSION(S) / ED DIAGNOSES   Final diagnoses:  Epistaxis     Rx / DC Orders   ED Discharge Orders          Ordered    Ambulatory referral to ENT  Status:  Canceled       Comments: Recurrent epistaxis   12/17/21 0151    Ambulatory referral to ENT       Comments: Recurrent epistaxis   12/17/21 0151             Note:  This document was prepared using Dragon voice recognition software and may include unintentional dictation errors.   Please note:  Patient was evaluated in Emergency Department today for the symptoms described in the history of present illness. Patient was evaluated in the context of the global COVID-19 pandemic, which necessitated consideration that the patient might be at risk for infection with the SARS-CoV-2 virus that causes COVID-19. Institutional protocols and algorithms that pertain to the evaluation of patients at risk for COVID-19 are in a state of  rapid change based on information released by regulatory bodies including the CDC and federal and state organizations. These policies and algorithms were followed during the patient's care in the ED.  Some ED evaluations and interventions may be delayed as a result of limited staffing during the pandemic.       Alfred Levins, [place], MD 12/17/21 [phone]

## 2021-12-18 ENCOUNTER — Telehealth: Payer: Self-pay | Admitting: *Deleted

## 2021-12-18 NOTE — Telephone Encounter (Signed)
Received voicemail message from [name] reporting that pt presented to [facility] ER x 3 on Saturday for nosebleed. (See ER notes for treatment plan). States he had minor nosebleed on yesterday (Sunday) but did not go back to ER. Reports he coughed up 1 clot yesterday evening. No bleeding seen since. Reports she held ASA Friday, Saturday, Sunday, with plans to restart today. INR 2.9 in ER over the weekend. Coumadin dosing per Ander Purpura PharmD. They will call if re-bleeding occurs.  Pt has follow up appt scheduled in VAD clinic 12/21/21 at 0900.  Emerson Monte RN [place] Coordinator  Office: [phone]  24/7 Pager: [phone]

## 2021-12-20 ENCOUNTER — Emergency Department
Admission: EM | Admit: 2021-12-20 | Discharge: 2021-12-20 | Disposition: A | Discharge status: Home / Self Care | Payer: Medicare Other | Attending: Emergency Medicine | Admitting: Emergency Medicine

## 2021-12-20 ENCOUNTER — Other Ambulatory Visit: Payer: Self-pay

## 2021-12-20 ENCOUNTER — Other Ambulatory Visit (HOSPITAL_COMMUNITY): Payer: Self-pay | Admitting: Unknown Physician Specialty

## 2021-12-20 DIAGNOSIS — Z95811 Presence of heart assist device: Secondary | ICD-10-CM

## 2021-12-20 DIAGNOSIS — Z7901 Long term (current) use of anticoagulants: Secondary | ICD-10-CM

## 2021-12-20 DIAGNOSIS — Z7982 Long term (current) use of aspirin: Secondary | ICD-10-CM | POA: Diagnosis not present

## 2021-12-20 DIAGNOSIS — I110 Hypertensive heart disease with heart failure: Secondary | ICD-10-CM | POA: Insufficient documentation

## 2021-12-20 DIAGNOSIS — I5022 Chronic systolic (congestive) heart failure: Secondary | ICD-10-CM | POA: Diagnosis not present

## 2021-12-20 DIAGNOSIS — Z86711 Personal history of pulmonary embolism: Secondary | ICD-10-CM | POA: Diagnosis not present

## 2021-12-20 DIAGNOSIS — E119 Type 2 diabetes mellitus without complications: Secondary | ICD-10-CM | POA: Diagnosis not present

## 2021-12-20 DIAGNOSIS — Z95810 Presence of automatic (implantable) cardiac defibrillator: Secondary | ICD-10-CM | POA: Insufficient documentation

## 2021-12-20 DIAGNOSIS — I2510 Atherosclerotic heart disease of native coronary artery without angina pectoris: Secondary | ICD-10-CM | POA: Insufficient documentation

## 2021-12-20 DIAGNOSIS — R040 Epistaxis: Secondary | ICD-10-CM | POA: Diagnosis not present

## 2021-12-20 DIAGNOSIS — Z955 Presence of coronary angioplasty implant and graft: Secondary | ICD-10-CM | POA: Insufficient documentation

## 2021-12-20 DIAGNOSIS — E064 Drug-induced thyroiditis: Secondary | ICD-10-CM

## 2021-12-20 LAB — BASIC METABOLIC PANEL
Anion gap: 7 (ref 5–15)
BUN: 24 mg/dL — ABNORMAL HIGH (ref 8–23)
CO2: 27 mmol/L (ref 22–32)
Calcium: 9.1 mg/dL (ref 8.9–10.3)
Chloride: 105 mmol/L (ref 98–111)
Creatinine, Ser: 1.06 mg/dL (ref 0.61–1.24)
GFR, Estimated: >60 mL/min (ref >60)
Glucose, Bld: 143 mg/dL — ABNORMAL HIGH (ref 70–99)
Potassium: 4.4 mmol/L (ref 3.5–5.1)
Sodium: 139 mmol/L (ref 135–145)

## 2021-12-20 LAB — CBC
HCT: 38.2 % — ABNORMAL LOW (ref 39.0–52.0)
Hemoglobin: 11.7 g/dL — ABNORMAL LOW (ref 13.0–17.0)
MCH: 29.5 pg (ref 26.0–34.0)
MCHC: 30.6 g/dL (ref 30.0–36.0)
MCV: 96.5 fL (ref 80.0–100.0)
Platelets: 343 10*3/uL (ref 150–400)
RBC: 3.96 MIL/uL — ABNORMAL LOW (ref 4.22–5.81)
RDW: 18.6 % — ABNORMAL HIGH (ref 11.5–15.5)
WBC: 9.2 10*3/uL (ref 4.0–10.5)
nRBC: 0.2 % (ref 0.0–0.2)

## 2021-12-20 LAB — PROTIME-INR
INR: 2.2 — ABNORMAL HIGH (ref 0.8–1.2)
Prothrombin Time: 24.4 seconds — ABNORMAL HIGH (ref 11.4–15.2)

## 2021-12-20 MED ORDER — CLINDAMYCIN HCL 150 MG PO CAPS
300.0000 mg | ORAL_CAPSULE | Freq: Once | ORAL | Status: AC
  Administered 2021-12-20: 300 mg via ORAL
  Filled 2021-12-20: qty 2

## 2021-12-20 MED ORDER — OXYMETAZOLINE HCL 0.05 % NA SOLN
2.0000 | Freq: Once | NASAL | Status: AC
Start: 2021-12-20 — End: 2021-12-20
  Administered 2021-12-20: 2 via NASAL
  Filled 2021-12-20: qty 30

## 2021-12-20 MED ORDER — LIDOCAINE VISCOUS HCL 2 % MT SOLN
15.0000 mL | Freq: Once | OROMUCOSAL | Status: AC
Start: 2021-12-20 — End: 2021-12-20
  Administered 2021-12-20: 15 mL via OROMUCOSAL
  Filled 2021-12-20: qty 15

## 2021-12-20 MED ORDER — CLINDAMYCIN HCL 300 MG PO CAPS: 300.0000 mg | ORAL_CAPSULE | Freq: Three times a day (TID) | ORAL | 0 refills | Status: AC

## 2021-12-20 NOTE — ED Triage Notes (Signed)
Pt presents to ER c/o nosebleed that started tonight at [place].  Pts wife states they have clamped nose and used afrin tonight without relief.  Pt is on warfarin and plavix at home.  Pt also has LVAD that is followed by doctors at [facility].  Pt denies pain at this time.  Pt in NAD at this time.

## 2021-12-20 NOTE — Discharge Instructions (Signed)
Take Clindamycin '300mg'$  3 times daily x5 days.  Make no adjustments to your blood thinners at this time until you see your cardiologist tomorrow.  Keep nasal packing in place.  Return to the ER for recurrent or worsening symptoms, persistent vomiting, difficulty breathing or other concerns.

## 2021-12-20 NOTE — ED Provider Notes (Signed)
[facility] Provider Note    Event Date/Time   First MD Initiated Contact with Patient 12/20/21 [id number]     (approximate)   History   Epistaxis   HPI  Scott [name] is a 76 y.o. male returns to the ED from home with a chief complaint of nosebleed.  Patient has a complex medical history including PE, CAD, ankylosing spondylitis, LVAD on aspirin, Plavix and warfarin.  Last nosebleed 7 years ago.  Recently patient has had issues with left anterior epistaxis.  He was seen in the ED 3 times on 12/16/2021.  A variety of noninvasive measures were used to stop his nosebleed including Afrin, nebulized and topical TXA.  His wife decided to stop his baby aspirin and resumed it last night.  Over the weekend he had a small nosebleed which resolved on its own so he did not come to the ED.  He was awakened tonight around 12:45 AM with mainly left-sided nosebleed with some trickling on his right.  Denies chest pain, shortness of breath, headache, abdominal pain, nausea, vomiting, dizziness or lightheadedness.  Tried Afrin at home with nasal clamp.     Past Medical History   Past Medical History:  Diagnosis Date   Acute on chronic respiratory failure ([place])    a. 08/2014 in setting of PE.   Ankylosing spondylitis ([place])    Bilateral pulmonary embolism ([place])    a. 08/2014 - started on Coumadin. Retrievable IVC filter placed 08/27/14 due to RV strain and large clot burden.   CAD (coronary artery disease)    a. stenting of LCx 2013; b. STEMI 06/12/14 s/p PCI to LAD complicated by post cath shock requiring IABP; VT s/p DCCV, EF 20%; c. NSTEMI 06/26/14 treated medically.   Carotid artery disease ([place])    a. s/p stenting.   Chronic systolic CHF (congestive heart failure) ([place])    Diabetes ([place])    Hemoptysis    a. 08/2014 possibly due to pulm infarct/PE.   Hyperlipidemia    Hypertension    Hypotension    Ischemic cardiomyopathy    a. echo 08/23/2014 EF <20%, dilated CM, mod  MR/TR   Leukocytosis    Leukocytosis 09/10/2014   Nephrolithiasis    Pleural effusion on right 08/2014 - small   Reactive thrombocytosis 09/10/2014   Right leg DVT ([place])    a. 08/2014.   Ventricular tachycardia ([place])    a. 06/2014 at time of MI, s/p DCCV.     Active Problem List   Patient Active Problem List   Diagnosis Date Noted   Hyperkalemia 11/10/2021   Supratherapeutic INR 11/10/2021   V-tach ([place]) 10/30/2021   Ventricular tachycardia ([place]) 10/30/2021   Elevated LDH    Fungal rash of trunk 03/17/2015   Right femoral vein DVT ([place]) 01/10/2015   Chronic anticoagulation 12/02/2014   Anxiety state 10/11/2014   Sleep disturbance 10/11/2014   LVAD (left ventricular assist device) present ([place])    PAF (paroxysmal atrial fibrillation) (H[facility])    CHF (congestive heart failure) ([place]) 09/21/2014   Palliative care encounter 09/16/2014   Weakness generalized 09/16/2014   Encounter for therapeutic drug monitoring 09/08/2014   Cough with hemoptysis    Hypoxia    PE (pulmonary embolism) 08/26/2014   Pulmonary embolus ([place]) 08/26/2014   CAD in native artery 36/64/4034   Chronic systolic heart failure ([place]) 06/22/2014   Abdominal pain, acute    STEMI (ST elevation myocardial infarction) ([place]) 06/12/2014   VT (ventricular tachycardia) ([place])  ST elevation myocardial infarction involving left anterior descending (LAD) coronary artery [facility])      Past Surgical History   Past Surgical History:  Procedure Laterality Date   BEDSIDE EVACUATION OF HEMATOMA  10/05/2014   Procedure: EVACUATION OF HEMATOMA;  Surgeon: Ivin Poot, MD;  Location: [place];  Service: Open Heart Surgery;;   CHOLECYSTECTOMY N/A 02/21/2017   Procedure: LAPAROSCOPIC CHOLECYSTECTOMY;  Surgeon: Rolm Bookbinder, MD;  Location: [place];  Service: General;  Laterality: N/A;   ICD IMPLANT N/A 11/14/2021   Procedure: ICD IMPLANT;  Surgeon: Constance Haw, MD;  Location: [place] CV LAB;  Service: Cardiovascular;   Laterality: N/A;   INSERTION OF IMPLANTABLE LEFT VENTRICULAR ASSIST DEVICE N/A 09/21/2014   Procedure: INSERTION OF IMPLANTABLE LEFT VENTRICULAR ASSIST DEVICE;  Surgeon: Ivin Poot, MD;  Location: [place];  Service: Open Heart Surgery;  Laterality: N/A;  CIRC ARREST  NITRIC OXIDE   LEFT HEART CATHETERIZATION WITH CORONARY ANGIOGRAM N/A 06/12/2014   Procedure: LEFT HEART CATHETERIZATION WITH CORONARY ANGIOGRAM;  Surgeon: Lorretta Harp, MD;  Location: [facility] CATH LAB;  Service: Cardiovascular;  Laterality: N/A;   RIGHT HEART CATHETERIZATION N/A 09/10/2014   Procedure: RIGHT HEART CATH;  Surgeon: [name], MD;  Location: [facility] CATH LAB;  Service: Cardiovascular;  Laterality: N/A;   RIGHT HEART CATHETERIZATION N/A 09/17/2014   Procedure: RIGHT HEART CATH;  Surgeon: [name], MD;  Location: [facility] CATH LAB;  Service: Cardiovascular;  Laterality: N/A;   TEE WITHOUT CARDIOVERSION N/A 09/21/2014   Procedure: TRANSESOPHAGEAL ECHOCARDIOGRAM (TEE);  Surgeon: Ivin Poot, MD;  Location: [place];  Service: Open Heart Surgery;  Laterality: N/A;   TEE WITHOUT CARDIOVERSION N/A 10/05/2014   Procedure: TRANSESOPHAGEAL ECHOCARDIOGRAM (TEE);  Surgeon: Ivin Poot, MD;  Location: [place];  Service: Open Heart Surgery;  Laterality: N/A;   VIDEO ASSISTED THORACOSCOPY (VATS)/THOROCOTOMY Left 10/06/2014   Procedure: VIDEO ASSISTED THORACOSCOPY (VATS)/THOROCOTOMY;  Surgeon: Ivin Poot, MD;  Location: [place];  Service: Open Heart Surgery;  Laterality: Left;     Home Medications   Prior to Admission medications   Medication Sig Start Date End Date Taking? Authorizing Provider  acetaminophen (TYLENOL) 500 MG tablet Take 1,000 mg by mouth every 6 (six) hours as needed for moderate pain.     [provider]  amiodarone (PACERONE) 200 MG tablet TAKE 2 TABLETS BY MOUTH 2 TIMES DAILY. 11/28/21   Bensimhon, Shaune Pascal, MD  aspirin EC 81 MG tablet Take 1 tablet (81 mg total) by mouth daily. 11/12/16    Clegg, Amy D, NP  cephALEXin (KEFLEX) 500 MG capsule Take 1 capsule (500 mg total) by mouth 2 (two) times daily. 11/21/21   Bensimhon, Shaune Pascal, MD  clobetasol ointment (TEMOVATE) 0.53 % Apply 1 application topically 2 (two) times daily. 03/15/21   Bensimhon, Shaune Pascal, MD  clopidogrel (PLAVIX) 75 MG tablet Take 1 tablet (75 mg total) by mouth daily. 11/19/21   Clegg, Amy D, NP  cyclobenzaprine (FLEXERIL) 5 MG tablet Take 1 tablet (5 mg total) by mouth 3 (three) times daily as needed for muscle spasms. 06/30/20   Bensimhon, Shaune Pascal, MD  furosemide (LASIX) 20 MG tablet Take 2 tablets (40 mg total) by mouth daily. Take 40 mg daily 11/17/21   Clegg, Amy D, NP  pantoprazole (PROTONIX) 40 MG tablet Take 1 tablet (40 mg total) by mouth at bedtime. Patient taking differently: Take 40 mg by mouth daily. 06/14/21   Bensimhon, Shaune Pascal, MD  potassium chloride SA (KLOR-CON) 20 MEQ tablet Every other day alternate 20 mEq (1 tablet) with 40 mEq (2 tablets) Patient taking differently: 40 mEq See admin instructions. 20 mEq daily WITH furosemide (take 40 mEq if taking 40 mg of furosemide) 03/15/21   Bensimhon, Shaune Pascal, MD  rosuvastatin (CRESTOR) 10 MG tablet Take 10 mg by mouth every Monday, Wednesday, and Friday.    [provider]  traMADol (ULTRAM) 50 MG tablet Take 2 tablets (100 mg total) by mouth every 6 (six) hours as needed for moderate pain. Patient taking differently: Take 50 mg by mouth daily as needed for moderate pain. 08/30/20   Bensimhon, Shaune Pascal, MD  warfarin (COUMADIN) 6 MG tablet TAKE 1 TABLET BY MOUTH EVERY DAY 12/14/21   Bensimhon, Shaune Pascal, MD     Allergies  Biopatch [chlorhexidine], Desyrel [trazodone], and Other   Family History   Family History  Problem Relation Age of Onset   Hypertension Father    Heart attack Father    Heart Problems Sister      Physical Exam  Triage Vital Signs: ED Triage Vitals [12/20/21 0206]  Enc Vitals Group     BP (!) 103/91     Pulse Rate 79      Resp 16     Temp 98.1 F (36.7 C)     Temp Source Oral     SpO2 97 %     Weight      Height      Head Circumference      Peak Flow      Pain Score 0     Pain Loc      Pain Edu?      Excl. in [place]?     Updated Vital Signs: BP 111/80   Pulse 78   Temp 98.1 F (36.7 C) (Oral)   Resp 19   SpO2 96%    General: Awake, no distress.  CV:  Mechanical heart sounds noted.  Good peripheral perfusion.  Resp:  Normal effort.  CTA B. Abd:  No distention.  Other:  Right naris: Small trickle of blood.  Left naris: Mild anterior bleeding.  No bleeding down posterior oropharynx.   ED Results / Procedures / Treatments  Labs (all labs ordered are listed, but only abnormal results are displayed) Labs Reviewed  CBC - Abnormal; Notable for the following components:      Result Value   RBC 3.96 (*)    Hemoglobin 11.7 (*)    HCT 38.2 (*)    RDW 18.6 (*)    All other components within normal limits  PROTIME-INR - Abnormal; Notable for the following components:   Prothrombin Time 24.4 (*)    INR 2.2 (*)    All other components within normal limits  BASIC METABOLIC PANEL - Abnormal; Notable for the following components:   Glucose, Bld 143 (*)    BUN 24 (*)    All other components within normal limits     EKG  None   RADIOLOGY None   Official radiology report(s): No results found.   PROCEDURES:  Critical Care performed: Yes CRITICAL CARE Performed by: Paulette Blanch   Total critical care time: 45 minutes  Critical care time was exclusive of separately billable procedures and treating other patients.  Critical care was necessary to treat or prevent imminent or life-threatening deterioration.  Critical care was time spent personally by me on the following activities: development of treatment plan with patient and/or surrogate as well as  nursing, discussions with consultants, evaluation of patient's response to treatment, examination of patient, obtaining history from  patient or surrogate, ordering and performing treatments and interventions, ordering and review of laboratory studies, ordering and review of radiographic studies, pulse oximetry and re-evaluation of patient's condition.   Marland KitchenEpistaxis Management  Date/Time: 12/20/2021 2:37 AM  Performed by: Paulette Blanch, MD Authorized by: Paulette Blanch, MD   Consent:    Consent obtained:  Verbal   Consent given by:  Patient   Risks, benefits, and alternatives were discussed: yes     Risks discussed:  Bleeding, infection, nasal injury and pain Universal protocol:    Procedure explained and questions answered to patient or proxy's satisfaction: yes     Relevant documents present and verified: yes     Test results available: yes     Required blood products, implants, devices, and special equipment available: yes     Immediately prior to procedure, a time out was called: yes     Patient identity confirmed:  Verbally with patient and arm band Anesthesia:    Anesthesia method:  Topical application Procedure details:    Treatment site:  L anterior   Treatment method:  Merocel sponge   Treatment complexity:  Limited   Treatment episode: initial   Post-procedure details:    Assessment:  Bleeding stopped   Procedure completion:  Tolerated well, no immediate complications    MEDICATIONS ORDERED IN ED: Medications  oxymetazoline (AFRIN) 0.05 % nasal spray 2 spray (2 sprays Left Nare Given by Other 12/20/21 0244)  lidocaine (XYLOCAINE) 2 % viscous mouth solution 15 mL (15 mLs Mouth/Throat Given by Other 12/20/21 0244)  clindamycin (CLEOCIN) capsule 300 mg (300 mg Oral Given 12/20/21 0336)     IMPRESSION / MDM / ASSESSMENT AND PLAN / ED COURSE  I reviewed the triage vital signs and the nursing notes.                             76 year old male presenting with recurrent left anterior epistaxis.  I have identified this patient to have a potentially life-threatening condition.  Differential diagnosis includes  but is not limited to anterior bleed, posterior bleed, area of irritation, etc.  I have personally reviewed patient's records and note his 3 prior ED visits on 12/16/2021, his telephone conversation with his cardiologist on 12/18/2021.  Patient was recently placed on amiodarone and his cardiologist has been adjusting both amiodarone and Coumadin levels.  He does have an appointment with his cardiologist tomorrow.  The patient is on the cardiac monitor to evaluate for evidence of arrhythmia and/or significant heart rate changes.  We will obtain lab work including PT/INR, establish IV to use as needed.  Placed on cardiac monitor.  Discussed with patient and spouse were agreeable with Merocel packing.  Clinical Course as of 12/20/21 0456  Wed Dec 20, 2021  0235 Merocel packing applied to left naris with good effect. Slight ooze from right naris. 2 sprays Afrin applied. Nasal clamp and ice pack applied. Will reassess in 20 minutes. [JS]  0306 Ice pack and nasal clamp removed. No oozing from right naris. Merocel packing clean without blood soaked through. No bleeding in posterior oropharynx. Updated patient and wife of lab results demonstrating stable H/H 11.7/38.2, INR 2.2. Wife states cardiologist has been making adjustments to amiodarone and coumadin. Has an appointment with cardiology tomorrow. Will continue to observe to ensure no rebleeding. Of note, patient has been  on Keflex '500mg'$  bid for the past 2 years empirically. Will also place on 5 day course of Cindamycin for prevention of sinus infection as I anticipate he has developed tolerance to Keflex having been on it so long. A short of of Clindamycin should not overly thin patient's blood as his INR is 2.2 [JS]  0413 No rebleeding.  Patient resting in no acute distress.  We will continue to monitor. [JS]  7672 No recurrence of bleeding.  Oozing from right naris.  Merocel sponge in the left naris continues to be clean.  No bleeding in posterior  oropharynx.  We will continue clindamycin x5 days and patient will follow-up with ENT to remove packing.  Will follow-up with his cardiologist as scheduled tomorrow so no changes to his medications/anticoagulants at this time..  Strict return precautions given.  Patient and spouse verbalized understanding and agree with plan of care. [JS]    Clinical Course User Index [JS] Paulette Blanch, MD     FINAL CLINICAL IMPRESSION(S) / ED DIAGNOSES   Final diagnoses:  Left-sided epistaxis     Rx / DC Orders   ED Discharge Orders     None        Note:  This document was prepared using Dragon voice recognition software and may include unintentional dictation errors.   Paulette Blanch, MD 12/20/21 [phone]

## 2021-12-20 NOTE — ED Notes (Signed)
Pt discharge information reviewed. Pt understands need for follow up care and when to return if symptoms worsen. All questions answered. Pt is alert and oriented with even and regular respirations. Pt is seen ambulating out of department with string steady gait.   

## 2021-12-21 ENCOUNTER — Ambulatory Visit (HOSPITAL_COMMUNITY): Payer: Self-pay | Admitting: Pharmacist

## 2021-12-21 ENCOUNTER — Other Ambulatory Visit (HOSPITAL_COMMUNITY): Payer: Medicare Other

## 2021-12-21 ENCOUNTER — Ambulatory Visit (HOSPITAL_COMMUNITY)
Admission: RE | Admit: 2021-12-21 | Discharge: 2021-12-21 | Disposition: A | Discharge status: Home / Self Care | Payer: Medicare Other | Source: Ambulatory Visit | Attending: Internal Medicine | Admitting: Internal Medicine

## 2021-12-21 ENCOUNTER — Telehealth (HOSPITAL_COMMUNITY): Payer: Self-pay | Admitting: *Deleted

## 2021-12-21 VITALS — BP 111/54 | HR 85 | Wt 187.4 lb

## 2021-12-21 DIAGNOSIS — E064 Drug-induced thyroiditis: Secondary | ICD-10-CM | POA: Insufficient documentation

## 2021-12-21 DIAGNOSIS — R040 Epistaxis: Secondary | ICD-10-CM | POA: Insufficient documentation

## 2021-12-21 DIAGNOSIS — I2510 Atherosclerotic heart disease of native coronary artery without angina pectoris: Secondary | ICD-10-CM | POA: Diagnosis not present

## 2021-12-21 DIAGNOSIS — I255 Ischemic cardiomyopathy: Secondary | ICD-10-CM | POA: Insufficient documentation

## 2021-12-21 DIAGNOSIS — R5383 Other fatigue: Secondary | ICD-10-CM | POA: Diagnosis not present

## 2021-12-21 DIAGNOSIS — I110 Hypertensive heart disease with heart failure: Secondary | ICD-10-CM | POA: Diagnosis not present

## 2021-12-21 DIAGNOSIS — R7402 Elevation of levels of lactic acid dehydrogenase (LDH): Secondary | ICD-10-CM | POA: Diagnosis not present

## 2021-12-21 DIAGNOSIS — Z955 Presence of coronary angioplasty implant and graft: Secondary | ICD-10-CM | POA: Insufficient documentation

## 2021-12-21 DIAGNOSIS — N200 Calculus of kidney: Secondary | ICD-10-CM | POA: Insufficient documentation

## 2021-12-21 DIAGNOSIS — Z7901 Long term (current) use of anticoagulants: Secondary | ICD-10-CM | POA: Insufficient documentation

## 2021-12-21 DIAGNOSIS — Z79899 Other long term (current) drug therapy: Secondary | ICD-10-CM | POA: Insufficient documentation

## 2021-12-21 DIAGNOSIS — I447 Left bundle-branch block, unspecified: Secondary | ICD-10-CM | POA: Diagnosis not present

## 2021-12-21 DIAGNOSIS — Z95811 Presence of heart assist device: Secondary | ICD-10-CM | POA: Diagnosis not present

## 2021-12-21 DIAGNOSIS — I472 Ventricular tachycardia, unspecified: Secondary | ICD-10-CM | POA: Diagnosis not present

## 2021-12-21 DIAGNOSIS — I252 Old myocardial infarction: Secondary | ICD-10-CM | POA: Diagnosis not present

## 2021-12-21 DIAGNOSIS — I5022 Chronic systolic (congestive) heart failure: Secondary | ICD-10-CM | POA: Insufficient documentation

## 2021-12-21 DIAGNOSIS — M459 Ankylosing spondylitis of unspecified sites in spine: Secondary | ICD-10-CM | POA: Insufficient documentation

## 2021-12-21 DIAGNOSIS — Z8674 Personal history of sudden cardiac arrest: Secondary | ICD-10-CM | POA: Diagnosis not present

## 2021-12-21 LAB — PROTIME-INR
INR: 2.6 — ABNORMAL HIGH (ref 0.8–1.2)
Prothrombin Time: 27.6 seconds — ABNORMAL HIGH (ref 11.4–15.2)

## 2021-12-21 LAB — TSH: TSH: 7.8 u[IU]/mL — ABNORMAL HIGH (ref 0.350–4.500)

## 2021-12-21 LAB — LACTATE DEHYDROGENASE: LDH: 277 U/L — ABNORMAL HIGH (ref 98–192)

## 2021-12-21 LAB — T4, FREE: Free T4: 1.01 ng/dL (ref 0.61–1.12)

## 2021-12-21 MED ORDER — AMIODARONE HCL 200 MG PO TABS: 200.0000 mg | ORAL_TABLET | Freq: Every day | ORAL | 3 refills | Status: DC

## 2021-12-21 NOTE — Progress Notes (Addendum)
Patient presents for 1 month follow up in [facility] today with wife. Reports no problems with VAD equipment or drive line.   Pt seen x 4 at [facility] ER for left nare nosebleed. Most recent visit 12/20/21 left nare was packed with Merocel packing. Given Clindamycin x 5 days until packing removed. Left nare packing has dried blood visible. Pt reports he is coughing up blood at times due to blood running down his throat. Reports black stool since Saturday (likely from persistent nosebleed.) Hgb stable 11.7 on 12/21/21. He has appt with Dr Freda Munro ENT on Monday 12/25/21. Pt has not taken ASA since last Friday. Will continue to hold per Dr Haroldine Laws.   Patient arrived ambulatory, no assistance required. He says he is feeling poorly. He has not slept much over the last week due to frequent ER visits for nosebleeds. He denies lightheadedness, dizziness, syncope, palpitations, ICD shocks, and falls. Reports baseline shortness of breath.  Notes poor appetite stating food/drink tastes "crappy". Discussed with Dr Haroldine Laws. Order received to decrease Amiodarone to 200 mg daily. Will see back in clinic in 1 week. At that time may be able to decrease Amiodarone to 100 mg daily if no arrhythmias noted. Updated prescription for Amio 200 mg daily sent to pt's pharmacy.   TSH- 7.8 (down from 9.182)  T4- 1.01. Discussed with Dr Haroldine Laws. No med changes at this time.  Medtronic device interrogated- no VT/VF noted. Patient has f/u with EP on 02/23/22.   Vital Signs:  Temp:  Doppler Pressure: 86 Automatc BP: 111/54 (74) HR: 85 SPO2: 96% RA   Weight: 187.4 lbs w/ eqt Last weight: 195.8 lbs Home weights: 179 - 180 lbs  VAD Indication: Destination Therapy patient choice     VAD interrogation & Equipment Management: Speed: 9200 Flow:  4.8 Power:  5.4 w    PI: 5.5   Alarms: few LV; 6/19- no external power due to power outage at his home Events: rare   Fixed speed 9200 Low speed limit: 8600   Primary  controller: Replace back-up battery in 14 months Back up controller: Replace back-up battery in 14 months   Annual Equipment Maintenance on UBC/PM was performed on 08/30/2021.   I reviewed the LVAD parameters from today and compared the results to the patient's prior recorded data. LVAD interrogation was NEGATIVE for significant power changes, NEGATIVE for clinical alarms and STABLE for PI events/speed drops. No programming changes were made and pump is functioning within specified parameters. Pt is performing daily controller and system monitor self tests along with completing weekly and monthly maintenance for LVAD equipment.   LVAD equipment check completed and is in good working order. Back-up equipment present.    Exit Site Care: Drive line is being maintained daily or as needed by his wife using daily dressing kits. Dressing dry and intact. Stabilization device present and accurately applied. Pt denies fever or chills. 25 anchors and 21 daily dressing kits provided for home use.   Device: Medtronic BiV - Therapy: on 250 bpm -  Pacing:  DDD80 -  Last check:  11/15/21  BP & Labs:  MAP 86 - Doppler is reflecting MAP   Hgb 11.7 on 12/20/21 in ER - No S/S of bleeding. Specifically denies melena/BRBPR or nosebleeds. Reports black stool since Saturday due to swallowing blood from persistent nosebleed.   LDH 277 and is within established baseline of 250- 360. Pt denies tea-colored urine. No power elevations noted on interrogation. We will continue to check this with  his INR.   Patient Instructions:  Continue holding Aspirin Decrease Amiodarone to 200 mg daily Coumadin dosing per [name] PharmD Return to [place] clinic in 1 week   Emerson Monte RN [place] Coordinator  Office: [phone]  24/7 Pager: [phone]

## 2021-12-21 NOTE — Telephone Encounter (Signed)
Received voicemail from pt's wife stating nose started re-bleeding this afternoon. They went to ENT office and met with Dr Tami Ribas. Bleeding had stopped upon arrival to ENT office. Packing left in place. Per [name] Dr Tami Ribas recommended holding Warfarin per Dr Bensimhon's discretion to lower INR to lessen bleeding.   Discussed with Dr Haroldine Laws and Audry Riles PharmD. INR 2.6 today. With patient's history of pump thrombus and elevated LDH decision was made to hold 1 dose of Coumadin to allow for INR 2.0 or greater.   Spoke with [name] regarding holding tonight's dose of Coumadin. Advised if pt continues to have bleeding or bleeds through packing he needs to return to the ER for treatment. She verbalized understanding to all the above.  Emerson Monte RN [place] Coordinator  Office: [phone]  24/7 Pager: [phone]

## 2021-12-21 NOTE — Patient Instructions (Addendum)
Continue holding Aspirin Decrease Amiodarone to 200 mg daily Coumadin dosing per [name] PharmD Return to [place] clinic in 1 week

## 2021-12-22 ENCOUNTER — Other Ambulatory Visit (HOSPITAL_COMMUNITY): Payer: Self-pay | Admitting: Unknown Physician Specialty

## 2021-12-22 DIAGNOSIS — Z7901 Long term (current) use of anticoagulants: Secondary | ICD-10-CM

## 2021-12-22 DIAGNOSIS — Z95811 Presence of heart assist device: Secondary | ICD-10-CM

## 2021-12-25 ENCOUNTER — Ambulatory Visit (HOSPITAL_COMMUNITY)
Admission: RE | Admit: 2021-12-25 | Discharge: 2021-12-25 | Disposition: A | Discharge status: Home / Self Care | Payer: Medicare Other | Source: Ambulatory Visit | Attending: Cardiology | Admitting: Cardiology

## 2021-12-25 ENCOUNTER — Ambulatory Visit (HOSPITAL_COMMUNITY): Payer: Self-pay | Admitting: Pharmacist

## 2021-12-25 DIAGNOSIS — R040 Epistaxis: Secondary | ICD-10-CM | POA: Diagnosis not present

## 2021-12-25 DIAGNOSIS — Z7901 Long term (current) use of anticoagulants: Secondary | ICD-10-CM | POA: Diagnosis not present

## 2021-12-25 DIAGNOSIS — Z95811 Presence of heart assist device: Secondary | ICD-10-CM | POA: Insufficient documentation

## 2021-12-25 DIAGNOSIS — Z48812 Encounter for surgical aftercare following surgery on the circulatory system: Secondary | ICD-10-CM | POA: Diagnosis not present

## 2021-12-25 LAB — CBC
HCT: 37.7 % — ABNORMAL LOW (ref 39.0–52.0)
Hemoglobin: 11.5 g/dL — ABNORMAL LOW (ref 13.0–17.0)
MCH: 29.3 pg (ref 26.0–34.0)
MCHC: 30.5 g/dL (ref 30.0–36.0)
MCV: 96.2 fL (ref 80.0–100.0)
Platelets: 404 10*3/uL — ABNORMAL HIGH (ref 150–400)
RBC: 3.92 MIL/uL — ABNORMAL LOW (ref 4.22–5.81)
RDW: 18 % — ABNORMAL HIGH (ref 11.5–15.5)
WBC: 10.4 10*3/uL (ref 4.0–10.5)
nRBC: 0 % (ref 0.0–0.2)

## 2021-12-25 LAB — PROTIME-INR
INR: 1.8 — ABNORMAL HIGH (ref 0.8–1.2)
Prothrombin Time: 20.6 seconds — ABNORMAL HIGH (ref 11.4–15.2)

## 2021-12-25 NOTE — Progress Notes (Signed)
LVAD INR 

## 2021-12-28 ENCOUNTER — Other Ambulatory Visit (HOSPITAL_COMMUNITY): Payer: Self-pay | Admitting: *Deleted

## 2021-12-28 DIAGNOSIS — Z95811 Presence of heart assist device: Secondary | ICD-10-CM

## 2021-12-28 DIAGNOSIS — Z79899 Other long term (current) drug therapy: Secondary | ICD-10-CM

## 2021-12-28 DIAGNOSIS — I472 Ventricular tachycardia, unspecified: Secondary | ICD-10-CM

## 2021-12-28 DIAGNOSIS — Z7901 Long term (current) use of anticoagulants: Secondary | ICD-10-CM

## 2021-12-29 ENCOUNTER — Ambulatory Visit (HOSPITAL_COMMUNITY)
Admission: RE | Admit: 2021-12-29 | Discharge: 2021-12-29 | Disposition: A | Discharge status: Home / Self Care | Payer: Medicare Other | Source: Ambulatory Visit | Attending: Internal Medicine | Admitting: Internal Medicine

## 2021-12-29 ENCOUNTER — Ambulatory Visit (HOSPITAL_COMMUNITY): Payer: Self-pay | Admitting: Pharmacist

## 2021-12-29 ENCOUNTER — Encounter (HOSPITAL_COMMUNITY): Payer: Self-pay

## 2021-12-29 ENCOUNTER — Other Ambulatory Visit (HOSPITAL_COMMUNITY): Payer: Self-pay | Admitting: *Deleted

## 2021-12-29 VITALS — BP 91/72 | HR 81 | Wt 187.2 lb

## 2021-12-29 DIAGNOSIS — Z95811 Presence of heart assist device: Secondary | ICD-10-CM | POA: Insufficient documentation

## 2021-12-29 DIAGNOSIS — R1011 Right upper quadrant pain: Secondary | ICD-10-CM

## 2021-12-29 DIAGNOSIS — I509 Heart failure, unspecified: Secondary | ICD-10-CM | POA: Diagnosis not present

## 2021-12-29 DIAGNOSIS — Z79899 Other long term (current) drug therapy: Secondary | ICD-10-CM

## 2021-12-29 DIAGNOSIS — Z7901 Long term (current) use of anticoagulants: Secondary | ICD-10-CM

## 2021-12-29 DIAGNOSIS — Z452 Encounter for adjustment and management of vascular access device: Secondary | ICD-10-CM | POA: Insufficient documentation

## 2021-12-29 DIAGNOSIS — R1310 Dysphagia, unspecified: Secondary | ICD-10-CM

## 2021-12-29 DIAGNOSIS — I472 Ventricular tachycardia, unspecified: Secondary | ICD-10-CM

## 2021-12-29 LAB — COMPREHENSIVE METABOLIC PANEL
ALT: 24 U/L (ref 0–44)
AST: 38 U/L (ref 15–41)
Albumin: 3.6 g/dL (ref 3.5–5.0)
Alkaline Phosphatase: 137 U/L — ABNORMAL HIGH (ref 38–126)
Anion gap: 7 (ref 5–15)
BUN: 15 mg/dL (ref 8–23)
CO2: 28 mmol/L (ref 22–32)
Calcium: 9.3 mg/dL (ref 8.9–10.3)
Chloride: 104 mmol/L (ref 98–111)
Creatinine, Ser: 1.12 mg/dL (ref 0.61–1.24)
GFR, Estimated: >60 mL/min (ref >60)
Glucose, Bld: 139 mg/dL — ABNORMAL HIGH (ref 70–99)
Potassium: 4.5 mmol/L (ref 3.5–5.1)
Sodium: 139 mmol/L (ref 135–145)
Total Bilirubin: 1.3 mg/dL — ABNORMAL HIGH (ref 0.3–1.2)
Total Protein: 7.3 g/dL (ref 6.5–8.1)

## 2021-12-29 LAB — CBC
HCT: 36.6 % — ABNORMAL LOW (ref 39.0–52.0)
Hemoglobin: 11.3 g/dL — ABNORMAL LOW (ref 13.0–17.0)
MCH: 29.3 pg (ref 26.0–34.0)
MCHC: 30.9 g/dL (ref 30.0–36.0)
MCV: 94.8 fL (ref 80.0–100.0)
Platelets: 400 10*3/uL (ref 150–400)
RBC: 3.86 MIL/uL — ABNORMAL LOW (ref 4.22–5.81)
RDW: 17.5 % — ABNORMAL HIGH (ref 11.5–15.5)
WBC: 11.2 10*3/uL — ABNORMAL HIGH (ref 4.0–10.5)
nRBC: 0 % (ref 0.0–0.2)

## 2021-12-29 LAB — LACTATE DEHYDROGENASE: LDH: 272 U/L — ABNORMAL HIGH (ref 98–192)

## 2021-12-29 LAB — AMYLASE: Amylase: 107 U/L — ABNORMAL HIGH (ref 28–100)

## 2021-12-29 LAB — SEDIMENTATION RATE: Sed Rate: 27 mm/hr — ABNORMAL HIGH (ref 0–16)

## 2021-12-29 LAB — MAGNESIUM: Magnesium: 2.4 mg/dL (ref 1.7–2.4)

## 2021-12-29 LAB — C-REACTIVE PROTEIN: CRP: 2.7 mg/dL — ABNORMAL HIGH (ref <1.0)

## 2021-12-29 LAB — PROTIME-INR
INR: 1.8 — ABNORMAL HIGH (ref 0.8–1.2)
Prothrombin Time: 20.4 seconds — ABNORMAL HIGH (ref 11.4–15.2)

## 2021-12-29 LAB — LIPASE, BLOOD: Lipase: 38 U/L (ref 11–51)

## 2021-12-29 NOTE — Patient Instructions (Addendum)
May double Lasix for a few days Coumadin dosing per [name] PharmD Return to clinic in 2 weeks We will schedule you for an outpatient barium swallow.  We will send a referral to [place]

## 2021-12-29 NOTE — Progress Notes (Signed)
LVAD INR 

## 2021-12-29 NOTE — Progress Notes (Addendum)
Patient presents for 1 week follow up in [facility] today with wife. Reports no problems with VAD equipment or drive line.   Patient arrived ambulatory, no assistance required. He says he is still feeling poorly, but maybe slightly better. He denies lightheadedness, dizziness, syncope, palpitations, ICD shocks, and falls. Reports baseline shortness of breath.  Notes poor appetite stating some drinks still has a "chemical" taste, but food is starting to taste better.   Decreased Amiodarone to 200 mg daily as instructed last visit. Per Dr Haroldine Laws will keep at this dose for now. Medtronic device interrogated- no VT/VF noted. Patient has f/u with EP on 02/23/22.   Complaining of right upper quadrant abdominal pain with swallowing anything but water. Area is not tender to palpation. CMET, Amylase, Lipase, Sed rate, CRP added to today's labs. Per Dr Haroldine Laws refer to GI and schedule for barium swallow.   Drive line dressing changed today; see documentation below.   Seen at ENT office on Monday. Nasal packing removed. No further bleeding seen at this time.Will continue to hold ASA, and continue new INR goal 2-2.5 per Dr Haroldine Laws.   Vital Signs:  Temp:  Doppler Pressure: 86 Automatc BP: 91/72 (80) HR: 81 SPO2: 97% RA   Weight: 187.2 lbs w/ eqt Last weight: 187.4 lbs Home weights: 177 - 180 lbs  VAD Indication: Destination Therapy patient choice     VAD interrogation & Equipment Management: Speed: 9200 Flow: 5.0 Power:  5.5 w    PI: 4.8   Alarms: few LV; 6/19- no external power due to power outage at his home Events: 5-10  Fixed speed 9200 Low speed limit: 8600   Primary controller: Replace back-up battery in 14 months Back up controller: Replace back-up battery in 14 months   Annual Equipment Maintenance on UBC/PM was performed on 08/30/2021.   I reviewed the LVAD parameters from today and compared the results to the patient's prior recorded data. LVAD interrogation was NEGATIVE  for significant power changes, NEGATIVE for clinical alarms and STABLE for PI events/speed drops. No programming changes were made and pump is functioning within specified parameters. Pt is performing daily controller and system monitor self tests along with completing weekly and monthly maintenance for LVAD equipment.   LVAD equipment check completed and is in good working order. Back-up equipment present.    Exit Site Care: Drive line is being maintained daily or as needed by his wife using daily dressing kits. Dressing dry and intact. Stabilization device present and accurately applied. Existing VAD dressing removed and site care performed using sterile technique. Drive line exit site cleaned with sterile saline wipe x 2, allowed to dry, and gauze dressing with NO SILVER STRIP applied. Exit site healed and  partially incorporated, the velour is fully implanted at exit site. Scant amount of sticky bloody drainage noted at site. This is not new for him. On chronic Keflex. Denies trauma. No redness, tenderness, foul odor or rash noted. Drive line anchor re-applied. Pt denies fever or chills. Pt has adequate supplies for home use.      Device: Medtronic BiV - Therapy: on 250 bpm -  Pacing:  DDD80 -  Last check:  11/15/21  BP & Labs:  MAP 86 - Doppler is reflecting MAP   Hgb 11.3 - No S/S of bleeding. Specifically denies melena/BRBPR or nosebleeds.    LDH  272 and is within established baseline of 250- 360. Pt denies tea-colored urine. No power elevations noted on interrogation. We will continue to  check this with his INR.   Patient Instructions:  May double Lasix for a few days Coumadin dosing per [name] PharmD Return to clinic in 2 weeks We will schedule you for an outpatient barium swallow.  We will send a referral to [facility]   Addendum: Barium swallow schedule 01/03/22 at 9:30 at [facility] Radiology department. Must be NPO past MN. Spoke with pt's wife [name] and made aware of appt  information.   [facility] appt scheduled 01/26/22 at 0930 with Georgina Peer PA. Left voicemail message for pt and [name] with appt information, office address/phone number. Requested call back with any questions.   Emerson Monte RN [place] Coordinator  Office: [phone]  24/7 Pager: [phone]

## 2022-01-02 ENCOUNTER — Encounter (HOSPITAL_COMMUNITY): Payer: Medicare Other

## 2022-01-03 ENCOUNTER — Telehealth (HOSPITAL_COMMUNITY): Payer: Self-pay | Admitting: Unknown Physician Specialty

## 2022-01-03 ENCOUNTER — Ambulatory Visit (HOSPITAL_COMMUNITY)
Admission: RE | Admit: 2022-01-03 | Discharge: 2022-01-03 | Disposition: A | Discharge status: Home / Self Care | Payer: Medicare Other | Source: Ambulatory Visit | Attending: Internal Medicine | Admitting: Internal Medicine

## 2022-01-03 DIAGNOSIS — Z95811 Presence of heart assist device: Secondary | ICD-10-CM | POA: Insufficient documentation

## 2022-01-03 DIAGNOSIS — R1011 Right upper quadrant pain: Secondary | ICD-10-CM | POA: Insufficient documentation

## 2022-01-03 DIAGNOSIS — K219 Gastro-esophageal reflux disease without esophagitis: Secondary | ICD-10-CM | POA: Diagnosis not present

## 2022-01-03 DIAGNOSIS — K224 Dyskinesia of esophagus: Secondary | ICD-10-CM | POA: Diagnosis not present

## 2022-01-03 DIAGNOSIS — R1310 Dysphagia, unspecified: Secondary | ICD-10-CM | POA: Diagnosis not present

## 2022-01-03 NOTE — Telephone Encounter (Signed)
Called Scott [name] to let him know about the results of his barium swallow test. Read results to Scott [name] and wife. Scott [name] has appt w/GI on the 28th. Scott [name] is eating modified diet of soft foods. Wife states that Scott [name] is on the cancellation list in hopes of an earlier appt.  [name] RN, BSN VAD Coordinator 24/7 Pager [phone]

## 2022-01-10 ENCOUNTER — Other Ambulatory Visit (HOSPITAL_COMMUNITY): Payer: Self-pay | Admitting: *Deleted

## 2022-01-10 DIAGNOSIS — Z95811 Presence of heart assist device: Secondary | ICD-10-CM

## 2022-01-10 DIAGNOSIS — Z7901 Long term (current) use of anticoagulants: Secondary | ICD-10-CM

## 2022-01-10 DIAGNOSIS — I5022 Chronic systolic (congestive) heart failure: Secondary | ICD-10-CM

## 2022-01-12 ENCOUNTER — Ambulatory Visit (HOSPITAL_COMMUNITY): Payer: Self-pay | Admitting: Pharmacist

## 2022-01-12 ENCOUNTER — Encounter (HOSPITAL_COMMUNITY): Payer: Self-pay

## 2022-01-12 ENCOUNTER — Ambulatory Visit (HOSPITAL_COMMUNITY)
Admission: RE | Admit: 2022-01-12 | Discharge: 2022-01-12 | Disposition: A | Discharge status: Home / Self Care | Payer: Medicare Other | Source: Ambulatory Visit | Attending: Cardiology | Admitting: Cardiology

## 2022-01-12 VITALS — BP 91/62 | HR 81 | Wt 188.2 lb

## 2022-01-12 DIAGNOSIS — Z79899 Other long term (current) drug therapy: Secondary | ICD-10-CM | POA: Diagnosis not present

## 2022-01-12 DIAGNOSIS — I472 Ventricular tachycardia, unspecified: Secondary | ICD-10-CM

## 2022-01-12 DIAGNOSIS — Z7901 Long term (current) use of anticoagulants: Secondary | ICD-10-CM | POA: Diagnosis not present

## 2022-01-12 DIAGNOSIS — R1310 Dysphagia, unspecified: Secondary | ICD-10-CM

## 2022-01-12 DIAGNOSIS — Z95811 Presence of heart assist device: Secondary | ICD-10-CM | POA: Insufficient documentation

## 2022-01-12 DIAGNOSIS — I5022 Chronic systolic (congestive) heart failure: Secondary | ICD-10-CM | POA: Insufficient documentation

## 2022-01-12 LAB — CBC
HCT: 36.9 % — ABNORMAL LOW (ref 39.0–52.0)
Hemoglobin: 11.1 g/dL — ABNORMAL LOW (ref 13.0–17.0)
MCH: 27.6 pg (ref 26.0–34.0)
MCHC: 30.1 g/dL (ref 30.0–36.0)
MCV: 91.8 fL (ref 80.0–100.0)
Platelets: 345 10*3/uL (ref 150–400)
RBC: 4.02 MIL/uL — ABNORMAL LOW (ref 4.22–5.81)
RDW: 17.3 % — ABNORMAL HIGH (ref 11.5–15.5)
WBC: 8.1 10*3/uL (ref 4.0–10.5)
nRBC: 0 % (ref 0.0–0.2)

## 2022-01-12 LAB — PROTIME-INR
INR: 1.9 — ABNORMAL HIGH (ref 0.8–1.2)
Prothrombin Time: 21.4 seconds — ABNORMAL HIGH (ref 11.4–15.2)

## 2022-01-12 LAB — LACTATE DEHYDROGENASE: LDH: 283 U/L — ABNORMAL HIGH (ref 98–192)

## 2022-01-12 LAB — BASIC METABOLIC PANEL
Anion gap: 7 (ref 5–15)
BUN: 16 mg/dL (ref 8–23)
CO2: 27 mmol/L (ref 22–32)
Calcium: 8.8 mg/dL — ABNORMAL LOW (ref 8.9–10.3)
Chloride: 106 mmol/L (ref 98–111)
Creatinine, Ser: 1.13 mg/dL (ref 0.61–1.24)
GFR, Estimated: >60 mL/min (ref >60)
Glucose, Bld: 108 mg/dL — ABNORMAL HIGH (ref 70–99)
Potassium: 4.2 mmol/L (ref 3.5–5.1)
Sodium: 140 mmol/L (ref 135–145)

## 2022-01-12 NOTE — Progress Notes (Addendum)
Patient presents for 2 week follow up in [facility] today with wife. Reports no problems with VAD equipment or drive line.   Patient arrived ambulatory, no assistance required. He says he is feeling better. He denies lightheadedness, dizziness, syncope, palpitations, ICD shocks, falls, and signs of bleeding. Reports baseline shortness of breath.  Notes appetite is improving, and that metallic taste is less.   Right upper quadrant abdominal pain has improved. Reports occasionally he gets a "twinge" when he swallows, but pain is nothing like it was. Had barium swallow 01/03/22.   1. Smoothly narrowed appearance of the distal esophagus/GE junction. Additionally, a swallowed 13 mm barium tablet was delayed at the level of the distal esophagus (for approximately 2 minutes). These findings may reflect the presence of a mild distal esophageal stricture, and endoscopy should be considered for further evaluation. 2. Mild-to-moderate intermittent esophageal dysmotility with tertiary contractions. 3. Moderate-volume gastroesophageal reflux observed to the level of the upper esophagus.  Has GI appt 01/26/22. Will determine need for any further procedure (possible dilation) to address esophageal stricture at this appt.   Taking Amiodarone 200 mg daily. Per Dr Haroldine Laws will keep at this dose for now. Medtronic device interrogated- no VT/VF noted. Patient has f/u with EP on 02/23/22.   Per [name] pt's drive line has greatly improved and she is changing dressing twice a week.   Vital Signs:  Temp:  Doppler Pressure: 100 Automatc BP: 91/62 (76) HR: 81 SPO2: 97% RA   Weight: 188.2 lbs w/ eqt Last weight: 187.2 lbs Home weights: 177 - 180 lbs  VAD Indication: Destination Therapy patient choice     VAD interrogation & Equipment Management: Speed: 9200 Flow: 4.6 Power:  5.2 w    PI: 5.1   Alarms: few LV Events: 5-10  Fixed speed 9200 Low speed limit: 8600   Primary controller: Replace back-up  battery in 13 months Back up controller: Replace back-up battery in 14 months   Annual Equipment Maintenance on UBC/PM was performed on 08/30/2021.   I reviewed the LVAD parameters from today and compared the results to the patient's prior recorded data. LVAD interrogation was NEGATIVE for significant power changes, NEGATIVE for clinical alarms and STABLE for PI events/speed drops. No programming changes were made and pump is functioning within specified parameters. Pt is performing daily controller and system monitor self tests along with completing weekly and monthly maintenance for LVAD equipment.   LVAD equipment check completed and is in good working order. Back-up equipment present.    Exit Site Care: Drive line is being maintained twice weekly or as needed by his wife using daily dressing kits. Dressing dry and intact. Stabilization device present and accurately applied. Pt denies fever or chills. Pt has adequate supplies for home use.   Device: Medtronic BiV - Therapy: on 250 bpm -  Pacing:  DDD80 -  Last check:  11/15/21  BP & Labs:  MAP 100 - Doppler is reflecting modified systolic   Hgb 98.9 - No S/S of bleeding. Specifically denies melena/BRBPR or nosebleeds.    LDH 283 and is within established baseline of 250- 360. Pt denies tea-colored urine. No power elevations noted on interrogation. We will continue to check this with his INR.   Patient Instructions:  No medication changes  Coumadin dosing per [name] PharmD- Continue 3 mg daily Return to clinic in 1 month for follow up with Dr Haroldine Laws Return to clinic in 2 weeks for INR/LDH   Emerson Monte RN [place] Coordinator  Office: [phone]  24/7 Pager: [phone]

## 2022-01-12 NOTE — Patient Instructions (Addendum)
No medication changes  Coumadin dosing per Ander Purpura PharmD- Continue 3 mg daily Return to clinic in 1 month for follow up with Dr Haroldine Laws Return to clinic in 2 weeks for INR/LDH

## 2022-01-19 ENCOUNTER — Other Ambulatory Visit (HOSPITAL_COMMUNITY): Payer: Self-pay | Admitting: *Deleted

## 2022-01-19 DIAGNOSIS — Z7901 Long term (current) use of anticoagulants: Secondary | ICD-10-CM

## 2022-01-19 DIAGNOSIS — Z95811 Presence of heart assist device: Secondary | ICD-10-CM

## 2022-01-21 NOTE — Progress Notes (Signed)
Patient ID: Scott [name], male   DOB: 03-Feb-1946, 76 y.o.   MRN: [id number]    LVAD CLINIC NOTE  Primary Cardiologist: Dr Haroldine Laws INR : [facility]  [name] is a 76 y/o male with h/o CAD with ankylosing spondylitis, DM2, carotid stenting, severe ischemic CM s/p anterior STEM with VT arrest in 12/15, large PE with pulmonary infarct in 2/15 s/p IVC filter and systolic HF with EF 48%.  He underwent HM II LVAD placement on 09/20/13. He had a prolonged hospital course due to recurrent chest bleeding, severe CO2 retention and RV failure.  In 8/18 admitted with acute gallstone pancreatitis. Underwent lap chole 02/21/17.   Admitted 2/18 with elevated LDH. He was started on bivalirudin. LDH went down [phone].   Admitted 10/30/21 with VT/VF with shock. Underwent emergent DC-CV. Required inotrope support with milrinone which was successfully weaned. Diuresed with IV lasix. Had AKI with recovery in renal function prior to discharge.   Readmitted 5/23 with recurrent VT. Given IV fluids. Underwent shock x1 complicated by CHB and hypotension. Developed several more episodes of VF while in hospital requiring DC-CV. Remained in CHB. EP consulted. Had CRT-D ICD placed with LBBB lead. D/d on po amio  Here for f/u with [name]. Recently seen for sick visit and was having problems with food getting stuck in his esophagus. Barium swallow showed distal stricture. Now cutting up his food and doing better. Has GI appt soon. Otherwise getting stronger. Feels much better,. No ICD shocks. Denies orthopnea or PND. No fevers, chills or problems with driveline. No bleeding, melena or neuro symptoms. No VAD alarms. Taking all meds as prescribed.        VAD Indication: Destination Therapy patient choice     VAD interrogation & Equipment Management: Speed: 9200 Flow: 4.6 Power:  5.2 w    PI: 5.1   Alarms: few LV Events: 5-10  Fixed speed 9200 Low speed limit: 8600   Primary controller: Replace  back-up battery in 13 months Back up controller: Replace back-up battery in 14 months    Annual Equipment Maintenance on UBC/PM was performed on 08/30/2021.   I reviewed the LVAD parameters from today and compared the results to the patient's prior recorded data. LVAD interrogation was NEGATIVE for significant power changes, NEGATIVE for clinical alarms and STABLE for PI events/speed drops. No programming changes were made and pump is functioning within specified parameters. Pt is performing daily controller and system monitor self tests along with completing weekly and monthly maintenance for LVAD equipment.   LVAD equipment check completed and is in good working order. Back-up equipment present.      Exit Site Care: Drive line is being maintained daily or as needed by his wife using daily dressing kits. Dressing dry and intact. Stabilization device present and accurately applied. Pt denies fever or chills. 25 anchors and 21 daily dressing kits provided for home use.   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  Vaping Use   Vaping Use: Former  Substance and Sexual Activity   Alcohol use: No    Alcohol/week: 0.0 standard drinks of alcohol   Drug use: No   Sexual activity: Not Currently  Other Topics Concern   Not on file  Social History Narrative   Not on file   Social Determinants of Health   Financial Resource Strain: Not on file  Food Insecurity: Not on file  Transportation Needs: Not on file   Physical Activity: Not on file  Stress: Not on file  Social Connections: Not on file  Intimate Partner Violence: Not on file    FH:  Family History  Problem Relation Age of Onset   Hypertension Father    Heart attack Father    Heart Problems Sister     Past Medical History:  Diagnosis Date   Acute on chronic respiratory failure ([place])    a. 08/2014 in setting of PE.   Ankylosing spondylitis ([place])    Bilateral pulmonary embolism ([place])    a. 08/2014 - started on Coumadin. Retrievable IVC filter placed 08/27/14 due to RV strain and large clot burden.   CAD (coronary artery disease)    a. stenting of LCx 2013; b. STEMI 06/12/14 s/p PCI to LAD complicated by post cath shock requiring IABP; VT s/p DCCV, EF 20%; c. NSTEMI 06/26/14 treated medically.   Carotid artery disease ([place])    a. s/p stenting.   Chronic systolic CHF (congestive heart failure) ([place])    Diabetes ([place])    Hemoptysis    a. 08/2014 possibly due to pulm infarct/PE.   Hyperlipidemia    Hypertension    Hypotension    Ischemic cardiomyopathy    a. echo 08/23/2014 EF <20%, dilated CM, mod MR/TR   Leukocytosis    Leukocytosis 09/10/2014   Nephrolithiasis    Pleural effusion on right 08/2014 - small   Reactive thrombocytosis 09/10/2014   Right leg DVT ([place])    a. 08/2014.   Ventricular tachycardia ([place])    a. 06/2014 at time of MI, s/p DCCV.   Current Outpatient Medications  Medication Sig Dispense Refill   acetaminophen (TYLENOL) 500 MG tablet Take 1,000 mg by mouth every 6 (six) hours as needed for moderate pain.      amiodarone (PACERONE) 200 MG tablet Take 1 tablet (200 mg total) by mouth daily. 120 tablet 3   cephALEXin (KEFLEX) 500 MG capsule Take 1 capsule (500 mg total) by mouth 2 (two) times daily. 270 capsule 3   clobetasol ointment (TEMOVATE) 9.93 % Apply 1 application topically 2 (two) times daily. 30 g 2   clopidogrel (PLAVIX) 75 MG tablet Take 1 tablet (75 mg total) by mouth daily. 90 tablet 3    cyclobenzaprine (FLEXERIL) 5 MG tablet Take 1 tablet (5 mg total) by mouth 3 (three) times daily as needed for muscle spasms. 90 tablet 5   furosemide (LASIX) 20 MG tablet Take 2 tablets (40 mg total) by mouth daily. Take 40 mg daily (Patient taking differently: Take 40 mg by mouth daily. Take 40 mg daily) 120 tablet 3   pantoprazole (PROTONIX) 40 MG tablet Take 1 tablet (40 mg total) by mouth at bedtime. (Patient taking differently: Take 40 mg by mouth daily.) 90 tablet 3   potassium chloride SA (KLOR-CON) 20 MEQ tablet Every other day alternate 20 mEq (1 tablet) with 40 mEq (2 tablets) (Patient taking  differently: 40 mEq See admin instructions. 20 mEq daily WITH furosemide (take 40 mEq if taking 40 mg of furosemide)) 135 tablet 3   rosuvastatin (CRESTOR) 10 MG tablet Take 10 mg by mouth every Monday, Wednesday, and Friday.     traMADol (ULTRAM) 50 MG tablet Take 2 tablets (100 mg total) by mouth every 6 (six) hours as needed for moderate pain. (Patient taking differently: Take 50 mg by mouth daily as needed for moderate pain.) 120 tablet 3   warfarin (COUMADIN) 6 MG tablet TAKE 1 TABLET BY MOUTH EVERY DAY (Patient taking differently: Take 6 mg by mouth daily at 4 PM. TAKE 1/2 TABLET BY MOUTH EVERY DAY OR AS DIRECTED BY HEART FAILURE CLINIC) 30 tablet 6   aspirin EC 81 MG tablet Take 1 tablet (81 mg total) by mouth daily. (Patient not taking: Reported on 12/29/2021) 90 tablet 3   No current facility-administered medications for this encounter.     Vital Signs:  Temp:  Doppler Pressure: 100 Automatc BP: 91/62 (76) HR: 81 SPO2: 97% RA   Weight: 188.2 lbs w/ eqt Last weight: 187.2 lbs Home weights: 177 - 180 lbs     Weight  Wt Readings from Last 3 Encounters:  01/12/22 85.4 kg (188 lb 3.2 oz)  12/29/21 84.9 kg (187 lb 3.2 oz)  12/21/21 85 kg (187 lb 6.4 oz)   Physical Exam: General:  NAD.  HEENT: normal  Neck: supple. JVP not elevated.  Carotids 2+ bilat; no bruits. No lymphadenopathy  or thryomegaly appreciated. Cor: LVAD hum.  Lungs: Clear. Abdomen: soft, nontender, non-distended. No hepatosplenomegaly. No bruits or masses. Good bowel sounds. Driveline site clean. Anchor in place.  Extremities: no cyanosis, clubbing, rash. Warm no edema  Neuro: alert & oriented x 3. No focal deficits. Moves all 4 without problem    ASSESSMENT & PLAN:  1. Epistaxis - Resolved.  Run INR 2.0-2.5  2. Esophageal stricture - symptomatically improved with cutting food up - has GI eval pending  3. VT/VF c/b CHB - much improved with CRT-D with LBBB lead 5/23 - no further VT - Continue amio at 200 daily - keep K > 4.0. Mg > 2.0  4. Chronic systolic HF: Ischemic cardiomyopathy, EF 20% s/p HMII LVAD 09/20/13.   - Improved. Back to NYHA II - MAPs ok. Volume status ok  5. CAD s/p Anterior STEMI on 06/12/14 with stenting of LAD:  - No s/s ischemia - Off ASA.  - Previously stopped statin but now back on crestor 3x/week. No change  6. DL infection, chronic -Site looks good -His wife is changing the dressing every 3 days.   7. VAD management:  - Had admit in 08/2017 for elevated LDH.  - LDH stabilized 300-380 range.  - LDH 277 - Off asa with recent epistaxis. . On coumadin.  -  Will try to keep INR 2.0-2.5 with HM-2 Discussed dosing with PharmD personally. - Continue Keflex as above for DL - VAD interrogated personally. Parameters stable.  8. Ankylosing spondylitis: Off NSAIDS. -  No longer following with rheumatology. Uses tramadol. Avoids NSAIDs as much as possible. -  No change  9. HTN:  - MAPs ok  10.  Kidney Stones  - CT with renal calculi bilaterally. Had some previous hematuria. None recently  Now followed by Dr Tresa Moore at [facility].  Total time spent 45 minutes. Over half that time spent discussing above.    [name], MD  01/21/2022

## 2022-01-26 ENCOUNTER — Ambulatory Visit (HOSPITAL_COMMUNITY): Payer: Self-pay | Admitting: Pharmacist

## 2022-01-26 ENCOUNTER — Encounter: Payer: Self-pay | Admitting: Nurse Practitioner

## 2022-01-26 ENCOUNTER — Ambulatory Visit (INDEPENDENT_AMBULATORY_CARE_PROVIDER_SITE_OTHER): Payer: Medicare Other | Admitting: Nurse Practitioner

## 2022-01-26 ENCOUNTER — Ambulatory Visit (HOSPITAL_COMMUNITY)
Admission: RE | Admit: 2022-01-26 | Discharge: 2022-01-26 | Disposition: A | Discharge status: Home / Self Care | Payer: Medicare Other | Source: Ambulatory Visit | Attending: Cardiology | Admitting: Cardiology

## 2022-01-26 VITALS — BP 104/62 | HR 82 | Ht 70.0 in | Wt 188.0 lb

## 2022-01-26 DIAGNOSIS — R1310 Dysphagia, unspecified: Secondary | ICD-10-CM | POA: Diagnosis not present

## 2022-01-26 DIAGNOSIS — Z95811 Presence of heart assist device: Secondary | ICD-10-CM | POA: Insufficient documentation

## 2022-01-26 DIAGNOSIS — Z7901 Long term (current) use of anticoagulants: Secondary | ICD-10-CM | POA: Diagnosis not present

## 2022-01-26 LAB — LACTATE DEHYDROGENASE: LDH: 298 U/L — ABNORMAL HIGH (ref 98–192)

## 2022-01-26 LAB — PROTIME-INR
INR: 1.7 — ABNORMAL HIGH (ref 0.8–1.2)
Prothrombin Time: 20 seconds — ABNORMAL HIGH (ref 11.4–15.2)

## 2022-01-26 MED ORDER — PANTOPRAZOLE SODIUM 40 MG PO TBEC: 40.0000 mg | DELAYED_RELEASE_TABLET | Freq: Two times a day (BID) | ORAL | 1 refills | Status: DC

## 2022-01-26 NOTE — Progress Notes (Addendum)
01/26/2022 Scott [name] [id number] Apr 02, 1946   CHIEF COMPLAINT: Difficulty swallowing  HISTORY OF PRESENT ILLNESS: Scott [name] is a 77 year old male with a past medical history of coronary artery disease, coronary artery disease s/p Des 2013 on Plavix s/p MI 73/53/2992 s/p PCI complicated by cardiogenic shock required IABP, VT s/p cardioversion and NSTEMI 12/26/215, s/p LVAD placement 08/2013, LBBB, chronic systolic CHF, ischemic cardiomyopathy with LVEF < 20% , recurrent VT/VFs/p biventricular ICD implantation 10/2021.on Coumadin, DM II, bilateral PE/DVT s/p IVC filter, carotid artery disease, iron deficiency anemia and ankylosing spondylitis. S/P cholecystectomy secondary to gallstone pancreatitis 01/2017.   He presents to our office today as referred by Dr. Haroldine Laws for further evaluation regarding dysphagia.  He described having minor difficulty swallowing solid foods which occurred once every 4 to 6 weeks for the past 2 to 3 years, felt a slight tightening and pain to the upper mid esophagus when eating which abated after he stopped eating for about 30 seconds.  In June 2023, he began having more frequent episodes of dysphagia and pain to the upper mid esophagus which lasted about 2 to 3 minutes which occurred daily.  He denied gagging or vomiting out the stuck food.  He started eating softer foods and avoided bread and meat in his dysphagia and esophageal discomfort symptoms abated a few weeks ago without recurrence.  He takes Pantoprazole 40 mg once daily since 2016 while on  ASA, Plavix and Coumadin.  He underwent a barium swallow study 01/03/2022 which showed narrowing to the distal esophagus/GE junction, passage of the barium tablet was delayed to this area concerning for stricture, mild to moderate esophageal dysmotility with tertiary contractions and a moderate volume of gastroesophageal reflux was observed to the upper esophagus during the study.  No fever, sweats or  chills.  No weight loss.  He is passing a normal formed brown bowel movement daily.  No rectal bleeding or black stools.  He is on chronic antibiotics for several years.  He was previously on Doxycycline then Cephalexin 500 mg twice daily for the past 2 to 3 years to prevent infection to the LVAD driveline site which has chronic bleeding.  He underwent a colonoscopy in 1997 which showed a normal colon and rectum, mild anal erythema was noted.     He has iron deficiency anemia followed by Dr. Haroldine Laws.  He last received IV Feraheme 08/2021.     Latest Ref Rng & Units 01/12/2022    9:04 AM 12/29/2021    9:22 AM 12/25/2021    9:33 AM  CBC  WBC 4.0 - 10.5 K/uL 8.1  11.2  10.4   Hemoglobin 13.0 - 17.0 g/dL 11.1  11.3  11.5   Hematocrit 39.0 - 52.0 % 36.9  36.6  37.7   Platelets 150 - 400 K/uL 345  400  404   MCV 91.8. RDW 17.3.  Iron 57.  TIBC 469 on 09/13/2021.     Latest Ref Rng & Units 01/12/2022    9:04 AM 12/29/2021    9:22 AM 12/20/2021    2:17 AM  CMP  Glucose 70 - 99 mg/dL 108  139  143   BUN 8 - 23 mg/dL '16  15  24   '$ Creatinine 0.61 - 1.24 mg/dL 1.13  1.12  1.06   Sodium 135 - 145 mmol/L 140  139  139   Potassium 3.5 - 5.1 mmol/L 4.2  4.5  4.4   Chloride 98 - 111  mmol/L 106  104  105   CO2 22 - 32 mmol/L '27  28  27   '$ Calcium 8.9 - 10.3 mg/dL 8.8  9.3  9.1   Total Protein 6.5 - 8.1 g/dL  7.3    Total Bilirubin 0.3 - 1.2 mg/dL  1.3    Alkaline Phos 38 - 126 U/L  137    AST 15 - 41 U/L  38    ALT 0 - 44 U/L  24       ECHO 10/2021: IMPRESSIONS Left ventricular ejection fraction, by estimation, is <20%. The left ventricle has severely decreased function. The left ventricle demonstrates global hypokinesis. The left ventricular internal cavity size was severely dilated. Left ventricular diastolic parameters are indeterminate. 1. 2. S/p HMII LVAD. Inflow cannula now well visualized Right ventricular systolic function is severely reduced. The right ventricular size is moderately  enlarged. 3. 4. Left atrial size was moderately dilated. 5. Right atrial size was severely dilated. 6. The mitral valve is normal in structure. Mild mitral valve regurgitation. The aortic valve was not well visualized. Aortic valve regurgitation is mild to moderate. No AV opening seen 7. 8. Aortic dilatation noted. There is dilatation of the ascending aorta, measuring 43 mm.  Colonoscopy 1997:   Past Medical History:  Diagnosis Date   Acute on chronic respiratory failure ([place])    a. 08/2014 in setting of PE.   Ankylosing spondylitis ([place])    Bilateral pulmonary embolism ([place])    a. 08/2014 - started on Coumadin. Retrievable IVC filter placed 08/27/14 due to RV strain and large clot burden.   CAD (coronary artery disease)    a. stenting of LCx 2013; b. STEMI 06/12/14 s/p PCI to LAD complicated by post cath shock requiring IABP; VT s/p DCCV, EF 20%; c. NSTEMI 06/26/14 treated medically.   Carotid artery disease ([place])    a. s/p stenting.   Chronic systolic CHF (congestive heart failure) ([place])    Diabetes ([place])    Hemoptysis    a. 08/2014 possibly due to pulm infarct/PE.   Hyperlipidemia    Hypertension    Hypotension    Ischemic cardiomyopathy    a. echo 08/23/2014 EF <20%, dilated CM, mod MR/TR   Leukocytosis    Leukocytosis 09/10/2014   Nephrolithiasis    Pleural effusion on right 08/2014 - small   Reactive thrombocytosis 09/10/2014   Right leg DVT ([place])    a. 08/2014.   Ventricular tachycardia ([place])    a. 06/2014 at time of MI, s/p DCCV.   Past Surgical History:  Procedure Laterality Date   BEDSIDE EVACUATION OF HEMATOMA  10/05/2014   Procedure: EVACUATION OF HEMATOMA;  Surgeon: Ivin Poot, MD;  Location: [place];  Service: Open Heart Surgery;;   CHOLECYSTECTOMY N/A 02/21/2017   Procedure: LAPAROSCOPIC CHOLECYSTECTOMY;  Surgeon: Rolm Bookbinder, MD;  Location: [place];  Service: General;  Laterality: N/A;   ICD IMPLANT N/A 11/14/2021   Procedure: ICD IMPLANT;  Surgeon:  Constance Haw, MD;  Location: [place] CV LAB;  Service: Cardiovascular;  Laterality: N/A;   INSERTION OF IMPLANTABLE LEFT VENTRICULAR ASSIST DEVICE N/A 09/21/2014   Procedure: INSERTION OF IMPLANTABLE LEFT VENTRICULAR ASSIST DEVICE;  Surgeon: Ivin Poot, MD;  Location: [place];  Service: Open Heart Surgery;  Laterality: N/A;  CIRC ARREST  NITRIC OXIDE   LEFT HEART CATHETERIZATION WITH CORONARY ANGIOGRAM N/A 06/12/2014   Procedure: LEFT HEART CATHETERIZATION WITH CORONARY ANGIOGRAM;  Surgeon: Lorretta Harp, MD;  Location: [facility] CATH  LAB;  Service: Cardiovascular;  Laterality: N/A;   RIGHT HEART CATHETERIZATION N/A 09/10/2014   Procedure: RIGHT HEART CATH;  Surgeon: [name], MD;  Location: [facility] CATH LAB;  Service: Cardiovascular;  Laterality: N/A;   RIGHT HEART CATHETERIZATION N/A 09/17/2014   Procedure: RIGHT HEART CATH;  Surgeon: [name], MD;  Location: [facility] CATH LAB;  Service: Cardiovascular;  Laterality: N/A;   TEE WITHOUT CARDIOVERSION N/A 09/21/2014   Procedure: TRANSESOPHAGEAL ECHOCARDIOGRAM (TEE);  Surgeon: Ivin Poot, MD;  Location: [place];  Service: Open Heart Surgery;  Laterality: N/A;   TEE WITHOUT CARDIOVERSION N/A 10/05/2014   Procedure: TRANSESOPHAGEAL ECHOCARDIOGRAM (TEE);  Surgeon: Ivin Poot, MD;  Location: [place];  Service: Open Heart Surgery;  Laterality: N/A;   VIDEO ASSISTED THORACOSCOPY (VATS)/THOROCOTOMY Left 10/06/2014   Procedure: VIDEO ASSISTED THORACOSCOPY (VATS)/THOROCOTOMY;  Surgeon: Ivin Poot, MD;  Location: [place];  Service: Open Heart Surgery;  Laterality: Left;   Social History: He is married.  He is retired.  Quit smoking cigarettes 50 years ago.  No alcohol use.  No drug use.  Family History: Father had heart disease.  Sister had ulcerative colitis or Crohn's colitis.  No known family history of esophageal, gastric or colon cancer.   Allergies  Allergen Reactions   Biopatch [Chlorhexidine] Dermatitis and Rash   Desyrel  [Trazodone] Other (See Comments)    insomnia   Other Dermatitis    Adhesive      Outpatient Encounter Medications as of 01/26/2022  Medication Sig   acetaminophen (TYLENOL) 500 MG tablet Take 1,000 mg by mouth every 6 (six) hours as needed for moderate pain.    amiodarone (PACERONE) 200 MG tablet Take 1 tablet (200 mg total) by mouth daily.   aspirin EC 81 MG tablet Take 1 tablet (81 mg total) by mouth daily. (Patient not taking: Reported on 12/29/2021)   cephALEXin (KEFLEX) 500 MG capsule Take 1 capsule (500 mg total) by mouth 2 (two) times daily.   clobetasol ointment (TEMOVATE) 8.09 % Apply 1 application topically 2 (two) times daily.   clopidogrel (PLAVIX) 75 MG tablet Take 1 tablet (75 mg total) by mouth daily.   cyclobenzaprine (FLEXERIL) 5 MG tablet Take 1 tablet (5 mg total) by mouth 3 (three) times daily as needed for muscle spasms.   furosemide (LASIX) 20 MG tablet Take 2 tablets (40 mg total) by mouth daily. Take 40 mg daily (Patient taking differently: Take 40 mg by mouth daily. Take 40 mg daily)   pantoprazole (PROTONIX) 40 MG tablet Take 1 tablet (40 mg total) by mouth at bedtime. (Patient taking differently: Take 40 mg by mouth daily.)   potassium chloride SA (KLOR-CON) 20 MEQ tablet Every other day alternate 20 mEq (1 tablet) with 40 mEq (2 tablets) (Patient taking differently: 40 mEq See admin instructions. 20 mEq daily WITH furosemide (take 40 mEq if taking 40 mg of furosemide))   rosuvastatin (CRESTOR) 10 MG tablet Take 10 mg by mouth every Monday, Wednesday, and Friday.   traMADol (ULTRAM) 50 MG tablet Take 2 tablets (100 mg total) by mouth every 6 (six) hours as needed for moderate pain. (Patient taking differently: Take 50 mg by mouth daily as needed for moderate pain.)   warfarin (COUMADIN) 6 MG tablet TAKE 1 TABLET BY MOUTH EVERY DAY (Patient taking differently: Take 6 mg by mouth daily at 4 PM. TAKE 1/2 TABLET BY MOUTH EVERY DAY OR AS DIRECTED BY HEART FAILURE CLINIC)    No facility-administered encounter medications  on file as of 01/26/2022.    REVIEW OF SYSTEMS:  Gen: Denies fever, sweats or chills. No weight loss.  CV: Denies chest pain, palpitations or edema. Resp: Denies cough, shortness of breath of hemoptysis.  GI: See HPI. GU : Denies urinary burning, blood in urine, increased urinary frequency or incontinence. MS: Denies joint pain, muscles aches or weakness. Derm: Denies rash, itchiness, skin lesions or unhealing ulcers. Psych: Denies depression, anxiety or memory loss. Heme: Denies bruising, easy bleeding. Neuro:  Denies headaches, dizziness or paresthesias. Endo:  Denies any problems with DM, thyroid or adrenal function.  PHYSICAL EXAM: BP 104/62   Pulse 82   Ht '5\' 10"'$  (1.778 m)   Wt 188 lb (85.3 kg)   SpO2 98%   BMI 26.98 kg/m   General: 76 year old male in no acute distress. Head: Normocephalic and atraumatic. Eyes:  Sclerae non-icteric, conjunctive pink. Ears: Normal auditory acuity. Mouth: Dentition intact. No ulcers or lesions.  Neck: Supple, no lymphadenopathy or thyromegaly.  Lungs: Clear bilaterally to auscultation without wheezes, crackles or rhonchi. Heart: Regular rate and rhythm.  LVAD hum present. Abdomen: Soft, nontender, non distended.  LVAD insertion site to the RUQ area, scant amount of dried blood around insertion site.  No hepatosplenomegaly. Normoactive bowel sounds x 4 quadrants.  Rectal: Deferred. Musculoskeletal: Symmetrical with no gross deformities. Skin: Warm and dry. No rash or lesions on visible extremities. Extremities: No edema. Neurological: Alert oriented x 4, no focal deficits.  Psychological:  Alert and cooperative. Normal mood and affect.  ASSESSMENT AND PLAN:  52) 76 year old male with dysphagia.  Barium swallow study showed evidence of distal esophageal stricture, upper esophageal reflux and esophageal dysmotility.  On Pantoprazole 40 mg p.o. daily. On chronic antibiotics, at risk for  esophageal candidiasis. -Increase Pantoprazole 40 mg p.o. twice daily -EGD deferred at this time as his dysphagia symptoms abated after diet changes and he is cutting food in smaller pieces and chewing food thoroughly.   -He is clearly at a higher risk for procedure/sedation complications secondary to multiple comorbidities including significant heart disease/ischemic cardiomyopathy on LVAD requiring ASA, Plavix and Coumadin PE/DVT.  -His distal esophageal stricture per barium swallow study may be benign but malignancy cannot be excluded without endoscopic evaluation with biopsies.  I will consult with Dr. Tarri Glenn to review his case.  If his dysphagia symptoms progressively worsen and he is unable to tolerate food/liquids p.o. an expedited EGD at [facility] with anticoagulation management per cardiology would be warranted.   -Follow-up with Dr. Tarri Glenn in 4 weeks  2) Chronic IDA anemia.  Last received Feraheme 09/27/2021. No overt GI bleeding.  Chronic LVAD driveline site bleeding likely a contributing factor. -Recommend iron panel and vitamin B12 level with next lab draw -Defer endoscopic evaluation to Dr. Tarri Glenn  3) Significant history of coronary artery disease with past MI, s/p DES, s/p LVAD placement, recurrent VT s/p biventricular ICD implantation 10/2021 and ischemic cardiomyopathy. EF < 20%. On Plavix, Coumadin, ASA, Amiodarone and Furosemide.  4) History of bilateral PE/DVT on Coumadin  5) Colon cancer screen -Colonoscopy deferred  ADDENDUM: Per Dr. Haroldine Laws 01/31/2022 " FYI. From a procedural standpoint he is stable with his VAD and would be able to undergo EGD if needed to further assess. He was quite symptomatic originally".   CC:  Bensimhon, Shaune Pascal, MD

## 2022-01-26 NOTE — Progress Notes (Signed)
LVAD INR 

## 2022-01-26 NOTE — Patient Instructions (Signed)
We have sent the following medications to your pharmacy for you to pick up at your convenience:   Protonix twice daily 30 minutes before breakfast and dinner  Call office if symptoms worsen   If you are age 74 or older, your body mass index should be between 23-30. Your Body mass index is 26.98 kg/m. If this is out of the aforementioned range listed, please consider follow up with your Primary Care Provider.  If you are age 17 or younger, your body mass index should be between 19-25. Your Body mass index is 26.98 kg/m. If this is out of the aformentioned range listed, please consider follow up with your Primary Care Provider.   ________________________________________________________  The [place] [facility] providers would like to encourage you to use [facility] to communicate with providers for non-urgent requests or questions.  Due to long hold times on the telephone, sending your provider a message by [facility] may be a faster and more efficient way to get a response.  Please allow 48 business hours for a response.  Please remember that this is for non-urgent requests.  _______________________________________________________   I appreciate the  opportunity to care for you  Thank You   Marcella Dubs

## 2022-02-05 ENCOUNTER — Telehealth: Payer: Self-pay

## 2022-02-05 NOTE — Progress Notes (Signed)
Dr. Tarri Glenn, will you be doing a "diagnostic EGD" as patient is on Plavix and Coumadin?

## 2022-02-05 NOTE — Telephone Encounter (Signed)
Thornton Park, MD  Noralyn Pick, NP; Carl Best, RN  I recommend PT/INR stat at the hospital prior to his procedure.   KLB   Previous Messages    ----- Message -----  From: Noralyn Pick, NP  Sent: 02/05/2022   7:27 AM EDT  To: Thornton Park, MD; Carl Best, RN   Scott [name], pls contact patient and schedule him for a diagnostic EGD with Dr. Tarri Glenn at [facility] as patient has LVAD. Dr. Tarri Glenn pls let Donita Brooks know if PT/INR to be done stat day of procedure or to do PT/INR one day before procedure in our lab. Note, patient has an appt with Dr. Tarri Glenn in clinic 8/30. If he is not wishing to pursue EGD at this time, he can discuss it  further with Dr. Tarri Glenn at he time of his follow up appt.   ----- Message -----  From: Thornton Park, MD  Sent: 02/05/2022   7:20 AM EDT  To: Noralyn Pick, NP   Yes. It would be diagnostic without dilation.   KLB  ----- Message -----  From: Noralyn Pick, NP  Sent: 02/05/2022   7:10 AM EDT  To: Thornton Park, MD; Carl Best, RN      ----- Message -----  From: Thornton Park, MD  Sent: 02/01/2022   8:27 AM EDT  To: [name], MD; *   Many thanks for your input. We will work to schedule an outpatient EGD at [facility].   KLB  ----- Message -----  From: [name], MD  Sent: 01/31/2022   9:32 PM EDT  To: Thornton Park, MD; *   Thanks.   FYI. From a procedural standpoint he is stable with his VAD and would be able to undergo EGD if needed to further assess. He was quite symptomatic originally.    ----- Message -----  From: Noralyn Pick, NP  Sent: 01/26/2022  12:58 PM EDT  To: [name], MD

## 2022-02-05 NOTE — Telephone Encounter (Signed)
Thornton Park, MD  Carl Best, RN  Donita Brooks,   I would be happy to perform on Monday morning of my next hospital week. These procedure will need to be performed at [facility] because of his LVAD.   Thanks.   KLB

## 2022-02-06 NOTE — Telephone Encounter (Signed)
Refer to alternate phone note.

## 2022-02-06 NOTE — Telephone Encounter (Signed)
Left message for patient to call back  

## 2022-02-07 NOTE — Telephone Encounter (Signed)
Patients wife is returning your call

## 2022-02-07 NOTE — Telephone Encounter (Signed)
Spoke with patient's wife regarding EGD. At this time patient does not want to proceed with EGD despite NP & MD recommendations. She says that he is experiencing little discomfort and would prefer to hold off on procedure given cardiac history. She has been advised to keep follow up scheduled for 8/30 with Dr. Tarri Glenn to discuss and evaluate symptoms further then, however she is unsure if patient will keep that appointment and will call back to cancel/reschedule depending on how his cardiac appointments go next week.

## 2022-02-09 ENCOUNTER — Other Ambulatory Visit (HOSPITAL_COMMUNITY): Payer: Self-pay | Admitting: Unknown Physician Specialty

## 2022-02-09 DIAGNOSIS — E611 Iron deficiency: Secondary | ICD-10-CM

## 2022-02-09 DIAGNOSIS — Z95811 Presence of heart assist device: Secondary | ICD-10-CM

## 2022-02-09 DIAGNOSIS — Z7901 Long term (current) use of anticoagulants: Secondary | ICD-10-CM

## 2022-02-12 ENCOUNTER — Ambulatory Visit (HOSPITAL_COMMUNITY)
Admission: RE | Admit: 2022-02-12 | Discharge: 2022-02-12 | Disposition: A | Discharge status: Home / Self Care | Payer: Medicare Other | Source: Ambulatory Visit | Attending: Internal Medicine | Admitting: Internal Medicine

## 2022-02-12 ENCOUNTER — Ambulatory Visit (HOSPITAL_COMMUNITY): Payer: Self-pay | Admitting: Pharmacist

## 2022-02-12 ENCOUNTER — Encounter (HOSPITAL_COMMUNITY): Payer: Self-pay

## 2022-02-12 ENCOUNTER — Other Ambulatory Visit (HOSPITAL_COMMUNITY): Payer: Self-pay | Admitting: *Deleted

## 2022-02-12 VITALS — BP 94/73 | HR 80 | Wt 190.0 lb

## 2022-02-12 DIAGNOSIS — E611 Iron deficiency: Secondary | ICD-10-CM

## 2022-02-12 DIAGNOSIS — M459 Ankylosing spondylitis of unspecified sites in spine: Secondary | ICD-10-CM | POA: Diagnosis not present

## 2022-02-12 DIAGNOSIS — Z7901 Long term (current) use of anticoagulants: Secondary | ICD-10-CM | POA: Diagnosis not present

## 2022-02-12 DIAGNOSIS — Z95811 Presence of heart assist device: Secondary | ICD-10-CM | POA: Insufficient documentation

## 2022-02-12 DIAGNOSIS — Z8674 Personal history of sudden cardiac arrest: Secondary | ICD-10-CM | POA: Insufficient documentation

## 2022-02-12 DIAGNOSIS — E8729 Other acidosis: Secondary | ICD-10-CM | POA: Diagnosis not present

## 2022-02-12 DIAGNOSIS — K222 Esophageal obstruction: Secondary | ICD-10-CM | POA: Diagnosis not present

## 2022-02-12 DIAGNOSIS — R1310 Dysphagia, unspecified: Secondary | ICD-10-CM

## 2022-02-12 DIAGNOSIS — I447 Left bundle-branch block, unspecified: Secondary | ICD-10-CM | POA: Insufficient documentation

## 2022-02-12 DIAGNOSIS — I110 Hypertensive heart disease with heart failure: Secondary | ICD-10-CM | POA: Insufficient documentation

## 2022-02-12 DIAGNOSIS — I5022 Chronic systolic (congestive) heart failure: Secondary | ICD-10-CM | POA: Diagnosis not present

## 2022-02-12 DIAGNOSIS — E119 Type 2 diabetes mellitus without complications: Secondary | ICD-10-CM | POA: Insufficient documentation

## 2022-02-12 DIAGNOSIS — I472 Ventricular tachycardia, unspecified: Secondary | ICD-10-CM | POA: Diagnosis not present

## 2022-02-12 DIAGNOSIS — I2510 Atherosclerotic heart disease of native coronary artery without angina pectoris: Secondary | ICD-10-CM | POA: Diagnosis not present

## 2022-02-12 DIAGNOSIS — Z79899 Other long term (current) drug therapy: Secondary | ICD-10-CM | POA: Insufficient documentation

## 2022-02-12 DIAGNOSIS — I252 Old myocardial infarction: Secondary | ICD-10-CM | POA: Diagnosis not present

## 2022-02-12 DIAGNOSIS — N200 Calculus of kidney: Secondary | ICD-10-CM | POA: Insufficient documentation

## 2022-02-12 DIAGNOSIS — Z955 Presence of coronary angioplasty implant and graft: Secondary | ICD-10-CM | POA: Diagnosis not present

## 2022-02-12 DIAGNOSIS — R7402 Elevation of levels of lactic acid dehydrogenase (LDH): Secondary | ICD-10-CM | POA: Diagnosis not present

## 2022-02-12 DIAGNOSIS — Z86711 Personal history of pulmonary embolism: Secondary | ICD-10-CM | POA: Insufficient documentation

## 2022-02-12 DIAGNOSIS — I255 Ischemic cardiomyopathy: Secondary | ICD-10-CM | POA: Insufficient documentation

## 2022-02-12 LAB — CBC
HCT: 37.2 % — ABNORMAL LOW (ref 39.0–52.0)
Hemoglobin: 11.3 g/dL — ABNORMAL LOW (ref 13.0–17.0)
MCH: 26.2 pg (ref 26.0–34.0)
MCHC: 30.4 g/dL (ref 30.0–36.0)
MCV: 86.1 fL (ref 80.0–100.0)
Platelets: 347 10*3/uL (ref 150–400)
RBC: 4.32 MIL/uL (ref 4.22–5.81)
RDW: 17.8 % — ABNORMAL HIGH (ref 11.5–15.5)
WBC: 8.6 10*3/uL (ref 4.0–10.5)
nRBC: 0 % (ref 0.0–0.2)

## 2022-02-12 LAB — BASIC METABOLIC PANEL
Anion gap: 7 (ref 5–15)
BUN: 18 mg/dL (ref 8–23)
CO2: 28 mmol/L (ref 22–32)
Calcium: 9.5 mg/dL (ref 8.9–10.3)
Chloride: 104 mmol/L (ref 98–111)
Creatinine, Ser: 1.24 mg/dL (ref 0.61–1.24)
GFR, Estimated: >60 mL/min (ref >60)
Glucose, Bld: 140 mg/dL — ABNORMAL HIGH (ref 70–99)
Potassium: 4.9 mmol/L (ref 3.5–5.1)
Sodium: 139 mmol/L (ref 135–145)

## 2022-02-12 LAB — FOLATE: Folate: 13.1 ng/mL (ref >5.9)

## 2022-02-12 LAB — IRON AND TIBC
Iron: 31 ug/dL — ABNORMAL LOW (ref 45–182)
Saturation Ratios: 6 % — ABNORMAL LOW (ref 17.9–39.5)
TIBC: 554 ug/dL — ABNORMAL HIGH (ref 250–450)
UIBC: 523 ug/dL

## 2022-02-12 LAB — VITAMIN B12: Vitamin B-12: 521 pg/mL (ref 180–914)

## 2022-02-12 LAB — LACTATE DEHYDROGENASE: LDH: 284 U/L — ABNORMAL HIGH (ref 98–192)

## 2022-02-12 LAB — PROTIME-INR
INR: 2.2 — ABNORMAL HIGH (ref 0.8–1.2)
Prothrombin Time: 23.8 seconds — ABNORMAL HIGH (ref 11.4–15.2)

## 2022-02-12 LAB — FERRITIN: Ferritin: 23 ng/mL — ABNORMAL LOW (ref 24–336)

## 2022-02-12 NOTE — Progress Notes (Addendum)
Patient presents for 1 month follow up in [facility] today with wife. Reports no problems with VAD equipment or drive line.   Patient arrived ambulatory, no assistance required. He says he is feeling better. He denies lightheadedness, dizziness, syncope, palpitations, ICD shocks, falls, and signs of bleeding. Reports baseline shortness of breath.  Appetite is back to normal.  Right upper quadrant abdominal pain from esophageal stricture has resolved. Had GI appt 01/26/22. GI recommended endoscopy. Pt refused at this time as his GI symptoms have resolved. Also recommended taking Protonix BID. Pt prefers to take once daily. Has GI f/u scheduled 8/30. Discussed with Dr Haroldine Laws.  Will cancel f/u as pt is asymptomatic at this time.   Taking Amiodarone 200 mg daily. Medtronic device interrogated- no VT/VF noted. Patient has f/u with EP on 02/23/22.   Per [name] pt's drive line has greatly improved and she is changing dressing weekly.   Will continue to hold ASA per Dr Haroldine Laws as LDH has been stable. Has been held since 12/22/21 due to nosebleeds.   Vital Signs:  Temp:  Doppler Pressure: 78 Automatc BP: 94/73 (81) HR: 80 SPO2: 98% RA   Weight: 190 lbs w/ eqt Last weight: 188.2 lbs Home weights: 180 - 182 lbs  VAD Indication: Destination Therapy patient choice     VAD interrogation & Equipment Management: Speed: 9200 Flow: 4.6 Power:  5.2 w    PI: 5.0   Alarms: few LV Events: 0-5  Fixed speed 9200 Low speed limit: 8600   Primary controller: Replace back-up battery in 12 months Back up controller: Replace back-up battery in 14 months   Annual Equipment Maintenance on UBC/PM was performed on 08/30/2021.   I reviewed the LVAD parameters from today and compared the results to the patient's prior recorded data. LVAD interrogation was NEGATIVE for significant power changes, NEGATIVE for clinical alarms and STABLE for PI events/speed drops. No programming changes were made and pump is  functioning within specified parameters. Pt is performing daily controller and system monitor self tests along with completing weekly and monthly maintenance for LVAD equipment.   LVAD equipment check completed and is in good working order. Back-up equipment present.    Exit Site Care: Drive line is being maintained weekly or as needed by his wife using daily dressing kits. Dressing dry and intact. Stabilization device present and accurately applied. Pt denies fever or chills. Pt provided with 10 anchors and a box of saline wipes for home use. Otherwise has adequate supplies at home.  Device: Medtronic BiV - Therapy: on 250 bpm -  Pacing:  DDD80 -  Last check:  11/15/21  BP & Labs:  MAP 78 - Doppler is reflecting MAP   Hgb 11.3 - No S/S of bleeding. Specifically denies melena/BRBPR or nosebleeds.    LDH 284 and is within established baseline of 250- 360. Pt denies tea-colored urine. No power elevations noted on interrogation. We will continue to check this with his INR. Holding ASA currently.   Patient Instructions:  No medication changes  Coumadin dosing per [name] PharmD Return to clinic in 2 month for follow up with Dr Haroldine Laws. Will completed 7.5 yr Intermacs with 6 minute walk (if able) at this visit. Return to clinic in 2 weeks for INR/LDH   Emerson Monte RN [place] Coordinator  Office: [phone]  24/7 Pager: [phone]

## 2022-02-12 NOTE — Progress Notes (Signed)
Patient ID: Scott [name], male   DOB: Jan 25, 1946, 76 y.o.   MRN: [id number]    LVAD CLINIC NOTE  Primary Cardiologist: Dr Haroldine Laws INR : [facility]  HPI: Scott [name] is a 76 y/o male with h/o CAD with ankylosing spondylitis, DM2, carotid stenting, severe ischemic CM s/p anterior STEM with VT arrest in 12/15, large PE with pulmonary infarct in 2/15 s/p IVC filter and systolic HF with EF 59%.  He underwent HM II LVAD placement on 09/20/13. He had a prolonged hospital course due to recurrent chest bleeding, severe CO2 retention and RV failure.  In 8/18 admitted with acute gallstone pancreatitis. Underwent lap chole 02/21/17.   Admitted 2/18 with elevated LDH. He was started on bivalirudin. LDH went down [phone].   Admitted 10/30/21 with VT/VF with shock. Underwent emergent DC-CV. Required inotrope support with milrinone which was successfully weaned. Diuresed with IV lasix. Had AKI with recovery in renal function prior to discharge.   Readmitted 5/23 with recurrent VT. Given IV fluids. Underwent shock x1 complicated by CHB and hypotension. Developed several more episodes of VF while in hospital requiring DC-CV. Remained in CHB. EP consulted. Had CRT-D ICD placed with LBBB lead. D/d on po amio  Here for f/u with [name]. Recently seen for sick visit and was having problems with food getting stuck in his esophagus. Barium swallow showed distal stricture. Now cutting up his food and doing better. Saw GI and discussed EGD but he now says he is doing better and not having significant symptoms so he wants to defer. Denies any ICD firings. Having to take lasix every day. Denies orthopnea or PND. No fevers, chills or problems with driveline. No bleeding, melena or neuro symptoms. No VAD alarms. Taking all meds as prescribed.     VAD Indication: Destination Therapy patient choice     VAD interrogation & Equipment Management: Speed: 9200 Flow: 4.6 Power:  5.2 w    PI: 5.1   Alarms: few  LV Events: 5-10  Fixed speed 9200 Low speed limit: 8600   Primary controller: Replace back-up battery in 13 months Back up controller: Replace back-up battery in 14 months    Annual Equipment Maintenance on UBC/PM was performed on 08/30/2021.   I reviewed the LVAD parameters from today and compared the results to the patient's prior recorded data. LVAD interrogation was NEGATIVE for significant power changes, NEGATIVE for clinical alarms and STABLE for PI events/speed drops. No programming changes were made and pump is functioning within specified parameters. Pt is performing daily controller and system monitor self tests along with completing weekly and monthly maintenance for LVAD equipment.   LVAD equipment check completed and is in good working order. Back-up equipment present.   SH:  Social History   Socioeconomic History   Marital status: Married    Spouse name: Not on file   Number of children: 0   Years of education: Not on file   Highest education level: Not on file  Occupational History   Occupation: retired  Tobacco Use   Smoking status: Former    Packs/day: 0.30    Years: 0.00    Total pack years: 0.00    Types: Cigarettes    Quit date: 06/12/1969    Years since quitting: 52.7   Smokeless tobacco: Never   Tobacco comments:    pt smoked while in [employer] 2-3 cig x 6 months.  Vaping Use   Vaping Use: Former  Substance and Sexual Activity   Alcohol use: No  Alcohol/week: 0.0 standard drinks of alcohol   Drug use: No   Sexual activity: Not Currently  Other Topics Concern   Not on file  Social History Narrative   Not on file   Social Determinants of Health   Financial Resource Strain: Not on file  Food Insecurity: Not on file  Transportation Needs: Not on file  Physical Activity: Not on file  Stress: Not on file  Social Connections: Not on file  Intimate Partner Violence: Not on file    FH:  Family History  Problem Relation Age of Onset    Hypertension Father    Heart attack Father    Heart Problems Sister    Colon cancer Neg Hx    Stomach cancer Neg Hx    Esophageal cancer Neg Hx    Colon polyps Neg Hx     Past Medical History:  Diagnosis Date   Acute on chronic respiratory failure ([place])    a. 08/2014 in setting of PE.   Ankylosing spondylitis ([place])    Bilateral pulmonary embolism ([place])    a. 08/2014 - started on Coumadin. Retrievable IVC filter placed 08/27/14 due to RV strain and large clot burden.   CAD (coronary artery disease)    a. stenting of LCx 2013; b. STEMI 06/12/14 s/p PCI to LAD complicated by post cath shock requiring IABP; VT s/p DCCV, EF 20%; c. NSTEMI 06/26/14 treated medically.   Carotid artery disease ([place])    a. s/p stenting.   Chronic systolic CHF (congestive heart failure) ([place])    Diabetes ([place])    Hemoptysis    a. 08/2014 possibly due to pulm infarct/PE.   Hyperlipidemia    Hypertension    Hypotension    Ischemic cardiomyopathy    a. echo 08/23/2014 EF <20%, dilated CM, mod MR/TR   Leukocytosis    Leukocytosis 09/10/2014   Nephrolithiasis    Pleural effusion on right 08/2014 - small   Reactive thrombocytosis 09/10/2014   Right leg DVT ([place])    a. 08/2014.   Ventricular tachycardia ([place])    a. 06/2014 at time of MI, s/p DCCV.   Current Outpatient Medications  Medication Sig Dispense Refill   acetaminophen (TYLENOL) 500 MG tablet Take 1,000 mg by mouth every 6 (six) hours as needed for moderate pain.      amiodarone (PACERONE) 200 MG tablet Take 1 tablet (200 mg total) by mouth daily. 120 tablet 3   cephALEXin (KEFLEX) 500 MG capsule Take 1 capsule (500 mg total) by mouth 2 (two) times daily. 270 capsule 3   clobetasol ointment (TEMOVATE) 3.47 % Apply 1 application topically 2 (two) times daily. (Patient taking differently: Apply 1 application  topically 2 (two) times daily. As needed) 30 g 2   clopidogrel (PLAVIX) 75 MG tablet Take 1 tablet (75 mg total) by mouth daily. 90 tablet 3    cyclobenzaprine (FLEXERIL) 5 MG tablet Take 1 tablet (5 mg total) by mouth 3 (three) times daily as needed for muscle spasms. 90 tablet 5   furosemide (LASIX) 20 MG tablet Take 2 tablets (40 mg total) by mouth daily. Take 40 mg daily 120 tablet 3   pantoprazole (PROTONIX) 40 MG tablet Take 1 tablet (40 mg total) by mouth 2 (two) times daily before a meal. (Patient taking differently: Take 40 mg by mouth 2 (two) times daily before a meal.) 60 tablet 1   potassium chloride SA (KLOR-CON) 20 MEQ tablet Every other day alternate 20 mEq (1 tablet) with 40 mEq (  2 tablets) (Patient taking differently: 40 mEq See admin instructions. 20 mEq daily WITH furosemide (take 40 mEq if taking 40 mg of furosemide)) 135 tablet 3   rosuvastatin (CRESTOR) 10 MG tablet Take 10 mg by mouth every Monday, Wednesday, and Friday.     traMADol (ULTRAM) 50 MG tablet Take 2 tablets (100 mg total) by mouth every 6 (six) hours as needed for moderate pain. (Patient taking differently: Take 50 mg by mouth daily as needed for moderate pain.) 120 tablet 3   warfarin (COUMADIN) 6 MG tablet TAKE 1 TABLET BY MOUTH EVERY DAY (Patient taking differently: Take 6 mg by mouth daily at 4 PM. TAKE 1 TABLET (6 mg) on FRIDAY, and 1/2 TABLET (3 mg) ALL OTHER DAYS BY MOUTH EVERY DAY OR AS DIRECTED BY HEART FAILURE CLINIC) 30 tablet 6   aspirin EC 81 MG tablet Take 1 tablet (81 mg total) by mouth daily. (Patient not taking: Reported on 12/29/2021) 90 tablet 3   No current facility-administered medications for this encounter.      Vital Signs:  Temp:  Doppler Pressure: 100 Automatc BP: 91/62 (76) HR: 81 SPO2: 97% RA   Weight: 188.2 lbs w/ eqt Last weight: 187.2 lbs Home weights: 177 - 180 lbs   Weight  Wt Readings from Last 3 Encounters:  02/12/22 86.2 kg (190 lb)  01/26/22 85.3 kg (188 lb)  01/12/22 85.4 kg (188 lb 3.2 oz)   Physical Exam: General:  NAD.  HEENT: normal  Neck: supple. JVP 8 with prominent v waves Carotids 2+ bilat; no  bruits. No lymphadenopathy or thryomegaly appreciated. Cor: LVAD hum.  Lungs: Clear. Abdomen: obese soft, nontender, non-distended. No hepatosplenomegaly. No bruits or masses. Good bowel sounds. Driveline site clean. Anchor in place.  Extremities: no cyanosis, clubbing, rash. Warm no edema  Neuro: alert & oriented x 3. No focal deficits. Moves all 4 without problem    ASSESSMENT & PLAN:   1. Esophageal stricture - symptomatically improved with cutting food up - has seen GI. Discussed EGD but he wants to defer for now as symptoms improved - reminded him to be vigilant about cutting/chewing his food well   2. VT/VF c/b CHB - much improved with CRT-D with LBBB lead 5/23 - no further VT - Continue amio at 200 daily - keep K > 4.0. Mg > 2.0 - labs today  3. Chronic systolic HF: Ischemic cardiomyopathy, EF 20% s/p HMII LVAD 09/20/13.   - Improved. Back to NYHA II - MAPs ok.  - Seems to be having some RV failure with increased need for lasix and prominent v-waves on CVP exam. Continue to follow  4. CAD s/p Anterior STEMI on 06/12/14 with stenting of LAD:  - No s/s ischemia - Off ASA.  - Previously stopped statin but now back on crestor 3x/week. No change  5. DL infection, chronic - Site looks good - His wife is changing the dressing every 3 days.  - Remains on suppressive Keflex - no change  6. VAD management:  - Had admit in 08/2017 for elevated LDH.  - LDH stabilized 300-380 range.  - LDH 198 - Off asa with recent epistaxis. . On coumadin.  - Will try to keep INR 2.0-2.5 with HM-2 INR pending Discussed dosing with PharmD personally. - Continue Keflex as above for DL - VAD interrogated personally. Parameters stable.  7. Ankylosing spondylitis: Off NSAIDS. -  No longer following with rheumatology. Uses tramadol. Avoids NSAIDs as much as possible. -  No  change  8. HTN:  - MAPs ok  9.  Kidney Stones  - CT with renal calculi bilaterally. Had some previous hematuria. None  recently  Now followed by Dr Tresa Moore at [facility].  Total time spent 35 minutes. Over half that time spent discussing above.    [name], MD  02/12/2022

## 2022-02-12 NOTE — Patient Instructions (Addendum)
No medication changes  Coumadin dosing per [name] PharmD Return to clinic in 2 month for follow up with Dr Haroldine Laws Return to clinic in 2 weeks for INR/LDH

## 2022-02-12 NOTE — Progress Notes (Signed)
Per Dr Haroldine Laws and Ander Purpura PharmD pt will need 3 doses of IV Feraheme per anemia panel results for today. Orders placed for IV Feraheme. First dose scheduled 02/19/22 at 0900.  Called and spoke with [name] about the above. Provided with infusion clinic phone # [phone] if they need to reschedule appt. She verbalized understanding to all the above.   Emerson Monte RN [place] Coordinator  Office: [phone]  24/7 Pager: [phone]

## 2022-02-18 ENCOUNTER — Other Ambulatory Visit: Payer: Self-pay | Admitting: Nurse Practitioner

## 2022-02-19 ENCOUNTER — Ambulatory Visit (HOSPITAL_COMMUNITY)
Admission: RE | Admit: 2022-02-19 | Discharge: 2022-02-19 | Disposition: A | Discharge status: Home / Self Care | Payer: Medicare Other | Source: Ambulatory Visit | Attending: Internal Medicine | Admitting: Internal Medicine

## 2022-02-19 DIAGNOSIS — E611 Iron deficiency: Secondary | ICD-10-CM | POA: Insufficient documentation

## 2022-02-19 DIAGNOSIS — Z95811 Presence of heart assist device: Secondary | ICD-10-CM | POA: Diagnosis not present

## 2022-02-19 MED ORDER — SODIUM CHLORIDE 0.9 % IV SOLN
510.0000 mg | INTRAVENOUS | Status: DC
  Administered 2022-02-19: 510 mg via INTRAVENOUS
  Filled 2022-02-19: qty 510

## 2022-02-23 ENCOUNTER — Other Ambulatory Visit (HOSPITAL_COMMUNITY): Payer: Self-pay | Admitting: Unknown Physician Specialty

## 2022-02-23 ENCOUNTER — Encounter: Payer: Self-pay | Admitting: Cardiology

## 2022-02-23 ENCOUNTER — Ambulatory Visit (INDEPENDENT_AMBULATORY_CARE_PROVIDER_SITE_OTHER): Payer: Medicare Other | Admitting: Cardiology

## 2022-02-23 DIAGNOSIS — Z7901 Long term (current) use of anticoagulants: Secondary | ICD-10-CM

## 2022-02-23 DIAGNOSIS — Z95811 Presence of heart assist device: Secondary | ICD-10-CM

## 2022-02-23 DIAGNOSIS — I509 Heart failure, unspecified: Secondary | ICD-10-CM | POA: Diagnosis not present

## 2022-02-23 DIAGNOSIS — I472 Ventricular tachycardia, unspecified: Secondary | ICD-10-CM | POA: Diagnosis not present

## 2022-02-23 DIAGNOSIS — I2510 Atherosclerotic heart disease of native coronary artery without angina pectoris: Secondary | ICD-10-CM | POA: Diagnosis not present

## 2022-02-23 MED ORDER — AMIODARONE HCL 200 MG PO TABS: 100.0000 mg | ORAL_TABLET | Freq: Every day | ORAL | 3 refills | Status: DC

## 2022-02-23 NOTE — Patient Instructions (Addendum)
Medication Instructions:  Your physician has recommended you make the following change in your medication:  DECREASE Amiodarone to 100 mg once daily  *If you need a refill on your cardiac medications before your next appointment, please call your pharmacy*   Lab Work: None ordered If you have labs (blood work) drawn today and your tests are completely normal, you will receive your results only by: [place] (if you have MyChart) OR A paper copy in the mail If you have any lab test that is abnormal or we need to change your treatment, we will call you to review the results.   Testing/Procedures: None ordered   Follow-Up: At [facility], you and your health needs are our priority.  As part of our continuing mission to provide you with exceptional heart care, we have created designated Provider Care Teams.  These Care Teams include your primary Cardiologist (physician) and Advanced Practice Providers (APPs -  Physician Assistants and Nurse Practitioners) who all work together to provide you with the care you need, when you need it.  We recommend signing up for the patient portal called "MyChart".  Sign up information is provided on this After Visit Summary.  MyChart is used to connect with patients for Virtual Visits (Telemedicine).  Patients are able to view lab/test results, encounter notes, upcoming appointments, etc.  Non-urgent messages can be sent to your provider as well.   To learn more about what you can do with MyChart, go to [email or web address].    Remote monitoring is used to monitor your Pacemaker or ICD from home. This monitoring reduces the number of office visits required to check your device to one time per year. It allows [place] to keep an eye on the functioning of your device to ensure it is working properly. You are scheduled for a device check from home on 03/02/2022. You may send your transmission at any time that day. If you have a wireless device, the  transmission will be sent automatically. After your physician reviews your transmission, you will receive a postcard with your next transmission date.  Your next appointment:   1 year(s)  The format for your next appointment:   In Person  Provider:   Allegra Lai, MD    Thank you for choosing [facility]!!   Trinidad Curet, RN [phone]    Other Instructions   Important Information About Sugar

## 2022-02-23 NOTE — Progress Notes (Signed)
Electrophysiology Office Note   Date:  02/23/2022   ID:  Scott [name], DOB 26-Feb-1946, MRN [id number]  PCP:  [name], MD  Cardiologist:  [name] Primary Electrophysiologist:  [name], MD    Chief Complaint: CHF   History of Present Illness: Scott [name] is a 76 y.o. male who is being seen today for the evaluation of CHF at the request of [name], MD. Presenting today for electrophysiology evaluation.  He has a history significant coronary artery disease, ankylosing spondylitis, type 2 diabetes, carotid stenting, ischemic cardiomyopathy post VT arrest, PE, pulmonary infarction post IVC filter, chronic systolic heart failure.  He is post HeartMate 2 LVAD 09/20/2013.  He was admitted to the hospital May 2023 with VT/VF and shock.  He underwent emergent cardioversion.  He required inotropic support.  He was readmitted to the hospital with recurrent VT.  He had cardioversion x1 complicated by complete heart block and hypotension.  He developed more episodes of VF while in the hospital.  He is now status post Medtronic CRT-D implanted 11/14/2021.  Today, he denies symptoms of palpitations, chest pain, shortness of breath, orthopnea, PND, lower extremity edema, claudication, dizziness, presyncope, syncope, bleeding, or neurologic sequela. The patient is tolerating medications without difficulties.    Past Medical History:  Diagnosis Date   Acute on chronic respiratory failure ([place])    a. 08/2014 in setting of PE.   Ankylosing spondylitis ([place])    Bilateral pulmonary embolism ([place])    a. 08/2014 - started on Coumadin. Retrievable IVC filter placed 08/27/14 due to RV strain and large clot burden.   CAD (coronary artery disease)    a. stenting of LCx 2013; b. STEMI 06/12/14 s/p PCI to LAD complicated by post cath shock requiring IABP; VT s/p DCCV, EF 20%; c. NSTEMI 06/26/14 treated medically.   Carotid artery disease ([place])    a. s/p stenting.    Chronic systolic CHF (congestive heart failure) ([place])    Diabetes ([place])    Hemoptysis    a. 08/2014 possibly due to pulm infarct/PE.   Hyperlipidemia    Hypertension    Hypotension    Ischemic cardiomyopathy    a. echo 08/23/2014 EF <20%, dilated CM, mod MR/TR   Leukocytosis    Leukocytosis 09/10/2014   Nephrolithiasis    Pleural effusion on right 08/2014 - small   Reactive thrombocytosis 09/10/2014   Right leg DVT ([place])    a. 08/2014.   Ventricular tachycardia ([place])    a. 06/2014 at time of MI, s/p DCCV.   Past Surgical History:  Procedure Laterality Date   BEDSIDE EVACUATION OF HEMATOMA  10/05/2014   Procedure: EVACUATION OF HEMATOMA;  Surgeon: Ivin Poot, MD;  Location: [facility];  Service: Open Heart Surgery;;   CHOLECYSTECTOMY N/A 02/21/2017   Procedure: LAPAROSCOPIC CHOLECYSTECTOMY;  Surgeon: Rolm Bookbinder, MD;  Location: [facility];  Service: General;  Laterality: N/A;   ICD IMPLANT N/A 11/14/2021   Procedure: ICD IMPLANT;  Surgeon: Constance Haw, MD;  Location: [facility] CV LAB;  Service: Cardiovascular;  Laterality: N/A;   INSERTION OF IMPLANTABLE LEFT VENTRICULAR ASSIST DEVICE N/A 09/21/2014   Procedure: INSERTION OF IMPLANTABLE LEFT VENTRICULAR ASSIST DEVICE;  Surgeon: Ivin Poot, MD;  Location: [facility];  Service: Open Heart Surgery;  Laterality: N/A;  CIRC ARREST  NITRIC OXIDE   LEFT HEART CATHETERIZATION WITH CORONARY ANGIOGRAM N/A 06/12/2014   Procedure: LEFT HEART CATHETERIZATION WITH CORONARY ANGIOGRAM;  Surgeon: Lorretta Harp,  MD;  Location: [facility] CATH LAB;  Service: Cardiovascular;  Laterality: N/A;   RIGHT HEART CATHETERIZATION N/A 09/10/2014   Procedure: RIGHT HEART CATH;  Surgeon: [name], MD;  Location: [facility] CATH LAB;  Service: Cardiovascular;  Laterality: N/A;   RIGHT HEART CATHETERIZATION N/A 09/17/2014   Procedure: RIGHT HEART CATH;  Surgeon: [name], MD;  Location: [facility] CATH LAB;  Service: Cardiovascular;  Laterality: N/A;   TEE WITHOUT  CARDIOVERSION N/A 09/21/2014   Procedure: TRANSESOPHAGEAL ECHOCARDIOGRAM (TEE);  Surgeon: Ivin Poot, MD;  Location: [facility];  Service: Open Heart Surgery;  Laterality: N/A;   TEE WITHOUT CARDIOVERSION N/A 10/05/2014   Procedure: TRANSESOPHAGEAL ECHOCARDIOGRAM (TEE);  Surgeon: Ivin Poot, MD;  Location: [facility];  Service: Open Heart Surgery;  Laterality: N/A;   VIDEO ASSISTED THORACOSCOPY (VATS)/THOROCOTOMY Left 10/06/2014   Procedure: VIDEO ASSISTED THORACOSCOPY (VATS)/THOROCOTOMY;  Surgeon: Ivin Poot, MD;  Location: [facility];  Service: Open Heart Surgery;  Laterality: Left;     Current Outpatient Medications  Medication Sig Dispense Refill   acetaminophen (TYLENOL) 500 MG tablet Take 1,000 mg by mouth every 6 (six) hours as needed for moderate pain.      amiodarone (PACERONE) 200 MG tablet Take 1 tablet (200 mg total) by mouth daily. 120 tablet 3   cephALEXin (KEFLEX) 500 MG capsule Take 1 capsule (500 mg total) by mouth 2 (two) times daily. 270 capsule 3   clobetasol ointment (TEMOVATE) 8.83 % Apply 1 application topically 2 (two) times daily. (Patient taking differently: Apply 1 application  topically 2 (two) times daily. As needed) 30 g 2   clopidogrel (PLAVIX) 75 MG tablet Take 1 tablet (75 mg total) by mouth daily. 90 tablet 3   cyclobenzaprine (FLEXERIL) 5 MG tablet Take 1 tablet (5 mg total) by mouth 3 (three) times daily as needed for muscle spasms. 90 tablet 5   furosemide (LASIX) 20 MG tablet Take 2 tablets (40 mg total) by mouth daily. Take 40 mg daily 120 tablet 3   pantoprazole (PROTONIX) 40 MG tablet TAKE 1 TABLET (40 MG TOTAL) BY MOUTH TWICE A DAY BEFORE MEALS 60 tablet 3   potassium chloride SA (KLOR-CON) 20 MEQ tablet Every other day alternate 20 mEq (1 tablet) with 40 mEq (2 tablets) (Patient taking differently: 40 mEq See admin instructions. 20 mEq daily WITH furosemide (take 40 mEq if taking 40 mg of furosemide)) 135 tablet 3   rosuvastatin (CRESTOR) 10 MG tablet Take 10  mg by mouth every Monday, Wednesday, and Friday.     traMADol (ULTRAM) 50 MG tablet Take 2 tablets (100 mg total) by mouth every 6 (six) hours as needed for moderate pain. (Patient taking differently: Take 50 mg by mouth daily as needed for moderate pain.) 120 tablet 3   warfarin (COUMADIN) 6 MG tablet TAKE 1 TABLET BY MOUTH EVERY DAY (Patient taking differently: Take 6 mg by mouth daily at 4 PM. TAKE 1 TABLET (6 mg) on FRIDAY, and 1/2 TABLET (3 mg) ALL OTHER DAYS BY MOUTH EVERY DAY OR AS DIRECTED BY HEART FAILURE CLINIC) 30 tablet 6   aspirin EC 81 MG tablet Take 1 tablet (81 mg total) by mouth daily. (Patient not taking: Reported on 12/29/2021) 90 tablet 3   No current facility-administered medications for this visit.    Allergies:   Biopatch [chlorhexidine], Desyrel [trazodone], and Other   Social History:  The patient  reports that he quit smoking about 52 years ago. His smoking use included cigarettes.  He smoked an average of 0.30 packs per day. He has never used smokeless tobacco. He reports that he does not drink alcohol and does not use drugs.   Family History:  The patient's family history includes Heart Problems in his sister; Heart attack in his father; Hypertension in his father.    ROS:  Please see the history of present illness.   Otherwise, review of systems is positive for none.   All other systems are reviewed and negative.    PHYSICAL EXAM: VS:  Pulse 66   Ht '5\' 10"'$  (1.778 m)   Wt 184 lb (83.5 kg)   SpO2 99%   BMI 26.40 kg/m  , BMI Body mass index is 26.4 kg/m. GEN: Well nourished, well developed, in no acute distress  HEENT: normal  Neck: no JVD, carotid bruits, or masses Cardiac: LVAD hum, RRR; no murmurs, rubs, or gallops,no edema  Respiratory:  clear to auscultation bilaterally, normal work of breathing GI: soft, nontender, nondistended, + BS MS: no deformity or atrophy  Skin: warm and dry, device pocket is well healed Neuro:  Strength and sensation are  intact Psych: euthymic mood, full affect  EKG:  EKG is ordered today. Personal review of the ekg ordered shows AV paced  Device interrogation is reviewed today in detail.  See PaceArt for details.   Recent Labs: 12/21/2021: TSH 7.800 12/29/2021: ALT 24; Magnesium 2.4 02/12/2022: BUN 18; Creatinine, Ser 1.24; Hemoglobin 11.3; Platelets 347; Potassium 4.9; Sodium 139    Lipid Panel     Component Value Date/Time   CHOL 210 (H) 03/15/2021 1020   CHOL 156 03/23/2013 0302   TRIG 43 03/15/2021 1020   TRIG 114 03/23/2013 0302   HDL 41 03/15/2021 1020   HDL 40 03/23/2013 0302   CHOLHDL 5.1 03/15/2021 1020   VLDL 9 03/15/2021 1020   VLDL 23 03/23/2013 0302   LDLCALC 160 (H) 03/15/2021 1020   LDLCALC 93 03/23/2013 0302     Wt Readings from Last 3 Encounters:  02/23/22 184 lb (83.5 kg)  02/12/22 190 lb (86.2 kg)  01/26/22 188 lb (85.3 kg)      Other studies Reviewed: Additional studies/ records that were reviewed today include: TTE 10/31/21  Review of the above records today demonstrates:   1. Left ventricular ejection fraction, by estimation, is <20%. The left  ventricle has severely decreased function. The left ventricle demonstrates  global hypokinesis. The left ventricular internal cavity size was severely  dilated. Left ventricular  diastolic parameters are indeterminate.   2. S/p HMII LVAD. Inflow cannula now well visualized   3. Right ventricular systolic function is severely reduced. The right  ventricular size is moderately enlarged.   4. Left atrial size was moderately dilated.   5. Right atrial size was severely dilated.   6. The mitral valve is normal in structure. Mild mitral valve  regurgitation.   7. The aortic valve was not well visualized. Aortic valve regurgitation  is mild to moderate. No AV opening seen   8. Aortic dilatation noted. There is dilatation of the ascending aorta,  measuring 43 mm.    ASSESSMENT AND PLAN:  1.  VT/VF with complete heart  block: Status post Medtronic CRT-D with left bundle lead implanted 11/14/2021.  Device functioning appropriately.  Currently on amiodarone 200 mg daily.  High risk medication monitoring for amiodarone.  No further ventricular arrhythmias.  As he has had no further ventricular arrhythmias and has a poor taste in his mouth that he attributes  to amiodarone, Krystal Teachey reduce to 100 mg daily.  His bilirubin was elevated at his prior check.  [facility] [place] recheck at next labs on Wednesday.  2.  Chronic systolic heart failure: Due to ischemic cardiomyopathy.  Status post HeartMate 2 LVAD.  Plan per heart failure cardiology.  3.  Coronary artery disease: Status post anterior STEMI in 2015.  Post LAD stent.  No signs of ischemia.    Current medicines are reviewed at length with the patient today.   The patient does not have concerns regarding his medicines.  The following changes were made today:  none  Labs/ tests ordered today include:  Orders Placed This Encounter  Procedures   EKG 12-Lead     Disposition:   FU  9 months  Signed, Suhayb Anzalone Meredith Leeds, MD  02/23/2022 2:53 PM     [facility] [address] [place] [address] [phone] (office) [phone] (fax)

## 2022-02-23 NOTE — Addendum Note (Signed)
Addended by: Stanton Kidney on: 02/23/2022 03:01 PM   Modules accepted: Orders

## 2022-02-25 ENCOUNTER — Other Ambulatory Visit (HOSPITAL_COMMUNITY): Payer: Self-pay | Admitting: Internal Medicine

## 2022-02-25 DIAGNOSIS — Z95811 Presence of heart assist device: Secondary | ICD-10-CM

## 2022-02-26 ENCOUNTER — Encounter (HOSPITAL_COMMUNITY)
Admission: RE | Admit: 2022-02-26 | Discharge: 2022-02-26 | Disposition: A | Discharge status: Home / Self Care | Payer: Medicare Other | Source: Ambulatory Visit | Attending: Internal Medicine | Admitting: Internal Medicine

## 2022-02-26 DIAGNOSIS — Z95811 Presence of heart assist device: Secondary | ICD-10-CM | POA: Diagnosis not present

## 2022-02-26 DIAGNOSIS — E611 Iron deficiency: Secondary | ICD-10-CM | POA: Diagnosis not present

## 2022-02-26 MED ORDER — SODIUM CHLORIDE 0.9 % IV SOLN
510.0000 mg | INTRAVENOUS | Status: DC
  Administered 2022-02-26: 510 mg via INTRAVENOUS
  Filled 2022-02-26: qty 510

## 2022-02-27 ENCOUNTER — Other Ambulatory Visit (HOSPITAL_COMMUNITY): Payer: Self-pay | Admitting: *Deleted

## 2022-02-27 DIAGNOSIS — Z7901 Long term (current) use of anticoagulants: Secondary | ICD-10-CM

## 2022-02-27 DIAGNOSIS — Z95811 Presence of heart assist device: Secondary | ICD-10-CM

## 2022-02-27 LAB — CUP PACEART INCLINIC DEVICE CHECK
Battery Remaining Longevity: 81 mo
Battery Voltage: 3.07 V
Brady Statistic AP VP Percent: 98.13 %
Brady Statistic AP VS Percent: 0.08 %
Brady Statistic AS VP Percent: 1.77 %
Brady Statistic AS VS Percent: 0.02 %
Brady Statistic RA Percent Paced: 98.01 %
Brady Statistic RV Percent Paced: 99.68 %
Date Time Interrogation Session: 20230825142100
HighPow Impedance: 65 Ohm
Implantable Lead Implant Date: 20230516
Implantable Lead Implant Date: 20230516
Implantable Lead Implant Date: 20230516
Implantable Lead Location: 753859
Implantable Lead Location: 753860
Implantable Lead Location: 753860
Implantable Lead Model: 3830
Implantable Lead Model: 5076
Implantable Pulse Generator Implant Date: 20230516
Lead Channel Impedance Value: 285 Ohm
Lead Channel Impedance Value: 285 Ohm
Lead Channel Impedance Value: 342 Ohm
Lead Channel Impedance Value: 437 Ohm
Lead Channel Impedance Value: 456 Ohm
Lead Channel Impedance Value: 551 Ohm
Lead Channel Pacing Threshold Amplitude: 0.75 V
Lead Channel Pacing Threshold Amplitude: 0.75 V
Lead Channel Pacing Threshold Pulse Width: 0.4 ms
Lead Channel Pacing Threshold Pulse Width: 0.4 ms
Lead Channel Sensing Intrinsic Amplitude: 1.25 mV
Lead Channel Sensing Intrinsic Amplitude: 2.25 mV
Lead Channel Sensing Intrinsic Amplitude: 7.625 mV
Lead Channel Setting Pacing Amplitude: 0.5 V
Lead Channel Setting Pacing Amplitude: 2.5 V
Lead Channel Setting Pacing Amplitude: 2.75 V
Lead Channel Setting Pacing Pulse Width: 0.03 ms
Lead Channel Setting Pacing Pulse Width: 0.4 ms
Lead Channel Setting Sensing Sensitivity: 0.3 mV

## 2022-02-27 MED ORDER — ROSUVASTATIN CALCIUM 10 MG PO TABS: 10.0000 mg | ORAL_TABLET | ORAL | 3 refills | Status: DC

## 2022-02-28 ENCOUNTER — Ambulatory Visit: Payer: Medicare Other | Admitting: Gastroenterology

## 2022-02-28 ENCOUNTER — Ambulatory Visit (HOSPITAL_COMMUNITY): Payer: Self-pay | Admitting: Pharmacist

## 2022-02-28 ENCOUNTER — Ambulatory Visit (HOSPITAL_COMMUNITY)
Admission: RE | Admit: 2022-02-28 | Discharge: 2022-02-28 | Disposition: A | Discharge status: Home / Self Care | Payer: Medicare Other | Source: Ambulatory Visit | Attending: Cardiology | Admitting: Cardiology

## 2022-02-28 DIAGNOSIS — Z4801 Encounter for change or removal of surgical wound dressing: Secondary | ICD-10-CM | POA: Insufficient documentation

## 2022-02-28 DIAGNOSIS — Z95811 Presence of heart assist device: Secondary | ICD-10-CM | POA: Diagnosis not present

## 2022-02-28 DIAGNOSIS — Z7901 Long term (current) use of anticoagulants: Secondary | ICD-10-CM | POA: Insufficient documentation

## 2022-02-28 LAB — LACTATE DEHYDROGENASE: LDH: 331 U/L — ABNORMAL HIGH (ref 98–192)

## 2022-02-28 LAB — HEPATIC FUNCTION PANEL
ALT: 23 U/L (ref 0–44)
AST: 42 U/L — ABNORMAL HIGH (ref 15–41)
Albumin: 3.7 g/dL (ref 3.5–5.0)
Alkaline Phosphatase: 127 U/L — ABNORMAL HIGH (ref 38–126)
Bilirubin, Direct: 0.6 mg/dL — ABNORMAL HIGH (ref 0.0–0.2)
Indirect Bilirubin: 0.8 mg/dL (ref 0.3–0.9)
Total Bilirubin: 1.4 mg/dL — ABNORMAL HIGH (ref 0.3–1.2)
Total Protein: 7.4 g/dL (ref 6.5–8.1)

## 2022-02-28 LAB — PROTIME-INR
INR: 2 — ABNORMAL HIGH (ref 0.8–1.2)
Prothrombin Time: 22.6 seconds — ABNORMAL HIGH (ref 11.4–15.2)

## 2022-03-06 ENCOUNTER — Ambulatory Visit (HOSPITAL_COMMUNITY)
Admission: RE | Admit: 2022-03-06 | Discharge: 2022-03-06 | Disposition: A | Discharge status: Home / Self Care | Payer: Medicare Other | Source: Ambulatory Visit | Attending: Internal Medicine | Admitting: Internal Medicine

## 2022-03-06 DIAGNOSIS — E611 Iron deficiency: Secondary | ICD-10-CM | POA: Diagnosis not present

## 2022-03-06 DIAGNOSIS — Z95811 Presence of heart assist device: Secondary | ICD-10-CM | POA: Diagnosis not present

## 2022-03-06 MED ORDER — SODIUM CHLORIDE 0.9 % IV SOLN
510.0000 mg | INTRAVENOUS | Status: DC
  Administered 2022-03-06: 510 mg via INTRAVENOUS
  Filled 2022-03-06: qty 17
  Filled 2022-03-06: qty 510

## 2022-03-15 ENCOUNTER — Other Ambulatory Visit (HOSPITAL_COMMUNITY): Payer: Self-pay | Admitting: Unknown Physician Specialty

## 2022-03-15 DIAGNOSIS — Z7901 Long term (current) use of anticoagulants: Secondary | ICD-10-CM

## 2022-03-15 DIAGNOSIS — Z95811 Presence of heart assist device: Secondary | ICD-10-CM

## 2022-03-21 ENCOUNTER — Ambulatory Visit (HOSPITAL_COMMUNITY)
Admission: RE | Admit: 2022-03-21 | Discharge: 2022-03-21 | Disposition: A | Discharge status: Home / Self Care | Payer: Medicare Other | Source: Ambulatory Visit | Attending: Cardiology | Admitting: Cardiology

## 2022-03-21 ENCOUNTER — Ambulatory Visit (HOSPITAL_COMMUNITY): Payer: Self-pay | Admitting: Pharmacist

## 2022-03-21 DIAGNOSIS — Z7901 Long term (current) use of anticoagulants: Secondary | ICD-10-CM | POA: Insufficient documentation

## 2022-03-21 DIAGNOSIS — Z95811 Presence of heart assist device: Secondary | ICD-10-CM | POA: Insufficient documentation

## 2022-03-21 LAB — PROTIME-INR
INR: 1.8 — ABNORMAL HIGH (ref 0.8–1.2)
Prothrombin Time: 20.6 seconds — ABNORMAL HIGH (ref 11.4–15.2)

## 2022-03-21 LAB — LACTATE DEHYDROGENASE: LDH: 279 U/L — ABNORMAL HIGH (ref 98–192)

## 2022-03-29 ENCOUNTER — Other Ambulatory Visit (HOSPITAL_COMMUNITY): Payer: Self-pay | Admitting: *Deleted

## 2022-03-29 ENCOUNTER — Telehealth: Payer: Self-pay

## 2022-03-29 DIAGNOSIS — Z95811 Presence of heart assist device: Secondary | ICD-10-CM

## 2022-03-29 DIAGNOSIS — Z7901 Long term (current) use of anticoagulants: Secondary | ICD-10-CM

## 2022-03-29 NOTE — Telephone Encounter (Signed)
I helped the patient send missed transmission.

## 2022-03-30 ENCOUNTER — Ambulatory Visit (INDEPENDENT_AMBULATORY_CARE_PROVIDER_SITE_OTHER): Payer: Medicare Other

## 2022-03-30 DIAGNOSIS — I472 Ventricular tachycardia, unspecified: Secondary | ICD-10-CM

## 2022-03-30 LAB — CUP PACEART REMOTE DEVICE CHECK
Battery Remaining Longevity: 80 mo
Battery Voltage: 3.03 V
Brady Statistic AP VP Percent: 98.88 %
Brady Statistic AP VS Percent: 0.05 %
Brady Statistic AS VP Percent: 1.05 %
Brady Statistic AS VS Percent: 0.02 %
Brady Statistic RA Percent Paced: 98.88 %
Brady Statistic RV Percent Paced: 99.86 %
Date Time Interrogation Session: 20230928135400
HighPow Impedance: 68 Ohm
Implantable Lead Implant Date: 20230516
Implantable Lead Implant Date: 20230516
Implantable Lead Implant Date: 20230516
Implantable Lead Location: 753859
Implantable Lead Location: 753860
Implantable Lead Location: 753860
Implantable Lead Model: 3830
Implantable Lead Model: 5076
Implantable Pulse Generator Implant Date: 20230516
Lead Channel Impedance Value: 323 Ohm
Lead Channel Impedance Value: 342 Ohm
Lead Channel Impedance Value: 342 Ohm
Lead Channel Impedance Value: 437 Ohm
Lead Channel Impedance Value: 456 Ohm
Lead Channel Impedance Value: 570 Ohm
Lead Channel Pacing Threshold Amplitude: 0.75 V
Lead Channel Pacing Threshold Amplitude: 0.75 V
Lead Channel Pacing Threshold Pulse Width: 0.4 ms
Lead Channel Pacing Threshold Pulse Width: 0.4 ms
Lead Channel Sensing Intrinsic Amplitude: 1.875 mV
Lead Channel Sensing Intrinsic Amplitude: 1.875 mV
Lead Channel Sensing Intrinsic Amplitude: 7.625 mV
Lead Channel Setting Pacing Amplitude: 0.5 V
Lead Channel Setting Pacing Amplitude: 2.5 V
Lead Channel Setting Pacing Amplitude: 2.75 V
Lead Channel Setting Pacing Pulse Width: 0.03 ms
Lead Channel Setting Pacing Pulse Width: 0.4 ms
Lead Channel Setting Sensing Sensitivity: 0.3 mV

## 2022-04-04 NOTE — Progress Notes (Signed)
Remote ICD transmission.   

## 2022-04-05 ENCOUNTER — Ambulatory Visit (HOSPITAL_COMMUNITY): Payer: Self-pay | Admitting: Pharmacist

## 2022-04-05 ENCOUNTER — Ambulatory Visit (HOSPITAL_COMMUNITY)
Admission: RE | Admit: 2022-04-05 | Discharge: 2022-04-05 | Disposition: A | Discharge status: Home / Self Care | Payer: Medicare Other | Source: Ambulatory Visit | Attending: Internal Medicine | Admitting: Internal Medicine

## 2022-04-05 DIAGNOSIS — Z7901 Long term (current) use of anticoagulants: Secondary | ICD-10-CM | POA: Diagnosis not present

## 2022-04-05 DIAGNOSIS — Z95811 Presence of heart assist device: Secondary | ICD-10-CM | POA: Insufficient documentation

## 2022-04-05 LAB — PROTIME-INR
INR: 2.1 — ABNORMAL HIGH (ref 0.8–1.2)
Prothrombin Time: 23.1 seconds — ABNORMAL HIGH (ref 11.4–15.2)

## 2022-04-05 LAB — LACTATE DEHYDROGENASE: LDH: 272 U/L — ABNORMAL HIGH (ref 98–192)

## 2022-04-12 ENCOUNTER — Other Ambulatory Visit (HOSPITAL_COMMUNITY): Payer: Self-pay

## 2022-04-12 DIAGNOSIS — Z95811 Presence of heart assist device: Secondary | ICD-10-CM

## 2022-04-12 DIAGNOSIS — I509 Heart failure, unspecified: Secondary | ICD-10-CM

## 2022-04-12 DIAGNOSIS — Z7901 Long term (current) use of anticoagulants: Secondary | ICD-10-CM

## 2022-04-16 ENCOUNTER — Encounter (HOSPITAL_COMMUNITY): Payer: Self-pay

## 2022-04-16 ENCOUNTER — Ambulatory Visit (HOSPITAL_COMMUNITY): Payer: Self-pay | Admitting: Pharmacist

## 2022-04-16 ENCOUNTER — Ambulatory Visit (HOSPITAL_COMMUNITY)
Admission: RE | Admit: 2022-04-16 | Discharge: 2022-04-16 | Disposition: A | Discharge status: Home / Self Care | Payer: Medicare Other | Source: Ambulatory Visit | Attending: Internal Medicine | Admitting: Internal Medicine

## 2022-04-16 VITALS — BP 105/94 | HR 88 | Wt 192.0 lb

## 2022-04-16 DIAGNOSIS — E119 Type 2 diabetes mellitus without complications: Secondary | ICD-10-CM | POA: Diagnosis not present

## 2022-04-16 DIAGNOSIS — I5022 Chronic systolic (congestive) heart failure: Secondary | ICD-10-CM | POA: Diagnosis not present

## 2022-04-16 DIAGNOSIS — I5023 Acute on chronic systolic (congestive) heart failure: Secondary | ICD-10-CM

## 2022-04-16 DIAGNOSIS — Z86711 Personal history of pulmonary embolism: Secondary | ICD-10-CM | POA: Diagnosis not present

## 2022-04-16 DIAGNOSIS — Z79899 Other long term (current) drug therapy: Secondary | ICD-10-CM | POA: Diagnosis not present

## 2022-04-16 DIAGNOSIS — E8729 Other acidosis: Secondary | ICD-10-CM | POA: Diagnosis not present

## 2022-04-16 DIAGNOSIS — I447 Left bundle-branch block, unspecified: Secondary | ICD-10-CM | POA: Insufficient documentation

## 2022-04-16 DIAGNOSIS — I110 Hypertensive heart disease with heart failure: Secondary | ICD-10-CM | POA: Insufficient documentation

## 2022-04-16 DIAGNOSIS — Z95811 Presence of heart assist device: Secondary | ICD-10-CM | POA: Insufficient documentation

## 2022-04-16 DIAGNOSIS — I252 Old myocardial infarction: Secondary | ICD-10-CM | POA: Insufficient documentation

## 2022-04-16 DIAGNOSIS — Z7901 Long term (current) use of anticoagulants: Secondary | ICD-10-CM | POA: Insufficient documentation

## 2022-04-16 DIAGNOSIS — R0609 Other forms of dyspnea: Secondary | ICD-10-CM

## 2022-04-16 DIAGNOSIS — I472 Ventricular tachycardia, unspecified: Secondary | ICD-10-CM

## 2022-04-16 DIAGNOSIS — Z955 Presence of coronary angioplasty implant and graft: Secondary | ICD-10-CM | POA: Insufficient documentation

## 2022-04-16 DIAGNOSIS — N200 Calculus of kidney: Secondary | ICD-10-CM | POA: Diagnosis not present

## 2022-04-16 DIAGNOSIS — Z8674 Personal history of sudden cardiac arrest: Secondary | ICD-10-CM | POA: Diagnosis not present

## 2022-04-16 DIAGNOSIS — I5041 Acute combined systolic (congestive) and diastolic (congestive) heart failure: Secondary | ICD-10-CM | POA: Diagnosis not present

## 2022-04-16 DIAGNOSIS — I480 Paroxysmal atrial fibrillation: Secondary | ICD-10-CM

## 2022-04-16 DIAGNOSIS — I509 Heart failure, unspecified: Secondary | ICD-10-CM | POA: Diagnosis not present

## 2022-04-16 DIAGNOSIS — I255 Ischemic cardiomyopathy: Secondary | ICD-10-CM | POA: Diagnosis not present

## 2022-04-16 DIAGNOSIS — R7402 Elevation of levels of lactic acid dehydrogenase (LDH): Secondary | ICD-10-CM | POA: Diagnosis not present

## 2022-04-16 DIAGNOSIS — M459 Ankylosing spondylitis of unspecified sites in spine: Secondary | ICD-10-CM | POA: Diagnosis not present

## 2022-04-16 DIAGNOSIS — I2510 Atherosclerotic heart disease of native coronary artery without angina pectoris: Secondary | ICD-10-CM | POA: Insufficient documentation

## 2022-04-16 DIAGNOSIS — G479 Sleep disorder, unspecified: Secondary | ICD-10-CM | POA: Diagnosis not present

## 2022-04-16 DIAGNOSIS — K222 Esophageal obstruction: Secondary | ICD-10-CM | POA: Insufficient documentation

## 2022-04-16 LAB — PROTIME-INR
INR: 2.2 — ABNORMAL HIGH (ref 0.8–1.2)
Prothrombin Time: 24.3 seconds — ABNORMAL HIGH (ref 11.4–15.2)

## 2022-04-16 LAB — COMPREHENSIVE METABOLIC PANEL
ALT: 19 U/L (ref 0–44)
AST: 34 U/L (ref 15–41)
Albumin: 4.3 g/dL (ref 3.5–5.0)
Alkaline Phosphatase: 142 U/L — ABNORMAL HIGH (ref 38–126)
Anion gap: 9 (ref 5–15)
BUN: 17 mg/dL (ref 8–23)
CO2: 30 mmol/L (ref 22–32)
Calcium: 10.1 mg/dL (ref 8.9–10.3)
Chloride: 104 mmol/L (ref 98–111)
Creatinine, Ser: 1.22 mg/dL (ref 0.61–1.24)
GFR, Estimated: >60 mL/min (ref >60)
Glucose, Bld: 110 mg/dL — ABNORMAL HIGH (ref 70–99)
Potassium: 4.8 mmol/L (ref 3.5–5.1)
Sodium: 143 mmol/L (ref 135–145)
Total Bilirubin: 1.2 mg/dL (ref 0.3–1.2)
Total Protein: 8.4 g/dL — ABNORMAL HIGH (ref 6.5–8.1)

## 2022-04-16 LAB — CBC
HCT: 48.4 % (ref 39.0–52.0)
Hemoglobin: 15.1 g/dL (ref 13.0–17.0)
MCH: 29.8 pg (ref 26.0–34.0)
MCHC: 31.2 g/dL (ref 30.0–36.0)
MCV: 95.5 fL (ref 80.0–100.0)
Platelets: 226 10*3/uL (ref 150–400)
RBC: 5.07 MIL/uL (ref 4.22–5.81)
RDW: 25.3 % — ABNORMAL HIGH (ref 11.5–15.5)
WBC: 6.3 10*3/uL (ref 4.0–10.5)
nRBC: 0 % (ref 0.0–0.2)

## 2022-04-16 LAB — T4, FREE: Free T4: 1.03 ng/dL (ref 0.61–1.12)

## 2022-04-16 LAB — PREALBUMIN: Prealbumin: 18 mg/dL (ref 18–38)

## 2022-04-16 LAB — LACTATE DEHYDROGENASE: LDH: 290 U/L — ABNORMAL HIGH (ref 98–192)

## 2022-04-16 LAB — TSH: TSH: 7.056 u[IU]/mL — ABNORMAL HIGH (ref 0.350–4.500)

## 2022-04-16 MED ORDER — POTASSIUM CHLORIDE CRYS ER 20 MEQ PO TBCR: 40.0000 meq | EXTENDED_RELEASE_TABLET | Freq: Every day | ORAL | 0 refills | Status: DC

## 2022-04-16 NOTE — Patient Instructions (Addendum)
Resume taking ASA 81 mg daily Coumadin dosing per [name] PharmD Return to clinic in 2 months

## 2022-04-16 NOTE — Progress Notes (Addendum)
Patient presents for 2 month follow up with 7.5 yr Intermacs in [facility] today with wife. Reports no problems with VAD equipment or drive line.   Patient arrived ambulatory, no assistance required. He says he is feeling better. He denies lightheadedness, dizziness, syncope, palpitations, ICD shocks, falls, and signs of bleeding. Reports baseline shortness of breath.  Notes appetite is at baseline, and esophageal stricture symptoms improved. His only complaint is he is tired most of the time. He restarted Trazodone 50 mg QHS for insomnia recently.   Per Dr Haroldine Laws will restart ASA 81 mg daily since pt has not had re-current nosebleeds. Pt and wife verbalized understanding.   Taking Amiodarone 100 mg daily. No changes today. Patient has f/u with EP in 9 months.  Per [name] pt's drive line has greatly improved and she is changing dressing weekly.   Vital Signs:  Temp:  Doppler Pressure: 92 Automatc BP: 105/94 (100) HR: 88 SPO2: 98% RA   Weight: 192 lbs w/ eqt Last weight: 190 lbs Home weights: 180 - 184 lbs  VAD Indication: Destination Therapy patient choice     VAD interrogation & Equipment Management: Speed: 9200 Flow: 4.1 Power:  5.0 w    PI: 5.9   Alarms: few LV Events: 0-10  Fixed speed 9200 Low speed limit: 8600   Primary controller: Replace back-up battery in 10 months Back up controller: Replace back-up battery in 14 months -- not present  Annual Equipment Maintenance on UBC/PM was performed on 08/30/2021.   I reviewed the LVAD parameters from today and compared the results to the patient's prior recorded data. LVAD interrogation was NEGATIVE for significant power changes, NEGATIVE for clinical alarms and STABLE for PI events/speed drops. No programming changes were made and pump is functioning within specified parameters. Pt is performing daily controller and system monitor self tests along with completing weekly and monthly maintenance for LVAD equipment.   LVAD  equipment check completed and is in good working order. Back-up equipment present.    Exit Site Care: Drive line is being maintained twice weekly or as needed by his wife using daily dressing kits. Dressing dry and intact. Stabilization device present and accurately applied. Pt denies fever or chills. Pt provided with 4 x 4s, saline wipes, adhesive remover pads, and 5 anchors for home use.    Device: [employer]onic BiV - Therapy: on 250 bpm -  Pacing:  DDD80 -  Last check:  11/15/21  BP & Labs:  MAP 92 - Doppler is reflecting modified systolic   Hgb 73.4 - No S/S of bleeding. Specifically denies melena/BRBPR or nosebleeds.    LDH 290 and is within established baseline of 250- 360. Pt denies tea-colored urine. No power elevations noted on interrogation. We will continue to check this with his INR.   7.5 year Intermacs follow up completed including:  Quality of Life, KCCQ-12, and Neurocognitive trail making.   Pt completed 775 feet during 6 minute walk.  Back up controller: 11V backup battery charged during this visit.- not present  Patient Goals: Pt states he wants "to get up every single day."   Bendena     04/16/2022   11:34 AM 09/13/2021   11:49 AM 03/15/2021    1:51 PM  KCCQ-12  1 a. Ability to shower/bathe Moderately limited Moderately limited Quite a bit limited  1 b. Ability to walk 1 block Quite a bit limited Slightly limited Quite a bit limited  1 c. Ability to hurry/jog Extremely limited  Extremely limited Extremely limited  2. Edema feet/ankles/legs Less than once a week Never over the past 2 weeks Never over the past 2 weeks  3. Limited by fatigue All of the time Several times a day Several times a day  4. Limited by dyspnea All of the time All of the time Several times a day  5. Sitting up / on 3+ pillows Less than once a week Never over the past 2 weeks Never over the past 2 weeks  6. Limited enjoyment of life Limited quite a bit Limited  quite a bit Moderately limited  7. Rest of life w/ symptoms Mostly dissatisfied Mostly dissatisfied Mostly dissatisfied  8 a. Participation in hobbies Severely limited Limited quite a bit Limited quite a bit  8 b. Participation in chores Severely limited Severely limited Limited quite a bit  8 c. Visiting family/friends Limited quite a bit Limited quite a bit Moderately limited     Patient Instructions:  Resume taking ASA 81 mg daily Coumadin dosing per [name] PharmD Return to clinic in 2 months  Scott Monte RN [place] Coordinator  Office: [phone]  24/7 Pager: [phone]

## 2022-04-17 LAB — T3, FREE: T3, Free: 2.5 pg/mL (ref 2.0–4.4)

## 2022-04-17 NOTE — Progress Notes (Signed)
Patient ID: BILLY TURVEY, male   DOB: 1945/09/04, 76 y.o.   MRN: [id number]    LVAD CLINIC NOTE  Primary Cardiologist: Dr Haroldine Laws INR : [facility]  [name] is a 76 y/o male with h/o CAD with ankylosing spondylitis, DM2, carotid stenting, severe ischemic CM s/p anterior STEM with VT arrest in 12/15, large PE with pulmonary infarct in 2/15 s/p IVC filter and systolic HF with EF 40%.  He underwent HM II LVAD placement on 09/20/13. He had a prolonged hospital course due to recurrent chest bleeding, severe CO2 retention and RV failure.  In 8/18 admitted with acute gallstone pancreatitis. Underwent lap chole 02/21/17.   Admitted 2/18 with elevated LDH. He was started on bivalirudin. LDH went down [phone].   Admitted 10/30/21 with VT/VF with shock. Underwent emergent DC-CV. Required inotrope support with milrinone which was successfully weaned. Diuresed with IV lasix. Had AKI with recovery in renal function prior to discharge.   Readmitted 5/23 with recurrent VT. Given IV fluids. Underwent shock x1 complicated by CHB and hypotension. Developed several more episodes of VF while in hospital requiring DC-CV. Remained in CHB. EP consulted. Had CRT-D ICD placed with LBBB lead. D/c's on po amio  Here for f/u with [name]. Feeling good. Remains active. Driveline site much improved. No further nosebleeds. Denies orthopnea or PND. Weight stable. Takes lasix daily. No fevers, chills or problems with driveline. No bleeding, melena or neuro symptoms. No VAD alarms. Taking all meds as prescribed.      VAD Indication: Destination Therapy patient choice     VAD interrogation & Equipment Management: Speed: 9200 Flow: 4.1 Power:  5.0 w    PI: 5.9   Alarms: few LV Events: 0-10  Fixed speed 9200 Low speed limit: 8600   Primary controller: Replace back-up battery in 10 months Back up controller: Replace back-up battery in 14 months -- not present   Annual Equipment Maintenance on UBC/PM  was performed on 08/30/2021.   I reviewed the LVAD parameters from today and compared the results to the patient's prior recorded data. LVAD interrogation was NEGATIVE for significant power changes, NEGATIVE for clinical alarms and STABLE for PI events/speed drops. No programming changes were made and pump is functioning within specified parameters. Pt is performing daily controller and system monitor self tests along with completing weekly and monthly maintenance for LVAD equipment.   LVAD equipment check completed and is in good working order. Back-up equipment present.    Exit Site Care: Drive line is being maintained twice weekly or as needed by his wife using daily dressing kits. Dressing dry and intact. Stabilization device present and accurately applied. Pt denies fever or chills. Pt provided with 4 x 4s, saline wipes, adhesive remover pads, and 5 anchors for home use.      SH:  Social History   Socioeconomic History   Marital status: Married    Spouse name: Not on file   Number of children: 0   Years of education: Not on file   Highest education level: Not on file  Occupational History   Occupation: retired  Tobacco Use   Smoking status: Former    Packs/day: 0.30    Years: 0.00    Total pack years: 0.00    Types: Cigarettes    Quit date: 06/12/1969    Years since quitting: 52.8   Smokeless tobacco: Never   Tobacco comments:    pt smoked while in [employer] 2-3 cig x 6 months.  Vaping Use  Vaping Use: Former  Substance and Sexual Activity   Alcohol use: No    Alcohol/week: 0.0 standard drinks of alcohol   Drug use: No   Sexual activity: Not Currently  Other Topics Concern   Not on file  Social History Narrative   Not on file   Social Determinants of Health   Financial Resource Strain: Not on file  Food Insecurity: Not on file  Transportation Needs: Not on file  Physical Activity: Not on file  Stress: Not on file  Social Connections: Not on file  Intimate  Partner Violence: Not on file    FH:  Family History  Problem Relation Age of Onset   Hypertension Father    Heart attack Father    Heart Problems Sister    Colon cancer Neg Hx    Stomach cancer Neg Hx    Esophageal cancer Neg Hx    Colon polyps Neg Hx     Past Medical History:  Diagnosis Date   Acute on chronic respiratory failure ([place])    a. 08/2014 in setting of PE.   Ankylosing spondylitis ([place])    Bilateral pulmonary embolism ([place])    a. 08/2014 - started on Coumadin. Retrievable IVC filter placed 08/27/14 due to RV strain and large clot burden.   CAD (coronary artery disease)    a. stenting of LCx 2013; b. STEMI 06/12/14 s/p PCI to LAD complicated by post cath shock requiring IABP; VT s/p DCCV, EF 20%; c. NSTEMI 06/26/14 treated medically.   Carotid artery disease ([place])    a. s/p stenting.   Chronic systolic CHF (congestive heart failure) ([place])    Diabetes ([place])    Hemoptysis    a. 08/2014 possibly due to pulm infarct/PE.   Hyperlipidemia    Hypertension    Hypotension    Ischemic cardiomyopathy    a. echo 08/23/2014 EF <20%, dilated CM, mod MR/TR   Leukocytosis    Leukocytosis 09/10/2014   Nephrolithiasis    Pleural effusion on right 08/2014 - small   Reactive thrombocytosis 09/10/2014   Right leg DVT ([place])    a. 08/2014.   Ventricular tachycardia ([place])    a. 06/2014 at time of MI, s/p DCCV.   Current Outpatient Medications  Medication Sig Dispense Refill   acetaminophen (TYLENOL) 500 MG tablet Take 1,000 mg by mouth every 6 (six) hours as needed for moderate pain.      amiodarone (PACERONE) 200 MG tablet Take 0.5 tablets (100 mg total) by mouth daily. 45 tablet 3   cephALEXin (KEFLEX) 500 MG capsule Take 1 capsule (500 mg total) by mouth 2 (two) times daily. 270 capsule 3   clobetasol ointment (TEMOVATE) 3.29 % Apply 1 application topically 2 (two) times daily. (Patient taking differently: Apply 1 application  topically 2 (two) times daily. As needed) 30 g 2    clopidogrel (PLAVIX) 75 MG tablet Take 1 tablet (75 mg total) by mouth daily. 90 tablet 3   cyclobenzaprine (FLEXERIL) 5 MG tablet Take 1 tablet (5 mg total) by mouth 3 (three) times daily as needed for muscle spasms. 90 tablet 5   furosemide (LASIX) 20 MG tablet Take 2 tablets (40 mg total) by mouth daily. Take 40 mg daily 120 tablet 3   pantoprazole (PROTONIX) 40 MG tablet TAKE 1 TABLET (40 MG TOTAL) BY MOUTH TWICE A DAY BEFORE MEALS (Patient taking differently: 40 mg 2 (two) times daily.) 60 tablet 3   rosuvastatin (CRESTOR) 10 MG tablet Take 1 tablet (10 mg  total) by mouth every Monday, Wednesday, and Friday. 40 tablet 3   traMADol (ULTRAM) 50 MG tablet Take 2 tablets (100 mg total) by mouth every 6 (six) hours as needed for moderate pain. (Patient taking differently: Take 50 mg by mouth daily as needed for moderate pain.) 120 tablet 3   traZODone (DESYREL) 50 MG tablet Take 50 mg by mouth at bedtime.     warfarin (COUMADIN) 6 MG tablet TAKE 1 TABLET BY MOUTH EVERY DAY (Patient taking differently: Take 6 mg by mouth daily at 4 PM. TAKE 1 TABLET (6 mg) on FRIDAY, and 1/2 TABLET (3 mg) ALL OTHER DAYS BY MOUTH EVERY DAY OR AS DIRECTED BY HEART FAILURE CLINIC) 30 tablet 6   aspirin EC 81 MG tablet Take 1 tablet (81 mg total) by mouth daily. (Patient not taking: Reported on 12/29/2021) 90 tablet 3   potassium chloride SA (KLOR-CON M) 20 MEQ tablet Take 2 tablets (40 mEq total) by mouth daily. 20 mEq daily WITH furosemide (take 40 mEq if taking 40 mg of furosemide) 180 tablet 0   No current facility-administered medications for this encounter.    Vital Signs:  Temp:  Doppler Pressure: 92 Automatc BP: 105/94 (100) HR: 88 SPO2: 98% RA   Weight: 192 lbs w/ eqt Last weight: 190 lbs Home weights: 180 - 184 lbs   Weight  Wt Readings from Last 3 Encounters:  04/16/22 87.1 kg (192 lb)  03/06/22 83.9 kg (185 lb)  02/23/22 83.5 kg (184 lb)   Physical Exam: General:  NAD.  HEENT: normal  Neck:  supple. JVP not elevated.  Carotids 2+ bilat; no bruits. No lymphadenopathy or thryomegaly appreciated. Cor: LVAD hum.  Lungs: Clear. Abdomen: soft, nontender, non-distended. No hepatosplenomegaly. No bruits or masses. Good bowel sounds. Driveline site clean. Anchor in place.  Extremities: no cyanosis, clubbing, rash. Warm no edema  Neuro: alert & oriented x 3. No focal deficits. Moves all 4 without problem    ASSESSMENT & PLAN:  1. Chronic systolic HF: Ischemic cardiomyopathy, EF 20% s/p HMII LVAD 09/20/13.   - Stable NYHA II - MAPs ok.  - RV failure under good control with daily lasix  2. Esophageal stricture - symptomatically improved with cutting food up - has seen GI. Discussed EGD but he wants to defer for now as symptoms improved - reminded him to be vigilant about cutting/chewing his food well  - No change  3. VT/VF c/b CHB - much improved with CRT-D with LBBB lead 5/23 - no further VT - Continue amio at 200 daily - keep K > 4.0. Mg > 2.0 - labs today  4. CAD s/p Anterior STEMI on 06/12/14 with stenting of LAD:  - No s/s ischemia - Resume ASA as nosebleeds have resolved - Previously stopped statin but now back on crestor 3x/week. No change  5. DL infection, chronic - Site looks much better - Remains on suppressive Keflex - Dressing changes can go back to weekly  6. VAD management:  - Had admit in 08/2017 for elevated LDH.  - LDH stabilized 300-380 range.  - LDH 198 - Off asa with recent epistaxis. . On coumadin.  - Goal INR 2.0-2.5 INR 2.2 today. Discussed dosing with PharmD personally. - Continue Keflex as above for DL - VAD interrogated personally. Parameters stable.  7. Ankylosing spondylitis: Off NSAIDS. -  No longer following with rheumatology. Uses tramadol. Avoids NSAIDs as much as possible. -  No change  8. HTN:  - MAPs ok  9.  Kidney Stones  - CT with renal calculi bilaterally. Had some previous hematuria. None recently  Now followed by Dr Tresa Moore  at [facility].  Total time spent 35 minutes. Over half that time spent discussing above.   [name], MD  04/17/2022

## 2022-05-02 ENCOUNTER — Telehealth (HOSPITAL_COMMUNITY): Payer: Self-pay | Admitting: Unknown Physician Specialty

## 2022-05-02 NOTE — Telephone Encounter (Signed)
Received call from pts wife stating that she has been sick for a couple weeks. She states she finally went to the doctor and was diagnosis with a virus but states she was given antibiotics. Scott [name] tells me that last week Scott [name] started to feel bad as well. She states that he has been coughing and has a lot of upper respiratory secretions. She states that she has been giving him plain robitussin and Tylenol. She tells me that on Sunday/Monday the pt had a severe nose bleed from blowing his nose and so they stopped his ASA. This was restarted at his last visit. Scott [name] states that they do not want to restart the ASA again. She states that Scott [name] has been running low grade fevers. D/w DR Bensimhon, we will not do antibiotics at this time. I have removed ASA from med list. Pt/wife instructed to call back if symptoms do not improve.  [name] RN, BSN VAD Coordinator 24/7 Pager [phone]

## 2022-05-03 ENCOUNTER — Other Ambulatory Visit (HOSPITAL_COMMUNITY): Payer: Self-pay

## 2022-05-03 DIAGNOSIS — Z7901 Long term (current) use of anticoagulants: Secondary | ICD-10-CM

## 2022-05-03 DIAGNOSIS — Z95811 Presence of heart assist device: Secondary | ICD-10-CM

## 2022-05-09 ENCOUNTER — Ambulatory Visit (HOSPITAL_COMMUNITY)
Admission: RE | Admit: 2022-05-09 | Discharge: 2022-05-09 | Disposition: A | Discharge status: Home / Self Care | Payer: Medicare Other | Source: Ambulatory Visit | Attending: Cardiology | Admitting: Cardiology

## 2022-05-09 ENCOUNTER — Ambulatory Visit (HOSPITAL_COMMUNITY): Payer: Self-pay | Admitting: Pharmacist

## 2022-05-09 DIAGNOSIS — Z95811 Presence of heart assist device: Secondary | ICD-10-CM | POA: Diagnosis not present

## 2022-05-09 DIAGNOSIS — Z7901 Long term (current) use of anticoagulants: Secondary | ICD-10-CM | POA: Insufficient documentation

## 2022-05-09 LAB — PROTIME-INR
INR: 3.4 — ABNORMAL HIGH (ref 0.8–1.2)
Prothrombin Time: 34 seconds — ABNORMAL HIGH (ref 11.4–15.2)

## 2022-05-09 LAB — LACTATE DEHYDROGENASE: LDH: 328 U/L — ABNORMAL HIGH (ref 98–192)

## 2022-05-09 NOTE — Progress Notes (Signed)
LVAD INR 

## 2022-05-18 ENCOUNTER — Other Ambulatory Visit (HOSPITAL_COMMUNITY): Payer: Self-pay | Admitting: Unknown Physician Specialty

## 2022-05-18 DIAGNOSIS — Z7901 Long term (current) use of anticoagulants: Secondary | ICD-10-CM

## 2022-05-18 DIAGNOSIS — Z95811 Presence of heart assist device: Secondary | ICD-10-CM

## 2022-05-23 ENCOUNTER — Ambulatory Visit (HOSPITAL_COMMUNITY)
Admission: RE | Admit: 2022-05-23 | Discharge: 2022-05-23 | Disposition: A | Discharge status: Home / Self Care | Payer: Medicare Other | Source: Ambulatory Visit | Attending: Internal Medicine | Admitting: Internal Medicine

## 2022-05-23 ENCOUNTER — Ambulatory Visit (HOSPITAL_COMMUNITY): Payer: Self-pay | Admitting: Pharmacist

## 2022-05-23 DIAGNOSIS — Z95811 Presence of heart assist device: Secondary | ICD-10-CM | POA: Diagnosis not present

## 2022-05-23 DIAGNOSIS — Z7901 Long term (current) use of anticoagulants: Secondary | ICD-10-CM | POA: Insufficient documentation

## 2022-05-23 LAB — PROTIME-INR
INR: 2.6 — ABNORMAL HIGH (ref 0.8–1.2)
Prothrombin Time: 27.8 seconds — ABNORMAL HIGH (ref 11.4–15.2)

## 2022-05-23 LAB — LACTATE DEHYDROGENASE: LDH: 286 U/L — ABNORMAL HIGH (ref 98–192)

## 2022-05-31 ENCOUNTER — Encounter: Payer: Self-pay | Admitting: Emergency Medicine

## 2022-05-31 DIAGNOSIS — I5022 Chronic systolic (congestive) heart failure: Secondary | ICD-10-CM | POA: Diagnosis not present

## 2022-05-31 DIAGNOSIS — I110 Hypertensive heart disease with heart failure: Secondary | ICD-10-CM | POA: Diagnosis not present

## 2022-05-31 DIAGNOSIS — E119 Type 2 diabetes mellitus without complications: Secondary | ICD-10-CM | POA: Diagnosis not present

## 2022-05-31 DIAGNOSIS — R040 Epistaxis: Secondary | ICD-10-CM | POA: Diagnosis not present

## 2022-05-31 DIAGNOSIS — I2510 Atherosclerotic heart disease of native coronary artery without angina pectoris: Secondary | ICD-10-CM | POA: Diagnosis not present

## 2022-05-31 NOTE — ED Triage Notes (Signed)
Pt presents via POV with complaints of nose bleed that started around 2215 after sneezing. Pt on Plavix and Coumadin. PTA the patient tried afrin and a nose clip without improvement. Bleeding controlled with the nasal clip. Denies CP or SOB.

## 2022-06-01 ENCOUNTER — Emergency Department
Admission: EM | Admit: 2022-06-01 | Discharge: 2022-06-01 | Disposition: A | Discharge status: Home / Self Care | Payer: Medicare Other | Attending: Emergency Medicine | Admitting: Emergency Medicine

## 2022-06-01 ENCOUNTER — Telehealth (HOSPITAL_COMMUNITY): Payer: Self-pay

## 2022-06-01 DIAGNOSIS — R040 Epistaxis: Secondary | ICD-10-CM | POA: Diagnosis not present

## 2022-06-01 LAB — CBC WITH DIFFERENTIAL/PLATELET
Abs Immature Granulocytes: 0.03 10*3/uL (ref 0.00–0.07)
Basophils Absolute: 0 10*3/uL (ref 0.0–0.1)
Basophils Relative: 1 %
Eosinophils Absolute: 0.2 10*3/uL (ref 0.0–0.5)
Eosinophils Relative: 2 %
HCT: 47 % (ref 39.0–52.0)
Hemoglobin: 15.2 g/dL (ref 13.0–17.0)
Immature Granulocytes: 0 %
Lymphocytes Relative: 9 %
Lymphs Abs: 0.7 10*3/uL (ref 0.7–4.0)
MCH: 32.1 pg (ref 26.0–34.0)
MCHC: 32.3 g/dL (ref 30.0–36.0)
MCV: 99.2 fL (ref 80.0–100.0)
Monocytes Absolute: 0.9 10*3/uL (ref 0.1–1.0)
Monocytes Relative: 12 %
Neutro Abs: 5.5 10*3/uL (ref 1.7–7.7)
Neutrophils Relative %: 76 %
Platelets: 218 10*3/uL (ref 150–400)
RBC: 4.74 MIL/uL (ref 4.22–5.81)
RDW: 17.9 % — ABNORMAL HIGH (ref 11.5–15.5)
WBC: 7.2 10*3/uL (ref 4.0–10.5)
nRBC: 0 % (ref 0.0–0.2)

## 2022-06-01 LAB — COMPREHENSIVE METABOLIC PANEL
ALT: 18 U/L (ref 0–44)
AST: 36 U/L (ref 15–41)
Albumin: 4.1 g/dL (ref 3.5–5.0)
Alkaline Phosphatase: 151 U/L — ABNORMAL HIGH (ref 38–126)
Anion gap: 7 (ref 5–15)
BUN: 18 mg/dL (ref 8–23)
CO2: 28 mmol/L (ref 22–32)
Calcium: 9.5 mg/dL (ref 8.9–10.3)
Chloride: 103 mmol/L (ref 98–111)
Creatinine, Ser: 1.21 mg/dL (ref 0.61–1.24)
GFR, Estimated: >60 mL/min (ref >60)
Glucose, Bld: 161 mg/dL — ABNORMAL HIGH (ref 70–99)
Potassium: 4.1 mmol/L (ref 3.5–5.1)
Sodium: 138 mmol/L (ref 135–145)
Total Bilirubin: 1.8 mg/dL — ABNORMAL HIGH (ref 0.3–1.2)
Total Protein: 9.3 g/dL — ABNORMAL HIGH (ref 6.5–8.1)

## 2022-06-01 LAB — PROTIME-INR
INR: 2.8 — ABNORMAL HIGH (ref 0.8–1.2)
Prothrombin Time: 28.9 s — ABNORMAL HIGH (ref 11.4–15.2)

## 2022-06-01 MED ORDER — LIDOCAINE VISCOUS HCL 2 % MT SOLN
15.0000 mL | Freq: Once | OROMUCOSAL | Status: AC
  Administered 2022-06-01: 15 mL via OROMUCOSAL
  Filled 2022-06-01: qty 15

## 2022-06-01 NOTE — ED Notes (Addendum)
Wife to desk st, nosebleed restarted; pt sitting in lobby with steady gush of blood noted from left nostril; nose clamp placed; pt has LVAD and is taking coumadin and plavix; pt taken to room 54 via w/c; Dr Starleen Blue notif of pt's arrival

## 2022-06-01 NOTE — Telephone Encounter (Signed)
Received call that pt in the ED overnight for nose bleed. Nose has been packed. Follow up on Tuesday. INR 2.8. No other bleeding identified. Instructred to continue warfarin regimen as previously discussed. Advised to contact VAD pager over the weekend if bleeding continues.

## 2022-06-01 NOTE — ED Provider Notes (Signed)
[facility] Provider Note    Event Date/Time   First MD Initiated Contact with Patient 06/01/22 [phone]     (approximate)   History   Epistaxis   HPI  Scott [name] is a 76 y.o. male  pmh LVAD, CAD, ankylosing spondylitis who presents with epistaxis. Symptoms started this evening after he sneezed.  They initially used Afrin and compression seem to stop but then he read blood.  Has been in the waiting room about 3 hours initially had stopped and then started again.  Thinks it is mainly from the left nare.  Denies other bleeding.  He is on Coumadin.  Goal INR 2-2.5     Past Medical History:  Diagnosis Date   Acute on chronic respiratory failure ([place])    a. 08/2014 in setting of PE.   Ankylosing spondylitis ([place])    Bilateral pulmonary embolism ([place])    a. 08/2014 - started on Coumadin. Retrievable IVC filter placed 08/27/14 due to RV strain and large clot burden.   CAD (coronary artery disease)    a. stenting of LCx 2013; b. STEMI 06/12/14 s/p PCI to LAD complicated by post cath shock requiring IABP; VT s/p DCCV, EF 20%; c. NSTEMI 06/26/14 treated medically.   Carotid artery disease ([place])    a. s/p stenting.   Chronic systolic CHF (congestive heart failure) ([place])    Diabetes ([place])    Hemoptysis    a. 08/2014 possibly due to pulm infarct/PE.   Hyperlipidemia    Hypertension    Hypotension    Ischemic cardiomyopathy    a. echo 08/23/2014 EF <20%, dilated CM, mod MR/TR   Leukocytosis    Leukocytosis 09/10/2014   Nephrolithiasis    Pleural effusion on right 08/2014 - small   Reactive thrombocytosis 09/10/2014   Right leg DVT ([place])    a. 08/2014.   Ventricular tachycardia ([place])    a. 06/2014 at time of MI, s/p DCCV.    Patient Active Problem List   Diagnosis Date Noted   Hyperkalemia 11/10/2021   Supratherapeutic INR 11/10/2021   V-tach ([place]) 10/30/2021   Ventricular tachycardia ([place]) 10/30/2021   Elevated LDH    Fungal rash of trunk 03/17/2015    Right femoral vein DVT ([place]) 01/10/2015   Chronic anticoagulation 12/02/2014   Anxiety state 10/11/2014   Sleep disturbance 10/11/2014   LVAD (left ventricular assist device) present ([place])    PAF (paroxysmal atrial fibrillation) ([place])    CHF (congestive heart failure) ([place]) 09/21/2014   Palliative care encounter 09/16/2014   Weakness generalized 09/16/2014   Encounter for therapeutic drug monitoring 09/08/2014   Cough with hemoptysis    Hypoxia    PE (pulmonary embolism) 08/26/2014   Pulmonary embolus ([place]) 08/26/2014   CAD in native artery 19/37/9024   Chronic systolic heart failure ([place]) 06/22/2014   Abdominal pain, acute    STEMI (ST elevation myocardial infarction) ([place]) 06/12/2014   VT (ventricular tachycardia) ([facility])    ST elevation myocardial infarction involving left anterior descending (LAD) coronary artery [facility])      Physical Exam  Triage Vital Signs: ED Triage Vitals  Enc Vitals Group     BP 05/31/22 2349 103/86     Pulse Rate 05/31/22 2349 80     Resp 05/31/22 2349 20     Temp 05/31/22 2349 97.7 F (36.5 C)     Temp Source 05/31/22 2349 Oral     SpO2 05/31/22 2349 95 %     Weight 05/31/22  2349 185 lb (83.9 kg)     Height 05/31/22 2349 '5\' 10"'$  (1.778 m)     Head Circumference --      Peak Flow --      Pain Score 05/31/22 2354 0     Pain Loc --      Pain Edu? --      Excl. in [place]? --     Most recent vital signs: Vitals:   05/31/22 2349  BP: 103/86  Pulse: 80  Resp: 20  Temp: 97.7 F (36.5 C)  SpO2: 95%     General: Awake, no distress.  CV:  Good peripheral perfusion.  Resp:  Normal effort.  Abd:  No distention.  Neuro:             Awake, Alert, Oriented x 3  Other:  Left nare with significant clot, after clot is expelled there is no active bleeding just some oozing, no clear source of bleeding identified there is some minor bleeding in the right nare that is not active   ED Results / Procedures / Treatments  Labs (all labs ordered are listed,  but only abnormal results are displayed) Labs Reviewed  CBC WITH DIFFERENTIAL/PLATELET - Abnormal; Notable for the following components:      Result Value   RDW 17.9 (*)    All other components within normal limits  COMPREHENSIVE METABOLIC PANEL - Abnormal; Notable for the following components:   Glucose, Bld 161 (*)    Total Protein 9.3 (*)    Alkaline Phosphatase 151 (*)    Total Bilirubin 1.8 (*)    All other components within normal limits  PROTIME-INR - Abnormal; Notable for the following components:   Prothrombin Time 28.9 (*)    INR 2.8 (*)    All other components within normal limits     EKG     RADIOLOGY    PROCEDURES:  Critical Care performed: No  .Epistaxis Management  Date/Time: 06/01/2022 4:56 AM  Performed by: Rada Hay, MD Authorized by: Rada Hay, MD   Consent:    Consent obtained:  Verbal   Risks discussed:  Infection   Alternatives discussed:  Alternative treatment and observation Universal protocol:    Patient identity confirmed:  Verbally with patient Anesthesia:    Anesthesia method:  Topical application   Topical anesthetic:  Lidocaine gel Procedure details:    Treatment site:  L anterior   Treatment method:  Anterior pack   Treatment complexity:  Limited   Treatment episode: recurring   Post-procedure details:    Assessment:  Bleeding decreased   Procedure completion:  Tolerated   The patient is on the cardiac monitor to evaluate for evidence of arrhythmia and/or significant heart rate changes.   MEDICATIONS ORDERED IN ED: Medications  lidocaine (XYLOCAINE) 2 % viscous mouth solution 15 mL (15 mLs Mouth/Throat Given by Other 06/01/22 2585)     IMPRESSION / MDM / ASSESSMENT AND PLAN / ED COURSE  I reviewed the triage vital signs and the nursing notes.                              Patient's presentation is most consistent with acute, uncomplicated illness.  Differential diagnosis includes, but is not limited  to, anterior epistaxis, posterior epistaxis, supratherapeutic INR, coagulopathy  Patient is a 76 year old with an LVAD on Coumadin presents with epistaxis from the left nare.  Has been coming and going this evening.  Does have history of requiring nasal packing.  Denies other bleeding.  Today INR is 2.8 his hemoglobin is stable.  When I entered the room patient has nasal clamp in place.  When clamp was removed there is no ongoing bleeding but there is significant large clot in the left nare.  I had the patient blow out the clot and there was significant clot that returned from both nares actually.  On reexam there is some scant bleeding no obvious anterior source identifiable.  Given patient has tried Afrin and compression at home I did go straight to anterior nasal packing with [employer].  Was observed for about 20 to 30 minutes afterward to ensure no ongoing bleeding.  He is on prophylactic Keflex.  Given there is limited evidence of benefit for antibiotics I did not add additional antibiotics for his nasal packing.  He follows with Dr. Tami Ribas with ENT in the past.  Advised to follow-up with ENT in about 48 hours for removal.       FINAL CLINICAL IMPRESSION(S) / ED DIAGNOSES   Final diagnoses:  Epistaxis     Rx / DC Orders   ED Discharge Orders     None        Note:  This document was prepared using Dragon voice recognition software and may include unintentional dictation errors.   Rada Hay, MD 06/01/22 [phone]

## 2022-06-05 DIAGNOSIS — D65 Disseminated intravascular coagulation [defibrination syndrome]: Secondary | ICD-10-CM | POA: Diagnosis not present

## 2022-06-05 DIAGNOSIS — R040 Epistaxis: Secondary | ICD-10-CM | POA: Diagnosis not present

## 2022-06-06 ENCOUNTER — Ambulatory Visit (HOSPITAL_COMMUNITY): Payer: Self-pay | Admitting: Pharmacist

## 2022-06-06 ENCOUNTER — Ambulatory Visit (HOSPITAL_COMMUNITY)
Admission: RE | Admit: 2022-06-06 | Discharge: 2022-06-06 | Disposition: A | Discharge status: Home / Self Care | Payer: Medicare Other | Source: Ambulatory Visit | Attending: Internal Medicine | Admitting: Internal Medicine

## 2022-06-06 DIAGNOSIS — I5022 Chronic systolic (congestive) heart failure: Secondary | ICD-10-CM

## 2022-06-06 DIAGNOSIS — Z95811 Presence of heart assist device: Secondary | ICD-10-CM

## 2022-06-06 LAB — PROTIME-INR
INR: 2.2 — ABNORMAL HIGH (ref 0.8–1.2)
Prothrombin Time: 24.4 seconds — ABNORMAL HIGH (ref 11.4–15.2)

## 2022-06-07 ENCOUNTER — Other Ambulatory Visit (HOSPITAL_COMMUNITY): Payer: Self-pay

## 2022-06-07 DIAGNOSIS — Z95811 Presence of heart assist device: Secondary | ICD-10-CM

## 2022-06-07 DIAGNOSIS — Z7901 Long term (current) use of anticoagulants: Secondary | ICD-10-CM

## 2022-06-11 ENCOUNTER — Ambulatory Visit (HOSPITAL_COMMUNITY): Payer: Self-pay | Admitting: Pharmacist

## 2022-06-11 ENCOUNTER — Ambulatory Visit (HOSPITAL_COMMUNITY)
Admission: RE | Admit: 2022-06-11 | Discharge: 2022-06-11 | Disposition: A | Discharge status: Home / Self Care | Payer: Medicare Other | Source: Ambulatory Visit | Attending: Cardiology | Admitting: Cardiology

## 2022-06-11 DIAGNOSIS — Z95811 Presence of heart assist device: Secondary | ICD-10-CM | POA: Insufficient documentation

## 2022-06-11 DIAGNOSIS — Z7901 Long term (current) use of anticoagulants: Secondary | ICD-10-CM | POA: Insufficient documentation

## 2022-06-11 DIAGNOSIS — R040 Epistaxis: Secondary | ICD-10-CM | POA: Diagnosis not present

## 2022-06-11 LAB — PROTIME-INR
INR: 1.7 — ABNORMAL HIGH (ref 0.8–1.2)
Prothrombin Time: 19.9 seconds — ABNORMAL HIGH (ref 11.4–15.2)

## 2022-06-13 ENCOUNTER — Telehealth (HOSPITAL_COMMUNITY): Payer: Self-pay | Admitting: Unknown Physician Specialty

## 2022-06-13 ENCOUNTER — Encounter (HOSPITAL_COMMUNITY): Payer: Medicare Other

## 2022-06-13 NOTE — Telephone Encounter (Signed)
Received call from [name] stating that Scott [name] has had another nosebleed that started last night. She tells me that she reached out to the ENT doctor who is reluctant to pack the nose further since packing was just removed on Monday. Pt and wife wanted to hold plavix and amio for a week, D/w Dr Haroldine Laws who was ok with pt and wife holding both for a week but pt and wife ultimately decided that they would continue both meds until pts appt with DB next week on 12/20. [name] states that she plans to give [name] 3 mg of coumadin every night until then. [name] -pharm D aware of plan.  [name] RN, BSN VAD Coordinator 24/7 Pager [phone]

## 2022-06-14 ENCOUNTER — Ambulatory Visit (HOSPITAL_COMMUNITY): Payer: Self-pay | Admitting: Pharmacist

## 2022-06-14 ENCOUNTER — Encounter: Payer: Self-pay | Admitting: Emergency Medicine

## 2022-06-14 ENCOUNTER — Emergency Department
Admission: EM | Admit: 2022-06-14 | Discharge: 2022-06-14 | Disposition: A | Discharge status: Home / Self Care | Payer: Medicare Other | Attending: Emergency Medicine | Admitting: Emergency Medicine

## 2022-06-14 DIAGNOSIS — Z7901 Long term (current) use of anticoagulants: Secondary | ICD-10-CM | POA: Diagnosis not present

## 2022-06-14 DIAGNOSIS — R040 Epistaxis: Secondary | ICD-10-CM | POA: Insufficient documentation

## 2022-06-14 LAB — CBC
HCT: 42.6 % (ref 39.0–52.0)
Hemoglobin: 13.7 g/dL (ref 13.0–17.0)
MCH: 32.2 pg (ref 26.0–34.0)
MCHC: 32.2 g/dL (ref 30.0–36.0)
MCV: 100 fL (ref 80.0–100.0)
Platelets: 281 10*3/uL (ref 150–400)
RBC: 4.26 MIL/uL (ref 4.22–5.81)
RDW: 16.1 % — ABNORMAL HIGH (ref 11.5–15.5)
WBC: 11.9 10*3/uL — ABNORMAL HIGH (ref 4.0–10.5)
nRBC: 0 % (ref 0.0–0.2)

## 2022-06-14 LAB — PROTIME-INR
INR: 2.1 — ABNORMAL HIGH (ref 0.8–1.2)
Prothrombin Time: 23.5 seconds — ABNORMAL HIGH (ref 11.4–15.2)

## 2022-06-14 MED ORDER — OXYMETAZOLINE HCL 0.05 % NA SOLN
1.0000 | Freq: Once | NASAL | Status: AC
  Administered 2022-06-14: 1 via NASAL
  Filled 2022-06-14: qty 30

## 2022-06-14 MED ORDER — LIDOCAINE-EPINEPHRINE 2 %-1:100000 IJ SOLN
20.0000 mL | Freq: Once | INTRAMUSCULAR | Status: AC
  Administered 2022-06-14: 20 mL
  Filled 2022-06-14: qty 1

## 2022-06-14 MED ORDER — TRANEXAMIC ACID FOR EPISTAXIS
500.0000 mg | Freq: Once | TOPICAL | Status: AC
  Administered 2022-06-14: 500 mg via TOPICAL
  Filled 2022-06-14: qty 10

## 2022-06-14 NOTE — ED Triage Notes (Signed)
Pt presents via POV with complaints of epistaxis that started around 0100. He states he was able to get the bleeding to stop, then it restarted about an hour ago. Pt has an LVAD device in place. Airway patient, respirations equal an unlabored. Denies CP or SOB.

## 2022-06-14 NOTE — Discharge Instructions (Addendum)
Your INR was 2.1.  Please call your LVAD coordinator to discuss potentially stopping the Plavix and follow-up with your ENT doctors.

## 2022-06-14 NOTE — ED Provider Notes (Signed)
Patient signed out to me pending reassessment after merocel placed for presumed anterior epixatis.   Patient having ongoing bleeding with Merisel in place.  I remove the Merisel and placed a anterior [employer] as I have seen this patient before and [employer]'s worked well for him in the past.  Bleeding subsequently stopped.  I did check his INR given he is on Coumadin has an LVAD it is 2.1.  Apparently is his goal is 2-2.5.  Hemoglobin is normal.  Patient follows with Dr. Tami Ribas with ENT, tells me that Dr. Tami Ribas did not want to have his nose packed again however given he was having ongoing epistaxis here did not feel we had another choice.  I recommend he follow-up with his LVAD coordinator as soon as they are discharged as well as the ENT specialist.  Apparently he is being referred for higher level of ENT care for possible embolization.  He is still on Coumadin they have discussed taking him off the Plavix I recommend that he discuss this with them again now that he is packed.  Epistaxis Management   Date/Time: 06/14/2022 8:49 AM   Performed by: Rada Hay, MD Authorized by: Rada Hay, MD   Consent:    Consent obtained:  Verbal   Risks discussed:  Pain, bleeding and nasal injury Universal protocol:    Patient identity confirmed:  Verbally with patient Anesthesia:    Anesthesia method:  None Procedure details:    Treatment site:  L anterior   Treatment method:  Nasal balloon   Treatment complexity:  Limited   Treatment episode: recurring   Post-procedure details:    Assessment:  Bleeding stopped   Procedure completion:  Tolerated     Rada Hay, MD 06/14/22 [phone]

## 2022-06-14 NOTE — ED Provider Notes (Signed)
[facility] Provider Note    Event Date/Time   First MD Initiated Contact with Patient 06/14/22 [phone]     (approximate)   History   Epistaxis   HPI  [name] is a 76 y.o. male who presents to the ED for evaluation of Epistaxis   Patient with LVAD in place on Coumadin with an INR goal of 2-2.5.  Multiple ED visits for left-sided epistaxis, often requiring packing.  I saw him twice in 1 day back in June, and after reviewing his visit I do remember him distinctly. He apparently has been referred to a specialist by his general ENT, Dr. Tami Ribas.  He presents to the ED for elevation of left-sided anterior epistaxis.  Atraumatic.  Started overnight around 1 AM, stopped, then recurred just prior to arrival.   Physical Exam   Triage Vital Signs: ED Triage Vitals  Enc Vitals Group     BP 06/14/22 0442 109/65     Pulse Rate 06/14/22 0442 78     Resp 06/14/22 0442 20     Temp 06/14/22 0442 97.7 F (36.5 C)     Temp Source 06/14/22 0442 Axillary     SpO2 06/14/22 0442 98 %     Weight 06/14/22 0440 184 lb 15.5 oz (83.9 kg)     Height 06/14/22 0440 '5\' 10"'$  (1.778 m)     Head Circumference --      Peak Flow --      Pain Score 06/14/22 0440 0     Pain Loc --      Pain Edu? --      Excl. in [place]? --     Most recent vital signs: Vitals:   06/14/22 0442  BP: 109/65  Pulse: 78  Resp: 20  Temp: 97.7 F (36.5 C)  SpO2: 98%    General: Awake, no distress.  CV:  Good peripheral perfusion.  Resp:  Normal effort.  Abd:  No distention.  MSK:  No deformity noted.  Neuro:  No focal deficits appreciated. Other:  Left-sided anterior epistaxis is controlled with nasal clamp, no posterior bleeding alongside this.   ED Results / Procedures / Treatments   Labs (all labs ordered are listed, but only abnormal results are displayed) Labs Reviewed - No data to display  EKG   RADIOLOGY   Official radiology report(s): No results  found.  PROCEDURES and INTERVENTIONS:  .Epistaxis Management  Date/Time: 06/14/2022 6:47 AM  Performed by: Vladimir Crofts, MD Authorized by: Vladimir Crofts, MD   Consent:    Consent obtained:  Verbal   Consent given by:  Patient   Risks, benefits, and alternatives were discussed: yes   Procedure details:    Treatment site:  L anterior   Treatment method:  Gel foam   Treatment complexity:  Limited   Treatment episode: initial   Post-procedure details:    Assessment:  Bleeding stopped   Procedure completion:  Tolerated well, no immediate complications Comments:     Merocel sponge placed and well-tolerated, inflated with 10 cc of topical TXA.   Medications - No data to display   IMPRESSION / MDM / [place] / ED COURSE  I reviewed the triage vital signs and the nursing notes.  Differential diagnosis includes, but is not limited to, spontaneous epistaxis, blood loss anemia, posterior epistaxis, trauma, fracture of the nasal bones or nasal septal hematoma  {Patient presents with symptoms of an acute illness or injury that is potentially life-threatening.  Anticoagulated  male presents with recurrence of atraumatic anterior epistaxis requiring Merisel placement and subsequent outpatient management and ENT follow-up.  He is hemodynamically stable and is hemostatic with nasal clamping.  We discussed treatment options and given that he is currently hemostatic, he is requesting packing because of the quick recurrence he often experiences if just managed medically.  Patient was understanding the risks and benefits of packing to include discomfort, further trauma, posterior bleeding, infection, etc.  I placed Merocel sponge, which is well-tolerated.  He already has clindamycin at home and is comfortable taking his medication and says he has plenty until he can follow-up with ENT to have this packing removed.  We discussed return precautions.  Clinical Course as of 06/14/22 0646  Thu  Jun 14, 2022  0624 Merocel sponge placed.  Well-tolerated.  He has clindamycin at home and he is familiar with taking this and will follow-up with ENT [DS]    Clinical Course User Index [DS] Vladimir Crofts, MD     FINAL CLINICAL IMPRESSION(S) / ED DIAGNOSES   Final diagnoses:  None     Rx / DC Orders   ED Discharge Orders     None        Note:  This document was prepared using Dragon voice recognition software and may include unintentional dictation errors.   Vladimir Crofts, MD 06/14/22 [phone]

## 2022-06-15 ENCOUNTER — Other Ambulatory Visit (HOSPITAL_COMMUNITY): Payer: Self-pay | Admitting: *Deleted

## 2022-06-15 DIAGNOSIS — Z95811 Presence of heart assist device: Secondary | ICD-10-CM

## 2022-06-15 DIAGNOSIS — Z7901 Long term (current) use of anticoagulants: Secondary | ICD-10-CM

## 2022-06-18 ENCOUNTER — Ambulatory Visit (HOSPITAL_COMMUNITY): Payer: Self-pay | Admitting: Pharmacist

## 2022-06-18 ENCOUNTER — Ambulatory Visit (HOSPITAL_COMMUNITY)
Admission: RE | Admit: 2022-06-18 | Discharge: 2022-06-18 | Disposition: A | Discharge status: Home / Self Care | Payer: Medicare Other | Source: Ambulatory Visit | Attending: Cardiology | Admitting: Cardiology

## 2022-06-18 DIAGNOSIS — Z86711 Personal history of pulmonary embolism: Secondary | ICD-10-CM | POA: Diagnosis not present

## 2022-06-18 DIAGNOSIS — Z87891 Personal history of nicotine dependence: Secondary | ICD-10-CM | POA: Diagnosis not present

## 2022-06-18 DIAGNOSIS — I420 Dilated cardiomyopathy: Secondary | ICD-10-CM | POA: Diagnosis not present

## 2022-06-18 DIAGNOSIS — I252 Old myocardial infarction: Secondary | ICD-10-CM | POA: Diagnosis not present

## 2022-06-18 DIAGNOSIS — Z7901 Long term (current) use of anticoagulants: Secondary | ICD-10-CM | POA: Insufficient documentation

## 2022-06-18 DIAGNOSIS — I2510 Atherosclerotic heart disease of native coronary artery without angina pectoris: Secondary | ICD-10-CM | POA: Diagnosis not present

## 2022-06-18 DIAGNOSIS — E119 Type 2 diabetes mellitus without complications: Secondary | ICD-10-CM | POA: Diagnosis not present

## 2022-06-18 DIAGNOSIS — I952 Hypotension due to drugs: Secondary | ICD-10-CM | POA: Diagnosis not present

## 2022-06-18 DIAGNOSIS — I442 Atrioventricular block, complete: Secondary | ICD-10-CM | POA: Diagnosis not present

## 2022-06-18 DIAGNOSIS — E785 Hyperlipidemia, unspecified: Secondary | ICD-10-CM | POA: Diagnosis not present

## 2022-06-18 DIAGNOSIS — N200 Calculus of kidney: Secondary | ICD-10-CM | POA: Diagnosis not present

## 2022-06-18 DIAGNOSIS — I110 Hypertensive heart disease with heart failure: Secondary | ICD-10-CM | POA: Diagnosis not present

## 2022-06-18 DIAGNOSIS — Z95811 Presence of heart assist device: Secondary | ICD-10-CM | POA: Insufficient documentation

## 2022-06-18 DIAGNOSIS — Z8249 Family history of ischemic heart disease and other diseases of the circulatory system: Secondary | ICD-10-CM | POA: Diagnosis not present

## 2022-06-18 DIAGNOSIS — I2782 Chronic pulmonary embolism: Secondary | ICD-10-CM | POA: Diagnosis not present

## 2022-06-18 DIAGNOSIS — Z86718 Personal history of other venous thrombosis and embolism: Secondary | ICD-10-CM | POA: Diagnosis not present

## 2022-06-18 DIAGNOSIS — Z95810 Presence of automatic (implantable) cardiac defibrillator: Secondary | ICD-10-CM | POA: Diagnosis not present

## 2022-06-18 DIAGNOSIS — I6529 Occlusion and stenosis of unspecified carotid artery: Secondary | ICD-10-CM | POA: Diagnosis not present

## 2022-06-18 DIAGNOSIS — I5022 Chronic systolic (congestive) heart failure: Secondary | ICD-10-CM | POA: Diagnosis not present

## 2022-06-18 DIAGNOSIS — I509 Heart failure, unspecified: Secondary | ICD-10-CM | POA: Diagnosis not present

## 2022-06-18 DIAGNOSIS — K219 Gastro-esophageal reflux disease without esophagitis: Secondary | ICD-10-CM | POA: Diagnosis present

## 2022-06-18 DIAGNOSIS — I4901 Ventricular fibrillation: Secondary | ICD-10-CM | POA: Diagnosis not present

## 2022-06-18 DIAGNOSIS — I472 Ventricular tachycardia, unspecified: Secondary | ICD-10-CM | POA: Diagnosis not present

## 2022-06-18 DIAGNOSIS — R040 Epistaxis: Secondary | ICD-10-CM | POA: Diagnosis not present

## 2022-06-18 DIAGNOSIS — E669 Obesity, unspecified: Secondary | ICD-10-CM | POA: Diagnosis not present

## 2022-06-18 DIAGNOSIS — Z8679 Personal history of other diseases of the circulatory system: Secondary | ICD-10-CM | POA: Diagnosis not present

## 2022-06-18 DIAGNOSIS — I255 Ischemic cardiomyopathy: Secondary | ICD-10-CM | POA: Diagnosis present

## 2022-06-18 DIAGNOSIS — M459 Ankylosing spondylitis of unspecified sites in spine: Secondary | ICD-10-CM | POA: Diagnosis not present

## 2022-06-18 DIAGNOSIS — K222 Esophageal obstruction: Secondary | ICD-10-CM | POA: Diagnosis not present

## 2022-06-18 DIAGNOSIS — T4275XA Adverse effect of unspecified antiepileptic and sedative-hypnotic drugs, initial encounter: Secondary | ICD-10-CM | POA: Diagnosis not present

## 2022-06-18 LAB — CBC
HCT: 41.5 % (ref 39.0–52.0)
Hemoglobin: 13.1 g/dL (ref 13.0–17.0)
MCH: 32.4 pg (ref 26.0–34.0)
MCHC: 31.6 g/dL (ref 30.0–36.0)
MCV: 102.7 fL — ABNORMAL HIGH (ref 80.0–100.0)
Platelets: 243 10*3/uL (ref 150–400)
RBC: 4.04 MIL/uL — ABNORMAL LOW (ref 4.22–5.81)
RDW: 16 % — ABNORMAL HIGH (ref 11.5–15.5)
WBC: 9.5 10*3/uL (ref 4.0–10.5)
nRBC: 0 % (ref 0.0–0.2)

## 2022-06-18 LAB — BASIC METABOLIC PANEL
Anion gap: 8 (ref 5–15)
BUN: 17 mg/dL (ref 8–23)
CO2: 29 mmol/L (ref 22–32)
Calcium: 9.1 mg/dL (ref 8.9–10.3)
Chloride: 101 mmol/L (ref 98–111)
Creatinine, Ser: 1.09 mg/dL (ref 0.61–1.24)
GFR, Estimated: >60 mL/min (ref >60)
Glucose, Bld: 138 mg/dL — ABNORMAL HIGH (ref 70–99)
Potassium: 4.4 mmol/L (ref 3.5–5.1)
Sodium: 138 mmol/L (ref 135–145)

## 2022-06-18 LAB — LACTATE DEHYDROGENASE: LDH: 243 U/L — ABNORMAL HIGH (ref 98–192)

## 2022-06-18 LAB — PROTIME-INR
INR: 2.1 — ABNORMAL HIGH (ref 0.8–1.2)
Prothrombin Time: 23.1 seconds — ABNORMAL HIGH (ref 11.4–15.2)

## 2022-06-19 ENCOUNTER — Ambulatory Visit (INDEPENDENT_AMBULATORY_CARE_PROVIDER_SITE_OTHER): Payer: Medicare Other | Admitting: Vascular Surgery

## 2022-06-19 DIAGNOSIS — R040 Epistaxis: Secondary | ICD-10-CM

## 2022-06-19 DIAGNOSIS — E785 Hyperlipidemia, unspecified: Secondary | ICD-10-CM | POA: Insufficient documentation

## 2022-06-19 DIAGNOSIS — I2782 Chronic pulmonary embolism: Secondary | ICD-10-CM

## 2022-06-19 DIAGNOSIS — Z95811 Presence of heart assist device: Secondary | ICD-10-CM | POA: Diagnosis not present

## 2022-06-19 DIAGNOSIS — I2510 Atherosclerotic heart disease of native coronary artery without angina pectoris: Secondary | ICD-10-CM | POA: Diagnosis not present

## 2022-06-19 NOTE — Progress Notes (Signed)
Patient ID: Scott [name], male   DOB: Aug 10, 1945, 76 y.o.   MRN: [id number]  Chief Complaint  Patient presents with   New Patient (Initial Visit)    Ref Tami Ribas consult chronic epistaxis    HPI Scott [name] is a 76 y.o. male.  I am asked to see the patient by Dr. Tami Ribas and the [facility] ER for evaluation of nosebleeds.  The patient was seen in the emergency department on 12/14 and has had a Rhino Rocket in place in his left nostril since that time.  He had been to the emergency room about 2 weeks before that as well as about 6 months ago.  His most recent episodes, it was just the left side that was bleeding.  He has a very complicated and complex medical and cardiac history.  He has had an LVAD in place for about 8 years.  He has a previous history of pulmonary embolus and has been on chronic anticoagulation therapy.  He remains on Coumadin at this point.  He has had some adjustments with other medications, but recently his INR has been in the 2-2.5 range which is desirable.  He is very anxious to have the packing from his nose removed but without the packing he has had severe epistaxis on that side.  No trauma or injury.  He is on Coumadin, Plavix, and was previously on aspirin as well.   Past Medical History:  Diagnosis Date   Acute on chronic respiratory failure ([place])    a. 08/2014 in setting of PE.   Ankylosing spondylitis ([place])    Bilateral pulmonary embolism ([place])    a. 08/2014 - started on Coumadin. Retrievable IVC filter placed 08/27/14 due to RV strain and large clot burden.   CAD (coronary artery disease)    a. stenting of LCx 2013; b. STEMI 06/12/14 s/p PCI to LAD complicated by post cath shock requiring IABP; VT s/p DCCV, EF 20%; c. NSTEMI 06/26/14 treated medically.   Carotid artery disease ([place])    a. s/p stenting.   Chronic systolic CHF (congestive heart failure) ([place])    Diabetes ([place])    Hemoptysis    a. 08/2014 possibly due to pulm infarct/PE.   Hyperlipidemia     Hypertension    Hypotension    Ischemic cardiomyopathy    a. echo 08/23/2014 EF <20%, dilated CM, mod MR/TR   Leukocytosis    Leukocytosis 09/10/2014   Nephrolithiasis    Pleural effusion on right 08/2014 - small   Reactive thrombocytosis 09/10/2014   Right leg DVT ([place])    a. 08/2014.   Ventricular tachycardia ([place])    a. 06/2014 at time of MI, s/p DCCV.    Past Surgical History:  Procedure Laterality Date   BEDSIDE EVACUATION OF HEMATOMA  10/05/2014   Procedure: EVACUATION OF HEMATOMA;  Surgeon: Ivin Poot, MD;  Location: [place];  Service: Open Heart Surgery;;   CHOLECYSTECTOMY N/A 02/21/2017   Procedure: LAPAROSCOPIC CHOLECYSTECTOMY;  Surgeon: Rolm Bookbinder, MD;  Location: [place];  Service: General;  Laterality: N/A;   ICD IMPLANT N/A 11/14/2021   Procedure: ICD IMPLANT;  Surgeon: Constance Haw, MD;  Location: [place] [facility];  Service: Cardiovascular;  Laterality: N/A;   INSERTION OF IMPLANTABLE LEFT VENTRICULAR ASSIST DEVICE N/A 09/21/2014   Procedure: INSERTION OF IMPLANTABLE LEFT VENTRICULAR ASSIST DEVICE;  Surgeon: Ivin Poot, MD;  Location: [place];  Service: Open Heart Surgery;  Laterality: N/A;  CIRC ARREST  NITRIC OXIDE  LEFT HEART CATHETERIZATION WITH CORONARY ANGIOGRAM N/A 06/12/2014   Procedure: LEFT HEART CATHETERIZATION WITH CORONARY ANGIOGRAM;  Surgeon: Lorretta Harp, MD;  Location: [facility] LAB;  Service: Cardiovascular;  Laterality: N/A;   RIGHT HEART CATHETERIZATION N/A 09/10/2014   Procedure: RIGHT HEART CATH;  Surgeon: [name], MD;  Location: [facility] CATH LAB;  Service: Cardiovascular;  Laterality: N/A;   RIGHT HEART CATHETERIZATION N/A 09/17/2014   Procedure: RIGHT HEART CATH;  Surgeon: [name], MD;  Location: [facility] CATH LAB;  Service: Cardiovascular;  Laterality: N/A;   TEE WITHOUT CARDIOVERSION N/A 09/21/2014   Procedure: TRANSESOPHAGEAL ECHOCARDIOGRAM (TEE);  Surgeon: Ivin Poot, MD;  Location: [place];  Service: Open Heart  Surgery;  Laterality: N/A;   TEE WITHOUT CARDIOVERSION N/A 10/05/2014   Procedure: TRANSESOPHAGEAL ECHOCARDIOGRAM (TEE);  Surgeon: Ivin Poot, MD;  Location: [place];  Service: Open Heart Surgery;  Laterality: N/A;   VIDEO ASSISTED THORACOSCOPY (VATS)/THOROCOTOMY Left 10/06/2014   Procedure: VIDEO ASSISTED THORACOSCOPY (VATS)/THOROCOTOMY;  Surgeon: Ivin Poot, MD;  Location: [place];  Service: Open Heart Surgery;  Laterality: Left;     Family History  Problem Relation Age of Onset   Hypertension Father    Heart attack Father    Heart Problems Sister    Colon cancer Neg Hx    Stomach cancer Neg Hx    Esophageal cancer Neg Hx    Colon polyps Neg Hx       Social History   Tobacco Use   Smoking status: Former    Packs/day: 0.30    Years: 0.00    Total pack years: 0.00    Types: Cigarettes    Quit date: 06/12/1969    Years since quitting: 53.0   Smokeless tobacco: Never   Tobacco comments:    pt smoked while in [employer] 2-3 cig x 6 months.  Vaping Use   Vaping Use: Former  Substance Use Topics   Alcohol use: No    Alcohol/week: 0.0 standard drinks of alcohol   Drug use: No     Allergies  Allergen Reactions   Biopatch [Chlorhexidine] Dermatitis and Rash   Desyrel [Trazodone] Other (See Comments)    insomnia   Other Dermatitis    Adhesive    Current Outpatient Medications  Medication Sig Dispense Refill   acetaminophen (TYLENOL) 500 MG tablet Take 1,000 mg by mouth every 6 (six) hours as needed for moderate pain.      amiodarone (PACERONE) 200 MG tablet Take 0.5 tablets (100 mg total) by mouth daily. 45 tablet 3   cephALEXin (KEFLEX) 500 MG capsule Take 1 capsule (500 mg total) by mouth 2 (two) times daily. 270 capsule 3   clobetasol ointment (TEMOVATE) 8.91 % Apply 1 application topically 2 (two) times daily. (Patient taking differently: Apply 1 application  topically 2 (two) times daily. As needed) 30 g 2   clopidogrel (PLAVIX) 75 MG tablet Take 1 tablet (75 mg  total) by mouth daily. 90 tablet 3   cyclobenzaprine (FLEXERIL) 5 MG tablet Take 1 tablet (5 mg total) by mouth 3 (three) times daily as needed for muscle spasms. 90 tablet 5   furosemide (LASIX) 20 MG tablet Take 2 tablets (40 mg total) by mouth daily. Take 40 mg daily 120 tablet 3   pantoprazole (PROTONIX) 40 MG tablet TAKE 1 TABLET (40 MG TOTAL) BY MOUTH TWICE A DAY BEFORE MEALS (Patient taking differently: 40 mg 2 (two) times daily.) 60 tablet 3   potassium chloride SA (  KLOR-CON M) 20 MEQ tablet Take 2 tablets (40 mEq total) by mouth daily. 20 mEq daily WITH furosemide (take 40 mEq if taking 40 mg of furosemide) 180 tablet 0   rosuvastatin (CRESTOR) 10 MG tablet Take 1 tablet (10 mg total) by mouth every Monday, Wednesday, and Friday. 40 tablet 3   traMADol (ULTRAM) 50 MG tablet Take 2 tablets (100 mg total) by mouth every 6 (six) hours as needed for moderate pain. (Patient taking differently: Take 50 mg by mouth daily as needed for moderate pain.) 120 tablet 3   traZODone (DESYREL) 50 MG tablet Take 50 mg by mouth at bedtime.     warfarin (COUMADIN) 6 MG tablet TAKE 1 TABLET BY MOUTH EVERY DAY (Patient taking differently: Take 6 mg by mouth daily at 4 PM. TAKE 1 TABLET (6 mg) on FRIDAY, and 1/2 TABLET (3 mg) ALL OTHER DAYS BY MOUTH EVERY DAY OR AS DIRECTED BY HEART FAILURE CLINIC) 30 tablet 6   No current facility-administered medications for this visit.      REVIEW OF SYSTEMS (Negative unless checked)  Constitutional: '[]'$ Weight loss  '[]'$ Fever  '[]'$ Chills Cardiac: '[]'$ Chest pain   '[]'$ Chest pressure   '[x]'$ Palpitations   '[]'$ Shortness of breath when laying flat   '[]'$ Shortness of breath at rest   '[x]'$ Shortness of breath with exertion. Vascular:  '[]'$ Pain in legs with walking   '[]'$ Pain in legs at rest   '[]'$ Pain in legs when laying flat   '[]'$ Claudication   '[]'$ Pain in feet when walking  '[]'$ Pain in feet at rest  '[]'$ Pain in feet when laying flat   '[]'$ History of DVT   '[]'$ Phlebitis   '[]'$ Swelling in legs   '[]'$ Varicose veins    '[]'$ Non-healing ulcers Pulmonary:   '[x]'$ Uses home oxygen   '[]'$ Productive cough   '[]'$ Hemoptysis   '[]'$ Wheeze  '[x]'$ COPD   '[]'$ Asthma Neurologic:  '[]'$ Dizziness  '[]'$ Blackouts   '[]'$ Seizures   '[]'$ History of stroke   '[]'$ History of TIA  '[]'$ Aphasia   '[]'$ Temporary blindness   '[]'$ Dysphagia   '[]'$ Weakness or numbness in arms   '[]'$ Weakness or numbness in legs Musculoskeletal:  '[x]'$ Arthritis   '[]'$ Joint swelling   '[]'$ Joint pain   '[]'$ Low back pain Hematologic:  '[]'$ Easy bruising  '[]'$ Easy bleeding   '[]'$ Hypercoagulable state   '[]'$ Anemic  '[]'$ Hepatitis X positive for epistaxis Gastrointestinal:  '[]'$ Blood in stool   '[]'$ Vomiting blood  '[]'$ Gastroesophageal reflux/heartburn   '[]'$ Abdominal pain Genitourinary:  '[]'$ Chronic kidney disease   '[]'$ Difficult urination  '[]'$ Frequent urination  '[]'$ Burning with urination   '[]'$ Hematuria Skin:  '[]'$ Rashes   '[]'$ Ulcers   '[]'$ Wounds Psychological:  '[]'$ History of anxiety   '[]'$  History of major depression.    Physical Exam There were no vitals taken for this visit. Gen:  WD/WN, NAD Head: [place]/AT, No temporalis wasting.  Ear/Nose/Throat: Hearing grossly intact, Rhinorocket in place in left nare Eyes: Conjunctiva clear, sclera non-icteric  Neck: trachea midline.  No JVD.  Pulmonary:  Good air movement, respirations not labored, no use of accessory muscles on supplemental oxygen  Cardiac: Irregular, LVAD present Vascular:  Vessel Right Left  Radial Palpable Palpable                                   Gastrointestinal:. No masses, surgical incisions, or scars. Musculoskeletal: M/S 5/5 throughout.  Extremities without ischemic changes.  No deformity or atrophy. Mild LE edema. Neurologic: Sensation grossly intact in extremities.  Symmetrical.  Speech is fluent. Motor exam as listed above. Psychiatric: Judgment  intact, Mood & affect appropriate for pt's clinical situation. Dermatologic: No rashes or ulcers noted.  No cellulitis or open wounds.    Radiology No results found.  Labs Recent Results (from the past 2160  hour(s))  CUP PACEART REMOTE DEVICE CHECK     Status: None   Collection Time: 03/29/22  1:54 PM  Result Value Ref Range   Date Time Interrogation Session [id number]    Pulse Generator Manufacturer MERM    Pulse Gen Model [id number] Claria MRI CRT-D    Pulse Gen Serial Number [id number]    Clinic Name [facility]    Implantable Pulse Generator Type Cardiac Resynch Therapy Defibulator    Implantable Pulse Generator Implant Date 77939030    Implantable Lead Manufacturer MERM    Implantable Lead Model 3830 SelectSecure    Implantable Lead Serial Number [id number]    Implantable Lead Implant Date 09233007    Implantable Lead Location Detail 1 UNKNOWN    Implantable Lead Special Function Left bundle    Implantable Lead Location [id number]    Implantable Lead Manufacturer MERM    Implantable Lead Model 5076 CapSureFix Novus    Implantable Lead Serial Number [id number]    Implantable Lead Implant Date 62263335    Implantable Lead Location Detail 1 UNKNOWN    Implantable Lead Location [id number]    Implantable Lead Manufacturer [facility]    Implantable Lead Model [id number] Sprint Quattro Secure S    Implantable Lead Serial Number [id number]    Implantable Lead Implant Date 63893734    Implantable Lead Location Detail 1 UNKNOWN    Implantable Lead Location [id number]    Lead Channel Setting Sensing Sensitivity 0.3 mV   Lead Channel Setting Pacing Amplitude 2.5 V   Lead Channel Setting Pacing Pulse Width 0.03 ms   Lead Channel Setting Pacing Amplitude 0.5 V   Lead Channel Setting Pacing Pulse Width 0.4 ms   Lead Channel Setting Pacing Amplitude 2.75 V   Lead Channel Setting Pacing Capture Mode Adaptive Capture    Lead Channel Impedance Value 456 ohm   Lead Channel Sensing Intrinsic Amplitude 1.875 mV   Lead Channel Sensing Intrinsic Amplitude 1.875 mV   Lead Channel Impedance Value 437 ohm   Lead Channel Impedance Value 342 ohm   Lead Channel Sensing Intrinsic Amplitude 7.625 mV   Lead Channel  Pacing Threshold Amplitude 0.75 V   Lead Channel Pacing Threshold Pulse Width 0.4 ms   HighPow Impedance 68 ohm   Lead Channel Impedance Value 570 ohm   Lead Channel Impedance Value 342 ohm   Lead Channel Impedance Value 323 ohm   Lead Channel Pacing Threshold Amplitude 0.75 V   Lead Channel Pacing Threshold Pulse Width 0.4 ms   Battery Status OK    Battery Remaining Longevity 80 mo   Battery Voltage 3.03 V   Brady Statistic RA Percent Paced 98.88 %   Brady Statistic RV Percent Paced 99.86 %   Brady Statistic AP VP Percent 98.88 %   Brady Statistic AS VP Percent 1.05 %   Brady Statistic AP VS Percent 0.05 %   Brady Statistic AS VS Percent 0.02 %  Protime-INR     Status: Abnormal   Collection Time: 04/05/22  9:45 AM  Result Value Ref Range   Prothrombin Time 23.1 (H) 11.4 - 15.2 seconds   INR 2.1 (H) 0.8 - 1.2    Comment: (NOTE) INR goal varies based on device and disease states. Performed at [facility], [place] [address],  [place], [place] [address]   Lactate dehydrogenase     Status: Abnormal   Collection Time: 04/05/22  9:45 AM  Result Value Ref Range   LDH 272 (H) 98 - 192 U/L    Comment: Performed at [facility] [address], [place], [place] [address]  TSH     Status: Abnormal   Collection Time: 04/16/22  9:12 AM  Result Value Ref Range   TSH 7.056 (H) 0.350 - 4.500 uIU/mL    Comment: Performed by a 3rd Generation assay with a functional sensitivity of <=0.01 uIU/mL. Performed at [facility], [place] [address] [place], [place] [address]   T4, free     Status: None   Collection Time: 04/16/22  9:13 AM  Result Value Ref Range   Free T4 1.03 0.61 - 1.12 ng/dL    Comment: (NOTE) Biotin ingestion may interfere with free T4 tests. If the results are inconsistent with the TSH level, previous test results, or the clinical presentation, then consider biotin interference. If needed, order repeat testing after stopping biotin. Performed at [facility], [place] [address] [place], [place] [address]   T3, free     Status: None   Collection Time: 04/16/22  9:13 AM  Result Value Ref Range   T3, Free 2.5 2.0 - 4.4 pg/mL    Comment: (NOTE) Performed At: [facility] [place], [place] [id number] Rush Farmer MD [id number]   Protime-INR     Status: Abnormal   Collection Time: 04/16/22  9:13 AM  Result Value Ref Range   Prothrombin Time 24.3 (H) 11.4 - 15.2 seconds   INR 2.2 (H) 0.8 - 1.2    Comment: (NOTE) INR goal varies based on device and disease states. Performed at [facility], [place] [address], [place], [place] [address]   Prealbumin     Status: None   Collection Time: 04/16/22  9:13 AM  Result Value Ref Range   Prealbumin 18 18 - 38 mg/dL    Comment: Performed at [facility] [address] [place], [place] [address]  Lactate dehydrogenase     Status: Abnormal   Collection Time: 04/16/22  9:13 AM  Result Value Ref Range   LDH 290 (H) 98 - 192 U/L    Comment: Performed at [facility] [address], [place], [place] [address]  CBC     Status: Abnormal   Collection Time: 04/16/22  9:13 AM  Result Value Ref Range   WBC 6.3 4.0 - 10.5 K/uL   RBC 5.07 4.22 - 5.81 MIL/uL   Hemoglobin 15.1 13.0 - 17.0 g/dL   HCT 48.4 39.0 - 52.0 %   MCV 95.5 80.0 - 100.0 fL   MCH 29.8 26.0 - 34.0 pg   MCHC 31.2 30.0 - 36.0 g/dL   RDW 25.3 (H) 11.5 - 15.5 %   Platelets 226 150 - 400 K/uL   nRBC 0.0 0.0 - 0.2 %    Comment: Performed at [facility], [place] [address], [place],  [address]  Comprehensive metabolic panel     Status: Abnormal   Collection Time: 04/16/22  9:13 AM  Result Value Ref Range   Sodium 143 135 - 145 mmol/L   Potassium 4.8 3.5 - 5.1 mmol/L   Chloride 104 98 - 111 mmol/L   CO2 30 22 - 32 mmol/L   Glucose, Bld 110 (H) 70 - 99 mg/dL  Comment: Glucose reference range applies only to samples taken after fasting for at least 8 hours.   BUN 17 8 - 23 mg/dL    Creatinine, Ser 1.22 0.61 - 1.24 mg/dL   Calcium 10.1 8.9 - 10.3 mg/dL   Total Protein 8.4 (H) 6.5 - 8.1 g/dL   Albumin 4.3 3.5 - 5.0 g/dL   AST 34 15 - 41 U/L   ALT 19 0 - 44 U/L   Alkaline Phosphatase 142 (H) 38 - 126 U/L   Total Bilirubin 1.2 0.3 - 1.2 mg/dL   GFR, Estimated >60 >60 mL/min    Comment: (NOTE) Calculated using the CKD-EPI Creatinine Equation (2021)    Anion gap 9 5 - 15    Comment: Performed at [facility] [address], [place], [place] [address]  Lactate dehydrogenase     Status: Abnormal   Collection Time: 05/09/22  9:41 AM  Result Value Ref Range   LDH 328 (H) 98 - 192 U/L    Comment: Performed at [facility] [address], [place], [place] [address]  Protime-INR     Status: Abnormal   Collection Time: 05/09/22  9:41 AM  Result Value Ref Range   Prothrombin Time 34.0 (H) 11.4 - 15.2 seconds   INR 3.4 (H) 0.8 - 1.2    Comment: (NOTE) INR goal varies based on device and disease states. Performed at [facility], [place] [address] [place], [place] [address]   Lactate dehydrogenase     Status: Abnormal   Collection Time: 05/23/22  9:23 AM  Result Value Ref Range   LDH 286 (H) 98 - 192 U/L    Comment: Performed at [name], [place] [address] [place], [place] [address]  Protime-INR     Status: Abnormal   Collection Time: 05/23/22  9:23 AM  Result Value Ref Range   Prothrombin Time 27.8 (H) 11.4 - 15.2 seconds   INR 2.6 (H) 0.8 - 1.2    Comment: (NOTE) INR goal varies based on device and disease states. Performed at [facility], [place] [address], [place] [address]   CBC with Differential     Status: Abnormal   Collection Time: 05/31/22 11:51 PM  Result Value Ref Range   WBC 7.2 4.0 - 10.5 K/uL   RBC 4.74 4.22 - 5.81 MIL/uL   Hemoglobin 15.2 13.0 - 17.0 g/dL   HCT 47.0 39.0 - 52.0 %   MCV 99.2 80.0 - 100.0 fL   MCH 32.1 26.0 - 34.0 pg   MCHC 32.3 30.0 - 36.0 g/dL   RDW 17.9 (H) 11.5 - 15.5 %    Platelets 218 150 - 400 K/uL   nRBC 0.0 0.0 - 0.2 %   Neutrophils Relative % 76 %   Neutro Abs 5.5 1.7 - 7.7 K/uL   Lymphocytes Relative 9 %   Lymphs Abs 0.7 0.7 - 4.0 K/uL   Monocytes Relative 12 %   Monocytes Absolute 0.9 0.1 - 1.0 K/uL   Eosinophils Relative 2 %   Eosinophils Absolute 0.2 0.0 - 0.5 K/uL   Basophils Relative 1 %   Basophils Absolute 0.0 0.0 - 0.1 K/uL   Immature Granulocytes 0 %   Abs Immature Granulocytes 0.03 0.00 - 0.07 K/uL    Comment: Performed at [facility], [address], [place], [place] [address]  Comprehensive metabolic panel     Status: Abnormal   Collection Time: 05/31/22 11:51 PM  Result Value Ref Range   Sodium 138 135 - 145 mmol/L   Potassium 4.1 3.5 - 5.1 mmol/L   Chloride 103 98 - 111 mmol/L   CO2 28 22 - 32 mmol/L   Glucose, Bld 161 (H) 70 - 99 mg/dL    Comment: Glucose reference range applies only to samples taken after fasting for at least 8 hours.   BUN 18 8 - 23 mg/dL   Creatinine, Ser 1.21 0.61 - 1.24 mg/dL   Calcium 9.5 8.9 - 10.3 mg/dL   Total Protein 9.3 (H) 6.5 - 8.1 g/dL   Albumin 4.1 3.5 - 5.0 g/dL   AST 36 15 - 41 U/L   ALT 18 0 - 44 U/L   Alkaline Phosphatase 151 (H) 38 - 126 U/L   Total Bilirubin 1.8 (H) 0.3 - 1.2 mg/dL   GFR, Estimated >60 >60 mL/min    Comment: (NOTE) Calculated using the CKD-EPI Creatinine Equation (2021)    Anion gap 7 5 - 15    Comment: Performed at [facility], [place]., [place], [place] [address]  Protime-INR     Status: Abnormal   Collection Time: 05/31/22 11:51 PM  Result Value Ref Range   Prothrombin Time 28.9 (H) 11.4 - 15.2 seconds   INR 2.8 (H) 0.8 - 1.2    Comment: (NOTE) INR goal varies based on device and disease states. Performed at [facility], [place]., [place], [place] [address]   INR/PT     Status: Abnormal   Collection Time: 06/06/22 12:04 PM  Result Value Ref Range   Prothrombin Time 24.4 (H) 11.4 - 15.2 seconds   INR 2.2 (H)  0.8 - 1.2    Comment: (NOTE) INR goal varies based on device and disease states. Performed at [facility], [place] [address] [place], [place] [address]   Protime-INR     Status: Abnormal   Collection Time: 06/11/22  8:33 AM  Result Value Ref Range   Prothrombin Time 19.9 (H) 11.4 - 15.2 seconds   INR 1.7 (H) 0.8 - 1.2    Comment: (NOTE) INR goal varies based on device and disease states. Performed at [facility], [place] [address] [place], [place] [address]   Protime-INR     Status: Abnormal   Collection Time: 06/14/22  8:05 AM  Result Value Ref Range   Prothrombin Time 23.5 (H) 11.4 - 15.2 seconds   INR 2.1 (H) 0.8 - 1.2    Comment: (NOTE) INR goal varies based on device and disease states. Performed at [facility], [place] [place], [place] [address]   CBC     Status: Abnormal   Collection Time: 06/14/22  8:05 AM  Result Value Ref Range   WBC 11.9 (H) 4.0 - 10.5 K/uL   RBC 4.26 4.22 - 5.81 MIL/uL   Hemoglobin 13.7 13.0 - 17.0 g/dL   HCT 42.6 39.0 - 52.0 %   MCV 100.0 80.0 - 100.0 fL   MCH 32.2 26.0 - 34.0 pg   MCHC 32.2 30.0 - 36.0 g/dL   RDW 16.1 (H) 11.5 - 15.5 %   Platelets 281 150 - 400 K/uL   nRBC 0.0 0.0 - 0.2 %    Comment: Performed at [facility], [place] [place], [place] [address]  Protime-INR     Status: Abnormal   Collection Time: 06/18/22  8:35 AM  Result Value Ref Range   Prothrombin Time 23.1 (H) 11.4 -  15.2 seconds   INR 2.1 (H) 0.8 - 1.2    Comment: (NOTE) INR goal varies based on device and disease states. Performed at [facility], [place] [address], [place], [place] [address]   Lactate dehydrogenase     Status: Abnormal   Collection Time: 06/18/22  8:35 AM  Result Value Ref Range   LDH 243 (H) 98 - 192 U/L    Comment: Performed at [facility], [place] [address], [place], [place] [address]  Basic metabolic panel     Status: Abnormal   Collection Time: 06/18/22  8:35 AM  Result Value Ref  Range   Sodium 138 135 - 145 mmol/L   Potassium 4.4 3.5 - 5.1 mmol/L   Chloride 101 98 - 111 mmol/L   CO2 29 22 - 32 mmol/L   Glucose, Bld 138 (H) 70 - 99 mg/dL    Comment: Glucose reference range applies only to samples taken after fasting for at least 8 hours.   BUN 17 8 - 23 mg/dL   Creatinine, Ser 1.09 0.61 - 1.24 mg/dL   Calcium 9.1 8.9 - 10.3 mg/dL   GFR, Estimated >60 >60 mL/min    Comment: (NOTE) Calculated using the CKD-EPI Creatinine Equation (2021)    Anion gap 8 5 - 15    Comment: Performed at [facility] [address] [place], [place] [address]  CBC     Status: Abnormal   Collection Time: 06/18/22  8:35 AM  Result Value Ref Range   WBC 9.5 4.0 - 10.5 K/uL   RBC 4.04 (L) 4.22 - 5.81 MIL/uL   Hemoglobin 13.1 13.0 - 17.0 g/dL   HCT 41.5 39.0 - 52.0 %   MCV 102.7 (H) 80.0 - 100.0 fL   MCH 32.4 26.0 - 34.0 pg   MCHC 31.6 30.0 - 36.0 g/dL   RDW 16.0 (H) 11.5 - 15.5 %   Platelets 243 150 - 400 K/uL   nRBC 0.0 0.0 - 0.2 %    Comment: Performed at [facility], [place] [address], [place], [place] [address]    Assessment/Plan:  Epistaxis The patient has had severe epistaxis from the left side requiring packing twice in the last 3 weeks.  He has to be chronically anticoagulated for a variety of reasons and has very severe heart disease with an LVAD in place.  This is an extremely difficult and complex situation particularly given his complex medical and cardiac history.  Surgery is very difficult and challenging and holding his anticoagulation and putting him to sleep is less than ideal.  I think that maxillary artery embolization is a reasonable option in this situation to avoid general anesthesia and cessation of anticoagulation.  We can likely proceed with his embolization as long as his INR is less than 2.5.  I want to try to get that done later this week as tolerated.  I told him that I would not remove the packing and would defer that to Dr. Tami Ribas and his  team but doubt that it would be a great idea to remove this until he has had the embolization.  He is scheduled to see his cardiologist Dr. Haroldine Laws tomorrow.  I had a long discussion with he and his wife regarding this complex and difficult situation and they desire to proceed with embolization as soon as possible.  LVAD (left ventricular assist device) present [facility]) Follows with the heart failure clinic.  On anticoagulation.  Pulmonary embolus On chronic  anticoagulation  Hyperlipidemia lipid control important in reducing the progression of atherosclerotic disease. Continue statin therapy      Leotis Pain 06/19/2022, 2:56 PM   This note was created with Dragon medical transcription system.  Any errors from dictation are unintentional.

## 2022-06-19 NOTE — Assessment & Plan Note (Signed)
The patient has had severe epistaxis from the left side requiring packing twice in the last 3 weeks.  Scott [name] has to be chronically anticoagulated for a variety of reasons and has very severe heart disease with an LVAD in place.  This is an extremely difficult and complex situation particularly given Scott complex medical and cardiac history.  Surgery is very difficult and challenging and holding Scott anticoagulation and putting him to sleep is less than ideal.  I think that maxillary artery embolization is a reasonable option in this situation to avoid general anesthesia and cessation of anticoagulation.  We can likely proceed with Scott embolization as long as Scott INR is less than 2.5.  I want to try to get that done later this week as tolerated.  I told him that I would not remove the packing and would defer that to Scott [name] and Scott team but doubt that it would be a great idea to remove this until Scott [name] has had the embolization.  Scott [name] is scheduled to see Scott cardiologist Scott [name] tomorrow.  I had a long discussion with Scott [name] and Scott [name] regarding this complex and difficult situation and they desire to proceed with embolization as soon as possible.

## 2022-06-19 NOTE — Assessment & Plan Note (Signed)
On chronic anticoagulation. 

## 2022-06-19 NOTE — Assessment & Plan Note (Signed)
Follows with the heart failure clinic.  On anticoagulation.

## 2022-06-19 NOTE — Assessment & Plan Note (Signed)
lipid control important in reducing the progression of atherosclerotic disease. Continue statin therapy  

## 2022-06-20 ENCOUNTER — Encounter (HOSPITAL_COMMUNITY): Payer: Self-pay | Admitting: Internal Medicine

## 2022-06-20 ENCOUNTER — Other Ambulatory Visit (HOSPITAL_COMMUNITY): Payer: Self-pay | Admitting: Interventional Radiology

## 2022-06-20 ENCOUNTER — Ambulatory Visit (HOSPITAL_BASED_OUTPATIENT_CLINIC_OR_DEPARTMENT_OTHER)
Admission: RE | Admit: 2022-06-20 | Discharge: 2022-06-20 | Disposition: A | Discharge status: Home / Self Care | Payer: Medicare Other | Source: Ambulatory Visit | Attending: Internal Medicine | Admitting: Internal Medicine

## 2022-06-20 ENCOUNTER — Encounter (HOSPITAL_COMMUNITY): Payer: Self-pay

## 2022-06-20 VITALS — BP 90/0 | HR 81 | Wt 191.0 lb

## 2022-06-20 DIAGNOSIS — Z86711 Personal history of pulmonary embolism: Secondary | ICD-10-CM | POA: Insufficient documentation

## 2022-06-20 DIAGNOSIS — I959 Hypotension, unspecified: Secondary | ICD-10-CM | POA: Insufficient documentation

## 2022-06-20 DIAGNOSIS — N200 Calculus of kidney: Secondary | ICD-10-CM | POA: Insufficient documentation

## 2022-06-20 DIAGNOSIS — Z7901 Long term (current) use of anticoagulants: Secondary | ICD-10-CM | POA: Insufficient documentation

## 2022-06-20 DIAGNOSIS — I2510 Atherosclerotic heart disease of native coronary artery without angina pectoris: Secondary | ICD-10-CM | POA: Insufficient documentation

## 2022-06-20 DIAGNOSIS — Z7984 Long term (current) use of oral hypoglycemic drugs: Secondary | ICD-10-CM | POA: Insufficient documentation

## 2022-06-20 DIAGNOSIS — I5022 Chronic systolic (congestive) heart failure: Secondary | ICD-10-CM | POA: Diagnosis not present

## 2022-06-20 DIAGNOSIS — Z8674 Personal history of sudden cardiac arrest: Secondary | ICD-10-CM | POA: Insufficient documentation

## 2022-06-20 DIAGNOSIS — I110 Hypertensive heart disease with heart failure: Secondary | ICD-10-CM | POA: Insufficient documentation

## 2022-06-20 DIAGNOSIS — Z86718 Personal history of other venous thrombosis and embolism: Secondary | ICD-10-CM | POA: Insufficient documentation

## 2022-06-20 DIAGNOSIS — I472 Ventricular tachycardia, unspecified: Secondary | ICD-10-CM | POA: Diagnosis not present

## 2022-06-20 DIAGNOSIS — M459 Ankylosing spondylitis of unspecified sites in spine: Secondary | ICD-10-CM | POA: Insufficient documentation

## 2022-06-20 DIAGNOSIS — I252 Old myocardial infarction: Secondary | ICD-10-CM | POA: Insufficient documentation

## 2022-06-20 DIAGNOSIS — R7402 Elevation of levels of lactic acid dehydrogenase (LDH): Secondary | ICD-10-CM | POA: Insufficient documentation

## 2022-06-20 DIAGNOSIS — Z87891 Personal history of nicotine dependence: Secondary | ICD-10-CM | POA: Insufficient documentation

## 2022-06-20 DIAGNOSIS — E785 Hyperlipidemia, unspecified: Secondary | ICD-10-CM | POA: Insufficient documentation

## 2022-06-20 DIAGNOSIS — E8729 Other acidosis: Secondary | ICD-10-CM | POA: Insufficient documentation

## 2022-06-20 DIAGNOSIS — Z95810 Presence of automatic (implantable) cardiac defibrillator: Secondary | ICD-10-CM | POA: Insufficient documentation

## 2022-06-20 DIAGNOSIS — I255 Ischemic cardiomyopathy: Secondary | ICD-10-CM | POA: Insufficient documentation

## 2022-06-20 DIAGNOSIS — K222 Esophageal obstruction: Secondary | ICD-10-CM | POA: Insufficient documentation

## 2022-06-20 DIAGNOSIS — Z79899 Other long term (current) drug therapy: Secondary | ICD-10-CM | POA: Insufficient documentation

## 2022-06-20 DIAGNOSIS — R040 Epistaxis: Secondary | ICD-10-CM

## 2022-06-20 DIAGNOSIS — Z95811 Presence of heart assist device: Secondary | ICD-10-CM | POA: Insufficient documentation

## 2022-06-20 DIAGNOSIS — E119 Type 2 diabetes mellitus without complications: Secondary | ICD-10-CM | POA: Insufficient documentation

## 2022-06-20 DIAGNOSIS — Z955 Presence of coronary angioplasty implant and graft: Secondary | ICD-10-CM | POA: Insufficient documentation

## 2022-06-20 DIAGNOSIS — I447 Left bundle-branch block, unspecified: Secondary | ICD-10-CM | POA: Insufficient documentation

## 2022-06-20 DIAGNOSIS — Z7902 Long term (current) use of antithrombotics/antiplatelets: Secondary | ICD-10-CM | POA: Insufficient documentation

## 2022-06-20 LAB — PROTIME-INR
INR: 1.8 — ABNORMAL HIGH (ref 0.8–1.2)
Prothrombin Time: 20.5 seconds — ABNORMAL HIGH (ref 11.4–15.2)

## 2022-06-20 NOTE — Patient Instructions (Addendum)
Direct admit tomorrow for nasal artery embolization Friday Hold Coumadin tonight 2 month follow up 08/17/21 at 1000 VAD Coordinator will call if you need to drink Ensure tonight

## 2022-06-20 NOTE — Progress Notes (Signed)
Patient presents for 2 month follow up in [facility] today with wife. Reports no problems with VAD equipment or drive line.   Patient arrived ambulatory, no assistance required. He denies lightheadedness, dizziness, syncope, palpitations, ICD shocks, falls, and signs of bleeding. Reports baseline shortness of breath. Notes appetite is at baseline, and esophageal stricture symptoms improved. His only complaint is he is tired most of the time and ongoing nose bleeds.  Pt has had ongoing nosebleeds for the last 6 months requiring multiple ER trips and nasal packing. Most recently had Rhino Rocket placed in his left nostril 12/14. ENT Dr. Tami Ribas sent referral for embolization.   Decision made to direct admit patient tomorrow for Heparin bridge with nasal artery embolization scheduled at 8:30 am Friday in IR. Pt has been holding Plavix since 12/14 and told to hold Coumadin tonight pending admission. This VAD Coordinator called in direct admission with patient placement for tomorrow morning. [name] made aware.  Taking Amiodarone 100 mg daily. No changes today. Patient has f/u with EP in 9 months.  Per [name] pt's drive line has greatly improved and she is changing dressing weekly.   Vital Signs:  Temp:  Doppler Pressure: 90 Automatc BP: 94/71 (83) HR: 81 SPO2: 97% RA   Weight: 191 lbs w/ eqt Last weight: 192 lbs w/ ept   VAD Indication: Destination Therapy patient choice     VAD interrogation & Equipment Management: Speed: 9200 Flow: 4.6 Power:  5.2 w    PI: 4.9   Alarms: none Events: 0-10  Fixed speed 9200 Low speed limit: 8600   Primary controller: Replace back-up battery in 8 months Back up controller: Replace back-up battery in 12 months -- not present  Annual Equipment Maintenance on UBC/PM was performed on 08/30/2021.   I reviewed the LVAD parameters from today and compared the results to the patient's prior recorded data. LVAD interrogation was NEGATIVE for significant  power changes, NEGATIVE for clinical alarms and STABLE for PI events/speed drops. No programming changes were made and pump is functioning within specified parameters. Pt is performing daily controller and system monitor self tests along with completing weekly and monthly maintenance for LVAD equipment.   LVAD equipment check completed and is in good working order. Back-up equipment present.    Exit Site Care: Drive line is being maintained twice weekly or as needed by his wife using daily dressing kits. Dressing dry and intact. Stabilization device present and accurately applied. Pt denies fever or chills. Pt has adequate dressing supplies for home use.  Device: Medtronic BiV - Therapy: on 250 bpm -  Pacing:  DDD80 -  Last check:  11/15/21  BP & Labs:  MAP 90 - Doppler is reflecting modified systolic   Hgb 51.7 - No S/S of bleeding. Specifically denies melena/BRBPR or nosebleeds.    LDH 243 and is within established baseline of 250- 360. Pt denies tea-colored urine. No power elevations noted on interrogation. We will continue to check this with his INR.   Patient Instructions:  Direct admit tomorrow for nasal artery embolization Friday Hold Coumadin tonight 2 month follow up 08/17/21 at [place] RN,BSN [place] Coordinator  Office: [phone]  24/7 Pager: [phone]

## 2022-06-20 NOTE — H&P (Incomplete)
Advanced Heart Failure VAD History and Physical Note   PCP-Cardiologist: None   Reason for Admission: Epistaxis  HPI:     76 y/o male with h/o CAD with ankylosing spondylitis, DM2, carotid stenting, severe ischemic CM s/p anterior STEM with VT arrest in 12/15, large PE with pulmonary infarct in 2/15 s/p IVC filter and systolic HF with EF 67%.   He underwent HM II LVAD placement on 09/20/13. He had a prolonged hospital course due to recurrent chest bleeding, severe CO2 retention and RV failure.   In 8/18 admitted with acute gallstone pancreatitis. Underwent lap chole 02/21/17.    Admitted 2/18 with elevated LDH. He was started on bivalirudin. LDH went down [phone].    Admitted 10/30/21 with VT/VF with shock. Underwent emergent DC-CV. Required inotrope support with milrinone which was successfully weaned. Diuresed with IV lasix. Had AKI with recovery in renal function prior to discharge.    Readmitted 5/23 with recurrent VT. Given IV fluids. Underwent shock x1 complicated by CHB and hypotension. Developed several more episodes of VF while in hospital requiring DC-CV. Remained in CHB. EP consulted. Had CRT-D ICD placed with LBBB lead. D/c's on po amio   Seen in VAD clinic for f/u 12/19.  Has been struggling with severe epistaxis. Has had nose packed several times and seen ENT without resolution. Seen by [facility] in [facility] and offered nasal artery embolization. He remains frustrated at the situation. Scenario discussed with Neuro IR. He is being admitted today for nasal artery embolization on 12/22. Holding Warfarin.      LVAD INTERROGATION:  HeartMate II LVAD:  Flow *** liters/min, speed ***, power ***, PI ***.     Review of Systems: [y] = yes, '[ ]'$  = no   General: Weight gain '[ ]'$ ; Weight loss '[ ]'$ ; Anorexia '[ ]'$ ; Fatigue '[ ]'$ ; Fever '[ ]'$ ; Chills '[ ]'$ ; Weakness '[ ]'$   Cardiac: Chest pain/pressure '[ ]'$ ; Resting SOB '[ ]'$ ; Exertional SOB '[ ]'$ ; Orthopnea '[ ]'$ ; Pedal Edema '[ ]'$ ; Palpitations '[ ]'$ ; Syncope [  ]; Presyncope '[ ]'$ ; Paroxysmal nocturnal dyspnea'[ ]'$   Pulmonary: Cough '[ ]'$ ; Wheezing'[ ]'$ ; Hemoptysis'[ ]'$ ; Sputum '[ ]'$ ; Snoring '[ ]'$   GI: Vomiting'[ ]'$ ; Dysphagia'[ ]'$ ; Melena'[ ]'$ ; Hematochezia '[ ]'$ ; Heartburn'[ ]'$ ; Abdominal pain '[ ]'$ ; Constipation '[ ]'$ ; Diarrhea '[ ]'$ ; BRBPR '[ ]'$   GU: Hematuria'[ ]'$ ; Dysuria '[ ]'$ ; Nocturia'[ ]'$   Vascular: Pain in legs with walking '[ ]'$ ; Pain in feet with lying flat '[ ]'$ ; Non-healing sores '[ ]'$ ; Stroke '[ ]'$ ; TIA '[ ]'$ ; Slurred speech '[ ]'$ ;  Neuro: Headaches'[ ]'$ ; Vertigo'[ ]'$ ; Seizures'[ ]'$ ; Paresthesias'[ ]'$ ;Blurred vision '[ ]'$ ; Diplopia '[ ]'$ ; Vision changes '[ ]'$   Ortho/Skin: Arthritis '[ ]'$ ; Joint pain '[ ]'$ ; Muscle pain '[ ]'$ ; Joint swelling '[ ]'$ ; Back Pain '[ ]'$ ; Rash '[ ]'$   Psych: Depression'[ ]'$ ; Anxiety'[ ]'$   Heme: Bleeding problems '[ ]'$ ; Clotting disorders '[ ]'$ ; Anemia '[ ]'$   Endocrine: Diabetes '[ ]'$ ; Thyroid dysfunction'[ ]'$     Home Medications Prior to Admission medications   Medication Sig Start Date End Date Taking? Authorizing Provider  acetaminophen (TYLENOL) 500 MG tablet Take 1,000 mg by mouth every 6 (six) hours as needed for moderate pain.     [provider]  amiodarone (PACERONE) 200 MG tablet Take 0.5 tablets (100 mg total) by mouth daily. 02/23/22   Camnitz, Ocie Doyne, MD  cephALEXin (KEFLEX) 500 MG capsule Take 1 capsule (500 mg total) by mouth 2 (two) times daily. Patient taking differently:  Take 500 mg by mouth 3 (three) times daily. 11/21/21   Bensimhon, Shaune Pascal, MD  clobetasol ointment (TEMOVATE) 0.97 % Apply 1 application topically 2 (two) times daily. Patient taking differently: Apply 1 application  topically 2 (two) times daily. As needed 03/15/21   Bensimhon, Shaune Pascal, MD  clopidogrel (PLAVIX) 75 MG tablet Take 1 tablet (75 mg total) by mouth daily. 11/19/21   Clegg, Amy D, NP  cyclobenzaprine (FLEXERIL) 5 MG tablet Take 1 tablet (5 mg total) by mouth 3 (three) times daily as needed for muscle spasms. 06/30/20   Bensimhon, Shaune Pascal, MD  furosemide (LASIX) 20 MG tablet Take 2 tablets  (40 mg total) by mouth daily. Take 40 mg daily Patient taking differently: Take 20 mg by mouth daily. Take 40 mg daily 11/17/21   Clegg, Amy D, NP  pantoprazole (PROTONIX) 40 MG tablet TAKE 1 TABLET (40 MG TOTAL) BY MOUTH TWICE A DAY BEFORE MEALS Patient taking differently: 40 mg 2 (two) times daily. 02/19/22   Noralyn Pick, NP  potassium chloride SA (KLOR-CON M) 20 MEQ tablet Take 2 tablets (40 mEq total) by mouth daily. 20 mEq daily WITH furosemide (take 40 mEq if taking 40 mg of furosemide) 04/16/22   Bensimhon, Shaune Pascal, MD  rosuvastatin (CRESTOR) 10 MG tablet Take 1 tablet (10 mg total) by mouth every Monday, Wednesday, and Friday. 02/28/22   Bensimhon, Shaune Pascal, MD  traMADol (ULTRAM) 50 MG tablet Take 2 tablets (100 mg total) by mouth every 6 (six) hours as needed for moderate pain. Patient taking differently: Take 50 mg by mouth daily as needed for moderate pain. 08/30/20   Bensimhon, Shaune Pascal, MD  traZODone (DESYREL) 50 MG tablet Take 50 mg by mouth at bedtime.    [provider]  warfarin (COUMADIN) 6 MG tablet TAKE 1 TABLET BY MOUTH EVERY DAY Patient taking differently: Take 6 mg by mouth daily at 4 PM. TAKE 1 TABLET (6 mg) on FRIDAY, and 1/2 TABLET (3 mg) ALL OTHER DAYS BY MOUTH EVERY DAY OR AS DIRECTED BY HEART FAILURE CLINIC 12/14/21   Bensimhon, Shaune Pascal, MD    Past Medical History: Past Medical History:  Diagnosis Date   Acute on chronic respiratory failure ([place])    a. 08/2014 in setting of PE.   Ankylosing spondylitis ([place])    Bilateral pulmonary embolism ([place])    a. 08/2014 - started on Coumadin. Retrievable IVC filter placed 08/27/14 due to RV strain and large clot burden.   CAD (coronary artery disease)    a. stenting of LCx 2013; b. STEMI 06/12/14 s/p PCI to LAD complicated by post cath shock requiring IABP; VT s/p DCCV, EF 20%; c. NSTEMI 06/26/14 treated medically.   Carotid artery disease ([place])    a. s/p stenting.   Chronic systolic CHF (congestive heart  failure) ([place])    Diabetes ([place])    Hemoptysis    a. 08/2014 possibly due to pulm infarct/PE.   Hyperlipidemia    Hypertension    Hypotension    Ischemic cardiomyopathy    a. echo 08/23/2014 EF <20%, dilated CM, mod MR/TR   Leukocytosis    Leukocytosis 09/10/2014   Nephrolithiasis    Pleural effusion on right 08/2014 - small   Reactive thrombocytosis 09/10/2014   Right leg DVT ([place])    a. 08/2014.   Ventricular tachycardia ([place])    a. 06/2014 at time of MI, s/p DCCV.    Past Surgical History: Past Surgical History:  Procedure Laterality Date  BEDSIDE EVACUATION OF HEMATOMA  10/05/2014   Procedure: EVACUATION OF HEMATOMA;  Surgeon: Ivin Poot, MD;  Location: [place];  Service: Open Heart Surgery;;   CHOLECYSTECTOMY N/A 02/21/2017   Procedure: LAPAROSCOPIC CHOLECYSTECTOMY;  Surgeon: Rolm Bookbinder, MD;  Location: [place];  Service: General;  Laterality: N/A;   ICD IMPLANT N/A 11/14/2021   Procedure: ICD IMPLANT;  Surgeon: Constance Haw, MD;  Location: [facility];  Service: Cardiovascular;  Laterality: N/A;   INSERTION OF IMPLANTABLE LEFT VENTRICULAR ASSIST DEVICE N/A 09/21/2014   Procedure: INSERTION OF IMPLANTABLE LEFT VENTRICULAR ASSIST DEVICE;  Surgeon: Ivin Poot, MD;  Location: [place];  Service: Open Heart Surgery;  Laterality: N/A;  CIRC ARREST  NITRIC OXIDE   LEFT HEART CATHETERIZATION WITH CORONARY ANGIOGRAM N/A 06/12/2014   Procedure: LEFT HEART CATHETERIZATION WITH CORONARY ANGIOGRAM;  Surgeon: Lorretta Harp, MD;  Location[facility] CATH LAB;  Service: Cardiovascular;  Laterality: N/A;   RIGHT HEART CATHETERIZATION N/A 09/10/2014   Procedure: RIGHT HEART CATH;  Surgeon: [name], MD;  Location: [facility] CATH LAB;  Service: Cardiovascular;  Laterality: N/A;   RIGHT HEART CATHETERIZATION N/A 09/17/2014   Procedure: RIGHT HEART CATH;  Surgeon: [name], MD;  Location: [facility] CATH LAB;  Service: Cardiovascular;  Laterality: N/A;   TEE WITHOUT  CARDIOVERSION N/A 09/21/2014   Procedure: TRANSESOPHAGEAL ECHOCARDIOGRAM (TEE);  Surgeon: Ivin Poot, MD;  Location: [place];  Service: Open Heart Surgery;  Laterality: N/A;   TEE WITHOUT CARDIOVERSION N/A 10/05/2014   Procedure: TRANSESOPHAGEAL ECHOCARDIOGRAM (TEE);  Surgeon: Ivin Poot, MD;  Location: [place];  Service: Open Heart Surgery;  Laterality: N/A;   VIDEO ASSISTED THORACOSCOPY (VATS)/THOROCOTOMY Left 10/06/2014   Procedure: VIDEO ASSISTED THORACOSCOPY (VATS)/THOROCOTOMY;  Surgeon: Ivin Poot, MD;  Location: [place];  Service: Open Heart Surgery;  Laterality: Left;    Family History: Family History  Problem Relation Age of Onset   Hypertension Father    Heart attack Father    Heart Problems Sister    Colon cancer Neg Hx    Stomach cancer Neg Hx    Esophageal cancer Neg Hx    Colon polyps Neg Hx     Social History: Social History   Socioeconomic History   Marital status: Married    Spouse name: Not on file   Number of children: 0   Years of education: Not on file   Highest education level: Not on file  Occupational History   Occupation: retired  Tobacco Use   Smoking status: Former    Packs/day: 0.30    Years: 0.00    Total pack years: 0.00    Types: Cigarettes    Quit date: 06/12/1969    Years since quitting: 53.0   Smokeless tobacco: Never   Tobacco comments:    pt smoked while in [employer] 2-3 cig x 6 months.  Vaping Use   Vaping Use: Former  Substance and Sexual Activity   Alcohol use: No    Alcohol/week: 0.0 standard drinks of alcohol   Drug use: No   Sexual activity: Not Currently  Other Topics Concern   Not on file  Social History Narrative   Not on file   Social Determinants of Health   Financial Resource Strain: Not on file  Food Insecurity: Not on file  Transportation Needs: Not on file  Physical Activity: Not on file  Stress: Not on file  Social Connections: Not on file    Allergies:  Allergies  Allergen Reactions   Biopatch  [Chlorhexidine] Dermatitis and Rash   Desyrel [Trazodone] Other (See Comments)    insomnia   Other Dermatitis    Adhesive    Objective:    Vital Signs:   BP: ()/()  Arterial Line BP: ()/()    There were no vitals filed for this visit.  Mean arterial Pressure ***  Physical Exam    General:  Well appearing. No resp difficulty HEENT: Normal Neck: supple. JVP . Carotids 2+ bilat; no bruits. No lymphadenopathy or thyromegaly appreciated. Cor: Mechanical heart sounds with LVAD hum present. Lungs: Clear Abdomen: soft, nontender, nondistended. No hepatosplenomegaly. No bruits or masses. Good bowel sounds. Driveline: C/D/I; securement device intact and driveline incorporated Extremities: no cyanosis, clubbing, rash, edema Neuro: alert & orientedx3, cranial nerves grossly intact. moves all 4 extremities w/o difficulty. Affect pleasant   Telemetry   ***  EKG   ***  Labs    Basic Metabolic Panel: Recent Labs  Lab 06/18/22 0835  NA 138  K 4.4  CL 101  CO2 29  GLUCOSE 138*  BUN 17  CREATININE 1.09  CALCIUM 9.1    Liver Function Tests: No results for input(s): "AST", "ALT", "ALKPHOS", "BILITOT", "PROT", "ALBUMIN" in the last 168 hours. No results for input(s): "LIPASE", "AMYLASE" in the last 168 hours. No results for input(s): "AMMONIA" in the last 168 hours.  CBC: Recent Labs  Lab 06/14/22 0805 06/18/22 0835  WBC 11.9* 9.5  HGB 13.7 13.1  HCT 42.6 41.5  MCV 100.0 102.7*  PLT 281 243    Cardiac Enzymes: No results for input(s): "CKTOTAL", "CKMB", "CKMBINDEX", "TROPONINI" in the last 168 hours.  BNP: BNP (last 3 results) No results for input(s): "BNP" in the last 8760 hours.  ProBNP (last 3 results) No results for input(s): "PROBNP" in the last 8760 hours.   CBG: No results for input(s): "GLUCAP" in the last 168 hours.  Coagulation Studies: Recent Labs    06/18/22 0835 06/20/22 1404  LABPROT 23.1* 20.5*  INR 2.1* 1.8*     Imaging    No  results found.    Patient Profile:     Assessment/Plan:     1. Epistaxis, refractory - has failed management with nasal packing and ENT - case d/w Neuro IR. Going for nasal artery embolization on 12/22 - Hold warfarin.  - Will need INR < 2.0. Can give FFP as needed    2. Chronic systolic HF: Ischemic cardiomyopathy, EF 20% s/p HMII LVAD 09/20/13.   - Stable NYHA II - MAPs ok.  - RV failure under good control with daily lasix   3. Esophageal stricture - symptomatically improved with cutting food up - Barium swallow 7/23 suggestive of stricture at [place] + esophageal dysmotility - has seen GI. Discussed EGD but he wants to defer for now as symptoms improved - reminded him to be vigilant about cutting/chewing his food well  - No change   4. VT/VF c/b CHB - much improved with CRT-D with LBBB lead 5/23 - no further VT - Continue amio at 200 daily - keep K > 4.0. Mg > 2.0 - follow amio labs   5. CAD s/p Anterior STEMI on 06/12/14 with stenting of LAD:  - No s/s ischemia - ASA on hold with epistaxis. Resume later - Previously stopped statin but now back on crestor 3x/week. No change   6. DL infection, chronic - Site looks good  - Remains on suppressive Keflex - Continue weekly dressing changes  7. VAD management:  - Had admit in 08/2017 for elevated LDH.  - LDH stabilized 300-380 range.  - LDH *** - Off asa with recent epistaxis. On coumadin.  - Goal INR 2.0-2.5 INR ***  Management of AC as above - Continue Keflex as above for DL - VAD interrogated personally. Parameters stable.   8. Ankylosing spondylitis: Off NSAIDS. -  No longer following with rheumatology. Uses tramadol. Avoids NSAIDs as much as possible. -  No change   9. HTN:  - MAPs ok   10.  Kidney Stones  - CT with renal calculi bilaterally. Had some previous hematuria. None recently  Now followed by Dr Tresa Moore at [facility].     I reviewed the LVAD parameters from today, and compared the  results to the patient's prior recorded data.  No programming changes were made.  The LVAD is functioning within specified parameters.  The patient performs LVAD self-test daily.  LVAD interrogation was negative for any significant power changes, alarms or PI events/speed drops.  LVAD equipment check completed and is in good working order.  Back-up equipment present.   LVAD education done on emergency procedures and precautions and reviewed exit site care.  Length of Stay: 0  Anjenette Gerbino, [place] Parents, PA-C 06/20/2022, 4:11 PM  VAD Team Pager [phone] (7am - 7am) +++VAD ISSUES ONLY+++   Advanced Heart Failure Team Pager [phone] (M-F; [place])  Please contact [facility] for night-coverage after hours (5p -7a ) and weekends on [email or web address] for all non- LVAD Issues

## 2022-06-20 NOTE — Progress Notes (Signed)
Patient ID: Scott [name], male   DOB: 1945/11/12, 76 y.o.   MRN: [id number]    LVAD CLINIC NOTE  Primary Cardiologist: Dr Haroldine Laws INR : [facility]  HPI: Scott [name] is a 76 y/o male with h/o CAD with ankylosing spondylitis, DM2, carotid stenting, severe ischemic CM s/p anterior STEM with VT arrest in 12/15, large PE with pulmonary infarct in 2/15 s/p IVC filter and systolic HF with EF 76%.  He underwent HM II LVAD placement on 09/20/13. He had a prolonged hospital course due to recurrent chest bleeding, severe CO2 retention and RV failure.  In 8/18 admitted with acute gallstone pancreatitis. Underwent lap chole 02/21/17.   Admitted 2/18 with elevated LDH. He was started on bivalirudin. LDH went down [id number].   Admitted 10/30/21 with VT/VF with shock. Underwent emergent DC-CV. Required inotrope support with milrinone which was successfully weaned. Diuresed with IV lasix. Had AKI with recovery in renal function prior to discharge.   Readmitted 5/23 with recurrent VT. Given IV fluids. Underwent shock x1 complicated by CHB and hypotension. Developed several more episodes of VF while in hospital requiring DC-CV. Remained in CHB. EP consulted. Had CRT-D ICD placed with LBBB lead. D/c's on po amio   Here for f/u with [name]. Has been struggling with severe epistaxis. Has had nose packed several times and seen ENT without resolution. Seen by [facility] in [facility] yesterday and offered nasal artery embolization. He remains frustrated at the situation. Otherwise stable. Denies orthopnea or PND. No fevers, chills or problems with driveline. No melena or neuro symptoms. No VAD alarms. Taking all meds as prescribed.    VAD Indication: Destination Therapy patient choice     VAD interrogation & Equipment Management: Speed: 9200 Flow: 4.6 Power:  5.2 w    PI: 4.9   Alarms: none Events: 0-10  Fixed speed 9200 Low speed limit: 8600   Primary controller: Replace back-up battery in 8 months Back up  controller: Replace back-up battery in 12 months -- not present   Annual Equipment Maintenance on UBC/PM was performed on 08/30/2021.   I reviewed the LVAD parameters from today and compared the results to the patient's prior recorded data. LVAD interrogation was NEGATIVE for significant power changes, NEGATIVE for clinical alarms and STABLE for PI events/speed drops. No programming changes were made and pump is functioning within specified parameters. Pt is performing daily controller and system monitor self tests along with completing weekly and monthly maintenance for LVAD equipment.   LVAD equipment check completed and is in good working order. Back-up equipment present.    Exit Site Care: Drive line is being maintained twice weekly or as needed by his wife using daily dressing kits. Dressing dry and intact. Stabilization device present and accurately applied. Pt denies fever or chills. Pt provided with 4 x 4s, saline wipes, adhesive remover pads, and 5 anchors for home use.      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  Vaping Use   Vaping Use: Former  Substance and Sexual Activity   Alcohol use: No    Alcohol/week: 0.0 standard drinks of alcohol   Drug use: No   Sexual activity: Not Currently  Other Topics Concern   Not on file  Social History Narrative   Not on file   Social Determinants of Health   Financial Resource Strain: Not on file  Food Insecurity: Not on file  Transportation Needs: Not on  file  Physical Activity: Not on file  Stress: Not on file  Social Connections: Not on file  Intimate Partner Violence: Not on file    FH:  Family History  Problem Relation Age of Onset   Hypertension Father    Heart attack Father    Heart Problems Sister    Colon cancer Neg Hx    Stomach cancer Neg Hx    Esophageal cancer Neg Hx    Colon polyps Neg Hx     Past Medical History:  Diagnosis Date   Acute on chronic respiratory failure ([place])    a. 08/2014 in setting of PE.   Ankylosing spondylitis ([place])    Bilateral pulmonary embolism ([place])    a. 08/2014 - started on Coumadin. Retrievable IVC filter placed 08/27/14 due to RV strain and large clot burden.   CAD (coronary artery disease)    a. stenting of LCx 2013; b. STEMI 06/12/14 s/p PCI to LAD complicated by post cath shock requiring IABP; VT s/p DCCV, EF 20%; c. NSTEMI 06/26/14 treated medically.   Carotid artery disease ([place])    a. s/p stenting.   Chronic systolic CHF (congestive heart failure) ([place])    Diabetes ([place])    Hemoptysis    a. 08/2014 possibly due to pulm infarct/PE.   Hyperlipidemia    Hypertension    Hypotension    Ischemic cardiomyopathy    a. echo 08/23/2014 EF <20%, dilated CM, mod MR/TR   Leukocytosis    Leukocytosis 09/10/2014   Nephrolithiasis    Pleural effusion on right 08/2014 - small   Reactive thrombocytosis 09/10/2014   Right leg DVT ([place])    a. 08/2014.   Ventricular tachycardia ([place])    a. 06/2014 at time of MI, s/p DCCV.   Current Outpatient Medications  Medication Sig Dispense Refill   acetaminophen (TYLENOL) 500 MG tablet Take 1,000 mg by mouth every 6 (six) hours as needed for moderate pain.      amiodarone (PACERONE) 200 MG tablet Take 0.5 tablets (100 mg total) by mouth daily. 45 tablet 3   cephALEXin (KEFLEX) 500 MG capsule Take 1 capsule (500 mg total) by mouth 2 (two) times daily. (Patient taking differently: Take 500 mg by mouth 3 (three) times daily.) 270 capsule 3   clobetasol  ointment (TEMOVATE) 2.29 % Apply 1 application topically 2 (two) times daily. (Patient taking differently: Apply 1 application  topically 2 (two) times daily. As needed) 30 g 2   clopidogrel (PLAVIX) 75 MG tablet Take 1 tablet (75 mg total) by mouth daily. 90 tablet 3   cyclobenzaprine (FLEXERIL) 5 MG tablet Take 1 tablet (5 mg total) by mouth 3 (three) times daily as needed for muscle spasms. 90 tablet 5   furosemide (LASIX) 20 MG tablet Take 2 tablets (40 mg total) by mouth daily. Take 40 mg daily (Patient taking differently: Take 20 mg by mouth daily. Take 40 mg daily) 120 tablet 3   pantoprazole (PROTONIX)  40 MG tablet TAKE 1 TABLET (40 MG TOTAL) BY MOUTH TWICE A DAY BEFORE MEALS (Patient taking differently: 40 mg 2 (two) times daily.) 60 tablet 3   potassium chloride SA (KLOR-CON M) 20 MEQ tablet Take 2 tablets (40 mEq total) by mouth daily. 20 mEq daily WITH furosemide (take 40 mEq if taking 40 mg of furosemide) 180 tablet 0   rosuvastatin (CRESTOR) 10 MG tablet Take 1 tablet (10 mg total) by mouth every Monday, Wednesday, and Friday. 40 tablet 3   traMADol (ULTRAM) 50 MG tablet Take 2 tablets (100 mg total) by mouth every 6 (six) hours as needed for moderate pain. (Patient taking differently: Take 50 mg by mouth daily as needed for moderate pain.) 120 tablet 3   traZODone (DESYREL) 50 MG tablet Take 50 mg by mouth at bedtime.     warfarin (COUMADIN) 6 MG tablet TAKE 1 TABLET BY MOUTH EVERY DAY (Patient taking differently: Take 6 mg by mouth daily at 4 PM. TAKE 1 TABLET (6 mg) on FRIDAY, and 1/2 TABLET (3 mg) ALL OTHER DAYS BY MOUTH EVERY DAY OR AS DIRECTED BY HEART FAILURE CLINIC) 30 tablet 6   No current facility-administered medications for this encounter.     Vital Signs:  Temp:  Doppler Pressure: 90 Automatc BP: 94/71 (83) HR: 81 SPO2: 97% RA   Weight: 191 lbs w/ eqt Last weight: 192 lbs w/ ept       Weight  Wt Readings from Last 3 Encounters:  06/14/22 83.9 kg (184 lb 15.5 oz)   05/31/22 83.9 kg (185 lb)  04/16/22 87.1 kg (192 lb)   Physical Exam: General:  NAD.  HEENT: normal  Left nare packed Neck: supple. JVP not elevated.  Carotids 2+ bilat; no bruits. No lymphadenopathy or thryomegaly appreciated. Cor: LVAD hum.  Lungs: Clear. Abdomen: obese soft, nontender, non-distended. No hepatosplenomegaly. No bruits or masses. Good bowel sounds. Driveline site clean. Anchor in place.  Extremities: no cyanosis, clubbing, rash. Warm no edema  Neuro: alert & oriented x 3. No focal deficits. Moves all 4 without problem   ASSESSMENT & PLAN:  1. Epistaxis, refractory - has failed management with nasal packing and ENT - case d/w Neuro IR. Will admit tomorrow for nasal artery embolization on Friday - Hold warfarin.  - Will need INR < 2.0. Can give FFP as needed   2. Chronic systolic HF: Ischemic cardiomyopathy, EF 20% s/p HMII LVAD 09/20/13.   - Stable NYHA II - MAPs ok.  - RV failure under good control with daily lasix  3. Esophageal stricture - symptomatically improved with cutting food up - Barium swallow 7/23 suggestive of stricture at [place] + esophageal dysmotility - has seen GI. Discussed EGD but he wants to defer for now as symptoms improved - reminded him to be vigilant about cutting/chewing his food well  - No change  4. VT/VF c/b CHB - much improved with CRT-D with LBBB lead 5/23 - no further VT - Continue amio at 200 daily - keep K > 4.0. Mg > 2.0 - follow amio labs  5. CAD s/p Anterior STEMI on 06/12/14 with stenting of LAD:  - No s/s ischemia - ASA on hold with epistaxis. Resume later - Previously stopped statin but now back on crestor 3x/week. No change  6. DL infection, chronic - Site looks good  - Remains on suppressive Keflex - Continue weekly dressing changes  7. VAD management:  - Had admit in 08/2017 for elevated LDH.  -  LDH stabilized 300-380 range.  - LDH 243 - Off asa with recent epistaxis. . On coumadin.  - Goal INR  2.0-2.5 INR 2.1  Management of AC as above - Continue Keflex as above for DL - VAD interrogated personally. Parameters stable.  8. Ankylosing spondylitis: Off NSAIDS. -  No longer following with rheumatology. Uses tramadol. Avoids NSAIDs as much as possible. -  No change  9. HTN:  - MAPs ok  10.  Kidney Stones  - CT with renal calculi bilaterally. Had some previous hematuria. None recently  Now followed by Dr Tresa Moore at [facility].  Total time spent 45 minutes. Over half that time spent discussing above.   [name], MD  06/20/2022

## 2022-06-21 ENCOUNTER — Other Ambulatory Visit: Payer: Self-pay

## 2022-06-21 ENCOUNTER — Other Ambulatory Visit (HOSPITAL_COMMUNITY): Payer: Self-pay

## 2022-06-21 ENCOUNTER — Encounter (INDEPENDENT_AMBULATORY_CARE_PROVIDER_SITE_OTHER): Payer: Medicare Other | Admitting: Nurse Practitioner

## 2022-06-21 ENCOUNTER — Other Ambulatory Visit (HOSPITAL_COMMUNITY): Payer: Self-pay | Admitting: Student

## 2022-06-21 ENCOUNTER — Encounter (HOSPITAL_COMMUNITY): Payer: Self-pay | Admitting: Internal Medicine

## 2022-06-21 ENCOUNTER — Inpatient Hospital Stay (HOSPITAL_COMMUNITY)
Admission: AD | Admit: 2022-06-21 | Discharge: 2022-06-27 | DRG: 140 | Disposition: A | Discharge status: Home / Self Care | Payer: Medicare Other | Source: Ambulatory Visit | Attending: Internal Medicine | Admitting: Internal Medicine

## 2022-06-21 DIAGNOSIS — R0609 Other forms of dyspnea: Secondary | ICD-10-CM

## 2022-06-21 DIAGNOSIS — K219 Gastro-esophageal reflux disease without esophagitis: Secondary | ICD-10-CM | POA: Diagnosis present

## 2022-06-21 DIAGNOSIS — I255 Ischemic cardiomyopathy: Secondary | ICD-10-CM | POA: Diagnosis present

## 2022-06-21 DIAGNOSIS — I6529 Occlusion and stenosis of unspecified carotid artery: Secondary | ICD-10-CM | POA: Diagnosis not present

## 2022-06-21 DIAGNOSIS — I509 Heart failure, unspecified: Secondary | ICD-10-CM | POA: Diagnosis not present

## 2022-06-21 DIAGNOSIS — I252 Old myocardial infarction: Secondary | ICD-10-CM | POA: Diagnosis not present

## 2022-06-21 DIAGNOSIS — I5022 Chronic systolic (congestive) heart failure: Secondary | ICD-10-CM | POA: Diagnosis not present

## 2022-06-21 DIAGNOSIS — Z86718 Personal history of other venous thrombosis and embolism: Secondary | ICD-10-CM | POA: Diagnosis not present

## 2022-06-21 DIAGNOSIS — I442 Atrioventricular block, complete: Secondary | ICD-10-CM | POA: Diagnosis present

## 2022-06-21 DIAGNOSIS — Z7901 Long term (current) use of anticoagulants: Secondary | ICD-10-CM

## 2022-06-21 DIAGNOSIS — E119 Type 2 diabetes mellitus without complications: Secondary | ICD-10-CM | POA: Diagnosis not present

## 2022-06-21 DIAGNOSIS — I5023 Acute on chronic systolic (congestive) heart failure: Secondary | ICD-10-CM

## 2022-06-21 DIAGNOSIS — E785 Hyperlipidemia, unspecified: Secondary | ICD-10-CM | POA: Diagnosis present

## 2022-06-21 DIAGNOSIS — K222 Esophageal obstruction: Secondary | ICD-10-CM | POA: Diagnosis present

## 2022-06-21 DIAGNOSIS — I2510 Atherosclerotic heart disease of native coronary artery without angina pectoris: Secondary | ICD-10-CM | POA: Diagnosis present

## 2022-06-21 DIAGNOSIS — Z8249 Family history of ischemic heart disease and other diseases of the circulatory system: Secondary | ICD-10-CM | POA: Diagnosis not present

## 2022-06-21 DIAGNOSIS — I110 Hypertensive heart disease with heart failure: Secondary | ICD-10-CM | POA: Diagnosis not present

## 2022-06-21 DIAGNOSIS — I952 Hypotension due to drugs: Secondary | ICD-10-CM | POA: Diagnosis not present

## 2022-06-21 DIAGNOSIS — I420 Dilated cardiomyopathy: Secondary | ICD-10-CM | POA: Diagnosis not present

## 2022-06-21 DIAGNOSIS — I480 Paroxysmal atrial fibrillation: Secondary | ICD-10-CM

## 2022-06-21 DIAGNOSIS — N200 Calculus of kidney: Secondary | ICD-10-CM | POA: Diagnosis present

## 2022-06-21 DIAGNOSIS — E669 Obesity, unspecified: Secondary | ICD-10-CM | POA: Diagnosis not present

## 2022-06-21 DIAGNOSIS — T4275XA Adverse effect of unspecified antiepileptic and sedative-hypnotic drugs, initial encounter: Secondary | ICD-10-CM | POA: Diagnosis not present

## 2022-06-21 DIAGNOSIS — Z95811 Presence of heart assist device: Secondary | ICD-10-CM

## 2022-06-21 DIAGNOSIS — M459 Ankylosing spondylitis of unspecified sites in spine: Secondary | ICD-10-CM | POA: Diagnosis present

## 2022-06-21 DIAGNOSIS — G479 Sleep disorder, unspecified: Secondary | ICD-10-CM

## 2022-06-21 DIAGNOSIS — Z87891 Personal history of nicotine dependence: Secondary | ICD-10-CM

## 2022-06-21 DIAGNOSIS — Z792 Long term (current) use of antibiotics: Secondary | ICD-10-CM

## 2022-06-21 DIAGNOSIS — Z95810 Presence of automatic (implantable) cardiac defibrillator: Secondary | ICD-10-CM | POA: Diagnosis not present

## 2022-06-21 DIAGNOSIS — Z86711 Personal history of pulmonary embolism: Secondary | ICD-10-CM

## 2022-06-21 DIAGNOSIS — I472 Ventricular tachycardia, unspecified: Secondary | ICD-10-CM | POA: Diagnosis present

## 2022-06-21 DIAGNOSIS — Z8674 Personal history of sudden cardiac arrest: Secondary | ICD-10-CM

## 2022-06-21 DIAGNOSIS — R040 Epistaxis: Secondary | ICD-10-CM

## 2022-06-21 DIAGNOSIS — Z8679 Personal history of other diseases of the circulatory system: Secondary | ICD-10-CM | POA: Diagnosis not present

## 2022-06-21 DIAGNOSIS — I5041 Acute combined systolic (congestive) and diastolic (congestive) heart failure: Secondary | ICD-10-CM

## 2022-06-21 DIAGNOSIS — I4901 Ventricular fibrillation: Secondary | ICD-10-CM | POA: Diagnosis present

## 2022-06-21 DIAGNOSIS — Z9861 Coronary angioplasty status: Secondary | ICD-10-CM

## 2022-06-21 DIAGNOSIS — Z6827 Body mass index (BMI) 27.0-27.9, adult: Secondary | ICD-10-CM

## 2022-06-21 DIAGNOSIS — T827XXA Infection and inflammatory reaction due to other cardiac and vascular devices, implants and grafts, initial encounter: Secondary | ICD-10-CM

## 2022-06-21 DIAGNOSIS — Z888 Allergy status to other drugs, medicaments and biological substances status: Secondary | ICD-10-CM

## 2022-06-21 DIAGNOSIS — H579 Unspecified disorder of eye and adnexa: Secondary | ICD-10-CM | POA: Diagnosis present

## 2022-06-21 DIAGNOSIS — Z7902 Long term (current) use of antithrombotics/antiplatelets: Secondary | ICD-10-CM

## 2022-06-21 DIAGNOSIS — T827XXD Infection and inflammatory reaction due to other cardiac and vascular devices, implants and grafts, subsequent encounter: Secondary | ICD-10-CM

## 2022-06-21 DIAGNOSIS — Z79899 Other long term (current) drug therapy: Secondary | ICD-10-CM

## 2022-06-21 LAB — CBC
HCT: 35.9 % — ABNORMAL LOW (ref 39.0–52.0)
Hemoglobin: 11.9 g/dL — ABNORMAL LOW (ref 13.0–17.0)
MCH: 33 pg (ref 26.0–34.0)
MCHC: 33.1 g/dL (ref 30.0–36.0)
MCV: 99.4 fL (ref 80.0–100.0)
Platelets: 205 10*3/uL (ref 150–400)
RBC: 3.61 MIL/uL — ABNORMAL LOW (ref 4.22–5.81)
RDW: 15.8 % — ABNORMAL HIGH (ref 11.5–15.5)
WBC: 9.7 10*3/uL (ref 4.0–10.5)
nRBC: 0 % (ref 0.0–0.2)

## 2022-06-21 LAB — DIFFERENTIAL
Abs Immature Granulocytes: 0 10*3/uL (ref 0.00–0.07)
Band Neutrophils: 0 %
Basophils Absolute: 0 10*3/uL (ref 0.0–0.1)
Basophils Relative: 0 %
Blasts: 0 %
Eosinophils Absolute: 0.1 10*3/uL (ref 0.0–0.5)
Eosinophils Relative: 1 %
Lymphocytes Relative: 5 %
Lymphs Abs: 0.5 10*3/uL — ABNORMAL LOW (ref 0.7–4.0)
Metamyelocytes Relative: 0 %
Monocytes Absolute: 1.4 10*3/uL — ABNORMAL HIGH (ref 0.1–1.0)
Monocytes Relative: 14 %
Myelocytes: 0 %
Neutro Abs: 7.7 10*3/uL (ref 1.7–7.7)
Neutrophils Relative %: 80 %
Other: 0 %
Promyelocytes Relative: 0 %
nRBC: 0 /100 WBC

## 2022-06-21 LAB — PROTIME-INR
INR: 1.6 — ABNORMAL HIGH (ref 0.8–1.2)
Prothrombin Time: 18.7 seconds — ABNORMAL HIGH (ref 11.4–15.2)

## 2022-06-21 LAB — COMPREHENSIVE METABOLIC PANEL
ALT: 17 U/L (ref 0–44)
AST: 28 U/L (ref 15–41)
Albumin: 3.3 g/dL — ABNORMAL LOW (ref 3.5–5.0)
Alkaline Phosphatase: 151 U/L — ABNORMAL HIGH (ref 38–126)
Anion gap: 7 (ref 5–15)
BUN: 15 mg/dL (ref 8–23)
CO2: 25 mmol/L (ref 22–32)
Calcium: 9 mg/dL (ref 8.9–10.3)
Chloride: 105 mmol/L (ref 98–111)
Creatinine, Ser: 1.07 mg/dL (ref 0.61–1.24)
GFR, Estimated: >60 mL/min (ref >60)
Glucose, Bld: 121 mg/dL — ABNORMAL HIGH (ref 70–99)
Potassium: 4.1 mmol/L (ref 3.5–5.1)
Sodium: 137 mmol/L (ref 135–145)
Total Bilirubin: 1.4 mg/dL — ABNORMAL HIGH (ref 0.3–1.2)
Total Protein: 7 g/dL (ref 6.5–8.1)

## 2022-06-21 LAB — HEPARIN LEVEL (UNFRACTIONATED): Heparin Unfractionated: <0.1 IU/mL — ABNORMAL LOW (ref 0.30–0.70)

## 2022-06-21 LAB — LACTATE DEHYDROGENASE: LDH: 246 U/L — ABNORMAL HIGH (ref 98–192)

## 2022-06-21 LAB — MRSA NEXT GEN BY PCR, NASAL: MRSA by PCR Next Gen: NOT DETECTED

## 2022-06-21 MED ORDER — ONDANSETRON HCL 4 MG/2ML IJ SOLN: 4.0000 mg | Freq: Four times a day (QID) | INTRAMUSCULAR | Status: DC | PRN

## 2022-06-21 MED ORDER — FUROSEMIDE 20 MG PO TABS: 20.0000 mg | ORAL_TABLET | Freq: Every day | ORAL | Status: DC

## 2022-06-21 MED ORDER — ORAL CARE MOUTH RINSE: 15.0000 mL | OROMUCOSAL | Status: DC | PRN

## 2022-06-21 MED ORDER — PANTOPRAZOLE SODIUM 40 MG PO TBEC
40.0000 mg | DELAYED_RELEASE_TABLET | Freq: Every day | ORAL | Status: DC
  Administered 2022-06-23 – 2022-06-27 (×5): 40 mg via ORAL
  Filled 2022-06-21 (×5): qty 1

## 2022-06-21 MED ORDER — ROSUVASTATIN CALCIUM 5 MG PO TABS
10.0000 mg | ORAL_TABLET | ORAL | Status: DC
  Administered 2022-06-25 – 2022-06-27 (×2): 10 mg via ORAL
  Filled 2022-06-21 (×2): qty 2

## 2022-06-21 MED ORDER — ACETAMINOPHEN 500 MG PO TABS: 1000.0000 mg | ORAL_TABLET | Freq: Four times a day (QID) | ORAL | Status: DC | PRN

## 2022-06-21 MED ORDER — HEPARIN (PORCINE) 25000 UT/250ML-% IV SOLN
800.0000 [IU]/h | INTRAVENOUS | Status: DC
  Administered 2022-06-21: 800 [IU]/h via INTRAVENOUS
  Filled 2022-06-21: qty 250

## 2022-06-21 MED ORDER — CEPHALEXIN 500 MG PO CAPS
500.0000 mg | ORAL_CAPSULE | Freq: Three times a day (TID) | ORAL | Status: DC
  Administered 2022-06-21 – 2022-06-27 (×14): 500 mg via ORAL
  Filled 2022-06-21 (×22): qty 1

## 2022-06-21 MED ORDER — POTASSIUM CHLORIDE CRYS ER 20 MEQ PO TBCR
20.0000 meq | EXTENDED_RELEASE_TABLET | Freq: Every day | ORAL | Status: DC
  Administered 2022-06-23 – 2022-06-24 (×2): 20 meq via ORAL
  Filled 2022-06-21 (×2): qty 1

## 2022-06-21 MED ORDER — AMIODARONE HCL 100 MG PO TABS
100.0000 mg | ORAL_TABLET | Freq: Every day | ORAL | Status: DC
  Administered 2022-06-23 – 2022-06-27 (×5): 100 mg via ORAL
  Filled 2022-06-21 (×5): qty 1

## 2022-06-21 MED ORDER — CYCLOBENZAPRINE HCL 10 MG PO TABS
5.0000 mg | ORAL_TABLET | Freq: Three times a day (TID) | ORAL | Status: DC | PRN
  Administered 2022-06-23: 5 mg via ORAL
  Filled 2022-06-21: qty 1

## 2022-06-21 MED ORDER — TRAZODONE HCL 50 MG PO TABS: 50.0000 mg | ORAL_TABLET | Freq: Every evening | ORAL | Status: DC | PRN

## 2022-06-21 MED ORDER — POTASSIUM CHLORIDE 20 MEQ PO PACK: 20.0000 meq | PACK | Freq: Every day | ORAL | Status: DC

## 2022-06-21 MED ORDER — TRAMADOL HCL 50 MG PO TABS
100.0000 mg | ORAL_TABLET | Freq: Four times a day (QID) | ORAL | Status: DC | PRN
  Administered 2022-06-22: 100 mg via ORAL

## 2022-06-21 MED ORDER — SODIUM CHLORIDE 0.9 % IV SOLN: INTRAVENOUS | Status: DC

## 2022-06-21 NOTE — Progress Notes (Addendum)
ANTICOAGULATION CONSULT NOTE - Initial Consult  Pharmacy Consult for heparin Indication:  LVAD, history of PE  Allergies  Allergen Reactions   Biopatch [Chlorhexidine] Dermatitis and Rash   Desyrel [Trazodone] Other (See Comments)    insomnia   Other Dermatitis    Adhesive    Patient Measurements: Height: '5\' 10"'$  (177.8 cm) Weight: 83.6 kg (184 lb 4.9 oz) IBW/kg (Calculated) : 73 Heparin Dosing Weight: 84kg  Vital Signs: Temp: 97.5 F (36.4 C) (12/21 1145) Temp Source: Oral (12/21 1145) BP: 97/77 (12/21 1116) Pulse Rate: 80 (12/21 1145)  Labs: Recent Labs    06/20/22 1404  LABPROT 20.5*  INR 1.8*    Estimated Creatinine Clearance: 61.4 mL/min (by C-G formula based on SCr of 1.09 mg/dL).   Medical History: Past Medical History:  Diagnosis Date   Acute on chronic respiratory failure ([place])    a. 08/2014 in setting of PE.   Ankylosing spondylitis ([place])    Bilateral pulmonary embolism ([place])    a. 08/2014 - started on Coumadin. Retrievable IVC filter placed 08/27/14 due to RV strain and large clot burden.   CAD (coronary artery disease)    a. stenting of LCx 2013; b. STEMI 06/12/14 s/p PCI to LAD complicated by post cath shock requiring IABP; VT s/p DCCV, EF 20%; c. NSTEMI 06/26/14 treated medically.   Carotid artery disease ([place])    a. s/p stenting.   Chronic systolic CHF (congestive heart failure) ([place])    Diabetes ([place])    Hemoptysis    a. 08/2014 possibly due to pulm infarct/PE.   Hyperlipidemia    Hypertension    Hypotension    Ischemic cardiomyopathy    a. echo 08/23/2014 EF <20%, dilated CM, mod MR/TR   Leukocytosis    Leukocytosis 09/10/2014   Nephrolithiasis    Pleural effusion on right 08/2014 - small   Reactive thrombocytosis 09/10/2014   Right leg DVT ([place])    a. 08/2014.   Ventricular tachycardia ([place])    a. 06/2014 at time of MI, s/p DCCV.    Assessment: 76 year old male with HM2 LVAD presents to [facility] as a direct admit for heparin bridge prior to  nasal artery embolization tomorrow 12/22.   INR was 1.8 on 12/20 and now down to 1.6 today. Warfarin has been on hold for the past couple days. Plavix and aspirin has also been held for his recurrent nose bleeds. Patient has nose packing in place. CBC was normal on 12/18, no overt bleeding noted today.   Goal of Therapy:  Heparin level > 0.3 Monitor platelets by anticoagulation protocol: Yes   Plan:  Discussed with HF team, will start heparin at fixed dose of 800 units/hr with no plans for uptitration unless levels are elevated. . Check heparin level this evening then daily  Erin Hearing PharmD., BCPS Clinical Pharmacist 06/21/2022 11:54 AM

## 2022-06-21 NOTE — TOC Initial Note (Signed)
Transition of Care [facility]) - Initial/Assessment Note    Patient Details  Name: Scott [name] MRN: [id number] Date of Birth: 21-Nov-1945  Transition of [facility]) CM/SW Contact:    Erenest Rasher, RN Phone Number: [phone] 06/21/2022, 4:12 PM  Clinical Narrative:                 HF TOC CM spoke to pt and wife at bedside. Wife declined HH. Pt has scale at home for daily weights. Will continue to follow for dc needs.   Planned Disposition: Home Barriers to Discharge: Continued Medical Work up   Patient Goals and CMS Choice Patient states their goals for this hospitalization and ongoing recovery are:: wants to recover          Expected Discharge Plan and Services Planned Disposition: Home   Discharge Planning Services: CM Consult   Living arrangements for the past 2 months: Single Family Home                                      Prior Living Arrangements/Services Living arrangements for the past 2 months: Single Family Home Lives with:: Spouse Patient language and need for interpreter reviewed:: Yes Do you feel safe going back to the place where you live?: Yes      Need for Family Participation in Patient Care: No (Comment) Care giver support system in place?: Yes (comment) Current home services: DME (LVAD, rolling walker) Criminal Activity/Legal Involvement Pertinent to Current Situation/Hospitalization: No - Comment as needed  Activities of Daily Living Home Assistive Devices/Equipment: Other (Comment), Walker (specify type), Grab bars in shower, Blood pressure cuff, Bedside commode/3-in-1 ADL Screening (condition at time of admission) Patient's cognitive ability adequate to safely complete daily activities?: Yes Is the patient deaf or have difficulty hearing?: No Does the patient have difficulty seeing, even when wearing glasses/contacts?: No Does the patient have difficulty concentrating, remembering, or making decisions?: No Patient able to  express need for assistance with ADLs?: Yes Does the patient have difficulty dressing or bathing?: No Independently performs ADLs?: Yes (appropriate for developmental age) Does the patient have difficulty walking or climbing stairs?: No Weakness of Legs: None Weakness of Arms/Hands: None  Permission Sought/Granted Permission sought to share information with : Case Manager, Family Supports, PCP Permission granted to share information with : Yes, Verbal Permission Granted  Share Information with NAME: Kari Kerth     Permission granted to share info w Relationship: wife  Permission granted to share info w Contact Information: [phone]  Emotional Assessment Appearance:: Appears stated age Attitude/Demeanor/Rapport: Engaged Affect (typically observed): Accepting Orientation: : Oriented to Self, Oriented to Place, Oriented to  Time, Oriented to Situation   Psych Involvement: No (comment)  Admission diagnosis:  Epistaxis [R04.0] Patient Active Problem List   Diagnosis Date Noted   Epistaxis 06/19/2022   Hyperlipidemia 06/19/2022   Hyperkalemia 11/10/2021   Supratherapeutic INR 11/10/2021   V-tach ([place]) 10/30/2021   Ventricular tachycardia ([place]) 10/30/2021   Elevated LDH    Fungal rash of trunk 03/17/2015   Right femoral vein DVT ([place]) 01/10/2015   Chronic anticoagulation 12/02/2014   Anxiety state 10/11/2014   Sleep disturbance 10/11/2014   LVAD (left ventricular assist device) present ([place])    PAF (paroxysmal atrial fibrillation) (HCC)    CHF (congestive heart failure) ([place]) 09/21/2014   Palliative care encounter 09/16/2014   Weakness generalized 09/16/2014   Encounter for  therapeutic drug monitoring 09/08/2014   Cough with hemoptysis    Hypoxia    PE (pulmonary embolism) 08/26/2014   Pulmonary embolus ([place]) 08/26/2014   CAD in native artery 11/57/2620   Chronic systolic heart failure ([place]) 06/22/2014   Abdominal pain, acute    STEMI (ST elevation myocardial  infarction) ([place]) 06/12/2014   VT (ventricular tachycardia) (HCC)    ST elevation myocardial infarction involving left anterior descending (LAD) coronary artery ([place])    PCP:  Bensimhon, Shaune Pascal, MD Pharmacy:   [id number] [place] [address]Phone: [phone]: [phone] [facility] [place] [facility] [address]: [phone]: [address] [place]: [phone]: [phone]    Social Determinants of Health (SDOH) Social History: [place] No Food Insecurity (06/21/2022)  Housing: Low Risk  (06/21/2022)  Transportation Needs: No Transportation Needs (06/21/2022)  Utilities: Not At Risk (06/21/2022)  Tobacco Use: Medium Risk (06/21/2022)   SDOH Interventions:     Readmission Risk Interventions     No data to display

## 2022-06-21 NOTE — H&P (Signed)
Chief Complaint: Patient was seen in consultation today for epistaxis; nasal artery embolization.   Referring Physician(s): Bensimhon,Ridge R  Supervising Physician: Luanne Bras  Patient Status: [facility] - In-pt  History of Present Illness: Scott [name] is a 76 y.o. male with a medical history significant for CAD with ankylosing spondylitis, DM2, carotid stenting, severe ischemic cardiomyopathy s/p anterior STEMI with VT arrest, large PE with pulmonary infarct s/p IVC filter placement (chronic Coumadin and Plavix) and CHF with EF 20%.   He underwent HM II LVAD placement 09/20/13. He had several hospital admissions earlier this year for VT/VF and is now s/p CRT-D ICD placement.   He was seen in the LVAD clinic 06/20/22 and he complained of severe epistaxis that had been going on for multiple weeks. He has had his nose packed several times. He was evaluated by ENT without resolution. Interventional Radiology has been asked to evaluate this patient for an image-guided nasal artery embolization. Case reviewed and procedure approved by Dr. Estanislado Pandy.  The patient was admitted to the hospital 06/21/22 with plans to perform the procedure 06/22/22 under general anesthesia.   Past Medical History:  Diagnosis Date   Acute on chronic respiratory failure ([place])    a. 08/2014 in setting of PE.   Ankylosing spondylitis ([place])    Bilateral pulmonary embolism ([place])    a. 08/2014 - started on Coumadin. Retrievable IVC filter placed 08/27/14 due to RV strain and large clot burden.   CAD (coronary artery disease)    a. stenting of LCx 2013; b. STEMI 06/12/14 s/p PCI to LAD complicated by post cath shock requiring IABP; VT s/p DCCV, EF 20%; c. NSTEMI 06/26/14 treated medically.   Carotid artery disease ([place])    a. s/p stenting.   Chronic systolic CHF (congestive heart failure) ([place])    Diabetes ([place])    Hemoptysis    a. 08/2014 possibly due to pulm infarct/PE.   Hyperlipidemia    Hypertension     Hypotension    Ischemic cardiomyopathy    a. echo 08/23/2014 EF <20%, dilated CM, mod MR/TR   Leukocytosis    Leukocytosis 09/10/2014   Nephrolithiasis    Pleural effusion on right 08/2014 - small   Reactive thrombocytosis 09/10/2014   Right leg DVT ([place])    a. 08/2014.   Ventricular tachycardia ([place])    a. 06/2014 at time of MI, s/p DCCV.    Past Surgical History:  Procedure Laterality Date   BEDSIDE EVACUATION OF HEMATOMA  10/05/2014   Procedure: EVACUATION OF HEMATOMA;  Surgeon: Ivin Poot, MD;  Location: [place];  Service: Open Heart Surgery;;   CHOLECYSTECTOMY N/A 02/21/2017   Procedure: LAPAROSCOPIC CHOLECYSTECTOMY;  Surgeon: Rolm Bookbinder, MD;  Location: [place];  Service: General;  Laterality: N/A;   ICD IMPLANT N/A 11/14/2021   Procedure: ICD IMPLANT;  Surgeon: Constance Haw, MD;  Location: [place] CV LAB;  Service: Cardiovascular;  Laterality: N/A;   INSERTION OF IMPLANTABLE LEFT VENTRICULAR ASSIST DEVICE N/A 09/21/2014   Procedure: INSERTION OF IMPLANTABLE LEFT VENTRICULAR ASSIST DEVICE;  Surgeon: Ivin Poot, MD;  Location: [place];  Service: Open Heart Surgery;  Laterality: N/A;  CIRC ARREST  NITRIC OXIDE   LEFT HEART CATHETERIZATION WITH CORONARY ANGIOGRAM N/A 06/12/2014   Procedure: LEFT HEART CATHETERIZATION WITH CORONARY ANGIOGRAM;  Surgeon: Lorretta Harp, MD;  Location: [facility] CATH LAB;  Service: Cardiovascular;  Laterality: N/A;   RIGHT HEART CATHETERIZATION N/A 09/10/2014   Procedure: RIGHT HEART CATH;  Surgeon: Quillian Quince  R Bensimhon, MD;  Location: [place] CATH LAB;  Service: Cardiovascular;  Laterality: N/A;   RIGHT HEART CATHETERIZATION N/A 09/17/2014   Procedure: RIGHT HEART CATH;  Surgeon: [name], MD;  Location: [facility] CATH LAB;  Service: Cardiovascular;  Laterality: N/A;   TEE WITHOUT CARDIOVERSION N/A 09/21/2014   Procedure: TRANSESOPHAGEAL ECHOCARDIOGRAM (TEE);  Surgeon: Ivin Poot, MD;  Location: [place];  Service: Open Heart Surgery;   Laterality: N/A;   TEE WITHOUT CARDIOVERSION N/A 10/05/2014   Procedure: TRANSESOPHAGEAL ECHOCARDIOGRAM (TEE);  Surgeon: Ivin Poot, MD;  Location: [place];  Service: Open Heart Surgery;  Laterality: N/A;   VIDEO ASSISTED THORACOSCOPY (VATS)/THOROCOTOMY Left 10/06/2014   Procedure: VIDEO ASSISTED THORACOSCOPY (VATS)/THOROCOTOMY;  Surgeon: Ivin Poot, MD;  Location: [place];  Service: Open Heart Surgery;  Laterality: Left;    Allergies: Biopatch [chlorhexidine], Desyrel [trazodone], and Other  Medications: Prior to Admission medications   Medication Sig Start Date End Date Taking? Authorizing Provider  amiodarone (PACERONE) 200 MG tablet Take 0.5 tablets (100 mg total) by mouth daily. 02/23/22  Yes Camnitz, Will Hassell Done, MD  cephALEXin (KEFLEX) 500 MG capsule Take 1 capsule (500 mg total) by mouth 2 (two) times daily. Patient taking differently: Take 500 mg by mouth 3 (three) times daily. 11/21/21  Yes Bensimhon, Shaune Pascal, MD  clobetasol ointment (TEMOVATE) 5.63 % Apply 1 application topically 2 (two) times daily. Patient taking differently: Apply 1 application  topically 2 (two) times daily as needed (skin irritation/dermatitis (around dressing area)). 03/15/21  Yes Bensimhon, Shaune Pascal, MD  clopidogrel (PLAVIX) 75 MG tablet Take 1 tablet (75 mg total) by mouth daily. 11/19/21  Yes Clegg, Amy D, NP  furosemide (LASIX) 20 MG tablet Take 2 tablets (40 mg total) by mouth daily. Take 40 mg daily Patient taking differently: Take 20 mg by mouth daily. Take 1 tablet (20 mg) by mouth alternating with taking 2 tablets (40 mg) by mouth every other day cyclically. 11/17/21  Yes Clegg, Amy D, NP  pantoprazole (PROTONIX) 40 MG tablet TAKE 1 TABLET (40 MG TOTAL) BY MOUTH TWICE A DAY BEFORE MEALS Patient taking differently: 40 mg daily before supper. 02/19/22  Yes Noralyn Pick, NP  potassium chloride SA (KLOR-CON M) 20 MEQ tablet Take 2 tablets (40 mEq total) by mouth daily. 20 mEq daily WITH  furosemide (take 40 mEq if taking 40 mg of furosemide) Patient taking differently: Take 20 mEq by mouth daily. Take 1 tablet (20 meq) by mouth alternating with taking 2 tablets (40 meq) by mouth every other day cyclically. 04/16/22  Yes Bensimhon, Shaune Pascal, MD  rosuvastatin (CRESTOR) 10 MG tablet Take 1 tablet (10 mg total) by mouth every Monday, Wednesday, and Friday. 02/28/22  Yes Bensimhon, Shaune Pascal, MD  warfarin (COUMADIN) 6 MG tablet TAKE 1 TABLET BY MOUTH EVERY DAY Patient taking differently: Take 3-6 mg by mouth See admin instructions. Take 1 tablet (6 mg) by mouth in the evening on Mondays & Fridays.  Take 0.5 tablet (3 mg) by mouth in the evening on Sundays, Tuesdays, Wednesdays, Thursdays & Saturdays. 12/14/21  Yes Bensimhon, Shaune Pascal, MD  acetaminophen (TYLENOL) 500 MG tablet Take 1,000 mg by mouth every 6 (six) hours as needed for moderate pain.     [provider]  cyclobenzaprine (FLEXERIL) 5 MG tablet Take 1 tablet (5 mg total) by mouth 3 (three) times daily as needed for muscle spasms. 06/30/20   Bensimhon, Shaune Pascal, MD  traMADol (ULTRAM) 50 MG tablet Take 2  tablets (100 mg total) by mouth every 6 (six) hours as needed for moderate pain. Patient taking differently: Take 50-100 mg by mouth daily as needed for moderate pain. 08/30/20   Bensimhon, Shaune Pascal, MD  traZODone (DESYREL) 50 MG tablet Take 50 mg by mouth at bedtime as needed for sleep.    [provider]     Family History  Problem Relation Age of Onset   Hypertension Father    Heart attack Father    Heart Problems Sister    Colon cancer Neg Hx    Stomach cancer Neg Hx    Esophageal cancer Neg Hx    Colon polyps Neg Hx     Social History   Socioeconomic History   Marital status: Married    Spouse name: Not on file   Number of children: 0   Years of education: Not on file   Highest education level: Not on file  Occupational History   Occupation: retired  Tobacco Use   Smoking status: Former     Packs/day: 0.30    Years: 0.00    Total pack years: 0.00    Types: Cigarettes    Quit date: 06/12/1969    Years since quitting: 53.0   Smokeless tobacco: Never   Tobacco comments:    pt smoked while in [employer] 2-3 cig x 6 months.  Substance and Sexual Activity   Alcohol use: No    Alcohol/week: 0.0 standard drinks of alcohol   Drug use: No   Sexual activity: Not Currently  Other Topics Concern   Not on file  Social History Narrative   Not on file   Social Determinants of Health   Financial Resource Strain: Not on file  Food Insecurity: No Food Insecurity (06/21/2022)   Hunger Vital Sign    Worried About Running Out of Food in the Last Year: Never true    Ran Out of Food in the Last Year: Never true  Transportation Needs: No Transportation Needs (06/21/2022)   PRAPARE - Hydrologist (Medical): No    Lack of Transportation (Non-Medical): No  Physical Activity: Not on file  Stress: Not on file  Social Connections: Not on file    Review of Systems: A 12 point ROS discussed and pertinent positives are indicated in the HPI above.  All other systems are negative.  Review of Systems  Constitutional:  Negative for appetite change and fatigue.  Respiratory:  Negative for cough and shortness of breath.   Cardiovascular:  Negative for chest pain and leg swelling.  Gastrointestinal:  Negative for abdominal pain, diarrhea, nausea and vomiting.  Musculoskeletal:  Positive for arthralgias and myalgias.  Neurological:  Positive for headaches. Negative for dizziness.    Vital Signs: BP 97/82 (BP Location: Left Arm)   Pulse 84   Temp 97.6 F (36.4 C) (Oral)   Resp 20   Ht '5\' 10"'$  (1.778 m)   Wt 182 lb 1.6 oz (82.6 kg)   SpO2 97%   BMI 26.13 kg/m   Physical Exam Constitutional:      General: He is not in acute distress.    Appearance: He is not ill-appearing.  HENT:     Nose:     Comments: Left nostril packing in place.     Mouth/Throat:      Mouth: Mucous membranes are moist.     Pharynx: Oropharynx is clear.  Cardiovascular:     Rate and Rhythm: Normal rate.  Comments: LVAD.  Pulmonary:     Effort: Pulmonary effort is normal.     Breath sounds: Normal breath sounds.  Abdominal:     General: Bowel sounds are normal.     Palpations: Abdomen is soft.     Tenderness: There is no abdominal tenderness.     Comments: LVAD entry site in the RLQ. Site is clean, dry, covered in transparent dressing.   Musculoskeletal:     Right lower leg: No edema.     Left lower leg: No edema.  Skin:    General: Skin is warm and dry.  Neurological:     Mental Status: He is alert and oriented to person, place, and time.     Imaging: No results found.  Labs:  CBC: Recent Labs    06/18/22 0835 06/21/22 1826 06/22/22 0013 06/22/22 0458  WBC 9.5 9.7 8.4 8.7  HGB 13.1 11.9* 11.9* 12.4*  HCT 41.5 35.9* 36.2* 37.4*  PLT 243 205 215 214    COAGS: Recent Labs    06/20/22 1404 06/21/22 1252 06/22/22 0013 06/22/22 0458  INR 1.8* 1.6* 1.6* 1.5*    BMP: Recent Labs    06/18/22 0835 06/21/22 1826 06/22/22 0013 06/22/22 0458  NA 138 137 140 140  K 4.4 4.1 4.0 4.1  CL 101 105 106 105  CO2 '29 25 27 26  '$ GLUCOSE 138* 121* 144* 95  BUN '17 15 14 13  '$ CALCIUM 9.1 9.0 8.7* 9.1  CREATININE 1.09 1.07 1.01 1.06  GFRNONAA >60 >60 >60 >60    LIVER FUNCTION TESTS: Recent Labs    04/16/22 0913 05/31/22 2351 06/21/22 1826 06/22/22 0458  BILITOT 1.2 1.8* 1.4* 1.7*  AST 34 36 28 28  ALT '19 18 17 16  '$ ALKPHOS 142* 151* 151* 152*  PROT 8.4* 9.3* 7.0 7.1  ALBUMIN 4.3 4.1 3.3* 3.3*    TUMOR MARKERS: No results for input(s): "AFPTM", "CEA", "CA199", "CHROMGRNA" in the last 8760 hours.  Assessment and Plan:  Persistent epistaxis: [name], 76 year old male, presents today to the [facility] Interventional Radiology department for an image-guided nasal artery embolization. This procedure will be done under general  anesthesia.   Risks and benefits of this procedure were discussed with the patient including, but not limited to bleeding, infection, vascular injury or contrast induced renal failure. The risk of stroke was discussed.   This interventional procedure involves the use of X-rays and because of the nature of the planned procedure, it is possible that we will have prolonged use of X-ray fluoroscopy.  Potential radiation risks to you include (but are not limited to) the following: - A slightly elevated risk for cancer  several years later in life. This risk is typically less than 0.5% percent. This risk is low in comparison to the normal incidence of human cancer, which is 33% for women and 50% for men according to the [place]. - Radiation induced injury can include skin redness, resembling a rash, tissue breakdown / ulcers and hair loss (which can be temporary or permanent).   The likelihood of either of these occurring depends on the difficulty of the procedure and whether you are sensitive to radiation due to previous procedures, disease, or genetic conditions.   IF your procedure requires a prolonged use of radiation, you will be notified and given written instructions for further action.  It is your responsibility to monitor the irradiated area for the 2 weeks following the procedure and to notify your physician if you  are concerned that you have suffered a radiation induced injury.    All of the patient's questions were answered, patient is agreeable to proceed. He has been NPO. His last dose of Coumadin and Plavix was 06/21/22. He was placed on a heparin infusion - this infusion has been now been held.   Consent signed and in chart.  Thank you for this interesting consult.  I greatly enjoyed meeting CARVELL HOEFFNER and look forward to participating in their care.  A copy of this report was sent to the requesting provider on this date.  Electronically Signed: Soyla Dryer, AGACNP-BC [phone] 06/22/2022, 7:17 AM   I spent a total of 20 Minutes    in face to face in clinical consultation, greater than 50% of which was counseling/coordinating care for nasal artery embolization

## 2022-06-21 NOTE — Progress Notes (Signed)
LVAD Coordinator Rounding Note:  Admitted 06/21/22 to Dr. Haroldine Laws due to need for nasal artery embolization in IR.   HM II LVAD implanted on 09/21/14 by Dr. Darcey Nora under Destination Therapy criteria.  Patient sitting up in bed watching TV. He is awake and alert, and denies complaints. He is eager to have embolization procedure completed tomorrow in IR.   Left nare nasal packing in place.   Vital signs: Temp: 97.5 HR: AV paced 80 Doppler Pressure: 82 Automatic BP: 97/77 (85) O2 Sat: 96% on RA Wt: 184.3  lbs    LVAD interrogation reveals:  Speed: 9200 Flow: 4.5 Power: 5.1 w PI: 4.7  Alarms: none Events: none  Fixed speed: 9200 Low speed limit: 8600   Drive Line:  Dressing clean, dry, and intact. Anchor correctly applied. Wife says she wants to do dressing changes at this time. Dressing change per bedside RN or patient's wife [name]. Dressing change due 06/28/22 .   Labs:  LDH trend: 246  INR trend: 1.6  Anticoagulation Plan: -INR Goal:  2.0 - 3.0 -ASA Dose: 81 mg daily--on hold with nosebleeds -Plavix: on hold with nosebleeds  Blood Products:    Device: N/A  Arrythmias:  - 5/12//23 admitted with VT>>emergent DC-CV  - 11/10/21 complete heart block/bradycardia >>started on Dopamine gtt - 5/15 Recurrent VT -> DC-CV - 5/16 Recurrent VT-->DC-CV/ SP Dual Chamber MDT ICD   Drips: Heparin 800 units/hr   Plan/Recommendations:  Call VAD pager if any issues with VAD equipment or drive line site. Weekly dressing changes per pt's wife or bedside RN. VAD coordinator will accompany pt to IR tomorrow morning.  Emerson Monte RN [place] Coordinator  Office: [phone]  24/7 Pager: [phone]

## 2022-06-21 NOTE — Progress Notes (Signed)
ANTICOAGULATION CONSULT NOTE - Follow-up Consult  Pharmacy Consult for heparin Indication:  LVAD, history of PE  Allergies  Allergen Reactions   Biopatch [Chlorhexidine] Dermatitis and Rash   Desyrel [Trazodone] Other (See Comments)    insomnia   Other Dermatitis    Adhesive    Patient Measurements: Height: '5\' 10"'$  (177.8 cm) Weight: 83.6 kg (184 lb 4.9 oz) IBW/kg (Calculated) : 73 Heparin Dosing Weight: 84kg  Vital Signs: Temp: 98.4 F (36.9 C) (12/21 2057) Temp Source: Oral (12/21 2057) BP: 90/74 (12/21 2057) Pulse Rate: 80 (12/21 1145)  Labs: Recent Labs    06/20/22 1404 06/21/22 1252 06/21/22 1826 06/21/22 2104  HGB  --   --  11.9*  --   HCT  --   --  35.9*  --   PLT  --   --  205  --   LABPROT 20.5* 18.7*  --   --   INR 1.8* 1.6*  --   --   HEPARINUNFRC  --   --   --  <0.10*  CREATININE  --   --  1.07  --     Estimated Creatinine Clearance: 62.5 mL/min (by C-G formula based on SCr of 1.07 mg/dL).  Assessment: 76 year old male with HM2 LVAD presents to [facility] as a direct admit for heparin bridge prior to nasal artery embolization tomorrow 12/22.   INR was 1.8 on 12/20 and now down to 1.6 today. Warfarin has been on hold for the past couple days. Plavix and aspirin has also been held for his recurrent nose bleeds. Patient has nose packing in place. CBC was normal on 12/18, no overt bleeding noted today.  PM Update: HL <0.1  (goal <0.3, no up titration per team)  Goal of Therapy:  Heparin level < 0.3 Monitor platelets by anticoagulation protocol: Yes   Plan:  Discussed with HF team, will start heparin at fixed dose of 800 units/hr with no plans for uptitration unless levels are elevated. . Check heparin level this evening then daily  Continue Heparin at current rate and follow-up AM level.   Sloan Leiter, PharmD, BCPS, BCCCP Please refer to [facility] for [place] numbers 06/21/2022 10:11 PM

## 2022-06-21 NOTE — Plan of Care (Signed)
Initiation of care plan    Problem: Education: Goal: Knowledge of General Education information will improve Description: Including pain rating scale, medication(s)/side effects and non-pharmacologic comfort measures Outcome: Progressing   Problem: Health Behavior/Discharge Planning: Goal: Ability to manage health-related needs will improve Outcome: Progressing   Problem: Clinical Measurements: Goal: Ability to maintain clinical measurements within normal limits will improve Outcome: Progressing Goal: Will remain free from infection Outcome: Progressing Goal: Diagnostic test results will improve Outcome: Progressing Goal: Respiratory complications will improve Outcome: Progressing Goal: Cardiovascular complication will be avoided Outcome: Progressing   Problem: Activity: Goal: Risk for activity intolerance will decrease Outcome: Progressing   Problem: Nutrition: Goal: Adequate nutrition will be maintained Outcome: Progressing   Problem: Coping: Goal: Level of anxiety will decrease Outcome: Progressing   Problem: Elimination: Goal: Will not experience complications related to bowel motility Outcome: Progressing Goal: Will not experience complications related to urinary retention Outcome: Progressing   Problem: Pain Managment: Goal: General experience of comfort will improve Outcome: Progressing   Problem: Safety: Goal: Ability to remain free from injury will improve Outcome: Progressing   Problem: Skin Integrity: Goal: Risk for impaired skin integrity will decrease Outcome: Progressing   Problem: Education: Goal: Patient will understand all VAD equipment and how it functions Outcome: Progressing Goal: Patient will be able to verbalize current INR target range and antiplatelet therapy for discharge home Outcome: Progressing   Problem: Cardiac: Goal: LVAD will function as expected and patient will experience no clinical alarms Outcome: Progressing

## 2022-06-22 ENCOUNTER — Encounter (HOSPITAL_COMMUNITY): Payer: Self-pay | Admitting: Internal Medicine

## 2022-06-22 ENCOUNTER — Other Ambulatory Visit (HOSPITAL_COMMUNITY): Payer: Self-pay

## 2022-06-22 ENCOUNTER — Inpatient Hospital Stay (HOSPITAL_COMMUNITY): Payer: Medicare Other | Admitting: Anesthesiology

## 2022-06-22 ENCOUNTER — Inpatient Hospital Stay (HOSPITAL_COMMUNITY): Payer: Medicare Other

## 2022-06-22 ENCOUNTER — Inpatient Hospital Stay (HOSPITAL_COMMUNITY)
Admission: RE | Admit: 2022-06-22 | Payer: Medicare Other | Source: Home / Self Care | Admitting: Interventional Radiology

## 2022-06-22 ENCOUNTER — Encounter (HOSPITAL_COMMUNITY)
Admission: AD | Disposition: A | Discharge status: Home / Self Care | Payer: Self-pay | Source: Ambulatory Visit | Attending: Internal Medicine

## 2022-06-22 DIAGNOSIS — R040 Epistaxis: Secondary | ICD-10-CM

## 2022-06-22 DIAGNOSIS — I509 Heart failure, unspecified: Secondary | ICD-10-CM

## 2022-06-22 DIAGNOSIS — Z87891 Personal history of nicotine dependence: Secondary | ICD-10-CM

## 2022-06-22 DIAGNOSIS — I2510 Atherosclerotic heart disease of native coronary artery without angina pectoris: Secondary | ICD-10-CM

## 2022-06-22 DIAGNOSIS — I110 Hypertensive heart disease with heart failure: Secondary | ICD-10-CM

## 2022-06-22 HISTORY — PX: RADIOLOGY WITH ANESTHESIA: SHX6223

## 2022-06-22 HISTORY — PX: IR TRANSCATH/EMBOLIZ: IMG695

## 2022-06-22 HISTORY — PX: IR NEURO EACH ADD'L AFTER BASIC UNI LEFT (MS): IMG5373

## 2022-06-22 HISTORY — PX: IR ANGIO INTRA EXTRACRAN SEL COM CAROTID INNOMINATE BILAT MOD SED: IMG5360

## 2022-06-22 HISTORY — PX: IR ANGIO EXTERNAL CAROTID SEL EXT CAROTID BILAT MOD SED: IMG5372

## 2022-06-22 HISTORY — PX: IR US GUIDE VASC ACCESS RIGHT: IMG2390

## 2022-06-22 HISTORY — PX: IR NEURO EACH ADD'L AFTER BASIC UNI RIGHT (MS): IMG5374

## 2022-06-22 HISTORY — PX: IR ANGIO VERTEBRAL SEL SUBCLAVIAN INNOMINATE UNI L MOD SED: IMG5364

## 2022-06-22 HISTORY — PX: IR ANGIOGRAM FOLLOW UP STUDY: IMG697

## 2022-06-22 LAB — COMPREHENSIVE METABOLIC PANEL
ALT: 16 U/L (ref 0–44)
AST: 28 U/L (ref 15–41)
Albumin: 3.3 g/dL — ABNORMAL LOW (ref 3.5–5.0)
Alkaline Phosphatase: 152 U/L — ABNORMAL HIGH (ref 38–126)
Anion gap: 9 (ref 5–15)
BUN: 13 mg/dL (ref 8–23)
CO2: 26 mmol/L (ref 22–32)
Calcium: 9.1 mg/dL (ref 8.9–10.3)
Chloride: 105 mmol/L (ref 98–111)
Creatinine, Ser: 1.06 mg/dL (ref 0.61–1.24)
GFR, Estimated: >60 mL/min (ref >60)
Glucose, Bld: 95 mg/dL (ref 70–99)
Potassium: 4.1 mmol/L (ref 3.5–5.1)
Sodium: 140 mmol/L (ref 135–145)
Total Bilirubin: 1.7 mg/dL — ABNORMAL HIGH (ref 0.3–1.2)
Total Protein: 7.1 g/dL (ref 6.5–8.1)

## 2022-06-22 LAB — CBC WITH DIFFERENTIAL/PLATELET
Abs Immature Granulocytes: 0.06 10*3/uL (ref 0.00–0.07)
Basophils Absolute: 0 10*3/uL (ref 0.0–0.1)
Basophils Relative: 1 %
Eosinophils Absolute: 0.1 10*3/uL (ref 0.0–0.5)
Eosinophils Relative: 1 %
HCT: 37.4 % — ABNORMAL LOW (ref 39.0–52.0)
Hemoglobin: 12.4 g/dL — ABNORMAL LOW (ref 13.0–17.0)
Immature Granulocytes: 1 %
Lymphocytes Relative: 6 %
Lymphs Abs: 0.5 10*3/uL — ABNORMAL LOW (ref 0.7–4.0)
MCH: 33.2 pg (ref 26.0–34.0)
MCHC: 33.2 g/dL (ref 30.0–36.0)
MCV: 100 fL (ref 80.0–100.0)
Monocytes Absolute: 1.2 10*3/uL — ABNORMAL HIGH (ref 0.1–1.0)
Monocytes Relative: 13 %
Neutro Abs: 6.8 10*3/uL (ref 1.7–7.7)
Neutrophils Relative %: 78 %
Platelets: 214 10*3/uL (ref 150–400)
RBC: 3.74 MIL/uL — ABNORMAL LOW (ref 4.22–5.81)
RDW: 15.8 % — ABNORMAL HIGH (ref 11.5–15.5)
WBC: 8.7 10*3/uL (ref 4.0–10.5)
nRBC: 0 % (ref 0.0–0.2)

## 2022-06-22 LAB — CBC
HCT: 36.2 % — ABNORMAL LOW (ref 39.0–52.0)
Hemoglobin: 11.9 g/dL — ABNORMAL LOW (ref 13.0–17.0)
MCH: 32.8 pg (ref 26.0–34.0)
MCHC: 32.9 g/dL (ref 30.0–36.0)
MCV: 99.7 fL (ref 80.0–100.0)
Platelets: 215 10*3/uL (ref 150–400)
RBC: 3.63 MIL/uL — ABNORMAL LOW (ref 4.22–5.81)
RDW: 15.8 % — ABNORMAL HIGH (ref 11.5–15.5)
WBC: 8.4 10*3/uL (ref 4.0–10.5)
nRBC: 0 % (ref 0.0–0.2)

## 2022-06-22 LAB — HEPARIN LEVEL (UNFRACTIONATED): Heparin Unfractionated: <0.1 IU/mL — ABNORMAL LOW (ref 0.30–0.70)

## 2022-06-22 LAB — BASIC METABOLIC PANEL
Anion gap: 7 (ref 5–15)
BUN: 14 mg/dL (ref 8–23)
CO2: 27 mmol/L (ref 22–32)
Calcium: 8.7 mg/dL — ABNORMAL LOW (ref 8.9–10.3)
Chloride: 106 mmol/L (ref 98–111)
Creatinine, Ser: 1.01 mg/dL (ref 0.61–1.24)
GFR, Estimated: >60 mL/min (ref >60)
Glucose, Bld: 144 mg/dL — ABNORMAL HIGH (ref 70–99)
Potassium: 4 mmol/L (ref 3.5–5.1)
Sodium: 140 mmol/L (ref 135–145)

## 2022-06-22 LAB — GLUCOSE, CAPILLARY
Glucose-Capillary: 117 mg/dL — ABNORMAL HIGH (ref 70–99)
Glucose-Capillary: 122 mg/dL — ABNORMAL HIGH (ref 70–99)
Glucose-Capillary: 128 mg/dL — ABNORMAL HIGH (ref 70–99)
Glucose-Capillary: 144 mg/dL — ABNORMAL HIGH (ref 70–99)
Glucose-Capillary: 98 mg/dL (ref 70–99)

## 2022-06-22 LAB — POCT ACTIVATED CLOTTING TIME
Activated Clotting Time: 190 seconds
Activated Clotting Time: 190 seconds

## 2022-06-22 LAB — PROTIME-INR
INR: 1.6 — ABNORMAL HIGH (ref 0.8–1.2)
INR: 1.5 — ABNORMAL HIGH (ref 0.8–1.2)
Prothrombin Time: 19.1 seconds — ABNORMAL HIGH (ref 11.4–15.2)
Prothrombin Time: 18.1 seconds — ABNORMAL HIGH (ref 11.4–15.2)

## 2022-06-22 LAB — LACTATE DEHYDROGENASE: LDH: 220 U/L — ABNORMAL HIGH (ref 98–192)

## 2022-06-22 SURGERY — IR WITH ANESTHESIA
Anesthesia: General

## 2022-06-22 MED ORDER — ETOMIDATE 2 MG/ML IV SOLN
INTRAVENOUS | Status: AC
  Filled 2022-06-22: qty 10

## 2022-06-22 MED ORDER — ACETAMINOPHEN 160 MG/5ML PO SOLN: 650.0000 mg | ORAL | Status: DC | PRN

## 2022-06-22 MED ORDER — NITROGLYCERIN 1 MG/10 ML FOR IR/CATH LAB
INTRA_ARTERIAL | Status: AC
  Filled 2022-06-22: qty 10

## 2022-06-22 MED ORDER — IOHEXOL 300 MG/ML  SOLN
150.0000 mL | Freq: Once | INTRAMUSCULAR | Status: AC | PRN
  Administered 2022-06-22: 75 mL via INTRA_ARTERIAL

## 2022-06-22 MED ORDER — PHENYLEPHRINE HCL-NACL 20-0.9 MG/250ML-% IV SOLN
INTRAVENOUS | Status: DC | PRN
  Administered 2022-06-22: 25 ug/min via INTRAVENOUS

## 2022-06-22 MED ORDER — CEFAZOLIN SODIUM-DEXTROSE 2-3 GM-%(50ML) IV SOLR
INTRAVENOUS | Status: DC | PRN
  Administered 2022-06-22: 2 g via INTRAVENOUS

## 2022-06-22 MED ORDER — ALBUMIN HUMAN 5 % IV SOLN: INTRAVENOUS | Status: DC | PRN

## 2022-06-22 MED ORDER — ONDANSETRON HCL 4 MG/2ML IJ SOLN
INTRAMUSCULAR | Status: DC | PRN
  Administered 2022-06-22: 4 mg via INTRAVENOUS

## 2022-06-22 MED ORDER — CHLORHEXIDINE GLUCONATE 0.12 % MT SOLN
OROMUCOSAL | Status: AC
  Filled 2022-06-22: qty 15

## 2022-06-22 MED ORDER — FENTANYL CITRATE (PF) 250 MCG/5ML IJ SOLN
INTRAMUSCULAR | Status: AC
  Filled 2022-06-22: qty 5

## 2022-06-22 MED ORDER — HEPARIN (PORCINE) 25000 UT/250ML-% IV SOLN
1000.0000 [IU]/h | INTRAVENOUS | Status: DC
  Administered 2022-06-22 – 2022-06-25 (×3): 800 [IU]/h via INTRAVENOUS
  Administered 2022-06-26: 1000 [IU]/h via INTRAVENOUS
  Filled 2022-06-22 (×5): qty 250

## 2022-06-22 MED ORDER — LACTATED RINGERS IV SOLN: INTRAVENOUS | Status: DC | PRN

## 2022-06-22 MED ORDER — ROCURONIUM BROMIDE 10 MG/ML (PF) SYRINGE
PREFILLED_SYRINGE | INTRAVENOUS | Status: DC | PRN
  Administered 2022-06-22: 50 mg via INTRAVENOUS
  Administered 2022-06-22: 10 mg via INTRAVENOUS
  Administered 2022-06-22 (×2): 20 mg via INTRAVENOUS

## 2022-06-22 MED ORDER — LIDOCAINE 2% (20 MG/ML) 5 ML SYRINGE
INTRAMUSCULAR | Status: DC | PRN
  Administered 2022-06-22: 40 mg via INTRAVENOUS

## 2022-06-22 MED ORDER — ACETAMINOPHEN 325 MG PO TABS: 650.0000 mg | ORAL_TABLET | ORAL | Status: DC | PRN

## 2022-06-22 MED ORDER — FENTANYL CITRATE (PF) 250 MCG/5ML IJ SOLN
INTRAMUSCULAR | Status: DC | PRN
  Administered 2022-06-22: 50 ug via INTRAVENOUS

## 2022-06-22 MED ORDER — TRAMADOL HCL 50 MG PO TABS
ORAL_TABLET | ORAL | Status: AC
  Filled 2022-06-22: qty 1

## 2022-06-22 MED ORDER — HEPARIN SODIUM (PORCINE) 1000 UNIT/ML IJ SOLN
INTRAMUSCULAR | Status: DC | PRN
  Administered 2022-06-22: 500 [IU] via INTRAVENOUS
  Administered 2022-06-22: 3000 [IU] via INTRAVENOUS

## 2022-06-22 MED ORDER — PROPOFOL 10 MG/ML IV BOLUS
INTRAVENOUS | Status: AC
  Filled 2022-06-22: qty 20

## 2022-06-22 MED ORDER — CEFAZOLIN SODIUM-DEXTROSE 2-4 GM/100ML-% IV SOLN
INTRAVENOUS | Status: AC
  Filled 2022-06-22: qty 100

## 2022-06-22 MED ORDER — CLOPIDOGREL BISULFATE 75 MG PO TABS
75.0000 mg | ORAL_TABLET | Freq: Every day | ORAL | Status: DC
  Administered 2022-06-22 – 2022-06-27 (×6): 75 mg via ORAL
  Filled 2022-06-22 (×6): qty 1

## 2022-06-22 MED ORDER — IOHEXOL 300 MG/ML  SOLN: 150.0000 mL | Freq: Once | INTRAMUSCULAR | Status: DC | PRN

## 2022-06-22 MED ORDER — ACETAMINOPHEN 650 MG RE SUPP: 650.0000 mg | RECTAL | Status: DC | PRN

## 2022-06-22 MED ORDER — DEXAMETHASONE SODIUM PHOSPHATE 10 MG/ML IJ SOLN
INTRAMUSCULAR | Status: DC | PRN
  Administered 2022-06-22: 4 mg via INTRAVENOUS

## 2022-06-22 MED ORDER — ERYTHROMYCIN 5 MG/GM OP OINT
TOPICAL_OINTMENT | Freq: Three times a day (TID) | OPHTHALMIC | Status: DC
  Filled 2022-06-22: qty 3.5

## 2022-06-22 MED ORDER — SODIUM CHLORIDE 0.9 % IV SOLN: INTRAVENOUS | Status: DC

## 2022-06-22 MED ORDER — ETOMIDATE 2 MG/ML IV SOLN
INTRAVENOUS | Status: DC | PRN
  Administered 2022-06-22: 4 mg via INTRAVENOUS
  Administered 2022-06-22: 10 mg via INTRAVENOUS

## 2022-06-22 MED ORDER — VASOPRESSIN 20 UNIT/ML IV SOLN
INTRAVENOUS | Status: DC | PRN
  Administered 2022-06-22 (×4): 1 [IU] via INTRAVENOUS

## 2022-06-22 MED ORDER — LACTATED RINGERS IV SOLN: INTRAVENOUS | Status: DC

## 2022-06-22 MED ORDER — NOREPINEPHRINE 4 MG/250ML-% IV SOLN
INTRAVENOUS | Status: DC | PRN
  Administered 2022-06-22: 2 ug/min via INTRAVENOUS

## 2022-06-22 MED ORDER — CYCLOBENZAPRINE HCL 10 MG PO TABS
ORAL_TABLET | ORAL | Status: AC
  Administered 2022-06-22: 5 mg via ORAL
  Filled 2022-06-22: qty 1

## 2022-06-22 MED ORDER — HEPARIN (PORCINE) 25000 UT/250ML-% IV SOLN
800.0000 [IU]/h | INTRAVENOUS | Status: DC
  Filled 2022-06-22: qty 250

## 2022-06-22 MED ORDER — IOHEXOL 300 MG/ML  SOLN
100.0000 mL | Freq: Once | INTRAMUSCULAR | Status: AC | PRN
  Administered 2022-06-22: 25 mL via INTRA_ARTERIAL

## 2022-06-22 MED ORDER — SUGAMMADEX SODIUM 200 MG/2ML IV SOLN
INTRAVENOUS | Status: DC | PRN
  Administered 2022-06-22 (×4): 100 mg via INTRAVENOUS

## 2022-06-22 NOTE — Anesthesia Preprocedure Evaluation (Signed)
Anesthesia Evaluation  Patient identified by MRN, date of birth, ID band Patient awake    Reviewed: Allergy & Precautions, NPO status , Patient's Chart, lab work & pertinent test results  Airway Mallampati: II  TM Distance: >3 FB Neck ROM: Full    Dental  (+) Dental Advisory Given   Pulmonary former smoker   breath sounds clear to auscultation       Cardiovascular hypertension, Pt. on medications + CAD, + Past MI and +CHF   Rhythm:Regular Rate:Normal     Neuro/Psych negative neurological ROS     GI/Hepatic negative GI ROS, Neg liver ROS,,,  Endo/Other  diabetes, Type 2    Renal/GU Renal disease     Musculoskeletal  (+) Arthritis ,    Abdominal   Peds  Hematology  (+) Blood dyscrasia, anemia   Anesthesia Other Findings   Reproductive/Obstetrics                             10/2021 Echo IMPRESSIONS     1. Left ventricular ejection fraction, by estimation, is <20%. The left  ventricle has severely decreased function. The left ventricle demonstrates  global hypokinesis. The left ventricular internal cavity size was severely  dilated. Left ventricular  diastolic parameters are indeterminate.   2. S/p HMII LVAD. Inflow cannula now well visualized   3. Right ventricular systolic function is severely reduced. The right  ventricular size is moderately enlarged.   4. Left atrial size was moderately dilated.   5. Right atrial size was severely dilated.   6. The mitral valve is normal in structure. Mild mitral valve  regurgitation.   7. The aortic valve was not well visualized. Aortic valve regurgitation  is mild to moderate. No AV opening seen   8. Aortic dilatation noted. There is dilatation of the ascending aorta,  measuring 43 mm.   Lab Results  Component Value Date   WBC 8.7 06/22/2022   HGB 12.4 (L) 06/22/2022   HCT 37.4 (L) 06/22/2022   MCV 100.0 06/22/2022   PLT 214 06/22/2022    Lab Results  Component Value Date   CREATININE 1.06 06/22/2022   BUN 13 06/22/2022   NA 140 06/22/2022   K 4.1 06/22/2022   CL 105 06/22/2022   CO2 26 06/22/2022    Anesthesia Physical Anesthesia Plan  ASA: 4  Anesthesia Plan: General   Post-op Pain Management: Tylenol PO (pre-op)*   Induction: Intravenous  PONV Risk Score and Plan: 2 and Dexamethasone, Ondansetron and Treatment may vary due to age or medical condition  Airway Management Planned: Oral ETT  Additional Equipment: Arterial line  Intra-op Plan:   Post-operative Plan: Extubation in OR  Informed Consent: I have reviewed the patients History and Physical, chart, labs and discussed the procedure including the risks, benefits and alternatives for the proposed anesthesia with the patient or authorized representative who has indicated his/her understanding and acceptance.     Dental advisory given  Plan Discussed with: CRNA  Anesthesia Plan Comments:          Anesthesia Quick Evaluation

## 2022-06-22 NOTE — Anesthesia Postprocedure Evaluation (Signed)
Anesthesia Post Note  Patient: Scott [name]  Procedure(s) Performed: Embolization of severe epistaxis     Patient location during evaluation: PACU Anesthesia Type: General Level of consciousness: awake and alert Pain management: pain level controlled Vital Signs Assessment: post-procedure vital signs reviewed and stable Respiratory status: spontaneous breathing, nonlabored ventilation, respiratory function stable and patient connected to nasal cannula oxygen Cardiovascular status: blood pressure returned to baseline and stable Postop Assessment: no apparent nausea or vomiting Anesthetic complications: yes  Encounter Notable Events  Notable Event Outcome Phase Comment  Difficult to intubate - expected  Intraprocedure Filed from anesthesia note documentation.    Last Vitals:  Vitals:   06/22/22 1400 06/22/22 1415  BP: 92/81   Pulse: (!) 111   Resp: (!) 23 (!) 22  Temp:    SpO2: 94% 94%    Last Pain:  Vitals:   06/22/22 1415  TempSrc:   PainSc: 0-No pain    LLE Motor Response: Purposeful movement (06/22/22 1415) LLE Sensation: Full sensation (06/22/22 1415) RLE Motor Response: Purposeful movement;Responds to commands (06/22/22 1415) RLE Sensation: Full sensation (06/22/22 1415)      Tiajuana Amass

## 2022-06-22 NOTE — Progress Notes (Addendum)
Advanced Heart Failure VAD Team Note  PCP-Cardiologist: None   Subjective:    Seen in PACU post nasal artery embolization via RFA by IR today.  Briefly required levophed for hypotension during the procedure, now off (may be due to sedation).  Warfarin on hold. INR 1.5.  Waking up. No dyspnea. Noting some drainage from left eye.   LVAD INTERROGATION:  HeartMate II LVAD:   Flow 4.0 liters/min, speed 9200, power 5.0, PI 5.2.   Low flows during procedure - treated with IVF and pressors  Objective:    Vital Signs:   Temp:  [97.5 F (36.4 C)-98.7 F (37.1 C)] 97.6 F (36.4 C) (12/22 0443) Pulse Rate:  [80-84] 84 (12/21 2335) Resp:  [16-31] 20 (12/22 0443) BP: (90-125)/(45-93) 97/82 (12/22 0443) SpO2:  [94 %-97 %] 97 % (12/22 0443) Weight:  [82.6 kg-83.6 kg] 82.6 kg (12/22 0443) Last BM Date : 06/20/22 Mean arterial Pressure 80s-90s  Intake/Output:   Intake/Output Summary (Last 24 hours) at 06/22/2022 0749 Last data filed at 06/22/2022 0436 Gross per 24 hour  Intake 216.77 ml  Output 425 ml  Net -208.23 ml     Physical Exam    Physical Exam: GENERAL: Resting comfortably in bed. HEENT: purulent drainage from left eye, packing left nare NECK: Supple, JVP 10 cm.   CARDIAC:  Mechanical heart sounds with LVAD hum present.  LUNGS:  Clear to auscultation bilaterally.  ABDOMEN:  Soft, round, nontender, positive bowel sounds x4.     LVAD exit site: well-healed and incorporated.  Stabilization device present and accurately applied.   EXTREMITIES:  Warm and dry, no cyanosis, clubbing, rash, 1+ edema NEUROLOGIC:  Alert and oriented x 4.  Affect pleasant.    Telemetry   AV paced, 80s  Labs   Basic Metabolic Panel: Recent Labs  Lab 06/18/22 0835 06/21/22 1826 06/22/22 0013 06/22/22 0458  NA 138 137 140 140  K 4.4 4.1 4.0 4.1  CL 101 105 106 105  CO2 '29 25 27 26  '$ GLUCOSE 138* 121* 144* 95  BUN '17 15 14 13  '$ CREATININE 1.09 1.07 1.01 1.06  CALCIUM 9.1 9.0  8.7* 9.1    Liver Function Tests: Recent Labs  Lab 06/21/22 1826 06/22/22 0458  AST 28 28  ALT 17 16  ALKPHOS 151* 152*  BILITOT 1.4* 1.7*  PROT 7.0 7.1  ALBUMIN 3.3* 3.3*   No results for input(s): "LIPASE", "AMYLASE" in the last 168 hours. No results for input(s): "AMMONIA" in the last 168 hours.  CBC: Recent Labs  Lab 06/18/22 0835 06/21/22 1826 06/22/22 0013 06/22/22 0458  WBC 9.5 9.7 8.4 8.7  NEUTROABS  --  7.7  --  6.8  HGB 13.1 11.9* 11.9* 12.4*  HCT 41.5 35.9* 36.2* 37.4*  MCV 102.7* 99.4 99.7 100.0  PLT 243 205 215 214    INR: Recent Labs  Lab 06/18/22 0835 06/20/22 1404 06/21/22 1252 06/22/22 0013 06/22/22 0458  INR 2.1* 1.8* 1.6* 1.6* 1.5*    Other results: EKG:    Imaging   No results found.   Medications:     Scheduled Medications:  amiodarone  100 mg Oral Daily   cephALEXin  500 mg Oral TID   chlorhexidine       furosemide  20 mg Oral Daily   pantoprazole  40 mg Oral Daily   potassium chloride  20 mEq Oral Daily   rosuvastatin  10 mg Oral Q M,W,F    Infusions:  sodium chloride 10 mL/hr at 06/22/22  7035    PRN Medications: acetaminophen, chlorhexidine, cyclobenzaprine, ondansetron (ZOFRAN) IV, mouth rinse, traMADol, traZODone   Patient Profile    76 y/o male with h/o CAD with ankylosing spondylitis, DM2, carotid stenting, severe ischemic CM s/p anterior STEM with VT arrest in 12/15, large PE with pulmonary infarct in 2/15 s/p IVC filter and systolic HF with EF 00% and recent refractory epistaxis.  Admitted for planned nasal artery embolization and management of anticoagulation.  Assessment/Plan:     1. Epistaxis, refractory - has failed management with nasal packing and ENT - s/p nasal artery embolization by IR today - Packing removed at bedside by Dr. Haroldine Laws post procedure - INR 1.5 this am. Restart heparin tonight at fixed rate, no bolus   2. Chronic systolic HF: Ischemic cardiomyopathy, EF 20% s/p HMII LVAD  09/20/13.   - Stable NYHA II - MAPs ok.  - RV failure under good control with daily lasix - Briefly required pressor support during IR procedure, now off NE with stable MAP - Received 2.5L fluids during today's procedure. Will need to diurese tomorrow.   3. Esophageal stricture - symptomatically improved with cutting food up - Barium swallow 7/23 suggestive of stricture at [place] + esophageal dysmotility - has seen GI. Discussed EGD but he wants to defer for now as symptoms improved - reminded him to be vigilant about cutting/chewing his food well  - No change   4. VT/VF c/b CHB - much improved with CRT-D with LBBB lead 5/23 - no further VT - Continue amio at 200 daily - keep K > 4.0. Mg > 2.0   5. CAD s/p Anterior STEMI on 06/12/14 with stenting of LAD:  - No s/s ischemia - ASA on hold with epistaxis. Resume later - Previously stopped statin but now back on crestor 3x/week. No change   6. DL infection, chronic - Site looks good  - Remains on suppressive Keflex - Continue weekly dressing changes   7. VAD management:  - Had admit in 08/2017 for elevated LDH.  - LDH stabilized 300-380 range.  - LDH pending  - Off asa with recent epistaxis. Holding coumadin for planned procedure.  - Goal INR 2.0-2.5. INR 1.5 this am.  Management of AC as above. - Continue Keflex as above for DL - VAD interrogated personally. Parameters stable.   8. Ankylosing spondylitis: Off NSAIDS. -  No longer following with rheumatology. Uses tramadol. Avoids NSAIDs as much as possible. -  No change   9. HTN:  - MAPs ok   10.  Kidney Stones  - CT with renal calculi bilaterally. Had some previous hematuria. None recently  Now followed by Dr Tresa Moore at [facility].  11. L eye drainage - Will order erythromycin ointment    Monitor in 2H overnight.   I reviewed the LVAD parameters from today, and compared the results to the patient's prior recorded data.  No programming changes were made.   The LVAD is functioning within specified parameters.  The patient performs LVAD self-test daily.  LVAD interrogation was negative for any significant power changes, alarms or PI events/speed drops.  LVAD equipment check completed and is in good working order.  Back-up equipment present.   LVAD education done on emergency procedures and precautions and reviewed exit site care.  Length of Stay: 1  FINCH, LINDSAY N, PA-C 06/22/2022, 7:49 AM  VAD Team --- VAD ISSUES ONLY--- Pager [phone] (7am - 7am)  Advanced Heart Failure Team  Pager [phone] (M-F; 7a - 5p)  Please contact [facility] for night-coverage after hours (5p -7a ) and weekends on [email or web address]   Patient seen and examined with the above-signed Advanced Practice Provider and/or Housestaff. I personally reviewed laboratory data, imaging studies and relevant notes. I independently examined the patient and formulated the important aspects of the plan. I have edited the note to reflect any of my changes or salient points. I have personally discussed the plan with the patient and/or family.  Patient seen in PACU post-procedure. Doing well. Now off pressors. BP stable. Groin site ok. Denies SOB  General:  NAD.  HEENT: normal  Neck: supple. JVP not elevated.  Carotids 2+ bilat; no bruits. No lymphadenopathy or thryomegaly appreciated. Cor: LVAD hum.  Lungs: Clear. Abdomen: obese soft, nontender, non-distended. No hepatosplenomegaly. No bruits or masses. Good bowel sounds. Driveline site clean. Anchor in place.  Extremities: no cyanosis, clubbing, rash. Warm tr-1+ edema  Neuro: alert & oriented x 3. No focal deficits. Moves all 4 without problem   Stable s/p bilateral nasal artery embolization. I d/w ENT and I removed nasal packing without incident. Will resume fixed dose heparin at 10p with no bolus. Will need gentle diuresis. Resume warfarin. VAD interrogated personally. Parameters stable.  [name], MD  4:39 PM

## 2022-06-22 NOTE — Sedation Documentation (Signed)
Pt transported to PACU bay 5 accompanied by RN, CRNA, and LVAD coordinator. Levada Dy RN at bedside. Handoff complete. Right groin remains level 0. Bilateral lower distal pulses present via doppler. Pt awake alert and oriented. No s/s of distress at this time.

## 2022-06-22 NOTE — Progress Notes (Signed)
VAD Coordinator Procedure Note:   VAD Coordinator met patient in SS. Pt undergoing embolization of nasal artery for severe epistaxis per Dr. Estanislado Pandy. Hemodynamics and VAD parameters monitored by myself and anesthesia throughout the procedure. Blood pressures were obtained with arterial line on right wrist.    Time: Arterial BP Auto  BP Flow PI Power Speed  Pre-procedure:  0800  94/75 4.2 5 5.4 9200   0923 117/61 (95)  3.8 5.4 4.9 9200           Sedation Induction: 0938 108/94 (99)  3.4 4.8 4.6 9200   0942 105/93 (98)  3.4 5 4.6 9200   0945 110/93 (110)  3.7 5.2 4.8 9200   1000 102/92 (100)  3.3 4.5 4.6 9200   1015 98/90  3.7 4 4.8 9200   1030 106/98 (102)  3 4 4.4 9200   1045 107/99 (101)  3 3.8 4.4 9200   1100 97/89( 92)  3.2 4 4.5 9200   1115 81/75 (78)  3.7 3.7 4.9 9200   1130 86/79 (80)  3.7 3.9 4.9 9200   1145 97/76 (90)  3.6 4.2 4.7 9200   1200 95/88 (91)  3.6 4.1 4.7 9200   1215 105/95 (99)  3.3 4.5 4.6 9200   1230 105/95 (99)  3.6 4.2 4.6 9200  Recovery Area: 1259 97/76 (87) 98/64(76) 3.9 4.9 5 9200   1310 96/87 (92)  4.1 4.7 5 9200   1330 90/86 (91)  3.9 4.9 5 9200   1345 100/87 (92)  3.9 5.1 4.9 9200   1400 98/85 (89) 92/81(87) 4.2 5 5.1 9200   1430 94/83 (87)  3.9 5.2 5 9200   1500 97/84 (90) 100/84(91) 4.1 5.2 5 9200   1600 98/85 (90)  4.1 5.1 5 9200   1700 93/81 (85)  4.2 5.2 5 9200    Patient tolerated the procedure well. VAD Coordinator accompanied and remained with patient in recovery area.    Patient Disposition: pt transported to [facility] and handoff given to bedside nurse.     [name] RN, BSN VAD Coordinator 24/7 Pager [phone]

## 2022-06-22 NOTE — Progress Notes (Signed)
Transported to short stay by bed, L Vad  coordinator made aware.

## 2022-06-22 NOTE — Progress Notes (Signed)
ANTICOAGULATION CONSULT NOTE - Follow-up Consult  Pharmacy Consult for heparin Indication:  LVAD, history of PE  Allergies  Allergen Reactions   Biopatch [Chlorhexidine] Dermatitis and Rash   Desyrel [Trazodone] Other (See Comments)    insomnia   Other Dermatitis    Adhesive    Patient Measurements: Height: '5\' 10"'$  (177.8 cm) Weight: 82.6 kg (182 lb 1.6 oz) IBW/kg (Calculated) : 73 Heparin Dosing Weight: 84kg  Vital Signs: Temp: 97.6 F (36.4 C) (12/22 0443) Temp Source: Oral (12/22 0443) BP: 97/82 (12/22 0443) Pulse Rate: 84 (12/21 2335)  Labs: Recent Labs    06/21/22 1252 06/21/22 1826 06/21/22 1826 06/21/22 2104 06/22/22 0013 06/22/22 0458  HGB  --  11.9*   < >  --  11.9* 12.4*  HCT  --  35.9*  --   --  36.2* 37.4*  PLT  --  205  --   --  215 214  LABPROT 18.7*  --   --   --  19.1* 18.1*  INR 1.6*  --   --   --  1.6* 1.5*  HEPARINUNFRC  --   --   --  <0.10* <0.10*  --   CREATININE  --  1.07  --   --  1.01 1.06   < > = values in this interval not displayed.     Estimated Creatinine Clearance: 63.1 mL/min (by C-G formula based on SCr of 1.06 mg/dL).  Assessment: 76 year old male with HM2 LVAD presents to [facility] as a direct admit for heparin bridge prior to nasal artery embolization tomorrow 12/22.   INR was 1.8 on 12/20 and now down to 1.5 today. Warfarin has been on hold for the past couple days. Plavix and aspirin has also been held for his recurrent nose bleeds. Patient has nose packing in place. CBC stable, no overt bleeding noted today.  Heparin level undetectable this morning as expected. Heparin now being held for procedure.   Goal of Therapy:  Heparin level < 0.3 Monitor platelets by anticoagulation protocol: Yes   Plan:  Follow up after IR procedure  Erin Hearing PharmD., BCPS Clinical Pharmacist 06/22/2022 7:47 AM

## 2022-06-22 NOTE — Procedures (Signed)
INR

## 2022-06-22 NOTE — Progress Notes (Signed)
ANTICOAGULATION CONSULT NOTE - Follow-up Consult  Pharmacy Consult for heparin Indication:  LVAD, history of PE  Allergies  Allergen Reactions   Biopatch [Chlorhexidine] Dermatitis and Rash   Desyrel [Trazodone] Other (See Comments)    insomnia   Other Dermatitis    Adhesive    Patient Measurements: Height: '5\' 10"'$  (177.8 cm) Weight: 82.6 kg (182 lb 1.6 oz) IBW/kg (Calculated) : 73 Heparin Dosing Weight: 84kg  Vital Signs: Temp: 97.8 F (36.6 C) (12/22 0758) Temp Source: Oral (12/22 0758) BP: 94/75 (12/22 0758) Pulse Rate: 79 (12/22 0758)  Labs: Recent Labs    06/21/22 1252 06/21/22 1826 06/21/22 1826 06/21/22 2104 06/22/22 0013 06/22/22 0458  HGB  --  11.9*   < >  --  11.9* 12.4*  HCT  --  35.9*  --   --  36.2* 37.4*  PLT  --  205  --   --  215 214  LABPROT 18.7*  --   --   --  19.1* 18.1*  INR 1.6*  --   --   --  1.6* 1.5*  HEPARINUNFRC  --   --   --  <0.10* <0.10*  --   CREATININE  --  1.07  --   --  1.01 1.06   < > = values in this interval not displayed.     Estimated Creatinine Clearance: 63.1 mL/min (by C-G formula based on SCr of 1.06 mg/dL).  Assessment: 76 year old male with HM2 LVAD presents to [facility] as a direct admit for heparin bridge prior to nasal artery embolization tomorrow 12/22.   INR was 1.8 on 12/20 and now down to 1.5 today. Warfarin has been on hold for the past couple days. Plavix and aspirin has also been held for his recurrent nose bleeds. Patient has nose packing in place. CBC stable, no overt bleeding noted today.  Heparin level undetectable this morning as expected. Heparin now being held for procedure.   Goal of Therapy:  Heparin level < 0.3 Monitor platelets by anticoagulation protocol: Yes   Plan:  Follow up after IR procedure   Addendum Received messaging from LVAD coordinator that heparin can be resumed in 4 hours. Will restart at previous low rate. No need for heparin level today, will follow up in am.  Erin Hearing  PharmD., BCPS Clinical Pharmacist 06/22/2022 12:54 PM

## 2022-06-22 NOTE — Anesthesia Procedure Notes (Signed)
Procedure Name: Intubation Date/Time: 06/22/2022 9:43 AM  Performed by: Janene Harvey, CRNAPre-anesthesia Checklist: Patient identified, Emergency Drugs available, Suction available and Patient being monitored Patient Re-evaluated:Patient Re-evaluated prior to induction Oxygen Delivery Method: Circle system utilized Preoxygenation: Pre-oxygenation with 100% oxygen Induction Type: IV induction Ventilation: Mask ventilation without difficulty and Oral airway inserted - appropriate to patient size Laryngoscope Size: Glidescope and 3 Grade View: Grade I Tube type: Oral Tube size: 7.5 mm Number of attempts: 1 Airway Equipment and Method: Stylet and Oral airway Placement Confirmation: ETT inserted through vocal cords under direct vision, positive ETCO2 and breath sounds checked- equal and bilateral Secured at: 23 cm Tube secured with: Tape Dental Injury: Teeth and Oropharynx as per pre-operative assessment  Difficulty Due To: Difficulty was anticipated and Difficult Airway- due to reduced neck mobility Comments: Elective glidescope, limited ROM c-spine/hx of glidescope intubation

## 2022-06-22 NOTE — Transfer of Care (Signed)
Immediate Anesthesia Transfer of Care Note  Patient: Scott [name]  Procedure(s) Performed: Embolization of severe epistaxis  Patient Location: PACU  Anesthesia Type:General  Level of Consciousness: awake and patient cooperative  Airway & Oxygen Therapy: Patient Spontanous Breathing and Patient connected to face mask oxygen  Post-op Assessment: Report given to RN and Post -op Vital signs reviewed and stable  Post vital signs: Reviewed and stable  Last Vitals:  Vitals Value Taken Time  BP    Temp    Pulse    Resp    SpO2      Last Pain:  Vitals:   06/22/22 0758  TempSrc: Oral  PainSc:          Complications:  Encounter Notable Events  Notable Event Outcome Phase Comment  Difficult to intubate - expected  Intraprocedure Filed from anesthesia note documentation.

## 2022-06-22 NOTE — Plan of Care (Signed)
  Problem: Clinical Measurements: Goal: Respiratory complications will improve Outcome: Progressing Goal: Cardiovascular complication will be avoided Outcome: Progressing   Problem: Activity: Goal: Risk for activity intolerance will decrease Outcome: Progressing   Problem: Elimination: Goal: Will not experience complications related to urinary retention Outcome: Progressing   Problem: Pain Managment: Goal: General experience of comfort will improve Outcome: Progressing

## 2022-06-22 NOTE — Anesthesia Procedure Notes (Signed)
Arterial Line Insertion Start/End12/22/2023 8:40 AM, 06/22/2022 9:05 AM Performed by: Suzette Battiest, MD, anesthesiologist  Patient location: Pre-op. Preanesthetic checklist: patient identified, IV checked, site marked, risks and benefits discussed, surgical consent, monitors and equipment checked, pre-op evaluation, timeout performed and anesthesia consent Lidocaine 1% used for infiltration Right, radial was placed Catheter size: 20 G Hand hygiene performed  and maximum sterile barriers used   Attempts: 4 Procedure performed using ultrasound guided technique. Ultrasound Notes:anatomy identified, needle tip was noted to be adjacent to the nerve/plexus identified, no ultrasound evidence of intravascular and/or intraneural injection and image(s) printed for medical record Following insertion, dressing applied and Biopatch. Post procedure assessment: normal and unchanged  Post procedure complications: unsuccessful attempts. Patient tolerated the procedure well with no immediate complications.

## 2022-06-22 NOTE — Progress Notes (Signed)
LVAD Coordinator Rounding Note:  Admitted 06/21/22 to Dr. Haroldine Laws due to need for nasal artery embolization in IR.   HM II LVAD implanted on 09/21/14 by Dr. Darcey Nora under Destination Therapy criteria.  Met pt in SS this morning. Accompanied pt to IR procedure. Pt spent most of the in PACU. Nasal packing removed by Dr Haroldine Laws in PACU.   Pt tolerated procedure well today. Pt was transferred to [facility] per Dr Estanislado Pandy for overnight close observation.  Vital signs: Temp: 97.5 HR: AV paced 80 Doppler Pressure: 82 Automatic BP: 100/84 (91) Art. BP: 103/89 (95) O2 Sat: 94% on RA Wt: 184.3>182.1 lbs    LVAD interrogation reveals:  Speed: 9200 Flow: 4.2 Power: 5w PI: 5.2  Alarms: none Events: none  Fixed speed: 9200 Low speed limit: 8600   Drive Line:  Dressing clean, dry, and intact. Anchor correctly applied. Wife says she wants to do dressing changes at this time. Dressing change per bedside RN or patient's wife [name]. Dressing change due 06/28/22 .   Labs:  LDH trend: 246>220  INR trend: 1.6>1.5  Anticoagulation Plan: -INR Goal:  2.0 - 3.0 -ASA Dose: 81 mg daily--on hold with nosebleeds -Plavix: on hold with nosebleeds -Heparin 800 u/hr  Blood Products:    Device: N/A  Arrythmias:  - 5/12//23 admitted with VT>>emergent DC-CV  - 11/10/21 complete heart block/bradycardia >>started on Dopamine gtt - 5/15 Recurrent VT -> DC-CV - 5/16 Recurrent VT-->DC-CV/ SP Dual Chamber MDT ICD   Drips: Heparin 800 units/hr   Plan/Recommendations:  Call VAD pager if any issues with VAD equipment or drive line site. Weekly dressing changes per pt's wife or bedside RN.   [name] RN Gateway Coordinator  Office: [phone]  24/7 Pager: [phone]

## 2022-06-22 NOTE — Procedures (Signed)
INR.   Status post bilateral common carotid arteriograms, and bilateral selective external carotid arteriograms.  Right CFA approach. Findings. Abnormal prominent tortuous bilateral internal maxillary artery and sphenopalatine branches.  Status post Selective embolization of bilateral internal maxillary artery branches, and septal branches of the facial arteries using PVA particles of status 250-350, and 350 -550 m with stasis. 8French Angio-Seal closure device deployed for hemostasis at theof the right groin puncture site.  Distal pulses unchanged .dopplerable bilaterally.   Extubated.   Upon recovery alert awake and appropriately responsive.  Speech and comprehension intact.  Pupils 3 mm sluggish bilaterally.  No facial asymmetry.  Tongue midline.  Moves all fours equally.  Arlean Hopping MD

## 2022-06-23 DIAGNOSIS — R040 Epistaxis: Secondary | ICD-10-CM | POA: Diagnosis not present

## 2022-06-23 LAB — CBC WITH DIFFERENTIAL/PLATELET
Abs Immature Granulocytes: 0.05 10*3/uL (ref 0.00–0.07)
Abs Immature Granulocytes: 0.08 10*3/uL — ABNORMAL HIGH (ref 0.00–0.07)
Abs Immature Granulocytes: 0.07 10*3/uL (ref 0.00–0.07)
Basophils Absolute: 0 10*3/uL (ref 0.0–0.1)
Basophils Absolute: 0 10*3/uL (ref 0.0–0.1)
Basophils Absolute: 0 10*3/uL (ref 0.0–0.1)
Basophils Relative: 0 %
Basophils Relative: 0 %
Basophils Relative: 0 %
Eosinophils Absolute: 0 10*3/uL (ref 0.0–0.5)
Eosinophils Absolute: 0 10*3/uL (ref 0.0–0.5)
Eosinophils Absolute: 0 10*3/uL (ref 0.0–0.5)
Eosinophils Relative: 0 %
Eosinophils Relative: 0 %
Eosinophils Relative: 0 %
HCT: 31 % — ABNORMAL LOW (ref 39.0–52.0)
HCT: 36.8 % — ABNORMAL LOW (ref 39.0–52.0)
HCT: 38.7 % — ABNORMAL LOW (ref 39.0–52.0)
Hemoglobin: 9.6 g/dL — ABNORMAL LOW (ref 13.0–17.0)
Hemoglobin: 11.5 g/dL — ABNORMAL LOW (ref 13.0–17.0)
Hemoglobin: 12.3 g/dL — ABNORMAL LOW (ref 13.0–17.0)
Immature Granulocytes: 1 %
Immature Granulocytes: 1 %
Immature Granulocytes: 1 %
Lymphocytes Relative: 2 %
Lymphocytes Relative: 2 %
Lymphocytes Relative: 2 %
Lymphs Abs: 0.2 10*3/uL — ABNORMAL LOW (ref 0.7–4.0)
Lymphs Abs: 0.2 10*3/uL — ABNORMAL LOW (ref 0.7–4.0)
Lymphs Abs: 0.2 10*3/uL — ABNORMAL LOW (ref 0.7–4.0)
MCH: 32.2 pg (ref 26.0–34.0)
MCH: 32.1 pg (ref 26.0–34.0)
MCH: 32.6 pg (ref 26.0–34.0)
MCHC: 31 g/dL (ref 30.0–36.0)
MCHC: 31.3 g/dL (ref 30.0–36.0)
MCHC: 31.8 g/dL (ref 30.0–36.0)
MCV: 104 fL — ABNORMAL HIGH (ref 80.0–100.0)
MCV: 102.8 fL — ABNORMAL HIGH (ref 80.0–100.0)
MCV: 102.7 fL — ABNORMAL HIGH (ref 80.0–100.0)
Monocytes Absolute: 0.7 10*3/uL (ref 0.1–1.0)
Monocytes Absolute: 0.8 10*3/uL (ref 0.1–1.0)
Monocytes Absolute: 0.6 10*3/uL (ref 0.1–1.0)
Monocytes Relative: 8 %
Monocytes Relative: 7 %
Monocytes Relative: 6 %
Neutro Abs: 7.8 10*3/uL — ABNORMAL HIGH (ref 1.7–7.7)
Neutro Abs: 10 10*3/uL — ABNORMAL HIGH (ref 1.7–7.7)
Neutro Abs: 9.6 10*3/uL — ABNORMAL HIGH (ref 1.7–7.7)
Neutrophils Relative %: 89 %
Neutrophils Relative %: 90 %
Neutrophils Relative %: 91 %
Platelets: 184 10*3/uL (ref 150–400)
Platelets: 252 10*3/uL (ref 150–400)
Platelets: 253 10*3/uL (ref 150–400)
RBC: 2.98 MIL/uL — ABNORMAL LOW (ref 4.22–5.81)
RBC: 3.58 MIL/uL — ABNORMAL LOW (ref 4.22–5.81)
RBC: 3.77 MIL/uL — ABNORMAL LOW (ref 4.22–5.81)
RDW: 15.7 % — ABNORMAL HIGH (ref 11.5–15.5)
RDW: 15.9 % — ABNORMAL HIGH (ref 11.5–15.5)
RDW: 15.6 % — ABNORMAL HIGH (ref 11.5–15.5)
WBC: 8.7 10*3/uL (ref 4.0–10.5)
WBC: 11.1 10*3/uL — ABNORMAL HIGH (ref 4.0–10.5)
WBC: 10.4 10*3/uL (ref 4.0–10.5)
nRBC: 0 % (ref 0.0–0.2)
nRBC: 0 % (ref 0.0–0.2)
nRBC: 0 % (ref 0.0–0.2)

## 2022-06-23 LAB — BASIC METABOLIC PANEL
Anion gap: 9 (ref 5–15)
Anion gap: 10 (ref 5–15)
BUN: 15 mg/dL (ref 8–23)
BUN: 17 mg/dL (ref 8–23)
CO2: 23 mmol/L (ref 22–32)
CO2: 23 mmol/L (ref 22–32)
Calcium: 8.8 mg/dL — ABNORMAL LOW (ref 8.9–10.3)
Calcium: 8.9 mg/dL (ref 8.9–10.3)
Chloride: 104 mmol/L (ref 98–111)
Chloride: 102 mmol/L (ref 98–111)
Creatinine, Ser: 1 mg/dL (ref 0.61–1.24)
Creatinine, Ser: 1.16 mg/dL (ref 0.61–1.24)
GFR, Estimated: >60 mL/min (ref >60)
GFR, Estimated: >60 mL/min (ref >60)
Glucose, Bld: 139 mg/dL — ABNORMAL HIGH (ref 70–99)
Glucose, Bld: 164 mg/dL — ABNORMAL HIGH (ref 70–99)
Potassium: 4.6 mmol/L (ref 3.5–5.1)
Potassium: 4.8 mmol/L (ref 3.5–5.1)
Sodium: 136 mmol/L (ref 135–145)
Sodium: 135 mmol/L (ref 135–145)

## 2022-06-23 LAB — PROTIME-INR
INR: 1.4 — ABNORMAL HIGH (ref 0.8–1.2)
INR: 1.4 — ABNORMAL HIGH (ref 0.8–1.2)
Prothrombin Time: 16.9 seconds — ABNORMAL HIGH (ref 11.4–15.2)
Prothrombin Time: 16.9 seconds — ABNORMAL HIGH (ref 11.4–15.2)

## 2022-06-23 LAB — LACTATE DEHYDROGENASE
LDH: 220 U/L — ABNORMAL HIGH (ref 98–192)
LDH: 227 U/L — ABNORMAL HIGH (ref 98–192)

## 2022-06-23 LAB — HEPARIN LEVEL (UNFRACTIONATED)
Heparin Unfractionated: 0.1 IU/mL — ABNORMAL LOW (ref 0.30–0.70)
Heparin Unfractionated: <0.1 IU/mL — ABNORMAL LOW (ref 0.30–0.70)

## 2022-06-23 LAB — GLUCOSE, CAPILLARY: Glucose-Capillary: 123 mg/dL — ABNORMAL HIGH (ref 70–99)

## 2022-06-23 MED ORDER — FUROSEMIDE 10 MG/ML IJ SOLN: 40.0000 mg | Freq: Once | INTRAMUSCULAR | Status: AC

## 2022-06-23 MED ORDER — WARFARIN SODIUM 3 MG PO TABS
6.0000 mg | ORAL_TABLET | Freq: Once | ORAL | Status: AC
  Administered 2022-06-23: 6 mg via ORAL
  Filled 2022-06-23: qty 2

## 2022-06-23 MED ORDER — FUROSEMIDE 10 MG/ML IJ SOLN
40.0000 mg | Freq: Once | INTRAMUSCULAR | Status: AC
  Administered 2022-06-23: 40 mg via INTRAVENOUS
  Filled 2022-06-23: qty 4

## 2022-06-23 MED ORDER — WARFARIN - PHARMACIST DOSING INPATIENT: Freq: Every day | Status: DC

## 2022-06-23 NOTE — Progress Notes (Signed)
Referring Physician(s): Bensimhon,Claude R  Supervising Physician: Luanne Bras  Patient Status:  [facility] - In-pt  Chief Complaint:  Epistaxis, refractory  S/p bilateral nasal artery embolization by Dr. Estanislado Pandy on 06/22/22.   Subjective:  Patient sitting in a chair, spouse and RN at bedside.  Reports no further nose bleed, he is feeling fine today.  Denies SOB, light headedness, fatigue.   Allergies: Biopatch [chlorhexidine], Desyrel [trazodone], and Other  Medications: Prior to Admission medications   Medication Sig Start Date End Date Taking? Authorizing Provider  amiodarone (PACERONE) 200 MG tablet Take 0.5 tablets (100 mg total) by mouth daily. 02/23/22  Yes Camnitz, Will Hassell Done, MD  cephALEXin (KEFLEX) 500 MG capsule Take 1 capsule (500 mg total) by mouth 2 (two) times daily. Patient taking differently: Take 500 mg by mouth 3 (three) times daily. 11/21/21  Yes Bensimhon, Shaune Pascal, MD  clobetasol ointment (TEMOVATE) 0.37 % Apply 1 application topically 2 (two) times daily. Patient taking differently: Apply 1 application  topically 2 (two) times daily as needed (skin irritation/dermatitis (around dressing area)). 03/15/21  Yes Bensimhon, Shaune Pascal, MD  clopidogrel (PLAVIX) 75 MG tablet Take 1 tablet (75 mg total) by mouth daily. 11/19/21  Yes Clegg, Amy D, NP  furosemide (LASIX) 20 MG tablet Take 2 tablets (40 mg total) by mouth daily. Take 40 mg daily Patient taking differently: Take 20 mg by mouth daily. Take 1 tablet (20 mg) by mouth alternating with taking 2 tablets (40 mg) by mouth every other day cyclically. 11/17/21  Yes Clegg, Amy D, NP  pantoprazole (PROTONIX) 40 MG tablet TAKE 1 TABLET (40 MG TOTAL) BY MOUTH TWICE A DAY BEFORE MEALS Patient taking differently: 40 mg daily before supper. 02/19/22  Yes Noralyn Pick, NP  potassium chloride SA (KLOR-CON M) 20 MEQ tablet Take 2 tablets (40 mEq total) by mouth daily. 20 mEq daily WITH furosemide (take 40 mEq if  taking 40 mg of furosemide) Patient taking differently: Take 20 mEq by mouth daily. Take 1 tablet (20 meq) by mouth alternating with taking 2 tablets (40 meq) by mouth every other day cyclically. 04/16/22  Yes Bensimhon, Shaune Pascal, MD  rosuvastatin (CRESTOR) 10 MG tablet Take 1 tablet (10 mg total) by mouth every Monday, Wednesday, and Friday. 02/28/22  Yes Bensimhon, Shaune Pascal, MD  warfarin (COUMADIN) 6 MG tablet TAKE 1 TABLET BY MOUTH EVERY DAY Patient taking differently: Take 3-6 mg by mouth See admin instructions. Take 1 tablet (6 mg) by mouth in the evening on Mondays & Fridays.  Take 0.5 tablet (3 mg) by mouth in the evening on Sundays, Tuesdays, Wednesdays, Thursdays & Saturdays. 12/14/21  Yes Bensimhon, Shaune Pascal, MD  acetaminophen (TYLENOL) 500 MG tablet Take 1,000 mg by mouth every 6 (six) hours as needed for moderate pain.     [provider]  cyclobenzaprine (FLEXERIL) 5 MG tablet Take 1 tablet (5 mg total) by mouth 3 (three) times daily as needed for muscle spasms. 06/30/20   Bensimhon, Shaune Pascal, MD  traMADol (ULTRAM) 50 MG tablet Take 2 tablets (100 mg total) by mouth every 6 (six) hours as needed for moderate pain. Patient taking differently: Take 50-100 mg by mouth daily as needed for moderate pain. 08/30/20   Bensimhon, Shaune Pascal, MD  traZODone (DESYREL) 50 MG tablet Take 50 mg by mouth at bedtime as needed for sleep.    [provider]     Vital Signs: BP (!) 85/76 (BP Location: Left Arm)   Pulse  80   Temp (!) 97.5 F (36.4 C) (Oral)   Resp (!) 22   Ht '5\' 10"'$  (1.778 m)   Wt 189 lb 11.2 oz (86 kg)   SpO2 95%   BMI 27.22 kg/m   Physical Exam Vitals reviewed.  Constitutional:      General: He is not in acute distress.    Appearance: He is not ill-appearing.  HENT:     Head: Normocephalic.  Pulmonary:     Effort: Pulmonary effort is normal.  Musculoskeletal:     Right lower leg: Edema present.     Left lower leg: Edema present.  Skin:    General: Skin is  warm and dry.     Comments: Bilateral feet cold, per spouse patient has cold feet in baseline.  Unable to doppler DP/PT bilaterally, cap refill good.  Positive dressing on R CFA puncture site. Site is unremarkable with no erythema, edema, tenderness, bleeding or drainage. Minimal amount of old, dry blood noted on the dressing. Dressing otherwise clean, dry, and intact.    Neurological:     Mental Status: He is alert and oriented to person, place, and time.  Psychiatric:        Mood and Affect: Mood normal.        Behavior: Behavior normal.     Imaging: No results found.  Labs:  CBC: Recent Labs    06/22/22 0458 06/23/22 0453 06/23/22 0619 06/23/22 0700  WBC 8.7 8.7 11.1* 10.4  HGB 12.4* 9.6* 11.5* 12.3*  HCT 37.4* 31.0* 36.8* 38.7*  PLT 214 184 252 253    COAGS: Recent Labs    06/22/22 0013 06/22/22 0458 06/23/22 0619 06/23/22 0700  INR 1.6* 1.5* 1.4* 1.4*    BMP: Recent Labs    06/22/22 0013 06/22/22 0458 06/23/22 0619 06/23/22 0700  NA 140 140 136 135  K 4.0 4.1 4.6 4.8  CL 106 105 104 102  CO2 '27 26 23 23  '$ GLUCOSE 144* 95 139* 164*  BUN '14 13 15 17  '$ CALCIUM 8.7* 9.1 8.8* 8.9  CREATININE 1.01 1.06 1.00 1.16  GFRNONAA >60 >60 >60 >60    LIVER FUNCTION TESTS: Recent Labs    04/16/22 0913 05/31/22 2351 06/21/22 1826 06/22/22 0458  BILITOT 1.2 1.8* 1.4* 1.7*  AST 34 36 28 28  ALT '19 18 17 16  '$ ALKPHOS 142* 151* 151* 152*  PROT 8.4* 9.3* 7.0 7.1  ALBUMIN 4.3 4.1 3.3* 3.3*    Assessment and Plan:  76 y.o. male with CHF s/p  HMII LVAD 09/20/13 on coumadin who presented with refractory epistaxis, he is s/p bilateral nasal artery embolization by Dr. Estanislado Pandy on 06/22/22.   No further epistaxis, hgb 11.9>.12.4>>9.6 today  VSS, persistent hypotension, on LVAD.  R CFA site w/o appreciable pseudoaneurysm.  Bilateral DP/PT was not found by doppler while patient in sitting position - informed Dr. Estanislado Pandy.  Patient and spouse said [facility] team was  using doppler last night. Spoke with RN, she will reassess when patient in supine position.   Further treatment plan per heart failure team.  Appreciate and agree with the plan.  Please call NIR for questions and concerns.    Electronically Signed: Tera Mater, PA-C 06/23/2022, 12:43 PM   I spent a total of 15 Minutes at the the patient's bedside AND on the patient's hospital floor or unit, greater than 50% of which was counseling/coordinating care for bilateral nasal artery embolization.   This chart was dictated using voice recognition software.  Despite best efforts to proofread,  errors can occur which can change the documentation meaning.

## 2022-06-23 NOTE — Progress Notes (Signed)
Advanced Heart Failure VAD Team Note  PCP-Cardiologist: None   Subjective:    Underwent bilateral nasal artery embolization yesterday (12/22). Nasal packing removed  Back on heparin @ 800/hr. No further bleeding. HL undetectable  Denies CP or SOB. MAPs ok   LDH 227  INR 1.4   LVAD INTERROGATION:  HeartMate II LVAD:   Flow 3.9 liters/min, speed 9200, power 5.0, PI 4.6    Objective:    Vital Signs:   Temp:  [97.5 F (36.4 C)-98.2 F (36.8 C)] 97.5 F (36.4 C) (12/23 0500) Pulse Rate:  [77-190] 190 (12/23 0700) Resp:  [11-26] 20 (12/23 0700) BP: (87-100)/(46-88) 90/58 (12/23 0600) SpO2:  [83 %-96 %] 83 % (12/23 0700) Arterial Line BP: (79-119)/(73-110) 95/81 (12/23 0700) Weight:  [86 kg] 86 kg (12/23 0500) Last BM Date : 06/23/22 Mean arterial Pressure 80s-90s  Intake/Output:   Intake/Output Summary (Last 24 hours) at 06/23/2022 0906 Last data filed at 06/23/2022 0700 Gross per 24 hour  Intake 1907.13 ml  Output 475 ml  Net 1432.13 ml      Physical Exam    General:  NAD.  HEENT: normal  Neck: supple. JVP not elevated.  Carotids 2+ bilat; no bruits. No lymphadenopathy or thryomegaly appreciated. Cor: LVAD hum.  Lungs: Clear. Abdomen: soft, nontender, non-distended. No hepatosplenomegaly. No bruits or masses. Good bowel sounds. Driveline site clean. Anchor in place.  Extremities: no cyanosis, clubbing, rash. 1+ edema  Neuro: alert & oriented x 3. No focal deficits. Moves all 4 without problem    Telemetry   AV paced, 80s Personally reviewed  Labs   Basic Metabolic Panel: Recent Labs  Lab 06/21/22 1826 06/22/22 0013 06/22/22 0458 06/23/22 0619 06/23/22 0700  NA 137 140 140 136 135  K 4.1 4.0 4.1 4.6 4.8  CL 105 106 105 104 102  CO2 '25 27 26 23 23  '$ GLUCOSE 121* 144* 95 139* 164*  BUN '15 14 13 15 17  '$ CREATININE 1.07 1.01 1.06 1.00 1.16  CALCIUM 9.0 8.7* 9.1 8.8* 8.9     Liver Function Tests: Recent Labs  Lab 06/21/22 1826  06/22/22 0458  AST 28 28  ALT 17 16  ALKPHOS 151* 152*  BILITOT 1.4* 1.7*  PROT 7.0 7.1  ALBUMIN 3.3* 3.3*    No results for input(s): "LIPASE", "AMYLASE" in the last 168 hours. No results for input(s): "AMMONIA" in the last 168 hours.  CBC: Recent Labs  Lab 06/21/22 1826 06/22/22 0013 06/22/22 0458 06/23/22 0453 06/23/22 0619 06/23/22 0700  WBC 9.7 8.4 8.7 8.7 11.1* 10.4  NEUTROABS 7.7  --  6.8 7.8* 10.0* 9.6*  HGB 11.9* 11.9* 12.4* 9.6* 11.5* 12.3*  HCT 35.9* 36.2* 37.4* 31.0* 36.8* 38.7*  MCV 99.4 99.7 100.0 104.0* 102.8* 102.7*  PLT 205 215 214 184 252 253     INR: Recent Labs  Lab 06/21/22 1252 06/22/22 0013 06/22/22 0458 06/23/22 0619 06/23/22 0700  INR 1.6* 1.6* 1.5* 1.4* 1.4*     Other results:   Imaging   No results found.   Medications:     Scheduled Medications:  amiodarone  100 mg Oral Daily   cephALEXin  500 mg Oral TID   clopidogrel  75 mg Oral Daily   erythromycin   Left Eye Q8H   pantoprazole  40 mg Oral Daily   potassium chloride  20 mEq Oral Daily   rosuvastatin  10 mg Oral Q M,W,F    Infusions:  sodium chloride Stopped (06/22/22 1842)   heparin  800 Units/hr (06/23/22 0700)    PRN Medications: acetaminophen **OR** acetaminophen (TYLENOL) oral liquid 160 mg/5 mL **OR** acetaminophen, acetaminophen, cyclobenzaprine, iohexol, ondansetron (ZOFRAN) IV, mouth rinse, traMADol, traZODone   Patient Profile    76 y/o male with h/o CAD with ankylosing spondylitis, DM2, carotid stenting, severe ischemic CM s/p anterior STEM with VT arrest in 12/15, large PE with pulmonary infarct in 2/15 s/p IVC filter and systolic HF with EF 31% and recent refractory epistaxis.  Admitted for planned nasal artery embolization and management of anticoagulation.  Assessment/Plan:     1. Epistaxis, refractory - has failed management with nasal packing and ENT - s/p bilateral nasal artery embolization by IR 12/22 - No further bleeding. Hgb  stable - On heparin at 800u/hr.  - INR 1.4 -> restart warfarin. Discussed dosing with PharmD personally.   2. Chronic systolic HF: Ischemic cardiomyopathy, EF 20% s/p HMII LVAD 09/20/13.   - Stable NYHA II - MAPs ok - Volume up. Lasix 40 IV x 1   3. Esophageal stricture - symptomatically improved with cutting food up - Barium swallow 7/23 suggestive of stricture at [place] + esophageal dysmotility - has seen GI. Discussed EGD but he wants to defer for now as symptoms improved - reminded him to be vigilant about cutting/chewing his food well  - No change   4. VT/VF c/b CHB - much improved with CRT-D with LBBB lead 5/23 - no further VT - Continue amio at 200 daily - keep K > 4.0. Mg > 2.0   5. CAD s/p Anterior STEMI on 06/12/14 with stenting of LAD:  - No s/s ischemia - ASA on hold with epistaxis. Resume later - Previously stopped statin but now back on crestor 3x/week. No change   6. DL infection, chronic - Site looks good  - Remains on suppressive Keflex - Continue weekly dressing changes   7. VAD management:  - Had admit in 08/2017 for elevated LDH.  - Off asa with recent epistaxis. Holding coumadin for planned procedure.  - Goal INR 2.0-2.5. INR 1.4 this am.  - LDH ok - On heparin at 800u/hr.  - INR 1.4 -> restart warfarin - Continue Keflex as above for DL - VAD interrogated personally. Parameters stable.   8. Ankylosing spondylitis: Off NSAIDS. -  No longer following with rheumatology. Uses tramadol. Avoids NSAIDs as much as possible. -  No change   9. HTN:  - Keep MAP 70-90.    10.  Kidney Stones  - CT with renal calculi bilaterally. Had some previous hematuria. None recently  Now followed by Dr Tresa Moore at [facility].  11. L eye drainage - Continue erythromycin ointment  Transfer to [place]   I reviewed the LVAD parameters from today, and compared the results to the patient's prior recorded data.  No programming changes were made.  The LVAD is  functioning within specified parameters.  The patient performs LVAD self-test daily.  LVAD interrogation was negative for any significant power changes, alarms or PI events/speed drops.  LVAD equipment check completed and is in good working order.  Back-up equipment present.   LVAD education done on emergency procedures and precautions and reviewed exit site care.  Length of Stay: 2  [name], MD 06/23/2022, 9:06 AM  VAD Team --- VAD ISSUES ONLY--- Pager [phone] (7am - 7am)  Advanced Heart Failure Team  Pager [phone] (M-F; 7a - 5p)  Please contact [facility] for night-coverage after hours (5p -7a ) and weekends on [email or web address]

## 2022-06-23 NOTE — Progress Notes (Addendum)
ANTICOAGULATION CONSULT NOTE - Follow-up Consult  Pharmacy Consult for heparin, warfarin Indication:  LVAD, history of PE  Allergies  Allergen Reactions   Biopatch [Chlorhexidine] Dermatitis and Rash   Desyrel [Trazodone] Other (See Comments)    insomnia   Other Dermatitis    Adhesive    Patient Measurements: Height: '5\' 10"'$  (177.8 cm) Weight: 86 kg (189 lb 11.2 oz) IBW/kg (Calculated) : 73 Heparin Dosing Weight: 84kg  Vital Signs: Temp: 97.5 F (36.4 C) (12/23 0500) Temp Source: Oral (12/23 0500) BP: 90/58 (12/23 0600) Pulse Rate: 190 (12/23 0700)  Labs: Recent Labs    06/22/22 0013 06/22/22 0458 06/23/22 0453 06/23/22 0619 06/23/22 0700  HGB 11.9* 12.4* 9.6* 11.5* 12.3*  HCT 36.2* 37.4* 31.0* 36.8* 38.7*  PLT 215 214 184 252 253  LABPROT 19.1* 18.1*  --  16.9* 16.9*  INR 1.6* 1.5*  --  1.4* 1.4*  HEPARINUNFRC <0.10*  --   --  0.10* <0.10*  CREATININE 1.01 1.06  --  1.00 1.16     Estimated Creatinine Clearance: 57.7 mL/min (by C-G formula based on SCr of 1.16 mg/dL).  Assessment: 76 year old male with HM2 LVAD presents to [facility] as a direct admit for heparin bridge now s/p nasal artery embolization tomorrow 12/22. Plavix resumed and nasal packing removed  -INR 1.4. Warfarin has been on hold for the past couple days and plans to resume today -Heparin level undetectable this morning as expected.   Home warfarin dose: '3mg'$ /day except take '6mg'$  on MF  Goal of Therapy:  INR goal 2-2.5 Heparin level < 0.3 Monitor platelets by anticoagulation protocol: Yes   Plan:  -Continue heparin at 800 units/hr -Daily heparin level and CBC -Warfarin '6mg'$  today -Daily PT/INR  Hildred Laser, PharmD Clinical Pharmacist **Pharmacist phone directory can now be found on [email or web address] ([email or web address]).  Listed under [place].

## 2022-06-24 DIAGNOSIS — R040 Epistaxis: Secondary | ICD-10-CM | POA: Diagnosis not present

## 2022-06-24 LAB — CBC
HCT: 34.8 % — ABNORMAL LOW (ref 39.0–52.0)
Hemoglobin: 11.4 g/dL — ABNORMAL LOW (ref 13.0–17.0)
MCH: 32.5 pg (ref 26.0–34.0)
MCHC: 32.8 g/dL (ref 30.0–36.0)
MCV: 99.1 fL (ref 80.0–100.0)
Platelets: 221 10*3/uL (ref 150–400)
RBC: 3.51 MIL/uL — ABNORMAL LOW (ref 4.22–5.81)
RDW: 15.7 % — ABNORMAL HIGH (ref 11.5–15.5)
WBC: 15.1 10*3/uL — ABNORMAL HIGH (ref 4.0–10.5)
nRBC: 0 % (ref 0.0–0.2)

## 2022-06-24 LAB — HEPARIN LEVEL (UNFRACTIONATED): Heparin Unfractionated: <0.1 IU/mL — ABNORMAL LOW (ref 0.30–0.70)

## 2022-06-24 LAB — BASIC METABOLIC PANEL
Anion gap: 9 (ref 5–15)
BUN: 22 mg/dL (ref 8–23)
CO2: 25 mmol/L (ref 22–32)
Calcium: 8.9 mg/dL (ref 8.9–10.3)
Chloride: 103 mmol/L (ref 98–111)
Creatinine, Ser: 1.09 mg/dL (ref 0.61–1.24)
GFR, Estimated: >60 mL/min (ref >60)
Glucose, Bld: 115 mg/dL — ABNORMAL HIGH (ref 70–99)
Potassium: 4.4 mmol/L (ref 3.5–5.1)
Sodium: 137 mmol/L (ref 135–145)

## 2022-06-24 LAB — PROTIME-INR
INR: 1.3 — ABNORMAL HIGH (ref 0.8–1.2)
Prothrombin Time: 16.3 seconds — ABNORMAL HIGH (ref 11.4–15.2)

## 2022-06-24 LAB — LACTATE DEHYDROGENASE: LDH: 219 U/L — ABNORMAL HIGH (ref 98–192)

## 2022-06-24 MED ORDER — WARFARIN SODIUM 3 MG PO TABS
6.0000 mg | ORAL_TABLET | Freq: Once | ORAL | Status: AC
  Administered 2022-06-24: 6 mg via ORAL
  Filled 2022-06-24: qty 2

## 2022-06-24 NOTE — Plan of Care (Signed)
  Problem: Education: Goal: Knowledge of General Education information will improve Description: Including pain rating scale, medication(s)/side effects and non-pharmacologic comfort measures Outcome: Progressing   Problem: Health Behavior/Discharge Planning: Goal: Ability to manage health-related needs will improve Outcome: Progressing   Problem: Clinical Measurements: Goal: Ability to maintain clinical measurements within normal limits will improve Outcome: Progressing Goal: Will remain free from infection Outcome: Progressing Goal: Diagnostic test results will improve Outcome: Progressing Goal: Respiratory complications will improve Outcome: Progressing Goal: Cardiovascular complication will be avoided Outcome: Progressing   Problem: Activity: Goal: Risk for activity intolerance will decrease Outcome: Progressing   Problem: Nutrition: Goal: Adequate nutrition will be maintained Outcome: Progressing   Problem: Coping: Goal: Level of anxiety will decrease Outcome: Progressing   Problem: Elimination: Goal: Will not experience complications related to bowel motility Outcome: Progressing Goal: Will not experience complications related to urinary retention Outcome: Progressing   Problem: Pain Managment: Goal: General experience of comfort will improve Outcome: Progressing   Problem: Safety: Goal: Ability to remain free from injury will improve Outcome: Progressing   Problem: Skin Integrity: Goal: Risk for impaired skin integrity will decrease Outcome: Progressing   Problem: Education: Goal: Patient will understand all VAD equipment and how it functions Outcome: Progressing Goal: Patient will be able to verbalize current INR target range and antiplatelet therapy for discharge home Outcome: Progressing   Problem: Cardiac: Goal: LVAD will function as expected and patient will experience no clinical alarms Outcome: Progressing   Problem: Education: Goal:  Understanding of CV disease, CV risk reduction, and recovery process will improve Outcome: Progressing Goal: Individualized Educational Video(s) Outcome: Progressing   Problem: Activity: Goal: Ability to return to baseline activity level will improve Outcome: Progressing   Problem: Cardiovascular: Goal: Ability to achieve and maintain adequate cardiovascular perfusion will improve Outcome: Progressing Goal: Vascular access site(s) Level 0-1 will be maintained Outcome: Progressing   Problem: Health Behavior/Discharge Planning: Goal: Ability to safely manage health-related needs after discharge will improve Outcome: Progressing

## 2022-06-24 NOTE — Progress Notes (Signed)
ANTICOAGULATION CONSULT NOTE - Follow-up Consult  Pharmacy Consult for heparin, warfarin Indication:  LVAD, history of PE  Allergies  Allergen Reactions   Biopatch [Chlorhexidine] Dermatitis and Rash   Desyrel [Trazodone] Other (See Comments)    insomnia   Other Dermatitis    Adhesive    Patient Measurements: Height: '5\' 10"'$  (177.8 cm) Weight: 83.6 kg (184 lb 6.4 oz) IBW/kg (Calculated) : 73 Heparin Dosing Weight: 84kg  Vital Signs: Temp: 97.5 F (36.4 C) (12/24 0801) Temp Source: Oral (12/24 0801) BP: 100/86 (12/24 0801) Pulse Rate: 71 (12/24 0801)  Labs: Recent Labs    06/23/22 0619 06/23/22 0700 06/24/22 0010  HGB 11.5* 12.3* 11.4*  HCT 36.8* 38.7* 34.8*  PLT 252 253 221  LABPROT 16.9* 16.9* 16.3*  INR 1.4* 1.4* 1.3*  HEPARINUNFRC 0.10* <0.10* <0.10*  CREATININE 1.00 1.16 1.09     Estimated Creatinine Clearance: 61.4 mL/min (by C-G formula based on SCr of 1.09 mg/dL).  Assessment: 76 year old male with HM2 LVAD presents to [facility] as a direct admit for heparin bridge now s/p nasal artery embolization tomorrow 12/22. Plavix resumed and nasal packing removed  -INR 1.3. Warfarin has been on hold for the past couple days and plans to resume today -Heparin level undetectable this morning as expected.   Home warfarin dose: '3mg'$ /day except take '6mg'$  on MF  Goal of Therapy:  INR goal 2-2.5 Heparin level < 0.3 Monitor platelets by anticoagulation protocol: Yes   Plan:  -Continue heparin at 800 units/hr -Daily heparin level and CBC -Warfarin '6mg'$  today -Daily PT/INR  Hildred Laser, PharmD Clinical Pharmacist **Pharmacist phone directory can now be found on [email or web address] ([email or web address]).  Listed under [place].

## 2022-06-24 NOTE — Progress Notes (Signed)
Advanced Heart Failure VAD Team Note  PCP-Cardiologist: None   Subjective:    Underwent bilateral nasal artery embolization (12/22). Nasal packing removed  Back on heparin @ 800/hr. No further bleeding. HL undetectable  Diuresed with IV lasix yesterday. Weight down 5 pounds. No CP or SOB  LDH 219  INR 1.3   LVAD INTERROGATION:  HeartMate II LVAD:   Flow 5.7 liters/min, speed 9200, power 5.0, PI 3.7   Objective:    Vital Signs:   Temp:  [97.3 F (36.3 C)-97.9 F (36.6 C)] 97.5 F (36.4 C) (12/24 0801) Pulse Rate:  [70-100] 71 (12/24 0801) Resp:  [15-22] 17 (12/24 0801) BP: (88-105)/(76-86) 100/86 (12/24 0801) SpO2:  [94 %-96 %] 96 % (12/24 0541) Arterial Line BP: (86-88)/(76-79) 88/79 (12/23 1100) Weight:  [83.6 kg] 83.6 kg (12/24 0536) Last BM Date : 06/23/22 Mean arterial Pressure 80-90s   Intake/Output:   Intake/Output Summary (Last 24 hours) at 06/24/2022 0922 Last data filed at 06/24/2022 0800 Gross per 24 hour  Intake 541.99 ml  Output 2470 ml  Net -1928.01 ml      Physical Exam    General:  NAD.  HEENT: normal  Neck: supple. JVP 7-8  Carotids 2+ bilat; no bruits. No lymphadenopathy or thryomegaly appreciated. Cor: LVAD hum.  Lungs: Clear. Abdomen: soft, nontender, non-distended. No hepatosplenomegaly. No bruits or masses. Good bowel sounds. Driveline site clean. Anchor in place.  Extremities: no cyanosis, clubbing, rash. Warm trace edema  Neuro: alert & oriented x 3. No focal deficits. Moves all 4 without problem     Telemetry   AV paced, 80s Personally reviewed  Labs   Basic Metabolic Panel: Recent Labs  Lab 06/22/22 0013 06/22/22 0458 06/23/22 0619 06/23/22 0700 06/24/22 0010  NA 140 140 136 135 137  K 4.0 4.1 4.6 4.8 4.4  CL 106 105 104 102 103  CO2 '27 26 23 23 25  '$ GLUCOSE 144* 95 139* 164* 115*  BUN '14 13 15 17 22  '$ CREATININE 1.01 1.06 1.00 1.16 1.09  CALCIUM 8.7* 9.1 8.8* 8.9 8.9     Liver Function Tests: Recent Labs   Lab 06/21/22 1826 06/22/22 0458  AST 28 28  ALT 17 16  ALKPHOS 151* 152*  BILITOT 1.4* 1.7*  PROT 7.0 7.1  ALBUMIN 3.3* 3.3*    No results for input(s): "LIPASE", "AMYLASE" in the last 168 hours. No results for input(s): "AMMONIA" in the last 168 hours.  CBC: Recent Labs  Lab 06/21/22 1826 06/22/22 0013 06/22/22 0458 06/23/22 0453 06/23/22 0619 06/23/22 0700 06/24/22 0010  WBC 9.7   < > 8.7 8.7 11.1* 10.4 15.1*  NEUTROABS 7.7  --  6.8 7.8* 10.0* 9.6*  --   HGB 11.9*   < > 12.4* 9.6* 11.5* 12.3* 11.4*  HCT 35.9*   < > 37.4* 31.0* 36.8* 38.7* 34.8*  MCV 99.4   < > 100.0 104.0* 102.8* 102.7* 99.1  PLT 205   < > 214 184 252 253 221   < > = values in this interval not displayed.     INR: Recent Labs  Lab 06/22/22 0013 06/22/22 0458 06/23/22 0619 06/23/22 0700 06/24/22 0010  INR 1.6* 1.5* 1.4* 1.4* 1.3*     Other results:   Imaging   No results found.   Medications:     Scheduled Medications:  amiodarone  100 mg Oral Daily   cephALEXin  500 mg Oral TID   clopidogrel  75 mg Oral Daily   erythromycin  Left Eye Q8H   pantoprazole  40 mg Oral Daily   potassium chloride  20 mEq Oral Daily   rosuvastatin  10 mg Oral Q M,W,F   Warfarin - Pharmacist Dosing Inpatient   Does not apply q1600    Infusions:  heparin 800 Units/hr (06/24/22 0400)    PRN Medications: acetaminophen **OR** acetaminophen (TYLENOL) oral liquid 160 mg/5 mL **OR** acetaminophen, acetaminophen, cyclobenzaprine, iohexol, ondansetron (ZOFRAN) IV, mouth rinse, traMADol, traZODone   Patient Profile    76 y/o male with h/o CAD with ankylosing spondylitis, DM2, carotid stenting, severe ischemic CM s/p anterior STEM with VT arrest in 12/15, large PE with pulmonary infarct in 2/15 s/p IVC filter and systolic HF with EF 16% and recent refractory epistaxis.  Admitted for planned nasal artery embolization and management of anticoagulation.  Assessment/Plan:     1. Epistaxis,  refractory - has failed management with nasal packing and ENT - s/p bilateral nasal artery embolization by IR 12/22 - No further bleeding. Hgb stable - On heparin at 800u/hr.  - INR 1.3 -> back on warfarin. Discussed dosing with PharmD personally. - If INR < 1.5 tomorrow will increase heparin to 1,000u/hr   2. Chronic systolic HF: Ischemic cardiomyopathy, EF 20% s/p HMII LVAD 09/20/13.   - Stable NYHA II - MAPs ok  - Volume still up. Lasix 20 IV x 1   3. Esophageal stricture - symptomatically improved with cutting food up - Barium swallow 7/23 suggestive of stricture at [place] + esophageal dysmotility - has seen GI. Discussed EGD but he wants to defer for now as symptoms improved - reminded him to be vigilant about cutting/chewing his food well  - No change   4. VT/VF c/b CHB - much improved with CRT-D with LBBB lead 5/23 - no further VT - Continue amio at 200 daily - keep K > 4.0. Mg > 2.0   5. CAD s/p Anterior STEMI on 06/12/14 with stenting of LAD:  - No s/s ischemia - ASA on hold with epistaxis. Resume later - Previously stopped statin but now back on crestor 3x/week. No change   6. DL infection, chronic - Site looks good  - Remains on suppressive Keflex - Continue weekly dressing changes   7. VAD management:  - Had admit in 08/2017 for elevated LDH.  - Off asa with recent epistaxis. Holding coumadin for planned procedure.  - Goal INR 2.0-2.5. INR 1.3 this am.  - LDH ok - On heparin at 800u/hr.  - Back on warfarin. Dosing d/w PharmD - Continue Keflex as above for DL - VAD interrogated personally. Parameters stable.   8. Ankylosing spondylitis: Off NSAIDS. -  No longer following with rheumatology. Uses tramadol. Avoids NSAIDs as much as possible. -  No change   9. HTN:  - Keep MAP 70-90.    10.  Kidney Stones  - CT with renal calculi bilaterally. Had some previous hematuria. None recently  Now followed by Dr Tresa Moore at [facility].  11. L eye  drainage - Continue erythromycin ointment - resolved   I reviewed the LVAD parameters from today, and compared the results to the patient's prior recorded data.  No programming changes were made.  The LVAD is functioning within specified parameters.  The patient performs LVAD self-test daily.  LVAD interrogation was negative for any significant power changes, alarms or PI events/speed drops.  LVAD equipment check completed and is in good working order.  Back-up equipment present.   LVAD education done on  emergency procedures and precautions and reviewed exit site care.  Length of Stay: 3  [name], MD 06/24/2022, 9:22 AM  VAD Team --- VAD ISSUES ONLY--- Pager [phone] (7am - 7am)  Advanced Heart Failure Team  Pager [phone] (M-F; 7a - 5p)  Please contact [facility] for night-coverage after hours (5p -7a ) and weekends on [email or web address]

## 2022-06-25 ENCOUNTER — Encounter (HOSPITAL_COMMUNITY): Payer: Self-pay | Admitting: Interventional Radiology

## 2022-06-25 DIAGNOSIS — R040 Epistaxis: Secondary | ICD-10-CM | POA: Diagnosis not present

## 2022-06-25 LAB — CBC
HCT: 36.6 % — ABNORMAL LOW (ref 39.0–52.0)
Hemoglobin: 11.5 g/dL — ABNORMAL LOW (ref 13.0–17.0)
MCH: 32.2 pg (ref 26.0–34.0)
MCHC: 31.4 g/dL (ref 30.0–36.0)
MCV: 102.5 fL — ABNORMAL HIGH (ref 80.0–100.0)
Platelets: 241 10*3/uL (ref 150–400)
RBC: 3.57 MIL/uL — ABNORMAL LOW (ref 4.22–5.81)
RDW: 15.6 % — ABNORMAL HIGH (ref 11.5–15.5)
WBC: 11.3 10*3/uL — ABNORMAL HIGH (ref 4.0–10.5)
nRBC: 0 % (ref 0.0–0.2)

## 2022-06-25 LAB — BASIC METABOLIC PANEL
Anion gap: 6 (ref 5–15)
BUN: 24 mg/dL — ABNORMAL HIGH (ref 8–23)
CO2: 30 mmol/L (ref 22–32)
Calcium: 9.2 mg/dL (ref 8.9–10.3)
Chloride: 103 mmol/L (ref 98–111)
Creatinine, Ser: 1.34 mg/dL — ABNORMAL HIGH (ref 0.61–1.24)
GFR, Estimated: 56 mL/min — ABNORMAL LOW (ref >60)
Glucose, Bld: 111 mg/dL — ABNORMAL HIGH (ref 70–99)
Potassium: 5.4 mmol/L — ABNORMAL HIGH (ref 3.5–5.1)
Sodium: 139 mmol/L (ref 135–145)

## 2022-06-25 LAB — HEPARIN LEVEL (UNFRACTIONATED): Heparin Unfractionated: <0.1 IU/mL — ABNORMAL LOW (ref 0.30–0.70)

## 2022-06-25 LAB — LACTATE DEHYDROGENASE: LDH: 211 U/L — ABNORMAL HIGH (ref 98–192)

## 2022-06-25 LAB — PROTIME-INR
INR: 1.3 — ABNORMAL HIGH (ref 0.8–1.2)
Prothrombin Time: 16.3 seconds — ABNORMAL HIGH (ref 11.4–15.2)

## 2022-06-25 MED ORDER — FUROSEMIDE 10 MG/ML IJ SOLN
40.0000 mg | Freq: Once | INTRAMUSCULAR | Status: AC
  Administered 2022-06-25: 40 mg via INTRAVENOUS
  Filled 2022-06-25: qty 4

## 2022-06-25 MED ORDER — WARFARIN SODIUM 7.5 MG PO TABS
7.5000 mg | ORAL_TABLET | Freq: Once | ORAL | Status: AC
  Administered 2022-06-25: 7.5 mg via ORAL
  Filled 2022-06-25: qty 1

## 2022-06-25 NOTE — Progress Notes (Signed)
Reported by NT , he prefers to have TEDS applied later on.

## 2022-06-25 NOTE — Progress Notes (Signed)
Advanced Heart Failure VAD Team Note  PCP-Cardiologist: None   Subjective:    Underwent bilateral nasal artery embolization (12/22). Nasal packing removed  Back on heparin @ 800/hr. No further bleeding. HL remains undetectable  Diuresed with IV lasix yesterday. Weight up 2 pounds. No CP or SOB  LDH 211  INR 1.3   LVAD INTERROGATION:  HeartMate II LVAD:   Flow 4.9 liters/min, speed 9200, power 5.0, PI 4.0   Objective:    Vital Signs:   Temp:  [97.3 F (36.3 C)-98.1 F (36.7 C)] 97.3 F (36.3 C) (12/25 0800) Pulse Rate:  [78-80] 79 (12/25 0800) Resp:  [16-23] 23 (12/25 0800) BP: (96-100)/(65-81) 100/65 (12/24 1952) SpO2:  [94 %-97 %] 97 % (12/25 0800) Weight:  [84.8 kg] 84.8 kg (12/25 0449) Last BM Date : 06/23/22 Mean arterial Pressure 80-90s  Intake/Output:   Intake/Output Summary (Last 24 hours) at 06/25/2022 0838 Last data filed at 06/25/2022 0600 Gross per 24 hour  Intake 327.06 ml  Output 575 ml  Net -247.94 ml      Physical Exam    General:  NAD.  HEENT: normal  Neck: supple. JVP not elevated.  Carotids 2+ bilat; no bruits. No lymphadenopathy or thryomegaly appreciated. Cor: LVAD hum.  Lungs: Clear. Abdomen:  soft, nontender, non-distended. No hepatosplenomegaly. No bruits or masses. Good bowel sounds. Driveline site clean. Anchor in place.  Extremities: no cyanosis, clubbing, rash. Warm 2+ edema  Neuro: alert & oriented x 3. No focal deficits. Moves all 4 without problem      Telemetry   AV paced, 80s  Personally reviewed  Labs   Basic Metabolic Panel: Recent Labs  Lab 06/22/22 0458 06/23/22 0619 06/23/22 0700 06/24/22 0010 06/25/22 0010  NA 140 136 135 137 139  K 4.1 4.6 4.8 4.4 5.4*  CL 105 104 102 103 103  CO2 '26 23 23 25 30  '$ GLUCOSE 95 139* 164* 115* 111*  BUN '13 15 17 22 '$ 24*  CREATININE 1.06 1.00 1.16 1.09 1.34*  CALCIUM 9.1 8.8* 8.9 8.9 9.2     Liver Function Tests: Recent Labs  Lab 06/21/22 1826 06/22/22 0458   AST 28 28  ALT 17 16  ALKPHOS 151* 152*  BILITOT 1.4* 1.7*  PROT 7.0 7.1  ALBUMIN 3.3* 3.3*    No results for input(s): "LIPASE", "AMYLASE" in the last 168 hours. No results for input(s): "AMMONIA" in the last 168 hours.  CBC: Recent Labs  Lab 06/21/22 1826 06/22/22 0013 06/22/22 0458 06/23/22 0453 06/23/22 0619 06/23/22 0700 06/24/22 0010 06/25/22 0010  WBC 9.7   < > 8.7 8.7 11.1* 10.4 15.1* 11.3*  NEUTROABS 7.7  --  6.8 7.8* 10.0* 9.6*  --   --   HGB 11.9*   < > 12.4* 9.6* 11.5* 12.3* 11.4* 11.5*  HCT 35.9*   < > 37.4* 31.0* 36.8* 38.7* 34.8* 36.6*  MCV 99.4   < > 100.0 104.0* 102.8* 102.7* 99.1 102.5*  PLT 205   < > 214 184 252 253 221 241   < > = values in this interval not displayed.     INR: Recent Labs  Lab 06/22/22 0458 06/23/22 0619 06/23/22 0700 06/24/22 0010 06/25/22 0010  INR 1.5* 1.4* 1.4* 1.3* 1.3*     Other results:   Imaging   No results found.   Medications:     Scheduled Medications:  amiodarone  100 mg Oral Daily   cephALEXin  500 mg Oral TID   clopidogrel  75 mg  Oral Daily   erythromycin   Left Eye Q8H   pantoprazole  40 mg Oral Daily   rosuvastatin  10 mg Oral Q M,W,F   Warfarin - Pharmacist Dosing Inpatient   Does not apply q1600    Infusions:  heparin 800 Units/hr (06/25/22 0800)    PRN Medications: acetaminophen **OR** acetaminophen (TYLENOL) oral liquid 160 mg/5 mL **OR** acetaminophen, acetaminophen, cyclobenzaprine, iohexol, ondansetron (ZOFRAN) IV, mouth rinse, traMADol, traZODone   Patient Profile    76 y/o male with h/o CAD with ankylosing spondylitis, DM2, carotid stenting, severe ischemic CM s/p anterior STEM with VT arrest in 12/15, large PE with pulmonary infarct in 2/15 s/p IVC filter and systolic HF with EF 78% and recent refractory epistaxis.  Admitted for planned nasal artery embolization and management of anticoagulation.  Assessment/Plan:     1. Epistaxis, refractory - has failed management  with nasal packing and ENT - s/p bilateral nasal artery embolization by IR 12/22 - No further bleeding. Hgb stable - On heparin at 800u/hr.  - INR 1.3 -> back on warfarin. Discussed dosing with PharmD personally. - With HM-II, will increase heparin to 1,000u/hr   2. Chronic systolic HF: Ischemic cardiomyopathy, EF 20% s/p HMII LVAD 09/20/13.  - Stable NYHA II - MAPs ok  - Volume up -> lasix 40 IV. Place TED hose  3. Esophageal stricture - symptomatically improved with cutting food up - Barium swallow 7/23 suggestive of stricture at [place] + esophageal dysmotility - has seen GI. Discussed EGD but he wants to defer for now as symptoms improved - reminded him to be vigilant about cutting/chewing his food well  - No change   4. VT/VF c/b CHB - much improved with CRT-D with LBBB lead 5/23 - no further VT - Continue amio at 200 daily - keep K > 4.0. Mg > 2.0   5. CAD s/p Anterior STEMI on 06/12/14 with stenting of LAD:  - No s/s ischemia - ASA on hold with epistaxis. Resume later - Previously stopped statin but now back on crestor 3x/week. No change   6. DL infection, chronic - Site looks good  - Remains on suppressive Keflex - Continue weekly dressing changes   7. VAD management:  - Had admit in 08/2017 for elevated LDH.  - Off asa with recent epistaxis. Holding coumadin for planned procedure.  - Goal INR 2.0-2.5. INR 1.3 this am.  - LDH ok - On heparin at 800u/hr. -> with HM II increase to 1,000  - Back on warfarin. Dosing d/w PharmD - Continue Keflex as above for DL - VAD interrogated personally. Parameters stable.   8. Ankylosing spondylitis: Off NSAIDS. -  No longer following with rheumatology. Uses tramadol. Avoids NSAIDs as much as possible. -  No change   9. HTN:  - Keep MAP 70-90.    10.  Kidney Stones  - CT with renal calculi bilaterally. Had some previous hematuria. None recently  Now followed by Dr Tresa Moore at [facility].  11. L eye drainage -  Continue erythromycin ointment - resolved   I reviewed the LVAD parameters from today, and compared the results to the patient's prior recorded data.  No programming changes were made.  The LVAD is functioning within specified parameters.  The patient performs LVAD self-test daily.  LVAD interrogation was negative for any significant power changes, alarms or PI events/speed drops.  LVAD equipment check completed and is in good working order.  Back-up equipment present.   LVAD education done  on emergency procedures and precautions and reviewed exit site care.  Length of Stay: [place], MD 06/25/2022, 8:38 AM  VAD Team --- VAD ISSUES ONLY--- Pager [phone] (7am - 7am)  Advanced Heart Failure Team  Pager [phone] (M-F; 7a - 5p)  Please contact [facility] for night-coverage after hours (5p -7a ) and weekends on [email or web address]

## 2022-06-25 NOTE — Progress Notes (Signed)
ANTICOAGULATION CONSULT NOTE - Follow-up Consult  Pharmacy Consult for heparin, warfarin Indication:  LVAD, history of PE  Allergies  Allergen Reactions   Biopatch [Chlorhexidine] Dermatitis and Rash   Desyrel [Trazodone] Other (See Comments)    insomnia   Other Dermatitis    Adhesive    Patient Measurements: Height: '5\' 10"'$  (177.8 cm) Weight: 84.8 kg (186 lb 15.2 oz) IBW/kg (Calculated) : 73 Heparin Dosing Weight: 84kg  Vital Signs: Temp: 97.9 F (36.6 C) (12/25 0600) Temp Source: Oral (12/25 0600) BP: 100/65 (12/24 1952) Pulse Rate: 79 (12/25 0600)  Labs: Recent Labs    06/23/22 0700 06/24/22 0010 06/25/22 0010  HGB 12.3* 11.4* 11.5*  HCT 38.7* 34.8* 36.6*  PLT 253 221 241  LABPROT 16.9* 16.3* 16.3*  INR 1.4* 1.3* 1.3*  HEPARINUNFRC <0.10* <0.10* <0.10*  CREATININE 1.16 1.09 1.34*     Estimated Creatinine Clearance: 49.9 mL/min (A) (by C-G formula based on SCr of 1.34 mg/dL (H)).  Assessment: 76 year old male with HM2 LVAD presents to [facility] as a direct admit for heparin bridge now s/p nasal artery embolization. Plavix resumed and nasal packing removed. No bleeding noted this AM.  -INR 1.3 after warfarin 6 mg x 2 days -Heparin level undetectable this morning as expected  Home warfarin dose: '3mg'$ /day except take '6mg'$  on MF  MD requested aggressive warfarin dosing. Will give another boosted dose today. Will also slightly increase heparin rate per MD. Suspect will have to decrease warfarin dose significantly tomorrow.  Goal of Therapy:  INR goal 2-2.5 Heparin level < 0.3 Monitor platelets by anticoagulation protocol: Yes   Plan:  -Increase heparin at 1000 units/hr per MD -Daily heparin level and CBC -Warfarin 7.5 mg today per MD -Daily PT/INR  Thank you for allowing pharmacy to participate in this patient's care.  Reatha Harps, PharmD PGY2 Pharmacy Resident 06/25/2022 6:58 AM Check [email or web address] for unit specific pharmacy number

## 2022-06-26 DIAGNOSIS — R040 Epistaxis: Secondary | ICD-10-CM | POA: Diagnosis not present

## 2022-06-26 LAB — BASIC METABOLIC PANEL
Anion gap: 6 (ref 5–15)
BUN: 25 mg/dL — ABNORMAL HIGH (ref 8–23)
CO2: 29 mmol/L (ref 22–32)
Calcium: 9 mg/dL (ref 8.9–10.3)
Chloride: 100 mmol/L (ref 98–111)
Creatinine, Ser: 1.23 mg/dL (ref 0.61–1.24)
GFR, Estimated: >60 mL/min (ref >60)
Glucose, Bld: 126 mg/dL — ABNORMAL HIGH (ref 70–99)
Potassium: 4.6 mmol/L (ref 3.5–5.1)
Sodium: 135 mmol/L (ref 135–145)

## 2022-06-26 LAB — LACTATE DEHYDROGENASE: LDH: 235 U/L — ABNORMAL HIGH (ref 98–192)

## 2022-06-26 LAB — HEPARIN LEVEL (UNFRACTIONATED): Heparin Unfractionated: 0.1 IU/mL — ABNORMAL LOW (ref 0.30–0.70)

## 2022-06-26 LAB — CBC
HCT: 36.8 % — ABNORMAL LOW (ref 39.0–52.0)
Hemoglobin: 11.9 g/dL — ABNORMAL LOW (ref 13.0–17.0)
MCH: 32.5 pg (ref 26.0–34.0)
MCHC: 32.3 g/dL (ref 30.0–36.0)
MCV: 100.5 fL — ABNORMAL HIGH (ref 80.0–100.0)
Platelets: 234 10*3/uL (ref 150–400)
RBC: 3.66 MIL/uL — ABNORMAL LOW (ref 4.22–5.81)
RDW: 15.9 % — ABNORMAL HIGH (ref 11.5–15.5)
WBC: 10.7 10*3/uL — ABNORMAL HIGH (ref 4.0–10.5)
nRBC: 0.2 % (ref 0.0–0.2)

## 2022-06-26 LAB — PROTIME-INR
INR: 1.5 — ABNORMAL HIGH (ref 0.8–1.2)
Prothrombin Time: 18.2 seconds — ABNORMAL HIGH (ref 11.4–15.2)

## 2022-06-26 MED ORDER — WARFARIN SODIUM 7.5 MG PO TABS
7.5000 mg | ORAL_TABLET | Freq: Once | ORAL | Status: AC
  Administered 2022-06-26: 7.5 mg via ORAL
  Filled 2022-06-26: qty 1

## 2022-06-26 MED ORDER — FUROSEMIDE 10 MG/ML IJ SOLN
40.0000 mg | Freq: Once | INTRAMUSCULAR | Status: AC
  Administered 2022-06-26: 40 mg via INTRAVENOUS
  Filled 2022-06-26: qty 4

## 2022-06-26 NOTE — Progress Notes (Signed)
LVAD Coordinator Rounding Note:  Admitted 06/21/22 to Dr. Haroldine Laws due to need for nasal artery embolization in IR.   HM II LVAD implanted on 09/21/14 by Dr. Darcey Nora under Destination Therapy criteria.  Pt sitting in chair on my arrival. Denies complaints at this time. States he is doing well post surgery and has had no recurrent bleeding.  Vital signs: Temp: 97.8 HR: AV paced 80 Doppler Pressure: 78 Automatic BP: 110/99 (104) O2 Sat: 99% on RA Wt: 184.3>182.1>189.7>184.4>186.9>185.4 lbs    LVAD interrogation reveals:  Speed: 9200 Flow: 4.7 Power: 5.3 w PI: 5.0  Alarms: none Events: none  Fixed speed: 9200 Low speed limit: 8600   Drive Line:  Dressing clean, dry, and intact. Anchor correctly applied. Wife says she wants to do dressing changes at this time. Dressing change per bedside RN or patient's wife [name]. Dressing change due 06/28/22 .   Labs:  LDH trend: 246>220>227>219>211>235  INR trend: 1.6>1.5>1.4>1.4>1.3>1.5  Anticoagulation Plan: -INR Goal:  2.0 - 3.0 -ASA Dose: 81 mg daily--on hold with nosebleeds -Plavix: restarted 06/22/22 -Heparin 1000 u/hr  Blood Products:    Device: N/A  Arrythmias:  - 5/12//23 admitted with VT>>emergent DC-CV  - 11/10/21 complete heart block/bradycardia >>started on Dopamine gtt - 5/15 Recurrent VT -> DC-CV - 5/16 Recurrent VT-->DC-CV/ SP Dual Chamber MDT ICD   Drips: Heparin 1000 units/hr   Plan/Recommendations:  Call VAD pager if any issues with VAD equipment or drive line site. Weekly dressing changes per pt's wife or bedside RN.   Bobbye Morton RN,BSN [place] Coordinator  Office: [phone]  24/7 Pager: [phone]

## 2022-06-26 NOTE — Progress Notes (Signed)
ANTICOAGULATION CONSULT NOTE - Follow-up Consult  Pharmacy Consult for heparin, warfarin Indication:  LVAD, history of PE  Allergies  Allergen Reactions   Biopatch [Chlorhexidine] Dermatitis and Rash   Desyrel [Trazodone] Other (See Comments)    insomnia   Other Dermatitis    Adhesive    Patient Measurements: Height: '5\' 10"'$  (177.8 cm) Weight: 84.1 kg (185 lb 6.5 oz) IBW/kg (Calculated) : 73 Heparin Dosing Weight: 84kg  Vital Signs: Temp: 97.8 F (36.6 C) (12/26 0751) Temp Source: Oral (12/26 0751) BP: 110/99 (12/26 0751) Pulse Rate: 79 (12/26 0751)  Labs: Recent Labs    06/24/22 0010 06/25/22 0010 06/26/22 0017  HGB 11.4* 11.5* 11.9*  HCT 34.8* 36.6* 36.8*  PLT 221 241 234  LABPROT 16.3* 16.3* 18.2*  INR 1.3* 1.3* 1.5*  HEPARINUNFRC <0.10* <0.10* 0.10*  CREATININE 1.09 1.34* 1.23     Estimated Creatinine Clearance: 54.4 mL/min (by C-G formula based on SCr of 1.23 mg/dL).  Assessment: 76 year old male with HM2 LVAD presents to [facility] as a direct admit for heparin bridge now s/p nasal artery embolization. Plavix resumed and nasal packing removed. No bleeding noted this AM.  -INR 1.5 trending up but not quite to goal after  restarting warfarin  -Heparin level 0.1 on heparin drip 1000 uts/hr  No bleeding noted cbc stable   Home warfarin dose: '3mg'$ /day except take '6mg'$  on MF   Goal of Therapy:  INR goal 2-2.5 Heparin level < 0.3 Monitor platelets by anticoagulation protocol: Yes   Plan:  Continue heparin at 1000 units/hr no up titrations -Daily heparin level and CBC -Warfarin 7.5 mg x1 today -Daily PT/INR   Bonnita Nasuti Pharm.D. CPP, BCPS Clinical Pharmacist [phone] 06/26/2022 1:59 PM

## 2022-06-26 NOTE — Progress Notes (Signed)
Mobility Specialist Progress Note    06/26/22 0959  Mobility  Activity Ambulated independently in hallway  Level of Assistance Independent  Assistive Device None  Distance Ambulated (ft) 420 ft  Activity Response Tolerated well  $Mobility charge 1 Mobility   Post-Mobility: 80 HR  Pt received in chair and agreeable. No complaints on walk. Returned to chair with call bell in reach.    Hildred Alamin Mobility Specialist  Please Psychologist, sport and exercise or Rehab Office at [phone]

## 2022-06-26 NOTE — Progress Notes (Addendum)
Advanced Heart Failure VAD Team Note  PCP-Cardiologist: None   Subjective:    Underwent bilateral nasal artery embolization (12/22). Nasal packing removed  Remains on heparin '@1000'$ /hr. No further bleeding. Heparin level 0.10 today  Diuresed with IV lasix yesterday. Weight down 1 pound. No CP or SOB, ambulating the halls in morning and afternoon.   LDH 235  INR 1.5   LVAD INTERROGATION:  HeartMate II LVAD:   Flow 5 liters/min, speed 9200, power 5.7, PI 4.3, x2 PI events   Objective:    Vital Signs:   Temp:  [97.1 F (36.2 C)-98.1 F (36.7 C)] 97.5 F (36.4 C) (12/26 0438) Pulse Rate:  [79-81] 80 (12/26 0438) Resp:  [2-23] 18 (12/26 0438) BP: (75)/(62) 75/62 (12/25 2237) SpO2:  [96 %-98 %] 97 % (12/26 0438) Weight:  [84.1 kg] 84.1 kg (12/26 0438) Last BM Date : 06/24/22 Mean arterial Pressure 70-80s  Intake/Output:   Intake/Output Summary (Last 24 hours) at 06/26/2022 0717 Last data filed at 06/26/2022 0438 Gross per 24 hour  Intake 526.33 ml  Output 2420 ml  Net -1893.67 ml     Physical Exam    General:  Well appearing. No resp difficulty HEENT: Normal Neck: supple. JVP ~10. Carotids 2+ bilat; no bruits. No lymphadenopathy or thyromegaly appreciated. Cor: Mechanical heart sounds with LVAD hum present. Lungs: Clear Abdomen: soft, nontender, nondistended. No hepatosplenomegaly. No bruits or masses. Good bowel sounds. Driveline: C/D/I; securement device intact and driveline incorporated. Mild erythema around site Extremities: no cyanosis, clubbing, rash, +1 BLE edema Neuro: alert & orientedx3, cranial nerves grossly intact. moves all 4 extremities w/o difficulty. Affect pleasant   Telemetry   AV paced, 80s (Personally reviewed)    Labs   Basic Metabolic Panel: Recent Labs  Lab 06/23/22 0619 06/23/22 0700 06/24/22 0010 06/25/22 0010 06/26/22 0017  NA 136 135 137 139 135  K 4.6 4.8 4.4 5.4* 4.6  CL 104 102 103 103 100  CO2 '23 23 25 30 29   '$ GLUCOSE 139* 164* 115* 111* 126*  BUN '15 17 22 '$ 24* 25*  CREATININE 1.00 1.16 1.09 1.34* 1.23  CALCIUM 8.8* 8.9 8.9 9.2 9.0    Liver Function Tests: Recent Labs  Lab 06/21/22 1826 06/22/22 0458  AST 28 28  ALT 17 16  ALKPHOS 151* 152*  BILITOT 1.4* 1.7*  PROT 7.0 7.1  ALBUMIN 3.3* 3.3*   No results for input(s): "LIPASE", "AMYLASE" in the last 168 hours. No results for input(s): "AMMONIA" in the last 168 hours.  CBC: Recent Labs  Lab 06/21/22 1826 06/22/22 0013 06/22/22 0458 06/23/22 0453 06/23/22 0619 06/23/22 0700 06/24/22 0010 06/25/22 0010 06/26/22 0017  WBC 9.7   < > 8.7 8.7 11.1* 10.4 15.1* 11.3* 10.7*  NEUTROABS 7.7  --  6.8 7.8* 10.0* 9.6*  --   --   --   HGB 11.9*   < > 12.4* 9.6* 11.5* 12.3* 11.4* 11.5* 11.9*  HCT 35.9*   < > 37.4* 31.0* 36.8* 38.7* 34.8* 36.6* 36.8*  MCV 99.4   < > 100.0 104.0* 102.8* 102.7* 99.1 102.5* 100.5*  PLT 205   < > 214 184 252 253 221 241 234   < > = values in this interval not displayed.    INR: Recent Labs  Lab 06/23/22 0619 06/23/22 0700 06/24/22 0010 06/25/22 0010 06/26/22 0017  INR 1.4* 1.4* 1.3* 1.3* 1.5*    Other results:   Imaging   No results found.   Medications:  Scheduled Medications:  amiodarone  100 mg Oral Daily   cephALEXin  500 mg Oral TID   clopidogrel  75 mg Oral Daily   erythromycin   Left Eye Q8H   pantoprazole  40 mg Oral Daily   rosuvastatin  10 mg Oral Q M,W,F   Warfarin - Pharmacist Dosing Inpatient   Does not apply q1600    Infusions:  heparin 1,000 Units/hr (06/25/22 1900)    PRN Medications: acetaminophen **OR** acetaminophen (TYLENOL) oral liquid 160 mg/5 mL **OR** acetaminophen, acetaminophen, cyclobenzaprine, iohexol, ondansetron (ZOFRAN) IV, mouth rinse, traMADol, traZODone   Patient Profile    76 y/o male with h/o CAD with ankylosing spondylitis, DM2, carotid stenting, severe ischemic CM s/p anterior STEM with VT arrest in 12/15, large PE with pulmonary  infarct in 2/15 s/p IVC filter and systolic HF with EF 70% and recent refractory epistaxis.  Admitted for planned nasal artery embolization and management of anticoagulation.  Assessment/Plan:     1. Epistaxis, refractory - has failed management with nasal packing and ENT - s/p bilateral nasal artery embolization by IR 12/22 - No further bleeding. Hgb stable - On heparin at 800u/hr.  - INR 1.5 -> on warfarin. Discussed dosing with PharmD personally. - With HM-II, continue heparin 1,000u/hr   2. Chronic systolic HF: Ischemic cardiomyopathy, EF 20% s/p HMII LVAD 09/20/13.  - Stable NYHA II - MAPs ok  - Volume up -> lasix 40 IV again today. Place TED hose  3. Esophageal stricture - symptomatically improved with cutting food up - Barium swallow 7/23 suggestive of stricture at [place] + esophageal dysmotility - has seen GI. Discussed EGD but he wants to defer for now as symptoms improved - reminded him to be vigilant about cutting/chewing his food well  - No change   4. VT/VF c/b CHB - much improved with CRT-D with LBBB lead 5/23 - no further VT - Continue amio at 200 daily - keep K > 4.0. Mg > 2.0   5. CAD s/p Anterior STEMI on 06/12/14 with stenting of LAD:  - No s/s ischemia - ASA on hold with epistaxis. Resume later - Previously stopped statin but now back on crestor 3x/week. No change   6. DL infection, chronic - Site looks good  - Remains on suppressive Keflex - Continue weekly dressing changes   7. VAD management:  - Had admit in 08/2017 for elevated LDH.  - Off asa with recent epistaxis. Holding coumadin for planned procedure.  - Goal INR 2.0-2.5. INR 1.5 this am.  - LDH ok - With HM II continue heparin at 1,000  - Back on warfarin. Dosing d/w PharmD - Continue Keflex as above for DL - VAD interrogated personally. Parameters stable.   8. Ankylosing spondylitis: Off NSAIDS. -  No longer following with rheumatology. Uses tramadol. Avoids NSAIDs as much as  possible. -  No change   9. HTN:  - Keep MAP 70-90.    10.  Kidney Stones  - CT with renal calculi bilaterally. Had some previous hematuria. None recently  Now followed by Dr Tresa Moore at [facility].  11. L eye drainage - Continue erythromycin ointment - resolved   I reviewed the LVAD parameters from today, and compared the results to the patient's prior recorded data.  No programming changes were made.  The LVAD is functioning within specified parameters.  The patient performs LVAD self-test daily.  LVAD interrogation was negative for any significant power changes, alarms or PI events/speed drops.  LVAD  equipment check completed and is in good working order.  Back-up equipment present.   LVAD education done on emergency procedures and precautions and reviewed exit site care.  Length of Stay: [place], NP 06/26/2022, 7:17 AM  VAD Team --- VAD ISSUES ONLY--- Pager [phone] (7am - 7am)  Advanced Heart Failure Team  Pager [phone] (M-F; 7a - 5p)  Please contact [facility] for night-coverage after hours (5p -7a ) and weekends on [email or web address]  Patient seen and examined with the above-signed Advanced Practice Provider and/or Housestaff. I personally reviewed laboratory data, imaging studies and relevant notes. I independently examined the patient and formulated the important aspects of the plan. I have edited the note to reflect any of my changes or salient points. I have personally discussed the plan with the patient and/or family.  Remains on IV heparin at 1,000u/hr. On warfarin. INR 1.5 no bleeding.   Got IV lasix yesterday with good diuresis. Weight down 1 pound.   No CP or SOB.   General:  NAD.  HEENT: normal  Neck: supple. JVP not elevated.  Carotids 2+ bilat; no bruits. No lymphadenopathy or thryomegaly appreciated. Cor: LVAD hum.  Lungs: Clear. Abdomen: soft, nontender, non-distended. No hepatosplenomegaly. No bruits or masses. Good bowel sounds. Driveline site  clean. Anchor in place.  Extremities: no cyanosis, clubbing, rash. Warm 1+ edema  Neuro: alert & oriented x 3. No focal deficits. Moves all 4 without problem   Remains on heparin/warfarin. No bleeding. INR 1.5. Diuresed modestly with IV lasix. Will repeat today.   VAD interrogated personally. Parameters stable.  [name], MD  8:29 AM

## 2022-06-26 NOTE — Discharge Summary (Incomplete)
Advanced Heart Failure Team  Discharge Summary   Patient ID: Scott [name] MRN: [id number], DOB/AGE: Sep 07, 1945 76 y.o. Admit date: 06/21/2022 D/C date:     06/27/2022   Primary Discharge Diagnoses:  Epistaxis, refractory  Secondary Discharge Diagnoses:  Chronic systolic HF: iCM, s/p HMII LVAD Esophageal stricture VT/VF c/b CHB CAD s/p anterior STEMI  DL infection, chronic HMII LVAD Ankylosis spondylitis: off NSAIDS HTN Kidney Stones L eye drainage   Hospital Course:  Scott [name] is a 76 y/o male with h/o CAD with ankylosing spondylitis, DM2, carotid stenting, severe ischemic CM s/p anterior STEM with VT arrest in 12/15, large PE with pulmonary infarct in 2/15 s/p IVC filter and systolic HF with EF 10% and recent refractory epistaxis.  He was seen in the VAD clinic and had been struggling with severe epistaxis. Has had nose packed and seen ENT OP without resolution. He was admitted for planned nasal artery embolization and management of anticoagulation.   Underwent bilateral nasal artery embolization 12/22. Remained admitted d/t low INR and slight volume overload. Started on hep gtt. Volume now stable, hep gtt off.   INR 1.7 (and climbing) at discharge and LDH stable. INR check scheduled for 07/04/22.   Pt will continue to be followed closely in the VAD/HF clinic, f/u scheduled and on AVS. Dr Scott [name] evaluated and deemed appropriate for discharge.    See below for detailed problem list:  1. Epistaxis, refractory - had failed management with nasal packing and ENT - s/p bilateral nasal artery embolization by IR 12/22 - No further bleeding. Hgb stable - INR 1.7 -> on warfarin. Discussed dosing with PharmD personally. 2. Chronic systolic HF: Ischemic cardiomyopathy, EF 20% s/p HMII LVAD 09/20/13.  - Stable NYHA II - MAPs ok  - Volume stable, s/p 40 IV lasix yesterday. Weight down to admission weight. Will restart home regimen, alternating 40/20 every other day PO  lasix 3. Esophageal stricture - symptomatically improved with cutting food up - Barium swallow 7/23 suggestive of stricture at [place] + esophageal dysmotility - has seen GI. Discussed EGD but he wants to defer for now as symptoms improved - reminded him to be vigilant about cutting/chewing his food well  4. VT/VF c/b CHB - much improved with CRT-D with LBBB lead 5/23 - no further VT - Continue amio at 100 daily - keep K > 4.0. Mg > 2.0 5. CAD s/p Anterior STEMI on 06/12/14 with stenting of LAD:  - No s/s ischemia - ASA on hold with epistaxis. Resume later - Previously stopped statin but now back on crestor 3x/week. No change 6. DL infection, chronic - Site looks good  - Remains on suppressive Keflex - Continue weekly dressing changes 7. VAD management:  - Had admit in 08/2017 for elevated LDH.  - Off asa with recent epistaxis.  - Goal INR 2.0-2.5. INR 1.7 this am.  - LDH ok - With HM II heparin at 1,000, d/c'd at discharge - Back on warfarin. Dosing d/w PharmD - Decreasing Keflex to BID, was TID w/ nasal packing - VAD interrogated personally. Parameters stable. 8. Ankylosing spondylitis: Off NSAIDS. -  No longer following with rheumatology. Uses tramadol. Avoids NSAIDs as much as possible. -  No change 9. HTN:  - Keep MAP 70-90.   10.  Kidney Stones  - CT with renal calculi bilaterally. Had some previous hematuria. None recently  Now followed by Dr Scott [name] at [facility]. 11. L eye drainage - Used erythromycin ointment - resolved  LVAD  Interrogation HM II:  Speed: 9200 Flow: 4.8 PI: 4.4   Power: 5.5  Back-up speed:  8600     Discharge Weight Range: 83.7 kg Discharge Vitals: Blood pressure 92/72, pulse 70, temperature 97.7 F (36.5 C), temperature source Oral, resp. rate 17, height '5\' 10"'$  (1.778 m), weight 83.7 kg, SpO2 92 %.  Labs: Lab Results  Component Value Date   WBC 10.7 (H) 06/26/2022   HGB 11.9 (L) 06/26/2022   HCT 36.8 (L) 06/26/2022   MCV 100.5 (H)  06/26/2022   PLT 234 06/26/2022    Recent Labs  Lab 06/22/22 0458 06/23/22 0619 06/27/22 0829  NA 140   < > 137  K 4.1   < > 4.1  CL 105   < > 104  CO2 26   < > 26  BUN 13   < > 18  CREATININE 1.06   < > 0.92  CALCIUM 9.1   < > 8.8*  PROT 7.1  --   --   BILITOT 1.7*  --   --   ALKPHOS 152*  --   --   ALT 16  --   --   AST 28  --   --   GLUCOSE 95   < > 117*   < > = values in this interval not displayed.   Lab Results  Component Value Date   CHOL 210 (H) 03/15/2021   HDL 41 03/15/2021   LDLCALC 160 (H) 03/15/2021   TRIG 43 03/15/2021   BNP (last 3 results) No results for input(s): "BNP" in the last 8760 hours.  ProBNP (last 3 results) No results for input(s): "PROBNP" in the last 8760 hours.   Diagnostic Studies/Procedures   No results found.  Discharge Medications   Allergies as of 06/27/2022       Reactions   Biopatch [chlorhexidine] Dermatitis, Rash   Desyrel [trazodone] Other (See Comments)   insomnia   Other Dermatitis   Adhesive        Medication List     TAKE these medications    acetaminophen 500 MG tablet Commonly known as: TYLENOL Take 1,000 mg by mouth every 6 (six) hours as needed for moderate pain.   amiodarone 200 MG tablet Commonly known as: PACERONE Take 0.5 tablets (100 mg total) by mouth daily.   cephALEXin 500 MG capsule Commonly known as: Keflex Take 1 capsule (500 mg total) by mouth 2 (two) times daily. What changed: when to take this   clobetasol ointment 0.05 % Commonly known as: TEMOVATE Apply 1 application topically 2 (two) times daily. What changed:  when to take this reasons to take this   clopidogrel 75 MG tablet Commonly known as: PLAVIX Take 1 tablet (75 mg total) by mouth daily.   cyclobenzaprine 5 MG tablet Commonly known as: FLEXERIL Take 1 tablet (5 mg total) by mouth 3 (three) times daily as needed for muscle spasms.   furosemide 20 MG tablet Commonly known as: LASIX Take 1 tablet (20 mg total)  by mouth daily. Take 1 tablet (20 mg) by mouth alternating with taking 2 tablets (40 mg) by mouth every other day cyclically.   pantoprazole 40 MG tablet Commonly known as: PROTONIX TAKE 1 TABLET (40 MG TOTAL) BY MOUTH TWICE A DAY BEFORE MEALS What changed: See the new instructions.   potassium chloride SA 20 MEQ tablet Commonly known as: KLOR-CON M Take 1 tablet (20 mEq total) by mouth daily. Take 1 tablet (20 meq) by mouth alternating with  taking 2 tablets (40 meq) by mouth every other day cyclically.  1 tablet with 20 mg lasix 2 tablets with 40 mg lasix What changed:  how much to take additional instructions   rosuvastatin 10 MG tablet Commonly known as: CRESTOR Take 1 tablet (10 mg total) by mouth every Monday, Wednesday, and Friday.   traMADol 50 MG tablet Commonly known as: ULTRAM Take 2 tablets (100 mg total) by mouth every 6 (six) hours as needed for moderate pain. What changed:  how much to take when to take this   traZODone 50 MG tablet Commonly known as: DESYREL Take 50 mg by mouth at bedtime as needed for sleep.   warfarin 6 MG tablet Commonly known as: COUMADIN Take as directed. If you are unsure how to take this medication, talk to your nurse or doctor. Original instructions: TAKE 1 TABLET BY MOUTH EVERY DAY What changed:  how much to take how to take this when to take this additional instructions        Disposition   The patient will be discharged in stable condition to home. Discharge Instructions     (HEART FAILURE PATIENTS) Call MD:  Anytime you have any of the following symptoms: 1) 3 pound weight gain in 24 hours or 5 pounds in 1 week 2) shortness of breath, with or without a dry hacking cough 3) swelling in the hands, feet or stomach 4) if you have to sleep on extra pillows at night in order to breathe.   Complete by: As directed    Diet - low sodium heart healthy   Complete by: As directed    Increase activity slowly   Complete by: As  directed        Follow-up Information     [facility] [place] Follow up on 07/04/2022.   Specialty: Cardiology Why: LAB draw appointment  07/04/22 at 930am Contact information: [address] [id number] [place] Follow up on 07/23/2022.   Specialty: Cardiology Why: Follow up in LVAD clinic with Dr. Haroldine [name] 07/23/22 at [facility], free valet Please bring all medications with you Contact information: [address] [id number] [place] [phone]                  Duration of Discharge Encounter: Greater than 35 minutes   Signed, Earnie Larsson AGACNP-BC  06/27/2022, 11:30 AM

## 2022-06-27 ENCOUNTER — Other Ambulatory Visit (HOSPITAL_COMMUNITY): Payer: Self-pay

## 2022-06-27 DIAGNOSIS — R040 Epistaxis: Secondary | ICD-10-CM | POA: Diagnosis not present

## 2022-06-27 LAB — BASIC METABOLIC PANEL
Anion gap: 7 (ref 5–15)
BUN: 18 mg/dL (ref 8–23)
CO2: 26 mmol/L (ref 22–32)
Calcium: 8.8 mg/dL — ABNORMAL LOW (ref 8.9–10.3)
Chloride: 104 mmol/L (ref 98–111)
Creatinine, Ser: 0.92 mg/dL (ref 0.61–1.24)
GFR, Estimated: >60 mL/min (ref >60)
Glucose, Bld: 117 mg/dL — ABNORMAL HIGH (ref 70–99)
Potassium: 4.1 mmol/L (ref 3.5–5.1)
Sodium: 137 mmol/L (ref 135–145)

## 2022-06-27 LAB — LACTATE DEHYDROGENASE: LDH: 234 U/L — ABNORMAL HIGH (ref 98–192)

## 2022-06-27 LAB — PROTIME-INR
INR: 1.7 — ABNORMAL HIGH (ref 0.8–1.2)
Prothrombin Time: 19.9 seconds — ABNORMAL HIGH (ref 11.4–15.2)

## 2022-06-27 LAB — HEPARIN LEVEL (UNFRACTIONATED): Heparin Unfractionated: 0.18 IU/mL — ABNORMAL LOW (ref 0.30–0.70)

## 2022-06-27 MED ORDER — WARFARIN SODIUM 3 MG PO TABS: 6.0000 mg | ORAL_TABLET | Freq: Once | ORAL | Status: DC

## 2022-06-27 MED ORDER — FUROSEMIDE 20 MG PO TABS: 20.0000 mg | ORAL_TABLET | ORAL | Status: DC

## 2022-06-27 MED ORDER — POTASSIUM CHLORIDE CRYS ER 20 MEQ PO TBCR: 20.0000 meq | EXTENDED_RELEASE_TABLET | Freq: Every day | ORAL | 5 refills | Status: DC

## 2022-06-27 MED ORDER — FUROSEMIDE 40 MG PO TABS
40.0000 mg | ORAL_TABLET | ORAL | Status: DC
  Administered 2022-06-27: 40 mg via ORAL
  Filled 2022-06-27: qty 1

## 2022-06-27 MED ORDER — FUROSEMIDE 20 MG PO TABS: 20.0000 mg | ORAL_TABLET | Freq: Every day | ORAL | 5 refills | Status: DC

## 2022-06-27 MED ORDER — CEPHALEXIN 500 MG PO CAPS
500.0000 mg | ORAL_CAPSULE | Freq: Two times a day (BID) | ORAL | 3 refills | Status: DC
  Filled 2022-06-27: qty 270, 135d supply, fill #0

## 2022-06-27 MED ORDER — WARFARIN SODIUM 3 MG PO TABS
6.0000 mg | ORAL_TABLET | Freq: Once | ORAL | Status: AC
  Administered 2022-06-27: 6 mg via ORAL
  Filled 2022-06-27: qty 2

## 2022-06-27 NOTE — Plan of Care (Signed)
  Problem: Education: Goal: Knowledge of General Education information will improve Description: Including pain rating scale, medication(s)/side effects and non-pharmacologic comfort measures Outcome: Adequate for Discharge   Problem: Health Behavior/Discharge Planning: Goal: Ability to manage health-related needs will improve Outcome: Adequate for Discharge   Problem: Clinical Measurements: Goal: Ability to maintain clinical measurements within normal limits will improve Outcome: Adequate for Discharge Goal: Will remain free from infection Outcome: Adequate for Discharge Goal: Diagnostic test results will improve Outcome: Adequate for Discharge Goal: Respiratory complications will improve Outcome: Adequate for Discharge Goal: Cardiovascular complication will be avoided Outcome: Adequate for Discharge   Problem: Activity: Goal: Risk for activity intolerance will decrease Outcome: Adequate for Discharge   Problem: Nutrition: Goal: Adequate nutrition will be maintained Outcome: Adequate for Discharge   Problem: Coping: Goal: Level of anxiety will decrease Outcome: Adequate for Discharge   Problem: Elimination: Goal: Will not experience complications related to bowel motility Outcome: Adequate for Discharge Goal: Will not experience complications related to urinary retention Outcome: Adequate for Discharge   Problem: Pain Managment: Goal: General experience of comfort will improve Outcome: Adequate for Discharge   Problem: Safety: Goal: Ability to remain free from injury will improve Outcome: Adequate for Discharge   Problem: Skin Integrity: Goal: Risk for impaired skin integrity will decrease Outcome: Adequate for Discharge   Problem: Education: Goal: Patient will understand all VAD equipment and how it functions Outcome: Adequate for Discharge Goal: Patient will be able to verbalize current INR target range and antiplatelet therapy for discharge home Outcome:  Adequate for Discharge   Problem: Cardiac: Goal: LVAD will function as expected and patient will experience no clinical alarms Outcome: Adequate for Discharge   Problem: Education: Goal: Understanding of CV disease, CV risk reduction, and recovery process will improve Outcome: Adequate for Discharge Goal: Individualized Educational Video(s) Outcome: Adequate for Discharge   Problem: Activity: Goal: Ability to return to baseline activity level will improve Outcome: Adequate for Discharge   Problem: Cardiovascular: Goal: Ability to achieve and maintain adequate cardiovascular perfusion will improve Outcome: Adequate for Discharge Goal: Vascular access site(s) Level 0-1 will be maintained Outcome: Adequate for Discharge   Problem: Health Behavior/Discharge Planning: Goal: Ability to safely manage health-related needs after discharge will improve Outcome: Adequate for Discharge

## 2022-06-27 NOTE — Progress Notes (Addendum)
Advanced Heart Failure VAD Team Note  PCP-Cardiologist: None   Subjective:    Underwent bilateral nasal artery embolization (12/22). Nasal packing removed  Remains on heparin '@1000'$ /hr. No further bleeding. Heparin level 0.18 today  Diuresed with IV lasix yesterday. Back down to admission weight. No CP or SOB, ambulating without a problem  LDH 234  INR 1.7  Feels good this morning, up in recliner  LVAD INTERROGATION:  HeartMate II LVAD:   Flow 4.8 liters/min, speed 9200, power 5.5, PI 4.4, x1 PI events   Objective:    Vital Signs:   Temp:  [97.5 F (36.4 C)-98 F (36.7 C)] 97.5 F (36.4 C) (12/27 0340) Pulse Rate:  [79] 79 (12/26 0751) Resp:  [18-20] 20 (12/27 0340) BP: (110)/(99) 110/99 (12/26 0751) SpO2:  [92 %-99 %] 92 % (12/27 0340) Weight:  [83.7 kg] 83.7 kg (12/27 0340) Last BM Date : 06/26/22 Mean arterial Pressure 70-80s  Intake/Output:   Intake/Output Summary (Last 24 hours) at 06/27/2022 0730 Last data filed at 06/27/2022 0000 Gross per 24 hour  Intake 190.59 ml  Output 2115 ml  Net -1924.41 ml     Physical Exam    General:  well appearing.  No respiratory difficulty HEENT: normal Neck: supple. JVD ~8 cm. Carotids 2+ bilat; no bruits. No lymphadenopathy or thyromegaly appreciated. Cor: PMI nondisplaced. Regular rate & rhythm. No rubs, gallops or murmurs. Lungs: clear Abdomen: soft, nontender, nondistended. No hepatosplenomegaly. No bruits or masses. Good bowel sounds. Extremities: no cyanosis, clubbing, rash, trace edema BLE Neuro: alert & oriented x 3, cranial nerves grossly intact. moves all 4 extremities w/o difficulty. Affect pleasant.   Telemetry   AV paced, 80s (Personally reviewed)    Labs   Basic Metabolic Panel: Recent Labs  Lab 06/23/22 0619 06/23/22 0700 06/24/22 0010 06/25/22 0010 06/26/22 0017  NA 136 135 137 139 135  K 4.6 4.8 4.4 5.4* 4.6  CL 104 102 103 103 100  CO2 '23 23 25 30 29  '$ GLUCOSE 139* 164* 115* 111* 126*   BUN '15 17 22 '$ 24* 25*  CREATININE 1.00 1.16 1.09 1.34* 1.23  CALCIUM 8.8* 8.9 8.9 9.2 9.0    Liver Function Tests: Recent Labs  Lab 06/21/22 1826 06/22/22 0458  AST 28 28  ALT 17 16  ALKPHOS 151* 152*  BILITOT 1.4* 1.7*  PROT 7.0 7.1  ALBUMIN 3.3* 3.3*   No results for input(s): "LIPASE", "AMYLASE" in the last 168 hours. No results for input(s): "AMMONIA" in the last 168 hours.  CBC: Recent Labs  Lab 06/21/22 1826 06/22/22 0013 06/22/22 0458 06/23/22 0453 06/23/22 0619 06/23/22 0700 06/24/22 0010 06/25/22 0010 06/26/22 0017  WBC 9.7   < > 8.7 8.7 11.1* 10.4 15.1* 11.3* 10.7*  NEUTROABS 7.7  --  6.8 7.8* 10.0* 9.6*  --   --   --   HGB 11.9*   < > 12.4* 9.6* 11.5* 12.3* 11.4* 11.5* 11.9*  HCT 35.9*   < > 37.4* 31.0* 36.8* 38.7* 34.8* 36.6* 36.8*  MCV 99.4   < > 100.0 104.0* 102.8* 102.7* 99.1 102.5* 100.5*  PLT 205   < > 214 184 252 253 221 241 234   < > = values in this interval not displayed.    INR: Recent Labs  Lab 06/23/22 0700 06/24/22 0010 06/25/22 0010 06/26/22 0017 06/27/22 0027  INR 1.4* 1.3* 1.3* 1.5* 1.7*    Other results:   Imaging   No results found.   Medications:  Scheduled Medications:  amiodarone  100 mg Oral Daily   cephALEXin  500 mg Oral TID   clopidogrel  75 mg Oral Daily   erythromycin   Left Eye Q8H   pantoprazole  40 mg Oral Daily   rosuvastatin  10 mg Oral Q M,W,F   Warfarin - Pharmacist Dosing Inpatient   Does not apply q1600    Infusions:  heparin 1,000 Units/hr (06/26/22 1644)    PRN Medications: acetaminophen **OR** acetaminophen (TYLENOL) oral liquid 160 mg/5 mL **OR** acetaminophen, acetaminophen, cyclobenzaprine, iohexol, ondansetron (ZOFRAN) IV, mouth rinse, traMADol, traZODone   Patient Profile    76 y/o male with h/o CAD with ankylosing spondylitis, DM2, carotid stenting, severe ischemic CM s/p anterior STEM with VT arrest in 12/15, large PE with pulmonary infarct in 2/15 s/p IVC filter and  systolic HF with EF 71% and recent refractory epistaxis.  Admitted for planned nasal artery embolization and management of anticoagulation.  Assessment/Plan:     1. Epistaxis, refractory - has failed management with nasal packing and ENT - s/p bilateral nasal artery embolization by IR 12/22 - No further bleeding. Hgb stable - On heparin at 800u/hr.  - INR 1.7 -> on warfarin. Discussed dosing with PharmD personally. - With HM-II, continue heparin 1,000u/hr   2. Chronic systolic HF: Ischemic cardiomyopathy, EF 20% s/p HMII LVAD 09/20/13.  - Stable NYHA II - MAPs ok  - Volume stable, s/p 40 IV lasix yesterday. Weight down to admission weight. Will restart home regimen, alternating 40/20 every other day PO lasix  3. Esophageal stricture - symptomatically improved with cutting food up - Barium swallow 7/23 suggestive of stricture at [place] + esophageal dysmotility - has seen GI. Discussed EGD but he wants to defer for now as symptoms improved - reminded him to be vigilant about cutting/chewing his food well  - No change   4. VT/VF c/b CHB - much improved with CRT-D with LBBB lead 5/23 - no further VT - Continue amio at 200 daily - keep K > 4.0. Mg > 2.0   5. CAD s/p Anterior STEMI on 06/12/14 with stenting of LAD:  - No s/s ischemia - ASA on hold with epistaxis. Resume later - Previously stopped statin but now back on crestor 3x/week. No change   6. DL infection, chronic - Site looks good  - Remains on suppressive Keflex - Continue weekly dressing changes   7. VAD management:  - Had admit in 08/2017 for elevated LDH.  - Off asa with recent epistaxis. Holding coumadin for planned procedure.  - Goal INR 2.0-2.5. INR 1.7 this am.  - LDH ok - With HM II continue heparin at 1,000  - Back on warfarin. Dosing d/w PharmD - Continue Keflex as above for DL - VAD interrogated personally. Parameters stable.   8. Ankylosing spondylitis: Off NSAIDS. -  No longer following with  rheumatology. Uses tramadol. Avoids NSAIDs as much as possible. -  No change   9. HTN:  - Keep MAP 70-90.    10.  Kidney Stones  - CT with renal calculi bilaterally. Had some previous hematuria. None recently  Now followed by Dr Tresa Moore at [facility].  11. L eye drainage - Continue erythromycin ointment - resolved   I reviewed the LVAD parameters from today, and compared the results to the patient's prior recorded data.  No programming changes were made.  The LVAD is functioning within specified parameters.  The patient performs LVAD self-test daily.  LVAD interrogation was negative  for any significant power changes, alarms or PI events/speed drops.  LVAD equipment check completed and is in good working order.  Back-up equipment present.   LVAD education done on emergency procedures and precautions and reviewed exit site care.  Length of Stay: [place], NP 06/27/2022, 7:30 AM  VAD Team --- VAD ISSUES ONLY--- Pager [phone] (7am - 7am)  Advanced Heart Failure Team  Pager [phone] (M-F; 7a - 5p)  Please contact [facility] for night-coverage after hours (5p -7a ) and weekends on [email or web address]   Patient seen and examined with the above-signed Advanced Practice Provider and/or Housestaff. I personally reviewed laboratory data, imaging studies and relevant notes. I independently examined the patient and formulated the important aspects of the plan. I have edited the note to reflect any of my changes or salient points. I have personally discussed the plan with the patient and/or family.  Diuresed well with IV lasix. Weight back down to baseline. On heparin/warfarin. INR 1.7 (and climbing). No further bleeding. No CP or SOB  General:  NAD.  HEENT: normal  Neck: supple. JVP not elevated.  Carotids 2+ bilat; no bruits. No lymphadenopathy or thryomegaly appreciated. Cor: LVAD hum.  Lungs: Clear. Abdomen: soft, nontender, non-distended. No hepatosplenomegaly. No bruits or masses.  Good bowel sounds. Driveline site clean. Anchor in place.  Extremities: no cyanosis, clubbing, rash. Warm no edema  Neuro: alert & oriented x 3. No focal deficits. Moves all 4 without problem   Volume status much improved. On heparin/warfarin. INR 1.7 (and climbing)  Ok to d/c home today. Warfarin dosing d/w PharmD.   VAD interrogated personally. Parameters stable.  [name], MD  8:55 AM

## 2022-06-27 NOTE — Progress Notes (Addendum)
LVAD Coordinator Rounding Note:  Admitted 06/21/22 to Dr. Haroldine Laws due to need for nasal artery embolization in IR.   HM II LVAD implanted on 09/21/14 by Dr. Darcey Nora under Destination Therapy criteria.  Pt sitting in chair on my arrival. Denies complaints at this time. States he is doing well post surgery and has had no recurrent bleeding. Would like to go home today. INR 1.7.   Spoke with pt's wife regarding the following appts: INR appt 07/04/22. VAD clinic f/u 07/23/22 at 0900. Instructed to page VAD coordinator if recurrent bleeding or issues at home. She verbalized understanding.   Vital signs: Temp: 97.7 HR: AV paced 80 Doppler Pressure: 82 Automatic BP: 92/72 (80) O2 Sat: 99% on RA Wt: 184.3>182.1>189.7>184.4>186.9>185.4>184.5 lbs    LVAD interrogation reveals:  Speed: 9200 Flow: 4.7 Power: 5.2 w PI: 4.9  Alarms: none Events: none  Fixed speed: 9200 Low speed limit: 8600   Drive Line:  Dressing clean, dry, and intact. Anchor correctly applied. Wife says she wants to do dressing changes at this time. Dressing change per bedside RN or patient's wife [name]. Dressing change due 06/28/22 .   Labs:  LDH trend: 246>220>227>219>211>235>235  INR trend: 1.6>1.5>1.4>1.4>1.3>1.5>1.7  Anticoagulation Plan: -INR Goal:  2.0 - 3.0 -ASA Dose: 81 mg daily--on hold with nosebleeds -Plavix: restarted 06/22/22 -Heparin 1000 u/hr  Blood Products:    Device: N/A  Arrythmias:  - 5/12//23 admitted with VT>>emergent DC-CV  - 11/10/21 complete heart block/bradycardia >>started on Dopamine gtt - 5/15 Recurrent VT -> DC-CV - 5/16 Recurrent VT-->DC-CV/ SP Dual Chamber MDT ICD   Drips: Heparin 1000 units/hr   Plan/Recommendations:  Call VAD pager if any issues with VAD equipment or drive line site. Weekly dressing changes per pt's wife or bedside RN. For discharge home today per Dr Haroldine Laws. Follow up appts at documented above.   Emerson Monte RN [place] Coordinator  Office:  [phone]  24/7 Pager: [phone]

## 2022-06-27 NOTE — Plan of Care (Signed)
  Problem: Education: Goal: Knowledge of General Education information will improve Description: Including pain rating scale, medication(s)/side effects and non-pharmacologic comfort measures Outcome: Progressing   Problem: Health Behavior/Discharge Planning: Goal: Ability to manage health-related needs will improve Outcome: Progressing   Problem: Clinical Measurements: Goal: Ability to maintain clinical measurements within normal limits will improve Outcome: Progressing Goal: Will remain free from infection Outcome: Progressing Goal: Diagnostic test results will improve Outcome: Progressing Goal: Respiratory complications will improve Outcome: Progressing Goal: Cardiovascular complication will be avoided Outcome: Progressing   Problem: Activity: Goal: Risk for activity intolerance will decrease Outcome: Progressing   Problem: Nutrition: Goal: Adequate nutrition will be maintained Outcome: Progressing   Problem: Coping: Goal: Level of anxiety will decrease Outcome: Progressing   Problem: Elimination: Goal: Will not experience complications related to bowel motility Outcome: Progressing Goal: Will not experience complications related to urinary retention Outcome: Progressing   Problem: Pain Managment: Goal: General experience of comfort will improve Outcome: Progressing   Problem: Safety: Goal: Ability to remain free from injury will improve Outcome: Progressing   Problem: Skin Integrity: Goal: Risk for impaired skin integrity will decrease Outcome: Progressing   Problem: Education: Goal: Patient will understand all VAD equipment and how it functions Outcome: Progressing Goal: Patient will be able to verbalize current INR target range and antiplatelet therapy for discharge home Outcome: Progressing   Problem: Cardiac: Goal: LVAD will function as expected and patient will experience no clinical alarms Outcome: Progressing   Problem: Education: Goal:  Understanding of CV disease, CV risk reduction, and recovery process will improve Outcome: Progressing Goal: Individualized Educational Video(s) Outcome: Progressing   Problem: Activity: Goal: Ability to return to baseline activity level will improve Outcome: Progressing   Problem: Cardiovascular: Goal: Ability to achieve and maintain adequate cardiovascular perfusion will improve Outcome: Progressing Goal: Vascular access site(s) Level 0-1 will be maintained Outcome: Progressing   Problem: Health Behavior/Discharge Planning: Goal: Ability to safely manage health-related needs after discharge will improve Outcome: Progressing

## 2022-06-27 NOTE — Progress Notes (Signed)
ANTICOAGULATION CONSULT NOTE - Follow-up Consult  Pharmacy Consult for heparin, warfarin Indication:  LVAD, history of PE  Allergies  Allergen Reactions   Biopatch [Chlorhexidine] Dermatitis and Rash   Desyrel [Trazodone] Other (See Comments)    insomnia   Other Dermatitis    Adhesive    Patient Measurements: Height: '5\' 10"'$  (177.8 cm) Weight: 83.7 kg (184 lb 8.4 oz) IBW/kg (Calculated) : 73 Heparin Dosing Weight: 84kg  Vital Signs: Temp: 97.7 F (36.5 C) (12/27 0819) Temp Source: Oral (12/27 0819) BP: 92/72 (12/27 0741) Pulse Rate: 70 (12/27 0741)  Labs: Recent Labs    06/25/22 0010 06/26/22 0017 06/27/22 0027  HGB 11.5* 11.9*  --   HCT 36.6* 36.8*  --   PLT 241 234  --   LABPROT 16.3* 18.2* 19.9*  INR 1.3* 1.5* 1.7*  HEPARINUNFRC <0.10* 0.10* 0.18*  CREATININE 1.34* 1.23  --      Estimated Creatinine Clearance: 54.4 mL/min (by C-G formula based on SCr of 1.23 mg/dL).  Assessment: 76 year old male with HM2 LVAD [facility] to [facility] as a direct admit for heparin bridge now s/p nasal artery embolization. Plavix resumed and nasal packing removed. No bleeding noted this AM.  -INR 1.7 trending up with boost doses warfarin but not quite to goal   -Heparin level 0.18 on heparin drip 1000 uts/hr  No bleeding noted cbc stable   Home warfarin dose: '3mg'$ /day except take '6mg'$  on MF   Goal of Therapy:  INR goal 2-2.5 Heparin level < 0.3 Monitor platelets by anticoagulation protocol: Yes   Plan:  Possible dc home today  Stop heparin drip at DC Warfarin '6mg'$  today then resume PTA warfarin dose '6mg'$  MW and '3mg'$  all other days  -Daily PT/INR   Bonnita Nasuti Pharm.D. CPP, BCPS Clinical Pharmacist [phone] 06/27/2022 8:31 AM

## 2022-06-28 ENCOUNTER — Other Ambulatory Visit (HOSPITAL_COMMUNITY): Payer: Self-pay | Admitting: *Deleted

## 2022-06-28 DIAGNOSIS — R040 Epistaxis: Secondary | ICD-10-CM

## 2022-06-28 DIAGNOSIS — Z95811 Presence of heart assist device: Secondary | ICD-10-CM

## 2022-06-28 DIAGNOSIS — Z7901 Long term (current) use of anticoagulants: Secondary | ICD-10-CM

## 2022-06-29 LAB — CUP PACEART REMOTE DEVICE CHECK
Battery Remaining Longevity: 77 mo
Battery Voltage: 3.01 V
Brady Statistic AP VP Percent: 98.44 %
Brady Statistic AP VS Percent: 0.06 %
Brady Statistic AS VP Percent: 1.47 %
Brady Statistic AS VS Percent: 0.03 %
Brady Statistic RA Percent Paced: 98.37 %
Brady Statistic RV Percent Paced: 99.7 %
Date Time Interrogation Session: 20231229012204
HighPow Impedance: 69 Ohm
Implantable Lead Connection Status: 753985
Implantable Lead Connection Status: 753985
Implantable Lead Connection Status: 753985
Implantable Lead Implant Date: 20230516
Implantable Lead Implant Date: 20230516
Implantable Lead Implant Date: 20230516
Implantable Lead Location: 753859
Implantable Lead Location: 753860
Implantable Lead Location: 753860
Implantable Lead Model: 3830
Implantable Lead Model: 5076
Implantable Pulse Generator Implant Date: 20230516
Lead Channel Impedance Value: 266 Ohm
Lead Channel Impedance Value: 323 Ohm
Lead Channel Impedance Value: 342 Ohm
Lead Channel Impedance Value: 399 Ohm
Lead Channel Impedance Value: 437 Ohm
Lead Channel Impedance Value: 570 Ohm
Lead Channel Pacing Threshold Amplitude: 0.625 V
Lead Channel Pacing Threshold Amplitude: 0.75 V
Lead Channel Pacing Threshold Pulse Width: 0.4 ms
Lead Channel Pacing Threshold Pulse Width: 0.4 ms
Lead Channel Sensing Intrinsic Amplitude: 1.125 mV
Lead Channel Sensing Intrinsic Amplitude: 1.125 mV
Lead Channel Sensing Intrinsic Amplitude: 7.625 mV
Lead Channel Setting Pacing Amplitude: 0.5 V
Lead Channel Setting Pacing Amplitude: 2.5 V
Lead Channel Setting Pacing Amplitude: 2.75 V
Lead Channel Setting Pacing Pulse Width: 0.03 ms
Lead Channel Setting Pacing Pulse Width: 0.4 ms
Lead Channel Setting Sensing Sensitivity: 0.3 mV
Zone Setting Status: 755011

## 2022-07-04 ENCOUNTER — Ambulatory Visit (HOSPITAL_COMMUNITY)
Admit: 2022-07-04 | Discharge: 2022-07-04 | Disposition: A | Discharge status: Home / Self Care | Payer: Medicare Other | Attending: Cardiology | Admitting: Cardiology

## 2022-07-04 ENCOUNTER — Ambulatory Visit (HOSPITAL_COMMUNITY): Payer: Self-pay | Admitting: Pharmacist

## 2022-07-04 DIAGNOSIS — Z95811 Presence of heart assist device: Secondary | ICD-10-CM | POA: Insufficient documentation

## 2022-07-04 DIAGNOSIS — Z7901 Long term (current) use of anticoagulants: Secondary | ICD-10-CM | POA: Diagnosis not present

## 2022-07-04 LAB — PROTIME-INR
INR: 2.9 — ABNORMAL HIGH (ref 0.8–1.2)
Prothrombin Time: 29.8 seconds — ABNORMAL HIGH (ref 11.4–15.2)

## 2022-07-04 LAB — LACTATE DEHYDROGENASE: LDH: 261 U/L — ABNORMAL HIGH (ref 98–192)

## 2022-07-05 ENCOUNTER — Telehealth (HOSPITAL_COMMUNITY): Payer: Self-pay | Admitting: Unknown Physician Specialty

## 2022-07-05 MED ORDER — AMIODARONE HCL 200 MG PO TABS: 100.0000 mg | ORAL_TABLET | ORAL | 3 refills | Status: DC

## 2022-07-05 NOTE — Telephone Encounter (Addendum)
Received call from pts wife [name] stating that Scott [name] cannot sleep. They both feel that the Amiodarone is causing his sleep issues. [name] states that they are going to stop the Amiodarone for "4 days" to see if this change improves his sleep. Updated DR Bensimhon with the above. Dr Haroldine Laws would like for the pt to decrease the Amiodarone to 100 mg every other day. I spoke with [name] who will make this change. [name] also states that a sleep study has been mentioned in the past. She tells me that she doesn't think Scott [name] will do a sleep study. I informred [name] that we can discuss this at his clinic visit next week if decreasing the Amiodarone doesn't help.  [name] RN, BSN VAD Coordinator 24/7 Pager [phone]

## 2022-07-07 DIAGNOSIS — L0232 Furuncle of buttock: Secondary | ICD-10-CM | POA: Diagnosis not present

## 2022-07-09 ENCOUNTER — Other Ambulatory Visit (HOSPITAL_COMMUNITY): Payer: Self-pay | Admitting: *Deleted

## 2022-07-09 ENCOUNTER — Telehealth (HOSPITAL_COMMUNITY): Payer: Self-pay | Admitting: *Deleted

## 2022-07-09 NOTE — Telephone Encounter (Signed)
Received message from patient's wife reporting pt has a boil on his butt cheek. He was seen at urgent care for this over the weekend and was prescribed Clindamycin 300 mg QID x 1 week. Reports boil ruptured Saturday night and is draining old blood. [name] is cleansing area with CHG swab twice a day and covering with gauze dressing.   [place] PharmD notified of antibiotic.   Emerson Monte RN [place] Coordinator  Office: [phone]  24/7 Pager: [phone]

## 2022-07-20 ENCOUNTER — Other Ambulatory Visit (HOSPITAL_COMMUNITY): Payer: Self-pay | Admitting: Unknown Physician Specialty

## 2022-07-20 DIAGNOSIS — Z7901 Long term (current) use of anticoagulants: Secondary | ICD-10-CM

## 2022-07-20 DIAGNOSIS — Z95811 Presence of heart assist device: Secondary | ICD-10-CM

## 2022-07-22 ENCOUNTER — Telehealth (HOSPITAL_COMMUNITY): Payer: Self-pay | Admitting: *Deleted

## 2022-07-23 ENCOUNTER — Encounter (HOSPITAL_COMMUNITY): Payer: Medicare Other | Admitting: Internal Medicine

## 2022-07-23 NOTE — Telephone Encounter (Signed)
Call placed to Sarah with [place] [facility].  No therapies noted on last transmission received 07/10/2022 at 9:00 am.

## 2022-07-23 NOTE — Telephone Encounter (Signed)
Sarah from [place] [facility] called in today wanting to know if patient received a shock before he passed away. Patient direct number is [phone]

## 2022-08-02 ENCOUNTER — Ambulatory Visit: Payer: Medicare Other | Admitting: Gastroenterology

## 2022-08-02 NOTE — Telephone Encounter (Signed)
Received page from [name] at 7:09 this morning stating that [name] was unresponsive, not breathing, and his VAD was alarming LOW FLOW. She immediately called EMS.  Reports pt on wall power, and the pump was on/running. VAD #s: speed: 9200 Flow: --- Power: 4.1 w PI: 2.4   [name] reports pt hadn't been feeling well (tired, weak, chills) since Friday but they did not call VAD team because he was scheduled for a clinic appt on Monday. She states he had been sleeping in one of the upstairs bedrooms since it was warmer upstairs, and she was sleeping in their bedroom downstairs. When she woke up at 7:00 she went upstairs to check on him and found him laying on the bed, alarming, unresponsive, and not breathing. She immediately called 911.   When EMS arrived they placed him on the monitor and found he was in asystole. Discussed with Dr Haroldine Laws, and decision was made with Jane's permission to turn off pt's VAD. [name] preferred pt stay at home vs coming to ER for pump deactivation. VAD coordinator instructed EMS Randall Hiss) how to turn off pt's pump. Pump deactivation completed.   Jane sent a remote transmission from pt's ICD for further information. I expressed the heart failure team's condolences for her loss, and provided emotional support.   Emerson Monte RN [place] Coordinator  Office: [phone]  24/7 Pager: [phone]

## 2022-08-02 DEATH — deceased

## 2022-08-17 ENCOUNTER — Encounter (HOSPITAL_COMMUNITY): Payer: Medicare Other | Admitting: Internal Medicine
